# Patient Record
Sex: Female | Born: 1960 | Race: White | Hispanic: No | Smoking: Former smoker
Health system: Southern US, Community
[De-identification: ages and names within clinical notes are randomized; demographics above are authoritative.]

## PROBLEM LIST (undated history)

## (undated) ENCOUNTER — Ambulatory Visit: Payer: PRIVATE HEALTH INSURANCE | Attending: Podiatrist | Primary: Podiatrist

## (undated) ENCOUNTER — Encounter: Attending: Family | Primary: Family

## (undated) ENCOUNTER — Ambulatory Visit: Payer: MEDICAID

## (undated) ENCOUNTER — Encounter

## (undated) ENCOUNTER — Ambulatory Visit: Payer: Medicaid (Managed Care) | Attending: Foot & Ankle Surgery | Primary: Foot & Ankle Surgery

## (undated) ENCOUNTER — Ambulatory Visit: Payer: PRIVATE HEALTH INSURANCE | Attending: Family | Primary: Family

## (undated) ENCOUNTER — Ambulatory Visit: Payer: Medicaid (Managed Care)

## (undated) ENCOUNTER — Encounter: Attending: Urology | Primary: Urology

## (undated) ENCOUNTER — Encounter: Attending: Rheumatology | Primary: Rheumatology

## (undated) ENCOUNTER — Telehealth

## (undated) ENCOUNTER — Ambulatory Visit: Payer: MEDICAID | Attending: Foot & Ankle Surgery | Primary: Foot & Ankle Surgery

## (undated) ENCOUNTER — Ambulatory Visit

## (undated) ENCOUNTER — Telehealth: Payer: MEDICAID

## (undated) ENCOUNTER — Ambulatory Visit: Payer: MEDICAID | Attending: Rheumatology | Primary: Rheumatology

## (undated) ENCOUNTER — Telehealth: Attending: Rheumatology | Primary: Rheumatology

## (undated) ENCOUNTER — Inpatient Hospital Stay

## (undated) ENCOUNTER — Ambulatory Visit: Payer: Medicaid (Managed Care) | Attending: Podiatrist | Primary: Podiatrist

## (undated) ENCOUNTER — Ambulatory Visit: Payer: Medicaid (Managed Care) | Attending: Rheumatology | Primary: Rheumatology

## (undated) ENCOUNTER — Telehealth
Attending: Student in an Organized Health Care Education/Training Program | Primary: Student in an Organized Health Care Education/Training Program

## (undated) ENCOUNTER — Ambulatory Visit: Payer: PRIVATE HEALTH INSURANCE | Attending: Hematology & Oncology | Primary: Hematology & Oncology

## (undated) ENCOUNTER — Ambulatory Visit: Payer: PRIVATE HEALTH INSURANCE

## (undated) ENCOUNTER — Ambulatory Visit: Payer: MEDICAID | Attending: Surgery | Primary: Surgery

## (undated) ENCOUNTER — Telehealth: Attending: Family | Primary: Family

## (undated) ENCOUNTER — Encounter: Attending: Internal Medicine | Primary: Internal Medicine

## (undated) ENCOUNTER — Ambulatory Visit
Payer: PRIVATE HEALTH INSURANCE | Attending: Student in an Organized Health Care Education/Training Program | Primary: Student in an Organized Health Care Education/Training Program

## (undated) ENCOUNTER — Ambulatory Visit: Payer: MEDICAID | Attending: Family | Primary: Family

## (undated) ENCOUNTER — Ambulatory Visit: Payer: MEDICAID | Attending: Adult Health | Primary: Adult Health

## (undated) ENCOUNTER — Encounter: Attending: Foot & Ankle Surgery | Primary: Foot & Ankle Surgery

## (undated) ENCOUNTER — Encounter
Attending: Student in an Organized Health Care Education/Training Program | Primary: Student in an Organized Health Care Education/Training Program

## (undated) ENCOUNTER — Ambulatory Visit: Payer: MEDICAID | Attending: Nurse Practitioner | Primary: Nurse Practitioner

## (undated) ENCOUNTER — Other Ambulatory Visit

## (undated) ENCOUNTER — Encounter
Attending: Pharmacist Clinician (PhC)/ Clinical Pharmacy Specialist | Primary: Pharmacist Clinician (PhC)/ Clinical Pharmacy Specialist

## (undated) ENCOUNTER — Ambulatory Visit: Payer: MEDICAID | Attending: Urology | Primary: Urology

## (undated) ENCOUNTER — Telehealth: Attending: Family Medicine | Primary: Family Medicine

## (undated) ENCOUNTER — Ambulatory Visit
Payer: Medicaid (Managed Care) | Attending: Rehabilitative and Restorative Service Providers" | Primary: Rehabilitative and Restorative Service Providers"

## (undated) ENCOUNTER — Encounter: Attending: Podiatrist | Primary: Podiatrist

## (undated) ENCOUNTER — Ambulatory Visit
Payer: Medicaid (Managed Care) | Attending: Student in an Organized Health Care Education/Training Program | Primary: Student in an Organized Health Care Education/Training Program

## (undated) ENCOUNTER — Telehealth: Attending: Foot & Ankle Surgery | Primary: Foot & Ankle Surgery

## (undated) ENCOUNTER — Ambulatory Visit: Payer: Medicaid (Managed Care) | Attending: "Endocrinology | Primary: "Endocrinology

## (undated) ENCOUNTER — Ambulatory Visit: Payer: MEDICAID | Attending: Internal Medicine | Primary: Internal Medicine

## (undated) ENCOUNTER — Encounter: Payer: Medicaid (Managed Care) | Attending: Family | Primary: Family

## (undated) ENCOUNTER — Telehealth: Payer: MEDICAID | Attending: Rheumatology | Primary: Rheumatology

## (undated) ENCOUNTER — Ambulatory Visit: Payer: Medicaid (Managed Care) | Attending: Family | Primary: Family

## (undated) ENCOUNTER — Ambulatory Visit
Payer: MEDICAID | Attending: Student in an Organized Health Care Education/Training Program | Primary: Student in an Organized Health Care Education/Training Program

## (undated) ENCOUNTER — Ambulatory Visit: Payer: PRIVATE HEALTH INSURANCE | Attending: "Endocrinology | Primary: "Endocrinology

## (undated) ENCOUNTER — Ambulatory Visit: Payer: MEDICAID | Attending: Vascular Surgery | Primary: Vascular Surgery

## (undated) ENCOUNTER — Telehealth: Attending: Ambulatory Care | Primary: Ambulatory Care

## (undated) ENCOUNTER — Inpatient Hospital Stay: Payer: Medicaid (Managed Care)

## (undated) ENCOUNTER — Encounter: Attending: Vascular Surgery | Primary: Vascular Surgery

## (undated) ENCOUNTER — Other Ambulatory Visit: Payer: Medicaid (Managed Care)

## (undated) ENCOUNTER — Ambulatory Visit: Payer: MEDICAID | Attending: "Endocrinology | Primary: "Endocrinology

## (undated) ENCOUNTER — Ambulatory Visit: Payer: MEDICAID | Attending: Registered" | Primary: Registered"

## (undated) ENCOUNTER — Telehealth: Attending: Internal Medicine | Primary: Internal Medicine

## (undated) ENCOUNTER — Ambulatory Visit: Payer: MEDICAID | Attending: Infectious Disease | Primary: Infectious Disease

## (undated) ENCOUNTER — Encounter: Attending: Nutritionist | Primary: Nutritionist

## (undated) DIAGNOSIS — F331 Major depressive disorder, recurrent, moderate: Secondary | ICD-10-CM

## (undated) DIAGNOSIS — K449 Diaphragmatic hernia without obstruction or gangrene: Secondary | ICD-10-CM

## (undated) DIAGNOSIS — E669 Obesity, unspecified: Secondary | ICD-10-CM

## (undated) DIAGNOSIS — K439 Ventral hernia without obstruction or gangrene: Secondary | ICD-10-CM

## (undated) DIAGNOSIS — N319 Neuromuscular dysfunction of bladder, unspecified: Secondary | ICD-10-CM

## (undated) DIAGNOSIS — U071 COVID-19: Secondary | ICD-10-CM

## (undated) DIAGNOSIS — K219 Gastro-esophageal reflux disease without esophagitis: Secondary | ICD-10-CM

## (undated) DIAGNOSIS — I5032 Chronic diastolic (congestive) heart failure: Secondary | ICD-10-CM

## (undated) DIAGNOSIS — E876 Hypokalemia: Secondary | ICD-10-CM

## (undated) DIAGNOSIS — E039 Hypothyroidism, unspecified: Secondary | ICD-10-CM

## (undated) DIAGNOSIS — I10 Essential (primary) hypertension: Secondary | ICD-10-CM

## (undated) DIAGNOSIS — F32A Depression, unspecified: Secondary | ICD-10-CM

## (undated) DIAGNOSIS — R609 Edema, unspecified: Secondary | ICD-10-CM

## (undated) DIAGNOSIS — A0472 Enterocolitis due to Clostridium difficile, not specified as recurrent: Secondary | ICD-10-CM

## (undated) DIAGNOSIS — F410 Panic disorder [episodic paroxysmal anxiety] without agoraphobia: Secondary | ICD-10-CM

## (undated) DIAGNOSIS — J1282 Pneumonia due to coronavirus disease 2019: Secondary | ICD-10-CM

## (undated) DIAGNOSIS — A4902 Methicillin resistant Staphylococcus aureus infection, unspecified site: Secondary | ICD-10-CM

## (undated) DIAGNOSIS — G629 Polyneuropathy, unspecified: Secondary | ICD-10-CM

## (undated) DIAGNOSIS — F329 Major depressive disorder, single episode, unspecified: Secondary | ICD-10-CM

## (undated) DIAGNOSIS — F419 Anxiety disorder, unspecified: Secondary | ICD-10-CM

## (undated) DIAGNOSIS — G4730 Sleep apnea, unspecified: Secondary | ICD-10-CM

## (undated) DIAGNOSIS — K589 Irritable bowel syndrome without diarrhea: Secondary | ICD-10-CM

## (undated) DIAGNOSIS — I5030 Unspecified diastolic (congestive) heart failure: Secondary | ICD-10-CM

## (undated) DIAGNOSIS — M069 Rheumatoid arthritis, unspecified: Secondary | ICD-10-CM

## (undated) DIAGNOSIS — M1990 Unspecified osteoarthritis, unspecified site: Secondary | ICD-10-CM

## (undated) HISTORY — DX: Pneumonia due to coronavirus disease 2019: J12.82

## (undated) HISTORY — DX: Methicillin resistant Staphylococcus aureus infection, unspecified site: A49.02

## (undated) HISTORY — PX: ABDOMINAL HYSTERECTOMY: SHX81

## (undated) HISTORY — PX: EYE SURGERY: SHX253

## (undated) HISTORY — DX: Unspecified diastolic (congestive) heart failure: I50.30

## (undated) HISTORY — DX: Rheumatoid arthritis, unspecified: M06.9

## (undated) HISTORY — PX: TUBAL LIGATION: SHX77

## (undated) HISTORY — DX: Major depressive disorder, recurrent, moderate: F33.1

## (undated) HISTORY — DX: Gastro-esophageal reflux disease without esophagitis: K21.9

## (undated) HISTORY — DX: Enterocolitis due to Clostridium difficile, not specified as recurrent: A04.72

## (undated) HISTORY — DX: Neuromuscular dysfunction of bladder, unspecified: N31.9

## (undated) HISTORY — DX: Ventral hernia without obstruction or gangrene: K43.9

## (undated) HISTORY — DX: Depression, unspecified: F32.A

## (undated) HISTORY — DX: Morbid (severe) obesity due to excess calories: E66.01

## (undated) HISTORY — DX: Diaphragmatic hernia without obstruction or gangrene: K44.9

## (undated) HISTORY — DX: Panic disorder (episodic paroxysmal anxiety): F41.0

## (undated) HISTORY — PX: HERNIA REPAIR: SHX51

## (undated) HISTORY — DX: Polyneuropathy, unspecified: G62.9

## (undated) HISTORY — PX: CHOLECYSTECTOMY: SHX55

## (undated) HISTORY — DX: Sleep apnea, unspecified: G47.30

## (undated) HISTORY — DX: Unspecified osteoarthritis, unspecified site: M19.90

## (undated) HISTORY — DX: Chronic diastolic (congestive) heart failure: I50.32

## (undated) HISTORY — DX: COVID-19: U07.1

## (undated) HISTORY — DX: Essential (primary) hypertension: I10

## (undated) HISTORY — DX: Irritable bowel syndrome, unspecified: K58.9

## (undated) HISTORY — DX: Major depressive disorder, single episode, unspecified: F32.9

## (undated) HISTORY — DX: Hypokalemia: E87.6

## (undated) HISTORY — DX: Edema, unspecified: R60.9

## (undated) HISTORY — DX: Anxiety disorder, unspecified: F41.9

## (undated) HISTORY — DX: Obesity, unspecified: E66.9

## (undated) HISTORY — PX: TONSILLECTOMY: SUR1361

## (undated) MED ORDER — HELIUM-OXYGEN INHL: NASAL | 0.00000 days | Status: SS

## (undated) MED ORDER — PRAMIPEXOLE 0.125 MG TABLET: ORAL | 0.00000 days

## (undated) MED ORDER — INSULIN LISPRO (U-100) 100 UNIT/ML SUBCUTANEOUS SOLUTION: Freq: Three times a day (TID) | SUBCUTANEOUS | 0.00000 days

## (undated) MED ORDER — CALCIUM CITRATE 315 MG-VITAMIN D3 5 MCG (200 UNIT) TABLET: Freq: Two times a day (BID) | ORAL | 0 days | Status: SS

## (undated) MED ORDER — OXYCODONE 5 MG TABLET: Freq: Four times a day (QID) | ORAL | 0 days | Status: SS | PRN

## (undated) MED ORDER — VANCOMYCIN IVPB IN 100 ML: INTRAVENOUS | 0.00000 days | Status: SS

---
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
## 2004-04-08 ENCOUNTER — Ambulatory Visit: Payer: Self-pay | Admitting: Urology

## 2004-06-03 DIAGNOSIS — F331 Major depressive disorder, recurrent, moderate: Secondary | ICD-10-CM

## 2004-06-03 HISTORY — DX: Major depressive disorder, recurrent, moderate: F33.1

## 2004-07-07 ENCOUNTER — Inpatient Hospital Stay: Payer: Self-pay | Admitting: Obstetrics & Gynecology

## 2004-11-12 ENCOUNTER — Other Ambulatory Visit: Payer: Self-pay

## 2004-11-19 ENCOUNTER — Ambulatory Visit: Payer: Self-pay | Admitting: Urology

## 2004-12-03 ENCOUNTER — Ambulatory Visit: Payer: Self-pay | Admitting: Urology

## 2004-12-27 ENCOUNTER — Emergency Department: Payer: Self-pay | Admitting: Emergency Medicine

## 2005-01-14 ENCOUNTER — Ambulatory Visit: Payer: Self-pay | Admitting: Urology

## 2005-01-26 ENCOUNTER — Emergency Department: Payer: Self-pay | Admitting: Emergency Medicine

## 2005-01-27 DIAGNOSIS — I10 Essential (primary) hypertension: Secondary | ICD-10-CM | POA: Insufficient documentation

## 2005-03-29 DIAGNOSIS — E669 Obesity, unspecified: Secondary | ICD-10-CM | POA: Insufficient documentation

## 2005-08-17 ENCOUNTER — Ambulatory Visit (HOSPITAL_COMMUNITY): Admission: RE | Admit: 2005-08-17 | Discharge: 2005-08-17 | Payer: Self-pay | Admitting: Urology

## 2005-10-08 ENCOUNTER — Ambulatory Visit: Payer: Self-pay | Admitting: Internal Medicine

## 2006-06-24 ENCOUNTER — Ambulatory Visit (HOSPITAL_COMMUNITY): Admission: RE | Admit: 2006-06-24 | Discharge: 2006-06-24 | Payer: Self-pay | Admitting: Internal Medicine

## 2006-07-01 ENCOUNTER — Emergency Department: Payer: Self-pay | Admitting: Emergency Medicine

## 2006-07-05 ENCOUNTER — Ambulatory Visit: Payer: Self-pay | Admitting: Internal Medicine

## 2006-08-11 ENCOUNTER — Ambulatory Visit: Payer: Self-pay | Admitting: Internal Medicine

## 2006-10-18 ENCOUNTER — Ambulatory Visit (HOSPITAL_COMMUNITY): Admission: RE | Admit: 2006-10-18 | Discharge: 2006-10-18 | Payer: Self-pay | Admitting: Surgery

## 2006-10-25 ENCOUNTER — Encounter: Admission: RE | Admit: 2006-10-25 | Discharge: 2007-01-23 | Payer: Self-pay | Admitting: Surgery

## 2006-10-31 ENCOUNTER — Ambulatory Visit (HOSPITAL_COMMUNITY): Admission: RE | Admit: 2006-10-31 | Discharge: 2006-10-31 | Payer: Self-pay | Admitting: Surgery

## 2006-11-09 ENCOUNTER — Encounter: Admission: RE | Admit: 2006-11-09 | Discharge: 2006-11-09 | Payer: Self-pay | Admitting: Surgery

## 2007-03-08 ENCOUNTER — Encounter: Admission: RE | Admit: 2007-03-08 | Discharge: 2007-06-06 | Payer: Self-pay | Admitting: Surgery

## 2007-03-20 ENCOUNTER — Ambulatory Visit (HOSPITAL_COMMUNITY): Admission: RE | Admit: 2007-03-20 | Discharge: 2007-03-21 | Payer: Self-pay | Admitting: Surgery

## 2007-03-20 HISTORY — PX: LAPAROSCOPIC GASTRIC BANDING: SHX1100

## 2007-03-22 ENCOUNTER — Inpatient Hospital Stay (HOSPITAL_COMMUNITY): Admission: EM | Admit: 2007-03-22 | Discharge: 2007-03-27 | Payer: Self-pay | Admitting: Surgery

## 2007-05-04 ENCOUNTER — Ambulatory Visit: Payer: Self-pay | Admitting: Internal Medicine

## 2007-05-25 ENCOUNTER — Encounter: Admission: RE | Admit: 2007-05-25 | Discharge: 2007-05-25 | Payer: Self-pay | Admitting: Surgery

## 2007-09-29 ENCOUNTER — Ambulatory Visit: Payer: Self-pay | Admitting: Family Medicine

## 2007-10-04 ENCOUNTER — Ambulatory Visit: Payer: Self-pay | Admitting: Family Medicine

## 2008-01-18 ENCOUNTER — Ambulatory Visit (HOSPITAL_COMMUNITY): Admission: RE | Admit: 2008-01-18 | Discharge: 2008-01-18 | Payer: Self-pay | Admitting: Surgery

## 2009-03-15 ENCOUNTER — Ambulatory Visit: Payer: Self-pay | Admitting: Internal Medicine

## 2009-06-13 ENCOUNTER — Ambulatory Visit: Payer: Self-pay | Admitting: Internal Medicine

## 2010-01-02 ENCOUNTER — Emergency Department: Payer: Self-pay | Admitting: Emergency Medicine

## 2010-01-19 ENCOUNTER — Emergency Department: Payer: Self-pay | Admitting: Emergency Medicine

## 2010-05-10 ENCOUNTER — Encounter: Payer: Self-pay | Admitting: Surgery

## 2010-05-21 ENCOUNTER — Ambulatory Visit
Admission: RE | Admit: 2010-05-21 | Discharge: 2010-05-21 | Disposition: A | Discharge status: Home / Self Care | Payer: BC Managed Care – PPO | Source: Ambulatory Visit | Attending: Internal Medicine | Admitting: Internal Medicine

## 2010-05-21 ENCOUNTER — Other Ambulatory Visit: Payer: Self-pay | Admitting: Internal Medicine

## 2010-05-21 MED ORDER — IOHEXOL 300 MG/ML  SOLN: 120.0000 mL | Freq: Once | INTRAMUSCULAR | Status: AC | PRN

## 2010-06-10 ENCOUNTER — Ambulatory Visit: Payer: Self-pay | Admitting: Urology

## 2010-06-23 ENCOUNTER — Ambulatory Visit: Payer: Self-pay | Admitting: Urology

## 2010-06-27 ENCOUNTER — Emergency Department: Payer: Self-pay | Admitting: Emergency Medicine

## 2010-06-28 DIAGNOSIS — E039 Hypothyroidism, unspecified: Secondary | ICD-10-CM | POA: Insufficient documentation

## 2010-06-28 DIAGNOSIS — M069 Rheumatoid arthritis, unspecified: Secondary | ICD-10-CM | POA: Insufficient documentation

## 2010-06-28 DIAGNOSIS — M0579 Rheumatoid arthritis with rheumatoid factor of multiple sites without organ or systems involvement: Secondary | ICD-10-CM | POA: Insufficient documentation

## 2010-09-01 NOTE — Op Note (Signed)
NAMESKYLAN, GIFT NO.:  [id number]   MEDICAL RECORD NO.:  [id number]          PATIENT TYPE:  OIB   LOCATION:  0098                         FACILITY:  [facility]   PHYSICIAN:  Sandria Bales. Ezzard Standing, M.D.  DATE OF BIRTH:  Nov 11, 1960   DATE OF PROCEDURE:  03/20/2007  DATE OF DISCHARGE:                               OPERATIVE REPORT   PREOPERATIVE DIAGNOSIS:  Morbid obesity.  Weight 337, body mass index  53.   POSTOPERATIVE DIAGNOSIS:  Morbid obesity.  Weight 337, body mass index  53.   PROCEDURE:  Laparoscopic band placement with APS system.   SURGEON:  Sandria Bales. Ezzard Standing, M.D.   FIRST ASSISTANT:  Sharlet Salina T. Hoxworth, M.D.   ANESTHESIA:  General endotracheal anesthesia.   ESTIMATED BLOOD LOSS:  Minimal.   INDICATIONS FOR PROCEDURE:  [name] is a 50 year old white female who  has been through our preoperative bariatric program.  She is a patient  of Dr. Eilleen Kempf, her primary medical doctor.  She has multiple  comorbid problems associated with obesity which includes hyperlipidemia,  hypertension, sleep apnea, gastroesophageal reflux disease, non-insulin-  dependent diabetes mellitus, and depression.   The patient is interested in a lap band and indications and potential  complications of the procedure were explained to the patient.  Potential  complications include but are not limited to bleeding, infection, bowel  injury, slippage of the band, erosion of the band and long-term  nutritional consequences.   DESCRIPTION OF PROCEDURE:  Patient given a general endotracheal  anesthetic.  She was given 1 g Ancef initially at the procedure.  She  had PAS stockings in place.  Her abdomen was prepped with Betadine  solution and sterilely draped.   A Foley catheter was placed.  She says she has interstitial cystitis and  was worried about bladder problems while aslepp and has a bladder  pacemaker.   A time-out was held identifying the patient and procedure.   I  accessed the abdominal cavity with a 12 mm Ethicon Optiview in the  left upper quadrant.  I then placed five additional trocars, a 5 mm  subxiphoid for the liver retractor, a 15 mm right subcostal for the port  insertion, an 11 mm right paramedian, an 11 mm left paramedian, a 5 mm  left lateral subcostal trocar.   Abdominal exploration was carried out.  Right and left lobes of the  liver were unremarkable.  I spent about 15 minutes taking down adhesions  to the anterior abdominal wall of the upper abdomen.  She also had  adhesions of the lower abdomen where her scar was, I did not try to take  those down.  Her anterior wall of her stomach was unremarkable.   I then placed a liver retractor, the Nathanson retractor, under the left  lobe of the liver and lip and identified the gastroesophageal junction.   I opened up the angle of Hiss and then went along the lesser curvature  down where the fibers cross the right crus and passed the finger  dissector posterior to the stomach.  I then inserted  an APS band, passed  that around the proximal stomach and passed the sizer down.  The band  was then closed with the sizer and the sizer removed.   I then placed three sutures of 0 Ethibond suture to imbricate the band  in position.   After the band was in good position, I took photos which are included in  the chart.  The band lay flat.  I did not see much in the way of a  hiatal hernia, at least from below and did not try to explore much from  above.   I then removed the tubing out through the right paramedian incision.  I  removed the Nathanson retractor, removed the trocar in turn.  There was  no bleeding at the trocar site.  I then enlarged the right paramedian  incision where I attached the Silastic device to the reservoir and sewed  the reservoir in place with 0 Prolene sutures.   I then closed the subcutaneous tissues with 2-0 Vicryl suture.  I  infiltrated the subcutaneous tissues and  skin with approximately 3 mL of  half strength Marcaine with epinephrine.  I then closed each skin site  with a running 5-0 Monocryl suture and then painted each wound with  Dermabond.   The patient tolerated the procedure well and was transported to the  recovery room in good condition.  Sponge and needle counts were reported  correct at the end of the case.      Sandria Bales. Ezzard Standing, M.D.  Electronically Signed     DHN/MEDQ  D:  03/20/2007  T:  03/20/2007  Job:  [id number]   cc:   Kari Baars, M.D.  Fax: [phone]

## 2010-09-01 NOTE — Discharge Summary (Signed)
NAMETYRONE, BALASH NO.:  [id number]   MEDICAL RECORD NO.:  [id number]          PATIENT TYPE:  INP   LOCATION:  1525                         FACILITY:  [facility]   PHYSICIAN:  Sandria Bales. Ezzard Standing, M.D.  DATE OF BIRTH:  Aug 29, 1960   DATE OF ADMISSION:  03/22/2007  DATE OF DISCHARGE:  03/27/2007                               DISCHARGE SUMMARY   DISCHARGE DIAGNOSES:  1. Postop swelling around lap band causing nausea and vomiting.  2. Status post lap band placement for morbid obesity.  3. Hyperlipidemia.  4. Hypertension.  5. Sleep apnea but cannot tolerate CPAP.  6. Gastroesophageal reflux disease.  7. History of bladder pacemaker placed by Dr. Lorin Picket McDiarmid.  8. Dermatoid disease.  9. Non-insulin-dependent diabetes mellitus.  10.Hypothyroid.  11.History of depression.  12.History of restless leg syndrome.   OPERATION PERFORMED:  None.   HISTORY OF ILLNESS:  [name] [name] is a 50 year old white female who is a  patient of Dr. Eric Form who has completed our bariatric preop program  for consideration of lap band.  Her preop weight was 337 pounds with a  height of 5 feet 7 inches.  Her BMI count was 52.97.   She underwent a lap band placement on March 20, 2007, at [facility].  She was kept in the ICU overnight because of sleep  apnea, but did well, tolerating liquids and protein drink and was  discharged home with the first postop day.   However, that evening March 21, 2007, and the morning of March 22, 2007, she developed severe nausea, vomiting, retching and had trouble  keeping anything down.   She was then readmitted to the hospital for evaluation of her band,  management of dehydration.   PAST MEDICAL HISTORY:  As outlined in her history of present illness.   Physical examination  VITAL SIGNS:  ON admission, her blood pressure 111/53, pulse 87,  temperature 99.  ABDOMEN: Was actually fairly benign, but she did complain of  epigastric  tenderness, particularly when trying to drink anything.  She had normal  bowel sounds.  Her incisions looked good.   Her CBC on admission showed a white blood count of 8100, hemoglobin  10.4, hematocrit 31.3.  she was admitted to the hospital, placed on IV  fluids and slowly got better.  On December 5, I obtained an upper GI  swallow with gastrograph and this showed some esophageal spasms, but  emptying of her pouch slowly.  She slowly continued to get better,  tolerating more protein drink and liquids with less pain every day.  She  is now on her fifth day of hospitalization.  She is tolerating the  liquids and keeping this down.  Her abdomen is soft.  She is afebrile,  and she is ready for discharge.   DISCHARGE INSTRUCTIONS:  She will resume her home medication.  Her  medications include:  1. Zoloft  2. Trimethoprim.  3. Glipizide.  4. Clonazepam.  5. Lisinopril hydrochlorothiazide.  6. Levothyroxine.  7. Metformin.  8. Mirapex.   She will stay just on  clear liquids with protein drink diet until she  sees me back in the office.  She knows to increase her exercise.  She is  not working right now, so her primary concern ought to be trying to get  exercise in.   CONDITION ON DISCHARGE:  Good.      Sandria Bales. Ezzard Standing, M.D.  Electronically Signed     DHN/MEDQ  D:  03/27/2007  T:  03/27/2007  Job:  [id number]   cc:   Kari Baars, M.D.  Fax: [phone]

## 2010-09-01 NOTE — H&P (Signed)
NAMETREVOR, DUTY NO.:  [id number]   MEDICAL RECORD NO.:  [id number]          PATIENT TYPE:  INP   LOCATION:  1525                         FACILITY:  [facility]   PHYSICIAN:  Sandria Bales. Ezzard Standing, M.D.  DATE OF BIRTH:  1960-06-10   DATE OF ADMISSION:  03/22/2007  DATE OF DISCHARGE:                              HISTORY & PHYSICAL   HISTORY:  [name] is a 50 year old white female who is a patient of Dr.  Eric Form, who had been through our bariatric program for morbid obesity  for evaluation for lap band placement.  Preoperatively, her weight was  337 pounds.  Her height 5 feet 7 inches.  Her BMI 52.97.   She underwent a laparoscopic banding procedure on March 20, 2007, at  [facility].  She was kept in the ICU overnight because of  sleep apnea, but did very well, tolerated liquids and her protein drink,  and went home the first day after surgery.  However after going home,  the evening of March 21, 2007 and into the morning of March 22, 2007, she had severe nausea, vomiting, retching and was having trouble  keeping anything down.   She contacted me the morning of March 22, 2007.  I thought she would  be best served by being re-admitted to the hospital for evaluation of  her band.   ALLERGIES:  1. CODEINE.  2. SULFA.   CURRENT MEDICATIONS:  1. Zoloft 100 mg b.i.d.  2. Trimethoprim 100 mg daily.  3. Glipizide 10 mg daily.  4. Clonazepam 0.5 mg twice a day.  5. Lisinopril/hydrochlorothiazide 20/25 mg daily.  6. Levothyroxine 50 mcg daily.  7. Metformin 1,000 mg b.i.d.  8. Mirapex 1 mg daily.   REVIEW OF SYSTEMS:  NEUROLOGIC:  No seizure or loss of consciousness.  PULMONARY:  She has sleep apnea.  She has tried CPAP, but it gave her  kind of a smothering effect, so she is not able to tolerate her CPAP.  CARDIAC:  She has been hypertensive for a number of years, but she has  had no chest pain.  No angina.  No prior cardiac catheterization.   She  says she has hyperlipidemia, but this has just been observed and I do  not plan any medication.  GASTROINTESTINAL:  She has seen Dr. Yancey Flemings in the past.  She has a  history of gastroesophageal reflux, but no history of liver disease.  She had a laparoscopic cholecystectomy done at [facility] in 2000.  UROLOGIC:  She has a bladder pacemaker placed by Dr. Lorin Picket MacDiarmid  about 3 years ago.  That seemed to work, but recently, she is not really  sure it is working very well.  She had a hysterectomy in 2003 for benign  disease by Dr. Barnabas Lister in [place].  This was done vaginally.  MUSCULOSKELETAL:  She has arthritis in both her knees and elbows.  ENDOCRINE:  She has been diabetic for about 5 years on oral  hyperglycemics.  She has been hypothyroid for just a few months and  placed on  thyroid medicine.  PSYCHIATRIC:  She has had a history of  depression since 1994 and saw a psychiatrist, Dr. Myrtis Ser.  She also says  she has restless leg syndrome.   PHYSICAL EXAMINATION:  VITAL SIGNS:  Blood pressure 111/53, pulse 67,  temperature 99.  HEENT:  Unremarkable.  NECK:  Supple.  I found no mass.  LUNGS:  Clear to auscultation.  HEART:  Regular rate and rhythm without murmur.  ABDOMEN:  She complains of epigastric tenderness, but has no guarding.  No rebound.  Has normal bowel sounds.  All her incisions actually look  pretty good to me.  EXTREMITIES:  Good strength in all four extremities.  NEUROLOGIC:  Grossly intact.   IMPRESSION:  1. Nausea and vomiting post laparoscopic band placement.  We will plan      IV hydration overnight.  Check her labs.  Give her a day or two to      cool off.  If she does not open up, we will plan an upper GI.  If the band opens up, she will possibly be discharged home.  I went over  all this with her.  1. Hyperlipidemia.  2. Hypertension.  3. Sleep apnea, but cannot tolerate continuous positive airway      pressure.  4. Gastroesophageal reflux  disease.  5. History of bladder pacemaker followed by Dr. Alfredo Martinez.  6. Degenerative joint disease.  7. Noninsulin-dependent diabetes mellitus.  8. Hypothyroid on medicine.  9. History of depression.  10.Restless leg syndrome.      Sandria Bales. Ezzard Standing, M.D.  Electronically Signed     DHN/MEDQ  D:  03/24/2007  T:  03/24/2007  Job:  [id number]   cc:   Kari Baars, M.D.  Fax: [phone]

## 2010-09-04 NOTE — Op Note (Signed)
[name], [name] [name]                ACCOUNT NO.:  [id number]   MEDICAL RECORD NO.:  [id number]          PATIENT TYPE:  AMB   LOCATION:  DAY                          FACILITY:  [facility]   PHYSICIAN:  Martina Sinner, MD DATE OF BIRTH:  [birth year]   DATE OF PROCEDURE:  08/17/2005  DATE OF DISCHARGE:                                 OPERATIVE REPORT   RETROGRADE URETEROGRAMS:  Using a 6 French open ended catheter, I did  bilateral retrograde ureterograms.  There was no hydroureter.  The renal  pelvis was normal.  The calices bilaterally were normal.  There was no  fistula between the ureter or collecting system and bowel.           ______________________________  Martina Sinner, MD  Electronically Signed     SAM/MEDQ  D:  08/17/2005  T:  08/18/2005  Job:  [id number]

## 2010-09-04 NOTE — Op Note (Signed)
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   POSTOPERATIVE DIAGNOSIS:  Urinary tract infections, pelvic pain, urinary  incontinence.   OPERATION PERFORMED:  Cystoscopy, bilateral retrograde ureterograms,  hydrodistention, bladder instillation therapy.   SURGEON:  Martina Sinner, MD   ANESTHESIA:   INDICATIONS FOR PROCEDURE:  [name] [name] history has been previously well  documented.  She had a negative CT cystogram.  We are looking for a fistula.  I also want to sort out whether or not she may have interstitial cystitis.   DESCRIPTION OF PROCEDURE:  The patient was prepped and draped in the usual  fashion.  She was given antibiotics prior to the procedure.  Her urinalysis  was negative prior to the procedure.   The patient was prepped and draped in the usual fashion.  I initially  cystoscoped the patient and she had no erythema of the bladder mucosa.  The  bladder mucosa looked normal.  She did not require any bladder biopsies.   Bilateral retrograde ureterograms.  I did under real time fluoroscopy  bilateral retrograde ureterograms using a 6 [place] open ended catheter.  I  was very pleased that both ureters and the renal pelvis was normal.  The  upper calix of the right kidney filled out to an acceptable degree.  She has  had a CT scan which has shown no renal abnormalities.  Her renal collecting  system, in my opinion, is within normal limits.  She was hydrodistended to  500 mL.  She would leak around the scope.  I did this twice.  On  reinspection  of the bladder, there was no glomerulations or findings in  keeping with a diagnosis of interstitial cystitis.  Red rubber catheter was  inserted and 15 mL of 21% Marcaine in combination with 400 mg of Pyridium  was instilled in the empty bladder at the end of the case.  The procedure  was well tolerated.  The patient was then transferred to the recovery room.           ______________________________  Martina Sinner, MD  Electronically Signed     SAM/MEDQ  D:  08/17/2005  T:  08/18/2005  Job:  [id number]

## 2010-09-04 NOTE — Assessment & Plan Note (Signed)
[name] [name]                         GASTROENTEROLOGY OFFICE NOTE   NAME:[name] [name] WIERENGA                       MRN:          [id number]  DATE:07/05/2006                            DOB:          Jun 24, 1960    REFERRING PHYSICIAN:  Kari [name], M.D.   REASON FOR CONSULTATION:  Nausea and vomiting, diarrhea and rectal  bleeding.   HISTORY:  This is a 50 year old female with a history of type 2 diabetes  mellitus, hypertension, morbid obesity, and recently diagnosed  hypothyroidism.  She is referred today through the courtesy of Dr. Clelia [name]  regarding complaints of chronic nausea, vomiting, diarrhea and new onset  rectal bleeding.  She was diagnosed in June of 2007 for similar  complaints.  However, no complaints of bleeding at that time.  See that  dictation for details.  She was felt to have irritable bowel syndrome  and reflux disease.  Laboratory testing for celiac sprue was negative.  Stool studies for fat were normal.  Her sedimentation rate was  moderately elevated at 65.  Stool for white blood cells was negative.  She really reports the same symptoms as she did at time of her last  visit.  Problems with nausea and occasional vomiting.  This can occur  different times of the day.  She has sort of a water brash type  sensation though no pyrosis.  [employer] gastric emptying scan  performed June 24, 2006 was normal, with no evidence of gastroparesis.  Her TSH was elevated and she was recently started on Synthroid about a  week ago.  At the time of her last evaluation it was recommended that  she undergo colonoscopy.  She cancelled the procedure due to insurance-  related costs.  She was also placed on Nexium which she took for about 6  months.  She states this did not help her nausea.  She was also placed  on Imodium which she says did help her diarrhea though incompletely.  She has had no other interval problems.  She recently injured her back  and is on Robaxin and Percocet.  She is accompanied today by her friend.  She has had no weight loss.   DRUG ALLERGIES OR INTOLERANCES:  CODEINE, SULFA, PENICILLIN.   CURRENT MEDICATIONS:  1. Zoloft 200 mg daily.  2. Trimethoprim 100 mg daily.  3. Glipizide 5 mg b.i.d.  4. Clonazepam 0.5 mg at night.  5. Lisinopril/hydrochlorothiazide 25 mg daily.  6. Levothyroxine 50 mcg daily.  7. Metformin 1 gram b.i.d.  8. Mirapex 1 mg at night.  9. She is also using Robaxin and Percocet as needed for back pain.   FAMILY HISTORY:  Per previous evaluation.   SOCIAL HISTORY:  Per previous evaluation.   PHYSICAL EXAMINATION:  The patient is alert and oriented.  She is a bit  uncomfortable with her back though in no acute distress.  Blood pressure  is 128/76, heart rate 78, weight is 335 pounds (increased 7 pounds).  HEENT:  Sclerae anicteric.  Oral mucosa intact.  LUNGS:  Clear.  HEART:  Regular.  The patient could  not lie back for her abdominal exam  but in a sitting position her abdomen is obese and soft, without  significant tenderness.  EXTREMITIES:  Without obvious edema.  RECTAL EXAM:  Deferred.  At the time of her last visit, rectal exam  revealed small external hemorrhoids with hemoccult negative stool.   IMPRESSION:  1. Chronic problems with urgency associated with lower abdominal      cramping and loose stools.  Most likely irritable bowel.  2. Intermittent rectal bleeding.  New problem.  Possibly due to      hemorrhoids.  Rule out intracolonic lesion.  Rule out colitis.  3. Chronic nausea and vomiting.  No evidence of gastroparesis on      recent gastric emptying scan.  Still suspect she may have      regurgitation related to reflux which is exacerbated by her      obesity.  4. Hypothyroidism.  Recent treatment initiated.  5. Other general medical problems including diabetes.  Discussed      above.   RECOMMENDATIONS:  1. Schedule colonoscopy with polypectomy if necessary, as  well colonic      biopsies.  The nature of the procedure as well as the risks,      benefits and alternatives were reviewed.  She understood and agreed      to proceed.  The patient specifically requested Osmoprep.  2. Upper endoscopy to evaluate chronic nausea and vomiting.  The      nature of the procedure as well as the risks, benefits and      alternatives have been reviewed.  She understood and agreed to      proceed.  3. Hold oral diabetic medications the day of her procedure prior to      the procedure.  4. Consider Lomotil for diarrhea if endoscopic evaluation negative.  5. Ongoing general medical care Dr. Clelia [name].     [name] [name]. [name] Goodell, MD  Electronically Signed    JNP/MedQ  DD: 07/05/2006  DT: 07/05/2006  Job #: [id number]   cc:   [name] [name], M.D.

## 2010-11-28 ENCOUNTER — Emergency Department: Payer: Self-pay | Admitting: Unknown Physician Specialty

## 2010-12-04 ENCOUNTER — Emergency Department: Payer: Self-pay | Admitting: Emergency Medicine

## 2011-01-25 LAB — BASIC METABOLIC PANEL
BUN: 6
CO2: 27
Calcium: 9.1
Chloride: 101
Creatinine, Ser: 0.59
GFR calc Af Amer: >60
GFR calc non Af Amer: >60
Glucose, Bld: 95
Potassium: 3.5
Sodium: 136

## 2011-01-25 LAB — CBC
HCT: 31.3 — ABNORMAL LOW
HCT: 32.3 — ABNORMAL LOW
Hemoglobin: 10.4 — ABNORMAL LOW
Hemoglobin: 10.7 — ABNORMAL LOW
MCHC: 33.3
MCHC: 33.3
MCV: 72.5 — ABNORMAL LOW
MCV: 72.6 — ABNORMAL LOW
Platelets: 311
Platelets: 317
RBC: 4.32
RBC: 4.45
RDW: 17.4 — ABNORMAL HIGH
RDW: 17.6 — ABNORMAL HIGH
WBC: 7.3
WBC: 8.1

## 2011-01-25 LAB — DIFFERENTIAL
Basophils Absolute: 0
Basophils Absolute: 0
Basophils Relative: 0
Basophils Relative: 1
Eosinophils Absolute: 0 — ABNORMAL LOW
Eosinophils Absolute: 0 — ABNORMAL LOW
Eosinophils Relative: 0
Eosinophils Relative: 0
Lymphocytes Relative: 17
Lymphocytes Relative: 27
Lymphs Abs: 1.2
Lymphs Abs: 2.1
Monocytes Absolute: 0.3
Monocytes Absolute: 0.4
Monocytes Relative: 4
Monocytes Relative: 5
Neutro Abs: 5.5
Neutro Abs: 5.7
Neutrophils Relative %: 68
Neutrophils Relative %: 79 — ABNORMAL HIGH

## 2011-01-26 LAB — BASIC METABOLIC PANEL
BUN: 19
CO2: 30
Calcium: 9.7
Chloride: 100
Creatinine, Ser: 0.7
GFR calc Af Amer: >60
GFR calc non Af Amer: >60
Glucose, Bld: 104 — ABNORMAL HIGH
Potassium: 4.4
Sodium: 137

## 2011-01-26 LAB — URINALYSIS, ROUTINE W REFLEX MICROSCOPIC
Bilirubin Urine: NEGATIVE
Glucose, UA: NEGATIVE
Hgb urine dipstick: NEGATIVE
Ketones, ur: NEGATIVE
Nitrite: NEGATIVE
Protein, ur: NEGATIVE
Specific Gravity, Urine: 1.017
Urobilinogen, UA: 0.2
pH: 5.5

## 2011-01-26 LAB — HEMOGLOBIN AND HEMATOCRIT, BLOOD
HCT: 35.4 — ABNORMAL LOW
Hemoglobin: 11.9 — ABNORMAL LOW

## 2011-07-30 ENCOUNTER — Telehealth (INDEPENDENT_AMBULATORY_CARE_PROVIDER_SITE_OTHER): Payer: Self-pay

## 2011-07-30 NOTE — Telephone Encounter (Signed)
I called the pt and gave her an appt for 4/17.  She would like to see Dr Ezzard Standing for evaluation of her lap band for possible removal.

## 2011-08-02 ENCOUNTER — Emergency Department: Payer: Self-pay | Admitting: Emergency Medicine

## 2011-08-02 LAB — URINALYSIS, COMPLETE
Bilirubin,UR: NEGATIVE
Blood: NEGATIVE
Glucose,UR: NEGATIVE mg/dL (ref 0–75)
Ketone: NEGATIVE
Nitrite: POSITIVE
Ph: 6 (ref 4.5–8.0)
Protein: NEGATIVE
RBC,UR: 2 /HPF (ref 0–5)
Specific Gravity: 1.015 (ref 1.003–1.030)
Squamous Epithelial: 3
WBC UR: 61 /HPF (ref 0–5)

## 2011-08-02 LAB — COMPREHENSIVE METABOLIC PANEL
Albumin: 3.3 g/dL — ABNORMAL LOW (ref 3.4–5.0)
Alkaline Phosphatase: 70 U/L (ref 50–136)
Anion Gap: 8 (ref 7–16)
BUN: 7 mg/dL (ref 7–18)
Bilirubin,Total: 0.3 mg/dL (ref 0.2–1.0)
Calcium, Total: 8.4 mg/dL — ABNORMAL LOW (ref 8.5–10.1)
Chloride: 105 mmol/L (ref 98–107)
Co2: 27 mmol/L (ref 21–32)
Creatinine: 0.66 mg/dL (ref 0.60–1.30)
EGFR (African American): >60
EGFR (Non-African Amer.): >60
Glucose: 86 mg/dL (ref 65–99)
Osmolality: 277 (ref 275–301)
Potassium: 3.4 mmol/L — ABNORMAL LOW (ref 3.5–5.1)
SGOT(AST): 19 U/L (ref 15–37)
SGPT (ALT): 19 U/L
Sodium: 140 mmol/L (ref 136–145)
Total Protein: 7.7 g/dL (ref 6.4–8.2)

## 2011-08-02 LAB — LIPASE, BLOOD: Lipase: 131 U/L (ref 73–393)

## 2011-08-02 LAB — CBC
HCT: 35.9 % (ref 35.0–47.0)
HGB: 11.5 g/dL — ABNORMAL LOW (ref 12.0–16.0)
MCH: 25 pg — ABNORMAL LOW (ref 26.0–34.0)
MCHC: 32 g/dL (ref 32.0–36.0)
MCV: 78 fL — ABNORMAL LOW (ref 80–100)
Platelet: 242 10*3/uL (ref 150–440)
RBC: 4.6 10*6/uL (ref 3.80–5.20)
RDW: 15.9 % — ABNORMAL HIGH (ref 11.5–14.5)
WBC: 7.2 10*3/uL (ref 3.6–11.0)

## 2011-08-03 ENCOUNTER — Encounter (INDEPENDENT_AMBULATORY_CARE_PROVIDER_SITE_OTHER): Payer: Self-pay | Admitting: Surgery

## 2011-08-03 ENCOUNTER — Encounter (INDEPENDENT_AMBULATORY_CARE_PROVIDER_SITE_OTHER): Payer: Self-pay

## 2011-08-04 ENCOUNTER — Ambulatory Visit (INDEPENDENT_AMBULATORY_CARE_PROVIDER_SITE_OTHER): Payer: BC Managed Care – PPO | Admitting: Surgery

## 2011-08-04 ENCOUNTER — Encounter (INDEPENDENT_AMBULATORY_CARE_PROVIDER_SITE_OTHER): Payer: Self-pay | Admitting: Surgery

## 2011-08-04 VITALS — BP 122/74 | HR 68 | Temp 97.4°F | Resp 18 | Ht 67.0 in | Wt 329.2 lb

## 2011-08-04 DIAGNOSIS — R112 Nausea with vomiting, unspecified: Secondary | ICD-10-CM | POA: Insufficient documentation

## 2011-08-04 DIAGNOSIS — Z9884 Bariatric surgery status: Secondary | ICD-10-CM | POA: Insufficient documentation

## 2011-08-04 NOTE — Progress Notes (Addendum)
[facility]  Ovidio Kin, MD,  FACS [address]  [address] [place], [place]    [address] Phone:  [phone] FAX:  [phone]   Re:   ZYON GROUT DOB:   02-16-1961 MRN:   [id number]  ASSESSMENT AND PLAN: 1. Lap band, APS - 03/20/2007  Initial weight - 337, BMI - 52.9.  Though I have not seen her in over 3 years, she comes today for the express purpose of have her lap band removed.  .She is frustrated with the lap band and the chronic nausea/vomiting.  We talked about trying to manage her lap band, but she is not interested.  In taking her history, I do not think that imaging her abdomen will change anything.  She has a CT scan about one year ago that shows the band in good position.  I discussed the risks of surgery and removing the lap band.  The risks included bleeding, infection, perforation of bowel, open surgery, and the chance that the surgery may not improve her nausea.  I discussed with her that there could be other causes for her nausea.  She seems to understand this.  I told her I could also look in her LUQ for a cause for her abdominal pain.  2. Morbid obesity - Today's weight - 329, BMI - 51.6  3. Hyperlipidemia.  4. Hypertension.   On lisonopril, though she says that she in not hypertensive. 5. Sleep apnea but cannot tolerate CPAP.   Claustrophobia with mask 6. Gastroesophageal reflux disease.  7. History of bladder pacemaker placed by Dr. Lorin Picket McDiarmid.  8. Dermatoid disease.  9.  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 10. Hypothyroid.  11. History of depression.   Sees [facility] in [place] from a psych standpoint.  Her husband left her last summer and this has devastated her. 12. History of restless leg syndrome. 13.  Rheumatoid arthritis.  On methotrexate.  Has been on Percocet, though she says she has not had any in 2 weeks.  Sees Dr. Zenovia Jordan. 14.  Diastasis recti.  She thought this was a hernia. 15.  She has  been on chronic percocet, though she says she is off of this over the past two weeks.   HISTORY OF PRESENT ILLNESS: Chief Complaint  Patient presents with  . Lap Band Fill    Discuss possible removal    [name] [name] is a 51 y.o. (DOB: [birth year])  white female who is a patient of Kari Baars, MD, MD and comes to me today for follow up Lap Band.  I have not seen the patient since 01/16/2008.  She says that she has not seen me since 2009 because I was "hateful"  and I made her cry.  I re-read my note and at that time I was worried about her effort with the lap band regarding how well she was following the diet and how well she was trying to include exercise in her life.  But in the last 3 years, [name] [name] has had almost daily nausea and vomiting.  She says that she cannot keep anything down.  She says that she "throws up all the time."  She can eat mashed potatoes.  But despite the complaint of chronic nausea and vomiting, she has maintained her weight.  She also complains of LUQ pain that migrates.  She is convinced the lap band may be causing her pain.  But I reviewed with her that she can  probably not feel the band.  She said the abdominal pain was so bad 2 days ago that she went [facility]. It does not sound like they did much.  (We will try to get those records.)  I have no records from that visit.  She did have a history of a "strangulated hernia" in abdominal wall, though she could not remember when this happened.  She had a lap chole in [place] in 2000.  She had a vaginal hysterectomy 2003 for benign disease. She had a bladder pacemaker by Dr. Kathie Rhodes. McDiarmid in 2005.  She has had no fever, no change in bowel habits, no surgery since I last saw her.  She has been traumatized emotionally in that her husband left her last summer (2012).  She has a daughter who is 3 who is on disability.  It sounds like [name] [name] are co-dependent on each other. [name] [name] is also applying for disability.  She  stopped  Her main new issues are RA and neuropathy of the LE.  She had a CT scan on 05/31/2010 showed the lap band in reasonable position.  It looks like she got the CT scan to r/o PE.  At first when I talked to her, she did not remember doing this test.  Past Medical History  Diagnosis Date  . Anxiety   . Morbid obesity   . Depression   . Panic attacks   . Diabetes mellitus   . GERD (gastroesophageal reflux disease)   . Fluid retention   . IBS (irritable bowel syndrome)   . Sleep apnea   . Esophagitis   . Hiatal hernia   . Neurogenic bladder     has pacemaker  . Arthritis     Rheumatoid  . Neuropathy     Current Outpatient Prescriptions  Medication Sig Dispense Refill  . clotrimazole-betamethasone (LOTRISONE) cream as needed.      Marland Kitchen FLUoxetine (PROZAC) 20 MG capsule Take 20 mg by mouth 4 (four) times daily.      . folic acid (FOLVITE) 1 MG tablet Twice daily.      Marland Kitchen gabapentin (NEURONTIN) 300 MG capsule Three times a day.      . hydrOXYzine (ATARAX/VISTARIL) 50 MG tablet Twice daily.      . methotrexate 1 G injection Inject into the vein once a week.      . nitrofurantoin, macrocrystal-monohydrate, (MACROBID) 100 MG capsule Twice daily.      Marland Kitchen oxyCODONE-acetaminophen (PERCOCET) 5-325 MG per tablet Take 1 tablet by mouth as needed.      . promethazine (PHENERGAN) 25 MG tablet Take 25 mg by mouth as needed.      . traZODone (DESYREL) 100 MG tablet Take 100 mg by mouth 2 (two) times daily.      Marland Kitchen levothyroxine (SYNTHROID, LEVOTHROID) 50 MCG tablet Take 50 mcg by mouth daily.      Marland Kitchen lisinopril-hydrochlorothiazide (PRINZIDE,ZESTORETIC) 20-25 MG per tablet Take 1 tablet by mouth daily.       Allergies  Allergen Reactions  . Codeine Palpitations    "makes heart fly, she gets flushed and passes out"  . Sulfa Antibiotics Palpitations    "makes heart fly, she gets flushed and passes out"    PHYSICAL EXAM: BP 122/74  Pulse 68  Temp(Src) 97.4 F (36.3 C) (Temporal)  Resp 18   Ht 5\' 7"  (1.702 m)  Wt 329 lb 4 oz (149.347 kg)  BMI 51.57 kg/m2  LMP 04/20/2001  General: Morbidly obese WF who is  alert.  HEENT: Normal. Pupils equal. Good dentition. Neck: Supple. No mass.  No thyroid mass.  Lymph Nodes:  No supraclavicular or cervical nodes. Lungs: Clear to auscultation and symmetric breath sounds. Heart:  RRR. No murmur or rub.  Abdomen: Soft. No mass. No tenderness. No hernia. She still has a very large abdomen which limits the physical exam. Lap band in RUQ.  She has a diastasis recti.  She complains of a migrating LUQ pain, but I can find nothing on physical exam.  She does have a diastasis recti. Rectal: Not done. Extremities:  Good strength and ROM  in upper and lower extremities. Neurologic:  Complains of numbness in feet.  Slow to get on exam table. Psychiatric: Behavior is normal.   PROCEDURE:  While in the office, I accessed her band.  She had 1.2 cc in the band and I removed all the fluid.  DATA REVIEWED: CT scan from 05/2010. Addendum:  Records from [facility] - 08/02/2011.  Lipase - 131, Hgb - 11.5, Albumin - 3.3, UA showed 61 WBC/hpf and 3+ bacteria - their impression was that she had a UTI.  Ovidio Kin, MD, FACS Office:  [phone]

## 2011-09-09 ENCOUNTER — Encounter (INDEPENDENT_AMBULATORY_CARE_PROVIDER_SITE_OTHER): Payer: Self-pay | Admitting: Surgery

## 2011-09-09 ENCOUNTER — Ambulatory Visit (INDEPENDENT_AMBULATORY_CARE_PROVIDER_SITE_OTHER): Payer: BC Managed Care – PPO | Admitting: Surgery

## 2011-09-09 VITALS — BP 120/82 | HR 66 | Temp 97.2°F | Resp 18 | Ht 67.0 in | Wt 321.0 lb

## 2011-09-09 DIAGNOSIS — Z9884 Bariatric surgery status: Secondary | ICD-10-CM

## 2011-09-09 NOTE — Progress Notes (Signed)
[facility]  Pastor Sgro, MD,  FACS [address]  [address] [place], [place]    [address] Phone:  [phone] FAX:  [phone]   Re:   [name] [name] DOB:   08/29/1960 MRN:   [id number]  ASSESSMENT AND PLAN: 1. Lap band, APS - 03/20/2007  Initial weight - 337, BMI - 52.9.  I saw her 08/04/2011 and had a long discussion about removing the lap band.  The note (much copied to this note) outlines her history and wishes.  I discussed the risks of surgery and removing the lap band.  The risks included bleeding, infection, perforation of bowel, open surgery, and the chance that the surgery may not improve her nausea.  I discussed with her that there could be other causes for her nausea.  She seems to understand this.  I told her I could also look in her LUQ for a cause for her abdominal pain.  She seems to have less LUQ complaints today.  I withdrew all the fluid from her lap band last visit.  She is on for surgery to remove the lap band on 09/21/2011.  2. Morbid obesity - Today's weight - 321, BMI - 50.4 (actually down 8 pounds from when I last saw her.)  3. Hyperlipidemia.  4. Hypertension.   On lisonopril, though she says that she in not hypertensive. 5. Sleep apnea but cannot tolerate CPAP.   Claustrophobia with mask 6. Gastroesophageal reflux disease.  7. History of bladder pacemaker placed by Dr. Scott McDiarmid.  8. Dermatoid disease.  9.  Neuropathy involving LE.    Has been on nuerontin. 10. Hypothyroid.  11. History of depression.   Sees [facility] in [place] from a psych standpoint.  Her husband left her last summer and this has devastated her. 12. History of restless leg syndrome. 13.  Rheumatoid arthritis.  It is acting up more today.  On methotrexate.  Has been on Percocet, though she says she has not had any in 2 weeks.  Sees Dr. Angela Hawkes. 14.  Diastasis recti.  She thought this was a hernia. 15.  She has been on chronic percocet. She  has tried stay away from this the last couple of months. 16.  Her allergies are acting up.  She said the air conditioner at her house is broken. 17.  She showed me a rash across her lower abdomen under her pannus.  This looks like a superficial fungal rash.  I suggested she try some over the counter anti fungal cream/powder.   HISTORY OF PRESENT ILLNESS: Chief Complaint  Patient presents with  . Bariatric Pre-op    lap band removal    [name] [name] is a 51 y.o. (DOB: 04/19/1960)  white female who is a patient of SHAW,W DOUGLAS, MD, MD and comes to me today for follow up Lap Band.  She wants her lap band removed and she is scheduled 09/21/2011.  [Much of this note is from her last visit.] I have not seen the patient since 01/16/2008.  She says that she has not seen me since 2009 because I was "hateful"  and I made her cry.  I re-read my note and at that time I was worried about her effort with the lap band regarding how well she was following the diet and how well she was trying to include exercise in her life.  But in the last 3 years, Ms. [name] has had almost daily nausea and vomiting.  She   says that she cannot keep anything down.  She says that she "throws up all the time."  She can eat mashed potatoes.  But despite the complaint of chronic nausea and vomiting, she has maintained her weight.  She also complains of LUQ pain that migrates.  She is convinced the lap band may be causing her pain.  But I reviewed with her that she can probably not feel the band.  She said the abdominal pain was so bad in April, 2013, (2 days before last visit) that she went [facility] ER. It does not sound like they did much.  (I have been unable to get those records.)  I have no records from that visit.  She did have a history of a "strangulated hernia" in abdominal wall, though she could not remember when this happened.  She had a lap chole in [facility] in 2000.  She had a vaginal hysterectomy 2003 for benign disease.  She had a bladder pacemaker by Dr. S. McDiarmid in 2005.  She has had no fever, no change in bowel habits, no surgery since I last saw her.  She has been traumatized emotionally in that her husband left her last summer (2012).  She has a daughter who is 33 who is on disability.  It sounds like Ms.[name] and her daughter are co-dependent on each other. Ms. [name] is also applying for disability.  Her main new issues are RA and neuropathy of the LE.  She had a CT scan on 05/31/2010 showed the lap band in reasonable position.  It looks like she got the CT scan to r/o PE.  At first when I talked to her, she did not remember doing this test.  Past Medical History  Diagnosis Date  . Anxiety   . Morbid obesity   . Depression   . Panic attacks   . Diabetes mellitus   . GERD (gastroesophageal reflux disease)   . Fluid retention   . IBS (irritable bowel syndrome)   . Sleep apnea   . Esophagitis   . Hiatal hernia   . Neurogenic bladder     has pacemaker  . Arthritis     Rheumatoid  . Neuropathy     Current Outpatient Prescriptions  Medication Sig Dispense Refill  . clotrimazole-betamethasone (LOTRISONE) cream as needed.      . FLUoxetine (PROZAC) 20 MG capsule Take 20 mg by mouth 4 (four) times daily.      . folic acid (FOLVITE) 1 MG tablet Twice daily.      . gabapentin (NEURONTIN) 300 MG capsule Three times a day.      . hydrOXYzine (ATARAX/VISTARIL) 50 MG tablet Twice daily.      . levothyroxine (SYNTHROID, LEVOTHROID) 50 MCG tablet Take 50 mcg by mouth daily.      . lisinopril-hydrochlorothiazide (PRINZIDE,ZESTORETIC) 20-25 MG per tablet Take 1 tablet by mouth daily.      . methotrexate 1 G injection Inject into the vein once a week.      . nitrofurantoin, macrocrystal-monohydrate, (MACROBID) 100 MG capsule Twice daily.      . oxyCODONE-acetaminophen (PERCOCET) 5-325 MG per tablet Take 1 tablet by mouth as needed.      . promethazine (PHENERGAN) 25 MG tablet Take 25 mg by mouth as needed.       . traZODone (DESYREL) 100 MG tablet Take 100 mg by mouth 2 (two) times daily.       Allergies  Allergen Reactions  . Codeine Palpitations    "  makes heart fly, she gets flushed and passes out"  . Sulfa Antibiotics Palpitations    "makes heart fly, she gets flushed and passes out"    PHYSICAL EXAM: BP 120/82  Pulse 66  Temp(Src) 97.2 F (36.2 C) (Temporal)  Resp 18  Ht 5' 7" (1.702 m)  Wt 321 lb (145.605 kg)  BMI 50.28 kg/m2  LMP 04/20/2001  General: Morbidly obese WF who is alert.  HEENT: Normal. Pupils equal. Good dentition. Neck: Supple. No mass.  No thyroid mass.  Lymph Nodes:  No supraclavicular or cervical nodes. Lungs: Clear to auscultation and symmetric breath sounds. Heart:  RRR. No murmur or rub.  Abdomen: Soft. No mass. No tenderness. No hernia. She still has a very large abdomen which limits the physical exam. Lap band in RUQ.  She does have a diastasis recti. She has a fungal infection under her pannus.  But there is no cellulitis. Rectal: Not done. Extremities:  She is walking slowly today.  She says her RA is acting up. Neurologic:  Complains of numbness in feet.  Slow to get on exam table. Psychiatric: Behavior is normal.   DATA REVIEWED: CT scan from 05/2010. Addendum:  Records from [facility] - 08/02/2011.  Lipase - 131, Hgb - 11.5, Albumin - 3.3, UA showed 61 WBC/hpf and 3+ bacteria - their impression was that she had a UTI.  Artemis Loyal, MD, FACS Office:  [phone]  

## 2011-09-10 ENCOUNTER — Encounter (HOSPITAL_COMMUNITY): Payer: Self-pay | Admitting: Pharmacy Technician

## 2011-09-14 ENCOUNTER — Other Ambulatory Visit (INDEPENDENT_AMBULATORY_CARE_PROVIDER_SITE_OTHER): Payer: Self-pay | Admitting: Surgery

## 2011-09-16 ENCOUNTER — Inpatient Hospital Stay (HOSPITAL_COMMUNITY): Admission: RE | Admit: 2011-09-16 | Payer: BC Managed Care – PPO | Source: Ambulatory Visit

## 2011-09-16 ENCOUNTER — Ambulatory Visit (INDEPENDENT_AMBULATORY_CARE_PROVIDER_SITE_OTHER): Payer: BC Managed Care – PPO | Admitting: General Surgery

## 2011-09-17 ENCOUNTER — Ambulatory Visit (HOSPITAL_COMMUNITY)
Admission: RE | Admit: 2011-09-17 | Discharge: 2011-09-17 | Disposition: A | Discharge status: Home / Self Care | Payer: BC Managed Care – PPO | Source: Ambulatory Visit | Attending: Surgery | Admitting: Surgery

## 2011-09-17 ENCOUNTER — Encounter (HOSPITAL_COMMUNITY): Payer: Self-pay

## 2011-09-17 ENCOUNTER — Encounter (HOSPITAL_COMMUNITY)
Admission: RE | Admit: 2011-09-17 | Discharge: 2011-09-17 | Disposition: A | Discharge status: Home / Self Care | Payer: BC Managed Care – PPO | Source: Ambulatory Visit | Attending: Surgery | Admitting: Surgery

## 2011-09-17 DIAGNOSIS — Z01818 Encounter for other preprocedural examination: Secondary | ICD-10-CM | POA: Insufficient documentation

## 2011-09-17 DIAGNOSIS — I498 Other specified cardiac arrhythmias: Secondary | ICD-10-CM | POA: Insufficient documentation

## 2011-09-17 DIAGNOSIS — Z9884 Bariatric surgery status: Secondary | ICD-10-CM | POA: Insufficient documentation

## 2011-09-17 HISTORY — DX: Hypothyroidism, unspecified: E03.9

## 2011-09-17 LAB — BASIC METABOLIC PANEL
BUN: 8 mg/dL (ref 6–23)
CO2: 28 mEq/L (ref 19–32)
Calcium: 9.2 mg/dL (ref 8.4–10.5)
Chloride: 104 mEq/L (ref 96–112)
Creatinine, Ser: 0.66 mg/dL (ref 0.50–1.10)
GFR calc Af Amer: >90 mL/min (ref >90)
GFR calc non Af Amer: >90 mL/min (ref >90)
Glucose, Bld: 92 mg/dL (ref 70–99)
Potassium: 3.7 mEq/L (ref 3.5–5.1)
Sodium: 140 mEq/L (ref 135–145)

## 2011-09-17 LAB — CBC
HCT: 37.9 % (ref 36.0–46.0)
Hemoglobin: 12.1 g/dL (ref 12.0–15.0)
MCH: 24.6 pg — ABNORMAL LOW (ref 26.0–34.0)
MCHC: 31.9 g/dL (ref 30.0–36.0)
MCV: 77.2 fL — ABNORMAL LOW (ref 78.0–100.0)
Platelets: 259 10*3/uL (ref 150–400)
RBC: 4.91 MIL/uL (ref 3.87–5.11)
RDW: 15 % (ref 11.5–15.5)
WBC: 6.2 10*3/uL (ref 4.0–10.5)

## 2011-09-17 LAB — SURGICAL PCR SCREEN
MRSA, PCR: NEGATIVE
Staphylococcus aureus: NEGATIVE

## 2011-09-17 NOTE — Pre-Procedure Instructions (Signed)
STATES NEEDS NAUSEA MEDS WITH PAIN MEDS EVERY TIME SHE RECIEVES PAIN MEDS or will vomit

## 2011-09-17 NOTE — Patient Instructions (Signed)
21 [name] [name]  09/17/2011   Your procedure is scheduled on:  09/21/11  Surgery [id number]  Report to [facility] at 0725      AM.  Call this number if you have problems the morning of surgery: [phone]     Or PST   [id number]  [facility]             Stop antiinflammatories, herbals, or vitamins 5-7 days pre op  Remember:   Do not eat food or drink fluids :After Midnight. Monday NIGHT      Take these medicines the morning of surgery with A SIP OF WATER: Buspar, Prozac, Neurontin, Levothyroxine, Hydroxyzine                                            MAY TAKE PERCOCET OR PHENERGRAN IF NEEDED         Do not wear jewelry, make-up or nail polish.  Do not wear lotions, powders, or perfumes. You may wear deodorant.  Do not shave 48 hours prior to surgery.  Do not bring valuables to the hospital.  Contacts, dentures or bridgework may not be worn into surgery.  Leave suitcase in the car. After surgery it may be brought to your room.  For patients admitted to the hospital, checkout time is 11:00 AM the day of discharge.   Patients discharged the day of surgery will not be allowed to drive home.  Name and phone number of your driver:  mother                                                                    Special Instructions: CHG Shower Use Special Wash: 1/2 bottle night before surgery and 1/2 bottle morning of surgery. REGULAR SOAP FACE AND PRIVATES              LADIES- NO SHAVING 48 HOURS BEFORE USING BETASEPT SOAP.              Please read over the following fact sheets that you were given: MRSA Information

## 2011-09-21 ENCOUNTER — Ambulatory Visit (HOSPITAL_COMMUNITY): Payer: BC Managed Care – PPO | Admitting: Anesthesiology

## 2011-09-21 ENCOUNTER — Encounter (HOSPITAL_COMMUNITY): Payer: Self-pay

## 2011-09-21 ENCOUNTER — Encounter (HOSPITAL_COMMUNITY): Payer: Self-pay | Admitting: Anesthesiology

## 2011-09-21 ENCOUNTER — Encounter (HOSPITAL_COMMUNITY)
Admission: RE | Disposition: A | Discharge status: Home / Self Care | Payer: Self-pay | Source: Ambulatory Visit | Attending: Surgery

## 2011-09-21 ENCOUNTER — Ambulatory Visit (HOSPITAL_COMMUNITY)
Admission: RE | Admit: 2011-09-21 | Discharge: 2011-09-23 | Disposition: A | Discharge status: Home / Self Care | Payer: BC Managed Care – PPO | Source: Ambulatory Visit | Attending: Surgery | Admitting: Surgery

## 2011-09-21 DIAGNOSIS — R112 Nausea with vomiting, unspecified: Secondary | ICD-10-CM | POA: Insufficient documentation

## 2011-09-21 DIAGNOSIS — I10 Essential (primary) hypertension: Secondary | ICD-10-CM | POA: Insufficient documentation

## 2011-09-21 DIAGNOSIS — F3289 Other specified depressive episodes: Secondary | ICD-10-CM | POA: Insufficient documentation

## 2011-09-21 DIAGNOSIS — M069 Rheumatoid arthritis, unspecified: Secondary | ICD-10-CM | POA: Insufficient documentation

## 2011-09-21 DIAGNOSIS — Z79899 Other long term (current) drug therapy: Secondary | ICD-10-CM | POA: Insufficient documentation

## 2011-09-21 DIAGNOSIS — Z6841 Body Mass Index (BMI) 40.0 and over, adult: Secondary | ICD-10-CM

## 2011-09-21 DIAGNOSIS — Y831 Surgical operation with implant of artificial internal device as the cause of abnormal reaction of the patient, or of later complication, without mention of misadventure at the time of the procedure: Secondary | ICD-10-CM | POA: Insufficient documentation

## 2011-09-21 DIAGNOSIS — G5790 Unspecified mononeuropathy of unspecified lower limb: Secondary | ICD-10-CM | POA: Insufficient documentation

## 2011-09-21 DIAGNOSIS — E039 Hypothyroidism, unspecified: Secondary | ICD-10-CM | POA: Insufficient documentation

## 2011-09-21 DIAGNOSIS — G2581 Restless legs syndrome: Secondary | ICD-10-CM | POA: Insufficient documentation

## 2011-09-21 DIAGNOSIS — R110 Nausea: Secondary | ICD-10-CM

## 2011-09-21 DIAGNOSIS — E785 Hyperlipidemia, unspecified: Secondary | ICD-10-CM | POA: Insufficient documentation

## 2011-09-21 DIAGNOSIS — M6200 Separation of muscle (nontraumatic), unspecified site: Secondary | ICD-10-CM | POA: Insufficient documentation

## 2011-09-21 DIAGNOSIS — Z9884 Bariatric surgery status: Secondary | ICD-10-CM

## 2011-09-21 DIAGNOSIS — K9509 Other complications of gastric band procedure: Secondary | ICD-10-CM | POA: Insufficient documentation

## 2011-09-21 DIAGNOSIS — G4730 Sleep apnea, unspecified: Secondary | ICD-10-CM | POA: Insufficient documentation

## 2011-09-21 DIAGNOSIS — F329 Major depressive disorder, single episode, unspecified: Secondary | ICD-10-CM | POA: Insufficient documentation

## 2011-09-21 DIAGNOSIS — K219 Gastro-esophageal reflux disease without esophagitis: Secondary | ICD-10-CM | POA: Insufficient documentation

## 2011-09-21 LAB — CREATININE, SERUM
Creatinine, Ser: 0.72 mg/dL (ref 0.50–1.10)
GFR calc Af Amer: >90 mL/min (ref >90)
GFR calc non Af Amer: >90 mL/min (ref >90)

## 2011-09-21 LAB — GLUCOSE, CAPILLARY
Glucose-Capillary: 104 mg/dL — ABNORMAL HIGH (ref 70–99)
Glucose-Capillary: 105 mg/dL — ABNORMAL HIGH (ref 70–99)

## 2011-09-21 SURGERY — LAPAROSCOPIC REPAIR AND REMOVAL OF GASTRIC BAND
Anesthesia: General | Site: Abdomen | Wound class: Clean

## 2011-09-21 MED ORDER — SODIUM CHLORIDE 0.9 % IR SOLN
Status: DC | PRN
  Administered 2011-09-21: 1000 mL

## 2011-09-21 MED ORDER — ROCURONIUM BROMIDE 100 MG/10ML IV SOLN
INTRAVENOUS | Status: DC | PRN
  Administered 2011-09-21: 50 mg via INTRAVENOUS
  Administered 2011-09-21: 20 mg via INTRAVENOUS

## 2011-09-21 MED ORDER — LACTATED RINGERS IV SOLN
INTRAVENOUS | Status: DC | PRN
  Administered 2011-09-21: 10:00:00 via INTRAVENOUS

## 2011-09-21 MED ORDER — LIDOCAINE HCL (CARDIAC) 20 MG/ML IV SOLN
INTRAVENOUS | Status: DC | PRN
  Administered 2011-09-21: 100 mg via INTRAVENOUS

## 2011-09-21 MED ORDER — TRAZODONE HCL 100 MG PO TABS
100.0000 mg | ORAL_TABLET | Freq: Every day | ORAL | Status: DC
  Administered 2011-09-21 – 2011-09-22 (×2): 100 mg via ORAL
  Filled 2011-09-21 (×3): qty 1

## 2011-09-21 MED ORDER — PROMETHAZINE HCL 25 MG/ML IJ SOLN
6.2500 mg | INTRAMUSCULAR | Status: DC | PRN
  Administered 2011-09-21: 6.25 mg via INTRAVENOUS

## 2011-09-21 MED ORDER — LEVOTHYROXINE SODIUM 50 MCG PO TABS
50.0000 ug | ORAL_TABLET | Freq: Every day | ORAL | Status: DC
  Administered 2011-09-22 – 2011-09-23 (×2): 50 ug via ORAL
  Filled 2011-09-21 (×3): qty 1

## 2011-09-21 MED ORDER — MIDAZOLAM HCL 5 MG/5ML IJ SOLN
INTRAMUSCULAR | Status: DC | PRN
  Administered 2011-09-21: 2 mg via INTRAVENOUS

## 2011-09-21 MED ORDER — HEPARIN SODIUM (PORCINE) 5000 UNIT/ML IJ SOLN
5000.0000 [IU] | Freq: Three times a day (TID) | INTRAMUSCULAR | Status: DC
  Administered 2011-09-21 – 2011-09-23 (×5): 5000 [IU] via SUBCUTANEOUS
  Filled 2011-09-21 (×8): qty 1

## 2011-09-21 MED ORDER — ONDANSETRON HCL 4 MG/2ML IJ SOLN
4.0000 mg | INTRAMUSCULAR | Status: DC | PRN
  Administered 2011-09-22 (×3): 4 mg via INTRAVENOUS
  Filled 2011-09-21 (×3): qty 2

## 2011-09-21 MED ORDER — MORPHINE SULFATE 2 MG/ML IJ SOLN
2.0000 mg | INTRAMUSCULAR | Status: DC | PRN
  Administered 2011-09-21 (×2): 2 mg via INTRAVENOUS
  Administered 2011-09-21: 4 mg via INTRAVENOUS
  Administered 2011-09-22 (×2): 2 mg via INTRAVENOUS
  Filled 2011-09-21 (×3): qty 2
  Filled 2011-09-21 (×2): qty 1

## 2011-09-21 MED ORDER — OXYCODONE-ACETAMINOPHEN 5-325 MG/5ML PO SOLN: 5.0000 mL | ORAL | Status: DC | PRN

## 2011-09-21 MED ORDER — HYDROMORPHONE HCL PF 1 MG/ML IJ SOLN
INTRAMUSCULAR | Status: AC
  Filled 2011-09-21: qty 1

## 2011-09-21 MED ORDER — FLUOXETINE HCL 20 MG PO CAPS
80.0000 mg | ORAL_CAPSULE | Freq: Every day | ORAL | Status: DC
  Administered 2011-09-21 – 2011-09-23 (×3): 80 mg via ORAL
  Filled 2011-09-21 (×3): qty 4

## 2011-09-21 MED ORDER — HYDROCHLOROTHIAZIDE 25 MG PO TABS
25.0000 mg | ORAL_TABLET | Freq: Every day | ORAL | Status: DC
  Administered 2011-09-21 – 2011-09-23 (×2): 25 mg via ORAL
  Filled 2011-09-21 (×3): qty 1

## 2011-09-21 MED ORDER — ONDANSETRON HCL 4 MG/2ML IJ SOLN
INTRAMUSCULAR | Status: DC | PRN
  Administered 2011-09-21 (×2): 2 mg via INTRAVENOUS

## 2011-09-21 MED ORDER — LACTATED RINGERS IV SOLN
INTRAVENOUS | Status: DC
  Administered 2011-09-21: 13:00:00 via INTRAVENOUS

## 2011-09-21 MED ORDER — LISINOPRIL 20 MG PO TABS
20.0000 mg | ORAL_TABLET | Freq: Every day | ORAL | Status: DC
  Administered 2011-09-21 – 2011-09-23 (×2): 20 mg via ORAL
  Filled 2011-09-21 (×3): qty 1

## 2011-09-21 MED ORDER — HEPARIN SODIUM (PORCINE) 5000 UNIT/ML IJ SOLN
INTRAMUSCULAR | Status: AC
  Administered 2011-09-21: 5000 [IU] via SUBCUTANEOUS
  Filled 2011-09-21: qty 1

## 2011-09-21 MED ORDER — GABAPENTIN 300 MG PO CAPS
300.0000 mg | ORAL_CAPSULE | Freq: Every day | ORAL | Status: DC
  Administered 2011-09-21 – 2011-09-22 (×2): 300 mg via ORAL
  Filled 2011-09-21 (×3): qty 1

## 2011-09-21 MED ORDER — LACTATED RINGERS IV SOLN
INTRAVENOUS | Status: DC | PRN
  Administered 2011-09-21 (×2): via INTRAVENOUS

## 2011-09-21 MED ORDER — PROMETHAZINE HCL 25 MG PO TABS
25.0000 mg | ORAL_TABLET | Freq: Four times a day (QID) | ORAL | Status: DC | PRN
  Administered 2011-09-22: 25 mg via ORAL
  Filled 2011-09-21: qty 1

## 2011-09-21 MED ORDER — LISINOPRIL-HYDROCHLOROTHIAZIDE 20-25 MG PO TABS: 1.0000 | ORAL_TABLET | Freq: Every day | ORAL | Status: DC

## 2011-09-21 MED ORDER — PROMETHAZINE HCL 25 MG/ML IJ SOLN
INTRAMUSCULAR | Status: AC
  Filled 2011-09-21: qty 1

## 2011-09-21 MED ORDER — CEFAZOLIN SODIUM-DEXTROSE 2-3 GM-% IV SOLR
2.0000 g | INTRAVENOUS | Status: AC
  Administered 2011-09-21: 2 g via INTRAVENOUS

## 2011-09-21 MED ORDER — HYDROXYZINE HCL 50 MG PO TABS
50.0000 mg | ORAL_TABLET | Freq: Two times a day (BID) | ORAL | Status: DC | PRN
  Filled 2011-09-21: qty 1

## 2011-09-21 MED ORDER — EPHEDRINE SULFATE 50 MG/ML IJ SOLN
INTRAMUSCULAR | Status: DC | PRN
  Administered 2011-09-21: 5 mg via INTRAVENOUS

## 2011-09-21 MED ORDER — FENTANYL CITRATE 0.05 MG/ML IJ SOLN
INTRAMUSCULAR | Status: DC | PRN
  Administered 2011-09-21: 100 ug via INTRAVENOUS
  Administered 2011-09-21: 50 ug via INTRAVENOUS
  Administered 2011-09-21 (×2): 100 ug via INTRAVENOUS

## 2011-09-21 MED ORDER — BUPIVACAINE HCL (PF) 0.5 % IJ SOLN
INTRAMUSCULAR | Status: AC
  Filled 2011-09-21: qty 30

## 2011-09-21 MED ORDER — DIPHENHYDRAMINE HCL 50 MG/ML IJ SOLN
INTRAMUSCULAR | Status: AC
  Filled 2011-09-21: qty 1

## 2011-09-21 MED ORDER — NEOSTIGMINE METHYLSULFATE 1 MG/ML IJ SOLN
INTRAMUSCULAR | Status: DC | PRN
  Administered 2011-09-21: 3 mg via INTRAVENOUS

## 2011-09-21 MED ORDER — ATROPINE SULFATE 0.4 MG/ML IJ SOLN
INTRAMUSCULAR | Status: DC | PRN
  Administered 2011-09-21: 0.4 mg via INTRAVENOUS

## 2011-09-21 MED ORDER — BUSPIRONE HCL 10 MG PO TABS
10.0000 mg | ORAL_TABLET | Freq: Two times a day (BID) | ORAL | Status: DC
  Administered 2011-09-21 – 2011-09-23 (×4): 10 mg via ORAL
  Filled 2011-09-21 (×5): qty 1

## 2011-09-21 MED ORDER — HYDROMORPHONE HCL PF 1 MG/ML IJ SOLN
0.2500 mg | INTRAMUSCULAR | Status: DC | PRN
  Administered 2011-09-21 (×4): 0.5 mg via INTRAVENOUS

## 2011-09-21 MED ORDER — KCL IN DEXTROSE-NACL 20-5-0.45 MEQ/L-%-% IV SOLN
INTRAVENOUS | Status: DC
  Administered 2011-09-21 – 2011-09-23 (×4): via INTRAVENOUS
  Filled 2011-09-21 (×7): qty 1000

## 2011-09-21 MED ORDER — ACETAMINOPHEN 160 MG/5ML PO SOLN
650.0000 mg | ORAL | Status: DC | PRN
  Administered 2011-09-21: 650 mg via ORAL
  Filled 2011-09-21: qty 20.3

## 2011-09-21 MED ORDER — DIPHENHYDRAMINE HCL 50 MG/ML IJ SOLN
25.0000 mg | Freq: Three times a day (TID) | INTRAMUSCULAR | Status: DC | PRN
  Administered 2011-09-21: 25 mg via INTRAVENOUS

## 2011-09-21 MED ORDER — CEFAZOLIN SODIUM-DEXTROSE 2-3 GM-% IV SOLR
INTRAVENOUS | Status: AC
  Filled 2011-09-21: qty 50

## 2011-09-21 MED ORDER — HEPARIN SODIUM (PORCINE) 5000 UNIT/ML IJ SOLN
5000.0000 [IU] | Freq: Once | INTRAMUSCULAR | Status: AC
  Administered 2011-09-21: 5000 [IU] via SUBCUTANEOUS

## 2011-09-21 MED ORDER — GLYCOPYRROLATE 0.2 MG/ML IJ SOLN
INTRAMUSCULAR | Status: DC | PRN
  Administered 2011-09-21: 0.6 mg via INTRAVENOUS

## 2011-09-21 MED ORDER — BUPIVACAINE HCL 0.5 % IJ SOLN
INTRAMUSCULAR | Status: DC | PRN
  Administered 2011-09-21 (×2): 15 mL

## 2011-09-21 MED ORDER — PROPOFOL 10 MG/ML IV BOLUS
INTRAVENOUS | Status: DC | PRN
  Administered 2011-09-21: 200 mg via INTRAVENOUS

## 2011-09-21 SURGICAL SUPPLY — 58 items
ADH SKN CLS APL DERMABOND .7 (GAUZE/BANDAGES/DRESSINGS) ×1
BLADE HEX COATED 2.75 (ELECTRODE) ×2 IMPLANT
BLADE SURG 15 STRL LF DISP TIS (BLADE) IMPLANT
BLADE SURG 15 STRL SS (BLADE) ×2
BLADE SURG SZ11 CARB STEEL (BLADE) ×2 IMPLANT
CABLE HIGH FREQUENCY MONO STRZ (ELECTRODE) ×2 IMPLANT
CANISTER SUCTION 2500CC (MISCELLANEOUS) ×2 IMPLANT
CLOTH BEACON ORANGE TIMEOUT ST (SAFETY) ×2 IMPLANT
DECANTER SPIKE VIAL GLASS SM (MISCELLANEOUS) ×4 IMPLANT
DERMABOND ADVANCED (GAUZE/BANDAGES/DRESSINGS) ×1
DERMABOND ADVANCED .7 DNX12 (GAUZE/BANDAGES/DRESSINGS) ×1 IMPLANT
DEVICE SUT QUICK LOAD TK 5 (STAPLE) ×3 IMPLANT
DEVICE SUT TI-KNOT TK 5X26 (MISCELLANEOUS) ×1 IMPLANT
DEVICE SUTURE ENDOST 10MM (ENDOMECHANICALS) IMPLANT
DRAPE CAMERA CLOSED 9X96 (DRAPES) ×2 IMPLANT
ELECT REM PT RETURN 9FT ADLT (ELECTROSURGICAL) ×2
ELECTRODE REM PT RTRN 9FT ADLT (ELECTROSURGICAL) ×1 IMPLANT
GLOVE BIOGEL PI IND STRL 7.0 (GLOVE) ×1 IMPLANT
GLOVE BIOGEL PI INDICATOR 7.0 (GLOVE) ×1
GLOVE SS BIOGEL STRL SZ 7.5 (GLOVE) ×1 IMPLANT
GLOVE SUPERSENSE BIOGEL SZ 7.5 (GLOVE) ×1
GOWN STRL NON-REIN LRG LVL3 (GOWN DISPOSABLE) ×4 IMPLANT
GOWN STRL REIN XL XLG (GOWN DISPOSABLE) ×4 IMPLANT
HOVERMATT SINGLE USE (MISCELLANEOUS) ×2 IMPLANT
KIT BASIN OR (CUSTOM PROCEDURE TRAY) ×2 IMPLANT
NDL SPNL 22GX3.5 QUINCKE BK (NEEDLE) ×1 IMPLANT
NEEDLE SPNL 22GX3.5 QUINCKE BK (NEEDLE) ×2 IMPLANT
NS IRRIG 1000ML POUR BTL (IV SOLUTION) ×2 IMPLANT
PACK UNIVERSAL I (CUSTOM PROCEDURE TRAY) ×2 IMPLANT
PENCIL BUTTON HOLSTER BLD 10FT (ELECTRODE) ×2 IMPLANT
SCALPEL HARMONIC ACE (MISCELLANEOUS) IMPLANT
SCISSORS LAP 5X35 DISP (ENDOMECHANICALS) ×1 IMPLANT
SET IRRIG TUBING LAPAROSCOPIC (IRRIGATION / IRRIGATOR) IMPLANT
SLEEVE ADV FIXATION 5X100MM (TROCAR) ×2 IMPLANT
SOLUTION ANTI FOG 6CC (MISCELLANEOUS) ×2 IMPLANT
SPONGE LAP 18X18 X RAY DECT (DISPOSABLE) ×2 IMPLANT
STAPLER VISISTAT 35W (STAPLE) ×2 IMPLANT
SUT ETHIBOND 2 0 SH (SUTURE)
SUT ETHIBOND 2 0 SH 36X2 (SUTURE) ×3 IMPLANT
SUT MNCRL AB 4-0 PS2 18 (SUTURE) ×2 IMPLANT
SUT PROLENE 2 0 CT2 30 (SUTURE) ×1 IMPLANT
SUT SILK 0 (SUTURE)
SUT SILK 0 30XBRD TIE 6 (SUTURE) ×1 IMPLANT
SUT SURGIDAC NAB ES-9 0 48 120 (SUTURE) IMPLANT
SUT VIC AB 0 UR5 27 (SUTURE) ×3 IMPLANT
SUT VIC AB 2-0 SH 27 (SUTURE)
SUT VIC AB 2-0 SH 27X BRD (SUTURE) ×1 IMPLANT
SYR 20CC LL (SYRINGE) ×2 IMPLANT
SYR CONTROL 10ML LL (SYRINGE) ×2 IMPLANT
SYS KII OPTICAL ACCESS 15MM (TROCAR) ×2
SYSTEM KII OPTICAL ACCESS 15MM (TROCAR) ×1 IMPLANT
TOWEL OR 17X26 10 PK STRL BLUE (TOWEL DISPOSABLE) ×2 IMPLANT
TROCAR ADV FIXATION 11X100MM (TROCAR) IMPLANT
TROCAR BLADELESS OPT 5 100 (ENDOMECHANICALS) ×2 IMPLANT
TROCAR XCEL NON-BLD 11X100MML (ENDOMECHANICALS) ×1 IMPLANT
TROCAR Z-THREAD FIOS 5X100MM (TROCAR) ×2 IMPLANT
TUBE CALIBRATION LAPBAND (TUBING) ×2 IMPLANT
TUBING INSUFFLATION 10FT LAP (TUBING) ×2 IMPLANT

## 2011-09-21 NOTE — Anesthesia Preprocedure Evaluation (Signed)
Anesthesia Evaluation  Patient identified by MRN, date of birth, ID band Patient awake    Reviewed: Allergy & Precautions, H&P , NPO status , Patient's Chart, lab work & pertinent test results  History of Anesthesia Complications Negative for: history of anesthetic complications  Airway Mallampati: II TM Distance: >3 FB Neck ROM: Full    Dental  (+) Teeth Intact, Caps, Implants and Dental Advisory Given   Pulmonary sleep apnea (Severe, noncompliant with mask) ,    Pulmonary exam normal       Cardiovascular negative cardio ROS  Rhythm:Regular Rate:Normal     Neuro/Psych Anxiety Depression  Neuromuscular disease    GI/Hepatic Neg liver ROS, hiatal hernia, GERD-  Medicated,  Endo/Other  Diabetes mellitus-, Type 2Hypothyroidism Morbid obesity  Renal/GU negative Renal ROS Bladder dysfunction      Musculoskeletal negative musculoskeletal ROS (+)   Abdominal (+) + obese,   Peds negative pediatric ROS (+)  Hematology negative hematology ROS (+)   Anesthesia Other Findings Bridge upper left incisors  Reproductive/Obstetrics negative OB ROS                           Anesthesia Physical Anesthesia Plan  ASA: III  Anesthesia Plan: General   Post-op Pain Management:    Induction: Intravenous  Airway Management Planned: Oral ETT  Additional Equipment:   Intra-op Plan:   Post-operative Plan: Extubation in OR  Informed Consent:   Dental advisory given  Plan Discussed with: CRNA  Anesthesia Plan Comments:         Anesthesia Quick Evaluation

## 2011-09-21 NOTE — Brief Op Note (Signed)
09/21/2011  11:23 AM  PATIENT:  [name] [name], 51 y.o., female, MRN: [id number]  PREOP DIAGNOSIS:  remove lap band pain   POSTOP DIAGNOSIS:   Failed lap band, persistant nasea  PROCEDURE:   Procedure(s):LAPAROSCOPIC REMOVAL OF GASRIC BAND  SURGEON:   Ovidio Kin, M.D.  Threasa HeadsTrude Mcburney, M.D.  ANESTHESIA:   general  Einar Pheasant, MD - Anesthesiologist Edison Pace - CRNA  General  EBL:  minimal  ml  BLOOD ADMINISTERED: none  DRAINS: none   LOCAL MEDICATIONS USED:   30 cc 1/4% marcaine  SPECIMEN:   none  COUNTS CORRECT:  YES  INDICATIONS FOR PROCEDURE:  [name] [name] is a 51 y.o. (DOB: 1961/04/18) white female whose primary care physician is Kari Baars, MD, MD and comes for removal of lap band.  She has had persistent nausea with the lap band   The indications and risks of the surgery were explained to the patient.  The risks include, but are not limited to, infection, bleeding, and nerve injury.  Note dictated to:   #[id number]

## 2011-09-21 NOTE — Interval H&P Note (Signed)
History and Physical Interval Note:  09/21/2011 9:03 AM  [name] [name]  has presented today for surgery, with the diagnosis of remove lap band pain   The various methods of treatment have been discussed with the patient and family. She has a friend, [name], and aunt, [name], with her today.  She has continued to vomit and wants the lap band removed.  After consideration of risks, benefits and other options for treatment, the patient has consented to  Procedure(s) (LRB): REMOVAL OF GASRIC BAND (N/A) as a surgical intervention .    The patients' history has been reviewed, patient examined, no change in status, stable for surgery.  I have reviewed the patients' chart and labs.  Questions were answered to the patient's satisfaction.     Akaash Vandewater H

## 2011-09-21 NOTE — Anesthesia Postprocedure Evaluation (Signed)
Anesthesia Post Note  Patient: [name] [name]  Procedure(s) Performed: Procedure(s) (LRB): LAPAROSCOPIC REPAIR AND REMOVAL OF GASRIC BAND (N/A)  Anesthesia type: General  Patient location: PACU  Post pain: Pain level controlled  Post assessment: Post-op Vital signs reviewed  Last Vitals:  Filed Vitals:   09/21/11 1200  BP: 116/63  Pulse:   Temp:   Resp:     Post vital signs: Reviewed  Level of consciousness: sedated  Complications: No apparent anesthesia complications

## 2011-09-21 NOTE — Transfer of Care (Signed)
Immediate Anesthesia Transfer of Care Note  Patient: [name] [name]  Procedure(s) Performed: Procedure(s) (LRB): LAPAROSCOPIC REPAIR AND REMOVAL OF GASRIC BAND (N/A)  Patient Location: PACU  Anesthesia Type: General  Level of Consciousness: awake, alert , oriented and patient cooperative  Airway & Oxygen Therapy: Patient Spontanous Breathing  Post-op Assessment: Report given to PACU RN, Post -op Vital signs reviewed and stable and Patient moving all extremities  Post vital signs: Reviewed and stable  Complications: No apparent anesthesia complications

## 2011-09-21 NOTE — H&P (View-Only) (Signed)
[facility]  [name] Kin, MD,  FACS [address]  [address] [place], [place]    [address] Phone:  [phone] FAX:  [phone]   Re:   [name] [name] DOB:   1960/10/15 MRN:   [id number]  ASSESSMENT AND PLAN: 1. Lap band, APS - 03/20/2007  Initial weight - 337, BMI - 52.9.  I saw her 08/04/2011 and had a long discussion about removing the lap band.  The note (much copied to this note) outlines her history and wishes.  I discussed the risks of surgery and removing the lap band.  The risks included bleeding, infection, perforation of bowel, open surgery, and the chance that the surgery may not improve her nausea.  I discussed with her that there could be other causes for her nausea.  She seems to understand this.  I told her I could also look in her LUQ for a cause for her abdominal pain.  She seems to have less LUQ complaints today.  I withdrew all the fluid from her lap band last visit.  She is on for surgery to remove the lap band on 09/21/2011.  2. Morbid obesity - Today's weight - 321, BMI - 50.4 (actually down 8 pounds from when I last saw her.)  3. Hyperlipidemia.  4. Hypertension.   On lisonopril, though she says that she in not hypertensive. 5. Sleep apnea but cannot tolerate CPAP.   Claustrophobia with mask 6. Gastroesophageal reflux disease.  7. History of bladder pacemaker placed by Dr. Lorin Picket [name].  8. Dermatoid disease.  9.  Neuropathy involving LE.    Has been on nuerontin. 10. Hypothyroid.  11. History of depression.   Sees [name] [name] in [place] from a psych standpoint.  Her husband left her last summer and this has devastated her. 12. History of restless leg syndrome. 13.  Rheumatoid arthritis.  It is acting up more today.  On methotrexate.  Has been on Percocet, though she says she has not had any in 2 weeks.  Sees Dr. Zenovia [name]. 14.  Diastasis recti.  She thought this was a hernia. 15.  She has been on chronic percocet. She  has tried stay away from this the last couple of months. 16.  Her allergies are acting up.  She said the air conditioner at her house is broken. 17.  She showed me a rash across her lower abdomen under her pannus.  This looks like a superficial fungal rash.  I suggested she try some over the counter anti fungal cream/powder.   HISTORY OF PRESENT ILLNESS: Chief Complaint  Patient presents with  . Bariatric Pre-op    lap band removal    [name] [name] is a 51 y.o. (DOB: [birth year])  white female who is a patient of [name] Baars, MD, MD and comes to me today for follow up Lap Band.  She wants her lap band removed and she is scheduled 09/21/2011.  [Much of this note is from her last visit.] I have not seen the patient since 01/16/2008.  She says that she has not seen me since 2009 because I was "hateful"  and I made her cry.  I re-read my note and at that time I was worried about her effort with the lap band regarding how well she was following the diet and how well she was trying to include exercise in her life.  But in the last 3 years, [name] [name] has had almost daily nausea and vomiting.  She  says that she cannot keep anything down.  She says that she "throws up all the time."  She can eat mashed potatoes.  But despite the complaint of chronic nausea and vomiting, she has maintained her weight.  She also complains of LUQ pain that migrates.  She is convinced the lap band may be causing her pain.  But I reviewed with her that she can probably not feel the band.  She said the abdominal pain was so bad in April, 2013, (2 days before last visit) that she went [facility]. It does not sound like they did much.  (I have been unable to get those records.)  I have no records from that visit.  She did have a history of a "strangulated hernia" in abdominal wall, though she could not remember when this happened.  She had a lap chole in [place] in 2000.  She had a vaginal hysterectomy 2003 for benign disease.  She had a bladder pacemaker by Dr. Kathie [name]. [name] in 2005.  She has had no fever, no change in bowel habits, no surgery since I last saw her.  She has been traumatized emotionally in that her husband left her last summer (2012).  She has a daughter who is 67 who is on disability.  It sounds like [name] [name] and her daughter are co-dependent on each other. [name] [name] is also applying for disability.  Her main new issues are RA and neuropathy of the LE.  She had a CT scan on 05/31/2010 showed the lap band in reasonable position.  It looks like she got the CT scan to r/o PE.  At first when I talked to her, she did not remember doing this test.  Past Medical History  Diagnosis Date  . Anxiety   . Morbid obesity   . Depression   . Panic attacks   . Diabetes mellitus   . GERD (gastroesophageal reflux disease)   . Fluid retention   . IBS (irritable bowel syndrome)   . Sleep apnea   . Esophagitis   . Hiatal hernia   . Neurogenic bladder     has pacemaker  . Arthritis     Rheumatoid  . Neuropathy     Current Outpatient Prescriptions  Medication Sig Dispense Refill  . clotrimazole-betamethasone (LOTRISONE) cream as needed.      Marland Kitchen FLUoxetine (PROZAC) 20 MG capsule Take 20 mg by mouth 4 (four) times daily.      . folic acid (FOLVITE) 1 MG tablet Twice daily.      Marland Kitchen gabapentin (NEURONTIN) 300 MG capsule Three times a day.      . hydrOXYzine (ATARAX/VISTARIL) 50 MG tablet Twice daily.      Marland Kitchen levothyroxine (SYNTHROID, LEVOTHROID) 50 MCG tablet Take 50 mcg by mouth daily.      Marland Kitchen lisinopril-hydrochlorothiazide (PRINZIDE,ZESTORETIC) 20-25 MG per tablet Take 1 tablet by mouth daily.      . methotrexate 1 G injection Inject into the vein once a week.      . nitrofurantoin, macrocrystal-monohydrate, (MACROBID) 100 MG capsule Twice daily.      Marland Kitchen oxyCODONE-acetaminophen (PERCOCET) 5-325 MG per tablet Take 1 tablet by mouth as needed.      . promethazine (PHENERGAN) 25 MG tablet Take 25 mg by mouth as needed.       . traZODone (DESYREL) 100 MG tablet Take 100 mg by mouth 2 (two) times daily.       Allergies  Allergen Reactions  . Codeine Palpitations    "  makes heart fly, she gets flushed and passes out"  . Sulfa Antibiotics Palpitations    "makes heart fly, she gets flushed and passes out"    PHYSICAL EXAM: BP 120/82  Pulse 66  Temp(Src) 97.2 F (36.2 C) (Temporal)  Resp 18  Ht 5\' 7"  (1.702 m)  Wt 321 lb (145.605 kg)  BMI 50.28 kg/m2  LMP 04/20/2001  General: Morbidly obese WF who is alert.  HEENT: Normal. Pupils equal. Good dentition. Neck: Supple. No mass.  No thyroid mass.  Lymph Nodes:  No supraclavicular or cervical nodes. Lungs: Clear to auscultation and symmetric breath sounds. Heart:  RRR. No murmur or rub.  Abdomen: Soft. No mass. No tenderness. No hernia. She still has a very large abdomen which limits the physical exam. Lap band in RUQ.  She does have a diastasis recti. She has a fungal infection under her pannus.  But there is no cellulitis. Rectal: Not done. Extremities:  She is walking slowly today.  She says her RA is acting up. Neurologic:  Complains of numbness in feet.  Slow to get on exam table. Psychiatric: Behavior is normal.   DATA REVIEWED: CT scan from 05/2010. Addendum:  Records from [facility] - 08/02/2011.  Lipase - 131, Hgb - 11.5, Albumin - 3.3, UA showed 61 WBC/hpf and 3+ bacteria - their impression was that she had a UTI.  [name] Kin, MD, FACS Office:  [phone]

## 2011-09-22 NOTE — Progress Notes (Signed)
Pt attempting to get out of bed to go to bathroom but unable to make it in time; voided in bed; assisted pt with cleaning self up and to chair; pt tolerated fair; denies any nausea or vomiting; tolerating fluids well;  encouraged increased ambulation and pt verbalized understanding of. Talmadge Chad, RN Bariatric Nurse coordinator

## 2011-09-22 NOTE — Op Note (Signed)
[name], [name] NO.:  [id number]  MEDICAL RECORD NO.:  [id number]  LOCATION:  1523                         FACILITY:  [facility]  PHYSICIAN:  [name] [name], M.D.  DATE OF BIRTH:  [birth year]  DATE OF PROCEDURE:  09/21/2011                              OPERATIVE REPORT  PREOPERATIVE DIAGNOSIS:  Failed laparoscopic banding, persistent nausea, morbid obesity.  POSTOPERATIVE DIAGNOSIS:  Failed laparoscopic banding, persistent nausea, morbid obesity Weight - 321 and body mass index - 50.3).  PROCEDURE:  Laparoscopic removal of lap band.  SURGEON:  [name] [name], M.D.  FIRST ASSISTANT:  [name] Pilot, MD.  ANESTHESIA:  General endotracheal.  ESTIMATED BLOOD LOSS:  Minimal.  Local use was 30 cc of 0.25% Marcaine.  COMPLICATIONS:  None  INDICATION:  [name] is a 51 year old white female, who is a patient of Dr. Buren [name] in [place].  I placed a lap band on her for morbid obesity on March 20, 2007.  Her initial weight at that time was 337 with BMI of 53.  So, she is down 30+ pounds..  But she has had persistent nausea and vomiting and she has been unhappy with the band.  She has not kept good followup with our office regarding her band, which I explained to her may be part of the reason this has failed.  I offered to try to work with her to make the lap band more of a success, but she did not want to pursue any further management with the band removal and she just wants the band removed.  She has had abdominal pain and she went to [facility] ER in April 2013. It does not sound like they did much.  She requests removal of the band. The indications, potential risks of band removal were explained to the patient.  Potential risks include bleeding, infection, bowel injury, and it may not resolve her symptoms.  Otherwise, her symptoms may not be solely from the band.  OPERATIVE NOTE:  The patient was taken to the room #1 and underwent a general endotracheal  anesthetic supervised by Dr. Lucille [name]. Her abdomen was prepped with ChloraPrep and sterilely draped.    A time out was held and the surgical check list reviewed.    I accessed her abdominal cavity through a left upper quadrant with an 11-mm Optiview.  I then placed 4 additional trocars.  I placed a 5-mm subxiphoid, a 15-mm in the right subcostal (to remove the band parts), a 5 mm in the right Paramedian, and a 5 mm left paramedian for the camera.  Upon doing exploration, she did have some adhesions to her lower abdomen from her prior abdominal surgery.  She also had some adhesions to the upper abdomen around the band and the tubing from the lap band.  The tubing adhesions were easily taken down with scissors and blunt dissection.  I dissected the left lobe of liver, unbuckled the band, and the cut the band in 2 pieces and removed it through the 15- mm trocar.  This left in the silastic tubing in the abdomen.  I irrigated the abdomen.  There was no leak or bowel injury.  There  was no evidence of any infection.    With the trocars still in the abdominal cavity I cut down over the right upper quadrant port.  I was able to remove it.  The band was an AP standard band.  The port was sewn to the fascia only and had no backing.  I did  pull together some of the fibrous tissue around where the band was, but I do not think I did any damage to the abdominal wall.    With all the trocars removed, I then infiltrated the skin and subcutaneous tissue with 30 mL of 0.25% Marcaine.  The skin was closed with a running 5-0 Monocryl and 5-0 Vicryl.  The wounds were painted with Dermabond.    She tolerated the procedure well.  The sponge and needle count were correct at the end of the case.  She was transferred to recovery room in good condition.   [name] [name], M.D., FACS   DHN/MEDQ  D:  09/21/2011  T:  09/22/2011  Job:  [id number]  cc:   [name] [name], M.D. Fax: [phone]

## 2011-09-22 NOTE — Progress Notes (Signed)
General Surgery Note  LOS: 1 day  POD# 1  Assessment/Plan:  1.  LAPAROSCOPIC REMOVAL OF GASRIC BAND - 09/21/2011 - D. Pancho Rushing  No suprisingly, patient says she is too unstable to go home.   She has only been out of bed to bathroom.  Will keep at least one more day, keep on same IVF, encourage to ambulate and use IS. 2. Morbid obesity -  weight - 321, BMI - 50.4 3. Hyperlipidemia.  4. Hypertension.   On lisonopril, though she says that she in not hypertensive.   5. Sleep apnea but cannot tolerate CPAP.   Claustrophobia with mask.  She desats to 80% at night while asleep. 6. Gastroesophageal reflux disease.  7. History of bladder pacemaker placed by Dr. Lorin Picket McDiarmid.  8. Dermatoid disease.   9. Neuropathy involving LE.   Has been on nuerontin.  10. Hypothyroid.  11. History of depression.   Sees Clay Pex in [place] from a psych standpoint.   Her husband left her last summer and this has devastated her.  12. History of restless leg syndrome.   13. Rheumatoid arthritis. It is acting up more today.   On methotrexate. Has been on Percocet, though she says she has not had any in 2 weeks.   Sees Dr. Zenovia Jordan.  14. Diastasis recti. She thought this was a hernia.  15. She has been on chronic percocet. She has tried stay away from this the last couple of months.  16. Her allergies are acting up. She said the air conditioner at her house is broken.  17.  VTE - on SQ heparin.  I stressed the need for ambulation to her.  Subjective:  Feels too weak to go home.  No vomiting and almost no nausea. Objective:   Filed Vitals:   09/22/11 0620  BP: 104/66  Pulse: 71  Temp: 99.7 F (37.6 C)  Resp: 18     Intake/Output from previous day:  06/04 0701 - 06/05 0700 In: 3745 [P.O.:320; I.V.:3425] Out: 1300 [Urine:1300]  Intake/Output this shift:      Physical Exam:   General: Obese WF who is alert and oriented.    HEENT: Normal. .   Lungs: Clear.  IS at 1000cc   Abdomen:  Few BS. Big.   Wound: Clean.   Neurologic:  Grossly intact to motor and sensory function.   Psychiatric: Flat affect. Poor motivation.   Lab Results:   No results found for this basename: WBC:2,HGB:2,HCT:2,PLT:2 in the last 72 hours  BMET   Basename 09/21/11 1431  NA --  K --  CL --  CO2 --  GLUCOSE --  BUN --  CREATININE 0.72  CALCIUM --    PT/INR  No results found for this basename: LABPROT:2,INR:2 in the last 72 hours  ABG  No results found for this basename: PHART:2,PCO2:2,PO2:2,HCO3:2 in the last 72 hours   Studies/Results:  No results found.   Anti-infectives:   Anti-infectives     Start     Dose/Rate Route Frequency Ordered Stop   09/21/11 0725   ceFAZolin (ANCEF) IVPB 2 g/50 mL premix        2 g 100 mL/hr over 30 Minutes Intravenous 60 min pre-op 09/21/11 0725 09/21/11 0937          Ovidio Kin, MD, FACS Pager: [phone],   [facility] Office: [phone] 09/22/2011

## 2011-09-23 NOTE — Progress Notes (Signed)
General Surgery Note  LOS: 2 days  POD# 2  Assessment/Plan:  1.  LAPAROSCOPIC REMOVAL OF GASRIC BAND - 09/21/2011 - D. [employer] better today.  Taking liquids well and ambulated in hall last night.  Plan discharge home today.  She has percocet at home provided by her RA physician, Dr. Nickola Major.  D/C# [id number]  2. Morbid obesity -  weight - 321, BMI - 50.4 3. Hyperlipidemia.  4. Hypertension.   On lisonopril, though she says that she in not hypertensive.   5. Sleep apnea but cannot tolerate CPAP.   Claustrophobia with mask.  She desats to 80% at night while asleep. 6. Gastroesophageal reflux disease.  7. History of bladder pacemaker placed by Dr. Lorin Picket McDiarmid.  8. Dermatoid disease.   9. Neuropathy involving LE.   Has been on nuerontin.  10. Hypothyroid.  11. History of depression.   Sees Clay Pex in [place] from a psych standpoint.   Her husband left her last summer and this has devastated her.  12. History of restless leg syndrome.   13. Rheumatoid arthritis.   On methotrexate. Has been on Percocet.  Sees Dr. Zenovia Jordan.  14. Diastasis recti. She thought this was a hernia.  15. She has been on chronic percocet.  16.  VTE - on SQ heparin.  I stressed the need for ambulation to her.  Subjective:  Feels better today.  Ready to go home. Objective:   Filed Vitals:   09/23/11 0602  BP: 114/77  Pulse: 63  Temp: 97.9 F (36.6 C)  Resp: 20     Intake/Output from previous day:  06/05 0701 - 06/06 0700 In: 720 [P.O.:720] Out: 3100 [Urine:3100]  Intake/Output this shift:      Physical Exam:   General: Obese WF who is alert and oriented.    HEENT: Normal. .   Lungs: Clear.    Abdomen: Soft.  BS present.   Wound: Clean.   Neurologic:  Grossly intact to motor and sensory function.   Psychiatric: Flat affect, but smiling today.   Lab Results:   No results found for this basename: WBC:2,HGB:2,HCT:2,PLT:2 in the last 72 hours  BMET    Basename 09/21/11  1431  NA --  K --  CL --  CO2 --  GLUCOSE --  BUN --  CREATININE 0.72  CALCIUM --    PT/INR  No results found for this basename: LABPROT:2,INR:2 in the last 72 hours  ABG  No results found for this basename: PHART:2,PCO2:2,PO2:2,HCO3:2 in the last 72 hours   Studies/Results:  No results found.   Anti-infectives:   Anti-infectives     Start     Dose/Rate Route Frequency Ordered Stop   09/21/11 0725   ceFAZolin (ANCEF) IVPB 2 g/50 mL premix        2 g 100 mL/hr over 30 Minutes Intravenous 60 min pre-op 09/21/11 0725 09/21/11 0937          Ovidio Kin, MD, FACS Pager: [phone],   [facility] Office: [phone] 09/23/2011

## 2011-09-23 NOTE — Progress Notes (Signed)
Pt alert and oriented and states she feels much better today; denies any nausea or vomiting; tolerating fluids well; has seen Dr. Ezzard Standing and received her discharge instructions and will call [facility] today to schedule her follow up appt. Talmadge Chad, RN Bariatric Nurse Coordinator

## 2011-09-23 NOTE — Progress Notes (Signed)
Pt able to ambulate, once around unit with walker and a steady  assist X1 . Pt tolerated ambulation well. Pt with one episode of incontinence of urine.  Assisted pt with personal hygiene And new linens placed. Pt. On continuous pox, noted while sleeping sats drop to  88-89%.the patient  Has history of sleep apnea. Nasal cannula @ 2L maintained while pt sleeps.  Will continue to monitor pt per M.D. protocol

## 2011-09-23 NOTE — Discharge Instructions (Signed)
[facility] - DISCHARGE INSTRUCTIONS TO PATIENT  Activity:  Driving - may drive in 3 to 4 days, if doing well   Lifting - no lifting x 7 days, then no limits  Wound Care:   May shower  Diet:  Take liquids for one to 2 more days, then slowly add regular food back.  Follow up appointment:  Call Dr. Allene Pyo office [facility]) at [phone] for an appointment in 3 to 4 weeks.  Medications and dosages:  Resume your home medications.  Call Dr. Ezzard Standing or his office  [phone]) if you have:  Temperature greater than 100.4,  Persistent nausea and vomiting,  Severe uncontrolled pain,  Redness, tenderness, or signs of infection (pain, swelling, redness, odor or green/yellow discharge around the site),  Difficulty breathing, headache or visual disturbances,  Any other questions or concerns you may have after discharge.  In an emergency, call 911 or go to an Emergency Department at a nearby hospital.

## 2011-09-23 NOTE — Progress Notes (Signed)
Pt DC to home. Dc instructions and medications reviewed with Pt. Pt states understanding. All Pt questions were answered

## 2011-09-24 NOTE — Discharge Summary (Signed)
[name] [name], [name] NO.:  [id number]  MEDICAL RECORD NO.:  [id number]  LOCATION:  1523                         FACILITY:  [facility]  PHYSICIAN:  Sandria Bales. Ezzard Standing, M.D.  DATE OF BIRTH:  [birth year]  DATE OF ADMISSION:  09/21/2011 DATE OF DISCHARGE:  09/23/2011                              DISCHARGE SUMMARY  DISCHARGE DIAGNOSES: 1. Difficulty with lap band including nausea, vomiting. 2. Morbid obesity.  Weight 321 pounds, BMI 50.4. 3. Hyperlipidemia. 4. Hypertension. 5. Sleep apnea. 6. Gastroesophageal reflux disease. 7. History of bladder pacemaker placed by Dr. Jacquelyne Balint. 8. History of rheumatoid disease. 9. Neuropathy involving her lower extremities. 10.Hypothyroidism. 11.History of depression, seen by Dr. Mat Carne Pax of [place]. 12.Restless legs syndrome. 13.Rheumatoid arthritis, followed by Dr. Zenovia Jordan. 14.Diastasis recti. 15.History of chronic use of Percocet.  OPERATION PERFORMED:  The patient had laparoscopic removal of gastric band on September 21, 1998.  HISTORY OF ILLNESS:  [name] is a 51 year old white female who is a patient Dr. Buren Kos who had a lap band placed on January 16, 2008 for morbid obesity.  She has not done a good job of following up with our office regarding the lap band and I think there is some communication problems between myself and her.  Over the last 3 years she says she has had almost daily vomiting and Nausea.  I pointed out the incongruity that though she states that she has not been able to keep food down, she has been able to sustain her weight.  She says she throws up all the time and complains of some vague left upper quadrant pain.  This got so bad that she has been to the emergency room at least a couple times.  For this reason, she wanted to have her lap band removed.  I offered to try to continue to monitor and manage her band, but she thinks she has tried hard enough to manage the lap band and she wants it  out.  So with this understanding she came to [facility] for removal of the lap band.  CO-MORBID PROBLEMS:  Include: 1. Hypertension. 2. Hyperlipidemia. 3. Sleep apnea. 4. Gastroesophageal reflux disease. 5. History of bladder pacemaker placed by Dr. Roselee Nova. 6. Dermatoid disease. 7. Neuropathy involving her lower extremities. 8. She is hypothyroid. 9. History of depression, seen by seen by Dr. Mat Carne Pax in [place]. 10.History of restless legs syndrome. 11.Rheumatoid arthritis followed by Dr. Festus Holts. 12.Diastasis recti.  HOSPITAL COURSE:  She was taken to the operating room on the day of admission where she underwent a laparoscopic removal of her gastric band.    Her first postop day, she was fairly miserable.  I think she had trouble getting out of bed, felt lightheaded, and did not feel ready to go home.  So I kept her for a second day.  She is now 2 days postop, she is afebrile, she is keeping liquids down.  She has able been to ambulate in the hall.  Her abdominal incision looked good.  She is ready for discharge.  DISCHARGE INSTRUCTIONS:  Include resuming her home medication.  She has, she says, enough Percocet that  she gets through Dr. Nickola Major for rheumatoid arthritis.  So she did not get any pain medicine from me.  She should resume all her other medicines.  She can start showering today.  She has a rash in her lower abdomen, it is probably a fungal rash.  I have encouraged her to try some over-the- counter antifungal creams for this.  She should not drive for 3-4 days until she is feeling good.  She is to slowly advance her diet.  She is to see me back in 3-4 weeks for followup wound check.  I will see how she is doing.   Sandria Bales. Ezzard Standing, M.D., FACS   DHN/MEDQ  D:  09/23/2011  T:  09/24/2011  Job:  [id number]  cc:   Zenovia Jordan, MD Fax: [phone]  Kari Baars, M.D. Fax: [phone]  Dr. Mat Carne [facility]

## 2011-10-08 ENCOUNTER — Ambulatory Visit (INDEPENDENT_AMBULATORY_CARE_PROVIDER_SITE_OTHER): Payer: BC Managed Care – PPO | Admitting: Surgery

## 2011-10-08 ENCOUNTER — Telehealth (INDEPENDENT_AMBULATORY_CARE_PROVIDER_SITE_OTHER): Payer: Self-pay | Admitting: General Surgery

## 2011-10-08 NOTE — Telephone Encounter (Signed)
Pt called to cancel her appt for today---she is moving and must vacate her house by today, so she cannot break from moving to attend appt.   New phone number (where she is going) is [phone].  She reports she is feeling much better since lap band removed and "doing great."

## 2011-10-20 ENCOUNTER — Ambulatory Visit (INDEPENDENT_AMBULATORY_CARE_PROVIDER_SITE_OTHER): Payer: BC Managed Care – PPO | Admitting: Surgery

## 2011-10-20 ENCOUNTER — Encounter (INDEPENDENT_AMBULATORY_CARE_PROVIDER_SITE_OTHER): Payer: Self-pay | Admitting: Surgery

## 2011-10-20 VITALS — BP 120/88 | HR 60 | Temp 96.4°F | Resp 18 | Ht 67.0 in | Wt 320.0 lb

## 2011-10-20 DIAGNOSIS — Z9884 Bariatric surgery status: Secondary | ICD-10-CM

## 2011-10-20 NOTE — Progress Notes (Signed)
MILICA GULLY [id number]  [name] had her lap band was removed by me on 09/21/2011.  She had chronic symptoms of nausea and vomiting before surgery and these have resolved.  She has no new issues.  Her wounds look good.  Her return to me is on a PRN basis.  Ovidio Kin, MD, [facility] Pager: [phone] Office phone:  [phone]

## 2012-01-06 DIAGNOSIS — F603 Borderline personality disorder: Secondary | ICD-10-CM | POA: Insufficient documentation

## 2012-01-06 DIAGNOSIS — F419 Anxiety disorder, unspecified: Secondary | ICD-10-CM | POA: Insufficient documentation

## 2012-01-06 DIAGNOSIS — F418 Other specified anxiety disorders: Secondary | ICD-10-CM | POA: Insufficient documentation

## 2012-01-06 DIAGNOSIS — F329 Major depressive disorder, single episode, unspecified: Secondary | ICD-10-CM | POA: Insufficient documentation

## 2012-01-06 DIAGNOSIS — F32A Depression, unspecified: Secondary | ICD-10-CM | POA: Insufficient documentation

## 2012-04-15 DIAGNOSIS — E559 Vitamin D deficiency, unspecified: Secondary | ICD-10-CM | POA: Insufficient documentation

## 2012-07-07 ENCOUNTER — Ambulatory Visit: Payer: Self-pay | Admitting: Family Medicine

## 2012-07-07 ENCOUNTER — Observation Stay: Payer: Self-pay | Admitting: Internal Medicine

## 2012-07-07 LAB — TROPONIN I
Troponin-I: <0.02 ng/mL
Troponin-I: <0.02 ng/mL

## 2012-07-07 LAB — CBC
HCT: 35.5 % (ref 35.0–47.0)
HGB: 11.5 g/dL — ABNORMAL LOW (ref 12.0–16.0)
MCH: 26 pg (ref 26.0–34.0)
MCHC: 32.3 g/dL (ref 32.0–36.0)
MCV: 80 fL (ref 80–100)
Platelet: 245 10*3/uL (ref 150–440)
RBC: 4.41 10*6/uL (ref 3.80–5.20)
RDW: 16.9 % — ABNORMAL HIGH (ref 11.5–14.5)
WBC: 7.2 10*3/uL (ref 3.6–11.0)

## 2012-07-07 LAB — BASIC METABOLIC PANEL
Anion Gap: 6 — ABNORMAL LOW (ref 7–16)
BUN: 9 mg/dL (ref 7–18)
Calcium, Total: 8.5 mg/dL (ref 8.5–10.1)
Chloride: 103 mmol/L (ref 98–107)
Co2: 30 mmol/L (ref 21–32)
Creatinine: 0.58 mg/dL — ABNORMAL LOW (ref 0.60–1.30)
EGFR (African American): >60
EGFR (Non-African Amer.): >60
Glucose: 132 mg/dL — ABNORMAL HIGH (ref 65–99)
Osmolality: 278 (ref 275–301)
Potassium: 3.8 mmol/L (ref 3.5–5.1)
Sodium: 139 mmol/L (ref 136–145)

## 2012-07-07 LAB — CK TOTAL AND CKMB (NOT AT ARMC)
CK, Total: 46 U/L (ref 21–215)
CK, Total: 48 U/L (ref 21–215)
CK-MB: 0.6 ng/mL (ref 0.5–3.6)
CK-MB: <0.5 ng/mL — ABNORMAL LOW (ref 0.5–3.6)

## 2012-07-07 LAB — PRO B NATRIURETIC PEPTIDE: B-Type Natriuretic Peptide: 182 pg/mL — ABNORMAL HIGH (ref 0–125)

## 2012-07-08 DIAGNOSIS — I509 Heart failure, unspecified: Secondary | ICD-10-CM

## 2012-07-08 LAB — CK TOTAL AND CKMB (NOT AT ARMC)
CK, Total: 41 U/L (ref 21–215)
CK-MB: 0.5 ng/mL (ref 0.5–3.6)

## 2012-07-08 LAB — BASIC METABOLIC PANEL
Anion Gap: 5 — ABNORMAL LOW (ref 7–16)
BUN: 8 mg/dL (ref 7–18)
Calcium, Total: 8.3 mg/dL — ABNORMAL LOW (ref 8.5–10.1)
Chloride: 103 mmol/L (ref 98–107)
Co2: 31 mmol/L (ref 21–32)
Creatinine: 0.6 mg/dL (ref 0.60–1.30)
EGFR (African American): >60
EGFR (Non-African Amer.): >60
Glucose: 128 mg/dL — ABNORMAL HIGH (ref 65–99)
Osmolality: 278 (ref 275–301)
Potassium: 3.6 mmol/L (ref 3.5–5.1)
Sodium: 139 mmol/L (ref 136–145)

## 2012-07-08 LAB — TROPONIN I: Troponin-I: <0.02 ng/mL

## 2012-07-13 LAB — CULTURE, BLOOD (SINGLE)

## 2012-08-30 ENCOUNTER — Observation Stay: Payer: Self-pay | Admitting: Internal Medicine

## 2012-08-30 LAB — BASIC METABOLIC PANEL
Anion Gap: 7 (ref 7–16)
BUN: 15 mg/dL (ref 7–18)
Calcium, Total: 9.2 mg/dL (ref 8.5–10.1)
Chloride: 103 mmol/L (ref 98–107)
Co2: 26 mmol/L (ref 21–32)
Creatinine: 0.64 mg/dL (ref 0.60–1.30)
EGFR (African American): >60
EGFR (Non-African Amer.): >60
Glucose: 142 mg/dL — ABNORMAL HIGH (ref 65–99)
Osmolality: 275 (ref 275–301)
Potassium: 3.7 mmol/L (ref 3.5–5.1)
Sodium: 136 mmol/L (ref 136–145)

## 2012-08-30 LAB — CBC
HCT: 34.9 % — ABNORMAL LOW (ref 35.0–47.0)
HGB: 11.7 g/dL — ABNORMAL LOW (ref 12.0–16.0)
MCH: 26.7 pg (ref 26.0–34.0)
MCHC: 33.6 g/dL (ref 32.0–36.0)
MCV: 80 fL (ref 80–100)
Platelet: 250 10*3/uL (ref 150–440)
RBC: 4.39 10*6/uL (ref 3.80–5.20)
RDW: 16.6 % — ABNORMAL HIGH (ref 11.5–14.5)
WBC: 8.6 10*3/uL (ref 3.6–11.0)

## 2012-08-30 LAB — CK TOTAL AND CKMB (NOT AT ARMC)
CK, Total: 50 U/L (ref 21–215)
CK-MB: <0.5 ng/mL — ABNORMAL LOW (ref 0.5–3.6)

## 2012-08-30 LAB — URINALYSIS, COMPLETE
Bilirubin,UR: NEGATIVE
Glucose,UR: NEGATIVE mg/dL (ref 0–75)
Ketone: NEGATIVE
Leukocyte Esterase: NEGATIVE
Nitrite: POSITIVE
Ph: 5 (ref 4.5–8.0)
Protein: NEGATIVE
RBC,UR: 2 /HPF (ref 0–5)
Specific Gravity: 1.024 (ref 1.003–1.030)
Squamous Epithelial: 1
WBC UR: 2 /HPF (ref 0–5)

## 2012-08-30 LAB — TROPONIN I: Troponin-I: <0.02 ng/mL

## 2012-08-30 LAB — PRO B NATRIURETIC PEPTIDE: B-Type Natriuretic Peptide: 86 pg/mL (ref 0–125)

## 2012-08-31 DIAGNOSIS — I5033 Acute on chronic diastolic (congestive) heart failure: Secondary | ICD-10-CM

## 2012-08-31 LAB — BASIC METABOLIC PANEL
Anion Gap: 6 — ABNORMAL LOW (ref 7–16)
BUN: 14 mg/dL (ref 7–18)
Calcium, Total: 8.9 mg/dL (ref 8.5–10.1)
Chloride: 100 mmol/L (ref 98–107)
Co2: 28 mmol/L (ref 21–32)
Creatinine: 0.67 mg/dL (ref 0.60–1.30)
EGFR (African American): >60
EGFR (Non-African Amer.): >60
Glucose: 262 mg/dL — ABNORMAL HIGH (ref 65–99)
Osmolality: 278 (ref 275–301)
Potassium: 3.5 mmol/L (ref 3.5–5.1)
Sodium: 134 mmol/L — ABNORMAL LOW (ref 136–145)

## 2012-08-31 LAB — LIPID PANEL
Cholesterol: 189 mg/dL (ref 0–200)
HDL Cholesterol: 49 mg/dL (ref 40–60)
Ldl Cholesterol, Calc: 117 mg/dL — ABNORMAL HIGH (ref 0–100)
Triglycerides: 115 mg/dL (ref 0–200)
VLDL Cholesterol, Calc: 23 mg/dL (ref 5–40)

## 2012-08-31 LAB — MAGNESIUM: Magnesium: 1.6 mg/dL — ABNORMAL LOW

## 2012-08-31 LAB — CBC WITH DIFFERENTIAL/PLATELET
Basophil #: 0 10*3/uL (ref 0.0–0.1)
Basophil %: 0.3 %
Eosinophil #: 0 10*3/uL (ref 0.0–0.7)
Eosinophil %: 0 %
HCT: 35.2 % (ref 35.0–47.0)
HGB: 11.6 g/dL — ABNORMAL LOW (ref 12.0–16.0)
Lymphocyte #: 0.7 10*3/uL — ABNORMAL LOW (ref 1.0–3.6)
Lymphocyte %: 8.3 %
MCH: 26.6 pg (ref 26.0–34.0)
MCHC: 32.8 g/dL (ref 32.0–36.0)
MCV: 81 fL (ref 80–100)
Monocyte #: 0.2 x10 3/mm (ref 0.2–0.9)
Monocyte %: 2.4 %
Neutrophil #: 7.8 10*3/uL — ABNORMAL HIGH (ref 1.4–6.5)
Neutrophil %: 89 %
Platelet: 238 10*3/uL (ref 150–440)
RBC: 4.35 10*6/uL (ref 3.80–5.20)
RDW: 16.5 % — ABNORMAL HIGH (ref 11.5–14.5)
WBC: 8.8 10*3/uL (ref 3.6–11.0)

## 2012-08-31 LAB — TSH: Thyroid Stimulating Horm: 5.26 u[IU]/mL — ABNORMAL HIGH

## 2012-08-31 LAB — HEMOGLOBIN A1C: Hemoglobin A1C: 7.7 % — ABNORMAL HIGH (ref 4.2–6.3)

## 2012-09-01 ENCOUNTER — Telehealth: Payer: Self-pay

## 2012-09-01 NOTE — Telephone Encounter (Signed)
D/c today 5/16 Will attempt TCM #1 5/19

## 2012-09-01 NOTE — Telephone Encounter (Signed)
TCM  

## 2012-09-01 NOTE — Telephone Encounter (Signed)
Message copied by Marcelle Overlie on Fri Sep 01, 2012 10:46 AM ------      Message from: Coralee Rud      Created: Fri Sep 01, 2012 10:42 AM      Regarding: tcm/ph       09/26/12 with Dr Kirke Corin ------

## 2012-09-04 NOTE — Telephone Encounter (Signed)
TCM #1 # disconnected

## 2012-09-05 NOTE — Telephone Encounter (Signed)
TCM #2 # still disconnected

## 2012-09-14 ENCOUNTER — Encounter: Payer: Self-pay | Admitting: *Deleted

## 2012-09-14 ENCOUNTER — Telehealth: Payer: Self-pay

## 2012-09-14 NOTE — Telephone Encounter (Signed)
Message copied by [facility], Sahira Cataldi E on Thu Sep 14, 2012 12:13 PM ------      Message from: Iverson Alamin C      Created: Thu Sep 14, 2012 11:56 AM       TCM      Discharged [facility]:09/01/12      Appointment: 09/26/12      Records Printed ------

## 2012-09-14 NOTE — Telephone Encounter (Signed)
TCM date out of range

## 2012-09-19 ENCOUNTER — Inpatient Hospital Stay: Payer: Self-pay | Admitting: Internal Medicine

## 2012-09-19 LAB — BASIC METABOLIC PANEL
Anion Gap: 7 (ref 7–16)
BUN: 12 mg/dL (ref 7–18)
Calcium, Total: 8.4 mg/dL — ABNORMAL LOW (ref 8.5–10.1)
Chloride: 103 mmol/L (ref 98–107)
Co2: 26 mmol/L (ref 21–32)
Creatinine: 0.77 mg/dL (ref 0.60–1.30)
EGFR (African American): >60
EGFR (Non-African Amer.): >60
Glucose: 398 mg/dL — ABNORMAL HIGH (ref 65–99)
Osmolality: 288 (ref 275–301)
Potassium: 3.4 mmol/L — ABNORMAL LOW (ref 3.5–5.1)
Sodium: 136 mmol/L (ref 136–145)

## 2012-09-19 LAB — CBC
HCT: 33.8 % — ABNORMAL LOW (ref 35.0–47.0)
HGB: 11.3 g/dL — ABNORMAL LOW (ref 12.0–16.0)
MCH: 26.9 pg (ref 26.0–34.0)
MCHC: 33.3 g/dL (ref 32.0–36.0)
MCV: 81 fL (ref 80–100)
Platelet: 202 10*3/uL (ref 150–440)
RBC: 4.18 10*6/uL (ref 3.80–5.20)
RDW: 16.3 % — ABNORMAL HIGH (ref 11.5–14.5)
WBC: 5.5 10*3/uL (ref 3.6–11.0)

## 2012-09-19 LAB — TROPONIN I: Troponin-I: <0.02 ng/mL

## 2012-09-19 LAB — PRO B NATRIURETIC PEPTIDE: B-Type Natriuretic Peptide: 103 pg/mL (ref 0–125)

## 2012-09-20 DIAGNOSIS — I5033 Acute on chronic diastolic (congestive) heart failure: Secondary | ICD-10-CM

## 2012-09-20 DIAGNOSIS — R609 Edema, unspecified: Secondary | ICD-10-CM

## 2012-09-20 LAB — BASIC METABOLIC PANEL
Anion Gap: 6 — ABNORMAL LOW (ref 7–16)
BUN: 10 mg/dL (ref 7–18)
Calcium, Total: 8.2 mg/dL — ABNORMAL LOW (ref 8.5–10.1)
Chloride: 101 mmol/L (ref 98–107)
Co2: 30 mmol/L (ref 21–32)
Creatinine: 0.66 mg/dL (ref 0.60–1.30)
EGFR (African American): >60
EGFR (Non-African Amer.): >60
Glucose: 236 mg/dL — ABNORMAL HIGH (ref 65–99)
Osmolality: 281 (ref 275–301)
Potassium: 3.4 mmol/L — ABNORMAL LOW (ref 3.5–5.1)
Sodium: 137 mmol/L (ref 136–145)

## 2012-09-20 LAB — CBC WITH DIFFERENTIAL/PLATELET
Basophil #: 0 10*3/uL (ref 0.0–0.1)
Basophil %: 0.5 %
Eosinophil #: 0 10*3/uL (ref 0.0–0.7)
Eosinophil %: 0.1 %
HCT: 33.7 % — ABNORMAL LOW (ref 35.0–47.0)
HGB: 11.3 g/dL — ABNORMAL LOW (ref 12.0–16.0)
Lymphocyte #: 1.3 10*3/uL (ref 1.0–3.6)
Lymphocyte %: 20.3 %
MCH: 27 pg (ref 26.0–34.0)
MCHC: 33.6 g/dL (ref 32.0–36.0)
MCV: 80 fL (ref 80–100)
Monocyte #: 0.3 x10 3/mm (ref 0.2–0.9)
Monocyte %: 5.5 %
Neutrophil #: 4.6 10*3/uL (ref 1.4–6.5)
Neutrophil %: 73.6 %
Platelet: 210 10*3/uL (ref 150–440)
RBC: 4.19 10*6/uL (ref 3.80–5.20)
RDW: 16.6 % — ABNORMAL HIGH (ref 11.5–14.5)
WBC: 6.2 10*3/uL (ref 3.6–11.0)

## 2012-09-20 LAB — TROPONIN I
Troponin-I: <0.02 ng/mL
Troponin-I: <0.02 ng/mL

## 2012-09-20 LAB — LIPID PANEL
Cholesterol: 156 mg/dL (ref 0–200)
HDL Cholesterol: 32 mg/dL — ABNORMAL LOW (ref 40–60)
Ldl Cholesterol, Calc: 82 mg/dL (ref 0–100)
Triglycerides: 210 mg/dL — ABNORMAL HIGH (ref 0–200)
VLDL Cholesterol, Calc: 42 mg/dL — ABNORMAL HIGH (ref 5–40)

## 2012-09-20 LAB — TSH: Thyroid Stimulating Horm: 3.68 u[IU]/mL

## 2012-09-20 LAB — MAGNESIUM: Magnesium: 1.6 mg/dL — ABNORMAL LOW

## 2012-09-21 LAB — BASIC METABOLIC PANEL
Anion Gap: 7 (ref 7–16)
BUN: 10 mg/dL (ref 7–18)
Calcium, Total: 8.5 mg/dL (ref 8.5–10.1)
Chloride: 99 mmol/L (ref 98–107)
Co2: 29 mmol/L (ref 21–32)
Creatinine: 0.66 mg/dL (ref 0.60–1.30)
EGFR (African American): >60
EGFR (Non-African Amer.): >60
Glucose: 255 mg/dL — ABNORMAL HIGH (ref 65–99)
Osmolality: 278 (ref 275–301)
Potassium: 3.5 mmol/L (ref 3.5–5.1)
Sodium: 135 mmol/L — ABNORMAL LOW (ref 136–145)

## 2012-09-21 LAB — MAGNESIUM: Magnesium: 1.8 mg/dL

## 2012-09-22 LAB — BASIC METABOLIC PANEL
Anion Gap: 6 — ABNORMAL LOW (ref 7–16)
BUN: 9 mg/dL (ref 7–18)
Calcium, Total: 8.8 mg/dL (ref 8.5–10.1)
Chloride: 101 mmol/L (ref 98–107)
Co2: 30 mmol/L (ref 21–32)
Creatinine: 0.51 mg/dL — ABNORMAL LOW (ref 0.60–1.30)
EGFR (African American): >60
EGFR (Non-African Amer.): >60
Glucose: 171 mg/dL — ABNORMAL HIGH (ref 65–99)
Osmolality: 277 (ref 275–301)
Potassium: 3.6 mmol/L (ref 3.5–5.1)
Sodium: 137 mmol/L (ref 136–145)

## 2012-09-23 LAB — BASIC METABOLIC PANEL WITH GFR
Anion Gap: 4 — ABNORMAL LOW
BUN: 10 mg/dL
Calcium, Total: 8.8 mg/dL
Chloride: 100 mmol/L
Co2: 32 mmol/L
Creatinine: 0.68 mg/dL
EGFR (African American): >60
EGFR (Non-African Amer.): >60
Glucose: 150 mg/dL — ABNORMAL HIGH
Osmolality: 274
Potassium: 3.6 mmol/L
Sodium: 136 mmol/L

## 2012-09-24 ENCOUNTER — Other Ambulatory Visit: Payer: Self-pay | Admitting: Cardiovascular Disease

## 2012-09-24 LAB — BASIC METABOLIC PANEL
Anion Gap: 7 (ref 7–16)
BUN: 12 mg/dL (ref 7–18)
Calcium, Total: 9 mg/dL (ref 8.5–10.1)
Chloride: 100 mmol/L (ref 98–107)
Co2: 30 mmol/L (ref 21–32)
Creatinine: 0.61 mg/dL (ref 0.60–1.30)
EGFR (African American): >60
EGFR (Non-African Amer.): >60
Glucose: 154 mg/dL — ABNORMAL HIGH (ref 65–99)
Osmolality: 277 (ref 275–301)
Potassium: 3.6 mmol/L (ref 3.5–5.1)
Sodium: 137 mmol/L (ref 136–145)

## 2012-09-24 LAB — PLATELET COUNT: Platelet: 259 10*3/uL (ref 150–440)

## 2012-09-24 MED ORDER — POTASSIUM CHLORIDE CRYS ER 20 MEQ PO TBCR: 20.0000 meq | EXTENDED_RELEASE_TABLET | Freq: Two times a day (BID) | ORAL | Status: DC

## 2012-09-24 MED ORDER — TORSEMIDE 20 MG PO TABS: 60.0000 mg | ORAL_TABLET | Freq: Two times a day (BID) | ORAL | Status: DC

## 2012-09-26 ENCOUNTER — Encounter: Payer: BC Managed Care – PPO | Admitting: Cardiovascular Disease

## 2012-10-02 ENCOUNTER — Inpatient Hospital Stay: Payer: BC Managed Care – PPO | Admitting: Pulmonary Disease

## 2012-10-04 DIAGNOSIS — N3946 Mixed incontinence: Secondary | ICD-10-CM | POA: Insufficient documentation

## 2012-10-04 DIAGNOSIS — R350 Frequency of micturition: Secondary | ICD-10-CM | POA: Insufficient documentation

## 2012-10-04 DIAGNOSIS — N139 Obstructive and reflux uropathy, unspecified: Secondary | ICD-10-CM | POA: Insufficient documentation

## 2012-10-04 DIAGNOSIS — N3281 Overactive bladder: Secondary | ICD-10-CM | POA: Insufficient documentation

## 2012-10-10 DIAGNOSIS — R339 Retention of urine, unspecified: Secondary | ICD-10-CM | POA: Insufficient documentation

## 2012-10-13 ENCOUNTER — Inpatient Hospital Stay: Payer: Self-pay | Admitting: Internal Medicine

## 2012-10-13 LAB — URINALYSIS, COMPLETE
Bilirubin,UR: NEGATIVE
Blood: NEGATIVE
Glucose,UR: NEGATIVE mg/dL (ref 0–75)
Hyaline Cast: 3
Ketone: NEGATIVE
Nitrite: POSITIVE
Ph: 6 (ref 4.5–8.0)
Protein: NEGATIVE
RBC,UR: 3 /HPF (ref 0–5)
Specific Gravity: 1.01 (ref 1.003–1.030)
Squamous Epithelial: <1
WBC UR: 25 /HPF (ref 0–5)

## 2012-10-13 LAB — CBC
HCT: 34 % — ABNORMAL LOW (ref 35.0–47.0)
HGB: 11.4 g/dL — ABNORMAL LOW (ref 12.0–16.0)
MCH: 27.2 pg (ref 26.0–34.0)
MCHC: 33.5 g/dL (ref 32.0–36.0)
MCV: 81 fL (ref 80–100)
Platelet: 322 10*3/uL (ref 150–440)
RBC: 4.2 10*6/uL (ref 3.80–5.20)
RDW: 16.8 % — ABNORMAL HIGH (ref 11.5–14.5)
WBC: 8.4 10*3/uL (ref 3.6–11.0)

## 2012-10-13 LAB — COMPREHENSIVE METABOLIC PANEL
Albumin: 3 g/dL — ABNORMAL LOW (ref 3.4–5.0)
Alkaline Phosphatase: 96 U/L (ref 50–136)
Anion Gap: 7 (ref 7–16)
BUN: 15 mg/dL (ref 7–18)
Bilirubin,Total: 0.2 mg/dL (ref 0.2–1.0)
Calcium, Total: 9 mg/dL (ref 8.5–10.1)
Chloride: 100 mmol/L (ref 98–107)
Co2: 29 mmol/L (ref 21–32)
Creatinine: 0.68 mg/dL (ref 0.60–1.30)
EGFR (African American): >60
EGFR (Non-African Amer.): >60
Glucose: 145 mg/dL — ABNORMAL HIGH (ref 65–99)
Osmolality: 275 (ref 275–301)
Potassium: 3.3 mmol/L — ABNORMAL LOW (ref 3.5–5.1)
SGOT(AST): 27 U/L (ref 15–37)
SGPT (ALT): 36 U/L (ref 12–78)
Sodium: 136 mmol/L (ref 136–145)
Total Protein: 7.4 g/dL (ref 6.4–8.2)

## 2012-10-13 LAB — PRO B NATRIURETIC PEPTIDE: B-Type Natriuretic Peptide: 244 pg/mL — ABNORMAL HIGH (ref 0–125)

## 2012-10-13 LAB — TROPONIN I: Troponin-I: <0.02 ng/mL

## 2012-10-14 DIAGNOSIS — I5030 Unspecified diastolic (congestive) heart failure: Secondary | ICD-10-CM

## 2012-10-14 LAB — BASIC METABOLIC PANEL
Anion Gap: 4 — ABNORMAL LOW (ref 7–16)
BUN: 14 mg/dL (ref 7–18)
Calcium, Total: 8.6 mg/dL (ref 8.5–10.1)
Chloride: 100 mmol/L (ref 98–107)
Co2: 34 mmol/L — ABNORMAL HIGH (ref 21–32)
Creatinine: 0.81 mg/dL (ref 0.60–1.30)
EGFR (African American): >60
EGFR (Non-African Amer.): >60
Glucose: 223 mg/dL — ABNORMAL HIGH (ref 65–99)
Osmolality: 283 (ref 275–301)
Potassium: 3.3 mmol/L — ABNORMAL LOW (ref 3.5–5.1)
Sodium: 138 mmol/L (ref 136–145)

## 2012-10-14 LAB — CBC WITH DIFFERENTIAL/PLATELET
Basophil #: 0 10*3/uL (ref 0.0–0.1)
Basophil %: 0.5 %
Eosinophil #: 0 10*3/uL (ref 0.0–0.7)
Eosinophil %: 0.1 %
HCT: 32.6 % — ABNORMAL LOW (ref 35.0–47.0)
HGB: 10.9 g/dL — ABNORMAL LOW (ref 12.0–16.0)
Lymphocyte #: 1.3 10*3/uL (ref 1.0–3.6)
Lymphocyte %: 15.1 %
MCH: 27.1 pg (ref 26.0–34.0)
MCHC: 33.6 g/dL (ref 32.0–36.0)
MCV: 81 fL (ref 80–100)
Monocyte #: 0.5 x10 3/mm (ref 0.2–0.9)
Monocyte %: 5.6 %
Neutrophil #: 6.7 10*3/uL — ABNORMAL HIGH (ref 1.4–6.5)
Neutrophil %: 78.7 %
Platelet: 285 10*3/uL (ref 150–440)
RBC: 4.04 10*6/uL (ref 3.80–5.20)
RDW: 17.3 % — ABNORMAL HIGH (ref 11.5–14.5)
WBC: 8.5 10*3/uL (ref 3.6–11.0)

## 2012-10-14 LAB — HEMOGLOBIN A1C: Hemoglobin A1C: 9.8 % — ABNORMAL HIGH (ref 4.2–6.3)

## 2012-10-14 LAB — TROPONIN I
Troponin-I: <0.02 ng/mL
Troponin-I: <0.02 ng/mL

## 2012-10-14 LAB — CK TOTAL AND CKMB (NOT AT ARMC)
CK, Total: 43 U/L (ref 21–215)
CK, Total: 43 U/L (ref 21–215)
CK, Total: 40 U/L (ref 21–215)
CK-MB: 0.7 ng/mL (ref 0.5–3.6)
CK-MB: <0.5 ng/mL — ABNORMAL LOW (ref 0.5–3.6)
CK-MB: <0.5 ng/mL — ABNORMAL LOW (ref 0.5–3.6)

## 2012-10-14 LAB — TSH: Thyroid Stimulating Horm: 4.31 u[IU]/mL

## 2012-10-14 LAB — MAGNESIUM: Magnesium: 1.6 mg/dL — ABNORMAL LOW

## 2012-10-16 ENCOUNTER — Telehealth: Payer: Self-pay

## 2012-10-16 NOTE — Telephone Encounter (Signed)
Message copied by Marcelle Overlie on Mon Oct 16, 2012 10:47 AM ------      Message from: Coralee Rud      Created: Mon Oct 16, 2012 10:42 AM      Regarding: tcm/ph       10/27/12 8:45 dr Kirke Corin ------

## 2012-10-16 NOTE — Telephone Encounter (Signed)
tcm

## 2012-10-17 LAB — URINE CULTURE

## 2012-10-17 NOTE — Telephone Encounter (Signed)
Pt informed Understanding verb 

## 2012-10-17 NOTE — Telephone Encounter (Signed)
Hold Lasix for 1 day.

## 2012-10-17 NOTE — Telephone Encounter (Signed)
Patient contacted regarding discharge from [facility] on 10/15/12.  Patient understands to follow up with provider Dr. Kirke Corin on 10/27/12 at 0845 at [facility] office. Patient understands discharge instructions? yes Patient understands medications and regiment? yes Patient understands to bring all medications to this visit? yes  Pt says she is not feeling well today, weight is down 8 pounds overnight and she feels slightly nauseated Denies worsening sob or edema She does not have glucometer at home so we are unsure about blood sugar readings Confirms compliance with diuretic 60 mg BID I will make Dr. Kirke Corin aware and see if he wants to make any changes to this based on her weight loss overnight I will call her back

## 2012-10-27 ENCOUNTER — Encounter: Payer: Self-pay | Admitting: Cardiovascular Disease

## 2012-10-27 ENCOUNTER — Other Ambulatory Visit: Payer: Self-pay | Admitting: *Deleted

## 2012-10-27 ENCOUNTER — Ambulatory Visit (INDEPENDENT_AMBULATORY_CARE_PROVIDER_SITE_OTHER): Payer: BC Managed Care – PPO | Admitting: Cardiovascular Disease

## 2012-10-27 VITALS — BP 118/80 | HR 83 | Ht 67.0 in | Wt 363.5 lb

## 2012-10-27 DIAGNOSIS — R0602 Shortness of breath: Secondary | ICD-10-CM

## 2012-10-27 DIAGNOSIS — I5032 Chronic diastolic (congestive) heart failure: Secondary | ICD-10-CM

## 2012-10-27 MED ORDER — METOLAZONE 2.5 MG PO TABS: 2.5000 mg | ORAL_TABLET | ORAL | Status: DC

## 2012-10-27 NOTE — Assessment & Plan Note (Signed)
She has recurrent admissions for fluid overload resistant to oral diuretics. Her weight has been going up again in spite of taking torsemide 60 mg twice daily. I discussed with her the importance of low sodium diet. She has been monitoring her weight very carefully. I will add metolazone 2.5 mg to be taken once weekly as needed for fluid retention. I asked her to take an additional potassium tablet with this. I explained to her there is enhance diuresis with this with potential volume depletion. She will continue to monitor her weight very carefully. It has been very difficult to keep her out of the hospital. I will have her followup with me in one month.

## 2012-10-27 NOTE — Progress Notes (Signed)
HPI  This is a 52 year old female who is here today to establish cardiovascular care. We have seen her multiple times at [facility] for recurrent acute on chronic diastolic heart failure. She tends to be diuretic resistant. She has multiple chronic medical conditions that include morbid obesity with previous LAP-BAND surgery which was removed last year, obstructive sleep apnea, rheumatoid arthritis, hypothyroidism, anxiety and depression. She had multiple recurrent admissions to [facility] due to fluid overload in spite of taking oral diuretics at home. She usually responds well to IV diuretics. Most recent admission was on June 27. At the hospital discharge, she called [name] due to her weight loss of 8 pounds. We held her diuretics for a day. Here we continued to be stable but started going up again over the last few days..Her usual weight seems to be around 350 pounds. She is now up to 362. She complains of lower extremity edema. No significant worsening of dyspnea. No orthopnea or PND.  Allergies  Allergen Reactions  . Hydrocodone     Hear racing & breaks out into a cold sweat.  . Codeine Palpitations    "makes heart fly, she gets flushed and passes out"  . Sulfa Antibiotics Palpitations    "makes heart fly, she gets flushed and passes out"     Current Outpatient Prescriptions on File Prior to Visit  Medication Sig Dispense Refill  . ARIPiprazole (ABILIFY) 5 MG tablet Take 5 mg by mouth daily.      . busPIRone (BUSPAR) 10 MG tablet Take 10 mg by mouth 2 (two) times daily.      . clotrimazole-betamethasone (LOTRISONE) cream Apply 1 application topically 2 (two) times daily. For yeast infection under stomach      . FLUoxetine (PROZAC) 20 MG capsule Take 20 mg by mouth daily. Take four capsules by mouth daily (at one time).      . folic acid (FOLVITE) 1 MG tablet Take 1 mg by mouth daily. Take two tablets to make 2mg .      . gabapentin (NEURONTIN) 300 MG capsule Take 300 mg by mouth Three times a day.        . hydrOXYzine (ATARAX/VISTARIL) 50 MG tablet Take 50 mg by mouth Twice daily.       Marland Kitchen levothyroxine (SYNTHROID, LEVOTHROID) 88 MCG tablet Take 88 mcg by mouth daily before breakfast.      . metFORMIN (GLUCOPHAGE) 500 MG tablet Take 500 mg by mouth 2 (two) times daily with a meal.      . Methotrexate, PF, 25 MG/0.4ML SOAJ Inject into the skin once a week.      Marland Kitchen oxyCODONE-acetaminophen (PERCOCET) 5-325 MG per tablet Take 1 tablet by mouth every 6 (six) hours as needed. For pain      . potassium chloride SA (K-DUR,KLOR-CON) 20 MEQ tablet Take 1 tablet (20 mEq total) by mouth 2 (two) times daily.  60 tablet  6  . predniSONE (DELTASONE) 10 MG tablet Take 10 mg by mouth daily as needed.       . promethazine (PHENERGAN) 25 MG tablet Take 25 mg by mouth every 6 (six) hours as needed. For nausea      . traZODone (DESYREL) 100 MG tablet Takes 2 tablets daily.       No current facility-administered medications on file prior to visit.     Past Medical History  Diagnosis Date  . Anxiety   . Morbid obesity   . Depression   . Panic attacks   .  GERD (gastroesophageal reflux disease)   . Fluid retention   . IBS (irritable bowel syndrome)   . Esophagitis   . Hiatal hernia   . Neurogenic bladder     has pacemaker  . Arthritis     Rheumatoid  . Neuropathy   . Sleep apnea     STATES SEVERE, CANT TOLERATE MASK- LAST STUDY YEARS AGO  . Hypothyroidism   . Diabetes mellitus     states no meds or diet restrictions  at present  . Hypertension   . Obesity   . Rheumatoid arthritis   . Diastolic CHF   . Chronic diastolic heart failure      Past Surgical History  Procedure Laterality Date  . Tubal ligation    . Tonsillectomy    . Cholecystectomy    . Abdominal hysterectomy    . Laparoscopic gastric banding  03/20/07  . Eye surgery      bilateral cataract extraction with IOL  . Hernia repair      ventral hernia with strangulation     Family History  Problem Relation Age of Onset  .  Heart failure Father   . Heart disease Brother   . Heart attack Brother 49    MI s/p stents placed     History   Social History  . Marital Status: Divorced    Spouse Name: N/A    Number of Children: N/A  . Years of Education: N/A   Occupational History  . Not on file.   Social History Main Topics  . Smoking status: Former Smoker -- 2.00 packs/day for 27 years    Types: Cigarettes    Quit date: 07/30/1999  . Smokeless tobacco: Never Used  . Alcohol Use: No  . Drug Use: No  . Sexually Active: Not on file   Other Topics Concern  . Not on file   Social History Narrative  . No narrative on file     ROS A 10 point review of system was performed. It's negative other than what is mentioned in the history of present illness.  PHYSICAL EXAM   BP 118/80  Pulse 83  Ht 5\' 7"  (1.702 m)  Wt 363 lb 8 oz (164.883 kg)  BMI 56.92 kg/m2  LMP 04/20/2001 Constitutional: She is oriented to person, place, and time. She appears well-developed and well-nourished. No distress.  HENT: No nasal discharge.  Head: Normocephalic and atraumatic.  Eyes: Pupils are equal and round. Right eye exhibits no discharge. Left eye exhibits no discharge.  Neck: Normal range of motion. Neck supple. No JVD present. No thyromegaly present.  Cardiovascular: Normal rate, regular rhythm, normal heart sounds. Exam reveals no gallop and no friction rub. No murmur heard.  Pulmonary/Chest: Effort normal and breath sounds normal. No stridor. No respiratory distress. She has no wheezes. She has no rales. She exhibits no tenderness.  Abdominal: Soft. Bowel sounds are normal. She exhibits no distension. There is no tenderness. There is no rebound and no guarding.  Musculoskeletal: Normal range of motion. She exhibits +1 edema and no tenderness.  Neurological: She is alert and oriented to person, place, and time. Coordination normal.  Skin: Skin is warm and dry. Significant stasis dermatitis. She is not diaphoretic.  No erythema. No pallor.  Psychiatric: She has a normal mood and affect. Her behavior is normal. Judgment and thought content normal.     WUJ:WJXBJ  Rhythm  -Old inferior-apical infarct ?  ABNORMAL    ASSESSMENT AND PLAN

## 2012-10-27 NOTE — Patient Instructions (Addendum)
Start Metolazone 2.5 mg once weekly as needed for fluid retention and weight gain. Take an extra tablet of Potassium when you use this.  Continue other medications.  Follow up in 1 month.

## 2012-10-27 NOTE — Telephone Encounter (Signed)
Error

## 2012-11-02 DIAGNOSIS — R339 Retention of urine, unspecified: Secondary | ICD-10-CM | POA: Insufficient documentation

## 2012-11-07 ENCOUNTER — Telehealth: Payer: Self-pay

## 2012-11-07 DIAGNOSIS — E876 Hypokalemia: Secondary | ICD-10-CM

## 2012-11-07 NOTE — Telephone Encounter (Signed)
Nurse with [facility] called to let dr Kirke Corin know pt has gained over 16 lbs.in 1 month, states pt is not calling to report weight gain, also complaining of dry hacking cough. Please call with recommendations.

## 2012-11-14 NOTE — Telephone Encounter (Signed)
Reviewed with Dr. Mariah Milling. He recommends additional metolazone for the patient. With it being late in the day, she will take metolazone 2.5 mg in the morning and on Thursday morning 30 minutes prior to her first dose of torsemide. Dr. Mariah Milling wanted her to do this Friday morning as well, but she is hesitant to do this. I advised that she can take her dose on Wednesday and Thursday. She will weigh daily and call the office Friday morning with an update on her weight. Per Dr. Mariah Milling, the patient needs potassium 20 meq two tablets twice daily until Friday, then two tablets in the am and one tablet in the pm as a maintenance dose. She verbalizes understanding. She will call back Friday with her weights. She will should come for a BMP on Monday. Will forward to Dr. Kirke Corin as well.

## 2012-11-14 NOTE — Telephone Encounter (Signed)
I called and spoke with Larita Fife with [facility]. She reports she has seen the patient only one time. The patient does weigh herself at home and reports a 16 lb weight gain over the last month. She has a dry hacking cough and visible lower extremity edema. Larita Fife reports the patient also has a bladder pacemaker. The patient reported to Larita Fife that she saw her urologist last week and her pacemaker was adjusted. I explained to Larita Fife I would call the patient to further assess what his going on with her. Sherri Rad, RN, BSN  I called and spoke with the patient. She confirms that over the last month she has gained about 16 lbs. She has lower extremity swelling. She is sleeping in a recliner, but this has been intermittent over the last 2 years due to RA as well as her fluid retention. She reports that adjustments made to her bladder pacemaker have not helped her over the last week. She reports a 3 lb weight gain yesterday. Her weight yesterday was 363.4 lbs and today she is 366.2 lbs. On 7/23 she was 359 lbs. The patient saw Dr. Kirke Corin on 10/27/12 and her weight was 363.8 lbs in our office. Her baseline weight, per her report, is 330 lbs (she does not know when she last weighed this).  She states that she usually has to be sent to the hospital to be cathed, and her PCP usually sets this up for her. Her PCP felt at this point that evaluation by the cardiologist was warranted. I confirmed with the patient she is on torsemide 60 mg BID, metalozone 2.5 mg one Wednesday's, and potassium 20 meq BID. I advised the patient I will send the message to Dr. Kirke Corin, and also review with Dr. Mariah Milling in the office. I will call her back with recommendations. She is agreeable. Sherri Rad, RN, BSN

## 2012-11-17 NOTE — Telephone Encounter (Signed)
LMTCB

## 2012-11-21 NOTE — Telephone Encounter (Signed)
Called spoke with pt inquired about weight gain, edema and SOB. Pt states she weighed 370.8 yesterday and weighed 365.8 today.  Pt states breathing and edema is better overall.  Pt will continue to monitor and call back with problems.

## 2012-12-05 ENCOUNTER — Encounter: Payer: Self-pay | Admitting: Cardiovascular Disease

## 2012-12-05 ENCOUNTER — Ambulatory Visit (INDEPENDENT_AMBULATORY_CARE_PROVIDER_SITE_OTHER): Payer: BC Managed Care – PPO | Admitting: Cardiovascular Disease

## 2012-12-05 VITALS — BP 130/80 | HR 85 | Ht 66.0 in | Wt 367.2 lb

## 2012-12-05 DIAGNOSIS — I5032 Chronic diastolic (congestive) heart failure: Secondary | ICD-10-CM

## 2012-12-05 NOTE — Patient Instructions (Addendum)
Continue same medications.  Follow up in 3 months.  

## 2012-12-05 NOTE — Assessment & Plan Note (Signed)
She is considering surgical weight loss and seems to want to pursue this at [facility]. Previous lap band surgery was not effective.

## 2012-12-05 NOTE — Assessment & Plan Note (Signed)
She appears a mildly fluid overloaded but she is due to take metolazone tomorrow. She is also on torsemide 60 mg twice daily. I recommend continuing current diuretic regimen. I'm hesitant to increase his any further due to risk of volume depletion and electrolyte abnormalities. We might on occasion have to consider twice weekly metolazone.

## 2012-12-05 NOTE — Progress Notes (Signed)
HPI  This is a 52 year old female who is here today for followup visit regarding chronic diastolic heart failure. She tends to be diuretic resistant. She has multiple chronic medical conditions that include morbid obesity with previous LAP-BAND surgery which was removed last year, obstructive sleep apnea, rheumatoid arthritis, hypothyroidism, anxiety and depression. She also suffers from bladder dysfunction with difficulty voiding. She has a bladder pacemaker which was adjusted recently by Dr. Achilles Dunk. This makes diuresis more difficult and usually she requires Foley catheterization while hospitalized.  She had multiple recurrent admissions to [facility] due to fluid overload in spite of taking oral diuretics at home. She usually responds well to IV diuretics. Most recent admission was on June 27.  During last visit, I placed her on metolazone 2.5 mg to be taken once weekly which has been working reasonably well. Usually she uses about 6 pounds when she takes this. She takes it on Wednesdays. Her weight today is 367 pounds. Weight during last visit was 363. She is due to take metolazone tomorrow.  Allergies  Allergen Reactions  . Hydrocodone     Hear racing & breaks out into a cold sweat.  . Codeine Palpitations    "makes heart fly, she gets flushed and passes out"  . Sulfa Antibiotics Palpitations    "makes heart fly, she gets flushed and passes out"     Current Outpatient Prescriptions on File Prior to Visit  Medication Sig Dispense Refill  . ARIPiprazole (ABILIFY) 5 MG tablet Take 5 mg by mouth daily.      . busPIRone (BUSPAR) 10 MG tablet Take 10 mg by mouth 2 (two) times daily.      . clotrimazole-betamethasone (LOTRISONE) cream Apply 1 application topically 2 (two) times daily. For yeast infection under stomach      . FLUoxetine (PROZAC) 20 MG capsule Take 20 mg by mouth daily. Take four capsules by mouth daily (at one time).      . folic acid (FOLVITE) 1 MG tablet Take two tablets to make  2mg .      . gabapentin (NEURONTIN) 300 MG capsule Take 300 mg by mouth Three times a day.       . hydrOXYzine (ATARAX/VISTARIL) 50 MG tablet Take 50 mg by mouth Twice daily.       Marland Kitchen levothyroxine (SYNTHROID, LEVOTHROID) 88 MCG tablet Take 88 mcg by mouth daily before breakfast.      . metFORMIN (GLUCOPHAGE) 500 MG tablet Take 500 mg by mouth 2 (two) times daily with a meal.      . Methotrexate, PF, 25 MG/0.4ML SOAJ Inject into the skin once a week.      . metolazone (ZAROXOLYN) 2.5 MG tablet Take 1 tablet (2.5 mg total) by mouth once a week.  30 tablet  0  . oxyCODONE-acetaminophen (PERCOCET) 5-325 MG per tablet Take 1 tablet by mouth every 6 (six) hours as needed. For pain      . potassium chloride SA (K-DUR,KLOR-CON) 20 MEQ tablet Take two tablets in the AM and one tablet in the PM      . predniSONE (DELTASONE) 10 MG tablet Take 10 mg by mouth daily as needed.       . promethazine (PHENERGAN) 25 MG tablet Take 25 mg by mouth every 6 (six) hours as needed. For nausea      . tamsulosin (FLOMAX) 0.4 MG CAPS Take 0.4 mg by mouth daily.      Marland Kitchen torsemide (DEMADEX) 20 MG tablet Take 60 mg by  mouth 2 (two) times daily.      . traZODone (DESYREL) 100 MG tablet Takes 2 tablets daily.       No current facility-administered medications on file prior to visit.     Past Medical History  Diagnosis Date  . Anxiety   . Morbid obesity   . Depression   . Panic attacks   . GERD (gastroesophageal reflux disease)   . Fluid retention   . IBS (irritable bowel syndrome)   . Esophagitis   . Hiatal hernia   . Neurogenic bladder     has pacemaker  . Arthritis     Rheumatoid  . Neuropathy   . Sleep apnea     STATES SEVERE, CANT TOLERATE MASK- LAST STUDY YEARS AGO  . Hypothyroidism   . Diabetes mellitus     states no meds or diet restrictions  at present  . Hypertension   . Obesity   . Rheumatoid arthritis   . Diastolic CHF   . Chronic diastolic heart failure      Past Surgical History    Procedure Laterality Date  . Tubal ligation    . Tonsillectomy    . Cholecystectomy    . Abdominal hysterectomy    . Laparoscopic gastric banding  03/20/07  . Eye surgery      bilateral cataract extraction with IOL  . Hernia repair      ventral hernia with strangulation     Family History  Problem Relation Age of Onset  . Heart failure Father   . Heart disease Brother   . Heart attack Brother 68    MI s/p stents placed     History   Social History  . Marital Status: Divorced    Spouse Name: N/A    Number of Children: N/A  . Years of Education: N/A   Occupational History  . Not on file.   Social History Main Topics  . Smoking status: Former Smoker -- 2.00 packs/day for 27 years    Types: Cigarettes    Quit date: 07/30/1999  . Smokeless tobacco: Never Used  . Alcohol Use: No  . Drug Use: No  . Sexual Activity: Not on file   Other Topics Concern  . Not on file   Social History Narrative  . No narrative on file     ROS A 10 point review of system was performed. It's negative other than what is mentioned in the history of present illness.  PHYSICAL EXAM   BP 130/80  Pulse 85  Ht 5\' 6"  (1.676 m)  Wt 367 lb 4 oz (166.584 kg)  BMI 59.3 kg/m2  LMP 04/20/2001 Constitutional: She is oriented to person, place, and time. She appears well-developed and well-nourished. No distress.  HENT: No nasal discharge.  Head: Normocephalic and atraumatic.  Eyes: Pupils are equal and round. Right eye exhibits no discharge. Left eye exhibits no discharge.  Neck: Normal range of motion. Neck supple. No JVD present. No thyromegaly present.  Cardiovascular: Normal rate, regular rhythm, normal heart sounds. Exam reveals no gallop and no friction rub. No murmur heard.  Pulmonary/Chest: Effort normal and breath sounds normal. No stridor. No respiratory distress. She has no wheezes. She has no rales. She exhibits no tenderness.  Abdominal: Soft. Bowel sounds are normal. She  exhibits no distension. There is no tenderness. There is no rebound and no guarding.  Musculoskeletal: Normal range of motion. She exhibits +1 edema and no tenderness.  Neurological: She is alert and oriented to  person, place, and time. Coordination normal.  Skin: Skin is warm and dry. Significant stasis dermatitis. She is not diaphoretic. No erythema. No pallor.  Psychiatric: She has a normal mood and affect. Her behavior is normal. Judgment and thought content normal.     WUJ:WJXBJ  Rhythm  WITHIN NORMAL LIMITS    ASSESSMENT AND PLAN

## 2012-12-15 ENCOUNTER — Ambulatory Visit: Payer: Self-pay | Admitting: Emergency Medicine

## 2012-12-15 LAB — URINALYSIS, COMPLETE
Bilirubin,UR: NEGATIVE
Glucose,UR: NEGATIVE mg/dL (ref 0–75)
Ketone: NEGATIVE
Nitrite: POSITIVE
Ph: 6 (ref 4.5–8.0)
Specific Gravity: 1.02 (ref 1.003–1.030)

## 2012-12-17 LAB — URINE CULTURE

## 2013-01-29 DIAGNOSIS — K439 Ventral hernia without obstruction or gangrene: Secondary | ICD-10-CM

## 2013-01-29 HISTORY — DX: Ventral hernia without obstruction or gangrene: K43.9

## 2013-02-21 ENCOUNTER — Inpatient Hospital Stay: Payer: Self-pay | Admitting: Internal Medicine

## 2013-02-21 ENCOUNTER — Telehealth: Payer: Self-pay | Admitting: *Deleted

## 2013-02-21 LAB — CBC
HCT: 34.8 % — ABNORMAL LOW (ref 35.0–47.0)
HGB: 11.5 g/dL — ABNORMAL LOW (ref 12.0–16.0)
MCH: 25.6 pg — ABNORMAL LOW (ref 26.0–34.0)
MCHC: 33 g/dL (ref 32.0–36.0)
MCV: 78 fL — ABNORMAL LOW (ref 80–100)
Platelet: 273 10*3/uL (ref 150–440)
RBC: 4.48 10*6/uL (ref 3.80–5.20)
RDW: 17.5 % — ABNORMAL HIGH (ref 11.5–14.5)
WBC: 8.3 10*3/uL (ref 3.6–11.0)

## 2013-02-21 LAB — BASIC METABOLIC PANEL
Anion Gap: 3 — ABNORMAL LOW (ref 7–16)
BUN: 11 mg/dL (ref 7–18)
Calcium, Total: 9 mg/dL (ref 8.5–10.1)
Chloride: 102 mmol/L (ref 98–107)
Co2: 29 mmol/L (ref 21–32)
Creatinine: 0.66 mg/dL (ref 0.60–1.30)
EGFR (African American): >60
EGFR (Non-African Amer.): >60
Glucose: 145 mg/dL — ABNORMAL HIGH (ref 65–99)
Osmolality: 270 (ref 275–301)
Potassium: 3.6 mmol/L (ref 3.5–5.1)
Sodium: 134 mmol/L — ABNORMAL LOW (ref 136–145)

## 2013-02-21 LAB — TROPONIN I: Troponin-I: <0.02 ng/mL

## 2013-02-21 LAB — PRO B NATRIURETIC PEPTIDE: B-Type Natriuretic Peptide: 70 pg/mL (ref 0–125)

## 2013-02-21 NOTE — Telephone Encounter (Signed)
Patient having trouble with swelling. Legs and feet are the worst with blister. Having sob and chest pains. Please advise

## 2013-02-21 NOTE — Telephone Encounter (Signed)
Spoke w/ pt.  She reports increased swelling.   Wt 362 up to 372, 10 lb wt gain over 3 days.  Increased SOB, difficulty walking, took extra torsemide, but "it didn't help".  Reports last night she had a sharp pain that radiated to her shoulder blade. She did not call anyone b/c she "figured it would go away." Reports lower legs are swollen with blisters and feel hot.  Instructed pt to go to nearest ED immediately. She states that she has a ride and will go now.  Asked that I call [facility] ED to let them know she is on her way. Spoke w/ Amber in ED, who reports that pt presents often with same symptoms and they will anticipate pt's arrival.

## 2013-02-22 ENCOUNTER — Other Ambulatory Visit: Payer: Self-pay

## 2013-02-22 DIAGNOSIS — E662 Morbid (severe) obesity with alveolar hypoventilation: Secondary | ICD-10-CM

## 2013-02-22 DIAGNOSIS — I5033 Acute on chronic diastolic (congestive) heart failure: Secondary | ICD-10-CM

## 2013-02-22 LAB — COMPREHENSIVE METABOLIC PANEL
Albumin: 2.8 g/dL — ABNORMAL LOW (ref 3.4–5.0)
Alkaline Phosphatase: 93 U/L (ref 50–136)
Anion Gap: 5 — ABNORMAL LOW (ref 7–16)
BUN: 10 mg/dL (ref 7–18)
Bilirubin,Total: 0.4 mg/dL (ref 0.2–1.0)
Calcium, Total: 8.7 mg/dL (ref 8.5–10.1)
Chloride: 101 mmol/L (ref 98–107)
Co2: 28 mmol/L (ref 21–32)
Creatinine: 0.69 mg/dL (ref 0.60–1.30)
EGFR (African American): >60
EGFR (Non-African Amer.): >60
Glucose: 195 mg/dL — ABNORMAL HIGH (ref 65–99)
Osmolality: 273 (ref 275–301)
Potassium: 3.5 mmol/L (ref 3.5–5.1)
SGOT(AST): 44 U/L — ABNORMAL HIGH (ref 15–37)
SGPT (ALT): 46 U/L (ref 12–78)
Sodium: 134 mmol/L — ABNORMAL LOW (ref 136–145)
Total Protein: 7.2 g/dL (ref 6.4–8.2)

## 2013-02-22 LAB — CBC WITH DIFFERENTIAL/PLATELET
Basophil #: 0 10*3/uL (ref 0.0–0.1)
Basophil %: 0.6 %
Eosinophil #: 0 10*3/uL (ref 0.0–0.7)
Eosinophil %: 0.1 %
HCT: 31.5 % — ABNORMAL LOW (ref 35.0–47.0)
HGB: 10.4 g/dL — ABNORMAL LOW (ref 12.0–16.0)
Lymphocyte #: 1.2 10*3/uL (ref 1.0–3.6)
Lymphocyte %: 15.2 %
MCH: 25.3 pg — ABNORMAL LOW (ref 26.0–34.0)
MCHC: 33.2 g/dL (ref 32.0–36.0)
MCV: 76 fL — ABNORMAL LOW (ref 80–100)
Monocyte #: 0.4 x10 3/mm (ref 0.2–0.9)
Monocyte %: 5.1 %
Neutrophil #: 6.1 10*3/uL (ref 1.4–6.5)
Neutrophil %: 79 %
Platelet: 259 10*3/uL (ref 150–440)
RBC: 4.12 10*6/uL (ref 3.80–5.20)
RDW: 17.2 % — ABNORMAL HIGH (ref 11.5–14.5)
WBC: 7.7 10*3/uL (ref 3.6–11.0)

## 2013-02-22 LAB — CK TOTAL AND CKMB (NOT AT ARMC)
CK, Total: 46 U/L (ref 21–215)
CK, Total: 44 U/L (ref 21–215)
CK-MB: <0.5 ng/mL — ABNORMAL LOW (ref 0.5–3.6)
CK-MB: <0.5 ng/mL — ABNORMAL LOW (ref 0.5–3.6)

## 2013-02-22 LAB — TROPONIN I
Troponin-I: <0.02 ng/mL
Troponin-I: <0.02 ng/mL

## 2013-02-22 LAB — MAGNESIUM: Magnesium: 1.5 mg/dL — ABNORMAL LOW

## 2013-02-23 LAB — COMPREHENSIVE METABOLIC PANEL
Albumin: 3.6 g/dL (ref 3.4–5.0)
Alkaline Phosphatase: 126 U/L (ref 50–136)
Anion Gap: 8 (ref 7–16)
BUN: 12 mg/dL (ref 7–18)
Bilirubin,Total: 0.8 mg/dL (ref 0.2–1.0)
Calcium, Total: 9.8 mg/dL (ref 8.5–10.1)
Chloride: 90 mmol/L — ABNORMAL LOW (ref 98–107)
Co2: 31 mmol/L (ref 21–32)
Creatinine: 0.9 mg/dL (ref 0.60–1.30)
EGFR (African American): >60
EGFR (Non-African Amer.): >60
Glucose: 253 mg/dL — ABNORMAL HIGH (ref 65–99)
Osmolality: 267 (ref 275–301)
Potassium: 3.7 mmol/L (ref 3.5–5.1)
SGOT(AST): 49 U/L — ABNORMAL HIGH (ref 15–37)
SGPT (ALT): 60 U/L (ref 12–78)
Sodium: 129 mmol/L — ABNORMAL LOW (ref 136–145)
Total Protein: 9.2 g/dL — ABNORMAL HIGH (ref 6.4–8.2)

## 2013-02-23 LAB — MAGNESIUM: Magnesium: 1.4 mg/dL — ABNORMAL LOW

## 2013-02-24 LAB — SODIUM: Sodium: 131 mmol/L — ABNORMAL LOW (ref 136–145)

## 2013-02-24 LAB — HEMOGLOBIN: HGB: 12.2 g/dL (ref 12.0–16.0)

## 2013-02-25 LAB — BASIC METABOLIC PANEL
Anion Gap: 2 — ABNORMAL LOW (ref 7–16)
BUN: 15 mg/dL (ref 7–18)
Calcium, Total: 9.3 mg/dL (ref 8.5–10.1)
Chloride: 89 mmol/L — ABNORMAL LOW (ref 98–107)
Co2: 38 mmol/L — ABNORMAL HIGH (ref 21–32)
Creatinine: 0.88 mg/dL (ref 0.60–1.30)
EGFR (African American): >60
EGFR (Non-African Amer.): >60
Glucose: 226 mg/dL — ABNORMAL HIGH (ref 65–99)
Osmolality: 267 (ref 275–301)
Potassium: 3.1 mmol/L — ABNORMAL LOW (ref 3.5–5.1)
Sodium: 129 mmol/L — ABNORMAL LOW (ref 136–145)

## 2013-02-25 LAB — PLATELET COUNT: Platelet: 290 10*3/uL (ref 150–440)

## 2013-03-08 ENCOUNTER — Ambulatory Visit: Payer: BC Managed Care – PPO | Admitting: Cardiovascular Disease

## 2013-03-09 ENCOUNTER — Encounter: Payer: Self-pay | Admitting: Cardiovascular Disease

## 2013-03-09 ENCOUNTER — Ambulatory Visit (INDEPENDENT_AMBULATORY_CARE_PROVIDER_SITE_OTHER): Payer: BC Managed Care – PPO | Admitting: Cardiovascular Disease

## 2013-03-09 VITALS — BP 140/90 | HR 66 | Ht 66.0 in | Wt 372.5 lb

## 2013-03-09 DIAGNOSIS — R0602 Shortness of breath: Secondary | ICD-10-CM

## 2013-03-09 DIAGNOSIS — I5032 Chronic diastolic (congestive) heart failure: Secondary | ICD-10-CM

## 2013-03-09 MED ORDER — METOLAZONE 2.5 MG PO TABS: 2.5000 mg | ORAL_TABLET | ORAL | Status: DC

## 2013-03-09 NOTE — Assessment & Plan Note (Addendum)
This is also contributing to her presentation I believe. She is still considering another weight loss surgery.

## 2013-03-09 NOTE — Patient Instructions (Signed)
Increase Metolazone to 2.5 mg twice weekly.  Continue other medications.   Follow up in 2 months.

## 2013-03-09 NOTE — Assessment & Plan Note (Signed)
I think it's going to be extremely difficult to keep her out of of hospitals as long as the issue of bladder dysfunction is not treated. She has difficulty voiding and is unable to self catheterize. This makes home diuresis very difficult. He gained significant amount of weight since hospital discharge. She has having more urologic studies next month. Recent echocardiogram actually showed normal LV systolic and diastolic function. Thus, I suspect that the current fluid overload might be related to noncardiac issues. For now, I will increase metolazone to 2.5 mg twice a week and continue treatment with torsemide.

## 2013-03-09 NOTE — Progress Notes (Signed)
Primary care physician: Dr. Mayford Knife  HPI  This is a 52 year old female who is here today for followup visit regarding chronic diastolic heart failure. She tends to be diuretic resistant. She has multiple chronic medical conditions that include morbid obesity with previous LAP-BAND surgery which was removed last year, obstructive sleep apnea, rheumatoid arthritis, morbid obesity, hypothyroidism, anxiety and depression. She also suffers from bladder dysfunction with difficulty voiding. She has a bladder pacemaker which has not been working well. This makes diuresis more difficult and usually she requires Foley catheterization while hospitalized.  She had multiple recurrent admissions to [facility] due to fluid overload in spite of taking oral diuretics at home. She usually responds well to IV diuretics.  She had a recent hospitalization at [facility] and responded to IV diuretics. Since hospital discharge, she again about 15 pounds with significant lower extremity edema and increased dyspnea. She had an echocardiogram during her hospitalization which showed normal LV systolic and diastolic function with no significant valvular abnormalities. She tends to avoid taking diuretics if she needs to go outside the house due to frequent urination. Usually she is only able to empty small amount.   Allergies  Allergen Reactions  . Hydrocodone     Hear racing & breaks out into a cold sweat.  . Codeine Palpitations    "makes heart fly, she gets flushed and passes out"  . Sulfa Antibiotics Palpitations    "makes heart fly, she gets flushed and passes out"     Current Outpatient Prescriptions on File Prior to Visit  Medication Sig Dispense Refill  . ARIPiprazole (ABILIFY) 5 MG tablet Take 5 mg by mouth daily.      . busPIRone (BUSPAR) 10 MG tablet Take 10 mg by mouth 2 (two) times daily.      . clotrimazole-betamethasone (LOTRISONE) cream Apply 1 application topically 2 (two) times daily. For yeast infection under  stomach      . FLUoxetine (PROZAC) 20 MG capsule Take 20 mg by mouth daily. Take four capsules by mouth daily (at one time).      . folic acid (FOLVITE) 1 MG tablet Take two tablets to make 2mg .      . gabapentin (NEURONTIN) 300 MG capsule Take 300 mg by mouth Three times a day.       . hydrOXYzine (ATARAX/VISTARIL) 50 MG tablet Take 50 mg by mouth Twice daily.       Marland Kitchen levothyroxine (SYNTHROID, LEVOTHROID) 88 MCG tablet Take 88 mcg by mouth daily before breakfast.      . metFORMIN (GLUCOPHAGE) 500 MG tablet Take 500 mg by mouth 2 (two) times daily with a meal.      . Methotrexate, PF, 25 MG/0.4ML SOAJ Inject into the skin once a week.      . metolazone (ZAROXOLYN) 2.5 MG tablet Take 1 tablet (2.5 mg total) by mouth once a week.  30 tablet  0  . oxyCODONE-acetaminophen (PERCOCET) 5-325 MG per tablet Take 1 tablet by mouth every 6 (six) hours as needed. For pain      . potassium chloride SA (K-DUR,KLOR-CON) 20 MEQ tablet Take two tablets in the AM and one tablet in the PM      . predniSONE (DELTASONE) 10 MG tablet Take 10 mg by mouth daily as needed.       . promethazine (PHENERGAN) 25 MG tablet Take 25 mg by mouth every 6 (six) hours as needed. For nausea      . tamsulosin (FLOMAX) 0.4 MG CAPS Take  0.4 mg by mouth daily.      Marland Kitchen torsemide (DEMADEX) 20 MG tablet Take 60 mg by mouth 2 (two) times daily.      . traZODone (DESYREL) 100 MG tablet Takes 2 tablets daily.       No current facility-administered medications on file prior to visit.     Past Medical History  Diagnosis Date  . Anxiety   . Morbid obesity   . Depression   . Panic attacks   . GERD (gastroesophageal reflux disease)   . Fluid retention   . IBS (irritable bowel syndrome)   . Esophagitis   . Hiatal hernia   . Neurogenic bladder     has pacemaker  . Arthritis     Rheumatoid  . Neuropathy   . Sleep apnea     STATES SEVERE, CANT TOLERATE MASK- LAST STUDY YEARS AGO  . Hypothyroidism   . Diabetes mellitus     states no  meds or diet restrictions  at present  . Hypertension   . Obesity   . Rheumatoid arthritis   . Diastolic CHF   . Chronic diastolic heart failure      Past Surgical History  Procedure Laterality Date  . Tubal ligation    . Tonsillectomy    . Cholecystectomy    . Abdominal hysterectomy    . Laparoscopic gastric banding  03/20/07  . Eye surgery      bilateral cataract extraction with IOL  . Hernia repair      ventral hernia with strangulation     Family History  Problem Relation Age of Onset  . Heart failure Father   . Heart disease Brother   . Heart attack Brother 67    MI s/p stents placed     History   Social History  . Marital Status: Divorced    Spouse Name: N/A    Number of Children: N/A  . Years of Education: N/A   Occupational History  . Not on file.   Social History Main Topics  . Smoking status: Former Smoker -- 2.00 packs/day for 27 years    Types: Cigarettes    Quit date: 07/30/1999  . Smokeless tobacco: Never Used  . Alcohol Use: No  . Drug Use: No  . Sexual Activity: Not on file   Other Topics Concern  . Not on file   Social History Narrative  . No narrative on file     ROS A 10 point review of system was performed. It's negative other than what is mentioned in the history of present illness.  PHYSICAL EXAM   BP 140/90  Pulse 66  Ht 5\' 6"  (1.676 m)  Wt 372 lb 8 oz (168.965 kg)  BMI 60.15 kg/m2  LMP 04/20/2001 Constitutional: She is oriented to person, place, and time. She appears well-developed and well-nourished. No distress.  HENT: No nasal discharge.  Head: Normocephalic and atraumatic.  Eyes: Pupils are equal and round. Right eye exhibits no discharge. Left eye exhibits no discharge.  Neck: Normal range of motion. Neck supple. No JVD present. No thyromegaly present.  Cardiovascular: Normal rate, regular rhythm, normal heart sounds. Exam reveals no gallop and no friction rub. No murmur heard.  Pulmonary/Chest: Effort normal  and breath sounds normal. No stridor. No respiratory distress. She has no wheezes. She has no rales. She exhibits no tenderness.  Abdominal: Soft. Bowel sounds are normal. She exhibits no distension. There is no tenderness. There is no rebound and no guarding.  Musculoskeletal:  Normal range of motion. She exhibits +2 edema and no tenderness.  Neurological: She is alert and oriented to person, place, and time. Coordination normal.  Skin: Skin is warm and dry. Significant stasis dermatitis. She is not diaphoretic. No erythema. No pallor.  Psychiatric: She has a normal mood and affect. Her behavior is normal. Judgment and thought content normal.     ZOX:WRUEA  Rhythm  WITHIN NORMAL LIMITS    ASSESSMENT AND PLAN

## 2013-03-18 ENCOUNTER — Ambulatory Visit: Payer: Self-pay | Admitting: Physician Assistant

## 2013-03-18 LAB — URINALYSIS, COMPLETE
Bilirubin,UR: NEGATIVE
Glucose,UR: 1000 mg/dL (ref 0–75)
Ketone: NEGATIVE
Nitrite: POSITIVE
Ph: 6 (ref 4.5–8.0)
Specific Gravity: 1.02 (ref 1.003–1.030)

## 2013-03-20 LAB — URINE CULTURE

## 2013-04-07 ENCOUNTER — Ambulatory Visit: Payer: Self-pay | Admitting: Physician Assistant

## 2013-04-07 LAB — URINALYSIS, COMPLETE
Bilirubin,UR: NEGATIVE
Blood: NEGATIVE
Ketone: NEGATIVE
Nitrite: NEGATIVE
Ph: 6.5 (ref 4.5–8.0)
Protein: NEGATIVE
Specific Gravity: 1.01 (ref 1.003–1.030)

## 2013-04-10 ENCOUNTER — Other Ambulatory Visit: Payer: Self-pay | Admitting: Cardiovascular Disease

## 2013-04-10 ENCOUNTER — Other Ambulatory Visit: Payer: Self-pay | Admitting: *Deleted

## 2013-04-10 DIAGNOSIS — E876 Hypokalemia: Secondary | ICD-10-CM

## 2013-04-10 LAB — URINE CULTURE

## 2013-04-10 MED ORDER — POTASSIUM CHLORIDE CRYS ER 20 MEQ PO TBCR: EXTENDED_RELEASE_TABLET | ORAL | Status: DC

## 2013-04-10 NOTE — Telephone Encounter (Signed)
Requested Prescriptions   Signed Prescriptions Disp Refills  . potassium chloride SA (K-DUR,KLOR-CON) 20 MEQ tablet 90 tablet 3    Sig: Take two tablets in the AM and one tablet in the PM    Authorizing Provider: Lorine Bears A    Ordering User: Kendrick Fries

## 2013-04-15 ENCOUNTER — Ambulatory Visit: Payer: Self-pay | Admitting: Medical

## 2013-04-15 LAB — URINALYSIS, COMPLETE
Bilirubin,UR: NEGATIVE
Ketone: NEGATIVE
Nitrite: POSITIVE
Ph: 6 (ref 4.5–8.0)
Protein: 30
Specific Gravity: 1.02 (ref 1.003–1.030)
WBC UR: >30 /HPF (ref 0–5)

## 2013-04-17 LAB — URINE CULTURE

## 2013-05-11 ENCOUNTER — Ambulatory Visit: Payer: BC Managed Care – PPO | Admitting: Cardiovascular Disease

## 2013-05-11 ENCOUNTER — Encounter: Payer: Self-pay | Admitting: *Deleted

## 2013-06-25 ENCOUNTER — Inpatient Hospital Stay: Payer: Self-pay | Admitting: Internal Medicine

## 2013-06-25 LAB — URINALYSIS, COMPLETE
Bilirubin,UR: NEGATIVE
Blood: NEGATIVE
Glucose,UR: >=500 mg/dL (ref 0–75)
Ketone: NEGATIVE
Nitrite: NEGATIVE
Ph: 5 (ref 4.5–8.0)
Protein: NEGATIVE
RBC,UR: 4 /HPF (ref 0–5)
Specific Gravity: 1.026 (ref 1.003–1.030)
Squamous Epithelial: 3
WBC UR: 49 /HPF (ref 0–5)

## 2013-06-25 LAB — CBC WITH DIFFERENTIAL/PLATELET
Basophil #: 0.1 10*3/uL (ref 0.0–0.1)
Basophil %: 0.9 %
Eosinophil #: 0 10*3/uL (ref 0.0–0.7)
Eosinophil %: 0.1 %
HCT: 37.3 % (ref 35.0–47.0)
HGB: 12.6 g/dL (ref 12.0–16.0)
Lymphocyte #: 1.2 10*3/uL (ref 1.0–3.6)
Lymphocyte %: 17.1 %
MCH: 25.9 pg — ABNORMAL LOW (ref 26.0–34.0)
MCHC: 33.7 g/dL (ref 32.0–36.0)
MCV: 77 fL — ABNORMAL LOW (ref 80–100)
Monocyte #: 0.3 x10 3/mm (ref 0.2–0.9)
Monocyte %: 5 %
Neutrophil #: 5.3 10*3/uL (ref 1.4–6.5)
Neutrophil %: 76.9 %
Platelet: 262 10*3/uL (ref 150–440)
RBC: 4.86 10*6/uL (ref 3.80–5.20)
RDW: 18.3 % — ABNORMAL HIGH (ref 11.5–14.5)
WBC: 6.8 10*3/uL (ref 3.6–11.0)

## 2013-06-25 LAB — BASIC METABOLIC PANEL
Anion Gap: 5 — ABNORMAL LOW (ref 7–16)
BUN: 11 mg/dL (ref 7–18)
Calcium, Total: 9.2 mg/dL (ref 8.5–10.1)
Chloride: 97 mmol/L — ABNORMAL LOW (ref 98–107)
Co2: 28 mmol/L (ref 21–32)
Creatinine: 0.69 mg/dL (ref 0.60–1.30)
EGFR (African American): >60
EGFR (Non-African Amer.): >60
Glucose: 371 mg/dL — ABNORMAL HIGH (ref 65–99)
Osmolality: 275 (ref 275–301)
Potassium: 3.9 mmol/L (ref 3.5–5.1)
Sodium: 130 mmol/L — ABNORMAL LOW (ref 136–145)

## 2013-06-25 LAB — SEDIMENTATION RATE: Erythrocyte Sed Rate: 57 mm/hr — ABNORMAL HIGH (ref 0–30)

## 2013-06-25 LAB — PRO B NATRIURETIC PEPTIDE: B-Type Natriuretic Peptide: 46 pg/mL (ref 0–125)

## 2013-06-25 LAB — TROPONIN I: Troponin-I: <0.02 ng/mL

## 2013-06-26 LAB — HEMOGLOBIN A1C: Hemoglobin A1C: 11.3 % — ABNORMAL HIGH (ref 4.2–6.3)

## 2013-06-26 LAB — BASIC METABOLIC PANEL
Anion Gap: 7 (ref 7–16)
BUN: 12 mg/dL (ref 7–18)
Calcium, Total: 9.3 mg/dL (ref 8.5–10.1)
Chloride: 95 mmol/L — ABNORMAL LOW (ref 98–107)
Co2: 27 mmol/L (ref 21–32)
Creatinine: 0.71 mg/dL (ref 0.60–1.30)
EGFR (African American): >60
EGFR (Non-African Amer.): >60
Glucose: 336 mg/dL — ABNORMAL HIGH (ref 65–99)
Osmolality: 272 (ref 275–301)
Potassium: 3.9 mmol/L (ref 3.5–5.1)
Sodium: 129 mmol/L — ABNORMAL LOW (ref 136–145)

## 2013-06-26 LAB — LIPID PANEL
Cholesterol: 188 mg/dL (ref 0–200)
HDL Cholesterol: 35 mg/dL — ABNORMAL LOW (ref 40–60)
Ldl Cholesterol, Calc: 127 mg/dL — ABNORMAL HIGH (ref 0–100)
Triglycerides: 130 mg/dL (ref 0–200)
VLDL Cholesterol, Calc: 26 mg/dL (ref 5–40)

## 2013-06-26 LAB — CBC WITH DIFFERENTIAL/PLATELET
Basophil #: 0 10*3/uL (ref 0.0–0.1)
Basophil %: 0.2 %
Eosinophil #: 0 10*3/uL (ref 0.0–0.7)
Eosinophil %: 0 %
HCT: 36.3 % (ref 35.0–47.0)
HGB: 12 g/dL (ref 12.0–16.0)
Lymphocyte #: 0.6 10*3/uL — ABNORMAL LOW (ref 1.0–3.6)
Lymphocyte %: 6.4 %
MCH: 25.6 pg — ABNORMAL LOW (ref 26.0–34.0)
MCHC: 33 g/dL (ref 32.0–36.0)
MCV: 78 fL — ABNORMAL LOW (ref 80–100)
Monocyte #: 0.2 x10 3/mm (ref 0.2–0.9)
Monocyte %: 1.9 %
Neutrophil #: 8.8 10*3/uL — ABNORMAL HIGH (ref 1.4–6.5)
Neutrophil %: 91.5 %
Platelet: 254 10*3/uL (ref 150–440)
RBC: 4.67 10*6/uL (ref 3.80–5.20)
RDW: 18.1 % — ABNORMAL HIGH (ref 11.5–14.5)
WBC: 9.6 10*3/uL (ref 3.6–11.0)

## 2013-06-26 LAB — TSH: Thyroid Stimulating Horm: 3.12 u[IU]/mL

## 2013-06-27 LAB — BASIC METABOLIC PANEL
Anion Gap: 7 (ref 7–16)
BUN: 16 mg/dL (ref 7–18)
Calcium, Total: 9.8 mg/dL (ref 8.5–10.1)
Chloride: 99 mmol/L (ref 98–107)
Co2: 28 mmol/L (ref 21–32)
Creatinine: 0.9 mg/dL (ref 0.60–1.30)
EGFR (African American): >60
EGFR (Non-African Amer.): >60
Glucose: 423 mg/dL — ABNORMAL HIGH (ref 65–99)
Osmolality: 287 (ref 275–301)
Potassium: 3.8 mmol/L (ref 3.5–5.1)
Sodium: 134 mmol/L — ABNORMAL LOW (ref 136–145)

## 2013-06-28 LAB — BASIC METABOLIC PANEL
Anion Gap: 8 (ref 7–16)
BUN: 28 mg/dL — ABNORMAL HIGH (ref 7–18)
Calcium, Total: 9.1 mg/dL (ref 8.5–10.1)
Chloride: 96 mmol/L — ABNORMAL LOW (ref 98–107)
Co2: 29 mmol/L (ref 21–32)
Creatinine: 1 mg/dL (ref 0.60–1.30)
EGFR (African American): >60
EGFR (Non-African Amer.): >60
Glucose: 246 mg/dL — ABNORMAL HIGH (ref 65–99)
Osmolality: 280 (ref 275–301)
Potassium: 3.7 mmol/L (ref 3.5–5.1)
Sodium: 133 mmol/L — ABNORMAL LOW (ref 136–145)

## 2013-06-29 LAB — URINE CULTURE

## 2013-06-30 LAB — BASIC METABOLIC PANEL
Anion Gap: 5 — ABNORMAL LOW (ref 7–16)
BUN: 24 mg/dL — ABNORMAL HIGH (ref 7–18)
Calcium, Total: 9.2 mg/dL (ref 8.5–10.1)
Chloride: 94 mmol/L — ABNORMAL LOW (ref 98–107)
Co2: 32 mmol/L (ref 21–32)
Creatinine: 0.82 mg/dL (ref 0.60–1.30)
EGFR (African American): >60
EGFR (Non-African Amer.): >60
Glucose: 298 mg/dL — ABNORMAL HIGH (ref 65–99)
Osmolality: 278 (ref 275–301)
Potassium: 4.1 mmol/L (ref 3.5–5.1)
Sodium: 131 mmol/L — ABNORMAL LOW (ref 136–145)

## 2013-06-30 LAB — CLOSTRIDIUM DIFFICILE(ARMC)

## 2013-07-01 LAB — WBCS, STOOL

## 2013-07-01 LAB — BASIC METABOLIC PANEL
Anion Gap: 3 — ABNORMAL LOW (ref 7–16)
BUN: 28 mg/dL — ABNORMAL HIGH (ref 7–18)
Calcium, Total: 9.5 mg/dL (ref 8.5–10.1)
Chloride: 91 mmol/L — ABNORMAL LOW (ref 98–107)
Co2: 39 mmol/L — ABNORMAL HIGH (ref 21–32)
Creatinine: 0.8 mg/dL (ref 0.60–1.30)
EGFR (African American): >60
EGFR (Non-African Amer.): >60
Glucose: 93 mg/dL (ref 65–99)
Osmolality: 272 (ref 275–301)
Potassium: 3.3 mmol/L — ABNORMAL LOW (ref 3.5–5.1)
Sodium: 133 mmol/L — ABNORMAL LOW (ref 136–145)

## 2013-07-02 LAB — BASIC METABOLIC PANEL
Anion Gap: 6 — ABNORMAL LOW (ref 7–16)
BUN: 31 mg/dL — ABNORMAL HIGH (ref 7–18)
Calcium, Total: 9.1 mg/dL (ref 8.5–10.1)
Chloride: 91 mmol/L — ABNORMAL LOW (ref 98–107)
Co2: 34 mmol/L — ABNORMAL HIGH (ref 21–32)
Creatinine: 0.94 mg/dL (ref 0.60–1.30)
EGFR (African American): >60
EGFR (Non-African Amer.): >60
Glucose: 107 mg/dL — ABNORMAL HIGH (ref 65–99)
Osmolality: 270 (ref 275–301)
Potassium: 3.7 mmol/L (ref 3.5–5.1)
Sodium: 131 mmol/L — ABNORMAL LOW (ref 136–145)

## 2013-07-02 LAB — STOOL CULTURE

## 2013-07-03 LAB — BASIC METABOLIC PANEL
Anion Gap: 4 — ABNORMAL LOW (ref 7–16)
BUN: 27 mg/dL — ABNORMAL HIGH (ref 7–18)
Calcium, Total: 8.8 mg/dL (ref 8.5–10.1)
Chloride: 91 mmol/L — ABNORMAL LOW (ref 98–107)
Co2: 37 mmol/L — ABNORMAL HIGH (ref 21–32)
Creatinine: 0.9 mg/dL (ref 0.60–1.30)
EGFR (African American): >60
EGFR (Non-African Amer.): >60
Glucose: 161 mg/dL — ABNORMAL HIGH (ref 65–99)
Osmolality: 273 (ref 275–301)
Potassium: 3.5 mmol/L (ref 3.5–5.1)
Sodium: 132 mmol/L — ABNORMAL LOW (ref 136–145)

## 2013-08-02 LAB — COMPREHENSIVE METABOLIC PANEL
Albumin: 3 g/dL — ABNORMAL LOW (ref 3.4–5.0)
Alkaline Phosphatase: 108 U/L
Anion Gap: 5 — ABNORMAL LOW (ref 7–16)
BUN: 11 mg/dL (ref 7–18)
Bilirubin,Total: 0.6 mg/dL (ref 0.2–1.0)
Calcium, Total: 8.7 mg/dL (ref 8.5–10.1)
Chloride: 97 mmol/L — ABNORMAL LOW (ref 98–107)
Co2: 29 mmol/L (ref 21–32)
Creatinine: 0.55 mg/dL — ABNORMAL LOW (ref 0.60–1.30)
EGFR (African American): >60
EGFR (Non-African Amer.): >60
Glucose: 376 mg/dL — ABNORMAL HIGH (ref 65–99)
Osmolality: 277 (ref 275–301)
Potassium: 3.7 mmol/L (ref 3.5–5.1)
SGOT(AST): 40 U/L — ABNORMAL HIGH (ref 15–37)
SGPT (ALT): 53 U/L (ref 12–78)
Sodium: 131 mmol/L — ABNORMAL LOW (ref 136–145)
Total Protein: 7.4 g/dL (ref 6.4–8.2)

## 2013-08-02 LAB — CBC WITH DIFFERENTIAL/PLATELET
Basophil #: 0.1 10*3/uL (ref 0.0–0.1)
Basophil %: 0.9 %
Eosinophil #: 0 10*3/uL (ref 0.0–0.7)
Eosinophil %: 0.1 %
HCT: 38.8 % (ref 35.0–47.0)
HGB: 12.2 g/dL (ref 12.0–16.0)
Lymphocyte #: 1.5 10*3/uL (ref 1.0–3.6)
Lymphocyte %: 16.8 %
MCH: 24.8 pg — ABNORMAL LOW (ref 26.0–34.0)
MCHC: 31.3 g/dL — ABNORMAL LOW (ref 32.0–36.0)
MCV: 79 fL — ABNORMAL LOW (ref 80–100)
Monocyte #: 0.6 x10 3/mm (ref 0.2–0.9)
Monocyte %: 6.4 %
Neutrophil #: 7 10*3/uL — ABNORMAL HIGH (ref 1.4–6.5)
Neutrophil %: 75.8 %
Platelet: 285 10*3/uL (ref 150–440)
RBC: 4.9 10*6/uL (ref 3.80–5.20)
RDW: 19.5 % — ABNORMAL HIGH (ref 11.5–14.5)
WBC: 9.2 10*3/uL (ref 3.6–11.0)

## 2013-08-04 ENCOUNTER — Inpatient Hospital Stay: Payer: Self-pay | Admitting: Family Medicine

## 2013-08-04 LAB — BASIC METABOLIC PANEL
Anion Gap: 6 — ABNORMAL LOW (ref 7–16)
BUN: 21 mg/dL — ABNORMAL HIGH (ref 7–18)
Calcium, Total: 9.2 mg/dL (ref 8.5–10.1)
Chloride: 94 mmol/L — ABNORMAL LOW (ref 98–107)
Co2: 35 mmol/L — ABNORMAL HIGH (ref 21–32)
Creatinine: 0.79 mg/dL (ref 0.60–1.30)
EGFR (African American): >60
EGFR (Non-African Amer.): >60
Glucose: 268 mg/dL — ABNORMAL HIGH (ref 65–99)
Osmolality: 282 (ref 275–301)
Potassium: 3.3 mmol/L — ABNORMAL LOW (ref 3.5–5.1)
Sodium: 135 mmol/L — ABNORMAL LOW (ref 136–145)

## 2013-08-04 LAB — CBC WITH DIFFERENTIAL/PLATELET
Basophil #: 0 10*3/uL (ref 0.0–0.1)
Basophil %: 0.3 %
Eosinophil #: 0 10*3/uL (ref 0.0–0.7)
Eosinophil %: 0 %
HCT: 36.5 % (ref 35.0–47.0)
HGB: 11.8 g/dL — ABNORMAL LOW (ref 12.0–16.0)
Lymphocyte #: 2.6 10*3/uL (ref 1.0–3.6)
Lymphocyte %: 20.2 %
MCH: 25.6 pg — ABNORMAL LOW (ref 26.0–34.0)
MCHC: 32.5 g/dL (ref 32.0–36.0)
MCV: 79 fL — ABNORMAL LOW (ref 80–100)
Monocyte #: 0.8 x10 3/mm (ref 0.2–0.9)
Monocyte %: 6.3 %
Neutrophil #: 9.5 10*3/uL — ABNORMAL HIGH (ref 1.4–6.5)
Neutrophil %: 73.2 %
Platelet: 329 10*3/uL (ref 150–440)
RBC: 4.62 10*6/uL (ref 3.80–5.20)
RDW: 18.9 % — ABNORMAL HIGH (ref 11.5–14.5)
WBC: 13 10*3/uL — ABNORMAL HIGH (ref 3.6–11.0)

## 2013-08-05 LAB — BASIC METABOLIC PANEL
Anion Gap: 4 — ABNORMAL LOW (ref 7–16)
BUN: 19 mg/dL — ABNORMAL HIGH (ref 7–18)
Calcium, Total: 8.9 mg/dL (ref 8.5–10.1)
Chloride: 91 mmol/L — ABNORMAL LOW (ref 98–107)
Co2: 38 mmol/L — ABNORMAL HIGH (ref 21–32)
Creatinine: 0.74 mg/dL (ref 0.60–1.30)
EGFR (African American): >60
EGFR (Non-African Amer.): >60
Glucose: 244 mg/dL — ABNORMAL HIGH (ref 65–99)
Osmolality: 277 (ref 275–301)
Potassium: 3.3 mmol/L — ABNORMAL LOW (ref 3.5–5.1)
Sodium: 133 mmol/L — ABNORMAL LOW (ref 136–145)

## 2013-11-29 ENCOUNTER — Inpatient Hospital Stay: Payer: Self-pay | Admitting: Psychiatry

## 2013-11-29 LAB — BEHAVIORAL MEDICINE 1 PANEL
Albumin: 3.1 g/dL — ABNORMAL LOW (ref 3.4–5.0)
Alkaline Phosphatase: 110 U/L
Anion Gap: 10 (ref 7–16)
BUN: 7 mg/dL (ref 7–18)
Basophil #: 0 10*3/uL (ref 0.0–0.1)
Basophil %: 0.5 %
Bilirubin,Total: 0.4 mg/dL (ref 0.2–1.0)
Calcium, Total: 9 mg/dL (ref 8.5–10.1)
Chloride: 100 mmol/L (ref 98–107)
Co2: 27 mmol/L (ref 21–32)
Creatinine: 0.75 mg/dL (ref 0.60–1.30)
EGFR (African American): >60
EGFR (Non-African Amer.): >60
Eosinophil #: 0 10*3/uL (ref 0.0–0.7)
Eosinophil %: 0.1 %
Glucose: 364 mg/dL — ABNORMAL HIGH (ref 65–99)
HCT: 42.2 % (ref 35.0–47.0)
HGB: 13.4 g/dL (ref 12.0–16.0)
Lymphocyte #: 1.5 10*3/uL (ref 1.0–3.6)
Lymphocyte %: 20.5 %
MCH: 25.3 pg — ABNORMAL LOW (ref 26.0–34.0)
MCHC: 31.8 g/dL — ABNORMAL LOW (ref 32.0–36.0)
MCV: 80 fL (ref 80–100)
Monocyte #: 0.4 x10 3/mm (ref 0.2–0.9)
Monocyte %: 6.1 %
Neutrophil #: 5.2 10*3/uL (ref 1.4–6.5)
Neutrophil %: 72.8 %
Osmolality: 287 (ref 275–301)
Platelet: 268 10*3/uL (ref 150–440)
Potassium: 3.7 mmol/L (ref 3.5–5.1)
RBC: 5.31 10*6/uL — ABNORMAL HIGH (ref 3.80–5.20)
RDW: 16.7 % — ABNORMAL HIGH (ref 11.5–14.5)
SGOT(AST): 54 U/L — ABNORMAL HIGH (ref 15–37)
SGPT (ALT): 64 U/L — ABNORMAL HIGH
Sodium: 137 mmol/L (ref 136–145)
Thyroid Stimulating Horm: 4.33 u[IU]/mL
Total Protein: 7.7 g/dL (ref 6.4–8.2)
WBC: 7.2 10*3/uL (ref 3.6–11.0)

## 2013-11-30 LAB — URINALYSIS, COMPLETE
Bilirubin,UR: NEGATIVE
Blood: NEGATIVE
Glucose,UR: >=500 mg/dL (ref 0–75)
Ketone: NEGATIVE
Leukocyte Esterase: NEGATIVE
Nitrite: NEGATIVE
Ph: 5 (ref 4.5–8.0)
Protein: NEGATIVE
RBC,UR: 1 /HPF (ref 0–5)
Specific Gravity: 1.015 (ref 1.003–1.030)
Squamous Epithelial: 2
WBC UR: 11 /HPF (ref 0–5)

## 2013-11-30 LAB — DRUG SCREEN, URINE
Amphetamines, Ur Screen: NEGATIVE (ref ?–1000)
Barbiturates, Ur Screen: NEGATIVE (ref ?–200)
Benzodiazepine, Ur Scrn: NEGATIVE (ref ?–200)
Cannabinoid 50 Ng, Ur ~~LOC~~: NEGATIVE (ref ?–50)
Cocaine Metabolite,Ur ~~LOC~~: NEGATIVE (ref ?–300)
MDMA (Ecstasy)Ur Screen: NEGATIVE (ref ?–500)
Methadone, Ur Screen: NEGATIVE (ref ?–300)
Opiate, Ur Screen: POSITIVE (ref ?–300)
Phencyclidine (PCP) Ur S: NEGATIVE (ref ?–25)
Tricyclic, Ur Screen: NEGATIVE (ref ?–1000)

## 2013-12-01 LAB — BASIC METABOLIC PANEL
Anion Gap: 10 (ref 7–16)
BUN: 9 mg/dL (ref 7–18)
Calcium, Total: 9.6 mg/dL (ref 8.5–10.1)
Chloride: 92 mmol/L — ABNORMAL LOW (ref 98–107)
Co2: 29 mmol/L (ref 21–32)
Creatinine: 0.9 mg/dL (ref 0.60–1.30)
EGFR (African American): >60
EGFR (Non-African Amer.): >60
Glucose: 290 mg/dL — ABNORMAL HIGH (ref 65–99)
Osmolality: 272 (ref 275–301)
Potassium: 3.2 mmol/L — ABNORMAL LOW (ref 3.5–5.1)
Sodium: 131 mmol/L — ABNORMAL LOW (ref 136–145)

## 2013-12-01 LAB — CBC WITH DIFFERENTIAL/PLATELET
Basophil #: 0 10*3/uL (ref 0.0–0.1)
Basophil %: 0.6 %
Eosinophil #: 0 10*3/uL (ref 0.0–0.7)
Eosinophil %: 0.2 %
HCT: 42.2 % (ref 35.0–47.0)
HGB: 13.6 g/dL (ref 12.0–16.0)
Lymphocyte #: 1.7 10*3/uL (ref 1.0–3.6)
Lymphocyte %: 22.4 %
MCH: 25.5 pg — ABNORMAL LOW (ref 26.0–34.0)
MCHC: 32.3 g/dL (ref 32.0–36.0)
MCV: 79 fL — ABNORMAL LOW (ref 80–100)
Monocyte #: 0.5 x10 3/mm (ref 0.2–0.9)
Monocyte %: 6.7 %
Neutrophil #: 5.4 10*3/uL (ref 1.4–6.5)
Neutrophil %: 70.1 %
Platelet: 278 10*3/uL (ref 150–440)
RBC: 5.34 10*6/uL — ABNORMAL HIGH (ref 3.80–5.20)
RDW: 16.8 % — ABNORMAL HIGH (ref 11.5–14.5)
WBC: 7.7 10*3/uL (ref 3.6–11.0)

## 2013-12-02 ENCOUNTER — Inpatient Hospital Stay: Payer: Self-pay | Admitting: Internal Medicine

## 2013-12-02 LAB — HEMOGLOBIN A1C: Hemoglobin A1C: 13.1 % — ABNORMAL HIGH (ref 4.2–6.3)

## 2013-12-02 LAB — BASIC METABOLIC PANEL
Anion Gap: 8 (ref 7–16)
BUN: 12 mg/dL (ref 7–18)
Calcium, Total: 9.8 mg/dL (ref 8.5–10.1)
Chloride: 96 mmol/L — ABNORMAL LOW (ref 98–107)
Co2: 28 mmol/L (ref 21–32)
Creatinine: 0.99 mg/dL (ref 0.60–1.30)
EGFR (African American): >60
EGFR (Non-African Amer.): >60
Glucose: 275 mg/dL — ABNORMAL HIGH (ref 65–99)
Osmolality: 274 (ref 275–301)
Potassium: 3.3 mmol/L — ABNORMAL LOW (ref 3.5–5.1)
Sodium: 132 mmol/L — ABNORMAL LOW (ref 136–145)

## 2013-12-02 LAB — MAGNESIUM: Magnesium: 1.1 mg/dL — ABNORMAL LOW

## 2013-12-03 LAB — COMPREHENSIVE METABOLIC PANEL
Albumin: 2.7 g/dL — ABNORMAL LOW (ref 3.4–5.0)
Alkaline Phosphatase: 83 U/L
Anion Gap: 10 (ref 7–16)
BUN: 10 mg/dL (ref 7–18)
Bilirubin,Total: 0.5 mg/dL (ref 0.2–1.0)
Calcium, Total: 8.8 mg/dL (ref 8.5–10.1)
Chloride: 101 mmol/L (ref 98–107)
Co2: 29 mmol/L (ref 21–32)
Creatinine: 0.77 mg/dL (ref 0.60–1.30)
EGFR (African American): >60
EGFR (Non-African Amer.): >60
Glucose: 193 mg/dL — ABNORMAL HIGH (ref 65–99)
Osmolality: 284 (ref 275–301)
Potassium: 3.4 mmol/L — ABNORMAL LOW (ref 3.5–5.1)
SGOT(AST): 51 U/L — ABNORMAL HIGH (ref 15–37)
SGPT (ALT): 49 U/L
Sodium: 140 mmol/L (ref 136–145)
Total Protein: 6.8 g/dL (ref 6.4–8.2)

## 2013-12-03 LAB — CLOSTRIDIUM DIFFICILE(ARMC)

## 2013-12-03 LAB — CBC WITH DIFFERENTIAL/PLATELET
Basophil #: 0.1 10*3/uL (ref 0.0–0.1)
Basophil %: 1.3 %
Eosinophil #: 0 10*3/uL (ref 0.0–0.7)
Eosinophil %: 0.2 %
HCT: 39.2 % (ref 35.0–47.0)
HGB: 12.8 g/dL (ref 12.0–16.0)
Lymphocyte #: 1.6 10*3/uL (ref 1.0–3.6)
Lymphocyte %: 24.1 %
MCH: 25.5 pg — ABNORMAL LOW (ref 26.0–34.0)
MCHC: 32.7 g/dL (ref 32.0–36.0)
MCV: 78 fL — ABNORMAL LOW (ref 80–100)
Monocyte #: 0.5 x10 3/mm (ref 0.2–0.9)
Monocyte %: 8 %
Neutrophil #: 4.3 10*3/uL (ref 1.4–6.5)
Neutrophil %: 66.4 %
Platelet: 241 10*3/uL (ref 150–440)
RBC: 5.02 10*6/uL (ref 3.80–5.20)
RDW: 16.8 % — ABNORMAL HIGH (ref 11.5–14.5)
WBC: 6.5 10*3/uL (ref 3.6–11.0)

## 2013-12-03 LAB — LIPID PANEL
Cholesterol: 136 mg/dL (ref 0–200)
HDL Cholesterol: 28 mg/dL — ABNORMAL LOW (ref 40–60)
Ldl Cholesterol, Calc: 53 mg/dL (ref 0–100)
Triglycerides: 273 mg/dL — ABNORMAL HIGH (ref 0–200)
VLDL Cholesterol, Calc: 55 mg/dL — ABNORMAL HIGH (ref 5–40)

## 2013-12-03 LAB — OCCULT BLOOD X 1 CARD TO LAB, STOOL: Occult Blood, Feces: POSITIVE

## 2013-12-04 LAB — POTASSIUM: Potassium: 3.4 mmol/L — ABNORMAL LOW (ref 3.5–5.1)

## 2013-12-08 LAB — STOOL CULTURE

## 2014-01-03 DIAGNOSIS — L97409 Non-pressure chronic ulcer of unspecified heel and midfoot with unspecified severity: Secondary | ICD-10-CM

## 2014-01-03 DIAGNOSIS — I509 Heart failure, unspecified: Secondary | ICD-10-CM | POA: Insufficient documentation

## 2014-01-03 DIAGNOSIS — E782 Mixed hyperlipidemia: Secondary | ICD-10-CM | POA: Insufficient documentation

## 2014-01-03 DIAGNOSIS — F419 Anxiety disorder, unspecified: Secondary | ICD-10-CM | POA: Insufficient documentation

## 2014-01-03 DIAGNOSIS — R0602 Shortness of breath: Secondary | ICD-10-CM | POA: Insufficient documentation

## 2014-01-03 DIAGNOSIS — F319 Bipolar disorder, unspecified: Secondary | ICD-10-CM | POA: Insufficient documentation

## 2014-01-03 DIAGNOSIS — E669 Obesity, unspecified: Secondary | ICD-10-CM

## 2014-01-03 DIAGNOSIS — I5189 Other ill-defined heart diseases: Secondary | ICD-10-CM | POA: Insufficient documentation

## 2014-01-03 DIAGNOSIS — J449 Chronic obstructive pulmonary disease, unspecified: Secondary | ICD-10-CM | POA: Insufficient documentation

## 2014-01-03 DIAGNOSIS — E11621 Type 2 diabetes mellitus with foot ulcer: Secondary | ICD-10-CM | POA: Insufficient documentation

## 2014-01-22 ENCOUNTER — Encounter: Payer: Self-pay | Admitting: Podiatry

## 2014-01-22 ENCOUNTER — Ambulatory Visit: Payer: Medicaid Other | Admitting: Podiatry

## 2014-01-22 ENCOUNTER — Ambulatory Visit (INDEPENDENT_AMBULATORY_CARE_PROVIDER_SITE_OTHER): Payer: Medicaid Other

## 2014-01-22 VITALS — BP 99/61 | HR 71 | Resp 16 | Ht 66.0 in | Wt 330.0 lb

## 2014-01-22 DIAGNOSIS — L03031 Cellulitis of right toe: Secondary | ICD-10-CM

## 2014-01-22 DIAGNOSIS — E1149 Type 2 diabetes mellitus with other diabetic neurological complication: Secondary | ICD-10-CM

## 2014-01-22 DIAGNOSIS — M79609 Pain in unspecified limb: Secondary | ICD-10-CM

## 2014-01-22 DIAGNOSIS — E1140 Type 2 diabetes mellitus with diabetic neuropathy, unspecified: Secondary | ICD-10-CM

## 2014-01-22 DIAGNOSIS — G629 Polyneuropathy, unspecified: Secondary | ICD-10-CM

## 2014-01-22 MED ORDER — CEPHALEXIN 500 MG PO CAPS: 500.0000 mg | ORAL_CAPSULE | Freq: Three times a day (TID) | ORAL | Status: DC

## 2014-01-22 NOTE — Patient Instructions (Addendum)
Betadine Soak Instructions  Purchase an 8 oz. bottle of BETADINE solution (Povidone)  THE DAY AFTER THE PROCEDURE  Place 1 tablespoon of betadine solution in a quart of warm tap water.  Submerge your foot or feet with outer bandage intact for the initial soak; this will allow the bandage to become moist and wet for easy lift off.  Once you remove your bandage, continue to soak in the solution for 20 minutes.  This soak should be done twice a day.  Next, remove your foot or feet from solution, blot dry the affected area and cover.  You may use a band aid large enough to cover the area or use gauze and tape.  Apply other medications to the area as directed by the doctor such as cortisporin otic solution (ear drops) or neosporin.  IF YOUR SKIN BECOMES IRRITATED WHILE USING THESE INSTRUCTIONS, IT IS OKAY TO SWITCH TO EPSOM SALTS AND WATER OR WHITE VINEGAR AND WATER.  Monitor for any signs/symptoms of infection. Call the office immediately if any occur or go directly to the emergency room. Call with any questions/concerns.  

## 2014-01-22 NOTE — Progress Notes (Signed)
Subjective:    Patient ID: [name] [name], female    DOB: July 29, 1960, 53 y.o.   MRN: [id number]  HPI Comments: [name] [name], 53 year old female, presents the office today with complaints of right big toe nail pain, infection. She states that his been ongoing the last 3 weeks and has been progressive. She says the pain is worse with ambulation and pressure over the area. Also states that she has a growth on the bottom of her big toe which has been present for many years and has not changed. She was seen by her primary care physician for this. Denies any systemic complaints as fevers, chills, nausea, vomiting. She states that she is diabetic and her blood sugar was last checked and was in the 300s this morning however she states she gets up to 589. No recent injury or trauma to the feet. No other complaints at this time.     Review of Systems  Constitutional:       Sweating   Endocrine: Positive for heat intolerance.       Excessive thirst  Genitourinary: Positive for frequency.  Musculoskeletal: Positive for back pain.       Joint pain Difficulty walking  Skin:       Change in nails  Neurological: Positive for weakness and numbness.  Hematological:       Slow to heal  Psychiatric/Behavioral: The patient is nervous/anxious.   All other systems reviewed and are negative.      Objective:   Physical Exam AAO x3, NAD DP/PT pulses palpable b/l. CRT < 3 sec. +pedal hair Mild decrease in protective sensation with Dorann Ou monofilament. Vibratory sensation intact, Achilles tendon reflex intact. Right hallux erythema along the distal aspect of the toe starting at the level of the IPJ distally. There is tenderness to palpation overlying the nail site along both the medial, lateral, proximal nail borders.. Edema surrounding the nail. No ascending cellulitis. No areas of fluctuance, crepitus, malodor. Mild drainage from the nail site.  Soft tissue mass on the plantar aspect of the right  hallux under the proximal phalanx. The mass is soft, mobile, with some evidence of fluid within it. Overlying skin intact without any changes.  No open lesions or pre-ulcerative lesions. Remaining nails without any surrounding erythema, drainage. No calf pain, swelling, warmth. MMT 5/5, ROM WNL      Assessment & Plan:  53 year old female right hallux ingrown toenail with localized cellulitis. -Conservative versus surgical treatment were discussed including alternatives, risks, complications. At this time there is pain around the nail with infection. Discussed nail removal due to the infection. However discussed with the patient given her uncontrolled diabetes and other medical conditions she is at high risk of nonhealing of this wound site and could lead to limb loss. Patient understands risks of procedure and wishes to proceed with nail removal. Under sterile skin preparation a total of 2.5 cc of a one-to-one mixture of 2% lidocaine plain and 0.5% Marcaine plain was infiltrated in a hallux block fashion on the right foot. The right hallux was then prepped in a sterile fashion. The right hallux nail was excised in total. Care was taken to the entire nail borders. There is noted to be significant ingrowing of both the medial and lateral nail borders. Once the nail was removed there is no purulence identified and underlying skin intact. Area was copiously irrigated and antibiotic ointment and a dry sterile dressing was then applied. Following the procedure there is noted to be  an immediate capillary refill time noted to the digit. Patient tolerated the procedure well without complications. -Post procedure instructions discussed the patient for which he verbally understood. -Rx Keflex -Monitor for any signs or symptoms of worsening infection and directed to call the office immediately if any are to occur or go directly to the emergency room. Followup in 1 week or sooner if any problems are to arise. In the  meantime call the office with any questions, concerns. Followup with PCP for other issues mentioned and review of systems.

## 2014-01-29 ENCOUNTER — Ambulatory Visit (INDEPENDENT_AMBULATORY_CARE_PROVIDER_SITE_OTHER): Payer: Medicaid Other | Admitting: Podiatry

## 2014-01-29 VITALS — BP 125/74 | HR 97 | Resp 16

## 2014-01-29 DIAGNOSIS — L03031 Cellulitis of right toe: Secondary | ICD-10-CM

## 2014-01-29 MED ORDER — CLINDAMYCIN HCL 300 MG PO CAPS: 300.0000 mg | ORAL_CAPSULE | Freq: Three times a day (TID) | ORAL | Status: DC

## 2014-01-29 MED ORDER — CIPROFLOXACIN HCL 500 MG PO TABS: 500.0000 mg | ORAL_TABLET | Freq: Two times a day (BID) | ORAL | Status: DC

## 2014-01-29 NOTE — Patient Instructions (Signed)

## 2014-01-30 NOTE — Progress Notes (Signed)
Patient ID: [name] [name], female   DOB: 08/06/1960, 53 y.o.   MRN: [id number]  Subjective: [name] [name], 53 year old female, presents the office they follow elevation of right hallux paronychia localized cellulitis. She states that she's been continuing with Keflex. She does state that she believes the redness has increased over the end of the toe however does not think it has spread. She had some chills yesterday but denies any current fevers, chills, nausea, vomiting. States that she's had a history of bilateral arch of a cellulitis for which she has had to be on multiple antibiotics No other complaints at this time in no acute changes since last appointment.  Objective: AAO x3, NAD DP/PT pulses palpable bilaterally, CRT less than 3 seconds Protective sensation decreased with Simms Weinstein monofilament. Status post right hallux total nail avulsion. There is erythema to the distal aspect of the digit starting at the proximal level of the IPJ distally. There is no areas of fluctuance, crepitus. No purulence was identified from the procedure site. No ascending cellulitis. Wound base if fibro-granular.  No calf pain, swelling, warmth.  Assessment: 53 year old female status post right hallux total nail avulsion with localized cellulitis.  Plan: -Treatment options discussed including alternatives, risks, complications. -At this time recommended switching to Epsom salt soaks twice a day followed by antibiotic ointment and a Band-Aid. -Discontinue Keflex and start clindamycin and ciprofloxacin. -Blood sugar control. -Continue to monitor for any clinical signs or symptoms of worsening infection and directed to call the office immediately if any are to occur or go directly to the emergency room. Discussed that if she structures any redness streaks or any intensification of the erythema she is to call the office immediately or go to the emergency room and not wait until her followup. -Followup in 1 week  or sooner if any problems are to arise. In the meantime call the office with any questions, concerns.

## 2014-02-05 ENCOUNTER — Ambulatory Visit: Payer: Medicaid Other | Admitting: Podiatry

## 2014-02-07 ENCOUNTER — Ambulatory Visit (INDEPENDENT_AMBULATORY_CARE_PROVIDER_SITE_OTHER): Payer: Medicaid Other | Admitting: Podiatry

## 2014-02-07 VITALS — BP 107/68 | HR 80 | Resp 16

## 2014-02-07 DIAGNOSIS — L03031 Cellulitis of right toe: Secondary | ICD-10-CM

## 2014-02-07 NOTE — Progress Notes (Signed)
Patient ID: [name] [name], female   DOB: 1961/01/25, 53 y.o.   MRN: [id number]  Subjective: Patient returns the office they for followup evaluation status post right hallux nail avulsion secondary to infection. She states that she has been continuing with clindamycin and ciprofloxacin for which she has finished her prescription yesterday. She has been soaking her feet in Epson salts until 2 days ago as she was out of town. She states there is no pain at this time noted to the digit or around the nail. She states there is been improved erythema around the procedure site. Denies any drainage. Denies any systemic complaints as fevers, chills, nausea, vomiting. No other complaints at this time. No acute changes since last appointment.  Objective: AAO x3, NAD  DP/PT pulses palpable b/l. CRT < 3 sec. +pedal hair  Mild decrease in protective sensation with Dorann Ou monofilament. Vibratory sensation intact, Achilles tendon reflex intact Right hallux status post nail avulsion. There is evidence of hyperkeratotic tissue overlying the nail bed with some evidence of dried blood underneath the callus. Upon debridement of the callus there is a small area of superficial granulation tissue within the central aspect the nailbed. There was no purulence. There is significant decrease in erythema around the nail site. There is no ascending cellulitis, and fluctuance, crepitus, malodor, purulence. No calf pain, swelling, warmth.  Assessment: 53 year old female status post right hallux nail avulsion secondary to infection with resolving infection.  Plan: -Conservative versus surgical treatment discussed including alternatives, risks, complications. -Hyperkeratotic tissue overlying procedure site debridement without complications  -At this time continued antibiotic ointment and a Band-Aid over the procedure site. Also continue soaking in Epson salts twice a day. -At this time there is been significant decrease in  erythema and there is no pain at this time. -Monitor for any clinical signs or symptoms of infection and directed to call the office immediately if any are to occur or go directly to the emergency room. Discussed the patient that if there is any returning erythema to call the office and we will restart antibiotics and have her seen. -Followup in 2 weeks to ensure healing or sooner if any palms are to arise or any changes symptoms. In the meantime call the office with any questions, concerns.

## 2014-02-07 NOTE — Patient Instructions (Signed)
Continue soaking in epsom salts twice a day followed by antibiotic ointment and a band-aid until healed.  Monitor for any signs/symptoms of infection. Call the office immediately if any occur or go directly to the emergency room. Call with any questions/concerns.

## 2014-02-21 ENCOUNTER — Ambulatory Visit (INDEPENDENT_AMBULATORY_CARE_PROVIDER_SITE_OTHER): Payer: Medicaid Other | Admitting: Podiatry

## 2014-02-21 VITALS — BP 110/69 | HR 74 | Resp 16

## 2014-02-21 DIAGNOSIS — E1140 Type 2 diabetes mellitus with diabetic neuropathy, unspecified: Secondary | ICD-10-CM

## 2014-02-21 DIAGNOSIS — E1149 Type 2 diabetes mellitus with other diabetic neurological complication: Secondary | ICD-10-CM

## 2014-02-21 DIAGNOSIS — L03031 Cellulitis of right toe: Secondary | ICD-10-CM

## 2014-02-21 NOTE — Patient Instructions (Addendum)
Continue to soak in epsom salts twice a day until completely healed. Cover with antibiotic ointment and a band-aid. Can leave uncovered at night.  Monitor for any signs/symptoms of infection. Call the office immediately if any occur or go directly to the emergency room. Call with any questions/concerns.  Follow up in 2 weeks if not completely healed.

## 2014-02-22 NOTE — Progress Notes (Signed)
Patient ID: [name] [name], female   DOB: May 10, 1960, 53 y.o.   MRN: [id number]  Subjective: Patient returns the office they for follow-up evaluation status post right hallux nail avulsion secondary to infection. She states that she's been continuing with Epson salt soaks twice a day followed by antibiotic ointment and a Band-Aid. She states she feels as there is significant improvement compared to prior. She denies any recent redness or any streaking. No drainage or pain.She is no longer taking any antibiotics. Denies any systemic complaints as fevers, chills, nausea, vomiting. Her blood glucose is better controlled running in the 100s. No acute changes since last appointment. No other complaints at this time.  Objective: AAO 3, NAD DP/PT pulses palpable bilaterally, CRT less than 3 seconds, + pedal hair Decrease in protective sensation with Simms Weinstein monofilament. Right hallux status post nail avulsion. There is a small amount of red granular tissue within the central aspect of the nail bed otherwise the nail site has healed. There is no ascending cellulitis, purulence, drainage, tenderness to palpation. No calf pain, swelling, warmth, erythema  Assessment: 53 year old female status post right hallux nail avulsion secondary to infection, healing  Plan: -Treatment options discussed including alternatives, risks, complications. -The site was cleaned and lightly debrided without complications -Continue soaking in Epson salts twice a day followed by anabolic ointment and a Band-Aid during the day and can leave uncovered at night until completely healed. Continue to monitor for any clinical signs or symptoms of infection and directed to call the office immediately if any are to occur or go to the emergency room. -Follow-up as needed. I discussed with the patient that at this site has not healed completely in 2 weeks to call the office and make an appointment. Also call there any changes or  concerns. In the meantime call the office with any questions, concerns.

## 2014-04-22 ENCOUNTER — Other Ambulatory Visit: Payer: Self-pay | Admitting: Cardiovascular Disease

## 2014-05-02 ENCOUNTER — Other Ambulatory Visit: Payer: Self-pay | Admitting: Rheumatology

## 2014-05-02 DIAGNOSIS — M0609 Rheumatoid arthritis without rheumatoid factor, multiple sites: Secondary | ICD-10-CM

## 2014-05-03 ENCOUNTER — Ambulatory Visit
Admission: RE | Admit: 2014-05-03 | Discharge: 2014-05-03 | Disposition: A | Discharge status: Home / Self Care | Payer: Medicaid Other | Source: Ambulatory Visit | Attending: Rheumatology | Admitting: Rheumatology

## 2014-05-03 DIAGNOSIS — M0609 Rheumatoid arthritis without rheumatoid factor, multiple sites: Secondary | ICD-10-CM

## 2014-05-03 MED ORDER — IOHEXOL 300 MG/ML  SOLN
100.0000 mL | Freq: Once | INTRAMUSCULAR | Status: AC | PRN
  Administered 2014-05-03: 100 mL via INTRAVENOUS

## 2014-05-27 ENCOUNTER — Emergency Department: Payer: Self-pay | Admitting: Emergency Medicine

## 2014-05-27 LAB — CBC
HCT: 37.5 % (ref 35.0–47.0)
HGB: 12.1 g/dL (ref 12.0–16.0)
MCH: 25.9 pg — ABNORMAL LOW (ref 26.0–34.0)
MCHC: 32.2 g/dL (ref 32.0–36.0)
MCV: 80 fL (ref 80–100)
Platelet: 221 10*3/uL (ref 150–440)
RBC: 4.67 10*6/uL (ref 3.80–5.20)
RDW: 17.3 % — ABNORMAL HIGH (ref 11.5–14.5)
WBC: 7.8 10*3/uL (ref 3.6–11.0)

## 2014-05-27 LAB — BASIC METABOLIC PANEL
Anion Gap: 8 (ref 7–16)
BUN: 6 mg/dL — ABNORMAL LOW (ref 7–18)
Calcium, Total: 8.5 mg/dL (ref 8.5–10.1)
Chloride: 100 mmol/L (ref 98–107)
Co2: 28 mmol/L (ref 21–32)
Creatinine: 0.83 mg/dL (ref 0.60–1.30)
EGFR (African American): >60
EGFR (Non-African Amer.): >60
Glucose: 270 mg/dL — ABNORMAL HIGH (ref 65–99)
Osmolality: 279 (ref 275–301)
Potassium: 3.8 mmol/L (ref 3.5–5.1)
Sodium: 136 mmol/L (ref 136–145)

## 2014-05-27 LAB — TROPONIN I: Troponin-I: <0.02 ng/mL

## 2014-06-02 ENCOUNTER — Other Ambulatory Visit: Payer: Self-pay | Admitting: Cardiovascular Disease

## 2014-07-03 DIAGNOSIS — G4733 Obstructive sleep apnea (adult) (pediatric): Secondary | ICD-10-CM | POA: Insufficient documentation

## 2014-07-03 DIAGNOSIS — I5032 Chronic diastolic (congestive) heart failure: Secondary | ICD-10-CM | POA: Insufficient documentation

## 2014-08-09 NOTE — H&P (Signed)
PATIENT NAME:  [name] [name], HITCHCOCK MR#:  [id number] DATE OF BIRTH:  10-16-1960  DATE OF ADMISSION:  09/19/2012  ER REFERRING PHYSICIAN: Maricela Bo, MD  CARDIOLOGIST: Lorine Bears, MD   PRIMARY CARE PHYSICIAN: Dr. Mayford Knife at [facility]  CHIEF COMPLAINT: Leg swelling, shortness of breath.   HISTORY OF PRESENT ILLNESS: The patient is a 54 year old female with past medical history of rheumatoid arthritis,  morbid obesity, diastolic CHF, hypothyroidism, has multiple admissions in the last few months with similar complaint to the hospital with worsening heart failure and leg swelling, taking all her home medications regularly and being compliant with fluid restriction and salt restriction in her diet.  She noticed her legs are getting swollen for the last 2 days, and today morning she woke up and weighed herself.  She gained more than 5 pounds weight than yesterday, and her legs were red and warm today, feels like they are touching a heater.  So, she called her primary care doctor, and he suggested to go to the Emergency Room. On arrival to ER, her BNP is normal, but her legs are red; and due to her weight gain, she is being admitted as CHF exacerbation.    REVIEW OF SYSTEMS: On further questioning: CONSTITUTIONAL: Negative for fever, fatigue, weakness or pain but had weight gain.  EYES: No blurring, double vision. No redness or discharge.  EARS, NOSE, THROAT: No tinnitus, ear pain or hearing loss.  RESPIRATORY: No cough, wheezing, but has some shortness of breath.  CARDIOVASCULAR: No chest pain, orthopnea arrhythmia, but had edema on the legs which is worsening.  GASTROINTESTINAL: No nausea, vomiting, diarrhea or abdominal pain.  GENITOURINARY: No dysuria, hematuria or increased frequency of urination.  ENDOCRINOLOGY: No polyhydria, nocturia or heat or cold intolerance.  SKIN: No acne or rashes but bilateral legs, lower half, are red and warm.  MUSCULOSKELETAL: No pain or swelling in the joints.   NEUROLOGICAL: No numbness, weakness, tremors or headache.   PAST MEDICAL HISTORY: Diastolic CHF, obstructive sleep apnea, morbid obesity, hypertension, rheumatoid arthritis, anxiety and depression.  FAMILY HISTORY: Congestive heart failure in father.   PAST SURGICAL HISTORY: Bladder pacemaker, hiatus hernia repair surgery in the past and gastric band surgery.   SOCIAL HISTORY: She was a smoker 27 years 2 packs per day, quit smoking in 2003. No alcohol. No illicit drug use. Lives independently and able to ambulate without any support.  HOME MEDICATIONS:  Abilify 5 mg once a day, acetaminophen and oxycodone 325/5 mg oral tablet every 6 hours as needed for pain, Advair Diskus 1 puff 2 times a day, Combivent 1 puff inhaled 4 times a day, folic acid 1 mg 2 tablets orally once a day, furosemide 40 mg 2 times a day, levothyroxine 88 mcg once a day, metformin 500 mg oral tablet 2 times a day, methotrexate 25 mg/mL injectable solution, give 1 mL injectable solution once a week on Monday, Neurontin 300 mg oral capsule 3 times a day, Phenergan 1 tablet every 4 to 6 hours as needed for nausea and vomiting, potassium chloride 20 mEq 1 tablet once a day, prednisone 5 mg oral tablet once a day, Prozac 20 mg oral capsule 4 caps 80 mg once a day, Spiriva 18 mcg inhalation capsule once a day, trazodone 100 mg oral tablet once a day.   PHYSICAL EXAMINATION: VITAL SIGNS: In the ER, temperature 98.3, pulse rate 83, respirations 18, blood pressure 127/75 and pulse ox 98 on room air.  GENERAL: The patient is morbidly obese,  fully alert and oriented to time, place and person, in no acute distress.  HEENT: Head and neck atraumatic. Conjunctivae pink. Oral mucosa moist.  NECK: Supple. No JVD.  RESPIRATORY: Bilateral equal air entry, few crepitations present. Distant air sound due to morbid obesity.  CARDIOVASCULAR: S1, S2 present, regular. No murmur.  ABDOMEN: Soft, obese, nontender. Bowel sounds present.  SKIN: No  rashes, bilateral lower leg redness of the skin present and warm. Legs, as mentioned above, red and warm, pitting edema present.  NEUROLOGICAL: Power 5 out of 5, moves all 4 limbs. No tremors.   LABORATORY AND RADIOLOGICAL DATA:  Glucose 398, BNP 103, BUN 12, creatinine 0.77, sodium 136, potassium 3.4, calcium 8.4. WBC 5.5, hemoglobin 11.3, platelet count 202 and RDW 16.3.   Chest x-ray, PA and lateral: Findings suggestive of low-grade congestive heart failure with mild interstitial edema, overall no dramatic interval change since the earlier study.  ASSESSMENT: A 54 year old female with morbid obesity, diastolic congestive heart failure and recurrent admissions for the same complaints, came with shortness of breath, increased 5 pounds weight in 1 day and leg swelling, reddening and warm legs bilaterally.   PLAN: 1.  Diastolic congestive heart failure exacerbation:  BNP is normal, but due to obesity BNP can be falsely low. Chest x-ray suggests mild CHF, and she has weight gain and shortness of breath, so we will treat with IV Lasix and will call Dr. Kirke Corin, her cardiologist, to come and see her in the hospital.  2.  Cellulitis of legs:  Chronic lymphatic changes are present in both legs, and on top of that legs are now red and warm. Will give IV Rocephin and see for changes.  3. Diabetes: She is taking metformin, but her hyperglycemia may be partially contributed by prednisone at home. We will start her on glipizide and insulin sliding scale coverage with metformin.  4.  Rheumatoid arthritis:  She is taking methotrexate and prednisone, will continue.  5.  Depression: She was on trazodone, will continue.  6.  Hypokalemia:  We will replace orally and recheck.  7.  Sleep apnea, and as she was a smoker, there might be a component of chronic obstructive pulmonary disease also. Obesity, hypoventilation may be playing a role in her repeated admissions for shortness of breath and worsening CHF. We will call  Dr. Meredeth Ide to come and see her in the hospital for these issues and suggest some intervention.   CODE STATUS:  FULL CODE.     TOTAL TIME SPENT: 60 minutes.   ____________________________ Hope Pigeon Elisabeth Pigeon, MD vgv:cb D: 09/19/2012 21:59:38 ET T: 09/19/2012 22:15:33 ET JOB#: [id number]  cc: Hope Pigeon. Elisabeth Pigeon, MD, <Dictator> Altamese Dilling MD ELECTRONICALLY SIGNED 10/02/2012 22:16

## 2014-08-09 NOTE — Consult Note (Signed)
General Aspect [name] [name] is a 54 year old female w/ PMHx s/f HFpEF, frequent admissions for CHF decompensation, morbid obesity s/p lap band and subsequent removal last year, OSA/OHS (intolerant to CPAP), HTN, steroid-induced DM2, COPD, h/o tobacco abuse, bladder dysfunction (? neurogenic) s/p bladder pacemaker, hypothyroidism, RA (on MTX, steroids), CVI, anxiety and depression who was admitted to [facility] yesterday for A/C diastolic CHF.   She was last seen by Dr. Kirke Corin in 11/2012 in follow-up. No prior ischemic eval. She is fairly resistent to diuretics, but had been maintained on torsemide 60 BID and metolazone 2.5 qweekly. Bladder dysfunction makes diuresis more difficult and usually she requires Foley catheterization while hospitalized. She had multiple recurrent admissions to [facility] due to fluid overload in spite of taking oral diuretics at home. She usually responds well to IV diuretics. Most recent admission was on June 27. Baseline weight 362-363 lbs. Follow-up weight 367 lbs. She was noted to be dehydrated with electrolyte abnormalities, however, and diuretics were not changed. Mention of increased metolazone to 2x/week dosing.   She called the office yesterday c/o increased weight gain (~372 lbs), increased LE edema and leg blisters. She notes worsening SOB/DOE over the past week. She had an episode of fleeting L sided, sharp chest pain lasting a few seconds, aggravated by inspiration and position changes. She has been taking her diuretics as prescribed. She does wear CPAP at home now. She has been eating canned foods- vegetables and soup since it has been cold. She has noticed bilateral LE erythema and tenderness. She has an appointment to see her urologist this month d/t ongoing bladder dysfunction, possible surgery. Has not used prednisone recently. She was advised to present to the ED.  2D echo 06/2012: EF 55-60%, normal LV/RV size & function, normal RVSP, no WMAs   Present Illness There, EKG  revealed NSR, no ST/T changes. Initial TnI WNL. BNP 70. Na mildly reduced at 134. Microcytic anemia apparent- MCV 78, Hgb 11.5/Hct 34.8. CXR showed cardiomegaly with vascular congestion, early edema. She was admitted by the medicine service and started on Lasix 40mg  IV q6hr. LFTs this AM showed hypoalbuminemia at 2.8. TnI WNL x 3.   PAST MEDICAL HISTORY:  1. Chronic diastolic congestive heart failure.  2. Obesity.  3. Obstructive sleep apnea, on CPAP.  4. Rheumatoid arthritis.  5. Hypothyroidism.  6. Anxiety and depression.   PAST SURGICAL HISTORY:  1. Bladder surgery.  2. Pacemaker.  3. Hiatal hernia repair.  4. Gastric band surgery.   PSYCHOSOCIAL HISTORY: The patient lives at home. No alcohol. No illicit drug use. The patient quit smoking in 2003.   FAMILY HISTORY: Her mother had MI in her 18s. Brother had a stent at age of 13. Another brother with stent in 33s.   ALLERGIES:  1. CODEINE.  2. DARVOCET.  3. HYDROCODONE WITH TYLENOL.  4. SULFA DRUGS.   Physical Exam:  GEN well developed, no acute distress, obese   HEENT pink conjunctivae, PERRL, hearing intact to voice   NECK supple  No masses  trachea midline  redundant neck tissue, no appreciable JVD or bruits   RESP normal resp effort  no use of accessory muscles  distant breath sounds, no appreciable rales, wheezes or rhonchi   CARD Regular rate and rhythm  Normal, S1, S2  No murmur   ABD denies tenderness  soft  distended  normal BS   EXTR negative cyanosis/clubbing, 1+ bilateral pretibial edema, erythema, calor, tenderness to palpation of bilateral LEs   SKIN positive rashes,  skin turgor good   NEURO follows commands, motor/sensory function intact   PSYCH alert, A+O to time, place, person, good insight   Review of Systems:  Subjective/Chief Complaint weight gain, SOB, swelling   General: Weight loss or gain   Skin: Rashes   ENT: No Complaints    Eyes: No Complaints    Neck: No Complaints     Respiratory: No Complaints  Short of breath   Cardiovascular: Dyspnea  Orthopnea  Edema   Gastrointestinal: No Complaints    Genitourinary: Frequent urination  urinary retention and incontinence   Vascular: No Complaints    Musculoskeletal: No Complaints    Neurologic: No Complaints    Hematologic: No Complaints    Endocrine: No Complaints    Psychiatric: No Complaints    Review of Systems: All other systems were reviewed and found to be negative   Medications/Allergies Reviewed Medications/Allergies reviewed    Lab Results:  Cardiac:  05-Nov-14 18:33   Troponin I < 0.02 (0.00-0.05 0.05 ng/mL or less: NEGATIVE  Repeat testing in 3-6 hrs  if clinically indicated. >0.05 ng/mL: POTENTIAL  MYOCARDIAL INJURY. Repeat  testing in 3-6 hrs if  clinically indicated. NOTE: An increase or decrease  of 30% or more on serial  testing suggests a  clinically important change)  CK, Total 46  CPK-MB, Serum  < 0.5 (Result(s) reported on 22 Feb 2013 at 04:10AM.)  06-Nov-14 04:27   Troponin I < 0.02 (0.00-0.05 0.05 ng/mL or less: NEGATIVE  Repeat testing in 3-6 hrs  if clinically indicated. >0.05 ng/mL: POTENTIAL  MYOCARDIAL INJURY. Repeat  testing in 3-6 hrs if  clinically indicated. NOTE: An increase or decrease  of 30% or more on serial  testing suggests a  clinically important change)    08:35   Troponin I < 0.02 (0.00-0.05 0.05 ng/mL or less: NEGATIVE  Repeat testing in 3-6 hrs  if clinically indicated. >0.05 ng/mL: POTENTIAL  MYOCARDIAL INJURY. Repeat  testing in 3-6 hrs if  clinically indicated. NOTE: An increase or decrease  of 30% or more on serial  testing suggests a  clinically important change)  CK, Total 44  CPK-MB, Serum  < 0.5 (Result(s) reported on 22 Feb 2013 at 09:20AM.)  Routine Hem:  05-Nov-14 18:33   Hemoglobin (CBC)  11.5  Hematocrit (CBC)  34.8  MCV  78  06-Nov-14 04:27   Hemoglobin (CBC)  10.4  Hematocrit (CBC)  31.5  MCV  76   EKG:   Interpretation EKG shows NSR, no ST/T changes   Rate 86   EKG Comparision Not changed from  11/2012   Radiology Results: XRay:    05-Nov-14 18:51, Chest PA and Lateral  Chest PA and Lateral   REASON FOR EXAM:    SOB  COMMENTS:       PROCEDURE: DXR - DXR CHEST PA (OR AP) AND LATERAL  - Feb 21 2013  6:51PM     CLINICAL DATA:  Shortness of breath, chest pain, history of CHF    EXAM:  CHEST  2 VIEW    COMPARISON:  10/13/2012.    FINDINGS:  Cardiomegaly with mild vascular and interstitial prominence. Early  edema not excluded. Minor basilar atelectasis. No effusion or  pneumothorax. No collapse or consolidation. Negative for pneumonia.  Trachea is midline.     IMPRESSION:  Cardiomegaly with vascular congestion versus early edema      Electronically Signed    By: Ruel Favors M.D.    On: 02/21/2013 19:05  Verified By: Sigurd Sos, M.D.,    Darvocet - N: SOB, Other  Codeine: Other  Sulfa drugs: Other  Hydrocodone/ APAP: Other  Vital Signs/Nurse's Notes: **Vital Signs.:   06-Nov-14 05:26  Vital Signs Type Routine  Temperature Temperature (F) 97.7  Celsius 36.5  Temperature Source oral  Pulse Pulse 80  Respirations Respirations 18  Systolic BP Systolic BP 108  Diastolic BP (mmHg) Diastolic BP (mmHg) 70  Mean BP 82  Pulse Ox % Pulse Ox % 91  Pulse Ox Activity Level  At rest  Oxygen Delivery Room Air/ 21 %  *Intake and Output.:   Daily 06-Nov-14 07:00  Grand Totals Intake:   Output:  2675    Net:  -2675 24 Hr.:  -2675  Urine ml     Out:  2675  Length of Stay Totals Intake:   Output:  2675    Net:  -2675    Impression 54 year old female w/ PMHx s/f HFpEF, frequent admissions for CHF decompensation, morbid obesity s/p lap band and subsequent removal last year, OSA/OHS (intolerant to CPAP), HTN, steroid-induced DM2, COPD, h/o tobacco abuse, bladder dysfunction (? neurogenic) s/p bladder pacemaker, hypothyroidism, RA (on MTX, steroids), CVI,  anxiety and depression who was admitted to [facility] yesterday for A/C diastolic CHF. Cardiology consulted for CHF management.   1. Acute on chronic diastolic CHF 10+ lbs weight gain, increased LE edema and increased DOE/SOB.  new bilateral leg redness, warmth, tenderness. She has been eating canned foods and soups at home. No chest pain. She continues to take diuretics as prescribed. Objectively, CXR indicates increased vascular congestion and early edema. Weight is above baseline (372 lbs on admission). BNP normal- likely falsy given morbid obesity. 2+ LE edema on exam. Total I/O - 4675 mL since admission. Weight down 2 lbs. Symptoms and swelling improved per patient. Exacerbation from increased salt intake at home, oral diuretic resistance, possible cellulitis, urinary dysfunction and morbid obesity. Baseline CVI.  -- Scale Lasix back to 40mg  IV BID with KDur 20 mEq BID -- Serial BMPs to follow-up renal function, electrolytes -- Home health CHF RN- multiple admissions **** also needs home nursing to place indwelling foley PRN -- Provide CHF education, -- Stressed importance of salt/fluid restriction   2. Possible cellulitis Bilateral LE erythema, calor and tenderness to palpation.  Checking LE dopplers as well. Suspect chronic skin changes from leg edema.   Plan . 3. Urinary dysfunction H/o urinary retention, ? neurogenic bladder s/p bladder PM. No evidence of postrenal/obstructive renal injury.  -- Would benefit from keeping Foley cath in until follow-up with her urologist or intermittent placement by home health RN once fluid accumulates -- Follow-up with OP urologist in 2 weeks as previously scheduled  4. Microcytic anemia -- Work-up per primary team  5. OHS/OSA -- Continued CPAP use  6. Morbid obesity -- Stressed weight loss -- Consider nutrition consult  6. Steroid-induced DM2 -- Management per primary team  7. HTN Well-controlled. -- Continue current antihypertensives.    Electronic Signatures: Gery Pray (PA-C)  (Signed 06-Nov-14 14:11)  Authored: General Aspect/Present Illness, History and Physical Exam, Review of System, Home Medications, Labs, EKG , Radiology, Allergies, Vital Signs/Nurse's Notes, Impression/Plan Julien Nordmann (MD)  (Signed [id number] 14:17)  Authored: History and Physical Exam, Review of System, EKG , Impression/Plan  Co-Signer: General Aspect/Present Illness, Home Medications, EKG , Radiology, Allergies   Last Updated: 06-Nov-14 14:17 by Julien Nordmann (MD)

## 2014-08-09 NOTE — Discharge Summary (Signed)
PATIENT NAME:  [name] [name], [name] [name] MR#:  [id number] DATE OF BIRTH:  1960-11-13  DATE OF ADMISSION:  02/21/2013 DATE OF DISCHARGE:  02/25/2013  ADMITTING DIAGNOSES: 1. Acute respiratory failure.   DISCHARGE DIAGNOSES  1.  Acute respiratory failure due to acute diastolic congestive heart failure.  2. Bilateral lower extremity cellulitis, improved.  3. Mild ileus resolved, without any intervention.  4. Hypertension.  5. Obstructive sleep apnea.  6. Morbid obesity.  7. Chronic diastolic congestive heart failure.  8. Rheumatoid arthritis.  9. Hypothyroidism.  10. Anxiety and depression.  11. Urinary retention with Foley kept in place. She will follow up with her urologist coming up  this week for removal.  12. Status post bladder surgery with the pacemaker placement.  13. Hiatal hernia.  14. Status post gastric band surgery.   CONSULTANTS DURING HOSPITALIZATION: Dr. Kirke Corin.  PERTINENT LABS AND EVALUATIONS: Admitting BNP was 70. CPK was 46. CK-MB less than 0.5, glucose 145, BUN 11, creatinine 0.66, sodium 134, potassium 3.6, chloride was 102, CO2 was 29. WBC 8.3, hemoglobin 11.5, platelet count was 273. EKG normal sinus rhythm without any ST-T wave changes. PA and lateral chest x-ray showed cardiomegaly with vascular congestion with pulmonary edema. Magnesium was 1.5. Troponin remained less than 0.5. Ultrasound of the bilateral lower extremities was negative for DVT. Echocardiogram of the heart showed ejection fraction 50% to 55%, low normal global left ventricular systolic function and borderline concentric LVH. KUB showed diffuse gaseous distention of the large and small bowel without  for obstructive pattern. Repeat KUB on 11/08 shows no obstruction noted.   HOSPITAL COURSE: Please refer to H and P done by the admitting physician. The patient is a 54 year old with past medical history of diastolic congestive heart failure presented with shortness of breath, lower extremity swelling and erythema.  The patient was admitted for further evaluation and treatment. She was admitted and started on treatment with IV Lasix with significant improvement in her shortness of breath and swelling. The patient was subsequently switched over to oral Lasix. She also was complaining of bilateral lower extremity erythema and swelling. She was thought to have cellulitis. She was treated with antibiotics, that also started resolving. The patient was also complaining of some abdominal distention and nausea. She had x-ray which showed a possible ileus. She was treated with bowel regimen and with good results. She started having bowel movements and no more nausea or other symptoms. She is doing much better. She has been able to be weaned off oxygen and is stable for discharge to home. Discharge instructions for diastolic CHF given.   DISCHARGE MEDICATIONS: Methotrexate 25 mg 1 mL once a week on Monday. Abilify 5 daily, metformin 500 mg 1 tab p.o. b.i.d. levothyroxine 88 mcg daily. Trazodone 200 at bedtime, Flomax 0.4 daily, gabapentin 300; 1 tab p.o. t.i.d., acetaminophen and oxycodone 325/5 mg 1 tab p.o. t.i.d., folic acid 1 mg 1 tab p.o. b.i.d., KCl 20 mEq  t.i.d., Prozac 20; 1 tab 4 times a day, torsemide 20; 1 tabs p.o. t.i.d., Advair 250/50;  1 puff b.i.d., lovastatin 40 mg daily, Victoza 3 mL subcutaneous daily, Combivent 1 puff b.i.d. ketoconazole topically 2% topically affected area daily, prednisone 2 tabs daily as needed as previously, vitamin D3 at 1000 international units daily, metolazone 2.5 daily.   DIET: Low-sodium, low-fat.   ACTIVITY: As tolerated. Follow-up with primary M.D. in 1 to 2 weeks. The patient is to have a BMP checked at that time. Follow-up with Dr. Kirke Corin in 2  to 4 weeks. Follow with primary urology in 1 to 2 weeks. The patient should have BMP check at the time of her visit to her primary care provider due to low sodium noted after diuresis and low potassium.   TIME SPENT: 35 minutes spent.     ____________________________ Lacie Scotts. Allena Katz, MD shp:sg D: 02/26/2013 09:00:27 ET T: 02/26/2013 10:22:22 ET JOB#: [id number]  cc: Marzell Allemand H. Allena Katz, MD, <Dictator> Charise Carwin MD ELECTRONICALLY SIGNED 03/02/2013 16:50

## 2014-08-09 NOTE — Discharge Summary (Signed)
PATIENT NAME:  [name] [name], [name] [name] MR#:  [id number] DATE OF BIRTH:  1961/01/04  DATE OF ADMISSION:  10/13/2012 DATE OF DISCHARGE:  10/15/2012  PRESENTING COMPLAINT: Shortness of breath and leg swelling.   DISCHARGE DIAGNOSES:  1.  Acute on chronic diastolic heart failure.  2.  Morbid obesity.  3.  Type 2 diabetes.  4.  Sleep apnea.  5.  Rheumatoid arthritis, on steroids.  6.  Urinary tract infection.   CODE STATUS: Full code.   MEDICATIONS:  1.  Neurontin 300 mg 3 times a day.  2.  Methotrexate 25 mg per mL 1 mL injectable once a week on Mondays.  3.  Phenergan 25 mg orally 4 to 6 hours as needed for nausea and vomiting.  4.  Oxycodone 5/325 mg 1 to 2 q.6 hours p.r.n.  5.  Folic acid 2 mg daily.  6.  Prozac 20 mg 4 capsules, which is 80 mg daily.  7.  Potassium chloride 20 mEq p.o. daily.  8.  Abilify 5 mg daily.  9.  Metformin 500 mg b.i.d.  10. Levothyroxine 88 mcg p.o. daily.  11. Prednisone 5 mg daily.  12. Trazodone 100 mg 2 tablets at bedtime.  13. Glipizide 10 mg daily.  14. Torsemide 60 mg b.i.d.  15. Tamsulosin 0.4 mg capsule, 1 p.o. daily.  16. Gabapentin 300 mg 1 capsule 3 times a day.  17. Cipro 500 b.i.d.   DISCHARGE INSTRUCTGIONS: Resume home health. Follow up with Dr. Ulanda Edison PCP in 1 week. Dr. Mariah Milling in 1 to 2 weeks. The patient advised to get a sleep study done as outpatient.   STUDIES: Cardiac enzyme x3 including troponin negative. White count is 8.5, hemoglobin and hematocrit 10.9 and 32.6. BUN and creatinine is 14 and 0.81. Hemoglobin A1c is 9.8. TSH is 4.3. UA positive for UTI. Urine culture more than 100,000 gram-negative rods. Chest x-ray consistent with pulmonary interstitial edema secondary to congestive heart failure.   BRIEF SUMMARY OF HOSPITAL COURSE: [name] [name] is a 54 year old, morbidly obese, Caucasian female well-known to our service from previous admission. She came in with dyspnea on exertion, weight gain of about 9 to 10 pounds in 1 week and  was admitted with:  1.  Acute on chronic diastolic congestive heart failure. The patient received IV Lasix 80 mg b.i.d. along with 1 dose of IV metolazone. She had a urine output more than 7 L during the hospital stay. She was initially started on low-dose beta blockers; however, her blood pressure remained in the 110s, hence beta blocker was discontinued. She is followed by Dr. Mariah Milling as outpatient, who is her primary cardiologist. Cardiac enzymes remained negative.  2.  Elevated blood pressure without diagnosis of hypertension. Initially beta blockers were given; however, the patient's blood pressure remained on the lower side hence, I discontinued her beta blockers.  3.  Obstructive sleep apnea. The patient is currently not on CPAP. She is advised to get another sleep study through her primary care physician and then resume CPAP.  4.  Rheumatoid arthritis, on methotrexate and prednisone.  5.  DVT prophylaxis with subcutaneous heparin.  6.  Gram-negative UTI. The patient was placed on Cipro.  7.  Depression/anxiety. Her Abilify, trazodone and Prozac were continued.  8.  Hospital stay otherwise remained stable.   CODE STATUS: The patient remained a full code.   TIME SPENT: 40 minutes.  ____________________________ Wylie Hail Allena Katz, MD sap:aw D: 10/16/2012 07:25:39 ET T: 10/16/2012 07:57:45 ET JOB#: [id number]  cc: Haniya Fern  Ammie Dalton, MD, <Dictator> Antonieta Iba, MD Lucillie Garfinkel Mayford Knife, MD Willow Ora MD ELECTRONICALLY SIGNED 11/02/2012 7:31

## 2014-08-09 NOTE — Discharge Summary (Signed)
PATIENT NAME:  [name] [name], [name] [name] MR#:  [id number] DATE OF BIRTH:  1960/08/08  DATE OF ADMISSION:  08/30/2012  DATE OF DISCHARGE:  09/01/2012  DISCHARGE DIAGNOSES: 1.  Acute on chronic diastolic heart failure complicated by morbid obesity and untreated sleep apnea. Will benefit from Lasix. Echocardiogram showing ejection fraction of 60%. Negative cardiac enzymes.   2.  Possible chronic obstructive pulmonary disease, newly diagnosed on this admission, a long-time smoker for 27 years, 2-3 packs per day, improving on steroids likely. Also has a component of underlying sleep apnea due to morbid obesity. Will require outpatient followup with Pulmonary.   3.  Diabetes mellitus with hemoglobin A1c of 7.7. Sugars running high due to being on steroids. Recommend using twice a day metformin for the time being while she is on steroids, and if her sugars still stay high after stopping steroids, also if it is more than 200, she should continue twice a day metformin.  4.  Hypothyroidism, with increased TSH. Increase the dose of levothyroxine to 88 mcg.   5.  Untreated obstructive sleep apnea. Will recommend outpatient pulmonary followup.   SECONDARY DIAGNOSES:  1.  Obstructive sleep apnea, untreated.  2.  Hypertension. 3.  Obesity. 4.  Rheumatoid arthritis.  5.  Anxiety. 6.  Depression.  7.  Diastolic congestive heart failure,   CONSULTATION: Cardiology, Dr. Kirke Corin.   PROCEDURES/RADIOLOGY: Chest x-ray on May 15 showed findings consistent with low-grade CHF.  Chest x-ray on May 14 showed findings consistent with very mild CHF. No evidence of pneumonia.   MAJOR LABORATORY PANEL:  Urine on admission showed 2 WBCs, 1+ bacteria, positive nitrite, no leukocyte esterase.   HISTORY AND SHORT HOSPITAL COURSE:  The patient is a 54 year old female with the above-mentioned medical problems, who was admitted for acute on chronic diastolic heart failure. Please see Dr. Larose Hires History and Physical. The patient  was aggressively diuresed with IV Lasix. Cardiology consultation was obtained with Dr. Kirke Corin, who agreed with diuresis. She was diuresed fairly well over 48 hours, has been feeling much better. She is -6 liters and feeling close to her baseline. She is being discharged home in stable condition on May 16.  She is close to her baseline. On the date of discharge, her vital signs were as follows:  Temperature 97.9, heart rate 82 per minute, respirations 17 per minute, blood pressure 134/75 mmHg, saturating 92% on room air.   PERTINENT PHYSICAL EXAMINATION ON THE DATE OF DISCHARGE:  CARDIOVASCULAR:  S1, S2 normal. No murmurs, rubs or gallops.  LUNGS:  Clear to auscultation bilaterally. No wheezing, rales, or rhonchi on auscultation.    ABDOMEN:  Soft, benign.  NEUROLOGIC:  Nonfocal examination, All other exam at baseline.   DISCHARGE MEDICATIONS: 1.  Neurontin 300 mg p.o. 3 times a day. 2.  Methotrexate 25 mg/mL, 1 mL injectable once a week. 3.  Phenergan 25 mg p.o. every 4-6 hours as needed.  4.  Acetaminophen/oxycodone 1 tablet p.o. every 6 hours as needed.  5.  Trazodone 100 mg 2 tablets p.o. daily.  6.  Folic acid 1 mg 2 tablets p.o. daily. 7.  Prozac 80 mg a day.  8.  Potassium chloride 20 mEq p.o. daily. 9.  Abilify 5 mg p.o. daily. 10.  Prednisone 50 mg p.o. daily, taper 10 mg daily until finished.  11. Metformin 500 mg p.o. b.i.d. while she is on steroids, then she can go back to once a day unless her sugars are more than 200, she may want to  continue twice a day dose.  12.  Lasix 40 mg 1 tablets p.o. b.i.d.  13.  Levothyroxine 88 mcg p.o. daily. 14.  Advair 250/50, one puff b.i.d.  15.  Spiriva once daily.  16.  Combivent 1 puff inhaled 4 times a day.  DISCHARGE DIET:   Low sodium, low fat, low cholesterol  DISCHARGE ACTIVITY:  As tolerated.   DISCHARGE INSTRUCTIONS AND FOLLOWUP:  The patient was instructed to follow up with her primary care physician, Dr. [facility]ford [facility] at [facility]   in 1 to 2 weeks. She will need follow up with Dr. Kirke Corin from Cardiology  in 2 to 4 weeks. She will need follow up with [facility], Dr. Kendrick Fries, in 4 to 6 weeks.    Total time discharging this patient was 55 minutes.   ____________________________ Ellamae Sia. Sherryll Burger, MD vss:mr D: 09/01/2012 14:01:00 ET T: 09/01/2012 21:02:25 ET JOB#: [id number]  cc: Marni Franzoni S. Sherryll Burger, MD, <Dictator> Muhammad A. Kirke Corin, MD Lupita Leash, MD   Ellamae Sia [facility] MD ELECTRONICALLY SIGNED 09/04/2012 8:54

## 2014-08-09 NOTE — Discharge Summary (Signed)
PATIENT NAME:  [name] [name], [name] [name] MR#:  [id number] DATE OF BIRTH:  30-Sep-1960  DATE OF ADMISSION:  09/19/2012 DATE OF DISCHARGE:  09/24/2012  ADMITTING DIAGNOSES: Shortness of breath; lower extremity swelling.   DISCHARGE DIAGNOSES: 1.  Shortness of breath, lower extremity swelling due to acute-on-chronic diastolic congestive heart failure.  2.  Likely obstructive sleep apnea; needs completion of her outpatient sleep study.  3.  Lower extremity cellulitis, status post treatment with IV antibiotics, with improvement in the erythema.  4.  Electrolyte imbalances, improved with treatment.  5.  Hypertension: Blood pressure is currently controlled.  6.  Depression and anxiety: Is under control.  7.  History of urinary retention, with a bladder stimulator in place. She requests to keep her  Foley in place. She will need to follow up with Dr. Achilles Dunk as an outpatient.  8.  Severe morbid obesity.  9.  Rheumatoid arthritis.  10.  Status post bladder pacemaker.  11.  Status post hiatal hernia repair surgery.  12. Status post gastric band surgery.   PERTINENT LABS AND EVALUATIONS: Admitting blood glucose 298, BUN 12, creatinine 0.77.   Sodium was 136, potassium was 3.4, chloride was 103, CO2 was 26. Calcium is 8.4. BNP was 103. WBC count 5.5, hemoglobin 11.3, platelet count 202.   PA and lateral chest x-ray shows findings suggestive of low-grade CHF.   EKG: Normal sinus rhythm.   Troponin less than 0.02.   Fasting lipid panel: Total cholesterol 156, triglycerides 210, HDL was 32, LDL was 82, magnesium was 1.6.   HOSPITAL COURSE: Please refer to H and P done by the admitting physician. The patient is a 54 year old morbidly obese female with a history of rheumatoid arthritis, morbid obesity, diastolic CHF, hypothyroidism, multiple admissions in the past few months with similar complaints of shortness of breath, lower extremity swelling. The patient was evaluated for these symptoms and was thought to have  acute diastolic CHF. She was admitted and treated with IV Lasix, with significant urine output and much improvement in her shortness of breath. The patient's breathing is much-improved. She is currently on room air. Also her swelling is much-improved. The patient also was noted to have bilateral lower extremity erythema and was thought to have possible cellulitis. She was treated with IV ceftriaxone, with resolution of her lower extremity swelling. The patient also has an incomplete sleep study that needs to be done. She was seen by Dr. Meredeth Ide during her hospitalization. At this time she is stable for discharge. Discharge instructions for CHF given.   DISCHARGE MEDICATIONS: Neurontin 300 mg 1 tablet p.o. t.i.d., methotrexate 1 mL once a week on Monday, Phenergan 25 q.4 to 6 p.r.n., acetaminophen/oxycodone 325/5 mg q.6 p.r.n. for pain, folic acid 200 mg daily, Prozac 80 daily, KCL 20 mEq 1 tablet p.o. daily, Abilify 5 p.o. daily, metformin 500 mg 1 tablet p.o. b.i.d., levothyroxine 88 mcg daily, Advair 250/50 1 puff b.i.d., Spiriva 18 mcg daily, Combivent 1 puff 4 times a day, Lasix 40 mg 1 tablet p.o. b.i.d.,  prednisone 5 daily, trazodone 100, 2 tablets at bedtime, glipizide 10 mg 1 tablet p.o. daily, Ceftin 500 mg 1 tablet p.o. b.i.d.   DIET: Low-sodium, low-fat, low-cholesterol, carbohydrate-controlled diet.   ACTIVITY: As tolerated.   Follow with primary MD in 1 to 2 weeks.   Follow with Dr. Achilles Dunk for urinary retention.   The patient is to finish her sleep study. The patient also has a home health referral with physical therapy and nurse aide. Also  prescribed a bedside commode.    Note: 35 minutes spent on the discharge.      ____________________________ Lacie Scotts. Allena Katz, MD shp:dm D: 09/24/2012 12:52:06 ET T: 09/24/2012 15:21:38 ET JOB#: [id number]  cc: Chace Klippel H. Allena Katz, MD, <Dictator> Charise Carwin MD ELECTRONICALLY SIGNED 09/30/2012 15:22

## 2014-08-09 NOTE — Consult Note (Signed)
General Aspect Reason for consultation:  Diastolic HF   Present Illness The patient presents for evauation of a 9 lb weight gain over one week.  We saw her less than one month ago in the hospital for similar complaints.  She had IV diuresis in the hospital and was sent home.  Unfortunately, she has had continued weight gain despite, by her report, being compliant with daily weights, fluid and salt restriction.  She does take extra demadex if her weight is increasing.  However, this did not seem to make a difference over the last week. She believes that she took extra diuretic about three times.  With the weight gain she starts to get SOB.  She has increased DOE.  She swells "all over".  She chronically sleeps in an easy chair and has no new PND or orthopnea.  Of note the Pro BNP was only slightly elevated.  There was some mild edema on CXR.   FH:  Mother with MI in 15s,  brother with stent age 64  SOCIAL: Divorced, quit tobacco 2001, 2 children   Physical Exam:  GEN well developed, obese   HEENT PERRL, moist oral mucosa   NECK No masses   RESP normal resp effort  clear BS  no use of accessory muscles   CARD Regular rate and rhythm  Normal, S1, S2  No murmur   ABD denies tenderness  normal BS  no Abdominal Bruits   LYMPH negative neck   EXTR negative cyanosis/clubbing, positive edema, chronic venous stasis, mild edema   SKIN normal to palpation   NEURO cranial nerves intact, motor/sensory function intact   PSYCH alert, A+O to time, place, person   Review of Systems:  Subjective/Chief Complaint As stated in the HPI   Review of Systems: All other systems were reviewed and found to be negative   Medications/Allergies Reviewed Medications/Allergies reviewed     CHF:    Depression:    Anxiety:    Hypertension:    Osteoarthritis:    Restless Leg Syndrome:    Recurrent UTI/Cystitis:    Cholecystectomy:    Tonsillectomy and Adenoidectomy:    Tubal Ligation:     Hysterectomy - Partial with RSO \{Menometorrhagia\}:    Urinary Bladder Pacemaker for Incontinence:    Hernia Repair; Ventral x 4:    Gastric Lap Band due to Morbid Obesity:   Home Medications: Medication Instructions Status  glipiZIDE 10 mg oral tablet 1 tab(s) orally once a day Active  potassium chloride 20 mEq oral tablet, extended release 1 tab(s) orally once a day Active  Abilify 5 mg oral tablet 1 tab(s) orally once a day Active  metformin 500 mg oral tablet 1 tab(s) orally 2 times a day Active  Neurontin 300 mg oral capsule 1 cap(s) orally 3 times a day Active  methotrexate 25 mg/mL injectable solution 1 milliliter(s) injectable once a week on Monday Active  Phenergan 1 tab (25 milligrams) orally every 4 to 6 hours as needed for nausea, vomiting Active  acetaminophen-oxycodone 325 mg-5 mg oral tablet 1 tab(s) orally every 6 hours as needed for pain Active  folic acid 1 mg oral tablet 2 tabs (1 mg) orally once a day Active  Prozac 20 mg oral capsule 4 caps (80 mg) orally once a day Active  torsemide 60 milligram(s) orally 2 times a day Active  tamsulosin 0.4 mg oral capsule 1 cap(s) orally once a day Active  levothyroxine 88 mcg (0.088 mg) oral tablet 1 tab(s) orally once  a day Active  predniSONE 5 mg oral tablet 1 tab(s) orally once a day Active  trazodone 100 mg oral tablet 2 tab(s) orally once a day (at bedtime) Active   Lab Results: Hepatic:  27-Jun-14 15:18   Bilirubin, Total 0.2  Alkaline Phosphatase 96  SGPT (ALT) 36  SGOT (AST) 27  Total Protein, Serum 7.4  Albumin, Serum  3.0  Routine Chem:  27-Jun-14 15:18   Glucose, Serum  145  BUN 15  Creatinine (comp) 0.68  Sodium, Serum 136  Potassium, Serum  3.3  Chloride, Serum 100  CO2, Serum 29  Calcium (Total), Serum 9.0  Anion Gap 7  Osmolality (calc) 275  eGFR (African American) >60  eGFR (Non-African American) >60 (eGFR values <36m/min/1.73 m2 may be an indication of chronic kidney disease  (CKD). Calculated eGFR is useful in patients with stable renal function. The eGFR calculation will not be reliable in acutely ill patients when serum creatinine is changing rapidly. It is not useful in  patients on dialysis. The eGFR calculation may not be applicable to patients at the low and high extremes of body sizes, pregnant women, and vegetarians.)  B-Type Natriuretic Peptide ([facility])  244 (Result(s) reported on 13 Oct 2012 at 10:17PM.)  Cardiac:  27-Jun-14 15:18   Troponin I < 0.02 (0.00-0.05 0.05 ng/mL or less: NEGATIVE  Repeat testing in 3-6 hrs  if clinically indicated. >0.05 ng/mL: POTENTIAL  MYOCARDIAL INJURY. Repeat  testing in 3-6 hrs if  clinically indicated. NOTE: An increase or decrease  of 30% or more on serial  testing suggests a  clinically important change)  Routine UA:  27-Jun-14 23:10   Color (UA) Yellow  Clarity (UA) Clear  Glucose (UA) Negative  Bilirubin (UA) Negative  Ketones (UA) Negative  Specific Gravity (UA) 1.010  Blood (UA) Negative  pH (UA) 6.0  Protein (UA) Negative  Nitrite (UA) Positive  Leukocyte Esterase (UA) 2+ (Result(s) reported on 13 Oct 2012 at 11:41PM.)  RBC (UA) 3 /HPF  WBC (UA) 25 /HPF  Bacteria (UA) 3+  Epithelial Cells (UA) <1 /HPF  Mucous (UA) PRESENT  Hyaline Cast (UA) 3 /LPF (Result(s) reported on 13 Oct 2012 at 11:41PM.)  Routine Hem:  27-Jun-14 15:18   WBC (CBC) 8.4  RBC (CBC) 4.20  Hemoglobin (CBC)  11.4  Hematocrit (CBC)  34.0  Platelet Count (CBC) 322 (Result(s) reported on 13 Oct 2012 at 03:59PM.)  MCV 81  MCH 27.2  MCHC 33.5  RDW  16.8   EKG:  EKG Interp. by me   Interpretation NSR, rate 79, axis WNL, no acute ST T wave changes.   Radiology Results: XRay:    27-Jun-14 15:19, Chest PA and Lateral  Chest PA and Lateral   REASON FOR EXAM:    Chest Pain  COMMENTS:       PROCEDURE: DXR - DXR CHEST PA (OR AP) AND LATERAL  - Oct 13 2012  3:19PM     RESULT: Comparison is made to the study September 19, 2012.    The lungs are adequately inflated. The interstitial markings are more   prominent today than in the past. The cardiac silhouette appears mildly   enlarged as compared to the previous study. The pulmonary vascularity is   engorged. There is no pleural effusion.    IMPRESSION:  The findings are consistent with mild pulmonary interstitial   edema secondary to CHF. There is no focal pneumonia.   Dictation Site: 1        Verified  By: DAVID A. Martinique, M.D., MD    Darvocet - N: SOB, Other  Codeine: Other  Sulfa drugs: Other  Hydrocodone/ APAP: Other   Impression DIASTOLIC HF OBESITY HTN DYSPNEA   Plan Diastolic HF:  I agree with current plans for current diuresis.  Zaroxolyn has also been ordered.  Ultimately this will be a difficult patient to help to stay out of the hospital without close follow up in a transition of care clinic.  Perhaps a [facility] consult would help.  At home I would continue the [facility] but add PRN zaroxolyn as well.  She reports compliance with fluid and salt restriction  Obesity:  She is considering another gastric surgery.  I would encourage this.  HTN:  Continue current therapy.  Untreated sleep apnea is probably contributing to this.    DYSPNEA:  This is likely multifactorial.  Continue treatment as above.   Electronic Signatures: Minus Breeding (MD)  (Signed 28-Jun-14 14:49)  Authored: General Aspect/Present Illness, History and Physical Exam, Review of System, Past Medical History, Home Medications, Labs, EKG , Radiology, Allergies, Impression/Plan   Last Updated: 28-Jun-14 14:49 by Minus Breeding (MD)

## 2014-08-09 NOTE — H&P (Signed)
PATIENT NAME:  [name] [name], [name] [name] MR#:  [id number] DATE OF BIRTH:  [birth year]  DATE OF ADMISSION:  08/30/2012  ER REFERRING PHYSICIAN: Bayard Males, MD PRIMARY CARE PHYSICIAN: Dr[facility] Mayford Knife at [facility]   CHIEF COMPLAINT: Shortness of breath.   HISTORY OF PRESENT ILLNESS: This is a 54 year old female with past medical history of rheumatoid arthritis, morbid obesity, diastolic CHF, hypothyroidism, presented to ER with complaint of worsening shortness of breath for the last 1 week to 4 days. She was admitted to the hospital 2 months ago and discharged with diagnosis of diastolic CHF with oral Lasix 40 mg daily. She says that she was feeling okay, but for the last 1 week she started feeling worse, and her shortness of breath was getting worse. She was getting excessive shortness of breath with minimal exertion, and even while talking she was short of breath; so she increased her Lasix dose from 40 mg daily to 40 mg twice daily on her own, but that did not help as her urine output remained the same even after increasing the Lasix, and so finally she decided to come to the Emergency Room. On further questioning, she denies any fever or cough. She says that she has been feeling orthopnea symptoms, but those are since the since the last 2 years, and her legs are always swollen.  She denied any palpitations but has some chest pain in her back between the 2 scapulae and which is getting worse with deep breath.   REVIEW OF SYSTEMS:  CONSTITUTIONAL: Negative for fever, fatigue, weakness or weight loss.  EYES: No blurring, double vision, discharge.  EARS, NOSE, THROAT: No tinnitus, ear pain or hearing loss.  RESPIRATORY: No cough, wheezing but has shortness of breath.  CARDIOVASCULAR: There was some chest pain but no orthopnea, palpitations.  GASTROINTESTINAL: No nausea, vomiting, diarrhea,  GENITOURINARY: No increased frequency of the urination.  ENDOCRINE: No heat or cold intolerance.  SKIN: No acne or  rashes.  MUSCULOSKELETAL: No pain or swelling in the joints.  NEUROLOGICAL: No numbness, weakness, tremors.  PSYCHIATRIC: No anxiety, insomnia or bipolar disorder   PAST MEDICAL HISTORY: Diastolic CHF, obstructive sleep apnea untreated, hypertension, obesity, rheumatoid arthritis, anxiety and depression.   PAST FAMILY HISTORY: Congestive heart failure in her father.  No premature coronary artery disease in the family.   PAST SURGICAL HISTORY: Bladder, pacemaker and hiatus hernia repair surgery in the past.   SOCIAL HISTORY: She was a smoker for 27 years, 2 packs per day, but quit smoking in 2002. No alcohol use, no illicit drug use. Lives independently and able to ambulate without any support.   HOME MEDICATIONS:  Trazodone 100 mg oral tablet once a day, Prozac 20 mg oral capsule 4 capsules once a day, prednisone 5 mg oral tablet once a day, potassium chloride 20 mEq once a day, Neurontin 300 mg oral capsule 3 times a day, methotrexate 25 mg/mL injectable solution 1 mL injectable once a week, metformin 500 mg oral tablet once a day, levothyroxine 75 mcg orally once a day, furosemide 40 mg oral tablet 2 tablets orally once a day, folic acid 1 mg take 2 tablets once a day, Abilify 5 mg oral tablet once a day.   PHYSICAL EXAMINATION:  VITAL SIGNS: Temperature 98.5, pulse rate 86, respirations 18, blood pressure 135/82 and pulse ox 99 on room air.  GENERAL: Morbidly obese female, fully alert and oriented and in no acute distress, cooperative with history taking and physical examination.  HEENT: Head and neck  atraumatic. Conjunctivae pink. Oral mucosa moist. Hearing intact. NECK: Supple. No JVD.   RESPIRATORY: Good respiratory effort, mild crepitations present bilateral, symmetric rise of the chest with each breath.  No wheezing.  CARDIOVASCULAR: Regular rate and rhythm. No murmur, bilateral leg edema present.  GASTROINTESTINAL: No tenderness, morbid obesity. No mass felt. Bowel sounds present.   GENITOURINARY: Deferred.  MUSCULOSKELETAL: No swelling or tenderness in the joints.  EXTREMITIES: Legs: Bilateral leg swelling, pitting edema. Chronic lymphatic swelling changes.  SKIN: No rashes.  NEUROLOGICAL: Cranial nerves grossly intact. Power 5 out of 5 in all 4 limbs. Follows commands. No tremors.  PSYCHIATRIC: Does not appear in any acute psychiatric illness at this time.  LABORATORY AND RADIOLOGICAL DATA: Glucose 142, BNP is 86, BUN 15, creatinine 0.64, sodium 136, potassium 3.7, CO2 26. Troponin less than 0.02. WBC 8.6, hemoglobin 11.7, platelet count 250, MCV 80.   Chest x-ray, PA and lateral:  Findings are consistent with mild CHF. No evidence of pneumonia.   ASSESSMENT: A 54 year old female with past medical history of diastolic congestive heart failure, undiagnosed obstructive sleep apnea and possible chronic obstructive pulmonary disease,  as she was a chronic smoker and never diagnosed, borderline diabetes mellitus, morbid obesity, anxiety and depression, came with shortness of breath.   1.  Shortness of breath: Might be due to acute diastolic heart failure, or it might be due to obstructive sleep apnea.  I would like to get cardiology consult this time as this is the second admission for similar complaint and will give IV Lasix 40 mg b.i.d.  2.  Possible chronic obstructive pulmonary disease, never diagnosed:  The patient was a smoker for 27 years, 2 packs per day. We will give her IV steroid and nebulizer treatment for bronchodilators and will wait for the response. There might be also a component of obstructive sleep apnea due to morbid obesity. I would like her to be advised on discharge to follow in pulmonary clinic for further workup.  3.  Borderline diabetes with family history of diabetes:  She was prescribed metformin 500 mg daily. We will check her HbA1c, continue metformin, and we will give her insulin sliding scale coverage while in the hospital  4.  Anxiety and  depression:  She was on Abilify. We will continue that.  5.  Possible hypothyroidism: The patient is not aware about this, but in her medication list levothyroxine is there, and her TSH level does not appear to be tested in the hospital, so I will check  her TSH level and will decide further management.   CODE STATUS:  FULL CODE.  Healthcare power of attorney is her son, Welford Roche.    TOTAL TIME SPENT IN THIS ADMISSION: 50 minutes.   ____________________________ Hope Pigeon Elisabeth Pigeon, MD vgv:cb D: 08/30/2012 22:45:27 ET T: 08/30/2012 23:05:34 ET JOB#: [id number]  cc: Hope Pigeon. Elisabeth Pigeon, MD, <Dictator> Altamese Dilling MD ELECTRONICALLY SIGNED 08/31/2012 6:23

## 2014-08-09 NOTE — Consult Note (Signed)
Brief Consult Note: Diagnosis: Acute on chronic diastolic heart failure, morbid obesity, untreated sleep apnea.   Patient was seen by consultant.   Consult note dictated.   Comments: See dictated note. Continue IV Lasix today.  Electronic Signatures: Lorine Bears (MD)  (Signed 15-May-14 09:10)  Authored: Brief Consult Note   Last Updated: 15-May-14 09:10 by Lorine Bears (MD)

## 2014-08-09 NOTE — H&P (Signed)
PATIENT NAME:  [name] [name], [name] [name] MR#:  [id number] DATE OF BIRTH:  [birth year]  DATE OF ADMISSION:  02/21/2013  REFERRING PHYSICIAN: Dr. Jene Every.    PRIMARY CARDIOLOGIST: Dr. Lorine Bears.   PRIMARY CARE PHYSICIAN: Dr. Ulanda Edison.   CHIEF COMPLAINT: This is a 54 year old female with known past medical history of diastolic CHF. The patient presents with complaints of shortness of breath. Reports she contacted her cardiologist's office who recommended her to come to the ED for further evaluation. The patient reports she had 10-pound weight gain over the last 4 days. As well, reports shortness of breath. Reports she is currently sleeping in a recliner with more elevated position. As well, reports worsening lower extremity edema over the last week. The patient reports she has been compliant with her medications, with her diet, with her restrictive salt diet. As well, reports she has been taking extra diuresis, including extra dose of metolazone over the last week as instructed as well. The patient's chest x-ray does show cardiomegaly with vascular congestion versus early pulmonary edema. The patient denies any chest pain, any vomiting, nausea, diarrhea, abdominal pain, fever, chills, cough or productive sputum. The patient is known to have a history of obstructive sleep apnea. She has been on CPAP for the last 2 months. The patient's BNP was within normal limits. The patient's last echo was done in March of this year which showed a normal ejection fraction. The patient received IV Lasix 40 in the ED with significant improvement of her shortness of breath. Hospitalist service was requested to admit the patient for further treatment of her CHF exacerbation.   PAST MEDICAL HISTORY:  1. Diastolic congestive heart failure.  2. Obesity.  3. Obstructive sleep apnea, on CPAP.  4. Rheumatoid arthritis.  5. Hypothyroidism.  6. Anxiety and depression.   PAST SURGICAL HISTORY:  1. Bladder surgery.  2.  Pacemaker.  3. Hiatal hernia repair.  4. Gastric band surgery.   PSYCHOSOCIAL HISTORY: The patient lives at home. No alcohol. No illicit drug use. The patient quit smoking in 2003.   FAMILY HISTORY: Her mother had MI in her 82s. Brother had a stent at age of 16.   ALLERGIES:  1. CODEINE.  2. DARVOCET.  3. HYDROCODONE WITH TYLENOL.  4. SULFA DRUGS.   HOME MEDICATIONS:  1. Methotrexate injection weekly.  2. Metolazone 2.5 mg 3 times a week.  3. Lovastatin 40 mg daily.  4. Victoza 18 mg injection daily.  5. Folic acid 1 mg 2 times a day.  6. Metformin 500 mg 2 times a day.  7. Vitamin D3 1000 mcg daily.  8. Tamsulosin 0.4 mg daily.  9. Abilify 5 mg oral daily.  10. Potassium 20 mEq 3 tablets a day.  11. Gabapentin 300 mg 3 times daily.  12. Prednisone as needed 10 mg daily but she is not on this regularly. She has not been using it for a while.  13. Trazodone 200 mg at night.  14. Percocet 5/325 three times a day as needed.  15. Phenergan 25 mg 3 times a day as needed.  16. Prozac 80 mg daily.  17. Torsemide 60 mg p.o. b.i.d.  18. Levothyroxine 88 mcg oral daily.  19. Ketoconazole cream.  20. Combivent inhaler 2 times a day.  21. Advair inhaler 2 times a day.   REVIEW OF SYSTEMS:  CONSTITUTIONAL: The patient denies fever, chills. Complains of fatigue, weakness, weight gain.  EYES: Denies blurry vision, double vision, inflammation.  ENT: Denies tinnitus,  ear pain, epistaxis or discharge.  RESPIRATORY: Denies cough, wheezing, hemoptysis, painful respiratory or COPD. Complains of dyspnea.  CARDIOVASCULAR: Denies chest pain, arrhythmia, palpitation, syncope. Complains of edema.  GASTROINTESTINAL: Denies nausea, vomiting, diarrhea, abdominal pain, hematemesis, jaundice.  GENITOURINARY: Denies dysuria, hematuria, renal colic.  ENDOCRINE: Denies polyuria, polydipsia, heat or cold intolerance.  HEMATOLOGY: Denies anemia, easy bruising, bleeding diathesis.  INTEGUMENTARY: Denies  acne, rash. Has lower extremity erythema and tenderness.  MUSCULOSKELETAL: Denies any gout, cramps. Has history of rheumatoid arthritis.  NEUROLOGIC: Denies CVA, TIA, headache, ataxia, vertigo.  PSYCHIATRIC: Reports has history of anxiety and depression. Denies any schizophrenia, substance abuse, alcohol abuse.   PHYSICAL EXAMINATION:  VITAL SIGNS: Temperature 98.2, pulse 81, respiratory rate 20, blood pressure 131/68, saturating 95% on room air.  GENERAL: Morbidly obese female, looks comfortable in bed, in no apparent distress.  HEENT: Head atraumatic, normocephalic. Pupils equal, reactive to light. Pink conjunctivae. Anicteric sclerae. Moist oral mucosa.  NECK: Supple. No thyromegaly. No JVD. No carotid bruits.  CHEST: Good air entry bilaterally. No wheezing, rales. Has rhonchi in the bases.  CARDIOVASCULAR: S1, S2 heard. No rubs, murmurs or gallops.  ABDOMEN: Obese, soft, nontender, nondistended. Bowel sounds present in all quadrants.  EXTREMITIES: Has +2 to 3 pitting edema bilaterally. No clubbing. No cyanosis. Dorsalis pedis pulse felt bilaterally. Has bilateral lower extremity erythema and tenderness to palpation. The patient reports it has worsened recently.  MUSCULOSKELETAL: No joint effusion, tenderness or erythema.  LYMPHATIC: No cervical or supraclavicular lymphadenopathy.  NEUROLOGIC: Cranial nerves grossly intact. Motor 5 out of 5. No focal deficit.  PSYCHIATRIC: Appropriate affect. Awake, alert x 3. Intact judgment and insight.   PERTINENT LABORATORIES: Glucose 145, BNP 70, BUN 11, creatinine 0.66, sodium 134, potassium 3.6, chloride 102, CO2 29. Troponin less than 0.02. White blood cell 8.3, hemoglobin 11.5, hematocrit 34.8, platelets, platelets 273.   IMAGING: Chest x-ray, PA and lateral, showing cardiomegaly with vascular congestion versus early edema.   ASSESSMENT AND PLAN:  1. Acute diastolic congestive heart failure: The patient will be started on intravenous Lasix 60 mg  every 4 hours. The patient had recent echocardiogram so no need to repeat. She will be admitted to the telemetry unit. Will continue to cycle her cardiac enzymes. Will consult cardiology service, Dr. Mariah Milling who is familiar with the patient. As well, the patient will be continued on metolazone every other day.  2. Cellulitis: The patient appears to be having bilateral lower extremity cellulitis. She will be started on Rocephin.  3. Obstructive sleep apnea: Will continue the patient on CPAP.  4. Rheumatoid arthritis: The patient can be continued with her methotrexate as an outpatient.  5. Hypothyroidism: Continue with Synthroid.  6. Anxiety and depression: Continue with home medications.  7. Deep vein thrombosis prophylaxis: Subcutaneous heparin.   CODE STATUS: The patient is FULL CODE.   TOTAL TIME SPENT ON ADMISSION AND PATIENT CARE: 55 minutes.   ____________________________ Starleen Arms, MD dse:gb D: 02/21/2013 23:59:36 ET T: 02/22/2013 00:24:19 ET JOB#: [id number]  cc: Starleen Arms, MD, <Dictator> Zaylynn Rickett Teena Irani MD ELECTRONICALLY SIGNED 02/23/2013 4:08

## 2014-08-09 NOTE — Consult Note (Signed)
General Aspect [name] [name] is a 54 year old female w/ PMHx s/f HFpEF, COPD, OHS/OSA (intolerant of CPAP?), morbid obesity, h/o tobacco abuse, RA (on MTX & chronic steroids PRN), steroid-induced DM2, hypothyroidism, chronic urinary retention (s/p bladder pacemaker), chronic venous insufficiency and anxiety who was admitted to [facility] yesterday for A/C diastolic CHF and cellulitis.   She had an echocardiogram done which showed normal LV systolic function, with no evidence of valvular abnormalities and normal pulmonary pressure. She has difficulty urinating due to a bladder disorder. She was evaluated for pulmonary embolism during her admission in March, which was negative.   Present Illness She has had multiple admissions this year for decompensated CHF. She reports experiencing a 5 lbs weight gain with increased LE edema, redness and tenderness for the past 2-3 days. She does note swelling in her arms, neck and face as well. She has reduced salt and fluid intake substantially. She takes Lasix as prescribed, and notes she has been taking three tablets more frequently for weight gain and edema. She does take prednisone only as needed for joint pain, and took two tablets during this time frame. She has chronic 2-3 pillow orthopnea, PND. She denies chest pain, palpitations, uncontrolled BP, n/v/d/f or chills. She has chronic urinary retention since a young age prior to medication use. She has a 54 pack-year history- quit in 2003. She called her PCP with these complaints and was advised to presented to the ED.   In the ED, EKG revealed NSR, no evidence of ischemia. Trop-I WNL x 2. TSH WNL. BNP normal. CXR with mild intersitial edema; however unchanged from prior tracings.   PAST MEDICAL HISTORY: Diastolic CHF, obstructive sleep apnea, morbid obesity, hypertension, rheumatoid arthritis, anxiety and depression.   FAMILY HISTORY: Congestive heart failure in father.   PAST SURGICAL HISTORY: Bladder pacemaker, hiatus  hernia repair surgery in the past and gastric band surgery.   SOCIAL HISTORY: She was a smoker 27 years 2 packs per day, quit smoking in 2003. No alcohol. No illicit drug use. Lives independently and able to ambulate without any support.   Physical Exam:  GEN no acute distress, obese   HEENT pink conjunctivae, PERRL, hearing intact to voice   NECK supple  No masses  trachea midline   RESP normal resp effort  clear BS  no use of accessory muscles   CARD Regular rate and rhythm  Normal, S1, S2  No murmur   ABD denies tenderness  hypoactive BS  mildly distended   EXTR Trace to 1+ bilateral pretibial edema, bilateral localized erythematous rashes   SKIN tight to palpation, of the LE   NEURO follows commands, motor/sensory function intact   PSYCH alert, A+O to time, place, person   Review of Systems:  Subjective/Chief Complaint edema, leg pain/redness, weight gain   General: Weight loss or gain   Skin: No Complaints    ENT: No Complaints    Eyes: No Complaints    Neck: No Complaints    Respiratory: chronic SOB   Cardiovascular: Dyspnea  Orthopnea  Edema   Gastrointestinal: No Complaints    Genitourinary: incomplete voiding   Vascular: No Complaints    Musculoskeletal: Muscle or joint pain  Muscle or Joint Stiffness  Muscle or joint swelling   Neurologic: No Complaints    Hematologic: No Complaints    Endocrine: No Complaints    Psychiatric: No Complaints  Anxiety   Review of Systems: All other systems were reviewed and found to be negative  Medications/Allergies Reviewed Medications/Allergies reviewed        CHF:    Depression:    Anxiety:    Hypertension:    Osteoarthritis:    Restless Leg Syndrome:    Recurrent UTI/Cystitis:    Cholecystectomy:    Tonsillectomy and Adenoidectomy:    Tubal Ligation:    Hysterectomy - Partial with RSO \{Menometorrhagia\}:    Urinary Bladder Pacemaker for Incontinence:    Hernia Repair; Ventral x  4:    Gastric Lap Band due to Morbid Obesity:          Admit Diagnosis:   CHF: Onset Date: 20-Sep-2012, Status: Active, Description: CHF  Home Medications: Medication Instructions Status  potassium chloride 20 mEq oral tablet, extended release 1 tab(s) orally once a day Active  Abilify 5 mg oral tablet 1 tab(s) orally once a day Active  metformin 500 mg oral tablet 1 tab(s) orally 2 times a day Active  Neurontin 300 mg oral capsule 1 cap(s) orally 3 times a day Active  methotrexate 25 mg/mL injectable solution 1 milliliter(s) injectable once a week on Monday Active  Phenergan 1 tab (25 milligrams) orally every 4 to 6 hours as needed for nausea, vomiting Active  acetaminophen-oxycodone 325 mg-5 mg oral tablet 1 tab(s) orally every 6 hours as needed for pain Active  folic acid 1 mg oral tablet 2 tabs (1 mg) orally once a day Active  Prozac 20 mg oral capsule 4 caps (80 mg) orally once a day Active  levothyroxine 88 mcg (0.088 mg) oral tablet 1 tab(s) orally once a day Active  Advair Diskus 250 mcg-50 mcg inhalation powder 1 puff(s) inhaled 2 times a day Active  Spiriva 18 mcg inhalation capsule 1 each inhaled once a day Active  Combivent Respimat CFC free 100 mcg-20 mcg/inh inhalation aerosol 1 puff(s) inhaled 4 times a day Active  furosemide 40 mg oral tablet 1 tab(s) orally 2 times a day Active  predniSONE 5 mg oral tablet 1 tab(s) orally once a day Active  trazodone 100 mg oral tablet 2 tab(s) orally once a day (at bedtime) Active   Lab Results:  Thyroid:  04-Jun-14 04:46   Thyroid Stimulating Hormone 3.68 (0.45-4.50 (International Unit)  ----------------------- Pregnant patients have  different reference  ranges for TSH:  - - - - - - - - - -  Pregnant, first trimetser:  0.36 - 2.50 uIU/mL)  Routine Chem:  04-Jun-14 04:46   Glucose, Serum  236  BUN 10  Creatinine (comp) 0.66  Sodium, Serum 137  Potassium, Serum  3.4  Chloride, Serum 101  CO2, Serum 30  Calcium  (Total), Serum  8.2  Anion Gap  6  Osmolality (calc) 281  eGFR (African American) >60  eGFR (Non-African American) >60 (eGFR values <33m/min/1.73 m2 may be an indication of chronic kidney disease (CKD). Calculated eGFR is useful in patients with stable renal function. The eGFR calculation will not be reliable in acutely ill patients when serum creatinine is changing rapidly. It is not useful in  patients on dialysis. The eGFR calculation may not be applicable to patients at the low and high extremes of body sizes, pregnant women, and vegetarians.)  Magnesium, Serum  1.6 (1.8-2.4 THERAPEUTIC RANGE: 4-7 mg/dL TOXIC: > 10 mg/dL  -----------------------)  Cholesterol, Serum 156  Triglycerides, Serum  210  HDL (INHOUSE)  32  VLDL Cholesterol Calculated  42  LDL Cholesterol Calculated 82 (Result(s) reported on 20 Sep 2012 at 06:52AM.)  Cardiac:  04-Jun-14 04:46   Troponin  I < 0.02 (0.00-0.05 0.05 ng/mL or less: NEGATIVE  Repeat testing in 3-6 hrs  if clinically indicated. >0.05 ng/mL: POTENTIAL  MYOCARDIAL INJURY. Repeat  testing in 3-6 hrs if  clinically indicated. NOTE: An increase or decrease  of 30% or more on serial  testing suggests a  clinically important change)  Routine Hem:  04-Jun-14 04:46   WBC (CBC) 6.2  RBC (CBC) 4.19  Hemoglobin (CBC)  11.3  Hematocrit (CBC)  33.7  Platelet Count (CBC) 210  MCV 80  MCH 27.0  MCHC 33.6  RDW  16.6  Neutrophil % 73.6  Lymphocyte % 20.3  Monocyte % 5.5  Eosinophil % 0.1  Basophil % 0.5  Neutrophil # 4.6  Lymphocyte # 1.3  Monocyte # 0.3  Eosinophil # 0.0  Basophil # 0.0 (Result(s) reported on 20 Sep 2012 at [facility].)   EKG:  Interpretation EKG shows NSR, incomplete RBBB, IVCD III, aVF, no ST/T changes   Rate 94    Darvocet - N: SOB, Other  Codeine: Other  Sulfa drugs: Other  Hydrocodone/ APAP: Other  Vital Signs/Nurse's Notes: **Vital Signs.:   04-Jun-14 05:27  Vital Signs Type Routine  Temperature  Temperature (F) 97.8  Celsius 36.5  Temperature Source oral  Pulse Pulse 85  Respirations Respirations 20  Systolic BP Systolic BP 270  Diastolic BP (mmHg) Diastolic BP (mmHg) 76  Mean BP 91  Pulse Ox % Pulse Ox % 93  Pulse Ox Activity Level  At rest  Oxygen Delivery Room Air/ 21 %  *Intake and Output.:   Shift 04-Jun-14 15:00  Grand Totals Intake:   Output:  1500    Net:  -1500 24 Hr.:  -1500  Urine ml     Out:  1500  Length of Stay Totals Intake:  12 Output:  2300    Net:  -2288    Impression Ms. Coffie is a 54 year old female w/ PMHx s/f HFpEF, COPD, OHS/OSA (intolerant of CPAP), morbid obesity, h/o tobacco abuse, RA (on MTX & chronic steroids PRN), steroid-induced DM2, hypothyroidism, chronic urinary retention (s/p bladder pacemaker), chronic venous insufficiency and anxiety who was admitted to [facility] yesterday for A/C diastolic CHF and cellulitis.   1. Weight gain, increased LE edema Likely from Acute on chronic diastolic CHF  abdominal distentention and  bilaterally pitting edema. Sx likely exacerbated by underlying poorly controlled OSA  I/O - 1928 mL so far.  -- Continue current Lasix IV regimen->transition to torsemide 40 mg po BID once sx have improved -- Discussed heart failure education to include salt/fluid restriction, weight and symptom monitoring and compression stockings/leg elevation when at rest --Needs referral to "feeling [facility]" for sleep study. Had part of the test but could not fall asleep on trazodone. Needs [name] [name] she reports.  2. Cellulitis? Suspect not cellulitis, suspect from skin stretching from edema (b/l same place, unlikely to have cellulitis both legs, identical) LE dopplers last admission were negative for DVT.  Continue lasix IV  3. Hypokalemia Replete potassium with diuresis  4. Hypomagnesemia 1.6 today -- Replete  5. COPD H/o tobacco abuse (54 pack-year history). She states she has not tolerated her COPD meds due to film forming on the  inside of her mouth. Stressed the importance of compliance with this and discussed strategies to ameliorate this.  --ICS + ipatropium +/- SABA  6. RA On MTX and chronic prednisone PRN.   7. Hypothyroidism TSH WNL this admission.  --Continue levothyroxine.   Plan 8. OSA/OHS Discussed with patient who would like  a repeat sleep study.  As above, need referral to "feeling great" with ambien  9. Chronic urinary retention She has had this since a young age prior to medication use. She has a bladder pacemaker. This complicates need for adequate daily diuresis. Question if some of her medications could worsen this with anticholinergic SEs. There may be some benefit to urology/OB/GYN follow-up +/- consideration of self-urinary catheterizing.  10. Type 2 DM Unclear that this is steroid-induced since this is only taken PRN. Technically, based on the new ACC guidelines, the patient should be on a statin. ASCVD risk is only 3.3% however.   Electronic Signatures: Meriel Pica (PA-C)  (Signed 04-Jun-14 10:48)  Authored: General Aspect/Present Illness, History and Physical Exam, Review of System, Past Medical History, Home Medications, Labs, EKG , Allergies, Vital Signs/Nurse's Notes, Impression/Plan Ida Rogue (MD)  (Signed 04-Jun-14 13:46)  Authored: General Aspect/Present Illness, History and Physical Exam, Review of System, Past Medical History, Health Issues, EKG , Vital Signs/Nurse's Notes, Impression/Plan  Co-Signer: General Aspect/Present Illness, Past Medical History, Home Medications, Labs, Allergies, Vital Signs/Nurse's Notes, Impression/Plan   Last Updated: 04-Jun-14 13:46 by Ida Rogue (MD)

## 2014-08-09 NOTE — H&P (Signed)
PATIENT NAME:  [name] [name], [name] [name] MR#:  [id number] DATE OF BIRTH:  04/05/1961  DATE OF ADMISSION:  07/07/2012  CHIEF COMPLAINT:  Shortness of breath, chest pressure.   HISTORY OF PRESENT ILLNESS:  A 54 year old female patient with history of rheumatoid arthritis, morbid obesity, presents to the Emergency Room sent in from [facility] urgent care with chest pressure and shortness of breath.  The patient has had worsening lower extremity swelling and shortness of breath over the past week.  She also feels congested and some chest pressure.  She felt dizzy on walking yesterday and fell twice.  Did not hit her head.  No loss of consciousness.  Her chest pressure is nonradiating, rates about 5 on 10 with no aggravating or relieving factors and is constant.  Shortness of breath worsens on exertion.  No significant breathing issues addressed.  In the Emergency Room, patient was made to walk about 10 feet and she got extremely short of breath, tachycardic, although her saturations are normal.   The patient had a chest x-ray which showed mild pulmonary edema.  CT scan of the chest mentioned some infrahilar infiltrate and bibasilar atelectasis.  No pulmonary embolism found.   EKG shows no acute changes.  Vitals have been stable.   The patient mentions that she gained about 40 pounds in the past few weeks.   PAST MEDICAL HISTORY: 1.  Rheumatoid arthritis.  2.  Morbid obesity.  3.  Newly diagnosed diabetes mellitus.  4.  Anxiety, depression.   FAMILY HISTORY:  Of congestive heart failure in her dad.  No premature coronary artery disease in the family.   SOCIAL HISTORY:  The patient does not smoke.  No alcohol.  No illicit drugs.  Ambulates on her own.   ALLERGIES:  CODEINE, DARVOCET, HYDROCODONE AND SULFA.   HOME MEDICATIONS: 1.  Acetaminophen oxycodone 325/5 oral every 6 hours as needed.  2.  Buspirone 10 mg oral 2 times a day.  3.  Folic acid 1 mg 2 tablets oral once a day.  4.  Humira 0.8 mL subcutaneous  2 times a month.  5.  Hydroxyzine 50 mg oral twice a day.  6.  Levothyroxine 75 mcg oral once a day.  7.  Metformin 500 mg oral once a day.  8.  Methotrexate 25 mg injection once a week.  9.  Neurontin 300 mg oral 3 times a day.  10.  Phenergan 1 tablet oral every 4 to 6 hours 25 mg.  11.  Prednisone 5 mg oral once a day as needed.  12.  Prozac 20 mg capsule 4 capsules oral once a day.  13.  Trazodone 200 mg oral once a day.   REVIEW OF SYSTEMS:  CONSTITUTIONAL:  Complains of no fever, but extreme fatigue, weakness and weight gain.  EYES:  No blurred vision, pain, redness.  EARS, NOSE, THROAT:  No tinnitus, ear pain, hearing loss.  RESPIRATORY:  Has a dry cough and wheezing on walking.  No COPD, hemoptysis.  Complains of shortness of breath.  CARDIOVASCULAR:  Has chest pressure and lower extremity edema.  No arrhythmias.  GASTROINTESTINAL:  No nausea, vomiting, diarrhea, abdominal pain.  GENITOURINARY:  No dysuria, hematuria.  Does have urinary urgency with implanted bladder stimulator.  ENDOCRINE:  No polyuria, nocturia.  Has hypothyroidism.  HEMATOLOGY AND LYMPHATIC:  No anemia, easy bruising, bleeding.  INTEGUMENTARY:  No acne, rash, lesions.  MUSCULOSKELETAL:  No arthritis or back pain.  NEUROLOGIC:  No focal numbness, weakness, seizures.  PSYCHIATRIC:  Has anxiety, depression.   PHYSICAL EXAMINATION: VITAL SIGNS:  Temperature 98.7, pulse of 76, respirations 20, blood pressure 147/71, saturating 93% on room air.  GENERAL:  Morbidly obese Caucasian female patient sitting up in bed in mild respiratory distress.  PSYCHIATRIC:  Alert, oriented x 3.  Mood and affect appropriate.  Judgment intact.  HEENT:  Atraumatic, normocephalic.  Oral mucosa moist and pink.  No oral ulcers or thrush.  External ears and nose normal.  No pallor.  No icterus.  NECK:  Supple.  No thyromegaly.  No palpable lymph nodes.  Trachea midline.  No carotid bruit, JVD.  CARDIOVASCULAR:  S1, S2 faintly heard.  No  murmurs.  Peripheral pulses 2+.  2+ edema in the lower extremities.  RESPIRATORY:  Increased work of breathing.  Clear on both sides with decreased air entry.  GASTROINTESTINAL:  Soft abdomen, nontender.  No hepatosplenomegaly palpable.  SKIN:  Warm and dry.  No petechiae, rash, ulcers.  Does have some erythema in her lower extremities which is chronic.  MUSCULOSKELETAL:  No joint swelling, redness, effusion of the large joints.  Normal muscle tone.  NEUROLOGICAL:  Motor strength 5 by 5 in upper and lower extremities.  Sensation to fine touch intact all over.   LABORATORY DATA:  Glucose of 132.  BNP 182, BUN 9, creatinine 0.58, sodium 139, potassium 3.8, CK of 46.  Troponin less than 0.02.  WBC 7.2, hemoglobin 11.5, platelets 245, MCV 80.  EKG shows normal sinus rhythm with rate of 74.  No acute ST-T wave changes.   Chest x-ray shows pulmonary edema.  No infiltrates.   CT of the chest for pulmonary embolism showed no PE, but has mild perihilar and lower lobe infiltrates.   ASSESSMENT AND PLAN: 1.  Chest pressure and shortness of breath in a patient with elevated BNP.  We will rule out acute coronary syndrome.  Admit the patient under observation on a tele floor.  I suspect patient has been onset congestive heart failure causing her symptoms.  We will start her on IV Lasix.  Check an echocardiogram along with ins and outs, low sodium diet.  This could also be contributed by atelectasis.  2.  Bilateral infiltrates.  A CAT scan of the chest has been read as bilateral basal infiltrates, although not impressive on review.  We will start patient on Levaquin, wait for cultures.  Nebulizers as needed and O2 to keep saturations over 92%.  3.  Diabetes mellitus.  Sliding scale insulin and diabetic diet.  Continue patient's metformin from home.  4.  Rheumatoid arthritis.  Continue home medications.  5.  Deep vein thrombosis prophylaxis with Lovenox.   CODE STATUS:  FULL CODE.   TIME SPENT TODAY ON THIS  CASE:  Was 45 minutes.    ____________________________ Molinda Bailiff Tawonna Esquer, MD srs:ea D: 07/07/2012 20:44:45 ET T: 07/08/2012 00:22:45 ET JOB#: [id number]  cc: Wardell Heath R. Elpidio Anis, MD, <Dictator> Orie Fisherman MD ELECTRONICALLY SIGNED 07/12/2012 19:47

## 2014-08-09 NOTE — Consult Note (Signed)
PATIENT NAME:  [name] [name], [name] [name] MR#:  [id number] DATE OF BIRTH:  1960-06-04  CARDIOLOGY CONSULTATION REPORT  DATE OF CONSULTATION:  08/31/2012  PRIMARY CARE PHYSICIAN: Dr. Mayford Knife, at [facility].  REQUESTING PHYSICIAN:  Dr. Elisabeth Pigeon.  CONSULTING PHYSICIAN:  Muhammad A. Kirke Corin, MD  REASON FOR CONSULTATION: Congestive heart failure.   HISTORY OF PRESENT ILLNESS: This is an unfortunate 54 year old female with multiple medical problems including rheumatoid arthritis, morbid obesity, chronic diastolic heart failure, hypothyroidism and chronic lower extremity edema. She presented to the Emergency Room with worsening shortness of breath, weight gain and lower extremity edema. She was admitted in March of this year with a similar presentation. She had an echocardiogram done which showed normal LV systolic function, with no evidence of valvular abnormalities and normal pulmonary pressure. She has been taking Lasix 40 mg once daily at home. However, she has difficulty with that due to a bladder disorder that she has, with difficulty emptying the bladder. She also reports being diagnosed with sleep apnea. However, she did not tolerate CPAP due to anxiety. She reports more than 30 pounds of weight gain since this year. She also has hypothyroidism, with a  slightly elevated TSH. Her BNP was normal. Chest x-ray showed mild pulmonary vascular congestion. Cardiac enzymes have been negative. She was evaluated for pulmonary embolism during her admission in March, which was negative.   PAST MEDICAL HISTORY: 1.  Chronic diastolic heart failure.  2.  Obstructive sleep apnea, untreated.  3.  Hypertension.  4.  Obesity.  5.  Rheumatoid arthritis.  6.  Anxiety.  7.  Depression.  8.  Hypothyroidism.  9.  Bladder disorder.   FAMILY HISTORY: Is remarkable for congestive heart failure. There is no history of premature coronary artery disease.   SOCIAL HISTORY: She quit smoking in 2002. She denies any alcohol or  recreational drug use.   HOME MEDICATIONS: Include:   1.  Trazodone.  2.  Prozac.  3.  Prednisone.  4.  Potassium chloride 20 mEq once daily.  5.  Neurontin 300 mg, 3 capsules daily.  6.  Methotrexate, injectable.  7.  Furosemide 40 mg once daily; she takes an additional 40 mg for weight gain.  8.  Folic acid.  9.  Abilify.   REVIEW OF SYSTEMS: A 10-point review of systems  was performed. It was negative other than what was mentioned in the HPI.   PHYSICAL EXAMINATION: GENERAL: The patient appears to be older than her stated age, but in no acute distress.  VITAL SIGNS: Temperature is 98.2, pulse is 92, respiratory rate is 17, blood pressure is  155/84, and oxygen saturation is 92% on room air.  HEENT: Normocephalic, atraumatic.  NECK: No JVD or carotid bruits.  RESPIRATORY: Normal respiratory effort, with no use of accessory muscles. Auscultation reveals normal breath sounds.  CARDIOVASCULAR: Normal PMI. Normal S1 and S2, with no gallops or murmurs.  ABDOMEN: Benign, nontender, nondistended.  EXTREMITIES: With +1 edema bilaterally, with chronic stasis dermatitis.  PSYCHIATRIC: She is alert, oriented x 3, but seems to be depressed and frustrated with her medical conditions.   LABORATORY AND DIAGNOSTIC DATA: ECG showed normal sinus rhythm with no significant ST or T wave changes.   Her labs showed normal kidney function.   BNP was 86. Cardiac enzymes were negative. TSH was elevated at 5.26.   IMPRESSIONS: 1.  Acute-on-chronic diastolic heart failure.  2.  Possible obesity-hypoventilation syndrome.  3.  Untreated sleep apnea, likely contributing to the above.  4.  Hypothyroidism, with elevated TSH.  5.  Morbid obesity.   RECOMMENDATIONS: The patient clearly seems to be fluid-overloaded in spite of normal BNP. Her chest x-ray showed only mild pulmonary vascular congestion. I do not think all her symptoms are due to heart failure, and I suspect that she has multifactorial issues  including morbid obesity, abnormal thyroid function and untreated sleep apnea. All of these issues are contributing to her recurrent presentation. Also, her bladder disorder makes it hard for her to get effective diuresis at home. Currently, she is getting IV Lasix and has a Foley in place. Her fluid overload improved significantly, but is still not at baseline. I recommend continuing a current dose of IV Lasix, 40 mg twice daily. I suspect that she probably can be discharged home tomorrow. I do feel that all the above issues have to be addressed in order to prevent her recurrent presentations.    ____________________________ Chelsea Aus Kirke Corin, MD maa:dm D: 08/31/2012 09:08:41 ET T: 08/31/2012 09:25:31 ET JOB#: [id number]  cc: Muhammad A. Kirke Corin, MD, <Dictator> Hope Pigeon. Elisabeth Pigeon, MD Lucillie Garfinkel Mayford Knife, MD Jerolyn Center Argentina Donovan MD ELECTRONICALLY SIGNED 09/01/2012 8:24

## 2014-08-09 NOTE — Discharge Summary (Signed)
PATIENT NAME:  [name] [name], [name] [name] MR#:  [id number] DATE OF BIRTH:  [birth year]  DATE OF ADMISSION:  07/07/2012 DATE OF DISCHARGE:  07/09/2012  PRESENTING COMPLAINT: Shortness of breath and leg swelling, with weight gain, about  40 pounds.   DISCHARGE DIAGNOSES: 1.  Acute congestive heart failure, diastolic, new-onset.  2.  Leg edema.  3.  Severe obstructive sleep apnea, untreated.  4.  Hypertension.  5.  Morbid obesity.  6.  Rheumatoid arthritis.   CONDITION ON DISCHARGE: Fair. Vitals stable; sats 92% to 93% on room air.   MEDICATIONS: 1.  Neurontin 300 mg t.i.d.  2.  Methotrexate 25 mg per mL injectable, 1 mL every week.  3.  Phenergan 25  mg  q.4-6 p.r.n.  4.  Acetaminophen/oxycodone 325/5 one tablet 6-hourly as needed.  5.  Levothyroxine 75 mcg daily.  6.  Trazodone 100 mg 2 tablets daily.  7.  Folic acid 1 mg 2 tablets daily.  8.  Prednisone 5 mg as needed.  9.  Hydroxyzine  hydrochloride 1 tablet b.i.d.  10.  Prozac 20 mg, 4 capsules, which is 80 mg, daily.  11.  Metformin 500 p.o. daily.  12.  Lasix 40 mg daily.  13.  K-Dur 20 mEq daily.   Follow up with Dr. Mayford Knife at [facility].   The patient is supposed to get an outpatient sleep study. The patient is recommended to call the sleep study on the 800 number given if not received phone call for the outpatient sleep study.   Echo Doppler showed EF of 60%. Normal left ventricular systolic function. Normal right ventricular systolic function, and normal RVSP. Chest x-ray consistent with pulmonary vascular congestion. Metabolic panel within normal limits. Cardiac enzymes x3 negative. Blood cultures negative in 36 hours.   CT chest: Mild perihilar  lower lobe infiltrates. No pulmonary embolus.   BRIEF SUMMARY OF HOSPITAL COURSE: The patient is a very, very pleasant 54 year old morbidly obese Caucasian female with a history of rheumatoid arthritis, comes in with increasing shortness of breath and significant leg swelling, edema,  and weight gain of 40 pounds. She was admitted with:  1.  Acute diastolic congestive heart failure: The patient was started on IV Lasix 40 b.i.d. along with potassium. She diuresed very well and put out about 5.8 liters of urine during the hospital stay. Her echo was noted, as above. Cardiac enzymes remained negative. The patient's blood pressure was a little bit in the lower side. Did not add any other cardiac meds. Her decompensated CHF is  suspected due to severe, untreated obstructive sleep apnea.  2.  Severe obstructive sleep apnea. The patient had been intolerant to CPAP. She is recommended to try nasal prong CPAP and see if she tolerates it. She will get a sleep study as an outpatient, and we will let primary care physician, Dr. Mayford Knife, follow up with it and get the patient established with CPAP.  3. Rheumatoid arthritis: Home medicines were continued. She received her methotrexate. Should take her Humira at home.  4.  Bilateral lower lobe infiltrate: Appears clinically nonspecific. Could be possibly pulmonary infiltrates due to RA; I am not sure, however there is no evidence of pneumonia. Antibiotics were discontinued.  5.  Type 2 diabetes: Sliding scale and metformin were given.   Hospital stay otherwise was unremarkable. THE PATIENT REMAINED A FULL CODE.   Weight reduction, exercise, lifestyle changes, and followup for sleep study and use of CPAP were readdressed with the patient.   TIME SPENT: 40  minutes.    ____________________________ Wylie Hail Allena Katz, MD sap:dm D: 07/09/2012 11:43:00 ET T: 07/09/2012 12:27:31 ET JOB#: [id number]  cc: Lucillie Garfinkel. Mayford Knife, MD Clinten Howk A. Allena Katz, MD, <Dictator>   Willow Ora MD ELECTRONICALLY SIGNED 07/15/2012 13:07

## 2014-08-09 NOTE — H&P (Signed)
PATIENT NAME:  [name] [name], [name] [name] MR#:  [id number] DATE OF BIRTH:  Apr 26, 1960  DATE OF ADMISSION:  10/13/2012  PRIMARY CARE PHYSICIAN:  Dr. Ulanda Edison.  REFERRING PHYSICIAN:  Dr. Sharma Covert.  CHIEF COMPLAINT:  Weight gain of 9 pounds in one week and dyspnea on exertion.  HISTORY OF PRESENT ILLNESS:  The patient is a 54 year old Caucasian obese female with past medical history of diastolic congestive heart failure, hypertension, obstructive sleep apnea, rheumatoid arthritis, is presenting to the ER with a chief complaint of worsening of shortness of breath and weight gain of 9 pounds in one week.  Denies any chest pain, but complaining of pain in between her shoulder blades.  Denies any dizziness or loss of consciousness.  The patient had similar complaints in the past and was admitted to the hospital and was given IV diuretics.  The patient's lower extremities are erythematous, but she is reporting that her lower extremities turned red intermittently and this is not unusual to her.  The patient was given Lasix IV in the ER.  The patient is getting admitted for CHF exacerbation.  PAST MEDICAL HISTORY:  Congestive heart failure, diastolic, obesity, obstructive sleep apnea, rheumatoid arthritis, hypothyroidism, anxiety and depression.  PAST SURGICAL HISTORY:  Bladder, pacemaker, hiatal hernia repair, gastric band surgery.    PSYCHOSOCIAL HISTORY:  Lives at home, lives independently.  Denies alcohol or illicit drug usage, but she used to smoke and smoked 27 years, 2 packs a day and quit smoking in year 2003.    ALLERGIES:  The patient is allergic to CODEINE, DARVOCET AND HYDROCODONE APAP, SULFA DRUGS.  HOME MEDICATIONS:  Trazodone 100 mg 2 tablets once a day, torsemide 60 mg two times a day, tamsulosin 0.4 mg one capsule once a day, Prozac 20 mg 4 capsules once a day, prednisone 5 mg once daily, potassium chloride 20 mEq once daily, Phenergan 25 mg as needed for nausea, vomiting, Neurontin 300 mg one  capsule 3 times a day, methotrexate injectable once a week on Monday, metformin 500 mg twice daily, levothyroxine 88 mcg once daily, glipizide 10 mg once daily, folic acid 1 mg two tablets once a day, Abilify 5 mg once daily.   FAMILY HISTORY:  Dad has congestive heart failure.  REVIEW OF SYSTEMS:  CONSTITUTIONAL:  Denies any fever.  Complaining of dyspnea on exertion, weight gain of 9 pounds in 7 days. EYES:  Denies any blurry vision, glaucoma, cataracts. EARS, NOSE, THROAT:  Denies any epistaxis, discharge, snoring, post nasal drip or difficulty in swallowing. RESPIRATION:  Denies any cough.  No asthma or painful respirations. CARDIOVASCULAR:  Denies any chest pain, palpitations, syncope. GASTROINTESTINAL:  No nausea, vomiting, diarrhea, GERD. GENITOURINARY:  No dysuria or hematuria, renal calculi.  GYNECOLOGIC AND BREAST:  Denies any breast mass or vaginal discharge. ENDOCRINOLOGY:  Has hypothyroidism.  Denies any polyuria, nocturia or increased thirst. HEMATOLOGY AND LYMPHATIC:  Denies any anemia, easy bruising or bleeding. INTEGUMENTARY:  No acne, rash, lesions.  Has chronic lower extremity erythema which waxes and wanes intermittently. MUSCULOSKELETAL:  Complaining of rheumatoid arthritis, but denies any gout.  No shoulder pain or back pain or leg pain. NEUROLOGIC:  Denies any vertigo, ataxia, dementia, headache. PSYCHIATRIC:  Has anxiety and depression.  Denies any ADD, OCD, bipolar disorder.  PHYSICAL EXAMINATION: VITAL SIGNS:  Temperature 98.2, pulse 86, respirations 20, blood pressure is 117/73, pulse ox 95%. GENERAL APPEARANCE:  Not under acute distress.  Moderately built and obese.  HEENT:  Normocephalic, atraumatic.  Pupils are equal,  reacting to light and accommodation.  Extraocular movements are intact.  No scleral icterus.  No conjunctival injection.  No sinus tenderness.  No postnasal drip.  NECK:  Supple.  No JVD.  No thyromegaly.  Range of motion is intact.  LUNGS:   Minimal rales at the bases.  CARDIOVASCULAR:  S1, S2  normal, regular rate and rhythm.  No murmurs. GASTROINTESTINAL:  Soft, obese, bowel sounds are positive in all 4 quadrants.  Nontender, nondistended.  No masses felt.  No hepatosplenomegaly.   NEUROLOGIC:  Awake, alert, oriented x 3.  Cranial nerves II through XII are intact.  Motor and sensory intact.  Reflexes are 2+.   EXTREMITIES:  2+ pitting edema is present.  No cyanosis, no clubbing.  Bilateral lower extremities were erythematous, but patient is reporting that it is chronic in nature.   MUSCULOSKELETAL:  No joint effusion, tenderness or erythema.  PSYCHIATRIC:  Normal mood and affect.   SKIN:  Warm to touch.  Normal turgor.  No rashes.  No lesions.   LABORATORY AND IMAGING STUDIES:  Chest x-ray revealed minimal pulmonary edema.  A 12-lead EKG, normal sinus rhythm at 79 beats per minute.  Normal PR interval.  Normal QRS duration.  No acute ST-T wave changes are noticed.  BNP is 244, BUN 15, creatinine 0.68, sodium 136, potassium 3.3, chloride 100, CO2 29, GFR greater than 60, anion gap 7, serum osmolality 275, calcium 9.0.  LFTs are within normal range except albumin which is low at 3.0.  Troponin less than 0.02.  WBC 8.4, hemoglobin 11.4, hematocrit 34, platelets 322.  Urinalysis, 2+ leukocyte esterase is present, nitrite positive, protein negative, blood negative, yellow in color, clear in appearance.    ASSESSMENT AND  PLAN:  A 54 year old female comes with dyspnea on exertion and weight gain of 9 pounds in one week, will be admitted with the following assessment and plan. 1.  Acute on chronic diastolic congestive heart failure.  Lasix 80 mg IV will be provided q. 12 hours.  We will start the patient on low dose beta-blocker as there are no absolute contraindications.  The patient is reporting that she just had echocardiogram done a few weeks ago and we will try to obtain that report from Dr. Mariah Milling.  Cardiology consult is placed.  We will  cycle cardiac biomarkers.   2.  Hypertension.  We will resume her home medication. 3.  Obstructive sleep apnea.  The patient is currently not on CPAP.   4.  Rheumatoid arthritis, stable.  We will continue her home medication prednisone on a daily basis.  5.  We will provide her GI and DVT prophylaxis. 6.  For depression and anxiety we will resume her home medication. 7.  The patient is FULL CODE.  The plan of care was discussed with the patient.  She is aware of the plan.  Total time spent on the admission is 50 minutes.   ____________________________ Ramonita Lab, MD ag:ea D: 10/14/2012 01:04:38 ET T: 10/14/2012 02:25:25 ET JOB#: [id number]  cc: Ramonita Lab, MD, <Dictator> Antonieta Iba, MD Lucillie Garfinkel. Mayford Knife, MD  Ramonita Lab MD ELECTRONICALLY SIGNED 10/16/2012 2:41

## 2014-08-10 NOTE — H&P (Signed)
PATIENT NAME:  [name] [name], [name] [name] MR#:  [id number] DATE OF BIRTH:  28-Nov-1960  DATE OF ADMISSION:  06/25/2013  REFERRING PHYSICIAN: Lurena Joiner L. Shaune Pollack, MD   PRIMARY CARE PHYSICIAN: Lucillie Garfinkel. Mayford Knife, MD  CHIEF COMPLAINT: Swelling all over the body.   HISTORY OF PRESENT ILLNESS: [name] [name] is a 54 year old, morbidly obese female with a history of rheumatoid arthritis, hypertension, diastolic congestive heart failure, diabetes mellitus. She presented to the Emergency Department with the complaint of swelling and pain all over the body. The patient is unable to walk for the last few days, especially worse in the last 3 to 4 days. The patient does not remember when was the last time she walked around the house. The patient, in the past, followed by Dr. Obie Dredge in [place]. Since the patient has lost her insurance, she has been unable to follow up with Dr. Nickola Major. The patient was also on biologics.  The patient continues to take her methotrexate. She did not have any fever. Denies having any recent upper respiratory tract infection or gastrointestinal infection. Denies having any cough, shortness of breath. Concerning this, she came to the Emergency Department. On work up in the Emergency Department, the patient is found to have urinary tract infection. BNP is 46. CBC and complete metabolic panel are completely within normal limits except for a glucose of 370 and a sodium of 130.   PAST MEDICAL HISTORY: 1.  Rheumatoid arthritis.  2.  Congestive heart failure, diastolic.  3.  Morbid obesity, BMI of 58.  4.  Obstructive sleep apnea.  5.  Hypothyroidism.  6.  Anxiety/depression.  7.  Neurogenic bladder.   PAST SURGICAL HISTORY:  1.  Bladder sling. 2.   pacemaker.  3.  Hernia repair.  4.  Lap band surgery and removal. 5.  Cholecystectomy. 6.  Tonsillectomy.  7.  Adenoidectomy. 8.  Partial hysterectomy. 9.  Ventral hernia repair x4.   ALLERGIES:   1.  CODEINE. 2.  DARVOCET. 3.  HYDROCODONE  WITH TYLENOL. 4.  SULFA DRUGS.  HOME MEDICATIONS:  1.  Vitamin D3 1000 units once a day.  2.  Victoza 1.2 mL subcutaneous.  3.  Trazodone 200 mg once a day.  3.  Torsemide 60 mg 2 times a day.  4.  Tamsulosin 0.4 mg once a day.  5.  Prozac 20 mg 4 times a day.  6.  Phenergan 25 mg 3 times a day.  7.  Prednisone 10 mg once a day as needed for swelling.  8.  Potassium chloride 20 mEq 2 times a day.  9.  Percocet 5/325 mg 1 tablet 3 times a day.  10.  Metolazone 2.5 mg 3 times a week.  11.  Methotrexate 25 mg injectable once a week on Monday.  12.  Metformin 500 mg 2 times a day.  13.  Lovastatin 40 mg once a day.  14.  Levothyroxine 88 mcg once a day.  15.  Ketoconazole to the affected area.  16.  Gabapentin 300 mg 3 times a day.  17.  Folic acid 1 mg 2 times a day.  18.  Combivent 1 puff inhaler 3 times a day.  19.  Advair Diskus 250/50 inhaled 2 times a day.  20.  Abilify 5 mg once a day.   SOCIAL HISTORY: Former smoker. Smoked 1 to 2 packs a day, quit in 2003. Denies drinking alcohol or using any illicit drugs. Currently lives with her daughter.   FAMILY HISTORY: History of heart  problems, diabetes mellitus and hypertension.   REVIEW OF SYSTEMS:  CONSTITUTIONAL: Severe generalized weakness.  EYES: No change in vision.  ENT: No change in hearing.  RESPIRATORY: Has no cough, shortness of breath.  CARDIOVASCULAR: No chest pain, palpitations.  GASTROINTESTINAL: No nausea, vomiting, abdominal pain.  GENITOURINARY: No dysuria or hematuria.  ENDOCRINE: Has diagnosis of diabetes mellitus.  SKIN: No rash or lesions.  MUSCULOSKELETAL: No joint pains and aches.  NEUROLOGIC: No weakness or numbness in any part of the body.  MUSCULOSKELETAL: Has severe swelling in all the joints, unable to move.   PHYSICAL EXAMINATION:  GENERAL: This is a well-built, well-nourished, age-appropriate, morbidly obese female lying down in the bed, not in distress.  VITAL SIGNS: Temperature 98, pulse 87,  blood pressure 152/74, respiratory rate of 18, oxygen saturation is 95% on 2 liters of oxygen.  HEENT: Head normocephalic, atraumatic. There is no scleral icterus. Conjunctivae normal. Pupils equal and react to light. Mucous membranes moist. No pharyngeal erythema.  NECK: Supple. No lymphadenopathy. No JVD. No carotid bruit.  CHEST: Has no focal tenderness.  LUNGS: Bilaterally clear to auscultation.  HEART: S1, S2 regular. No murmurs are heard.  ABDOMEN: Bowel sounds present. Soft, nontender, nondistended.  EXTREMITIES: No pedal edema. Pulses 2+.  MUSCULOSKELETAL: Has diffuse swelling of the proximal and distal phalanges and joints in the foot. Decreased range of motion in metacarpals of wrists, elbows as well as ankles.  NEUROLOGIC: The patient is alert, oriented to place, person and time. Cranial nerves II through XII intact. Motor 5/5 in upper and lower extremities.   LABS: CBC and CMP are completely within normal limits except for glucose of 371, potassium of 130. The rest of all the values are within normal limits. Troponin less than 0.02. BNP 46. Chest x-ray, one view portable: Mild cardiomegaly without focal findings. Urinalysis 2+ leukocyte esterase, WBCs 49, sedimentation rate of 57.   ASSESSMENT AND PLAN: [name] [name] is a 54 year old female who comes to the Emergency Department with diffuse swelling in both hands and legs, unable to walk.  1.  Rheumatoid arthritis flare-up. Keep the patient on Solu-Medrol, consult rheumatology in the morning. Obtain sedimentation rate and CRP.  2.  Urinary tract infection: Obtain urine cultures. Continue with Rocephin.  3.  Diabetes mellitus. The patient is on metformin. The patient's blood sugars will worsen after starting on steroids.  4.  Continue sliding scale insulin. We will also obtain hemoglobin A1c.  5.  Morbid obesity. The patient has BMI of 58; however, the patient seems to have quite poor insight.  6.  Debility. We will involve physical  therapy.  7.  Keep the patient on deep vein thrombosis prophylaxis with Lovenox.   TIME SPENT: 50 minutes.  ___________________________ Susa Griffins, MD pv:sw D: 06/25/2013 23:55:52 ET T: 06/26/2013 01:17:14 ET JOB#: [id number]  cc: Susa Griffins, MD, <Dictator> Susa Griffins MD ELECTRONICALLY SIGNED 07/05/2013 4:37

## 2014-08-10 NOTE — Consult Note (Signed)
PATIENT NAME:  [name] [name], CARPENITO MR#:  [id number] DATE OF BIRTH:  [birth year]  DATE OF CONSULTATION:  06/26/2013  REFERRING PHYSICIAN:   CONSULTING PHYSICIAN:  Dessie Coma., MD  REASON FOR CONSULTATION: Rheumatoid flare.   HISTORY OF PRESENT ILLNESS: The patient is a 54 year old white female. She has a history of diabetes and sleep apnea and significant obesity. In 2012 she developed polyarthritis and was seen in the Emergency Room at [facility] and then by Dr. Lendon Colonel in [place]. She was placed on methotrexate with occasional use of prednisone. She then took Enbrel which she had to stop because of poor response and then she took Humira, complicated by congestive heart failure. She was switched over to IV Orencia with good response. Last infusion was in November. She lost her insurance. It is a monthly infusion. In the last 2 weeks, particularly in the last few days she has had major flare, difficult to ambulate because of pain and swelling in her ankles and feet, and difficulty with pain in her hands. Her knees are not as involved. Not sure if they swell. She has not had to have any surgical procedure. She presented to the Emergency Room, was given IV Solu-Medrol. Sugar went to 400, and she has had to receive sliding scale.   PAST MEDICAL HISTORY: Kidney stones, congestive heart failure, morbid obesity, sleep apnea, hypothyroid, neurogenic bladder, anxiety, fatty liver, rheumatoid arthritis.  PAST SURGICAL HISTORY: Bladder procedure, hernia, cholecystectomy, T and A. hysterectomy.   MEDICATIONS: Oxycodone, Abilify, recent ceftriaxone, Lovenox, folic acid, methotrexate injection q. week, trazodone 200 at bedtime.   SOCIAL HISTORY: No cigarettes or alcohol.   FAMILY HISTORY: Negative for rheumatoid.   REVIEW OF SYSTEMS: As above. No significant shortness of breath or chest pain or dysuria.   PHYSICAL EXAMINATION: GENERAL: Pleasant female lying in the bed. The nurse said she had just  gotten up to go to the bathroom unassisted.  VITAL SIGNS: Temperature 97, pulse 89, blood pressure 98/62, oxygen 91 on 2 liters.  HEENT: Sclerae clear.  NECK: Good carotid upstroke.  CHEST: Clear.  HEART: No significant murmur.  ABDOMEN: No visceromegaly.  EXTREMITIES: 1+ edema.  MUSCULOSKELETAL: Synovitis of wrists, MCPs and PIPs. Shoulders move reasonably. Knees without effusion. Hips move reasonably. She has pain with motion of both ankles with decreased range of motion. MTPs are mildly tender.   IMPRESSION: 1.  Rheumatoid arthritis status post rheumative agent. Came off her biologic drug 3 months ago because of loss of insurance, now with major flare, somewhat improved with steroids, on chronic methotrexate.  2.  Significant diabetes with hyperglycemia.  3.  Sleep apnea with hypoxemia.  4.  Morbid obesity.   PLAN: 1.  Given her hyperglycemia would stop her IV steroid and put her on 60 mg of prednisone with a slow taper.  2.  Diabetic management, based on that.  3.  She can follow up with her long-standing rheumatologist. She is hoping to get on Medicaid so she can get back on the Orencia, might be a candidate for patient assistance, etc.   ____________________________ Dessie Coma., MD gwk:sb D: 06/26/2013 16:52:43 ET T: 06/26/2013 17:20:18 ET JOB#: [id number]  cc: Dessie Coma., MD, <Dictator> [facility] Lezlie Lye MD ELECTRONICALLY SIGNED 06/27/2013 18:37

## 2014-08-10 NOTE — Discharge Summary (Signed)
PATIENT NAME:  [name] [name], KRATKY MR#:  [id number] DATE OF BIRTH:  10-Nov-1960  DATE OF ADMISSION:  08/04/2013 DATE OF DISCHARGE:  08/05/2013  ADMISSION COMPLAINT: Body pain.   DISCHARGE DIAGNOSES: 1.  Rheumatoid arthritis flare.  2.  Severe hyperglycemia with hyperosmolar state without coma.  3.  Uncontrolled type 2 diabetes.  4.  Hyponatremia.  5.  Morbid obesity.  6.  Hypothyroidism.  7.  Hyperlipidemia.  8.  Hypokalemia.    MEDICATIONS AT DISCHARGE: Abilify 5 mg once daily, levothyroxine 88 mcg once daily, trazodone 100 mg 2 tablets once a day at bedtime, Flomax 0.4 mg once a day, gabapentin 300 mg 3 times a day, folic acid 1 mg twice daily, Prozac 20 mg 4 times a day, Advair Diskus 250 mg/50 mg 2 times a day, vitamin D 3000 units once a day, Ketoconazole cream applied to affected areas, Advair Diskus 250 mg/50 twice daily, Combivent as needed for shortness of breath, methotrexate 25 mg/mL 1 mL injectable every Monday, lovastatin 40 mg once a day, promethazine 25 mg 3 times daily, Percocet 5/325 mg 3 times a day as needed for pain, potassium chloride 20 mEq once a day, furosemide 20 mg tablets 3 tablets twice daily, trazodone 100 mg take 2 tablets at bedtime, prednisone taper, metformin 1000 mg twice daily, NovoLog Flexpen sliding scale, glipizide 10 mg twice daily, insulin Levemir 20 units subcutaneously 2 times a day. The patient has been instructed about the use of these medications. Her blood sugars are severely elevated due to the steroids. The steroids are on a taper. When taper finishes, her blood sugars should start to come down. The patient is going to check her blood sugars 4 times a day from now on, and if blood sugars are staying below 200, she is going to cut back to her original dose, which is 30 units at bedtime. Glucometer, glucose test strips and lancets have been given.   FOLLOWUP: With primary care physician in the next two weeks and rheumatology in the next week.   HOSPITAL  COURSE: This is a very nice, 54 year old female with a history of hypertension, diastolic congestive heart failure, diabetes, rheumatoid arthritis, who presented to the Emergency Department with a history of body aches, which will not let her function. For this reason, she was admitted as an observation. The patient was started on 60 mg of prednisone. The day that she started taking steroids, her blood sugars went up to above 700, and the patient had significant issues with dehydration and polyuria due to being in a hyperosmolar state. She was never in any coma, and there were no signs of altered mental status.   The patient was put on insulin drip, and her blood sugars remained high, in the 500s to 600s, for a while, for which the insulin drip remained for two days. Finally, this morning, the patient was able to be (started onto Levemir and insulin sliding scale, and insulin drip stopped. Her blood sugars have been ranging in the 200s to 300s. She has a hemoglobin A1c of 11.8, for what this is not that far from how she lives at home. The patient is going to working on compliance with her medications, especially diabetes. The patient used to be on Orencia treatments for her rheumatoid arthritis, but her insurance stopped paying for it, for which she has more frequent flares. At this moment, she is working with a Higher education careers adviser program to see if they can facilitate this medication. Overall, the patient did  well during this hospitalization. She had some constipation due to the use of pain medications. She was impacted, for which she was manually disimpacted. Other than that, the patient is able to be discharged in good condition.   TIME SPENT: I spent about 45 minutes discharging this patient.   ____________________________ Felipa Furnace, MD rsg:cg D: 08/05/2013 14:43:14 ET T: 08/05/2013 23:21:16 ET JOB#: [id number]  cc: Felipa Furnace, MD, <Dictator> Jasmeet Manton Juanda Chance  MD ELECTRONICALLY SIGNED 08/21/2013 22:53

## 2014-08-10 NOTE — Consult Note (Signed)
PATIENT NAME:  [name] [name], [name] [name] MR#:  [id number] DATE OF BIRTH:  1960/04/20  DATE OF CONSULTATION:  12/02/2013  REFERRING PHYSICIAN:   CONSULTING PHYSICIAN:  Bhavya Eschete K. Kodi Guerrera, MD  SUBJECTIVE: The patient was seen in consultation, room #211, at [facility]. The patient was seen along with best friend. The patient is a 54 year old white female who is divorced for 2 years after being married for 13 years. The patient lives with her daughter who is 67 years old and gets along with her. The patient reports that she has a long history of depression and had inpatient hospitalization to psychiatry and had suicidal thoughts but no suicide attempt.   ALCOHOL AND DRUGS: Denied.  MENTAL STATUS: The patient is seen lying in bed. She reports that she came to the hospital for depression and currently right now she is having problems with vomiting and nausea and she is being investigated and is worried about the same. Admits that she is not as depressed as she was before and depression is better, but she is worried about her physical state. No psychosis. She does not appear to be responding to internal stimuli. Cognition intact. Denies any ideas or plans to hurt herself or others. Insight and judgment fair. Impulse control is fair.  IMPRESSION: Major depressive disorder, recurrent.  RECOMMENDATIONS: Continue current medications as recommended by behavioral health and to be re-evaluated after the patient is medically stabilized.   ____________________________ Jannet Mantis. Guss Bunde, MD skc:TT D: 12/02/2013 17:27:20 ET T: 12/02/2013 20:14:04 ET JOB#: [id number]  cc: Monika Salk K. Guss Bunde, MD, <Dictator> Beau Fanny MD ELECTRONICALLY SIGNED 12/05/2013 23:08

## 2014-08-10 NOTE — H&P (Signed)
PATIENT NAME:  [name] [name], CERCONE MR#:  [id number] DATE OF BIRTH:  06-26-1960  DATE OF ADMISSION:  08/02/2013  PRIMARY CARE PROVIDER: Ulanda Edison, M.D.   PRIMARY RHEUMATOLOGIST: Dr. Thelma Barge in [place]  CHIEF COMPLAINT: Pain in all her joints   HISTORY OF PRESENT ILLNESS: This is a 54 year old morbidly obese Caucasian female patient with history of hypertension, diastolic CHF, diabetes mellitus, and rheumatoid arthritis who presents to the Emergency Room complaining of pain in all her joints, unable to walk. The patient has multiple stairs outside her home, in the home, which she has not been able to climb. Here in the Emergency Room, the patient has been found to be afebrile, normal blood counts except for blood sugars being extremely high, greater than 350, and is being admitted to the hospitalist service under observation for pain control.   The patient was discharged home recently on 07/04/2013 to follow up with her rheumatologist. She did follow up with her rheumatologist in [place], was prescribed Orencia infusions, but this has not been approved by Medicaid and the patient's symptoms have worsened.   PAST MEDICAL HISTORY:  1.  Rheumatoid arthritis. 2.  Congestive heart failure, diastolic.  3.  Morbid obesity with BMI of 58.  4.  Obstructive sleep apnea.  5.  Hypothyroidism.  6.  Anxiety and depression. 7.  Neurogenic bladder.  8.  Diabetes mellitus.   PAST SURGICAL HISTORY:  1.  Bladder sling surgery. 2.  Pacemaker. 3.  Hernia repair. 4.  Cholecystectomy.  5.  Tonsillectomy and adenoidectomy. 6.  Partial hysterectomy.   ALLERGIES: CODEINE, DARVOCET, HYDROCODONE, SULFA.   SOCIAL HISTORY: The patient smoked in the past, 1 to 2 packs a day, quit in 2003. No alcohol. No illicit drugs. Lives with her daughter.   FAMILY HISTORY: Heart problem, diabetes, hypertension.   REVIEW OF SYSTEMS:  CONSTITUTIONAL: Complains of fatigue, weakness, weight gain. EYES: No blurred vision,  pain or redness. ENT: No tinnitus, ear pain, hearing loss. RESPIRATORY: No cough, wheezing, or hemoptysis. CARDIOVASCULAR: No chest pain, orthopnea, or edema. GASTROINTESTINAL: No nausea, vomiting, diarrhea, abdominal pain.  GENITOURINARY: No dysuria, hematuria or frequency. ENDOCRINE: No polyuria, nocturia, thyroid problems.  HEMATOLOGIC AND LYMPHATIC: No anemia, easy bruising, bleeding.  INTEGUMENTARY: No acne, rash or lesions. MUSCULOSKELETAL: Has pain in all her joints with swelling.  NEUROLOGIC: No focal numbness, weakness, seizures.  PSYCHIATRIC: Has depression.   HOME MEDICATIONS: 1.  Prednisone 60 mg daily.  2.  Abilify 5 mg daily.  3.  Advair 250/50 one puff inhaled 2 times a day.  4.  Combivent Respimat 1 puff inhaled 4 times a day as needed.  5.  Folic acid 1 mg 2 times a day. 6.  Gabapentin 300 mg 3 times a day.  7.  Levothyroxine 88 mcg oral once a day.  8.  Lovastatin 40 mg daily.  9.  Metformin 500 mg oral 2 times a day. 10.  Methotrexate 1 mL injectable once a week on Monday.  11.  NovoLog 10 units subcutaneous 3 times a day.  12.  Percocet 5/325 one tablet oral 3 times a day.  13.  Potassium chloride 20 mEq oral once a day. 14.  Prozac 20 mg oral 2 times a day.  15.  Tamsulosin 0.4 oral once a day.  16.  Torsemide 20 mg 3 tablets 2 times a day. 17.  Trazodone 100 mg 2 tablets at bedtime.  18.  Vitamin D 3000 international units oral once a day.   PHYSICAL EXAMINATION: VITAL  SIGNS: Temperature 97.5, pulse 88, blood pressure 134/100, saturating 96% on room air. GENERAL: Morbidly obese Caucasian female patient lying in bed in distress secondary to her pain.  PSYCHIATRIC: Alert and oriented x3, anxious.  HEENT: Atraumatic, normocephalic. Oral mucosa moist and pink. External ears and nose normal. No pallor or icterus. Pupils bilaterally equal and reactive to light.  NECK: Supple. No lymphadenopathy or thyromegaly. No palpable lymph nodes. Trachea midline. No carotid  bruit, JVD.  HEART: S1 and S2 without any murmurs. Peripheral pulses 2+. No edema.  LUNGS: Normal work of breathing. Clear to auscultation and percussion. ABDOMEN: Soft, distended, nontender. Bowel sounds present. No hepatosplenomegaly palpable.  GENITOURINARY: No significant bladder distention.  SKIN: Warm and dry.  MUSCULOSKELETAL: Has tenderness in all her joints. I do not see any significant swelling or redness. Normal range of motion.  NEUROLOGIC: Motor strength 5/5 in upper extremities.  LYMPHATIC: No cervical lymphadenopathy.   LABORATORY STUDIES: Glucose 376, BUN 11, creatinine 0.55, sodium 139, potassium 3.7, chloride 97. GFR greater than 60. AST, ALT, alkaline phosphatase, bilirubin normal. WBC 9.2, hemoglobin 12.2, platelets 285,000, neutrophils 75%.   ASSESSMENT AND PLAN: 1.  Rheumatoid arthritis with involvement of multiple joints. The patient is unable to get her medications as outpatient secondary to her insurance problems. She mentions that her rheumatologist is working on getting her Orencia approved through [place], but presently she is unable to get out of bed or walk. considering the significant pain, will admit for pain control. She does have multiple allergies. I have advised the patient that we will be increasing her Percocet to 10/325. Will need to come up with a plan with oral pain medications for discharge instead of IV medications. She understands that she could likely be discharged tomorrow, once pain is controlled. May need home health. We will consult case manager. The patient is afebrile, normal white count, no left shift. Needs to follow up with her rheumatologist as outpatient. Admit her under observation for pain control.  2.  Diabetes mellitus. Likely worse secondary to her prednisone. Her HbA1c was 11.3 during the last admission. She is on metformin and NovoLog at home. We will add glipizide b.i.d. and monitor.  3.  Chronic diastolic congestive heart failure,  stable. Continue the torsemide the patient is on.  4.  Depression. Continue medications  5.  Deep vein thrombosis prophylaxis with Lovenox.  CODE STATUS: FULL.  TIME SPENT: Today on this case was 45 minutes.  ____________________________ Molinda Bailiff Doryce Mcgregory, MD srs:sb D: 08/02/2013 12:46:26 ET T: 08/02/2013 13:05:18 ET JOB#: [id number]  cc: Wardell Heath R. Scott Fix, MD, <Dictator> Lucillie Garfinkel. Mayford Knife, MD Dr. Nickola Major (Rheumatology, [facility])  Orie Fisherman MD ELECTRONICALLY SIGNED 08/03/2013 12:51

## 2014-08-10 NOTE — Consult Note (Signed)
PATIENT NAME:  [name] [name], WINDISH MR#:  [id number] DATE OF BIRTH:  [birth year]  DATE OF CONSULTATION:  07/02/2013  REFERRING PHYSICIAN:  Shreyang H. Allena Katz, MD CONSULTING PHYSICIAN:  A. Wendall Mola, MD  CHIEF COMPLAINT: Uncontrolled diabetes.   HISTORY OF PRESENT ILLNESS: This is a 54 year old female with a history of diabetes who was admitted on March 9 with weakness and diffuse swelling. She was started on high-dose steroids for rheumatoid arthritis flare. She has been receiving prednisone 60 mg daily. This has provoked severe hyperglycemia, which had been treated for a time with IV insulin. She is now on subcutaneous insulin with Levemir 30 units daily and NovoLog sliding scale. Yesterday, blood sugars were in the 300 to 400 range; however, today, blood sugars are much improved in 119 to 260 range.   The patient is somewhat of a poor historian, although she does believe she has had diabetes for about 3 years. She estimates she has been taking medication for diabetes for about 2 years. As an outpatient, she was on metformin 500 mg b.i.d. and Victoza 1.2 mg daily. She recalls blood sugars were uncontrolled on this regimen and often very high, although she has not recently been checking her blood sugars. She does not recall her last hemoglobin A1c. Diabetes is complicated by peripheral neuropathy. She complains of pain in the feet. She denies a known history of retinopathy or nephropathy. She is morbidly obese. She denies any recent change in her weight. Currently appetite is fair. Denies nausea, vomiting.   PAST MEDICAL HISTORY: 1.  Type 2 diabetes mellitus.  2.  Diabetic peripheral neuropathy.  3.  Morbid obesity.  4.  Rheumatoid arthritis.  5.  Obstructive sleep apnea.  6.  History of congestive heart failure.  7.  Nephrolithiasis.  8.  Hypothyroidism.  9.  History of neurogenic bladder.  10.  Anxiety disorder.  11.  Hypertension.   CURRENT MEDICATIONS: 1.  Levemir 30 units daily.  2.   NovoLog insulin sliding scale.  3.  Abilify 5 mg daily.  4.  Vitamin D 1000 units daily.  5.  Fluoxetine 20 mg daily.  6.  Folic acid 1 mg daily.  7.  Potassium ER 20 mEq b.i.d.  8.  Neurontin 300 mg t.i.d.  9.  Levothyroxine 88 mcg daily.  10.  Lovastatin 40 mg at bedtime.  11.  Prednisone 60 mg daily.  12.  Flomax 0.4 mg daily.  13.  Trazodone 200 mg at bedtime.  14.  Enoxaparin 40 mg b.i.d.  15.  Advair Diskus 250/50, 1 puff b.i.d.    ALLERGIES: CODEINE, DARVOCET, SULFA DRUGS.   SOCIAL HISTORY: Denies use of tobacco or alcohol.   FAMILY HISTORY: Positive for diabetes, heart disease, and hypertension.   REVIEW OF SYSTEMS:   GENERAL: Denies fevers. Denies weight change.  HEENT: Denies blurred vision. Denies sore throat.  NECK: Denies neck pain or dysphagia.  CARDIAC: Denies chest pain or palpitation.  PULMONARY: Denies cough or shortness of breath.  GASTROINTESTINAL: Appetite is fair. Denies nausea. No diarrhea. MUSCULOSKELETAL: Complains of chronic leg swelling. Complains of chronic arthralgias and myalgias.  ENDOCRINE: Denies heat or cold intolerance.  GENITOURINARY: Reports neurogenic bladder. Denies dysuria.  HEMATOLOGIC: Denies easy bruisability or recent bleeding.   PHYSICAL EXAMINATION:  VITAL SIGNS: Height 65.9 inches, weight 328 pounds, BMI 53. Temperature 98, pulse 76, respirations 18, blood pressure 108/68, pulse oximetry 93% on 2 liters O2.  GENERAL: Morbidly obese white female in no acute distress.  HEENT: EOMI. Oropharynx  is clear. Mucous membranes moist.  NECK: Supple. No thyromegaly.  CARDIAC: Regular rate and rhythm without murmur.  PULMONARY: Clear to auscultation bilaterally. No wheeze.  ABDOMEN: Diffusely soft, nontender.  EXTREMITIES: Trace edema of the lower extremities is present.  SKIN: Pretibial hyperpigmentation is present. No rash otherwise is visualized. A bilateral barefoot exam shows no calluses or lesions.  PSYCHIATRIC: Calm, cooperative.   NEUROLOGIC: Alert and oriented. No focal deficits.   LABORATORY DATA: Glucose 107, BUN 31, creatinine 0.94, sodium 131, potassium 3.7, chloride 91, bicarbonate 34, calcium 9.1.   ASSESSMENT: Type 2 diabetes, uncontrolled, with worsened hyperglycemia in the setting of high-dose steroids.   RECOMMENDATIONS: 1.  Agree with use of basal insulin. Will continue with current dose of her Levemir. 2.  Restart metformin. She is no longer having diarrhea and agrees that she could likely tolerate this medication. Plan is to start 500 mg b.i.d.  3.  Add a mealtime insulin, as her sugars tend to increase as the day goes on. Plan to start with NovoLog 4 units t.i.d. before meals. Continue fingerstick blood sugars before meals and bedtime, with NovoLog insulin sliding scale as needed.  4.  Continue low-carbohydrate diet.  5.  She would certainly benefit long-term from increased mobility and weight loss.   Thank you for the kind request for consultation. I will follow along with you.   ____________________________ A. Wendall Mola, MD ams:jcm D: 07/02/2013 16:34:45 ET T: 07/02/2013 19:48:09 ET JOB#: [id number]  cc: A. Wendall Mola, MD, <Dictator> Macy Mis MD ELECTRONICALLY SIGNED 07/07/2013 12:52

## 2014-08-10 NOTE — H&P (Signed)
PATIENT NAME:  [name] [name], [name] [name] MR#:  [id number] DATE OF BIRTH:  [birth year]  DATE OF ADMISSION:  12/02/2013  PRIMARY CARE PHYSICIAN: Dr. Ulanda Edison.   CHIEF COMPLAINT: Intractable nausea, vomiting and diarrhea, and worsening of lower extremity swelling and redness.   HISTORY OF PRESENT ILLNESS: The patient is a 54 year old morbidly obese female with a history of insulin-dependent diabetes mellitus and obstructive sleep apnea, and multiple other medical problems, who was admitted to the behavioral therapy unit for depression. The hospitalist service was consulted on August 14 regarding her lower extremity swelling and redness, and uncontrolled diabetes mellitus. The patient was evaluated by  Dr. Randol Kern and she was initially placed on Keflex for lower extremity cellulitis. Her Levemir was increased from 30 units subcutaneous at bedtime to 20 units subcutaneous twice a day. Today had cellulitis, it is worse, and also the patient is nauseated and vomiting and having diarrhea. As the patient was unable to tolerate p.o. with nausea, vomiting, and diarrhea, we had requested the psychiatry service is to discharge the patient from their service in order to admit to medical service.   PAST MEDICAL HISTORY: Rheumatid arthritis, congestive heart failure diastolic, obstructive sleep apnea, hypothyroidism, anxiety, depression, neurogenic bladder, diabetes mellitus, morbid obesity with BMI of 58.   PAST SURGICAL HISTORY: Pacemaker placement, hernia repair, cholecystectomy, tonsillectomy, partial hysterectomy and bladder sling surgery.  ALLERGIES: CODEINE, DARVOCET, HYDROCODONE, SULFA.   SOCIAL HISTORY: Quit smoking more than 10 years ago. Denies alcohol or other illicit drug usage. Lives at home with daughter.   FAMILY HISTORY: Heart conditions, diabetes and hypertension runs in her family.   REVIEW OF SYSTEMS:  CONSTITUTIONAL: Denies any fever, chills, fatigue or weakness.  EYES: Denies blurry vision,  double vision.  ENT: Denies any tinnitus, ear pain, hearing loss, or epistaxis. RESPIRATORY: No cough, COPD, or obstructive sleep apnea.  CARDIOVASCULAR: Denies any chest pain or shortness of breath.  GASTROINTESTINAL: Complaining of nausea, vomiting and diarrhea. Denies any abdominal pain.  GENITOURINARY: Denies any dysuria, hematuria or colic.  ENDOCRINE: No polyuria, nocturia, heat or cold intolerance.  HEMATOLOGY AND LYMPHATIC: No anemia, easy bruising or bleeding.   INTEGUMENTARY: No acne, rash, or lesions except for lower extremity swelling and erythema, which is getting worse bilaterally.  MUSCULOSKELETAL: Denies any cramping or swelling. No back pain. Has history of rheumatoid arthritis.  NEUROLOGIC: Denies any history of tremors, vertigo, ataxia, TIA, CVA.  PSYCHIATRIC: Has a history of anxiety, insomnia, and depression.   HOME MEDICATIONS: Metolazone 2.5 mg 3 times a week,  Victoza dose unknown subcutaneously daily, folic acid 1 mg p.o. b.i.d., metformin 500 mg 3 times a day, vitamin D3 1000 international units once daily, tamsulosin 0.4 mg daily,  potassium 20 mEq p.o. b.i.d., gabapentin 500 mg 3 times a day, Advair inhalation twice a day, NovoLog 10 units 2 times a day, Levemir sq q daily, ketoconazole cream twice a day, levothyroxine 88 mcg once daily., Prozac 20 mg 4 times a day,  Percocet 5/325 p.o  as needed, trazodone 200 mg p.o. at bedtime.   PHYSICAL EXAMINATION:  VITAL SIGNS: Temperature 98.4, pulse 90, respirations 18, blood pressure 106/69, pulse oximetry 93% on room air.  GENERAL APPEARANCE: Not in acute distress, moderately built and obese.  HEENT: Normocephalic, atraumatic. Pupils are equally reactive to light and accommodation. No scleral icterus. No conjunctival injection. No sinus tenderness. Moist mucous membranes.  NECK: Supple. No JVD. No thyromegaly. Range of motion is intact.  LUNGS: Clear to auscultation bilaterally and oximetry is  98% .  CARDIAC: S1, S2  normal. Regular rate and rhythm. No murmur.  GASTROINTESTINAL: Soft, obese. Bowel sounds are positive in all 4 quadrants. Nontender, nondistended. No hepatosplenomegaly. No masses. NEUROLOGIC: Awake, alert and oriented x 3. Motor and sensory are grossly intact. Reflexes are 2+.  EXTREMITIES: Bilateral lower extremities are erythematous and edematous, tender to touch. No weeping ulcers. No pustules. No cyanosis. No clubbing. Peripheral pulses are 1+.  PSYCHIATRIC: Normal mood and affect.   LABORATORIES AND IMAGING STUDIES: Glucose 275, BUN 12, creatinine 0.9, sodium 132, potassium 3.3, chloride 96, CO2 of 28. GFR greater than 60. Anion gap is 6. Serum osmolality 274, calcium is 9.8, magnesium is 1.1. Hemoglobin A1c of 13.1.   Bilateral lower extremity venous Dopplers: No evidence of DVT. This study was performed on August 15.   ASSESSMENT AND PLAN: 54 yr old markedly obese female admitted to the psychiatric unit initially will be transferred to the medical telemetry unit for intractable nausea, vomiting, diarrhea and worsening of bilateral lower extremity cellulitis.    1. Acute gastroenteritis. We will keep her on a clear liquid diet. We will provide her Pepcid IV. I will provide Zofran and Phenergan alternatively for nausea. Check stool studies including stool for Hemoccult.  2. Worsening of bilateral lower extremity cellulitis. p.o. Keflex was discontinued. We will provide her IV clindamycin with Florastor. BL lower extremity venous Dopplers are negative for deep vein thrombosis.  3. Hypomagnesemia and hypokalemia. We will replace electrolytes.  4. Dehydration. We will provide gentle hydration with IV fluids.  5. Uncontrolled diabetes mellitus . The patient's Levemir is increased from 30 units subcutaneous at bedtime to 30 units  twice a day, but as she is nauseated and vomiting, we will hold off on the current dose of Levemir and give her only 10 units for basal coverage, and the patient will be  on high-dose sliding scale. Outpatient diabetic clinic consult is placed.  6. Congestive heart failure, not fluid overloaded at this time. We will provide gentle hydration with 1 liter of IV fluid.  7. Hypothyroidism. We will continue Synthroid once the patient starts tolerating p.o.  8. Hyperlipidemia. Hold p.o. medications for anxiety and depression. We will put a consult to psychiatric for continuation of management.  9. Of note, nausea and vomiting is probably from gastroparesis from diabetes, which is uncontrolled.  10. We will provide gastrointestinal prophylaxis.  11. Deep vein thrombosis prophylaxis will be provided with heparin subcutaneous. We will check a.m. laboratories.   The plan of care was discussed with the patient.   TOTAL TIME SPENT ON THE ADMISSION: 45 minutes.    ____________________________ Ramonita Lab, MD ag:JT D: 12/02/2013 14:31:53 ET T: 12/02/2013 15:47:39 ET JOB#: [id number]  cc: Ramonita Lab, MD, <Dictator> Ramonita Lab MD ELECTRONICALLY SIGNED 12/06/2013 16:20

## 2014-08-10 NOTE — Consult Note (Signed)
PATIENT NAME:  [name] [name], [name] [name] MR#:  [id number] DATE OF BIRTH:  09/14/1960  DATE OF CONSULTATION:  11/30/2013  REFERRING PHYSICIAN:  Dr. Ardyth Harps  CONSULTING PHYSICIAN:  Starleen Arms, MD  PRIMARY CARE PHYSICIAN: Dr. Ulanda Edison.  REASON FOR CONSULTATION: Uncontrolled diabetes, worsening lower extremity erythema.   HISTORY OF PRESENT ILLNESS: This is a 54 year old female with known history of morbid obesity, rheumatoid arthritis, diastolic congestive heart failure, hypothyroidism, obstructive sleep apnea, diabetes mellitus, she is admitted under psychiatric service due to depression, hospitalists were requested to evaluate the patient for uncontrolled diabetes mellitus, and worsening of her lower extremity erythema. The patient is known to have history of diabetes, and she is on Levemir,  insulin, NovoLog before meals as well as metformin, despite that the patient remains to have uncontrolled blood sugar, which has been running in the 300s all day long today. The patient was started yesterday back on NovoLog 10 units before meals, as well she is back on her Levemir 30 units at bedtime. As well, the patient is known to have chronic lower extremity erythema, but has been worsening over the last 24 hours as well. The patient denies any fever, any chills, any chest pain, productive sputum, dysuria, does not have any leukocytosis as well. The patient is known to have history of rheumatoid arthritis. Has been taking prednisone as needed, but has not been on any large dose of steroids recently.   PAST MEDICAL HISTORY:  1. Rheumatoid arthritis.  2. Congestive heart failure, diastolic.  3. Morbid obesity, BMI 58.  4. Obstructive sleep apnea. 5. Hypothyroidism.  6. Anxiety and depression.  7. Neurogenic bladder.  8. Diabetes mellitus.   PAST SURGICAL HISTORY:  1. Bladder sling surgery.  2. Pacemaker.  3. Hernia repair.  4. Cholecystectomy.  5. Tonsillectomy and adenoidectomy.  6. Partial  hysterectomy.   ALLERGIES: CODEINE, DARVOCET, HYDROCODONE, SULFA.   SOCIAL HISTORY: The patient quit smoking more than 10 years ago. No alcohol. No illicit drug use. Lives at home with her daughter.   FAMILY HISTORY: Significant for heart disease, diabetes and hypertension.   REVIEW OF SYSTEMS: CONSTITUTIONAL: Denies fever, chills, fatigue, weakness.  EYES: Denies blurry vision, double vision or inflammation.  EARS, NOSE AND THROAT: Denies tinnitus, ear pain, hearing loss, epistaxis.  RESPIRATORY: Denies cough, wheezing, hemoptysis or COPD.  CARDIOVASCULAR: Denies chest pain, orthopnea, edema, arrhythmia.  GASTROINTESTINAL: Denies nausea, vomiting, diarrhea, abdominal pain, hematemesis.  GENITOURINARY: Denies dysuria, hematuria, renal colic.  ENDOCRINE: Denies polyuria, polydipsia, heat or cold intolerance.  HEMATOLOGY: Denies anemia, easy bruising, bleeding.  INTEGUMENTARY: Reports worsening lower extremity erythema, but it is chronic at baseline.  MUSCULOSKELETAL: Denies any cramps, swelling, gout, but reports history of arthritis, with some joint ache currently.  NEUROLOGIC: Denies any history of CVA, TIA, tremors, vertigo or ataxia.  PSYCHIATRIC: Reports history of  anxiety, insomnia and depression.   HOME MEDICATIONS:  1. Metolazone 2.5 mg 3 times a week.  2. Lovastatin 40 mg daily.  3. Victoza subcutaneous daily. 4. Folic acid 1 mg oral 2 times a day. 5. Metformin 500 mg 3 times a day. 6. Vitamin D3 1000 units daily.  7. Tamsulosin 0.4 mg daily.  8. Abilify.  9. Potassium 20 mEq 2 times a day.  10. Gabapentin 500 mg oral 3 times a day.  11. Prednisone 5 mg 1 to 2 tablets a day as needed for pain.  12. Trazodone 200 mg oral at night.  13. Percocet 5/325 mg 3 times a day as  needed.  14. Phenergan 25 as needed.  15. Prozac 20 mg 4 tablets daily.  16. Torsemide 20 mg 3 tablets b.i.d.  17. Levothyroxine 88 mcg daily.  18. Ketoconazole cream twice daily.  19. Combivent as  needed.  20. Advair 1 puff b.i.d.  21. NovoLog insulin 10 units 2 times a day.  22. Levemir 30 units at bedtime.   PHYSICAL EXAMINATION:  VITAL SIGNS: Temperature 98.1, pulse 80, respiratory rate 16, blood pressure 121/77 and saturation 93% on room air.  GENERAL: Morbidly obese female who looks comfortable, in no apparent distress.  HEENT: Head atraumatic, normocephalic. Pupils equal, reactive to light. Pink conjunctivae. Anicteric sclerae. Moist oral mucosa. No oral thrush.  NECK: Supple. No thyromegaly. No JVD.  CHEST: Good air entry bilaterally. No wheezing, rales or rhonchi. No use of accessory muscle.  CARDIOVASCULAR: S1, S2 heard. No rubs, murmur or gallops. PMI nondisplaced.  ABDOMEN: Soft, nontender, nondistended. Bowel sounds present.  EXTREMITIES: No edema. No clubbing. No cyanosis. Pedal pulses +2 bilaterally. Has bilateral lower extremity chronic skin changes and erythema at the bases.  PSYCHIATRIC: Appropriate affect. Awake, alert x 3. Intact judgment and insight.  NEUROLOGIC: Cranial nerves grossly intact. No focal deficits.  MUSCULOSKELETAL: No joint effusion or erythema, but has mild generalized skin tenderness to palpation.  LYMPHATIC: No cervical or supraclavicular lymphadenopathy.   PERTINENT LABORATORY DATA: As of August 13, glucose 364, BUN 7, creatinine 0.75, sodium 137, potassium 3.7, chloride 100, CO2 of 27. White blood cells 7.2, hemoglobin 13.4, hematocrit 42.2, platelet 268. Negative for leukocyte esterase and nitrite.   ASSESSMENT AND PLAN:  1. Uncontrolled diabetes mellitus, we will continue her on her metformin. We will change her Levemir from 30 at bedtime to 20 units twice daily before breakfast and before supper. Will change her standing dose NovoLog before meals to high-dose insulin sliding scale as well. The patient was counseled about diet as well and if it remains uncontrolled we can increase her Levemir.   2. Bilateral lower extremity mild cellulitis. The  patient is afebrile, has no leukocytosis. Has chronic changes, but worsening, so we will start her on p.o. Keflex.  3. Congestive heart failure, diastolic, appears to be compensated at this point. The patient torsemide dose sounds too high, so we will hold her torsemide for now, as it apparently, she is most likely to be Lasix and not torsemide. We will continue her on metolazone. We will check CBC BMP in the morning to check her potassium level and creatinine.  4. Hypothyroidism. Continue with Synthroid.  5. Hyperlipidemia. Continue with statin.  6. Anxiety and depression as per psychiatric team.  7. Deep vein thrombosis prophylaxis. Sequential compression devices and  TED hose.   TOTAL TIME SPENT ON MEDICAL CONSULT:  55 minutes.     ____________________________ Starleen Arms, MD dse:JT D: 11/30/2013 18:42:00 ET T: 12/01/2013 00:54:39 ET JOB#: [id number]  cc: Starleen Arms, MD, <Dictator> Sadonna Kotara Teena Irani MD ELECTRONICALLY SIGNED 12/06/2013 4:07

## 2014-08-10 NOTE — Discharge Summary (Signed)
PATIENT NAME:  [name] [name], ROADCAP MR#:  [id number] DATE OF BIRTH:  1961/01/22  DATE OF ADMISSION:  06/25/2013 DATE OF DISCHARGE:  07/04/2013  ADMITTING DIAGNOSIS: Generalized swelling all over her body,  severe pain, difficulty with movement of her hands and legs.   DISCHARGE DIAGNOSES: 1. Severe rheumatoid arthritis flare-up. Treated with IV Solu-Medrol now improved. The patient will be followed as an outpatient for further treatment.  2. Severe hyperglycemia requiring insulin drip during hospitalization due to the steroid therapy, seen by endocrinology. Blood glucose improved.  3. genralized weakness deconditioning 4. Volume overload with lower extremity swelling, treated with diuretics.  5. Abnormal urinalysis felt to be due to contamination with Candida and gram-positive rods, not on any antibiotics.  6. Morbid obesity.  7. Sleep apnea.  8. Hypothyroidism.  9. Hyperlipidemia.  10. Diarrhea during hospitalization, which now resolved.   CONSULTANTS: Dr. Gavin Potters, as well as Dr. Tedd Sias.   PERTINENT LABS AND EVALUATIONS: Admitting glucose 246, BUN 28, creatinine 1.0, sodium 133, potassium 3.7, chloride 96, CO2 is 29, calcium 9.1, stool for C. difficile was negative. Stool cultures negative for salmonella. No pathogens isolated. C. difficile negative. Urine cultures showed greater than 100,000 Candida glabrata.   EKG showed normal sinus rhythm without any ST-T wave changes.   HOSPITAL COURSE: Please refer to H and P done by the admitting physician. The patient is a 54 year old Caucasian female with a history of rheumatoid arthritis who follows up with Dr. Gavin Potters.  Apparently, the patient was on treatment for rheumatoid arthritis, but had lost her insurance coverage and could not get done, so she presented with complaint of swelling all over her body and significant pain. She was thought to have acute rheumatoid arthritis flare. She was treated with IV steroids and also seen by and Dr. Gavin Potters.  In terms of her joint pains and swelling they have significantly improved. However, because she was started on IV Solu-Medrol her sugar started going into the 600 range. She had to be placed on IV insulin drip and was seen by endocrinology. The patient now had to be started on insulin. Her blood sugars are now improved. She was seen in consultation by endocrinology. She will follow up outpatient with them as well. The patient also had some swelling and was thought to have fluid overload. She did have an echocardiogram March of last year which showed a normal echocardiogram without any diastolic or systolic congestive heart failure. There is no evidence of congestive heart failure based on her chest x-ray. She was treated briefly for diuresis, but there was no evidence of congestive heart failure exacerbation. She also had some hyponatremia, likely due to diuretic therapy, for which she received one dose of tolvaptan. At this time, she is doing much better and is stable for discharge.   DISCHARGE MEDICATIONS: Abilify daily, metformin 500 mg 1 tab p.o. b.i.d., levothyroxine 88 mcg daily, trazodone 100, 2 tabs at bedtime, Flomax 0.4 daily, gabapentin 300 mg 1 tab p.o. t.i.d., folic acid 1 mg p.o. b.i.d., Prozac 20 mg 1 tab p.o. 4 times a day, Advair 1 puff b.i.d., vitamin D3, 1000 international units daily, ketoconazole topically applied to affected area 2 to 3 times a day as needed,  1 puff inhalation 3 times a day, methotrexate 1 mL every week on Monday, lovastatin 40 at bedtime, KCl 20 mEq 1 tab p.o. b.i.d., promethazine 25 mg 1 tab p.o. t.i.d., Percocet 5/325 mg 1 tab p.o. t.i.d., prednisone taper starting at 60 mg taper x 5  mg until complete, insulin aspartate 10 units t.i.d. prior to each meal Levemir 30 units at bedtime. The patient is told to hold metolazone and torsemide until seen by her primary care provider.   DIET: Low-sodium, low-fat, low-cholesterol.   FOLLOWUP: With primary M.D. in 5 to 7 days.  Follow  with Dr. Gavin Potters in 1 to 2 weeks. Follow with Dr. Tedd Sias in 2 to 4 weeks.   The patient is to keep a log of her blood glucose, at least checked 3 times a day. Also check blood pressure every morning and keep a log. The patient told to stop her fluid pills until seen by primary care provider including metolazone and torsemide.   TIME SPENT: 35 minutes.    ____________________________ Lacie Scotts Allena Katz, MD shp:sg D: 07/05/2013 10:33:20 ET T: 07/05/2013 11:24:37 ET JOB#: [id number]  cc: Laquida Cotrell H. Allena Katz, MD, <Dictator> Charise Carwin MD ELECTRONICALLY SIGNED 07/06/2013 8:14

## 2014-08-10 NOTE — Discharge Summary (Signed)
PATIENT NAME:  [name] [name], [name] [name] MR#:  [id number] DATE OF BIRTH:  1961/03/29  ADMITTING DIAGNOSES: 1.  Bilateral lower extremity cellulitis. 2.  Intractable nausea, vomiting, and diarrhea.   DISCHARGE DIAGNOSES:  1.  Bilateral lower extremity cellulitis, improved. Discharged home with clindamycin.  2.  Acute gastroenteritis, probably viral, which has resolved. 3.  Hypomagnesemia and hypokalemia, repleted.  4.  Uncontrolled diabetes mellitus. The patient's Levemir is increased to 33 units subcutaneously twice a day.    PRIMARY CARE PHYSICIAN:  Lucillie Garfinkel. Mayford Knife, MD  BRIEF HISTORY AND PHYSICAL AND HOSPITAL COURSE:  The patient is a 54 year old morbidly obese female with history of insulin-dependent diabetes mellitus, was admitted to behavioral medicine prior to this admission for acute depression, and hospitalist was consulted regarding lower extremity redness and uncontrolled diabetes mellitus. The patient was initially started on Keflex p.o. for lower extremity cellulitis and her Levemir dose was increased for diabetic management.  The patient was getting 30 units subcutaneously every bedtime, which was increased to 20 units subcutaneously twice a day. Please review history and physical for details. However, the patient's cellulitis was not significantly improved with Keflex, that was changed to clindamycin with no significant improvement either. The patient was having nausea, vomiting, and diarrhea. At that point, she was discharged from behavioral medicine unit and admitted to medical services on August 16th.  The patient was started on IV clindamycin. She was made n.p.o. and provide IV fluids. She was provided with antinausea medications and proton pump inhibitor. Her nausea and vomiting are significantly improved and stool for C. difficile was negative. Stool for salmonella was also negative. Acute gastroenteritis was resolved. With IV clindamycin her bilateral lower extremity cellulitis is  significantly improved. For uncontrolled diabetes mellitus, Levemir is increased to 33 units subcutaneously twice a day. Overall condition is improved and the decision is made to discharge the patient home.    The patient was evaluated by psychiatry regarding hospital admission and they have recommended to discharge the patient home with Prozac 80 mg once daily, Abilify, trazodone, and Neurontin.  Electrolyte deficits were replaced.   The patient is discharged home in a stable condition. Stool for Hemoccult was positive only one time, but the patient did not notice any other episodes of blood. The patient was suggested to closely monitor her stools and follow up with primary care physician in case if she notices any kind of blood in future.   ACTIVITY: As tolerated.   DIET: Diabetic diet, low fat, low cholesterol, monitor daily weights in view of congestive heart failure.   DISCHARGE MEDICATIONS: Percocet 5/325 1 tablet p.o. 3 times a day as needed for pain, tamsulosin 0.5 mg 1 capsule once daily for urinary retention, metformin 1000 mg p.o. b.i.d., NovoLog sliding scale, promethazine 25 mg 1 tablet p.o. 3 times a day for nausea, and lovastatin 40 mg p.o. at bedtime, methotrexate  1 mL injectable once a week on Monday for rheumatoid arthritis.  Torsemide 20 mg 3 tablets p.o. 2 times a day, potassium chloride 20 mEq p.o. once daily, levothyroxine 88 mcg p.o. once daily, ketoconazole topical 2% cream, apply to the affected area 2-3 times  a day as needed, folic acid 1 mg p.o. once daily, vitamin D3, 1000 International Units 1 tablet p.o. once daily for vitamin D deficiency. Gabapentin 300 mg 1 capsule p.o. 3 times a day, Prozac 20 mg 4 capsules p.o. once a day for depression, trazodone 100 mg 2 tablets p.o. at bedtime for sleep, clindamycin 600 mg  p.o. q. 8 hours for 5 more days, lactobacillus 1 capsule p.o. 2 times a day for 5 more days, Levemir 33 units subcutaneously 2 times a day.  FOLLOWUP:  With  primary care physician in 1 week, psychiatry in 1-2 weeks. Outpatient follow up with [facility]abetic [facility] was advised.   LABORATORY DATA AND IMAGING STUDIES:  Bilateral lower extremity venous Dopplers. No evidence of DVT. On August 17th, glucose 193, sodium 140, potassium 3.4, GFR greater than 60. Hemoglobin A1c 13.1. Total cholesterol 136, HDL is 28, LDL is 55. Stool for C. difficile toxin is negative and salmonella, no other pathogens. Stool for occult blood is positive x 1.  Plan of care was discussed in detail with the patient. She is agreeable with the plan.   Total time spent on the discharge is 40 minutes.    ____________________________ Ramonita Lab, MD ag:LT D: 12/04/2013 15:46:07 ET T: 12/04/2013 19:11:37 ET JOB#: [id number]  cc: Ramonita Lab, MD, <Dictator> Ramonita Lab MD ELECTRONICALLY SIGNED 12/06/2013 16:23

## 2014-08-10 NOTE — Consult Note (Signed)
Brief Consult Note: Comments: consider OT and PT for her hand and ankle pain. consider letting her take her usual dose of injectable methorexate this week.  Electronic Signatures: Royann Shivers., Helen Hashimoto (MD)  (Signed 12-Mar-15 18:10)  Authored: Brief Consult Note   Last Updated: 12-Mar-15 18:10 by Royann Shivers., Helen Hashimoto (MD)

## 2014-08-10 NOTE — Consult Note (Signed)
PATIENT NAME:  [name] [name], [name] [name] MR#:  [id number] DATE OF BIRTH:  May 19, 1960  DATE OF CONSULTATION:  11/29/2013  REFERRING PHYSICIAN:   CONSULTING PHYSICIAN:  Audery Amel, MD  IDENTIFYING INFORMATION AND REASON FOR CONSULT: This is a 54 year old woman with a history of major depression who is being admitted to the psychiatry ward voluntarily from my outpatient clinic.   CHIEF COMPLAINT: " I don't know what to do."   HISTORY OF PRESENT ILLNESS: Information obtained from the patient and the old chart. This 54 year old woman has a history of major depression and had previously been seen by me in my outpatient clinic. She has not been seen in over a year. She returns today stating that she had been away from treatment with me because of not having Medicaid for an extended period of time. At the moment, she is reporting her depression symptoms have come back extremely severe within the last week. Mood has suddenly become extremely sad. She is tearful much of the time. Energy level is poor with no enjoyment of anything. She is reporting suicidal thoughts with the feeling that her life is worthless and hopeless, and she has no reason to live. She is not reporting psychotic symptoms. She has continued taking fluoxetine at her previous dose, but has not been on her Abilify in about a month. Other medical problems have persisted and, if anything, worsened over the last year. Major stresses in her life do not sound otherwise like they have changed significantly. She continues to live with her adult daughter and to be a very lonely and feeling isolated with lots of financial stress as well.   PAST PSYCHIATRIC HISTORY: Long history of recurrent bouts of depression. No history of mania. Positive for previous hospitalizations. No clear suicide attempts. The patient has responded to high doses of fluoxetine combined with Abilify, as of the last time I was treating her. Has been on multiple antidepressants.    SUBSTANCE ABUSE HISTORY: No current or past significant substance abuse.   PAST MEDICAL HISTORY: Overweight woman has rheumatoid arthritis, also has COPD,  diabetes, chronic pain. She sees a rheumatologist regularly.   SOCIAL HISTORY:  Her husband left her a few years ago. She had a terrible time getting over that and has been depressed and sad most of the time since then. Not working. Lives with her adult daughter.   FAMILY HISTORY: Positive for depression.   REVIEW OF SYSTEMS: Depressed mood. Tearfulness. Hopelessness and helplessness. Lack of enjoyment. Thoughts of not wanting to " be here. " Denies hallucinations. Chronic joint pain.   CURRENT MEDICATIONS: Methotrexate 25 mg inject 1 mL once a week, metolazone 2.5 mg 3 times a week, lovastatin 40 mg once a day, Victoza Pen 18 mg subcutaneous once a day, folic acid 1 mg twice a day, metformin 500 mg twice a day, vitamin D3 1000 units once a day, tamsulosin 0.4 mg once a day, Abilify which she has not recently been on, potassium 20 mEq twice a day, gabapentin 300 mg 3 times a day, prednisone 5 mg 1-2 a day as needed for her pain, trazodone 200 mg at night, Percocet 5 mg 3 times a day, Phenergan 25 mg 3 times a day, Prozac 20 mg 4 of them a day, torsemide 20 mg 3 tablets twice a day for a total of 60 mg twice a day, levothyroxine 88 mcg once a day, Ketoconazole  cream twice a day to rash, Combivent inhaler 3 times a day, Advair inhaler twice  a day, NovoLog insulin 10 units 3 times a day, Levemir  insulin 30 units at bedtime.   ALLERGIES: CODEINE, DARVOCET, HYDROCODONE AND SULFA DRUGS.   MENTAL STATUS EXAMINATION: Neatly dressed woman. Affect is tearful. She is crying before I even started to see her. Eye contact poor. Psychomotor activity slow. Speech slow and limited in amount. Mood is depressed. Thoughts are slow and extremely negative. Somewhat disorganized and hard to come to a decision.  Positive suicidal ideation without specific plan. No  homicidal ideation. No hallucinations. Alert and oriented x 4. Memory 2 out of 3 objects at 3 minutes. Normal fund of knowledge.   LABORATORY RESULTS: Nothing in his back at this point. I have just lab orders.   ASSESSMENT: A 54 year old woman with severe, recurrent major depression. Presented to my office after over a year away from treatment with me. She presents with multiple severe symptoms of depression including suicidal ideation. I was unable to effectively get her to discuss a safety plan. She and I decided together that admission to the hospital seemed like the safest route. She was agreeable to coming down stairs for voluntary admission.   TREATMENT PLAN: I have admitted her to the behavioral health ward. Continue all current medicines. Restart the Abilify 5 mg a day. Glucometer checks ordered. Suicide precautions ordered. The patient will be assigned one of the primary psychiatrist on the ward. I can follow up after discharge.   DIAGNOSIS, PRINCIPAL AND PRIMARY:  AXIS I: Major depression, severe, recurrent.   SECONDARY DIAGNOSES:  AXIS I: No further.  AXIS II: Deferred.  AXIS III: Rheumatoid arthritis, overweight, diabetes, hypertension, chronic obstructive pulmonary disease, hypothyroidism.  AXIS IV: Severe, social isolation and illness.  AXIS V: Functioning at time of admission 30.    ____________________________ Audery Amel, MD jtc:TT D: 11/29/2013 17:14:10 ET T: 11/29/2013 18:12:15 ET JOB#: [id number]  cc: Audery Amel, MD, <Dictator> Audery Amel MD ELECTRONICALLY SIGNED 12/28/2013 16:57

## 2014-08-10 NOTE — Consult Note (Signed)
Brief Consult Note: Patient was seen by consultant.   Consult note dictated.   Comments: 1 RA ,s/p Methotrexate, enbrel loss of response, humira complicated by CHF, good response to orencia but stopped due loss of insurance, now with flare in hands and ankles , improved on steriod  2 significant hyperglycemia  Rec: d/c IV steriods, prednisone 60 mg and slow taper, fup with her Rheumatologist in [place].  Electronic Signatures: Royann Shivers., Helen Hashimoto (MD)  (Signed 10-Mar-15 16:47)  Authored: Brief Consult Note   Last Updated: 10-Mar-15 16:47 by Royann Shivers., Helen Hashimoto (MD)

## 2014-08-10 NOTE — Consult Note (Signed)
Brief Consult Note: Diagnosis: major depression.   Patient was seen by consultant.   Consult note dictated.   Recommend further assessment or treatment.   Orders entered.   Comments: PSychiatry: Direct admit patient from my office with recurrent major depression and suicidal ideation. Full note dictated. Orders done.  Electronic Signatures: Audery Amel (MD)  (Signed 13-Aug-15 16:34)  Authored: Brief Consult Note   Last Updated: 13-Aug-15 16:34 by Audery Amel (MD)

## 2014-08-27 NOTE — H&P (Signed)
PATIENT NAME:  [name] [name], LAGUNA [id number] OF BIRTH:  [birth year]   IDENTIFYING INFORMATION :This is a 54 year old woman with a history of major depression who is being admitted to the psychiatry ward voluntarily from  outpatient clinic last night.  COMPLAINT: " I don?t know what to do.?  OF PRESENT ILLNESS:  This 54 year old woman has a history of major depression who is followed up by Dr. Toni Amend.  She had not been seen in the clinic for over a year. She returned yesterday stating that she had been away from treatment  because of not having Medicaid for an extended period of time.  She is reporting her depression symptoms have come back extremely severe within the last week. Mood has suddenly become extremely sad. She is tearful much of the time. Energy level is poor with no enjoyment of anything. She is reporting suicidal thoughts with the feeling that her life is worthless and hopeless, and she has no reason to live. She is not reporting psychotic symptoms. She has continued taking fluoxetine at her previous dose, but has not been on her Abilify in about a month. Other medical problems have persisted over the last year. No currents stressors were reported. She continues to live with her adult daughter and to be a very lonely and feeling isolated with lots of financial stress as well.  PSYCHIATRIC HISTORY: Long history of recurrent bouts of depression. No history of mania. Positive for previous hospitalizations. No history of suicide attempts. The patient has responded to high doses of fluoxetine combined with Abilify, as of the last time I was treating her. Has been on multiple antidepressants.  ABUSE HISTORY: No current or past significant substance abuse.  MEDICAL HISTORY: Overweight woman has rheumatoid arthritis, also has COPD,  diabetes, chronic pain hypothyroidism and OSA. She sees a rheumatologist regularly.  HISTORY:  Her husband left her a few years ago. She had a terrible time getting over that and  has been depressed and sad most of the time since then. Not working. Lives with her adult daughter. Receives disability for RA HISTORY: Positive for depression.   MEDICATIONS PER CONSULT BY DR CLAPACS: Methotrexate 25 mg inject 1 mL once a week, metolazone 2.5 mg 3 times a week, lovastatin 40 mg once a day, Victoza Pen 18 mg subcutaneous once a day, folic acid 1 mg twice a day, metformin 500 mg twice a day, vitamin D3 1000 units once a day, tamsulosin 0.4 mg once a day, Abilify which she has not recently been on, potassium 20 mEq twice a day, gabapentin 300 mg 3 times a day, prednisone 5 mg 1-2 a day as needed for her pain, trazodone 200 mg at night, Percocet 5 mg 3 times a day, Phenergan 25 mg 3 times a day, Prozac 20 mg 4 of them a day, torsemide 20 mg 3 tablets twice a day for a total of 60 mg twice a day, levothyroxine 88 mcg once a day, Ketoconazole  cream twice a day to rash, Combivent inhaler 3 times a day, Advair inhaler twice a day, NovoLog insulin 10 units 3 times a day, Levemir  insulin 30 units at bedtime.  THIS LIST IS DIFFERENT FROM HOME LIST PER MED RECONCILIATION. Glipizide 10 mg bid not started, levir dose is different 20 unit bid, per med rec no victoza, metformin higher dose of 1000mg  bid.  ALLERGIES: CODEINE, DARVOCET, HYDROCODONE AND SULFA DRUGS.   MENTAL STATUS EXAMINATION:  obese white female, laying in bed with hospital clothespleasant and cooperative CONTACT:  good   eye contactdecreased tone, volume and rateACTIVITY: retardedPROCESS: slowedCONTENT: neg for SI, HI or A/V Hdysphoric bluntedfairfair OF SYSTEMS:no weight loss, fever, chills, weakness or fatigue.no visual loos, blurred vision, double vision or yellow sclerae.  Ears, nose and throat: no hearing loos, sneezing, congestion, runny nose or sore throat.no rash or itchingno chest pain, chest pressure or discomfort.  No palpitations or edema.no shortness of breath, cough or sputum.no anorexia, nausea, vomiting or diarrhea. No  abdominal pain or blood.no burning on urination.bilateral knee pain that started yesterday, unable to walk due to RA flare upno anemia, bleeding or bruising.no enlarged nodes. No h/o splenectomy.see history of present illness.no headache, dizziness, syncope, paralysis, ataxia, numbness or tingling in extremities.  No change in bowel or bladder control.no reports of sweating, cold or heat intolerance.  No polyuria or poly- dipsia.no history of asthma, hives, eczema or rhinitis.  SIGNS: BP: 121 /77 ,  R:16 , P:81 , T: 98.1 PHYSICAL EXAMINATION: APPEARANCE: The patient is alert, oriented.  Patient is in no acute distress. Head is normocephalic.  Pupils are equal and reactive. Supple without lymphadenopathy. Regular rate and rhythm. normal breath sounds. No crackles or wheezes are heard. Soft, nontender, nondistended with good bowel sounds heard. moderate edema bilateral, lower extremities appear infected large area of erythema warm to touch, painful NEUROLOGICAL: Gross nonfocal. [id number] LEVEL:n/aTOXICOLOGY: +opiatesPREGNANCY:n/aclear ASSESSMENT: A 54 year old woman with severe, recurrent major depression. Presented to outpatient clinic yesterday after a 1 year absence. She presented with multiple severe symptoms of depression including suicidal ideation. PRINCIPAL AND PRIMARY: I: Major depression, severe, recurrent.  DIAGNOSES: I: No further. II: Deferred. III: Rheumatoid arthritis, overweight, poorly controlled diabetes, hypertension, chronic obstructive pulmonary disease, hypothyroidism, CHF, Vut D def, r/o cellulitisIV: Severe, social isolation and illness. V: Functioning at time of admission 30.   PLAN: with fluoxetine 80 mg /dwith abilify 5 mg po q dayresponded to this combination in the past200 mg po qhs for insomnia consulted as glucose is running >300500 mg po bid---this dose is different fromm home dose of 1000 mg bid18 mg sub cut /day30 unit qhs-----this order is  different from home dose 20 unit bidtakes glipizide 10 mg bid but not ordered yesterdayqac and qhschange diet to DM diet synthroid 0.088 mg/dwnl albuterol and advair D def:vit d3 1000 un/d 40 mg pain300 mg tid 60 mg bid2.5 mg/d20 meq /day 5 mg po q 12 h prn.  Unclear as to why prn will consult pharmacy and hospitalist25 mg  inj q Monday why pt on flomaxreview home meds and talk to pharmacy consulted consult for weakness and unsteady gait             Electronic Signatures: Jimmy Footman (MD) (Signed on 14-Aug-15 15:34)  Authored   Last Updated: 14-Aug-15 16:21 by Jimmy Footman (MD)

## 2014-09-03 ENCOUNTER — Other Ambulatory Visit: Payer: Self-pay | Admitting: Nurse Practitioner

## 2014-09-03 DIAGNOSIS — Z1231 Encounter for screening mammogram for malignant neoplasm of breast: Secondary | ICD-10-CM

## 2014-09-13 ENCOUNTER — Ambulatory Visit: Payer: Medicaid Other | Attending: Nurse Practitioner

## 2014-09-26 ENCOUNTER — Ambulatory Visit: Payer: Medicaid Other

## 2014-09-26 ENCOUNTER — Ambulatory Visit (INDEPENDENT_AMBULATORY_CARE_PROVIDER_SITE_OTHER): Payer: Medicaid Other | Admitting: Podiatry

## 2014-09-26 ENCOUNTER — Encounter: Payer: Self-pay | Admitting: Podiatry

## 2014-09-26 VITALS — BP 118/84 | HR 91 | Resp 17 | Ht 66.0 in | Wt 333.0 lb

## 2014-09-26 DIAGNOSIS — L03031 Cellulitis of right toe: Secondary | ICD-10-CM

## 2014-09-26 DIAGNOSIS — M79671 Pain in right foot: Secondary | ICD-10-CM | POA: Diagnosis not present

## 2014-09-26 DIAGNOSIS — L03011 Cellulitis of right finger: Secondary | ICD-10-CM

## 2014-09-26 MED ORDER — OXYCODONE-ACETAMINOPHEN 10-325 MG PO TABS: 1.0000 | ORAL_TABLET | Freq: Four times a day (QID) | ORAL | Status: DC | PRN

## 2014-09-26 MED ORDER — CLINDAMYCIN HCL 300 MG PO CAPS: 300.0000 mg | ORAL_CAPSULE | Freq: Three times a day (TID) | ORAL | Status: DC

## 2014-09-26 NOTE — Patient Instructions (Signed)

## 2014-09-30 NOTE — Progress Notes (Signed)
Patient ID: [name] [name], female   DOB: Oct 27, 1960, 54 y.o.   MRN: [id number]  Subjective: 54 year old female presents the office today with complaints of right big toe pain. She states that on May 28 she hit her big toe on a piece of furniture. She states that since then toes become more swollen, red and become more painful. She has noticed pus coming from around the toenail. denies any red streaking. She has had no prior treatment. Denies any systemic complaints as fevers, chills, nausea, vomiting. She states that her blood sugars remain very high.  Objective: AAO x3, NAD DP/PT pulses palpable bilaterally, CRT less than 3 seconds Protective sensation decreased Simms Weinstein monofilament On the right hallux there is tenderness to palpation overlying the toenail as well as to the distal toe. There is edema and erythema to the distal aspect of the digit from the level of the MTPJ distally. There is no ascending cellulitis. There is significant fullness present within the proximal nail border and there is pus coming from around the toenail. Remaining nails are without pathology. No other areas of tenderness to bilateral lower extremes. There is no other areas of edema, erythema, increase in warmth.  No pain with calf compression, swelling, warmth, erythema.   Assessment: 54 year old female right hallux injury with paronychia  Plan: -X-rays were obtained and reviewed with the patient.  -Treatment options discussed including all alternatives, risks, and complications -At this time, recommended partial nail removal/I&D without chemical matricectomy to the right hallux toenail due to infection. Risks and complications were discussed with the patient for which they understand and  verbally consent to the procedure. She understands that given her blood glucose in the infection present that she is at high likelihood of losing her toe. Under sterile conditions a total of 3 mL of a mixture of 2% lidocaine  plain and 0.5% Marcaine plain was infiltrated in a hallux block fashion. Once anesthetized, the skin was prepped in sterile fashion. A tourniquet was then applied. Next the right hallux nail  was excised making sure to remove the entire offending nail border. During the initial portion of the procedure upon freeing the nail borders there was a significant amount of purulence expressed from around the toenail. This area was cultured. Once the nail was removed, the area was debrided and the underlying skin was intact. The area was irrigated and hemostasis was obtained. No further purulence was expressed. A dry sterile dressing was applied. After application of the dressing the tourniquet was removed and there is found to be an immediate capillary refill time to the digit. The patient tolerated the procedure well any complications. Post procedure instructions were discussed the patient for which he verbally understood. Follow-up in one week for nail check or sooner if any problems are to arise. Discussed signs/symptoms of worsening infection and directed to call the office immediately should any occur or go directly to the emergency room. In the meantime, encouraged to call the office with any questions, concerns, changes symptoms. -Rx Clindamycin and Percocet -Dispensed surgical shoe.

## 2014-10-03 ENCOUNTER — Ambulatory Visit: Payer: Medicaid Other | Admitting: Podiatry

## 2014-10-03 ENCOUNTER — Ambulatory Visit (INDEPENDENT_AMBULATORY_CARE_PROVIDER_SITE_OTHER): Payer: Medicaid Other | Admitting: Podiatry

## 2014-10-03 VITALS — BP 115/82 | HR 90 | Resp 16

## 2014-10-03 DIAGNOSIS — L03031 Cellulitis of right toe: Secondary | ICD-10-CM

## 2014-10-03 DIAGNOSIS — Z9889 Other specified postprocedural states: Secondary | ICD-10-CM

## 2014-10-03 DIAGNOSIS — L03011 Cellulitis of right finger: Secondary | ICD-10-CM

## 2014-10-03 NOTE — Patient Instructions (Signed)
Continue soaking in epsom salts twice a day followed by antibiotic ointment and a band-aid. Can leave uncovered at night. Continue this until completely healed.  Monitor for any signs/symptoms of infection. Call the office immediately if any occur or go directly to the emergency room. Call with any questions/concerns.  

## 2014-10-03 NOTE — Progress Notes (Signed)
Patient ID: [name] [name], female   DOB: Feb 13, 1961, 54 y.o.   MRN: [id number]  Subjective: 54 year old female presents the office today one-week status post right hallux total nail avulsion secondary to infection. She's been continuing to soak the foot in Epson salt soaks twice a day followed by antibiotic ointment and a Band-Aid. She is continuing the surgical shoe was well. She states that she's been some clear drainage coming from the area. She also states of the redness around the area has decreased although does continue somewhat. She denies any red streaks. She is concerned about the way it looks. She has continued with the antibiotics. She denies any systemic complaints as fevers, chills, nausea, vomiting. Denies any calf pain, chest pain, shortness of breath. No other complaints at this time.  Objective: AAO 3, NAD DP/PT pulses palpable, CRT less than 3 seconds Protective sensation decreased with Simms Weinstein monofilament Status post right hallux total nail avulsion. On the nail bed there is a fibro-granular wound base. There is a rim of erythema around the procedure site how there is no ascending cellulitis, fluctuance, crepitus. There is a small amount of clear serous drainage however there is no purulence. There is no malodor. Overall the infection appears to be resolving. There is mild tenderness to palpation overlying the procedure site. No other open lesions or pre-ulcer lesions identified bilaterally. No pain with calf compression, swelling, warmth, erythema.  Assessment:  54 year old female with resolving infection right hallux status post total nail avulsion  Plan: -Treatment options discussed including all alternatives, risks, and complications -Finish course of antibiotics -Recommended to continue soaking in Epson salt soaks twice a day cover with antibiotic ointment and a Band-Aid. -Continue surgical shoe.  -Monitor closely for any clinical signs or symptoms of worsening  infection and directed to call the office and medially she needs her go directly to the emergency room. -Follow-up in 2 weeks or sooner if any problems are to arise. In the meantime I encouraged her to call the office with any questions, concerns, change in symptoms.

## 2014-10-14 ENCOUNTER — Other Ambulatory Visit: Payer: Self-pay

## 2014-10-17 ENCOUNTER — Ambulatory Visit: Payer: Medicaid Other | Admitting: Podiatry

## 2014-12-17 ENCOUNTER — Encounter: Payer: Self-pay | Admitting: Emergency Medicine

## 2014-12-17 ENCOUNTER — Emergency Department
Admission: EM | Admit: 2014-12-17 | Discharge: 2014-12-17 | Disposition: A | Discharge status: Home / Self Care | Payer: Medicaid Other | Attending: Student | Admitting: Student

## 2014-12-17 DIAGNOSIS — Z87891 Personal history of nicotine dependence: Secondary | ICD-10-CM | POA: Diagnosis not present

## 2014-12-17 DIAGNOSIS — I10 Essential (primary) hypertension: Secondary | ICD-10-CM | POA: Insufficient documentation

## 2014-12-17 DIAGNOSIS — E1165 Type 2 diabetes mellitus with hyperglycemia: Secondary | ICD-10-CM | POA: Diagnosis not present

## 2014-12-17 DIAGNOSIS — N390 Urinary tract infection, site not specified: Secondary | ICD-10-CM | POA: Diagnosis not present

## 2014-12-17 DIAGNOSIS — Z794 Long term (current) use of insulin: Secondary | ICD-10-CM | POA: Diagnosis not present

## 2014-12-17 DIAGNOSIS — Z792 Long term (current) use of antibiotics: Secondary | ICD-10-CM | POA: Diagnosis not present

## 2014-12-17 DIAGNOSIS — Z79899 Other long term (current) drug therapy: Secondary | ICD-10-CM | POA: Insufficient documentation

## 2014-12-17 LAB — CBC WITH DIFFERENTIAL/PLATELET
Basophils Absolute: 0.1 10*3/uL (ref 0–0.1)
Basophils Relative: 1 %
Eosinophils Absolute: 0 10*3/uL (ref 0–0.7)
Eosinophils Relative: 0 %
HCT: 42.4 % (ref 35.0–47.0)
Hemoglobin: 13.7 g/dL (ref 12.0–16.0)
Lymphocytes Relative: 20 %
Lymphs Abs: 1.5 10*3/uL (ref 1.0–3.6)
MCH: 26.3 pg (ref 26.0–34.0)
MCHC: 32.3 g/dL (ref 32.0–36.0)
MCV: 81.6 fL (ref 80.0–100.0)
Monocytes Absolute: 0.6 10*3/uL (ref 0.2–0.9)
Monocytes Relative: 7 %
Neutro Abs: 5.4 10*3/uL (ref 1.4–6.5)
Neutrophils Relative %: 72 %
Platelets: 212 10*3/uL (ref 150–440)
RBC: 5.19 MIL/uL (ref 3.80–5.20)
RDW: 17.2 % — ABNORMAL HIGH (ref 11.5–14.5)
WBC: 7.4 10*3/uL (ref 3.6–11.0)

## 2014-12-17 LAB — URINALYSIS COMPLETE WITH MICROSCOPIC (ARMC ONLY)
Bilirubin Urine: NEGATIVE
Glucose, UA: >500 mg/dL — AB
Ketones, ur: NEGATIVE mg/dL
Nitrite: NEGATIVE
Protein, ur: NEGATIVE mg/dL
Specific Gravity, Urine: 1.038 — ABNORMAL HIGH (ref 1.005–1.030)
pH: 5 (ref 5.0–8.0)

## 2014-12-17 LAB — COMPREHENSIVE METABOLIC PANEL
ALT: 79 U/L — ABNORMAL HIGH (ref 14–54)
AST: 81 U/L — ABNORMAL HIGH (ref 15–41)
Albumin: 3.5 g/dL (ref 3.5–5.0)
Alkaline Phosphatase: 113 U/L (ref 38–126)
Anion gap: 10 (ref 5–15)
BUN: 9 mg/dL (ref 6–20)
CO2: 24 mmol/L (ref 22–32)
Calcium: 9.1 mg/dL (ref 8.9–10.3)
Chloride: 101 mmol/L (ref 101–111)
Creatinine, Ser: 0.58 mg/dL (ref 0.44–1.00)
GFR calc Af Amer: >60 mL/min (ref >60)
GFR calc non Af Amer: >60 mL/min (ref >60)
Glucose, Bld: 424 mg/dL — ABNORMAL HIGH (ref 65–99)
Potassium: 3.9 mmol/L (ref 3.5–5.1)
Sodium: 135 mmol/L (ref 135–145)
Total Bilirubin: 0.8 mg/dL (ref 0.3–1.2)
Total Protein: 7.2 g/dL (ref 6.5–8.1)

## 2014-12-17 LAB — GLUCOSE, CAPILLARY: Glucose-Capillary: 428 mg/dL — ABNORMAL HIGH (ref 65–99)

## 2014-12-17 MED ORDER — NITROFURANTOIN MONOHYD MACRO 100 MG PO CAPS
100.0000 mg | ORAL_CAPSULE | Freq: Once | ORAL | Status: AC
  Administered 2014-12-17: 100 mg via ORAL
  Filled 2014-12-17: qty 1

## 2014-12-17 MED ORDER — NITROFURANTOIN MONOHYD MACRO 100 MG PO CAPS: 100.0000 mg | ORAL_CAPSULE | Freq: Two times a day (BID) | ORAL | Status: AC

## 2014-12-17 MED ORDER — SODIUM CHLORIDE 0.9 % IV BOLUS (SEPSIS)
500.0000 mL | Freq: Once | INTRAVENOUS | Status: AC
  Administered 2014-12-17: 500 mL via INTRAVENOUS

## 2014-12-17 MED ORDER — INSULIN ASPART 100 UNIT/ML ~~LOC~~ SOLN
SUBCUTANEOUS | Status: AC
  Administered 2014-12-17: 10 [IU] via SUBCUTANEOUS
  Filled 2014-12-17: qty 10

## 2014-12-17 MED ORDER — INSULIN ASPART 100 UNIT/ML ~~LOC~~ SOLN
10.0000 [IU] | Freq: Once | SUBCUTANEOUS | Status: AC
  Administered 2014-12-17: 10 [IU] via SUBCUTANEOUS

## 2014-12-17 NOTE — ED Notes (Addendum)
Disregard previous note about obtaining consent from pts daughter. Note entered in error and does not apply to [employer].

## 2014-12-17 NOTE — ED Provider Notes (Signed)
[facility] Emergency Department Provider Note  ____________________________________________  Time seen: Approximately 4:05 PM  I have reviewed the triage vital signs and the nursing notes.   HISTORY  Chief Complaint Hyperglycemia    HPI [name] [name] is a 54 y.o. female With history of diastolic CHF, rheumatoid arthritis, COPD who presents for evaluation of hyperglycemia from [place] [facility]. The patient reports she broke her glucometer approximately one month ago and has been unable to check her sugars at home. She has been giving herself "the minimum dose of insulin". She reports that for the past 2 weeks she has had increased thirst, polydipsia, polyphagia, she feels lightheaded on and off and has had intermittent blurred vision. She reports "I knew my sugar has been running high so I called my doctor today". She was seen at the [place] [facility] and her glucose was too high to read and they were unable to place an IV so she was sent to the emergency department for further evaluation and treatment. She denies any recent illness including no cough, sneezing, runny nose, congestion, vomiting, diarrhea, fevers or chills. No chest pain or difficulty breathing. Current severity of symptoms is mild. They generally improve with insulin.she is scheduled to get a new glucometer on 12/19/14. she is chronically on prednisone however there has been no recent changes in her dose.   Past Medical History  Diagnosis Date  . Anxiety   . Morbid obesity   . Depression   . Panic attacks   . GERD (gastroesophageal reflux disease)   . Fluid retention   . IBS (irritable bowel syndrome)   . Esophagitis   . Hiatal hernia   . Neurogenic bladder     has pacemaker  . Arthritis     Rheumatoid  . Neuropathy   . Sleep apnea     STATES SEVERE, CANT TOLERATE MASK- LAST STUDY YEARS AGO  . Hypothyroidism   . Diabetes mellitus     states no meds or diet restrictions  at present  . Hypertension    . Obesity   . Rheumatoid arthritis   . Diastolic CHF   . Chronic diastolic heart failure     Patient Active Problem List   Diagnosis Date Noted  . Chronic diastolic heart failure   . History of laparoscopic adjustable gastric banding, 03/20/2007.  Removed 09/19/2011. 08/04/2011  . Nausea & vomiting 08/04/2011  . Morbid obesity 08/04/2011    Past Surgical History  Procedure Laterality Date  . Tubal ligation    . Tonsillectomy    . Cholecystectomy    . Abdominal hysterectomy    . Laparoscopic gastric banding  03/20/07  . Eye surgery      bilateral cataract extraction with IOL  . Hernia repair      ventral hernia with strangulation    Current Outpatient Rx  Name  Route  Sig  Dispense  Refill  . Abatacept (ORENCIA [place])   Subcutaneous   Inject into the skin. Injection once weekly         . ARIPiprazole (ABILIFY) 5 MG tablet   Oral   Take 5 mg by mouth daily.         . busPIRone (BUSPAR) 10 MG tablet   Oral   Take 10 mg by mouth 2 (two) times daily.         . ciprofloxacin (CIPRO) 500 MG tablet   Oral   Take 1 tablet (500 mg total) by mouth 2 (two) times daily.  20 tablet   0   . clindamycin (CLEOCIN) 300 MG capsule   Oral   Take 1 capsule (300 mg total) by mouth 3 (three) times daily.   30 capsule   2   . clotrimazole-betamethasone (LOTRISONE) cream   Topical   Apply 1 application topically 2 (two) times daily. For yeast infection under stomach         . FLUoxetine (PROZAC) 20 MG capsule   Oral   Take 20 mg by mouth daily. Take four capsules by mouth daily (at one time).         . folic acid (FOLVITE) 1 MG tablet      Take two tablets to make 2mg .         . gabapentin (NEURONTIN) 300 MG capsule   Oral   Take 300 mg by mouth Three times a day.          . hydrOXYzine (ATARAX/VISTARIL) 50 MG tablet   Oral   Take 50 mg by mouth Twice daily.          . Insulin Aspart (NOVOLOG [place])   Subcutaneous   Inject into the skin. Injection twice  daily         . KLOR-CON M20 20 MEQ tablet      TAKE ONE TABLET BY MOUTH TWICE DAILY   60 tablet   0   . KLOR-CON M20 20 MEQ tablet      TAKE TWO TABLETS BY MOUTH IN THE MORNING AND ONE BY MOUTH IN THE EVENING   90 tablet   3   . levothyroxine (SYNTHROID, LEVOTHROID) 88 MCG tablet   Oral   Take 88 mcg by mouth daily before breakfast.         . metFORMIN (GLUCOPHAGE) 500 MG tablet   Oral   Take 500 mg by mouth 2 (two) times daily with a meal.         . Methotrexate, PF, 25 MG/0.4ML SOAJ   Subcutaneous   Inject into the skin once a week.         . metolazone (ZAROXOLYN) 2.5 MG tablet   Oral   Take 1 tablet (2.5 mg total) by mouth 2 (two) times a week.   30 tablet   2   . nitrofurantoin, macrocrystal-monohydrate, (MACROBID) 100 MG capsule   Oral   Take 1 capsule (100 mg total) by mouth 2 (two) times daily.   10 capsule   0   . oxyCODONE-acetaminophen (PERCOCET) 10-325 MG per tablet   Oral   Take 1 tablet by mouth every 6 (six) hours as needed for pain. MAXIMUM TOTAL ACETAMINOPHEN DOSE IS 4000 MG PER DAY   20 tablet   0   . oxyCODONE-acetaminophen (PERCOCET) 5-325 MG per tablet   Oral   Take 1 tablet by mouth every 6 (six) hours as needed. For pain         . predniSONE (DELTASONE) 10 MG tablet   Oral   Take 10 mg by mouth daily as needed.          . promethazine (PHENERGAN) 25 MG tablet   Oral   Take 25 mg by mouth every 6 (six) hours as needed. For nausea         . tamsulosin (FLOMAX) 0.4 MG CAPS   Oral   Take 0.4 mg by mouth daily.         torsemide (DEMADEX) 20 MG tablet   Oral   Take 60 mg by  mouth 2 (two) times daily.         . traZODone (DESYREL) 100 MG tablet      Takes 2 tablets daily.           Allergies Codeine; Propoxyphene; Sulfa antibiotics; Hydrocodone; and Sulfasalazine  Family History  Problem Relation Age of Onset  . Heart failure Father   . Heart disease Brother   . Heart attack Brother 79    MI s/p  stents placed    Social History Social History  Substance Use Topics  . Smoking status: Former Smoker -- 2.00 packs/day for 27 years    Types: Cigarettes    Quit date: 07/30/1999  . Smokeless tobacco: Never Used  . Alcohol Use: No    Review of Systems Constitutional: No fever/chills Eyes: + intermittent blurred vision ENT: No sore throat. Cardiovascular: Denies chest pain. Respiratory: Denies shortness of breath. Gastrointestinal: No abdominal pain.  No nausea, no vomiting.  No diarrhea.  No constipation. Genitourinary: Negative for dysuria. Musculoskeletal: Negative for back pain. Skin: Negative for rash. Neurological: Negative for headaches, focal weakness or numbness.  10-point ROS otherwise negative.  ____________________________________________   PHYSICAL EXAM:  VITAL SIGNS: ED Triage Vitals  Enc Vitals Group     BP 12/17/14 1432 124/78 mmHg     Pulse Rate 12/17/14 1432 96     Resp 12/17/14 1432 18     Temp 12/17/14 1432 98.8 F (37.1 C)     Temp Source 12/17/14 1432 Oral     SpO2 12/17/14 1432 96 %     Weight 12/17/14 1432 319 lb (144.697 kg)     Height 12/17/14 1432 5\' 6"  (1.676 m)     Head Cir --      Peak Flow --      Pain Score 12/17/14 1433 1     Pain Loc --      Pain Edu? --      Excl. in GC? --     Constitutional: Alert and oriented. Well appearing and in no acute distress. Morbidly obese. Eyes: Conjunctivae are normal. PERRL. EOMI. Head: Atraumatic. Nose: No congestion/rhinnorhea. Mouth/Throat: Mucous membranes are moist.  Oropharynx non-erythematous. Neck: No stridor.  Cardiovascular: Normal rate, regular rhythm. Grossly normal heart sounds.  Good peripheral circulation. Respiratory: Normal respiratory effort.  No retractions. Lungs CTAB. Gastrointestinal: Soft and nontender. No distention. No abdominal bruits. No CVA tenderness. Genitourinary: deferred Musculoskeletal: No lower extremity tenderness nor edema.  No joint  effusions. Neurologic:  Normal speech and language. No gross focal neurologic deficits are appreciated. No gait instability. Skin:  Skin is warm, dry and intact. No rash noted. Psychiatric: Mood and affect are normal. Speech and behavior are normal.  ____________________________________________   LABS (all labs ordered are listed, but only abnormal results are displayed)  Labs Reviewed  GLUCOSE, CAPILLARY - Abnormal; Notable for the following:    Glucose-Capillary 428 (*)    All other components within normal limits  COMPREHENSIVE METABOLIC PANEL - Abnormal; Notable for the following:    Glucose, Bld 424 (*)    AST 81 (*)    ALT 79 (*)    All other components within normal limits  CBC WITH DIFFERENTIAL/PLATELET - Abnormal; Notable for the following:    RDW 17.2 (*)    All other components within normal limits  URINALYSIS COMPLETEWITH MICROSCOPIC ([facility] ONLY) - Abnormal; Notable for the following:    Color, Urine YELLOW (*)    APPearance CLOUDY (*)    Glucose, UA >500 (*)  Specific Gravity, Urine 1.038 (*)    Hgb urine dipstick 2+ (*)    Leukocytes, UA TRACE (*)    Bacteria, UA MANY (*)    Squamous Epithelial / LPF 6-30 (*)    All other components within normal limits  URINE CULTURE   ____________________________________________  EKG  none ____________________________________________  RADIOLOGY  none ____________________________________________   PROCEDURES  Procedure(s) performed: None  Critical Care performed: No  ____________________________________________   INITIAL IMPRESSION / ASSESSMENT AND PLAN / ED COURSE  Pertinent labs & imaging results that were available during my care of the patient were reviewed by me and considered in my medical decision making (see chart for details).  SHAMAR KRACKE is a 54 y.o. female With history of diastolic CHF, rheumatoid arthritis, COPD who presents for evaluation of hyperglycemia from [place] [facility]. On exam, she  is generally well-appearing and in no acute distress. Vital signs stable, she is afebrile. She has a benign examination. She has an intact neurological examination. Suspect her symptoms of intermittent lightheadedness, blurred vision, polyuria, polydipsia oare secondary to persistent hyperglycemia in the setting of medication noncompliance. Labs here are notable for glucose elevation at 424, normal CO2, no evidence to support DKA. Mild elevations of AST and ALT at 81 and 79 respectively. Normal CBC. We'll give light IV fluids, insulin and anticipate discharge home once her glucose improves she will follow-up with her primary care doctor in the next few days.  ----------------------------------------- 5:35 PM on 12/17/2014 -----------------------------------------  Patient reports she feels much better.visual acuity is 20/50 in the right eye, 20/70 in the left eye, 20/50 with bilateral eyes. She will follow-up with an eye doctor, suspect diabetic retinopathy as the cause of her eye vision changes over the past 2 weeks. Urinalysis with likely urinary tract infection. We'll treat with Macrobid. Urine culture sent.Blood glucose improved to 336. Discussed return precautions, need for close PCP follow-up and the patient is comfortable with discharge plan. ____________________________________________   FINAL CLINICAL IMPRESSION(S) / ED DIAGNOSES  Final diagnoses:  Hyperglycemia due to type 2 diabetes mellitus  UTI (lower urinary tract infection)      Gayla Doss, MD 12/17/14 [phone]

## 2014-12-17 NOTE — ED Notes (Addendum)
Pts blood glucose via finger stick is 336.

## 2014-12-17 NOTE — ED Notes (Signed)
Dizziness, blurred vision x 2 wk.  Blood sugar high at MD office and sent here for tx.

## 2014-12-19 LAB — URINE CULTURE

## 2014-12-21 LAB — GLUCOSE, CAPILLARY: Glucose-Capillary: 332 mg/dL — ABNORMAL HIGH (ref 65–99)

## 2015-02-05 ENCOUNTER — Ambulatory Visit: Payer: Medicaid Other | Admitting: Anesthesiology

## 2015-02-14 DIAGNOSIS — N3941 Urge incontinence: Secondary | ICD-10-CM | POA: Insufficient documentation

## 2015-02-14 DIAGNOSIS — N393 Stress incontinence (female) (male): Secondary | ICD-10-CM | POA: Insufficient documentation

## 2015-02-26 ENCOUNTER — Other Ambulatory Visit: Payer: Self-pay | Admitting: Psychiatry

## 2015-02-28 ENCOUNTER — Telehealth: Payer: Self-pay

## 2015-02-28 NOTE — Telephone Encounter (Signed)
faxed info to [employer] for approved prior auth.

## 2015-02-28 NOTE — Telephone Encounter (Signed)
prior authorization approved [id number] end date 02-22-16

## 2015-02-28 NOTE — Telephone Encounter (Signed)
received a fax that prior authorization was needed for abilify 5mg 

## 2015-03-03 ENCOUNTER — Ambulatory Visit (INDEPENDENT_AMBULATORY_CARE_PROVIDER_SITE_OTHER): Payer: Medicaid Other | Admitting: Psychiatry

## 2015-03-03 ENCOUNTER — Encounter: Payer: Self-pay | Admitting: Psychiatry

## 2015-03-03 VITALS — BP 132/92 | HR 101 | Temp 97.8°F | Ht 66.0 in | Wt 308.2 lb

## 2015-03-03 DIAGNOSIS — F411 Generalized anxiety disorder: Secondary | ICD-10-CM

## 2015-03-03 DIAGNOSIS — F331 Major depressive disorder, recurrent, moderate: Secondary | ICD-10-CM

## 2015-03-03 MED ORDER — BUPROPION HCL ER (XL) 300 MG PO TB24: 300.0000 mg | ORAL_TABLET | Freq: Every day | ORAL | Status: DC

## 2015-03-03 MED ORDER — BUSPIRONE HCL 10 MG PO TABS: 10.0000 mg | ORAL_TABLET | Freq: Two times a day (BID) | ORAL | Status: DC

## 2015-03-19 ENCOUNTER — Encounter: Payer: Self-pay | Admitting: Anesthesiology

## 2015-03-19 ENCOUNTER — Ambulatory Visit: Payer: Medicaid Other | Attending: Anesthesiology | Admitting: Anesthesiology

## 2015-03-19 ENCOUNTER — Other Ambulatory Visit: Payer: Self-pay | Admitting: Anesthesiology

## 2015-03-19 VITALS — BP 130/73 | HR 76 | Temp 98.5°F | Resp 18 | Ht 65.0 in | Wt 303.0 lb

## 2015-03-19 DIAGNOSIS — G2581 Restless legs syndrome: Secondary | ICD-10-CM | POA: Insufficient documentation

## 2015-03-19 DIAGNOSIS — E669 Obesity, unspecified: Secondary | ICD-10-CM | POA: Insufficient documentation

## 2015-03-19 DIAGNOSIS — M069 Rheumatoid arthritis, unspecified: Secondary | ICD-10-CM | POA: Diagnosis not present

## 2015-03-19 DIAGNOSIS — G8929 Other chronic pain: Secondary | ICD-10-CM | POA: Insufficient documentation

## 2015-03-19 DIAGNOSIS — M810 Age-related osteoporosis without current pathological fracture: Secondary | ICD-10-CM | POA: Diagnosis not present

## 2015-03-19 DIAGNOSIS — G629 Polyneuropathy, unspecified: Secondary | ICD-10-CM | POA: Diagnosis not present

## 2015-03-19 DIAGNOSIS — M25562 Pain in left knee: Secondary | ICD-10-CM | POA: Insufficient documentation

## 2015-03-19 DIAGNOSIS — M25511 Pain in right shoulder: Secondary | ICD-10-CM | POA: Insufficient documentation

## 2015-03-19 DIAGNOSIS — M05711 Rheumatoid arthritis with rheumatoid factor of right shoulder without organ or systems involvement: Secondary | ICD-10-CM

## 2015-03-19 DIAGNOSIS — I509 Heart failure, unspecified: Secondary | ICD-10-CM | POA: Insufficient documentation

## 2015-03-19 DIAGNOSIS — M25531 Pain in right wrist: Secondary | ICD-10-CM

## 2015-03-19 DIAGNOSIS — Z87891 Personal history of nicotine dependence: Secondary | ICD-10-CM | POA: Diagnosis not present

## 2015-03-19 DIAGNOSIS — E119 Type 2 diabetes mellitus without complications: Secondary | ICD-10-CM | POA: Diagnosis not present

## 2015-03-19 DIAGNOSIS — M25532 Pain in left wrist: Secondary | ICD-10-CM | POA: Insufficient documentation

## 2015-03-19 DIAGNOSIS — R52 Pain, unspecified: Secondary | ICD-10-CM | POA: Diagnosis present

## 2015-03-19 DIAGNOSIS — M7500 Adhesive capsulitis of unspecified shoulder: Secondary | ICD-10-CM | POA: Insufficient documentation

## 2015-03-19 DIAGNOSIS — M7501 Adhesive capsulitis of right shoulder: Secondary | ICD-10-CM

## 2015-03-19 DIAGNOSIS — M25561 Pain in right knee: Secondary | ICD-10-CM | POA: Insufficient documentation

## 2015-03-19 NOTE — Patient Instructions (Signed)
Do not eat or drink 2 hours before your procedure. Please bring someone who can drive you home if possible.

## 2015-03-19 NOTE — Progress Notes (Signed)
Subjective:    Patient ID: [name] [name], female    DOB: 08-May-1960, 54 y.o.   MRN: [id number] Is patient is a pleasant and delightful 54 year old lady who comes in with multiple sources of pain Her primary pain is pain in the left wrist and she's had that for many months Her secondary pain is pain in the right shoulder which she has had for 5 years Her tertiary pain is pain in both knees again which she's had for 5 years This patient has been diagnosed with rheumatoid arthritis assist fair form of this disease and she has been treated with Arencia injections methotrexate injections and prednisone tablets  Pain intensity rating  Her subjective pain intensity rating is 65% Her pain is relieved by hot water emotions and by Percocet The pain is aggravated by all forms of activities  Pain medications She takes Percocet 10/325 one tablet every 6 hours  Other medications She takes Phenergan and hydrochlorothiazide prednisone vitamin D 3 atorvastatin BuSpar fluoxetine Gabapentin folic acid promethazine metformin lisinopril  Klor-con  Leflunomide  Tamasulocin  Allergies He is allergic to sulfa and codeine and Darvocet  Past medical history Past medical history is positive for severe rheumatoid arthritis diabetes and restless leg syndrome obesity osteoporosis peripheral neuropathy and congestive heart failure  Past surgical history Past surgical history is positive for tubal ligation partial hysterectomy and umbilical hernia repair 3 LAP-BAND surgery and subsequent removal and left hip surgery  Social and economic history This patient used to smoke 2 packs of cigarettes per day and she did that for 27 years He has discontinued smoking in 2003 She used alcohol in large quantities as a teenager but hasn't used it in her adulthood She does not use illicit drugs  Employment status Shouldn't is disabled because of the rheumatoid arthritis and receives SSI  Family history She is  currently divorced and has 2 children ages 11 and 44 and at both alive and well Her mother is alive at age 8 and she is alive and well Her father is alive but she has no contact with him  She has 3 brothers ages 80 75 and they are all alive and well She has 3 sisters one age 8 has rheumatoid arthritis;Sister age 79 is alive and well He other sister age 84 is also alive and well     HPI    Review of Systems  Constitutional: Negative.  Negative for fever, chills, diaphoresis, activity change, appetite change, fatigue and unexpected weight change.  HENT: Negative.  Negative for congestion, dental problem, drooling, ear discharge, ear pain, facial swelling, hearing loss, mouth sores, nosebleeds, postnasal drip, rhinorrhea, sinus pressure, sneezing, sore throat, tinnitus, trouble swallowing and voice change.   Eyes: Negative.  Negative for photophobia, pain, discharge, redness, itching and visual disturbance.  Respiratory: Negative.  Negative for apnea, cough, choking, chest tightness, shortness of breath and stridor.   Cardiovascular: Negative.  Negative for chest pain and palpitations.  Gastrointestinal: Negative for nausea, vomiting, abdominal pain, diarrhea, constipation, blood in stool, abdominal distention, anal bleeding and rectal pain.  Endocrine: Negative.  Negative for cold intolerance, heat intolerance, polydipsia, polyphagia and polyuria.       Patient has diabetes mellitus and takes metformin for that condition  Genitourinary: Negative.  Negative for dysuria, urgency, frequency, hematuria, flank pain, decreased urine volume, vaginal bleeding, vaginal discharge, enuresis, difficulty urinating, genital sores, vaginal pain, menstrual problem, pelvic pain and dyspareunia.  Musculoskeletal: Positive for myalgias, back pain, joint swelling, arthralgias and  gait problem. Negative for neck pain and neck stiffness.       Patient has severe and advanced rheumatoid arthritis and it primarily  affects her right shoulder her left wrist in both knees  These arthritic lesions have interfered with her activities of daily living and have significantly decreased range of motion  Skin: Negative.  Negative for color change, pallor, rash and wound.  Allergic/Immunologic: Negative.  Negative for environmental allergies, food allergies and immunocompromised state.  Neurological: Negative.  Negative for dizziness, tremors, seizures, syncope, facial asymmetry, speech difficulty, weakness, light-headedness, numbness and headaches.  Hematological: Negative.  Negative for adenopathy. Does not bruise/bleed easily.  Psychiatric/Behavioral: Negative.  Negative for suicidal ideas, hallucinations, behavioral problems, confusion, sleep disturbance, self-injury, dysphoric mood, decreased concentration and agitation. The patient is not nervous/anxious and is not hyperactive.        Objective:   Physical Exam  Constitutional: She is oriented to person, place, and time. She appears well-developed and well-nourished. No distress.  Patient's weight was 303 pounds  HENT:  Head: Normocephalic and atraumatic.  Right Ear: External ear normal.  Left Ear: External ear normal.  Nose: Nose normal.  Mouth/Throat: Oropharynx is clear and moist. No oropharyngeal exudate.  Eyes: Conjunctivae and EOM are normal. Pupils are equal, round, and reactive to light. Right eye exhibits no discharge. Left eye exhibits no discharge. No scleral icterus.  Neck: Normal range of motion. Neck supple. No JVD present. No tracheal deviation present. No thyromegaly present.  Cardiovascular: Normal rate, regular rhythm, normal heart sounds and intact distal pulses.  Exam reveals no gallop and no friction rub.   No murmur heard. The patient was 130/73 mmHg  Temperature was 98.43F  Pulse was 76 bpm Respirations were 18 breaths per minute  SPO2 was 97%   Pulmonary/Chest: Effort normal and breath sounds normal. No respiratory distress. She  has no wheezes. She has no rales. She exhibits no tenderness.  Abdominal: Soft. Bowel sounds are normal. She exhibits distension. She exhibits no mass. There is tenderness. There is no rebound and no guarding.  She's had 3 umbilical hernia repairs and currently she has a large superior. Umbilical hernia  Genitourinary:  Genitourinary  examination was deferred  Musculoskeletal:  Range of motion was decreased especially in the hips and in the knee flexion and extension motion There was difficulty in abduction of the right shoulder Left wrist was swollen and tender and the skin was rather taut over the right left wrist There was a large pannus and the superior periumbilical hernia along the scars of repeated emboli: Hernia repair  Lymphadenopathy:    She has no cervical adenopathy.  Neurological: She is alert and oriented to person, place, and time. She has normal reflexes. She displays normal reflexes. No cranial nerve deficit. She exhibits normal muscle tone. Coordination normal.  Skin: Skin is warm and dry. No rash noted. She is not diaphoretic. No erythema. No pallor.  Psychiatric: She has a normal mood and affect. Her behavior is normal. Judgment and thought content normal.  Nursing note and vitals reviewed.         Assessment & Plan:  Assessment 1 severe rheumatoid arthritis 2 pain in the right shoulder 3 adhesive capsulitis of the right shoulder 4 pain in the left wrist 5 severe rheumatoid arthritis of the left wrist 6 Pain in both knees 7 Rheumatoid arthritis of both knees   Plan of management 1 for right suprascapular nerve block 80f or right radial and right median nerve blocks  3 continue current medication 4 possible intravenous lidocaine infusion in the future 5 spent some time discussing discussing the overall management of her pain problems with her including the need to lose some weight and to watch her diet and she appeared to accept and understand those  recommendations Plan to perform those procedures for her in the next 2 days   New patient    Level 4   Tod Persia M.D.

## 2015-03-19 NOTE — Progress Notes (Signed)
Safety precautions to be maintained throughout the outpatient stay will include: orient to surroundings, keep bed in low position, maintain call bell within reach at all times, provide assistance with transfer out of bed and ambulation.  

## 2015-03-21 ENCOUNTER — Ambulatory Visit: Payer: Medicaid Other | Attending: Anesthesiology | Admitting: Anesthesiology

## 2015-03-21 ENCOUNTER — Encounter: Payer: Self-pay | Admitting: Anesthesiology

## 2015-03-21 VITALS — BP 146/86 | HR 74 | Temp 98.8°F | Resp 16 | Ht 65.0 in | Wt 303.0 lb

## 2015-03-21 DIAGNOSIS — M25532 Pain in left wrist: Principal | ICD-10-CM

## 2015-03-21 DIAGNOSIS — G8929 Other chronic pain: Secondary | ICD-10-CM

## 2015-03-21 DIAGNOSIS — M05732 Rheumatoid arthritis with rheumatoid factor of left wrist without organ or systems involvement: Secondary | ICD-10-CM

## 2015-03-21 MED ORDER — BUPIVACAINE HCL (PF) 0.5 % IJ SOLN
INTRAMUSCULAR | Status: AC
  Filled 2015-03-21: qty 30

## 2015-03-21 NOTE — Patient Instructions (Signed)
To help with the soreness, apply ice to the injection site , 15 minutes on, 15 minutes off. Tomorrow, use heat. Do not eat or drink 2 hours before your next appt for your injection.

## 2015-03-21 NOTE — Progress Notes (Signed)
   Subjective:    Patient ID: [name] [name], female    DOB: 21-Dec-1960, 54 y.o.   MRN: [id number]  HPI    Review of Systems     Objective:   Physical Exam        Assessment & Plan:   Name of procedure Left radial nerve block and left median nerve block at the wrist  Date of procedure  03/21/2015   Surgeon Tod Persia M.D.   Anesthesia None  Informed consent was obtained and the patient appeared to accept and understand the benefits and risks of this procedure Timeout was done for verification of the site and side of the procedure The left wrist was prepped with Betadine Left median nerve block The tendon of the biceps muscle was palpated at the left wrist Medial to that tender in the left brachial artery was palpated While keeping her finger on the brachial artery up a 22-gauge needle was inserted immediately medial to the brachial artery and as the needle was inserted for about 2 cm a distinct paresthesia was elicited going down into the left forearm Shams were negative for blood and other body fluids Then 8 cc of 0.5% bupivacaine was injected in the region of the left median nerve at the wrists Intermittent aspirations were done during the injection and it were all negative The needle was removed Adequate hemostasis was obtained There were no complications  Left radial nerve The grooved area between the left brachioradialis and the left flexor carpi radialis was palpated Then a 3 inch 22-gauge needle was inserted in to that group After insertion of about 2 1/2 cm a distinct and profound paresthesia was elicited going down into the left forearm Aspirations were negative for blood and other body fluids Then 8 cc of 0.5% bupivacaine was injected into the region of the left radial nerve at the wrist Intermittent aspirations were all negative for blood and other body fluids The needle was removed Adequate hemostasis was obtained and there were no  complications Patient was observed in the care for approximately 15 minutes and then discharged home with her family We'll follow-up with her in 10 days and at that time will perform a right suprascapular nerve block   Tod Persia M.D.

## 2015-03-21 NOTE — Progress Notes (Signed)
Safety precautions to be maintained throughout the outpatient stay will include: orient to surroundings, keep bed in low position, maintain call bell within reach at all times, provide assistance with transfer out of bed and ambulation.  

## 2015-03-24 ENCOUNTER — Telehealth: Payer: Self-pay | Admitting: *Deleted

## 2015-03-24 NOTE — Telephone Encounter (Signed)
Left voicemail with patient to call our office if she has any questions or concerns re; the procedure on Friday.

## 2015-03-25 ENCOUNTER — Other Ambulatory Visit: Payer: Self-pay

## 2015-03-27 LAB — TOXASSURE SELECT 13 (MW), URINE: PDF: 0

## 2015-03-31 ENCOUNTER — Ambulatory Visit: Payer: Medicaid Other | Admitting: Anesthesiology

## 2015-04-01 ENCOUNTER — Ambulatory Visit: Payer: Medicaid Other | Admitting: Psychiatry

## 2015-04-01 ENCOUNTER — Telehealth: Payer: Self-pay | Admitting: Psychiatry

## 2015-04-03 NOTE — Progress Notes (Signed)
[facility] MD Progress Note  04/03/2015 1:21 PM [name] [name]  MRN:  [id number] Subjective:  Follow-up for this 54 year old woman with history of depression and anxiety. Mood has been improved without major depression but continues to complain of anxiety throughout the day. Occasional sensations that are like panic attacks. Denies suicidal ideation denies psychotic symptoms. Says she is compliant with medicine. Principal Problem: @PPROB @ Diagnosis:   Patient Active Problem List   Diagnosis Date Noted  . Wrist pain, chronic [M25.539, G89.29] 03/19/2015  . Adhesive capsulitis [M75.00] 03/19/2015  . Knee pain, bilateral [M25.561, M25.562] 03/19/2015  . Obesity [E66.9] 03/19/2015  . Female genuine stress incontinence [N39.3] 02/14/2015  . Urge incontinence of urine [N39.41] 02/14/2015  . Obstructive apnea [G47.33] 07/03/2014  . Congestive heart failure (HCC) [I50.9] 01/03/2014  . Anxiety [F41.9] 01/03/2014  . Diabetes mellitus (HCC) [E11.9] 01/03/2014  . Chronic obstructive pulmonary disease (HCC) [J44.9] 01/03/2014  . Bipolar affective disorder (HCC) [F31.9] 01/03/2014  . Diastolic dysfunction [I51.9] 01/03/2014  . Apnea, sleep [G47.30] 01/03/2014  . Adiposity [E66.9] 01/03/2014  . 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 Breathlessness on exertion [R06.09] 01/03/2014  . Incomplete bladder emptying [R33.9] 11/02/2012  . Chronic diastolic heart failure (HCC) [I50.32]   . Bladder retention [R33.9] 10/10/2012  . 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 Obstruction of urinary tract [N13.9] 10/04/2012  . FOM (frequency of micturition) [R35.0] 10/04/2012  . History of laparoscopic adjustable gastric banding, 03/20/2007.  Removed 09/19/2011. [Z98.84] 08/04/2011  . Nausea & vomiting [R11.2] 08/04/2011  . Morbid obesity (HCC) [E66.01] 08/04/2011  . Adult hypothyroidism [E03.9] 06/28/2010  . Rheumatoid  arthritis (HCC) [M06.9] 06/28/2010  . Essential (primary) hypertension [I10] 01/27/2005  . Moderate episode of recurrent major depressive disorder (HCC) [F33.1] 06/03/2004   Total Time spent with patient: 25 minutes  Past Psychiatric History: Patient has a history of anxiety and depression no suicide attempts identified  Past Medical History:  Past Medical History  Diagnosis Date  . Anxiety   . Morbid obesity (HCC)   . Depression   . Panic attacks   . GERD (gastroesophageal reflux disease)   . Fluid retention   . IBS (irritable bowel syndrome)   . Esophagitis   . Hiatal hernia   . Neurogenic bladder     has pacemaker  . Arthritis     Rheumatoid  . Neuropathy (HCC)   . Sleep apnea     STATES SEVERE, CANT TOLERATE MASK- LAST STUDY YEARS AGO  . Hypothyroidism   . Diabetes mellitus     states no meds or diet restrictions  at present  . Hypertension   . Obesity   . Rheumatoid arthritis (HCC)   . Diastolic CHF (HCC)   . Chronic diastolic heart failure [facility])     Past Surgical History  Procedure Laterality Date  . Tubal ligation    . Tonsillectomy    . Cholecystectomy    . Abdominal hysterectomy    . Laparoscopic gastric banding  03/20/07  . Eye surgery      bilateral cataract extraction with IOL  . Hernia repair      ventral hernia with strangulation   Family History:  Family History  Problem Relation Age of Onset  . Heart failure Father   . Bipolar disorder Father   . Alcohol abuse Father   . Anxiety disorder Father   . Depression Father   . Heart disease Brother   .  Heart attack Brother 45    MI s/p stents placed  . Anxiety disorder Sister   . Depression Sister   . Anxiety disorder Sister   . Depression Sister   . Bipolar disorder Sister   . Alcohol abuse Sister   . Drug abuse Sister   . Heart attack Brother    Family Psychiatric  History: Negative for mental health symptoms Social History:  History  Alcohol Use No     History  Drug Use No     Social History   Social History  . Marital Status: Divorced    Spouse Name: N/A  . Number of Children: N/A  . Years of Education: N/A   Social History Main Topics  . Smoking status: Former Smoker -- 2.00 packs/day for 27 years    Types: Cigarettes    Quit date: 07/30/1999  . Smokeless tobacco: Never Used  . Alcohol Use: No  . Drug Use: No  . Sexual Activity: Not Currently   Other Topics Concern  . None   Social History Narrative   Additional Social History:                         Sleep: Fair  Appetite:  Good  Current Medications: Current Outpatient Prescriptions  Medication Sig Dispense Refill  . Abatacept (ORENCIA [place]) Inject into the skin. Injection once weekly    . busPIRone (BUSPAR) 10 MG tablet Take 1 tablet (10 mg total) by mouth 2 (two) times daily. 60 tablet 1  . Cholecalciferol (VITAMIN D-1000 MAX ST) 1000 UNITS tablet Take by mouth.    . ciprofloxacin (CIPRO) 500 MG tablet Take 1 tablet (500 mg total) by mouth 2 (two) times daily. (Patient not taking: Reported on 03/19/2015) 20 tablet 0  . clindamycin (CLEOCIN) 300 MG capsule Take 1 capsule (300 mg total) by mouth 3 (three) times daily. (Patient not taking: Reported on 03/19/2015) 30 capsule 2  . clotrimazole-betamethasone (LOTRISONE) cream Apply 1 application topically 2 (two) times daily. For yeast infection under stomach    . FLUoxetine (PROZAC) 20 MG capsule TAKE FOUR CAPSULES BY MOUTH ONCE DAILY 120 capsule 0  . folic acid (FOLVITE) 1 MG tablet Take two tablets to make 2mg .    . gabapentin (NEURONTIN) 300 MG capsule Take 300 mg by mouth Three times a day.     . hydrOXYzine (ATARAX/VISTARIL) 50 MG tablet Take 50 mg by mouth Twice daily.     . Insulin Aspart (NOVOLOG [place]) Inject 10 Units into the skin 2 (two) times daily. Injection twice daily    . ketoconazole (NIZORAL) 2 % cream     . KLOR-CON M20 20 MEQ tablet TAKE ONE TABLET BY MOUTH TWICE DAILY (Patient taking differently: take 2 tabs in the  a.m. , 1 tab in the evening) 60 tablet 0  . KLOR-CON M20 20 MEQ tablet TAKE TWO TABLETS BY MOUTH IN THE MORNING AND ONE BY MOUTH IN THE EVENING 90 tablet 3  . levothyroxine (SYNTHROID, LEVOTHROID) 88 MCG tablet Take 88 mcg by mouth daily before breakfast.    . Liraglutide (VICTOZA) 18 MG/3ML SOPN Inject 0.6 mg into the skin.    lovastatin (MEVACOR) 40 MG tablet Take 40 mg by mouth.    . metFORMIN (GLUCOPHAGE) 500 MG tablet Take 1,000 mg by mouth daily.     . Methotrexate, PF, 25 MG/0.4ML SOAJ Inject into the skin once a week.    Marland Kitchen oxyCODONE-acetaminophen (PERCOCET) 10-325 MG per tablet  Take 1 tablet by mouth every 6 (six) hours as needed for pain. MAXIMUM TOTAL ACETAMINOPHEN DOSE IS 4000 MG PER DAY 20 tablet 0  . oxyCODONE-acetaminophen (PERCOCET) 5-325 MG per tablet Take 1 tablet by mouth every 6 (six) hours as needed. For pain    . predniSONE (DELTASONE) 10 MG tablet Take 5 mg by mouth daily as needed.     . promethazine (PHENERGAN) 25 MG tablet Take 25 mg by mouth every 6 (six) hours as needed. For nausea    . tamsulosin (FLOMAX) 0.4 MG CAPS Take 0.4 mg by mouth daily.    Marland Kitchen atorvastatin (LIPITOR) 80 MG tablet Take 80 mg by mouth daily.    Marland Kitchen buPROPion (WELLBUTRIN XL) 300 MG 24 hr tablet Take 1 tablet (300 mg total) by mouth daily. 30 tablet 1  . hydroxychloroquine (PLAQUENIL) 200 MG tablet Take 200 mg by mouth 2 (two) times daily.    Marland Kitchen leflunomide (ARAVA) 20 MG tablet Take 20 mg by mouth daily.    Marland Kitchen lisinopril (PRINIVIL,ZESTRIL) 2.5 MG tablet Take 2.5 mg by mouth daily.     No current facility-administered medications for this visit.    Lab Results: No results found for this or any previous visit (from the past 48 hour(s)).  Physical Findings: AIMS:  , ,  ,  ,    CIWA:    COWS:     Musculoskeletal: Strength & Muscle Tone: within normal limits Gait & Station: normal Patient leans: N/A  Psychiatric Specialty Exam: ROS  Blood pressure 132/92, pulse 101, temperature 97.8 F (36.6  C), temperature source Tympanic, height 5\' 6"  (1.676 m), weight 308 lb 3.2 oz (139.799 kg), last menstrual period 04/20/2001, SpO2 94 %.Body mass index is 49.77 kg/(m^2).  General Appearance: Casual  Eye Contact::  Good  Speech:  Slow  Volume:  Normal  Mood:  Anxious  Affect:  Full Range  Thought Process:  Goal Directed  Orientation:  Full (Time, Place, and Person)  Thought Content:  Negative  Suicidal Thoughts:  No  Homicidal Thoughts:  No  Memory:  Immediate;   Fair Recent;   Fair Remote;   Fair  Judgement:  Fair  Insight:  Fair  Psychomotor Activity:  Normal  Concentration:  Fair  Recall:  [employer] of Knowledge:Fair  Language: Fair  Akathisia:  No  Handed:  Right  AIMS (if indicated):     Assets:  Desire for Improvement Housing Social Support  ADL's:  Intact  Cognition: WNL  Sleep:      Treatment Plan Summary: Medication management and Plan Made change to medicine by adding BuSpar 10 mg twice a day. Continue Prozac at current dose. Supportive counseling and reviewed management of anxiety symptoms. Follow-up in a month.  John Clapacs 04/03/2015, 1:21 PM

## 2015-04-07 ENCOUNTER — Encounter: Payer: Self-pay | Admitting: Emergency Medicine

## 2015-04-07 ENCOUNTER — Emergency Department
Admission: EM | Admit: 2015-04-07 | Discharge: 2015-04-07 | Disposition: A | Discharge status: Home / Self Care | Payer: Medicaid Other | Attending: Emergency Medicine | Admitting: Emergency Medicine

## 2015-04-07 DIAGNOSIS — Z794 Long term (current) use of insulin: Secondary | ICD-10-CM | POA: Diagnosis not present

## 2015-04-07 DIAGNOSIS — I10 Essential (primary) hypertension: Secondary | ICD-10-CM | POA: Diagnosis not present

## 2015-04-07 DIAGNOSIS — Z79899 Other long term (current) drug therapy: Secondary | ICD-10-CM | POA: Insufficient documentation

## 2015-04-07 DIAGNOSIS — Z7984 Long term (current) use of oral hypoglycemic drugs: Secondary | ICD-10-CM | POA: Diagnosis not present

## 2015-04-07 DIAGNOSIS — Z87891 Personal history of nicotine dependence: Secondary | ICD-10-CM | POA: Diagnosis not present

## 2015-04-07 DIAGNOSIS — Z7952 Long term (current) use of systemic steroids: Secondary | ICD-10-CM | POA: Diagnosis not present

## 2015-04-07 DIAGNOSIS — E119 Type 2 diabetes mellitus without complications: Secondary | ICD-10-CM | POA: Diagnosis not present

## 2015-04-07 DIAGNOSIS — M0609 Rheumatoid arthritis without rheumatoid factor, multiple sites: Secondary | ICD-10-CM | POA: Insufficient documentation

## 2015-04-07 DIAGNOSIS — M069 Rheumatoid arthritis, unspecified: Secondary | ICD-10-CM

## 2015-04-07 DIAGNOSIS — M2550 Pain in unspecified joint: Secondary | ICD-10-CM | POA: Diagnosis present

## 2015-04-07 LAB — CBC
HCT: 42.8 % (ref 35.0–47.0)
Hemoglobin: 13.8 g/dL (ref 12.0–16.0)
MCH: 25.5 pg — ABNORMAL LOW (ref 26.0–34.0)
MCHC: 32.2 g/dL (ref 32.0–36.0)
MCV: 79.2 fL — ABNORMAL LOW (ref 80.0–100.0)
Platelets: 203 10*3/uL (ref 150–440)
RBC: 5.4 MIL/uL — ABNORMAL HIGH (ref 3.80–5.20)
RDW: 15.2 % — ABNORMAL HIGH (ref 11.5–14.5)
WBC: 7 10*3/uL (ref 3.6–11.0)

## 2015-04-07 LAB — SEDIMENTATION RATE: Sed Rate: 14 mm/hr (ref 0–30)

## 2015-04-07 MED ORDER — PREDNISONE 20 MG PO TABS: 60.0000 mg | ORAL_TABLET | Freq: Every day | ORAL | Status: DC

## 2015-04-07 MED ORDER — ONDANSETRON 8 MG PO TBDP
8.0000 mg | ORAL_TABLET | Freq: Once | ORAL | Status: AC
  Administered 2015-04-07: 8 mg via ORAL
  Filled 2015-04-07: qty 1

## 2015-04-07 MED ORDER — OXYCODONE-ACETAMINOPHEN 5-325 MG PO TABS
1.0000 | ORAL_TABLET | Freq: Once | ORAL | Status: AC
  Administered 2015-04-07: 1 via ORAL
  Filled 2015-04-07: qty 1

## 2015-04-07 MED ORDER — DEXAMETHASONE SODIUM PHOSPHATE 10 MG/ML IJ SOLN
20.0000 mg | Freq: Once | INTRAMUSCULAR | Status: AC
  Administered 2015-04-07: 20 mg via INTRAMUSCULAR
  Filled 2015-04-07: qty 2

## 2015-04-07 NOTE — ED Notes (Signed)
Assessed per PA 

## 2015-04-07 NOTE — Discharge Instructions (Signed)
Rheumatoid Arthritis  Rheumatoid arthritis is a long-term (chronic) inflammatory disease that causes pain, swelling, and stiffness of the joints. It can affect the entire body, including the eyes and lungs. The effects of rheumatoid arthritis vary widely among those with the condition.  CAUSES  The cause of rheumatoid arthritis is not known. It tends to run in families and is more common in women. Certain cells of the body's natural defense system (immune system) do not work properly and begin to attack healthy joints. It primarily involves the connective tissue that lines the joints (synovial membrane). This can cause damage to the joint.  SYMPTOMS  · Pain, stiffness, swelling, and decreased motion of many joints, especially in the hands and feet.  · Stiffness that is worse in the morning. It may last 1-2 hours or longer.  · Numbness and tingling in the hands.  · Fatigue.  · Loss of appetite.  · Weight loss.  · Low-grade fever.  · Dry eyes and mouth.  · Firm lumps (rheumatoid nodules) that grow beneath the skin in areas such as the elbows and hands.  DIAGNOSIS  Diagnosis is based on the symptoms described, an exam, and blood tests. Sometimes, X-rays are helpful.  TREATMENT  The goals of treatment are to relieve pain, reduce inflammation, and to slow down or stop joint damage and disability. Methods vary and may include:  · Maintaining a balance of rest, exercise, and proper nutrition.  · Your health care provider may adjust your medicines every 3 months until treatment goals are reached. Common medicines include:    Pain relievers (analgesics).    Corticosteroids and nonsteroidal anti-inflammatory drugs (NSAIDs) to reduce inflammation.    Disease-modifying antirheumatic drugs (DMARDs) to try to slow the course of the disease.    Biologic response modifiers to reduce inflammation and damage.  · Physical therapy and occupational therapy.  · Surgery for patients with severe joint damage. Joint replacement or fusing of  joints may be needed.  · Routine monitoring and ongoing care, such as office visits, blood and urine tests, and X-rays.  Your health care provider will work with you to identify the best treatment option for you, based on an assessment of the overall disease activity in your body.  HOME CARE INSTRUCTIONS  · Remain physically active and reduce activity when the disease gets worse.  · Eat a well-balanced diet.  · Put heat on affected joints when you wake up and before activities. Keep the heat on the affected joint for as long as directed by your health care provider.  · Put ice on affected joints following activities or exercising.    Put ice in a plastic bag.    Place a towel between your skin and the bag.    Leave the ice on for 15-20 minutes, 3-4 times per day, or as directed by your health care provider.  · Take medicines and supplements only as directed by your health care provider.  · Use splints as directed by your health care provider. Splints help maintain joint position and function.  · Do not sleep with pillows under your knees. This may lead to spasms.  · Participate in a self-management program to keep current with the latest treatment and coping skills.  SEEK IMMEDIATE MEDICAL CARE IF:  · You have fainting episodes.  · You have periods of extreme weakness.  · You rapidly develop a hot, painful joint that is more severe than usual joint aches.  · You have chills.  ·   You have a fever.  FOR MORE INFORMATION  · American College of Rheumatology: www.rheumatology.org  · Arthritis Foundation: www.arthritis.org     This information is not intended to replace advice given to you by your health care provider. Make sure you discuss any questions you have with your health care provider.     Document Released: 04/02/2000 Document Revised: 04/26/2014 Document Reviewed: 05/12/2011  Elsevier Interactive Patient Education ©2016 [employer]

## 2015-04-07 NOTE — ED Notes (Signed)
Pt  States having RA flare up worse the past three days.

## 2015-04-07 NOTE — ED Provider Notes (Signed)
[facility] Emergency Department Provider Note  ____________________________________________  Time seen: Approximately 4:55 PM  I have reviewed the triage vital signs and the nursing notes.   HISTORY  Chief Complaint Rheumatoid Arthritis    HPI [name] [name] is a 54 y.o. female who presents emergency department complaining of her rheumatoid arthritis. Patient presents via EMS for complaint of severe bilateral ankle, bilateral knee, bilateral shoulder, bilateral wrist pain. She states that she has a known diagnosis of rheumatoid arthritis and routinely takes methotrexate for same. Patient states in the past she has received steroids with good effect. Patient is on a pain management contract at this time. She has taken her Percocet prior to arrival with no relief of symptoms. Patient states that symptoms are severe, constant, unrelieved by her medications.   Past Medical History  Diagnosis Date  . Anxiety   . Morbid obesity (HCC)   . Depression   . Panic attacks   . GERD (gastroesophageal reflux disease)   . Fluid retention   . IBS (irritable bowel syndrome)   . Esophagitis   . Hiatal hernia   . Neurogenic bladder     has pacemaker  . Arthritis     Rheumatoid  . Neuropathy (HCC)   . Sleep apnea     STATES SEVERE, CANT TOLERATE MASK- LAST STUDY YEARS AGO  . Hypothyroidism   . Diabetes mellitus     states no meds or diet restrictions  at present  . Hypertension   . Obesity   . Rheumatoid arthritis (HCC)   . Diastolic CHF (HCC)   . Chronic diastolic heart failure [facility])     Patient Active Problem List   Diagnosis Date Noted  . Wrist pain, chronic 03/19/2015  . 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 Knee pain, bilateral 03/19/2015  . Obesity 03/19/2015  . Female genuine stress incontinence 02/14/2015  . Urge incontinence of urine 02/14/2015  . Obstructive apnea 07/03/2014  . Congestive heart failure (HCC) 01/03/2014  . Anxiety 01/03/2014  .  Diabetes mellitus (HCC) 01/03/2014  . Chronic obstructive pulmonary disease (HCC) 01/03/2014  . Bipolar affective disorder (HCC) 01/03/2014  . Diastolic dysfunction 01/03/2014  . Apnea, sleep 01/03/2014  . Adiposity 01/03/2014  . Combined fat and carbohydrate induced hyperlipemia 01/03/2014  . Peripheral nerve disease (HCC) 01/03/2014  . Breathlessness on exertion 01/03/2014  . Incomplete bladder emptying 11/02/2012  . Chronic diastolic heart failure (HCC)   . Bladder retention 10/10/2012  . Detrusor muscle hypertonia 10/04/2012  . Obstruction of urinary tract 10/04/2012  . FOM (frequency of micturition) 10/04/2012  . History of laparoscopic adjustable gastric banding, 03/20/2007.  Removed 09/19/2011. 08/04/2011  . Nausea & vomiting 08/04/2011  . Morbid obesity (HCC) 08/04/2011  . Adult hypothyroidism 06/28/2010  . Rheumatoid arthritis (HCC) 06/28/2010  . Essential (primary) hypertension 01/27/2005  . Moderate episode of recurrent major depressive disorder (HCC) 06/03/2004    Past Surgical History  Procedure Laterality Date  . Tubal ligation    . Tonsillectomy    . Cholecystectomy    . Abdominal hysterectomy    . Laparoscopic gastric banding  03/20/07  . Eye surgery      bilateral cataract extraction with IOL  . Hernia repair      ventral hernia with strangulation    Current Outpatient Rx  Name  Route  Sig  Dispense  Refill  . Abatacept (ORENCIA [place])   Subcutaneous   Inject into the skin. Injection once weekly         .  atorvastatin (LIPITOR) 80 MG tablet   Oral   Take 80 mg by mouth daily.         Marland Kitchen buPROPion (WELLBUTRIN XL) 300 MG 24 hr tablet   Oral   Take 1 tablet (300 mg total) by mouth daily.   30 tablet   1   . busPIRone (BUSPAR) 10 MG tablet   Oral   Take 1 tablet (10 mg total) by mouth 2 (two) times daily.   60 tablet   1   . Cholecalciferol (VITAMIN D-1000 MAX ST) 1000 UNITS tablet   Oral   Take by mouth.         . ciprofloxacin (CIPRO) 500 MG  tablet   Oral   Take 1 tablet (500 mg total) by mouth 2 (two) times daily. Patient not taking: Reported on 03/19/2015   20 tablet   0   . clindamycin (CLEOCIN) 300 MG capsule   Oral   Take 1 capsule (300 mg total) by mouth 3 (three) times daily. Patient not taking: Reported on 03/19/2015   30 capsule   2   . clotrimazole-betamethasone (LOTRISONE) cream   Topical   Apply 1 application topically 2 (two) times daily. For yeast infection under stomach         . FLUoxetine (PROZAC) 20 MG capsule      TAKE FOUR CAPSULES BY MOUTH ONCE DAILY   120 capsule   0   . folic acid (FOLVITE) 1 MG tablet      Take two tablets to make 2mg .         . gabapentin (NEURONTIN) 300 MG capsule   Oral   Take 300 mg by mouth Three times a day.          . hydroxychloroquine (PLAQUENIL) 200 MG tablet   Oral   Take 200 mg by mouth 2 (two) times daily.         . hydrOXYzine (ATARAX/VISTARIL) 50 MG tablet   Oral   Take 50 mg by mouth Twice daily.          . Insulin Aspart (NOVOLOG [place])   Subcutaneous   Inject 10 Units into the skin 2 (two) times daily. Injection twice daily         . ketoconazole (NIZORAL) 2 % cream               . KLOR-CON M20 20 MEQ tablet      TAKE ONE TABLET BY MOUTH TWICE DAILY Patient taking differently: take 2 tabs in the a.m. , 1 tab in the evening   60 tablet   0   . KLOR-CON M20 20 MEQ tablet      TAKE TWO TABLETS BY MOUTH IN THE MORNING AND ONE BY MOUTH IN THE EVENING   90 tablet   3   . leflunomide (ARAVA) 20 MG tablet   Oral   Take 20 mg by mouth daily.         Marland Kitchen levothyroxine (SYNTHROID, LEVOTHROID) 88 MCG tablet   Oral   Take 88 mcg by mouth daily before breakfast.         . Liraglutide (VICTOZA) 18 MG/3ML SOPN   Subcutaneous   Inject 0.6 mg into the skin.         Marland Kitchen lisinopril (PRINIVIL,ZESTRIL) 2.5 MG tablet   Oral   Take 2.5 mg by mouth daily.         Marland Kitchen lovastatin (MEVACOR) 40 MG tablet   Oral   Take  40 mg by  mouth.         . metFORMIN (GLUCOPHAGE) 500 MG tablet   Oral   Take 1,000 mg by mouth daily.          . Methotrexate, PF, 25 MG/0.4ML SOAJ   Subcutaneous   Inject into the skin once a week.         Marland Kitchen oxyCODONE-acetaminophen (PERCOCET) 10-325 MG per tablet   Oral   Take 1 tablet by mouth every 6 (six) hours as needed for pain. MAXIMUM TOTAL ACETAMINOPHEN DOSE IS 4000 MG PER DAY   20 tablet   0   . oxyCODONE-acetaminophen (PERCOCET) 5-325 MG per tablet   Oral   Take 1 tablet by mouth every 6 (six) hours as needed. For pain         . predniSONE (DELTASONE) 10 MG tablet   Oral   Take 5 mg by mouth daily as needed.          . predniSONE (DELTASONE) 20 MG tablet   Oral   Take 3 tablets (60 mg total) by mouth daily.   30 tablet   0     Take 60 mg daily x one week, take 40 mg daily 3 d ...   . promethazine (PHENERGAN) 25 MG tablet   Oral   Take 25 mg by mouth every 6 (six) hours as needed. For nausea         . tamsulosin (FLOMAX) 0.4 MG CAPS   Oral   Take 0.4 mg by mouth daily.           Allergies Codeine; Propoxyphene; Sulfa antibiotics; Hydrocodone; and Sulfasalazine  Family History  Problem Relation Age of Onset  . Heart failure Father   . Bipolar disorder Father   . Alcohol abuse Father   . Anxiety disorder Father   . Depression Father   . Heart disease Brother   . Heart attack Brother 65    MI s/p stents placed  . Anxiety disorder Sister   . Depression Sister   . Anxiety disorder Sister   . Depression Sister   . Bipolar disorder Sister   . Alcohol abuse Sister   . Drug abuse Sister   . Heart attack Brother     Social History Social History  Substance Use Topics  . Smoking status: Former Smoker -- 2.00 packs/day for 27 years    Types: Cigarettes    Quit date: 07/30/1999  . Smokeless tobacco: Never Used  . Alcohol Use: No    Review of Systems Constitutional: No fever/chills Eyes: No visual changes. ENT: No sore  throat. Cardiovascular: Denies chest pain. Respiratory: Denies shortness of breath. Gastrointestinal: No abdominal pain.  No nausea, no vomiting.  No diarrhea.  No constipation. Genitourinary: Negative for dysuria. Musculoskeletal: Negative for back pain. Endorses bilateral ankle, bilateral knee, bilateral shoulder, bilateral wrist pain. Skin: Negative for rash. Neurological: Negative for headaches, focal weakness or numbness.  10-point ROS otherwise negative.  ____________________________________________   PHYSICAL EXAM:  VITAL SIGNS: ED Triage Vitals  Enc Vitals Group     BP 04/07/15 1616 111/89 mmHg     Pulse Rate 04/07/15 1616 91     Resp 04/07/15 1616 20     Temp 04/07/15 1616 98.4 F (36.9 C)     Temp Source 04/07/15 1616 Oral     SpO2 04/07/15 1616 96 %     Weight 04/07/15 1616 305 lb (138.347 kg)     Height 04/07/15 1616 5\' 5"  (  1.651 m)     Head Cir --      Peak Flow --      Pain Score 04/07/15 1619 10     Pain Loc --      Pain Edu? --      Excl. in GC? --     Constitutional: Alert and oriented. Well appearing and in no acute distress. Eyes: Conjunctivae are normal. PERRL. EOMI. Head: Atraumatic. Nose: No congestion/rhinnorhea. Mouth/Throat: Mucous membranes are moist.  Oropharynx non-erythematous. Neck: No stridor.   Cardiovascular: Normal rate, regular rhythm. Grossly normal heart sounds.  Good peripheral circulation. Respiratory: Normal respiratory effort.  No retractions. Lungs CTAB. Gastrointestinal: Soft and nontender. No distention. No abdominal bruits. No CVA tenderness. Musculoskeletal: No visible deformities to the affected joints of bilateral wrists, bilateral shoulders, bilateral knees, bilateral ankles. Patient is diffusely tender to palpation over joints. No palpable abnormality at any joint. The joint effusions. No warmth or increased edema to the areas.  No joint effusions. Neurologic:  Normal speech and language. No gross focal neurologic  deficits are appreciated. No gait instability. Skin:  Skin is warm, dry and intact. No rash noted. Psychiatric: Mood and affect are normal. Speech and behavior are normal.  ____________________________________________   LABS (all labs ordered are listed, but only abnormal results are displayed)  Labs Reviewed  CBC - Abnormal; Notable for the following:    RBC 5.40 (*)    MCV 79.2 (*)    MCH 25.5 (*)    RDW 15.2 (*)    All other components within normal limits  SEDIMENTATION RATE   ____________________________________________  EKG   ____________________________________________  RADIOLOGY   ____________________________________________   PROCEDURES  Procedure(s) performed: None  Critical Care performed: No  ____________________________________________   INITIAL IMPRESSION / ASSESSMENT AND PLAN / ED COURSE  Pertinent labs & imaging results that were available during my care of the patient were reviewed by me and considered in my medical decision making (see chart for details).  Patient reports emergency department with a complaint of severe rheumatoid arthritis pain. This involves multiple joints to include bilateral wrist, bilateral shoulders, bilateral knees, bilateral ankles. Patient takes methotrexate at home and is on current pain management contract. Patient is given Percocet in the emergency department as well as an injection of steroids. She'll be discharged home with prednisone prescription for symptom control. She is to follow-up with her primary care for further control if required. ____________________________________________   FINAL CLINICAL IMPRESSION(S) / ED DIAGNOSES  Final diagnoses:  Rheumatoid arthritis flare (HCC)  Rheumatoid arthritis involving multiple sites, unspecified rheumatoid factor presence [facility])      Racheal Patches, PA-C 04/07/15 1708  Myrna Blazer, MD 04/08/15 [phone]

## 2015-04-15 ENCOUNTER — Ambulatory Visit: Payer: Medicaid Other | Admitting: Psychiatry

## 2015-04-16 ENCOUNTER — Encounter: Payer: Medicaid Other | Admitting: Pain Medicine

## 2015-07-10 ENCOUNTER — Other Ambulatory Visit: Payer: Self-pay | Admitting: Psychiatry

## 2015-07-22 ENCOUNTER — Encounter: Payer: Self-pay | Admitting: Anesthesiology

## 2015-07-22 ENCOUNTER — Other Ambulatory Visit: Payer: Self-pay | Admitting: Preventative Medicine

## 2015-07-22 ENCOUNTER — Ambulatory Visit: Payer: Medicaid Other | Attending: Anesthesiology | Admitting: Anesthesiology

## 2015-07-22 VITALS — BP 125/79 | HR 82 | Temp 98.2°F | Resp 16 | Ht 65.0 in | Wt 267.0 lb

## 2015-07-22 DIAGNOSIS — F319 Bipolar disorder, unspecified: Secondary | ICD-10-CM | POA: Insufficient documentation

## 2015-07-22 DIAGNOSIS — M069 Rheumatoid arthritis, unspecified: Secondary | ICD-10-CM | POA: Insufficient documentation

## 2015-07-22 DIAGNOSIS — Z1231 Encounter for screening mammogram for malignant neoplasm of breast: Secondary | ICD-10-CM

## 2015-07-22 DIAGNOSIS — F411 Generalized anxiety disorder: Secondary | ICD-10-CM | POA: Insufficient documentation

## 2015-07-22 DIAGNOSIS — M25532 Pain in left wrist: Secondary | ICD-10-CM | POA: Insufficient documentation

## 2015-07-22 DIAGNOSIS — M05732 Rheumatoid arthritis with rheumatoid factor of left wrist without organ or systems involvement: Secondary | ICD-10-CM

## 2015-07-22 DIAGNOSIS — G8929 Other chronic pain: Secondary | ICD-10-CM | POA: Diagnosis not present

## 2015-07-22 DIAGNOSIS — M05731 Rheumatoid arthritis with rheumatoid factor of right wrist without organ or systems involvement: Secondary | ICD-10-CM

## 2015-07-22 DIAGNOSIS — E669 Obesity, unspecified: Secondary | ICD-10-CM | POA: Diagnosis not present

## 2015-07-22 DIAGNOSIS — G4730 Sleep apnea, unspecified: Secondary | ICD-10-CM | POA: Insufficient documentation

## 2015-07-22 DIAGNOSIS — Z7952 Long term (current) use of systemic steroids: Secondary | ICD-10-CM

## 2015-07-22 MED ORDER — MELOXICAM 7.5 MG PO TABS: 7.5000 mg | ORAL_TABLET | Freq: Two times a day (BID) | ORAL | Status: DC

## 2015-07-22 NOTE — Patient Instructions (Signed)
You were given a prescription for Meloxicam. It was e-scribed to your pharmacy.

## 2015-07-22 NOTE — Progress Notes (Signed)
Safety precautions to be maintained throughout the outpatient stay will include: orient to surroundings, keep bed in low position, maintain call bell within reach at all times, provide assistance with transfer out of bed and ambulation.  

## 2015-07-22 NOTE — Progress Notes (Signed)
   Subjective:    Patient ID: [name] [name], female    DOB: [birth year], 55 y.o.   MRN: [id number]  HPI  This patient returned to the clinic today indicating that she got absolutely n from the left radial and left me blocks performed for her last month She indicated that her subjective pain  Intensity rating was 95% She showed positive signs of symptom exaggeration and symptom dramatization She had past history of bipolar affective disorder and anxiety neurosis She also has a history of sleep apnea associated with obesity She pointed out very directly to me that she has been on opioids for the past 5 year I pointed out to her as directly that opioids for 5 years from me She appeared to accept my recommendation and I gave her reasons based on her past history which would put her at risk to respiratory depression and possibly opioid cardiorespiratory failure She accepted my explanation   Review of Systems  Constitutional:       She is obese and has sleep apnea  HENT: Negative.   Eyes: Negative.   Respiratory: Negative.        This patient has a history of sleep apneoa  Cardiovascular: Negative.   Endocrine: Negative.   Genitourinary: Negative.   Allergic/Immunologic: Negative.   Neurological: Negative.   Hematological: Negative.   Psychiatric/Behavioral: Positive for behavioral problems, sleep disturbance and dysphoric mood. Negative for suicidal ideas, hallucinations, confusion, self-injury, decreased concentration and agitation. The patient is nervous/anxious. The patient is not hyperactive.        She has a past history of bipolar affective disorder and anxiety neurosis       Objective:   Physical Exam  Cardiovascular:  This patient was relatively arm and appeared to be in no distress Her blood pressure was 125/79 mmHg Pulse was 82 bpm Heart sounds 1 and 2 were heard in all area There were no audible murmurs Temperature was 98.67F Respirations were 16 breaths per  minute S PO2 was 95% Chest was clinically clear There were no adventitious sounds Abdomen was soft and nontender There was no palpable organomegaly There was no significant lymphadenopathy There was mild to moderate tenderness over the left wrist There were no new neurological nor musculoskeletal findings   Nursing note and vitals reviewed.         Assessment & Plan:   Assessment 1 chronic left wrist pain 2 rheumatoid arthritis 3 status post bipolar affective disorder 4 status post sleep apnea    Plan of management Patient requested Percocet indicated and this was the medicine that she has been taking for the past t 5 years Her request was denied with pharmacological and medical reasons She was offered and she accepted meloxicam 7.5 mg twice a day; she was given 30 tablets with one refill We'll follow-up with her in 1 month    Established patient     Level of 3   Tod Persia M.D.

## 2015-07-23 ENCOUNTER — Telehealth: Payer: Self-pay | Admitting: Anesthesiology

## 2015-07-23 NOTE — Telephone Encounter (Signed)
Patient states pharmacy told her she was allergic to the moloxicam can different meds script be written ? Please call patient

## 2015-07-24 NOTE — Telephone Encounter (Signed)
Spoke with pharmacist, the concern was a potential drug interaction with Methotrexate. Pharmacist states possible complication is unlikely because patient does not currently have cancer and the dose of Methotrexate is low. Dr. Starling Manns notified of this, advised to go ahead and fill the prescription for Meloxicam.

## 2015-08-22 ENCOUNTER — Telehealth: Payer: Self-pay | Admitting: Anesthesiology

## 2015-08-22 NOTE — Telephone Encounter (Signed)
Patient informed that she must come for appt to make any medication changes. Currently does not have an appointment. Transferred the call to secretary to schedule an appt.

## 2015-08-22 NOTE — Telephone Encounter (Signed)
Patient meds are not helping, would like to try something different, please call

## 2015-08-29 ENCOUNTER — Ambulatory Visit: Payer: Medicaid Other | Attending: Anesthesiology | Admitting: Anesthesiology

## 2015-08-29 ENCOUNTER — Encounter: Payer: Self-pay | Admitting: Anesthesiology

## 2015-08-29 VITALS — BP 156/82 | HR 74 | Temp 98.1°F | Resp 16 | Ht 65.0 in | Wt 270.0 lb

## 2015-08-29 DIAGNOSIS — M05732 Rheumatoid arthritis with rheumatoid factor of left wrist without organ or systems involvement: Secondary | ICD-10-CM

## 2015-08-29 DIAGNOSIS — M25532 Pain in left wrist: Secondary | ICD-10-CM | POA: Insufficient documentation

## 2015-08-29 DIAGNOSIS — G8929 Other chronic pain: Secondary | ICD-10-CM | POA: Diagnosis not present

## 2015-08-29 DIAGNOSIS — M2550 Pain in unspecified joint: Secondary | ICD-10-CM | POA: Insufficient documentation

## 2015-08-29 DIAGNOSIS — G629 Polyneuropathy, unspecified: Secondary | ICD-10-CM | POA: Diagnosis not present

## 2015-08-29 DIAGNOSIS — M25562 Pain in left knee: Secondary | ICD-10-CM

## 2015-08-29 DIAGNOSIS — M069 Rheumatoid arthritis, unspecified: Secondary | ICD-10-CM | POA: Insufficient documentation

## 2015-08-29 DIAGNOSIS — M25561 Pain in right knee: Secondary | ICD-10-CM

## 2015-08-29 MED ORDER — OXYCODONE-ACETAMINOPHEN 5-325 MG PO TABS: 1.0000 | ORAL_TABLET | Freq: Three times a day (TID) | ORAL | Status: DC | PRN

## 2015-08-29 NOTE — Progress Notes (Signed)
Safety precautions to be maintained throughout the outpatient stay will include: orient to surroundings, keep bed in low position, maintain call bell within reach at all times, provide assistance with transfer out of bed and ambulation.  

## 2015-08-29 NOTE — Patient Instructions (Signed)
You must bring a driver for your Lidocaine infusion. You were given a prescription for Percocet today.

## 2015-08-29 NOTE — Progress Notes (Signed)
   Subjective:    Patient ID: [name] [name], female    DOB: March 29, 1961, 55 y.o.   MRN: [id number]  HPI    Review of Systems     Objective:   Physical Exam        Assessment & Plan:

## 2015-08-30 NOTE — Progress Notes (Signed)
   Subjective:    Patient ID: JANEKA LIBMAN, female    DOB: 06-14-1960, 55 y.o.   MRN: [id number]  HPI  This patient came into the clinic tod and look to be in some distress Her subjective pain intensity rating today was 90% She indicates that her left wrist and all other joints were very painful She looked slightly pale and there was some discoloration around her ankles suggestive of hypo-circulatory state She clearly has been dependent on opioid medication and I pointed out to her that given her general condition, too many opioids may not be a good thing for her  She emphasizes at this point that her left wrist was excruciatingly painful  Review of Systems  HENT: Negative.   Eyes: Negative.   Respiratory: Negative.   Cardiovascular: Negative.   Gastrointestinal: Negative.   Endocrine: Negative.   Genitourinary: Negative.   Musculoskeletal: Positive for myalgias, back pain, joint swelling, arthralgias and gait problem. Negative for neck pain and neck stiffness.  Skin: Negative.   Allergic/Immunologic: Negative.   Neurological: Negative.   Hematological: Negative.   Psychiatric/Behavioral: Negative.        Objective:   Physical Exam  Cardiovascular:  This patient appeared to be in some distress Her left wrist was extremely painful and it was swollen and very tender to touch Her blood pressure was 156/82 meters of mercury Her pulse was 74 bpm Equal and regular Heart sounds 1 and 2 were heard in all areas There were no audible murmurs  Temperature was 98.61F  Respirations were 16 breaths per mi SPO2 was 97%  Chest was clinically clear  There were no adventitious sounds  Abdomen was soft and nontender  No palpable organomegaly  There was no significant lymphadenopa Pupils were equal and reactive  Cranial nerves were intact  There was tenderness over oth of her knees and her shoulders and her ankles but the most exquisite pain was in her left wrist    Nursing note and  vitals reviewed.         Assessment & Plan:    Assessment 1 chronic left wrist pain 2 multiple polyarthralgia 3 status post rheumatoid arthritis 4 peripheral neuropathy 5 myofascial pain syndrome    Plan of management 1 we'll plan to give the patient oxycodone with acetaminophen 5/325 mg 1 tablet 3 tim when necessary and will give her 42 pills  2 we'll bring her back in 2 weeks for an intravenous lidocaine infusion and possibly some interventions for her left wrist pain    Established patient      Level III    Tod Persia M.D.

## 2015-09-05 ENCOUNTER — Ambulatory Visit: Payer: Medicaid Other | Admitting: Anesthesiology

## 2015-09-12 ENCOUNTER — Ambulatory Visit: Payer: Medicaid Other | Attending: Anesthesiology | Admitting: Anesthesiology

## 2015-09-12 ENCOUNTER — Encounter: Payer: Self-pay | Admitting: Anesthesiology

## 2015-09-12 VITALS — BP 131/73 | HR 82 | Temp 98.1°F | Resp 16 | Ht 65.0 in | Wt 267.0 lb

## 2015-09-12 DIAGNOSIS — M25562 Pain in left knee: Secondary | ICD-10-CM | POA: Insufficient documentation

## 2015-09-12 DIAGNOSIS — M06811 Other specified rheumatoid arthritis, right shoulder: Secondary | ICD-10-CM | POA: Diagnosis not present

## 2015-09-12 DIAGNOSIS — M05711 Rheumatoid arthritis with rheumatoid factor of right shoulder without organ or systems involvement: Secondary | ICD-10-CM

## 2015-09-12 DIAGNOSIS — M25561 Pain in right knee: Secondary | ICD-10-CM | POA: Insufficient documentation

## 2015-09-12 DIAGNOSIS — G629 Polyneuropathy, unspecified: Secondary | ICD-10-CM

## 2015-09-12 MED ORDER — DIAZEPAM 5 MG PO TABS
ORAL_TABLET | ORAL | Status: AC
  Administered 2015-09-12: 10 mg via ORAL
  Filled 2015-09-12: qty 2

## 2015-09-12 NOTE — Patient Instructions (Signed)

## 2015-09-12 NOTE — Progress Notes (Signed)
Lidocaine infusion of Lidocaine 4mg /ml infusing at 121 ml/hr per Dr .  Weight 268lbs.  Dosage calculated and double checked with Starling Manns RN and L. Bernardo Heater RN

## 2015-09-12 NOTE — Progress Notes (Signed)
Safety precautions to be maintained throughout the outpatient stay will include: orient to surroundings, keep bed in low position, maintain call bell within reach at all times, provide assistance with transfer out of bed and ambulation.  

## 2015-09-12 NOTE — Progress Notes (Signed)
   Subjective:    Patient ID: [name] [name], female    DOB: Jun 26, 1960, 55 y.o.   MRN: [id number]  HPI    Review of Systems     Objective:Her weight was 268 pounds or 121 kg    Physical Exam  Procedure note IV lidocaine infusion  Date of procedure 5 of May 2017  Surgeon Tod Persia M.D.  Informed consent was obtained and the patient appeared to accept and understand the benefits and risks of this procedure  Patient was taken to the procedure room and she was weighed Her weight was 268 pounds or 121 kg The dose to be administered was 4 mg/kg and this turned out to be 484 mg of lidocaine which was to be infused in 250 cc of 5% dextrose water Intravenous access was established The patient was attached to the customary monitors including automatic blood pressure pulse oximetry electrocardiogram respirations and heart rate. The patient was advised to be under lookout for tinnitus circum-oral numbness and metallic taste in the mouth and tongue; these were prodromal signs of an impending seizure and if they were to occur, the infusion would be stopped immediately The patient was given diazepam 10 mg orally immediately before starting the infusion The infusion of 484 mg of lidocaine in 5% dextrose water was started and was administered over a period of one hour The patient received 121 mils during that our Patient tolerated the procedure quite well there were no adverse effects and there were no seizure activity Patient was observed for an additional 30 minutes and there were no side effects of any kind Sincerely the patient was totally pain free when she left the clinic Intravenous equipment was removed and adequate hemostasis at the site was obtained and the patient left the clinic in satisfactory condition without any complications Follow-up with her in 1 month   Tod Persia M.D.      Assessment & Plan:

## 2015-09-16 ENCOUNTER — Telehealth: Payer: Self-pay | Admitting: *Deleted

## 2015-09-16 NOTE — Telephone Encounter (Signed)
Spoke with patient verbalizes no questions or concerns from procedure on Friday.

## 2015-10-17 ENCOUNTER — Ambulatory Visit: Payer: Medicaid Other | Attending: Anesthesiology | Admitting: Anesthesiology

## 2015-10-17 ENCOUNTER — Encounter: Payer: Self-pay | Admitting: Anesthesiology

## 2015-10-17 VITALS — BP 117/73 | HR 65 | Temp 98.3°F | Resp 15 | Ht 66.0 in | Wt 265.0 lb

## 2015-10-17 DIAGNOSIS — M7500 Adhesive capsulitis of unspecified shoulder: Secondary | ICD-10-CM | POA: Insufficient documentation

## 2015-10-17 DIAGNOSIS — G8929 Other chronic pain: Secondary | ICD-10-CM

## 2015-10-17 DIAGNOSIS — M25539 Pain in unspecified wrist: Secondary | ICD-10-CM | POA: Diagnosis not present

## 2015-10-17 DIAGNOSIS — M25561 Pain in right knee: Secondary | ICD-10-CM | POA: Diagnosis not present

## 2015-10-17 DIAGNOSIS — M25562 Pain in left knee: Secondary | ICD-10-CM | POA: Insufficient documentation

## 2015-10-17 DIAGNOSIS — M05711 Rheumatoid arthritis with rheumatoid factor of right shoulder without organ or systems involvement: Secondary | ICD-10-CM | POA: Insufficient documentation

## 2015-10-17 MED ORDER — LIDOCAINE IN D5W 4-5 MG/ML-% IV SOLN
INTRAVENOUS | Status: AC
  Administered 2015-10-17: 120 mg/min via INTRAVENOUS
  Filled 2015-10-17: qty 500

## 2015-10-17 MED ORDER — DIAZEPAM 5 MG PO TABS
ORAL_TABLET | ORAL | Status: AC
  Administered 2015-10-17: 10 mg via ORAL
  Filled 2015-10-17: qty 2

## 2015-10-17 NOTE — Patient Instructions (Signed)

## 2015-10-17 NOTE — Progress Notes (Signed)
Safety precautions to be maintained throughout the outpatient stay will include: orient to surroundings, keep bed in low position, maintain call bell within reach at all times, provide assistance with transfer out of bed and ambulation.  

## 2015-10-18 NOTE — Progress Notes (Signed)
   Subjective:    Patient ID: [name] [name], female    DOB: [birth year], 55 y.o.   MRN: [id number]  HPI    Review of Systems     Objective:   Physical Exam        Assessment & Plan:   Date of procedure October 17, 2015  Procedure Intravenous lidocaine infusion  Surgeon Tod Persia M.D.  Informed consent was obtained and the patient appeared to accept and understand the benefits and risks of this  Procedure  The patient was taken to the procedure room where she was weighed. Her weight was 268 p or 121 kg The dose of lidocaine to be administered was 4 mg/kg The patient was placed in the supine position Intravenous access was established The customary monitors included been automatic blood pressure pulse oximetry electrocardiography respiratory rate  And pulse measurements were all applied The patient was given 10 mg of Valium orally 10 minutes before starting the infusion The calculated dose of lidocaine to be administered was 484 mg and this was to be administered in 250 mL all 5%  Dextrose water. The patient was cautioned to be on the look out for tinnitus circum-oral numbness and metallic  taste in the mouth. These symptoms were prodromal symptoms or a possible impending seizure The infusion was commenced and was carried out in the period of one hour During that time there were no prodromal symptoms or seizures and there were no seizures Patient tolerated the procedure quite well Vital signs were stable The patient was observed for an additional 30 minutes and during that time there were no adverse  Effects complications or seizures Be intravenous access was removed and patient was  Discharged home with her family Follow up with her in 1 month   Tod Persia M.D.

## 2015-10-21 DIAGNOSIS — R9431 Abnormal electrocardiogram [ECG] [EKG]: Secondary | ICD-10-CM | POA: Insufficient documentation

## 2015-10-21 DIAGNOSIS — E1169 Type 2 diabetes mellitus with other specified complication: Secondary | ICD-10-CM | POA: Insufficient documentation

## 2015-10-21 DIAGNOSIS — M869 Osteomyelitis, unspecified: Secondary | ICD-10-CM

## 2015-10-21 DIAGNOSIS — E876 Hypokalemia: Secondary | ICD-10-CM

## 2015-10-21 HISTORY — DX: Hypokalemia: E87.6

## 2015-10-22 ENCOUNTER — Telehealth: Payer: Self-pay | Admitting: *Deleted

## 2015-10-22 DIAGNOSIS — R197 Diarrhea, unspecified: Secondary | ICD-10-CM | POA: Insufficient documentation

## 2015-10-22 NOTE — Telephone Encounter (Signed)
Attempted to reach patient,  Mailbox is full and am unable to leave a message.  No other phone numbers listed.

## 2015-10-27 ENCOUNTER — Encounter: Payer: Self-pay | Admitting: Podiatry

## 2015-11-14 ENCOUNTER — Ambulatory Visit: Payer: Medicaid Other | Admitting: Anesthesiology

## 2015-11-25 DIAGNOSIS — G629 Polyneuropathy, unspecified: Secondary | ICD-10-CM | POA: Insufficient documentation

## 2015-11-25 DIAGNOSIS — K219 Gastro-esophageal reflux disease without esophagitis: Secondary | ICD-10-CM | POA: Insufficient documentation

## 2015-12-20 ENCOUNTER — Other Ambulatory Visit: Payer: Self-pay | Admitting: Psychiatry

## 2015-12-23 ENCOUNTER — Encounter: Payer: Self-pay | Admitting: Pain Medicine

## 2015-12-23 DIAGNOSIS — F1190 Opioid use, unspecified, uncomplicated: Secondary | ICD-10-CM | POA: Insufficient documentation

## 2015-12-23 DIAGNOSIS — M792 Neuralgia and neuritis, unspecified: Secondary | ICD-10-CM | POA: Insufficient documentation

## 2015-12-23 DIAGNOSIS — G8929 Other chronic pain: Secondary | ICD-10-CM | POA: Insufficient documentation

## 2015-12-23 DIAGNOSIS — Z5181 Encounter for therapeutic drug level monitoring: Secondary | ICD-10-CM | POA: Insufficient documentation

## 2015-12-23 DIAGNOSIS — M25562 Pain in left knee: Secondary | ICD-10-CM

## 2015-12-23 DIAGNOSIS — Z0189 Encounter for other specified special examinations: Secondary | ICD-10-CM | POA: Insufficient documentation

## 2015-12-23 DIAGNOSIS — M25561 Pain in right knee: Secondary | ICD-10-CM

## 2015-12-23 DIAGNOSIS — Z969 Presence of functional implant, unspecified: Secondary | ICD-10-CM | POA: Insufficient documentation

## 2015-12-23 DIAGNOSIS — Z79891 Long term (current) use of opiate analgesic: Secondary | ICD-10-CM | POA: Insufficient documentation

## 2015-12-23 DIAGNOSIS — E1142 Type 2 diabetes mellitus with diabetic polyneuropathy: Secondary | ICD-10-CM | POA: Insufficient documentation

## 2015-12-24 ENCOUNTER — Ambulatory Visit
Admission: RE | Admit: 2015-12-24 | Discharge: 2015-12-24 | Disposition: A | Discharge status: Home / Self Care | Payer: Medicaid Other | Source: Ambulatory Visit | Attending: Pain Medicine | Admitting: Pain Medicine

## 2015-12-24 ENCOUNTER — Other Ambulatory Visit
Admission: RE | Admit: 2015-12-24 | Discharge: 2015-12-24 | Disposition: A | Discharge status: Home / Self Care | Payer: Medicaid Other | Source: Ambulatory Visit | Attending: Pain Medicine | Admitting: Pain Medicine

## 2015-12-24 ENCOUNTER — Encounter: Payer: Self-pay | Admitting: Pain Medicine

## 2015-12-24 ENCOUNTER — Ambulatory Visit: Payer: Medicaid Other | Attending: Pain Medicine | Admitting: Pain Medicine

## 2015-12-24 VITALS — BP 132/82 | HR 72 | Temp 98.6°F | Resp 18 | Ht 66.0 in | Wt 259.0 lb

## 2015-12-24 DIAGNOSIS — G8929 Other chronic pain: Secondary | ICD-10-CM | POA: Diagnosis not present

## 2015-12-24 DIAGNOSIS — F4310 Post-traumatic stress disorder, unspecified: Secondary | ICD-10-CM | POA: Insufficient documentation

## 2015-12-24 DIAGNOSIS — I5032 Chronic diastolic (congestive) heart failure: Secondary | ICD-10-CM

## 2015-12-24 DIAGNOSIS — E782 Mixed hyperlipidemia: Secondary | ICD-10-CM | POA: Insufficient documentation

## 2015-12-24 DIAGNOSIS — Z9884 Bariatric surgery status: Secondary | ICD-10-CM | POA: Insufficient documentation

## 2015-12-24 DIAGNOSIS — F603 Borderline personality disorder: Secondary | ICD-10-CM | POA: Insufficient documentation

## 2015-12-24 DIAGNOSIS — M069 Rheumatoid arthritis, unspecified: Secondary | ICD-10-CM

## 2015-12-24 DIAGNOSIS — M25512 Pain in left shoulder: Secondary | ICD-10-CM

## 2015-12-24 DIAGNOSIS — K449 Diaphragmatic hernia without obstruction or gangrene: Secondary | ICD-10-CM | POA: Insufficient documentation

## 2015-12-24 DIAGNOSIS — M25539 Pain in unspecified wrist: Secondary | ICD-10-CM

## 2015-12-24 DIAGNOSIS — F1190 Opioid use, unspecified, uncomplicated: Secondary | ICD-10-CM

## 2015-12-24 DIAGNOSIS — M19012 Primary osteoarthritis, left shoulder: Secondary | ICD-10-CM | POA: Diagnosis not present

## 2015-12-24 DIAGNOSIS — M549 Dorsalgia, unspecified: Secondary | ICD-10-CM

## 2015-12-24 DIAGNOSIS — M25529 Pain in unspecified elbow: Secondary | ICD-10-CM | POA: Diagnosis not present

## 2015-12-24 DIAGNOSIS — M79643 Pain in unspecified hand: Secondary | ICD-10-CM

## 2015-12-24 DIAGNOSIS — F319 Bipolar disorder, unspecified: Secondary | ICD-10-CM | POA: Insufficient documentation

## 2015-12-24 DIAGNOSIS — Z0189 Encounter for other specified special examinations: Secondary | ICD-10-CM

## 2015-12-24 DIAGNOSIS — Z5181 Encounter for therapeutic drug level monitoring: Secondary | ICD-10-CM

## 2015-12-24 DIAGNOSIS — Z969 Presence of functional implant, unspecified: Secondary | ICD-10-CM | POA: Diagnosis not present

## 2015-12-24 DIAGNOSIS — M542 Cervicalgia: Secondary | ICD-10-CM | POA: Diagnosis not present

## 2015-12-24 DIAGNOSIS — Z90710 Acquired absence of both cervix and uterus: Secondary | ICD-10-CM | POA: Insufficient documentation

## 2015-12-24 DIAGNOSIS — Z79891 Long term (current) use of opiate analgesic: Secondary | ICD-10-CM | POA: Diagnosis not present

## 2015-12-24 DIAGNOSIS — Z6841 Body Mass Index (BMI) 40.0 and over, adult: Secondary | ICD-10-CM | POA: Diagnosis not present

## 2015-12-24 DIAGNOSIS — K580 Irritable bowel syndrome with diarrhea: Secondary | ICD-10-CM | POA: Insufficient documentation

## 2015-12-24 DIAGNOSIS — M25562 Pain in left knee: Secondary | ICD-10-CM | POA: Diagnosis not present

## 2015-12-24 DIAGNOSIS — N139 Obstructive and reflux uropathy, unspecified: Secondary | ICD-10-CM | POA: Insufficient documentation

## 2015-12-24 DIAGNOSIS — Z794 Long term (current) use of insulin: Secondary | ICD-10-CM | POA: Insufficient documentation

## 2015-12-24 DIAGNOSIS — M1991 Primary osteoarthritis, unspecified site: Secondary | ICD-10-CM | POA: Diagnosis not present

## 2015-12-24 DIAGNOSIS — F419 Anxiety disorder, unspecified: Secondary | ICD-10-CM | POA: Insufficient documentation

## 2015-12-24 DIAGNOSIS — M792 Neuralgia and neuritis, unspecified: Secondary | ICD-10-CM

## 2015-12-24 DIAGNOSIS — M25521 Pain in right elbow: Secondary | ICD-10-CM | POA: Insufficient documentation

## 2015-12-24 DIAGNOSIS — E559 Vitamin D deficiency, unspecified: Secondary | ICD-10-CM

## 2015-12-24 DIAGNOSIS — M5030 Other cervical disc degeneration, unspecified cervical region: Secondary | ICD-10-CM | POA: Diagnosis not present

## 2015-12-24 DIAGNOSIS — M25561 Pain in right knee: Secondary | ICD-10-CM | POA: Diagnosis not present

## 2015-12-24 DIAGNOSIS — N318 Other neuromuscular dysfunction of bladder: Secondary | ICD-10-CM | POA: Insufficient documentation

## 2015-12-24 DIAGNOSIS — M19032 Primary osteoarthritis, left wrist: Secondary | ICD-10-CM | POA: Insufficient documentation

## 2015-12-24 DIAGNOSIS — M79673 Pain in unspecified foot: Secondary | ICD-10-CM

## 2015-12-24 DIAGNOSIS — N3946 Mixed incontinence: Secondary | ICD-10-CM | POA: Insufficient documentation

## 2015-12-24 DIAGNOSIS — M25531 Pain in right wrist: Secondary | ICD-10-CM | POA: Insufficient documentation

## 2015-12-24 DIAGNOSIS — M150 Primary generalized (osteo)arthritis: Secondary | ICD-10-CM

## 2015-12-24 DIAGNOSIS — E1365 Other specified diabetes mellitus with hyperglycemia: Secondary | ICD-10-CM | POA: Insufficient documentation

## 2015-12-24 DIAGNOSIS — I5030 Unspecified diastolic (congestive) heart failure: Secondary | ICD-10-CM | POA: Diagnosis not present

## 2015-12-24 DIAGNOSIS — E1140 Type 2 diabetes mellitus with diabetic neuropathy, unspecified: Secondary | ICD-10-CM | POA: Insufficient documentation

## 2015-12-24 DIAGNOSIS — M8949 Other hypertrophic osteoarthropathy, multiple sites: Secondary | ICD-10-CM

## 2015-12-24 DIAGNOSIS — G4733 Obstructive sleep apnea (adult) (pediatric): Secondary | ICD-10-CM | POA: Diagnosis not present

## 2015-12-24 DIAGNOSIS — J449 Chronic obstructive pulmonary disease, unspecified: Secondary | ICD-10-CM | POA: Diagnosis not present

## 2015-12-24 DIAGNOSIS — M8580 Other specified disorders of bone density and structure, unspecified site: Secondary | ICD-10-CM | POA: Insufficient documentation

## 2015-12-24 DIAGNOSIS — E119 Type 2 diabetes mellitus without complications: Secondary | ICD-10-CM | POA: Diagnosis not present

## 2015-12-24 DIAGNOSIS — K219 Gastro-esophageal reflux disease without esophagitis: Secondary | ICD-10-CM | POA: Insufficient documentation

## 2015-12-24 DIAGNOSIS — R339 Retention of urine, unspecified: Secondary | ICD-10-CM | POA: Insufficient documentation

## 2015-12-24 DIAGNOSIS — M25511 Pain in right shoulder: Secondary | ICD-10-CM | POA: Insufficient documentation

## 2015-12-24 DIAGNOSIS — F17210 Nicotine dependence, cigarettes, uncomplicated: Secondary | ICD-10-CM | POA: Insufficient documentation

## 2015-12-24 DIAGNOSIS — M546 Pain in thoracic spine: Secondary | ICD-10-CM

## 2015-12-24 DIAGNOSIS — M79672 Pain in left foot: Secondary | ICD-10-CM | POA: Insufficient documentation

## 2015-12-24 DIAGNOSIS — M79671 Pain in right foot: Secondary | ICD-10-CM | POA: Insufficient documentation

## 2015-12-24 DIAGNOSIS — M25532 Pain in left wrist: Secondary | ICD-10-CM | POA: Insufficient documentation

## 2015-12-24 DIAGNOSIS — Z79899 Other long term (current) drug therapy: Secondary | ICD-10-CM

## 2015-12-24 DIAGNOSIS — M7500 Adhesive capsulitis of unspecified shoulder: Secondary | ICD-10-CM | POA: Insufficient documentation

## 2015-12-24 DIAGNOSIS — E1142 Type 2 diabetes mellitus with diabetic polyneuropathy: Secondary | ICD-10-CM

## 2015-12-24 DIAGNOSIS — E039 Hypothyroidism, unspecified: Secondary | ICD-10-CM | POA: Insufficient documentation

## 2015-12-24 DIAGNOSIS — M159 Polyosteoarthritis, unspecified: Secondary | ICD-10-CM | POA: Insufficient documentation

## 2015-12-24 DIAGNOSIS — E1165 Type 2 diabetes mellitus with hyperglycemia: Secondary | ICD-10-CM | POA: Insufficient documentation

## 2015-12-24 DIAGNOSIS — E876 Hypokalemia: Secondary | ICD-10-CM | POA: Insufficient documentation

## 2015-12-24 DIAGNOSIS — I10 Essential (primary) hypertension: Secondary | ICD-10-CM | POA: Insufficient documentation

## 2015-12-24 DIAGNOSIS — N393 Stress incontinence (female) (male): Secondary | ICD-10-CM | POA: Insufficient documentation

## 2015-12-24 DIAGNOSIS — M25522 Pain in left elbow: Secondary | ICD-10-CM | POA: Insufficient documentation

## 2015-12-24 LAB — COMPREHENSIVE METABOLIC PANEL
ALT: 20 U/L (ref 14–54)
AST: 25 U/L (ref 15–41)
Albumin: 4 g/dL (ref 3.5–5.0)
Alkaline Phosphatase: 77 U/L (ref 38–126)
Anion gap: 8 (ref 5–15)
BUN: 8 mg/dL (ref 6–20)
CO2: 28 mmol/L (ref 22–32)
Calcium: 9.1 mg/dL (ref 8.9–10.3)
Chloride: 101 mmol/L (ref 101–111)
Creatinine, Ser: 0.51 mg/dL (ref 0.44–1.00)
GFR calc Af Amer: >60 mL/min (ref >60)
GFR calc non Af Amer: >60 mL/min (ref >60)
Glucose, Bld: 116 mg/dL — ABNORMAL HIGH (ref 65–99)
Potassium: 2.9 mmol/L — ABNORMAL LOW (ref 3.5–5.1)
Sodium: 137 mmol/L (ref 135–145)
Total Bilirubin: 0.6 mg/dL (ref 0.3–1.2)
Total Protein: 7.8 g/dL (ref 6.5–8.1)

## 2015-12-24 LAB — SEDIMENTATION RATE: Sed Rate: 40 mm/hr — ABNORMAL HIGH (ref 0–30)

## 2015-12-24 LAB — VITAMIN B12: Vitamin B-12: 287 pg/mL (ref 180–914)

## 2015-12-24 LAB — MAGNESIUM: Magnesium: 1.8 mg/dL (ref 1.7–2.4)

## 2015-12-24 LAB — C-REACTIVE PROTEIN: CRP: 0.6 mg/dL (ref <1.0)

## 2015-12-24 LAB — BRAIN NATRIURETIC PEPTIDE: B Natriuretic Peptide: 25 pg/mL (ref 0.0–100.0)

## 2015-12-24 LAB — HEMOGLOBIN A1C: Hgb A1c MFr Bld: 7.1 % — ABNORMAL HIGH (ref 4.0–6.0)

## 2015-12-24 NOTE — Patient Instructions (Signed)
CALL FOR RETURN APPOINTMENT AFTER ALL OTHER TESTING DONE.

## 2015-12-24 NOTE — Progress Notes (Signed)
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 Neuropathic pain, Diabetic peripheral neuropathy (HCC), Encounter for therapeutic drug level monitoring, Encounter for pain management planning, Rheumatoid arthritis, involving unspecified site, unspecified rheumatoid factor presence (HCC), Vitamin D deficiency, Hypomagnesemia, Morbid obesity with BMI of 40.0-44.9, adult (HCC), Primary osteoarthritis involving multiple joints, Chronic knee pain (Bilateral), Chronic foot pain, unspecified laterality, Chronic wrist pain, unspecified laterality, Chronic elbow pain, unspecified laterality, Chronic shoulder pain (Bilateral) (L>R), Chronic neck pain (Bilateral) (R>L), Chronic upper back pain, Chronic hand pain, unspecified laterality, and Rheumatoid arthritis involving multiple joints (HCC) were also pertinent to this visit.  Visit Diagnosis: 1. Chronic pain   2. Diastolic congestive heart failure, unspecified congestive heart failure chronicity (HCC)   3. Chronic diastolic CHF (congestive heart failure), NYHA class 3 (HCC)   4. 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 Long term current use of opiate analgesic   6. Long term prescription opiate use   7. Opiate use   8. Obstructive apnea   9. Neurogenic pain   10. Neuropathic pain   11. Diabetic peripheral neuropathy (HCC)   12. Encounter for therapeutic drug level monitoring   13. Encounter for pain management planning   14. Rheumatoid arthritis, involving unspecified site, unspecified rheumatoid factor presence (HCC)   15. Vitamin D deficiency   16. Hypomagnesemia   17. Morbid obesity with BMI of 40.0-44.9, adult (HCC)   18. Primary osteoarthritis involving multiple joints   19. Chronic knee pain (Bilateral)   20. Chronic foot pain, unspecified laterality   21. Chronic wrist pain, unspecified laterality   22. Chronic elbow pain, unspecified laterality   23. Chronic shoulder pain (Bilateral) (L>R)   24. Chronic neck pain (Bilateral) (R>L)   25. Chronic upper back pain   26. Chronic hand pain, unspecified laterality   27. Rheumatoid arthritis involving multiple joints (HCC)     Assessment: No problem-specific Assessment & Plan notes found for this encounter.   Plan of Care  Initial Treatment Plan:  Please be advised that as per protocol, today's visit has been an evaluation only. We have not taken over the patient's controlled substance management.  Problem List Items Addressed This Visit      High   Chronic elbow pain (Location of Tertiary source of pain) (Bilateral) (L>R) (Chronic)   Relevant Medications   traZODone (DESYREL) 150 MG tablet   FLUoxetine (PROZAC) 20 MG capsule   pregabalin (LYRICA) 150 MG capsule   Other Relevant Orders   DG Elbow 2 Views Left (Completed)   DG Elbow 2 Views Right (  Completed)   Chronic foot pain (Location of Primary Source of Pain) (Bilateral) (L>R) (Chronic)   Relevant Orders   DG Foot 2 Views Left   DG Foot 2 Views Right   Chronic hand pain (Bilateral) (L>R) (Chronic)   Relevant Medications   traZODone (DESYREL) 150 MG  tablet   FLUoxetine (PROZAC) 20 MG capsule   pregabalin (LYRICA) 150 MG capsule   Chronic knee pain (Bilateral) (R>L) (Chronic)   Relevant Medications   traZODone (DESYREL) 150 MG tablet   FLUoxetine (PROZAC) 20 MG capsule   pregabalin (LYRICA) 150 MG capsule   Other Relevant Orders   DG Knee 1-2 Views Left   DG Knee 1-2 Views Right   Chronic neck pain (Bilateral) (R>L) (Chronic)   Relevant Medications   traZODone (DESYREL) 150 MG tablet   FLUoxetine (PROZAC) 20 MG capsule   pregabalin (LYRICA) 150 MG capsule   Other Relevant Orders   DG Cervical Spine Complete (Completed)   Chronic pain - Primary (Chronic)   Relevant Medications   traZODone (DESYREL) 150 MG tablet   FLUoxetine (PROZAC) 20 MG capsule   pregabalin (LYRICA) 150 MG capsule   Other Relevant Orders   Ambulatory referral to Psychology   Comprehensive metabolic panel (Completed)   C-reactive protein (Completed)   Magnesium (Completed)   Sedimentation rate (Completed)   Vitamin B12 (Completed)   25-Hydroxyvitamin D Lcms D2+D3   Amb ref to Medical Nutrition Therapy-MNT   Ambulatory referral to General Surgery   Chronic shoulder pain (Bilateral) (L>R) (Chronic)   Relevant Orders   DG Shoulder Left (Completed)   DG Shoulder Right (Completed)   Chronic upper back pain (Chronic)   Chronic wrist pain (Location of Secondary source of pain) (Bilateral) (L>R) (Chronic)   Relevant Medications   traZODone (DESYREL) 150 MG tablet   FLUoxetine (PROZAC) 20 MG capsule   pregabalin (LYRICA) 150 MG capsule   Other Relevant Orders   DG Wrist 2 Views Left (Completed)   DG Wrist 2 Views Right (Completed)   Diabetic peripheral neuropathy (HCC) (Chronic)   Relevant Medications   ARIPiprazole (ABILIFY) 5 MG tablet   traZODone (DESYREL) 150 MG tablet   FLUoxetine (PROZAC) 20 MG capsule   pregabalin (LYRICA) 150 MG capsule   Other Relevant Orders   Hemoglobin A1c (Completed)   NCV with EMG(electromyography)   Neurogenic pain  (Chronic)   Neuropathic pain (Chronic)   Osteoarthritis, multiple sites (Chronic)   Relevant Medications   methotrexate 50 MG/2ML injection   Other Relevant Orders   Amb ref to Medical Nutrition Therapy-MNT   Ambulatory referral to General Surgery   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[employer]) (Chronic)   Rheumatoid arthritis (HCC) (Chronic)   Relevant Medications   methotrexate 50 MG/2ML injection   Other Relevant Orders   C-reactive protein (Completed)   Sedimentation rate (Completed)   ANA Comprehensive Panel (Completed)   Rheumatoid Arthritis Profile   Rheumatoid arthritis involving multiple joints (HCC) (Chronic)   Relevant Medications   methotrexate 50 MG/2ML injection     Medium   Encounter for pain management planning   Encounter for therapeutic drug level monitoring   Long term current use of opiate analgesic (Chronic)   Long term prescription opiate use (Chronic)   Relevant Orders   Ambulatory referral to Psychology   Compliance Drug Analysis, Ur   Obstructive apnea   Opiate use (60 MME/Day) (Chronic)     Low   Chronic diastolic CHF (congestive heart failure), NYHA class 3 (HCC)   Relevant  Orders   Brain natriuretic peptide (Completed)   Hypomagnesemia   Relevant Orders   Magnesium (Completed)   Morbid obesity with BMI of 40.0-44.9, adult (HCC) (Chronic)   Relevant Orders   Amb ref to Medical Nutrition Therapy-MNT   Ambulatory referral to General Surgery   Vitamin D deficiency   Relevant Orders   25-Hydroxyvitamin D Lcms D2+D3    Other Visit Diagnoses    Diastolic congestive heart failure, unspecified congestive heart failure chronicity (HCC)       Relevant Orders   Brain natriuretic peptide (Completed)      Pharmacotherapy (Medications Ordered): No orders of the defined types were placed in this encounter.   Lab-work & Procedure Ordered: Orders Placed This Encounter  Procedures  . DG Foot 2 Views Left  . DG Foot 2 Views Right   . DG Knee 1-2 Views Left  . DG Knee 1-2 Views Right  . DG Wrist 2 Views Left  . DG Wrist 2 Views Right  . DG Elbow 2 Views Left  . DG Elbow 2 Views Right  . DG Shoulder Left  . DG Shoulder Right  . DG Cervical Spine Complete  . Compliance Drug Analysis, Ur  . Comprehensive metabolic panel  . C-reactive protein  . Magnesium  . Sedimentation rate  . Vitamin B12  . 25-Hydroxyvitamin D Lcms D2+D3  . Brain natriuretic peptide  . Hemoglobin A1c  . ANA Comprehensive Panel  . Rheumatoid Arthritis Profile  . Ambulatory referral to Psychology  . Amb ref to Medical Nutrition Therapy-MNT  . Ambulatory referral to General Surgery  . NCV with EMG(electromyography)    Interventional Therapies: Scheduled:  None at this time.    Considering:   IV lidocaine infusions.  Intra-articular injection with local anesthetic and steroids.  Diagnostic bilateral intra-articular shoulder joint injection under fluoroscopic guidance, with or without sedation.  Diagnostic bilateral intra-articular knee injections with local anesthetic and steroid, under fluoroscopic guidance, no sedation.  Diagnostic right-sided cervical epidural steroid injections under fluoroscopic guidance and IV sedation.  Diagnostic bilateral  Cervical facet block under fluoroscopic guidance and IV sedation.  Possible bilateral cervical facet radiofrequency ablation.    PRN Procedures:  None at this time.    Referral(s) or Consult(s): Medical psychology consult for substance use disorder evaluation  Medications administered during this visit: Ms. Guadron had no medications administered during this visit.  Prescriptions ordered during this visit: New Prescriptions   No medications on file    Requested PM Follow-up: Return for 2nd Visit Eval, After [facility] Eval.  No future appointments.   Primary Care Physician: WHITE, Valentina Shaggy, FNP Location: [facility] Note by: Sydnee Levans. Laban Emperor, M.D,  DABA, DABAPM, DABPM, DABIPP, FIPP  Pain Score Disclaimer: We use the NRS-11 scale. This is a self-reported, subjective measurement of pain severity with only modest accuracy. It is used primarily to identify changes within a particular patient. It must be understood that outpatient pain scales are significantly less accurate that those used for research, where they can be applied under ideal controlled circumstances with minimal exposure to variables. In reality, the score is likely to be a combination of pain intensity and pain affect, where pain affect describes the degree of emotional arousal or changes in action readiness caused by the sensory experience of pain. Factors such as social and work situation, setting, emotional state, anxiety levels, expectation, and prior pain experience may influence pain perception and show large inter-individual differences that may also be affected by time  variables.  Patient instructions provided during this appointment: Patient Instructions  CALL FOR RETURN APPOINTMENT AFTER ALL OTHER TESTING DONE.

## 2015-12-25 DIAGNOSIS — M069 Rheumatoid arthritis, unspecified: Secondary | ICD-10-CM | POA: Insufficient documentation

## 2015-12-25 DIAGNOSIS — M549 Dorsalgia, unspecified: Secondary | ICD-10-CM

## 2015-12-25 DIAGNOSIS — G8929 Other chronic pain: Secondary | ICD-10-CM | POA: Insufficient documentation

## 2015-12-25 DIAGNOSIS — M79643 Pain in unspecified hand: Secondary | ICD-10-CM

## 2015-12-25 LAB — ANA COMPREHENSIVE PANEL
Anti JO-1: <0.2 AI (ref 0.0–0.9)
Centromere Ab Screen: <0.2 AI (ref 0.0–0.9)
Chromatin Ab SerPl-aCnc: 0.3 AI (ref 0.0–0.9)
ENA SM Ab Ser-aCnc: <0.2 AI (ref 0.0–0.9)
Ribonucleic Protein: 0.3 AI (ref 0.0–0.9)
SSA (Ro) (ENA) Antibody, IgG: 0.4 AI (ref 0.0–0.9)
SSB (La) (ENA) Antibody, IgG: <0.2 AI (ref 0.0–0.9)
Scleroderma (Scl-70) (ENA) Antibody, IgG: 0.2 AI (ref 0.0–0.9)
ds DNA Ab: <1 IU/mL (ref 0–9)

## 2015-12-25 LAB — RHEUMATOID ARTHRITIS PROFILE
CCP Antibodies IgG/IgA: 131 units — ABNORMAL HIGH (ref 0–19)
Rhuematoid fact SerPl-aCnc: 114 IU/mL — ABNORMAL HIGH (ref 0.0–13.9)

## 2015-12-28 ENCOUNTER — Encounter: Payer: Self-pay | Admitting: Pain Medicine

## 2015-12-28 LAB — 25-HYDROXY VITAMIN D LCMS D2+D3
25-Hydroxy, Vitamin D-2: 27 ng/mL
25-Hydroxy, Vitamin D-3: 12 ng/mL
25-Hydroxy, Vitamin D: 39 ng/mL

## 2015-12-28 NOTE — Progress Notes (Signed)
Normal levels of Rheumatoid Factor (RF) are between 0.0 - 13.9 IU/mL.  The RF test must be interpreted in conjunction with a person's symptoms and clinical history.  In those with symptoms and clinical signs of rheumatoid arthritis, the presence of significant concentrations of RF indicates that it is likely that they have RA. Higher levels of RF generally correlate with more severe disease and a poorer prognosis.  A negative RF test does not rule out Rheumatoid Arthritis. About 20% of people with RA will have very low levels of or no detectable RF levels. In these cases, a cyclic citrullinated peptide (CCP) antibody test may be positive and used to confirm RA.  Positive RF test results may also be seen in 1-5% of healthy people and in some people with conditions such as: Sjgren syndrome, scleroderma, systemic lupus erythematosus (lupus), sarcoidosis, endocarditis, tuberculosis, syphilis, HIV/AIDS, hepatitis, infectious mononucleosis, cancers such as leukemia and multiple myeloma, parasitic infection, or disease of the liver, lung, or kidney. The RF test is not used to diagnose or monitor these other conditions.

## 2015-12-28 NOTE — Progress Notes (Signed)
- 

## 2015-12-28 NOTE — Progress Notes (Signed)
Hemoglobin A1c, often abbreviated HbA1c, is a form of hemoglobin (a blood pigment that carries oxygen) that is bound to glucose. Blood HbA1c levels are reflective of how well diabetes is controlled. The normal range for level for hemoglobin A1c is less than 6%. HbA1c also is known as glycosylated, or glycated hemoglobin. HbA1c levels are reflective of blood glucose levels over the past six to eight weeks and do not reflect daily ups and downs of blood glucose.  A1c and Blood Sugar A1c (%)  Average Blood Sugar (mg/dl) 4  68 5  97 6  126 7  152 8  183 9  212 10  240 11  269 12  298 13  326 14  355

## 2015-12-28 NOTE — Progress Notes (Signed)
Potassium levels below 3.6 mmol/L are considered to be low. Levels (less than 2.5 mmol/L) can be life-threatening and requires urgent medical attention. Low potassium (hypokalemia) has many causes. The most common cause is excessive potassium loss in urine due to prescription water or fluid pills (diuretics). Vomiting or diarrhea or both can result in excessive potassium loss from the digestive tract. Causes of potassium loss leading to low potassium include: chronic kidney disease; diabetic ketoacidosis; diarrhea; excessive alcohol use; excessive laxative use; excessive sweating; folic acid deficiency; diuretics; primary aldosteronism; vomiting; and/or some antibiotic use. Normal fasting (NPO x 8 hours) glucose levels are between 65-99 mg/dl, with 2 hour fasting, levels are usually less than 140 mg/dl. Any random blood glucose level greater than 200 mg/dl is considered to be Diabetes.

## 2015-12-31 LAB — COMPLIANCE DRUG ANALYSIS, UR

## 2016-01-05 ENCOUNTER — Ambulatory Visit (INDEPENDENT_AMBULATORY_CARE_PROVIDER_SITE_OTHER): Payer: Medicaid Other | Admitting: Psychiatry

## 2016-01-05 ENCOUNTER — Encounter: Payer: Self-pay | Admitting: Psychiatry

## 2016-01-05 VITALS — BP 118/80 | HR 75 | Temp 98.7°F | Ht 66.0 in | Wt 270.2 lb

## 2016-01-05 DIAGNOSIS — F331 Major depressive disorder, recurrent, moderate: Secondary | ICD-10-CM | POA: Diagnosis not present

## 2016-01-05 MED ORDER — BUSPIRONE HCL 10 MG PO TABS: 10.0000 mg | ORAL_TABLET | Freq: Two times a day (BID) | ORAL | 5 refills | Status: DC

## 2016-01-05 MED ORDER — ZOLPIDEM TARTRATE 5 MG PO TABS: 5.0000 mg | ORAL_TABLET | Freq: Every evening | ORAL | 5 refills | Status: DC | PRN

## 2016-01-05 MED ORDER — FLUOXETINE HCL 20 MG PO CAPS: 20.0000 mg | ORAL_CAPSULE | Freq: Every day | ORAL | 5 refills | Status: DC

## 2016-01-05 MED ORDER — ARIPIPRAZOLE 5 MG PO TABS: 5.0000 mg | ORAL_TABLET | Freq: Every day | ORAL | 5 refills | Status: DC

## 2016-01-05 NOTE — Progress Notes (Signed)
Follow-up for 55 year old woman with history of chronic depression and anxiety. Reports she is doing much better. Has lost significant weight. He is taking a much more positive attitude. Social life has improved. No symptoms of depression and a long time. No suicidal thoughts. She does complain of some difficulty sleeping frequently. Patient is neatly groomed and dressed. Good eye contact. Normal psychomotor activity. Speech normal rate tone and volume. Affect euthymic. Memory intact short and long-term. Full 9 point review of systems negative.  Review medication. Tolerating medicine well. No indication to change anything right now. Refill medication. She is also interested in getting something to sleep area trazodone was not working. Try Ambien 5 mg at night as needed for sleep but strongly encourage her to use it less than 7 nights a week. Follow-up 6 months.

## 2016-01-09 ENCOUNTER — Telehealth: Payer: Self-pay

## 2016-01-09 NOTE — Telephone Encounter (Signed)
Spoke with [facility] office.  They state [name] [name] is seeing [name] [name] for her care.  Informed the office that patient had labwork that needed to be reviewed by PCP.  Informed office that I would be faxing over lab reports to them today and to please relay these to the care provider.

## 2016-01-09 NOTE — Telephone Encounter (Signed)
Attempted to call patient to inform her of her labwork.  Left message for her to call the office.

## 2016-02-04 ENCOUNTER — Ambulatory Visit: Payer: Medicaid Other | Admitting: Dietician

## 2016-02-09 ENCOUNTER — Ambulatory Visit (INDEPENDENT_AMBULATORY_CARE_PROVIDER_SITE_OTHER): Payer: Medicaid Other | Admitting: Psychiatry

## 2016-02-09 ENCOUNTER — Encounter: Payer: Self-pay | Admitting: Psychiatry

## 2016-02-09 VITALS — BP 133/83 | HR 76 | Temp 98.5°F | Wt 273.4 lb

## 2016-02-09 DIAGNOSIS — F331 Major depressive disorder, recurrent, moderate: Secondary | ICD-10-CM

## 2016-02-09 NOTE — Progress Notes (Signed)
"  That Abilify was making me feel weird". Patient reports that after our last visit her agitation increased to where it was clearly a problem. She was spending excessive money. Mood was euphoric. Sounds like she was hypomanic but without any psychosis. Symptoms have eased off now. For some reason she blames this on her Abilify which she has stopped for the last few days. She denies any suicidal thoughts. Mood is back to feeling more stable. Not depressed. No psychotic symptoms currently no evidence of dangerousness. No other change to medications.  Neatly dressed and groomed. Good eye contact. Normal psychomotor activity. Speech normal rate and tone. Affect euthymic. Mood stated as okay. Thoughts lucid without loosening of associations. No evidence of delusions. Good judgment and insight. I agreed for her to stay off the Abilify. Continue other medication as prescribed. Follow up in about 3 months. Review of plan and patient agrees.

## 2016-02-20 ENCOUNTER — Emergency Department: Payer: Medicaid Other

## 2016-02-20 ENCOUNTER — Emergency Department
Admission: EM | Admit: 2016-02-20 | Discharge: 2016-02-20 | Disposition: A | Discharge status: Home / Self Care | Payer: Medicaid Other | Attending: Emergency Medicine | Admitting: Emergency Medicine

## 2016-02-20 ENCOUNTER — Encounter: Payer: Self-pay | Admitting: Urgent Care

## 2016-02-20 DIAGNOSIS — E039 Hypothyroidism, unspecified: Secondary | ICD-10-CM | POA: Diagnosis not present

## 2016-02-20 DIAGNOSIS — Z7984 Long term (current) use of oral hypoglycemic drugs: Secondary | ICD-10-CM | POA: Insufficient documentation

## 2016-02-20 DIAGNOSIS — Z794 Long term (current) use of insulin: Secondary | ICD-10-CM | POA: Insufficient documentation

## 2016-02-20 DIAGNOSIS — Z87891 Personal history of nicotine dependence: Secondary | ICD-10-CM | POA: Insufficient documentation

## 2016-02-20 DIAGNOSIS — J449 Chronic obstructive pulmonary disease, unspecified: Secondary | ICD-10-CM | POA: Insufficient documentation

## 2016-02-20 DIAGNOSIS — I110 Hypertensive heart disease with heart failure: Secondary | ICD-10-CM | POA: Insufficient documentation

## 2016-02-20 DIAGNOSIS — I5032 Chronic diastolic (congestive) heart failure: Secondary | ICD-10-CM | POA: Diagnosis not present

## 2016-02-20 DIAGNOSIS — M25551 Pain in right hip: Secondary | ICD-10-CM | POA: Diagnosis not present

## 2016-02-20 DIAGNOSIS — E119 Type 2 diabetes mellitus without complications: Secondary | ICD-10-CM | POA: Diagnosis not present

## 2016-02-20 DIAGNOSIS — Z79899 Other long term (current) drug therapy: Secondary | ICD-10-CM | POA: Diagnosis not present

## 2016-02-20 DIAGNOSIS — R52 Pain, unspecified: Secondary | ICD-10-CM

## 2016-02-20 LAB — CBC
HCT: 37.9 % (ref 35.0–47.0)
Hemoglobin: 12.8 g/dL (ref 12.0–16.0)
MCH: 25.9 pg — ABNORMAL LOW (ref 26.0–34.0)
MCHC: 33.9 g/dL (ref 32.0–36.0)
MCV: 76.4 fL — ABNORMAL LOW (ref 80.0–100.0)
Platelets: 235 10*3/uL (ref 150–440)
RBC: 4.95 MIL/uL (ref 3.80–5.20)
RDW: 16.9 % — ABNORMAL HIGH (ref 11.5–14.5)
WBC: 11.6 10*3/uL — ABNORMAL HIGH (ref 3.6–11.0)

## 2016-02-20 LAB — COMPREHENSIVE METABOLIC PANEL
ALT: 16 U/L (ref 14–54)
AST: 19 U/L (ref 15–41)
Albumin: 3.7 g/dL (ref 3.5–5.0)
Alkaline Phosphatase: 67 U/L (ref 38–126)
Anion gap: 7 (ref 5–15)
BUN: 11 mg/dL (ref 6–20)
CO2: 27 mmol/L (ref 22–32)
Calcium: 8.8 mg/dL — ABNORMAL LOW (ref 8.9–10.3)
Chloride: 102 mmol/L (ref 101–111)
Creatinine, Ser: 0.49 mg/dL (ref 0.44–1.00)
GFR calc Af Amer: >60 mL/min (ref >60)
GFR calc non Af Amer: >60 mL/min (ref >60)
Glucose, Bld: 134 mg/dL — ABNORMAL HIGH (ref 65–99)
Potassium: 3.8 mmol/L (ref 3.5–5.1)
Sodium: 136 mmol/L (ref 135–145)
Total Bilirubin: 0.6 mg/dL (ref 0.3–1.2)
Total Protein: 7.5 g/dL (ref 6.5–8.1)

## 2016-02-20 LAB — SEDIMENTATION RATE: Sed Rate: 46 mm/hr — ABNORMAL HIGH (ref 0–30)

## 2016-02-20 MED ORDER — ONDANSETRON HCL 4 MG/2ML IJ SOLN
INTRAMUSCULAR | Status: AC
  Administered 2016-02-20: 4 mg via INTRAVENOUS
  Filled 2016-02-20: qty 2

## 2016-02-20 MED ORDER — ONDANSETRON HCL 4 MG/2ML IJ SOLN
4.0000 mg | Freq: Once | INTRAMUSCULAR | Status: AC
  Administered 2016-02-20: 4 mg via INTRAVENOUS

## 2016-02-20 MED ORDER — MORPHINE SULFATE (PF) 2 MG/ML IV SOLN
4.0000 mg | Freq: Once | INTRAVENOUS | Status: AC
  Administered 2016-02-20: 4 mg via INTRAVENOUS
  Filled 2016-02-20: qty 2

## 2016-02-20 MED ORDER — OXYCODONE-ACETAMINOPHEN 5-325 MG PO TABS: 1.0000 | ORAL_TABLET | ORAL | 0 refills | Status: DC | PRN

## 2016-02-20 NOTE — ED Notes (Signed)
Patient observed resting quietly in bed with NAD noted. RN did not awaken patient at this time.

## 2016-02-20 NOTE — ED Triage Notes (Signed)
Patient presents to the ED via EMS from home. Patient reporting increasing pain since yesterday; mainly in RIGHT hip. Patient reports that she was like this x 5-6 years ago and she was "septic". Followed at [facility].

## 2016-02-20 NOTE — ED Notes (Signed)
RN to bedside to administer MD ordered Morphine. Patient reporting that she needs 50mg  of IV Phenergan with the Morphine in order for her to tolerate it and for it to be effective. RN advised patient that this was a lot of medication for her, especially with her blood pressure being what it is, however the MD would be consulted. Dr. made aware. MD to bedside to speak with patient once again. The same information that was communicated to patient by this RN was reiterated by the MD. MD with [facility] for Zofran 4mg  IVP; to be entered and carried by this RN.

## 2016-02-20 NOTE — ED Notes (Signed)
MD placed order for MRI of pelvis. 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 MD made aware.

## 2016-02-20 NOTE — ED Notes (Signed)
Patient to radiology at this time for MD ordered studies.

## 2016-02-20 NOTE — ED Notes (Signed)
Patient returned to ED 4 from radiology. MD aware that patient back in the department.

## 2016-02-20 NOTE — ED Provider Notes (Signed)
[facility] Emergency Department Provider Note   First MD Initiated Contact with Patient 02/20/16 [phone]     (approximate)  I have reviewed the triage vital signs and the nursing notes.   HISTORY  Chief Complaint Hip Pain   HPI [name] [name] is a 55 y.o. female presents with nontraumatic right hip pain 2 days. Patient denies any fever no nausea or vomiting. Patient denies any abdominal pain. Patient states that pain is consistent with when she had a septic hip 5 years ago. Patient states her current pain is 10 out of 10 worse with movement.   Past Medical History:  Diagnosis Date  . Abdominal wall hernia 01/29/2013  . Anxiety   . Arthritis    Rheumatoid  . C. difficile colitis   . Chronic diastolic heart failure (HCC)   . Depression   . Diabetes mellitus    states no meds or diet restrictions  at present  . Diarrhea 10/22/2015  . Diastolic CHF (HCC)   . Esophagitis   . Fluid retention   . GERD (gastroesophageal reflux disease)   . Hiatal hernia   . Hypertension   . Hypokalemia due to loss of potassium 10/21/2015   Overview:  Associated with 3 weeks of diarrhea  And QT prolongation.  . Hypothyroidism   . IBS (irritable bowel syndrome)   . Moderate episode of recurrent major depressive disorder (HCC) 06/03/2004  . Morbid obesity (HCC)   . Nausea & vomiting 08/04/2011  . Neurogenic bladder    has pacemaker  . Neuropathy (HCC)   . Obesity   . Panic attacks   . Rheumatoid arthritis (HCC)   . Sleep apnea    STATES SEVERE, CANT TOLERATE MASK- LAST STUDY YEARS AGO    Patient Active Problem List   Diagnosis Date Noted  . Chronic upper back pain 12/25/2015  . Chronic hand pain (Bilateral) (L>R) 12/25/2015  . Rheumatoid arthritis involving multiple joints (HCC) 12/25/2015  . Morbid obesity with BMI of 40.0-44.9, adult (HCC) 12/24/2015  . Osteoarthritis, multiple sites 12/24/2015  . Chronic foot pain (Location of Primary Source of Pain) (Bilateral)  (L>R) 12/24/2015  . Chronic elbow pain (Location of Tertiary source of pain) (Bilateral) (L>R) 12/24/2015  . Chronic shoulder pain (Bilateral) (L>R) 12/24/2015  . Chronic neck pain (Bilateral) (R>L) 12/24/2015  . 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[employer]) 12/23/2015  . Chronic knee pain (Bilateral) (R>L) 12/23/2015  . Long term current use of opiate analgesic 12/23/2015  . Long term prescription opiate use 12/23/2015  . Opiate use (60 MME/Day) 12/23/2015  . Chronic pain 12/23/2015  . Neurogenic pain 12/23/2015  . Neuropathic pain 12/23/2015  . Diabetic peripheral neuropathy (HCC) 12/23/2015  . Encounter for therapeutic drug level monitoring 12/23/2015  . Encounter for pain management planning 12/23/2015  . GERD (gastroesophageal reflux disease) 11/25/2015  . Hypokalemia 10/21/2015  . Hypomagnesemia 10/21/2015  . QT prolongation 10/21/2015  . Type 2 diabetes mellitus with hyperglycemia (HCC) 10/21/2015  . Chronic wrist pain (Location of Secondary source of pain) (Bilateral) (L>R) 03/19/2015  . 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 Female genuine stress incontinence 02/14/2015  . Urge incontinence of urine 02/14/2015  . Obstructive apnea 07/03/2014  . Chronic diastolic CHF (congestive heart failure), NYHA class 3 (HCC) 07/03/2014  . Anxiety 01/03/2014  . Diabetes mellitus (HCC) 01/03/2014  . COPD (chronic obstructive pulmonary disease) (HCC) 01/03/2014  . Bipolar affective disorder (HCC) 01/03/2014  . Diastolic dysfunction 01/03/2014  . Combined fat and carbohydrate induced hyperlipemia  01/03/2014  . Shortness of breath 01/03/2014  . Incomplete bladder emptying 11/02/2012  . Bladder retention 10/10/2012  . Detrusor muscle hypertonia 10/04/2012  . Obstruction of urinary tract 10/04/2012  . FOM (frequency of micturition) 10/04/2012  . Vitamin D deficiency 04/15/2012  . Borderline personality disorder 01/06/2012  . Mixed anxiety depressive disorder 01/06/2012  .  History of laparoscopic adjustable gastric banding, 03/20/2007.  Removed 09/19/2011. 08/04/2011  . Adult hypothyroidism 06/28/2010  . Rheumatoid arthritis (HCC) 06/28/2010  . Essential (primary) hypertension 01/27/2005    Past Surgical History:  Procedure Laterality Date  . ABDOMINAL HYSTERECTOMY    . CHOLECYSTECTOMY    . EYE SURGERY     bilateral cataract extraction with IOL  . HERNIA REPAIR     ventral hernia with strangulation  . LAPAROSCOPIC GASTRIC BANDING  03/20/07  . TONSILLECTOMY    . TUBAL LIGATION      Prior to Admission medications   Medication Sig Start Date End Date Taking? Authorizing Provider  Abatacept (ORENCIA [place]) Inject into the vein every 30 (thirty) days. Every 2 weeks    Historical Provider, MD  atorvastatin (LIPITOR) 80 MG tablet Take 80 mg by mouth daily.    Historical Provider, MD  busPIRone (BUSPAR) 10 MG tablet Take 1 tablet (10 mg total) by mouth 2 (two) times daily. 01/05/16   Audery Amel, MD  Calcium Carb-Ergocalciferol 250-125 MG-UNIT TABS Take by mouth.    Historical Provider, MD  Cholecalciferol (VITAMIN D-1000 MAX ST) 1000 UNITS tablet Take by mouth.    Historical Provider, MD  clotrimazole-betamethasone (LOTRISONE) cream Apply 1 application topically 2 (two) times daily. For yeast infection under stomach 05/07/11   Historical Provider, MD  famotidine (PEPCID) 20 MG tablet Take by mouth.    Historical Provider, MD  FLUoxetine (PROZAC) 20 MG capsule Take 1 capsule (20 mg total) by mouth daily. 01/05/16   Audery Amel, MD  folic acid (FOLVITE) 1 MG tablet Take two tablets to make 2mg .    Historical Provider, MD  hydroxychloroquine (PLAQUENIL) 200 MG tablet Take 200 mg by mouth 2 (two) times daily.    Historical Provider, MD  hydrOXYzine (ATARAX/VISTARIL) 50 MG tablet Take 50 mg by mouth Twice daily.  07/07/11   Historical Provider, MD  Insulin Aspart (NOVOLOG [place]) Inject 10 Units into the skin 2 (two) times daily. Injection twice daily    Historical  Provider, MD  KLOR-CON M20 20 MEQ tablet TAKE ONE TABLET BY MOUTH TWICE DAILY Patient taking differently: take 2 tabs in the a.m. , 1 tab in the evening 04/10/13   04/12/13, MD  leflunomide (ARAVA) 20 MG tablet Take 20 mg by mouth daily. Reported on 09/12/2015    Historical Provider, MD  levothyroxine (SYNTHROID, LEVOTHROID) 88 MCG tablet Take 88 mcg by mouth daily before breakfast.    Historical Provider, MD  lisinopril (PRINIVIL,ZESTRIL) 2.5 MG tablet Take 2.5 mg by mouth daily.    Historical Provider, MD  lovastatin (MEVACOR) 40 MG tablet Take 40 mg by mouth.    Historical Provider, MD  meloxicam (MOBIC) 7.5 MG tablet Take 1 tablet (7.5 mg total) by mouth 2 (two) times daily after a meal. 07/22/15   09/21/15, MD  metFORMIN (GLUCOPHAGE) 500 MG tablet Take 1,000 mg by mouth daily.     Historical Provider, MD  methotrexate 50 MG/2ML injection Inject 1 mL into the muscle once a week.    Historical Provider, MD  oxyCODONE-acetaminophen (ROXICET) 5-325 MG tablet Take 1 tablet by  mouth every 4 (four) hours as needed for severe pain. 02/20/16   Darci Currentandolph N Jadine Brumley, MD  predniSONE (DELTASONE) 10 MG tablet Take 5 mg by mouth daily as needed. Reported on 08/29/2015    Historical Provider, MD  pregabalin (LYRICA) 150 MG capsule Take by mouth. 12/23/15 01/22/16  Historical Provider, MD  promethazine (PHENERGAN) 25 MG tablet Take 25 mg by mouth every 6 (six) hours as needed. For nausea    Historical Provider, MD  tamsulosin (FLOMAX) 0.4 MG CAPS Take 0.4 mg by mouth daily.    Historical Provider, MD  Vitamin D, Ergocalciferol, (DRISDOL) 50000 units CAPS capsule Take by mouth.    Historical Provider, MD  zolpidem (AMBIEN) 5 MG tablet Take 1 tablet (5 mg total) by mouth at bedtime as needed for sleep. 01/05/16   Audery AmelJohn T Clapacs, MD    Allergies Codeine; Propoxyphene; Propoxyphene; Sulfa antibiotics; Hydrocodone; and Sulfasalazine  Family History  Problem Relation Age of Onset  . Heart failure Father   .  Bipolar disorder Father   . Alcohol abuse Father   . Anxiety disorder Father   . Depression Father   . Heart disease Brother   . Heart attack Brother [id number]    MI s/p stents placed  . Anxiety disorder Sister   . Depression Sister   . Anxiety disorder Sister   . Depression Sister   . Bipolar disorder Sister   . Alcohol abuse Sister   . Drug abuse Sister   . Heart attack Brother     Social History Social History  Substance Use Topics  . Smoking status: Former Smoker    Packs/day: 2.00    Years: 27.00    Types: Cigarettes    Quit date: 07/30/1999  . Smokeless tobacco: Never Used  . Alcohol use No    Review of Systems Constitutional: No fever/chills Eyes: No visual changes. ENT: No sore throat. Cardiovascular: Denies chest pain. Respiratory: Denies shortness of breath. Gastrointestinal: No abdominal pain.  No nausea, no vomiting.  No diarrhea.  No constipation. Genitourinary: Negative for dysuria. Musculoskeletal: Negative for back pain. Positive for right hip pain Skin: Negative for rash. Neurological: Negative for headaches, focal weakness or numbness.  10-point ROS otherwise negative.  ____________________________________________   PHYSICAL EXAM:  VITAL SIGNS: ED Triage Vitals  Enc Vitals Group     BP 02/20/16 0439 132/88     Pulse Rate 02/20/16 0439 82     Resp 02/20/16 0439 20     Temp 02/20/16 0439 98.6 F (37 C)     Temp Source 02/20/16 0439 Oral     SpO2 02/20/16 0439 98 %     Weight 02/20/16 0440 273 lb (123.8 kg)     Height --      Head Circumference --      Peak Flow --      Pain Score 02/20/16 0440 10     Pain Loc --      Pain Edu? --      Excl. in GC? --     Constitutional: Alert and oriented. Apparent discomfort Eyes: Conjunctivae are normal. PERRL. EOMI. Head: Atraumatic. Mouth/Throat: Mucous membranes are moist.  Oropharynx non-erythematous. Neck: No stridor. No cervical spine tenderness to palpation. Cardiovascular: Normal rate, regular  rhythm. Good peripheral circulation. Grossly normal heart sounds. Respiratory: Normal respiratory effort.  No retractions. Lungs CTAB. Gastrointestinal: Soft and nontender. No distention.  Musculoskeletal: Pain with very gentle right hip palpation. No knee or ankle pain on exam. Neurologic:  Normal speech  and language. No gross focal neurologic deficits are appreciated.  Skin:  Skin is warm, dry and intact. No rash noted. Psychiatric: Mood and affect are normal. Speech and behavior are normal.  ____________________________________________   LABS (all labs ordered are listed, but only abnormal results are displayed)  Labs Reviewed  CBC - Abnormal; Notable for the following:       Result Value   WBC 11.6 (*)    MCV 76.4 (*)    MCH 25.9 (*)    RDW 16.9 (*)    All other components within normal limits  COMPREHENSIVE METABOLIC PANEL - Abnormal; Notable for the following:    Glucose, Bld 134 (*)    Calcium 8.8 (*)    All other components within normal limits  SEDIMENTATION RATE - Abnormal; Notable for the following:    Sed Rate 46 (*)    All other components within normal limits  CULTURE, BLOOD (ROUTINE X 2)  CULTURE, BLOOD (ROUTINE X 2)    RADIOLOGY I, [place] Evalyne Cortopassi, personally viewed and evaluated these images (plain radiographs) as part of my medical decision making, as well as reviewing the written report by the radiologist.  No results found.    Procedures     INITIAL IMPRESSION / ASSESSMENT AND PLAN / ED COURSE  Pertinent labs & imaging results that were available during my care of the patient were reviewed by me and considered in my medical decision making (see chart for details).  Patient x-ray revealed bilateral hip osteoarthrosis. Sedimentation rate elevated at 46 however comparison sedimentation rate from September was 40. We'll obtain MRI of the right hip for further evaluation   Clinical Course     ____________________________________________  FINAL  CLINICAL IMPRESSION(S) / ED DIAGNOSES  Final diagnoses:  Right hip pain     MEDICATIONS GIVEN DURING THIS VISIT:  Medications  morphine 2 MG/ML injection 4 mg (4 mg Intravenous Given 02/20/16 0448)  ondansetron (ZOFRAN) injection 4 mg (4 mg Intravenous Given 02/20/16 0453)  morphine 2 MG/ML injection 4 mg (4 mg Intravenous Given 02/20/16 0639)     NEW OUTPATIENT MEDICATIONS STARTED DURING THIS VISIT:  Discharge Medication List as of 02/20/2016  6:59 AM    START taking these medications   Details  oxyCODONE-acetaminophen (ROXICET) 5-325 MG tablet Take 1 tablet by mouth every 4 (four) hours as needed for severe pain., Starting Fri 02/20/2016, Print        Discharge Medication List as of 02/20/2016  6:59 AM      Discharge Medication List as of 02/20/2016  6:59 AM       Note:  This document was prepared using Dragon voice recognition software and may include unintentional dictation errors.    Darci Current, MD 02/27/16 [id number]

## 2016-02-25 LAB — CULTURE, BLOOD (ROUTINE X 2)
Culture: NO GROWTH
Culture: NO GROWTH

## 2016-03-30 NOTE — Progress Notes (Signed)
Patient's Name: [name] [name]  MRN: [id number]  Referring Provider: [facility]  DOB: 1960-05-20  PCP: Gabriel Rung  DOS: 03/31/2016  Note by: Sydnee Levans. Laban Emperor, MD  Service setting: Ambulatory outpatient  Specialty: Interventional Pain Management  Location: [facility] (AMB) Pain Management Facility    Patient type: Established   Primary Reason(s) for Visit: Encounter for evaluation before starting new chronic pain management plan of care (Level of risk: moderate) CC: Hand Pain; Foot Pain; and Neck Pain (shoulders-)  HPI  [name] [name] is a 55 y.o. year old, female patient, who comes today for a follow-up evaluation to review the test results and decide on a treatment plan. She has History of laparoscopic adjustable gastric banding, 03/20/2007.  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; Shortness of breath; Obstruction of urinary tract; FOM (frequency of micturition); Urge incontinence of urine; Chronic wrist pain (Location of Secondary source of pain) (Bilateral) (L>R); Adhesive capsulitis; Borderline personality disorder; Chronic diastolic CHF (congestive heart failure), NYHA class 3 (HCC); GERD (gastroesophageal reflux disease); Hypokalemia; Hypomagnesemia; Mixed anxiety depressive disorder; QT prolongation; Type 2 diabetes mellitus with hyperglycemia (HCC); Vitamin D deficiency; 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[employer]); Chronic knee pain (Bilateral) (R>L); Long term current use of opiate analgesic; Long term prescription opiate use; Opiate use (60 MME/Day); Neurogenic pain; Neuropathic pain; Diabetic peripheral neuropathy  (HCC); Encounter for therapeutic drug level monitoring; Encounter for pain management planning; Morbid obesity with BMI of 40.0-44.9, adult (HCC); Osteoarthritis, multiple sites; Chronic foot pain (Location of Primary Source of Pain) (Bilateral) (L>R); Chronic elbow pain (Location of Tertiary source of pain) (Bilateral) (L>R); Chronic shoulder pain (Bilateral) (L>R); Chronic neck pain (Bilateral) (R>L); Chronic upper back pain; Chronic hand pain (Bilateral) (L>R); Rheumatoid arthritis involving multiple joints (HCC); Chronic pain syndrome; Insomnia secondary to chronic pain; and Hypothyroidism on her problem list. Her primarily concern today is the Hand Pain; Foot Pain; and Neck Pain (shoulders-)  Pain Assessment: Self-Reported Pain Score: 6 /10 Clinically the patient looks like a 2/10 Reported level is inconsistent with clinical observations. Information on the proper use of the pain score provided to the patient today. Pain Type: Chronic pain Pain Location: Neck Pain Orientation: Lower Pain Descriptors / Indicators: Aching, Constant, Burning, Grimacing, Sharp (feet burn cannot where shoes flip flops on) Pain Frequency: Constant  [name] [name] comes in today for a follow-up visit after her initial evaluation on 12/24/2015. Today we went over the results of her tests. These were explained in "Layman's terms". During today's appointment we went over my diagnostic impression, as well as the proposed treatment plan.  In considering the treatment plan options, [name] [name] was reminded that I no longer take patients for medication management only. I asked her to let me know if she had no intention of taking advantage of the interventional therapies, so that we could make arrangements to provide this space to someone interested. I also made it clear that undergoing interventional therapies for the purpose of getting pain medications is very inappropriate on the part of a patient, and it will not be tolerated in this  practice. This type of behavior would suggest true addiction and therefore it requires referral to an addiction specialist.   Further details on both, my assessment(s), as well as the proposed treatment plan, please see  below. Controlled Substance Pharmacotherapy Assessment REMS (Risk Evaluation and Mitigation Strategy)  Analgesic: Oxycodone/APAP 5/325 one tablet by mouth every 8 hours (15 mg/day of oxycodone) MME/day: 22.5 mg/day Pill Count: None expected due to no prior prescriptions written by our practice. Pharmacokinetics: Liberation and absorption (onset of action): WNL Distribution (time to peak effect): WNL Metabolism and excretion (duration of action): WNL         Pharmacodynamics: Desired effects: Analgesia: [name] [name] reports >50% benefit. Functional ability: Patient reports that medication allows her to accomplish basic ADLs Clinically meaningful improvement in function (CMIF): Sustained CMIF goals met Perceived effectiveness: Described as relatively effective, allowing for increase in activities of daily living (ADL) Undesirable effects: Side-effects or Adverse reactions: None reported Monitoring: [place] PMP: Online review of the past 77-monthperiod previously conducted. Not applicable at this point since we have not taken over the patient's medication management yet. List of all UDS test(s) done:  Lab Results  Component Value Date   TOXASSSELUR FINAL 03/19/2015   SUMMARY FINAL 12/24/2015   Last UDS on record: ToxAssure Select 13  Date Value Ref Range Status  03/19/2015 FINAL  Final    Comment:    ==================================================================== TOXASSURE SELECT 13 (MW) ==================================================================== Test                             Result       Flag       Units Drug Present and Declared for Prescription Verification   Oxycodone                      455          EXPECTED   ng/mg creat   Noroxycodone                    2736         EXPECTED   ng/mg creat    Sources of oxycodone include scheduled prescription medications.    Noroxycodone is an expected metabolite of oxycodone. Drug Present not Declared for Prescription Verification   Alcohol, Ethyl                 >0.400       UNEXPECTED g/dL    Sources of ethyl alcohol include alcoholic beverages or as a    fermentation product of glucose; glucose is present in this    specimen.  The high concentration of ethyl alcohol and the    presence of glucose supports fermentation as the source of ethyl    alcohol in this specimen. ==================================================================== Test                      Result    Flag   Units      Ref Range   Creatinine              44               mg/dL      >=20 ==================================================================== Declared Medications:  The flagging and interpretation on this report are based on the  following declared medications.  Unexpected results may arise from  inaccuracies in the declared medications.  **Note: The testing scope of this panel includes these medications:  Oxycodone (Percocet)  **Note: The testing scope of this panel does not include following  reported medications:  Acetaminophen (Percocet)  Gabapentin ==================================================================== For clinical consultation, please call [phone] ====================================================================  Summary  Date Value Ref Range Status  12/24/2015 FINAL  Final    Comment:    ==================================================================== TOXASSURE COMP DRUG ANALYSIS,UR ==================================================================== Test                             Result       Flag       Units Drug Present and Declared for Prescription Verification   Pregabalin                     PRESENT      EXPECTED   Fluoxetine                     PRESENT       EXPECTED   Norfluoxetine                  PRESENT      EXPECTED    Norfluoxetine is an expected metabolite of fluoxetine.   Promethazine                   PRESENT      EXPECTED Drug Present not Declared for Prescription Verification   Tramadol                       PRESENT      UNEXPECTED   O-Desmethyltramadol            PRESENT      UNEXPECTED   N-Desmethyltramadol            PRESENT      UNEXPECTED    Source of tramadol is a prescription medication.    O-desmethyltramadol and N-desmethyltramadol are expected    metabolites of tramadol. Drug Absent but Declared for Prescription Verification   Trazodone                      Not Detected UNEXPECTED   Aripiprazole                   Not Detected UNEXPECTED   Hydroxyzine                    Not Detected UNEXPECTED ==================================================================== Test                      Result    Flag   Units      Ref Range   Creatinine              111              mg/dL      >=20 ==================================================================== Declared Medications:  The flagging and interpretation on this report are based on the  following declared medications.  Unexpected results may arise from  inaccuracies in the declared medications.  **Note: The testing scope of this panel includes these medications:  Aripiprazole (Abilify)  Fluoxetine (Prozac)  Hydroxyzine (Atarax)  Hydroxyzine (Vistaril)  Pregabalin (Lyrica)  Promethazine (Phenergan)  Trazodone (Desyrel)  **Note: The testing scope of this panel does not include following  reported medications:  Abatacept  Atorvastatin (Lipitor)  Buspirone (BuSpar)  Calcium Carbonate  Clotrimazole (Lotrisone)  Famotidine (Pepcid)  Folic acid (Folvite)  Hydroxychloroquine (Plaquenil)  Leflunomide (Arava)  Levothyroxine  Lisinopril  Lovastatin (Mevacor)  Meloxicam (Mobic)  Metformin (Glucophage)  Methotrexate  Potassium (Klor-Con)  Prednisone (Deltasone)   Tamsulosin (Flomax)  Vitamin D2 (Drisdol)  Vitamin D2 (Ergocalciferol) ==================================================================== For clinical consultation, please call [phone]. ====================================================================    UDS interpretation: No unexpected findings.          Medication Assessment Form: Patient introduced to form today Treatment compliance: Treatment may start today if patient agrees with proposed plan. Evaluation of compliance is not applicable at this point Risk Assessment Profile: Aberrant behavior: See initial evaluations. None observed or detected today Comorbid factors increasing risk of overdose: See initial evaluation. No additional risks detected today Risk Mitigation Strategies:  Patient opioid safety counseling: Completed today. Counseling provided to patient as per "Patient Counseling Document". Document signed by patient, attesting to counseling and understanding Patient-Prescriber Agreement (PPA): Obtained today  Controlled substance notification to other providers: Written and sent today  Pharmacologic Plan: Today we may be taking over the patient's pharmacological regimen. See below  Laboratory Chemistry  Inflammation Markers Lab Results  Component Value Date   ESRSEDRATE 46 (H) 02/20/2016   CRP 0.6 12/24/2015   Renal Function Lab Results  Component Value Date   BUN 11 02/20/2016   CREATININE 0.49 02/20/2016   GFRAA >60 02/20/2016   GFRNONAA >60 02/20/2016   Hepatic Function Lab Results  Component Value Date   AST 19 02/20/2016   ALT 16 02/20/2016   ALBUMIN 3.7 02/20/2016   Electrolytes Lab Results  Component Value Date   NA 136 02/20/2016   K 3.8 02/20/2016   CL 102 02/20/2016   CALCIUM 8.8 (L) 02/20/2016   MG 1.8 12/24/2015   Pain Modulating Vitamins Lab Results  Component Value Date   25OHVITD1 39 12/24/2015   25OHVITD2 27 12/24/2015   25OHVITD3 12 12/24/2015   VITAMINB12 287  12/24/2015   Coagulation Parameters Lab Results  Component Value Date   PLT 235 02/20/2016   Cardiovascular Lab Results  Component Value Date   BNP 25.0 12/24/2015   HGB 12.8 02/20/2016   HCT 37.9 02/20/2016   Note: Lab results reviewed.  Recent Diagnostic Imaging Review  Dg Hip Unilat With Pelvis 2-3 Views Right Result Date: 02/20/2016 CLINICAL DATA:  55 y/o F; right hip pain started yesterday and has continued to worsen. EXAM: DG HIP (WITH OR WITHOUT PELVIS) 2-3V RIGHT COMPARISON:  None. FINDINGS: No acute fracture or dislocation is identified. Minimal osteoarthrosis of the hip joints bilaterally with productive changes in superolateral acetabula. Right femur greater trochanter small enthesophyte. Sacral stimulator device projecting over left hemipelvis with electrode along the left sacral ala. Pelvic phleboliths. IMPRESSION: No acute fracture or dislocation identified. Minimal bilateral hip osteoarthrosis. Electronically Signed   By: Kristine Garbe M.D.   On: 02/20/2016 05:56   Cervical Imaging: Cervical DG complete:  Results for orders placed during the hospital encounter of 12/24/15  DG Cervical Spine Complete   Narrative CLINICAL DATA:  Pt has had chronic neck, bilateral shoulders, bilateral elbows, and bilateral wrist pain since 1994; pt diagnosed with RA in 2004;  No surgeries, no injuries;  EXAM: CERVICAL SPINE - COMPLETE 4+ VIEW  COMPARISON:  None.  FINDINGS: No fracture.  No spondylolisthesis.  No bone lesion.  No widening of the atlantoaxial interval.  Minor loss disc height at C5-C6 and C6-C7. Neural foramina are suboptimally imaged on the right due to over obliquity. Probable mild narrowing of the right C4-C5 neural foramen. Mild narrowing of the left C3-C4 neural foramina from uncovertebral spurring. No other stenosis.  Soft tissues are unremarkable.  IMPRESSION: 1. No fracture or acute finding. 2. Mild degenerative  changes.   Electronically Signed  By: Lajean Manes M.D.   On: 12/24/2015 11:18    Shoulder Imaging: Shoulder-R DG:  Results for orders placed during the hospital encounter of 12/24/15  DG Shoulder Right   Narrative CLINICAL DATA:  Pt has had chronic neck, bilateral shoulders, bilateral elbows, and bilateral wrist pain since 1994; pt diagnosed with RA in 2004;  No surgeries, no injuries;  EXAM: RIGHT SHOULDER - 2+ VIEW  COMPARISON:  None.  FINDINGS: There is no evidence of fracture or dislocation. There is no evidence of arthropathy or other focal bone abnormality. Soft tissues are unremarkable.  IMPRESSION: Negative.   Electronically Signed   By: Lajean Manes M.D.   On: 12/24/2015 11:23    Shoulder-L DG:  Results for orders placed during the hospital encounter of 12/24/15  DG Shoulder Left   Narrative CLINICAL DATA:  Pt has had chronic neck, bilateral shoulders, bilateral elbows, and bilateral wrist pain since 1994; pt diagnosed with RA in 2004;  No surgeries, no injuries;  EXAM: LEFT SHOULDER - 2+ VIEW  COMPARISON:  None.  FINDINGS: No fracture.  No bone lesion.  Glenohumeral joint is normally spaced and aligned without arthropathic change. There are minor degenerative changes at the [facility] joint. No AC joint erosions.  Soft tissues are unremarkable.  IMPRESSION: 1. No fracture or acute findings. No evidence of an inflammatory arthropathy. 2. Minor AC joint osteoarthritis.   Electronically Signed   By: Lajean Manes M.D.   On: 12/24/2015 11:22    Hip Imaging: Hip-R DG 2-3 views:  Results for orders placed during the hospital encounter of 02/20/16  DG HIP UNILAT WITH PELVIS 2-3 VIEWS RIGHT   Narrative CLINICAL DATA:  55 y/o F; right hip pain started yesterday and has continued to worsen.  EXAM: DG HIP (WITH OR WITHOUT PELVIS) 2-3V RIGHT  COMPARISON:  None.  FINDINGS: No acute fracture or dislocation is identified.  Minimal osteoarthrosis of the hip joints bilaterally with productive changes in superolateral acetabula. Right femur greater trochanter small enthesophyte. Sacral stimulator device projecting over left hemipelvis with electrode along the left sacral ala. Pelvic phleboliths.  IMPRESSION: No acute fracture or dislocation identified. Minimal bilateral hip osteoarthrosis.   Electronically Signed   By: Kristine Garbe M.D.   On: 02/20/2016 05:56    Note: Results of ordered imaging test(s) reviewed and explained to patient in Layman's terms. Copy of results provided to patient  Meds  The patient has a current medication list which includes the following prescription(s): abatacept, abatacept, atorvastatin, buspirone, calcium carb-ergocalciferol, cholecalciferol, clotrimazole-betamethasone, famotidine, fluoxetine, folic acid, hydroxychloroquine, hydroxyzine, insulin aspart, klor-con m20, levothyroxine, lisinopril, lovastatin, magnesium oxide, melatonin, meloxicam, metformin, methotrexate, oxycodone, prednisone, promethazine, tamsulosin, vitamin d (ergocalciferol), zolpidem, and pregabalin.  Current Outpatient Prescriptions on File Prior to Visit  Medication Sig  . Abatacept (ORENCIA [place]) Inject into the vein every 30 (thirty) days. Every 2 weeks  . atorvastatin (LIPITOR) 80 MG tablet Take 80 mg by mouth daily.  . busPIRone (BUSPAR) 10 MG tablet Take 1 tablet (10 mg total) by mouth 2 (two) times daily.  . Calcium Carb-Ergocalciferol 250-125 MG-UNIT TABS Take by mouth.  . Cholecalciferol (VITAMIN D-1000 MAX ST) 1000 UNITS tablet Take by mouth.  . clotrimazole-betamethasone (LOTRISONE) cream Apply 1 application topically 2 (two) times daily. For yeast infection under stomach  . famotidine (PEPCID) 20 MG tablet Take 20 mg by mouth 2 (two) times daily.   Marland Kitchen FLUoxetine (PROZAC) 20 MG capsule Take 1 capsule (20 mg total) by mouth daily.  Marland Kitchen  folic acid (FOLVITE) 1 MG tablet Take two tablets to  make '2mg'$ .  . hydroxychloroquine (PLAQUENIL) 200 MG tablet Take 200 mg by mouth 2 (two) times daily.  . hydrOXYzine (ATARAX/VISTARIL) 50 MG tablet Take 50 mg by mouth Twice daily.   . Insulin Aspart (NOVOLOG [place]) Inject 10 Units into the skin 2 (two) times daily. Injection twice daily  . KLOR-CON M20 20 MEQ tablet TAKE ONE TABLET BY MOUTH TWICE DAILY (Patient taking differently: take 2 tabs in the a.m. , 1 tab in the evening)  . levothyroxine (SYNTHROID, LEVOTHROID) 88 MCG tablet Take 88 mcg by mouth daily before breakfast.  . lisinopril (PRINIVIL,ZESTRIL) 2.5 MG tablet Take 2.5 mg by mouth daily.  Marland Kitchen lovastatin (MEVACOR) 40 MG tablet Take 40 mg by mouth.  . meloxicam (MOBIC) 7.5 MG tablet Take 1 tablet (7.5 mg total) by mouth 2 (two) times daily after a meal.  . metFORMIN (GLUCOPHAGE) 500 MG tablet Take 1,000 mg by mouth daily.   . methotrexate 50 MG/2ML injection Inject 1 mL into the muscle once a week.  . predniSONE (DELTASONE) 10 MG tablet Take 20-25 mg by mouth daily as needed (Pain scale  Used to determine dosage). Reported on 08/29/2015  . promethazine (PHENERGAN) 25 MG tablet Take 25 mg by mouth every 6 (six) hours as needed. For nausea  . tamsulosin (FLOMAX) 0.4 MG CAPS Take 0.4 mg by mouth daily.  . Vitamin D, Ergocalciferol, (DRISDOL) 50000 units CAPS capsule Take by mouth.  . zolpidem (AMBIEN) 5 MG tablet Take 1 tablet (5 mg total) by mouth at bedtime as needed for sleep.   No current facility-administered medications on file prior to visit.    ROS  Constitutional: Denies any fever or chills Gastrointestinal: No reported hemesis, hematochezia, vomiting, or acute GI distress Musculoskeletal: Denies any acute onset joint swelling, redness, loss of ROM, or weakness Neurological: No reported episodes of acute onset apraxia, aphasia, dysarthria, agnosia, amnesia, paralysis, loss of coordination, or loss of consciousness  Allergies  [name] [name] is allergic to codeine; propoxyphene;  propoxyphene; sulfa antibiotics; hydrocodone; and sulfasalazine.  PFSH  Drug: [name] [name]  reports that she does not use drugs. Alcohol:  reports that she does not drink alcohol. Tobacco:  reports that she quit smoking about 16 years ago. Her smoking use included Cigarettes. She has a 54.00 pack-year smoking history. She has never used smokeless tobacco. Medical:  has a past medical history of Abdominal wall hernia (01/29/2013); Anxiety; Arthritis; C. difficile colitis; Chronic diastolic heart failure ([place]); Depression; Diabetes mellitus; Diarrhea (10/22/2015); Diastolic CHF ([place]); Esophagitis; Fluid retention; GERD (gastroesophageal reflux disease); Hiatal hernia; Hypertension; Hypokalemia due to loss of potassium (10/21/2015); Hypothyroidism; IBS (irritable bowel syndrome); Moderate episode of recurrent major depressive disorder ([place]) (06/03/2004); Morbid obesity ([place]); Nausea & vomiting (08/04/2011); Neurogenic bladder; Neuropathy ([place]); Obesity; Panic attacks; Rheumatoid arthritis ([place]); and Sleep apnea. Family: family history includes Alcohol abuse in her father and sister; Anxiety disorder in her father, sister, and sister; Bipolar disorder in her father and sister; Depression in her father, sister, and sister; Drug abuse in her sister; Heart attack in her brother; Heart attack (age of onset: 32) in her brother; Heart disease in her brother; Heart failure in her father.  Past Surgical History:  Procedure Laterality Date  . ABDOMINAL HYSTERECTOMY    . CHOLECYSTECTOMY    . EYE SURGERY     bilateral cataract extraction with IOL  . HERNIA REPAIR     ventral hernia with strangulation  . LAPAROSCOPIC  GASTRIC BANDING  03/20/07  . TONSILLECTOMY    . TUBAL LIGATION     Constitutional Exam  General appearance: Well nourished, well developed, and well hydrated. In no apparent acute distress Vitals:   03/31/16 1343  BP: 139/84  Pulse: 73  Resp: 16  Temp: 98.4 F (36.9 C)  TempSrc: Oral  SpO2: 99%   Weight: 290 lb (131.5 kg)  Height: '5\' 6"'$  (1.676 m)   BMI Assessment: Estimated body mass index is 46.81 kg/m as calculated from the following:   Height as of this encounter: '5\' 6"'$  (1.676 m).   Weight as of this encounter: 290 lb (131.5 kg).  BMI interpretation table: BMI level Category Range association with higher incidence of chronic pain  <18 kg/m2 Underweight   18.5-24.9 kg/m2 Ideal body weight   25-29.9 kg/m2 Overweight Increased incidence by 20%  30-34.9 kg/m2 Obese (Class I) Increased incidence by 68%  35-39.9 kg/m2 Severe obesity (Class II) Increased incidence by 136%  >40 kg/m2 Extreme obesity (Class III) Increased incidence by 254%   BMI Readings from Last 4 Encounters:  03/31/16 46.81 kg/m  02/20/16 44.06 kg/m  12/24/15 41.80 kg/m  10/17/15 42.77 kg/m   Wt Readings from Last 4 Encounters:  03/31/16 290 lb (131.5 kg)  02/20/16 273 lb (123.8 kg)  12/24/15 259 lb (117.5 kg)  10/17/15 265 lb (120.2 kg)  Psych/Mental status: Alert, oriented x 3 (person, place, & time) Eyes: PERLA Respiratory: No evidence of acute respiratory distress  Cervical Spine Exam  Inspection: No masses, redness, or swelling Alignment: Symmetrical Functional ROM: Unrestricted ROM Stability: No instability detected Muscle strength & Tone: Functionally intact Sensory: Unimpaired Palpation: Non-contributory  Upper Extremity (UE) Exam    Side: Right upper extremity  Side: Left upper extremity  Inspection: No masses, redness, swelling, or asymmetry  Inspection: No masses, redness, swelling, or asymmetry  Functional ROM: Unrestricted ROM          Functional ROM: Unrestricted ROM          Muscle strength & Tone: Functionally intact  Muscle strength & Tone: Functionally intact  Sensory: Unimpaired  Sensory: Unimpaired  Palpation: Non-contributory  Palpation: Non-contributory   Thoracic Spine Exam  Inspection: No masses, redness, or swelling Alignment: Symmetrical Functional ROM:  Unrestricted ROM Stability: No instability detected Sensory: Unimpaired Muscle strength & Tone: Functionally intact Palpation: Non-contributory  Lumbar Spine Exam  Inspection: No masses, redness, or swelling Alignment: Symmetrical Functional ROM: Unrestricted ROM Stability: No instability detected Muscle strength & Tone: Functionally intact Sensory: Unimpaired Palpation: Non-contributory Provocative Tests: Lumbar Hyperextension and rotation test: evaluation deferred today       Patrick's Maneuver: evaluation deferred today              Gait & Posture Assessment  Ambulation: Unassisted Gait: Relatively normal for age and body habitus Posture: WNL   Lower Extremity Exam    Side: Right lower extremity  Side: Left lower extremity  Inspection: No masses, redness, swelling, or asymmetry  Inspection: No masses, redness, swelling, or asymmetry  Functional ROM: Unrestricted ROM          Functional ROM: Unrestricted ROM          Muscle strength & Tone: Functionally intact  Muscle strength & Tone: Functionally intact  Sensory: Unimpaired  Sensory: Unimpaired  Palpation: Non-contributory  Palpation: Non-contributory   Assessment & Plan  Primary Diagnosis & Pertinent Problem List: The primary encounter diagnosis was Chronic pain syndrome. Diagnoses of Neurogenic pain, Hypomagnesemia, Insomnia secondary to chronic pain,  Long term current use of opiate analgesic, Opiate use (60 MME/Day), Obstructive apnea, Long term prescription opiate use, Chronic foot pain (Location of Primary Source of Pain) (Bilateral) (L>R), Chronic wrist pain (Location of Secondary source of pain) (Bilateral) (L>R), Chronic elbow pain (Location of Tertiary source of pain) (Bilateral) (L>R), and Rheumatoid arthritis involving multiple joints (HCC) were also pertinent to this visit.  Visit Diagnosis: 1. Chronic pain syndrome   2. Neurogenic pain   3. Hypomagnesemia   4. Insomnia secondary to chronic pain   5. Long term  current use of opiate analgesic   6. Opiate use (60 MME/Day)   7. Obstructive apnea   8. Long term prescription opiate use   9. Chronic foot pain (Location of Primary Source of Pain) (Bilateral) (L>R)   10. Chronic wrist pain (Location of Secondary source of pain) (Bilateral) (L>R)   11. Chronic elbow pain (Location of Tertiary source of pain) (Bilateral) (L>R)   12. Rheumatoid arthritis involving multiple joints (HCC)    Problems updated and reviewed during this visit: Problem  Chronic foot pain (Location of Primary Source of Pain) (Bilateral) (L>R)  Chronic elbow pain (Location of Tertiary source of pain) (Bilateral) (L>R)  Chronic wrist pain (Location of Secondary source of pain) (Bilateral) (L>R)  Gerd (Gastroesophageal Reflux Disease)  Hypokalemia   Overview:  Associated with 3 weeks of diarrhea  And QT prolongation.   Copd (Chronic Obstructive Pulmonary Disease) (Hcc)  Borderline Personality Disorder  Hypothyroidism   Problem-specific Plan(s): No problem-specific Assessment & Plan notes found for this encounter.  No new Assessment & Plan notes have been filed under this hospital service since the last note was generated. Service: Pain Management  Plan of Care  Pharmacotherapy (Medications Ordered): Meds ordered this encounter  Medications  . Magnesium Oxide 500 MG CAPS    Sig: Take 1 capsule (500 mg total) by mouth 2 (two) times daily at 8 am and 10 pm.    Dispense:  30 capsule    Refill:  0    Do not add to the "Automatic Refill" notification system.  Marland Kitchen oxyCODONE (OXY IR/ROXICODONE) 5 MG immediate release tablet    Sig: Take 1 tablet (5 mg total) by mouth every 6 (six) hours as needed for severe pain.    Dispense:  120 tablet    Refill:  0    Do not place this medication, or any other prescription from our practice, on "Automatic Refill". Patient may have prescription filled one day early if pharmacy is closed on scheduled refill date. Do not fill until: 03/31/16 To  last until: 04/30/16  . pregabalin (LYRICA) 150 MG capsule    Sig: Take 1 capsule (150 mg total) by mouth 2 (two) times daily.    Dispense:  60 capsule    Refill:  0    Do not add this medication to the electronic "Automatic Refill" notification system. Patient may have prescription filled one day early if pharmacy is closed on scheduled refill date.  . Melatonin 10 MG CAPS    Sig: Take 20 mg by mouth at bedtime as needed.    Dispense:  60 capsule    Refill:  0    Do not place this medication, or any other prescription from our practice, on "Automatic Refill". Patient may have prescription filled one day early if pharmacy is closed on scheduled refill date.   Lab-work, procedure(s), and/or referral(s): No orders of the defined types were placed in this encounter.   Pharmacotherapy: Opioid Analgesics:  We'll take over management today. See above orders Membrane stabilizer: We have discussed the possibility of optimizing this mode of therapy, if tolerated Muscle relaxant: We have discussed the possibility of a trial NSAID: We have discussed the possibility of a trial Other analgesic(s): To be determined at a later time   Interventional therapies: Planned, scheduled, and/or pending:    None at this time.    Considering:   IV lidocaine infusions.  Intra-articular injection with local anesthetic and steroids.  Diagnostic bilateral intra-articular shoulder joint injection under fluoroscopic guidance, with or without sedation.  Diagnostic bilateral intra-articular knee injections with local anesthetic and steroid, under fluoroscopic guidance, no sedation.  Diagnostic right-sided cervical epidural steroid injections under fluoroscopic guidance and IV sedation.  Diagnostic bilateral  Cervical facet block under fluoroscopic guidance and IV sedation.  Possible bilateral cervical facet radiofrequency ablation.    PRN Procedures:   To be determined at a later time   Provider-requested  follow-up: Return in about 1 month (around 05/01/2016) for Med-Mgmt.  Future Appointments Date Time Provider [place]  07/01/2016 1:00 PM Gonzella Lex, MD [facility] None    Primary Care Physician: [facility] Location: [facility] Note by: Kathlen Brunswick. Dossie Arbour, M.D, DABA, DABAPM, DABPM, DABIPP, FIPP Date: 04/01/16; Time: 4:49 PM  Pain Score Disclaimer: We use the NRS-11 scale. This is a self-reported, subjective measurement of pain severity with only modest accuracy. It is used primarily to identify changes within a particular patient. It must be understood that outpatient pain scales are significantly less accurate that those used for research, where they can be applied under ideal controlled circumstances with minimal exposure to variables. In reality, the score is likely to be a combination of pain intensity and pain affect, where pain affect describes the degree of emotional arousal or changes in action readiness caused by the sensory experience of pain. Factors such as social and work situation, setting, emotional state, anxiety levels, expectation, and prior pain experience may influence pain perception and show large inter-individual differences that may also be affected by time variables.  Patient instructions provided during this appointment: Patient Instructions  Pain Management Discharge Instructions  General Discharge Instructions :  If you need to reach your doctor call: Monday-Friday 8:00 am - 4:00 pm at [phone] or toll free [phone].  After clinic hours [phone] to have operator reach doctor.  Bring all of your medication bottles to all your appointments in the pain clinic.  To cancel or reschedule your appointment with Pain Management please remember to call 24 hours in advance to avoid a fee.  Refer to the educational materials which you have been given on: General Risks, I had my Procedure. Discharge  Instructions, Post Sedation.  Post Procedure Instructions:  The drugs you were given will stay in your system until tomorrow, so for the next 24 hours you should not drive, make any legal decisions or drink any alcoholic beverages.  You may eat anything you prefer, but it is better to start with liquids then soups and crackers, and gradually work up to solid foods.  Please notify your doctor immediately if you have any unusual bleeding, trouble breathing or pain that is not related to your normal pain.  Depending on the type of procedure that was done, some parts of your body may feel week and/or numb.  This usually clears up by tonight or the next day.  Walk with the use of an assistive device or accompanied by an adult  for the 24 hours.  You may use ice on the affected area for the first 24 hours.  Put ice in a Ziploc bag and cover with a towel and place against area 15 minutes on 15 minutes off.  You may switch to heat after 24 hours.

## 2016-03-31 ENCOUNTER — Encounter: Payer: Self-pay | Admitting: Pain Medicine

## 2016-03-31 ENCOUNTER — Ambulatory Visit: Payer: Medicaid Other | Attending: Pain Medicine | Admitting: Pain Medicine

## 2016-03-31 VITALS — BP 139/84 | HR 73 | Temp 98.4°F | Resp 16 | Ht 66.0 in | Wt 290.0 lb

## 2016-03-31 DIAGNOSIS — M25531 Pain in right wrist: Secondary | ICD-10-CM | POA: Insufficient documentation

## 2016-03-31 DIAGNOSIS — M25532 Pain in left wrist: Secondary | ICD-10-CM | POA: Insufficient documentation

## 2016-03-31 DIAGNOSIS — K219 Gastro-esophageal reflux disease without esophagitis: Secondary | ICD-10-CM | POA: Insufficient documentation

## 2016-03-31 DIAGNOSIS — M79673 Pain in unspecified foot: Secondary | ICD-10-CM

## 2016-03-31 DIAGNOSIS — J449 Chronic obstructive pulmonary disease, unspecified: Secondary | ICD-10-CM | POA: Diagnosis not present

## 2016-03-31 DIAGNOSIS — R350 Frequency of micturition: Secondary | ICD-10-CM | POA: Diagnosis not present

## 2016-03-31 DIAGNOSIS — Z6841 Body Mass Index (BMI) 40.0 and over, adult: Secondary | ICD-10-CM | POA: Insufficient documentation

## 2016-03-31 DIAGNOSIS — G4701 Insomnia due to medical condition: Secondary | ICD-10-CM | POA: Diagnosis not present

## 2016-03-31 DIAGNOSIS — E1140 Type 2 diabetes mellitus with diabetic neuropathy, unspecified: Secondary | ICD-10-CM | POA: Diagnosis not present

## 2016-03-31 DIAGNOSIS — F419 Anxiety disorder, unspecified: Secondary | ICD-10-CM | POA: Diagnosis not present

## 2016-03-31 DIAGNOSIS — I5032 Chronic diastolic (congestive) heart failure: Secondary | ICD-10-CM | POA: Insufficient documentation

## 2016-03-31 DIAGNOSIS — G4733 Obstructive sleep apnea (adult) (pediatric): Secondary | ICD-10-CM

## 2016-03-31 DIAGNOSIS — F1190 Opioid use, unspecified, uncomplicated: Secondary | ICD-10-CM

## 2016-03-31 DIAGNOSIS — E7801 Familial hypercholesterolemia: Secondary | ICD-10-CM | POA: Diagnosis not present

## 2016-03-31 DIAGNOSIS — M19012 Primary osteoarthritis, left shoulder: Secondary | ICD-10-CM | POA: Diagnosis not present

## 2016-03-31 DIAGNOSIS — F603 Borderline personality disorder: Secondary | ICD-10-CM | POA: Diagnosis not present

## 2016-03-31 DIAGNOSIS — N3946 Mixed incontinence: Secondary | ICD-10-CM | POA: Diagnosis not present

## 2016-03-31 DIAGNOSIS — M069 Rheumatoid arthritis, unspecified: Secondary | ICD-10-CM | POA: Diagnosis not present

## 2016-03-31 DIAGNOSIS — E876 Hypokalemia: Secondary | ICD-10-CM | POA: Diagnosis not present

## 2016-03-31 DIAGNOSIS — Z794 Long term (current) use of insulin: Secondary | ICD-10-CM | POA: Diagnosis not present

## 2016-03-31 DIAGNOSIS — I110 Hypertensive heart disease with heart failure: Secondary | ICD-10-CM | POA: Diagnosis not present

## 2016-03-31 DIAGNOSIS — E039 Hypothyroidism, unspecified: Secondary | ICD-10-CM | POA: Diagnosis not present

## 2016-03-31 DIAGNOSIS — G8929 Other chronic pain: Secondary | ICD-10-CM | POA: Diagnosis not present

## 2016-03-31 DIAGNOSIS — E1165 Type 2 diabetes mellitus with hyperglycemia: Secondary | ICD-10-CM | POA: Diagnosis not present

## 2016-03-31 DIAGNOSIS — E1142 Type 2 diabetes mellitus with diabetic polyneuropathy: Secondary | ICD-10-CM | POA: Diagnosis not present

## 2016-03-31 DIAGNOSIS — G894 Chronic pain syndrome: Secondary | ICD-10-CM | POA: Diagnosis not present

## 2016-03-31 DIAGNOSIS — M25529 Pain in unspecified elbow: Secondary | ICD-10-CM

## 2016-03-31 DIAGNOSIS — M792 Neuralgia and neuritis, unspecified: Secondary | ICD-10-CM

## 2016-03-31 DIAGNOSIS — M542 Cervicalgia: Secondary | ICD-10-CM | POA: Diagnosis present

## 2016-03-31 DIAGNOSIS — Z87891 Personal history of nicotine dependence: Secondary | ICD-10-CM | POA: Insufficient documentation

## 2016-03-31 DIAGNOSIS — M79641 Pain in right hand: Secondary | ICD-10-CM | POA: Insufficient documentation

## 2016-03-31 DIAGNOSIS — Z79891 Long term (current) use of opiate analgesic: Secondary | ICD-10-CM

## 2016-03-31 DIAGNOSIS — F319 Bipolar disorder, unspecified: Secondary | ICD-10-CM | POA: Insufficient documentation

## 2016-03-31 MED ORDER — MAGNESIUM OXIDE -MG SUPPLEMENT 500 MG PO CAPS: 1.0000 | ORAL_CAPSULE | Freq: Two times a day (BID) | ORAL | 0 refills | Status: DC

## 2016-03-31 MED ORDER — OXYCODONE HCL 5 MG PO TABS: 5.0000 mg | ORAL_TABLET | Freq: Four times a day (QID) | ORAL | 0 refills | Status: DC | PRN

## 2016-03-31 MED ORDER — PREGABALIN 150 MG PO CAPS: 150.0000 mg | ORAL_CAPSULE | Freq: Two times a day (BID) | ORAL | 0 refills | Status: DC

## 2016-03-31 MED ORDER — MELATONIN 10 MG PO CAPS: 20.0000 mg | ORAL_CAPSULE | Freq: Every evening | ORAL | 0 refills | Status: DC | PRN

## 2016-03-31 NOTE — Progress Notes (Signed)
Safety precautions to be maintained throughout the outpatient stay will include: orient to surroundings, keep bed in low position, maintain call bell within reach at all times, provide assistance with transfer out of bed and ambulation.  

## 2016-03-31 NOTE — Patient Instructions (Signed)

## 2016-04-21 ENCOUNTER — Encounter: Payer: Self-pay | Admitting: Pain Medicine

## 2016-04-21 ENCOUNTER — Ambulatory Visit: Payer: Medicaid Other | Attending: Pain Medicine | Admitting: Pain Medicine

## 2016-04-21 VITALS — BP 113/75 | HR 80 | Temp 98.6°F | Resp 16 | Ht 66.0 in | Wt 290.0 lb

## 2016-04-21 DIAGNOSIS — M069 Rheumatoid arthritis, unspecified: Secondary | ICD-10-CM | POA: Diagnosis not present

## 2016-04-21 DIAGNOSIS — R350 Frequency of micturition: Secondary | ICD-10-CM | POA: Insufficient documentation

## 2016-04-21 DIAGNOSIS — E1142 Type 2 diabetes mellitus with diabetic polyneuropathy: Secondary | ICD-10-CM | POA: Diagnosis present

## 2016-04-21 DIAGNOSIS — Z794 Long term (current) use of insulin: Secondary | ICD-10-CM | POA: Insufficient documentation

## 2016-04-21 DIAGNOSIS — J449 Chronic obstructive pulmonary disease, unspecified: Secondary | ICD-10-CM | POA: Insufficient documentation

## 2016-04-21 DIAGNOSIS — M25512 Pain in left shoulder: Secondary | ICD-10-CM | POA: Insufficient documentation

## 2016-04-21 DIAGNOSIS — M25532 Pain in left wrist: Secondary | ICD-10-CM | POA: Diagnosis not present

## 2016-04-21 DIAGNOSIS — I5032 Chronic diastolic (congestive) heart failure: Secondary | ICD-10-CM | POA: Diagnosis not present

## 2016-04-21 DIAGNOSIS — F419 Anxiety disorder, unspecified: Secondary | ICD-10-CM | POA: Insufficient documentation

## 2016-04-21 DIAGNOSIS — E876 Hypokalemia: Secondary | ICD-10-CM | POA: Diagnosis not present

## 2016-04-21 DIAGNOSIS — E1165 Type 2 diabetes mellitus with hyperglycemia: Secondary | ICD-10-CM | POA: Diagnosis not present

## 2016-04-21 DIAGNOSIS — E039 Hypothyroidism, unspecified: Secondary | ICD-10-CM | POA: Insufficient documentation

## 2016-04-21 DIAGNOSIS — M25531 Pain in right wrist: Secondary | ICD-10-CM

## 2016-04-21 DIAGNOSIS — E7801 Familial hypercholesterolemia: Secondary | ICD-10-CM | POA: Insufficient documentation

## 2016-04-21 DIAGNOSIS — Z79891 Long term (current) use of opiate analgesic: Secondary | ICD-10-CM | POA: Diagnosis not present

## 2016-04-21 DIAGNOSIS — M792 Neuralgia and neuritis, unspecified: Secondary | ICD-10-CM

## 2016-04-21 DIAGNOSIS — M79673 Pain in unspecified foot: Secondary | ICD-10-CM | POA: Diagnosis not present

## 2016-04-21 DIAGNOSIS — I110 Hypertensive heart disease with heart failure: Secondary | ICD-10-CM | POA: Diagnosis not present

## 2016-04-21 DIAGNOSIS — M160 Bilateral primary osteoarthritis of hip: Secondary | ICD-10-CM | POA: Insufficient documentation

## 2016-04-21 DIAGNOSIS — M25522 Pain in left elbow: Secondary | ICD-10-CM | POA: Insufficient documentation

## 2016-04-21 DIAGNOSIS — M25529 Pain in unspecified elbow: Secondary | ICD-10-CM

## 2016-04-21 DIAGNOSIS — G894 Chronic pain syndrome: Secondary | ICD-10-CM | POA: Diagnosis not present

## 2016-04-21 DIAGNOSIS — M79641 Pain in right hand: Secondary | ICD-10-CM | POA: Insufficient documentation

## 2016-04-21 DIAGNOSIS — F319 Bipolar disorder, unspecified: Secondary | ICD-10-CM | POA: Diagnosis not present

## 2016-04-21 DIAGNOSIS — M25511 Pain in right shoulder: Secondary | ICD-10-CM | POA: Insufficient documentation

## 2016-04-21 DIAGNOSIS — F603 Borderline personality disorder: Secondary | ICD-10-CM | POA: Insufficient documentation

## 2016-04-21 DIAGNOSIS — M542 Cervicalgia: Secondary | ICD-10-CM | POA: Insufficient documentation

## 2016-04-21 DIAGNOSIS — N3946 Mixed incontinence: Secondary | ICD-10-CM | POA: Diagnosis not present

## 2016-04-21 DIAGNOSIS — Z6841 Body Mass Index (BMI) 40.0 and over, adult: Secondary | ICD-10-CM | POA: Insufficient documentation

## 2016-04-21 DIAGNOSIS — G8929 Other chronic pain: Secondary | ICD-10-CM

## 2016-04-21 DIAGNOSIS — F1190 Opioid use, unspecified, uncomplicated: Secondary | ICD-10-CM | POA: Diagnosis not present

## 2016-04-21 MED ORDER — PREGABALIN 150 MG PO CAPS: 150.0000 mg | ORAL_CAPSULE | Freq: Three times a day (TID) | ORAL | 0 refills | Status: DC

## 2016-04-21 MED ORDER — OXYCODONE HCL 5 MG PO TABS: 5.0000 mg | ORAL_TABLET | Freq: Four times a day (QID) | ORAL | 0 refills | Status: DC | PRN

## 2016-04-21 NOTE — Progress Notes (Signed)
Patient's Name: [name] [name]  MRN: [id number]  Referring Provider: [place] [place]: 04/18/1961  PCP: Morrell Riddle  DOS: 04/21/2016  Note by: Kathlen Brunswick. Dossie Arbour, MD  Service setting: Ambulatory outpatient  Specialty: Interventional Pain Management  Location: [facility] (AMB) Pain Management Facility    Patient type: Established   Primary Reason(s) for Visit: Encounter for prescription drug management (Level of risk: moderate) CC: Pain (RA pain "all over")  HPI  [name] [name] is a 56 y.o. year old, female patient, who comes today for a medication management evaluation. She has History of laparoscopic adjustable gastric banding, 03/20/2007.  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; Shortness of breath; Obstruction of urinary tract; FOM (frequency of micturition); Urge incontinence of urine; Chronic wrist pain (Location of Secondary source of pain) (Bilateral) (L>R); Adhesive capsulitis; Borderline personality disorder; Chronic diastolic CHF (congestive heart failure), NYHA class 3 (HCC); GERD (gastroesophageal reflux disease); Hypokalemia; Hypomagnesemia; Mixed anxiety depressive disorder; QT prolongation; Type 2 diabetes mellitus with hyperglycemia ([place]); Vitamin D deficiency; 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[employer]); Chronic knee pain (Bilateral) (R>L); Long term current use of opiate analgesic; Long term prescription opiate use; Opiate use (30 MME/Day); Neurogenic pain; Neuropathic pain; Diabetic peripheral neuropathy ([place]); Encounter for therapeutic drug level monitoring; Encounter for pain management planning; Morbid obesity with  BMI of 40.0-44.9, adult ([place]); Osteoarthritis, multiple sites; Chronic foot pain (Location of Primary Source of Pain) (Bilateral) (L>R); Chronic elbow pain (Location of Tertiary source of pain) (Bilateral) (L>R); Chronic shoulder pain (Bilateral) (L>R); Chronic neck pain (Bilateral) (R>L); Chronic upper back pain; Chronic hand pain (Bilateral) (L>R); Rheumatoid arthritis involving multiple joints ([place]); Chronic pain syndrome; Insomnia secondary to chronic pain; and Hypothyroidism on her problem list. Her primarily concern today is the Pain (RA pain "all over")  Pain Assessment: Self-Reported Pain Score: 4 /10 Clinically the patient looks like a 3/10 Reported level is inconsistent with clinical observations. Information on the proper use of the pain score provided to the patient today. Pain Type: Chronic pain Pain Descriptors / Indicators: Stabbing, Sharp Pain Frequency: Constant  [name] [name] was last seen on 03/31/2016 for medication management. During today's appointment we reviewed [name] [name]'s chronic pain status, as well as her outpatient medication regimen.  The patient  reports that she does not use drugs. Her body mass index is 46.81 kg/m.  Further details on both, my assessment(s), as well as the proposed treatment plan, please see below.  Controlled Substance Pharmacotherapy Assessment REMS (Risk Evaluation and Mitigation Strategy)  Analgesic: Oxycodone IR 5 mg one tablet by mouth every 6 hours (20 mg/day of oxycodone) MME/day: 30 mg/day  Landis Martins, RN  04/21/2016  2:00 PM  Sign at close encounter Nursing Pain Medication Assessment:  Safety precautions to be maintained throughout the outpatient stay will include: orient to surroundings, keep bed in low position, maintain call bell within reach at all times, provide assistance with transfer out of bed and ambulation.  Medication Inspection Compliance: [name] [name] did not comply with our request to bring her pills to be counted. She was  reminded that bringing the medication bottles, even when empty, is a requirement. Pill Count: No pills available to be counted today. Bottle Appearance: No container available. Did not bring bottle(s) to appointment. Medication: None brought in. Filled  Date: N/A Pharmacokinetics: Liberation and absorption (onset of action): WNL Distribution (time to peak effect): WNL Metabolism and excretion (duration of action): WNL         Pharmacodynamics: Desired effects: Analgesia: [name] [name] reports >50% benefit. Functional ability: Patient reports that medication allows her to accomplish basic ADLs Clinically meaningful improvement in function (CMIF): Sustained CMIF goals met Perceived effectiveness: Described as relatively effective, allowing for increase in activities of daily living (ADL) Undesirable effects: Side-effects or Adverse reactions: None reported Monitoring:  PMP: Online review of the past 58-monthperiod conducted. Compliant with practice rules and regulations List of all UDS test(s) done:  Lab Results  Component Value Date   TOXASSSELUR FINAL 03/19/2015   SUMMARY FINAL 12/24/2015   Last UDS on record: ToxAssure Select 13  Date Value Ref Range Status  03/19/2015 FINAL  Final    Comment:    ==================================================================== TOXASSURE SELECT 13 (MW) ==================================================================== Test                             Result       Flag       Units Drug Present and Declared for Prescription Verification   Oxycodone                      455          EXPECTED   ng/mg creat   Noroxycodone                   2736         EXPECTED   ng/mg creat    Sources of oxycodone include scheduled prescription medications.    Noroxycodone is an expected metabolite of oxycodone. Drug Present not Declared for Prescription Verification   Alcohol, Ethyl                 >0.400       UNEXPECTED g/dL    Sources of ethyl alcohol  include alcoholic beverages or as a    fermentation product of glucose; glucose is present in this    specimen.  The high concentration of ethyl alcohol and the    presence of glucose supports fermentation as the source of ethyl    alcohol in this specimen. ==================================================================== Test                      Result    Flag   Units      Ref Range   Creatinine              44               mg/dL      >=20 ==================================================================== Declared Medications:  The flagging and interpretation on this report are based on the  following declared medications.  Unexpected results may arise from  inaccuracies in the declared medications.  **Note: The testing scope of this panel includes these medications:  Oxycodone (Percocet)  **Note: The testing scope of this panel does not include following  reported medications:  Acetaminophen (Percocet)  Gabapentin ==================================================================== For clinical consultation, please call ([phone] ====================================================================    UDS interpretation: Compliant          Medication Assessment Form: Reviewed. Patient indicates being compliant with therapy Treatment compliance: Compliant Risk Assessment Profile: Aberrant behavior: See prior evaluations. None observed or detected today Comorbid factors increasing risk of overdose: See prior notes. No additional  risks detected today Risk of substance use disorder (SUD): Low Opioid Risk Tool (ORT) Total Score:    Interpretation Table:  Score <3 = Low Risk for SUD  Score between 4-7 = Moderate Risk for SUD  Score >8 = High Risk for Opioid Abuse   Risk Mitigation Strategies:  Patient Counseling: Covered Patient-Prescriber Agreement (PPA): Present and active  Notification to other healthcare providers: Done  Pharmacologic Plan: No change in therapy,  at this time  Laboratory Chemistry  Inflammation Markers Lab Results  Component Value Date   ESRSEDRATE 46 (H) 02/20/2016   CRP 0.6 12/24/2015   Renal Function Lab Results  Component Value Date   BUN 11 02/20/2016   CREATININE 0.49 02/20/2016   GFRAA >60 02/20/2016   GFRNONAA >60 02/20/2016   Hepatic Function Lab Results  Component Value Date   AST 19 02/20/2016   ALT 16 02/20/2016   ALBUMIN 3.7 02/20/2016   Electrolytes Lab Results  Component Value Date   NA 136 02/20/2016   K 3.8 02/20/2016   CL 102 02/20/2016   CALCIUM 8.8 (L) 02/20/2016   MG 1.8 12/24/2015   Pain Modulating Vitamins Lab Results  Component Value Date   25OHVITD1 39 12/24/2015   25OHVITD2 27 12/24/2015   25OHVITD3 12 12/24/2015   VITAMINB12 287 12/24/2015   Coagulation Parameters Lab Results  Component Value Date   PLT 235 02/20/2016   Cardiovascular Lab Results  Component Value Date   BNP 25.0 12/24/2015   HGB 12.8 02/20/2016   HCT 37.9 02/20/2016   Note: Lab results reviewed.  Recent Diagnostic Imaging Review  Dg Hip Unilat With Pelvis 2-3 Views Right  Result Date: 02/20/2016 CLINICAL DATA:  56 y/o F; right hip pain started yesterday and has continued to worsen. EXAM: DG HIP (WITH OR WITHOUT PELVIS) 2-3V RIGHT COMPARISON:  None. FINDINGS: No acute fracture or dislocation is identified. Minimal osteoarthrosis of the hip joints bilaterally with productive changes in superolateral acetabula. Right femur greater trochanter small enthesophyte. Sacral stimulator device projecting over left hemipelvis with electrode along the left sacral ala. Pelvic phleboliths. IMPRESSION: No acute fracture or dislocation identified. Minimal bilateral hip osteoarthrosis. Electronically Signed   By: Kristine Garbe M.D.   On: 02/20/2016 05:56   Note: Imaging results reviewed.          Meds  The patient has a current medication list which includes the following prescription(s): abatacept, abatacept,  atorvastatin, buspirone, calcium carb-ergocalciferol, cholecalciferol, clotrimazole-betamethasone, famotidine, fluoxetine, folic acid, hydroxychloroquine, hydroxyzine, insulin aspart, klor-con m20, levothyroxine, lisinopril, lovastatin, magnesium oxide, melatonin, meloxicam, metformin, methotrexate, oxycodone, prednisone, pregabalin, promethazine, tamsulosin, and vitamin d (ergocalciferol).  Current Outpatient Prescriptions on File Prior to Visit  Medication Sig  . Abatacept (ORENCIA IV) Inject into the vein.  . Abatacept (ORENCIA [place]) Inject into the vein every 30 (thirty) days. Every 2 weeks  . atorvastatin (LIPITOR) 80 MG tablet Take 80 mg by mouth daily.  . busPIRone (BUSPAR) 10 MG tablet Take 1 tablet (10 mg total) by mouth 2 (two) times daily.  . Calcium Carb-Ergocalciferol 250-125 MG-UNIT TABS Take by mouth.  . Cholecalciferol (VITAMIN D-1000 MAX ST) 1000 UNITS tablet Take by mouth.  . clotrimazole-betamethasone (LOTRISONE) cream Apply 1 application topically 2 (two) times daily. For yeast infection under stomach  . famotidine (PEPCID) 20 MG tablet Take 20 mg by mouth 2 (two) times daily.   Marland Kitchen FLUoxetine (PROZAC) 20 MG capsule Take 1 capsule (20 mg total) by mouth daily.  . folic acid (FOLVITE) 1 MG tablet  Take two tablets to make 71m.  . hydroxychloroquine (PLAQUENIL) 200 MG tablet Take 200 mg by mouth 2 (two) times daily.  . hydrOXYzine (ATARAX/VISTARIL) 50 MG tablet Take 50 mg by mouth Twice daily.   . Insulin Aspart (NOVOLOG [place]) Inject 10 Units into the skin 2 (two) times daily. Injection twice daily  . KLOR-CON M20 20 MEQ tablet TAKE ONE TABLET BY MOUTH TWICE DAILY (Patient taking differently: take 2 tabs in the a.m. , 1 tab in the evening)  . levothyroxine (SYNTHROID, LEVOTHROID) 88 MCG tablet Take 88 mcg by mouth daily before breakfast.  . lisinopril (PRINIVIL,ZESTRIL) 2.5 MG tablet Take 2.5 mg by mouth daily.  .Marland Kitchenlovastatin (MEVACOR) 40 MG tablet Take 40 mg by mouth.  . Magnesium  Oxide 500 MG CAPS Take 1 capsule (500 mg total) by mouth 2 (two) times daily at 8 am and 10 pm.  . Melatonin 10 MG CAPS Take 20 mg by mouth at bedtime as needed.  . meloxicam (MOBIC) 7.5 MG tablet Take 1 tablet (7.5 mg total) by mouth 2 (two) times daily after a meal.  . metFORMIN (GLUCOPHAGE) 500 MG tablet Take 1,000 mg by mouth daily.   . methotrexate 50 MG/2ML injection Inject 1 mL into the muscle once a week.  . predniSONE (DELTASONE) 10 MG tablet Take 20-25 mg by mouth daily as needed (Pain scale  Used to determine dosage). Reported on 08/29/2015  . promethazine (PHENERGAN) 25 MG tablet Take 25 mg by mouth every 6 (six) hours as needed. For nausea  . tamsulosin (FLOMAX) 0.4 MG CAPS Take 0.4 mg by mouth daily.  . Vitamin D, Ergocalciferol, (DRISDOL) 50000 units CAPS capsule Take by mouth.   No current facility-administered medications on file prior to visit.    ROS  Constitutional: Denies any fever or chills Gastrointestinal: No reported hemesis, hematochezia, vomiting, or acute GI distress Musculoskeletal: Denies any acute onset joint swelling, redness, loss of ROM, or weakness Neurological: No reported episodes of acute onset apraxia, aphasia, dysarthria, agnosia, amnesia, paralysis, loss of coordination, or loss of consciousness  Allergies  [name] [name] is allergic to codeine; propoxyphene; propoxyphene; sulfa antibiotics; hydrocodone; and sulfasalazine.  PFSH  Drug: Ms. DLaver reports that she does not use drugs. Alcohol:  reports that she does not drink alcohol. Tobacco:  reports that she quit smoking about 16 years ago. Her smoking use included Cigarettes. She has a 54.00 pack-year smoking history. She has never used smokeless tobacco. Medical:  has a past medical history of Abdominal wall hernia (01/29/2013); Anxiety; Arthritis; C. difficile colitis; Chronic diastolic heart failure ([place]; Depression; Diabetes mellitus; Diarrhea (10/22/2015); Diastolic CHF ([place]; Esophagitis; Fluid  retention; GERD (gastroesophageal reflux disease); Hiatal hernia; Hypertension; Hypokalemia due to loss of potassium (10/21/2015); Hypothyroidism; IBS (irritable bowel syndrome); Moderate episode of recurrent major depressive disorder ([place] (06/03/2004); Morbid obesity ([place]; Nausea & vomiting (08/04/2011); Neurogenic bladder; Neuropathy ([place]; Obesity; Panic attacks; Rheumatoid arthritis ([place]; and Sleep apnea. Family: family history includes Alcohol abuse in her father and sister; Anxiety disorder in her father, sister, and sister; Bipolar disorder in her father and sister; Depression in her father, sister, and sister; Drug abuse in her sister; Heart attack in her brother; Heart attack (age of onset: 560 in her brother; Heart disease in her brother; Heart failure in her father.  Past Surgical History:  Procedure Laterality Date  . ABDOMINAL HYSTERECTOMY    . CHOLECYSTECTOMY    . EYE SURGERY     bilateral cataract extraction with IOL  .  HERNIA REPAIR     ventral hernia with strangulation  . LAPAROSCOPIC GASTRIC BANDING  03/20/07  . TONSILLECTOMY    . TUBAL LIGATION     Constitutional Exam  General appearance: Well nourished, well developed, and well hydrated. In no apparent acute distress Vitals:   04/21/16 1354  BP: 113/75  Pulse: 80  Resp: 16  Temp: 98.6 F (37 C)  TempSrc: Oral  SpO2: 98%  Weight: 290 lb (131.5 kg)  Height: _0  (1.676 m)   BMI Assessment: Estimated body mass index is 46.81 kg/m as calculated from the following:   Height as of this encounter: _1  (1.676 m).   Weight as of this encounter: 290 lb (131.5 kg).  BMI interpretation table: BMI level Category Range association with higher incidence of chronic pain  <18 kg/m2 Underweight   18.5-24.9 kg/m2 Ideal body weight   25-29.9 kg/m2 Overweight Increased incidence by 20%  30-34.9 kg/m2 Obese (Class I) Increased incidence by 68%  35-39.9 kg/m2 Severe obesity (Class II) Increased incidence by 136%  >40 kg/m2  Extreme obesity (Class III) Increased incidence by 254%   BMI Readings from Last 4 Encounters:  04/21/16 46.81 kg/m  03/31/16 46.81 kg/m  02/20/16 44.06 kg/m  12/24/15 41.80 kg/m   Wt Readings from Last 4 Encounters:  04/21/16 290 lb (131.5 kg)  03/31/16 290 lb (131.5 kg)  02/20/16 273 lb (123.8 kg)  12/24/15 259 lb (117.5 kg)  Psych/Mental status: Alert, oriented x 3 (person, place, & time) Eyes: PERLA Respiratory: No evidence of acute respiratory distress  Cervical Spine Exam  Inspection: No masses, redness, or swelling Alignment: Symmetrical Functional ROM: Unrestricted ROM Stability: No instability detected Muscle strength & Tone: Functionally intact Sensory: Unimpaired Palpation: Non-contributory  Upper Extremity (UE) Exam    Side: Right upper extremity  Side: Left upper extremity  Inspection: No masses, redness, swelling, or asymmetry  Inspection: No masses, redness, swelling, or asymmetry  Functional ROM: Unrestricted ROM          Functional ROM: Unrestricted ROM          Muscle strength & Tone: Functionally intact  Muscle strength & Tone: Functionally intact  Sensory: Unimpaired  Sensory: Unimpaired  Palpation: Non-contributory  Palpation: Non-contributory   Thoracic Spine Exam  Inspection: No masses, redness, or swelling Alignment: Symmetrical Functional ROM: Unrestricted ROM Stability: No instability detected Sensory: Unimpaired Muscle strength & Tone: Functionally intact Palpation: Non-contributory  Lumbar Spine Exam  Inspection: No masses, redness, or swelling Alignment: Symmetrical Functional ROM: Unrestricted ROM Stability: No instability detected Muscle strength & Tone: Functionally intact Sensory: Unimpaired Palpation: Non-contributory Provocative Tests: Lumbar Hyperextension and rotation test: evaluation deferred today       Patrick's Maneuver: evaluation deferred today              Gait & Posture Assessment  Ambulation: Unassisted Gait:  Relatively normal for age and body habitus Posture: WNL   Lower Extremity Exam    Side: Right lower extremity  Side: Left lower extremity  Inspection: No masses, redness, swelling, or asymmetry  Inspection: No masses, redness, swelling, or asymmetry  Functional ROM: Unrestricted ROM          Functional ROM: Unrestricted ROM          Muscle strength & Tone: Functionally intact  Muscle strength & Tone: Functionally intact  Sensory: Unimpaired  Sensory: Unimpaired  Palpation: Non-contributory  Palpation: Non-contributory   Assessment  Primary Diagnosis & Pertinent Problem List: The primary encounter diagnosis was Chronic pain  syndrome. Diagnoses of Long term current use of opiate analgesic, Opiate use (30 MME/Day), Chronic foot pain (Location of Primary Source of Pain) (Bilateral) (L>R), Chronic wrist pain (Location of Secondary source of pain) (Bilateral) (L>R), Chronic elbow pain (Location of Tertiary source of pain) (Bilateral) (L>R), Diabetic peripheral neuropathy (HCC), Neuropathic pain, and Neurogenic pain were also pertinent to this visit.  Status Diagnosis   Stable  Stable  Stable 1. Chronic pain syndrome   2. Long term current use of opiate analgesic   3. Opiate use (30 MME/Day)   4. Chronic foot pain (Location of Primary Source of Pain) (Bilateral) (L>R)   5. Chronic wrist pain (Location of Secondary source of pain) (Bilateral) (L>R)   6. Chronic elbow pain (Location of Tertiary source of pain) (Bilateral) (L>R)   7. Diabetic peripheral neuropathy ([place])   8. Neuropathic pain   9. Neurogenic pain      Plan of Care  Pharmacotherapy (Medications Ordered): Meds ordered this encounter  Medications  . oxyCODONE (OXY IR/ROXICODONE) 5 MG immediate release tablet    Sig: Take 1 tablet (5 mg total) by mouth every 6 (six) hours as needed for severe pain.    Dispense:  120 tablet    Refill:  0    Do not place this medication, or any other prescription from our practice, on "Automatic  Refill". Patient may have prescription filled one day early if pharmacy is closed on scheduled refill date. Do not fill until: 04/30/16 To last until: 05/30/16  . pregabalin (LYRICA) 150 MG capsule    Sig: Take 1 capsule (150 mg total) by mouth 3 (three) times daily.    Dispense:  90 capsule    Refill:  0    Do not add this medication to the electronic "Automatic Refill" notification system. Patient may have prescription filled one day early if pharmacy is closed on scheduled refill date.   New Prescriptions   No medications on file   Medications administered today: [name] [name] had no medications administered during this visit. Lab-work, procedure(s), and/or referral(s): Orders Placed This Encounter  Procedures  . LIDOCAINE INFUSION   Imaging and/or referral(s): None  Interventional therapies: Planned, scheduled, and/or pending:   IV lidocaine infusion   Considering:   IV lidocaine infusions.  Diagnostic bilateral lumbar sympathetic block  Intra-articular injection with local anesthetic and steroids.  Diagnostic bilateral intra-articular shoulder joint injection under fluoroscopic guidance, with or without sedation.  Diagnostic bilateral intra-articular knee injections with local anesthetic and steroid, under fluoroscopic guidance, no sedation.  Diagnostic right-sided cervical epidural steroid injections under fluoroscopic guidance and IV sedation.  Diagnostic bilateral Cervicalfacet block under fluoroscopic guidance and IV sedation.  Possible bilateral cervical facet radiofrequency ablation.    Palliative PRN treatment(s):   Not at this time.   Provider-requested follow-up: Return in about 1 month (around 05/22/2016) for Med-Mgmt, in addition, procedure (ASAA) (IV Lidocaine).  Future Appointments Date Time Provider [place]  04/26/2016 9:15 AM Milinda Pointer, MD [facility] None  07/01/2016 8:15 AM Milinda Pointer, MD [facility] None  07/01/2016 1:00 PM Gonzella Lex, MD [facility] None   Primary Care Physician: [facility] Location: [facility] Outpatient Pain Management Facility Note by: Kathlen Brunswick. Dossie Arbour, M.D, DABA, DABAPM, DABPM, DABIPP, FIPP Date: 04/21/16; Time: 3:49 PM  Pain Score Disclaimer: We use the NRS-11 scale. This is a self-reported, subjective measurement of pain severity with only modest accuracy. It is used primarily to identify changes within a particular patient. It must be understood  that outpatient pain scales are significantly less accurate that those used for research, where they can be applied under ideal controlled circumstances with minimal exposure to variables. In reality, the score is likely to be a combination of pain intensity and pain affect, where pain affect describes the degree of emotional arousal or changes in action readiness caused by the sensory experience of pain. Factors such as social and work situation, setting, emotional state, anxiety levels, expectation, and prior pain experience may influence pain perception and show large inter-individual differences that may also be affected by time variables.  Patient instructions provided during this appointment: Patient Instructions  You have prescriptions for Lyrica and Oxycodone. You may pick them up tomorrow. Please bring someone who can drive you home after the Lidocaine infusion.

## 2016-04-21 NOTE — Patient Instructions (Signed)
You have prescriptions for Lyrica and Oxycodone. You may pick them up tomorrow. Please bring someone who can drive you home after the Lidocaine infusion.

## 2016-04-21 NOTE — Progress Notes (Signed)
Nursing Pain Medication Assessment:  Safety precautions to be maintained throughout the outpatient stay will include: orient to surroundings, keep bed in low position, maintain call bell within reach at all times, provide assistance with transfer out of bed and ambulation.  Medication Inspection Compliance: Ms. Lymon did not comply with our request to bring her pills to be counted. She was reminded that bringing the medication bottles, even when empty, is a requirement. Pill Count: No pills available to be counted today. Bottle Appearance: No container available. Did not bring bottle(s) to appointment. Medication: None brought in. Filled Date: N/A

## 2016-04-26 ENCOUNTER — Ambulatory Visit: Payer: Medicaid Other | Admitting: Pain Medicine

## 2016-04-26 NOTE — Progress Notes (Signed)
Nursing Pain Medication Assessment:  Safety precautions to be maintained throughout the outpatient stay will include: orient to surroundings, keep bed in low position, maintain call bell within reach at all times, provide assistance with transfer out of bed and ambulation.  Medication Inspection Compliance: Pill count conducted under aseptic conditions, in front of the patient. Neither the pills nor the bottle was removed from the patient's sight at any time. Once count was completed pills were immediately returned to the patient in their original bottle.  Medication: Oxycodone IR Pill Count: 24 of 120 pills remain Bottle Appearance: Standard pharmacy container. Clearly labeled. Filled Date: 18 / 13 / 2017 Medication last intake: 04-26-16 at [facility]

## 2016-04-28 ENCOUNTER — Ambulatory Visit: Payer: Medicaid Other | Attending: Pain Medicine | Admitting: Pain Medicine

## 2016-04-28 ENCOUNTER — Encounter: Payer: Self-pay | Admitting: Pain Medicine

## 2016-04-28 VITALS — BP 139/63 | HR 76 | Temp 98.4°F | Resp 16 | Ht 66.0 in | Wt 298.0 lb

## 2016-04-28 DIAGNOSIS — Z9884 Bariatric surgery status: Secondary | ICD-10-CM | POA: Diagnosis not present

## 2016-04-28 DIAGNOSIS — M792 Neuralgia and neuritis, unspecified: Secondary | ICD-10-CM

## 2016-04-28 DIAGNOSIS — Z9851 Tubal ligation status: Secondary | ICD-10-CM | POA: Insufficient documentation

## 2016-04-28 DIAGNOSIS — G8929 Other chronic pain: Secondary | ICD-10-CM

## 2016-04-28 DIAGNOSIS — E1142 Type 2 diabetes mellitus with diabetic polyneuropathy: Secondary | ICD-10-CM

## 2016-04-28 DIAGNOSIS — Z9049 Acquired absence of other specified parts of digestive tract: Secondary | ICD-10-CM | POA: Insufficient documentation

## 2016-04-28 DIAGNOSIS — G4700 Insomnia, unspecified: Secondary | ICD-10-CM | POA: Diagnosis not present

## 2016-04-28 DIAGNOSIS — G894 Chronic pain syndrome: Secondary | ICD-10-CM | POA: Insufficient documentation

## 2016-04-28 DIAGNOSIS — G4701 Insomnia due to medical condition: Secondary | ICD-10-CM

## 2016-04-28 DIAGNOSIS — Z90710 Acquired absence of both cervix and uterus: Secondary | ICD-10-CM | POA: Diagnosis not present

## 2016-04-28 MED ORDER — OXYCODONE HCL 5 MG PO TABS: 5.0000 mg | ORAL_TABLET | Freq: Four times a day (QID) | ORAL | 0 refills | Status: DC | PRN

## 2016-04-28 MED ORDER — PREGABALIN 150 MG PO CAPS: 150.0000 mg | ORAL_CAPSULE | Freq: Three times a day (TID) | ORAL | 0 refills | Status: DC

## 2016-04-28 MED ORDER — MELATONIN 10 MG PO CAPS: 20.0000 mg | ORAL_CAPSULE | Freq: Every evening | ORAL | 0 refills | Status: DC | PRN

## 2016-04-28 MED ORDER — LIDOCAINE IN D5W 4-5 MG/ML-% IV SOLN
INTRAVENOUS | Status: AC
  Administered 2016-04-28: 9 mg/min via INTRAVENOUS
  Filled 2016-04-28: qty 500

## 2016-04-28 MED ORDER — LACTATED RINGERS IV SOLN
1000.0000 mL | Freq: Once | INTRAVENOUS | Status: AC
  Administered 2016-04-28: 1000 mL via INTRAVENOUS

## 2016-04-28 MED ORDER — MIDAZOLAM HCL 5 MG/5ML IJ SOLN
1.0000 mg | INTRAMUSCULAR | Status: DC | PRN
  Administered 2016-04-28: 2 mg via INTRAVENOUS
  Filled 2016-04-28: qty 5

## 2016-04-28 MED ORDER — LIDOCAINE IN D5W 4-5 MG/ML-% IV SOLN: 4.0000 mg/min | INTRAVENOUS | Status: DC

## 2016-04-28 MED ORDER — LIDOCAINE IN D5W 4-5 MG/ML-% IV SOLN: 9.0000 mg/min | INTRAVENOUS | Status: AC

## 2016-04-28 MED ORDER — MAGNESIUM OXIDE -MG SUPPLEMENT 500 MG PO CAPS: 1.0000 | ORAL_CAPSULE | Freq: Two times a day (BID) | ORAL | 0 refills | Status: DC

## 2016-04-28 NOTE — Progress Notes (Signed)
Patient's Name: [name]  MRN: [id number]  Referring Provider: Delano Metz, MD  DOB: [birth year]  PCP: Gabriel Rung  DOS: 04/28/2016  Note by: Sydnee Levans. Laban Emperor, MD  Service setting: Ambulatory outpatient  Location: [facility] (AMB) Pain Management Facility  Visit type: Procedure  Specialty: Interventional Pain Management  Patient type: Established   Primary Reason for Visit: Interventional Pain Management Treatment. CC: Hand Pain (bilateral) and Foot Pain (bilateral)  Procedure:  Anesthesia, Analgesia, Anxiolysis:  Type: Palliative Intravenous lidocaine infusion Region: Systemic Level: Upper Extremity IV access Laterality: Please see nurses note.  Type: Moderate (Conscious) Sedation Route: Intravenous (IV) IV Access: Secured Sedation: Meaningful verbal contact was maintained at all times during the procedure  Indication(s): Analgesia, anxiolysis, and seizure premedication  Indications: 1. Chronic pain syndrome   2. Diabetic peripheral neuropathy (HCC)   3. 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 Neurogenic pain   6. Hypomagnesemia    Pain Score: Pre-procedure: 2 /10 Post-procedure: 0-No pain/10  Pre-Procedure Assessment:  [name] [name] is a 56 y.o. (year old), female patient, seen today for interventional treatment. She  has a past surgical history that includes Tubal ligation; Tonsillectomy; Cholecystectomy; Abdominal hysterectomy; Laparoscopic gastric banding (03/20/07); Eye surgery; and Hernia repair.. Her primarily concern today is the Hand Pain (bilateral) and Foot Pain (bilateral) The primary encounter diagnosis was Chronic pain syndrome. Diagnoses of Diabetic peripheral neuropathy (HCC), Neuropathic pain, Insomnia secondary to chronic pain, Neurogenic pain, and Hypomagnesemia were also pertinent to this visit.  Pain Location: Hand (and foot) Pain Orientation: Right, Left Pain Descriptors / Indicators: Sharp Pain Frequency:  Constant  Date of Last Visit: 04/21/16 Service Provided on Last Visit: Evaluation  Consent: Before the procedure and under the influence of no sedative(s), amnesic(s), or anxiolytics, the patient was informed of the treatment options, risks and possible complications. To fulfill our ethical and legal obligations, as recommended by the American Medical Association's Code of Ethics, I have informed the patient of my clinical impression; the nature and purpose of the treatment or procedure; the risks, benefits, and possible complications of the intervention; the alternatives, including doing nothing; the risk(s) and benefit(s) of the alternative treatment(s) or procedure(s); and the risk(s) and benefit(s) of doing nothing. The patient was provided information about the general risks and possible complications associated with any invasive procedure. These may include, but are not limited to: failure to achieve desired goals; pain; worsening of initial condition; infections; bleeding; organ or nerve damage; allergic reactions; and death. In addition, the patient was informed of those risks and complications associated to this procedure, such as failure to decrease pain; infection; bleeding; phlebitis; vascular extravasation; tissue necrosis; tenderness at IV access site; skin or nerve damage with subsequent damage to sensory, motor, and/or autonomic systems; worsening of the pain; persistent pain, numbness, and/or weakness of one or several areas of the body; cardiac dysrhythmias; seizure; stroke; and/or death. Furthermore, the patient was informed of those risks and complications associated with the medications used during the procedure. These include, but are not limited to: allergic reactions (i.e.: anaphylactic or anaphylactoid reactions); cardiac conduction blockade; poisoning; toxicity; CNS depression; cardiovascular depression and collapse; muscle twitching; tonic-clonic seizures; convulsions; loss of  consciousness; coma; respiratory depression; arrest; and/or death. Finally, the patient was informed that Medicine is not an exact science; therefore, there is also the possibility of unforeseen or unpredictable risks and/or possible complications that may result in a catastrophic outcome. The patient indicated having understood very clearly.  We have given the patient no guarantees and we have made no promises. Enough time was given to the patient to ask questions, all of which were answered to the patient's satisfaction. [name] [name] has indicated that she wanted to continue with the procedure.  Consent Attestation: I, the ordering provider, attest that I have discussed with the patient the benefits, risks, side-effects, alternatives, likelihood of achieving goals, and potential problems during recovery for the procedure that I have provided informed consent.  Pre-Procedure Preparation:  Safety Precautions: Allergies reviewed. The patient was asked about blood thinners, or active infections, both of which were denied. The patient was asked to confirm the procedure and laterality, before marking the site, and again before commencing the procedure. Appropriate site, procedure, and patient were confirmed by following the Joint Commission's Universal Protocol (UP.01.01.01), in the form of a "Time Out". The patient was asked to participate by confirming the accuracy of the "Time Out" information. Patient was assessed for positional comfort and pressure points before starting the procedure. Allergies: She is allergic to codeine; propoxyphene; propoxyphene; sulfa antibiotics; hydrocodone; and sulfasalazine. Allergy Precautions: None required Infection Control Precautions: Sterile technique used. Standard Universal Precautions were taken as recommended by the Department of [facility] for Disease Control and Prevention (CDC). Standard pre-surgical skin prep was conducted. Respiratory hygiene and cough  etiquette was practiced. Hand hygiene observed. Safe injection practices and needle disposal techniques followed. SDV (single dose vial) medications used. Medications properly checked for expiration dates and contaminants. Personal protective equipment (PPE) used as per protocol. Monitoring:  As per clinic protocol. [name] [name] was monitored using continuous ECG and cardiac rate monitoring, intermittent blood pressure evaluation, continues pulse oximetry, and constant monitoring using meaningful verbal contact during the entire procedure. A nurse stayed at the patient's bedside during the entire procedure. Vitals:   04/28/16 1127 04/28/16 1137 04/28/16 1149 04/28/16 1157  BP: 134/64 138/70 (!) 141/65 139/63  Pulse:      Resp: 16 18 16 16   Temp:      TempSrc:      SpO2: 98% 98% 98% 100%  Weight:      Height:      Calculated BMI: Body mass index is 48.1 kg/m. Time-out: "Time-out" completed before starting procedure, as per protocol.  Description of Procedure Process:   Position: Supine Target Area: Intravenous Approach: Intravenous angiocath approach. Area Prepped: Antecubital Prepping solution: Isopropyl Alcohol (70%) Safety Precautions: Medications properly checked for expiration dates. SDV (single dose vial) medications used. Dose Calculation: Total Lidocaine Dose: 4 mg/kg x 298 lb (135.2 kg) =  540.8 mg administered in 250 mL of D5W. Duration of Infusion: 1 hour Description of the Procedure: Protocol guidelines were followed. The patient was placed in position. Informed consent was obtained.  She was cautioned to be on the look out for prodromal symptoms of a possible impending seizure, such as: new onset tinnitus; perioral, circumoral, or tongue numbness; or a metallic taste in her mouth, lightheadedness; dizziness; visual or auditory disturbances; disorientation; profound drowsiness; or muscle twitching.  An infusion of IV lidocaine containing 2 grams of IV lidocaine in 500 mL of D5W at  a concentration 4 mg/mL (0.4% lidocaine) was slowly infused at a rate of 1 mL/min (4 mg/min) for approximately 54 minutes.  EBL: None Materials: IV infusion pump Medication(s): IV Lidocaine.  Imaging Guidance:  Type of Imaging Technique: None used Indication(s): N/A Exposure Time: No patient exposure Contrast: None used. Fluoroscopic Guidance: N/A Ultrasound Guidance: N/A Interpretation: N/A  Antibiotic Prophylaxis:  Indication(s): No indications identified. Type:  Antibiotics Given (last 72 hours)    None      Post-operative Assessment:  Complications: No immediate post-treatment complications observed by team, or reported by patient. Disposition: The patient tolerated the entire procedure well. A repeat set of vitals were taken after the procedure and the patient was kept under observation following institutional policy, for this type of procedure. Post-procedural neurological assessment was performed, showing return to baseline, prior to discharge. The patient was provided with post-procedure discharge instructions, including a section on how to identify potential problems. Should any problems arise concerning this procedure, the patient was given instructions to immediately contact us, at any time, without hesitation. In any case, we plan to contact the patient by telephone for a follow-up status report regarding this interventional procedure. Comments:  No additional relevant information.  Plan of Care  Discharge to: Discharge home  Medications ordered for procedure: Meds ordered this encounter  Medications  . lactated ringers infusion 1,000 mL  . midazolam (VERSED) 5 MG/5ML injection 1-2 mg    Make sure Flumazenil is available in the pyxis when using this medication. If oversedation occurs, administer 0.2 mg IV over 15 sec. If after 45 sec no response, administer 0.2 mg again over 1 min; may repeat at 1 min intervals; not to exceed 4 doses (1 mg)  . oxyCODONE (OXY  IR/ROXICODONE) 5 MG immediate release tablet    Sig: Take 1 tablet (5 mg total) by mouth every 6 (six) hours as needed for severe pain.    Dispense:  120 tablet    Refill:  0    Do not place this medication, or any other prescription from our practice, on "Automatic Refill". Patient may have prescription filled one day early if pharmacy is closed on scheduled refill date. Do not fill until: 05/30/16 To last until: 06/29/16  . Melatonin 10 MG CAPS    Sig: Take 20 mg by mouth at bedtime as needed.    Dispense:  180 capsule    Refill:  0    Do not place this medication, or any other prescription from our practice, on "Automatic Refill". Patient may have prescription filled one day early if pharmacy is closed on scheduled refill date.  . pregabalin (LYRICA) 150 MG capsule    Sig: Take 1 capsule (150 mg total) by mouth 3 (three) times daily.    Dispense:  270 capsule    Refill:  0    Do not add this medication to the electronic "Automatic Refill" notification system. Patient may have prescription filled one day early if pharmacy is closed on scheduled refill date.  . Magnesium Oxide 500 MG CAPS    Sig: Take 1 capsule (500 mg total) by mouth 2 (two) times daily at 8 am and 10 pm.    Dispense:  180 capsule    Refill:  0    Do not add to the "Automatic Refill" notification system.  Marland Kitchen oxyCODONE (OXY IR/ROXICODONE) 5 MG immediate release tablet    Sig: Take 1 tablet (5 mg total) by mouth every 6 (six) hours as needed for severe pain.    Dispense:  120 tablet    Refill:  0    Do not place this medication, or any other prescription from our practice, on "Automatic Refill". Patient may have prescription filled one day early if pharmacy is closed on scheduled refill date. Do not fill until: 06/29/16 To last until: 07/29/16  . oxyCODONE (OXY IR/ROXICODONE) 5 MG  immediate release tablet    Sig: Take 1 tablet (5 mg total) by mouth every 6 (six) hours as needed for severe pain.    Dispense:  120 tablet     Refill:  0    Do not place this medication, or any other prescription from our practice, on "Automatic Refill". Patient may have prescription filled one day early if pharmacy is closed on scheduled refill date. Do not fill until: 07/29/16 To last until: 08/28/16   Medications administered: We administered lidocaine, lactated ringers, and midazolam.  See the medical record for exact dosing, route, and time of administration.  Lab-work, Procedure(s), & Referral(s) Ordered: Orders Placed This Encounter  Procedures  . Informed Consent Details: Transcribe to consent form and obtain patient signature  . Discharge instructions  . Follow-up  . Provider attestation of informed consent for procedure/surgical case  . Verify informed consent   Physician-requested Follow-up:  Return in about 3 months (around 07/27/2016) for (NP) Med-Mgmt. . Prior appointment to be used for postprocedure evaluation.  Future Appointments Date Time Provider Department Center  07/01/2016 8:15 AM Delano Metz, MD [facility] None  07/01/2016 1:00 PM Audery Amel, MD [facility] None   Primary Care Physician: [facility] Location: [facility] Note by: Sydnee Levans. Laban Emperor, M.D, DABA, DABAPM, DABPM, DABIPP, FIPP Date: 04/28/16; Time: 9:34 PM  Disclaimer:  Medicine is not an Visual merchandiser. The only guarantee in medicine is that nothing is guaranteed. It is important to note that the decision to proceed with this intervention was based on the information collected from the patient. The Data and conclusions were drawn from the patient's questionnaire, the interview, and the physical examination. Because the information was provided in large part by the patient, it cannot be guaranteed that it has not been purposely or unconsciously manipulated. Every effort has been made to obtain as much relevant data as possible for this evaluation. It is important to note that the  conclusions that lead to this procedure are derived in large part from the available data. Always take into account that the treatment will also be dependent on availability of resources and existing treatment guidelines, considered by other Pain Management Practitioners as being common knowledge and practice, at the time of the intervention. For Medico-Legal purposes, it is also important to point out that variation in procedural techniques and pharmacological choices are the acceptable norm. The indications, contraindications, technique, and results of the above procedure should only be interpreted and judged by a Board-Certified Interventional Pain Specialist with extensive familiarity and expertise in the same exact procedure and technique. Attempts at providing opinions without similar or greater experience and expertise than that of the treating physician will be considered as inappropriate and unethical, and shall result in a formal complaint to the state medical board and applicable specialty societies.  Instructions provided at this appointment: Patient Instructions  Pain Management Discharge Instructions  General Discharge Instructions :  If you need to reach your doctor call: Monday-Friday 8:00 am - 4:00 pm at [phone] or toll free [phone].  After clinic hours [phone] to have operator reach doctor.  Bring all of your medication bottles to all your appointments in the pain clinic.  To cancel or reschedule your appointment with Pain Management please remember to call 24 hours in advance to avoid a fee.  Refer to the educational materials which you have been given on: General Risks, I had my Procedure. Discharge Instructions, Post Sedation.  Post Procedure Instructions:  The drugs you were given will stay in your system until tomorrow, so for the next 24 hours you should not drive, make any legal decisions or drink any alcoholic beverages.  You may eat anything you prefer,  but it is better to start with liquids then soups and crackers, and gradually work up to solid foods.  Please notify your doctor immediately if you have any unusual bleeding, trouble breathing or pain that is not related to your normal pain.  Depending on the type of procedure that was done, some parts of your body may feel week and/or numb.  This usually clears up by tonight or the next day.  Walk with the use of an assistive device or accompanied by an adult for the 24 hours.  You may use ice on the affected area for the first 24 hours.  Put ice in a Ziploc bag and cover with a towel and place against area 15 minutes on 15 minutes off.  You may switch to heat after 24 hours.

## 2016-04-28 NOTE — Progress Notes (Signed)
Safety precautions to be maintained throughout the outpatient stay will include: orient to surroundings, keep bed in low position, maintain call bell within reach at all times, provide assistance with transfer out of bed and ambulation.  

## 2016-04-28 NOTE — Patient Instructions (Signed)

## 2016-04-29 ENCOUNTER — Telehealth: Payer: Self-pay

## 2016-04-29 NOTE — Telephone Encounter (Signed)
Post procedure phone call.  Left message.  

## 2016-06-11 ENCOUNTER — Encounter: Payer: Self-pay | Admitting: Internal Medicine

## 2016-07-01 ENCOUNTER — Encounter: Payer: Self-pay | Admitting: Pain Medicine

## 2016-07-01 ENCOUNTER — Ambulatory Visit: Payer: Medicaid Other | Attending: Pain Medicine | Admitting: Pain Medicine

## 2016-07-01 ENCOUNTER — Encounter: Payer: Self-pay | Admitting: Psychiatry

## 2016-07-01 ENCOUNTER — Ambulatory Visit (INDEPENDENT_AMBULATORY_CARE_PROVIDER_SITE_OTHER): Payer: Medicaid Other | Admitting: Psychiatry

## 2016-07-01 VITALS — BP 138/87 | HR 62 | Temp 98.7°F | Wt 307.4 lb

## 2016-07-01 VITALS — BP 126/86 | HR 58 | Temp 98.6°F | Resp 14 | Ht 66.0 in | Wt 305.0 lb

## 2016-07-01 DIAGNOSIS — F319 Bipolar disorder, unspecified: Secondary | ICD-10-CM | POA: Diagnosis not present

## 2016-07-01 DIAGNOSIS — E1142 Type 2 diabetes mellitus with diabetic polyneuropathy: Secondary | ICD-10-CM

## 2016-07-01 DIAGNOSIS — M25512 Pain in left shoulder: Secondary | ICD-10-CM | POA: Insufficient documentation

## 2016-07-01 DIAGNOSIS — I110 Hypertensive heart disease with heart failure: Secondary | ICD-10-CM | POA: Insufficient documentation

## 2016-07-01 DIAGNOSIS — M25532 Pain in left wrist: Secondary | ICD-10-CM | POA: Diagnosis present

## 2016-07-01 DIAGNOSIS — M25511 Pain in right shoulder: Secondary | ICD-10-CM | POA: Insufficient documentation

## 2016-07-01 DIAGNOSIS — G4733 Obstructive sleep apnea (adult) (pediatric): Secondary | ICD-10-CM | POA: Insufficient documentation

## 2016-07-01 DIAGNOSIS — Z79891 Long term (current) use of opiate analgesic: Secondary | ICD-10-CM | POA: Diagnosis not present

## 2016-07-01 DIAGNOSIS — G894 Chronic pain syndrome: Secondary | ICD-10-CM | POA: Diagnosis not present

## 2016-07-01 DIAGNOSIS — R339 Retention of urine, unspecified: Secondary | ICD-10-CM | POA: Insufficient documentation

## 2016-07-01 DIAGNOSIS — M79673 Pain in unspecified foot: Secondary | ICD-10-CM | POA: Diagnosis not present

## 2016-07-01 DIAGNOSIS — M25571 Pain in right ankle and joints of right foot: Secondary | ICD-10-CM | POA: Insufficient documentation

## 2016-07-01 DIAGNOSIS — M25522 Pain in left elbow: Secondary | ICD-10-CM | POA: Insufficient documentation

## 2016-07-01 DIAGNOSIS — E039 Hypothyroidism, unspecified: Secondary | ICD-10-CM | POA: Insufficient documentation

## 2016-07-01 DIAGNOSIS — M25531 Pain in right wrist: Secondary | ICD-10-CM | POA: Diagnosis not present

## 2016-07-01 DIAGNOSIS — E1165 Type 2 diabetes mellitus with hyperglycemia: Secondary | ICD-10-CM | POA: Insufficient documentation

## 2016-07-01 DIAGNOSIS — M25572 Pain in left ankle and joints of left foot: Secondary | ICD-10-CM | POA: Diagnosis present

## 2016-07-01 DIAGNOSIS — M79641 Pain in right hand: Secondary | ICD-10-CM | POA: Diagnosis not present

## 2016-07-01 DIAGNOSIS — M25561 Pain in right knee: Secondary | ICD-10-CM | POA: Insufficient documentation

## 2016-07-01 DIAGNOSIS — J449 Chronic obstructive pulmonary disease, unspecified: Secondary | ICD-10-CM | POA: Diagnosis not present

## 2016-07-01 DIAGNOSIS — M25562 Pain in left knee: Secondary | ICD-10-CM | POA: Insufficient documentation

## 2016-07-01 DIAGNOSIS — G8929 Other chronic pain: Secondary | ICD-10-CM

## 2016-07-01 DIAGNOSIS — M542 Cervicalgia: Secondary | ICD-10-CM | POA: Insufficient documentation

## 2016-07-01 DIAGNOSIS — N3946 Mixed incontinence: Secondary | ICD-10-CM | POA: Insufficient documentation

## 2016-07-01 DIAGNOSIS — F419 Anxiety disorder, unspecified: Secondary | ICD-10-CM | POA: Insufficient documentation

## 2016-07-01 DIAGNOSIS — F331 Major depressive disorder, recurrent, moderate: Secondary | ICD-10-CM | POA: Insufficient documentation

## 2016-07-01 DIAGNOSIS — E7801 Familial hypercholesterolemia: Secondary | ICD-10-CM | POA: Insufficient documentation

## 2016-07-01 DIAGNOSIS — M150 Primary generalized (osteo)arthritis: Secondary | ICD-10-CM | POA: Diagnosis not present

## 2016-07-01 DIAGNOSIS — F411 Generalized anxiety disorder: Secondary | ICD-10-CM | POA: Diagnosis not present

## 2016-07-01 DIAGNOSIS — E876 Hypokalemia: Secondary | ICD-10-CM | POA: Insufficient documentation

## 2016-07-01 DIAGNOSIS — R350 Frequency of micturition: Secondary | ICD-10-CM | POA: Diagnosis not present

## 2016-07-01 DIAGNOSIS — M160 Bilateral primary osteoarthritis of hip: Secondary | ICD-10-CM | POA: Insufficient documentation

## 2016-07-01 DIAGNOSIS — Z794 Long term (current) use of insulin: Secondary | ICD-10-CM | POA: Insufficient documentation

## 2016-07-01 DIAGNOSIS — M7502 Adhesive capsulitis of left shoulder: Secondary | ICD-10-CM | POA: Insufficient documentation

## 2016-07-01 DIAGNOSIS — I5032 Chronic diastolic (congestive) heart failure: Secondary | ICD-10-CM | POA: Insufficient documentation

## 2016-07-01 DIAGNOSIS — F1190 Opioid use, unspecified, uncomplicated: Secondary | ICD-10-CM | POA: Diagnosis not present

## 2016-07-01 DIAGNOSIS — M069 Rheumatoid arthritis, unspecified: Secondary | ICD-10-CM | POA: Insufficient documentation

## 2016-07-01 DIAGNOSIS — M8949 Other hypertrophic osteoarthropathy, multiple sites: Secondary | ICD-10-CM

## 2016-07-01 DIAGNOSIS — E1140 Type 2 diabetes mellitus with diabetic neuropathy, unspecified: Secondary | ICD-10-CM | POA: Insufficient documentation

## 2016-07-01 DIAGNOSIS — F603 Borderline personality disorder: Secondary | ICD-10-CM | POA: Insufficient documentation

## 2016-07-01 DIAGNOSIS — M792 Neuralgia and neuritis, unspecified: Secondary | ICD-10-CM

## 2016-07-01 DIAGNOSIS — M7501 Adhesive capsulitis of right shoulder: Secondary | ICD-10-CM | POA: Insufficient documentation

## 2016-07-01 DIAGNOSIS — Z6841 Body Mass Index (BMI) 40.0 and over, adult: Secondary | ICD-10-CM | POA: Insufficient documentation

## 2016-07-01 DIAGNOSIS — M159 Polyosteoarthritis, unspecified: Secondary | ICD-10-CM

## 2016-07-01 DIAGNOSIS — M25529 Pain in unspecified elbow: Secondary | ICD-10-CM

## 2016-07-01 MED ORDER — ZOLPIDEM TARTRATE 5 MG PO TABS: 5.0000 mg | ORAL_TABLET | Freq: Every day | ORAL | 5 refills | Status: DC

## 2016-07-01 MED ORDER — BUSPIRONE HCL 10 MG PO TABS: 10.0000 mg | ORAL_TABLET | Freq: Two times a day (BID) | ORAL | 5 refills | Status: DC

## 2016-07-01 MED ORDER — FLUOXETINE HCL 20 MG PO CAPS: 40.0000 mg | ORAL_CAPSULE | Freq: Every day | ORAL | 5 refills | Status: DC

## 2016-07-01 MED ORDER — PREGABALIN 150 MG PO CAPS: 150.0000 mg | ORAL_CAPSULE | Freq: Three times a day (TID) | ORAL | 0 refills | Status: DC

## 2016-07-01 NOTE — Patient Instructions (Signed)
Pain Score  Introduction: The pain score used by this practice is the Verbal Numerical Rating Scale (VNRS-11). This is an 11-point scale. It is for adults and children 10 years or older. There are significant differences in how the pain score is reported, used, and applied. Forget everything you learned in the past and learn this scoring system.  General Information: The scale should reflect your current level of pain. Unless you are specifically asked for the level of your worst pain, or your average pain. If you are asked for one of these two, then it should be understood that it is over the past 24 hours.  Basic Activities of Daily Living (ADL): Personal hygiene, dressing, eating, transferring, and using restroom.  Instructions: Most patients tend to report their level of pain as a combination of two factors, their physical pain and their psychosocial pain. This last one is also known as "suffering" and it is reflection of how physical pain affects you socially and psychologically. From now on, report them separately. From this point on, when asked to report your pain level, report only your physical pain. Use the following table for reference.  Pain Clinic Pain Levels (0-5/10)  Pain Level Score Description  No Pain 0   Mild pain 1 Nagging, annoying, but does not interfere with basic activities of daily living (ADL). Patients are able to eat, bathe, get dressed, toileting (being able to get on and off the toilet and perform personal hygiene functions), transfer (move in and out of bed or a chair without assistance), and maintain continence (able to control bladder and bowel functions). Blood pressure and heart rate are unaffected. A normal heart rate for a healthy adult ranges from 60 to 100 bpm (beats per minute).   Mild to moderate pain 2 Noticeable and distracting. Impossible to hide from other people. More frequent flare-ups. Still possible to adapt and function close to normal. It can be very  annoying and may have occasional stronger flare-ups. With discipline, patients may get used to it and adapt.   Moderate pain 3 Interferes significantly with activities of daily living (ADL). It becomes difficult to feed, bathe, get dressed, get on and off the toilet or to perform personal hygiene functions. Difficult to get in and out of bed or a chair without assistance. Very distracting. With effort, it can be ignored when deeply involved in activities.   Moderately severe pain 4 Impossible to ignore for more than a few minutes. With effort, patients may still be able to manage work or participate in some social activities. Very difficult to concentrate. Signs of autonomic nervous system discharge are evident: dilated pupils (mydriasis); mild sweating (diaphoresis); sleep interference. Heart rate becomes elevated (>115 bpm). Diastolic blood pressure (lower number) rises above 100 mmHg. Patients find relief in laying down and not moving.   Severe pain 5 Intense and extremely unpleasant. Associated with frowning face and frequent crying. Pain overwhelms the senses.  Ability to do any activity or maintain social relationships becomes significantly limited. Conversation becomes difficult. Pacing back and forth is common, as getting into a comfortable position is nearly impossible. Pain wakes you up from deep sleep. Physical signs will be obvious: pupillary dilation; increased sweating; goosebumps; brisk reflexes; cold, clammy hands and feet; nausea, vomiting or dry heaves; loss of appetite; significant sleep disturbance with inability to fall asleep or to remain asleep. When persistent, significant weight loss is observed due to the complete loss of appetite and sleep deprivation.  Blood pressure and heart   rate becomes significantly elevated. Caution: If elevated blood pressure triggers a pounding headache associated with blurred vision, then the patient should immediately seek attention at an urgent or  emergency care unit, as these may be signs of an impending stroke.    Emergency Department Pain Levels (6-10/10)  Emergency Room Pain 6 Severely limiting. Requires emergency care and should not be seen or managed at an outpatient pain management facility. Communication becomes difficult and requires great effort. Assistance to reach the emergency department may be required. Facial flushing and profuse sweating along with potentially dangerous increases in heart rate and blood pressure will be evident.   Distressing pain 7 Self-care is very difficult. Assistance is required to transport, or use restroom. Assistance to reach the emergency department will be required. Tasks requiring coordination, such as bathing and getting dressed become very difficult.   Disabling pain 8 Self-care is no longer possible. At this level, pain is disabling. The individual is unable to do even the most "basic" activities such as walking, eating, bathing, dressing, transferring to a bed, or toileting. Fine motor skills are lost. It is difficult to think clearly.   Incapacitating pain 9 Pain becomes incapacitating. Thought processing is no longer possible. Difficult to remember your own name. Control of movement and coordination are lost.   The worst pain imaginable 10 At this level, most patients pass out from pain. When this level is reached, collapse of the autonomic nervous system occurs, leading to a sudden drop in blood pressure and heart rate. This in turn results in a temporary and dramatic drop in blood flow to the brain, leading to a loss of consciousness. Fainting is one of the body's self defense mechanisms. Passing out puts the brain in a calmed state and causes it to shut down for a while, in order to begin the healing process.    Summary: 1. Refer to this scale when providing us with your pain level. 2. Be accurate and careful when reporting your pain level. This will help with your care. 3. Over-reporting  your pain level will lead to loss of credibility. 4. Even a level of 1/10 means that there is pain and will be treated at our facility. 5. High, inaccurate reporting will be documented as "Symptom Exaggeration", leading to loss of credibility and suspicions of possible secondary gains such as obtaining more narcotics, or wanting to appear disabled, for fraudulent reasons. 6. Only pain levels of 5 or below will be seen at our facility. 7. Pain levels of 6 and above will be sent to the Emergency Department and the appointment cancelled. _____________________________________________________________________________________________   

## 2016-07-01 NOTE — Progress Notes (Signed)
Safety precautions to be maintained throughout the outpatient stay will include: orient to surroundings, keep bed in low position, maintain call bell within reach at all times, provide assistance with transfer out of bed and ambulation.  

## 2016-07-01 NOTE — Progress Notes (Signed)
Patient's Name: [name] [name]  MRN: [id number]  Referring Provider: [place], [place]: 12-Oct-1960  PCP: Morrell Riddle  DOS: 07/01/2016  Note by: Kathlen Brunswick. Dossie Arbour, MD  Service setting: Ambulatory outpatient  Specialty: Interventional Pain Management  Location: [facility] (AMB) Pain Management Facility    Patient type: Established   Primary Reason(s) for Visit: Encounter for prescription drug management & post-procedure evaluation of chronic illness with mild to moderate exacerbation(Level of risk: moderate) CC: Wrist Pain (left) and Foot Pain (bilateral)  HPI  [name] [name] is a 56 y.o. year old, female patient, who comes today for a post-procedure evaluation and medication management. She has History of laparoscopic adjustable gastric banding, 03/20/2007.  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; Shortness of breath; Obstruction of urinary tract; FOM (frequency of micturition); Urge incontinence of urine; Chronic wrist pain (Location of Secondary source of pain) (Bilateral) (L>R); Adhesive capsulitis; Borderline personality disorder; Chronic diastolic CHF (congestive heart failure), NYHA class 3 (HCC); GERD (gastroesophageal reflux disease); Hypokalemia; Hypomagnesemia; Mixed anxiety depressive disorder; QT prolongation; Type 2 diabetes mellitus with hyperglycemia ([place]); Vitamin D deficiency; 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[employer]); Chronic knee pain (Bilateral) (R>L); Long term current use of opiate analgesic; Long term prescription opiate use; Opiate use (30 MME/Day); Neurogenic pain; Neuropathic pain; Diabetic peripheral  neuropathy ([place]); Encounter for therapeutic drug level monitoring; Encounter for pain management planning; Morbid obesity with BMI of 40.0-44.9, adult ([place]); Osteoarthritis, multiple sites; Chronic foot pain (Location of Primary Source of Pain) (Bilateral) (L>R); Chronic elbow pain (Location of Tertiary source of pain) (Bilateral) (L>R); Chronic shoulder pain (Bilateral) (L>R); Chronic neck pain (Bilateral) (R>L); Chronic upper back pain; Chronic hand pain (Bilateral) (L>R); Rheumatoid arthritis involving multiple joints ([place]); Chronic pain syndrome; Insomnia secondary to chronic pain; and Hypothyroidism on her problem list. Her primarily concern today is the Wrist Pain (left) and Foot Pain (bilateral)  Pain Assessment: Self-Reported Pain Score: 2 /10             Reported level is compatible with observation.       Pain Type: Chronic pain Pain Location: Wrist Pain Orientation: Left Pain Descriptors / Indicators: Sharp Pain Frequency: Intermittent  [name] [name] was last seen on 04/28/2016 for a procedure. During today's appointment we reviewed [name] [name]'s post-procedure results, as well as her outpatient medication regimen.  Further details on both, my assessment(s), as well as the proposed treatment plan, please see below.  Controlled Substance Pharmacotherapy Assessment REMS (Risk Evaluation and Mitigation Strategy)  Analgesic:Oxycodone IR 5 mg one tablet by mouth every 6 hours (20 mg/dayof oxycodone) MME/day:20m/day  DLandis Martins RN  07/01/2016  8:16 AM  Sign at close encounter Safety precautions to be maintained throughout the outpatient stay will include: orient to surroundings, keep bed in low position, maintain call bell within reach at all times, provide assistance with transfer out of bed and ambulation.    Pharmacokinetics: Liberation and absorption (onset of action): WNL Distribution (time to peak effect): WNL Metabolism and excretion (duration of action): WNL          Pharmacodynamics: Desired effects: Analgesia: Ms. DHutmacherreports >50% benefit. Functional ability: Patient reports that medication allows her to accomplish basic ADLs Clinically meaningful improvement in function (CMIF): Sustained CMIF goals met Perceived effectiveness: Described as relatively effective,  allowing for increase in activities of daily living (ADL) Undesirable effects: Side-effects or Adverse reactions: None reported Monitoring: [place] PMP: Online review of the past 1-monthperiod conducted. Compliant with practice rules and regulations List of all UDS test(s) done:  Lab Results  Component Value Date   TOXASSSELUR FINAL 03/19/2015   SUMMARY FINAL 12/24/2015   Last UDS on record: ToxAssure Select 13  Date Value Ref Range Status  03/19/2015 FINAL  Final    Comment:    ==================================================================== TOXASSURE SELECT 13 (MW) ==================================================================== Test                             Result       Flag       Units Drug Present and Declared for Prescription Verification   Oxycodone                      455          EXPECTED   ng/mg creat   Noroxycodone                   2736         EXPECTED   ng/mg creat    Sources of oxycodone include scheduled prescription medications.    Noroxycodone is an expected metabolite of oxycodone. Drug Present not Declared for Prescription Verification   Alcohol, Ethyl                 >0.400       UNEXPECTED g/dL    Sources of ethyl alcohol include alcoholic beverages or as a    fermentation product of glucose; glucose is present in this    specimen.  The high concentration of ethyl alcohol and the    presence of glucose supports fermentation as the source of ethyl    alcohol in this specimen. ==================================================================== Test                      Result    Flag   Units      Ref Range   Creatinine              44                mg/dL      >=20 ==================================================================== Declared Medications:  The flagging and interpretation on this report are based on the  following declared medications.  Unexpected results may arise from  inaccuracies in the declared medications.  **Note: The testing scope of this panel includes these medications:  Oxycodone (Percocet)  **Note: The testing scope of this panel does not include following  reported medications:  Acetaminophen (Percocet)  Gabapentin ==================================================================== For clinical consultation, please call [phone] ====================================================================    UDS interpretation: Compliant          Medication Assessment Form: Reviewed. Patient indicates being compliant with therapy Treatment compliance: Compliant Risk Assessment Profile: Aberrant behavior: See prior evaluations. None observed or detected today Comorbid factors increasing risk of overdose: See prior notes. No additional risks detected today Risk of substance use disorder (SUD): Low Opioid Risk Tool (ORT) Total Score: 2  Interpretation Table:  Score <3 = Low Risk for SUD  Score between 4-7 = Moderate Risk for SUD  Score >8 = High Risk for Opioid Abuse   Risk Mitigation Strategies:  Patient Counseling: Covered Patient-Prescriber Agreement (PPA): Present and active  Notification to other healthcare providers: Done  Pharmacologic Plan: No change in therapy, at this time  Post-Procedure Assessment  04/28/2016 Procedure: Lidocaine infusion Post-procedure pain score: 0/10 (100% relief) Influential Factors: BMI: 49.23 kg/m Intra-procedural challenges: None observed Assessment challenges: Results reported today are inconsistent with those reported on procedure day, immediately before discharge. Previously the patient had reported 100% relief of the pain, before leaving the  facility Post-procedural side-effects, adverse reactions, or complications: None reported Reported issues: None  Sedation: Please see nurses note. When no sedatives are used, the analgesic levels obtained are directly associated to the effectiveness of the local anesthetics. However, when sedation is provided, the level of analgesia obtained during the initial 1 hour following the intervention, is believed to be the result of a combination of factors. These factors may include, but are not limited to: 1. The effectiveness of the local anesthetics used. 2. The effects of the analgesic(s) and/or anxiolytic(s) used. 3. The degree of discomfort experienced by the patient at the time of the procedure. 4. The patients ability and reliability in recalling and recording the events. 5. The presence and influence of possible secondary gains and/or psychosocial factors. Reported result: Relief experienced during the 1st hour after the procedure: 50 % (patient states that she when she left she continued to have pain but once at home that day she was pain free. ) (Ultra-Short Term Relief) Interpretative annotation: Analgesia during this period is likely to be Local Anesthetic and/or IV Sedative (Analgesic/Anxiolitic) related.          Effects of local anesthetic: The analgesic effects attained during this period are directly associated to the localized infiltration of local anesthetics and therefore cary significant diagnostic value as to the etiological location, or anatomical origin, of the pain. Expected duration of relief is directly dependent on the pharmacodynamics of the local anesthetic used. Long-acting (4-6 hours) anesthetics used.  Reported result: Relief during the next 4 to 6 hour after the procedure: 100 % (Short-Term Relief) Interpretative annotation: Complete relief would suggest area to be the source of the pain.          Long-term benefit: Defined as the period of time past the expected  duration of local anesthetics. With the possible exception of prolonged sympathetic blockade from the local anesthetics, benefits during this period are typically attributed to, or associated with, other factors such as analgesic sensory neuropraxia, antiinflammatory effects, or beneficial biochemical changes provided by agents other than the local anesthetics Reported result: Extended relief following procedure: 80 % (Long-Term Relief) Interpretative annotation: Good relief. Possible therapeutic success.          Current benefits: Defined as persistent relief that continues at this point in time.   Reported results: Treated area: 80 % [name] [name] reports improvement in function Interpretative annotation: Ongoing benefits would suggest effective therapeutic approach  Interpretation: Results would suggest a successful diagnostic intervention.          Laboratory Chemistry  Inflammation Markers Lab Results  Component Value Date   CRP 0.6 12/24/2015   ESRSEDRATE 46 (H) 02/20/2016   (CRP: Acute Phase) (ESR: Chronic Phase) Renal Function Markers Lab Results  Component Value Date   BUN 11 02/20/2016   CREATININE 0.49 02/20/2016   GFRAA >60 02/20/2016   GFRNONAA >60 02/20/2016   Hepatic Function Markers Lab Results  Component Value Date   AST 19 02/20/2016   ALT 16 02/20/2016   ALBUMIN 3.7 02/20/2016   ALKPHOS 67 02/20/2016   Electrolytes Lab Results  Component Value Date   NA 136  02/20/2016   K 3.8 02/20/2016   CL 102 02/20/2016   CALCIUM 8.8 (L) 02/20/2016   MG 1.8 12/24/2015   Neuropathy Markers Lab Results  Component Value Date   VITAMINB12 287 12/24/2015   Bone Pathology Markers Lab Results  Component Value Date   ALKPHOS 67 02/20/2016   25OHVITD1 39 12/24/2015   25OHVITD2 27 12/24/2015   25OHVITD3 12 12/24/2015   CALCIUM 8.8 (L) 02/20/2016   Coagulation Parameters Lab Results  Component Value Date   PLT 235 02/20/2016   Cardiovascular Markers Lab Results   Component Value Date   BNP 25.0 12/24/2015   HGB 12.8 02/20/2016   HCT 37.9 02/20/2016   Note: Lab results reviewed.  Recent Diagnostic Imaging Review  Dg Hip Unilat With Pelvis 2-3 Views Right Result Date: 02/20/2016 CLINICAL DATA:  56 y/o F; right hip pain started yesterday and has continued to worsen. EXAM: DG HIP (WITH OR WITHOUT PELVIS) 2-3V RIGHT COMPARISON:  None. FINDINGS: No acute fracture or dislocation is identified. Minimal osteoarthrosis of the hip joints bilaterally with productive changes in superolateral acetabula. Right femur greater trochanter small enthesophyte. Sacral stimulator device projecting over left hemipelvis with electrode along the left sacral ala. Pelvic phleboliths. IMPRESSION: No acute fracture or dislocation identified. Minimal bilateral hip osteoarthrosis. Electronically Signed   By: Kristine Garbe M.D.   On: 02/20/2016 05:56   Note: Imaging results reviewed.          Meds  The patient has a current medication list which includes the following prescription(s): abatacept, atorvastatin, cholecalciferol, clotrimazole-betamethasone, famotidine, folic acid, hydroxychloroquine, hydroxyzine, insulin aspart, klor-con m20, levothyroxine, lisinopril, loperamide, lovastatin, magnesium oxide, melatonin, meloxicam, metformin, methotrexate, oxycodone, oxycodone, prednisone, pregabalin, promethazine, tamsulosin, vitamin d (ergocalciferol), buspirone, fluoxetine, oxycodone, and zolpidem.  Current Outpatient Prescriptions on File Prior to Visit  Medication Sig  . Abatacept (ORENCIA [place]) Inject into the vein every 30 (thirty) days. Every 2 weeks  . atorvastatin (LIPITOR) 80 MG tablet Take 80 mg by mouth daily.  . Cholecalciferol (VITAMIN D-1000 MAX ST) 1000 UNITS tablet Take by mouth.  . clotrimazole-betamethasone (LOTRISONE) cream Apply 1 application topically 2 (two) times daily. For yeast infection under stomach  . famotidine (PEPCID) 20 MG tablet Take 20 mg by  mouth 2 (two) times daily.   . folic acid (FOLVITE) 1 MG tablet Take two tablets to make 68m.  . hydroxychloroquine (PLAQUENIL) 200 MG tablet Take 200 mg by mouth 2 (two) times daily.  . hydrOXYzine (ATARAX/VISTARIL) 50 MG tablet Take 50 mg by mouth Twice daily.   . Insulin Aspart (NOVOLOG [place]) Inject 10 Units into the skin 2 (two) times daily. Injection twice daily  . KLOR-CON M20 20 MEQ tablet TAKE ONE TABLET BY MOUTH TWICE DAILY (Patient taking differently: take 2 tabs in the a.m. , 1 tab in the evening)  . levothyroxine (SYNTHROID, LEVOTHROID) 88 MCG tablet Take 88 mcg by mouth daily before breakfast.  . lisinopril (PRINIVIL,ZESTRIL) 2.5 MG tablet Take 2.5 mg by mouth daily.  .Marland Kitchenlovastatin (MEVACOR) 40 MG tablet Take 40 mg by mouth.  . Magnesium Oxide 500 MG CAPS Take 1 capsule (500 mg total) by mouth 2 (two) times daily at 8 am and 10 pm.  . Melatonin 10 MG CAPS Take 20 mg by mouth at bedtime as needed.  . meloxicam (MOBIC) 7.5 MG tablet Take 1 tablet (7.5 mg total) by mouth 2 (two) times daily after a meal.  . methotrexate 50 MG/2ML injection Inject 1 mL into the muscle once a  week.  . oxyCODONE (OXY IR/ROXICODONE) 5 MG immediate release tablet Take 1 tablet (5 mg total) by mouth every 6 (six) hours as needed for severe pain.  Derrill Memo ON 07/29/2016] oxyCODONE (OXY IR/ROXICODONE) 5 MG immediate release tablet Take 1 tablet (5 mg total) by mouth every 6 (six) hours as needed for severe pain.  . predniSONE (DELTASONE) 10 MG tablet Take 20-25 mg by mouth daily as needed (Pain scale  Used to determine dosage). Reported on 08/29/2015  . promethazine (PHENERGAN) 25 MG tablet Take 25 mg by mouth every 6 (six) hours as needed. For nausea  . tamsulosin (FLOMAX) 0.4 MG CAPS Take 0.4 mg by mouth daily.  . Vitamin D, Ergocalciferol, (DRISDOL) 50000 units CAPS capsule Take by mouth.  . busPIRone (BUSPAR) 10 MG tablet Take 1 tablet (10 mg total) by mouth 2 (two) times daily.  Marland Kitchen FLUoxetine (PROZAC) 20 MG  capsule Take 2 capsules (40 mg total) by mouth daily.  Marland Kitchen oxyCODONE (OXY IR/ROXICODONE) 5 MG immediate release tablet Take 1 tablet (5 mg total) by mouth every 6 (six) hours as needed for severe pain.  Marland Kitchen zolpidem (AMBIEN) 5 MG tablet Take 1 tablet (5 mg total) by mouth at bedtime.   No current facility-administered medications on file prior to visit.    ROS  Constitutional: Denies any fever or chills Gastrointestinal: No reported hemesis, hematochezia, vomiting, or acute GI distress Musculoskeletal: Denies any acute onset joint swelling, redness, loss of ROM, or weakness Neurological: No reported episodes of acute onset apraxia, aphasia, dysarthria, agnosia, amnesia, paralysis, loss of coordination, or loss of consciousness  Allergies  [name] [name] is allergic to codeine; propoxyphene; propoxyphene; sulfa antibiotics; hydrocodone; and sulfasalazine.  PFSH  Drug: [name] [name]  reports that she does not use drugs. Alcohol:  reports that she does not drink alcohol. Tobacco:  reports that she quit smoking about 16 years ago. Her smoking use included Cigarettes. She has a 54.00 pack-year smoking history. She has never used smokeless tobacco. Medical:  has a past medical history of Abdominal wall hernia (01/29/2013); Anxiety; Arthritis; C. difficile colitis; Chronic diastolic heart failure ([place]); Depression; Diabetes mellitus; Diarrhea (10/22/2015); Diastolic CHF ([place]); Esophagitis; Fluid retention; GERD (gastroesophageal reflux disease); Hiatal hernia; Hypertension; Hypokalemia due to loss of potassium (10/21/2015); Hypothyroidism; IBS (irritable bowel syndrome); Moderate episode of recurrent major depressive disorder ([place]) (06/03/2004); Morbid obesity ([place]); Nausea & vomiting (08/04/2011); Neurogenic bladder; Neuropathy ([place]); Obesity; Panic attacks; Rheumatoid arthritis ([place]); and Sleep apnea. Family: family history includes Alcohol abuse in her father and sister; Anxiety disorder in her father, sister, and  sister; Bipolar disorder in her father and sister; Depression in her father, sister, and sister; Drug abuse in her sister; Heart attack in her brother; Heart attack (age of onset: 70) in her brother; Heart disease in her brother; Heart failure in her father.  Past Surgical History:  Procedure Laterality Date  . ABDOMINAL HYSTERECTOMY    . CHOLECYSTECTOMY    . EYE SURGERY     bilateral cataract extraction with IOL  . HERNIA REPAIR     ventral hernia with strangulation  . LAPAROSCOPIC GASTRIC BANDING  03/20/07  . TONSILLECTOMY    . TUBAL LIGATION     Constitutional Exam  General appearance: Well nourished, well developed, and well hydrated. In no apparent acute distress Vitals:   07/01/16 0813  BP: 126/86  Pulse: (!) 58  Resp: 14  Temp: 98.6 F (37 C)  TempSrc: Oral  SpO2: 97%  Weight: (!) 305 lb (138.3  kg)  Height: 5' 6" (1.676 m)   BMI Assessment: Estimated body mass index is 49.23 kg/m as calculated from the following:   Height as of this encounter: 5' 6" (1.676 m).   Weight as of this encounter: 305 lb (138.3 kg).  BMI interpretation table: BMI level Category Range association with higher incidence of chronic pain  <18 kg/m2 Underweight   18.5-24.9 kg/m2 Ideal body weight   25-29.9 kg/m2 Overweight Increased incidence by 20%  30-34.9 kg/m2 Obese (Class I) Increased incidence by 68%  35-39.9 kg/m2 Severe obesity (Class II) Increased incidence by 136%  >40 kg/m2 Extreme obesity (Class III) Increased incidence by 254%   BMI Readings from Last 4 Encounters:  07/01/16 49.23 kg/m  04/28/16 48.10 kg/m  04/21/16 46.81 kg/m  03/31/16 46.81 kg/m   Wt Readings from Last 4 Encounters:  07/01/16 (!) 305 lb (138.3 kg)  04/28/16 298 lb (135.2 kg)  04/21/16 290 lb (131.5 kg)  03/31/16 290 lb (131.5 kg)  Psych/Mental status: Alert, oriented x 3 (person, place, & time)       Eyes: PERLA Respiratory: No evidence of acute respiratory distress  Cervical Spine Exam   Inspection: No masses, redness, or swelling Alignment: Symmetrical Functional ROM: Unrestricted ROM Stability: No instability detected Muscle strength & Tone: Functionally intact Sensory: Unimpaired Palpation: Non-contributory  Upper Extremity (UE) Exam    Side: Right upper extremity  Side: Left upper extremity  Inspection: No masses, redness, swelling, or asymmetry. No contractures  Inspection: No masses, redness, swelling, or asymmetry. No contractures  Functional ROM: Unrestricted ROM          Functional ROM: Unrestricted ROM          Muscle strength & Tone: Functionally intact  Muscle strength & Tone: Functionally intact  Sensory: Unimpaired  Sensory: Unimpaired  Palpation: Euthermic  Palpation: Euthermic  Specialized Test(s): Deferred         Specialized Test(s): Deferred          Thoracic Spine Exam  Inspection: No masses, redness, or swelling Alignment: Symmetrical Functional ROM: Unrestricted ROM Stability: No instability detected Sensory: Unimpaired Muscle strength & Tone: Functionally intact Palpation: Non-contributory  Lumbar Spine Exam  Inspection: No masses, redness, or swelling Alignment: Symmetrical Functional ROM: Unrestricted ROM Stability: No instability detected Muscle strength & Tone: Functionally intact Sensory: Unimpaired Palpation: Non-contributory Provocative Tests: Lumbar Hyperextension and rotation test: evaluation deferred today       Patrick's Maneuver: evaluation deferred today              Gait & Posture Assessment  Ambulation: Unassisted Gait: Modified gait pattern (slower gait speed, wider stride width, and longer stance duration) associated with morbid obesity Posture: WNL   Lower Extremity Exam    Side: Right lower extremity  Side: Left lower extremity  Inspection: No masses, redness, swelling, or asymmetry. No contractures  Inspection: No masses, redness, swelling, or asymmetry. No contractures  Functional ROM: Unrestricted ROM           Functional ROM: Unrestricted ROM          Muscle strength & Tone: Functionally intact  Muscle strength & Tone: Functionally intact  Sensory: Unimpaired  Sensory: Unimpaired  Palpation: No palpable anomalies  Palpation: No palpable anomalies   Assessment  Primary Diagnosis & Pertinent Problem List: The primary encounter diagnosis was Chronic pain syndrome. Diagnoses of Chronic foot pain (Location of Primary Source of Pain) (Bilateral) (L>R), Chronic wrist pain (Location of Secondary source of pain) (Bilateral) (L>R), Chronic elbow  pain (Location of Tertiary source of pain) (Bilateral) (L>R), Neuropathic pain, Neurogenic pain, Primary osteoarthritis involving multiple joints, Long term prescription opiate use, Opiate use (30 MME/Day), and Diabetic peripheral neuropathy (HCC) were also pertinent to this visit.  Status Diagnosis  Controlled Controlled Controlled 1. Chronic pain syndrome   2. Chronic foot pain (Location of Primary Source of Pain) (Bilateral) (L>R)   3. Chronic wrist pain (Location of Secondary source of pain) (Bilateral) (L>R)   4. Chronic elbow pain (Location of Tertiary source of pain) (Bilateral) (L>R)   5. Neuropathic pain   6. Neurogenic pain   7. Primary osteoarthritis involving multiple joints   8. Long term prescription opiate use   9. Opiate use (30 MME/Day)   10. Diabetic peripheral neuropathy (HCC)      Plan of Care  Pharmacotherapy (Medications Ordered): Meds ordered this encounter  Medications  . pregabalin (LYRICA) 150 MG capsule    Sig: Take 1 capsule (150 mg total) by mouth 3 (three) times daily.    Dispense:  270 capsule    Refill:  0    Do not add this medication to the electronic "Automatic Refill" notification system. Patient may have prescription filled one day early if pharmacy is closed on scheduled refill date.   New Prescriptions   No medications on file   Medications administered today: [name] [name] had no medications administered during this  visit. Lab-work, procedure(s), and/or referral(s): Orders Placed This Encounter  Procedures  . LIDOCAINE INFUSION  . ToxASSURE Select 13 (MW), Urine   Imaging and/or referral(s): None  Interventional therapies: Planned, scheduled, and/or pending:   Not at this time.   Considering:   IV lidocaine infusions.  Diagnostic bilateral lumbar sympathetic block  Intra-articular injection with local anesthetic and steroids.  Diagnostic bilateral intra-articular shoulder joint injection  Diagnostic bilateral intra-articular knee injections with local anesthetic and steroid   Diagnostic right-sided cervical epidural steroid injections  Diagnostic bilateral Cervicalfacet block  Possible bilateral cervical facet radiofrequency ablation.    Palliative PRN treatment(s):   Palliative IV lidocaine infusion   Provider-requested follow-up: Return for keep scheduled Med-Mgmt appointment, in addition, (PRN) procedure.  Future Appointments Date Time Provider [place]  12/30/2016 1:00 PM Gonzella Lex, MD [facility] None   Primary Care Physician: [facility] Location: [facility] Note by: Kathlen Brunswick. Dossie Arbour, M.D, DABA, DABAPM, DABPM, DABIPP, FIPP Date: 07/01/2016; Time: 3:29 PM  Pain Score Disclaimer: We use the NRS-11 scale. This is a self-reported, subjective measurement of pain severity with only modest accuracy. It is used primarily to identify changes within a particular patient. It must be understood that outpatient pain scales are significantly less accurate that those used for research, where they can be applied under ideal controlled circumstances with minimal exposure to variables. In reality, the score is likely to be a combination of pain intensity and pain affect, where pain affect describes the degree of emotional arousal or changes in action readiness caused by the sensory experience of pain. Factors such as social and work  situation, setting, emotional state, anxiety levels, expectation, and prior pain experience may influence pain perception and show large inter-individual differences that may also be affected by time variables.  Patient instructions provided during this appointment: Patient Instructions   Pain Score  Introduction: The pain score used by this practice is the Verbal Numerical Rating Scale (VNRS-11). This is an 11-point scale. It is for adults and children 10 years or older. There are significant differences  in how the pain score is reported, used, and applied. Forget everything you learned in the past and learn this scoring system.  General Information: The scale should reflect your current level of pain. Unless you are specifically asked for the level of your worst pain, or your average pain. If you are asked for one of these two, then it should be understood that it is over the past 24 hours.  Basic Activities of Daily Living (ADL): Personal hygiene, dressing, eating, transferring, and using restroom.  Instructions: Most patients tend to report their level of pain as a combination of two factors, their physical pain and their psychosocial pain. This last one is also known as "suffering" and it is reflection of how physical pain affects you socially and psychologically. From now on, report them separately. From this point on, when asked to report your pain level, report only your physical pain. Use the following table for reference.  Pain Clinic Pain Levels (0-5/10)  Pain Level Score Description  No Pain 0   Mild pain 1 Nagging, annoying, but does not interfere with basic activities of daily living (ADL). Patients are able to eat, bathe, get dressed, toileting (being able to get on and off the toilet and perform personal hygiene functions), transfer (move in and out of bed or a chair without assistance), and maintain continence (able to control bladder and bowel functions). Blood pressure and heart  rate are unaffected. A normal heart rate for a healthy adult ranges from 60 to 100 bpm (beats per minute).   Mild to moderate pain 2 Noticeable and distracting. Impossible to hide from other people. More frequent flare-ups. Still possible to adapt and function close to normal. It can be very annoying and may have occasional stronger flare-ups. With discipline, patients may get used to it and adapt.   Moderate pain 3 Interferes significantly with activities of daily living (ADL). It becomes difficult to feed, bathe, get dressed, get on and off the toilet or to perform personal hygiene functions. Difficult to get in and out of bed or a chair without assistance. Very distracting. With effort, it can be ignored when deeply involved in activities.   Moderately severe pain 4 Impossible to ignore for more than a few minutes. With effort, patients may still be able to manage work or participate in some social activities. Very difficult to concentrate. Signs of autonomic nervous system discharge are evident: dilated pupils (mydriasis); mild sweating (diaphoresis); sleep interference. Heart rate becomes elevated (>115 bpm). Diastolic blood pressure (lower number) rises above 100 mmHg. Patients find relief in laying down and not moving.   Severe pain 5 Intense and extremely unpleasant. Associated with frowning face and frequent crying. Pain overwhelms the senses.  Ability to do any activity or maintain social relationships becomes significantly limited. Conversation becomes difficult. Pacing back and forth is common, as getting into a comfortable position is nearly impossible. Pain wakes you up from deep sleep. Physical signs will be obvious: pupillary dilation; increased sweating; goosebumps; brisk reflexes; cold, clammy hands and feet; nausea, vomiting or dry heaves; loss of appetite; significant sleep disturbance with inability to fall asleep or to remain asleep. When persistent, significant weight loss is observed  due to the complete loss of appetite and sleep deprivation.  Blood pressure and heart rate becomes significantly elevated. Caution: If elevated blood pressure triggers a pounding headache associated with blurred vision, then the patient should immediately seek attention at an urgent or emergency care unit, as these may be signs  of an impending stroke.    Emergency Department Pain Levels (6-10/10)  Emergency Room Pain 6 Severely limiting. Requires emergency care and should not be seen or managed at an outpatient pain management facility. Communication becomes difficult and requires great effort. Assistance to reach the emergency department may be required. Facial flushing and profuse sweating along with potentially dangerous increases in heart rate and blood pressure will be evident.   Distressing pain 7 Self-care is very difficult. Assistance is required to transport, or use restroom. Assistance to reach the emergency department will be required. Tasks requiring coordination, such as bathing and getting dressed become very difficult.   Disabling pain 8 Self-care is no longer possible. At this level, pain is disabling. The individual is unable to do even the most "basic" activities such as walking, eating, bathing, dressing, transferring to a bed, or toileting. Fine motor skills are lost. It is difficult to think clearly.   Incapacitating pain 9 Pain becomes incapacitating. Thought processing is no longer possible. Difficult to remember your own name. Control of movement and coordination are lost.   The worst pain imaginable 10 At this level, most patients pass out from pain. When this level is reached, collapse of the autonomic nervous system occurs, leading to a sudden drop in blood pressure and heart rate. This in turn results in a temporary and dramatic drop in blood flow to the brain, leading to a loss of consciousness. Fainting is one of the body's self defense mechanisms. Passing out puts the  brain in a calmed state and causes it to shut down for a while, in order to begin the healing process.    Summary: 1. Refer to this scale when providing [name] with your pain level. 2. Be accurate and careful when reporting your pain level. This will help with your care. 3. Over-reporting your pain level will lead to loss of credibility. 4. Even a level of 1/10 means that there is pain and will be treated at our facility. 5. High, inaccurate reporting will be documented as "Symptom Exaggeration", leading to loss of credibility and suspicions of possible secondary gains such as obtaining more narcotics, or wanting to appear disabled, for fraudulent reasons. 6. Only pain levels of 5 or below will be seen at our facility. 7. Pain levels of 6 and above will be sent to the Emergency Department and the appointment cancelled. _____________________________________________________________________________________________

## 2016-07-04 NOTE — Progress Notes (Signed)
Patient seen for follow-up for anxiety and depression. Mood is better. Not depressed. On the other hand she is feeling a little more anxious and jittery. Sleeping adequately. No suicidal ideation or homicidal ideation. No hallucinations. No sign whatsoever of mania. Not having racing thoughts or agitation.  Neatly dressed and groomed. Good eye contact. Normal psychomotor activity. Thoughts lucid mood slightly blunted.  She is requesting increase in Prozac dose to 40 mg a day because of a perceived increased anxiety off of it. Risk of mania appears to be low. Increase dose of Prozac continue other medicines. Follow-up 6 months. Patient agrees to plan.

## 2016-07-05 LAB — TOXASSURE SELECT 13 (MW), URINE

## 2016-07-13 ENCOUNTER — Telehealth: Payer: Self-pay | Admitting: *Deleted

## 2016-07-13 NOTE — Telephone Encounter (Signed)
Spoke with patient re; Lyrica and length of time she has been taking.  Also if she has ever taken gabapentin prior to taking lyrica.  Patient states she was on Gabapentin for 2 years and received no relief with this medication.  Patient has been taking Lyrica for approximately 1 year. PA sent for Lyrica 150 mg.

## 2016-08-03 NOTE — Progress Notes (Signed)
NOTE: This forensic urine drug screen (UDS) test was conducted using a state-of-the-art ultra high performance liquid chromatography and mass spectrometry system (UPLC/MS-MS), the most sophisticated and accurate method available. UPLC/MS-MS is 1,000 times more precise and accurate than standard gas chromatography and mass spectrometry (GC/MS). This system can analyze 26 drug categories and 180 drug compounds.  Unreported substance: Oxycodone  The findings of this UDT were reported as abnormal due to inconsistencies with expected results. An unreported substance was identified in the sample. Expectations were based on the medication history provided by the patient at the time of sample collection.  These results may suggest one of the following possibilities:  1). The use of multiple providers, suggesting the illegal practice of "Doctor Shopping", in violation of [place] Statutes, as well as our medication agreement.  2). The use of unsanctioned and possibly illegal substances, in violation of  Statutes, as well as our medication agreement. 3). Inaccurate list of reported substances.

## 2016-08-13 ENCOUNTER — Telehealth: Payer: Self-pay | Admitting: Pain Medicine

## 2016-08-13 NOTE — Telephone Encounter (Signed)
Patient lvmail about running out of Lyrica. I looked up meds and it says she has enough to last until 09-29-16. Patient is going to call pharmacy to check with them about refills.

## 2016-08-15 ENCOUNTER — Emergency Department
Admission: EM | Admit: 2016-08-15 | Discharge: 2016-08-15 | Disposition: A | Discharge status: Home / Self Care | Payer: Medicaid Other | Attending: Emergency Medicine | Admitting: Emergency Medicine

## 2016-08-15 ENCOUNTER — Encounter: Payer: Self-pay | Admitting: Emergency Medicine

## 2016-08-15 DIAGNOSIS — Z87891 Personal history of nicotine dependence: Secondary | ICD-10-CM | POA: Diagnosis not present

## 2016-08-15 DIAGNOSIS — Z794 Long term (current) use of insulin: Secondary | ICD-10-CM | POA: Diagnosis not present

## 2016-08-15 DIAGNOSIS — R197 Diarrhea, unspecified: Secondary | ICD-10-CM | POA: Insufficient documentation

## 2016-08-15 DIAGNOSIS — J449 Chronic obstructive pulmonary disease, unspecified: Secondary | ICD-10-CM | POA: Insufficient documentation

## 2016-08-15 DIAGNOSIS — R112 Nausea with vomiting, unspecified: Secondary | ICD-10-CM | POA: Insufficient documentation

## 2016-08-15 DIAGNOSIS — I110 Hypertensive heart disease with heart failure: Secondary | ICD-10-CM | POA: Insufficient documentation

## 2016-08-15 DIAGNOSIS — E119 Type 2 diabetes mellitus without complications: Secondary | ICD-10-CM | POA: Insufficient documentation

## 2016-08-15 DIAGNOSIS — R1084 Generalized abdominal pain: Secondary | ICD-10-CM

## 2016-08-15 DIAGNOSIS — I5032 Chronic diastolic (congestive) heart failure: Secondary | ICD-10-CM | POA: Diagnosis not present

## 2016-08-15 DIAGNOSIS — E039 Hypothyroidism, unspecified: Secondary | ICD-10-CM | POA: Diagnosis not present

## 2016-08-15 DIAGNOSIS — Z79899 Other long term (current) drug therapy: Secondary | ICD-10-CM | POA: Insufficient documentation

## 2016-08-15 LAB — CBC WITH DIFFERENTIAL/PLATELET
Basophils Absolute: 0.1 10*3/uL (ref 0–0.1)
Basophils Relative: 1 %
Eosinophils Absolute: 0 10*3/uL (ref 0–0.7)
Eosinophils Relative: 1 %
HCT: 39.4 % (ref 35.0–47.0)
Hemoglobin: 13.4 g/dL (ref 12.0–16.0)
Lymphocytes Relative: 15 %
Lymphs Abs: 1.3 10*3/uL (ref 1.0–3.6)
MCH: 26.7 pg (ref 26.0–34.0)
MCHC: 34 g/dL (ref 32.0–36.0)
MCV: 78.6 fL — ABNORMAL LOW (ref 80.0–100.0)
Monocytes Absolute: 0.3 10*3/uL (ref 0.2–0.9)
Monocytes Relative: 4 %
Neutro Abs: 7 10*3/uL — ABNORMAL HIGH (ref 1.4–6.5)
Neutrophils Relative %: 79 %
Platelets: 231 10*3/uL (ref 150–440)
RBC: 5.01 MIL/uL (ref 3.80–5.20)
RDW: 17.2 % — ABNORMAL HIGH (ref 11.5–14.5)
WBC: 8.7 10*3/uL (ref 3.6–11.0)

## 2016-08-15 LAB — COMPREHENSIVE METABOLIC PANEL
ALT: 14 U/L (ref 14–54)
AST: 21 U/L (ref 15–41)
Albumin: 3.6 g/dL (ref 3.5–5.0)
Alkaline Phosphatase: 70 U/L (ref 38–126)
Anion gap: 8 (ref 5–15)
BUN: 11 mg/dL (ref 6–20)
CO2: 26 mmol/L (ref 22–32)
Calcium: 9.2 mg/dL (ref 8.9–10.3)
Chloride: 104 mmol/L (ref 101–111)
Creatinine, Ser: 0.49 mg/dL (ref 0.44–1.00)
GFR calc Af Amer: >60 mL/min (ref >60)
GFR calc non Af Amer: >60 mL/min (ref >60)
Glucose, Bld: 149 mg/dL — ABNORMAL HIGH (ref 65–99)
Potassium: 3.4 mmol/L — ABNORMAL LOW (ref 3.5–5.1)
Sodium: 138 mmol/L (ref 135–145)
Total Bilirubin: 0.6 mg/dL (ref 0.3–1.2)
Total Protein: 7.2 g/dL (ref 6.5–8.1)

## 2016-08-15 LAB — TROPONIN I: Troponin I: <0.03 ng/mL (ref <0.03)

## 2016-08-15 LAB — LIPASE, BLOOD: Lipase: 18 U/L (ref 11–51)

## 2016-08-15 MED ORDER — PROMETHAZINE HCL 25 MG PO TABS: 25.0000 mg | ORAL_TABLET | Freq: Four times a day (QID) | ORAL | 0 refills | Status: DC | PRN

## 2016-08-15 MED ORDER — SODIUM CHLORIDE 0.9 % IV BOLUS (SEPSIS)
1000.0000 mL | Freq: Once | INTRAVENOUS | Status: AC
  Administered 2016-08-15: 1000 mL via INTRAVENOUS

## 2016-08-15 MED ORDER — PROMETHAZINE HCL 25 MG/ML IJ SOLN
12.5000 mg | Freq: Once | INTRAMUSCULAR | Status: AC
  Administered 2016-08-15: 12.5 mg via INTRAVENOUS
  Filled 2016-08-15: qty 1

## 2016-08-15 MED ORDER — PROMETHAZINE HCL 25 MG RE SUPP: 25.0000 mg | Freq: Four times a day (QID) | RECTAL | 0 refills | Status: DC | PRN

## 2016-08-15 MED ORDER — ONDANSETRON HCL 4 MG/2ML IJ SOLN
4.0000 mg | Freq: Once | INTRAMUSCULAR | Status: AC
  Administered 2016-08-15: 4 mg via INTRAVENOUS
  Filled 2016-08-15: qty 2

## 2016-08-15 NOTE — ED Provider Notes (Signed)
Vitals remain normal. Workup unremarkable. Patient feeling better after Phenergan. Sitting upright and wishes to go home. No bowel movements in the ED in the last 6 hours. Very low suspicion for C. difficile or infectious process in the intestines given no diarrhea here, normal vitals, no leukocytosis. Patient is well-appearing, no acute distress, suitable for outpatient follow-up. I'll refill her prescriptions for Phenergan. Follow-up with primary care.   Sharman Cheek, MD 08/15/16 [phone]

## 2016-08-15 NOTE — ED Notes (Signed)
Pt verbalized understanding of discharge instructions. NAD at this time. 

## 2016-08-15 NOTE — ED Triage Notes (Signed)
Pt arrived from home with c/o of diarrhea  x7 days and nausea x3 days. Pt also states generalized weakness. Pt informed MD that her diarrhea is chronic and that she has "spells" that cause her electrolytes to become imbalanced. Pt denies pain other than chronic arthritis pain.

## 2016-08-15 NOTE — ED Provider Notes (Addendum)
[facility] Emergency Department Provider Note  ____________________________________________   First MD Initiated Contact with Patient 08/15/16 1140     (approximate)  I have reviewed the triage vital signs and the nursing notes.   HISTORY  Chief Complaint Diarrhea and Nausea   HPI [name] [name] is a 56 y.o. female with a history of chronic diarrhea who is presenting to the emergency department 1 week of increased diarrhea as well as nausea and vomiting. She says that she has been vomiting clear liquid without a blood. Also with 4-5 bowel movements per day. No blood in the diarrhea. Denies any recent antibiotics. No evidence of contacts. Patient says that she has had C. difficile in the past month that the last time she had similar issue to this 6 months ago she was admitted to the ICU. She was admitted bedtime for hypokalemia. She says that she has diffuse body aches which is consistent with her chronic arthritis. Does not report any abdominal pain.   Past Medical History:  Diagnosis Date  . Abdominal wall hernia 01/29/2013  . Anxiety   . Arthritis    Rheumatoid  . C. difficile colitis   . Chronic diastolic heart failure (HCC)   . Depression   . Diabetes mellitus    states no meds or diet restrictions  at present  . Diarrhea 10/22/2015  . Diastolic CHF (HCC)   . Esophagitis   . Fluid retention   . GERD (gastroesophageal reflux disease)   . Hiatal hernia   . Hypertension   . Hypokalemia due to loss of potassium 10/21/2015   Overview:  Associated with 3 weeks of diarrhea  And QT prolongation.  . Hypothyroidism   . IBS (irritable bowel syndrome)   . Moderate episode of recurrent major depressive disorder (HCC) 06/03/2004  . Morbid obesity (HCC)   . Nausea & vomiting 08/04/2011  . Neurogenic bladder    has pacemaker  . Neuropathy   . Obesity   . Panic attacks   . Rheumatoid arthritis (HCC)   . Sleep apnea    STATES SEVERE, CANT TOLERATE MASK-  LAST STUDY YEARS AGO    Patient Active Problem List   Diagnosis Date Noted  . Chronic pain syndrome 03/31/2016  . Insomnia secondary to chronic pain 03/31/2016  . Chronic upper back pain 12/25/2015  . Chronic hand pain (Bilateral) (L>R) 12/25/2015  . Rheumatoid arthritis involving multiple joints (HCC) 12/25/2015  . Morbid obesity with BMI of 40.0-44.9, adult (HCC) 12/24/2015  . Osteoarthritis, multiple sites 12/24/2015  . Chronic foot pain (Location of Primary Source of Pain) (Bilateral) (L>R) 12/24/2015  . Chronic elbow pain (Location of Tertiary source of pain) (Bilateral) (L>R) 12/24/2015  . Chronic shoulder pain (Bilateral) (L>R) 12/24/2015  . Chronic neck pain (Bilateral) (R>L) 12/24/2015  . 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[employer]) 12/23/2015  . Chronic knee pain (Bilateral) (R>L) 12/23/2015  . Long term current use of opiate analgesic 12/23/2015  . Long term prescription opiate use 12/23/2015  . Opiate use (30 MME/Day) 12/23/2015  . Neurogenic pain 12/23/2015  . Neuropathic pain 12/23/2015  . Diabetic peripheral neuropathy (HCC) 12/23/2015  . Encounter for therapeutic drug level monitoring 12/23/2015  . Encounter for pain management planning 12/23/2015  . GERD (gastroesophageal reflux disease) 11/25/2015  . Hypokalemia 10/21/2015  . Hypomagnesemia 10/21/2015  . QT prolongation 10/21/2015  . Type 2 diabetes mellitus with hyperglycemia (HCC) 10/21/2015  . Chronic wrist pain (Location of Secondary source of pain) (Bilateral) (L>R) 03/19/2015  .  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 Female genuine stress incontinence 02/14/2015  . Urge incontinence of urine 02/14/2015  . Obstructive apnea 07/03/2014  . Chronic diastolic CHF (congestive heart failure), NYHA class 3 (HCC) 07/03/2014  . Anxiety 01/03/2014  . Diabetes mellitus (HCC) 01/03/2014  . COPD (chronic obstructive pulmonary disease) (HCC) 01/03/2014  . Bipolar affective disorder (HCC) 01/03/2014  .  Diastolic dysfunction 01/03/2014  . Combined fat and carbohydrate induced hyperlipemia 01/03/2014  . Shortness of breath 01/03/2014  . Incomplete bladder emptying 11/02/2012  . Bladder retention 10/10/2012  . Detrusor muscle hypertonia 10/04/2012  . Obstruction of urinary tract 10/04/2012  . FOM (frequency of micturition) 10/04/2012  . Vitamin D deficiency 04/15/2012  . Borderline personality disorder 01/06/2012  . Mixed anxiety depressive disorder 01/06/2012  . History of laparoscopic adjustable gastric banding, 03/20/2007.  Removed 09/19/2011. 08/04/2011  . Hypothyroidism 06/28/2010  . Essential (primary) hypertension 01/27/2005    Past Surgical History:  Procedure Laterality Date  . ABDOMINAL HYSTERECTOMY    . CHOLECYSTECTOMY    . EYE SURGERY     bilateral cataract extraction with IOL  . HERNIA REPAIR     ventral hernia with strangulation  . LAPAROSCOPIC GASTRIC BANDING  03/20/07  . TONSILLECTOMY    . TUBAL LIGATION      Prior to Admission medications   Medication Sig Start Date End Date Taking? Authorizing Provider  Abatacept (ORENCIA [place]) Inject into the vein every 30 (thirty) days.    Yes Historical Provider, MD  busPIRone (BUSPAR) 10 MG tablet Take 1 tablet (10 mg total) by mouth 2 (two) times daily. 07/01/16  Yes Audery Amel, MD  Cholecalciferol (VITAMIN D-1000 MAX ST) 1000 UNITS tablet Take 1,000 Units by mouth daily.    Yes Historical Provider, MD  clotrimazole-betamethasone (LOTRISONE) cream Apply 1 application topically 2 (two) times daily. For yeast infection under stomach 05/07/11  Yes Historical Provider, MD  famotidine (PEPCID) 20 MG tablet Take 20 mg by mouth daily.    Yes Historical Provider, MD  FLUoxetine (PROZAC) 20 MG capsule Take 2 capsules (40 mg total) by mouth daily. 07/01/16  Yes Audery Amel, MD  folic acid (FOLVITE) 1 MG tablet Take 1 mg by mouth daily.    Yes Historical Provider, MD  hydroxychloroquine (PLAQUENIL) 200 MG tablet Take 200 mg by mouth 2  (two) times daily.   Yes Historical Provider, MD  hydrOXYzine (ATARAX/VISTARIL) 50 MG tablet Take 50 mg by mouth Twice daily.  07/07/11  Yes Historical Provider, MD  Insulin Aspart (NOVOLOG [place]) Inject 10 Units into the skin 2 (two) times daily. Injection twice daily   Yes Historical Provider, MD  levothyroxine (SYNTHROID, LEVOTHROID) 88 MCG tablet Take 88 mcg by mouth daily before breakfast.   Yes Historical Provider, MD  lisinopril (PRINIVIL,ZESTRIL) 2.5 MG tablet Take 2.5 mg by mouth daily.   Yes Historical Provider, MD  loperamide (IMODIUM) 2 MG capsule Take 2 mg by mouth.  06/21/16  Yes Historical Provider, MD  lovastatin (MEVACOR) 40 MG tablet Take 40 mg by mouth daily.    Yes Historical Provider, MD  Magnesium Oxide 500 MG CAPS Take 1 capsule (500 mg total) by mouth 2 (two) times daily at 8 am and 10 pm. 05/30/16 08/28/16 Yes Delano Metz, MD  Melatonin 10 MG CAPS Take 20 mg by mouth at bedtime as needed. 05/30/16 08/28/16 Yes Delano Metz, MD  metFORMIN (GLUCOPHAGE) 500 MG tablet Take 500 mg by mouth 2 (two) times daily with a meal.  Yes Historical Provider, MD  methotrexate 50 MG/2ML injection Inject 1 mL into the muscle once a week.   Yes Historical Provider, MD  oxyCODONE (OXY IR/ROXICODONE) 5 MG immediate release tablet Take 1 tablet (5 mg total) by mouth every 6 (six) hours as needed for severe pain. 07/29/16 08/28/16 Yes Delano Metz, MD  predniSONE (DELTASONE) 10 MG tablet Take 20-25 mg by mouth daily as needed (Pain scale  Used to determine dosage). Reported on 08/29/2015   Yes Historical Provider, MD  pregabalin (LYRICA) 150 MG capsule Take 1 capsule (150 mg total) by mouth 3 (three) times daily. 07/01/16 09/29/16 Yes Delano Metz, MD  promethazine (PHENERGAN) 25 MG tablet Take 25 mg by mouth every 6 (six) hours as needed. For nausea   Yes Historical Provider, MD  tamsulosin (FLOMAX) 0.4 MG CAPS Take 0.4 mg by mouth daily.   Yes Historical Provider, MD  Vitamin D,  Ergocalciferol, (DRISDOL) 50000 units CAPS capsule Take 50,000 Units by mouth every 7 (seven) days.    Yes Historical Provider, MD  zolpidem (AMBIEN) 5 MG tablet Take 1 tablet (5 mg total) by mouth at bedtime. 07/01/16  Yes Audery Amel, MD  KLOR-CON M20 20 MEQ tablet TAKE ONE TABLET BY MOUTH TWICE DAILY Patient taking differently: take 2 tabs in the a.m. , 1 tab in the evening 04/10/13   [place] Ouch, MD  meloxicam (MOBIC) 7.5 MG tablet Take 1 tablet (7.5 mg total) by mouth 2 (two) times daily after a meal. Patient not taking: Reported on 08/15/2016 07/22/15   Tod [place]ia, MD  oxyCODONE (OXY IR/ROXICODONE) 5 MG immediate release tablet Take 1 tablet (5 mg total) by mouth every 6 (six) hours as needed for severe pain. 05/30/16 06/29/16  Delano Metz, MD  oxyCODONE (OXY IR/ROXICODONE) 5 MG immediate release tablet Take 1 tablet (5 mg total) by mouth every 6 (six) hours as needed for severe pain. 06/29/16 07/29/16  Delano Metz, MD    Allergies Codeine; Propoxyphene; Propoxyphene; Sulfa antibiotics; Hydrocodone; and Sulfasalazine  Family History  Problem Relation Age of Onset  . Heart failure Father   . Bipolar disorder Father   . Alcohol abuse Father   . Anxiety disorder Father   . Depression Father   . Heart disease Brother   . Heart attack Brother 50    MI s/p stents placed  . Anxiety disorder Sister   . Depression Sister   . Anxiety disorder Sister   . Depression Sister   . Bipolar disorder Sister   . Alcohol abuse Sister   . Drug abuse Sister   . Heart attack Brother     Social History Social History  Substance Use Topics  . Smoking status: Former Smoker    Packs/day: 2.00    Years: 27.00    Types: Cigarettes    Quit date: 07/30/1999  . Smokeless tobacco: Never Used  . Alcohol use No    Review of Systems  Constitutional: No fever/chills Eyes: No visual changes. ENT: No sore throat. Cardiovascular: Denies chest pain. Respiratory: Denies shortness of  breath. Gastrointestinal: No abdominal pain.   No constipation. Genitourinary: Negative for dysuria. Musculoskeletal: Negative for back pain. Skin: Negative for rash. Neurological: Negative for headaches, focal weakness or numbness.   ____________________________________________   PHYSICAL EXAM:  VITAL SIGNS: ED Triage Vitals  Enc Vitals Group     BP 08/15/16 1141 137/83     Pulse Rate 08/15/16 1141 64     Resp 08/15/16 1200 16  Temp --      Temp src --      SpO2 08/15/16 1133 97 %     Weight --      Height --      Head Circumference --      Peak Flow --      Pain Score --      Pain Loc --      Pain Edu? --      Excl. in GC? --     Constitutional: Alert and oriented. in no acute distress. Eyes: Conjunctivae are normal. PERRL. EOMI. Head: Atraumatic. Nose: No congestion/rhinnorhea. Mouth/Throat: Mucous membranes are moist.   Neck: No stridor.   Cardiovascular: Normal rate, regular rhythm. Grossly normal heart sounds.   Respiratory: Normal respiratory effort.  No retractions. Lungs CTAB. Gastrointestinal: Soft with mild diffuse tenderness palpation. No distention. Musculoskeletal: No lower extremity tenderness nor edema.  No joint effusions. Neurologic:  Normal speech and language. No gross focal neurologic deficits are appreciated. Skin:  Skin is warm, dry and intact. No rash noted. Psychiatric: Mood and affect are normal. Speech and behavior are normal.  ____________________________________________   LABS (all labs ordered are listed, but only abnormal results are displayed)  Labs Reviewed  CBC WITH DIFFERENTIAL/PLATELET - Abnormal; Notable for the following:       Result Value   MCV 78.6 (*)    RDW 17.2 (*)    Neutro Abs 7.0 (*)    All other components within normal limits  COMPREHENSIVE METABOLIC PANEL - Abnormal; Notable for the following:    Potassium 3.4 (*)    Glucose, Bld 149 (*)    All other components within normal limits  GASTROINTESTINAL  PANEL BY PCR, STOOL (REPLACES STOOL CULTURE)  C DIFFICILE QUICK SCREEN W PCR REFLEX  TROPONIN I  LIPASE, BLOOD  URINALYSIS, COMPLETE (UACMP) WITH MICROSCOPIC   ____________________________________________  EKG   ED ECG REPORT I, Arelia Longest, the attending physician, personally viewed and interpreted this ECG.   Date: 08/15/2016  EKG Time: 1146  Rate: 67  Rhythm: normal sinus rhythm  Axis: normal  Intervals:none  ST&T Change: No ST segment elevation or depression. No abnormal T-wave inversion.  ____________________________________________  RADIOLOGY   ____________________________________________   PROCEDURES  Procedure(s) performed:   Procedures  Critical Care performed:   ____________________________________________   INITIAL IMPRESSION / ASSESSMENT AND PLAN / ED COURSE  Pertinent labs & imaging results that were available during my care of the patient were reviewed by me and considered in my medical decision making (see chart for details).  ----------------------------------------- 250 PM on 08/15/2016 -----------------------------------------  Patient sitting on the side of her bed and tearful. Says that she is still very nauseous after Zofran and does not feel that she is ready to try anything by mouth this time. Says that she has also continued to vomit clear liquid. However, she has not had any episodes of diarrhea. We will try Phenergan at this time via the IV. Signed out to Dr. Scotty Court for reassessment. Patient's labs have proven reassuring with a normal white blood cell count as well as a very mildly low potassium level.       ____________________________________________   FINAL CLINICAL IMPRESSION(S) / ED DIAGNOSES  Abdominal pain. Nausea vomiting and diarrhea.    NEW MEDICATIONS STARTED DURING THIS VISIT:  New Prescriptions   No medications on file     Note:  This document was prepared using Dragon voice recognition software  and may include unintentional dictation  errors.    Myrna Blazer, MD 08/15/16 1537  Patient says that she has Phenergan for use at home and says that Zofran does not work.   Myrna Blazer, MD 08/15/16 [phone]

## 2016-08-17 ENCOUNTER — Ambulatory Visit: Payer: Medicaid Other | Admitting: Nurse Practitioner

## 2016-08-18 ENCOUNTER — Ambulatory Visit: Payer: Medicaid Other | Admitting: Nurse Practitioner

## 2016-08-19 ENCOUNTER — Encounter: Payer: Self-pay | Admitting: Nurse Practitioner

## 2016-08-19 ENCOUNTER — Ambulatory Visit: Payer: Medicaid Other | Attending: Nurse Practitioner | Admitting: Nurse Practitioner

## 2016-08-19 VITALS — BP 125/88 | HR 62 | Temp 98.9°F | Resp 16 | Ht 66.0 in | Wt 311.0 lb

## 2016-08-19 DIAGNOSIS — M069 Rheumatoid arthritis, unspecified: Secondary | ICD-10-CM | POA: Diagnosis not present

## 2016-08-19 DIAGNOSIS — E559 Vitamin D deficiency, unspecified: Secondary | ICD-10-CM | POA: Diagnosis not present

## 2016-08-19 DIAGNOSIS — Z6841 Body Mass Index (BMI) 40.0 and over, adult: Secondary | ICD-10-CM | POA: Insufficient documentation

## 2016-08-19 DIAGNOSIS — G8929 Other chronic pain: Secondary | ICD-10-CM

## 2016-08-19 DIAGNOSIS — K219 Gastro-esophageal reflux disease without esophagitis: Secondary | ICD-10-CM | POA: Diagnosis not present

## 2016-08-19 DIAGNOSIS — Z79899 Other long term (current) drug therapy: Secondary | ICD-10-CM | POA: Diagnosis not present

## 2016-08-19 DIAGNOSIS — M25531 Pain in right wrist: Secondary | ICD-10-CM | POA: Insufficient documentation

## 2016-08-19 DIAGNOSIS — J449 Chronic obstructive pulmonary disease, unspecified: Secondary | ICD-10-CM | POA: Insufficient documentation

## 2016-08-19 DIAGNOSIS — G894 Chronic pain syndrome: Secondary | ICD-10-CM

## 2016-08-19 DIAGNOSIS — Z888 Allergy status to other drugs, medicaments and biological substances status: Secondary | ICD-10-CM | POA: Insufficient documentation

## 2016-08-19 DIAGNOSIS — Z885 Allergy status to narcotic agent status: Secondary | ICD-10-CM | POA: Insufficient documentation

## 2016-08-19 DIAGNOSIS — F603 Borderline personality disorder: Secondary | ICD-10-CM | POA: Insufficient documentation

## 2016-08-19 DIAGNOSIS — M792 Neuralgia and neuritis, unspecified: Secondary | ICD-10-CM

## 2016-08-19 DIAGNOSIS — K589 Irritable bowel syndrome without diarrhea: Secondary | ICD-10-CM | POA: Diagnosis not present

## 2016-08-19 DIAGNOSIS — M25512 Pain in left shoulder: Secondary | ICD-10-CM | POA: Insufficient documentation

## 2016-08-19 DIAGNOSIS — M25532 Pain in left wrist: Secondary | ICD-10-CM

## 2016-08-19 DIAGNOSIS — M79641 Pain in right hand: Secondary | ICD-10-CM | POA: Insufficient documentation

## 2016-08-19 DIAGNOSIS — Z818 Family history of other mental and behavioral disorders: Secondary | ICD-10-CM | POA: Insufficient documentation

## 2016-08-19 DIAGNOSIS — Z79891 Long term (current) use of opiate analgesic: Secondary | ICD-10-CM | POA: Diagnosis not present

## 2016-08-19 DIAGNOSIS — E782 Mixed hyperlipidemia: Secondary | ICD-10-CM | POA: Insufficient documentation

## 2016-08-19 DIAGNOSIS — E039 Hypothyroidism, unspecified: Secondary | ICD-10-CM | POA: Diagnosis not present

## 2016-08-19 DIAGNOSIS — Z811 Family history of alcohol abuse and dependence: Secondary | ICD-10-CM | POA: Insufficient documentation

## 2016-08-19 DIAGNOSIS — I110 Hypertensive heart disease with heart failure: Secondary | ICD-10-CM | POA: Diagnosis not present

## 2016-08-19 DIAGNOSIS — Z87891 Personal history of nicotine dependence: Secondary | ICD-10-CM | POA: Diagnosis not present

## 2016-08-19 DIAGNOSIS — I5032 Chronic diastolic (congestive) heart failure: Secondary | ICD-10-CM | POA: Insufficient documentation

## 2016-08-19 DIAGNOSIS — Z882 Allergy status to sulfonamides status: Secondary | ICD-10-CM | POA: Insufficient documentation

## 2016-08-19 DIAGNOSIS — N319 Neuromuscular dysfunction of bladder, unspecified: Secondary | ICD-10-CM | POA: Insufficient documentation

## 2016-08-19 DIAGNOSIS — E1142 Type 2 diabetes mellitus with diabetic polyneuropathy: Secondary | ICD-10-CM | POA: Diagnosis not present

## 2016-08-19 DIAGNOSIS — Z794 Long term (current) use of insulin: Secondary | ICD-10-CM | POA: Insufficient documentation

## 2016-08-19 DIAGNOSIS — M1990 Unspecified osteoarthritis, unspecified site: Secondary | ICD-10-CM | POA: Insufficient documentation

## 2016-08-19 DIAGNOSIS — Z8249 Family history of ischemic heart disease and other diseases of the circulatory system: Secondary | ICD-10-CM | POA: Diagnosis not present

## 2016-08-19 DIAGNOSIS — Z9884 Bariatric surgery status: Secondary | ICD-10-CM | POA: Insufficient documentation

## 2016-08-19 DIAGNOSIS — M2550 Pain in unspecified joint: Secondary | ICD-10-CM | POA: Diagnosis present

## 2016-08-19 DIAGNOSIS — Z5181 Encounter for therapeutic drug level monitoring: Secondary | ICD-10-CM | POA: Insufficient documentation

## 2016-08-19 DIAGNOSIS — M25511 Pain in right shoulder: Secondary | ICD-10-CM | POA: Insufficient documentation

## 2016-08-19 DIAGNOSIS — F419 Anxiety disorder, unspecified: Secondary | ICD-10-CM | POA: Diagnosis not present

## 2016-08-19 DIAGNOSIS — G4730 Sleep apnea, unspecified: Secondary | ICD-10-CM | POA: Diagnosis not present

## 2016-08-19 DIAGNOSIS — Z813 Family history of other psychoactive substance abuse and dependence: Secondary | ICD-10-CM | POA: Insufficient documentation

## 2016-08-19 DIAGNOSIS — F319 Bipolar disorder, unspecified: Secondary | ICD-10-CM | POA: Insufficient documentation

## 2016-08-19 DIAGNOSIS — M25522 Pain in left elbow: Secondary | ICD-10-CM | POA: Insufficient documentation

## 2016-08-19 MED ORDER — PREGABALIN 150 MG PO CAPS: 150.0000 mg | ORAL_CAPSULE | Freq: Three times a day (TID) | ORAL | 0 refills | Status: DC

## 2016-08-19 MED ORDER — OXYCODONE HCL 5 MG PO TABS: 5.0000 mg | ORAL_TABLET | Freq: Four times a day (QID) | ORAL | 0 refills | Status: DC | PRN

## 2016-08-19 NOTE — Progress Notes (Signed)
Nursing Pain Medication Assessment:  Safety precautions to be maintained throughout the outpatient stay will include: orient to surroundings, keep bed in low position, maintain call bell within reach at all times, provide assistance with transfer out of bed and ambulation.  Medication Inspection Compliance: Pill count conducted under aseptic conditions, in front of the patient. Neither the pills nor the bottle was removed from the patient's sight at any time. Once count was completed pills were immediately returned to the patient in their original bottle.  Medication: See above Pill/Patch Count: 83 of 120 pills remain Pill/Patch Appearance: Markings consistent with prescribed medication Bottle Appearance: Standard pharmacy container. Clearly labeled. Filled Date: 04 / 24 / 2018 Last Medication intake:  Today

## 2016-08-19 NOTE — Progress Notes (Signed)
Patient's Name: [name] [name]  MRN: [id number]  Referring Provider: [place], [place]: Sep 22, 1960  PCP: [place]  DOS: 08/19/2016  Note by: Vevelyn Francois NP  Service setting: Ambulatory outpatient  Specialty: Interventional Pain Management  Location: [facility] (AMB) Pain Management Facility    Patient type: Established    Primary Reason(s) for Visit: Encounter for prescription drug management (Level of risk: moderate) CC: Joint Pain (rheumatoid arthritis)  HPI  [name] [name] is a 56 y.o. year old, female patient, who comes today for a medication management evaluation. She has History of laparoscopic adjustable gastric banding, 03/20/2007.  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; Shortness of breath; Obstruction of urinary tract; FOM (frequency of micturition); Urge incontinence of urine; Chronic wrist pain (Location of Secondary source of pain) (Bilateral) (L>R); Adhesive capsulitis; Borderline personality disorder; Chronic diastolic CHF (congestive heart failure), NYHA class 3 (HCC); GERD (gastroesophageal reflux disease); Hypokalemia; Hypomagnesemia; Mixed anxiety depressive disorder; QT prolongation; Type 2 diabetes mellitus with hyperglycemia ([place]); Vitamin D deficiency; 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[employer]); Chronic knee pain (Bilateral) (R>L); Long term current use of opiate analgesic; Long term prescription opiate use; Opiate use (30 MME/Day); Neurogenic pain; Neuropathic pain; Diabetic peripheral neuropathy ([place]); Encounter for therapeutic drug level monitoring; Encounter for pain management planning; Morbid obesity  with BMI of 40.0-44.9, adult ([place]); Osteoarthritis, multiple sites; Chronic foot pain (Location of Primary Source of Pain) (Bilateral) (L>R); Chronic elbow pain (Location of Tertiary source of pain) (Bilateral) (L>R); Chronic shoulder pain (Bilateral) (L>R); Chronic neck pain (Bilateral) (R>L); Chronic upper back pain; Chronic hand pain (Bilateral) (L>R); Rheumatoid arthritis involving multiple joints ([place]); Chronic pain syndrome; Insomnia secondary to chronic pain; and Hypothyroidism on her problem list. Her primarily concern today is the Joint Pain (rheumatoid arthritis)  Pain Assessment: Self-Reported Pain Score: 0-No pain/10             Reported level is compatible with observation.       Pain Type: Chronic pain Pain Location: Other (Comment) (generalized joint pain caused by RA) Pain Descriptors / Indicators: Sharp Pain Frequency: Intermittent  [name] [name] was last scheduled for an appointment on 07/01/16 for medication management. During today's appointment we reviewed [name] [name]'s chronic pain status, as well as her outpatient medication regimen. She has Rheumatoid arthritis worse in left wrist. She admits that this pain is stable. She is complaining of right upper arm pain. She admits that this is worse with specific movements. She feels like it is a tendon. She is followed by rheumatologist, on Abatacept .   The patient  reports that she does not use drugs. Her body mass index is 50.2 kg/m.  Further details on both, my assessment(s), as well as the proposed treatment plan, please see below.  Controlled Substance Pharmacotherapy Assessment REMS (Risk Evaluation and Mitigation Strategy)  Analgesic:Oxycodone IR5 mgone tablet by mouth every 6hours (72m/dayof oxycodone) MME/day:373mday   Patterson,[name] G, RN  08/19/2016  9:40 AM  Sign at close encounter Nursing Pain Medication Assessment:  Safety precautions to be maintained throughout the outpatient stay will include: orient to  surroundings, keep bed in low position, maintain call bell within reach at all times, provide assistance with transfer out of bed and ambulation.  Medication Inspection Compliance: Pill count conducted under aseptic conditions, in  front of the patient. Neither the pills nor the bottle was removed from the patient's sight at any time. Once count was completed pills were immediately returned to the patient in their original bottle.  Medication: See above Pill/Patch Count: 83 of 120 pills remain Pill/Patch Appearance: Markings consistent with prescribed medication Bottle Appearance: Standard pharmacy container. Clearly labeled. Filled Date: 04 / 24 / 2018 Last Medication intake:  Today   Pharmacokinetics: Liberation and absorption (onset of action): WNL Distribution (time to peak effect): WNL Metabolism and excretion (duration of action): WNL         Pharmacodynamics: Desired effects: Analgesia: Ms. Zanella reports >50% benefit. Functional ability: Patient reports that medication allows her to accomplish basic ADLs Clinically meaningful improvement in function (CMIF): Sustained CMIF goals met Perceived effectiveness: Described as relatively effective, allowing for increase in activities of daily living (ADL) Undesirable effects: Side-effects or Adverse reactions: None reported Monitoring: [place] PMP: Online review of the past 28-monthperiod conducted. Compliant with practice rules and regulations List of all UDS test(s) done:  Lab Results  Component Value Date   TOXASSSELUR FINAL 07/01/2016   TBradyFINAL 03/19/2015   SUMMARY FINAL 12/24/2015   Last UDS on record: [employer]ox[employer] Select 13  Date Value Ref Range Status  07/01/2016 FINAL  Final    Comment:    ==================================================================== TOXASSURE SELECT 13 (MW) ==================================================================== Test                             Result       Flag       Units Drug  Present not Declared for Prescription Verification   Oxycodone                      508          UNEXPECTED ng/mg creat   Oxymorphone                    92           UNEXPECTED ng/mg creat   Noroxycodone                   1711         UNEXPECTED ng/mg creat    Sources of oxycodone include scheduled prescription medications.    Oxymorphone and noroxycodone are expected metabolites of    oxycodone. Oxymorphone is also available as a scheduled    prescription medication. ==================================================================== Test                      Result    Flag   Units      Ref Range   Creatinine              84               mg/dL      >=20 ==================================================================== Declared Medications:  The flagging and interpretation on this report are based on the  following declared medications.  Unexpected results may arise from  inaccuracies in the declared medications.  **Note: The testing scope of this panel does not include following  reported medications:  Abatacept  Atorvastatin (Lipitor)  Buspirone (BuSpar)  Cholecalciferol  Clotrimazole (Lotrisone)  Famotidine (Pepcid)  Fluoxetine (Prozac)  Folic acid  Hydroxychloroquine (Plaquenil)  Hydroxyzine  Levothyroxine  Lisinopril  Loperamide  Lovastatin  Magnesium Oxide  Melatonin  Meloxicam  Metformin  Methotrexate  Potassium (Klor-Con)  Prednisone (Deltasone)  Pregabalin (Lyrica)  Promethazine (Phenergan)  Tamsulosin (Flomax)  Vitamin D2 (Drisdol)  Zolpidem (Ambien) ==================================================================== For clinical consultation, please call [phone]. ====================================================================    UDS interpretation: Compliant          Medication Assessment Form: Reviewed. Patient indicates being compliant with therapy Treatment compliance: Compliant Risk Assessment Profile: Aberrant behavior: See prior  evaluations. None observed or detected today Comorbid factors increasing risk of overdose: See prior notes. No additional risks detected today Risk of substance use disorder (SUD): Low Opioid Risk Tool (ORT) Total Score: 3  Interpretation Table:  Score <3 = Low Risk for SUD  Score between 4-7 = Moderate Risk for SUD  Score >8 = High Risk for Opioid Abuse   Risk Mitigation Strategies:  Patient Counseling: Covered Patient-Prescriber Agreement (PPA): Present and active  Notification to other healthcare providers: Done  Pharmacologic Plan: No change in therapy, at this time  Laboratory Chemistry  Inflammation Markers Lab Results  Component Value Date   CRP 0.6 12/24/2015   ESRSEDRATE 46 (H) 02/20/2016   (CRP: Acute Phase) (ESR: Chronic Phase) Renal Function Markers Lab Results  Component Value Date   BUN 11 08/15/2016   CREATININE 0.49 08/15/2016   GFRAA >60 08/15/2016   GFRNONAA >60 08/15/2016   Hepatic Function Markers Lab Results  Component Value Date   AST 21 08/15/2016   ALT 14 08/15/2016   ALBUMIN 3.6 08/15/2016   ALKPHOS 70 08/15/2016   Electrolytes Lab Results  Component Value Date   NA 138 08/15/2016   K 3.4 (L) 08/15/2016   CL 104 08/15/2016   CALCIUM 9.2 08/15/2016   MG 1.8 12/24/2015   Neuropathy Markers Lab Results  Component Value Date   VITAMINB12 287 12/24/2015   Bone Pathology Markers Lab Results  Component Value Date   ALKPHOS 70 08/15/2016   25OHVITD1 39 12/24/2015   25OHVITD2 27 12/24/2015   25OHVITD3 12 12/24/2015   CALCIUM 9.2 08/15/2016   Coagulation Parameters Lab Results  Component Value Date   PLT 231 08/15/2016   Cardiovascular Markers Lab Results  Component Value Date   BNP 25.0 12/24/2015   HGB 13.4 08/15/2016   HCT 39.4 08/15/2016   Note: Lab results reviewed.  Recent Diagnostic Imaging Review  No results found. Note: Imaging results reviewed.          Meds  The patient has a current medication list which  includes the following prescription(s): abatacept, buspirone, cholecalciferol, clotrimazole-betamethasone, famotidine, fluoxetine, folic acid, hydroxychloroquine, hydroxyzine, insulin aspart, klor-con m20, levothyroxine, lisinopril, loperamide, lovastatin, magnesium oxide, melatonin, metformin, methotrexate, oxycodone, prednisone, pregabalin, promethazine, promethazine, tamsulosin, vitamin d (ergocalciferol), zolpidem, oxycodone, and oxycodone.  Current Outpatient Prescriptions on File Prior to Visit  Medication Sig  . Abatacept (ORENCIA [place]) Inject into the vein every 30 (thirty) days.   . busPIRone (BUSPAR) 10 MG tablet Take 1 tablet (10 mg total) by mouth 2 (two) times daily.  . Cholecalciferol (VITAMIN D-1000 MAX ST) 1000 UNITS tablet Take 1,000 Units by mouth daily.   . clotrimazole-betamethasone (LOTRISONE) cream Apply 1 application topically 2 (two) times daily. For yeast infection under stomach  . famotidine (PEPCID) 20 MG tablet Take 20 mg by mouth daily.   Marland Kitchen FLUoxetine (PROZAC) 20 MG capsule Take 2 capsules (40 mg total) by mouth daily.  . folic acid (FOLVITE) 1 MG tablet Take 1 mg by mouth daily.   . hydroxychloroquine (PLAQUENIL) 200 MG tablet Take 200 mg by mouth 2 (two) times daily.  . hydrOXYzine (ATARAX/VISTARIL) 50  MG tablet Take 50 mg by mouth Twice daily.   . Insulin Aspart (NOVOLOG [place]) Inject 10 Units into the skin 2 (two) times daily. Injection twice daily  . KLOR-CON M20 20 MEQ tablet TAKE ONE TABLET BY MOUTH TWICE DAILY (Patient taking differently: take 2 tabs in the a.m. , 1 tab in the evening)  . levothyroxine (SYNTHROID, LEVOTHROID) 88 MCG tablet Take 88 mcg by mouth daily before breakfast.  . lisinopril (PRINIVIL,ZESTRIL) 2.5 MG tablet Take 2.5 mg by mouth daily.  Marland Kitchen loperamide (IMODIUM) 2 MG capsule Take 2 mg by mouth as needed.   . lovastatin (MEVACOR) 40 MG tablet Take 40 mg by mouth daily.   . Magnesium Oxide 500 MG CAPS Take 1 capsule (500 mg total) by mouth 2 (two)  times daily at 8 am and 10 pm.  . Melatonin 10 MG CAPS Take 20 mg by mouth at bedtime as needed.  . metFORMIN (GLUCOPHAGE) 500 MG tablet Take 500 mg by mouth 2 (two) times daily with a meal.   . methotrexate 50 MG/2ML injection Inject 1 mL into the muscle once a week.  . predniSONE (DELTASONE) 10 MG tablet Take 20-25 mg by mouth daily as needed (Pain scale  Used to determine dosage). Reported on 08/29/2015  . promethazine (PHENERGAN) 25 MG suppository Place 1 suppository (25 mg total) rectally every 6 (six) hours as needed for nausea.  . promethazine (PHENERGAN) 25 MG tablet Take 1 tablet (25 mg total) by mouth every 6 (six) hours as needed for nausea or vomiting.  . tamsulosin (FLOMAX) 0.4 MG CAPS Take 0.4 mg by mouth daily.  . Vitamin D, Ergocalciferol, (DRISDOL) 50000 units CAPS capsule Take 50,000 Units by mouth every 7 (seven) days.   Marland Kitchen zolpidem (AMBIEN) 5 MG tablet Take 1 tablet (5 mg total) by mouth at bedtime.   No current facility-administered medications on file prior to visit.    ROS  Constitutional: Denies any fever or chills Gastrointestinal: No reported hemesis, hematochezia, vomiting, or acute GI distress Musculoskeletal: Denies any acute onset joint swelling, redness, loss of ROM, or weakness Neurological: No reported episodes of acute onset apraxia, aphasia, dysarthria, agnosia, amnesia, paralysis, loss of coordination, or loss of consciousness  Allergies  Ms. Daluz is allergic to codeine; propoxyphene; propoxyphene; sulfa antibiotics; hydrocodone; and sulfasalazine.  PFSH  Drug: Ms. Horsfall  reports that she does not use drugs. Alcohol:  reports that she does not drink alcohol. Tobacco:  reports that she quit smoking about 17 years ago. Her smoking use included Cigarettes. She has a 54.00 pack-year smoking history. She has never used smokeless tobacco. Medical:  has a past medical history of Abdominal wall hernia (01/29/2013); Anxiety; Arthritis; C. difficile colitis; Chronic  diastolic heart failure ([place]); Depression; Diabetes mellitus; Diarrhea (10/22/2015); Diastolic CHF ([place]); Esophagitis; Fluid retention; GERD (gastroesophageal reflux disease); Hiatal hernia; Hypertension; Hypokalemia due to loss of potassium (10/21/2015); Hypothyroidism; IBS (irritable bowel syndrome); Moderate episode of recurrent major depressive disorder ([place]) (06/03/2004); Morbid obesity ([place]); Nausea & vomiting (08/04/2011); Neurogenic bladder; Neuropathy; Obesity; Panic attacks; Rheumatoid arthritis ([place]); and Sleep apnea. Family: family history includes Alcohol abuse in her father and sister; Anxiety disorder in her father, sister, and sister; Bipolar disorder in her father and sister; Depression in her father, sister, and sister; Drug abuse in her sister; Heart attack in her brother; Heart attack (age of onset: 34) in her brother; Heart disease in her brother; Heart failure in her father.  Past Surgical History:  Procedure Laterality Date  .  ABDOMINAL HYSTERECTOMY    . CHOLECYSTECTOMY    . EYE SURGERY     bilateral cataract extraction with IOL  . HERNIA REPAIR     ventral hernia with strangulation  . LAPAROSCOPIC GASTRIC BANDING  03/20/07  . TONSILLECTOMY    . TUBAL LIGATION     Constitutional Exam  General appearance: Well nourished, well developed, and well hydrated. In no apparent acute distress Vitals:   08/19/16 0926  BP: 125/88  Pulse: 62  Resp: 16  Temp: 98.9 F (37.2 C)  TempSrc: Oral  SpO2: 99%  Weight: (!) 311 lb (141.1 kg)  Height: 5' 6"  (1.676 m)   BMI Assessment: Estimated body mass index is 50.2 kg/m as calculated from the following:   Height as of this encounter: 5' 6"  (1.676 m).   Weight as of this encounter: 311 lb (141.1 kg).  BMI interpretation table: BMI level Category Range association with higher incidence of chronic pain  <18 kg/m2 Underweight   18.5-24.9 kg/m2 Ideal body weight   25-29.9 kg/m2 Overweight Increased incidence by 20%  30-34.9 kg/m2 Obese  (Class I) Increased incidence by 68%  35-39.9 kg/m2 Severe obesity (Class II) Increased incidence by 136%  >40 kg/m2 Extreme obesity (Class III) Increased incidence by 254%   BMI Readings from Last 4 Encounters:  08/19/16 50.20 kg/m  07/01/16 49.23 kg/m  04/28/16 48.10 kg/m  04/21/16 46.81 kg/m   Wt Readings from Last 4 Encounters:  08/19/16 (!) 311 lb (141.1 kg)  07/01/16 (!) 305 lb (138.3 kg)  04/28/16 298 lb (135.2 kg)  04/21/16 290 lb (131.5 kg)  Psych/Mental status: Alert, oriented x 3 (person, place, & time)       Eyes: PERLA Respiratory: No evidence of acute respiratory distress  Cervical Spine Exam  Inspection: No masses, redness, or swelling Alignment: Symmetrical Functional ROM: Unrestricted ROM      Stability: No instability detected Muscle strength & Tone: Functionally intact Sensory: Unimpaired Palpation: No palpable anomalies              Upper Extremity (UE) Exam    Side: Right upper extremity, upper arm  Side: Left upper extremity, wrist   Inspection: No masses, redness, swelling, or asymmetry. No contractures  Inspection: Edema, non pitting, no erythema  Functional ROM: Unrestricted ROM          Functional ROM: Decreased ROM          Muscle strength & Tone: Functionally intact  Muscle strength & Tone: Movement possible against some resistance (4/5)  Sensory: Unimpaired  Sensory: Unimpaired  Palpation: Complains of area being tender to palpation              Palpation: Uncomfortable              Specialized Test(s): Deferred         Specialized Test(s): Deferred          Thoracic Spine Exam  Inspection: No masses, redness, or swelling Alignment: Symmetrical Functional ROM: Unrestricted ROM Stability: No instability detected Sensory: Unimpaired Muscle strength & Tone: No palpable anomalies  Lumbar Spine Exam  Inspection: No masses, redness, or swelling Alignment: Symmetrical Functional ROM: Unrestricted ROM      Stability: No instability  detected Muscle strength & Tone: Functionally intact Sensory: Unimpaired Palpation: No palpable anomalies       Provocative Tests: Lumbar Hyperextension and rotation test: evaluation deferred today       Patrick's Maneuver: evaluation deferred today  Gait & Posture Assessment  Ambulation: Unassisted Gait: Relatively normal for age and body habitus Posture: WNL   Lower Extremity Exam    Side: Right lower extremity  Side: Left lower extremity  Inspection: No masses, redness, swelling, or asymmetry. No contractures  Inspection: No masses, redness, swelling, or asymmetry. No contractures  Functional ROM: Unrestricted ROM          Functional ROM: Unrestricted ROM          Muscle strength & Tone: Functionally intact  Muscle strength & Tone: Functionally intact  Sensory: Unimpaired  Sensory: Unimpaired  Palpation: No palpable anomalies  Palpation: No palpable anomalies   Assessment  Primary Diagnosis & Pertinent Problem List: The primary encounter diagnosis was Chronic wrist pain (Location of Secondary source of pain) (Bilateral) (L>R). Diagnoses of Neurogenic pain, Diabetic peripheral neuropathy ([place]), Chronic pain syndrome, Neuropathic pain, and Long term current use of opiate analgesic were also pertinent to this visit.  Status Diagnosis  Controlled Controlled Controlled 1. Chronic wrist pain (Location of Secondary source of pain) (Bilateral) (L>R)   2. Neurogenic pain   3. Diabetic peripheral neuropathy ([place])   4. Chronic pain syndrome   5. Neuropathic pain   6. Long term current use of opiate analgesic      Plan of Care  Pharmacotherapy (Medications Ordered): Meds ordered this encounter  Medications  . oxyCODONE (OXY IR/ROXICODONE) 5 MG immediate release tablet    Sig: Take 1 tablet (5 mg total) by mouth every 6 (six) hours as needed for severe pain.    Dispense:  120 tablet    Refill:  0    Do not place this medication, or any other prescription from our  practice, on "Automatic Refill". Patient may have prescription filled one day early if pharmacy is closed on scheduled refill date. Do not fill until: 08/28/16 To last until: 09/27/16    Order Specific Question:   Supervising Provider    Answer:   Milinda Pointer [phone]  . oxyCODONE (OXY IR/ROXICODONE) 5 MG immediate release tablet    Sig: Take 1 tablet (5 mg total) by mouth every 6 (six) hours as needed for severe pain.    Dispense:  120 tablet    Refill:  0    Do not place this medication, or any other prescription from our practice, on "Automatic Refill". Patient may have prescription filled one day early if pharmacy is closed on scheduled refill date. Do not fill until: 09/27/16 To last until: 10/27/16    Order Specific Question:   Supervising Provider    Answer:   Milinda Pointer [phone]  . oxyCODONE (OXY IR/ROXICODONE) 5 MG immediate release tablet    Sig: Take 1 tablet (5 mg total) by mouth every 6 (six) hours as needed for severe pain.    Dispense:  120 tablet    Refill:  0    Do not place this medication, or any other prescription from our practice, on "Automatic Refill". Patient may have prescription filled one day early if pharmacy is closed on scheduled refill date. Do not fill until: 10/27/16 To last until: 11/26/16    Order Specific Question:   Supervising Provider    Answer:   Milinda Pointer [phone]  . pregabalin (LYRICA) 150 MG capsule    Sig: Take 1 capsule (150 mg total) by mouth 3 (three) times daily.    Dispense:  270 capsule    Refill:  0    Do not add this medication to the electronic "Automatic  Refill" notification system. Patient may have prescription filled one day early if pharmacy is closed on scheduled refill date.    Order Specific Question:   Supervising Provider    Answer:   Milinda Pointer [[id number]]   New Prescriptions   No medications on file   Medications administered today: Ms. Colburn had no medications administered during this  visit. Lab-work, procedure(s), and/or referral(s): No orders of the defined types were placed in this encounter.  Imaging and/or referral(s): None  Interventional therapies: Planned, scheduled, and/or pending:   Not at this time.   Considering:   IV lidocaine infusions.  Diagnostic bilateral lumbar sympathetic block  Intra-articular injection with local anesthetic and steroids.  Diagnostic bilateral intra-articular shoulder joint injection  Diagnostic bilateral intra-articular knee injections with local anesthetic and steroid   Diagnostic right-sided cervical epidural steroid injections  Diagnostic bilateral Cervicalfacet block  Possible bilateral cervical facet radiofrequency ablation.    Palliative PRN treatment(s):   Palliative IV lidocaine infusion   Provider-requested follow-up: Return in about 3 months (around 11/19/2016) for Medication Mgmt.  Future Appointments Date Time Provider [place]  11/10/2016 10:00 AM Vevelyn Francois, NP [facility] None  12/30/2016 1:00 PM Gonzella Lex, MD [facility] None   Primary Care Physician: [facility] Location: [facility] Note by: Vevelyn Francois NP Date: 08/19/2016; Time: 10:29 AM  Pain Score Disclaimer: We use the NRS-11 scale. This is a self-reported, subjective measurement of pain severity with only modest accuracy. It is used primarily to identify changes within a particular patient. It must be understood that outpatient pain scales are significantly less accurate that those used for research, where they can be applied under ideal controlled circumstances with minimal exposure to variables. In reality, the score is likely to be a combination of pain intensity and pain affect, where pain affect describes the degree of emotional arousal or changes in action readiness caused by the sensory experience of pain. Factors such as social and work situation, setting, emotional state, anxiety  levels, expectation, and prior pain experience may influence pain perception and show large inter-individual differences that may also be affected by time variables.  Patient instructions provided during this appointment: There are no Patient Instructions on file for this visit.

## 2016-11-10 ENCOUNTER — Other Ambulatory Visit: Payer: Self-pay | Admitting: Psychiatry

## 2016-11-10 ENCOUNTER — Ambulatory Visit: Payer: Medicaid Other | Attending: Nurse Practitioner | Admitting: Nurse Practitioner

## 2016-11-10 ENCOUNTER — Encounter: Payer: Self-pay | Admitting: Nurse Practitioner

## 2016-11-10 VITALS — BP 141/88 | HR 62 | Temp 98.4°F | Resp 16 | Ht 66.0 in | Wt 318.0 lb

## 2016-11-10 DIAGNOSIS — M0579 Rheumatoid arthritis with rheumatoid factor of multiple sites without organ or systems involvement: Secondary | ICD-10-CM | POA: Insufficient documentation

## 2016-11-10 DIAGNOSIS — Z90710 Acquired absence of both cervix and uterus: Secondary | ICD-10-CM | POA: Insufficient documentation

## 2016-11-10 DIAGNOSIS — Z79899 Other long term (current) drug therapy: Secondary | ICD-10-CM | POA: Insufficient documentation

## 2016-11-10 DIAGNOSIS — Z9851 Tubal ligation status: Secondary | ICD-10-CM | POA: Insufficient documentation

## 2016-11-10 DIAGNOSIS — Z818 Family history of other mental and behavioral disorders: Secondary | ICD-10-CM | POA: Insufficient documentation

## 2016-11-10 DIAGNOSIS — M792 Neuralgia and neuritis, unspecified: Secondary | ICD-10-CM | POA: Diagnosis not present

## 2016-11-10 DIAGNOSIS — E1165 Type 2 diabetes mellitus with hyperglycemia: Secondary | ICD-10-CM | POA: Insufficient documentation

## 2016-11-10 DIAGNOSIS — M25531 Pain in right wrist: Secondary | ICD-10-CM | POA: Diagnosis not present

## 2016-11-10 DIAGNOSIS — M25512 Pain in left shoulder: Secondary | ICD-10-CM | POA: Insufficient documentation

## 2016-11-10 DIAGNOSIS — G8929 Other chronic pain: Secondary | ICD-10-CM

## 2016-11-10 DIAGNOSIS — M79641 Pain in right hand: Secondary | ICD-10-CM | POA: Diagnosis not present

## 2016-11-10 DIAGNOSIS — F331 Major depressive disorder, recurrent, moderate: Secondary | ICD-10-CM | POA: Insufficient documentation

## 2016-11-10 DIAGNOSIS — Z9889 Other specified postprocedural states: Secondary | ICD-10-CM | POA: Insufficient documentation

## 2016-11-10 DIAGNOSIS — K219 Gastro-esophageal reflux disease without esophagitis: Secondary | ICD-10-CM | POA: Insufficient documentation

## 2016-11-10 DIAGNOSIS — M542 Cervicalgia: Secondary | ICD-10-CM | POA: Diagnosis not present

## 2016-11-10 DIAGNOSIS — E1142 Type 2 diabetes mellitus with diabetic polyneuropathy: Secondary | ICD-10-CM

## 2016-11-10 DIAGNOSIS — J449 Chronic obstructive pulmonary disease, unspecified: Secondary | ICD-10-CM | POA: Insufficient documentation

## 2016-11-10 DIAGNOSIS — I5032 Chronic diastolic (congestive) heart failure: Secondary | ICD-10-CM | POA: Diagnosis not present

## 2016-11-10 DIAGNOSIS — Z6841 Body Mass Index (BMI) 40.0 and over, adult: Secondary | ICD-10-CM | POA: Diagnosis not present

## 2016-11-10 DIAGNOSIS — G894 Chronic pain syndrome: Secondary | ICD-10-CM | POA: Diagnosis not present

## 2016-11-10 DIAGNOSIS — Z8249 Family history of ischemic heart disease and other diseases of the circulatory system: Secondary | ICD-10-CM | POA: Insufficient documentation

## 2016-11-10 DIAGNOSIS — Z5181 Encounter for therapeutic drug level monitoring: Secondary | ICD-10-CM | POA: Insufficient documentation

## 2016-11-10 DIAGNOSIS — Z811 Family history of alcohol abuse and dependence: Secondary | ICD-10-CM | POA: Insufficient documentation

## 2016-11-10 DIAGNOSIS — I110 Hypertensive heart disease with heart failure: Secondary | ICD-10-CM | POA: Diagnosis not present

## 2016-11-10 DIAGNOSIS — G4701 Insomnia due to medical condition: Secondary | ICD-10-CM | POA: Diagnosis not present

## 2016-11-10 DIAGNOSIS — M25521 Pain in right elbow: Secondary | ICD-10-CM | POA: Insufficient documentation

## 2016-11-10 DIAGNOSIS — M25532 Pain in left wrist: Secondary | ICD-10-CM | POA: Diagnosis not present

## 2016-11-10 DIAGNOSIS — Z794 Long term (current) use of insulin: Secondary | ICD-10-CM | POA: Insufficient documentation

## 2016-11-10 DIAGNOSIS — M25529 Pain in unspecified elbow: Secondary | ICD-10-CM

## 2016-11-10 DIAGNOSIS — Z885 Allergy status to narcotic agent status: Secondary | ICD-10-CM | POA: Insufficient documentation

## 2016-11-10 DIAGNOSIS — Z87891 Personal history of nicotine dependence: Secondary | ICD-10-CM | POA: Insufficient documentation

## 2016-11-10 DIAGNOSIS — F419 Anxiety disorder, unspecified: Secondary | ICD-10-CM | POA: Insufficient documentation

## 2016-11-10 DIAGNOSIS — M79642 Pain in left hand: Secondary | ICD-10-CM | POA: Insufficient documentation

## 2016-11-10 DIAGNOSIS — M25522 Pain in left elbow: Secondary | ICD-10-CM | POA: Diagnosis not present

## 2016-11-10 DIAGNOSIS — M25511 Pain in right shoulder: Secondary | ICD-10-CM | POA: Diagnosis not present

## 2016-11-10 MED ORDER — PREGABALIN 150 MG PO CAPS: 150.0000 mg | ORAL_CAPSULE | Freq: Three times a day (TID) | ORAL | 0 refills | Status: DC

## 2016-11-10 MED ORDER — PROMETHAZINE HCL 25 MG PO TABS: 25.0000 mg | ORAL_TABLET | Freq: Four times a day (QID) | ORAL | 2 refills | Status: DC | PRN

## 2016-11-10 MED ORDER — OXYCODONE HCL 5 MG PO TABS: 5.0000 mg | ORAL_TABLET | Freq: Four times a day (QID) | ORAL | 0 refills | Status: DC | PRN

## 2016-11-10 NOTE — Progress Notes (Signed)
Patient's Name: [name] [name]  MRN: [id number]  Referring Provider: [place], [place]: December 08, 1960  PCP: [place], [place]: 11/10/2016  Note by: Vevelyn Francois NP  Service setting: Ambulatory outpatient  Specialty: Interventional Pain Management  Location: [facility] (AMB) Pain Management Facility    Patient type: Established    Primary Reason(s) for Visit: Encounter for prescription drug management. (Level of risk: moderate)  CC: Wrist Pain (left); Hand Pain (left); and Peripheral Neuropathy (pain related to)  HPI  [name] [name] is a 56 y.o. year old, female patient, who comes today for a medication management evaluation. She has History of laparoscopic adjustable gastric banding, 03/20/2007.  Removed 09/19/2011.; Anxiety; Diabetes mellitus ([place]); Chronic obstructive pulmonary disease ([place]); Bipolar affective disorder ([place]); Essential (primary) hypertension; Diastolic dysfunction; Major depressive disorder, recurrent episode, moderate ([place]); Incomplete bladder emptying; Obstructive apnea; Combined fat and carbohydrate induced hyperlipemia; Bladder retention; Detrusor muscle hypertonia; Female genuine stress incontinence; Shortness of breath; Obstruction of urinary tract; FOM (frequency of micturition); Urge incontinence of urine; Chronic wrist pain (Location of Secondary source of pain) (Bilateral) (L>R); Adhesive capsulitis; Borderline personality disorder; Chronic diastolic CHF (congestive heart failure), NYHA class 3 (HCC); GERD (gastroesophageal reflux disease); Hypokalemia; Hypomagnesemia; Mixed anxiety depressive disorder; QT prolongation; Type 2 diabetes mellitus with hyperglycemia ([place]); Vitamin D deficiency; 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[employer]); Chronic knee pain (Bilateral) (R>L); Long term current use of opiate analgesic; Long term prescription opiate use; Opiate use (30 MME/Day); Neurogenic pain; Neuropathic pain; Diabetic peripheral neuropathy  ([place]); Encounter for therapeutic drug level monitoring; Encounter for pain management planning; Morbid obesity with BMI of 40.0-44.9, adult ([place]); Osteoarthritis, multiple sites; Chronic foot pain (Location of Primary Source of Pain) (Bilateral) (L>R); Chronic elbow pain (Location of Tertiary source of pain) (Bilateral) (L>R); Chronic shoulder pain (Bilateral) (L>R); Chronic neck pain (Bilateral) (R>L); Chronic upper back pain; Chronic hand pain (Bilateral) (L>R); Rheumatoid arthritis ([place]); Chronic pain syndrome; Insomnia secondary to chronic pain; Hypothyroidism; and Neuropathy on her problem list. Her primarily concern today is the Wrist Pain (left); Hand Pain (left); and Peripheral Neuropathy (pain related to)  Pain Assessment: Location: Left Wrist Radiating: left hand Onset: More than a month ago Duration: Chronic pain Quality: Sharp, Constant Severity: 1 /10 (self-reported pain score)  Note: Reported level is compatible with observation.                   Effect on ADL:   Timing: Constant Modifying factors: medications, rest, not lifting or using left hand  [name] [name] was last scheduled for an appointment on 08/19/2016 for medication management. During today's appointment we reviewed [name] [name]'s chronic pain status, as well as her outpatient medication regimen. She states that her pain is stable. She deneis any side effects of the Oxycodone. She states that she has involuntary flicks or jerks of her wrist. She states that she has  The patient  reports that she does not use drugs. Her body mass index is 51.33 kg/m.  Further details on both, my assessment(s), as well as the proposed treatment plan, please see below.  Controlled Substance Pharmacotherapy Assessment REMS (Risk Evaluation and Mitigation Strategy)  Analgesic:Oxycodone IR5 mgone tablet by mouth every 6hours (24m/dayof oxycodone) MME/day:319mday   ShHart RochesterRN  11/10/2016 10:14 AM  Sign at close  encounter Nursing Pain Medication Assessment:  Safety precautions to be maintained throughout the outpatient stay will include: orient to surroundings, keep bed in low position, maintain call bell within reach at all  times, provide assistance with transfer out of bed and ambulation.  Medication Inspection Compliance: Pill count conducted under aseptic conditions, in front of the patient. Neither the pills nor the bottle was removed from the patient's sight at any time. Once count was completed pills were immediately returned to the patient in their original bottle.  Medication: Oxycodone IR Pill/Patch Count: 30 of 120 pills remain Pill/Patch Appearance: Markings consistent with prescribed medication Bottle Appearance: Standard pharmacy container. Clearly labeled. Filled Date: 07 / 01 / 2018 Last Medication intake:  Today   Pharmacokinetics: Liberation and absorption (onset of action): WNL Distribution (time to peak effect): WNL Metabolism and excretion (duration of action): WNL         Pharmacodynamics: Desired effects: Analgesia: [name] [name] reports >50% benefit. Functional ability: Patient reports that medication allows her to accomplish basic ADLs Clinically meaningful improvement in function (CMIF): Sustained CMIF goals met Perceived effectiveness: Described as relatively effective, allowing for increase in activities of daily living (ADL) Undesirable effects: Side-effects or Adverse reactions: None reported Monitoring: [place] PMP: Online review of the past 15-monthperiod conducted. Compliant with practice rules and regulations List of all UDS test(s) done:  Lab Results  Component Value Date   TOXASSSELUR FINAL 07/01/2016   T[place]FINAL 03/19/2015   SUMMARY FINAL 12/24/2015   Last UDS on record: ToxAssure Select 13  Date Value Ref Range Status  07/01/2016 FINAL  Final    Comment:    ==================================================================== TOXASSURE SELECT 13  (MW) ==================================================================== Test                             Result       Flag       Units Drug Present not Declared for Prescription Verification   Oxycodone                      508          UNEXPECTED ng/mg creat   Oxymorphone                    92           UNEXPECTED ng/mg creat   Noroxycodone                   1711         UNEXPECTED ng/mg creat    Sources of oxycodone include scheduled prescription medications.    Oxymorphone and noroxycodone are expected metabolites of    oxycodone. Oxymorphone is also available as a scheduled    prescription medication. ==================================================================== Test                      Result    Flag   Units      Ref Range   Creatinine              84               mg/dL      >=20 ==================================================================== Declared Medications:  The flagging and interpretation on this report are based on the  following declared medications.  Unexpected results may arise from  inaccuracies in the declared medications.  **Note: The testing scope of this panel does not include following  reported medications:  Abatacept  Atorvastatin (Lipitor)  Buspirone (BuSpar)  Cholecalciferol  Clotrimazole (Lotrisone)  Famotidine (Pepcid)  Fluoxetine (Prozac)  Folic acid  Hydroxychloroquine (Plaquenil)  Hydroxyzine  Levothyroxine  Lisinopril  Loperamide  Lovastatin  Magnesium Oxide  Melatonin  Meloxicam  Metformin  Methotrexate  Potassium (Klor-Con)  Prednisone (Deltasone)  Pregabalin (Lyrica)  Promethazine (Phenergan)  Tamsulosin (Flomax)  Vitamin D2 (Drisdol)  Zolpidem (Ambien) ==================================================================== For clinical consultation, please call [phone]. ====================================================================    Summary  Date Value Ref Range Status  12/24/2015 FINAL  Final     Comment:    ==================================================================== TOXASSURE COMP DRUG ANALYSIS,UR ==================================================================== Test                             Result       Flag       Units Drug Present and Declared for Prescription Verification   Pregabalin                     PRESENT      EXPECTED   Fluoxetine                     PRESENT      EXPECTED   Norfluoxetine                  PRESENT      EXPECTED    Norfluoxetine is an expected metabolite of fluoxetine.   Promethazine                   PRESENT      EXPECTED Drug Present not Declared for Prescription Verification   Tramadol                       PRESENT      UNEXPECTED   O-Desmethyltramadol            PRESENT      UNEXPECTED   N-Desmethyltramadol            PRESENT      UNEXPECTED    Source of tramadol is a prescription medication.    O-desmethyltramadol and N-desmethyltramadol are expected    metabolites of tramadol. Drug Absent but Declared for Prescription Verification   Trazodone                      Not Detected UNEXPECTED   Aripiprazole                   Not Detected UNEXPECTED   Hydroxyzine                    Not Detected UNEXPECTED ==================================================================== Test                      Result    Flag   Units      Ref Range   Creatinine              111              mg/dL      >=20 ==================================================================== Declared Medications:  The flagging and interpretation on this report are based on the  following declared medications.  Unexpected results may arise from  inaccuracies in the declared medications.  **Note: The testing scope of this panel includes these medications:  Aripiprazole (Abilify)  Fluoxetine (Prozac)  Hydroxyzine (Atarax)  Hydroxyzine (Vistaril)  Pregabalin (Lyrica)  Promethazine (Phenergan)  Trazodone (Desyrel)  **Note: The testing scope of this panel does not  include following  reported medications:  Abatacept  Atorvastatin (Lipitor)  Buspirone (BuSpar)  Calcium Carbonate  Clotrimazole (Lotrisone)  Famotidine (Pepcid)  Folic acid (Folvite)  Hydroxychloroquine (Plaquenil)  Leflunomide (Arava)  Levothyroxine  Lisinopril  Lovastatin (Mevacor)  Meloxicam (Mobic)  Metformin (Glucophage)  Methotrexate  Potassium (Klor-Con)  Prednisone (Deltasone)  Tamsulosin (Flomax)  Vitamin D2 (Drisdol)  Vitamin D2 (Ergocalciferol) ==================================================================== For clinical consultation, please call [phone]. ====================================================================    UDS interpretation: Compliant          Medication Assessment Form: Reviewed. Patient indicates being compliant with therapy Treatment compliance: Compliant Risk Assessment Profile: Aberrant behavior: See prior evaluations. None observed or detected today Comorbid factors increasing risk of overdose: See prior notes. No additional risks detected today Risk of substance use disorder (SUD): Low Opioid Risk Tool (ORT) Total Score: 7  Interpretation Table:  Score <3 = Low Risk for SUD  Score between 4-7 = Moderate Risk for SUD  Score >8 = High Risk for Opioid Abuse   Risk Mitigation Strategies:  Patient Counseling: Covered Patient-Prescriber Agreement (PPA): Present and active  Notification to other healthcare providers: Done  Pharmacologic Plan: No change in therapy, at this time  Laboratory Chemistry  Inflammation Markers (CRP: Acute Phase) (ESR: Chronic Phase) Lab Results  Component Value Date   CRP 0.6 12/24/2015   ESRSEDRATE 46 (H) 02/20/2016                 Renal Function Markers Lab Results  Component Value Date   BUN 11 08/15/2016   CREATININE 0.49 08/15/2016   GFRAA >60 08/15/2016   GFRNONAA >60 08/15/2016                 Hepatic Function Markers Lab Results  Component Value Date   AST 21 08/15/2016    ALT 14 08/15/2016   ALBUMIN 3.6 08/15/2016   ALKPHOS 70 08/15/2016                 Electrolytes Lab Results  Component Value Date   NA 138 08/15/2016   K 3.4 (L) 08/15/2016   CL 104 08/15/2016   CALCIUM 9.2 08/15/2016   MG 1.8 12/24/2015                 Neuropathy Markers Lab Results  Component Value Date   VITAMINB12 287 12/24/2015                 Bone Pathology Markers Lab Results  Component Value Date   ALKPHOS 70 08/15/2016   25OHVITD1 39 12/24/2015   25OHVITD2 27 12/24/2015   25OHVITD3 12 12/24/2015   CALCIUM 9.2 08/15/2016                 Coagulation Parameters Lab Results  Component Value Date   PLT 231 08/15/2016                 Cardiovascular Markers Lab Results  Component Value Date   BNP 25.0 12/24/2015   HGB 13.4 08/15/2016   HCT 39.4 08/15/2016                 Note: Lab results reviewed.  Recent Diagnostic Imaging Review  No results found. Note: Imaging results reviewed.          Meds   Current Meds  Medication Sig  . Abatacept (ORENCIA [place]) Inject into the vein every 30 (thirty) days.   . busPIRone (BUSPAR) 10 MG tablet Take 1 tablet (10 mg total) by mouth 2 (two) times daily.  . Cholecalciferol (VITAMIN D-1000 MAX ST) 1000 UNITS  tablet Take 1,000 Units by mouth daily.   . clotrimazole-betamethasone (LOTRISONE) cream Apply 1 application topically 2 (two) times daily. For yeast infection under stomach  . famotidine (PEPCID) 20 MG tablet Take 20 mg by mouth daily.   Marland Kitchen FLUoxetine (PROZAC) 20 MG capsule Take 2 capsules (40 mg total) by mouth daily.  . folic acid (FOLVITE) 1 MG tablet Take 1 mg by mouth daily.   . hydroxychloroquine (PLAQUENIL) 200 MG tablet Take 200 mg by mouth 2 (two) times daily.  . hydrOXYzine (ATARAX/VISTARIL) 50 MG tablet Take 50 mg by mouth Twice daily.   . Insulin Aspart (NOVOLOG [place]) Inject 10 Units into the skin 2 (two) times daily. Injection twice daily  . KLOR-CON M20 20 MEQ tablet TAKE ONE TABLET BY MOUTH TWICE  DAILY (Patient taking differently: take 2 tabs in the a.m. , 1 tab in the evening)  . levothyroxine (SYNTHROID, LEVOTHROID) 88 MCG tablet Take 88 mcg by mouth daily before breakfast.  . lisinopril (PRINIVIL,ZESTRIL) 2.5 MG tablet Take 2.5 mg by mouth daily.  Marland Kitchen loperamide (IMODIUM) 2 MG capsule Take 2 mg by mouth as needed.   . lovastatin (MEVACOR) 40 MG tablet Take 40 mg by mouth daily.   . Melatonin 10 MG CAPS Take 20 mg by mouth at bedtime as needed.  . metFORMIN (GLUCOPHAGE) 500 MG tablet Take 500 mg by mouth 2 (two) times daily with a meal.   . methotrexate 50 MG/2ML injection Inject 1 mL into the muscle once a week.  Derrill Memo ON 11/26/2016] oxyCODONE (OXY IR/ROXICODONE) 5 MG immediate release tablet Take 1 tablet (5 mg total) by mouth every 6 (six) hours as needed for severe pain.  . predniSONE (DELTASONE) 10 MG tablet Take 20-25 mg by mouth daily as needed (Pain scale  Used to determine dosage). Reported on 08/29/2015  . [START ON 11/26/2016] pregabalin (LYRICA) 150 MG capsule Take 1 capsule (150 mg total) by mouth 3 (three) times daily.  . promethazine (PHENERGAN) 25 MG tablet Take 1 tablet (25 mg total) by mouth every 6 (six) hours as needed for nausea or vomiting.  . tamsulosin (FLOMAX) 0.4 MG CAPS Take 0.4 mg by mouth daily.  Marland Kitchen zolpidem (AMBIEN) 5 MG tablet Take 1 tablet (5 mg total) by mouth at bedtime.  . [DISCONTINUED] oxyCODONE (OXY IR/ROXICODONE) 5 MG immediate release tablet Take 1 tablet (5 mg total) by mouth every 6 (six) hours as needed for severe pain.  . [DISCONTINUED] pregabalin (LYRICA) 150 MG capsule Take 1 capsule (150 mg total) by mouth 3 (three) times daily.  . [DISCONTINUED] promethazine (PHENERGAN) 25 MG tablet Take 1 tablet (25 mg total) by mouth every 6 (six) hours as needed for nausea or vomiting.  . [DISCONTINUED] promethazine (PHENERGAN) 25 MG tablet Take 1 tablet (25 mg total) by mouth every 6 (six) hours as needed for nausea or vomiting.  . [DISCONTINUED] Vitamin D,  Ergocalciferol, (DRISDOL) 50000 units CAPS capsule Take 50,000 Units by mouth every 7 (seven) days.     ROS  Constitutional: Denies any fever or chills Gastrointestinal: No reported hemesis, hematochezia, vomiting, or acute GI distress Musculoskeletal: Denies any acute onset joint swelling, redness, loss of ROM, or weakness Neurological: No reported episodes of acute onset apraxia, aphasia, dysarthria, agnosia, amnesia, paralysis, loss of coordination, or loss of consciousness  Allergies  Ms. Kmetz is allergic to codeine; propoxyphene; propoxyphene; sulfa antibiotics; hydrocodone; and sulfasalazine.  PFSH  Drug: Ms. Torbert  reports that she does not use drugs. Alcohol:  reports  that she does not drink alcohol. Tobacco:  reports that she quit smoking about 17 years ago. Her smoking use included Cigarettes. She has a 54.00 pack-year smoking history. She has never used smokeless tobacco. Medical:  has a past medical history of Abdominal wall hernia (01/29/2013); Anxiety; Arthritis; C. difficile colitis; Chronic diastolic heart failure ([place]); Depression; Diabetes mellitus; Diarrhea (10/22/2015); Diastolic CHF ([place]); Esophagitis; Fluid retention; GERD (gastroesophageal reflux disease); Hiatal hernia; Hypertension; Hypokalemia due to loss of potassium (10/21/2015); Hypothyroidism; IBS (irritable bowel syndrome); Moderate episode of recurrent major depressive disorder ([place]) (06/03/2004); Morbid obesity ([place]); Nausea & vomiting (08/04/2011); Neurogenic bladder; Neuropathy; Obesity; Panic attacks; Rheumatoid arthritis ([place]); and Sleep apnea. Surgical: Ms. Bark  has a past surgical history that includes Tubal ligation; Tonsillectomy; Cholecystectomy; Abdominal hysterectomy; Laparoscopic gastric banding (03/20/07); Eye surgery; and Hernia repair. Family: family history includes Alcohol abuse in her father and sister; Anxiety disorder in her father, sister, and sister; Bipolar disorder in her father and sister;  Depression in her father, sister, and sister; Drug abuse in her sister; Heart attack in her brother; Heart attack (age of onset: 39) in her brother; Heart disease in her brother; Heart failure in her father.  Constitutional Exam  General appearance: Well nourished, well developed, and well hydrated. In no apparent acute distress Vitals:   11/10/16 0959  BP: (!) 141/88  Pulse: 62  Resp: 16  Temp: 98.4 F (36.9 C)  TempSrc: Oral  SpO2: 99%  Weight: (!) 318 lb (144.2 kg)  Height: 5' 6"  (1.676 m)   BMI Assessment: Estimated body mass index is 51.33 kg/m as calculated from the following:   Height as of this encounter: 5' 6"  (1.676 m).   Weight as of this encounter: 318 lb (144.2 kg).  BMI interpretation table: BMI level Category Range association with higher incidence of chronic pain  <18 kg/m2 Underweight   18.5-24.9 kg/m2 Ideal body weight   25-29.9 kg/m2 Overweight Increased incidence by 20%  30-34.9 kg/m2 Obese (Class I) Increased incidence by 68%  35-39.9 kg/m2 Severe obesity (Class II) Increased incidence by 136%  >40 kg/m2 Extreme obesity (Class III) Increased incidence by 254%   BMI Readings from Last 4 Encounters:  11/10/16 51.33 kg/m  08/19/16 50.20 kg/m  07/01/16 49.23 kg/m  04/28/16 48.10 kg/m   Wt Readings from Last 4 Encounters:  11/10/16 (!) 318 lb (144.2 kg)  08/19/16 (!) 311 lb (141.1 kg)  07/01/16 (!) 305 lb (138.3 kg)  04/28/16 298 lb (135.2 kg)  Psych/Mental status: Alert, oriented x 3 (person, place, & time)       Eyes: PERLA Respiratory: No evidence of acute respiratory distress  Cervical Spine Exam  Inspection: No masses, redness, or swelling Alignment: Symmetrical Functional ROM: Unrestricted ROM      Stability: No instability detected Muscle strength & Tone: Functionally intact Sensory: Unimpaired Palpation: No palpable anomalies              Upper Extremity (UE) Exam    Side: Right upper extremity  Side: Left upper extremity   Inspection: nodules to elbow  Inspection:nodules to elbow  Functional ROM: Pain restricted ROM for wrist  Functional ROM: Pain restricted ROM for wrist  Muscle strength & Tone: Functionally intact  Muscle strength & Tone: Functionally intact  Sensory: Unimpaired  Sensory: Unimpaired  Palpation: No palpable anomalies              Palpation: No palpable anomalies  Specialized Test(s): Deferred         Specialized Test(s): Deferred          Thoracic Spine Exam  Inspection: No masses, redness, or swelling Alignment: Symmetrical Functional ROM: Unrestricted ROM Stability: No instability detected Sensory: Unimpaired Muscle strength & Tone: No palpable anomalies  Lumbar Spine Exam  Inspection: No masses, redness, or swelling Alignment: Symmetrical Functional ROM: Unrestricted ROM      Stability: No instability detected Muscle strength & Tone: Functionally intact Sensory: Unimpaired Palpation: No palpable anomalies       Provocative Tests: Lumbar Hyperextension and rotation test: evaluation deferred today       Lumbar Lateral bending test: evaluation deferred today       Patrick's Maneuver: evaluation deferred today                    Gait & Posture Assessment  Ambulation: Unassisted Gait: Relatively normal for age and body habitus Posture: WNL   Lower Extremity Exam    Side: Right lower extremity  Side: Left lower extremity  Inspection: No masses, redness, swelling, or asymmetry. No contractures  Inspection: No masses, redness, swelling, or asymmetry. No contractures  Functional ROM: Unrestricted ROM          Functional ROM: Unrestricted ROM          Muscle strength & Tone: Functionally intact  Muscle strength & Tone: Functionally intact  Sensory: Unimpaired  Sensory: Unimpaired  Palpation: No palpable anomalies  Palpation: No palpable anomalies   Assessment  Primary Diagnosis & Pertinent Problem List: The primary encounter diagnosis was Rheumatoid arthritis  involving multiple sites with positive rheumatoid factor ([place]). Diagnoses of Chronic wrist pain (Location of Secondary source of pain) (Bilateral) (L>R), Chronic elbow pain (Location of Tertiary source of pain) (Bilateral) (L>R), Neuropathic pain, Diabetic peripheral neuropathy (HCC), Neurogenic pain, Insomnia secondary to chronic pain, and Chronic pain syndrome were also pertinent to this visit.  Status Diagnosis  Controlled Controlled Controlled 1. Rheumatoid arthritis involving multiple sites with positive rheumatoid factor ([place])   2. Chronic wrist pain (Location of Secondary source of pain) (Bilateral) (L>R)   3. Chronic elbow pain (Location of Tertiary source of pain) (Bilateral) (L>R)   4. Neuropathic pain   5. Diabetic peripheral neuropathy ([place])   6. Neurogenic pain   7. Insomnia secondary to chronic pain   8. Chronic pain syndrome     Problems updated and reviewed during this visit: Problem  Rheumatoid Arthritis (Hcc)  Chronic Obstructive Pulmonary Disease (Hcc)  Neuropathy  Major Depressive Disorder, Recurrent Episode, Moderate (Hcc)   Plan of Care  Pharmacotherapy (Medications Ordered): Meds ordered this encounter  Medications  . pregabalin (LYRICA) 150 MG capsule    Sig: Take 1 capsule (150 mg total) by mouth 3 (three) times daily.    Dispense:  270 capsule    Refill:  0    Do not add this medication to the electronic "Automatic Refill" notification system. Patient may have prescription filled one day early if pharmacy is closed on scheduled refill date.    Order Specific Question:   Supervising Provider    Answer:   Milinda Pointer [phone]  . oxyCODONE (OXY IR/ROXICODONE) 5 MG immediate release tablet    Sig: Take 1 tablet (5 mg total) by mouth every 6 (six) hours as needed for severe pain.    Dispense:  120 tablet    Refill:  0    Do not place this medication, or any other prescription from our  practice, on "Automatic Refill". Patient may have prescription filled  one day early if pharmacy is closed on scheduled refill date. Do not fill until: 11/26/2016 To last until:12/26/2016    Order Specific Question:   Supervising Provider    Answer:   Milinda Pointer [phone]  . oxyCODONE (OXY IR/ROXICODONE) 5 MG immediate release tablet    Sig: Take 1 tablet (5 mg total) by mouth every 6 (six) hours as needed for severe pain.    Dispense:  120 tablet    Refill:  0    Do not place this medication, or any other prescription from our practice, on "Automatic Refill". Patient may have prescription filled one day early if pharmacy is closed on scheduled refill date. Do not fill until:12/26/2016 To last until:01/25/2017    Order Specific Question:   Supervising Provider    Answer:   Milinda Pointer [phone]  . oxyCODONE (OXY IR/ROXICODONE) 5 MG immediate release tablet    Sig: Take 1 tablet (5 mg total) by mouth every 6 (six) hours as needed for severe pain.    Dispense:  120 tablet    Refill:  0    Do not place this medication, or any other prescription from our practice, on "Automatic Refill". Patient may have prescription filled one day early if pharmacy is closed on scheduled refill date. Do not fill until: 01/25/2017 To last until: 02/24/2017    Order Specific Question:   Supervising Provider    Answer:   Milinda Pointer [phone]  . DISCONTD: promethazine (PHENERGAN) 25 MG tablet    Sig: Take 1 tablet (25 mg total) by mouth every 6 (six) hours as needed for nausea or vomiting.    Dispense:  15 tablet    Refill:  2    Order Specific Question:   Supervising Provider    Answer:   Milinda Pointer [phone]  . promethazine (PHENERGAN) 25 MG tablet    Sig: Take 1 tablet (25 mg total) by mouth every 6 (six) hours as needed for nausea or vomiting.    Dispense:  15 tablet    Refill:  2    Order Specific Question:   Supervising Provider    Answer:   Milinda Pointer [phone]   New Prescriptions   No medications on file   Medications administered  today: Ms. Gonser had no medications administered during this visit. Lab-work, procedure(s), and/or referral(s): Orders Placed This Encounter  Procedures  . Sedimentation rate  . Uric acid   Imaging and/or referral(s): None  Interventional therapies: Planned, scheduled, and/or pending:   Not at this time.   Considering:   IV lidocaine infusions.  Diagnostic bilateral lumbar sympathetic block  Intra-articular injection with local anesthetic and steroids.  Diagnostic bilateral intra-articular shoulder joint injection  Diagnostic bilateral intra-articular knee injections with local anesthetic and steroid  Diagnostic right-sided cervical epidural steroid injections  Diagnostic bilateral Cervicalfacet block  Possible bilateral cervical facet radiofrequency ablation.    Palliative PRN treatment(s):   Palliative IV lidocaine infusion   Provider-requested follow-up: Return in about 3 months (around 02/10/2017) for MedMgmt.  Future Appointments Date Time Provider [place]  12/30/2016 1:00 PM Clapacs, Madie [place]o, MD [facility] None  02/09/2017 9:30 AM Vevelyn Francois, NP [facility] None   Primary Care Physician: [facility] [place] Location: [facility] Facility Note by: Vevelyn Francois NP Date: 11/10/2016; Time: 2:41 PM  Pain Score Disclaimer: We use the NRS-11 scale. This is a self-reported, subjective measurement of pain severity with  only modest accuracy. It is used primarily to identify changes within a particular patient. It must be understood that outpatient pain scales are significantly less accurate that those used for research, where they can be applied under ideal controlled circumstances with minimal exposure to variables. In reality, the score is likely to be a combination of pain intensity and pain affect, where pain affect describes the degree of emotional arousal or changes in action readiness caused by the sensory experience of pain.  Factors such as social and work situation, setting, emotional state, anxiety levels, expectation, and prior pain experience may influence pain perception and show large inter-individual differences that may also be affected by time variables.  Patient instructions provided during this appointment: Patient Instructions   ____________________________________________________________________________________________  Medication Rules  Applies to: All patients receiving prescriptions (written or electronic).  Pharmacy of record: Pharmacy where electronic prescriptions will be sent. If written prescriptions are taken to a different pharmacy, please inform the nursing staff. The pharmacy listed in the electronic medical record should be the one where you would like electronic prescriptions to be sent.  Prescription refills: Only during scheduled appointments. Applies to both, written and electronic prescriptions.  NOTE: The following applies primarily to controlled substances (Opioid* Pain Medications).   Patient's responsibilities: 1. Pain Pills: Bring all pain pills to every appointment (except for procedure appointments). 2. Pill Bottles: Bring pills in original pharmacy bottle. Always bring newest bottle. Bring bottle, even if empty. 3. Medication refills: You are responsible for knowing and keeping track of what medications you need refilled. The day before your appointment, write a list of all prescriptions that need to be refilled. Bring that list to your appointment and give it to the admitting nurse. Prescriptions will be written only during appointments. If you forget a medication, it will not be "Called in", "Faxed", or "electronically sent". You will need to get another appointment to get these prescribed. 4. Prescription Accuracy: You are responsible for carefully inspecting your prescriptions before leaving our office. Have the discharge nurse carefully go over each prescription with you,  before taking them home. Make sure that your name is accurately spelled, that your address is correct. Check the name and dose of your medication to make sure it is accurate. Check the number of pills, and the written instructions to make sure they are clear and accurate. Make sure that you are given enough medication to last until your next medication refill appointment. 5. Taking Medication: Take medication as prescribed. Never take more pills than instructed. Never take medication more frequently than prescribed. Taking less pills or less frequently is permitted and encouraged, when it comes to controlled substances (written prescriptions).  6. Inform other Doctors: Always inform, all of your healthcare providers, of all the medications you take. 7. Pain Medication from other Providers: You are not allowed to accept any additional pain medication from any other Doctor or Healthcare provider. There are two exceptions to this rule. (see below) In the event that you require additional pain medication, you are responsible for notifying us, as stated below. 8. Medication Agreement: You are responsible for carefully reading and following our Medication Agreement. This must be signed before receiving any prescriptions from our practice. Safely store a copy of your signed Agreement. Violations to the Agreement will result in no further prescriptions. (Additional copies of our Medication Agreement are available upon request.) 9. Laws, Rules, & Regulations: All patients are expected to follow all Federal and [employer], Rules, [employer]. Ignorance  of the Laws does not constitute a valid excuse. The use of any illegal substances is prohibited. 10. Adopted CDC guidelines & recommendations: Target dosing levels will be at or below 60 MME/day. Use of benzodiazepines** is not recommended.  Exceptions: There are only two exceptions to the rule of not receiving pain medications from other Healthcare  Providers. 1. Exception #1 (Emergencies): In the event of an emergency (i.e.: accident requiring emergency care), you are allowed to receive additional pain medication. However, you are responsible for: As soon as you are able, call our office [phone], at any time of the day or night, and leave a message stating your name, the date and nature of the emergency, and the name and dose of the medication prescribed. In the event that your call is answered by a member of our staff, make sure to document and save the date, time, and the name of the person that took your information.  2. Exception #2 (Planned Surgery): In the event that you are scheduled by another doctor or dentist to have any type of surgery or procedure, you are allowed (for a period no longer than 30 days), to receive additional pain medication, for the acute post-op pain. However, in this case, you are responsible for picking up a copy of our "Post-op Pain Management for Surgeons" handout, and giving it to your surgeon or dentist. This document is available at our office, and does not require an appointment to obtain it. Simply go to our office during business hours (Monday-Thursday from 8:00 AM to 4:00 PM) (Friday 8:00 AM to 12:00 Noon) or if you have a scheduled appointment with [name], prior to your surgery, and ask for it by name. In addition, you will need to provide [name] with your name, name of your surgeon, type of surgery, and date of procedure or surgery.  *Opioid medications include: morphine, codeine, oxycodone, oxymorphone, hydrocodone, hydromorphone, meperidine, tramadol, tapentadol, buprenorphine, fentanyl, methadone. **Benzodiazepine medications include: diazepam (Valium), alprazolam (Xanax), clonazepam (Klonopine), lorazepam (Ativan), clorazepate (Tranxene), chlordiazepoxide (Librium), estazolam (Prosom), oxazepam (Serax), temazepam (Restoril), triazolam  (Halcion)  ____________________________________________________________________________________________

## 2016-11-10 NOTE — Patient Instructions (Signed)
____________________________________________________________________________________________  Medication Rules  Applies to: All patients receiving prescriptions (written or electronic).  Pharmacy of record: Pharmacy where electronic prescriptions will be sent. If written prescriptions are taken to a different pharmacy, please inform the nursing staff. The pharmacy listed in the electronic medical record should be the one where you would like electronic prescriptions to be sent.  Prescription refills: Only during scheduled appointments. Applies to both, written and electronic prescriptions.  NOTE: The following applies primarily to controlled substances (Opioid* Pain Medications).   Patient's responsibilities: 1. Pain Pills: Bring all pain pills to every appointment (except for procedure appointments). 2. Pill Bottles: Bring pills in original pharmacy bottle. Always bring newest bottle. Bring bottle, even if empty. 3. Medication refills: You are responsible for knowing and keeping track of what medications you need refilled. The day before your appointment, write a list of all prescriptions that need to be refilled. Bring that list to your appointment and give it to the admitting nurse. Prescriptions will be written only during appointments. If you forget a medication, it will not be "Called in", "Faxed", or "electronically sent". You will need to get another appointment to get these prescribed. 4. Prescription Accuracy: You are responsible for carefully inspecting your prescriptions before leaving our office. Have the discharge nurse carefully go over each prescription with you, before taking them home. Make sure that your name is accurately spelled, that your address is correct. Check the name and dose of your medication to make sure it is accurate. Check the number of pills, and the written instructions to make sure they are clear and accurate. Make sure that you are given enough medication to  last until your next medication refill appointment. 5. Taking Medication: Take medication as prescribed. Never take more pills than instructed. Never take medication more frequently than prescribed. Taking less pills or less frequently is permitted and encouraged, when it comes to controlled substances (written prescriptions).  6. Inform other Doctors: Always inform, all of your healthcare providers, of all the medications you take. 7. Pain Medication from other Providers: You are not allowed to accept any additional pain medication from any other Doctor or Healthcare provider. There are two exceptions to this rule. (see below) In the event that you require additional pain medication, you are responsible for notifying us, as stated below. 8. Medication Agreement: You are responsible for carefully reading and following our Medication Agreement. This must be signed before receiving any prescriptions from our practice. Safely store a copy of your signed Agreement. Violations to the Agreement will result in no further prescriptions. (Additional copies of our Medication Agreement are available upon request.) 9. Laws, Rules, & Regulations: All patients are expected to follow all Federal and State Laws, Statutes, Rules, & Regulations. Ignorance of the Laws does not constitute a valid excuse. The use of any illegal substances is prohibited. 10. Adopted CDC guidelines & recommendations: Target dosing levels will be at or below 60 MME/day. Use of benzodiazepines** is not recommended.  Exceptions: There are only two exceptions to the rule of not receiving pain medications from other Healthcare Providers. 1. Exception #1 (Emergencies): In the event of an emergency (i.e.: accident requiring emergency care), you are allowed to receive additional pain medication. However, you are responsible for: As soon as you are able, call our office [phone], at any time of the day or night, and leave a message stating your  name, the date and nature of the emergency, and the name and dose of the medication   prescribed. In the event that your call is answered by a member of our staff, make sure to document and save the date, time, and the name of the person that took your information.  2. Exception #2 (Planned Surgery): In the event that you are scheduled by another doctor or dentist to have any type of surgery or procedure, you are allowed (for a period no longer than 30 days), to receive additional pain medication, for the acute post-op pain. However, in this case, you are responsible for picking up a copy of our "Post-op Pain Management for Surgeons" handout, and giving it to your surgeon or dentist. This document is available at our office, and does not require an appointment to obtain it. Simply go to our office during business hours (Monday-Thursday from 8:00 AM to 4:00 PM) (Friday 8:00 AM to 12:00 Noon) or if you have a scheduled appointment with us, prior to your surgery, and ask for it by name. In addition, you will need to provide us with your name, name of your surgeon, type of surgery, and date of procedure or surgery.  *Opioid medications include: morphine, codeine, oxycodone, oxymorphone, hydrocodone, hydromorphone, meperidine, tramadol, tapentadol, buprenorphine, fentanyl, methadone. **Benzodiazepine medications include: diazepam (Valium), alprazolam (Xanax), clonazepam (Klonopine), lorazepam (Ativan), clorazepate (Tranxene), chlordiazepoxide (Librium), estazolam (Prosom), oxazepam (Serax), temazepam (Restoril), triazolam (Halcion)  ____________________________________________________________________________________________   

## 2016-11-10 NOTE — Progress Notes (Signed)
Nursing Pain Medication Assessment:  Safety precautions to be maintained throughout the outpatient stay will include: orient to surroundings, keep bed in low position, maintain call bell within reach at all times, provide assistance with transfer out of bed and ambulation.  Medication Inspection Compliance: Pill count conducted under aseptic conditions, in front of the patient. Neither the pills nor the bottle was removed from the patient's sight at any time. Once count was completed pills were immediately returned to the patient in their original bottle.  Medication: Oxycodone IR Pill/Patch Count: 30 of 120 pills remain Pill/Patch Appearance: Markings consistent with prescribed medication Bottle Appearance: Standard pharmacy container. Clearly labeled. Filled Date: 07 / 01 / 2018 Last Medication intake:  Today

## 2016-11-11 LAB — URIC ACID: Uric Acid: 6.9 mg/dL (ref 2.5–7.1)

## 2016-11-11 LAB — SEDIMENTATION RATE: Sed Rate: 27 mm/hr (ref 0–40)

## 2016-11-11 MED ORDER — HYDROXYCHLOROQUINE 200 MG TABLET
ORAL_TABLET | Freq: Two times a day (BID) | ORAL | 2 refills | 0 days | Status: SS
Start: 2016-11-11 — End: 2018-08-26

## 2016-11-15 ENCOUNTER — Ambulatory Visit
Admission: RE | Admit: 2016-11-15 | Discharge: 2016-11-15 | Disposition: A | Discharge status: Home / Self Care | Payer: MEDICAID

## 2016-11-15 DIAGNOSIS — M069 Rheumatoid arthritis, unspecified: Principal | ICD-10-CM

## 2016-11-29 ENCOUNTER — Ambulatory Visit
Admission: RE | Admit: 2016-11-29 | Discharge: 2016-11-29 | Disposition: A | Discharge status: Home / Self Care | Payer: MEDICAID | Attending: Rheumatology | Admitting: Rheumatology

## 2016-11-29 DIAGNOSIS — M069 Rheumatoid arthritis, unspecified: Principal | ICD-10-CM

## 2016-11-29 MED ORDER — PREDNISONE 5 MG TABLET
ORAL_TABLET | 1 refills | 0 days | Status: CP
Start: 2016-11-29 — End: 2017-10-02

## 2016-12-06 ENCOUNTER — Encounter: Payer: Self-pay | Admitting: Emergency Medicine

## 2016-12-06 ENCOUNTER — Ambulatory Visit
Admission: EM | Admit: 2016-12-06 | Discharge: 2016-12-06 | Disposition: A | Discharge status: Home / Self Care | Payer: Medicaid Other | Attending: Family Medicine | Admitting: Family Medicine

## 2016-12-06 DIAGNOSIS — F319 Bipolar disorder, unspecified: Secondary | ICD-10-CM | POA: Diagnosis not present

## 2016-12-06 DIAGNOSIS — I5032 Chronic diastolic (congestive) heart failure: Secondary | ICD-10-CM | POA: Insufficient documentation

## 2016-12-06 DIAGNOSIS — K219 Gastro-esophageal reflux disease without esophagitis: Secondary | ICD-10-CM | POA: Diagnosis not present

## 2016-12-06 DIAGNOSIS — M069 Rheumatoid arthritis, unspecified: Secondary | ICD-10-CM | POA: Diagnosis not present

## 2016-12-06 DIAGNOSIS — R319 Hematuria, unspecified: Secondary | ICD-10-CM | POA: Insufficient documentation

## 2016-12-06 DIAGNOSIS — Z950 Presence of cardiac pacemaker: Secondary | ICD-10-CM | POA: Diagnosis not present

## 2016-12-06 DIAGNOSIS — G894 Chronic pain syndrome: Secondary | ICD-10-CM | POA: Diagnosis not present

## 2016-12-06 DIAGNOSIS — E559 Vitamin D deficiency, unspecified: Secondary | ICD-10-CM | POA: Diagnosis not present

## 2016-12-06 DIAGNOSIS — E039 Hypothyroidism, unspecified: Secondary | ICD-10-CM | POA: Diagnosis not present

## 2016-12-06 DIAGNOSIS — E1165 Type 2 diabetes mellitus with hyperglycemia: Secondary | ICD-10-CM | POA: Diagnosis not present

## 2016-12-06 DIAGNOSIS — J449 Chronic obstructive pulmonary disease, unspecified: Secondary | ICD-10-CM | POA: Diagnosis not present

## 2016-12-06 DIAGNOSIS — Z6841 Body Mass Index (BMI) 40.0 and over, adult: Secondary | ICD-10-CM | POA: Diagnosis not present

## 2016-12-06 DIAGNOSIS — Z79891 Long term (current) use of opiate analgesic: Secondary | ICD-10-CM | POA: Diagnosis not present

## 2016-12-06 DIAGNOSIS — Z87891 Personal history of nicotine dependence: Secondary | ICD-10-CM | POA: Diagnosis not present

## 2016-12-06 DIAGNOSIS — K589 Irritable bowel syndrome without diarrhea: Secondary | ICD-10-CM | POA: Diagnosis not present

## 2016-12-06 DIAGNOSIS — Z9049 Acquired absence of other specified parts of digestive tract: Secondary | ICD-10-CM | POA: Insufficient documentation

## 2016-12-06 DIAGNOSIS — E7801 Familial hypercholesterolemia: Secondary | ICD-10-CM | POA: Insufficient documentation

## 2016-12-06 DIAGNOSIS — Z794 Long term (current) use of insulin: Secondary | ICD-10-CM | POA: Insufficient documentation

## 2016-12-06 DIAGNOSIS — M1990 Unspecified osteoarthritis, unspecified site: Secondary | ICD-10-CM | POA: Diagnosis not present

## 2016-12-06 DIAGNOSIS — I110 Hypertensive heart disease with heart failure: Secondary | ICD-10-CM | POA: Insufficient documentation

## 2016-12-06 DIAGNOSIS — G4701 Insomnia due to medical condition: Secondary | ICD-10-CM | POA: Insufficient documentation

## 2016-12-06 DIAGNOSIS — M546 Pain in thoracic spine: Secondary | ICD-10-CM | POA: Diagnosis not present

## 2016-12-06 DIAGNOSIS — E1142 Type 2 diabetes mellitus with diabetic polyneuropathy: Secondary | ICD-10-CM | POA: Diagnosis not present

## 2016-12-06 DIAGNOSIS — Z7952 Long term (current) use of systemic steroids: Secondary | ICD-10-CM | POA: Insufficient documentation

## 2016-12-06 DIAGNOSIS — N390 Urinary tract infection, site not specified: Secondary | ICD-10-CM | POA: Insufficient documentation

## 2016-12-06 DIAGNOSIS — G4730 Sleep apnea, unspecified: Secondary | ICD-10-CM | POA: Insufficient documentation

## 2016-12-06 DIAGNOSIS — R300 Dysuria: Secondary | ICD-10-CM | POA: Diagnosis present

## 2016-12-06 LAB — URINALYSIS, COMPLETE (UACMP) WITH MICROSCOPIC
Bilirubin Urine: NEGATIVE
Glucose, UA: NEGATIVE mg/dL
Ketones, ur: NEGATIVE mg/dL
Nitrite: NEGATIVE
Protein, ur: 30 mg/dL — AB
Specific Gravity, Urine: 1.025 (ref 1.005–1.030)
pH: 5 (ref 5.0–8.0)

## 2016-12-06 MED ORDER — NITROFURANTOIN MONOHYD MACRO 100 MG PO CAPS: 100.0000 mg | ORAL_CAPSULE | Freq: Two times a day (BID) | ORAL | 0 refills | Status: DC

## 2016-12-06 NOTE — ED Provider Notes (Addendum)
[facility] ____________________________________________  Time seen: Approximately 1255  PM  I have reviewed the triage vital signs and the nursing notes.   HISTORY  Chief Complaint Dysuria   HPI [name] [name] is a 56 y.o. female  Presenting for evaluation of 2 days of urinary frequency, urinary urgency, burning with urination and foul-smelling odor to urine. Reports continues to eat and drink well. Reports history of multiple urinary tract infections in the past with similar presentation. States last UTI approximately 1 year greater ago. States does have some resistance to antibiotics and urines in past, unsure of which. Follows with Dr. Achilles Dunk urology. No recent urinary complaints. Denies known trigger. States has had some diarrhea, but states she has chronic loose stool. Denies vomiting, fever, abdominal pain, atypical back pain. Reports has continued to remain active. Reports otherwise feels well denies other complaints. Denies recent sickness. Denies recent antibiotic use. Denies renal insufficiency.   Center, [facility]: PCP Patient's last menstrual period was 04/20/2001.   Past Medical History:  Diagnosis Date  . Abdominal wall hernia 01/29/2013  . Anxiety   . Arthritis    Rheumatoid  . C. difficile colitis   . Chronic diastolic heart failure (HCC)   . Depression   . Diabetes mellitus    states no meds or diet restrictions  at present  . Diarrhea 10/22/2015  . Diastolic CHF (HCC)   . Esophagitis   . Fluid retention   . GERD (gastroesophageal reflux disease)   . Hiatal hernia   . Hypertension   . Hypokalemia due to loss of potassium 10/21/2015   Overview:  Associated with 3 weeks of diarrhea  And QT prolongation.  . Hypothyroidism   . IBS (irritable bowel syndrome)   . Moderate episode of recurrent major depressive disorder (HCC) 06/03/2004  . Morbid obesity (HCC)   . Nausea & vomiting 08/04/2011  . Neurogenic bladder    has pacemaker  .  Neuropathy   . Obesity   . Panic attacks   . Rheumatoid arthritis (HCC)   . Sleep apnea    STATES SEVERE, CANT TOLERATE MASK- LAST STUDY YEARS AGO    Patient Active Problem List   Diagnosis Date Noted  . Chronic pain syndrome 03/31/2016  . Insomnia secondary to chronic pain 03/31/2016  . Chronic upper back pain 12/25/2015  . Chronic hand pain (Bilateral) (L>R) 12/25/2015  . Rheumatoid arthritis (HCC) 12/25/2015  . Morbid obesity with BMI of 40.0-44.9, adult (HCC) 12/24/2015  . Osteoarthritis, multiple sites 12/24/2015  . Chronic foot pain (Location of Primary Source of Pain) (Bilateral) (L>R) 12/24/2015  . Chronic elbow pain (Location of Tertiary source of pain) (Bilateral) (L>R) 12/24/2015  . Chronic shoulder pain (Bilateral) (L>R) 12/24/2015  . Chronic neck pain (Bilateral) (R>L) 12/24/2015  . 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[employer]) 12/23/2015  . Chronic knee pain (Bilateral) (R>L) 12/23/2015  . Long term current use of opiate analgesic 12/23/2015  . Long term prescription opiate use 12/23/2015  . Opiate use (30 MME/Day) 12/23/2015  . Neurogenic pain 12/23/2015  . Neuropathic pain 12/23/2015  . Diabetic peripheral neuropathy (HCC) 12/23/2015  . Encounter for therapeutic drug level monitoring 12/23/2015  . Encounter for pain management planning 12/23/2015  . GERD (gastroesophageal reflux disease) 11/25/2015  . Neuropathy 11/25/2015  . Hypokalemia 10/21/2015  . Hypomagnesemia 10/21/2015  . QT prolongation 10/21/2015  . Type 2 diabetes mellitus with hyperglycemia (HCC) 10/21/2015  . Chronic wrist pain (Location of Secondary source of pain) (Bilateral) (L>R) 03/19/2015  .  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 Female genuine stress incontinence 02/14/2015  . Urge incontinence of urine 02/14/2015  . Obstructive apnea 07/03/2014  . Chronic diastolic CHF (congestive heart failure), NYHA class 3 (HCC) 07/03/2014  . Anxiety 01/03/2014  . Diabetes mellitus (HCC)  01/03/2014  . Chronic obstructive pulmonary disease (HCC) 01/03/2014  . Bipolar affective disorder (HCC) 01/03/2014  . Diastolic dysfunction 01/03/2014  . Combined fat and carbohydrate induced hyperlipemia 01/03/2014  . Shortness of breath 01/03/2014  . Incomplete bladder emptying 11/02/2012  . Bladder retention 10/10/2012  . Detrusor muscle hypertonia 10/04/2012  . Obstruction of urinary tract 10/04/2012  . FOM (frequency of micturition) 10/04/2012  . Vitamin D deficiency 04/15/2012  . Borderline personality disorder 01/06/2012  . Mixed anxiety depressive disorder 01/06/2012  . History of laparoscopic adjustable gastric banding, 03/20/2007.  Removed 09/19/2011. 08/04/2011  . Hypothyroidism 06/28/2010  . Essential (primary) hypertension 01/27/2005  . Major depressive disorder, recurrent episode, moderate (HCC) 06/03/2004    Past Surgical History:  Procedure Laterality Date  . ABDOMINAL HYSTERECTOMY    . CHOLECYSTECTOMY    . EYE SURGERY     bilateral cataract extraction with IOL  . HERNIA REPAIR     ventral hernia with strangulation  . LAPAROSCOPIC GASTRIC BANDING  03/20/07  . TONSILLECTOMY    . TUBAL LIGATION       No current facility-administered medications for this encounter.   Current Outpatient Prescriptions:  .  Abatacept (ORENCIA [place]), Inject into the vein every 30 (thirty) days. , Disp: , Rfl:  .  busPIRone (BUSPAR) 10 MG tablet, TAKE 1 TABLET BY MOUTH TWICE A DAY, Disp: 60 tablet, Rfl: 1 .  Cholecalciferol (VITAMIN D-1000 MAX ST) 1000 UNITS tablet, Take 1,000 Units by mouth daily. , Disp: , Rfl:  .  clotrimazole-betamethasone (LOTRISONE) cream, Apply 1 application topically 2 (two) times daily. For yeast infection under stomach, Disp: , Rfl:  .  famotidine (PEPCID) 20 MG tablet, Take 20 mg by mouth daily. , Disp: , Rfl:  .  FLUoxetine (PROZAC) 20 MG capsule, Take 2 capsules (40 mg total) by mouth daily., Disp: 60 capsule, Rfl: 5 .  folic acid (FOLVITE) 1 MG tablet, Take  1 mg by mouth daily. , Disp: , Rfl:  .  hydroxychloroquine (PLAQUENIL) 200 MG tablet, Take 200 mg by mouth 2 (two) times daily., Disp: , Rfl:  .  hydrOXYzine (ATARAX/VISTARIL) 50 MG tablet, Take 50 mg by mouth Twice daily. , Disp: , Rfl:  .  Insulin Aspart (NOVOLOG [place]), Inject 10 Units into the skin 2 (two) times daily. Injection twice daily, Disp: , Rfl:  .  KLOR-CON M20 20 MEQ tablet, TAKE ONE TABLET BY MOUTH TWICE DAILY (Patient taking differently: take 2 tabs in the a.m. , 1 tab in the evening), Disp: 60 tablet, Rfl: 0 .  levothyroxine (SYNTHROID, LEVOTHROID) 88 MCG tablet, Take 88 mcg by mouth daily before breakfast., Disp: , Rfl:  .  lisinopril (PRINIVIL,ZESTRIL) 2.5 MG tablet, Take 2.5 mg by mouth daily., Disp: , Rfl:  .  loperamide (IMODIUM) 2 MG capsule, Take 2 mg by mouth as needed. , Disp: , Rfl: 0 .  lovastatin (MEVACOR) 40 MG tablet, Take 40 mg by mouth daily. , Disp: , Rfl:  .  Melatonin 10 MG CAPS, Take 20 mg by mouth at bedtime as needed., Disp: 180 capsule, Rfl: 0 .  metFORMIN (GLUCOPHAGE) 500 MG tablet, Take 500 mg by mouth 2 (two) times daily with a meal. , Disp: , Rfl:  .  methotrexate 50 MG/2ML injection, Inject 1 mL into the muscle once a week., Disp: , Rfl:  .  nitrofurantoin, macrocrystal-monohydrate, (MACROBID) 100 MG capsule, Take 1 capsule (100 mg total) by mouth 2 (two) times daily., Disp: 10 capsule, Rfl: 0 .  oxyCODONE (OXY IR/ROXICODONE) 5 MG immediate release tablet, Take 1 tablet (5 mg total) by mouth every 6 (six) hours as needed for severe pain., Disp: 120 tablet, Rfl: 0 .  [START ON 12/26/2016] oxyCODONE (OXY IR/ROXICODONE) 5 MG immediate release tablet, Take 1 tablet (5 mg total) by mouth every 6 (six) hours as needed for severe pain., Disp: 120 tablet, Rfl: 0 .  [START ON 01/25/2017] oxyCODONE (OXY IR/ROXICODONE) 5 MG immediate release tablet, Take 1 tablet (5 mg total) by mouth every 6 (six) hours as needed for severe pain., Disp: 120 tablet, Rfl: 0 .  predniSONE  (DELTASONE) 10 MG tablet, Take 20-25 mg by mouth daily as needed (Pain scale  Used to determine dosage). Reported on 08/29/2015, Disp: , Rfl:  .  pregabalin (LYRICA) 150 MG capsule, Take 1 capsule (150 mg total) by mouth 3 (three) times daily., Disp: 270 capsule, Rfl: 0 .  promethazine (PHENERGAN) 25 MG tablet, Take 1 tablet (25 mg total) by mouth every 6 (six) hours as needed for nausea or vomiting., Disp: 15 tablet, Rfl: 2 .  tamsulosin (FLOMAX) 0.4 MG CAPS, Take 0.4 mg by mouth daily., Disp: , Rfl:  .  zolpidem (AMBIEN) 5 MG tablet, Take 1 tablet (5 mg total) by mouth at bedtime., Disp: 30 tablet, Rfl: 5  Allergies Codeine; Propoxyphene; Propoxyphene; Sulfa antibiotics; Hydrocodone; and Sulfasalazine  Family History  Problem Relation Age of Onset  . Heart failure Father   . Bipolar disorder Father   . Alcohol abuse Father   . Anxiety disorder Father   . Depression Father   . Heart disease Brother   . Heart attack Brother 19       MI s/p stents placed  . Anxiety disorder Sister   . Depression Sister   . Anxiety disorder Sister   . Depression Sister   . Bipolar disorder Sister   . Alcohol abuse Sister   . Drug abuse Sister   . Heart attack Brother     Social History Social History  Substance Use Topics  . Smoking status: Former Smoker    Packs/day: 2.00    Years: 27.00    Types: Cigarettes    Quit date: 07/30/1999  . Smokeless tobacco: Never Used  . Alcohol use No    Review of Systems Constitutional: No fever/chills Cardiovascular: Denies chest pain. Respiratory: Denies shortness of breath. Gastrointestinal: No abdominal pain.   Genitourinary: Positive for dysuria. Musculoskeletal: Negative for atypical back pain. Skin: Negative for rash.  ____________________________________________   PHYSICAL EXAM:  VITAL SIGNS: ED Triage Vitals  Enc Vitals Group     BP 12/06/16 1227 127/76     Pulse Rate 12/06/16 1227 91     Resp 12/06/16 1227 16     Temp 12/06/16 1227  98.4 F (36.9 C)     Temp Source 12/06/16 1227 Oral     SpO2 12/06/16 1227 97 %     Weight 12/06/16 1225 (!) 325 lb (147.4 kg)     Height 12/06/16 1225 5\' 6"  (1.676 m)     Head Circumference --      Peak Flow --      Pain Score 12/06/16 1225 6     Pain Loc --  Pain Edu? --      Excl. in GC? --     Constitutional: Alert and oriented. Well appearing and in no acute distress. Cardiovascular: Normal rate, regular rhythm. Grossly normal heart sounds.  Good peripheral circulation. Respiratory: Normal respiratory effort without tachypnea nor retractions. Breath sounds are clear and equal bilaterally. No wheezes, rales, rhonchi. Gastrointestinal: Soft and nontender. Obese abdomen. No CVA tenderness. Musculoskeletal: No midline cervical, thoracic or lumbar tenderness to palpation.  Neurologic:  Normal speech and language.Speech is normal. No gait instability.  Skin:  Skin is warm, dry. Psychiatric: Mood and affect are normal. Speech and behavior are normal. Patient exhibits appropriate insight and judgment   ___________________________________________   LABS (all labs ordered are listed, but only abnormal results are displayed)  Labs Reviewed  URINALYSIS, COMPLETE (UACMP) WITH MICROSCOPIC - Abnormal; Notable for the following:       Result Value   APPearance CLOUDY (*)    Hgb urine dipstick MODERATE (*)    Protein, ur 30 (*)    Leukocytes, UA LARGE (*)    Squamous Epithelial / LPF 0-5 (*)    Bacteria, UA MANY (*)    All other components within normal limits  URINE CULTURE     PROCEDURES Procedures    INITIAL IMPRESSION / ASSESSMENT AND PLAN / ED COURSE  Pertinent labs & imaging results that were available during my care of the patient were reviewed by me and considered in my medical decision making (see chart for details).  Well-appearing patient. No acute distress. Presenting for dysuria. Urinalysis reviewed, suspect UTI. Urine culture from 2014 reviewed noting multiple  resistance to antibiotics, but sensitive to Macrobid. Will empirically start patient on oral Macrobid, and culture urine. Encourage rest, fluids, supportive care and close monitoring.Discussed indication, risks and benefits of medications with patient.   Discussed follow up with Primary care physician;States that she has PCP appointment in 2 weeks.  Discussed follow up and return parameters including no resolution or any worsening concerns. Patient verbalized understanding and agreed to plan.   ____________________________________________   FINAL CLINICAL IMPRESSION(S) / ED DIAGNOSES  Final diagnoses:  Urinary tract infection with hematuria, site unspecified     Discharge Medication List as of 12/06/2016 12:58 PM    START taking these medications   Details  nitrofurantoin, macrocrystal-monohydrate, (MACROBID) 100 MG capsule Take 1 capsule (100 mg total) by mouth 2 (two) times daily., Starting Mon 12/06/2016, Normal        Note: This dictation was prepared with Dragon dictation along with smaller phrase technology. Any transcriptional errors that result from this process are unintentional.         Renford Dills, NP 12/06/16 1307    Renford Dills, NP 12/06/16 1310

## 2016-12-06 NOTE — Discharge Instructions (Signed)
Take medication as prescribed. Rest. Drink plenty of fluids.  ° °Follow up with your primary care physician this week as needed. Return to Urgent care for new or worsening concerns.  ° °

## 2016-12-06 NOTE — ED Triage Notes (Signed)
Patient c/o burning when urinating that started on Thursday.

## 2016-12-08 ENCOUNTER — Telehealth (HOSPITAL_COMMUNITY): Payer: Self-pay | Admitting: Internal Medicine

## 2016-12-08 ENCOUNTER — Telehealth: Payer: Self-pay | Admitting: Pain Medicine

## 2016-12-08 LAB — URINE CULTURE: Culture: >=100000 — AB

## 2016-12-08 MED ORDER — CEPHALEXIN 500 MG PO CAPS: 500.0000 mg | ORAL_CAPSULE | Freq: Two times a day (BID) | ORAL | 0 refills | Status: AC

## 2016-12-08 NOTE — Telephone Encounter (Signed)
Patient states she is having pain from a recent UTI diagnosis. Instructed her to call her PCP for recommendations.

## 2016-12-08 NOTE — Telephone Encounter (Signed)
Please let patient know that urine culture was positive for E coli germ, sensitive to cephalexin but only intermediate sensitivity to nitrofurantoin rx given at the urgent care visit.  Would stop nitrofurantoin and will send rx for cephalexin to pharmacy of record, [facility] on [address] in [place].  Recheck or followup with primary care provider or urologist for further evaluation if symptoms are not improving.  LM

## 2016-12-08 NOTE — Telephone Encounter (Signed)
Patient has infection and meds are not helping with pain, what can be added to help ? Please call

## 2016-12-09 ENCOUNTER — Ambulatory Visit (INDEPENDENT_AMBULATORY_CARE_PROVIDER_SITE_OTHER): Payer: Medicaid Other | Admitting: Psychiatry

## 2016-12-09 ENCOUNTER — Encounter: Payer: Self-pay | Admitting: Psychiatry

## 2016-12-09 VITALS — BP 133/84 | HR 80 | Temp 98.8°F | Wt 325.8 lb

## 2016-12-09 DIAGNOSIS — F331 Major depressive disorder, recurrent, moderate: Secondary | ICD-10-CM

## 2016-12-09 DIAGNOSIS — F319 Bipolar disorder, unspecified: Secondary | ICD-10-CM

## 2016-12-09 DIAGNOSIS — F3130 Bipolar disorder, current episode depressed, mild or moderate severity, unspecified: Secondary | ICD-10-CM

## 2016-12-09 MED ORDER — ZOLPIDEM TARTRATE 5 MG PO TABS: 5.0000 mg | ORAL_TABLET | Freq: Every day | ORAL | 3 refills | Status: DC

## 2016-12-09 MED ORDER — QUETIAPINE FUMARATE 100 MG PO TABS: 300.0000 mg | ORAL_TABLET | Freq: Every day | ORAL | 0 refills | Status: DC

## 2016-12-09 MED ORDER — BUSPIRONE HCL 10 MG PO TABS: 10.0000 mg | ORAL_TABLET | Freq: Two times a day (BID) | ORAL | 3 refills | Status: DC

## 2016-12-09 MED ORDER — FLUOXETINE HCL 20 MG PO CAPS: 20.0000 mg | ORAL_CAPSULE | Freq: Every day | ORAL | 3 refills | Status: DC

## 2016-12-09 NOTE — Progress Notes (Signed)
Follow-up note for 56 year old woman with a history of depression probably bipolar 2. She comes to her appointment today saying she is feeling much worse. Mood is sad and depressed. Tearful much of the time. She feels like about a month ago she just suddenly plunged into a depression. Sleeping poorly at night. Energy poor. She says she is consumed by intrusive thoughts that everyone in her family is going to die even though she knows that this doesn't really make any sense. She is not having any suicidal thoughts. She has not changed any of her medicines or had any new medicines or new major stressors.  Neatly dressed and cooperative. Intermittent eye contact. Tearful much of the interview but controlled. Having intrusive negative thoughts but has some control over them. No suicidal or homicidal ideation or plan.  We reviewed her medicine and her past history including the use of Abilify about a year ago. I suggested to her that Seroquel would be a good option to try adding with a possible cross taper for bipolar depression. Side effects explained patient agrees. I called into the pharmacy Seroquel 100 mg with a requested plan that she do one of them at night for 2 days then 2 of them at night for 2 days and then 3 of them at night. Hopefully they will be able to supply her the correct number. Meanwhile decrease the Prozac to 20 mg a day continue the other medicines. Follow-up 2 weeks

## 2016-12-13 ENCOUNTER — Ambulatory Visit
Admission: RE | Admit: 2016-12-13 | Discharge: 2016-12-13 | Disposition: A | Discharge status: Home / Self Care | Payer: MEDICAID

## 2016-12-13 DIAGNOSIS — M069 Rheumatoid arthritis, unspecified: Principal | ICD-10-CM

## 2016-12-22 ENCOUNTER — Ambulatory Visit
Admission: RE | Admit: 2016-12-22 | Discharge: 2016-12-22 | Disposition: A | Discharge status: Home / Self Care | Payer: MEDICAID | Attending: Family | Admitting: Family

## 2016-12-22 DIAGNOSIS — I5032 Chronic diastolic (congestive) heart failure: Secondary | ICD-10-CM

## 2016-12-22 DIAGNOSIS — J449 Chronic obstructive pulmonary disease, unspecified: Secondary | ICD-10-CM

## 2016-12-22 DIAGNOSIS — G629 Polyneuropathy, unspecified: Secondary | ICD-10-CM

## 2016-12-22 DIAGNOSIS — Z794 Long term (current) use of insulin: Secondary | ICD-10-CM

## 2016-12-22 DIAGNOSIS — F331 Major depressive disorder, recurrent, moderate: Secondary | ICD-10-CM

## 2016-12-22 DIAGNOSIS — K219 Gastro-esophageal reflux disease without esophagitis: Secondary | ICD-10-CM

## 2016-12-22 DIAGNOSIS — M069 Rheumatoid arthritis, unspecified: Secondary | ICD-10-CM

## 2016-12-22 DIAGNOSIS — E1165 Type 2 diabetes mellitus with hyperglycemia: Principal | ICD-10-CM

## 2016-12-22 DIAGNOSIS — E039 Hypothyroidism, unspecified: Secondary | ICD-10-CM

## 2016-12-22 DIAGNOSIS — G894 Chronic pain syndrome: Secondary | ICD-10-CM

## 2016-12-22 MED ORDER — POTASSIUM CHLORIDE ER 20 MEQ TABLET,EXTENDED RELEASE(PART/CRYST)
ORAL_TABLET | Freq: Every day | ORAL | 1 refills | 0 days | Status: CP
Start: 2016-12-22 — End: 2017-12-26

## 2016-12-23 ENCOUNTER — Encounter: Payer: Self-pay | Admitting: Psychiatry

## 2016-12-23 ENCOUNTER — Ambulatory Visit (INDEPENDENT_AMBULATORY_CARE_PROVIDER_SITE_OTHER): Payer: Medicaid Other | Admitting: Psychiatry

## 2016-12-23 VITALS — BP 113/80 | HR 94 | Temp 99.3°F | Wt 331.8 lb

## 2016-12-23 DIAGNOSIS — F331 Major depressive disorder, recurrent, moderate: Secondary | ICD-10-CM

## 2016-12-23 DIAGNOSIS — F411 Generalized anxiety disorder: Secondary | ICD-10-CM

## 2016-12-23 MED ORDER — QUETIAPINE FUMARATE 100 MG PO TABS: 300.0000 mg | ORAL_TABLET | Freq: Every day | ORAL | 3 refills | Status: DC

## 2016-12-23 NOTE — Progress Notes (Signed)
Follow-up for 56 year old woman with a history of bipolar depression. She comes back today saying she is feeling much better. No longer having suicidal thoughts or frequent crying fits. Still very socially isolated but trying to get out more. Attending church more frequently. Sleeping better at night. Tolerating medicine.  Neatly dressed and groomed woman looks her stated age. Affect calmer not tearful. Thoughts are lucid no evidence of loosening of associations. Denies suicidal or homicidal thought. No evidence of psychosis.  Reviewed medication plan. Continue all medicines as currently prescribed and I will see her back in another 4 weeks for reevaluation and follow up at that time. Encouraged her to continue optimistically working on getting out of the house attending more social activities and interacting with her family.

## 2016-12-27 ENCOUNTER — Other Ambulatory Visit: Payer: Self-pay | Admitting: Psychiatry

## 2016-12-29 NOTE — Telephone Encounter (Signed)
pt called states that she called the pharmacy and they stated that they did not get the seroquel. pt was seen on  [phone] next appt 01-20-17   Medication Detail    Disp Refills Start End   QUEtiapine (SEROQUEL) 100 MG tablet 90 tablet 3 12/23/2016    Sig - Route: Take 3 tablets (300 mg total) by mouth at bedtime. - Oral   Class: Phone In

## 2016-12-30 ENCOUNTER — Ambulatory Visit: Payer: Medicaid Other | Admitting: Psychiatry

## 2017-01-10 NOTE — Telephone Encounter (Signed)
QUEtiapine (SEROQUEL) 100 MG tablet  Medication  Date: 12/29/2016 Department: [facility] Ordering/Authorizing: Clapacs, Jackquline Denmark, MD  Order Providers   Prescribing Provider Encounter Provider  Clapacs, Jackquline Denmark, MD Clapacs, Jackquline Denmark, MD  Medication Detail    Disp Refills Start End   QUEtiapine (SEROQUEL) 100 MG tablet 45 tablet 0 12/29/2016    Sig: TAKE 1 TABLET BY MOUTH AT NIGHT FOR 2 DAYS THEN 2 TABS AT NIGHT FOR 2 DAYS THEN 3 TABS AT NIGHT   Sent to pharmacy as: QUEtiapine (SEROQUEL) 100 MG tablet   E-Prescribing Status: Receipt confirmed by pharmacy (12/29/2016 6:34 PM EDT)

## 2017-01-20 ENCOUNTER — Encounter: Payer: Self-pay | Admitting: Psychiatry

## 2017-01-20 ENCOUNTER — Ambulatory Visit (INDEPENDENT_AMBULATORY_CARE_PROVIDER_SITE_OTHER): Payer: Medicaid Other | Admitting: Psychiatry

## 2017-01-20 ENCOUNTER — Ambulatory Visit
Admission: RE | Admit: 2017-01-20 | Discharge: 2017-01-20 | Disposition: A | Discharge status: Home / Self Care | Payer: MEDICAID

## 2017-01-20 VITALS — BP 127/80 | HR 91 | Temp 98.6°F | Wt 338.8 lb

## 2017-01-20 DIAGNOSIS — M069 Rheumatoid arthritis, unspecified: Principal | ICD-10-CM

## 2017-01-20 DIAGNOSIS — F331 Major depressive disorder, recurrent, moderate: Secondary | ICD-10-CM | POA: Diagnosis not present

## 2017-01-20 DIAGNOSIS — F411 Generalized anxiety disorder: Secondary | ICD-10-CM | POA: Diagnosis not present

## 2017-01-20 MED ORDER — QUETIAPINE FUMARATE 300 MG PO TABS: 300.0000 mg | ORAL_TABLET | Freq: Every day | ORAL | 3 refills | Status: DC

## 2017-01-20 MED ORDER — FLUOXETINE HCL 20 MG PO CAPS: 20.0000 mg | ORAL_CAPSULE | Freq: Every day | ORAL | 3 refills | Status: DC

## 2017-01-20 MED ORDER — BUSPIRONE HCL 10 MG PO TABS: 10.0000 mg | ORAL_TABLET | Freq: Two times a day (BID) | ORAL | 3 refills | Status: DC

## 2017-01-20 NOTE — Progress Notes (Signed)
Follow-up patient with depression possible bipolar depression. She is feeling significantly better than she was last time we spoke. Less tearful less anxious. Sleeping better. Able to discuss appropriate positive plans for the future. Not having any suicidal thoughts.  Neatly dressed and groomed. Appropriate affect. Good eye contact. Speech normal rate tone and volume. Thoughts lucid no suicidal thought.  Supportive therapy. Review medication. Change the Seroquel to 300 mg strength at night and continue current medicine follow-up in 3 months.

## 2017-02-09 ENCOUNTER — Ambulatory Visit
Admission: RE | Admit: 2017-02-09 | Discharge: 2017-02-09 | Disposition: A | Discharge status: Home / Self Care | Payer: Medicaid Other | Source: Ambulatory Visit | Attending: Nurse Practitioner | Admitting: Nurse Practitioner

## 2017-02-09 ENCOUNTER — Ambulatory Visit: Payer: Medicaid Other | Attending: Nurse Practitioner | Admitting: Nurse Practitioner

## 2017-02-09 ENCOUNTER — Encounter: Payer: Self-pay | Admitting: Nurse Practitioner

## 2017-02-09 VITALS — BP 122/83 | HR 80 | Temp 98.0°F | Resp 18 | Ht 66.0 in | Wt 350.0 lb

## 2017-02-09 DIAGNOSIS — M79673 Pain in unspecified foot: Secondary | ICD-10-CM

## 2017-02-09 DIAGNOSIS — M79643 Pain in unspecified hand: Secondary | ICD-10-CM

## 2017-02-09 DIAGNOSIS — M792 Neuralgia and neuritis, unspecified: Secondary | ICD-10-CM | POA: Diagnosis not present

## 2017-02-09 DIAGNOSIS — Z6841 Body Mass Index (BMI) 40.0 and over, adult: Secondary | ICD-10-CM | POA: Insufficient documentation

## 2017-02-09 DIAGNOSIS — K219 Gastro-esophageal reflux disease without esophagitis: Secondary | ICD-10-CM | POA: Insufficient documentation

## 2017-02-09 DIAGNOSIS — G894 Chronic pain syndrome: Secondary | ICD-10-CM

## 2017-02-09 DIAGNOSIS — G4700 Insomnia, unspecified: Secondary | ICD-10-CM | POA: Diagnosis not present

## 2017-02-09 DIAGNOSIS — E1165 Type 2 diabetes mellitus with hyperglycemia: Secondary | ICD-10-CM | POA: Diagnosis not present

## 2017-02-09 DIAGNOSIS — E1140 Type 2 diabetes mellitus with diabetic neuropathy, unspecified: Secondary | ICD-10-CM | POA: Insufficient documentation

## 2017-02-09 DIAGNOSIS — F603 Borderline personality disorder: Secondary | ICD-10-CM | POA: Insufficient documentation

## 2017-02-09 DIAGNOSIS — G8929 Other chronic pain: Secondary | ICD-10-CM | POA: Diagnosis not present

## 2017-02-09 DIAGNOSIS — M25532 Pain in left wrist: Secondary | ICD-10-CM | POA: Insufficient documentation

## 2017-02-09 DIAGNOSIS — M25529 Pain in unspecified elbow: Secondary | ICD-10-CM

## 2017-02-09 DIAGNOSIS — M25511 Pain in right shoulder: Secondary | ICD-10-CM | POA: Insufficient documentation

## 2017-02-09 DIAGNOSIS — E039 Hypothyroidism, unspecified: Secondary | ICD-10-CM | POA: Diagnosis not present

## 2017-02-09 DIAGNOSIS — M25522 Pain in left elbow: Secondary | ICD-10-CM | POA: Diagnosis not present

## 2017-02-09 DIAGNOSIS — Z79891 Long term (current) use of opiate analgesic: Secondary | ICD-10-CM | POA: Diagnosis not present

## 2017-02-09 DIAGNOSIS — I5032 Chronic diastolic (congestive) heart failure: Secondary | ICD-10-CM | POA: Diagnosis not present

## 2017-02-09 DIAGNOSIS — M542 Cervicalgia: Secondary | ICD-10-CM | POA: Insufficient documentation

## 2017-02-09 DIAGNOSIS — M069 Rheumatoid arthritis, unspecified: Secondary | ICD-10-CM | POA: Insufficient documentation

## 2017-02-09 DIAGNOSIS — E876 Hypokalemia: Secondary | ICD-10-CM | POA: Diagnosis not present

## 2017-02-09 DIAGNOSIS — J449 Chronic obstructive pulmonary disease, unspecified: Secondary | ICD-10-CM | POA: Insufficient documentation

## 2017-02-09 DIAGNOSIS — N3946 Mixed incontinence: Secondary | ICD-10-CM | POA: Insufficient documentation

## 2017-02-09 DIAGNOSIS — Z79899 Other long term (current) drug therapy: Secondary | ICD-10-CM | POA: Insufficient documentation

## 2017-02-09 DIAGNOSIS — I110 Hypertensive heart disease with heart failure: Secondary | ICD-10-CM | POA: Insufficient documentation

## 2017-02-09 DIAGNOSIS — E7801 Familial hypercholesterolemia: Secondary | ICD-10-CM | POA: Diagnosis not present

## 2017-02-09 DIAGNOSIS — Z794 Long term (current) use of insulin: Secondary | ICD-10-CM | POA: Insufficient documentation

## 2017-02-09 DIAGNOSIS — Z7952 Long term (current) use of systemic steroids: Secondary | ICD-10-CM | POA: Insufficient documentation

## 2017-02-09 DIAGNOSIS — M25521 Pain in right elbow: Secondary | ICD-10-CM | POA: Diagnosis not present

## 2017-02-09 DIAGNOSIS — E1142 Type 2 diabetes mellitus with diabetic polyneuropathy: Secondary | ICD-10-CM | POA: Diagnosis not present

## 2017-02-09 DIAGNOSIS — M25531 Pain in right wrist: Secondary | ICD-10-CM | POA: Diagnosis not present

## 2017-02-09 DIAGNOSIS — M25512 Pain in left shoulder: Secondary | ICD-10-CM | POA: Insufficient documentation

## 2017-02-09 DIAGNOSIS — F331 Major depressive disorder, recurrent, moderate: Secondary | ICD-10-CM | POA: Diagnosis not present

## 2017-02-09 DIAGNOSIS — F419 Anxiety disorder, unspecified: Secondary | ICD-10-CM | POA: Insufficient documentation

## 2017-02-09 DIAGNOSIS — R350 Frequency of micturition: Secondary | ICD-10-CM | POA: Insufficient documentation

## 2017-02-09 MED ORDER — PREGABALIN 150 MG PO CAPS: 150.0000 mg | ORAL_CAPSULE | Freq: Three times a day (TID) | ORAL | 0 refills | Status: DC

## 2017-02-09 MED ORDER — OXYCODONE HCL 5 MG PO TABS: 5.0000 mg | ORAL_TABLET | Freq: Four times a day (QID) | ORAL | 0 refills | Status: DC | PRN

## 2017-02-09 NOTE — Patient Instructions (Addendum)
____________________________________________________________________________________________  Medication Rules  Applies to: All patients receiving prescriptions (written or electronic).  Pharmacy of record: Pharmacy where electronic prescriptions will be sent. If written prescriptions are taken to a different pharmacy, please inform the nursing staff. The pharmacy listed in the electronic medical record should be the one where you would like electronic prescriptions to be sent.  Prescription refills: Only during scheduled appointments. Applies to both, written and electronic prescriptions.  NOTE: The following applies primarily to controlled substances (Opioid* Pain Medications).   Patient's responsibilities: 1. Pain Pills: Bring all pain pills to every appointment (except for procedure appointments). 2. Pill Bottles: Bring pills in original pharmacy bottle. Always bring newest bottle. Bring bottle, even if empty. 3. Medication refills: You are responsible for knowing and keeping track of what medications you need refilled. The day before your appointment, write a list of all prescriptions that need to be refilled. Bring that list to your appointment and give it to the admitting nurse. Prescriptions will be written only during appointments. If you forget a medication, it will not be "Called in", "Faxed", or "electronically sent". You will need to get another appointment to get these prescribed. 4. Prescription Accuracy: You are responsible for carefully inspecting your prescriptions before leaving our office. Have the discharge nurse carefully go over each prescription with you, before taking them home. Make sure that your name is accurately spelled, that your address is correct. Check the name and dose of your medication to make sure it is accurate. Check the number of pills, and the written instructions to make sure they are clear and accurate. Make sure that you are given enough medication to  last until your next medication refill appointment. 5. Taking Medication: Take medication as prescribed. Never take more pills than instructed. Never take medication more frequently than prescribed. Taking less pills or less frequently is permitted and encouraged, when it comes to controlled substances (written prescriptions).  6. Inform other Doctors: Always inform, all of your healthcare providers, of all the medications you take. 7. Pain Medication from other Providers: You are not allowed to accept any additional pain medication from any other Doctor or Healthcare provider. There are two exceptions to this rule. (see below) In the event that you require additional pain medication, you are responsible for notifying us, as stated below. 8. Medication Agreement: You are responsible for carefully reading and following our Medication Agreement. This must be signed before receiving any prescriptions from our practice. Safely store a copy of your signed Agreement. Violations to the Agreement will result in no further prescriptions. (Additional copies of our Medication Agreement are available upon request.) 9. Laws, Rules, & Regulations: All patients are expected to follow all Federal and State Laws, Statutes, Rules, & Regulations. Ignorance of the Laws does not constitute a valid excuse. The use of any illegal substances is prohibited. 10. Adopted CDC guidelines & recommendations: Target dosing levels will be at or below 60 MME/day. Use of benzodiazepines** is not recommended.  Exceptions: There are only two exceptions to the rule of not receiving pain medications from other Healthcare Providers. 1. Exception #1 (Emergencies): In the event of an emergency (i.e.: accident requiring emergency care), you are allowed to receive additional pain medication. However, you are responsible for: As soon as you are able, call our office [phone], at any time of the day or night, and leave a message stating your  name, the date and nature of the emergency, and the name and dose of the medication   prescribed. In the event that your call is answered by a member of our staff, make sure to document and save the date, time, and the name of the person that took your information.  2. Exception #2 (Planned Surgery): In the event that you are scheduled by another doctor or dentist to have any type of surgery or procedure, you are allowed (for a period no longer than 30 days), to receive additional pain medication, for the acute post-op pain. However, in this case, you are responsible for picking up a copy of our "Post-op Pain Management for Surgeons" handout, and giving it to your surgeon or dentist. This document is available at our office, and does not require an appointment to obtain it. Simply go to our office during business hours (Monday-Thursday from 8:00 AM to 4:00 PM) (Friday 8:00 AM to 12:00 Noon) or if you have a scheduled appointment with [name], prior to your surgery, and ask for it by name. In addition, you will need to provide [name] with your name, name of your surgeon, type of surgery, and date of procedure or surgery.  *Opioid medications include: morphine, codeine, oxycodone, oxymorphone, hydrocodone, hydromorphone, meperidine, tramadol, tapentadol, buprenorphine, fentanyl, methadone. **Benzodiazepine medications include: diazepam (Valium), alprazolam (Xanax), clonazepam (Klonopine), lorazepam (Ativan), clorazepate (Tranxene), chlordiazepoxide (Librium), estazolam (Prosom), oxazepam (Serax), temazepam (Restoril), triazolam (Halcion)  ____________________________________________________________________________________________  BMI Assessment: Estimated body mass index is 56.49 kg/m as calculated from the following:   Height as of this encounter: 5\' 6"  (1.676 m).   Weight as of this encounter: 350 lb (158.8 kg).  BMI interpretation table: BMI level Category Range association with higher incidence of chronic pain   <18 kg/m2 Underweight   18.5-24.9 kg/m2 Ideal body weight   25-29.9 kg/m2 Overweight Increased incidence by 20%  30-34.9 kg/m2 Obese (Class I) Increased incidence by 68%  35-39.9 kg/m2 Severe obesity (Class II) Increased incidence by 136%  >40 kg/m2 Extreme obesity (Class III) Increased incidence by 254%   BMI Readings from Last 4 Encounters:  02/09/17 56.49 kg/m  12/06/16 52.46 kg/m  11/10/16 51.33 kg/m  08/19/16 50.20 kg/m   Wt Readings from Last 4 Encounters:  02/09/17 (!) 350 lb (158.8 kg)  12/06/16 (!) 325 lb (147.4 kg)  11/10/16 (!) 318 lb (144.2 kg)  08/19/16 (!) 311 lb (141.1 kg)

## 2017-02-09 NOTE — Progress Notes (Signed)
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 prescription filled one day early if pharmacy is closed on scheduled refill date. Do not fill until: 04/29/2017 To last until:05/29/2017    Order Specific Question:   Supervising Provider    Answer:   Milinda Pointer [phone]   New Prescriptions   OXYCODONE (OXY IR/ROXICODONE) 5 MG IMMEDIATE RELEASE TABLET    Take 1 tablet (5 mg total) by mouth every 6 (six) hours as needed for severe pain.   Medications administered today: [name] [name] had no medications administered during this visit. Lab-work, procedure(s), and/or referral(s): Orders Placed This Encounter  Procedures  . LIDOCAINE INFUSION  . ToxASSURE Select 13 (MW), Urine  . EKG 12-Lead   Imaging and/or referral(s): None  Interventional therapies: Planned, scheduled, and/or pending:  Not at this time.   Considering:  IV lidocaine infusions.  Diagnostic bilateral lumbar  sympathetic block  Intra-articular injection with local anesthetic and steroids.  Diagnostic bilateral intra-articular shoulder joint injection  Diagnostic bilateral intra-articular knee injections with local anesthetic and steroid  Diagnostic right-sided cervical epidural steroid injections  Diagnostic bilateral Cervicalfacet block  Possible bilateral cervical facet radiofrequency ablation.    Palliative PRN treatment(s):  Palliative IV lidocaine infusion   Provider-requested follow-up: Return in about 3 months (around 05/12/2017) for MedMgmt.  Future Appointments Date Time Provider [place]  02/14/2017 9:15 AM Vevelyn Francois, NP [facility] None  04/21/2017 1:40 PM Clapacs, Madie [place], MD [facility] None  05/12/2017 11:00 AM Vevelyn Francois, NP [facility] None   Primary Care Physician: [facility] [place] Location: [facility] Note by: Vevelyn Francois NP Date: 02/09/2017; Time: 10:12 AM  Pain Score Disclaimer: We use the NRS-11 scale. This is a self-reported, subjective measurement of pain severity with only modest accuracy. It is used primarily to identify changes within a particular patient. It must be understood that outpatient pain scales are significantly less accurate that those used for research, where they can be applied under ideal controlled circumstances with minimal exposure to variables. In reality, the score is likely to be a combination of pain intensity and pain affect, where pain affect describes the degree of emotional arousal or changes in action readiness caused by the sensory experience of pain. Factors such as social and work situation, setting, emotional state, anxiety levels, expectation, and prior pain experience may influence pain perception and show large inter-individual differences that may also be affected by time variables.  Patient instructions provided during this appointment: Patient Instructions     ____________________________________________________________________________________________  Medication Rules  Applies to: All patients receiving prescriptions (written or electronic).  Pharmacy of record: Pharmacy where electronic prescriptions will be sent. If  written prescriptions are taken to a different pharmacy, please inform the nursing staff. The pharmacy listed in the electronic medical record should be the one where you would like electronic prescriptions to be sent.  Prescription refills: Only during scheduled appointments. Applies to both, written and electronic prescriptions.  NOTE: The following applies primarily to controlled substances (Opioid* Pain Medications).   Patient's responsibilities: 1. Pain Pills: Bring all pain pills to every appointment (except for procedure appointments). 2. Pill Bottles: Bring pills in original pharmacy bottle. Always bring newest bottle. Bring bottle, even if empty. 3. Medication refills: You are responsible for knowing and keeping track of what medications you need refilled. The day before your appointment, write a list of all prescriptions that need to be refilled. Bring that list to your appointment and give it to the admitting nurse. Prescriptions will be written only during appointments. If you forget a medication, it will not be "Called in", "Faxed", or "electronically sent". You will need to get another appointment to get these prescribed. 4. Prescription Accuracy: You are responsible for carefully inspecting your prescriptions before leaving our office. Have the discharge nurse carefully go over each prescription with you, before taking them home. Make sure that your name is accurately spelled, that your address is correct. Check the name and dose of your medication to make sure it is accurate. Check the number of pills, and the written instructions to make sure they are clear and accurate. Make sure that you are given enough medication  to last until your next medication refill appointment. 5. Taking Medication: Take medication as prescribed. Never take more pills than instructed. Never take medication more frequently than prescribed. Taking less pills or less frequently is permitted and encouraged, when it comes to controlled substances (written prescriptions).  6. Inform other Doctors: Always inform, all of your healthcare providers, of all the medications you take. 7. Pain Medication from other Providers: You are not allowed to accept any additional pain medication from any other Doctor or Healthcare provider. There are two exceptions to this rule. (see below) In the event that you require additional pain medication, you are responsible for notifying us, as stated below. 8. Medication Agreement: You are responsible for carefully reading and following our Medication Agreement. This must be signed before receiving any prescriptions from our practice. Safely store a copy of your signed Agreement. Violations to the Agreement will result in no further prescriptions. (Additional copies of our Medication Agreement are available upon request.) 9. Laws, Rules, & Regulations: All patients are expected to follow all Federal and [employer], Rules, [employer]. Ignorance of the Laws does not constitute a valid excuse. The use of any illegal substances is prohibited. 10. Adopted CDC guidelines & recommendations: Target dosing levels will be at or below 60 MME/day. Use of benzodiazepines** is not recommended.  Exceptions: There are only two exceptions to the rule of not receiving pain medications from other Healthcare Providers. 1. Exception #1 (Emergencies): In the event of an emergency (i.e.: accident requiring emergency care), you are allowed to receive additional pain medication. However, you are responsible for: As soon as you are able, call our office [phone], at any time of the day or night, and leave a message stating your  name, the date and nature of the emergency, and the name and dose of the medication prescribed. In the event that your call is answered by a member of our staff, make sure to document and save the date, time, and the  name of the person that took your information.  2. Exception #2 (Planned Surgery): In the event that you are scheduled by another doctor or dentist to have any type of surgery or procedure, you are allowed (for a period no longer than 30 days), to receive additional pain medication, for the acute post-op pain. However, in this case, you are responsible for picking up a copy of our "Post-op Pain Management for Surgeons" handout, and giving it to your surgeon or dentist. This document is available at our office, and does not require an appointment to obtain it. Simply go to our office during business hours (Monday-Thursday from 8:00 AM to 4:00 PM) (Friday 8:00 AM to 12:00 Noon) or if you have a scheduled appointment with [name], prior to your surgery, and ask for it by name. In addition, you will need to provide [name] with your name, name of your surgeon, type of surgery, and date of procedure or surgery.  *Opioid medications include: morphine, codeine, oxycodone, oxymorphone, hydrocodone, hydromorphone, meperidine, tramadol, tapentadol, buprenorphine, fentanyl, methadone. **Benzodiazepine medications include: diazepam (Valium), alprazolam (Xanax), clonazepam (Klonopine), lorazepam (Ativan), clorazepate (Tranxene), chlordiazepoxide (Librium), estazolam (Prosom), oxazepam (Serax), temazepam (Restoril), triazolam (Halcion)  ____________________________________________________________________________________________  BMI Assessment: Estimated body mass index is 56.49 kg/m as calculated from the following:   Height as of this encounter: 5' 6"  (1.676 m).   Weight as of this encounter: 350 lb (158.8 kg).  BMI interpretation table: BMI level Category Range association with higher incidence of chronic pain   <18 kg/m2 Underweight   18.5-24.9 kg/m2 Ideal body weight   25-29.9 kg/m2 Overweight Increased incidence by 20%  30-34.9 kg/m2 Obese (Class I) Increased incidence by 68%  35-39.9 kg/m2 Severe obesity (Class II) Increased incidence by 136%  >40 kg/m2 Extreme obesity (Class III) Increased incidence by 254%   BMI Readings from Last 4 Encounters:  02/09/17 56.49 kg/m  12/06/16 52.46 kg/m  11/10/16 51.33 kg/m  08/19/16 50.20 kg/m   Wt Readings from Last 4 Encounters:  02/09/17 (!) 350 lb (158.8 kg)  12/06/16 (!) 325 lb (147.4 kg)  11/10/16 (!) 318 lb (144.2 kg)  08/19/16 (!) 311 lb (141.1 kg)

## 2017-02-09 NOTE — Progress Notes (Signed)
Nursing Pain Medication Assessment:  Safety precautions to be maintained throughout the outpatient stay will include: orient to surroundings, keep bed in low position, maintain call bell within reach at all times, provide assistance with transfer out of bed and ambulation.  Medication Inspection Compliance: Pill count conducted under aseptic conditions, in front of the patient. Neither the pills nor the bottle was removed from the patient's sight at any time. Once count was completed pills were immediately returned to the patient in their original bottle.  Medication: Oxycodone IR Pill/Patch Count: 75 of 120 pills remain Pill/Patch Appearance: Markings consistent with prescribed medication Bottle Appearance: Standard pharmacy container. Clearly labeled. Filled Date:10/13/ 2018 Last Medication intake:  Yesterday

## 2017-02-14 ENCOUNTER — Other Ambulatory Visit: Payer: Self-pay

## 2017-02-14 ENCOUNTER — Ambulatory Visit: Payer: Medicaid Other | Attending: Nurse Practitioner | Admitting: Nurse Practitioner

## 2017-02-14 ENCOUNTER — Encounter: Payer: Self-pay | Admitting: Nurse Practitioner

## 2017-02-14 VITALS — BP 123/64 | HR 88 | Temp 98.8°F | Resp 14 | Ht 66.0 in | Wt 343.0 lb

## 2017-02-14 DIAGNOSIS — Z79899 Other long term (current) drug therapy: Secondary | ICD-10-CM | POA: Insufficient documentation

## 2017-02-14 DIAGNOSIS — M25562 Pain in left knee: Secondary | ICD-10-CM | POA: Insufficient documentation

## 2017-02-14 DIAGNOSIS — E1142 Type 2 diabetes mellitus with diabetic polyneuropathy: Secondary | ICD-10-CM | POA: Diagnosis not present

## 2017-02-14 DIAGNOSIS — Z888 Allergy status to other drugs, medicaments and biological substances status: Secondary | ICD-10-CM | POA: Insufficient documentation

## 2017-02-14 DIAGNOSIS — Z9049 Acquired absence of other specified parts of digestive tract: Secondary | ICD-10-CM | POA: Diagnosis not present

## 2017-02-14 DIAGNOSIS — Z882 Allergy status to sulfonamides status: Secondary | ICD-10-CM | POA: Diagnosis not present

## 2017-02-14 DIAGNOSIS — G894 Chronic pain syndrome: Secondary | ICD-10-CM

## 2017-02-14 DIAGNOSIS — Z90710 Acquired absence of both cervix and uterus: Secondary | ICD-10-CM | POA: Diagnosis not present

## 2017-02-14 DIAGNOSIS — Z79891 Long term (current) use of opiate analgesic: Secondary | ICD-10-CM | POA: Diagnosis not present

## 2017-02-14 DIAGNOSIS — M0589 Other rheumatoid arthritis with rheumatoid factor of multiple sites: Secondary | ICD-10-CM | POA: Insufficient documentation

## 2017-02-14 DIAGNOSIS — Z9884 Bariatric surgery status: Secondary | ICD-10-CM | POA: Diagnosis not present

## 2017-02-14 DIAGNOSIS — Z7984 Long term (current) use of oral hypoglycemic drugs: Secondary | ICD-10-CM | POA: Insufficient documentation

## 2017-02-14 DIAGNOSIS — M25532 Pain in left wrist: Secondary | ICD-10-CM | POA: Diagnosis present

## 2017-02-14 DIAGNOSIS — Z885 Allergy status to narcotic agent status: Secondary | ICD-10-CM | POA: Insufficient documentation

## 2017-02-14 DIAGNOSIS — M792 Neuralgia and neuritis, unspecified: Secondary | ICD-10-CM | POA: Diagnosis not present

## 2017-02-14 DIAGNOSIS — M79642 Pain in left hand: Secondary | ICD-10-CM | POA: Insufficient documentation

## 2017-02-14 DIAGNOSIS — M0579 Rheumatoid arthritis with rheumatoid factor of multiple sites without organ or systems involvement: Secondary | ICD-10-CM

## 2017-02-14 MED ORDER — MIDAZOLAM HCL 5 MG/5ML IJ SOLN
1.0000 mg | INTRAMUSCULAR | Status: DC | PRN
  Administered 2017-02-14: 1 mg via INTRAVENOUS

## 2017-02-14 MED ORDER — MIDAZOLAM HCL 5 MG/5ML IJ SOLN
INTRAMUSCULAR | Status: AC
  Filled 2017-02-14: qty 5

## 2017-02-14 MED ORDER — LIDOCAINE IN D5W 4-5 MG/ML-% IV SOLN
INTRAVENOUS | Status: AC
  Filled 2017-02-14: qty 500

## 2017-02-14 MED ORDER — LACTATED RINGERS IV SOLN
1000.0000 mL | INTRAVENOUS | Status: DC
  Administered 2017-02-14: 1000 mL via INTRAVENOUS

## 2017-02-14 MED ORDER — LIDOCAINE IN D5W 4-5 MG/ML-% IV SOLN
4.0000 mg/min | INTRAVENOUS | Status: AC
  Administered 2017-02-14: 4 mg/min via INTRAVENOUS

## 2017-02-14 NOTE — Patient Instructions (Signed)
____________________________________________________________________________________________  Pain Scale  Introduction: The pain score used by this practice is the Verbal Numerical Rating Scale (VNRS-11). This is an 11-point scale. It is for adults and children 10 years or older. There are significant differences in how the pain score is reported, used, and applied. Forget everything you learned in the past and learn this scoring system.  General Information: The scale should reflect your current level of pain. Unless you are specifically asked for the level of your worst pain, or your average pain. If you are asked for one of these two, then it should be understood that it is over the past 24 hours.  Basic Activities of Daily Living (ADL): Personal hygiene, dressing, eating, transferring, and using restroom.  Instructions: Most patients tend to report their level of pain as a combination of two factors, their physical pain and their psychosocial pain. This last one is also known as "suffering" and it is reflection of how physical pain affects you socially and psychologically. From now on, report them separately. From this point on, when asked to report your pain level, report only your physical pain. Use the following table for reference.  Pain Clinic Pain Levels (0-5/10)  Pain Level Score  Description  No Pain 0   Mild pain 1 Nagging, annoying, but does not interfere with basic activities of daily living (ADL). Patients are able to eat, bathe, get dressed, toileting (being able to get on and off the toilet and perform personal hygiene functions), transfer (move in and out of bed or a chair without assistance), and maintain continence (able to control bladder and bowel functions). Blood pressure and heart rate are unaffected. A normal heart rate for a healthy adult ranges from 60 to 100 bpm (beats per minute).   Mild to moderate pain 2 Noticeable and distracting. Impossible to hide from other  people. More frequent flare-ups. Still possible to adapt and function close to normal. It can be very annoying and may have occasional stronger flare-ups. With discipline, patients may get used to it and adapt.   Moderate pain 3 Interferes significantly with activities of daily living (ADL). It becomes difficult to feed, bathe, get dressed, get on and off the toilet or to perform personal hygiene functions. Difficult to get in and out of bed or a chair without assistance. Very distracting. With effort, it can be ignored when deeply involved in activities.   Moderately severe pain 4 Impossible to ignore for more than a few minutes. With effort, patients may still be able to manage work or participate in some social activities. Very difficult to concentrate. Signs of autonomic nervous system discharge are evident: dilated pupils (mydriasis); mild sweating (diaphoresis); sleep interference. Heart rate becomes elevated (>115 bpm). Diastolic blood pressure (lower number) rises above 100 mmHg. Patients find relief in laying down and not moving.   Severe pain 5 Intense and extremely unpleasant. Associated with frowning face and frequent crying. Pain overwhelms the senses.  Ability to do any activity or maintain social relationships becomes significantly limited. Conversation becomes difficult. Pacing back and forth is common, as getting into a comfortable position is nearly impossible. Pain wakes you up from deep sleep. Physical signs will be obvious: pupillary dilation; increased sweating; goosebumps; brisk reflexes; cold, clammy hands and feet; nausea, vomiting or dry heaves; loss of appetite; significant sleep disturbance with inability to fall asleep or to remain asleep. When persistent, significant weight loss is observed due to the complete loss of appetite and sleep deprivation.  Blood   pressure and heart rate becomes significantly elevated. Caution: If elevated blood pressure triggers a pounding headache  associated with blurred vision, then the patient should immediately seek attention at an urgent or emergency care unit, as these may be signs of an impending stroke.    Emergency Department Pain Levels (6-10/10)  Emergency Room Pain 6 Severely limiting. Requires emergency care and should not be seen or managed at an outpatient pain management facility. Communication becomes difficult and requires great effort. Assistance to reach the emergency department may be required. Facial flushing and profuse sweating along with potentially dangerous increases in heart rate and blood pressure will be evident.   Distressing pain 7 Self-care is very difficult. Assistance is required to transport, or use restroom. Assistance to reach the emergency department will be required. Tasks requiring coordination, such as bathing and getting dressed become very difficult.   Disabling pain 8 Self-care is no longer possible. At this level, pain is disabling. The individual is unable to do even the most "basic" activities such as walking, eating, bathing, dressing, transferring to a bed, or toileting. Fine motor skills are lost. It is difficult to think clearly.   Incapacitating pain 9 Pain becomes incapacitating. Thought processing is no longer possible. Difficult to remember your own name. Control of movement and coordination are lost.   The worst pain imaginable 10 At this level, most patients pass out from pain. When this level is reached, collapse of the autonomic nervous system occurs, leading to a sudden drop in blood pressure and heart rate. This in turn results in a temporary and dramatic drop in blood flow to the brain, leading to a loss of consciousness. Fainting is one of the body's self defense mechanisms. Passing out puts the brain in a calmed state and causes it to shut down for a while, in order to begin the healing process.    Summary: 1. Refer to this scale when providing us with your pain level. 2. Be  accurate and careful when reporting your pain level. This will help with your care. 3. Over-reporting your pain level will lead to loss of credibility. 4. Even a level of 1/10 means that there is pain and will be treated at our facility. 5. High, inaccurate reporting will be documented as "Symptom Exaggeration", leading to loss of credibility and suspicions of possible secondary gains such as obtaining more narcotics, or wanting to appear disabled, for fraudulent reasons. 6. Only pain levels of 5 or below will be seen at our facility. 7. Pain levels of 6 and above will be sent to the Emergency Department and the appointment cancelled. ____________________________________________________________________________________________    

## 2017-02-14 NOTE — Progress Notes (Signed)
Patient's Name: [name] [name]  MRN: [id number]  Referring Provider: Barbette Merino, NP  DOB: 1960-12-17  PCP: [facility]  DOS: 02/14/2017  Note by: Thad Ranger, NP  Service setting: Ambulatory outpatient  Specialty: Interventional Pain Management  Patient type: Established  Location: [facility] (AMB) Pain Management Facility  Visit type: Interventional Procedure   Primary Reason for Visit: Interventional Pain Management Treatment. CC: Wrist Pain (left); Hand Pain (left); Knee Pain (left); and Foot Pain (left)  Procedure:  Anesthesia, Analgesia, Anxiolysis:  Type: Palliative Intravenous lidocaine infusion Region: Systemic Level: Upper Extremity IV access Laterality: Please see nurses note.  Type: Moderate (Conscious) Sedation Route: Intravenous (IV) IV Access: Secured Sedation: Meaningful verbal contact was maintained at all times during the procedure  Indication(s): Analgesia, anxiolysis, and seizure premedication  Indications: 1. Neurogenic pain   2. Diabetic peripheral neuropathy (HCC)   3. Rheumatoid arthritis involving multiple sites with positive rheumatoid factor (HCC)   4. Chronic pain syndrome    Pain Score: Pre-procedure: 5 /10 Post-procedure: 5 /10  Pre-op Assessment:  [name] [name] is a 56 y.o. (year old), female patient, seen today for interventional treatment. She  has a past surgical history that includes Tubal ligation; Tonsillectomy; Cholecystectomy; Abdominal hysterectomy; Laparoscopic gastric banding (03/20/07); Eye surgery; and Hernia repair. [name] [name] has a current medication list which includes the following prescription(s): b-d insulin syringe 1cc/25g, buspirone, clotrimazole-betamethasone, famotidine, fluoxetine, folic acid, hydroxychloroquine, hydroxyzine, insulin aspart, klor-con m20, levothyroxine, lisinopril, loperamide, lovastatin, metformin, methotrexate, oxycodone, oxycodone, oxycodone, oxycodone, prednisone, pregabalin, quetiapine, zolpidem, and  promethazine, and the following Facility-Administered Medications: lactated ringers and midazolam. Her primarily concern today is the Wrist Pain (left); Hand Pain (left); Knee Pain (left); and Foot Pain (left)  Initial Vital Signs: Blood pressure 127/84, pulse 88, temperature 98.8 F (37.1 C), resp. rate 16, height 5\' 6"  (1.676 m), weight (!) 343 lb (155.6 kg), last menstrual period 04/20/2001, SpO2 96 %. BMI: Estimated body mass index is 55.36 kg/m as calculated from the following:   Height as of this encounter: 5\' 6"  (1.676 m).   Weight as of this encounter: 343 lb (155.6 kg).  Risk Assessment: Allergies: Reviewed. She is allergic to codeine; propoxyphene; sulfa antibiotics; and hydrocodone.  Allergy Precautions: None required Coagulopathies: Reviewed. None identified.  Blood-thinner therapy: None at this time Active Infection(s): Reviewed. None identified. [name] [name] is afebrile  Site Confirmation: [name] [name] was asked to confirm the procedure and laterality before marking the site Procedure checklist: Completed Consent: Before the procedure and under the influence of no sedative(s), amnesic(s), or anxiolytics, the patient was informed of the treatment options, risks and possible complications. To fulfill our ethical and legal obligations, as recommended by the American Medical Association's Code of Ethics, I have informed the patient of my clinical impression; the nature and purpose of the treatment or procedure; the risks, benefits, and possible complications of the intervention; the alternatives, including doing nothing; the risk(s) and benefit(s) of the alternative treatment(s) or procedure(s); and the risk(s) and benefit(s) of doing nothing. The patient was provided information about the general risks and possible complications associated with any invasive procedure. These may include, but are not limited to: failure to achieve desired goals; pain; worsening of initial condition; infections;  bleeding; organ or nerve damage; allergic reactions; and death. In addition, the patient was informed of those risks and complications associated to this procedure, such as failure to decrease pain; infection; bleeding; phlebitis; vascular extravasation; tissue necrosis; tenderness at IV access site; skin or nerve  damage with subsequent damage to sensory, motor, and/or autonomic systems; worsening of the pain; persistent pain, numbness, and/or weakness of one or several areas of the body; cardiac dysrhythmias; seizure; stroke; and/or death. Furthermore, the patient was informed of those risks and complications associated with the medications used during the procedure. These include, but are not limited to: allergic reactions (i.e.: anaphylactic or anaphylactoid reactions); cardiac conduction blockade; poisoning; toxicity; CNS depression; cardiovascular depression and collapse; muscle twitching; tonic-clonic seizures; convulsions; loss of consciousness; coma; respiratory depression; arrest; and/or death. Finally, the patient was informed that Medicine is not an exact science; therefore, there is also the possibility of unforeseen or unpredictable risks and/or possible complications that may result in a catastrophic outcome. The patient indicated having understood very clearly. We have given the patient no guarantees and we have made no promises. Enough time was given to the patient to ask questions, all of which were answered to the patient's satisfaction. [name] [name] has indicated that she wanted to continue with the procedure. Attestation: I, the ordering provider, attest that I have discussed with the patient the benefits, risks, side-effects, alternatives, likelihood of achieving goals, and potential problems during recovery for the procedure that I have provided informed consent. Date: 02/14/2017; Time: 9:40 AM  Pre-Procedure Preparation:  Monitoring: As per clinic protocol. Respiration, ETCO2, SpO2, BP,  heart rate and rhythm monitor placed and checked for adequate function Safety Precautions: Patient was assessed for positional comfort and pressure points before starting the procedure. Time-out: I initiated and conducted the "Time-out" before starting the procedure, as per protocol. The patient was asked to participate by confirming the accuracy of the "Time Out" information. Verification of the correct person, site, and procedure were performed and confirmed by me, the nursing staff, and the patient. "Time-out" conducted as per Joint Commission's Universal Protocol (UP.01.01.01). "Time-out" Date & Time: 02/14/2017; 0959 hrs.  Description of Procedure Process:   Position: Supine Target Area: Intravenous Approach: Intravenous angiocath approach. Area Prepped: Antecubital Prepping solution: Isopropyl Alcohol (70%) Safety Precautions: Medications properly checked for expiration dates. SDV (single dose vial) medications used.  Dose Calculation: Lidocaine preparation: 2 grams of IV lidocaine in 500 mL of D5W (4 mg/mL) (0.4% Lidocaine) Maximum Lidocaine Dose: 4 mg/kg x (!) 343 lb (155.6 kg) =  623 mg.  Calculated infusion rate: 623 mg divided by 4 mg/mL = 155 mL in 60 minutes (124mL/hr) Set rate: 54 mL/hr Duration of Infusion: 1 hour  Description of the Procedure: Protocol guidelines were followed. The patient was placed in position. Informed consent was obtained.  She was cautioned to be on the look out for prodromal symptoms of a possible impending seizure, such as: new onset tinnitus; perioral, circumoral, or tongue numbness; or a metallic taste in her mouth, lightheadedness; dizziness; visual or auditory disturbances; disorientation; profound drowsiness; or muscle twitching. An IV was started and the patient's dose calculated as above. The infusion was carried out with a nurse at her side 100% of the time, monitoring her vitals as per protocol. Pre-procedure sedation was started 5-10 minutes  before infusion and midazolam was kept at bedside during the entire procedure, along with CPR equipment, as per protocol.  Vitals:   02/14/17 1055 02/14/17 1100 02/14/17 1105 02/14/17 1107  BP: (!) 112/56 127/72 121/64 123/64  Pulse:      Resp: 14 14 14 14   Temp:      SpO2: 92% 93% 93% 96%  Weight:      Height:  Start Time:   hrs. End Time:   hrs. Materials: IV infusion pump Medication(s): IV Lidocaine.  Imaging Guidance:  Type of Imaging Technique: None used Indication(s): N/A Exposure Time: No patient exposure Contrast: None used. Fluoroscopic Guidance: N/A Ultrasound Guidance: N/A Interpretation: N/A  Antibiotic Prophylaxis:  Indication(s): None identified Antibiotic given: None  Post-operative Assessment:  EBL: None Complications: No immediate post-treatment complications observed by team, or reported by patient. Note: The patient tolerated the entire procedure well. A repeat set of vitals were taken after the procedure and the patient was kept under observation following institutional policy, for this type of procedure. Post-procedural neurological assessment was performed, showing return to baseline, prior to discharge. The patient was provided with post-procedure discharge instructions, including a section on how to identify potential problems. Should any problems arise concerning this procedure, the patient was given instructions to immediately contact us, at any time, without hesitation. In any case, we plan to contact the patient by telephone for a follow-up status report regarding this interventional procedure. Comments:  No additional relevant information.  Plan of Care    Procedure Orders     LIDOCAINE INFUSION      Medications ordered for procedure: Meds ordered this encounter  Medications  . midazolam (VERSED) 5 MG/5ML injection 1-2 mg    Make sure Flumazenil is available in the pyxis when using this medication. If oversedation occurs, administer 0.2  mg IV over 15 sec. If after 45 sec no response, administer 0.2 mg again over 1 min; may repeat at 1 min intervals; not to exceed 4 doses (1 mg)  . lidocaine (cardiac) IV  infusion 4 mg/mL    IV Lidocaine 2 grams in 500 mL of D5W. (4 mg/mL) (0.4% Lidocaine)   Medications administered: We administered midazolam and lidocaine.  See the medical record for exact dosing rate.  New Prescriptions   No medications on file   Disposition: Discharge home  Discharge Date & Time: 02/14/2017; 1117 hrs.   Physician-requested Follow-up: Return in about 4 weeks (around 03/14/2017). Future Appointments Date Time Provider Department Center  03/16/2017 9:15 AM Barbette Merino, NP [facility] None  04/21/2017 1:40 PM Clapacs, Jackquline Denmark, MD [facility] None  05/12/2017 11:00 AM Barbette Merino, NP [facility] None   Primary Care Physician: [facility] Location: [facility] Facility Note by: Thad Ranger, NP Date: 02/14/2017; Time: 2:02 PM  Disclaimer:  Medicine is not an Visual merchandiser. The only guarantee in medicine is that nothing is guaranteed. It is important to note that the decision to proceed with this intervention was based on the information collected from the patient. The Data and conclusions were drawn from the patient's questionnaire, the interview, and the physical examination. Because the information was provided in large part by the patient, it cannot be guaranteed that it has not been purposely or unconsciously manipulated. Every effort has been made to obtain as much relevant data as possible for this evaluation. It is important to note that the conclusions that lead to this procedure are derived in large part from the available data. Always take into account that the treatment will also be dependent on availability of resources and existing treatment guidelines, considered by other Pain Management Practitioners as being common knowledge and practice, at the time of the  intervention. For Medico-Legal purposes, it is also important to point out that variation in procedural techniques and pharmacological choices are the acceptable norm. The indications, contraindications, technique, and results of the above procedure  should only be interpreted and judged by a Board-Certified Interventional Pain Specialist with extensive familiarity and expertise in the same exact procedure and technique.

## 2017-02-14 NOTE — Progress Notes (Signed)
Safety precautions to be maintained throughout the outpatient stay will include: orient to surroundings, keep bed in low position, maintain call bell within reach at all times, provide assistance with transfer out of bed and ambulation.  

## 2017-02-15 ENCOUNTER — Telehealth: Payer: Self-pay | Admitting: *Deleted

## 2017-02-15 NOTE — Telephone Encounter (Signed)
No problems post procedure. 

## 2017-02-16 LAB — TOXASSURE SELECT 13 (MW), URINE

## 2017-02-17 ENCOUNTER — Ambulatory Visit
Admission: RE | Admit: 2017-02-17 | Discharge: 2017-02-17 | Disposition: A | Discharge status: Home / Self Care | Payer: MEDICAID

## 2017-02-17 DIAGNOSIS — M069 Rheumatoid arthritis, unspecified: Principal | ICD-10-CM

## 2017-02-21 ENCOUNTER — Ambulatory Visit
Admission: RE | Admit: 2017-02-21 | Discharge: 2017-02-21 | Disposition: A | Discharge status: Home / Self Care | Payer: MEDICAID

## 2017-02-21 ENCOUNTER — Ambulatory Visit
Admission: RE | Admit: 2017-02-21 | Discharge: 2017-02-21 | Disposition: A | Discharge status: Home / Self Care | Payer: MEDICAID | Attending: Rheumatology | Admitting: Rheumatology

## 2017-02-21 DIAGNOSIS — M069 Rheumatoid arthritis, unspecified: Principal | ICD-10-CM

## 2017-02-21 MED ORDER — PREDNISONE 20 MG TABLET
ORAL_TABLET | Freq: Every day | ORAL | 0 refills | 0.00000 days | Status: CP
Start: 2017-02-21 — End: 2017-04-06

## 2017-02-21 MED ORDER — LEFLUNOMIDE 10 MG TABLET
ORAL_TABLET | Freq: Every day | ORAL | 2 refills | 0.00000 days | Status: CP
Start: 2017-02-21 — End: 2017-06-06

## 2017-03-01 ENCOUNTER — Ambulatory Visit: Admission: RE | Admit: 2017-03-01 | Discharge: 2017-03-01 | Payer: MEDICAID | Attending: Urology | Admitting: Urology

## 2017-03-01 DIAGNOSIS — N3281 Overactive bladder: Principal | ICD-10-CM

## 2017-03-01 DIAGNOSIS — N3020 Other chronic cystitis without hematuria: Secondary | ICD-10-CM

## 2017-03-01 DIAGNOSIS — R339 Retention of urine, unspecified: Secondary | ICD-10-CM

## 2017-03-01 DIAGNOSIS — N3941 Urge incontinence: Secondary | ICD-10-CM

## 2017-03-01 DIAGNOSIS — R350 Frequency of micturition: Secondary | ICD-10-CM

## 2017-03-15 MED ORDER — OXYCODONE-ACETAMINOPHEN 5 MG-325 MG TABLET
ORAL_TABLET | 0 refills | 0 days | Status: CP
Start: 2017-03-15 — End: 2017-10-02

## 2017-03-15 MED ORDER — ALPRAZOLAM 1 MG TABLET
ORAL_TABLET | ORAL | 0 refills | 0 days | Status: CP
Start: 2017-03-15 — End: 2017-03-23

## 2017-03-16 ENCOUNTER — Encounter: Payer: Self-pay | Admitting: Nurse Practitioner

## 2017-03-16 ENCOUNTER — Ambulatory Visit: Payer: Medicaid Other | Attending: Nurse Practitioner | Admitting: Nurse Practitioner

## 2017-03-16 VITALS — BP 141/78 | HR 77 | Temp 98.2°F | Resp 12 | Ht 66.0 in | Wt 355.0 lb

## 2017-03-16 DIAGNOSIS — Z9049 Acquired absence of other specified parts of digestive tract: Secondary | ICD-10-CM | POA: Diagnosis not present

## 2017-03-16 DIAGNOSIS — E1142 Type 2 diabetes mellitus with diabetic polyneuropathy: Secondary | ICD-10-CM | POA: Diagnosis not present

## 2017-03-16 DIAGNOSIS — Z9851 Tubal ligation status: Secondary | ICD-10-CM | POA: Insufficient documentation

## 2017-03-16 DIAGNOSIS — M792 Neuralgia and neuritis, unspecified: Secondary | ICD-10-CM

## 2017-03-16 DIAGNOSIS — Z90710 Acquired absence of both cervix and uterus: Secondary | ICD-10-CM | POA: Diagnosis not present

## 2017-03-16 DIAGNOSIS — Z882 Allergy status to sulfonamides status: Secondary | ICD-10-CM | POA: Insufficient documentation

## 2017-03-16 DIAGNOSIS — Z885 Allergy status to narcotic agent status: Secondary | ICD-10-CM | POA: Diagnosis not present

## 2017-03-16 DIAGNOSIS — Z888 Allergy status to other drugs, medicaments and biological substances status: Secondary | ICD-10-CM | POA: Insufficient documentation

## 2017-03-16 DIAGNOSIS — Z79891 Long term (current) use of opiate analgesic: Secondary | ICD-10-CM

## 2017-03-16 DIAGNOSIS — Z9884 Bariatric surgery status: Secondary | ICD-10-CM | POA: Insufficient documentation

## 2017-03-16 DIAGNOSIS — Z9889 Other specified postprocedural states: Secondary | ICD-10-CM | POA: Insufficient documentation

## 2017-03-16 DIAGNOSIS — M79642 Pain in left hand: Secondary | ICD-10-CM | POA: Insufficient documentation

## 2017-03-16 DIAGNOSIS — M25532 Pain in left wrist: Secondary | ICD-10-CM | POA: Diagnosis present

## 2017-03-16 MED ORDER — LIDOCAINE IN D5W 4-5 MG/ML-% IV SOLN
4.0000 mg/min | INTRAVENOUS | Status: AC
  Administered 2017-03-16: 4 mg/min via INTRAVENOUS
  Filled 2017-03-16: qty 500

## 2017-03-16 MED ORDER — MIDAZOLAM HCL 5 MG/5ML IJ SOLN
1.0000 mg | INTRAMUSCULAR | Status: DC | PRN
  Administered 2017-03-16: 1 mg via INTRAVENOUS
  Filled 2017-03-16: qty 5

## 2017-03-16 NOTE — Progress Notes (Signed)
Safety precautions to be maintained throughout the outpatient stay will include: orient to surroundings, keep bed in low position, maintain call bell within reach at all times, provide assistance with transfer out of bed and ambulation.  

## 2017-03-16 NOTE — Progress Notes (Signed)
Patient's Name: [name] [name]  MRN: [id number]  Referring Provider: [facility]  DOB: February 12, 1961  PCP: [facility]  DOS: 03/16/2017  Note by: Thad Ranger, NP  Service setting: Ambulatory outpatient  Specialty: Interventional Pain Management  Patient type: Established  Location: [facility] (AMB) Pain Management Facility  Visit type: Interventional Procedure   Primary Reason for Visit: Interventional Pain Management Treatment. CC: Wrist Pain (left) and Hand Pain (left)  Procedure:  Anesthesia, Analgesia, Anxiolysis:  Type: Palliative Intravenous lidocaine infusion Region: Systemic Level: Upper Extremity IV access Laterality: Please see nurses note.  Type: Moderate (Conscious) Sedation Route: Intravenous (IV) IV Access: Secured Sedation: Meaningful verbal contact was maintained at all times during the procedure  Indication(s): Analgesia, anxiolysis, and seizure premedication  Indications: 1. Neuropathic pain   2. Neurogenic pain   3. Diabetic peripheral neuropathy (HCC)   4. Long term prescription opiate use    Pain Score: Pre-procedure: 2 /10 Post-procedure: 0-No pain/10  Pre-op Assessment:  [name] [name] is a 56 y.o. (year old), female patient, seen today for interventional treatment. She  has a past surgical history that includes Tubal ligation; Tonsillectomy; Cholecystectomy; Abdominal hysterectomy; Laparoscopic gastric banding (03/20/07); Eye surgery; and Hernia repair. [name] [name] has a current medication list which includes the following prescription(s): b-d insulin syringe 1cc/25g, buspirone, clotrimazole-betamethasone, famotidine, fluoxetine, folic acid, hydroxychloroquine, hydroxyzine, insulin aspart, klor-con m20, levothyroxine, lisinopril, loperamide, lovastatin, metformin, methotrexate, oxycodone, oxycodone, oxycodone, oxycodone, prednisone, pregabalin, promethazine, quetiapine, and zolpidem, and the following Facility-Administered Medications: lactated  ringers and midazolam. Her primarily concern today is the Wrist Pain (left) and Hand Pain (left)  Initial Vital Signs: Last menstrual period 04/20/2001. BMI: Estimated body mass index is 57.3 kg/m as calculated from the following:   Height as of this encounter: 5\' 6"  (1.676 m).   Weight as of this encounter: 355 lb (161 kg).  Risk Assessment: Allergies: Reviewed. She is allergic to codeine; propoxyphene; sulfa antibiotics; and hydrocodone.  Allergy Precautions: None required Coagulopathies: Reviewed. None identified.  Blood-thinner therapy: None at this time Active Infection(s): Reviewed. None identified. [name] [name] is afebrile  Site Confirmation: [name] [name] was asked to confirm the procedure and laterality before marking the site Procedure checklist: Completed Consent: Before the procedure and under the influence of no sedative(s), amnesic(s), or anxiolytics, the patient was informed of the treatment options, risks and possible complications. To fulfill our ethical and legal obligations, as recommended by the American Medical Association's Code of Ethics, I have informed the patient of my clinical impression; the nature and purpose of the treatment or procedure; the risks, benefits, and possible complications of the intervention; the alternatives, including doing nothing; the risk(s) and benefit(s) of the alternative treatment(s) or procedure(s); and the risk(s) and benefit(s) of doing nothing. The patient was provided information about the general risks and possible complications associated with any invasive procedure. These may include, but are not limited to: failure to achieve desired goals; pain; worsening of initial condition; infections; bleeding; organ or nerve damage; allergic reactions; and death. In addition, the patient was informed of those risks and complications associated to this procedure, such as failure to decrease pain; infection; bleeding; phlebitis; vascular extravasation;  tissue necrosis; tenderness at IV access site; skin or nerve damage with subsequent damage to sensory, motor, and/or autonomic systems; worsening of the pain; persistent pain, numbness, and/or weakness of one or several areas of the body; cardiac dysrhythmias; seizure; stroke; and/or death. Furthermore, the patient was informed of those risks and complications associated with the  medications used during the procedure. These include, but are not limited to: allergic reactions (i.e.: anaphylactic or anaphylactoid reactions); cardiac conduction blockade; poisoning; toxicity; CNS depression; cardiovascular depression and collapse; muscle twitching; tonic-clonic seizures; convulsions; loss of consciousness; coma; respiratory depression; arrest; and/or death. Finally, the patient was informed that Medicine is not an exact science; therefore, there is also the possibility of unforeseen or unpredictable risks and/or possible complications that may result in a catastrophic outcome. The patient indicated having understood very clearly. We have given the patient no guarantees and we have made no promises. Enough time was given to the patient to ask questions, all of which were answered to the patient's satisfaction. [name] [name] has indicated that she wanted to continue with the procedure. Attestation: I, the ordering provider, attest that I have discussed with the patient the benefits, risks, side-effects, alternatives, likelihood of achieving goals, and potential problems during recovery for the procedure that I have provided informed consent. Date: 03/16/2017; Time: 8:32 AM  Pre-Procedure Preparation:  Monitoring: As per clinic protocol. Respiration, ETCO2, SpO2, BP, heart rate and rhythm monitor placed and checked for adequate function Safety Precautions: Patient was assessed for positional comfort and pressure points before starting the procedure. Time-out: I initiated and conducted the "Time-out" before starting  the procedure, as per protocol. The patient was asked to participate by confirming the accuracy of the "Time Out" information. Verification of the correct person, site, and procedure were performed and confirmed by me, the nursing staff, and the patient. "Time-out" conducted as per Joint Commission's Universal Protocol (UP.01.01.01). "Time-out" Date & Time: 03/16/2017; 1007 hrs.  Description of Procedure Process:   Position: Supine Target Area: Intravenous Approach: Intravenous angiocath approach. Area Prepped: Antecubital Prepping solution: Isopropyl Alcohol (70%) Safety Precautions: Medications properly checked for expiration dates. SDV (single dose vial) medications used.  Dose Calculation: Lidocaine preparation: 2 grams of IV lidocaine in 500 mL of D5W (4 mg/mL) (0.4% Lidocaine) Maximum Lidocaine Dose: 4 mg/kg x (!) 343 lb (155.6 kg) =  623 mg.  Calculated infusion rate: 623 mg divided by 4 mg/mL = 155 mL in 60 minutes (180mL/hr) Set rate: 54 mL/hr Duration of Infusion: 1 hour  Description of the Procedure: Protocol guidelines were followed. The patient was placed in position. Informed consent was obtained.  She was cautioned to be on the look out for prodromal symptoms of a possible impending seizure, such as: new onset tinnitus; perioral, circumoral, or tongue numbness; or a metallic taste in her mouth, lightheadedness; dizziness; visual or auditory disturbances; disorientation; profound drowsiness; or muscle twitching. An IV was started and the patient's dose calculated as above. The infusion was carried out with a nurse at her side 100% of the time, monitoring her vitals as per protocol. Pre-procedure sedation was started 5-10 minutes before infusion and midazolam was kept at bedside during the entire procedure, along with CPR equipment, as per protocol.  Vitals:   03/16/17 1132 03/16/17 1137 03/16/17 1142 03/16/17 1152  BP: 121/68 120/66 121/64 (!) 141/78  Pulse: 74 74 74 77  Resp:  12 12 12 12   Temp:      TempSrc:      SpO2: 94% 93% 93% 94%  Weight:      Height:        Start Time: 1044 hrs. End Time: 1144 hrs. Materials: IV infusion pump Medication(s): IV Lidocaine.  Imaging Guidance:  Type of Imaging Technique: None used Indication(s): N/A Exposure Time: No patient exposure Contrast: None used. Fluoroscopic Guidance: N/A Ultrasound Guidance:  N/A Interpretation: N/A  Antibiotic Prophylaxis:  Indication(s): None identified Antibiotic given: None  Post-operative Assessment:  EBL: None Complications: No immediate post-treatment complications observed by team, or reported by patient. Note: The patient tolerated the entire procedure well. A repeat set of vitals were taken after the procedure and the patient was kept under observation following institutional policy, for this type of procedure. Post-procedural neurological assessment was performed, showing return to baseline, prior to discharge. The patient was provided with post-procedure discharge instructions, including a section on how to identify potential problems. Should any problems arise concerning this procedure, the patient was given instructions to immediately contact us, at any time, without hesitation. In any case, we plan to contact the patient by telephone for a follow-up status report regarding this interventional procedure. Comments:  No additional relevant information.  Plan of Care   Procedure Orders    No procedure(s) ordered today    Medications ordered for procedure: Meds ordered this encounter  Medications  . lidocaine (cardiac) IV  infusion 4 mg/mL    IV Lidocaine 2 grams in 500 mL of D5W. (4 mg/mL) (0.4% Lidocaine)  . midazolam (VERSED) 5 MG/5ML injection 1-2 mg    Make sure Flumazenil is available in the pyxis when using this medication. If oversedation occurs, administer 0.2 mg IV over 15 sec. If after 45 sec no response, administer 0.2 mg again over 1 min; may repeat at 1 min  intervals; not to exceed 4 doses (1 mg)   Medications administered: We administered lidocaine and midazolam.  See the medical record for exact dosing rate.  This SmartLink is deprecated. Use AVSMEDLIST instead to display the medication list for a patient. Disposition: Discharge home  Discharge Date & Time: 03/16/2017; 1200 hrs.   Physician-requested Follow-up: Return in about 2 weeks (around 03/30/2017) for F/U eval. Future Appointments  Date Time Provider Department Center  03/30/2017 10:30 AM Barbette Merino, NP [facility] None  04/21/2017  1:40 PM Clapacs, Jackquline Denmark, MD [facility] None  05/12/2017 11:00 AM Barbette Merino, NP [facility] None   Primary Care Physician: [facility] Location: [facility] Outpatient Pain Management Facility Note by: Thad Ranger, NP Date: 03/16/2017; Time: 12:11 PM  Disclaimer:  Medicine is not an Visual merchandiser. The only guarantee in medicine is that nothing is guaranteed. It is important to note that the decision to proceed with this intervention was based on the information collected from the patient. The Data and conclusions were drawn from the patient's questionnaire, the interview, and the physical examination. Because the information was provided in large part by the patient, it cannot be guaranteed that it has not been purposely or unconsciously manipulated. Every effort has been made to obtain as much relevant data as possible for this evaluation. It is important to note that the conclusions that lead to this procedure are derived in large part from the available data. Always take into account that the treatment will also be dependent on availability of resources and existing treatment guidelines, considered by other Pain Management Practitioners as being common knowledge and practice, at the time of the intervention. For Medico-Legal purposes, it is also important to point out that variation in procedural techniques and pharmacological choices are  the acceptable norm. The indications, contraindications, technique, and results of the above procedure should only be interpreted and judged by a Board-Certified Interventional Pain Specialist with extensive familiarity and expertise in the same exact procedure and technique.

## 2017-03-17 ENCOUNTER — Telehealth: Payer: Self-pay | Admitting: *Deleted

## 2017-03-17 ENCOUNTER — Ambulatory Visit
Admission: RE | Admit: 2017-03-17 | Discharge: 2017-03-17 | Disposition: A | Discharge status: Home / Self Care | Payer: MEDICAID

## 2017-03-17 DIAGNOSIS — M069 Rheumatoid arthritis, unspecified: Principal | ICD-10-CM

## 2017-03-17 NOTE — Telephone Encounter (Signed)
LVM for patient to call for any post procedure problems.

## 2017-03-23 ENCOUNTER — Ambulatory Visit: Admission: RE | Admit: 2017-03-23 | Discharge: 2017-03-23 | Payer: MEDICAID | Attending: Urology | Admitting: Urology

## 2017-03-23 ENCOUNTER — Ambulatory Visit: Admission: RE | Admit: 2017-03-23 | Discharge: 2017-03-23 | Payer: MEDICAID | Attending: Family | Admitting: Family

## 2017-03-23 DIAGNOSIS — Z6841 Body Mass Index (BMI) 40.0 and over, adult: Secondary | ICD-10-CM

## 2017-03-23 DIAGNOSIS — F319 Bipolar disorder, unspecified: Secondary | ICD-10-CM

## 2017-03-23 DIAGNOSIS — F418 Other specified anxiety disorders: Secondary | ICD-10-CM

## 2017-03-23 DIAGNOSIS — J449 Chronic obstructive pulmonary disease, unspecified: Secondary | ICD-10-CM

## 2017-03-23 DIAGNOSIS — M069 Rheumatoid arthritis, unspecified: Secondary | ICD-10-CM

## 2017-03-23 DIAGNOSIS — E1165 Type 2 diabetes mellitus with hyperglycemia: Principal | ICD-10-CM

## 2017-03-23 DIAGNOSIS — Z23 Encounter for immunization: Secondary | ICD-10-CM

## 2017-03-30 ENCOUNTER — Ambulatory Visit: Payer: Medicaid Other | Admitting: Nurse Practitioner

## 2017-04-06 MED ORDER — PREDNISONE 20 MG TABLET
ORAL_TABLET | Freq: Every day | ORAL | 0 refills | 0 days | Status: SS
Start: 2017-04-06 — End: 2017-11-16

## 2017-04-20 ENCOUNTER — Ambulatory Visit: Admit: 2017-04-20 | Discharge: 2017-04-20 | Payer: MEDICAID

## 2017-04-20 ENCOUNTER — Ambulatory Visit: Admit: 2017-04-20 | Discharge: 2017-04-20 | Payer: MEDICAID | Attending: Urology | Primary: Urology

## 2017-04-20 DIAGNOSIS — M069 Rheumatoid arthritis, unspecified: Principal | ICD-10-CM

## 2017-04-21 ENCOUNTER — Ambulatory Visit: Payer: Medicaid Other | Admitting: Psychiatry

## 2017-04-28 ENCOUNTER — Encounter: Payer: Self-pay | Admitting: *Deleted

## 2017-04-28 ENCOUNTER — Other Ambulatory Visit: Payer: Self-pay

## 2017-04-28 ENCOUNTER — Ambulatory Visit
Admission: EM | Admit: 2017-04-28 | Discharge: 2017-04-28 | Disposition: A | Discharge status: Home / Self Care | Payer: Medicaid Other | Attending: Family Medicine | Admitting: Family Medicine

## 2017-04-28 DIAGNOSIS — Z794 Long term (current) use of insulin: Secondary | ICD-10-CM | POA: Insufficient documentation

## 2017-04-28 DIAGNOSIS — K449 Diaphragmatic hernia without obstruction or gangrene: Secondary | ICD-10-CM | POA: Diagnosis not present

## 2017-04-28 DIAGNOSIS — G894 Chronic pain syndrome: Secondary | ICD-10-CM | POA: Diagnosis not present

## 2017-04-28 DIAGNOSIS — G4730 Sleep apnea, unspecified: Secondary | ICD-10-CM | POA: Diagnosis not present

## 2017-04-28 DIAGNOSIS — F331 Major depressive disorder, recurrent, moderate: Secondary | ICD-10-CM | POA: Diagnosis not present

## 2017-04-28 DIAGNOSIS — Z9889 Other specified postprocedural states: Secondary | ICD-10-CM | POA: Insufficient documentation

## 2017-04-28 DIAGNOSIS — A0472 Enterocolitis due to Clostridium difficile, not specified as recurrent: Secondary | ICD-10-CM | POA: Insufficient documentation

## 2017-04-28 DIAGNOSIS — Z882 Allergy status to sulfonamides status: Secondary | ICD-10-CM | POA: Insufficient documentation

## 2017-04-28 DIAGNOSIS — E7801 Familial hypercholesterolemia: Secondary | ICD-10-CM | POA: Insufficient documentation

## 2017-04-28 DIAGNOSIS — E039 Hypothyroidism, unspecified: Secondary | ICD-10-CM | POA: Insufficient documentation

## 2017-04-28 DIAGNOSIS — F419 Anxiety disorder, unspecified: Secondary | ICD-10-CM | POA: Insufficient documentation

## 2017-04-28 DIAGNOSIS — R05 Cough: Secondary | ICD-10-CM | POA: Diagnosis not present

## 2017-04-28 DIAGNOSIS — M25532 Pain in left wrist: Secondary | ICD-10-CM | POA: Insufficient documentation

## 2017-04-28 DIAGNOSIS — E876 Hypokalemia: Secondary | ICD-10-CM | POA: Insufficient documentation

## 2017-04-28 DIAGNOSIS — E1142 Type 2 diabetes mellitus with diabetic polyneuropathy: Secondary | ICD-10-CM | POA: Insufficient documentation

## 2017-04-28 DIAGNOSIS — H6692 Otitis media, unspecified, left ear: Secondary | ICD-10-CM | POA: Insufficient documentation

## 2017-04-28 DIAGNOSIS — K219 Gastro-esophageal reflux disease without esophagitis: Secondary | ICD-10-CM | POA: Diagnosis not present

## 2017-04-28 DIAGNOSIS — Z79891 Long term (current) use of opiate analgesic: Secondary | ICD-10-CM | POA: Insufficient documentation

## 2017-04-28 DIAGNOSIS — E1165 Type 2 diabetes mellitus with hyperglycemia: Secondary | ICD-10-CM | POA: Diagnosis not present

## 2017-04-28 DIAGNOSIS — Z79899 Other long term (current) drug therapy: Secondary | ICD-10-CM | POA: Insufficient documentation

## 2017-04-28 DIAGNOSIS — Z8249 Family history of ischemic heart disease and other diseases of the circulatory system: Secondary | ICD-10-CM | POA: Insufficient documentation

## 2017-04-28 DIAGNOSIS — J449 Chronic obstructive pulmonary disease, unspecified: Secondary | ICD-10-CM | POA: Insufficient documentation

## 2017-04-28 DIAGNOSIS — I5032 Chronic diastolic (congestive) heart failure: Secondary | ICD-10-CM | POA: Insufficient documentation

## 2017-04-28 DIAGNOSIS — R0981 Nasal congestion: Secondary | ICD-10-CM | POA: Insufficient documentation

## 2017-04-28 DIAGNOSIS — Z950 Presence of cardiac pacemaker: Secondary | ICD-10-CM | POA: Insufficient documentation

## 2017-04-28 DIAGNOSIS — Z6841 Body Mass Index (BMI) 40.0 and over, adult: Secondary | ICD-10-CM | POA: Insufficient documentation

## 2017-04-28 DIAGNOSIS — M25511 Pain in right shoulder: Secondary | ICD-10-CM | POA: Diagnosis not present

## 2017-04-28 DIAGNOSIS — Z9049 Acquired absence of other specified parts of digestive tract: Secondary | ICD-10-CM | POA: Insufficient documentation

## 2017-04-28 DIAGNOSIS — F603 Borderline personality disorder: Secondary | ICD-10-CM | POA: Insufficient documentation

## 2017-04-28 DIAGNOSIS — J020 Streptococcal pharyngitis: Secondary | ICD-10-CM | POA: Insufficient documentation

## 2017-04-28 DIAGNOSIS — N393 Stress incontinence (female) (male): Secondary | ICD-10-CM | POA: Insufficient documentation

## 2017-04-28 DIAGNOSIS — M069 Rheumatoid arthritis, unspecified: Secondary | ICD-10-CM | POA: Insufficient documentation

## 2017-04-28 DIAGNOSIS — Z90710 Acquired absence of both cervix and uterus: Secondary | ICD-10-CM | POA: Insufficient documentation

## 2017-04-28 DIAGNOSIS — Z87891 Personal history of nicotine dependence: Secondary | ICD-10-CM | POA: Insufficient documentation

## 2017-04-28 DIAGNOSIS — Z888 Allergy status to other drugs, medicaments and biological substances status: Secondary | ICD-10-CM | POA: Insufficient documentation

## 2017-04-28 DIAGNOSIS — K589 Irritable bowel syndrome without diarrhea: Secondary | ICD-10-CM | POA: Insufficient documentation

## 2017-04-28 DIAGNOSIS — Z885 Allergy status to narcotic agent status: Secondary | ICD-10-CM | POA: Insufficient documentation

## 2017-04-28 DIAGNOSIS — Z818 Family history of other mental and behavioral disorders: Secondary | ICD-10-CM | POA: Insufficient documentation

## 2017-04-28 LAB — RAPID STREP SCREEN (MED CTR MEBANE ONLY): Streptococcus, Group A Screen (Direct): POSITIVE — AB

## 2017-04-28 MED ORDER — CEFDINIR 300 MG PO CAPS: 300.0000 mg | ORAL_CAPSULE | Freq: Two times a day (BID) | ORAL | 0 refills | Status: DC

## 2017-04-28 NOTE — Discharge Instructions (Signed)
You have strep throat and an ear infection.  Antibiotic as prescribed.  Take care  Dr. Adriana Simas

## 2017-04-28 NOTE — ED Triage Notes (Signed)
PAtient started having symptoms of sore throat, nasal congestion, and productive cough 3 days ago.

## 2017-04-28 NOTE — ED Provider Notes (Signed)
[facility]    CSN: [id number] Arrival date & time: 04/28/17  1015  History   Chief Complaint Chief Complaint  Patient presents with  . Sore Throat  . Nasal Congestion   HPI  57 year female presents with sore throat, congestion, ear pain, cough.  Patient reports that she has bee sick for the past 3-4 days.  Patient reports severe sore throat.  Currently 10/10 in severity.  She has a history of chronic pain and RA.  She is on methotrexate and infusion therapy.  She also reports associated cough which is productive as well as congestion and ear pain (left).  No fever.  No medications tried.  No reported sick contacts.  No other combines or concerns at this time.  Past Medical History:  Diagnosis Date  . Abdominal wall hernia 01/29/2013  . Anxiety   . Arthritis    Rheumatoid  . C. difficile colitis   . Chronic diastolic heart failure (HCC)   . Depression   . Diabetes mellitus    states no meds or diet restrictions  at present  . Diastolic CHF (HCC)   . Esophagitis   . Fluid retention   . GERD (gastroesophageal reflux disease)   . Hiatal hernia   . Hypertension   . Hypokalemia due to loss of potassium 10/21/2015   Overview:  Associated with 3 weeks of diarrhea  And QT prolongation.  . Hypothyroidism   . IBS (irritable bowel syndrome)   . Moderate episode of recurrent major depressive disorder (HCC) 06/03/2004  . Morbid obesity (HCC)   . Neurogenic bladder    has pacemaker  . Neuropathy   . Obesity   . Panic attacks   . Rheumatoid arthritis (HCC)   . Sleep apnea    STATES SEVERE, CANT TOLERATE MASK- LAST STUDY YEARS AGO   Patient Active Problem List   Diagnosis Date Noted  . Chronic pain syndrome 03/31/2016  . Insomnia secondary to chronic pain 03/31/2016  . Chronic upper back pain 12/25/2015  . Chronic hand pain (Bilateral) (L>R) 12/25/2015  . Rheumatoid arthritis (HCC) 12/25/2015  . Morbid obesity with BMI of 40.0-44.9, adult (HCC) 12/24/2015  .  Osteoarthritis, multiple sites 12/24/2015  . Chronic foot pain (Location of Primary Source of Pain) (Bilateral) (L>R) 12/24/2015  . Chronic elbow pain (Location of Tertiary source of pain) (Bilateral) (L>R) 12/24/2015  . Chronic shoulder pain (Bilateral) (L>R) 12/24/2015  . Chronic neck pain (Bilateral) (R>L) 12/24/2015  . 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[employer]) 12/23/2015  . Chronic knee pain (Bilateral) (R>L) 12/23/2015  . Long term current use of opiate analgesic 12/23/2015  . Long term prescription opiate use 12/23/2015  . Opiate use (30 MME/Day) 12/23/2015  . Neurogenic pain 12/23/2015  . Neuropathic pain 12/23/2015  . Diabetic peripheral neuropathy (HCC) 12/23/2015  . Encounter for therapeutic drug level monitoring 12/23/2015  . Encounter for pain management planning 12/23/2015  . GERD (gastroesophageal reflux disease) 11/25/2015  . Neuropathy 11/25/2015  . Hypokalemia 10/21/2015  . Hypomagnesemia 10/21/2015  . QT prolongation 10/21/2015  . Type 2 diabetes mellitus with hyperglycemia (HCC) 10/21/2015  . Chronic wrist pain (Location of Secondary source of pain) (Bilateral) (L>R) 03/19/2015  . 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 Female genuine stress incontinence 02/14/2015  . Urge incontinence of urine 02/14/2015  . Obstructive apnea 07/03/2014  . Chronic diastolic CHF (congestive heart failure), NYHA class 3 (HCC) 07/03/2014  . Anxiety 01/03/2014  . Diabetes mellitus (HCC) 01/03/2014  . Chronic obstructive pulmonary disease (HCC)  01/03/2014  . Bipolar affective disorder (HCC) 01/03/2014  . Diastolic dysfunction 01/03/2014  . Combined fat and carbohydrate induced hyperlipemia 01/03/2014  . Shortness of breath 01/03/2014  . Incomplete bladder emptying 11/02/2012  . Bladder retention 10/10/2012  . Detrusor muscle hypertonia 10/04/2012  . Obstruction of urinary tract 10/04/2012  . FOM (frequency of micturition) 10/04/2012  . Vitamin D deficiency  04/15/2012  . Borderline personality disorder (HCC) 01/06/2012  . Mixed anxiety depressive disorder 01/06/2012  . History of laparoscopic adjustable gastric banding, 03/20/2007.  Removed 09/19/2011. 08/04/2011  . Hypothyroidism 06/28/2010  . Essential (primary) hypertension 01/27/2005  . Major depressive disorder, recurrent episode, moderate (HCC) 06/03/2004   Past Surgical History:  Procedure Laterality Date  . ABDOMINAL HYSTERECTOMY    . CHOLECYSTECTOMY    . EYE SURGERY     bilateral cataract extraction with IOL  . HERNIA REPAIR     ventral hernia with strangulation  . LAPAROSCOPIC GASTRIC BANDING  03/20/07  . TONSILLECTOMY    . TUBAL LIGATION     OB History    No data available     Home Medications    Prior to Admission medications   Medication Sig Start Date End Date Taking? Authorizing Provider  busPIRone (BUSPAR) 10 MG tablet Take 1 tablet (10 mg total) by mouth 2 (two) times daily. 01/20/17  Yes Clapacs, Jackquline Denmark, MD  clotrimazole-betamethasone (LOTRISONE) cream Apply 1 application topically 2 (two) times daily. For yeast infection under stomach 05/07/11  Yes [provider]  famotidine (PEPCID) 20 MG tablet Take 20 mg by mouth daily.    Yes [provider]  FLUoxetine (PROZAC) 20 MG capsule Take 1 capsule (20 mg total) by mouth daily. 01/20/17  Yes Clapacs, Jackquline Denmark, MD  folic acid (FOLVITE) 1 MG tablet Take 1 mg by mouth daily.    Yes [provider]  hydroxychloroquine (PLAQUENIL) 200 MG tablet Take 200 mg by mouth 2 (two) times daily.   Yes [provider]  hydrOXYzine (ATARAX/VISTARIL) 50 MG tablet Take 50 mg by mouth Twice daily.  07/07/11  Yes [provider]  Insulin Aspart (NOVOLOG [place]) Inject 10 Units into the skin 2 (two) times daily. Injection twice daily   Yes [provider]  KLOR-CON M20 20 MEQ tablet TAKE ONE TABLET BY MOUTH TWICE DAILY Patient taking differently: take 2 tabs in the a.m. , 1 tab in the evening  04/10/13  Yes Iran Ouch, MD  levothyroxine (SYNTHROID, LEVOTHROID) 88 MCG tablet Take 88 mcg by mouth daily before breakfast.   Yes [provider]  lisinopril (PRINIVIL,ZESTRIL) 2.5 MG tablet Take 2.5 mg by mouth daily.   Yes [provider]  lovastatin (MEVACOR) 40 MG tablet Take 40 mg by mouth daily.    Yes [provider]  metFORMIN (GLUCOPHAGE) 500 MG tablet Take 500 mg by mouth 2 (two) times daily with a meal.    Yes [provider]  methotrexate 50 MG/2ML injection Inject 1 mL into the muscle once a week.   Yes [provider]  predniSONE (DELTASONE) 10 MG tablet Take 40 mg by mouth daily as needed (Pain scale  Used to determine dosage). Reported on 08/29/2015   Yes [provider]  pregabalin (LYRICA) 150 MG capsule Take 1 capsule (150 mg total) by mouth 3 (three) times daily. 02/09/17 05/10/17 Yes Barbette Merino, NP  promethazine (PHENERGAN) 25 MG tablet Take 1 tablet (25 mg total) by mouth every 6 (six) hours as needed for nausea  or vomiting. 11/10/16 04/28/17 Yes King, Shana Chute, NP  QUEtiapine (SEROQUEL) 300 MG tablet Take 1 tablet (300 mg total) by mouth at bedtime. 01/20/17  Yes Clapacs, Jackquline Denmark, MD  zolpidem (AMBIEN) 5 MG tablet Take 1 tablet (5 mg total) by mouth at bedtime. 12/09/16  Yes Clapacs, Jackquline Denmark, MD  B-D INSULIN SYRINGE 1CC/25G 25G X 5/8" 1 ML MISC 1 UNITS BY MISCELLANEOUS ROUTE EVERY SEVEN (7) DAYS. TO BE USED WITH METHOTREXATE 12/10/16   [provider]  cefdinir (OMNICEF) 300 MG capsule Take 1 capsule (300 mg total) by mouth 2 (two) times daily. 04/28/17   Tommie Sams, DO  loperamide (IMODIUM) 2 MG capsule Take 2 mg by mouth as needed.  06/21/16   [provider]  oxyCODONE (OXY IR/ROXICODONE) 5 MG immediate release tablet TAKE 1 TABLET BY MOUTH EVERY 6 HOURS AS NEEDED SEVERE PAIN 12/29/16   [provider]  oxyCODONE (OXY IR/ROXICODONE) 5 MG immediate release tablet Take 1 tablet (5 mg total)  by mouth every 6 (six) hours as needed for severe pain. 02/28/17 03/30/17  Barbette Merino, NP  oxyCODONE (OXY IR/ROXICODONE) 5 MG immediate release tablet Take 1 tablet (5 mg total) by mouth every 6 (six) hours as needed for severe pain. 04/29/17 05/29/17  Barbette Merino, NP   Family History Family History  Problem Relation Age of Onset  . Heart failure Father   . Bipolar disorder Father   . Alcohol abuse Father   . Anxiety disorder Father   . Depression Father   . Heart disease Brother   . Heart attack Brother 27       MI s/p stents placed  . Anxiety disorder Sister   . Depression Sister   . Anxiety disorder Sister   . Depression Sister   . Bipolar disorder Sister   . Alcohol abuse Sister   . Drug abuse Sister   . Heart attack Brother    Social History Social History   Tobacco Use  . Smoking status: Former Smoker    Packs/day: 2.00    Years: 27.00    Pack years: 54.00    Types: Cigarettes    Last attempt to quit: 07/30/1999    Years since quitting: 17.7  . Smokeless tobacco: Never Used  Substance Use Topics  . Alcohol use: No  . Drug use: No   Allergies   Codeine; Propoxyphene; Sulfa antibiotics; and Hydrocodone   Review of Systems Review of Systems  Constitutional: Negative for fever.  HENT: Positive for congestion and sore throat.   Respiratory: Positive for cough.    Physical Exam Triage Vital Signs ED Triage Vitals  Enc Vitals Group     BP 04/28/17 1036 (!) 172/100     Pulse Rate 04/28/17 1036 87     Resp 04/28/17 1036 20     Temp 04/28/17 1036 99.1 F (37.3 C)     Temp Source 04/28/17 1036 Oral     SpO2 04/28/17 1036 100 %     Weight 04/28/17 1036 (!) 340 lb (154.2 kg)     Height 04/28/17 1036 5\' 6"  (1.676 m)     Head Circumference --      Peak Flow --      Pain Score 04/28/17 1037 10     Pain Loc --      Pain Edu? --      Excl. in GC? --    Updated Vital Signs BP (!) 172/100 (BP Location: Left Arm)  Pulse 87   Temp 99.1 F (37.3 C)  (Oral)   Resp 20   Ht 5\' 6"  (1.676 m)   Wt (!) 340 lb (154.2 kg)   LMP 04/20/2001   SpO2 100%   BMI 54.88 kg/m    Physical Exam  Constitutional: She is oriented to person, place, and time. She appears well-developed. No distress.  HENT:  Head: Normocephalic and atraumatic.  Severe oropharyngeal erythema. Left TM with severe erythema.  Eyes: Conjunctivae are normal. Right eye exhibits no discharge. Left eye exhibits no discharge.  Neck: Neck supple.  Cardiovascular: Normal rate and regular rhythm.  Pulmonary/Chest: Effort normal and breath sounds normal. She has no wheezes. She has no rales.  Lymphadenopathy:    She has no cervical adenopathy.  Neurological: She is alert and oriented to person, place, and time.  Psychiatric: She has a normal mood and affect. Her behavior is normal.  Nursing note and vitals reviewed.  UC Treatments / Results  Labs (all labs ordered are listed, but only abnormal results are displayed) Labs Reviewed  RAPID STREP SCREEN (NOT AT [facility]) - Abnormal; Notable for the following components:      Result Value   Streptococcus, Group A Screen (Direct) POSITIVE (*)    All other components within normal limits   EKG  EKG Interpretation None      Radiology No results found.  Procedures Procedures (including critical care time)  Medications Ordered in UC Medications - No data to display   Initial Impression / Assessment and Plan / UC Course  I have reviewed the triage vital signs and the nursing notes.  Pertinent labs & imaging results that were available during my care of the patient were reviewed by me and considered in my medical decision making (see chart for details).    57 year old female presents with strep throat and otitis media.  Treating with Omnicef.  Did not use amoxicillin given interaction with methotrexate.  Final Clinical Impressions(s) / UC Diagnoses   Final diagnoses:  Strep pharyngitis  Left otitis media, unspecified  otitis media type   ED Discharge Orders        Ordered    cefdinir (OMNICEF) 300 MG capsule  2 times daily     04/28/17 1055     Controlled Substance Prescriptions [place] Controlled Substance Registry consulted? Not Applicable   Tommie Sams, DO 04/28/17 1127

## 2017-04-29 ENCOUNTER — Telehealth: Payer: Self-pay | Admitting: Emergency Medicine

## 2017-04-29 MED ORDER — PHENOL 1.4 % MT LIQD: 1.0000 | OROMUCOSAL | 0 refills | Status: DC | PRN

## 2017-04-29 NOTE — Telephone Encounter (Signed)
Patient notified that the prescription had been sent to her pharmacy for pick up.  Patient verbalized understanding.

## 2017-04-29 NOTE — Telephone Encounter (Signed)
Rx sent 

## 2017-04-29 NOTE — Telephone Encounter (Signed)
Patient's daughter called and asked if Dr. Adriana Simas wound be able to send in a medicine to help with her mother's sore throat.  Patient was diagnosed with Strep throat and is currently on an antibiotic.

## 2017-05-03 ENCOUNTER — Telehealth: Payer: Self-pay

## 2017-05-03 ENCOUNTER — Ambulatory Visit: Payer: Medicaid Other | Admitting: Psychiatry

## 2017-05-03 NOTE — Telephone Encounter (Signed)
Patient coming for lidocaine infusion on 05/04/17, will discuss at that time.

## 2017-05-03 NOTE — Telephone Encounter (Signed)
The patient called and said she is waiting on [name] to get prior authorization for her medicine. She has been out a few days and really needs it. She is coming in tomorrow for a lidocaine infusion.

## 2017-05-04 ENCOUNTER — Encounter: Payer: Self-pay | Admitting: Nurse Practitioner

## 2017-05-04 ENCOUNTER — Other Ambulatory Visit: Payer: Self-pay

## 2017-05-04 ENCOUNTER — Ambulatory Visit: Payer: Medicaid Other | Attending: Nurse Practitioner | Admitting: Nurse Practitioner

## 2017-05-04 VITALS — BP 148/84 | HR 76 | Temp 98.6°F | Resp 18 | Ht 66.0 in | Wt 336.0 lb

## 2017-05-04 DIAGNOSIS — M25531 Pain in right wrist: Secondary | ICD-10-CM | POA: Diagnosis present

## 2017-05-04 DIAGNOSIS — Z9851 Tubal ligation status: Secondary | ICD-10-CM | POA: Diagnosis not present

## 2017-05-04 DIAGNOSIS — M25532 Pain in left wrist: Secondary | ICD-10-CM | POA: Diagnosis present

## 2017-05-04 DIAGNOSIS — M79642 Pain in left hand: Secondary | ICD-10-CM | POA: Diagnosis not present

## 2017-05-04 DIAGNOSIS — M25562 Pain in left knee: Secondary | ICD-10-CM | POA: Diagnosis not present

## 2017-05-04 DIAGNOSIS — M25561 Pain in right knee: Secondary | ICD-10-CM | POA: Insufficient documentation

## 2017-05-04 DIAGNOSIS — M79641 Pain in right hand: Secondary | ICD-10-CM | POA: Insufficient documentation

## 2017-05-04 DIAGNOSIS — G894 Chronic pain syndrome: Secondary | ICD-10-CM | POA: Diagnosis not present

## 2017-05-04 DIAGNOSIS — Z9049 Acquired absence of other specified parts of digestive tract: Secondary | ICD-10-CM | POA: Diagnosis not present

## 2017-05-04 MED ORDER — MIDAZOLAM HCL 5 MG/5ML IJ SOLN
1.0000 mg | INTRAMUSCULAR | Status: DC | PRN
  Administered 2017-05-04: 1 mg via INTRAVENOUS

## 2017-05-04 MED ORDER — LIDOCAINE IN D5W 4-5 MG/ML-% IV SOLN
INTRAVENOUS | Status: AC
  Filled 2017-05-04: qty 500

## 2017-05-04 MED ORDER — LIDOCAINE BOLUS VIA INFUSION
54.0000 mg | Freq: Once | INTRAVENOUS | Status: AC
  Administered 2017-05-04: 54 mg via INTRAVENOUS

## 2017-05-04 MED ORDER — LIDOCAINE IN D5W 4-5 MG/ML-% IV SOLN: 4.0000 mg/min | INTRAVENOUS | Status: AC

## 2017-05-04 MED ORDER — MIDAZOLAM HCL 5 MG/5ML IJ SOLN
INTRAMUSCULAR | Status: AC
  Filled 2017-05-04: qty 5

## 2017-05-04 NOTE — Progress Notes (Signed)
Patient's Name: [name] [name]  MRN: [id number]  Referring Provider: [facility]  DOB: January 28, 1961  PCP: [facility]  DOS: 05/04/2017  Note by: Thad Ranger, NP  Service setting: Ambulatory outpatient  Specialty: Interventional Pain Management  Patient type: Established  Location: [facility] (AMB) Pain Management Facility  Visit type: Interventional Procedure   Primary Reason for Visit: Interventional Pain Management Treatment. CC: Hand Pain (bilaterally); Wrist Pain (bilaterally); and Knee Pain (bilaterally)  Procedure:  Anesthesia, Analgesia, Anxiolysis:  Type: Palliative Intravenous lidocaine infusion Region: Systemic Level: Upper Extremity IV access Laterality: Please see nurses note.  Type: Moderate (Conscious) Sedation Route: Intravenous (IV) IV Access: Secured Sedation: Meaningful verbal contact was maintained at all times during the procedure  Indication(s): Analgesia, anxiolysis, and seizure premedication  Indications: 1. Chronic pain syndrome    Pain Score: Pre-procedure: 2 /10 Post-procedure: 1 /10  Pre-op Assessment:  [name] [name] is a 57 y.o. (year old), female patient, seen today for interventional treatment. She  has a past surgical history that includes Tubal ligation; Tonsillectomy; Cholecystectomy; Abdominal hysterectomy; Laparoscopic gastric banding (03/20/07); Eye surgery; and Hernia repair. Ms. Dwelle has a current medication list which includes the following prescription(s): alprazolam, b-d insulin syringe 1cc/25g, buspirone, cholecalciferol, clotrimazole-betamethasone, famotidine, fluoxetine, folic acid, hydroxychloroquine, hydroxyzine, insulin aspart, insulin aspart protamine - aspart, klor-con m20, leflunomide, levothyroxine, liraglutide, lisinopril, loperamide, lovastatin, metformin, methotrexate, methotrexate, oxycodone, oxycodone, oxycodone-acetaminophen, phenol, prednisone, pregabalin, quetiapine, zolpidem, oxycodone, and promethazine, and the following  Facility-Administered Medications: lactated ringers and midazolam. Her primarily concern today is the Hand Pain (bilaterally); Wrist Pain (bilaterally); and Knee Pain (bilaterally)  Initial Vital Signs: Blood pressure 139/75, pulse 86, temperature 98.6 F (37 C), temperature source Oral, resp. rate 16, height 5\' 6"  (1.676 m), weight (!) 336 lb (152.4 kg), last menstrual period 04/20/2001, SpO2 95 %. BMI: Estimated body mass index is 54.23 kg/m as calculated from the following:   Height as of this encounter: 5\' 6"  (1.676 m).   Weight as of this encounter: 336 lb (152.4 kg).  Risk Assessment: Allergies: Reviewed. She is allergic to codeine; propoxyphene; sulfa antibiotics; and hydrocodone.  Allergy Precautions: None required Coagulopathies: Reviewed. None identified.  Blood-thinner therapy: None at this time Active Infection(s): Reviewed. None identified. [name] [name] is afebrile  Site Confirmation: [name] [name] was asked to confirm the procedure and laterality before marking the site Procedure checklist: Completed Consent: Before the procedure and under the influence of no sedative(s), amnesic(s), or anxiolytics, the patient was informed of the treatment options, risks and possible complications. To fulfill our ethical and legal obligations, as recommended by the American Medical Association's Code of Ethics, I have informed the patient of my clinical impression; the nature and purpose of the treatment or procedure; the risks, benefits, and possible complications of the intervention; the alternatives, including doing nothing; the risk(s) and benefit(s) of the alternative treatment(s) or procedure(s); and the risk(s) and benefit(s) of doing nothing. The patient was provided information about the general risks and possible complications associated with any invasive procedure. These may include, but are not limited to: failure to achieve desired goals; pain; worsening of initial condition; infections;  bleeding; organ or nerve damage; allergic reactions; and death. In addition, the patient was informed of those risks and complications associated to this procedure, such as failure to decrease pain; infection; bleeding; phlebitis; vascular extravasation; tissue necrosis; tenderness at IV access site; skin or nerve damage with subsequent damage to sensory, motor, and/or autonomic systems; worsening of the pain; persistent pain, numbness, and/or weakness  of one or several areas of the body; cardiac dysrhythmias; seizure; stroke; and/or death. Furthermore, the patient was informed of those risks and complications associated with the medications used during the procedure. These include, but are not limited to: allergic reactions (i.e.: anaphylactic or anaphylactoid reactions); cardiac conduction blockade; poisoning; toxicity; CNS depression; cardiovascular depression and collapse; muscle twitching; tonic-clonic seizures; convulsions; loss of consciousness; coma; respiratory depression; arrest; and/or death. Finally, the patient was informed that Medicine is not an exact science; therefore, there is also the possibility of unforeseen or unpredictable risks and/or possible complications that may result in a catastrophic outcome. The patient indicated having understood very clearly. We have given the patient no guarantees and we have made no promises. Enough time was given to the patient to ask questions, all of which were answered to the patient's satisfaction. [name] [name] has indicated that she wanted to continue with the procedure. Attestation: I, the ordering provider, attest that I have discussed with the patient the benefits, risks, side-effects, alternatives, likelihood of achieving goals, and potential problems during recovery for the procedure that I have provided informed consent. Date: 05/04/2017; Time: 10:14 AM  Pre-Procedure Preparation:  Monitoring: As per clinic protocol. Respiration, ETCO2, SpO2, BP,  heart rate and rhythm monitor placed and checked for adequate function Safety Precautions: Patient was assessed for positional comfort and pressure points before starting the procedure. Time-out: I initiated and conducted the "Time-out" before starting the procedure, as per protocol. The patient was asked to participate by confirming the accuracy of the "Time Out" information. Verification of the correct person, site, and procedure were performed and confirmed by me, the nursing staff, and the patient. "Time-out" conducted as per Joint Commission's Universal Protocol (UP.01.01.01). "Time-out" Date & Time: 05/04/2017; 1034 hrs.  Description of Procedure Process:   Position: Supine Target Area: Intravenous Approach: Intravenous angiocath approach. Area Prepped: Antecubital Prepping solution: Isopropyl Alcohol (70%) Safety Precautions: Medications properly checked for expiration dates. SDV (single dose vial) medications used.  Dose Calculation: Lidocaine preparation:2 grams of IV lidocaine in 500 mL of D5W (4 mg/mL) (0.4% Lidocaine) Maximum Lidocaine Dose:4 mg/kg x (!) 343 lb (155.6 kg)= 623mg .  Calculated infusion rate:623mg  divided by 4 mg/mL =169mL in 60 minutes (131mL/hr) Set rate:87mL/hr Duration of Infusion:1 hour   Description of the Procedure: Protocol guidelines were followed. The patient was placed in position. Informed consent was obtained.  She was cautioned to be on the look out for prodromal symptoms of a possible impending seizure, such as: new onset tinnitus; perioral, circumoral, or tongue numbness; or a metallic taste in her mouth, lightheadedness; dizziness; visual or auditory disturbances; disorientation; profound drowsiness; or muscle twitching. An IV was started and the patient's dose calculated as above. The infusion was carried out with a nurse at her side 100% of the time, monitoring her vitals as per protocol. Pre-procedure sedation was started 5-10 minutes  before infusion and midazolam was kept at bedside during the entire procedure, along with CPR equipment, as per protocol.  Vitals:   05/04/17 1143 05/04/17 1148 05/04/17 1155 05/04/17 1204  BP: 120/60 (!) 150/70 (!) 142/69 (!) 148/84  Pulse:      Resp: 10 12 18 18   Temp:      TempSrc:      SpO2: 96% 96% 94% 95%  Weight:      Height:        Start Time: 1054 hrs. End Time: 1154 hrs. Materials: IV infusion pump Medication(s): IV Lidocaine.  Imaging Guidance:  Type of Imaging  Technique: None used Indication(s): N/A Exposure Time: No patient exposure Contrast: None used. Fluoroscopic Guidance: N/A Ultrasound Guidance: N/A Interpretation: N/A  Antibiotic Prophylaxis:  Indication(s): None identified Antibiotic given: None  Post-operative Assessment:  EBL: None Complications: No immediate post-treatment complications observed by team, or reported by patient. Note: The patient tolerated the entire procedure well. A repeat set of vitals were taken after the procedure and the patient was kept under observation following institutional policy, for this type of procedure. Post-procedural neurological assessment was performed, showing return to baseline, prior to discharge. The patient was provided with post-procedure discharge instructions, including a section on how to identify potential problems. Should any problems arise concerning this procedure, the patient was given instructions to immediately contact us, at any time, without hesitation. In any case, we plan to contact the patient by telephone for a follow-up status report regarding this interventional procedure. Comments:  No additional relevant information.  Plan of Care   Procedure Orders    No procedure(s) ordered today    Medications ordered for procedure: Meds ordered this encounter  Medications  . lidocaine (XYLOCAINE) 4 mg/mL bolus via infusion 54 mg    Lidocaine preparation: 2 grams of IV lidocaine in 500 mL of D5W (4  mg/mL) (0.4% Lidocaine) Maximum Lidocaine Dose: 2 grams of IV lidocaine in 500 mL of D5W at a concentration 4 mg/mL (0.4% lidocaine) was slowly infused at a rate of 1 mL/min (4 mg/min) for approximately 54 minutes.  Duration of Inf  . midazolam (VERSED) 5 MG/5ML injection 1-2 mg    Make sure Flumazenil is available in the pyxis when using this medication. If oversedation occurs, administer 0.2 mg IV over 15 sec. If after 45 sec no response, administer 0.2 mg again over 1 min; may repeat at 1 min intervals; not to exceed 4 doses (1 mg)  . lidocaine (cardiac) 2000 mg in dextrose 5% 500 mL (4mg /mL) IV infusion    IV Lidocaine 2 grams in 500 mL of D5W. (4 mg/mL) (0.4% Lidocaine)   Medications administered: We administered lidocaine and midazolam.  See the medical record for exact dosing rate.  New Prescriptions   No medications on file   Disposition: Discharge home  Discharge Date & Time: 05/04/2017; 1220 hrs.   Physician-requested Follow-up: No Follow-up on file.  Future Appointments  Date Time Provider Department Center  05/12/2017 11:00 AM Barbette Merino, NP [facility] None   Primary Care Physician: [facility] Location: [facility] Note by: Thad Ranger, NP Date: 05/04/2017; Time: 3:40 PM  Disclaimer:  Medicine is not an exact science. The only guarantee in medicine is that nothing is guaranteed. It is important to note that the decision to proceed with this intervention was based on the information collected from the patient. The Data and conclusions were drawn from the patient's questionnaire, the interview, and the physical examination. Because the information was provided in large part by the patient, it cannot be guaranteed that it has not been purposely or unconsciously manipulated. Every effort has been made to obtain as much relevant data as possible for this evaluation. It is important to note that the conclusions that lead to this procedure  are derived in large part from the available data. Always take into account that the treatment will also be dependent on availability of resources and existing treatment guidelines, considered by other Pain Management Practitioners as being common knowledge and practice, at the time of the intervention. For Medico-Legal purposes, it is also  important to point out that variation in procedural techniques and pharmacological choices are the acceptable norm. The indications, contraindications, technique, and results of the above procedure should only be interpreted and judged by a Board-Certified Interventional Pain Specialist with extensive familiarity and expertise in the same exact procedure and technique.

## 2017-05-04 NOTE — Progress Notes (Signed)
Safety precautions to be maintained throughout the outpatient stay will include: orient to surroundings, keep bed in low position, maintain call bell within reach at all times, provide assistance with transfer out of bed and ambulation.  

## 2017-05-04 NOTE — Patient Instructions (Signed)
____________________________________________________________________________________________  Medication Rules  Applies to: All patients receiving prescriptions (written or electronic).  Pharmacy of record: Pharmacy where electronic prescriptions will be sent. If written prescriptions are taken to a different pharmacy, please inform the nursing staff. The pharmacy listed in the electronic medical record should be the one where you would like electronic prescriptions to be sent.  Prescription refills: Only during scheduled appointments. Applies to both, written and electronic prescriptions.  NOTE: The following applies primarily to controlled substances (Opioid* Pain Medications).   Patient's responsibilities: 1. Pain Pills: Bring all pain pills to every appointment (except for procedure appointments). 2. Pill Bottles: Bring pills in original pharmacy bottle. Always bring newest bottle. Bring bottle, even if empty. 3. Medication refills: You are responsible for knowing and keeping track of what medications you need refilled. The day before your appointment, write a list of all prescriptions that need to be refilled. Bring that list to your appointment and give it to the admitting nurse. Prescriptions will be written only during appointments. If you forget a medication, it will not be "Called in", "Faxed", or "electronically sent". You will need to get another appointment to get these prescribed. 4. Prescription Accuracy: You are responsible for carefully inspecting your prescriptions before leaving our office. Have the discharge nurse carefully go over each prescription with you, before taking them home. Make sure that your name is accurately spelled, that your address is correct. Check the name and dose of your medication to make sure it is accurate. Check the number of pills, and the written instructions to make sure they are clear and accurate. Make sure that you are given enough medication to  last until your next medication refill appointment. 5. Taking Medication: Take medication as prescribed. Never take more pills than instructed. Never take medication more frequently than prescribed. Taking less pills or less frequently is permitted and encouraged, when it comes to controlled substances (written prescriptions).  6. Inform other Doctors: Always inform, all of your healthcare providers, of all the medications you take. 7. Pain Medication from other Providers: You are not allowed to accept any additional pain medication from any other Doctor or Healthcare provider. There are two exceptions to this rule. (see below) In the event that you require additional pain medication, you are responsible for notifying us, as stated below. 8. Medication Agreement: You are responsible for carefully reading and following our Medication Agreement. This must be signed before receiving any prescriptions from our practice. Safely store a copy of your signed Agreement. Violations to the Agreement will result in no further prescriptions. (Additional copies of our Medication Agreement are available upon request.) 9. Laws, Rules, & Regulations: All patients are expected to follow all Federal and State Laws, Statutes, Rules, & Regulations. Ignorance of the Laws does not constitute a valid excuse. The use of any illegal substances is prohibited. 10. Adopted CDC guidelines & recommendations: Target dosing levels will be at or below 60 MME/day. Use of benzodiazepines** is not recommended.  Exceptions: There are only two exceptions to the rule of not receiving pain medications from other Healthcare Providers. 1. Exception #1 (Emergencies): In the event of an emergency (i.e.: accident requiring emergency care), you are allowed to receive additional pain medication. However, you are responsible for: As soon as you are able, call our office [phone], at any time of the day or night, and leave a message stating your  name, the date and nature of the emergency, and the name and dose of the medication   prescribed. In the event that your call is answered by a member of our staff, make sure to document and save the date, time, and the name of the person that took your information.  2. Exception #2 (Planned Surgery): In the event that you are scheduled by another doctor or dentist to have any type of surgery or procedure, you are allowed (for a period no longer than 30 days), to receive additional pain medication, for the acute post-op pain. However, in this case, you are responsible for picking up a copy of our "Post-op Pain Management for Surgeons" handout, and giving it to your surgeon or dentist. This document is available at our office, and does not require an appointment to obtain it. Simply go to our office during business hours (Monday-Thursday from 8:00 AM to 4:00 PM) (Friday 8:00 AM to 12:00 Noon) or if you have a scheduled appointment with us, prior to your surgery, and ask for it by name. In addition, you will need to provide us with your name, name of your surgeon, type of surgery, and date of procedure or surgery.  *Opioid medications include: morphine, codeine, oxycodone, oxymorphone, hydrocodone, hydromorphone, meperidine, tramadol, tapentadol, buprenorphine, fentanyl, methadone. **Benzodiazepine medications include: diazepam (Valium), alprazolam (Xanax), clonazepam (Klonopine), lorazepam (Ativan), clorazepate (Tranxene), chlordiazepoxide (Librium), estazolam (Prosom), oxazepam (Serax), temazepam (Restoril), triazolam (Halcion)  ____________________________________________________________________________________________   

## 2017-05-12 ENCOUNTER — Other Ambulatory Visit: Payer: Self-pay

## 2017-05-12 ENCOUNTER — Ambulatory Visit: Payer: Medicaid Other | Attending: Nurse Practitioner | Admitting: Nurse Practitioner

## 2017-05-12 ENCOUNTER — Encounter: Payer: Self-pay | Admitting: Nurse Practitioner

## 2017-05-12 VITALS — BP 151/86 | HR 78 | Temp 98.4°F | Resp 18 | Ht 66.0 in | Wt 336.0 lb

## 2017-05-12 DIAGNOSIS — J449 Chronic obstructive pulmonary disease, unspecified: Secondary | ICD-10-CM | POA: Insufficient documentation

## 2017-05-12 DIAGNOSIS — R339 Retention of urine, unspecified: Secondary | ICD-10-CM | POA: Insufficient documentation

## 2017-05-12 DIAGNOSIS — A0472 Enterocolitis due to Clostridium difficile, not specified as recurrent: Secondary | ICD-10-CM | POA: Diagnosis not present

## 2017-05-12 DIAGNOSIS — Z79890 Hormone replacement therapy (postmenopausal): Secondary | ICD-10-CM | POA: Insufficient documentation

## 2017-05-12 DIAGNOSIS — F419 Anxiety disorder, unspecified: Secondary | ICD-10-CM | POA: Insufficient documentation

## 2017-05-12 DIAGNOSIS — E1165 Type 2 diabetes mellitus with hyperglycemia: Secondary | ICD-10-CM | POA: Insufficient documentation

## 2017-05-12 DIAGNOSIS — E039 Hypothyroidism, unspecified: Secondary | ICD-10-CM | POA: Insufficient documentation

## 2017-05-12 DIAGNOSIS — M79642 Pain in left hand: Secondary | ICD-10-CM | POA: Diagnosis not present

## 2017-05-12 DIAGNOSIS — M79641 Pain in right hand: Secondary | ICD-10-CM | POA: Diagnosis not present

## 2017-05-12 DIAGNOSIS — G4733 Obstructive sleep apnea (adult) (pediatric): Secondary | ICD-10-CM | POA: Insufficient documentation

## 2017-05-12 DIAGNOSIS — E1142 Type 2 diabetes mellitus with diabetic polyneuropathy: Secondary | ICD-10-CM

## 2017-05-12 DIAGNOSIS — F319 Bipolar disorder, unspecified: Secondary | ICD-10-CM | POA: Insufficient documentation

## 2017-05-12 DIAGNOSIS — Z885 Allergy status to narcotic agent status: Secondary | ICD-10-CM | POA: Insufficient documentation

## 2017-05-12 DIAGNOSIS — F603 Borderline personality disorder: Secondary | ICD-10-CM | POA: Insufficient documentation

## 2017-05-12 DIAGNOSIS — G894 Chronic pain syndrome: Secondary | ICD-10-CM | POA: Insufficient documentation

## 2017-05-12 DIAGNOSIS — Z79891 Long term (current) use of opiate analgesic: Secondary | ICD-10-CM | POA: Insufficient documentation

## 2017-05-12 DIAGNOSIS — I5032 Chronic diastolic (congestive) heart failure: Secondary | ICD-10-CM | POA: Insufficient documentation

## 2017-05-12 DIAGNOSIS — M25521 Pain in right elbow: Secondary | ICD-10-CM | POA: Insufficient documentation

## 2017-05-12 DIAGNOSIS — M25531 Pain in right wrist: Secondary | ICD-10-CM | POA: Diagnosis not present

## 2017-05-12 DIAGNOSIS — M792 Neuralgia and neuritis, unspecified: Secondary | ICD-10-CM

## 2017-05-12 DIAGNOSIS — Z87891 Personal history of nicotine dependence: Secondary | ICD-10-CM | POA: Insufficient documentation

## 2017-05-12 DIAGNOSIS — G8929 Other chronic pain: Secondary | ICD-10-CM

## 2017-05-12 DIAGNOSIS — Z794 Long term (current) use of insulin: Secondary | ICD-10-CM | POA: Insufficient documentation

## 2017-05-12 DIAGNOSIS — Z9049 Acquired absence of other specified parts of digestive tract: Secondary | ICD-10-CM | POA: Insufficient documentation

## 2017-05-12 DIAGNOSIS — E876 Hypokalemia: Secondary | ICD-10-CM | POA: Insufficient documentation

## 2017-05-12 DIAGNOSIS — E559 Vitamin D deficiency, unspecified: Secondary | ICD-10-CM | POA: Insufficient documentation

## 2017-05-12 DIAGNOSIS — M546 Pain in thoracic spine: Secondary | ICD-10-CM | POA: Insufficient documentation

## 2017-05-12 DIAGNOSIS — N3946 Mixed incontinence: Secondary | ICD-10-CM | POA: Insufficient documentation

## 2017-05-12 DIAGNOSIS — M25512 Pain in left shoulder: Secondary | ICD-10-CM | POA: Insufficient documentation

## 2017-05-12 DIAGNOSIS — M25562 Pain in left knee: Secondary | ICD-10-CM | POA: Diagnosis not present

## 2017-05-12 DIAGNOSIS — M79643 Pain in unspecified hand: Secondary | ICD-10-CM

## 2017-05-12 DIAGNOSIS — M25529 Pain in unspecified elbow: Secondary | ICD-10-CM

## 2017-05-12 DIAGNOSIS — M25522 Pain in left elbow: Secondary | ICD-10-CM | POA: Diagnosis not present

## 2017-05-12 DIAGNOSIS — M25511 Pain in right shoulder: Secondary | ICD-10-CM | POA: Insufficient documentation

## 2017-05-12 DIAGNOSIS — M542 Cervicalgia: Secondary | ICD-10-CM | POA: Insufficient documentation

## 2017-05-12 DIAGNOSIS — M25532 Pain in left wrist: Secondary | ICD-10-CM | POA: Insufficient documentation

## 2017-05-12 DIAGNOSIS — I110 Hypertensive heart disease with heart failure: Secondary | ICD-10-CM | POA: Diagnosis not present

## 2017-05-12 DIAGNOSIS — K449 Diaphragmatic hernia without obstruction or gangrene: Secondary | ICD-10-CM | POA: Insufficient documentation

## 2017-05-12 DIAGNOSIS — M25561 Pain in right knee: Secondary | ICD-10-CM | POA: Insufficient documentation

## 2017-05-12 DIAGNOSIS — K219 Gastro-esophageal reflux disease without esophagitis: Secondary | ICD-10-CM | POA: Insufficient documentation

## 2017-05-12 DIAGNOSIS — Z5181 Encounter for therapeutic drug level monitoring: Secondary | ICD-10-CM | POA: Insufficient documentation

## 2017-05-12 DIAGNOSIS — E7801 Familial hypercholesterolemia: Secondary | ICD-10-CM | POA: Insufficient documentation

## 2017-05-12 DIAGNOSIS — M0579 Rheumatoid arthritis with rheumatoid factor of multiple sites without organ or systems involvement: Secondary | ICD-10-CM | POA: Insufficient documentation

## 2017-05-12 DIAGNOSIS — R350 Frequency of micturition: Secondary | ICD-10-CM | POA: Insufficient documentation

## 2017-05-12 DIAGNOSIS — G4700 Insomnia, unspecified: Secondary | ICD-10-CM | POA: Insufficient documentation

## 2017-05-12 DIAGNOSIS — Z6841 Body Mass Index (BMI) 40.0 and over, adult: Secondary | ICD-10-CM | POA: Insufficient documentation

## 2017-05-12 DIAGNOSIS — Z7952 Long term (current) use of systemic steroids: Secondary | ICD-10-CM | POA: Insufficient documentation

## 2017-05-12 DIAGNOSIS — K589 Irritable bowel syndrome without diarrhea: Secondary | ICD-10-CM | POA: Insufficient documentation

## 2017-05-12 DIAGNOSIS — Z882 Allergy status to sulfonamides status: Secondary | ICD-10-CM | POA: Insufficient documentation

## 2017-05-12 DIAGNOSIS — Z79899 Other long term (current) drug therapy: Secondary | ICD-10-CM | POA: Insufficient documentation

## 2017-05-12 MED ORDER — PREGABALIN 150 MG PO CAPS: 150.0000 mg | ORAL_CAPSULE | Freq: Three times a day (TID) | ORAL | 0 refills | Status: DC

## 2017-05-12 MED ORDER — OXYCODONE HCL 5 MG PO TABS: 5.0000 mg | ORAL_TABLET | Freq: Four times a day (QID) | ORAL | 0 refills | Status: DC | PRN

## 2017-05-12 MED ORDER — OXYCODONE HCL 5 MG PO TABS: ORAL_TABLET | ORAL | 0 refills | Status: DC

## 2017-05-12 NOTE — Progress Notes (Signed)
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 neuro-muscular anomalies detected.  Muscle Tone/Strength: Functionally intact. No obvious neuro-muscular anomalies detected.  Sensory (Neurological): Unimpaired  Sensory (Neurological): Unimpaired  Palpation: No palpable anomalies  Palpation: No palpable anomalies   Assessment  Primary Diagnosis & Pertinent Problem List: The primary encounter diagnosis was Rheumatoid arthritis involving multiple sites with positive rheumatoid factor ([place]). Diagnoses of Chronic hand pain, unspecified laterality, Chronic elbow pain (Location of Tertiary source of pain) (Bilateral) (L>R), Chronic neck pain (Bilateral) (R>L), Chronic pain syndrome, Neurogenic pain, Diabetic peripheral neuropathy ([place]), and Neuropathic pain were also pertinent to this visit.  Status Diagnosis  Controlled Controlled Controlled 1. Rheumatoid arthritis involving multiple sites with positive rheumatoid factor ([place])   2. Chronic hand pain, unspecified laterality   3. Chronic elbow pain  (Location of Tertiary source of pain) (Bilateral) (L>R)   4. Chronic neck pain (Bilateral) (R>L)   5. Chronic pain syndrome   6. Neurogenic pain   7. Diabetic peripheral neuropathy ([place])   8. Neuropathic pain     Problems updated and reviewed during this visit: No problems updated. Plan of Care  Pharmacotherapy (Medications Ordered): Meds ordered this encounter  Medications  . oxyCODONE (OXY IR/ROXICODONE) 5 MG immediate release tablet    Sig: Take 1 tablet (5 mg total) by mouth every 6 (six) hours as needed for severe pain.    Dispense:  120 tablet    Refill:  0    Do not place this medication, or any other prescription from our practice, on "Automatic Refill". Patient may have prescription filled one day early if pharmacy is closed on scheduled refill date. Do not fill until: 08/02/2017 To last until:09/01/2017    Order Specific Question:   Supervising Provider    Answer:   Milinda Pointer [phone]  . oxyCODONE (OXY IR/ROXICODONE) 5 MG immediate release tablet    Sig: TAKE 1 TABLET BY MOUTH EVERY 6 HOURS AS NEEDED SEVERE PAIN    Dispense:  120 tablet    Refill:  0    Do not place this medication, or any other prescription from our practice, on "Automatic Refill". Patient may have prescription filled one day early if pharmacy is closed on scheduled refill date. Do not fill until: 07/03/2017 To last until:08/02/2017    Order Specific Question:   Supervising Provider    Answer:   Milinda Pointer [phone]  . oxyCODONE (OXY IR/ROXICODONE) 5 MG immediate release tablet    Sig: Take 1 tablet (5 mg total) by mouth every 6 (six) hours as needed for severe pain.    Dispense:  120 tablet    Refill:  0    Do not place this medication, or any other prescription from our practice, on "Automatic Refill". Patient may have prescription filled one day early if pharmacy is closed on scheduled refill date. Do not fill until: 06/03/2017 To  last until:07/03/2017    Order Specific Question:    Supervising Provider    Answer:   Milinda Pointer [phone]  . pregabalin (LYRICA) 150 MG capsule    Sig: Take 1 capsule (150 mg total) by mouth 3 (three) times daily.    Dispense:  270 capsule    Refill:  0    Do not add this medication to the electronic "Automatic Refill" notification system. Patient may have prescription filled one day early if pharmacy is closed on scheduled refill date.    Order Specific Question:   Supervising Provider    Answer:   Milinda Pointer [[id number]]   New Prescriptions   No medications on file   Medications administered today: [name] had no medications administered during this visit. Lab-work, procedure(s), and/or referral(s): No orders of the defined types were placed in this encounter.  Imaging and/or referral(s): None  Interventional therapies: Planned, scheduled, and/or pending:  Not at this time.   Considering:  IV lidocaine infusions.  Diagnostic bilateral lumbar sympathetic block  Intra-articular injection with local anesthetic and steroids.  Diagnostic bilateral intra-articular shoulder joint injection  Diagnostic bilateral intra-articular knee injections with local anesthetic and steroid  Diagnostic right-sided cervical epidural steroid injections  Diagnostic bilateral Cervicalfacet block  Possible bilateral cervical facet radiofrequency ablation.    Palliative PRN treatment(s):  Palliative IV lidocaine infusion    Provider-requested follow-up: Return in about 3 months (around 08/10/2017) for MedMgmt with Me Donella Stade [facility]).  Future Appointments  Date Time Provider [place]  08/10/2017 10:30 AM Vevelyn Francois, NP [facility] None   Primary Care Physician: Care, [place] Primary Location: [facility] Out[facility] Facility Note by: Vevelyn Francois NP Date: 05/12/2017; Time: 2:30 PM  Pain Score Disclaimer: We use the NRS-11 scale. This is a self-reported, subjective measurement of pain severity with  only modest accuracy. It is used primarily to identify changes within a particular patient. It must be understood that outpatient pain scales are significantly less accurate that those used for research, where they can be applied under ideal controlled circumstances with minimal exposure to variables. In reality, the score is likely to be a combination of pain intensity and pain affect, where pain affect describes the degree of emotional arousal or changes in action readiness caused by the sensory experience of pain. Factors such as social and work situation, setting, emotional state, anxiety levels, expectation, and prior pain experience may influence pain perception and show large inter-individual differences that may also be affected by time variables.  Patient instructions provided during this appointment: Patient Instructions    ____________________________________________________________________________________________  Medication Rules  Applies to: All patients receiving prescriptions (written or electronic).  Pharmacy of record: Pharmacy where electronic prescriptions will be sent. If written prescriptions are taken to a different pharmacy, please inform the nursing staff. The pharmacy listed in the electronic medical record should be the one where you would like electronic prescriptions to be sent.  Prescription refills: Only during scheduled appointments. Applies to both, written and electronic prescriptions.  NOTE: The following applies primarily to controlled substances (Opioid* Pain Medications).   Patient's responsibilities: 1. Pain Pills: Bring all pain pills to every appointment (except for procedure appointments). 2. Pill Bottles: Bring pills in original pharmacy bottle. Always bring newest bottle. Bring bottle, even if empty. 3. Medication refills: You are responsible for knowing and keeping track of what medications you need refilled. The day before your appointment, write a  list of all prescriptions that need to be refilled.  Bring that list to your appointment and give it to the admitting nurse. Prescriptions will be written only during appointments. If you forget a medication, it will not be "Called in", "Faxed", or "electronically sent". You will need to get another appointment to get these prescribed. 4. Prescription Accuracy: You are responsible for carefully inspecting your prescriptions before leaving our office. Have the discharge nurse carefully go over each prescription with you, before taking them home. Make sure that your name is accurately spelled, that your address is correct. Check the name and dose of your medication to make sure it is accurate. Check the number of pills, and the written instructions to make sure they are clear and accurate. Make sure that you are given enough medication to last until your next medication refill appointment. 5. Taking Medication: Take medication as prescribed. Never take more pills than instructed. Never take medication more frequently than prescribed. Taking less pills or less frequently is permitted and encouraged, when it comes to controlled substances (written prescriptions).  6. Inform other Doctors: Always inform, all of your healthcare providers, of all the medications you take. 7. Pain Medication from other Providers: You are not allowed to accept any additional pain medication from any other Doctor or Healthcare provider. There are two exceptions to this rule. (see below) In the event that you require additional pain medication, you are responsible for notifying us, as stated below. 8. Medication Agreement: You are responsible for carefully reading and following our Medication Agreement. This must be signed before receiving any prescriptions from our practice. Safely store a copy of your signed Agreement. Violations to the Agreement will result in no further prescriptions. (Additional copies of our Medication Agreement are  available upon request.) 9. Laws, Rules, & Regulations: All patients are expected to follow all Federal and [employer], Rules, [employer]. Ignorance of the Laws does not constitute a valid excuse. The use of any illegal substances is prohibited. 10. Adopted CDC guidelines & recommendations: Target dosing levels will be at or below 60 MME/day. Use of benzodiazepines** is not recommended.  Exceptions: There are only two exceptions to the rule of not receiving pain medications from other Healthcare Providers. 1. Exception #1 (Emergencies): In the event of an emergency (i.e.: accident requiring emergency care), you are allowed to receive additional pain medication. However, you are responsible for: As soon as you are able, call our office [phone], at any time of the day or night, and leave a message stating your name, the date and nature of the emergency, and the name and dose of the medication prescribed. In the event that your call is answered by a member of our staff, make sure to document and save the date, time, and the name of the person that took your information.  2. Exception #2 (Planned Surgery): In the event that you are scheduled by another doctor or dentist to have any type of surgery or procedure, you are allowed (for a period no longer than 30 days), to receive additional pain medication, for the acute post-op pain. However, in this case, you are responsible for picking up a copy of our "Post-op Pain Management for Surgeons" handout, and giving it to your surgeon or dentist. This document is available at our office, and does not require an appointment to obtain it. Simply go to our office during business hours (Monday-Thursday from 8:00 AM to 4:00 PM) (Friday 8:00 AM to 12:00 Noon) or if you have a scheduled appointment with [name], prior  to your surgery, and ask for it by name. In addition, you will need to provide [name] with your name, name of your surgeon, type of surgery, and date  of procedure or surgery.  *Opioid medications include: morphine, codeine, oxycodone, oxymorphone, hydrocodone, hydromorphone, meperidine, tramadol, tapentadol, buprenorphine, fentanyl, methadone. **Benzodiazepine medications include: diazepam (Valium), alprazolam (Xanax), clonazepam (Klonopine), lorazepam (Ativan), clorazepate (Tranxene), chlordiazepoxide (Librium), estazolam (Prosom), oxazepam (Serax), temazepam (Restoril), triazolam (Halcion)  ____________________________________________________________________________________________  BMI interpretation table: BMI level Category Range association with higher incidence of chronic pain  <18 kg/m2 Underweight   18.5-24.9 kg/m2 Ideal body weight   25-29.9 kg/m2 Overweight Increased incidence by 20%  30-34.9 kg/m2 Obese (Class I) Increased incidence by 68%  35-39.9 kg/m2 Severe obesity (Class II) Increased incidence by 136%  >40 kg/m2 Extreme obesity (Class III) Increased incidence by 254%   BMI Readings from Last 4 Encounters:  05/12/17 54.23 kg/m  05/04/17 54.23 kg/m  04/28/17 54.88 kg/m  03/16/17 57.30 kg/m   Wt Readings from Last 4 Encounters:  05/12/17 (!) 336 lb (152.4 kg)  05/04/17 (!) 336 lb (152.4 kg)  04/28/17 (!) 340 lb (154.2 kg)  03/16/17 (!) 355 lb (161 kg)

## 2017-05-12 NOTE — Patient Instructions (Addendum)
____________________________________________________________________________________________  Medication Rules  Applies to: All patients receiving prescriptions (written or electronic).  Pharmacy of record: Pharmacy where electronic prescriptions will be sent. If written prescriptions are taken to a different pharmacy, please inform the nursing staff. The pharmacy listed in the electronic medical record should be the one where you would like electronic prescriptions to be sent.  Prescription refills: Only during scheduled appointments. Applies to both, written and electronic prescriptions.  NOTE: The following applies primarily to controlled substances (Opioid* Pain Medications).   Patient's responsibilities: 1. Pain Pills: Bring all pain pills to every appointment (except for procedure appointments). 2. Pill Bottles: Bring pills in original pharmacy bottle. Always bring newest bottle. Bring bottle, even if empty. 3. Medication refills: You are responsible for knowing and keeping track of what medications you need refilled. The day before your appointment, write a list of all prescriptions that need to be refilled. Bring that list to your appointment and give it to the admitting nurse. Prescriptions will be written only during appointments. If you forget a medication, it will not be "Called in", "Faxed", or "electronically sent". You will need to get another appointment to get these prescribed. 4. Prescription Accuracy: You are responsible for carefully inspecting your prescriptions before leaving our office. Have the discharge nurse carefully go over each prescription with you, before taking them home. Make sure that your name is accurately spelled, that your address is correct. Check the name and dose of your medication to make sure it is accurate. Check the number of pills, and the written instructions to make sure they are clear and accurate. Make sure that you are given enough medication to  last until your next medication refill appointment. 5. Taking Medication: Take medication as prescribed. Never take more pills than instructed. Never take medication more frequently than prescribed. Taking less pills or less frequently is permitted and encouraged, when it comes to controlled substances (written prescriptions).  6. Inform other Doctors: Always inform, all of your healthcare providers, of all the medications you take. 7. Pain Medication from other Providers: You are not allowed to accept any additional pain medication from any other Doctor or Healthcare provider. There are two exceptions to this rule. (see below) In the event that you require additional pain medication, you are responsible for notifying us, as stated below. 8. Medication Agreement: You are responsible for carefully reading and following our Medication Agreement. This must be signed before receiving any prescriptions from our practice. Safely store a copy of your signed Agreement. Violations to the Agreement will result in no further prescriptions. (Additional copies of our Medication Agreement are available upon request.) 9. Laws, Rules, & Regulations: All patients are expected to follow all Federal and State Laws, Statutes, Rules, & Regulations. Ignorance of the Laws does not constitute a valid excuse. The use of any illegal substances is prohibited. 10. Adopted CDC guidelines & recommendations: Target dosing levels will be at or below 60 MME/day. Use of benzodiazepines** is not recommended.  Exceptions: There are only two exceptions to the rule of not receiving pain medications from other Healthcare Providers. 1. Exception #1 (Emergencies): In the event of an emergency (i.e.: accident requiring emergency care), you are allowed to receive additional pain medication. However, you are responsible for: As soon as you are able, call our office [phone], at any time of the day or night, and leave a message stating your  name, the date and nature of the emergency, and the name and dose of the medication   prescribed. In the event that your call is answered by a member of our staff, make sure to document and save the date, time, and the name of the person that took your information.  2. Exception #2 (Planned Surgery): In the event that you are scheduled by another doctor or dentist to have any type of surgery or procedure, you are allowed (for a period no longer than 30 days), to receive additional pain medication, for the acute post-op pain. However, in this case, you are responsible for picking up a copy of our "Post-op Pain Management for Surgeons" handout, and giving it to your surgeon or dentist. This document is available at our office, and does not require an appointment to obtain it. Simply go to our office during business hours (Monday-Thursday from 8:00 AM to 4:00 PM) (Friday 8:00 AM to 12:00 Noon) or if you have a scheduled appointment with [name], prior to your surgery, and ask for it by name. In addition, you will need to provide [name] with your name, name of your surgeon, type of surgery, and date of procedure or surgery.  *Opioid medications include: morphine, codeine, oxycodone, oxymorphone, hydrocodone, hydromorphone, meperidine, tramadol, tapentadol, buprenorphine, fentanyl, methadone. **Benzodiazepine medications include: diazepam (Valium), alprazolam (Xanax), clonazepam (Klonopine), lorazepam (Ativan), clorazepate (Tranxene), chlordiazepoxide (Librium), estazolam (Prosom), oxazepam (Serax), temazepam (Restoril), triazolam (Halcion)  ____________________________________________________________________________________________  BMI interpretation table: BMI level Category Range association with higher incidence of chronic pain  <18 kg/m2 Underweight   18.5-24.9 kg/m2 Ideal body weight   25-29.9 kg/m2 Overweight Increased incidence by 20%  30-34.9 kg/m2 Obese (Class I) Increased incidence by 68%  35-39.9 kg/m2  Severe obesity (Class II) Increased incidence by 136%  >40 kg/m2 Extreme obesity (Class III) Increased incidence by 254%   BMI Readings from Last 4 Encounters:  05/12/17 54.23 kg/m  05/04/17 54.23 kg/m  04/28/17 54.88 kg/m  03/16/17 57.30 kg/m   Wt Readings from Last 4 Encounters:  05/12/17 (!) 336 lb (152.4 kg)  05/04/17 (!) 336 lb (152.4 kg)  04/28/17 (!) 340 lb (154.2 kg)  03/16/17 (!) 355 lb (161 kg)

## 2017-05-12 NOTE — Progress Notes (Signed)
Nursing Pain Medication Assessment:  Safety precautions to be maintained throughout the outpatient stay will include: orient to surroundings, keep bed in low position, maintain call bell within reach at all times, provide assistance with transfer out of bed and ambulation.  Medication Inspection Compliance: Pill count conducted under aseptic conditions, in front of the patient. Neither the pills nor the bottle was removed from the patient's sight at any time. Once count was completed pills were immediately returned to the patient in their original bottle.  Medication: Oxycodone/APAP Pill/Patch Count: 93 of 120 pills remain Pill/Patch Appearance: Markings consistent with prescribed medication Bottle Appearance: Standard pharmacy container. Clearly labeled. Filled Date: 01/17 / 2018 Last Medication intake:  Today

## 2017-05-20 ENCOUNTER — Other Ambulatory Visit: Payer: Self-pay | Admitting: Psychiatry

## 2017-05-30 ENCOUNTER — Ambulatory Visit (INDEPENDENT_AMBULATORY_CARE_PROVIDER_SITE_OTHER): Payer: Medicaid Other | Admitting: Psychiatry

## 2017-05-30 ENCOUNTER — Other Ambulatory Visit: Payer: Self-pay

## 2017-05-30 ENCOUNTER — Encounter: Payer: Self-pay | Admitting: Psychiatry

## 2017-05-30 VITALS — BP 132/77 | HR 92 | Temp 98.9°F | Wt 333.4 lb

## 2017-05-30 DIAGNOSIS — F411 Generalized anxiety disorder: Secondary | ICD-10-CM

## 2017-05-30 DIAGNOSIS — F331 Major depressive disorder, recurrent, moderate: Secondary | ICD-10-CM | POA: Diagnosis not present

## 2017-05-30 MED ORDER — ZOLPIDEM TARTRATE 5 MG PO TABS: 5.0000 mg | ORAL_TABLET | Freq: Every day | ORAL | 3 refills | Status: DC

## 2017-05-30 MED ORDER — QUETIAPINE FUMARATE 300 MG PO TABS: 300.0000 mg | ORAL_TABLET | Freq: Every day | ORAL | 3 refills | Status: DC

## 2017-05-30 MED ORDER — BUSPIRONE HCL 10 MG PO TABS: 10.0000 mg | ORAL_TABLET | Freq: Two times a day (BID) | ORAL | 3 refills | Status: DC

## 2017-05-30 MED ORDER — FLUOXETINE HCL 20 MG PO CAPS: 20.0000 mg | ORAL_CAPSULE | Freq: Every day | ORAL | 3 refills | Status: DC

## 2017-05-30 NOTE — Progress Notes (Signed)
Follow-up 57 year old woman with chronic anxiety.  Has had some stress since our last visit.  Her father died in 2023-04-25.  Although she did not have a good relationship with him this has brought up a lot of emotions particularly since she and her siblings are having to take care of his trailer.  Patient otherwise however says she is functioning well.  Sleeps well with current medicine.  Not feeling consistently depressed.  No suicidal thought no psychosis.  Neatly dressed and groomed woman.  Good eye contact normal psychomotor activity.  Affect appropriate to the situation.  No suicidality.  Tolerating medicine well.   renewed all of her current medicine and prescriptions have been sent into [facility].  Follow-up 3 months.

## 2017-05-31 ENCOUNTER — Ambulatory Visit: Admit: 2017-05-31 | Discharge: 2017-06-01 | Payer: MEDICAID

## 2017-05-31 DIAGNOSIS — M069 Rheumatoid arthritis, unspecified: Principal | ICD-10-CM

## 2017-06-06 MED ORDER — LEFLUNOMIDE 10 MG TABLET
ORAL_TABLET | Freq: Every day | ORAL | 2 refills | 0 days | Status: CP
Start: 2017-06-06 — End: 2017-08-25

## 2017-06-07 MED ORDER — METHOTREXATE SODIUM (PF) 25 MG/ML INJECTION SOLUTION
SUBCUTANEOUS | 3 refills | 0.00000 days | Status: CP
Start: 2017-06-07 — End: 2017-11-18

## 2017-06-21 ENCOUNTER — Ambulatory Visit: Admit: 2017-06-21 | Discharge: 2017-06-22 | Payer: MEDICAID | Attending: Family | Primary: Family

## 2017-06-21 DIAGNOSIS — E1165 Type 2 diabetes mellitus with hyperglycemia: Principal | ICD-10-CM

## 2017-06-21 DIAGNOSIS — G4733 Obstructive sleep apnea (adult) (pediatric): Secondary | ICD-10-CM

## 2017-06-21 DIAGNOSIS — F419 Anxiety disorder, unspecified: Secondary | ICD-10-CM

## 2017-06-21 DIAGNOSIS — E1149 Type 2 diabetes mellitus with other diabetic neurological complication: Secondary | ICD-10-CM

## 2017-06-21 DIAGNOSIS — J449 Chronic obstructive pulmonary disease, unspecified: Secondary | ICD-10-CM

## 2017-06-21 MED ORDER — DICLOFENAC 3 % TOPICAL GEL
Freq: Two times a day (BID) | TOPICAL | 1 refills | 0 days | Status: CP
Start: 2017-06-21 — End: 2017-11-28

## 2017-06-30 ENCOUNTER — Ambulatory Visit: Admit: 2017-06-30 | Discharge: 2017-07-01 | Payer: MEDICAID

## 2017-06-30 DIAGNOSIS — M069 Rheumatoid arthritis, unspecified: Principal | ICD-10-CM

## 2017-07-05 ENCOUNTER — Emergency Department: Payer: Medicaid Other

## 2017-07-05 ENCOUNTER — Encounter: Payer: Self-pay | Admitting: Emergency Medicine

## 2017-07-05 ENCOUNTER — Other Ambulatory Visit: Payer: Self-pay

## 2017-07-05 DIAGNOSIS — Y929 Unspecified place or not applicable: Secondary | ICD-10-CM | POA: Insufficient documentation

## 2017-07-05 DIAGNOSIS — F419 Anxiety disorder, unspecified: Secondary | ICD-10-CM | POA: Diagnosis not present

## 2017-07-05 DIAGNOSIS — S52532A Colles' fracture of left radius, initial encounter for closed fracture: Secondary | ICD-10-CM | POA: Diagnosis not present

## 2017-07-05 DIAGNOSIS — Z79899 Other long term (current) drug therapy: Secondary | ICD-10-CM | POA: Insufficient documentation

## 2017-07-05 DIAGNOSIS — Z794 Long term (current) use of insulin: Secondary | ICD-10-CM | POA: Insufficient documentation

## 2017-07-05 DIAGNOSIS — E119 Type 2 diabetes mellitus without complications: Secondary | ICD-10-CM | POA: Insufficient documentation

## 2017-07-05 DIAGNOSIS — F329 Major depressive disorder, single episode, unspecified: Secondary | ICD-10-CM | POA: Insufficient documentation

## 2017-07-05 DIAGNOSIS — Z950 Presence of cardiac pacemaker: Secondary | ICD-10-CM | POA: Diagnosis not present

## 2017-07-05 DIAGNOSIS — Z87891 Personal history of nicotine dependence: Secondary | ICD-10-CM | POA: Insufficient documentation

## 2017-07-05 DIAGNOSIS — W010XXA Fall on same level from slipping, tripping and stumbling without subsequent striking against object, initial encounter: Secondary | ICD-10-CM | POA: Diagnosis not present

## 2017-07-05 DIAGNOSIS — Z9049 Acquired absence of other specified parts of digestive tract: Secondary | ICD-10-CM | POA: Diagnosis not present

## 2017-07-05 DIAGNOSIS — I5032 Chronic diastolic (congestive) heart failure: Secondary | ICD-10-CM | POA: Diagnosis not present

## 2017-07-05 DIAGNOSIS — E039 Hypothyroidism, unspecified: Secondary | ICD-10-CM | POA: Insufficient documentation

## 2017-07-05 DIAGNOSIS — S6992XA Unspecified injury of left wrist, hand and finger(s), initial encounter: Secondary | ICD-10-CM | POA: Diagnosis present

## 2017-07-05 DIAGNOSIS — Z9884 Bariatric surgery status: Secondary | ICD-10-CM | POA: Insufficient documentation

## 2017-07-05 DIAGNOSIS — Y998 Other external cause status: Secondary | ICD-10-CM | POA: Diagnosis not present

## 2017-07-05 DIAGNOSIS — Y9389 Activity, other specified: Secondary | ICD-10-CM | POA: Diagnosis not present

## 2017-07-05 DIAGNOSIS — I110 Hypertensive heart disease with heart failure: Secondary | ICD-10-CM | POA: Insufficient documentation

## 2017-07-05 NOTE — ED Triage Notes (Signed)
Pt arrived to the ED for complaints of left arm pain secondary to a fall. Pt reports that she tripped and fell on her left arm and it has been hurting ever since. Pt denies LOC and no deformity is noticed on extremity. Pt is AOx4 in no apparent distress.

## 2017-07-06 ENCOUNTER — Emergency Department: Payer: Medicaid Other

## 2017-07-06 ENCOUNTER — Emergency Department
Admission: EM | Admit: 2017-07-06 | Discharge: 2017-07-06 | Disposition: A | Discharge status: Home / Self Care | Payer: Medicaid Other | Attending: Emergency Medicine | Admitting: Emergency Medicine

## 2017-07-06 ENCOUNTER — Telehealth: Payer: Self-pay | Admitting: Nurse Practitioner

## 2017-07-06 DIAGNOSIS — S52532A Colles' fracture of left radius, initial encounter for closed fracture: Secondary | ICD-10-CM

## 2017-07-06 MED ORDER — MORPHINE SULFATE (PF) 4 MG/ML IV SOLN
INTRAVENOUS | Status: AC
  Filled 2017-07-06: qty 1

## 2017-07-06 NOTE — Telephone Encounter (Signed)
Called pt and stated that we were unable to increase her pain meds due to broken wrist, ED would be responsible.

## 2017-07-06 NOTE — Telephone Encounter (Signed)
Patient broke wrist, went to ED was told she needed to call here to have our office increase her meds for pain. Please call patient asap

## 2017-07-06 NOTE — ED Provider Notes (Signed)
[facility] Emergency Department Provider Note   First MD Initiated Contact with Patient 07/06/17 [phone]     (approximate)  I have reviewed the triage vital signs and the nursing notes.   HISTORY  Chief Complaint Arm Pain    HPI [name] [name] is a 57 y.o. female presents to the emergency department history of accidental fall with resultant sharp throbbing left wrist pain and swelling.  Patient states her current pain score is 9 out of 10.  Patient denies any head injury.  Patient states the pain is worse with any movement of the left wrist.  Patient denies any alleviating factors.  Past Medical History:  Diagnosis Date  . Abdominal wall hernia 01/29/2013  . Anxiety   . Arthritis    Rheumatoid  . C. difficile colitis   . Chronic diastolic heart failure (HCC)   . Depression   . Diabetes mellitus    states no meds or diet restrictions  at present  . Diastolic CHF (HCC)   . Esophagitis   . Fluid retention   . GERD (gastroesophageal reflux disease)   . Hiatal hernia   . Hypertension   . Hypokalemia due to loss of potassium 10/21/2015   Overview:  Associated with 3 weeks of diarrhea  And QT prolongation.  . Hypothyroidism   . IBS (irritable bowel syndrome)   . Moderate episode of recurrent major depressive disorder (HCC) 06/03/2004  . Morbid obesity (HCC)   . Neurogenic bladder    has pacemaker  . Neuropathy   . Obesity   . Panic attacks   . Rheumatoid arthritis (HCC)   . Sleep apnea    STATES SEVERE, CANT TOLERATE MASK- LAST STUDY YEARS AGO    Patient Active Problem List   Diagnosis Date Noted  . Chronic pain syndrome 03/31/2016  . Insomnia secondary to chronic pain 03/31/2016  . Chronic upper back pain 12/25/2015  . Chronic hand pain (Bilateral) (L>R) 12/25/2015  . Rheumatoid arthritis (HCC) 12/25/2015  . Morbid obesity with BMI of 40.0-44.9, adult (HCC) 12/24/2015  . Osteoarthritis, multiple sites 12/24/2015  . Chronic foot pain  (Location of Primary Source of Pain) (Bilateral) (L>R) 12/24/2015  . Chronic elbow pain (Location of Tertiary source of pain) (Bilateral) (L>R) 12/24/2015  . Chronic shoulder pain (Bilateral) (L>R) 12/24/2015  . Chronic neck pain (Bilateral) (R>L) 12/24/2015  . 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[employer]) 12/23/2015  . Chronic knee pain (Bilateral) (R>L) 12/23/2015  . Long term current use of opiate analgesic 12/23/2015  . Long term prescription opiate use 12/23/2015  . Opiate use (30 MME/Day) 12/23/2015  . Neurogenic pain 12/23/2015  . Neuropathic pain 12/23/2015  . Diabetic peripheral neuropathy (HCC) 12/23/2015  . Encounter for therapeutic drug level monitoring 12/23/2015  . Encounter for pain management planning 12/23/2015  . GERD (gastroesophageal reflux disease) 11/25/2015  . Neuropathy 11/25/2015  . Hypokalemia 10/21/2015  . Hypomagnesemia 10/21/2015  . QT prolongation 10/21/2015  . Type 2 diabetes mellitus with hyperglycemia (HCC) 10/21/2015  . Chronic wrist pain (Location of Secondary source of pain) (Bilateral) (L>R) 03/19/2015  . 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 Female genuine stress incontinence 02/14/2015  . Urge incontinence of urine 02/14/2015  . Obstructive apnea 07/03/2014  . Chronic diastolic CHF (congestive heart failure), NYHA class 3 (HCC) 07/03/2014  . Anxiety 01/03/2014  . Diabetes mellitus (HCC) 01/03/2014  . Chronic obstructive pulmonary disease (HCC) 01/03/2014  . Bipolar affective disorder (HCC) 01/03/2014  . Diastolic dysfunction 01/03/2014  . Combined fat  and carbohydrate induced hyperlipemia 01/03/2014  . Shortness of breath 01/03/2014  . Incomplete bladder emptying 11/02/2012  . Bladder retention 10/10/2012  . Detrusor muscle hypertonia 10/04/2012  . Obstruction of urinary tract 10/04/2012  . FOM (frequency of micturition) 10/04/2012  . Vitamin D deficiency 04/15/2012  . Borderline personality disorder (HCC) 01/06/2012  . Mixed  anxiety depressive disorder 01/06/2012  . History of laparoscopic adjustable gastric banding, 03/20/2007.  Removed 09/19/2011. 08/04/2011  . Hypothyroidism 06/28/2010  . Essential (primary) hypertension 01/27/2005  . Major depressive disorder, recurrent episode, moderate (HCC) 06/03/2004    Past Surgical History:  Procedure Laterality Date  . ABDOMINAL HYSTERECTOMY    . CHOLECYSTECTOMY    . EYE SURGERY     bilateral cataract extraction with IOL  . HERNIA REPAIR     ventral hernia with strangulation  . LAPAROSCOPIC GASTRIC BANDING  03/20/07  . TONSILLECTOMY    . TUBAL LIGATION      Prior to Admission medications   Medication Sig Start Date End Date Taking? Authorizing Provider  ALPRAZolam (XANAX) 1 MG tablet TAKE 1 TABLET (1 MG TOTAL) BY MOUTH TAKE AS DIRECTED. FOR 1 DOSE 30 MINUTES PRIOR TO PROCEDURE. 03/15/17   [provider]  B-D INSULIN SYRINGE 1CC/25G 25G X 5/8" 1 ML MISC 1 UNITS BY MISCELLANEOUS ROUTE EVERY SEVEN (7) DAYS. TO BE USED WITH METHOTREXATE 12/10/16   [provider]  busPIRone (BUSPAR) 10 MG tablet Take 1 tablet (10 mg total) by mouth 2 (two) times daily. 05/30/17   Clapacs, Jackquline Denmark, MD  Cholecalciferol (VITAMIN D-1000 MAX ST) 1000 units tablet Take by mouth.    [provider]  clotrimazole-betamethasone (LOTRISONE) cream Apply 1 application topically 2 (two) times daily. For yeast infection under stomach 05/07/11   [provider]  famotidine (PEPCID) 20 MG tablet Take 20 mg by mouth daily.     [provider]  FLUoxetine (PROZAC) 20 MG capsule Take 1 capsule (20 mg total) by mouth daily. 05/30/17   Clapacs, Jackquline Denmark, MD  folic acid (FOLVITE) 1 MG tablet Take 1 mg by mouth daily.     [provider]  hydroxychloroquine (PLAQUENIL) 200 MG tablet Take 200 mg by mouth 2 (two) times daily.    [provider]  hydrOXYzine (ATARAX/VISTARIL) 50 MG tablet Take 50 mg by mouth Twice daily.  07/07/11   [provider]   Insulin Aspart (NOVOLOG [place]) Inject 10 Units into the skin 2 (two) times daily. Injection twice daily    [provider]  insulin aspart protamine - aspart (NOVOLOG MIX 70/30 FLEXPEN) (70-30) 100 UNIT/ML FlexPen Inject into the skin.    [provider]  KLOR-CON M20 20 MEQ tablet TAKE ONE TABLET BY MOUTH TWICE DAILY Patient taking differently: take 2 tabs in the a.m. , 1 tab in the evening 04/10/13   Iran Ouch, MD  leflunomide (ARAVA) 10 MG tablet Take 10 mg by mouth daily. 04/27/17   [provider]  levothyroxine (SYNTHROID, LEVOTHROID) 88 MCG tablet Take 88 mcg by mouth daily before breakfast.    [provider]  liraglutide (VICTOZA) 18 MG/3ML SOPN Inject into the skin.    [provider]  lisinopril (PRINIVIL,ZESTRIL) 2.5 MG tablet Take 2.5 mg by mouth daily.    [provider]  loperamide (IMODIUM) 2 MG capsule Take 2 mg by mouth as needed.  06/21/16   [provider]  lovastatin (MEVACOR) 40 MG tablet Take 40 mg by mouth daily.  [provider]  metFORMIN (GLUCOPHAGE) 500 MG tablet Take 500 mg by mouth 2 (two) times daily with a meal.     [provider]  methotrexate 50 MG/2ML injection Inject 1 mL into the muscle once a week.    [provider]  methotrexate 50 MG/2ML injection Inject into the muscle.    [provider]  oxyCODONE (OXY IR/ROXICODONE) 5 MG immediate release tablet Take 1 tablet (5 mg total) by mouth every 6 (six) hours as needed for severe pain. 08/02/17 09/01/17  Barbette Merino, NP  oxyCODONE (OXY IR/ROXICODONE) 5 MG immediate release tablet TAKE 1 TABLET BY MOUTH EVERY 6 HOURS AS NEEDED SEVERE PAIN 07/03/17 08/02/17  Barbette Merino, NP  oxyCODONE (OXY IR/ROXICODONE) 5 MG immediate release tablet Take 1 tablet (5 mg total) by mouth every 6 (six) hours as needed for severe pain. 06/03/17 07/03/17  Barbette Merino, NP  oxyCODONE-acetaminophen (PERCOCET/ROXICET) 5-325 MG  tablet Take by mouth.    [provider]  phenol (CHLORASEPTIC) 1.4 % LIQD Use as directed 1 spray in the mouth or throat every 2 (two) hours as needed for throat irritation / pain. 04/29/17   Tommie Sams, DO  predniSONE (DELTASONE) 10 MG tablet Take 40 mg by mouth daily as needed (Pain scale  Used to determine dosage). Reported on 08/29/2015    [provider]  pregabalin (LYRICA) 150 MG capsule Take 1 capsule (150 mg total) by mouth 3 (three) times daily. 05/12/17 08/10/17  Barbette Merino, NP  promethazine (PHENERGAN) 25 MG tablet Take 1 tablet (25 mg total) by mouth every 6 (six) hours as needed for nausea or vomiting. 11/10/16 04/28/17  Barbette Merino, NP  QUEtiapine (SEROQUEL) 300 MG tablet Take 1 tablet (300 mg total) by mouth at bedtime. 05/30/17   Clapacs, Jackquline Denmark, MD  zolpidem (AMBIEN) 5 MG tablet Take 1 tablet (5 mg total) by mouth at bedtime. 05/30/17   Clapacs, Jackquline Denmark, MD    Allergies Codeine; Propoxyphene; Sulfa antibiotics; and Hydrocodone  Family History  Problem Relation Age of Onset  . Heart failure Father   . Bipolar disorder Father   . Alcohol abuse Father   . Anxiety disorder Father   . Depression Father   . Heart disease Brother   . Heart attack Brother 59       MI s/p stents placed  . Anxiety disorder Sister   . Depression Sister   . Anxiety disorder Sister   . Depression Sister   . Bipolar disorder Sister   . Alcohol abuse Sister   . Drug abuse Sister   . Heart attack Brother     Social History Social History   Tobacco Use  . Smoking status: Former Smoker    Packs/day: 2.00    Years: 27.00    Pack years: 54.00    Types: Cigarettes    Last attempt to quit: 07/30/1999    Years since quitting: 17.9  . Smokeless tobacco: Never Used  Substance Use Topics  . Alcohol use: No  . Drug use: No    Review of Systems Constitutional: No fever/chills Eyes: No visual changes. ENT: No sore throat. Cardiovascular: Denies chest pain. Respiratory:  Denies shortness of breath. Gastrointestinal: No abdominal pain.  No nausea, no vomiting.  No diarrhea.  No constipation. Genitourinary: Negative for dysuria. Musculoskeletal: Negative for neck pain.  Negative for back pain.  Left wrist pain and swelling Integumentary: Negative for rash. Neurological: Negative for headaches, focal weakness  or numbness.   ____________________________________________   PHYSICAL EXAM:  VITAL SIGNS: ED Triage Vitals  Enc Vitals Group     BP 07/05/17 2242 (!) 164/91     Pulse Rate 07/05/17 2242 75     Resp 07/05/17 2242 18     Temp 07/05/17 2242 98.4 F (36.9 C)     Temp Source 07/05/17 2242 Oral     SpO2 07/05/17 2242 96 %     Weight 07/05/17 2243 (!) 150.1 kg (331 lb)     Height 07/05/17 2243 1.651 m (5\' 5" )     Head Circumference --      Peak Flow --      Pain Score 07/05/17 2243 6     Pain Loc --      Pain Edu? --      Excl. in GC? --     Constitutional: Alert and oriented.  Apparent discomfort eyes: Conjunctivae are normal.  Head: Atraumatic. Mouth/Throat: Mucous membranes are moist.  Oropharynx non-erythematous. Neck: No stridor.  No cervical spine tenderness to palpation. Cardiovascular: Normal rate, regular rhythm. Good peripheral circulation. Grossly normal heart sounds. Respiratory: Normal respiratory effort.  No retractions. Lungs CTAB. Gastrointestinal: Soft and nontender. No distention.  Musculoskeletal: Distal forearm pain and swelling.  Wrist pain with active and passive range of motion.  No left elbow or shoulder discomfort. Neurologic:  Normal speech and language. No gross focal neurologic deficits are appreciated.  Skin:  Skin is warm, dry and intact. No rash noted. Psychiatric: Mood and affect are normal. Speech and behavior are normal.  ____  RADIOLOGY  I, [place] Jeremih Dearmas, personally viewed and evaluated these images (plain radiographs) as part of my medical decision making, as well as reviewing the written report by  the radiologist.  ED MD interpretation: Buckle fracture left distal radius  Official radiology report(s): Dg Forearm Left  Result Date: 07/05/2017 CLINICAL DATA:  Initial evaluation for acute trauma, fall. EXAM: LEFT FOREARM - 2 VIEW COMPARISON:  Prior radiograph from 12/24/2015. FINDINGS: Cortical step-off with irregularity at the distal left radius, most consistent with an acute nondisplaced fracture. No other acute fracture or dislocation seen about the left radius or ulna. Radial head grossly intact. No acute soft tissue abnormality. IMPRESSION: 1. Acute nondisplaced fracture of the distal left radius. Further evaluation with dedicated radiograph of the left wrist suggested for complete evaluation. 2. No other acute traumatic injury about the forearm. Electronically Signed   By: 02/23/2016 M.D.   On: 07/05/2017 23:09     Procedures   ____________________________________________   INITIAL IMPRESSION / ASSESSMENT AND PLAN / ED COURSE  As part of my medical decision making, I reviewed the following data within the electronic MEDICAL RECORD NUMBER   57 year old female present with a positive history of physical exam secondary to accidental fall with resultant left wrist pain.  X-ray revealed an acute buckle fracture of the left distal radius     ____________________________________________  FINAL CLINICAL IMPRESSION(S) / ED DIAGNOSES  Final diagnoses:  Closed Colles' fracture of left radius, initial encounter     MEDICATIONS GIVEN DURING THIS VISIT:  Medications  morphine 4 MG/ML injection (not administered)     ED Discharge Orders    None       Note:  This document was prepared using Dragon voice recognition software and may include unintentional dictation errors.    54, MD 07/06/17 [phone]

## 2017-07-19 MED ORDER — INSULIN GLARGINE (U-100) 100 UNIT/ML SUBCUTANEOUS SOLUTION
Freq: Every evening | SUBCUTANEOUS | 3 refills | 0 days | Status: SS
Start: 2017-07-19 — End: 2017-10-06

## 2017-07-28 ENCOUNTER — Ambulatory Visit: Admit: 2017-07-28 | Discharge: 2017-07-29 | Payer: MEDICAID

## 2017-07-28 DIAGNOSIS — M069 Rheumatoid arthritis, unspecified: Principal | ICD-10-CM

## 2017-08-10 ENCOUNTER — Encounter: Payer: Medicaid Other | Admitting: Nurse Practitioner

## 2017-08-23 ENCOUNTER — Other Ambulatory Visit: Payer: Self-pay | Admitting: Psychiatry

## 2017-08-25 ENCOUNTER — Ambulatory Visit (INDEPENDENT_AMBULATORY_CARE_PROVIDER_SITE_OTHER): Payer: Medicaid Other | Admitting: Psychiatry

## 2017-08-25 ENCOUNTER — Encounter: Payer: Self-pay | Admitting: Psychiatry

## 2017-08-25 ENCOUNTER — Other Ambulatory Visit: Payer: Self-pay

## 2017-08-25 VITALS — BP 148/93 | HR 88 | Temp 98.7°F | Wt 339.2 lb

## 2017-08-25 DIAGNOSIS — F331 Major depressive disorder, recurrent, moderate: Secondary | ICD-10-CM

## 2017-08-25 DIAGNOSIS — F411 Generalized anxiety disorder: Secondary | ICD-10-CM | POA: Diagnosis not present

## 2017-08-25 MED ORDER — FLUOXETINE HCL 20 MG PO CAPS: 20.0000 mg | ORAL_CAPSULE | Freq: Every day | ORAL | 5 refills | Status: DC

## 2017-08-25 MED ORDER — ZOLPIDEM TARTRATE 5 MG PO TABS: 5.0000 mg | ORAL_TABLET | Freq: Every day | ORAL | 5 refills | Status: DC

## 2017-08-25 NOTE — Progress Notes (Signed)
Follow-up patient with chronic anxiety and depression.  Patient has no new complaints today.  Says she is feeling quite good.  Sleeping well.  Mood good.  Energy level adequate.  Patient is smiling upbeat lucid.  Neatly dressed and groomed.  Good eye contact.  Affect euthymic mood state it is good thoughts lucid no evidence of loosening of associations or delusions.  No evidence of dangerousness.  Judgment and insight intact.  Reviewed medications.  No change to anything as ordered.  Follow-up 6 months.

## 2017-08-26 MED ORDER — LEFLUNOMIDE 10 MG TABLET
ORAL_TABLET | Freq: Every day | ORAL | 2 refills | 0 days | Status: SS
Start: 2017-08-26 — End: 2018-03-09

## 2017-09-07 ENCOUNTER — Ambulatory Visit: Admit: 2017-09-07 | Discharge: 2017-09-07 | Payer: MEDICAID

## 2017-09-07 DIAGNOSIS — M069 Rheumatoid arthritis, unspecified: Principal | ICD-10-CM

## 2017-09-09 ENCOUNTER — Ambulatory Visit
Admission: EM | Admit: 2017-09-09 | Discharge: 2017-09-09 | Disposition: A | Discharge status: Home / Self Care | Payer: Medicaid Other | Attending: Family Medicine | Admitting: Family Medicine

## 2017-09-09 ENCOUNTER — Encounter: Payer: Self-pay | Admitting: *Deleted

## 2017-09-09 DIAGNOSIS — N3020 Other chronic cystitis without hematuria: Secondary | ICD-10-CM | POA: Insufficient documentation

## 2017-09-09 DIAGNOSIS — R300 Dysuria: Secondary | ICD-10-CM | POA: Diagnosis present

## 2017-09-09 LAB — URINALYSIS, COMPLETE (UACMP) WITH MICROSCOPIC
Bilirubin Urine: NEGATIVE
Glucose, UA: NEGATIVE mg/dL
Hgb urine dipstick: NEGATIVE
Ketones, ur: NEGATIVE mg/dL
Leukocytes, UA: NEGATIVE
Nitrite: NEGATIVE
Protein, ur: NEGATIVE mg/dL
Specific Gravity, Urine: 1.01 (ref 1.005–1.030)
pH: 5.5 (ref 5.0–8.0)

## 2017-09-09 MED ORDER — CEPHALEXIN 500 MG PO CAPS: 500.0000 mg | ORAL_CAPSULE | Freq: Two times a day (BID) | ORAL | 0 refills | Status: DC

## 2017-09-09 NOTE — Discharge Instructions (Signed)
Antibiotic as prescribed.  Take care  Dr. Roark Rufo  

## 2017-09-09 NOTE — ED Provider Notes (Signed)
[facility]    CSN: [id number] Arrival date & time: 09/09/17  1558  History   Chief Complaint Chief Complaint  Patient presents with  . Recurrent UTI   HPI   57 year old female with history of chronic cystitis/recurrent UTI presents with UTI symptoms.  Patient reports a 2-day history of dysuria and urinary frequency.  She is on no antibiotic prophylaxis at this time.  No fevers or chills.  She does report some "bladder pain".  No known exacerbating or relieving factors.  No other associated symptoms.  No other complaints.  Past Medical History:  Diagnosis Date  . Abdominal wall hernia 01/29/2013  . Anxiety   . Arthritis    Rheumatoid  . C. difficile colitis   . Chronic diastolic heart failure (HCC)   . Depression   . Diabetes mellitus    states no meds or diet restrictions  at present  . Diastolic CHF (HCC)   . Esophagitis   . Fluid retention   . GERD (gastroesophageal reflux disease)   . Hiatal hernia   . Hypertension   . Hypokalemia due to loss of potassium 10/21/2015   Overview:  Associated with 3 weeks of diarrhea  And QT prolongation.  . Hypothyroidism   . IBS (irritable bowel syndrome)   . Moderate episode of recurrent major depressive disorder (HCC) 06/03/2004  . Morbid obesity (HCC)   . Neurogenic bladder    has pacemaker  . Neuropathy   . Obesity   . Panic attacks   . Rheumatoid arthritis (HCC)   . Sleep apnea    STATES SEVERE, CANT TOLERATE MASK- LAST STUDY YEARS AGO    Patient Active Problem List   Diagnosis Date Noted  . Chronic pain syndrome 03/31/2016  . Insomnia secondary to chronic pain 03/31/2016  . Chronic upper back pain 12/25/2015  . Chronic hand pain (Bilateral) (L>R) 12/25/2015  . Rheumatoid arthritis (HCC) 12/25/2015  . Morbid obesity with BMI of 40.0-44.9, adult (HCC) 12/24/2015  . Osteoarthritis, multiple sites 12/24/2015  . Chronic foot pain (Location of Primary Source of Pain) (Bilateral) (L>R) 12/24/2015  . Chronic  elbow pain (Location of Tertiary source of pain) (Bilateral) (L>R) 12/24/2015  . Chronic shoulder pain (Bilateral) (L>R) 12/24/2015  . Chronic neck pain (Bilateral) (R>L) 12/24/2015  . 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[employer]) 12/23/2015  . Chronic knee pain (Bilateral) (R>L) 12/23/2015  . Long term current use of opiate analgesic 12/23/2015  . Long term prescription opiate use 12/23/2015  . Opiate use (30 MME/Day) 12/23/2015  . Neurogenic pain 12/23/2015  . Neuropathic pain 12/23/2015  . Diabetic peripheral neuropathy (HCC) 12/23/2015  . Encounter for therapeutic drug level monitoring 12/23/2015  . Encounter for pain management planning 12/23/2015  . GERD (gastroesophageal reflux disease) 11/25/2015  . Neuropathy 11/25/2015  . Hypokalemia 10/21/2015  . Hypomagnesemia 10/21/2015  . QT prolongation 10/21/2015  . Type 2 diabetes mellitus with hyperglycemia (HCC) 10/21/2015  . Chronic wrist pain (Location of Secondary source of pain) (Bilateral) (L>R) 03/19/2015  . 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 Female genuine stress incontinence 02/14/2015  . Urge incontinence of urine 02/14/2015  . Obstructive apnea 07/03/2014  . Chronic diastolic CHF (congestive heart failure), NYHA class 3 (HCC) 07/03/2014  . Anxiety 01/03/2014  . Diabetes mellitus (HCC) 01/03/2014  . Chronic obstructive pulmonary disease (HCC) 01/03/2014  . Bipolar affective disorder (HCC) 01/03/2014  . Diastolic dysfunction 01/03/2014  . Combined fat and carbohydrate induced hyperlipemia 01/03/2014  . Shortness of breath 01/03/2014  . Incomplete bladder  emptying 11/02/2012  . Bladder retention 10/10/2012  . Detrusor muscle hypertonia 10/04/2012  . Obstruction of urinary tract 10/04/2012  . FOM (frequency of micturition) 10/04/2012  . Vitamin D deficiency 04/15/2012  . Borderline personality disorder (HCC) 01/06/2012  . Mixed anxiety depressive disorder 01/06/2012  . History of laparoscopic adjustable  gastric banding, 03/20/2007.  Removed 09/19/2011. 08/04/2011  . Hypothyroidism 06/28/2010  . Essential (primary) hypertension 01/27/2005  . Major depressive disorder, recurrent episode, moderate (HCC) 06/03/2004   Past Surgical History:  Procedure Laterality Date  . ABDOMINAL HYSTERECTOMY    . CHOLECYSTECTOMY    . EYE SURGERY     bilateral cataract extraction with IOL  . HERNIA REPAIR     ventral hernia with strangulation  . LAPAROSCOPIC GASTRIC BANDING  03/20/07  . TONSILLECTOMY    . TUBAL LIGATION     OB History   None    Home Medications    Prior to Admission medications   Medication Sig Start Date End Date Taking? Authorizing Provider  ALPRAZolam (XANAX) 1 MG tablet TAKE 1 TABLET (1 MG TOTAL) BY MOUTH TAKE AS DIRECTED. FOR 1 DOSE 30 MINUTES PRIOR TO PROCEDURE. 03/15/17   [provider]  B-D INSULIN SYRINGE 1CC/25G 25G X 5/8" 1 ML MISC 1 UNITS BY MISCELLANEOUS ROUTE EVERY SEVEN (7) DAYS. TO BE USED WITH METHOTREXATE 12/10/16   [provider]  busPIRone (BUSPAR) 10 MG tablet TAKE 1 TABLET BY MOUTH TWICE A DAY 08/23/17   Clapacs, Jackquline Denmark, MD  cephALEXin (KEFLEX) 500 MG capsule Take 1 capsule (500 mg total) by mouth 2 (two) times daily. 09/09/17   Tommie Sams, DO  Cholecalciferol (VITAMIN D-1000 MAX ST) 1000 units tablet Take by mouth.    [provider]  clotrimazole-betamethasone (LOTRISONE) cream Apply 1 application topically 2 (two) times daily. For yeast infection under stomach 05/07/11   [provider]  famotidine (PEPCID) 20 MG tablet Take 20 mg by mouth daily.     [provider]  FLUoxetine (PROZAC) 20 MG capsule Take 1 capsule (20 mg total) by mouth daily. 08/25/17   Clapacs, Jackquline Denmark, MD  folic acid (FOLVITE) 1 MG tablet Take 1 mg by mouth daily.     [provider]  hydroxychloroquine (PLAQUENIL) 200 MG tablet Take 200 mg by mouth 2 (two) times daily.    [provider]  hydrOXYzine (ATARAX/VISTARIL) 50 MG tablet  Take 50 mg by mouth Twice daily.  07/07/11   [provider]  Insulin Aspart (NOVOLOG [place]) Inject 10 Units into the skin 2 (two) times daily. Injection twice daily    [provider]  insulin aspart protamine - aspart (NOVOLOG MIX 70/30 FLEXPEN) (70-30) 100 UNIT/ML FlexPen Inject into the skin.    [provider]  KLOR-CON M20 20 MEQ tablet TAKE ONE TABLET BY MOUTH TWICE DAILY Patient taking differently: take 2 tabs in the a.m. , 1 tab in the evening 04/10/13   Iran Ouch, MD  leflunomide (ARAVA) 10 MG tablet Take 10 mg by mouth daily. 04/27/17   [provider]  levothyroxine (SYNTHROID, LEVOTHROID) 88 MCG tablet Take 88 mcg by mouth daily before breakfast.    [provider]  liraglutide (VICTOZA) 18 MG/3ML SOPN Inject into the skin.    [provider]  lisinopril (PRINIVIL,ZESTRIL) 2.5 MG tablet Take 2.5 mg by mouth daily.    [provider]  loperamide (IMODIUM) 2 MG capsule Take 2 mg by mouth as needed.  06/21/16   [provider]  lovastatin (MEVACOR) 40 MG tablet Take 40 mg by mouth daily.     [provider]  metFORMIN (GLUCOPHAGE) 500 MG tablet Take 500 mg by mouth 2 (two) times daily with a meal.     [provider]  methotrexate 50 MG/2ML injection Inject 1 mL into the muscle once a week.    [provider]  methotrexate 50 MG/2ML injection Inject into the muscle.    [provider]  oxyCODONE (OXY IR/ROXICODONE) 5 MG immediate release tablet Take 1 tablet (5 mg total) by mouth every 6 (six) hours as needed for severe pain. 08/02/17 09/01/17  Barbette Merino, NP  oxyCODONE (OXY IR/ROXICODONE) 5 MG immediate release tablet TAKE 1 TABLET BY MOUTH EVERY 6 HOURS AS NEEDED SEVERE PAIN 07/03/17 08/02/17  Barbette Merino, NP  oxyCODONE (OXY IR/ROXICODONE) 5 MG immediate release tablet Take 1 tablet (5 mg total) by mouth every 6 (six) hours as needed for severe pain. 06/03/17 07/03/17  Barbette Merino, NP  oxyCODONE-acetaminophen (PERCOCET/ROXICET) 5-325 MG tablet Take by mouth.    [provider]  phenol (CHLORASEPTIC) 1.4 % LIQD Use as directed 1 spray in the mouth or throat every 2 (two) hours as needed for throat irritation / pain. 04/29/17   Tommie Sams, DO  predniSONE (DELTASONE) 10 MG tablet Take 40 mg by mouth daily as needed (Pain scale  Used to determine dosage). Reported on 08/29/2015    [provider]  pregabalin (LYRICA) 150 MG capsule Take 1 capsule (150 mg total) by mouth 3 (three) times daily. 05/12/17 08/10/17  Barbette Merino, NP  promethazine (PHENERGAN) 25 MG tablet Take 1 tablet (25 mg total) by mouth every 6 (six) hours as needed for nausea or vomiting. 11/10/16 04/28/17  Barbette Merino, NP  QUEtiapine (SEROQUEL) 300 MG tablet TAKE 1 TABLET BY MOUTH EVERYDAY AT BEDTIME 08/23/17   Clapacs, Jackquline Denmark, MD  zolpidem (AMBIEN) 5 MG tablet Take 1 tablet (5 mg total) by mouth at bedtime. 08/25/17   Clapacs, Jackquline Denmark, MD   Family History Family History  Problem Relation Age of Onset  . Heart failure Father   . Bipolar disorder Father   . Alcohol abuse Father   . Anxiety disorder Father   . Depression Father   . Heart disease Brother   . Heart attack Brother 77       MI s/p stents placed  . Anxiety disorder Sister   . Depression Sister   . Anxiety disorder Sister   . Depression Sister   . Bipolar disorder Sister   . Alcohol abuse Sister   . Drug abuse Sister   . Heart attack Brother    Social History Social History   Tobacco Use  . Smoking status: Former Smoker    Packs/day: 2.00    Years: 27.00    Pack years: 54.00    Types: Cigarettes    Last attempt to quit: 07/30/1999    Years since quitting: 18.1  . Smokeless tobacco: Never Used  Substance Use Topics  . Alcohol use: No  . Drug use: No   Allergies   Codeine; Propoxyphene; Sulfa antibiotics; and Hydrocodone  Review of Systems Review of Systems  Constitutional: Negative.     Genitourinary: Positive for dysuria and frequency.   Physical Exam Triage Vital Signs ED Triage Vitals  Enc Vitals Group     BP 09/09/17 1606 (!) 139/92     Pulse Rate 09/09/17 1606 86  Resp 09/09/17 1606 16     Temp 09/09/17 1606 98.6 F (37 C)     Temp Source 09/09/17 1606 Oral     SpO2 09/09/17 1606 95 %     Weight 09/09/17 1610 (!) 332 lb (150.6 kg)     Height 09/09/17 1610 5\' 6"  (1.676 m)     Head Circumference --      Peak Flow --      Pain Score 09/09/17 1609 3     Pain Loc --      Pain Edu? --      Excl. in GC? --    Updated Vital Signs BP (!) 139/92 (BP Location: Right Arm)   Pulse 86   Temp 98.6 F (37 C) (Oral)   Resp 16   Ht 5\' 6"  (1.676 m)   Wt (!) 332 lb (150.6 kg)   LMP 04/20/2001   SpO2 95%   BMI 53.59 kg/m   Physical Exam  Constitutional: She is oriented to person, place, and time. She appears well-developed. No distress.  Cardiovascular: Normal rate and regular rhythm.  Pulmonary/Chest: Effort normal and breath sounds normal. She has no wheezes. She has no rales.  Abdominal: Soft.  Mild tenderness in the suprapubic region.  Neurological: She is alert and oriented to person, place, and time.  Psychiatric: She has a normal mood and affect. Her behavior is normal.  Nursing note and vitals reviewed.  UC Treatments / Results  Labs (all labs ordered are listed, but only abnormal results are displayed) Labs Reviewed  URINALYSIS, COMPLETE (UACMP) WITH MICROSCOPIC - Abnormal; Notable for the following components:      Result Value   APPearance CLOUDY (*)    Bacteria, UA MANY (*)    All other components within normal limits  URINE CULTURE    EKG None  Radiology No results found.  Procedures Procedures (including critical care time)  Medications Ordered in UC Medications - No data to display  Initial Impression / Assessment and Plan / UC Course  I have reviewed the triage vital signs and the nursing notes.  Pertinent labs & imaging  results that were available during my care of the patient were reviewed by me and considered in my medical decision making (see chart for details).    57 year old female presents with chronic cystitis.  Appears to have UTI currently.  Sending culture.  Placed on Keflex.  Final Clinical Impressions(s) / UC Diagnoses   Final diagnoses:  Chronic cystitis     Discharge Instructions     Antibiotic as prescribed.  Take care  Dr. 06/18/2001    ED Prescriptions    Medication Sig Dispense Auth. Provider   cephALEXin (KEFLEX) 500 MG capsule Take 1 capsule (500 mg total) by mouth 2 (two) times daily. 14 capsule 59, DO     Controlled Substance Prescriptions [place] Controlled Substance Registry consulted? Not Applicable   Adriana Simas, DO 09/09/17 1658

## 2017-09-14 ENCOUNTER — Ambulatory Visit: Payer: Medicaid Other | Attending: Nurse Practitioner | Admitting: Nurse Practitioner

## 2017-09-14 ENCOUNTER — Other Ambulatory Visit: Payer: Self-pay

## 2017-09-14 ENCOUNTER — Encounter: Payer: Self-pay | Admitting: Nurse Practitioner

## 2017-09-14 VITALS — BP 131/87 | HR 79 | Temp 98.7°F | Resp 16 | Ht 65.5 in | Wt 332.0 lb

## 2017-09-14 DIAGNOSIS — Z87891 Personal history of nicotine dependence: Secondary | ICD-10-CM | POA: Insufficient documentation

## 2017-09-14 DIAGNOSIS — M792 Neuralgia and neuritis, unspecified: Secondary | ICD-10-CM | POA: Diagnosis not present

## 2017-09-14 DIAGNOSIS — M79673 Pain in unspecified foot: Secondary | ICD-10-CM | POA: Diagnosis not present

## 2017-09-14 DIAGNOSIS — E039 Hypothyroidism, unspecified: Secondary | ICD-10-CM | POA: Diagnosis not present

## 2017-09-14 DIAGNOSIS — Z79899 Other long term (current) drug therapy: Secondary | ICD-10-CM | POA: Diagnosis not present

## 2017-09-14 DIAGNOSIS — Z794 Long term (current) use of insulin: Secondary | ICD-10-CM | POA: Diagnosis not present

## 2017-09-14 DIAGNOSIS — M79642 Pain in left hand: Secondary | ICD-10-CM | POA: Diagnosis not present

## 2017-09-14 DIAGNOSIS — K219 Gastro-esophageal reflux disease without esophagitis: Secondary | ICD-10-CM | POA: Insufficient documentation

## 2017-09-14 DIAGNOSIS — G894 Chronic pain syndrome: Secondary | ICD-10-CM | POA: Insufficient documentation

## 2017-09-14 DIAGNOSIS — M25531 Pain in right wrist: Secondary | ICD-10-CM | POA: Insufficient documentation

## 2017-09-14 DIAGNOSIS — Z9889 Other specified postprocedural states: Secondary | ICD-10-CM | POA: Diagnosis not present

## 2017-09-14 DIAGNOSIS — G8929 Other chronic pain: Secondary | ICD-10-CM

## 2017-09-14 DIAGNOSIS — M19012 Primary osteoarthritis, left shoulder: Secondary | ICD-10-CM | POA: Diagnosis not present

## 2017-09-14 DIAGNOSIS — Z90710 Acquired absence of both cervix and uterus: Secondary | ICD-10-CM | POA: Insufficient documentation

## 2017-09-14 DIAGNOSIS — I110 Hypertensive heart disease with heart failure: Secondary | ICD-10-CM | POA: Insufficient documentation

## 2017-09-14 DIAGNOSIS — F319 Bipolar disorder, unspecified: Secondary | ICD-10-CM | POA: Insufficient documentation

## 2017-09-14 DIAGNOSIS — Z79891 Long term (current) use of opiate analgesic: Secondary | ICD-10-CM

## 2017-09-14 DIAGNOSIS — M0579 Rheumatoid arthritis with rheumatoid factor of multiple sites without organ or systems involvement: Secondary | ICD-10-CM | POA: Insufficient documentation

## 2017-09-14 DIAGNOSIS — I5033 Acute on chronic diastolic (congestive) heart failure: Secondary | ICD-10-CM | POA: Insufficient documentation

## 2017-09-14 DIAGNOSIS — J449 Chronic obstructive pulmonary disease, unspecified: Secondary | ICD-10-CM | POA: Insufficient documentation

## 2017-09-14 DIAGNOSIS — M25532 Pain in left wrist: Secondary | ICD-10-CM | POA: Diagnosis present

## 2017-09-14 DIAGNOSIS — Z9049 Acquired absence of other specified parts of digestive tract: Secondary | ICD-10-CM | POA: Diagnosis not present

## 2017-09-14 DIAGNOSIS — E1142 Type 2 diabetes mellitus with diabetic polyneuropathy: Secondary | ICD-10-CM | POA: Diagnosis not present

## 2017-09-14 DIAGNOSIS — Z9851 Tubal ligation status: Secondary | ICD-10-CM | POA: Diagnosis not present

## 2017-09-14 DIAGNOSIS — M79641 Pain in right hand: Secondary | ICD-10-CM | POA: Diagnosis not present

## 2017-09-14 MED ORDER — PREGABALIN 150 MG PO CAPS: 150.0000 mg | ORAL_CAPSULE | Freq: Three times a day (TID) | ORAL | 0 refills | Status: DC

## 2017-09-14 MED ORDER — OXYCODONE HCL 5 MG PO TABS: 5.0000 mg | ORAL_TABLET | Freq: Four times a day (QID) | ORAL | 0 refills | Status: DC | PRN

## 2017-09-14 MED ORDER — OXYCODONE HCL 5 MG PO TABS
5.0000 mg | ORAL_TABLET | Freq: Four times a day (QID) | ORAL | 0 refills | Status: DC | PRN
Start: 2017-09-14 — End: 2017-10-31

## 2017-09-14 MED ORDER — OXYCODONE HCL 5 MG PO TABS: ORAL_TABLET | ORAL | 0 refills | Status: DC

## 2017-09-14 NOTE — Progress Notes (Signed)
Nursing Pain Medication Assessment:  °Safety precautions to be maintained throughout the outpatient stay will include: orient to surroundings, keep bed in low position, maintain call bell within reach at all times, provide assistance with transfer out of bed and ambulation.  °Medication Inspection Compliance: Ms. Marlowe did not comply with our request to bring her pills to be counted. She was reminded that bringing the medication bottles, even when empty, is a requirement. ° °Medication: None brought in. °Pill/Patch Count: None available to be counted. °Bottle Appearance: No container available. Did not bring bottle(s) to appointment. °Filled Date: N/A °Last Medication intake:  Today °

## 2017-09-14 NOTE — Progress Notes (Signed)
Patient's Name: [name] [name]  MRN: [id number]  Referring Provider: Care, [place] [facility]  DOB: Oct 06, 1960  PCP: [facility] [place] [facility]  DOS: 09/14/2017  Note by: Vevelyn Francois NP  Service setting: Ambulatory outpatient  Specialty: Interventional Pain Management  Location: [facility] (AMB) Pain Management Facility    Patient type: Established    Primary Reason(s) for Visit: Evaluation of chronic illnesses with exacerbation, or progression (Level of risk: moderate) CC: Wrist Pain (bilateral) and Hand Pain (bilateral- radiates to fingers)  HPI  [name] is a 57 y.o. year old, female patient, who comes today for a follow-up evaluation. She has History of laparoscopic adjustable gastric banding, 03/20/2007.  Removed 09/19/2011.; Anxiety; Diabetes ([place]); COPD (chronic obstructive pulmonary disease) ([place]); Bipolar disorder, unspecified ([place]); Essential (primary) hypertension; Diastolic dysfunction; Major depressive disorder, recurrent episode, moderate ([place]); Incomplete bladder emptying; Obstructive apnea; Combined fat and carbohydrate induced hyperlipemia; Bladder retention; Detrusor muscle hypertonia; Female genuine stress incontinence; Shortness of breath; Obstruction of urinary tract; FOM (frequency of micturition); Urge incontinence of urine; Chronic wrist pain (Location of Secondary source of pain) (Bilateral) (L>R); Adhesive capsulitis; Borderline personality disorder ([place]); Chronic diastolic CHF (congestive heart failure), NYHA class 3 (HCC); GERD (gastroesophageal reflux disease); Hypokalemia; Hypomagnesemia; Mixed anxiety depressive disorder; QT prolongation; Type 2 diabetes mellitus with hyperglycemia ([place]); Vitamin D deficiency; 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[employer]); Chronic knee pain (Bilateral) (R>L); Long term current use of opiate analgesic; Long term prescription opiate use; Opiate use (30 MME/Day); Neurogenic pain; Neuropathic pain; Diabetic peripheral neuropathy ([place]);  Encounter for therapeutic drug level monitoring; Encounter for pain management planning; Morbid (severe) obesity due to excess calories ([place]); Osteoarthritis, multiple sites; Chronic foot pain (Location of Primary Source of Pain) (Bilateral) (L>R); Chronic elbow pain (Location of Tertiary source of pain) (Bilateral) (L>R); Chronic shoulder pain (Bilateral) (L>R); Chronic neck pain (Bilateral) (R>L); Chronic upper back pain; Chronic hand pain (Bilateral) (L>R); Rheumatoid arthritis ([place]); Chronic pain syndrome; Insomnia secondary to chronic pain; Hypothyroidism; Neuropathy; Chronic cystitis; and Acute on chronic congestive heart failure ([place]) on their problem list. Ms. Milliner was last seen on 08/10/2017. Her primarily concern today is the Wrist Pain (bilateral) and Hand Pain (bilateral- radiates to fingers)  Pain Assessment: Location: Right, Left Hand Radiating: wrists, fingers Onset: More than a month ago Duration: Chronic pain Quality: Sharp Severity: 4 /10 (subjective, self-reported pain score)  Note: Reported level is compatible with observation.                          Timing: Constant Modifying factors: medications BP: 131/87  HR: 79  Further details on both, my assessment(s), as well as the proposed treatment plan, please see below. She admits that she missed her last apt. She also suffered a fall in April and fracutred her left wrist. She admits that she has a IT trainer wheelchair and she tried to push it an it threw her in the road. She wore a cast for six weeks.   Laboratory Chemistry  Inflammation Markers (CRP: Acute Phase) (ESR: Chronic Phase) Lab Results  Component Value Date   CRP 0.6 12/24/2015   ESRSEDRATE 27 11/10/2016                         Rheumatology Markers Lab Results  Component Value Date   RF 114.0 (H) 12/24/2015   LABURIC 6.9 11/10/2016  Renal Function Markers Lab Results  Component Value Date   BUN 11 08/15/2016   CREATININE 0.49  08/15/2016   GFRAA >60 08/15/2016   GFRNONAA >60 08/15/2016                              Hepatic Function Markers Lab Results  Component Value Date   AST 21 08/15/2016   ALT 14 08/15/2016   ALBUMIN 3.6 08/15/2016   ALKPHOS 70 08/15/2016   LIPASE 18 08/15/2016                        Electrolytes Lab Results  Component Value Date   NA 138 08/15/2016   K 3.4 (L) 08/15/2016   CL 104 08/15/2016   CALCIUM 9.2 08/15/2016   MG 1.8 12/24/2015                        Neuropathy Markers Lab Results  Component Value Date   VITAMINB12 287 12/24/2015   HGBA1C 7.1 (H) 12/24/2015                        Bone Pathology Markers Lab Results  Component Value Date   25OHVITD1 39 12/24/2015   25OHVITD2 27 12/24/2015   25OHVITD3 12 12/24/2015                         Coagulation Parameters Lab Results  Component Value Date   PLT 231 08/15/2016                        Cardiovascular Markers Lab Results  Component Value Date   BNP 25.0 12/24/2015   CKTOTAL 44 02/22/2013   CKMB < 0.5 (L) 02/22/2013   TROPONINI <0.03 08/15/2016   HGB 13.4 08/15/2016   HCT 39.4 08/15/2016                         CA Markers No results found for: CEA, CA125, LABCA2                      Note: Lab results reviewed.  Recent Diagnostic Imaging Review  Cervical Imaging:  Results for orders placed during the hospital encounter of 12/24/15  DG Cervical Spine Complete   Narrative CLINICAL DATA:  Pt has had chronic neck, bilateral shoulders, bilateral elbows, and bilateral wrist pain since 1994; pt diagnosed with RA in 2004;  No surgeries, no injuries;  EXAM: CERVICAL SPINE - COMPLETE 4+ VIEW  COMPARISON:  None.  FINDINGS: No fracture.  No spondylolisthesis.  No bone lesion.  No widening of the atlantoaxial interval.  Minor loss disc height at C5-C6 and C6-C7. Neural foramina are suboptimally imaged on the right due to over obliquity. Probable mild narrowing of the right C4-C5 neural  foramen. Mild narrowing of the left C3-C4 neural foramina from uncovertebral spurring. No other stenosis.  Soft tissues are unremarkable.  IMPRESSION: 1. No fracture or acute finding. 2. Mild degenerative changes.   Electronically Signed   By: Lajean Manes M.D.   On: 12/24/2015 11:18      Shoulder Imaging:  Shoulder-R DG:  Results for orders placed during the hospital encounter of 12/24/15  DG Shoulder Right   Narrative CLINICAL DATA:  Pt has had chronic neck, bilateral shoulders, bilateral elbows,  and bilateral wrist pain since 1994; pt diagnosed with RA in 2004;  No surgeries, no injuries;  EXAM: RIGHT SHOULDER - 2+ VIEW  COMPARISON:  None.  FINDINGS: There is no evidence of fracture or dislocation. There is no evidence of arthropathy or other focal bone abnormality. Soft tissues are unremarkable.  IMPRESSION: Negative.   Electronically Signed   By: Lajean Manes M.D.   On: 12/24/2015 11:23    Shoulder-L DG:  Results for orders placed during the hospital encounter of 12/24/15  DG Shoulder Left   Narrative CLINICAL DATA:  Pt has had chronic neck, bilateral shoulders, bilateral elbows, and bilateral wrist pain since 1994; pt diagnosed with RA in 2004;  No surgeries, no injuries;  EXAM: LEFT SHOULDER - 2+ VIEW  COMPARISON:  None.  FINDINGS: No fracture.  No bone lesion.  Glenohumeral joint is normally spaced and aligned without arthropathic change. There are minor degenerative changes at the [facility] joint. No AC joint erosions.  Soft tissues are unremarkable.  IMPRESSION: 1. No fracture or acute findings. No evidence of an inflammatory arthropathy. 2. Minor AC joint osteoarthritis.   Electronically Signed   By: Lajean Manes M.D.   On: 12/24/2015 11:22    Hip Imaging:  Hip-R DG 2-3 views:  Results for orders placed during the hospital encounter of 02/20/16  DG HIP UNILAT WITH PELVIS 2-3 VIEWS RIGHT   Narrative CLINICAL DATA:  57 y/o F;  right hip pain started yesterday and has continued to worsen.  EXAM: DG HIP (WITH OR WITHOUT PELVIS) 2-3V RIGHT  COMPARISON:  None.  FINDINGS: No acute fracture or dislocation is identified. Minimal osteoarthrosis of the hip joints bilaterally with productive changes in superolateral acetabula. Right femur greater trochanter small enthesophyte. Sacral stimulator device projecting over left hemipelvis with electrode along the left sacral ala. Pelvic phleboliths.  IMPRESSION: No acute fracture or dislocation identified. Minimal bilateral hip osteoarthrosis.   Electronically Signed   By: Kristine Garbe M.D.   On: 02/20/2016 05:56     Foot Imaging: Foot-R DG Complete:  Results for orders placed in visit on 01/22/14  DG Foot Complete Right   Narrative 3 views of a skeletally mature individual were obtained of the right foot.  Study includes DP, oblique, lateral projections.  Increase in soft tissue density and volume over the hallux. No significant  periosteal reaction to suggest osteomyelitis. Decrease in calcaneal  inclination angle with an increase in talar declination angle consistent  with flatfoot deformity. There is a plantar calcaneal heel spur and  retrocalcaneal exostosis present.   Foot-L DG Complete:  Results for orders placed in visit on 01/22/14  DG Foot Complete Left   Narrative 3 views of a skeletally mature individual were obtained of the left foot.  Study includes DP, oblique, lateral projections.  Decrease in calcaneal inclination angle with an increase in talar  declination angle consistent with flatfoot deformity. Retrocalcaneal  exostosis and plantar heel spur present. No acute fracture.     Complexity Note: Imaging results reviewed. Results shared with Ms. Gruen, using Layman's terms.                         Meds   Current Outpatient Medications:  .  B-D INSULIN SYRINGE 1CC/25G 25G X 5/8" 1 ML MISC, 1 UNITS BY MISCELLANEOUS ROUTE  EVERY SEVEN (7) DAYS. TO BE USED WITH METHOTREXATE, Disp: , Rfl: 3 .  busPIRone (BUSPAR) 10 MG tablet, TAKE 1 TABLET BY MOUTH  TWICE A DAY, Disp: 180 tablet, Rfl: 2 .  cephALEXin (KEFLEX) 500 MG capsule, Take 1 capsule (500 mg total) by mouth 2 (two) times daily., Disp: 14 capsule, Rfl: 0 .  Cholecalciferol (VITAMIN D-1000 MAX ST) 1000 units tablet, Take by mouth., Disp: , Rfl:  .  clotrimazole-betamethasone (LOTRISONE) cream, Apply 1 application topically 2 (two) times daily. For yeast infection under stomach, Disp: , Rfl:  .  famotidine (PEPCID) 20 MG tablet, Take 20 mg by mouth daily. , Disp: , Rfl:  .  FLUoxetine (PROZAC) 20 MG capsule, Take 1 capsule (20 mg total) by mouth daily., Disp: 60 capsule, Rfl: 5 .  folic acid (FOLVITE) 1 MG tablet, Take 1 mg by mouth daily. , Disp: , Rfl:  .  hydroxychloroquine (PLAQUENIL) 200 MG tablet, Take 200 mg by mouth 2 (two) times daily., Disp: , Rfl:  .  hydrOXYzine (ATARAX/VISTARIL) 50 MG tablet, Take 50 mg by mouth Twice daily. , Disp: , Rfl:  .  Insulin Aspart (NOVOLOG [place]), Inject 10 Units into the skin 2 (two) times daily. Injection twice daily, Disp: , Rfl:  .  insulin aspart protamine - aspart (NOVOLOG MIX 70/30 FLEXPEN) (70-30) 100 UNIT/ML FlexPen, Inject into the skin., Disp: , Rfl:  .  KLOR-CON M20 20 MEQ tablet, TAKE ONE TABLET BY MOUTH TWICE DAILY (Patient taking differently: take 2 tabs in the a.m. , 1 tab in the evening), Disp: 60 tablet, Rfl: 0 .  leflunomide (ARAVA) 10 MG tablet, Take 10 mg by mouth daily., Disp: , Rfl: 2 .  levothyroxine (SYNTHROID, LEVOTHROID) 88 MCG tablet, Take 88 mcg by mouth daily before breakfast., Disp: , Rfl:  .  liraglutide (VICTOZA) 18 MG/3ML SOPN, Inject into the skin., Disp: , Rfl:  .  lisinopril (PRINIVIL,ZESTRIL) 2.5 MG tablet, Take 2.5 mg by mouth daily., Disp: , Rfl:  .  loperamide (IMODIUM) 2 MG capsule, Take 2 mg by mouth as needed. , Disp: , Rfl: 0 .  lovastatin (MEVACOR) 40 MG tablet, Take 40 mg by mouth  daily. , Disp: , Rfl:  .  metFORMIN (GLUCOPHAGE) 500 MG tablet, Take 500 mg by mouth 2 (two) times daily with a meal. , Disp: , Rfl:  .  methotrexate 50 MG/2ML injection, Inject 1 mL into the muscle once a week., Disp: , Rfl:  .  [START ON 11/13/2017] oxyCODONE (OXY IR/ROXICODONE) 5 MG immediate release tablet, Take 1 tablet (5 mg total) by mouth every 6 (six) hours as needed for severe pain., Disp: 120 tablet, Rfl: 0 .  predniSONE (DELTASONE) 10 MG tablet, Take 40 mg by mouth daily as needed (Pain scale  Used to determine dosage). Reported on 08/29/2015, Disp: , Rfl:  .  pregabalin (LYRICA) 150 MG capsule, Take 1 capsule (150 mg total) by mouth 3 (three) times daily., Disp: 270 capsule, Rfl: 0 .  promethazine (PHENERGAN) 25 MG tablet, Take 1 tablet (25 mg total) by mouth every 6 (six) hours as needed for nausea or vomiting., Disp: 15 tablet, Rfl: 2 .  QUEtiapine (SEROQUEL) 300 MG tablet, TAKE 1 TABLET BY MOUTH EVERYDAY AT BEDTIME, Disp: 90 tablet, Rfl: 2 .  Tocilizumab (ACTEMRA IV), Inject into the vein., Disp: , Rfl:  .  zolpidem (AMBIEN) 5 MG tablet, Take 1 tablet (5 mg total) by mouth at bedtime., Disp: 30 tablet, Rfl: 5 .  ALPRAZolam (XANAX) 1 MG tablet, TAKE 1 TABLET (1 MG TOTAL) BY MOUTH TAKE AS DIRECTED. FOR 1 DOSE 30 MINUTES PRIOR TO PROCEDURE., Disp: , Rfl:  0 .  methotrexate 50 MG/2ML injection, Inject into the muscle., Disp: , Rfl:  .  [START ON 10/14/2017] oxyCODONE (OXY IR/ROXICODONE) 5 MG immediate release tablet, TAKE 1 TABLET BY MOUTH EVERY 6 HOURS AS NEEDED SEVERE PAIN, Disp: 120 tablet, Rfl: 0 .  oxyCODONE (OXY IR/ROXICODONE) 5 MG immediate release tablet, Take 1 tablet (5 mg total) by mouth every 6 (six) hours as needed for severe pain., Disp: 120 tablet, Rfl: 0 .  oxyCODONE-acetaminophen (PERCOCET/ROXICET) 5-325 MG tablet, Take by mouth., Disp: , Rfl:  .  phenol (CHLORASEPTIC) 1.4 % LIQD, Use as directed 1 spray in the mouth or throat every 2 (two) hours as needed for throat  irritation / pain. (Patient not taking: Reported on 09/14/2017), Disp: 177 mL, Rfl: 0 No current facility-administered medications for this visit.   Facility-Administered Medications Ordered in Other Visits:  .  lactated ringers infusion 1,000 mL, 1,000 mL, Intravenous, Continuous, Brylan Dec, Diona Foley, NP, Last Rate: 10 mL/hr at 02/14/17 1020, 1,000 mL at 02/14/17 1020  ROS  Constitutional: Denies any fever or chills Gastrointestinal: No reported hemesis, hematochezia, vomiting, or acute GI distress Musculoskeletal: Denies any acute onset joint swelling, redness, loss of ROM, or weakness Neurological: No reported episodes of acute onset apraxia, aphasia, dysarthria, agnosia, amnesia, paralysis, loss of coordination, or loss of consciousness  Allergies  Ms. Willard is allergic to codeine; propoxyphene; sulfa antibiotics; and hydrocodone.  PFSH  Drug: Ms. Rohrig  reports that she does not use drugs. Alcohol:  reports that she does not drink alcohol. Tobacco:  reports that she quit smoking about 18 years ago. Her smoking use included cigarettes. She has a 54.00 pack-year smoking history. She has never used smokeless tobacco. Medical:  has a past medical history of Abdominal wall hernia (01/29/2013), Anxiety, Arthritis, C. difficile colitis, Chronic diastolic heart failure ([place]), Depression, Diabetes mellitus, Diastolic CHF ([place]), Esophagitis, Fluid retention, GERD (gastroesophageal reflux disease), Hiatal hernia, Hypertension, Hypokalemia due to loss of potassium (10/21/2015), Hypothyroidism, IBS (irritable bowel syndrome), Moderate episode of recurrent major depressive disorder ([place]) (06/03/2004), Morbid obesity ([place]), Neurogenic bladder, Neuropathy, Obesity, Panic attacks, Rheumatoid arthritis ([place]), and Sleep apnea. Surgical: Ms. Mccaffery  has a past surgical history that includes Tubal ligation; Tonsillectomy; Cholecystectomy; Abdominal hysterectomy; Laparoscopic gastric banding (03/20/07); Eye surgery; and  Hernia repair. Family: family history includes Alcohol abuse in her father and sister; Anxiety disorder in her father, sister, and sister; Bipolar disorder in her father and sister; Depression in her father, sister, and sister; Drug abuse in her sister; Heart attack in her brother; Heart attack (age of onset: 21) in her brother; Heart disease in her brother; Heart failure in her father.  Constitutional Exam  General appearance: Well nourished, well developed, and well hydrated. In no apparent acute distress Vitals:   09/14/17 0956  BP: 131/87  Pulse: 79  Resp: 16  Temp: 98.7 F (37.1 C)  TempSrc: Oral  SpO2: 95%  Weight: (!) 332 lb (150.6 kg)  Height: 5' 5.5" (1.664 m)  Psych/Mental status: Alert, oriented x 3 (person, place, & time)       Eyes: PERLA Respiratory: No evidence of acute respiratory distress  Cervical Spine Area Exam  Skin & Axial Inspection: No masses, redness, edema, swelling, or associated skin lesions Alignment: Symmetrical Functional ROM: Unrestricted ROM      Stability: No instability detected Muscle Tone/Strength: Functionally intact. No obvious neuro-muscular anomalies detected. Sensory (Neurological): Unimpaired Palpation: No palpable anomalies  Upper Extremity (UE) Exam    Side: Right upper extremity  Side: Left upper extremity  Skin & Extremity Inspection: Skin color, temperature, and hair growth are WNL. No peripheral edema or cyanosis. No masses, redness, swelling, asymmetry, or associated skin lesions. No contractures.  Skin & Extremity Inspection: Edema  Functional ROM: Unrestricted ROM          Functional ROM: Decreased ROM for wrist  Muscle Tone/Strength: Functionally intact. No obvious neuro-muscular anomalies detected.  Muscle Tone/Strength: Functionally intact. No obvious neuro-muscular anomalies detected.  Sensory (Neurological): Unimpaired          Sensory (Neurological): Unimpaired          Palpation: No palpable anomalies               Palpation: No palpable anomalies              Provocative Test(s):  Phalen's test: deferred Tinel's test: deferred Apley's scratch test (touch opposite shoulder):  Action 1 (Across chest): deferred Action 2 (Overhead): deferred Action 3 (LB reach): deferred   Provocative Test(s):  Phalen's test: deferred Tinel's test: deferred Apley's scratch test (touch opposite shoulder):  Action 1 (Across chest): deferred Action 2 (Overhead): deferred Action 3 (LB reach): deferred    Gait & Posture Assessment  Ambulation: Unassisted Gait: Relatively normal for age and body habitus Posture: WNL   Assessment  Primary Diagnosis & Pertinent Problem List: The primary encounter diagnosis was Chronic wrist pain (Location of Secondary source of pain) (Bilateral) (L>R). Diagnoses of Rheumatoid arthritis involving multiple sites with positive rheumatoid factor (HCC), Chronic foot pain (Location of Primary Source of Pain) (Bilateral) (L>R), Chronic pain syndrome, Long term prescription opiate use, Neurogenic pain, Diabetic peripheral neuropathy ([place]), and Neuropathic pain were also pertinent to this visit.  Status Diagnosis  Worsening Controlled Controlled 1. Chronic wrist pain (Location of Secondary source of pain) (Bilateral) (L>R)   2. Rheumatoid arthritis involving multiple sites with positive rheumatoid factor (HCC)   3. Chronic foot pain (Location of Primary Source of Pain) (Bilateral) (L>R)   4. Chronic pain syndrome   5. Long term prescription opiate use   6. Neurogenic pain   7. Diabetic peripheral neuropathy ([place])   8. Neuropathic pain     Problems updated and reviewed during this visit: Problem  Chronic Pain Syndrome  Morbid (Severe) Obesity Due to Excess Calories (Hcc)   Overview:  per last BMI in vitals on 03/23/2017   Diabetes (Hcc)  Copd (Chronic Obstructive Pulmonary Disease) (Hcc)  Bipolar Disorder, Unspecified (Hcc)  Chronic Cystitis  Acute On Chronic Congestive Heart  Failure (Hcc)   Overview:  Medication Enbrel brought on the initial CHF    Plan of Care  Pharmacotherapy (Medications Ordered): Meds ordered this encounter  Medications  . oxyCODONE (OXY IR/ROXICODONE) 5 MG immediate release tablet    Sig: TAKE 1 TABLET BY MOUTH EVERY 6 HOURS AS NEEDED SEVERE PAIN    Dispense:  120 tablet    Refill:  0    Do not place this medication, or any other prescription from our practice, on "Automatic Refill". Patient may have prescription filled one day early if pharmacy is closed on scheduled refill date. Do not fill until:10/14/2017 To last until:11/13/2017    Order Specific Question:   Supervising Provider    Answer:   Milinda Pointer [phone]  . oxyCODONE (OXY IR/ROXICODONE) 5 MG immediate release tablet    Sig: Take 1 tablet (5 mg total) by mouth every 6 (six) hours as needed  for severe pain.    Dispense:  120 tablet    Refill:  0    Do not place this medication, or any other prescription from our practice, on "Automatic Refill". Patient may have prescription filled one day early if pharmacy is closed on scheduled refill date. Do not fill until: 09/14/2017 To last until:10/14/2017    Order Specific Question:   Supervising Provider    Answer:   Milinda Pointer [phone]  . pregabalin (LYRICA) 150 MG capsule    Sig: Take 1 capsule (150 mg total) by mouth 3 (three) times daily.    Dispense:  270 capsule    Refill:  0    Do not add this medication to the electronic "Automatic Refill" notification system. Patient may have prescription filled one day early if pharmacy is closed on scheduled refill date.    Order Specific Question:   Supervising Provider    Answer:   Milinda Pointer [phone]  . oxyCODONE (OXY IR/ROXICODONE) 5 MG immediate release tablet    Sig: Take 1 tablet (5 mg total) by mouth every 6 (six) hours as needed for severe pain.    Dispense:  120 tablet    Refill:  0    Do not place this medication, or any other prescription from our  practice, on "Automatic Refill". Patient may have prescription filled one day early if pharmacy is closed on scheduled refill date. Do not fill until: 11/13/2017 To last until:12/13/2017    Order Specific Question:   Supervising Provider    Answer:   Milinda Pointer [[id number]]   New Prescriptions   No medications on file   Medications administered today: [name] had no medications administered during this visit. Lab-work, procedure(s), and/or referral(s): Orders Placed This Encounter  Procedures  . ToxASSURE Select 13 (MW), Urine   Imaging and/or referral(s): None   Interventional therapies: Planned, scheduled, and/or pending:  Not at this time.   Considering:  IV lidocaine infusions.  Diagnostic bilateral lumbar sympathetic block  Intra-articular injection with local anesthetic and steroids.  Diagnostic bilateral intra-articular shoulder joint injection  Diagnostic bilateral intra-articular knee injections with local anesthetic and steroid  Diagnostic right-sided cervical epidural steroid injections  Diagnostic bilateral Cervicalfacet block  Possible bilateral cervical facet radiofrequency ablation.    Palliative PRN treatment(s):  Palliative IV lidocaine infusion   Provider-requested follow-up: Return in about 3 months (around 12/15/2017) for MedMgmt with Me Donella Stade Edison Pace).  Future Appointments  Date Time Provider [place]  12/15/2017  9:45 AM Vevelyn Francois, NP [facility] None  02/23/2018  1:20 PM Clapacs, Madie Reno, MD [facility] None   Primary Care Physician: Care, [place] [facility] Location: [facility] Management Facility Note by: Vevelyn Francois NP Date: 09/14/2017; Time: 11:27 AM  Pain Score Disclaimer: We use the NRS-11 scale. This is a self-reported, subjective measurement of pain severity with only modest accuracy. It is used primarily to identify changes within a particular patient. It must be understood that outpatient pain  scales are significantly less accurate that those used for research, where they can be applied under ideal controlled circumstances with minimal exposure to variables. In reality, the score is likely to be a combination of pain intensity and pain affect, where pain affect describes the degree of emotional arousal or changes in action readiness caused by the sensory experience of pain. Factors such as social and work situation, setting, emotional state, anxiety levels, expectation, and prior pain experience may influence pain perception and show large inter-individual differences  that may also be affected by time variables.  Patient instructions provided during this appointment: Patient Instructions   ____________________________________________________________________________________________  Medication Rules  Applies to: All patients receiving prescriptions (written or electronic).  Pharmacy of record: Pharmacy where electronic prescriptions will be sent. If written prescriptions are taken to a different pharmacy, please inform the nursing staff. The pharmacy listed in the electronic medical record should be the one where you would like electronic prescriptions to be sent.  Prescription refills: Only during scheduled appointments. Applies to both, written and electronic prescriptions.  NOTE: The following applies primarily to controlled substances (Opioid* Pain Medications).   Patient's responsibilities: 1. Pain Pills: Bring all pain pills to every appointment (except for procedure appointments). 2. Pill Bottles: Bring pills in original pharmacy bottle. Always bring newest bottle. Bring bottle, even if empty. 3. Medication refills: You are responsible for knowing and keeping track of what medications you need refilled. The day before your appointment, write a list of all prescriptions that need to be refilled. Bring that list to your appointment and give it to the admitting nurse. Prescriptions  will be written only during appointments. If you forget a medication, it will not be "Called in", "Faxed", or "electronically sent". You will need to get another appointment to get these prescribed. 4. Prescription Accuracy: You are responsible for carefully inspecting your prescriptions before leaving our office. Have the discharge nurse carefully go over each prescription with you, before taking them home. Make sure that your name is accurately spelled, that your address is correct. Check the name and dose of your medication to make sure it is accurate. Check the number of pills, and the written instructions to make sure they are clear and accurate. Make sure that you are given enough medication to last until your next medication refill appointment. 5. Taking Medication: Take medication as prescribed. Never take more pills than instructed. Never take medication more frequently than prescribed. Taking less pills or less frequently is permitted and encouraged, when it comes to controlled substances (written prescriptions).  6. Inform other Doctors: Always inform, all of your healthcare providers, of all the medications you take. 7. Pain Medication from other Providers: You are not allowed to accept any additional pain medication from any other Doctor or Healthcare provider. There are two exceptions to this rule. (see below) In the event that you require additional pain medication, you are responsible for notifying us, as stated below. 8. Medication Agreement: You are responsible for carefully reading and following our Medication Agreement. This must be signed before receiving any prescriptions from our practice. Safely store a copy of your signed Agreement. Violations to the Agreement will result in no further prescriptions. (Additional copies of our Medication Agreement are available upon request.) 9. Laws, Rules, & Regulations: All patients are expected to follow all Federal and [employer], Rules,  [facility]. Ignorance of the Laws does not constitute a valid excuse. The use of any illegal substances is prohibited. 10. Adopted CDC guidelines & recommendations: Target dosing levels will be at or below 60 MME/day. Use of benzodiazepines** is not recommended.  Exceptions: There are only two exceptions to the rule of not receiving pain medications from other Healthcare Providers. 1. Exception #1 (Emergencies): In the event of an emergency (i.e.: accident requiring emergency care), you are allowed to receive additional pain medication. However, you are responsible for: As soon as you are able, call our office [phone], at any time of the day or night, and leave a  message stating your name, the date and nature of the emergency, and the name and dose of the medication prescribed. In the event that your call is answered by a member of our staff, make sure to document and save the date, time, and the name of the person that took your information.  2. Exception #2 (Planned Surgery): In the event that you are scheduled by another doctor or dentist to have any type of surgery or procedure, you are allowed (for a period no longer than 30 days), to receive additional pain medication, for the acute post-op pain. However, in this case, you are responsible for picking up a copy of our "Post-op Pain Management for Surgeons" handout, and giving it to your surgeon or dentist. This document is available at our office, and does not require an appointment to obtain it. Simply go to our office during business hours (Monday-Thursday from 8:00 AM to 4:00 PM) (Friday 8:00 AM to 12:00 Noon) or if you have a scheduled appointment with [name], prior to your surgery, and ask for it by name. In addition, you will need to provide [name] with your name, name of your surgeon, type of surgery, and date of procedure or surgery.  *Opioid medications include: morphine, codeine, oxycodone, oxymorphone, hydrocodone, hydromorphone, meperidine,  tramadol, tapentadol, buprenorphine, fentanyl, methadone. **Benzodiazepine medications include: diazepam (Valium), alprazolam (Xanax), clonazepam (Klonopine), lorazepam (Ativan), clorazepate (Tranxene), chlordiazepoxide (Librium), estazolam (Prosom), oxazepam (Serax), temazepam (Restoril), triazolam (Halcion) (Last updated: 06/16/2017) ____________________________________________________________________________________________   ____________________________________________________________________________________________  Appointment Policy Summary  It is our goal and responsibility to provide the medical community with assistance in the evaluation and management of patients with chronic pain. Unfortunately our resources are limited. Because we do not have an unlimited amount of time, or available appointments, we are required to closely monitor and manage their use. The following rules exist to maximize their use:  Patient's responsibilities: 1. Punctuality:  At what time should I arrive? You should be physically present in our office 30 minutes before your scheduled appointment. Your scheduled appointment is with your assigned healthcare provider. However, it takes 5-10 minutes to be "checked-in", and another 15 minutes for the nurses to do the admission. If you arrive to our office at the time you were given for your appointment, you will end up being at least 20-25 minutes late to your appointment with the provider. 2. Tardiness:  What happens if I arrive only a few minutes after my scheduled appointment time? You will need to reschedule your appointment. The cutoff is your appointment time. This is why it is so important that you arrive at least 30 minutes before that appointment. If you have an appointment scheduled for 10:00 AM and you arrive at 10:01, you will be required to reschedule your appointment.  3. Plan ahead:  Always assume that you will encounter traffic on your way in. Plan for  it. If you are dependent on a driver, make sure they understand these rules and the need to arrive early. 4. Other appointments and responsibilities:  Avoid scheduling any other appointments before or after your pain clinic appointments.  5. Be prepared:  Write down everything that you need to discuss with your healthcare provider and give this information to the admitting nurse. Write down the medications that you will need refilled. Bring your pills and bottles (even the empty ones), to all of your appointments, except for those where a procedure is scheduled. 6. No children or pets:  Find someone to take care of them. It  is not appropriate to bring them in. 7. Scheduling changes:  We request "advanced notification" of any changes or cancellations. 8. Advanced notification:  Defined as a time period of more than 24 hours prior to the originally scheduled appointment. This allows for the appointment to be offered to other patients. 9. Rescheduling:  When a visit is rescheduled, it will require the cancellation of the original appointment. For this reason they both fall within the category of "Cancellations".  10. Cancellations:  They require advanced notification. Any cancellation less than 24 hours before the  appointment will be recorded as a "No Show". 11. No Show:  Defined as an unkept appointment where the patient failed to notify or declare to the practice their intention or inability to keep the appointment.  Corrective process for repeat offenders:  1. Tardiness: Three (3) episodes of rescheduling due to late arrivals will be recorded as one (1) "No Show". 2. Cancellation or reschedule: Three (3) cancellations or rescheduling will be recorded as one (1) "No Show". 3. "No Shows": Three (3) "No Shows" within a 12 month period will result in discharge from the practice. ____________________________________________________________________________________________   BMI Assessment:  Estimated body mass index is 54.41 kg/m as calculated from the following:   Height as of this encounter: 5' 5.5" (1.664 m).   Weight as of this encounter: 332 lb (150.6 kg).  BMI interpretation table: BMI level Category Range association with higher incidence of chronic pain  <18 kg/m2 Underweight   18.5-24.9 kg/m2 Ideal body weight   25-29.9 kg/m2 Overweight Increased incidence by 20%  30-34.9 kg/m2 Obese (Class I) Increased incidence by 68%  35-39.9 kg/m2 Severe obesity (Class II) Increased incidence by 136%  >40 kg/m2 Extreme obesity (Class III) Increased incidence by 254%   Patient's current BMI Ideal Body weight  Body mass index is 54.41 kg/m. Ideal body weight: 58.2 kg (128 lb 3.2 oz) Adjusted ideal body weight: 95.1 kg (209 lb 11.5 oz)   BMI Readings from Last 4 Encounters:  09/14/17 54.41 kg/m  09/09/17 53.59 kg/m  07/05/17 55.08 kg/m  05/12/17 54.23 kg/m   Wt Readings from Last 4 Encounters:  09/14/17 (!) 332 lb (150.6 kg)  09/09/17 (!) 332 lb (150.6 kg)  07/05/17 (!) 331 lb (150.1 kg)  05/12/17 (!) 336 lb (152.4 kg)

## 2017-09-14 NOTE — Patient Instructions (Signed)
____________________________________________________________________________________________  Medication Rules  Applies to: All patients receiving prescriptions (written or electronic).  Pharmacy of record: Pharmacy where electronic prescriptions will be sent. If written prescriptions are taken to a different pharmacy, please inform the nursing staff. The pharmacy listed in the electronic medical record should be the one where you would like electronic prescriptions to be sent.  Prescription refills: Only during scheduled appointments. Applies to both, written and electronic prescriptions.  NOTE: The following applies primarily to controlled substances (Opioid* Pain Medications).   Patient's responsibilities: 1. Pain Pills: Bring all pain pills to every appointment (except for procedure appointments). 2. Pill Bottles: Bring pills in original pharmacy bottle. Always bring newest bottle. Bring bottle, even if empty. 3. Medication refills: You are responsible for knowing and keeping track of what medications you need refilled. The day before your appointment, write a list of all prescriptions that need to be refilled. Bring that list to your appointment and give it to the admitting nurse. Prescriptions will be written only during appointments. If you forget a medication, it will not be "Called in", "Faxed", or "electronically sent". You will need to get another appointment to get these prescribed. 4. Prescription Accuracy: You are responsible for carefully inspecting your prescriptions before leaving our office. Have the discharge nurse carefully go over each prescription with you, before taking them home. Make sure that your name is accurately spelled, that your address is correct. Check the name and dose of your medication to make sure it is accurate. Check the number of pills, and the written instructions to make sure they are clear and accurate. Make sure that you are given enough medication to last  until your next medication refill appointment. 5. Taking Medication: Take medication as prescribed. Never take more pills than instructed. Never take medication more frequently than prescribed. Taking less pills or less frequently is permitted and encouraged, when it comes to controlled substances (written prescriptions).  6. Inform other Doctors: Always inform, all of your healthcare providers, of all the medications you take. 7. Pain Medication from other Providers: You are not allowed to accept any additional pain medication from any other Doctor or Healthcare provider. There are two exceptions to this rule. (see below) In the event that you require additional pain medication, you are responsible for notifying us, as stated below. 8. Medication Agreement: You are responsible for carefully reading and following our Medication Agreement. This must be signed before receiving any prescriptions from our practice. Safely store a copy of your signed Agreement. Violations to the Agreement will result in no further prescriptions. (Additional copies of our Medication Agreement are available upon request.) 9. Laws, Rules, & Regulations: All patients are expected to follow all Federal and State Laws, Statutes, Rules, & Regulations. Ignorance of the Laws does not constitute a valid excuse. The use of any illegal substances is prohibited. 10. Adopted CDC guidelines & recommendations: Target dosing levels will be at or below 60 MME/day. Use of benzodiazepines** is not recommended.  Exceptions: There are only two exceptions to the rule of not receiving pain medications from other Healthcare Providers. 1. Exception #1 (Emergencies): In the event of an emergency (i.e.: accident requiring emergency care), you are allowed to receive additional pain medication. However, you are responsible for: As soon as you are able, call our office [phone], at any time of the day or night, and leave a message stating your name, the  date and nature of the emergency, and the name and dose of the medication   prescribed. In the event that your call is answered by a member of our staff, make sure to document and save the date, time, and the name of the person that took your information.  2. Exception #2 (Planned Surgery): In the event that you are scheduled by another doctor or dentist to have any type of surgery or procedure, you are allowed (for a period no longer than 30 days), to receive additional pain medication, for the acute post-op pain. However, in this case, you are responsible for picking up a copy of our "Post-op Pain Management for Surgeons" handout, and giving it to your surgeon or dentist. This document is available at our office, and does not require an appointment to obtain it. Simply go to our office during business hours (Monday-Thursday from 8:00 AM to 4:00 PM) (Friday 8:00 AM to 12:00 Noon) or if you have a scheduled appointment with us, prior to your surgery, and ask for it by name. In addition, you will need to provide us with your name, name of your surgeon, type of surgery, and date of procedure or surgery.  *Opioid medications include: morphine, codeine, oxycodone, oxymorphone, hydrocodone, hydromorphone, meperidine, tramadol, tapentadol, buprenorphine, fentanyl, methadone. **Benzodiazepine medications include: diazepam (Valium), alprazolam (Xanax), clonazepam (Klonopine), lorazepam (Ativan), clorazepate (Tranxene), chlordiazepoxide (Librium), estazolam (Prosom), oxazepam (Serax), temazepam (Restoril), triazolam (Halcion) (Last updated: 06/16/2017) ____________________________________________________________________________________________   ____________________________________________________________________________________________  Appointment Policy Summary  It is our goal and responsibility to provide the medical community with assistance in the evaluation and management of patients with chronic pain.  Unfortunately our resources are limited. Because we do not have an unlimited amount of time, or available appointments, we are required to closely monitor and manage their use. The following rules exist to maximize their use:  Patient's responsibilities: 1. Punctuality:  At what time should I arrive? You should be physically present in our office 30 minutes before your scheduled appointment. Your scheduled appointment is with your assigned healthcare provider. However, it takes 5-10 minutes to be "checked-in", and another 15 minutes for the nurses to do the admission. If you arrive to our office at the time you were given for your appointment, you will end up being at least 20-25 minutes late to your appointment with the provider. 2. Tardiness:  What happens if I arrive only a few minutes after my scheduled appointment time? You will need to reschedule your appointment. The cutoff is your appointment time. This is why it is so important that you arrive at least 30 minutes before that appointment. If you have an appointment scheduled for 10:00 AM and you arrive at 10:01, you will be required to reschedule your appointment.  3. Plan ahead:  Always assume that you will encounter traffic on your way in. Plan for it. If you are dependent on a driver, make sure they understand these rules and the need to arrive early. 4. Other appointments and responsibilities:  Avoid scheduling any other appointments before or after your pain clinic appointments.  5. Be prepared:  Write down everything that you need to discuss with your healthcare provider and give this information to the admitting nurse. Write down the medications that you will need refilled. Bring your pills and bottles (even the empty ones), to all of your appointments, except for those where a procedure is scheduled. 6. No children or pets:  Find someone to take care of them. It is not appropriate to bring them in. 7. Scheduling changes:  We request  "advanced notification" of any changes or   cancellations. 8. Advanced notification:  Defined as a time period of more than 24 hours prior to the originally scheduled appointment. This allows for the appointment to be offered to other patients. 9. Rescheduling:  When a visit is rescheduled, it will require the cancellation of the original appointment. For this reason they both fall within the category of "Cancellations".  10. Cancellations:  They require advanced notification. Any cancellation less than 24 hours before the  appointment will be recorded as a "No Show". 11. No Show:  Defined as an unkept appointment where the patient failed to notify or declare to the practice their intention or inability to keep the appointment.  Corrective process for repeat offenders:  1. Tardiness: Three (3) episodes of rescheduling due to late arrivals will be recorded as one (1) "No Show". 2. Cancellation or reschedule: Three (3) cancellations or rescheduling will be recorded as one (1) "No Show". 3. "No Shows": Three (3) "No Shows" within a 12 month period will result in discharge from the practice. ____________________________________________________________________________________________   BMI Assessment: Estimated body mass index is 54.41 kg/m as calculated from the following:   Height as of this encounter: 5' 5.5" (1.664 m).   Weight as of this encounter: 332 lb (150.6 kg).  BMI interpretation table: BMI level Category Range association with higher incidence of chronic pain  <18 kg/m2 Underweight   18.5-24.9 kg/m2 Ideal body weight   25-29.9 kg/m2 Overweight Increased incidence by 20%  30-34.9 kg/m2 Obese (Class I) Increased incidence by 68%  35-39.9 kg/m2 Severe obesity (Class II) Increased incidence by 136%  >40 kg/m2 Extreme obesity (Class III) Increased incidence by 254%   Patient's current BMI Ideal Body weight  Body mass index is 54.41 kg/m. Ideal body weight: 58.2 kg (128 lb 3.2  oz) Adjusted ideal body weight: 95.1 kg (209 lb 11.5 oz)   BMI Readings from Last 4 Encounters:  09/14/17 54.41 kg/m  09/09/17 53.59 kg/m  07/05/17 55.08 kg/m  05/12/17 54.23 kg/m   Wt Readings from Last 4 Encounters:  09/14/17 (!) 332 lb (150.6 kg)  09/09/17 (!) 332 lb (150.6 kg)  07/05/17 (!) 331 lb (150.1 kg)  05/12/17 (!) 336 lb (152.4 kg)

## 2017-09-19 LAB — TOXASSURE SELECT 13 (MW), URINE

## 2017-09-21 ENCOUNTER — Ambulatory Visit: Admit: 2017-09-21 | Discharge: 2017-09-22 | Payer: MEDICAID | Attending: Family | Primary: Family

## 2017-09-21 DIAGNOSIS — I5032 Chronic diastolic (congestive) heart failure: Secondary | ICD-10-CM

## 2017-09-21 DIAGNOSIS — E1165 Type 2 diabetes mellitus with hyperglycemia: Principal | ICD-10-CM

## 2017-09-21 DIAGNOSIS — Z1239 Encounter for other screening for malignant neoplasm of breast: Secondary | ICD-10-CM

## 2017-09-21 DIAGNOSIS — J449 Chronic obstructive pulmonary disease, unspecified: Secondary | ICD-10-CM

## 2017-09-21 DIAGNOSIS — R300 Dysuria: Secondary | ICD-10-CM

## 2017-09-21 MED ORDER — LISINOPRIL 2.5 MG TABLET
ORAL_TABLET | Freq: Every day | ORAL | 0 refills | 0 days | Status: CP
Start: 2017-09-21 — End: 2018-07-18

## 2017-09-21 MED ORDER — LEVOTHYROXINE 88 MCG TABLET
ORAL_TABLET | Freq: Every day | ORAL | 0 refills | 0.00000 days | Status: CP
Start: 2017-09-21 — End: 2018-01-06

## 2017-09-21 MED ORDER — INSULIN SYRINGES (DISPOSABLE) 1 ML
Freq: Every day | 3 refills | 0.00000 days | Status: CP
Start: 2017-09-21 — End: ?

## 2017-09-21 MED ORDER — METFORMIN 500 MG TABLET
ORAL_TABLET | Freq: Two times a day (BID) | ORAL | 3 refills | 0 days | Status: CP
Start: 2017-09-21 — End: 2018-10-04

## 2017-09-26 MED ORDER — SYRINGE WITH NEEDLE 1 ML 25 GAUGE X 5/8"
INJECTION | 0 refills | 0.00000 days | Status: CP
Start: 2017-09-26 — End: 2018-06-13

## 2017-10-02 ENCOUNTER — Ambulatory Visit: Admit: 2017-10-02 | Discharge: 2017-10-06 | Disposition: A | Discharge status: Home Health | Payer: MEDICAID

## 2017-10-02 DIAGNOSIS — R739 Hyperglycemia, unspecified: Principal | ICD-10-CM

## 2017-10-04 DIAGNOSIS — R739 Hyperglycemia, unspecified: Principal | ICD-10-CM

## 2017-10-06 MED ORDER — ACETAMINOPHEN 500 MG TABLET
ORAL_TABLET | Freq: Three times a day (TID) | ORAL | 0 refills | 0.00000 days | Status: SS
Start: 2017-10-06 — End: 2017-11-16

## 2017-10-06 MED ORDER — LANCETS
Freq: Four times a day (QID) | 2 refills | 0.00000 days | Status: SS
Start: 2017-10-06 — End: 2017-11-16

## 2017-10-06 MED ORDER — CHLORHEXIDINE GLUCONATE 4 % TOPICAL LIQUID
Freq: Two times a day (BID) | TOPICAL | 0 refills | 0.00000 days | Status: SS
Start: 2017-10-06 — End: 2017-11-16

## 2017-10-06 MED ORDER — INSULIN GLARGINE (U-100) 100 UNIT/ML SUBCUTANEOUS SOLUTION
Freq: Every evening | SUBCUTANEOUS | 0 refills | 0.00000 days
Start: 2017-10-06 — End: 2017-10-31

## 2017-10-06 MED ORDER — INSULIN LISPRO (U-100) 100 UNIT/ML SUBCUTANEOUS SOLUTION
Freq: Three times a day (TID) | SUBCUTANEOUS | 2 refills | 0.00000 days | Status: CP
Start: 2017-10-06 — End: 2017-10-31

## 2017-10-06 MED ORDER — SODIUM CHLORIDE 0.9 % SOLUTION
Freq: Two times a day (BID) | 0 refills | 0 days | Status: SS
Start: 2017-10-06 — End: 2017-11-16

## 2017-10-06 MED ORDER — AMOXICILLIN 875 MG-POTASSIUM CLAVULANATE 125 MG TABLET
ORAL_TABLET | Freq: Two times a day (BID) | ORAL | 0 refills | 0.00000 days | Status: CP
Start: 2017-10-06 — End: 2017-10-31

## 2017-10-06 MED ORDER — INSULIN SYRINGE NEEDLELESS 1 ML
INJECTION | Freq: Four times a day (QID) | 0 refills | 0.00000 days | Status: SS
Start: 2017-10-06 — End: 2017-11-16

## 2017-10-06 MED ORDER — BLOOD SUGAR DIAGNOSTIC STRIPS
Freq: Four times a day (QID) | 2 refills | 0.00000 days | Status: CP
Start: 2017-10-06 — End: 2017-10-31

## 2017-10-06 MED ORDER — PEN NEEDLE, DIABETIC 31 GAUGE X 1/4"
Freq: Four times a day (QID) | 2 refills | 0.00000 days | Status: SS
Start: 2017-10-06 — End: 2017-11-16

## 2017-10-07 ENCOUNTER — Telehealth: Payer: Self-pay | Admitting: Nurse Practitioner

## 2017-10-07 NOTE — Telephone Encounter (Signed)
Was given #270 pills on 09-14-17, to take tid. Patient notified she should have enough to last 3 months.

## 2017-10-07 NOTE — Telephone Encounter (Signed)
Patient has been in the hospital. Needs to get Lyrica called in please.

## 2017-10-10 ENCOUNTER — Ambulatory Visit: Admit: 2017-10-10 | Discharge: 2017-10-11 | Payer: MEDICAID

## 2017-10-10 DIAGNOSIS — R928 Other abnormal and inconclusive findings on diagnostic imaging of breast: Principal | ICD-10-CM

## 2017-10-11 ENCOUNTER — Other Ambulatory Visit: Payer: Self-pay | Admitting: Psychiatry

## 2017-10-11 ENCOUNTER — Ambulatory Visit: Admit: 2017-10-11 | Discharge: 2017-10-12 | Payer: MEDICAID

## 2017-10-11 DIAGNOSIS — L03019 Cellulitis of unspecified finger: Principal | ICD-10-CM

## 2017-10-11 DIAGNOSIS — S52552A Other extraarticular fracture of lower end of left radius, initial encounter for closed fracture: Secondary | ICD-10-CM

## 2017-10-18 ENCOUNTER — Telehealth: Payer: Self-pay | Admitting: Nurse Practitioner

## 2017-10-18 ENCOUNTER — Telehealth: Payer: Self-pay | Admitting: *Deleted

## 2017-10-18 NOTE — Telephone Encounter (Signed)
Has been out of Lyrica 2 weeks waiting on PA. Please call patient and let her know status

## 2017-10-19 NOTE — Telephone Encounter (Signed)
Called patient to inform her that her PA was sent 10/18/17. Pt with understanding.

## 2017-10-31 ENCOUNTER — Ambulatory Visit: Payer: Medicaid Other | Admitting: Podiatry

## 2017-10-31 ENCOUNTER — Ambulatory Visit (INDEPENDENT_AMBULATORY_CARE_PROVIDER_SITE_OTHER): Payer: Medicaid Other

## 2017-10-31 ENCOUNTER — Encounter: Payer: Self-pay | Admitting: Podiatry

## 2017-10-31 ENCOUNTER — Ambulatory Visit: Admit: 2017-10-31 | Discharge: 2017-11-01 | Payer: MEDICAID | Attending: Family | Primary: Family

## 2017-10-31 DIAGNOSIS — Z6841 Body Mass Index (BMI) 40.0 and over, adult: Secondary | ICD-10-CM

## 2017-10-31 DIAGNOSIS — E1165 Type 2 diabetes mellitus with hyperglycemia: Principal | ICD-10-CM

## 2017-10-31 DIAGNOSIS — M2031 Hallux varus (acquired), right foot: Secondary | ICD-10-CM

## 2017-10-31 DIAGNOSIS — E1142 Type 2 diabetes mellitus with diabetic polyneuropathy: Secondary | ICD-10-CM

## 2017-10-31 DIAGNOSIS — L97519 Non-pressure chronic ulcer of other part of right foot with unspecified severity: Principal | ICD-10-CM

## 2017-10-31 DIAGNOSIS — E11621 Type 2 diabetes mellitus with foot ulcer: Secondary | ICD-10-CM

## 2017-10-31 MED ORDER — INSULIN GLARGINE (U-100) 100 UNIT/ML SUBCUTANEOUS SOLUTION
Freq: Every evening | SUBCUTANEOUS | 11 refills | 0 days | Status: SS
Start: 2017-10-31 — End: 2017-12-10

## 2017-10-31 MED ORDER — LANCETS
11 refills | 0 days | Status: SS
Start: 2017-10-31 — End: 2017-11-16

## 2017-10-31 MED ORDER — BLOOD SUGAR DIAGNOSTIC STRIPS
Freq: Four times a day (QID) | 11 refills | 0 days | Status: CP
Start: 2017-10-31 — End: 2017-11-08

## 2017-10-31 MED ORDER — INSULIN LISPRO (U-100) 100 UNIT/ML SUBCUTANEOUS SOLUTION
11 refills | 0 days | Status: SS
Start: 2017-10-31 — End: 2018-02-07

## 2017-10-31 NOTE — Progress Notes (Addendum)
This patient presents to the office with chief complaint of a ulcer under her right big toe.  She says she was in the hospital  and the ulcer appeared.  She says she experiences pain walking and wearing her shoes.  She actually says that her ulcer is improving from when it first developed.  This patient is a diabetic  whose last A1c was 12.2.  He is also a diabetic with neuropathy.  Patient also gives a history of rheumatoid arthritis. She was last seen in this office in 2016 by Dr. Ardelle Anton.  She presents the office today for continued evaluation and treatment of this diabetic ulcer right hallux.  She also states that she has developed a black dot on her right heel.  No pain, swelling or drainage is noted from the site.  General Appearance  Alert, conversant and in no acute stress.  Vascular  Dorsalis pedis and posterior tibial  pulses are palpable  bilaterally.  Capillary return is within normal limits  bilaterally. Temperature is within normal limits  bilaterally.  Neurologic  Senn-Weinstein monofilament wire test diminished  bilaterally. Muscle power within normal limits bilaterally.  Nails normal nails noted with no evidence of bacterial or fungal infection.  Orthopedic  No limitations of motion of motion feet .  No crepitus or effusions noted. Fixed dorsiflexion positioning at the IPJ right hallux  Skin  normotropic skin with no porokeratosis noted bilaterally.  There is a diabetic ulcer under the plantar aspect of the IPJ right hallux.   Surrounding the diabetic ulcer which measures 10 mm x 10 mm is necrotic tissue.  No evidence of any redness, swelling or infection..  Skin necrosis right heel.  Diabetic ulcer right hallux.  Diabetic neuropathy.  IE>  Debride ulcer right hallux.  The ulcer was bandaged with Silvadene and dry sterile dressing.  I considered antibiotics prophylactically, but decided against the antibiotics due to medication problem with this patient.  X-rays taken reveal no  evidence of any bony pathology.  Patient was told to ambulate with a surgical shoe.  Home soaking instructions were given to this patient.  Return to the clinic in 2 weeks for further evaluation and treatment.   Helane Gunther DPM

## 2017-11-01 ENCOUNTER — Encounter: Payer: Self-pay | Admitting: Obstetrics & Gynecology

## 2017-11-01 ENCOUNTER — Other Ambulatory Visit (HOSPITAL_COMMUNITY)
Admission: RE | Admit: 2017-11-01 | Discharge: 2017-11-01 | Disposition: A | Discharge status: Home / Self Care | Payer: Medicaid Other | Source: Ambulatory Visit | Attending: Obstetrics & Gynecology | Admitting: Obstetrics & Gynecology

## 2017-11-01 ENCOUNTER — Ambulatory Visit (INDEPENDENT_AMBULATORY_CARE_PROVIDER_SITE_OTHER): Payer: Medicaid Other | Admitting: Obstetrics & Gynecology

## 2017-11-01 VITALS — BP 130/84 | HR 85 | Ht 65.5 in | Wt 319.0 lb

## 2017-11-01 DIAGNOSIS — N763 Subacute and chronic vulvitis: Secondary | ICD-10-CM | POA: Insufficient documentation

## 2017-11-01 DIAGNOSIS — E11620 Type 2 diabetes mellitus with diabetic dermatitis: Secondary | ICD-10-CM | POA: Diagnosis not present

## 2017-11-01 DIAGNOSIS — B373 Candidiasis of vulva and vagina: Secondary | ICD-10-CM | POA: Insufficient documentation

## 2017-11-01 DIAGNOSIS — B9689 Other specified bacterial agents as the cause of diseases classified elsewhere: Secondary | ICD-10-CM | POA: Diagnosis not present

## 2017-11-01 DIAGNOSIS — N760 Acute vaginitis: Secondary | ICD-10-CM

## 2017-11-01 DIAGNOSIS — B3731 Acute candidiasis of vulva and vagina: Secondary | ICD-10-CM

## 2017-11-01 MED ORDER — FLUCONAZOLE 150 MG PO TABS: ORAL_TABLET | ORAL | 1 refills | Status: DC

## 2017-11-01 MED ORDER — METRONIDAZOLE 500 MG PO TABS: 500.0000 mg | ORAL_TABLET | Freq: Two times a day (BID) | ORAL | 0 refills | Status: DC

## 2017-11-01 MED ORDER — CLOTRIMAZOLE-BETAMETHASONE 1-0.05 % EX CREA: 1.0000 "application " | TOPICAL_CREAM | Freq: Two times a day (BID) | CUTANEOUS | 3 refills | Status: DC

## 2017-11-01 NOTE — Progress Notes (Signed)
HPI:      [name] is a 57 y.o.  presents today for a problem visit.  She complains of:  Vaginitis: Patient complains of an abnormal vulvar discomfort and vaginal discharge for 4 weeks. Vaginal symptoms include burning, discharge described as white, creamy and malodorous, local irritation, odor, pain and bleeding (from scratching she feels (prior hysterectomy)).Vulvar symptoms include local irritation, odor and pain.STI Risk: Very low risk of STD exposureDischarge described as: white, thick, mucoid and malodorous.Other associated symptoms: none.Menstrual pattern: She had been bleeding none as has had hyst. Contraception: status post hysterectomy.  Diabetes under poor control at this time (BS as high as 500)  PMHx: She  has a past medical history of Abdominal wall hernia (01/29/2013), Anxiety, Arthritis, C. difficile colitis, Chronic diastolic heart failure (HCC), Depression, Diabetes mellitus, Diastolic CHF (HCC), Esophagitis, Fluid retention, GERD (gastroesophageal reflux disease), Hiatal hernia, Hypertension, Hypokalemia due to loss of potassium (10/21/2015), Hypothyroidism, IBS (irritable bowel syndrome), Moderate episode of recurrent major depressive disorder (HCC) (06/03/2004), Morbid obesity (HCC), Neurogenic bladder, Neuropathy, Obesity, Panic attacks, Rheumatoid arthritis (HCC), and Sleep apnea. Also,  has a past surgical history that includes Tubal ligation; Tonsillectomy; Cholecystectomy; Abdominal hysterectomy; Laparoscopic gastric banding (03/20/07); Eye surgery; and Hernia repair., family history includes Alcohol abuse in her father and sister; Anxiety disorder in her father, sister, and sister; Bipolar disorder in her father and sister; Depression in her father, sister, and sister; Drug abuse in her sister; Heart attack in her brother; Heart attack (age of onset: 80) in her brother; Heart disease in her brother; Heart failure in her father.,  reports that she quit smoking about 18 years  ago. Her smoking use included cigarettes. She has a 54.00 pack-year smoking history. She has never used smokeless tobacco. She reports that she does not drink alcohol or use drugs.  She has a current medication list which includes the following prescription(s): aripiprazole, atorvastatin, b-d insulin syringe 1cc/25g, buspirone, calcium carb-ergocalciferol, cholecalciferol, clotrimazole-betamethasone, diclofenac sodium, fluoxetine, folic acid, hydroxychloroquine, hydroxyzine, insulin lispro, klor-con m20, lantus, leflunomide, levothyroxine, lisinopril, loperamide, lovastatin, melatonin, metformin, methotrexate, oxycodone-acetaminophen, prednisone, pregabalin, promethazine, quetiapine, vitamin d (ergocalciferol), zolpidem, clotrimazole-betamethasone, fluconazole, and metronidazole, and the following Facility-Administered Medications: lactated ringers. Also, is allergic to codeine; propoxyphene; sulfa antibiotics; and hydrocodone.  Review of Systems  Constitutional: Negative for chills, fever and malaise/fatigue.  HENT: Negative for congestion, sinus pain and sore throat.   Eyes: Negative for blurred vision and pain.  Respiratory: Negative for cough and wheezing.   Cardiovascular: Negative for chest pain and leg swelling.  Gastrointestinal: Negative for abdominal pain, constipation, diarrhea, heartburn, nausea and vomiting.  Genitourinary: Negative for dysuria, frequency, hematuria and urgency.  Musculoskeletal: Negative for back pain, joint pain, myalgias and neck pain.  Skin: Negative for itching and rash.  Neurological: Negative for dizziness, tremors and weakness.  Endo/Heme/Allergies: Does not bruise/bleed easily.  Psychiatric/Behavioral: Negative for depression. The patient is not nervous/anxious and does not have insomnia.     Objective: BP 130/84   Pulse 85   Ht 5' 5.5" (1.664 m)   Wt (!) 319 lb (144.7 kg)   LMP 04/20/2001   BMI 52.28 kg/m  Physical Exam  Constitutional: She is  oriented to person, place, and time. She appears well-developed and well-nourished. No distress.  Genitourinary: Vagina normal. Pelvic exam was performed with patient supine. There is no rash, tenderness or lesion on the right labia. There is no rash, tenderness or lesion on the left labia. No erythema or bleeding in the  vagina.  Genitourinary Comments: Extensive erythema, irritation, scaly skin and T to touch labia  HENT:  Head: Normocephalic and atraumatic.  Nose: Nose normal.  Mouth/Throat: Oropharynx is clear and moist.  Abdominal: Soft. She exhibits no distension. There is no tenderness.  Musculoskeletal: Normal range of motion.  Neurological: She is alert and oriented to person, place, and time. No cranial nerve deficit.  Skin: Skin is warm and dry.  Psychiatric: She has a normal mood and affect.   ASSESSMENT/PLAN:    Problem List Items Addressed This Visit      Endocrine   Diabetes (HCC)   Relevant Medications   insulin lispro (HUMALOG) 100 UNIT/ML injection     Other   Subacute vulvitis - Primary    Other Visit Diagnoses    BV (bacterial vaginosis)       Relevant Medications   clotrimazole-betamethasone (LOTRISONE) cream   fluconazole (DIFLUCAN) 150 MG tablet   metroNIDAZOLE (FLAGYL) 500 MG tablet   Candida vaginitis       Relevant Medications   clotrimazole-betamethasone (LOTRISONE) cream   fluconazole (DIFLUCAN) 150 MG tablet   metroNIDAZOLE (FLAGYL) 500 MG tablet    Also counseled to cont to try to get diabetes under control as it can lead to these outbreaks  Annamarie Major, MD, [facility], [facility] 11/01/2017  5:03 PM

## 2017-11-01 NOTE — Patient Instructions (Signed)
Fluconazole tablets °What is this medicine? °FLUCONAZOLE (floo KON na zole) is an antifungal medicine. It is used to treat certain kinds of fungal or yeast infections. °This medicine may be used for other purposes; ask your health care provider or pharmacist if you have questions. °COMMON BRAND NAME(S): Diflucan °What should I tell my health care provider before I take this medicine? °They need to know if you have any of these conditions: °-history of irregular heart beat °-kidney disease °-an unusual or allergic reaction to fluconazole, other azole antifungals, medicines, foods, dyes, or preservatives °-pregnant or trying to get pregnant °-breast-feeding °How should I use this medicine? °Take this medicine by mouth. Follow the directions on the prescription label. Do not take your medicine more often than directed. °Talk to your pediatrician regarding the use of this medicine in children. Special care may be needed. This medicine has been used in children as young as 6 months of age. °Overdosage: If you think you have taken too much of this medicine contact a poison control center or emergency room at once. °NOTE: This medicine is only for you. Do not share this medicine with others. °What if I miss a dose? °If you miss a dose, take it as soon as you can. If it is almost time for your next dose, take only that dose. Do not take double or extra doses. °What may interact with this medicine? °Do not take this medicine with any of the following medications: °-astemizole °-certain medicines for irregular heart beat like dofetilide, dronedarone, quinidine °-cisapride °-erythromycin °-lomitapide °-other medicines that prolong the QT interval (cause an abnormal heart rhythm) °-pimozide °-terfenadine °-thioridazine °-tolvaptan °-ziprasidone °This medicine may also interact with the following medications: °-antiviral medicines for HIV or AIDS °-birth control pills °-certain antibiotics like rifabutin, rifampin °-certain  medicines for blood pressure like amlodipine, isradipine, felodipine, hydrochlorothiazide, losartan, nifedipine °-certain medicines for cancer like cyclophosphamide, vinblastine, vincristine °-certain medicines for cholesterol like atorvastatin, lovastatin, fluvastatin, simvastatin °-certain medicines for depression, anxiety, or psychotic disturbances like amitriptyline, midazolam, nortriptyline, triazolam °-certain medicines for diabetes like glipizide, glyburide, tolbutamide °-certain medicines for pain like alfentanil, fentanyl, methadone °-certain medicines for seizures like carbamazepine, phenytoin °-certain medicines that treat or prevent blood clots like warfarin °-halofantrine °-medicines that lower your chance of fighting infection like cyclosporine, prednisone, tacrolimus °-NSAIDS, medicines for pain and inflammation, like celecoxib, diclofenac, flurbiprofen, ibuprofen, meloxicam, naproxen °-other medicines for fungal infections °-sirolimus °-theophylline °-tofacitinib °This list may not describe all possible interactions. Give your health care provider a list of all the medicines, herbs, non-prescription drugs, or dietary supplements you use. Also tell them if you smoke, drink alcohol, or use illegal drugs. Some items may interact with your medicine. °What should I watch for while using this medicine? °Visit your doctor or health care professional for regular checkups. If you are taking this medicine for a long time you may need blood work. Tell your doctor if your symptoms do not improve. Some fungal infections need many weeks or months of treatment to cure. °Alcohol can increase possible damage to your liver. Avoid alcoholic drinks. °If you have a vaginal infection, do not have sex until you have finished your treatment. You can wear a sanitary napkin. Do not use tampons. Wear freshly washed cotton, not synthetic, panties. °What side effects may I notice from receiving this medicine? °Side effects that  you should report to your doctor or health care professional as soon as possible: °-allergic reactions like skin rash or itching, hives, swelling of the   lips, mouth, tongue, or throat °-dark urine °-feeling dizzy or faint °-irregular heartbeat or chest pain °-redness, blistering, peeling or loosening of the skin, including inside the mouth °-trouble breathing °-unusual bruising or bleeding °-vomiting °-yellowing of the eyes or skin °Side effects that usually do not require medical attention (report to your doctor or health care professional if they continue or are bothersome): °-changes in how food tastes °-diarrhea °-headache °-stomach upset or nausea °This list may not describe all possible side effects. Call your doctor for medical advice about side effects. You may report side effects to FDA at 1-800-FDA-1088. °Where should I keep my medicine? °Keep out of the reach of children. °Store at room temperature below 30 degrees C (86 degrees F). Throw away any medicine after the expiration date. °NOTE: This sheet is a summary. It may not cover all possible information. If you have questions about this medicine, talk to your doctor, pharmacist, or health care provider. °© 2018 Elsevier/Gold Standard (2012-11-11 19:37:38) ° °

## 2017-11-03 LAB — CERVICOVAGINAL ANCILLARY ONLY
Bacterial vaginitis: NEGATIVE
Candida vaginitis: POSITIVE — AB

## 2017-11-08 MED ORDER — BLOOD SUGAR DIAGNOSTIC STRIPS
Freq: Four times a day (QID) | 5 refills | 0 days | Status: CP
Start: 2017-11-08 — End: ?

## 2017-11-10 ENCOUNTER — Ambulatory Visit: Payer: Medicaid Other | Admitting: Obstetrics & Gynecology

## 2017-11-14 ENCOUNTER — Ambulatory Visit: Payer: Medicaid Other | Admitting: Podiatry

## 2017-11-14 ENCOUNTER — Other Ambulatory Visit: Payer: Self-pay

## 2017-11-14 ENCOUNTER — Ambulatory Visit
Admission: EM | Admit: 2017-11-14 | Discharge: 2017-11-14 | Disposition: A | Discharge status: Home / Self Care | Payer: Medicaid Other | Attending: Family Medicine | Admitting: Family Medicine

## 2017-11-14 DIAGNOSIS — L02215 Cutaneous abscess of perineum: Principal | ICD-10-CM

## 2017-11-14 DIAGNOSIS — L02415 Cutaneous abscess of right lower limb: Secondary | ICD-10-CM

## 2017-11-14 DIAGNOSIS — L0291 Cutaneous abscess, unspecified: Secondary | ICD-10-CM

## 2017-11-14 DIAGNOSIS — L0231 Cutaneous abscess of buttock: Secondary | ICD-10-CM

## 2017-11-14 MED ORDER — KETOROLAC TROMETHAMINE 60 MG/2ML IM SOLN
60.0000 mg | Freq: Once | INTRAMUSCULAR | Status: AC
  Administered 2017-11-14: 60 mg via INTRAMUSCULAR

## 2017-11-14 NOTE — ED Triage Notes (Signed)
Patient complains of multiple abscess in buttock region. Patient states recently had "strep in her thumb" that she was hospitalized for 2 weeks ago. Patient reports that these abscess areas came up 3-4 days ago and have been worsening.

## 2017-11-14 NOTE — ED Provider Notes (Addendum)
[facility]    CSN: [id number] Arrival date & time: 11/14/17  1941     History   Chief Complaint Chief Complaint  Patient presents with  . Abscess    HPI [name] [name] is a 57 y.o. female.   HPI  57 year old female presents with painful abscesses on her proximal thigh and buttock.  States she was hospitalized for a strep infection in her thumb.  She has had these abscesses noticed 3 to 4 days ago and have been worsening.  Multiple morbidities : type 2 diabetes, obesity, pain syndrome, acute on chronic congestive heart failure ,QT prolonged, and COPD.  She complains of chills; She has an temperature today of 99.3.  States she is unable to sit or find any comfortable position due to the pressure on the sores.  Review of medical records :she was seen 11/01/2017 at [facility] With BV and Candida that was treated with Diflucan and Flagyl.         Past Medical History:  Diagnosis Date  . Abdominal wall hernia 01/29/2013  . Anxiety   . Arthritis    Rheumatoid  . C. difficile colitis   . Chronic diastolic heart failure (HCC)   . Depression   . Diabetes mellitus    states no meds or diet restrictions  at present  . Diastolic CHF (HCC)   . Esophagitis   . Fluid retention   . GERD (gastroesophageal reflux disease)   . Hiatal hernia   . Hypertension   . Hypokalemia due to loss of potassium 10/21/2015   Overview:  Associated with 3 weeks of diarrhea  And QT prolongation.  . Hypothyroidism   . IBS (irritable bowel syndrome)   . Moderate episode of recurrent major depressive disorder (HCC) 06/03/2004  . Morbid obesity (HCC)   . Neurogenic bladder    has pacemaker  . Neuropathy   . Obesity   . Panic attacks   . Rheumatoid arthritis (HCC)   . Sleep apnea    STATES SEVERE, CANT TOLERATE MASK- LAST STUDY YEARS AGO    Patient Active Problem List   Diagnosis Date Noted  . Subacute vulvitis 11/01/2017  . Chronic cystitis 03/01/2017  . Chronic pain syndrome  03/31/2016  . Insomnia secondary to chronic pain 03/31/2016  . Chronic upper back pain 12/25/2015  . Chronic hand pain (Bilateral) (L>R) 12/25/2015  . Rheumatoid arthritis (HCC) 12/25/2015  . Morbid (severe) obesity due to excess calories (HCC) 12/24/2015  . Osteoarthritis, multiple sites 12/24/2015  . Chronic foot pain (Location of Primary Source of Pain) (Bilateral) (L>R) 12/24/2015  . Chronic elbow pain (Location of Tertiary source of pain) (Bilateral) (L>R) 12/24/2015  . Chronic shoulder pain (Bilateral) (L>R) 12/24/2015  . Chronic neck pain (Bilateral) (R>L) 12/24/2015  . 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[employer]) 12/23/2015  . Chronic knee pain (Bilateral) (R>L) 12/23/2015  . Long term current use of opiate analgesic 12/23/2015  . Long term prescription opiate use 12/23/2015  . Opiate use (30 MME/Day) 12/23/2015  . Neurogenic pain 12/23/2015  . Neuropathic pain 12/23/2015  . Diabetic peripheral neuropathy (HCC) 12/23/2015  . Encounter for therapeutic drug level monitoring 12/23/2015  . Encounter for pain management planning 12/23/2015  . GERD (gastroesophageal reflux disease) 11/25/2015  . Neuropathy 11/25/2015  . Hypokalemia 10/21/2015  . Hypomagnesemia 10/21/2015  . QT prolongation 10/21/2015  . Type 2 diabetes mellitus with hyperglycemia (HCC) 10/21/2015  . Chronic wrist pain (Location of Secondary source of pain) (Bilateral) (L>R) 03/19/2015  . Adhesive  capsulitis 03/19/2015  . Female genuine stress incontinence 02/14/2015  . Urge incontinence of urine 02/14/2015  . Obstructive apnea 07/03/2014  . Chronic diastolic CHF (congestive heart failure), NYHA class 3 (HCC) 07/03/2014  . Anxiety 01/03/2014  . Diabetes (HCC) 01/03/2014  . COPD (chronic obstructive pulmonary disease) (HCC) 01/03/2014  . Bipolar disorder, unspecified (HCC) 01/03/2014  . Diastolic dysfunction 01/03/2014  . Combined fat and carbohydrate induced hyperlipemia 01/03/2014  .  Shortness of breath 01/03/2014  . Acute on chronic congestive heart failure (HCC) 01/03/2014  . Incomplete bladder emptying 11/02/2012  . Bladder retention 10/10/2012  . Detrusor muscle hypertonia 10/04/2012  . Obstruction of urinary tract 10/04/2012  . FOM (frequency of micturition) 10/04/2012  . Vitamin D deficiency 04/15/2012  . Borderline personality disorder (HCC) 01/06/2012  . Mixed anxiety depressive disorder 01/06/2012  . History of laparoscopic adjustable gastric banding, 03/20/2007.  Removed 09/19/2011. 08/04/2011  . Hypothyroidism 06/28/2010  . Essential (primary) hypertension 01/27/2005  . Major depressive disorder, recurrent episode, moderate (HCC) 06/03/2004    Past Surgical History:  Procedure Laterality Date  . ABDOMINAL HYSTERECTOMY    . CHOLECYSTECTOMY    . EYE SURGERY     bilateral cataract extraction with IOL  . HERNIA REPAIR     ventral hernia with strangulation  . LAPAROSCOPIC GASTRIC BANDING  03/20/07  . TONSILLECTOMY    . TUBAL LIGATION      OB History   None      Home Medications    Prior to Admission medications   Medication Sig Start Date End Date Taking? Authorizing Provider  ARIPiprazole (ABILIFY) 5 MG tablet Take by mouth. 08/22/12  Yes [provider]  atorvastatin (LIPITOR) 80 MG tablet Take by mouth. 02/09/13  Yes [provider]  B-D INSULIN SYRINGE 1CC/25G 25G X 5/8" 1 ML MISC 1 UNITS BY MISCELLANEOUS ROUTE EVERY SEVEN (7) DAYS. TO BE USED WITH METHOTREXATE 12/10/16  Yes [provider]  busPIRone (BUSPAR) 10 MG tablet TAKE 1 TABLET BY MOUTH TWICE A DAY 08/23/17  Yes Clapacs, Jackquline Denmark, MD  Calcium Carb-Ergocalciferol 250-125 MG-UNIT TABS Take by mouth.   Yes [provider]  Cholecalciferol (VITAMIN D-1000 MAX ST) 1000 units tablet Take by mouth.   Yes [provider]  clotrimazole-betamethasone (LOTRISONE) cream Apply 1 application topically 2 (two) times daily. For yeast infection under stomach 05/07/11   Yes [provider]  clotrimazole-betamethasone (LOTRISONE) cream Apply 1 application topically 2 (two) times daily. 11/01/17  Yes Nadara Mustard, MD  Diclofenac Sodium 3 % GEL APPLY 1 APPLICATION TOPICALLY TWO (2) TIMES A DAY. 06/21/17  Yes [provider]  fluconazole (DIFLUCAN) 150 MG tablet Take daily for three days then weekly 11/01/17  Yes Nadara Mustard, MD  FLUoxetine (PROZAC) 20 MG capsule Take 1 capsule (20 mg total) by mouth daily. 08/25/17  Yes Clapacs, Jackquline Denmark, MD  folic acid (FOLVITE) 1 MG tablet Take 1 mg by mouth daily.    Yes [provider]  hydroxychloroquine (PLAQUENIL) 200 MG tablet Take 200 mg by mouth 2 (two) times daily.   Yes [provider]  hydrOXYzine (ATARAX/VISTARIL) 50 MG tablet Take 50 mg by mouth Twice daily.  07/07/11  Yes [provider]  insulin lispro (HUMALOG) 100 UNIT/ML injection Inject 10u AC TID and add SSI: <150=0u, 151-200=2u, 201-250=4u, 251-300=6u, >300mg /dl=8u. Up to 60u daily. 10/31/17  Yes [provider]  KLOR-CON M20 20 MEQ tablet TAKE ONE TABLET BY MOUTH TWICE DAILY Patient taking differently: take 2  tabs in the a.m. , 1 tab in the evening 04/10/13  Yes Arida, Chelsea Aus, MD  LANTUS 100 UNIT/ML injection INJECT 0.2 ML (20 UNITS TOTAL) UNDER THE SKIN NIGHTLY. 10/02/17  Yes [provider]  leflunomide (ARAVA) 10 MG tablet Take 10 mg by mouth daily. 04/27/17  Yes [provider]  levothyroxine (SYNTHROID, LEVOTHROID) 88 MCG tablet Take 88 mcg by mouth daily before breakfast.   Yes [provider]  lisinopril (PRINIVIL,ZESTRIL) 2.5 MG tablet Take 2.5 mg by mouth daily.   Yes [provider]  loperamide (IMODIUM) 2 MG capsule Take 2 mg by mouth as needed.  06/21/16  Yes [provider]  lovastatin (MEVACOR) 40 MG tablet Take 40 mg by mouth daily.    Yes [provider]  Melatonin 10 MG CAPS Take by mouth. 05/30/16  Yes [provider]  metFORMIN  (GLUCOPHAGE) 500 MG tablet Take 500 mg by mouth 2 (two) times daily with a meal.    Yes [provider]  methotrexate 50 MG/2ML injection Inject into the muscle.   Yes [provider]  oxyCODONE-acetaminophen (PERCOCET/ROXICET) 5-325 MG tablet Take by mouth.   Yes [provider]  predniSONE (DELTASONE) 10 MG tablet Take 40 mg by mouth daily as needed (Pain scale  Used to determine dosage). Reported on 08/29/2015   Yes [provider]  pregabalin (LYRICA) 150 MG capsule Take 1 capsule (150 mg total) by mouth 3 (three) times daily. 09/14/17 12/13/17 Yes King, Shana Chute, NP  promethazine (PHENERGAN) 25 MG tablet TAKE 1 TABLET BY MOUTH EVERY 6 HOURS AS NEEDED NAUSEA OR VOMITING 10/18/17  Yes [provider]  QUEtiapine (SEROQUEL) 300 MG tablet TAKE 1 TABLET BY MOUTH EVERYDAY AT BEDTIME 08/23/17  Yes Clapacs, Jackquline Denmark, MD  Vitamin D, Ergocalciferol, (DRISDOL) 50000 units CAPS capsule Take by mouth.   Yes [provider]  zolpidem (AMBIEN) 5 MG tablet Take 1 tablet (5 mg total) by mouth at bedtime. 08/25/17  Yes Clapacs, Jackquline Denmark, MD  metroNIDAZOLE (FLAGYL) 500 MG tablet Take 1 tablet (500 mg total) by mouth 2 (two) times daily. 11/01/17   Nadara Mustard, MD    Family History Family History  Problem Relation Age of Onset  . Heart failure Father   . Bipolar disorder Father   . Alcohol abuse Father   . Anxiety disorder Father   . Depression Father   . Heart disease Brother   . Heart attack Brother 33       MI s/p stents placed  . Anxiety disorder Sister   . Depression Sister   . Anxiety disorder Sister   . Depression Sister   . Bipolar disorder Sister   . Alcohol abuse Sister   . Drug abuse Sister   . Heart attack Brother     Social History Social History   Tobacco Use  . Smoking status: Former Smoker    Packs/day: 2.00    Years: 27.00    Pack years: 54.00    Types: Cigarettes    Last attempt to quit: 07/30/1999    Years since quitting:  18.3  . Smokeless tobacco: Never Used  Substance Use Topics  . Alcohol use: No  . Drug use: No     Allergies   Codeine; Propoxyphene; Sulfa antibiotics; and Hydrocodone   Review of Systems Review of Systems  Constitutional: Positive for activity change, chills and fatigue. Negative for fever.  Skin: Positive for wound.  All other systems reviewed and are  negative.    Physical Exam Triage Vital Signs ED Triage Vitals [11/14/17 1952]  Enc Vitals Group     BP 136/82     Pulse Rate (!) 103     Resp 18     Temp 99.3 F (37.4 C)     Temp Source Oral     SpO2 97 %     Weight (!) 319 lb (144.7 kg)     Height 5' 5.5" (1.664 m)     Head Circumference      Peak Flow      Pain Score 10     Pain Loc      Pain Edu?      Excl. in GC?    No data found.  Updated Vital Signs BP 136/82 (BP Location: Left Arm)   Pulse (!) 103   Temp 99.3 F (37.4 C) (Oral)   Resp 18   Ht 5' 5.5" (1.664 m)   Wt (!) 319 lb (144.7 kg)   LMP 04/20/2001   SpO2 97%   BMI 52.28 kg/m    Visual Acuity Right Eye Distance:   Left Eye Distance:   Bilateral Distance:    Right Eye Near:   Left Eye Near:    Bilateral Near:     Physical Exam  Constitutional: She is oriented to person, place, and time. She appears well-developed and well-nourished. She appears distressed.  HENT:  Head: Normocephalic.  Eyes: Pupils are equal, round, and reactive to light.  Neck: Normal range of motion.  Musculoskeletal: Normal range of motion.  Neurological: She is alert and oriented to person, place, and time.  Skin: Rash noted. She is not diaphoretic. There is erythema.  Examination of the perineum area was performed with Efraim Kaufmann, CMA as assistant chaperone.  Large abscess that is indurated with a small superficial  sore.  There is not much fluctuance Induration measures approximately 4 inches x 3 inches .  Is another abscess posteriorly near the buttocks fold.  These are both on the right side.  There is appears  to be a small abscess forming on the left perineum.  Psychiatric: She has a normal mood and affect. Her behavior is normal. Judgment and thought content normal.  Nursing note and vitals reviewed.    UC Treatments / Results  Labs (all labs ordered are listed, but only abnormal results are displayed) Labs Reviewed - No data to display  EKG None  Radiology No results found.  Procedures Procedures (including critical care time)  Medications Ordered in UC Medications  ketorolac (TORADOL) injection 60 mg (60 mg Intramuscular Given 11/14/17 2010)    Initial Impression / Assessment and Plan / UC Course  I have reviewed the triage vital signs and the nursing notes.  Pertinent labs & imaging results that were available during my care of the patient were reviewed by me and considered in my medical decision making (see chart for details).   Discussed with the patient she would need to go to the emergency department for more extensive treatment than I can provide at our facility.  He states she understood.  She chose to go to [facility]; she has been admitted there in the past.  Left in a privately owned vehicle in stable condition.  She received a 60 mg of Toradol intramuscularly for pain control.   Final Clinical Impressions(s) / UC Diagnoses   Final diagnoses:  Abscess  Abscess of buttock, right   Discharge Instructions   None  ED Prescriptions    None     Controlled Substance Prescriptions [place] Controlled Substance Registry consulted? Not Applicable  Lutricia Feil, PA-C 11/14/17 2023  Lutricia Feil, PA-C 11/14/17 2025

## 2017-11-15 ENCOUNTER — Ambulatory Visit: Payer: Medicaid Other | Admitting: Obstetrics & Gynecology

## 2017-11-15 ENCOUNTER — Ambulatory Visit
Admit: 2017-11-15 | Discharge: 2017-11-18 | Disposition: A | Discharge status: Home Health | Payer: MEDICAID | Source: Ambulatory Visit

## 2017-11-15 ENCOUNTER — Encounter
Admit: 2017-11-15 | Discharge: 2017-11-18 | Disposition: A | Discharge status: Home Health | Payer: MEDICAID | Source: Ambulatory Visit | Attending: Certified Registered"

## 2017-11-15 ENCOUNTER — Encounter
Admit: 2017-11-15 | Discharge: 2017-11-18 | Disposition: A | Discharge status: Home Health | Payer: MEDICAID | Source: Ambulatory Visit

## 2017-11-15 DIAGNOSIS — L02215 Cutaneous abscess of perineum: Principal | ICD-10-CM

## 2017-11-15 DIAGNOSIS — L0390 Cellulitis, unspecified: Secondary | ICD-10-CM | POA: Insufficient documentation

## 2017-11-17 DIAGNOSIS — A4102 Sepsis due to Methicillin resistant Staphylococcus aureus: Secondary | ICD-10-CM | POA: Insufficient documentation

## 2017-11-17 DIAGNOSIS — A4902 Methicillin resistant Staphylococcus aureus infection, unspecified site: Secondary | ICD-10-CM

## 2017-11-17 HISTORY — DX: Methicillin resistant Staphylococcus aureus infection, unspecified site: A49.02

## 2017-11-18 DIAGNOSIS — R350 Frequency of micturition: Secondary | ICD-10-CM

## 2017-11-18 DIAGNOSIS — R339 Retention of urine, unspecified: Secondary | ICD-10-CM

## 2017-11-18 DIAGNOSIS — E1142 Type 2 diabetes mellitus with diabetic polyneuropathy: Secondary | ICD-10-CM

## 2017-11-18 DIAGNOSIS — B9562 Methicillin resistant Staphylococcus aureus infection as the cause of diseases classified elsewhere: Secondary | ICD-10-CM

## 2017-11-18 DIAGNOSIS — I110 Hypertensive heart disease with heart failure: Secondary | ICD-10-CM

## 2017-11-18 DIAGNOSIS — L03315 Cellulitis of perineum: Secondary | ICD-10-CM

## 2017-11-18 DIAGNOSIS — E039 Hypothyroidism, unspecified: Secondary | ICD-10-CM

## 2017-11-18 DIAGNOSIS — L97521 Non-pressure chronic ulcer of other part of left foot limited to breakdown of skin: Secondary | ICD-10-CM

## 2017-11-18 DIAGNOSIS — E11621 Type 2 diabetes mellitus with foot ulcer: Secondary | ICD-10-CM

## 2017-11-18 DIAGNOSIS — L0231 Cutaneous abscess of buttock: Secondary | ICD-10-CM

## 2017-11-18 DIAGNOSIS — L02215 Cutaneous abscess of perineum: Principal | ICD-10-CM

## 2017-11-18 DIAGNOSIS — F319 Bipolar disorder, unspecified: Secondary | ICD-10-CM

## 2017-11-18 DIAGNOSIS — I5032 Chronic diastolic (congestive) heart failure: Secondary | ICD-10-CM

## 2017-11-18 DIAGNOSIS — N3946 Mixed incontinence: Secondary | ICD-10-CM

## 2017-11-18 DIAGNOSIS — Z87440 Personal history of urinary (tract) infections: Secondary | ICD-10-CM

## 2017-11-18 DIAGNOSIS — G4733 Obstructive sleep apnea (adult) (pediatric): Secondary | ICD-10-CM

## 2017-11-18 DIAGNOSIS — Z466 Encounter for fitting and adjustment of urinary device: Secondary | ICD-10-CM

## 2017-11-18 DIAGNOSIS — M069 Rheumatoid arthritis, unspecified: Secondary | ICD-10-CM

## 2017-11-18 DIAGNOSIS — Z794 Long term (current) use of insulin: Secondary | ICD-10-CM

## 2017-11-18 DIAGNOSIS — J449 Chronic obstructive pulmonary disease, unspecified: Secondary | ICD-10-CM

## 2017-11-18 DIAGNOSIS — E559 Vitamin D deficiency, unspecified: Secondary | ICD-10-CM

## 2017-11-18 DIAGNOSIS — F419 Anxiety disorder, unspecified: Secondary | ICD-10-CM

## 2017-11-18 DIAGNOSIS — G4700 Insomnia, unspecified: Secondary | ICD-10-CM

## 2017-11-18 DIAGNOSIS — G894 Chronic pain syndrome: Secondary | ICD-10-CM

## 2017-11-18 DIAGNOSIS — Z9181 History of falling: Secondary | ICD-10-CM

## 2017-11-18 DIAGNOSIS — Z6841 Body Mass Index (BMI) 40.0 and over, adult: Secondary | ICD-10-CM

## 2017-11-18 DIAGNOSIS — E785 Hyperlipidemia, unspecified: Secondary | ICD-10-CM

## 2017-11-18 MED ORDER — PREDNISONE 10 MG TABLET
ORAL_TABLET | ORAL | 0 refills | 0.00000 days | Status: CP
Start: 2017-11-18 — End: 2017-12-22

## 2017-11-18 MED ORDER — MAGNESIUM OXIDE 400 MG (241.3 MG MAGNESIUM) TABLET
ORAL_TABLET | Freq: Every day | ORAL | 0 refills | 0.00000 days | Status: CP
Start: 2017-11-18 — End: 2017-12-18

## 2017-11-18 MED ORDER — CLINDAMYCIN HCL 150 MG CAPSULE
ORAL_CAPSULE | Freq: Three times a day (TID) | ORAL | 0 refills | 0.00000 days | Status: SS
Start: 2017-11-18 — End: 2017-12-04

## 2017-11-18 MED ORDER — CEPHALEXIN 500 MG CAPSULE
ORAL_CAPSULE | Freq: Four times a day (QID) | ORAL | 0 refills | 0.00000 days | Status: CP
Start: 2017-11-18 — End: 2017-11-19

## 2017-11-19 ENCOUNTER — Encounter: Admit: 2017-11-19 | Discharge: 2017-12-26 | Payer: MEDICAID

## 2017-11-19 ENCOUNTER — Inpatient Hospital Stay: Admit: 2017-11-19 | Discharge: 2017-12-26 | Payer: MEDICAID

## 2017-11-19 DIAGNOSIS — Z466 Encounter for fitting and adjustment of urinary device: Secondary | ICD-10-CM

## 2017-11-19 DIAGNOSIS — M069 Rheumatoid arthritis, unspecified: Secondary | ICD-10-CM

## 2017-11-19 DIAGNOSIS — Z9181 History of falling: Secondary | ICD-10-CM

## 2017-11-19 DIAGNOSIS — L97521 Non-pressure chronic ulcer of other part of left foot limited to breakdown of skin: Secondary | ICD-10-CM

## 2017-11-19 DIAGNOSIS — E559 Vitamin D deficiency, unspecified: Secondary | ICD-10-CM

## 2017-11-19 DIAGNOSIS — L02215 Cutaneous abscess of perineum: Principal | ICD-10-CM

## 2017-11-19 DIAGNOSIS — I110 Hypertensive heart disease with heart failure: Secondary | ICD-10-CM

## 2017-11-19 DIAGNOSIS — J449 Chronic obstructive pulmonary disease, unspecified: Secondary | ICD-10-CM

## 2017-11-19 DIAGNOSIS — F319 Bipolar disorder, unspecified: Secondary | ICD-10-CM

## 2017-11-19 DIAGNOSIS — R339 Retention of urine, unspecified: Secondary | ICD-10-CM

## 2017-11-19 DIAGNOSIS — R350 Frequency of micturition: Secondary | ICD-10-CM

## 2017-11-19 DIAGNOSIS — E785 Hyperlipidemia, unspecified: Secondary | ICD-10-CM

## 2017-11-19 DIAGNOSIS — I5032 Chronic diastolic (congestive) heart failure: Secondary | ICD-10-CM

## 2017-11-19 DIAGNOSIS — G4700 Insomnia, unspecified: Secondary | ICD-10-CM

## 2017-11-19 DIAGNOSIS — G4733 Obstructive sleep apnea (adult) (pediatric): Secondary | ICD-10-CM

## 2017-11-19 DIAGNOSIS — Z6841 Body Mass Index (BMI) 40.0 and over, adult: Secondary | ICD-10-CM

## 2017-11-19 DIAGNOSIS — Z87440 Personal history of urinary (tract) infections: Secondary | ICD-10-CM

## 2017-11-19 DIAGNOSIS — L0231 Cutaneous abscess of buttock: Secondary | ICD-10-CM

## 2017-11-19 DIAGNOSIS — G894 Chronic pain syndrome: Secondary | ICD-10-CM

## 2017-11-19 DIAGNOSIS — E11621 Type 2 diabetes mellitus with foot ulcer: Secondary | ICD-10-CM

## 2017-11-19 DIAGNOSIS — F419 Anxiety disorder, unspecified: Secondary | ICD-10-CM

## 2017-11-19 DIAGNOSIS — E039 Hypothyroidism, unspecified: Secondary | ICD-10-CM

## 2017-11-19 DIAGNOSIS — N3946 Mixed incontinence: Secondary | ICD-10-CM

## 2017-11-19 DIAGNOSIS — Z794 Long term (current) use of insulin: Secondary | ICD-10-CM

## 2017-11-19 DIAGNOSIS — L03315 Cellulitis of perineum: Secondary | ICD-10-CM

## 2017-11-19 DIAGNOSIS — E1142 Type 2 diabetes mellitus with diabetic polyneuropathy: Secondary | ICD-10-CM

## 2017-11-19 DIAGNOSIS — B9562 Methicillin resistant Staphylococcus aureus infection as the cause of diseases classified elsewhere: Secondary | ICD-10-CM

## 2017-11-21 ENCOUNTER — Ambulatory Visit: Admit: 2017-11-21 | Discharge: 2017-11-22 | Payer: MEDICAID

## 2017-11-21 DIAGNOSIS — E039 Hypothyroidism, unspecified: Secondary | ICD-10-CM

## 2017-11-21 DIAGNOSIS — F419 Anxiety disorder, unspecified: Secondary | ICD-10-CM

## 2017-11-21 DIAGNOSIS — E785 Hyperlipidemia, unspecified: Secondary | ICD-10-CM

## 2017-11-21 DIAGNOSIS — R339 Retention of urine, unspecified: Secondary | ICD-10-CM

## 2017-11-21 DIAGNOSIS — J449 Chronic obstructive pulmonary disease, unspecified: Secondary | ICD-10-CM

## 2017-11-21 DIAGNOSIS — L02215 Cutaneous abscess of perineum: Principal | ICD-10-CM

## 2017-11-21 DIAGNOSIS — I110 Hypertensive heart disease with heart failure: Secondary | ICD-10-CM

## 2017-11-21 DIAGNOSIS — Z6841 Body Mass Index (BMI) 40.0 and over, adult: Secondary | ICD-10-CM

## 2017-11-21 DIAGNOSIS — Z794 Long term (current) use of insulin: Secondary | ICD-10-CM

## 2017-11-21 DIAGNOSIS — Z9181 History of falling: Secondary | ICD-10-CM

## 2017-11-21 DIAGNOSIS — L03315 Cellulitis of perineum: Secondary | ICD-10-CM

## 2017-11-21 DIAGNOSIS — M069 Rheumatoid arthritis, unspecified: Secondary | ICD-10-CM

## 2017-11-21 DIAGNOSIS — L97521 Non-pressure chronic ulcer of other part of left foot limited to breakdown of skin: Secondary | ICD-10-CM

## 2017-11-21 DIAGNOSIS — L0231 Cutaneous abscess of buttock: Secondary | ICD-10-CM

## 2017-11-21 DIAGNOSIS — G4733 Obstructive sleep apnea (adult) (pediatric): Secondary | ICD-10-CM

## 2017-11-21 DIAGNOSIS — G894 Chronic pain syndrome: Secondary | ICD-10-CM

## 2017-11-21 DIAGNOSIS — G4700 Insomnia, unspecified: Secondary | ICD-10-CM

## 2017-11-21 DIAGNOSIS — E1142 Type 2 diabetes mellitus with diabetic polyneuropathy: Secondary | ICD-10-CM

## 2017-11-21 DIAGNOSIS — R350 Frequency of micturition: Secondary | ICD-10-CM

## 2017-11-21 DIAGNOSIS — F319 Bipolar disorder, unspecified: Secondary | ICD-10-CM

## 2017-11-21 DIAGNOSIS — E11621 Type 2 diabetes mellitus with foot ulcer: Secondary | ICD-10-CM

## 2017-11-21 DIAGNOSIS — E559 Vitamin D deficiency, unspecified: Secondary | ICD-10-CM

## 2017-11-21 DIAGNOSIS — Z466 Encounter for fitting and adjustment of urinary device: Secondary | ICD-10-CM

## 2017-11-21 DIAGNOSIS — B9562 Methicillin resistant Staphylococcus aureus infection as the cause of diseases classified elsewhere: Secondary | ICD-10-CM

## 2017-11-21 DIAGNOSIS — N3946 Mixed incontinence: Secondary | ICD-10-CM

## 2017-11-21 DIAGNOSIS — Z87440 Personal history of urinary (tract) infections: Secondary | ICD-10-CM

## 2017-11-21 DIAGNOSIS — I5032 Chronic diastolic (congestive) heart failure: Secondary | ICD-10-CM

## 2017-11-24 DIAGNOSIS — Z6841 Body Mass Index (BMI) 40.0 and over, adult: Secondary | ICD-10-CM

## 2017-11-24 DIAGNOSIS — R350 Frequency of micturition: Secondary | ICD-10-CM

## 2017-11-24 DIAGNOSIS — G4733 Obstructive sleep apnea (adult) (pediatric): Secondary | ICD-10-CM

## 2017-11-24 DIAGNOSIS — J449 Chronic obstructive pulmonary disease, unspecified: Secondary | ICD-10-CM

## 2017-11-24 DIAGNOSIS — L03315 Cellulitis of perineum: Secondary | ICD-10-CM

## 2017-11-24 DIAGNOSIS — E1142 Type 2 diabetes mellitus with diabetic polyneuropathy: Secondary | ICD-10-CM

## 2017-11-24 DIAGNOSIS — L02215 Cutaneous abscess of perineum: Principal | ICD-10-CM

## 2017-11-24 DIAGNOSIS — I5032 Chronic diastolic (congestive) heart failure: Secondary | ICD-10-CM

## 2017-11-24 DIAGNOSIS — G4700 Insomnia, unspecified: Secondary | ICD-10-CM

## 2017-11-24 DIAGNOSIS — Z794 Long term (current) use of insulin: Secondary | ICD-10-CM

## 2017-11-24 DIAGNOSIS — Z9181 History of falling: Secondary | ICD-10-CM

## 2017-11-24 DIAGNOSIS — N3946 Mixed incontinence: Secondary | ICD-10-CM

## 2017-11-24 DIAGNOSIS — I110 Hypertensive heart disease with heart failure: Secondary | ICD-10-CM

## 2017-11-24 DIAGNOSIS — E039 Hypothyroidism, unspecified: Secondary | ICD-10-CM

## 2017-11-24 DIAGNOSIS — F319 Bipolar disorder, unspecified: Secondary | ICD-10-CM

## 2017-11-24 DIAGNOSIS — E559 Vitamin D deficiency, unspecified: Secondary | ICD-10-CM

## 2017-11-24 DIAGNOSIS — E11621 Type 2 diabetes mellitus with foot ulcer: Secondary | ICD-10-CM

## 2017-11-24 DIAGNOSIS — L0231 Cutaneous abscess of buttock: Secondary | ICD-10-CM

## 2017-11-24 DIAGNOSIS — B9562 Methicillin resistant Staphylococcus aureus infection as the cause of diseases classified elsewhere: Secondary | ICD-10-CM

## 2017-11-24 DIAGNOSIS — M069 Rheumatoid arthritis, unspecified: Secondary | ICD-10-CM

## 2017-11-24 DIAGNOSIS — Z466 Encounter for fitting and adjustment of urinary device: Secondary | ICD-10-CM

## 2017-11-24 DIAGNOSIS — E785 Hyperlipidemia, unspecified: Secondary | ICD-10-CM

## 2017-11-24 DIAGNOSIS — R339 Retention of urine, unspecified: Secondary | ICD-10-CM

## 2017-11-24 DIAGNOSIS — F419 Anxiety disorder, unspecified: Secondary | ICD-10-CM

## 2017-11-24 DIAGNOSIS — Z87440 Personal history of urinary (tract) infections: Secondary | ICD-10-CM

## 2017-11-24 DIAGNOSIS — L97521 Non-pressure chronic ulcer of other part of left foot limited to breakdown of skin: Secondary | ICD-10-CM

## 2017-11-24 DIAGNOSIS — G894 Chronic pain syndrome: Secondary | ICD-10-CM

## 2017-11-28 ENCOUNTER — Telehealth: Payer: Self-pay

## 2017-11-28 ENCOUNTER — Telehealth: Payer: Self-pay | Admitting: Pain Medicine

## 2017-11-28 DIAGNOSIS — E11621 Type 2 diabetes mellitus with foot ulcer: Secondary | ICD-10-CM

## 2017-11-28 DIAGNOSIS — E559 Vitamin D deficiency, unspecified: Secondary | ICD-10-CM

## 2017-11-28 DIAGNOSIS — Z794 Long term (current) use of insulin: Secondary | ICD-10-CM

## 2017-11-28 DIAGNOSIS — M069 Rheumatoid arthritis, unspecified: Secondary | ICD-10-CM

## 2017-11-28 DIAGNOSIS — G4733 Obstructive sleep apnea (adult) (pediatric): Secondary | ICD-10-CM

## 2017-11-28 DIAGNOSIS — Z87440 Personal history of urinary (tract) infections: Secondary | ICD-10-CM

## 2017-11-28 DIAGNOSIS — L97521 Non-pressure chronic ulcer of other part of left foot limited to breakdown of skin: Secondary | ICD-10-CM

## 2017-11-28 DIAGNOSIS — F319 Bipolar disorder, unspecified: Secondary | ICD-10-CM

## 2017-11-28 DIAGNOSIS — J449 Chronic obstructive pulmonary disease, unspecified: Secondary | ICD-10-CM

## 2017-11-28 DIAGNOSIS — G4700 Insomnia, unspecified: Secondary | ICD-10-CM

## 2017-11-28 DIAGNOSIS — Z6841 Body Mass Index (BMI) 40.0 and over, adult: Secondary | ICD-10-CM

## 2017-11-28 DIAGNOSIS — I5032 Chronic diastolic (congestive) heart failure: Secondary | ICD-10-CM

## 2017-11-28 DIAGNOSIS — E1142 Type 2 diabetes mellitus with diabetic polyneuropathy: Secondary | ICD-10-CM

## 2017-11-28 DIAGNOSIS — G894 Chronic pain syndrome: Secondary | ICD-10-CM

## 2017-11-28 DIAGNOSIS — L03315 Cellulitis of perineum: Secondary | ICD-10-CM

## 2017-11-28 DIAGNOSIS — F419 Anxiety disorder, unspecified: Secondary | ICD-10-CM

## 2017-11-28 DIAGNOSIS — B9562 Methicillin resistant Staphylococcus aureus infection as the cause of diseases classified elsewhere: Secondary | ICD-10-CM

## 2017-11-28 DIAGNOSIS — Z466 Encounter for fitting and adjustment of urinary device: Secondary | ICD-10-CM

## 2017-11-28 DIAGNOSIS — L02215 Cutaneous abscess of perineum: Principal | ICD-10-CM

## 2017-11-28 DIAGNOSIS — N3946 Mixed incontinence: Secondary | ICD-10-CM

## 2017-11-28 DIAGNOSIS — I110 Hypertensive heart disease with heart failure: Secondary | ICD-10-CM

## 2017-11-28 DIAGNOSIS — Z9181 History of falling: Secondary | ICD-10-CM

## 2017-11-28 DIAGNOSIS — E785 Hyperlipidemia, unspecified: Secondary | ICD-10-CM

## 2017-11-28 DIAGNOSIS — L0231 Cutaneous abscess of buttock: Secondary | ICD-10-CM

## 2017-11-28 DIAGNOSIS — E039 Hypothyroidism, unspecified: Secondary | ICD-10-CM

## 2017-11-28 DIAGNOSIS — R339 Retention of urine, unspecified: Secondary | ICD-10-CM

## 2017-11-28 DIAGNOSIS — R350 Frequency of micturition: Secondary | ICD-10-CM

## 2017-11-28 MED ORDER — DICLOFENAC 3 % TOPICAL GEL
Freq: Two times a day (BID) | TOPICAL | 2 refills | 0 days | Status: CP
Start: 2017-11-28 — End: 2018-03-08

## 2017-11-28 NOTE — Telephone Encounter (Signed)
Out of meds needs prior auth per phone call from daughter.

## 2017-11-29 ENCOUNTER — Telehealth: Payer: Self-pay

## 2017-11-29 DIAGNOSIS — Z6841 Body Mass Index (BMI) 40.0 and over, adult: Secondary | ICD-10-CM

## 2017-11-29 DIAGNOSIS — I110 Hypertensive heart disease with heart failure: Secondary | ICD-10-CM

## 2017-11-29 DIAGNOSIS — L02215 Cutaneous abscess of perineum: Principal | ICD-10-CM

## 2017-11-29 DIAGNOSIS — E559 Vitamin D deficiency, unspecified: Secondary | ICD-10-CM

## 2017-11-29 DIAGNOSIS — G894 Chronic pain syndrome: Secondary | ICD-10-CM

## 2017-11-29 DIAGNOSIS — B9562 Methicillin resistant Staphylococcus aureus infection as the cause of diseases classified elsewhere: Secondary | ICD-10-CM

## 2017-11-29 DIAGNOSIS — N3946 Mixed incontinence: Secondary | ICD-10-CM

## 2017-11-29 DIAGNOSIS — Z794 Long term (current) use of insulin: Secondary | ICD-10-CM

## 2017-11-29 DIAGNOSIS — E039 Hypothyroidism, unspecified: Secondary | ICD-10-CM

## 2017-11-29 DIAGNOSIS — F319 Bipolar disorder, unspecified: Secondary | ICD-10-CM

## 2017-11-29 DIAGNOSIS — I5032 Chronic diastolic (congestive) heart failure: Secondary | ICD-10-CM

## 2017-11-29 DIAGNOSIS — Z9181 History of falling: Secondary | ICD-10-CM

## 2017-11-29 DIAGNOSIS — Z466 Encounter for fitting and adjustment of urinary device: Secondary | ICD-10-CM

## 2017-11-29 DIAGNOSIS — R339 Retention of urine, unspecified: Secondary | ICD-10-CM

## 2017-11-29 DIAGNOSIS — E11621 Type 2 diabetes mellitus with foot ulcer: Secondary | ICD-10-CM

## 2017-11-29 DIAGNOSIS — G4733 Obstructive sleep apnea (adult) (pediatric): Secondary | ICD-10-CM

## 2017-11-29 DIAGNOSIS — F419 Anxiety disorder, unspecified: Secondary | ICD-10-CM

## 2017-11-29 DIAGNOSIS — M069 Rheumatoid arthritis, unspecified: Secondary | ICD-10-CM

## 2017-11-29 DIAGNOSIS — E785 Hyperlipidemia, unspecified: Secondary | ICD-10-CM

## 2017-11-29 DIAGNOSIS — Z87440 Personal history of urinary (tract) infections: Secondary | ICD-10-CM

## 2017-11-29 DIAGNOSIS — L0231 Cutaneous abscess of buttock: Secondary | ICD-10-CM

## 2017-11-29 DIAGNOSIS — L03315 Cellulitis of perineum: Secondary | ICD-10-CM

## 2017-11-29 DIAGNOSIS — L97521 Non-pressure chronic ulcer of other part of left foot limited to breakdown of skin: Secondary | ICD-10-CM

## 2017-11-29 DIAGNOSIS — R350 Frequency of micturition: Secondary | ICD-10-CM

## 2017-11-29 DIAGNOSIS — E1142 Type 2 diabetes mellitus with diabetic polyneuropathy: Secondary | ICD-10-CM

## 2017-11-29 DIAGNOSIS — G4700 Insomnia, unspecified: Secondary | ICD-10-CM

## 2017-11-29 DIAGNOSIS — J449 Chronic obstructive pulmonary disease, unspecified: Secondary | ICD-10-CM

## 2017-11-29 NOTE — Telephone Encounter (Signed)
Spoke with patient to seee which medication needed a PA.  Patient states is was the oxycodone.  PA for oxycodone sent 11-28-17.

## 2017-12-01 ENCOUNTER — Telehealth: Payer: Self-pay | Admitting: Nurse Practitioner

## 2017-12-01 DIAGNOSIS — Z87440 Personal history of urinary (tract) infections: Secondary | ICD-10-CM

## 2017-12-01 DIAGNOSIS — Z794 Long term (current) use of insulin: Secondary | ICD-10-CM

## 2017-12-01 DIAGNOSIS — R350 Frequency of micturition: Secondary | ICD-10-CM

## 2017-12-01 DIAGNOSIS — L03315 Cellulitis of perineum: Secondary | ICD-10-CM

## 2017-12-01 DIAGNOSIS — I110 Hypertensive heart disease with heart failure: Secondary | ICD-10-CM

## 2017-12-01 DIAGNOSIS — L02215 Cutaneous abscess of perineum: Principal | ICD-10-CM

## 2017-12-01 DIAGNOSIS — M069 Rheumatoid arthritis, unspecified: Secondary | ICD-10-CM

## 2017-12-01 DIAGNOSIS — J449 Chronic obstructive pulmonary disease, unspecified: Secondary | ICD-10-CM

## 2017-12-01 DIAGNOSIS — E785 Hyperlipidemia, unspecified: Secondary | ICD-10-CM

## 2017-12-01 DIAGNOSIS — E11621 Type 2 diabetes mellitus with foot ulcer: Secondary | ICD-10-CM

## 2017-12-01 DIAGNOSIS — Z9181 History of falling: Secondary | ICD-10-CM

## 2017-12-01 DIAGNOSIS — F419 Anxiety disorder, unspecified: Secondary | ICD-10-CM

## 2017-12-01 DIAGNOSIS — L97521 Non-pressure chronic ulcer of other part of left foot limited to breakdown of skin: Secondary | ICD-10-CM

## 2017-12-01 DIAGNOSIS — G894 Chronic pain syndrome: Secondary | ICD-10-CM

## 2017-12-01 DIAGNOSIS — E039 Hypothyroidism, unspecified: Secondary | ICD-10-CM

## 2017-12-01 DIAGNOSIS — I5032 Chronic diastolic (congestive) heart failure: Secondary | ICD-10-CM

## 2017-12-01 DIAGNOSIS — N3946 Mixed incontinence: Secondary | ICD-10-CM

## 2017-12-01 DIAGNOSIS — E1142 Type 2 diabetes mellitus with diabetic polyneuropathy: Secondary | ICD-10-CM

## 2017-12-01 DIAGNOSIS — R339 Retention of urine, unspecified: Secondary | ICD-10-CM

## 2017-12-01 DIAGNOSIS — B9562 Methicillin resistant Staphylococcus aureus infection as the cause of diseases classified elsewhere: Secondary | ICD-10-CM

## 2017-12-01 DIAGNOSIS — L0231 Cutaneous abscess of buttock: Secondary | ICD-10-CM

## 2017-12-01 DIAGNOSIS — F319 Bipolar disorder, unspecified: Secondary | ICD-10-CM

## 2017-12-01 DIAGNOSIS — E559 Vitamin D deficiency, unspecified: Secondary | ICD-10-CM

## 2017-12-01 DIAGNOSIS — Z6841 Body Mass Index (BMI) 40.0 and over, adult: Secondary | ICD-10-CM

## 2017-12-01 DIAGNOSIS — G4733 Obstructive sleep apnea (adult) (pediatric): Secondary | ICD-10-CM

## 2017-12-01 DIAGNOSIS — Z466 Encounter for fitting and adjustment of urinary device: Secondary | ICD-10-CM

## 2017-12-01 DIAGNOSIS — G4700 Insomnia, unspecified: Secondary | ICD-10-CM

## 2017-12-01 NOTE — Telephone Encounter (Addendum)
Prior [name] [name] is still needed for Percocet thru Medicaid, pharmacy told patient they still havent received Phone is [phone]

## 2017-12-01 NOTE — Telephone Encounter (Signed)
Called patient and instructed her that her PA was sent on 11/28/17

## 2017-12-02 ENCOUNTER — Ambulatory Visit
Admit: 2017-12-02 | Discharge: 2017-12-10 | Disposition: A | Discharge status: Home Health | Payer: MEDICAID | Source: Ambulatory Visit | Admitting: Hospitalist

## 2017-12-02 ENCOUNTER — Ambulatory Visit: Admit: 2017-12-02 | Discharge: 2017-12-03 | Payer: MEDICAID | Attending: Trauma Surgery | Primary: Trauma Surgery

## 2017-12-02 DIAGNOSIS — L03115 Cellulitis of right lower limb: Principal | ICD-10-CM

## 2017-12-02 DIAGNOSIS — L03317 Cellulitis of buttock: Principal | ICD-10-CM

## 2017-12-02 DIAGNOSIS — L02415 Cutaneous abscess of right lower limb: Secondary | ICD-10-CM

## 2017-12-05 DIAGNOSIS — J449 Chronic obstructive pulmonary disease, unspecified: Secondary | ICD-10-CM

## 2017-12-05 DIAGNOSIS — E1142 Type 2 diabetes mellitus with diabetic polyneuropathy: Secondary | ICD-10-CM

## 2017-12-05 DIAGNOSIS — I110 Hypertensive heart disease with heart failure: Secondary | ICD-10-CM

## 2017-12-05 DIAGNOSIS — E11621 Type 2 diabetes mellitus with foot ulcer: Secondary | ICD-10-CM

## 2017-12-05 DIAGNOSIS — L0231 Cutaneous abscess of buttock: Secondary | ICD-10-CM

## 2017-12-05 DIAGNOSIS — Z87440 Personal history of urinary (tract) infections: Secondary | ICD-10-CM

## 2017-12-05 DIAGNOSIS — I5032 Chronic diastolic (congestive) heart failure: Secondary | ICD-10-CM

## 2017-12-05 DIAGNOSIS — Z466 Encounter for fitting and adjustment of urinary device: Secondary | ICD-10-CM

## 2017-12-05 DIAGNOSIS — Z9181 History of falling: Secondary | ICD-10-CM

## 2017-12-05 DIAGNOSIS — F319 Bipolar disorder, unspecified: Secondary | ICD-10-CM

## 2017-12-05 DIAGNOSIS — B9562 Methicillin resistant Staphylococcus aureus infection as the cause of diseases classified elsewhere: Secondary | ICD-10-CM

## 2017-12-05 DIAGNOSIS — G4700 Insomnia, unspecified: Secondary | ICD-10-CM

## 2017-12-05 DIAGNOSIS — E559 Vitamin D deficiency, unspecified: Secondary | ICD-10-CM

## 2017-12-05 DIAGNOSIS — L02215 Cutaneous abscess of perineum: Principal | ICD-10-CM

## 2017-12-05 DIAGNOSIS — G4733 Obstructive sleep apnea (adult) (pediatric): Secondary | ICD-10-CM

## 2017-12-05 DIAGNOSIS — Z6841 Body Mass Index (BMI) 40.0 and over, adult: Secondary | ICD-10-CM

## 2017-12-05 DIAGNOSIS — M069 Rheumatoid arthritis, unspecified: Secondary | ICD-10-CM

## 2017-12-05 DIAGNOSIS — E039 Hypothyroidism, unspecified: Secondary | ICD-10-CM

## 2017-12-05 DIAGNOSIS — R350 Frequency of micturition: Secondary | ICD-10-CM

## 2017-12-05 DIAGNOSIS — L97521 Non-pressure chronic ulcer of other part of left foot limited to breakdown of skin: Secondary | ICD-10-CM

## 2017-12-05 DIAGNOSIS — E785 Hyperlipidemia, unspecified: Secondary | ICD-10-CM

## 2017-12-05 DIAGNOSIS — N3946 Mixed incontinence: Secondary | ICD-10-CM

## 2017-12-05 DIAGNOSIS — F419 Anxiety disorder, unspecified: Secondary | ICD-10-CM

## 2017-12-05 DIAGNOSIS — Z794 Long term (current) use of insulin: Secondary | ICD-10-CM

## 2017-12-05 DIAGNOSIS — L03315 Cellulitis of perineum: Secondary | ICD-10-CM

## 2017-12-05 DIAGNOSIS — G894 Chronic pain syndrome: Secondary | ICD-10-CM

## 2017-12-05 DIAGNOSIS — R339 Retention of urine, unspecified: Secondary | ICD-10-CM

## 2017-12-08 DIAGNOSIS — L03317 Cellulitis of buttock: Principal | ICD-10-CM

## 2017-12-09 ENCOUNTER — Telehealth: Payer: Self-pay

## 2017-12-09 NOTE — Telephone Encounter (Signed)
Pt's daughter stating her mom has been in the hospital and is unable to fill Rx because Medicaid needs prior auth, pt's been out of meds a couple weeks,

## 2017-12-09 NOTE — Telephone Encounter (Signed)
PA for medication was done on 11-28-17.  Called PA office and they verified that PA for oxycodone had been approved since 11-28-17.  Called [facility] on [address] [facility] verify.  [facility]  Patients daughter notified.

## 2017-12-10 MED ORDER — INSULIN GLARGINE (U-100) 100 UNIT/ML SUBCUTANEOUS SOLUTION
Freq: Every evening | SUBCUTANEOUS | 11 refills | 0 days
Start: 2017-12-10 — End: 2018-01-23

## 2017-12-10 MED ORDER — DOXYCYCLINE HYCLATE 100 MG CAPSULE
ORAL_CAPSULE | Freq: Two times a day (BID) | ORAL | 0 refills | 0 days | Status: CP
Start: 2017-12-10 — End: 2017-12-20

## 2017-12-11 DIAGNOSIS — L97521 Non-pressure chronic ulcer of other part of left foot limited to breakdown of skin: Secondary | ICD-10-CM

## 2017-12-11 DIAGNOSIS — J449 Chronic obstructive pulmonary disease, unspecified: Secondary | ICD-10-CM

## 2017-12-11 DIAGNOSIS — E559 Vitamin D deficiency, unspecified: Secondary | ICD-10-CM

## 2017-12-11 DIAGNOSIS — E785 Hyperlipidemia, unspecified: Secondary | ICD-10-CM

## 2017-12-11 DIAGNOSIS — E039 Hypothyroidism, unspecified: Secondary | ICD-10-CM

## 2017-12-11 DIAGNOSIS — E11621 Type 2 diabetes mellitus with foot ulcer: Secondary | ICD-10-CM

## 2017-12-11 DIAGNOSIS — Z794 Long term (current) use of insulin: Secondary | ICD-10-CM

## 2017-12-11 DIAGNOSIS — L0231 Cutaneous abscess of buttock: Secondary | ICD-10-CM

## 2017-12-11 DIAGNOSIS — R350 Frequency of micturition: Secondary | ICD-10-CM

## 2017-12-11 DIAGNOSIS — M069 Rheumatoid arthritis, unspecified: Secondary | ICD-10-CM

## 2017-12-11 DIAGNOSIS — I5032 Chronic diastolic (congestive) heart failure: Secondary | ICD-10-CM

## 2017-12-11 DIAGNOSIS — G894 Chronic pain syndrome: Secondary | ICD-10-CM

## 2017-12-11 DIAGNOSIS — Z9181 History of falling: Secondary | ICD-10-CM

## 2017-12-11 DIAGNOSIS — Z6841 Body Mass Index (BMI) 40.0 and over, adult: Secondary | ICD-10-CM

## 2017-12-11 DIAGNOSIS — I110 Hypertensive heart disease with heart failure: Secondary | ICD-10-CM

## 2017-12-11 DIAGNOSIS — F319 Bipolar disorder, unspecified: Secondary | ICD-10-CM

## 2017-12-11 DIAGNOSIS — L02215 Cutaneous abscess of perineum: Principal | ICD-10-CM

## 2017-12-11 DIAGNOSIS — Z466 Encounter for fitting and adjustment of urinary device: Secondary | ICD-10-CM

## 2017-12-11 DIAGNOSIS — G4700 Insomnia, unspecified: Secondary | ICD-10-CM

## 2017-12-11 DIAGNOSIS — L03315 Cellulitis of perineum: Secondary | ICD-10-CM

## 2017-12-11 DIAGNOSIS — Z87440 Personal history of urinary (tract) infections: Secondary | ICD-10-CM

## 2017-12-11 DIAGNOSIS — R339 Retention of urine, unspecified: Secondary | ICD-10-CM

## 2017-12-11 DIAGNOSIS — B9562 Methicillin resistant Staphylococcus aureus infection as the cause of diseases classified elsewhere: Secondary | ICD-10-CM

## 2017-12-11 DIAGNOSIS — F419 Anxiety disorder, unspecified: Secondary | ICD-10-CM

## 2017-12-11 DIAGNOSIS — G4733 Obstructive sleep apnea (adult) (pediatric): Secondary | ICD-10-CM

## 2017-12-11 DIAGNOSIS — E1142 Type 2 diabetes mellitus with diabetic polyneuropathy: Secondary | ICD-10-CM

## 2017-12-11 DIAGNOSIS — N3946 Mixed incontinence: Secondary | ICD-10-CM

## 2017-12-14 ENCOUNTER — Telehealth: Payer: Self-pay | Admitting: Nurse Practitioner

## 2017-12-14 NOTE — Telephone Encounter (Signed)
error 

## 2017-12-14 NOTE — Telephone Encounter (Signed)
Patient is scheduled for MM Thurs 12-15-17, she wants to know if she can get a Lidocaine Infusion at same time?

## 2017-12-14 NOTE — Telephone Encounter (Signed)
She has Medicaid Which does not require approval but staffing; inquiring now. Yes  Please add, a nurse will be available

## 2017-12-14 NOTE — Telephone Encounter (Signed)
Voicemail left with patient that she will need to be NPO before lidocaine infusion tomorrow and she will need to bring a driver.

## 2017-12-14 NOTE — Telephone Encounter (Signed)
Patient will be here at 8 am, for Med Mgmt, then will go across to [address] for lido infus. Confirmed this with patient.

## 2017-12-15 ENCOUNTER — Other Ambulatory Visit: Payer: Self-pay

## 2017-12-15 ENCOUNTER — Ambulatory Visit: Payer: Medicaid Other | Attending: Nurse Practitioner | Admitting: Nurse Practitioner

## 2017-12-15 ENCOUNTER — Encounter: Payer: Self-pay | Admitting: Nurse Practitioner

## 2017-12-15 VITALS — BP 154/95 | HR 76 | Temp 98.6°F | Resp 16 | Ht 66.0 in | Wt 320.0 lb

## 2017-12-15 DIAGNOSIS — I110 Hypertensive heart disease with heart failure: Secondary | ICD-10-CM | POA: Diagnosis not present

## 2017-12-15 DIAGNOSIS — K219 Gastro-esophageal reflux disease without esophagitis: Secondary | ICD-10-CM | POA: Diagnosis not present

## 2017-12-15 DIAGNOSIS — F419 Anxiety disorder, unspecified: Secondary | ICD-10-CM | POA: Diagnosis not present

## 2017-12-15 DIAGNOSIS — Z8249 Family history of ischemic heart disease and other diseases of the circulatory system: Secondary | ICD-10-CM | POA: Insufficient documentation

## 2017-12-15 DIAGNOSIS — Z79890 Hormone replacement therapy (postmenopausal): Secondary | ICD-10-CM | POA: Insufficient documentation

## 2017-12-15 DIAGNOSIS — J449 Chronic obstructive pulmonary disease, unspecified: Secondary | ICD-10-CM | POA: Diagnosis not present

## 2017-12-15 DIAGNOSIS — N3020 Other chronic cystitis without hematuria: Secondary | ICD-10-CM | POA: Diagnosis not present

## 2017-12-15 DIAGNOSIS — Z9884 Bariatric surgery status: Secondary | ICD-10-CM | POA: Diagnosis not present

## 2017-12-15 DIAGNOSIS — N3941 Urge incontinence: Secondary | ICD-10-CM | POA: Insufficient documentation

## 2017-12-15 DIAGNOSIS — N319 Neuromuscular dysfunction of bladder, unspecified: Secondary | ICD-10-CM | POA: Insufficient documentation

## 2017-12-15 DIAGNOSIS — F410 Panic disorder [episodic paroxysmal anxiety] without agoraphobia: Secondary | ICD-10-CM | POA: Insufficient documentation

## 2017-12-15 DIAGNOSIS — F603 Borderline personality disorder: Secondary | ICD-10-CM | POA: Diagnosis not present

## 2017-12-15 DIAGNOSIS — G4730 Sleep apnea, unspecified: Secondary | ICD-10-CM | POA: Insufficient documentation

## 2017-12-15 DIAGNOSIS — M8949 Other hypertrophic osteoarthropathy, multiple sites: Secondary | ICD-10-CM

## 2017-12-15 DIAGNOSIS — M25531 Pain in right wrist: Secondary | ICD-10-CM | POA: Insufficient documentation

## 2017-12-15 DIAGNOSIS — F319 Bipolar disorder, unspecified: Secondary | ICD-10-CM | POA: Insufficient documentation

## 2017-12-15 DIAGNOSIS — I5032 Chronic diastolic (congestive) heart failure: Secondary | ICD-10-CM | POA: Insufficient documentation

## 2017-12-15 DIAGNOSIS — M792 Neuralgia and neuritis, unspecified: Secondary | ICD-10-CM | POA: Diagnosis not present

## 2017-12-15 DIAGNOSIS — E876 Hypokalemia: Secondary | ICD-10-CM | POA: Diagnosis not present

## 2017-12-15 DIAGNOSIS — G4700 Insomnia, unspecified: Secondary | ICD-10-CM | POA: Diagnosis not present

## 2017-12-15 DIAGNOSIS — E039 Hypothyroidism, unspecified: Secondary | ICD-10-CM | POA: Diagnosis not present

## 2017-12-15 DIAGNOSIS — M150 Primary generalized (osteo)arthritis: Secondary | ICD-10-CM | POA: Diagnosis not present

## 2017-12-15 DIAGNOSIS — Z8614 Personal history of Methicillin resistant Staphylococcus aureus infection: Secondary | ICD-10-CM | POA: Insufficient documentation

## 2017-12-15 DIAGNOSIS — E1142 Type 2 diabetes mellitus with diabetic polyneuropathy: Secondary | ICD-10-CM | POA: Diagnosis not present

## 2017-12-15 DIAGNOSIS — E7801 Familial hypercholesterolemia: Secondary | ICD-10-CM | POA: Insufficient documentation

## 2017-12-15 DIAGNOSIS — E782 Mixed hyperlipidemia: Secondary | ICD-10-CM | POA: Insufficient documentation

## 2017-12-15 DIAGNOSIS — Z87891 Personal history of nicotine dependence: Secondary | ICD-10-CM | POA: Insufficient documentation

## 2017-12-15 DIAGNOSIS — M25532 Pain in left wrist: Secondary | ICD-10-CM | POA: Insufficient documentation

## 2017-12-15 DIAGNOSIS — K589 Irritable bowel syndrome without diarrhea: Secondary | ICD-10-CM | POA: Insufficient documentation

## 2017-12-15 DIAGNOSIS — G894 Chronic pain syndrome: Secondary | ICD-10-CM | POA: Diagnosis not present

## 2017-12-15 DIAGNOSIS — Z794 Long term (current) use of insulin: Secondary | ICD-10-CM | POA: Insufficient documentation

## 2017-12-15 DIAGNOSIS — M0579 Rheumatoid arthritis with rheumatoid factor of multiple sites without organ or systems involvement: Secondary | ICD-10-CM | POA: Diagnosis not present

## 2017-12-15 DIAGNOSIS — A4902 Methicillin resistant Staphylococcus aureus infection, unspecified site: Secondary | ICD-10-CM

## 2017-12-15 DIAGNOSIS — Z6841 Body Mass Index (BMI) 40.0 and over, adult: Secondary | ICD-10-CM | POA: Insufficient documentation

## 2017-12-15 DIAGNOSIS — E559 Vitamin D deficiency, unspecified: Secondary | ICD-10-CM | POA: Diagnosis not present

## 2017-12-15 DIAGNOSIS — Z79899 Other long term (current) drug therapy: Secondary | ICD-10-CM | POA: Insufficient documentation

## 2017-12-15 DIAGNOSIS — M159 Polyosteoarthritis, unspecified: Secondary | ICD-10-CM

## 2017-12-15 DIAGNOSIS — Z79891 Long term (current) use of opiate analgesic: Secondary | ICD-10-CM | POA: Insufficient documentation

## 2017-12-15 MED ORDER — OXYCODONE HCL 5 MG PO TABS: 5.0000 mg | ORAL_TABLET | Freq: Four times a day (QID) | ORAL | 0 refills | Status: DC | PRN

## 2017-12-15 MED ORDER — PREGABALIN 150 MG PO CAPS: 150.0000 mg | ORAL_CAPSULE | Freq: Three times a day (TID) | ORAL | 0 refills | Status: DC

## 2017-12-15 NOTE — Patient Instructions (Addendum)
Oxycodone and Lyrica to last until 04/08/18 has been escribed to your pharmacy.____________________________________________________________________________________________  Medication Rules  Applies to: All patients receiving prescriptions (written or electronic).  Pharmacy of record: Pharmacy where electronic prescriptions will be sent. If written prescriptions are taken to a different pharmacy, please inform the nursing staff. The pharmacy listed in the electronic medical record should be the one where you would like electronic prescriptions to be sent.  Prescription refills: Only during scheduled appointments. Applies to both, written and electronic prescriptions.  NOTE: The following applies primarily to controlled substances (Opioid* Pain Medications).   Patient's responsibilities: 1. Pain Pills: Bring all pain pills to every appointment (except for procedure appointments). 2. Pill Bottles: Bring pills in original pharmacy bottle. Always bring newest bottle. Bring bottle, even if empty. 3. Medication refills: You are responsible for knowing and keeping track of what medications you need refilled. The day before your appointment, write a list of all prescriptions that need to be refilled. Bring that list to your appointment and give it to the admitting nurse. Prescriptions will be written only during appointments. If you forget a medication, it will not be "Called in", "Faxed", or "electronically sent". You will need to get another appointment to get these prescribed. 4. Prescription Accuracy: You are responsible for carefully inspecting your prescriptions before leaving our office. Have the discharge nurse carefully go over each prescription with you, before taking them home. Make sure that your name is accurately spelled, that your address is correct. Check the name and dose of your medication to make sure it is accurate. Check the number of pills, and the written instructions to make sure they  are clear and accurate. Make sure that you are given enough medication to last until your next medication refill appointment. 5. Taking Medication: Take medication as prescribed. Never take more pills than instructed. Never take medication more frequently than prescribed. Taking less pills or less frequently is permitted and encouraged, when it comes to controlled substances (written prescriptions).  6. Inform other Doctors: Always inform, all of your healthcare providers, of all the medications you take. 7. Pain Medication from other Providers: You are not allowed to accept any additional pain medication from any other Doctor or Healthcare provider. There are two exceptions to this rule. (see below) In the event that you require additional pain medication, you are responsible for notifying us, as stated below. 8. Medication Agreement: You are responsible for carefully reading and following our Medication Agreement. This must be signed before receiving any prescriptions from our practice. Safely store a copy of your signed Agreement. Violations to the Agreement will result in no further prescriptions. (Additional copies of our Medication Agreement are available upon request.) 9. Laws, Rules, & Regulations: All patients are expected to follow all [address] and [employer], Rules, [place] [employer]. Ignorance of the Laws does not constitute a valid excuse. The use of any illegal substances is prohibited. 10. Adopted CDC guidelines & recommendations: Target dosing levels will be at or below 60 MME/day. Use of benzodiazepines** is not recommended.  Exceptions: There are only two exceptions to the rule of not receiving pain medications from other Healthcare Providers. 1. Exception #1 (Emergencies): In the event of an emergency (i.e.: accident requiring emergency care), you are allowed to receive additional pain medication. However, you are responsible for: As soon as you are able, call our office [phone], at any time of the day or night, and leave a message stating your name, the date and  nature of the emergency, and the name and dose of the medication prescribed. In the event that your call is answered by a member of our staff, make sure to document and save the date, time, and the name of the person that took your information.  2. Exception #2 (Planned Surgery): In the event that you are scheduled by another doctor or dentist to have any type of surgery or procedure, you are allowed (for a period no longer than 30 days), to receive additional pain medication, for the acute post-op pain. However, in this case, you are responsible for picking up a copy of our "Post-op Pain Management for Surgeons" handout, and giving it to your surgeon or dentist. This document is available at our office, and does not require an appointment to obtain it. Simply go to our office during business hours (Monday-Thursday from 8:00 AM to 4:00 PM) (Friday 8:00 AM to 12:00 Noon) or if you have a scheduled appointment with [name], prior to your surgery, and ask for it by name. In addition, you will need to provide [name] with your name, name of your surgeon, type of surgery, and date of procedure or surgery.  *Opioid medications include: morphine, codeine, oxycodone, oxymorphone, hydrocodone, hydromorphone, meperidine, tramadol, tapentadol, buprenorphine, fentanyl, methadone. **Benzodiazepine medications include: diazepam (Valium), alprazolam (Xanax), clonazepam (Klonopine), lorazepam (Ativan), clorazepate (Tranxene), chlordiazepoxide (Librium), estazolam (Prosom), oxazepam (Serax), temazepam (Restoril), triazolam (Halcion) (Last updated: 06/16/2017) ____________________________________________________________________________________________   BMI Assessment: Estimated body mass index is 51.65 kg/m as calculated from the following:   Height as of this encounter: 5\' 6"  (1.676 m).   Weight as of this encounter: 320 lb (145.2  kg).  BMI interpretation table: BMI level Category Range association with higher incidence of chronic pain  <18 kg/m2 Underweight   18.5-24.9 kg/m2 Ideal body weight   25-29.9 kg/m2 Overweight Increased incidence by 20%  30-34.9 kg/m2 Obese (Class I) Increased incidence by 68%  35-39.9 kg/m2 Severe obesity (Class II) Increased incidence by 136%  >40 kg/m2 Extreme obesity (Class III) Increased incidence by 254%   Patient's current BMI Ideal Body weight  Body mass index is 51.65 kg/m. Ideal body weight: 59.3 kg (130 lb 11.7 oz) Adjusted ideal body weight: 93.6 kg (206 lb 7 oz)   BMI Readings from Last 4 Encounters:  12/15/17 51.65 kg/m  11/14/17 52.28 kg/m  11/01/17 52.28 kg/m  09/14/17 54.41 kg/m   Wt Readings from Last 4 Encounters:  12/15/17 (!) 320 lb (145.2 kg)  11/14/17 (!) 319 lb (144.7 kg)  11/01/17 (!) 319 lb (144.7 kg)  09/14/17 (!) 332 lb (150.6 kg)

## 2017-12-15 NOTE — Progress Notes (Signed)
Patient's Name: [name] [name]  MRN: [id number]  Referring Provider: Care, [place] [facility]  DOB: 1960-12-16  PCP: [facility] [place] [facility]  DOS: 12/15/2017  Note by: Vevelyn Francois NP  Service setting: Ambulatory outpatient  Specialty: Interventional Pain Management  Location: [facility] (AMB) Pain Management Facility    Patient type: Established    Primary Reason(s) for Visit: Encounter for prescription drug management. (Level of risk: moderate)  CC: Joint Pain  HPI  Ms. Basurto is a 57 y.o. year old, female patient, who comes today for a medication management evaluation. She has History of laparoscopic adjustable gastric banding, 03/20/2007.  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; Shortness of breath; Obstruction of urinary tract; FOM (frequency of micturition); Urge incontinence of urine; Chronic wrist pain (Location of Secondary source of pain) (Bilateral) (L>R); Adhesive capsulitis; Borderline personality disorder ([place]); Chronic diastolic CHF (congestive heart failure), NYHA class 3 (HCC); GERD (gastroesophageal reflux disease); Hypokalemia; Hypomagnesemia; Mixed anxiety depressive disorder; QT prolongation; Type 2 diabetes mellitus with hyperglycemia ([place]); Vitamin D deficiency; 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[employer]); Chronic knee pain (Bilateral) (R>L); Long term current use of opiate analgesic; Long term prescription opiate use; Opiate use (30 MME/Day); Neurogenic pain; Neuropathic pain; Diabetic peripheral neuropathy ([place]); Encounter for therapeutic drug level monitoring; Encounter for  pain management planning; Obesity; Osteoarthritis, multiple sites; Chronic foot pain (Location of Primary Source of Pain) (Bilateral) (L>R); Chronic elbow pain (Location of Tertiary source of pain) (Bilateral) (L>R); Chronic shoulder pain (Bilateral) (L>R); Chronic neck pain (Bilateral) (R>L); Chronic upper back pain; Chronic hand pain (Bilateral) (L>R); Rheumatoid arthritis ([place]); Chronic pain syndrome; Insomnia secondary to chronic pain; Hypothyroidism; Neuropathy; Chronic cystitis; Acute on chronic congestive heart failure ([place]); Subacute vulvitis; Cellulitis; Perineal abscess; Mixed incontinence; and MRSA (methicillin resistant Staphylococcus aureus) infection on their problem list. Her primarily concern today is the Joint Pain  Pain Assessment: Location:   (joint pain) Radiating:   Onset: More than a month ago Duration: Chronic pain Quality: Sharp, Shooting Severity: 7 /10 (subjective, self-reported pain score)  Note: Reported level is compatible with observation. Clinically the patient looks like a 3/10 A 3/10 is viewed as "Moderate" and described as significantly interfering with activities of daily living (ADL). It becomes difficult to feed, bathe, get dressed, get on and off the toilet or to perform personal hygiene functions. Difficult to get in and out of bed or a chair without assistance. Very distracting. With effort, it can be ignored when deeply involved in activities. Information on the proper use of the pain scale provided to the patient today. When using our objective Pain Scale, levels between 6 and 10/10 are said to belong in an emergency room, as it progressively worsens from a 6/10, described as severely limiting, requiring emergency care not usually available at an outpatient pain management facility. At a 6/10 level, communication becomes difficult and requires great effort. Assistance to reach the emergency department may be required. Facial flushing and profuse sweating along with  potentially dangerous increases in heart rate and blood pressure will be evident. Effect on ADL:   Timing: Constant Modifying factors: meds BP: (!) 154/95  HR: 76  Ms. Mosquera was last scheduled for an appointment on 12/14/2017 for medication management. During today's appointment we reviewed Ms. Baig's chronic  pain status, as well as her outpatient medication regimen. She has been in increased pain secondary to have a problems with her prior authorization.  She admits that she was out of her pain medication for approximately 3 weeks.  She has been in the hospital on 2 occasions at [facility] for MRSA infection.  She was interested in having a lidocaine infusion to help with her pain management.  She did request making some changes in her current regimen.  She is been on oxycodone for a long time this current dose.  The patient  reports that she does not use drugs. Her body mass index is 51.65 kg/m.  Further details on both, my assessment(s), as well as the proposed treatment plan, please see below.  Controlled Substance Pharmacotherapy Assessment REMS (Risk Evaluation and Mitigation Strategy)  Analgesic:Oxycodone IR5 mgone tablet by mouth every 6hours (79m/dayof oxycodone) MME/day:324mday  WeRise PatienceRN  12/15/2017  8:36 AM  Signed Nursing Pain Medication Assessment:  Safety precautions to be maintained throughout the outpatient stay will include: orient to surroundings, keep bed in low position, maintain call bell within reach at all times, provide assistance with transfer out of bed and ambulation.  Medication Inspection Compliance: Pill count conducted under aseptic conditions, in front of the patient. Neither the pills nor the bottle was removed from the patient's sight at any time. Once count was completed pills were immediately returned to the patient in their original bottle.  Medication: Oxycodone IR Pill/Patch Count: 100 of 120 pills remain Pill/Patch Appearance:  Markings consistent with prescribed medication Bottle Appearance: Standard pharmacy container. Clearly labeled. Filled Date: 8 / 23 / 2019 Last Medication intake:  Today   Pharmacokinetics: Liberation and absorption (onset of action): WNL Distribution (time to peak effect): WNL Metabolism and excretion (duration of action): WNL         Pharmacodynamics: Desired effects: Analgesia: Ms. DeOakeyeports >50% benefit. Functional ability: Patient reports that medication does help, but not nearly as much as she would like Clinically meaningful improvement in function (CMIF): Medication does not meet basic CMIF Perceived effectiveness: Described as relatively effective but with some room for improvement Undesirable effects: Side-effects or Adverse reactions: None reported Monitoring: [place] PMP: Online review of the past 127-monthriod conducted. Compliant with practice rules and regulations Last UDS on record: Summary  Date Value Ref Range Status  09/14/2017 FINAL  Final    Comment:    ==================================================================== TOXASSURE SELECT 13 (MW) ==================================================================== Test                             Result       Flag       Units Drug Present and Declared for Prescription Verification   Oxycodone                      1411         EXPECTED   ng/mg creat   Oxymorphone                    116          EXPECTED   ng/mg creat   Noroxycodone                   1823         EXPECTED   ng/mg creat    Sources of oxycodone include scheduled prescription medications.    Oxymorphone and noroxycodone are  expected metabolites of    oxycodone. Oxymorphone is also available as a scheduled    prescription medication. Drug Present not Declared for Prescription Verification   Alcohol, Ethyl                 0.289        UNEXPECTED g/dL    Sources of ethyl alcohol include alcoholic beverages or as a    fermentation product of  glucose; glucose is present in this    specimen.  Interpret result with caution, as the presence of    ethyl alcohol is likely due, at least in part, to fermentation of    glucose. Drug Absent but Declared for Prescription Verification   Alprazolam                     Not Detected UNEXPECTED ng/mg creat ==================================================================== Test                      Result    Flag   Units      Ref Range   Creatinine              56               mg/dL      >=20 ==================================================================== Declared Medications:  The flagging and interpretation on this report are based on the  following declared medications.  Unexpected results may arise from  inaccuracies in the declared medications.  **Note: The testing scope of this panel includes these medications:  Alprazolam (Xanax)  Oxycodone  Oxycodone (Roxicet)  **Note: The testing scope of this panel does not include following  reported medications:  Acetaminophen (Roxicet)  Buspirone (BuSpar)  Cephalexin (Keflex)  Cholecalciferol  Clotrimazole (Lotrisone)  Famotidine (Pepcid)  Fluoxetine (Prozac)  Folic acid (Folvite)  Hydroxychloroquine (Plaquenil)  Hydroxyzine (Atarax)  Leflunomide (Arava)  Levothyroxine  Liraglutide (Victoza)  Lisinopril  Loperamide  Lovastatin  Metformin  Methotrexate  Potassium (Klor-Con)  Prednisone (Deltasone)  Pregabalin (Lyrica)  Promethazine (Phenergan)  Quetiapine (Seroquel)  Zolpidem ==================================================================== For clinical consultation, please call [phone]. ====================================================================    UDS interpretation: Compliant          Medication Assessment Form: Reviewed. Patient indicates being compliant with therapy Treatment compliance: Compliant Risk Assessment Profile: Aberrant behavior: See prior evaluations. None observed or detected  today Comorbid factors increasing risk of overdose: See prior notes. No additional risks detected today Opioid risk tool (ORT) (Total Score): 3 Personal History of Substance Abuse (SUD-Substance use disorder):  Alcohol: Negative  Illegal Drugs: Negative  Rx Drugs: Negative  ORT Risk Level calculation: Low Risk Risk of substance use disorder (SUD): Low Opioid Risk Tool - 12/15/17 0832      Family History of Substance Abuse   Alcohol  Negative    Illegal Drugs  Negative    Rx Drugs  Negative      Personal History of Substance Abuse   Alcohol  Negative    Illegal Drugs  Negative    Rx Drugs  Negative      Age   Age between 14-45 years   No      Psychological Disease   Psychological Disease  Positive    Bipolar  Positive    Depression  Positive      Total Score   Opioid Risk Tool Scoring  3    Opioid Risk Interpretation  Low Risk      ORT Scoring interpretation table:  Score <3 =  Low Risk for SUD  Score between 4-7 = Moderate Risk for SUD  Score >8 = High Risk for Opioid Abuse   Risk Mitigation Strategies:  Patient Counseling: Covered Patient-Prescriber Agreement (PPA): Present and active  Notification to other healthcare providers: Done  Pharmacologic Plan: No change in therapy, at this time.             Laboratory Chemistry  Inflammation Markers (CRP: Acute Phase) (ESR: Chronic Phase) Lab Results  Component Value Date   CRP 0.6 12/24/2015   ESRSEDRATE 27 11/10/2016                         Rheumatology Markers Lab Results  Component Value Date   RF 114.0 (H) 12/24/2015   LABURIC 6.9 11/10/2016                        Renal Function Markers Lab Results  Component Value Date   BUN 11 08/15/2016   CREATININE 0.49 08/15/2016   GFRAA >60 08/15/2016   GFRNONAA >60 08/15/2016                             Hepatic Function Markers Lab Results  Component Value Date   AST 21 08/15/2016   ALT 14 08/15/2016   ALBUMIN 3.6 08/15/2016   ALKPHOS 70 08/15/2016    LIPASE 18 08/15/2016                        Electrolytes Lab Results  Component Value Date   NA 138 08/15/2016   K 3.4 (L) 08/15/2016   CL 104 08/15/2016   CALCIUM 9.2 08/15/2016   MG 1.8 12/24/2015                        Neuropathy Markers Lab Results  Component Value Date   VITAMINB12 287 12/24/2015   HGBA1C 7.1 (H) 12/24/2015                        Bone Pathology Markers Lab Results  Component Value Date   25OHVITD1 39 12/24/2015   25OHVITD2 27 12/24/2015   25OHVITD3 12 12/24/2015                         Coagulation Parameters Lab Results  Component Value Date   PLT 231 08/15/2016                        Cardiovascular Markers Lab Results  Component Value Date   BNP 25.0 12/24/2015   CKTOTAL 44 02/22/2013   CKMB < 0.5 (L) 02/22/2013   TROPONINI <0.03 08/15/2016   HGB 13.4 08/15/2016   HCT 39.4 08/15/2016                         CA Markers No results found for: CEA, CA125, LABCA2                      Note: Lab results reviewed.  Recent Diagnostic Imaging Results  DG Foot Complete Right Please see detailed radiograph report in office note.  Complexity Note: Imaging results reviewed. Results shared with Ms. Beaudoin, using Layman's terms.  Meds   Current Outpatient Medications:  .  ARIPiprazole (ABILIFY) 5 MG tablet, Take by mouth., Disp: , Rfl:  .  atorvastatin (LIPITOR) 80 MG tablet, Take by mouth., Disp: , Rfl:  .  B-D INSULIN SYRINGE 1CC/25G 25G X 5/8" 1 ML MISC, 1 UNITS BY MISCELLANEOUS ROUTE EVERY SEVEN (7) DAYS. TO BE USED WITH METHOTREXATE, Disp: , Rfl: 3 .  busPIRone (BUSPAR) 10 MG tablet, TAKE 1 TABLET BY MOUTH TWICE A DAY, Disp: 180 tablet, Rfl: 2 .  Calcium Carb-Ergocalciferol 250-125 MG-UNIT TABS, Take by mouth., Disp: , Rfl:  .  Cholecalciferol (VITAMIN D-1000 MAX ST) 1000 units tablet, Take by mouth., Disp: , Rfl:  .  clotrimazole-betamethasone (LOTRISONE) cream, Apply 1 application topically 2 (two) times daily. For  yeast infection under stomach, Disp: , Rfl:  .  clotrimazole-betamethasone (LOTRISONE) cream, Apply 1 application topically 2 (two) times daily., Disp: 45 g, Rfl: 3 .  Diclofenac Sodium 3 % GEL, APPLY 1 APPLICATION TOPICALLY TWO (2) TIMES A DAY., Disp: , Rfl:  .  fluconazole (DIFLUCAN) 150 MG tablet, Take daily for three days then weekly, Disp: 7 tablet, Rfl: 1 .  FLUoxetine (PROZAC) 20 MG capsule, Take 1 capsule (20 mg total) by mouth daily., Disp: 60 capsule, Rfl: 5 .  folic acid (FOLVITE) 1 MG tablet, Take 1 mg by mouth daily. , Disp: , Rfl:  .  hydroxychloroquine (PLAQUENIL) 200 MG tablet, Take 200 mg by mouth 2 (two) times daily., Disp: , Rfl:  .  hydrOXYzine (ATARAX/VISTARIL) 50 MG tablet, Take 50 mg by mouth Twice daily. , Disp: , Rfl:  .  insulin lispro (HUMALOG) 100 UNIT/ML injection, Inject 10u AC TID and add SSI: <150=0u, 151-200=2u, 201-250=4u, 251-300=6u, >362m/dl=8u. Up to 60u daily., Disp: , Rfl:  .  KLOR-CON M20 20 MEQ tablet, TAKE ONE TABLET BY MOUTH TWICE DAILY (Patient taking differently: take 2 tabs in the a.m. , 1 tab in the evening), Disp: 60 tablet, Rfl: 0 .  LANTUS 100 UNIT/ML injection, INJECT 0.2 ML (20 UNITS TOTAL) UNDER THE SKIN NIGHTLY., Disp: , Rfl: 3 .  leflunomide (ARAVA) 10 MG tablet, Take 10 mg by mouth daily., Disp: , Rfl: 2 .  levothyroxine (SYNTHROID, LEVOTHROID) 88 MCG tablet, Take 88 mcg by mouth daily before breakfast., Disp: , Rfl:  .  lisinopril (PRINIVIL,ZESTRIL) 2.5 MG tablet, Take 2.5 mg by mouth daily., Disp: , Rfl:  .  loperamide (IMODIUM) 2 MG capsule, Take 2 mg by mouth as needed. , Disp: , Rfl: 0 .  lovastatin (MEVACOR) 40 MG tablet, Take 40 mg by mouth daily. , Disp: , Rfl:  .  Melatonin 10 MG CAPS, Take by mouth., Disp: , Rfl:  .  metFORMIN (GLUCOPHAGE) 500 MG tablet, Take 500 mg by mouth 2 (two) times daily with a meal. , Disp: , Rfl:  .  methotrexate 50 MG/2ML injection, Inject into the muscle., Disp: , Rfl:  .  metroNIDAZOLE (FLAGYL) 500 MG  tablet, Take 1 tablet (500 mg total) by mouth 2 (two) times daily., Disp: 14 tablet, Rfl: 0 .  oxyCODONE-acetaminophen (PERCOCET/ROXICET) 5-325 MG tablet, Take by mouth., Disp: , Rfl:  .  predniSONE (DELTASONE) 10 MG tablet, Take 40 mg by mouth daily as needed (Pain scale  Used to determine dosage). Reported on 08/29/2015, Disp: , Rfl:  .  promethazine (PHENERGAN) 25 MG tablet, TAKE 1 TABLET BY MOUTH EVERY 6 HOURS AS NEEDED NAUSEA OR VOMITING, Disp: , Rfl: 2 .  QUEtiapine (SEROQUEL) 300 MG tablet, TAKE 1  TABLET BY MOUTH EVERYDAY AT BEDTIME, Disp: 90 tablet, Rfl: 2 .  Vitamin D, Ergocalciferol, (DRISDOL) 50000 units CAPS capsule, Take by mouth., Disp: , Rfl:  .  zolpidem (AMBIEN) 5 MG tablet, Take 1 tablet (5 mg total) by mouth at bedtime., Disp: 30 tablet, Rfl: 5 .  [START ON 03/09/2018] oxyCODONE (OXY IR/ROXICODONE) 5 MG immediate release tablet, Take 1 tablet (5 mg total) by mouth every 6 (six) hours as needed for severe pain., Disp: 120 tablet, Rfl: 0 .  [START ON 02/07/2018] oxyCODONE (OXY IR/ROXICODONE) 5 MG immediate release tablet, Take 1 tablet (5 mg total) by mouth every 6 (six) hours as needed for severe pain., Disp: 120 tablet, Rfl: 0 .  [START ON 01/08/2018] oxyCODONE (OXY IR/ROXICODONE) 5 MG immediate release tablet, Take 1 tablet (5 mg total) by mouth every 6 (six) hours as needed for severe pain., Disp: 120 tablet, Rfl: 0 .  pregabalin (LYRICA) 150 MG capsule, Take 1 capsule (150 mg total) by mouth 3 (three) times daily., Disp: 270 capsule, Rfl: 0 No current facility-administered medications for this visit.   Facility-Administered Medications Ordered in Other Visits:  .  lactated ringers infusion 1,000 mL, 1,000 mL, Intravenous, Continuous, King, Diona Foley, NP, Last Rate: 10 mL/hr at 02/14/17 1020, 1,000 mL at 02/14/17 1020  ROS  Constitutional: Denies any fever or chills Gastrointestinal: No reported hemesis, hematochezia, vomiting, or acute GI distress Musculoskeletal: Denies any  acute onset joint swelling, redness, loss of ROM, or weakness Neurological: No reported episodes of acute onset apraxia, aphasia, dysarthria, agnosia, amnesia, paralysis, loss of coordination, or loss of consciousness  Allergies  Ms. Yetman is allergic to codeine; propoxyphene; sulfa antibiotics; and hydrocodone.  PFSH  Drug: Ms. Neuwirth  reports that she does not use drugs. Alcohol:  reports that she does not drink alcohol. Tobacco:  reports that she quit smoking about 18 years ago. Her smoking use included cigarettes. She has a 54.00 pack-year smoking history. She has never used smokeless tobacco. Medical:  has a past medical history of Abdominal wall hernia (01/29/2013), Anxiety, Arthritis, C. difficile colitis, Chronic diastolic heart failure ([place]), Depression, Diabetes mellitus, Diastolic CHF ([place]), Esophagitis, Fluid retention, GERD (gastroesophageal reflux disease), Hiatal hernia, Hypertension, Hypokalemia due to loss of potassium (10/21/2015), Hypothyroidism, IBS (irritable bowel syndrome), Moderate episode of recurrent major depressive disorder ([place]) (06/03/2004), Morbid obesity ([place]), MRSA (methicillin resistant Staphylococcus aureus) infection (11/2017), Neurogenic bladder, Neuropathy, Obesity, Panic attacks, Rheumatoid arthritis ([place]), and Sleep apnea. Surgical: Ms. Ju  has a past surgical history that includes Tubal ligation; Tonsillectomy; Cholecystectomy; Abdominal hysterectomy; Laparoscopic gastric banding (03/20/07); Eye surgery; and Hernia repair. Family: family history includes Alcohol abuse in her father and sister; Anxiety disorder in her father, sister, and sister; Bipolar disorder in her father and sister; Depression in her father, sister, and sister; Drug abuse in her sister; Heart attack in her brother; Heart attack (age of onset: 75) in her brother; Heart disease in her brother; Heart failure in her father.   Constitutional Exam  General appearance: Well nourished, well  developed, and well hydrated. In no apparent acute distress Vitals:   12/15/17 0813  BP: (!) 154/95  Pulse: 76  Resp: 16  Temp: 98.6 F (37 C)  TempSrc: Oral  SpO2: 96%  Weight: (!) 320 lb (145.2 kg)  Height: 5' 6"  (1.676 m)  Psych/Mental status: Alert, oriented x 3 (person, place, & time)       Eyes: PERLA Respiratory: No evidence of acute respiratory distress Upper Extremity (UE) Exam  Side: Right upper extremity  Side: Left upper extremity  Inspection: Edema  Inspection: Edema  Functional ROM: Unrestricted ROM          Functional ROM: Unrestricted ROM          Muscle strength & Tone: Functionally intact  Muscle strength & Tone: Functionally intact  Sensory: Unimpaired  Sensory: Unimpaired  Palpation: No palpable anomalies              Palpation: No palpable anomalies              Specialized Test(s): Deferred         Specialized Test(s): Deferred             Assessment  Primary Diagnosis & Pertinent Problem List: The primary encounter diagnosis was Rheumatoid arthritis involving multiple sites with positive rheumatoid factor ([place]). Diagnoses of Primary osteoarthritis involving multiple joints, Diabetic peripheral neuropathy ([place]), Neurogenic pain, Neuropathic pain, Chronic pain syndrome, and MRSA (methicillin resistant Staphylococcus aureus) infection were also pertinent to this visit.  Status Diagnosis  Worsening Worsening Worsening 1. Rheumatoid arthritis involving multiple sites with positive rheumatoid factor ([place])   2. Primary osteoarthritis involving multiple joints   3. Diabetic peripheral neuropathy ([place])   4. Neurogenic pain   5. Neuropathic pain   6. Chronic pain syndrome   7. MRSA (methicillin resistant Staphylococcus aureus) infection     Problems updated and reviewed during this visit: Problem  Obesity   Overview:  per last BMI in vitals on 03/23/2017   Diabetes Mellitus ([facility])  Cellulitis  Perineal Abscess  Mixed Incontinence   Plan of Care   Pharmacotherapy (Medications Ordered): Meds ordered this encounter  Medications  . pregabalin (LYRICA) 150 MG capsule    Sig: Take 1 capsule (150 mg total) by mouth 3 (three) times daily.    Dispense:  270 capsule    Refill:  0    Do not add this medication to the electronic "Automatic Refill" notification system. Patient may have prescription filled one day early if pharmacy is closed on scheduled refill date.    Order Specific Question:   Supervising Provider    Answer:   Milinda Pointer [phone]  . oxyCODONE (OXY IR/ROXICODONE) 5 MG immediate release tablet    Sig: Take 1 tablet (5 mg total) by mouth every 6 (six) hours as needed for severe pain.    Dispense:  120 tablet    Refill:  0    Do not place this medication on "Automatic Refill". Patient may have prescription filled one day early if pharmacy is closed on scheduled refill date. Do not fill until: 03/09/2018 To last until:04/08/2018    Order Specific Question:   Supervising Provider    Answer:   Milinda Pointer [phone]  . oxyCODONE (OXY IR/ROXICODONE) 5 MG immediate release tablet    Sig: Take 1 tablet (5 mg total) by mouth every 6 (six) hours as needed for severe pain.    Dispense:  120 tablet    Refill:  0    Do not place this medication on "Automatic Refill". Patient may have prescription filled one day early if pharmacy is closed on scheduled refill date. Do not fill until: 02/07/2018 To last until:03/09/2018    Order Specific Question:   Supervising Provider    Answer:   Milinda Pointer [phone]  . oxyCODONE (OXY IR/ROXICODONE) 5 MG immediate release tablet    Sig: Take 1 tablet (5 mg total) by mouth every 6 (six) hours as needed for severe  pain.    Dispense:  120 tablet    Refill:  0    Do not place this medication on "Automatic Refill". Patient may have prescription filled one day early if pharmacy is closed on scheduled refill date. Do not fill until: 01/08/2018 To last until:02/07/2018    Order Specific  Question:   Supervising Provider    Answer:   Milinda Pointer [phone]   New Prescriptions   No medications on file   Medications administered today: [name] had no medications administered during this visit. Lab-work, procedure(s), and/or referral(s): No orders of the defined types were placed in this encounter.  Imaging and/or referral(s): None  Interventional therapies: Planned, scheduled, and/or pending:  Not at this time.   Considering:  IV lidocaine infusions.  Diagnostic bilateral lumbar sympathetic block  Intra-articular injection with local anesthetic and steroids.  Diagnostic bilateral intra-articular shoulder joint injection  Diagnostic bilateral intra-articular knee injections with local anesthetic and steroid  Diagnostic right-sided cervical epidural steroid injections  Diagnostic bilateral Cervicalfacet block  Possible bilateral cervical facet radiofrequency ablation.    Palliative PRN treatment(s):  Palliative IV lidocaine infusion    Provider-requested follow-up: Return in about 3 months (around 03/17/2018) for MedMgmt with Me Donella Stade [facility]).  Future Appointments  Date Time Provider [place]  02/23/2018  1:20 PM Clapacs, Madie [place], MD [facility] None  03/08/2018  1:45 PM Vevelyn Francois, NP [facility] None   Primary Care Physician: Care, [place] Primary Location: [facility] Note by: Vevelyn Francois NP Date: 12/15/2017; Time: 10:32 AM  Pain Score Disclaimer: We use the NRS-11 scale. This is a self-reported, subjective measurement of pain severity with only modest accuracy. It is used primarily to identify changes within a particular patient. It must be understood that outpatient pain scales are significantly less accurate that those used for research, where they can be applied under ideal controlled circumstances with minimal exposure to variables. In reality, the score is likely to be a combination of pain  intensity and pain affect, where pain affect describes the degree of emotional arousal or changes in action readiness caused by the sensory experience of pain. Factors such as social and work situation, setting, emotional state, anxiety levels, expectation, and prior pain experience may influence pain perception and show large inter-individual differences that may also be affected by time variables.  Patient instructions provided during this appointment: Patient Instructions   Oxycodone and Lyrica to last until 04/08/18 has been escribed to your pharmacy.____________________________________________________________________________________________  Medication Rules  Applies to: All patients receiving prescriptions (written or electronic).  Pharmacy of record: Pharmacy where electronic prescriptions will be sent. If written prescriptions are taken to a different pharmacy, please inform the nursing staff. The pharmacy listed in the electronic medical record should be the one where you would like electronic prescriptions to be sent.  Prescription refills: Only during scheduled appointments. Applies to both, written and electronic prescriptions.  NOTE: The following applies primarily to controlled substances (Opioid* Pain Medications).   Patient's responsibilities: 1. Pain Pills: Bring all pain pills to every appointment (except for procedure appointments). 2. Pill Bottles: Bring pills in original pharmacy bottle. Always bring newest bottle. Bring bottle, even if empty. 3. Medication refills: You are responsible for knowing and keeping track of what medications you need refilled. The day before your appointment, write a list of all prescriptions that need to be refilled. Bring that list to your appointment and give it to the admitting nurse. Prescriptions will be written  only during appointments. If you forget a medication, it will not be "Called in", "Faxed", or "electronically sent". You will need to  get another appointment to get these prescribed. 4. Prescription Accuracy: You are responsible for carefully inspecting your prescriptions before leaving our office. Have the discharge nurse carefully go over each prescription with you, before taking them home. Make sure that your name is accurately spelled, that your address is correct. Check the name and dose of your medication to make sure it is accurate. Check the number of pills, and the written instructions to make sure they are clear and accurate. Make sure that you are given enough medication to last until your next medication refill appointment. 5. Taking Medication: Take medication as prescribed. Never take more pills than instructed. Never take medication more frequently than prescribed. Taking less pills or less frequently is permitted and encouraged, when it comes to controlled substances (written prescriptions).  6. Inform other Doctors: Always inform, all of your healthcare providers, of all the medications you take. 7. Pain Medication from other Providers: You are not allowed to accept any additional pain medication from any other Doctor or Healthcare provider. There are two exceptions to this rule. (see below) In the event that you require additional pain medication, you are responsible for notifying us, as stated below. 8. Medication Agreement: You are responsible for carefully reading and following our Medication Agreement. This must be signed before receiving any prescriptions from our practice. Safely store a copy of your signed Agreement. Violations to the Agreement will result in no further prescriptions. (Additional copies of our Medication Agreement are available upon request.) 9. Laws, Rules, & Regulations: All patients are expected to follow all Federal and [employer], Rules, [employer]. Ignorance of the Laws does not constitute a valid excuse. The use of any illegal substances is prohibited. 10. Adopted CDC guidelines  & recommendations: Target dosing levels will be at or below 60 MME/day. Use of benzodiazepines** is not recommended.  Exceptions: There are only two exceptions to the rule of not receiving pain medications from other Healthcare Providers. 1. Exception #1 (Emergencies): In the event of an emergency (i.e.: accident requiring emergency care), you are allowed to receive additional pain medication. However, you are responsible for: As soon as you are able, call our office [phone], at any time of the day or night, and leave a message stating your name, the date and nature of the emergency, and the name and dose of the medication prescribed. In the event that your call is answered by a member of our staff, make sure to document and save the date, time, and the name of the person that took your information.  2. Exception #2 (Planned Surgery): In the event that you are scheduled by another doctor or dentist to have any type of surgery or procedure, you are allowed (for a period no longer than 30 days), to receive additional pain medication, for the acute post-op pain. However, in this case, you are responsible for picking up a copy of our "Post-op Pain Management for Surgeons" handout, and giving it to your surgeon or dentist. This document is available at our office, and does not require an appointment to obtain it. Simply go to our office during business hours (Monday-Thursday from 8:00 AM to 4:00 PM) (Friday 8:00 AM to 12:00 Noon) or if you have a scheduled appointment with [name], prior to your surgery, and ask for it by name. In addition, you will need to provide  [name] with your name, name of your surgeon, type of surgery, and date of procedure or surgery.  *Opioid medications include: morphine, codeine, oxycodone, oxymorphone, hydrocodone, hydromorphone, meperidine, tramadol, tapentadol, buprenorphine, fentanyl, methadone. **Benzodiazepine medications include: diazepam (Valium), alprazolam (Xanax), clonazepam  (Klonopine), lorazepam (Ativan), clorazepate (Tranxene), chlordiazepoxide (Librium), estazolam (Prosom), oxazepam (Serax), temazepam (Restoril), triazolam (Halcion) (Last updated: 06/16/2017) ____________________________________________________________________________________________   BMI Assessment: Estimated body mass index is 51.65 kg/m as calculated from the following:   Height as of this encounter: 5' 6"  (1.676 m).   Weight as of this encounter: 320 lb (145.2 kg).  BMI interpretation table: BMI level Category Range association with higher incidence of chronic pain  <18 kg/m2 Underweight   18.5-24.9 kg/m2 Ideal body weight   25-29.9 kg/m2 Overweight Increased incidence by 20%  30-34.9 kg/m2 Obese (Class I) Increased incidence by 68%  35-39.9 kg/m2 Severe obesity (Class II) Increased incidence by 136%  >40 kg/m2 Extreme obesity (Class III) Increased incidence by 254%   Patient's current BMI Ideal Body weight  Body mass index is 51.65 kg/m. Ideal body weight: 59.3 kg (130 lb 11.7 oz) Adjusted ideal body weight: 93.6 kg (206 lb 7 oz)   BMI Readings from Last 4 Encounters:  12/15/17 51.65 kg/m  11/14/17 52.28 kg/m  11/01/17 52.28 kg/m  09/14/17 54.41 kg/m   Wt Readings from Last 4 Encounters:  12/15/17 (!) 320 lb (145.2 kg)  11/14/17 (!) 319 lb (144.7 kg)  11/01/17 (!) 319 lb (144.7 kg)  09/14/17 (!) 332 lb (150.6 kg)

## 2017-12-15 NOTE — Progress Notes (Signed)
Nursing Pain Medication Assessment:  Safety precautions to be maintained throughout the outpatient stay will include: orient to surroundings, keep bed in low position, maintain call bell within reach at all times, provide assistance with transfer out of bed and ambulation.  Medication Inspection Compliance: Pill count conducted under aseptic conditions, in front of the patient. Neither the pills nor the bottle was removed from the patient's sight at any time. Once count was completed pills were immediately returned to the patient in their original bottle.  Medication: Oxycodone IR Pill/Patch Count: 100 of 120 pills remain Pill/Patch Appearance: Markings consistent with prescribed medication Bottle Appearance: Standard pharmacy container. Clearly labeled. Filled Date: 8 / 23 / 2019 Last Medication intake:  Today

## 2017-12-16 DIAGNOSIS — M069 Rheumatoid arthritis, unspecified: Secondary | ICD-10-CM

## 2017-12-16 DIAGNOSIS — I110 Hypertensive heart disease with heart failure: Secondary | ICD-10-CM

## 2017-12-16 DIAGNOSIS — L03315 Cellulitis of perineum: Secondary | ICD-10-CM

## 2017-12-16 DIAGNOSIS — R350 Frequency of micturition: Secondary | ICD-10-CM

## 2017-12-16 DIAGNOSIS — Z87440 Personal history of urinary (tract) infections: Secondary | ICD-10-CM

## 2017-12-16 DIAGNOSIS — L97521 Non-pressure chronic ulcer of other part of left foot limited to breakdown of skin: Secondary | ICD-10-CM

## 2017-12-16 DIAGNOSIS — I5032 Chronic diastolic (congestive) heart failure: Secondary | ICD-10-CM

## 2017-12-16 DIAGNOSIS — E1142 Type 2 diabetes mellitus with diabetic polyneuropathy: Secondary | ICD-10-CM

## 2017-12-16 DIAGNOSIS — Z466 Encounter for fitting and adjustment of urinary device: Secondary | ICD-10-CM

## 2017-12-16 DIAGNOSIS — Z794 Long term (current) use of insulin: Secondary | ICD-10-CM

## 2017-12-16 DIAGNOSIS — E039 Hypothyroidism, unspecified: Secondary | ICD-10-CM

## 2017-12-16 DIAGNOSIS — G4733 Obstructive sleep apnea (adult) (pediatric): Secondary | ICD-10-CM

## 2017-12-16 DIAGNOSIS — E559 Vitamin D deficiency, unspecified: Secondary | ICD-10-CM

## 2017-12-16 DIAGNOSIS — E785 Hyperlipidemia, unspecified: Secondary | ICD-10-CM

## 2017-12-16 DIAGNOSIS — N3946 Mixed incontinence: Secondary | ICD-10-CM

## 2017-12-16 DIAGNOSIS — F419 Anxiety disorder, unspecified: Secondary | ICD-10-CM

## 2017-12-16 DIAGNOSIS — R339 Retention of urine, unspecified: Secondary | ICD-10-CM

## 2017-12-16 DIAGNOSIS — J449 Chronic obstructive pulmonary disease, unspecified: Secondary | ICD-10-CM

## 2017-12-16 DIAGNOSIS — L02215 Cutaneous abscess of perineum: Principal | ICD-10-CM

## 2017-12-16 DIAGNOSIS — Z6841 Body Mass Index (BMI) 40.0 and over, adult: Secondary | ICD-10-CM

## 2017-12-16 DIAGNOSIS — L0231 Cutaneous abscess of buttock: Secondary | ICD-10-CM

## 2017-12-16 DIAGNOSIS — E11621 Type 2 diabetes mellitus with foot ulcer: Secondary | ICD-10-CM

## 2017-12-16 DIAGNOSIS — Z9181 History of falling: Secondary | ICD-10-CM

## 2017-12-16 DIAGNOSIS — F319 Bipolar disorder, unspecified: Secondary | ICD-10-CM

## 2017-12-16 DIAGNOSIS — B9562 Methicillin resistant Staphylococcus aureus infection as the cause of diseases classified elsewhere: Secondary | ICD-10-CM

## 2017-12-16 DIAGNOSIS — G894 Chronic pain syndrome: Secondary | ICD-10-CM

## 2017-12-16 DIAGNOSIS — G4700 Insomnia, unspecified: Secondary | ICD-10-CM

## 2017-12-20 DIAGNOSIS — E559 Vitamin D deficiency, unspecified: Secondary | ICD-10-CM

## 2017-12-20 DIAGNOSIS — Z466 Encounter for fitting and adjustment of urinary device: Secondary | ICD-10-CM

## 2017-12-20 DIAGNOSIS — Z794 Long term (current) use of insulin: Secondary | ICD-10-CM

## 2017-12-20 DIAGNOSIS — J449 Chronic obstructive pulmonary disease, unspecified: Secondary | ICD-10-CM

## 2017-12-20 DIAGNOSIS — F419 Anxiety disorder, unspecified: Secondary | ICD-10-CM

## 2017-12-20 DIAGNOSIS — Z87440 Personal history of urinary (tract) infections: Secondary | ICD-10-CM

## 2017-12-20 DIAGNOSIS — I110 Hypertensive heart disease with heart failure: Secondary | ICD-10-CM

## 2017-12-20 DIAGNOSIS — E785 Hyperlipidemia, unspecified: Secondary | ICD-10-CM

## 2017-12-20 DIAGNOSIS — E11621 Type 2 diabetes mellitus with foot ulcer: Secondary | ICD-10-CM

## 2017-12-20 DIAGNOSIS — L03315 Cellulitis of perineum: Secondary | ICD-10-CM

## 2017-12-20 DIAGNOSIS — R339 Retention of urine, unspecified: Secondary | ICD-10-CM

## 2017-12-20 DIAGNOSIS — G894 Chronic pain syndrome: Secondary | ICD-10-CM

## 2017-12-20 DIAGNOSIS — G4733 Obstructive sleep apnea (adult) (pediatric): Secondary | ICD-10-CM

## 2017-12-20 DIAGNOSIS — E039 Hypothyroidism, unspecified: Secondary | ICD-10-CM

## 2017-12-20 DIAGNOSIS — R350 Frequency of micturition: Secondary | ICD-10-CM

## 2017-12-20 DIAGNOSIS — E1142 Type 2 diabetes mellitus with diabetic polyneuropathy: Secondary | ICD-10-CM

## 2017-12-20 DIAGNOSIS — Z6841 Body Mass Index (BMI) 40.0 and over, adult: Secondary | ICD-10-CM

## 2017-12-20 DIAGNOSIS — L97521 Non-pressure chronic ulcer of other part of left foot limited to breakdown of skin: Secondary | ICD-10-CM

## 2017-12-20 DIAGNOSIS — G4700 Insomnia, unspecified: Secondary | ICD-10-CM

## 2017-12-20 DIAGNOSIS — Z9181 History of falling: Secondary | ICD-10-CM

## 2017-12-20 DIAGNOSIS — B9562 Methicillin resistant Staphylococcus aureus infection as the cause of diseases classified elsewhere: Secondary | ICD-10-CM

## 2017-12-20 DIAGNOSIS — M069 Rheumatoid arthritis, unspecified: Secondary | ICD-10-CM

## 2017-12-20 DIAGNOSIS — L0231 Cutaneous abscess of buttock: Secondary | ICD-10-CM

## 2017-12-20 DIAGNOSIS — N3946 Mixed incontinence: Secondary | ICD-10-CM

## 2017-12-20 DIAGNOSIS — I5032 Chronic diastolic (congestive) heart failure: Secondary | ICD-10-CM

## 2017-12-20 DIAGNOSIS — F319 Bipolar disorder, unspecified: Secondary | ICD-10-CM

## 2017-12-20 DIAGNOSIS — L02215 Cutaneous abscess of perineum: Principal | ICD-10-CM

## 2017-12-22 ENCOUNTER — Ambulatory Visit: Admit: 2017-12-22 | Discharge: 2017-12-22 | Payer: MEDICAID | Attending: Family | Primary: Family

## 2017-12-22 ENCOUNTER — Ambulatory Visit: Admit: 2017-12-22 | Discharge: 2017-12-22 | Payer: MEDICAID

## 2017-12-22 ENCOUNTER — Ambulatory Visit: Admit: 2017-12-22 | Discharge: 2017-12-23 | Disposition: A | Discharge status: Home / Self Care | Payer: MEDICAID

## 2017-12-22 ENCOUNTER — Emergency Department: Admit: 2017-12-22 | Discharge: 2017-12-23 | Disposition: A | Discharge status: Home / Self Care | Payer: MEDICAID

## 2017-12-22 DIAGNOSIS — I10 Essential (primary) hypertension: Secondary | ICD-10-CM

## 2017-12-22 DIAGNOSIS — R1900 Intra-abdominal and pelvic swelling, mass and lump, unspecified site: Principal | ICD-10-CM

## 2017-12-22 DIAGNOSIS — L02215 Cutaneous abscess of perineum: Secondary | ICD-10-CM

## 2017-12-22 DIAGNOSIS — E1165 Type 2 diabetes mellitus with hyperglycemia: Principal | ICD-10-CM

## 2017-12-22 DIAGNOSIS — T8143XA Infection following a procedure, organ and space surgical site, initial encounter: Secondary | ICD-10-CM

## 2017-12-22 DIAGNOSIS — L02415 Cutaneous abscess of right lower limb: Principal | ICD-10-CM

## 2017-12-22 DIAGNOSIS — R1031 Right lower quadrant pain: Secondary | ICD-10-CM

## 2017-12-22 DIAGNOSIS — F419 Anxiety disorder, unspecified: Secondary | ICD-10-CM

## 2017-12-22 DIAGNOSIS — M0579 Rheumatoid arthritis with rheumatoid factor of multiple sites without organ or systems involvement: Secondary | ICD-10-CM

## 2017-12-22 DIAGNOSIS — J449 Chronic obstructive pulmonary disease, unspecified: Secondary | ICD-10-CM

## 2017-12-22 MED ORDER — DOXYCYCLINE HYCLATE 100 MG CAPSULE
ORAL_CAPSULE | Freq: Two times a day (BID) | ORAL | 0 refills | 0.00000 days | Status: CP
Start: 2017-12-22 — End: 2018-01-01

## 2017-12-22 MED ORDER — PREDNISONE 10 MG TABLET
ORAL_TABLET | 0 refills | 0 days | Status: CP
Start: 2017-12-22 — End: 2018-01-19

## 2017-12-22 MED ORDER — OXYCODONE 5 MG TABLET
ORAL_TABLET | ORAL | 0 refills | 0 days | Status: CP | PRN
Start: 2017-12-22 — End: 2017-12-27

## 2017-12-23 DIAGNOSIS — E559 Vitamin D deficiency, unspecified: Secondary | ICD-10-CM

## 2017-12-23 DIAGNOSIS — Z794 Long term (current) use of insulin: Secondary | ICD-10-CM

## 2017-12-23 DIAGNOSIS — G4733 Obstructive sleep apnea (adult) (pediatric): Secondary | ICD-10-CM

## 2017-12-23 DIAGNOSIS — I5032 Chronic diastolic (congestive) heart failure: Secondary | ICD-10-CM

## 2017-12-23 DIAGNOSIS — F319 Bipolar disorder, unspecified: Secondary | ICD-10-CM

## 2017-12-23 DIAGNOSIS — G894 Chronic pain syndrome: Secondary | ICD-10-CM

## 2017-12-23 DIAGNOSIS — R339 Retention of urine, unspecified: Secondary | ICD-10-CM

## 2017-12-23 DIAGNOSIS — Z6841 Body Mass Index (BMI) 40.0 and over, adult: Secondary | ICD-10-CM

## 2017-12-23 DIAGNOSIS — E11621 Type 2 diabetes mellitus with foot ulcer: Secondary | ICD-10-CM

## 2017-12-23 DIAGNOSIS — L02215 Cutaneous abscess of perineum: Principal | ICD-10-CM

## 2017-12-23 DIAGNOSIS — L0231 Cutaneous abscess of buttock: Secondary | ICD-10-CM

## 2017-12-23 DIAGNOSIS — Z9181 History of falling: Secondary | ICD-10-CM

## 2017-12-23 DIAGNOSIS — L97521 Non-pressure chronic ulcer of other part of left foot limited to breakdown of skin: Secondary | ICD-10-CM

## 2017-12-23 DIAGNOSIS — E039 Hypothyroidism, unspecified: Secondary | ICD-10-CM

## 2017-12-23 DIAGNOSIS — E1142 Type 2 diabetes mellitus with diabetic polyneuropathy: Secondary | ICD-10-CM

## 2017-12-23 DIAGNOSIS — R350 Frequency of micturition: Secondary | ICD-10-CM

## 2017-12-23 DIAGNOSIS — N3946 Mixed incontinence: Secondary | ICD-10-CM

## 2017-12-23 DIAGNOSIS — J449 Chronic obstructive pulmonary disease, unspecified: Secondary | ICD-10-CM

## 2017-12-23 DIAGNOSIS — F419 Anxiety disorder, unspecified: Secondary | ICD-10-CM

## 2017-12-23 DIAGNOSIS — M069 Rheumatoid arthritis, unspecified: Secondary | ICD-10-CM

## 2017-12-23 DIAGNOSIS — B9562 Methicillin resistant Staphylococcus aureus infection as the cause of diseases classified elsewhere: Secondary | ICD-10-CM

## 2017-12-23 DIAGNOSIS — Z466 Encounter for fitting and adjustment of urinary device: Secondary | ICD-10-CM

## 2017-12-23 DIAGNOSIS — E785 Hyperlipidemia, unspecified: Secondary | ICD-10-CM

## 2017-12-23 DIAGNOSIS — L03315 Cellulitis of perineum: Secondary | ICD-10-CM

## 2017-12-23 DIAGNOSIS — Z87440 Personal history of urinary (tract) infections: Secondary | ICD-10-CM

## 2017-12-23 DIAGNOSIS — G4700 Insomnia, unspecified: Secondary | ICD-10-CM

## 2017-12-23 DIAGNOSIS — I110 Hypertensive heart disease with heart failure: Secondary | ICD-10-CM

## 2017-12-26 DIAGNOSIS — E559 Vitamin D deficiency, unspecified: Secondary | ICD-10-CM

## 2017-12-26 DIAGNOSIS — R339 Retention of urine, unspecified: Secondary | ICD-10-CM

## 2017-12-26 DIAGNOSIS — G4700 Insomnia, unspecified: Secondary | ICD-10-CM

## 2017-12-26 DIAGNOSIS — L97521 Non-pressure chronic ulcer of other part of left foot limited to breakdown of skin: Secondary | ICD-10-CM

## 2017-12-26 DIAGNOSIS — L0231 Cutaneous abscess of buttock: Secondary | ICD-10-CM

## 2017-12-26 DIAGNOSIS — E1142 Type 2 diabetes mellitus with diabetic polyneuropathy: Secondary | ICD-10-CM

## 2017-12-26 DIAGNOSIS — Z794 Long term (current) use of insulin: Secondary | ICD-10-CM

## 2017-12-26 DIAGNOSIS — Z466 Encounter for fitting and adjustment of urinary device: Secondary | ICD-10-CM

## 2017-12-26 DIAGNOSIS — I110 Hypertensive heart disease with heart failure: Secondary | ICD-10-CM

## 2017-12-26 DIAGNOSIS — G4733 Obstructive sleep apnea (adult) (pediatric): Secondary | ICD-10-CM

## 2017-12-26 DIAGNOSIS — E785 Hyperlipidemia, unspecified: Secondary | ICD-10-CM

## 2017-12-26 DIAGNOSIS — E039 Hypothyroidism, unspecified: Secondary | ICD-10-CM

## 2017-12-26 DIAGNOSIS — G894 Chronic pain syndrome: Secondary | ICD-10-CM

## 2017-12-26 DIAGNOSIS — M069 Rheumatoid arthritis, unspecified: Secondary | ICD-10-CM

## 2017-12-26 DIAGNOSIS — L03315 Cellulitis of perineum: Secondary | ICD-10-CM

## 2017-12-26 DIAGNOSIS — N3946 Mixed incontinence: Secondary | ICD-10-CM

## 2017-12-26 DIAGNOSIS — R350 Frequency of micturition: Secondary | ICD-10-CM

## 2017-12-26 DIAGNOSIS — Z9181 History of falling: Secondary | ICD-10-CM

## 2017-12-26 DIAGNOSIS — I5032 Chronic diastolic (congestive) heart failure: Secondary | ICD-10-CM

## 2017-12-26 DIAGNOSIS — Z87440 Personal history of urinary (tract) infections: Secondary | ICD-10-CM

## 2017-12-26 DIAGNOSIS — F419 Anxiety disorder, unspecified: Secondary | ICD-10-CM

## 2017-12-26 DIAGNOSIS — J449 Chronic obstructive pulmonary disease, unspecified: Secondary | ICD-10-CM

## 2017-12-26 DIAGNOSIS — B9562 Methicillin resistant Staphylococcus aureus infection as the cause of diseases classified elsewhere: Secondary | ICD-10-CM

## 2017-12-26 DIAGNOSIS — Z6841 Body Mass Index (BMI) 40.0 and over, adult: Secondary | ICD-10-CM

## 2017-12-26 DIAGNOSIS — F319 Bipolar disorder, unspecified: Secondary | ICD-10-CM

## 2017-12-26 DIAGNOSIS — E11621 Type 2 diabetes mellitus with foot ulcer: Secondary | ICD-10-CM

## 2017-12-26 DIAGNOSIS — L02215 Cutaneous abscess of perineum: Principal | ICD-10-CM

## 2017-12-26 MED ORDER — POTASSIUM CHLORIDE ER 20 MEQ TABLET,EXTENDED RELEASE(PART/CRYST)
ORAL_TABLET | Freq: Every day | ORAL | 1 refills | 0.00000 days | Status: CP
Start: 2017-12-26 — End: 2018-06-13

## 2017-12-28 ENCOUNTER — Other Ambulatory Visit: Payer: Self-pay | Admitting: Psychiatry

## 2017-12-28 NOTE — Telephone Encounter (Signed)
pt called states she needs a refill on her seroquel. she is out    QUEtiapine (SEROQUEL) 300 MG tablet  Medication  Date: 08/23/2017 Department: [facility] Ordering/Authorizing: Clapacs, Jackquline [place], MD  Order Providers   Prescribing Provider Encounter Provider  Clapacs, Jackquline [place], MD Clapacs, Jackquline [place], MD  Outpatient Medication Detail    Disp Refills Start End   QUEtiapine (SEROQUEL) 300 MG tablet 90 tablet 2 08/23/2017    Sig: TAKE 1 TABLET BY MOUTH EVERYDAY AT BEDTIME   Sent to pharmacy as: QUEtiapine (SEROQUEL) 300 MG tablet   E-Prescribing Status: Receipt confirmed by pharmacy (08/23/2017 9:39 PM EDT)

## 2017-12-31 ENCOUNTER — Other Ambulatory Visit: Payer: Self-pay | Admitting: Psychiatry

## 2018-01-06 MED ORDER — LEVOTHYROXINE 88 MCG TABLET
ORAL_TABLET | Freq: Every day | ORAL | 1 refills | 0.00000 days | Status: CP
Start: 2018-01-06 — End: 2018-08-07

## 2018-01-12 ENCOUNTER — Ambulatory Visit
Admit: 2018-01-12 | Discharge: 2018-01-13 | Payer: MEDICAID | Attending: Surgical Critical Care | Primary: Surgical Critical Care

## 2018-01-12 DIAGNOSIS — L02415 Cutaneous abscess of right lower limb: Secondary | ICD-10-CM

## 2018-01-12 DIAGNOSIS — L03115 Cellulitis of right lower limb: Principal | ICD-10-CM

## 2018-01-12 MED ORDER — NYSTATIN 100,000 UNIT/GRAM TOPICAL POWDER
Freq: Four times a day (QID) | TOPICAL | 1 refills | 0 days | Status: SS
Start: 2018-01-12 — End: 2018-06-29

## 2018-01-19 ENCOUNTER — Emergency Department: Admit: 2018-01-19 | Discharge: 2018-01-19 | Disposition: A | Discharge status: Home / Self Care | Payer: MEDICAID

## 2018-01-19 ENCOUNTER — Ambulatory Visit: Admit: 2018-01-19 | Discharge: 2018-01-19 | Payer: MEDICAID

## 2018-01-19 ENCOUNTER — Ambulatory Visit: Admit: 2018-01-19 | Discharge: 2018-01-19 | Payer: MEDICAID | Attending: Urology | Primary: Urology

## 2018-01-19 ENCOUNTER — Ambulatory Visit: Admit: 2018-01-19 | Discharge: 2018-01-19 | Disposition: A | Discharge status: Home / Self Care | Payer: MEDICAID

## 2018-01-19 DIAGNOSIS — T829XXA Unspecified complication of cardiac and vascular prosthetic device, implant and graft, initial encounter: Secondary | ICD-10-CM

## 2018-01-19 DIAGNOSIS — N3000 Acute cystitis without hematuria: Secondary | ICD-10-CM

## 2018-01-19 DIAGNOSIS — I509 Heart failure, unspecified: Secondary | ICD-10-CM

## 2018-01-19 DIAGNOSIS — N3941 Urge incontinence: Secondary | ICD-10-CM

## 2018-01-19 DIAGNOSIS — M869 Osteomyelitis, unspecified: Principal | ICD-10-CM

## 2018-01-19 DIAGNOSIS — R350 Frequency of micturition: Principal | ICD-10-CM

## 2018-01-19 DIAGNOSIS — E11621 Type 2 diabetes mellitus with foot ulcer: Principal | ICD-10-CM

## 2018-01-19 MED ORDER — PREDNISONE 20 MG TABLET
ORAL_TABLET | Freq: Two times a day (BID) | ORAL | 0 refills | 0.00000 days | Status: SS
Start: 2018-01-19 — End: 2018-02-07

## 2018-01-22 MED ORDER — AMOXICILLIN 875 MG-POTASSIUM CLAVULANATE 125 MG TABLET
ORAL_TABLET | Freq: Two times a day (BID) | ORAL | 0 refills | 0.00000 days | Status: SS
Start: 2018-01-22 — End: 2018-02-07

## 2018-01-23 ENCOUNTER — Ambulatory Visit: Admit: 2018-01-23 | Discharge: 2018-01-23 | Payer: MEDICAID | Attending: Family | Primary: Family

## 2018-01-23 DIAGNOSIS — E11621 Type 2 diabetes mellitus with foot ulcer: Secondary | ICD-10-CM

## 2018-01-23 DIAGNOSIS — L97519 Non-pressure chronic ulcer of other part of right foot with unspecified severity: Secondary | ICD-10-CM

## 2018-01-23 DIAGNOSIS — L97429 Non-pressure chronic ulcer of left heel and midfoot with unspecified severity: Secondary | ICD-10-CM

## 2018-01-23 DIAGNOSIS — Z23 Encounter for immunization: Secondary | ICD-10-CM

## 2018-01-23 DIAGNOSIS — E1165 Type 2 diabetes mellitus with hyperglycemia: Principal | ICD-10-CM

## 2018-01-23 MED ORDER — ATORVASTATIN 80 MG TABLET
ORAL_TABLET | Freq: Every day | ORAL | 3 refills | 0.00000 days | Status: CP
Start: 2018-01-23 — End: 2018-09-28

## 2018-01-23 MED ORDER — INSULIN GLARGINE (U-100) 100 UNIT/ML SUBCUTANEOUS SOLUTION
11 refills | 0 days | Status: SS
Start: 2018-01-23 — End: 2018-02-07

## 2018-02-03 ENCOUNTER — Ambulatory Visit: Admit: 2018-02-03 | Discharge: 2018-02-07 | Disposition: A | Discharge status: Home Health | Payer: MEDICAID

## 2018-02-03 DIAGNOSIS — R739 Hyperglycemia, unspecified: Principal | ICD-10-CM

## 2018-02-05 DIAGNOSIS — R739 Hyperglycemia, unspecified: Principal | ICD-10-CM

## 2018-02-07 MED ORDER — PREDNISONE 10 MG TABLET
ORAL_TABLET | Freq: Every day | ORAL | 0 refills | 0 days | Status: SS
Start: 2018-02-07 — End: 2018-04-04

## 2018-02-07 MED ORDER — INSULIN LISPRO (U-100) 100 UNIT/ML SUBCUTANEOUS SOLUTION: mL | 11 refills | 0 days | Status: AC

## 2018-02-07 MED ORDER — AMOXICILLIN 875 MG-POTASSIUM CLAVULANATE 125 MG TABLET
ORAL_TABLET | Freq: Two times a day (BID) | ORAL | 0 refills | 0 days | Status: SS
Start: 2018-02-07 — End: 2018-02-24

## 2018-02-07 MED ORDER — DOXYCYCLINE HYCLATE 100 MG CAPSULE
ORAL_CAPSULE | Freq: Two times a day (BID) | ORAL | 0 refills | 0.00000 days | Status: CP
Start: 2018-02-07 — End: 2018-03-08

## 2018-02-07 MED ORDER — VANCOMYCIN 125 MG CAPSULE
ORAL_CAPSULE | Freq: Four times a day (QID) | ORAL | 0 refills | 0.00000 days | Status: CP
Start: 2018-02-07 — End: 2018-03-08

## 2018-02-07 MED ORDER — INSULIN LISPRO (U-100) 100 UNIT/ML SUBCUTANEOUS SOLUTION
11 refills | 0.00000 days | Status: CP
Start: 2018-02-07 — End: 2018-04-04

## 2018-02-07 MED ORDER — INSULIN GLARGINE (U-100) 100 UNIT/ML SUBCUTANEOUS SOLUTION
Freq: Every evening | SUBCUTANEOUS | 0 refills | 0 days | Status: CP
Start: 2018-02-07 — End: 2018-03-08

## 2018-02-08 DIAGNOSIS — Z6841 Body Mass Index (BMI) 40.0 and over, adult: Secondary | ICD-10-CM

## 2018-02-08 DIAGNOSIS — L97421 Non-pressure chronic ulcer of left heel and midfoot limited to breakdown of skin: Secondary | ICD-10-CM

## 2018-02-08 DIAGNOSIS — G4733 Obstructive sleep apnea (adult) (pediatric): Secondary | ICD-10-CM

## 2018-02-08 DIAGNOSIS — E1165 Type 2 diabetes mellitus with hyperglycemia: Secondary | ICD-10-CM

## 2018-02-08 DIAGNOSIS — F5105 Insomnia due to other mental disorder: Secondary | ICD-10-CM

## 2018-02-08 DIAGNOSIS — Z794 Long term (current) use of insulin: Secondary | ICD-10-CM

## 2018-02-08 DIAGNOSIS — N3946 Mixed incontinence: Secondary | ICD-10-CM

## 2018-02-08 DIAGNOSIS — R350 Frequency of micturition: Secondary | ICD-10-CM

## 2018-02-08 DIAGNOSIS — L97511 Non-pressure chronic ulcer of other part of right foot limited to breakdown of skin: Secondary | ICD-10-CM

## 2018-02-08 DIAGNOSIS — J449 Chronic obstructive pulmonary disease, unspecified: Secondary | ICD-10-CM

## 2018-02-08 DIAGNOSIS — E559 Vitamin D deficiency, unspecified: Secondary | ICD-10-CM

## 2018-02-08 DIAGNOSIS — G894 Chronic pain syndrome: Secondary | ICD-10-CM

## 2018-02-08 DIAGNOSIS — E11621 Type 2 diabetes mellitus with foot ulcer: Principal | ICD-10-CM

## 2018-02-08 DIAGNOSIS — I5032 Chronic diastolic (congestive) heart failure: Secondary | ICD-10-CM

## 2018-02-08 DIAGNOSIS — F319 Bipolar disorder, unspecified: Secondary | ICD-10-CM

## 2018-02-08 DIAGNOSIS — Z87891 Personal history of nicotine dependence: Secondary | ICD-10-CM

## 2018-02-08 DIAGNOSIS — L97411 Non-pressure chronic ulcer of right heel and midfoot limited to breakdown of skin: Secondary | ICD-10-CM

## 2018-02-08 DIAGNOSIS — E1142 Type 2 diabetes mellitus with diabetic polyneuropathy: Secondary | ICD-10-CM

## 2018-02-08 DIAGNOSIS — I110 Hypertensive heart disease with heart failure: Secondary | ICD-10-CM

## 2018-02-08 DIAGNOSIS — E039 Hypothyroidism, unspecified: Secondary | ICD-10-CM

## 2018-02-08 DIAGNOSIS — A0472 Enterocolitis due to Clostridium difficile, not specified as recurrent: Secondary | ICD-10-CM

## 2018-02-08 DIAGNOSIS — F419 Anxiety disorder, unspecified: Secondary | ICD-10-CM

## 2018-02-08 DIAGNOSIS — M519 Unspecified thoracic, thoracolumbar and lumbosacral intervertebral disc disorder: Secondary | ICD-10-CM

## 2018-02-08 DIAGNOSIS — M069 Rheumatoid arthritis, unspecified: Secondary | ICD-10-CM

## 2018-02-09 ENCOUNTER — Encounter: Admit: 2018-02-09 | Discharge: 2018-04-09 | Payer: MEDICAID

## 2018-02-09 ENCOUNTER — Inpatient Hospital Stay: Admit: 2018-02-09 | Discharge: 2018-04-09 | Payer: MEDICAID

## 2018-02-09 DIAGNOSIS — E1165 Type 2 diabetes mellitus with hyperglycemia: Secondary | ICD-10-CM

## 2018-02-09 DIAGNOSIS — R350 Frequency of micturition: Secondary | ICD-10-CM

## 2018-02-09 DIAGNOSIS — E039 Hypothyroidism, unspecified: Secondary | ICD-10-CM

## 2018-02-09 DIAGNOSIS — L97421 Non-pressure chronic ulcer of left heel and midfoot limited to breakdown of skin: Secondary | ICD-10-CM

## 2018-02-09 DIAGNOSIS — E559 Vitamin D deficiency, unspecified: Secondary | ICD-10-CM

## 2018-02-09 DIAGNOSIS — L97411 Non-pressure chronic ulcer of right heel and midfoot limited to breakdown of skin: Secondary | ICD-10-CM

## 2018-02-09 DIAGNOSIS — I5032 Chronic diastolic (congestive) heart failure: Secondary | ICD-10-CM

## 2018-02-09 DIAGNOSIS — E11621 Type 2 diabetes mellitus with foot ulcer: Principal | ICD-10-CM

## 2018-02-09 DIAGNOSIS — F5105 Insomnia due to other mental disorder: Secondary | ICD-10-CM

## 2018-02-09 DIAGNOSIS — G894 Chronic pain syndrome: Secondary | ICD-10-CM

## 2018-02-09 DIAGNOSIS — I110 Hypertensive heart disease with heart failure: Secondary | ICD-10-CM

## 2018-02-09 DIAGNOSIS — F319 Bipolar disorder, unspecified: Secondary | ICD-10-CM

## 2018-02-09 DIAGNOSIS — G4733 Obstructive sleep apnea (adult) (pediatric): Secondary | ICD-10-CM

## 2018-02-09 DIAGNOSIS — N3946 Mixed incontinence: Secondary | ICD-10-CM

## 2018-02-09 DIAGNOSIS — M069 Rheumatoid arthritis, unspecified: Secondary | ICD-10-CM

## 2018-02-09 DIAGNOSIS — Z6841 Body Mass Index (BMI) 40.0 and over, adult: Secondary | ICD-10-CM

## 2018-02-09 DIAGNOSIS — A0472 Enterocolitis due to Clostridium difficile, not specified as recurrent: Secondary | ICD-10-CM

## 2018-02-09 DIAGNOSIS — F419 Anxiety disorder, unspecified: Secondary | ICD-10-CM

## 2018-02-09 DIAGNOSIS — J449 Chronic obstructive pulmonary disease, unspecified: Secondary | ICD-10-CM

## 2018-02-09 DIAGNOSIS — Z87891 Personal history of nicotine dependence: Secondary | ICD-10-CM

## 2018-02-09 DIAGNOSIS — M519 Unspecified thoracic, thoracolumbar and lumbosacral intervertebral disc disorder: Secondary | ICD-10-CM

## 2018-02-09 DIAGNOSIS — L97511 Non-pressure chronic ulcer of other part of right foot limited to breakdown of skin: Secondary | ICD-10-CM

## 2018-02-09 DIAGNOSIS — E1142 Type 2 diabetes mellitus with diabetic polyneuropathy: Secondary | ICD-10-CM

## 2018-02-09 DIAGNOSIS — Z794 Long term (current) use of insulin: Secondary | ICD-10-CM

## 2018-02-13 MED ORDER — METHOTREXATE SODIUM (PF) 25 MG/ML INJECTION SOLUTION
SUBCUTANEOUS | 0 refills | 0.00000 days | Status: SS
Start: 2018-02-13 — End: 2018-04-04

## 2018-02-14 ENCOUNTER — Ambulatory Visit: Admit: 2018-02-14 | Discharge: 2018-02-15 | Payer: MEDICAID | Attending: Vascular Surgery | Primary: Vascular Surgery

## 2018-02-14 DIAGNOSIS — E11621 Type 2 diabetes mellitus with foot ulcer: Principal | ICD-10-CM

## 2018-02-14 DIAGNOSIS — L97422 Non-pressure chronic ulcer of left heel and midfoot with fat layer exposed: Secondary | ICD-10-CM

## 2018-02-14 DIAGNOSIS — L97519 Non-pressure chronic ulcer of other part of right foot with unspecified severity: Secondary | ICD-10-CM

## 2018-02-14 DIAGNOSIS — L97412 Non-pressure chronic ulcer of right heel and midfoot with fat layer exposed: Secondary | ICD-10-CM

## 2018-02-15 ENCOUNTER — Telehealth: Payer: Self-pay | Admitting: Nurse Practitioner

## 2018-02-15 DIAGNOSIS — G4733 Obstructive sleep apnea (adult) (pediatric): Secondary | ICD-10-CM

## 2018-02-15 DIAGNOSIS — R350 Frequency of micturition: Secondary | ICD-10-CM

## 2018-02-15 DIAGNOSIS — I5032 Chronic diastolic (congestive) heart failure: Secondary | ICD-10-CM

## 2018-02-15 DIAGNOSIS — G894 Chronic pain syndrome: Secondary | ICD-10-CM

## 2018-02-15 DIAGNOSIS — F419 Anxiety disorder, unspecified: Secondary | ICD-10-CM

## 2018-02-15 DIAGNOSIS — E11621 Type 2 diabetes mellitus with foot ulcer: Principal | ICD-10-CM

## 2018-02-15 DIAGNOSIS — L97411 Non-pressure chronic ulcer of right heel and midfoot limited to breakdown of skin: Secondary | ICD-10-CM

## 2018-02-15 DIAGNOSIS — I110 Hypertensive heart disease with heart failure: Secondary | ICD-10-CM

## 2018-02-15 DIAGNOSIS — Z794 Long term (current) use of insulin: Secondary | ICD-10-CM

## 2018-02-15 DIAGNOSIS — E559 Vitamin D deficiency, unspecified: Secondary | ICD-10-CM

## 2018-02-15 DIAGNOSIS — E039 Hypothyroidism, unspecified: Secondary | ICD-10-CM

## 2018-02-15 DIAGNOSIS — N3946 Mixed incontinence: Secondary | ICD-10-CM

## 2018-02-15 DIAGNOSIS — E1142 Type 2 diabetes mellitus with diabetic polyneuropathy: Secondary | ICD-10-CM

## 2018-02-15 DIAGNOSIS — Z6841 Body Mass Index (BMI) 40.0 and over, adult: Secondary | ICD-10-CM

## 2018-02-15 DIAGNOSIS — M519 Unspecified thoracic, thoracolumbar and lumbosacral intervertebral disc disorder: Secondary | ICD-10-CM

## 2018-02-15 DIAGNOSIS — Z87891 Personal history of nicotine dependence: Secondary | ICD-10-CM

## 2018-02-15 DIAGNOSIS — F319 Bipolar disorder, unspecified: Secondary | ICD-10-CM

## 2018-02-15 DIAGNOSIS — M069 Rheumatoid arthritis, unspecified: Secondary | ICD-10-CM

## 2018-02-15 DIAGNOSIS — J449 Chronic obstructive pulmonary disease, unspecified: Secondary | ICD-10-CM

## 2018-02-15 DIAGNOSIS — A0472 Enterocolitis due to Clostridium difficile, not specified as recurrent: Secondary | ICD-10-CM

## 2018-02-15 DIAGNOSIS — E1165 Type 2 diabetes mellitus with hyperglycemia: Secondary | ICD-10-CM

## 2018-02-15 DIAGNOSIS — L97421 Non-pressure chronic ulcer of left heel and midfoot limited to breakdown of skin: Secondary | ICD-10-CM

## 2018-02-15 DIAGNOSIS — L97511 Non-pressure chronic ulcer of other part of right foot limited to breakdown of skin: Secondary | ICD-10-CM

## 2018-02-15 DIAGNOSIS — F5105 Insomnia due to other mental disorder: Secondary | ICD-10-CM

## 2018-02-15 NOTE — Telephone Encounter (Signed)
Needs refill sent in for prednisone which will also need prior auth. pts appt is nov. 20th

## 2018-02-15 NOTE — Telephone Encounter (Signed)
We done not prescribe this She needs to call her rheumatologist

## 2018-02-17 DIAGNOSIS — E559 Vitamin D deficiency, unspecified: Secondary | ICD-10-CM

## 2018-02-17 DIAGNOSIS — N3946 Mixed incontinence: Secondary | ICD-10-CM

## 2018-02-17 DIAGNOSIS — I110 Hypertensive heart disease with heart failure: Secondary | ICD-10-CM

## 2018-02-17 DIAGNOSIS — M519 Unspecified thoracic, thoracolumbar and lumbosacral intervertebral disc disorder: Secondary | ICD-10-CM

## 2018-02-17 DIAGNOSIS — F419 Anxiety disorder, unspecified: Secondary | ICD-10-CM

## 2018-02-17 DIAGNOSIS — I5032 Chronic diastolic (congestive) heart failure: Secondary | ICD-10-CM

## 2018-02-17 DIAGNOSIS — Z6841 Body Mass Index (BMI) 40.0 and over, adult: Secondary | ICD-10-CM

## 2018-02-17 DIAGNOSIS — G894 Chronic pain syndrome: Secondary | ICD-10-CM

## 2018-02-17 DIAGNOSIS — G4733 Obstructive sleep apnea (adult) (pediatric): Secondary | ICD-10-CM

## 2018-02-17 DIAGNOSIS — F5105 Insomnia due to other mental disorder: Secondary | ICD-10-CM

## 2018-02-17 DIAGNOSIS — A0472 Enterocolitis due to Clostridium difficile, not specified as recurrent: Secondary | ICD-10-CM

## 2018-02-17 DIAGNOSIS — L97511 Non-pressure chronic ulcer of other part of right foot limited to breakdown of skin: Secondary | ICD-10-CM

## 2018-02-17 DIAGNOSIS — Z794 Long term (current) use of insulin: Secondary | ICD-10-CM

## 2018-02-17 DIAGNOSIS — E039 Hypothyroidism, unspecified: Secondary | ICD-10-CM

## 2018-02-17 DIAGNOSIS — Z87891 Personal history of nicotine dependence: Secondary | ICD-10-CM

## 2018-02-17 DIAGNOSIS — R350 Frequency of micturition: Secondary | ICD-10-CM

## 2018-02-17 DIAGNOSIS — F319 Bipolar disorder, unspecified: Secondary | ICD-10-CM

## 2018-02-17 DIAGNOSIS — L97421 Non-pressure chronic ulcer of left heel and midfoot limited to breakdown of skin: Secondary | ICD-10-CM

## 2018-02-17 DIAGNOSIS — M069 Rheumatoid arthritis, unspecified: Secondary | ICD-10-CM

## 2018-02-17 DIAGNOSIS — E1142 Type 2 diabetes mellitus with diabetic polyneuropathy: Secondary | ICD-10-CM

## 2018-02-17 DIAGNOSIS — L97411 Non-pressure chronic ulcer of right heel and midfoot limited to breakdown of skin: Secondary | ICD-10-CM

## 2018-02-17 DIAGNOSIS — J449 Chronic obstructive pulmonary disease, unspecified: Secondary | ICD-10-CM

## 2018-02-17 DIAGNOSIS — E11621 Type 2 diabetes mellitus with foot ulcer: Principal | ICD-10-CM

## 2018-02-17 DIAGNOSIS — E1165 Type 2 diabetes mellitus with hyperglycemia: Secondary | ICD-10-CM

## 2018-02-21 ENCOUNTER — Ambulatory Visit
Admit: 2018-02-21 | Discharge: 2018-03-08 | Disposition: A | Discharge status: Other Acute Inpatient Hospital | Payer: MEDICAID | Source: Other Acute Inpatient Hospital

## 2018-02-21 ENCOUNTER — Encounter
Admit: 2018-02-21 | Discharge: 2018-03-08 | Disposition: A | Discharge status: Other Acute Inpatient Hospital | Payer: MEDICAID | Source: Other Acute Inpatient Hospital | Attending: Anesthesiology

## 2018-02-21 ENCOUNTER — Encounter
Admit: 2018-02-21 | Discharge: 2018-03-08 | Disposition: A | Discharge status: Other Acute Inpatient Hospital | Payer: MEDICAID | Source: Other Acute Inpatient Hospital | Attending: Student in an Organized Health Care Education/Training Program

## 2018-02-21 ENCOUNTER — Ambulatory Visit: Admit: 2018-02-21 | Discharge: 2018-02-21 | Payer: MEDICAID | Attending: Family | Primary: Family

## 2018-02-21 ENCOUNTER — Ambulatory Visit: Admit: 2018-02-21 | Discharge: 2018-02-21 | Payer: MEDICAID | Attending: Vascular Surgery | Primary: Vascular Surgery

## 2018-02-21 ENCOUNTER — Ambulatory Visit: Admit: 2018-02-21 | Discharge: 2018-02-21 | Payer: MEDICAID

## 2018-02-21 DIAGNOSIS — N3946 Mixed incontinence: Secondary | ICD-10-CM

## 2018-02-21 DIAGNOSIS — M519 Unspecified thoracic, thoracolumbar and lumbosacral intervertebral disc disorder: Secondary | ICD-10-CM

## 2018-02-21 DIAGNOSIS — E039 Hypothyroidism, unspecified: Secondary | ICD-10-CM

## 2018-02-21 DIAGNOSIS — F419 Anxiety disorder, unspecified: Secondary | ICD-10-CM

## 2018-02-21 DIAGNOSIS — E559 Vitamin D deficiency, unspecified: Secondary | ICD-10-CM

## 2018-02-21 DIAGNOSIS — L97519 Non-pressure chronic ulcer of other part of right foot with unspecified severity: Secondary | ICD-10-CM

## 2018-02-21 DIAGNOSIS — L97421 Non-pressure chronic ulcer of left heel and midfoot limited to breakdown of skin: Secondary | ICD-10-CM

## 2018-02-21 DIAGNOSIS — A0472 Enterocolitis due to Clostridium difficile, not specified as recurrent: Secondary | ICD-10-CM

## 2018-02-21 DIAGNOSIS — I5032 Chronic diastolic (congestive) heart failure: Secondary | ICD-10-CM

## 2018-02-21 DIAGNOSIS — G4733 Obstructive sleep apnea (adult) (pediatric): Secondary | ICD-10-CM

## 2018-02-21 DIAGNOSIS — G894 Chronic pain syndrome: Secondary | ICD-10-CM

## 2018-02-21 DIAGNOSIS — L97411 Non-pressure chronic ulcer of right heel and midfoot limited to breakdown of skin: Secondary | ICD-10-CM

## 2018-02-21 DIAGNOSIS — F319 Bipolar disorder, unspecified: Secondary | ICD-10-CM

## 2018-02-21 DIAGNOSIS — J449 Chronic obstructive pulmonary disease, unspecified: Secondary | ICD-10-CM

## 2018-02-21 DIAGNOSIS — L97422 Non-pressure chronic ulcer of left heel and midfoot with fat layer exposed: Secondary | ICD-10-CM

## 2018-02-21 DIAGNOSIS — R350 Frequency of micturition: Secondary | ICD-10-CM

## 2018-02-21 DIAGNOSIS — E1165 Type 2 diabetes mellitus with hyperglycemia: Secondary | ICD-10-CM

## 2018-02-21 DIAGNOSIS — Z87891 Personal history of nicotine dependence: Secondary | ICD-10-CM

## 2018-02-21 DIAGNOSIS — E1142 Type 2 diabetes mellitus with diabetic polyneuropathy: Secondary | ICD-10-CM

## 2018-02-21 DIAGNOSIS — L97511 Non-pressure chronic ulcer of other part of right foot limited to breakdown of skin: Secondary | ICD-10-CM

## 2018-02-21 DIAGNOSIS — L97412 Non-pressure chronic ulcer of right heel and midfoot with fat layer exposed: Principal | ICD-10-CM

## 2018-02-21 DIAGNOSIS — Z794 Long term (current) use of insulin: Secondary | ICD-10-CM

## 2018-02-21 DIAGNOSIS — Z6841 Body Mass Index (BMI) 40.0 and over, adult: Secondary | ICD-10-CM

## 2018-02-21 DIAGNOSIS — E11621 Type 2 diabetes mellitus with foot ulcer: Principal | ICD-10-CM

## 2018-02-21 DIAGNOSIS — I110 Hypertensive heart disease with heart failure: Secondary | ICD-10-CM

## 2018-02-21 DIAGNOSIS — F5105 Insomnia due to other mental disorder: Secondary | ICD-10-CM

## 2018-02-21 DIAGNOSIS — M069 Rheumatoid arthritis, unspecified: Secondary | ICD-10-CM

## 2018-02-21 DIAGNOSIS — L89619 Pressure ulcer of right heel, unspecified stage: Secondary | ICD-10-CM | POA: Insufficient documentation

## 2018-02-21 DIAGNOSIS — L89629 Pressure ulcer of left heel, unspecified stage: Secondary | ICD-10-CM

## 2018-02-22 DIAGNOSIS — E11621 Type 2 diabetes mellitus with foot ulcer: Principal | ICD-10-CM

## 2018-02-23 ENCOUNTER — Ambulatory Visit: Payer: Medicaid Other | Admitting: Psychiatry

## 2018-02-23 DIAGNOSIS — E11621 Type 2 diabetes mellitus with foot ulcer: Principal | ICD-10-CM

## 2018-02-23 HISTORY — PX: DG GREAT TOE RIGHT FOOT: HXRAD1657

## 2018-02-24 DIAGNOSIS — L97421 Non-pressure chronic ulcer of left heel and midfoot limited to breakdown of skin: Secondary | ICD-10-CM

## 2018-02-24 DIAGNOSIS — G4733 Obstructive sleep apnea (adult) (pediatric): Secondary | ICD-10-CM

## 2018-02-24 DIAGNOSIS — L97511 Non-pressure chronic ulcer of other part of right foot limited to breakdown of skin: Secondary | ICD-10-CM

## 2018-02-24 DIAGNOSIS — N3946 Mixed incontinence: Secondary | ICD-10-CM

## 2018-02-24 DIAGNOSIS — J449 Chronic obstructive pulmonary disease, unspecified: Secondary | ICD-10-CM

## 2018-02-24 DIAGNOSIS — A0472 Enterocolitis due to Clostridium difficile, not specified as recurrent: Secondary | ICD-10-CM

## 2018-02-24 DIAGNOSIS — Z6841 Body Mass Index (BMI) 40.0 and over, adult: Secondary | ICD-10-CM

## 2018-02-24 DIAGNOSIS — Z87891 Personal history of nicotine dependence: Secondary | ICD-10-CM

## 2018-02-24 DIAGNOSIS — L97411 Non-pressure chronic ulcer of right heel and midfoot limited to breakdown of skin: Secondary | ICD-10-CM

## 2018-02-24 DIAGNOSIS — E559 Vitamin D deficiency, unspecified: Secondary | ICD-10-CM

## 2018-02-24 DIAGNOSIS — M069 Rheumatoid arthritis, unspecified: Secondary | ICD-10-CM

## 2018-02-24 DIAGNOSIS — G894 Chronic pain syndrome: Secondary | ICD-10-CM

## 2018-02-24 DIAGNOSIS — I110 Hypertensive heart disease with heart failure: Secondary | ICD-10-CM

## 2018-02-24 DIAGNOSIS — M519 Unspecified thoracic, thoracolumbar and lumbosacral intervertebral disc disorder: Secondary | ICD-10-CM

## 2018-02-24 DIAGNOSIS — F419 Anxiety disorder, unspecified: Secondary | ICD-10-CM

## 2018-02-24 DIAGNOSIS — F319 Bipolar disorder, unspecified: Secondary | ICD-10-CM

## 2018-02-24 DIAGNOSIS — R350 Frequency of micturition: Secondary | ICD-10-CM

## 2018-02-24 DIAGNOSIS — I5032 Chronic diastolic (congestive) heart failure: Secondary | ICD-10-CM

## 2018-02-24 DIAGNOSIS — E039 Hypothyroidism, unspecified: Secondary | ICD-10-CM

## 2018-02-24 DIAGNOSIS — Z794 Long term (current) use of insulin: Secondary | ICD-10-CM

## 2018-02-24 DIAGNOSIS — E1165 Type 2 diabetes mellitus with hyperglycemia: Secondary | ICD-10-CM

## 2018-02-24 DIAGNOSIS — E1142 Type 2 diabetes mellitus with diabetic polyneuropathy: Secondary | ICD-10-CM

## 2018-02-24 DIAGNOSIS — F5105 Insomnia due to other mental disorder: Secondary | ICD-10-CM

## 2018-02-24 DIAGNOSIS — E11621 Type 2 diabetes mellitus with foot ulcer: Principal | ICD-10-CM

## 2018-02-25 DIAGNOSIS — E11621 Type 2 diabetes mellitus with foot ulcer: Principal | ICD-10-CM

## 2018-02-27 DIAGNOSIS — E11621 Type 2 diabetes mellitus with foot ulcer: Principal | ICD-10-CM

## 2018-02-28 DIAGNOSIS — E11621 Type 2 diabetes mellitus with foot ulcer: Principal | ICD-10-CM

## 2018-03-02 DIAGNOSIS — E11621 Type 2 diabetes mellitus with foot ulcer: Principal | ICD-10-CM

## 2018-03-08 ENCOUNTER — Encounter: Payer: Medicaid Other | Admitting: Nurse Practitioner

## 2018-03-08 ENCOUNTER — Encounter
Admit: 2018-03-08 | Discharge: 2018-03-15 | Disposition: A | Discharge status: Home Health | Payer: MEDICAID | Source: Other Acute Inpatient Hospital

## 2018-03-08 ENCOUNTER — Ambulatory Visit
Admit: 2018-03-08 | Discharge: 2018-03-15 | Disposition: A | Discharge status: Home Health | Payer: MEDICAID | Source: Other Acute Inpatient Hospital

## 2018-03-08 DIAGNOSIS — E1165 Type 2 diabetes mellitus with hyperglycemia: Principal | ICD-10-CM

## 2018-03-08 MED ORDER — POLYETHYLENE GLYCOL 3350 17 GRAM ORAL POWDER PACKET
Freq: Every day | ORAL | 0 refills | 0.00000 days | Status: SS | PRN
Start: 2018-03-08 — End: 2018-04-03

## 2018-03-08 MED ORDER — INSULIN GLARGINE (U-100) 100 UNIT/ML SUBCUTANEOUS SOLUTION
Freq: Every evening | SUBCUTANEOUS | 0 refills | 0.00000 days
Start: 2018-03-08 — End: 2018-04-04

## 2018-03-08 MED ORDER — SENNOSIDES 8.6 MG TABLET
Freq: Every evening | ORAL | 0 refills | 0.00000 days | PRN
Start: 2018-03-08 — End: 2018-04-07

## 2018-03-08 MED ORDER — LINEZOLID 600 MG TABLET
Freq: Two times a day (BID) | ORAL | 0 refills | 0.00000 days
Start: 2018-03-08 — End: 2018-03-15

## 2018-03-08 MED ORDER — CLOTRIMAZOLE 1 % TOPICAL CREAM
Freq: Every day | TOPICAL | 0 refills | 0.00000 days | Status: SS | PRN
Start: 2018-03-08 — End: 2018-08-26

## 2018-03-08 MED ORDER — CIPROFLOXACIN 500 MG TABLET
Freq: Two times a day (BID) | ORAL | 0 refills | 0.00000 days
Start: 2018-03-08 — End: 2018-03-15

## 2018-03-08 MED ORDER — DICLOFENAC 1 % TOPICAL GEL
Freq: Four times a day (QID) | TOPICAL | 0 refills | 0.00000 days | Status: SS
Start: 2018-03-08 — End: 2018-06-29

## 2018-03-08 MED ORDER — OXYCODONE 5 MG CAPSULE
ORAL_CAPSULE | Freq: Four times a day (QID) | ORAL | 0 refills | 0.00000 days | PRN
Start: 2018-03-08 — End: 2018-03-15

## 2018-03-08 MED ORDER — MELATONIN 3 MG TABLET
Freq: Every evening | ORAL | 0 refills | 0.00000 days | PRN
Start: 2018-03-08 — End: ?

## 2018-03-08 MED ORDER — METRONIDAZOLE 500 MG TABLET
Freq: Three times a day (TID) | ORAL | 0 refills | 0.00000 days
Start: 2018-03-08 — End: 2018-03-15

## 2018-03-08 MED ORDER — VANCOMYCIN 25 MG/ML ORAL SOLUTION
Freq: Four times a day (QID) | ORAL | 0 refills | 0.00000 days
Start: 2018-03-08 — End: 2018-03-15

## 2018-03-09 DIAGNOSIS — E1165 Type 2 diabetes mellitus with hyperglycemia: Principal | ICD-10-CM

## 2018-03-09 MED ORDER — LEFLUNOMIDE 10 MG TABLET
ORAL_TABLET | Freq: Every day | ORAL | 2 refills | 0 days | Status: CP
Start: 2018-03-09 — End: 2018-06-13

## 2018-03-10 DIAGNOSIS — E1165 Type 2 diabetes mellitus with hyperglycemia: Principal | ICD-10-CM

## 2018-03-11 DIAGNOSIS — E1165 Type 2 diabetes mellitus with hyperglycemia: Principal | ICD-10-CM

## 2018-03-12 DIAGNOSIS — E1165 Type 2 diabetes mellitus with hyperglycemia: Principal | ICD-10-CM

## 2018-03-13 DIAGNOSIS — E1165 Type 2 diabetes mellitus with hyperglycemia: Principal | ICD-10-CM

## 2018-03-14 DIAGNOSIS — E1165 Type 2 diabetes mellitus with hyperglycemia: Principal | ICD-10-CM

## 2018-03-15 DIAGNOSIS — E1165 Type 2 diabetes mellitus with hyperglycemia: Principal | ICD-10-CM

## 2018-03-17 DIAGNOSIS — M519 Unspecified thoracic, thoracolumbar and lumbosacral intervertebral disc disorder: Secondary | ICD-10-CM

## 2018-03-17 DIAGNOSIS — L97411 Non-pressure chronic ulcer of right heel and midfoot limited to breakdown of skin: Secondary | ICD-10-CM

## 2018-03-17 DIAGNOSIS — I5032 Chronic diastolic (congestive) heart failure: Secondary | ICD-10-CM

## 2018-03-17 DIAGNOSIS — E1142 Type 2 diabetes mellitus with diabetic polyneuropathy: Secondary | ICD-10-CM

## 2018-03-17 DIAGNOSIS — F419 Anxiety disorder, unspecified: Secondary | ICD-10-CM

## 2018-03-17 DIAGNOSIS — E11621 Type 2 diabetes mellitus with foot ulcer: Principal | ICD-10-CM

## 2018-03-17 DIAGNOSIS — I110 Hypertensive heart disease with heart failure: Secondary | ICD-10-CM

## 2018-03-17 DIAGNOSIS — Z794 Long term (current) use of insulin: Secondary | ICD-10-CM

## 2018-03-17 DIAGNOSIS — E559 Vitamin D deficiency, unspecified: Secondary | ICD-10-CM

## 2018-03-17 DIAGNOSIS — M069 Rheumatoid arthritis, unspecified: Secondary | ICD-10-CM

## 2018-03-17 DIAGNOSIS — N3946 Mixed incontinence: Secondary | ICD-10-CM

## 2018-03-17 DIAGNOSIS — G4733 Obstructive sleep apnea (adult) (pediatric): Secondary | ICD-10-CM

## 2018-03-17 DIAGNOSIS — G894 Chronic pain syndrome: Secondary | ICD-10-CM

## 2018-03-17 DIAGNOSIS — A0472 Enterocolitis due to Clostridium difficile, not specified as recurrent: Secondary | ICD-10-CM

## 2018-03-17 DIAGNOSIS — Z87891 Personal history of nicotine dependence: Secondary | ICD-10-CM

## 2018-03-17 DIAGNOSIS — Z6841 Body Mass Index (BMI) 40.0 and over, adult: Secondary | ICD-10-CM

## 2018-03-17 DIAGNOSIS — E1165 Type 2 diabetes mellitus with hyperglycemia: Secondary | ICD-10-CM

## 2018-03-17 DIAGNOSIS — R350 Frequency of micturition: Secondary | ICD-10-CM

## 2018-03-17 DIAGNOSIS — L97511 Non-pressure chronic ulcer of other part of right foot limited to breakdown of skin: Secondary | ICD-10-CM

## 2018-03-17 DIAGNOSIS — E039 Hypothyroidism, unspecified: Secondary | ICD-10-CM

## 2018-03-17 DIAGNOSIS — F319 Bipolar disorder, unspecified: Secondary | ICD-10-CM

## 2018-03-17 DIAGNOSIS — J449 Chronic obstructive pulmonary disease, unspecified: Secondary | ICD-10-CM

## 2018-03-17 DIAGNOSIS — L97421 Non-pressure chronic ulcer of left heel and midfoot limited to breakdown of skin: Secondary | ICD-10-CM

## 2018-03-17 DIAGNOSIS — F5105 Insomnia due to other mental disorder: Secondary | ICD-10-CM

## 2018-03-20 ENCOUNTER — Telehealth: Payer: Self-pay | Admitting: Nurse Practitioner

## 2018-03-20 DIAGNOSIS — I5032 Chronic diastolic (congestive) heart failure: Secondary | ICD-10-CM

## 2018-03-20 DIAGNOSIS — A0472 Enterocolitis due to Clostridium difficile, not specified as recurrent: Secondary | ICD-10-CM

## 2018-03-20 DIAGNOSIS — E1142 Type 2 diabetes mellitus with diabetic polyneuropathy: Secondary | ICD-10-CM

## 2018-03-20 DIAGNOSIS — J449 Chronic obstructive pulmonary disease, unspecified: Secondary | ICD-10-CM

## 2018-03-20 DIAGNOSIS — F419 Anxiety disorder, unspecified: Secondary | ICD-10-CM

## 2018-03-20 DIAGNOSIS — Z794 Long term (current) use of insulin: Secondary | ICD-10-CM

## 2018-03-20 DIAGNOSIS — L97511 Non-pressure chronic ulcer of other part of right foot limited to breakdown of skin: Secondary | ICD-10-CM

## 2018-03-20 DIAGNOSIS — F319 Bipolar disorder, unspecified: Secondary | ICD-10-CM

## 2018-03-20 DIAGNOSIS — E559 Vitamin D deficiency, unspecified: Secondary | ICD-10-CM

## 2018-03-20 DIAGNOSIS — G4733 Obstructive sleep apnea (adult) (pediatric): Secondary | ICD-10-CM

## 2018-03-20 DIAGNOSIS — Z87891 Personal history of nicotine dependence: Secondary | ICD-10-CM

## 2018-03-20 DIAGNOSIS — E11621 Type 2 diabetes mellitus with foot ulcer: Principal | ICD-10-CM

## 2018-03-20 DIAGNOSIS — M519 Unspecified thoracic, thoracolumbar and lumbosacral intervertebral disc disorder: Secondary | ICD-10-CM

## 2018-03-20 DIAGNOSIS — M069 Rheumatoid arthritis, unspecified: Secondary | ICD-10-CM

## 2018-03-20 DIAGNOSIS — E1165 Type 2 diabetes mellitus with hyperglycemia: Secondary | ICD-10-CM

## 2018-03-20 DIAGNOSIS — F5105 Insomnia due to other mental disorder: Secondary | ICD-10-CM

## 2018-03-20 DIAGNOSIS — G894 Chronic pain syndrome: Secondary | ICD-10-CM

## 2018-03-20 DIAGNOSIS — L97411 Non-pressure chronic ulcer of right heel and midfoot limited to breakdown of skin: Secondary | ICD-10-CM

## 2018-03-20 DIAGNOSIS — R350 Frequency of micturition: Secondary | ICD-10-CM

## 2018-03-20 DIAGNOSIS — I110 Hypertensive heart disease with heart failure: Secondary | ICD-10-CM

## 2018-03-20 DIAGNOSIS — Z6841 Body Mass Index (BMI) 40.0 and over, adult: Secondary | ICD-10-CM

## 2018-03-20 DIAGNOSIS — E039 Hypothyroidism, unspecified: Secondary | ICD-10-CM

## 2018-03-20 DIAGNOSIS — N3946 Mixed incontinence: Secondary | ICD-10-CM

## 2018-03-20 DIAGNOSIS — L97421 Non-pressure chronic ulcer of left heel and midfoot limited to breakdown of skin: Secondary | ICD-10-CM

## 2018-03-20 NOTE — Telephone Encounter (Signed)
Last visit 11/2017. Needs appt. Patient notified.

## 2018-03-20 NOTE — Telephone Encounter (Signed)
Pt is out of lyrica and said that the pharmacy needs prior auth to fill it she uses [facility]

## 2018-03-21 DIAGNOSIS — Z87891 Personal history of nicotine dependence: Secondary | ICD-10-CM

## 2018-03-21 DIAGNOSIS — E559 Vitamin D deficiency, unspecified: Secondary | ICD-10-CM

## 2018-03-21 DIAGNOSIS — F5105 Insomnia due to other mental disorder: Secondary | ICD-10-CM

## 2018-03-21 DIAGNOSIS — M069 Rheumatoid arthritis, unspecified: Secondary | ICD-10-CM

## 2018-03-21 DIAGNOSIS — G894 Chronic pain syndrome: Secondary | ICD-10-CM

## 2018-03-21 DIAGNOSIS — I5032 Chronic diastolic (congestive) heart failure: Secondary | ICD-10-CM

## 2018-03-21 DIAGNOSIS — M519 Unspecified thoracic, thoracolumbar and lumbosacral intervertebral disc disorder: Secondary | ICD-10-CM

## 2018-03-21 DIAGNOSIS — F419 Anxiety disorder, unspecified: Secondary | ICD-10-CM

## 2018-03-21 DIAGNOSIS — E039 Hypothyroidism, unspecified: Secondary | ICD-10-CM

## 2018-03-21 DIAGNOSIS — F319 Bipolar disorder, unspecified: Secondary | ICD-10-CM

## 2018-03-21 DIAGNOSIS — E1142 Type 2 diabetes mellitus with diabetic polyneuropathy: Secondary | ICD-10-CM

## 2018-03-21 DIAGNOSIS — L97421 Non-pressure chronic ulcer of left heel and midfoot limited to breakdown of skin: Secondary | ICD-10-CM

## 2018-03-21 DIAGNOSIS — I110 Hypertensive heart disease with heart failure: Secondary | ICD-10-CM

## 2018-03-21 DIAGNOSIS — G4733 Obstructive sleep apnea (adult) (pediatric): Secondary | ICD-10-CM

## 2018-03-21 DIAGNOSIS — E11621 Type 2 diabetes mellitus with foot ulcer: Principal | ICD-10-CM

## 2018-03-21 DIAGNOSIS — J449 Chronic obstructive pulmonary disease, unspecified: Secondary | ICD-10-CM

## 2018-03-21 DIAGNOSIS — N3946 Mixed incontinence: Secondary | ICD-10-CM

## 2018-03-21 DIAGNOSIS — E1165 Type 2 diabetes mellitus with hyperglycemia: Secondary | ICD-10-CM

## 2018-03-21 DIAGNOSIS — R350 Frequency of micturition: Secondary | ICD-10-CM

## 2018-03-21 DIAGNOSIS — Z794 Long term (current) use of insulin: Secondary | ICD-10-CM

## 2018-03-21 DIAGNOSIS — L97411 Non-pressure chronic ulcer of right heel and midfoot limited to breakdown of skin: Secondary | ICD-10-CM

## 2018-03-21 DIAGNOSIS — Z6841 Body Mass Index (BMI) 40.0 and over, adult: Secondary | ICD-10-CM

## 2018-03-21 DIAGNOSIS — A0472 Enterocolitis due to Clostridium difficile, not specified as recurrent: Secondary | ICD-10-CM

## 2018-03-21 DIAGNOSIS — L97511 Non-pressure chronic ulcer of other part of right foot limited to breakdown of skin: Secondary | ICD-10-CM

## 2018-03-22 ENCOUNTER — Ambulatory Visit: Admit: 2018-03-22 | Discharge: 2018-03-23 | Payer: MEDICAID | Attending: Family | Primary: Family

## 2018-03-22 DIAGNOSIS — M0579 Rheumatoid arthritis with rheumatoid factor of multiple sites without organ or systems involvement: Secondary | ICD-10-CM

## 2018-03-22 DIAGNOSIS — M869 Osteomyelitis, unspecified: Secondary | ICD-10-CM

## 2018-03-22 DIAGNOSIS — E1169 Type 2 diabetes mellitus with other specified complication: Principal | ICD-10-CM

## 2018-03-22 DIAGNOSIS — G629 Polyneuropathy, unspecified: Secondary | ICD-10-CM

## 2018-03-22 DIAGNOSIS — F331 Major depressive disorder, recurrent, moderate: Secondary | ICD-10-CM

## 2018-03-22 DIAGNOSIS — E039 Hypothyroidism, unspecified: Secondary | ICD-10-CM

## 2018-03-23 ENCOUNTER — Encounter: Payer: Self-pay | Admitting: Nurse Practitioner

## 2018-03-23 ENCOUNTER — Ambulatory Visit: Payer: Medicaid Other | Attending: Nurse Practitioner | Admitting: Nurse Practitioner

## 2018-03-23 ENCOUNTER — Other Ambulatory Visit: Payer: Self-pay

## 2018-03-23 VITALS — BP 109/86 | HR 80 | Temp 98.7°F | Ht 66.0 in | Wt 310.0 lb

## 2018-03-23 DIAGNOSIS — Z885 Allergy status to narcotic agent status: Secondary | ICD-10-CM | POA: Insufficient documentation

## 2018-03-23 DIAGNOSIS — Z9884 Bariatric surgery status: Secondary | ICD-10-CM | POA: Insufficient documentation

## 2018-03-23 DIAGNOSIS — M069 Rheumatoid arthritis, unspecified: Secondary | ICD-10-CM | POA: Diagnosis not present

## 2018-03-23 DIAGNOSIS — Z6841 Body Mass Index (BMI) 40.0 and over, adult: Secondary | ICD-10-CM | POA: Insufficient documentation

## 2018-03-23 DIAGNOSIS — F419 Anxiety disorder, unspecified: Secondary | ICD-10-CM | POA: Insufficient documentation

## 2018-03-23 DIAGNOSIS — E1142 Type 2 diabetes mellitus with diabetic polyneuropathy: Secondary | ICD-10-CM

## 2018-03-23 DIAGNOSIS — L97519 Non-pressure chronic ulcer of other part of right foot with unspecified severity: Secondary | ICD-10-CM | POA: Insufficient documentation

## 2018-03-23 DIAGNOSIS — F329 Major depressive disorder, single episode, unspecified: Secondary | ICD-10-CM | POA: Insufficient documentation

## 2018-03-23 DIAGNOSIS — E1169 Type 2 diabetes mellitus with other specified complication: Secondary | ICD-10-CM | POA: Diagnosis not present

## 2018-03-23 DIAGNOSIS — G894 Chronic pain syndrome: Secondary | ICD-10-CM | POA: Diagnosis not present

## 2018-03-23 DIAGNOSIS — Z882 Allergy status to sulfonamides status: Secondary | ICD-10-CM | POA: Insufficient documentation

## 2018-03-23 DIAGNOSIS — M79673 Pain in unspecified foot: Secondary | ICD-10-CM | POA: Diagnosis present

## 2018-03-23 DIAGNOSIS — M25512 Pain in left shoulder: Secondary | ICD-10-CM | POA: Insufficient documentation

## 2018-03-23 DIAGNOSIS — Z794 Long term (current) use of insulin: Secondary | ICD-10-CM | POA: Insufficient documentation

## 2018-03-23 DIAGNOSIS — E11621 Type 2 diabetes mellitus with foot ulcer: Secondary | ICD-10-CM | POA: Diagnosis not present

## 2018-03-23 DIAGNOSIS — G4700 Insomnia, unspecified: Secondary | ICD-10-CM | POA: Insufficient documentation

## 2018-03-23 DIAGNOSIS — M542 Cervicalgia: Secondary | ICD-10-CM | POA: Insufficient documentation

## 2018-03-23 DIAGNOSIS — Z87891 Personal history of nicotine dependence: Secondary | ICD-10-CM | POA: Insufficient documentation

## 2018-03-23 DIAGNOSIS — M869 Osteomyelitis, unspecified: Secondary | ICD-10-CM | POA: Insufficient documentation

## 2018-03-23 DIAGNOSIS — K589 Irritable bowel syndrome without diarrhea: Secondary | ICD-10-CM | POA: Insufficient documentation

## 2018-03-23 DIAGNOSIS — E785 Hyperlipidemia, unspecified: Secondary | ICD-10-CM | POA: Insufficient documentation

## 2018-03-23 DIAGNOSIS — E559 Vitamin D deficiency, unspecified: Secondary | ICD-10-CM | POA: Insufficient documentation

## 2018-03-23 DIAGNOSIS — Z79899 Other long term (current) drug therapy: Secondary | ICD-10-CM | POA: Insufficient documentation

## 2018-03-23 DIAGNOSIS — I110 Hypertensive heart disease with heart failure: Secondary | ICD-10-CM | POA: Diagnosis not present

## 2018-03-23 DIAGNOSIS — M25511 Pain in right shoulder: Secondary | ICD-10-CM | POA: Insufficient documentation

## 2018-03-23 DIAGNOSIS — G4733 Obstructive sleep apnea (adult) (pediatric): Secondary | ICD-10-CM | POA: Insufficient documentation

## 2018-03-23 DIAGNOSIS — K219 Gastro-esophageal reflux disease without esophagitis: Secondary | ICD-10-CM | POA: Diagnosis not present

## 2018-03-23 DIAGNOSIS — L97422 Non-pressure chronic ulcer of left heel and midfoot with fat layer exposed: Secondary | ICD-10-CM | POA: Insufficient documentation

## 2018-03-23 DIAGNOSIS — M1990 Unspecified osteoarthritis, unspecified site: Secondary | ICD-10-CM | POA: Insufficient documentation

## 2018-03-23 DIAGNOSIS — M792 Neuralgia and neuritis, unspecified: Secondary | ICD-10-CM

## 2018-03-23 DIAGNOSIS — M25532 Pain in left wrist: Secondary | ICD-10-CM | POA: Insufficient documentation

## 2018-03-23 DIAGNOSIS — Z5181 Encounter for therapeutic drug level monitoring: Secondary | ICD-10-CM | POA: Insufficient documentation

## 2018-03-23 DIAGNOSIS — I5032 Chronic diastolic (congestive) heart failure: Secondary | ICD-10-CM | POA: Diagnosis not present

## 2018-03-23 DIAGNOSIS — M25531 Pain in right wrist: Secondary | ICD-10-CM | POA: Insufficient documentation

## 2018-03-23 DIAGNOSIS — N393 Stress incontinence (female) (male): Secondary | ICD-10-CM | POA: Insufficient documentation

## 2018-03-23 DIAGNOSIS — F603 Borderline personality disorder: Secondary | ICD-10-CM | POA: Insufficient documentation

## 2018-03-23 DIAGNOSIS — E039 Hypothyroidism, unspecified: Secondary | ICD-10-CM | POA: Insufficient documentation

## 2018-03-23 DIAGNOSIS — Z79891 Long term (current) use of opiate analgesic: Secondary | ICD-10-CM | POA: Diagnosis not present

## 2018-03-23 DIAGNOSIS — Z79890 Hormone replacement therapy (postmenopausal): Secondary | ICD-10-CM | POA: Diagnosis not present

## 2018-03-23 MED ORDER — OXYCODONE HCL 5 MG PO TABS: 5.0000 mg | ORAL_TABLET | Freq: Four times a day (QID) | ORAL | 0 refills | Status: DC | PRN

## 2018-03-23 MED ORDER — PREGABALIN 150 MG PO CAPS: 150.0000 mg | ORAL_CAPSULE | Freq: Three times a day (TID) | ORAL | 0 refills | Status: DC

## 2018-03-23 NOTE — Progress Notes (Signed)
Nursing Pain Medication Assessment:  °Safety precautions to be maintained throughout the outpatient stay will include: orient to surroundings, keep bed in low position, maintain call bell within reach at all times, provide assistance with transfer out of bed and ambulation.  °Medication Inspection Compliance: [name] [name] did not comply with our request to bring her pills to be counted. She was reminded that bringing the medication bottles, even when empty, is a requirement. ° °Medication: None brought in. °Pill/Patch Count: None available to be counted. °Bottle Appearance: No container available. Did not bring bottle(s) to appointment. °Filled Date: N/A °Last Medication intake:  Today °

## 2018-03-23 NOTE — Progress Notes (Signed)
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 Appointments  Date Time Provider [place]  06/19/2018 11:30 AM Vevelyn Francois, NP [facility] None   Primary Care Physician: Care, [place] Primary Location: [facility] Out[facility] Facility Note by: Vevelyn Francois NP Date: 03/23/2018; Time: 12:40 PM  Pain Score Disclaimer: We use the NRS-11 scale. This is a self-reported, subjective measurement of pain severity with only modest accuracy. It is used primarily to identify changes within a particular patient. It must be understood that outpatient pain scales are significantly less accurate that those used for research, where they can be applied under ideal controlled circumstances with minimal exposure to variables. In reality, the score is likely to be a combination of pain intensity and pain affect, where pain affect describes the degree of emotional arousal or changes in action readiness caused by the sensory experience of pain.  Factors such as social and work situation, setting, emotional state, anxiety levels, expectation, and prior pain experience may influence pain perception and show large inter-individual differences that may also be affected by time variables.  Patient instructions provided during this appointment: Patient Instructions   ____________________________________________________________________________________________  Medication Rules  Purpose: To inform patients, and their family members, of our rules and regulations.  Applies to: All patients receiving prescriptions (written or electronic).  Pharmacy of record: Pharmacy where electronic prescriptions will be sent. If written prescriptions are taken to a different pharmacy, please inform the nursing staff. The pharmacy listed in the electronic medical record should be the one where you would like electronic prescriptions to be sent.  Electronic prescriptions: In compliance with the [place] (STOP) Act of 2017 (Session Lanny Cramp [phone]), effective April 19, 2018, all controlled substances must be electronically prescribed. Calling prescriptions to the pharmacy will cease to exist.  Prescription refills: Only during scheduled appointments.  Applies to all prescriptions.  NOTE: The following applies primarily to controlled substances (Opioid* Pain Medications).   Patient's responsibilities: 1. Pain Pills: Bring all pain pills to every appointment (except for procedure appointments). 2. Pill Bottles: Bring pills in original pharmacy bottle. Always bring the newest bottle. Bring bottle, even if empty. 3. Medication refills: You are responsible for knowing and keeping track of what medications you take and those you need refilled. The day before your appointment: write a list of all prescriptions that need to be refilled. The day of the appointment: give the list to the admitting nurse. Prescriptions will be  written only during appointments. If you forget a medication: it will not be "Called in", "Faxed", or "electronically sent". You will need to get another appointment to get these prescribed. No early refills. Do not call asking to have your prescription filled early. 4. Prescription Accuracy: You are responsible for carefully inspecting your prescriptions before leaving our office. Have the discharge nurse carefully go over each prescription with you, before taking them home. Make sure that your name is accurately spelled, that your address is correct. Check the name and dose of your medication to make sure it is accurate. Check the number of pills, and the written instructions to make sure they are clear and accurate. Make sure that you are given enough medication to last until your next medication refill appointment. 5. Taking Medication: Take medication as prescribed. When it comes to controlled substances, taking less pills or less frequently than prescribed is permitted and encouraged. Never take more pills than instructed. Never take medication more frequently than prescribed.  6. Inform other Doctors: Always inform, all of your healthcare providers, of all the medications you take. 7. Pain Medication from other Providers: You are not allowed to accept any additional pain medication from any other Doctor or Healthcare provider. There are two exceptions to this rule. (see below) In the event that you require additional pain medication, you are responsible for notifying us, as stated below. 8. Medication Agreement: You are responsible for carefully reading and following our Medication Agreement. This must be signed before receiving any prescriptions from our practice. Safely store a copy of your signed Agreement. Violations to the Agreement will result in no further prescriptions. (Additional copies of our Medication Agreement are available upon request.) 9. Laws, Rules, & Regulations: All patients are  expected to follow all Federal and [employer], Rules, [employer]. Ignorance of the Laws does not constitute a valid excuse. The use of any illegal substances is prohibited. 10. Adopted CDC guidelines & recommendations: Target dosing levels will be at or below 60 MME/day. Use of benzodiazepines** is not recommended.  Exceptions: There are only two exceptions to the rule of not receiving pain medications from other Healthcare Providers. 1. Exception #1 (Emergencies): In the event of an emergency (i.e.: accident requiring emergency care), you are allowed to receive additional pain medication. However, you are responsible for: As soon as you are able, call our office [phone], at any time of the day or night, and leave a message stating your name, the date and nature of the emergency, and the name and dose of the medication prescribed. In the event that your call is answered by a member of our staff, make sure to document and save the date, time, and the name of the person that took your information.  2. Exception #2 (Planned Surgery): In the event that you are scheduled by another doctor or dentist to  have any type of surgery or procedure, you are allowed (for a period no longer than 30 days), to receive additional pain medication, for the acute post-op pain. However, in this case, you are responsible for picking up a copy of our "Post-op Pain Management for Surgeons" handout, and giving it to your surgeon or dentist. This document is available at our office, and does not require an appointment to obtain it. Simply go to our office during business hours (Monday-Thursday from 8:00 AM to 4:00 PM) (Friday 8:00 AM to 12:00 Noon) or if you have a scheduled appointment with [name], prior to your surgery, and ask for it by name. In addition, you will need to provide [name] with your name, name of your surgeon, type of surgery, and date of procedure or surgery.  *Opioid medications include: morphine, codeine,  oxycodone, oxymorphone, hydrocodone, hydromorphone, meperidine, tramadol, tapentadol, buprenorphine, fentanyl, methadone. **Benzodiazepine medications include: diazepam (Valium), alprazolam (Xanax), clonazepam (Klonopine), lorazepam (Ativan), clorazepate (Tranxene), chlordiazepoxide (Librium), estazolam (Prosom), oxazepam (Serax), temazepam (Restoril), triazolam (Halcion) (Last updated: 06/16/2017) ____________________________________________________________________________________________    BMI Assessment: Estimated body mass index is 50.04 kg/m as calculated from the following:   Height as of this encounter: 5' 6"  (1.676 m).   Weight as of this encounter: 310 lb (140.6 kg).  BMI interpretation table: BMI level Category Range association with higher incidence of chronic pain  <18 kg/m2 Underweight   18.5-24.9 kg/m2 Ideal body weight   25-29.9 kg/m2 Overweight Increased incidence by 20%  30-34.9 kg/m2 Obese (Class I) Increased incidence by 68%  35-39.9 kg/m2 Severe obesity (Class II) Increased incidence by 136%  >40 kg/m2 Extreme obesity (Class III) Increased incidence by 254%   Patient's current BMI Ideal Body weight  Body mass index is 50.04 kg/m. Ideal body weight: 59.3 kg (130 lb 11.7 oz) Adjusted ideal body weight: 91.8 kg (202 lb 7 oz)   BMI Readings from Last 4 Encounters:  03/23/18 50.04 kg/m  12/15/17 51.65 kg/m  11/14/17 52.28 kg/m  11/01/17 52.28 kg/m   Wt Readings from Last 4 Encounters:  03/23/18 (!) 310 lb (140.6 kg)  12/15/17 (!) 320 lb (145.2 kg)  11/14/17 (!) 319 lb (144.7 kg)  11/01/17 (!) 319 lb (144.7 kg)

## 2018-03-23 NOTE — Patient Instructions (Addendum)
____________________________________________________________________________________________  Medication Rules  Purpose: To inform patients, and their family members, of our rules and regulations.  Applies to: All patients receiving prescriptions (written or electronic).  Pharmacy of record: Pharmacy where electronic prescriptions will be sent. If written prescriptions are taken to a different pharmacy, please inform the nursing staff. The pharmacy listed in the electronic medical record should be the one where you would like electronic prescriptions to be sent.  Electronic prescriptions: In compliance with the [place] Strengthen Opioid Misuse Prevention (STOP) Act of 2017 (Session Law 2017-74/H243), effective April 19, 2018, all controlled substances must be electronically prescribed. Calling prescriptions to the pharmacy will cease to exist.  Prescription refills: Only during scheduled appointments. Applies to all prescriptions.  NOTE: The following applies primarily to controlled substances (Opioid* Pain Medications).   Patient's responsibilities: 1. Pain Pills: Bring all pain pills to every appointment (except for procedure appointments). 2. Pill Bottles: Bring pills in original pharmacy bottle. Always bring the newest bottle. Bring bottle, even if empty. 3. Medication refills: You are responsible for knowing and keeping track of what medications you take and those you need refilled. The day before your appointment: write a list of all prescriptions that need to be refilled. The day of the appointment: give the list to the admitting nurse. Prescriptions will be written only during appointments. If you forget a medication: it will not be "Called in", "Faxed", or "electronically sent". You will need to get another appointment to get these prescribed. No early refills. Do not call asking to have your prescription filled early. 4. Prescription Accuracy: You are responsible for  carefully inspecting your prescriptions before leaving our office. Have the discharge nurse carefully go over each prescription with you, before taking them home. Make sure that your name is accurately spelled, that your address is correct. Check the name and dose of your medication to make sure it is accurate. Check the number of pills, and the written instructions to make sure they are clear and accurate. Make sure that you are given enough medication to last until your next medication refill appointment. 5. Taking Medication: Take medication as prescribed. When it comes to controlled substances, taking less pills or less frequently than prescribed is permitted and encouraged. Never take more pills than instructed. Never take medication more frequently than prescribed.  6. Inform other Doctors: Always inform, all of your healthcare providers, of all the medications you take. 7. Pain Medication from other Providers: You are not allowed to accept any additional pain medication from any other Doctor or Healthcare provider. There are two exceptions to this rule. (see below) In the event that you require additional pain medication, you are responsible for notifying us, as stated below. 8. Medication Agreement: You are responsible for carefully reading and following our Medication Agreement. This must be signed before receiving any prescriptions from our practice. Safely store a copy of your signed Agreement. Violations to the Agreement will result in no further prescriptions. (Additional copies of our Medication Agreement are available upon request.) 9. Laws, Rules, & Regulations: All patients are expected to follow all Federal and State Laws, Statutes, Rules, & Regulations. Ignorance of the Laws does not constitute a valid excuse. The use of any illegal substances is prohibited. 10. Adopted CDC guidelines & recommendations: Target dosing levels will be at or below 60 MME/day. Use of benzodiazepines** is not  recommended.  Exceptions: There are only two exceptions to the rule of not receiving pain medications from other Healthcare Providers. 1.   Exception #1 (Emergencies): In the event of an emergency (i.e.: accident requiring emergency care), you are allowed to receive additional pain medication. However, you are responsible for: As soon as you are able, call our office [phone], at any time of the day or night, and leave a message stating your name, the date and nature of the emergency, and the name and dose of the medication prescribed. In the event that your call is answered by a member of our staff, make sure to document and save the date, time, and the name of the person that took your information.  2. Exception #2 (Planned Surgery): In the event that you are scheduled by another doctor or dentist to have any type of surgery or procedure, you are allowed (for a period no longer than 30 days), to receive additional pain medication, for the acute post-op pain. However, in this case, you are responsible for picking up a copy of our "Post-op Pain Management for Surgeons" handout, and giving it to your surgeon or dentist. This document is available at our office, and does not require an appointment to obtain it. Simply go to our office during business hours (Monday-Thursday from 8:00 AM to 4:00 PM) (Friday 8:00 AM to 12:00 Noon) or if you have a scheduled appointment with [name], prior to your surgery, and ask for it by name. In addition, you will need to provide [name] with your name, name of your surgeon, type of surgery, and date of procedure or surgery.  *Opioid medications include: morphine, codeine, oxycodone, oxymorphone, hydrocodone, hydromorphone, meperidine, tramadol, tapentadol, buprenorphine, fentanyl, methadone. **Benzodiazepine medications include: diazepam (Valium), alprazolam (Xanax), clonazepam (Klonopine), lorazepam (Ativan), clorazepate (Tranxene), chlordiazepoxide (Librium), estazolam (Prosom),  oxazepam (Serax), temazepam (Restoril), triazolam (Halcion) (Last updated: 06/16/2017) ____________________________________________________________________________________________    BMI Assessment: Estimated body mass index is 50.04 kg/m as calculated from the following:   Height as of this encounter: 5\' 6"  (1.676 m).   Weight as of this encounter: 310 lb (140.6 kg).  BMI interpretation table: BMI level Category Range association with higher incidence of chronic pain  <18 kg/m2 Underweight   18.5-24.9 kg/m2 Ideal body weight   25-29.9 kg/m2 Overweight Increased incidence by 20%  30-34.9 kg/m2 Obese (Class I) Increased incidence by 68%  35-39.9 kg/m2 Severe obesity (Class II) Increased incidence by 136%  >40 kg/m2 Extreme obesity (Class III) Increased incidence by 254%   Patient's current BMI Ideal Body weight  Body mass index is 50.04 kg/m. Ideal body weight: 59.3 kg (130 lb 11.7 oz) Adjusted ideal body weight: 91.8 kg (202 lb 7 oz)   BMI Readings from Last 4 Encounters:  03/23/18 50.04 kg/m  12/15/17 51.65 kg/m  11/14/17 52.28 kg/m  11/01/17 52.28 kg/m   Wt Readings from Last 4 Encounters:  03/23/18 (!) 310 lb (140.6 kg)  12/15/17 (!) 320 lb (145.2 kg)  11/14/17 (!) 319 lb (144.7 kg)  11/01/17 (!) 319 lb (144.7 kg)

## 2018-03-24 DIAGNOSIS — E1165 Type 2 diabetes mellitus with hyperglycemia: Secondary | ICD-10-CM

## 2018-03-24 DIAGNOSIS — L97411 Non-pressure chronic ulcer of right heel and midfoot limited to breakdown of skin: Secondary | ICD-10-CM

## 2018-03-24 DIAGNOSIS — G894 Chronic pain syndrome: Secondary | ICD-10-CM

## 2018-03-24 DIAGNOSIS — Z794 Long term (current) use of insulin: Secondary | ICD-10-CM

## 2018-03-24 DIAGNOSIS — E039 Hypothyroidism, unspecified: Secondary | ICD-10-CM

## 2018-03-24 DIAGNOSIS — A0472 Enterocolitis due to Clostridium difficile, not specified as recurrent: Secondary | ICD-10-CM

## 2018-03-24 DIAGNOSIS — M519 Unspecified thoracic, thoracolumbar and lumbosacral intervertebral disc disorder: Secondary | ICD-10-CM

## 2018-03-24 DIAGNOSIS — L97421 Non-pressure chronic ulcer of left heel and midfoot limited to breakdown of skin: Secondary | ICD-10-CM

## 2018-03-24 DIAGNOSIS — F419 Anxiety disorder, unspecified: Secondary | ICD-10-CM

## 2018-03-24 DIAGNOSIS — L97511 Non-pressure chronic ulcer of other part of right foot limited to breakdown of skin: Secondary | ICD-10-CM

## 2018-03-24 DIAGNOSIS — Z87891 Personal history of nicotine dependence: Secondary | ICD-10-CM

## 2018-03-24 DIAGNOSIS — R350 Frequency of micturition: Secondary | ICD-10-CM

## 2018-03-24 DIAGNOSIS — N3946 Mixed incontinence: Secondary | ICD-10-CM

## 2018-03-24 DIAGNOSIS — I110 Hypertensive heart disease with heart failure: Secondary | ICD-10-CM

## 2018-03-24 DIAGNOSIS — E1142 Type 2 diabetes mellitus with diabetic polyneuropathy: Secondary | ICD-10-CM

## 2018-03-24 DIAGNOSIS — G4733 Obstructive sleep apnea (adult) (pediatric): Secondary | ICD-10-CM

## 2018-03-24 DIAGNOSIS — J449 Chronic obstructive pulmonary disease, unspecified: Secondary | ICD-10-CM

## 2018-03-24 DIAGNOSIS — F319 Bipolar disorder, unspecified: Secondary | ICD-10-CM

## 2018-03-24 DIAGNOSIS — Z6841 Body Mass Index (BMI) 40.0 and over, adult: Secondary | ICD-10-CM

## 2018-03-24 DIAGNOSIS — E11621 Type 2 diabetes mellitus with foot ulcer: Principal | ICD-10-CM

## 2018-03-24 DIAGNOSIS — I5032 Chronic diastolic (congestive) heart failure: Secondary | ICD-10-CM

## 2018-03-24 DIAGNOSIS — E559 Vitamin D deficiency, unspecified: Secondary | ICD-10-CM

## 2018-03-24 DIAGNOSIS — F5105 Insomnia due to other mental disorder: Secondary | ICD-10-CM

## 2018-03-24 DIAGNOSIS — M069 Rheumatoid arthritis, unspecified: Secondary | ICD-10-CM

## 2018-03-26 ENCOUNTER — Ambulatory Visit
Admit: 2018-03-26 | Discharge: 2018-04-04 | Disposition: A | Discharge status: Home / Self Care | Payer: MEDICAID | Admitting: Vascular Surgery

## 2018-03-26 ENCOUNTER — Encounter
Admit: 2018-03-26 | Discharge: 2018-04-04 | Disposition: A | Discharge status: Home / Self Care | Payer: MEDICAID | Attending: Certified Registered" | Admitting: Vascular Surgery

## 2018-03-26 DIAGNOSIS — M79671 Pain in right foot: Principal | ICD-10-CM

## 2018-03-27 ENCOUNTER — Other Ambulatory Visit: Payer: Self-pay | Admitting: Psychiatry

## 2018-03-27 DIAGNOSIS — L97421 Non-pressure chronic ulcer of left heel and midfoot limited to breakdown of skin: Secondary | ICD-10-CM

## 2018-03-27 DIAGNOSIS — R350 Frequency of micturition: Secondary | ICD-10-CM

## 2018-03-27 DIAGNOSIS — I5032 Chronic diastolic (congestive) heart failure: Secondary | ICD-10-CM

## 2018-03-27 DIAGNOSIS — A0472 Enterocolitis due to Clostridium difficile, not specified as recurrent: Secondary | ICD-10-CM

## 2018-03-27 DIAGNOSIS — G894 Chronic pain syndrome: Secondary | ICD-10-CM

## 2018-03-27 DIAGNOSIS — E559 Vitamin D deficiency, unspecified: Secondary | ICD-10-CM

## 2018-03-27 DIAGNOSIS — F419 Anxiety disorder, unspecified: Secondary | ICD-10-CM

## 2018-03-27 DIAGNOSIS — E1142 Type 2 diabetes mellitus with diabetic polyneuropathy: Secondary | ICD-10-CM

## 2018-03-27 DIAGNOSIS — F5105 Insomnia due to other mental disorder: Secondary | ICD-10-CM

## 2018-03-27 DIAGNOSIS — E1165 Type 2 diabetes mellitus with hyperglycemia: Secondary | ICD-10-CM

## 2018-03-27 DIAGNOSIS — M519 Unspecified thoracic, thoracolumbar and lumbosacral intervertebral disc disorder: Secondary | ICD-10-CM

## 2018-03-27 DIAGNOSIS — Z87891 Personal history of nicotine dependence: Secondary | ICD-10-CM

## 2018-03-27 DIAGNOSIS — E039 Hypothyroidism, unspecified: Secondary | ICD-10-CM

## 2018-03-27 DIAGNOSIS — G4733 Obstructive sleep apnea (adult) (pediatric): Secondary | ICD-10-CM

## 2018-03-27 DIAGNOSIS — Z794 Long term (current) use of insulin: Secondary | ICD-10-CM

## 2018-03-27 DIAGNOSIS — E11621 Type 2 diabetes mellitus with foot ulcer: Principal | ICD-10-CM

## 2018-03-27 DIAGNOSIS — F319 Bipolar disorder, unspecified: Secondary | ICD-10-CM

## 2018-03-27 DIAGNOSIS — L97511 Non-pressure chronic ulcer of other part of right foot limited to breakdown of skin: Secondary | ICD-10-CM

## 2018-03-27 DIAGNOSIS — M069 Rheumatoid arthritis, unspecified: Secondary | ICD-10-CM

## 2018-03-27 DIAGNOSIS — J449 Chronic obstructive pulmonary disease, unspecified: Secondary | ICD-10-CM

## 2018-03-27 DIAGNOSIS — N3946 Mixed incontinence: Secondary | ICD-10-CM

## 2018-03-27 DIAGNOSIS — I110 Hypertensive heart disease with heart failure: Secondary | ICD-10-CM

## 2018-03-27 DIAGNOSIS — L97411 Non-pressure chronic ulcer of right heel and midfoot limited to breakdown of skin: Secondary | ICD-10-CM

## 2018-03-27 DIAGNOSIS — Z6841 Body Mass Index (BMI) 40.0 and over, adult: Secondary | ICD-10-CM

## 2018-03-28 DIAGNOSIS — M79671 Pain in right foot: Principal | ICD-10-CM

## 2018-03-28 LAB — TOXASSURE SELECT 13 (MW), URINE

## 2018-03-29 DIAGNOSIS — M79671 Pain in right foot: Principal | ICD-10-CM

## 2018-03-31 ENCOUNTER — Telehealth: Payer: Self-pay | Admitting: Nurse Practitioner

## 2018-03-31 NOTE — Telephone Encounter (Signed)
Patient is currently in hospital but will be out by Monday and has no Lyrica she says it needs prior auth ?

## 2018-03-31 NOTE — Telephone Encounter (Signed)
PA sent via [place] with history

## 2018-04-03 MED ORDER — ONDANSETRON 4 MG DISINTEGRATING TABLET
ORAL_TABLET | Freq: Two times a day (BID) | ORAL | 0 refills | 0.00000 days | Status: CP | PRN
Start: 2018-04-03 — End: 2018-04-10

## 2018-04-03 MED ORDER — VANCOMYCIN 1500 MG IN 500 ML IVPB: 1500 mg | each | 0 refills | 0 days

## 2018-04-03 MED ORDER — OXYCODONE 5 MG TABLET
ORAL_TABLET | ORAL | 0 refills | 0 days | Status: CP | PRN
Start: 2018-04-03 — End: 2018-04-10

## 2018-04-03 MED ORDER — ERTAPENEM IVPB 1G 50 ML MINI-BAG PLUS
Freq: Every day | INTRAVENOUS | 0 refills | 0 days | Status: CP
Start: 2018-04-03 — End: 2018-04-17

## 2018-04-04 MED ORDER — INSULIN GLARGINE (U-100) 100 UNIT/ML SUBCUTANEOUS SOLUTION
Freq: Every day | SUBCUTANEOUS | 0 refills | 0.00000 days | Status: SS
Start: 2018-04-04 — End: 2018-06-13

## 2018-04-04 MED ORDER — PREDNISONE 10 MG TABLET
ORAL_TABLET | Freq: Every day | ORAL | 0 refills | 0.00000 days | Status: CP
Start: 2018-04-04 — End: 2018-04-12

## 2018-04-04 MED ORDER — VANCOMYCIN 1500 MG IN 500 ML IVPB
INTRAVENOUS | 0 refills | 0.00000 days | Status: CP
Start: 2018-04-04 — End: 2018-04-03

## 2018-04-04 MED ORDER — INSULIN LISPRO (U-100) 100 UNIT/ML SUBCUTANEOUS SOLUTION
Freq: Three times a day (TID) | SUBCUTANEOUS | 0 refills | 0.00000 days | Status: CP
Start: 2018-04-04 — End: 2018-05-30

## 2018-04-04 MED ORDER — INSULIN LISPRO (U-100) 100 UNIT/ML SUBCUTANEOUS SOLUTION: 15 [IU] | mL | Freq: Three times a day (TID) | 0 refills | 0 days | Status: AC

## 2018-04-05 DIAGNOSIS — Z792 Long term (current) use of antibiotics: Secondary | ICD-10-CM

## 2018-04-05 DIAGNOSIS — G4733 Obstructive sleep apnea (adult) (pediatric): Secondary | ICD-10-CM

## 2018-04-05 DIAGNOSIS — G894 Chronic pain syndrome: Secondary | ICD-10-CM

## 2018-04-05 DIAGNOSIS — E1142 Type 2 diabetes mellitus with diabetic polyneuropathy: Secondary | ICD-10-CM

## 2018-04-05 DIAGNOSIS — T8743 Infection of amputation stump, right lower extremity: Principal | ICD-10-CM

## 2018-04-05 DIAGNOSIS — Z6841 Body Mass Index (BMI) 40.0 and over, adult: Secondary | ICD-10-CM

## 2018-04-05 DIAGNOSIS — F5105 Insomnia due to other mental disorder: Secondary | ICD-10-CM

## 2018-04-05 DIAGNOSIS — M869 Osteomyelitis, unspecified: Secondary | ICD-10-CM

## 2018-04-05 DIAGNOSIS — I5032 Chronic diastolic (congestive) heart failure: Secondary | ICD-10-CM

## 2018-04-05 DIAGNOSIS — Z87891 Personal history of nicotine dependence: Secondary | ICD-10-CM

## 2018-04-05 DIAGNOSIS — F319 Bipolar disorder, unspecified: Secondary | ICD-10-CM

## 2018-04-05 DIAGNOSIS — E876 Hypokalemia: Secondary | ICD-10-CM

## 2018-04-05 DIAGNOSIS — I110 Hypertensive heart disease with heart failure: Secondary | ICD-10-CM

## 2018-04-05 DIAGNOSIS — M0579 Rheumatoid arthritis with rheumatoid factor of multiple sites without organ or systems involvement: Secondary | ICD-10-CM

## 2018-04-05 DIAGNOSIS — M519 Unspecified thoracic, thoracolumbar and lumbosacral intervertebral disc disorder: Secondary | ICD-10-CM

## 2018-04-05 DIAGNOSIS — E039 Hypothyroidism, unspecified: Secondary | ICD-10-CM

## 2018-04-05 DIAGNOSIS — L02611 Cutaneous abscess of right foot: Secondary | ICD-10-CM

## 2018-04-05 DIAGNOSIS — B9562 Methicillin resistant Staphylococcus aureus infection as the cause of diseases classified elsewhere: Secondary | ICD-10-CM

## 2018-04-05 DIAGNOSIS — Z5181 Encounter for therapeutic drug level monitoring: Secondary | ICD-10-CM

## 2018-04-05 DIAGNOSIS — F419 Anxiety disorder, unspecified: Secondary | ICD-10-CM

## 2018-04-05 DIAGNOSIS — E1169 Type 2 diabetes mellitus with other specified complication: Secondary | ICD-10-CM

## 2018-04-05 DIAGNOSIS — J449 Chronic obstructive pulmonary disease, unspecified: Secondary | ICD-10-CM

## 2018-04-05 DIAGNOSIS — F603 Borderline personality disorder: Secondary | ICD-10-CM

## 2018-04-05 DIAGNOSIS — B9629 Other Escherichia coli [E. coli] as the cause of diseases classified elsewhere: Secondary | ICD-10-CM

## 2018-04-06 ENCOUNTER — Telehealth: Payer: Self-pay

## 2018-04-06 ENCOUNTER — Other Ambulatory Visit: Admit: 2018-04-06 | Discharge: 2018-04-07 | Payer: MEDICAID

## 2018-04-06 DIAGNOSIS — F319 Bipolar disorder, unspecified: Secondary | ICD-10-CM

## 2018-04-06 DIAGNOSIS — L97411 Non-pressure chronic ulcer of right heel and midfoot limited to breakdown of skin: Secondary | ICD-10-CM

## 2018-04-06 DIAGNOSIS — E1142 Type 2 diabetes mellitus with diabetic polyneuropathy: Secondary | ICD-10-CM

## 2018-04-06 DIAGNOSIS — E1165 Type 2 diabetes mellitus with hyperglycemia: Secondary | ICD-10-CM

## 2018-04-06 DIAGNOSIS — G894 Chronic pain syndrome: Secondary | ICD-10-CM

## 2018-04-06 DIAGNOSIS — I110 Hypertensive heart disease with heart failure: Secondary | ICD-10-CM

## 2018-04-06 DIAGNOSIS — L97421 Non-pressure chronic ulcer of left heel and midfoot limited to breakdown of skin: Secondary | ICD-10-CM

## 2018-04-06 DIAGNOSIS — F5105 Insomnia due to other mental disorder: Secondary | ICD-10-CM

## 2018-04-06 DIAGNOSIS — Z4781 Encounter for orthopedic aftercare following surgical amputation: Principal | ICD-10-CM

## 2018-04-06 DIAGNOSIS — F419 Anxiety disorder, unspecified: Secondary | ICD-10-CM

## 2018-04-06 DIAGNOSIS — I5032 Chronic diastolic (congestive) heart failure: Secondary | ICD-10-CM

## 2018-04-06 DIAGNOSIS — L97511 Non-pressure chronic ulcer of other part of right foot limited to breakdown of skin: Secondary | ICD-10-CM

## 2018-04-06 DIAGNOSIS — A0472 Enterocolitis due to Clostridium difficile, not specified as recurrent: Secondary | ICD-10-CM

## 2018-04-06 DIAGNOSIS — Z87891 Personal history of nicotine dependence: Secondary | ICD-10-CM

## 2018-04-06 DIAGNOSIS — R350 Frequency of micturition: Secondary | ICD-10-CM

## 2018-04-06 DIAGNOSIS — Z794 Long term (current) use of insulin: Secondary | ICD-10-CM

## 2018-04-06 DIAGNOSIS — J449 Chronic obstructive pulmonary disease, unspecified: Secondary | ICD-10-CM

## 2018-04-06 DIAGNOSIS — Z6841 Body Mass Index (BMI) 40.0 and over, adult: Secondary | ICD-10-CM

## 2018-04-06 DIAGNOSIS — E039 Hypothyroidism, unspecified: Secondary | ICD-10-CM

## 2018-04-06 DIAGNOSIS — M069 Rheumatoid arthritis, unspecified: Secondary | ICD-10-CM

## 2018-04-06 DIAGNOSIS — M519 Unspecified thoracic, thoracolumbar and lumbosacral intervertebral disc disorder: Secondary | ICD-10-CM

## 2018-04-06 DIAGNOSIS — E559 Vitamin D deficiency, unspecified: Secondary | ICD-10-CM

## 2018-04-06 DIAGNOSIS — G4733 Obstructive sleep apnea (adult) (pediatric): Secondary | ICD-10-CM

## 2018-04-06 DIAGNOSIS — N3946 Mixed incontinence: Secondary | ICD-10-CM

## 2018-04-06 DIAGNOSIS — E11621 Type 2 diabetes mellitus with foot ulcer: Principal | ICD-10-CM

## 2018-04-06 NOTE — Telephone Encounter (Signed)
Pt called and she's been trying to get her Lyrica filled x1 month, she was here a couple weeks ago and someone was going to look into it for her. The pharmacy say's it's still not approved. Can we check on it and call her back she is completely out.

## 2018-04-07 DIAGNOSIS — E559 Vitamin D deficiency, unspecified: Secondary | ICD-10-CM

## 2018-04-07 DIAGNOSIS — A0472 Enterocolitis due to Clostridium difficile, not specified as recurrent: Secondary | ICD-10-CM

## 2018-04-07 DIAGNOSIS — G894 Chronic pain syndrome: Secondary | ICD-10-CM

## 2018-04-07 DIAGNOSIS — L97421 Non-pressure chronic ulcer of left heel and midfoot limited to breakdown of skin: Secondary | ICD-10-CM

## 2018-04-07 DIAGNOSIS — N3946 Mixed incontinence: Secondary | ICD-10-CM

## 2018-04-07 DIAGNOSIS — E039 Hypothyroidism, unspecified: Secondary | ICD-10-CM

## 2018-04-07 DIAGNOSIS — E1165 Type 2 diabetes mellitus with hyperglycemia: Secondary | ICD-10-CM

## 2018-04-07 DIAGNOSIS — Z794 Long term (current) use of insulin: Secondary | ICD-10-CM

## 2018-04-07 DIAGNOSIS — L97511 Non-pressure chronic ulcer of other part of right foot limited to breakdown of skin: Secondary | ICD-10-CM

## 2018-04-07 DIAGNOSIS — F419 Anxiety disorder, unspecified: Secondary | ICD-10-CM

## 2018-04-07 DIAGNOSIS — M519 Unspecified thoracic, thoracolumbar and lumbosacral intervertebral disc disorder: Secondary | ICD-10-CM

## 2018-04-07 DIAGNOSIS — M069 Rheumatoid arthritis, unspecified: Secondary | ICD-10-CM

## 2018-04-07 DIAGNOSIS — I110 Hypertensive heart disease with heart failure: Secondary | ICD-10-CM

## 2018-04-07 DIAGNOSIS — F5105 Insomnia due to other mental disorder: Secondary | ICD-10-CM

## 2018-04-07 DIAGNOSIS — G4733 Obstructive sleep apnea (adult) (pediatric): Secondary | ICD-10-CM

## 2018-04-07 DIAGNOSIS — R350 Frequency of micturition: Secondary | ICD-10-CM

## 2018-04-07 DIAGNOSIS — E1142 Type 2 diabetes mellitus with diabetic polyneuropathy: Secondary | ICD-10-CM

## 2018-04-07 DIAGNOSIS — Z87891 Personal history of nicotine dependence: Secondary | ICD-10-CM

## 2018-04-07 DIAGNOSIS — Z6841 Body Mass Index (BMI) 40.0 and over, adult: Secondary | ICD-10-CM

## 2018-04-07 DIAGNOSIS — E11621 Type 2 diabetes mellitus with foot ulcer: Principal | ICD-10-CM

## 2018-04-07 DIAGNOSIS — L97411 Non-pressure chronic ulcer of right heel and midfoot limited to breakdown of skin: Secondary | ICD-10-CM

## 2018-04-07 DIAGNOSIS — J449 Chronic obstructive pulmonary disease, unspecified: Secondary | ICD-10-CM

## 2018-04-07 DIAGNOSIS — I5032 Chronic diastolic (congestive) heart failure: Secondary | ICD-10-CM

## 2018-04-07 DIAGNOSIS — F319 Bipolar disorder, unspecified: Secondary | ICD-10-CM

## 2018-04-07 NOTE — Telephone Encounter (Signed)
Informed patient that PA was sent on 12-13, but that I was resending it again today.

## 2018-04-10 ENCOUNTER — Encounter: Admit: 2018-04-10 | Discharge: 2018-06-08 | Payer: MEDICAID

## 2018-04-10 ENCOUNTER — Ambulatory Visit: Admit: 2018-04-10 | Discharge: 2018-04-11 | Payer: MEDICAID | Attending: Rheumatology | Primary: Rheumatology

## 2018-04-10 DIAGNOSIS — M0579 Rheumatoid arthritis with rheumatoid factor of multiple sites without organ or systems involvement: Principal | ICD-10-CM

## 2018-04-11 ENCOUNTER — Encounter: Admit: 2018-04-11 | Discharge: 2018-04-11 | Payer: MEDICAID | Attending: Family | Primary: Family

## 2018-04-11 DIAGNOSIS — M0579 Rheumatoid arthritis with rheumatoid factor of multiple sites without organ or systems involvement: Secondary | ICD-10-CM

## 2018-04-11 DIAGNOSIS — B9562 Methicillin resistant Staphylococcus aureus infection as the cause of diseases classified elsewhere: Secondary | ICD-10-CM

## 2018-04-11 DIAGNOSIS — B9629 Other Escherichia coli [E. coli] as the cause of diseases classified elsewhere: Secondary | ICD-10-CM

## 2018-04-11 DIAGNOSIS — E039 Hypothyroidism, unspecified: Secondary | ICD-10-CM

## 2018-04-11 DIAGNOSIS — Z87891 Personal history of nicotine dependence: Secondary | ICD-10-CM

## 2018-04-11 DIAGNOSIS — F5105 Insomnia due to other mental disorder: Secondary | ICD-10-CM

## 2018-04-11 DIAGNOSIS — M519 Unspecified thoracic, thoracolumbar and lumbosacral intervertebral disc disorder: Secondary | ICD-10-CM

## 2018-04-11 DIAGNOSIS — L02611 Cutaneous abscess of right foot: Secondary | ICD-10-CM

## 2018-04-11 DIAGNOSIS — E876 Hypokalemia: Secondary | ICD-10-CM

## 2018-04-11 DIAGNOSIS — F319 Bipolar disorder, unspecified: Secondary | ICD-10-CM

## 2018-04-11 DIAGNOSIS — E1142 Type 2 diabetes mellitus with diabetic polyneuropathy: Secondary | ICD-10-CM

## 2018-04-11 DIAGNOSIS — Z6841 Body Mass Index (BMI) 40.0 and over, adult: Secondary | ICD-10-CM

## 2018-04-11 DIAGNOSIS — E1169 Type 2 diabetes mellitus with other specified complication: Secondary | ICD-10-CM

## 2018-04-11 DIAGNOSIS — G4733 Obstructive sleep apnea (adult) (pediatric): Secondary | ICD-10-CM

## 2018-04-11 DIAGNOSIS — Z792 Long term (current) use of antibiotics: Secondary | ICD-10-CM

## 2018-04-11 DIAGNOSIS — F419 Anxiety disorder, unspecified: Secondary | ICD-10-CM

## 2018-04-11 DIAGNOSIS — F603 Borderline personality disorder: Secondary | ICD-10-CM

## 2018-04-11 DIAGNOSIS — I110 Hypertensive heart disease with heart failure: Secondary | ICD-10-CM

## 2018-04-11 DIAGNOSIS — T8743 Infection of amputation stump, right lower extremity: Principal | ICD-10-CM

## 2018-04-11 DIAGNOSIS — I5032 Chronic diastolic (congestive) heart failure: Secondary | ICD-10-CM

## 2018-04-11 DIAGNOSIS — G894 Chronic pain syndrome: Secondary | ICD-10-CM

## 2018-04-11 DIAGNOSIS — J449 Chronic obstructive pulmonary disease, unspecified: Secondary | ICD-10-CM

## 2018-04-11 DIAGNOSIS — Z5181 Encounter for therapeutic drug level monitoring: Secondary | ICD-10-CM

## 2018-04-11 DIAGNOSIS — M869 Osteomyelitis, unspecified: Secondary | ICD-10-CM

## 2018-04-13 DIAGNOSIS — Z6841 Body Mass Index (BMI) 40.0 and over, adult: Secondary | ICD-10-CM

## 2018-04-13 DIAGNOSIS — T8743 Infection of amputation stump, right lower extremity: Principal | ICD-10-CM

## 2018-04-13 DIAGNOSIS — M0579 Rheumatoid arthritis with rheumatoid factor of multiple sites without organ or systems involvement: Secondary | ICD-10-CM

## 2018-04-13 DIAGNOSIS — Z5181 Encounter for therapeutic drug level monitoring: Secondary | ICD-10-CM

## 2018-04-13 DIAGNOSIS — F5105 Insomnia due to other mental disorder: Secondary | ICD-10-CM

## 2018-04-13 DIAGNOSIS — I5032 Chronic diastolic (congestive) heart failure: Secondary | ICD-10-CM

## 2018-04-13 DIAGNOSIS — E039 Hypothyroidism, unspecified: Secondary | ICD-10-CM

## 2018-04-13 DIAGNOSIS — Z87891 Personal history of nicotine dependence: Secondary | ICD-10-CM

## 2018-04-13 DIAGNOSIS — L02611 Cutaneous abscess of right foot: Secondary | ICD-10-CM

## 2018-04-13 DIAGNOSIS — G4733 Obstructive sleep apnea (adult) (pediatric): Secondary | ICD-10-CM

## 2018-04-13 DIAGNOSIS — M519 Unspecified thoracic, thoracolumbar and lumbosacral intervertebral disc disorder: Secondary | ICD-10-CM

## 2018-04-13 DIAGNOSIS — F319 Bipolar disorder, unspecified: Secondary | ICD-10-CM

## 2018-04-13 DIAGNOSIS — B9562 Methicillin resistant Staphylococcus aureus infection as the cause of diseases classified elsewhere: Secondary | ICD-10-CM

## 2018-04-13 DIAGNOSIS — I110 Hypertensive heart disease with heart failure: Secondary | ICD-10-CM

## 2018-04-13 DIAGNOSIS — E1169 Type 2 diabetes mellitus with other specified complication: Secondary | ICD-10-CM

## 2018-04-13 DIAGNOSIS — J449 Chronic obstructive pulmonary disease, unspecified: Secondary | ICD-10-CM

## 2018-04-13 DIAGNOSIS — E876 Hypokalemia: Secondary | ICD-10-CM

## 2018-04-13 DIAGNOSIS — B9629 Other Escherichia coli [E. coli] as the cause of diseases classified elsewhere: Secondary | ICD-10-CM

## 2018-04-13 DIAGNOSIS — Z792 Long term (current) use of antibiotics: Secondary | ICD-10-CM

## 2018-04-13 DIAGNOSIS — F603 Borderline personality disorder: Secondary | ICD-10-CM

## 2018-04-13 DIAGNOSIS — E1142 Type 2 diabetes mellitus with diabetic polyneuropathy: Secondary | ICD-10-CM

## 2018-04-13 DIAGNOSIS — M869 Osteomyelitis, unspecified: Secondary | ICD-10-CM

## 2018-04-13 DIAGNOSIS — F419 Anxiety disorder, unspecified: Secondary | ICD-10-CM

## 2018-04-13 DIAGNOSIS — G894 Chronic pain syndrome: Secondary | ICD-10-CM

## 2018-04-13 MED ORDER — PREDNISONE 5 MG TABLET
ORAL_TABLET | Freq: Every day | ORAL | 3 refills | 0.00000 days | Status: CP
Start: 2018-04-13 — End: 2018-06-13
  Filled 2018-06-18: qty 42, 33d supply, fill #0

## 2018-04-13 MED ORDER — METHOTREXATE SODIUM (PF) 25 MG/ML INJECTION SOLUTION
SUBCUTANEOUS | 0 refills | 0.00000 days | Status: CP
Start: 2018-04-13 — End: 2018-06-13

## 2018-04-15 DIAGNOSIS — G4733 Obstructive sleep apnea (adult) (pediatric): Secondary | ICD-10-CM

## 2018-04-15 DIAGNOSIS — Z5181 Encounter for therapeutic drug level monitoring: Secondary | ICD-10-CM

## 2018-04-15 DIAGNOSIS — F319 Bipolar disorder, unspecified: Secondary | ICD-10-CM

## 2018-04-15 DIAGNOSIS — Z792 Long term (current) use of antibiotics: Secondary | ICD-10-CM

## 2018-04-15 DIAGNOSIS — I110 Hypertensive heart disease with heart failure: Secondary | ICD-10-CM

## 2018-04-15 DIAGNOSIS — B9562 Methicillin resistant Staphylococcus aureus infection as the cause of diseases classified elsewhere: Secondary | ICD-10-CM

## 2018-04-15 DIAGNOSIS — E876 Hypokalemia: Secondary | ICD-10-CM

## 2018-04-15 DIAGNOSIS — Z87891 Personal history of nicotine dependence: Secondary | ICD-10-CM

## 2018-04-15 DIAGNOSIS — M869 Osteomyelitis, unspecified: Secondary | ICD-10-CM

## 2018-04-15 DIAGNOSIS — F419 Anxiety disorder, unspecified: Secondary | ICD-10-CM

## 2018-04-15 DIAGNOSIS — G894 Chronic pain syndrome: Secondary | ICD-10-CM

## 2018-04-15 DIAGNOSIS — M519 Unspecified thoracic, thoracolumbar and lumbosacral intervertebral disc disorder: Secondary | ICD-10-CM

## 2018-04-15 DIAGNOSIS — E1142 Type 2 diabetes mellitus with diabetic polyneuropathy: Secondary | ICD-10-CM

## 2018-04-15 DIAGNOSIS — T8743 Infection of amputation stump, right lower extremity: Principal | ICD-10-CM

## 2018-04-15 DIAGNOSIS — Z6841 Body Mass Index (BMI) 40.0 and over, adult: Secondary | ICD-10-CM

## 2018-04-15 DIAGNOSIS — E1169 Type 2 diabetes mellitus with other specified complication: Secondary | ICD-10-CM

## 2018-04-15 DIAGNOSIS — B9629 Other Escherichia coli [E. coli] as the cause of diseases classified elsewhere: Secondary | ICD-10-CM

## 2018-04-15 DIAGNOSIS — L02611 Cutaneous abscess of right foot: Secondary | ICD-10-CM

## 2018-04-15 DIAGNOSIS — E039 Hypothyroidism, unspecified: Secondary | ICD-10-CM

## 2018-04-15 DIAGNOSIS — F603 Borderline personality disorder: Secondary | ICD-10-CM

## 2018-04-15 DIAGNOSIS — F5105 Insomnia due to other mental disorder: Secondary | ICD-10-CM

## 2018-04-15 DIAGNOSIS — M0579 Rheumatoid arthritis with rheumatoid factor of multiple sites without organ or systems involvement: Secondary | ICD-10-CM

## 2018-04-15 DIAGNOSIS — J449 Chronic obstructive pulmonary disease, unspecified: Secondary | ICD-10-CM

## 2018-04-15 DIAGNOSIS — I5032 Chronic diastolic (congestive) heart failure: Secondary | ICD-10-CM

## 2018-04-17 ENCOUNTER — Other Ambulatory Visit: Payer: Self-pay | Admitting: Psychiatry

## 2018-04-17 ENCOUNTER — Encounter: Admit: 2018-04-17 | Discharge: 2018-04-17 | Payer: MEDICAID | Attending: Family | Primary: Family

## 2018-04-17 DIAGNOSIS — L02611 Cutaneous abscess of right foot: Secondary | ICD-10-CM

## 2018-04-17 DIAGNOSIS — F5105 Insomnia due to other mental disorder: Secondary | ICD-10-CM

## 2018-04-17 DIAGNOSIS — T8743 Infection of amputation stump, right lower extremity: Principal | ICD-10-CM

## 2018-04-17 DIAGNOSIS — Z87891 Personal history of nicotine dependence: Secondary | ICD-10-CM

## 2018-04-17 DIAGNOSIS — E1169 Type 2 diabetes mellitus with other specified complication: Secondary | ICD-10-CM

## 2018-04-17 DIAGNOSIS — I5032 Chronic diastolic (congestive) heart failure: Secondary | ICD-10-CM

## 2018-04-17 DIAGNOSIS — M0579 Rheumatoid arthritis with rheumatoid factor of multiple sites without organ or systems involvement: Secondary | ICD-10-CM

## 2018-04-17 DIAGNOSIS — G894 Chronic pain syndrome: Secondary | ICD-10-CM

## 2018-04-17 DIAGNOSIS — M869 Osteomyelitis, unspecified: Secondary | ICD-10-CM

## 2018-04-17 DIAGNOSIS — F603 Borderline personality disorder: Secondary | ICD-10-CM

## 2018-04-17 DIAGNOSIS — F419 Anxiety disorder, unspecified: Secondary | ICD-10-CM

## 2018-04-17 DIAGNOSIS — I110 Hypertensive heart disease with heart failure: Secondary | ICD-10-CM

## 2018-04-17 DIAGNOSIS — M519 Unspecified thoracic, thoracolumbar and lumbosacral intervertebral disc disorder: Secondary | ICD-10-CM

## 2018-04-17 DIAGNOSIS — B9629 Other Escherichia coli [E. coli] as the cause of diseases classified elsewhere: Secondary | ICD-10-CM

## 2018-04-17 DIAGNOSIS — F319 Bipolar disorder, unspecified: Secondary | ICD-10-CM

## 2018-04-17 DIAGNOSIS — Z792 Long term (current) use of antibiotics: Secondary | ICD-10-CM

## 2018-04-17 DIAGNOSIS — E1142 Type 2 diabetes mellitus with diabetic polyneuropathy: Secondary | ICD-10-CM

## 2018-04-17 DIAGNOSIS — Z6841 Body Mass Index (BMI) 40.0 and over, adult: Secondary | ICD-10-CM

## 2018-04-17 DIAGNOSIS — E039 Hypothyroidism, unspecified: Secondary | ICD-10-CM

## 2018-04-17 DIAGNOSIS — G4733 Obstructive sleep apnea (adult) (pediatric): Secondary | ICD-10-CM

## 2018-04-17 DIAGNOSIS — B9562 Methicillin resistant Staphylococcus aureus infection as the cause of diseases classified elsewhere: Secondary | ICD-10-CM

## 2018-04-17 DIAGNOSIS — J449 Chronic obstructive pulmonary disease, unspecified: Secondary | ICD-10-CM

## 2018-04-17 DIAGNOSIS — Z5181 Encounter for therapeutic drug level monitoring: Secondary | ICD-10-CM

## 2018-04-17 DIAGNOSIS — E876 Hypokalemia: Secondary | ICD-10-CM

## 2018-04-19 DIAGNOSIS — F5105 Insomnia due to other mental disorder: Secondary | ICD-10-CM

## 2018-04-19 DIAGNOSIS — F419 Anxiety disorder, unspecified: Secondary | ICD-10-CM

## 2018-04-19 DIAGNOSIS — G894 Chronic pain syndrome: Secondary | ICD-10-CM

## 2018-04-19 DIAGNOSIS — F319 Bipolar disorder, unspecified: Secondary | ICD-10-CM

## 2018-04-19 DIAGNOSIS — E039 Hypothyroidism, unspecified: Secondary | ICD-10-CM

## 2018-04-19 DIAGNOSIS — I110 Hypertensive heart disease with heart failure: Secondary | ICD-10-CM

## 2018-04-19 DIAGNOSIS — L02611 Cutaneous abscess of right foot: Secondary | ICD-10-CM

## 2018-04-19 DIAGNOSIS — M0579 Rheumatoid arthritis with rheumatoid factor of multiple sites without organ or systems involvement: Secondary | ICD-10-CM

## 2018-04-19 DIAGNOSIS — Z792 Long term (current) use of antibiotics: Secondary | ICD-10-CM

## 2018-04-19 DIAGNOSIS — B9562 Methicillin resistant Staphylococcus aureus infection as the cause of diseases classified elsewhere: Secondary | ICD-10-CM

## 2018-04-19 DIAGNOSIS — E876 Hypokalemia: Secondary | ICD-10-CM

## 2018-04-19 DIAGNOSIS — B9629 Other Escherichia coli [E. coli] as the cause of diseases classified elsewhere: Secondary | ICD-10-CM

## 2018-04-19 DIAGNOSIS — Z87891 Personal history of nicotine dependence: Secondary | ICD-10-CM

## 2018-04-19 DIAGNOSIS — F603 Borderline personality disorder: Secondary | ICD-10-CM

## 2018-04-19 DIAGNOSIS — I5032 Chronic diastolic (congestive) heart failure: Secondary | ICD-10-CM

## 2018-04-19 DIAGNOSIS — M519 Unspecified thoracic, thoracolumbar and lumbosacral intervertebral disc disorder: Secondary | ICD-10-CM

## 2018-04-19 DIAGNOSIS — J449 Chronic obstructive pulmonary disease, unspecified: Secondary | ICD-10-CM

## 2018-04-19 DIAGNOSIS — E1169 Type 2 diabetes mellitus with other specified complication: Secondary | ICD-10-CM

## 2018-04-19 DIAGNOSIS — G4733 Obstructive sleep apnea (adult) (pediatric): Secondary | ICD-10-CM

## 2018-04-19 DIAGNOSIS — M869 Osteomyelitis, unspecified: Secondary | ICD-10-CM

## 2018-04-19 DIAGNOSIS — Z6841 Body Mass Index (BMI) 40.0 and over, adult: Secondary | ICD-10-CM

## 2018-04-19 DIAGNOSIS — Z5181 Encounter for therapeutic drug level monitoring: Secondary | ICD-10-CM

## 2018-04-19 DIAGNOSIS — E1142 Type 2 diabetes mellitus with diabetic polyneuropathy: Secondary | ICD-10-CM

## 2018-04-19 DIAGNOSIS — T8743 Infection of amputation stump, right lower extremity: Principal | ICD-10-CM

## 2018-04-21 DIAGNOSIS — M519 Unspecified thoracic, thoracolumbar and lumbosacral intervertebral disc disorder: Secondary | ICD-10-CM

## 2018-04-21 DIAGNOSIS — T8743 Infection of amputation stump, right lower extremity: Principal | ICD-10-CM

## 2018-04-21 DIAGNOSIS — B9629 Other Escherichia coli [E. coli] as the cause of diseases classified elsewhere: Secondary | ICD-10-CM

## 2018-04-21 DIAGNOSIS — B9562 Methicillin resistant Staphylococcus aureus infection as the cause of diseases classified elsewhere: Secondary | ICD-10-CM

## 2018-04-21 DIAGNOSIS — M0579 Rheumatoid arthritis with rheumatoid factor of multiple sites without organ or systems involvement: Secondary | ICD-10-CM

## 2018-04-21 DIAGNOSIS — E1169 Type 2 diabetes mellitus with other specified complication: Secondary | ICD-10-CM

## 2018-04-21 DIAGNOSIS — Z5181 Encounter for therapeutic drug level monitoring: Secondary | ICD-10-CM

## 2018-04-21 DIAGNOSIS — J449 Chronic obstructive pulmonary disease, unspecified: Secondary | ICD-10-CM

## 2018-04-21 DIAGNOSIS — F603 Borderline personality disorder: Secondary | ICD-10-CM

## 2018-04-21 DIAGNOSIS — E039 Hypothyroidism, unspecified: Secondary | ICD-10-CM

## 2018-04-21 DIAGNOSIS — F419 Anxiety disorder, unspecified: Secondary | ICD-10-CM

## 2018-04-21 DIAGNOSIS — I5032 Chronic diastolic (congestive) heart failure: Secondary | ICD-10-CM

## 2018-04-21 DIAGNOSIS — F319 Bipolar disorder, unspecified: Secondary | ICD-10-CM

## 2018-04-21 DIAGNOSIS — E876 Hypokalemia: Secondary | ICD-10-CM

## 2018-04-21 DIAGNOSIS — M869 Osteomyelitis, unspecified: Secondary | ICD-10-CM

## 2018-04-21 DIAGNOSIS — Z87891 Personal history of nicotine dependence: Secondary | ICD-10-CM

## 2018-04-21 DIAGNOSIS — L02611 Cutaneous abscess of right foot: Secondary | ICD-10-CM

## 2018-04-21 DIAGNOSIS — G4733 Obstructive sleep apnea (adult) (pediatric): Secondary | ICD-10-CM

## 2018-04-21 DIAGNOSIS — G894 Chronic pain syndrome: Secondary | ICD-10-CM

## 2018-04-21 DIAGNOSIS — Z6841 Body Mass Index (BMI) 40.0 and over, adult: Secondary | ICD-10-CM

## 2018-04-21 DIAGNOSIS — Z792 Long term (current) use of antibiotics: Secondary | ICD-10-CM

## 2018-04-21 DIAGNOSIS — E1142 Type 2 diabetes mellitus with diabetic polyneuropathy: Secondary | ICD-10-CM

## 2018-04-21 DIAGNOSIS — F5105 Insomnia due to other mental disorder: Secondary | ICD-10-CM

## 2018-04-21 DIAGNOSIS — I110 Hypertensive heart disease with heart failure: Secondary | ICD-10-CM

## 2018-04-24 ENCOUNTER — Other Ambulatory Visit: Admit: 2018-04-24 | Discharge: 2018-04-25 | Payer: MEDICAID

## 2018-04-24 DIAGNOSIS — E1142 Type 2 diabetes mellitus with diabetic polyneuropathy: Secondary | ICD-10-CM

## 2018-04-24 DIAGNOSIS — G4733 Obstructive sleep apnea (adult) (pediatric): Secondary | ICD-10-CM

## 2018-04-24 DIAGNOSIS — I5032 Chronic diastolic (congestive) heart failure: Secondary | ICD-10-CM

## 2018-04-24 DIAGNOSIS — B9562 Methicillin resistant Staphylococcus aureus infection as the cause of diseases classified elsewhere: Secondary | ICD-10-CM

## 2018-04-24 DIAGNOSIS — Z87891 Personal history of nicotine dependence: Secondary | ICD-10-CM

## 2018-04-24 DIAGNOSIS — E1169 Type 2 diabetes mellitus with other specified complication: Secondary | ICD-10-CM

## 2018-04-24 DIAGNOSIS — I110 Hypertensive heart disease with heart failure: Secondary | ICD-10-CM

## 2018-04-24 DIAGNOSIS — Z792 Long term (current) use of antibiotics: Secondary | ICD-10-CM

## 2018-04-24 DIAGNOSIS — F5105 Insomnia due to other mental disorder: Secondary | ICD-10-CM

## 2018-04-24 DIAGNOSIS — T8743 Infection of amputation stump, right lower extremity: Principal | ICD-10-CM

## 2018-04-24 DIAGNOSIS — L02611 Cutaneous abscess of right foot: Secondary | ICD-10-CM

## 2018-04-24 DIAGNOSIS — B9629 Other Escherichia coli [E. coli] as the cause of diseases classified elsewhere: Secondary | ICD-10-CM

## 2018-04-24 DIAGNOSIS — F319 Bipolar disorder, unspecified: Secondary | ICD-10-CM

## 2018-04-24 DIAGNOSIS — F603 Borderline personality disorder: Secondary | ICD-10-CM

## 2018-04-24 DIAGNOSIS — F419 Anxiety disorder, unspecified: Secondary | ICD-10-CM

## 2018-04-24 DIAGNOSIS — M519 Unspecified thoracic, thoracolumbar and lumbosacral intervertebral disc disorder: Secondary | ICD-10-CM

## 2018-04-24 DIAGNOSIS — Z5181 Encounter for therapeutic drug level monitoring: Secondary | ICD-10-CM

## 2018-04-24 DIAGNOSIS — G894 Chronic pain syndrome: Secondary | ICD-10-CM

## 2018-04-24 DIAGNOSIS — J449 Chronic obstructive pulmonary disease, unspecified: Secondary | ICD-10-CM

## 2018-04-24 DIAGNOSIS — M0579 Rheumatoid arthritis with rheumatoid factor of multiple sites without organ or systems involvement: Secondary | ICD-10-CM

## 2018-04-24 DIAGNOSIS — E039 Hypothyroidism, unspecified: Secondary | ICD-10-CM

## 2018-04-24 DIAGNOSIS — Z6841 Body Mass Index (BMI) 40.0 and over, adult: Secondary | ICD-10-CM

## 2018-04-24 DIAGNOSIS — E876 Hypokalemia: Secondary | ICD-10-CM

## 2018-04-24 DIAGNOSIS — M869 Osteomyelitis, unspecified: Secondary | ICD-10-CM

## 2018-04-25 ENCOUNTER — Other Ambulatory Visit: Payer: Self-pay | Admitting: Psychiatry

## 2018-04-25 ENCOUNTER — Ambulatory Visit: Payer: Medicaid Other | Admitting: Psychiatry

## 2018-04-25 DIAGNOSIS — B9562 Methicillin resistant Staphylococcus aureus infection as the cause of diseases classified elsewhere: Secondary | ICD-10-CM

## 2018-04-25 DIAGNOSIS — E1142 Type 2 diabetes mellitus with diabetic polyneuropathy: Secondary | ICD-10-CM

## 2018-04-25 DIAGNOSIS — E1169 Type 2 diabetes mellitus with other specified complication: Secondary | ICD-10-CM

## 2018-04-25 DIAGNOSIS — I5032 Chronic diastolic (congestive) heart failure: Secondary | ICD-10-CM

## 2018-04-25 DIAGNOSIS — J449 Chronic obstructive pulmonary disease, unspecified: Secondary | ICD-10-CM

## 2018-04-25 DIAGNOSIS — Z792 Long term (current) use of antibiotics: Secondary | ICD-10-CM

## 2018-04-25 DIAGNOSIS — I110 Hypertensive heart disease with heart failure: Secondary | ICD-10-CM

## 2018-04-25 DIAGNOSIS — M0579 Rheumatoid arthritis with rheumatoid factor of multiple sites without organ or systems involvement: Secondary | ICD-10-CM

## 2018-04-25 DIAGNOSIS — F5105 Insomnia due to other mental disorder: Secondary | ICD-10-CM

## 2018-04-25 DIAGNOSIS — E039 Hypothyroidism, unspecified: Secondary | ICD-10-CM

## 2018-04-25 DIAGNOSIS — E876 Hypokalemia: Secondary | ICD-10-CM

## 2018-04-25 DIAGNOSIS — B9629 Other Escherichia coli [E. coli] as the cause of diseases classified elsewhere: Secondary | ICD-10-CM

## 2018-04-25 DIAGNOSIS — F603 Borderline personality disorder: Secondary | ICD-10-CM

## 2018-04-25 DIAGNOSIS — T8743 Infection of amputation stump, right lower extremity: Principal | ICD-10-CM

## 2018-04-25 DIAGNOSIS — M869 Osteomyelitis, unspecified: Secondary | ICD-10-CM

## 2018-04-25 DIAGNOSIS — Z5181 Encounter for therapeutic drug level monitoring: Secondary | ICD-10-CM

## 2018-04-25 DIAGNOSIS — L02611 Cutaneous abscess of right foot: Secondary | ICD-10-CM

## 2018-04-25 DIAGNOSIS — F319 Bipolar disorder, unspecified: Secondary | ICD-10-CM

## 2018-04-25 DIAGNOSIS — Z6841 Body Mass Index (BMI) 40.0 and over, adult: Secondary | ICD-10-CM

## 2018-04-25 DIAGNOSIS — G894 Chronic pain syndrome: Secondary | ICD-10-CM

## 2018-04-25 DIAGNOSIS — Z87891 Personal history of nicotine dependence: Secondary | ICD-10-CM

## 2018-04-25 DIAGNOSIS — M519 Unspecified thoracic, thoracolumbar and lumbosacral intervertebral disc disorder: Secondary | ICD-10-CM

## 2018-04-25 DIAGNOSIS — G4733 Obstructive sleep apnea (adult) (pediatric): Secondary | ICD-10-CM

## 2018-04-25 DIAGNOSIS — F419 Anxiety disorder, unspecified: Secondary | ICD-10-CM

## 2018-04-25 MED ORDER — QUETIAPINE FUMARATE 300 MG PO TABS: ORAL_TABLET | ORAL | 0 refills | Status: DC

## 2018-04-25 MED ORDER — BUSPIRONE HCL 10 MG PO TABS: 10.0000 mg | ORAL_TABLET | Freq: Two times a day (BID) | ORAL | 0 refills | Status: DC

## 2018-04-25 MED ORDER — FLUOXETINE HCL 20 MG PO CAPS: 20.0000 mg | ORAL_CAPSULE | Freq: Every day | ORAL | 0 refills | Status: DC

## 2018-04-26 ENCOUNTER — Ambulatory Visit
Admit: 2018-04-26 | Discharge: 2018-04-27 | Payer: MEDICAID | Attending: Foot & Ankle Surgery | Primary: Foot & Ankle Surgery

## 2018-04-26 DIAGNOSIS — Z5181 Encounter for therapeutic drug level monitoring: Secondary | ICD-10-CM

## 2018-04-26 DIAGNOSIS — E876 Hypokalemia: Secondary | ICD-10-CM

## 2018-04-26 DIAGNOSIS — L02611 Cutaneous abscess of right foot: Secondary | ICD-10-CM

## 2018-04-26 DIAGNOSIS — I5032 Chronic diastolic (congestive) heart failure: Secondary | ICD-10-CM

## 2018-04-26 DIAGNOSIS — Z89419 Acquired absence of unspecified great toe: Secondary | ICD-10-CM

## 2018-04-26 DIAGNOSIS — Z87891 Personal history of nicotine dependence: Secondary | ICD-10-CM

## 2018-04-26 DIAGNOSIS — E039 Hypothyroidism, unspecified: Secondary | ICD-10-CM

## 2018-04-26 DIAGNOSIS — F419 Anxiety disorder, unspecified: Secondary | ICD-10-CM

## 2018-04-26 DIAGNOSIS — Z6841 Body Mass Index (BMI) 40.0 and over, adult: Secondary | ICD-10-CM

## 2018-04-26 DIAGNOSIS — F603 Borderline personality disorder: Secondary | ICD-10-CM

## 2018-04-26 DIAGNOSIS — B9629 Other Escherichia coli [E. coli] as the cause of diseases classified elsewhere: Secondary | ICD-10-CM

## 2018-04-26 DIAGNOSIS — L97412 Non-pressure chronic ulcer of right heel and midfoot with fat layer exposed: Secondary | ICD-10-CM

## 2018-04-26 DIAGNOSIS — E11621 Type 2 diabetes mellitus with foot ulcer: Principal | ICD-10-CM

## 2018-04-26 DIAGNOSIS — F5105 Insomnia due to other mental disorder: Secondary | ICD-10-CM

## 2018-04-26 DIAGNOSIS — T8743 Infection of amputation stump, right lower extremity: Principal | ICD-10-CM

## 2018-04-26 DIAGNOSIS — F319 Bipolar disorder, unspecified: Secondary | ICD-10-CM

## 2018-04-26 DIAGNOSIS — E1169 Type 2 diabetes mellitus with other specified complication: Secondary | ICD-10-CM

## 2018-04-26 DIAGNOSIS — Z792 Long term (current) use of antibiotics: Secondary | ICD-10-CM

## 2018-04-26 DIAGNOSIS — J449 Chronic obstructive pulmonary disease, unspecified: Secondary | ICD-10-CM

## 2018-04-26 DIAGNOSIS — M519 Unspecified thoracic, thoracolumbar and lumbosacral intervertebral disc disorder: Secondary | ICD-10-CM

## 2018-04-26 DIAGNOSIS — B9562 Methicillin resistant Staphylococcus aureus infection as the cause of diseases classified elsewhere: Secondary | ICD-10-CM

## 2018-04-26 DIAGNOSIS — G4733 Obstructive sleep apnea (adult) (pediatric): Secondary | ICD-10-CM

## 2018-04-26 DIAGNOSIS — G894 Chronic pain syndrome: Secondary | ICD-10-CM

## 2018-04-26 DIAGNOSIS — M0579 Rheumatoid arthritis with rheumatoid factor of multiple sites without organ or systems involvement: Secondary | ICD-10-CM

## 2018-04-26 DIAGNOSIS — M869 Osteomyelitis, unspecified: Secondary | ICD-10-CM

## 2018-04-26 DIAGNOSIS — E1142 Type 2 diabetes mellitus with diabetic polyneuropathy: Secondary | ICD-10-CM

## 2018-04-26 DIAGNOSIS — I110 Hypertensive heart disease with heart failure: Secondary | ICD-10-CM

## 2018-04-27 ENCOUNTER — Encounter: Admit: 2018-04-27 | Discharge: 2018-04-27 | Payer: MEDICAID

## 2018-04-27 ENCOUNTER — Ambulatory Visit: Admit: 2018-04-27 | Discharge: 2018-04-28 | Payer: MEDICAID | Attending: Urology | Primary: Urology

## 2018-04-27 DIAGNOSIS — G4733 Obstructive sleep apnea (adult) (pediatric): Secondary | ICD-10-CM

## 2018-04-27 DIAGNOSIS — Z6841 Body Mass Index (BMI) 40.0 and over, adult: Secondary | ICD-10-CM

## 2018-04-27 DIAGNOSIS — M519 Unspecified thoracic, thoracolumbar and lumbosacral intervertebral disc disorder: Secondary | ICD-10-CM

## 2018-04-27 DIAGNOSIS — I110 Hypertensive heart disease with heart failure: Secondary | ICD-10-CM

## 2018-04-27 DIAGNOSIS — B9629 Other Escherichia coli [E. coli] as the cause of diseases classified elsewhere: Secondary | ICD-10-CM

## 2018-04-27 DIAGNOSIS — F603 Borderline personality disorder: Secondary | ICD-10-CM

## 2018-04-27 DIAGNOSIS — M869 Osteomyelitis, unspecified: Secondary | ICD-10-CM

## 2018-04-27 DIAGNOSIS — T829XXA Unspecified complication of cardiac and vascular prosthetic device, implant and graft, initial encounter: Secondary | ICD-10-CM

## 2018-04-27 DIAGNOSIS — E876 Hypokalemia: Secondary | ICD-10-CM

## 2018-04-27 DIAGNOSIS — Z87891 Personal history of nicotine dependence: Secondary | ICD-10-CM

## 2018-04-27 DIAGNOSIS — Z5181 Encounter for therapeutic drug level monitoring: Secondary | ICD-10-CM

## 2018-04-27 DIAGNOSIS — F5105 Insomnia due to other mental disorder: Secondary | ICD-10-CM

## 2018-04-27 DIAGNOSIS — F419 Anxiety disorder, unspecified: Secondary | ICD-10-CM

## 2018-04-27 DIAGNOSIS — R350 Frequency of micturition: Principal | ICD-10-CM

## 2018-04-27 DIAGNOSIS — T8743 Infection of amputation stump, right lower extremity: Principal | ICD-10-CM

## 2018-04-27 DIAGNOSIS — J449 Chronic obstructive pulmonary disease, unspecified: Secondary | ICD-10-CM

## 2018-04-27 DIAGNOSIS — E1142 Type 2 diabetes mellitus with diabetic polyneuropathy: Secondary | ICD-10-CM

## 2018-04-27 DIAGNOSIS — G894 Chronic pain syndrome: Secondary | ICD-10-CM

## 2018-04-27 DIAGNOSIS — E039 Hypothyroidism, unspecified: Secondary | ICD-10-CM

## 2018-04-27 DIAGNOSIS — N3941 Urge incontinence: Secondary | ICD-10-CM

## 2018-04-27 DIAGNOSIS — Z792 Long term (current) use of antibiotics: Secondary | ICD-10-CM

## 2018-04-27 DIAGNOSIS — L02611 Cutaneous abscess of right foot: Secondary | ICD-10-CM

## 2018-04-27 DIAGNOSIS — F319 Bipolar disorder, unspecified: Secondary | ICD-10-CM

## 2018-04-27 DIAGNOSIS — B9562 Methicillin resistant Staphylococcus aureus infection as the cause of diseases classified elsewhere: Secondary | ICD-10-CM

## 2018-04-27 DIAGNOSIS — I5032 Chronic diastolic (congestive) heart failure: Secondary | ICD-10-CM

## 2018-04-27 DIAGNOSIS — E1169 Type 2 diabetes mellitus with other specified complication: Secondary | ICD-10-CM

## 2018-04-27 DIAGNOSIS — M0579 Rheumatoid arthritis with rheumatoid factor of multiple sites without organ or systems involvement: Secondary | ICD-10-CM

## 2018-04-27 MED ORDER — SOLIFENACIN 5 MG TABLET
ORAL_TABLET | Freq: Every day | ORAL | 12 refills | 0 days | Status: CP
Start: 2018-04-27 — End: 2018-06-13

## 2018-04-28 DIAGNOSIS — Z5181 Encounter for therapeutic drug level monitoring: Secondary | ICD-10-CM

## 2018-04-28 DIAGNOSIS — F5105 Insomnia due to other mental disorder: Secondary | ICD-10-CM

## 2018-04-28 DIAGNOSIS — M519 Unspecified thoracic, thoracolumbar and lumbosacral intervertebral disc disorder: Secondary | ICD-10-CM

## 2018-04-28 DIAGNOSIS — I110 Hypertensive heart disease with heart failure: Secondary | ICD-10-CM

## 2018-04-28 DIAGNOSIS — E876 Hypokalemia: Secondary | ICD-10-CM

## 2018-04-28 DIAGNOSIS — M869 Osteomyelitis, unspecified: Secondary | ICD-10-CM

## 2018-04-28 DIAGNOSIS — L02611 Cutaneous abscess of right foot: Secondary | ICD-10-CM

## 2018-04-28 DIAGNOSIS — F319 Bipolar disorder, unspecified: Secondary | ICD-10-CM

## 2018-04-28 DIAGNOSIS — E1169 Type 2 diabetes mellitus with other specified complication: Secondary | ICD-10-CM

## 2018-04-28 DIAGNOSIS — M0579 Rheumatoid arthritis with rheumatoid factor of multiple sites without organ or systems involvement: Secondary | ICD-10-CM

## 2018-04-28 DIAGNOSIS — F603 Borderline personality disorder: Secondary | ICD-10-CM

## 2018-04-28 DIAGNOSIS — Z87891 Personal history of nicotine dependence: Secondary | ICD-10-CM

## 2018-04-28 DIAGNOSIS — I5032 Chronic diastolic (congestive) heart failure: Secondary | ICD-10-CM

## 2018-04-28 DIAGNOSIS — G4733 Obstructive sleep apnea (adult) (pediatric): Secondary | ICD-10-CM

## 2018-04-28 DIAGNOSIS — B9629 Other Escherichia coli [E. coli] as the cause of diseases classified elsewhere: Secondary | ICD-10-CM

## 2018-04-28 DIAGNOSIS — E039 Hypothyroidism, unspecified: Secondary | ICD-10-CM

## 2018-04-28 DIAGNOSIS — B9562 Methicillin resistant Staphylococcus aureus infection as the cause of diseases classified elsewhere: Secondary | ICD-10-CM

## 2018-04-28 DIAGNOSIS — G894 Chronic pain syndrome: Secondary | ICD-10-CM

## 2018-04-28 DIAGNOSIS — F419 Anxiety disorder, unspecified: Secondary | ICD-10-CM

## 2018-04-28 DIAGNOSIS — J449 Chronic obstructive pulmonary disease, unspecified: Secondary | ICD-10-CM

## 2018-04-28 DIAGNOSIS — Z6841 Body Mass Index (BMI) 40.0 and over, adult: Secondary | ICD-10-CM

## 2018-04-28 DIAGNOSIS — E1142 Type 2 diabetes mellitus with diabetic polyneuropathy: Secondary | ICD-10-CM

## 2018-04-28 DIAGNOSIS — Z792 Long term (current) use of antibiotics: Secondary | ICD-10-CM

## 2018-04-28 DIAGNOSIS — T8743 Infection of amputation stump, right lower extremity: Principal | ICD-10-CM

## 2018-05-01 ENCOUNTER — Encounter: Admit: 2018-05-01 | Discharge: 2018-05-01 | Payer: MEDICAID | Attending: Family | Primary: Family

## 2018-05-01 DIAGNOSIS — G894 Chronic pain syndrome: Secondary | ICD-10-CM

## 2018-05-01 DIAGNOSIS — E1142 Type 2 diabetes mellitus with diabetic polyneuropathy: Secondary | ICD-10-CM

## 2018-05-01 DIAGNOSIS — M869 Osteomyelitis, unspecified: Secondary | ICD-10-CM

## 2018-05-01 DIAGNOSIS — F319 Bipolar disorder, unspecified: Secondary | ICD-10-CM

## 2018-05-01 DIAGNOSIS — E039 Hypothyroidism, unspecified: Secondary | ICD-10-CM

## 2018-05-01 DIAGNOSIS — E876 Hypokalemia: Secondary | ICD-10-CM

## 2018-05-01 DIAGNOSIS — J449 Chronic obstructive pulmonary disease, unspecified: Secondary | ICD-10-CM

## 2018-05-01 DIAGNOSIS — G4733 Obstructive sleep apnea (adult) (pediatric): Secondary | ICD-10-CM

## 2018-05-01 DIAGNOSIS — F603 Borderline personality disorder: Secondary | ICD-10-CM

## 2018-05-01 DIAGNOSIS — F419 Anxiety disorder, unspecified: Secondary | ICD-10-CM

## 2018-05-01 DIAGNOSIS — E1169 Type 2 diabetes mellitus with other specified complication: Secondary | ICD-10-CM

## 2018-05-01 DIAGNOSIS — M0579 Rheumatoid arthritis with rheumatoid factor of multiple sites without organ or systems involvement: Secondary | ICD-10-CM

## 2018-05-01 DIAGNOSIS — M519 Unspecified thoracic, thoracolumbar and lumbosacral intervertebral disc disorder: Secondary | ICD-10-CM

## 2018-05-01 DIAGNOSIS — B9629 Other Escherichia coli [E. coli] as the cause of diseases classified elsewhere: Secondary | ICD-10-CM

## 2018-05-01 DIAGNOSIS — F5105 Insomnia due to other mental disorder: Secondary | ICD-10-CM

## 2018-05-01 DIAGNOSIS — Z6841 Body Mass Index (BMI) 40.0 and over, adult: Secondary | ICD-10-CM

## 2018-05-01 DIAGNOSIS — Z5181 Encounter for therapeutic drug level monitoring: Secondary | ICD-10-CM

## 2018-05-01 DIAGNOSIS — T8743 Infection of amputation stump, right lower extremity: Principal | ICD-10-CM

## 2018-05-01 DIAGNOSIS — L02611 Cutaneous abscess of right foot: Secondary | ICD-10-CM

## 2018-05-01 DIAGNOSIS — Z792 Long term (current) use of antibiotics: Secondary | ICD-10-CM

## 2018-05-01 DIAGNOSIS — I110 Hypertensive heart disease with heart failure: Secondary | ICD-10-CM

## 2018-05-01 DIAGNOSIS — Z87891 Personal history of nicotine dependence: Secondary | ICD-10-CM

## 2018-05-01 DIAGNOSIS — B9562 Methicillin resistant Staphylococcus aureus infection as the cause of diseases classified elsewhere: Secondary | ICD-10-CM

## 2018-05-01 DIAGNOSIS — I5032 Chronic diastolic (congestive) heart failure: Secondary | ICD-10-CM

## 2018-05-03 DIAGNOSIS — L02611 Cutaneous abscess of right foot: Secondary | ICD-10-CM

## 2018-05-03 DIAGNOSIS — E1142 Type 2 diabetes mellitus with diabetic polyneuropathy: Secondary | ICD-10-CM

## 2018-05-03 DIAGNOSIS — F419 Anxiety disorder, unspecified: Secondary | ICD-10-CM

## 2018-05-03 DIAGNOSIS — M0579 Rheumatoid arthritis with rheumatoid factor of multiple sites without organ or systems involvement: Secondary | ICD-10-CM

## 2018-05-03 DIAGNOSIS — G4733 Obstructive sleep apnea (adult) (pediatric): Secondary | ICD-10-CM

## 2018-05-03 DIAGNOSIS — I5032 Chronic diastolic (congestive) heart failure: Secondary | ICD-10-CM

## 2018-05-03 DIAGNOSIS — M519 Unspecified thoracic, thoracolumbar and lumbosacral intervertebral disc disorder: Secondary | ICD-10-CM

## 2018-05-03 DIAGNOSIS — E876 Hypokalemia: Secondary | ICD-10-CM

## 2018-05-03 DIAGNOSIS — T8743 Infection of amputation stump, right lower extremity: Principal | ICD-10-CM

## 2018-05-03 DIAGNOSIS — M869 Osteomyelitis, unspecified: Secondary | ICD-10-CM

## 2018-05-03 DIAGNOSIS — G894 Chronic pain syndrome: Secondary | ICD-10-CM

## 2018-05-03 DIAGNOSIS — B9629 Other Escherichia coli [E. coli] as the cause of diseases classified elsewhere: Secondary | ICD-10-CM

## 2018-05-03 DIAGNOSIS — F5105 Insomnia due to other mental disorder: Secondary | ICD-10-CM

## 2018-05-03 DIAGNOSIS — E039 Hypothyroidism, unspecified: Secondary | ICD-10-CM

## 2018-05-03 DIAGNOSIS — I110 Hypertensive heart disease with heart failure: Secondary | ICD-10-CM

## 2018-05-03 DIAGNOSIS — F603 Borderline personality disorder: Secondary | ICD-10-CM

## 2018-05-03 DIAGNOSIS — J449 Chronic obstructive pulmonary disease, unspecified: Secondary | ICD-10-CM

## 2018-05-03 DIAGNOSIS — Z87891 Personal history of nicotine dependence: Secondary | ICD-10-CM

## 2018-05-03 DIAGNOSIS — Z792 Long term (current) use of antibiotics: Secondary | ICD-10-CM

## 2018-05-03 DIAGNOSIS — F319 Bipolar disorder, unspecified: Secondary | ICD-10-CM

## 2018-05-03 DIAGNOSIS — Z6841 Body Mass Index (BMI) 40.0 and over, adult: Secondary | ICD-10-CM

## 2018-05-03 DIAGNOSIS — B9562 Methicillin resistant Staphylococcus aureus infection as the cause of diseases classified elsewhere: Secondary | ICD-10-CM

## 2018-05-03 DIAGNOSIS — Z5181 Encounter for therapeutic drug level monitoring: Secondary | ICD-10-CM

## 2018-05-03 DIAGNOSIS — E1169 Type 2 diabetes mellitus with other specified complication: Secondary | ICD-10-CM

## 2018-05-04 DIAGNOSIS — E876 Hypokalemia: Secondary | ICD-10-CM

## 2018-05-04 DIAGNOSIS — Z792 Long term (current) use of antibiotics: Secondary | ICD-10-CM

## 2018-05-04 DIAGNOSIS — F419 Anxiety disorder, unspecified: Secondary | ICD-10-CM

## 2018-05-04 DIAGNOSIS — F5105 Insomnia due to other mental disorder: Secondary | ICD-10-CM

## 2018-05-04 DIAGNOSIS — M519 Unspecified thoracic, thoracolumbar and lumbosacral intervertebral disc disorder: Secondary | ICD-10-CM

## 2018-05-04 DIAGNOSIS — Z5181 Encounter for therapeutic drug level monitoring: Secondary | ICD-10-CM

## 2018-05-04 DIAGNOSIS — I5032 Chronic diastolic (congestive) heart failure: Secondary | ICD-10-CM

## 2018-05-04 DIAGNOSIS — G4733 Obstructive sleep apnea (adult) (pediatric): Secondary | ICD-10-CM

## 2018-05-04 DIAGNOSIS — F603 Borderline personality disorder: Secondary | ICD-10-CM

## 2018-05-04 DIAGNOSIS — M869 Osteomyelitis, unspecified: Secondary | ICD-10-CM

## 2018-05-04 DIAGNOSIS — B9629 Other Escherichia coli [E. coli] as the cause of diseases classified elsewhere: Secondary | ICD-10-CM

## 2018-05-04 DIAGNOSIS — F319 Bipolar disorder, unspecified: Secondary | ICD-10-CM

## 2018-05-04 DIAGNOSIS — E1142 Type 2 diabetes mellitus with diabetic polyneuropathy: Secondary | ICD-10-CM

## 2018-05-04 DIAGNOSIS — M0579 Rheumatoid arthritis with rheumatoid factor of multiple sites without organ or systems involvement: Secondary | ICD-10-CM

## 2018-05-04 DIAGNOSIS — J449 Chronic obstructive pulmonary disease, unspecified: Secondary | ICD-10-CM

## 2018-05-04 DIAGNOSIS — B9562 Methicillin resistant Staphylococcus aureus infection as the cause of diseases classified elsewhere: Secondary | ICD-10-CM

## 2018-05-04 DIAGNOSIS — I110 Hypertensive heart disease with heart failure: Secondary | ICD-10-CM

## 2018-05-04 DIAGNOSIS — L02611 Cutaneous abscess of right foot: Secondary | ICD-10-CM

## 2018-05-04 DIAGNOSIS — E1169 Type 2 diabetes mellitus with other specified complication: Secondary | ICD-10-CM

## 2018-05-04 DIAGNOSIS — E039 Hypothyroidism, unspecified: Secondary | ICD-10-CM

## 2018-05-04 DIAGNOSIS — T8743 Infection of amputation stump, right lower extremity: Principal | ICD-10-CM

## 2018-05-04 DIAGNOSIS — G894 Chronic pain syndrome: Secondary | ICD-10-CM

## 2018-05-04 DIAGNOSIS — Z6841 Body Mass Index (BMI) 40.0 and over, adult: Secondary | ICD-10-CM

## 2018-05-04 DIAGNOSIS — Z87891 Personal history of nicotine dependence: Secondary | ICD-10-CM

## 2018-05-05 DIAGNOSIS — J449 Chronic obstructive pulmonary disease, unspecified: Secondary | ICD-10-CM

## 2018-05-05 DIAGNOSIS — T8743 Infection of amputation stump, right lower extremity: Principal | ICD-10-CM

## 2018-05-05 DIAGNOSIS — E1169 Type 2 diabetes mellitus with other specified complication: Secondary | ICD-10-CM

## 2018-05-05 DIAGNOSIS — F319 Bipolar disorder, unspecified: Secondary | ICD-10-CM

## 2018-05-05 DIAGNOSIS — E1142 Type 2 diabetes mellitus with diabetic polyneuropathy: Secondary | ICD-10-CM

## 2018-05-05 DIAGNOSIS — E039 Hypothyroidism, unspecified: Secondary | ICD-10-CM

## 2018-05-05 DIAGNOSIS — Z87891 Personal history of nicotine dependence: Secondary | ICD-10-CM

## 2018-05-05 DIAGNOSIS — L02611 Cutaneous abscess of right foot: Secondary | ICD-10-CM

## 2018-05-05 DIAGNOSIS — F419 Anxiety disorder, unspecified: Secondary | ICD-10-CM

## 2018-05-05 DIAGNOSIS — G4733 Obstructive sleep apnea (adult) (pediatric): Secondary | ICD-10-CM

## 2018-05-05 DIAGNOSIS — Z792 Long term (current) use of antibiotics: Secondary | ICD-10-CM

## 2018-05-05 DIAGNOSIS — I5032 Chronic diastolic (congestive) heart failure: Secondary | ICD-10-CM

## 2018-05-05 DIAGNOSIS — F5105 Insomnia due to other mental disorder: Secondary | ICD-10-CM

## 2018-05-05 DIAGNOSIS — E876 Hypokalemia: Secondary | ICD-10-CM

## 2018-05-05 DIAGNOSIS — M519 Unspecified thoracic, thoracolumbar and lumbosacral intervertebral disc disorder: Secondary | ICD-10-CM

## 2018-05-05 DIAGNOSIS — Z6841 Body Mass Index (BMI) 40.0 and over, adult: Secondary | ICD-10-CM

## 2018-05-05 DIAGNOSIS — Z5181 Encounter for therapeutic drug level monitoring: Secondary | ICD-10-CM

## 2018-05-05 DIAGNOSIS — F603 Borderline personality disorder: Secondary | ICD-10-CM

## 2018-05-05 DIAGNOSIS — M0579 Rheumatoid arthritis with rheumatoid factor of multiple sites without organ or systems involvement: Secondary | ICD-10-CM

## 2018-05-05 DIAGNOSIS — M869 Osteomyelitis, unspecified: Secondary | ICD-10-CM

## 2018-05-05 DIAGNOSIS — B9629 Other Escherichia coli [E. coli] as the cause of diseases classified elsewhere: Secondary | ICD-10-CM

## 2018-05-05 DIAGNOSIS — G894 Chronic pain syndrome: Secondary | ICD-10-CM

## 2018-05-05 DIAGNOSIS — I110 Hypertensive heart disease with heart failure: Secondary | ICD-10-CM

## 2018-05-05 DIAGNOSIS — B9562 Methicillin resistant Staphylococcus aureus infection as the cause of diseases classified elsewhere: Secondary | ICD-10-CM

## 2018-05-07 DIAGNOSIS — F5105 Insomnia due to other mental disorder: Secondary | ICD-10-CM

## 2018-05-07 DIAGNOSIS — G894 Chronic pain syndrome: Secondary | ICD-10-CM

## 2018-05-07 DIAGNOSIS — F319 Bipolar disorder, unspecified: Secondary | ICD-10-CM

## 2018-05-07 DIAGNOSIS — I5032 Chronic diastolic (congestive) heart failure: Secondary | ICD-10-CM

## 2018-05-07 DIAGNOSIS — Z6841 Body Mass Index (BMI) 40.0 and over, adult: Secondary | ICD-10-CM

## 2018-05-07 DIAGNOSIS — B9629 Other Escherichia coli [E. coli] as the cause of diseases classified elsewhere: Secondary | ICD-10-CM

## 2018-05-07 DIAGNOSIS — L02611 Cutaneous abscess of right foot: Secondary | ICD-10-CM

## 2018-05-07 DIAGNOSIS — G4733 Obstructive sleep apnea (adult) (pediatric): Secondary | ICD-10-CM

## 2018-05-07 DIAGNOSIS — Z87891 Personal history of nicotine dependence: Secondary | ICD-10-CM

## 2018-05-07 DIAGNOSIS — Z5181 Encounter for therapeutic drug level monitoring: Secondary | ICD-10-CM

## 2018-05-07 DIAGNOSIS — F419 Anxiety disorder, unspecified: Secondary | ICD-10-CM

## 2018-05-07 DIAGNOSIS — I110 Hypertensive heart disease with heart failure: Secondary | ICD-10-CM

## 2018-05-07 DIAGNOSIS — M0579 Rheumatoid arthritis with rheumatoid factor of multiple sites without organ or systems involvement: Secondary | ICD-10-CM

## 2018-05-07 DIAGNOSIS — B9562 Methicillin resistant Staphylococcus aureus infection as the cause of diseases classified elsewhere: Secondary | ICD-10-CM

## 2018-05-07 DIAGNOSIS — J449 Chronic obstructive pulmonary disease, unspecified: Secondary | ICD-10-CM

## 2018-05-07 DIAGNOSIS — F603 Borderline personality disorder: Secondary | ICD-10-CM

## 2018-05-07 DIAGNOSIS — E1169 Type 2 diabetes mellitus with other specified complication: Secondary | ICD-10-CM

## 2018-05-07 DIAGNOSIS — M869 Osteomyelitis, unspecified: Secondary | ICD-10-CM

## 2018-05-07 DIAGNOSIS — T8743 Infection of amputation stump, right lower extremity: Principal | ICD-10-CM

## 2018-05-07 DIAGNOSIS — E876 Hypokalemia: Secondary | ICD-10-CM

## 2018-05-07 DIAGNOSIS — E1142 Type 2 diabetes mellitus with diabetic polyneuropathy: Secondary | ICD-10-CM

## 2018-05-07 DIAGNOSIS — E039 Hypothyroidism, unspecified: Secondary | ICD-10-CM

## 2018-05-07 DIAGNOSIS — M519 Unspecified thoracic, thoracolumbar and lumbosacral intervertebral disc disorder: Secondary | ICD-10-CM

## 2018-05-07 DIAGNOSIS — Z792 Long term (current) use of antibiotics: Secondary | ICD-10-CM

## 2018-05-08 DIAGNOSIS — M519 Unspecified thoracic, thoracolumbar and lumbosacral intervertebral disc disorder: Secondary | ICD-10-CM

## 2018-05-08 DIAGNOSIS — F419 Anxiety disorder, unspecified: Secondary | ICD-10-CM

## 2018-05-08 DIAGNOSIS — B9562 Methicillin resistant Staphylococcus aureus infection as the cause of diseases classified elsewhere: Secondary | ICD-10-CM

## 2018-05-08 DIAGNOSIS — F319 Bipolar disorder, unspecified: Secondary | ICD-10-CM

## 2018-05-08 DIAGNOSIS — Z5181 Encounter for therapeutic drug level monitoring: Secondary | ICD-10-CM

## 2018-05-08 DIAGNOSIS — M869 Osteomyelitis, unspecified: Secondary | ICD-10-CM

## 2018-05-08 DIAGNOSIS — G4733 Obstructive sleep apnea (adult) (pediatric): Secondary | ICD-10-CM

## 2018-05-08 DIAGNOSIS — M0579 Rheumatoid arthritis with rheumatoid factor of multiple sites without organ or systems involvement: Secondary | ICD-10-CM

## 2018-05-08 DIAGNOSIS — B9629 Other Escherichia coli [E. coli] as the cause of diseases classified elsewhere: Secondary | ICD-10-CM

## 2018-05-08 DIAGNOSIS — L02611 Cutaneous abscess of right foot: Secondary | ICD-10-CM

## 2018-05-08 DIAGNOSIS — J449 Chronic obstructive pulmonary disease, unspecified: Secondary | ICD-10-CM

## 2018-05-08 DIAGNOSIS — I110 Hypertensive heart disease with heart failure: Secondary | ICD-10-CM

## 2018-05-08 DIAGNOSIS — T8743 Infection of amputation stump, right lower extremity: Principal | ICD-10-CM

## 2018-05-08 DIAGNOSIS — I5032 Chronic diastolic (congestive) heart failure: Secondary | ICD-10-CM

## 2018-05-08 DIAGNOSIS — E039 Hypothyroidism, unspecified: Secondary | ICD-10-CM

## 2018-05-08 DIAGNOSIS — F603 Borderline personality disorder: Secondary | ICD-10-CM

## 2018-05-08 DIAGNOSIS — E1142 Type 2 diabetes mellitus with diabetic polyneuropathy: Secondary | ICD-10-CM

## 2018-05-08 DIAGNOSIS — G894 Chronic pain syndrome: Secondary | ICD-10-CM

## 2018-05-08 DIAGNOSIS — E1169 Type 2 diabetes mellitus with other specified complication: Secondary | ICD-10-CM

## 2018-05-08 DIAGNOSIS — E876 Hypokalemia: Secondary | ICD-10-CM

## 2018-05-08 DIAGNOSIS — Z87891 Personal history of nicotine dependence: Secondary | ICD-10-CM

## 2018-05-08 DIAGNOSIS — Z792 Long term (current) use of antibiotics: Secondary | ICD-10-CM

## 2018-05-08 DIAGNOSIS — F5105 Insomnia due to other mental disorder: Secondary | ICD-10-CM

## 2018-05-08 DIAGNOSIS — Z6841 Body Mass Index (BMI) 40.0 and over, adult: Secondary | ICD-10-CM

## 2018-05-09 ENCOUNTER — Ambulatory Visit: Admit: 2018-05-09 | Discharge: 2018-05-10 | Payer: MEDICAID

## 2018-05-09 DIAGNOSIS — M869 Osteomyelitis, unspecified: Principal | ICD-10-CM

## 2018-05-10 DIAGNOSIS — B9629 Other Escherichia coli [E. coli] as the cause of diseases classified elsewhere: Secondary | ICD-10-CM

## 2018-05-10 DIAGNOSIS — Z792 Long term (current) use of antibiotics: Secondary | ICD-10-CM

## 2018-05-10 DIAGNOSIS — E1142 Type 2 diabetes mellitus with diabetic polyneuropathy: Secondary | ICD-10-CM

## 2018-05-10 DIAGNOSIS — M0579 Rheumatoid arthritis with rheumatoid factor of multiple sites without organ or systems involvement: Secondary | ICD-10-CM

## 2018-05-10 DIAGNOSIS — Z87891 Personal history of nicotine dependence: Secondary | ICD-10-CM

## 2018-05-10 DIAGNOSIS — E1169 Type 2 diabetes mellitus with other specified complication: Secondary | ICD-10-CM

## 2018-05-10 DIAGNOSIS — F319 Bipolar disorder, unspecified: Secondary | ICD-10-CM

## 2018-05-10 DIAGNOSIS — F5105 Insomnia due to other mental disorder: Secondary | ICD-10-CM

## 2018-05-10 DIAGNOSIS — I5032 Chronic diastolic (congestive) heart failure: Secondary | ICD-10-CM

## 2018-05-10 DIAGNOSIS — L02611 Cutaneous abscess of right foot: Secondary | ICD-10-CM

## 2018-05-10 DIAGNOSIS — M869 Osteomyelitis, unspecified: Secondary | ICD-10-CM

## 2018-05-10 DIAGNOSIS — J449 Chronic obstructive pulmonary disease, unspecified: Secondary | ICD-10-CM

## 2018-05-10 DIAGNOSIS — G894 Chronic pain syndrome: Secondary | ICD-10-CM

## 2018-05-10 DIAGNOSIS — B9562 Methicillin resistant Staphylococcus aureus infection as the cause of diseases classified elsewhere: Secondary | ICD-10-CM

## 2018-05-10 DIAGNOSIS — Z5181 Encounter for therapeutic drug level monitoring: Secondary | ICD-10-CM

## 2018-05-10 DIAGNOSIS — F419 Anxiety disorder, unspecified: Secondary | ICD-10-CM

## 2018-05-10 DIAGNOSIS — M519 Unspecified thoracic, thoracolumbar and lumbosacral intervertebral disc disorder: Secondary | ICD-10-CM

## 2018-05-10 DIAGNOSIS — F603 Borderline personality disorder: Secondary | ICD-10-CM

## 2018-05-10 DIAGNOSIS — E876 Hypokalemia: Secondary | ICD-10-CM

## 2018-05-10 DIAGNOSIS — E039 Hypothyroidism, unspecified: Secondary | ICD-10-CM

## 2018-05-10 DIAGNOSIS — T8743 Infection of amputation stump, right lower extremity: Principal | ICD-10-CM

## 2018-05-10 DIAGNOSIS — Z6841 Body Mass Index (BMI) 40.0 and over, adult: Secondary | ICD-10-CM

## 2018-05-10 DIAGNOSIS — G4733 Obstructive sleep apnea (adult) (pediatric): Secondary | ICD-10-CM

## 2018-05-10 DIAGNOSIS — I110 Hypertensive heart disease with heart failure: Secondary | ICD-10-CM

## 2018-05-12 ENCOUNTER — Telehealth: Payer: Self-pay

## 2018-05-12 ENCOUNTER — Other Ambulatory Visit: Payer: Self-pay | Admitting: Psychiatry

## 2018-05-12 DIAGNOSIS — Z5181 Encounter for therapeutic drug level monitoring: Secondary | ICD-10-CM

## 2018-05-12 DIAGNOSIS — E1142 Type 2 diabetes mellitus with diabetic polyneuropathy: Secondary | ICD-10-CM

## 2018-05-12 DIAGNOSIS — Z6841 Body Mass Index (BMI) 40.0 and over, adult: Secondary | ICD-10-CM

## 2018-05-12 DIAGNOSIS — E876 Hypokalemia: Secondary | ICD-10-CM

## 2018-05-12 DIAGNOSIS — B9562 Methicillin resistant Staphylococcus aureus infection as the cause of diseases classified elsewhere: Secondary | ICD-10-CM

## 2018-05-12 DIAGNOSIS — I110 Hypertensive heart disease with heart failure: Secondary | ICD-10-CM

## 2018-05-12 DIAGNOSIS — I5032 Chronic diastolic (congestive) heart failure: Secondary | ICD-10-CM

## 2018-05-12 DIAGNOSIS — Z87891 Personal history of nicotine dependence: Secondary | ICD-10-CM

## 2018-05-12 DIAGNOSIS — G894 Chronic pain syndrome: Secondary | ICD-10-CM

## 2018-05-12 DIAGNOSIS — L02611 Cutaneous abscess of right foot: Secondary | ICD-10-CM

## 2018-05-12 DIAGNOSIS — F5105 Insomnia due to other mental disorder: Secondary | ICD-10-CM

## 2018-05-12 DIAGNOSIS — G4733 Obstructive sleep apnea (adult) (pediatric): Secondary | ICD-10-CM

## 2018-05-12 DIAGNOSIS — B9629 Other Escherichia coli [E. coli] as the cause of diseases classified elsewhere: Secondary | ICD-10-CM

## 2018-05-12 DIAGNOSIS — M869 Osteomyelitis, unspecified: Secondary | ICD-10-CM

## 2018-05-12 DIAGNOSIS — F603 Borderline personality disorder: Secondary | ICD-10-CM

## 2018-05-12 DIAGNOSIS — F319 Bipolar disorder, unspecified: Secondary | ICD-10-CM

## 2018-05-12 DIAGNOSIS — M0579 Rheumatoid arthritis with rheumatoid factor of multiple sites without organ or systems involvement: Secondary | ICD-10-CM

## 2018-05-12 DIAGNOSIS — E039 Hypothyroidism, unspecified: Secondary | ICD-10-CM

## 2018-05-12 DIAGNOSIS — M519 Unspecified thoracic, thoracolumbar and lumbosacral intervertebral disc disorder: Secondary | ICD-10-CM

## 2018-05-12 DIAGNOSIS — J449 Chronic obstructive pulmonary disease, unspecified: Secondary | ICD-10-CM

## 2018-05-12 DIAGNOSIS — T8743 Infection of amputation stump, right lower extremity: Principal | ICD-10-CM

## 2018-05-12 DIAGNOSIS — Z792 Long term (current) use of antibiotics: Secondary | ICD-10-CM

## 2018-05-12 DIAGNOSIS — F419 Anxiety disorder, unspecified: Secondary | ICD-10-CM

## 2018-05-12 DIAGNOSIS — E1169 Type 2 diabetes mellitus with other specified complication: Secondary | ICD-10-CM

## 2018-05-12 MED ORDER — ZOLPIDEM TARTRATE 5 MG PO TABS: ORAL_TABLET | ORAL | 5 refills | Status: DC

## 2018-05-12 NOTE — Telephone Encounter (Signed)
Pt called states that the pharmacy does not have the rx.   (appears that the e-prescription failed)  Pt also wanted to changed pharmacy to [facility].  (I went ahead and changed the pharmacy in the system )   zolpidem (AMBIEN) 5 MG tablet  Medication  Date: 04/24/2018 Department: [facility] Ordering/Authorizing: Clapacs, Jackquline [place], MD  Order Providers   Prescribing Provider Encounter Provider  Clapacs, Jackquline [place], MD Clapacs, Jackquline [place], MD  Outpatient Medication Detail    Disp Refills Start End   zolpidem (AMBIEN) 5 MG tablet 30 tablet 5 04/24/2018    Sig: TAKE 1 TABLET BY MOUTH EVERYDAY AT BEDTIME   Sent to pharmacy as: zolpidem (AMBIEN) 5 MG tablet   Notes to Pharmacy: This request is for a new prescription for a controlled substance as required by Federal/State law.PT REQUEST NEW RX FOR ZOLPIDEM TARTRATE 5 MG TABLET.   E-Prescribing Status: Transmission to pharmacy failed (04/24/2018 4:03 PM EST)

## 2018-05-16 ENCOUNTER — Ambulatory Visit
Admission: EM | Admit: 2018-05-16 | Discharge: 2018-05-16 | Disposition: A | Discharge status: Home / Self Care | Payer: Medicaid Other | Attending: Family Medicine | Admitting: Family Medicine

## 2018-05-16 ENCOUNTER — Encounter: Payer: Self-pay | Admitting: Emergency Medicine

## 2018-05-16 ENCOUNTER — Other Ambulatory Visit: Payer: Self-pay

## 2018-05-16 DIAGNOSIS — N390 Urinary tract infection, site not specified: Secondary | ICD-10-CM

## 2018-05-16 DIAGNOSIS — B9689 Other specified bacterial agents as the cause of diseases classified elsewhere: Secondary | ICD-10-CM | POA: Diagnosis not present

## 2018-05-16 DIAGNOSIS — I10 Essential (primary) hypertension: Secondary | ICD-10-CM

## 2018-05-16 LAB — URINALYSIS, COMPLETE (UACMP) WITH MICROSCOPIC
Bilirubin Urine: NEGATIVE
Glucose, UA: >1000 mg/dL — AB
Hgb urine dipstick: NEGATIVE
Ketones, ur: NEGATIVE mg/dL
Nitrite: POSITIVE — AB
Protein, ur: NEGATIVE mg/dL
Specific Gravity, Urine: 1.015 (ref 1.005–1.030)
WBC, UA: >50 WBC/hpf (ref 0–5)
pH: 5.5 (ref 5.0–8.0)

## 2018-05-16 MED ORDER — CEPHALEXIN 500 MG PO CAPS: 500.0000 mg | ORAL_CAPSULE | Freq: Two times a day (BID) | ORAL | 0 refills | Status: DC

## 2018-05-16 MED ORDER — PHENAZOPYRIDINE HCL 200 MG PO TABS: 200.0000 mg | ORAL_TABLET | Freq: Three times a day (TID) | ORAL | 0 refills | Status: DC

## 2018-05-16 NOTE — ED Provider Notes (Signed)
[facility]    CSN: [id number] Arrival date & time: 05/16/18  1454     History   Chief Complaint Chief Complaint  Patient presents with  . Dysuria    HPI [name] [name] is a 58 y.o. female.   HPI  58 year old female presents today with dysuria and frequency that she has had for over 4 days.  She is had no fever or chills.  She denies any nausea or vomiting.  She has had no significant backache.  She has had no vaginal discharge.  She is tried over-the-counter medications which have not been beneficial.       Past Medical History:  Diagnosis Date  . Abdominal wall hernia 01/29/2013  . Anxiety   . Arthritis    Rheumatoid  . C. difficile colitis   . Chronic diastolic heart failure (HCC)   . Depression   . Diabetes mellitus    states no meds or diet restrictions  at present  . Diastolic CHF (HCC)   . Esophagitis   . Fluid retention   . GERD (gastroesophageal reflux disease)   . Hiatal hernia   . Hypertension   . Hypokalemia due to loss of potassium 10/21/2015   Overview:  Associated with 3 weeks of diarrhea  And QT prolongation.  . Hypothyroidism   . IBS (irritable bowel syndrome)   . Moderate episode of recurrent major depressive disorder (HCC) 06/03/2004  . Morbid obesity (HCC)   . MRSA (methicillin resistant Staphylococcus aureus) infection 11/2017   left inner thigh abcess  . Neurogenic bladder    has pacemaker  . Neuropathy   . Obesity   . Panic attacks   . Rheumatoid arthritis (HCC)   . Sleep apnea    STATES SEVERE, CANT TOLERATE MASK- LAST STUDY YEARS AGO    Patient Active Problem List   Diagnosis Date Noted  . Decubitus ulcer of heel, bilateral 02/21/2018  . Diabetic ulcer of toe of right foot associated with type 2 diabetes mellitus (HCC) 02/14/2018  . Ulcer of left heel and midfoot with fat layer exposed (HCC) 02/14/2018  . MRSA (methicillin resistant Staphylococcus aureus) infection 11/17/2017  . Cellulitis 11/15/2017  . Perineal  abscess 11/15/2017  . Subacute vulvitis 11/01/2017  . Chronic cystitis 03/01/2017  . Chronic pain syndrome 03/31/2016  . Insomnia secondary to chronic pain 03/31/2016  . Chronic upper back pain 12/25/2015  . Chronic hand pain (Bilateral) (L>R) 12/25/2015  . Rheumatoid arthritis (HCC) 12/25/2015  . Osteoarthritis, multiple sites 12/24/2015  . Chronic foot pain (Location of Primary Source of Pain) (Bilateral) (L>R) 12/24/2015  . Chronic elbow pain (Location of Tertiary source of pain) (Bilateral) (L>R) 12/24/2015  . Chronic shoulder pain (Bilateral) (L>R) 12/24/2015  . Chronic neck pain (Bilateral) (R>L) 12/24/2015  . 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[employer]) 12/23/2015  . Chronic knee pain (Bilateral) (R>L) 12/23/2015  . Long term current use of opiate analgesic 12/23/2015  . Long term prescription opiate use 12/23/2015  . Opiate use (30 MME/Day) 12/23/2015  . Neurogenic pain 12/23/2015  . Neuropathic pain 12/23/2015  . Diabetic peripheral neuropathy (HCC) 12/23/2015  . Encounter for therapeutic drug level monitoring 12/23/2015  . Encounter for pain management planning 12/23/2015  . GERD (gastroesophageal reflux disease) 11/25/2015  . Neuropathy 11/25/2015  . Hypokalemia 10/21/2015  . Hypomagnesemia 10/21/2015  . QT prolongation 10/21/2015  . 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 Chronic wrist pain (Location of Secondary source of pain) (Bilateral) (L>R) 03/19/2015  .  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 Female genuine stress incontinence 02/14/2015  . Urge incontinence of urine 02/14/2015  . Obstructive apnea 07/03/2014  . Chronic diastolic CHF (congestive heart failure), NYHA class 3 (HCC) 07/03/2014  . Anxiety 01/03/2014  . Diabetic ulcer of heel (HCC) 01/03/2014  . COPD (chronic obstructive pulmonary disease) (HCC) 01/03/2014  . Bipolar disorder, unspecified (HCC) 01/03/2014  . Diastolic dysfunction 01/03/2014  . Combined fat  and carbohydrate induced hyperlipemia 01/03/2014  . Shortness of breath 01/03/2014  . Obesity 01/03/2014  . Acute on chronic congestive heart failure (HCC) 01/03/2014  . Incomplete bladder emptying 11/02/2012  . Bladder retention 10/10/2012  . Detrusor muscle hypertonia 10/04/2012  . Obstruction of urinary tract 10/04/2012  . FOM (frequency of micturition) 10/04/2012  . Mixed incontinence 10/04/2012  . Vitamin D deficiency 04/15/2012  . Borderline personality disorder (HCC) 01/06/2012  . Mixed anxiety depressive disorder 01/06/2012  . History of laparoscopic adjustable gastric banding, 03/20/2007.  Removed 09/19/2011. 08/04/2011  . Hypothyroidism 06/28/2010  . Essential (primary) hypertension 01/27/2005  . Major depressive disorder, recurrent episode, moderate (HCC) 06/03/2004    Past Surgical History:  Procedure Laterality Date  . ABDOMINAL HYSTERECTOMY    . CHOLECYSTECTOMY    . DG GREAT TOE RIGHT FOOT  02/23/2018  . EYE SURGERY     bilateral cataract extraction with IOL  . HERNIA REPAIR     ventral hernia with strangulation  . LAPAROSCOPIC GASTRIC BANDING  03/20/07  . TONSILLECTOMY    . TUBAL LIGATION      OB History   No obstetric history on file.      Home Medications    Prior to Admission medications   Medication Sig Start Date End Date Taking? Authorizing Provider  ARIPiprazole (ABILIFY) 5 MG tablet Take by mouth. 08/22/12  Yes [provider]  atorvastatin (LIPITOR) 80 MG tablet Take by mouth. 02/09/13  Yes [provider]  B-D INSULIN SYRINGE 1CC/25G 25G X 5/8" 1 ML MISC 1 UNITS BY MISCELLANEOUS ROUTE EVERY SEVEN (7) DAYS. TO BE USED WITH METHOTREXATE 12/10/16  Yes [provider]  busPIRone (BUSPAR) 10 MG tablet Take 1 tablet (10 mg total) by mouth 2 (two) times daily. 04/25/18  Yes Clapacs, Jackquline Denmark, MD  Calcium Carb-Ergocalciferol 250-125 MG-UNIT TABS Take by mouth.   Yes [provider]  Cholecalciferol (VITAMIN D-1000 MAX ST) 1000  units tablet Take by mouth.   Yes [provider]  clotrimazole-betamethasone (LOTRISONE) cream Apply 1 application topically 2 (two) times daily. For yeast infection under stomach 05/07/11  Yes [provider]  Diclofenac Sodium 3 % GEL APPLY 1 APPLICATION TOPICALLY TWO (2) TIMES A DAY. 06/21/17  Yes [provider]  FLUoxetine (PROZAC) 20 MG capsule Take 1 capsule (20 mg total) by mouth daily. 04/25/18  Yes Clapacs, Jackquline Denmark, MD  folic acid (FOLVITE) 1 MG tablet Take 1 mg by mouth daily.    Yes [provider]  hydroxychloroquine (PLAQUENIL) 200 MG tablet Take 200 mg by mouth 2 (two) times daily.   Yes [provider]  hydrOXYzine (ATARAX/VISTARIL) 50 MG tablet Take 50 mg by mouth Twice daily.  07/07/11  Yes [provider]  insulin lispro (HUMALOG) 100 UNIT/ML injection Inject 10u AC TID and add SSI: <150=0u, 151-200=2u, 201-250=4u, 251-300=6u, >300mg /dl=8u. Up to 60u daily. 10/31/17  Yes [provider]  KLOR-CON M20 20 MEQ tablet TAKE ONE TABLET BY MOUTH TWICE DAILY Patient taking differently: take 2 tabs in the a.m. , 1 tab in the  evening 04/10/13  Yes [place] OuchArida, Muhammad A, MD  LANTUS 100 UNIT/ML injection INJECT 0.2 ML (20 UNITS TOTAL) UNDER THE SKIN NIGHTLY. 10/02/17  Yes [provider]  leflunomide (ARAVA) 10 MG tablet Take 10 mg by mouth daily. 04/27/17  Yes [provider]  levothyroxine (SYNTHROID, LEVOTHROID) 88 MCG tablet Take 88 mcg by mouth daily before breakfast.   Yes [provider]  lisinopril (PRINIVIL,ZESTRIL) 2.5 MG tablet Take 2.5 mg by mouth daily.   Yes [provider]  loperamide (IMODIUM) 2 MG capsule Take 2 mg by mouth as needed.  06/21/16  Yes [provider]  lovastatin (MEVACOR) 40 MG tablet Take 40 mg by mouth daily.    Yes [provider]  Melatonin 10 MG CAPS Take by mouth. 05/30/16  Yes [provider]  metFORMIN (GLUCOPHAGE) 500 MG tablet Take 500 mg by  mouth 2 (two) times daily with a meal.    Yes [provider]  methotrexate 50 MG/2ML injection Inject into the muscle.   Yes [provider]  oxyCODONE (OXY IR/ROXICODONE) 5 MG immediate release tablet Take 1 tablet (5 mg total) by mouth every 6 (six) hours as needed for severe pain. 06/07/18 07/07/18 Yes Barbette MerinoKing, Crystal M, NP  oxyCODONE (OXY IR/ROXICODONE) 5 MG immediate release tablet Take 1 tablet (5 mg total) by mouth every 6 (six) hours as needed for severe pain. 05/08/18 06/07/18 Yes Barbette MerinoKing, Crystal M, NP  oxyCODONE-acetaminophen (PERCOCET/ROXICET) 5-325 MG tablet Take by mouth.   Yes [provider]  pregabalin (LYRICA) 150 MG capsule Take 1 capsule (150 mg total) by mouth 3 (three) times daily. 03/23/18 06/21/18 Yes King, Shana Chuterystal M, NP  promethazine (PHENERGAN) 25 MG tablet TAKE 1 TABLET BY MOUTH EVERY 6 HOURS AS NEEDED NAUSEA OR VOMITING 10/18/17  Yes [provider]  QUEtiapine (SEROQUEL) 300 MG tablet TAKE 1 TABLET BY MOUTH EVERYDAY AT BEDTIME 04/25/18  Yes Clapacs, Jackquline DenmarkJohn T, MD  Vitamin D, Ergocalciferol, (DRISDOL) 50000 units CAPS capsule Take by mouth.   Yes [provider]  zolpidem (AMBIEN) 5 MG tablet TAKE 1 TABLET BY MOUTH EVERYDAY AT BEDTIME 05/12/18  Yes Clapacs, Jackquline DenmarkJohn T, MD  cephALEXin (KEFLEX) 500 MG capsule Take 1 capsule (500 mg total) by mouth 2 (two) times daily. 05/16/18   Lutricia Feiloemer, P[place]oua Schreiner P, PA-C  phenazopyridine (PYRIDIUM) 200 MG tablet Take 1 tablet (200 mg total) by mouth 3 (three) times daily. 05/16/18   Lutricia Feiloemer, Finleigh Cheong P, PA-C    Family History Family History  Problem Relation Age of Onset  . Heart failure Father   . Bipolar disorder Father   . Alcohol abuse Father   . Anxiety disorder Father   . Depression Father   . Heart disease Brother   . Heart attack Brother [phone]       MI s/p stents placed  . Anxiety disorder Sister   . Depression Sister   . Anxiety disorder Sister   . Depression Sister   . Bipolar disorder Sister   . Alcohol  abuse Sister   . Drug abuse Sister   . Heart attack Brother     Social History Social History   Tobacco Use  . Smoking status: Former Smoker    Packs/day: 2.00    Years: 27.00    Pack years: 54.00    Types: Cigarettes    Last attempt to quit: 07/30/1999    Years since quitting: 18.8  . Smokeless tobacco: Never Used  Substance Use Topics  . Alcohol use: No  .  Drug use: No     Allergies   Codeine; Propoxyphene; Sulfa antibiotics; and Hydrocodone   Review of Systems Review of Systems  Constitutional: Positive for activity change. Negative for appetite change, chills, fatigue and fever.  Genitourinary: Positive for dysuria, frequency and urgency. Negative for vaginal discharge.     Physical Exam Triage Vital Signs ED Triage Vitals  Enc Vitals Group     BP 05/16/18 1517 (!) 141/87     Pulse Rate 05/16/18 1517 79     Resp 05/16/18 1517 16     Temp 05/16/18 1517 98.5 F (36.9 C)     Temp Source 05/16/18 1517 Oral     SpO2 05/16/18 1517 96 %     Weight 05/16/18 1516 (!) 325 lb (147.4 kg)     Height 05/16/18 1516 5\' 6"  (1.676 m)     Head Circumference --      Peak Flow --      Pain Score 05/16/18 1516 4     Pain Loc --      Pain Edu? --      Excl. in GC? --    No data found.  Updated Vital Signs BP (!) 141/87 (BP Location: Left Arm)   Pulse 79   Temp 98.5 F (36.9 C) (Oral)   Resp 16   Ht 5\' 6"  (1.676 m)   Wt (!) 325 lb (147.4 kg)   LMP 04/20/2001   SpO2 96%   BMI 52.46 kg/m   Visual Acuity Right Eye Distance:   Left Eye Distance:   Bilateral Distance:    Right Eye Near:   Left Eye Near:    Bilateral Near:     Physical Exam Vitals signs and nursing note reviewed.  Constitutional:      General: She is not in acute distress.    Appearance: Normal appearance. She is obese. She is not ill-appearing, toxic-appearing or diaphoretic.  HENT:     Head: Normocephalic.     Nose: Nose normal.     Mouth/Throat:     Mouth: Mucous membranes are moist.    Eyes:     General:        Right eye: No discharge.        Left eye: No discharge.     Conjunctiva/sclera: Conjunctivae normal.  Neck:     Musculoskeletal: Normal range of motion and neck supple.  Pulmonary:     Effort: Pulmonary effort is normal.     Breath sounds: Normal breath sounds.  Musculoskeletal: Normal range of motion.  Lymphadenopathy:     Cervical: No cervical adenopathy.  Skin:    General: Skin is warm and dry.  Neurological:     General: No focal deficit present.     Mental Status: She is alert and oriented to person, place, and time.  Psychiatric:        Mood and Affect: Mood normal.        Behavior: Behavior normal.        Thought Content: Thought content normal.        Judgment: Judgment normal.      UC Treatments / Results  Labs (all labs ordered are listed, but only abnormal results are displayed) Labs Reviewed  URINALYSIS, COMPLETE (UACMP) WITH MICROSCOPIC - Abnormal; Notable for the following components:      Result Value   APPearance CLOUDY (*)    Glucose, UA >1000 (*)    Nitrite POSITIVE (*)    Leukocytes, UA TRACE (*)  Bacteria, UA MANY (*)    All other components within normal limits  URINE CULTURE    EKG None  Radiology No results found.  Procedures Procedures (including critical care time)  Medications Ordered in UC Medications - No data to display  Initial Impression / Assessment and Plan / UC Course  I have reviewed the triage vital signs and the nursing notes.  Pertinent labs & imaging results that were available during my care of the patient were reviewed by me and considered in my medical decision making (see chart for details).   Likely has a urinary tract infection.  Place her on Keflex twice daily for 5 days and Pyridium.  We will culture her urine.  If there is any change in the cultures and sensitivities she will be notified and adjustments made accordingly.   Final Clinical Impressions(s) / UC Diagnoses   Final  diagnoses:  Lower urinary tract infectious disease   Discharge Instructions   None    ED Prescriptions    Medication Sig Dispense Auth. Provider   cephALEXin (KEFLEX) 500 MG capsule Take 1 capsule (500 mg total) by mouth 2 (two) times daily. 10 capsule Ovid Curd P, PA-C   phenazopyridine (PYRIDIUM) 200 MG tablet Take 1 tablet (200 mg total) by mouth 3 (three) times daily. 6 tablet Lutricia Feil, PA-C     Controlled Substance Prescriptions [place] Controlled Substance Registry consulted? Not Applicable   Lutricia Feil, PA-C 05/16/18 1718

## 2018-05-16 NOTE — ED Triage Notes (Signed)
Patient in today c/o dysuria and urinary frequency x 4 days. Patient denies fever. Patient has not tried any OTC medications.

## 2018-05-17 DIAGNOSIS — M869 Osteomyelitis, unspecified: Secondary | ICD-10-CM

## 2018-05-17 DIAGNOSIS — E1169 Type 2 diabetes mellitus with other specified complication: Secondary | ICD-10-CM

## 2018-05-17 DIAGNOSIS — L02611 Cutaneous abscess of right foot: Secondary | ICD-10-CM

## 2018-05-17 DIAGNOSIS — T8743 Infection of amputation stump, right lower extremity: Principal | ICD-10-CM

## 2018-05-17 DIAGNOSIS — G894 Chronic pain syndrome: Secondary | ICD-10-CM

## 2018-05-17 DIAGNOSIS — E876 Hypokalemia: Secondary | ICD-10-CM

## 2018-05-17 DIAGNOSIS — F5105 Insomnia due to other mental disorder: Secondary | ICD-10-CM

## 2018-05-17 DIAGNOSIS — F419 Anxiety disorder, unspecified: Secondary | ICD-10-CM

## 2018-05-17 DIAGNOSIS — F319 Bipolar disorder, unspecified: Secondary | ICD-10-CM

## 2018-05-17 DIAGNOSIS — M519 Unspecified thoracic, thoracolumbar and lumbosacral intervertebral disc disorder: Secondary | ICD-10-CM

## 2018-05-17 DIAGNOSIS — I110 Hypertensive heart disease with heart failure: Secondary | ICD-10-CM

## 2018-05-17 DIAGNOSIS — B9562 Methicillin resistant Staphylococcus aureus infection as the cause of diseases classified elsewhere: Secondary | ICD-10-CM

## 2018-05-17 DIAGNOSIS — E1142 Type 2 diabetes mellitus with diabetic polyneuropathy: Secondary | ICD-10-CM

## 2018-05-17 DIAGNOSIS — B9629 Other Escherichia coli [E. coli] as the cause of diseases classified elsewhere: Secondary | ICD-10-CM

## 2018-05-17 DIAGNOSIS — Z5181 Encounter for therapeutic drug level monitoring: Secondary | ICD-10-CM

## 2018-05-17 DIAGNOSIS — E039 Hypothyroidism, unspecified: Secondary | ICD-10-CM

## 2018-05-17 DIAGNOSIS — G4733 Obstructive sleep apnea (adult) (pediatric): Secondary | ICD-10-CM

## 2018-05-17 DIAGNOSIS — Z792 Long term (current) use of antibiotics: Secondary | ICD-10-CM

## 2018-05-17 DIAGNOSIS — I5032 Chronic diastolic (congestive) heart failure: Secondary | ICD-10-CM

## 2018-05-17 DIAGNOSIS — Z6841 Body Mass Index (BMI) 40.0 and over, adult: Secondary | ICD-10-CM

## 2018-05-17 DIAGNOSIS — M0579 Rheumatoid arthritis with rheumatoid factor of multiple sites without organ or systems involvement: Secondary | ICD-10-CM

## 2018-05-17 DIAGNOSIS — Z87891 Personal history of nicotine dependence: Secondary | ICD-10-CM

## 2018-05-17 DIAGNOSIS — J449 Chronic obstructive pulmonary disease, unspecified: Secondary | ICD-10-CM

## 2018-05-17 DIAGNOSIS — F603 Borderline personality disorder: Secondary | ICD-10-CM

## 2018-05-19 ENCOUNTER — Telehealth (HOSPITAL_COMMUNITY): Payer: Self-pay | Admitting: Emergency Medicine

## 2018-05-19 ENCOUNTER — Ambulatory Visit
Admit: 2018-05-19 | Discharge: 2018-05-19 | Payer: MEDICAID | Attending: Foot & Ankle Surgery | Primary: Foot & Ankle Surgery

## 2018-05-19 DIAGNOSIS — J449 Chronic obstructive pulmonary disease, unspecified: Secondary | ICD-10-CM

## 2018-05-19 DIAGNOSIS — F419 Anxiety disorder, unspecified: Secondary | ICD-10-CM

## 2018-05-19 DIAGNOSIS — E039 Hypothyroidism, unspecified: Secondary | ICD-10-CM

## 2018-05-19 DIAGNOSIS — G4733 Obstructive sleep apnea (adult) (pediatric): Secondary | ICD-10-CM

## 2018-05-19 DIAGNOSIS — E1142 Type 2 diabetes mellitus with diabetic polyneuropathy: Secondary | ICD-10-CM

## 2018-05-19 DIAGNOSIS — M0579 Rheumatoid arthritis with rheumatoid factor of multiple sites without organ or systems involvement: Secondary | ICD-10-CM

## 2018-05-19 DIAGNOSIS — Z87891 Personal history of nicotine dependence: Secondary | ICD-10-CM

## 2018-05-19 DIAGNOSIS — L02611 Cutaneous abscess of right foot: Secondary | ICD-10-CM

## 2018-05-19 DIAGNOSIS — F319 Bipolar disorder, unspecified: Secondary | ICD-10-CM

## 2018-05-19 DIAGNOSIS — F5105 Insomnia due to other mental disorder: Secondary | ICD-10-CM

## 2018-05-19 DIAGNOSIS — B9629 Other Escherichia coli [E. coli] as the cause of diseases classified elsewhere: Secondary | ICD-10-CM

## 2018-05-19 DIAGNOSIS — Z89419 Acquired absence of unspecified great toe: Secondary | ICD-10-CM

## 2018-05-19 DIAGNOSIS — F603 Borderline personality disorder: Secondary | ICD-10-CM

## 2018-05-19 DIAGNOSIS — I110 Hypertensive heart disease with heart failure: Secondary | ICD-10-CM

## 2018-05-19 DIAGNOSIS — T8743 Infection of amputation stump, right lower extremity: Principal | ICD-10-CM

## 2018-05-19 DIAGNOSIS — I5032 Chronic diastolic (congestive) heart failure: Secondary | ICD-10-CM

## 2018-05-19 DIAGNOSIS — L97412 Non-pressure chronic ulcer of right heel and midfoot with fat layer exposed: Secondary | ICD-10-CM

## 2018-05-19 DIAGNOSIS — Z792 Long term (current) use of antibiotics: Secondary | ICD-10-CM

## 2018-05-19 DIAGNOSIS — B9562 Methicillin resistant Staphylococcus aureus infection as the cause of diseases classified elsewhere: Secondary | ICD-10-CM

## 2018-05-19 DIAGNOSIS — M869 Osteomyelitis, unspecified: Secondary | ICD-10-CM

## 2018-05-19 DIAGNOSIS — Z6841 Body Mass Index (BMI) 40.0 and over, adult: Secondary | ICD-10-CM

## 2018-05-19 DIAGNOSIS — E876 Hypokalemia: Secondary | ICD-10-CM

## 2018-05-19 DIAGNOSIS — E11621 Type 2 diabetes mellitus with foot ulcer: Principal | ICD-10-CM

## 2018-05-19 DIAGNOSIS — Z5181 Encounter for therapeutic drug level monitoring: Secondary | ICD-10-CM

## 2018-05-19 DIAGNOSIS — M519 Unspecified thoracic, thoracolumbar and lumbosacral intervertebral disc disorder: Secondary | ICD-10-CM

## 2018-05-19 DIAGNOSIS — E1169 Type 2 diabetes mellitus with other specified complication: Secondary | ICD-10-CM

## 2018-05-19 DIAGNOSIS — G894 Chronic pain syndrome: Secondary | ICD-10-CM

## 2018-05-19 LAB — URINE CULTURE: Culture: >=100000 — AB

## 2018-05-19 NOTE — Telephone Encounter (Signed)
Urine culture was positive for KLEBSIELLA PNEUMONIAE and was given keflex  at urgent care visit. Pt contacted and made aware, educated on completing antibiotic and to follow up if symptoms are persistent. Verbalized understanding.    

## 2018-05-23 DIAGNOSIS — G4733 Obstructive sleep apnea (adult) (pediatric): Secondary | ICD-10-CM

## 2018-05-23 DIAGNOSIS — I5032 Chronic diastolic (congestive) heart failure: Secondary | ICD-10-CM

## 2018-05-23 DIAGNOSIS — F419 Anxiety disorder, unspecified: Secondary | ICD-10-CM

## 2018-05-23 DIAGNOSIS — I110 Hypertensive heart disease with heart failure: Secondary | ICD-10-CM

## 2018-05-23 DIAGNOSIS — F319 Bipolar disorder, unspecified: Secondary | ICD-10-CM

## 2018-05-23 DIAGNOSIS — Z6841 Body Mass Index (BMI) 40.0 and over, adult: Secondary | ICD-10-CM

## 2018-05-23 DIAGNOSIS — E039 Hypothyroidism, unspecified: Secondary | ICD-10-CM

## 2018-05-23 DIAGNOSIS — M869 Osteomyelitis, unspecified: Secondary | ICD-10-CM

## 2018-05-23 DIAGNOSIS — Z792 Long term (current) use of antibiotics: Secondary | ICD-10-CM

## 2018-05-23 DIAGNOSIS — M519 Unspecified thoracic, thoracolumbar and lumbosacral intervertebral disc disorder: Secondary | ICD-10-CM

## 2018-05-23 DIAGNOSIS — Z87891 Personal history of nicotine dependence: Secondary | ICD-10-CM

## 2018-05-23 DIAGNOSIS — E876 Hypokalemia: Secondary | ICD-10-CM

## 2018-05-23 DIAGNOSIS — E1142 Type 2 diabetes mellitus with diabetic polyneuropathy: Secondary | ICD-10-CM

## 2018-05-23 DIAGNOSIS — B9629 Other Escherichia coli [E. coli] as the cause of diseases classified elsewhere: Secondary | ICD-10-CM

## 2018-05-23 DIAGNOSIS — M0579 Rheumatoid arthritis with rheumatoid factor of multiple sites without organ or systems involvement: Secondary | ICD-10-CM

## 2018-05-23 DIAGNOSIS — T8743 Infection of amputation stump, right lower extremity: Principal | ICD-10-CM

## 2018-05-23 DIAGNOSIS — F603 Borderline personality disorder: Secondary | ICD-10-CM

## 2018-05-23 DIAGNOSIS — B9562 Methicillin resistant Staphylococcus aureus infection as the cause of diseases classified elsewhere: Secondary | ICD-10-CM

## 2018-05-23 DIAGNOSIS — E1169 Type 2 diabetes mellitus with other specified complication: Secondary | ICD-10-CM

## 2018-05-23 DIAGNOSIS — Z5181 Encounter for therapeutic drug level monitoring: Secondary | ICD-10-CM

## 2018-05-23 DIAGNOSIS — G894 Chronic pain syndrome: Secondary | ICD-10-CM

## 2018-05-23 DIAGNOSIS — L02611 Cutaneous abscess of right foot: Secondary | ICD-10-CM

## 2018-05-23 DIAGNOSIS — F5105 Insomnia due to other mental disorder: Secondary | ICD-10-CM

## 2018-05-23 DIAGNOSIS — J449 Chronic obstructive pulmonary disease, unspecified: Secondary | ICD-10-CM

## 2018-05-25 ENCOUNTER — Ambulatory Visit: Payer: Medicaid Other | Admitting: Psychiatry

## 2018-05-25 DIAGNOSIS — G894 Chronic pain syndrome: Secondary | ICD-10-CM

## 2018-05-25 DIAGNOSIS — Z792 Long term (current) use of antibiotics: Secondary | ICD-10-CM

## 2018-05-25 DIAGNOSIS — T8743 Infection of amputation stump, right lower extremity: Principal | ICD-10-CM

## 2018-05-25 DIAGNOSIS — E876 Hypokalemia: Secondary | ICD-10-CM

## 2018-05-25 DIAGNOSIS — B9562 Methicillin resistant Staphylococcus aureus infection as the cause of diseases classified elsewhere: Secondary | ICD-10-CM

## 2018-05-25 DIAGNOSIS — M519 Unspecified thoracic, thoracolumbar and lumbosacral intervertebral disc disorder: Secondary | ICD-10-CM

## 2018-05-25 DIAGNOSIS — E039 Hypothyroidism, unspecified: Secondary | ICD-10-CM

## 2018-05-25 DIAGNOSIS — Z6841 Body Mass Index (BMI) 40.0 and over, adult: Secondary | ICD-10-CM

## 2018-05-25 DIAGNOSIS — F603 Borderline personality disorder: Secondary | ICD-10-CM

## 2018-05-25 DIAGNOSIS — E1169 Type 2 diabetes mellitus with other specified complication: Secondary | ICD-10-CM

## 2018-05-25 DIAGNOSIS — F5105 Insomnia due to other mental disorder: Secondary | ICD-10-CM

## 2018-05-25 DIAGNOSIS — F319 Bipolar disorder, unspecified: Secondary | ICD-10-CM

## 2018-05-25 DIAGNOSIS — F419 Anxiety disorder, unspecified: Secondary | ICD-10-CM

## 2018-05-25 DIAGNOSIS — M0579 Rheumatoid arthritis with rheumatoid factor of multiple sites without organ or systems involvement: Secondary | ICD-10-CM

## 2018-05-25 DIAGNOSIS — M869 Osteomyelitis, unspecified: Secondary | ICD-10-CM

## 2018-05-25 DIAGNOSIS — Z5181 Encounter for therapeutic drug level monitoring: Secondary | ICD-10-CM

## 2018-05-25 DIAGNOSIS — Z87891 Personal history of nicotine dependence: Secondary | ICD-10-CM

## 2018-05-25 DIAGNOSIS — J449 Chronic obstructive pulmonary disease, unspecified: Secondary | ICD-10-CM

## 2018-05-25 DIAGNOSIS — L02611 Cutaneous abscess of right foot: Secondary | ICD-10-CM

## 2018-05-25 DIAGNOSIS — B9629 Other Escherichia coli [E. coli] as the cause of diseases classified elsewhere: Secondary | ICD-10-CM

## 2018-05-25 DIAGNOSIS — I110 Hypertensive heart disease with heart failure: Secondary | ICD-10-CM

## 2018-05-25 DIAGNOSIS — G4733 Obstructive sleep apnea (adult) (pediatric): Secondary | ICD-10-CM

## 2018-05-25 DIAGNOSIS — I5032 Chronic diastolic (congestive) heart failure: Secondary | ICD-10-CM

## 2018-05-25 DIAGNOSIS — E1142 Type 2 diabetes mellitus with diabetic polyneuropathy: Secondary | ICD-10-CM

## 2018-05-30 DIAGNOSIS — E039 Hypothyroidism, unspecified: Secondary | ICD-10-CM

## 2018-05-30 DIAGNOSIS — J449 Chronic obstructive pulmonary disease, unspecified: Secondary | ICD-10-CM

## 2018-05-30 DIAGNOSIS — M0579 Rheumatoid arthritis with rheumatoid factor of multiple sites without organ or systems involvement: Secondary | ICD-10-CM

## 2018-05-30 DIAGNOSIS — L02611 Cutaneous abscess of right foot: Secondary | ICD-10-CM

## 2018-05-30 DIAGNOSIS — Z5181 Encounter for therapeutic drug level monitoring: Secondary | ICD-10-CM

## 2018-05-30 DIAGNOSIS — F419 Anxiety disorder, unspecified: Secondary | ICD-10-CM

## 2018-05-30 DIAGNOSIS — E1142 Type 2 diabetes mellitus with diabetic polyneuropathy: Secondary | ICD-10-CM

## 2018-05-30 DIAGNOSIS — Z792 Long term (current) use of antibiotics: Secondary | ICD-10-CM

## 2018-05-30 DIAGNOSIS — G4733 Obstructive sleep apnea (adult) (pediatric): Secondary | ICD-10-CM

## 2018-05-30 DIAGNOSIS — E876 Hypokalemia: Secondary | ICD-10-CM

## 2018-05-30 DIAGNOSIS — E1169 Type 2 diabetes mellitus with other specified complication: Secondary | ICD-10-CM

## 2018-05-30 DIAGNOSIS — I110 Hypertensive heart disease with heart failure: Secondary | ICD-10-CM

## 2018-05-30 DIAGNOSIS — G894 Chronic pain syndrome: Secondary | ICD-10-CM

## 2018-05-30 DIAGNOSIS — M519 Unspecified thoracic, thoracolumbar and lumbosacral intervertebral disc disorder: Secondary | ICD-10-CM

## 2018-05-30 DIAGNOSIS — F5105 Insomnia due to other mental disorder: Secondary | ICD-10-CM

## 2018-05-30 DIAGNOSIS — B9562 Methicillin resistant Staphylococcus aureus infection as the cause of diseases classified elsewhere: Secondary | ICD-10-CM

## 2018-05-30 DIAGNOSIS — M869 Osteomyelitis, unspecified: Secondary | ICD-10-CM

## 2018-05-30 DIAGNOSIS — I5032 Chronic diastolic (congestive) heart failure: Secondary | ICD-10-CM

## 2018-05-30 DIAGNOSIS — B9629 Other Escherichia coli [E. coli] as the cause of diseases classified elsewhere: Secondary | ICD-10-CM

## 2018-05-30 DIAGNOSIS — Z6841 Body Mass Index (BMI) 40.0 and over, adult: Secondary | ICD-10-CM

## 2018-05-30 DIAGNOSIS — F319 Bipolar disorder, unspecified: Secondary | ICD-10-CM

## 2018-05-30 DIAGNOSIS — T8743 Infection of amputation stump, right lower extremity: Principal | ICD-10-CM

## 2018-05-30 DIAGNOSIS — F603 Borderline personality disorder: Secondary | ICD-10-CM

## 2018-05-30 DIAGNOSIS — Z87891 Personal history of nicotine dependence: Secondary | ICD-10-CM

## 2018-05-30 MED ORDER — INSULIN LISPRO (U-100) 100 UNIT/ML SUBCUTANEOUS SOLUTION
Freq: Three times a day (TID) | SUBCUTANEOUS | 1 refills | 0 days | Status: SS
Start: 2018-05-30 — End: 2018-06-13

## 2018-06-01 ENCOUNTER — Ambulatory Visit (INDEPENDENT_AMBULATORY_CARE_PROVIDER_SITE_OTHER): Payer: Medicaid Other | Admitting: Psychiatry

## 2018-06-01 ENCOUNTER — Other Ambulatory Visit: Payer: Self-pay

## 2018-06-01 ENCOUNTER — Encounter: Payer: Self-pay | Admitting: Psychiatry

## 2018-06-01 VITALS — BP 156/96 | HR 96 | Temp 99.2°F | Wt 315.6 lb

## 2018-06-01 DIAGNOSIS — T8743 Infection of amputation stump, right lower extremity: Principal | ICD-10-CM

## 2018-06-01 DIAGNOSIS — E039 Hypothyroidism, unspecified: Secondary | ICD-10-CM

## 2018-06-01 DIAGNOSIS — M869 Osteomyelitis, unspecified: Secondary | ICD-10-CM

## 2018-06-01 DIAGNOSIS — F603 Borderline personality disorder: Secondary | ICD-10-CM

## 2018-06-01 DIAGNOSIS — J449 Chronic obstructive pulmonary disease, unspecified: Secondary | ICD-10-CM

## 2018-06-01 DIAGNOSIS — Z6841 Body Mass Index (BMI) 40.0 and over, adult: Secondary | ICD-10-CM

## 2018-06-01 DIAGNOSIS — F319 Bipolar disorder, unspecified: Secondary | ICD-10-CM

## 2018-06-01 DIAGNOSIS — G4733 Obstructive sleep apnea (adult) (pediatric): Secondary | ICD-10-CM

## 2018-06-01 DIAGNOSIS — G894 Chronic pain syndrome: Secondary | ICD-10-CM

## 2018-06-01 DIAGNOSIS — B9629 Other Escherichia coli [E. coli] as the cause of diseases classified elsewhere: Secondary | ICD-10-CM

## 2018-06-01 DIAGNOSIS — E1142 Type 2 diabetes mellitus with diabetic polyneuropathy: Secondary | ICD-10-CM

## 2018-06-01 DIAGNOSIS — M0579 Rheumatoid arthritis with rheumatoid factor of multiple sites without organ or systems involvement: Secondary | ICD-10-CM

## 2018-06-01 DIAGNOSIS — F5105 Insomnia due to other mental disorder: Secondary | ICD-10-CM

## 2018-06-01 DIAGNOSIS — I5032 Chronic diastolic (congestive) heart failure: Secondary | ICD-10-CM

## 2018-06-01 DIAGNOSIS — E1169 Type 2 diabetes mellitus with other specified complication: Secondary | ICD-10-CM

## 2018-06-01 DIAGNOSIS — Z792 Long term (current) use of antibiotics: Secondary | ICD-10-CM

## 2018-06-01 DIAGNOSIS — M519 Unspecified thoracic, thoracolumbar and lumbosacral intervertebral disc disorder: Secondary | ICD-10-CM

## 2018-06-01 DIAGNOSIS — L02611 Cutaneous abscess of right foot: Secondary | ICD-10-CM

## 2018-06-01 DIAGNOSIS — Z87891 Personal history of nicotine dependence: Secondary | ICD-10-CM

## 2018-06-01 DIAGNOSIS — E876 Hypokalemia: Secondary | ICD-10-CM

## 2018-06-01 DIAGNOSIS — I110 Hypertensive heart disease with heart failure: Secondary | ICD-10-CM

## 2018-06-01 DIAGNOSIS — F419 Anxiety disorder, unspecified: Secondary | ICD-10-CM

## 2018-06-01 DIAGNOSIS — B9562 Methicillin resistant Staphylococcus aureus infection as the cause of diseases classified elsewhere: Secondary | ICD-10-CM

## 2018-06-01 DIAGNOSIS — Z5181 Encounter for therapeutic drug level monitoring: Secondary | ICD-10-CM

## 2018-06-01 DIAGNOSIS — F411 Generalized anxiety disorder: Secondary | ICD-10-CM

## 2018-06-01 DIAGNOSIS — M792 Neuralgia and neuritis, unspecified: Secondary | ICD-10-CM

## 2018-06-01 DIAGNOSIS — F331 Major depressive disorder, recurrent, moderate: Secondary | ICD-10-CM | POA: Diagnosis not present

## 2018-06-01 MED ORDER — QUETIAPINE FUMARATE 300 MG PO TABS: ORAL_TABLET | ORAL | 1 refills | Status: DC

## 2018-06-01 MED ORDER — ARIPIPRAZOLE 5 MG PO TABS: 5.0000 mg | ORAL_TABLET | Freq: Every day | ORAL | 1 refills | Status: DC

## 2018-06-01 MED ORDER — FLUOXETINE HCL 20 MG PO CAPS: 20.0000 mg | ORAL_CAPSULE | Freq: Every day | ORAL | 1 refills | Status: DC

## 2018-06-01 MED ORDER — BUSPIRONE HCL 10 MG PO TABS: 10.0000 mg | ORAL_TABLET | Freq: Two times a day (BID) | ORAL | 1 refills | Status: DC

## 2018-06-01 MED ORDER — PREGABALIN 150 MG PO CAPS: 150.0000 mg | ORAL_CAPSULE | Freq: Three times a day (TID) | ORAL | 1 refills | Status: DC

## 2018-06-01 MED ORDER — ZOLPIDEM TARTRATE 5 MG PO TABS: ORAL_TABLET | ORAL | 1 refills | Status: DC

## 2018-06-01 NOTE — Progress Notes (Signed)
Follow-up patient with chronic depression.  Since I saw her last she had to have a toe amputated because of diabetes.  She is slowly recovering from that.  Mood however is staying pretty good.  No spells of major depression.  No evidence suicidality.  No psychosis.  Neatly dressed and groomed woman good eye contact normal behavior.  No suicidal or homicidal ideation.  Good judgment and insight.  No indication to change medicine.  Review all medicines as prescribed supportive counseling completed follow-up in another 6 months.

## 2018-06-05 DIAGNOSIS — M869 Osteomyelitis, unspecified: Secondary | ICD-10-CM

## 2018-06-05 DIAGNOSIS — E876 Hypokalemia: Secondary | ICD-10-CM

## 2018-06-05 DIAGNOSIS — Z87891 Personal history of nicotine dependence: Secondary | ICD-10-CM

## 2018-06-05 DIAGNOSIS — M0579 Rheumatoid arthritis with rheumatoid factor of multiple sites without organ or systems involvement: Secondary | ICD-10-CM

## 2018-06-05 DIAGNOSIS — F5105 Insomnia due to other mental disorder: Secondary | ICD-10-CM

## 2018-06-05 DIAGNOSIS — Z5181 Encounter for therapeutic drug level monitoring: Secondary | ICD-10-CM

## 2018-06-05 DIAGNOSIS — Z792 Long term (current) use of antibiotics: Secondary | ICD-10-CM

## 2018-06-05 DIAGNOSIS — E1142 Type 2 diabetes mellitus with diabetic polyneuropathy: Secondary | ICD-10-CM

## 2018-06-05 DIAGNOSIS — G4733 Obstructive sleep apnea (adult) (pediatric): Secondary | ICD-10-CM

## 2018-06-05 DIAGNOSIS — T8743 Infection of amputation stump, right lower extremity: Principal | ICD-10-CM

## 2018-06-05 DIAGNOSIS — E039 Hypothyroidism, unspecified: Secondary | ICD-10-CM

## 2018-06-05 DIAGNOSIS — L02611 Cutaneous abscess of right foot: Secondary | ICD-10-CM

## 2018-06-05 DIAGNOSIS — J449 Chronic obstructive pulmonary disease, unspecified: Secondary | ICD-10-CM

## 2018-06-05 DIAGNOSIS — I5032 Chronic diastolic (congestive) heart failure: Secondary | ICD-10-CM

## 2018-06-05 DIAGNOSIS — G894 Chronic pain syndrome: Secondary | ICD-10-CM

## 2018-06-05 DIAGNOSIS — Z6841 Body Mass Index (BMI) 40.0 and over, adult: Secondary | ICD-10-CM

## 2018-06-05 DIAGNOSIS — I110 Hypertensive heart disease with heart failure: Secondary | ICD-10-CM

## 2018-06-05 DIAGNOSIS — F419 Anxiety disorder, unspecified: Secondary | ICD-10-CM

## 2018-06-05 DIAGNOSIS — B9562 Methicillin resistant Staphylococcus aureus infection as the cause of diseases classified elsewhere: Secondary | ICD-10-CM

## 2018-06-05 DIAGNOSIS — F319 Bipolar disorder, unspecified: Secondary | ICD-10-CM

## 2018-06-05 DIAGNOSIS — F603 Borderline personality disorder: Secondary | ICD-10-CM

## 2018-06-05 DIAGNOSIS — E1169 Type 2 diabetes mellitus with other specified complication: Secondary | ICD-10-CM

## 2018-06-05 DIAGNOSIS — B9629 Other Escherichia coli [E. coli] as the cause of diseases classified elsewhere: Secondary | ICD-10-CM

## 2018-06-05 DIAGNOSIS — M519 Unspecified thoracic, thoracolumbar and lumbosacral intervertebral disc disorder: Secondary | ICD-10-CM

## 2018-06-08 DIAGNOSIS — Z5181 Encounter for therapeutic drug level monitoring: Secondary | ICD-10-CM

## 2018-06-08 DIAGNOSIS — F319 Bipolar disorder, unspecified: Secondary | ICD-10-CM

## 2018-06-08 DIAGNOSIS — B9629 Other Escherichia coli [E. coli] as the cause of diseases classified elsewhere: Secondary | ICD-10-CM

## 2018-06-08 DIAGNOSIS — E876 Hypokalemia: Secondary | ICD-10-CM

## 2018-06-08 DIAGNOSIS — L02611 Cutaneous abscess of right foot: Secondary | ICD-10-CM

## 2018-06-08 DIAGNOSIS — E039 Hypothyroidism, unspecified: Secondary | ICD-10-CM

## 2018-06-08 DIAGNOSIS — B9562 Methicillin resistant Staphylococcus aureus infection as the cause of diseases classified elsewhere: Secondary | ICD-10-CM

## 2018-06-08 DIAGNOSIS — M0579 Rheumatoid arthritis with rheumatoid factor of multiple sites without organ or systems involvement: Secondary | ICD-10-CM

## 2018-06-08 DIAGNOSIS — Z6841 Body Mass Index (BMI) 40.0 and over, adult: Secondary | ICD-10-CM

## 2018-06-08 DIAGNOSIS — J449 Chronic obstructive pulmonary disease, unspecified: Secondary | ICD-10-CM

## 2018-06-08 DIAGNOSIS — Z792 Long term (current) use of antibiotics: Secondary | ICD-10-CM

## 2018-06-08 DIAGNOSIS — G894 Chronic pain syndrome: Secondary | ICD-10-CM

## 2018-06-08 DIAGNOSIS — I110 Hypertensive heart disease with heart failure: Secondary | ICD-10-CM

## 2018-06-08 DIAGNOSIS — M519 Unspecified thoracic, thoracolumbar and lumbosacral intervertebral disc disorder: Secondary | ICD-10-CM

## 2018-06-08 DIAGNOSIS — Z87891 Personal history of nicotine dependence: Secondary | ICD-10-CM

## 2018-06-08 DIAGNOSIS — E1142 Type 2 diabetes mellitus with diabetic polyneuropathy: Secondary | ICD-10-CM

## 2018-06-08 DIAGNOSIS — M869 Osteomyelitis, unspecified: Secondary | ICD-10-CM

## 2018-06-08 DIAGNOSIS — I5032 Chronic diastolic (congestive) heart failure: Secondary | ICD-10-CM

## 2018-06-08 DIAGNOSIS — E1169 Type 2 diabetes mellitus with other specified complication: Secondary | ICD-10-CM

## 2018-06-08 DIAGNOSIS — F603 Borderline personality disorder: Secondary | ICD-10-CM

## 2018-06-08 DIAGNOSIS — F5105 Insomnia due to other mental disorder: Secondary | ICD-10-CM

## 2018-06-08 DIAGNOSIS — G4733 Obstructive sleep apnea (adult) (pediatric): Secondary | ICD-10-CM

## 2018-06-08 DIAGNOSIS — F419 Anxiety disorder, unspecified: Secondary | ICD-10-CM

## 2018-06-08 DIAGNOSIS — T8743 Infection of amputation stump, right lower extremity: Principal | ICD-10-CM

## 2018-06-10 ENCOUNTER — Ambulatory Visit: Admit: 2018-06-10 | Discharge: 2018-06-13 | Disposition: A | Discharge status: Home / Self Care | Payer: MEDICAID

## 2018-06-10 DIAGNOSIS — M069 Rheumatoid arthritis, unspecified: Principal | ICD-10-CM

## 2018-06-13 MED ORDER — INSULIN LISPRO (U-100) 100 UNIT/ML SUBCUTANEOUS SOLUTION
Freq: Three times a day (TID) | SUBCUTANEOUS | 0 refills | 0 days | Status: SS
Start: 2018-06-13 — End: 2018-06-16

## 2018-06-13 MED ORDER — INSULIN LISPRO (U-100) 100 UNIT/ML SUBCUTANEOUS SOLUTION: 18 [IU] | mL | Freq: Three times a day (TID) | 0 refills | 0 days | Status: SS

## 2018-06-13 MED ORDER — OXYCODONE 5 MG TABLET
ORAL_TABLET | Freq: Four times a day (QID) | ORAL | 0 refills | 0 days | Status: CP | PRN
Start: 2018-06-13 — End: 2018-08-27
  Filled 2018-06-18: qty 10, 2d supply, fill #0

## 2018-06-13 MED ORDER — INSULIN GLARGINE (U-100) 100 UNIT/ML SUBCUTANEOUS SOLUTION
Freq: Every day | SUBCUTANEOUS | 0 refills | 0 days | Status: SS
Start: 2018-06-13 — End: 2018-06-16

## 2018-06-14 ENCOUNTER — Encounter
Admit: 2018-06-14 | Discharge: 2018-06-16 | Disposition: A | Discharge status: Home Health | Payer: MEDICAID | Source: Ambulatory Visit | Admitting: Surgery

## 2018-06-14 ENCOUNTER — Ambulatory Visit: Admit: 2018-06-14 | Discharge: 2018-06-15 | Payer: MEDICAID | Attending: Family | Primary: Family

## 2018-06-14 ENCOUNTER — Ambulatory Visit
Admit: 2018-06-14 | Discharge: 2018-06-16 | Disposition: A | Discharge status: Home Health | Payer: MEDICAID | Source: Ambulatory Visit | Admitting: Surgery

## 2018-06-14 DIAGNOSIS — L0231 Cutaneous abscess of buttock: Principal | ICD-10-CM

## 2018-06-14 MED ORDER — PREDNISONE 10 MG TABLET
ORAL_TABLET | ORAL | 0 refills | 0 days | Status: SS
Start: 2018-06-14 — End: 2018-06-16

## 2018-06-16 DIAGNOSIS — F419 Anxiety disorder, unspecified: Secondary | ICD-10-CM

## 2018-06-16 DIAGNOSIS — Z8614 Personal history of Methicillin resistant Staphylococcus aureus infection: Secondary | ICD-10-CM

## 2018-06-16 DIAGNOSIS — Z9181 History of falling: Secondary | ICD-10-CM

## 2018-06-16 DIAGNOSIS — F319 Bipolar disorder, unspecified: Secondary | ICD-10-CM

## 2018-06-16 DIAGNOSIS — M519 Unspecified thoracic, thoracolumbar and lumbosacral intervertebral disc disorder: Secondary | ICD-10-CM

## 2018-06-16 DIAGNOSIS — Z6841 Body Mass Index (BMI) 40.0 and over, adult: Secondary | ICD-10-CM

## 2018-06-16 DIAGNOSIS — M0579 Rheumatoid arthritis with rheumatoid factor of multiple sites without organ or systems involvement: Secondary | ICD-10-CM

## 2018-06-16 DIAGNOSIS — F603 Borderline personality disorder: Secondary | ICD-10-CM

## 2018-06-16 DIAGNOSIS — L02416 Cutaneous abscess of left lower limb: Principal | ICD-10-CM

## 2018-06-16 DIAGNOSIS — G894 Chronic pain syndrome: Secondary | ICD-10-CM

## 2018-06-16 DIAGNOSIS — I5032 Chronic diastolic (congestive) heart failure: Secondary | ICD-10-CM

## 2018-06-16 DIAGNOSIS — E039 Hypothyroidism, unspecified: Secondary | ICD-10-CM

## 2018-06-16 DIAGNOSIS — K219 Gastro-esophageal reflux disease without esophagitis: Secondary | ICD-10-CM

## 2018-06-16 DIAGNOSIS — E1142 Type 2 diabetes mellitus with diabetic polyneuropathy: Secondary | ICD-10-CM

## 2018-06-16 DIAGNOSIS — F5105 Insomnia due to other mental disorder: Secondary | ICD-10-CM

## 2018-06-16 DIAGNOSIS — Z8631 Personal history of diabetic foot ulcer: Secondary | ICD-10-CM

## 2018-06-16 DIAGNOSIS — Z87891 Personal history of nicotine dependence: Secondary | ICD-10-CM

## 2018-06-16 DIAGNOSIS — N318 Other neuromuscular dysfunction of bladder: Secondary | ICD-10-CM

## 2018-06-16 DIAGNOSIS — I110 Hypertensive heart disease with heart failure: Secondary | ICD-10-CM

## 2018-06-16 DIAGNOSIS — G4733 Obstructive sleep apnea (adult) (pediatric): Secondary | ICD-10-CM

## 2018-06-16 DIAGNOSIS — Z794 Long term (current) use of insulin: Secondary | ICD-10-CM

## 2018-06-16 DIAGNOSIS — E1165 Type 2 diabetes mellitus with hyperglycemia: Secondary | ICD-10-CM

## 2018-06-16 DIAGNOSIS — J449 Chronic obstructive pulmonary disease, unspecified: Secondary | ICD-10-CM

## 2018-06-16 MED ORDER — PREDNISONE 10 MG TABLET
ORAL_TABLET | ORAL | 0 refills | 0 days | Status: SS
Start: 2018-06-16 — End: 2018-06-29

## 2018-06-16 MED ORDER — INSULIN GLARGINE (U-100) 100 UNIT/ML SUBCUTANEOUS SOLUTION
Freq: Every day | SUBCUTANEOUS | 2 refills | 0.00000 days | Status: SS
Start: 2018-06-16 — End: 2018-07-04

## 2018-06-16 MED ORDER — CLINDAMYCIN HCL 300 MG CAPSULE
ORAL_CAPSULE | Freq: Three times a day (TID) | ORAL | 0 refills | 0 days | Status: SS
Start: 2018-06-16 — End: 2018-06-29

## 2018-06-16 MED ORDER — OXYCODONE 5 MG TABLET
ORAL_TABLET | ORAL | 0 refills | 0 days | Status: SS | PRN
Start: 2018-06-16 — End: 2018-06-29

## 2018-06-16 MED ORDER — INSULIN LISPRO (U-100) 100 UNIT/ML SUBCUTANEOUS SOLUTION
2 refills | 0 days | Status: CP
Start: 2018-06-16 — End: 2018-08-09
  Filled 2018-06-18: qty 20, 28d supply, fill #0

## 2018-06-17 ENCOUNTER — Inpatient Hospital Stay: Admit: 2018-06-17 | Discharge: 2018-07-16 | Payer: MEDICAID

## 2018-06-17 ENCOUNTER — Encounter: Admit: 2018-06-17 | Discharge: 2018-07-16 | Payer: MEDICAID

## 2018-06-17 DIAGNOSIS — Z87891 Personal history of nicotine dependence: Secondary | ICD-10-CM

## 2018-06-17 DIAGNOSIS — N318 Other neuromuscular dysfunction of bladder: Secondary | ICD-10-CM

## 2018-06-17 DIAGNOSIS — M519 Unspecified thoracic, thoracolumbar and lumbosacral intervertebral disc disorder: Secondary | ICD-10-CM

## 2018-06-17 DIAGNOSIS — M0579 Rheumatoid arthritis with rheumatoid factor of multiple sites without organ or systems involvement: Secondary | ICD-10-CM

## 2018-06-17 DIAGNOSIS — G4733 Obstructive sleep apnea (adult) (pediatric): Secondary | ICD-10-CM

## 2018-06-17 DIAGNOSIS — I5032 Chronic diastolic (congestive) heart failure: Secondary | ICD-10-CM

## 2018-06-17 DIAGNOSIS — Z9181 History of falling: Secondary | ICD-10-CM

## 2018-06-17 DIAGNOSIS — E1165 Type 2 diabetes mellitus with hyperglycemia: Secondary | ICD-10-CM

## 2018-06-17 DIAGNOSIS — G894 Chronic pain syndrome: Secondary | ICD-10-CM

## 2018-06-17 DIAGNOSIS — F5105 Insomnia due to other mental disorder: Secondary | ICD-10-CM

## 2018-06-17 DIAGNOSIS — Z8631 Personal history of diabetic foot ulcer: Secondary | ICD-10-CM

## 2018-06-17 DIAGNOSIS — Z794 Long term (current) use of insulin: Secondary | ICD-10-CM

## 2018-06-17 DIAGNOSIS — F319 Bipolar disorder, unspecified: Secondary | ICD-10-CM

## 2018-06-17 DIAGNOSIS — F603 Borderline personality disorder: Secondary | ICD-10-CM

## 2018-06-17 DIAGNOSIS — E039 Hypothyroidism, unspecified: Secondary | ICD-10-CM

## 2018-06-17 DIAGNOSIS — K219 Gastro-esophageal reflux disease without esophagitis: Secondary | ICD-10-CM

## 2018-06-17 DIAGNOSIS — J449 Chronic obstructive pulmonary disease, unspecified: Secondary | ICD-10-CM

## 2018-06-17 DIAGNOSIS — L02416 Cutaneous abscess of left lower limb: Principal | ICD-10-CM

## 2018-06-17 DIAGNOSIS — I110 Hypertensive heart disease with heart failure: Secondary | ICD-10-CM

## 2018-06-17 DIAGNOSIS — Z8614 Personal history of Methicillin resistant Staphylococcus aureus infection: Secondary | ICD-10-CM

## 2018-06-17 DIAGNOSIS — Z6841 Body Mass Index (BMI) 40.0 and over, adult: Secondary | ICD-10-CM

## 2018-06-17 DIAGNOSIS — F419 Anxiety disorder, unspecified: Secondary | ICD-10-CM

## 2018-06-17 DIAGNOSIS — E1142 Type 2 diabetes mellitus with diabetic polyneuropathy: Secondary | ICD-10-CM

## 2018-06-18 MED FILL — HUMALOG U-100 INSULIN 100 UNIT/ML SUBCUTANEOUS SOLUTION: 28 days supply | Qty: 20 | Fill #0 | Status: AC

## 2018-06-18 MED FILL — OXYCODONE 5 MG TABLET: 2 days supply | Qty: 10 | Fill #0 | Status: AC

## 2018-06-18 MED FILL — PREDNISONE 10 MG TABLET: 33 days supply | Qty: 42 | Fill #0 | Status: AC

## 2018-06-18 MED FILL — CLINDAMYCIN HCL 300 MG CAPSULE: 8 days supply | Qty: 24 | Fill #0 | Status: AC

## 2018-06-18 MED FILL — LANTUS U-100 INSULIN 100 UNIT/ML SUBCUTANEOUS SOLUTION: SUBCUTANEOUS | 20 days supply | Qty: 10 | Fill #0

## 2018-06-18 MED FILL — CLINDAMYCIN HCL 300 MG CAPSULE: ORAL | 8 days supply | Qty: 24 | Fill #0

## 2018-06-18 MED FILL — LANTUS U-100 INSULIN 100 UNIT/ML SUBCUTANEOUS SOLUTION: 20 days supply | Qty: 10 | Fill #0 | Status: AC

## 2018-06-19 ENCOUNTER — Encounter: Payer: Medicaid Other | Admitting: Nurse Practitioner

## 2018-06-21 DIAGNOSIS — Z87891 Personal history of nicotine dependence: Principal | ICD-10-CM

## 2018-06-21 DIAGNOSIS — Z794 Long term (current) use of insulin: Principal | ICD-10-CM

## 2018-06-21 DIAGNOSIS — L02416 Cutaneous abscess of left lower limb: Principal | ICD-10-CM

## 2018-06-21 DIAGNOSIS — F5105 Insomnia due to other mental disorder: Principal | ICD-10-CM

## 2018-06-21 DIAGNOSIS — F319 Bipolar disorder, unspecified: Principal | ICD-10-CM

## 2018-06-21 DIAGNOSIS — F603 Borderline personality disorder: Principal | ICD-10-CM

## 2018-06-21 DIAGNOSIS — F419 Anxiety disorder, unspecified: Principal | ICD-10-CM

## 2018-06-21 DIAGNOSIS — I5032 Chronic diastolic (congestive) heart failure: Principal | ICD-10-CM

## 2018-06-21 DIAGNOSIS — K219 Gastro-esophageal reflux disease without esophagitis: Principal | ICD-10-CM

## 2018-06-21 DIAGNOSIS — E039 Hypothyroidism, unspecified: Principal | ICD-10-CM

## 2018-06-21 DIAGNOSIS — G4733 Obstructive sleep apnea (adult) (pediatric): Principal | ICD-10-CM

## 2018-06-21 DIAGNOSIS — Z8631 Personal history of diabetic foot ulcer: Principal | ICD-10-CM

## 2018-06-21 DIAGNOSIS — G894 Chronic pain syndrome: Principal | ICD-10-CM

## 2018-06-21 DIAGNOSIS — E1142 Type 2 diabetes mellitus with diabetic polyneuropathy: Principal | ICD-10-CM

## 2018-06-21 DIAGNOSIS — M519 Unspecified thoracic, thoracolumbar and lumbosacral intervertebral disc disorder: Principal | ICD-10-CM

## 2018-06-21 DIAGNOSIS — M0579 Rheumatoid arthritis with rheumatoid factor of multiple sites without organ or systems involvement: Principal | ICD-10-CM

## 2018-06-21 DIAGNOSIS — E1165 Type 2 diabetes mellitus with hyperglycemia: Principal | ICD-10-CM

## 2018-06-21 DIAGNOSIS — Z6841 Body Mass Index (BMI) 40.0 and over, adult: Principal | ICD-10-CM

## 2018-06-21 DIAGNOSIS — J449 Chronic obstructive pulmonary disease, unspecified: Principal | ICD-10-CM

## 2018-06-21 DIAGNOSIS — Z9181 History of falling: Principal | ICD-10-CM

## 2018-06-21 DIAGNOSIS — Z8614 Personal history of Methicillin resistant Staphylococcus aureus infection: Principal | ICD-10-CM

## 2018-06-21 DIAGNOSIS — I110 Hypertensive heart disease with heart failure: Principal | ICD-10-CM

## 2018-06-21 DIAGNOSIS — N318 Other neuromuscular dysfunction of bladder: Principal | ICD-10-CM

## 2018-06-23 DIAGNOSIS — I5032 Chronic diastolic (congestive) heart failure: Principal | ICD-10-CM

## 2018-06-23 DIAGNOSIS — Z87891 Personal history of nicotine dependence: Principal | ICD-10-CM

## 2018-06-23 DIAGNOSIS — E1165 Type 2 diabetes mellitus with hyperglycemia: Principal | ICD-10-CM

## 2018-06-23 DIAGNOSIS — G4733 Obstructive sleep apnea (adult) (pediatric): Principal | ICD-10-CM

## 2018-06-23 DIAGNOSIS — I110 Hypertensive heart disease with heart failure: Principal | ICD-10-CM

## 2018-06-23 DIAGNOSIS — Z9181 History of falling: Principal | ICD-10-CM

## 2018-06-23 DIAGNOSIS — K219 Gastro-esophageal reflux disease without esophagitis: Principal | ICD-10-CM

## 2018-06-23 DIAGNOSIS — Z8614 Personal history of Methicillin resistant Staphylococcus aureus infection: Principal | ICD-10-CM

## 2018-06-23 DIAGNOSIS — F419 Anxiety disorder, unspecified: Principal | ICD-10-CM

## 2018-06-23 DIAGNOSIS — J449 Chronic obstructive pulmonary disease, unspecified: Principal | ICD-10-CM

## 2018-06-23 DIAGNOSIS — F319 Bipolar disorder, unspecified: Principal | ICD-10-CM

## 2018-06-23 DIAGNOSIS — M0579 Rheumatoid arthritis with rheumatoid factor of multiple sites without organ or systems involvement: Principal | ICD-10-CM

## 2018-06-23 DIAGNOSIS — Z794 Long term (current) use of insulin: Principal | ICD-10-CM

## 2018-06-23 DIAGNOSIS — G894 Chronic pain syndrome: Principal | ICD-10-CM

## 2018-06-23 DIAGNOSIS — L02416 Cutaneous abscess of left lower limb: Principal | ICD-10-CM

## 2018-06-23 DIAGNOSIS — E039 Hypothyroidism, unspecified: Principal | ICD-10-CM

## 2018-06-23 DIAGNOSIS — F603 Borderline personality disorder: Principal | ICD-10-CM

## 2018-06-23 DIAGNOSIS — N318 Other neuromuscular dysfunction of bladder: Principal | ICD-10-CM

## 2018-06-23 DIAGNOSIS — Z8631 Personal history of diabetic foot ulcer: Principal | ICD-10-CM

## 2018-06-23 DIAGNOSIS — Z6841 Body Mass Index (BMI) 40.0 and over, adult: Principal | ICD-10-CM

## 2018-06-23 DIAGNOSIS — M519 Unspecified thoracic, thoracolumbar and lumbosacral intervertebral disc disorder: Principal | ICD-10-CM

## 2018-06-23 DIAGNOSIS — E1142 Type 2 diabetes mellitus with diabetic polyneuropathy: Principal | ICD-10-CM

## 2018-06-23 DIAGNOSIS — F5105 Insomnia due to other mental disorder: Principal | ICD-10-CM

## 2018-06-26 ENCOUNTER — Ambulatory Visit: Admit: 2018-06-26 | Discharge: 2018-06-27 | Payer: MEDICAID | Attending: Rheumatology | Primary: Rheumatology

## 2018-06-26 DIAGNOSIS — E039 Hypothyroidism, unspecified: Principal | ICD-10-CM

## 2018-06-26 DIAGNOSIS — N318 Other neuromuscular dysfunction of bladder: Principal | ICD-10-CM

## 2018-06-26 DIAGNOSIS — N3946 Mixed incontinence: Principal | ICD-10-CM

## 2018-06-26 DIAGNOSIS — R928 Other abnormal and inconclusive findings on diagnostic imaging of breast: Principal | ICD-10-CM

## 2018-06-26 DIAGNOSIS — N139 Obstructive and reflux uropathy, unspecified: Principal | ICD-10-CM

## 2018-06-26 DIAGNOSIS — M069 Rheumatoid arthritis, unspecified: Principal | ICD-10-CM

## 2018-06-26 DIAGNOSIS — E1169 Type 2 diabetes mellitus with other specified complication: Principal | ICD-10-CM

## 2018-06-26 DIAGNOSIS — M1990 Unspecified osteoarthritis, unspecified site: Principal | ICD-10-CM

## 2018-06-26 DIAGNOSIS — R350 Frequency of micturition: Principal | ICD-10-CM

## 2018-06-26 DIAGNOSIS — E669 Obesity, unspecified: Principal | ICD-10-CM

## 2018-06-26 DIAGNOSIS — I509 Heart failure, unspecified: Principal | ICD-10-CM

## 2018-06-26 DIAGNOSIS — F419 Anxiety disorder, unspecified: Principal | ICD-10-CM

## 2018-06-26 DIAGNOSIS — IMO0002 DDD (degenerative disc disease): Principal | ICD-10-CM

## 2018-06-26 DIAGNOSIS — G4730 Sleep apnea, unspecified: Principal | ICD-10-CM

## 2018-06-26 DIAGNOSIS — Z6841 Body Mass Index (BMI) 40.0 and over, adult: Principal | ICD-10-CM

## 2018-06-26 MED ORDER — PREDNISONE 10 MG TABLET
0 refills | 0 days | Status: CP
Start: 2018-06-26 — End: 2018-06-28

## 2018-06-26 MED ORDER — FOLIC ACID 1 MG TABLET
ORAL_TABLET | Freq: Every day | ORAL | 3 refills | 0.00000 days | Status: CP
Start: 2018-06-26 — End: 2018-06-28

## 2018-06-27 ENCOUNTER — Ambulatory Visit
Admission: EM | Admit: 2018-06-27 | Discharge: 2018-06-27 | Disposition: A | Discharge status: Home / Self Care | Payer: Medicaid Other | Attending: Family Medicine | Admitting: Family Medicine

## 2018-06-27 ENCOUNTER — Other Ambulatory Visit: Payer: Self-pay

## 2018-06-27 ENCOUNTER — Encounter: Payer: Self-pay | Admitting: Emergency Medicine

## 2018-06-27 ENCOUNTER — Ambulatory Visit: Admit: 2018-06-27 | Discharge: 2018-06-28 | Payer: MEDICAID

## 2018-06-27 DIAGNOSIS — N318 Other neuromuscular dysfunction of bladder: Principal | ICD-10-CM

## 2018-06-27 DIAGNOSIS — L02419 Cutaneous abscess of limb, unspecified: Principal | ICD-10-CM

## 2018-06-27 DIAGNOSIS — E669 Obesity, unspecified: Principal | ICD-10-CM

## 2018-06-27 DIAGNOSIS — R350 Frequency of micturition: Principal | ICD-10-CM

## 2018-06-27 DIAGNOSIS — N3946 Mixed incontinence: Principal | ICD-10-CM

## 2018-06-27 DIAGNOSIS — M1990 Unspecified osteoarthritis, unspecified site: Principal | ICD-10-CM

## 2018-06-27 DIAGNOSIS — N139 Obstructive and reflux uropathy, unspecified: Principal | ICD-10-CM

## 2018-06-27 DIAGNOSIS — F419 Anxiety disorder, unspecified: Principal | ICD-10-CM

## 2018-06-27 DIAGNOSIS — R928 Other abnormal and inconclusive findings on diagnostic imaging of breast: Principal | ICD-10-CM

## 2018-06-27 DIAGNOSIS — E039 Hypothyroidism, unspecified: Principal | ICD-10-CM

## 2018-06-27 DIAGNOSIS — I509 Heart failure, unspecified: Principal | ICD-10-CM

## 2018-06-27 DIAGNOSIS — IMO0002 DDD (degenerative disc disease): Principal | ICD-10-CM

## 2018-06-27 DIAGNOSIS — M069 Rheumatoid arthritis, unspecified: Principal | ICD-10-CM

## 2018-06-27 DIAGNOSIS — E1169 Type 2 diabetes mellitus with other specified complication: Principal | ICD-10-CM

## 2018-06-27 DIAGNOSIS — G4730 Sleep apnea, unspecified: Principal | ICD-10-CM

## 2018-06-27 DIAGNOSIS — N3000 Acute cystitis without hematuria: Secondary | ICD-10-CM

## 2018-06-27 LAB — URINALYSIS, COMPLETE (UACMP) WITH MICROSCOPIC
Bilirubin Urine: NEGATIVE
Glucose, UA: >1000 mg/dL — AB
Ketones, ur: NEGATIVE mg/dL
Nitrite: POSITIVE — AB
Protein, ur: NEGATIVE mg/dL
Specific Gravity, Urine: 1.01 (ref 1.005–1.030)
WBC, UA: >50 WBC/hpf (ref 0–5)
pH: 5 (ref 5.0–8.0)

## 2018-06-27 MED ORDER — CEPHALEXIN 500 MG PO CAPS: 500.0000 mg | ORAL_CAPSULE | Freq: Two times a day (BID) | ORAL | 0 refills | Status: DC

## 2018-06-27 MED ORDER — GABAPENTIN 300 MG CAPSULE
ORAL_CAPSULE | Freq: Three times a day (TID) | ORAL | 3 refills | 0.00000 days | Status: SS
Start: 2018-06-27 — End: 2018-06-29

## 2018-06-27 MED ORDER — IBUPROFEN 200 MG TABLET
ORAL_TABLET | Freq: Three times a day (TID) | ORAL | 0 refills | 0.00000 days | Status: SS
Start: 2018-06-27 — End: 2018-06-29

## 2018-06-27 NOTE — ED Provider Notes (Signed)
[facility]    CSN: [id number] Arrival date & time: 06/27/18  1110  History   Chief Complaint Chief Complaint  Patient presents with  . Dysuria   HPI  58 year old female presents with urinary symptoms.  Patient reports that she has a history of recurrent UTI.  She reports symptoms started yesterday.  She reports dysuria and urinary frequency as well as urgency.  No documented fever.  She does endorse chills.  No abdominal pain.  No medications or interventions tried.  No known exacerbating factors.  No other complaints.  PMH, Surgical Hx, Family Hx, Social History reviewed and updated as below.  Past Medical History:  Diagnosis Date  . Abdominal wall hernia 01/29/2013  . Anxiety   . Arthritis    Rheumatoid  . C. difficile colitis   . Chronic diastolic heart failure (HCC)   . Depression   . Diabetes mellitus    states no meds or diet restrictions  at present  . Diastolic CHF (HCC)   . Esophagitis   . Fluid retention   . GERD (gastroesophageal reflux disease)   . Hiatal hernia   . Hypertension   . Hypokalemia due to loss of potassium 10/21/2015   Overview:  Associated with 3 weeks of diarrhea  And QT prolongation.  . Hypothyroidism   . IBS (irritable bowel syndrome)   . Moderate episode of recurrent major depressive disorder (HCC) 06/03/2004  . Morbid obesity (HCC)   . MRSA (methicillin resistant Staphylococcus aureus) infection 11/2017   left inner thigh abcess  . Neurogenic bladder    has pacemaker  . Neuropathy   . Obesity   . Panic attacks   . Rheumatoid arthritis (HCC)   . Sleep apnea    STATES SEVERE, CANT TOLERATE MASK- LAST STUDY YEARS AGO    Patient Active Problem List   Diagnosis Date Noted  . Decubitus ulcer of heel, bilateral 02/21/2018  . Diabetic ulcer of toe of right foot associated with type 2 diabetes mellitus (HCC) 02/14/2018  . Ulcer of left heel and midfoot with fat layer exposed (HCC) 02/14/2018  . MRSA (methicillin resistant  Staphylococcus aureus) infection 11/17/2017  . Cellulitis 11/15/2017  . Perineal abscess 11/15/2017  . Subacute vulvitis 11/01/2017  . Chronic cystitis 03/01/2017  . Chronic pain syndrome 03/31/2016  . Insomnia secondary to chronic pain 03/31/2016  . Chronic upper back pain 12/25/2015  . Chronic hand pain (Bilateral) (L>R) 12/25/2015  . Rheumatoid arthritis (HCC) 12/25/2015  . Osteoarthritis, multiple sites 12/24/2015  . Chronic foot pain (Location of Primary Source of Pain) (Bilateral) (L>R) 12/24/2015  . Chronic elbow pain (Location of Tertiary source of pain) (Bilateral) (L>R) 12/24/2015  . Chronic shoulder pain (Bilateral) (L>R) 12/24/2015  . Chronic neck pain (Bilateral) (R>L) 12/24/2015  . 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[employer]) 12/23/2015  . Chronic knee pain (Bilateral) (R>L) 12/23/2015  . Long term current use of opiate analgesic 12/23/2015  . Long term prescription opiate use 12/23/2015  . Opiate use (30 MME/Day) 12/23/2015  . Neurogenic pain 12/23/2015  . Neuropathic pain 12/23/2015  . Diabetic peripheral neuropathy (HCC) 12/23/2015  . Encounter for therapeutic drug level monitoring 12/23/2015  . Encounter for pain management planning 12/23/2015  . GERD (gastroesophageal reflux disease) 11/25/2015  . Neuropathy 11/25/2015  . Hypokalemia 10/21/2015  . Hypomagnesemia 10/21/2015  . QT prolongation 10/21/2015  . 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 Chronic wrist pain (Location of Secondary source of pain) (Bilateral) (L>R) 03/19/2015  .  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 Female genuine stress incontinence 02/14/2015  . Urge incontinence of urine 02/14/2015  . Obstructive apnea 07/03/2014  . Chronic diastolic CHF (congestive heart failure), NYHA class 3 (HCC) 07/03/2014  . Anxiety 01/03/2014  . Diabetic ulcer of heel (HCC) 01/03/2014  . COPD (chronic obstructive pulmonary disease) (HCC) 01/03/2014  . Bipolar disorder,  unspecified (HCC) 01/03/2014  . Diastolic dysfunction 01/03/2014  . Combined fat and carbohydrate induced hyperlipemia 01/03/2014  . Shortness of breath 01/03/2014  . Obesity 01/03/2014  . Acute on chronic congestive heart failure (HCC) 01/03/2014  . Incomplete bladder emptying 11/02/2012  . Bladder retention 10/10/2012  . Detrusor muscle hypertonia 10/04/2012  . Obstruction of urinary tract 10/04/2012  . FOM (frequency of micturition) 10/04/2012  . Mixed incontinence 10/04/2012  . Vitamin D deficiency 04/15/2012  . Borderline personality disorder (HCC) 01/06/2012  . Mixed anxiety depressive disorder 01/06/2012  . History of laparoscopic adjustable gastric banding, 03/20/2007.  Removed 09/19/2011. 08/04/2011  . Hypothyroidism 06/28/2010  . Essential (primary) hypertension 01/27/2005  . Major depressive disorder, recurrent episode, moderate (HCC) 06/03/2004    Past Surgical History:  Procedure Laterality Date  . ABDOMINAL HYSTERECTOMY    . CHOLECYSTECTOMY    . DG GREAT TOE RIGHT FOOT  02/23/2018  . EYE SURGERY     bilateral cataract extraction with IOL  . HERNIA REPAIR     ventral hernia with strangulation  . LAPAROSCOPIC GASTRIC BANDING  03/20/07  . TONSILLECTOMY    . TUBAL LIGATION      OB History   No obstetric history on file.      Home Medications    Prior to Admission medications   Medication Sig Start Date End Date Taking? Authorizing Provider  ARIPiprazole (ABILIFY) 5 MG tablet Take 1 tablet (5 mg total) by mouth daily. 06/01/18  Yes Clapacs, Jackquline Denmark, MD  atorvastatin (LIPITOR) 80 MG tablet Take by mouth. 02/09/13  Yes [provider]  B-D INSULIN SYRINGE 1CC/25G 25G X 5/8" 1 ML MISC 1 UNITS BY MISCELLANEOUS ROUTE EVERY SEVEN (7) DAYS. TO BE USED WITH METHOTREXATE 12/10/16  Yes [provider]  busPIRone (BUSPAR) 10 MG tablet Take 1 tablet (10 mg total) by mouth 2 (two) times daily. 06/01/18  Yes Clapacs, Jackquline Denmark, MD  Calcium Carb-Ergocalciferol  250-125 MG-UNIT TABS Take by mouth.   Yes [provider]  Cholecalciferol (VITAMIN D-1000 MAX ST) 1000 units tablet Take by mouth.   Yes [provider]  clotrimazole-betamethasone (LOTRISONE) cream Apply 1 application topically 2 (two) times daily. For yeast infection under stomach 05/07/11  Yes [provider]  FLUoxetine (PROZAC) 20 MG capsule Take 1 capsule (20 mg total) by mouth daily. 06/01/18  Yes Clapacs, Jackquline Denmark, MD  folic acid (FOLVITE) 1 MG tablet Take 1 mg by mouth daily.    Yes [provider]  hydroxychloroquine (PLAQUENIL) 200 MG tablet Take 200 mg by mouth 2 (two) times daily.   Yes [provider]  hydrOXYzine (ATARAX/VISTARIL) 50 MG tablet Take 50 mg by mouth Twice daily.  07/07/11  Yes [provider]  insulin lispro (HUMALOG) 100 UNIT/ML injection Inject 10u AC TID and add SSI: <150=0u, 151-200=2u, 201-250=4u, 251-300=6u, >300mg /dl=8u. Up to 60u daily. 10/31/17  Yes [provider]  KLOR-CON M20 20 MEQ tablet TAKE ONE TABLET BY MOUTH TWICE DAILY Patient taking differently: take 2 tabs in the a.m. , 1 tab in the evening 04/10/13  Yes Iran Ouch, MD  LANTUS 100 UNIT/ML injection INJECT  0.2 ML (20 UNITS TOTAL) UNDER THE SKIN NIGHTLY. 10/02/17  Yes [provider]  leflunomide (ARAVA) 10 MG tablet Take 10 mg by mouth daily. 04/27/17  Yes [provider]  lisinopril (PRINIVIL,ZESTRIL) 2.5 MG tablet Take 2.5 mg by mouth daily.   Yes [provider]  lovastatin (MEVACOR) 40 MG tablet Take 40 mg by mouth daily.    Yes [provider]  Melatonin 10 MG CAPS Take by mouth. 05/30/16  Yes [provider]  metFORMIN (GLUCOPHAGE) 500 MG tablet Take 500 mg by mouth 2 (two) times daily with a meal.    Yes [provider]  methotrexate 50 MG/2ML injection Inject into the muscle.   Yes [provider]  oxyCODONE (OXY IR/ROXICODONE) 5 MG immediate release tablet Take 1 tablet  (5 mg total) by mouth every 6 (six) hours as needed for severe pain. 06/07/18 07/07/18 Yes Barbette MerinoKing, Crystal M, NP  oxyCODONE-acetaminophen (PERCOCET/ROXICET) 5-325 MG tablet Take by mouth.   Yes [provider]  pregabalin (LYRICA) 150 MG capsule Take 1 capsule (150 mg total) by mouth 3 (three) times daily. 06/01/18 08/30/18 Yes Clapacs, Jackquline DenmarkJohn T, MD  Vitamin D, Ergocalciferol, (DRISDOL) 50000 units CAPS capsule Take by mouth.   Yes [provider]  zolpidem (AMBIEN) 5 MG tablet TAKE 1 TABLET BY MOUTH EVERYDAY AT BEDTIME 06/01/18  Yes Clapacs, Jackquline DenmarkJohn T, MD  cephALEXin (KEFLEX) 500 MG capsule Take 1 capsule (500 mg total) by mouth 2 (two) times daily. 06/27/18   Tommie Samsook, Michayla Mcneil G, DO  Diclofenac Sodium 3 % GEL APPLY 1 APPLICATION TOPICALLY TWO (2) TIMES A DAY. 06/21/17   [provider]  levothyroxine (SYNTHROID, LEVOTHROID) 88 MCG tablet Take 88 mcg by mouth daily before breakfast.    [provider]  loperamide (IMODIUM) 2 MG capsule Take 2 mg by mouth as needed.  06/21/16   [provider]  phenazopyridine (PYRIDIUM) 200 MG tablet Take 1 tablet (200 mg total) by mouth 3 (three) times daily. 05/16/18   Lutricia Feiloemer, William P, PA-C  promethazine (PHENERGAN) 25 MG tablet TAKE 1 TABLET BY MOUTH EVERY 6 HOURS AS NEEDED NAUSEA OR VOMITING 10/18/17   [provider]  QUEtiapine (SEROQUEL) 300 MG tablet TAKE 1 TABLET BY MOUTH EVERYDAY AT BEDTIME 06/01/18   Clapacs, Jackquline DenmarkJohn T, MD    Family History Family History  Problem Relation Age of Onset  . Heart failure Father   . Bipolar disorder Father   . Alcohol abuse Father   . Anxiety disorder Father   . Depression Father   . Heart disease Brother   . Heart attack Brother [phone]       MI s/p stents placed  . Anxiety disorder Sister   . Depression Sister   . Anxiety disorder Sister   . Depression Sister   . Bipolar disorder Sister   . Alcohol abuse Sister   . Drug abuse Sister   . Heart attack Brother     Social History Social  History   Tobacco Use  . Smoking status: Former Smoker    Packs/day: 2.00    Years: 27.00    Pack years: 54.00    Types: Cigarettes    Last attempt to quit: 07/30/1999    Years since quitting: 18.9  . Smokeless tobacco: Never Used  Substance Use Topics  . Alcohol use: No  . Drug use: No     Allergies   Codeine; Propoxyphene; Sulfa antibiotics; and Hydrocodone   Review of Systems Review of Systems  Constitutional: Positive for chills. Negative for fever.  Genitourinary: Positive for dysuria, frequency and urgency.   Physical Exam Triage Vital Signs ED Triage Vitals  Enc Vitals Group     BP 06/27/18 1125 110/74     Pulse Rate 06/27/18 1125 97     Resp 06/27/18 1125 16     Temp 06/27/18 1125 97.7 F (36.5 C)     Temp Source 06/27/18 1125 Oral     SpO2 06/27/18 1125 98 %     Weight 06/27/18 1120 (!) 319 lb (144.7 kg)     Height 06/27/18 1120  (1.702 m)     Head Circumference --      Peak Flow --      Pain Score 06/27/18 1120 9     Pain Loc --      Pain Edu? --      Excl. in GC? --    Updated Vital Signs BP 110/74 (BP Location: Right Arm)   Pulse 97   Temp 97.7 F (36.5 C) (Oral)   Resp 16   Ht  (1.702 m)   Wt (!) 144.7 kg   LMP 04/20/2001   SpO2 98%   BMI 49.96 kg/m   Visual Acuity Right Eye Distance:   Left Eye Distance:   Bilateral Distance:    Right Eye Near:   Left Eye Near:    Bilateral Near:     Physical Exam Vitals signs and nursing note reviewed.  Constitutional:      General: She is not in acute distress.    Appearance: Normal appearance. She is obese.  HENT:     Head: Normocephalic and atraumatic.     Nose: Nose normal.  Eyes:     General:        Right eye: No discharge.        Left eye: No discharge.     Conjunctiva/sclera: Conjunctivae normal.  Cardiovascular:     Rate and Rhythm: Normal rate and regular rhythm.  Pulmonary:     Effort: Pulmonary effort is normal.     Breath sounds: Normal breath sounds.  Abdominal:      General: There is no distension.     Palpations: Abdomen is soft.     Tenderness: There is no abdominal tenderness.  Neurological:     Mental Status: She is alert.  Psychiatric:        Mood and Affect: Mood normal.        Behavior: Behavior normal.    UC Treatments / Results  Labs (all labs ordered are listed, but only abnormal results are displayed) Labs Reviewed  URINALYSIS, COMPLETE (UACMP) WITH MICROSCOPIC - Abnormal; Notable for the following components:      Result Value   APPearance CLOUDY (*)    Glucose, UA >1000 (*)    Hgb urine dipstick TRACE (*)    Nitrite POSITIVE (*)    Leukocytes,Ua SMALL (*)    Bacteria, UA FEW (*)    All other components within normal limits  URINE CULTURE    EKG None  Radiology No results found.  Procedures Procedures (including critical care time)  Medications Ordered in UC Medications - No data to display  Initial Impression / Assessment and Plan / UC Course  I have reviewed the triage vital signs and the nursing notes.  Pertinent labs & imaging results that were available during my care of the patient were reviewed by me and considered in my medical decision making (see chart for  details).    58 year old female presents with UTI. Sending Culture. Starting on keflex.  Final Clinical Impressions(s) / UC Diagnoses   Final diagnoses:  Acute cystitis without hematuria   Discharge Instructions   None    ED Prescriptions    Medication Sig Dispense Auth. Provider   cephALEXin (KEFLEX) 500 MG capsule Take 1 capsule (500 mg total) by mouth 2 (two) times daily. 14 capsule Tommie Sams, DO     Controlled Substance Prescriptions [place] Controlled Substance Registry consulted? Not Applicable   Tommie Sams, DO 06/27/18 1215

## 2018-06-27 NOTE — ED Triage Notes (Signed)
Patient c/o burning when urinating and increase in urinary frequency that started yesterday.   

## 2018-06-28 ENCOUNTER — Ambulatory Visit: Admit: 2018-06-28 | Discharge: 2018-07-04 | Disposition: A | Discharge status: Home Health | Payer: MEDICAID

## 2018-06-28 DIAGNOSIS — Z9884 Bariatric surgery status: Principal | ICD-10-CM

## 2018-06-28 DIAGNOSIS — L02416 Cutaneous abscess of left lower limb: Principal | ICD-10-CM

## 2018-06-28 DIAGNOSIS — K219 Gastro-esophageal reflux disease without esophagitis: Principal | ICD-10-CM

## 2018-06-28 DIAGNOSIS — N3946 Mixed incontinence: Principal | ICD-10-CM

## 2018-06-28 DIAGNOSIS — E039 Hypothyroidism, unspecified: Principal | ICD-10-CM

## 2018-06-28 DIAGNOSIS — E1142 Type 2 diabetes mellitus with diabetic polyneuropathy: Principal | ICD-10-CM

## 2018-06-28 DIAGNOSIS — E1140 Type 2 diabetes mellitus with diabetic neuropathy, unspecified: Principal | ICD-10-CM

## 2018-06-28 DIAGNOSIS — Z6841 Body Mass Index (BMI) 40.0 and over, adult: Principal | ICD-10-CM

## 2018-06-28 DIAGNOSIS — I110 Hypertensive heart disease with heart failure: Principal | ICD-10-CM

## 2018-06-28 DIAGNOSIS — F332 Major depressive disorder, recurrent severe without psychotic features: Principal | ICD-10-CM

## 2018-06-28 DIAGNOSIS — Z882 Allergy status to sulfonamides status: Principal | ICD-10-CM

## 2018-06-28 DIAGNOSIS — Z794 Long term (current) use of insulin: Principal | ICD-10-CM

## 2018-06-28 DIAGNOSIS — N318 Other neuromuscular dysfunction of bladder: Principal | ICD-10-CM

## 2018-06-28 DIAGNOSIS — N139 Obstructive and reflux uropathy, unspecified: Principal | ICD-10-CM

## 2018-06-28 DIAGNOSIS — G8929 Other chronic pain: Principal | ICD-10-CM

## 2018-06-28 DIAGNOSIS — Z7952 Long term (current) use of systemic steroids: Principal | ICD-10-CM

## 2018-06-28 DIAGNOSIS — E1165 Type 2 diabetes mellitus with hyperglycemia: Principal | ICD-10-CM

## 2018-06-28 DIAGNOSIS — G4733 Obstructive sleep apnea (adult) (pediatric): Principal | ICD-10-CM

## 2018-06-28 DIAGNOSIS — Z791 Long term (current) use of non-steroidal anti-inflammatories (NSAID): Principal | ICD-10-CM

## 2018-06-28 DIAGNOSIS — IMO0002 DDD (degenerative disc disease): Principal | ICD-10-CM

## 2018-06-28 DIAGNOSIS — J449 Chronic obstructive pulmonary disease, unspecified: Principal | ICD-10-CM

## 2018-06-28 DIAGNOSIS — E11621 Type 2 diabetes mellitus with foot ulcer: Principal | ICD-10-CM

## 2018-06-28 DIAGNOSIS — I5032 Chronic diastolic (congestive) heart failure: Principal | ICD-10-CM

## 2018-06-28 DIAGNOSIS — Z9049 Acquired absence of other specified parts of digestive tract: Principal | ICD-10-CM

## 2018-06-28 DIAGNOSIS — Z87891 Personal history of nicotine dependence: Principal | ICD-10-CM

## 2018-06-28 DIAGNOSIS — Z9181 History of falling: Principal | ICD-10-CM

## 2018-06-28 DIAGNOSIS — F319 Bipolar disorder, unspecified: Principal | ICD-10-CM

## 2018-06-28 DIAGNOSIS — M069 Rheumatoid arthritis, unspecified: Principal | ICD-10-CM

## 2018-06-28 DIAGNOSIS — E785 Hyperlipidemia, unspecified: Principal | ICD-10-CM

## 2018-06-28 DIAGNOSIS — Z8614 Personal history of Methicillin resistant Staphylococcus aureus infection: Principal | ICD-10-CM

## 2018-06-28 DIAGNOSIS — L97511 Non-pressure chronic ulcer of other part of right foot limited to breakdown of skin: Principal | ICD-10-CM

## 2018-06-28 DIAGNOSIS — E11628 Type 2 diabetes mellitus with other skin complications: Principal | ICD-10-CM

## 2018-06-28 DIAGNOSIS — Z89411 Acquired absence of right great toe: Principal | ICD-10-CM

## 2018-06-28 DIAGNOSIS — I509 Heart failure, unspecified: Principal | ICD-10-CM

## 2018-06-28 DIAGNOSIS — F603 Borderline personality disorder: Principal | ICD-10-CM

## 2018-06-28 DIAGNOSIS — F419 Anxiety disorder, unspecified: Principal | ICD-10-CM

## 2018-06-28 DIAGNOSIS — M06079 Rheumatoid arthritis without rheumatoid factor, unspecified ankle and foot: Principal | ICD-10-CM

## 2018-06-28 DIAGNOSIS — N390 Urinary tract infection, site not specified: Principal | ICD-10-CM

## 2018-06-28 DIAGNOSIS — G4730 Sleep apnea, unspecified: Principal | ICD-10-CM

## 2018-06-28 DIAGNOSIS — Z8739 Personal history of other diseases of the musculoskeletal system and connective tissue: Principal | ICD-10-CM

## 2018-06-28 DIAGNOSIS — M519 Unspecified thoracic, thoracolumbar and lumbosacral intervertebral disc disorder: Principal | ICD-10-CM

## 2018-06-28 DIAGNOSIS — G894 Chronic pain syndrome: Principal | ICD-10-CM

## 2018-06-28 DIAGNOSIS — Z8631 Personal history of diabetic foot ulcer: Principal | ICD-10-CM

## 2018-06-28 DIAGNOSIS — L03031 Cellulitis of right toe: Principal | ICD-10-CM

## 2018-06-28 DIAGNOSIS — M0579 Rheumatoid arthritis with rheumatoid factor of multiple sites without organ or systems involvement: Principal | ICD-10-CM

## 2018-06-28 DIAGNOSIS — L03115 Cellulitis of right lower limb: Principal | ICD-10-CM

## 2018-06-28 DIAGNOSIS — R350 Frequency of micturition: Principal | ICD-10-CM

## 2018-06-28 DIAGNOSIS — E1169 Type 2 diabetes mellitus with other specified complication: Principal | ICD-10-CM

## 2018-06-28 DIAGNOSIS — F5105 Insomnia due to other mental disorder: Principal | ICD-10-CM

## 2018-06-28 DIAGNOSIS — Z48817 Encounter for surgical aftercare following surgery on the skin and subcutaneous tissue: Principal | ICD-10-CM

## 2018-06-28 DIAGNOSIS — R928 Other abnormal and inconclusive findings on diagnostic imaging of breast: Principal | ICD-10-CM

## 2018-06-28 DIAGNOSIS — L97519 Non-pressure chronic ulcer of other part of right foot with unspecified severity: Principal | ICD-10-CM

## 2018-06-28 DIAGNOSIS — M1990 Unspecified osteoarthritis, unspecified site: Principal | ICD-10-CM

## 2018-06-28 DIAGNOSIS — E669 Obesity, unspecified: Principal | ICD-10-CM

## 2018-06-29 DIAGNOSIS — F419 Anxiety disorder, unspecified: Principal | ICD-10-CM

## 2018-06-29 DIAGNOSIS — M1990 Unspecified osteoarthritis, unspecified site: Principal | ICD-10-CM

## 2018-06-29 DIAGNOSIS — R350 Frequency of micturition: Principal | ICD-10-CM

## 2018-06-29 DIAGNOSIS — E1169 Type 2 diabetes mellitus with other specified complication: Principal | ICD-10-CM

## 2018-06-29 DIAGNOSIS — G4730 Sleep apnea, unspecified: Principal | ICD-10-CM

## 2018-06-29 DIAGNOSIS — E039 Hypothyroidism, unspecified: Principal | ICD-10-CM

## 2018-06-29 DIAGNOSIS — N3946 Mixed incontinence: Principal | ICD-10-CM

## 2018-06-29 DIAGNOSIS — I509 Heart failure, unspecified: Principal | ICD-10-CM

## 2018-06-29 DIAGNOSIS — E669 Obesity, unspecified: Principal | ICD-10-CM

## 2018-06-29 DIAGNOSIS — N139 Obstructive and reflux uropathy, unspecified: Principal | ICD-10-CM

## 2018-06-29 DIAGNOSIS — M069 Rheumatoid arthritis, unspecified: Principal | ICD-10-CM

## 2018-06-29 DIAGNOSIS — N318 Other neuromuscular dysfunction of bladder: Principal | ICD-10-CM

## 2018-06-29 DIAGNOSIS — R928 Other abnormal and inconclusive findings on diagnostic imaging of breast: Principal | ICD-10-CM

## 2018-06-29 DIAGNOSIS — IMO0002 DDD (degenerative disc disease): Principal | ICD-10-CM

## 2018-06-30 DIAGNOSIS — J449 Chronic obstructive pulmonary disease, unspecified: Principal | ICD-10-CM

## 2018-06-30 DIAGNOSIS — M0579 Rheumatoid arthritis with rheumatoid factor of multiple sites without organ or systems involvement: Principal | ICD-10-CM

## 2018-06-30 DIAGNOSIS — M519 Unspecified thoracic, thoracolumbar and lumbosacral intervertebral disc disorder: Principal | ICD-10-CM

## 2018-06-30 DIAGNOSIS — G4733 Obstructive sleep apnea (adult) (pediatric): Principal | ICD-10-CM

## 2018-06-30 DIAGNOSIS — F319 Bipolar disorder, unspecified: Principal | ICD-10-CM

## 2018-06-30 DIAGNOSIS — Z794 Long term (current) use of insulin: Principal | ICD-10-CM

## 2018-06-30 DIAGNOSIS — F603 Borderline personality disorder: Principal | ICD-10-CM

## 2018-06-30 DIAGNOSIS — Z8614 Personal history of Methicillin resistant Staphylococcus aureus infection: Principal | ICD-10-CM

## 2018-06-30 DIAGNOSIS — E039 Hypothyroidism, unspecified: Principal | ICD-10-CM

## 2018-06-30 DIAGNOSIS — G894 Chronic pain syndrome: Principal | ICD-10-CM

## 2018-06-30 DIAGNOSIS — I110 Hypertensive heart disease with heart failure: Principal | ICD-10-CM

## 2018-06-30 DIAGNOSIS — N318 Other neuromuscular dysfunction of bladder: Principal | ICD-10-CM

## 2018-06-30 DIAGNOSIS — Z87891 Personal history of nicotine dependence: Principal | ICD-10-CM

## 2018-06-30 DIAGNOSIS — F419 Anxiety disorder, unspecified: Principal | ICD-10-CM

## 2018-06-30 DIAGNOSIS — Z9181 History of falling: Principal | ICD-10-CM

## 2018-06-30 DIAGNOSIS — L02416 Cutaneous abscess of left lower limb: Principal | ICD-10-CM

## 2018-06-30 DIAGNOSIS — K219 Gastro-esophageal reflux disease without esophagitis: Principal | ICD-10-CM

## 2018-06-30 DIAGNOSIS — E1165 Type 2 diabetes mellitus with hyperglycemia: Principal | ICD-10-CM

## 2018-06-30 DIAGNOSIS — I5032 Chronic diastolic (congestive) heart failure: Principal | ICD-10-CM

## 2018-06-30 DIAGNOSIS — F5105 Insomnia due to other mental disorder: Principal | ICD-10-CM

## 2018-06-30 DIAGNOSIS — E1142 Type 2 diabetes mellitus with diabetic polyneuropathy: Principal | ICD-10-CM

## 2018-06-30 DIAGNOSIS — Z8631 Personal history of diabetic foot ulcer: Principal | ICD-10-CM

## 2018-06-30 DIAGNOSIS — Z6841 Body Mass Index (BMI) 40.0 and over, adult: Principal | ICD-10-CM

## 2018-06-30 LAB — URINE CULTURE: Culture: >=100000 — AB

## 2018-07-03 ENCOUNTER — Telehealth (HOSPITAL_COMMUNITY): Payer: Self-pay | Admitting: Emergency Medicine

## 2018-07-03 NOTE — Telephone Encounter (Signed)
Urine culture was positive for e coli and was given keflex at urgent care visit. Pt contacted and made aware, educated on completing antibiotic and to follow up if symptoms are persistent. Verbalized understanding.    

## 2018-07-04 MED ORDER — LEVOFLOXACIN 500 MG TABLET: 500 mg | tablet | Freq: Every day | 0 refills | 0 days

## 2018-07-04 MED ORDER — DOXYCYCLINE HYCLATE 100 MG CAPSULE
ORAL_CAPSULE | Freq: Two times a day (BID) | ORAL | 0 refills | 0.00000 days | Status: CP
Start: 2018-07-04 — End: 2018-08-03

## 2018-07-04 MED ORDER — INSULIN GLARGINE (U-100) 100 UNIT/ML SUBCUTANEOUS SOLUTION
Freq: Every day | SUBCUTANEOUS | 2 refills | 0.00000 days
Start: 2018-07-04 — End: 2018-08-09

## 2018-07-05 DIAGNOSIS — M519 Unspecified thoracic, thoracolumbar and lumbosacral intervertebral disc disorder: Principal | ICD-10-CM

## 2018-07-05 DIAGNOSIS — I5032 Chronic diastolic (congestive) heart failure: Principal | ICD-10-CM

## 2018-07-05 DIAGNOSIS — F603 Borderline personality disorder: Principal | ICD-10-CM

## 2018-07-05 DIAGNOSIS — Z8631 Personal history of diabetic foot ulcer: Principal | ICD-10-CM

## 2018-07-05 DIAGNOSIS — F319 Bipolar disorder, unspecified: Principal | ICD-10-CM

## 2018-07-05 DIAGNOSIS — Z87891 Personal history of nicotine dependence: Principal | ICD-10-CM

## 2018-07-05 DIAGNOSIS — E1165 Type 2 diabetes mellitus with hyperglycemia: Principal | ICD-10-CM

## 2018-07-05 DIAGNOSIS — Z8614 Personal history of Methicillin resistant Staphylococcus aureus infection: Principal | ICD-10-CM

## 2018-07-05 DIAGNOSIS — N318 Other neuromuscular dysfunction of bladder: Principal | ICD-10-CM

## 2018-07-05 DIAGNOSIS — Z6841 Body Mass Index (BMI) 40.0 and over, adult: Principal | ICD-10-CM

## 2018-07-05 DIAGNOSIS — Z9181 History of falling: Principal | ICD-10-CM

## 2018-07-05 DIAGNOSIS — M0579 Rheumatoid arthritis with rheumatoid factor of multiple sites without organ or systems involvement: Principal | ICD-10-CM

## 2018-07-05 DIAGNOSIS — K219 Gastro-esophageal reflux disease without esophagitis: Principal | ICD-10-CM

## 2018-07-05 DIAGNOSIS — I110 Hypertensive heart disease with heart failure: Principal | ICD-10-CM

## 2018-07-05 DIAGNOSIS — E1142 Type 2 diabetes mellitus with diabetic polyneuropathy: Principal | ICD-10-CM

## 2018-07-05 DIAGNOSIS — F419 Anxiety disorder, unspecified: Principal | ICD-10-CM

## 2018-07-05 DIAGNOSIS — E039 Hypothyroidism, unspecified: Principal | ICD-10-CM

## 2018-07-05 DIAGNOSIS — G894 Chronic pain syndrome: Principal | ICD-10-CM

## 2018-07-05 DIAGNOSIS — Z794 Long term (current) use of insulin: Principal | ICD-10-CM

## 2018-07-05 DIAGNOSIS — L02416 Cutaneous abscess of left lower limb: Principal | ICD-10-CM

## 2018-07-05 DIAGNOSIS — G4733 Obstructive sleep apnea (adult) (pediatric): Principal | ICD-10-CM

## 2018-07-05 DIAGNOSIS — F5105 Insomnia due to other mental disorder: Principal | ICD-10-CM

## 2018-07-05 DIAGNOSIS — J449 Chronic obstructive pulmonary disease, unspecified: Principal | ICD-10-CM

## 2018-07-05 MED ORDER — LEVOFLOXACIN 500 MG TABLET
ORAL_TABLET | Freq: Every day | ORAL | 0 refills | 0.00000 days | Status: CP
Start: 2018-07-05 — End: 2018-08-03

## 2018-07-07 DIAGNOSIS — M519 Unspecified thoracic, thoracolumbar and lumbosacral intervertebral disc disorder: Principal | ICD-10-CM

## 2018-07-07 DIAGNOSIS — F419 Anxiety disorder, unspecified: Principal | ICD-10-CM

## 2018-07-07 DIAGNOSIS — Z8631 Personal history of diabetic foot ulcer: Principal | ICD-10-CM

## 2018-07-07 DIAGNOSIS — F5105 Insomnia due to other mental disorder: Principal | ICD-10-CM

## 2018-07-07 DIAGNOSIS — E1142 Type 2 diabetes mellitus with diabetic polyneuropathy: Principal | ICD-10-CM

## 2018-07-07 DIAGNOSIS — Z794 Long term (current) use of insulin: Principal | ICD-10-CM

## 2018-07-07 DIAGNOSIS — L02416 Cutaneous abscess of left lower limb: Principal | ICD-10-CM

## 2018-07-07 DIAGNOSIS — Z8614 Personal history of Methicillin resistant Staphylococcus aureus infection: Principal | ICD-10-CM

## 2018-07-07 DIAGNOSIS — N318 Other neuromuscular dysfunction of bladder: Principal | ICD-10-CM

## 2018-07-07 DIAGNOSIS — I5032 Chronic diastolic (congestive) heart failure: Principal | ICD-10-CM

## 2018-07-07 DIAGNOSIS — Z9181 History of falling: Principal | ICD-10-CM

## 2018-07-07 DIAGNOSIS — E1165 Type 2 diabetes mellitus with hyperglycemia: Principal | ICD-10-CM

## 2018-07-07 DIAGNOSIS — J449 Chronic obstructive pulmonary disease, unspecified: Principal | ICD-10-CM

## 2018-07-07 DIAGNOSIS — M0579 Rheumatoid arthritis with rheumatoid factor of multiple sites without organ or systems involvement: Principal | ICD-10-CM

## 2018-07-07 DIAGNOSIS — Z6841 Body Mass Index (BMI) 40.0 and over, adult: Principal | ICD-10-CM

## 2018-07-07 DIAGNOSIS — I110 Hypertensive heart disease with heart failure: Principal | ICD-10-CM

## 2018-07-07 DIAGNOSIS — G894 Chronic pain syndrome: Principal | ICD-10-CM

## 2018-07-07 DIAGNOSIS — E039 Hypothyroidism, unspecified: Principal | ICD-10-CM

## 2018-07-07 DIAGNOSIS — F319 Bipolar disorder, unspecified: Principal | ICD-10-CM

## 2018-07-07 DIAGNOSIS — F603 Borderline personality disorder: Principal | ICD-10-CM

## 2018-07-07 DIAGNOSIS — G4733 Obstructive sleep apnea (adult) (pediatric): Principal | ICD-10-CM

## 2018-07-07 DIAGNOSIS — Z87891 Personal history of nicotine dependence: Principal | ICD-10-CM

## 2018-07-07 DIAGNOSIS — K219 Gastro-esophageal reflux disease without esophagitis: Principal | ICD-10-CM

## 2018-07-10 DIAGNOSIS — F5105 Insomnia due to other mental disorder: Principal | ICD-10-CM

## 2018-07-10 DIAGNOSIS — Z9181 History of falling: Principal | ICD-10-CM

## 2018-07-10 DIAGNOSIS — G894 Chronic pain syndrome: Principal | ICD-10-CM

## 2018-07-10 DIAGNOSIS — I110 Hypertensive heart disease with heart failure: Principal | ICD-10-CM

## 2018-07-10 DIAGNOSIS — N318 Other neuromuscular dysfunction of bladder: Principal | ICD-10-CM

## 2018-07-10 DIAGNOSIS — Z8631 Personal history of diabetic foot ulcer: Principal | ICD-10-CM

## 2018-07-10 DIAGNOSIS — I5032 Chronic diastolic (congestive) heart failure: Principal | ICD-10-CM

## 2018-07-10 DIAGNOSIS — M519 Unspecified thoracic, thoracolumbar and lumbosacral intervertebral disc disorder: Principal | ICD-10-CM

## 2018-07-10 DIAGNOSIS — E1165 Type 2 diabetes mellitus with hyperglycemia: Principal | ICD-10-CM

## 2018-07-10 DIAGNOSIS — Z8614 Personal history of Methicillin resistant Staphylococcus aureus infection: Principal | ICD-10-CM

## 2018-07-10 DIAGNOSIS — M0579 Rheumatoid arthritis with rheumatoid factor of multiple sites without organ or systems involvement: Principal | ICD-10-CM

## 2018-07-10 DIAGNOSIS — E039 Hypothyroidism, unspecified: Principal | ICD-10-CM

## 2018-07-10 DIAGNOSIS — F419 Anxiety disorder, unspecified: Principal | ICD-10-CM

## 2018-07-10 DIAGNOSIS — E1142 Type 2 diabetes mellitus with diabetic polyneuropathy: Principal | ICD-10-CM

## 2018-07-10 DIAGNOSIS — Z87891 Personal history of nicotine dependence: Principal | ICD-10-CM

## 2018-07-10 DIAGNOSIS — G4733 Obstructive sleep apnea (adult) (pediatric): Principal | ICD-10-CM

## 2018-07-10 DIAGNOSIS — Z794 Long term (current) use of insulin: Principal | ICD-10-CM

## 2018-07-10 DIAGNOSIS — K219 Gastro-esophageal reflux disease without esophagitis: Principal | ICD-10-CM

## 2018-07-10 DIAGNOSIS — L02416 Cutaneous abscess of left lower limb: Principal | ICD-10-CM

## 2018-07-10 DIAGNOSIS — F603 Borderline personality disorder: Principal | ICD-10-CM

## 2018-07-10 DIAGNOSIS — F319 Bipolar disorder, unspecified: Principal | ICD-10-CM

## 2018-07-10 DIAGNOSIS — Z6841 Body Mass Index (BMI) 40.0 and over, adult: Principal | ICD-10-CM

## 2018-07-10 DIAGNOSIS — J449 Chronic obstructive pulmonary disease, unspecified: Principal | ICD-10-CM

## 2018-07-11 ENCOUNTER — Ambulatory Visit: Admit: 2018-07-11 | Discharge: 2018-07-12 | Payer: MEDICAID

## 2018-07-11 DIAGNOSIS — N318 Other neuromuscular dysfunction of bladder: Principal | ICD-10-CM

## 2018-07-11 DIAGNOSIS — M1990 Unspecified osteoarthritis, unspecified site: Principal | ICD-10-CM

## 2018-07-11 DIAGNOSIS — M869 Osteomyelitis, unspecified: Principal | ICD-10-CM

## 2018-07-11 DIAGNOSIS — E039 Hypothyroidism, unspecified: Principal | ICD-10-CM

## 2018-07-11 DIAGNOSIS — G4730 Sleep apnea, unspecified: Principal | ICD-10-CM

## 2018-07-11 DIAGNOSIS — N139 Obstructive and reflux uropathy, unspecified: Principal | ICD-10-CM

## 2018-07-11 DIAGNOSIS — F419 Anxiety disorder, unspecified: Principal | ICD-10-CM

## 2018-07-11 DIAGNOSIS — R928 Other abnormal and inconclusive findings on diagnostic imaging of breast: Principal | ICD-10-CM

## 2018-07-11 DIAGNOSIS — M069 Rheumatoid arthritis, unspecified: Principal | ICD-10-CM

## 2018-07-11 DIAGNOSIS — R350 Frequency of micturition: Principal | ICD-10-CM

## 2018-07-11 DIAGNOSIS — N3946 Mixed incontinence: Principal | ICD-10-CM

## 2018-07-11 DIAGNOSIS — E1169 Type 2 diabetes mellitus with other specified complication: Principal | ICD-10-CM

## 2018-07-11 DIAGNOSIS — E669 Obesity, unspecified: Principal | ICD-10-CM

## 2018-07-11 DIAGNOSIS — I509 Heart failure, unspecified: Principal | ICD-10-CM

## 2018-07-11 DIAGNOSIS — IMO0002 DDD (degenerative disc disease): Principal | ICD-10-CM

## 2018-07-11 MED ORDER — DOXYCYCLINE HYCLATE 100 MG CAPSULE
ORAL_CAPSULE | Freq: Two times a day (BID) | ORAL | 3 refills | 0 days | Status: CP
Start: 2018-07-11 — End: 2018-08-01

## 2018-07-12 DIAGNOSIS — Z794 Long term (current) use of insulin: Principal | ICD-10-CM

## 2018-07-12 DIAGNOSIS — K219 Gastro-esophageal reflux disease without esophagitis: Principal | ICD-10-CM

## 2018-07-12 DIAGNOSIS — I5032 Chronic diastolic (congestive) heart failure: Principal | ICD-10-CM

## 2018-07-12 DIAGNOSIS — Z6841 Body Mass Index (BMI) 40.0 and over, adult: Principal | ICD-10-CM

## 2018-07-12 DIAGNOSIS — Z87891 Personal history of nicotine dependence: Principal | ICD-10-CM

## 2018-07-12 DIAGNOSIS — F419 Anxiety disorder, unspecified: Principal | ICD-10-CM

## 2018-07-12 DIAGNOSIS — Z8631 Personal history of diabetic foot ulcer: Principal | ICD-10-CM

## 2018-07-12 DIAGNOSIS — G4733 Obstructive sleep apnea (adult) (pediatric): Principal | ICD-10-CM

## 2018-07-12 DIAGNOSIS — M519 Unspecified thoracic, thoracolumbar and lumbosacral intervertebral disc disorder: Principal | ICD-10-CM

## 2018-07-12 DIAGNOSIS — F5105 Insomnia due to other mental disorder: Principal | ICD-10-CM

## 2018-07-12 DIAGNOSIS — M0579 Rheumatoid arthritis with rheumatoid factor of multiple sites without organ or systems involvement: Principal | ICD-10-CM

## 2018-07-12 DIAGNOSIS — I110 Hypertensive heart disease with heart failure: Principal | ICD-10-CM

## 2018-07-12 DIAGNOSIS — Z9181 History of falling: Principal | ICD-10-CM

## 2018-07-12 DIAGNOSIS — E039 Hypothyroidism, unspecified: Principal | ICD-10-CM

## 2018-07-12 DIAGNOSIS — E1142 Type 2 diabetes mellitus with diabetic polyneuropathy: Principal | ICD-10-CM

## 2018-07-12 DIAGNOSIS — E1165 Type 2 diabetes mellitus with hyperglycemia: Principal | ICD-10-CM

## 2018-07-12 DIAGNOSIS — F319 Bipolar disorder, unspecified: Principal | ICD-10-CM

## 2018-07-12 DIAGNOSIS — F603 Borderline personality disorder: Principal | ICD-10-CM

## 2018-07-12 DIAGNOSIS — Z8614 Personal history of Methicillin resistant Staphylococcus aureus infection: Principal | ICD-10-CM

## 2018-07-12 DIAGNOSIS — N318 Other neuromuscular dysfunction of bladder: Principal | ICD-10-CM

## 2018-07-12 DIAGNOSIS — G894 Chronic pain syndrome: Principal | ICD-10-CM

## 2018-07-12 DIAGNOSIS — J449 Chronic obstructive pulmonary disease, unspecified: Principal | ICD-10-CM

## 2018-07-12 DIAGNOSIS — L02416 Cutaneous abscess of left lower limb: Principal | ICD-10-CM

## 2018-07-14 DIAGNOSIS — L02416 Cutaneous abscess of left lower limb: Principal | ICD-10-CM

## 2018-07-14 DIAGNOSIS — N318 Other neuromuscular dysfunction of bladder: Principal | ICD-10-CM

## 2018-07-14 DIAGNOSIS — Z9181 History of falling: Principal | ICD-10-CM

## 2018-07-14 DIAGNOSIS — E1165 Type 2 diabetes mellitus with hyperglycemia: Principal | ICD-10-CM

## 2018-07-14 DIAGNOSIS — E1142 Type 2 diabetes mellitus with diabetic polyneuropathy: Principal | ICD-10-CM

## 2018-07-14 DIAGNOSIS — J449 Chronic obstructive pulmonary disease, unspecified: Principal | ICD-10-CM

## 2018-07-14 DIAGNOSIS — F319 Bipolar disorder, unspecified: Principal | ICD-10-CM

## 2018-07-14 DIAGNOSIS — M0579 Rheumatoid arthritis with rheumatoid factor of multiple sites without organ or systems involvement: Principal | ICD-10-CM

## 2018-07-14 DIAGNOSIS — Z8631 Personal history of diabetic foot ulcer: Principal | ICD-10-CM

## 2018-07-14 DIAGNOSIS — E039 Hypothyroidism, unspecified: Principal | ICD-10-CM

## 2018-07-14 DIAGNOSIS — Z6841 Body Mass Index (BMI) 40.0 and over, adult: Principal | ICD-10-CM

## 2018-07-14 DIAGNOSIS — I110 Hypertensive heart disease with heart failure: Principal | ICD-10-CM

## 2018-07-14 DIAGNOSIS — G894 Chronic pain syndrome: Principal | ICD-10-CM

## 2018-07-14 DIAGNOSIS — Z87891 Personal history of nicotine dependence: Principal | ICD-10-CM

## 2018-07-14 DIAGNOSIS — M519 Unspecified thoracic, thoracolumbar and lumbosacral intervertebral disc disorder: Principal | ICD-10-CM

## 2018-07-14 DIAGNOSIS — G4733 Obstructive sleep apnea (adult) (pediatric): Principal | ICD-10-CM

## 2018-07-14 DIAGNOSIS — Z794 Long term (current) use of insulin: Principal | ICD-10-CM

## 2018-07-14 DIAGNOSIS — F419 Anxiety disorder, unspecified: Principal | ICD-10-CM

## 2018-07-14 DIAGNOSIS — F603 Borderline personality disorder: Principal | ICD-10-CM

## 2018-07-14 DIAGNOSIS — Z8614 Personal history of Methicillin resistant Staphylococcus aureus infection: Principal | ICD-10-CM

## 2018-07-14 DIAGNOSIS — F5105 Insomnia due to other mental disorder: Principal | ICD-10-CM

## 2018-07-14 DIAGNOSIS — I5032 Chronic diastolic (congestive) heart failure: Principal | ICD-10-CM

## 2018-07-14 DIAGNOSIS — K219 Gastro-esophageal reflux disease without esophagitis: Principal | ICD-10-CM

## 2018-07-17 ENCOUNTER — Encounter: Admit: 2018-07-17 | Discharge: 2018-08-15 | Payer: MEDICAID

## 2018-07-17 DIAGNOSIS — F603 Borderline personality disorder: Principal | ICD-10-CM

## 2018-07-17 DIAGNOSIS — L03115 Cellulitis of right lower limb: Principal | ICD-10-CM

## 2018-07-17 DIAGNOSIS — I5032 Chronic diastolic (congestive) heart failure: Principal | ICD-10-CM

## 2018-07-17 DIAGNOSIS — I110 Hypertensive heart disease with heart failure: Principal | ICD-10-CM

## 2018-07-17 DIAGNOSIS — F5105 Insomnia due to other mental disorder: Principal | ICD-10-CM

## 2018-07-17 DIAGNOSIS — Z794 Long term (current) use of insulin: Principal | ICD-10-CM

## 2018-07-17 DIAGNOSIS — L97511 Non-pressure chronic ulcer of other part of right foot limited to breakdown of skin: Principal | ICD-10-CM

## 2018-07-17 DIAGNOSIS — E1142 Type 2 diabetes mellitus with diabetic polyneuropathy: Principal | ICD-10-CM

## 2018-07-17 DIAGNOSIS — Z87891 Personal history of nicotine dependence: Principal | ICD-10-CM

## 2018-07-17 DIAGNOSIS — E039 Hypothyroidism, unspecified: Principal | ICD-10-CM

## 2018-07-17 DIAGNOSIS — E11621 Type 2 diabetes mellitus with foot ulcer: Principal | ICD-10-CM

## 2018-07-17 DIAGNOSIS — F319 Bipolar disorder, unspecified: Principal | ICD-10-CM

## 2018-07-17 DIAGNOSIS — M0579 Rheumatoid arthritis with rheumatoid factor of multiple sites without organ or systems involvement: Principal | ICD-10-CM

## 2018-07-17 DIAGNOSIS — N318 Other neuromuscular dysfunction of bladder: Principal | ICD-10-CM

## 2018-07-17 DIAGNOSIS — G4733 Obstructive sleep apnea (adult) (pediatric): Principal | ICD-10-CM

## 2018-07-17 DIAGNOSIS — J449 Chronic obstructive pulmonary disease, unspecified: Principal | ICD-10-CM

## 2018-07-17 DIAGNOSIS — K219 Gastro-esophageal reflux disease without esophagitis: Principal | ICD-10-CM

## 2018-07-17 DIAGNOSIS — F419 Anxiety disorder, unspecified: Principal | ICD-10-CM

## 2018-07-17 DIAGNOSIS — M519 Unspecified thoracic, thoracolumbar and lumbosacral intervertebral disc disorder: Principal | ICD-10-CM

## 2018-07-17 DIAGNOSIS — Z6841 Body Mass Index (BMI) 40.0 and over, adult: Principal | ICD-10-CM

## 2018-07-17 DIAGNOSIS — L02416 Cutaneous abscess of left lower limb: Principal | ICD-10-CM

## 2018-07-17 DIAGNOSIS — E1165 Type 2 diabetes mellitus with hyperglycemia: Principal | ICD-10-CM

## 2018-07-17 DIAGNOSIS — G894 Chronic pain syndrome: Principal | ICD-10-CM

## 2018-07-17 DIAGNOSIS — Z8631 Personal history of diabetic foot ulcer: Principal | ICD-10-CM

## 2018-07-18 ENCOUNTER — Telehealth: Payer: Self-pay | Admitting: Nurse Practitioner

## 2018-07-18 MED ORDER — LISINOPRIL 2.5 MG TABLET
ORAL_TABLET | Freq: Every day | ORAL | 3 refills | 0 days | Status: CP
Start: 2018-07-18 — End: ?

## 2018-07-18 MED ORDER — CALCIUM CARBONATE-VITAMIN D3 250 MG-125 UNIT TABLET
ORAL_TABLET | Freq: Every day | ORAL | 3 refills | 0.00000 days | Status: SS
Start: 2018-07-18 — End: 2018-08-26

## 2018-07-18 NOTE — Telephone Encounter (Signed)
PA request submitted, patient notified.

## 2018-07-18 NOTE — Telephone Encounter (Signed)
Pt called and stated that her oxycodone needs PA she stated it needs to be sent to [facility] in [place]

## 2018-07-19 ENCOUNTER — Other Ambulatory Visit: Payer: Self-pay

## 2018-07-19 ENCOUNTER — Ambulatory Visit: Payer: Medicaid Other | Attending: Nurse Practitioner | Admitting: Nurse Practitioner

## 2018-07-19 DIAGNOSIS — K219 Gastro-esophageal reflux disease without esophagitis: Secondary | ICD-10-CM

## 2018-07-19 DIAGNOSIS — M0579 Rheumatoid arthritis with rheumatoid factor of multiple sites without organ or systems involvement: Secondary | ICD-10-CM

## 2018-07-19 DIAGNOSIS — N318 Other neuromuscular dysfunction of bladder: Secondary | ICD-10-CM

## 2018-07-19 DIAGNOSIS — E11621 Type 2 diabetes mellitus with foot ulcer: Secondary | ICD-10-CM

## 2018-07-19 DIAGNOSIS — I110 Hypertensive heart disease with heart failure: Secondary | ICD-10-CM

## 2018-07-19 DIAGNOSIS — Z6841 Body Mass Index (BMI) 40.0 and over, adult: Secondary | ICD-10-CM

## 2018-07-19 DIAGNOSIS — L03115 Cellulitis of right lower limb: Secondary | ICD-10-CM

## 2018-07-19 DIAGNOSIS — L02416 Cutaneous abscess of left lower limb: Principal | ICD-10-CM

## 2018-07-19 DIAGNOSIS — F319 Bipolar disorder, unspecified: Secondary | ICD-10-CM

## 2018-07-19 DIAGNOSIS — E039 Hypothyroidism, unspecified: Secondary | ICD-10-CM

## 2018-07-19 DIAGNOSIS — M519 Unspecified thoracic, thoracolumbar and lumbosacral intervertebral disc disorder: Secondary | ICD-10-CM

## 2018-07-19 DIAGNOSIS — G4733 Obstructive sleep apnea (adult) (pediatric): Secondary | ICD-10-CM

## 2018-07-19 DIAGNOSIS — G894 Chronic pain syndrome: Secondary | ICD-10-CM

## 2018-07-19 DIAGNOSIS — L97511 Non-pressure chronic ulcer of other part of right foot limited to breakdown of skin: Secondary | ICD-10-CM

## 2018-07-19 DIAGNOSIS — Z8631 Personal history of diabetic foot ulcer: Secondary | ICD-10-CM

## 2018-07-19 DIAGNOSIS — J449 Chronic obstructive pulmonary disease, unspecified: Secondary | ICD-10-CM

## 2018-07-19 DIAGNOSIS — F5105 Insomnia due to other mental disorder: Secondary | ICD-10-CM

## 2018-07-19 DIAGNOSIS — F419 Anxiety disorder, unspecified: Secondary | ICD-10-CM

## 2018-07-19 DIAGNOSIS — Z87891 Personal history of nicotine dependence: Secondary | ICD-10-CM

## 2018-07-19 DIAGNOSIS — E1165 Type 2 diabetes mellitus with hyperglycemia: Secondary | ICD-10-CM

## 2018-07-19 DIAGNOSIS — E1142 Type 2 diabetes mellitus with diabetic polyneuropathy: Secondary | ICD-10-CM

## 2018-07-19 DIAGNOSIS — Z794 Long term (current) use of insulin: Secondary | ICD-10-CM

## 2018-07-19 DIAGNOSIS — I5032 Chronic diastolic (congestive) heart failure: Secondary | ICD-10-CM

## 2018-07-19 DIAGNOSIS — F603 Borderline personality disorder: Secondary | ICD-10-CM

## 2018-07-19 DIAGNOSIS — M792 Neuralgia and neuritis, unspecified: Secondary | ICD-10-CM

## 2018-07-19 DIAGNOSIS — M8949 Other hypertrophic osteoarthropathy, multiple sites: Secondary | ICD-10-CM

## 2018-07-19 DIAGNOSIS — M159 Polyosteoarthritis, unspecified: Secondary | ICD-10-CM

## 2018-07-19 DIAGNOSIS — M150 Primary generalized (osteo)arthritis: Secondary | ICD-10-CM

## 2018-07-19 MED ORDER — OXYCODONE HCL 5 MG PO TABS: 5.0000 mg | ORAL_TABLET | Freq: Four times a day (QID) | ORAL | 0 refills | Status: DC | PRN

## 2018-07-19 MED ORDER — PREGABALIN 150 MG PO CAPS: 150.0000 mg | ORAL_CAPSULE | Freq: Three times a day (TID) | ORAL | 1 refills | Status: DC

## 2018-07-19 NOTE — Progress Notes (Signed)
Pain Management Encounter Note - Virtual Visit via Telephone Telehealth (real-time audio visits between healthcare provider and patient).  Patient's Phone No. & Preferred Pharmacy:  [phone] (home); [phone] (mobile); (Preferred) [phone]  [facility] [place] - [place], [place] - [address] [place] [address] Phone: [phone] Fax: [phone]   Pre-screening note:  Our staff contacted [name] [name] and offered her an "in person", "face-to-face" appointment versus a telephone encounter. She indicated preferring the telephone encounter, at this time.  Reason for Virtual Visit: COVID-19*  Social distancing based on CDC and AMA recommendations.   I contacted [name] GravelDorothy E [name] on 07/19/2018 at 4:29 PM by telephone and clearly identified myself as Thad Rangerrystal King, NP. I verified that I was speaking with the correct person using two identifiers (Name and date of birth: October 23, 1960).  Advanced Informed Consent I sought verbal advanced consent from [name] [name] for telemedicine interactions and virtual visit. I informed [name] [name] of the security and privacy concerns, risks, and limitations associated with performing an evaluation and management service by telephone. I also informed [name] [name] of the availability of "in person" appointments and I informed her of the possibility of a patient responsible charge related to this service. [name] [name] expressed understanding and agreed to proceed.   Historic Elements   [name] is a 58 y.o. year old, female patient evaluated today after her last encounter by our practice on 07/18/2018. [name] [name]  has a past medical history of Abdominal wall hernia (01/29/2013), Anxiety, Arthritis, C. difficile colitis, Chronic diastolic heart failure (HCC), Depression, Diabetes mellitus, Diastolic CHF (HCC), Esophagitis, Fluid retention, GERD (gastroesophageal reflux disease), Hiatal hernia, Hypertension, Hypokalemia due to loss of  potassium (10/21/2015), Hypothyroidism, IBS (irritable bowel syndrome), Moderate episode of recurrent major depressive disorder (HCC) (06/03/2004), Morbid obesity (HCC), MRSA (methicillin resistant Staphylococcus aureus) infection (11/2017), Neurogenic bladder, Neuropathy, Obesity, Panic attacks, Rheumatoid arthritis (HCC), and Sleep apnea. She also  has a past surgical history that includes Tubal ligation; Tonsillectomy; Cholecystectomy; Abdominal hysterectomy; Laparoscopic gastric banding (03/20/07); Eye surgery; Hernia repair; and DG GREAT TOE RIGHT FOOT (02/23/2018). [name] [name] has a current medication list which includes the following prescription(s): aripiprazole, atorvastatin, b-d insulin syringe 1cc/25g, buspirone, calcium carb-ergocalciferol, cephalexin, cholecalciferol, clotrimazole-betamethasone, diclofenac sodium, fluoxetine, folic acid, hydroxychloroquine, hydroxyzine, insulin lispro, klor-con m20, lantus, leflunomide, levothyroxine, lisinopril, loperamide, lovastatin, melatonin, metformin, methotrexate, oxycodone, oxycodone, oxycodone, oxycodone-acetaminophen, phenazopyridine, pregabalin, promethazine, quetiapine, vitamin d (ergocalciferol), and zolpidem, and the following Facility-Administered Medications: lactated ringers. She  reports that she quit smoking about 18 years ago. Her smoking use included cigarettes. She has a 54.00 pack-year smoking history. She has never used smokeless tobacco. She reports that she does not drink alcohol or use drugs. [name] [name] is allergic to codeine; propoxyphene; sulfa antibiotics; and hydrocodone.   HPI  I last saw her on 07/18/2018. She is being evaluated for medication management. 4/10. She is having hand elbow and knee pain. She has is having swelling.  She has admits has had her MTX injection and infusion in 8 months. 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 She is having recurrent wound infections which are requiring hospitalizations. She has  not been able to get her Oxycodone refilled fpr 2 weeks because of PA. She states that should be filled today. She denies any other concerns.   Pharmacotherapy Assessment  Analgesic:Oxycodone IR5 mgone tablet by mouth every 6hours (20mg /dayof oxycodone) MME/day:30mg /day   Monitoring: Pharmacotherapy: No side-effects or adverse reactions reported.  PMP:  PDMP reviewed during this encounter.       Compliance: No problems identified. Plan: Refer to "POC".  Review of recent tests  DG Foot Complete Right Please see detailed radiograph report in office note.   Admission on 06/27/2018, Discharged on 06/27/2018  Component Date Value Ref Range Status  . Color, Urine 06/27/2018 YELLOW  YELLOW Final  . APPearance 06/27/2018 CLOUDY* CLEAR Final  . Specific Gravity, Urine 06/27/2018 1.010  1.005 - 1.030 Final  . pH 06/27/2018 5.0  5.0 - 8.0 Final  . Glucose, UA 06/27/2018 >1000* NEGATIVE mg/dL Final  . Hgb urine dipstick 06/27/2018 TRACE* NEGATIVE Final  . Bilirubin Urine 06/27/2018 NEGATIVE  NEGATIVE Final  . Ketones, ur 06/27/2018 NEGATIVE  NEGATIVE mg/dL Final  . Protein, ur 07/61/5183 NEGATIVE  NEGATIVE mg/dL Final  . Nitrite 43/73/5789 POSITIVE* NEGATIVE Final  . Leukocytes,Ua 06/27/2018 SMALL* NEGATIVE Final  . Squamous Epithelial / LPF 06/27/2018 0-5  0 - 5 Final  . WBC, UA 06/27/2018 >50  0 - 5 WBC/hpf Final  . RBC / HPF 06/27/2018 0-5  0 - 5 RBC/hpf Final  . Bacteria, UA 06/27/2018 FEW* NONE SEEN Final  . WBC Clumps 06/27/2018 PRESENT   Final   Performed at [facility], [address], [place], [place] [address]  . Specimen Description 06/27/2018    Final                   Value:URINE, RANDOM Performed at [facility], [address] [place], [place] [address]   . Special Requests 06/27/2018    Final                   Value:NONE Performed at [facility], [address] [place], [place] [address]   . Culture 06/27/2018  >=100,000 COLONIES/mL ESCHERICHIA COLI*  Final  . Report Status 06/27/2018 06/30/2018 FINAL   Final  . Organism ID, Bacteria 06/27/2018 ESCHERICHIA COLI*  Final   Assessment  The primary encounter diagnosis was Primary osteoarthritis involving multiple joints. Diagnoses of Neurogenic pain, Diabetic peripheral neuropathy (HCC), Neuropathic pain, Chronic pain syndrome, and Rheumatoid arthritis involving multiple sites with positive rheumatoid factor (HCC) were also pertinent to this visit.  Plan of Care  I discussed the assessment and treatment plan with the patient. The patient was provided an opportunity to ask questions and all were answered. The patient agreed with the plan and demonstrated an understanding of the instructions.  Patient advised to call back or seek an in-person evaluation if the symptoms or condition worsens.  I have changed [name] [name]'s oxyCODONE and oxyCODONE. I am also having her maintain her clotrimazole-betamethasone, hydrOXYzine, folic acid, levothyroxine, Klor-Con M20, lovastatin, hydroxychloroquine, lisinopril, loperamide, metFORMIN, B-D INSULIN SYRINGE 1CC/25G, methotrexate, Cholecalciferol, leflunomide, oxyCODONE-acetaminophen, atorvastatin, Calcium Carb-Ergocalciferol, Diclofenac Sodium, Vitamin D (Ergocalciferol), Lantus, Melatonin, promethazine, insulin lispro, phenazopyridine, ARIPiprazole, busPIRone, FLUoxetine, QUEtiapine, zolpidem, cephALEXin, pregabalin, and oxyCODONE. Pharmacotherapy (Medications Ordered): Meds ordered this encounter  Medications  . pregabalin (LYRICA) 150 MG capsule    Sig: Take 1 capsule (150 mg total) by mouth 3 (three) times daily.    Dispense:  270 capsule    Refill:  1    Do not add this medication to the electronic "Automatic Refill" notification system. Patient may have prescription filled one day early if pharmacy is closed on scheduled refill date.    Order Specific Question:   Supervising Provider    Answer:   Yevette Edwards [[id number]]  . oxyCODONE (  OXY IR/ROXICODONE) 5 MG immediate release tablet    Sig: Take 1 tablet (5 mg total) by mouth every 6 (six) hours as needed for up to 30 days for severe pain.    Dispense:  120 tablet    Refill:  0    Do not place this medication, or any other prescription from our practice, on "Automatic Refill". Patient may have prescription filled one day early if pharmacy is closed on scheduled refill date.    Order Specific Question:   Supervising Provider    Answer:   Yevette Edwards [[id number]]  . oxyCODONE (OXY IR/ROXICODONE) 5 MG immediate release tablet    Sig: Take 1 tablet (5 mg total) by mouth every 6 (six) hours as needed for up to 30 days for severe pain.    Dispense:  120 tablet    Refill:  0    Do not place this medication, or any other prescription from our practice, on "Automatic Refill". Patient may have prescription filled one day early if pharmacy is closed on scheduled refill date.    Order Specific Question:   Supervising Provider    Answer:   Yevette Edwards [[id number]]  . DISCONTD: oxyCODONE (OXY IR/ROXICODONE) 5 MG immediate release tablet    Sig: Take 1 tablet (5 mg total) by mouth every 6 (six) hours as needed for up to 30 days for severe pain. TAKE 1 TABLET BY MOUTH EVERY 6 HOURS AS NEEDED SEVERE PAIN    Dispense:  120 tablet    Refill:  0    Do not place this medication, or any other prescription from our practice, on "Automatic Refill". Patient may have prescription filled one day early if pharmacy is closed on scheduled refill date.    Order Specific Question:   Supervising Provider    Answer:   Yevette Edwards [[id number]]  . DISCONTD: oxyCODONE (OXY IR/ROXICODONE) 5 MG immediate release tablet    Sig: Take 1 tablet (5 mg total) by mouth every 6 (six) hours as needed for up to 30 days for severe pain.    Dispense:  120 tablet    Refill:  0    Do not place this medication, or any other prescription from our practice, on "Automatic Refill". Patient may have prescription  filled one day early if pharmacy is closed on scheduled refill date.    Order Specific Question:   Supervising Provider    Answer:   Yevette Edwards [[id number]]  . oxyCODONE (OXY IR/ROXICODONE) 5 MG immediate release tablet    Sig: Take 1 tablet (5 mg total) by mouth every 6 (six) hours as needed for up to 30 days for severe pain.    Dispense:  120 tablet    Refill:  0    Do not place this medication, or any other prescription from our practice, on "Automatic Refill". Patient may have prescription filled one day early if pharmacy is closed on scheduled refill date.    Order Specific Question:   Supervising Provider    Answer:   Yevette Edwards [[id number]]   Orders:  No orders of the defined types were placed in this encounter.  Follow-up plan:   Return in about 3 months (around 10/18/2018) for MedMgmt.   Total duration of non-face-to-face encounter: 18 minutes.  Note by: Thad Ranger, NP Date: 07/19/2018; Time: 4:29 PM  Disclaimer:  * Given the special circumstances of the COVID-19 pandemic, the federal government has announced that the Office for Civil Rights (OCR)  will exercise its enforcement discretion and will not impose penalties on physicians using telehealth in the event of noncompliance with regulatory requirements under the [place] and Accountability Act (HIPAA) in connection with the good faith provision of telehealth during the XX123456 national public health emergency. ([place])

## 2018-07-19 NOTE — Telephone Encounter (Signed)
Dr. Toni Amend completed:  zolpidem (AMBIEN) 5 MG tablet  Medication  Date: 06/01/2018 Department: [facility] Ordering/Authorizing: Clapacs, Jackquline Denmark, MD  Order Providers   Prescribing Provider Encounter Provider  Clapacs, Jackquline Denmark, MD Clapacs, Jackquline Denmark, MD  Outpatient Medication Detail    Disp Refills Start End   zolpidem (AMBIEN) 5 MG tablet 90 tablet 1 06/01/2018    Sig: TAKE 1 TABLET BY MOUTH EVERYDAY AT BEDTIME   Sent to pharmacy as: zolpidem (AMBIEN) 5 MG tablet   Notes to Pharmacy: This request is for a new prescription for a controlled substance as required by Federal/State law.PT REQUEST NEW RX FOR ZOLPIDEM TARTRATE 5 MG TABLET.   E-Prescribing Status: Receipt confirmed by pharmacy (06/01/2018 4:34 PM EST)

## 2018-07-20 ENCOUNTER — Telehealth: Payer: Self-pay | Admitting: Nurse Practitioner

## 2018-07-20 DIAGNOSIS — F319 Bipolar disorder, unspecified: Secondary | ICD-10-CM

## 2018-07-20 DIAGNOSIS — Z87891 Personal history of nicotine dependence: Secondary | ICD-10-CM

## 2018-07-20 DIAGNOSIS — E039 Hypothyroidism, unspecified: Secondary | ICD-10-CM

## 2018-07-20 DIAGNOSIS — M0579 Rheumatoid arthritis with rheumatoid factor of multiple sites without organ or systems involvement: Secondary | ICD-10-CM

## 2018-07-20 DIAGNOSIS — I5032 Chronic diastolic (congestive) heart failure: Secondary | ICD-10-CM

## 2018-07-20 DIAGNOSIS — L02416 Cutaneous abscess of left lower limb: Principal | ICD-10-CM

## 2018-07-20 DIAGNOSIS — K219 Gastro-esophageal reflux disease without esophagitis: Secondary | ICD-10-CM

## 2018-07-20 DIAGNOSIS — I110 Hypertensive heart disease with heart failure: Secondary | ICD-10-CM

## 2018-07-20 DIAGNOSIS — E11621 Type 2 diabetes mellitus with foot ulcer: Secondary | ICD-10-CM

## 2018-07-20 DIAGNOSIS — L03115 Cellulitis of right lower limb: Secondary | ICD-10-CM

## 2018-07-20 DIAGNOSIS — F603 Borderline personality disorder: Secondary | ICD-10-CM

## 2018-07-20 DIAGNOSIS — N318 Other neuromuscular dysfunction of bladder: Secondary | ICD-10-CM

## 2018-07-20 DIAGNOSIS — E1142 Type 2 diabetes mellitus with diabetic polyneuropathy: Secondary | ICD-10-CM

## 2018-07-20 DIAGNOSIS — L97511 Non-pressure chronic ulcer of other part of right foot limited to breakdown of skin: Secondary | ICD-10-CM

## 2018-07-20 DIAGNOSIS — Z6841 Body Mass Index (BMI) 40.0 and over, adult: Secondary | ICD-10-CM

## 2018-07-20 DIAGNOSIS — Z8631 Personal history of diabetic foot ulcer: Secondary | ICD-10-CM

## 2018-07-20 DIAGNOSIS — G4733 Obstructive sleep apnea (adult) (pediatric): Secondary | ICD-10-CM

## 2018-07-20 DIAGNOSIS — G894 Chronic pain syndrome: Secondary | ICD-10-CM

## 2018-07-20 DIAGNOSIS — M519 Unspecified thoracic, thoracolumbar and lumbosacral intervertebral disc disorder: Secondary | ICD-10-CM

## 2018-07-20 DIAGNOSIS — Z794 Long term (current) use of insulin: Secondary | ICD-10-CM

## 2018-07-20 DIAGNOSIS — F419 Anxiety disorder, unspecified: Secondary | ICD-10-CM

## 2018-07-20 DIAGNOSIS — J449 Chronic obstructive pulmonary disease, unspecified: Secondary | ICD-10-CM

## 2018-07-20 DIAGNOSIS — F5105 Insomnia due to other mental disorder: Secondary | ICD-10-CM

## 2018-07-20 DIAGNOSIS — E1165 Type 2 diabetes mellitus with hyperglycemia: Secondary | ICD-10-CM

## 2018-07-20 NOTE — Telephone Encounter (Signed)
Called patient to schedule her next mm appt. She will need a script of Lyrica for July. Her Lyrica runs out June 30 but the rest of her meds are good until 11-16-18.  Thank you

## 2018-07-21 ENCOUNTER — Other Ambulatory Visit: Payer: Self-pay | Admitting: Nurse Practitioner

## 2018-07-21 DIAGNOSIS — M792 Neuralgia and neuritis, unspecified: Secondary | ICD-10-CM

## 2018-07-21 DIAGNOSIS — E1142 Type 2 diabetes mellitus with diabetic polyneuropathy: Secondary | ICD-10-CM

## 2018-07-21 MED ORDER — PREGABALIN 150 MG PO CAPS: 150.0000 mg | ORAL_CAPSULE | Freq: Three times a day (TID) | ORAL | 0 refills | Status: DC

## 2018-07-24 DIAGNOSIS — E11621 Type 2 diabetes mellitus with foot ulcer: Secondary | ICD-10-CM

## 2018-07-24 DIAGNOSIS — N318 Other neuromuscular dysfunction of bladder: Secondary | ICD-10-CM

## 2018-07-24 DIAGNOSIS — G4733 Obstructive sleep apnea (adult) (pediatric): Secondary | ICD-10-CM

## 2018-07-24 DIAGNOSIS — J449 Chronic obstructive pulmonary disease, unspecified: Secondary | ICD-10-CM

## 2018-07-24 DIAGNOSIS — I5032 Chronic diastolic (congestive) heart failure: Secondary | ICD-10-CM

## 2018-07-24 DIAGNOSIS — G894 Chronic pain syndrome: Secondary | ICD-10-CM

## 2018-07-24 DIAGNOSIS — F319 Bipolar disorder, unspecified: Secondary | ICD-10-CM

## 2018-07-24 DIAGNOSIS — L02416 Cutaneous abscess of left lower limb: Principal | ICD-10-CM

## 2018-07-24 DIAGNOSIS — L03115 Cellulitis of right lower limb: Secondary | ICD-10-CM

## 2018-07-24 DIAGNOSIS — K219 Gastro-esophageal reflux disease without esophagitis: Secondary | ICD-10-CM

## 2018-07-24 DIAGNOSIS — F419 Anxiety disorder, unspecified: Secondary | ICD-10-CM

## 2018-07-24 DIAGNOSIS — Z794 Long term (current) use of insulin: Secondary | ICD-10-CM

## 2018-07-24 DIAGNOSIS — M519 Unspecified thoracic, thoracolumbar and lumbosacral intervertebral disc disorder: Secondary | ICD-10-CM

## 2018-07-24 DIAGNOSIS — E1142 Type 2 diabetes mellitus with diabetic polyneuropathy: Secondary | ICD-10-CM

## 2018-07-24 DIAGNOSIS — Z8631 Personal history of diabetic foot ulcer: Secondary | ICD-10-CM

## 2018-07-24 DIAGNOSIS — M0579 Rheumatoid arthritis with rheumatoid factor of multiple sites without organ or systems involvement: Secondary | ICD-10-CM

## 2018-07-24 DIAGNOSIS — Z87891 Personal history of nicotine dependence: Secondary | ICD-10-CM

## 2018-07-24 DIAGNOSIS — E1165 Type 2 diabetes mellitus with hyperglycemia: Secondary | ICD-10-CM

## 2018-07-24 DIAGNOSIS — I110 Hypertensive heart disease with heart failure: Secondary | ICD-10-CM

## 2018-07-24 DIAGNOSIS — F5105 Insomnia due to other mental disorder: Secondary | ICD-10-CM

## 2018-07-24 DIAGNOSIS — F603 Borderline personality disorder: Secondary | ICD-10-CM

## 2018-07-24 DIAGNOSIS — Z6841 Body Mass Index (BMI) 40.0 and over, adult: Secondary | ICD-10-CM

## 2018-07-24 DIAGNOSIS — L97511 Non-pressure chronic ulcer of other part of right foot limited to breakdown of skin: Secondary | ICD-10-CM

## 2018-07-24 DIAGNOSIS — E039 Hypothyroidism, unspecified: Secondary | ICD-10-CM

## 2018-07-28 DIAGNOSIS — E039 Hypothyroidism, unspecified: Secondary | ICD-10-CM

## 2018-07-28 DIAGNOSIS — J449 Chronic obstructive pulmonary disease, unspecified: Secondary | ICD-10-CM

## 2018-07-28 DIAGNOSIS — E11621 Type 2 diabetes mellitus with foot ulcer: Secondary | ICD-10-CM

## 2018-07-28 DIAGNOSIS — F419 Anxiety disorder, unspecified: Secondary | ICD-10-CM

## 2018-07-28 DIAGNOSIS — Z8631 Personal history of diabetic foot ulcer: Secondary | ICD-10-CM

## 2018-07-28 DIAGNOSIS — Z87891 Personal history of nicotine dependence: Secondary | ICD-10-CM

## 2018-07-28 DIAGNOSIS — F603 Borderline personality disorder: Secondary | ICD-10-CM

## 2018-07-28 DIAGNOSIS — G4733 Obstructive sleep apnea (adult) (pediatric): Secondary | ICD-10-CM

## 2018-07-28 DIAGNOSIS — L02416 Cutaneous abscess of left lower limb: Principal | ICD-10-CM

## 2018-07-28 DIAGNOSIS — K219 Gastro-esophageal reflux disease without esophagitis: Secondary | ICD-10-CM

## 2018-07-28 DIAGNOSIS — M0579 Rheumatoid arthritis with rheumatoid factor of multiple sites without organ or systems involvement: Secondary | ICD-10-CM

## 2018-07-28 DIAGNOSIS — G894 Chronic pain syndrome: Secondary | ICD-10-CM

## 2018-07-28 DIAGNOSIS — Z6841 Body Mass Index (BMI) 40.0 and over, adult: Secondary | ICD-10-CM

## 2018-07-28 DIAGNOSIS — L03115 Cellulitis of right lower limb: Secondary | ICD-10-CM

## 2018-07-28 DIAGNOSIS — L97511 Non-pressure chronic ulcer of other part of right foot limited to breakdown of skin: Secondary | ICD-10-CM

## 2018-07-28 DIAGNOSIS — I5032 Chronic diastolic (congestive) heart failure: Secondary | ICD-10-CM

## 2018-07-28 DIAGNOSIS — M519 Unspecified thoracic, thoracolumbar and lumbosacral intervertebral disc disorder: Secondary | ICD-10-CM

## 2018-07-28 DIAGNOSIS — F319 Bipolar disorder, unspecified: Secondary | ICD-10-CM

## 2018-07-28 DIAGNOSIS — I110 Hypertensive heart disease with heart failure: Secondary | ICD-10-CM

## 2018-07-28 DIAGNOSIS — N318 Other neuromuscular dysfunction of bladder: Secondary | ICD-10-CM

## 2018-07-28 DIAGNOSIS — F5105 Insomnia due to other mental disorder: Secondary | ICD-10-CM

## 2018-07-28 DIAGNOSIS — Z794 Long term (current) use of insulin: Secondary | ICD-10-CM

## 2018-07-28 DIAGNOSIS — E1165 Type 2 diabetes mellitus with hyperglycemia: Secondary | ICD-10-CM

## 2018-07-28 DIAGNOSIS — E1142 Type 2 diabetes mellitus with diabetic polyneuropathy: Secondary | ICD-10-CM

## 2018-08-01 ENCOUNTER — Ambulatory Visit
Admit: 2018-08-01 | Discharge: 2018-08-03 | Disposition: A | Discharge status: Home Health | Payer: MEDICAID | Admitting: Vascular Surgery

## 2018-08-01 ENCOUNTER — Encounter
Admit: 2018-08-01 | Discharge: 2018-08-03 | Disposition: A | Discharge status: Home Health | Payer: MEDICAID | Attending: Student in an Organized Health Care Education/Training Program | Admitting: Vascular Surgery

## 2018-08-01 DIAGNOSIS — E039 Hypothyroidism, unspecified: Secondary | ICD-10-CM

## 2018-08-01 DIAGNOSIS — G4733 Obstructive sleep apnea (adult) (pediatric): Secondary | ICD-10-CM

## 2018-08-01 DIAGNOSIS — F419 Anxiety disorder, unspecified: Secondary | ICD-10-CM

## 2018-08-01 DIAGNOSIS — J449 Chronic obstructive pulmonary disease, unspecified: Secondary | ICD-10-CM

## 2018-08-01 DIAGNOSIS — F603 Borderline personality disorder: Secondary | ICD-10-CM

## 2018-08-01 DIAGNOSIS — I110 Hypertensive heart disease with heart failure: Secondary | ICD-10-CM

## 2018-08-01 DIAGNOSIS — Z8631 Personal history of diabetic foot ulcer: Secondary | ICD-10-CM

## 2018-08-01 DIAGNOSIS — M0579 Rheumatoid arthritis with rheumatoid factor of multiple sites without organ or systems involvement: Secondary | ICD-10-CM

## 2018-08-01 DIAGNOSIS — L03031 Cellulitis of right toe: Principal | ICD-10-CM

## 2018-08-01 DIAGNOSIS — Z794 Long term (current) use of insulin: Secondary | ICD-10-CM

## 2018-08-01 DIAGNOSIS — Z6841 Body Mass Index (BMI) 40.0 and over, adult: Secondary | ICD-10-CM

## 2018-08-01 DIAGNOSIS — N318 Other neuromuscular dysfunction of bladder: Secondary | ICD-10-CM

## 2018-08-01 DIAGNOSIS — L97511 Non-pressure chronic ulcer of other part of right foot limited to breakdown of skin: Secondary | ICD-10-CM

## 2018-08-01 DIAGNOSIS — F319 Bipolar disorder, unspecified: Secondary | ICD-10-CM

## 2018-08-01 DIAGNOSIS — L02416 Cutaneous abscess of left lower limb: Principal | ICD-10-CM

## 2018-08-01 DIAGNOSIS — G894 Chronic pain syndrome: Secondary | ICD-10-CM

## 2018-08-01 DIAGNOSIS — E1165 Type 2 diabetes mellitus with hyperglycemia: Secondary | ICD-10-CM

## 2018-08-01 DIAGNOSIS — F5105 Insomnia due to other mental disorder: Secondary | ICD-10-CM

## 2018-08-01 DIAGNOSIS — K219 Gastro-esophageal reflux disease without esophagitis: Secondary | ICD-10-CM

## 2018-08-01 DIAGNOSIS — E11621 Type 2 diabetes mellitus with foot ulcer: Secondary | ICD-10-CM

## 2018-08-01 DIAGNOSIS — L03115 Cellulitis of right lower limb: Secondary | ICD-10-CM

## 2018-08-01 DIAGNOSIS — M519 Unspecified thoracic, thoracolumbar and lumbosacral intervertebral disc disorder: Secondary | ICD-10-CM

## 2018-08-01 DIAGNOSIS — Z87891 Personal history of nicotine dependence: Secondary | ICD-10-CM

## 2018-08-01 DIAGNOSIS — I5032 Chronic diastolic (congestive) heart failure: Secondary | ICD-10-CM

## 2018-08-01 DIAGNOSIS — E1142 Type 2 diabetes mellitus with diabetic polyneuropathy: Secondary | ICD-10-CM

## 2018-08-02 DIAGNOSIS — E039 Hypothyroidism, unspecified: Secondary | ICD-10-CM

## 2018-08-02 DIAGNOSIS — L02416 Cutaneous abscess of left lower limb: Principal | ICD-10-CM

## 2018-08-02 DIAGNOSIS — E1165 Type 2 diabetes mellitus with hyperglycemia: Secondary | ICD-10-CM

## 2018-08-02 DIAGNOSIS — L03115 Cellulitis of right lower limb: Secondary | ICD-10-CM

## 2018-08-02 DIAGNOSIS — F5105 Insomnia due to other mental disorder: Secondary | ICD-10-CM

## 2018-08-02 DIAGNOSIS — F419 Anxiety disorder, unspecified: Secondary | ICD-10-CM

## 2018-08-02 DIAGNOSIS — F319 Bipolar disorder, unspecified: Secondary | ICD-10-CM

## 2018-08-02 DIAGNOSIS — M0579 Rheumatoid arthritis with rheumatoid factor of multiple sites without organ or systems involvement: Secondary | ICD-10-CM

## 2018-08-02 DIAGNOSIS — E1142 Type 2 diabetes mellitus with diabetic polyneuropathy: Secondary | ICD-10-CM

## 2018-08-02 DIAGNOSIS — M519 Unspecified thoracic, thoracolumbar and lumbosacral intervertebral disc disorder: Secondary | ICD-10-CM

## 2018-08-02 DIAGNOSIS — G894 Chronic pain syndrome: Secondary | ICD-10-CM

## 2018-08-02 DIAGNOSIS — I5032 Chronic diastolic (congestive) heart failure: Secondary | ICD-10-CM

## 2018-08-02 DIAGNOSIS — J449 Chronic obstructive pulmonary disease, unspecified: Secondary | ICD-10-CM

## 2018-08-02 DIAGNOSIS — L97511 Non-pressure chronic ulcer of other part of right foot limited to breakdown of skin: Secondary | ICD-10-CM

## 2018-08-02 DIAGNOSIS — L03031 Cellulitis of right toe: Principal | ICD-10-CM

## 2018-08-02 DIAGNOSIS — Z794 Long term (current) use of insulin: Secondary | ICD-10-CM

## 2018-08-02 DIAGNOSIS — Z8631 Personal history of diabetic foot ulcer: Secondary | ICD-10-CM

## 2018-08-02 DIAGNOSIS — K219 Gastro-esophageal reflux disease without esophagitis: Secondary | ICD-10-CM

## 2018-08-02 DIAGNOSIS — F603 Borderline personality disorder: Secondary | ICD-10-CM

## 2018-08-02 DIAGNOSIS — I110 Hypertensive heart disease with heart failure: Secondary | ICD-10-CM

## 2018-08-02 DIAGNOSIS — Z6841 Body Mass Index (BMI) 40.0 and over, adult: Secondary | ICD-10-CM

## 2018-08-02 DIAGNOSIS — N318 Other neuromuscular dysfunction of bladder: Secondary | ICD-10-CM

## 2018-08-02 DIAGNOSIS — Z87891 Personal history of nicotine dependence: Secondary | ICD-10-CM

## 2018-08-02 DIAGNOSIS — E11621 Type 2 diabetes mellitus with foot ulcer: Secondary | ICD-10-CM

## 2018-08-02 DIAGNOSIS — G4733 Obstructive sleep apnea (adult) (pediatric): Secondary | ICD-10-CM

## 2018-08-03 DIAGNOSIS — M0579 Rheumatoid arthritis with rheumatoid factor of multiple sites without organ or systems involvement: Secondary | ICD-10-CM

## 2018-08-03 DIAGNOSIS — E1142 Type 2 diabetes mellitus with diabetic polyneuropathy: Secondary | ICD-10-CM

## 2018-08-03 DIAGNOSIS — I5032 Chronic diastolic (congestive) heart failure: Secondary | ICD-10-CM

## 2018-08-03 DIAGNOSIS — E039 Hypothyroidism, unspecified: Secondary | ICD-10-CM

## 2018-08-03 DIAGNOSIS — F603 Borderline personality disorder: Secondary | ICD-10-CM

## 2018-08-03 DIAGNOSIS — F319 Bipolar disorder, unspecified: Secondary | ICD-10-CM

## 2018-08-03 DIAGNOSIS — L03115 Cellulitis of right lower limb: Secondary | ICD-10-CM

## 2018-08-03 DIAGNOSIS — I110 Hypertensive heart disease with heart failure: Secondary | ICD-10-CM

## 2018-08-03 DIAGNOSIS — G894 Chronic pain syndrome: Secondary | ICD-10-CM

## 2018-08-03 DIAGNOSIS — Z6841 Body Mass Index (BMI) 40.0 and over, adult: Secondary | ICD-10-CM

## 2018-08-03 DIAGNOSIS — F5105 Insomnia due to other mental disorder: Secondary | ICD-10-CM

## 2018-08-03 DIAGNOSIS — F419 Anxiety disorder, unspecified: Secondary | ICD-10-CM

## 2018-08-03 DIAGNOSIS — Z794 Long term (current) use of insulin: Secondary | ICD-10-CM

## 2018-08-03 DIAGNOSIS — J449 Chronic obstructive pulmonary disease, unspecified: Secondary | ICD-10-CM

## 2018-08-03 DIAGNOSIS — L97511 Non-pressure chronic ulcer of other part of right foot limited to breakdown of skin: Secondary | ICD-10-CM

## 2018-08-03 DIAGNOSIS — M519 Unspecified thoracic, thoracolumbar and lumbosacral intervertebral disc disorder: Secondary | ICD-10-CM

## 2018-08-03 DIAGNOSIS — L02416 Cutaneous abscess of left lower limb: Principal | ICD-10-CM

## 2018-08-03 DIAGNOSIS — K219 Gastro-esophageal reflux disease without esophagitis: Secondary | ICD-10-CM

## 2018-08-03 DIAGNOSIS — Z87891 Personal history of nicotine dependence: Secondary | ICD-10-CM

## 2018-08-03 DIAGNOSIS — G4733 Obstructive sleep apnea (adult) (pediatric): Secondary | ICD-10-CM

## 2018-08-03 DIAGNOSIS — N318 Other neuromuscular dysfunction of bladder: Secondary | ICD-10-CM

## 2018-08-03 DIAGNOSIS — Z8631 Personal history of diabetic foot ulcer: Secondary | ICD-10-CM

## 2018-08-03 DIAGNOSIS — E1165 Type 2 diabetes mellitus with hyperglycemia: Secondary | ICD-10-CM

## 2018-08-03 DIAGNOSIS — E11621 Type 2 diabetes mellitus with foot ulcer: Secondary | ICD-10-CM

## 2018-08-03 MED ORDER — LEVOFLOXACIN 750 MG TABLET
ORAL_TABLET | Freq: Every day | ORAL | 0 refills | 0 days | Status: SS
Start: 2018-08-03 — End: 2018-08-26

## 2018-08-03 MED ORDER — DOXYCYCLINE HYCLATE 100 MG CAPSULE
ORAL_CAPSULE | Freq: Two times a day (BID) | ORAL | 0 refills | 0 days | Status: CP
Start: 2018-08-03 — End: 2018-08-27

## 2018-08-03 MED ORDER — OXYCODONE 5 MG TABLET
ORAL_TABLET | ORAL | 0 refills | 0 days | Status: CP | PRN
Start: 2018-08-03 — End: 2018-08-27

## 2018-08-04 DIAGNOSIS — F5105 Insomnia due to other mental disorder: Secondary | ICD-10-CM

## 2018-08-04 DIAGNOSIS — Z87891 Personal history of nicotine dependence: Secondary | ICD-10-CM

## 2018-08-04 DIAGNOSIS — F603 Borderline personality disorder: Secondary | ICD-10-CM

## 2018-08-04 DIAGNOSIS — Z8631 Personal history of diabetic foot ulcer: Secondary | ICD-10-CM

## 2018-08-04 DIAGNOSIS — L02416 Cutaneous abscess of left lower limb: Principal | ICD-10-CM

## 2018-08-04 DIAGNOSIS — G4733 Obstructive sleep apnea (adult) (pediatric): Secondary | ICD-10-CM

## 2018-08-04 DIAGNOSIS — Z794 Long term (current) use of insulin: Secondary | ICD-10-CM

## 2018-08-04 DIAGNOSIS — L97511 Non-pressure chronic ulcer of other part of right foot limited to breakdown of skin: Secondary | ICD-10-CM

## 2018-08-04 DIAGNOSIS — F319 Bipolar disorder, unspecified: Secondary | ICD-10-CM

## 2018-08-04 DIAGNOSIS — E1142 Type 2 diabetes mellitus with diabetic polyneuropathy: Secondary | ICD-10-CM

## 2018-08-04 DIAGNOSIS — G894 Chronic pain syndrome: Secondary | ICD-10-CM

## 2018-08-04 DIAGNOSIS — I110 Hypertensive heart disease with heart failure: Secondary | ICD-10-CM

## 2018-08-04 DIAGNOSIS — I5032 Chronic diastolic (congestive) heart failure: Secondary | ICD-10-CM

## 2018-08-04 DIAGNOSIS — E11621 Type 2 diabetes mellitus with foot ulcer: Secondary | ICD-10-CM

## 2018-08-04 DIAGNOSIS — J449 Chronic obstructive pulmonary disease, unspecified: Secondary | ICD-10-CM

## 2018-08-04 DIAGNOSIS — M519 Unspecified thoracic, thoracolumbar and lumbosacral intervertebral disc disorder: Secondary | ICD-10-CM

## 2018-08-04 DIAGNOSIS — E039 Hypothyroidism, unspecified: Secondary | ICD-10-CM

## 2018-08-04 DIAGNOSIS — N318 Other neuromuscular dysfunction of bladder: Secondary | ICD-10-CM

## 2018-08-04 DIAGNOSIS — L03115 Cellulitis of right lower limb: Secondary | ICD-10-CM

## 2018-08-04 DIAGNOSIS — Z6841 Body Mass Index (BMI) 40.0 and over, adult: Secondary | ICD-10-CM

## 2018-08-04 DIAGNOSIS — K219 Gastro-esophageal reflux disease without esophagitis: Secondary | ICD-10-CM

## 2018-08-04 DIAGNOSIS — E1165 Type 2 diabetes mellitus with hyperglycemia: Secondary | ICD-10-CM

## 2018-08-04 DIAGNOSIS — F419 Anxiety disorder, unspecified: Secondary | ICD-10-CM

## 2018-08-04 DIAGNOSIS — M0579 Rheumatoid arthritis with rheumatoid factor of multiple sites without organ or systems involvement: Secondary | ICD-10-CM

## 2018-08-07 DIAGNOSIS — I110 Hypertensive heart disease with heart failure: Secondary | ICD-10-CM

## 2018-08-07 DIAGNOSIS — Z794 Long term (current) use of insulin: Secondary | ICD-10-CM

## 2018-08-07 DIAGNOSIS — K219 Gastro-esophageal reflux disease without esophagitis: Secondary | ICD-10-CM

## 2018-08-07 DIAGNOSIS — M519 Unspecified thoracic, thoracolumbar and lumbosacral intervertebral disc disorder: Secondary | ICD-10-CM

## 2018-08-07 DIAGNOSIS — Z87891 Personal history of nicotine dependence: Secondary | ICD-10-CM

## 2018-08-07 DIAGNOSIS — F603 Borderline personality disorder: Secondary | ICD-10-CM

## 2018-08-07 DIAGNOSIS — G894 Chronic pain syndrome: Secondary | ICD-10-CM

## 2018-08-07 DIAGNOSIS — E039 Hypothyroidism, unspecified: Secondary | ICD-10-CM

## 2018-08-07 DIAGNOSIS — L02416 Cutaneous abscess of left lower limb: Principal | ICD-10-CM

## 2018-08-07 DIAGNOSIS — G4733 Obstructive sleep apnea (adult) (pediatric): Secondary | ICD-10-CM

## 2018-08-07 DIAGNOSIS — F319 Bipolar disorder, unspecified: Secondary | ICD-10-CM

## 2018-08-07 DIAGNOSIS — N318 Other neuromuscular dysfunction of bladder: Secondary | ICD-10-CM

## 2018-08-07 DIAGNOSIS — E11621 Type 2 diabetes mellitus with foot ulcer: Secondary | ICD-10-CM

## 2018-08-07 DIAGNOSIS — E1142 Type 2 diabetes mellitus with diabetic polyneuropathy: Secondary | ICD-10-CM

## 2018-08-07 DIAGNOSIS — L03115 Cellulitis of right lower limb: Secondary | ICD-10-CM

## 2018-08-07 DIAGNOSIS — M0579 Rheumatoid arthritis with rheumatoid factor of multiple sites without organ or systems involvement: Secondary | ICD-10-CM

## 2018-08-07 DIAGNOSIS — Z8631 Personal history of diabetic foot ulcer: Secondary | ICD-10-CM

## 2018-08-07 DIAGNOSIS — L97511 Non-pressure chronic ulcer of other part of right foot limited to breakdown of skin: Secondary | ICD-10-CM

## 2018-08-07 DIAGNOSIS — I5032 Chronic diastolic (congestive) heart failure: Secondary | ICD-10-CM

## 2018-08-07 DIAGNOSIS — E1165 Type 2 diabetes mellitus with hyperglycemia: Secondary | ICD-10-CM

## 2018-08-07 DIAGNOSIS — F419 Anxiety disorder, unspecified: Secondary | ICD-10-CM

## 2018-08-07 DIAGNOSIS — F5105 Insomnia due to other mental disorder: Secondary | ICD-10-CM

## 2018-08-07 DIAGNOSIS — Z6841 Body Mass Index (BMI) 40.0 and over, adult: Secondary | ICD-10-CM

## 2018-08-07 DIAGNOSIS — J449 Chronic obstructive pulmonary disease, unspecified: Secondary | ICD-10-CM

## 2018-08-07 MED ORDER — LEVOTHYROXINE 88 MCG TABLET
ORAL_TABLET | 1 refills | 0 days | Status: CP
Start: 2018-08-07 — End: ?

## 2018-08-09 MED ORDER — LANTUS U-100 INSULIN 100 UNIT/ML SUBCUTANEOUS SOLUTION
Freq: Every day | SUBCUTANEOUS | 1 refills | 0 days | Status: SS
Start: 2018-08-09 — End: 2018-08-27

## 2018-08-09 MED ORDER — INSULIN LISPRO (U-100) 100 UNIT/ML SUBCUTANEOUS SOLUTION
1 refills | 0 days | Status: SS
Start: 2018-08-09 — End: 2018-08-27

## 2018-08-11 ENCOUNTER — Ambulatory Visit
Admit: 2018-08-11 | Discharge: 2018-08-12 | Payer: MEDICAID | Attending: Foot & Ankle Surgery | Primary: Foot & Ankle Surgery

## 2018-08-11 DIAGNOSIS — S98131A Complete traumatic amputation of one right lesser toe, initial encounter: Secondary | ICD-10-CM

## 2018-08-11 DIAGNOSIS — E11621 Type 2 diabetes mellitus with foot ulcer: Principal | ICD-10-CM

## 2018-08-11 DIAGNOSIS — L97412 Non-pressure chronic ulcer of right heel and midfoot with fat layer exposed: Secondary | ICD-10-CM

## 2018-08-14 DIAGNOSIS — Z8631 Personal history of diabetic foot ulcer: Secondary | ICD-10-CM

## 2018-08-14 DIAGNOSIS — Z4781 Encounter for orthopedic aftercare following surgical amputation: Principal | ICD-10-CM

## 2018-08-14 DIAGNOSIS — G4733 Obstructive sleep apnea (adult) (pediatric): Secondary | ICD-10-CM

## 2018-08-14 DIAGNOSIS — Z6841 Body Mass Index (BMI) 40.0 and over, adult: Secondary | ICD-10-CM

## 2018-08-14 DIAGNOSIS — J449 Chronic obstructive pulmonary disease, unspecified: Secondary | ICD-10-CM

## 2018-08-14 DIAGNOSIS — F319 Bipolar disorder, unspecified: Secondary | ICD-10-CM

## 2018-08-14 DIAGNOSIS — I5032 Chronic diastolic (congestive) heart failure: Secondary | ICD-10-CM

## 2018-08-14 DIAGNOSIS — F5105 Insomnia due to other mental disorder: Secondary | ICD-10-CM

## 2018-08-14 DIAGNOSIS — M519 Unspecified thoracic, thoracolumbar and lumbosacral intervertebral disc disorder: Secondary | ICD-10-CM

## 2018-08-14 DIAGNOSIS — M0579 Rheumatoid arthritis with rheumatoid factor of multiple sites without organ or systems involvement: Secondary | ICD-10-CM

## 2018-08-14 DIAGNOSIS — F419 Anxiety disorder, unspecified: Secondary | ICD-10-CM

## 2018-08-14 DIAGNOSIS — Z89421 Acquired absence of other right toe(s): Secondary | ICD-10-CM

## 2018-08-14 DIAGNOSIS — Z794 Long term (current) use of insulin: Secondary | ICD-10-CM

## 2018-08-14 DIAGNOSIS — E1142 Type 2 diabetes mellitus with diabetic polyneuropathy: Secondary | ICD-10-CM

## 2018-08-14 DIAGNOSIS — G894 Chronic pain syndrome: Secondary | ICD-10-CM

## 2018-08-14 DIAGNOSIS — I110 Hypertensive heart disease with heart failure: Secondary | ICD-10-CM

## 2018-08-14 DIAGNOSIS — K219 Gastro-esophageal reflux disease without esophagitis: Secondary | ICD-10-CM

## 2018-08-14 DIAGNOSIS — Z602 Problems related to living alone: Secondary | ICD-10-CM

## 2018-08-14 DIAGNOSIS — F603 Borderline personality disorder: Secondary | ICD-10-CM

## 2018-08-14 DIAGNOSIS — E1165 Type 2 diabetes mellitus with hyperglycemia: Secondary | ICD-10-CM

## 2018-08-14 DIAGNOSIS — Z87891 Personal history of nicotine dependence: Secondary | ICD-10-CM

## 2018-08-14 DIAGNOSIS — N318 Other neuromuscular dysfunction of bladder: Secondary | ICD-10-CM

## 2018-08-14 DIAGNOSIS — Z8614 Personal history of Methicillin resistant Staphylococcus aureus infection: Secondary | ICD-10-CM

## 2018-08-14 DIAGNOSIS — E039 Hypothyroidism, unspecified: Secondary | ICD-10-CM

## 2018-08-16 ENCOUNTER — Encounter: Admit: 2018-08-16 | Discharge: 2018-09-14 | Payer: MEDICAID

## 2018-08-16 DIAGNOSIS — M0579 Rheumatoid arthritis with rheumatoid factor of multiple sites without organ or systems involvement: Secondary | ICD-10-CM

## 2018-08-16 DIAGNOSIS — Z4781 Encounter for orthopedic aftercare following surgical amputation: Principal | ICD-10-CM

## 2018-08-16 DIAGNOSIS — K219 Gastro-esophageal reflux disease without esophagitis: Secondary | ICD-10-CM

## 2018-08-16 DIAGNOSIS — I110 Hypertensive heart disease with heart failure: Secondary | ICD-10-CM

## 2018-08-16 DIAGNOSIS — G4733 Obstructive sleep apnea (adult) (pediatric): Secondary | ICD-10-CM

## 2018-08-16 DIAGNOSIS — Z8631 Personal history of diabetic foot ulcer: Secondary | ICD-10-CM

## 2018-08-16 DIAGNOSIS — F5105 Insomnia due to other mental disorder: Secondary | ICD-10-CM

## 2018-08-16 DIAGNOSIS — Z6841 Body Mass Index (BMI) 40.0 and over, adult: Secondary | ICD-10-CM

## 2018-08-16 DIAGNOSIS — M519 Unspecified thoracic, thoracolumbar and lumbosacral intervertebral disc disorder: Secondary | ICD-10-CM

## 2018-08-16 DIAGNOSIS — I5032 Chronic diastolic (congestive) heart failure: Secondary | ICD-10-CM

## 2018-08-16 DIAGNOSIS — G894 Chronic pain syndrome: Secondary | ICD-10-CM

## 2018-08-16 DIAGNOSIS — E1165 Type 2 diabetes mellitus with hyperglycemia: Secondary | ICD-10-CM

## 2018-08-16 DIAGNOSIS — Z87891 Personal history of nicotine dependence: Secondary | ICD-10-CM

## 2018-08-16 DIAGNOSIS — F319 Bipolar disorder, unspecified: Secondary | ICD-10-CM

## 2018-08-16 DIAGNOSIS — F603 Borderline personality disorder: Secondary | ICD-10-CM

## 2018-08-16 DIAGNOSIS — Z602 Problems related to living alone: Secondary | ICD-10-CM

## 2018-08-16 DIAGNOSIS — Z89421 Acquired absence of other right toe(s): Secondary | ICD-10-CM

## 2018-08-16 DIAGNOSIS — E1142 Type 2 diabetes mellitus with diabetic polyneuropathy: Secondary | ICD-10-CM

## 2018-08-16 DIAGNOSIS — N318 Other neuromuscular dysfunction of bladder: Secondary | ICD-10-CM

## 2018-08-16 DIAGNOSIS — Z8614 Personal history of Methicillin resistant Staphylococcus aureus infection: Secondary | ICD-10-CM

## 2018-08-16 DIAGNOSIS — E039 Hypothyroidism, unspecified: Secondary | ICD-10-CM

## 2018-08-16 DIAGNOSIS — F419 Anxiety disorder, unspecified: Secondary | ICD-10-CM

## 2018-08-16 DIAGNOSIS — Z794 Long term (current) use of insulin: Secondary | ICD-10-CM

## 2018-08-16 DIAGNOSIS — J449 Chronic obstructive pulmonary disease, unspecified: Secondary | ICD-10-CM

## 2018-08-21 ENCOUNTER — Ambulatory Visit: Admit: 2018-08-21 | Discharge: 2018-08-27 | Disposition: A | Discharge status: Home Health | Payer: MEDICAID

## 2018-08-21 ENCOUNTER — Encounter
Admit: 2018-08-21 | Discharge: 2018-08-27 | Disposition: A | Discharge status: Home Health | Payer: MEDICAID | Attending: Student in an Organized Health Care Education/Training Program

## 2018-08-21 DIAGNOSIS — Z8631 Personal history of diabetic foot ulcer: Secondary | ICD-10-CM

## 2018-08-21 DIAGNOSIS — M519 Unspecified thoracic, thoracolumbar and lumbosacral intervertebral disc disorder: Secondary | ICD-10-CM

## 2018-08-21 DIAGNOSIS — F5105 Insomnia due to other mental disorder: Secondary | ICD-10-CM

## 2018-08-21 DIAGNOSIS — Z6841 Body Mass Index (BMI) 40.0 and over, adult: Secondary | ICD-10-CM

## 2018-08-21 DIAGNOSIS — F603 Borderline personality disorder: Secondary | ICD-10-CM

## 2018-08-21 DIAGNOSIS — J449 Chronic obstructive pulmonary disease, unspecified: Secondary | ICD-10-CM

## 2018-08-21 DIAGNOSIS — K219 Gastro-esophageal reflux disease without esophagitis: Secondary | ICD-10-CM

## 2018-08-21 DIAGNOSIS — T8743 Infection of amputation stump, right lower extremity: Principal | ICD-10-CM

## 2018-08-21 DIAGNOSIS — Z794 Long term (current) use of insulin: Secondary | ICD-10-CM

## 2018-08-21 DIAGNOSIS — Z602 Problems related to living alone: Secondary | ICD-10-CM

## 2018-08-21 DIAGNOSIS — Z87891 Personal history of nicotine dependence: Secondary | ICD-10-CM

## 2018-08-21 DIAGNOSIS — F419 Anxiety disorder, unspecified: Secondary | ICD-10-CM

## 2018-08-21 DIAGNOSIS — I110 Hypertensive heart disease with heart failure: Secondary | ICD-10-CM

## 2018-08-21 DIAGNOSIS — Z89421 Acquired absence of other right toe(s): Secondary | ICD-10-CM

## 2018-08-21 DIAGNOSIS — G894 Chronic pain syndrome: Secondary | ICD-10-CM

## 2018-08-21 DIAGNOSIS — I5032 Chronic diastolic (congestive) heart failure: Secondary | ICD-10-CM

## 2018-08-21 DIAGNOSIS — E1142 Type 2 diabetes mellitus with diabetic polyneuropathy: Secondary | ICD-10-CM

## 2018-08-21 DIAGNOSIS — E1165 Type 2 diabetes mellitus with hyperglycemia: Secondary | ICD-10-CM

## 2018-08-21 DIAGNOSIS — N318 Other neuromuscular dysfunction of bladder: Secondary | ICD-10-CM

## 2018-08-21 DIAGNOSIS — Z4781 Encounter for orthopedic aftercare following surgical amputation: Principal | ICD-10-CM

## 2018-08-21 DIAGNOSIS — Z8614 Personal history of Methicillin resistant Staphylococcus aureus infection: Secondary | ICD-10-CM

## 2018-08-21 DIAGNOSIS — G4733 Obstructive sleep apnea (adult) (pediatric): Secondary | ICD-10-CM

## 2018-08-21 DIAGNOSIS — E039 Hypothyroidism, unspecified: Secondary | ICD-10-CM

## 2018-08-21 DIAGNOSIS — M0579 Rheumatoid arthritis with rheumatoid factor of multiple sites without organ or systems involvement: Secondary | ICD-10-CM

## 2018-08-21 DIAGNOSIS — F319 Bipolar disorder, unspecified: Secondary | ICD-10-CM

## 2018-08-22 DIAGNOSIS — T8743 Infection of amputation stump, right lower extremity: Principal | ICD-10-CM

## 2018-08-23 DIAGNOSIS — F5105 Insomnia due to other mental disorder: Secondary | ICD-10-CM

## 2018-08-23 DIAGNOSIS — M519 Unspecified thoracic, thoracolumbar and lumbosacral intervertebral disc disorder: Secondary | ICD-10-CM

## 2018-08-23 DIAGNOSIS — Z794 Long term (current) use of insulin: Secondary | ICD-10-CM

## 2018-08-23 DIAGNOSIS — J449 Chronic obstructive pulmonary disease, unspecified: Secondary | ICD-10-CM

## 2018-08-23 DIAGNOSIS — F319 Bipolar disorder, unspecified: Secondary | ICD-10-CM

## 2018-08-23 DIAGNOSIS — Z8614 Personal history of Methicillin resistant Staphylococcus aureus infection: Secondary | ICD-10-CM

## 2018-08-23 DIAGNOSIS — F419 Anxiety disorder, unspecified: Secondary | ICD-10-CM

## 2018-08-23 DIAGNOSIS — E1142 Type 2 diabetes mellitus with diabetic polyneuropathy: Secondary | ICD-10-CM

## 2018-08-23 DIAGNOSIS — Z8631 Personal history of diabetic foot ulcer: Secondary | ICD-10-CM

## 2018-08-23 DIAGNOSIS — Z89421 Acquired absence of other right toe(s): Secondary | ICD-10-CM

## 2018-08-23 DIAGNOSIS — I110 Hypertensive heart disease with heart failure: Secondary | ICD-10-CM

## 2018-08-23 DIAGNOSIS — I5032 Chronic diastolic (congestive) heart failure: Secondary | ICD-10-CM

## 2018-08-23 DIAGNOSIS — G894 Chronic pain syndrome: Secondary | ICD-10-CM

## 2018-08-23 DIAGNOSIS — F603 Borderline personality disorder: Secondary | ICD-10-CM

## 2018-08-23 DIAGNOSIS — M0579 Rheumatoid arthritis with rheumatoid factor of multiple sites without organ or systems involvement: Secondary | ICD-10-CM

## 2018-08-23 DIAGNOSIS — Z4781 Encounter for orthopedic aftercare following surgical amputation: Principal | ICD-10-CM

## 2018-08-23 DIAGNOSIS — E1165 Type 2 diabetes mellitus with hyperglycemia: Secondary | ICD-10-CM

## 2018-08-23 DIAGNOSIS — T8743 Infection of amputation stump, right lower extremity: Principal | ICD-10-CM

## 2018-08-23 DIAGNOSIS — Z6841 Body Mass Index (BMI) 40.0 and over, adult: Secondary | ICD-10-CM

## 2018-08-23 DIAGNOSIS — N318 Other neuromuscular dysfunction of bladder: Secondary | ICD-10-CM

## 2018-08-23 DIAGNOSIS — Z87891 Personal history of nicotine dependence: Secondary | ICD-10-CM

## 2018-08-23 DIAGNOSIS — K219 Gastro-esophageal reflux disease without esophagitis: Secondary | ICD-10-CM

## 2018-08-23 DIAGNOSIS — Z602 Problems related to living alone: Secondary | ICD-10-CM

## 2018-08-23 DIAGNOSIS — G4733 Obstructive sleep apnea (adult) (pediatric): Secondary | ICD-10-CM

## 2018-08-23 DIAGNOSIS — E039 Hypothyroidism, unspecified: Secondary | ICD-10-CM

## 2018-08-24 DIAGNOSIS — T8743 Infection of amputation stump, right lower extremity: Principal | ICD-10-CM

## 2018-08-26 DIAGNOSIS — T8743 Infection of amputation stump, right lower extremity: Principal | ICD-10-CM

## 2018-08-27 MED ORDER — VANCOMYCIN 1 GRAM/200 ML IN DEXTROSE 5 % INTRAVENOUS PIGGYBACK
Freq: Two times a day (BID) | INTRAVENOUS | 0 refills | 0.00000 days | Status: CP
Start: 2018-08-27 — End: 2018-10-11

## 2018-08-27 MED ORDER — INSULIN LISPRO (U-100) 100 UNIT/ML SUBCUTANEOUS SOLUTION
1 refills | 0.00000 days | Status: CP
Start: 2018-08-27 — End: 2019-08-27

## 2018-08-27 MED ORDER — SENNOSIDES 8.6 MG TABLET
ORAL_TABLET | Freq: Every evening | ORAL | 0 refills | 0.00000 days | Status: CP | PRN
Start: 2018-08-27 — End: 2019-10-04

## 2018-08-27 MED ORDER — OXYCODONE 10 MG TABLET
ORAL_TABLET | ORAL | 0 refills | 0.00000 days | Status: CP | PRN
Start: 2018-08-27 — End: 2018-09-25

## 2018-08-27 MED ORDER — LANTUS U-100 INSULIN 100 UNIT/ML SUBCUTANEOUS SOLUTION
Freq: Every day | SUBCUTANEOUS | 0 refills | 0.00000 days | Status: CP
Start: 2018-08-27 — End: 2019-10-04

## 2018-08-28 MED ORDER — ERTAPENEM (INVANZ) IN 100 ML NS IVPB
INTRAVENOUS | 0 refills | 0.00000 days | Status: CP
Start: 2018-08-28 — End: 2018-10-11

## 2018-08-28 MED ORDER — POLYETHYLENE GLYCOL 3350 17 GRAM ORAL POWDER PACKET
PACK | Freq: Every day | ORAL | 0 refills | 0.00000 days | Status: CP
Start: 2018-08-28 — End: 2019-10-04

## 2018-08-29 ENCOUNTER — Other Ambulatory Visit: Admit: 2018-08-29 | Discharge: 2018-08-30 | Payer: MEDICAID

## 2018-08-29 DIAGNOSIS — I5032 Chronic diastolic (congestive) heart failure: Secondary | ICD-10-CM

## 2018-08-29 DIAGNOSIS — I110 Hypertensive heart disease with heart failure: Secondary | ICD-10-CM

## 2018-08-29 DIAGNOSIS — Z89421 Acquired absence of other right toe(s): Secondary | ICD-10-CM

## 2018-08-29 DIAGNOSIS — Z8631 Personal history of diabetic foot ulcer: Secondary | ICD-10-CM

## 2018-08-29 DIAGNOSIS — Z6841 Body Mass Index (BMI) 40.0 and over, adult: Secondary | ICD-10-CM

## 2018-08-29 DIAGNOSIS — J449 Chronic obstructive pulmonary disease, unspecified: Secondary | ICD-10-CM

## 2018-08-29 DIAGNOSIS — Z87891 Personal history of nicotine dependence: Secondary | ICD-10-CM

## 2018-08-29 DIAGNOSIS — N318 Other neuromuscular dysfunction of bladder: Secondary | ICD-10-CM

## 2018-08-29 DIAGNOSIS — Z602 Problems related to living alone: Secondary | ICD-10-CM

## 2018-08-29 DIAGNOSIS — F603 Borderline personality disorder: Secondary | ICD-10-CM

## 2018-08-29 DIAGNOSIS — M0579 Rheumatoid arthritis with rheumatoid factor of multiple sites without organ or systems involvement: Secondary | ICD-10-CM

## 2018-08-29 DIAGNOSIS — Z8614 Personal history of Methicillin resistant Staphylococcus aureus infection: Secondary | ICD-10-CM

## 2018-08-29 DIAGNOSIS — E1165 Type 2 diabetes mellitus with hyperglycemia: Secondary | ICD-10-CM

## 2018-08-29 DIAGNOSIS — G4733 Obstructive sleep apnea (adult) (pediatric): Secondary | ICD-10-CM

## 2018-08-29 DIAGNOSIS — F319 Bipolar disorder, unspecified: Secondary | ICD-10-CM

## 2018-08-29 DIAGNOSIS — K219 Gastro-esophageal reflux disease without esophagitis: Secondary | ICD-10-CM

## 2018-08-29 DIAGNOSIS — E039 Hypothyroidism, unspecified: Secondary | ICD-10-CM

## 2018-08-29 DIAGNOSIS — G894 Chronic pain syndrome: Secondary | ICD-10-CM

## 2018-08-29 DIAGNOSIS — M519 Unspecified thoracic, thoracolumbar and lumbosacral intervertebral disc disorder: Secondary | ICD-10-CM

## 2018-08-29 DIAGNOSIS — Z794 Long term (current) use of insulin: Secondary | ICD-10-CM

## 2018-08-29 DIAGNOSIS — Z4781 Encounter for orthopedic aftercare following surgical amputation: Principal | ICD-10-CM

## 2018-08-29 DIAGNOSIS — E1142 Type 2 diabetes mellitus with diabetic polyneuropathy: Secondary | ICD-10-CM

## 2018-08-29 DIAGNOSIS — F419 Anxiety disorder, unspecified: Secondary | ICD-10-CM

## 2018-08-29 DIAGNOSIS — F5105 Insomnia due to other mental disorder: Secondary | ICD-10-CM

## 2018-08-29 MED ORDER — SOLIFENACIN 5 MG TABLET
ORAL_TABLET | Freq: Every day | ORAL | 12 refills | 0 days | Status: CP
Start: 2018-08-29 — End: ?

## 2018-08-31 ENCOUNTER — Encounter: Admit: 2018-08-31 | Discharge: 2018-08-31 | Payer: MEDICAID | Attending: Family | Primary: Family

## 2018-08-31 DIAGNOSIS — E1165 Type 2 diabetes mellitus with hyperglycemia: Secondary | ICD-10-CM

## 2018-08-31 DIAGNOSIS — E1142 Type 2 diabetes mellitus with diabetic polyneuropathy: Secondary | ICD-10-CM

## 2018-08-31 DIAGNOSIS — Z4781 Encounter for orthopedic aftercare following surgical amputation: Principal | ICD-10-CM

## 2018-08-31 DIAGNOSIS — Z8614 Personal history of Methicillin resistant Staphylococcus aureus infection: Secondary | ICD-10-CM

## 2018-08-31 DIAGNOSIS — F319 Bipolar disorder, unspecified: Secondary | ICD-10-CM

## 2018-08-31 DIAGNOSIS — G4733 Obstructive sleep apnea (adult) (pediatric): Secondary | ICD-10-CM

## 2018-08-31 DIAGNOSIS — J449 Chronic obstructive pulmonary disease, unspecified: Secondary | ICD-10-CM

## 2018-08-31 DIAGNOSIS — F603 Borderline personality disorder: Secondary | ICD-10-CM

## 2018-08-31 DIAGNOSIS — G894 Chronic pain syndrome: Secondary | ICD-10-CM

## 2018-08-31 DIAGNOSIS — Z794 Long term (current) use of insulin: Secondary | ICD-10-CM

## 2018-08-31 DIAGNOSIS — Z89421 Acquired absence of other right toe(s): Secondary | ICD-10-CM

## 2018-08-31 DIAGNOSIS — E039 Hypothyroidism, unspecified: Secondary | ICD-10-CM

## 2018-08-31 DIAGNOSIS — Z6841 Body Mass Index (BMI) 40.0 and over, adult: Secondary | ICD-10-CM

## 2018-08-31 DIAGNOSIS — F419 Anxiety disorder, unspecified: Secondary | ICD-10-CM

## 2018-08-31 DIAGNOSIS — I5032 Chronic diastolic (congestive) heart failure: Secondary | ICD-10-CM

## 2018-08-31 DIAGNOSIS — Z87891 Personal history of nicotine dependence: Secondary | ICD-10-CM

## 2018-08-31 DIAGNOSIS — I110 Hypertensive heart disease with heart failure: Secondary | ICD-10-CM

## 2018-08-31 DIAGNOSIS — N318 Other neuromuscular dysfunction of bladder: Secondary | ICD-10-CM

## 2018-08-31 DIAGNOSIS — M0579 Rheumatoid arthritis with rheumatoid factor of multiple sites without organ or systems involvement: Secondary | ICD-10-CM

## 2018-08-31 DIAGNOSIS — F5105 Insomnia due to other mental disorder: Secondary | ICD-10-CM

## 2018-08-31 DIAGNOSIS — Z602 Problems related to living alone: Secondary | ICD-10-CM

## 2018-08-31 DIAGNOSIS — Z8631 Personal history of diabetic foot ulcer: Secondary | ICD-10-CM

## 2018-08-31 DIAGNOSIS — K219 Gastro-esophageal reflux disease without esophagitis: Secondary | ICD-10-CM

## 2018-08-31 DIAGNOSIS — M519 Unspecified thoracic, thoracolumbar and lumbosacral intervertebral disc disorder: Secondary | ICD-10-CM

## 2018-09-02 DIAGNOSIS — F319 Bipolar disorder, unspecified: Secondary | ICD-10-CM

## 2018-09-02 DIAGNOSIS — I110 Hypertensive heart disease with heart failure: Secondary | ICD-10-CM

## 2018-09-02 DIAGNOSIS — M519 Unspecified thoracic, thoracolumbar and lumbosacral intervertebral disc disorder: Secondary | ICD-10-CM

## 2018-09-02 DIAGNOSIS — F603 Borderline personality disorder: Secondary | ICD-10-CM

## 2018-09-02 DIAGNOSIS — I5032 Chronic diastolic (congestive) heart failure: Secondary | ICD-10-CM

## 2018-09-02 DIAGNOSIS — Z602 Problems related to living alone: Secondary | ICD-10-CM

## 2018-09-02 DIAGNOSIS — Z794 Long term (current) use of insulin: Secondary | ICD-10-CM

## 2018-09-02 DIAGNOSIS — F5105 Insomnia due to other mental disorder: Secondary | ICD-10-CM

## 2018-09-02 DIAGNOSIS — Z6841 Body Mass Index (BMI) 40.0 and over, adult: Secondary | ICD-10-CM

## 2018-09-02 DIAGNOSIS — Z87891 Personal history of nicotine dependence: Secondary | ICD-10-CM

## 2018-09-02 DIAGNOSIS — E1165 Type 2 diabetes mellitus with hyperglycemia: Secondary | ICD-10-CM

## 2018-09-02 DIAGNOSIS — E039 Hypothyroidism, unspecified: Secondary | ICD-10-CM

## 2018-09-02 DIAGNOSIS — F419 Anxiety disorder, unspecified: Secondary | ICD-10-CM

## 2018-09-02 DIAGNOSIS — J449 Chronic obstructive pulmonary disease, unspecified: Secondary | ICD-10-CM

## 2018-09-02 DIAGNOSIS — G4733 Obstructive sleep apnea (adult) (pediatric): Secondary | ICD-10-CM

## 2018-09-02 DIAGNOSIS — M0579 Rheumatoid arthritis with rheumatoid factor of multiple sites without organ or systems involvement: Secondary | ICD-10-CM

## 2018-09-02 DIAGNOSIS — Z8631 Personal history of diabetic foot ulcer: Secondary | ICD-10-CM

## 2018-09-02 DIAGNOSIS — N318 Other neuromuscular dysfunction of bladder: Secondary | ICD-10-CM

## 2018-09-02 DIAGNOSIS — G894 Chronic pain syndrome: Secondary | ICD-10-CM

## 2018-09-02 DIAGNOSIS — Z89421 Acquired absence of other right toe(s): Secondary | ICD-10-CM

## 2018-09-02 DIAGNOSIS — Z8614 Personal history of Methicillin resistant Staphylococcus aureus infection: Secondary | ICD-10-CM

## 2018-09-02 DIAGNOSIS — E1142 Type 2 diabetes mellitus with diabetic polyneuropathy: Secondary | ICD-10-CM

## 2018-09-02 DIAGNOSIS — K219 Gastro-esophageal reflux disease without esophagitis: Secondary | ICD-10-CM

## 2018-09-02 DIAGNOSIS — Z4781 Encounter for orthopedic aftercare following surgical amputation: Principal | ICD-10-CM

## 2018-09-04 ENCOUNTER — Other Ambulatory Visit: Payer: Self-pay | Admitting: Psychiatry

## 2018-09-04 ENCOUNTER — Encounter: Admit: 2018-09-04 | Discharge: 2018-09-04 | Payer: MEDICAID | Attending: Family | Primary: Family

## 2018-09-04 DIAGNOSIS — G4733 Obstructive sleep apnea (adult) (pediatric): Secondary | ICD-10-CM

## 2018-09-04 DIAGNOSIS — Z8631 Personal history of diabetic foot ulcer: Secondary | ICD-10-CM

## 2018-09-04 DIAGNOSIS — K219 Gastro-esophageal reflux disease without esophagitis: Secondary | ICD-10-CM

## 2018-09-04 DIAGNOSIS — E1165 Type 2 diabetes mellitus with hyperglycemia: Secondary | ICD-10-CM

## 2018-09-04 DIAGNOSIS — F419 Anxiety disorder, unspecified: Secondary | ICD-10-CM

## 2018-09-04 DIAGNOSIS — E039 Hypothyroidism, unspecified: Secondary | ICD-10-CM

## 2018-09-04 DIAGNOSIS — Z87891 Personal history of nicotine dependence: Secondary | ICD-10-CM

## 2018-09-04 DIAGNOSIS — Z602 Problems related to living alone: Secondary | ICD-10-CM

## 2018-09-04 DIAGNOSIS — Z4781 Encounter for orthopedic aftercare following surgical amputation: Principal | ICD-10-CM

## 2018-09-04 DIAGNOSIS — F319 Bipolar disorder, unspecified: Secondary | ICD-10-CM

## 2018-09-04 DIAGNOSIS — F5105 Insomnia due to other mental disorder: Secondary | ICD-10-CM

## 2018-09-04 DIAGNOSIS — F603 Borderline personality disorder: Secondary | ICD-10-CM

## 2018-09-04 DIAGNOSIS — G894 Chronic pain syndrome: Secondary | ICD-10-CM

## 2018-09-04 DIAGNOSIS — I5032 Chronic diastolic (congestive) heart failure: Secondary | ICD-10-CM

## 2018-09-04 DIAGNOSIS — M0579 Rheumatoid arthritis with rheumatoid factor of multiple sites without organ or systems involvement: Secondary | ICD-10-CM

## 2018-09-04 DIAGNOSIS — Z794 Long term (current) use of insulin: Secondary | ICD-10-CM

## 2018-09-04 DIAGNOSIS — Z8614 Personal history of Methicillin resistant Staphylococcus aureus infection: Secondary | ICD-10-CM

## 2018-09-04 DIAGNOSIS — Z6841 Body Mass Index (BMI) 40.0 and over, adult: Secondary | ICD-10-CM

## 2018-09-04 DIAGNOSIS — M519 Unspecified thoracic, thoracolumbar and lumbosacral intervertebral disc disorder: Secondary | ICD-10-CM

## 2018-09-04 DIAGNOSIS — E1142 Type 2 diabetes mellitus with diabetic polyneuropathy: Secondary | ICD-10-CM

## 2018-09-04 DIAGNOSIS — T8743 Infection of amputation stump, right lower extremity: Principal | ICD-10-CM

## 2018-09-04 DIAGNOSIS — I110 Hypertensive heart disease with heart failure: Secondary | ICD-10-CM

## 2018-09-04 DIAGNOSIS — Z89421 Acquired absence of other right toe(s): Secondary | ICD-10-CM

## 2018-09-04 DIAGNOSIS — N318 Other neuromuscular dysfunction of bladder: Secondary | ICD-10-CM

## 2018-09-04 DIAGNOSIS — J449 Chronic obstructive pulmonary disease, unspecified: Secondary | ICD-10-CM

## 2018-09-05 DIAGNOSIS — G894 Chronic pain syndrome: Secondary | ICD-10-CM

## 2018-09-05 DIAGNOSIS — E039 Hypothyroidism, unspecified: Secondary | ICD-10-CM

## 2018-09-05 DIAGNOSIS — Z6841 Body Mass Index (BMI) 40.0 and over, adult: Secondary | ICD-10-CM

## 2018-09-05 DIAGNOSIS — J449 Chronic obstructive pulmonary disease, unspecified: Secondary | ICD-10-CM

## 2018-09-05 DIAGNOSIS — N318 Other neuromuscular dysfunction of bladder: Secondary | ICD-10-CM

## 2018-09-05 DIAGNOSIS — K219 Gastro-esophageal reflux disease without esophagitis: Secondary | ICD-10-CM

## 2018-09-05 DIAGNOSIS — Z89421 Acquired absence of other right toe(s): Secondary | ICD-10-CM

## 2018-09-05 DIAGNOSIS — M0579 Rheumatoid arthritis with rheumatoid factor of multiple sites without organ or systems involvement: Secondary | ICD-10-CM

## 2018-09-05 DIAGNOSIS — Z794 Long term (current) use of insulin: Secondary | ICD-10-CM

## 2018-09-05 DIAGNOSIS — I5032 Chronic diastolic (congestive) heart failure: Secondary | ICD-10-CM

## 2018-09-05 DIAGNOSIS — F419 Anxiety disorder, unspecified: Secondary | ICD-10-CM

## 2018-09-05 DIAGNOSIS — Z602 Problems related to living alone: Secondary | ICD-10-CM

## 2018-09-05 DIAGNOSIS — G4733 Obstructive sleep apnea (adult) (pediatric): Secondary | ICD-10-CM

## 2018-09-05 DIAGNOSIS — F603 Borderline personality disorder: Secondary | ICD-10-CM

## 2018-09-05 DIAGNOSIS — F319 Bipolar disorder, unspecified: Secondary | ICD-10-CM

## 2018-09-05 DIAGNOSIS — Z8631 Personal history of diabetic foot ulcer: Secondary | ICD-10-CM

## 2018-09-05 DIAGNOSIS — F5105 Insomnia due to other mental disorder: Secondary | ICD-10-CM

## 2018-09-05 DIAGNOSIS — Z8614 Personal history of Methicillin resistant Staphylococcus aureus infection: Secondary | ICD-10-CM

## 2018-09-05 DIAGNOSIS — E1142 Type 2 diabetes mellitus with diabetic polyneuropathy: Secondary | ICD-10-CM

## 2018-09-05 DIAGNOSIS — Z4781 Encounter for orthopedic aftercare following surgical amputation: Principal | ICD-10-CM

## 2018-09-05 DIAGNOSIS — Z87891 Personal history of nicotine dependence: Secondary | ICD-10-CM

## 2018-09-05 DIAGNOSIS — I110 Hypertensive heart disease with heart failure: Secondary | ICD-10-CM

## 2018-09-05 DIAGNOSIS — E1165 Type 2 diabetes mellitus with hyperglycemia: Secondary | ICD-10-CM

## 2018-09-05 DIAGNOSIS — M519 Unspecified thoracic, thoracolumbar and lumbosacral intervertebral disc disorder: Secondary | ICD-10-CM

## 2018-09-07 ENCOUNTER — Other Ambulatory Visit: Admit: 2018-09-07 | Discharge: 2018-09-08 | Payer: MEDICAID

## 2018-09-07 DIAGNOSIS — N318 Other neuromuscular dysfunction of bladder: Secondary | ICD-10-CM

## 2018-09-07 DIAGNOSIS — G4733 Obstructive sleep apnea (adult) (pediatric): Secondary | ICD-10-CM

## 2018-09-07 DIAGNOSIS — Z8614 Personal history of Methicillin resistant Staphylococcus aureus infection: Secondary | ICD-10-CM

## 2018-09-07 DIAGNOSIS — I5032 Chronic diastolic (congestive) heart failure: Secondary | ICD-10-CM

## 2018-09-07 DIAGNOSIS — F5105 Insomnia due to other mental disorder: Secondary | ICD-10-CM

## 2018-09-07 DIAGNOSIS — F603 Borderline personality disorder: Secondary | ICD-10-CM

## 2018-09-07 DIAGNOSIS — Z89421 Acquired absence of other right toe(s): Secondary | ICD-10-CM

## 2018-09-07 DIAGNOSIS — G894 Chronic pain syndrome: Secondary | ICD-10-CM

## 2018-09-07 DIAGNOSIS — E1142 Type 2 diabetes mellitus with diabetic polyneuropathy: Secondary | ICD-10-CM

## 2018-09-07 DIAGNOSIS — E1165 Type 2 diabetes mellitus with hyperglycemia: Secondary | ICD-10-CM

## 2018-09-07 DIAGNOSIS — M519 Unspecified thoracic, thoracolumbar and lumbosacral intervertebral disc disorder: Secondary | ICD-10-CM

## 2018-09-07 DIAGNOSIS — Z4781 Encounter for orthopedic aftercare following surgical amputation: Principal | ICD-10-CM

## 2018-09-07 DIAGNOSIS — K219 Gastro-esophageal reflux disease without esophagitis: Secondary | ICD-10-CM

## 2018-09-07 DIAGNOSIS — Z87891 Personal history of nicotine dependence: Secondary | ICD-10-CM

## 2018-09-07 DIAGNOSIS — I110 Hypertensive heart disease with heart failure: Secondary | ICD-10-CM

## 2018-09-07 DIAGNOSIS — F419 Anxiety disorder, unspecified: Secondary | ICD-10-CM

## 2018-09-07 DIAGNOSIS — E039 Hypothyroidism, unspecified: Secondary | ICD-10-CM

## 2018-09-07 DIAGNOSIS — F319 Bipolar disorder, unspecified: Secondary | ICD-10-CM

## 2018-09-07 DIAGNOSIS — Z794 Long term (current) use of insulin: Secondary | ICD-10-CM

## 2018-09-07 DIAGNOSIS — Z602 Problems related to living alone: Secondary | ICD-10-CM

## 2018-09-07 DIAGNOSIS — Z6841 Body Mass Index (BMI) 40.0 and over, adult: Secondary | ICD-10-CM

## 2018-09-07 DIAGNOSIS — T8143XA Infection following a procedure, organ and space surgical site, initial encounter: Principal | ICD-10-CM

## 2018-09-07 DIAGNOSIS — J449 Chronic obstructive pulmonary disease, unspecified: Secondary | ICD-10-CM

## 2018-09-07 DIAGNOSIS — M0579 Rheumatoid arthritis with rheumatoid factor of multiple sites without organ or systems involvement: Secondary | ICD-10-CM

## 2018-09-07 DIAGNOSIS — Z8631 Personal history of diabetic foot ulcer: Secondary | ICD-10-CM

## 2018-09-08 DIAGNOSIS — Z89421 Acquired absence of other right toe(s): Secondary | ICD-10-CM

## 2018-09-08 DIAGNOSIS — Z8631 Personal history of diabetic foot ulcer: Secondary | ICD-10-CM

## 2018-09-08 DIAGNOSIS — F603 Borderline personality disorder: Secondary | ICD-10-CM

## 2018-09-08 DIAGNOSIS — M519 Unspecified thoracic, thoracolumbar and lumbosacral intervertebral disc disorder: Secondary | ICD-10-CM

## 2018-09-08 DIAGNOSIS — J449 Chronic obstructive pulmonary disease, unspecified: Secondary | ICD-10-CM

## 2018-09-08 DIAGNOSIS — Z8614 Personal history of Methicillin resistant Staphylococcus aureus infection: Secondary | ICD-10-CM

## 2018-09-08 DIAGNOSIS — Z794 Long term (current) use of insulin: Secondary | ICD-10-CM

## 2018-09-08 DIAGNOSIS — M0579 Rheumatoid arthritis with rheumatoid factor of multiple sites without organ or systems involvement: Secondary | ICD-10-CM

## 2018-09-08 DIAGNOSIS — Z6841 Body Mass Index (BMI) 40.0 and over, adult: Secondary | ICD-10-CM

## 2018-09-08 DIAGNOSIS — G4733 Obstructive sleep apnea (adult) (pediatric): Secondary | ICD-10-CM

## 2018-09-08 DIAGNOSIS — E1165 Type 2 diabetes mellitus with hyperglycemia: Secondary | ICD-10-CM

## 2018-09-08 DIAGNOSIS — I5032 Chronic diastolic (congestive) heart failure: Secondary | ICD-10-CM

## 2018-09-08 DIAGNOSIS — I110 Hypertensive heart disease with heart failure: Secondary | ICD-10-CM

## 2018-09-08 DIAGNOSIS — Z87891 Personal history of nicotine dependence: Secondary | ICD-10-CM

## 2018-09-08 DIAGNOSIS — Z4781 Encounter for orthopedic aftercare following surgical amputation: Principal | ICD-10-CM

## 2018-09-08 DIAGNOSIS — E039 Hypothyroidism, unspecified: Secondary | ICD-10-CM

## 2018-09-08 DIAGNOSIS — E1142 Type 2 diabetes mellitus with diabetic polyneuropathy: Secondary | ICD-10-CM

## 2018-09-08 DIAGNOSIS — F5105 Insomnia due to other mental disorder: Secondary | ICD-10-CM

## 2018-09-08 DIAGNOSIS — F319 Bipolar disorder, unspecified: Secondary | ICD-10-CM

## 2018-09-08 DIAGNOSIS — N318 Other neuromuscular dysfunction of bladder: Secondary | ICD-10-CM

## 2018-09-08 DIAGNOSIS — F419 Anxiety disorder, unspecified: Secondary | ICD-10-CM

## 2018-09-08 DIAGNOSIS — K219 Gastro-esophageal reflux disease without esophagitis: Secondary | ICD-10-CM

## 2018-09-08 DIAGNOSIS — G894 Chronic pain syndrome: Secondary | ICD-10-CM

## 2018-09-08 DIAGNOSIS — Z602 Problems related to living alone: Secondary | ICD-10-CM

## 2018-09-11 DIAGNOSIS — Z794 Long term (current) use of insulin: Secondary | ICD-10-CM

## 2018-09-11 DIAGNOSIS — G4733 Obstructive sleep apnea (adult) (pediatric): Secondary | ICD-10-CM

## 2018-09-11 DIAGNOSIS — I110 Hypertensive heart disease with heart failure: Secondary | ICD-10-CM

## 2018-09-11 DIAGNOSIS — E1165 Type 2 diabetes mellitus with hyperglycemia: Secondary | ICD-10-CM

## 2018-09-11 DIAGNOSIS — Z4781 Encounter for orthopedic aftercare following surgical amputation: Principal | ICD-10-CM

## 2018-09-11 DIAGNOSIS — G894 Chronic pain syndrome: Secondary | ICD-10-CM

## 2018-09-11 DIAGNOSIS — F319 Bipolar disorder, unspecified: Secondary | ICD-10-CM

## 2018-09-11 DIAGNOSIS — M0579 Rheumatoid arthritis with rheumatoid factor of multiple sites without organ or systems involvement: Secondary | ICD-10-CM

## 2018-09-11 DIAGNOSIS — F419 Anxiety disorder, unspecified: Secondary | ICD-10-CM

## 2018-09-11 DIAGNOSIS — Z87891 Personal history of nicotine dependence: Secondary | ICD-10-CM

## 2018-09-11 DIAGNOSIS — F603 Borderline personality disorder: Secondary | ICD-10-CM

## 2018-09-11 DIAGNOSIS — E039 Hypothyroidism, unspecified: Secondary | ICD-10-CM

## 2018-09-11 DIAGNOSIS — Z8614 Personal history of Methicillin resistant Staphylococcus aureus infection: Secondary | ICD-10-CM

## 2018-09-11 DIAGNOSIS — K219 Gastro-esophageal reflux disease without esophagitis: Secondary | ICD-10-CM

## 2018-09-11 DIAGNOSIS — E1142 Type 2 diabetes mellitus with diabetic polyneuropathy: Secondary | ICD-10-CM

## 2018-09-11 DIAGNOSIS — Z6841 Body Mass Index (BMI) 40.0 and over, adult: Secondary | ICD-10-CM

## 2018-09-11 DIAGNOSIS — Z8631 Personal history of diabetic foot ulcer: Secondary | ICD-10-CM

## 2018-09-11 DIAGNOSIS — N318 Other neuromuscular dysfunction of bladder: Secondary | ICD-10-CM

## 2018-09-11 DIAGNOSIS — F5105 Insomnia due to other mental disorder: Secondary | ICD-10-CM

## 2018-09-11 DIAGNOSIS — Z89421 Acquired absence of other right toe(s): Secondary | ICD-10-CM

## 2018-09-11 DIAGNOSIS — I5032 Chronic diastolic (congestive) heart failure: Secondary | ICD-10-CM

## 2018-09-11 DIAGNOSIS — J449 Chronic obstructive pulmonary disease, unspecified: Secondary | ICD-10-CM

## 2018-09-11 DIAGNOSIS — M519 Unspecified thoracic, thoracolumbar and lumbosacral intervertebral disc disorder: Secondary | ICD-10-CM

## 2018-09-11 DIAGNOSIS — Z602 Problems related to living alone: Secondary | ICD-10-CM

## 2018-09-12 ENCOUNTER — Encounter: Admit: 2018-09-12 | Discharge: 2018-09-12 | Payer: MEDICAID

## 2018-09-12 DIAGNOSIS — E1142 Type 2 diabetes mellitus with diabetic polyneuropathy: Secondary | ICD-10-CM

## 2018-09-12 DIAGNOSIS — N318 Other neuromuscular dysfunction of bladder: Secondary | ICD-10-CM

## 2018-09-12 DIAGNOSIS — Z4781 Encounter for orthopedic aftercare following surgical amputation: Principal | ICD-10-CM

## 2018-09-12 DIAGNOSIS — Z87891 Personal history of nicotine dependence: Secondary | ICD-10-CM

## 2018-09-12 DIAGNOSIS — M519 Unspecified thoracic, thoracolumbar and lumbosacral intervertebral disc disorder: Secondary | ICD-10-CM

## 2018-09-12 DIAGNOSIS — K219 Gastro-esophageal reflux disease without esophagitis: Secondary | ICD-10-CM

## 2018-09-12 DIAGNOSIS — T8743 Infection of amputation stump, right lower extremity: Principal | ICD-10-CM

## 2018-09-12 DIAGNOSIS — E039 Hypothyroidism, unspecified: Secondary | ICD-10-CM

## 2018-09-12 DIAGNOSIS — F5105 Insomnia due to other mental disorder: Secondary | ICD-10-CM

## 2018-09-12 DIAGNOSIS — Z8631 Personal history of diabetic foot ulcer: Secondary | ICD-10-CM

## 2018-09-12 DIAGNOSIS — G894 Chronic pain syndrome: Secondary | ICD-10-CM

## 2018-09-12 DIAGNOSIS — I5032 Chronic diastolic (congestive) heart failure: Secondary | ICD-10-CM

## 2018-09-12 DIAGNOSIS — M0579 Rheumatoid arthritis with rheumatoid factor of multiple sites without organ or systems involvement: Secondary | ICD-10-CM

## 2018-09-12 DIAGNOSIS — F419 Anxiety disorder, unspecified: Secondary | ICD-10-CM

## 2018-09-12 DIAGNOSIS — E1165 Type 2 diabetes mellitus with hyperglycemia: Secondary | ICD-10-CM

## 2018-09-12 DIAGNOSIS — Z794 Long term (current) use of insulin: Secondary | ICD-10-CM

## 2018-09-12 DIAGNOSIS — Z602 Problems related to living alone: Secondary | ICD-10-CM

## 2018-09-12 DIAGNOSIS — G4733 Obstructive sleep apnea (adult) (pediatric): Secondary | ICD-10-CM

## 2018-09-12 DIAGNOSIS — F319 Bipolar disorder, unspecified: Secondary | ICD-10-CM

## 2018-09-12 DIAGNOSIS — Z8614 Personal history of Methicillin resistant Staphylococcus aureus infection: Secondary | ICD-10-CM

## 2018-09-12 DIAGNOSIS — Z89421 Acquired absence of other right toe(s): Secondary | ICD-10-CM

## 2018-09-12 DIAGNOSIS — I110 Hypertensive heart disease with heart failure: Secondary | ICD-10-CM

## 2018-09-12 DIAGNOSIS — F603 Borderline personality disorder: Secondary | ICD-10-CM

## 2018-09-12 DIAGNOSIS — J449 Chronic obstructive pulmonary disease, unspecified: Secondary | ICD-10-CM

## 2018-09-12 DIAGNOSIS — Z6841 Body Mass Index (BMI) 40.0 and over, adult: Secondary | ICD-10-CM

## 2018-09-14 DIAGNOSIS — Z602 Problems related to living alone: Secondary | ICD-10-CM

## 2018-09-14 DIAGNOSIS — G894 Chronic pain syndrome: Secondary | ICD-10-CM

## 2018-09-14 DIAGNOSIS — Z794 Long term (current) use of insulin: Secondary | ICD-10-CM

## 2018-09-14 DIAGNOSIS — G4733 Obstructive sleep apnea (adult) (pediatric): Secondary | ICD-10-CM

## 2018-09-14 DIAGNOSIS — I110 Hypertensive heart disease with heart failure: Secondary | ICD-10-CM

## 2018-09-14 DIAGNOSIS — E039 Hypothyroidism, unspecified: Secondary | ICD-10-CM

## 2018-09-14 DIAGNOSIS — K219 Gastro-esophageal reflux disease without esophagitis: Secondary | ICD-10-CM

## 2018-09-14 DIAGNOSIS — N318 Other neuromuscular dysfunction of bladder: Secondary | ICD-10-CM

## 2018-09-14 DIAGNOSIS — F603 Borderline personality disorder: Secondary | ICD-10-CM

## 2018-09-14 DIAGNOSIS — Z89421 Acquired absence of other right toe(s): Secondary | ICD-10-CM

## 2018-09-14 DIAGNOSIS — F419 Anxiety disorder, unspecified: Secondary | ICD-10-CM

## 2018-09-14 DIAGNOSIS — E1165 Type 2 diabetes mellitus with hyperglycemia: Secondary | ICD-10-CM

## 2018-09-14 DIAGNOSIS — M519 Unspecified thoracic, thoracolumbar and lumbosacral intervertebral disc disorder: Secondary | ICD-10-CM

## 2018-09-14 DIAGNOSIS — M0579 Rheumatoid arthritis with rheumatoid factor of multiple sites without organ or systems involvement: Secondary | ICD-10-CM

## 2018-09-14 DIAGNOSIS — Z87891 Personal history of nicotine dependence: Secondary | ICD-10-CM

## 2018-09-14 DIAGNOSIS — Z8614 Personal history of Methicillin resistant Staphylococcus aureus infection: Secondary | ICD-10-CM

## 2018-09-14 DIAGNOSIS — E1142 Type 2 diabetes mellitus with diabetic polyneuropathy: Secondary | ICD-10-CM

## 2018-09-14 DIAGNOSIS — Z8631 Personal history of diabetic foot ulcer: Secondary | ICD-10-CM

## 2018-09-14 DIAGNOSIS — F319 Bipolar disorder, unspecified: Secondary | ICD-10-CM

## 2018-09-14 DIAGNOSIS — Z4781 Encounter for orthopedic aftercare following surgical amputation: Principal | ICD-10-CM

## 2018-09-14 DIAGNOSIS — I5032 Chronic diastolic (congestive) heart failure: Secondary | ICD-10-CM

## 2018-09-14 DIAGNOSIS — Z6841 Body Mass Index (BMI) 40.0 and over, adult: Secondary | ICD-10-CM

## 2018-09-14 DIAGNOSIS — F5105 Insomnia due to other mental disorder: Secondary | ICD-10-CM

## 2018-09-14 DIAGNOSIS — J449 Chronic obstructive pulmonary disease, unspecified: Secondary | ICD-10-CM

## 2018-09-15 ENCOUNTER — Encounter: Admit: 2018-09-15 | Discharge: 2018-10-09 | Payer: MEDICAID

## 2018-09-18 ENCOUNTER — Institutional Professional Consult (permissible substitution): Admit: 2018-09-18 | Discharge: 2018-09-18 | Payer: MEDICAID | Attending: Rheumatology | Primary: Rheumatology

## 2018-09-18 ENCOUNTER — Other Ambulatory Visit: Admit: 2018-09-18 | Discharge: 2018-09-18 | Payer: MEDICAID

## 2018-09-18 DIAGNOSIS — F5105 Insomnia due to other mental disorder: Secondary | ICD-10-CM

## 2018-09-18 DIAGNOSIS — F319 Bipolar disorder, unspecified: Secondary | ICD-10-CM

## 2018-09-18 DIAGNOSIS — E1169 Type 2 diabetes mellitus with other specified complication: Secondary | ICD-10-CM

## 2018-09-18 DIAGNOSIS — E1165 Type 2 diabetes mellitus with hyperglycemia: Secondary | ICD-10-CM

## 2018-09-18 DIAGNOSIS — G894 Chronic pain syndrome: Secondary | ICD-10-CM

## 2018-09-18 DIAGNOSIS — Z6841 Body Mass Index (BMI) 40.0 and over, adult: Secondary | ICD-10-CM

## 2018-09-18 DIAGNOSIS — F603 Borderline personality disorder: Secondary | ICD-10-CM

## 2018-09-18 DIAGNOSIS — I110 Hypertensive heart disease with heart failure: Secondary | ICD-10-CM

## 2018-09-18 DIAGNOSIS — I5032 Chronic diastolic (congestive) heart failure: Secondary | ICD-10-CM

## 2018-09-18 DIAGNOSIS — N318 Other neuromuscular dysfunction of bladder: Secondary | ICD-10-CM

## 2018-09-18 DIAGNOSIS — Z794 Long term (current) use of insulin: Secondary | ICD-10-CM

## 2018-09-18 DIAGNOSIS — M0579 Rheumatoid arthritis with rheumatoid factor of multiple sites without organ or systems involvement: Secondary | ICD-10-CM

## 2018-09-18 DIAGNOSIS — E039 Hypothyroidism, unspecified: Secondary | ICD-10-CM

## 2018-09-18 DIAGNOSIS — M069 Rheumatoid arthritis, unspecified: Principal | ICD-10-CM

## 2018-09-18 DIAGNOSIS — T8743 Infection of amputation stump, right lower extremity: Principal | ICD-10-CM

## 2018-09-18 DIAGNOSIS — M869 Osteomyelitis, unspecified: Secondary | ICD-10-CM

## 2018-09-18 DIAGNOSIS — F419 Anxiety disorder, unspecified: Secondary | ICD-10-CM

## 2018-09-18 DIAGNOSIS — G4733 Obstructive sleep apnea (adult) (pediatric): Secondary | ICD-10-CM

## 2018-09-18 DIAGNOSIS — L03115 Cellulitis of right lower limb: Secondary | ICD-10-CM

## 2018-09-18 DIAGNOSIS — E1142 Type 2 diabetes mellitus with diabetic polyneuropathy: Secondary | ICD-10-CM

## 2018-09-18 DIAGNOSIS — K219 Gastro-esophageal reflux disease without esophagitis: Secondary | ICD-10-CM

## 2018-09-18 DIAGNOSIS — Z87891 Personal history of nicotine dependence: Secondary | ICD-10-CM

## 2018-09-18 DIAGNOSIS — M519 Unspecified thoracic, thoracolumbar and lumbosacral intervertebral disc disorder: Secondary | ICD-10-CM

## 2018-09-18 DIAGNOSIS — J449 Chronic obstructive pulmonary disease, unspecified: Secondary | ICD-10-CM

## 2018-09-18 DIAGNOSIS — T380X5D Adverse effect of glucocorticoids and synthetic analogues, subsequent encounter: Secondary | ICD-10-CM

## 2018-09-20 ENCOUNTER — Ambulatory Visit
Admit: 2018-09-20 | Discharge: 2018-09-21 | Payer: MEDICAID | Attending: Foot & Ankle Surgery | Primary: Foot & Ankle Surgery

## 2018-09-20 ENCOUNTER — Ambulatory Visit: Admit: 2018-09-20 | Discharge: 2018-09-21 | Payer: MEDICAID

## 2018-09-20 DIAGNOSIS — Z87891 Personal history of nicotine dependence: Secondary | ICD-10-CM

## 2018-09-20 DIAGNOSIS — E1165 Type 2 diabetes mellitus with hyperglycemia: Secondary | ICD-10-CM

## 2018-09-20 DIAGNOSIS — L03115 Cellulitis of right lower limb: Secondary | ICD-10-CM

## 2018-09-20 DIAGNOSIS — G4733 Obstructive sleep apnea (adult) (pediatric): Secondary | ICD-10-CM

## 2018-09-20 DIAGNOSIS — Z6841 Body Mass Index (BMI) 40.0 and over, adult: Secondary | ICD-10-CM

## 2018-09-20 DIAGNOSIS — L97412 Non-pressure chronic ulcer of right heel and midfoot with fat layer exposed: Secondary | ICD-10-CM

## 2018-09-20 DIAGNOSIS — E039 Hypothyroidism, unspecified: Secondary | ICD-10-CM

## 2018-09-20 DIAGNOSIS — M0579 Rheumatoid arthritis with rheumatoid factor of multiple sites without organ or systems involvement: Secondary | ICD-10-CM

## 2018-09-20 DIAGNOSIS — S98131A Complete traumatic amputation of one right lesser toe, initial encounter: Secondary | ICD-10-CM

## 2018-09-20 DIAGNOSIS — I110 Hypertensive heart disease with heart failure: Secondary | ICD-10-CM

## 2018-09-20 DIAGNOSIS — M519 Unspecified thoracic, thoracolumbar and lumbosacral intervertebral disc disorder: Secondary | ICD-10-CM

## 2018-09-20 DIAGNOSIS — F319 Bipolar disorder, unspecified: Secondary | ICD-10-CM

## 2018-09-20 DIAGNOSIS — F5105 Insomnia due to other mental disorder: Secondary | ICD-10-CM

## 2018-09-20 DIAGNOSIS — E11621 Type 2 diabetes mellitus with foot ulcer: Principal | ICD-10-CM

## 2018-09-20 DIAGNOSIS — F603 Borderline personality disorder: Secondary | ICD-10-CM

## 2018-09-20 DIAGNOSIS — E1169 Type 2 diabetes mellitus with other specified complication: Secondary | ICD-10-CM

## 2018-09-20 DIAGNOSIS — T8743 Infection of amputation stump, right lower extremity: Principal | ICD-10-CM

## 2018-09-20 DIAGNOSIS — F419 Anxiety disorder, unspecified: Secondary | ICD-10-CM

## 2018-09-20 DIAGNOSIS — N318 Other neuromuscular dysfunction of bladder: Secondary | ICD-10-CM

## 2018-09-20 DIAGNOSIS — J449 Chronic obstructive pulmonary disease, unspecified: Secondary | ICD-10-CM

## 2018-09-20 DIAGNOSIS — G894 Chronic pain syndrome: Secondary | ICD-10-CM

## 2018-09-20 DIAGNOSIS — E1142 Type 2 diabetes mellitus with diabetic polyneuropathy: Secondary | ICD-10-CM

## 2018-09-20 DIAGNOSIS — Z89419 Acquired absence of unspecified great toe: Secondary | ICD-10-CM

## 2018-09-20 DIAGNOSIS — T380X5D Adverse effect of glucocorticoids and synthetic analogues, subsequent encounter: Secondary | ICD-10-CM

## 2018-09-20 DIAGNOSIS — K219 Gastro-esophageal reflux disease without esophagitis: Secondary | ICD-10-CM

## 2018-09-20 DIAGNOSIS — Z794 Long term (current) use of insulin: Secondary | ICD-10-CM

## 2018-09-20 DIAGNOSIS — I5032 Chronic diastolic (congestive) heart failure: Secondary | ICD-10-CM

## 2018-09-20 DIAGNOSIS — M869 Osteomyelitis, unspecified: Secondary | ICD-10-CM

## 2018-09-21 ENCOUNTER — Encounter: Admit: 2018-09-21 | Discharge: 2018-09-21 | Payer: MEDICAID

## 2018-09-21 DIAGNOSIS — Z794 Long term (current) use of insulin: Secondary | ICD-10-CM

## 2018-09-21 DIAGNOSIS — E039 Hypothyroidism, unspecified: Secondary | ICD-10-CM

## 2018-09-21 DIAGNOSIS — M869 Osteomyelitis, unspecified: Secondary | ICD-10-CM

## 2018-09-21 DIAGNOSIS — M0579 Rheumatoid arthritis with rheumatoid factor of multiple sites without organ or systems involvement: Secondary | ICD-10-CM

## 2018-09-21 DIAGNOSIS — N318 Other neuromuscular dysfunction of bladder: Secondary | ICD-10-CM

## 2018-09-21 DIAGNOSIS — K219 Gastro-esophageal reflux disease without esophagitis: Secondary | ICD-10-CM

## 2018-09-21 DIAGNOSIS — E1165 Type 2 diabetes mellitus with hyperglycemia: Secondary | ICD-10-CM

## 2018-09-21 DIAGNOSIS — E1169 Type 2 diabetes mellitus with other specified complication: Secondary | ICD-10-CM

## 2018-09-21 DIAGNOSIS — I110 Hypertensive heart disease with heart failure: Secondary | ICD-10-CM

## 2018-09-21 DIAGNOSIS — T380X5D Adverse effect of glucocorticoids and synthetic analogues, subsequent encounter: Secondary | ICD-10-CM

## 2018-09-21 DIAGNOSIS — E1142 Type 2 diabetes mellitus with diabetic polyneuropathy: Secondary | ICD-10-CM

## 2018-09-21 DIAGNOSIS — L03115 Cellulitis of right lower limb: Secondary | ICD-10-CM

## 2018-09-21 DIAGNOSIS — Z87891 Personal history of nicotine dependence: Secondary | ICD-10-CM

## 2018-09-21 DIAGNOSIS — F5105 Insomnia due to other mental disorder: Secondary | ICD-10-CM

## 2018-09-21 DIAGNOSIS — G4733 Obstructive sleep apnea (adult) (pediatric): Secondary | ICD-10-CM

## 2018-09-21 DIAGNOSIS — Z6841 Body Mass Index (BMI) 40.0 and over, adult: Secondary | ICD-10-CM

## 2018-09-21 DIAGNOSIS — I5032 Chronic diastolic (congestive) heart failure: Secondary | ICD-10-CM

## 2018-09-21 DIAGNOSIS — M519 Unspecified thoracic, thoracolumbar and lumbosacral intervertebral disc disorder: Secondary | ICD-10-CM

## 2018-09-21 DIAGNOSIS — F603 Borderline personality disorder: Secondary | ICD-10-CM

## 2018-09-21 DIAGNOSIS — T8743 Infection of amputation stump, right lower extremity: Principal | ICD-10-CM

## 2018-09-21 DIAGNOSIS — G894 Chronic pain syndrome: Secondary | ICD-10-CM

## 2018-09-21 DIAGNOSIS — F419 Anxiety disorder, unspecified: Secondary | ICD-10-CM

## 2018-09-21 DIAGNOSIS — F319 Bipolar disorder, unspecified: Secondary | ICD-10-CM

## 2018-09-21 DIAGNOSIS — J449 Chronic obstructive pulmonary disease, unspecified: Secondary | ICD-10-CM

## 2018-09-25 ENCOUNTER — Ambulatory Visit: Admit: 2018-09-25 | Discharge: 2018-09-26 | Payer: MEDICAID

## 2018-09-25 DIAGNOSIS — M869 Osteomyelitis, unspecified: Principal | ICD-10-CM

## 2018-09-26 ENCOUNTER — Other Ambulatory Visit: Admit: 2018-09-26 | Discharge: 2018-09-27 | Payer: MEDICAID

## 2018-09-26 DIAGNOSIS — M0579 Rheumatoid arthritis with rheumatoid factor of multiple sites without organ or systems involvement: Secondary | ICD-10-CM

## 2018-09-26 DIAGNOSIS — J449 Chronic obstructive pulmonary disease, unspecified: Secondary | ICD-10-CM

## 2018-09-26 DIAGNOSIS — E1165 Type 2 diabetes mellitus with hyperglycemia: Secondary | ICD-10-CM

## 2018-09-26 DIAGNOSIS — E1169 Type 2 diabetes mellitus with other specified complication: Secondary | ICD-10-CM

## 2018-09-26 DIAGNOSIS — T8743 Infection of amputation stump, right lower extremity: Principal | ICD-10-CM

## 2018-09-26 DIAGNOSIS — E039 Hypothyroidism, unspecified: Secondary | ICD-10-CM

## 2018-09-26 DIAGNOSIS — F603 Borderline personality disorder: Secondary | ICD-10-CM

## 2018-09-26 DIAGNOSIS — Z6841 Body Mass Index (BMI) 40.0 and over, adult: Secondary | ICD-10-CM

## 2018-09-26 DIAGNOSIS — M519 Unspecified thoracic, thoracolumbar and lumbosacral intervertebral disc disorder: Secondary | ICD-10-CM

## 2018-09-26 DIAGNOSIS — M869 Osteomyelitis, unspecified: Secondary | ICD-10-CM

## 2018-09-26 DIAGNOSIS — N318 Other neuromuscular dysfunction of bladder: Secondary | ICD-10-CM

## 2018-09-26 DIAGNOSIS — I110 Hypertensive heart disease with heart failure: Secondary | ICD-10-CM

## 2018-09-26 DIAGNOSIS — I5032 Chronic diastolic (congestive) heart failure: Secondary | ICD-10-CM

## 2018-09-26 DIAGNOSIS — T380X5D Adverse effect of glucocorticoids and synthetic analogues, subsequent encounter: Secondary | ICD-10-CM

## 2018-09-26 DIAGNOSIS — G4733 Obstructive sleep apnea (adult) (pediatric): Secondary | ICD-10-CM

## 2018-09-26 DIAGNOSIS — G894 Chronic pain syndrome: Secondary | ICD-10-CM

## 2018-09-26 DIAGNOSIS — Z794 Long term (current) use of insulin: Secondary | ICD-10-CM

## 2018-09-26 DIAGNOSIS — K219 Gastro-esophageal reflux disease without esophagitis: Secondary | ICD-10-CM

## 2018-09-26 DIAGNOSIS — F5105 Insomnia due to other mental disorder: Secondary | ICD-10-CM

## 2018-09-26 DIAGNOSIS — F319 Bipolar disorder, unspecified: Secondary | ICD-10-CM

## 2018-09-26 DIAGNOSIS — L03115 Cellulitis of right lower limb: Secondary | ICD-10-CM

## 2018-09-26 DIAGNOSIS — E1142 Type 2 diabetes mellitus with diabetic polyneuropathy: Secondary | ICD-10-CM

## 2018-09-26 DIAGNOSIS — F419 Anxiety disorder, unspecified: Secondary | ICD-10-CM

## 2018-09-26 DIAGNOSIS — Z87891 Personal history of nicotine dependence: Secondary | ICD-10-CM

## 2018-09-28 DIAGNOSIS — E039 Hypothyroidism, unspecified: Secondary | ICD-10-CM

## 2018-09-28 DIAGNOSIS — F603 Borderline personality disorder: Secondary | ICD-10-CM

## 2018-09-28 DIAGNOSIS — I5032 Chronic diastolic (congestive) heart failure: Secondary | ICD-10-CM

## 2018-09-28 DIAGNOSIS — Z6841 Body Mass Index (BMI) 40.0 and over, adult: Secondary | ICD-10-CM

## 2018-09-28 DIAGNOSIS — F319 Bipolar disorder, unspecified: Secondary | ICD-10-CM

## 2018-09-28 DIAGNOSIS — Z87891 Personal history of nicotine dependence: Secondary | ICD-10-CM

## 2018-09-28 DIAGNOSIS — F5105 Insomnia due to other mental disorder: Secondary | ICD-10-CM

## 2018-09-28 DIAGNOSIS — G4733 Obstructive sleep apnea (adult) (pediatric): Secondary | ICD-10-CM

## 2018-09-28 DIAGNOSIS — T380X5D Adverse effect of glucocorticoids and synthetic analogues, subsequent encounter: Secondary | ICD-10-CM

## 2018-09-28 DIAGNOSIS — E1169 Type 2 diabetes mellitus with other specified complication: Secondary | ICD-10-CM

## 2018-09-28 DIAGNOSIS — K219 Gastro-esophageal reflux disease without esophagitis: Secondary | ICD-10-CM

## 2018-09-28 DIAGNOSIS — Z794 Long term (current) use of insulin: Secondary | ICD-10-CM

## 2018-09-28 DIAGNOSIS — M0579 Rheumatoid arthritis with rheumatoid factor of multiple sites without organ or systems involvement: Secondary | ICD-10-CM

## 2018-09-28 DIAGNOSIS — E1165 Type 2 diabetes mellitus with hyperglycemia: Secondary | ICD-10-CM

## 2018-09-28 DIAGNOSIS — G894 Chronic pain syndrome: Secondary | ICD-10-CM

## 2018-09-28 DIAGNOSIS — T8743 Infection of amputation stump, right lower extremity: Principal | ICD-10-CM

## 2018-09-28 DIAGNOSIS — J449 Chronic obstructive pulmonary disease, unspecified: Secondary | ICD-10-CM

## 2018-09-28 DIAGNOSIS — L03115 Cellulitis of right lower limb: Secondary | ICD-10-CM

## 2018-09-28 DIAGNOSIS — F419 Anxiety disorder, unspecified: Secondary | ICD-10-CM

## 2018-09-28 DIAGNOSIS — N318 Other neuromuscular dysfunction of bladder: Secondary | ICD-10-CM

## 2018-09-28 DIAGNOSIS — M519 Unspecified thoracic, thoracolumbar and lumbosacral intervertebral disc disorder: Secondary | ICD-10-CM

## 2018-09-28 DIAGNOSIS — I110 Hypertensive heart disease with heart failure: Secondary | ICD-10-CM

## 2018-09-28 DIAGNOSIS — M869 Osteomyelitis, unspecified: Secondary | ICD-10-CM

## 2018-09-28 DIAGNOSIS — E1142 Type 2 diabetes mellitus with diabetic polyneuropathy: Secondary | ICD-10-CM

## 2018-09-28 MED ORDER — ATORVASTATIN 80 MG TABLET
ORAL_TABLET | Freq: Every evening | ORAL | 1 refills | 0 days | Status: CP
Start: 2018-09-28 — End: 2019-09-28

## 2018-10-02 ENCOUNTER — Other Ambulatory Visit: Admit: 2018-10-02 | Discharge: 2018-10-03 | Payer: MEDICAID

## 2018-10-02 DIAGNOSIS — Z6841 Body Mass Index (BMI) 40.0 and over, adult: Secondary | ICD-10-CM

## 2018-10-02 DIAGNOSIS — K219 Gastro-esophageal reflux disease without esophagitis: Secondary | ICD-10-CM

## 2018-10-02 DIAGNOSIS — F603 Borderline personality disorder: Secondary | ICD-10-CM

## 2018-10-02 DIAGNOSIS — Z794 Long term (current) use of insulin: Secondary | ICD-10-CM

## 2018-10-02 DIAGNOSIS — F319 Bipolar disorder, unspecified: Secondary | ICD-10-CM

## 2018-10-02 DIAGNOSIS — M519 Unspecified thoracic, thoracolumbar and lumbosacral intervertebral disc disorder: Secondary | ICD-10-CM

## 2018-10-02 DIAGNOSIS — F419 Anxiety disorder, unspecified: Secondary | ICD-10-CM

## 2018-10-02 DIAGNOSIS — T8743 Infection of amputation stump, right lower extremity: Principal | ICD-10-CM

## 2018-10-02 DIAGNOSIS — I5032 Chronic diastolic (congestive) heart failure: Secondary | ICD-10-CM

## 2018-10-02 DIAGNOSIS — Z87891 Personal history of nicotine dependence: Secondary | ICD-10-CM

## 2018-10-02 DIAGNOSIS — I110 Hypertensive heart disease with heart failure: Secondary | ICD-10-CM

## 2018-10-02 DIAGNOSIS — L03115 Cellulitis of right lower limb: Secondary | ICD-10-CM

## 2018-10-02 DIAGNOSIS — G894 Chronic pain syndrome: Secondary | ICD-10-CM

## 2018-10-02 DIAGNOSIS — M0579 Rheumatoid arthritis with rheumatoid factor of multiple sites without organ or systems involvement: Secondary | ICD-10-CM

## 2018-10-02 DIAGNOSIS — F5105 Insomnia due to other mental disorder: Secondary | ICD-10-CM

## 2018-10-02 DIAGNOSIS — M869 Osteomyelitis, unspecified: Secondary | ICD-10-CM

## 2018-10-02 DIAGNOSIS — J449 Chronic obstructive pulmonary disease, unspecified: Secondary | ICD-10-CM

## 2018-10-02 DIAGNOSIS — E039 Hypothyroidism, unspecified: Secondary | ICD-10-CM

## 2018-10-02 DIAGNOSIS — E1142 Type 2 diabetes mellitus with diabetic polyneuropathy: Secondary | ICD-10-CM

## 2018-10-02 DIAGNOSIS — E1169 Type 2 diabetes mellitus with other specified complication: Secondary | ICD-10-CM

## 2018-10-02 DIAGNOSIS — N318 Other neuromuscular dysfunction of bladder: Secondary | ICD-10-CM

## 2018-10-02 DIAGNOSIS — G4733 Obstructive sleep apnea (adult) (pediatric): Secondary | ICD-10-CM

## 2018-10-02 DIAGNOSIS — T380X5D Adverse effect of glucocorticoids and synthetic analogues, subsequent encounter: Secondary | ICD-10-CM

## 2018-10-02 DIAGNOSIS — E1165 Type 2 diabetes mellitus with hyperglycemia: Secondary | ICD-10-CM

## 2018-10-04 ENCOUNTER — Ambulatory Visit: Admit: 2018-10-04 | Discharge: 2018-10-05 | Payer: MEDICAID | Attending: Family | Primary: Family

## 2018-10-04 DIAGNOSIS — M519 Unspecified thoracic, thoracolumbar and lumbosacral intervertebral disc disorder: Secondary | ICD-10-CM

## 2018-10-04 DIAGNOSIS — N318 Other neuromuscular dysfunction of bladder: Secondary | ICD-10-CM

## 2018-10-04 DIAGNOSIS — Z87891 Personal history of nicotine dependence: Secondary | ICD-10-CM

## 2018-10-04 DIAGNOSIS — T8743 Infection of amputation stump, right lower extremity: Principal | ICD-10-CM

## 2018-10-04 DIAGNOSIS — F419 Anxiety disorder, unspecified: Secondary | ICD-10-CM

## 2018-10-04 DIAGNOSIS — M0579 Rheumatoid arthritis with rheumatoid factor of multiple sites without organ or systems involvement: Secondary | ICD-10-CM

## 2018-10-04 DIAGNOSIS — E1165 Type 2 diabetes mellitus with hyperglycemia: Principal | ICD-10-CM

## 2018-10-04 DIAGNOSIS — F603 Borderline personality disorder: Secondary | ICD-10-CM

## 2018-10-04 DIAGNOSIS — E1142 Type 2 diabetes mellitus with diabetic polyneuropathy: Secondary | ICD-10-CM

## 2018-10-04 DIAGNOSIS — E039 Hypothyroidism, unspecified: Secondary | ICD-10-CM

## 2018-10-04 DIAGNOSIS — G894 Chronic pain syndrome: Secondary | ICD-10-CM

## 2018-10-04 DIAGNOSIS — E1169 Type 2 diabetes mellitus with other specified complication: Secondary | ICD-10-CM

## 2018-10-04 DIAGNOSIS — Z794 Long term (current) use of insulin: Secondary | ICD-10-CM

## 2018-10-04 DIAGNOSIS — Z6841 Body Mass Index (BMI) 40.0 and over, adult: Secondary | ICD-10-CM

## 2018-10-04 DIAGNOSIS — I5032 Chronic diastolic (congestive) heart failure: Secondary | ICD-10-CM

## 2018-10-04 DIAGNOSIS — T380X5D Adverse effect of glucocorticoids and synthetic analogues, subsequent encounter: Secondary | ICD-10-CM

## 2018-10-04 DIAGNOSIS — G4733 Obstructive sleep apnea (adult) (pediatric): Secondary | ICD-10-CM

## 2018-10-04 DIAGNOSIS — F5105 Insomnia due to other mental disorder: Secondary | ICD-10-CM

## 2018-10-04 DIAGNOSIS — K219 Gastro-esophageal reflux disease without esophagitis: Secondary | ICD-10-CM

## 2018-10-04 DIAGNOSIS — L03115 Cellulitis of right lower limb: Secondary | ICD-10-CM

## 2018-10-04 DIAGNOSIS — F319 Bipolar disorder, unspecified: Secondary | ICD-10-CM

## 2018-10-04 DIAGNOSIS — I110 Hypertensive heart disease with heart failure: Secondary | ICD-10-CM

## 2018-10-04 DIAGNOSIS — J449 Chronic obstructive pulmonary disease, unspecified: Secondary | ICD-10-CM

## 2018-10-04 DIAGNOSIS — M869 Osteomyelitis, unspecified: Secondary | ICD-10-CM

## 2018-10-04 MED ORDER — METFORMIN 500 MG TABLET
ORAL_TABLET | Freq: Two times a day (BID) | ORAL | 3 refills | 0 days | Status: CP
Start: 2018-10-04 — End: 2019-10-04

## 2018-10-09 DIAGNOSIS — Z87891 Personal history of nicotine dependence: Secondary | ICD-10-CM

## 2018-10-09 DIAGNOSIS — E039 Hypothyroidism, unspecified: Secondary | ICD-10-CM

## 2018-10-09 DIAGNOSIS — I110 Hypertensive heart disease with heart failure: Secondary | ICD-10-CM

## 2018-10-09 DIAGNOSIS — F603 Borderline personality disorder: Secondary | ICD-10-CM

## 2018-10-09 DIAGNOSIS — M0579 Rheumatoid arthritis with rheumatoid factor of multiple sites without organ or systems involvement: Secondary | ICD-10-CM

## 2018-10-09 DIAGNOSIS — T8743 Infection of amputation stump, right lower extremity: Principal | ICD-10-CM

## 2018-10-09 DIAGNOSIS — I5032 Chronic diastolic (congestive) heart failure: Secondary | ICD-10-CM

## 2018-10-09 DIAGNOSIS — Z6841 Body Mass Index (BMI) 40.0 and over, adult: Secondary | ICD-10-CM

## 2018-10-09 DIAGNOSIS — F319 Bipolar disorder, unspecified: Secondary | ICD-10-CM

## 2018-10-09 DIAGNOSIS — N318 Other neuromuscular dysfunction of bladder: Secondary | ICD-10-CM

## 2018-10-09 DIAGNOSIS — E1169 Type 2 diabetes mellitus with other specified complication: Secondary | ICD-10-CM

## 2018-10-09 DIAGNOSIS — Z794 Long term (current) use of insulin: Secondary | ICD-10-CM

## 2018-10-09 DIAGNOSIS — K219 Gastro-esophageal reflux disease without esophagitis: Secondary | ICD-10-CM

## 2018-10-09 DIAGNOSIS — G894 Chronic pain syndrome: Secondary | ICD-10-CM

## 2018-10-09 DIAGNOSIS — F419 Anxiety disorder, unspecified: Secondary | ICD-10-CM

## 2018-10-09 DIAGNOSIS — M519 Unspecified thoracic, thoracolumbar and lumbosacral intervertebral disc disorder: Secondary | ICD-10-CM

## 2018-10-09 DIAGNOSIS — M869 Osteomyelitis, unspecified: Secondary | ICD-10-CM

## 2018-10-09 DIAGNOSIS — E1165 Type 2 diabetes mellitus with hyperglycemia: Secondary | ICD-10-CM

## 2018-10-09 DIAGNOSIS — E1142 Type 2 diabetes mellitus with diabetic polyneuropathy: Secondary | ICD-10-CM

## 2018-10-09 DIAGNOSIS — L03115 Cellulitis of right lower limb: Secondary | ICD-10-CM

## 2018-10-09 DIAGNOSIS — T380X5D Adverse effect of glucocorticoids and synthetic analogues, subsequent encounter: Secondary | ICD-10-CM

## 2018-10-09 DIAGNOSIS — G4733 Obstructive sleep apnea (adult) (pediatric): Secondary | ICD-10-CM

## 2018-10-09 DIAGNOSIS — J449 Chronic obstructive pulmonary disease, unspecified: Secondary | ICD-10-CM

## 2018-10-09 DIAGNOSIS — F5105 Insomnia due to other mental disorder: Secondary | ICD-10-CM

## 2018-10-11 ENCOUNTER — Ambulatory Visit: Admit: 2018-10-11 | Discharge: 2018-10-11 | Payer: MEDICAID

## 2018-10-11 ENCOUNTER — Ambulatory Visit
Admit: 2018-10-11 | Discharge: 2018-10-11 | Payer: MEDICAID | Attending: Foot & Ankle Surgery | Primary: Foot & Ankle Surgery

## 2018-10-11 DIAGNOSIS — S98131A Complete traumatic amputation of one right lesser toe, initial encounter: Secondary | ICD-10-CM

## 2018-10-11 DIAGNOSIS — L03119 Cellulitis of unspecified part of limb: Secondary | ICD-10-CM

## 2018-10-11 DIAGNOSIS — M7989 Other specified soft tissue disorders: Principal | ICD-10-CM

## 2018-10-11 DIAGNOSIS — E11621 Type 2 diabetes mellitus with foot ulcer: Principal | ICD-10-CM

## 2018-10-11 DIAGNOSIS — L97412 Non-pressure chronic ulcer of right heel and midfoot with fat layer exposed: Secondary | ICD-10-CM

## 2018-10-27 ENCOUNTER — Other Ambulatory Visit: Payer: Self-pay | Admitting: Psychiatry

## 2018-11-01 ENCOUNTER — Ambulatory Visit: Admit: 2018-11-01 | Discharge: 2018-11-02 | Payer: MEDICAID | Attending: Family | Primary: Family

## 2018-11-01 DIAGNOSIS — R300 Dysuria: Secondary | ICD-10-CM

## 2018-11-01 DIAGNOSIS — N3000 Acute cystitis without hematuria: Secondary | ICD-10-CM

## 2018-11-01 DIAGNOSIS — E1165 Type 2 diabetes mellitus with hyperglycemia: Principal | ICD-10-CM

## 2018-11-01 MED ORDER — NITROFURANTOIN MONOHYDRATE/MACROCRYSTALS 100 MG CAPSULE
ORAL_CAPSULE | Freq: Two times a day (BID) | ORAL | 0 refills | 7.00000 days | Status: CP
Start: 2018-11-01 — End: 2018-11-17

## 2018-11-02 ENCOUNTER — Encounter: Payer: Self-pay | Admitting: Pain Medicine

## 2018-11-05 NOTE — Progress Notes (Signed)
Pain Management Virtual Encounter Note - Virtual Visit via Telephone Telehealth (real-time audio visits between healthcare provider and patient).   Patient's Phone No. & Preferred Pharmacy:  [phone] (home); [phone] (mobile); (Preferred) [phone] [email or web address]  [facility] [place] - [place], [place] - [address] [place] [address] Phone: [phone] Fax: [phone]    Pre-screening note:  Our staff contacted [name] [name] and offered her an "in person", "face-to-face" appointment versus a telephone encounter. She indicated preferring the telephone encounter, at this time.   Reason for Virtual Visit: COVID-19*  Social distancing based on CDC and AMA recommendations.   I contacted RASHAE ROTHER on 11/06/2018 via telephone.      I clearly identified myself as Oswaldo Done, MD. I verified that I was speaking with the correct person using two identifiers (Name: [name] [name], and date of birth: Feb 02, 1961).  Advanced Informed Consent I sought verbal advanced consent from [name] [name] for virtual visit interactions. I informed [name] [name] of possible security and privacy concerns, risks, and limitations associated with providing "not-in-person" medical evaluation and management services. I also informed [name] [name] of the availability of "in-person" appointments. Finally, I informed her that there would be a charge for the virtual visit and that she could be  personally, fully or partially, financially responsible for it. [name] [name] expressed understanding and agreed to proceed.   Historic Elements   [name] [name] is a 58 y.o. year old, female patient evaluated today after her last encounter by our practice on 07/20/2018. [name] [name]  has a past medical history of Abdominal wall hernia (01/29/2013), Anxiety, Arthritis, C. difficile colitis, Chronic diastolic heart failure (HCC), Depression, Diabetes mellitus, Diastolic CHF (HCC),  Esophagitis, Fluid retention, GERD (gastroesophageal reflux disease), Hiatal hernia, Hypertension, Hypokalemia due to loss of potassium (10/21/2015), Hypothyroidism, IBS (irritable bowel syndrome), Moderate episode of recurrent major depressive disorder (HCC) (06/03/2004), Morbid obesity (HCC), MRSA (methicillin resistant Staphylococcus aureus) infection (11/2017), Neurogenic bladder, Neuropathy, Obesity, Panic attacks, Rheumatoid arthritis (HCC), and Sleep apnea. She also  has a past surgical history that includes Tubal ligation; Tonsillectomy; Cholecystectomy; Abdominal hysterectomy; Laparoscopic gastric banding (03/20/07); Eye surgery; Hernia repair; and DG GREAT TOE RIGHT FOOT (02/23/2018). [name] [name] has a current medication list which includes the following prescription(s): aripiprazole, atorvastatin, b-d insulin syringe 1cc/25g, buspirone, calcium carb-ergocalciferol, cephalexin, cholecalciferol, clotrimazole-betamethasone, diclofenac sodium, fluoxetine, folic acid, hydroxychloroquine, hydroxyzine, insulin lispro, klor-con m20, lantus, leflunomide, levothyroxine, lisinopril, loperamide, lovastatin, melatonin, metformin, methotrexate, oxycodone, oxycodone, oxycodone, phenazopyridine, pregabalin, promethazine, quetiapine, vitamin d (ergocalciferol), and zolpidem. She  reports that she quit smoking about 19 years ago. Her smoking use included cigarettes. She has a 54.00 pack-year smoking history. She has never used smokeless tobacco. She reports that she does not drink alcohol or use drugs. [name] [name] is allergic to codeine; propoxyphene; sulfa antibiotics; and hydrocodone.   HPI  Today, she is being contacted for medication management.  Patient questioned wound about oxycodone obtained at [facility].  Pharmacotherapy Assessment  Analgesic: Oxycodone IR5 mg, 1 tab PO q 6hrs (20mg /dayof oxycodone) MME/day:30mg /day.   Monitoring: Pharmacotherapy: No side-effects or adverse reactions reported. [place] PMP: PDMP  reviewed during this encounter.       Compliance: No problems identified. Effectiveness: Clinically acceptable. Plan: Refer to "POC".  Pertinent Labs   SAFETY SCREENING Profile Lab Results  Component Value Date   STAPHAUREUS NEGATIVE 09/17/2011   MRSAPCR NEGATIVE 09/17/2011   Renal Function Lab Results  Component Value Date   BUN 11 08/15/2016  CREATININE 0.49 08/15/2016   GFRAA >60 08/15/2016   GFRNONAA >60 08/15/2016   Hepatic Function Lab Results  Component Value Date   AST 21 08/15/2016   ALT 14 08/15/2016   ALBUMIN 3.6 08/15/2016   UDS Summary  Date Value Ref Range Status  03/23/2018 FINAL  Final    Comment:    ==================================================================== TOXASSURE SELECT 13 (MW) ==================================================================== Test                             Result       Flag       Units Drug Present and Declared for Prescription Verification   Oxycodone                      417          EXPECTED   ng/mg creat   Oxymorphone                    112          EXPECTED   ng/mg creat   Noroxycodone                   2580         EXPECTED   ng/mg creat   Noroxymorphone                 81           EXPECTED   ng/mg creat    Sources of oxycodone are scheduled prescription medications.    Oxymorphone, noroxycodone, and noroxymorphone are expected    metabolites of oxycodone. Oxymorphone is also available as a    scheduled prescription medication. ==================================================================== Test                      Result    Flag   Units      Ref Range   Creatinine              102              mg/dL      >=56>=20 ==================================================================== Declared Medications:  The flagging and interpretation on this report are based on the  following declared medications.  Unexpected results may arise from  inaccuracies in the declared medications.  **Note: The testing  scope of this panel includes these medications:  Oxycodone  Oxycodone (Oxycodone Acetaminophen)  **Note: The testing scope of this panel does not include following  reported medications:  Acetaminophen (Oxycodone Acetaminophen)  Aripiprazole (Abilify)  Atorvastatin (Lipitor)  Buspirone (BuSpar)  Calcium carbonate (Calcium Carb)  Cholecalciferol  Fluconazole (Diflucan)  Fluoxetine (Prozac)  Folic acid (Folvite)  Hydroxychloroquine (Plaquenil)  Hydroxyzine (Atarax)  Hydroxyzine (Vistaril)  Insulin (Humalog)  Insulin (Lantus)  Leflunomide (Arava)  Levothyroxine  Lisinopril  Loperamide (Imodium)  Lovastatin (Mevacor)  Melatonin  Metformin (Glucophage)  Methotrexate  Potassium (Klor-Con)  Pregabalin (Lyrica)  Promethazine (Phenergan)  Quetiapine (Seroquel)  Topical  Topical Diclofenac  Vitamin D2 (Drisdol)  Vitamin D2 (Ergocalciferol)  Zolpidem (Ambien) ==================================================================== For clinical consultation, please call [phone]. ====================================================================    Note: Above Lab results reviewed.  Recent imaging  DG Foot Complete Right Please see detailed radiograph report in office note.  Assessment  The primary encounter diagnosis was Chronic pain syndrome. Diagnoses of Chronic foot pain (Primary Area of Pain) (Bilateral) (L>R), Chronic wrist pain (Secondary area of Pain) (Bilateral) (L>R), Chronic elbow pain (Third  area of Pain) (Bilateral) (L>R), Rheumatoid arthritis with positive rheumatoid factor, involving unspecified site [facility]), Neurogenic pain, Diabetic peripheral neuropathy (HCC), Neuropathic pain, Pharmacologic therapy, Disorder of skeletal system, and Problems influencing health status were also pertinent to this visit.  Plan of Care  I have discontinued Tyana Starmer. Hosking's oxyCODONE-acetaminophen, oxyCODONE, oxyCODONE, and oxyCODONE. I have also changed her pregabalin and  oxyCODONE. Additionally, I am having her start on oxyCODONE and oxyCODONE. Lastly, I am having her maintain her clotrimazole-betamethasone, hydrOXYzine, folic acid, levothyroxine, Klor-Con M20, lovastatin, hydroxychloroquine, lisinopril, loperamide, metFORMIN, B-D INSULIN SYRINGE 1CC/25G, methotrexate, Cholecalciferol, leflunomide, atorvastatin, Calcium Carb-Ergocalciferol, Diclofenac Sodium, Vitamin D (Ergocalciferol), Lantus, Melatonin, promethazine, insulin lispro, phenazopyridine, busPIRone, QUEtiapine, zolpidem, cephALEXin, FLUoxetine, and ARIPiprazole. We will stop administering lactated ringers.  Pharmacotherapy (Medications Ordered): Meds ordered this encounter  Medications  . pregabalin (LYRICA) 150 MG capsule    Sig: Take 1 capsule (150 mg total) by mouth 3 (three) times daily.    Dispense:  90 capsule    Refill:  0    Do not add this medication to the electronic "Automatic Refill" notification system. Patient may have prescription filled one day early if pharmacy is closed on scheduled refill date.  Marland Kitchen oxyCODONE (OXY IR/ROXICODONE) 5 MG immediate release tablet    Sig: Take 1 tablet (5 mg total) by mouth every 6 (six) hours as needed for severe pain. Must last 30 days    Dispense:  120 tablet    Refill:  0    Chronic Pain: STOP Act (Not applicable) Fill 1 day early if closed on refill date. Do not fill until: 11/16/2018. To last until: 12/16/2018. Avoid benzodiazepines within 8 hours of opioids  . oxyCODONE (OXY IR/ROXICODONE) 5 MG immediate release tablet    Sig: Take 1 tablet (5 mg total) by mouth every 6 (six) hours as needed for severe pain. Must last 30 days    Dispense:  120 tablet    Refill:  0    Chronic Pain: STOP Act (Not applicable) Fill 1 day early if closed on refill date. Do not fill until: 12/16/2018. To last until: 01/15/2019. Avoid benzodiazepines within 8 hours of opioids  . oxyCODONE (OXY IR/ROXICODONE) 5 MG immediate release tablet    Sig: Take 1 tablet (5 mg total) by  mouth every 6 (six) hours as needed for severe pain. Must last 30 days    Dispense:  120 tablet    Refill:  0    Chronic Pain: STOP Act (Not applicable) Fill 1 day early if closed on refill date. Do not fill until: 01/15/2019. To last until: 02/14/2019. Avoid benzodiazepines within 8 hours of opioids   Orders:  Orders Placed This Encounter  Procedures  . ToxASSURE Select 13 (MW), Urine    Volume: 30 ml(s). Minimum 3 ml of urine is needed. Document temperature of fresh sample. Indications: Long term (current) use of opiate analgesic (Z79.891)  . Comp. Metabolic Panel (12)    With GFR. Indications: Chronic Pain Syndrome (G89.4) & Pharmacotherapy (X48.016)    Order Specific Question:   Has the patient fasted?    Answer:   No    Order Specific Question:   CC Results    Answer:   PCP-NURSE [[id number]]  . Magnesium    Indication: Pharmacologic therapy (P53.748)    Order Specific Question:   CC Results    Answer:   PCP-NURSE [[id number]]  . Vitamin B12    Indication: Pharmacologic therapy (L54.492).    Order Specific Question:   CC  Results    Answer:   PCP-NURSE [[id number]]  . Sedimentation rate    Indication: Disorder of skeletal system (M89.9)    Order Specific Question:   CC Results    Answer:   PCP-NURSE [[id number]]  . 25-Hydroxyvitamin D Lcms D2+D3    Indication: Disorder of skeletal system (M89.9).    Order Specific Question:   CC Results    Answer:   PCP-NURSE [[id number]]  . C-reactive protein    Indication: Problems influencing health status (Z78.9)    Order Specific Question:   CC Results    Answer:   PCP-NURSE [[id number]]   Follow-up plan:   Return in about 3 months (around 02/14/2019) for (VV), E/M (MM).      Interventional therapies: Planned, scheduled, and/or pending:   Not at this time.   Considering:   Diagnostic/therapeutic IV lidocaine infusions.  Diagnostic bilateral lumbar sympathetic block  Diagnostic bilateral intra-articular shoulder joint injection  Diagnostic bilateral  intra-articular knee injections with local anesthetic and steroid   Diagnostic right-sided CESI  Diagnostic bilateral Cervicalfacet block  Possible bilateral cervical facet RFA.    Palliative PRN treatment(s):   Palliative IV lidocaine infusion     Recent Visits No visits were found meeting these conditions.  Showing recent visits within past 90 days and meeting all other requirements   Today's Visits Date Type Provider Dept  11/06/18 Office Visit Milinda Pointer, MD [facility]  Showing today's visits and meeting all other requirements   Future Appointments No visits were found meeting these conditions.  Showing future appointments within next 90 days and meeting all other requirements   I discussed the assessment and treatment plan with the patient. The patient was provided an opportunity to ask questions and all were answered. The patient agreed with the plan and demonstrated an understanding of the instructions.  Patient advised to call back or seek an in-person evaluation if the symptoms or condition worsens.  Total duration of non-face-to-face encounter: 12 minutes.  Note by: Gaspar Cola, MD Date: 11/06/2018; Time: 1:25 PM  Note: This dictation was prepared with Dragon dictation. Any transcriptional errors that may result from this process are unintentional.  Disclaimer:  * Given the special circumstances of the COVID-19 pandemic, the federal government has announced that the Office for Civil Rights (OCR) will exercise its enforcement discretion and will not impose penalties on physicians using telehealth in the event of noncompliance with regulatory requirements under the [place] (HIPAA) in connection with the good faith provision of telehealth during the HALPF-79 national public health emergency. ([place])

## 2018-11-06 ENCOUNTER — Other Ambulatory Visit: Payer: Self-pay

## 2018-11-06 ENCOUNTER — Ambulatory Visit: Payer: Medicaid Other | Attending: Nurse Practitioner | Admitting: Pain Medicine

## 2018-11-06 DIAGNOSIS — E1142 Type 2 diabetes mellitus with diabetic polyneuropathy: Secondary | ICD-10-CM

## 2018-11-06 DIAGNOSIS — G8929 Other chronic pain: Secondary | ICD-10-CM

## 2018-11-06 DIAGNOSIS — M79673 Pain in unspecified foot: Secondary | ICD-10-CM

## 2018-11-06 DIAGNOSIS — M792 Neuralgia and neuritis, unspecified: Secondary | ICD-10-CM

## 2018-11-06 DIAGNOSIS — G894 Chronic pain syndrome: Secondary | ICD-10-CM | POA: Diagnosis not present

## 2018-11-06 DIAGNOSIS — M899 Disorder of bone, unspecified: Secondary | ICD-10-CM | POA: Insufficient documentation

## 2018-11-06 DIAGNOSIS — M25532 Pain in left wrist: Secondary | ICD-10-CM

## 2018-11-06 DIAGNOSIS — M25531 Pain in right wrist: Secondary | ICD-10-CM

## 2018-11-06 DIAGNOSIS — M25529 Pain in unspecified elbow: Secondary | ICD-10-CM | POA: Diagnosis not present

## 2018-11-06 DIAGNOSIS — Z789 Other specified health status: Secondary | ICD-10-CM | POA: Insufficient documentation

## 2018-11-06 DIAGNOSIS — Z79899 Other long term (current) drug therapy: Secondary | ICD-10-CM | POA: Insufficient documentation

## 2018-11-06 DIAGNOSIS — M059 Rheumatoid arthritis with rheumatoid factor, unspecified: Secondary | ICD-10-CM

## 2018-11-06 MED ORDER — OXYCODONE HCL 5 MG PO TABS: 5.0000 mg | ORAL_TABLET | Freq: Four times a day (QID) | ORAL | 0 refills | Status: DC | PRN

## 2018-11-06 MED ORDER — PREGABALIN 150 MG PO CAPS: 150.0000 mg | ORAL_CAPSULE | Freq: Three times a day (TID) | ORAL | 0 refills | Status: DC

## 2018-11-06 NOTE — Patient Instructions (Signed)
____________________________________________________________________________________________  Medication Recommendations and Reminders  Applies to: All patients receiving prescriptions (written and/or electronic).  Medication Rules & Regulations: These rules and regulations exist for your safety and that of others. They are not flexible and neither are we. Dismissing or ignoring them will be considered "non-compliance" with medication therapy, resulting in complete and irreversible termination of such therapy. (See document titled "Medication Rules" for more details.) In all conscience, because of safety reasons, we cannot continue providing a therapy where the patient does not follow instructions.  Pharmacy of record:   Definition: This is the pharmacy where your electronic prescriptions will be sent.   We do not endorse any particular pharmacy.  You are not restricted in your choice of pharmacy.  The pharmacy listed in the electronic medical record should be the one where you want electronic prescriptions to be sent.  If you choose to change pharmacy, simply notify our nursing staff of your choice of new pharmacy.  Recommendations:  Keep all of your pain medications in a safe place, under lock and key, even if you live alone.   After you fill your prescription, take 1 week's worth of pills and put them away in a safe place. You should keep a separate, properly labeled bottle for this purpose. The remainder should be kept in the original bottle. Use this as your primary supply, until it runs out. Once it's gone, then you know that you have 1 week's worth of medicine, and it is time to come in for a prescription refill. If you do this correctly, it is unlikely that you will ever run out of medicine.  To make sure that the above recommendation works, it is very important that you make sure your medication refill appointments are scheduled at least 1 week before you run out of medicine. To do  this in an effective manner, make sure that you do not leave the office without scheduling your next medication management appointment. Always ask the nursing staff to show you in your prescription , when your medication will be running out. Then arrange for the receptionist to get you a return appointment, at least 7 days before you run out of medicine. Do not wait until you have 1 or 2 pills left, to come in. This is very poor planning and does not take into consideration that we may need to cancel appointments due to bad weather, sickness, or emergencies affecting our staff.  "Partial Fill": If for any reason your pharmacy does not have enough pills/tablets to completely fill or refill your prescription, do not allow for a "partial fill". You will need a separate prescription to fill the remaining amount, which we will not provide. If the reason for the partial fill is your insurance, you will need to talk to the pharmacist about payment alternatives for the remaining tablets, but again, do not accept a partial fill.  Prescription refills and/or changes in medication(s):   Prescription refills, and/or changes in dose or medication, will be conducted only during scheduled medication management appointments. (Applies to both, written and electronic prescriptions.)  No refills on procedure days. No medication will be changed or started on procedure days. No changes, adjustments, and/or refills will be conducted on a procedure day. Doing so will interfere with the diagnostic portion of the procedure.  No phone refills. No medications will be "called into the pharmacy".  No Fax refills.  No weekend refills.  No Holliday refills.  No after hours refills.  Remember:  Business   hours are:  Monday to Thursday 8:00 AM to 4:00 PM Provider's Schedule: Crystal King, NP - Appointments are:  Medication management: Monday to Thursday 8:00 AM to 4:00 PM Riyaan Heroux, MD - Appointments are:   Medication management: Monday and Wednesday 8:00 AM to 4:00 PM Procedure day: Tuesday and Thursday 7:30 AM to 4:00 PM Bilal Lateef, MD - Appointments are:  Medication management: Tuesday and Thursday 8:00 AM to 4:00 PM Procedure day: Monday and Wednesday 7:30 AM to 4:00 PM (Last update: 06/16/2017) ____________________________________________________________________________________________    

## 2018-11-07 ENCOUNTER — Encounter: Payer: Medicaid Other | Admitting: Nurse Practitioner

## 2018-11-07 ENCOUNTER — Ambulatory Visit: Admit: 2018-11-07 | Discharge: 2018-11-08 | Payer: MEDICAID

## 2018-11-07 DIAGNOSIS — M869 Osteomyelitis, unspecified: Principal | ICD-10-CM

## 2018-11-13 ENCOUNTER — Encounter: Payer: Medicaid Other | Admitting: Pain Medicine

## 2018-11-19 LAB — TOXASSURE SELECT 13 (MW), URINE

## 2018-11-19 NOTE — Unmapped (Signed)
RHEUMATOLOGY CLINIC FOLLOW-UP NOTE    The limitations of this telemedicine encounter were discussed with patient. Both the patient and myself agreed to this encounter despite these limitations. Benefits of this telemedicine encounter included allowing for continued care of patient and minimizing risk of exposure to COVID-19. Patient also aware that this is a billable encounter with possible copay.       Primary Care Provider: Noralyn Pick, FNP    HPI:  Amanda Hanson is a 58 y.o.  female with a past medical history of hypothyroidism, obesity, heart failure, DM (A1C 13.4% 08/2018), chronic pain who is here for phone follow-up of seropositive (+RF, +anti-CCP), non-erosive rheumatoid arthritis.    She was last seen by Dr. Irving Burton in 09/2018 for phone encounter. At that time, she had recently underwent amputation of her right second toe in 07/2018, but developed worsening erythema, swelling, and purulence at amputation site so presented to the ED 5/5 and was started on vancomycin and ertapenem. X-Ray confirmed osteomyelitis and she underwent amputation on 08/24/2018. Due to these infections, she had been on and off her methotrexate and Actemra. The plan was to continue prednisone 10 mg daily until her infection was under control and wound was healed. She was noted to have active synovitis on exam. She was asked to continue to hold Actemra and methotrexate.       Since then, she finished her vancomycin and ertapenem 10/03/2018. She saw her family medicine physician for her diabetes and was increased on her insulin. She developed a UTI 7/15 and was on nitrofurantoin. She saw her pain medicine doctor 7/20 and was given oxycodone and Lyrica. She saw ID 7/21 with no recurrence of infection.     Today, she reports that she is in a lot of pain. She reports that she has pain in her joints, cannot close her hands, also has pain in shoulders, wrists, jaw, has trouble walking. Currently on prednisone 5 mg, has been a few weeks on this dose. Shoulder is not bothering her right now. Has morning stiffness for about 2 hours before she loosens up. She is on Percocet with outside pain medicine doctor, but is out of refills. No OTC pain medications. She has used voltaren gel in the past. Her foot infection is completely healed, PICC removed a few months ago, finished antibiotics. No new skin rashes. No fevers/chills, no cough, congestion, shortness of breath, chest pain. No nausea/vomiting, blood in the stool. Has been having diarrhea because of the metformin (recently on a new dose). No trouble urinating, hematuria or dysuria. Was doing PT prior, but now completely healed. No mouth sores, nasal ulcers, trouble swallowing. No headaches or vision changes. No eye pain.     -----------------------------------------  RA History (per Dr. Irving Burton):  Previously followed by Dr. Zenovia Jordan of [place]. ??She presented for initial evaluation to [facility] March 2017 for transfer of care. ??Patient states that she was initially diagnosed around 2011-2012 and was started on methotrexate and Enbrel at the time. 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 ??No hx of prosthetic joints. ??Patient reports her Enbrel was stopped (due to septic joint) and she was started on Humira; however this caused worsening of heart failure and later stopped. ??She was on Orencia infusions which patient states worked very well for about a year or so but later stopped when she lost insurance around 2013. ??She was also tried on [place] for some time, but she does not remember  when it was stopped. ??When Dr. Lonna Cobb, evaluated the patient she was on Orencia subcutaneous, MTX 25 mg subcutaneous, plaquenil 200 mg bid, and prednisone 5 mg daily. ??She stated she has been on prednisone at varying doses since her diagnosis. ??She had a normal DEXA 07/2015. She also has chronic pain issues and followed with a pain clinic, Dr. Danielle Dess, in [place] - she is on percocet 5 mg 4x/day. ??She had her last Orencia in 11/15/16.  Patient stated that she has taken Harriette Ohara (2 years prior--> about 6 months) in the past with no improvement in her symptpms  11/2016: Because of continued pain, she was started her on Actemra infusion 8mg /kg  and a prednisone taper. Remains on methotrexate.     Review of Systems:  Positive findings noted above, otherwise a review of systems was reviewed and negative    Past Medical, Surgical, Family and Social History reviewed and updated per EMR     Allergies:  Cephalexin, Sulfa (sulfonamide antibiotics), Codeine, Darvocet a500  [propoxyphene n-acetaminophen], Hydrocodone, Meropenem, and Sulfasalazine    Medications:     Current Outpatient Medications:   ???  acetaminophen (TYLENOL) 500 MG tablet, Take 500 mg by mouth Three (3) times a day as needed for pain., Disp: , Rfl:   ???  ARIPiprazole (ABILIFY) 5 MG tablet, Take 5 mg by mouth daily., Disp: , Rfl:   ???  atorvastatin (LIPITOR) 80 MG tablet, Take 1 tablet (80 mg total) by mouth every evening., Disp: 90 tablet, Rfl: 1  ???  busPIRone (BUSPAR) 10 MG tablet, Take 10 mg by mouth Two (2) times a day. , Disp: , Rfl:   ???  diclofenac sodium (VOLTAREN) 1 % gel, Apply 2 g topically nightly., Disp: , Rfl:   ???  FLUoxetine (PROZAC) 20 MG capsule, Take 20 mg by mouth daily. , Disp: , Rfl:   ???  folic acid (FOLVITE) 1 MG tablet, Take 1 tablet (1 mg total) by mouth daily., Disp: 90 tablet, Rfl: 3  ???  insulin glargine (LANTUS U-100 INSULIN) 100 unit/mL injection, Inject 0.65 mL (65 Units total) under the skin daily., Disp: 19.5 mL, Rfl: 0  ???  insulin lispro (HUMALOG U-100 INSULIN) 100 unit/mL injection, INJECT 0.20ML (20 UNITS TOTAL) UNDER THE SKIN THREE TIMES A DAY BEFORE MEALS., Disp: 50 mL, Rfl: 1  ???  levothyroxine (SYNTHROID) 88 MCG tablet, TAKE 1 TABLET BY MOUTH EVERY DAY, Disp: 90 tablet, Rfl: 1  ???  lisinopriL (PRINIVIL,ZESTRIL) 2.5 MG tablet, Take 1 tablet (2.5 mg total) by mouth daily., Disp: 90 tablet, Rfl: 3  ??? LYRICA 150 mg capsule, Take 150 mg by mouth Three (3) times a day., Disp: , Rfl: 0  ???  metFORMIN (GLUCOPHAGE) 500 MG tablet, Take 2 tablets (1,000 mg total) by mouth 2 (two) times a day with meals., Disp: 360 tablet, Rfl: 3  ???  nystatin (MYCOSTATIN) 100,000 unit/gram powder, Apply 1 application topically 4 (four) times a day as needed., Disp: , Rfl:   ???  oxyCODONE (ROXICODONE) 5 MG immediate release tablet, , Disp: , Rfl:   ???  promethazine (PHENERGAN) 25 MG tablet, Take 25 mg by mouth every six (6) hours as needed for nausea. Indications: nausea and vomiting after surgery, Disp: , Rfl:   ???  QUEtiapine (SEROQUEL) 300 MG tablet, Take 300 mg by mouth nightly., Disp: , Rfl:   ???  zolpidem (AMBIEN) 5 MG tablet, Take 5 mg by mouth nightly as needed for sleep., Disp: , Rfl:   ???  blood  sugar diagnostic (ACCU-CHEK GUIDE) Strp, by Other route Four (4) times a day (before meals and nightly). Acuu-chek Guide., Disp: 120 each, Rfl: 5  ???  calcium-vitamin D 250-100 mg-unit per tablet, Take 1 tablet by mouth Two (2) times a day., Disp: 60 tablet, Rfl: 11  ???  insulin syringes, disposable, 1 mL Syrg, 100 Units by Miscellaneous route daily. Insulin injecting diabetes 250.00 Inject daily 100 unit syringe (1ml), Disp: 100 each, Rfl: 3  ???  melatonin 3 mg Tab, Take 1 tablet (3 mg total) by mouth nightly as needed. (Patient not taking: Reported on 11/17/2018), Disp: , Rfl: 0  ???  methotrexate 25 mg/mL injection solution, Inject 1 mL (25 mg total) under the skin once a week., Disp: 10 mL, Rfl: 3  ???  polyethylene glycol (MIRALAX) 17 gram packet, Take 17 g by mouth daily. (Patient not taking: Reported on 11/07/2018), Disp: 30 packet, Rfl: 0  ???  predniSONE (DELTASONE) 5 MG tablet, Take 20 mg daily x 2 weeks, 15 mg daily x 2 weeks, 10 mg daily x 2 weeks, then 5 mg until visit with rheumatology, Disp: 182 tablet, Rfl: 1  ???  senna (SENOKOT) 8.6 mg tablet, Take 2 tablets by mouth nightly as needed for constipation. (Patient not taking: Reported on 11/07/2018), Disp: 60 tablet, Rfl: 0  ???  solifenacin (VESICARE) 5 MG tablet, Take 1 tablet (5 mg total) by mouth daily. (Patient not taking: Reported on 11/17/2018), Disp: 30 tablet, Rfl: 12  ???  tocilizumab (ACTEMRA ACTPEN) 162 mg/0.9 mL PnIj, Inject 162 mg under the skin every seven (7) days., Disp: 12 Syringe, Rfl: 3      Objective   Vitals:    11/17/18 1604   Weight: (!) 148.8 kg (328 lb)   Height: 167.6 cm (5' 6)       Physical Exam  Not performed given phone encounter    Assessment/Plan:    Tashaya Ancrum is a 58 y.o.  female with past medical history of hypothyroidism, obesity, heart failure, DM (A1C 13.4% 08/2018), chronic pain who is here for phone follow-up of seropositive (+RF, +anti-CCP), non-erosive rheumatoid arthritis. She has now completed her IV antibiotics and her infection has completely healed. She has no infectious signs or symptoms today. She reports that she is having significant joint pains and is only on prednisone 5 mg. Her last labs were in 09/2018. We will plan to re-initiate her disease-modifying medications and provide prednisone taper to help control her inflammation today.    1. Seropositive, non-erosive RA:  Reports worsening pain in hands, shoulders, ankles with 2 hours of morning stiffness. Has been off methotrexate and Actemra for several months in the setting of infections.  -will restart methotrexate 25 mg subQ weekly today with daily folic acid  -labs done in 09/2018 WNL Cr, CBC, LFTs: repeat labs in 4 weeks at [facility] (patient preference)  -restart Actemra via subQ weekly injection (sent pen to [facility] pharmacy) so patient does not have to come to clinic for infusions  -started prednisone taper: 20 mg daily x 2 weeks, 15 mg daily x 2 weeks, 10 mg daily x 2 weeks, 5 mg until rheumatology appointment  -given warnings on watching for signs of infection once restarting medications    2. Health Maintenance  Routine health maintenance discussed      - CDC recommends all immunosuppressed adults receive vaccination against pneumonococcus.. Adults 19 years or older who have not received any pneumococcal vaccine, should get a dose of PCV13  first and should also continue to receive the recommended doses of PPSV23. Adults 19 years or older who have previously received one or more doses of PPSV23, should also receive a dose of PCV13 and should continue to receive the remaining recommended doses of PPSV23.  Immunization History   Administered Date(s) Administered   ??? INFLUENZA TIV (TRI) PF (IM) 02/04/2005   ??? Influenza Vaccine Quad (IIV4 PF) 71mo+ injectable 04/14/2015, 03/23/2017, 02/07/2018   ??? PNEUMOCOCCAL POLYSACCHARIDE 23 03/23/2017   ??? Pneumococcal Conjugate 13-Valent 04/22/2014         - DEXA Scan: normal bone density 07/2015      - Vitamin D: added to blood work in 4 weeks, taking Ca-Vitamin D supplement      - Tdap: said she got one 2 years ago?      - Annual flu vaccination: last 01/2018    Patient was seen and discussed with attending physician, Dr. Scarlette Calico    I spent 25 minutes on the phone with the patient. I spent an additional 10 minutes on pre- and post-visit activities.     The patient was physically located in [place] or a state in which I am permitted to provide care. The patient and/or parent/guardian understood that s/he may incur co-pays and cost sharing, and agreed to the telemedicine visit. The visit was reasonable and appropriate under the circumstances given the patient's presentation at the time.    The patient and/or parent/guardian has been advised of the potential risks and limitations of this mode of treatment (including, but not limited to, the absence of in-person examination) and has agreed to be treated using telemedicine. The patient's/patient's family's questions regarding telemedicine have been answered.     If the visit was completed in an ambulatory setting, the patient and/or parent/guardian has also been advised to contact their provider???s office for worsening conditions, and seek emergency medical treatment and/or call 911 if the patient deems either necessary.    Return to clinic in 3 months for follow-up on rheumatoid arthritis.     Tommy Rainwater, MD, PGY-4  [facility]  [phone]  Pager: [phone]

## 2018-11-20 ENCOUNTER — Institutional Professional Consult (permissible substitution): Admit: 2018-11-20 | Discharge: 2018-11-21 | Payer: MEDICAID | Attending: Rheumatology | Primary: Rheumatology

## 2018-11-20 DIAGNOSIS — M0579 Rheumatoid arthritis with rheumatoid factor of multiple sites without organ or systems involvement: Principal | ICD-10-CM

## 2018-11-20 LAB — COMP. METABOLIC PANEL (12)
AST: 12 IU/L (ref 0–40)
Albumin/Globulin Ratio: 1.2 (ref 1.2–2.2)
Albumin: 4.2 g/dL (ref 3.8–4.9)
Alkaline Phosphatase: 93 IU/L (ref 39–117)
BUN/Creatinine Ratio: 16 (ref 9–23)
BUN: 13 mg/dL (ref 6–24)
Bilirubin Total: 0.5 mg/dL (ref 0.0–1.2)
Calcium: 9.4 mg/dL (ref 8.7–10.2)
Chloride: 94 mmol/L — ABNORMAL LOW (ref 96–106)
Creatinine, Ser: 0.8 mg/dL (ref 0.57–1.00)
GFR calc Af Amer: 95 mL/min/{1.73_m2} (ref >59)
GFR calc non Af Amer: 82 mL/min/{1.73_m2} (ref >59)
Globulin, Total: 3.6 g/dL (ref 1.5–4.5)
Glucose: 425 mg/dL — ABNORMAL HIGH (ref 65–99)
Potassium: 3.9 mmol/L (ref 3.5–5.2)
Sodium: 134 mmol/L (ref 134–144)
Total Protein: 7.8 g/dL (ref 6.0–8.5)

## 2018-11-20 LAB — C-REACTIVE PROTEIN: CRP: 14 mg/L — ABNORMAL HIGH (ref 0–10)

## 2018-11-20 LAB — VITAMIN B12: Vitamin B-12: 331 pg/mL (ref 232–1245)

## 2018-11-20 LAB — MAGNESIUM: Magnesium: 1.6 mg/dL (ref 1.6–2.3)

## 2018-11-20 LAB — SEDIMENTATION RATE: Sed Rate: 76 mm/hr — ABNORMAL HIGH (ref 0–40)

## 2018-11-20 LAB — 25-HYDROXY VITAMIN D LCMS D2+D3
25-Hydroxy, Vitamin D-3: 21 ng/mL
25-Hydroxy, Vitamin D: 23 ng/mL — ABNORMAL LOW

## 2018-11-20 LAB — 25-HYDROXYVITAMIN D LCMS D2+D3: 25-Hydroxy, Vitamin D-2: 2.2 ng/mL

## 2018-11-20 MED ORDER — PREDNISONE 5 MG TABLET
ORAL_TABLET | 1 refills | 0 days | Status: CP
Start: 2018-11-20 — End: ?

## 2018-11-20 MED ORDER — METHOTREXATE SODIUM 25 MG/ML INJECTION SOLUTION
SUBCUTANEOUS | 3 refills | 70.00000 days | Status: CP
Start: 2018-11-20 — End: ?

## 2018-11-20 MED ORDER — EMPTY CONTAINER
PRN refills | 0 days
Start: 2018-11-20 — End: ?

## 2018-11-20 MED ORDER — FOLIC ACID 1 MG TABLET
ORAL_TABLET | Freq: Every day | ORAL | 3 refills | 90.00000 days | Status: CP
Start: 2018-11-20 — End: ?

## 2018-11-20 MED ORDER — CALCIUM CITRATE-ERGOCALCIFEROL (VITAMIN D2) 250 MG-100 UNIT TABLET
ORAL_TABLET | Freq: Two times a day (BID) | ORAL | 11 refills | 30 days | Status: CP
Start: 2018-11-20 — End: 2019-11-20

## 2018-11-20 MED ORDER — ACTEMRA ACTPEN 162 MG/0.9 ML SUBCUTANEOUS PEN INJECTOR
SUBCUTANEOUS | 3 refills | 0 days | Status: CP
Start: 2018-11-20 — End: ?
  Filled 2018-11-24: qty 3.6, 28d supply, fill #0

## 2018-11-20 NOTE — Unmapped (Signed)
-----   Message from Tommy Rainwater, MD sent at 11/20/2018 11:30 AM EDT -----  Amanda Fearing,    [name] wanted to get blood work done at [facility] so I did order labs to be done in 4 weeks through [facility]. Wanted to know if there was anything else I needed to do on my end.    Thanks,    Shruti

## 2018-11-20 NOTE — Unmapped (Signed)
Per test claim for ACTEMRA at the [facility], patient needs Medication Assistance Program for Prior Authorization.

## 2018-11-20 NOTE — Unmapped (Signed)
Labs orders faxed to [facility]. Please see routing history in Epic for details. Sent pt MyChart message notifying lab orders have been sent.

## 2018-11-20 NOTE — Unmapped (Signed)
Please start prednisone 20 mg for 2 weeks, then 15 mg for 2 weeks, then 10 mg for 2 weeks, then 5 mg until you see rheumatology    Resume methotrexate injection weekly 25 mg with daily folic acid  Resume Actemra but as injection pens--should be mailed to your house    Continue calcium/vitamin D and can try voltaren gel on joints    Labs in 4 weeks at [facility]

## 2018-11-20 NOTE — Unmapped (Signed)
[facility] Special[facility] Medication Referral: [place] Approved      Medication (Brand/Generic): ACTEMRA    Final Test Claim completed with resulted information below:    Patient ABLE to fill at [facility]  Insurance Company:  [place] MEDICAID  Anticipated Copay: $3  Is anticipated copay with a copay card or grant? No, there is no need for grant or copay assistance.     Does this patient have to receive a partial fill of the medication due to insurance restrictions? NO  If so, please cofirm how many days supply is allowed per plan per fill and how long the patient will have to fill partial months supply for the medication: NOT APPLICABLE     If the copay is under the $25 defined limit, per policy there will be no further investigation of need for financial assistance at this time unless patient requests. This referral has been communicated to the provider and handed off to the [facility] team for further processing and filling of prescribed medication.   ______________________________________________________________________  Please utilize this referral for viewing purposes as it will serve as the central location for all relevant documentation and updates.

## 2018-11-24 ENCOUNTER — Other Ambulatory Visit: Payer: Self-pay | Admitting: Psychiatry

## 2018-11-24 MED FILL — ACTEMRA ACTPEN 162 MG/0.9 ML SUBCUTANEOUS PEN INJECTOR: 28 days supply | Qty: 4 | Fill #0 | Status: AC

## 2018-11-24 MED FILL — EMPTY CONTAINER: 30 days supply | Qty: 1 | Fill #0 | Status: AC

## 2018-11-24 MED FILL — EMPTY CONTAINER: 30 days supply | Qty: 1 | Fill #0

## 2018-11-24 NOTE — Unmapped (Signed)
[facility]   Patient Onboarding/Medication Counseling    Amanda Hanson is a 58 y.o. female with rheumatoid arthritis who I am counseling today on re-initiation of therapy.  I am speaking to the patient.    Verified patient's date of birth / HIPAA.    Specialty medication(s) to be sent: Inflammatory Disorders: Actemra      Non-specialty medications/supplies to be sent: sharps kit       Actemra?? (tocilizumab)    Medication & Administration     Dosage: Rheumatoid arthritis (100kg or more): Inject 162mg  under the skin one time weekly    Lab tests required prior to treatment initiation:  ? Tuberculosis: Tuberculosis screening resulted in a non-reactive Quantiferon TB Gold assay.  ? Absolute neutrophil count: ANC greater than 2,000/mm3.  ? Platelet count: Platelets are greater than 100,000/mm3.  ? Liver function tests: ALT and AST are less than 1.5 x ULN.    Administration:     Prefilled auto-injector pen  1. Gather all supplies needed for injection on a clean, flat working surface: medication pen removed from packaging, alcohol swab, sharps container, etc.  2. Look at the medication label ??? look for correct medication, correct dose, and check the expiration date  3. Look at the medication ??? the liquid visible in the window on the side of the pen device should appear clear and colorless to pale yellow  4. Lay the auto-injector pen on a flat surface and allow it to warm up to room temperature for at least 45 minutes  5. Select injection site ??? you can use the front of your thigh or your belly (but not the area 2 inches around your belly button); if someone else is giving you the injection you can also use your upper arm in the skin covering your triceps muscle  6. Prepare injection site ??? wash your hands and clean the skin at the injection site with an alcohol swab and let it air dry, do not touch the injection site again before the injection  7. Twist and pull off the green safety cap, do not remove until immediately prior to injection and do not touch the needle shield  8. Pinch the skin ??? with your hand not holding the auto-injector pinch up a fold of skin at the injection site using your forefinger and thumb to prepare a firm injection site  9. Put the needle shield against your skin at the injection site at a 90 degree angle, hold the pen such that you can see the clear medication window  10. To initiate the injection unlock the green activation button by pressing the needle shield completely in against the injection site, then depress the green activation button to start the injection ??? you will hear a click sound  11. Continue to press the green activation button and hold the pen firmly against your skin for about 10 seconds ??? the window will start to turn solid purple  12. There will be a second click sound when the injection is complete, verify the window is solid purple before pulling the pen away from your skin  13. Dispose of the used auto-injector pen immediately in your sharps disposal container the needle will be covered automatically  14. If you see any blood at the injection site, press a cotton ball or gauze on the site and maintain pressure until the bleeding stops, do not rub the injection site      Adherence/Missed dose instructions:  If your injection  is given more than 2 days after your scheduled injection date ??? consult your pharmacist for additional instructions on how to adjust your dosing schedule.      Goals of Therapy     ? Achieve low disease activity or remission  ? Slow disease progression  ? Protection of remaining articular structures  ? Maintenance of function  ? Maintenance of effective psychosocial functioning      Side Effects & Monitoring Parameters     ? Injection site reaction (redness, irritation, inflammation localized to the site of administration)  ? Signs of a common cold ??? minor sore throat, runny or stuffy nose, etc.  ? Headache    The following side effects should be reported to the provider:  ? Signs of a hypersensitivity reaction ??? rash; hives; itching; red, swollen, blistered, or peeling skin; wheezing; tightness in the chest or throat; difficulty breathing, swallowing, or talking; swelling of the mouth, face, lips, tongue, or throat; etc.  ? Reduced immune function ??? report signs of infection such as fever; chills; body aches; very bad sore throat; ear or sinus pain; cough; more sputum or change in color of sputum; pain with passing urine; wound that will not heal, etc.  Also at a slightly higher risk of some malignancies (mainly skin and blood cancers) due to this reduced immune function.  o In the case of signs of infection ??? the patient should hold the next dose of Actemra?? and call your primary care provider to ensure adequate medical care.  Treatment may be resumed when infection is treated and patient is asymptomatic.  ? Signs of unexplained bruising or bleeding ??? throwing up blood or emesis that looks like coffee grounds; black, tarry, or bloody stool; etc.  ? Changes in skin ??? a new growth or lump that forms; changes in shape, size, or color of a previous mole or marking  ? Shortness of breath  ? Chest pain/pressure      Warnings, Precautions, & Contraindications     ? Have your bloodwork checked as you have been told by your prescriber (ANC, platelets, lipids, etc.)  ? Patients with concomitant IBD or history of diverticulitis are at an increased risk for GI perforations.  ? Birth control pills and other hormone-based birth control may not work as well to prevent pregnancy  ? Talk with your doctor if you are pregnant, planning to become pregnant, or breastfeeding  ? Discuss the possible need for holding your dose(s) of Actemra?? when a planned procedure is scheduled with the prescriber as it may delay healing/recovery timeline       Drug/Food Interactions     ? Medication list reviewed in Epic. The patient was instructed to inform the care team before taking any new medications or supplements. Actemra can sometimes derease effectiveness of CYP3A4 substrates, therefore Ms. Aaberg was counseled to report any lack of effect she sees with her aripiprazole, atorvastatin, buspirone, oxycodone, quetiapine, solifenacin, and zolpidem. She voiced understanding that she is to let her care team know if she notices any changes after starting the medication.  ? Talk with you prescriber or pharmacist before receiving any live vaccinations while taking this medication and after you stop taking it    Storage, Handling Precautions, & Disposal     ? Store this medication in the refrigerator.  Do not freeze  ? If needed, you may store at room temperature for up to 14 days  ? Store in [employer], protected from light  ? Do  not shake  ? Dispose of used syringes/pens in a sharps disposal container      Current Medications (including OTC/herbals), Comorbidities and Allergies     Current Outpatient Medications   Medication Sig Dispense Refill   ??? acetaminophen (TYLENOL) 500 MG tablet Take 500 mg by mouth Three (3) times a day as needed for pain.     ??? ARIPiprazole (ABILIFY) 5 MG tablet Take 5 mg by mouth daily.     ??? atorvastatin (LIPITOR) 80 MG tablet Take 1 tablet (80 mg total) by mouth every evening. 90 tablet 1   ??? blood sugar diagnostic (ACCU-CHEK GUIDE) Strp by Other route Four (4) times a day (before meals and nightly). Acuu-chek Guide. 120 each 5   ??? busPIRone (BUSPAR) 10 MG tablet Take 10 mg by mouth Two (2) times a day.      ??? calcium-vitamin D 250-100 mg-unit per tablet Take 1 tablet by mouth Two (2) times a day. 60 tablet 11   ??? diclofenac sodium (VOLTAREN) 1 % gel Apply 2 g topically nightly.     ??? FLUoxetine (PROZAC) 20 MG capsule Take 20 mg by mouth daily.      ??? folic acid (FOLVITE) 1 MG tablet Take 1 tablet (1 mg total) by mouth daily. 90 tablet 3   ??? insulin glargine (LANTUS U-100 INSULIN) 100 unit/mL injection Inject 0.65 mL (65 Units total) under the skin daily. 19.5 mL 0 ??? insulin lispro (HUMALOG U-100 INSULIN) 100 unit/mL injection INJECT 0.20ML (20 UNITS TOTAL) UNDER THE SKIN THREE TIMES A DAY BEFORE MEALS. 50 mL 1   ??? insulin syringes, disposable, 1 mL Syrg 100 Units by Miscellaneous route daily. Insulin injecting diabetes 250.00  Inject daily  100 unit syringe (1ml) 100 each 3   ??? levothyroxine (SYNTHROID) 88 MCG tablet TAKE 1 TABLET BY MOUTH EVERY DAY 90 tablet 1   ??? lisinopriL (PRINIVIL,ZESTRIL) 2.5 MG tablet Take 1 tablet (2.5 mg total) by mouth daily. 90 tablet 3   ??? LYRICA 150 mg capsule Take 150 mg by mouth Three (3) times a day.  0   ??? melatonin 3 mg Tab Take 1 tablet (3 mg total) by mouth nightly as needed. (Patient not taking: Reported on 11/17/2018)  0   ??? metFORMIN (GLUCOPHAGE) 500 MG tablet Take 2 tablets (1,000 mg total) by mouth 2 (two) times a day with meals. 360 tablet 3   ??? methotrexate 25 mg/mL injection solution Inject 1 mL (25 mg total) under the skin once a week. 10 mL 3   ??? nystatin (MYCOSTATIN) 100,000 unit/gram powder Apply 1 application topically 4 (four) times a day as needed.     ??? oxyCODONE (ROXICODONE) 5 MG immediate release tablet      ??? polyethylene glycol (MIRALAX) 17 gram packet Take 17 g by mouth daily. (Patient not taking: Reported on 11/07/2018) 30 packet 0   ??? predniSONE (DELTASONE) 5 MG tablet Take 20 mg daily x 2 weeks, 15 mg daily x 2 weeks, 10 mg daily x 2 weeks, then 5 mg until visit with rheumatology 182 tablet 1   ??? promethazine (PHENERGAN) 25 MG tablet Take 25 mg by mouth every six (6) hours as needed for nausea. Indications: nausea and vomiting after surgery     ??? QUEtiapine (SEROQUEL) 300 MG tablet Take 300 mg by mouth nightly.     ??? senna (SENOKOT) 8.6 mg tablet Take 2 tablets by mouth nightly as needed for constipation. (Patient not taking: Reported on 11/07/2018) 60 tablet  0   ??? solifenacin (VESICARE) 5 MG tablet Take 1 tablet (5 mg total) by mouth daily. (Patient not taking: Reported on 11/17/2018) 30 tablet 12   ??? tocilizumab (ACTEMRA ACTPEN) 162 mg/0.9 mL PnIj Inject the contents of 1 pen (162 mg) under the skin every seven (7) days. 10.8 mL 3   ??? zolpidem (AMBIEN) 5 MG tablet Take 5 mg by mouth nightly as needed for sleep.       No current facility-administered medications for this visit.        Allergies   Allergen Reactions   ??? Cephalexin Hives   ??? Sulfa (Sulfonamide Antibiotics) Hives   ??? Codeine Nausea And Vomiting   ??? Darvocet A500  [Propoxyphene N-Acetaminophen] Nausea Only   ??? Hydrocodone Nausea And Vomiting   ??? Meropenem Rash     Erythematous, hot, pruritic rash over arms, chest, back, abdomen, and face occurred at the end of meropenem infusion on 02/22/18   ??? Sulfasalazine Palpitations     makes heart fly, she gets flushed and passes out       Patient Active Problem List   Diagnosis   ??? Major depressive disorder, recurrent episode, moderate (CMS-HCC)   ??? Hypothyroidism   ??? Obesity   ??? Obstructive sleep apnea   ??? Rheumatoid arthritis flare (CMS-HCC)   ??? Overactive detrusor   ??? Mixed incontinence   ??? Hypokalemia due to loss of potassium   ??? Hypomagnesemia   ??? QT prolongation   ??? Type 2 diabetes mellitus with hyperglycemia (CMS-HCC)   ??? Anxiety   ??? GERD (gastroesophageal reflux disease)   ??? Neuropathy   ??? Abdominal wall hernia   ??? Adhesive capsulitis   ??? Mixed anxiety depressive disorder   ??? Bipolar disorder, unspecified (CMS-HCC)   ??? Chronic diastolic CHF (congestive heart failure), NYHA class 3 (CMS-HCC)   ??? COPD (chronic obstructive pulmonary disease) (CMS-HCC)   ??? Diastolic dysfunction   ??? Knee pain, bilateral   ??? Vitamin D deficiency   ??? Chronic pain of both wrists   ??? Chronic pain syndrome   ??? Chronic cystitis   ??? Morbid (severe) obesity due to excess calories (CMS-HCC)   ??? Diabetic ulcer of heel (CMS-HCC)   ??? Perineal abscess   ??? Cellulitis   ??? Diabetic ulcer of toe of right foot associated with type 2 diabetes mellitus (CMS-HCC)   ??? Ulcer of left heel and midfoot with fat layer exposed (CMS-HCC)   ??? Decubitus ulcer of heel, bilateral   ??? Osteomyelitis due to type 2 diabetes mellitus (CMS-HCC)   ??? Subacute vulvitis   ??? MRSA (methicillin resistant Staphylococcus aureus) infection       Reviewed and up to date in Epic.    Appropriateness of Therapy     Is medication and dose appropriate based on diagnosis? Yes    Baseline Quality of Life Assessment      How many days over the past month did your rheumatoid arthritis keep you from your normal activities? Ms Liskey was unable to quantify this.    Financial Information     Medication Assistance provided: Prior Authorization    Anticipated copay of $3.00 reviewed with patient. Verified delivery address.    Delivery Information     Scheduled delivery date: 11/24/2018    Expected start date: 11/28/2018    Medication will be delivered via Same Day Courier to the home address in [place].  This shipment will not require a signature.  Explained the services we provide at [facility] and that each month we would call to set up refills.  Stressed importance of returning phone calls so that we could ensure they receive their medications in time each month.  Informed patient that we should be setting up refills 7-10 days prior to when they will run out of medication.  A pharmacist will reach out to perform a clinical assessment periodically.  Informed patient that a welcome packet and a drug information handout will be sent.      Patient verbalized understanding of the above information as well as how to contact the pharmacy at [phone] option 4 with any questions/concerns.  The pharmacy is open Monday through Friday 8:30am-4:30pm.  A pharmacist is available 24/7 via pager to answer any clinical questions they may have.    Patient Specific Needs     ? Does the patient have any physical, cognitive, or cultural barriers? No    ? Patient prefers to have medications discussed with  Patient     ? Is the patient able to read and understand education materials at a high school level or above? No    ? Patient's primary language is  English     ? Is the patient high risk? No     ? Does the patient require a Care Management Plan? No     ? Does the patient require physician intervention or other additional services (i.e. nutrition, smoking cessation, social work)? No      [name] Morgane Joerger  [facility] Specialty Pharmacist

## 2018-11-30 ENCOUNTER — Ambulatory Visit (INDEPENDENT_AMBULATORY_CARE_PROVIDER_SITE_OTHER): Payer: Medicaid Other | Admitting: Psychiatry

## 2018-11-30 ENCOUNTER — Other Ambulatory Visit: Payer: Self-pay

## 2018-11-30 DIAGNOSIS — F411 Generalized anxiety disorder: Secondary | ICD-10-CM

## 2018-11-30 DIAGNOSIS — F331 Major depressive disorder, recurrent, moderate: Secondary | ICD-10-CM | POA: Diagnosis not present

## 2018-11-30 MED ORDER — ARIPIPRAZOLE 5 MG PO TABS: 5.0000 mg | ORAL_TABLET | Freq: Every day | ORAL | 1 refills | Status: DC

## 2018-11-30 MED ORDER — FLUOXETINE HCL 20 MG PO CAPS: ORAL_CAPSULE | ORAL | 1 refills | Status: DC

## 2018-11-30 MED ORDER — BUSPIRONE HCL 10 MG PO TABS: 10.0000 mg | ORAL_TABLET | Freq: Two times a day (BID) | ORAL | 1 refills | Status: DC

## 2018-11-30 MED ORDER — QUETIAPINE FUMARATE 300 MG PO TABS: ORAL_TABLET | ORAL | 1 refills | Status: DC

## 2018-11-30 MED ORDER — ZOLPIDEM TARTRATE 5 MG PO TABS: ORAL_TABLET | ORAL | 1 refills | Status: DC

## 2018-12-01 ENCOUNTER — Encounter: Payer: Self-pay | Admitting: Psychiatry

## 2018-12-01 NOTE — Progress Notes (Signed)
Follow-up with this patient with chronic mood instability and depression.  Spoke to her by telephone.  Patient has no new complaints.  She was pleasant and appropriate and says her mood is been fine.  Denies being depressed.  Denies any side effects from medicine.  No new physical problems.  She had her toe amputated a few months ago and healed up well from that.  Patient was appropriate in her interaction.  Thoughts appear lucid and appropriate.  Fully oriented.  No evidence of dangerousness.  Renew all of medicines after reviewing medication usage.  Supportive therapy.  Follow-up 6 months

## 2018-12-12 ENCOUNTER — Ambulatory Visit: Admit: 2018-12-12 | Discharge: 2018-12-18 | Disposition: A | Discharge status: Home / Self Care | Payer: MEDICAID

## 2018-12-12 ENCOUNTER — Encounter
Admit: 2018-12-12 | Discharge: 2018-12-18 | Disposition: A | Discharge status: Home / Self Care | Payer: MEDICAID | Attending: Student in an Organized Health Care Education/Training Program

## 2018-12-12 DIAGNOSIS — K610 Anal abscess: Principal | ICD-10-CM

## 2018-12-12 LAB — CBC W/ AUTO DIFF
BASOPHILS ABSOLUTE COUNT: 0.1 10*9/L (ref 0.0–0.1)
BASOPHILS RELATIVE PERCENT: 0.5 %
EOSINOPHILS ABSOLUTE COUNT: 0 10*9/L (ref 0.0–0.4)
EOSINOPHILS RELATIVE PERCENT: 0 %
HEMATOCRIT: 43.4 % (ref 36.0–46.0)
LARGE UNSTAINED CELLS: 1 % (ref 0–4)
LYMPHOCYTES RELATIVE PERCENT: 22.6 %
MEAN CORPUSCULAR HEMOGLOBIN CONC: 32.1 g/dL (ref 31.0–37.0)
MEAN CORPUSCULAR HEMOGLOBIN: 25.8 pg — ABNORMAL LOW (ref 26.0–34.0)
MEAN CORPUSCULAR VOLUME: 80.2 fL (ref 80.0–100.0)
MEAN PLATELET VOLUME: 9.2 fL (ref 7.0–10.0)
MONOCYTES ABSOLUTE COUNT: 0.4 10*9/L (ref 0.2–0.8)
MONOCYTES RELATIVE PERCENT: 3.3 %
NEUTROPHILS ABSOLUTE COUNT: 7.5 10*9/L (ref 2.0–7.5)
NEUTROPHILS RELATIVE PERCENT: 72.8 %
PLATELET COUNT: 246 10*9/L (ref 150–440)
RED BLOOD CELL COUNT: 5.41 10*12/L — ABNORMAL HIGH (ref 4.00–5.20)

## 2018-12-12 LAB — COMPREHENSIVE METABOLIC PANEL
ALBUMIN: 4.1 g/dL (ref 3.5–5.0)
ALKALINE PHOSPHATASE: 93 U/L (ref 38–126)
ALT (SGPT): 25 U/L (ref <35)
ANION GAP: 13 mmol/L (ref 7–15)
AST (SGOT): 28 U/L (ref 14–38)
BILIRUBIN TOTAL: 0.8 mg/dL (ref 0.0–1.2)
BLOOD UREA NITROGEN: 20 mg/dL (ref 7–21)
BUN / CREAT RATIO: 19
CALCIUM: 9.7 mg/dL (ref 8.5–10.2)
CHLORIDE: 97 mmol/L — ABNORMAL LOW (ref 98–107)
CO2: 24 mmol/L (ref 22.0–30.0)
CREATININE: 1.04 mg/dL — ABNORMAL HIGH (ref 0.60–1.00)
EGFR CKD-EPI AA FEMALE: 69 mL/min/{1.73_m2} (ref >=60)
EGFR CKD-EPI NON-AA FEMALE: 60 mL/min/{1.73_m2} (ref >=60)
GLUCOSE RANDOM: 248 mg/dL — ABNORMAL HIGH (ref 70–179)
POTASSIUM: 3.9 mmol/L (ref 3.5–5.0)
SODIUM: 134 mmol/L — ABNORMAL LOW (ref 135–145)

## 2018-12-12 LAB — C-REACTIVE PROTEIN: C reactive protein:MCnc:Pt:Ser/Plas:Qn:: 17.5 — ABNORMAL HIGH

## 2018-12-12 LAB — EGFR CKD-EPI NON-AA FEMALE: Lab: 60

## 2018-12-12 LAB — LACTATE BLOOD VENOUS: Lactate:SCnc:Pt:BldV:Qn:: 2.5 — ABNORMAL HIGH

## 2018-12-12 LAB — PROTIME: Lab: 11.5

## 2018-12-12 LAB — ERYTHROCYTE SEDIMENTATION RATE: Lab: 8

## 2018-12-12 LAB — MEAN CORPUSCULAR VOLUME: Lab: 80.2

## 2018-12-12 NOTE — Unmapped (Signed)
Patient has diabetic ulcers on her buttock that have been increasing in pain and patient has trouble sitting. This exacerbation has lasted for 2 days.

## 2018-12-13 DIAGNOSIS — K610 Anal abscess: Principal | ICD-10-CM

## 2018-12-13 LAB — URINALYSIS
KETONES UA: NEGATIVE
NITRITE UA: NEGATIVE
PH UA: 5 (ref 5.0–9.0)
PROTEIN UA: NEGATIVE
RBC UA: 12 /HPF — ABNORMAL HIGH (ref <=4)
SPECIFIC GRAVITY UA: 1.078 — ABNORMAL HIGH (ref 1.003–1.030)
SQUAMOUS EPITHELIAL: 2 /HPF (ref 0–5)
UROBILINOGEN UA: 0.2
WBC UA: 56 /HPF — ABNORMAL HIGH (ref 0–5)

## 2018-12-13 LAB — MEAN CORPUSCULAR VOLUME: Lab: 80.3

## 2018-12-13 LAB — CBC
HEMATOCRIT: 33.4 % — ABNORMAL LOW (ref 36.0–46.0)
HEMOGLOBIN: 10.9 g/dL — ABNORMAL LOW (ref 12.0–16.0)
MEAN CORPUSCULAR HEMOGLOBIN CONC: 32.7 g/dL (ref 31.0–37.0)
MEAN CORPUSCULAR VOLUME: 80.3 fL (ref 80.0–100.0)
MEAN PLATELET VOLUME: 8.3 fL (ref 7.0–10.0)
PLATELET COUNT: 163 10*9/L (ref 150–440)
RED BLOOD CELL COUNT: 4.16 10*12/L (ref 4.00–5.20)
WBC ADJUSTED: 7.2 10*9/L (ref 4.5–11.0)

## 2018-12-13 LAB — NITRITE UA: Lab: NEGATIVE

## 2018-12-13 NOTE — Unmapped (Addendum)
Amanda Hanson 58 y.o. 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      The patient was taken to the OR on 12/13/2018 for  EUA, I&D of perianal and labial abscess.  Operative findings were Gluteal/perianal abscess with large cavity; penrose drain placed. Labial abscess with small cavity; packing placed.  The procedure itself was uneventful and without complications. She tolerated the procedure well, was extubated in the OR, and received routine post-operative care before being transferred to the floor.    She was started on broad spectrum antibiotics on arrival (Vanc (8/26-8/28) and Zosyn (8/26- 8/29). Cultures grew with mixed gram neg and positive and she was transitioned to PO cipro and flaygl for an additional 10 day course of antibiotics. ID was e-consulted for abx assistance given her multiple drug allergies and recommended transitioning to cipro and flagyl for duration of abx course.  EKG obtained for baseline QTC which showed QTC of 447, fredericia of 427.  Discussed with patient and she reports she has tolerated cipro well in the past with no adverse effects, so will do 9 more days of antibiotics and then see in clinic for re-evaluation.      Medicine team consulted while inpatient to assist with DM management and med reconciliation, agreed with continuing all home meds and outpatient follow up.  Will continue steroid taper at discharge as directed by Rheumatology.    Urinalysis performed on admission with concern for UTI, urine culture showed no growth.   A foley was placed for urinary retention on 8/26. She underwent  TOV on 8/29, voided once, then required in and out cath x1.  On day of discharge she was able to void with low PVR, given strict f/u precautions.     Her diet was advanced and by discharge she was tolerating a regular diet. She was voiding adequately, ambulating independently, and her pain was controlled wth PO pain medications. She was examined by the Trauma Surgery team on the day of discharge and was deemed suitable for discharge home. She will be discharged on POD #4 in good condition.    I spent greater than 30 minutes completing this discharge.

## 2018-12-13 NOTE — Unmapped (Signed)
Vancomycin Therapeutic Monitoring Pharmacy Note    Amanda Hanson is a 58 y.o. female starting vancomycin. Date of therapy initiation: 12/12/2018    Indication: Skin and Soft Tissue Infection (SSTI) - Moderate/Severe    Prior Dosing Information: None/new initiation     Goals:  Therapeutic Drug Levels  Vancomycin trough goal: 15-20 mg/L    Additional Clinical Monitoring/Outcomes  Renal function, volume status (intake and output)    Results: Not applicable    Wt Readings from Last 1 Encounters:   11/17/18 (!) 148.8 kg (328 lb)     Creatinine   Date Value Ref Range Status   12/12/2018 1.04 (H) 0.60 - 1.00 mg/dL Final   16/01/9603 5.40 0.60 - 1.00 mg/dL Final   98/02/9146 8.29 0.60 - 1.00 mg/dL Final        Pharmacokinetic Considerations and Significant Drug Interactions:  ? Adult (estimated initial): Vd = 106 L, ke = 0.06 hr-1  ? Concurrent nephrotoxic meds: not applicable    Assessment/Plan:  Recommendation(s)  ? Start vancomycin 1.25G Q12h (Received 2.5G in ED as LD)  ? Estimated trough on recommended regimen: 16 mg/L    Follow-up  ? Level due: prior to fourth or fifth dose  ? A pharmacist will continue to monitor and order levels as appropriate    Please page service pharmacist with questions/clarifications.    Hollie Beach, PharmD

## 2018-12-13 NOTE — Unmapped (Signed)
Emergency Department Provider Note       Impression, ED Course, Assessment and Plan      Impression:   Amanda Hanson is a 58 y.o. female with a PMH of arthritis, CHF, T2DM, hypothyroidism, recurrent diabetic foot ulcers s/p multiple toe amputations, obesity, and anxiety presenting concern for developing cellulitis to buttocks and perineum starting approximately 3 days ago and gradually worsening in terms of patient's pain,  On exam, patient is uncomfortable appearing but in no acute distress. VS notable for borderline tachycardia and a low grade temp(99.16F), otherwise WNL. erythema to buttocks bilaterally, tenderness to perineum, mild labial swelling, extremely tender to palpation. No crepitus. Warm to touch. No fistulas visualized or obvious breaks in skin. Plan for sepsis workup. Will give zosyn and vancomycin in the event of necrotizing infection.     Clinical exam and patient story consistent with infection requiring IV antibiotics. Anticipate admission.    8:28 PM  Labs notable for hyperglycemia to 248, elevated CRP to 17.5 and lactate at 2.5 No leukocytosis. Sed rate 8. Pending CT A/P.      On my reevaluation of the patient at approximately 9 PM, wound edges have not spread, patient's pain controlled with morphine.  Vital signs remain stable.  No indication for emergent surgical consultation.  Suspect this is cellulitis, lower suspicion of necrotizing infection     10:44 PM  CT A/P results pending at signout.    I have reviewed the vital signs and the nursing notes.   Labs and radiology results that were available during my care of the patient were independently reviewed by me and considered in my medical decision making.     Vitals:    12/12/18 1614 12/12/18 1822 12/12/18 2200   BP: 116/90 122/88 123/75   Pulse: 100 98    Resp: 20 20    Temp: 37.5 ??C (99.5 ??F) 37.4 ??C (99.4 ??F)    TempSrc: Oral Oral    SpO2: 100% 100% 94%     CT Abdomen Pelvis with IV Contrast ONLY    (Results Pending)     Labs Reviewed   COMPREHENSIVE METABOLIC PANEL - Abnormal; Notable for the following components:       Result Value    Sodium 134 (*)     Chloride 97 (*)     Creatinine 1.04 (*)     Glucose 248 (*)     All other components within normal limits   C-REACTIVE PROTEIN - Abnormal; Notable for the following components:    CRP 17.5 (*)     All other components within normal limits   LACTATE, VENOUS, WHOLE BLOOD - Abnormal; Notable for the following components:    Lactate, Venous 2.5 (*)     All other components within normal limits   CBC W/ AUTO DIFF - Abnormal; Notable for the following components:    RBC 5.41 (*)     MCH 25.8 (*)     RDW 19.0 (*)     Neutrophil Left Shift 1+ (*)     Microcytosis Slight (*)     Anisocytosis Moderate (*)     Hypochromasia Slight (*)     All other components within normal limits    Narrative:     Please use the Absolute Differential for reference ranges.    SEDIMENTATION RATE, MANUAL - Normal   BLOOD CULTURE   BLOOD CULTURE   EXTRA TUBES    Narrative:     The following orders were created for panel  order ED Extra Tubes.                  Procedure                               Abnormality         Status                                     ---------                               -----------         ------                                     GREEN LITHIUM HEPARIN E.Marland KitchenMarland Kitchen[[id number]]                                                                 LAVENDER EDTA EXTRA [phone]                        Final result                               LIGHT BLUE CITRATE EXTR.Marland KitchenMarland Kitchen[[id number]]                      Final result                               GOLD SST EXTRA TUBE[[id number]]                             Final result                                                 Please view results for these tests on the individual orders.   CBC W/ DIFFERENTIAL    Narrative:     The following orders were created for panel order CBC w/ Differential.                  Procedure Abnormality         Status                                     ---------                               -----------         ------  CBC w/ Differential[[id number]]         Abnormal            Final result                                                 Please view results for these tests on the individual orders.   PROTIME-INR   TYPE AND SCREEN   LAVENDER EDTA EXTRA TUBE    Narrative:     Collected and received in lab.   LIGHT BLUE CITRATE EXTRA TUBE    Narrative:     Collected and received in lab.   GOLD SST EXTRA TUBE    Narrative:     Collected and received in lab.         History      Chief Complaint  Leg Ulcer Chronic    HPI   Amanda Hanson is a 58 y.o. female with a PMH of arthritis, CHF, T2DM, hypothyroidism, recurrent diabetic foot ulcers s/p multiple toe amputations, obesity, and anxiety who presents to the ED for buttock and perineal pain. Patient reports the onset of pain, redness and warmth on her buttocks three days ago with associated pain and warmth. She endorses a history of similar requiring admission for management. Per chart review patient has a history of uncontrolled diabetes, last A1C was 12. No history of DKA. Patient denies changes in PO intake, fever, shortness of breath, chills, dysuria, or any other medical concerns.  Normal bowel movements, tolerating p.o.  No nausea or vomiting.     Upon chart review, patient has ID follow up. She has a history of diabetic foot ulcer/osteomyelitis of the R great toe requiring amputation. Patient has a history of MRSA and bacterial infection susceptible to doxycyline, gentamicin, linezolid, bactrim, and vancomycin.    Past Medical History:   Diagnosis Date   ??? Abnormal mammogram    ??? Anxiety disorder    ??? Arthritis    ??? Congestive heart failure (CHF) (CMS-HCC)    ??? DDD (degenerative disc disease)    ??? Diabetes mellitus type 2 in obese (CMS-HCC) 2011   ??? Hypothyroidism    ??? Mixed incontinence urge and stress (female)(female)    ??? Obesity    ??? Other functional disorder of bladder    ??? Rheumatoid arthritis(714.0)    ??? Sleep apnea    ??? Urinary frequency    ??? Urinary obstruction, not elsewhere classified      Patient Active Problem List   Diagnosis   ??? Major depressive disorder, recurrent episode, moderate (CMS-HCC)   ??? Hypothyroidism   ??? Obesity   ??? Obstructive sleep apnea   ??? Rheumatoid arthritis flare (CMS-HCC)   ??? Overactive detrusor   ??? Mixed incontinence   ??? Hypokalemia due to loss of potassium   ??? Hypomagnesemia   ??? QT prolongation   ??? Type 2 diabetes mellitus with hyperglycemia (CMS-HCC)   ??? Anxiety   ??? GERD (gastroesophageal reflux disease)   ??? Neuropathy   ??? Abdominal wall hernia   ??? Adhesive capsulitis   ??? Mixed anxiety depressive disorder   ??? Bipolar disorder, unspecified (CMS-HCC)   ??? Chronic diastolic CHF (congestive heart failure), NYHA class 3 (CMS-HCC)   ??? COPD (chronic obstructive pulmonary disease) (CMS-HCC)   ??? Diastolic dysfunction   ??? Knee pain, bilateral   ??? Vitamin  D deficiency   ??? Chronic pain of both wrists   ??? Chronic pain syndrome   ??? Chronic cystitis   ??? Morbid (severe) obesity due to excess calories (CMS-HCC)   ??? Diabetic ulcer of heel (CMS-HCC)   ??? Perineal abscess   ??? Cellulitis   ??? Diabetic ulcer of toe of right foot associated with type 2 diabetes mellitus (CMS-HCC)   ??? Ulcer of left heel and midfoot with fat layer exposed (CMS-HCC)   ??? Decubitus ulcer of heel, bilateral   ??? Osteomyelitis due to type 2 diabetes mellitus (CMS-HCC)   ??? Subacute vulvitis   ??? MRSA (methicillin resistant Staphylococcus aureus) infection     Past Surgical History:   Procedure Laterality Date   ??? BREAST BIOPSY Left 5 15 2017    with clip   ??? CHOLECYSTECTOMY     ??? Cystoscopy with Hydrodistention      11/2004   ??? HERNIA REPAIR     ??? HYSTERECTOMY  2000   ??? Interstim Placement      02/2005   ??? LAPAROSCOPIC GASTRIC BANDING     ??? OOPHORECTOMY Bilateral 2000   ??? PR AMPUTATION METATARSAL+TOE,SINGLE Right 08/24/2018 Procedure: PRIORITY 2nd ray amputation;  Surgeon: Karen Chafe, DPM;  Location: MAIN OR [facility];  Service: Vascular   ??? PR AMPUTATION TOE,MT-P JT Right 02/23/2018    Procedure: AMPUTATION, TOE; METATARSOPHALANGEAL JOINT;  Surgeon: Karen Chafe, DPM;  Location: MAIN OR [facility];  Service: Vascular   ??? PR AMPUTATION TOE,MT-P JT Right 08/02/2018    Procedure: AMPUTATION, TOE; METATARSOPHALANGEAL JOINT;  Surgeon: Webb Silversmith, MD;  Location: MAIN [facility];  Service: Vascular   ??? PR DEBRIDEMENT, SKIN, SUB-Q TISSUE,=<20 SQ CM Right 11/15/2017    Procedure: Debridement of right groin/perineal wound;  Surgeon: Joanie Coddington, MD;  Location: MAIN OR [facility];  Service: Trauma   ??? PR DEBRIDEMENT, SKIN, SUB-Q TISSUE,MUSCLE,BONE,=<20 SQ CM Right 02/28/2018    Procedure: DEBRIDEMENT; SKIN, SUBCUTANEOUS TISSUE, MUSCLE, & BONE FOOT;  Surgeon: Karen Chafe, DPM;  Location: MAIN OR [facility];  Service: Vascular   ??? PR DEBRIDEMENT, SKIN, SUB-Q TISSUE,MUSCLE,BONE,=<20 SQ CM Right 03/28/2018    Procedure: DEBRIDEMENT; SKIN, SUBCUTANEOUS TISSUE, MUSCLE, & BONE FOOT;  Surgeon: Karen Chafe, DPM;  Location: MAIN [facility];  Service: Vascular   ??? PR FULL EXCIS 1ST METATARSAL HEAD Right 03/28/2018    Procedure: OSTECTOMY COMPLT EXC; FIRST METATARSAL HEAD;  Surgeon: Karen Chafe, DPM;  Location: MAIN OR [facility];  Service: Vascular   ??? PR INCIS/DRAIN THIGH/KNEE ABSCESS,DEEP Left 06/14/2018    Procedure: Deep thigh abscess drainage;  Surgeon: Suella Broad, MD;  Location: MAIN OR [facility];  Service: Trauma   ??? PR SECD CLOS SURG WND EXTEN/COMPLIC Right 02/28/2018    Procedure: SECONDARY CLOSURE OF SURGICAL WOUND OR DEHISCENCE, EXTENSIVE OR COMPLICATED;  Surgeon: Karen Chafe, DPM;  Location: MAIN OR [facility];  Service: Vascular       Current Facility-Administered Medications:   ???  vancomycin (VANCOCIN) 2,500 mg in sodium chloride (NS) 0.9 % 500 mL IVPB, 2,500 mg, Intravenous, Once, Ellard Artis, MD, Last Rate: 226 mL/hr at 12/12/18 2045, 2,500 mg at 12/12/18 2045    Current Outpatient Medications:   ???  acetaminophen (TYLENOL) 500 MG tablet, Take 500 mg by mouth Three (3) times a day as needed for pain., Disp: , Rfl:   ???  ARIPiprazole (ABILIFY) 5 MG tablet, Take 5 mg by mouth daily., Disp: , Rfl:   ???  atorvastatin (LIPITOR) 80 MG tablet, Take 1 tablet (80 mg total) by mouth every evening., Disp: 90 tablet, Rfl: 1  ???  blood sugar diagnostic (ACCU-CHEK GUIDE) Strp, by Other route Four (4) times a day (before meals and nightly). Acuu-chek Guide., Disp: 120 each, Rfl: 5  ???  busPIRone (BUSPAR) 10 MG tablet, Take 10 mg by mouth Two (2) times a day. , Disp: , Rfl:   ???  calcium-vitamin D 250-100 mg-unit per tablet, Take 1 tablet by mouth Two (2) times a day., Disp: 60 tablet, Rfl: 11  ???  diclofenac sodium (VOLTAREN) 1 % gel, Apply 2 g topically nightly., Disp: , Rfl:   ???  empty container Misc, Use as directed, Disp: 1 each, Rfl: PRN  ???  FLUoxetine (PROZAC) 20 MG capsule, Take 20 mg by mouth daily. , Disp: , Rfl:   ???  folic acid (FOLVITE) 1 MG tablet, Take 1 tablet (1 mg total) by mouth daily., Disp: 90 tablet, Rfl: 3  ???  insulin glargine (LANTUS U-100 INSULIN) 100 unit/mL injection, Inject 0.65 mL (65 Units total) under the skin daily., Disp: 19.5 mL, Rfl: 0  ???  insulin lispro (HUMALOG U-100 INSULIN) 100 unit/mL injection, INJECT 0.20ML (20 UNITS TOTAL) UNDER THE SKIN THREE TIMES A DAY BEFORE MEALS., Disp: 50 mL, Rfl: 1  ???  insulin syringes, disposable, 1 mL Syrg, 100 Units by Miscellaneous route daily. Insulin injecting diabetes 250.00 Inject daily 100 unit syringe (1ml), Disp: 100 each, Rfl: 3  ???  levothyroxine (SYNTHROID) 88 MCG tablet, TAKE 1 TABLET BY MOUTH EVERY DAY, Disp: 90 tablet, Rfl: 1  ???  lisinopriL (PRINIVIL,ZESTRIL) 2.5 MG tablet, Take 1 tablet (2.5 mg total) by mouth daily., Disp: 90 tablet, Rfl: 3  ???  LYRICA 150 mg capsule, Take 150 mg by mouth Three (3) times a day., Disp: , Rfl: 0  ???  melatonin 3 mg Tab, Take 1 tablet (3 mg total) by mouth nightly as needed. (Patient not taking: Reported on 11/17/2018), Disp: , Rfl: 0  ???  metFORMIN (GLUCOPHAGE) 500 MG tablet, Take 2 tablets (1,000 mg total) by mouth 2 (two) times a day with meals., Disp: 360 tablet, Rfl: 3  ???  methotrexate 25 mg/mL injection solution, Inject 1 mL (25 mg total) under the skin once a week., Disp: 10 mL, Rfl: 3  ???  nystatin (MYCOSTATIN) 100,000 unit/gram powder, Apply 1 application topically 4 (four) times a day as needed., Disp: , Rfl:   ???  oxyCODONE (ROXICODONE) 5 MG immediate release tablet, , Disp: , Rfl:   ???  polyethylene glycol (MIRALAX) 17 gram packet, Take 17 g by mouth daily. (Patient not taking: Reported on 11/07/2018), Disp: 30 packet, Rfl: 0  ???  predniSONE (DELTASONE) 5 MG tablet, Take 20 mg daily x 2 weeks, 15 mg daily x 2 weeks, 10 mg daily x 2 weeks, then 5 mg until visit with rheumatology, Disp: 182 tablet, Rfl: 1  ???  promethazine (PHENERGAN) 25 MG tablet, Take 25 mg by mouth every six (6) hours as needed for nausea. Indications: nausea and vomiting after surgery, Disp: , Rfl:   ???  QUEtiapine (SEROQUEL) 300 MG tablet, Take 300 mg by mouth nightly., Disp: , Rfl:   ???  senna (SENOKOT) 8.6 mg tablet, Take 2 tablets by mouth nightly as needed for constipation. (Patient not taking: Reported on 11/07/2018), Disp: 60 tablet, Rfl: 0  ???  solifenacin (VESICARE) 5 MG tablet, Take 1 tablet (5 mg total) by mouth daily. (Patient not taking:  Reported on 11/17/2018), Disp: 30 tablet, Rfl: 12  ???  tocilizumab (ACTEMRA ACTPEN) 162 mg/0.9 mL PnIj, Inject the contents of 1 pen (162 mg) under the skin every seven (7) days., Disp: 10.8 mL, Rfl: 3  ???  zolpidem (AMBIEN) 5 MG tablet, Take 5 mg by mouth nightly as needed for sleep., Disp: , Rfl:      Allergies  Cephalexin, Sulfa (sulfonamide antibiotics), Codeine, Darvocet a500  [propoxyphene n-acetaminophen], Hydrocodone, Meropenem, and Sulfasalazine    Family History Problem Relation Age of Onset   ??? Cancer Maternal Grandmother         Lung cancer   ??? Diabetes Maternal Grandmother    ??? Rheum arthritis Sister    ??? GU problems Neg Hx    ??? Kidney cancer Neg Hx    ??? Prostate cancer Neg Hx      Social History  Social History     Tobacco Use   ??? Smoking status: Former Smoker     Packs/day: 2.00     Years: 27.00     Pack years: 54.00     Types: Cigarettes     Quit date: 2003     Years since quitting: 17.6   ??? Smokeless tobacco: Former Neurosurgeon     Quit date: 2003   ??? Tobacco comment: Started smoking at 51, quit 2003.    Substance Use Topics   ??? Alcohol use: Yes     Alcohol/week: 7.0 standard drinks     Types: 7 Cans of beer per week     Binge frequency: Less than monthly   ??? Drug use: No     Review of Systems  Constitutional: Negative for fever.  Eyes: Negative for visual changes.  ENT: Negative for sore throat.  Cardiovascular: Negative for chest pain.  Respiratory: Negative for shortness of breath.  Gastrointestinal: Negative for abdominal pain, vomiting or diarrhea.  Genitourinary: Positive for peritoneal and buttock pain  Musculoskeletal: Negative for back pain.  Skin: Positive for diabetic ulcers to buttocks.  Neurological: Negative for headaches, focal weakness or numbness.  Psychiatric: Appropriate mood and affect.     All other systems have been reviewed and are negative except as otherwise documented     Physical Exam     Constitutional: Alert and oriented.  Uncomfortable secondary to pain  Eyes: Conjunctivae are normal.  ENT       Head: Normocephalic and atraumatic.       Nose: No congestion.       Mouth/Throat: Mucous membranes are moist.       Neck: No stridor.  No midline tenderness  Hematological/Lymphatic/Immunilogical: No cervical lymphadenopathy.  Cardiovascular: Normal rate, regular rhythm. Normal and symmetric distal pulses are present in all extremities.  Respiratory: Normal respiratory effort. Breath sounds are normal.  Gastrointestinal: Soft and nontender. There is no CVA tenderness.  Genitourinary: erythematous area localized to buttocks and perineum, tenderness to perineum, mild labial swelling, tender to palpation. No crepitus. Warm to touch. No fistulas visualized or obvious breaks in skin.  No bullae or necrotic skin  Musculoskeletal: Normal range of motion in all extremities.  No step-offs on palpation of spine.        Right lower leg: No tenderness or edema.       Left lower leg: No tenderness or edema.  Neurologic: Normal speech and language. No gross focal neurologic deficits are appreciated.  Skin: See genitourinary. Skin is otherwise warm, dry and intact. No rash noted.  Psychiatric: Mood and affect are normal.  Speech and behavior are normal.  ______________________________________________________   Documentation assistance was provided by Lorie Apley, Scribe, on December 12, 2018 at 6:09 PM for [facility], DO.    Documentation assistance was provided by the scribe in my presence.  The documentation recorded by the scribe has been reviewed by me and accurately reflects the services I personally performed.    Portions of this record have been created using Scientist, clinical (histocompatibility and immunogenetics). Dictation errors have been sought, but may not have been identified and corrected.  ____________________________________________     Ellard Artis, MD  Resident  12/12/18 [phone]
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
## 2018-12-13 NOTE — Unmapped (Signed)
SURGERY HISTORY & PHYSICAL    Service Date: 12/13/2018  Admit Date: 12/12/2018  Patient Hospital Service: General Surgery  Attending: Dr.Jemmie Rhinehart  Patient Location: Emergency Department    Assessment & Plan     Amanda Hanson is a 58 y.o. female with PMHx of arthritis, CHF, hypothyroidism, T2DM, recurrent diabetic foot ulcers s/p multiple toe amputations, obesity, anxiety, and hx of recurrent abscess of the buttocks presenting with three days of gradually worsening pain caused by lesions located between her medial buttocks and perineum with associated erythema and induration concerning for perirectal abscess.    ?? Admit to General Surgery Service, for treatment to include perirectal abscess drainage in OR given that patient is unlikely to tolerate abscess drainage at bedside.  ?? Medications, to include Morphine IV, and PRN tylenol, oxycodone, and IV Dilaudid   ?? IV antibiotics: Zosyn and Vancomycin   ?? Diet: NPO No Exceptions; Operating room.  ?? IV Fluids: LR at 150 mL/hr.   ?? Will obtain labs to include CBC, CMP, Blood culture   ?? Patient will be started on prophylactic medications: Evaro Heparin  ?? Consults: N/A           Chief Complaint: Pain and erythema x 3 days of buttocks and perineum.     History of Present Illness:  Amanda Hanson is a 58 y.o. female with PMHx of arthritis, CHF, hypothyroidism, T2DM, recurrent diabetic foot ulcers s/p multiple toe amputations, obesity, anxiety, and abscess of the buttocks presenting with three days of gradually worsening pain caused by lesions located between her medial buttocks and perineum with associated erythema, induration, drainage. Pt denies any recent fever, chills, nausea, or vomiting but does mention two previous episodes of similar symptoms requiring surgical incision and drainage/ulcer debridement. Pt very uncomfortable and in pain during physical exam, exacerbated with palpation of lesions and with sitting on buttocks.     In the ED, pt's VS are notable for borderline tachycardia and a low grade temperature of 99.5 but are otherwise wnl. Her CBC is unremarkable with WBC count of 10.3. CMP remarkable for glucose of 248. CRP and lactate also elevated at 17.5 and 2.5 respectively. Sed rate 8. Cultures are pending with wound margins not rapidly advancing - pt started on Zosyn and vancomycin. CT abdomen pelvis w/contrast negative for evidence of gas or infection in soft tissue of the pelvis.     Personal Medical History:   Past Medical History:   Diagnosis Date   ??? Abnormal mammogram    ??? Anxiety disorder    ??? Arthritis    ??? Congestive heart failure (CHF) (CMS-HCC)    ??? DDD (degenerative disc disease)    ??? Diabetes mellitus type 2 in obese (CMS-HCC) 2011   ??? Hypothyroidism    ??? Mixed incontinence urge and stress (female)(female)    ??? Obesity    ??? Other functional disorder of bladder    ??? Rheumatoid arthritis(714.0)    ??? Sleep apnea    ??? Urinary frequency    ??? Urinary obstruction, not elsewhere classified      Personal Surgical History:   Past Surgical History:   Procedure Laterality Date   ??? BREAST BIOPSY Left 5 15 2017    with clip   ??? CHOLECYSTECTOMY     ??? Cystoscopy with Hydrodistention      11/2004   ??? HERNIA REPAIR     ??? HYSTERECTOMY  2000   ??? Interstim Placement      02/2005   ???  LAPAROSCOPIC GASTRIC BANDING     ??? OOPHORECTOMY Bilateral 2000   ??? PR AMPUTATION METATARSAL+TOE,SINGLE Right 08/24/2018    Procedure: PRIORITY 2nd ray amputation;  Surgeon: Karen Chafe, DPM;  Location: MAIN OR [facility];  Service: Vascular   ??? PR AMPUTATION TOE,MT-P JT Right 02/23/2018    Procedure: AMPUTATION, TOE; METATARSOPHALANGEAL JOINT;  Surgeon: Karen Chafe, DPM;  Location: MAIN OR [facility];  Service: Vascular   ??? PR AMPUTATION TOE,MT-P JT Right 08/02/2018    Procedure: AMPUTATION, TOE; METATARSOPHALANGEAL JOINT;  Surgeon: Webb Silversmith, MD;  Location: MAIN OR [facility];  Service: Vascular   ??? PR DEBRIDEMENT, SKIN, SUB-Q TISSUE,=<20 SQ CM Right 11/15/2017 Procedure: Debridement of right groin/perineal wound;  Surgeon: Joanie Coddington, MD;  Location: MAIN OR [facility];  Service: Trauma   ??? PR DEBRIDEMENT, SKIN, SUB-Q TISSUE,MUSCLE,BONE,=<20 SQ CM Right 02/28/2018    Procedure: DEBRIDEMENT; SKIN, SUBCUTANEOUS TISSUE, MUSCLE, & BONE FOOT;  Surgeon: Karen Chafe, DPM;  Location: MAIN OR [facility];  Service: Vascular   ??? PR DEBRIDEMENT, SKIN, SUB-Q TISSUE,MUSCLE,BONE,=<20 SQ CM Right 03/28/2018    Procedure: DEBRIDEMENT; SKIN, SUBCUTANEOUS TISSUE, MUSCLE, & BONE FOOT;  Surgeon: Karen Chafe, DPM;  Location: MAIN OR [facility];  Service: Vascular   ??? PR FULL EXCIS 1ST METATARSAL HEAD Right 03/28/2018    Procedure: OSTECTOMY COMPLT EXC; FIRST METATARSAL HEAD;  Surgeon: Karen Chafe, DPM;  Location: MAIN OR [facility];  Service: Vascular   ??? PR INCIS/DRAIN THIGH/KNEE ABSCESS,DEEP Left 06/14/2018    Procedure: Deep thigh abscess drainage;  Surgeon: Suella Broad, MD;  Location: MAIN OR [facility];  Service: Trauma   ??? PR SECD CLOS SURG WND EXTEN/COMPLIC Right 02/28/2018    Procedure: SECONDARY CLOSURE OF SURGICAL WOUND OR DEHISCENCE, EXTENSIVE OR COMPLICATED;  Surgeon: Karen Chafe, DPM;  Location: MAIN OR [facility];  Service: Vascular     Social History:   Tobacco use: former smoker (quit in 2003 after 54 pack years)  Alcohol use: occassional use  Drug use: denies    Family History:   Patient denies any family history of coagulopathy or difficulty under anesthesia.    Allergies: Cephalexin, Sulfa (sulfonamide antibiotics), Codeine, Darvocet a500  [propoxyphene n-acetaminophen], Hydrocodone, Meropenem, and Sulfasalazine    Medications:   Current Facility-Administered Medications   Medication Dose Route Frequency Provider Last Rate Last Dose   ??? acetaminophen (TYLENOL) tablet 650 mg  650 mg Oral Q6H PRN Tiajuana Amass, MD       ??? famotidine (PF) (PEPCID) injection 20 mg  20 mg Intravenous BID Tiajuana Amass, MD       ??? heparin (porcine) 7,500 units/0.75 mL syringe  7,500 Units Subcutaneous Q8H [facility] Tiajuana Amass, MD       ??? HYDROmorphone (PF) (DILAUDID) injection 1 mg  1 mg Intravenous Q4H PRN Tiajuana Amass, MD       ??? lactated Ringers infusion  150 mL/hr Intravenous Continuous Tiajuana Amass, MD       ??? MORPhine 4 mg/mL injection 4 mg  4 mg Intravenous Once Barrett Shell, MD       ??? ondansetron (ZOFRAN-ODT) disintegrating tablet 4 mg  4 mg Oral Q8H PRN Tiajuana Amass, MD       ??? oxyCODONE-acetaminophen (PERCOCET) 5-325 mg tablet 1 tablet  1 tablet Oral Q6H PRN Tiajuana Amass, MD       ??? piperacillin-tazobactam (ZOSYN) IVPB (premix) 3.375 g  3.375 g Intravenous Q8H Tiajuana Amass, MD       ???  vancomycin (VANCOCIN) 1500 mg in sodium chloride (NS) 0.9 % 500 mL IVPB (premix)  1,500 mg Intravenous Q12H Tiajuana Amass, MD         Current Outpatient Medications   Medication Sig Dispense Refill   ??? acetaminophen (TYLENOL) 500 MG tablet Take 500 mg by mouth Three (3) times a day as needed for pain.     ??? ARIPiprazole (ABILIFY) 5 MG tablet Take 5 mg by mouth daily.     ??? atorvastatin (LIPITOR) 80 MG tablet Take 1 tablet (80 mg total) by mouth every evening. 90 tablet 1   ??? blood sugar diagnostic (ACCU-CHEK GUIDE) Strp by Other route Four (4) times a day (before meals and nightly). Acuu-chek Guide. 120 each 5   ??? busPIRone (BUSPAR) 10 MG tablet Take 10 mg by mouth Two (2) times a day.      ??? calcium-vitamin D 250-100 mg-unit per tablet Take 1 tablet by mouth Two (2) times a day. 60 tablet 11   ??? diclofenac sodium (VOLTAREN) 1 % gel Apply 2 g topically nightly.     ??? empty container Misc Use as directed 1 each PRN   ??? FLUoxetine (PROZAC) 20 MG capsule Take 20 mg by mouth daily.      ??? folic acid (FOLVITE) 1 MG tablet Take 1 tablet (1 mg total) by mouth daily. 90 tablet 3   ??? insulin glargine (LANTUS U-100 INSULIN) 100 unit/mL injection Inject 0.65 mL (65 Units total) under the skin daily. 19.5 mL 0   ??? insulin lispro (HUMALOG U-100 INSULIN) 100 unit/mL injection INJECT 0.20ML (20 UNITS TOTAL) UNDER THE SKIN THREE TIMES A DAY BEFORE MEALS. 50 mL 1   ??? insulin syringes, disposable, 1 mL Syrg 100 Units by Miscellaneous route daily. Insulin injecting diabetes 250.00  Inject daily  100 unit syringe (1ml) 100 each 3   ??? levothyroxine (SYNTHROID) 88 MCG tablet TAKE 1 TABLET BY MOUTH EVERY DAY 90 tablet 1   ??? lisinopriL (PRINIVIL,ZESTRIL) 2.5 MG tablet Take 1 tablet (2.5 mg total) by mouth daily. 90 tablet 3   ??? LYRICA 150 mg capsule Take 150 mg by mouth Three (3) times a day.  0   ??? melatonin 3 mg Tab Take 1 tablet (3 mg total) by mouth nightly as needed. (Patient not taking: Reported on 11/17/2018)  0   ??? metFORMIN (GLUCOPHAGE) 500 MG tablet Take 2 tablets (1,000 mg total) by mouth 2 (two) times a day with meals. 360 tablet 3   ??? methotrexate 25 mg/mL injection solution Inject 1 mL (25 mg total) under the skin once a week. 10 mL 3   ??? nystatin (MYCOSTATIN) 100,000 unit/gram powder Apply 1 application topically 4 (four) times a day as needed.     ??? oxyCODONE (ROXICODONE) 5 MG immediate release tablet      ??? polyethylene glycol (MIRALAX) 17 gram packet Take 17 g by mouth daily. (Patient not taking: Reported on 11/07/2018) 30 packet 0   ??? predniSONE (DELTASONE) 5 MG tablet Take 20 mg daily x 2 weeks, 15 mg daily x 2 weeks, 10 mg daily x 2 weeks, then 5 mg until visit with rheumatology 182 tablet 1   ??? promethazine (PHENERGAN) 25 MG tablet Take 25 mg by mouth every six (6) hours as needed for nausea. Indications: nausea and vomiting after surgery     ??? QUEtiapine (SEROQUEL) 300 MG tablet Take 300 mg by mouth nightly.     ??? senna (SENOKOT) 8.6 mg tablet Take 2  tablets by mouth nightly as needed for constipation. (Patient not taking: Reported on 11/07/2018) 60 tablet 0   ??? solifenacin (VESICARE) 5 MG tablet Take 1 tablet (5 mg total) by mouth daily. (Patient not taking: Reported on 11/17/2018) 30 tablet 12   ??? tocilizumab (ACTEMRA ACTPEN) 162 mg/0.9 mL PnIj Inject the contents of 1 pen (162 mg) under the skin every seven (7) days. 10.8 mL 3   ??? zolpidem (AMBIEN) 5 MG tablet Take 5 mg by mouth nightly as needed for sleep.         Review of Systems:  The balance of more than 10 systems reviewed were negative except as noted in the HPI.           Physical Examination:  Vitals:  Patient Vitals for the past 8 hrs:   BP Temp Temp src Pulse SpO2 Pulse Resp SpO2   12/12/18 2230 130/74 ??? ??? ??? ??? ??? ???   12/12/18 2200 123/75 ??? ??? ??? 88 ??? 94 %   12/12/18 1822 122/88 37.4 ??C (99.4 ??F) Oral 98 ??? 20 100 %       General: Well-developed, uncomfortable and in distress 2/2 pain.    Neurological: Alert and oriented to person, place, and time.     HEENT:  -Head/Face: Normocephalic without trauma.  EOMI.  Face symmetric.   -Oral Cavity/Oropharynx: Mucosa pink and well hydrated.    -Neck: Supple. Trachea midline.     Cardiovascular: Regular rate and rhythm without murmurs, rubs, or gallops.      Pulmonary: Lungs clear to auscultation without wheezes, rales, or rhonchi. Normal work of breathing.     Abdomen: Soft, non-tender, non-distended.  No rebound or guarding.     Genitourinary: Erythematous, indurated and tense lesion suspicious for abscess localized to left medial buttocks and perineum with exquisite tenderness to palpation. Mild labial swelling with ulceration noted on right labia. Warm to touch. No crepitus. No fistulas visualized. No bullae or necrotic skin.     Extremities: Warm, well perfused. No cyanosis, clubbing or edema. 2+ RP.      Pertinent Diagnostic Tests:  All lab results last 24 hours:    Recent Results (from the past 24 hour(s))   Comprehensive Metabolic Panel    Collection Time: 12/12/18  6:14 PM   Result Value Ref Range    Sodium 134 (L) 135 - 145 mmol/L    Potassium 3.9 3.5 - 5.0 mmol/L    Chloride 97 (L) 98 - 107 mmol/L    Anion Gap 13 7 - 15 mmol/L    CO2 24.0 22.0 - 30.0 mmol/L    BUN 20 7 - 21 mg/dL    Creatinine 9.60 (H) 0.60 - 1.00 mg/dL    BUN/Creatinine Ratio 19     EGFR CKD-EPI Non-African American, Female 60 >=60 mL/min/1.34m2    EGFR CKD-EPI African American, Female 52 >=60 mL/min/1.28m2    Glucose 248 (H) 70 - 179 mg/dL    Calcium 9.7 8.5 - 45.4 mg/dL    Albumin 4.1 3.5 - 5.0 g/dL    Total Protein 7.4 6.5 - 8.3 g/dL    Total Bilirubin 0.8 0.0 - 1.2 mg/dL    AST 28 14 - 38 U/L    ALT 25 <35 U/L    Alkaline Phosphatase 93 38 - 126 U/L   PT-INR    Collection Time: 12/12/18  6:14 PM   Result Value Ref Range    PT 11.5 10.2 - 13.1 sec    INR 1.00  Sedimentation rate, manual    Collection Time: 12/12/18  6:14 PM   Result Value Ref Range    Sed Rate 8 0 - 30 mm/h   C-reactive protein    Collection Time: 12/12/18  6:14 PM   Result Value Ref Range    CRP 17.5 (H) <10.0 mg/L   CBC w/ Differential    Collection Time: 12/12/18  6:14 PM   Result Value Ref Range    WBC 10.3 4.5 - 11.0 10*9/L    RBC 5.41 (H) 4.00 - 5.20 10*12/L    HGB 14.0 12.0 - 16.0 g/dL    HCT 29.5 62.1 - 30.8 %    MCV 80.2 80.0 - 100.0 fL    MCH 25.8 (L) 26.0 - 34.0 pg    MCHC 32.1 31.0 - 37.0 g/dL    RDW 65.7 (H) 84.6 - 15.0 %    MPV 9.2 7.0 - 10.0 fL    Platelet 246 150 - 440 10*9/L    Neutrophils % 72.8 %    Lymphocytes % 22.6 %    Monocytes % 3.3 %    Eosinophils % 0.0 %    Basophils % 0.5 %    Neutrophil Left Shift 1+ (A) Not Present    Absolute Neutrophils 7.5 2.0 - 7.5 10*9/L    Absolute Lymphocytes 2.3 1.5 - 5.0 10*9/L    Absolute Monocytes 0.4 0.2 - 0.8 10*9/L    Absolute Eosinophils 0.0 0.0 - 0.4 10*9/L    Absolute Basophils 0.1 0.0 - 0.1 10*9/L    Large Unstained Cells 1 0 - 4 %    Microcytosis Slight (A) Not Present    Anisocytosis Moderate (A) Not Present    Hypochromasia Slight (A) Not Present   Type and Screen    Collection Time: 12/12/18  7:21 PM   Result Value Ref Range    ABO Grouping A POS     Antibody Screen NEG    Lactate, Venous, Whole Blood    Collection Time: 12/12/18  7:21 PM   Result Value Ref Range    Lactate, Venous 2.5 (H) 0.5 - 1.8 mmol/L       Imaging: Radiology studies were personally reviewed      Case was discussed with Dr. Laural Benes who are in agreement with the above assessment and plan.    Gonzella Lex, MS3     I attest that I have reviewed the student note and that the components of the history of the present illness, the physical exam, and the assessment and plan documented were performed by me or were performed in my presence by the student where I verified the documentation and performed (or re-performed) the exam and medical decision making.    Tiajuana Amass  PGY2 General Surgery Resident  Pager [phone]      ATTENDING ATTESTATION:  I was present with resident during the history and exam. I discussed the findings, assessment and plan with the resident and agree with the findings and plan as documented in the resident??s note.  ________________________________

## 2018-12-13 NOTE — Unmapped (Signed)
ED Progress Note    Received sign out from Dr. Tildon Husky    ED I-PASS Handoff  ?? Illness Severit: stable  ?? Patient Summary: Amanda Hanson is a 58 y.o. female with PMH of CHF, T2DM, hypothyroid, arthritis, and recurrent diabetic foot ulcers here for cellulitis in the perineal region that has been progressive over the past 3 days.  Physical exam is notable for erythema to the buttocks bilaterally, tenderness to the perineum, and mild labial swelling with extreme tenderness to palpation.  Initial concern for cellulitis versus necrotizing fasciitis.  Slightly tachycardic on arrival with a temp of 99.5.  Cultures obtained, getting Zosyn and vancomycin.  Wound margins are not rapidly advancing.  ?? Action List: Follow-up CT scan  ?? Situation Awareness (Contingency Planning): Anticipate admission.   ?? Synthesis by Receiver    11:59 PM  CT scan was negative for evidence of abscess or gas. On my initial exam, the patient does have significant tenderness throughout the perineal region.  There is a small ulcer over the right Bartholin's gland that is significantly tender, unable to assess for fluctuance given the level of discomfort.    Spoke with surgery and they will come and evaluate the patient.    00:30  Surgery has evaluated the patient and plan to admit for drainage of a perirectal abscess in the OR.      CT Abdomen Pelvis with IV Contrast ONLY   Preliminary Result      --No soft tissue gas or evidence of infection in the soft tissues of the pelvis. The previously described inflammatory changes along the perineum and medial right upper thigh are not demonstrated on this study as the field of view does not extend inferiorly. If there is further concern, recommend correlation with physical exam and CT of the lower extremity as clinically warranted.      --Question mild circumferential bladder wall thickening with mild stranding anterior to the bladder. Consider correlation with urinalysis.      --Unchanged fat and fluid-containing ventral hernias as described above.      --Persistent borderline splenomegaly. Additional chronic/incidental findings as described above.      --Subtle foci of groundglass opacification along the dependent aspect of the right lower lobe, which may represent inflammation and/or aspiration/infection.

## 2018-12-13 NOTE — Unmapped (Signed)
General Surgery Operative Note    Date of Surgery: 12/13/2018  Admit Date: 12/12/2018  Performing Service: Trauma  Surgeon(s) and Role:     * Joanie Coddington, MD - Primary     * Daiva Eves Richardean Sale, MD - Resident - Observing      Pre-op Diagnosis: Perianal abscess; labial abscess    Post-op Diagnosis: Same    Procedure performed:  1. Examination under anesthesia  2. Incision and drainage perianal abscess  3. Incision and drainage labial abscess    Anesthesia: General    Estimated Blood Loss: 10 cc    Complications: None    Specimens: Culture - wound - perianal    Findings: Gluteal/perianal abscess with large cavity; penrose drain placed. Labial abscess with small cavity; packing placed    Procedure: The pt was taken to the OR and placed in the supine position. Timeout was  Performed. Prophylactic antibiotics were administered and SCDs were placed. General anesthesia was induced and the patient was then positioned in lithotomy. She wasprepped and draped in the usual sterile fashion. We performed an exam under anesthesia. Digital rectal exam was normal. The anal speculum was inserted and the mucosa inspected. Though there seemed to be an irregularity high on the left lateral side, no definitive fistulous tract was identified. We removed the speculum. We found the gluteal abscess and incised it. Purulent fluid was evacuated and a large cavity was entered. Sample was sent for culture. Loculations were broken up bluntly. A counter incision was made and a penrose drain was placed. It was secured with nylon suture. We then evaluated the labial abscess. It was incised and purulent fluid was drained. A small cavity was entered. Loculations were broken up bluntly. Hemostasis achieved at all sites. All incisions were copiously irrigated. The labial cavity was packed with plain iodoform. Dry gauze was applied and ABD pads as well. Mesh underwear was placed. The patient awoke and was extubated. She was transferred to PACU in stable condition.     Instrument/lap counts correct.    Surgeon Notes: I was present and scrubbed for the entire procedure    Fredrik Rigger MD    Date: 12/13/2018  Time: 12:09 PM

## 2018-12-13 NOTE — Unmapped (Signed)
.  Pt rounds complete. The following needs have been addressed: stretcher low and locked, call light within reach, pt belongings addressed, toileting addressed. Pt in NAD, family at bedside. No needs or concerns at this time.

## 2018-12-14 LAB — CBC
HEMATOCRIT: 36.7 % (ref 36.0–46.0)
HEMOGLOBIN: 11.6 g/dL — ABNORMAL LOW (ref 12.0–16.0)
MEAN CORPUSCULAR HEMOGLOBIN CONC: 31.6 g/dL (ref 31.0–37.0)
MEAN CORPUSCULAR HEMOGLOBIN: 25.5 pg — ABNORMAL LOW (ref 26.0–34.0)
MEAN CORPUSCULAR VOLUME: 80.8 fL (ref 80.0–100.0)
MEAN PLATELET VOLUME: 7.8 fL (ref 7.0–10.0)
RED BLOOD CELL COUNT: 4.55 10*12/L (ref 4.00–5.20)
RED CELL DISTRIBUTION WIDTH: 18.2 % — ABNORMAL HIGH (ref 12.0–15.0)
WBC ADJUSTED: 7.9 10*9/L (ref 4.5–11.0)

## 2018-12-14 LAB — PROTIME: Lab: 12.4

## 2018-12-14 LAB — PHOSPHORUS: Phosphate:MCnc:Pt:Ser/Plas:Qn:: 3.4

## 2018-12-14 LAB — HEMOGLOBIN A1C
ESTIMATED AVERAGE GLUCOSE: 338 mg/dL
Hemoglobin A1c/Hemoglobin.total:MFr:Pt:Bld:Qn:: 13.4 — ABNORMAL HIGH

## 2018-12-14 LAB — BASIC METABOLIC PANEL
ANION GAP: 6 mmol/L — ABNORMAL LOW (ref 7–15)
BLOOD UREA NITROGEN: 13 mg/dL (ref 7–21)
BUN / CREAT RATIO: 20
CALCIUM: 8.5 mg/dL (ref 8.5–10.2)
CHLORIDE: 102 mmol/L (ref 98–107)
CO2: 25 mmol/L (ref 22.0–30.0)
CREATININE: 0.66 mg/dL (ref 0.60–1.00)
EGFR CKD-EPI AA FEMALE: >90 mL/min/{1.73_m2} (ref >=60)
EGFR CKD-EPI NON-AA FEMALE: >90 mL/min/{1.73_m2} (ref >=60)
POTASSIUM: 4 mmol/L (ref 3.5–5.0)
SODIUM: 133 mmol/L — ABNORMAL LOW (ref 135–145)

## 2018-12-14 LAB — CREATININE: Creatinine:MCnc:Pt:Ser/Plas:Qn:: 0.66

## 2018-12-14 LAB — MAGNESIUM: Magnesium:MCnc:Pt:Ser/Plas:Qn:: 1.4 — ABNORMAL LOW

## 2018-12-14 LAB — MEAN CORPUSCULAR HEMOGLOBIN CONC: Lab: 31.6

## 2018-12-14 NOTE — Unmapped (Signed)
Diabetes education consultation: Consulted for assistance with providing instruction on diabetes self-management skills. Visit with Amanda Hanson at the bedside. Due to pt being very sleepy and with pain med on board - spent approximately 10 minutes starting an assessment for education needs.    Assessment:  Amanda Hanson  was admitted with right labia majora and left ischiorectal abscesses with surrounding cellulitis.  She has a history of insulin requiring diabetes.      A1C:    Lab Results   Component Value Date    A1C 13.4 (H) 08/23/2018     Amanda Hanson was able to engage for only a short time.  She was very sleepy.  She has been inconsiderable pain and had pain med on board.    She said that she has had diabetes for a long time and has had classes in the past.  She said one thing she learned that was very helpful is label reading.    Insulin administration & safety:   Pt reports that she has been on insulin for a long time.  Per med rec, pt is on basal-bolus insulin regimen.  For inpt management, she has been on a correctional sliding scale only, and is to be restarted on basal insulin this evening at a lower dose than at home.  When asked whether she is using insulin pen or the vials/bottles, she gave conflicting answers initially.  She Confirmed that she is currently using vial/syringe and prefers it.  Have asked that nursing staff allow her to measure and inject at least one of her doses - when she is alert - so they can assess for learning needs regarding insulin administration skills.  Will assess for insulin safety information needs at a later time.    Problem-solving: Hypoglycemia:  Will assess and review hypoglycemia recognition & treatment at a later time.  Provided printed material: Hypoglycemia in Diabetes as a resource.    Monitoring:   Pt was not able to confirm that she has a glucose meter and supplies.  Will assess at a later time.    Plan:  As above, not able to complete an assessment due to sleepiness and pain meds on board.  Will revisit at a later time to complete and initiate diabetes education as needed.  Thank You,   Jacqulynn Cadet, MS, RN, CDCES, Diabetes CNES, pager [phone]

## 2018-12-14 NOTE — Unmapped (Signed)
Order was placed for a PIV by Venous Access Team (VAT).  Patient was assessed for placement of a PIV. Access was obtained. Blood return noted.  Dressing intact and device well secured.  Flushed with normal saline.  Pt advised to inform RN of any s/s of discomfort at the PIV site.    Workup / Procedure Time:  30 minutes       Primary RN was notified.       Thank you,     Seymour Bars RN Venous Access Team

## 2018-12-14 NOTE — Unmapped (Signed)
Care Management  Initial Transition Planning Assessment    Admitted for labial and perirectal abscess.               General  Care Manager assessed the patient by : In person interview with patient, Discussion with Clinical Care team, Medical record review  Orientation Level: Oriented X4(Sleepy during interview)  Who provides care at home?: Family member(daughter does wound care)    Contact/Decision Maker  Extended Emergency Contact Information  Primary Emergency Contact: Robbins,Betty   [place]  Home Phone: [phone]  Relation: Mother  Secondary Emergency Contact: Morgan,[name]  Mobile Phone: [phone]  Relation: Relative    Legal Next of Kin / Guardian / POA / Advance Directives       Advance Directive (Medical Treatment)  Does patient have an advance directive covering medical treatment?: Patient does not have advance directive covering medical treatment.              Patient Information  Lives with: Children    Type of Residence: Private residence             Support Systems: Children, Parent    Responsibilities/Dependents at home?: No    Home Care services in place prior to admission?: No                  Equipment Currently Used at Home: cane, straight, walker, rolling, wound care supplies       Currently receiving outpatient dialysis?: No       Financial Information   On disability (SS)     Need for financial assistance?: No     Type of Residence: Mailing Address:  [address]  [place] [address]  Contacts: Accompanied by: Alone  Patient Phone Number: [phone]        Medical Provider(s): Noralyn Pick, FNP confirmed  Reason for Admission: Admitting Diagnosis:  No admission diagnoses are documented for this encounter.  Past Medical History:   has a past medical history of Abnormal mammogram, Anxiety disorder, Arthritis, Congestive heart failure (CHF) (CMS-HCC), DDD (degenerative disc disease), Diabetes mellitus type 2 in obese (CMS-HCC) (2011), Hypothyroidism, Mixed incontinence urge and stress (female)(female), Obesity, Other functional disorder of bladder, Rheumatoid arthritis(714.0), Sleep apnea, Urinary frequency, and Urinary obstruction, not elsewhere classified.  Past Surgical History:   has a past surgical history that includes Interstim Placement; Cystoscopy with Hydrodistention; Cholecystectomy; Hernia repair; Laparoscopic gastric banding; Oophorectomy (Bilateral, 2000); Hysterectomy (2000); Breast biopsy (Left, 5 15 2017); pr debridement, skin, sub-q tissue,=<20 sq cm (Right, 11/15/2017); pr amputation toe,mt-p jt (Right, 02/23/2018); pr debridement, skin, sub-q tissue,muscle,bone,=<20 sq cm (Right, 02/28/2018); pr secd clos surg wnd exten/complic (Right, 10/17/1599); pr debridement, skin, sub-q tissue,muscle,bone,=<20 sq cm (Right, 03/28/2018); pr full excis 1st metatarsal head (Right, 03/28/2018); pr incis/drain thigh/knee abscess,deep (Left, 06/14/2018); pr amputation toe,mt-p jt (Right, 08/02/2018); pr amputation metatarsal+toe,single (Right, 08/24/2018); pr i&d perianal abscess,superficial (Left, 12/13/2018); and pr i&d of vulva/perineum abscess (Left, 12/13/2018).   Previous admit date: 08/21/2018    Primary Insurance- Payor: MEDICAID [place] / Plan: MEDICAID [place] ACCESS / Product Type: *No Product type* /   Secondary Insurance ??? None  Prescription Coverage ??? Medicaid  Preferred Pharmacy - [facility] - [place], [place] - [address]  [facility]  [facility] [phone] [place], [place] - [address]  [facility]    Transportation home: Private vehicle her mother  Level of function prior to admission:  Requires Assistance family        Social Determinants of Health  Social Determinants of Health were addressed in provider documentation.  Please refer to patient history.    Discharge Needs Assessment  Concerns to be Addressed: discharge planning wound care at home, family teaching     Clinical Risk Factors: New Diagnosis, Multiple Diagnoses (Chronic), Functional Limitations    Barriers to taking medications: No    Prior overnight hospital stay or ED visit in last 90 days: No    Readmission Within the Last 30 Days: no previous admission in last 30 days         Anticipated Changes Related to Illness: none    Equipment Needed After Discharge: wound care supplies    Discharge Facility/Level of Care Needs: (Anticpated DC to home with daughter doing wound care.)    Readmission  Risk of Unplanned Readmission Score: UNPLANNED READMISSION SCORE: 53%  Predictive Model Details           53% (High) Factors Contributing to Score   Calculated 12/14/2018 16:14 23% Number of hospitalizations in last year is 9   [facility] Risk of Unplanned Readmission Model 20% Number of active Rx orders is 53     15% Number of ED visits in last six months is 5     5% Active antipsychotic Rx order is present     5% ECG/EKG order is present in last 6 months     5% Charlson Comorbidity Index is 8     4% Encounter of ten days or longer in last year is present     4% Diagnosis of electrolyte disorder is present     Readmitted Within the Last 30 Days? (No if blank)   Patient at risk for readmission?: Yes(Chronic nature of health.)    Discharge Plan  Screen findings are: Care Manager reviewed the plan of the patient's care with the Multidisciplinary Team. No discharge planning needs identified at this time. Care Manager will continue to manage plan and monitor patient's progress with the team.    Expected Discharge Date: 12/18/2018      Patient and/or family were provided with choice of facilities / services that are available and appropriate to meet post hospital care needs?: No       Initial Assessment complete?: Yes

## 2018-12-14 NOTE — Unmapped (Addendum)
Pt was alert but drowsy intermittently. and VSS, 2 liters [place]. Pt pain control with oxy. Pt dressing was changed today. Was unable to tolerate sitz bath. Pt foley in place, adequate uop. Paged team about status, came by assessed.  No other acute changes this shift.

## 2018-12-14 NOTE — Unmapped (Addendum)
No acute events this shift. VSS. Afebrile. Pain managed with PRN oxy. Very drowsy, but Oriented x4 C/o of nausea. Administer IV Zofran x1. Foley remains in place. Free from falls and injury this shift. Bed in low and locked position. Will continue to monitor  Problem: Adult Inpatient Plan of Care  Goal: Patient-Specific Goal (Individualization)  Outcome: Progressing     Problem: Adult Inpatient Plan of Care  Goal: Absence of Hospital-Acquired Illness or Injury  Outcome: Progressing     Problem: Adult Inpatient Plan of Care  Goal: Optimal Comfort and Wellbeing  Outcome: Progressing     Problem: Adult Inpatient Plan of Care  Goal: Readiness for Transition of Care  Outcome: Progressing     Problem: Adult Inpatient Plan of Care  Goal: Rounds/Family Conference  Outcome: Progressing

## 2018-12-14 NOTE — Unmapped (Signed)
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     Neuro: Acute on Chronic pain   - Pain well controlled on scheduled PO tylenol and PRN oxycodone   - Continue home pregabalin 150mg  TID  - Of note, patient followed by chronic pain (prescribed oxycodone 5 mg tablets, endorses taking 4 tablets daily). Confirmed in PDMP.     Anxiety, bi polar disorder, depression   - Followed outpatient by psychiatry, confirmed active medications, doses and regular refills with pharmacy   - Continue home Seroquel (300 mg nightly), Prozac (20 mg nightly), buspar (10 mg BID) and Abilify (5 mg daily)  - holding home Ambien while inpatient    CV: HTN  - HDS  - Hold home lisinopril for now, may resume at discharge     Resp:  - Stable on RA  - Continue pulmonary toliet: OOB and IS      Fen/GI:  - Medocked  - Lytes: replete as needed. IV mag today for hypomagnesemia   - Tolerating Reg diet  - Continue miralax, senna and colace   - Zofran as needed for nausea    GU: Hx of  mixed incontinence and recent UTI  - Hx E.coli UTI from Cx 11/01/18 - susceptible to ampicillin, cefazolin, cephalexin, ciprofloxacin, gentamicin, levofloxacin, nitrofurantoin, tobramycin, TMP/SMX  - UA 8/26 large leuk, >56 WBC  - UCx 8/27 pending  - Foley in place, plan TOV 8/28  - Foley with good urine output      Heme/ID: Abscess and cellulitis of labia and perianal area  - OR 8/26 for  I&D of abscesses on R labia majora and left medial buttocks.   - Penrose drain to buttocks, labial wound with wet to dry packing, change BID, sitz baths TID.   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       Endo: T2DM, Hypothyroidism  - Last hgb A1c 08/2018: 13.4, repeat pending   - Diabetes educator consulted to assess discharge needs  - Home Meds: Lantus 65 un nighty, Humalog 20 units TID, Metformin BID  - Resume lantus at 50 units nightly, will titrate as needed to home dose. - Continue SSI, holding metformin for now.   - Previously taking synthroid, has not filled since December 2019, patient will follow up with PCP.      Rheumatoid arthritis   - Continue home prednisone taper (per 8/3 office note: Take 20 mg daily x 2 weeks, 15 mg daily x 2 weeks, 10 mg daily x 2 weeks, then 5 mg until visit with rheumatology)  - Patient reports currently taking 10 mg, will continue 10 mg for 4 days (8/27-8/30) and then transition to 5 mg with rheum follow up.   - Follow up with rheumatology as previously arranged re: weekly methotrexate and weekly  tocilizumab       PPx:  - heparin 7500 q8h    Dispo: Floor  -  PT/OT pending, patient reports good support by daughter at home whom she lives with and is comfortable with wound care.       Contact: [facility]  [place]  [phone]      Subjective:   No acute events post op.     Objective:     Vital signs in last 24 hours:  Temp:  [35.9 ??C (96.6 ??F)-37.5 ??C (99.5 ??F)] 35.9 ??C (96.6 ??F)  Heart Rate:  [82-121] 98  SpO2 Pulse:  [89-121] 100  Resp:  [13-21] 17  BP: (77-142)/(42-81) 140/73  MAP (mmHg):  [83-101] 101  SpO2:  [91 %-99 %] 91 %    Intake/Output last 24 hours:  I/O last 3 completed shifts:  In: 2568 [I.V.:1403; IV Piggyback:1165]  Out: 1605 [Urine:1575; Blood:30]    Physical Exam:  General: Lying in bed  Neuro: A&O x3  Pulm: NWOB on RA  Abd: soft, non tender, non distended  Ext: WWP  GU: 2cm incision site on right labia majora with surrounding erythema and induration - packing replaced, 2x2cm incisions on left medial buttocks with penrose drain in place, with surrounding erythema and induration - dressing changed    Data Review:    All lab results last 24 hours:    Recent Results (from the past 24 hour(s))   POCT Glucose    Collection Time: 12/13/18 10:52 AM   Result Value Ref Range    Glucose, POC 191 (H) 70 - 179 mg/dL   Aerobic/Anaerobic Culture    Collection Time: 12/13/18 11:58 AM    Specimen: Anus; Swab, Intraoperative Collection   Result Value Ref Range    Aerobic/Anaerobic Culture Too Young To Read; No Predominant Pathogen.     Gram Stain Result 2+ Polymorphonuclear leukocytes     Gram Stain Result Mixed Gram Positive/Negative Organisms    POCT Glucose    Collection Time: 12/13/18 12:46 PM   Result Value Ref Range    Glucose, POC 194 (H) 70 - 179 mg/dL   POCT Glucose    Collection Time: 12/13/18  5:47 PM   Result Value Ref Range    Glucose, POC 191 (H) 70 - 179 mg/dL   POCT Glucose    Collection Time: 12/13/18  8:27 PM   Result Value Ref Range    Glucose, POC 201 (H) 70 - 179 mg/dL   PT-INR    Collection Time: 12/14/18  4:23 AM   Result Value Ref Range    PT 12.4 10.2 - 13.1 sec    INR 1.08    Basic Metabolic Panel    Collection Time: 12/14/18  4:23 AM   Result Value Ref Range    Sodium 133 (L) 135 - 145 mmol/L    Potassium 4.0 3.5 - 5.0 mmol/L    Chloride 102 98 - 107 mmol/L    CO2 25.0 22.0 - 30.0 mmol/L    Anion Gap 6 (L) 7 - 15 mmol/L    BUN 13 7 - 21 mg/dL    Creatinine 4.54 0.98 - 1.00 mg/dL    BUN/Creatinine Ratio 20     EGFR CKD-EPI Non-African American, Female >90 >=60 mL/min/1.80m2    EGFR CKD-EPI African American, Female >90 >=60 mL/min/1.2m2    Glucose 227 (H) 70 - 179 mg/dL    Calcium 8.5 8.5 - 11.9 mg/dL   CBC    Collection Time: 12/14/18  4:23  AM   Result Value Ref Range    WBC 7.9 4.5 - 11.0 10*9/L    RBC 4.55 4.00 - 5.20 10*12/L    HGB 11.6 (L) 12.0 - 16.0 g/dL    HCT 14.7 82.9 - 56.2 %    MCV 80.8 80.0 - 100.0 fL    MCH 25.5 (L) 26.0 - 34.0 pg    MCHC 31.6 31.0 - 37.0 g/dL    RDW 13.0 (H) 86.5 - 15.0 %    MPV 7.8 7.0 - 10.0 fL    Platelet 193 150 - 440 10*9/L   Magnesium Level    Collection Time: 12/14/18  4:23 AM   Result Value Ref Range    Magnesium 1.4 (L) 1.6 - 2.2 mg/dL   Phosphorus Level    Collection Time: 12/14/18  4:23 AM   Result Value Ref Range    Phosphorus 3.4 2.9 - 4.7 mg/dL   POCT Glucose    Collection Time: 12/14/18  8:45 AM   Result Value Ref Range    Glucose, POC 211 (H) 70 - 179 mg/dL         Imaging: Radiology studies were personally reviewed

## 2018-12-15 ENCOUNTER — Other Ambulatory Visit: Payer: Self-pay | Admitting: Pain Medicine

## 2018-12-15 DIAGNOSIS — R7982 Elevated C-reactive protein (CRP): Secondary | ICD-10-CM | POA: Insufficient documentation

## 2018-12-15 DIAGNOSIS — E559 Vitamin D deficiency, unspecified: Secondary | ICD-10-CM

## 2018-12-15 DIAGNOSIS — R700 Elevated erythrocyte sedimentation rate: Secondary | ICD-10-CM | POA: Insufficient documentation

## 2018-12-15 LAB — CBC
HEMATOCRIT: 33.7 % — ABNORMAL LOW (ref 36.0–46.0)
MEAN CORPUSCULAR HEMOGLOBIN CONC: 32.3 g/dL (ref 31.0–37.0)
MEAN CORPUSCULAR HEMOGLOBIN: 26.1 pg (ref 26.0–34.0)
MEAN CORPUSCULAR VOLUME: 80.8 fL (ref 80.0–100.0)
MEAN PLATELET VOLUME: 7.5 fL (ref 7.0–10.0)
PLATELET COUNT: 191 10*9/L (ref 150–440)
RED BLOOD CELL COUNT: 4.17 10*12/L (ref 4.00–5.20)
RED CELL DISTRIBUTION WIDTH: 17.8 % — ABNORMAL HIGH (ref 12.0–15.0)
WBC ADJUSTED: 5.2 10*9/L (ref 4.5–11.0)

## 2018-12-15 LAB — BASIC METABOLIC PANEL
ANION GAP: 5 mmol/L — ABNORMAL LOW (ref 7–15)
BLOOD UREA NITROGEN: 11 mg/dL (ref 7–21)
BUN / CREAT RATIO: 17
CALCIUM: 8.3 mg/dL — ABNORMAL LOW (ref 8.5–10.2)
CHLORIDE: 101 mmol/L (ref 98–107)
CO2: 27 mmol/L (ref 22.0–30.0)
CREATININE: 0.65 mg/dL (ref 0.60–1.00)
EGFR CKD-EPI AA FEMALE: >90 mL/min/{1.73_m2} (ref >=60)
EGFR CKD-EPI NON-AA FEMALE: >90 mL/min/{1.73_m2} (ref >=60)
GLUCOSE RANDOM: 195 mg/dL — ABNORMAL HIGH (ref 70–179)
POTASSIUM: 3.6 mmol/L (ref 3.5–5.0)

## 2018-12-15 LAB — THYROID STIMULATING HORMONE: Thyrotropin:ACnc:Pt:Ser/Plas:Qn:: 2.21

## 2018-12-15 LAB — MEAN CORPUSCULAR HEMOGLOBIN CONC: Lab: 32.3

## 2018-12-15 LAB — MAGNESIUM: Magnesium:MCnc:Pt:Ser/Plas:Qn:: 1.9

## 2018-12-15 LAB — CHLORIDE: Chloride:SCnc:Pt:Ser/Plas:Qn:: 101

## 2018-12-15 LAB — PHOSPHORUS: Phosphate:MCnc:Pt:Ser/Plas:Qn:: 2.7 — ABNORMAL LOW

## 2018-12-15 MED ORDER — VITAMIN D3 125 MCG (5000 UT) PO CAPS: 1.0000 | ORAL_CAPSULE | Freq: Every day | ORAL | 5 refills | Status: DC

## 2018-12-15 NOTE — Unmapped (Signed)
Diabetes education consultation:  Follow-up visit with pt to assess for diabetes self management education needs and provide instruction.  Spent approx 40 minutes assessing and providing diabetes education.     Ms. Beever was more alert this AM and able to engage in discussion about diabetes self-mgmt.  She did develop significant pain later, and not able to complete assessment and instruction.  Plan to try at later visit.    Ms. Rickles was receptive.  She said she didn't think there was anything she needed, but is aware of her high A1c and the implications.  She said her goal is to become more healthy and for her A1c to be around 7%.    Insulin administration & safety:   She was unable to describe her insulin regimen.  When I read back what is listed in in her chart (med rec) , she said that sounds right).  She reports omitting insulin doses because she forgets - both Lantus and Humalog..  She said she is not fearful of taking it.  She said she knows she needs to set an alarm, and will set it on her phone.  Discussed her insulin regimen, and what times to set alarm for.  She said she will set an alarm for Lantus for 10PM.  She eats only breakfast and supper daily.  Discussed timing of humalog for meal coverage is just before the meal, and if the meal is delayed, the Humalog is delayed; if the meal is omitted, the Humalog for that meal is omitted.  She verbalized plan to set alarm for Humalog at 11AM and 7PM every day, which are the usual times for her meals.  She reports snacking at times.  Explained that if she is having a snack containing a large amount of carbs, her glucose may become elevated; encouraged to discuss with her provider for possible dosing a smaller amount for large snacks, if elevated glucose trends noted after snacking.  She uses vials/syringe, and prefers them over insulin pens.  She reports no difficulties with measuring or injecting, and has supplies at home.     Problem-solving: Hypoglycemia: Instructed on hypoglycemia recognition & treatment, including 15-15 rule, listing several examples of appropriate fast carb sources, emphasizing importance of having something readily available. She had some misinformation and had not treated appropriately in the past.  She said she plans to have either fruit juice or soft peppermints readily accessible in case of low blood glucose, verbalized appropriate amounts for treatment.  Provided printed material: Hypoglycemia in Diabetes as a resource.    Healthy Eating:  She said she didn't feel she needed information about healthy eating and is familiar with the diabetes plate.   She did answer in affirmative when asked if she would like me to provide printed information on it for a refresher.       Plan:  Unable to continue visit due to significant pain.  Will try later today or next week.  Thank You,   Jacqulynn Cadet, MS, RN, CDCES, Diabetes CNES, pager [phone]

## 2018-12-15 NOTE — Unmapped (Signed)
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  Pt presents w/ decreased functional mobility, able to transfers sup <> sit SBA, sit <> stand to RW CGA and amb 5' w/ RW CGA.  Pt will benefit from skilled inpt PT to address her goals and limitations.     Today's Interventions: PT Eval.  pt performed bed mobility, transfers, balance and gait training w/ RW.  pt education for safety, precautions and progressive mobility.    Personal Factors/Comorbidities Present: 2       Examination of Body System: 1-3 elements       Clinical Decision Making: Low     PLAN  Planned Frequency of Treatment:  1-2x per day for: 3-4x week      Planned Interventions: Airway Clearance, Balance activities, Diaphragmatic / Pursed-lip breathing, Education - Patient, Education - Family / caregiver, Home exercise program, Gait training, Functional mobility, Endurance activities, Postural re-education, Self-care / Home training, Stair training, Therapeutic exercise, Transfer training, Therapeutic activity    Post-Discharge Physical Therapy Recommendations:  3x weekly    PT DME Recommendations: None           Goals:   Patient and Family Goals: To have less pain.    Long Term Goal #1: Pt will amb Independent limited comm. amb 6 weeks from today.       SHORT GOAL #1: Pt will transfers sup <> sit independent via logrolling.              Time Frame : 2 weeks  SHORT GOAL #2: Pt will transfer sit <> stand to RW mod I.              Time Frame : 2 weeks  SHORT GOAL #3: Pt will amb w/ RW 150' SBA.              Time Frame : 2 weeks                                        Prognosis:  Good  Positive Indicators: PLOF, Family assist  Barriers to Discharge: Gait instability, Pain, Endurance deficits, Decreased safety awareness, Impaired Balance    SUBJECTIVE  Patient reports: Pt agreeable to PT, wants to get up OOB, c/o pain at incision site.  Current Functional Status: Pt sitting on the EOB when PT arrived and left supine in bed 1/4 turned to the R. Call bell hand, RN aware pt asking for more pain meds.     Prior Functional Status: Reports amb Independent  Equipment available at home: Levan Hurst, [place]     Past Medical History:   Diagnosis Date   ??? Abnormal mammogram    ??? Anxiety  disorder    ??? Arthritis    ??? Congestive heart failure (CHF) (CMS-HCC)    ??? DDD (degenerative disc disease)    ??? Diabetes mellitus type 2 in obese (CMS-HCC) 2011   ??? Hypothyroidism    ??? Mixed incontinence urge and stress (female)(female)    ??? Obesity    ??? Other functional disorder of bladder    ??? Rheumatoid arthritis(714.0)    ??? Sleep apnea    ??? Urinary frequency    ??? Urinary obstruction, not elsewhere classified     Social History     Tobacco Use   ??? Smoking status: Former Smoker     Packs/day: 2.00     Years: 27.00     Pack years: 54.00     Types: Cigarettes     Quit date: 2003     Years since quitting: 17.6   ??? Smokeless tobacco: Former Neurosurgeon     Quit date: 2003   ??? Tobacco comment: Started smoking at 68, quit 2003.    Substance Use Topics   ??? Alcohol use: Yes     Alcohol/week: 7.0 standard drinks     Types: 7 Cans of beer per week     Binge frequency: Less than monthly      Past Surgical History:   Procedure Laterality Date   ??? BREAST BIOPSY Left 5 15 2017    with clip   ??? CHOLECYSTECTOMY     ??? Cystoscopy with Hydrodistention      11/2004   ??? HERNIA REPAIR     ??? HYSTERECTOMY  2000   ??? Interstim Placement      02/2005   ??? LAPAROSCOPIC GASTRIC BANDING     ??? OOPHORECTOMY Bilateral 2000   ??? PR AMPUTATION METATARSAL+TOE,SINGLE Right 08/24/2018    Procedure: PRIORITY 2nd ray amputation;  Surgeon: Karen Chafe, DPM;  Location: MAIN OR [facility];  Service: Vascular   ??? PR AMPUTATION TOE,MT-P JT Right 02/23/2018    Procedure: AMPUTATION, TOE; METATARSOPHALANGEAL JOINT;  Surgeon: Karen Chafe, DPM;  Location: MAIN OR [facility];  Service: Vascular   ??? PR AMPUTATION TOE,MT-P JT Right 08/02/2018    Procedure: AMPUTATION, TOE; METATARSOPHALANGEAL JOINT;  Surgeon: Webb Silversmith, MD;  Location: MAIN OR [facility];  Service: Vascular   ??? PR DEBRIDEMENT, SKIN, SUB-Q TISSUE,=<20 SQ CM Right 11/15/2017    Procedure: Debridement of right groin/perineal wound;  Surgeon: Joanie Coddington, MD;  Location: MAIN OR [facility];  Service: Trauma   ??? PR DEBRIDEMENT, SKIN, SUB-Q TISSUE,MUSCLE,BONE,=<20 SQ CM Right 02/28/2018    Procedure: DEBRIDEMENT; SKIN, SUBCUTANEOUS TISSUE, MUSCLE, & BONE FOOT;  Surgeon: Karen Chafe, DPM;  Location: MAIN OR [facility];  Service: Vascular   ??? PR DEBRIDEMENT, SKIN, SUB-Q TISSUE,MUSCLE,BONE,=<20 SQ CM Right 03/28/2018    Procedure: DEBRIDEMENT; SKIN, SUBCUTANEOUS TISSUE, MUSCLE, & BONE FOOT;  Surgeon: Karen Chafe, DPM;  Location: MAIN OR [facility];  Service: Vascular   ??? PR FULL EXCIS 1ST METATARSAL HEAD Right 03/28/2018    Procedure: OSTECTOMY COMPLT EXC; FIRST METATARSAL HEAD;  Surgeon: Karen Chafe, DPM;  Location: MAIN OR [facility];  Service: Vascular   ??? PR I&D OF VULVA/PERINEUM ABSCESS Left 12/13/2018    Procedure: Incision And Drainage Of Vulva Or Perineal Abscess;  Surgeon: Joanie Coddington, MD;  Location: MAIN OR [facility];  Service: Trauma   ??? PR I&D PERIANAL ABSCESS,SUPERFICIAL Left 12/13/2018    Procedure: INCISION AND DRAINAGE, PERIANAL ABSCESS, SUPERFICIAL; Surgeon: Joanie Coddington, MD;  Location:  MAIN [facility];  Service: Trauma   ??? PR INCIS/DRAIN THIGH/KNEE ABSCESS,DEEP Left 06/14/2018    Procedure: Deep thigh abscess drainage;  Surgeon: Suella Broad, MD;  Location: MAIN OR [facility];  Service: Trauma   ??? PR SECD CLOS SURG WND EXTEN/COMPLIC Right 02/28/2018    Procedure: SECONDARY CLOSURE OF SURGICAL WOUND OR DEHISCENCE, EXTENSIVE OR COMPLICATED;  Surgeon: Karen Chafe, DPM;  Location: MAIN OR [facility];  Service: Vascular    Family History   Problem Relation Age of Onset   ??? Cancer Maternal Grandmother         Lung cancer   ??? Diabetes Maternal Grandmother    ??? Rheum arthritis Sister    ??? GU problems Neg Hx    ??? Kidney cancer Neg Hx    ??? Prostate cancer Neg Hx         Allergies: Cephalexin, Sulfa (sulfonamide antibiotics), Codeine, Darvocet a500  [propoxyphene n-acetaminophen], Hydrocodone, Meropenem, and Sulfasalazine                Objective Findings  Precautions / Restrictions  Precautions: Isolation precautions  Weight Bearing Status: Non-applicable  Required Braces or Orthoses: Non-applicable    Communication Preference: Verbal   Pain Comments: 10/10 at incsional area, RN to bring more pain meds as able.  Medical Tests / Procedures: Reviewed H&P, orders  Equipment / Environment: Vascular access (PIV, TLC, Port-a-cath, PICC), Foley, Patient not wearing mask for full session, Caregiver wearing mask for full session(Caregiver wearing eye protection)    At Rest: VSS per EPIC  With Activity: NAD  Orthostatics: No s/sx  Airway Clearance: Mobility    Living Situation  Living Environment: House  Lives With: Daughter  Home Living: One level home, Ramped entrance, Walk-in shower, Built-in shower seat, Hand-held shower hose, Handicapped height toilet, Bedside commode     Cognition: A&O x3  Visual / Perception Status: Intact  Skin Inspection: CDI visible areas    UE ROM: WFL  UE Strength: WFL  LE ROM: WFL  LE Strength: WFL                Coordination: Fair         Balance: Sitting supervision, standing SBA   Posture: Flexed     Bed Mobility: Supt <> sit SBA  Transfers: Sit <> stand to RW CGA   Gait  Level of Assistance: Contact guard assist, steadying assist  Assistive Device: Front wheel walker  Distance Ambulated (ft): 5 ft  Gait: Pt able to amb w/ RW 5', CGA, and working on standing balance/wt. shifting.  Stairs: NA, pt reprots having a ramp      Endurance: Fair    Physical Therapy Session Duration  PT Individual - Duration: 40    Medical Staff Made Aware: RN    I attest that I have reviewed the above information.  Signed: Shannan Harper, PT  Filed 12/15/2018

## 2018-12-15 NOTE — Unmapped (Signed)
VENOUS ACCESS ULTRASOUND PROCEDURE NOTE    Indications:   Poor venous access.    The Venous Access Team has assessed this patient for the placement of a PIV. Ultrasound guidance was necessary to obtain access.     Procedure Details:  Risks, benefits and alternatives discussed with patient. Identity of the patient was confirmed via name, medical record number and date of birth. The availability of the correct equipment was verified.    The vein was identified for ultrasound catheter insertion.  Field was prepared with necessary supplies and equipment.  Probe cover and sterile gel utilized.  Insertion site was prepped with chlorhexidine solution and allowed to dry.  The catheter extension was primed with normal saline.A(n) 22 g x 1.75 inch catheter was placed in the right forearm with 1 attempt(s).     Catheter aspirated, 4 mL blood return present. The catheter was then flushed with 10 mL of normal saline. Insertion site cleansed, and dressing applied per manufacturer guidelines. The catheter was inserted with difficulty due to poor vasculature  by Michel Santee RN.      RN was notified.     Thank you,     Michel Santee RN Venous Access Team   [phone]     Workup / Procedure Time:  30 minutes    See vein image below:

## 2018-12-15 NOTE — Unmapped (Signed)
""  Order was placed for a \""PIV by Venous Access Team (VAT)\"".  Patient was assessed for placement of a PIV. Access was obtained. Blood return noted.  Dressing intact and device well secured.  Flushed with normal saline.  Pt advised to inform RN of any s/s of discomfort at the PIV site.    Workup / Procedure Time:  30 minutes       RN was notified.       Thank you,     Laurann Montana RN Venous Access Team""

## 2018-12-15 NOTE — Unmapped (Signed)
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     Neuro: Acute on Chronic pain   - Add scheduled APAP, toradol  - Continue PRN oxy   - Continue home pregabalin 150mg  TID  - Of note, patient followed by chronic pain (prescribed oxycodone 5 mg tablets, endorses taking 4 tablets daily). Confirmed in PDMP.     Anxiety, bi polar disorder, depression   - Followed outpatient by psychiatry, confirmed active medications, doses and regular refills with pharmacy   - Continue home Seroquel (300 mg nightly - currently on 200mg  nightly here), Prozac (20 mg nightly), buspar (10 mg BID) and Abilify (5 mg daily)  - holding home Ambien while inpatient    CV: HTN  - HDS  - Hold home lisinopril for now, may resume at discharge     Resp:  - Stable on RA  - Continue pulmonary toliet: OOB and IS    Fen/GI:  - Medocked  - Lytes: replete as needed. IV KPhos today for hypophosphatemia   - Tolerating Reg diet  - Continue miralax, senna and colace   - Zofran as needed for nausea    GU: Hx of  mixed incontinence and recent UTI  - Hx E.coli UTI from Cx 11/01/18 - susceptible to ampicillin, cefazolin, cephalexin, ciprofloxacin, gentamicin, levofloxacin, nitrofurantoin, tobramycin, TMP/SMX  - UA 8/26 large leuk, >56 WBC  - UCx 8/27 pending  - Foley in place, plan TOV 8/28  - Foley with good urine output      Heme/ID: Abscess and cellulitis of labia and perianal area  - OR 8/26 for  I&D of abscesses on R labia majora and left medial buttocks.   - Penrose drain to buttocks, labial wound with wet to dry packing, change BID, sitz baths TID.   - Afebrile  - WBC 5.2  - H/H stable  - 8/25 Blood cx x2 NGTD  - 8/27 Urine Cx pending  - 8/26 wound cx, TYTR (mixed gram +/- organisms)  - Continue vanc/zosyn, pending culture results, will likely transition to PO doxycyline  *of note patient with recent hx of R great toe osteomyelitis w/ MRSA (Sensitive to doxy, gent, linezolid, TMP-SMX, vanc; Resistant to clinda, erythromycin, nafcillin) s/p amputations and completion of 6 week IV Vanc/ertapenum course.       Endo: T2DM, Hypothyroidism  - Last hgb A1c 08/2018: 13.4, repeat 8/28 also 13.4  - Diabetes educator consulted to assess discharge needs  - Home Meds: Lantus 65 un nighty, Humalog 20 units TID, Metformin BID  - Resume lantus at 50 units nightly, will titrate as needed to home dose.   - Continue SSI, holding metformin for now.   - Previously taking synthroid, has not filled since December 2019, patient will follow up with PCP.   - Medicine consulted to assist with medication management r/t T2DM, HgA1C 13.5     Rheumatoid arthritis   - Continue home prednisone taper (per 8/3 office note: Take 20 mg daily x 2 weeks, 15 mg daily x 2 weeks, 10 mg daily x 2 weeks, then 5 mg until visit with rheumatology)  - Patient reports currently  taking 10 mg, will continue 10 mg for 4 days (8/27-8/30) and then transition to 5 mg with rheum follow up.   - Follow up with rheumatology as previously arranged re: weekly methotrexate and weekly tocilizumab     PPx:  - heparin 7500 q8h    Dispo: Floor  -  PT/OT pending, patient reports good support by daughter at home whom she lives with and is comfortable with wound care.     Subjective:   Could not tolerate sitz bath yesterday due to pain. Very sleepy yesterday. Evening lyrica/ seroquel held. Tearful overnight. Received 1x IV dilaudid for pain o/n.    Objective:     Vital signs in last 24 hours:  Temp:  [35.5 ??C (95.9 ??F)-36 ??C (96.8 ??F)] 35.5 ??C (95.9 ??F)  Heart Rate:  [73-98] 73  Resp:  [17-18] 18  BP: (106-140)/(57-84) 128/57  MAP (mmHg):  [78-104] 82  SpO2:  [88 %-96 %] 95 %  BMI (Calculated):  [52.97] 52.97    Intake/Output last 24 hours:  I/O last 3 completed shifts:  In: 1200 [P.O.:300; IV Piggyback:900]  Out: 2150 [Urine:2150]    Physical Exam:  General: Lying in bed, tearful, shifting uncomfortably   Neuro: A&O x3  Pulm: NWOB on RA  Abd: soft, non tender, non distended  Ext: WWP  GU: 2cm incision site on right labia majora with surrounding erythema and induration, 2x2cm incisions on left medial buttocks with penrose drain in place, with surrounding mild erythema and induration, TTP            Data Review:    All lab results last 24 hours:    Recent Results (from the past 24 hour(s))   POCT Glucose    Collection Time: 12/14/18  8:45 AM   Result Value Ref Range    Glucose, POC 211 (H) 70 - 179 mg/dL   POCT Glucose    Collection Time: 12/14/18 12:57 PM   Result Value Ref Range    Glucose, POC 260 (H) 70 - 179 mg/dL   POCT Glucose    Collection Time: 12/14/18  3:01 PM   Result Value Ref Range    Glucose, POC 321 (H) 70 - 179 mg/dL   POCT Glucose    Collection Time: 12/14/18  8:28 PM   Result Value Ref Range    Glucose, POC 207 (H) 70 - 179 mg/dL   CBC    Collection Time: 12/15/18  4:09 AM   Result Value Ref Range    WBC 5.2 4.5 - 11.0 10*9/L    RBC 4.17 4.00 - 5.20 10*12/L    HGB 10.9 (L) 12.0 - 16.0 g/dL    HCT 16.1 (L) 09.6 - 46.0 %    MCV 80.8 80.0 - 100.0 fL    MCH 26.1 26.0 - 34.0 pg    MCHC 32.3 31.0 - 37.0 g/dL    RDW 04.5 (H) 40.9 - 15.0 %    MPV 7.5 7.0 - 10.0 fL    Platelet 191 150 - 440 10*9/L   Basic Metabolic Panel    Collection Time: 12/15/18  4:09 AM   Result Value Ref Range    Sodium 133 (L) 135 - 145 mmol/L    Potassium 3.6 3.5 - 5.0 mmol/L    Chloride 101 98 - 107 mmol/L    CO2 27.0 22.0 - 30.0 mmol/L    Anion Gap 5 (L) 7 - 15 mmol/L    BUN 11 7 - 21 mg/dL  Creatinine 0.65 0.60 - 1.00 mg/dL    BUN/Creatinine Ratio 17     EGFR CKD-EPI Non-African American, Female >90 >=60 mL/min/1.37m2    EGFR CKD-EPI African American, Female >90 >=60 mL/min/1.67m2    Glucose 195 (H) 70 - 179 mg/dL    Calcium 8.3 (L) 8.5 - 10.2 mg/dL   Magnesium Level    Collection Time: 12/15/18  4:09 AM   Result Value Ref Range    Magnesium 1.9 1.6 - 2.2 mg/dL   Phosphorus Level    Collection Time: 12/15/18  4:09 AM   Result Value Ref Range    Phosphorus 2.7 (L) 2.9 - 4.7 mg/dL   POCT Glucose    Collection Time: 12/15/18  6:50 AM   Result Value Ref Range    Glucose, POC 178 70 - 179 mg/dL         Imaging: Radiology studies were personally reviewed
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
## 2018-12-15 NOTE — Unmapped (Addendum)
OCCUPATIONAL THERAPY  Evaluation (12/15/18 1329)    Patient Name:  Amanda Hanson       Medical Record Number: [id number]   Date of Birth: March 30, 1961  Sex: Female          OT Treatment Diagnosis:  difficulty with ADLs    Assessment  Problem List: Decreased endurance, Impaired balance, Fall Risk, Impaired ADLs, post op pain    Assessment: 58 year old woman with PMHx including chronic pain, HTN, COPD, hypothyroidism, T2DM c/b osteomyelitis of R toe s/p amputation and completed IV abx course, rheumatoid arthritis on prednisone, mixed incontinence, bipolar disorder, depression, and anxiety who presented with right labia majora and left ischiorectal abscesses with surrounding cellulitis, now s/p OR for I&D on 8/26.  Pt presents with above stated deficits impacting her independence & safety completing ADLs & functional mobility/transfers.  Pt will benefit from skilled OT to maximize her functional indepenence, safety and decrease her burden of care.  Review of pt's occupational profile, client history, assessment of occupational performance, clinical decision making and development of POC required moderate complexity OT evaluation.     Today's Interventions: role of OT, POC, evaluation, standing balance/posture, education on modified LB ADL techniques including positioning on bed, using stool, LH AE, various height chairs, importance & benefits of participate in ADLs while in house to maximize her endurance and independence    Activity Tolerance During Today's Session  Patient tolerated treatment well    Plan  Planned Frequency of Treatment:  1-2x per day for: 3-4x week    Planned Interventions:  Adaptive equipment, ADL retraining, Balance activities, Bed mobility, Compensatory tech. training, Conservation, Education - Patient, Education - Family / caregiver, Endurance activities, Functional mobility, Actor Occupational Therapy Recommendations:  OT Post Acute Discharge Recommendations: 3x weekly   OT DME Recommendations: None    GOALS:   Patient and Family Goals: to go home    Long Term Goal #1: Pt will return to PLOF in 4 weeks.    Short Term:  Pt will complete toilet transfer mod I using LRAD.   Time Frame : 1 week  Pt will complete toileting mod I using LRAD.   Time Frame : 1 week  Pt will complete functional activity in standing x5 minutes mod I using LRAD.   Time Frame : 1 week    Prognosis:  Good  Positive Indicators:  participation  Barriers to Discharge: Gait instability, Pain, Endurance deficits, Decreased safety awareness, Impaired Balance, Inability to safely perform ADLS    Subjective  Current Status Pt received and left in bed, all needs within reach, RN Cathlean Cower notified  Prior Functional Status Pt reports her daughter assists with LB dressing and bathing; uses RW or cane depending on severity of RA flare.    Medical Tests / Procedures: reviewed  Patient / Caregiver reports: Am I crazy?    Past Medical History:   Diagnosis Date   ??? Abnormal mammogram    ??? Anxiety disorder    ??? Arthritis    ??? Congestive heart failure (CHF) (CMS-HCC)    ??? DDD (degenerative disc disease)    ??? Diabetes mellitus type 2 in obese (CMS-HCC) 2011   ??? Hypothyroidism    ??? Mixed incontinence urge and stress (female)(female)    ??? Obesity    ??? Other functional disorder of bladder    ??? Rheumatoid arthritis(714.0)    ??? Sleep apnea    ??? Urinary frequency    ??? Urinary obstruction,  not elsewhere classified     Social History     Tobacco Use   ??? Smoking status: Former Smoker     Packs/day: 2.00     Years: 27.00     Pack years: 54.00     Types: Cigarettes     Quit date: 2003     Years since quitting: 17.6   ??? Smokeless tobacco: Former Neurosurgeon     Quit date: 2003   ??? Tobacco comment: Started smoking at 12, quit 2003.    Substance Use Topics   ??? Alcohol use: Yes     Alcohol/week: 7.0 standard drinks     Types: 7 Cans of beer per week     Binge frequency: Less than monthly      Past Surgical History:   Procedure Laterality Date   ??? BREAST BIOPSY Left 5 15 2017    with clip   ??? CHOLECYSTECTOMY     ??? Cystoscopy with Hydrodistention      11/2004   ??? HERNIA REPAIR     ??? HYSTERECTOMY  2000   ??? Interstim Placement      02/2005   ??? LAPAROSCOPIC GASTRIC BANDING     ??? OOPHORECTOMY Bilateral 2000   ??? PR AMPUTATION METATARSAL+TOE,SINGLE Right 08/24/2018    Procedure: PRIORITY 2nd ray amputation;  Surgeon: Karen Chafe, DPM;  Location: MAIN OR [facility];  Service: Vascular   ??? PR AMPUTATION TOE,MT-P JT Right 02/23/2018    Procedure: AMPUTATION, TOE; METATARSOPHALANGEAL JOINT;  Surgeon: Karen Chafe, DPM;  Location: MAIN OR [facility];  Service: Vascular   ??? PR AMPUTATION TOE,MT-P JT Right 08/02/2018    Procedure: AMPUTATION, TOE; METATARSOPHALANGEAL JOINT;  Surgeon: Webb Silversmith, MD;  Location: MAIN OR [facility];  Service: Vascular   ??? PR DEBRIDEMENT, SKIN, SUB-Q TISSUE,=<20 SQ CM Right 11/15/2017    Procedure: Debridement of right groin/perineal wound;  Surgeon: Joanie Coddington, MD;  Location: MAIN OR [facility];  Service: Trauma   ??? PR DEBRIDEMENT, SKIN, SUB-Q TISSUE,MUSCLE,BONE,=<20 SQ CM Right 02/28/2018    Procedure: DEBRIDEMENT; SKIN, SUBCUTANEOUS TISSUE, MUSCLE, & BONE FOOT;  Surgeon: Karen Chafe, DPM;  Location: MAIN OR [facility];  Service: Vascular   ??? PR DEBRIDEMENT, SKIN, SUB-Q TISSUE,MUSCLE,BONE,=<20 SQ CM Right 03/28/2018    Procedure: DEBRIDEMENT; SKIN, SUBCUTANEOUS TISSUE, MUSCLE, & BONE FOOT;  Surgeon: Karen Chafe, DPM;  Location: MAIN OR [facility];  Service: Vascular   ??? PR FULL EXCIS 1ST METATARSAL HEAD Right 03/28/2018    Procedure: OSTECTOMY COMPLT EXC; FIRST METATARSAL HEAD;  Surgeon: Karen Chafe, DPM;  Location: MAIN OR [facility];  Service: Vascular   ??? PR I&D OF VULVA/PERINEUM ABSCESS Left 12/13/2018    Procedure: Incision And Drainage Of Vulva Or Perineal Abscess;  Surgeon: Joanie Coddington, MD;  Location: MAIN OR [facility];  Service: Trauma   ??? PR I&D PERIANAL ABSCESS,SUPERFICIAL Left 12/13/2018    Procedure: INCISION AND DRAINAGE, PERIANAL ABSCESS, SUPERFICIAL;  Surgeon: Joanie Coddington, MD;  Location: MAIN OR [facility];  Service: Trauma   ??? PR INCIS/DRAIN THIGH/KNEE ABSCESS,DEEP Left 06/14/2018    Procedure: Deep thigh abscess drainage;  Surgeon: Suella Broad, MD;  Location: MAIN OR [facility];  Service: Trauma   ??? PR SECD CLOS SURG WND EXTEN/COMPLIC Right 02/28/2018    Procedure: SECONDARY CLOSURE OF SURGICAL WOUND OR DEHISCENCE, EXTENSIVE OR COMPLICATED;  Surgeon: Karen Chafe, DPM;  Location: MAIN OR [facility];  Service: Vascular    Family History   Problem Relation Age  of Onset   ??? Cancer Maternal Grandmother         Lung cancer   ??? Diabetes Maternal Grandmother    ??? Rheum arthritis Sister    ??? GU problems Neg Hx    ??? Kidney cancer Neg Hx    ??? Prostate cancer Neg Hx         Cephalexin, Sulfa (sulfonamide antibiotics), Codeine, Darvocet a500  [propoxyphene n-acetaminophen], Hydrocodone, Meropenem, and Sulfasalazine     Objective Findings  Precautions / Restrictions  Isolation precautions, Falls precautions    Weight Bearing  Non-applicable    Required Braces or Orthoses  Non-applicable    Communication Preference  Verbal    Pain  c/o post op pain, RN notified, medicated prior to session    Equipment / Environment  Vascular access (PIV, TLC, Port-a-cath, PICC), Foley, Patient not wearing mask for full session(OT wearing mask & goggles)    Living Situation  Living Environment: House  Lives With: Daughter  Home Living: One level home, Ramped entrance, Walk-in shower, Hand-held shower hose, Handicapped height toilet, Bedside commode, Shower chair with back     Cognition   Orientation Level:  Oriented x 4   Arousal/Alertness:  Appropriate responses to stimuli   Attention Span:  Appears intact   Memory:  Appears intact   Following Commands:  Follows all commands and directions without difficulty   Safety Judgment:  Good awareness of safety precautions Awareness of Errors:  Good awareness of safety precautions   Problem Solving:  Able to problem solve independently    Vision / Perception    Hearing: wears hearing aids   Vision: Wears glasses all the time  Perception: [facility]    Hand Function  Hand Dominance: right  WFL    Skin Inspection  visible skin c/d/i    ROM / Strength/Coordination  UE ROM/ Strength/ Coordination: WFL  LE ROM/ Strength/ Coordination: see PT note    Sensation:  BUE SILT    Balance:  static/dynamic sitting=independent; static/dynamic standing=min assist    Functional Mobility  Transfer Assistance Needed: Yes  Transfers - Needs Assistance: Min assist, Physical assistance required(sit<>stand=SBA, side stepping and simulated BSC transfer=min assist; standing tolerance=~2 minutes  Bed Mobility Assistance Needed: No(Pt laying perpendicular on bed, on right side, propped up on pillows & bed rail.  Pt able to sit up to EOB mod I.)      ADLs  ADLs: Needs assistance with ADLs  ADLs - Needs Assistance: Feeding, Grooming, Bathing, Toileting, UB dressing, LB dressing  Feeding - Needs Assistance: (mod I (dentures))  Grooming - Needs Assistance: Requires additional structure  Bathing - Needs Assistance: Mod assist, Physical assistance required (baseline LOF)  Toileting - Needs Assistance: Mod assist, Physical assistance required (unable to simulate hygiene thoroughly)   UB Dressing - Needs Assistance: Requires additional structure  LB Dressing - Needs Assistance: (total assist, baseline LOF)      Vitals / Orthostatics  At Rest: NAD  With Activity: NAD      Medical Staff Made Aware: RN Cathlean Cower      Occupational Therapy Session Duration  OT Individual - Duration: 17         I attest that I have reviewed the above information.  Signed: Virgina Evener, OT  Filed 12/15/2018

## 2018-12-15 NOTE — Unmapped (Signed)
No acute events this shift. VSS. Afebrile. C/O pain managed not completely managed with pain management. Very drowsy, but more alert than previous night. Tearful. Free from falls or injury this shift, Bell in low and locked position. Will continue to monitor.   Problem: Adult Inpatient Plan of Care  Goal: Plan of Care Review  Outcome: Progressing     Problem: Adult Inpatient Plan of Care  Goal: Absence of Hospital-Acquired Illness or Injury  Outcome: Progressing     Problem: Adult Inpatient Plan of Care  Goal: Optimal Comfort and Wellbeing  Outcome: Progressing     Problem: Adult Inpatient Plan of Care  Goal: Readiness for Transition of Care  Outcome: Progressing     Problem: Adult Inpatient Plan of Care  Goal: Rounds/Family Conference  Outcome: Progressing

## 2018-12-16 LAB — CBC
HEMATOCRIT: 34 % — ABNORMAL LOW (ref 36.0–46.0)
HEMOGLOBIN: 10.9 g/dL — ABNORMAL LOW (ref 12.0–16.0)
MEAN CORPUSCULAR HEMOGLOBIN CONC: 32.2 g/dL (ref 31.0–37.0)
MEAN CORPUSCULAR HEMOGLOBIN: 25.9 pg — ABNORMAL LOW (ref 26.0–34.0)
MEAN CORPUSCULAR VOLUME: 80.7 fL (ref 80.0–100.0)
MEAN PLATELET VOLUME: 8.8 fL (ref 7.0–10.0)
PLATELET COUNT: 153 10*9/L (ref 150–440)
RED BLOOD CELL COUNT: 4.21 10*12/L (ref 4.00–5.20)
RED CELL DISTRIBUTION WIDTH: 18.1 % — ABNORMAL HIGH (ref 12.0–15.0)
WBC ADJUSTED: 3.7 10*9/L — ABNORMAL LOW (ref 4.5–11.0)

## 2018-12-16 LAB — BASIC METABOLIC PANEL
ANION GAP: 12 mmol/L (ref 7–15)
BLOOD UREA NITROGEN: 11 mg/dL (ref 7–21)
BUN / CREAT RATIO: 14
CALCIUM: 8.6 mg/dL (ref 8.5–10.2)
CO2: 18 mmol/L — ABNORMAL LOW (ref 22.0–30.0)
CREATININE: 0.78 mg/dL (ref 0.60–1.00)
EGFR CKD-EPI AA FEMALE: >90 mL/min/{1.73_m2} (ref >=60)
EGFR CKD-EPI NON-AA FEMALE: 85 mL/min/{1.73_m2} (ref >=60)
GLUCOSE RANDOM: 117 mg/dL (ref 70–179)
POTASSIUM: 4.2 mmol/L (ref 3.5–5.0)
SODIUM: 135 mmol/L (ref 135–145)

## 2018-12-16 LAB — CALCIUM: Calcium:MCnc:Pt:Ser/Plas:Qn:: 8.6

## 2018-12-16 LAB — MAGNESIUM: Magnesium:MCnc:Pt:Ser/Plas:Qn:: 1.8

## 2018-12-16 LAB — MEAN PLATELET VOLUME: Lab: 8.8

## 2018-12-16 LAB — PHOSPHORUS: Phosphate:MCnc:Pt:Ser/Plas:Qn:: 4

## 2018-12-16 NOTE — Unmapped (Signed)
No acute events during shift.  Per patient, prn oxy not controlling pain very well.  Internal medicine consulted patient during shift.  VSS on RA.  Patient resting in bed. All safety measures in place.  Will continue to monitor.     Problem: Wound  Goal: Optimal Wound Healing  Outcome: Progressing     Problem: Skin Injury Risk Increased  Goal: Skin Health and Integrity  Outcome: Progressing     Problem: Adult Inpatient Plan of Care  Goal: Plan of Care Review  Outcome: Progressing  Goal: Patient-Specific Goal (Individualization)  Outcome: Progressing  Goal: Absence of Hospital-Acquired Illness or Injury  Outcome: Progressing  Goal: Optimal Comfort and Wellbeing  Outcome: Progressing  Goal: Readiness for Transition of Care  Outcome: Progressing  Goal: Rounds/Family Conference  Outcome: Progressing     Problem: Infection  Goal: Infection Symptom Resolution  Outcome: Progressing     Problem: COPD Comorbidity  Goal: Maintenance of COPD Symptom Control  Outcome: Progressing     Problem: Heart Failure Comorbidity  Goal: Maintenance of Heart Failure Symptom Control  Outcome: Progressing     Problem: Obstructive Sleep Apnea Risk or Actual (Comorbidity Management)  Goal: Unobstructed Breathing During Sleep  Outcome: Progressing

## 2018-12-16 NOTE — Unmapped (Signed)
No acute events during shift.  VSS on RA.  Foley removed at 1300. Educated patient on peri bottle use on wound. Contact precautions maintained.  Family at bedside.  Patient resting comfortably.  All safety measures in place.  Will continue to monitor.      Problem: Wound  Goal: Optimal Wound Healing  Outcome: Progressing     Problem: Skin Injury Risk Increased  Goal: Skin Health and Integrity  Outcome: Progressing     Problem: Adult Inpatient Plan of Care  Goal: Plan of Care Review  Outcome: Progressing  Goal: Patient-Specific Goal (Individualization)  Outcome: Progressing  Goal: Absence of Hospital-Acquired Illness or Injury  Outcome: Progressing  Goal: Optimal Comfort and Wellbeing  Outcome: Progressing  Goal: Readiness for Transition of Care  Outcome: Progressing  Goal: Rounds/Family Conference  Outcome: Progressing     Problem: Infection  Goal: Infection Symptom Resolution  Outcome: Progressing     Problem: COPD Comorbidity  Goal: Maintenance of COPD Symptom Control  Outcome: Progressing     Problem: Heart Failure Comorbidity  Goal: Maintenance of Heart Failure Symptom Control  Outcome: Progressing     Problem: Obstructive Sleep Apnea Risk or Actual (Comorbidity Management)  Goal: Unobstructed Breathing During Sleep  Outcome: Progressing

## 2018-12-16 NOTE — Unmapped (Signed)
Thank you for your Inpatient E-Consult.    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 Diff colitis in the setting of abx use 02/2018 who was admitted on 8/25 for perineal pain and found to have a perineal cellulitis, perianal abscess, and a smaller labial abscess. Taken to the OR 8/26 for I&D with purulence encountered in both areas, large perianal cavity, small labial cavity. Treated initially with vancomycin and pip/tazo (only received about ~12-18h of abx prior to OR). Culture from the perianal abscess is growing enteric flora, MDR screen negative. We were consulted regarding recommended PO regimen as she is allergic to cephalexin, doxycycline, and sulfa antibiotics as well as a history of a rash to meropenem.      My recommendations are as follows:  - Given growth of enteric flora on culture, an appropriate PO regimen for coverage of perianal abscess would be ciprofloxacin 500mg  BID + metronidazole 500mg  TID.  - MRSA is a commonly implicated pathogen in vulvar abscesses (in addition to enteric flora and GU anaerobes). Although we do not have cultures from the labial abscess itself, would presume causative pathogens would be similar to that of the perianal abscess given their proximity. In addition, the labial abscess was much smaller, has been drained (which is likely the most important component of management), and she has received 4 days of vancomycin after I&D. Therefore, I do not think she needs additional MRSA coverage at this time.  - Defer to primary team regarding timing of transition from IV to PO antibiotics based on patient's clinical improvement. If ID input into this decision would be helpful, please place an in person consult as physical exam would be an important component of the evaluation.     I spent 21-25 minutes in medical consultative discussion and review of medical records, including a written report to the treating provider via electronic health record regarding the condition of this patient.    This Inpatient E-Consult did include an answerable clinical question and did not recommend an in person consult.    My recommendation was to continue care locally.    I communicated this information to the requesting provider via a written report.      Melanee Left, MD    This Inpatient E-Consult is based solely on the clinical information available to me in the patient's medical record and is provided without benefit of a comprehensive evaluation or physical examination of the patient. The information contained in this Inpatient E-Consult must be interpreted in light of any clinical issues or changes in patient status that were not known to me at the time the Inpatient E-Consult was completed. You must rely on your own informed clinical judgement for decision making. If necessary, you may request an in person consult with the patient during hospitalization. Please contact me if you have further questions.

## 2018-12-16 NOTE — Unmapped (Signed)
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     Neuro: Acute on Chronic pain   - Add scheduled APAP, toradol - pain much more controlled today  - Continue PRN oxy   - Continue home pregabalin 150mg  TID  - Of note, patient followed by chronic pain (prescribed oxycodone 5 mg tablets, endorses taking 4 tablets daily). Confirmed in PDMP.     Anxiety, bi polar disorder, depression   - Followed outpatient by psychiatry, confirmed active medications, doses and regular refills with pharmacy   - Continue home Seroquel (300 mg nightly ), Prozac (20 mg nightly), buspar (10 mg BID) and Abilify (5 mg daily)  - holding home Ambien while inpatient  - Medicine team consulted, agree with continuing all home meds and holding ambien    CV: HTN  - HDS  - Hold home lisinopril for now, may resume at discharge     Resp:  - Stable on RA  - Continue pulmonary toliet: OOB and IS    Fen/GI:  - Medocked  - Lytes: replete as needed. Mg replaced today for goal of 2  - Tolerating Reg diet  - Continue miralax, senna and colace, no BM since admission  - Zofran as needed for nausea    GU: Hx of  mixed incontinence and recent UTI  - Hx E.coli UTI from Cx 11/01/18 - susceptible to ampicillin, cefazolin, cephalexin, ciprofloxacin, gentamicin, levofloxacin, nitrofurantoin, tobramycin, TMP/SMX  - UA 8/26 large leuk, >56 WBC  - UCx 8/27 with no growth  - Adequate UO, TOV today    Heme/ID: Abscess and cellulitis of labia and perianal area  - OR 8/26 for  I&D of abscesses on R labia majora and left medial buttocks.   - Penrose drain to buttocks, labial wound with wet to dry packing, change BID, sitz baths TID.   - Afebrile  - WBC 5.2  - H/H stable  - 8/25 Blood cx x2 NGTD  - 8/27 Urine Cx pending  - 8/26 wound cx, TYTR (mixed gram +/- organisms)  - Vanc/Zosyn 8/25-8/28, transitioned to Cipro and Flagyl per ID recs given multiple drug allergies, will plan for 10 day course.  Check EKG for baseline QTC given that cipro + seroquel can cause prolonged QTC  *of note patient with recent hx of R great toe osteomyelitis w/ MRSA (Sensitive to doxy, gent, linezolid, TMP-SMX, vanc; Resistant to clinda, erythromycin, nafcillin) s/p amputations and completion of 6 week IV Vanc/ertapenum course.       Endo: T2DM, Hypothyroidism  - Last hgb A1c 08/2018: 13.4, repeat 8/28 also 13.4  - Diabetes educator consulted to assess discharge need, pt felt like she had a good understanding of her DM management and needs  - Home Meds: Lantus 65 un nighty, Humalog 20 units TID, Metformin BID  - Resume lantus at 50 units nightly, will titrate as needed to home dose.   - Continue Home lantus, 20 u lispro prior to meals SSI, holding metformin for now.   - Previously taking synthroid, has not filled since December 2019, patient will follow up with PCP.   - Medicine consulted to  assist with medication management r/t T2DM, HgA1C 13.5 - rec continuing all home meds while inpatient and f/u with PCP or Endocrinology as a referral has already been placed  - TSH WNL, home synthroid reordered while inpatient, f/u with PCP     Rheumatoid arthritis   - Continue home prednisone taper (per 8/3 office note: Take 20 mg daily x 2 weeks, 15 mg daily x 2 weeks, 10 mg daily x 2 weeks, then 5 mg until visit with rheumatology)  - Patient reports currently taking 10 mg, will continue 10 mg for 4 days (8/27-8/30) and then transition to 5 mg with rheum follow up.   - Follow up with rheumatology as previously arranged re: weekly methotrexate and weekly tocilizumab     PPx:  - heparin 7500 q8h    Dispo: Floor  -  PT/OT 3x, patient reports good support by daughter at home whom she lives with and is comfortable with wound care, has declined HH     Subjective:   Pain much improved from yesterday since addition of toradol  Objective:     Vital signs in last 24 hours:  Temp:  [36.1 ??C (97 ??F)-36.8 ??C (98.2 ??F)] 36.8 ??C (98.2 ??F)  Heart Rate:  [70-73] 70  Resp:  [16-18] 16  BP: (114-129)/(68-80) 129/71  MAP (mmHg):  [82-97] 96  SpO2:  [93 %-96 %] 93 %    Intake/Output last 24 hours:  I/O last 3 completed shifts:  In: 600 [P.O.:300; IV Piggyback:300]  Out: 1725 [Urine:1725]    Physical Exam:  General: Lying in bed, tearful, shifting uncomfortably   Neuro: A&O x3  Pulm: NWOB on RA  Abd: soft, non tender, non distended  Ext: WWP  GU: 2cm incision site on right labia majora with surrounding erythema and induration, 2x2cm incisions on left medial buttocks with penrose drain in place, with surrounding mild erythema and induration, mildly TTP    Data Review:    All lab results last 24 hours:    Recent Results (from the past 24 hour(s))   POCT Glucose    Collection Time: 12/15/18  5:35 PM   Result Value Ref Range    Glucose, POC 221 (H) 70 - 179 mg/dL   POCT Glucose    Collection Time: 12/15/18  9:14 PM   Result Value Ref Range    Glucose, POC 177 70 - 179 mg/dL   CBC    Collection Time: 12/16/18  4:05 AM   Result Value Ref Range    WBC 3.7 (L) 4.5 - 11.0 10*9/L    RBC 4.21 4.00 - 5.20 10*12/L    HGB 10.9 (L) 12.0 - 16.0 g/dL    HCT 16.1 (L) 09.6 - 46.0 %    MCV 80.7 80.0 - 100.0 fL    MCH 25.9 (L) 26.0 - 34.0 pg    MCHC 32.2 31.0 - 37.0 g/dL    RDW 04.5 (H) 40.9 - 15.0 %    MPV 8.8 7.0 - 10.0 fL    Platelet 153 150 - 440 10*9/L   Basic Metabolic Panel    Collection Time: 12/16/18  4:05 AM   Result Value Ref Range    Sodium 135 135 - 145 mmol/L    Potassium 4.2 3.5 - 5.0 mmol/L    Chloride 105 98 - 107 mmol/L    CO2 18.0 (L) 22.0 - 30.0 mmol/L    Anion Gap 12 7 - 15 mmol/L    BUN 11 7 - 21 mg/dL  Creatinine 0.78 0.60 - 1.00 mg/dL BUN/Creatinine Ratio 14     EGFR CKD-EPI Non-African American, Female 85 >=60 mL/min/1.90m2    EGFR CKD-EPI African American, Female >90 >=60 mL/min/1.48m2    Glucose 117 70 - 179 mg/dL    Calcium 8.6 8.5 - 16.1 mg/dL   Magnesium Level    Collection Time: 12/16/18  4:05 AM   Result Value Ref Range    Magnesium 1.8 1.6 - 2.2 mg/dL   Phosphorus Level    Collection Time: 12/16/18  4:05 AM   Result Value Ref Range    Phosphorus 4.0 2.9 - 4.7 mg/dL   POCT Glucose    Collection Time: 12/16/18  7:53 AM   Result Value Ref Range    Glucose, POC 111 70 - 179 mg/dL   POCT Glucose    Collection Time: 12/16/18 11:45 AM   Result Value Ref Range    Glucose, POC 223 (H) 70 - 179 mg/dL       Imaging: Radiology studies were personally reviewed

## 2018-12-16 NOTE — Unmapped (Signed)
AAOX3. VSS. Afebrile. Safety measures met. States she is in a lot of pain. Gave Toradol and oxy and she rested through the night. Free from falls or injury. Bed in low and locked position. Will continue to monitor.  Problem: Skin Injury Risk Increased  Goal: Skin Health and Integrity  Outcome: Progressing     Problem: Adult Inpatient Plan of Care  Goal: Plan of Care Review  Outcome: Progressing     Problem: Adult Inpatient Plan of Care  Goal: Absence of Hospital-Acquired Illness or Injury  Outcome: Progressing     Problem: Adult Inpatient Plan of Care  Goal: Optimal Comfort and Wellbeing  Outcome: Progressing     Problem: Adult Inpatient Plan of Care  Goal: Readiness for Transition of Care  Outcome: Progressing     Problem: Adult Inpatient Plan of Care  Goal: Rounds/Family Conference  Outcome: Progressing

## 2018-12-17 LAB — CBC
HEMATOCRIT: 33.4 % — ABNORMAL LOW (ref 36.0–46.0)
HEMOGLOBIN: 10.9 g/dL — ABNORMAL LOW (ref 12.0–16.0)
MEAN CORPUSCULAR HEMOGLOBIN CONC: 32.6 g/dL (ref 31.0–37.0)
MEAN CORPUSCULAR HEMOGLOBIN: 26.6 pg (ref 26.0–34.0)
MEAN CORPUSCULAR VOLUME: 81.6 fL (ref 80.0–100.0)
MEAN PLATELET VOLUME: 7.7 fL (ref 7.0–10.0)
RED CELL DISTRIBUTION WIDTH: 19.6 % — ABNORMAL HIGH (ref 12.0–15.0)
WBC ADJUSTED: 4.1 10*9/L — ABNORMAL LOW (ref 4.5–11.0)

## 2018-12-17 LAB — BASIC METABOLIC PANEL
ANION GAP: 7 mmol/L (ref 7–15)
BUN / CREAT RATIO: 14
CALCIUM: 8.5 mg/dL (ref 8.5–10.2)
CHLORIDE: 104 mmol/L (ref 98–107)
CO2: 25 mmol/L (ref 22.0–30.0)
CREATININE: 1.04 mg/dL — ABNORMAL HIGH (ref 0.60–1.00)
EGFR CKD-EPI NON-AA FEMALE: 60 mL/min/{1.73_m2} (ref >=60)
GLUCOSE RANDOM: 162 mg/dL (ref 70–179)
POTASSIUM: 3.5 mmol/L (ref 3.5–5.0)
SODIUM: 136 mmol/L (ref 135–145)

## 2018-12-17 LAB — SODIUM: Sodium:SCnc:Pt:Ser/Plas:Qn:: 136

## 2018-12-17 LAB — HEMATOCRIT: Hematocrit:VFr:Pt:Bld:Qn:: 33.4 — ABNORMAL LOW

## 2018-12-17 LAB — PHOSPHORUS: Phosphate:MCnc:Pt:Ser/Plas:Qn:: 4.8 — ABNORMAL HIGH

## 2018-12-17 LAB — MAGNESIUM: Magnesium:MCnc:Pt:Ser/Plas:Qn:: 1.9

## 2018-12-17 MED ORDER — METRONIDAZOLE 500 MG TABLET: 500 mg | tablet | Freq: Three times a day (TID) | 0 refills | 9 days | Status: AC

## 2018-12-17 MED ORDER — METRONIDAZOLE 500 MG TABLET
ORAL_TABLET | Freq: Three times a day (TID) | ORAL | 0 refills | 9.00000 days | Status: CP
Start: 2018-12-17 — End: 2018-12-17
  Filled 2018-12-18: qty 21, 7d supply, fill #0

## 2018-12-17 MED ORDER — CIPROFLOXACIN 500 MG TABLET
ORAL_TABLET | Freq: Two times a day (BID) | ORAL | 0 refills | 7.00000 days | Status: CP
Start: 2018-12-17 — End: 2018-12-25
  Filled 2018-12-18: qty 14, 7d supply, fill #0

## 2018-12-17 MED ORDER — CIPROFLOXACIN 500 MG TABLET: 500 mg | tablet | Freq: Two times a day (BID) | 0 refills | 9 days | Status: AC

## 2018-12-17 MED ORDER — OXYCODONE 5 MG TABLET
ORAL_TABLET | ORAL | 0 refills | 4.00000 days | Status: CP | PRN
Start: 2018-12-17 — End: 2018-12-24
  Filled 2018-12-18: qty 20, 4d supply, fill #0

## 2018-12-17 NOTE — Unmapped (Signed)
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     Neuro: Acute on Chronic pain   - Add scheduled APAP, toradol - pain much more controlled today  - Continue PRN oxy   - Continue home pregabalin 150mg  TID  - Of note, patient followed by chronic pain (prescribed oxycodone 5 mg tablets, endorses taking 4 tablets daily). Confirmed in PDMP.     Anxiety, bi polar disorder, depression   - Followed outpatient by psychiatry, confirmed active medications, doses and regular refills with pharmacy   - Continue home Seroquel (300 mg nightly ), Prozac (20 mg nightly), buspar (10 mg BID) and Abilify (5 mg daily)  - holding home Ambien while inpatient  - Medicine team consulted, agree with continuing all home meds and holding ambien    CV: HTN  - HDS  - Hold home lisinopril for now, may resume at discharge     Resp:  - Stable on RA  - Continue pulmonary toliet: OOB and IS    Fen/GI:  - Medocked  - Lytes: replete as needed. Mg replaced today for goal of 2  - Tolerating Reg diet  - Continue miralax, senna and colace  - Zofran as needed for nausea    GU: Hx of  mixed incontinence and recent UTI  - Hx E.coli UTI from Cx 11/01/18 - susceptible to ampicillin, cefazolin, cephalexin, ciprofloxacin, gentamicin, levofloxacin, nitrofurantoin, tobramycin, TMP/SMX  - UA 8/26 large leuk, >56 WBC  - UCx 8/27 with no growth  - Borderline UO with mild bump in Cr to 1.04,  Foley removed yesterday, voided once yesterday then required in and out overnight, will attempt to void again independently today and monitor PVR, 1L bolus given    Heme/ID: Abscess and cellulitis of labia and perianal area  - OR 8/26 for  I&D of abscesses on R labia majora and left medial buttocks.   - Penrose drain to buttocks, labial wound with wet to dry packing, change BID, sitz baths TID.   - Afebrile  - WBC 5.2  - H/H stable  - 8/25 Blood cx x2 NGTD  - 8/27 Urine Cx pending  - 8/26 wound cx, TYTR (mixed gram +/- organisms)  - Vanc/Zosyn 8/25-8/28, transitioned to Cipro and Flagyl per ID recs given multiple drug allergies, will plan for 10 day course.  Check EKG for baseline QTC given that cipro + seroquel can cause prolonged QTC, 445 on 8/29  *of note patient with recent hx of R great toe osteomyelitis w/ MRSA (Sensitive to doxy, gent, linezolid, TMP-SMX, vanc; Resistant to clinda, erythromycin, nafcillin) s/p amputations and completion of 6 week IV Vanc/ertapenum course.       Endo: T2DM, Hypothyroidism  - Last hgb A1c 08/2018: 13.4, repeat 8/28 also 13.4  - Diabetes educator consulted to assess discharge need, pt felt like she had a good understanding of her DM management and needs  - Home Meds: Lantus 65 un nighty, Humalog 20 units TID, Metformin BID  - Resume lantus at 50 units nightly, will titrate as needed to home dose.   - Continue Home lantus, 20 u lispro prior to  meals SSI, holding metformin for now.   - Previously taking synthroid, has not filled since December 2019, patient will follow up with PCP.   - Medicine consulted to assist with medication management r/t T2DM, HgA1C 13.5 - rec continuing all home meds while inpatient and f/u with PCP or Endocrinology as a referral has already been placed  - TSH WNL, home synthroid reordered while inpatient, f/u with PCP     Rheumatoid arthritis   - Continue home prednisone taper (per 8/3 office note: Take 20 mg daily x 2 weeks, 15 mg daily x 2 weeks, 10 mg daily x 2 weeks, then 5 mg until visit with rheumatology)  - Patient reports currently taking 10 mg, will continue 10 mg for 4 days (8/27-8/30) and then transition to 5 mg with rheum follow up.   - Follow up with rheumatology as previously arranged re: weekly methotrexate and weekly tocilizumab     PPx:  - heparin 7500 q8h    Dispo: Floor  -  PT/OT 3x, patient reports good support by daughter at home whom she lives with and is comfortable with wound care, has declined HH   - Anticipate d/c home today vs tomorrow pending ability to void    Attestation:    I was supervising physician for this care.   Hilbert Briggs        Subjective:   Pain much improved from yesterday since addition of toradol  Objective:     Vital signs in last 24 hours:  Temp:  [35.8 ??C (96.4 ??F)-36.8 ??C (98.2 ??F)] 35.8 ??C (96.4 ??F)  Heart Rate:  [70-83] 83  Resp:  [16-17] 17  BP: (129-137)/(71-72) 137/72  MAP (mmHg):  [95-96] 95  SpO2:  [93 %-95 %] 95 %    Intake/Output last 24 hours:  I/O last 3 completed shifts:  In: 1200 [P.O.:1200]  Out: 1025 [Urine:1025]    Physical Exam:  General: Lying in bed, tearful, shifting uncomfortably   Neuro: A&O x3  Pulm: NWOB on RA  Abd: soft, non tender, non distended  Ext: WWP  GU: 2cm incision site on right labia majora with surrounding erythema and induration, 2x2cm incisions on left medial buttocks with penrose drain in place, with improving erythema and induration, mildly TTP    Data Review:    All lab results last 24 hours:    Recent Results (from the past 24 hour(s))   POCT Glucose    Collection Time: 12/16/18 11:45 AM   Result Value Ref Range    Glucose, POC 223 (H) 70 - 179 mg/dL   ECG 12 Lead    Collection Time: 12/16/18  4:17 PM   Result Value Ref Range    EKG Systolic BP  mmHg    EKG Diastolic BP  mmHg    EKG Ventricular Rate 79 BPM    EKG Atrial Rate 79 BPM    EKG P-R Interval 124 ms    EKG QRS Duration 74 ms    EKG Q-T Interval 390 ms    EKG QTC Calculation 447 ms    EKG Calculated P Axis 39 degrees    EKG Calculated R Axis 2 degrees    EKG Calculated T Axis 30 degrees    QTC Fredericia 427 ms   POCT Glucose    Collection Time: 12/16/18  5:47 PM   Result Value Ref Range Glucose, POC 315 (H) 70 - 179 mg/dL   POCT Glucose    Collection Time: 12/16/18  9:58 PM  Result Value Ref Range    Glucose, POC 209 (H) 70 - 179 mg/dL   CBC    Collection Time: 12/17/18  4:38 AM   Result Value Ref Range    WBC 4.1 (L) 4.5 - 11.0 10*9/L    RBC 4.10 4.00 - 5.20 10*12/L    HGB 10.9 (L) 12.0 - 16.0 g/dL    HCT 16.1 (L) 09.6 - 46.0 %    MCV 81.6 80.0 - 100.0 fL    MCH 26.6 26.0 - 34.0 pg    MCHC 32.6 31.0 - 37.0 g/dL    RDW 04.5 (H) 40.9 - 15.0 %    MPV 7.7 7.0 - 10.0 fL    Platelet 215 150 - 440 10*9/L   Basic Metabolic Panel    Collection Time: 12/17/18  4:38 AM   Result Value Ref Range    Sodium 136 135 - 145 mmol/L    Potassium 3.5 3.5 - 5.0 mmol/L    Chloride 104 98 - 107 mmol/L    CO2 25.0 22.0 - 30.0 mmol/L    Anion Gap 7 7 - 15 mmol/L    BUN 15 7 - 21 mg/dL    Creatinine 8.11 (H) 0.60 - 1.00 mg/dL    BUN/Creatinine Ratio 14     EGFR CKD-EPI Non-African American, Female 60 >=60 mL/min/1.58m2    EGFR CKD-EPI African American, Female 58 >=60 mL/min/1.59m2    Glucose 162 70 - 179 mg/dL    Calcium 8.5 8.5 - 91.4 mg/dL   Magnesium Level    Collection Time: 12/17/18  4:38 AM   Result Value Ref Range    Magnesium 1.9 1.6 - 2.2 mg/dL   Phosphorus Level    Collection Time: 12/17/18  4:38 AM   Result Value Ref Range    Phosphorus 4.8 (H) 2.9 - 4.7 mg/dL   POCT Glucose    Collection Time: 12/17/18  7:44 AM   Result Value Ref Range    Glucose, POC 164 70 - 179 mg/dL       Imaging: Radiology studies were personally reviewed

## 2018-12-17 NOTE — Unmapped (Signed)
PT progressing, alert and oriented, vss, ra. Pt dtv, unable to void/I and O x1, Loose stools x 2. Pain managed with PO oxycodone. Safety, contact precautions maintained; call bell within reach, bed low and locked. WCM.    Problem: Adult Inpatient Plan of Care  Goal: Plan of Care Review  Outcome: Progressing  Goal: Patient-Specific Goal (Individualization)  Outcome: Progressing  Goal: Absence of Hospital-Acquired Illness or Injury  Outcome: Progressing  Goal: Optimal Comfort and Wellbeing  Outcome: Progressing  Goal: Readiness for Transition of Care  Outcome: Progressing  Goal: Rounds/Family Conference  Outcome: Progressing     Problem: Skin Injury Risk Increased  Goal: Skin Health and Integrity  Outcome: Progressing

## 2018-12-17 NOTE — Unmapped (Signed)
Discharge Summary    Admit date: 12/12/2018    Discharge date and time: 12/18/18    Discharge to:  Home    Discharge Service: [facility]    Discharge Attending Physician:  Dr. Leonette Most    Discharge  Diagnoses: Perirectal abscess    OR Procedures:    Left - INCISION AND DRAINAGE, PERIANAL ABSCESS, SUPERFICIAL  Left - Incision And Drainage Of Vulva Or Perineal Abscess  Date  12/13/2018  -------------------     Discharge Day Services: The patient was seen and examined by the Trauma Surgery team on the day of discharge. Vital signs and laboratory values were stable and within normal limits. Wounds were examined and erythema and induration were improving. Discharge plan was discussed, instructions were given and all questions answered..  Discussed abx and risk of QTC prolongation, pt reports she has been on cipro in the past and has never had any adverse effects.  QTC 404 on day of discharge with 6 days of abx left. Pt was able to void with low PVR prior to leaving.  Discussed that if she has issues with urinating at home she should contact us.   Will see in clinic in 10-14 days for wound check and penrose drain removal, f/u requested.    Subjective   No acute events overnight. Pain Controlled.    Objective   No data found.  No intake/output data recorded.    Hospital Course:  Amanda Hanson 58 y.o. 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      The patient was taken to the OR on 12/13/2018 for  EUA, I&D of perianal and labial abscess.  Operative findings were Gluteal/perianal abscess with large cavity; penrose drain placed. Labial abscess with small cavity; packing placed.  The procedure itself was uneventful and without complications. She tolerated the procedure well, was extubated in the OR, and received routine post-operative care before being transferred to the floor.    She was started on broad spectrum antibiotics on arrival (Vanc (8/26-8/28) and Zosyn (8/26- 8/29). Cultures grew with mixed gram neg and positive and she was transitioned to PO cipro and flaygl for an additional 10 day course of antibiotics. ID was e-consulted for abx assistance given her multiple drug allergies and recommended transitioning to cipro and flagyl for duration of abx course.  EKG obtained for baseline QTC which showed QTC of 447, QTC down to 404 after 2 days of taking cipro.  Discussed with patient and she reports she has tolerated cipro well in the past with no adverse effects, so will do 7 more days of antibiotics and then see in clinic for re-evaluation.      Medicine team consulted while inpatient to assist with DM management and med reconciliation, agreed with continuing all home meds and outpatient follow up.  Will continue steroid taper at discharge as directed by Rheumatology.    Urinalysis performed on admission with concern for UTI, urine culture showed no growth.   A foley was placed for urinary retention on 8/26. She underwent  TOV on 8/29, voided once, then required in and out cath x1.  On day of discharge she was able to void with low PVR, given strict f/u precautions.     Her diet was advanced and by discharge she was tolerating a regular diet. She was voiding  adequately, ambulating independently, and her pain was controlled wth PO pain medications. She was examined by the Trauma Surgery team on the day of discharge and was deemed suitable for discharge home. She will be discharged on POD #5 in good condition.    I spent greater than 30 minutes completing this discharge.       Condition at Discharge: Improved  Discharge Medications:      Medication List      START taking these medications    ??? ciprofloxacin HCl 500 MG tablet; Commonly known as: CIPRO; Take 1 tablet   (500 mg total) by mouth every twelve (12) hours for 9 days.  ??? metroNIDAZOLE 500 MG tablet; Commonly known as: FLAGYL; Take 1 tablet   (500 mg total) by mouth Three (3) times a day for 9 days.     CHANGE how you take these medications    ??? oxyCODONE 5 MG immediate release tablet; Commonly known as: ROXICODONE;   Take 1 tablet (5 mg total) by mouth every four (4) hours as needed for up   to 7 days.; What changed: how much to take, how to take this, when to take   this, reasons to take this     CONTINUE taking these medications    ??? acetaminophen 500 MG tablet; Commonly known as: TYLENOL  ??? ACTEMRA ACTPEN 162 mg/0.9 mL Pnij; Generic drug: tocilizumab; Inject the   contents of 1 pen (162 mg) under the skin every seven (7) days.  ??? ARIPiprazole 5 MG tablet; Commonly known as: ABILIFY  ??? atorvastatin 80 MG tablet; Commonly known as: LIPITOR; Take 1 tablet (80   mg total) by mouth every evening.  ??? blood sugar diagnostic Strp; Commonly known as: ACCU-CHEK GUIDE TEST   STRIPS; by Other route Four (4) times a day (before meals and nightly).   Acuu-chek Guide.  ??? busPIRone 10 MG tablet; Commonly known as: BUSPAR  ??? calcium-vitamin D 250-100 mg-unit per tablet; Take 1 tablet by mouth Two   (2) times a day.  ??? diclofenac sodium 1 % gel; Commonly known as: VOLTAREN  ??? empty container Misc; Use as directed  ??? FLUoxetine 20 MG capsule; Commonly known as: PROzac  ??? folic acid 1 MG tablet; Commonly known as: FOLVITE; Take 1 tablet (1 mg   total) by mouth daily.  ??? insulin lispro 100 unit/mL injection; Commonly known as: HumaLOG U-100   Insulin; INJECT 0.20ML (20 UNITS TOTAL) UNDER THE SKIN THREE TIMES A DAY   BEFORE MEALS.  ??? insulin syringes (disposable) 1 mL Syrg; 100 Units by Miscellaneous   route daily. Insulin injecting diabetes 250.00 Inject daily 100 unit   syringe (1ml)  ??? LANTUS U-100 INSULIN 100 unit/mL injection; Generic drug: insulin   glargine; Inject 0.65 mL (65 Units total) under the skin daily.  ??? levothyroxine 88 MCG tablet; Commonly known as: SYNTHROID; TAKE 1 TABLET   BY MOUTH EVERY DAY  ??? lisinopriL 2.5 MG tablet; Commonly known as: PRINIVIL,ZESTRIL; Take 1   tablet (2.5 mg total) by mouth daily.  ??? LYRICA 150 MG capsule; Generic drug: pregabalin  ??? melatonin 3 mg Tab; Take 1 tablet (3 mg total) by mouth nightly as   needed.  ??? metFORMIN 500 MG tablet; Commonly known as: GLUCOPHAGE; Take 2 tablets   (1,000 mg total) by mouth 2 (two) times a day with meals.  ??? methotrexate 25 mg/mL injection solution; Inject 1 mL (25 mg total)   under the skin once a week.  ???  nystatin 100,000 unit/gram powder; Commonly known as: MYCOSTATIN  ??? polyethylene glycol 17 gram packet; Commonly known as: MIRALAX; Take 17   g by mouth daily.  ??? predniSONE 5 MG tablet; Commonly known as: DELTASONE; Take 20 mg daily x   2 weeks, 15 mg daily x 2 weeks, 10 mg daily x 2 weeks, then 5 mg until   visit with rheumatology  ??? promethazine 25 MG tablet; Commonly known as: PHENERGAN  ??? QUEtiapine 300 MG tablet; Commonly known as: SEROquel  ??? senna 8.6 mg tablet; Commonly known as: SENOKOT; Take 2 tablets by mouth   nightly as needed for constipation.  ??? solifenacin 5 MG tablet; Commonly known as: VESICARE; Take 1 tablet (5   mg total) by mouth daily.  ??? zolpidem 5 MG tablet; Commonly known as: AMBIEN     Instructions    DIET: You may advance your diet to regular, as tolerated. You may experience bloating or have loose, watery stool for several days after surgery.    INCISION: Continue to wash perineal site and perirectal site daily with pericare bottle and in shower.  Should clean after using the bathroom as well.  Will continue antibiotics for additional 6 days (ciprofloxacin and flagyl).  Do not drink alcohol while taking flagyl as it will make you nauseous.  If you develop heart palpitations or adverse reaction please stop antibiotics and call our office.  As we discussed ciprofloxacin can prolong QTC interval when combined with seroquel, but you have tolerated it well in the past and are on a short course of antibiotic medication.    - Continue all home medications as prescribed and monitor BG at home.  HgA1C while inpatient was 13.5.  Follow up with your PCP and rheumatologist as planned.  May continue with your steroid taper at discharge.    ACTIVITY: You may shower after 24 hours, but do not soak in a tub until wound has healed.  Increase activity as tolerated.    PAIN MEDICATION: Do not drive or drink alcohol while taking prescription pain medication. Take your prescribed pain medication only as needed. You may decrease the amount of pain medication as tolerated, and take Tylenol or a Motrin product as directed on the label.?? Pain medication can cause constipation. To avoid this, you should take an over-the-counter laxative such as miralax- which we have prescribed for you.    Effective October 16, 2015, the [place] signed a [address] that works to address the opioid crisis our state is facing.  It is called the STOP Law, for Strengthen Opioid Misuse Prevention.       As part of this law, we are limited to prescribing no more than a seven-day supply of opioid pain medicine for our surgery patients initally.  (This law also limits prescriptions for patients who have not had surgery to a five-day supply.)     Because opioid prescriptions cannot be renewed electronically or over the phone, a paper prescription must be presented to the pharmacy.  Therefore, a patient must contact our office at least 2-3 business days before you anticipate you will run out of pain medication so that we can develop a plan for you.  The best number to call is [phone].  The on-call provider will NOT be able to renew your prescription.     The STOP Act also monitors the amount of controlled substances that each patient has received from any and all providers.  Prescriptions for each patient will be monitored  by the [place] Controlled Substances Reporting System in accordance with the law.  Thank you for working with [name] so we can provide good pain control in the safest way possible while you recover.     Please note that prescription pain medication refills will not be called into your pharmacy. If you feel that you are needing more pain medication, you will need to be seen in clinic or the Emergency Department for further evaluation to ensure you are not experiencing a complication. Please take note of how many pills you have left in your bottle, and if you are starting to run low and feel that you will need more for adequate pain control, to call the clinic as soon as possible to schedule an appointment, as our clinics are very busy, and we cannot guarantee same week appointments.    The Trauma Surgery office number is [phone]. For emergencies at night or on the week-end, please call [phone]) and ask for the surgery resident on call.    WHEN TO CALL THE SURGEON'S OFFICE:  1. Thick, yellow drainage from your incisions, redness at incisions, bleeding, or separations of wounds.  2. Temperature greater than 101.5  3. Uncontrolled nausea or vomiting.  4. Pain that is not controlled by your pain medication     May take Motrin 600mg  with breakfast, lunch, dinner and before bed time to help with pain.     Standard Patient Notification Script  We care deeply about how our patients' pain is managed after surgery.  During the month after you have surgery, you may receive a call from [name] asking about your postsurgical pain and about how you managed that pain.  The survey will take approximately 5 to 10 minutes and will help [name] to provide better care to our patients.  Thank you in advance for your participation and please answer any calls coming from the [facility] or [place] area!    Please follow-up with your primary care physician (PCP) to discuss the following incidental findings noted on your imaging:  - Unchanged fat and fluid-containing ventral hernias - Signs and symptoms of strangulated hernia that would require immediate attention or emergency care include: nausea, vomiting, fever, severe pain, hernia bulge that turns red or purple, or inability to move bowels or pass gas.  If you experience these symptoms please seek medical care  ??- Persistent borderline splenomegaly. Hepatomegaly and hepatic steatosis, similar to prior. Follow-up with PCP  - Mild bladder wall thickening - Likely reactive to infection, UC was negative so no treatment needed.  - Subtle foci of groundglass opacification along the dependent aspect of the right lower lobe - No signs of pneumonia while inpatient, could be inflammatory or atelectasis.  Please be seen by your PCP if you develop SOB, trouble breathing or s/s of pneumonia.    Follow Up  [facility] and Trauma Surgery  [address]  [facility] - First Floor  [place], [place]  [address]    Contact Information for Nurses:  [phone]: Waynetta Sandy  [phone]: Maralyn Sago    If need to change your appointment:  [phone]    Fax: [phone]  Follow-up 10-14 days for wound check.  You will receive a phone call in 1-3 weeks to discuss post op course. If having concerns please call the clinic to schedule an appointment.   If you have not been contacted regarding an follow-up appointment date and time within a few days, please call to schedule.  Please note, if you arrive more than 20 minutes late, you will need to reschedule your appointment      *For support and information on substance abuse you may call the [facility]. It is free, confidential 24/7, 365 day-a-year treatment referral and information service (in [place] and [place]) for individuals and families facing mental and/or substance use disorders. 1-800-662-HELP (4357).      Future Appointments:  Appointments which have been scheduled for you    Jan 17, 2019 10:30 AM  (Arrive by 10:15 AM)  NEW PATIENT VIDEO APPOINTMENT with Patrecia Pace  [facility] [place] [facility] REGION) [address]  [address]  [place] [address]  [phone]   Please sign in to My [place] Chart at least 15 minutes before your appointment to complete any unfinished steps in the Get Started process.  Get Started is required to be completed prior to starting your video visit.    If this is an emergency, call 911 or go to the nearest urgent care or emergency room.          Feb 01, 2019  9:20 AM  (Arrive by 9:05 AM)  OFFICE VISIT with Loran Senters, FNP  [facility] at [facility] REGION) [address]  [place] [address]  [phone]      Mar 12, 2019  3:00 PM  (Arrive by 2:45 PM)  RETURN  RHEUMATOLOGY with Tommy Rainwater, MD  [facility] RHEUMATOLOGY [address] [facility] REGION) [address]  [place] [address]  [phone]           ATTENDING ATTESTATION:    I saw and evaluated the patient, participating in key portions of the service on the day of discharge.  I reviewed the resident's note and agree with the discharge plans and disposition.  I personally spent less than 30 minutes in discharge planning service.   Celso Amy MD

## 2018-12-18 LAB — MAGNESIUM: Magnesium:MCnc:Pt:Ser/Plas:Qn:: 1.7

## 2018-12-18 LAB — CBC
HEMATOCRIT: 32.2 % — ABNORMAL LOW (ref 36.0–46.0)
HEMOGLOBIN: 10.5 g/dL — ABNORMAL LOW (ref 12.0–16.0)
MEAN CORPUSCULAR HEMOGLOBIN CONC: 32.5 g/dL (ref 31.0–37.0)
MEAN CORPUSCULAR HEMOGLOBIN: 26.9 pg (ref 26.0–34.0)
MEAN PLATELET VOLUME: 7.1 fL (ref 7.0–10.0)
PLATELET COUNT: 208 10*9/L (ref 150–440)
RED BLOOD CELL COUNT: 3.89 10*12/L — ABNORMAL LOW (ref 4.00–5.20)
RED CELL DISTRIBUTION WIDTH: 19.1 % — ABNORMAL HIGH (ref 12.0–15.0)

## 2018-12-18 LAB — BASIC METABOLIC PANEL
ANION GAP: 3 mmol/L — ABNORMAL LOW (ref 7–15)
BLOOD UREA NITROGEN: 14 mg/dL (ref 7–21)
CHLORIDE: 109 mmol/L — ABNORMAL HIGH (ref 98–107)
CO2: 24 mmol/L (ref 22.0–30.0)
CREATININE: 0.88 mg/dL (ref 0.60–1.00)
EGFR CKD-EPI AA FEMALE: 84 mL/min/{1.73_m2} (ref >=60)
EGFR CKD-EPI NON-AA FEMALE: 73 mL/min/{1.73_m2} (ref >=60)
GLUCOSE RANDOM: 148 mg/dL (ref 70–179)
POTASSIUM: 3.6 mmol/L (ref 3.5–5.0)
SODIUM: 136 mmol/L (ref 135–145)

## 2018-12-18 LAB — PHOSPHORUS: Phosphate:MCnc:Pt:Ser/Plas:Qn:: 4.2

## 2018-12-18 LAB — POTASSIUM: Potassium:SCnc:Pt:Ser/Plas:Qn:: 3.6

## 2018-12-18 LAB — MEAN PLATELET VOLUME: Lab: 7.1

## 2018-12-18 MED FILL — OXYCODONE 5 MG TABLET: 4 days supply | Qty: 20 | Fill #0 | Status: AC

## 2018-12-18 MED FILL — CIPROFLOXACIN 500 MG TABLET: 7 days supply | Qty: 14 | Fill #0 | Status: AC

## 2018-12-18 MED FILL — METRONIDAZOLE 500 MG TABLET: 7 days supply | Qty: 21 | Fill #0 | Status: AC

## 2018-12-18 NOTE — Unmapped (Addendum)
INPATIENT TREATMENT PLAN NOTE - Medicine Consult Team      Interval History:   No acute events overnight.  Vital signs remained stable.  Plan is for likely discharge .    Assessment/Recs:    Uncontrolled type 2 diabetes.  After restarting the patient's home insulin regimen her sugars have been overall better controlled over the last 24 to 48 hours.  With only one blood sugar greater than 180 over the last 24 hours while receiving her home dose Lantus 65 units, lispro 20 units 3 times daily with meals.  Did receive an additional 8 units of sliding scale.  Suspect that patient struggles with compliance at home for a variety of reasons.  Patient states she does have a daughter at home who may be able to help her with some of her medication administration.  She was seen by the diabetes educator while admitted here.  Of note, patient is currently undergoing a prednisone taper as an outpatient will need to have somebody continue to monitor her blood sugars and her insulin requirements closely as an outpatient, to make further adjustments to insulin as she comes off of the steroids.    -We will need close primary care physician follow-up to continue to monitor her glucose and titrate insulin accordingly, especially while tapering down on steroids. I have sent a message to pt's PCP to see if they can help set up an appointment.   -It appears that her primary care physician had placed a referral to endocrinology previously, and currently has an appointment scheduled for 01/17/2019.  Very important that patient make this appointment.  - No other changes to home meds at this time, as she is followed closely by outpt mental health and pain teams.         Thank you for this consult. They patient was Discussed with  Dr. Lauris Chroman. We will sign off at this time.      Heron Sabins, MD

## 2018-12-18 NOTE — Unmapped (Signed)
Patient remains free from injury during shift. VSS on RA. Patient up ad lib and walking around unit during shift. Patient tolerating regular diet with no reports of nausea. Trial of void ongoing - void x1. Patient had loose stools overnight with additional occurrence today - MD notified. Pain managed with prn PO oxycodone. Will continue to monitor    Problem: Wound  Goal: Optimal Wound Healing  Outcome: Progressing     Problem: Skin Injury Risk Increased  Goal: Skin Health and Integrity  Outcome: Progressing     Problem: Adult Inpatient Plan of Care  Goal: Plan of Care Review  Outcome: Progressing  Goal: Patient-Specific Goal (Individualization)  Outcome: Progressing  Goal: Absence of Hospital-Acquired Illness or Injury  Outcome: Progressing  Goal: Optimal Comfort and Wellbeing  Outcome: Progressing  Goal: Readiness for Transition of Care  Outcome: Progressing  Goal: Rounds/Family Conference  Outcome: Progressing     Problem: Infection  Goal: Infection Symptom Resolution  Outcome: Progressing     Problem: COPD Comorbidity  Goal: Maintenance of COPD Symptom Control  Outcome: Progressing     Problem: Heart Failure Comorbidity  Goal: Maintenance of Heart Failure Symptom Control  Outcome: Progressing     Problem: Obstructive Sleep Apnea Risk or Actual (Comorbidity Management)  Goal: Unobstructed Breathing During Sleep  Outcome: Progressing

## 2018-12-18 NOTE — Unmapped (Signed)
Alert and oriented x4. VSS. Pt pain control with oxy. Wound site looked free of infection. Pt passed tov. No other acute changes this shift.

## 2018-12-26 NOTE — Unmapped (Signed)
[facility] Clinical Assessment & Refill Coordination Note    Ambrielle Kington, IXL: 07-23-1960  Phone: [phone] (home)     All above HIPAA information was verified with patient.     Specialty Medication(s):   Inflammatory Disorders: Actemra     Current Outpatient Medications   Medication Sig Dispense Refill   ??? acetaminophen (TYLENOL) 500 MG tablet Take 500 mg by mouth Three (3) times a day as needed for pain.     ??? ARIPiprazole (ABILIFY) 5 MG tablet Take 5 mg by mouth daily.     ??? atorvastatin (LIPITOR) 80 MG tablet Take 1 tablet (80 mg total) by mouth every evening. 90 tablet 1   ??? blood sugar diagnostic (ACCU-CHEK GUIDE) Strp by Other route Four (4) times a day (before meals and nightly). Acuu-chek Guide. 120 each 5   ??? busPIRone (BUSPAR) 10 MG tablet Take 10 mg by mouth Two (2) times a day.      ??? calcium-vitamin D 250-100 mg-unit per tablet Take 1 tablet by mouth Two (2) times a day. 60 tablet 11   ??? diclofenac sodium (VOLTAREN) 1 % gel Apply 2 g topically nightly.     ??? empty container Misc Use as directed 1 each PRN   ??? FLUoxetine (PROZAC) 20 MG capsule Take 20 mg by mouth daily.      ??? folic acid (FOLVITE) 1 MG tablet Take 1 tablet (1 mg total) by mouth daily. 90 tablet 3   ??? insulin glargine (LANTUS U-100 INSULIN) 100 unit/mL injection Inject 0.65 mL (65 Units total) under the skin daily. 19.5 mL 0   ??? insulin lispro (HUMALOG U-100 INSULIN) 100 unit/mL injection INJECT 0.20ML (20 UNITS TOTAL) UNDER THE SKIN THREE TIMES A DAY BEFORE MEALS. 50 mL 1   ??? insulin syringes, disposable, 1 mL Syrg 100 Units by Miscellaneous route daily. Insulin injecting diabetes 250.00  Inject daily  100 unit syringe (1ml) 100 each 3   ??? levothyroxine (SYNTHROID) 88 MCG tablet TAKE 1 TABLET BY MOUTH EVERY DAY 90 tablet 1   ??? lisinopriL (PRINIVIL,ZESTRIL) 2.5 MG tablet Take 1 tablet (2.5 mg total) by mouth daily. 90 tablet 3   ??? LYRICA 150 mg capsule Take 150 mg by mouth Three (3) times a day.  0   ??? melatonin 3 mg Tab Take 1 tablet (3 mg total) by mouth nightly as needed. (Patient not taking: Reported on 11/17/2018)  0   ??? metFORMIN (GLUCOPHAGE) 500 MG tablet Take 2 tablets (1,000 mg total) by mouth 2 (two) times a day with meals. 360 tablet 3   ??? methotrexate 25 mg/mL injection solution Inject 1 mL (25 mg total) under the skin once a week. 10 mL 3   ??? nystatin (MYCOSTATIN) 100,000 unit/gram powder Apply 1 application topically 4 (four) times a day as needed.     ??? polyethylene glycol (MIRALAX) 17 gram packet Take 17 g by mouth daily. (Patient not taking: Reported on 11/07/2018) 30 packet 0   ??? predniSONE (DELTASONE) 5 MG tablet Take 20 mg daily x 2 weeks, 15 mg daily x 2 weeks, 10 mg daily x 2 weeks, then 5 mg until visit with rheumatology 182 tablet 1   ??? promethazine (PHENERGAN) 25 MG tablet Take 25 mg by mouth every six (6) hours as needed for nausea. Indications: nausea and vomiting after surgery     ??? QUEtiapine (SEROQUEL) 300 MG tablet Take 300 mg by mouth nightly.     ???  senna (SENOKOT) 8.6 mg tablet Take 2 tablets by mouth nightly as needed for constipation. (Patient not taking: Reported on 11/07/2018) 60 tablet 0   ??? solifenacin (VESICARE) 5 MG tablet Take 1 tablet (5 mg total) by mouth daily. (Patient not taking: Reported on 11/17/2018) 30 tablet 12   ??? tocilizumab (ACTEMRA ACTPEN) 162 mg/0.9 mL PnIj Inject the contents of 1 pen (162 mg) under the skin every seven (7) days. 10.8 mL 3   ??? zolpidem (AMBIEN) 5 MG tablet Take 5 mg by mouth nightly as needed for sleep.       No current facility-administered medications for this visit.         Changes to medications: Vihana reports no changes at this time.    Allergies   Allergen Reactions   ??? Cephalexin Hives   ??? Doxycycline Rash   ??? Sulfa (Sulfonamide Antibiotics) Hives   ??? Codeine Nausea And Vomiting   ??? Darvocet A500  [Propoxyphene N-Acetaminophen] Nausea Only   ??? Hydrocodone Nausea And Vomiting   ??? Meropenem Rash     Erythematous, hot, pruritic rash over arms, chest, back, abdomen, and face occurred at the end of meropenem infusion on 02/22/18   ??? Sulfasalazine Palpitations     makes heart fly, she gets flushed and passes out       Changes to allergies: No    SPECIALTY MEDICATION ADHERENCE     Actemra 162mg /0.51ml: 7 days of medicine on hand     Medication Adherence    Patient reported X missed doses in the last month: 1  Specialty Medication: Actemra 162mg /0.41ml          Specialty medication(s) dose(s) confirmed: Regimen is correct and unchanged.     Are there any concerns with adherence? No    Adherence counseling provided? Not needed    CLINICAL MANAGEMENT AND INTERVENTION      Clinical Benefit Assessment:    Do you feel the medicine is effective or helping your condition? Yes    Clinical Benefit counseling provided? Not needed    Adverse Effects Assessment:    Are you experiencing any side effects? No    Are you experiencing difficulty administering your medicine? No    Quality of Life Assessment:    How many days over the past month did your rheumatoid arthritis  keep you from your normal activities? For example, brushing your teeth or getting up in the morning. 0    Have you discussed this with your provider? Not needed    Therapy Appropriateness:    Is therapy appropriate? Yes, therapy is appropriate and should be continued    DISEASE/MEDICATION-SPECIFIC INFORMATION      For patients on injectable medications: Patient currently has 1 doses left.  Next injection is scheduled for 12/26/2018.    PATIENT SPECIFIC NEEDS     ? Does the patient have any physical, cognitive, or cultural barriers? No    ? Is the patient high risk? No     ? Does the patient require a Care Management Plan? No     ? Does the patient require physician intervention or other additional services (i.e. nutrition, smoking cessation, social work)? No      SHIPPING     Specialty Medication(s) to be Shipped:   Inflammatory Disorders: Actemra 162mg /0.26ml    Other medication(s) to be shipped: none       Changes to insurance: No    Delivery Scheduled: Yes, Expected medication delivery date: 12/29/2018.  Medication will be delivered via Same Day Courier to the confirmed home address in [facility].    The patient will receive a drug information handout for each medication shipped and additional FDA Medication Guides as required.  Verified that patient has previously received a Conservation officer, historic buildings.    All of the patient's questions and concerns have been addressed.    [name] Corry Ihnen   [facility] Specialty Pharmacist

## 2018-12-29 MED FILL — ACTEMRA ACTPEN 162 MG/0.9 ML SUBCUTANEOUS PEN INJECTOR: 28 days supply | Qty: 4 | Fill #1 | Status: AC

## 2018-12-29 MED FILL — ACTEMRA ACTPEN 162 MG/0.9 ML SUBCUTANEOUS PEN INJECTOR: SUBCUTANEOUS | 28 days supply | Qty: 3.6 | Fill #1

## 2019-01-17 ENCOUNTER — Telehealth: Admit: 2019-01-17 | Discharge: 2019-01-18 | Payer: MEDICAID | Attending: Internal Medicine | Primary: Internal Medicine

## 2019-01-17 DIAGNOSIS — Z794 Long term (current) use of insulin: Secondary | ICD-10-CM

## 2019-01-17 DIAGNOSIS — E11649 Type 2 diabetes mellitus with hypoglycemia without coma: Secondary | ICD-10-CM

## 2019-01-17 DIAGNOSIS — E1121 Type 2 diabetes mellitus with diabetic nephropathy: Secondary | ICD-10-CM

## 2019-01-17 DIAGNOSIS — E1165 Type 2 diabetes mellitus with hyperglycemia: Secondary | ICD-10-CM

## 2019-01-17 DIAGNOSIS — Z89429 Acquired absence of other toe(s), unspecified side: Secondary | ICD-10-CM

## 2019-01-17 DIAGNOSIS — E11618 Type 2 diabetes mellitus with other diabetic arthropathy: Secondary | ICD-10-CM

## 2019-01-17 DIAGNOSIS — Z6841 Body Mass Index (BMI) 40.0 and over, adult: Secondary | ICD-10-CM

## 2019-01-17 DIAGNOSIS — E11319 Type 2 diabetes mellitus with unspecified diabetic retinopathy without macular edema: Secondary | ICD-10-CM

## 2019-01-17 MED ORDER — TRESIBA FLEXTOUCH U-200 INSULIN 200 UNIT/ML (3 ML) SUBCUTANEOUS PEN
4 refills | 0 days | Status: CP
Start: 2019-01-17 — End: ?

## 2019-01-17 NOTE — Unmapped (Signed)
Endocrine Virtual Care Visit    I spent 22 minutes on the phone with the patient. I spent an additional 12 minutes on pre- and post-visit activities.     The patient was physically located in [place] or a state in which I am permitted to provide care. The patient understood that s/he may incur co-pays and cost sharing, and agreed to the telemedicine visit. The visit was completed via phone and/or video, which was appropriate and reasonable under the circumstances given the patient's presentation at the time. The patient has been advised of the potential risks and limitations of this mode of treatment (including, but not limited to, the absence of in-person examination) and has agreed to be treated using telemedicine. The patient's/patient's family's questions regarding telemedicine have been answered. If the phone/video visit was completed in an ambulatory setting, the patient has also been advised to contact their provider???s office for worsening conditions, and seek emergency medical treatment and/or call 911 if the patient deems either necessary.     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 Goal HBA1c is <7 with minimal hypoglycemia. She is in the contemplative stage of behavior change for BG management. She would like to set a goal of better monitoring today with a goal of remembering at least her basal insulin. She would also benefit from a GLP1a which we briefly discussed and will pursue at in-person followup.   -she will document BG log for 2 weeks with 3-4x daily BG readings  -CDE followup to discuss CGM in 2-3 weeks  -change lantus to tresiba at same dose, will start titrating up basal as this is her most reliable dose of insulin and AM FBG is >200mg /dL so little risk of hypoglycemia  -F/u with me in 1 month with plans to discuss GLP1ra which will help glycemic control and weight (she has endogenous insulin production).    #Diabetes Complications:  A. Retinopthy - last in 2018, due this year  B. Nephropathy - due for Ma/Cr  C. Neuropathy - s/p toe amputation  D. Foot - as above  E. Lipids - on high intensity statin, LDLC above goal and calculated level  F. Blood pressure - outpatient level needed    Follow-up in about 3 months.     The patient was seen and discussed with Dr. Almond Lint who was available.    --  Glean Hess, MD  Endocrinology Fellow [facility] at [facility]  Phone: [phone]   Fax: [phone]    ----------------------------------------------------------------------------------------  History of Presenting Illness:  Amanda Hanson is a 58 y.o. female who is seen in consultation for Diabetes at the request of Loran Senters, *.    Current regimen includes:  Lantus 65 units nightly  Humalog 20 units TIDAC  Standard Correction    BG in AM: 200mg /dL, rarely checks at other times. Expresses frustration with fingerstick BG checks which leads to non-adherence to insulin administration. Reports missing both basal and nutritional insulin with some frequency. Basal insulin is the dose she most reliably takes.    No lows at home - BG typically over 300. She frequently forgets daily injections, especially mealtime insulin.    A 10 system ROS was completed and is negative other than that in HPI.    Diabetes History  Diabetes Mellitus Type 2 vs LADA (C-peptide 7, GAD ab 0.04)  Prior therapies: MF, Insulin  Current exercise: none  Nutrition: skips either breakfast or lunch  most days, dinner is largest meal and is varied  Ma/Cr:   Albumin/Creatinine Ratio   Date Value Ref Range Status   01/23/2018 12.2 0.0 - 30.0 ug/mg Final     A1c:   Lab Results   Component Value Date    A1C 13.4 (H) 12/14/2018    A1C 13.4 (H) 08/23/2018    A1C 11.4 (H) 06/15/2018    A1C 12.6 (H) 02/03/2018    A1C 10.6 (A) 02/02/2018      Eye Exam:  Foot Exam:     ACEi/ARB:   Lipid management:   Most recent lipids:   Lab Results   Component Value Date    CHOL 209 (H) 11/29/2016      Lab Results   Component Value Date    HDL 52 11/29/2016    No components found for: LDLCALC,  LDL,  LDLDIRECT   Lab Results   Component Value Date    TRIG 213 (H) 11/29/2016    No components found for: [facility]  Lab Results   Component Value Date    LDL 114 (H) 11/29/2016        Past Medical History:   Diagnosis Date   ??? Abnormal mammogram    ??? Anxiety disorder    ??? Arthritis    ??? Congestive heart failure (CHF) (CMS-HCC)    ??? DDD (degenerative disc disease)    ??? Diabetes mellitus type 2 in obese (CMS-HCC) 2011   ??? Hypothyroidism    ??? Mixed incontinence urge and stress (female)(female)    ??? Obesity    ??? Other functional disorder of bladder    ??? Rheumatoid arthritis(714.0)    ??? Sleep apnea    ??? Urinary frequency    ??? Urinary obstruction, not elsewhere classified       Past Surgical History:   Procedure Laterality Date   ??? BREAST BIOPSY Left 5 15 2017    with clip   ??? CHOLECYSTECTOMY     ??? Cystoscopy with Hydrodistention      11/2004   ??? HERNIA REPAIR     ??? HYSTERECTOMY  2000   ??? Interstim Placement      02/2005   ??? LAPAROSCOPIC GASTRIC BANDING     ??? OOPHORECTOMY Bilateral 2000   ??? PR AMPUTATION METATARSAL+TOE,SINGLE Right 08/24/2018    Procedure: PRIORITY 2nd ray amputation;  Surgeon: Karen Chafe, DPM;  [facility];  Service: Vascular   ??? PR AMPUTATION TOE,MT-P JT Right 02/23/2018    Procedure: AMPUTATION, TOE; METATARSOPHALANGEAL JOINT;  Surgeon: Karen Chafe, DPM;  Location: [facility];  Service: Vascular   ??? PR AMPUTATION TOE,MT-P JT Right 08/02/2018    Procedure: AMPUTATION, TOE; METATARSOPHALANGEAL JOINT;  Surgeon: Webb Silversmith, MD;  Location: MAIN OR [facility];  Service: Vascular   ??? PR DEBRIDEMENT, SKIN, SUB-Q TISSUE,=<20 SQ CM Right 11/15/2017    Procedure: Debridement of right groin/perineal wound;  Surgeon: Joanie Coddington, MD;  Location: MAIN OR [facility];  Service: Trauma   ??? PR DEBRIDEMENT, SKIN, SUB-Q TISSUE,MUSCLE,BONE,=<20 SQ CM Right 02/28/2018    Procedure: DEBRIDEMENT; SKIN, SUBCUTANEOUS TISSUE, MUSCLE, & BONE FOOT;  Surgeon: Karen Chafe, DPM;  Location: MA[facility];  Service: Vascular   ??? PR DEBRIDEMENT, SKIN, SUB-Q TISSUE,MUSCLE,BONE,=<20 SQ CM Right 03/28/2018    Procedure: DEBRIDEMENT; SKIN, SUBCUTANEOUS TISSUE, MUSCLE, & BONE FOOT;  Surgeon: Karen Chafe, DPM;  Location: MAIN OR [facility];  Service: Vascular   ??? PR FULL EXCIS 1ST METATARSAL HEAD  Right 03/28/2018    Procedure: OSTECTOMY COMPLT EXC; FIRST METATARSAL HEAD;  Surgeon: Karen Chafe, DPM;  Location: MAIN OR [facility];  Service: Vascular   ??? PR I&D OF VULVA/PERINEUM ABSCESS Left 12/13/2018    Procedure: Incision And Drainage Of Vulva Or Perineal Abscess;  Surgeon: Joanie Coddington, MD;  Location: MAIN OR [facility];  Service: Trauma   ??? PR I&D PERIANAL ABSCESS,SUPERFICIAL Left 12/13/2018    Procedure: INCISION AND DRAINAGE, PERIANAL ABSCESS, SUPERFICIAL;  Surgeon: Joanie Coddington, MD;  Location: MAIN OR [facility];  Service: Trauma   ??? PR INCIS/DRAIN THIGH/KNEE ABSCESS,DEEP Left 06/14/2018    Procedure: Deep thigh abscess drainage;  Surgeon: Suella Broad, MD;  Location: MAIN OR [facility];  Service: Trauma   ??? PR SECD CLOS SURG WND EXTEN/COMPLIC Right 02/28/2018    Procedure: SECONDARY CLOSURE OF SURGICAL WOUND OR DEHISCENCE, EXTENSIVE OR COMPLICATED;  Surgeon: Karen Chafe, DPM;  Location: MAIN OR [facility];  Service: Vascular        Current Outpatient Medications:   ???  acetaminophen (TYLENOL) 500 MG tablet, Take 500 mg by mouth Three (3) times a day as needed for pain., Disp: , Rfl:   ???  ARIPiprazole (ABILIFY) 5 MG tablet, Take 5 mg by mouth daily., Disp: , Rfl:   ???  atorvastatin (LIPITOR) 80 MG tablet, Take 1 tablet (80 mg total) by mouth every evening., Disp: 90 tablet, Rfl: 1  ???  blood sugar diagnostic (ACCU-CHEK GUIDE) Strp, by Other route Four (4) times a day (before meals and nightly). Acuu-chek Guide., Disp: 120 each, Rfl: 5  ???  busPIRone (BUSPAR) 10 MG tablet, Take 10 mg by mouth Two (2) times a day. , Disp: , Rfl:   ???  calcium-vitamin D 250-100 mg-unit per tablet, Take 1 tablet by mouth Two (2) times a day., Disp: 60 tablet, Rfl: 11  ???  diclofenac sodium (VOLTAREN) 1 % gel, Apply 2 g topically nightly., Disp: , Rfl:   ???  empty container Misc, Use as directed, Disp: 1 each, Rfl: PRN  ???  FLUoxetine (PROZAC) 20 MG capsule, Take 20 mg by mouth daily. , Disp: , Rfl:   ???  folic acid (FOLVITE) 1 MG tablet, Take 1 tablet (1 mg total) by mouth daily., Disp: 90 tablet, Rfl: 3  ???  insulin glargine (LANTUS U-100 INSULIN) 100 unit/mL injection, Inject 0.65 mL (65 Units total) under the skin daily., Disp: 19.5 mL, Rfl: 0  ???  insulin lispro (HUMALOG U-100 INSULIN) 100 unit/mL injection, INJECT 0.20ML (20 UNITS TOTAL) UNDER THE SKIN THREE TIMES A DAY BEFORE MEALS., Disp: 50 mL, Rfl: 1  ???  insulin syringes, disposable, 1 mL Syrg, 100 Units by Miscellaneous route daily. Insulin injecting diabetes 250.00 Inject daily 100 unit syringe (1ml), Disp: 100 each, Rfl: 3  ???  levothyroxine (SYNTHROID) 88 MCG tablet, TAKE 1 TABLET BY MOUTH EVERY DAY, Disp: 90 tablet, Rfl: 1  ???  lisinopriL (PRINIVIL,ZESTRIL) 2.5 MG tablet, Take 1 tablet (2.5 mg total) by mouth daily., Disp: 90 tablet, Rfl: 3  ???  LYRICA 150 mg capsule, Take 150 mg by mouth Three (3) times a day., Disp: , Rfl: 0  ???  metFORMIN (GLUCOPHAGE) 500 MG tablet, Take 2 tablets (1,000 mg total) by mouth 2 (two) times a day with meals., Disp: 360 tablet, Rfl: 3  ???  methotrexate 25 mg/mL injection solution, Inject 1 mL (25 mg total) under the skin once a week., Disp: 10 mL, Rfl: 3  ???  nystatin (  MYCOSTATIN) 100,000 unit/gram powder, Apply 1 application topically 4 (four) times a day as needed., Disp: , Rfl:   ???  predniSONE (DELTASONE) 5 MG tablet, Take 20 mg daily x 2 weeks, 15 mg daily x 2 weeks, 10 mg daily x 2 weeks, then 5 mg until visit with rheumatology, Disp: 182 tablet, Rfl: 1  ???  promethazine (PHENERGAN) 25 MG tablet, Take 25 mg by mouth every six (6) hours as needed for nausea. Indications: nausea and vomiting after surgery, Disp: , Rfl:   ???  QUEtiapine (SEROQUEL) 300 MG tablet, Take 300 mg by mouth nightly., Disp: , Rfl:   ???  senna (SENOKOT) 8.6 mg tablet, Take 2 tablets by mouth nightly as needed for constipation., Disp: 60 tablet, Rfl: 0  ???  solifenacin (VESICARE) 5 MG tablet, Take 1 tablet (5 mg total) by mouth daily., Disp: 30 tablet, Rfl: 12  ???  tocilizumab (ACTEMRA ACTPEN) 162 mg/0.9 mL PnIj, Inject the contents of 1 pen (162 mg) under the skin every seven (7) days., Disp: 10.8 mL, Rfl: 3  ???  zolpidem (AMBIEN) 5 MG tablet, Take 5 mg by mouth nightly as needed for sleep., Disp: , Rfl:       Family History   Problem Relation Age of Onset   ??? Cancer Maternal Grandmother         Lung cancer   ??? Diabetes Maternal Grandmother    ??? Rheum arthritis Sister    ??? GU problems Neg Hx    ??? Kidney cancer Neg Hx    ??? Prostate cancer Neg Hx

## 2019-01-17 NOTE — Unmapped (Signed)
Spoke with pt over the phone. Went over allergies and medications.sent mychart information for [facility].

## 2019-01-19 ENCOUNTER — Ambulatory Visit
Admit: 2019-01-19 | Discharge: 2019-01-20 | Payer: MEDICAID | Attending: Nurse Practitioner | Primary: Nurse Practitioner

## 2019-01-19 DIAGNOSIS — Z5189 Encounter for other specified aftercare: Secondary | ICD-10-CM

## 2019-01-19 NOTE — Unmapped (Signed)
[facility] Refill Coordination Note    Specialty Medication(s) to be Shipped:   Inflammatory Disorders: Actemra    Other medication(s) to be shipped: n/a     Amanda Hanson, DOB: [birth year]  Phone: [phone] (home)       All above HIPAA information was verified with patient.     Completed refill call assessment today to schedule patient's medication shipment from the [facility] [phone]).       Specialty medication(s) and dose(s) confirmed: Regimen is correct and unchanged.   Changes to medications: Amanda Hanson reports no changes at this time.  Changes to insurance: No  Questions for the pharmacist: No    Confirmed patient received Welcome Packet with first shipment. The patient will receive a drug information handout for each medication shipped and additional FDA Medication Guides as required.       DISEASE/MEDICATION-SPECIFIC INFORMATION        For patients on injectable medications: Patient currently has 2 doses left.  Next injection is scheduled for 01/26/19.    SPECIALTY MEDICATION ADHERENCE     Medication Adherence    Patient reported X missed doses in the last month: 0  Specialty Medication: actemra  Patient is on additional specialty medications: No  Patient is on more than two specialty medications: No  Any gaps in refill history greater than 2 weeks in the last 3 months: no  Demonstrates understanding of importance of adherence: yes  Informant: patient                Actemra 162mg /0.1ml: Patient has 14 days of medication on hand      SHIPPING     Shipping address confirmed in Epic.     Delivery Scheduled: Yes, Expected medication delivery date: 01/26/19.     Medication will be delivered via Same Day Courier to the home address in Epic WAM.    Amanda Hanson   [facility] Specialty Technician

## 2019-01-19 NOTE — Unmapped (Signed)
Reason for Visit: wound check    HPI: Amanda Hanson is a 58 y.o. female with PMH of arthritis, CHF, T2DM, and recurrent diabetic foot ulcers s/p multiple amputations who was recently hospitalization 8/25-8/31 with perirectal abscess s/p I&D of perianal and labial abscess on 8/26. Operative findings were Gluteal/perianal abscess with large cavity; penrose drain placed. Labial abscess with small cavity; packing placed. (Vanc (8/26-8/28) and Zosyn (8/26- 8/29). Cultures grew with mixed gram neg and positive and she was transitioned to PO cipro and flaygl for an additional 10 day course of antibiotics. Patient was recently seen in endocrinology clinic 9/30 for diabetes management.   ??  Patient returns to clinic today with no new concerns or complaints. Reports BG runs in the 300s but that she is working on her diet and is now being followed by an endocrinologist. Drainage from her wound is light brown and scant. Pain has improved significantly. Reports overall feeling very well, is interested in having drain removed today.     Patient denies any fever, pain, or incisional redness. Reports eating and drinking fine and denies any nausea, vomiting, diarrhea or constipation.    Reivew of Systems:  Negative except otherwise noted in the HPI.    Physical Exam:  Vitals:    01/19/19 0839   BP: 100/68   Pulse: 78   Resp: 18   Temp: 36.9 ??C (98.4 ??F)   SpO2: 95%       General: awake, alert, afebrile, in NAD  Neuro: Alert and oriented ??3  Lungs: Normal work of breathing on room air  Abdomen: soft, non tender, non distended.   Buttocks: Completed non tender, Wound is clean and dry, incisions x2 with granulation tissue, no erythema, edema or ecchymosis noted. No induration or fluctuance. Scant SS drainage noted. Penrose intact (removed today).   Labia: Wound is healed, c/d/i, no drainage, no surrounding redness, non tender.       Assessment and Plan: Amanda Hanson is a 58 y.o. female ho was recently hospitalization 8/25-8/31 with perirectal abscess s/p I&D of perianal and labial abscess on 8/26. Completed antibiotic course with cipro/flagyl.     On exam no signs or symptoms of infection is noted and patient is healing well. Labia wound is healed, buttocks wounds (x2 incisions) with penrose drain. Buttocks wound is healing very well also, penrose drain removed. No need for packing. Given location and patient habitus unlikely to be able to perform packing regardless. Recommended sitz baths throughout day and showering at least daily. Continue to monitor for signs of infection, discussed return precautions.  Discussed with patient importance of blood glucose control to aid in wound healing. Patient seems motivated to focus on diabetes moving forward with her new endocrinologist.     May take tylenol as needed for pain (1000 mg every 8 hours). However patient at this time, reporting no pain.     Discussed with the patient at this time, does not need to follow-up with the general surgery clinic only on an as-needed basis.  If any new questions or concerns should arise to give the clinic a call, otherwise continue follow-up with their primary care physician.  Patient was agreeable to plan.

## 2019-01-19 NOTE — Unmapped (Signed)
You were seen in clinic today for your buttocks wound check. You are healing well and no signs of infection are present. The drain was removed. Continue sitz baths and showers to keep the area clean and dry. You do not have to pack your wound but continue to monitor for signs of infection including increased pain, new fevers, redness or thick, pus like drainage. Monitor your blood sugars closely and take your medication as directed by your endocrinologist.

## 2019-01-22 ENCOUNTER — Telehealth: Payer: Self-pay | Admitting: Pain Medicine

## 2019-01-22 NOTE — Telephone Encounter (Signed)
I spoke with Ms. Jamroz, she has enough Lyrica to last until next appointment.

## 2019-01-22 NOTE — Telephone Encounter (Signed)
[name] [name] lvmail asking to verify a refill on Lyrica for [name] [name]. 03-20-2061.  Phone [phone]
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
## 2019-01-26 MED FILL — ACTEMRA ACTPEN 162 MG/0.9 ML SUBCUTANEOUS PEN INJECTOR: SUBCUTANEOUS | 28 days supply | Qty: 3.6 | Fill #2

## 2019-01-26 MED FILL — ACTEMRA ACTPEN 162 MG/0.9 ML SUBCUTANEOUS PEN INJECTOR: 28 days supply | Qty: 4 | Fill #2 | Status: AC

## 2019-01-26 NOTE — Unmapped (Addendum)
Called patient to clarify dose. She should start 65 units daily. If BG is above 150mg /dL for 3 days, increase by 2 units.     She should call clinic if she gets up to the point of using 80 units daily to notify me of the status.     All questions addressed.    --  Glean Hess, MD  Endocrinology Fellow [facility] Endocrinology at [facility]  Phone: [phone]   Fax: [phone]          ----- Message from Patrecia Pace sent at 01/26/2019  5:33 AM EDT -----  Regarding: message to clarify dose  Call patient regarding tresiba dosing

## 2019-01-29 ENCOUNTER — Other Ambulatory Visit: Payer: Self-pay | Admitting: Psychiatry

## 2019-02-01 ENCOUNTER — Ambulatory Visit: Admit: 2019-02-01 | Discharge: 2019-02-02 | Payer: MEDICAID | Attending: Family | Primary: Family

## 2019-02-01 DIAGNOSIS — G2581 Restless legs syndrome: Principal | ICD-10-CM

## 2019-02-01 DIAGNOSIS — E1165 Type 2 diabetes mellitus with hyperglycemia: Principal | ICD-10-CM

## 2019-02-01 DIAGNOSIS — Z794 Long term (current) use of insulin: Principal | ICD-10-CM

## 2019-02-01 DIAGNOSIS — R252 Cramp and spasm: Principal | ICD-10-CM

## 2019-02-01 DIAGNOSIS — Z23 Encounter for immunization: Principal | ICD-10-CM

## 2019-02-01 MED ORDER — PRAMIPEXOLE 0.125 MG TABLET: 0 mg | tablet | Freq: Every evening | 1 refills | 60 days | Status: AC

## 2019-02-01 MED ORDER — QUETIAPINE 300 MG TABLET: 300 mg | tablet | Freq: Every evening | 3 refills | 90 days | Status: AC

## 2019-02-03 ENCOUNTER — Other Ambulatory Visit: Payer: Self-pay

## 2019-02-03 ENCOUNTER — Encounter: Payer: Self-pay | Admitting: Emergency Medicine

## 2019-02-03 ENCOUNTER — Emergency Department
Admission: EM | Admit: 2019-02-03 | Discharge: 2019-02-03 | Disposition: A | Discharge status: Home / Self Care | Payer: Medicaid Other | Attending: Student | Admitting: Student

## 2019-02-03 DIAGNOSIS — L02211 Cutaneous abscess of abdominal wall: Secondary | ICD-10-CM | POA: Diagnosis present

## 2019-02-03 DIAGNOSIS — Z79899 Other long term (current) drug therapy: Secondary | ICD-10-CM | POA: Diagnosis not present

## 2019-02-03 DIAGNOSIS — E119 Type 2 diabetes mellitus without complications: Secondary | ICD-10-CM | POA: Insufficient documentation

## 2019-02-03 DIAGNOSIS — Z794 Long term (current) use of insulin: Secondary | ICD-10-CM | POA: Insufficient documentation

## 2019-02-03 DIAGNOSIS — I5032 Chronic diastolic (congestive) heart failure: Secondary | ICD-10-CM | POA: Insufficient documentation

## 2019-02-03 DIAGNOSIS — I110 Hypertensive heart disease with heart failure: Secondary | ICD-10-CM | POA: Diagnosis not present

## 2019-02-03 DIAGNOSIS — Y813 Surgical instruments, materials and general- and plastic-surgery devices (including sutures) associated with adverse incidents: Secondary | ICD-10-CM | POA: Diagnosis not present

## 2019-02-03 DIAGNOSIS — Z87891 Personal history of nicotine dependence: Secondary | ICD-10-CM | POA: Diagnosis not present

## 2019-02-03 DIAGNOSIS — E039 Hypothyroidism, unspecified: Secondary | ICD-10-CM | POA: Insufficient documentation

## 2019-02-03 DIAGNOSIS — T8090XA Unspecified complication following infusion and therapeutic injection, initial encounter: Secondary | ICD-10-CM | POA: Diagnosis not present

## 2019-02-03 LAB — CBC W/ AUTO DIFF
BASOPHILS ABSOLUTE COUNT: 0.1 10*9/L (ref 0.0–0.1)
BASOPHILS RELATIVE PERCENT: 0.8 %
EOSINOPHILS ABSOLUTE COUNT: 0 10*9/L (ref 0.0–0.4)
EOSINOPHILS RELATIVE PERCENT: 0.3 %
HEMATOCRIT: 42.7 % (ref 36.0–46.0)
HEMOGLOBIN: 13.7 g/dL (ref 12.0–16.0)
LARGE UNSTAINED CELLS: 2 % (ref 0–4)
LYMPHOCYTES ABSOLUTE COUNT: 1.8 10*9/L (ref 1.5–5.0)
LYMPHOCYTES RELATIVE PERCENT: 26.9 %
MEAN CORPUSCULAR HEMOGLOBIN CONC: 32.1 g/dL (ref 31.0–37.0)
MEAN CORPUSCULAR HEMOGLOBIN: 26.7 pg (ref 26.0–34.0)
MEAN PLATELET VOLUME: 8.3 fL (ref 7.0–10.0)
MONOCYTES ABSOLUTE COUNT: 0.4 10*9/L (ref 0.2–0.8)
MONOCYTES RELATIVE PERCENT: 5.4 %
NEUTROPHILS ABSOLUTE COUNT: 4.3 10*9/L (ref 2.0–7.5)
PLATELET COUNT: 221 10*9/L (ref 150–440)
RED BLOOD CELL COUNT: 5.13 10*12/L (ref 4.00–5.20)
RED CELL DISTRIBUTION WIDTH: 16.9 % — ABNORMAL HIGH (ref 12.0–15.0)
WBC ADJUSTED: 6.6 10*9/L (ref 4.5–11.0)

## 2019-02-03 LAB — C-REACTIVE PROTEIN: C reactive protein:MCnc:Pt:Ser/Plas:Qn:: <5

## 2019-02-03 LAB — COMPREHENSIVE METABOLIC PANEL
ALBUMIN: 4.6 g/dL (ref 3.5–5.0)
ALKALINE PHOSPHATASE: 66 U/L (ref 38–126)
ALT (SGPT): 19 U/L (ref <35)
ANION GAP: 12 mmol/L (ref 7–15)
AST (SGOT): 26 U/L (ref 14–38)
BILIRUBIN TOTAL: 1 mg/dL (ref 0.0–1.2)
BLOOD UREA NITROGEN: 12 mg/dL (ref 7–21)
BUN / CREAT RATIO: 16
CALCIUM: 9.8 mg/dL (ref 8.5–10.2)
CHLORIDE: 103 mmol/L (ref 98–107)
CO2: 23 mmol/L (ref 22.0–30.0)
CREATININE: 0.75 mg/dL (ref 0.60–1.00)
EGFR CKD-EPI AA FEMALE: >90 mL/min/{1.73_m2} (ref >=60)
EGFR CKD-EPI NON-AA FEMALE: 89 mL/min/{1.73_m2} (ref >=60)
GLUCOSE RANDOM: 231 mg/dL — ABNORMAL HIGH (ref 70–179)
POTASSIUM: 3.8 mmol/L (ref 3.5–5.0)

## 2019-02-03 LAB — PROTEIN TOTAL: Protein:MCnc:Pt:Ser/Plas:Qn:: 7.9

## 2019-02-03 LAB — LACTATE BLOOD VENOUS: Lactate:SCnc:Pt:BldV:Qn:: 2.1 — ABNORMAL HIGH

## 2019-02-03 LAB — MAGNESIUM: Magnesium:MCnc:Pt:Ser/Plas:Qn:: 1.1 — ABNORMAL LOW

## 2019-02-03 LAB — EOSINOPHILS ABSOLUTE COUNT: Eosinophils:NCnc:Pt:Bld:Qn:Automated count: 0

## 2019-02-03 LAB — LIPASE: Triacylglycerol lipase:CCnc:Pt:Ser/Plas:Qn:: 226

## 2019-02-03 MED ORDER — PROMETHAZINE HCL 25 MG PO TABS
25.0000 mg | ORAL_TABLET | Freq: Once | ORAL | Status: AC
  Administered 2019-02-03: 25 mg via ORAL
  Filled 2019-02-03: qty 1

## 2019-02-03 MED ORDER — HYDROMORPHONE HCL 1 MG/ML IJ SOLN
1.0000 mg | Freq: Once | INTRAMUSCULAR | Status: AC
  Administered 2019-02-03: 1 mg via INTRAMUSCULAR
  Filled 2019-02-03: qty 1

## 2019-02-03 NOTE — ED Notes (Signed)
See triage note   Presents with possible abscess area to abd  States she was given Lovenox shot after having surgery at [facility]  Now area is red and draining

## 2019-02-03 NOTE — ED Triage Notes (Signed)
Abscess L lower abdomen x 3 days. States thinks it is infected injection site. Was taking Lovenox injections while in hospital.

## 2019-02-03 NOTE — ED Provider Notes (Signed)
[facility] Emergency Department Provider Note  ____________________________________________  Time seen: Approximately 12:44 PM  I have reviewed the triage vital signs and the nursing notes.   HISTORY  Chief Complaint Abscess   HPI [name] [name] is a 58 y.o. female with significant past medical history of diabetes presents to the emergency department for treatment and evaluation of an area on her abdomen she is concerned is an abscess.  She was hospitalized in August after incision and drainage of cyst in the vulvovaginal area and received Lovenox.  She feels that the area of concern today is related to the Lovenox injections.  She has noticed some foul-smelling drainage over the past few days.  She denies fever but complains of nausea.  She states that she has taken some Phenergan without relief.  She states that Zofran does not help with her nausea.  Past Medical History:  Diagnosis Date  . Abdominal wall hernia 01/29/2013  . Anxiety   . Arthritis    Rheumatoid  . C. difficile colitis   . Chronic diastolic heart failure (HCC)   . Depression   . Diabetes mellitus    states no meds or diet restrictions  at present  . Diastolic CHF (HCC)   . Esophagitis   . Fluid retention   . GERD (gastroesophageal reflux disease)   . Hiatal hernia   . Hypertension   . Hypokalemia due to loss of potassium 10/21/2015   Overview:  Associated with 3 weeks of diarrhea  And QT prolongation.  . Hypothyroidism   . IBS (irritable bowel syndrome)   . Moderate episode of recurrent major depressive disorder (HCC) 06/03/2004  . Morbid obesity (HCC)   . MRSA (methicillin resistant Staphylococcus aureus) infection 11/2017   left inner thigh abcess  . Neurogenic bladder    has pacemaker  . Neuropathy   . Obesity   . Panic attacks   . Rheumatoid arthritis (HCC)   . Sleep apnea    STATES SEVERE, CANT TOLERATE MASK- LAST STUDY YEARS AGO    Patient Active Problem List   Diagnosis Date Noted  . Elevated C-reactive protein (CRP) 12/15/2018  . Elevated sed rate 12/15/2018  . Pharmacologic therapy 11/06/2018  . Disorder of skeletal system 11/06/2018  . Problems influencing health status 11/06/2018  . Decubitus ulcer of heel, bilateral 02/21/2018  . Diabetic ulcer of toe of right foot associated with type 2 diabetes mellitus (HCC) 02/14/2018  . Ulcer of left heel and midfoot with fat layer exposed (HCC) 02/14/2018  . MRSA (methicillin resistant Staphylococcus aureus) infection 11/17/2017  . Cellulitis 11/15/2017  . Perineal abscess 11/15/2017  . Subacute vulvitis 11/01/2017  . Chronic cystitis 03/01/2017  . Chronic pain syndrome 03/31/2016  . Insomnia secondary to chronic pain 03/31/2016  . Chronic upper back pain 12/25/2015  . Chronic hand pain (Bilateral) (L>R) 12/25/2015  . Rheumatoid arthritis (HCC) 12/25/2015  . Osteoarthritis, multiple sites 12/24/2015  . Chronic foot pain (Primary Area of Pain) (Bilateral) (L>R) 12/24/2015  . Chronic elbow pain (Third area of Pain) (Bilateral) (L>R) 12/24/2015  . Chronic shoulder pain (Bilateral) (L>R) 12/24/2015  . Chronic neck pain (Bilateral) (R>L) 12/24/2015  . 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[employer]) 12/23/2015  . Chronic knee pain (Bilateral) (R>L) 12/23/2015  . Long term current use of opiate analgesic 12/23/2015  . Long term prescription opiate use 12/23/2015  . Opiate use (30 MME/Day) 12/23/2015  . Neurogenic pain 12/23/2015  . Neuropathic pain 12/23/2015  . Diabetic peripheral neuropathy (HCC) 12/23/2015  .  Encounter for therapeutic drug level monitoring 12/23/2015  . Encounter for pain management planning 12/23/2015  . GERD (gastroesophageal reflux disease) 11/25/2015  . Neuropathy 11/25/2015  . Hypokalemia 10/21/2015  . Hypomagnesemia 10/21/2015  . QT prolongation 10/21/2015  . 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 Chronic wrist pain (Secondary  area of Pain) (Bilateral) (L>R) 03/19/2015  . 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 Female genuine stress incontinence 02/14/2015  . Urge incontinence of urine 02/14/2015  . Obstructive apnea 07/03/2014  . Chronic diastolic CHF (congestive heart failure), NYHA class 3 (HCC) 07/03/2014  . Anxiety 01/03/2014  . Diabetic ulcer of heel (HCC) 01/03/2014  . COPD (chronic obstructive pulmonary disease) (HCC) 01/03/2014  . Bipolar disorder, unspecified (HCC) 01/03/2014  . Diastolic dysfunction 01/03/2014  . Combined fat and carbohydrate induced hyperlipemia 01/03/2014  . Shortness of breath 01/03/2014  . Acute on chronic congestive heart failure (HCC) 01/03/2014  . Incomplete bladder emptying 11/02/2012  . Bladder retention 10/10/2012  . Detrusor muscle hypertonia 10/04/2012  . Obstruction of urinary tract 10/04/2012  . FOM (frequency of micturition) 10/04/2012  . Mixed incontinence 10/04/2012  . Vitamin D insufficiency 04/15/2012  . Borderline personality disorder (HCC) 01/06/2012  . Mixed anxiety depressive disorder 01/06/2012  . History of laparoscopic adjustable gastric banding, 03/20/2007.  Removed 09/19/2011. 08/04/2011  . Hypothyroidism 06/28/2010  . Rheumatoid arthritis flare (HCC) 06/28/2010  . Obesity 03/29/2005  . Essential (primary) hypertension 01/27/2005  . Major depressive disorder, recurrent episode, moderate (HCC) 06/03/2004    Past Surgical History:  Procedure Laterality Date  . ABDOMINAL HYSTERECTOMY    . CHOLECYSTECTOMY    . DG GREAT TOE RIGHT FOOT  02/23/2018  . EYE SURGERY     bilateral cataract extraction with IOL  . HERNIA REPAIR     ventral hernia with strangulation  . LAPAROSCOPIC GASTRIC BANDING  03/20/07  . TONSILLECTOMY    . TUBAL LIGATION      Prior to Admission medications   Medication Sig Start Date End Date Taking? Authorizing Provider  ARIPiprazole (ABILIFY) 5 MG tablet Take 1 tablet (5 mg total) by mouth daily. 11/30/18   Clapacs, Jackquline DenmarkJohn T, MD   atorvastatin (LIPITOR) 80 MG tablet Take by mouth. 02/09/13   [provider]  B-D INSULIN SYRINGE 1CC/25G 25G X 5/8" 1 ML MISC 1 UNITS BY MISCELLANEOUS ROUTE EVERY SEVEN (7) DAYS. TO BE USED WITH METHOTREXATE 12/10/16   [provider]  busPIRone (BUSPAR) 10 MG tablet Take 1 tablet (10 mg total) by mouth 2 (two) times daily. 11/30/18   Clapacs, Jackquline DenmarkJohn T, MD  Calcium Carb-Ergocalciferol 250-125 MG-UNIT TABS Take by mouth.    [provider]  cephALEXin (KEFLEX) 500 MG capsule Take 1 capsule (500 mg total) by mouth 2 (two) times daily. 06/27/18   Tommie Samsook, Jayce G, DO  Cholecalciferol (VITAMIN D3) 125 MCG (5000 UT) CAPS Take 1 capsule (5,000 Units total) by mouth daily with breakfast. Take along with calcium and magnesium. 12/15/18 06/13/19  Delano MetzNaveira, Francisco, MD  clotrimazole-betamethasone (LOTRISONE) cream Apply 1 application topically 2 (two) times daily. For yeast infection under stomach 05/07/11   [provider]  Diclofenac Sodium 3 % GEL APPLY 1 APPLICATION TOPICALLY TWO (2) TIMES A DAY. 06/21/17   [provider]  FLUoxetine (PROZAC) 20 MG capsule TAKE (1) CAPSULE BY MOUTH ONCE DAILY. 11/30/18   Clapacs, Jackquline DenmarkJohn T, MD  folic acid (FOLVITE) 1 MG tablet Take 1 mg by mouth daily.     [provider]  hydroxychloroquine (PLAQUENIL) 200 MG tablet Take 200 mg by mouth 2 (two) times daily.    [provider]  hydrOXYzine (ATARAX/VISTARIL) 50 MG tablet Take 50 mg by mouth Twice daily.  07/07/11   [provider]  insulin lispro (HUMALOG) 100 UNIT/ML injection Inject 10u AC TID and add SSI: <150=0u, 151-200=2u, 201-250=4u, 251-300=6u, >300mg /dl=8u. Up to 60u daily. 10/31/17   [provider]  KLOR-CON M20 20 MEQ tablet TAKE ONE TABLET BY MOUTH TWICE DAILY Patient taking differently: take 2 tabs in the a.m. , 1 tab in the evening 04/10/13   Wellington Hampshire, MD  LANTUS 100 UNIT/ML injection INJECT 0.2 ML (20 UNITS TOTAL) UNDER THE SKIN  NIGHTLY. 10/02/17   [provider]  leflunomide (ARAVA) 10 MG tablet Take 10 mg by mouth daily. 04/27/17   [provider]  levothyroxine (SYNTHROID, LEVOTHROID) 88 MCG tablet Take 88 mcg by mouth daily before breakfast.    [provider]  lisinopril (PRINIVIL,ZESTRIL) 2.5 MG tablet Take 2.5 mg by mouth daily.    [provider]  loperamide (IMODIUM) 2 MG capsule Take 2 mg by mouth as needed.  06/21/16   [provider]  lovastatin (MEVACOR) 40 MG tablet Take 40 mg by mouth daily.     [provider]  Melatonin 10 MG CAPS Take by mouth. 05/30/16   [provider]  metFORMIN (GLUCOPHAGE) 500 MG tablet Take 500 mg by mouth 2 (two) times daily with a meal.     [provider]  methotrexate 50 MG/2ML injection Inject into the muscle.    [provider]  oxyCODONE (OXY IR/ROXICODONE) 5 MG immediate release tablet Take 1 tablet (5 mg total) by mouth every 6 (six) hours as needed for severe pain. Must last 30 days 01/15/19 02/14/19  Milinda Pointer, MD  phenazopyridine (PYRIDIUM) 200 MG tablet Take 1 tablet (200 mg total) by mouth 3 (three) times daily. 05/16/18   Lorin Picket, PA-C  pregabalin (LYRICA) 150 MG capsule Take 1 capsule (150 mg total) by mouth 3 (three) times daily. 11/06/18 12/06/18  Milinda Pointer, MD  promethazine (PHENERGAN) 25 MG tablet TAKE 1 TABLET BY MOUTH EVERY 6 HOURS AS NEEDED NAUSEA OR VOMITING 10/18/17   [provider]  QUEtiapine (SEROQUEL) 300 MG tablet TAKE 1 TABLET BY MOUTH EVERYDAY AT BEDTIME 11/30/18   Clapacs, Madie Reno, MD  Vitamin D, Ergocalciferol, (DRISDOL) 50000 units CAPS capsule Take by mouth.    [provider]  zolpidem (AMBIEN) 5 MG tablet TAKE 1 TABLET BY MOUTH EVERYDAY AT BEDTIME 11/30/18   Clapacs, Madie Reno, MD    Allergies Codeine, Propoxyphene, Sulfa antibiotics, and Hydrocodone  Family History  Problem Relation Age of Onset  . Heart failure Father   .  Bipolar disorder Father   . Alcohol abuse Father   . Anxiety disorder Father   . Depression Father   . Heart disease Brother   . Heart attack Brother 78       MI s/p stents placed  . Anxiety disorder Sister   . Depression Sister   . Anxiety disorder Sister   . Depression Sister   . Bipolar disorder Sister   . Alcohol abuse Sister   . Drug abuse Sister   . Heart attack Brother     Social History Social History   Tobacco Use  . Smoking status: Former Smoker    Packs/day: 2.00    Years: 27.00    Pack years: 54.00    Types:  Cigarettes    Quit date: 07/30/1999    Years since quitting: 19.5  . Smokeless tobacco: Never Used  Substance Use Topics  . Alcohol use: No  . Drug use: No    Review of Systems  Constitutional: Negative for fever. Respiratory: Negative for cough or shortness of breath.  Musculoskeletal: Negative for myalgias Skin: Positive for tender, erythematous area on the lower abdomen. Neurological: Negative for numbness or paresthesias. ____________________________________________   PHYSICAL EXAM:  VITAL SIGNS: ED Triage Vitals  Enc Vitals Group     BP 02/03/19 1133 (!) 142/82     Pulse Rate 02/03/19 1133 79     Resp 02/03/19 1133 18     Temp 02/03/19 1133 98.1 F (36.7 C)     Temp Source 02/03/19 1133 Oral     SpO2 02/03/19 1133 98 %     Weight 02/03/19 1135 (!) 330 lb (149.7 kg)     Height 02/03/19 1135 5\' 6"  (1.676 m)     Head Circumference --      Peak Flow --      Pain Score 02/03/19 1135 10     Pain Loc --      Pain Edu? --      Excl. in GC? --      Constitutional: Chronically ill appearing. Eyes: Conjunctivae are clear without discharge or drainage. Nose: No rhinorrhea noted. Mouth/Throat: Airway is patent.  Neck: No stridor. Unrestricted range of motion observed. Cardiovascular: Capillary refill is <3 seconds.  Respiratory: Respirations are even and unlabored.. Musculoskeletal: Unrestricted range of motion observed. Neurologic:  Awake, alert, and oriented x 4.  Skin: see photo     ____________________________________________   LABS (all labs ordered are listed, but only abnormal results are displayed)  Labs Reviewed - No data to display ____________________________________________  EKG  Not indicated. ____________________________________________  RADIOLOGY  Not indicated. ____________________________________________   PROCEDURES  Procedures ____________________________________________   INITIAL IMPRESSION / ASSESSMENT AND PLAN / ED COURSE  [name] [name] is a 58 y.o. female presenting to the emergency department for treatment and evaluation of tender and red area to her abdomen.  She states that she notices a foul odor coming from the area.  On exam, the area appears to be a local reaction to Lovenox.  She does have yeast in the skin folds of her abdomen which she states she treats with "cream."  There is no drainage or fluctuance in the area of concern.  Vital signs are reassuring.  She is afebrile and not tachycardic or tachypneic.  She was encouraged to see her primary care provider in 2 to 3 days and have her daughter look at the area daily.  If she notices any significant change or worsening, she is to return to the emergency department.   Medications  promethazine (PHENERGAN) tablet 25 mg (25 mg Oral Given 02/03/19 1230)  HYDROmorphone (DILAUDID) injection 1 mg (1 mg Intramuscular Given 02/03/19 1301)     Pertinent labs & imaging results that were available during my care of the patient were reviewed by me and considered in my medical decision making (see chart for details).  ____________________________________________   FINAL CLINICAL IMPRESSION(S) / ED DIAGNOSES  Final diagnoses:  Injection site reaction, initial encounter    ED Discharge Orders    None       Note:  This document was prepared using Dragon voice recognition software and may include unintentional dictation  errors.   02/05/19, FNP 02/03/19 1831  Miguel Aschoff., MD 02/04/19 2228

## 2019-02-03 NOTE — Discharge Instructions (Addendum)
Please follow up with your primary care provider on Monday.  Have your daughter look at the area every day and if it looks worse, see primary care or return to the ER.  Take your pain and nausea medication as prescribed.

## 2019-02-04 ENCOUNTER — Ambulatory Visit: Admit: 2019-02-04 | Discharge: 2019-02-05 | Disposition: A | Discharge status: Home / Self Care | Payer: MEDICAID

## 2019-02-04 ENCOUNTER — Encounter: Admit: 2019-02-04 | Discharge: 2019-02-05 | Disposition: A | Discharge status: Home / Self Care | Payer: MEDICAID

## 2019-02-04 DIAGNOSIS — L03311 Cellulitis of abdominal wall: Secondary | ICD-10-CM | POA: Insufficient documentation

## 2019-02-04 LAB — BASIC METABOLIC PANEL
ANION GAP: 3 mmol/L — ABNORMAL LOW (ref 7–15)
BLOOD UREA NITROGEN: 11 mg/dL (ref 7–21)
BUN / CREAT RATIO: 13
CALCIUM: 8.7 mg/dL (ref 8.5–10.2)
CHLORIDE: 108 mmol/L — ABNORMAL HIGH (ref 98–107)
CREATININE: 0.88 mg/dL (ref 0.60–1.00)
EGFR CKD-EPI AA FEMALE: 84 mL/min/{1.73_m2} (ref >=60)
GLUCOSE RANDOM: 213 mg/dL — ABNORMAL HIGH (ref 70–179)
POTASSIUM: 3.9 mmol/L (ref 3.5–5.0)
SODIUM: 140 mmol/L (ref 135–145)

## 2019-02-04 LAB — CBC W/ AUTO DIFF
BASOPHILS RELATIVE PERCENT: 0.6 %
EOSINOPHILS ABSOLUTE COUNT: 0 10*9/L (ref 0.0–0.4)
EOSINOPHILS RELATIVE PERCENT: 0 %
HEMATOCRIT: 36.1 % (ref 36.0–46.0)
HEMOGLOBIN: 11.6 g/dL — ABNORMAL LOW (ref 12.0–16.0)
LARGE UNSTAINED CELLS: 2 % (ref 0–4)
LYMPHOCYTES ABSOLUTE COUNT: 1.4 10*9/L — ABNORMAL LOW (ref 1.5–5.0)
LYMPHOCYTES RELATIVE PERCENT: 28.7 %
MEAN CORPUSCULAR HEMOGLOBIN CONC: 32 g/dL (ref 31.0–37.0)
MEAN CORPUSCULAR HEMOGLOBIN: 27 pg (ref 26.0–34.0)
MEAN CORPUSCULAR VOLUME: 84.3 fL (ref 80.0–100.0)
MONOCYTES ABSOLUTE COUNT: 0.4 10*9/L (ref 0.2–0.8)
MONOCYTES RELATIVE PERCENT: 7.3 %
NEUTROPHILS ABSOLUTE COUNT: 3.1 10*9/L (ref 2.0–7.5)
NEUTROPHILS RELATIVE PERCENT: 61.3 %
PLATELET COUNT: 178 10*9/L (ref 150–440)
RED BLOOD CELL COUNT: 4.29 10*12/L (ref 4.00–5.20)
RED CELL DISTRIBUTION WIDTH: 17.2 % — ABNORMAL HIGH (ref 12.0–15.0)
WBC ADJUSTED: 5 10*9/L (ref 4.5–11.0)

## 2019-02-04 LAB — ANION GAP: Anion gap 3:SCnc:Pt:Ser/Plas:Qn:: 3 — ABNORMAL LOW

## 2019-02-04 LAB — PHOSPHORUS: Phosphate:MCnc:Pt:Ser/Plas:Qn:: 4.7

## 2019-02-04 LAB — MAGNESIUM: Magnesium:MCnc:Pt:Ser/Plas:Qn:: 1.5 — ABNORMAL LOW

## 2019-02-04 LAB — LYMPHOCYTES ABSOLUTE COUNT: Lymphocytes:NCnc:Pt:Bld:Qn:Automated count: 1.4 — ABNORMAL LOW

## 2019-02-04 LAB — LACTATE BLOOD VENOUS: Lactate:SCnc:Pt:BldV:Qn:: 1.2

## 2019-02-04 NOTE — Unmapped (Addendum)
Ms. Bohan is a 58 y/o F with PMHx of RA and recurrent skin infections who presented to [facility] with LLQ cellulitis.      Cellulitis  Patient with history of rheumatoid arthritis, recurrent skin infections due to immunosuppressive therapy.  Patient with recent hospitalization for perennial abscess on 8/31 and history of MRSA.. Current cellulitis began 2 weeks ago noted on left lower quadrant of abdomen initially with a 2 inch x 2 inch patch that spread over the coming weeks now associated with increased pain, nausea and chills.  Physical exam revealing soft tissue erythema over left lower quadrant abdomen extending to upper thigh with a 2 x 2 centimeter area of fluctuance concerning for abscess.  CT abdomen pelvis without subcutaneous gas or concern for neck//extension cellulitis.  Vital signs stable in ED with lactate 1.2.  Patient with extensive list of antibiotic allergies.  Blood cultures, urine culture and abscess culture drawn.  Patient given 30 cc/kg and started on broad-spectrum antibiotics (Vanc/Zosyn). Zosyn was discontinued as likely gram positive skin infection. Vancomycin was transitioned to clindamycin empirically with plan to complete 7 day course given history of MRSA. Advised patient on close outpatient followup with PCP later this week and counseled on signs of worsening infection or failed antibiotics.    Rheumatoid arthritis  Patient on methotrexate, prednisone taper and was about to begin tocilizumab but has not received dose.  Currently on Pred 10 mg daily from taper.  Methotrexate dose was due on 10/16 but did not take.  --Continued prednisone 10 mg daily  --Holding methotrexate dosing while inpatient  ??  Type 2 Diabetes Mellitus  --Continued home insulin lantus 50 U nightly  --Continued 16 U of Lispro with meals TID  --placed also on sliding scale insulin while inpatient  ??  Hypothyroidism  --Continued home levothyroxine 88 mcg daily  ??  MDD/bipolar disorder  --Continued home Abilify 5 mg daily --Continued home BuSpar 10 mg twice daily  --Continued home Prozac 20 mg daily

## 2019-02-04 NOTE — Unmapped (Signed)
ED Procedure Note    Incision/Drainage    Date/Time: 02/04/2019 6:54 AM  Performed by: Scot Jun, MD  Authorized by: Darryll Capers, MD     Consent:     Consent obtained:  Verbal    Alternatives discussed:  No treatment, delayed treatment and alternative treatment  Location:     Type:  Abscess    Location:  Trunk    Trunk location:  Abdomen  Pre-procedure details:     Skin preparation:  Chloraprep  Anesthesia (see MAR for exact dosages):     Anesthesia method:  Local infiltration    Local anesthetic:  Lidocaine 1% WITH epi  Procedure type:     Complexity:  Simple  Procedure details:     Incision types:  Single straight    Incision depth:  Dermal    Scalpel blade:  10    Wound management:  Probed and deloculated    Drainage:  Bloody and purulent    Drainage amount:  Moderate    Wound treatment:  Drain placed    Packing materials:  1/2 in iodoform gauze    Amount 1/2 iodoform:  5 in  Post-procedure details:     Patient tolerance of procedure:  Tolerated well, no immediate complications

## 2019-02-04 NOTE — Unmapped (Signed)
Vancomycin Therapeutic Monitoring Pharmacy Note    Amanda Hanson is a 58 y.o. female starting vancomycin. Date of therapy initiation: 02/04/19    Indication: Skin and Soft Tissue Infection (SSTI)    Prior Dosing Information: None/new initiation     Goals:  Therapeutic Drug Levels  Vancomycin trough goal: 10-20 mg/L    Additional Clinical Monitoring/Outcomes  Renal function, volume status (intake and output)    Results: Not applicable    Wt Readings from Last 1 Encounters:   02/04/19 (!) 148.6 kg (327 lb 9.7 oz)     Creatinine   Date Value Ref Range Status   02/04/2019 0.88 0.60 - 1.00 mg/dL Final   16/01/9603 5.40 0.60 - 1.00 mg/dL Final   98/02/9146 8.29 0.60 - 1.00 mg/dL Final        Pharmacokinetic Considerations and Significant Drug Interactions:  ? Adult (estimated initial): Vd = 102 L, ke = 0.108 hr-1  ? Concurrent nephrotoxic meds: not applicable    Assessment/Plan:  Recommendation(s)  ? Start vancomycin 1750 mg IV q8hr   ? Estimated trough on recommended regimen: 15 mg/L    Follow-up  ? Level due: prior to fourth or fifth dose  ? A pharmacist will continue to monitor and order levels as appropriate    Please page service pharmacist with questions/clarifications.    Okey Regal, PharmD
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
## 2019-02-04 NOTE — Unmapped (Signed)
Bed: 06-A  Expected date:   Expected time:   Means of arrival:   Comments:  charge

## 2019-02-04 NOTE — Unmapped (Signed)
Pt alert/oriented x 4, and was able to ambulate from stretcher to bed. Pt complained of pain, administered PRN pain medication as directed. Pt made aware of plan of care. Contact precautions maintained. Call bell within reach, bedside commode in room, and bed in lowest position. Will continue to monitor.    Problem: Adult Inpatient Plan of Care  Goal: Plan of Care Review  Outcome: Progressing  Goal: Patient-Specific Goal (Individualization)  Outcome: Progressing  Goal: Absence of Hospital-Acquired Illness or Injury  Outcome: Progressing  Goal: Optimal Comfort and Wellbeing  Outcome: Progressing  Goal: Readiness for Transition of Care  Outcome: Progressing  Goal: Rounds/Family Conference  Outcome: Progressing     Problem: Infection  Goal: Infection Symptom Resolution  Outcome: Progressing     Problem: Pain Acute  Goal: Optimal Pain Control  Outcome: Progressing     Problem: Fall Injury Risk  Goal: Absence of Fall and Fall-Related Injury  Outcome: Progressing

## 2019-02-04 NOTE — Unmapped (Signed)
Patient states she was taking Lovenox subcutaneous injections and sites in her abd got infected, having chills for several days

## 2019-02-04 NOTE — Unmapped (Signed)
[facility]  Emergency Department Provider Note       ED Clinical Impression     Final diagnoses:   Cellulitis, unspecified cellulitis site (Primary)   Immunosuppressed status (CMS-HCC)   Lactate blood increased        Impression, ED Course, Assessment and Plan     Impression:     58 year old female with history of morbid obesity, arthritis, CHF, T2DM, and recurrent diabetic foot ulcers s/p multiple amputations, and multiple abscesses with recent hospitalization for the same in August presents with concern for abdominal wall abscess.    Exam demonstrating patient in mild distress, normal vital signs, morbid obesity, area of erythema/tenderness and induration over left lower abdominal wall without fluctuance.  No crepitus.  No evidence for necrotizing soft tissue infection.  Suspect abdominal wall abscess versus cellulitis, will seek to exclude deeper extension CT abdomen pelvis and anticipate surgical consult.  Will perform screening urinalysis, screening abdominal labs.  We will seek to exclude sepsis with lactate.  Plan to obtain blood cultures.  Analgesia and antinausea medicine, will start with 1 L LR, augment based on BMP and lactate.    Lactate elevated to 2.1, will add additional 1.5 L LR bolus.  CT abdomen pelvis without evidence for intra-abdominal or significant intra-abdominal wall process.  Bedside ultrasound revealing 1 x 1 cm collection about 1 cm deep over the left lower abdomen in the area of her cellulitic change.  Abscess drained.  See procedure note for details.  Started empiric vancomycin and Zosyn for immunocompromised cellulitis/abscess.  Admit to medicine.  Discussed with patient impression, she is in agreement with plan.      Additional Medical Decision Making     I independently visualized the EKG tracing.   I independently visualized the radiology images.   I reviewed the patient's prior medical records.   I discussed the case with the surgical consultant. Labs and radiology results that were available during my care of the patient were independently reviewed by me and considered in my medical decision making.    Portions of this record have been created using Scientist, clinical (histocompatibility and immunogenetics). Dictation errors have been sought, but may not have been identified and corrected.  ____________________________________________    Time seen: February 03, 2019 9:17 PM     History     Chief Complaint  Abdominal Pain      HPI   Amanda Hanson is a 58 y.o. female with history of arthritis, CHF, T2DM, and recurrent diabetic foot ulcers s/p multiple amputations who was recently hospitalized 8/25-8/31 with perirectal abscess s/p I&D of perianal and labial abscess on 8/26. Operative findings were Gluteal/perianal abscess with large cavity; penrose drain placed. Labial abscess with small cavity; packing placed. (Vanc (8/26-8/28) and Zosyn (8/26- 8/29). Cultures grew with mixed gram neg and positive and she was transitioned to PO cipro and flaygl for an additional 10 day course of antibiotics.  Patient presents today with concern for abdominal wall infection in the setting of her insulin versus previous Lovenox injections.  Patient reports approximately 2 weeks of erythema and pain with some swelling at the left lower aspect of her abdominal wall.  She reports over the past 2 days symptoms have worsened significantly with also chills and nausea developing.  Denies vomiting.  Denies respiratory symptoms, sore throat, rhinorrhea, coryza, headaches, urinary symptoms, diarrhea or constipation.  No fevers per her report.  No recent antibiotic use.  She is had multiple abscesses in the past most recently in  August.          Past Medical History:   Diagnosis Date   ??? Abnormal mammogram    ??? Anxiety disorder    ??? Arthritis    ??? Congestive heart failure (CHF) (CMS-HCC)    ??? DDD (degenerative disc disease)    ??? Diabetes mellitus type 2 in obese (CMS-HCC) 2011 ??? GERD (gastroesophageal reflux disease)    ??? Hyperlipidemia    ??? Hypothyroidism    ??? Mixed incontinence urge and stress (female)(female)    ??? Obesity    ??? Other functional disorder of bladder    ??? Rheumatoid arthritis(714.0)    ??? Sleep apnea    ??? Urinary frequency    ??? Urinary obstruction, not elsewhere classified        Past Surgical History:   Procedure Laterality Date   ??? BREAST BIOPSY Left 5 15 2017    with clip   ??? CHOLECYSTECTOMY     ??? Cystoscopy with Hydrodistention      11/2004   ??? HERNIA REPAIR     ??? HYSTERECTOMY  2000   ??? Interstim Placement      02/2005   ??? LAPAROSCOPIC GASTRIC BANDING     ??? OOPHORECTOMY Bilateral 2000   ??? PR AMPUTATION METATARSAL+TOE,SINGLE Right 08/24/2018    Procedure: PRIORITY 2nd ray amputation;  Surgeon: Karen Chafe, DPM;  Location: MAIN OR [facility];  Service: Vascular   ??? PR AMPUTATION TOE,MT-P JT Right 02/23/2018    Procedure: AMPUTATION, TOE; METATARSOPHALANGEAL JOINT;  Surgeon: Karen Chafe, DPM;  Location: [facility];  Service: Vascular   ??? PR AMPUTATION TOE,MT-P JT Right 08/02/2018    Procedure: AMPUTATION, TOE; METATARSOPHALANGEAL JOINT;  Surgeon: Webb Silversmith, MD;  Location: MAIN OR [facility];  Service: Vascular   ??? PR DEBRIDEMENT, SKIN, SUB-Q TISSUE,=<20 SQ CM Right 11/15/2017    Procedure: Debridement of right groin/perineal wound;  Surgeon: Joanie Coddington, MD;  Location: MAIN OR [facility];  Service: Trauma   ??? PR DEBRIDEMENT, SKIN, SUB-Q TISSUE,MUSCLE,BONE,=<20 SQ CM Right 02/28/2018    Procedure: DEBRIDEMENT; SKIN, SUBCUTANEOUS TISSUE, MUSCLE, & BONE FOOT;  Surgeon: Karen Chafe, DPM;  Location: MAIN OR [facility];  Service: Vascular   ??? PR DEBRIDEMENT, SKIN, SUB-Q TISSUE,MUSCLE,BONE,=<20 SQ CM Right 03/28/2018    Procedure: DEBRIDEMENT; SKIN, SUBCUTANEOUS TISSUE, MUSCLE, & BONE FOOT;  Surgeon: Karen Chafe, DPM;  Location: MA[facility] OR [facility];  Service: Vascular   ??? PR FULL EXCIS 1ST METATARSAL HEAD Right 03/28/2018 Procedure: OSTECTOMY COMPLT EXC; FIRST METATARSAL HEAD;  Surgeon: Karen Chafe, DPM;  Location: MAIN OR [facility];  Service: Vascular   ??? PR I&D OF VULVA/PERINEUM ABSCESS Left 12/13/2018    Procedure: Incision And Drainage Of Vulva Or Perineal Abscess;  Surgeon: Joanie Coddington, MD;  Location: MAIN OR [facility];  Service: Trauma   ??? PR I&D PERIANAL ABSCESS,SUPERFICIAL Left 12/13/2018    Procedure: INCISION AND DRAINAGE, PERIANAL ABSCESS, SUPERFICIAL;  Surgeon: Joanie Coddington, MD;  Location: MAIN OR [facility];  Service: Trauma   ??? PR INCIS/DRAIN THIGH/KNEE ABSCESS,DEEP Left 06/14/2018    Procedure: Deep thigh abscess drainage;  Surgeon: Suella Broad, MD;  Location: MAIN OR [facility];  Service: Trauma   ??? PR SECD CLOS SURG WND EXTEN/COMPLIC Right 02/28/2018    Procedure: SECONDARY CLOSURE OF SURGICAL WOUND OR DEHISCENCE, EXTENSIVE OR COMPLICATED;  Surgeon: Karen Chafe, DPM;  Location: MAIN OR [facility];  Service: Vascular         Current Facility-Administered  Medications:   ???  acetaminophen (TYLENOL) tablet 650 mg, 650 mg, Oral, Q6H PRN, Bertram Gala, MD  ???  ARIPiprazole (ABILIFY) tablet 5 mg, 5 mg, Oral, Daily, Bertram Gala, MD  ???  atorvastatin (LIPITOR) tablet 80 mg, 80 mg, Oral, QPM, Bertram Gala, MD  ???  busPIRone (BUSPAR) tablet 10 mg, 10 mg, Oral, BID, Bertram Gala, MD  ???  dextrose 50 % in water (D50W) 50 % solution 12.5 g, 12.5 g, Intravenous, Q10 Min PRN, Bertram Gala, MD  ???  diclofenac sodium (VOLTAREN) 1 % gel 2 g, 2 g, Topical, Nightly, Bertram Gala, MD  ???  enoxaparin (LOVENOX) syringe 40 mg, 40 mg, Subcutaneous, BID, Bertram Gala, MD  ???  FLUoxetine (PROzac) capsule 20 mg, 20 mg, Oral, Daily, Bertram Gala, MD  ???  folic acid (FOLVITE) tablet 1 mg, 1 mg, Oral, Daily, Bertram Gala, MD  ???  HYDROmorphone (PF) (DILAUDID) injection 1 mg, 1 mg, Intravenous, Q3H PRN, Scot Jun, MD, 1 mg at 02/04/19 0424 ???  insulin glargine (LANTUS) injection 50 Units, 50 Units, Subcutaneous, Daily, Bertram Gala, MD  ???  insulin lispro (HumaLOG) injection 0-12 Units, 0-12 Units, Subcutaneous, ACHS, Bertram Gala, MD  ???  insulin lispro (HumaLOG) injection 16 Units, 16 Units, Subcutaneous, TID AC, Bertram Gala, MD  ???  levothyroxine (SYNTHROID) tablet 88 mcg, 88 mcg, Oral, Daily, Bertram Gala, MD  ???  metFORMIN (GLUCOPHAGE) tablet 1,000 mg, 1,000 mg, Oral, BID with meals, Bertram Gala, MD  ???  nystatin (MYCOSTATIN) powder 1 application, 1 application, Topical, 4x Daily PRN, Bertram Gala, MD  ???  pramipexole (MIRAPEX) tablet 0.125 mg, 0.125 mg, Oral, Nightly, Bertram Gala, MD  ???  predniSONE (DELTASONE) tablet 10 mg, 10 mg, Oral, Daily, Bertram Gala, MD  ???  pregabalin (LYRICA) capsule 150 mg, 150 mg, Oral, TID, Bertram Gala, MD  ???  promethazine (PHENERGAN) 12.5 mg in sodium chloride (NS) 0.9 % 25 mL infusion, 12.5 mg, Intravenous, Q6H PRN, Bertram Gala, MD  ???  QUEtiapine (SEROquel) tablet 300 mg, 300 mg, Oral, Nightly, Bertram Gala, MD  ???  senna (SENOKOT) tablet 2 tablet, 2 tablet, Oral, Nightly PRN, Bertram Gala, MD  ???  solifenacin (VESICARE) tablet 5 mg, 5 mg, Oral, Daily, Bertram Gala, MD  ???  vancomycin (VANCOCIN) 3,000 mg in sodium chloride (NS) 0.9 % 1,000 mL IVPB, 3,000 mg, Intravenous, Once, Scot Jun, MD, Last Rate: 360 mL/hr at 02/04/19 0506, 3,000 mg at 02/04/19 0506  ???  zolpidem (AMBIEN) tablet 5 mg, 5 mg, Oral, Nightly PRN, Bertram Gala, MD    Allergies  Cephalexin, Doxycycline, Sulfa (sulfonamide antibiotics), Codeine, Darvocet a500  [propoxyphene n-acetaminophen], Hydrocodone, Meropenem, and Sulfasalazine    Family History   Problem Relation Age of Onset   ??? Cancer Maternal Grandmother         Lung cancer   ??? Diabetes Maternal Grandmother    ??? Rheum arthritis Sister    ??? GU problems Neg Hx    ??? Kidney cancer Neg Hx ??? Prostate cancer Neg Hx        Social History  Social History     Tobacco Use   ??? Smoking status: Former Smoker     Packs/day: 2.00     Years: 27.00     Pack years: 54.00     Types: Cigarettes     Quit date: 2003  Years since quitting: 17.8   ??? Smokeless tobacco: Former Neurosurgeon     Quit date: 2003   ??? Tobacco comment: Started smoking at 23, quit 2003.    Substance Use Topics   ??? Alcohol use: Yes     Alcohol/week: 7.0 standard drinks     Types: 7 Cans of beer per week     Binge frequency: Less than monthly   ??? Drug use: No         Review of Systems    Constitutional: Negative for fever.  Eyes: Negative for visual changes.  ENT: Negative for sore throat.  Cardiovascular: Negative for chest pain.  Respiratory: Negative for shortness of breath.  Gastrointestinal: Negative for vomiting or diarrhea.  Genitourinary: Negative for dysuria.  Musculoskeletal: Negative for back pain.  Skin: Negative for rash.  Neurological: Negative for headaches, weakness or numbness.  10-point ROS reviewed and otherwise negative except as documented       Physical Exam     VITAL SIGNS:    ED Triage Vitals [02/03/19 2104]   Enc Vitals Group      BP 108/74      Heart Rate 85      SpO2 Pulse       Resp 16      Temp 37 ??C (98.6 ??F)      Temp src       SpO2 97 %      Weight       Height       Head Circumference       Peak Flow       Pain Score       Pain Loc       Pain Edu?       Excl. in GC?        Constitutional: Alert and oriented.  Morbidly obese appearing, mildly distressed.  Eyes: Conjunctivae are normal.  ENT       Head: Normocephalic and atraumatic.       Nose: No congestion.       Mouth/Throat: Mucous membranes are moist.       Neck: No stridor.  Cardiovascular: Normal rate, regular rhythm.   Respiratory: Normal respiratory effort. Breath sounds are normal.  Gastrointestinal: Soft, area of erythema/tenderness and induration over left lower abdominal wall without fluctuance.  No crepitus. Musculoskeletal: No lower extremity tenderness or edema.  She is missing her first and second right lower digits.  Surgical site appears well-healed.  Neurologic: Normal speech and language. No gross focal neurologic deficits are appreciated.  Skin: Skin is warm, dry and intact. No rash noted.  Psychiatric: Mood and affect are normal. Speech and behavior are normal.           Radiology     CT Abdomen Pelvis W IV Contrast Only   Preliminary Result   - No soft tissue gas or evidence of infection involving the soft tissues of the visualized abdomen or pelvis.    - Small fat and fluid containing ventral hernias, which are grossly unchanged from prior exam.    - Other chronic and incidental findings, as detailed above.       ED POCUS Cyst Aspiration Guidance    (Results Pending)       I independently visualized these images.         Scot Jun, MD  Resident  02/04/19 [phone]

## 2019-02-05 LAB — BASIC METABOLIC PANEL
ANION GAP: 11 mmol/L (ref 7–15)
BLOOD UREA NITROGEN: 14 mg/dL (ref 7–21)
BUN / CREAT RATIO: 12
CALCIUM: 8.4 mg/dL — ABNORMAL LOW (ref 8.5–10.2)
CHLORIDE: 108 mmol/L — ABNORMAL HIGH (ref 98–107)
CO2: 25 mmol/L (ref 22.0–30.0)
CREATININE: 1.18 mg/dL — ABNORMAL HIGH (ref 0.60–1.00)
EGFR CKD-EPI NON-AA FEMALE: 51 mL/min/{1.73_m2} — ABNORMAL LOW (ref >=60)
GLUCOSE RANDOM: 163 mg/dL (ref 70–179)
POTASSIUM: 3.4 mmol/L — ABNORMAL LOW (ref 3.5–5.0)
SODIUM: 144 mmol/L (ref 135–145)

## 2019-02-05 LAB — CBC W/ AUTO DIFF
BASOPHILS ABSOLUTE COUNT: 0.1 10*9/L (ref 0.0–0.1)
BASOPHILS RELATIVE PERCENT: 0.7 %
HEMATOCRIT: 36.8 % (ref 36.0–46.0)
HEMOGLOBIN: 11.6 g/dL — ABNORMAL LOW (ref 12.0–16.0)
LARGE UNSTAINED CELLS: 1 % (ref 0–4)
LYMPHOCYTES ABSOLUTE COUNT: 1.5 10*9/L (ref 1.5–5.0)
LYMPHOCYTES RELATIVE PERCENT: 21.4 %
MEAN CORPUSCULAR HEMOGLOBIN CONC: 31.6 g/dL (ref 31.0–37.0)
MEAN CORPUSCULAR HEMOGLOBIN: 26.9 pg (ref 26.0–34.0)
MEAN CORPUSCULAR VOLUME: 85 fL (ref 80.0–100.0)
MEAN PLATELET VOLUME: 8.9 fL (ref 7.0–10.0)
MONOCYTES ABSOLUTE COUNT: 0.4 10*9/L (ref 0.2–0.8)
MONOCYTES RELATIVE PERCENT: 5.9 %
NEUTROPHILS ABSOLUTE COUNT: 4.9 10*9/L (ref 2.0–7.5)
NEUTROPHILS RELATIVE PERCENT: 70.7 %
PLATELET COUNT: 190 10*9/L (ref 150–440)
RED BLOOD CELL COUNT: 4.33 10*12/L (ref 4.00–5.20)
WBC ADJUSTED: 6.9 10*9/L (ref 4.5–11.0)

## 2019-02-05 LAB — GLUCOSE RANDOM: Glucose:MCnc:Pt:Ser/Plas:Qn:: 163

## 2019-02-05 LAB — PHOSPHORUS: Phosphate:MCnc:Pt:Ser/Plas:Qn:: 4.2

## 2019-02-05 LAB — MICROCYTES

## 2019-02-05 LAB — MAGNESIUM: Magnesium:MCnc:Pt:Ser/Plas:Qn:: 1.5 — ABNORMAL LOW

## 2019-02-05 MED ORDER — CLINDAMYCIN HCL 300 MG CAPSULE: 300 mg | capsule | Freq: Four times a day (QID) | 0 refills | 7 days | Status: AC

## 2019-02-05 MED FILL — CLINDAMYCIN HCL 300 MG CAPSULE: 7 days supply | Qty: 28 | Fill #0 | Status: AC

## 2019-02-05 MED FILL — CLINDAMYCIN HCL 300 MG CAPSULE: ORAL | 7 days supply | Qty: 28 | Fill #0
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
## 2019-02-05 NOTE — Unmapped (Signed)
Problem: Adult Inpatient Plan of Care  Goal: Plan of Care Review  Outcome: Progressing  Goal: Patient-Specific Goal (Individualization)  Outcome: Progressing  Goal: Absence of Hospital-Acquired Illness or Injury  Outcome: Progressing  Goal: Optimal Comfort and Wellbeing  Outcome: Progressing  Goal: Readiness for Transition of Care  Outcome: Progressing  Goal: Rounds/Family Conference  Outcome: Progressing     Problem: Infection  Goal: Infection Symptom Resolution  Outcome: Progressing     Problem: Pain Acute  Goal: Optimal Pain Control  Outcome: Progressing     Problem: Fall Injury Risk  Goal: Absence of Fall and Fall-Related Injury  Outcome: Progressing     Problem: Wound  Goal: Optimal Wound Healing  Outcome: Progressing   Patient treated for cellulitis has complained of pain to her abdomen. Pain treated with prn regimen and antibiotics administered as prescribed. Patient reports decrease in pain with prn regimen, vitals with in parameters patient remains afebrile. Blood glucose trending down to desired parameters will continue to monitor and assess.

## 2019-02-05 NOTE — Unmapped (Signed)
Patient alert & oriented x 4, able to ambulate to the bathroom  with vital signs stable, left lower abdomen with packing placed by MD during the day, pain well controlled with po tylenol med. IV antibiotic given as ordered & tolerated by pt. Will continue to monitor.  Problem: Adult Inpatient Plan of Care  Goal: Plan of Care Review  Outcome: Progressing  Goal: Patient-Specific Goal (Individualization)  Outcome: Progressing  Goal: Absence of Hospital-Acquired Illness or Injury  Outcome: Progressing  Goal: Optimal Comfort and Wellbeing  Outcome: Progressing  Goal: Readiness for Transition of Care  Outcome: Progressing  Goal: Rounds/Family Conference  Outcome: Progressing     Problem: Infection  Goal: Infection Symptom Resolution  Outcome: Progressing     Problem: Pain Acute  Goal: Optimal Pain Control  Outcome: Progressing     Problem: Fall Injury Risk  Goal: Absence of Fall and Fall-Related Injury  Outcome: Progressing     Problem: Wound  Goal: Optimal Wound Healing  Outcome: Progressing     Problem: Cardiac Output Decreased (Heart Failure)  Goal: Optimal Cardiac Output  Outcome: Progressing

## 2019-02-05 NOTE — Unmapped (Signed)
Physician Discharge Summary    Admit date: 02/03/2019    Discharge date and time: 02/05/2019 afternoon    Discharge to: home    Discharge Service: General Medicine (Medicine L service)    Discharge Attending Physician: No att. providers found    Discharge Diagnoses:   Principal Problem:    Cellulitis, abdominal wall - improving  Active Problems:    Major depressive disorder, recurrent episode, moderate (CMS-HCC)    Hypothyroidism    Obesity    Obstructive sleep apnea    Rheumatoid arthritis flare (CMS-HCC)    Mixed incontinence    Hypokalemia due to loss of potassium    Hypomagnesemia    Type 2 diabetes mellitus with hyperglycemia (CMS-HCC)    GERD (gastroesophageal reflux disease)    Mixed anxiety depressive disorder    Bipolar disorder, unspecified (CMS-HCC)    Chronic diastolic CHF (congestive heart failure), NYHA class 3 (CMS-HCC)    COPD (chronic obstructive pulmonary disease) (CMS-HCC)    Procedures:   none    Pertinent Test Results:   Lab Results   Component Value Date    WBC 6.9 02/05/2019    HGB 11.6 (L) 02/05/2019    HCT 36.8 02/05/2019    PLT 190 02/05/2019     Hospital Course:      Amanda Hanson is a 58 y/o F with PMHx of RA and recurrent skin infections who presented to [facility] with LLQ cellulitis.      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  Patient with recent hospitalization for perennial abscess on 8/31 and history of MRSA.. Current cellulitis began 2 weeks ago noted on left lower quadrant of abdomen initially with a 2 inch x 2 inch patch that spread over the coming weeks now associated with increased pain, nausea and chills.  Physical exam revealing soft tissue erythema over left lower quadrant abdomen extending to upper thigh with a 2 x 2 centimeter area of fluctuance concerning for abscess.  CT abdomen pelvis without subcutaneous gas or concern for neck//extension cellulitis.  Vital signs stable in ED with lactate 1.2.  Patient with extensive list of antibiotic allergies.  Blood cultures, urine culture and abscess culture drawn.  Patient given 30 cc/kg and started on broad-spectrum antibiotics (Vanc/Zosyn). Zosyn was discontinued as likely gram positive skin infection. Vancomycin was transitioned to clindamycin empirically with plan to complete 7 day course given history of MRSA. Advised patient on close outpatient followup with PCP later this week and counseled on signs of worsening infection or failed antibiotics.    Rheumatoid arthritis  Patient on methotrexate, prednisone taper and was about to begin tocilizumab but has not received dose.  Currently on Pred 10 mg daily from taper.  Methotrexate dose was due on 10/16 but did not take.  --Continued prednisone 10 mg daily  --Holding methotrexate dosing while inpatient  ??  Type 2 Diabetes Mellitus  --Continued home insulin lantus 50 U nightly  --Continued 16 U of Lispro with meals TID  --placed also on sliding scale insulin while inpatient  ??  Hypothyroidism  --Continued home levothyroxine 88 mcg daily  ??  MDD/bipolar disorder  --Continued home Abilify 5 mg daily  --Continued home BuSpar 10 mg twice daily  --Continued home Prozac 20 mg daily       Condition at Discharge: good Discharge Medications:      Your Medication List      START taking these medications    clindamycin 300 MG capsule  Commonly known as: CLEOCIN  Take 1 capsule (300 mg total) by mouth Four (4) times a day for 7 days.        CONTINUE taking these medications    acetaminophen 500 MG tablet  Commonly known as: TYLENOL  Take 500 mg by mouth Three (3) times a day as needed for pain.     ACTEMRA ACTPEN 162 mg/0.9 mL Pnij  Generic drug: tocilizumab  Inject the contents of 1 pen (162 mg) under the skin every seven (7) days.     ARIPiprazole 5 MG tablet  Commonly known as: ABILIFY  Take 5 mg by mouth daily.     atorvastatin 80 MG tablet  Commonly known as: LIPITOR  Take 1 tablet (80 mg total) by mouth every evening.     blood sugar diagnostic Strp  Commonly known as: ACCU-CHEK GUIDE TEST STRIPS  by Other route Four (4) times a day (before meals and nightly). Acuu-chek Guide.     busPIRone 10 MG tablet  Commonly known as: BUSPAR  Take 10 mg by mouth Two (2) times a day.     calcium-vitamin D 250-100 mg-unit per tablet  Take 1 tablet by mouth Two (2) times a day.     diclofenac sodium 1 % gel  Commonly known as: VOLTAREN  Apply 2 g topically nightly.     empty container Misc  Use as directed     FLUoxetine 20 MG capsule  Commonly known as: PROzac  Take 20 mg by mouth daily.     folic acid 1 MG tablet  Commonly known as: FOLVITE  Take 1 tablet (1 mg total) by mouth daily.     insulin lispro 100 unit/mL injection  Commonly known as: HumaLOG U-100 Insulin  INJECT 0.20ML (20 UNITS TOTAL) UNDER THE SKIN THREE TIMES A DAY BEFORE MEALS.     insulin syringes (disposable) 1 mL Syrg  100 Units by Miscellaneous route daily. Insulin injecting diabetes 250.00  Inject daily  100 unit syringe (1ml)     levothyroxine 88 MCG tablet  Commonly known as: SYNTHROID  TAKE 1 TABLET BY MOUTH EVERY DAY     lisinopriL 2.5 MG tablet  Commonly known as: PRINIVIL,ZESTRIL  Take 1 tablet (2.5 mg total) by mouth daily.     LYRICA 150 MG capsule Generic drug: pregabalin  Take 150 mg by mouth Three (3) times a day.     metFORMIN 500 MG tablet  Commonly known as: GLUCOPHAGE  Take 2 tablets (1,000 mg total) by mouth 2 (two) times a day with meals.     methotrexate 25 mg/mL injection solution  Inject 1 mL (25 mg total) under the skin once a week.     nystatin 100,000 unit/gram powder  Commonly known as: MYCOSTATIN  Apply 1 application topically 4 (four) times a day as needed.     pramipexole 0.125 MG tablet  Commonly known as: MIRAPEX  Take 1 tablet (0.125 mg total) by mouth nightly. Can increase to 2 tablets (0.250 mg) in one week if one tablet is not effective.     predniSONE 5 MG tablet  Commonly known as: DELTASONE  Take 20 mg daily x 2 weeks, 15 mg daily x 2 weeks, 10 mg daily x 2 weeks, then 5 mg until visit with rheumatology     promethazine 25 MG tablet  Commonly known as: PHENERGAN  Take 25 mg by mouth every six (6) hours as needed for nausea. Indications: nausea and vomiting after surgery  QUEtiapine 300 MG tablet  Commonly known as: SEROquel  Take 1 tablet (300 mg total) by mouth nightly.     senna 8.6 mg tablet  Commonly known as: SENOKOT  Take 2 tablets by mouth nightly as needed for constipation.     solifenacin 5 MG tablet  Commonly known as: VESICARE  Take 1 tablet (5 mg total) by mouth daily.     TRESIBA FLEXTOUCH U-200 200 unit/mL (3 mL) Inpn  Generic drug: insulin degludec  Use daily as directed by MD - start with 65 units daily, up to 100 units daily.     zolpidem 5 MG tablet  Commonly known as: AMBIEN  Take 5 mg by mouth nightly as needed for sleep.            Pending Test Results:     Pending Labs     Order Current Status    Blood Culture #1 Preliminary result    Blood Culture #2 Preliminary result          Discharge Instructions:     Diet Instructions     Discharge diet (specify)      Discharge Nutrition Therapy: General        Other Instructions Call MD for:  redness, tenderness, or signs of infection (pain, swelling, redness, odor or green/yellow discharge around incision site)      Call MD for: Temperature > 38.5 Celsius ( > 101.3 Fahrenheit)      Discharge instructions      Discharge instructions      You were hospitalized here at [facility] for a cellulitis (skin infection). You should take clindamycin (cleomycin) for the next 7 days.    If the skin on your abdomen looks worse -- more red, more swelling, draining pus -- or if you start spiking fevers, you should call your primary care doctor.    You should call your primary care doctor to schedule an appointment to see them later this week.    If you think the clindamycin antibiotic is not working, you should call your primary care doctor.    It was a privilege to take care of you. Have a great day!         Discharge instructions to patient: Call your primary care doctor and make an appointment to see them:      Within 2 weeks from the time you are discharged from the hospital         Discharge instructions to patient: Call your primary care doctor and make an appointment to see them:      Later this week             Follow Up instructions and Outpatient Referrals     Call MD for:  redness, tenderness, or signs of infection (pain, swelling, redness, odor or green/yellow discharge around incision site)      Call MD for: Temperature > 38.5 Celsius ( > 101.3 Fahrenheit)      Discharge instructions      Discharge instructions          Appointments which have been scheduled for you    Feb 08, 2019  8:20 AM  (Arrive by 8:05 AM)  OFFICE VISIT with Amanda Senters, FNP  [facility] Primary [facility] at [facility] REGION) [address]  [place] [address]  [phone]      Mar 12, 2019  3:00 PM  (Arrive by 2:45 PM)  RETURN  RHEUMATOLOGY with Shruti Chandramouli,  MD  [facility] [address] [place] [facility] REGION) [id number] [place] [address]  [phone]      Mar 19, 2019 10:00 AM  (Arrive by 9:45 AM)  OFFICE VISIT with Amanda Senters, FNP  [facility] Primary Care at [facility] REGION) [address]  [place] [address]  [phone]              I spent less than 30 minutes in the discharge of this patient.    Fanny Dance, MD

## 2019-02-08 ENCOUNTER — Ambulatory Visit: Admit: 2019-02-08 | Discharge: 2019-02-09 | Payer: MEDICAID | Attending: Family | Primary: Family

## 2019-02-08 DIAGNOSIS — B370 Candidal stomatitis: Principal | ICD-10-CM

## 2019-02-08 DIAGNOSIS — L03311 Cellulitis of abdominal wall: Principal | ICD-10-CM

## 2019-02-08 MED ORDER — NYSTATIN 100,000 UNIT/ML ORAL SUSPENSION: 500000 [IU] | mL | Freq: Four times a day (QID) | 0 refills | 3 days | Status: AC
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
## 2019-02-08 NOTE — Unmapped (Signed)
Patient Education        Cellulitis: Care Instructions  Your Care Instructions     Cellulitis is a skin infection caused by bacteria, most often strep or staph. It often occurs after a break in the skin from a scrape, cut, bite, or puncture, or after a rash.  Cellulitis may be treated without doing tests to find out what caused it. But your doctor may do tests, if needed, to look for a specific bacteria, like methicillin-resistant Staphylococcus aureus (MRSA).  The doctor has checked you carefully, but problems can develop later. If you notice any problems or new symptoms, get medical treatment right away.  Follow-up care is a key part of your treatment and safety. Be sure to make and go to all appointments, and call your doctor if you are having problems. It's also a good idea to know your test results and keep a list of the medicines you take.  How can you care for yourself at home?  ?? Take your antibiotics as directed. Do not stop taking them just because you feel better. You need to take the full course of antibiotics.  ?? Prop up the infected area on pillows to reduce pain and swelling. Try to keep the area above the level of your heart as often as you can.  ?? If your doctor told you how to care for your wound, follow your doctor's instructions. If you did not get instructions, follow this general advice:  ? Wash the wound with clean water 2 times a day. Don't use hydrogen peroxide or alcohol, which can slow healing.  ? You may cover the wound with a thin layer of petroleum jelly, such as Vaseline, and a nonstick bandage.  ? Apply more petroleum jelly and replace the bandage as needed.  ?? Be safe with medicines. Take pain medicines exactly as directed.  ? If the doctor gave you a prescription medicine for pain, take it as prescribed.  ? If you are not taking a prescription pain medicine, ask your doctor if you can take an over-the-counter medicine.  To prevent cellulitis in the future ?? Try to prevent cuts, scrapes, or other injuries to your skin. Cellulitis most often occurs where there is a break in the skin.  ?? If you get a scrape, cut, mild burn, or bite, wash the wound with clean water as soon as you can to help avoid infection. Don't use hydrogen peroxide or alcohol, which can slow healing.  ?? If you have swelling in your legs (edema), support stockings and good skin care may help prevent leg sores and cellulitis.  ?? Take care of your feet, especially if you have diabetes or other conditions that increase the risk of infection. Wear shoes and socks. Do not go barefoot. If you have athlete's foot or other skin problems on your feet, talk to your doctor about how to treat them.  When should you call for help?   Call your doctor now or seek immediate medical care if:  ?? ?? You have signs that your infection is getting worse, such as:  ? Increased pain, swelling, warmth, or redness.  ? Red streaks leading from the area.  ? Pus draining from the area.  ? A fever.   ?? ?? You get a rash.   Watch closely for changes in your health, and be sure to contact your doctor if:  ?? ?? You do not get better as expected.   Where can you learn more?  Go to [facility] at [email or web address]  Select Patient Education under [employer]. Enter [phone] in the search box to learn more about Cellulitis: Care Instructions.  Current as of: October 19, 2018??????????????????????????????Content Version: 12.6  ?? 2006-2020 [employer].   Care instructions adapted under license by [facility]. If you have questions about a medical condition or this instruction, always ask your healthcare professional. [employer], Inc[employer] disclaims any warranty or liability for your use of this information.

## 2019-02-11 ENCOUNTER — Ambulatory Visit
Admit: 2019-02-11 | Discharge: 2019-02-13 | Disposition: A | Discharge status: Home / Self Care | Payer: MEDICAID | Admitting: Internal Medicine

## 2019-02-11 LAB — COMPREHENSIVE METABOLIC PANEL
ALBUMIN: 4.1 g/dL (ref 3.5–5.0)
ALKALINE PHOSPHATASE: 77 U/L (ref 38–126)
ALT (SGPT): 13 U/L (ref <35)
AST (SGOT): 19 U/L (ref 14–38)
BILIRUBIN TOTAL: 0.8 mg/dL (ref 0.0–1.2)
BUN / CREAT RATIO: 7
CALCIUM: 9.9 mg/dL (ref 8.5–10.2)
CHLORIDE: 104 mmol/L (ref 98–107)
CO2: 27 mmol/L (ref 22.0–30.0)
CREATININE: 1.78 mg/dL — ABNORMAL HIGH (ref 0.60–1.00)
EGFR CKD-EPI AA FEMALE: 36 mL/min/{1.73_m2} — ABNORMAL LOW (ref >=60)
EGFR CKD-EPI NON-AA FEMALE: 31 mL/min/{1.73_m2} — ABNORMAL LOW (ref >=60)
GLUCOSE RANDOM: 280 mg/dL — ABNORMAL HIGH (ref 70–179)
POTASSIUM: 3.6 mmol/L (ref 3.5–5.0)
SODIUM: 143 mmol/L (ref 135–145)

## 2019-02-11 LAB — URINALYSIS WITH CULTURE REFLEX
BILIRUBIN UA: NEGATIVE
BLOOD UA: NEGATIVE
GLUCOSE UA: 150 — AB
NITRITE UA: NEGATIVE
PROTEIN UA: NEGATIVE
RBC UA: 2 /HPF (ref <=4)
SPECIFIC GRAVITY UA: 1.005 (ref 1.003–1.030)
SQUAMOUS EPITHELIAL: <1 /HPF (ref 0–5)
UROBILINOGEN UA: 0.2
WBC UA: 11 /HPF — ABNORMAL HIGH (ref 0–5)

## 2019-02-11 LAB — CBC W/ AUTO DIFF
BASOPHILS ABSOLUTE COUNT: 0 10*9/L (ref 0.0–0.1)
BASOPHILS RELATIVE PERCENT: 0.6 %
EOSINOPHILS ABSOLUTE COUNT: 0 10*9/L (ref 0.0–0.4)
EOSINOPHILS RELATIVE PERCENT: 0 %
HEMATOCRIT: 40.5 % (ref 36.0–46.0)
HEMOGLOBIN: 13.3 g/dL (ref 12.0–16.0)
LARGE UNSTAINED CELLS: 1 % (ref 0–4)
LYMPHOCYTES ABSOLUTE COUNT: 1 10*9/L — ABNORMAL LOW (ref 1.5–5.0)
LYMPHOCYTES RELATIVE PERCENT: 17.3 %
MEAN CORPUSCULAR HEMOGLOBIN CONC: 32.8 g/dL (ref 31.0–37.0)
MONOCYTES ABSOLUTE COUNT: 0.1 10*9/L — ABNORMAL LOW (ref 0.2–0.8)
MONOCYTES RELATIVE PERCENT: 2 %
NEUTROPHILS ABSOLUTE COUNT: 4.5 10*9/L (ref 2.0–7.5)
NEUTROPHILS RELATIVE PERCENT: 79.1 %
PLATELET COUNT: 180 10*9/L (ref 150–440)
RED BLOOD CELL COUNT: 4.89 10*12/L (ref 4.00–5.20)
RED CELL DISTRIBUTION WIDTH: 16 % — ABNORMAL HIGH (ref 12.0–15.0)

## 2019-02-11 LAB — TROPONIN I: Troponin I.cardiac:MCnc:Pt:Ser/Plas:Qn:: <0.034

## 2019-02-11 LAB — ALBUMIN: Albumin:MCnc:Pt:Ser/Plas:Qn:: 4.1

## 2019-02-11 LAB — MEAN CORPUSCULAR HEMOGLOBIN: Lab: 27.1

## 2019-02-11 LAB — RBC UA: Erythrocytes:Naric:Pt:Urine sed:Qn:Microscopy.light.HPF: 2

## 2019-02-11 LAB — LIPASE: Triacylglycerol lipase:CCnc:Pt:Ser/Plas:Qn:: 166

## 2019-02-11 NOTE — Unmapped (Signed)
General Surgery Consult Note    Requesting Attending Physician:  Nicholes Calamity*  Service Requesting Consult:  Emergency Medicine    Assessment:  Amanda Hanson is a 58 y.o. female with history of RA on methotrexate, cellulitis on clindamycin with recent I&D of left side of abdomen, CHF, obesity, hernia repair in early 2000s, and DM who presents with abdominal pain, nausea, and diarrhea.    On CT she was found to have increased size of a previously known right paramedian hernia up to 7.8 x 4.3 cm without bowel trapped.This is on the opposite side of her cellulitis. There is no erythema or erythema over the site. On bedside ultrasound there is no flow.     This hernia contains only fat and does not involve bowel. She is having diarrhea in the setting of being on clindamycin. This is likely due to gastroenteritis vs clindamycin side effect vs c difficile. This hernia does not seem to be playing a part.    PLAN:     Nausea, abdominal pain, and diarrhea:  - management per ED    Abdominal paramedian hernia:  - weight loss clinic referral  - surgical clinic referral    If you have any questions, concerns or changes in the patient's clinical status, please feel free to contact [facility] consult pager [phone]. Thank you for this interesting consult.    History of Present Illness:   Chief Complaint:  Abdominal pain and diarrhea    Amanda Hanson is a 58 y.o. female who is seen in consultation for paramedian hernia at the request of Nicholes Calamity* on the Emergency Medicine service. Amanda Hanson is 58 y.o. female with history of RA on methotrexate, cellulitis on clindamycin with recent I&D of left side of abdomen, obesity, hernia repair in early 2000s, and DM who presents with abdominal pain, nausea, and diarrhea. This has been going on for the past 2 days. She was recently started on clindamycin for cellulitis to the L side of her abdomen. She has noticed that the cellulitis is getting better. She has not had a fever or vomiting. She has not had any known covid exposure. Nothing makes it better or worse. Recent change by adding clindamycin, but no additional changes. No history of eating anything strange or different.    Past Medical History:   Past Medical History:   Diagnosis Date   ??? Abnormal mammogram    ??? Anxiety disorder    ??? Arthritis    ??? Congestive heart failure (CHF) (CMS-HCC)    ??? DDD (degenerative disc disease)    ??? Diabetes mellitus type 2 in obese (CMS-HCC) 2011   ??? GERD (gastroesophageal reflux disease)    ??? Hyperlipidemia    ??? Hypothyroidism    ??? Mixed incontinence urge and stress (female)(female)    ??? Obesity    ??? Other functional disorder of bladder    ??? Rheumatoid arthritis(714.0)    ??? Sleep apnea    ??? Urinary frequency    ??? Urinary obstruction, not elsewhere classified        Past Surgical History:  Past Surgical History:   Procedure Laterality Date   ??? BREAST BIOPSY Left 5 15 2017    with clip   ??? CHOLECYSTECTOMY     ??? Cystoscopy with Hydrodistention      11/2004   ??? HERNIA REPAIR     ??? HYSTERECTOMY  2000   ??? Interstim Placement      02/2005   ???  LAPAROSCOPIC GASTRIC BANDING     ??? OOPHORECTOMY Bilateral 2000   ??? PR AMPUTATION METATARSAL+TOE,SINGLE Right 08/24/2018    Procedure: PRIORITY 2nd ray amputation;  Surgeon: Karen Chafe, DPM;  Location: MAIN OR [facility];  Service: Vascular   ??? PR AMPUTATION TOE,MT-P JT Right 02/23/2018 Procedure: AMPUTATION, TOE; METATARSOPHALANGEAL JOINT;  Surgeon: Karen Chafe, DPM;  Location[facility] MAIN [facility];  Service: Vascular   ??? PR AMPUTATION TOE,MT-P JT Right 08/02/2018    Procedure: AMPUTATION, TOE; METATARSOPHALANGEAL JOINT;  Surgeon: Webb Silversmith, MD;  Location: MAIN OR [facility];  Service: Vascular   ??? PR DEBRIDEMENT, SKIN, SUB-Q TISSUE,=<20 SQ CM Right 11/15/2017    Procedure: Debridement of right groin/perineal wound;  Surgeon: Joanie Coddington, MD;  Location: MAIN OR [facility];  Service: Trauma   ??? PR DEBRIDEMENT, SKIN, SUB-Q TISSUE,MUSCLE,BONE,=<20 SQ CM Right 02/28/2018    Procedure: DEBRIDEMENT; SKIN, SUBCUTANEOUS TISSUE, MUSCLE, & BONE FOOT;  Surgeon: Karen Chafe, DPM;  Location: MAIN OR [facility];  Service: Vascular   ??? PR DEBRIDEMENT, SKIN, SUB-Q TISSUE,MUSCLE,BONE,=<20 SQ CM Right 03/28/2018    Procedure: DEBRIDEMENT; SKIN, SUBCUTANEOUS TISSUE, MUSCLE, & BONE FOOT;  Surgeon: Karen Chafe, DPM;  Location: MAIN OR [facility];  Service: Vascular   ??? PR FULL EXCIS 1ST METATARSAL HEAD Right 03/28/2018    Procedure: OSTECTOMY COMPLT EXC; FIRST METATARSAL HEAD;  Surgeon: Karen Chafe, DPM;  Location: MAIN OR [facility];  Service: Vascular   ??? PR I&D OF VULVA/PERINEUM ABSCESS Left 12/13/2018    Procedure: Incision And Drainage Of Vulva Or Perineal Abscess;  Surgeon: Joanie Coddington, MD;  Location: MAIN OR [facility];  Service: Trauma   ??? PR I&D PERIANAL ABSCESS,SUPERFICIAL Left 12/13/2018    Procedure: INCISION AND DRAINAGE, PERIANAL ABSCESS, SUPERFICIAL;  Surgeon: Joanie Coddington, MD;  Location: MAIN OR [facility];  Service: Trauma   ??? PR INCIS/DRAIN THIGH/KNEE ABSCESS,DEEP Left 06/14/2018    Procedure: Deep thigh abscess drainage;  Surgeon: Suella Broad, MD;  Location: MAIN OR [facility];  Service: Trauma   ??? PR SECD CLOS SURG WND EXTEN/COMPLIC Right 02/28/2018 Procedure: SECONDARY CLOSURE OF SURGICAL WOUND OR DEHISCENCE, EXTENSIVE OR COMPLICATED;  Surgeon: Karen Chafe, DPM;  Location: MAIN OR [facility];  Service: Vascular       Medications:  No current facility-administered medications on file prior to encounter.      Current Outpatient Medications on File Prior to Encounter   Medication Sig Dispense Refill   ??? acetaminophen (TYLENOL) 500 MG tablet Take 500 mg by mouth Three (3) times a day as needed for pain.     ??? ARIPiprazole (ABILIFY) 5 MG tablet Take 5 mg by mouth daily.     ??? atorvastatin (LIPITOR) 80 MG tablet Take 1 tablet (80 mg total) by mouth every evening. 90 tablet 1   ??? blood sugar diagnostic (ACCU-CHEK GUIDE) Strp by Other route Four (4) times a day (before meals and nightly). Acuu-chek Guide. 120 each 5   ??? busPIRone (BUSPAR) 10 MG tablet Take 10 mg by mouth Two (2) times a day.      ??? calcium-vitamin D 250-100 mg-unit per tablet Take 1 tablet by mouth Two (2) times a day. 60 tablet 11   ??? clindamycin (CLEOCIN) 300 MG capsule Take 1 capsule (300 mg total) by mouth Four (4) times a day for 7 days. 28 capsule 0   ??? diclofenac sodium (VOLTAREN) 1 % gel Apply 2 g topically nightly.     ??? empty container Misc  Use as directed 1 each PRN   ??? FLUoxetine (PROZAC) 20 MG capsule Take 20 mg by mouth daily.      ??? folic acid (FOLVITE) 1 MG tablet Take 1 tablet (1 mg total) by mouth daily. 90 tablet 3   ??? insulin degludec (TRESIBA FLEXTOUCH U-200) 200 unit/mL (3 mL) InPn Use daily as directed by MD - start with 65 units daily, up to 100 units daily. 18 mL 4   ??? insulin lispro (HUMALOG U-100 INSULIN) 100 unit/mL injection INJECT 0.20ML (20 UNITS TOTAL) UNDER THE SKIN THREE TIMES A DAY BEFORE MEALS. 50 mL 1   ??? insulin syringes, disposable, 1 mL Syrg 100 Units by Miscellaneous route daily. Insulin injecting diabetes 250.00  Inject daily  100 unit syringe (1ml) 100 each 3   ??? levothyroxine (SYNTHROID) 88 MCG tablet TAKE 1 TABLET BY MOUTH EVERY DAY 90 tablet 1 ??? lisinopriL (PRINIVIL,ZESTRIL) 2.5 MG tablet Take 1 tablet (2.5 mg total) by mouth daily. 90 tablet 3   ??? LYRICA 150 mg capsule Take 150 mg by mouth Three (3) times a day.  0   ??? metFORMIN (GLUCOPHAGE) 500 MG tablet Take 2 tablets (1,000 mg total) by mouth 2 (two) times a day with meals. 360 tablet 3   ??? methotrexate 25 mg/mL injection solution Inject 1 mL (25 mg total) under the skin once a week. 10 mL 3   ??? nystatin (MYCOSTATIN) 100,000 unit/gram powder Apply 1 application topically 4 (four) times a day as needed.     ??? nystatin (MYCOSTATIN) 100,000 unit/mL suspension Take 5 mL (500,000 Units total) by mouth Four (4) times a day. 60 mL 0   ??? pramipexole (MIRAPEX) 0.125 MG tablet Take 1 tablet (0.125 mg total) by mouth nightly. Can increase to 2 tablets (0.250 mg) in one week if one tablet is not effective. 60 tablet 1   ??? predniSONE (DELTASONE) 5 MG tablet Take 20 mg daily x 2 weeks, 15 mg daily x 2 weeks, 10 mg daily x 2 weeks, then 5 mg until visit with rheumatology 182 tablet 1   ??? promethazine (PHENERGAN) 25 MG tablet Take 25 mg by mouth every six (6) hours as needed for nausea. Indications: nausea and vomiting after surgery     ??? QUEtiapine (SEROQUEL) 300 MG tablet Take 1 tablet (300 mg total) by mouth nightly. 90 tablet 3   ??? senna (SENOKOT) 8.6 mg tablet Take 2 tablets by mouth nightly as needed for constipation. 60 tablet 0   ??? solifenacin (VESICARE) 5 MG tablet Take 1 tablet (5 mg total) by mouth daily. 30 tablet 12   ??? tocilizumab (ACTEMRA ACTPEN) 162 mg/0.9 mL PnIj Inject the contents of 1 pen (162 mg) under the skin every seven (7) days. 10.8 mL 3   ??? zolpidem (AMBIEN) 5 MG tablet Take 5 mg by mouth nightly as needed for sleep.         Allergies:  Allergies   Allergen Reactions   ??? Cephalexin Hives   ??? Doxycycline Rash   ??? Sulfa (Sulfonamide Antibiotics) Hives   ??? Codeine Nausea And Vomiting   ??? Darvocet A500  [Propoxyphene N-Acetaminophen] Nausea Only   ??? Hydrocodone Nausea And Vomiting ??? Meropenem Rash     Erythematous, hot, pruritic rash over arms, chest, back, abdomen, and face occurred at the end of meropenem infusion on 02/22/18   ??? Sulfasalazine Palpitations     makes heart fly, she gets flushed and passes out  Family History:  Family History   Problem Relation Age of Onset   ??? Cancer Maternal Grandmother         Lung cancer   ??? Diabetes Maternal Grandmother    ??? Rheum arthritis Sister    ??? GU problems Neg Hx    ??? Kidney cancer Neg Hx    ??? Prostate cancer Neg Hx        Social History:   Social History     Tobacco Use   ??? Smoking status: Former Smoker     Packs/day: 2.00     Years: 27.00     Pack years: 54.00     Types: Cigarettes     Quit date: 2003     Years since quitting: 17.8   ??? Smokeless tobacco: Former Neurosurgeon     Quit date: 2003   ??? Tobacco comment: Started smoking at 54, quit 2003.    Substance Use Topics   ??? Alcohol use: Yes     Alcohol/week: 7.0 standard drinks     Types: 7 Cans of beer per week     Binge frequency: Less than monthly   ??? Drug use: No       Review of Systems  10 systems were reviewed and are negative except as noted specifically in the HPI.    Objective  Vitals:   Temp:  [36.5 ??C] 36.5 ??C  Heart Rate:  [81] 81  Resp:  [18] 18  BP: (143)/(104) 143/104  SpO2:  [99 %] 99 %    Intake/Output last 3 shifts:  No intake/output data recorded.    Physical Exam:   Constitutional: NAD  Eyes: PERRL, EOM intact, no scleral icterus or conjunctival erythema  Ears, nose, mouth and throat: Moist mucus membranes, no discharge  Neck: Supple, trachea midline, no gross masses  Respiratory: No increased WOB, Symmetrical chest rise, no audible wheeze or stridor  Cardiovascular: Extremities are warm and well perfused, no active bleeding  Gastrointestinal: Abdomen is large, non-distended, and minimally tender  Musculoskeletal: No gross limitations in ROM  Skin: Healing cellulitis to L abdomen.  Neurologic: Alert. No gross focal neurological deficits Psychiatric: Appropriate mood and affect    Most Recent Labs:  Lab Results   Component Value Date    WBC 5.7 02/11/2019    HGB 13.3 02/11/2019    HCT 40.5 02/11/2019    PLT 180 02/11/2019       Lab Results   Component Value Date    NA 143 02/11/2019    K 3.6 02/11/2019    CL 104 02/11/2019    CO2 27.0 02/11/2019    BUN 13 02/11/2019    CREATININE 1.78 (H) 02/11/2019    CALCIUM 9.9 02/11/2019    MG 1.5 (L) 02/05/2019    PHOS 4.2 02/05/2019       Imaging:  Ecg 12 Lead    Result Date: 02/11/2019  SINUS RHYTHM WITH PREMATURE SUPRAVENTRICULAR BEATS POSSIBLE INFERIOR INFARCT , AGE UNDETERMINED ABNORMAL ECG NO PREVIOUS ECGS AVAILABLE    Ct Abdomen Pelvis With Iv Contrast Only    Result Date: 02/11/2019 EXAM: CT ABDOMEN PELVIS W CONTRAST DATE: 02/11/2019 1:26 PM ACCESSION: [id number] UN DICTATED: 02/11/2019 1:27 PM INTERPRETATION LOCATION: [facility] CLINICAL INDICATION: Right sided abd pain ; RLQ abdominal pain, appendicitis suspected (Age => 15y)  COMPARISON: 02/03/2019 TECHNIQUE: A spiral CT scan of the abdomen and pelvis was obtained with IV contrast from the lung bases through the pubic symphysis. Images were reconstructed in the axial plane.  Coronal and sagittal reformatted images were also provided for further evaluation. FINDINGS: LINES AND TUBES: None. LOWER THORAX: Coronary vascular calcifications. Bibasilar subsegmental atelectasis. HEPATOBILIARY: No focal hepatic lesions. Cholecystectomy No biliary dilatation.  SPLEEN: Mild splenomegaly PANCREAS: Unremarkable. ADRENALS: Unremarkable. KIDNEYS/URETERS: Symmetric nephrograms. No hydronephrosis. BLADDER: Unremarkable. PELVIC/REPRODUCTIVE ORGANS: Status post hysterectomy. No adnexal mass. GI TRACT: Small sliding hiatal hernia. No dilated or thick walled loops of bowel. Normal appendix PERITONEUM/RETROPERITONEUM AND MESENTERY: No free air or fluid. LYMPH NODES: Interval decreased size of pelvic sidewall nodes now measuring up to 2.7 cm (2:28), previously 0.9 cm. Enlarged porta hepatis node measuring 1.6 cm (2:36), previously 1.3 cm. VESSELS: The aorta is normal in caliber.  No significant calcified atherosclerotic disease. The portal venous system is patent. The hepatic veins and IVC are unremarkable. BONES AND SOFT TISSUES: Fat-containing fluid containing a right paramedian hernia measuring 7.8 x 4.3 cm (6:95). The fluid component is new from most recent study. There is mild inflammatory stranding within the hernia sac. Midline postsurgical changes. Sacral spinal stimulator in place with Dr. Luisa Hart within the left gluteal soft tissues. -No evidence for appendicitis as clinically questioned. -Interval development of fluid within the right central hernia sac with mild inflammatory stranding. Findings may be reactive. However clinical assessment for reducibility/incarceration is recommended -Nonspecific mesenteric adenopathy. Attention on future examination.     ____________________  Sanjuana Kava, MD  General Surgery, PGY2

## 2019-02-11 NOTE — Unmapped (Signed)
Pt C/O R sided pain with nausea

## 2019-02-11 NOTE — Unmapped (Addendum)
[facility]  Emergency Department Provider Note        ED Clinical Impression      Final diagnoses:   Abdominal pain, unspecified abdominal location (Primary)   Nausea and vomiting, intractability of vomiting not specified, unspecified vomiting type   Elevated serum creatinine         Impression, ED Course, Assessment and Plan      10:57 AM    Impression: Right-sided abdominal pain, nausea, dry heaves, and diarrhea.    Patient has had 1 to 2 days of symptoms.  On exam she has some tenderness in right lower quadrant.  Overall she looks nontoxic.    Differential broad.  Given that she still has an appendix will obtain CT scan to evaluate for appendicitis as well as other intra-abdominal pathology.  Will obtain urinalysis to look for signs of infection.  Given that she has some pain to raise to her chest and shoulder will obtain EKG and troponin although feel that ACS is not likely cause.  Has no pain rating to her back.  Of course, also consider side effect from medication.    12:58 PM  Initial troponin negative. Glucose elevated to 280.    1:41 PM  UA significant for trace leukocyte esterase, 150 glucose, elevated WBC to 11, and rare bacteria and mucus.    1:52 PM  Given some inflammatory stranding and a new fluid filled sac near the hernia site we will asked surgery to see patient.    3:54 PM  Surgery does not believe patient requires emergent surgery.  They would like patient to see the weight loss clinic and follow-up with him as an outpatient to discuss surgery at a later date for hernia.    I think the patient's clindamycin may be contributing to her GI upset.  Will order C. difficile testing.  Patient has had this once before.    Discuss further with patient.  She is quite concerned about her ability to stay hydrated and tolerate p.o. intake.  Patient requests admission for IV fluids.  Patient does have a mild bump in her creatinine.  Will discuss with the medical admissions officer.    4:37 PM Discussed with the Med W admissions team who will come evaluate patient for admission.    Additional Medical Decision Making and Disclaimers     Amanda Hanson was evaluated in Emergency Department at the time of this visit for the symptoms described in the history of present illness. She was evaluated in the context of the global COVID-19 pandemic, which necessitated consideration that the patient might be at risk for infection with the SARS-CoV-2 virus that causes COVID-19. Institutional protocols and algorithms that pertain to the evaluation of patients at risk for COVID-19 were followed during the patient's care in the ED.    I have reviewed the vital signs and the nursing notes. Labs and radiology results that were available during my care of the patient were independently reviewed by me and considered in my medical decision making.     I directly visualized and independently interpreted the EKG tracing.   I independently visualized the radiology images.   I reviewed the patient's prior medical records.   Discussed with the surgery consultant team  Discussed with the admissions team.      Portions of this record have been created using Dragon dictation software. Dictation errors have been sought, but may not have been identified and corrected.  ____________________________________________  History        Chief Complaint  Nausea      HPI   Amanda Hanson is a 58 y.o. female with a history of heart failure, diabetes, multiple abdominal surgeries for hernia, and cholecystectomy presents to the emergency department complaining of 1 to 2 days of nausea, dry heaves, diarrhea, and right-sided abdominal pain.  She states most of the pain is in her right lower abdomen but also has some right upper abdomen pain that radiates to her chest and right shoulder.  No shortness of breath with it or fevers.  No known ill contacts. Patient is currently on clindamycin for cellulitis of her abdomen.  She states this is improving.      Past Medical History:   Diagnosis Date   ??? Abnormal mammogram    ??? Anxiety disorder    ??? Arthritis    ??? Congestive heart failure (CHF) (CMS-HCC)    ??? DDD (degenerative disc disease)    ??? Diabetes mellitus type 2 in obese (CMS-HCC) 2011   ??? GERD (gastroesophageal reflux disease)    ??? Hyperlipidemia    ??? Hypothyroidism    ??? Mixed incontinence urge and stress (female)(female)    ??? Obesity    ??? Other functional disorder of bladder    ??? Rheumatoid arthritis(714.0)    ??? Sleep apnea    ??? Urinary frequency    ??? Urinary obstruction, not elsewhere classified        Patient Active Problem List   Diagnosis   ??? Major depressive disorder, recurrent episode, moderate (CMS-HCC)   ??? Hypothyroidism   ??? Obesity   ??? Obstructive sleep apnea   ??? Rheumatoid arthritis flare (CMS-HCC)   ??? Overactive detrusor   ??? Mixed incontinence   ??? Hypokalemia due to loss of potassium   ??? Hypomagnesemia   ??? QT prolongation   ??? Type 2 diabetes mellitus with hyperglycemia (CMS-HCC)   ??? Anxiety   ??? GERD (gastroesophageal reflux disease)   ??? Neuropathy   ??? Abdominal wall hernia   ??? Adhesive capsulitis   ??? Mixed anxiety depressive disorder   ??? Bipolar disorder, unspecified (CMS-HCC)   ??? Chronic diastolic CHF (congestive heart failure), NYHA class 3 (CMS-HCC)   ??? COPD (chronic obstructive pulmonary disease) (CMS-HCC)   ??? Diastolic dysfunction   ??? Knee pain, bilateral   ??? Vitamin D deficiency   ??? Chronic pain of both wrists   ??? Chronic pain syndrome   ??? Chronic cystitis   ??? Morbid (severe) obesity due to excess calories (CMS-HCC)   ??? Diabetic ulcer of heel (CMS-HCC)   ??? Perineal abscess   ??? Cellulitis   ??? Diabetic ulcer of toe of right foot associated with type 2 diabetes mellitus (CMS-HCC)   ??? Ulcer of left heel and midfoot with fat layer exposed (CMS-HCC)   ??? Decubitus ulcer of heel, bilateral   ??? Osteomyelitis due to type 2 diabetes mellitus (CMS-HCC) ??? Subacute vulvitis   ??? MRSA (methicillin resistant Staphylococcus aureus) infection   ??? Cellulitis, abdominal wall       Past Surgical History:   Procedure Laterality Date   ??? BREAST BIOPSY Left 5 15 2017    with clip   ??? CHOLECYSTECTOMY     ??? Cystoscopy with Hydrodistention      11/2004   ??? HERNIA REPAIR     ??? HYSTERECTOMY  2000   ??? Interstim Placement      02/2005   ??? LAPAROSCOPIC GASTRIC BANDING     ???  OOPHORECTOMY Bilateral 2000   ??? PR AMPUTATION METATARSAL+TOE,SINGLE Right 08/24/2018    Procedure: PRIORITY 2nd ray amputation;  Surgeon: Karen Chafe, DPM;  Location: MAIN [facility];  Service: Vascular   ??? PR AMPUTATION TOE,MT-P JT Right 02/23/2018    Procedure: AMPUTATION, TOE; METATARSOPHALANGEAL JOINT;  Surgeon: Karen Chafe, DPM;  Location: MAIN [facility];  Service: Vascular   ??? PR AMPUTATION TOE,MT-P JT Right 08/02/2018    Procedure: AMPUTATION, TOE; METATARSOPHALANGEAL JOINT;  Surgeon: Webb Silversmith, MD;  Location: MAIN OR [facility];  Service: Vascular   ??? PR DEBRIDEMENT, SKIN, SUB-Q TISSUE,=<20 SQ CM Right 11/15/2017    Procedure: Debridement of right groin/perineal wound;  Surgeon: Joanie Coddington, MD;  Location: MAIN OR [facility];  Service: Trauma   ??? PR DEBRIDEMENT, SKIN, SUB-Q TISSUE,MUSCLE,BONE,=<20 SQ CM Right 02/28/2018    Procedure: DEBRIDEMENT; SKIN, SUBCUTANEOUS TISSUE, MUSCLE, & BONE FOOT;  Surgeon: Karen Chafe, DPM;  Location: MAIN OR [facility];  Service: Vascular   ??? PR DEBRIDEMENT, SKIN, SUB-Q TISSUE,MUSCLE,BONE,=<20 SQ CM Right 03/28/2018    Procedure: DEBRIDEMENT; SKIN, SUBCUTANEOUS TISSUE, MUSCLE, & BONE FOOT;  Surgeon: Karen Chafe, DPM;  Location: MAIN OR [facility];  Service: Vascular   ??? PR FULL EXCIS 1ST METATARSAL HEAD Right 03/28/2018    Procedure: OSTECTOMY COMPLT EXC; FIRST METATARSAL HEAD;  Surgeon: Karen Chafe, DPM;  Location: MA[facility];  Service: Vascular   ??? PR I&D OF VULVA/PERINEUM ABSCESS Left 12/13/2018 Procedure: Incision And Drainage Of Vulva Or Perineal Abscess;  Surgeon: Joanie Coddington, MD;  Location: MAIN OR [facility];  Service: Trauma   ??? PR I&D PERIANAL ABSCESS,SUPERFICIAL Left 12/13/2018    Procedure: INCISION AND DRAINAGE, PERIANAL ABSCESS, SUPERFICIAL;  Surgeon: Joanie Coddington, MD;  Location: MAIN OR [facility];  Service: Trauma   ??? PR INCIS/DRAIN THIGH/KNEE ABSCESS,DEEP Left 06/14/2018    Procedure: Deep thigh abscess drainage;  Surgeon: Suella Broad, MD;  Location: MAIN OR [facility];  Service: Trauma   ??? PR SECD CLOS SURG WND EXTEN/COMPLIC Right 02/28/2018    Procedure: SECONDARY CLOSURE OF SURGICAL WOUND OR DEHISCENCE, EXTENSIVE OR COMPLICATED;  Surgeon: Karen Chafe, DPM;  Location: MAIN OR [facility];  Service: Vascular       No current facility-administered medications for this encounter.     Current Outpatient Medications:   ???  acetaminophen (TYLENOL) 500 MG tablet, Take 500 mg by mouth Three (3) times a day as needed for pain., Disp: , Rfl:   ???  ARIPiprazole (ABILIFY) 5 MG tablet, Take 5 mg by mouth daily., Disp: , Rfl:   ???  atorvastatin (LIPITOR) 80 MG tablet, Take 1 tablet (80 mg total) by mouth every evening., Disp: 90 tablet, Rfl: 1  ???  blood sugar diagnostic (ACCU-CHEK GUIDE) Strp, by Other route Four (4) times a day (before meals and nightly). Acuu-chek Guide., Disp: 120 each, Rfl: 5  ???  busPIRone (BUSPAR) 10 MG tablet, Take 10 mg by mouth Two (2) times a day. , Disp: , Rfl:   ???  calcium-vitamin D 250-100 mg-unit per tablet, Take 1 tablet by mouth Two (2) times a day., Disp: 60 tablet, Rfl: 11  ???  clindamycin (CLEOCIN) 300 MG capsule, Take 1 capsule (300 mg total) by mouth Four (4) times a day for 7 days., Disp: 28 capsule, Rfl: 0  ???  diclofenac sodium (VOLTAREN) 1 % gel, Apply 2 g topically nightly., Disp: , Rfl:   ???  empty container Misc, Use as directed, Disp:  1 each, Rfl: PRN  ???  FLUoxetine (PROZAC) 20 MG capsule, Take 20 mg by mouth daily. , Disp: , Rfl: ???  folic acid (FOLVITE) 1 MG tablet, Take 1 tablet (1 mg total) by mouth daily., Disp: 90 tablet, Rfl: 3  ???  insulin degludec (TRESIBA FLEXTOUCH U-200) 200 unit/mL (3 mL) InPn, Use daily as directed by MD - start with 65 units daily, up to 100 units daily., Disp: 18 mL, Rfl: 4  ???  insulin lispro (HUMALOG U-100 INSULIN) 100 unit/mL injection, INJECT 0.20ML (20 UNITS TOTAL) UNDER THE SKIN THREE TIMES A DAY BEFORE MEALS., Disp: 50 mL, Rfl: 1  ???  insulin syringes, disposable, 1 mL Syrg, 100 Units by Miscellaneous route daily. Insulin injecting diabetes 250.00 Inject daily 100 unit syringe (1ml), Disp: 100 each, Rfl: 3  ???  levothyroxine (SYNTHROID) 88 MCG tablet, TAKE 1 TABLET BY MOUTH EVERY DAY, Disp: 90 tablet, Rfl: 1  ???  lisinopriL (PRINIVIL,ZESTRIL) 2.5 MG tablet, Take 1 tablet (2.5 mg total) by mouth daily., Disp: 90 tablet, Rfl: 3  ???  LYRICA 150 mg capsule, Take 150 mg by mouth Three (3) times a day., Disp: , Rfl: 0  ???  metFORMIN (GLUCOPHAGE) 500 MG tablet, Take 2 tablets (1,000 mg total) by mouth 2 (two) times a day with meals., Disp: 360 tablet, Rfl: 3  ???  methotrexate 25 mg/mL injection solution, Inject 1 mL (25 mg total) under the skin once a week., Disp: 10 mL, Rfl: 3  ???  nystatin (MYCOSTATIN) 100,000 unit/gram powder, Apply 1 application topically 4 (four) times a day as needed., Disp: , Rfl:   ???  nystatin (MYCOSTATIN) 100,000 unit/mL suspension, Take 5 mL (500,000 Units total) by mouth Four (4) times a day., Disp: 60 mL, Rfl: 0  ???  pramipexole (MIRAPEX) 0.125 MG tablet, Take 1 tablet (0.125 mg total) by mouth nightly. Can increase to 2 tablets (0.250 mg) in one week if one tablet is not effective., Disp: 60 tablet, Rfl: 1  ???  predniSONE (DELTASONE) 5 MG tablet, Take 20 mg daily x 2 weeks, 15 mg daily x 2 weeks, 10 mg daily x 2 weeks, then 5 mg until visit with rheumatology, Disp: 182 tablet, Rfl: 1 ???  promethazine (PHENERGAN) 25 MG tablet, Take 25 mg by mouth every six (6) hours as needed for nausea. Indications: nausea and vomiting after surgery, Disp: , Rfl:   ???  QUEtiapine (SEROQUEL) 300 MG tablet, Take 1 tablet (300 mg total) by mouth nightly., Disp: 90 tablet, Rfl: 3  ???  senna (SENOKOT) 8.6 mg tablet, Take 2 tablets by mouth nightly as needed for constipation., Disp: 60 tablet, Rfl: 0  ???  solifenacin (VESICARE) 5 MG tablet, Take 1 tablet (5 mg total) by mouth daily., Disp: 30 tablet, Rfl: 12  ???  tocilizumab (ACTEMRA ACTPEN) 162 mg/0.9 mL PnIj, Inject the contents of 1 pen (162 mg) under the skin every seven (7) days., Disp: 10.8 mL, Rfl: 3  ???  zolpidem (AMBIEN) 5 MG tablet, Take 5 mg by mouth nightly as needed for sleep., Disp: , Rfl:     Allergies  Cephalexin, Doxycycline, Sulfa (sulfonamide antibiotics), Codeine, Darvocet a500  [propoxyphene n-acetaminophen], Hydrocodone, Meropenem, and Sulfasalazine    Family History   Problem Relation Age of Onset   ??? Cancer Maternal Grandmother         Lung cancer   ??? Diabetes Maternal Grandmother    ??? Rheum arthritis Sister    ??? GU problems Neg Hx    ???  Kidney cancer Neg Hx    ??? Prostate cancer Neg Hx        Social History  Social History     Tobacco Use   ??? Smoking status: Former Smoker     Packs/day: 2.00     Years: 27.00     Pack years: 54.00     Types: Cigarettes     Quit date: 2003     Years since quitting: 17.8   ??? Smokeless tobacco: Former Neurosurgeon     Quit date: 2003   ??? Tobacco comment: Started smoking at 48, quit 2003.    Substance Use Topics   ??? Alcohol use: Yes     Alcohol/week: 7.0 standard drinks     Types: 7 Cans of beer per week     Binge frequency: Less than monthly   ??? Drug use: No       Review of Systems   Constitutional: Negative for fever.  Cardiovascular: Negative for chest pain.  Respiratory: Negative for shortness of breath  Gastrointestinal: See HPI.  Genitourinary: Negative for dysuria.  Negative for hematuria. Musculoskeletal: Negative for back pain.  Skin: See HPI.  See HPI.  10 point review of systems otherwise negative.     Physical Exam     This provider entered the patient's room: Yes:    ? If this provider did not enter the room, a comprehensive physical exam was not able to be performed due to increased infection risk to themselves, other providers, staff and other patients), as well as to conserve personal protective equipment (PPE) utilization during the COVID-19 pandemic.    ? If this provider did enter the patient room, the following was PPE worn: Surgical mask, eye protection and gloves      ED Triage Vitals   Enc Vitals Group      BP 02/11/19 1034 143/104      Heart Rate 02/11/19 1034 81      SpO2 Pulse --       Resp 02/11/19 1034 18      Temp 02/11/19 1033 36.5 ??C (97.7 ??F)      Temp src --       SpO2 02/11/19 1034 99 %     Constitutional: Alert and oriented. Well appearing and in no distress.  Eyes: Conjunctivae are normal.  ENT       Head: Normocephalic and atraumatic.       Nose: No rhinorrhea.       Mouth/Throat: Mucous membranes appear moist.       Neck: No audible stridor.  Hematological/Lymphatic/Immunilogical: No cervical lymphadenopathy.  Cardiovascular: Blood pressure and pulse rate as documented above. Normal rate, regular rhythm. Normal and symmetric distal pulses are present in all extremities.  Respiratory: Breath sounds are normal. Normal respiratory pattern and effort. No audible wheezing or other abnormal breath sounds. Speaking easily in full sentences. No cough noted during my evaluation of the patient.   Gastrointestinal: Right lower quadrant tenderness.  No rebound tenderness.  There is no CVA tenderness.  Musculoskeletal: Normal range of motion in all extremities.       Right lower leg: No tenderness or edema.       Left lower leg: No tenderness or edema.  Neurologic: Normal speech and language. No gross focal neurologic deficits are appreciated. Skin: Rash underneath pannus with erythema.  Powder on rash noted.  Psychiatric: Mood and affect are normal. Speech and behavior are normal.       EKG  1130- NSR at 71.  QTc is 445.  No ST or T wave abnormalities.  EKG somewhat limited by artifact.  When compared to 02/05/2019 there are no significant changes.     Radiology     CT Abdomen Pelvis with IV Contrast ONLY   Preliminary Result   -No evidence for appendicitis as clinically questioned.      -Interval development of fluid within the right central hernia sac with mild inflammatory stranding. Findings may be reactive. However clinical assessment for reducibility/incarceration is recommended      -Nonspecific mesenteric adenopathy. Attention on future examination.                   Documentation assistance was provided by Mliss Sax, Scribe, on February 11, 2019 at 12:57 PM for Cheri Rous, NP.    February 11, 2019 4:38 PM. Documentation assistance provided by the scribe. I was present during the time the encounter was recorded. The information recorded by the scribe was done at my direction and has been reviewed and validated by me.          Junious Dresser [place], FNP  02/11/19 [address] [place], [place]  02/11/19 [phone]

## 2019-02-12 DIAGNOSIS — R109 Unspecified abdominal pain: Secondary | ICD-10-CM | POA: Insufficient documentation

## 2019-02-12 DIAGNOSIS — N179 Acute kidney failure, unspecified: Secondary | ICD-10-CM | POA: Insufficient documentation

## 2019-02-12 HISTORY — DX: Unspecified abdominal pain: R10.9

## 2019-02-12 LAB — CBC
HEMATOCRIT: 34.1 % — ABNORMAL LOW (ref 36.0–46.0)
HEMOGLOBIN: 11.1 g/dL — ABNORMAL LOW (ref 12.0–16.0)
MEAN CORPUSCULAR HEMOGLOBIN CONC: 32.6 g/dL (ref 31.0–37.0)
MEAN CORPUSCULAR HEMOGLOBIN: 26.9 pg (ref 26.0–34.0)
MEAN PLATELET VOLUME: 8 fL (ref 7.0–10.0)
PLATELET COUNT: 156 10*9/L (ref 150–440)
RED CELL DISTRIBUTION WIDTH: 15.6 % — ABNORMAL HIGH (ref 12.0–15.0)

## 2019-02-12 LAB — BASIC METABOLIC PANEL
ANION GAP: 10 mmol/L (ref 7–15)
BLOOD UREA NITROGEN: 12 mg/dL (ref 7–21)
BUN / CREAT RATIO: 7
CALCIUM: 8.9 mg/dL (ref 8.5–10.2)
CHLORIDE: 105 mmol/L (ref 98–107)
CO2: 27 mmol/L (ref 22.0–30.0)
EGFR CKD-EPI AA FEMALE: 37 mL/min/{1.73_m2} — ABNORMAL LOW (ref >=60)
EGFR CKD-EPI NON-AA FEMALE: 32 mL/min/{1.73_m2} — ABNORMAL LOW (ref >=60)
GLUCOSE RANDOM: 166 mg/dL (ref 70–179)
POTASSIUM: 3.3 mmol/L — ABNORMAL LOW (ref 3.5–5.0)

## 2019-02-12 LAB — BUN / CREAT RATIO: Urea nitrogen/Creatinine:MRto:Pt:Ser/Plas:Qn:: 7

## 2019-02-12 LAB — HEMATOCRIT: Hematocrit:VFr:Pt:Bld:Qn:: 34.1 — ABNORMAL LOW

## 2019-02-12 LAB — MAGNESIUM: Magnesium:MCnc:Pt:Ser/Plas:Qn:: 1.1 — ABNORMAL LOW
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
## 2019-02-12 NOTE — Unmapped (Signed)
Pt. Complaining of abdominal pain at the start of the shift. Oxycodone administered as ordered and was effective. Pt. Complaining of nausea at the beginning of the shift. IV zofran administered as ordered and was effective. All medications refused due to nausea except pain medication. IV fluid bolus administered as ordered. Still need stool sample to test for c-diff. Blood glucose levels elevated. Sliding scale and long acting insulins administered as ordered. Skin under bilateral breasts is reddened and irritated. Possible yeast infection. Will try antifungal powder and intra dry to treat skin irritation.   Problem: Adult Inpatient Plan of Care  Goal: Plan of Care Review  Outcome: Progressing  Goal: Patient-Specific Goal (Individualization)  Outcome: Progressing  Flowsheets (Taken 02/12/2019 0327)  Individualized Care Needs: Pain control, nausea control, IV fluids, monitor labs/VS, fall precautions.  Anxieties, Fears or Concerns: Pt. concerned about pain and nausea.  Goal: Absence of Hospital-Acquired Illness or Injury  Outcome: Progressing  Intervention: Identify and Manage Fall Risk  Flowsheets (Taken 02/12/2019 0327)  Safety Interventions: fall reduction program maintained  Intervention: Prevent Skin Injury  Flowsheets (Taken 02/12/2019 0327)  Pressure Reduction Techniques: frequent weight shift encouraged  Intervention: Prevent VTE (venous thromboembolism)  Flowsheets (Taken 02/12/2019 0327)  VTE Prevention/Management:   anticoagulation therapy   ambulation promoted  Intervention: Prevent Infection  Flowsheets (Taken 02/12/2019 0327)  Infection Prevention:   handwashing promoted   rest/sleep promoted   single patient room provided   visitors restricted/screened  Goal: Optimal Comfort and Wellbeing  Outcome: Progressing  Intervention: Monitor Pain and Promote Comfort  Flowsheets (Taken 02/12/2019 0327)  Pain Management Interventions:   care clustered   pain management plan reviewed with patient/caregiver quiet environment facilitated  Intervention: Provide Person-Centered Care  Flowsheets (Taken 02/12/2019 0327)  Trust Relationship/Rapport:   care explained   questions encouraged   choices provided   reassurance provided   emotional support provided   thoughts/feelings acknowledged   empathic listening provided   questions answered  Goal: Readiness for Transition of Care  Outcome: Progressing  Goal: Rounds/Family Conference  Outcome: Progressing     Problem: Infection  Goal: Infection Symptom Resolution  Outcome: Progressing  Intervention: Prevent or Manage Infection  Flowsheets (Taken 02/12/2019 0327)  Infection Management: aseptic technique maintained  Fever Reduction/Comfort Measures: medication administered  Isolation Precautions:   contact precautions maintained   enteric precautions maintained

## 2019-02-12 NOTE — Unmapped (Signed)
Adult Nutrition Assessment Note    Visit Type: RN Consult  Reason for Visit: Per Admission Nutrition Screen (Adult), Have you gained or lost 10 pounds in the past 3 months?, Have you had a decrease in food intake or appetite?    This patient was not seen in person. The clinical nutrition service has moved to a liaison model to minimize potential spread of COVID-19, protect patients/providers and reduce PPE utilization.  During this time, we will be limiting person-to-person contact when possible.    ASSESSMENT:   HPI & PMH: 42F with a history of heart failure, diabetes, multiple abdominal surgeries for hernia, and cholecystectomy presents to the emergency department complaining of a 5-7 days of nausea, dry heaves, diarrhea, and right-sided abdominal pain; no acute intraabdominal processes on CT.   Nutrition Hx: Pending unable to reach patient by phone today. Per EMR weight is down 16 lb over the past 4 months (4.7 % not significant for time frame)  Nutritionally Pertinent Meds: folic acid ; PO KCl replacements ; deltasone ; 2 g IV Mg replacements   Labs: K 3.3 ; Mg 1.1 ; reviewed   Abd/GI: distended /rounded/tender BM 10/24 per chart review  Skin:      Active Wounds     Name:   Placement date:   Placement time:   Site:   Days:    Surgical Site 12/13/18 Perineum   12/13/18    1221     61    Surgical Site 02/04/19 Abdomen Left;Lower   02/04/19    2015     7    Wound 09/20/18 Foot Right Right foot    09/20/18    1010    Foot   145               Current nutrition therapy order:   Nutrition Orders          Nutrition Therapy Regular/House starting at 10/25 1817           Anthropometric Data:  -- Height: 167.6 cm (5' 6)   -- Last recorded weight: (!) 146.3 kg (322 lb 9.6 oz)  -- Admission weight: (!) 146.3 kg (322 lb 9.6 oz)  -- IBW: 59.01 kg  -- Percent IBW: 247.97 %  -- BMI: Body mass index is 52.07 kg/m??.   -- Weight changes this admission:   Last 5 Recorded Weights    02/11/19 1856 Weight: (!) 146.3 kg (322 lb 9.6 oz)      -- Weight history PTA:   Wt Readings from Last 10 Encounters:   02/11/19 (!) 146.3 kg (322 lb 9.6 oz)   02/08/19 (!) 145.2 kg (320 lb)   02/04/19 (!) 148.6 kg (327 lb 9.7 oz)   02/01/19 (!) 152 kg (335 lb)   01/19/19 (!) 147.9 kg (326 lb)   12/15/18 (!) 148.8 kg (328 lb)   11/17/18 (!) 148.8 kg (328 lb)   11/07/18 (!) 148.9 kg (328 lb 4.8 oz)   11/01/18 (!) 150.1 kg (331 lb)   10/04/18 (!) 153.3 kg (338 lb)        Daily Estimated Nutrient Needs:    Energy: 2067 kcals [Per [place] St-Jeor Equation using admission body weight, 146 kg (02/12/19 1525)]  Protein: 100-125 gm [(20-25% of kcal) using admission body weight, 146 kg (02/12/19 1525)]  Carbohydrate:   [45-60% of kcal]  Fluid:   [1 mL/kcal (maintenance)]     Nutrition Focused Physical Exam:    Pending RD working remotely today  DIAGNOSIS:  Malnutrition Assessment using AND/ASPEN Clinical Characteristics:    Pending NFPE and nutrition history    Overall nutrition impression:    - Predicted suboptimal energy intake related to nausea/vomiting as evidenced by chart review     GOALS:  Oral Intake:       - Patient to consume 75% of 3-4 small meals per day.  - Patient to consume 75% of 1-2 oral supplements per day.     RECOMMENDATIONS AND INTERVENTIONS:  Recommend continue current nutrition therapy   Encourage small/frequent meals and snacks as tolerated  Offer Ensure of preference prn  Patient is at moderate refeeding risk. Replete electrolytes aggressively     RD Follow Up Parameters:  1-2 times per week (and more frequent as indicated)     Gladys Damme MS, RD, LD, CNSC  [phone]

## 2019-02-13 ENCOUNTER — Encounter: Payer: Self-pay | Admitting: Pain Medicine

## 2019-02-13 LAB — BASIC METABOLIC PANEL
ANION GAP: 5 mmol/L — ABNORMAL LOW (ref 7–15)
ANION GAP: 4 mmol/L — ABNORMAL LOW (ref 7–15)
BLOOD UREA NITROGEN: 16 mg/dL (ref 7–21)
BLOOD UREA NITROGEN: 19 mg/dL (ref 7–21)
BUN / CREAT RATIO: 8
BUN / CREAT RATIO: 10
CALCIUM: 8.6 mg/dL (ref 8.5–10.2)
CALCIUM: 8.5 mg/dL (ref 8.5–10.2)
CO2: 30 mmol/L (ref 22.0–30.0)
CO2: 26 mmol/L (ref 22.0–30.0)
CREATININE: 1.93 mg/dL — ABNORMAL HIGH (ref 0.60–1.00)
CREATININE: 1.87 mg/dL — ABNORMAL HIGH (ref 0.60–1.00)
EGFR CKD-EPI AA FEMALE: 33 mL/min/{1.73_m2} — ABNORMAL LOW (ref >=60)
EGFR CKD-EPI AA FEMALE: 34 mL/min/{1.73_m2} — ABNORMAL LOW (ref >=60)
EGFR CKD-EPI NON-AA FEMALE: 28 mL/min/{1.73_m2} — ABNORMAL LOW (ref >=60)
EGFR CKD-EPI NON-AA FEMALE: 29 mL/min/{1.73_m2} — ABNORMAL LOW (ref >=60)
GLUCOSE RANDOM: 152 mg/dL (ref 70–179)
GLUCOSE RANDOM: 299 mg/dL — ABNORMAL HIGH (ref 70–179)
POTASSIUM: 4.5 mmol/L (ref 3.5–5.0)

## 2019-02-13 LAB — CBC
HEMATOCRIT: 33.4 % — ABNORMAL LOW (ref 36.0–46.0)
HEMOGLOBIN: 10.9 g/dL — ABNORMAL LOW (ref 12.0–16.0)
MEAN CORPUSCULAR HEMOGLOBIN CONC: 32.5 g/dL (ref 31.0–37.0)
MEAN CORPUSCULAR HEMOGLOBIN: 27.1 pg (ref 26.0–34.0)
MEAN CORPUSCULAR VOLUME: 83.2 fL (ref 80.0–100.0)
MEAN PLATELET VOLUME: 7.7 fL (ref 7.0–10.0)
PLATELET COUNT: 174 10*9/L (ref 150–440)
RED CELL DISTRIBUTION WIDTH: 15.7 % — ABNORMAL HIGH (ref 12.0–15.0)
WBC ADJUSTED: 4.4 10*9/L — ABNORMAL LOW (ref 4.5–11.0)

## 2019-02-13 LAB — OSMOLALITY URINE: Lab: 292

## 2019-02-13 LAB — MAGNESIUM: Magnesium:MCnc:Pt:Ser/Plas:Qn:: 1.6

## 2019-02-13 LAB — MEAN PLATELET VOLUME: Lab: 7.7

## 2019-02-13 LAB — BLOOD UREA NITROGEN: Urea nitrogen:MCnc:Pt:Ser/Plas:Qn:: 16

## 2019-02-13 LAB — CHLORIDE: Chloride:SCnc:Pt:Ser/Plas:Qn:: 105

## 2019-02-13 LAB — CREATININE, URINE: Lab: 60.8

## 2019-02-13 LAB — SODIUM URINE: Lab: 62

## 2019-02-13 MED ORDER — OXYCODONE 5 MG TABLET
ORAL_TABLET | ORAL | 0 refills | 2.00000 days | Status: CP | PRN
Start: 2019-02-13 — End: ?

## 2019-02-13 NOTE — Unmapped (Signed)
Internal Medicine (MDW) Progress Note    Assessment & Plan:     Principal Problem:    Abdominal pain  Active Problems:    Nausea & vomiting    AKI (acute kidney injury) (CMS-HCC)    Diarrhea  Resolved Problems:    * No resolved hospital problems. *      19F with a history of heart failure, diabetes, multiple abdominal surgeries for hernia, and cholecystectomy presents to the emergency department complaining of a 5-7 days of nausea, dry heaves, diarrhea, and right-sided abdominal pain; no acute intraabdominal processes on CT.   ??  RLQ/RUQ and chest pain  Patient reports RLQ pain that began a few days after being discharged from the hospital on October 19.  She described it as being sharp, with radiation to her right upper quadrant with a heaviness on her chest.  She had an EKG performed in the ER which demonstrated no significant change from prior, and a negative troponin.  She reports the pain lasts upwards of an hour or longer, then she is able to experience a few hours without pain.  Patient is already status post cholecystectomy, ruling out gallbladder contributions. There are no outer/superficial stigmata of infection. CT Abdomen showed no evidence of appendicitis. A small hernia was acknowledge, though stable from prior; Gen Surg was consulted???no indications for surgery. CT did identify some pelvic lymphadenopathy, also unchanged from prior. Given patient's poor glucose control, potentially some aspect of gastroparesis, though at baseline, patient does not seem to have issues with early satiety/bloating/nausea.   -- Patient without bowel movement since admission, C.diff test pending  -- s/p IV bolus (1.5 L in total)  -- Scheduled Tylenol   -- PRN oxycodone 5mg  q4hrs  ??  Nausea\vomiting  Patient reports ongoing nausea and vomiting since hospital discharge on 10/19.   -- Phenergan 25mg  Q6hrs   -- IV Zofran 4 mg Q4hrs  -- Encouraging small, stead intake today; bland food (toast, crackers)  ??  Diarrhea Patient reports diarrhea began after taking clindamycin for LLQ cellulitis/abscess. She has a history diarrhea and thrush associated with prior  Clindamycin use. Also with history of prior C.diff infection, last approximately 1 year ago. Potential gastroenteritis.   -- C.diff PCR    AKI  Creatinine elevated to 1.78 on admission from a baseline of around 0.7. Likely pre-renal in the setting of diarrhea as well as nausea/vomiting; no significant electrolyte derangements. Cr demonstrates little movement with boluses received this far???1.74.    -- Additional 1 L bolus LR  -- Recheck Cr in AM  ??  Hx of LLQ Cellulitis  Patient discharged home with 7 day course of clindamycin; did not begin taking medication immediately, but since has been taking medication as prescribed. Reports improvement in appearance. No further evidence of infection.    -- Clindamycin discontinued  -- CTM clinically  ??  Rheumatoid arthritis  Patient on methotrexate, prednisone taper and was about to begin tocilizumab but has not received dose.??Currently on Pred 5 mg daily from taper. Methotrexate taken 10/23.   --Continued prednisone 5 mg daily  ??  Type 2 Diabetes Mellitus  --Reduced home insulin lantus to 35 U nightly given poor nutritional status over acute illness  --Continued 16 U of Lispro with meals TID  --SSI  -- Diabetic educator  ??  Hypothyroidism  --Continued home levothyroxine 88 mcg daily  ??  MDD/bipolar disorder  --Continued home Abilify 5 mg daily  --Continued home BuSpar 10 mg twice daily  --Continued home  Prozac 20 mg daily  -- Continued home Seroquel 300 mg at bedtime     Daily Checklist:  Diet: Regular Diet  DVT PPx: Lovenox 40mg  q24h  Electrolytes: Replete Potassium to >/=4 and Magnesium to >/=2  Code Status: Full Code  Dispo: Med W, floor     Subjective: NAEON. Patient reports continue nausea with small volume emesis (water). No food intake; continued abdominal pain. Willing to try small amounts of dry/bland food today. Has not been able to produce a bowel movement.     Objective:     Temp:  [36.1 ??C-36.8 ??C] 36.8 ??C  Heart Rate:  [70-75] 75  Resp:  [18] 18  BP: (133-136)/(55-85) 133/55  SpO2:  [96 %-98 %] 98 %,   Intake/Output Summary (Last 24 hours) at 02/13/2019 0457  Last data filed at 02/12/2019 2001  Gross per 24 hour   Intake 240 ml   Output ???   Net 240 ml   , Body mass index is 52.07 kg/m??.,   Wt Readings from Last 3 Encounters:   02/11/19 (!) 146.3 kg (322 lb 9.6 oz)   02/08/19 (!) 145.2 kg (320 lb)   02/04/19 (!) 148.6 kg (327 lb 9.7 oz)       Gen: 58 yo morbidly obese woman, resting comfortable in bed, in NAD. Alert, oriented, answers questions appropriately.  HEENT: Atraumatic, normocephalic, MMM  Heart: RRR, S1, S2, no M/R/G  Lungs: CTAB, no crackles or wheezes, no use of accessory muscles  Abdomen: (Limited by body habitus) Hypoactive bowel sounds, soft, mild to moderate tenderness on R upper and lower quadrants/starting midline.   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   Psych: Normal mood and affect.     Labs/Studies: Labs and Studies from the last 24hrs per EMR and Reviewed

## 2019-02-13 NOTE — Unmapped (Signed)
Pt admitted with abd pain and n/v. Patient is A&O x 4. VSS, afebrile.   Complained  of nausea after eating 1/2 philly steak sandwich, managed with antiemetics.   Pain managed with PRN medications     No falls noted this shift. Refused lovenox, stated she was up walking in room  Tolerating po fluids throughout the shift. Continues resting in bed, call bell within reach, bed in lowest position. Will continue to monitor.   Problem: Adult Inpatient Plan of Care  Goal: Plan of Care Review  Outcome: Progressing  Goal: Patient-Specific Goal (Individualization)  Outcome: Progressing  Flowsheets (Taken 02/13/2019 0019)  Patient-Specific Goals (Include Timeframe): Patient will tolerate PO medicatins and diet by the end of shift.  Individualized Care Needs: Nausea meds, pain control, better choices with food, increase diet  Anxieties, Fears or Concerns: Pt still concerned over amount of nausea she has  Goal: Absence of Hospital-Acquired Illness or Injury  Outcome: Progressing  Goal: Optimal Comfort and Wellbeing  Outcome: Progressing  Goal: Readiness for Transition of Care  Outcome: Progressing  Goal: Rounds/Family Conference  Outcome: Progressing     Problem: Infection  Goal: Infection Symptom Resolution  Outcome: Progressing
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
## 2019-02-13 NOTE — Unmapped (Signed)
Patient is A&O x 4. VSS, afebrile. Able to communicate needs well with staff. Complaints of nausea throughout the shift, managed with antiemetics. Able to tolerate PO medications by end of shift. Refused many PO AM medications. MD notified.  Pain managed with PRN medications (see MAR). IV intact, flushed, and saline locked. Received IV magnesium and IV LR bolus today.  Patient remained in bed, but demonstrates good bed mobility. No falls noted this shift. Continues resting in bed, call bell within reach, bed in lowest position. Will continue to monitor.     Problem: Adult Inpatient Plan of Care  Goal: Plan of Care Review  Outcome: Ongoing - Unchanged  Flowsheets (Taken 02/12/2019 1806)  Progress: improving  Plan of Care Reviewed With: patient  Goal: Patient-Specific Goal (Individualization)  Outcome: Ongoing - Unchanged  Flowsheets (Taken 02/12/2019 1806)  Patient-Specific Goals (Include Timeframe): Patient will tolerate PO medication by the end of shift.  Individualized Care Needs: Nausea control, pain control. Medication tolerance  Anxieties, Fears or Concerns: Patient concerned about her nausea  Goal: Absence of Hospital-Acquired Illness or Injury  Outcome: Ongoing - Unchanged  Intervention: Identify and Manage Fall Risk  Flowsheets (Taken 02/12/2019 1806)  Safety Interventions:   environmental modification   fall reduction program maintained  Intervention: Prevent Skin Injury  Flowsheets (Taken 02/12/2019 1806)  Pressure Reduction Techniques: frequent weight shift encouraged  Intervention: Prevent VTE (venous thromboembolism)  Flowsheets (Taken 02/12/2019 1806)  VTE Prevention/Management:   fluids promoted   intravenous hydration  Intervention: Prevent Infection  Flowsheets (Taken 02/12/2019 1806)  Infection Prevention: rest/sleep promoted  Goal: Optimal Comfort and Wellbeing  Outcome: Ongoing - Unchanged  Intervention: Monitor Pain and Promote Comfort  Flowsheets (Taken 02/12/2019 1806) Pain Management Interventions: care clustered  Intervention: Provide Person-Centered Care  Flowsheets (Taken 02/12/2019 1806)  Trust Relationship/Rapport:   care explained   questions answered   questions encouraged  Goal: Readiness for Transition of Care  Outcome: Ongoing - Unchanged  Intervention: Mutually Develop Transition Plan  Flowsheets (Taken 02/12/2019 1806)  Concerns to be Addressed: no discharge needs identified  Goal: Rounds/Family Conference  Outcome: Ongoing - Unchanged  Flowsheets (Taken 02/12/2019 1806)  Participants:   case manager   patient   physical therapy   pharmacy   nursing   pastoral care     Problem: Infection  Goal: Infection Symptom Resolution  Outcome: Ongoing - Unchanged  Intervention: Prevent or Manage Infection  Flowsheets (Taken 02/12/2019 1806)  Infection Management: aseptic technique maintained  Isolation Precautions:   enteric precautions maintained   contact precautions maintained     Problem: Wound  Goal: Optimal Wound Healing  Outcome: Ongoing - Unchanged  Intervention: Promote Effective Wound Healing  Flowsheets (Taken 02/12/2019 1806)  Pain Management Interventions: care clustered

## 2019-02-14 ENCOUNTER — Other Ambulatory Visit: Payer: Self-pay

## 2019-02-14 ENCOUNTER — Ambulatory Visit: Payer: Medicaid Other | Attending: Pain Medicine | Admitting: Pain Medicine

## 2019-02-14 DIAGNOSIS — M25531 Pain in right wrist: Secondary | ICD-10-CM | POA: Diagnosis not present

## 2019-02-14 DIAGNOSIS — M25532 Pain in left wrist: Secondary | ICD-10-CM

## 2019-02-14 DIAGNOSIS — E559 Vitamin D deficiency, unspecified: Secondary | ICD-10-CM

## 2019-02-14 DIAGNOSIS — E1142 Type 2 diabetes mellitus with diabetic polyneuropathy: Secondary | ICD-10-CM

## 2019-02-14 DIAGNOSIS — M25529 Pain in unspecified elbow: Secondary | ICD-10-CM

## 2019-02-14 DIAGNOSIS — M792 Neuralgia and neuritis, unspecified: Secondary | ICD-10-CM

## 2019-02-14 DIAGNOSIS — G894 Chronic pain syndrome: Secondary | ICD-10-CM

## 2019-02-14 DIAGNOSIS — M79673 Pain in unspecified foot: Secondary | ICD-10-CM

## 2019-02-14 DIAGNOSIS — G8929 Other chronic pain: Secondary | ICD-10-CM

## 2019-02-14 MED ORDER — OXYCODONE HCL 5 MG PO TABS
5.00 | ORAL_TABLET | ORAL | Status: DC
Start: ? — End: 2019-02-14

## 2019-02-14 MED ORDER — MEDI-TUSSIN DM DOUBLE STRENGTH 30-200 MG/5ML PO LIQD
1000.00 | ORAL | Status: DC
Start: 2019-02-13 — End: 2019-02-14

## 2019-02-14 MED ORDER — PROMETHAZINE HCL 25 MG PO TABS
25.00 | ORAL_TABLET | ORAL | Status: DC
Start: ? — End: 2019-02-14

## 2019-02-14 MED ORDER — OXYCODONE HCL 5 MG PO TABS: 5.0000 mg | ORAL_TABLET | Freq: Four times a day (QID) | ORAL | 0 refills | Status: DC | PRN

## 2019-02-14 MED ORDER — EQUATE NICOTINE 4 MG MT GUM
4.00 | CHEWING_GUM | OROMUCOSAL | Status: DC
Start: ? — End: 2019-02-14

## 2019-02-14 MED ORDER — INSULIN LISPRO 100 UNIT/ML ~~LOC~~ SOLN
1.00 | SUBCUTANEOUS | Status: DC
Start: 2019-02-14 — End: 2019-02-14

## 2019-02-14 MED ORDER — ARIPIPRAZOLE 10 MG PO TABS
5.00 | ORAL_TABLET | ORAL | Status: DC
Start: 2019-02-14 — End: 2019-02-14

## 2019-02-14 MED ORDER — WRIST SPLINT/RIGHT YOUTH MISC
150.00 | Status: DC
Start: 2019-02-13 — End: 2019-02-14

## 2019-02-14 MED ORDER — CVS EAR DROPS OT
40.00 | OTIC | Status: DC
Start: 2019-02-13 — End: 2019-02-14

## 2019-02-14 MED ORDER — ATORVASTATIN CALCIUM 40 MG PO TABS
80.00 | ORAL_TABLET | ORAL | Status: DC
Start: 2019-02-13 — End: 2019-02-14

## 2019-02-14 MED ORDER — Medication
10.00 | Status: DC
Start: 2019-02-13 — End: 2019-02-14

## 2019-02-14 MED ORDER — VITAMIN D3 125 MCG (5000 UT) PO CAPS: 1.0000 | ORAL_CAPSULE | Freq: Every day | ORAL | 3 refills | Status: DC

## 2019-02-14 MED ORDER — POTASSIUM IODIDE 32.5 (24 I) MG PO TABS
20.00 | ORAL_TABLET | ORAL | Status: DC
Start: 2019-02-14 — End: 2019-02-14

## 2019-02-14 MED ORDER — NYSTATIN 100000 UNIT/GM EX POWD
1.00 | CUTANEOUS | Status: DC
Start: ? — End: 2019-02-14

## 2019-02-14 MED ORDER — ZOLPIDEM TARTRATE 5 MG PO TABS
5.00 | ORAL_TABLET | ORAL | Status: DC
Start: ? — End: 2019-02-14

## 2019-02-14 MED ORDER — DAMOR DRESSING EX PADS
5.00 | MEDICATED_PAD | CUTANEOUS | Status: DC
Start: 2019-02-14 — End: 2019-02-14

## 2019-02-14 MED ORDER — COLLAGEN MATRIX FENEST (PORC) EX
300.00 | CUTANEOUS | Status: DC
Start: 2019-02-13 — End: 2019-02-14

## 2019-02-14 MED ORDER — DEXTROSE 50 % IV SOLN
12.50 | INTRAVENOUS | Status: DC
Start: ? — End: 2019-02-14

## 2019-02-14 MED ORDER — LEVOTHYROXINE SODIUM 88 MCG PO TABS
88.00 | ORAL_TABLET | ORAL | Status: DC
Start: 2019-02-14 — End: 2019-02-14

## 2019-02-14 MED ORDER — PREGABALIN 150 MG PO CAPS: 150.0000 mg | ORAL_CAPSULE | Freq: Three times a day (TID) | ORAL | 5 refills | Status: DC

## 2019-02-14 MED ORDER — SOBA HEMORRHOIDAL 0.25-3-12-18 % RE CREA
5.00 | TOPICAL_CREAM | RECTAL | Status: DC
Start: 2019-02-14 — End: 2019-02-14

## 2019-02-14 MED ORDER — Medication
35.00 | Status: DC
Start: 2019-02-13 — End: 2019-02-14

## 2019-02-14 MED ORDER — DICLOFENAC SODIUM 1 % TD GEL
2.00 | TRANSDERMAL | Status: DC
Start: 2019-02-13 — End: 2019-02-14

## 2019-02-14 MED ORDER — INSULIN LISPRO 100 UNIT/ML ~~LOC~~ SOLN
16.00 | SUBCUTANEOUS | Status: DC
Start: 2019-02-14 — End: 2019-02-14

## 2019-02-14 MED ORDER — FOLIC ACID 1 MG PO TABS
1.00 | ORAL_TABLET | ORAL | Status: DC
Start: 2019-02-14 — End: 2019-02-14

## 2019-02-14 NOTE — Unmapped (Signed)
Pt is A&Ox4. Denies pain. Tolerated diet ok without problem. Ambulating independently. VSS. Cont to monitor.  Problem: Adult Inpatient Plan of Care  Goal: Plan of Care Review  Outcome: Progressing  Flowsheets (Taken 02/13/2019 1823)  Progress: improving  Plan of Care Reviewed With: patient  Goal: Patient-Specific Goal (Individualization)  Outcome: Progressing  Flowsheets (Taken 02/13/2019 1823)  Patient-Specific Goals (Include Timeframe): pt will be able to toleated diet without problem this shift  Individualized Care Needs: monitor labs and s/s of bleeding  Anxieties, Fears or Concerns: pt concerns about if she is able to d/c home today  Goal: Absence of Hospital-Acquired Illness or Injury  Outcome: Progressing  Intervention: Identify and Manage Fall Risk  Flowsheets (Taken 02/13/2019 1823)  Safety Interventions:   muscle strengthening facilitated   fall reduction program maintained   nonskid shoes/slippers when out of bed   environmental modification  Intervention: Prevent Skin Injury  Flowsheets (Taken 02/13/2019 1823)  Pressure Reduction Techniques:   frequent weight shift encouraged   heels elevated off bed  Intervention: Prevent VTE (venous thromboembolism)  Flowsheets (Taken 02/13/2019 1823)  VTE Prevention/Management: ambulation promoted  Intervention: Prevent Infection  Flowsheets (Taken 02/13/2019 1823)  Infection Prevention:   environmental surveillance performed   equipment surfaces disinfected   handwashing promoted  Goal: Optimal Comfort and Wellbeing  Outcome: Progressing  Intervention: Monitor Pain and Promote Comfort  Flowsheets (Taken 02/13/2019 1823)  Pain Management Interventions:   pillow support provided   quiet environment facilitated   relaxation techniques promoted   position adjusted  Intervention: Provide Person-Centered Care  Flowsheets (Taken 02/13/2019 1823)  Trust Relationship/Rapport:   care explained   emotional support provided   empathic listening provided   questions answered questions encouraged  Goal: Readiness for Transition of Care  Outcome: Progressing  Intervention: Mutually Develop Transition Plan  Flowsheets (Taken 02/13/2019 1823)  Concerns to be Addressed:   decision making   discharge planning  Goal: Rounds/Family Conference  Outcome: Progressing     Problem: Infection  Goal: Infection Symptom Resolution  Outcome: Progressing     Problem: Wound  Goal: Optimal Wound Healing  Outcome: Progressing

## 2019-02-14 NOTE — Patient Instructions (Signed)
____________________________________________________________________________________________  Medication Rules  Purpose: To inform patients, and their family members, of our rules and regulations.  Applies to: All patients receiving prescriptions (written or electronic).  Pharmacy of record: Pharmacy where electronic prescriptions will be sent. If written prescriptions are taken to a different pharmacy, please inform the nursing staff. The pharmacy listed in the electronic medical record should be the one where you would like electronic prescriptions to be sent.  Electronic prescriptions: In compliance with the [place] Strengthen Opioid Misuse Prevention (STOP) Act of 2017 (Session Law 2017-74/H243), effective April 19, 2018, all controlled substances must be electronically prescribed. Calling prescriptions to the pharmacy will cease to exist.  Prescription refills: Only during scheduled appointments. Applies to all prescriptions.  NOTE: The following applies primarily to controlled substances (Opioid* Pain Medications).   Patient's responsibilities: 1. Pain Pills: Bring all pain pills to every appointment (except for procedure appointments). 2. Pill Bottles: Bring pills in original pharmacy bottle. Always bring the newest bottle. Bring bottle, even if empty. 3. Medication refills: You are responsible for knowing and keeping track of what medications you take and those you need refilled. The day before your appointment: write a list of all prescriptions that need to be refilled. The day of the appointment: give the list to the admitting nurse. Prescriptions will be written only during appointments. No prescriptions will be written on procedure days. If you forget a medication: it will not be "Called in", "Faxed", or "electronically sent". You will need to get another appointment to get these prescribed. No early refills. Do not call asking to have your prescription filled  early. 4. Prescription Accuracy: You are responsible for carefully inspecting your prescriptions before leaving our office. Have the discharge nurse carefully go over each prescription with you, before taking them home. Make sure that your name is accurately spelled, that your address is correct. Check the name and dose of your medication to make sure it is accurate. Check the number of pills, and the written instructions to make sure they are clear and accurate. Make sure that you are given enough medication to last until your next medication refill appointment. 5. Taking Medication: Take medication as prescribed. When it comes to controlled substances, taking less pills or less frequently than prescribed is permitted and encouraged. Never take more pills than instructed. Never take medication more frequently than prescribed.  6. Inform other Doctors: Always inform, all of your healthcare providers, of all the medications you take. 7. Pain Medication from other Providers: You are not allowed to accept any additional pain medication from any other Doctor or Healthcare provider. There are two exceptions to this rule. (see below) In the event that you require additional pain medication, you are responsible for notifying us, as stated below. 8. Medication Agreement: You are responsible for carefully reading and following our Medication Agreement. This must be signed before receiving any prescriptions from our practice. Safely store a copy of your signed Agreement. Violations to the Agreement will result in no further prescriptions. (Additional copies of our Medication Agreement are available upon request.) 9. Laws, Rules, & Regulations: All patients are expected to follow all Federal and State Laws, Statutes, Rules, & Regulations. Ignorance of the Laws does not constitute a valid excuse. The use of any illegal substances is prohibited. 10. Adopted CDC guidelines & recommendations: Target dosing levels will be  at or below 60 MME/day. Use of benzodiazepines** is not recommended.  Exceptions: There are only two exceptions to the rule of not   receiving pain medications from other Healthcare Providers. 1. Exception #1 (Emergencies): In the event of an emergency (i.e.: accident requiring emergency care), you are allowed to receive additional pain medication. However, you are responsible for: As soon as you are able, call our office [phone], at any time of the day or night, and leave a message stating your name, the date and nature of the emergency, and the name and dose of the medication prescribed. In the event that your call is answered by a member of our staff, make sure to document and save the date, time, and the name of the person that took your information.  2. Exception #2 (Planned Surgery): In the event that you are scheduled by another doctor or dentist to have any type of surgery or procedure, you are allowed (for a period no longer than 30 days), to receive additional pain medication, for the acute post-op pain. However, in this case, you are responsible for picking up a copy of our "Post-op Pain Management for Surgeons" handout, and giving it to your surgeon or dentist. This document is available at our office, and does not require an appointment to obtain it. Simply go to our office during business hours (Monday-Thursday from 8:00 AM to 4:00 PM) (Friday 8:00 AM to 12:00 Noon) or if you have a scheduled appointment with us, prior to your surgery, and ask for it by name. In addition, you will need to provide us with your name, name of your surgeon, type of surgery, and date of procedure or surgery.  *Opioid medications include: morphine, codeine, oxycodone, oxymorphone, hydrocodone, hydromorphone, meperidine, tramadol, tapentadol, buprenorphine, fentanyl, methadone. **Benzodiazepine medications include: diazepam (Valium), alprazolam (Xanax), clonazepam (Klonopine), lorazepam (Ativan), clorazepate  (Tranxene), chlordiazepoxide (Librium), estazolam (Prosom), oxazepam (Serax), temazepam (Restoril), triazolam (Halcion) (Last updated: 06/16/2017) ____________________________________________________________________________________________   ____________________________________________________________________________________________  Medication Recommendations and Reminders  Applies to: All patients receiving prescriptions (written and/or electronic).  Medication Rules & Regulations: These rules and regulations exist for your safety and that of others. They are not flexible and neither are we. Dismissing or ignoring them will be considered "non-compliance" with medication therapy, resulting in complete and irreversible termination of such therapy. (See document titled "Medication Rules" for more details.) In all conscience, because of safety reasons, we cannot continue providing a therapy where the patient does not follow instructions.  Pharmacy of record:   Definition: This is the pharmacy where your electronic prescriptions will be sent.   We do not endorse any particular pharmacy.  You are not restricted in your choice of pharmacy.  The pharmacy listed in the electronic medical record should be the one where you want electronic prescriptions to be sent.  If you choose to change pharmacy, simply notify our nursing staff of your choice of new pharmacy.  Recommendations:  Keep all of your pain medications in a safe place, under lock and key, even if you live alone.   After you fill your prescription, take 1 week's worth of pills and put them away in a safe place. You should keep a separate, properly labeled bottle for this purpose. The remainder should be kept in the original bottle. Use this as your primary supply, until it runs out. Once it's gone, then you know that you have 1 week's worth of medicine, and it is time to come in for a prescription refill. If you do this correctly, it  is unlikely that you will ever run out of medicine.  To make sure that the above recommendation works,   it is very important that you make sure your medication refill appointments are scheduled at least 1 week before you run out of medicine. To do this in an effective manner, make sure that you do not leave the office without scheduling your next medication management appointment. Always ask the nursing staff to show you in your prescription , when your medication will be running out. Then arrange for the receptionist to get you a return appointment, at least 7 days before you run out of medicine. Do not wait until you have 1 or 2 pills left, to come in. This is very poor planning and does not take into consideration that we may need to cancel appointments due to bad weather, sickness, or emergencies affecting our staff.  "Partial Fill": If for any reason your pharmacy does not have enough pills/tablets to completely fill or refill your prescription, do not allow for a "partial fill". You will need a separate prescription to fill the remaining amount, which we will not provide. If the reason for the partial fill is your insurance, you will need to talk to the pharmacist about payment alternatives for the remaining tablets, but again, do not accept a partial fill.  Prescription refills and/or changes in medication(s):   Prescription refills, and/or changes in dose or medication, will be conducted only during scheduled medication management appointments. (Applies to both, written and electronic prescriptions.)  No refills on procedure days. No medication will be changed or started on procedure days. No changes, adjustments, and/or refills will be conducted on a procedure day. Doing so will interfere with the diagnostic portion of the procedure.  No phone refills. No medications will be "called into the pharmacy".  No Fax refills.  No weekend refills.  No Holliday refills.  No after hours  refills.  Remember:  Business hours are:  Monday to Thursday 8:00 AM to 4:00 PM Provider's Schedule: Demarquez Ciolek, MD - Appointments are:  Medication management: Monday and Wednesday 8:00 AM to 4:00 PM Procedure day: Tuesday and Thursday 7:30 AM to 4:00 PM Bilal Lateef, MD - Appointments are:  Medication management: Tuesday and Thursday 8:00 AM to 4:00 PM Procedure day: Monday and Wednesday 7:30 AM to 4:00 PM (Last update: 06/16/2017) ____________________________________________________________________________________________   ____________________________________________________________________________________________  CANNABIDIOL (AKA: CBD Oil or Pills)  Applies to: All patients receiving prescriptions of controlled substances (written and/or electronic).  General Information: Cannabidiol (CBD) was discovered in 1940. It is one of some 113 identified cannabinoids in cannabis (Marijuana) plants, accounting for up to 40% of the plant's extract. As of 2018, preliminary clinical research on cannabidiol included studies of anxiety, cognition, movement disorders, and pain.  Cannabidiol is consummed in multiple ways, including inhalation of cannabis smoke or vapor, as an aerosol spray into the cheek, and by mouth. It may be supplied as CBD oil containing CBD as the active ingredient (no added tetrahydrocannabinol (THC) or terpenes), a full-plant CBD-dominant hemp extract oil, capsules, dried cannabis, or as a liquid solution. CBD is thought not have the same psychoactivity as THC, and may affect the actions of THC. Studies suggest that CBD may interact with different biological targets, including cannabinoid receptors and other neurotransmitter receptors. As of 2018 the mechanism of action for its biological effects has not been determined.  In the [place], cannabidiol has a limited approval by the Food and Drug Administration (FDA) for treatment of only two types of epilepsy  disorders. The side effects of long-term use of the drug include somnolence, decreased appetite, diarrhea,   fatigue, malaise, weakness, sleeping problems, and others.  CBD remains a Schedule I drug prohibited for any use.  Legality: Some manufacturers ship CBD products nationally, an illegal action which the FDA has not enforced in 2018, with CBD remaining the subject of an FDA investigational new drug evaluation, and is not considered legal as a dietary supplement or food ingredient as of December 2018. Federal illegality has made it difficult historically to conduct research on CBD. CBD is openly sold in head shops and health food stores in some states where such sales have not been explicitly legalized.  Warning: Because it is not FDA approved for general use or treatment of pain, it is not required to undergo the same manufacturing controls as prescription drugs.  This means that the available cannabidiol (CBD) may be contaminated with THC.  If this is the case, it will trigger a positive urine drug screen (UDS) test for cannabinoids (Marijuana).  Because a positive UDS for illicit substances is a violation of our medication agreement, your opioid analgesics (pain medicine) may be permanently discontinued. (Last update: 07/07/2017) ____________________________________________________________________________________________    

## 2019-02-14 NOTE — Progress Notes (Signed)
Pain Management Virtual Encounter Note - Virtual Visit via Telephone Telehealth (real-time audio visits between healthcare provider and patient).   Patient's Phone No. & Preferred Pharmacy:  [phone] (home); [phone] (mobile); (Preferred) [phone] [email or web address]  [facility] [place] - [place], [place] - [address] [place] [address] Phone: [phone] Fax: [phone]    Pre-screening note:  Our staff contacted [name] [name] and offered her an "in person", "face-to-face" appointment versus a telephone encounter. She indicated preferring the telephone encounter, at this time.   Reason for Virtual Visit: COVID-19*  Social distancing based on CDC and AMA recommendations.   I contacted [name] [name] on 02/14/2019 via telephone.      I clearly identified myself as [name] DoneFrancisco A Tyronza Happe, MD. I verified that I was speaking with the correct person using two identifiers (Name: [name] [name], and date of birth: March 21, 1961).  Advanced Informed Consent I sought verbal advanced consent from [name] [name] for virtual visit interactions. I informed [name] [name] of possible security and privacy concerns, risks, and limitations associated with providing "not-in-person" medical evaluation and management services. I also informed [name] [name] of the availability of "in-person" appointments. Finally, I informed her that there would be a charge for the virtual visit and that she could be  personally, fully or partially, financially responsible for it. [name] [name] expressed understanding and agreed to proceed.   Historic Elements   [name] [name] is a 58 y.o. year old, female patient evaluated today after her last encounter by our practice on 01/22/2019. [name] [name]  has a past medical history of Abdominal wall hernia (01/29/2013), Anxiety, Arthritis, C. difficile colitis, Chronic diastolic heart failure (HCC), Depression, Diabetes mellitus, Diastolic CHF  (HCC), Esophagitis, Fluid retention, GERD (gastroesophageal reflux disease), Hiatal hernia, Hypertension, Hypokalemia due to loss of potassium (10/21/2015), Hypothyroidism, IBS (irritable bowel syndrome), Moderate episode of recurrent major depressive disorder (HCC) (06/03/2004), Morbid obesity (HCC), MRSA (methicillin resistant Staphylococcus aureus) infection (11/2017), Neurogenic bladder, Neuropathy, Obesity, Panic attacks, Rheumatoid arthritis (HCC), and Sleep apnea. She also  has a past surgical history that includes Tubal ligation; Tonsillectomy; Cholecystectomy; Abdominal hysterectomy; Laparoscopic gastric banding (03/20/07); Eye surgery; Hernia repair; and DG GREAT TOE RIGHT FOOT (02/23/2018). [name] [name] has a current medication list which includes the following prescription(s): aripiprazole, atorvastatin, b-d insulin syringe 1cc/25g, buspirone, calcium carb-ergocalciferol, vitamin d3, clotrimazole-betamethasone, diclofenac sodium, fluoxetine, folic acid, hydroxychloroquine, hydroxyzine, tresiba, insulin lispro, klor-con m20, leflunomide, levothyroxine, lisinopril, loperamide, lovastatin, metformin, methotrexate, oxycodone, oxycodone, oxycodone, phenazopyridine, promethazine, quetiapine, vitamin d (ergocalciferol), zolpidem, and pregabalin. She  reports that she quit smoking about 19 years ago. Her smoking use included cigarettes. She has a 54.00 pack-year smoking history. She has never used smokeless tobacco. She reports that she does not drink alcohol or use drugs. [name] [name] is allergic to codeine; propoxyphene; sulfa antibiotics; and hydrocodone.   HPI  Today, she is being contacted for medication management.  The patient's PMP reveals that she received oxycodone 5 mg #20 from [name] EeAmy Poole Wilkes, NP, on 12/17/2018, when in fact she should have had oxycodone left from the prescription that I wrote her for #120 on 11/06/2018, and that she had filled on 11/20/2018.  She then filled my next 11/06/2018 prescription  for #120 on 12/27/2018, 10 days later.  Today the patient was given a final warning regarding this and she was informed that she needs to sign up with "my chart" so that she can look at our medication agreement, medication policy, and medication recommendations, so  that she does not end up again with a violation of our medication agreement.  After having talked to the patient, I think that what she did was not malicious and therefore I will simply give her this final warning, but I will not discontinue prescribing her medications, at this time.  Pharmacotherapy Assessment  Analgesic: Oxycodone IR5 mg, 1 tab PO q 6hrs (20mg /dayof oxycodone) MME/day:30mg /day.   Monitoring: Pharmacotherapy: No side-effects or adverse reactions reported. [place] PMP: PDMP reviewed during this encounter.       Compliance: Medication agreement violation - unsanctioned acquisition/use of additional opioid-containing medication.  The patient given a final warning today. Effectiveness: Clinically acceptable. Plan: Refer to "POC".  UDS:  Summary  Date Value Ref Range Status  11/16/2018 Note  Final    Comment:    ==================================================================== [employer] Select 13 (MW) ==================================================================== Test                             Result       Flag       Units Drug Absent but Declared for Prescription Verification   Oxycodone                      Not Detected UNEXPECTED ng/mg creat ==================================================================== Test                      Result    Flag   Units      Ref Range   Creatinine              71               mg/dL      >=16>=20 ==================================================================== Declared Medications:  The flagging and interpretation on this report are based on the  following declared medications.  Unexpected results may arise from  inaccuracies in the declared medications.   **Note: The testing scope of this panel includes these medications:  Oxycodone  **Note: The testing scope of this panel does not include the  following reported medications:  Aripiprazole (Abilify)  Atorvastatin (Lipitor)  Betamethasone (Lotrisone)  Buspirone (Buspar)  Calcium  Cephalexin (Keflex)  Cholecalciferol  Clotrimazole (Lotrisone)  Diclofenac  Fluoxetine (Prozac)  Folic Acid  Hydroxychloroquine (Plaquenil)  Hydroxyzine  Insulin  Leflunomide (Arava)  Levothyroxine  Lisinopril  Loperamide  Lovastatin  Melatonin  Metformin  Methotrexate  Phenazopyridine (Pyridium)  Potassium  Pregabalin (Lyrica)  Promethazine (Phenergan)  Quetiapine (Seroquel)  Vitamin D2 (Ergocalciferol)  Zolpidem (Ambien) ==================================================================== For clinical consultation, please call [phone]. ====================================================================    Laboratory Chemistry Profile (12 mo)  Renal: 11/16/2018: BUN 13; BUN/Creatinine Ratio 16; Creatinine, Ser 0.80  Lab Results  Component Value Date   GFRAA 95 11/16/2018   GFRNONAA 82 11/16/2018   Hepatic: 11/16/2018: Albumin 4.2 Lab Results  Component Value Date   AST 12 11/16/2018   ALT 14 08/15/2016   Other: 11/16/2018: 25-Hydroxy, Vitamin D 23; 25-Hydroxy, Vitamin D-2 2.2; 25-Hydroxy, Vitamin D-3 21; CRP 14; Sed Rate 76; Vitamin B-12 331 Note: Above Lab results reviewed.  Imaging  Last 90 days:  No results found.  Assessment  The primary encounter diagnosis was Chronic pain syndrome. Diagnoses of Chronic foot pain (Primary Area of Pain) (Bilateral) (L>R), Chronic wrist pain (Secondary area of Pain) (Bilateral) (L>R), Chronic elbow pain (Third area of Pain) (Bilateral) (L>R), Vitamin D insufficiency, Neurogenic pain, Diabetic peripheral neuropathy (HCC), and Neuropathic pain were also pertinent to this visit.  Plan of Care  I have discontinued Blayke E. Tietje's Lantus,  Melatonin, and cephALEXin. I am also having her start on oxyCODONE and oxyCODONE. Additionally, I am having her maintain her clotrimazole-betamethasone, hydrOXYzine, folic acid, levothyroxine, Klor-Con M20, lovastatin, hydroxychloroquine, lisinopril, loperamide, metFORMIN, B-D INSULIN SYRINGE 1CC/25G, methotrexate, leflunomide, atorvastatin, Calcium Carb-Ergocalciferol, Diclofenac Sodium, Vitamin D (Ergocalciferol), promethazine, insulin lispro, phenazopyridine, ARIPiprazole, FLUoxetine, zolpidem, QUEtiapine, busPIRone, Tresiba, Vitamin D3, pregabalin, and oxyCODONE.  Pharmacotherapy (Medications Ordered): Meds ordered this encounter  Medications  . Cholecalciferol (VITAMIN D3) 125 MCG (5000 UT) CAPS    Sig: Take 1 capsule (5,000 Units total) by mouth daily with breakfast. Take along with calcium and magnesium.    Dispense:  90 capsule    Refill:  3    Fill one day early if pharmacy is closed on scheduled refill date. May substitute for generic if available. May substitute with similar over-the-counter product.  . pregabalin (LYRICA) 150 MG capsule    Sig: Take 1 capsule (150 mg total) by mouth 3 (three) times daily.    Dispense:  90 capsule    Refill:  5    Fill one day early if pharmacy is closed on scheduled refill date. May substitute for generic if available.  Marland Kitchen oxyCODONE (OXY IR/ROXICODONE) 5 MG immediate release tablet    Sig: Take 1 tablet (5 mg total) by mouth every 6 (six) hours as needed for severe pain. Must last 30 days    Dispense:  120 tablet    Refill:  0    Chronic Pain: STOP Act (Not applicable) Fill 1 day early if closed on refill date. Do not fill until: 02/14/2019. To last until: 03/16/2019. Avoid benzodiazepines within 8 hours of opioids  . oxyCODONE (OXY IR/ROXICODONE) 5 MG immediate release tablet    Sig: Take 1 tablet (5 mg total) by mouth every 6 (six) hours as needed for severe pain. Must last 30 days    Dispense:  120 tablet    Refill:  0    Chronic Pain: STOP Act  (Not applicable) Fill 1 day early if closed on refill date. Do not fill until: 03/16/2019. To last until: 04/15/2019. Avoid benzodiazepines within 8 hours of opioids  . oxyCODONE (OXY IR/ROXICODONE) 5 MG immediate release tablet    Sig: Take 1 tablet (5 mg total) by mouth every 6 (six) hours as needed for severe pain. Must last 30 days    Dispense:  120 tablet    Refill:  0    Chronic Pain: STOP Act (Not applicable) Fill 1 day early if closed on refill date. Do not fill until: 04/15/2019. To last until: 05/15/2019. Avoid benzodiazepines within 8 hours of opioids   Orders:  No orders of the defined types were placed in this encounter.  Follow-up plan:   Return in about 3 months (around 05/14/2019) for (VV), (MM).      Interventional therapies: Planned, scheduled, and/or pending:   Not at this time.   Considering:   Diagnostic/therapeutic IV lidocaine infusions.  Diagnostic bilateral lumbar sympathetic block  Diagnostic bilateral intra-articular shoulder joint injection  Diagnostic bilateral intra-articular knee injections with local anesthetic and steroid   Diagnostic right-sided CESI  Diagnostic bilateral Cervicalfacet block  Possible bilateral cervical facet RFA.    Palliative PRN treatment(s):   Palliative IV lidocaine infusion      Recent Visits No visits were found meeting these conditions.  Showing recent visits within past 90 days and meeting all other requirements   Today's Visits Date Type Provider  Dept  02/14/19 Office Visit Milinda Pointer, MD [facility]  Showing today's visits and meeting all other requirements   Future Appointments No visits were found meeting these conditions.  Showing future appointments within next 90 days and meeting all other requirements   I discussed the assessment and treatment plan with the patient. The patient was provided an opportunity to ask questions and all were answered. The patient agreed with the plan and  demonstrated an understanding of the instructions.  Patient advised to call back or seek an in-person evaluation if the symptoms or condition worsens.  Total duration of non-face-to-face encounter: 22 minutes.  Note by: Gaspar Cola, MD Date: 02/14/2019; Time: 12:03 PM  Note: This dictation was prepared with Dragon dictation. Any transcriptional errors that may result from this process are unintentional.  Disclaimer:  * Given the special circumstances of the COVID-19 pandemic, the federal government has announced that the Office for Civil Rights (OCR) will exercise its enforcement discretion and will not impose penalties on physicians using telehealth in the event of noncompliance with regulatory requirements under the [place] (HIPAA) in connection with the good faith provision of telehealth during the PRFFM-38 national public health emergency. ([place])

## 2019-02-16 ENCOUNTER — Ambulatory Visit: Admit: 2019-02-16 | Discharge: 2019-02-16 | Disposition: A | Discharge status: Home / Self Care | Payer: MEDICAID

## 2019-02-16 ENCOUNTER — Emergency Department: Admit: 2019-02-16 | Discharge: 2019-02-16 | Disposition: A | Discharge status: Home / Self Care | Payer: MEDICAID

## 2019-02-16 DIAGNOSIS — K469 Unspecified abdominal hernia without obstruction or gangrene: Principal | ICD-10-CM

## 2019-02-16 DIAGNOSIS — R1011 Right upper quadrant pain: Principal | ICD-10-CM

## 2019-02-16 DIAGNOSIS — R109 Unspecified abdominal pain: Principal | ICD-10-CM

## 2019-02-16 LAB — COMPREHENSIVE METABOLIC PANEL
ALKALINE PHOSPHATASE: 80 U/L (ref 38–126)
ALT (SGPT): 15 U/L (ref <35)
ANION GAP: 12 mmol/L (ref 7–15)
AST (SGOT): 20 U/L (ref 14–38)
BILIRUBIN TOTAL: 0.8 mg/dL (ref 0.0–1.2)
BLOOD UREA NITROGEN: 14 mg/dL (ref 7–21)
BUN / CREAT RATIO: 10
CALCIUM: 10.2 mg/dL (ref 8.5–10.2)
CHLORIDE: 103 mmol/L (ref 98–107)
CO2: 29 mmol/L (ref 22.0–30.0)
CREATININE: 1.47 mg/dL — ABNORMAL HIGH (ref 0.60–1.00)
EGFR CKD-EPI AA FEMALE: 45 mL/min/{1.73_m2} — ABNORMAL LOW (ref >=60)
EGFR CKD-EPI NON-AA FEMALE: 39 mL/min/{1.73_m2} — ABNORMAL LOW (ref >=60)
GLUCOSE RANDOM: 236 mg/dL — ABNORMAL HIGH (ref 70–179)
POTASSIUM: 4 mmol/L (ref 3.5–5.0)
PROTEIN TOTAL: 8.2 g/dL (ref 6.5–8.3)
SODIUM: 144 mmol/L (ref 135–145)

## 2019-02-16 LAB — URINALYSIS WITH CULTURE REFLEX
BILIRUBIN UA: NEGATIVE
BLOOD UA: NEGATIVE
GLUCOSE UA: NEGATIVE
HYALINE CASTS: 1 /LPF (ref 0–1)
KETONES UA: NEGATIVE
NITRITE UA: NEGATIVE
PH UA: 5 (ref 5.0–9.0)
PROTEIN UA: NEGATIVE
RBC UA: 2 /HPF (ref <=4)
SQUAMOUS EPITHELIAL: 1 /HPF (ref 0–5)
UROBILINOGEN UA: 0.2
WBC UA: 10 /HPF — ABNORMAL HIGH (ref 0–5)

## 2019-02-16 LAB — CBC W/ AUTO DIFF
BASOPHILS RELATIVE PERCENT: 0.7 %
EOSINOPHILS ABSOLUTE COUNT: 0 10*9/L (ref 0.0–0.4)
EOSINOPHILS RELATIVE PERCENT: 0.2 %
HEMATOCRIT: 39.1 % (ref 36.0–46.0)
HEMOGLOBIN: 12.9 g/dL (ref 12.0–16.0)
LARGE UNSTAINED CELLS: 1 % (ref 0–4)
LYMPHOCYTES ABSOLUTE COUNT: 1.6 10*9/L (ref 1.5–5.0)
LYMPHOCYTES RELATIVE PERCENT: 28.7 %
MEAN CORPUSCULAR HEMOGLOBIN CONC: 32.9 g/dL (ref 31.0–37.0)
MEAN CORPUSCULAR HEMOGLOBIN: 27.3 pg (ref 26.0–34.0)
MEAN CORPUSCULAR VOLUME: 82.9 fL (ref 80.0–100.0)
MEAN PLATELET VOLUME: 8.1 fL (ref 7.0–10.0)
NEUTROPHILS ABSOLUTE COUNT: 3.6 10*9/L (ref 2.0–7.5)
NEUTROPHILS RELATIVE PERCENT: 66.1 %
PLATELET COUNT: 198 10*9/L (ref 150–440)
RED BLOOD CELL COUNT: 4.72 10*12/L (ref 4.00–5.20)
RED CELL DISTRIBUTION WIDTH: 15.8 % — ABNORMAL HIGH (ref 12.0–15.0)
WBC ADJUSTED: 5.5 10*9/L (ref 4.5–11.0)

## 2019-02-16 LAB — BLOOD UA: Hemoglobin:PrThr:Pt:Urine:Ord:Test strip: NEGATIVE

## 2019-02-16 LAB — AST (SGOT): Aspartate aminotransferase:CCnc:Pt:Ser/Plas:Qn:: 20

## 2019-02-16 LAB — SMEAR REVIEW: Lab: 0

## 2019-02-16 LAB — LYMPHOCYTES ABSOLUTE COUNT: Lymphocytes:NCnc:Pt:Bld:Qn:Automated count: 1.6

## 2019-02-16 MED ORDER — GI COCKTAIL
Freq: Three times a day (TID) | ORAL | 0 refills | 1 days | Status: CP | PRN
Start: 2019-02-16 — End: 2019-03-09

## 2019-02-16 NOTE — Unmapped (Signed)
[facility] called stating patient was discharged yesterday and they want her to follow up with [facility] next week. She has no 40 minutes office visits, do you think the patient could be worked in somewhere? Please advise

## 2019-02-16 NOTE — Unmapped (Signed)
Patient discharged this week from [facility] and had onset of abdominal pain, nausea, diarrhea and chills last night. Hx T2DM.

## 2019-02-16 NOTE — Unmapped (Signed)
Physician Discharge Summary [facility]  [address] [facility]  [address]  [place] [address]  Dept: [phone]  Loc: [phone]     Identifying Information:   Amanda Hanson  July 16, 1960  [id number]    Primary Care Physician: Noralyn Pick, FNP   Code Status: Full Code    Admit Date: 02/11/2019    Discharge Date: 02/13/2019     Discharge To: Home    Discharge Service: [facility] - GEN Med W Teaching (MDW)     Discharge Attending Physician: Dr. Willette Alma  Discharge Diagnoses:  Principal Problem:    Abdominal pain POA: Unknown  Active Problems:    Nausea & vomiting POA: Unknown    AKI (acute kidney injury) (CMS-HCC) POA: Unknown    Diarrhea POA: Unknown  Resolved Problems:    * No resolved hospital problems. *      Outpatient Provider Follow Up Issues:   -- Recheck BMP given persistently elevated creatinine  -- Lisinopril and metformin held in setting of elevated creatinine, may resume with resolution of AKI  -- Poorly controlled blood glucose, recommend diabetic education/coaching     Hospital Course:   Blossom Crume is a 58 year old with a history of heart failure, diabetes, multiple abdominal surgeries for hernias, and cholecystectomy presented to the [facility] ER complaining of??a 5???7??days of nausea, new right-sided abdominal pain, and diarrhea.    Hospital course by problem:     RLQ/RUQ and chest pain Patient reported RLQ pain that began a few days after being discharged from the hospital on 02/05/2019. ??She described it as being sharp, with radiation to her right upper quadrant with a heaviness on her chest. She had an EKG performed in the ER which demonstrated no significant change from prior, and a negative troponin.??Patient is already status post cholecystectomy, ruling out gallbladder contributions. There were no outer/superficial stigmata of infection. CT Abdomen showed no evidence of appendicitis. A small hernia was acknowledged, though stable from prior; Gen Surg was consulted???no indications for surgery. CT did identify some pelvic lymphadenopathy, also unchanged from prior. Given patient's poor glucose control, potentially some aspect of gastroparesis, though at baseline, patient does not seem to have issues with early satiety/bloating/nausea. She received treatment with IV fluids, scheduled Tylenol and as needed oxycodone. Pain improved significantly over course of hospitalization, without clear etiology; perhaps a viral gastroenteritis given concurrent nausea/vomiting and diarrhea as below.    ??  Diarrhea  Patient reported diarrhea began after initiating clindamycin for LLQ cellulitis/abscess. Highest concern for C.diff infection given history of prior infection (approximately 1 year ago), though patient failed to produce a stool specimen for analysis after admission. Suspect a viral gastroenteritis.     Nausea\vomiting  Patient reported ongoing nausea and vomiting since hospital discharge on 10/19; well-managed on IV zofran and oral phenergan as needed. Patient tolerating a regular diet on day of discharge.   ??  AKI Creatinine elevated to 1.78 on admission from a baseline of around 0.7. Likely pre-renal in the setting of diarrhea as well as nausea/vomiting; no significant electrolyte derangements; however creatinine failed to respond with moderate fluid resuscitation, creatinine of 1.87 on day of discharge. Patient asked to follow-up with PCP on Friday, 10/30 or Monday 11/02 for close observation given clinical improvement in presenting symptoms.   ??  Hx of LLQ Cellulitis  Clindamycin discontinued on admission; LLQ without signs of continued infection.   ??  Rheumatoid arthritis  Continued prednisone??5??mg daily, as instructed to continue until follow-up with Rheumatology.   ??  Type 2 Diabetes Mellitus  Reduced??home insulin lantus??to 35??U nightly given poor nutritional status in setting of acute illness. Continued 16 U of Lispro with meals TID + SSI. Recommend ongoing diabetic education/coaching and close follow-up with PCP or perhaps even endocrinology if continuing to require steroids for management of RA.   ??  No changes in management of other chronic medical problems    Procedures:  No admission procedures for hospital encounter.  ______________________________________________________________________  Discharge Medications:     Your Medication List      STOP taking these medications    clindamycin 300 MG capsule  Commonly known as: CLEOCIN     lisinopriL 2.5 MG tablet  Commonly known as: PRINIVIL,ZESTRIL     metFORMIN 500 MG tablet  Commonly known as: GLUCOPHAGE        START taking these medications    oxyCODONE 5 MG immediate release tablet  Commonly known as: ROXICODONE  Take 1 tablet (5 mg total) by mouth every four (4) hours as needed for up to 5 doses.        CONTINUE taking these medications    acetaminophen 500 MG tablet  Commonly known as: TYLENOL  Take 500 mg by mouth Three (3) times a day as needed for pain.     ACTEMRA ACTPEN 162 mg/0.9 mL Pnij  Generic drug: tocilizumab Inject the contents of 1 pen (162 mg) under the skin every seven (7) days.     ARIPiprazole 5 MG tablet  Commonly known as: ABILIFY  Take 5 mg by mouth daily.     atorvastatin 80 MG tablet  Commonly known as: LIPITOR  Take 1 tablet (80 mg total) by mouth every evening.     blood sugar diagnostic Strp  Commonly known as: ACCU-CHEK GUIDE TEST STRIPS  by Other route Four (4) times a day (before meals and nightly). Acuu-chek Guide.     busPIRone 10 MG tablet  Commonly known as: BUSPAR  Take 10 mg by mouth Two (2) times a day.     calcium-vitamin D 250-100 mg-unit per tablet  Take 1 tablet by mouth Two (2) times a day.     diclofenac sodium 1 % gel  Commonly known as: VOLTAREN  Apply 2 g topically nightly.     empty container Misc  Use as directed     FLUoxetine 20 MG capsule  Commonly known as: PROzac  Take 20 mg by mouth daily.     folic acid 1 MG tablet  Commonly known as: FOLVITE  Take 1 tablet (1 mg total) by mouth daily.     insulin lispro 100 unit/mL injection  Commonly known as: HumaLOG U-100 Insulin  INJECT 0.20ML (20 UNITS TOTAL) UNDER THE SKIN THREE TIMES A DAY BEFORE MEALS.     insulin syringes (disposable) 1 mL Syrg  100 Units by Miscellaneous route daily. Insulin injecting diabetes 250.00  Inject daily  100 unit syringe (1ml)     levothyroxine 88 MCG tablet  Commonly known as: SYNTHROID  TAKE 1 TABLET BY MOUTH EVERY DAY     LYRICA 150 MG capsule  Generic drug: pregabalin  Take 150 mg by mouth Three (3) times a day.     methotrexate 25 mg/mL injection solution  Inject 1 mL (25 mg total) under the skin once a week.     nystatin 100,000 unit/gram powder  Commonly known as: MYCOSTATIN  Apply 1 application topically 4 (four) times a day as needed.     nystatin 100,000 unit/mL suspension  Commonly known as: MYCOSTATIN  Take 5 mL (500,000 Units total) by mouth Four (4) times a day.     pramipexole 0.125 MG tablet  Commonly known as: MIRAPEX Take 1 tablet (0.125 mg total) by mouth nightly. Can increase to 2 tablets (0.250 mg) in one week if one tablet is not effective.     predniSONE 5 MG tablet  Commonly known as: DELTASONE  Take 20 mg daily x 2 weeks, 15 mg daily x 2 weeks, 10 mg daily x 2 weeks, then 5 mg until visit with rheumatology     promethazine 25 MG tablet  Commonly known as: PHENERGAN  Take 25 mg by mouth every six (6) hours as needed for nausea. Indications: nausea and vomiting after surgery     QUEtiapine 300 MG tablet  Commonly known as: SEROquel  Take 1 tablet (300 mg total) by mouth nightly.     senna 8.6 mg tablet  Commonly known as: SENOKOT  Take 2 tablets by mouth nightly as needed for constipation.     solifenacin 5 MG tablet  Commonly known as: VESICARE  Take 1 tablet (5 mg total) by mouth daily.     TRESIBA FLEXTOUCH U-200 200 unit/mL (3 mL) Inpn  Generic drug: insulin degludec  Use daily as directed by MD - start with 65 units daily, up to 100 units daily.     zolpidem 5 MG tablet  Commonly known as: AMBIEN  Take 5 mg by mouth nightly as needed for sleep.            Allergies:  Cephalexin, Doxycycline, Sulfa (sulfonamide antibiotics), Codeine, Darvocet a500  [propoxyphene n-acetaminophen], Hydrocodone, Meropenem, and Sulfasalazine  ______________________________________________________________________  Pending Test Results (if blank, then none):      Most Recent Labs:  All lab results last 24 hours - No results found for this or any previous visit (from the past 24 hour(s)).    Relevant Studies/Radiology (if blank, then none):  Ecg 12 Lead    Result Date: 02/11/2019  SINUS RHYTHM WITH PREMATURE SUPRAVENTRICULAR BEATS LOW VOLTAGE QRS NO SIGNIFICANT CHANGE SINCE PREVIOUS TRACING PREMATURE ATRIAL BEATS ARE NOW PRESENT Confirmed by Schuyler Amor [phone]) on 02/11/2019 5:14:45 PM    Ct Abdomen Pelvis With Iv Contrast Only    Result Date: 02/11/2019 EXAM: CT ABDOMEN PELVIS W CONTRAST DATE: 02/11/2019 1:26 PM ACCESSION: [id number] UN DICTATED: 02/11/2019 1:27 PM INTERPRETATION LOCATION: [facility] CLINICAL INDICATION: Right sided abd pain ; RLQ abdominal pain, appendicitis suspected (Age => 15y)  COMPARISON: 02/03/2019 TECHNIQUE: A spiral CT scan of the abdomen and pelvis was obtained with IV contrast from the lung bases through the pubic symphysis. Images were reconstructed in the axial plane. Coronal and sagittal reformatted images were also provided for further evaluation. FINDINGS: LINES AND TUBES: None. LOWER THORAX: Coronary vascular calcifications. Bibasilar subsegmental atelectasis. HEPATOBILIARY: No focal hepatic lesions. Cholecystectomy No biliary dilatation.  SPLEEN: Mild splenomegaly PANCREAS: Unremarkable. ADRENALS: Unremarkable. KIDNEYS/URETERS: Symmetric nephrograms. No hydronephrosis. BLADDER: Unremarkable. PELVIC/REPRODUCTIVE ORGANS: Status post hysterectomy. No adnexal mass. GI TRACT: Small hiatal hernia. No dilated or thick walled loops of bowel. Normal appendix PERITONEUM/RETROPERITONEUM AND MESENTERY: No free air or fluid. LYMPH NODES: Stable pelvic sidewall nodes measuring up to 1.3 cm in the right pelvic sidewall and 1.1 cm in the left pelvic sidewall (2:127), unchanged. Enlarged porta hepatis node measuring 1.3 cm (2:36), previously 1.3 cm. VESSELS: The aorta is normal in caliber.  No significant calcified atherosclerotic disease. The portal venous system is patent. The hepatic veins and IVC  are unremarkable. BONES AND SOFT TISSUES: Small fat-containing paramedian hernia along the upper abdomen (2:35), unchanged. Fat-containing fluid containing a right paramedian hernia measuring 7.8 x 4.3 cm (6:95), unchanged. Small fat-containing paraumbilical hernia, unchanged. Midline postsurgical changes. Sacral spinal stimulator in place with battery pack located within the left gluteal soft tissues, unchanged. Scattered sclerotic foci are noted throughout the pelvis and bilateral lower extremities, consistent with bone islands.     -No evidence for appendicitis as clinically questioned. - Small fat and fluid containing ventral hernias, grossly unchanged from prior exam. -Unchanged mesenteric and pelvic adenopathy. Attention on future examination. - Other chronic and incidental findings, as detailed above.     ______________________________________________________________________  Discharge Instructions:   Activity Instructions     Activity as tolerated                Other Instructions     Call MD for:  difficulty breathing, headache or visual disturbances      Call MD for:  persistent nausea or vomiting      Call MD for:  severe uncontrolled pain      Call MD for:  temperature >38.5 Celsius      Discharge instructions      You were admitted to [facility] with abdominal pain, nausea/vomiting, and diarrhea. During your stay you were treated with IV fluids, anti-nausea medications, and pain medication. While we don't have an identifiable source of your illness, your symptoms have improved and you are ready to return home. We feel your left abdominal cellulitis has resolved and you can discontinue your clindamycin, which seems to have resolved your diarrhea. During your hospital stay, your creatinine has increased (a marker of kidney stressed kidneys), as such, we ask that you follow-up with your Primary Care Provider either on Friday or Monday for repeat labs.     Until your follow-up STOP taking these medications:  1. Lisinopril  2. Metformin     Your PCP will determine when is the right time to continue these medications.     You may STOP taking clindamycin all-together.     Returning home, continue to use phenergan as needed for nausea and you may use Tylenol for pain, up to 3 grams daily. Additionally, we have sent a prescription for oxycodone 5 mg (5 tablets) which you can use for breakthrough pain. It has been a pleasure caring for you. We wish you a full and swift recovery.                    Follow Up instructions and Outpatient Referrals     Call MD for:  difficulty breathing, headache or visual disturbances      Call MD for:  persistent nausea or vomiting      Call MD for:  severe uncontrolled pain      Call MD for:  temperature >38.5 Celsius      Discharge instructions            Appointments which have been scheduled for you    Mar 12, 2019  3:00 PM  (Arrive by 2:45 PM)  RETURN  RHEUMATOLOGY with Tommy Rainwater, MD  [facility] RHEUMATOLOGY [address] [facility]) [phone] [address] [place] [address]  [phone]      Mar 19, 2019 10:00 AM  (Arrive by 9:45 AM)  OFFICE VISIT with Loran Senters, FNP  [facility] [place] [facility]) [address]  [place] [address]  [phone]           ______________________________________________________________________  Discharge Day Services:  BP 130/70  - Pulse 69  - Temp 36.8 ??C (Oral)  - Resp 16  - Ht 167.6 cm (5' 6)  - Wt (!) 146.3 kg (322 lb 9.6 oz)  - LMP  (LMP Unknown) Comment: negative pregnancy test - SpO2 96%  - BMI 52.07 kg/m??   Pt seen on the day of discharge and determined appropriate for discharge.    Condition at Discharge: good    Length of Discharge: I spent greater than 30 mins in the discharge of this patient.

## 2019-02-16 NOTE — Unmapped (Signed)
[facility] Refill Coordination Note    Specialty Medication(s) to be Shipped:   Inflammatory Disorders: Actemra    Other medication(s) to be shipped: n/a     Jon Billings, DOB: 01-23-1961  Phone: [phone] (home)       All above HIPAA information was verified with patient.     Completed refill call assessment today to schedule patient's medication shipment from the [facility] [phone]).       Specialty medication(s) and dose(s) confirmed: Regimen is correct and unchanged.   Changes to medications: Beau reports no changes at this time.  Changes to insurance: No  Questions for the pharmacist: No    Confirmed patient received Welcome Packet with first shipment. The patient will receive a drug information handout for each medication shipped and additional FDA Medication Guides as required.       DISEASE/MEDICATION-SPECIFIC INFORMATION        For patients on injectable medications: Patient currently has 0 doses left.  Next injection is scheduled for 02/23/19.    SPECIALTY MEDICATION ADHERENCE     Medication Adherence    Patient reported X missed doses in the last month: 0  Specialty Medication: Actemra  Patient is on additional specialty medications: No  Patient is on more than two specialty medications: No  Any gaps in refill history greater than 2 weeks in the last 3 months: no  Demonstrates understanding of importance of adherence: yes  Informant: patient                Actemra 162mg /0.50ml: Patient has 0 days of medication on hand       SHIPPING     Shipping address confirmed in Epic.     Delivery Scheduled: Yes, Expected medication delivery date: 02/21/19.     Medication will be delivered via Same Day Courier to the prescription address in Epic WAM.    Olga Millers   [facility] Specialty Technician

## 2019-02-17 ENCOUNTER — Ambulatory Visit
Admit: 2019-02-17 | Discharge: 2019-02-17 | Disposition: A | Discharge status: Home / Self Care | Payer: MEDICAID | Attending: Student in an Organized Health Care Education/Training Program

## 2019-02-17 DIAGNOSIS — R112 Nausea with vomiting, unspecified: Principal | ICD-10-CM

## 2019-02-17 LAB — COMPREHENSIVE METABOLIC PANEL
ALBUMIN: 4.2 g/dL (ref 3.5–5.0)
ALKALINE PHOSPHATASE: 82 U/L (ref 38–126)
ALT (SGPT): 15 U/L (ref <35)
ANION GAP: 8 mmol/L (ref 7–15)
AST (SGOT): 20 U/L (ref 14–38)
BLOOD UREA NITROGEN: 12 mg/dL (ref 7–21)
BUN / CREAT RATIO: 9
CALCIUM: 9.6 mg/dL (ref 8.5–10.2)
CHLORIDE: 104 mmol/L (ref 98–107)
CO2: 28 mmol/L (ref 22.0–30.0)
CREATININE: 1.3 mg/dL — ABNORMAL HIGH (ref 0.60–1.00)
EGFR CKD-EPI AA FEMALE: 53 mL/min/{1.73_m2} — ABNORMAL LOW (ref >=60)
EGFR CKD-EPI NON-AA FEMALE: 46 mL/min/{1.73_m2} — ABNORMAL LOW (ref >=60)
GLUCOSE RANDOM: 230 mg/dL — ABNORMAL HIGH (ref 70–179)
POTASSIUM: 3.4 mmol/L — ABNORMAL LOW (ref 3.5–5.0)
SODIUM: 140 mmol/L (ref 135–145)

## 2019-02-17 LAB — CBC W/ AUTO DIFF
BASOPHILS ABSOLUTE COUNT: 0 10*9/L (ref 0.0–0.1)
BASOPHILS RELATIVE PERCENT: 0.3 %
EOSINOPHILS RELATIVE PERCENT: 0 %
HEMATOCRIT: 36.8 % (ref 36.0–46.0)
HEMOGLOBIN: 12.3 g/dL (ref 12.0–16.0)
LARGE UNSTAINED CELLS: 1 % (ref 0–4)
LYMPHOCYTES ABSOLUTE COUNT: 1.1 10*9/L — ABNORMAL LOW (ref 1.5–5.0)
LYMPHOCYTES RELATIVE PERCENT: 17.6 %
MEAN CORPUSCULAR HEMOGLOBIN CONC: 33.5 g/dL (ref 31.0–37.0)
MEAN CORPUSCULAR HEMOGLOBIN: 27.4 pg (ref 26.0–34.0)
MEAN CORPUSCULAR VOLUME: 81.8 fL (ref 80.0–100.0)
MEAN PLATELET VOLUME: 7.5 fL (ref 7.0–10.0)
MONOCYTES ABSOLUTE COUNT: 0.2 10*9/L (ref 0.2–0.8)
NEUTROPHILS ABSOLUTE COUNT: 5 10*9/L (ref 2.0–7.5)
NEUTROPHILS RELATIVE PERCENT: 77.7 %
PLATELET COUNT: 199 10*9/L (ref 150–440)
RED BLOOD CELL COUNT: 4.5 10*12/L (ref 4.00–5.20)
RED CELL DISTRIBUTION WIDTH: 15.6 % — ABNORMAL HIGH (ref 12.0–15.0)
WBC ADJUSTED: 6.4 10*9/L (ref 4.5–11.0)

## 2019-02-17 LAB — CALCIUM: Calcium:MCnc:Pt:Ser/Plas:Qn:: 9.6

## 2019-02-17 LAB — LIPASE: Triacylglycerol lipase:CCnc:Pt:Ser/Plas:Qn:: 91

## 2019-02-17 LAB — LACTATE BLOOD VENOUS: Lactate:SCnc:Pt:BldV:Qn:: 1.7

## 2019-02-17 LAB — PLATELET COUNT: Platelets:NCnc:Pt:Bld:Qn:Automated count: 199

## 2019-02-17 MED ORDER — PROMETHAZINE 25 MG RECTAL SUPPOSITORY
Freq: Four times a day (QID) | RECTAL | 0 refills | 3.00000 days | Status: CP | PRN
Start: 2019-02-17 — End: 2019-02-24

## 2019-02-17 MED ORDER — ONDANSETRON 4 MG DISINTEGRATING TABLET
ORAL_TABLET | Freq: Three times a day (TID) | ORAL | 0 refills | 5 days | Status: CP | PRN
Start: 2019-02-17 — End: 2019-02-24

## 2019-02-17 NOTE — Unmapped (Signed)
Pt presents to ED with CC of Abdominal pain. Pt states I'm real nauseated with a lot of stomach pain.  Pt seen yesterday for the same, states has gotten worse.      Pt tested for COVID last night and was negative.

## 2019-02-17 NOTE — Unmapped (Addendum)
[facility]  Emergency Department Provider Note    ED Clinical Impression     Final diagnoses:   Abdominal hernia without obstruction and without gangrene, recurrence not specified, unspecified hernia type   Right upper quadrant abdominal pain   Abdominal pain, unspecified abdominal location (Primary)       Initial Impression, ED Course, Assessment and Plan     Impression:  PMHx (per chart review)  heart failure, diabetes, multiple abdominal surgeries for hernias, cholecystectomy, previous episode of C. difficile, rheumatoid arthritis on methotrexate, and chronic pain followed by chronic pain clinic, presenting today for abdominal pain and vomiting. she was recently hospitalized from 10/25???10/27 for similar symptoms.  Current symptoms of the right upper quadrant and right lower quadrant pain are similar to the symptoms she had experienced when she was admitted on 02/11/2019.  She is having bowel movements, last bowel movement this a.m., was softer than normal, but not watery diarrhea.  Denies fevers.  On exam, patient is afebrile, vital signs stable.  She has some tenderness in right upper quadrant epigastric area.  Bowel sounds present, no rebound or guarding.    During recent admission, a CT was obtained of the abdomen pelvis which demonstrated increased size of a previously known right paramedian hernia up to 7.8 x 4.3 cm without bowel trapped. Surgery planned for clinic referral. With presentation today there can still be concern for ileus despite passing BM. Will obtain KUB. Will obtain basic labs to evaluate for infection. Low suspiciion for c diff, as patient no longer has diarrhea..    6:30 pm KUB does not demonstrate obstruction. Will provide GI cocktail for symptoms, as patient says still feels nauseous and with pain despite oxy and phenergan administration. 7:15 PM???patient states her symptoms have improved with GI cocktail.  Patient feels comfortable going home.  Will prescribe the patient with generic GI cocktail medication to take as needed at home for nausea and abdominal pain.  We discussed with the patient that she should follow-up with her primary care provider to discuss her enlarged liver and spleen.  Her primary care provider will have to refer her to GI.  Per prior surgery consult note while she was seen in the inpatient setting, it was noted the patient will follow up in clinic with the surgery team regarding her hernia.  I also placed referral for general surgery in the outpatient setting to ensure she does have follow-up, as I have not seen a future encounter for this just yet although it may be pending.    We discussed return precautions with the patient.  All questions were answered, patient was agreeable with the plan.    Additional Medical Decision Making       I independently visualized the EKG tracing.   I independently visualized the radiology images.   I reviewed the patient's prior medical records, including recent hospitalization and ED visit.    Any labs and radiology results that were available during my care of the patient were independently reviewed by me and considered in my medical decision making.    Portions of this record have been created using Scientist, clinical (histocompatibility and immunogenetics). Dictation errors have been sought, but may not have been identified and corrected.  ____________________________________________    I have reviewed the triage vital signs and the nursing notes.     History     Chief Complaint  Abdominal Pain      HPI Amanda Hanson is a 58 y.o. female PMHx (per chart  review)  heart failure, diabetes, multiple abdominal surgeries for hernias, cholecystectomy, previous episode of C. difficile, rheumatoid arthritis on methotrexate, and chronic pain followed by chronic pain clinic, presenting today for abdominal pain and vomiting.  she was recently hospitalized from 10/25???10/27 for similar symptoms.  She says that during her hospitalization her nausea, vomiting, pain were well controlled, her symptoms resolved, and she was able to return home.  She is asymptomatic after discharge until last night, where she said her vomiting return additional abdominal pain.  She has vomited 4 times, and now it has dry heaving.  Emesis is yellow/clear, without any frank blood or black color.  Her abdominal pain is in the right upper quadrant and right lower quadrant.  6/10 pain, sharp, on/off, worse when she vomits.  States her symptoms are the same location, intensity, and quality as they were when she presented to the ED on 02/11/2019.  Denies any recent abdominal trauma.  Denies fevers, reports that he had some chills last night.  She denies watery diarrhea, however she does state her stools are softer than he normally would be.  Last bowel movement was today a.m., without any blood.  She has tried taking Phenergan at home but says it does not help, says she is taking her pain medications as she has and it does not help much.  Last NPO last night.    Patient was recently on clindamycin first lower extremity cellulitis, it was discontinued during her recent hospitalization for concern of C. difficile.  She is not currently taking her clindamycin.    Denies any chest pain, shortness of breath, lightheadedness, dizziness, dysuria, vaginal pain.      Past Medical History:   Diagnosis Date   ??? Abnormal mammogram    ??? Anxiety disorder    ??? Arthritis    ??? Congestive heart failure (CHF) (CMS-HCC)    ??? DDD (degenerative disc disease) ??? Diabetes mellitus type 2 in obese (CMS-HCC) 2011   ??? GERD (gastroesophageal reflux disease)    ??? Hyperlipidemia    ??? Hypothyroidism    ??? Mixed incontinence urge and stress (female)(female)    ??? Obesity    ??? Other functional disorder of bladder    ??? Rheumatoid arthritis(714.0)    ??? Sleep apnea    ??? Urinary frequency    ??? Urinary obstruction, not elsewhere classified        Patient Active Problem List   Diagnosis   ??? Major depressive disorder, recurrent episode, moderate (CMS-HCC)   ??? Hypothyroidism   ??? Obesity   ??? Obstructive sleep apnea   ??? Rheumatoid arthritis flare (CMS-HCC)   ??? Overactive detrusor   ??? Mixed incontinence   ??? Hypokalemia due to loss of potassium   ??? Hypomagnesemia   ??? QT prolongation   ??? Type 2 diabetes mellitus with hyperglycemia (CMS-HCC)   ??? Anxiety   ??? GERD (gastroesophageal reflux disease)   ??? Neuropathy   ??? Abdominal wall hernia   ??? Adhesive capsulitis   ??? Mixed anxiety depressive disorder   ??? Bipolar disorder, unspecified (CMS-HCC)   ??? Chronic diastolic CHF (congestive heart failure), NYHA class 3 (CMS-HCC)   ??? COPD (chronic obstructive pulmonary disease) (CMS-HCC)   ??? Diastolic dysfunction   ??? Knee pain, bilateral   ??? Vitamin D deficiency   ??? Chronic pain of both wrists   ??? Chronic pain syndrome   ??? Chronic cystitis   ??? Morbid (severe) obesity due to excess calories (CMS-HCC)   ??? Diabetic  ulcer of heel (CMS-HCC)   ??? Perineal abscess   ??? Cellulitis   ??? Diabetic ulcer of toe of right foot associated with type 2 diabetes mellitus (CMS-HCC)   ??? Ulcer of left heel and midfoot with fat layer exposed (CMS-HCC)   ??? Decubitus ulcer of heel, bilateral   ??? Osteomyelitis due to type 2 diabetes mellitus (CMS-HCC)   ??? Subacute vulvitis   ??? MRSA (methicillin resistant Staphylococcus aureus) infection   ??? Cellulitis, abdominal wall   ??? Abdominal pain   ??? Nausea & vomiting   ??? AKI (acute kidney injury) (CMS-HCC)   ??? Diarrhea       Past Surgical History:   Procedure Laterality Date ??? BREAST BIOPSY Left 5 15 2017    with clip   ??? CHOLECYSTECTOMY     ??? Cystoscopy with Hydrodistention      11/2004   ??? HERNIA REPAIR     ??? HYSTERECTOMY  2000   ??? Interstim Placement      02/2005   ??? LAPAROSCOPIC GASTRIC BANDING     ??? OOPHORECTOMY Bilateral 2000   ??? PR AMPUTATION METATARSAL+TOE,SINGLE Right 08/24/2018    Procedure: PRIORITY 2nd ray amputation;  Surgeon: Karen Chafe, DPM;  Location: MAIN OR [facility];  Service: Vascular   ??? PR AMPUTATION TOE,MT-P JT Right 02/23/2018    Procedure: AMPUTATION, TOE; METATARSOPHALANGEAL JOINT;  Surgeon: Karen Chafe, DPM;  Location: MAIN OR [facility];  Service: Vascular   ??? PR AMPUTATION TOE,MT-P JT Right 08/02/2018    Procedure: AMPUTATION, TOE; METATARSOPHALANGEAL JOINT;  Surgeon: Webb Silversmith, MD;  Location: MAIN OR [facility];  Service: Vascular   ??? PR DEBRIDEMENT, SKIN, SUB-Q TISSUE,=<20 SQ CM Right 11/15/2017    Procedure: Debridement of right groin/perineal wound;  Surgeon: Joanie Coddington, MD;  Location: MAIN OR [facility];  Service: Trauma   ??? PR DEBRIDEMENT, SKIN, SUB-Q TISSUE,MUSCLE,BONE,=<20 SQ CM Right 02/28/2018    Procedure: DEBRIDEMENT; SKIN, SUBCUTANEOUS TISSUE, MUSCLE, & BONE FOOT;  Surgeon: Karen Chafe, DPM;  Location: MAIN OR [facility];  Service: Vascular   ??? PR DEBRIDEMENT, SKIN, SUB-Q TISSUE,MUSCLE,BONE,=<20 SQ CM Right 03/28/2018    Procedure: DEBRIDEMENT; SKIN, SUBCUTANEOUS TISSUE, MUSCLE, & BONE FOOT;  Surgeon: Karen Chafe, DPM;  Location: MAIN OR [facility];  Service: Vascular   ??? PR FULL EXCIS 1ST METATARSAL HEAD Right 03/28/2018    Procedure: OSTECTOMY COMPLT EXC; FIRST METATARSAL HEAD;  Surgeon: Karen Chafe, DPM;  Location: MAIN OR [facility];  Service: Vascular   ??? PR I&D OF VULVA/PERINEUM ABSCESS Left 12/13/2018    Procedure: Incision And Drainage Of Vulva Or Perineal Abscess;  Surgeon: Joanie Coddington, MD;  Location: MAIN OR [facility];  Service: Trauma ??? PR I&D PERIANAL ABSCESS,SUPERFICIAL Left 12/13/2018    Procedure: INCISION AND DRAINAGE, PERIANAL ABSCESS, SUPERFICIAL;  Surgeon: Joanie Coddington, MD;  Location: MAIN OR [facility];  Service: Trauma   ??? PR INCIS/DRAIN THIGH/KNEE ABSCESS,DEEP Left 06/14/2018    Procedure: Deep thigh abscess drainage;  Surgeon: Suella Broad, MD;  Location: MAIN OR [facility];  Service: Trauma   ??? PR SECD CLOS SURG WND EXTEN/COMPLIC Right 02/28/2018    Procedure: SECONDARY CLOSURE OF SURGICAL WOUND OR DEHISCENCE, EXTENSIVE OR COMPLICATED;  Surgeon: Karen Chafe, DPM;  Location: MAIN OR [facility];  Service: Vascular       No current facility-administered medications for this encounter.     Current Outpatient Medications:   ???  acetaminophen (TYLENOL) 500 MG tablet, Take 500 mg by mouth  Three (3) times a day as needed for pain., Disp: , Rfl:   ???  ARIPiprazole (ABILIFY) 5 MG tablet, Take 5 mg by mouth daily., Disp: , Rfl:   ???  atorvastatin (LIPITOR) 80 MG tablet, Take 1 tablet (80 mg total) by mouth every evening., Disp: 90 tablet, Rfl: 1  ???  blood sugar diagnostic (ACCU-CHEK GUIDE) Strp, by Other route Four (4) times a day (before meals and nightly). Acuu-chek Guide., Disp: 120 each, Rfl: 5  ???  busPIRone (BUSPAR) 10 MG tablet, Take 10 mg by mouth Two (2) times a day. , Disp: , Rfl:   ???  calcium-vitamin D 250-100 mg-unit per tablet, Take 1 tablet by mouth Two (2) times a day., Disp: 60 tablet, Rfl: 11  ???  diclofenac sodium (VOLTAREN) 1 % gel, Apply 2 g topically nightly., Disp: , Rfl:   ???  empty container Misc, Use as directed, Disp: 1 each, Rfl: PRN  ???  FLUoxetine (PROZAC) 20 MG capsule, Take 20 mg by mouth daily. , Disp: , Rfl:   ???  folic acid (FOLVITE) 1 MG tablet, Take 1 tablet (1 mg total) by mouth daily., Disp: 90 tablet, Rfl: 3  ???  GI cocktail, Take 15 mL by mouth every eight (8) hours as needed (nausea, abd pain) for up to 21 days., Disp: 15 mL, Rfl: 0 ???  insulin degludec (TRESIBA FLEXTOUCH U-200) 200 unit/mL (3 mL) InPn, Use daily as directed by MD - start with 65 units daily, up to 100 units daily., Disp: 18 mL, Rfl: 4  ???  insulin lispro (HUMALOG U-100 INSULIN) 100 unit/mL injection, INJECT 0.20ML (20 UNITS TOTAL) UNDER THE SKIN THREE TIMES A DAY BEFORE MEALS., Disp: 50 mL, Rfl: 1  ???  insulin syringes, disposable, 1 mL Syrg, 100 Units by Miscellaneous route daily. Insulin injecting diabetes 250.00 Inject daily 100 unit syringe (1ml), Disp: 100 each, Rfl: 3  ???  levothyroxine (SYNTHROID) 88 MCG tablet, TAKE 1 TABLET BY MOUTH EVERY DAY, Disp: 90 tablet, Rfl: 1  ???  LYRICA 150 mg capsule, Take 150 mg by mouth Three (3) times a day., Disp: , Rfl: 0  ???  methotrexate 25 mg/mL injection solution, Inject 1 mL (25 mg total) under the skin once a week., Disp: 10 mL, Rfl: 3  ???  nystatin (MYCOSTATIN) 100,000 unit/gram powder, Apply 1 application topically 4 (four) times a day as needed., Disp: , Rfl:   ???  nystatin (MYCOSTATIN) 100,000 unit/mL suspension, Take 5 mL (500,000 Units total) by mouth Four (4) times a day., Disp: 60 mL, Rfl: 0  ???  oxyCODONE (ROXICODONE) 5 MG immediate release tablet, Take 1 tablet (5 mg total) by mouth every four (4) hours as needed for up to 5 doses., Disp: 10 tablet, Rfl: 0  ???  pramipexole (MIRAPEX) 0.125 MG tablet, Take 1 tablet (0.125 mg total) by mouth nightly. Can increase to 2 tablets (0.250 mg) in one week if one tablet is not effective., Disp: 60 tablet, Rfl: 1  ???  predniSONE (DELTASONE) 5 MG tablet, Take 20 mg daily x 2 weeks, 15 mg daily x 2 weeks, 10 mg daily x 2 weeks, then 5 mg until visit with rheumatology, Disp: 182 tablet, Rfl: 1  ???  promethazine (PHENERGAN) 25 MG tablet, Take 25 mg by mouth every six (6) hours as needed for nausea. Indications: nausea and vomiting after surgery, Disp: , Rfl:   ???  QUEtiapine (SEROQUEL) 300 MG tablet, Take 1 tablet (300 mg total) by mouth nightly.,  Disp: 90 tablet, Rfl: 3 ???  senna (SENOKOT) 8.6 mg tablet, Take 2 tablets by mouth nightly as needed for constipation., Disp: 60 tablet, Rfl: 0  ???  solifenacin (VESICARE) 5 MG tablet, Take 1 tablet (5 mg total) by mouth daily., Disp: 30 tablet, Rfl: 12  ???  tocilizumab (ACTEMRA ACTPEN) 162 mg/0.9 mL PnIj, Inject the contents of 1 pen (162 mg) under the skin every seven (7) days., Disp: 10.8 mL, Rfl: 3  ???  zolpidem (AMBIEN) 5 MG tablet, Take 5 mg by mouth nightly as needed for sleep., Disp: , Rfl:     Allergies  Cephalexin, Doxycycline, Sulfa (sulfonamide antibiotics), Codeine, Darvocet a500  [propoxyphene n-acetaminophen], Hydrocodone, Meropenem, and Sulfasalazine    Family History   Problem Relation Age of Onset   ??? Cancer Maternal Grandmother         Lung cancer   ??? Diabetes Maternal Grandmother    ??? Rheum arthritis Sister    ??? GU problems Neg Hx    ??? Kidney cancer Neg Hx    ??? Prostate cancer Neg Hx        Social History  Social History     Tobacco Use   ??? Smoking status: Former Smoker     Packs/day: 2.00     Years: 27.00     Pack years: 54.00     Types: Cigarettes     Quit date: 2003     Years since quitting: 17.8   ??? Smokeless tobacco: Former Neurosurgeon     Quit date: 2003   ??? Tobacco comment: Started smoking at 22, quit 2003.    Substance Use Topics   ??? Alcohol use: Yes     Binge frequency: Less than monthly     Comment: rarely   maybe 2 beers a year   ??? Drug use: No       Review of Systems  Constitutional: Negative for fever.  Eyes: Negative for visual changes.  ENT: Negative for sore throat.  Cardiovascular: Negative for chest pain.  Respiratory: Negative for shortness of breath.  Gastrointestinal: Positive for abdominal pain as above.  Genitourinary: Negative for dysuria.  Musculoskeletal: Negative for back pain.  Skin: Negative for rash.  Neurological: Negative for headaches, focal weakness or numbness.      Physical Exam     ED Triage Vitals [02/16/19 1445]   Enc Vitals Group      BP 149/91      Heart Rate 77      SpO2 Pulse Resp 16      Temp 37.4 ??C (99.3 ??F)      Temp Source Oral      SpO2 98 %      Weight       Height       Head Circumference       Peak Flow       Pain Score       Pain Loc       Pain Edu?       Excl. in GC?          Constitutional: Alert and oriented. Well appearing and in no distress.  Eyes: Conjunctivae are normal.  ENT       Head: Normocephalic and atraumatic.       Nose: No congestion.       Mouth/Throat: Mucous membranes are moist.       Neck: No stridor.  Hematological/Lymphatic/Immunilogical: No cervical lymphadenopathy.  Cardiovascular: Normal rate, regular rhythm.  Radial pulse  and DP pulse 2+ bilaterally.  Respiratory: Normal respiratory effort.  No wheezing or rhonchi.  Breath sounds are normal.  Gastrointestinal: Soft.  Nondistended.  Tenderness to palpation right upper quadrant, epigastric area, mid right quadrant.  Nontender right lower quadrant.,  No difficult to assess given body habitus.  No rebound or guarding. there is no CVA tenderness.  Genitourinary: No Foley in place  Musculoskeletal: Normal range of motion in all extremities.       Right lower leg: No tenderness or edema.  There are signs of superficial cellulitis       Left lower leg: No tenderness or edema. There are signs of superficial cellulitis  Neurologic: Normal speech and language. No gross focal neurologic deficits are appreciated.  Skin: Skin is warm, dry and intact. No rash noted.  Psychiatric: Mood and affect are normal. Speech and behavior are normal.      EKG     Sinus tachycardia.  Left axis.  No acute ST segment elevation appreciated. T wave inversion noted in V1. QT/QTc 342/460.    Radiology       X-ray abdomen  FINDINGS:   ??  Grid artifact is present on the first image. Surgical clips in bilateral upper quadrants. Left lower quadrant sacral neurostimulator device.  ??  Overpenetrated lung bases clear. Hepatosplenomegaly. Nonobstructive bowel gas pattern. Calcified pelvic phleboliths. Mild bilateral hip osteoarthrosis. IMPRESSION:  ??  Nonobstructive bowel gas pattern.  ??  Hepatosplenomegaly.  I independently visualized these images.    Procedures     Procedure(s) performed: None.           Marthenia Rolling, MD  Resident  02/17/19 [id number]       Marthenia Rolling, MD  Resident  02/17/19 [phone]

## 2019-02-17 NOTE — Unmapped (Signed)
[facility]  Emergency Department Provider Note    ED Clinical Impression     Final diagnoses:   Non-intractable vomiting with nausea, unspecified vomiting type (Primary)     Initial Impression, ED Course, Assessment and Plan     Impression: Patient is a 58 y.o. female with PMH of recurrent nausea & vomiting, chronic pain, anxiety disorder, arthritis, CHF, DDD, T2DM, GERD, HLD, hypothyroidism, obesity, RA, sleep apnea, urinary frequency and incontinence presenting for nausea and vomiting since yesterday that returned after receiving a GI cocktail in the ED yesterday.     Vital signs are notable for BP elevated to 160/118, otherwise within normal limits. The patient is tearful-appearing and appears to be in no distress. On exam, abdomen is obese, soft and nontender.    At this time, there is low concern for incarcerated or strangulated hernia, acute coronary syndrome, pneumonia.  Most concern for cyclic nausea and vomiting, possibly related to gastroparesis.  Will treat with Haldol, Zofran.    On reassessment, patient feels much improved.  Asking for GI cocktail, will p.o. challenge with ginger ale and crackers as well.  Abdomen remains soft, nontender.    12:40 PM   CMP is notable for creatinine elevated to 1.30 and BG elevated to 230, otherwise unremarkable. Lipase and lactate are WNL. CBC unremarkable.     The patient's mother is really concerned about nausea.  Will obtain ECG to rule out ACS in this 58 year old diabetic.  EKG appears similar to yesterday, no signs of acute ischemia or infarct.  Patient has tolerated p.o. fluids and solids.  She is amenable with the plan to be discharged home with Zofran and Phenergan prescriptions.  I have highly encouraged PCP follow-up for medication review.  Usual return precautions are discussed, patient and her mother demonstrate understanding.  Hemodynamically stable and safe for discharge home.    Additional Medical Decision Making I have reviewed the vital signs and the nursing notes. Labs and radiology results that were available during my care of the patient were independently reviewed by me and considered in my medical decision making.     I staffed the case with the ED attending, Dr. Dortha Schwalbe.    Portions of this record have been created using Scientist, clinical (histocompatibility and immunogenetics). Dictation errors have been sought, but may not have been identified and corrected.  ____________________________________________       History     Chief Complaint  Abdominal Pain      HPI   Lucy Boardman is a 58 y.o. female with PMH of abnormal mammogram, anxiety disorder, arthritis, CHF, DDD, T2DM, GERD, HLD, hypothyroidism, obesity, RA, sleep apnea, urinary frequency and incontinence who presents to the ED for nausea and vomiting. The patient reports that she has had 8-10 episodes of clear, non-bilious, non-bloody emesis since being discharged from the ED yesterday and has only been able to take a few sips of water over the past couple of days. 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 Patient received a negative COVID-19 test result yesterday. Denies any fever, cough, or any other medical concerns at this time. Per chart review, the patient was seen in the ED on 02/03/19 for redness surrounding an area of her LLQ where she injects Lovenox (had drainage of a vulvovaginal abscess in August 2020) and noted some foul smelling drainage at that time. She was admitted to the hospital and discharged on 02/05/19 with a prescription for Clindamycin for cellulitis. The  patient was admitted for second time on 02/11/19 for sharp, radiating RLQ pain that began shortly after being discharged on 10/19 for nausea and vomiting, with creatine elevated to 1.78. CT AP from admission noted a 7.8 x 4.3 cm para paramedian hernia and the surgery was planned for clinic referral, which the patient denies receiving follow up for. She was discharged on 02/13/19 after her symptoms resolved.     She then presented to the ED yesterday for the same RLQ abdominal pain, nausea and vomiting that she had when she was admitted on 02/11/19. She received a GI cocktail, oxycodone and phenergan which temporarily relieved her symptoms and she re-presented today for the same as her symptoms returned.       Past Medical History:   Diagnosis Date   ??? Abnormal mammogram    ??? Anxiety disorder    ??? Arthritis    ??? Congestive heart failure (CHF) (CMS-HCC)    ??? DDD (degenerative disc disease)    ??? Diabetes mellitus type 2 in obese (CMS-HCC) 2011   ??? GERD (gastroesophageal reflux disease)    ??? Hyperlipidemia    ??? Hypothyroidism    ??? Mixed incontinence urge and stress (female)(female)    ??? Obesity    ??? Other functional disorder of bladder    ??? Rheumatoid arthritis(714.0)    ??? Sleep apnea    ??? Urinary frequency    ??? Urinary obstruction, not elsewhere classified        Patient Active Problem List   Diagnosis   ??? Major depressive disorder, recurrent episode, moderate (CMS-HCC)   ??? Hypothyroidism   ??? Obesity   ??? Obstructive sleep apnea   ??? Rheumatoid arthritis flare (CMS-HCC)   ??? Overactive detrusor   ??? Mixed incontinence   ??? Hypokalemia due to loss of potassium ??? Hypomagnesemia   ??? QT prolongation   ??? Type 2 diabetes mellitus with hyperglycemia (CMS-HCC)   ??? Anxiety   ??? GERD (gastroesophageal reflux disease)   ??? Neuropathy   ??? Abdominal wall hernia   ??? Adhesive capsulitis   ??? Mixed anxiety depressive disorder   ??? Bipolar disorder, unspecified (CMS-HCC)   ??? Chronic diastolic CHF (congestive heart failure), NYHA class 3 (CMS-HCC)   ??? COPD (chronic obstructive pulmonary disease) (CMS-HCC)   ??? Diastolic dysfunction   ??? Knee pain, bilateral   ??? Vitamin D deficiency   ??? Chronic pain of both wrists   ??? Chronic pain syndrome   ??? Chronic cystitis   ??? Morbid (severe) obesity due to excess calories (CMS-HCC)   ??? Diabetic ulcer of heel (CMS-HCC)   ??? Perineal abscess   ??? Cellulitis   ??? Diabetic ulcer of toe of right foot associated with type 2 diabetes mellitus (CMS-HCC)   ??? Ulcer of left heel and midfoot with fat layer exposed (CMS-HCC)   ??? Decubitus ulcer of heel, bilateral   ??? Osteomyelitis due to type 2 diabetes mellitus (CMS-HCC)   ??? Subacute vulvitis   ??? MRSA (methicillin resistant Staphylococcus aureus) infection   ??? Cellulitis, abdominal wall   ??? Abdominal pain   ??? Nausea & vomiting   ??? AKI (acute kidney injury) (CMS-HCC)   ??? Diarrhea       Past Surgical History:   Procedure Laterality Date   ??? BREAST BIOPSY Left 5 15 2017    with clip   ??? CHOLECYSTECTOMY     ??? Cystoscopy with Hydrodistention      11/2004   ??? HERNIA REPAIR     ???  HYSTERECTOMY  2000   ??? Interstim Placement      02/2005   ??? LAPAROSCOPIC GASTRIC BANDING     ??? OOPHORECTOMY Bilateral 2000   ??? PR AMPUTATION METATARSAL+TOE,SINGLE Right 08/24/2018    Procedure: PRIORITY 2nd ray amputation;  Surgeon: Karen Chafe, DPM;  Location: MAIN OR [facility];  Service: Vascular   ??? PR AMPUTATION TOE,MT-P JT Right 02/23/2018    Procedure: AMPUTATION, TOE; METATARSOPHALANGEAL JOINT;  Surgeon: Karen Chafe, DPM;  Location: MAIN [facility];  Service: Vascular   ??? PR AMPUTATION TOE,MT-P JT Right 08/02/2018 Procedure: AMPUTATION, TOE; METATARSOPHALANGEAL JOINT;  Surgeon: Webb Silversmith, MD;  Location: MAIN OR [facility];  Service: Vascular   ??? PR DEBRIDEMENT, SKIN, SUB-Q TISSUE,=<20 SQ CM Right 11/15/2017    Procedure: Debridement of right groin/perineal wound;  Surgeon: Joanie Coddington, MD;  Location: MAIN OR [facility];  Service: Trauma   ??? PR DEBRIDEMENT, SKIN, SUB-Q TISSUE,MUSCLE,BONE,=<20 SQ CM Right 02/28/2018    Procedure: DEBRIDEMENT; SKIN, SUBCUTANEOUS TISSUE, MUSCLE, & BONE FOOT;  Surgeon: Karen Chafe, DPM;  Location: MAIN OR [facility];  Service: Vascular   ??? PR DEBRIDEMENT, SKIN, SUB-Q TISSUE,MUSCLE,BONE,=<20 SQ CM Right 03/28/2018    Procedure: DEBRIDEMENT; SKIN, SUBCUTANEOUS TISSUE, MUSCLE, & BONE FOOT;  Surgeon: Karen Chafe, DPM;  Location: MAIN OR [facility];  Service: Vascular   ??? PR FULL EXCIS 1ST METATARSAL HEAD Right 03/28/2018    Procedure: OSTECTOMY COMPLT EXC; FIRST METATARSAL HEAD;  Surgeon: Karen Chafe, DPM;  Location: MAIN OR [facility];  Service: Vascular   ??? PR I&D OF VULVA/PERINEUM ABSCESS Left 12/13/2018    Procedure: Incision And Drainage Of Vulva Or Perineal Abscess;  Surgeon: Joanie Coddington, MD;  Location: MAIN OR [facility];  Service: Trauma   ??? PR I&D PERIANAL ABSCESS,SUPERFICIAL Left 12/13/2018    Procedure: INCISION AND DRAINAGE, PERIANAL ABSCESS, SUPERFICIAL;  Surgeon: Joanie Coddington, MD;  Location: MAIN OR [facility];  Service: Trauma   ??? PR INCIS/DRAIN THIGH/KNEE ABSCESS,DEEP Left 06/14/2018    Procedure: Deep thigh abscess drainage;  Surgeon: Suella Broad, MD;  Location: MAIN OR [facility];  Service: Trauma   ??? PR SECD CLOS SURG WND EXTEN/COMPLIC Right 02/28/2018    Procedure: SECONDARY CLOSURE OF SURGICAL WOUND OR DEHISCENCE, EXTENSIVE OR COMPLICATED;  Surgeon: Karen Chafe, DPM;  Location: MAIN OR [facility];  Service: Vascular       No current facility-administered medications for this encounter. Current Outpatient Medications:   ???  acetaminophen (TYLENOL) 500 MG tablet, Take 500 mg by mouth Three (3) times a day as needed for pain., Disp: , Rfl:   ???  ARIPiprazole (ABILIFY) 5 MG tablet, Take 5 mg by mouth daily., Disp: , Rfl:   ???  atorvastatin (LIPITOR) 80 MG tablet, Take 1 tablet (80 mg total) by mouth every evening., Disp: 90 tablet, Rfl: 1  ???  blood sugar diagnostic (ACCU-CHEK GUIDE) Strp, by Other route Four (4) times a day (before meals and nightly). Acuu-chek Guide., Disp: 120 each, Rfl: 5  ???  busPIRone (BUSPAR) 10 MG tablet, Take 10 mg by mouth Two (2) times a day. , Disp: , Rfl:   ???  calcium-vitamin D 250-100 mg-unit per tablet, Take 1 tablet by mouth Two (2) times a day., Disp: 60 tablet, Rfl: 11  ???  diclofenac sodium (VOLTAREN) 1 % gel, Apply 2 g topically nightly., Disp: , Rfl:   ???  empty container Misc, Use as directed, Disp: 1 each, Rfl: PRN  ???  FLUoxetine (  PROZAC) 20 MG capsule, Take 20 mg by mouth daily. , Disp: , Rfl:   ???  folic acid (FOLVITE) 1 MG tablet, Take 1 tablet (1 mg total) by mouth daily., Disp: 90 tablet, Rfl: 3  ???  GI cocktail, Take 15 mL by mouth every eight (8) hours as needed (nausea, abd pain) for up to 21 days., Disp: 15 mL, Rfl: 0  ???  insulin degludec (TRESIBA FLEXTOUCH U-200) 200 unit/mL (3 mL) InPn, Use daily as directed by MD - start with 65 units daily, up to 100 units daily., Disp: 18 mL, Rfl: 4  ???  insulin lispro (HUMALOG U-100 INSULIN) 100 unit/mL injection, INJECT 0.20ML (20 UNITS TOTAL) UNDER THE SKIN THREE TIMES A DAY BEFORE MEALS., Disp: 50 mL, Rfl: 1  ???  insulin syringes, disposable, 1 mL Syrg, 100 Units by Miscellaneous route daily. Insulin injecting diabetes 250.00 Inject daily 100 unit syringe (1ml), Disp: 100 each, Rfl: 3  ???  levothyroxine (SYNTHROID) 88 MCG tablet, TAKE 1 TABLET BY MOUTH EVERY DAY, Disp: 90 tablet, Rfl: 1  ???  LYRICA 150 mg capsule, Take 150 mg by mouth Three (3) times a day., Disp: , Rfl: 0 ???  methotrexate 25 mg/mL injection solution, Inject 1 mL (25 mg total) under the skin once a week., Disp: 10 mL, Rfl: 3  ???  nystatin (MYCOSTATIN) 100,000 unit/gram powder, Apply 1 application topically 4 (four) times a day as needed., Disp: , Rfl:   ???  nystatin (MYCOSTATIN) 100,000 unit/mL suspension, Take 5 mL (500,000 Units total) by mouth Four (4) times a day., Disp: 60 mL, Rfl: 0  ???  ondansetron (ZOFRAN-ODT) 4 MG disintegrating tablet, Take 1 tablet (4 mg total) by mouth every eight (8) hours as needed for nausea for up to 7 days., Disp: 14 tablet, Rfl: 0  ???  oxyCODONE (ROXICODONE) 5 MG immediate release tablet, Take 1 tablet (5 mg total) by mouth every four (4) hours as needed for up to 5 doses., Disp: 10 tablet, Rfl: 0  ???  pramipexole (MIRAPEX) 0.125 MG tablet, Take 1 tablet (0.125 mg total) by mouth nightly. Can increase to 2 tablets (0.250 mg) in one week if one tablet is not effective., Disp: 60 tablet, Rfl: 1  ???  predniSONE (DELTASONE) 5 MG tablet, Take 20 mg daily x 2 weeks, 15 mg daily x 2 weeks, 10 mg daily x 2 weeks, then 5 mg until visit with rheumatology, Disp: 182 tablet, Rfl: 1  ???  promethazine (PHENERGAN) 25 MG suppository, Insert 1 suppository (25 mg total) into the rectum every six (6) hours as needed for nausea for up to 7 days., Disp: 12 suppository, Rfl: 0  ???  QUEtiapine (SEROQUEL) 300 MG tablet, Take 1 tablet (300 mg total) by mouth nightly., Disp: 90 tablet, Rfl: 3  ???  senna (SENOKOT) 8.6 mg tablet, Take 2 tablets by mouth nightly as needed for constipation., Disp: 60 tablet, Rfl: 0  ???  solifenacin (VESICARE) 5 MG tablet, Take 1 tablet (5 mg total) by mouth daily., Disp: 30 tablet, Rfl: 12  ???  tocilizumab (ACTEMRA ACTPEN) 162 mg/0.9 mL PnIj, Inject the contents of 1 pen (162 mg) under the skin every seven (7) days., Disp: 10.8 mL, Rfl: 3  ???  zolpidem (AMBIEN) 5 MG tablet, Take 5 mg by mouth nightly as needed for sleep., Disp: , Rfl:     Allergies Cephalexin, Doxycycline, Sulfa (sulfonamide antibiotics), Codeine, Darvocet a500  [propoxyphene n-acetaminophen], Hydrocodone, Meropenem, and Sulfasalazine    Family History  Problem Relation Age of Onset   ??? Cancer Maternal Grandmother         Lung cancer   ??? Diabetes Maternal Grandmother    ??? Rheum arthritis Sister    ??? GU problems Neg Hx    ??? Kidney cancer Neg Hx    ??? Prostate cancer Neg Hx        Social History  Social History     Tobacco Use   ??? Smoking status: Former Smoker     Packs/day: 2.00     Years: 27.00     Pack years: 54.00     Types: Cigarettes     Quit date: 2003     Years since quitting: 17.8   ??? Smokeless tobacco: Former Neurosurgeon     Quit date: 2003   ??? Tobacco comment: Started smoking at 89, quit 2003.    Substance Use Topics   ??? Alcohol use: Yes     Binge frequency: Less than monthly     Comment: rarely   maybe 2 beers a year   ??? Drug use: No       Review of Systems:  All other systems have been reviewed and are negative except as otherwise documented in the HPI.      Physical Exam     ED Triage Vitals [02/17/19 1110]   Enc Vitals Group      BP 160/118      Heart Rate 80      SpO2 Pulse       Resp 20      Temp 36.3 ??C (97.4 ??F)      Temp Source Oral      SpO2 95 %     Constitutional: In no acute distress.  Eyes: Conjunctivae are normal.  EOMI.  HEENT:       Head: Normocephalic and atraumatic.       Nose: No epistaxis or congestion.       Mouth/Throat: Clear oropharynx. Moist mucous membranes. No stertor.       Neck: full range of motion, no stridor.  Cardiovascular: Regular rate and rhythm. No murmurs, rubs or gallops. 2+ and symmetric radial pulses.  Respiratory: No increased work of breathing. Clear to auscultation bilaterally.  Gastrointestinal: Obese, soft, non-tender, non-distended. Healing wound over the LLQ with mild 2 x 1.5cm induration with no purulent drainage, eschar or necrosis.  Musculoskeletal: No ecchymosis, cyanosis or edema. Neurologic: Normal speech and language. No gross focal neurologic deficits are appreciated.  Skin: Well-healed abdominal surgical incision. Skin is warm, dry and intact. No acute rash noted  Psychiatric: Intermittently tearful.  Denies SI, HI, AVH.    Radiology     No studies today.  Patient with recent CT abdomen/pelvis with similar, chronic changes.  KUB yesterday was unremarkable.       Documentation assistance was provided by Beacher May, Scribe, on February 17, 2019 at 11:35 AM for Delma Post, MD.    Documentation assistance was provided by the scribe in my presence.  The documentation recorded by the scribe has been reviewed by me and accurately reflects the services I personally performed.           Robbi Garter, MD  Resident  02/17/19 [phone]

## 2019-02-18 MED ORDER — LEVOTHYROXINE 88 MCG TABLET
ORAL_TABLET | Freq: Every day | ORAL | 1 refills | 90.00000 days | Status: CP
Start: 2019-02-18 — End: ?

## 2019-02-20 ENCOUNTER — Telehealth: Payer: Self-pay | Admitting: Pain Medicine

## 2019-02-20 ENCOUNTER — Other Ambulatory Visit: Payer: Self-pay | Admitting: Psychiatry

## 2019-02-20 NOTE — Telephone Encounter (Signed)
Patient states she has been sick and had to go to hospital Friday and Saturday . Was given both finegran, and zofran.

## 2019-02-21 MED FILL — ACTEMRA ACTPEN 162 MG/0.9 ML SUBCUTANEOUS PEN INJECTOR: 28 days supply | Qty: 4 | Fill #3 | Status: AC

## 2019-02-21 MED FILL — ACTEMRA ACTPEN 162 MG/0.9 ML SUBCUTANEOUS PEN INJECTOR: SUBCUTANEOUS | 28 days supply | Qty: 3.6 | Fill #3
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
## 2019-02-23 NOTE — Unmapped (Signed)
Negative COVID19 result received.  Patient notified via given results in ED per provider notes.

## 2019-02-26 ENCOUNTER — Ambulatory Visit: Admit: 2019-02-26 | Discharge: 2019-02-27 | Payer: MEDICAID | Attending: Family | Primary: Family

## 2019-02-26 DIAGNOSIS — R112 Nausea with vomiting, unspecified: Principal | ICD-10-CM

## 2019-02-26 DIAGNOSIS — K3184 Gastroparesis: Principal | ICD-10-CM

## 2019-02-26 MED ORDER — GI COCKTAIL
Freq: Three times a day (TID) | ORAL | 0 refills | 22.00000 days | Status: CP | PRN
Start: 2019-02-26 — End: 2019-03-19

## 2019-02-26 MED ORDER — PROMETHAZINE 25 MG RECTAL SUPPOSITORY
Freq: Four times a day (QID) | RECTAL | 1 refills | 3 days | Status: CP | PRN
Start: 2019-02-26 — End: ?

## 2019-02-26 MED ORDER — PROMETHAZINE 25 MG TABLET
ORAL_TABLET | Freq: Four times a day (QID) | ORAL | 1 refills | 8.00000 days | Status: CP | PRN
Start: 2019-02-26 — End: ?

## 2019-02-26 NOTE — Unmapped (Signed)
ED Progress Note    Procedure performed (I&D). I was present during the key portion of the procedure. Resident Dr. Earvin Hansen performed procedure.

## 2019-02-26 NOTE — Unmapped (Signed)
Patient Education        Gastroparesis: Care Instructions  Your Care Instructions     When you have gastroparesis, your stomach takes a lot longer to empty. This delay can cause belly pain, bloating, and belching. It also can cause hiccups, heartburn, nausea or vomiting. You may not feel like eating. These symptoms may come and go. They most often occur during and after meals. You may feel full after only a few bites of food.  This condition occurs when the nerves to the stomach don't work properly. Diabetes is the most common cause of this nerve damage. Gastroparesis can make it harder to control your blood sugar levels. But keeping your blood sugar levels under control may help with your symptoms. Parkinson's disease, stroke, and some medicines can also cause this condition.  Home treatment can often help.  Follow-up care is a key part of your treatment and safety. Be sure to make and go to all appointments, and call your doctor if you are having problems. It's also a good idea to know your test results and keep a list of the medicines you take.  How can you care for yourself at home?  ?? Eat several small meals each day rather than three large meals.  ?? Eat foods that are low in fiber and fat.  ?? If your doctor suggests it, take medicines that help the stomach empty more quickly. These are called motility agents.  When should you call for help?   Call your doctor now or seek immediate medical care if:  ?? ?? You are vomiting.   ?? ?? You have new or worse belly pain.   ?? ?? You have a fever.   ?? ?? You cannot pass stools or gas.   Watch closely for changes in your health, and be sure to contact your doctor if you have any problems.  Where can you learn more?  Go to [facility] at [email or web address].[email or web address]  Select Patient Education under [employer]. Enter M106 in the search box to learn more about Gastroparesis: Care Instructions.  Current as of: August 02, 2018??????????????????????????????Content Version: 12.6 ?? 2006-2020 [employer].   Care instructions adapted under license by [facility]. If you have questions about a medical condition or this instruction, always ask your healthcare professional. [employer] Inc[employer] disclaims any warranty or liability for your use of this information.

## 2019-03-08 DIAGNOSIS — G2581 Restless legs syndrome: Principal | ICD-10-CM

## 2019-03-08 DIAGNOSIS — R252 Cramp and spasm: Principal | ICD-10-CM

## 2019-03-08 NOTE — Unmapped (Signed)
error 

## 2019-03-12 ENCOUNTER — Ambulatory Visit
Admit: 2019-03-12 | Discharge: 2019-03-12 | Disposition: A | Discharge status: Home / Self Care | Payer: MEDICAID | Attending: Emergency Medicine

## 2019-03-12 DIAGNOSIS — R112 Nausea with vomiting, unspecified: Principal | ICD-10-CM

## 2019-03-12 DIAGNOSIS — R739 Hyperglycemia, unspecified: Principal | ICD-10-CM

## 2019-03-12 LAB — COMPREHENSIVE METABOLIC PANEL
ALBUMIN: 4.1 g/dL (ref 3.5–5.0)
ALT (SGPT): 24 U/L (ref <35)
ANION GAP: 6 mmol/L — ABNORMAL LOW (ref 7–15)
AST (SGOT): 32 U/L (ref 14–38)
BILIRUBIN TOTAL: 1 mg/dL (ref 0.0–1.2)
BLOOD UREA NITROGEN: 11 mg/dL (ref 7–21)
BUN / CREAT RATIO: 11
CALCIUM: 9.7 mg/dL (ref 8.5–10.2)
CHLORIDE: 100 mmol/L (ref 98–107)
CO2: 29 mmol/L (ref 22.0–30.0)
CREATININE: 0.96 mg/dL (ref 0.60–1.00)
EGFR CKD-EPI NON-AA FEMALE: 66 mL/min/{1.73_m2} (ref >=60)
GLUCOSE RANDOM: 385 mg/dL — ABNORMAL HIGH (ref 70–179)
POTASSIUM: 4.7 mmol/L (ref 3.5–5.0)
PROTEIN TOTAL: 7.8 g/dL (ref 6.5–8.3)
SODIUM: 135 mmol/L (ref 135–145)

## 2019-03-12 LAB — CALCIUM: Calcium:MCnc:Pt:Ser/Plas:Qn:: 9.7

## 2019-03-12 LAB — CBC W/ AUTO DIFF
BASOPHILS ABSOLUTE COUNT: 0 10*9/L (ref 0.0–0.1)
BASOPHILS RELATIVE PERCENT: 0.5 %
EOSINOPHILS ABSOLUTE COUNT: 0 10*9/L (ref 0.0–0.4)
EOSINOPHILS RELATIVE PERCENT: 0.2 %
HEMATOCRIT: 35.4 % — ABNORMAL LOW (ref 36.0–46.0)
HEMOGLOBIN: 12 g/dL — ABNORMAL LOW (ref 13.5–16.0)
LARGE UNSTAINED CELLS: 1 % (ref 0–4)
LYMPHOCYTES ABSOLUTE COUNT: 1.1 10*9/L — ABNORMAL LOW (ref 1.5–5.0)
LYMPHOCYTES RELATIVE PERCENT: 17.3 %
MEAN CORPUSCULAR HEMOGLOBIN CONC: 34 g/dL (ref 31.0–37.0)
MEAN CORPUSCULAR HEMOGLOBIN: 27.3 pg (ref 26.0–34.0)
MEAN CORPUSCULAR VOLUME: 80.4 fL (ref 80.0–100.0)
MEAN PLATELET VOLUME: 7.7 fL (ref 7.0–10.0)
MONOCYTES ABSOLUTE COUNT: 0.2 10*9/L (ref 0.2–0.8)
MONOCYTES RELATIVE PERCENT: 2.5 %
NEUTROPHILS ABSOLUTE COUNT: 5 10*9/L (ref 2.0–7.5)
PLATELET COUNT: 181 10*9/L (ref 150–440)
RED BLOOD CELL COUNT: 4.4 10*12/L (ref 4.00–5.20)
RED CELL DISTRIBUTION WIDTH: 16.1 % — ABNORMAL HIGH (ref 12.0–15.0)
WBC ADJUSTED: 6.3 10*9/L (ref 4.5–11.0)

## 2019-03-12 LAB — MAGNESIUM: Magnesium:MCnc:Pt:Ser/Plas:Qn:: 1.7

## 2019-03-12 LAB — LIPASE: Triacylglycerol lipase:CCnc:Pt:Ser/Plas:Qn:: 45

## 2019-03-12 LAB — MEAN CORPUSCULAR VOLUME: Lab: 80.4

## 2019-03-12 LAB — TROPONIN I: Troponin I.cardiac:MCnc:Pt:Ser/Plas:Qn:: <0.06

## 2019-03-12 NOTE — Unmapped (Signed)
[facility]  Emergency Department Provider Note       ED Clinical Impression     Final diagnoses:   Non-intractable vomiting with nausea, unspecified vomiting type (Primary)   Hyperglycemia        Impression, ED Course, Assessment and Plan     Patient is a 58 y.o. female with a PMH of T2DM (blood sugar currently runs around 200), CHF, multiple abdominal surgeries for hernias, cholecystectomy, HLD, hypothyroidism, obesity, and sleep apnea, who is presenting to the ED today for evaluation of nausea and bilious vomiting since 7:00 AM today.    On exam, the patient appears well and is in NAD. VS are WNL. HRRR. LCTAB. Abdomen is soft, nontender, and nondistended.  Negative Murphy's.  Negative McBurney's point tenderness palpation.    Differential includes gastroparesis, low suspicion for acute intraabdominal pathology such as appendicitis, cholecystitis, intra-abdominal abscess given no abdominal pain, no abdominal tenderness to palpation. Low suspicion for obstruction given that she is not obstipated or constipated. Low suspicion for cardiac equivalent pathology given her history of prior examinations, she is diabetic, and she has no complaints of chest pain or shortness of breath.  Consider electrolyte disturbance.  Consider pancreatitis/GERD as well however less likely given no abdominal pain.  Not consistent with hernia complication.    Plan for EKG and basic labs including troponin, lipase, and magnesium. The patient's last QTC was 480 ms so will give the patient haldol in addition to zofran and fluids.    4:02 PM  Patient was re-evaluated and she was sitting in bed complaining of continued nausea.  Will trial Ativan    5:10 PM I re-evaluated the patient and she states that she feels much improved.  She is sitting up in bed drinking fluids. I had a lengthy discussion with the patient regarding the results of the rest of her labs and she said that she would prefer not to wait for the chemistry panel to result. I told her that I would call her if the labs were abnormal. I discussed my proposed outpatient treatment plan. We have discussed anticipatory guidance, scheduled follow-up, and careful return precautions. I provided an opportunity and answered any questions the patient had. The patient express understanding and are comfortable with the discharge plan.      Additional Medical Decision Making     I independently visualized the EKG tracing.   I reviewed the patient's prior medical records.    Labs and radiology results that were available during my care of the patient were independently reviewed by me and considered in my medical decision making.    Portions of this record have been created using Scientist, clinical (histocompatibility and immunogenetics). Dictation errors have been sought, but may not have been identified and corrected.  ____________________________________________    Time seen: March 12, 2019 3:22 PM     History     Chief Complaint  Nausea      HPI   Amanda Hanson is a 58 y.o. female with a PMH of T2DM (blood sugar currently runs around 200), CHF, multiple abdominal surgeries for hernias, cholecystectomy, HLD, hypothyroidism, obesity, and sleep apnea, who is presenting to the ED today for evaluation of nausea and bilious vomiting since 7:00 AM today. She was recently admitted 10/25-10/27 for RLQ abdominal pain, nausea, vomiting, and diarrhea. Her imaging showed no appendicitis or changes to known ventral hernias. No exact cause of her symptoms were identified, although they improved throughout the hospital course and are attributed to  possible viral gastroenteritis. She presented to the ED on 10/30 for similar symptoms, during which visit her KUB showed no obstruction and her symptoms improved with a GI cocktail. She was given a referral to GI when she was discharged. She returned to the ED the following day on 10/31 for recurrence of her symptoms. Her repeat evaluation showed a creatinine of 1.30, but was otherwise reassuring. She was given prescriptions for zofran and phenergan at discharge. She followed up with her PCP on 11/9 at which time her symptoms were attributed to gastroparesis and she was prescribed a GI cocktail, instruction to continue taking Phenergan, and a GI referral - which she accidentally missed. She is currently scheduled to see a GI specialist on 03/21/2019.    She has a history of constipation and has been taking stool softeners to resolve this. In addition to stool softeners, she is currently taking rectal phenergan and pill phenergan. Denies eating anything suspicious. Denies smoking marijuana. Denies fever, chills, sore throat, cough, SOB, CP, abdominal pain, numbness, tingling, hematemesis, diarrhea, or hematochezia. Denies any recent sick contacts, COVID+ contacts, or recent travel. She tested negative for COVID-19 on 02/23/19.  Denies any drugs or alcohol, specifically in regards to marijuana.    Past Medical History:   Diagnosis Date   ??? Abnormal mammogram    ??? Anxiety disorder    ??? Arthritis    ??? Congestive heart failure (CHF) (CMS-HCC)    ??? DDD (degenerative disc disease)    ??? Diabetes mellitus type 2 in obese (CMS-HCC) 2011   ??? GERD (gastroesophageal reflux disease)    ??? Hyperlipidemia    ??? Hypothyroidism ??? Mixed incontinence urge and stress (female)(female)    ??? Obesity    ??? Other functional disorder of bladder    ??? Rheumatoid arthritis(714.0)    ??? Sleep apnea    ??? Urinary frequency    ??? Urinary obstruction, not elsewhere classified        Past Surgical History:   Procedure Laterality Date   ??? BREAST BIOPSY Left 5 15 2017    with clip   ??? CHOLECYSTECTOMY     ??? Cystoscopy with Hydrodistention      11/2004   ??? HERNIA REPAIR     ??? HYSTERECTOMY  2000   ??? Interstim Placement      02/2005   ??? LAPAROSCOPIC GASTRIC BANDING     ??? OOPHORECTOMY Bilateral 2000   ??? PR AMPUTATION METATARSAL+TOE,SINGLE Right 08/24/2018    Procedure: PRIORITY 2nd ray amputation;  Surgeon: Karen Chafe, DPM;  Location: MAIN OR [name];  Service: Vascular   ??? PR AMPUTATION TOE,MT-P JT Right 02/23/2018    Procedure: AMPUTATION, TOE; METATARSOPHALANGEAL JOINT;  Surgeon: Karen Chafe, DPM;  Location: MAIN OR [facility];  Service: Vascular   ??? PR AMPUTATION TOE,MT-P JT Right 08/02/2018    Procedure: AMPUTATION, TOE; METATARSOPHALANGEAL JOINT;  Surgeon: Webb Silversmith, MD;  Location: MAIN OR [facility];  Service: Vascular   ??? PR DEBRIDEMENT, SKIN, SUB-Q TISSUE,=<20 SQ CM Right 11/15/2017    Procedure: Debridement of right groin/perineal wound;  Surgeon: Joanie Coddington, MD;  Location: MAIN OR [facility];  Service: Trauma   ??? PR DEBRIDEMENT, SKIN, SUB-Q TISSUE,MUSCLE,BONE,=<20 SQ CM Right 02/28/2018    Procedure: DEBRIDEMENT; SKIN, SUBCUTANEOUS TISSUE, MUSCLE, & BONE FOOT;  Surgeon: Karen Chafe, DPM;  Location: MAIN OR [facility];  Service: Vascular   ??? PR DEBRIDEMENT, SKIN, SUB-Q TISSUE,MUSCLE,BONE,=<20 SQ CM Right 03/28/2018    Procedure: DEBRIDEMENT; SKIN, SUBCUTANEOUS TISSUE,  MUSCLE, & BONE FOOT;  Surgeon: Karen Chafe, DPM;  Location: MAIN OR [facility];  Service: Vascular   ??? PR FULL EXCIS 1ST METATARSAL HEAD Right 03/28/2018 Procedure: OSTECTOMY COMPLT EXC; FIRST METATARSAL HEAD;  Surgeon: Karen Chafe, DPM;  Location: MAIN OR [facility];  Service: Vascular   ??? PR I&D OF VULVA/PERINEUM ABSCESS Left 12/13/2018    Procedure: Incision And Drainage Of Vulva Or Perineal Abscess;  Surgeon: Joanie Coddington, MD;  Location: MAIN OR [facility];  Service: Trauma   ??? PR I&D PERIANAL ABSCESS,SUPERFICIAL Left 12/13/2018    Procedure: INCISION AND DRAINAGE, PERIANAL ABSCESS, SUPERFICIAL;  Surgeon: Joanie Coddington, MD;  Location: MAIN OR [facility]  Service: Trauma   ??? PR INCIS/DRAIN THIGH/KNEE ABSCESS,DEEP Left 06/14/2018    Procedure: Deep thigh abscess drainage;  Surgeon: Suella Broad, MD;  Location: MA[facility];  Service: Trauma   ??? PR SECD CLOS SURG WND EXTEN/COMPLIC Right 02/28/2018    Procedure: SECONDARY CLOSURE OF SURGICAL WOUND OR DEHISCENCE, EXTENSIVE OR COMPLICATED;  Surgeon: Karen Chafe, DPM;  Location: MAIN OR [facility];  Service: Vascular       No current facility-administered medications for this encounter.     Current Outpatient Medications:   ???  acetaminophen (TYLENOL) 500 MG tablet, Take 500 mg by mouth Three (3) times a day as needed for pain., Disp: , Rfl:   ???  ARIPiprazole (ABILIFY) 5 MG tablet, Take 5 mg by mouth daily., Disp: , Rfl:   ???  atorvastatin (LIPITOR) 80 MG tablet, Take 1 tablet (80 mg total) by mouth every evening., Disp: 90 tablet, Rfl: 1  ???  blood sugar diagnostic (ACCU-CHEK GUIDE) Strp, by Other route Four (4) times a day (before meals and nightly). Acuu-chek Guide., Disp: 120 each, Rfl: 5  ???  busPIRone (BUSPAR) 10 MG tablet, Take 10 mg by mouth Two (2) times a day. , Disp: , Rfl:   ???  calcium-vitamin D 250-100 mg-unit per tablet, Take 1 tablet by mouth Two (2) times a day., Disp: 60 tablet, Rfl: 11  ???  diclofenac sodium (VOLTAREN) 1 % gel, Apply 2 g topically nightly., Disp: , Rfl:   ???  empty container Misc, Use as directed, Disp: 1 each, Rfl: PRN ???  FLUoxetine (PROZAC) 20 MG capsule, Take 20 mg by mouth daily. , Disp: , Rfl:   ???  folic acid (FOLVITE) 1 MG tablet, Take 1 tablet (1 mg total) by mouth daily., Disp: 90 tablet, Rfl: 3  ???  GI cocktail, Take 15 mL by mouth every eight (8) hours as needed (nausea, abd pain) for up to 21 days., Disp: 1000 mL, Rfl: 0  ???  insulin degludec (TRESIBA FLEXTOUCH U-200) 200 unit/mL (3 mL) InPn, Use daily as directed by MD - start with 65 units daily, up to 100 units daily., Disp: 18 mL, Rfl: 4  ???  insulin lispro (HUMALOG U-100 INSULIN) 100 unit/mL injection, INJECT 0.20ML (20 UNITS TOTAL) UNDER THE SKIN THREE TIMES A DAY BEFORE MEALS., Disp: 50 mL, Rfl: 1  ???  insulin syringes, disposable, 1 mL Syrg, 100 Units by Miscellaneous route daily. Insulin injecting diabetes 250.00 Inject daily 100 unit syringe (1ml), Disp: 100 each, Rfl: 3  ???  levothyroxine (SYNTHROID) 88 MCG tablet, Take 1 tablet (88 mcg total) by mouth daily., Disp: 90 tablet, Rfl: 1  ???  LYRICA 150 mg capsule, Take 150 mg by mouth Three (3) times a day., Disp: , Rfl: 0  ???  methotrexate 25 mg/mL  injection solution, Inject 1 mL (25 mg total) under the skin once a week., Disp: 10 mL, Rfl: 3  ???  nystatin (MYCOSTATIN) 100,000 unit/gram powder, Apply 1 application topically 4 (four) times a day as needed., Disp: , Rfl:   ???  nystatin (MYCOSTATIN) 100,000 unit/mL suspension, Take 5 mL (500,000 Units total) by mouth Four (4) times a day., Disp: 60 mL, Rfl: 0  ???  oxyCODONE (ROXICODONE) 5 MG immediate release tablet, Take 1 tablet (5 mg total) by mouth every four (4) hours as needed for up to 5 doses., Disp: 10 tablet, Rfl: 0  ???  pramipexole (MIRAPEX) 0.125 MG tablet, Take 1 tablet (0.125 mg total) by mouth nightly. Can increase to 2 tablets (0.250 mg) in one week if one tablet is not effective., Disp: 60 tablet, Rfl: 1 ???  predniSONE (DELTASONE) 5 MG tablet, Take 20 mg daily x 2 weeks, 15 mg daily x 2 weeks, 10 mg daily x 2 weeks, then 5 mg until visit with rheumatology, Disp: 182 tablet, Rfl: 1  ???  promethazine (PHENERGAN) 25 MG suppository, Insert 1 suppository (25 mg total) into the rectum every six (6) hours as needed for nausea. May use suppository if unable to tolerate PO., Disp: 12 suppository, Rfl: 1  ???  promethazine (PHENERGAN) 25 MG tablet, Take 1 tablet (25 mg total) by mouth every six (6) hours as needed for nausea., Disp: 30 tablet, Rfl: 1  ???  QUEtiapine (SEROQUEL) 300 MG tablet, Take 1 tablet (300 mg total) by mouth nightly., Disp: 90 tablet, Rfl: 3  ???  senna (SENOKOT) 8.6 mg tablet, Take 2 tablets by mouth nightly as needed for constipation., Disp: 60 tablet, Rfl: 0  ???  solifenacin (VESICARE) 5 MG tablet, Take 1 tablet (5 mg total) by mouth daily., Disp: 30 tablet, Rfl: 12  ???  tocilizumab (ACTEMRA ACTPEN) 162 mg/0.9 mL PnIj, Inject the contents of 1 pen (162 mg) under the skin every seven (7) days., Disp: 10.8 mL, Rfl: 3  ???  zolpidem (AMBIEN) 5 MG tablet, Take 5 mg by mouth nightly as needed for sleep., Disp: , Rfl:     Allergies  Cephalexin, Doxycycline, Sulfa (sulfonamide antibiotics), Codeine, Darvocet a500  [propoxyphene n-acetaminophen], Hydrocodone, Meropenem, and Sulfasalazine    Family History   Problem Relation Age of Onset   ??? Cancer Maternal Grandmother         Lung cancer   ??? Diabetes Maternal Grandmother    ??? Rheum arthritis Sister    ??? GU problems Neg Hx    ??? Kidney cancer Neg Hx    ??? Prostate cancer Neg Hx        Social History  Social History     Tobacco Use   ??? Smoking status: Former Smoker     Packs/day: 2.00     Years: 27.00     Pack years: 54.00     Types: Cigarettes     Quit date: 2003     Years since quitting: 17.9   ??? Smokeless tobacco: Former Neurosurgeon     Quit date: 2003   ??? Tobacco comment: Started smoking at 70, quit 2003.    Substance Use Topics   ??? Alcohol use: Yes Binge frequency: Less than monthly     Comment: rarely   maybe 2 beers a year   ??? Drug use: No         Review of Systems    Constitutional: Negative for fever or chills.  Eyes: Negative for visual  changes.  ENT: Negative for sore throat.  Cardiovascular: Negative for chest pain.  Respiratory: Negative for shortness of breath.  Gastrointestinal: Positive for nausea and bilious vomiting. Negative for abdominal pain, hematemesis, diarrhea, or hematochezia.  Genitourinary: Negative for dysuria.  Musculoskeletal: Negative for back pain.  Skin: Negative for rash.  Neurological: Negative for headaches, weakness or numbness.  10-point ROS reviewed and otherwise negative except as documented     Physical Exam     VITAL SIGNS:    ED Triage Vitals [03/12/19 1332]   Enc Vitals Group      BP 148/91      Heart Rate 90      SpO2 Pulse       Resp 16      Temp 36.8 ??C (98.2 ??F)      Temp Source Oral      SpO2 98 %      Weight (!) 149.7 kg (330 lb)      Height 1.676 m (5' 6)     Constitutional: Alert and oriented. Well appearing and in no distress.  Eyes: Conjunctivae are normal.  ENT       Head: Normocephalic and atraumatic.       Nose: No congestion.       Mouth/Throat: Mucous membranes are moist.       Neck: No stridor.  Cardiovascular: Normal rate, regular rhythm.   Respiratory: Normal respiratory effort. Breath sounds are normal.  Gastrointestinal: Soft and nontender, non-distended  Musculoskeletal: No lower extremity tenderness or edema.  Neurologic: Normal speech and language. No gross focal neurologic deficits are appreciated.  Skin: Skin is warm, dry and intact. No rash noted.  Psychiatric: Mood and affect are normal. Speech and behavior are normal.     EKG     3:59 PM  Last QTC on 02/17/2019 was 480. Normal sinus rhythm at 81 BPM. No ST-elevation or ST-depression. Normlal intervals. QTC is 453. No ectopy. No change from prior EKG. Pertinent labs & imaging results that were available during my care of the patient were reviewed by me and considered in my medical decision making (see chart for details).    Documentation assistance was provided by Horton Finer, Scribe, on March 12, 2019 at 5:15 PM for Carolee Rota, MD.    Documentation assistance was provided by the scribe in my presence.  The documentation recorded by the scribe has been reviewed by me and accurately reflects the services I personally performed.      Carolee Rota, MD  Clinical Assistant Professor  [facility] Department of Emergency Medicine         Berkley Harvey, MD  03/12/19 2031

## 2019-03-12 NOTE — Unmapped (Signed)
Pt complains of nausea since 0700. No other symptoms.

## 2019-03-13 NOTE — Unmapped (Addendum)
1st Attempt: Called Per Dr. Eben Burow request to have Pt's appt rescheduled to 11/30 at 8:30am. No answer Left VM.       ----- Message from Tommy Rainwater, MD sent at 03/12/2019 11:07 PM EST -----  Regarding: re-scheduling patient  Hi Marylene Land,    Ms. Tourville was in the [facility] ED today and thus did not come for her virtual video appointment. If she is able, we can schedule her for either 3:30 PM or 8:30 AM for a video visit on 11/30.     Thank you,    Shruti

## 2019-03-13 NOTE — Unmapped (Signed)
Opened encounter for visit today; however, patient did not come to video visit and did not pick up her cell phone. Per chart, currently in [facility] ED for abdominal symptoms. Patient was no-show to today's appointment and will be rescheduled for a later date.    Attending Dr. Scarlette Calico made aware.    Tommy Rainwater, MD, PGY-4  [facility]  [phone]

## 2019-03-13 NOTE — Unmapped (Signed)
[facility] Refill Coordination Note    Specialty Medication(s) to be Shipped:   Inflammatory Disorders: Actemra    Other medication(s) to be shipped:       Amanda Hanson, DOB: [birth year]  Phone: [phone] (home)       All above HIPAA information was verified with patient.     Completed refill call assessment today to schedule patient's medication shipment from the [facility] [phone]).       Specialty medication(s) and dose(s) confirmed: Regimen is correct and unchanged.   Changes to medications: Amanda Hanson reports no changes at this time.  Changes to insurance: No  Questions for the pharmacist: No    Confirmed patient received Welcome Packet with first shipment. The patient will receive a drug information handout for each medication shipped and additional FDA Medication Guides as required.       DISEASE/MEDICATION-SPECIFIC INFORMATION        N/A    SPECIALTY MEDICATION ADHERENCE     Medication Adherence    Patient reported X missed doses in the last month: 1  Specialty Medication: actemra162mg /0.43ml-due to hospitalization  Patient is on additional specialty medications: No  Informant: patient                Actemra 162/0.9 mg/ml: 14 days of medicine on hand         SHIPPING     Shipping address confirmed in Epic.     Delivery Scheduled: Yes, Expected medication delivery date: 12/03.     Medication will be delivered via Same Day Courier to the prescription address in Epic WAM.    Amanda Hanson   [facility] Specialty Technician

## 2019-03-13 NOTE — Unmapped (Signed)
Comprehensive Metabolic Panel  Order: [id number]  Status:  Final result ????    Sent to provider for review and action
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
## 2019-03-21 ENCOUNTER — Ambulatory Visit: Admit: 2019-03-21 | Discharge: 2019-03-22 | Payer: MEDICAID | Attending: Surgery | Primary: Surgery
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
## 2019-03-22 MED ORDER — METHOTREXATE SODIUM 25 MG/ML INJECTION SOLUTION
SUBCUTANEOUS | 3 refills | 70 days | Status: CP
Start: 2019-03-22 — End: ?

## 2019-03-22 MED FILL — ACTEMRA ACTPEN 162 MG/0.9 ML SUBCUTANEOUS PEN INJECTOR: SUBCUTANEOUS | 28 days supply | Qty: 3.6 | Fill #4

## 2019-03-22 MED FILL — ACTEMRA ACTPEN 162 MG/0.9 ML SUBCUTANEOUS PEN INJECTOR: 28 days supply | Qty: 4 | Fill #4 | Status: AC

## 2019-03-22 NOTE — Unmapped (Signed)
Methotrexate and prednisone refill  Last ov: 06/26/2018   Next ov: 05/04/2019     Labs:   AST   Date Value Ref Range Status   03/12/2019 32 14 - 38 U/L Final   12/06/2010 38 14 - 38 U/L Final     ALT   Date Value Ref Range Status   03/12/2019 24 <35 U/L Final   12/06/2010 31 15 - 48 U/L Final     Creatinine   Date Value Ref Range Status   03/12/2019 0.96 0.60 - 1.00 mg/dL Final   45/40/9811 9.14 (L) 0.60 - 1.00 MG/DL Final     WBC   Date Value Ref Range Status   03/12/2019 6.3 4.5 - 11.0 10*9/L Final   12/08/2010 10.2 4.5 - 11.0 x10 9th/L Final     HGB   Date Value Ref Range Status   03/12/2019 12.0 (L) 13.5 - 16.0 g/dL Final   78/29/5621 30.8 12.0 - 16.0 G/DL Final     HCT   Date Value Ref Range Status   03/12/2019 35.4 (L) 36.0 - 46.0 % Final   12/08/2010 37.3 36.0 - 46.0 % Final     MCV   Date Value Ref Range Status   03/12/2019 80.4 80.0 - 100.0 fL Final   12/08/2010 78 (L) 80 - 100 FL Final     RDW   Date Value Ref Range Status   03/12/2019 16.1 (H) 12.0 - 15.0 % Final   12/08/2010 18.3 (H) 12.0 - 15.0 % Final     Platelet   Date Value Ref Range Status   03/12/2019 181 150 - 440 10*9/L Final   12/08/2010 311 150 - 440 x10 9th/L Final     Neutrophils %   Date Value Ref Range Status   03/12/2019 78.8 % Final     Lymphocytes %   Date Value Ref Range Status   03/12/2019 17.3 % Final     Monocytes %   Date Value Ref Range Status   03/12/2019 2.5 % Final     Eosinophils %   Date Value Ref Range Status   03/12/2019 0.2 % Final     Basophils %   Date Value Ref Range Status   03/12/2019 0.5 % Final

## 2019-03-23 ENCOUNTER — Other Ambulatory Visit: Payer: Self-pay

## 2019-03-23 DIAGNOSIS — U071 COVID-19: Secondary | ICD-10-CM

## 2019-03-23 DIAGNOSIS — Z20822 Contact with and (suspected) exposure to covid-19: Secondary | ICD-10-CM

## 2019-03-23 HISTORY — DX: COVID-19: U07.1

## 2019-03-26 ENCOUNTER — Inpatient Hospital Stay
Admission: EM | Admit: 2019-03-26 | Discharge: 2019-03-27 | DRG: 177 | Disposition: A | Discharge status: Other Acute Inpatient Hospital | Payer: Medicaid Other | Attending: Internal Medicine | Admitting: Internal Medicine

## 2019-03-26 ENCOUNTER — Other Ambulatory Visit: Payer: Self-pay

## 2019-03-26 ENCOUNTER — Emergency Department: Payer: Medicaid Other

## 2019-03-26 DIAGNOSIS — E1142 Type 2 diabetes mellitus with diabetic polyneuropathy: Secondary | ICD-10-CM | POA: Diagnosis present

## 2019-03-26 DIAGNOSIS — E039 Hypothyroidism, unspecified: Secondary | ICD-10-CM | POA: Diagnosis present

## 2019-03-26 DIAGNOSIS — F319 Bipolar disorder, unspecified: Secondary | ICD-10-CM | POA: Diagnosis present

## 2019-03-26 DIAGNOSIS — E860 Dehydration: Secondary | ICD-10-CM | POA: Diagnosis present

## 2019-03-26 DIAGNOSIS — U071 COVID-19: Principal | ICD-10-CM | POA: Diagnosis present

## 2019-03-26 DIAGNOSIS — Z79890 Hormone replacement therapy (postmenopausal): Secondary | ICD-10-CM

## 2019-03-26 DIAGNOSIS — Z950 Presence of cardiac pacemaker: Secondary | ICD-10-CM

## 2019-03-26 DIAGNOSIS — Z87891 Personal history of nicotine dependence: Secondary | ICD-10-CM

## 2019-03-26 DIAGNOSIS — N179 Acute kidney failure, unspecified: Secondary | ICD-10-CM

## 2019-03-26 DIAGNOSIS — Z794 Long term (current) use of insulin: Secondary | ICD-10-CM

## 2019-03-26 DIAGNOSIS — E669 Obesity, unspecified: Secondary | ICD-10-CM | POA: Diagnosis present

## 2019-03-26 DIAGNOSIS — Z79899 Other long term (current) drug therapy: Secondary | ICD-10-CM

## 2019-03-26 DIAGNOSIS — Z6841 Body Mass Index (BMI) 40.0 and over, adult: Secondary | ICD-10-CM

## 2019-03-26 DIAGNOSIS — J449 Chronic obstructive pulmonary disease, unspecified: Secondary | ICD-10-CM | POA: Diagnosis present

## 2019-03-26 DIAGNOSIS — R059 Cough, unspecified: Secondary | ICD-10-CM

## 2019-03-26 DIAGNOSIS — N319 Neuromuscular dysfunction of bladder, unspecified: Secondary | ICD-10-CM | POA: Diagnosis present

## 2019-03-26 DIAGNOSIS — E878 Other disorders of electrolyte and fluid balance, not elsewhere classified: Secondary | ICD-10-CM | POA: Diagnosis present

## 2019-03-26 DIAGNOSIS — E1165 Type 2 diabetes mellitus with hyperglycemia: Secondary | ICD-10-CM | POA: Diagnosis present

## 2019-03-26 DIAGNOSIS — E785 Hyperlipidemia, unspecified: Secondary | ICD-10-CM | POA: Diagnosis present

## 2019-03-26 DIAGNOSIS — R531 Weakness: Secondary | ICD-10-CM

## 2019-03-26 DIAGNOSIS — R05 Cough: Secondary | ICD-10-CM

## 2019-03-26 DIAGNOSIS — E871 Hypo-osmolality and hyponatremia: Secondary | ICD-10-CM | POA: Diagnosis present

## 2019-03-26 DIAGNOSIS — I5032 Chronic diastolic (congestive) heart failure: Secondary | ICD-10-CM | POA: Diagnosis present

## 2019-03-26 DIAGNOSIS — M069 Rheumatoid arthritis, unspecified: Secondary | ICD-10-CM | POA: Diagnosis present

## 2019-03-26 DIAGNOSIS — F410 Panic disorder [episodic paroxysmal anxiety] without agoraphobia: Secondary | ICD-10-CM | POA: Diagnosis present

## 2019-03-26 DIAGNOSIS — K219 Gastro-esophageal reflux disease without esophagitis: Secondary | ICD-10-CM | POA: Diagnosis present

## 2019-03-26 DIAGNOSIS — J1289 Other viral pneumonia: Secondary | ICD-10-CM | POA: Diagnosis present

## 2019-03-26 DIAGNOSIS — I110 Hypertensive heart disease with heart failure: Secondary | ICD-10-CM | POA: Diagnosis present

## 2019-03-26 DIAGNOSIS — Z90710 Acquired absence of both cervix and uterus: Secondary | ICD-10-CM

## 2019-03-26 LAB — NOVEL CORONAVIRUS, NAA: SARS-CoV-2, NAA: DETECTED — AB

## 2019-03-26 NOTE — ED Triage Notes (Signed)
Pt arrives to ED via [facility] from home with c/o cough "and I was told I have COVID". Pt states she tested positive "down in [place]". Pt reports non-productive cough, no fever. Pt is falling asleep in Triage and needs to be woken up several times to answer questions.

## 2019-03-26 NOTE — ED Provider Notes (Signed)
[facility] Emergency Department Provider Note  ____________________________________________   First MD Initiated Contact with Patient 03/26/19 2316     (approximate)  I have reviewed the triage vital signs and the nursing notes.  History  Chief Complaint Cough    HPI ROSHUNDA KEIR is a 58 y.o. female diastolic HF, HTN, DM, on chronic opiates who presents for "being sick." Patient states she is "sick" but cannot clarify this any further. When asked to specify, she states, "I have symptoms."  She complains of diffuse myalgias and fatigue.  On chart review, patient swabbed positive for COVID on 03/23/19.   Caveat: hx extremely limited as patient is an extremely poor historian.    Past Medical Hx Past Medical History:  Diagnosis Date  . Abdominal wall hernia 01/29/2013  . Anxiety   . Arthritis    Rheumatoid  . C. difficile colitis   . Chronic diastolic heart failure ([place])   . Depression   . Diabetes mellitus    states no meds or diet restrictions  at present  . Diastolic CHF ([place])   . Esophagitis   . Fluid retention   . GERD (gastroesophageal reflux disease)   . Hiatal hernia   . Hypertension   . Hypokalemia due to loss of potassium 10/21/2015   Overview:  Associated with 3 weeks of diarrhea  And QT prolongation.  . Hypothyroidism   . IBS (irritable bowel syndrome)   . Moderate episode of recurrent major depressive disorder ([place]) 06/03/2004  . Morbid obesity ([place])   . MRSA (methicillin resistant Staphylococcus aureus) infection 11/2017   left inner thigh abcess  . Neurogenic bladder    has pacemaker  . Neuropathy   . Obesity   . Panic attacks   . Rheumatoid arthritis ([place])   . Sleep apnea    STATES SEVERE, CANT TOLERATE MASK- LAST STUDY YEARS AGO    Problem List Patient Active Problem List   Diagnosis Date Noted  . Abdominal pain 02/12/2019  . AKI (acute kidney injury) ([place]) 02/12/2019  . Cellulitis, abdominal wall 02/04/2019  .  Elevated C-reactive protein (CRP) 12/15/2018  . Elevated sed rate 12/15/2018  . Pharmacologic therapy 11/06/2018  . Disorder of skeletal system 11/06/2018  . Problems influencing health status 11/06/2018  . Decubitus ulcer of heel, bilateral 02/21/2018  . Diabetic ulcer of toe of right foot associated with type 2 diabetes mellitus ([place]) 02/14/2018  . Ulcer of left heel and midfoot with fat layer exposed ([place]) 02/14/2018  . MRSA (methicillin resistant Staphylococcus aureus) infection 11/17/2017  . Cellulitis 11/15/2017  . Perineal abscess 11/15/2017  . Subacute vulvitis 11/01/2017  . Chronic cystitis 03/01/2017  . Chronic pain syndrome 03/31/2016  . Insomnia secondary to chronic pain 03/31/2016  . Chronic upper back pain 12/25/2015  . Chronic hand pain (Bilateral) (L>R) 12/25/2015  . Rheumatoid arthritis ([place]) 12/25/2015  . Osteoarthritis, multiple sites 12/24/2015  . Chronic foot pain (Primary Area of Pain) (Bilateral) (L>R) 12/24/2015  . Chronic elbow pain (Third area of Pain) (Bilateral) (L>R) 12/24/2015  . Chronic shoulder pain (Bilateral) (L>R) 12/24/2015  . Chronic neck pain (Bilateral) (R>L) 12/24/2015  . 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[employer]) 12/23/2015  . Chronic knee pain (Bilateral) (R>L) 12/23/2015  . Long term current use of opiate analgesic 12/23/2015  . Long term prescription opiate use 12/23/2015  . Opiate use (30 MME/Day) 12/23/2015  . Neurogenic pain 12/23/2015  . Neuropathic pain 12/23/2015  . Diabetic peripheral neuropathy ([place]) 12/23/2015  . Encounter  for therapeutic drug level monitoring 12/23/2015  . Encounter for pain management planning 12/23/2015  . GERD (gastroesophageal reflux disease) 11/25/2015  . Neuropathy 11/25/2015  . Diarrhea 10/22/2015  . Hypokalemia 10/21/2015  . Hypomagnesemia 10/21/2015  . QT prolongation 10/21/2015  . 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 Chronic wrist pain (Secondary  area of Pain) (Bilateral) (L>R) 03/19/2015  . 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 Female genuine stress incontinence 02/14/2015  . Urge incontinence of urine 02/14/2015  . Obstructive apnea 07/03/2014  . Chronic diastolic CHF (congestive heart failure), NYHA class 3 (HCC) 07/03/2014  . Anxiety 01/03/2014  . Diabetic ulcer of heel (HCC) 01/03/2014  . COPD (chronic obstructive pulmonary disease) (HCC) 01/03/2014  . Bipolar disorder, unspecified (HCC) 01/03/2014  . Diastolic dysfunction 01/03/2014  . Combined fat and carbohydrate induced hyperlipemia 01/03/2014  . Shortness of breath 01/03/2014  . Acute on chronic congestive heart failure (HCC) 01/03/2014  . Incomplete bladder emptying 11/02/2012  . Bladder retention 10/10/2012  . Detrusor muscle hypertonia 10/04/2012  . Obstruction of urinary tract 10/04/2012  . FOM (frequency of micturition) 10/04/2012  . Mixed incontinence 10/04/2012  . Vitamin D insufficiency 04/15/2012  . Borderline personality disorder (HCC) 01/06/2012  . Mixed anxiety depressive disorder 01/06/2012  . History of laparoscopic adjustable gastric banding, 03/20/2007.  Removed 09/19/2011. 08/04/2011  . Nausea & vomiting 08/04/2011  . Hypothyroidism 06/28/2010  . Rheumatoid arthritis flare (HCC) 06/28/2010  . Obesity 03/29/2005  . Essential (primary) hypertension 01/27/2005  . Major depressive disorder, recurrent episode, moderate (HCC) 06/03/2004    Past Surgical Hx Past Surgical History:  Procedure Laterality Date  . ABDOMINAL HYSTERECTOMY    . CHOLECYSTECTOMY    . DG GREAT TOE RIGHT FOOT  02/23/2018  . EYE SURGERY     bilateral cataract extraction with IOL  . HERNIA REPAIR     ventral hernia with strangulation  . LAPAROSCOPIC GASTRIC BANDING  03/20/07  . TONSILLECTOMY    . TUBAL LIGATION      Medications Prior to Admission medications   Medication Sig Start Date End Date Taking? Authorizing Provider  ARIPiprazole (ABILIFY) 5 MG tablet TAKE 1 TABLET BY  MOUTH ONCE DAILY. 02/22/19   Clapacs, Jackquline Denmark, MD  atorvastatin (LIPITOR) 80 MG tablet Take by mouth. 02/09/13   [provider]  B-D INSULIN SYRINGE 1CC/25G 25G X 5/8" 1 ML MISC 1 UNITS BY MISCELLANEOUS ROUTE EVERY SEVEN (7) DAYS. TO BE USED WITH METHOTREXATE 12/10/16   [provider]  busPIRone (BUSPAR) 10 MG tablet TAKE 1 TABLET BY MOUTH TWICE DAILY 02/05/19   Clapacs, Jackquline Denmark, MD  Calcium Carb-Ergocalciferol 250-125 MG-UNIT TABS Take by mouth.    [provider]  Cholecalciferol (VITAMIN D3) 125 MCG (5000 UT) CAPS Take 1 capsule (5,000 Units total) by mouth daily with breakfast. Take along with calcium and magnesium. 02/14/19 02/14/20  Delano Metz, MD  clotrimazole-betamethasone (LOTRISONE) cream Apply 1 application topically 2 (two) times daily. For yeast infection under stomach 05/07/11   [provider]  Diclofenac Sodium 3 % GEL APPLY 1 APPLICATION TOPICALLY TWO (2) TIMES A DAY. 06/21/17   [provider]  FLUoxetine (PROZAC) 20 MG capsule TAKE (1) CAPSULE BY MOUTH ONCE DAILY. 11/30/18   Clapacs, Jackquline Denmark, MD  folic acid (FOLVITE) 1 MG tablet Take 1 mg by mouth daily.     [provider]  hydroxychloroquine (PLAQUENIL) 200 MG tablet Take 200 mg by mouth 2 (two) times daily.    [provider]  hydrOXYzine (ATARAX/VISTARIL) 50 MG tablet Take 50 mg by mouth Twice daily.  07/07/11   [provider]  Insulin Degludec (TRESIBA) 100 UNIT/ML SOLN Inject 65 Units into the skin daily.    [provider]  insulin lispro (HUMALOG) 100 UNIT/ML injection Inject 10u AC TID and add SSI: <150=0u, 151-200=2u, 201-250=4u, 251-300=6u, >300mg /dl=8u. Up to 60u daily. 10/31/17   [provider]  KLOR-CON M20 20 MEQ tablet TAKE ONE TABLET BY MOUTH TWICE DAILY Patient taking differently: take 2 tabs in the a.m. , 1 tab in the evening 04/10/13   [place] OuchArida, Muhammad A, MD  leflunomide (ARAVA) 10 MG tablet Take 10 mg by mouth daily.  04/27/17   [provider]  levothyroxine (SYNTHROID, LEVOTHROID) 88 MCG tablet Take 88 mcg by mouth daily before breakfast.    [provider]  lisinopril (PRINIVIL,ZESTRIL) 2.5 MG tablet Take 2.5 mg by mouth daily.    [provider]  loperamide (IMODIUM) 2 MG capsule Take 2 mg by mouth as needed.  06/21/16   [provider]  lovastatin (MEVACOR) 40 MG tablet Take 40 mg by mouth daily.     [provider]  metFORMIN (GLUCOPHAGE) 500 MG tablet Take 500 mg by mouth 2 (two) times daily with a meal.     [provider]  methotrexate 50 MG/2ML injection Inject into the muscle.    [provider]  oxyCODONE (OXY IR/ROXICODONE) 5 MG immediate release tablet Take 1 tablet (5 mg total) by mouth every 6 (six) hours as needed for severe pain. Must last 30 days 02/14/19 03/16/19  Delano MetzNaveira, Francisco, MD  oxyCODONE (OXY IR/ROXICODONE) 5 MG immediate release tablet Take 1 tablet (5 mg total) by mouth every 6 (six) hours as needed for severe pain. Must last 30 days 03/16/19 04/15/19  Delano MetzNaveira, Francisco, MD  oxyCODONE (OXY IR/ROXICODONE) 5 MG immediate release tablet Take 1 tablet (5 mg total) by mouth every 6 (six) hours as needed for severe pain. Must last 30 days 04/15/19 05/15/19  Delano MetzNaveira, Francisco, MD  phenazopyridine (PYRIDIUM) 200 MG tablet Take 1 tablet (200 mg total) by mouth 3 (three) times daily. 05/16/18   Lutricia Feiloemer, William P, PA-C  pregabalin (LYRICA) 150 MG capsule Take 1 capsule (150 mg total) by mouth 3 (three) times daily. 02/14/19 08/13/19  Delano MetzNaveira, Francisco, MD  promethazine (PHENERGAN) 25 MG tablet TAKE 1 TABLET BY MOUTH EVERY 6 HOURS AS NEEDED NAUSEA OR VOMITING 10/18/17   [provider]  QUEtiapine (SEROQUEL) 300 MG tablet TAKE ONE TABLET BY MOUTH AT BEDTIME. 02/05/19   Clapacs, Jackquline DenmarkJohn T, MD  Vitamin D, Ergocalciferol, (DRISDOL) 50000 units CAPS capsule Take by mouth.    [provider]  zolpidem (AMBIEN) 5 MG tablet TAKE 1  TABLET BY MOUTH EVERYDAY AT BEDTIME 11/30/18   Clapacs, Jackquline DenmarkJohn T, MD    Allergies Codeine, Propoxyphene, Sulfa antibiotics, and Hydrocodone  Family Hx Family History  Problem Relation Age of Onset  . Heart failure Father   . Bipolar disorder Father   . Alcohol abuse Father   . Anxiety disorder Father   . Depression Father   . Heart disease Brother   . Heart attack Brother 1751       MI s/p stents placed  . Anxiety disorder Sister   . Depression Sister   . Anxiety disorder Sister   . Depression Sister   . Bipolar disorder Sister   . Alcohol abuse Sister   . Drug abuse Sister   . Heart attack  Brother     Social Hx Social History   Tobacco Use  . Smoking status: Former Smoker    Packs/day: 2.00    Years: 27.00    Pack years: 54.00    Types: Cigarettes    Quit date: 07/30/1999    Years since quitting: 19.6  . Smokeless tobacco: Never Used  Substance Use Topics  . Alcohol use: No  . Drug use: No     Review of Systems Unable to obtain due to poor historian.    Physical Exam  Vital Signs: ED Triage Vitals  Enc Vitals Group     BP 03/26/19 2304 (!) 121/99     Pulse Rate 03/26/19 2304 90     Resp 03/26/19 2304 17     Temp 03/26/19 2304 99.9 F (37.7 C)     Temp Source 03/26/19 2304 Oral     SpO2 03/26/19 2304 93 %     Weight 03/26/19 2302 280 lb (127 kg)     Height 03/26/19 2302 5\' 6"  (1.676 m)     Head Circumference --      Peak Flow --      Pain Score 03/26/19 2301 1     Pain Loc --      Pain Edu? --      Excl. in GC? --     Constitutional: Falls asleep answering questions. Awakens easily to voice. No respiratory depression.  Head: Normocephalic. Atraumatic. Eyes: Conjunctivae clear. Sclera anicteric.  Pupils midsize and reactive. Nose: No congestion. No rhinorrhea. Mouth/Throat: Wearing mask.  Neck: No stridor.   Cardiovascular: Normal rate. Extremities well perfused. Respiratory: Normal respiratory effort.  Oxygen high 80s on RA, placed on 2-3 L [place].  Gastrointestinal: Soft. Non-tender. Non-distended.  Musculoskeletal: No lower extremity edema.  Neurologic:  Sleepy. No gross focal neurologic deficits are appreciated.  Skin: Skin is warm, dry and intact. No rash noted. Psychiatric: Mood and affect are appropriate for situation.  EKG  Personally reviewed.   Rate: 95 Rhythm: sinus Axis: normal Intervals: WNL No STEMI    Radiology  CXR: IMPRESSION:  Minimal streaky bibasilar opacities. Findings favor atelectasis,  however may represent atypical viral pneumonia in the setting of  COVID-19.    Procedures  Procedure(s) performed (including critical care):  Procedures   Initial Impression / Assessment and Plan / ED Course  58 y.o. female known COVID positive who presents to the ED for "being sick", mildly hypoxic on RA, on [place] currently.  Ddx: COVID related, hypoxia, hypercarbia, opiate use (though no pinpoint pupils or hypercarbia), infection, electrolyte abnormality.   Will obtain labs, XR, EKG, and reassess.   Work-up reveals mild hypoNa, hypoCl consistent with dehydration, Cr elevated from normal c/w AKI. Receiving fluids. Troponin negative. No significant hypercarbia on VBG, suspect her mental status is more so related to her fever. Inflammatory markers ordered given COVID status. As she is on 2 L and has a concomitant AKI, will plan for admission. Discussed w/ hospitalist. Steroids, pharmacy consult for remdesivir.    Final Clinical Impression(s) / ED Diagnosis  Final diagnoses:  COVID-19  Generalized weakness  AKI (acute kidney injury) (HCC)       Note:  This document was prepared using Dragon voice recognition software and may include unintentional dictation errors.   Miguel AschoffMonks, Stokely Jeancharles L., MD 03/27/19 [id number]

## 2019-03-26 NOTE — ED Notes (Addendum)
Pt to triage via w/c with no distress noted, mask in place; pt brought in by EMS from home because she "wants to be checked for cdiff"; st that she was around a family member today that has it and wants to be tested; denies c/o

## 2019-03-27 ENCOUNTER — Other Ambulatory Visit: Payer: Self-pay

## 2019-03-27 ENCOUNTER — Inpatient Hospital Stay (HOSPITAL_COMMUNITY)
Admission: AD | Admit: 2019-03-27 | Discharge: 2019-03-31 | DRG: 177 | Disposition: A | Discharge status: Home / Self Care | Payer: Medicaid Other | Source: Other Acute Inpatient Hospital | Attending: Internal Medicine | Admitting: Internal Medicine

## 2019-03-27 ENCOUNTER — Telehealth: Payer: Self-pay | Admitting: Nurse Practitioner

## 2019-03-27 ENCOUNTER — Encounter (HOSPITAL_COMMUNITY): Payer: Self-pay

## 2019-03-27 DIAGNOSIS — G8929 Other chronic pain: Secondary | ICD-10-CM | POA: Diagnosis not present

## 2019-03-27 DIAGNOSIS — Z79899 Other long term (current) drug therapy: Secondary | ICD-10-CM

## 2019-03-27 DIAGNOSIS — Z7984 Long term (current) use of oral hypoglycemic drugs: Secondary | ICD-10-CM

## 2019-03-27 DIAGNOSIS — E1165 Type 2 diabetes mellitus with hyperglycemia: Secondary | ICD-10-CM | POA: Diagnosis present

## 2019-03-27 DIAGNOSIS — J439 Emphysema, unspecified: Secondary | ICD-10-CM | POA: Diagnosis not present

## 2019-03-27 DIAGNOSIS — M25561 Pain in right knee: Secondary | ICD-10-CM

## 2019-03-27 DIAGNOSIS — E860 Dehydration: Secondary | ICD-10-CM | POA: Diagnosis present

## 2019-03-27 DIAGNOSIS — M159 Polyosteoarthritis, unspecified: Secondary | ICD-10-CM | POA: Diagnosis present

## 2019-03-27 DIAGNOSIS — M792 Neuralgia and neuritis, unspecified: Secondary | ICD-10-CM | POA: Diagnosis not present

## 2019-03-27 DIAGNOSIS — Z9841 Cataract extraction status, right eye: Secondary | ICD-10-CM

## 2019-03-27 DIAGNOSIS — N179 Acute kidney failure, unspecified: Secondary | ICD-10-CM | POA: Diagnosis present

## 2019-03-27 DIAGNOSIS — J449 Chronic obstructive pulmonary disease, unspecified: Secondary | ICD-10-CM | POA: Diagnosis present

## 2019-03-27 DIAGNOSIS — Z9049 Acquired absence of other specified parts of digestive tract: Secondary | ICD-10-CM

## 2019-03-27 DIAGNOSIS — T380X5A Adverse effect of glucocorticoids and synthetic analogues, initial encounter: Secondary | ICD-10-CM | POA: Diagnosis present

## 2019-03-27 DIAGNOSIS — Z6841 Body Mass Index (BMI) 40.0 and over, adult: Secondary | ICD-10-CM

## 2019-03-27 DIAGNOSIS — J9601 Acute respiratory failure with hypoxia: Secondary | ICD-10-CM | POA: Diagnosis present

## 2019-03-27 DIAGNOSIS — Z885 Allergy status to narcotic agent status: Secondary | ICD-10-CM

## 2019-03-27 DIAGNOSIS — Z950 Presence of cardiac pacemaker: Secondary | ICD-10-CM | POA: Diagnosis not present

## 2019-03-27 DIAGNOSIS — M069 Rheumatoid arthritis, unspecified: Secondary | ICD-10-CM | POA: Diagnosis present

## 2019-03-27 DIAGNOSIS — Z9884 Bariatric surgery status: Secondary | ICD-10-CM

## 2019-03-27 DIAGNOSIS — Z79891 Long term (current) use of opiate analgesic: Secondary | ICD-10-CM | POA: Diagnosis not present

## 2019-03-27 DIAGNOSIS — Z961 Presence of intraocular lens: Secondary | ICD-10-CM | POA: Diagnosis present

## 2019-03-27 DIAGNOSIS — U071 COVID-19: Principal | ICD-10-CM | POA: Diagnosis present

## 2019-03-27 DIAGNOSIS — E871 Hypo-osmolality and hyponatremia: Secondary | ICD-10-CM | POA: Diagnosis present

## 2019-03-27 DIAGNOSIS — J440 Chronic obstructive pulmonary disease with acute lower respiratory infection: Secondary | ICD-10-CM | POA: Diagnosis present

## 2019-03-27 DIAGNOSIS — G4733 Obstructive sleep apnea (adult) (pediatric): Secondary | ICD-10-CM | POA: Diagnosis present

## 2019-03-27 DIAGNOSIS — G894 Chronic pain syndrome: Secondary | ICD-10-CM | POA: Diagnosis present

## 2019-03-27 DIAGNOSIS — E1169 Type 2 diabetes mellitus with other specified complication: Secondary | ICD-10-CM | POA: Diagnosis present

## 2019-03-27 DIAGNOSIS — M25562 Pain in left knee: Secondary | ICD-10-CM

## 2019-03-27 DIAGNOSIS — J1289 Other viral pneumonia: Secondary | ICD-10-CM | POA: Diagnosis present

## 2019-03-27 DIAGNOSIS — F419 Anxiety disorder, unspecified: Secondary | ICD-10-CM | POA: Diagnosis present

## 2019-03-27 DIAGNOSIS — Z87891 Personal history of nicotine dependence: Secondary | ICD-10-CM | POA: Diagnosis not present

## 2019-03-27 DIAGNOSIS — Z8619 Personal history of other infectious and parasitic diseases: Secondary | ICD-10-CM

## 2019-03-27 DIAGNOSIS — I491 Atrial premature depolarization: Secondary | ICD-10-CM | POA: Diagnosis present

## 2019-03-27 DIAGNOSIS — E669 Obesity, unspecified: Secondary | ICD-10-CM | POA: Diagnosis present

## 2019-03-27 DIAGNOSIS — J1282 Pneumonia due to coronavirus disease 2019: Secondary | ICD-10-CM

## 2019-03-27 DIAGNOSIS — I10 Essential (primary) hypertension: Secondary | ICD-10-CM | POA: Diagnosis present

## 2019-03-27 DIAGNOSIS — Z79890 Hormone replacement therapy (postmenopausal): Secondary | ICD-10-CM

## 2019-03-27 DIAGNOSIS — Z9089 Acquired absence of other organs: Secondary | ICD-10-CM

## 2019-03-27 DIAGNOSIS — I5032 Chronic diastolic (congestive) heart failure: Secondary | ICD-10-CM | POA: Diagnosis present

## 2019-03-27 DIAGNOSIS — E878 Other disorders of electrolyte and fluid balance, not elsewhere classified: Secondary | ICD-10-CM | POA: Diagnosis present

## 2019-03-27 DIAGNOSIS — Z813 Family history of other psychoactive substance abuse and dependence: Secondary | ICD-10-CM

## 2019-03-27 DIAGNOSIS — F319 Bipolar disorder, unspecified: Secondary | ICD-10-CM | POA: Diagnosis present

## 2019-03-27 DIAGNOSIS — Z794 Long term (current) use of insulin: Secondary | ICD-10-CM | POA: Diagnosis not present

## 2019-03-27 DIAGNOSIS — Z90710 Acquired absence of both cervix and uterus: Secondary | ICD-10-CM

## 2019-03-27 DIAGNOSIS — Z8249 Family history of ischemic heart disease and other diseases of the circulatory system: Secondary | ICD-10-CM

## 2019-03-27 DIAGNOSIS — I110 Hypertensive heart disease with heart failure: Secondary | ICD-10-CM | POA: Diagnosis present

## 2019-03-27 DIAGNOSIS — E039 Hypothyroidism, unspecified: Secondary | ICD-10-CM | POA: Diagnosis present

## 2019-03-27 DIAGNOSIS — Z9842 Cataract extraction status, left eye: Secondary | ICD-10-CM

## 2019-03-27 DIAGNOSIS — E1142 Type 2 diabetes mellitus with diabetic polyneuropathy: Secondary | ICD-10-CM | POA: Diagnosis present

## 2019-03-27 DIAGNOSIS — K219 Gastro-esophageal reflux disease without esophagitis: Secondary | ICD-10-CM | POA: Diagnosis present

## 2019-03-27 DIAGNOSIS — E1065 Type 1 diabetes mellitus with hyperglycemia: Secondary | ICD-10-CM

## 2019-03-27 DIAGNOSIS — E785 Hyperlipidemia, unspecified: Secondary | ICD-10-CM | POA: Diagnosis present

## 2019-03-27 DIAGNOSIS — Z8614 Personal history of Methicillin resistant Staphylococcus aureus infection: Secondary | ICD-10-CM

## 2019-03-27 DIAGNOSIS — N319 Neuromuscular dysfunction of bladder, unspecified: Secondary | ICD-10-CM | POA: Diagnosis present

## 2019-03-27 DIAGNOSIS — Z811 Family history of alcohol abuse and dependence: Secondary | ICD-10-CM

## 2019-03-27 DIAGNOSIS — Z818 Family history of other mental and behavioral disorders: Secondary | ICD-10-CM

## 2019-03-27 DIAGNOSIS — Z888 Allergy status to other drugs, medicaments and biological substances status: Secondary | ICD-10-CM

## 2019-03-27 DIAGNOSIS — F410 Panic disorder [episodic paroxysmal anxiety] without agoraphobia: Secondary | ICD-10-CM | POA: Diagnosis present

## 2019-03-27 DIAGNOSIS — Z882 Allergy status to sulfonamides status: Secondary | ICD-10-CM

## 2019-03-27 LAB — BASIC METABOLIC PANEL
Anion gap: 11 (ref 5–15)
Anion gap: 10 (ref 5–15)
BUN: 22 mg/dL — ABNORMAL HIGH (ref 6–20)
BUN: 30 mg/dL — ABNORMAL HIGH (ref 6–20)
CO2: 26 mmol/L (ref 22–32)
CO2: 22 mmol/L (ref 22–32)
Calcium: 9 mg/dL (ref 8.9–10.3)
Calcium: 7.7 mg/dL — ABNORMAL LOW (ref 8.9–10.3)
Chloride: 95 mmol/L — ABNORMAL LOW (ref 98–111)
Chloride: 95 mmol/L — ABNORMAL LOW (ref 98–111)
Creatinine, Ser: 1.66 mg/dL — ABNORMAL HIGH (ref 0.44–1.00)
Creatinine, Ser: 1.62 mg/dL — ABNORMAL HIGH (ref 0.44–1.00)
GFR calc Af Amer: 39 mL/min — ABNORMAL LOW (ref >60)
GFR calc Af Amer: 40 mL/min — ABNORMAL LOW (ref >60)
GFR calc non Af Amer: 34 mL/min — ABNORMAL LOW (ref >60)
GFR calc non Af Amer: 35 mL/min — ABNORMAL LOW (ref >60)
Glucose, Bld: 306 mg/dL — ABNORMAL HIGH (ref 70–99)
Glucose, Bld: 533 mg/dL (ref 70–99)
Potassium: 4.2 mmol/L (ref 3.5–5.1)
Potassium: 5.1 mmol/L (ref 3.5–5.1)
Sodium: 132 mmol/L — ABNORMAL LOW (ref 135–145)
Sodium: 127 mmol/L — ABNORMAL LOW (ref 135–145)

## 2019-03-27 LAB — BLOOD GAS, VENOUS
Acid-Base Excess: 2.8 mmol/L — ABNORMAL HIGH (ref 0.0–2.0)
Bicarbonate: 29.4 mmol/L — ABNORMAL HIGH (ref 20.0–28.0)
O2 Saturation: 24.5 %
Patient temperature: 37
pCO2, Ven: 52 mmHg (ref 44.0–60.0)
pH, Ven: 7.36 (ref 7.250–7.430)
pO2, Ven: <31 mmHg — CL (ref 32.0–45.0)

## 2019-03-27 LAB — URINALYSIS, COMPLETE (UACMP) WITH MICROSCOPIC
Bacteria, UA: NONE SEEN
Bilirubin Urine: NEGATIVE
Glucose, UA: >=500 mg/dL — AB
Hgb urine dipstick: NEGATIVE
Ketones, ur: NEGATIVE mg/dL
Leukocytes,Ua: NEGATIVE
Nitrite: NEGATIVE
Protein, ur: 30 mg/dL — AB
Specific Gravity, Urine: 1.016 (ref 1.005–1.030)
Squamous Epithelial / HPF: NONE SEEN (ref 0–5)
pH: 5 (ref 5.0–8.0)

## 2019-03-27 LAB — URINE DRUG SCREEN, QUALITATIVE (ARMC ONLY)
Amphetamines, Ur Screen: NOT DETECTED
Barbiturates, Ur Screen: NOT DETECTED
Benzodiazepine, Ur Scrn: NOT DETECTED
Cannabinoid 50 Ng, Ur ~~LOC~~: NOT DETECTED
Cocaine Metabolite,Ur ~~LOC~~: NOT DETECTED
MDMA (Ecstasy)Ur Screen: NOT DETECTED
Methadone Scn, Ur: NOT DETECTED
Opiate, Ur Screen: POSITIVE — AB
Phencyclidine (PCP) Ur S: NOT DETECTED
Tricyclic, Ur Screen: POSITIVE — AB

## 2019-03-27 LAB — INFLUENZA PANEL BY PCR (TYPE A & B)
Influenza A By PCR: NEGATIVE
Influenza B By PCR: NEGATIVE

## 2019-03-27 LAB — GLUCOSE, CAPILLARY
Glucose-Capillary: 513 mg/dL (ref 70–99)
Glucose-Capillary: 521 mg/dL (ref 70–99)
Glucose-Capillary: 510 mg/dL (ref 70–99)
Glucose-Capillary: 483 mg/dL — ABNORMAL HIGH (ref 70–99)
Glucose-Capillary: 471 mg/dL — ABNORMAL HIGH (ref 70–99)
Glucose-Capillary: 491 mg/dL — ABNORMAL HIGH (ref 70–99)
Glucose-Capillary: 423 mg/dL — ABNORMAL HIGH (ref 70–99)
Glucose-Capillary: 332 mg/dL — ABNORMAL HIGH (ref 70–99)

## 2019-03-27 LAB — TSH: TSH: 1.424 u[IU]/mL (ref 0.350–4.500)

## 2019-03-27 LAB — CBC
HCT: 36.7 % (ref 36.0–46.0)
Hemoglobin: 11.9 g/dL — ABNORMAL LOW (ref 12.0–15.0)
MCH: 26.6 pg (ref 26.0–34.0)
MCHC: 32.4 g/dL (ref 30.0–36.0)
MCV: 81.9 fL (ref 80.0–100.0)
Platelets: UNDETERMINED 10*3/uL (ref 150–400)
RBC: 4.48 MIL/uL (ref 3.87–5.11)
RDW: 14.6 % (ref 11.5–15.5)
WBC: 4.9 10*3/uL (ref 4.0–10.5)
nRBC: 0 % (ref 0.0–0.2)

## 2019-03-27 LAB — ABO/RH
ABO/RH(D): A POS
ABO/RH(D): A POS

## 2019-03-27 LAB — FERRITIN: Ferritin: 219 ng/mL (ref 11–307)

## 2019-03-27 LAB — HIV ANTIBODY (ROUTINE TESTING W REFLEX): HIV Screen 4th Generation wRfx: NONREACTIVE

## 2019-03-27 LAB — HEMOGLOBIN A1C
Hgb A1c MFr Bld: 10.7 % — ABNORMAL HIGH (ref 4.8–5.6)
Mean Plasma Glucose: 260.39 mg/dL

## 2019-03-27 LAB — HEPATIC FUNCTION PANEL
ALT: 47 U/L — ABNORMAL HIGH (ref 0–44)
AST: 58 U/L — ABNORMAL HIGH (ref 15–41)
Albumin: 3.8 g/dL (ref 3.5–5.0)
Alkaline Phosphatase: 82 U/L (ref 38–126)
Bilirubin, Direct: 0.1 mg/dL (ref 0.0–0.2)
Indirect Bilirubin: 0.4 mg/dL (ref 0.3–0.9)
Total Bilirubin: 0.5 mg/dL (ref 0.3–1.2)
Total Protein: 8.2 g/dL — ABNORMAL HIGH (ref 6.5–8.1)

## 2019-03-27 LAB — FIBRIN DERIVATIVES D-DIMER (ARMC ONLY): Fibrin derivatives D-dimer (ARMC): 763.81 ng/mL (FEU) — ABNORMAL HIGH (ref 0.00–499.00)

## 2019-03-27 LAB — POCT PREGNANCY, URINE: Preg Test, Ur: NEGATIVE

## 2019-03-27 LAB — TROPONIN I (HIGH SENSITIVITY)
Troponin I (High Sensitivity): 5 ng/L (ref <18)
Troponin I (High Sensitivity): 5 ng/L (ref <18)

## 2019-03-27 LAB — LACTATE DEHYDROGENASE: LDH: 172 U/L (ref 98–192)

## 2019-03-27 LAB — C-REACTIVE PROTEIN: CRP: 4.4 mg/dL — ABNORMAL HIGH (ref <1.0)

## 2019-03-27 MED ORDER — MAGNESIUM HYDROXIDE 400 MG/5ML PO SUSP: 30.0000 mL | Freq: Every day | ORAL | Status: DC | PRN

## 2019-03-27 MED ORDER — ACETAMINOPHEN 500 MG PO TABS
ORAL_TABLET | ORAL | Status: AC
  Filled 2019-03-27: qty 2

## 2019-03-27 MED ORDER — PROMETHAZINE HCL 25 MG PO TABS: 25.0000 mg | ORAL_TABLET | ORAL | Status: DC | PRN

## 2019-03-27 MED ORDER — LEFLUNOMIDE 20 MG PO TABS
10.0000 mg | ORAL_TABLET | Freq: Every day | ORAL | Status: DC
  Administered 2019-03-27: 10 mg via ORAL
  Filled 2019-03-27 (×3): qty 0.5

## 2019-03-27 MED ORDER — ARIPIPRAZOLE 5 MG PO TABS
5.0000 mg | ORAL_TABLET | Freq: Every day | ORAL | Status: DC
  Administered 2019-03-27: 5 mg via ORAL
  Filled 2019-03-27: qty 1

## 2019-03-27 MED ORDER — ASPIRIN EC 81 MG PO TBEC
81.0000 mg | DELAYED_RELEASE_TABLET | Freq: Every day | ORAL | Status: DC
  Administered 2019-03-27: 81 mg via ORAL
  Filled 2019-03-27: qty 1

## 2019-03-27 MED ORDER — PREGABALIN 75 MG PO CAPS
150.0000 mg | ORAL_CAPSULE | Freq: Three times a day (TID) | ORAL | Status: DC
  Administered 2019-03-27: 150 mg via ORAL
  Filled 2019-03-27: qty 2

## 2019-03-27 MED ORDER — ONDANSETRON HCL 4 MG PO TABS: 4.0000 mg | ORAL_TABLET | Freq: Four times a day (QID) | ORAL | Status: DC | PRN

## 2019-03-27 MED ORDER — DARIFENACIN HYDROBROMIDE ER 7.5 MG PO TB24
7.5000 mg | ORAL_TABLET | Freq: Every day | ORAL | Status: DC
  Administered 2019-03-27 – 2019-03-31 (×5): 7.5 mg via ORAL
  Filled 2019-03-27 (×5): qty 1

## 2019-03-27 MED ORDER — FLUOXETINE HCL 20 MG PO CAPS
20.0000 mg | ORAL_CAPSULE | Freq: Every day | ORAL | Status: DC
  Administered 2019-03-28 – 2019-03-31 (×4): 20 mg via ORAL
  Filled 2019-03-27 (×4): qty 1

## 2019-03-27 MED ORDER — PRAMIPEXOLE DIHYDROCHLORIDE 0.25 MG PO TABS
0.1250 mg | ORAL_TABLET | Freq: Two times a day (BID) | ORAL | Status: DC
  Administered 2019-03-27 – 2019-03-31 (×8): 0.125 mg via ORAL
  Filled 2019-03-27: qty 1
  Filled 2019-03-27 (×4): qty 0.5
  Filled 2019-03-27: qty 1
  Filled 2019-03-27 (×4): qty 0.5

## 2019-03-27 MED ORDER — SODIUM CHLORIDE 0.9 % IV SOLN: 200.0000 mg | Freq: Once | INTRAVENOUS | Status: DC

## 2019-03-27 MED ORDER — SODIUM CHLORIDE 0.9 % IV BOLUS
500.0000 mL | Freq: Once | INTRAVENOUS | Status: AC
  Administered 2019-03-27: 500 mL via INTRAVENOUS

## 2019-03-27 MED ORDER — POLYETHYLENE GLYCOL 3350 17 G PO PACK
17.0000 g | PACK | Freq: Two times a day (BID) | ORAL | Status: DC
  Administered 2019-03-27 – 2019-03-29 (×3): 17 g via ORAL
  Filled 2019-03-27 (×6): qty 1

## 2019-03-27 MED ORDER — VITAMIN C 500 MG PO TABS
500.0000 mg | ORAL_TABLET | Freq: Every day | ORAL | Status: DC
  Administered 2019-03-27: 500 mg via ORAL
  Filled 2019-03-27: qty 1

## 2019-03-27 MED ORDER — INSULIN ASPART 100 UNIT/ML ~~LOC~~ SOLN: 0.0000 [IU] | Freq: Three times a day (TID) | SUBCUTANEOUS | Status: DC

## 2019-03-27 MED ORDER — ENOXAPARIN SODIUM 40 MG/0.4ML ~~LOC~~ SOLN
40.0000 mg | Freq: Two times a day (BID) | SUBCUTANEOUS | Status: DC
  Filled 2019-03-27: qty 0.4

## 2019-03-27 MED ORDER — ATORVASTATIN CALCIUM 20 MG PO TABS: 80.0000 mg | ORAL_TABLET | Freq: Every day | ORAL | Status: DC

## 2019-03-27 MED ORDER — ZOLPIDEM TARTRATE 5 MG PO TABS: 5.0000 mg | ORAL_TABLET | Freq: Every evening | ORAL | Status: DC | PRN

## 2019-03-27 MED ORDER — GUAIFENESIN-DM 100-10 MG/5ML PO SYRP: 10.0000 mL | ORAL_SOLUTION | ORAL | Status: DC | PRN

## 2019-03-27 MED ORDER — INSULIN ASPART 100 UNIT/ML ~~LOC~~ SOLN
10.0000 [IU] | Freq: Once | SUBCUTANEOUS | Status: AC
  Administered 2019-03-27: 10 [IU] via SUBCUTANEOUS

## 2019-03-27 MED ORDER — INSULIN ASPART 100 UNIT/ML ~~LOC~~ SOLN
0.0000 [IU] | Freq: Three times a day (TID) | SUBCUTANEOUS | Status: DC
  Filled 2019-03-27: qty 1

## 2019-03-27 MED ORDER — GUAIFENESIN ER 600 MG PO TB12
600.0000 mg | ORAL_TABLET | Freq: Two times a day (BID) | ORAL | Status: DC
  Administered 2019-03-27: 600 mg via ORAL
  Filled 2019-03-27: qty 1

## 2019-03-27 MED ORDER — ZINC SULFATE 220 (50 ZN) MG PO CAPS
220.0000 mg | ORAL_CAPSULE | Freq: Every day | ORAL | Status: DC
  Administered 2019-03-27: 220 mg via ORAL
  Filled 2019-03-27: qty 1

## 2019-03-27 MED ORDER — ACETAMINOPHEN 500 MG PO TABS
1000.0000 mg | ORAL_TABLET | Freq: Once | ORAL | Status: AC
  Administered 2019-03-27: 1000 mg via ORAL

## 2019-03-27 MED ORDER — SODIUM CHLORIDE 0.9 % IV SOLN
200.0000 mg | Freq: Once | INTRAVENOUS | Status: DC
  Filled 2019-03-27: qty 40

## 2019-03-27 MED ORDER — QUETIAPINE FUMARATE 50 MG PO TABS
300.0000 mg | ORAL_TABLET | Freq: Every day | ORAL | Status: DC
  Administered 2019-03-27 – 2019-03-30 (×4): 300 mg via ORAL
  Filled 2019-03-27 (×2): qty 1
  Filled 2019-03-27: qty 6
  Filled 2019-03-27: qty 1
  Filled 2019-03-27: qty 6

## 2019-03-27 MED ORDER — SODIUM CHLORIDE 0.9 % IV SOLN: 100.0000 mg | Freq: Every day | INTRAVENOUS | Status: DC

## 2019-03-27 MED ORDER — SODIUM CHLORIDE 0.9 % IV SOLN
2.0000 g | INTRAVENOUS | Status: DC
  Administered 2019-03-27: 2 g via INTRAVENOUS
  Filled 2019-03-27: qty 20

## 2019-03-27 MED ORDER — FOLIC ACID 1 MG PO TABS
1.0000 mg | ORAL_TABLET | Freq: Every day | ORAL | Status: DC
  Administered 2019-03-28 – 2019-03-31 (×4): 1 mg via ORAL
  Filled 2019-03-27 (×4): qty 1

## 2019-03-27 MED ORDER — ACETAMINOPHEN 325 MG PO TABS: 650.0000 mg | ORAL_TABLET | Freq: Four times a day (QID) | ORAL | Status: DC | PRN

## 2019-03-27 MED ORDER — HYDROCOD POLST-CPM POLST ER 10-8 MG/5ML PO SUER
5.0000 mL | Freq: Two times a day (BID) | ORAL | Status: DC
  Administered 2019-03-27 – 2019-03-31 (×9): 5 mL via ORAL
  Filled 2019-03-27 (×9): qty 5

## 2019-03-27 MED ORDER — VITAMIN C 500 MG PO TABS: 500.0000 mg | ORAL_TABLET | Freq: Every day | ORAL | Status: DC

## 2019-03-27 MED ORDER — PREGABALIN 50 MG PO CAPS
150.0000 mg | ORAL_CAPSULE | Freq: Three times a day (TID) | ORAL | Status: DC
  Administered 2019-03-27 – 2019-03-31 (×12): 150 mg via ORAL
  Filled 2019-03-27 (×13): qty 3

## 2019-03-27 MED ORDER — SODIUM CHLORIDE 0.9 % IV SOLN
INTRAVENOUS | Status: DC
  Administered 2019-03-27: 100 mL/h via INTRAVENOUS

## 2019-03-27 MED ORDER — OXYCODONE HCL 5 MG PO TABS: 5.0000 mg | ORAL_TABLET | Freq: Four times a day (QID) | ORAL | Status: DC | PRN

## 2019-03-27 MED ORDER — POTASSIUM CHLORIDE CRYS ER 20 MEQ PO TBCR
20.0000 meq | EXTENDED_RELEASE_TABLET | Freq: Two times a day (BID) | ORAL | Status: DC
  Administered 2019-03-27: 20 meq via ORAL
  Filled 2019-03-27: qty 1

## 2019-03-27 MED ORDER — BUSPIRONE HCL 10 MG PO TABS
10.0000 mg | ORAL_TABLET | Freq: Two times a day (BID) | ORAL | Status: DC
  Administered 2019-03-27 – 2019-03-31 (×8): 10 mg via ORAL
  Filled 2019-03-27 (×10): qty 1

## 2019-03-27 MED ORDER — ZINC SULFATE 220 (50 ZN) MG PO CAPS
220.0000 mg | ORAL_CAPSULE | Freq: Every day | ORAL | Status: DC
  Administered 2019-03-28 – 2019-03-31 (×4): 220 mg via ORAL
  Filled 2019-03-27 (×5): qty 1

## 2019-03-27 MED ORDER — SODIUM CHLORIDE 0.9 % IV SOLN
200.0000 mg | Freq: Once | INTRAVENOUS | Status: AC
  Administered 2019-03-27: 200 mg via INTRAVENOUS
  Filled 2019-03-27: qty 200

## 2019-03-27 MED ORDER — QUETIAPINE FUMARATE 300 MG PO TABS: 300.0000 mg | ORAL_TABLET | Freq: Every day | ORAL | Status: DC

## 2019-03-27 MED ORDER — VITAMIN C 500 MG PO TABS
500.0000 mg | ORAL_TABLET | Freq: Every day | ORAL | Status: DC
  Administered 2019-03-28 – 2019-03-31 (×4): 500 mg via ORAL
  Filled 2019-03-27 (×5): qty 1

## 2019-03-27 MED ORDER — INSULIN ASPART 100 UNIT/ML ~~LOC~~ SOLN
15.0000 [IU] | Freq: Once | SUBCUTANEOUS | Status: AC
  Administered 2019-03-27: 15 [IU] via SUBCUTANEOUS

## 2019-03-27 MED ORDER — BUSPIRONE HCL 5 MG PO TABS
10.0000 mg | ORAL_TABLET | Freq: Two times a day (BID) | ORAL | Status: DC
  Administered 2019-03-27: 10 mg via ORAL
  Filled 2019-03-27: qty 2

## 2019-03-27 MED ORDER — SODIUM CHLORIDE 0.9 % IV SOLN
500.0000 mg | INTRAVENOUS | Status: DC
  Administered 2019-03-27: 500 mg via INTRAVENOUS
  Filled 2019-03-27: qty 500

## 2019-03-27 MED ORDER — LEVOTHYROXINE SODIUM 88 MCG PO TABS
88.0000 ug | ORAL_TABLET | Freq: Every day | ORAL | Status: DC
  Administered 2019-03-28 – 2019-03-31 (×4): 88 ug via ORAL
  Filled 2019-03-27 (×6): qty 1

## 2019-03-27 MED ORDER — FOLIC ACID 1 MG PO TABS
1.0000 mg | ORAL_TABLET | Freq: Every day | ORAL | Status: DC
  Administered 2019-03-27: 1 mg via ORAL
  Filled 2019-03-27: qty 1

## 2019-03-27 MED ORDER — IBUPROFEN 400 MG PO TABS
400.0000 mg | ORAL_TABLET | Freq: Once | ORAL | Status: AC
  Administered 2019-03-27: 400 mg via ORAL
  Filled 2019-03-27: qty 1

## 2019-03-27 MED ORDER — DEXAMETHASONE SODIUM PHOSPHATE 10 MG/ML IJ SOLN
6.0000 mg | Freq: Once | INTRAMUSCULAR | Status: AC
  Administered 2019-03-27: 6 mg via INTRAVENOUS
  Filled 2019-03-27: qty 1

## 2019-03-27 MED ORDER — INSULIN ASPART 100 UNIT/ML ~~LOC~~ SOLN
0.0000 [IU] | Freq: Three times a day (TID) | SUBCUTANEOUS | Status: DC
  Administered 2019-03-27: 9 [IU] via SUBCUTANEOUS

## 2019-03-27 MED ORDER — INSULIN ASPART 100 UNIT/ML ~~LOC~~ SOLN: 0.0000 [IU] | Freq: Every day | SUBCUTANEOUS | Status: DC

## 2019-03-27 MED ORDER — INSULIN ASPART 100 UNIT/ML ~~LOC~~ SOLN
20.0000 [IU] | Freq: Once | SUBCUTANEOUS | Status: AC
  Administered 2019-03-27: 20 [IU] via SUBCUTANEOUS

## 2019-03-27 MED ORDER — DEXAMETHASONE SODIUM PHOSPHATE 10 MG/ML IJ SOLN
6.0000 mg | INTRAMUSCULAR | Status: DC
  Administered 2019-03-28 – 2019-03-31 (×4): 6 mg via INTRAVENOUS
  Filled 2019-03-27 (×4): qty 1

## 2019-03-27 MED ORDER — INSULIN DETEMIR 100 UNIT/ML ~~LOC~~ SOLN
10.0000 [IU] | Freq: Every day | SUBCUTANEOUS | Status: DC
  Administered 2019-03-27: 10 [IU] via SUBCUTANEOUS
  Filled 2019-03-27 (×2): qty 0.1

## 2019-03-27 MED ORDER — INSULIN ASPART 100 UNIT/ML ~~LOC~~ SOLN
0.0000 [IU] | Freq: Every day | SUBCUTANEOUS | Status: DC
  Administered 2019-03-27: 4 [IU] via SUBCUTANEOUS

## 2019-03-27 MED ORDER — FLUOXETINE HCL 20 MG PO CAPS
20.0000 mg | ORAL_CAPSULE | Freq: Every day | ORAL | Status: DC
  Administered 2019-03-27: 20 mg via ORAL
  Filled 2019-03-27: qty 1

## 2019-03-27 MED ORDER — ACETAMINOPHEN 325 MG PO TABS
650.0000 mg | ORAL_TABLET | Freq: Four times a day (QID) | ORAL | Status: DC | PRN
  Administered 2019-03-27: 650 mg via ORAL
  Filled 2019-03-27: qty 2

## 2019-03-27 MED ORDER — DEXAMETHASONE 4 MG PO TABS
6.0000 mg | ORAL_TABLET | ORAL | Status: DC
  Administered 2019-03-27: 6 mg via ORAL
  Filled 2019-03-27: qty 1.5

## 2019-03-27 MED ORDER — ENOXAPARIN SODIUM 40 MG/0.4ML ~~LOC~~ SOLN: 40.0000 mg | SUBCUTANEOUS | Status: DC

## 2019-03-27 MED ORDER — LISINOPRIL 2.5 MG PO TABS
2.5000 mg | ORAL_TABLET | Freq: Every day | ORAL | Status: DC
  Administered 2019-03-28 – 2019-03-31 (×4): 2.5 mg via ORAL
  Filled 2019-03-27 (×5): qty 1

## 2019-03-27 MED ORDER — FAMOTIDINE 20 MG PO TABS: 20.0000 mg | ORAL_TABLET | Freq: Two times a day (BID) | ORAL | Status: DC

## 2019-03-27 MED ORDER — ONDANSETRON HCL 4 MG/2ML IJ SOLN: 4.0000 mg | Freq: Four times a day (QID) | INTRAMUSCULAR | Status: DC | PRN

## 2019-03-27 MED ORDER — BISACODYL 10 MG RE SUPP
10.0000 mg | Freq: Once | RECTAL | Status: AC
  Administered 2019-03-27: 10 mg via RECTAL
  Filled 2019-03-27 (×2): qty 1

## 2019-03-27 MED ORDER — MORPHINE SULFATE (PF) 2 MG/ML IV SOLN
2.0000 mg | INTRAVENOUS | Status: DC | PRN
  Administered 2019-03-27: 2 mg via INTRAVENOUS
  Filled 2019-03-27: qty 1

## 2019-03-27 MED ORDER — LEVOTHYROXINE SODIUM 88 MCG PO TABS
88.0000 ug | ORAL_TABLET | Freq: Every day | ORAL | Status: DC
  Administered 2019-03-27: 88 ug via ORAL
  Filled 2019-03-27: qty 1

## 2019-03-27 MED ORDER — INSULIN ASPART 100 UNIT/ML ~~LOC~~ SOLN
0.0000 [IU] | Freq: Three times a day (TID) | SUBCUTANEOUS | Status: DC
  Administered 2019-03-27: 15 [IU] via SUBCUTANEOUS
  Administered 2019-03-28: 11 [IU] via SUBCUTANEOUS

## 2019-03-27 MED ORDER — ARIPIPRAZOLE 5 MG PO TABS
5.0000 mg | ORAL_TABLET | Freq: Every day | ORAL | Status: DC
  Administered 2019-03-28 – 2019-03-31 (×4): 5 mg via ORAL
  Filled 2019-03-27 (×4): qty 1

## 2019-03-27 MED ORDER — GUAIFENESIN-DM 100-10 MG/5ML PO SYRP
10.0000 mL | ORAL_SOLUTION | ORAL | Status: DC | PRN
  Filled 2019-03-27: qty 10

## 2019-03-27 MED ORDER — HYDROCOD POLST-CPM POLST ER 10-8 MG/5ML PO SUER: 5.0000 mL | Freq: Two times a day (BID) | ORAL | Status: DC | PRN

## 2019-03-27 MED ORDER — HYDROXYCHLOROQUINE SULFATE 200 MG PO TABS
200.0000 mg | ORAL_TABLET | Freq: Two times a day (BID) | ORAL | Status: DC
  Filled 2019-03-27: qty 1

## 2019-03-27 MED ORDER — VITAMIN D3 25 MCG (1000 UNIT) PO TABS
5000.0000 [IU] | ORAL_TABLET | Freq: Every day | ORAL | Status: DC
  Administered 2019-03-27: 5000 [IU] via ORAL
  Filled 2019-03-27 (×2): qty 5

## 2019-03-27 MED ORDER — OXYCODONE HCL 5 MG PO TABS
5.0000 mg | ORAL_TABLET | Freq: Four times a day (QID) | ORAL | Status: DC | PRN
  Administered 2019-03-27: 5 mg via ORAL
  Filled 2019-03-27: qty 1

## 2019-03-27 MED ORDER — IPRATROPIUM-ALBUTEROL 20-100 MCG/ACT IN AERS
1.0000 | INHALATION_SPRAY | Freq: Four times a day (QID) | RESPIRATORY_TRACT | Status: DC
  Administered 2019-03-27 – 2019-03-31 (×14): 1 via RESPIRATORY_TRACT
  Filled 2019-03-27 (×2): qty 4

## 2019-03-27 MED ORDER — PHENOL 1.4 % MT LIQD
1.0000 | OROMUCOSAL | Status: DC | PRN
  Filled 2019-03-27: qty 177

## 2019-03-27 MED ORDER — ALBUTEROL SULFATE HFA 108 (90 BASE) MCG/ACT IN AERS
1.0000 | INHALATION_SPRAY | RESPIRATORY_TRACT | Status: DC | PRN
  Filled 2019-03-27: qty 6.7

## 2019-03-27 MED ORDER — VITAMIN D (ERGOCALCIFEROL) 1.25 MG (50000 UNIT) PO CAPS
50000.0000 [IU] | ORAL_CAPSULE | ORAL | Status: DC
  Administered 2019-03-27: 50000 [IU] via ORAL
  Filled 2019-03-27 (×2): qty 1

## 2019-03-27 MED ORDER — ACETAMINOPHEN 650 MG RE SUPP: 650.0000 mg | Freq: Four times a day (QID) | RECTAL | Status: DC | PRN

## 2019-03-27 MED ORDER — INSULIN GLARGINE 100 UNIT/ML ~~LOC~~ SOLN
65.0000 [IU] | Freq: Every day | SUBCUTANEOUS | Status: DC
  Administered 2019-03-27: 65 [IU] via SUBCUTANEOUS
  Filled 2019-03-27: qty 0.65

## 2019-03-27 MED ORDER — ATORVASTATIN CALCIUM 40 MG PO TABS
80.0000 mg | ORAL_TABLET | Freq: Every day | ORAL | Status: DC
  Administered 2019-03-27 – 2019-03-30 (×4): 80 mg via ORAL
  Filled 2019-03-27 (×4): qty 2

## 2019-03-27 NOTE — Unmapped (Signed)
Do you have any of the following conditions?   ? Heart or Lung condition: No  ? Hypertension: Yes  ? Diabetes: Yes  ? Congestive Heart Disease: Yes  ? COPD: No  ? Asthma: No  ? HIV: No  ? Chronic kidney disease (renal failure): No  ? Liver disease: No yes  ? Cystic Fibrosis: No  ? Sickle Cell Anemia or other blood disorder: No  ? Neurologic disease that limits movement: No  ? Moderate to severe developmental delay: No  ? Usage of Immunosuppressant medications (used in transplant or Lupus patients): No  ? Usage of Chemotherapy medications: No  ? Long-term steroid usage:Yes  ? Pregnant or 2 weeks post partum No  BMI > 30: Yes (         03/21/19 51.97 kg/m??   ? )  ? [employer] Student: No  ? COVID sxs (Fever, Cough, SOB, Chills, Muscle pain, Loss of taste or smell, Vomiting or diarrhea, Sore throat): Yes        COVID Testing  ? Have you been COVID tested: Yes  o If Yes: was it an antibody test: No  ? What was the date of your COVID test: 12/4  ? What was the result of your COVID test: positive      Recent:   What is the date of your last related visit?  COVID positive 12/4  Related acute medications Rx'd:  Motrin (1200 mg) @ 1300 Allergy meds @ 1300 12/7  Home treatment tried: none    Relevant:   Allergies: Cephalexin, Doxycycline, Sulfa (sulfonamide antibiotics), Codeine, Darvocet a500  [propoxyphene n-acetaminophen], Hydrocodone, Meropenem, and Sulfasalazine  Medications: long list of medications  Health History: liver disease, HTN, DM 2, CHF  Weight: N/A    COVID positive 12/4. Cough worse tonight. SOB when walking. Sore throat 7/10. Temp 99.2 (but took 1200 mg motrin). Discussed appropriate Motrin dosing. Blood sugar 341 2050 12/7-took 10 U Humolog. Says that is her normal BS at this time of night. Spoke to daughter for triage bc patient gets SOB/coughing when talking. Will page MD on call bc call PCP now dispo.   Underlying health conditions: liver disease, CHF, DM 2, HTN    Reason for Disposition ??? MILD difficulty breathing (e.g., minimal/no SOB at rest, SOB with walking, pulse <100)    Answer Assessment - Initial Assessment Questions  1. COVID-19 DIAGNOSIS: Who made your Coronavirus (COVID-19) diagnosis? Was it confirmed by a positive lab test? If not diagnosed by a HCP, ask Are there lots of cases (community spread) where you live? (See public health department website, if unsure)    * MAJOR community spread: high number of cases; numbers of cases are increasing; many people hospitalized.    * MINOR community spread: low number of cases; not increasing; few or no people hospitalized      COVID positive 12/4 at [facility] drive through  2. ONSET: When did the COVID-19 symptoms start?       Exposure to COVID positive 11/29. Sore throat started 11/30  3. WORST SYMPTOM: What is your worst symptom? (e.g., cough, fever, shortness of breath, muscle aches)      cough  4. COUGH: Do you have a cough? If so, ask: How bad is the cough?        Coughing constantly, coughing spells diff to stop coughing,   5. FEVER: Do you have a fever? If so, ask: What is your temperature, how was it measured, and when  did it start?      99.2 ear @ 1700 12/7. Tmax 99.6 12/6 Noc  6. RESPIRATORY STATUS: Describe your breathing? (e.g., shortness of breath, wheezing, unable to speak)       No diff breathing, SOB when walking, no wheezing, able to speak in complete sentences for about 5 mini and then starts coughing too much.   7. BETTER-SAME-WORSE: Are you getting better, staying the same or getting worse compared to yesterday?  If getting worse, ask, In what way?      same  8. HIGH RISK DISEASE: Do you have any chronic medical problems? (e.g., asthma, heart or lung disease, weak immune system, etc.)      CHF, DM 2, HTN  9. PREGNANCY: Is there any chance you are pregnant? When was your last menstrual period?      No LMP more than 10 years ago 10. OTHER SYMPTOMS: Do you have any other symptoms?  (e.g., runny nose, headache, sore throat, loss of smell)       Loss of taste and smell, congested-no runny nose, sore throat 7/10, no GI symptoms    Protocols used: CORONAVIRUS (COVID-19) - DIAGNOSED OR SUSPECTED-A-AH

## 2019-03-27 NOTE — Unmapped (Signed)
Copied from CRM [id number]. Topic: HL - Nurse Triage - [facility] Contract Page  >> Mar 26, 2019  9:33 PM Myrtis Hopping, RN wrote:  Please use the following dot phrase: .[email or web address]

## 2019-03-27 NOTE — ED Notes (Signed)
Leaving with [facility] transport. Pt stable on leaving.  Daughter notified previously of transfer to [facility]; phone consent obtained for transfer from daughter since pt disoriented.

## 2019-03-27 NOTE — H&P (Addendum)
[place] at [facility]   PATIENT NAME: [name] FisherDorothy Renner    MR#:  [id number]  DATE OF BIRTH:  09/15/1960  DATE OF ADMISSION:  03/26/2019  PRIMARY CARE PHYSICIAN: [facility]   REQUESTING/REFERRING PHYSICIAN: Paschal DoppMonks, Sarah, MD CHIEF COMPLAINT:   Chief Complaint  Patient presents with   Cough    HISTORY OF PRESENT ILLNESS:  [name] [name]  is a 58 y.o. obese Caucasian female with a known history of chronic diastolic CHF, type 2 diabetes mellitus, GERD, rheumatoid arthritis, hypothyroidism and sleep apnea, who presented to the emergency room with acute onset of worsening dyspnea with associated dry cough and generalized weakness over the last couple of days.  She had a positive Covid 19 PCR on 12/4.  She admitted to intermittent fever and chills over the last couple of days.  She has been occasionally expectorating yellowish sputum.  She admitted to nausea and vomiting significantly diminished appetite.  She has been having occasional chest pain only with cough.  No leg pain or edema recent travels or surgeries.  No dysuria, oliguria or hematuria or flank pain.  Upon presentation to the emergency room 21/9 9 with a temperature of 99.9 and pulse ox symmetry was 93% on room air and it has dropped as low as 60% on room air.  On 2 L of O2 by nasal cannula it has been up to 95%.  Labs revealed venous blood gas with a pH of 7.36 and PCO2 52 with bicarbonate of 29.4.  CMP was remarkable for hyperglycemia of 306 and hypochloremia of 95 and hyponatremia 132 with elevated creatinine of 1.66 and BUN 22.  AST was slightly elevated at 58 and ALT 47.  Urinalysis showed more than 500 glucose.  Urine drug screen was positive for opiates and tricyclic.  Portable chest x-ray revealed minimal streaky bibasilar opacities that favor atelectasis however may represent atypical viral pneumonia in the setting of COVID-19.  EKG is normal sinus rhythm with a rate of 95 and low voltage QRS and occasional  PACs.  The patient was given 1 g of p.o. Tylenol, 6 mg IV Decadron, 600 mg of p.o. Mucinex, 2 mg of IV morphine sulfate and 400 mg p.o. Advil and 200 mg IV remdesivir as well as 500 mL IV normal saline bolus.  She will be admitted to medical monitored isolation bed for further evaluation and management. PAST MEDICAL HISTORY:   Past Medical History:  Diagnosis Date   Abdominal wall hernia 01/29/2013   Anxiety    Arthritis    Rheumatoid   C. difficile colitis    Chronic diastolic heart failure (HCC)    Depression    Diabetes mellitus    states no meds or diet restrictions  at present   Diastolic CHF (HCC)    Esophagitis    Fluid retention    GERD (gastroesophageal reflux disease)    Hiatal hernia    Hypertension    Hypokalemia due to loss of potassium 10/21/2015   Overview:  Associated with 3 weeks of diarrhea  And QT prolongation.   Hypothyroidism    IBS (irritable bowel syndrome)    Moderate episode of recurrent major depressive disorder (HCC) 06/03/2004   Morbid obesity (HCC)    MRSA (methicillin resistant Staphylococcus aureus) infection 11/2017   left inner thigh abcess   Neurogenic bladder    has pacemaker   Neuropathy    Obesity    Panic attacks    Rheumatoid arthritis (HCC)    Sleep  apnea    STATES SEVERE, CANT TOLERATE MASK- LAST STUDY YEARS AGO    PAST SURGICAL HISTORY:   Past Surgical History:  Procedure Laterality Date   ABDOMINAL HYSTERECTOMY     CHOLECYSTECTOMY     DG GREAT TOE RIGHT FOOT  02/23/2018   EYE SURGERY     bilateral cataract extraction with IOL   HERNIA REPAIR     ventral hernia with strangulation   LAPAROSCOPIC GASTRIC BANDING  03/20/07   TONSILLECTOMY     TUBAL LIGATION      SOCIAL HISTORY:   Social History   Tobacco Use   Smoking status: Former Smoker    Packs/day: 2.00    Years: 27.00    Pack years: 54.00    Types: Cigarettes    Quit date: 07/30/1999    Years since quitting: 19.6   Smokeless  tobacco: Never Used  Substance Use Topics   Alcohol use: No    FAMILY HISTORY:   Family History  Problem Relation Age of Onset   Heart failure Father    Bipolar disorder Father    Alcohol abuse Father    Anxiety disorder Father    Depression Father    Heart disease Brother    Heart attack Brother 66       MI s/p stents placed   Anxiety disorder Sister    Depression Sister    Anxiety disorder Sister    Depression Sister    Bipolar disorder Sister    Alcohol abuse Sister    Drug abuse Sister    Heart attack Brother     DRUG ALLERGIES:   Allergies  Allergen Reactions   Codeine Palpitations, Nausea Only, Nausea And Vomiting, Rash and Shortness Of Breath    "makes heart fly, she gets flushed and passes out"   Propoxyphene Rash and Shortness Of Breath    Increase heart rate   Sulfa Antibiotics Palpitations, Nausea Only, Shortness Of Breath and Hives    "makes heart fly, she gets flushed and passes out"   Hydrocodone Nausea And Vomiting    Hear racing & breaks out into a cold sweat.    REVIEW OF SYSTEMS:   ROS As per history of present illness. All pertinent systems were reviewed above. Constitutional,  HEENT, cardiovascular, respiratory, GI, GU, musculoskeletal, neuro, psychiatric, endocrine,  integumentary and hematologic systems were reviewed and are otherwise  negative/unremarkable except for positive findings mentioned above in the HPI.   MEDICATIONS AT HOME:   Prior to Admission medications   Medication Sig Start Date End Date Taking? Authorizing Provider  ARIPiprazole (ABILIFY) 5 MG tablet TAKE 1 TABLET BY MOUTH ONCE DAILY. 02/22/19   Clapacs, Jackquline Denmark, MD  atorvastatin (LIPITOR) 80 MG tablet Take by mouth. 02/09/13   [provider]  B-D INSULIN SYRINGE 1CC/25G 25G X 5/8" 1 ML MISC 1 UNITS BY MISCELLANEOUS ROUTE EVERY SEVEN (7) DAYS. TO BE USED WITH METHOTREXATE 12/10/16   [provider]  busPIRone (BUSPAR) 10 MG tablet TAKE  1 TABLET BY MOUTH TWICE DAILY 02/05/19   Clapacs, Jackquline Denmark, MD  Calcium Carb-Ergocalciferol 250-125 MG-UNIT TABS Take by mouth.    [provider]  Cholecalciferol (VITAMIN D3) 125 MCG (5000 UT) CAPS Take 1 capsule (5,000 Units total) by mouth daily with breakfast. Take along with calcium and magnesium. 02/14/19 02/14/20  Delano Metz, MD  clotrimazole-betamethasone (LOTRISONE) cream Apply 1 application topically 2 (two) times daily. For yeast infection under stomach 05/07/11   [provider]  Diclofenac Sodium  3 % GEL APPLY 1 APPLICATION TOPICALLY TWO (2) TIMES A DAY. 06/21/17   [provider]  FLUoxetine (PROZAC) 20 MG capsule TAKE (1) CAPSULE BY MOUTH ONCE DAILY. 11/30/18   Clapacs, Madie Reno, MD  folic acid (FOLVITE) 1 MG tablet Take 1 mg by mouth daily.     [provider]  hydroxychloroquine (PLAQUENIL) 200 MG tablet Take 200 mg by mouth 2 (two) times daily.    [provider]  hydrOXYzine (ATARAX/VISTARIL) 50 MG tablet Take 50 mg by mouth Twice daily.  07/07/11   [provider]  Insulin Degludec (TRESIBA) 100 UNIT/ML SOLN Inject 65 Units into the skin daily.    [provider]  insulin lispro (HUMALOG) 100 UNIT/ML injection Inject 10u AC TID and add SSI: <150=0u, 151-200=2u, 201-250=4u, 251-300=6u, >300mg /dl=8u. Up to 60u daily. 10/31/17   [provider]  KLOR-CON M20 20 MEQ tablet TAKE ONE TABLET BY MOUTH TWICE DAILY Patient taking differently: take 2 tabs in the a.m. , 1 tab in the evening 04/10/13   Wellington Hampshire, MD  leflunomide (ARAVA) 10 MG tablet Take 10 mg by mouth daily. 04/27/17   [provider]  levothyroxine (SYNTHROID, LEVOTHROID) 88 MCG tablet Take 88 mcg by mouth daily before breakfast.    [provider]  lisinopril (PRINIVIL,ZESTRIL) 2.5 MG tablet Take 2.5 mg by mouth daily.    [provider]  loperamide (IMODIUM) 2 MG capsule Take 2 mg by mouth as needed.  06/21/16   [provider]  lovastatin (MEVACOR) 40 MG tablet Take 40 mg by mouth daily.     [provider]  metFORMIN (GLUCOPHAGE) 500 MG tablet Take 500 mg by mouth 2 (two) times daily with a meal.     [provider]  methotrexate 50 MG/2ML injection Inject into the muscle.    [provider]  oxyCODONE (OXY IR/ROXICODONE) 5 MG immediate release tablet Take 1 tablet (5 mg total) by mouth every 6 (six) hours as needed for severe pain. Must last 30 days 02/14/19 03/16/19  Milinda Pointer, MD  oxyCODONE (OXY IR/ROXICODONE) 5 MG immediate release tablet Take 1 tablet (5 mg total) by mouth every 6 (six) hours as needed for severe pain. Must last 30 days 03/16/19 04/15/19  Milinda Pointer, MD  oxyCODONE (OXY IR/ROXICODONE) 5 MG immediate release tablet Take 1 tablet (5 mg total) by mouth every 6 (six) hours as needed for severe pain. Must last 30 days 04/15/19 05/15/19  Milinda Pointer, MD  phenazopyridine (PYRIDIUM) 200 MG tablet Take 1 tablet (200 mg total) by mouth 3 (three) times daily. 05/16/18   Lorin Picket, PA-C  pregabalin (LYRICA) 150 MG capsule Take 1 capsule (150 mg total) by mouth 3 (three) times daily. 02/14/19 08/13/19  Milinda Pointer, MD  promethazine (PHENERGAN) 25 MG tablet TAKE 1 TABLET BY MOUTH EVERY 6 HOURS AS NEEDED NAUSEA OR VOMITING 10/18/17   [provider]  QUEtiapine (SEROQUEL) 300 MG tablet TAKE ONE TABLET BY MOUTH AT BEDTIME. 02/05/19   Clapacs, Madie Reno, MD  Vitamin D, Ergocalciferol, (DRISDOL) 50000 units CAPS capsule Take by mouth.    [provider]  zolpidem (AMBIEN) 5 MG tablet TAKE 1 TABLET BY MOUTH EVERYDAY AT BEDTIME 11/30/18   Clapacs, Madie Reno, MD      VITAL SIGNS:  Blood pressure 105/62, pulse 93, temperature (!) 102.6 F (39.2 C), temperature source Oral, resp. rate 17, height 5\' 6"  (1.676 m), weight 127 kg, last menstrual period 04/20/2001, SpO2 95 %.  PHYSICAL EXAMINATION:  Physical Exam  GENERAL:  58  y.o.-year-old obese Caucasian female patient lying in the bed in mild respiratory distress with conversational dyspnea. EYES: Pupils equal, round, reactive to light and accommodation. No scleral icterus. Extraocular muscles intact.  HEENT: Head atraumatic, normocephalic. Oropharynx and nasopharynx clear.  NECK:  Supple, no jugular venous distention. No thyroid enlargement, no tenderness.  LUNGS: Diminished bibasilar breath sounds with bibasal and midlung zone crackles. CARDIOVASCULAR: Regular rate and rhythm, S1, S2 normal. No murmurs, rubs, or gallops.  ABDOMEN: Soft, nondistended, nontender. Bowel sounds present. No organomegaly or mass.  EXTREMITIES: Trace bilateral lower extremity pitting edema, with no cyanosis, or clubbing.  NEUROLOGIC: Cranial nerves II through XII are intact. Muscle strength 5/5 in all extremities. Sensation intact. Gait not checked.  PSYCHIATRIC: The patient is alert and oriented x 3.  Normal affect and good eye contact. SKIN: No obvious rash, lesion, or ulcer.   LABORATORY PANEL:   CBC Recent Labs  Lab 03/27/19 0002  WBC 4.9  HGB 11.9*  HCT 36.7  PLT PLATELET CLUMPS NOTED ON SMEAR, UNABLE TO ESTIMATE   ------------------------------------------------------------------------------------------------------------------  Chemistries  Recent Labs  Lab 03/27/19 0002  NA 132*  K 4.2  CL 95*  CO2 26  GLUCOSE 306*  BUN 22*  CREATININE 1.66*  CALCIUM 9.0  AST 58*  ALT 47*  ALKPHOS 82  BILITOT 0.5   ------------------------------------------------------------------------------------------------------------------  Cardiac Enzymes No results for input(s): TROPONINI in the last 168 hours. ------------------------------------------------------------------------------------------------------------------  RADIOLOGY:  Dg Chest Port 1 View  Result Date: 03/26/2019 CLINICAL DATA:  Nonproductive cough. Patient reports COVID-19 positive. EXAM: PORTABLE CHEST 1  VIEW COMPARISON:  Radiograph 05/27/2014, CT 07/07/2012 FINDINGS: Patient is rotated. Upper normal heart size with unchanged mediastinal contours allowing for rotation. Pericardial opacity corresponds to a prominent pericardial fat pad on CT. Minimal streaky bibasilar opacities. No pulmonary edema, pleural effusion or pneumothorax. No acute osseous abnormalities are seen. IMPRESSION: Minimal streaky bibasilar opacities. Findings favor atelectasis, however may represent atypical viral pneumonia in the setting of COVID-19. Electronically Signed   By: Narda Rutherford M.D.   On: 03/26/2019 23:39      IMPRESSION AND PLAN:   1.  Acute hypoxic respiratory failure secondary to pneumonia likely due to COVID-19.  The patient will be admitted to a medical monitored bed.  Will obtain blood and sputum cultures and place on IV Rocephin and Zithromax pending cultures results.  We will place the patient on IV remdesivir that was started in the ER as well as IV Decadron specially if he has elevated inflammatory markers, mainly CRP.    I believe that her chest pain is related to excessive cough.  We will obtain a D-dimer and place the patient on anticoagulation if elevated pending chest CTA with improvement of her renal functions.  I discussed convalescent plasma with the patient with benefits and risks and she desires to  proceed with it. She will be placed on vitamin D3, aspirin, zinc and vitamin C. We will follow serial CBCs with differential as well as CMP and troponin I.  2.  Acute kidney injury.  The patient will be hydrated with IV normal saline and will follow her BMP.  This is likely prerenal from volume depletion and dehydration from anorexia.  It is manifested also by hyponatremia and hypochloremia and possibly mild elevated LFTs..  2.   Uncontrolled type II diabetes mellitus with hyperglycemia.  She will be placed on supplement coverage with NovoLog we will continue her basal coverage.  We will hold off  Metformin.  3.  Rheumatoid arthritis.  We may need to hold Plaquenil while giving remdesivir for optimal antiviral activity.  We will continue Arava and methotrexate.  4.  COPD.  No current exacerbation.  We will avoid nebulization if not needed.  She has no current wheezing and if she does we will use MDIs.  5.  Hypothyroidism.  We will continue Synthroid and check TSH.  6.  Peripheral neuropathy.  We will continue Neurontin.  7.  Dyslipidemia.  We will continue statin therapy.  8.  Bipolar disorder and anxiety.  We will continue Abilify, Seroquel and BuSpar.  9.  DVT prophylaxis.  SCDs for now pending D-dimer level.  The patient had a significant reaction in the past with Lovenox so we will hold off heparin products for now.  All the records are reviewed and case discussed with ED provider. The plan of care was discussed in details with the patient (and family). I answered all questions. The patient agreed to proceed with the above mentioned plan. Further management will depend upon hospital course.   CODE STATUS: I have discussed the CODE STATUS with the patient and she desires to be full code  TOTAL TIME TAKING CARE OF THIS PATIENT: 60 minutes.    Hannah BeatJan A Trudy Kory M.D on 03/27/2019 at 5:47 AM  [facility]   From 7 PM-7 AM, contact night-coverage [email or web address]  CC: Primary care physician; [facility]   Note: This dictation was prepared with Dragon dictation along with smaller phrase technology. Any transcriptional errors that result from this process are unintentional.

## 2019-03-27 NOTE — Plan of Care (Signed)
  Problem: Education: Goal: Knowledge of risk factors and measures for prevention of condition will improve Outcome: Progressing   Problem: Coping: Goal: Psychosocial and spiritual needs will be supported Outcome: Progressing   Problem: Respiratory: Goal: Will maintain a patent airway Outcome: Progressing Goal: Complications related to the disease process, condition or treatment will be avoided or minimized Outcome: Progressing   

## 2019-03-27 NOTE — ED Notes (Signed)
Pt states she is allergic to lovenox and refuses injection

## 2019-03-27 NOTE — ED Notes (Signed)
Placed pt on bed pan. Pt pulled bed pan out of bed and put it on floor, said she couldn't do anything. I reposition pt and got her comfortable with call bell within reach.

## 2019-03-27 NOTE — ED Notes (Signed)
MD paged to inform about blood sugar

## 2019-03-27 NOTE — ED Notes (Addendum)
Pt resting on stretcher. Reports body aches and frequent cough present. No increased work of breathing noted at this time. Provided for comfort and safety. Reminded to not get up without assistance. Pt shown where call bell is located on side of stretcher and instructed to press it if she needs help with anything. Verbalized understanding.

## 2019-03-27 NOTE — ED Notes (Signed)
Admitting MD at the bedside for pt evaluation.  

## 2019-03-27 NOTE — H&P (Addendum)
History and Physical    [name] [name] HKV:[id number] DOB: 05/08/1960 DOA: 03/27/2019  PCP: [facility]   Patient coming from: [facility]   Chief Complaint:  Dyspnea   HPI: [name] [name] is a 58 y.o. female with medical history significant of chronic diastolic heart failure, type 2 diabetes mellitus, GERD, chronic arthritis, hypothyroidism, sleep apnea and obesity.  Patient tested positive for COVID-19 December 4.  Her symptoms have been consistent with generalized weakness, poor appetite, intermittent fevers and chills, positive cough, dyspnea, nausea and vomiting.  On her initial physical examination her oximetry was 60% on room air, blood pressure 105/62, pulse rate 93, temperature 39.2 C, respiratory rate 17, oxygen saturation 95% on supplemental oxygen.  She had diminished breath sounds bilaterally, heart S1-S2 present rhythmic, abdomen soft nontender, trace lower extremity edema. Sodium 132, potassium 4.2, chloride 95, bicarb 26, glucose 306, BUN 22, creatinine 1.66, white count 4.9, hemoglobin 11.9, hematocrit 36.7, platelets clumped.  Influenza A, influenza B negative.  Urinalysis more than 500 glucose, specific gravity 1.016, 30 protein.  Urine drug screen positive for opiates and tricyclics.  Chest x-ray had right base atelectasis and a faint left lower lobe interstitial infiltrate (personally reviewed), EKG 95 bpm, normal axis, normal intervals, sinus rhythm, positive PACs, no ST segment or T wave changes.  Patient was admitted to the hospital working diagnosis of acute hypoxic respiratory failure due to SARS COVID-19 viral pneumonia.  Patient complains of dyspnea, moderate in intensity, worse with exertion, associated with cough, generalized weakness and decreased appetite.  No improving factors.  It has been associated with anxiety.  Apparently 9 days ago she had a positive contact at the church.   ED Course: Patient was diagnosed with acute hypoxic respiratory failure due to  SARS Covid 19 viral pneumonia at [facility], she received steroids and remdesivir, transferred to [facility] December 8.  Review of Systems:  1. General: positive fevers, and chills, no weight gain or weight loss 2. ENT: No runny nose or sore throat, no hearing disturbances 3. Pulmonary: positive dyspnea, cough, but no wheezing, or hemoptysis 4. Cardiovascular: No angina, claudication, lower extremity edema, pnd or orthopnea 5. Gastrointestinal: No nausea or vomiting, no diarrhea or constipation 6. Hematology: No easy bruisability or frequent infections 7. Urology: No dysuria, hematuria or increased urinary frequency 8. Dermatology: No rashes. 9. Neurology: No seizures or paresthesias 10. Musculoskeletal: No joint pain or deformities  Past Medical History:  Diagnosis Date   Abdominal wall hernia 01/29/2013   Anxiety    Arthritis    Rheumatoid   C. difficile colitis    Chronic diastolic heart failure (HCC)    Depression    Diabetes mellitus    states no meds or diet restrictions  at present   Diastolic CHF (HCC)    Esophagitis    Fluid retention    GERD (gastroesophageal reflux disease)    Hiatal hernia    Hypertension    Hypokalemia due to loss of potassium 10/21/2015   Overview:  Associated with 3 weeks of diarrhea  And QT prolongation.   Hypothyroidism    IBS (irritable bowel syndrome)    Moderate episode of recurrent major depressive disorder (HCC) 06/03/2004   Morbid obesity (HCC)    MRSA (methicillin resistant Staphylococcus aureus) infection 11/2017   left inner thigh abcess   Neurogenic bladder    has pacemaker   Neuropathy    Obesity    Panic attacks    Rheumatoid arthritis (HCC)  Sleep apnea    STATES SEVERE, CANT TOLERATE MASK- LAST STUDY YEARS AGO    Past Surgical History:  Procedure Laterality Date   ABDOMINAL HYSTERECTOMY     CHOLECYSTECTOMY     DG GREAT TOE RIGHT FOOT  02/23/2018   EYE SURGERY     bilateral cataract  extraction with IOL   HERNIA REPAIR     ventral hernia with strangulation   LAPAROSCOPIC GASTRIC BANDING  03/20/07   TONSILLECTOMY     TUBAL LIGATION       reports that she quit smoking about 19 years ago. Her smoking use included cigarettes. She has a 54.00 pack-year smoking history. She has never used smokeless tobacco. She reports that she does not drink alcohol or use drugs.  Allergies  Allergen Reactions   Codeine Palpitations, Nausea Only, Nausea And Vomiting, Rash and Shortness Of Breath    "makes heart fly, she gets flushed and passes out"   Propoxyphene Rash and Shortness Of Breath    Increase heart rate   Sulfa Antibiotics Palpitations, Nausea Only, Shortness Of Breath and Hives    "makes heart fly, she gets flushed and passes out"   Lovenox [Enoxaparin Sodium]    Hydrocodone Nausea And Vomiting    Hear racing & breaks out into a cold sweat.    Family History  Problem Relation Age of Onset   Heart failure Father    Bipolar disorder Father    Alcohol abuse Father    Anxiety disorder Father    Depression Father    Heart disease Brother    Heart attack Brother [id number]       MI s/p stents placed   Anxiety disorder Sister    Depression Sister    Anxiety disorder Sister    Depression Sister    Bipolar disorder Sister    Alcohol abuse Sister    Drug abuse Sister    Heart attack Brother      Prior to Admission medications   Medication Sig Start Date End Date Taking? Authorizing Provider  ARIPiprazole (ABILIFY) 5 MG tablet TAKE 1 TABLET BY MOUTH ONCE DAILY. 02/22/19   Clapacs, Jackquline DenmarkJohn T, MD  atorvastatin (LIPITOR) 80 MG tablet Take by mouth. 02/09/13   [provider]  busPIRone (BUSPAR) 10 MG tablet TAKE 1 TABLET BY MOUTH TWICE DAILY 02/05/19   Clapacs, Jackquline DenmarkJohn T, MD  Calcium Carb-Ergocalciferol 250-125 MG-UNIT TABS Take by mouth.    [provider]  Cholecalciferol (VITAMIN D3) 125 MCG (5000 UT) CAPS Take 1 capsule (5,000 Units total)  by mouth daily with breakfast. Take along with calcium and magnesium. 02/14/19 02/14/20  Delano MetzNaveira, Francisco, MD  FLUoxetine (PROZAC) 20 MG capsule TAKE (1) CAPSULE BY MOUTH ONCE DAILY. 11/30/18   Clapacs, Jackquline DenmarkJohn T, MD  folic acid (FOLVITE) 1 MG tablet Take 1 mg by mouth daily.     [provider]  levothyroxine (SYNTHROID, LEVOTHROID) 88 MCG tablet Take 88 mcg by mouth daily before breakfast.    [provider]  lisinopril (PRINIVIL,ZESTRIL) 2.5 MG tablet Take 2.5 mg by mouth daily.    [provider]  metFORMIN (GLUCOPHAGE) 500 MG tablet Take 500 mg by mouth 2 (two) times daily with a meal.     [provider]  methotrexate 50 MG/2ML injection Inject into the muscle.    [provider]  oxyCODONE (OXY IR/ROXICODONE) 5 MG immediate release tablet Take 1 tablet (5 mg total) by mouth every 6 (six) hours as needed for severe pain. Must last  30 days 03/16/19 04/15/19  Milinda Pointer, MD  oxyCODONE (OXY IR/ROXICODONE) 5 MG immediate release tablet Take 1 tablet (5 mg total) by mouth every 6 (six) hours as needed for severe pain. Must last 30 days 04/15/19 05/15/19  Milinda Pointer, MD  pramipexole (MIRAPEX) 0.125 MG tablet Take 0.125 mg by mouth 2 (two) times daily. 03/08/19   [provider]  pregabalin (LYRICA) 150 MG capsule Take 1 capsule (150 mg total) by mouth 3 (three) times daily. 02/14/19 08/13/19  Milinda Pointer, MD  QUEtiapine (SEROQUEL) 300 MG tablet TAKE ONE TABLET BY MOUTH AT BEDTIME. 02/05/19   Clapacs, Madie Reno, MD  VESICARE 5 MG tablet Take 5 mg by mouth daily. 03/16/19   [provider]  zolpidem (AMBIEN) 5 MG tablet TAKE 1 TABLET BY MOUTH EVERYDAY AT BEDTIME 11/30/18   Clapacs, Madie Reno, MD    Physical Exam: There were no vitals filed for this visit.  There were no vitals filed for this visit. General:  Deconditioned and ill looking appearing  Neurology: Awake and alert, non focal Head and Neck. Head normocephalic.  Neck supple with no adenopathy or thyromegaly.   E ENT: mild pallor, no icterus, oral mucosa moist Cardiovascular: No JVD. S1-S2 present, rhythmic. Non pitting  lower extremity edema. Pulmonary: positive breath sounds bilaterally. Gastrointestinal. Abdomen protuberant with no organomegaly, non tender, no rebound or guarding Skin. No rashes Musculoskeletal: no joint deformities    Labs on Admission: I have personally reviewed following labs and imaging studies  CBC: Recent Labs  Lab 03/27/19 0002  WBC 4.9  HGB 11.9*  HCT 36.7  MCV 81.9  PLT PLATELET CLUMPS NOTED ON SMEAR, UNABLE TO ESTIMATE   Basic Metabolic Panel: Recent Labs  Lab 03/27/19 0002 03/27/19 1010  NA 132* 127*  K 4.2 5.1  CL 95* 95*  CO2 26 22  GLUCOSE 306* 533*  BUN 22* 30*  CREATININE 1.66* 1.62*  CALCIUM 9.0 7.7*   GFR: Estimated Creatinine Clearance: 52.3 mL/min (A) (by C-G formula based on SCr of 1.62 mg/dL (H)). Liver Function Tests: Recent Labs  Lab 03/27/19 0002  AST 58*  ALT 47*  ALKPHOS 82  BILITOT 0.5  PROT 8.2*  ALBUMIN 3.8   No results for input(s): LIPASE, AMYLASE in the last 168 hours. No results for input(s): AMMONIA in the last 168 hours. Coagulation Profile: No results for input(s): INR, PROTIME in the last 168 hours. Cardiac Enzymes: No results for input(s): CKTOTAL, CKMB, CKMBINDEX, TROPONINI in the last 168 hours. BNP (last 3 results) No results for input(s): PROBNP in the last 8760 hours. HbA1C: Recent Labs    03/27/19 0610  HGBA1C 10.7*   CBG: Recent Labs  Lab 03/27/19 0908 03/27/19 0951 03/27/19 1247  GLUCAP 513* 521* 510*   Lipid Profile: No results for input(s): CHOL, HDL, LDLCALC, TRIG, CHOLHDL, LDLDIRECT in the last 72 hours. Thyroid Function Tests: Recent Labs    03/27/19 0610  TSH 1.424   Anemia Panel: Recent Labs    03/27/19 0002  FERRITIN 219   Urine analysis:    Component Value Date/Time   COLORURINE YELLOW (A) 03/27/2019 0002    APPEARANCEUR HAZY (A) 03/27/2019 0002   APPEARANCEUR Clear 11/30/2013 1010   LABSPEC 1.016 03/27/2019 0002   LABSPEC 1.015 11/30/2013 1010   PHURINE 5.0 03/27/2019 0002   GLUCOSEU >=500 (A) 03/27/2019 0002   GLUCOSEU >=500 11/30/2013 1010   HGBUR NEGATIVE 03/27/2019 0002   BILIRUBINUR NEGATIVE 03/27/2019 0002   BILIRUBINUR Negative 11/30/2013 1010  KETONESUR NEGATIVE 03/27/2019 0002   PROTEINUR 30 (A) 03/27/2019 0002   UROBILINOGEN 0.2 03/14/2007 1000   NITRITE NEGATIVE 03/27/2019 0002   LEUKOCYTESUR NEGATIVE 03/27/2019 0002   LEUKOCYTESUR Negative 11/30/2013 1010    Radiological Exams on Admission: Dg Chest Port 1 View  Result Date: 03/26/2019 CLINICAL DATA:  Nonproductive cough. Patient reports COVID-19 positive. EXAM: PORTABLE CHEST 1 VIEW COMPARISON:  Radiograph 05/27/2014, CT 07/07/2012 FINDINGS: Patient is rotated. Upper normal heart size with unchanged mediastinal contours allowing for rotation. Pericardial opacity corresponds to a prominent pericardial fat pad on CT. Minimal streaky bibasilar opacities. No pulmonary edema, pleural effusion or pneumothorax. No acute osseous abnormalities are seen. IMPRESSION: Minimal streaky bibasilar opacities. Findings favor atelectasis, however may represent atypical viral pneumonia in the setting of COVID-19. Electronically Signed   By: Narda RutherfordMelanie  Sanford M.D.   On: 03/26/2019 23:39    EKG: EKG 95 bpm, normal axis, normal intervals, sinus rhythm, positive PACs, no ST segment or T wave changes.   Assessment/Plan Principal Problem:   Pneumonia due to COVID-19 virus Active Problems:   Anxiety   COPD (chronic obstructive pulmonary disease) (HCC)   Essential (primary) hypertension   Chronic knee pain (Bilateral) (R>L)   Long term current use of opiate analgesic   Neurogenic pain   Diabetic peripheral neuropathy (HCC)   Obesity   Osteoarthritis, multiple sites   1.  Acute hypoxic respiratory failure due to SARS COVID-19 viral pneumonia.     RR: 16  Pulse oxymetry: 99%  Fi02: 4 LPM per [place]  COVID-19 Labs  Recent Labs    03/27/19 0002  FERRITIN 219  LDH 172  CRP 4.4*    Lab Results  Component Value Date   SARSCOV2NAA Detected (A) 03/23/2019   Will continue medical therapy with remdesivir and systemic corticosteroids with dexamethasone 6 mg intravenously every 24 hours.  Will add bronchodilator therapy, antitussive agents, and airway clearing techniques with symptom spirometer and flutter valve.  Follow-up inflammatory markers, will target oxygen saturation greater than 88%.  2.  Uncontrolled type 2 diabetes mellitus (hemoglobin A1c 10.7), complicated with steroid-induced hyperglycemia/dyslipidemia.  Serum glucose 533, capillary glucose 510, 483, 471.  Will continue basal insulin therapy with 10 units of Levemir, insulin sliding scale moderate intensity.  Continue close follow-up of serum glucose.  Continue atorvastatin.  3.  Acute kidney injury.  Corrected sodium for glucose 134, will continue hydration with lactated Ringer's at 50 mL/h.  Follow-up kidney function in the morning.  4.  Chronic pain syndrome, peripheral neuropathy, anxiety, bipolar disorder.  Patient very anxious at this time, we will continue her home regimen with Abilify, buspirone, fluoxetine, pregabalin, and Seroquel.  Patient will need physical therapy once clinically more stable.  5.  Hypertension.  Continue blood pressure control with lisinopril.  6.  COPD/obstructive sleep apnea.  No signs of clinical exacerbation, continue bronchodilator therapy.  7.  Hypothyroidism.  Continue levothyroxine.  8.  Morbid obesity.  Will need outpatient follow-up.  DVT prophylaxis:  Enoxaparin  Code Status: Full Family Communication: No family at bedside Disposition Plan: Medical ward Consults called: None Admission status: Inpatient   Mansel Strother Annett Gulaaniel Jovahn Breit MD [facility]   03/27/2019, 2:19 PM

## 2019-03-27 NOTE — ED Notes (Signed)
Pt continues to remove [place] despite redirection and re-education on why she is receiving oxygen . O2 placed back onto pt. Pt has had no urine output since starting fluids. Will continue to monitor. Pt was repositioned in bed

## 2019-03-27 NOTE — Progress Notes (Signed)
PHARMACIST - PHYSICIAN COMMUNICATION  CONCERNING:  Enoxaparin (Lovenox) for DVT Prophylaxis    RECOMMENDATION: Patient was prescribed enoxaprin 40mg  q24 hours for VTE prophylaxis.   Filed Weights   03/26/19 2302  Weight: 280 lb (127 kg)    Body mass index is 45.19 kg/m.  Estimated Creatinine Clearance: 51 mL/min (A) (by C-G formula based on SCr of 1.66 mg/dL (H)).   Based on [place] patient is candidate for enoxaparin 40mg  every 12 hour dosing due to BMI being >40.  DESCRIPTION: Pharmacy has adjusted enoxaparin dose per [facility] policy.  Patient is now receiving enoxaparin 40mg  every 12 hours.   Ena Dawley, PharmD Clinical Pharmacist  03/27/2019 4:54 AM

## 2019-03-27 NOTE — ED Notes (Signed)
Spoke with dr Manuella Ghazi, VORB for 20 units insulin r/t blood sugar.

## 2019-03-27 NOTE — ED Notes (Signed)
Assisted pt to bathroom w/ Val RN

## 2019-03-27 NOTE — Progress Notes (Signed)
Patient was admitted earlier this morning for Covid pneumonia.  She is requiring 4 L oxygen via nasal cannula.  Please see Dr. Dalene Seltzer dictated history and physical for further details.  She is being transferred to [facility].  Patient is in agreement.  Time spent - 15 minutes

## 2019-03-27 NOTE — Progress Notes (Signed)
Remdesivir - Pharmacy Brief Note   O:  ALT:  CXR:  SpO2: % on    A/P:  Remdesivir 200 mg IVPB once followed by 100 mg IVPB daily x 4 days.   Hart Robinsons, PharmD Clinical Pharmacist 03/27/2019   03/27/2019 2:48 AM

## 2019-03-27 NOTE — Progress Notes (Signed)
Received verbal order from Dr. Cathlean Sauer for additional 10 units of Novolog for BG of 491.  Retest ordered in 30 minutes after administration.

## 2019-03-27 NOTE — ED Notes (Signed)
Updated daughter with pt permission.

## 2019-03-27 NOTE — Telephone Encounter (Signed)
Called to Discuss with patient about Covid symptoms and the use of bamlanivimab, a monoclonal antibody infusion for those with mild to moderate Covid symptoms and at a high risk of hospitalization.     Pt is qualified for this infusion at the [facility] infusion center due to co-morbid conditions and/or a member of an at-risk group.    Patient Active Problem List   Diagnosis Date Noted  . COVID-19 03/27/2019  . Abdominal pain 02/12/2019  . AKI (acute kidney injury) ([place]) 02/12/2019  . Cellulitis, abdominal wall 02/04/2019  . Elevated C-reactive protein (CRP) 12/15/2018  . Elevated sed rate 12/15/2018  . Pharmacologic therapy 11/06/2018  . Disorder of skeletal system 11/06/2018  . Problems influencing health status 11/06/2018  . Decubitus ulcer of heel, bilateral 02/21/2018  . Diabetic ulcer of toe of right foot associated with type 2 diabetes mellitus ([name]) 02/14/2018  . Ulcer of left heel and midfoot with fat layer exposed ([place]) 02/14/2018  . MRSA (methicillin resistant Staphylococcus aureus) infection 11/17/2017  . Cellulitis 11/15/2017  . Perineal abscess 11/15/2017  . Subacute vulvitis 11/01/2017  . Chronic cystitis 03/01/2017  . Chronic pain syndrome 03/31/2016  . Insomnia secondary to chronic pain 03/31/2016  . Chronic upper back pain 12/25/2015  . Chronic hand pain (Bilateral) (L>R) 12/25/2015  . Rheumatoid arthritis ([place]) 12/25/2015  . Osteoarthritis, multiple sites 12/24/2015  . Chronic foot pain (Primary Area of Pain) (Bilateral) (L>R) 12/24/2015  . Chronic elbow pain (Third area of Pain) (Bilateral) (L>R) 12/24/2015  . Chronic shoulder pain (Bilateral) (L>R) 12/24/2015  . Chronic neck pain (Bilateral) (R>L) 12/24/2015  . 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[employer]) 12/23/2015  . Chronic knee pain (Bilateral) (R>L) 12/23/2015  . Long term current use of opiate analgesic 12/23/2015  . Long term prescription opiate use 12/23/2015  . Opiate use (30  MME/Day) 12/23/2015  . Neurogenic pain 12/23/2015  . Neuropathic pain 12/23/2015  . Diabetic peripheral neuropathy ([place]) 12/23/2015  . Encounter for therapeutic drug level monitoring 12/23/2015  . Encounter for pain management planning 12/23/2015  . GERD (gastroesophageal reflux disease) 11/25/2015  . Neuropathy 11/25/2015  . Diarrhea 10/22/2015  . Hypokalemia 10/21/2015  . Hypomagnesemia 10/21/2015  . QT prolongation 10/21/2015  . Osteomyelitis due to type 2 diabetes mellitus ([place]) 10/21/2015  . Chronic wrist pain (Secondary area of Pain) (Bilateral) (L>R) 03/19/2015  . 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 Female genuine stress incontinence 02/14/2015  . Urge incontinence of urine 02/14/2015  . Obstructive apnea 07/03/2014  . Chronic diastolic CHF (congestive heart failure), NYHA class 3 ([place]) 07/03/2014  . Anxiety 01/03/2014  . Diabetic ulcer of heel ([place]) 01/03/2014  . COPD (chronic obstructive pulmonary disease) ([place]) 01/03/2014  . Bipolar disorder, unspecified ([place]) 01/03/2014  . Diastolic dysfunction 35/00/9381  . Combined fat and carbohydrate induced hyperlipemia 01/03/2014  . Shortness of breath 01/03/2014  . Acute on chronic congestive heart failure ([place]) 01/03/2014  . Incomplete bladder emptying 11/02/2012  . Bladder retention 10/10/2012  . Detrusor muscle hypertonia 10/04/2012  . Obstruction of urinary tract 10/04/2012  . FOM (frequency of micturition) 10/04/2012  . Mixed incontinence 10/04/2012  . Vitamin D insufficiency 04/15/2012  . Borderline personality disorder ([place]) 01/06/2012  . Mixed anxiety depressive disorder 01/06/2012  . History of laparoscopic adjustable gastric banding, 03/20/2007.  Removed 09/19/2011. 08/04/2011  . Nausea & vomiting 08/04/2011  . Hypothyroidism 06/28/2010  . Rheumatoid arthritis flare ([place]) 06/28/2010  . Obesity 03/29/2005  . Essential (primary) hypertension 01/27/2005  . Major depressive disorder, recurrent episode,  moderate (HCC)  06/03/2004      Unable to reach pt

## 2019-03-28 LAB — COMPREHENSIVE METABOLIC PANEL WITH GFR
ALT: 31 U/L (ref 0–44)
AST: 29 U/L (ref 15–41)
Albumin: 3.2 g/dL — ABNORMAL LOW (ref 3.5–5.0)
Alkaline Phosphatase: 59 U/L (ref 38–126)
Anion gap: 10 (ref 5–15)
BUN: 38 mg/dL — ABNORMAL HIGH (ref 6–20)
CO2: 25 mmol/L (ref 22–32)
Calcium: 8.4 mg/dL — ABNORMAL LOW (ref 8.9–10.3)
Chloride: 99 mmol/L (ref 98–111)
Creatinine, Ser: 1.53 mg/dL — ABNORMAL HIGH (ref 0.44–1.00)
GFR calc Af Amer: 43 mL/min — ABNORMAL LOW
GFR calc non Af Amer: 37 mL/min — ABNORMAL LOW
Glucose, Bld: 287 mg/dL — ABNORMAL HIGH (ref 70–99)
Potassium: 4.4 mmol/L (ref 3.5–5.1)
Sodium: 134 mmol/L — ABNORMAL LOW (ref 135–145)
Total Bilirubin: 0.7 mg/dL (ref 0.3–1.2)
Total Protein: 7 g/dL (ref 6.5–8.1)

## 2019-03-28 LAB — GLUCOSE, CAPILLARY
Glucose-Capillary: 328 mg/dL — ABNORMAL HIGH (ref 70–99)
Glucose-Capillary: 328 mg/dL — ABNORMAL HIGH (ref 70–99)
Glucose-Capillary: 301 mg/dL — ABNORMAL HIGH (ref 70–99)
Glucose-Capillary: 322 mg/dL — ABNORMAL HIGH (ref 70–99)

## 2019-03-28 LAB — FERRITIN: Ferritin: 210 ng/mL (ref 11–307)

## 2019-03-28 LAB — D-DIMER, QUANTITATIVE: D-Dimer, Quant: 1.01 ug{FEU}/mL — ABNORMAL HIGH (ref 0.00–0.50)

## 2019-03-28 LAB — C-REACTIVE PROTEIN: CRP: 9.2 mg/dL — ABNORMAL HIGH (ref <1.0)

## 2019-03-28 MED ORDER — HEPARIN SODIUM (PORCINE) 10000 UNIT/ML IJ SOLN
7500.0000 [IU] | Freq: Three times a day (TID) | INTRAMUSCULAR | Status: DC
  Administered 2019-03-28 – 2019-03-29 (×4): 7500 [IU] via SUBCUTANEOUS
  Filled 2019-03-28 (×4): qty 1

## 2019-03-28 MED ORDER — INSULIN DETEMIR 100 UNIT/ML ~~LOC~~ SOLN
30.0000 [IU] | Freq: Two times a day (BID) | SUBCUTANEOUS | Status: DC
  Administered 2019-03-28: 30 [IU] via SUBCUTANEOUS
  Filled 2019-03-28 (×2): qty 0.3

## 2019-03-28 MED ORDER — INSULIN ASPART 100 UNIT/ML ~~LOC~~ SOLN
0.0000 [IU] | Freq: Three times a day (TID) | SUBCUTANEOUS | Status: DC
  Administered 2019-03-28 – 2019-03-29 (×3): 15 [IU] via SUBCUTANEOUS
  Administered 2019-03-29 (×2): 7 [IU] via SUBCUTANEOUS
  Administered 2019-03-30: 4 [IU] via SUBCUTANEOUS
  Administered 2019-03-30: 11 [IU] via SUBCUTANEOUS
  Administered 2019-03-31: 4 [IU] via SUBCUTANEOUS

## 2019-03-28 MED ORDER — INSULIN ASPART 100 UNIT/ML ~~LOC~~ SOLN
10.0000 [IU] | Freq: Three times a day (TID) | SUBCUTANEOUS | Status: DC
  Administered 2019-03-28 – 2019-03-31 (×9): 10 [IU] via SUBCUTANEOUS

## 2019-03-28 MED ORDER — INSULIN DETEMIR 100 UNIT/ML ~~LOC~~ SOLN
15.0000 [IU] | Freq: Two times a day (BID) | SUBCUTANEOUS | Status: DC
  Administered 2019-03-28: 15 [IU] via SUBCUTANEOUS
  Filled 2019-03-28: qty 0.15

## 2019-03-28 MED ORDER — INSULIN ASPART 100 UNIT/ML ~~LOC~~ SOLN
0.0000 [IU] | Freq: Every day | SUBCUTANEOUS | Status: DC
  Administered 2019-03-28: 4 [IU] via SUBCUTANEOUS
  Administered 2019-03-29: 3 [IU] via SUBCUTANEOUS
  Administered 2019-03-30: 5 [IU] via SUBCUTANEOUS

## 2019-03-28 MED ORDER — SODIUM CHLORIDE 0.9 % IV SOLN
100.0000 mg | Freq: Every day | INTRAVENOUS | Status: AC
  Administered 2019-03-28 – 2019-03-31 (×4): 100 mg via INTRAVENOUS
  Filled 2019-03-28 (×5): qty 20

## 2019-03-28 NOTE — Progress Notes (Signed)
PROGRESS NOTE    [name] [name]  GGE:[id number] DOB: [birth year] DOA: 03/27/2019 PCP: [facility]    Brief Narrative:  Patient is 58 year old female with history of chronic diastolic heart failure, type 2 diabetes on Tresiba, GERD, chronic arthritis, hypothyroidism, sleep apnea and obesity.  She was tested positive for COVID-19 on December 4.  Her symptoms have been consistent with generalized weakness, poor appetite, intermittent fever and chills and worsening nausea and vomiting.  She presented to [facility] emergency room with shortness of breath, was hypoxic 60% on room air, blood pressure stable temperature 39.2 C.  Patient needed more than 4 L of oxygen on resuscitation.  Chest x-ray with right base atelectasis and left lower lobe interstitial infiltrate.  She was ultimately transferred to [place] for treatment of COVID-19 pneumonia.   Assessment & Plan:   Principal Problem:   Pneumonia due to COVID-19 virus Active Problems:   Anxiety   COPD (chronic obstructive pulmonary disease) (HCC)   Essential (primary) hypertension   Chronic knee pain (Bilateral) (R>L)   Long term current use of opiate analgesic   Neurogenic pain   Diabetic peripheral neuropathy (HCC)   Obesity   Osteoarthritis, multiple sites  Acute hypoxemic respiratory failure due to SARS COVID-19 viral pneumonia: Continue to monitor due to significant symptoms  chest physiotherapy, incentive spirometry, deep breathing exercises, sputum induction, mucolytic's and bronchodilators. Supplemental oxygen to keep saturations more than 90%. Covid directed therapy with , steroids, on dexamethasone remdesivir, on remdesivir, day 3/5. antibiotics not indicated. Due to severity of symptoms, patient will need daily inflammatory markers, chest x-rays, liver function test to monitor and direct COVID-19 therapies.  Uncontrolled type 2 diabetes with hyperglycemia: Uncontrolled type 2  diabetes to start with, hemoglobin A1c 10.7.  Blood sugars more than 500 on presentation.  Patient is also on steroid that is aggravating her hyperglycemia. Inception coverage, increased dose of Levemir to 30 units twice a day, add prandial insulin and also changed to high intensity sliding scale insulin.  We will continue to uptitrate as needed.  Acute kidney injury: Stabilizing.  Continue monitoring.  Encourage oral liquids.  Currently no indication to continue IV fluids.  Chronic pain syndrome/peripheral neuropathy/anxiety and bipolar disorder: On multiple regimen at home including Abilify, BuSpar on, paroxetine, pregabalin and Seroquel.  She will continue this.  Hypertension: On lisinopril that she will continue.  Hypothyroidism: On Synthroid.  Clinically euthyroid.  Continue.  Obstructive sleep apnea: Does not use CPAP at home.  Currently CPAP on hold to reduce aerosolization procedure.  DVT prophylaxis: Heparin subcu Code Status: Full code Family Communication: None.  Patient will communicate with her family. Disposition Plan: Home after hospitalization.  Anticipate another 48-72 hours.   Consultants:   None  Procedures:   None  Antimicrobials:   Remdesivir, 03/27/2019---   Subjective: Patient seen and examined.  No overnight events.  On 4 to 5 L of oxygen.  Was sitting in couch in trying to eat breakfast.  Afebrile overnight.  Denies any nausea vomiting cough or sputum production. Blood sugars are very high. Objective: Vitals:   03/27/19 2038 03/28/19 0351 03/28/19 0418 03/28/19 0826  BP:  106/60 (!) 88/54 111/72  Pulse: 78 64 (!) 57 66  Resp: 12 12 16 15   Temp: (!) 97.1 F (36.2 C) (!) 97.1 F (36.2 C) (!) 97.3 F (36.3 C) 97.9 F (36.6 C)  TempSrc: Axillary Axillary Axillary Oral  SpO2: 100% 99% 92% 98%  Weight:  Height:        Intake/Output Summary (Last 24 hours) at 03/28/2019 1321 Last data filed at 03/28/2019 1100 Gross per 24 hour  Intake 400 ml   Output 400 ml  Net 0 ml   Filed Weights   03/27/19 1539  Weight: 128.3 kg    Examination:  General exam: Appears calm and comfortable, eating breakfast. Respiratory system: Clear to auscultation. Respiratory effort normal.  No added sound.  Comfortable on 4 L oxygen. Cardiovascular system: S1 & S2 heard, RRR. No JVD, murmurs, rubs, gallops or clicks. No pedal edema. Gastrointestinal system: Abdomen is nondistended, soft and nontender. No organomegaly or masses felt. Normal bowel sounds heard.  Obese and pendulous. Central nervous system: Alert and oriented. No focal neurological deficits. Extremities: Symmetric 5 x 5 power. Skin: No rashes, lesions or ulcers Psychiatry: Judgement and insight appear normal. Mood & affect appropriate.     Data Reviewed: I have personally reviewed following labs and imaging studies  CBC: Recent Labs  Lab 03/27/19 0002  WBC 4.9  HGB 11.9*  HCT 36.7  MCV 81.9  PLT PLATELET CLUMPS NOTED ON SMEAR, UNABLE TO ESTIMATE   Basic Metabolic Panel: Recent Labs  Lab 03/27/19 0002 03/27/19 1010 03/28/19 0120  NA 132* 127* 134*  K 4.2 5.1 4.4  CL 95* 95* 99  CO2 26 22 25   GLUCOSE 306* 533* 287*  BUN 22* 30* 38*  CREATININE 1.66* 1.62* 1.53*  CALCIUM 9.0 7.7* 8.4*   GFR: Estimated Creatinine Clearance: 55.7 mL/min (A) (by C-G formula based on SCr of 1.53 mg/dL (H)). Liver Function Tests: Recent Labs  Lab 03/27/19 0002 03/28/19 0120  AST 58* 29  ALT 47* 31  ALKPHOS 82 59  BILITOT 0.5 0.7  PROT 8.2* 7.0  ALBUMIN 3.8 3.2*   No results for input(s): LIPASE, AMYLASE in the last 168 hours. No results for input(s): AMMONIA in the last 168 hours. Coagulation Profile: No results for input(s): INR, PROTIME in the last 168 hours. Cardiac Enzymes: No results for input(s): CKTOTAL, CKMB, CKMBINDEX, TROPONINI in the last 168 hours. BNP (last 3 results) No results for input(s): PROBNP in the last 8760 hours. HbA1C: Recent Labs    03/27/19 0610   HGBA1C 10.7*   CBG: Recent Labs  Lab 03/27/19 1729 03/27/19 1924 03/27/19 2200 03/28/19 0829 03/28/19 1214  GLUCAP 491* 423* 332* 328* 328*   Lipid Profile: No results for input(s): CHOL, HDL, LDLCALC, TRIG, CHOLHDL, LDLDIRECT in the last 72 hours. Thyroid Function Tests: Recent Labs    03/27/19 0610  TSH 1.424   Anemia Panel: Recent Labs    03/27/19 0002 03/28/19 0120  FERRITIN 219 210   Sepsis Labs: No results for input(s): PROCALCITON, LATICACIDVEN in the last 168 hours.  Recent Results (from the past 240 hour(s))  Novel Coronavirus, NAA ([facility])     Status: Abnormal   Collection Time: 03/23/19  2:14 PM   Specimen: Nasopharyngeal(NP) swabs in vial transport medium   NASOPHARYNGE  TESTING  Result Value Ref Range Status   SARS-CoV-2, NAA Detected (A) Not Detected Final    Comment: This nucleic acid amplification test was developed and its performance characteristics determined by [employer]. Nucleic acid amplification tests include PCR and TMA. This test has not been FDA cleared or approved. This test has been authorized by FDA under an Emergency Use Authorization (EUA). This test is only authorized for the duration of time the declaration that circumstances exist justifying the authorization of the emergency use of in  vitro diagnostic tests for detection of SARS-CoV-2 virus and/or diagnosis of COVID-19 infection under section 564(b)(1) of the Act, 21 U.S.C. 778EUM-3(N) (1), unless the authorization is terminated or revoked sooner. When diagnostic testing is negative, the possibility of a false negative result should be considered in the context of a patient's recent exposures and the presence of clinical signs and symptoms consistent with COVID-19. An individual without symptoms of COVID-19 and who is not shedding SARS-CoV-2 virus would  expect to have a negative (not detected) result in this assay.   Culture, blood (single) w Reflex to ID Panel      Status: None (Preliminary result)   Collection Time: 03/27/19  6:09 AM   Specimen: BLOOD  Result Value Ref Range Status   Specimen Description BLOOD RIGHT [facility]  Final   Special Requests   Final    BOTTLES DRAWN AEROBIC AND ANAEROBIC Blood Culture adequate volume   Culture   Final    NO GROWTH 1 DAY Performed at [facility], [address], [place], [place] [address]    Report Status PENDING  Incomplete         Radiology Studies: Dg Chest Port 1 View  Result Date: 03/26/2019 CLINICAL DATA:  Nonproductive cough. Patient reports COVID-19 positive. EXAM: PORTABLE CHEST 1 VIEW COMPARISON:  Radiograph 05/27/2014, CT 07/07/2012 FINDINGS: Patient is rotated. Upper normal heart size with unchanged mediastinal contours allowing for rotation. Pericardial opacity corresponds to a prominent pericardial fat pad on CT. Minimal streaky bibasilar opacities. No pulmonary edema, pleural effusion or pneumothorax. No acute osseous abnormalities are seen. IMPRESSION: Minimal streaky bibasilar opacities. Findings favor atelectasis, however may represent atypical viral pneumonia in the setting of COVID-19. Electronically Signed   By: Narda Rutherford M.D.   On: 03/26/2019 23:39        Scheduled Meds: . ARIPiprazole  5 mg Oral Daily  . atorvastatin  80 mg Oral q1800  . busPIRone  10 mg Oral BID  . chlorpheniramine-HYDROcodone  5 mL Oral Q12H  . darifenacin  7.5 mg Oral Daily  . dexamethasone (DECADRON) injection  6 mg Intravenous Q24H  . FLUoxetine  20 mg Oral Daily  . folic acid  1 mg Oral Daily  . heparin  7,500 Units Subcutaneous Q8H  . insulin aspart  0-20 Units Subcutaneous TID WC  . insulin aspart  0-5 Units Subcutaneous QHS  . insulin aspart  10 Units Subcutaneous TID WC  . insulin detemir  30 Units Subcutaneous BID  . Ipratropium-Albuterol  1 puff Inhalation Q6H  . levothyroxine  88 mcg Oral Q0600  . lisinopril  2.5 mg Oral Daily  . polyethylene glycol  17 g Oral BID  .  pramipexole  0.125 mg Oral BID  . pregabalin  150 mg Oral TID  . QUEtiapine  300 mg Oral QHS  . vitamin C  500 mg Oral Daily  . zinc sulfate  220 mg Oral Daily   Continuous Infusions: . remdesivir 100 mg in NS 100 mL 100 mg (03/28/19 0905)     LOS: 1 day    Time spent: 35 minutes    Dorcas Carrow, MD [facility] Pager [phone]

## 2019-03-28 NOTE — Progress Notes (Signed)
Inpatient Diabetes Program Recommendations  AACE/ADA: New Consensus Statement on Inpatient Glycemic Control (2015)  Target Ranges:  Prepandial:   less than 140 mg/dL      Peak postprandial:   less than 180 mg/dL (1-2 hours)      Critically ill patients:  140 - 180 mg/dL   Lab Results  Component Value Date   GLUCAP 332 (H) 03/27/2019   HGBA1C 10.7 (H) 03/27/2019    Review of Glycemic Control Results for [name] [name], [name] [name] (MRN [id number]) as of 03/28/2019 09:51  Ref. Range 03/27/2019 16:30 03/27/2019 17:29 03/27/2019 19:24 03/27/2019 22:00  Glucose-Capillary Latest Ref Range: 70 - 99 mg/dL 471 (H) 491 (H) 423 (H) 332 (H)   Diabetes history: DM 2 Outpatient Diabetes medications:  Tresiba 65-100 units daily Current orders for Inpatient glycemic control:  Levemir 15 units bid, Novolog moderate tid with meals and HS  Inpatient Diabetes Program Recommendations:    Consider increasing Levemir to 35 units bid.  Also consider increasing Novolog to resistant tid with meals and HS.  Also please add Novolog 10 units tid with meals (hold if patient eats less than 50%). Text page sent.   Thanks  Adah Perl, RN, BC-ADM Inpatient Diabetes Coordinator Pager [phone] (8a-5p)

## 2019-03-28 NOTE — Progress Notes (Signed)
Pt supposedly got "hives" from lovenox. D/w Dr. Raelyn Mora and we will use moderate dose SQ heparin instead since she got it before.  Heparin 7500units SQ TID  Onnie Boer, PharmD, BCIDP, AAHIVP, CPP Infectious Disease Pharmacist 03/28/2019 1:11 PM

## 2019-03-29 LAB — COMPREHENSIVE METABOLIC PANEL
ALT: 28 U/L (ref 0–44)
AST: 23 U/L (ref 15–41)
Albumin: 2.9 g/dL — ABNORMAL LOW (ref 3.5–5.0)
Alkaline Phosphatase: 52 U/L (ref 38–126)
Anion gap: 12 (ref 5–15)
BUN: 38 mg/dL — ABNORMAL HIGH (ref 6–20)
CO2: 21 mmol/L — ABNORMAL LOW (ref 22–32)
Calcium: 8.6 mg/dL — ABNORMAL LOW (ref 8.9–10.3)
Chloride: 103 mmol/L (ref 98–111)
Creatinine, Ser: 1.05 mg/dL — ABNORMAL HIGH (ref 0.44–1.00)
GFR calc Af Amer: >60 mL/min (ref >60)
GFR calc non Af Amer: 59 mL/min — ABNORMAL LOW (ref >60)
Glucose, Bld: 251 mg/dL — ABNORMAL HIGH (ref 70–99)
Potassium: 4.3 mmol/L (ref 3.5–5.1)
Sodium: 136 mmol/L (ref 135–145)
Total Bilirubin: 0.4 mg/dL (ref 0.3–1.2)
Total Protein: 6.7 g/dL (ref 6.5–8.1)

## 2019-03-29 LAB — GLUCOSE, CAPILLARY
Glucose-Capillary: 214 mg/dL — ABNORMAL HIGH (ref 70–99)
Glucose-Capillary: 222 mg/dL — ABNORMAL HIGH (ref 70–99)
Glucose-Capillary: 323 mg/dL — ABNORMAL HIGH (ref 70–99)
Glucose-Capillary: 293 mg/dL — ABNORMAL HIGH (ref 70–99)

## 2019-03-29 LAB — FERRITIN: Ferritin: 372 ng/mL — ABNORMAL HIGH (ref 11–307)

## 2019-03-29 LAB — D-DIMER, QUANTITATIVE: D-Dimer, Quant: 1.14 ug/mL-FEU — ABNORMAL HIGH (ref 0.00–0.50)

## 2019-03-29 LAB — C-REACTIVE PROTEIN: CRP: 4.6 mg/dL — ABNORMAL HIGH (ref <1.0)

## 2019-03-29 MED ORDER — INSULIN DETEMIR 100 UNIT/ML ~~LOC~~ SOLN
40.0000 [IU] | Freq: Two times a day (BID) | SUBCUTANEOUS | Status: DC
  Administered 2019-03-29 – 2019-03-31 (×5): 40 [IU] via SUBCUTANEOUS
  Filled 2019-03-29 (×7): qty 0.4

## 2019-03-29 MED ORDER — HEPARIN SODIUM (PORCINE) 5000 UNIT/ML IJ SOLN
7500.0000 [IU] | Freq: Three times a day (TID) | INTRAMUSCULAR | Status: DC
  Administered 2019-03-29 – 2019-03-31 (×5): 7500 [IU] via SUBCUTANEOUS
  Filled 2019-03-29 (×5): qty 2

## 2019-03-29 NOTE — TOC Initial Note (Signed)
Transition of Care [facility]) - Initial/Assessment Note    Patient Details  Name: [name] [name] MRN: [id number] Date of Birth: Apr 03, 1961  Transition of Care [facility]) CM/SW Contact:    Kinsie Belford, Chauncey Reading, RN Phone Number: 03/29/2019, 2:08 PM  Clinical Narrative: "  58 year-old female with history of chronic diastolic heart failure, type 2 diabetes on Tresiba, GERD, chronic arthritis, hypothyroidism, sleep apnea and obesity":   CM consulted for equipment/home health needs.  Chart reviewed, patient is from home, independent.   Patient will need a PT/OT eval to assess equipment/home health needs when appropriate. Per MD notes, patient will need another 48 -72 hours in the hospital.         Expected Discharge Plan: [place] Barriers to Discharge: Continued Medical Work up   Patient Goals and CMS Choice        Expected Discharge Plan and Services Expected Discharge Plan: [place]   Discharge Planning Services: CM Consult   Living arrangements for the past 2 months: Single Family Home                                      Prior Living Arrangements/Services Living arrangements for the past 2 months: Single Family Home Lives with:: Minor Children                   Activities of Daily Living Home Assistive Devices/Equipment: None ADL Screening (condition at time of admission) Patient's cognitive ability adequate to safely complete daily activities?: Yes Is the patient deaf or have difficulty hearing?: No Does the patient have difficulty seeing, even when wearing glasses/contacts?: No Does the patient have difficulty concentrating, remembering, or making decisions?: No Patient able to express need for assistance with ADLs?: Yes Does the patient have difficulty dressing or bathing?: No Independently performs ADLs?: Yes (appropriate for developmental age) Does the patient have difficulty walking or climbing stairs?:  Yes Weakness of Legs: Both Weakness of Arms/Hands: Both      Admission diagnosis:  COVID 19 VIRUS INFECTION Patient Active Problem List   Diagnosis Date Noted  . COVID-19 03/27/2019  . Pneumonia due to COVID-19 virus 03/27/2019  . Abdominal pain 02/12/2019  . AKI (acute kidney injury) ([place]) 02/12/2019  . Cellulitis, abdominal wall 02/04/2019  . Elevated C-reactive protein (CRP) 12/15/2018  . Elevated sed rate 12/15/2018  . Pharmacologic therapy 11/06/2018  . Disorder of skeletal system 11/06/2018  . Problems influencing health status 11/06/2018  . Decubitus ulcer of heel, bilateral 02/21/2018  . Diabetic ulcer of toe of right foot associated with type 2 diabetes mellitus ([place]) 02/14/2018  . Ulcer of left heel and midfoot with fat layer exposed ([place]) 02/14/2018  . MRSA (methicillin resistant Staphylococcus aureus) infection 11/17/2017  . Cellulitis 11/15/2017  . Perineal abscess 11/15/2017  . Subacute vulvitis 11/01/2017  . Chronic cystitis 03/01/2017  . Chronic pain syndrome 03/31/2016  . Insomnia secondary to chronic pain 03/31/2016  . Chronic upper back pain 12/25/2015  . Chronic hand pain (Bilateral) (L>R) 12/25/2015  . Rheumatoid arthritis ([place]) 12/25/2015  . Osteoarthritis, multiple sites 12/24/2015  . Chronic foot pain (Primary Area of Pain) (Bilateral) (L>R) 12/24/2015  . Chronic elbow pain (Third area of Pain) (Bilateral) (L>R) 12/24/2015  . Chronic shoulder pain (Bilateral) (L>R) 12/24/2015  . Chronic neck pain (Bilateral) (R>L) 12/24/2015  . 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[employer]) 12/23/2015  .  Chronic knee pain (Bilateral) (R>L) 12/23/2015  . Long term current use of opiate analgesic 12/23/2015  . Long term prescription opiate use 12/23/2015  . Opiate use (30 MME/Day) 12/23/2015  . Neurogenic pain 12/23/2015  . Neuropathic pain 12/23/2015  . Diabetic peripheral neuropathy (HCC) 12/23/2015  . Encounter for therapeutic drug level monitoring  12/23/2015  . Encounter for pain management planning 12/23/2015  . GERD (gastroesophageal reflux disease) 11/25/2015  . Neuropathy 11/25/2015  . Diarrhea 10/22/2015  . Hypokalemia 10/21/2015  . Hypomagnesemia 10/21/2015  . QT prolongation 10/21/2015  . 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 Chronic wrist pain (Secondary area of Pain) (Bilateral) (L>R) 03/19/2015  . 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 Female genuine stress incontinence 02/14/2015  . Urge incontinence of urine 02/14/2015  . Obstructive apnea 07/03/2014  . Chronic diastolic CHF (congestive heart failure), NYHA class 3 (HCC) 07/03/2014  . Anxiety 01/03/2014  . Diabetic ulcer of heel (HCC) 01/03/2014  . COPD (chronic obstructive pulmonary disease) (HCC) 01/03/2014  . Bipolar disorder, unspecified (HCC) 01/03/2014  . Diastolic dysfunction 01/03/2014  . Combined fat and carbohydrate induced hyperlipemia 01/03/2014  . Shortness of breath 01/03/2014  . Acute on chronic congestive heart failure (HCC) 01/03/2014  . Incomplete bladder emptying 11/02/2012  . Bladder retention 10/10/2012  . Detrusor muscle hypertonia 10/04/2012  . Obstruction of urinary tract 10/04/2012  . FOM (frequency of micturition) 10/04/2012  . Mixed incontinence 10/04/2012  . Vitamin D insufficiency 04/15/2012  . Borderline personality disorder (HCC) 01/06/2012  . Mixed anxiety depressive disorder 01/06/2012  . History of laparoscopic adjustable gastric banding, 03/20/2007.  Removed 09/19/2011. 08/04/2011  . Nausea & vomiting 08/04/2011  . Hypothyroidism 06/28/2010  . Rheumatoid arthritis flare (HCC) 06/28/2010  . Obesity 03/29/2005  . Essential (primary) hypertension 01/27/2005  . Major depressive disorder, recurrent episode, moderate (HCC) 06/03/2004   PCP:  [facility] Pharmacy:   [facility] - [place], [place] - [address] [place] [address] Phone: [phone] Fax:  [phone]     Social Determinants of Health (SDOH) Interventions    Readmission Risk Interventions No flowsheet data found.

## 2019-03-29 NOTE — Progress Notes (Signed)
PROGRESS NOTE    [name] [name]  TDV:[id number] DOB: April 12, 1961 DOA: 03/27/2019 PCP: [facility]    Brief Narrative:  Patient is 58 year old female with history of chronic diastolic heart failure, type 2 diabetes on Tresiba, GERD, chronic arthritis, hypothyroidism, sleep apnea and obesity.  She was tested positive for COVID-19 on December 4.  Her symptoms have been consistent with generalized weakness, poor appetite, intermittent fever and chills and worsening nausea and vomiting.  She presented to [facility] emergency room with shortness of breath, was hypoxic 60% on room air, blood pressure stable temperature 39.2 C.  Patient needed more than 4 L of oxygen on presentation.  Chest x-ray with right base atelectasis and left lower lobe interstitial infiltrate.  She was ultimately transferred to [place] for treatment of COVID-19 pneumonia.   Assessment & Plan:   Principal Problem:   Pneumonia due to COVID-19 virus Active Problems:   Anxiety   COPD (chronic obstructive pulmonary disease) (HCC)   Essential (primary) hypertension   Chronic knee pain (Bilateral) (R>L)   Long term current use of opiate analgesic   Neurogenic pain   Diabetic peripheral neuropathy (HCC)   Obesity   Osteoarthritis, multiple sites  Acute hypoxemic respiratory failure due to SARS COVID-19 viral pneumonia: Continue to monitor due to significant symptoms . chest physiotherapy, incentive spirometry, deep breathing exercises, sputum induction, mucolytic's and bronchodilators. Supplemental oxygen to keep saturations more than 90%. Covid directed therapy with , steroids, on dexamethasone remdesivir, on remdesivir, day 3/5. antibiotics not indicated. Due to severity of symptoms, patient will need daily inflammatory markers, liver function test to monitor and direct COVID-19 therapies. Currently she is tolerating COVID-19 directed therapies.  Uncontrolled type 2 diabetes  with hyperglycemia: Uncontrolled type 2 diabetes to start with, hemoglobin A1c 10.7.  Blood sugars more than 500 on presentation.  Patient is also on steroid that is aggravating her hyperglycemia. Increased insulin doses today.  We will continue to uptitrate as needed.  Acute kidney injury: Stabilizing.  Continue monitoring.  Encourage oral liquids.  Currently no indication to continue IV fluids.  Chronic pain syndrome/peripheral neuropathy/anxiety and bipolar disorder: On multiple regimen at home including Abilify, BuSpar on, paroxetine, pregabalin and Seroquel.  She will continue this.  Hypertension: On lisinopril that she will continue.  Hypothyroidism: On Synthroid.  Clinically euthyroid.  Continue.  Obstructive sleep apnea: Does not use CPAP at home.  Currently CPAP on hold to reduce aerosolization procedure.  DVT prophylaxis: Heparin subcu Code Status: Full code Family Communication: None.  Patient will communicate with her family. Disposition Plan: Home after hospitalization.  Anticipate another 48-72 hours.   Consultants:   None  Procedures:   None  Antimicrobials:   Remdesivir, 03/27/2019---   Subjective: Patient seen and examined.  No overnight events.  Remains afebrile.  She is trying to stay on room air.  Gets tired on walking around.  Otherwise at rest oxygenation is adequate.  Objective: Vitals:   03/29/19 0100 03/29/19 0300 03/29/19 0434 03/29/19 0756  BP:   113/76 107/84  Pulse: 61 (!) 53 60 61  Resp:   18 18  Temp:   97.7 F (36.5 C) 97.9 F (36.6 C)  TempSrc:   Oral Oral  SpO2: 90% 94% 98% 99%  Weight:      Height:        Intake/Output Summary (Last 24 hours) at 03/29/2019 1207 Last data filed at 03/28/2019 1900 Gross per 24 hour  Intake 960 ml  Output -  Net 960 ml   Filed Weights   03/27/19 1539  Weight: 128.3 kg    Examination:  General exam: Appears calm and comfortable, mostly on room air. Respiratory system: Clear to  auscultation. Respiratory effort normal.  No added sound.  Comfortable on room air. Cardiovascular system: S1 & S2 heard, RRR. No JVD, murmurs, rubs, gallops or clicks. No pedal edema. Gastrointestinal system: Abdomen is nondistended, soft and nontender. No organomegaly or masses felt. Normal bowel sounds heard.  Obese and pendulous. Central nervous system: Alert and oriented. No focal neurological deficits. Extremities: Symmetric 5 x 5 power. Skin: No rashes, lesions or ulcers Psychiatry: Judgement and insight appear normal. Mood & affect appropriate.     Data Reviewed: I have personally reviewed following labs and imaging studies  CBC: Recent Labs  Lab 03/27/19 0002  WBC 4.9  HGB 11.9*  HCT 36.7  MCV 81.9  PLT PLATELET CLUMPS NOTED ON SMEAR, UNABLE TO ESTIMATE   Basic Metabolic Panel: Recent Labs  Lab 03/27/19 0002 03/27/19 1010 03/28/19 0120 03/29/19 0032  NA 132* 127* 134* 136  K 4.2 5.1 4.4 4.3  CL 95* 95* 99 103  CO2 26 22 25  21*  GLUCOSE 306* 533* 287* 251*  BUN 22* 30* 38* 38*  CREATININE 1.66* 1.62* 1.53* 1.05*  CALCIUM 9.0 7.7* 8.4* 8.6*   GFR: Estimated Creatinine Clearance: 81.1 mL/min (A) (by C-G formula based on SCr of 1.05 mg/dL (H)). Liver Function Tests: Recent Labs  Lab 03/27/19 0002 03/28/19 0120 03/29/19 0032  AST 58* 29 23  ALT 47* 31 28  ALKPHOS 82 59 52  BILITOT 0.5 0.7 0.4  PROT 8.2* 7.0 6.7  ALBUMIN 3.8 3.2* 2.9*   No results for input(s): LIPASE, AMYLASE in the last 168 hours. No results for input(s): AMMONIA in the last 168 hours. Coagulation Profile: No results for input(s): INR, PROTIME in the last 168 hours. Cardiac Enzymes: No results for input(s): CKTOTAL, CKMB, CKMBINDEX, TROPONINI in the last 168 hours. BNP (last 3 results) No results for input(s): PROBNP in the last 8760 hours. HbA1C: Recent Labs    03/27/19 0610  HGBA1C 10.7*   CBG: Recent Labs  Lab 03/28/19 1214 03/28/19 1557 03/28/19 2039 03/29/19 0754  03/29/19 1123  GLUCAP 328* 301* 322* 214* 222*   Lipid Profile: No results for input(s): CHOL, HDL, LDLCALC, TRIG, CHOLHDL, LDLDIRECT in the last 72 hours. Thyroid Function Tests: Recent Labs    03/27/19 0610  TSH 1.424   Anemia Panel: Recent Labs    03/28/19 0120 03/29/19 0032  FERRITIN 210 372*   Sepsis Labs: No results for input(s): PROCALCITON, LATICACIDVEN in the last 168 hours.  Recent Results (from the past 240 hour(s))  Novel Coronavirus, NAA ([facility])     Status: Abnormal   Collection Time: 03/23/19  2:14 PM   Specimen: Nasopharyngeal(NP) swabs in vial transport medium   NASOPHARYNGE  TESTING  Result Value Ref Range Status   SARS-CoV-2, NAA Detected (A) Not Detected Final    Comment: This nucleic acid amplification test was developed and its performance characteristics determined by 14/04/20. Nucleic acid amplification tests include PCR and TMA. This test has not been FDA cleared or approved. This test has been authorized by FDA under an Emergency Use Authorization (EUA). This test is only authorized for the duration of time the declaration that circumstances exist justifying the authorization of the emergency use of in vitro diagnostic tests for detection of SARS-CoV-2 virus and/or diagnosis of COVID-19 infection under section 564(b)(1)  of the Act, 21 U.S.C. 829FAO-1(H) (1), unless the authorization is terminated or revoked sooner. When diagnostic testing is negative, the possibility of a false negative result should be considered in the context of a patient's recent exposures and the presence of clinical signs and symptoms consistent with COVID-19. An individual without symptoms of COVID-19 and who is not shedding SARS-CoV-2 virus would  expect to have a negative (not detected) result in this assay.   Culture, blood (single) w Reflex to ID Panel     Status: None (Preliminary result)   Collection Time: 03/27/19  6:09 AM   Specimen: BLOOD  Result  Value Ref Range Status   Specimen Description BLOOD RIGHT [facility]  Final   Special Requests   Final    BOTTLES DRAWN AEROBIC AND ANAEROBIC Blood Culture adequate volume   Culture   Final    NO GROWTH 2 DAYS Performed at [facility], [address], [place], [place] [address]    Report Status PENDING  Incomplete         Radiology Studies: No results found.      Scheduled Meds: . ARIPiprazole  5 mg Oral Daily  . atorvastatin  80 mg Oral q1800  . busPIRone  10 mg Oral BID  . chlorpheniramine-HYDROcodone  5 mL Oral Q12H  . darifenacin  7.5 mg Oral Daily  . dexamethasone (DECADRON) injection  6 mg Intravenous Q24H  . FLUoxetine  20 mg Oral Daily  . folic acid  1 mg Oral Daily  . heparin  7,500 Units Subcutaneous Q8H  . insulin aspart  0-20 Units Subcutaneous TID WC  . insulin aspart  0-5 Units Subcutaneous QHS  . insulin aspart  10 Units Subcutaneous TID WC  . insulin detemir  40 Units Subcutaneous BID  . Ipratropium-Albuterol  1 puff Inhalation Q6H  . levothyroxine  88 mcg Oral Q0600  . lisinopril  2.5 mg Oral Daily  . polyethylene glycol  17 g Oral BID  . pramipexole  0.125 mg Oral BID  . pregabalin  150 mg Oral TID  . QUEtiapine  300 mg Oral QHS  . vitamin C  500 mg Oral Daily  . zinc sulfate  220 mg Oral Daily   Continuous Infusions: . remdesivir 100 mg in NS 100 mL 100 mg (03/29/19 1100)     LOS: 2 days    Time spent: 30 minutes    Dorcas Carrow, MD [facility] Pager [phone]

## 2019-03-30 LAB — COMPREHENSIVE METABOLIC PANEL
ALT: 25 U/L (ref 0–44)
AST: 21 U/L (ref 15–41)
Albumin: 3 g/dL — ABNORMAL LOW (ref 3.5–5.0)
Alkaline Phosphatase: 54 U/L (ref 38–126)
Anion gap: 11 (ref 5–15)
BUN: 30 mg/dL — ABNORMAL HIGH (ref 6–20)
CO2: 26 mmol/L (ref 22–32)
Calcium: 8.7 mg/dL — ABNORMAL LOW (ref 8.9–10.3)
Chloride: 103 mmol/L (ref 98–111)
Creatinine, Ser: 0.97 mg/dL (ref 0.44–1.00)
GFR calc Af Amer: >60 mL/min (ref >60)
GFR calc non Af Amer: >60 mL/min (ref >60)
Glucose, Bld: 196 mg/dL — ABNORMAL HIGH (ref 70–99)
Potassium: 3.7 mmol/L (ref 3.5–5.1)
Sodium: 140 mmol/L (ref 135–145)
Total Bilirubin: 0.3 mg/dL (ref 0.3–1.2)
Total Protein: 7 g/dL (ref 6.5–8.1)

## 2019-03-30 LAB — MAGNESIUM: Magnesium: 2.1 mg/dL (ref 1.7–2.4)

## 2019-03-30 LAB — CBC WITH DIFFERENTIAL/PLATELET
Abs Immature Granulocytes: 0 10*3/uL (ref 0.00–0.07)
Basophils Absolute: 0 10*3/uL (ref 0.0–0.1)
Basophils Relative: 0 %
Eosinophils Absolute: 0 10*3/uL (ref 0.0–0.5)
Eosinophils Relative: 0 %
HCT: 33.1 % — ABNORMAL LOW (ref 36.0–46.0)
Hemoglobin: 10.6 g/dL — ABNORMAL LOW (ref 12.0–15.0)
Immature Granulocytes: 0 %
Lymphocytes Relative: 43 %
Lymphs Abs: 1 10*3/uL (ref 0.7–4.0)
MCH: 26.7 pg (ref 26.0–34.0)
MCHC: 32 g/dL (ref 30.0–36.0)
MCV: 83.4 fL (ref 80.0–100.0)
Monocytes Absolute: 0 10*3/uL — ABNORMAL LOW (ref 0.1–1.0)
Monocytes Relative: 1 %
Neutro Abs: 1.3 10*3/uL — ABNORMAL LOW (ref 1.7–7.7)
Neutrophils Relative %: 56 %
Platelets: 134 10*3/uL — ABNORMAL LOW (ref 150–400)
RBC: 3.97 MIL/uL (ref 3.87–5.11)
RDW: 14.4 % (ref 11.5–15.5)
WBC: 2.3 10*3/uL — ABNORMAL LOW (ref 4.0–10.5)
nRBC: 0 % (ref 0.0–0.2)

## 2019-03-30 LAB — PHOSPHORUS: Phosphorus: 2.7 mg/dL (ref 2.5–4.6)

## 2019-03-30 LAB — FERRITIN: Ferritin: 266 ng/mL (ref 11–307)

## 2019-03-30 LAB — GLUCOSE, CAPILLARY
Glucose-Capillary: 152 mg/dL — ABNORMAL HIGH (ref 70–99)
Glucose-Capillary: 233 mg/dL — ABNORMAL HIGH (ref 70–99)
Glucose-Capillary: 295 mg/dL — ABNORMAL HIGH (ref 70–99)
Glucose-Capillary: 364 mg/dL — ABNORMAL HIGH (ref 70–99)

## 2019-03-30 LAB — D-DIMER, QUANTITATIVE: D-Dimer, Quant: 0.65 ug/mL-FEU — ABNORMAL HIGH (ref 0.00–0.50)

## 2019-03-30 LAB — C-REACTIVE PROTEIN: CRP: 1.6 mg/dL — ABNORMAL HIGH (ref <1.0)

## 2019-03-30 NOTE — Progress Notes (Signed)
OT Cancellation Note  Patient Details Name: [name] [name] MRN: [id number] DOB: [birth year]   Cancelled Treatment:    Reason Eval/Treat Not Completed: OT screened, no needs identified, will sign off; spoke with evaluating PT as well as pt, pt mobilizing and performing ADL without difficulty at independent level. Pt up ad lib in bathroom upon arrival to room, reports feeling at her baseline regarding ADL and mobility completion. Acute OT to sign off, thank you for this referral, please re-consult should pt's needs change.  Lou Cal, OT [facility] Pager [phone] Office [phone]   Raymondo Band 03/30/2019, 4:41 PM

## 2019-03-30 NOTE — Evaluation (Signed)
Physical Therapy Evaluation Patient Details Name: [name] [name] MRN: [id number] DOB: 01/23/1961 Today's Date: 03/30/2019   History of Present Illness  58 year old female with history of chronic diastolic heart failure, type 2 diabetes on Tresiba, GERD, chronic arthritis, hypothyroidism, sleep apnea and obesity.  She was tested positive for COVID-19 on December 4  Clinical Impression   Pt admitted with above diagnosis. PTA was living home with family and states was very independent and able to complete all ADLs and IADLs on her own. Pt currently with functional limitations due to the deficits listed below (see PT Problem List). Pt presents with decreased balance and coordination and decreased independence with ambulation. Pt will benefit from skilled PT while in hospital to increase their independence and safety with mobility to allow discharge to the venue listed below.       Follow Up Recommendations No PT follow up    Equipment Recommendations  None recommended by PT    Recommendations for Other Services       Precautions / Restrictions Precautions Precautions: None Restrictions Weight Bearing Restrictions: No      Mobility  Bed Mobility Overal bed mobility: Modified Independent             General bed mobility comments: uses bed fixtures but states that bed at home acts like hospital bed  Transfers Overall transfer level: Modified independent Equipment used: None                Ambulation/Gait Ambulation/Gait assistance: Supervision Gait Distance (Feet): 300 Feet Assistive device: None Gait Pattern/deviations: Step-through pattern     General Gait Details: ambulated approx 393ft with SBA-mod I and room air sats at end 95%  Stairs            Wheelchair Mobility    Modified Rankin (Stroke Patients Only)       Balance                                 Standardized Balance Assessment Standardized Balance Assessment : Berg  Balance Test Berg Balance Test Sit to Stand: Able to stand  independently using hands Standing Unsupported: Able to stand safely 2 minutes Sitting with Back Unsupported but Feet Supported on Floor or Stool: Able to sit safely and securely 2 minutes Stand to Sit: Sits safely with minimal use of hands Transfers: Able to transfer safely, minor use of hands Standing Unsupported with Eyes Closed: Able to stand 3 seconds Standing Ubsupported with Feet Together: Needs help to attain position but able to stand for 30 seconds with feet together From Standing, Reach Forward with Outstretched Arm: Reaches forward but needs supervision From Standing Position, Pick up Object from Floor: Unable to pick up and needs supervision From Standing Position, Turn to Look Behind Over each Shoulder: Needs supervision when turning Turn 360 Degrees: Needs close supervision or verbal cueing Standing Unsupported, Alternately Place Feet on Step/Stool: Able to complete >2 steps/needs minimal assist Standing Unsupported, One Foot in Front: Loses balance while stepping or standing Standing on One Leg: Unable to try or needs assist to prevent fall Total Score: 27         Pertinent Vitals/Pain Pain Assessment: No/denies pain(usually has RA usualliy in jpints)    Home Living Family/patient expects to be discharged to:: Private residence Living Arrangements: Children Available Help at Discharge: Family Type of Home: House Home Access: Ramped entrance     Home Layout: One level  Home Equipment: Walker - 2 wheels;Cane - single point;Shower seat - built in;Bedside commode;Wheelchair - power;Other (comment)(sleep number bd acts like hospital bed)      Prior Function Level of Independence: Independent               Hand Dominance   Dominant Hand: Right    Extremity/Trunk Assessment   Upper Extremity Assessment Upper Extremity Assessment: Overall WFL for tasks assessed    Lower Extremity  Assessment Lower Extremity Assessment: Overall WFL for tasks assessed       Communication   Communication: No difficulties  Cognition Arousal/Alertness: Awake/alert Behavior During Therapy: WFL for tasks assessed/performed Overall Cognitive Status: Within Functional Limits for tasks assessed                                        General Comments      Exercises     Assessment/Plan    PT Assessment Patient needs continued PT services  PT Problem List Decreased strength;Decreased activity tolerance       PT Treatment Interventions Gait training;Functional mobility training;Therapeutic activities;Therapeutic exercise;Balance training;Neuromuscular re-education;Patient/family education    PT Goals (Current goals can be found in the Care Plan section)  Acute Rehab PT Goals Patient Stated Goal: go home and be independent PT Goal Formulation: With patient Time For Goal Achievement: 04/13/19 Potential to Achieve Goals: Good    Frequency Min 3X/week   Barriers to discharge        Co-evaluation               AM-PAC PT "6 Clicks" Mobility  Outcome Measure Help needed turning from your back to your side while in a flat bed without using bedrails?: None Help needed moving from lying on your back to sitting on the side of a flat bed without using bedrails?: None Help needed moving to and from a bed to a chair (including a wheelchair)?: None Help needed standing up from a chair using your arms (e.g., wheelchair or bedside chair)?: None Help needed to walk in hospital room?: A Little Help needed climbing 3-5 steps with a railing? : A Little 6 Click Score: 22    End of Session   Activity Tolerance: Patient tolerated treatment well Patient left: in bed;with call bell/phone within reach Nurse Communication: Mobility status PT Visit Diagnosis: Other abnormalities of gait and mobility (R26.89);Unsteadiness on feet (R26.81)    Time: 5102-5852 PT Time  Calculation (min) (ACUTE ONLY): 19 min   Charges:   PT Evaluation $PT Eval Low Complexity: 1 Low          [name] [name], PT   [name] [name] 03/30/2019, 12:14 PM

## 2019-03-30 NOTE — Progress Notes (Signed)
PROGRESS NOTE    [name] [name]  VQM:[id number] DOB: Sep 26, 1960 DOA: 03/27/2019 PCP: [facility]    Brief Narrative:  Patient is 58 year old female with history of chronic diastolic heart failure, type 2 diabetes on Tresiba, GERD, chronic arthritis, hypothyroidism, sleep apnea and obesity.  She was tested positive for COVID-19 on December 4.  Her symptoms have been consistent with generalized weakness, poor appetite, intermittent fever and chills and worsening nausea and vomiting.  She presented to [facility] emergency room with shortness of breath, was hypoxic 60% on room air, blood pressure stable temperature 39.2 C.  Patient needed more than 4 L of oxygen on presentation.  Chest x-ray with right base atelectasis and left lower lobe interstitial infiltrate.  She was ultimately transferred to [place] for treatment of COVID-19 pneumonia.   Assessment & Plan:   Principal Problem:   Pneumonia due to COVID-19 virus Active Problems:   Anxiety   COPD (chronic obstructive pulmonary disease) (HCC)   Essential (primary) hypertension   Chronic knee pain (Bilateral) (R>L)   Long term current use of opiate analgesic   Neurogenic pain   Diabetic peripheral neuropathy (HCC)   Obesity   Osteoarthritis, multiple sites  Acute hypoxemic respiratory failure due to SARS COVID-19 viral pneumonia: Continue to monitor due to significant symptoms .  Symptomatically improving.  Oxygen requirement improving. chest physiotherapy, incentive spirometry, deep breathing exercises, sputum induction, mucolytic's and bronchodilators. Supplemental oxygen to keep saturations more than 90%. Covid directed therapy with , steroids, on dexamethasone remdesivir, on remdesivir, day 4/5. antibiotics not indicated. Due to severity of symptoms, patient will need daily inflammatory markers, liver function test to monitor and direct COVID-19 therapies. Currently she is tolerating  COVID-19 directed therapies.  Uncontrolled type 2 diabetes with hyperglycemia: Uncontrolled type 2 diabetes to start with, hemoglobin A1c 10.7.  Blood sugars more than 500 on presentation.  Patient is also on steroid that is aggravating her hyperglycemia. On increased insulin doses with fairly controlled blood sugars today.    Acute kidney injury: Stabilizing.  Continue monitoring.  Encourage oral liquids.  Currently no indication to continue IV fluids.  Chronic pain syndrome/peripheral neuropathy/anxiety and bipolar disorder: On multiple regimen at home including Abilify, BuSpar on, paroxetine, pregabalin and Seroquel.  She will continue this.  Hypertension: On lisinopril that she will continue.  Hypothyroidism: On Synthroid.  Clinically euthyroid.  Continue.  Obstructive sleep apnea: Does not use CPAP at home.  Currently CPAP on hold to reduce aerosolization procedure.  DVT prophylaxis: Heparin subcu Code Status: Full code Family Communication: None.  Patient will communicate with her family. Disposition Plan: Home after hospitalization.  Anticipate discharge home tomorrow if stable.   Consultants:   None  Procedures:   None  Antimicrobials:   Remdesivir, 03/27/2019---   Subjective: Patient seen and examined.  No overnight events.  Inquiring about going home.  Afebrile.  Mostly on room air at rest.  Objective: Vitals:   03/29/19 1805 03/29/19 2034 03/30/19 0404 03/30/19 0800  BP: 113/62 125/68 137/81 136/89  Pulse: 67 71 63 63  Resp: 16 18 17 18   Temp: 98.2 F (36.8 C) 98.5 F (36.9 C) 98.2 F (36.8 C) 97.9 F (36.6 C)  TempSrc: Oral Oral Oral Oral  SpO2: 90% 95% 96% 97%  Weight:      Height:       No intake or output data in the 24 hours ending 03/30/19 1226 Filed Weights   03/27/19 1539  Weight: 128.3 kg  Examination:  General exam: Appears calm and comfortable, mostly on room air. Respiratory system: Clear to auscultation. Respiratory effort  normal.  No added sound.  Comfortable on room air. Cardiovascular system: S1 & S2 heard, RRR. No JVD, murmurs, rubs, gallops or clicks. No pedal edema. Gastrointestinal system: Abdomen is nondistended, soft and nontender. No organomegaly or masses felt. Normal bowel sounds heard.  Obese and pendulous. Central nervous system: Alert and oriented. No focal neurological deficits. Extremities: Symmetric 5 x 5 power. Skin: No rashes, lesions or ulcers Psychiatry: Judgement and insight appear normal. Mood & affect appropriate.     Data Reviewed: I have personally reviewed following labs and imaging studies  CBC: Recent Labs  Lab 03/27/19 0002 03/30/19 0515  WBC 4.9 2.3*  NEUTROABS  --  1.3*  HGB 11.9* 10.6*  HCT 36.7 33.1*  MCV 81.9 83.4  PLT PLATELET CLUMPS NOTED ON SMEAR, UNABLE TO ESTIMATE 134*   Basic Metabolic Panel: Recent Labs  Lab 03/27/19 0002 03/27/19 1010 03/28/19 0120 03/29/19 0032 03/30/19 0515  NA 132* 127* 134* 136 140  K 4.2 5.1 4.4 4.3 3.7  CL 95* 95* 99 103 103  CO2 26 22 25  21* 26  GLUCOSE 306* 533* 287* 251* 196*  BUN 22* 30* 38* 38* 30*  CREATININE 1.66* 1.62* 1.53* 1.05* 0.97  CALCIUM 9.0 7.7* 8.4* 8.6* 8.7*  MG  --   --   --   --  2.1  PHOS  --   --   --   --  2.7   GFR: Estimated Creatinine Clearance: 87.8 mL/min (by C-G formula based on SCr of 0.97 mg/dL). Liver Function Tests: Recent Labs  Lab 03/27/19 0002 03/28/19 0120 03/29/19 0032 03/30/19 0515  AST 58* 29 23 21   ALT 47* 31 28 25   ALKPHOS 82 59 52 54  BILITOT 0.5 0.7 0.4 0.3  PROT 8.2* 7.0 6.7 7.0  ALBUMIN 3.8 3.2* 2.9* 3.0*   No results for input(s): LIPASE, AMYLASE in the last 168 hours. No results for input(s): AMMONIA in the last 168 hours. Coagulation Profile: No results for input(s): INR, PROTIME in the last 168 hours. Cardiac Enzymes: No results for input(s): CKTOTAL, CKMB, CKMBINDEX, TROPONINI in the last 168 hours. BNP (last 3 results) No results for input(s): PROBNP in  the last 8760 hours. HbA1C: No results for input(s): HGBA1C in the last 72 hours. CBG: Recent Labs  Lab 03/29/19 1123 03/29/19 1630 03/29/19 2036 03/30/19 0812 03/30/19 1200  GLUCAP 222* 323* 293* 152* 233*   Lipid Profile: No results for input(s): CHOL, HDL, LDLCALC, TRIG, CHOLHDL, LDLDIRECT in the last 72 hours. Thyroid Function Tests: No results for input(s): TSH, T4TOTAL, FREET4, T3FREE, THYROIDAB in the last 72 hours. Anemia Panel: Recent Labs    03/29/19 0032 03/30/19 0515  FERRITIN 372* 266   Sepsis Labs: No results for input(s): PROCALCITON, LATICACIDVEN in the last 168 hours.  Recent Results (from the past 240 hour(s))  Novel Coronavirus, NAA ([facility])     Status: Abnormal   Collection Time: 03/23/19  2:14 PM   Specimen: Nasopharyngeal(NP) swabs in vial transport medium   NASOPHARYNGE  TESTING  Result Value Ref Range Status   SARS-CoV-2, NAA Detected (A) Not Detected Final    Comment: This nucleic acid amplification test was developed and its performance characteristics determined by 14/10/20. Nucleic acid amplification tests include PCR and TMA. This test has not been FDA cleared or approved. This test has been authorized by FDA under an Emergency Use Authorization (  EUA). This test is only authorized for the duration of time the declaration that circumstances exist justifying the authorization of the emergency use of in vitro diagnostic tests for detection of SARS-CoV-2 virus and/or diagnosis of COVID-19 infection under section 564(b)(1) of the Act, 21 U.S.C. 829FAO-1(H360bbb-3(b) (1), unless the authorization is terminated or revoked sooner. When diagnostic testing is negative, the possibility of a false negative result should be considered in the context of a patient's recent exposures and the presence of clinical signs and symptoms consistent with COVID-19. An individual without symptoms of COVID-19 and who is not shedding SARS-CoV-2 virus would  expect  to have a negative (not detected) result in this assay.   Culture, blood (single) w Reflex to ID Panel     Status: None (Preliminary result)   Collection Time: 03/27/19  6:09 AM   Specimen: BLOOD  Result Value Ref Range Status   Spec[facility]  Final   Special Requests   Final    BOTTLES DRAWN AEROBIC AND ANAEROBIC Blood Culture adequate volume   Culture   Final    NO GROWTH 3 DAYS Performed at [facility], [address] [place], [place] [address]    Report Status PENDING  Incomplete         Radiology Studies: No results found.      Scheduled Meds: . ARIPiprazole  5 mg Oral Daily  . atorvastatin  80 mg Oral q1800  . busPIRone  10 mg Oral BID  . chlorpheniramine-HYDROcodone  5 mL Oral Q12H  . darifenacin  7.5 mg Oral Daily  . dexamethasone (DECADRON) injection  6 mg Intravenous Q24H  . FLUoxetine  20 mg Oral Daily  . folic acid  1 mg Oral Daily  . heparin  7,500 Units Subcutaneous Q8H  . insulin aspart  0-20 Units Subcutaneous TID WC  . insulin aspart  0-5 Units Subcutaneous QHS  . insulin aspart  10 Units Subcutaneous TID WC  . insulin detemir  40 Units Subcutaneous BID  . Ipratropium-Albuterol  1 puff Inhalation Q6H  . levothyroxine  88 mcg Oral Q0600  . lisinopril  2.5 mg Oral Daily  . polyethylene glycol  17 g Oral BID  . pramipexole  0.125 mg Oral BID  . pregabalin  150 mg Oral TID  . QUEtiapine  300 mg Oral QHS  . vitamin C  500 mg Oral Daily  . zinc sulfate  220 mg Oral Daily   Continuous Infusions: . remdesivir 100 mg in NS 100 mL 100 mg (03/30/19 1122)     LOS: 3 days    Time spent: 30 minutes    Dorcas CarrowKuber Mikayla Chiusano, MD [facility] Pager [phone]

## 2019-03-31 LAB — CBC WITH DIFFERENTIAL/PLATELET
Abs Immature Granulocytes: 0.01 10*3/uL (ref 0.00–0.07)
Basophils Absolute: 0 10*3/uL (ref 0.0–0.1)
Basophils Relative: 0 %
Eosinophils Absolute: 0 10*3/uL (ref 0.0–0.5)
Eosinophils Relative: 0 %
HCT: 34.7 % — ABNORMAL LOW (ref 36.0–46.0)
Hemoglobin: 11 g/dL — ABNORMAL LOW (ref 12.0–15.0)
Immature Granulocytes: 0 %
Lymphocytes Relative: 46 %
Lymphs Abs: 1.4 10*3/uL (ref 0.7–4.0)
MCH: 26.7 pg (ref 26.0–34.0)
MCHC: 31.7 g/dL (ref 30.0–36.0)
MCV: 84.2 fL (ref 80.0–100.0)
Monocytes Absolute: 0.1 10*3/uL (ref 0.1–1.0)
Monocytes Relative: 3 %
Neutro Abs: 1.5 10*3/uL — ABNORMAL LOW (ref 1.7–7.7)
Neutrophils Relative %: 51 %
Platelets: 157 10*3/uL (ref 150–400)
RBC: 4.12 MIL/uL (ref 3.87–5.11)
RDW: 14.2 % (ref 11.5–15.5)
WBC: 3 10*3/uL — ABNORMAL LOW (ref 4.0–10.5)
nRBC: 0 % (ref 0.0–0.2)

## 2019-03-31 LAB — C-REACTIVE PROTEIN: CRP: 1.1 mg/dL — ABNORMAL HIGH (ref <1.0)

## 2019-03-31 LAB — GLUCOSE, CAPILLARY
Glucose-Capillary: 120 mg/dL — ABNORMAL HIGH (ref 70–99)
Glucose-Capillary: 168 mg/dL — ABNORMAL HIGH (ref 70–99)

## 2019-03-31 LAB — COMPREHENSIVE METABOLIC PANEL
ALT: 24 U/L (ref 0–44)
AST: 20 U/L (ref 15–41)
Albumin: 3.2 g/dL — ABNORMAL LOW (ref 3.5–5.0)
Alkaline Phosphatase: 52 U/L (ref 38–126)
Anion gap: 11 (ref 5–15)
BUN: 26 mg/dL — ABNORMAL HIGH (ref 6–20)
CO2: 26 mmol/L (ref 22–32)
Calcium: 8.9 mg/dL (ref 8.9–10.3)
Chloride: 105 mmol/L (ref 98–111)
Creatinine, Ser: 0.91 mg/dL (ref 0.44–1.00)
GFR calc Af Amer: >60 mL/min (ref >60)
GFR calc non Af Amer: >60 mL/min (ref >60)
Glucose, Bld: 168 mg/dL — ABNORMAL HIGH (ref 70–99)
Potassium: 3.5 mmol/L (ref 3.5–5.1)
Sodium: 142 mmol/L (ref 135–145)
Total Bilirubin: 0.5 mg/dL (ref 0.3–1.2)
Total Protein: 7 g/dL (ref 6.5–8.1)

## 2019-03-31 LAB — D-DIMER, QUANTITATIVE: D-Dimer, Quant: 0.62 ug/mL-FEU — ABNORMAL HIGH (ref 0.00–0.50)

## 2019-03-31 LAB — FERRITIN: Ferritin: 187 ng/mL (ref 11–307)

## 2019-03-31 MED ORDER — DEXAMETHASONE 6 MG PO TABS: 6.0000 mg | ORAL_TABLET | Freq: Every day | ORAL | 0 refills | Status: AC

## 2019-03-31 NOTE — Discharge Summary (Signed)
Physician Discharge Summary  [name] [name] YYQ:[id number] DOB: 1960-07-02 DOA: 03/27/2019  PCP: [facility]  Admit date: 03/27/2019 Discharge date: 03/31/2019  Admitted From: Home Disposition: Home  Recommendations for Outpatient Follow-up:  1. Follow up with PCP in 1-2 weeks 2. Closely follow-up your blood sugars.  Home Health: Not applicable Equipment/Devices: Not applicable  Discharge Condition: Stable CODE STATUS: Full code Diet recommendation: Low-carb diet  Discharge summary: Patient is 58 year old female with history of chronic diastolic heart failure, type 2 diabetes on Tresiba, GERD, chronic arthritis, hypothyroidism, sleep apnea and obesity.  She was tested positive for COVID-19 on December 4.  Her symptoms have been consistent with generalized weakness, poor appetite, intermittent fever and chills and worsening nausea and vomiting.  She presented to [facility] emergency room with shortness of breath, was hypoxic 60% on room air, blood pressure stable temperature 39.2 C.  Patient needed more than 4 L of oxygen on presentation.  Chest x-ray with right base atelectasis and left lower lobe interstitial infiltrate.  She was ultimately transferred to [facility] for treatment of COVID-19 pneumonia.  Acute hypoxic respiratory failure due to COVID-19 viral pneumonia: Did very good clinical recovery.  Improved and stabilized currently on room air on ambulation. Remdesivir for 5 days.  Finished. Dexamethasone 5 days, 5 more days on discharge. Isolation precautions and monitoring at home.  Uncontrolled type 2 diabetes with hyperglycemia: Anticipate worsening blood sugars.  She will use increasing dose of home insulin.  Chronic pain syndrome on multiple medications: Advised to carefully take her medications.  Managed by outpatient provider.  Adequately improved.  Able to go home today after finishing 5 days of remdesivir therapy. Discharge  Diagnoses:  Principal Problem:   Pneumonia due to COVID-19 virus Active Problems:   Anxiety   COPD (chronic obstructive pulmonary disease) (HCC)   Essential (primary) hypertension   Chronic knee pain (Bilateral) (R>L)   Long term current use of opiate analgesic   Neurogenic pain   Diabetic peripheral neuropathy (HCC)   Obesity   Osteoarthritis, multiple sites    Discharge Instructions  Discharge Instructions    Call MD for:  difficulty breathing, headache or visual disturbances   Complete by: As directed    Call MD for:  temperature >100.4   Complete by: As directed    Diet Carb Modified   Complete by: As directed    Discharge instructions   Complete by: As directed    Monitor your blood sugars at home and may need more insulin. We can use over-the-counter cough medications and Tylenol.   Increase activity slowly   Complete by: As directed    MyChart COVID-19 home monitoring program   Complete by: Mar 31, 2019    Is the patient willing to use the MyChart Mobile App for home monitoring?: Yes   Temperature monitoring   Complete by: Mar 31, 2019    After how many days would you like to receive a notification of this patient's flowsheet entries?: 1     Allergies as of 03/31/2019      Reactions   Cephalexin Hives   Codeine Palpitations, Nausea Only, Nausea And Vomiting, Rash, Shortness Of Breath   "makes heart fly, she gets flushed and passes out"   Doxycycline Rash   Propoxyphene Rash, Shortness Of Breath   Increase heart rate   Sulfa Antibiotics Palpitations, Nausea Only, Shortness Of Breath, Hives   "makes heart fly, she gets flushed and passes out"   Lovenox [employer]  Sodium] Hives   Hydrocodone Nausea And Vomiting   Hear racing & breaks out into a cold sweat.   Meropenem Rash   Erythematous, hot, pruritic rash over arms, chest, back, abdomen, and face occurred at the end of meropenem infusion on 02/22/18      Medication List    TAKE these medications    ARIPiprazole 5 MG tablet Commonly known as: ABILIFY TAKE 1 TABLET BY MOUTH ONCE DAILY.   atorvastatin 80 MG tablet Commonly known as: LIPITOR Take 80 mg by mouth daily at 6 PM.   busPIRone 10 MG tablet Commonly known as: BUSPAR TAKE 1 TABLET BY MOUTH TWICE DAILY   Calcium Carb-Ergocalciferol 250-125 MG-UNIT Tabs Take 1 tablet by mouth daily.   dexamethasone 6 MG tablet Commonly known as: DECADRON Take 1 tablet (6 mg total) by mouth daily for 5 days.   FLUoxetine 20 MG capsule Commonly known as: PROZAC TAKE (1) CAPSULE BY MOUTH ONCE DAILY.   folic acid 1 MG tablet Commonly known as: FOLVITE Take 1 mg by mouth daily.   levothyroxine 88 MCG tablet Commonly known as: SYNTHROID Take 88 mcg by mouth daily before breakfast.   lisinopril 2.5 MG tablet Commonly known as: ZESTRIL Take 2.5 mg by mouth daily.   metFORMIN 500 MG tablet Commonly known as: GLUCOPHAGE Take 500 mg by mouth 2 (two) times daily with a meal.   methotrexate 50 MG/2ML injection Inject 50 mg/m2 into the muscle once a week. Friday   oxyCODONE 5 MG immediate release tablet Commonly known as: Oxy IR/ROXICODONE Take 1 tablet (5 mg total) by mouth every 6 (six) hours as needed for severe pain. Must last 30 days   oxyCODONE 5 MG immediate release tablet Commonly known as: Oxy IR/ROXICODONE Take 1 tablet (5 mg total) by mouth every 6 (six) hours as needed for severe pain. Must last 30 days Start taking on: April 15, 2019   pramipexole 0.125 MG tablet Commonly known as: MIRAPEX Take 0.125 mg by mouth 2 (two) times daily.   pregabalin 150 MG capsule Commonly known as: LYRICA Take 1 capsule (150 mg total) by mouth 3 (three) times daily.   QUEtiapine 300 MG tablet Commonly known as: SEROQUEL TAKE ONE TABLET BY MOUTH AT BEDTIME.   Evaristo Buryresiba FlexTouch 200 UNIT/ML Sopn Generic drug: Insulin Degludec Inject 65-100 Units into the skin daily.   VESIcare 5 MG tablet Generic drug: solifenacin Take 5 mg  by mouth daily.   Vitamin D3 125 MCG (5000 UT) Caps Take 1 capsule (5,000 Units total) by mouth daily with breakfast. Take along with calcium and magnesium.   zolpidem 5 MG tablet Commonly known as: AMBIEN TAKE 1 TABLET BY MOUTH EVERYDAY AT BEDTIME       Allergies  Allergen Reactions  . Cephalexin Hives  . Codeine Palpitations, Nausea Only, Nausea And Vomiting, Rash and Shortness Of Breath    "makes heart fly, she gets flushed and passes out"  . Doxycycline Rash  . Propoxyphene Rash and Shortness Of Breath    Increase heart rate  . Sulfa Antibiotics Palpitations, Nausea Only, Shortness Of Breath and Hives    "makes heart fly, she gets flushed and passes out"  . Lovenox [Enoxaparin Sodium] Hives  . Hydrocodone Nausea And Vomiting    Hear racing & breaks out into a cold sweat.  . Meropenem Rash    Erythematous, hot, pruritic rash over arms, chest, back, abdomen, and face occurred at the end of meropenem infusion on 02/22/18    Consultations:  None   Procedures/Studies: DG Chest Port 1 View  Result Date: 03/26/2019 CLINICAL DATA:  Nonproductive cough. Patient reports COVID-19 positive. EXAM: PORTABLE CHEST 1 VIEW COMPARISON:  Radiograph 05/27/2014, CT 07/07/2012 FINDINGS: Patient is rotated. Upper normal heart size with unchanged mediastinal contours allowing for rotation. Pericardial opacity corresponds to a prominent pericardial fat pad on CT. Minimal streaky bibasilar opacities. No pulmonary edema, pleural effusion or pneumothorax. No acute osseous abnormalities are seen. IMPRESSION: Minimal streaky bibasilar opacities. Findings favor atelectasis, however may represent atypical viral pneumonia in the setting of COVID-19. Electronically Signed   By: Narda Rutherford M.D.   On: 03/26/2019 23:39    Subjective: Patient seen and examined.  No overnight events.  Eager to go home.  Ambulated in the hallway with some shortness of breath but no drop in oxygen saturation.  She feels back  to herself.   Discharge Exam: Vitals:   03/31/19 0400 03/31/19 0919  BP: 94/64 123/66  Pulse: 64 71  Resp:  18  Temp: 98 F (36.7 C) 97.8 F (36.6 C)  SpO2: 94% 95%   Vitals:   03/30/19 1500 03/30/19 1948 03/31/19 0400 03/31/19 0919  BP: 120/84 112/69 94/64 123/66  Pulse: 69 66 64 71  Resp: 16 18  18   Temp: 98.6 F (37 C) 98.5 F (36.9 C) 98 F (36.7 C) 97.8 F (36.6 C)  TempSrc: Oral Oral Oral Oral  SpO2: 97% 100% 94% 95%  Weight:      Height:        General: Pt is alert, awake, not in acute distress, on room air. Cardiovascular: RRR, S1/S2 +, no rubs, no gallops Respiratory: CTA bilaterally, no wheezing, no rhonchi Abdominal: Soft, NT, ND, bowel sounds + Extremities: no edema, no cyanosis    The results of significant diagnostics from this hospitalization (including imaging, microbiology, ancillary and laboratory) are listed below for reference.     Microbiology: Recent Results (from the past 240 hour(s))  Novel Coronavirus, NAA ([facility])     Status: Abnormal   Collection Time: 03/23/19  2:14 PM   Specimen: Nasopharyngeal(NP) swabs in vial transport medium   NASOPHARYNGE  TESTING  Result Value Ref Range Status   SARS-CoV-2, NAA Detected (A) Not Detected Final    Comment: This nucleic acid amplification test was developed and its performance characteristics determined by [employer]. Nucleic acid amplification tests include PCR and TMA. This test has not been FDA cleared or approved. This test has been authorized by FDA under an Emergency Use Authorization (EUA). This test is only authorized for the duration of time the declaration that circumstances exist justifying the authorization of the emergency use of in vitro diagnostic tests for detection of SARS-CoV-2 virus and/or diagnosis of COVID-19 infection under section 564(b)(1) of the Act, 21 U.S.C. 093OIZ-1(I) (1), unless the authorization is terminated or revoked sooner. When diagnostic testing  is negative, the possibility of a false negative result should be considered in the context of a patient's recent exposures and the presence of clinical signs and symptoms consistent with COVID-19. An individual without symptoms of COVID-19 and who is not shedding SARS-CoV-2 virus would  expect to have a negative (not detected) result in this assay.   Culture, blood (single) w Reflex to ID Panel     Status: None (Preliminary result)   Collection Time: 03/27/19  6:09 AM   Specimen: BLOOD  Result Value Ref Range Status   Specimen Description BLOOD RIGHT [facility]  Final   Special Requests   Final  BOTTLES DRAWN AEROBIC AND ANAEROBIC Blood Culture adequate volume   Culture   Final    NO GROWTH 4 DAYS Performed at [facility], [address] [place], [place] [address]    Report Status PENDING  Incomplete     Labs: BNP (last 3 results) No results for input(s): BNP in the last 8760 hours. Basic Metabolic Panel: Recent Labs  Lab 03/27/19 1010 03/28/19 0120 03/29/19 0032 03/30/19 0515 03/31/19 0327  NA 127* 134* 136 140 142  K 5.1 4.4 4.3 3.7 3.5  CL 95* 99 103 103 105  CO2 22 25 21* 26 26  GLUCOSE 533* 287* 251* 196* 168*  BUN 30* 38* 38* 30* 26*  CREATININE 1.62* 1.53* 1.05* 0.97 0.91  CALCIUM 7.7* 8.4* 8.6* 8.7* 8.9  MG  --   --   --  2.1  --   PHOS  --   --   --  2.7  --    Liver Function Tests: Recent Labs  Lab 03/27/19 0002 03/28/19 0120 03/29/19 0032 03/30/19 0515 03/31/19 0327  AST 58* 29 23 21 20   ALT 47* 31 28 25 24   ALKPHOS 82 59 52 54 52  BILITOT 0.5 0.7 0.4 0.3 0.5  PROT 8.2* 7.0 6.7 7.0 7.0  ALBUMIN 3.8 3.2* 2.9* 3.0* 3.2*   No results for input(s): LIPASE, AMYLASE in the last 168 hours. No results for input(s): AMMONIA in the last 168 hours. CBC: Recent Labs  Lab 03/27/19 0002 03/30/19 0515 03/31/19 0327  WBC 4.9 2.3* 3.0*  NEUTROABS  --  1.3* 1.5*  HGB 11.9* 10.6* 11.0*  HCT 36.7 33.1* 34.7*  MCV 81.9 83.4 84.2  PLT PLATELET CLUMPS  NOTED ON SMEAR, UNABLE TO ESTIMATE 134* 157   Cardiac Enzymes: No results for input(s): CKTOTAL, CKMB, CKMBINDEX, TROPONINI in the last 168 hours. BNP: Invalid input(s): POCBNP CBG: Recent Labs  Lab 03/30/19 0812 03/30/19 1200 03/30/19 1553 03/30/19 1952 03/31/19 0751  GLUCAP 152* 233* 295* 364* 120*   D-Dimer Recent Labs    03/30/19 0515 03/31/19 0327  DDIMER 0.65* 0.62*   Hgb A1c No results for input(s): HGBA1C in the last 72 hours. Lipid Profile No results for input(s): CHOL, HDL, LDLCALC, TRIG, CHOLHDL, LDLDIRECT in the last 72 hours. Thyroid function studies No results for input(s): TSH, T4TOTAL, T3FREE, THYROIDAB in the last 72 hours.  Invalid input(s): FREET3 Anemia work up Recent Labs    03/30/19 0515 03/31/19 0327  FERRITIN 266 187   Urinalysis    Component Value Date/Time   COLORURINE YELLOW (A) 03/27/2019 0002   APPEARANCEUR HAZY (A) 03/27/2019 0002   APPEARANCEUR Clear 11/30/2013 1010   LABSPEC 1.016 03/27/2019 0002   LABSPEC 1.015 11/30/2013 1010   PHURINE 5.0 03/27/2019 0002   GLUCOSEU >=500 (A) 03/27/2019 0002   GLUCOSEU >=500 11/30/2013 1010   HGBUR NEGATIVE 03/27/2019 0002   BILIRUBINUR NEGATIVE 03/27/2019 0002   BILIRUBINUR Negative 11/30/2013 1010   KETONESUR NEGATIVE 03/27/2019 0002   PROTEINUR 30 (A) 03/27/2019 0002   UROBILINOGEN 0.2 03/14/2007 1000   NITRITE NEGATIVE 03/27/2019 0002   LEUKOCYTESUR NEGATIVE 03/27/2019 0002   LEUKOCYTESUR Negative 11/30/2013 1010   Sepsis Labs Invalid input(s): PROCALCITONIN,  WBC,  LACTICIDVEN Microbiology Recent Results (from the past 240 hour(s))  Novel Coronavirus, NAA ([facility])     Status: Abnormal   Collection Time: 03/23/19  2:14 PM   Specimen: Nasopharyngeal(NP) swabs in vial transport medium   NASOPHARYNGE  TESTING  Result Value Ref Range Status  SARS-CoV-2, NAA Detected (A) Not Detected Final    Comment: This nucleic acid amplification test was developed and its  performance characteristics determined by [facility]. Nucleic acid amplification tests include PCR and TMA. This test has not been FDA cleared or approved. This test has been authorized by FDA under an Emergency Use Authorization (EUA). This test is only authorized for the duration of time the declaration that circumstances exist justifying the authorization of the emergency use of in vitro diagnostic tests for detection of SARS-CoV-2 virus and/or diagnosis of COVID-19 infection under section 564(b)(1) of the Act, 21 U.S.C. 454UJW-1(X360bbb-3(b) (1), unless the authorization is terminated or revoked sooner. When diagnostic testing is negative, the possibility of a false negative result should be considered in the context of a patient's recent exposures and the presence of clinical signs and symptoms consistent with COVID-19. An individual without symptoms of COVID-19 and who is not shedding SARS-CoV-2 virus would  expect to have a negative (not detected) result in this assay.   Culture, blood (single) w Reflex to ID Panel     Status: None (Preliminary result)   Collection Time: 03/27/19  6:09 AM   Specimen: BLOOD  Result Value Ref Range Status   Spec[facility]  Final   Special Requests   Final    BOTTLES DRAWN AEROBIC AND ANAEROBIC Blood Culture adequate volume   Culture   Final    NO GROWTH 4 DAYS Performed at [facility], [address] [place], [place] [id number]    Report Status PENDING  Incomplete     Time coordinating discharge:  45 minutes  SIGNED:   Dorcas CarrowKuber Kyrollos Cordell, MD  [facility] 03/31/2019, 11:22 AM

## 2019-04-01 ENCOUNTER — Other Ambulatory Visit: Payer: Self-pay

## 2019-04-01 ENCOUNTER — Emergency Department
Admission: EM | Admit: 2019-04-01 | Discharge: 2019-04-02 | Disposition: A | Discharge status: Home / Self Care | Payer: Medicaid Other | Attending: Emergency Medicine | Admitting: Emergency Medicine

## 2019-04-01 DIAGNOSIS — E119 Type 2 diabetes mellitus without complications: Secondary | ICD-10-CM | POA: Insufficient documentation

## 2019-04-01 DIAGNOSIS — Z87891 Personal history of nicotine dependence: Secondary | ICD-10-CM | POA: Diagnosis not present

## 2019-04-01 DIAGNOSIS — Z950 Presence of cardiac pacemaker: Secondary | ICD-10-CM | POA: Insufficient documentation

## 2019-04-01 DIAGNOSIS — U071 COVID-19: Secondary | ICD-10-CM

## 2019-04-01 DIAGNOSIS — I110 Hypertensive heart disease with heart failure: Secondary | ICD-10-CM | POA: Insufficient documentation

## 2019-04-01 DIAGNOSIS — I5032 Chronic diastolic (congestive) heart failure: Secondary | ICD-10-CM | POA: Diagnosis not present

## 2019-04-01 DIAGNOSIS — E039 Hypothyroidism, unspecified: Secondary | ICD-10-CM | POA: Insufficient documentation

## 2019-04-01 DIAGNOSIS — R799 Abnormal finding of blood chemistry, unspecified: Secondary | ICD-10-CM | POA: Diagnosis not present

## 2019-04-01 DIAGNOSIS — J449 Chronic obstructive pulmonary disease, unspecified: Secondary | ICD-10-CM | POA: Diagnosis not present

## 2019-04-01 DIAGNOSIS — R509 Fever, unspecified: Secondary | ICD-10-CM

## 2019-04-01 DIAGNOSIS — Z79899 Other long term (current) drug therapy: Secondary | ICD-10-CM | POA: Insufficient documentation

## 2019-04-01 LAB — CBC WITH DIFFERENTIAL/PLATELET
Abs Immature Granulocytes: 0.05 10*3/uL (ref 0.00–0.07)
Basophils Absolute: 0 10*3/uL (ref 0.0–0.1)
Basophils Relative: 0 %
Eosinophils Absolute: 0 10*3/uL (ref 0.0–0.5)
Eosinophils Relative: 0 %
HCT: 35.9 % — ABNORMAL LOW (ref 36.0–46.0)
Hemoglobin: 12 g/dL (ref 12.0–15.0)
Immature Granulocytes: 1 %
Lymphocytes Relative: 18 %
Lymphs Abs: 0.8 10*3/uL (ref 0.7–4.0)
MCH: 26.1 pg (ref 26.0–34.0)
MCHC: 33.4 g/dL (ref 30.0–36.0)
MCV: 78.2 fL — ABNORMAL LOW (ref 80.0–100.0)
Monocytes Absolute: 0.3 10*3/uL (ref 0.1–1.0)
Monocytes Relative: 6 %
Neutro Abs: 3.3 10*3/uL (ref 1.7–7.7)
Neutrophils Relative %: 75 %
Platelets: 160 10*3/uL (ref 150–400)
RBC: 4.59 MIL/uL (ref 3.87–5.11)
RDW: 14.6 % (ref 11.5–15.5)
WBC: 4.5 10*3/uL (ref 4.0–10.5)
nRBC: 0 % (ref 0.0–0.2)

## 2019-04-01 LAB — COMPREHENSIVE METABOLIC PANEL
ALT: 26 U/L (ref 0–44)
AST: 24 U/L (ref 15–41)
Albumin: 3.6 g/dL (ref 3.5–5.0)
Alkaline Phosphatase: 62 U/L (ref 38–126)
Anion gap: 12 (ref 5–15)
BUN: 15 mg/dL (ref 6–20)
CO2: 21 mmol/L — ABNORMAL LOW (ref 22–32)
Calcium: 8.8 mg/dL — ABNORMAL LOW (ref 8.9–10.3)
Chloride: 103 mmol/L (ref 98–111)
Creatinine, Ser: 0.89 mg/dL (ref 0.44–1.00)
GFR calc Af Amer: >60 mL/min (ref >60)
GFR calc non Af Amer: >60 mL/min (ref >60)
Glucose, Bld: 258 mg/dL — ABNORMAL HIGH (ref 70–99)
Potassium: 3.7 mmol/L (ref 3.5–5.1)
Sodium: 136 mmol/L (ref 135–145)
Total Bilirubin: 0.9 mg/dL (ref 0.3–1.2)
Total Protein: 7.7 g/dL (ref 6.5–8.1)

## 2019-04-01 LAB — CULTURE, BLOOD (SINGLE)
Culture: NO GROWTH
Special Requests: ADEQUATE

## 2019-04-01 LAB — LACTIC ACID, PLASMA: Lactic Acid, Venous: 1.9 mmol/L (ref 0.5–1.9)

## 2019-04-01 MED ORDER — DEXAMETHASONE SODIUM PHOSPHATE 10 MG/ML IJ SOLN
6.0000 mg | Freq: Once | INTRAMUSCULAR | Status: AC
  Administered 2019-04-01: 6 mg via INTRAVENOUS
  Filled 2019-04-01: qty 1

## 2019-04-01 NOTE — Discharge Instructions (Addendum)
Please start taking your dexamethasone prescription tomorrow. Please seek medical care for any worsening or significant shortness of breath, persistent vomiting or any other new or concerning symptoms.

## 2019-04-01 NOTE — ED Notes (Signed)
hepa filter placed in room and precaution sign on door

## 2019-04-01 NOTE — ED Provider Notes (Signed)
[facility] Emergency Department Provider Note  ____________________________________________   I have reviewed the triage vital signs and the nursing notes.   HISTORY  Chief Complaint Shortness of Breath   History limited by: Not Limited   HPI [name] [name] is a 58 y.o. female who presents to the emergency department today because of concerns for shortness of breath in the setting of recent Covid infection.  Patient was discharged from the hospital yesterday after stay for Covid.  She is not yet been able to get her prescription filled, which she thought was for an antibiotic.  She continued to have shortness of breath and weakness today.  Patient denies any chest pain.  Has had fevers today.  Has had body aches.  Records reviewed. Per medical record review patient has a history of recent admission for Covid.  Discharge yesterday.  It appears patient was given prescription for Decadron.  I do not see any antibiotic prescriptions on paperwork.  Past Medical History:  Diagnosis Date  . Abdominal wall hernia 01/29/2013  . Anxiety   . Arthritis    Rheumatoid  . C. difficile colitis   . Chronic diastolic heart failure (HCC)   . Depression   . Diabetes mellitus    states no meds or diet restrictions  at present  . Diastolic CHF (HCC)   . Esophagitis   . Fluid retention   . GERD (gastroesophageal reflux disease)   . Hiatal hernia   . Hypertension   . Hypokalemia due to loss of potassium 10/21/2015   Overview:  Associated with 3 weeks of diarrhea  And QT prolongation.  . Hypothyroidism   . IBS (irritable bowel syndrome)   . Moderate episode of recurrent major depressive disorder (HCC) 06/03/2004  . Morbid obesity (HCC)   . MRSA (methicillin resistant Staphylococcus aureus) infection 11/2017   left inner thigh abcess  . Neurogenic bladder    has pacemaker  . Neuropathy   . Obesity   . Panic attacks   . Rheumatoid arthritis (HCC)   . Sleep apnea     STATES SEVERE, CANT TOLERATE MASK- LAST STUDY YEARS AGO    Patient Active Problem List   Diagnosis Date Noted  . COVID-19 03/27/2019  . Pneumonia due to COVID-19 virus 03/27/2019  . Abdominal pain 02/12/2019  . AKI (acute kidney injury) (HCC) 02/12/2019  . Cellulitis, abdominal wall 02/04/2019  . Elevated C-reactive protein (CRP) 12/15/2018  . Elevated sed rate 12/15/2018  . Pharmacologic therapy 11/06/2018  . Disorder of skeletal system 11/06/2018  . Problems influencing health status 11/06/2018  . Decubitus ulcer of heel, bilateral 02/21/2018  . Diabetic ulcer of toe of right foot associated with type 2 diabetes mellitus (HCC) 02/14/2018  . Ulcer of left heel and midfoot with fat layer exposed (HCC) 02/14/2018  . MRSA (methicillin resistant Staphylococcus aureus) infection 11/17/2017  . Cellulitis 11/15/2017  . Perineal abscess 11/15/2017  . Subacute vulvitis 11/01/2017  . Chronic cystitis 03/01/2017  . Chronic pain syndrome 03/31/2016  . Insomnia secondary to chronic pain 03/31/2016  . Chronic upper back pain 12/25/2015  . Chronic hand pain (Bilateral) (L>R) 12/25/2015  . Rheumatoid arthritis (HCC) 12/25/2015  . Osteoarthritis, multiple sites 12/24/2015  . Chronic foot pain (Primary Area of Pain) (Bilateral) (L>R) 12/24/2015  . Chronic elbow pain (Third area of Pain) (Bilateral) (L>R) 12/24/2015  . Chronic shoulder pain (Bilateral) (L>R) 12/24/2015  . Chronic neck pain (Bilateral) (R>L) 12/24/2015  . 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[employer]) 12/23/2015  .  Chronic knee pain (Bilateral) (R>L) 12/23/2015  . Long term current use of opiate analgesic 12/23/2015  . Long term prescription opiate use 12/23/2015  . Opiate use (30 MME/Day) 12/23/2015  . Neurogenic pain 12/23/2015  . Neuropathic pain 12/23/2015  . Diabetic peripheral neuropathy (HCC) 12/23/2015  . Encounter for therapeutic drug level monitoring 12/23/2015  . Encounter for pain management  planning 12/23/2015  . GERD (gastroesophageal reflux disease) 11/25/2015  . Neuropathy 11/25/2015  . Diarrhea 10/22/2015  . Hypokalemia 10/21/2015  . Hypomagnesemia 10/21/2015  . QT prolongation 10/21/2015  . 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 Chronic wrist pain (Secondary area of Pain) (Bilateral) (L>R) 03/19/2015  . 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 Female genuine stress incontinence 02/14/2015  . Urge incontinence of urine 02/14/2015  . Obstructive apnea 07/03/2014  . Chronic diastolic CHF (congestive heart failure), NYHA class 3 (HCC) 07/03/2014  . Anxiety 01/03/2014  . Diabetic ulcer of heel (HCC) 01/03/2014  . COPD (chronic obstructive pulmonary disease) (HCC) 01/03/2014  . Bipolar disorder, unspecified (HCC) 01/03/2014  . Diastolic dysfunction 01/03/2014  . Combined fat and carbohydrate induced hyperlipemia 01/03/2014  . Shortness of breath 01/03/2014  . Acute on chronic congestive heart failure (HCC) 01/03/2014  . Incomplete bladder emptying 11/02/2012  . Bladder retention 10/10/2012  . Detrusor muscle hypertonia 10/04/2012  . Obstruction of urinary tract 10/04/2012  . FOM (frequency of micturition) 10/04/2012  . Mixed incontinence 10/04/2012  . Vitamin D insufficiency 04/15/2012  . Borderline personality disorder (HCC) 01/06/2012  . Mixed anxiety depressive disorder 01/06/2012  . History of laparoscopic adjustable gastric banding, 03/20/2007.  Removed 09/19/2011. 08/04/2011  . Nausea & vomiting 08/04/2011  . Hypothyroidism 06/28/2010  . Rheumatoid arthritis flare (HCC) 06/28/2010  . Obesity 03/29/2005  . Essential (primary) hypertension 01/27/2005  . Major depressive disorder, recurrent episode, moderate (HCC) 06/03/2004    Past Surgical History:  Procedure Laterality Date  . ABDOMINAL HYSTERECTOMY    . CHOLECYSTECTOMY    . DG GREAT TOE RIGHT FOOT  02/23/2018  . EYE SURGERY     bilateral cataract extraction with IOL  . HERNIA REPAIR      ventral hernia with strangulation  . LAPAROSCOPIC GASTRIC BANDING  03/20/07  . TONSILLECTOMY    . TUBAL LIGATION      Prior to Admission medications   Medication Sig Start Date End Date Taking? Authorizing Provider  ARIPiprazole (ABILIFY) 5 MG tablet TAKE 1 TABLET BY MOUTH ONCE DAILY. 02/22/19   Clapacs, Jackquline Denmark, MD  atorvastatin (LIPITOR) 80 MG tablet Take 80 mg by mouth daily at 6 PM.  02/09/13   [provider]  busPIRone (BUSPAR) 10 MG tablet TAKE 1 TABLET BY MOUTH TWICE DAILY 02/05/19   Clapacs, Jackquline Denmark, MD  Calcium Carb-Ergocalciferol 250-125 MG-UNIT TABS Take 1 tablet by mouth daily.     [provider]  Cholecalciferol (VITAMIN D3) 125 MCG (5000 UT) CAPS Take 1 capsule (5,000 Units total) by mouth daily with breakfast. Take along with calcium and magnesium. 02/14/19 02/14/20  Delano Metz, MD  dexamethasone (DECADRON) 6 MG tablet Take 1 tablet (6 mg total) by mouth daily for 5 days. 03/31/19 04/05/19  Dorcas Carrow, MD  FLUoxetine (PROZAC) 20 MG capsule TAKE (1) CAPSULE BY MOUTH ONCE DAILY. 11/30/18   Clapacs, Jackquline Denmark, MD  folic acid (FOLVITE) 1 MG tablet Take 1 mg by mouth daily.     [provider]  Insulin Degludec (TRESIBA FLEXTOUCH) 200 UNIT/ML SOPN Inject 65-100 Units into the  skin daily.    [provider]  levothyroxine (SYNTHROID, LEVOTHROID) 88 MCG tablet Take 88 mcg by mouth daily before breakfast.    [provider]  lisinopril (PRINIVIL,ZESTRIL) 2.5 MG tablet Take 2.5 mg by mouth daily.    [provider]  metFORMIN (GLUCOPHAGE) 500 MG tablet Take 500 mg by mouth 2 (two) times daily with a meal.     [provider]  methotrexate 50 MG/2ML injection Inject 50 mg/m2 into the muscle once a week. Friday    [provider]  oxyCODONE (OXY IR/ROXICODONE) 5 MG immediate release tablet Take 1 tablet (5 mg total) by mouth every 6 (six) hours as needed for severe pain. Must last 30 days 03/16/19 04/15/19   Delano MetzNaveira, Francisco, MD  oxyCODONE (OXY IR/ROXICODONE) 5 MG immediate release tablet Take 1 tablet (5 mg total) by mouth every 6 (six) hours as needed for severe pain. Must last 30 days 04/15/19 05/15/19  Delano MetzNaveira, Francisco, MD  pramipexole (MIRAPEX) 0.125 MG tablet Take 0.125 mg by mouth 2 (two) times daily. 03/08/19   [provider]  pregabalin (LYRICA) 150 MG capsule Take 1 capsule (150 mg total) by mouth 3 (three) times daily. 02/14/19 08/13/19  Delano MetzNaveira, Francisco, MD  QUEtiapine (SEROQUEL) 300 MG tablet TAKE ONE TABLET BY MOUTH AT BEDTIME. 02/05/19   Clapacs, Jackquline DenmarkJohn T, MD  VESICARE 5 MG tablet Take 5 mg by mouth daily. 03/16/19   [provider]  zolpidem (AMBIEN) 5 MG tablet TAKE 1 TABLET BY MOUTH EVERYDAY AT BEDTIME 11/30/18   Clapacs, Jackquline DenmarkJohn T, MD    Allergies Cephalexin, Codeine, Doxycycline, Propoxyphene, Sulfa antibiotics, Lovenox [enoxaparin sodium], Hydrocodone, and Meropenem  Family History  Problem Relation Age of Onset  . Heart failure Father   . Bipolar disorder Father   . Alcohol abuse Father   . Anxiety disorder Father   . Depression Father   . Heart disease Brother   . Heart attack Brother [id number]       MI s/p stents placed  . Anxiety disorder Sister   . Depression Sister   . Anxiety disorder Sister   . Depression Sister   . Bipolar disorder Sister   . Alcohol abuse Sister   . Drug abuse Sister   . Heart attack Brother     Social History Social History   Tobacco Use  . Smoking status: Former Smoker    Packs/day: 2.00    Years: 27.00    Pack years: 54.00    Types: Cigarettes    Quit date: 07/30/1999    Years since quitting: 19.6  . Smokeless tobacco: Never Used  Substance Use Topics  . Alcohol use: No  . Drug use: No    Review of Systems Constitutional: Positive for fever Eyes: No visual changes. ENT: No sore throat. Cardiovascular: Denies chest pain. Respiratory: Positive for shortness of breath Gastrointestinal: No abdominal pain.  No  nausea, no vomiting.  No diarrhea.   Genitourinary: Negative for dysuria. Musculoskeletal: Positive for body aches Skin: Negative for rash. Neurological: Negative for headaches, focal weakness or numbness.  ____________________________________________   PHYSICAL EXAM:  VITAL SIGNS: ED Triage Vitals  Enc Vitals Group     BP 04/01/19 2203 123/62     Pulse Rate 04/01/19 2142 91     Resp 04/01/19 2142 (!) 23     Temp 04/01/19 2142 (!) 103.3 F (39.6 C)     Temp Source 04/01/19 2142 Oral     SpO2 04/01/19 2138 97 %  Weight 04/01/19 2144 300 lb (136.1 kg)     Height 04/01/19 2144 5\' 6"  (1.676 m)     Head Circumference --      Peak Flow --      Pain Score 04/01/19 2143 8   Constitutional: Alert and oriented.  Eyes: Conjunctivae are normal.  ENT      Head: Normocephalic and atraumatic.      Nose: No congestion/rhinnorhea.      Mouth/Throat: Mucous membranes are moist.      Neck: No stridor. Hematological/Lymphatic/Immunilogical: No cervical lymphadenopathy. Cardiovascular: Normal rate, regular rhythm.  No murmurs, rubs, or gallops.  Respiratory: Slight tachypnea. Gastrointestinal: Soft and non tender. No rebound. No guarding.  Genitourinary: Deferred Musculoskeletal: Normal range of motion in all extremities. No lower extremity edema. Neurologic:  Normal speech and language. No gross focal neurologic deficits are appreciated.  Skin:  Skin is warm, dry and intact. No rash noted. Psychiatric: Mood and affect are normal. Speech and behavior are normal. Patient exhibits appropriate insight and judgment.  ____________________________________________    LABS (pertinent positives/negatives)  Lactic 1.9 CBC wbc 4.5, hgb 12.0, plt 160 CMP wnl except co2 21, glu 258, ca 8.8  ____________________________________________   EKG  I, Nance Pear, attending physician, personally viewed and interpreted this EKG  EKG Time: 2200 Rate: 90 Rhythm: sinus rhythm Axis:  normal Intervals: qtc 432 QRS: narrow ST changes: no st elevation Impression: abnormal ekg ____________________________________________    RADIOLOGY  None  ____________________________________________   PROCEDURES  Procedures  ____________________________________________   INITIAL IMPRESSION / ASSESSMENT AND PLAN / ED COURSE  Pertinent labs & imaging results that were available during my care of the patient were reviewed by me and considered in my medical decision making (see chart for details).   Patient presented to the emergency department today because of concerns for shortness of breath in the setting of Covid.  Patient is not hypoxic here.  She had not received Decadron today because she not gotten a prescription for only.  Blood work without concerning leukocytosis.  Patient does not appear toxic.  Will give patient dose of Decadron here.  I do think is reasonable for patient be discharged home.  Discussed this with the patient. She did feel comfortable with the plan.  ____________________________________________   FINAL CLINICAL IMPRESSION(S) / ED DIAGNOSES  Final diagnoses:  COVID-19  Fever, unspecified fever cause     Note: This dictation was prepared with Dragon dictation. Any transcriptional errors that result from this process are unintentional     Nance Pear, MD 04/02/19 1526

## 2019-04-01 NOTE — ED Notes (Signed)
Patient awaiting ride

## 2019-04-01 NOTE — ED Triage Notes (Addendum)
Per ems pt was recently discharged from [facility] with covid and pneumonia and became sob and febrile today. Pt c/o sob and body aches, denies cp/n/v/dizziness. Pt states she missed a day of home antibiotics.  Pt aox4 nad noted. Pt hot to touch. Respirations labored.

## 2019-04-05 NOTE — Unmapped (Signed)
Called Ms. Lehmkuhl because I saw that she got diagnosed with COVID-19 infection and went to [facility] ED on 12/13, given dexamethasone. She states that she feels terrible, has pneumonia, nausea/vomiting. She took her methotrexate last 2 weeks ago and has been taking Actemra weekly.    Due to her current infection and GI side effects of the infection, I have asked her to hold both her methotrexate and her Actemra until her infection resolves. I am scheduled to see her on 1/15 in clinic and we can decide to resume medications around that time once she feels better and infection is resolved.    She was understanding of this plan.    Tommy Rainwater, MD, PGY-4  [facility]  [phone]

## 2019-04-06 ENCOUNTER — Emergency Department: Payer: Medicaid Other

## 2019-04-06 ENCOUNTER — Inpatient Hospital Stay
Admission: EM | Admit: 2019-04-06 | Discharge: 2019-05-04 | DRG: 177 | Disposition: A | Discharge status: Home Health | Payer: Medicaid Other | Attending: Internal Medicine | Admitting: Internal Medicine

## 2019-04-06 ENCOUNTER — Encounter: Payer: Self-pay | Admitting: Emergency Medicine

## 2019-04-06 DIAGNOSIS — I5033 Acute on chronic diastolic (congestive) heart failure: Secondary | ICD-10-CM | POA: Diagnosis present

## 2019-04-06 DIAGNOSIS — E1122 Type 2 diabetes mellitus with diabetic chronic kidney disease: Secondary | ICD-10-CM

## 2019-04-06 DIAGNOSIS — J80 Acute respiratory distress syndrome: Secondary | ICD-10-CM | POA: Diagnosis present

## 2019-04-06 DIAGNOSIS — Z885 Allergy status to narcotic agent status: Secondary | ICD-10-CM

## 2019-04-06 DIAGNOSIS — F329 Major depressive disorder, single episode, unspecified: Secondary | ICD-10-CM | POA: Diagnosis present

## 2019-04-06 DIAGNOSIS — N179 Acute kidney failure, unspecified: Secondary | ICD-10-CM | POA: Diagnosis not present

## 2019-04-06 DIAGNOSIS — G4733 Obstructive sleep apnea (adult) (pediatric): Secondary | ICD-10-CM | POA: Diagnosis present

## 2019-04-06 DIAGNOSIS — Z9841 Cataract extraction status, right eye: Secondary | ICD-10-CM

## 2019-04-06 DIAGNOSIS — Z881 Allergy status to other antibiotic agents status: Secondary | ICD-10-CM | POA: Diagnosis not present

## 2019-04-06 DIAGNOSIS — Z79890 Hormone replacement therapy (postmenopausal): Secondary | ICD-10-CM

## 2019-04-06 DIAGNOSIS — J9690 Respiratory failure, unspecified, unspecified whether with hypoxia or hypercapnia: Secondary | ICD-10-CM

## 2019-04-06 DIAGNOSIS — E785 Hyperlipidemia, unspecified: Secondary | ICD-10-CM | POA: Diagnosis present

## 2019-04-06 DIAGNOSIS — K219 Gastro-esophageal reflux disease without esophagitis: Secondary | ICD-10-CM | POA: Diagnosis present

## 2019-04-06 DIAGNOSIS — Z79891 Long term (current) use of opiate analgesic: Secondary | ICD-10-CM

## 2019-04-06 DIAGNOSIS — Z6841 Body Mass Index (BMI) 40.0 and over, adult: Secondary | ICD-10-CM | POA: Diagnosis not present

## 2019-04-06 DIAGNOSIS — I509 Heart failure, unspecified: Secondary | ICD-10-CM | POA: Diagnosis not present

## 2019-04-06 DIAGNOSIS — I5030 Unspecified diastolic (congestive) heart failure: Secondary | ICD-10-CM | POA: Diagnosis not present

## 2019-04-06 DIAGNOSIS — M069 Rheumatoid arthritis, unspecified: Secondary | ICD-10-CM | POA: Diagnosis not present

## 2019-04-06 DIAGNOSIS — Z89411 Acquired absence of right great toe: Secondary | ICD-10-CM | POA: Diagnosis not present

## 2019-04-06 DIAGNOSIS — G4730 Sleep apnea, unspecified: Secondary | ICD-10-CM | POA: Diagnosis present

## 2019-04-06 DIAGNOSIS — R6520 Severe sepsis without septic shock: Secondary | ICD-10-CM

## 2019-04-06 DIAGNOSIS — E1165 Type 2 diabetes mellitus with hyperglycemia: Secondary | ICD-10-CM | POA: Diagnosis not present

## 2019-04-06 DIAGNOSIS — I110 Hypertensive heart disease with heart failure: Secondary | ICD-10-CM | POA: Diagnosis not present

## 2019-04-06 DIAGNOSIS — Z888 Allergy status to other drugs, medicaments and biological substances status: Secondary | ICD-10-CM

## 2019-04-06 DIAGNOSIS — N183 Chronic kidney disease, stage 3 unspecified: Secondary | ICD-10-CM | POA: Diagnosis present

## 2019-04-06 DIAGNOSIS — I5032 Chronic diastolic (congestive) heart failure: Secondary | ICD-10-CM | POA: Diagnosis present

## 2019-04-06 DIAGNOSIS — Z7984 Long term (current) use of oral hypoglycemic drugs: Secondary | ICD-10-CM

## 2019-04-06 DIAGNOSIS — U071 COVID-19: Principal | ICD-10-CM

## 2019-04-06 DIAGNOSIS — I130 Hypertensive heart and chronic kidney disease with heart failure and stage 1 through stage 4 chronic kidney disease, or unspecified chronic kidney disease: Secondary | ICD-10-CM | POA: Diagnosis present

## 2019-04-06 DIAGNOSIS — Z89421 Acquired absence of other right toe(s): Secondary | ICD-10-CM | POA: Diagnosis not present

## 2019-04-06 DIAGNOSIS — Z79899 Other long term (current) drug therapy: Secondary | ICD-10-CM

## 2019-04-06 DIAGNOSIS — Z882 Allergy status to sulfonamides status: Secondary | ICD-10-CM | POA: Diagnosis not present

## 2019-04-06 DIAGNOSIS — Z950 Presence of cardiac pacemaker: Secondary | ICD-10-CM | POA: Diagnosis not present

## 2019-04-06 DIAGNOSIS — Z8249 Family history of ischemic heart disease and other diseases of the circulatory system: Secondary | ICD-10-CM

## 2019-04-06 DIAGNOSIS — Z818 Family history of other mental and behavioral disorders: Secondary | ICD-10-CM

## 2019-04-06 DIAGNOSIS — N1831 Chronic kidney disease, stage 3a: Secondary | ICD-10-CM | POA: Diagnosis not present

## 2019-04-06 DIAGNOSIS — J984 Other disorders of lung: Secondary | ICD-10-CM | POA: Diagnosis not present

## 2019-04-06 DIAGNOSIS — F410 Panic disorder [episodic paroxysmal anxiety] without agoraphobia: Secondary | ICD-10-CM | POA: Diagnosis present

## 2019-04-06 DIAGNOSIS — Z811 Family history of alcohol abuse and dependence: Secondary | ICD-10-CM

## 2019-04-06 DIAGNOSIS — R0902 Hypoxemia: Secondary | ICD-10-CM

## 2019-04-06 DIAGNOSIS — E1129 Type 2 diabetes mellitus with other diabetic kidney complication: Secondary | ICD-10-CM | POA: Diagnosis not present

## 2019-04-06 DIAGNOSIS — J1282 Pneumonia due to coronavirus disease 2019: Secondary | ICD-10-CM | POA: Diagnosis present

## 2019-04-06 DIAGNOSIS — E039 Hypothyroidism, unspecified: Secondary | ICD-10-CM | POA: Diagnosis present

## 2019-04-06 DIAGNOSIS — G894 Chronic pain syndrome: Secondary | ICD-10-CM | POA: Diagnosis present

## 2019-04-06 DIAGNOSIS — A4189 Other specified sepsis: Secondary | ICD-10-CM

## 2019-04-06 DIAGNOSIS — Z87891 Personal history of nicotine dependence: Secondary | ICD-10-CM

## 2019-04-06 DIAGNOSIS — E119 Type 2 diabetes mellitus without complications: Secondary | ICD-10-CM | POA: Diagnosis present

## 2019-04-06 DIAGNOSIS — J9600 Acute respiratory failure, unspecified whether with hypoxia or hypercapnia: Secondary | ICD-10-CM

## 2019-04-06 DIAGNOSIS — D6859 Other primary thrombophilia: Secondary | ICD-10-CM | POA: Diagnosis present

## 2019-04-06 DIAGNOSIS — N319 Neuromuscular dysfunction of bladder, unspecified: Secondary | ICD-10-CM | POA: Diagnosis present

## 2019-04-06 DIAGNOSIS — J069 Acute upper respiratory infection, unspecified: Secondary | ICD-10-CM

## 2019-04-06 DIAGNOSIS — Z8614 Personal history of Methicillin resistant Staphylococcus aureus infection: Secondary | ICD-10-CM

## 2019-04-06 DIAGNOSIS — Z794 Long term (current) use of insulin: Secondary | ICD-10-CM

## 2019-04-06 DIAGNOSIS — Z23 Encounter for immunization: Secondary | ICD-10-CM

## 2019-04-06 DIAGNOSIS — Z961 Presence of intraocular lens: Secondary | ICD-10-CM | POA: Diagnosis present

## 2019-04-06 DIAGNOSIS — Z7189 Other specified counseling: Secondary | ICD-10-CM | POA: Diagnosis not present

## 2019-04-06 DIAGNOSIS — Z9884 Bariatric surgery status: Secondary | ICD-10-CM

## 2019-04-06 DIAGNOSIS — Z813 Family history of other psychoactive substance abuse and dependence: Secondary | ICD-10-CM

## 2019-04-06 DIAGNOSIS — J9601 Acute respiratory failure with hypoxia: Secondary | ICD-10-CM | POA: Diagnosis not present

## 2019-04-06 DIAGNOSIS — E1142 Type 2 diabetes mellitus with diabetic polyneuropathy: Secondary | ICD-10-CM | POA: Diagnosis present

## 2019-04-06 DIAGNOSIS — Z9049 Acquired absence of other specified parts of digestive tract: Secondary | ICD-10-CM

## 2019-04-06 DIAGNOSIS — Z8739 Personal history of other diseases of the musculoskeletal system and connective tissue: Secondary | ICD-10-CM | POA: Diagnosis not present

## 2019-04-06 DIAGNOSIS — N1832 Chronic kidney disease, stage 3b: Secondary | ICD-10-CM | POA: Diagnosis present

## 2019-04-06 DIAGNOSIS — Z9842 Cataract extraction status, left eye: Secondary | ICD-10-CM

## 2019-04-06 DIAGNOSIS — R0602 Shortness of breath: Secondary | ICD-10-CM

## 2019-04-06 DIAGNOSIS — A419 Sepsis, unspecified organism: Secondary | ICD-10-CM | POA: Diagnosis present

## 2019-04-06 DIAGNOSIS — M0579 Rheumatoid arthritis with rheumatoid factor of multiple sites without organ or systems involvement: Secondary | ICD-10-CM | POA: Diagnosis present

## 2019-04-06 DIAGNOSIS — Z90710 Acquired absence of both cervix and uterus: Secondary | ICD-10-CM

## 2019-04-06 DIAGNOSIS — Z515 Encounter for palliative care: Secondary | ICD-10-CM | POA: Diagnosis not present

## 2019-04-06 LAB — INFLUENZA PANEL BY PCR (TYPE A & B)
Influenza A By PCR: NEGATIVE
Influenza B By PCR: NEGATIVE

## 2019-04-06 LAB — URINALYSIS, ROUTINE W REFLEX MICROSCOPIC
Bacteria, UA: NONE SEEN
Bilirubin Urine: NEGATIVE
Glucose, UA: >=500 mg/dL — AB
Hgb urine dipstick: NEGATIVE
Ketones, ur: NEGATIVE mg/dL
Nitrite: NEGATIVE
Protein, ur: 30 mg/dL — AB
RBC / HPF: >50 RBC/hpf — ABNORMAL HIGH (ref 0–5)
Specific Gravity, Urine: 1.04 — ABNORMAL HIGH (ref 1.005–1.030)
WBC, UA: >50 WBC/hpf — ABNORMAL HIGH (ref 0–5)
pH: 6 (ref 5.0–8.0)

## 2019-04-06 LAB — COMPREHENSIVE METABOLIC PANEL
ALT: 16 U/L (ref 0–44)
AST: 26 U/L (ref 15–41)
Albumin: 2.5 g/dL — ABNORMAL LOW (ref 3.5–5.0)
Alkaline Phosphatase: 51 U/L (ref 38–126)
Anion gap: 14 (ref 5–15)
BUN: 33 mg/dL — ABNORMAL HIGH (ref 6–20)
CO2: 20 mmol/L — ABNORMAL LOW (ref 22–32)
Calcium: 8.5 mg/dL — ABNORMAL LOW (ref 8.9–10.3)
Chloride: 97 mmol/L — ABNORMAL LOW (ref 98–111)
Creatinine, Ser: 1.32 mg/dL — ABNORMAL HIGH (ref 0.44–1.00)
GFR calc Af Amer: 52 mL/min — ABNORMAL LOW (ref >60)
GFR calc non Af Amer: 45 mL/min — ABNORMAL LOW (ref >60)
Glucose, Bld: 577 mg/dL (ref 70–99)
Potassium: 3.9 mmol/L (ref 3.5–5.1)
Sodium: 131 mmol/L — ABNORMAL LOW (ref 135–145)
Total Bilirubin: 0.8 mg/dL (ref 0.3–1.2)
Total Protein: 6.8 g/dL (ref 6.5–8.1)

## 2019-04-06 LAB — CBC WITH DIFFERENTIAL/PLATELET
Abs Immature Granulocytes: 0.02 10*3/uL (ref 0.00–0.07)
Basophils Absolute: 0 10*3/uL (ref 0.0–0.1)
Basophils Relative: 0 %
Eosinophils Absolute: 0 10*3/uL (ref 0.0–0.5)
Eosinophils Relative: 0 %
HCT: 31.1 % — ABNORMAL LOW (ref 36.0–46.0)
Hemoglobin: 10.1 g/dL — ABNORMAL LOW (ref 12.0–15.0)
Immature Granulocytes: 0 %
Lymphocytes Relative: 9 %
Lymphs Abs: 0.4 10*3/uL — ABNORMAL LOW (ref 0.7–4.0)
MCH: 26.4 pg (ref 26.0–34.0)
MCHC: 32.5 g/dL (ref 30.0–36.0)
MCV: 81.2 fL (ref 80.0–100.0)
Monocytes Absolute: 0.3 10*3/uL (ref 0.1–1.0)
Monocytes Relative: 5 %
Neutro Abs: 4.3 10*3/uL (ref 1.7–7.7)
Neutrophils Relative %: 86 %
Platelets: 152 10*3/uL (ref 150–400)
RBC: 3.83 MIL/uL — ABNORMAL LOW (ref 3.87–5.11)
RDW: 15.1 % (ref 11.5–15.5)
WBC: 5 10*3/uL (ref 4.0–10.5)
nRBC: 0 % (ref 0.0–0.2)

## 2019-04-06 LAB — CULTURE, BLOOD (ROUTINE X 2)
Culture: NO GROWTH
Culture: NO GROWTH
Special Requests: ADEQUATE

## 2019-04-06 LAB — BRAIN NATRIURETIC PEPTIDE: B Natriuretic Peptide: 42 pg/mL (ref 0.0–100.0)

## 2019-04-06 LAB — FERRITIN: Ferritin: 1097 ng/mL — ABNORMAL HIGH (ref 11–307)

## 2019-04-06 LAB — PROTIME-INR
INR: 1.3 — ABNORMAL HIGH (ref 0.8–1.2)
Prothrombin Time: 15.8 seconds — ABNORMAL HIGH (ref 11.4–15.2)

## 2019-04-06 LAB — FIBRIN DERIVATIVES D-DIMER (ARMC ONLY): Fibrin derivatives D-dimer (ARMC): 1405.71 ng/mL (FEU) — ABNORMAL HIGH (ref 0.00–499.00)

## 2019-04-06 LAB — LACTIC ACID, PLASMA
Lactic Acid, Venous: 3.9 mmol/L (ref 0.5–1.9)
Lactic Acid, Venous: 2.2 mmol/L (ref 0.5–1.9)
Lactic Acid, Venous: 1.8 mmol/L (ref 0.5–1.9)
Lactic Acid, Venous: 1.6 mmol/L (ref 0.5–1.9)

## 2019-04-06 LAB — GLUCOSE, CAPILLARY
Glucose-Capillary: 394 mg/dL — ABNORMAL HIGH (ref 70–99)
Glucose-Capillary: 392 mg/dL — ABNORMAL HIGH (ref 70–99)

## 2019-04-06 LAB — APTT: aPTT: 38 seconds — ABNORMAL HIGH (ref 24–36)

## 2019-04-06 LAB — MAGNESIUM: Magnesium: 1.9 mg/dL (ref 1.7–2.4)

## 2019-04-06 LAB — FIBRINOGEN: Fibrinogen: >750 mg/dL — ABNORMAL HIGH (ref 210–475)

## 2019-04-06 LAB — SEDIMENTATION RATE: Sed Rate: 107 mm/hr — ABNORMAL HIGH (ref 0–30)

## 2019-04-06 MED ORDER — VANCOMYCIN HCL IN DEXTROSE 1-5 GM/200ML-% IV SOLN
1000.0000 mg | Freq: Once | INTRAVENOUS | Status: AC
  Administered 2019-04-06: 1000 mg via INTRAVENOUS
  Filled 2019-04-06: qty 200

## 2019-04-06 MED ORDER — LACTATED RINGERS IV SOLN: INTRAVENOUS | Status: DC

## 2019-04-06 MED ORDER — SODIUM CHLORIDE 0.9 % IV SOLN: INTRAVENOUS | Status: DC

## 2019-04-06 MED ORDER — IVERMECTIN 3 MG PO TABS
200.0000 ug/kg | ORAL_TABLET | Freq: Once | ORAL | Status: AC
  Administered 2019-04-06: 18000 ug via ORAL
  Filled 2019-04-06: qty 6

## 2019-04-06 MED ORDER — LEVOFLOXACIN IN D5W 750 MG/150ML IV SOLN
750.0000 mg | Freq: Once | INTRAVENOUS | Status: AC
  Administered 2019-04-06: 750 mg via INTRAVENOUS
  Filled 2019-04-06: qty 150

## 2019-04-06 MED ORDER — SODIUM CHLORIDE 0.9 % IV BOLUS
1000.0000 mL | Freq: Once | INTRAVENOUS | Status: AC
  Administered 2019-04-06: 1000 mL via INTRAVENOUS

## 2019-04-06 MED ORDER — FAMOTIDINE 20 MG PO TABS
40.0000 mg | ORAL_TABLET | Freq: Every day | ORAL | Status: DC
  Administered 2019-04-06 – 2019-05-04 (×29): 40 mg via ORAL
  Filled 2019-04-06 (×29): qty 2

## 2019-04-06 MED ORDER — ALBUTEROL SULFATE HFA 108 (90 BASE) MCG/ACT IN AERS
2.0000 | INHALATION_SPRAY | RESPIRATORY_TRACT | Status: DC | PRN
  Filled 2019-04-06 (×2): qty 6.7

## 2019-04-06 MED ORDER — ASCORBIC ACID 500 MG PO TABS
500.0000 mg | ORAL_TABLET | Freq: Every day | ORAL | Status: DC
  Administered 2019-04-06 – 2019-05-04 (×29): 500 mg via ORAL
  Filled 2019-04-06 (×30): qty 1

## 2019-04-06 MED ORDER — ATORVASTATIN CALCIUM 20 MG PO TABS
80.0000 mg | ORAL_TABLET | Freq: Every day | ORAL | Status: DC
  Administered 2019-04-07 – 2019-05-03 (×27): 80 mg via ORAL
  Filled 2019-04-06 (×26): qty 4

## 2019-04-06 MED ORDER — IOHEXOL 350 MG/ML SOLN
75.0000 mL | Freq: Once | INTRAVENOUS | Status: AC | PRN
  Administered 2019-04-06: 75 mL via INTRAVENOUS

## 2019-04-06 MED ORDER — GUAIFENESIN-DM 100-10 MG/5ML PO SYRP
5.0000 mL | ORAL_SOLUTION | Freq: Two times a day (BID) | ORAL | Status: DC
  Administered 2019-04-06 – 2019-04-11 (×10): 5 mL via ORAL
  Filled 2019-04-06 (×11): qty 5

## 2019-04-06 MED ORDER — METHYLPREDNISOLONE SODIUM SUCC 125 MG IJ SOLR
60.0000 mg | Freq: Two times a day (BID) | INTRAMUSCULAR | Status: DC
  Administered 2019-04-06: 60 mg via INTRAVENOUS
  Filled 2019-04-06: qty 2

## 2019-04-06 MED ORDER — DM-GUAIFENESIN ER 30-600 MG PO TB12: 1.0000 | ORAL_TABLET | Freq: Two times a day (BID) | ORAL | Status: DC

## 2019-04-06 MED ORDER — ACETAMINOPHEN 325 MG PO TABS
650.0000 mg | ORAL_TABLET | Freq: Four times a day (QID) | ORAL | Status: DC | PRN
  Administered 2019-04-10 – 2019-05-01 (×2): 650 mg via ORAL
  Filled 2019-04-06 (×3): qty 2

## 2019-04-06 MED ORDER — ONDANSETRON HCL 4 MG/2ML IJ SOLN
4.0000 mg | Freq: Three times a day (TID) | INTRAMUSCULAR | Status: DC | PRN
  Administered 2019-04-07 – 2019-04-08 (×2): 4 mg via INTRAVENOUS
  Filled 2019-04-06 (×2): qty 2

## 2019-04-06 MED ORDER — INSULIN ASPART 100 UNIT/ML ~~LOC~~ SOLN
10.0000 [IU] | Freq: Once | SUBCUTANEOUS | Status: AC
  Administered 2019-04-06: 10 [IU] via INTRAVENOUS
  Filled 2019-04-06: qty 1

## 2019-04-06 MED ORDER — THIAMINE HCL 100 MG/ML IJ SOLN
200.0000 mg | Freq: Two times a day (BID) | INTRAVENOUS | Status: AC
  Administered 2019-04-06 – 2019-04-08 (×4): 200 mg via INTRAVENOUS
  Filled 2019-04-06 (×8): qty 2

## 2019-04-06 MED ORDER — ZINC SULFATE 220 (50 ZN) MG PO CAPS
220.0000 mg | ORAL_CAPSULE | Freq: Every day | ORAL | Status: DC
  Administered 2019-04-06 – 2019-05-04 (×29): 220 mg via ORAL
  Filled 2019-04-06 (×30): qty 1

## 2019-04-06 MED ORDER — INSULIN ASPART 100 UNIT/ML ~~LOC~~ SOLN
0.0000 [IU] | Freq: Three times a day (TID) | SUBCUTANEOUS | Status: DC
  Administered 2019-04-06: 15 [IU] via SUBCUTANEOUS
  Administered 2019-04-07: 5 [IU] via SUBCUTANEOUS
  Administered 2019-04-07: 11 [IU] via SUBCUTANEOUS
  Filled 2019-04-06 (×3): qty 1

## 2019-04-06 MED ORDER — IPRATROPIUM BROMIDE HFA 17 MCG/ACT IN AERS
2.0000 | INHALATION_SPRAY | RESPIRATORY_TRACT | Status: DC
  Administered 2019-04-06 – 2019-04-08 (×9): 2 via RESPIRATORY_TRACT
  Filled 2019-04-06 (×2): qty 12.9

## 2019-04-06 MED ORDER — INSULIN ASPART 100 UNIT/ML ~~LOC~~ SOLN
0.0000 [IU] | Freq: Every day | SUBCUTANEOUS | Status: DC
  Administered 2019-04-06: 5 [IU] via SUBCUTANEOUS
  Filled 2019-04-06 (×2): qty 1

## 2019-04-06 MED ORDER — HEPARIN BOLUS VIA INFUSION
4000.0000 [IU] | Freq: Once | INTRAVENOUS | Status: AC
  Administered 2019-04-06: 4000 [IU] via INTRAVENOUS
  Filled 2019-04-06: qty 4000

## 2019-04-06 MED ORDER — HEPARIN (PORCINE) 25000 UT/250ML-% IV SOLN
1600.0000 [IU]/h | INTRAVENOUS | Status: DC
  Administered 2019-04-06 – 2019-04-07 (×3): 1500 [IU]/h via INTRAVENOUS
  Administered 2019-04-08: 1700 [IU]/h via INTRAVENOUS
  Administered 2019-04-10: 1500 [IU]/h via INTRAVENOUS
  Filled 2019-04-06 (×8): qty 250

## 2019-04-06 MED ORDER — COLCHICINE 0.6 MG PO TABS
0.6000 mg | ORAL_TABLET | Freq: Two times a day (BID) | ORAL | Status: DC
  Administered 2019-04-06: 0.6 mg via ORAL
  Filled 2019-04-06 (×2): qty 1

## 2019-04-06 NOTE — Consult Note (Signed)
Reason for Consult: Acute hypoxic respiratory failure Referring Physician: Lorretta Harp, MD  [name] [name] is an 58 y.o. female.  HPI: This is a 58 year old former smoker, with a history of diabetes mellitus, morbid obesity and rheumatoid arthritis, who presented to the ED today with increasing shortness of breath in the setting of recently diagnosed COVID-19 infection.  The patient tested positive for COVID-19 on December 4 after she presented to the emergency room with a complaint of generalized malaise and "not feeling good".  Initially she was treated at home however on 8 December she presented to the emergency room because of worsening symptoms and was transferred to the [facility] campus for management of her COVID-19 disease.  Per records, she was treated with remdesivir for 5 days and cefazolin for 5 days and then discharged home with a prescription of dexamethasone.  She was discharged from [facility] on 12 December.  In the interim she had 1 more visit to the emergency room because she was unable to her dexamethasone and had some increasing shortness of breath though her oxygen saturations were good.  She continued to be treated at home but today presented because of increasing dyspnea and persistent fever to up to 103 per the emergency room physician notes.  The patient was noted to be hypoxic on arrival of EMS to her home she apparently had oxygen saturations in the 70s and was placed on nonrebreather with saturations improving.  The patient underwent work-up which included CT chest and this showed no PE, inflammatory mediastinal adenopathy and multifocal pneumonia mostly and a groundglass type appearance.  We were consulted to help with the management of her hypoxia and respiratory failure.  I was called at around 1800 hrs. that the patient's FiO2 requirements were increasing and she was in more distress.  On my evaluation of the patient she but could not tell me exactly what was her  main  complaint, she stated that she felt "bad all over" and short of breath.  She was clearly in respiratory distress and her oxygen saturations were 73% on 100% nonrebreather mask.  Because of the severity of her distress no further significant history could be obtained with the exception that she did endorse that she has rheumatoid arthritis and had been on Actemra and this was discontinued this morning by her rheumatologist.  We placed the patient on high flow O2 with her saturations improving after that.  Past Medical History:  Diagnosis Date  . Abdominal wall hernia 01/29/2013  . Anxiety   . Arthritis    Rheumatoid  . C. difficile colitis   . Chronic diastolic heart failure (HCC)   . Depression   . Diabetes mellitus    states no meds or diet restrictions  at present  . Diastolic CHF (HCC)   . Esophagitis   . Fluid retention   . GERD (gastroesophageal reflux disease)   . Hiatal hernia   . Hypertension   . Hypokalemia due to loss of potassium 10/21/2015   Overview:  Associated with 3 weeks of diarrhea  And QT prolongation.  . Hypothyroidism   . IBS (irritable bowel syndrome)   . Moderate episode of recurrent major depressive disorder (HCC) 06/03/2004  . Morbid obesity (HCC)   . MRSA (methicillin resistant Staphylococcus aureus) infection 11/2017   left inner thigh abcess  . Neurogenic bladder    has pacemaker  . Neuropathy   . Obesity   . Panic attacks   . Rheumatoid arthritis (HCC)   .  Sleep apnea    STATES SEVERE, CANT TOLERATE MASK- LAST STUDY YEARS AGO    Past Surgical History:  Procedure Laterality Date  . ABDOMINAL HYSTERECTOMY    . CHOLECYSTECTOMY    . DG GREAT TOE RIGHT FOOT  02/23/2018  . EYE SURGERY     bilateral cataract extraction with IOL  . HERNIA REPAIR     ventral hernia with strangulation  . LAPAROSCOPIC GASTRIC BANDING  03/20/07  . TONSILLECTOMY    . TUBAL LIGATION      Family History  Problem Relation Age of Onset  . Heart failure Father   .  Bipolar disorder Father   . Alcohol abuse Father   . Anxiety disorder Father   . Depression Father   . Heart disease Brother   . Heart attack Brother [id number]       MI s/p stents placed  . Anxiety disorder Sister   . Depression Sister   . Anxiety disorder Sister   . Depression Sister   . Bipolar disorder Sister   . Alcohol abuse Sister   . Drug abuse Sister   . Heart attack Brother     Social History:  reports that she quit smoking about 19 years ago. Her smoking use included cigarettes. She has a 54.00 pack-year smoking history. She has never used smokeless tobacco. She reports that she does not drink alcohol or use drugs.  Allergies:  Allergies  Allergen Reactions  . Cephalexin Hives  . Codeine Palpitations, Nausea Only, Nausea And Vomiting, Rash and Shortness Of Breath    "makes heart fly, she gets flushed and passes out"  . Doxycycline Rash  . Propoxyphene Rash and Shortness Of Breath    Increase heart rate  . Sulfa Antibiotics Palpitations, Nausea Only, Shortness Of Breath and Hives    "makes heart fly, she gets flushed and passes out"  . Lovenox [Enoxaparin Sodium] Hives  . Hydrocodone Nausea And Vomiting    Hear racing & breaks out into a cold sweat.  . Meropenem Rash    Erythematous, hot, pruritic rash over arms, chest, back, abdomen, and face occurred at the end of meropenem infusion on 02/22/18    Medications: I have reviewed the patient's current medications.   Results for orders placed or performed during the hospital encounter of 04/06/19 (from the past 48 hour(s))  Lactic acid, plasma     Status: Abnormal   Collection Time: 04/06/19 10:10 AM  Result Value Ref Range   Lactic Acid, Venous 3.9 (HH) 0.5 - 1.9 mmol/L    Comment: CRITICAL RESULT CALLED TO, READ BACK BY AND VERIFIED WITH KIM MAIN AT 1047 04/06/2019.PMF Performed at [facility], [address] [place], [place] [address]   Comprehensive metabolic panel     Status: Abnormal   Collection Time:  04/06/19 10:10 AM  Result Value Ref Range   Sodium 131 (L) 135 - 145 mmol/L   Potassium 3.9 3.5 - 5.1 mmol/L   Chloride 97 (L) 98 - 111 mmol/L   CO2 20 (L) 22 - 32 mmol/L   Glucose, Bld 577 (HH) 70 - 99 mg/dL    Comment: CRITICAL RESULT CALLED TO, READ BACK BY AND VERIFIED WITH KIM MAIN AT 1142 04/06/2019.PMF   BUN 33 (H) 6 - 20 mg/dL   Creatinine, Ser 6.041.32 (H) 0.44 - 1.00 mg/dL   Calcium 8.5 (L) 8.9 - 10.3 mg/dL   Total Protein 6.8 6.5 - 8.1 g/dL   Albumin 2.5 (L) 3.5 - 5.0 g/dL  AST 26 15 - 41 U/L   ALT 16 0 - 44 U/L   Alkaline Phosphatase 51 38 - 126 U/L   Total Bilirubin 0.8 0.3 - 1.2 mg/dL   GFR calc non Af Amer 45 (L) >60 mL/min   GFR calc Af Amer 52 (L) >60 mL/min   Anion gap 14 5 - 15    Comment: Performed at [facility], [address] [place], [place] [address]  CBC WITH DIFFERENTIAL     Status: Abnormal   Collection Time: 04/06/19 10:10 AM  Result Value Ref Range   WBC 5.0 4.0 - 10.5 K/uL   RBC 3.83 (L) 3.87 - 5.11 MIL/uL   Hemoglobin 10.1 (L) 12.0 - 15.0 g/dL   HCT 31.1 (L) 36.0 - 46.0 %   MCV 81.2 80.0 - 100.0 fL   MCH 26.4 26.0 - 34.0 pg   MCHC 32.5 30.0 - 36.0 g/dL   RDW 15.1 11.5 - 15.5 %   Platelets 152 150 - 400 K/uL   nRBC 0.0 0.0 - 0.2 %   Neutrophils Relative % 86 %   Neutro Abs 4.3 1.7 - 7.7 K/uL   Lymphocytes Relative 9 %   Lymphs Abs 0.4 (L) 0.7 - 4.0 K/uL   Monocytes Relative 5 %   Monocytes Absolute 0.3 0.1 - 1.0 K/uL   Eosinophils Relative 0 %   Eosinophils Absolute 0.0 0.0 - 0.5 K/uL   Basophils Relative 0 %   Basophils Absolute 0.0 0.0 - 0.1 K/uL   Immature Granulocytes 0 %   Abs Immature Granulocytes 0.02 0.00 - 0.07 K/uL    Comment: Performed at [facility], [address] [place], [place] [address]  Fibrinogen     Status: Abnormal   Collection Time: 04/06/19 10:10 AM  Result Value Ref Range   Fibrinogen >750 (H) 210 - 475 mg/dL    Comment: Performed at [facility], [address] [place],  [place] [address]  Ferritin (Iron Binding Protein)     Status: Abnormal   Collection Time: 04/06/19 10:10 AM  Result Value Ref Range   Ferritin 1,097 (H) 11 - 307 ng/mL    Comment: Performed at [facility], [address] [place], [place] [address]  Sedimentation rate     Status: Abnormal   Collection Time: 04/06/19 10:10 AM  Result Value Ref Range   Sed Rate 107 (H) 0 - 30 mm/hr    Comment: Performed at [facility], [address] [place], [place] [address]  Fibrin derivatives D-Dimer [facility] only)     Status: Abnormal   Collection Time: 04/06/19 10:10 AM  Result Value Ref Range   Fibrin derivatives D-dimer ([facility]) 1,405.71 (H) 0.00 - 499.00 ng/mL (FEU)    Comment: (NOTE) <> Exclusion of Venous Thromboembolism (VTE) - OUTPATIENT ONLY   (Emergency Department or [place])   0-499 ng/ml (FEU): With a low to intermediate pretest probability                      for VTE this test result excludes the diagnosis                      of VTE.   >499 ng/ml (FEU) : VTE not excluded; additional work up for VTE is                      required. <> Testing on Inpatients and Evaluation of Disseminated Intravascular  Coagulation (DIC) Reference Range:   0-499 ng/ml (FEU) Performed at [facility], [address] [place], [place] [address]   Protime-INR     Status: Abnormal   Collection Time: 04/06/19 10:10 AM  Result Value Ref Range   Prothrombin Time 15.8 (H) 11.4 - 15.2 seconds   INR 1.3 (H) 0.8 - 1.2    Comment: (NOTE) INR goal varies based on device and disease states. Performed at [facility], [address] [place], [place] [address]   APTT     Status: Abnormal   Collection Time: 04/06/19 10:10 AM  Result Value Ref Range   aPTT 38 (H) 24 - 36 seconds    Comment:        IF BASELINE aPTT IS ELEVATED, SUGGEST PATIENT RISK ASSESSMENT BE USED TO DETERMINE APPROPRIATE ANTICOAGULANT THERAPY. Performed at [facility], [address]  [place], [place] [address]   Magnesium     Status: None   Collection Time: 04/06/19 10:10 AM  Result Value Ref Range   Magnesium 1.9 1.7 - 2.4 mg/dL    Comment: Performed at [facility], [address] [place], [place] [address]  Lactic acid, plasma     Status: Abnormal   Collection Time: 04/06/19 12:02 PM  Result Value Ref Range   Lactic Acid, Venous 2.2 (HH) 0.5 - 1.9 mmol/L    Comment: CRITICAL VALUE NOTED. VALUE IS CONSISTENT WITH PREVIOUSLY REPORTED/CALLED VALUE.PMF Performed at [facility], [address] [place], [place] [address]   Lactic acid, plasma     Status: None   Collection Time: 04/06/19  4:14 PM  Result Value Ref Range   Lactic Acid, Venous 1.8 0.5 - 1.9 mmol/L    Comment: Performed at [facility], [address] [place], [place] [address]  Lactic acid, plasma     Status: None   Collection Time: 04/06/19  6:17 PM  Result Value Ref Range   Lactic Acid, Venous 1.6 0.5 - 1.9 mmol/L    Comment: Performed at [facility], [address] [place], [place] [address]  Brain natriuretic peptide     Status: None   Collection Time: 04/06/19  6:17 PM  Result Value Ref Range   B Natriuretic Peptide 42.0 0.0 - 100.0 pg/mL    Comment: Performed at [facility], [address] [place], [place] [address]  Glucose, capillary     Status: Abnormal   Collection Time: 04/06/19  6:38 PM  Result Value Ref Range   Glucose-Capillary 394 (H) 70 - 99 mg/dL    CT Angio Chest PE W and/or Wo Contrast  Result Date: 04/06/2019 CLINICAL DATA:  COVID-19 positive with hypoxemia/shortness of breath EXAM: CT ANGIOGRAPHY CHEST WITH CONTRAST TECHNIQUE: Multidetector CT imaging of the chest was performed using the standard protocol during bolus administration of intravenous contrast. Multiplanar CT image reconstructions and MIPs were obtained to evaluate the vascular anatomy. CONTRAST:  10mL OMNIPAQUE IOHEXOL 350 MG/ML SOLN COMPARISON:  Chest CT July 07, 2012; chest radiograph April 06, 2019. FINDINGS: Cardiovascular: There is no demonstrable pulmonary embolus. There is no thoracic aortic aneurysm or dissection. The visualized great vessels appear unremarkable. There is minimal pericardial fluid, felt to be within physiologic range. There is no pericardial thickening. The main pulmonary outflow tract measures 3.6 cm in diameter, prominent. Mediastinum/Nodes: Thyroid appears unremarkable. There are multiple subcentimeter mediastinal lymph nodes. There is a lymph node to the right of the distal trachea measuring 1.2 x 1.1 cm. There  is a lymph node along the superior aortopulmonary window region measuring 1.3 x 1.0 cm. A lymph node to the right of the upper carina measures 1.3 x 1.2 cm. There is a subcarinal lymph node measuring 1.5 x 1.4 cm. There is a retrocrural lymph node on the left at the gastroesophageal junction level measuring 1.2 x 1.2 cm. No esophageal lesions are appreciable. Lungs/Pleura: There is widespread airspace opacity consistent with multifocal pneumonia. Most of this infiltrate has a ground-glass type appearance, although there are scattered areas of consolidation, particularly in the superior, lateral, and posterior segments of the right lower lobe. There is no appreciable pleural effusion. Upper Abdomen: Spleen measures 16.1 x 14.8 x 9.5 cm with a measured splenic volume 1,132 mL. Gallbladder is absent. Visualized upper abdominal structures appear unremarkable. Musculoskeletal: There are foci of degenerative change in the thoracic spine. There are no blastic or lytic bone lesions. No chest wall lesions evident. Review of the MIP images confirms the above findings. IMPRESSION: 1. No demonstrable pulmonary embolus. No thoracic aortic aneurysm or dissection. 2. Widespread airspace opacity consistent with multifocal pneumonia bilaterally. Areas of consolidation noted in the right lower lobe. Other areas have a more ground-glass type  appearance. Suspect much of this infiltrate is due to atypical organism etiology. 3. Areas of adenopathy, likely secondary to the extensive pneumonitis. 4. Prominence of the main pulmonary outflow tract, a finding indicative of pulmonary arterial hypertension. 5.  Splenomegaly. 6.  Gallbladder absent. Electronically Signed   By: Bretta BangWilliam  Woodruff III M.D.   On: 04/06/2019 13:05   DG Chest Port 1 View  Result Date: 04/06/2019 CLINICAL DATA:  Pt to ED by EMS from home. Pt dx with COVID 3 weeks ago and [facility] and released last Saturday. Pt continues to be SOB. Pt place on non re-breather by EMS. Hx of HTN and diabetes. Former smoker. fever, recent covid, hypoxic EXAM: PORTABLE CHEST 1 VIEW COMPARISON:  Radiograph 03/26/2019 FINDINGS: Stable enlarged cardiac silhouette. There is increased patchy airspace disease in LEFT and RIGHT lung. Airspace disease is most dense in the lateral RIGHT lung. Bibasilar atelectasis. No pneumothorax IMPRESSION: Increasing bilateral patchy airspace disease consistent with viral pneumonia. Electronically Signed   By: Genevive BiStewart  Edmunds M.D.   On: 04/06/2019 10:31    Review of Systems  Unable to perform ROS: Acuity of condition   Blood pressure 108/74, pulse 70, temperature 98.6 F (37 C), temperature source Oral, resp. rate (!) 23, height 5\' 6"  (1.676 m), weight (!) 145.2 kg, last menstrual period 04/20/2001, SpO2 91 %. Physical Exam  Constitutional: She appears well-developed. She appears ill. She appears distressed. Face mask in place.  Obese, appears very uncomfortable  HENT:  Head: Normocephalic and atraumatic.  Right Ear: External ear normal.  Left Ear: External ear normal.  Nose: Nose normal.  Mouth/Throat: Mucous membranes are dry.  Eyes: Pupils are equal, round, and reactive to light. Conjunctivae are normal. No scleral icterus.  Neck: No JVD present. No tracheal deviation present. No thyromegaly present.  Cardiovascular: Normal rate and  regular rhythm.  No murmur heard. Respiratory: Tachypnea noted. She is in respiratory distress. She has no decreased breath sounds. She has no wheezes. She has no rales.  Coarse breath sounds  GI: Soft. Bowel sounds are normal. She exhibits no distension. There is no abdominal tenderness. There is no CVA tenderness.  Protruberant/obese  Genitourinary:    Genitourinary Comments: Pure-wick in place   Musculoskeletal:        General: Edema  present.     Cervical back: Neck supple.  Neurological:  In distress, appears uncomfortable, no overt localizing deficits no further assessment can be made due to acuity of condition  Skin: Skin is warm, dry and intact.  Psychiatric:  Patient to distress to be able to assess.    Assessment/Plan:  Acute hypoxic respiratory failure Late pulmonary phase of COVID-19 disease Bilateral pulmonary infiltrates High flow O2 to maintain oxygen saturations between 88 to 92% She will need anti-inflammatory therapy Dose steroids recommended, she is not tolerating due to severe hyperglycemia Utilize colchicine as anti-inflammatory Ivermectin as anti-inflammatory She is allergic to doxycycline which would be recommended to use however will need to withhold She has issues with QT prolongation will withhold Levaquin for now Supplement Zinc and Thiamine Close monitoring in stepdown/ICU High risk for intubation  Hypercoagulable state No evidence of large PE Cannot exclude microthrombi Heparin infusion Anti-inflammatory therapy as above  Diabetes mellitus type 2 Poor control Avoid corticosteroids for now Subcu insulin Close monitoring  Acute kidney injury Likely ATN Avoid nephrotoxins Volume repletion Follow renal panel Monitor I's and O's   Rheumatoid arthritis Recent Actemra dosing This issue adds complexity to her management  Morbid obesity BMI 52 This issue adds complexity to her management   Best practice: DVT prophylaxis: She will be  on heparin infusion GI prophylaxis: Famotidine  Disposition: Stepdown/ICU unit  Critical care time: 60 minutes   C. Danice Goltz, MD [place] 04/06/2019, 10:22 PM   This note was dictated using voice recognition software/Dragon.  Despite best efforts to proofread, errors can occur which can change the meaning.  Any change was purely unintentional.

## 2019-04-06 NOTE — ED Provider Notes (Signed)
[facility] Emergency Department Provider Note  Time seen: 9:58 AM  I have reviewed the triage vital signs and the nursing notes.   HISTORY  Chief Complaint Shortness of Breath   HPI [name] [name] is a 58 y.o. female with a past medical history of anxiety, diabetes, gastric reflux, hypertension, CHF, obesity, presents to the emergency department for shortness of breath.  According to the patient she was diagnosed with Covid several weeks ago was [facility] and has since been discharged home.  Patient states she does not wear any oxygen at home, but over the past few days has had progressively worsening shortness of breath.  Patient states she continues to have fever as high as 103 yesterday, continues to have frequent cough has mild chest discomfort due to coughing.  Much more short of breath today.  EMS state room air saturation around 70% upon their arrival.  Placed the patient on nonrebreather and brought her to the hospital.  With the nonrebreather EMS states they were able to get sats in the low 90s.  Upon arrival patient is awake alert she is oriented able to answer questions appropriately.  Does appear mildly short of breath, currently on a nonrebreather.  Denies any chest pain at this time.  No abdominal pain.  Patient does have a history of CHF with mild pedal edema at this time.   Past Medical History:  Diagnosis Date  . Abdominal wall hernia 01/29/2013  . Anxiety   . Arthritis    Rheumatoid  . C. difficile colitis   . Chronic diastolic heart failure (HCC)   . Depression   . Diabetes mellitus    states no meds or diet restrictions  at present  . Diastolic CHF (HCC)   . Esophagitis   . Fluid retention   . GERD (gastroesophageal reflux disease)   . Hiatal hernia   . Hypertension   . Hypokalemia due to loss of potassium 10/21/2015   Overview:  Associated with 3 weeks of diarrhea  And QT prolongation.  . Hypothyroidism   . IBS (irritable  bowel syndrome)   . Moderate episode of recurrent major depressive disorder (HCC) 06/03/2004  . Morbid obesity (HCC)   . MRSA (methicillin resistant Staphylococcus aureus) infection 11/2017   left inner thigh abcess  . Neurogenic bladder    has pacemaker  . Neuropathy   . Obesity   . Panic attacks   . Rheumatoid arthritis (HCC)   . Sleep apnea    STATES SEVERE, CANT TOLERATE MASK- LAST STUDY YEARS AGO    Patient Active Problem List   Diagnosis Date Noted  . COVID-19 03/27/2019  . Pneumonia due to COVID-19 virus 03/27/2019  . Abdominal pain 02/12/2019  . AKI (acute kidney injury) (HCC) 02/12/2019  . Cellulitis, abdominal wall 02/04/2019  . Elevated C-reactive protein (CRP) 12/15/2018  . Elevated sed rate 12/15/2018  . Pharmacologic therapy 11/06/2018  . Disorder of skeletal system 11/06/2018  . Problems influencing health status 11/06/2018  . Decubitus ulcer of heel, bilateral 02/21/2018  . Diabetic ulcer of toe of right foot associated with type 2 diabetes mellitus (HCC) 02/14/2018  . Ulcer of left heel and midfoot with fat layer exposed (HCC) 02/14/2018  . MRSA (methicillin resistant Staphylococcus aureus) infection 11/17/2017  . Cellulitis 11/15/2017  . Perineal abscess 11/15/2017  . Subacute vulvitis 11/01/2017  . Chronic cystitis 03/01/2017  . Chronic pain syndrome 03/31/2016  . Insomnia secondary to chronic pain 03/31/2016  . Chronic upper  back pain 12/25/2015  . Chronic hand pain (Bilateral) (L>R) 12/25/2015  . Rheumatoid arthritis (HCC) 12/25/2015  . Osteoarthritis, multiple sites 12/24/2015  . Chronic foot pain (Primary Area of Pain) (Bilateral) (L>R) 12/24/2015  . Chronic elbow pain (Third area of Pain) (Bilateral) (L>R) 12/24/2015  . Chronic shoulder pain (Bilateral) (L>R) 12/24/2015  . Chronic neck pain (Bilateral) (R>L) 12/24/2015  . 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[employer]) 12/23/2015  . Chronic knee pain (Bilateral) (R>L) 12/23/2015  .  Long term current use of opiate analgesic 12/23/2015  . Long term prescription opiate use 12/23/2015  . Opiate use (30 MME/Day) 12/23/2015  . Neurogenic pain 12/23/2015  . Neuropathic pain 12/23/2015  . Diabetic peripheral neuropathy (HCC) 12/23/2015  . Encounter for therapeutic drug level monitoring 12/23/2015  . Encounter for pain management planning 12/23/2015  . GERD (gastroesophageal reflux disease) 11/25/2015  . Neuropathy 11/25/2015  . Diarrhea 10/22/2015  . Hypokalemia 10/21/2015  . Hypomagnesemia 10/21/2015  . QT prolongation 10/21/2015  . 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 Chronic wrist pain (Secondary area of Pain) (Bilateral) (L>R) 03/19/2015  . 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 Female genuine stress incontinence 02/14/2015  . Urge incontinence of urine 02/14/2015  . Obstructive apnea 07/03/2014  . Chronic diastolic CHF (congestive heart failure), NYHA class 3 (HCC) 07/03/2014  . Anxiety 01/03/2014  . Diabetic ulcer of heel (HCC) 01/03/2014  . COPD (chronic obstructive pulmonary disease) (HCC) 01/03/2014  . Bipolar disorder, unspecified (HCC) 01/03/2014  . Diastolic dysfunction 01/03/2014  . Combined fat and carbohydrate induced hyperlipemia 01/03/2014  . Shortness of breath 01/03/2014  . Acute on chronic congestive heart failure (HCC) 01/03/2014  . Incomplete bladder emptying 11/02/2012  . Bladder retention 10/10/2012  . Detrusor muscle hypertonia 10/04/2012  . Obstruction of urinary tract 10/04/2012  . FOM (frequency of micturition) 10/04/2012  . Mixed incontinence 10/04/2012  . Vitamin D insufficiency 04/15/2012  . Borderline personality disorder (HCC) 01/06/2012  . Mixed anxiety depressive disorder 01/06/2012  . History of laparoscopic adjustable gastric banding, 03/20/2007.  Removed 09/19/2011. 08/04/2011  . Nausea & vomiting 08/04/2011  . Hypothyroidism 06/28/2010  . Rheumatoid arthritis flare (HCC) 06/28/2010  . Obesity  03/29/2005  . Essential (primary) hypertension 01/27/2005  . Major depressive disorder, recurrent episode, moderate (HCC) 06/03/2004    Past Surgical History:  Procedure Laterality Date  . ABDOMINAL HYSTERECTOMY    . CHOLECYSTECTOMY    . DG GREAT TOE RIGHT FOOT  02/23/2018  . EYE SURGERY     bilateral cataract extraction with IOL  . HERNIA REPAIR     ventral hernia with strangulation  . LAPAROSCOPIC GASTRIC BANDING  03/20/07  . TONSILLECTOMY    . TUBAL LIGATION      Prior to Admission medications   Medication Sig Start Date End Date Taking? Authorizing Provider  ARIPiprazole (ABILIFY) 5 MG tablet TAKE 1 TABLET BY MOUTH ONCE DAILY. 02/22/19   Clapacs, Jackquline Denmark, MD  atorvastatin (LIPITOR) 80 MG tablet Take 80 mg by mouth daily at 6 PM.  02/09/13   [provider]  busPIRone (BUSPAR) 10 MG tablet TAKE 1 TABLET BY MOUTH TWICE DAILY 02/05/19   Clapacs, Jackquline Denmark, MD  Calcium Carb-Ergocalciferol 250-125 MG-UNIT TABS Take 1 tablet by mouth daily.     [provider]  Cholecalciferol (VITAMIN D3) 125 MCG (5000 UT) CAPS Take 1 capsule (5,000 Units total) by mouth daily with breakfast. Take along with calcium and magnesium. 02/14/19 02/14/20  Delano Metz, MD  FLUoxetine [facility])  20 MG capsule TAKE (1) CAPSULE BY MOUTH ONCE DAILY. 11/30/18   Clapacs, Jackquline DenmarkJohn T, MD  folic acid (FOLVITE) 1 MG tablet Take 1 mg by mouth daily.     [provider]  Insulin Degludec (TRESIBA FLEXTOUCH) 200 UNIT/ML SOPN Inject 65-100 Units into the skin daily.    [provider]  levothyroxine (SYNTHROID, LEVOTHROID) 88 MCG tablet Take 88 mcg by mouth daily before breakfast.    [provider]  lisinopril (PRINIVIL,ZESTRIL) 2.5 MG tablet Take 2.5 mg by mouth daily.    [provider]  metFORMIN (GLUCOPHAGE) 500 MG tablet Take 500 mg by mouth 2 (two) times daily with a meal.     [provider]  methotrexate 50 MG/2ML injection Inject 50 mg/m2 into the muscle  once a week. Friday    [provider]  oxyCODONE (OXY IR/ROXICODONE) 5 MG immediate release tablet Take 1 tablet (5 mg total) by mouth every 6 (six) hours as needed for severe pain. Must last 30 days 03/16/19 04/15/19  Delano MetzNaveira, Francisco, MD  oxyCODONE (OXY IR/ROXICODONE) 5 MG immediate release tablet Take 1 tablet (5 mg total) by mouth every 6 (six) hours as needed for severe pain. Must last 30 days 04/15/19 05/15/19  Delano MetzNaveira, Francisco, MD  pramipexole (MIRAPEX) 0.125 MG tablet Take 0.125 mg by mouth 2 (two) times daily. 03/08/19   [provider]  pregabalin (LYRICA) 150 MG capsule Take 1 capsule (150 mg total) by mouth 3 (three) times daily. 02/14/19 08/13/19  Delano MetzNaveira, Francisco, MD  QUEtiapine (SEROQUEL) 300 MG tablet TAKE ONE TABLET BY MOUTH AT BEDTIME. 02/05/19   Clapacs, Jackquline DenmarkJohn T, MD  VESICARE 5 MG tablet Take 5 mg by mouth daily. 03/16/19   [provider]  zolpidem (AMBIEN) 5 MG tablet TAKE 1 TABLET BY MOUTH EVERYDAY AT BEDTIME 11/30/18   Clapacs, Jackquline DenmarkJohn T, MD    Allergies  Allergen Reactions  . Cephalexin Hives  . Codeine Palpitations, Nausea Only, Nausea And Vomiting, Rash and Shortness Of Breath    "makes heart fly, she gets flushed and passes out"  . Doxycycline Rash  . Propoxyphene Rash and Shortness Of Breath    Increase heart rate  . Sulfa Antibiotics Palpitations, Nausea Only, Shortness Of Breath and Hives    "makes heart fly, she gets flushed and passes out"  . Lovenox [Enoxaparin Sodium] Hives  . Hydrocodone Nausea And Vomiting    Hear racing & breaks out into a cold sweat.  . Meropenem Rash    Erythematous, hot, pruritic rash over arms, chest, back, abdomen, and face occurred at the end of meropenem infusion on 02/22/18    Family History  Problem Relation Age of Onset  . Heart failure Father   . Bipolar disorder Father   . Alcohol abuse Father   . Anxiety disorder Father   . Depression Father   . Heart disease Brother   . Heart attack  Brother [id number]       MI s/p stents placed  . Anxiety disorder Sister   . Depression Sister   . Anxiety disorder Sister   . Depression Sister   . Bipolar disorder Sister   . Alcohol abuse Sister   . Drug abuse Sister   . Heart attack Brother     Social History Social History   Tobacco Use  . Smoking status: Former Smoker    Packs/day: 2.00    Years: 27.00    Pack years: 54.00    Types: Cigarettes    Quit  date: 07/30/1999    Years since quitting: 19.6  . Smokeless tobacco: Never Used  Substance Use Topics  . Alcohol use: No  . Drug use: No    Review of Systems Constitutional: Negative for fever. Cardiovascular: Mild intermittent chest discomfort with coughing Respiratory: Positive for shortness of breath, worse over the past several days.  Positive for cough. Gastrointestinal: Negative for abdominal pain Musculoskeletal: Negative for musculoskeletal complaints Neurological: Negative for headache All other ROS negative  ____________________________________________   PHYSICAL EXAM:  VITAL SIGNS: ED Triage Vitals  Enc Vitals Group     BP      Pulse      Resp      Temp      Temp src      SpO2      Weight      Height      Head Circumference      Peak Flow      Pain Score      Pain Loc      Pain Edu?      Excl. in [place]?    Constitutional: Alert and oriented. Well appearing and in no distress. Eyes: Normal exam ENT      Head: Normocephalic and atraumatic.      Mouth/Throat: Mucous membranes are moist. Cardiovascular: Normal rate, regular rhythm.  Respiratory: Normal respiratory effort without tachypnea nor retractions. Breath sounds are clear Gastrointestinal: Soft and nontender. No distention. Musculoskeletal: Nontender with normal range of motion in all extremities.  Neurologic:  Normal speech and language. No gross focal neurologic deficits  Skin:  Skin is warm, dry and intact.  Psychiatric: Mood and affect are normal.    ____________________________________________    EKG  EKG viewed and interpreted by myself shows a normal sinus rhythm at 77 bpm with a narrow QRS, normal axis, normal intervals, no concerning ST changes.  ____________________________________________    RADIOLOGY  Chest x-ray shows increasing bilateral airspace opacities consistent viral pneumonia  ____________________________________________   INITIAL IMPRESSION / ASSESSMENT AND PLAN / ED COURSE  Pertinent labs & imaging results that were available during my care of the patient were reviewed by me and considered in my medical decision making (see chart for details).   Patient presents to the emergency department for shortness of breath found to be hypoxic around 70% on room air.  I reviewed the patient's chart, she was admitted to the hospital 03/26/2019 for worsening difficulty breathing due to pneumonia which was diagnosed 03/23/2019.  Patient was initially hypoxic to 60% on room air with elevated temperatures.  Was ultimately transferred to [facility].  Patient was discharged 03/31/2019 after finishing 5 days of remdesivir, 5 days of dexamethasone and just finished her outpatient dexamethasone dose.  Currently patient is once again hypoxic on room air.  Denies any chest pain at this time but states intermittent chest pain with cough.  We will check labs, chest x-ray.  We will obtain a CTA of the chest to rule out PE.  Patient will require readmission to the hospital, regardless of her results.  Currently satting in the low to mid 90s on a nonrebreather.  Patient is hypotensive in the 90s, we will dose 1 L of IV fluids and reassess.  We will monitor the patient's fluid status given her history of CHF.  X-ray shows increasing bilateral airspace opacities consistent with viral pneumonia.  However given the patient's hypotension continued fever now lactate almost 4.0 we will start broad-spectrum antibiotics as a precaution to cover for  possible HCAP.  Patient has multiple antibiotic allergies as well as prolonged QT C, patient's QT currently is reassuring.  I discussed with pharmacy and we agreed upon vancomycin and Levaquin which I have ordered for the patient.  I continue to await the majority of the patient's lab results, as well as her CTA.  Patient's lab work is overall consistent with Covid with elevated inflammatory markers as well as glucose.  We will dose IV insulin.  Patient CTA I reviewed myself no obvious signs of PE however the patient has diffuse multifocal opacities throughout the lung fields, I believe the overall picture is consistent with worsening Covid infection.  We will discussed with the hospitalist to admit.  Patient continues to be on a nonrebreather but satting in the mid 90s.  [name] [name] was evaluated in Emergency Department on 04/06/2019 for the symptoms described in the history of present illness. She was evaluated in the context of the global COVID-19 pandemic, which necessitated consideration that the patient might be at risk for infection with the SARS-CoV-2 virus that causes COVID-19. Institutional protocols and algorithms that pertain to the evaluation of patients at risk for COVID-19 are in a state of rapid change based on information released by regulatory bodies including the CDC and federal and state organizations. These policies and algorithms were followed during the patient's care in the ED.  CRITICAL CARE Performed by: Minna Antis   Total critical care time: 45 minutes  Critical care time was exclusive of separately billable procedures and treating other patients.  Critical care was necessary to treat or prevent imminent or life-threatening deterioration.  Critical care was time spent personally by me on the following activities: development of treatment plan with patient and/or surrogate as well as nursing, discussions with consultants, evaluation of patient's response to  treatment, examination of patient, obtaining history from patient or surrogate, ordering and performing treatments and interventions, ordering and review of laboratory studies, ordering and review of radiographic studies, pulse oximetry and re-evaluation of patient's condition.  ____________________________________________   FINAL CLINICAL IMPRESSION(S) / ED DIAGNOSES  Dyspnea Hypoxia COVID-19   Minna Antis, MD 04/06/19 1256

## 2019-04-06 NOTE — Consult Note (Signed)
ANTICOAGULATION CONSULT NOTE - Initial Consult  Pharmacy Consult for Heparin Infusion Indication: Hypercoagable state 2/2 COVID disease  Allergies  Allergen Reactions  . Cephalexin Hives  . Codeine Palpitations, Nausea Only, Nausea And Vomiting, Rash and Shortness Of Breath    "makes heart fly, she gets flushed and passes out"  . Doxycycline Rash  . Propoxyphene Rash and Shortness Of Breath    Increase heart rate  . Sulfa Antibiotics Palpitations, Nausea Only, Shortness Of Breath and Hives    "makes heart fly, she gets flushed and passes out"  . Lovenox [Enoxaparin Sodium] Hives  . Hydrocodone Nausea And Vomiting    Hear racing & breaks out into a cold sweat.  . Meropenem Rash    Erythematous, hot, pruritic rash over arms, chest, back, abdomen, and face occurred at the end of meropenem infusion on 02/22/18    Patient Measurements: Height: 5\' 6"  (167.6 cm) Weight: (!) 320 lb (145.2 kg) IBW/kg (Calculated) : 59.3 Heparin Dosing Weight: 95.4  Vital Signs: Temp: 97.8 F (36.6 C) (12/18 0956) Temp Source: Oral (12/18 0956) BP: 103/72 (12/18 1830) Pulse Rate: 64 (12/18 1830)  Labs: Recent Labs    04/06/19 1010  HGB 10.1*  HCT 31.1*  PLT 152  APTT 38*  LABPROT 15.8*  INR 1.3*  CREATININE 1.32*    Estimated Creatinine Clearance: 69.6 mL/min (A) (by C-G formula based on SCr of 1.32 mg/dL (H)).   Medical History: Past Medical History:  Diagnosis Date  . Abdominal wall hernia 01/29/2013  . Anxiety   . Arthritis    Rheumatoid  . C. difficile colitis   . Chronic diastolic heart failure ([place])   . Depression   . Diabetes mellitus    states no meds or diet restrictions  at present  . Diastolic CHF ([place])   . Esophagitis   . Fluid retention   . GERD (gastroesophageal reflux disease)   . Hiatal hernia   . Hypertension   . Hypokalemia due to loss of potassium 10/21/2015   Overview:  Associated with 3 weeks of diarrhea  And QT prolongation.  . Hypothyroidism   . IBS  (irritable bowel syndrome)   . Moderate episode of recurrent major depressive disorder ([place]) 06/03/2004  . Morbid obesity ([place])   . MRSA (methicillin resistant Staphylococcus aureus) infection 11/2017   left inner thigh abcess  . Neurogenic bladder    has pacemaker  . Neuropathy   . Obesity   . Panic attacks   . Rheumatoid arthritis ([place])   . Sleep apnea    STATES SEVERE, CANT TOLERATE MASK- LAST STUDY YEARS AGO    Medications:  No anticoagulation prior to admission per medication reconcilation  Assessment: 58 y/o F with history of anxiety, diabetes, gastric reflux, hypertension, CHF, obesity who presented to [facility] ED 12/18 with shortness of breath. Previous COVID diagnosis for which she was admitted to [facility] 12/8-12/12. D dimer elevated to 1405. CXR with bilateral patchy infiltration. CTA negative for PE. Pharmacy has been consulted to initiate heparin drip for hypercoagulable state secondary to COVID disease.  Baseline INR 1.2, aPTT 38. Patient anemic with Hgb of 10. Platelets at lower limit of normal at 152.   Goal of Therapy:  Heparin level 0.3-0.7 units/ml Monitor platelets by anticoagulation protocol: Yes   Plan:  -Heparin IV bolus 4000 units x1 followed by continuous infusion at 1500 units/hr -Heparin level 6 hours after initiation of infusion -Daily CBC per protocol  Thomas Resident 04/06/2019,7:08 PM

## 2019-04-06 NOTE — ED Triage Notes (Signed)
Pt to ED by EMS from home. Pt dx with COVID 3 weeks ago and treated at [facility] and released last Saturday. Pt continues to be SOB. Pt place on non re-breather by EMS and Sat at 91-94% at this time.

## 2019-04-06 NOTE — ED Notes (Signed)
MD Patsey Berthold at bedside. Pt placed on high flow O2 by respiratory therapist.

## 2019-04-06 NOTE — ED Notes (Signed)
This RN goes in room and pt satting at 77% on nonrebreather. This RN messages Dr. Blaine Hamper and pulmonologist. Pharmacy called about getting pt inhalers down here stat. See new orders for ABG. Respiratory called for abg and possibly high flow nasal cannula.

## 2019-04-06 NOTE — Progress Notes (Signed)
CODE SEPSIS - PHARMACY COMMUNICATION  **Broad Spectrum Antibiotics should be administered within 1 hour of Sepsis diagnosis**  Time Code Sepsis Called/Page Received: 0156  Antibiotics Ordered: none  Time of 1st antibiotic administration: NA  Additional action taken by pharmacy: spoke with EDP, going to order antibiotics with increase in lactate. Plan to order vanc/levofloxacin given multiple allergies  If necessary, Name of Provider/Nurse Contacted: Dr. Lorelle Formosa ,PharmD Clinical Pharmacist  04/06/2019  11:11 AM

## 2019-04-06 NOTE — H&P (Signed)
History and Physical    [name] [name] MLY:[id number] DOB: May 09, 1960 DOA: 04/06/2019  Referring MD/NP/PA:   PCP: [facility]   Patient coming from:  The patient is coming from home.  At baseline, pt is independent for most of ADL.        Chief Complaint: SOB  HPI: [name] [name] is a 58 y.o. female with medical history significant of hypertension, hyperlipidemia, diabetes mellitus, GERD, hypothyroidism, depression, anxiety, OSA, rheumatoid arthritis, dCHF, C. difficile colitis, hernia, CKD stage III, who presents with shortness of breath.  Patient was recently hospitalized from 12/8-12/12 due to Covid 19 infection. Patient was treated with remdesivir and steroid. Patient states that have she did not need to wear any oxygen at home, but over the past few days she has progressively worsening shortness of breath with cough, some chest pain. Also had fever of 103 and chills. He has some mild diarrhea and nausea, no vomiting or abdominal pain. No symptoms of UTI or unilateral weakness. EMS state room air saturation around 70% upon their arrival and needs nonrebreather.    ED Course: pt was found to have WBC 5.0, lactic acid 3.9, 2.3, INR 1.3, PTT 38, ESR 107, slightly worsening renal function, fibrinogen >750, ferritin 1097, D-dimer 1405, chest x-ray showed increased bilateral patchy infiltration.  Temperature 97.6, soft blood pressure, tachypnea, heart rate 65.  CT angiogram is negative for PE, showed bilateral multifocal infiltration.  Patient is admitted to stepdown as inpatient.  Critical care, Dr. Patsey Berthold was consulted.  Review of Systems:   General: has fevers, chills, no body weight gain, has poor appetite, has fatigue HEENT: no blurry vision, hearing changes or sore throat Respiratory: has dyspnea, coughing, no wheezing CV: no chest pain, no palpitations GI: has nausea, diarrhea, no abdominal pain, vomiting,constipation GU: no dysuria, burning on urination, increased urinary  frequency, hematuria  Ext: no leg edema Neuro: no unilateral weakness, numbness, or tingling, no vision change or hearing loss Skin: no rash, no skin tear. MSK: No muscle spasm, no deformity, no limitation of range of movement in spin Heme: No easy bruising.  Travel history: No recent long distant travel.  Allergy:  Allergies  Allergen Reactions  . Cephalexin Hives  . Codeine Palpitations, Nausea Only, Nausea And Vomiting, Rash and Shortness Of Breath    "makes heart fly, she gets flushed and passes out"  . Doxycycline Rash  . Propoxyphene Rash and Shortness Of Breath    Increase heart rate  . Sulfa Antibiotics Palpitations, Nausea Only, Shortness Of Breath and Hives    "makes heart fly, she gets flushed and passes out"  . Lovenox [Enoxaparin Sodium] Hives  . Hydrocodone Nausea And Vomiting    Hear racing & breaks out into a cold sweat.  . Meropenem Rash    Erythematous, hot, pruritic rash over arms, chest, back, abdomen, and face occurred at the end of meropenem infusion on 02/22/18    Past Medical History:  Diagnosis Date  . Abdominal wall hernia 01/29/2013  . Anxiety   . Arthritis    Rheumatoid  . C. difficile colitis   . Chronic diastolic heart failure ([place])   . Depression   . Diabetes mellitus    states no meds or diet restrictions  at present  . Diastolic CHF ([place])   . Esophagitis   . Fluid retention   . GERD (gastroesophageal reflux disease)   . Hiatal hernia   . Hypertension   . Hypokalemia due to loss of potassium 10/21/2015  Overview:  Associated with 3 weeks of diarrhea  And QT prolongation.  . Hypothyroidism   . IBS (irritable bowel syndrome)   . Moderate episode of recurrent major depressive disorder ([place]) 06/03/2004  . Morbid obesity ([place])   . MRSA (methicillin resistant Staphylococcus aureus) infection 11/2017   left inner thigh abcess  . Neurogenic bladder    has pacemaker  . Neuropathy   . Obesity   . Panic attacks   . Rheumatoid arthritis ([place])     . Sleep apnea    STATES SEVERE, CANT TOLERATE MASK- LAST STUDY YEARS AGO    Past Surgical History:  Procedure Laterality Date  . ABDOMINAL HYSTERECTOMY    . CHOLECYSTECTOMY    . DG GREAT TOE RIGHT FOOT  02/23/2018  . EYE SURGERY     bilateral cataract extraction with IOL  . HERNIA REPAIR     ventral hernia with strangulation  . LAPAROSCOPIC GASTRIC BANDING  03/20/07  . TONSILLECTOMY    . TUBAL LIGATION      Social History:  reports that she quit smoking about 19 years ago. Her smoking use included cigarettes. She has a 54.00 pack-year smoking history. She has never used smokeless tobacco. She reports that she does not drink alcohol or use drugs.  Family History:  Family History  Problem Relation Age of Onset  . Heart failure Father   . Bipolar disorder Father   . Alcohol abuse Father   . Anxiety disorder Father   . Depression Father   . Heart disease Brother   . Heart attack Brother 47       MI s/p stents placed  . Anxiety disorder Sister   . Depression Sister   . Anxiety disorder Sister   . Depression Sister   . Bipolar disorder Sister   . Alcohol abuse Sister   . Drug abuse Sister   . Heart attack Brother      Prior to Admission medications   Medication Sig Start Date End Date Taking? Authorizing Provider  ARIPiprazole (ABILIFY) 5 MG tablet TAKE 1 TABLET BY MOUTH ONCE DAILY. 02/22/19   Clapacs, Madie Reno, MD  atorvastatin (LIPITOR) 80 MG tablet Take 80 mg by mouth daily at 6 PM.  02/09/13   [provider]  busPIRone (BUSPAR) 10 MG tablet TAKE 1 TABLET BY MOUTH TWICE DAILY 02/05/19   Clapacs, Madie Reno, MD  Calcium Carb-Ergocalciferol 250-125 MG-UNIT TABS Take 1 tablet by mouth daily.     [provider]  Cholecalciferol (VITAMIN D3) 125 MCG (5000 UT) CAPS Take 1 capsule (5,000 Units total) by mouth daily with breakfast. Take along with calcium and magnesium. 02/14/19 02/14/20  Milinda Pointer, MD  FLUoxetine (PROZAC) 20 MG capsule TAKE (1) CAPSULE BY  MOUTH ONCE DAILY. 11/30/18   Clapacs, Madie Reno, MD  folic acid (FOLVITE) 1 MG tablet Take 1 mg by mouth daily.     [provider]  Insulin Degludec (TRESIBA FLEXTOUCH) 200 UNIT/ML SOPN Inject 65-100 Units into the skin daily.    [provider]  levothyroxine (SYNTHROID, LEVOTHROID) 88 MCG tablet Take 88 mcg by mouth daily before breakfast.    [provider]  lisinopril (PRINIVIL,ZESTRIL) 2.5 MG tablet Take 2.5 mg by mouth daily.    [provider]  metFORMIN (GLUCOPHAGE) 500 MG tablet Take 500 mg by mouth 2 (two) times daily with a meal.     [provider]  methotrexate 50 MG/2ML injection Inject 50 mg/m2 into the muscle once a week. Friday  [provider]  oxyCODONE (OXY IR/ROXICODONE) 5 MG immediate release tablet Take 1 tablet (5 mg total) by mouth every 6 (six) hours as needed for severe pain. Must last 30 days 03/16/19 04/15/19  Milinda Pointer, MD  oxyCODONE (OXY IR/ROXICODONE) 5 MG immediate release tablet Take 1 tablet (5 mg total) by mouth every 6 (six) hours as needed for severe pain. Must last 30 days 04/15/19 05/15/19  Milinda Pointer, MD  pramipexole (MIRAPEX) 0.125 MG tablet Take 0.125 mg by mouth 2 (two) times daily. 03/08/19   [provider]  pregabalin (LYRICA) 150 MG capsule Take 1 capsule (150 mg total) by mouth 3 (three) times daily. 02/14/19 08/13/19  Milinda Pointer, MD  QUEtiapine (SEROQUEL) 300 MG tablet TAKE ONE TABLET BY MOUTH AT BEDTIME. 02/05/19   Clapacs, Madie Reno, MD  VESICARE 5 MG tablet Take 5 mg by mouth daily. 03/16/19   [provider]  zolpidem (AMBIEN) 5 MG tablet TAKE 1 TABLET BY MOUTH EVERYDAY AT BEDTIME 11/30/18   Clapacs, Madie Reno, MD    Physical Exam: Vitals:   04/06/19 1200 04/06/19 1341 04/06/19 1500 04/06/19 1530  BP: 93/60 96/71 96/68  98/64  Pulse: 64 65 65 66  Resp: (!) 22 (!) 21 17 (!) 22  Temp:      TempSrc:      SpO2: 93% 94% 95% 95%  Weight:      Height:        General: Not in acute distress HEENT:       Eyes: PERRL, EOMI, no scleral icterus.       ENT: No discharge from the ears and nose, no pharynx injection, no tonsillar enlargement.        Neck: No JVD, no bruit, no mass felt. Heme: No neck lymph node enlargement. Cardiac: S1/S2, RRR, No murmurs, No gallops or rubs. Respiratory: has fine crackles bialterally GI: Soft, nondistended, nontender, no rebound pain, no organomegaly, BS present. GU: No hematuria Ext: No pitting leg edema bilaterally. 2+DP/PT pulse bilaterally. Musculoskeletal: No joint deformities, No joint redness or warmth, no limitation of ROM in spin. Skin: No rashes.  Neuro: Alert, oriented X3, cranial nerves II-XII grossly intact, moves all extremities normally.  Psych: Patient is not psychotic, no suicidal or hemocidal ideation.  Labs on Admission: I have personally reviewed following labs and imaging studies  CBC: Recent Labs  Lab 03/31/19 0327 04/01/19 2152 04/06/19 1010  WBC 3.0* 4.5 5.0  NEUTROABS 1.5* 3.3 4.3  HGB 11.0* 12.0 10.1*  HCT 34.7* 35.9* 31.1*  MCV 84.2 78.2* 81.2  PLT 157 160 038   Basic Metabolic Panel: Recent Labs  Lab 03/31/19 0327 04/01/19 2152 04/06/19 1010  NA 142 136 131*  K 3.5 3.7 3.9  CL 105 103 97*  CO2 26 21* 20*  GLUCOSE 168* 258* 577*  BUN 26* 15 33*  CREATININE 0.91 0.89 1.32*  CALCIUM 8.9 8.8* 8.5*   GFR: Estimated Creatinine Clearance: 69.6 mL/min (A) (by C-G formula based on SCr of 1.32 mg/dL (H)). Liver Function Tests: Recent Labs  Lab 03/31/19 0327 04/01/19 2152 04/06/19 1010  AST 20 24 26   ALT 24 26 16   ALKPHOS 52 62 51  BILITOT 0.5 0.9 0.8  PROT 7.0 7.7 6.8  ALBUMIN 3.2* 3.6 2.5*   No results for input(s): LIPASE, AMYLASE in the last 168 hours. No results for input(s): AMMONIA in the last 168 hours. Coagulation Profile: Recent Labs  Lab 04/06/19 1010  INR 1.3*   Cardiac Enzymes: No results for  input(s): CKTOTAL, CKMB, CKMBINDEX, TROPONINI in  the last 168 hours. BNP (last 3 results) No results for input(s): PROBNP in the last 8760 hours. HbA1C: No results for input(s): HGBA1C in the last 72 hours. CBG: Recent Labs  Lab 03/30/19 1952 03/31/19 0751 03/31/19 1132  GLUCAP 364* 120* 168*   Lipid Profile: No results for input(s): CHOL, HDL, LDLCALC, TRIG, CHOLHDL, LDLDIRECT in the last 72 hours. Thyroid Function Tests: No results for input(s): TSH, T4TOTAL, FREET4, T3FREE, THYROIDAB in the last 72 hours. Anemia Panel: Recent Labs    04/06/19 1010  FERRITIN 1,097*   Urine analysis:    Component Value Date/Time   COLORURINE YELLOW (A) 03/27/2019 0002   APPEARANCEUR HAZY (A) 03/27/2019 0002   APPEARANCEUR Clear 11/30/2013 1010   LABSPEC 1.016 03/27/2019 0002   LABSPEC 1.015 11/30/2013 1010   PHURINE 5.0 03/27/2019 0002   GLUCOSEU >=500 (A) 03/27/2019 0002   GLUCOSEU >=500 11/30/2013 1010   HGBUR NEGATIVE 03/27/2019 0002   BILIRUBINUR NEGATIVE 03/27/2019 0002   BILIRUBINUR Negative 11/30/2013 1010   KETONESUR NEGATIVE 03/27/2019 0002   PROTEINUR 30 (A) 03/27/2019 0002   UROBILINOGEN 0.2 03/14/2007 1000   NITRITE NEGATIVE 03/27/2019 0002   LEUKOCYTESUR NEGATIVE 03/27/2019 0002   LEUKOCYTESUR Negative 11/30/2013 1010   Sepsis Labs: @LABRCNTIP (procalcitonin:4,lacticidven:4) ) Recent Results (from the past 240 hour(s))  Blood culture (routine x 2)     Status: None   Collection Time: 04/01/19  9:52 PM   Specimen: BLOOD  Result Value Ref Range Status   Specimen Description BLOOD RIGHT ANTECUBITAL  Final   Special Requests   Final    BOTTLES DRAWN AEROBIC AND ANAEROBIC Blood Culture results may not be optimal due to an inadequate volume of blood received in culture bottles   Culture   Final    NO GROWTH 5 DAYS Performed at [facility], [address] [place], [place] [address]    Report Status 04/06/2019 FINAL  Final  Blood culture (routine x 2)     Status: None   Collection Time: 04/01/19  9:52 PM    Specimen: BLOOD  Result Value Ref Range Status   Specimen Description BLOOD LEFT ANTECUBITAL  Final   Special Requests   Final    BOTTLES DRAWN AEROBIC AND ANAEROBIC Blood Culture adequate volume   Culture   Final    NO GROWTH 5 DAYS Performed at [facility], [address] [place],  [address]    Report Status 04/06/2019 FINAL  Final     Radiological Exams on Admission: CT Angio Chest PE W and/or Wo Contrast  Result Date: 04/06/2019 CLINICAL DATA:  COVID-19 positive with hypoxemia/shortness of breath EXAM: CT ANGIOGRAPHY CHEST WITH CONTRAST TECHNIQUE: Multidetector CT imaging of the chest was performed using the standard protocol during bolus administration of intravenous contrast. Multiplanar CT image reconstructions and MIPs were obtained to evaluate the vascular anatomy. CONTRAST:  80m OMNIPAQUE IOHEXOL 350 MG/ML SOLN COMPARISON:  Chest CT July 07, 2012; chest radiograph April 06, 2019. FINDINGS: Cardiovascular: There is no demonstrable pulmonary embolus. There is no thoracic aortic aneurysm or dissection. The visualized great vessels appear unremarkable. There is minimal pericardial fluid, felt to be within physiologic range. There is no pericardial thickening. The main pulmonary outflow tract measures 3.6 cm in diameter, prominent. Mediastinum/Nodes: Thyroid appears unremarkable. There are multiple subcentimeter mediastinal lymph nodes. There is a lymph node to the right of the distal trachea measuring 1.2 x 1.1 cm. There is a lymph node along the superior  aortopulmonary window region measuring 1.3 x 1.0 cm. A lymph node to the right of the upper carina measures 1.3 x 1.2 cm. There is a subcarinal lymph node measuring 1.5 x 1.4 cm. There is a retrocrural lymph node on the left at the gastroesophageal junction level measuring 1.2 x 1.2 cm. No esophageal lesions are appreciable. Lungs/Pleura: There is widespread airspace opacity consistent with multifocal pneumonia. Most  of this infiltrate has a ground-glass type appearance, although there are scattered areas of consolidation, particularly in the superior, lateral, and posterior segments of the right lower lobe. There is no appreciable pleural effusion. Upper Abdomen: Spleen measures 16.1 x 14.8 x 9.5 cm with a measured splenic volume 1,132 mL. Gallbladder is absent. Visualized upper abdominal structures appear unremarkable. Musculoskeletal: There are foci of degenerative change in the thoracic spine. There are no blastic or lytic bone lesions. No chest wall lesions evident. Review of the MIP images confirms the above findings. IMPRESSION: 1. No demonstrable pulmonary embolus. No thoracic aortic aneurysm or dissection. 2. Widespread airspace opacity consistent with multifocal pneumonia bilaterally. Areas of consolidation noted in the right lower lobe. Other areas have a more ground-glass type appearance. Suspect much of this infiltrate is due to atypical organism etiology. 3. Areas of adenopathy, likely secondary to the extensive pneumonitis. 4. Prominence of the main pulmonary outflow tract, a finding indicative of pulmonary arterial hypertension. 5.  Splenomegaly. 6.  Gallbladder absent. Electronically Signed   By: Lowella Grip III M.D.   On: 04/06/2019 13:05   DG Chest Port 1 View  Result Date: 04/06/2019 CLINICAL DATA:  Pt to ED by EMS from home. Pt dx with COVID 3 weeks ago and treated at [facility] and released last Saturday. Pt continues to be SOB. Pt place on non re-breather by EMS. Hx of HTN and diabetes. Former smoker. fever, recent covid, hypoxic EXAM: PORTABLE CHEST 1 VIEW COMPARISON:  Radiograph 03/26/2019 FINDINGS: Stable enlarged cardiac silhouette. There is increased patchy airspace disease in LEFT and RIGHT lung. Airspace disease is most dense in the lateral RIGHT lung. Bibasilar atelectasis. No pneumothorax IMPRESSION: Increasing bilateral patchy airspace disease consistent with viral pneumonia.  Electronically Signed   By: Suzy Bouchard M.D.   On: 04/06/2019 10:31     EKG: Independently reviewed.  Sinus rhythm, QTC 441, early R wave progression, nonspecific T wave change  Assessment/Plan Principal Problem:   Acute respiratory disease due to COVID-19 virus Active Problems:   Chronic diastolic CHF (congestive heart failure) (HCC)   Rheumatoid arthritis   Type II diabetes mellitus with renal manifestations (HCC)   Sepsis (HCC)   CKD (chronic kidney disease), stage III   HLD (hyperlipidemia)   Acute respiratory disease due to COVID-19 virus with hypoxia: Patient was recently treated with remdesivir and Solu-Medrol.  Some inflammation marker still elevated.  Chest x-ray showed worsening bilateral infiltration. EMS state room air saturation around 70% upon their arrival and needs nonrebreather now.  Dr. Patsey Berthold of critical care was consulted.  -Will admit to SUD as inpt -check Covid 19 related inflammatory marker -check Flu pCR -Abx: Levaquin (patient received 1 dose of vancomycin in ED) -Start Solu-Medrol 60 mg twice daily -Bronchodilators -Critical care, Dr. Patsey Berthold was consulted.  Sepsis due to COVID-19 infection: Patient has a blood pressure 89/61 initially, tachypnea, meet criteria for sepsis.  Lactic acid is elevated 3.9, 2.2.  -will get Procalcitonin and trend lactic acid levels per sepsis protocol. -IVF: 1L of NS bolus in ED, followed by 75 cc/h  -  f/u blood culture  -on levaquine  Chronic diastolic CHF (congestive heart failure) ([place]): 2D echo on 01/06/2014 showed EF of 50%.  No leg edema.  Does not seem to have CHF exacerbation. -Follow-up of BMP  Rheumatoid arthritis: -Patient is on weekly methotrexate injection  Type II diabetes mellitus with renal manifestations ([place]): Last A1c 10.7 on 03/27/19 , poorly controled. Patient is taking Tresiba (66-100 unit daily) and Metformin at home -will give Tresiba 66 units daily   -SSI  CKD (chronic kidney disease),  stage IIIa: slightly worsening.  Baseline creatinine 1.0.  His creatinine is 1.32, BUN 32. -Follow-up of BMP -Hold lisinopril  HTN: -hold lisinopril due to softer blood pressure and worsening renal function  HLD (hyperlipidemia): -lipitor  Inpatient status:  # Patient requires inpatient status due to high intensity of service, high risk for further deterioration and high frequency of surveillance required.  I certify that at the point of admission it is my clinical judgment that the patient will require inpatient hospital care spanning beyond 2 midnights from the point of admission.  . This patient has multiple chronic comorbidities including hypertension, hyperlipidemia, diabetes mellitus, GERD, hypothyroidism, depression, anxiety, OSA, rheumatoid arthritis, dCHF, C. difficile colitis, hernia, CKD stage III . Now patient has presenting with Acute respiratory disease due to COVID-19 virus with hypoxia, worsening renal function . The worrisome physical exam findings include fine crackles on auscultation . The initial radiographic and laboratory data are worrisome because of worsening renal function, elevated lactic acid . Current medical needs: please see my assessment and plan . Predictability of an adverse outcome (risk): Patient's multiple comorbidities, now presents with acute respiratory disease due to COVID-19 virus with hypoxia, needing nonrebreather, Worsening renal function.  Her presentation is highly complicated.  Patient is at high risk of deteriorating.  Will need to be treated in hospital for at least 2 days     DVT ppx: SQ Heparin   Code Status: Full code Family Communication: None at bed side.      Disposition Plan:  Anticipate discharge back to previous home environment Consults called:  Dr. Patsey Berthold of critical care Admission status:   SDU/inpation       Date of Service 04/06/2019    [facility]   If 7PM-7AM, please contact  night-coverage [email or web address] Password [email or web address] 04/06/2019, 5:36 PM

## 2019-04-07 ENCOUNTER — Inpatient Hospital Stay: Payer: Medicaid Other

## 2019-04-07 DIAGNOSIS — I5032 Chronic diastolic (congestive) heart failure: Secondary | ICD-10-CM

## 2019-04-07 LAB — PROCALCITONIN: Procalcitonin: 0.12 ng/mL

## 2019-04-07 LAB — CREATININE, SERUM
Creatinine, Ser: 1.05 mg/dL — ABNORMAL HIGH (ref 0.44–1.00)
GFR calc Af Amer: >60 mL/min (ref >60)
GFR calc non Af Amer: 59 mL/min — ABNORMAL LOW (ref >60)

## 2019-04-07 LAB — COMPREHENSIVE METABOLIC PANEL
ALT: 17 U/L (ref 0–44)
AST: 32 U/L (ref 15–41)
Albumin: 2.4 g/dL — ABNORMAL LOW (ref 3.5–5.0)
Alkaline Phosphatase: 50 U/L (ref 38–126)
Anion gap: 11 (ref 5–15)
BUN: 31 mg/dL — ABNORMAL HIGH (ref 6–20)
CO2: 21 mmol/L — ABNORMAL LOW (ref 22–32)
Calcium: 8.6 mg/dL — ABNORMAL LOW (ref 8.9–10.3)
Chloride: 103 mmol/L (ref 98–111)
Creatinine, Ser: 1.09 mg/dL — ABNORMAL HIGH (ref 0.44–1.00)
GFR calc Af Amer: >60 mL/min (ref >60)
GFR calc non Af Amer: 56 mL/min — ABNORMAL LOW (ref >60)
Glucose, Bld: 364 mg/dL — ABNORMAL HIGH (ref 70–99)
Potassium: 4.5 mmol/L (ref 3.5–5.1)
Sodium: 135 mmol/L (ref 135–145)
Total Bilirubin: 0.6 mg/dL (ref 0.3–1.2)
Total Protein: 6.8 g/dL (ref 6.5–8.1)

## 2019-04-07 LAB — CBC WITH DIFFERENTIAL/PLATELET
Abs Immature Granulocytes: 0.03 10*3/uL (ref 0.00–0.07)
Basophils Absolute: 0 10*3/uL (ref 0.0–0.1)
Basophils Relative: 0 %
Eosinophils Absolute: 0 10*3/uL (ref 0.0–0.5)
Eosinophils Relative: 0 %
HCT: 29.7 % — ABNORMAL LOW (ref 36.0–46.0)
Hemoglobin: 10 g/dL — ABNORMAL LOW (ref 12.0–15.0)
Immature Granulocytes: 1 %
Lymphocytes Relative: 10 %
Lymphs Abs: 0.6 10*3/uL — ABNORMAL LOW (ref 0.7–4.0)
MCH: 26 pg (ref 26.0–34.0)
MCHC: 33.7 g/dL (ref 30.0–36.0)
MCV: 77.1 fL — ABNORMAL LOW (ref 80.0–100.0)
Monocytes Absolute: 0.2 10*3/uL (ref 0.1–1.0)
Monocytes Relative: 3 %
Neutro Abs: 4.9 10*3/uL (ref 1.7–7.7)
Neutrophils Relative %: 86 %
Platelets: 175 10*3/uL (ref 150–400)
RBC: 3.85 MIL/uL — ABNORMAL LOW (ref 3.87–5.11)
RDW: 15.3 % (ref 11.5–15.5)
WBC: 5.6 10*3/uL (ref 4.0–10.5)
nRBC: 0 % (ref 0.0–0.2)

## 2019-04-07 LAB — MAGNESIUM: Magnesium: 1.4 mg/dL — ABNORMAL LOW (ref 1.7–2.4)

## 2019-04-07 LAB — TYPE AND SCREEN
ABO/RH(D): A POS
Antibody Screen: NEGATIVE

## 2019-04-07 LAB — GLUCOSE, CAPILLARY
Glucose-Capillary: 350 mg/dL — ABNORMAL HIGH (ref 70–99)
Glucose-Capillary: 249 mg/dL — ABNORMAL HIGH (ref 70–99)
Glucose-Capillary: 174 mg/dL — ABNORMAL HIGH (ref 70–99)

## 2019-04-07 LAB — LACTATE DEHYDROGENASE: LDH: 343 U/L — ABNORMAL HIGH (ref 98–192)

## 2019-04-07 LAB — MRSA PCR SCREENING: MRSA by PCR: POSITIVE — AB

## 2019-04-07 LAB — TROPONIN I (HIGH SENSITIVITY)
Troponin I (High Sensitivity): 3 ng/L (ref <18)
Troponin I (High Sensitivity): 3 ng/L (ref <18)

## 2019-04-07 LAB — HEPARIN LEVEL (UNFRACTIONATED)
Heparin Unfractionated: 0.67 IU/mL (ref 0.30–0.70)
Heparin Unfractionated: 0.5 IU/mL (ref 0.30–0.70)

## 2019-04-07 LAB — BRAIN NATRIURETIC PEPTIDE: B Natriuretic Peptide: 21 pg/mL (ref 0.0–100.0)

## 2019-04-07 LAB — HEPATITIS B SURFACE ANTIGEN: Hepatitis B Surface Ag: NONREACTIVE

## 2019-04-07 LAB — C-REACTIVE PROTEIN: CRP: 14.1 mg/dL — ABNORMAL HIGH (ref <1.0)

## 2019-04-07 MED ORDER — ZOLPIDEM TARTRATE 5 MG PO TABS
5.0000 mg | ORAL_TABLET | Freq: Every day | ORAL | Status: DC
  Administered 2019-04-09 – 2019-05-03 (×25): 5 mg via ORAL
  Filled 2019-04-07 (×27): qty 1

## 2019-04-07 MED ORDER — LEVOTHYROXINE SODIUM 88 MCG PO TABS
88.0000 ug | ORAL_TABLET | Freq: Every day | ORAL | Status: DC
  Administered 2019-04-07 – 2019-05-04 (×28): 88 ug via ORAL
  Filled 2019-04-07 (×28): qty 1

## 2019-04-07 MED ORDER — CHLORHEXIDINE GLUCONATE CLOTH 2 % EX PADS
6.0000 | MEDICATED_PAD | Freq: Every day | CUTANEOUS | Status: DC
  Administered 2019-04-07 – 2019-05-04 (×22): 6 via TOPICAL

## 2019-04-07 MED ORDER — ARIPIPRAZOLE 5 MG PO TABS
5.0000 mg | ORAL_TABLET | Freq: Every day | ORAL | Status: DC
  Administered 2019-04-07 – 2019-04-08 (×2): 5 mg via ORAL
  Filled 2019-04-07 (×3): qty 1

## 2019-04-07 MED ORDER — OXYCODONE HCL 5 MG PO TABS
5.0000 mg | ORAL_TABLET | Freq: Four times a day (QID) | ORAL | Status: DC | PRN
  Administered 2019-04-07 – 2019-04-28 (×19): 5 mg via ORAL
  Filled 2019-04-07 (×21): qty 1

## 2019-04-07 MED ORDER — INSULIN GLARGINE 100 UNIT/ML ~~LOC~~ SOLN
65.0000 [IU] | Freq: Every day | SUBCUTANEOUS | Status: DC
  Administered 2019-04-07 – 2019-04-27 (×20): 65 [IU] via SUBCUTANEOUS
  Filled 2019-04-07 (×22): qty 0.65

## 2019-04-07 MED ORDER — INSULIN DEGLUDEC 200 UNIT/ML ~~LOC~~ SOPN: 65.0000 [IU] | PEN_INJECTOR | Freq: Every day | SUBCUTANEOUS | Status: DC

## 2019-04-07 MED ORDER — FLUOXETINE HCL 20 MG PO CAPS
20.0000 mg | ORAL_CAPSULE | Freq: Every day | ORAL | Status: DC
  Administered 2019-04-07 – 2019-05-04 (×28): 20 mg via ORAL
  Filled 2019-04-07 (×29): qty 1

## 2019-04-07 MED ORDER — IVERMECTIN 3 MG PO TABS
200.0000 ug/kg | ORAL_TABLET | Freq: Once | ORAL | Status: AC
  Administered 2019-04-07: 28500 ug via ORAL
  Filled 2019-04-07 (×2): qty 10

## 2019-04-07 MED ORDER — FUROSEMIDE 10 MG/ML IJ SOLN
20.0000 mg | Freq: Once | INTRAMUSCULAR | Status: AC
  Administered 2019-04-07: 20 mg via INTRAVENOUS
  Filled 2019-04-07: qty 2

## 2019-04-07 MED ORDER — FOLIC ACID 1 MG PO TABS
1.0000 mg | ORAL_TABLET | Freq: Every day | ORAL | Status: DC
  Administered 2019-04-07 – 2019-05-04 (×28): 1 mg via ORAL
  Filled 2019-04-07 (×28): qty 1

## 2019-04-07 MED ORDER — QUETIAPINE FUMARATE 200 MG PO TABS
300.0000 mg | ORAL_TABLET | Freq: Every day | ORAL | Status: DC
  Administered 2019-04-07: 300 mg via ORAL
  Filled 2019-04-07: qty 1

## 2019-04-07 MED ORDER — VITAMIN D 25 MCG (1000 UNIT) PO TABS
1000.0000 [IU] | ORAL_TABLET | Freq: Every day | ORAL | Status: DC
  Administered 2019-04-07 – 2019-05-04 (×28): 1000 [IU] via ORAL
  Filled 2019-04-07 (×28): qty 1

## 2019-04-07 MED ORDER — CALCIUM CARBONATE-VITAMIN D 500-200 MG-UNIT PO TABS
1.0000 | ORAL_TABLET | Freq: Every day | ORAL | Status: DC
  Administered 2019-04-07 – 2019-05-02 (×26): 1 via ORAL
  Filled 2019-04-07 (×30): qty 1

## 2019-04-07 MED ORDER — DARIFENACIN HYDROBROMIDE ER 7.5 MG PO TB24
7.5000 mg | ORAL_TABLET | Freq: Every day | ORAL | Status: DC
  Administered 2019-04-07 – 2019-05-04 (×28): 7.5 mg via ORAL
  Filled 2019-04-07 (×30): qty 1

## 2019-04-07 MED ORDER — COLCHICINE 0.6 MG PO TABS
0.6000 mg | ORAL_TABLET | Freq: Every day | ORAL | Status: DC
  Administered 2019-04-07 – 2019-04-20 (×14): 0.6 mg via ORAL
  Filled 2019-04-07 (×15): qty 1

## 2019-04-07 MED ORDER — ATORVASTATIN CALCIUM 20 MG PO TABS: 80.0000 mg | ORAL_TABLET | Freq: Every day | ORAL | Status: DC

## 2019-04-07 MED ORDER — PREGABALIN 75 MG PO CAPS
150.0000 mg | ORAL_CAPSULE | Freq: Three times a day (TID) | ORAL | Status: DC
  Administered 2019-04-07 – 2019-05-04 (×79): 150 mg via ORAL
  Filled 2019-04-07 (×79): qty 2

## 2019-04-07 MED ORDER — BUSPIRONE HCL 10 MG PO TABS
10.0000 mg | ORAL_TABLET | Freq: Two times a day (BID) | ORAL | Status: DC
  Administered 2019-04-07 – 2019-05-04 (×53): 10 mg via ORAL
  Filled 2019-04-07 (×33): qty 1
  Filled 2019-04-07: qty 2
  Filled 2019-04-07 (×22): qty 1

## 2019-04-07 MED ORDER — MAGNESIUM SULFATE 2 GM/50ML IV SOLN
2.0000 g | Freq: Once | INTRAVENOUS | Status: AC
  Administered 2019-04-07: 2 g via INTRAVENOUS
  Filled 2019-04-07: qty 50

## 2019-04-07 MED ORDER — PRAMIPEXOLE DIHYDROCHLORIDE 0.125 MG PO TABS
0.1250 mg | ORAL_TABLET | Freq: Two times a day (BID) | ORAL | Status: DC
  Administered 2019-04-07 – 2019-05-04 (×49): 0.125 mg via ORAL
  Filled 2019-04-07: qty 1
  Filled 2019-04-07 (×4): qty 0.5
  Filled 2019-04-07: qty 1
  Filled 2019-04-07: qty 0.5
  Filled 2019-04-07: qty 1
  Filled 2019-04-07: qty 0.5
  Filled 2019-04-07: qty 1
  Filled 2019-04-07: qty 0.5
  Filled 2019-04-07 (×4): qty 1
  Filled 2019-04-07 (×4): qty 0.5
  Filled 2019-04-07: qty 1
  Filled 2019-04-07: qty 0.5
  Filled 2019-04-07 (×2): qty 1
  Filled 2019-04-07 (×3): qty 0.5
  Filled 2019-04-07: qty 1
  Filled 2019-04-07 (×4): qty 0.5
  Filled 2019-04-07: qty 1
  Filled 2019-04-07 (×3): qty 0.5
  Filled 2019-04-07: qty 1
  Filled 2019-04-07: qty 0.5
  Filled 2019-04-07: qty 1
  Filled 2019-04-07: qty 0.5
  Filled 2019-04-07: qty 1
  Filled 2019-04-07 (×8): qty 0.5
  Filled 2019-04-07 (×3): qty 1
  Filled 2019-04-07 (×2): qty 0.5
  Filled 2019-04-07: qty 1
  Filled 2019-04-07 (×3): qty 0.5
  Filled 2019-04-07: qty 1
  Filled 2019-04-07 (×4): qty 0.5
  Filled 2019-04-07 (×3): qty 1
  Filled 2019-04-07: qty 0.5
  Filled 2019-04-07: qty 1

## 2019-04-07 MED ORDER — HEPARIN SODIUM (PORCINE) 5000 UNIT/ML IJ SOLN
5000.0000 [IU] | Freq: Three times a day (TID) | INTRAMUSCULAR | Status: DC
  Administered 2019-04-07: 5000 [IU] via SUBCUTANEOUS
  Filled 2019-04-07: qty 1

## 2019-04-07 NOTE — Consult Note (Signed)
ANTICOAGULATION CONSULT NOTE - Initial Consult  Pharmacy Consult for Heparin Infusion Indication: Hypercoagable state 2/2 COVID disease  Allergies  Allergen Reactions  . Cephalexin Hives  . Codeine Palpitations, Nausea Only, Nausea And Vomiting, Rash and Shortness Of Breath    "makes heart fly, she gets flushed and passes out"  . Doxycycline Rash  . Propoxyphene Rash and Shortness Of Breath    Increase heart rate  . Sulfa Antibiotics Palpitations, Nausea Only, Shortness Of Breath and Hives    "makes heart fly, she gets flushed and passes out"  . Lovenox [Enoxaparin Sodium] Hives  . Hydrocodone Nausea And Vomiting    Hear racing & breaks out into a cold sweat.  . Meropenem Rash    Erythematous, hot, pruritic rash over arms, chest, back, abdomen, and face occurred at the end of meropenem infusion on 02/22/18    Patient Measurements: Height: 5\' 6"  (167.6 cm) Weight: (!) 320 lb (145.2 kg) IBW/kg (Calculated) : 59.3 Heparin Dosing Weight: 95.4  Vital Signs: Temp: 98.4 F (36.9 C) (12/19 0005) Temp Source: Oral (12/19 0005) BP: 118/96 (12/19 1032) Pulse Rate: 63 (12/19 1032)  Labs: Recent Labs    04/06/19 1010 04/07/19 0243 04/07/19 0422 04/07/19 0613 04/07/19 0837  HGB 10.1*  --  10.0*  --   --   HCT 31.1*  --  29.7*  --   --   PLT 152  --  175  --   --   APTT 38*  --   --   --   --   LABPROT 15.8*  --   --   --   --   INR 1.3*  --   --   --   --   HEPARINUNFRC  --  0.67  --   --  0.50  CREATININE 1.32*  --  1.09* 1.05*  --   TROPONINIHS  --   --   --  3 3    Estimated Creatinine Clearance: 87.4 mL/min (A) (by C-G formula based on SCr of 1.05 mg/dL (H)).   Medical History: Past Medical History:  Diagnosis Date  . Abdominal wall hernia 01/29/2013  . Anxiety   . Arthritis    Rheumatoid  . C. difficile colitis   . Chronic diastolic heart failure (HCC)   . Depression   . Diabetes mellitus    states no meds or diet restrictions  at present  . Diastolic CHF  (HCC)   . Esophagitis   . Fluid retention   . GERD (gastroesophageal reflux disease)   . Hiatal hernia   . Hypertension   . Hypokalemia due to loss of potassium 10/21/2015   Overview:  Associated with 3 weeks of diarrhea  And QT prolongation.  . Hypothyroidism   . IBS (irritable bowel syndrome)   . Moderate episode of recurrent major depressive disorder (HCC) 06/03/2004  . Morbid obesity (HCC)   . MRSA (methicillin resistant Staphylococcus aureus) infection 11/2017   left inner thigh abcess  . Neurogenic bladder    has pacemaker  . Neuropathy   . Obesity   . Panic attacks   . Rheumatoid arthritis (HCC)   . Sleep apnea    STATES SEVERE, CANT TOLERATE MASK- LAST STUDY YEARS AGO    Medications:  No anticoagulation prior to admission per medication reconcilation  Assessment: 58 y/o F with history of anxiety, diabetes, gastric reflux, hypertension, CHF, obesity who presented to [facility] ED 12/18 with shortness of breath. Previous COVID diagnosis for which she was admitted  to [facility] 12/8-12/12. D dimer elevated to 1405. CXR with bilateral patchy infiltration. CTA negative for PE. Pharmacy has been consulted to initiate heparin drip for hypercoagulable state secondary to COVID disease.  Heparin infusing at 1500 units/hr.   Goal of Therapy:  Heparin level 0.3-0.7 units/ml Monitor platelets by anticoagulation protocol: Yes   Plan:  Heparin level remains in range. Continue heparin at 1500 units/hr. Will obtain next anti-Xa level and CBC with am labs.   Pharmacy will continue to monitor and adjust per consult.   MLS 04/07/2019,10:50 AM

## 2019-04-07 NOTE — ED Notes (Signed)
Pt reports feeling, "really bad." Pt reports non-productive cough and intermittent abdominal pain

## 2019-04-07 NOTE — Progress Notes (Signed)
PHARMACY - PHYSICIAN COMMUNICATION CRITICAL VALUE ALERT - BLOOD CULTURE IDENTIFICATION (BCID)  [name] [name] is an 58 y.o. female who presented to [facility] on 04/06/2019 with a chief complaint of acute hypoxic respiratory failure with late pulmonary phase of COVID-19 disease  Assessment:  1/4 bottles GPC in clusters (skin flora contaminant?)  Name of physician (or Provider) Contacted: Dr. Loleta Books  Current antibiotics: None  Changes to prescribed antibiotics recommended:  None   [place] Resident 04/07/2019  11:13 AM

## 2019-04-07 NOTE — ED Notes (Signed)
Report from [facility], at this time.

## 2019-04-07 NOTE — ED Notes (Signed)
Pt brief removed- clean chux applied and external catheter adjusted. Pt on phone with contact. New heparin bag started, see emar.

## 2019-04-07 NOTE — Progress Notes (Addendum)
Follow up - Critical Care Medicine Note  Patient Details:    [name] [name] is an 58 y.o. female former smoker with a history of diabetes mellitus, morbid obesity and rheumatoid arthritis diagnosed with COVID-19 on December 4.  Admitted to [facility] campus from 8 December through 12 December received 5 days of remdesivir.  Also received dexamethasone.  She has been found to be on the late pulmonary phase of COVID-19 infection marked by intense inflammatory response.  Lines, Airways, Drains: External Urinary Catheter (Active)  Collection Container Dedicated Suction Canister 04/07/19 1400  Site Assessment Clean;Intact 04/07/19 1400    Anti-infectives:  Anti-infectives (From admission, onward)   Start     Dose/Rate Route Frequency Ordered Stop   04/07/19 1545  ivermectin (STROMECTOL) tablet 28,500 mcg     200 mcg/kg  145.2 kg Oral  Once 04/07/19 1540     04/06/19 2300  ivermectin (STROMECTOL) tablet 18,000 mcg     200 mcg/kg  93.7 kg (Adjusted) Oral  Once 04/06/19 2008 04/06/19 2356   04/06/19 1115  levofloxacin (LEVAQUIN) IVPB 750 mg     750 mg 100 mL/hr over 90 Minutes Intravenous  Once 04/06/19 1108 04/06/19 1346   04/06/19 1115  vancomycin (VANCOCIN) IVPB 1000 mg/200 mL premix     1,000 mg 200 mL/hr over 60 Minutes Intravenous  Once 04/06/19 1108 04/06/19 1550      Microbiology: Results for orders placed or performed during the hospital encounter of 04/06/19  Blood Culture (routine x 2)     Status: None (Preliminary result)   Collection Time: 04/06/19 10:10 AM   Specimen: BLOOD  Result Value Ref Range Status   Specimen Description BLOOD RAC  Final   Special Requests   Final    BOTTLES DRAWN AEROBIC AND ANAEROBIC Blood Culture adequate volume   Culture  Setup Time   Final    GRAM POSITIVE COCCI IN CLUSTERS AEROBIC BOTTLE ONLY CRITICAL RESULT CALLED TO, READ BACK BY AND VERIFIED WITH: ALEX CHAPPELL @0950  ON 04/07/2019 BY FMW Performed at [facility], [address] [place], [place] [address]    Culture GRAM POSITIVE COCCI  Final   Report Status PENDING  Incomplete  Blood Culture (routine x 2)     Status: None (Preliminary result)   Collection Time: 04/06/19 10:55 AM   Specimen: BLOOD  Result Value Ref Range Status   Specimen Description BLOOD RAC  Final   Special Requests   Final    BOTTLES DRAWN AEROBIC AND ANAEROBIC Blood Culture results may not be optimal due to an excessive volume of blood received in culture bottles   Culture   Final    NO GROWTH < 24 HOURS Performed at [facility], [address], [place], [place] [address]    Report Status PENDING  Incomplete    Best Practice/Protocols:  VTE Prophylaxis: Heparin (drip) GI Prophylaxis: Antihistamine (famotidine 40 mg daily) [facility] COVID-19 management protocol late pulmonary phase  Events: 12/4: Diagnosed with COVID-19 12/8-12: Admitted to [facility] for management of COVID-19, received remdesivir 12/18: Presented with increasing shortness of breath and hypoxemia quiring high flow O2, bilateral pulmonary infiltrates consistent with late, pulmonary phase of COVID-19 disease.  Initiated COVID-19 management protocol for late pulmonary phase.   Studies: CT Angio Chest PE W and/or Wo Contrast  Result Date: 04/06/2019 CLINICAL DATA:  COVID-19 positive with hypoxemia/shortness of breath EXAM: CT ANGIOGRAPHY CHEST WITH CONTRAST TECHNIQUE: Multidetector CT imaging of the chest was performed using the  standard protocol during bolus administration of intravenous contrast. Multiplanar CT image reconstructions and MIPs were obtained to evaluate the vascular anatomy. CONTRAST:  50mL OMNIPAQUE IOHEXOL 350 MG/ML SOLN COMPARISON:  Chest CT July 07, 2012; chest radiograph April 06, 2019. FINDINGS: Cardiovascular: There is no demonstrable pulmonary embolus. There is no thoracic aortic aneurysm or dissection. The visualized great vessels appear unremarkable. There  is minimal pericardial fluid, felt to be within physiologic range. There is no pericardial thickening. The main pulmonary outflow tract measures 3.6 cm in diameter, prominent. Mediastinum/Nodes: Thyroid appears unremarkable. There are multiple subcentimeter mediastinal lymph nodes. There is a lymph node to the right of the distal trachea measuring 1.2 x 1.1 cm. There is a lymph node along the superior aortopulmonary window region measuring 1.3 x 1.0 cm. A lymph node to the right of the upper carina measures 1.3 x 1.2 cm. There is a subcarinal lymph node measuring 1.5 x 1.4 cm. There is a retrocrural lymph node on the left at the gastroesophageal junction level measuring 1.2 x 1.2 cm. No esophageal lesions are appreciable. Lungs/Pleura: There is widespread airspace opacity consistent with multifocal pneumonia. Most of this infiltrate has a ground-glass type appearance, although there are scattered areas of consolidation, particularly in the superior, lateral, and posterior segments of the right lower lobe. There is no appreciable pleural effusion. Upper Abdomen: Spleen measures 16.1 x 14.8 x 9.5 cm with a measured splenic volume 1,132 mL. Gallbladder is absent. Visualized upper abdominal structures appear unremarkable. Musculoskeletal: There are foci of degenerative change in the thoracic spine. There are no blastic or lytic bone lesions. No chest wall lesions evident. Review of the MIP images confirms the above findings. IMPRESSION: 1. No demonstrable pulmonary embolus. No thoracic aortic aneurysm or dissection. 2. Widespread airspace opacity consistent with multifocal pneumonia bilaterally. Areas of consolidation noted in the right lower lobe. Other areas have a more ground-glass type appearance. Suspect much of this infiltrate is due to atypical organism etiology. 3. Areas of adenopathy, likely secondary to the extensive pneumonitis. 4. Prominence of the main pulmonary outflow tract, a finding indicative of  pulmonary arterial hypertension. 5.  Splenomegaly. 6.  Gallbladder absent. Electronically Signed   By: Bretta Bang III M.D.   On: 04/06/2019 13:05   DG Chest Port 1 View  Result Date: 04/07/2019 CLINICAL DATA:  Recently discharged with COVID-19 pneumonia. Shortness of breath. EXAM: PORTABLE CHEST 1 VIEW COMPARISON:  April 06, 2019 FINDINGS: Diffuse bilateral pulmonary infiltrates are similar in the interval. Stable cardiomegaly. The hila and mediastinum are unchanged. No pneumothorax. IMPRESSION: No interval change in the bilateral pulmonary infiltrates. Recommend follow-up to resolution. Electronically Signed   By: Gerome Sam III M.D   On: 04/07/2019 15:17   DG Chest Port 1 View  Result Date: 04/06/2019 CLINICAL DATA:  Pt to ED by EMS from home. Pt dx with COVID 3 weeks ago and treated at [facility] and released last Saturday. Pt continues to be SOB. Pt place on non re-breather by EMS. Hx of HTN and diabetes. Former smoker. fever, recent covid, hypoxic EXAM: PORTABLE CHEST 1 VIEW COMPARISON:  Radiograph 03/26/2019 FINDINGS: Stable enlarged cardiac silhouette. There is increased patchy airspace disease in LEFT and RIGHT lung. Airspace disease is most dense in the lateral RIGHT lung. Bibasilar atelectasis. No pneumothorax IMPRESSION: Increasing bilateral patchy airspace disease consistent with viral pneumonia. Electronically Signed   By: Genevive Bi M.D.   On: 04/06/2019 10:31   DG Chest Port 1 View  Result Date: 03/26/2019  CLINICAL DATA:  Nonproductive cough. Patient reports COVID-19 positive. EXAM: PORTABLE CHEST 1 VIEW COMPARISON:  Radiograph 05/27/2014, CT 07/07/2012 FINDINGS: Patient is rotated. Upper normal heart size with unchanged mediastinal contours allowing for rotation. Pericardial opacity corresponds to a prominent pericardial fat pad on CT. Minimal streaky bibasilar opacities. No pulmonary edema, pleural effusion or pneumothorax. No acute osseous abnormalities are  seen. IMPRESSION: Minimal streaky bibasilar opacities. Findings favor atelectasis, however may represent atypical viral pneumonia in the setting of COVID-19. Electronically Signed   By: Keith Rake M.D.   On: 03/26/2019 23:39    Consults: Treatment Team:  Tyler Pita, MD   Subjective:    Overnight Issues: Spent yesterday in the ED due to no beds.  Transfer to stepdown, reports feeling better still with high FiO2 requirements but no increased work of breathing  Objective:  Vital signs for last 24 hours: Temp:  [98 F (36.7 C)-98.6 F (37 C)] 98 F (36.7 C) (12/19 1400) Pulse Rate:  [58-71] 59 (12/19 1500) Resp:  [15-29] 17 (12/19 1500) BP: (98-140)/(55-115) 103/79 (12/19 1500) SpO2:  [89 %-98 %] 92 % (12/19 1500) FiO2 (%):  [98 %-100 %] 100 % (12/19 1400)  Hemodynamic parameters for last 24 hours:    Intake/Output from previous day: 12/18 0701 - 12/19 0700 In: 9 [IV Piggyback:50] Out: -   Intake/Output this shift: Total I/O In: 1255.6 [I.V.:1255.6] Out: 700 [Urine:700]  Vent settings for last 24 hours: FiO2 (%):  [98 %-100 %] 100 %  Physical Exam:  Constitutional: well-developed, morbidly obese.  No distress, high flow O2 cannula in place. HENT:  Head: Normocephalic and atraumatic.  Mouth/Throat: Mucous membranes are . Eyes: Pupils are equal, round, and reactive to light. Conjunctivae are normal. No scleral icterus.  Neck:  Thick, no tracheal deviation.  Cardiovascular: Normal rate and regular rhythm. No murmur heard. Respiratory:  Nonlabored.  Limited exam due to full PPE/CAPR  GI: Soft.No distension. No abdominal tenderness. Protruberant/obese  Genitourinary:    Genitourinary Comments: Pure-wick in place   Musculoskeletal: Trace edema lower extremities    Neurological:   Marked improvement, appears comfortable, no overt localizing deficits. Skin: warm, dry and intact.  Psychiatric:  Appears calm, cooperative not distressed.   Assessment/Plan:    Acute hypoxic respiratory failure Late pulmonary phase of COVID-19 disease Bilateral pulmonary infiltrates Low suspicion for bacterial infection as procalcitonin and clinical picture not suggestive  High flow O2 to maintain oxygen saturations between 88 to 92% Continue anti-inflammatory therapy Though steroids recommended, severe hyperglycemia precludes use as she would need drip and in the interest of conserving PPE will try to avoid this. Utilize colchicine as anti-inflammatory Ivermectin as anti-inflammatory repeat today 1 dose (day 2)  she is allergic to doxycycline which would be recommended to use however will need to withhold Supplement magnesium if needed Supplement Zinc and Thiamine, continue vitamin C Close monitoring in stepdown/ICU High risk for intubation  Hypercoagulable state No evidence of large PE Cannot exclude microthrombi Heparin infusion which is recommended in the late pulmonary phase Anti-inflammatory therapy as above  Diabetes mellitus type 2 Poor control Avoid corticosteroids for now Subcu insulin Close monitoring  Acute kidney injury Likely ATN Avoid nephrotoxins She was volume resuscitated yesterday Mild volume overload today Low-dose Lasix x1 Follow renal panel Monitor I's and O's  Rheumatoid arthritis Recent Actemra dosing This issue adds complexity to her management Actemra discontinued 12/18 by her rheumatologist Received last Actemra dose in week PTA  Morbid obesity BMI 52 This issue adds  complexity to her management    LOS: 1 day   Additional comments: Discussed with Dr. Maryfrances Bunnellanford  Critical Care Total Time*:   C. Danice GoltzLaura Delma Drone, MD [facility] 04/07/2019  *Care during the described time interval was provided by me and/or other providers on the critical care team.  I have reviewed this patient's available data, including medical history, events of note, physical examination and test results as part of my  evaluation.  **This note was dictated using voice recognition software/Dragon.  Despite best efforts to proofread, errors can occur which can change the meaning.  Any change was purely unintentional.

## 2019-04-07 NOTE — Progress Notes (Signed)
PROGRESS NOTE    [name] [name]  GBT:[id number] DOB: 1961-01-07 DOA: 04/06/2019 PCP: [facility]      Brief Narrative:  Mrs. Humm is a 58 y.o. F with obesity, DM, RA on Actemra, dCHF, hx Cdiff, HTN, hypothyroidism, depression, neurogenic bladder with bladder pacer, and recent COVID-19 who presented with dyspnea.  Patient admitted to [facility] 12/8-12/12 for Covid, treated with 5 days remdesivir, weaned to room air at discharge.  After discharge, she did have 1 emergency room visit because she could not get her dexamethasone, had increasing SLB, although her O2 sat was good at that visit.  Since she has been at home, but has had progressive dyspnea, new fever to 103.  EMS found her hypoxic to the 70s and put her on NRB.    In the ER, CTA showed no PE, but did show multifocal pneumonia, mostly groundglass.  She was quite hypoxic, to the 70s on nonrebreather.  CCM were consulted.             Assessment & Plan:  Acute hypoxic respiratory failure Lactate 3.9 on admission, due to acute hypoxic respiratory failure.  Sepsis was not suspected by admitting physician or critical care. -Continue high flow nasal cannula -Colchicine, ivermectin per pulmonology -Heparin per pulmonology  Heart failure Has hx diastolic CHF, at present doesn't appear fluid overloaded. -Hold fluids  AKI Baseline creatinine 0.9, admission creatinine 1.3.  Improved this morning.  Diabetes Severely hyperglycemic at admission.  Glucose is improving but still very elevated, no DKA -Continue Lantus -Continue sliding scale corrections -Hold atorvastatin, Metformin  Hypertension Blood pressure normal  Hypothyroidism -Continue levothyroxine  Neurogenic bladder -Continue darifenacin  Depression -Continue Abilify, fluoxetine, BuSpar, Seroquel  Rheumatoid arthritis -Hold methotrexate  BMI 52 -Continue famotidine  Chronic pain syndrome -Continue oxycodone, Lyrica, Mirapex         MDM  and disposition: The below labs and imaging reports were reviewed and summarized above.  Medication management as above.  She has severe respiratory failure with threat to life.  The patient was admitted with admitted with acute hypoxic respiratory failure.    Pulmonology were consulted and their differential is that she has late phase COVID-19 ARDS vs pulmonary microthrombi.  Pulmonary macrothrombus ruled out, infection doubted.       DVT prophylaxis: Not applicable, on heparin infusion Code Status: Full code Family Communication:     Consultants:   Critical care  Procedures:   12/18 CT angiogram chest-no PE, multifocal pneumonia  Antimicrobials:   Levaquin 12/18 x 1  Vancomycin 12/18 x 1  Ivermectin 12/18 x 1   Subjective: Still very dyspneic.  No confusion.  No more fever.  No vomiting.  No swelling.  No hemoptysis.  No chest pain.    Objective: Vitals:   04/07/19 0630 04/07/19 0700 04/07/19 0730 04/07/19 0800  BP: 122/72 136/81 (!) 114/55 (!) 119/100  Pulse: 70 (!) 59 61 61  Resp: 20 (!) 23 16 17   Temp:      TempSrc:      SpO2: 92% 97% 95% 97%  Weight:      Height:        Intake/Output Summary (Last 24 hours) at 04/07/2019 0810 Last data filed at 04/07/2019 0240 Gross per 24 hour  Intake 50 ml  Output --  Net 50 ml   Filed Weights   04/06/19 1000  Weight: (!) 145.2 kg    Examination: General appearance: obese adult female, awake but listless, in moderate respiratory distress.   HEENT:  Anicteric, conjunctiva pink, lids and lashes normal. No nasal deformity, discharge, epistaxis.  Lips moist, denttiion normal, tongue tacky dry, no oral lesions, hearing normal.   Skin: Warm and dry.  No jaundice.  No suspicious rashes or lesions. CardiacTachycardic, regular, nl S1-S2, no murmurs appreciated.  Capillary refill is brisk.  JVP not visible.  No LE edema.  Radial pulses 2+ and symmetric. Respiratory: Tachynpeic, on HFNC, lung sounds diminished bialterally  no wheezing. Abdomen: Abdomen soft.  Mild nonfocal TTP, bloated. No ascites.   MSK: No deformities or effusions.  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  EOMI, moves all extremities. Speech fluent.    Psych: Sensorium intact and responding to questions, attention normal. Affect blunted.  Judgment and insight appear normal.    Data Reviewed: I have personally reviewed following labs and imaging studies:  CBC: Recent Labs  Lab 04/01/19 2152 04/06/19 1010 04/07/19 0422  WBC 4.5 5.0 5.6  NEUTROABS 3.3 4.3 4.9  HGB 12.0 10.1* 10.0*  HCT 35.9* 31.1* 29.7*  MCV 78.2* 81.2 77.1*  PLT 160 152 732   Basic Metabolic Panel: Recent Labs  Lab 04/01/19 2152 04/06/19 1010 04/07/19 0422 04/07/19 0613  NA 136 131* 135  --   K 3.7 3.9 4.5  --   CL 103 97* 103  --   CO2 21* 20* 21*  --   GLUCOSE 258* 577* 364*  --   BUN 15 33* 31*  --   CREATININE 0.89 1.32* 1.09* 1.05*  CALCIUM 8.8* 8.5* 8.6*  --   MG  --  1.9  --   --    GFR: Estimated Creatinine Clearance: 87.4 mL/min (A) (by C-G formula based on SCr of 1.05 mg/dL (H)). Liver Function Tests: Recent Labs  Lab 04/01/19 2152 04/06/19 1010 04/07/19 0422  AST 24 26 32  ALT 26 16 17   ALKPHOS 62 51 50  BILITOT 0.9 0.8 0.6  PROT 7.7 6.8 6.8  ALBUMIN 3.6 2.5* 2.4*   No results for input(s): LIPASE, AMYLASE in the last 168 hours. No results for input(s): AMMONIA in the last 168 hours. Coagulation Profile: Recent Labs  Lab 04/06/19 1010  INR 1.3*   Cardiac Enzymes: No results for input(s): CKTOTAL, CKMB, CKMBINDEX, TROPONINI in the last 168 hours. BNP (last 3 results) No results for input(s): PROBNP in the last 8760 hours. HbA1C: No results for input(s): HGBA1C in the last 72 hours. CBG: Recent Labs  Lab 03/31/19 1132 04/06/19 1838 04/06/19 2300  GLUCAP 168* 394* 392*   Lipid Profile: No results for input(s): CHOL, HDL, LDLCALC, TRIG, CHOLHDL, LDLDIRECT in the last 72  hours. Thyroid Function Tests: No results for input(s): TSH, T4TOTAL, FREET4, T3FREE, THYROIDAB in the last 72 hours. Anemia Panel: Recent Labs    04/06/19 1010  FERRITIN 1,097*   Urine analysis:    Component Value Date/Time   COLORURINE YELLOW (A) 04/06/2019 2118   APPEARANCEUR CLOUDY (A) 04/06/2019 2118   APPEARANCEUR Clear 11/30/2013 1010   LABSPEC 1.040 (H) 04/06/2019 2118   LABSPEC 1.015 11/30/2013 1010   PHURINE 6.0 04/06/2019 2118   GLUCOSEU >=500 (A) 04/06/2019 2118   GLUCOSEU >=500 11/30/2013 [place] 04/06/2019 2118   BILIRUBINUR NEGATIVE 04/06/2019 2118   BILIRUBINUR Negative 11/30/2013 [place] 04/06/2019 2118   PROTEINUR 30 (A) 04/06/2019 2118   UROBILINOGEN 0.2 03/14/2007 1000   NITRITE NEGATIVE 04/06/2019 2118   LEUKOCYTESUR SMALL (A) 04/06/2019 2118   LEUKOCYTESUR  Negative 11/30/2013 1010   Sepsis Labs: @LABRCNTIP (procalcitonin:4,lacticacidven:4)  ) Recent Results (from the past 240 hour(s))  Blood culture (routine x 2)     Status: None   Collection Time: 04/01/19  9:52 PM   Specimen: BLOOD  Result Value Ref Range Status   Specimen Description BLOOD RIGHT ANTECUBITAL  Final   Special Requests   Final    BOTTLES DRAWN AEROBIC AND ANAEROBIC Blood Culture results may not be optimal due to an inadequate volume of blood received in culture bottles   Culture   Final    NO GROWTH 5 DAYS Performed at [facility], [address] [place], [place] [address]    Report Status 04/06/2019 FINAL  Final  Blood culture (routine x 2)     Status: None   Collection Time: 04/01/19  9:52 PM   Specimen: BLOOD  Result Value Ref Range Status   Specimen Description BLOOD LEFT ANTECUBITAL  Final   Special Requests   Final    BOTTLES DRAWN AEROBIC AND ANAEROBIC Blood Culture adequate volume   Culture   Final    NO GROWTH 5 DAYS Performed at [facility], [address], [place], [place] [address]    Report Status  04/06/2019 FINAL  Final  Blood Culture (routine x 2)     Status: None (Preliminary result)   Collection Time: 04/06/19 10:10 AM   Specimen: BLOOD  Result Value Ref Range Status   Specimen Description BLOOD RAC  Final   Special Requests   Final    BOTTLES DRAWN AEROBIC AND ANAEROBIC Blood Culture adequate volume   Culture   Final    NO GROWTH < 24 HOURS Performed at [facility], [address], [place], [place] [address]    Report Status PENDING  Incomplete  Blood Culture (routine x 2)     Status: None (Preliminary result)   Collection Time: 04/06/19 10:55 AM   Specimen: BLOOD  Result Value Ref Range Status   Specimen Description BLOOD RAC  Final   Special Requests   Final    BOTTLES DRAWN AEROBIC AND ANAEROBIC Blood Culture results may not be optimal due to an excessive volume of blood received in culture bottles   Culture   Final    NO GROWTH < 24 HOURS Performed at [facility], [address] [place], [place] [address]    Report Status PENDING  Incomplete         Radiology Studies: CT Angio Chest PE W and/or Wo Contrast  Result Date: 04/06/2019 CLINICAL DATA:  COVID-19 positive with hypoxemia/shortness of breath EXAM: CT ANGIOGRAPHY CHEST WITH CONTRAST TECHNIQUE: Multidetector CT imaging of the chest was performed using the standard protocol during bolus administration of intravenous contrast. Multiplanar CT image reconstructions and MIPs were obtained to evaluate the vascular anatomy. CONTRAST:  15mL OMNIPAQUE IOHEXOL 350 MG/ML SOLN COMPARISON:  Chest CT July 07, 2012; chest radiograph April 06, 2019. FINDINGS: Cardiovascular: There is no demonstrable pulmonary embolus. There is no thoracic aortic aneurysm or dissection. The visualized great vessels appear unremarkable. There is minimal pericardial fluid, felt to be within physiologic range. There is no pericardial thickening. The main pulmonary outflow tract measures 3.6 cm in diameter, prominent.  Mediastinum/Nodes: Thyroid appears unremarkable. There are multiple subcentimeter mediastinal lymph nodes. There is a lymph node to the right of the distal trachea measuring 1.2 x 1.1 cm. There is a lymph node along the superior aortopulmonary window region measuring 1.3 x 1.0 cm. A lymph node  to the right of the upper carina measures 1.3 x 1.2 cm. There is a subcarinal lymph node measuring 1.5 x 1.4 cm. There is a retrocrural lymph node on the left at the gastroesophageal junction level measuring 1.2 x 1.2 cm. No esophageal lesions are appreciable. Lungs/Pleura: There is widespread airspace opacity consistent with multifocal pneumonia. Most of this infiltrate has a ground-glass type appearance, although there are scattered areas of consolidation, particularly in the superior, lateral, and posterior segments of the right lower lobe. There is no appreciable pleural effusion. Upper Abdomen: Spleen measures 16.1 x 14.8 x 9.5 cm with a measured splenic volume 1,132 mL. Gallbladder is absent. Visualized upper abdominal structures appear unremarkable. Musculoskeletal: There are foci of degenerative change in the thoracic spine. There are no blastic or lytic bone lesions. No chest wall lesions evident. Review of the MIP images confirms the above findings. IMPRESSION: 1. No demonstrable pulmonary embolus. No thoracic aortic aneurysm or dissection. 2. Widespread airspace opacity consistent with multifocal pneumonia bilaterally. Areas of consolidation noted in the right lower lobe. Other areas have a more ground-glass type appearance. Suspect much of this infiltrate is due to atypical organism etiology. 3. Areas of adenopathy, likely secondary to the extensive pneumonitis. 4. Prominence of the main pulmonary outflow tract, a finding indicative of pulmonary arterial hypertension. 5.  Splenomegaly. 6.  Gallbladder absent. Electronically Signed   By: Bretta BangWilliam  Woodruff III M.D.   On: 04/06/2019 13:05   DG Chest Port 1  View  Result Date: 04/06/2019 CLINICAL DATA:  Pt to ED by EMS from home. Pt dx with [facility] 3 weeks ago [facility]. Pt continues to be SOB. Pt place on non re-breather by EMS. Hx of HTN and diabetes. Former smoker. fever, recent covid, hypoxic EXAM: PORTABLE CHEST 1 VIEW COMPARISON:  Radiograph 03/26/2019 FINDINGS: Stable enlarged cardiac silhouette. There is increased patchy airspace disease in LEFT and RIGHT lung. Airspace disease is most dense in the lateral RIGHT lung. Bibasilar atelectasis. No pneumothorax IMPRESSION: Increasing bilateral patchy airspace disease consistent with viral pneumonia. Electronically Signed   By: Genevive BiStewart  Edmunds M.D.   On: 04/06/2019 10:31        Scheduled Meds: . ARIPiprazole  5 mg Oral Daily  . vitamin C  500 mg Oral Daily  . atorvastatin  80 mg Oral q1800  . busPIRone  10 mg Oral BID  . calcium-vitamin D  1 tablet Oral Daily  . cholecalciferol  1,000 Units Oral Q breakfast  . colchicine  0.6 mg Oral BID  . darifenacin  7.5 mg Oral Daily  . famotidine  40 mg Oral Daily  . FLUoxetine  20 mg Oral Daily  . folic acid  1 mg Oral Daily  . guaiFENesin-dextromethorphan  5 mL Oral BID  . heparin  5,000 Units Subcutaneous Q8H  . insulin aspart  0-15 Units Subcutaneous TID WC  . insulin aspart  0-5 Units Subcutaneous QHS  . insulin glargine  65 Units Subcutaneous Daily  . ipratropium  2 puff Inhalation Q4H  . levothyroxine  88 mcg Oral QAC breakfast  . pramipexole  0.125 mg Oral BID  . pregabalin  150 mg Oral TID  . QUEtiapine  300 mg Oral QHS  . zinc sulfate  220 mg Oral Daily  . zolpidem  5 mg Oral QHS   Continuous Infusions: . sodium chloride 75 mL/hr at 04/07/19 0808  . heparin 1,500 Units/hr (04/06/19 2110)  . lactated ringers 75 mL/hr at 04/07/19  0240  . thiamine injection Stopped (04/07/19 0240)     LOS: 1 day    Time spent: 35 minutes    Alberteen Samhristopher P Adiya Selmer, MD [facility] 04/07/2019,  8:10 AM     Please page though AMION or Epic secure chat:  For [employer] password, Higher education careers advisercontact charge nurse

## 2019-04-07 NOTE — Consult Note (Deleted)
ANTICOAGULATION CONSULT NOTE -  Pharmacy Consult for Heparin Infusion Indication: Hypercoagable state 2/2 COVID disease  Allergies  Allergen Reactions  . Cephalexin Hives  . Codeine Palpitations, Nausea Only, Nausea And Vomiting, Rash and Shortness Of Breath    "makes heart fly, she gets flushed and passes out"  . Doxycycline Rash  . Propoxyphene Rash and Shortness Of Breath    Increase heart rate  . Sulfa Antibiotics Palpitations, Nausea Only, Shortness Of Breath and Hives    "makes heart fly, she gets flushed and passes out"  . Lovenox [Enoxaparin Sodium] Hives  . Hydrocodone Nausea And Vomiting    Hear racing & breaks out into a cold sweat.  . Meropenem Rash    Erythematous, hot, pruritic rash over arms, chest, back, abdomen, and face occurred at the end of meropenem infusion on 02/22/18    Patient Measurements: Height: 5\' 6"  (167.6 cm) Weight: (!) 320 lb (145.2 kg) IBW/kg (Calculated) : 59.3 Heparin Dosing Weight: 95.4  Vital Signs: Temp: 98.4 F (36.9 C) (12/19 0005) Temp Source: Oral (12/19 0005) BP: 118/96 (12/19 1032) Pulse Rate: 63 (12/19 1032)  Labs: Recent Labs    04/06/19 1010 04/07/19 0243 04/07/19 0422 04/07/19 0613 04/07/19 0837  HGB 10.1*  --  10.0*  --   --   HCT 31.1*  --  29.7*  --   --   PLT 152  --  175  --   --   APTT 38*  --   --   --   --   LABPROT 15.8*  --   --   --   --   INR 1.3*  --   --   --   --   HEPARINUNFRC  --  0.67  --   --  0.50  CREATININE 1.32*  --  1.09* 1.05*  --   TROPONINIHS  --   --   --  3 3    Estimated Creatinine Clearance: 87.4 mL/min (A) (by C-G formula based on SCr of 1.05 mg/dL (H)).   Medical History: Past Medical History:  Diagnosis Date  . Abdominal wall hernia 01/29/2013  . Anxiety   . Arthritis    Rheumatoid  . C. difficile colitis   . Chronic diastolic heart failure (HCC)   . Depression   . Diabetes mellitus    states no meds or diet restrictions  at present  . Diastolic CHF (HCC)   .  Esophagitis   . Fluid retention   . GERD (gastroesophageal reflux disease)   . Hiatal hernia   . Hypertension   . Hypokalemia due to loss of potassium 10/21/2015   Overview:  Associated with 3 weeks of diarrhea  And QT prolongation.  . Hypothyroidism   . IBS (irritable bowel syndrome)   . Moderate episode of recurrent major depressive disorder (HCC) 06/03/2004  . Morbid obesity (HCC)   . MRSA (methicillin resistant Staphylococcus aureus) infection 11/2017   left inner thigh abcess  . Neurogenic bladder    has pacemaker  . Neuropathy   . Obesity   . Panic attacks   . Rheumatoid arthritis (HCC)   . Sleep apnea    STATES SEVERE, CANT TOLERATE MASK- LAST STUDY YEARS AGO    Medications:  No anticoagulation prior to admission per medication reconcilation  Assessment: 58 y/o F with history of anxiety, diabetes, gastric reflux, hypertension, CHF, obesity who presented to [facility] ED 12/18 with shortness of breath. Previous COVID diagnosis for which she was admitted to [facility]  12/8-12/12. D dimer elevated to 1405. CXR with bilateral patchy infiltration. CTA negative for PE. Pharmacy has been consulted to initiate heparin drip for hypercoagulable state secondary to COVID disease.  Baseline INR 1.2, aPTT 38. Patient anemic with Hgb of 10. Platelets at lower limit of normal at 152.   Goal of Therapy:  Heparin level 0.3-0.7 units/ml Monitor platelets by anticoagulation protocol: Yes   Plan:  12/19 @ 0245 HL 0.67 therapeutic. Will continue current rate and recheck HL @ 0900 and continue to monitor.  12/19 @ 0837 HL = 0.50. Will continue current rate and recheck HL @ 15:00.  Olivia Canter, [facility] Clinical Pharmacist 04/07/2019,10:54 AM

## 2019-04-07 NOTE — ED Notes (Signed)
Attempted to call receiving RN. Was told that the floor can't take anymore patients until they move some patients off their floor. Informed charge Therapist, sports.

## 2019-04-07 NOTE — ED Notes (Signed)
Pt observed coughing, pt reports slight increase to work of breathing. SpO2 is holding steady at 97%

## 2019-04-07 NOTE — Consult Note (Signed)
ANTICOAGULATION CONSULT NOTE - Initial Consult  Pharmacy Consult for Heparin Infusion Indication: Hypercoagable state 2/2 COVID disease  Allergies  Allergen Reactions  . Cephalexin Hives  . Codeine Palpitations, Nausea Only, Nausea And Vomiting, Rash and Shortness Of Breath    "makes heart fly, she gets flushed and passes out"  . Doxycycline Rash  . Propoxyphene Rash and Shortness Of Breath    Increase heart rate  . Sulfa Antibiotics Palpitations, Nausea Only, Shortness Of Breath and Hives    "makes heart fly, she gets flushed and passes out"  . Lovenox [Enoxaparin Sodium] Hives  . Hydrocodone Nausea And Vomiting    Hear racing & breaks out into a cold sweat.  . Meropenem Rash    Erythematous, hot, pruritic rash over arms, chest, back, abdomen, and face occurred at the end of meropenem infusion on 02/22/18    Patient Measurements: Height: 5\' 6"  (167.6 cm) Weight: (!) 320 lb (145.2 kg) IBW/kg (Calculated) : 59.3 Heparin Dosing Weight: 95.4  Vital Signs: Temp: 98.4 F (36.9 C) (12/19 0005) Temp Source: Oral (12/19 0005) BP: 113/70 (12/19 0300) Pulse Rate: 63 (12/19 0300)  Labs: Recent Labs    04/06/19 1010 04/07/19 0243  HGB 10.1*  --   HCT 31.1*  --   PLT 152  --   APTT 38*  --   LABPROT 15.8*  --   INR 1.3*  --   HEPARINUNFRC  --  0.67  CREATININE 1.32*  --     Estimated Creatinine Clearance: 69.6 mL/min (A) (by C-G formula based on SCr of 1.32 mg/dL (H)).   Medical History: Past Medical History:  Diagnosis Date  . Abdominal wall hernia 01/29/2013  . Anxiety   . Arthritis    Rheumatoid  . C. difficile colitis   . Chronic diastolic heart failure (HCC)   . Depression   . Diabetes mellitus    states no meds or diet restrictions  at present  . Diastolic CHF (HCC)   . Esophagitis   . Fluid retention   . GERD (gastroesophageal reflux disease)   . Hiatal hernia   . Hypertension   . Hypokalemia due to loss of potassium 10/21/2015   Overview:  Associated  with 3 weeks of diarrhea  And QT prolongation.  . Hypothyroidism   . IBS (irritable bowel syndrome)   . Moderate episode of recurrent major depressive disorder (HCC) 06/03/2004  . Morbid obesity (HCC)   . MRSA (methicillin resistant Staphylococcus aureus) infection 11/2017   left inner thigh abcess  . Neurogenic bladder    has pacemaker  . Neuropathy   . Obesity   . Panic attacks   . Rheumatoid arthritis (HCC)   . Sleep apnea    STATES SEVERE, CANT TOLERATE MASK- LAST STUDY YEARS AGO    Medications:  No anticoagulation prior to admission per medication reconcilation  Assessment: 58 y/o F with history of anxiety, diabetes, gastric reflux, hypertension, CHF, obesity who presented to [facility] ED 12/18 with shortness of breath. Previous COVID diagnosis for which she was admitted to [facility] 12/8-12/12. D dimer elevated to 1405. CXR with bilateral patchy infiltration. CTA negative for PE. Pharmacy has been consulted to initiate heparin drip for hypercoagulable state secondary to COVID disease.  Baseline INR 1.2, aPTT 38. Patient anemic with Hgb of 10. Platelets at lower limit of normal at 152.   Goal of Therapy:  Heparin level 0.3-0.7 units/ml Monitor platelets by anticoagulation protocol: Yes   Plan:  12/19 @ 0245 HL 0.67 therapeutic. Will  continue current rate and recheck HL @ 0900 and continue to monitor.  Tobie Lords, PharmD, BCPS Clinical Pharmacist 04/07/2019,3:32 AM

## 2019-04-08 ENCOUNTER — Inpatient Hospital Stay: Payer: Medicaid Other

## 2019-04-08 DIAGNOSIS — Z794 Long term (current) use of insulin: Secondary | ICD-10-CM

## 2019-04-08 DIAGNOSIS — E1129 Type 2 diabetes mellitus with other diabetic kidney complication: Secondary | ICD-10-CM

## 2019-04-08 DIAGNOSIS — N1831 Chronic kidney disease, stage 3a: Secondary | ICD-10-CM

## 2019-04-08 LAB — GLUCOSE, CAPILLARY
Glucose-Capillary: 351 mg/dL — ABNORMAL HIGH (ref 70–99)
Glucose-Capillary: 357 mg/dL — ABNORMAL HIGH (ref 70–99)
Glucose-Capillary: 268 mg/dL — ABNORMAL HIGH (ref 70–99)
Glucose-Capillary: 199 mg/dL — ABNORMAL HIGH (ref 70–99)

## 2019-04-08 LAB — CBC WITH DIFFERENTIAL/PLATELET
Abs Immature Granulocytes: 0 10*3/uL (ref 0.00–0.07)
Basophils Absolute: 0 10*3/uL (ref 0.0–0.1)
Basophils Relative: 0 %
Eosinophils Absolute: 0 10*3/uL (ref 0.0–0.5)
Eosinophils Relative: 0 %
HCT: 29.3 % — ABNORMAL LOW (ref 36.0–46.0)
Hemoglobin: 10.5 g/dL — ABNORMAL LOW (ref 12.0–15.0)
Lymphocytes Relative: 6 %
Lymphs Abs: 0.4 10*3/uL — ABNORMAL LOW (ref 0.7–4.0)
MCH: 28.9 pg (ref 26.0–34.0)
MCHC: 35.8 g/dL (ref 30.0–36.0)
MCV: 80.7 fL (ref 80.0–100.0)
Monocytes Absolute: 0.1 10*3/uL (ref 0.1–1.0)
Monocytes Relative: 2 %
Neutro Abs: 6.7 10*3/uL (ref 1.7–7.7)
Neutrophils Relative %: 90 %
Other: 2 %
Platelets: 197 10*3/uL (ref 150–400)
RBC: 3.63 MIL/uL — ABNORMAL LOW (ref 3.87–5.11)
RDW: 15.3 % (ref 11.5–15.5)
WBC: 7.4 10*3/uL (ref 4.0–10.5)
nRBC: 0 % (ref 0.0–0.2)

## 2019-04-08 LAB — HEPARIN LEVEL (UNFRACTIONATED)
Heparin Unfractionated: 0.24 IU/mL — ABNORMAL LOW (ref 0.30–0.70)
Heparin Unfractionated: 0.67 IU/mL (ref 0.30–0.70)
Heparin Unfractionated: 0.76 IU/mL — ABNORMAL HIGH (ref 0.30–0.70)

## 2019-04-08 LAB — COMPREHENSIVE METABOLIC PANEL
ALT: 24 U/L (ref 0–44)
AST: 53 U/L — ABNORMAL HIGH (ref 15–41)
Albumin: 2.5 g/dL — ABNORMAL LOW (ref 3.5–5.0)
Alkaline Phosphatase: 51 U/L (ref 38–126)
Anion gap: 8 (ref 5–15)
BUN: 29 mg/dL — ABNORMAL HIGH (ref 6–20)
CO2: 28 mmol/L (ref 22–32)
Calcium: 9.1 mg/dL (ref 8.9–10.3)
Chloride: 102 mmol/L (ref 98–111)
Creatinine, Ser: 1.06 mg/dL — ABNORMAL HIGH (ref 0.44–1.00)
GFR calc Af Amer: >60 mL/min (ref >60)
GFR calc non Af Amer: 58 mL/min — ABNORMAL LOW (ref >60)
Glucose, Bld: 182 mg/dL — ABNORMAL HIGH (ref 70–99)
Potassium: 4 mmol/L (ref 3.5–5.1)
Sodium: 138 mmol/L (ref 135–145)
Total Bilirubin: 0.7 mg/dL (ref 0.3–1.2)
Total Protein: 7 g/dL (ref 6.5–8.1)

## 2019-04-08 LAB — FERRITIN: Ferritin: 1051 ng/mL — ABNORMAL HIGH (ref 11–307)

## 2019-04-08 LAB — FIBRIN DERIVATIVES D-DIMER (ARMC ONLY): Fibrin derivatives D-dimer (ARMC): 1197.82 ng/mL (FEU) — ABNORMAL HIGH (ref 0.00–499.00)

## 2019-04-08 LAB — URINE CULTURE: Culture: >=100000 — AB

## 2019-04-08 LAB — MAGNESIUM: Magnesium: 2.1 mg/dL (ref 1.7–2.4)

## 2019-04-08 LAB — C-REACTIVE PROTEIN: CRP: 8.1 mg/dL — ABNORMAL HIGH (ref <1.0)

## 2019-04-08 MED ORDER — CICLESONIDE 160 MCG/ACT IN AERS
4.0000 | INHALATION_SPRAY | Freq: Two times a day (BID) | RESPIRATORY_TRACT | Status: DC
  Filled 2019-04-08: qty 4

## 2019-04-08 MED ORDER — METHYLPREDNISOLONE SODIUM SUCC 125 MG IJ SOLR
INTRAMUSCULAR | Status: AC
  Administered 2019-04-08: 60 mg via INTRAVENOUS
  Filled 2019-04-08: qty 2

## 2019-04-08 MED ORDER — MUPIROCIN 2 % EX OINT
TOPICAL_OINTMENT | Freq: Two times a day (BID) | CUTANEOUS | Status: DC
  Administered 2019-04-11: 1 via NASAL
  Filled 2019-04-08: qty 22

## 2019-04-08 MED ORDER — LINAGLIPTIN 5 MG PO TABS
5.0000 mg | ORAL_TABLET | Freq: Every day | ORAL | Status: DC
  Administered 2019-04-08 – 2019-05-04 (×28): 5 mg via ORAL
  Filled 2019-04-08 (×27): qty 1

## 2019-04-08 MED ORDER — MAGIC MOUTHWASH
15.0000 mL | Freq: Three times a day (TID) | ORAL | Status: DC | PRN
  Administered 2019-04-08 – 2019-04-10 (×3): 15 mL via ORAL
  Filled 2019-04-08 (×5): qty 20

## 2019-04-08 MED ORDER — FLUTICASONE PROPIONATE HFA 220 MCG/ACT IN AERO
4.0000 | INHALATION_SPRAY | Freq: Two times a day (BID) | RESPIRATORY_TRACT | Status: AC
  Administered 2019-04-08 (×2): 4 via RESPIRATORY_TRACT
  Filled 2019-04-08: qty 12

## 2019-04-08 MED ORDER — METHYLPREDNISOLONE SODIUM SUCC 125 MG IJ SOLR: 60.0000 mg | INTRAMUSCULAR | Status: AC

## 2019-04-08 MED ORDER — AEROCHAMBER PLUS FLO-VU MEDIUM MISC
1.0000 | Freq: Once | Status: DC
  Filled 2019-04-08 (×2): qty 1

## 2019-04-08 MED ORDER — QUETIAPINE FUMARATE 200 MG PO TABS
200.0000 mg | ORAL_TABLET | Freq: Every day | ORAL | Status: DC
  Administered 2019-04-08: 200 mg via ORAL
  Filled 2019-04-08: qty 1

## 2019-04-08 MED ORDER — ALBUMIN HUMAN 25 % IV SOLN
12.5000 g | Freq: Once | INTRAVENOUS | Status: AC
  Administered 2019-04-08: 12.5 g via INTRAVENOUS
  Filled 2019-04-08: qty 50

## 2019-04-08 MED ORDER — INSULIN ASPART 100 UNIT/ML ~~LOC~~ SOLN
0.0000 [IU] | Freq: Every day | SUBCUTANEOUS | Status: DC
  Administered 2019-04-10: 3 [IU] via SUBCUTANEOUS
  Administered 2019-04-11: 2 [IU] via SUBCUTANEOUS
  Administered 2019-04-12: 3 [IU] via SUBCUTANEOUS
  Administered 2019-04-18: 4 [IU] via SUBCUTANEOUS
  Administered 2019-04-20: 3 [IU] via SUBCUTANEOUS
  Administered 2019-04-21: 4 [IU] via SUBCUTANEOUS
  Administered 2019-04-22: 3 [IU] via SUBCUTANEOUS
  Administered 2019-04-23: 5 [IU] via SUBCUTANEOUS
  Administered 2019-04-24: 3 [IU] via SUBCUTANEOUS
  Administered 2019-04-27 – 2019-04-28 (×2): 2 [IU] via SUBCUTANEOUS
  Administered 2019-04-29: 3 [IU] via SUBCUTANEOUS
  Administered 2019-04-30: 4 [IU] via SUBCUTANEOUS
  Administered 2019-05-01: 2 [IU] via SUBCUTANEOUS
  Administered 2019-05-02 – 2019-05-03 (×2): 3 [IU] via SUBCUTANEOUS
  Filled 2019-04-08 (×22): qty 1

## 2019-04-08 MED ORDER — LACTATED RINGERS IV BOLUS
750.0000 mL | Freq: Once | INTRAVENOUS | Status: AC
  Administered 2019-04-08: 750 mL via INTRAVENOUS

## 2019-04-08 MED ORDER — INSULIN ASPART 100 UNIT/ML ~~LOC~~ SOLN
0.0000 [IU] | Freq: Three times a day (TID) | SUBCUTANEOUS | Status: DC
  Administered 2019-04-08: 20 [IU] via SUBCUTANEOUS
  Administered 2019-04-08: 11 [IU] via SUBCUTANEOUS
  Administered 2019-04-08: 20 [IU] via SUBCUTANEOUS
  Administered 2019-04-09 – 2019-04-10 (×4): 3 [IU] via SUBCUTANEOUS
  Administered 2019-04-10 – 2019-04-11 (×3): 7 [IU] via SUBCUTANEOUS
  Administered 2019-04-11: 11 [IU] via SUBCUTANEOUS
  Administered 2019-04-11 – 2019-04-12 (×2): 7 [IU] via SUBCUTANEOUS
  Administered 2019-04-12: 11 [IU] via SUBCUTANEOUS
  Administered 2019-04-13: 7 [IU] via SUBCUTANEOUS
  Administered 2019-04-13: 3 [IU] via SUBCUTANEOUS
  Administered 2019-04-14: 4 [IU] via SUBCUTANEOUS
  Administered 2019-04-14 – 2019-04-15 (×2): 3 [IU] via SUBCUTANEOUS
  Administered 2019-04-15 – 2019-04-16 (×2): 7 [IU] via SUBCUTANEOUS
  Filled 2019-04-08 (×20): qty 1

## 2019-04-08 MED ORDER — FLUCONAZOLE 50 MG PO TABS
150.0000 mg | ORAL_TABLET | Freq: Once | ORAL | Status: AC
  Administered 2019-04-08: 150 mg via ORAL
  Filled 2019-04-08: qty 1

## 2019-04-08 MED ORDER — HEPARIN BOLUS VIA INFUSION
1400.0000 [IU] | Freq: Once | INTRAVENOUS | Status: AC
  Administered 2019-04-08: 1400 [IU] via INTRAVENOUS
  Filled 2019-04-08: qty 1400

## 2019-04-08 MED ORDER — CICLESONIDE 160 MCG/ACT IN AERS
4.0000 | INHALATION_SPRAY | Freq: Two times a day (BID) | RESPIRATORY_TRACT | Status: DC
  Administered 2019-04-09 – 2019-05-04 (×50): 4 via RESPIRATORY_TRACT
  Filled 2019-04-08 (×3): qty 4

## 2019-04-08 MED ORDER — BUDESONIDE 180 MCG/ACT IN AEPB
4.0000 | INHALATION_SPRAY | Freq: Two times a day (BID) | RESPIRATORY_TRACT | Status: DC
  Administered 2019-04-08: 4 via RESPIRATORY_TRACT
  Filled 2019-04-08: qty 1

## 2019-04-08 MED ORDER — FUROSEMIDE 10 MG/ML IJ SOLN
20.0000 mg | Freq: Once | INTRAMUSCULAR | Status: AC
  Administered 2019-04-08: 20 mg via INTRAVENOUS
  Filled 2019-04-08: qty 2

## 2019-04-08 NOTE — Consult Note (Signed)
ANTICOAGULATION CONSULT NOTE  Pharmacy Consult for Heparin Infusion Indication: Hypercoagable state 2/2 COVID disease  Allergies  Allergen Reactions  . Cephalexin Hives  . Codeine Palpitations, Nausea Only, Nausea And Vomiting, Rash and Shortness Of Breath    "makes heart fly, she gets flushed and passes out"  . Doxycycline Rash  . Propoxyphene Rash and Shortness Of Breath    Increase heart rate  . Sulfa Antibiotics Palpitations, Nausea Only, Shortness Of Breath and Hives    "makes heart fly, she gets flushed and passes out"  . Lovenox [Enoxaparin Sodium] Hives  . Hydrocodone Nausea And Vomiting    Hear racing & breaks out into a cold sweat.  . Meropenem Rash    Erythematous, hot, pruritic rash over arms, chest, back, abdomen, and face occurred at the end of meropenem infusion on 02/22/18    Patient Measurements: Height: 5\' 6"  (167.6 cm) Weight: (!) 320 lb (145.2 kg) IBW/kg (Calculated) : 59.3 Heparin Dosing Weight: 95.4  Vital Signs: Temp: 98.7 F (37.1 C) (12/20 0400) Temp Source: Oral (12/20 0400) BP: 101/61 (12/20 0600) Pulse Rate: 63 (12/20 0600)  Labs: Recent Labs    04/06/19 1010 04/07/19 0243 04/07/19 0422 04/07/19 0613 04/07/19 0837 04/08/19 0523 04/08/19 0745  HGB 10.1*  --  10.0*  --   --  10.5*  --   HCT 31.1*  --  29.7*  --   --  29.3*  --   PLT 152  --  175  --   --  197  --   APTT 38*  --   --   --   --   --   --   LABPROT 15.8*  --   --   --   --   --   --   INR 1.3*  --   --   --   --   --   --   HEPARINUNFRC  --  0.67  --   --  0.50  --  0.24*  CREATININE 1.32*  --  1.09* 1.05*  --  1.06*  --   TROPONINIHS  --   --   --  3 3  --   --     Estimated Creatinine Clearance: 86.6 mL/min (A) (by C-G formula based on SCr of 1.06 mg/dL (H)).   Medical History: Past Medical History:  Diagnosis Date  . Abdominal wall hernia 01/29/2013  . Anxiety   . Arthritis    Rheumatoid  . C. difficile colitis   . Chronic diastolic heart failure ([place])   .  Depression   . Diabetes mellitus    states no meds or diet restrictions  at present  . Diastolic CHF ([place])   . Esophagitis   . Fluid retention   . GERD (gastroesophageal reflux disease)   . Hiatal hernia   . Hypertension   . Hypokalemia due to loss of potassium 10/21/2015   Overview:  Associated with 3 weeks of diarrhea  And QT prolongation.  . Hypothyroidism   . IBS (irritable bowel syndrome)   . Moderate episode of recurrent major depressive disorder ([place]) 06/03/2004  . Morbid obesity ([place])   . MRSA (methicillin resistant Staphylococcus aureus) infection 11/2017   left inner thigh abcess  . Neurogenic bladder    has pacemaker  . Neuropathy   . Obesity   . Panic attacks   . Rheumatoid arthritis ([place])   . Sleep apnea    STATES SEVERE, CANT TOLERATE MASK- LAST STUDY YEARS AGO  Medications:  No anticoagulation prior to admission per medication reconcilation  Assessment: 58 y/o F with history of anxiety, diabetes, gastric reflux, hypertension, CHF, obesity who presented to [facility] ED 12/18 with shortness of breath. Previous COVID diagnosis for which she was admitted to [facility] 12/8-12/12. D dimer elevated to 1405. CXR with bilateral patchy infiltration. CTA negative for PE. Pharmacy has been consulted to initiate heparin drip for hypercoagulable state secondary to COVID disease.  Heparin infusing at 1500 units/hr.   Goal of Therapy:  Heparin level 0.3-0.7 units/ml Monitor platelets by anticoagulation protocol: Yes   Plan:  Confirmed with RN no noted interruptions in therapy.   Will bolus heparin 1400 units and increase rate to 1700 units/hr.   Will obtain anti-Xa level at 1600. CBC with am labs.   Pharmacy will continue to monitor and adjust per consult.   MLS 04/08/2019,9:08 AM

## 2019-04-08 NOTE — Progress Notes (Signed)
Follow up - Critical Care Medicine Note  Patient Details:    [name] [name] is an 58 y.o. female former smoker with a history of diabetes mellitus, morbid obesity and rheumatoid arthritis diagnosed with COVID-19 on December 4.  Admitted to [facility] from 8 December through 12 December received 5 days of remdesivir.  Also received dexamethasone.  She has been found to be on the late pulmonary phase of COVID-19 infection marked by intense inflammatory response.  Lines, Airways, Drains: External Urinary Catheter (Active)  Collection Container Dedicated Suction Canister 04/07/19 1400  Site Assessment Clean;Intact 04/07/19 1400    Anti-infectives:  Anti-infectives (From admission, onward)   Start     Dose/Rate Route Frequency Ordered Stop   04/08/19 0500  fluconazole (DIFLUCAN) tablet 150 mg     150 mg Oral  Once 04/08/19 0453 04/08/19 0609   04/07/19 1545  ivermectin (STROMECTOL) tablet 28,500 mcg     200 mcg/kg  145.2 kg Oral  Once 04/07/19 1540 04/07/19 2048   04/06/19 2300  ivermectin (STROMECTOL) tablet 18,000 mcg     200 mcg/kg  93.7 kg (Adjusted) Oral  Once 04/06/19 2008 04/06/19 2356   04/06/19 1115  levofloxacin (LEVAQUIN) IVPB 750 mg     750 mg 100 mL/hr over 90 Minutes Intravenous  Once 04/06/19 1108 04/06/19 1346   04/06/19 1115  vancomycin (VANCOCIN) IVPB 1000 mg/200 mL premix     1,000 mg 200 mL/hr over 60 Minutes Intravenous  Once 04/06/19 1108 04/06/19 1550      Microbiology: Results for orders placed or performed during the hospital encounter of 04/06/19  MRSA PCR Screening     Status: Abnormal   Collection Time: 04/06/19  9:48 AM   Specimen: Nasopharyngeal  Result Value Ref Range Status   MRSA by PCR POSITIVE (A) NEGATIVE Final    Comment:        The GeneXpert MRSA Assay (FDA approved for NASAL specimens only), is one component of a comprehensive MRSA colonization surveillance program. It is not intended to diagnose MRSA infection nor to guide  or monitor treatment for MRSA infections. RESULT CALLED TO, READ BACK BY AND VERIFIED WITH: JAMIE DEEM @ 7209 ON 04/07/2019 BY CAF Performed at [facility], [address] [place], [place] [address]   Blood Culture (routine x 2)     Status: None (Preliminary result)   Collection Time: 04/06/19 10:10 AM   Specimen: BLOOD  Result Value Ref Range Status   Specimen Description BLOOD RAC  Final   Special Requests   Final    BOTTLES DRAWN AEROBIC AND ANAEROBIC Blood Culture adequate volume   Culture  Setup Time   Final    GRAM POSITIVE COCCI IN CLUSTERS AEROBIC BOTTLE ONLY CRITICAL RESULT CALLED TO, READ BACK BY AND VERIFIED WITH: ALEX CHAPPELL @0950  ON 04/07/2019 BY FMW Performed at [facility], [address] [place],  [address]    Culture GRAM POSITIVE COCCI  Final   Report Status PENDING  Incomplete  Blood Culture (routine x 2)     Status: None (Preliminary result)   Collection Time: 04/06/19 10:55 AM   Specimen: BLOOD  Result Value Ref Range Status   Specimen Description BLOOD RAC  Final   Special Requests   Final    BOTTLES DRAWN AEROBIC AND ANAEROBIC Blood Culture results may not be optimal due to an excessive volume of blood received in culture bottles   Culture   Final    NO GROWTH 2 DAYS  Performed at [facility], [address] [place], [place] [address]    Report Status PENDING  Incomplete    Best Practice/Protocols:  VTE Prophylaxis: Heparin (drip) GI Prophylaxis: Antihistamine (famotidine 40 mg daily) [facility] COVID-19 management protocol late pulmonary phase  Events: 12/4: Diagnosed with COVID-19 12/8-12: Admitted to [facility] for management of COVID-19, received remdesivir 12/18: Presented with increasing shortness of breath and hypoxemia quiring high flow O2, bilateral pulmonary infiltrates consistent with late, pulmonary phase of COVID-19 disease.  Initiated COVID-19 management protocol for late  pulmonary phase. 12/20: Was able to tolerate some proning last night.  Oxygenation remains marginal, requiring high flow  Studies: CT Angio Chest PE W and/or Wo Contrast  Result Date: 04/06/2019 CLINICAL DATA:  COVID-19 positive with hypoxemia/shortness of breath EXAM: CT ANGIOGRAPHY CHEST WITH CONTRAST TECHNIQUE: Multidetector CT imaging of the chest was performed using the standard protocol during bolus administration of intravenous contrast. Multiplanar CT image reconstructions and MIPs were obtained to evaluate the vascular anatomy. CONTRAST:  34mL OMNIPAQUE IOHEXOL 350 MG/ML SOLN COMPARISON:  Chest CT July 07, 2012; chest radiograph April 06, 2019. FINDINGS: Cardiovascular: There is no demonstrable pulmonary embolus. There is no thoracic aortic aneurysm or dissection. The visualized great vessels appear unremarkable. There is minimal pericardial fluid, felt to be within physiologic range. There is no pericardial thickening. The main pulmonary outflow tract measures 3.6 cm in diameter, prominent. Mediastinum/Nodes: Thyroid appears unremarkable. There are multiple subcentimeter mediastinal lymph nodes. There is a lymph node to the right of the distal trachea measuring 1.2 x 1.1 cm. There is a lymph node along the superior aortopulmonary window region measuring 1.3 x 1.0 cm. A lymph node to the right of the upper carina measures 1.3 x 1.2 cm. There is a subcarinal lymph node measuring 1.5 x 1.4 cm. There is a retrocrural lymph node on the left at the gastroesophageal junction level measuring 1.2 x 1.2 cm. No esophageal lesions are appreciable. Lungs/Pleura: There is widespread airspace opacity consistent with multifocal pneumonia. Most of this infiltrate has a ground-glass type appearance, although there are scattered areas of consolidation, particularly in the superior, lateral, and posterior segments of the right lower lobe. There is no appreciable pleural effusion. Upper Abdomen: Spleen measures 16.1  x 14.8 x 9.5 cm with a measured splenic volume 1,132 mL. Gallbladder is absent. Visualized upper abdominal structures appear unremarkable. Musculoskeletal: There are foci of degenerative change in the thoracic spine. There are no blastic or lytic bone lesions. No chest wall lesions evident. Review of the MIP images confirms the above findings. IMPRESSION: 1. No demonstrable pulmonary embolus. No thoracic aortic aneurysm or dissection. 2. Widespread airspace opacity consistent with multifocal pneumonia bilaterally. Areas of consolidation noted in the right lower lobe. Other areas have a more ground-glass type appearance. Suspect much of this infiltrate is due to atypical organism etiology. 3. Areas of adenopathy, likely secondary to the extensive pneumonitis. 4. Prominence of the main pulmonary outflow tract, a finding indicative of pulmonary arterial hypertension. 5.  Splenomegaly. 6.  Gallbladder absent. Electronically Signed   By: Bretta Bang III M.D.   On: 04/06/2019 13:05   DG Chest Port 1 View  Result Date: 04/08/2019 CLINICAL DATA:  Acute respiratory failure EXAM: PORTABLE CHEST 1 VIEW COMPARISON:  April 07, 2019 at 4:46 a.m. FINDINGS: Again noted are diffuse bilateral ground-glass airspace opacities, similar to prior study. There is no pneumothorax. There may be small bilateral pleural effusions. Heart size remains enlarged. There is no acute  osseous abnormality. IMPRESSION: Persistent diffuse bilateral pulmonary opacities without significant interval change. Electronically Signed   By: Katherine Mantle M.D.   On: 04/08/2019 02:54   DG Chest Port 1 View  Result Date: 04/07/2019 CLINICAL DATA:  Recently discharged with COVID-19 pneumonia. Shortness of breath. EXAM: PORTABLE CHEST 1 VIEW COMPARISON:  April 06, 2019 FINDINGS: Diffuse bilateral pulmonary infiltrates are similar in the interval. Stable cardiomegaly. The hila and mediastinum are unchanged. No pneumothorax. IMPRESSION: No  interval change in the bilateral pulmonary infiltrates. Recommend follow-up to resolution. Electronically Signed   By: Gerome Sam III M.D   On: 04/07/2019 15:17   DG Chest Port 1 View  Result Date: 04/06/2019 CLINICAL DATA:  Pt to ED by EMS from home. Pt dx with COVID 3 weeks ago and treated at [facility] and released last Saturday. Pt continues to be SOB. Pt place on non re-breather by EMS. Hx of HTN and diabetes. Former smoker. fever, recent covid, hypoxic EXAM: PORTABLE CHEST 1 VIEW COMPARISON:  Radiograph 03/26/2019 FINDINGS: Stable enlarged cardiac silhouette. There is increased patchy airspace disease in LEFT and RIGHT lung. Airspace disease is most dense in the lateral RIGHT lung. Bibasilar atelectasis. No pneumothorax IMPRESSION: Increasing bilateral patchy airspace disease consistent with viral pneumonia. Electronically Signed   By: Genevive Bi M.D.   On: 04/06/2019 10:31   DG Chest Port 1 View  Result Date: 03/26/2019 CLINICAL DATA:  Nonproductive cough. Patient reports COVID-19 positive. EXAM: PORTABLE CHEST 1 VIEW COMPARISON:  Radiograph 05/27/2014, CT 07/07/2012 FINDINGS: Patient is rotated. Upper normal heart size with unchanged mediastinal contours allowing for rotation. Pericardial opacity corresponds to a prominent pericardial fat pad on CT. Minimal streaky bibasilar opacities. No pulmonary edema, pleural effusion or pneumothorax. No acute osseous abnormalities are seen. IMPRESSION: Minimal streaky bibasilar opacities. Findings favor atelectasis, however may represent atypical viral pneumonia in the setting of COVID-19. Electronically Signed   By: Narda Rutherford M.D.   On: 03/26/2019 23:39    Consults: Treatment Team:  Salena Saner, MD   Subjective:    Overnight Issues: Oxygenation remains marginal but she is alert and in no distress.  Tolerating high flow, switched to ICU status.  Complains of sores in her mouth.   Objective:  Vital signs for last 24  hours: Temp:  [98 F (36.7 C)-98.7 F (37.1 C)] 98.7 F (37.1 C) (12/20 0400) Pulse Rate:  [55-79] 63 (12/20 0600) Resp:  [13-34] 30 (12/20 0600) BP: (91-140)/(49-103) 101/61 (12/20 0600) SpO2:  [83 %-98 %] 95 % (12/20 0600) FiO2 (%):  [98 %-100 %] 100 % (12/19 1942)  Hemodynamic parameters for last 24 hours:    Intake/Output from previous day: 12/19 0701 - 12/20 0700 In: 1577.2 [I.V.:1486; IV Piggyback:91.2] Out: 2950 [Urine:2950]  Intake/Output this shift: No intake/output data recorded.  Vent settings for last 24 hours: FiO2 (%):  [98 %-100 %] 100 %  Physical Exam:  Constitutional: well-developed, morbidly obese.  No distress, high flow O2 cannula in place. HEENT: Normocephalic and atraumatic.  Moist mucous membranes, mild thrush PERRL. Conjunctivae are normal. No scleral icterus.  NECK: Thick, no tracheal deviation.  Cardiovascular: Normal rate and regular rhythm on monitor.  Respiratory:  Nonlabored.  Limited exam due to full PPE/CAPR  GI: Soft.No distension. No abdominal tenderness. Protruberant/obese  Genitourinary: Foley in place Musculoskeletal: Trace to no edema lower extremities    Neurological:   Marked improvement, appears comfortable, no overt localizing deficits. Skin: warm, dry and intact.  Psychiatric:  Appears calm, cooperative  not distressed.   Assessment/Plan:   Acute hypoxic respiratory failure Late pulmonary phase of COVID-19 disease Bilateral pulmonary infiltrates Low suspicion for bacterial infection as procalcitonin and clinical picture not suggestive  High flow O2 to maintain oxygen saturations between 84 to 92% (permissive hypoxemia) Continue anti-inflammatory therapy Though steroids recommended, severe hyperglycemia precludes use as she would need drip and in the interest of conserving PPE will try to avoid this Initiate high-dose inhaled corticosteroids Utilize colchicine as anti-inflammatory Ivermectin as anti-inflammatory repeat today  received 2 doses Supplemented magnesium yesterday, continue to trend Supplement Zinc and Thiamine, continue vitamin C Close monitoring in ICU High risk for intubation, self proning as tolerated, IS  Hypercoagulable state No evidence of large PE Cannot exclude microthrombi Heparin infusion which is recommended in the late pulmonary phase Anti-inflammatory therapy as above  Diabetes mellitus type 2 Poor control Avoid corticosteroids for now Subcu insulin Close monitoring  Acute kidney injury Likely ATN Avoid nephrotoxins She was volume resuscitated yesterday Follow renal panel Monitor I's and O's  Rheumatoid arthritis Recent Actemra dosing This issue adds complexity to her management Actemra discontinued 12/18 by her rheumatologist Received last Actemra dose in the week PTA  Morbid obesity BMI 52 This issue adds complexity to her management    LOS: 2 days   Additional comments: Patient has been switched to ICU level care, PCCM is assuming primary care  Critical Care Total Time*:   C. Danice GoltzLaura Sarahmarie Leavey, MD [place] [facility] 04/08/2019  *Care during the described time interval was provided by me and/or other providers on the critical care team.  I have reviewed this patient's available data, including medical history, events of note, physical examination and test results as part of my evaluation.  **This note was dictated using voice recognition software/Dragon.  Despite best efforts to proofread, errors can occur which can change the meaning.  Any change was purely unintentional.

## 2019-04-08 NOTE — Consult Note (Signed)
ANTICOAGULATION CONSULT NOTE  Pharmacy Consult for Heparin Infusion Indication: Hypercoagable state 2/2 COVID disease  Allergies  Allergen Reactions  . Cephalexin Hives  . Codeine Palpitations, Nausea Only, Nausea And Vomiting, Rash and Shortness Of Breath    "makes heart fly, she gets flushed and passes out"  . Doxycycline Rash  . Propoxyphene Rash and Shortness Of Breath    Increase heart rate  . Sulfa Antibiotics Palpitations, Nausea Only, Shortness Of Breath and Hives    "makes heart fly, she gets flushed and passes out"  . Lovenox [Enoxaparin Sodium] Hives  . Hydrocodone Nausea And Vomiting    Hear racing & breaks out into a cold sweat.  . Meropenem Rash    Erythematous, hot, pruritic rash over arms, chest, back, abdomen, and face occurred at the end of meropenem infusion on 02/22/18    Patient Measurements: Height: 5\' 6"  (167.6 cm) Weight: (!) 320 lb (145.2 kg) IBW/kg (Calculated) : 59.3 Heparin Dosing Weight: 95.4  Vital Signs: Temp: 98.7 F (37.1 C) (12/20 1600) Temp Source: Oral (12/20 1600) BP: 91/63 (12/20 1500) Pulse Rate: 69 (12/20 1600)  Labs: Recent Labs    04/06/19 1010 04/07/19 0422 04/07/19 0613 04/07/19 0837 04/08/19 0523 04/08/19 0745 04/08/19 1609  HGB 10.1* 10.0*  --   --  10.5*  --   --   HCT 31.1* 29.7*  --   --  29.3*  --   --   PLT 152 175  --   --  197  --   --   APTT 38*  --   --   --   --   --   --   LABPROT 15.8*  --   --   --   --   --   --   INR 1.3*  --   --   --   --   --   --   HEPARINUNFRC  --   --   --  0.50  --  0.24* 0.67  CREATININE 1.32* 1.09* 1.05*  --  1.06*  --   --   TROPONINIHS  --   --  3 3  --   --   --     Estimated Creatinine Clearance: 86.6 mL/min (A) (by C-G formula based on SCr of 1.06 mg/dL (H)).   Medical History: Past Medical History:  Diagnosis Date  . Abdominal wall hernia 01/29/2013  . Anxiety   . Arthritis    Rheumatoid  . C. difficile colitis   . Chronic diastolic heart failure ([place])   .  Depression   . Diabetes mellitus    states no meds or diet restrictions  at present  . Diastolic CHF ([place])   . Esophagitis   . Fluid retention   . GERD (gastroesophageal reflux disease)   . Hiatal hernia   . Hypertension   . Hypokalemia due to loss of potassium 10/21/2015   Overview:  Associated with 3 weeks of diarrhea  And QT prolongation.  . Hypothyroidism   . IBS (irritable bowel syndrome)   . Moderate episode of recurrent major depressive disorder ([place]) 06/03/2004  . Morbid obesity ([place])   . MRSA (methicillin resistant Staphylococcus aureus) infection 11/2017   left inner thigh abcess  . Neurogenic bladder    has pacemaker  . Neuropathy   . Obesity   . Panic attacks   . Rheumatoid arthritis ([place])   . Sleep apnea    STATES SEVERE, CANT TOLERATE MASK- LAST STUDY YEARS AGO  Medications:  No anticoagulation prior to admission per medication reconcilation  Assessment: 58 y/o F with history of anxiety, diabetes, gastric reflux, hypertension, CHF, obesity who presented to [facility] ED 12/18 with shortness of breath. Previous COVID diagnosis for which she was admitted to [facility] [place] 12/8-12/12. D dimer elevated to 1405. CXR with bilateral patchy infiltration. CTA negative for PE. Pharmacy has been consulted to initiate heparin drip for hypercoagulable state secondary to COVID disease.  Heparin infusing at 1700 units/hr.   Goal of Therapy:  Heparin level 0.3-0.7 units/ml Monitor platelets by anticoagulation protocol: Yes   Plan:  12/20@ 1609 HL 0.67. Therapeutic s/p bolus and rate increase. Will continue current heparin rate of 1700 units/hr.   Will obtain anti-Xa level at 2200. CBC with am labs.   Pharmacy will continue to monitor and adjust per consult.   Cephus Shelling, PharmD Clinical Pharmacist 04/08/2019,4:48 PM

## 2019-04-08 NOTE — Progress Notes (Signed)
PROGRESS NOTE    [name] [name]  WUJ:[id number] DOB: 1960/11/24 DOA: 04/06/2019 PCP: [facility]      Brief Narrative:  [name] [name] is a 58 y.o. F with obesity, DM, RA on Actemra, dCHF, hx Cdiff, HTN, hypothyroidism, depression, neurogenic bladder with bladder pacer, and recent COVID-19 who presented with dyspnea.  [facility] for [facility], treated with 5 days remdesivir, weaned to room air at discharge.  After discharge, she did have 1 emergency room visit because she could not get her dexamethasone, had increasing SLB, although her O2 sat was good at that visit.  Since she has been at home, but has had progressive dyspnea, new fever to 103.  EMS found her hypoxic to the 70s and put her on NRB.    In the ER, CTA showed no PE, but did show multifocal pneumonia, mostly groundglass.  She was quite hypoxic, to the 70s on nonrebreather.  CCM were consulted.          Assessment & Plan:  Acute hypoxic respiratory failure due to post-COVID inflammation Lactate 3.9 on admission, due to acute hypoxic respiratory failure.  Sepsis was not suspected by admitting physician or critical care.  Suspected contribution of pulmonary microthrombi. Inflammatory markers improving, still severely dyspneic. -HFNC or intubation per Pulmonology -Anti-inflammatories per Pulmonology -Empiric anticoagulation per pulmonology  Heart failure Has hx diastolic CHF, at present doesn't appear fluid overloaded. Net negative 1.9L yesterday, Cr stable. -Hold fluids -Strict I/Os  AKI Baseline creatinine 0.9, admission creatinine 1.3.  Stable back to baseline.  Diabetes Severely hyperglycemic at admission.   Glucoses improved yesterday afternoon, elevated this morning -Continue Lantus -Continue sliding scale corrections -Diabetic counselor consulted -Hold atorvastatin, Metformin -Start linagliptin  Hypertension BP soft  Hypothyroidism -Continue levothyroxine  Neurogenic  bladder -Continue darifenacin  Depression -Continue Abilify, fluoxetine, BuSpar, Seroquel  Rheumatoid arthritis On Actemra at baseline -Hold methotrexate  BMI 52 -Continue famotidine  Chronic pain syndrome -Continue oxycodone, Lyrica, Mirapex         MDM and disposition: The below labs and imaging reports reviewed and summarized above.  Medication management as above.   The patient was admitted with admitted with acute hypoxic respiratory failure.    Dr. Jayme CloudGonzalez has graciously assumed attending for this patient given her complex and severe pulmonary disease and need for ICU level care.  We will continue to follow, and provide assistance with medical management of her chronic diastolic heart failure, diabetes, obesity, RA, depression and chronic pain as needed.         DVT prophylaxis: Not applicable, on heparin infusion Code Status: Full code Family Communication:      Procedures:   12/18 CT angiogram chest-no PE, multifocal pneumonia  Antimicrobials:   Levaquin 12/18 x 1  Vancomycin 12/18 x 1  Ivermectin 12/18 x 1   Subjective: She remains very dyspneic.  Has nausea.  The heater on her high flow nasal cannula has stopped working and so she is very uncomfortable in the cold air in her nose.  No sputum, hemoptysis.  No dysuria, hematuria, rectal bleeding, hematemesis, melena.  Objective: Vitals:   04/08/19 0300 04/08/19 0400 04/08/19 0500 04/08/19 0600  BP: (!) 116/103 125/66 118/73 101/61  Pulse: 76 79 65 63  Resp: (!) 30 (!) 25 (!) 34 (!) 30  Temp:  98.7 F (37.1 C)    TempSrc:  Oral    SpO2: 98% (!) 87% 95% 95%  Weight:      Height:  Intake/Output Summary (Last 24 hours) at 04/08/2019 0943 Last data filed at 04/08/2019 0617 Gross per 24 hour  Intake 577.18 ml  Output 2250 ml  Net -1672.82 ml   Filed Weights   04/06/19 1000  Weight: (!) 145.2 kg    Examination:  General appearance: Obese adult female, alert and in moderate  respiratory distress.  Sitting up in recliner.  Has high flow nasal cannula and nonrebreather on face. HEENT:     Skin: Warm and dry.  No suspicious rashes or lesions. Cardiac: Tachycardic, regular, no lower extremity pitting.    Respiratory: Tachypneic, respirations shallow, lung sounds diminished.  I do not appreciate wheezing. Abdomen: Abdomen soft.  No ascites, distension, hepatosplenomegaly.   MSK: No deformities or effusions of the large joints of the upper or lower extremities bilaterally. Neuro: Awake and alert, but very fatigued. Naming is grossly intact, and the patient's recall, recent and remote, as well as general fund of knowledge seem within normal limits.  Muscle tone diminished, without fasciculations.  Moves all extremities with symmetry with generalized weakness. Speech fluent.    Psych: Sensorium intact and responding to questions, attention diminished, affect blunted, judgment insight appear normal.        Data Reviewed: I have personally reviewed following labs and imaging studies:  CBC: Recent Labs  Lab 04/01/19 2152 04/06/19 1010 04/07/19 0422 04/08/19 0523  WBC 4.5 5.0 5.6 7.4  NEUTROABS 3.3 4.3 4.9 6.7  HGB 12.0 10.1* 10.0* 10.5*  HCT 35.9* 31.1* 29.7* 29.3*  MCV 78.2* 81.2 77.1* 80.7  PLT 160 152 175 197   Basic Metabolic Panel: Recent Labs  Lab 04/01/19 2152 04/06/19 1010 04/07/19 0422 04/07/19 0613 04/07/19 0837 04/08/19 0523  NA 136 131* 135  --   --  138  K 3.7 3.9 4.5  --   --  4.0  CL 103 97* 103  --   --  102  CO2 21* 20* 21*  --   --  28  GLUCOSE 258* 577* 364*  --   --  182*  BUN 15 33* 31*  --   --  29*  CREATININE 0.89 1.32* 1.09* 1.05*  --  1.06*  CALCIUM 8.8* 8.5* 8.6*  --   --  9.1  MG  --  1.9  --   --  1.4* 2.1   GFR: Estimated Creatinine Clearance: 86.6 mL/min (A) (by C-G formula based on SCr of 1.06 mg/dL (H)). Liver Function Tests: Recent Labs  Lab 04/01/19 2152 04/06/19 1010 04/07/19 0422 04/08/19 0523  AST 24 26  32 53*  ALT 26 16 17 24   ALKPHOS 62 51 50 51  BILITOT 0.9 0.8 0.6 0.7  PROT 7.7 6.8 6.8 7.0  ALBUMIN 3.6 2.5* 2.4* 2.5*   No results for input(s): LIPASE, AMYLASE in the last 168 hours. No results for input(s): AMMONIA in the last 168 hours. Coagulation Profile: Recent Labs  Lab 04/06/19 1010  INR 1.3*   Cardiac Enzymes: No results for input(s): CKTOTAL, CKMB, CKMBINDEX, TROPONINI in the last 168 hours. BNP (last 3 results) No results for input(s): PROBNP in the last 8760 hours. HbA1C: No results for input(s): HGBA1C in the last 72 hours. CBG: Recent Labs  Lab 04/06/19 2300 04/07/19 0810 04/07/19 1236 04/07/19 2134 04/08/19 0826  GLUCAP 392* 350* 249* 174* 351*   Lipid Profile: No results for input(s): CHOL, HDL, LDLCALC, TRIG, CHOLHDL, LDLDIRECT in the last 72 hours. Thyroid Function Tests: No results for input(s): TSH, T4TOTAL, FREET4, T3FREE, THYROIDAB  in the last 72 hours. Anemia Panel: Recent Labs    04/06/19 1010 04/08/19 0523  FERRITIN 1,097* 1,051*   Urine analysis:    Component Value Date/Time   COLORURINE YELLOW (A) 04/06/2019 2118   APPEARANCEUR CLOUDY (A) 04/06/2019 2118   APPEARANCEUR Clear 11/30/2013 1010   LABSPEC 1.040 (H) 04/06/2019 2118   LABSPEC 1.015 11/30/2013 1010   PHURINE 6.0 04/06/2019 2118   GLUCOSEU >=500 (A) 04/06/2019 2118   GLUCOSEU >=500 11/30/2013 1010   HGBUR NEGATIVE 04/06/2019 2118   BILIRUBINUR NEGATIVE 04/06/2019 2118   BILIRUBINUR Negative 11/30/2013 1010   KETONESUR NEGATIVE 04/06/2019 2118   PROTEINUR 30 (A) 04/06/2019 2118   UROBILINOGEN 0.2 03/14/2007 1000   NITRITE NEGATIVE 04/06/2019 2118   LEUKOCYTESUR SMALL (A) 04/06/2019 2118   LEUKOCYTESUR Negative 11/30/2013 1010   Sepsis Labs: @LABRCNTIP (procalcitonin:4,lacticacidven:4)  ) Recent Results (from the past 240 hour(s))  Blood culture (routine x 2)     Status: None   Collection Time: 04/01/19  9:52 PM   Specimen: BLOOD  Result Value Ref Range Status    Specimen Description BLOOD RIGHT ANTECUBITAL  Final   Special Requests   Final    BOTTLES DRAWN AEROBIC AND ANAEROBIC Blood Culture results may not be optimal due to an inadequate volume of blood received in culture bottles   Culture   Final    NO GROWTH 5 DAYS Performed at [facility], [address] [place], [place] [address]    Report Status 04/06/2019 FINAL  Final  Blood culture (routine x 2)     Status: None   Collection Time: 04/01/19  9:52 PM   Specimen: BLOOD  Result Value Ref Range Status   Specimen Description BLOOD LEFT ANTECUBITAL  Final   Special Requests   Final    BOTTLES DRAWN AEROBIC AND ANAEROBIC Blood Culture adequate volume   Culture   Final    NO GROWTH 5 DAYS Performed at [facility], [address] [place], [place] [address]    Report Status 04/06/2019 FINAL  Final  MRSA PCR Screening     Status: Abnormal   Collection Time: 04/06/19  9:48 AM   Specimen: Nasopharyngeal  Result Value Ref Range Status   MRSA by PCR POSITIVE (A) NEGATIVE Final    Comment:        The GeneXpert MRSA Assay (FDA approved for NASAL specimens only), is one component of a comprehensive MRSA colonization surveillance program. It is not intended to diagnose MRSA infection nor to guide or monitor treatment for MRSA infections. RESULT CALLED TO, READ BACK BY AND VERIFIED WITH: JAMIE DEEM @ 4034 ON 04/07/2019 BY CAF Performed at [facility], [address] [place], [place] [address]   Blood Culture (routine x 2)     Status: None (Preliminary result)   Collection Time: 04/06/19 10:10 AM   Specimen: BLOOD  Result Value Ref Range Status   Specimen Description BLOOD RAC  Final   Special Requests   Final    BOTTLES DRAWN AEROBIC AND ANAEROBIC Blood Culture adequate volume   Culture  Setup Time   Final    GRAM POSITIVE COCCI IN CLUSTERS AEROBIC BOTTLE ONLY CRITICAL RESULT CALLED TO, READ BACK BY AND VERIFIED WITH: Ronan @0950  ON 04/07/2019  BY FMW Performed at [facility], [address], [place],  [address]    Culture [facility] POSITIVE COCCI  Final   Report Status PENDING  Incomplete  Blood Culture (routine x 2)  Status: None (Preliminary result)   Collection Time: 04/06/19 10:55 AM   Specimen: BLOOD  Result Value Ref Range Status   Specimen Description BLOOD RAC  Final   Special Requests   Final    BOTTLES DRAWN AEROBIC AND ANAEROBIC Blood Culture results may not be optimal due to an excessive volume of blood received in culture bottles   Culture   Final    NO GROWTH 2 DAYS Performed at [facility], [address] [place], [place] [address]    Report Status PENDING  Incomplete         Radiology Studies: CT Angio Chest PE W and/or Wo Contrast  Result Date: 04/06/2019 CLINICAL DATA:  COVID-19 positive with hypoxemia/shortness of breath EXAM: CT ANGIOGRAPHY CHEST WITH CONTRAST TECHNIQUE: Multidetector CT imaging of the chest was performed using the standard protocol during bolus administration of intravenous contrast. Multiplanar CT image reconstructions and MIPs were obtained to evaluate the vascular anatomy. CONTRAST:  64mL OMNIPAQUE IOHEXOL 350 MG/ML SOLN COMPARISON:  Chest CT July 07, 2012; chest radiograph April 06, 2019. FINDINGS: Cardiovascular: There is no demonstrable pulmonary embolus. There is no thoracic aortic aneurysm or dissection. The visualized great vessels appear unremarkable. There is minimal pericardial fluid, felt to be within physiologic range. There is no pericardial thickening. The main pulmonary outflow tract measures 3.6 cm in diameter, prominent. Mediastinum/Nodes: Thyroid appears unremarkable. There are multiple subcentimeter mediastinal lymph nodes. There is a lymph node to the right of the distal trachea measuring 1.2 x 1.1 cm. There is a lymph node along the superior aortopulmonary window region measuring 1.3 x 1.0 cm. A lymph node to the right of the upper  carina measures 1.3 x 1.2 cm. There is a subcarinal lymph node measuring 1.5 x 1.4 cm. There is a retrocrural lymph node on the left at the gastroesophageal junction level measuring 1.2 x 1.2 cm. No esophageal lesions are appreciable. Lungs/Pleura: There is widespread airspace opacity consistent with multifocal pneumonia. Most of this infiltrate has a ground-glass type appearance, although there are scattered areas of consolidation, particularly in the superior, lateral, and posterior segments of the right lower lobe. There is no appreciable pleural effusion. Upper Abdomen: Spleen measures 16.1 x 14.8 x 9.5 cm with a measured splenic volume 1,132 mL. Gallbladder is absent. Visualized upper abdominal structures appear unremarkable. Musculoskeletal: There are foci of degenerative change in the thoracic spine. There are no blastic or lytic bone lesions. No chest wall lesions evident. Review of the MIP images confirms the above findings. IMPRESSION: 1. No demonstrable pulmonary embolus. No thoracic aortic aneurysm or dissection. 2. Widespread airspace opacity consistent with multifocal pneumonia bilaterally. Areas of consolidation noted in the right lower lobe. Other areas have a more ground-glass type appearance. Suspect much of this infiltrate is due to atypical organism etiology. 3. Areas of adenopathy, likely secondary to the extensive pneumonitis. 4. Prominence of the main pulmonary outflow tract, a finding indicative of pulmonary arterial hypertension. 5.  Splenomegaly. 6.  Gallbladder absent. Electronically Signed   By: Bretta Bang III M.D.   On: 04/06/2019 13:05   DG Chest Port 1 View  Result Date: 04/08/2019 CLINICAL DATA:  Acute respiratory failure EXAM: PORTABLE CHEST 1 VIEW COMPARISON:  April 07, 2019 at 4:46 a.m. FINDINGS: Again noted are diffuse bilateral ground-glass airspace opacities, similar to prior study. There is no pneumothorax. There may be small bilateral pleural effusions. Heart  size remains enlarged. There is no acute osseous abnormality. IMPRESSION: Persistent diffuse bilateral pulmonary opacities without  significant interval change. Electronically Signed   By: Katherine Mantle M.D.   On: 04/08/2019 02:54   DG Chest Port 1 View  Result Date: 04/07/2019 CLINICAL DATA:  Recently discharged with COVID-19 pneumonia. Shortness of breath. EXAM: PORTABLE CHEST 1 VIEW COMPARISON:  April 06, 2019 FINDINGS: Diffuse bilateral pulmonary infiltrates are similar in the interval. Stable cardiomegaly. The hila and mediastinum are unchanged. No pneumothorax. IMPRESSION: No interval change in the bilateral pulmonary infiltrates. Recommend follow-up to resolution. Electronically Signed   By: Gerome Sam III M.D   On: 04/07/2019 15:17   DG Chest Port 1 View  Result Date: 04/06/2019 CLINICAL DATA:  Pt to ED by EMS from home. Pt dx with COVID 3 weeks ago and treated at [facility] and released last Saturday. Pt continues to be SOB. Pt place on non re-breather by EMS. Hx of HTN and diabetes. Former smoker. fever, recent covid, hypoxic EXAM: PORTABLE CHEST 1 VIEW COMPARISON:  Radiograph 03/26/2019 FINDINGS: Stable enlarged cardiac silhouette. There is increased patchy airspace disease in LEFT and RIGHT lung. Airspace disease is most dense in the lateral RIGHT lung. Bibasilar atelectasis. No pneumothorax IMPRESSION: Increasing bilateral patchy airspace disease consistent with viral pneumonia. Electronically Signed   By: Genevive Bi M.D.   On: 04/06/2019 10:31        Scheduled Meds: . ARIPiprazole  5 mg Oral Daily  . vitamin C  500 mg Oral Daily  . atorvastatin  80 mg Oral q1800  . budesonide  4 puff Inhalation BID  . busPIRone  10 mg Oral BID  . calcium-vitamin D  1 tablet Oral Daily  . Chlorhexidine Gluconate Cloth  6 each Topical Daily  . cholecalciferol  1,000 Units Oral Q breakfast  . colchicine  0.6 mg Oral Daily  . darifenacin  7.5 mg Oral Daily  . famotidine  40  mg Oral Daily  . FLUoxetine  20 mg Oral Daily  . folic acid  1 mg Oral Daily  . guaiFENesin-dextromethorphan  5 mL Oral BID  . insulin aspart  0-20 Units Subcutaneous TID WC  . insulin aspart  0-5 Units Subcutaneous QHS  . insulin glargine  65 Units Subcutaneous Daily  . ipratropium  2 puff Inhalation Q4H  . levothyroxine  88 mcg Oral QAC breakfast  . pramipexole  0.125 mg Oral BID  . pregabalin  150 mg Oral TID  . QUEtiapine  300 mg Oral QHS  . zinc sulfate  220 mg Oral Daily  . zolpidem  5 mg Oral QHS   Continuous Infusions: . heparin 1,700 Units/hr (04/08/19 0916)     LOS: 2 days    Time spent: 25 minutes    Alberteen Sam, MD [facility] 04/08/2019, 9:43 AM     Please page though AMION or Epic secure chat:  For [employer], Higher education careers adviser

## 2019-04-09 LAB — COMPREHENSIVE METABOLIC PANEL
ALT: 20 U/L (ref 0–44)
AST: 29 U/L (ref 15–41)
Albumin: 2.3 g/dL — ABNORMAL LOW (ref 3.5–5.0)
Alkaline Phosphatase: 44 U/L (ref 38–126)
Anion gap: 13 (ref 5–15)
BUN: 39 mg/dL — ABNORMAL HIGH (ref 6–20)
CO2: 20 mmol/L — ABNORMAL LOW (ref 22–32)
Calcium: 8.6 mg/dL — ABNORMAL LOW (ref 8.9–10.3)
Chloride: 104 mmol/L (ref 98–111)
Creatinine, Ser: 1.17 mg/dL — ABNORMAL HIGH (ref 0.44–1.00)
GFR calc Af Amer: 60 mL/min — ABNORMAL LOW (ref >60)
GFR calc non Af Amer: 52 mL/min — ABNORMAL LOW (ref >60)
Glucose, Bld: 186 mg/dL — ABNORMAL HIGH (ref 70–99)
Potassium: 3.8 mmol/L (ref 3.5–5.1)
Sodium: 137 mmol/L (ref 135–145)
Total Bilirubin: 0.6 mg/dL (ref 0.3–1.2)
Total Protein: 6.7 g/dL (ref 6.5–8.1)

## 2019-04-09 LAB — CBC WITH DIFFERENTIAL/PLATELET
Abs Immature Granulocytes: 0.03 10*3/uL (ref 0.00–0.07)
Basophils Absolute: 0 10*3/uL (ref 0.0–0.1)
Basophils Relative: 0 %
Eosinophils Absolute: 0 10*3/uL (ref 0.0–0.5)
Eosinophils Relative: 0 %
HCT: 27 % — ABNORMAL LOW (ref 36.0–46.0)
Hemoglobin: 9.7 g/dL — ABNORMAL LOW (ref 12.0–15.0)
Immature Granulocytes: 1 %
Lymphocytes Relative: 20 %
Lymphs Abs: 0.9 10*3/uL (ref 0.7–4.0)
MCH: 29.2 pg (ref 26.0–34.0)
MCHC: 35.9 g/dL (ref 30.0–36.0)
MCV: 81.3 fL (ref 80.0–100.0)
Monocytes Absolute: 0.3 10*3/uL (ref 0.1–1.0)
Monocytes Relative: 6 %
Neutro Abs: 3.3 10*3/uL (ref 1.7–7.7)
Neutrophils Relative %: 73 %
Platelets: 231 10*3/uL (ref 150–400)
RBC: 3.32 MIL/uL — ABNORMAL LOW (ref 3.87–5.11)
RDW: 15.4 % (ref 11.5–15.5)
Smear Review: NORMAL
WBC: 4.4 10*3/uL (ref 4.0–10.5)
nRBC: 0 % (ref 0.0–0.2)

## 2019-04-09 LAB — CULTURE, BLOOD (ROUTINE X 2): Special Requests: ADEQUATE

## 2019-04-09 LAB — HEPARIN LEVEL (UNFRACTIONATED)
Heparin Unfractionated: 0.35 IU/mL (ref 0.30–0.70)
Heparin Unfractionated: 0.76 IU/mL — ABNORMAL HIGH (ref 0.30–0.70)
Heparin Unfractionated: 0.47 IU/mL (ref 0.30–0.70)

## 2019-04-09 LAB — PATHOLOGIST SMEAR REVIEW

## 2019-04-09 LAB — FIBRIN DERIVATIVES D-DIMER (ARMC ONLY): Fibrin derivatives D-dimer (ARMC): 1056.26 ng/mL (FEU) — ABNORMAL HIGH (ref 0.00–499.00)

## 2019-04-09 LAB — FERRITIN: Ferritin: 631 ng/mL — ABNORMAL HIGH (ref 11–307)

## 2019-04-09 LAB — GLUCOSE, CAPILLARY
Glucose-Capillary: 142 mg/dL — ABNORMAL HIGH (ref 70–99)
Glucose-Capillary: 134 mg/dL — ABNORMAL HIGH (ref 70–99)
Glucose-Capillary: 124 mg/dL — ABNORMAL HIGH (ref 70–99)
Glucose-Capillary: 88 mg/dL (ref 70–99)

## 2019-04-09 LAB — MAGNESIUM: Magnesium: 2.1 mg/dL (ref 1.7–2.4)

## 2019-04-09 LAB — C-REACTIVE PROTEIN: CRP: 6.1 mg/dL — ABNORMAL HIGH (ref <1.0)

## 2019-04-09 MED ORDER — FUROSEMIDE 10 MG/ML IJ SOLN
60.0000 mg | Freq: Once | INTRAMUSCULAR | Status: AC
  Administered 2019-04-09: 60 mg via INTRAVENOUS
  Filled 2019-04-09: qty 6

## 2019-04-09 MED ORDER — INSULIN ASPART 100 UNIT/ML ~~LOC~~ SOLN
6.0000 [IU] | Freq: Three times a day (TID) | SUBCUTANEOUS | Status: DC
  Administered 2019-04-10 – 2019-04-20 (×28): 6 [IU] via SUBCUTANEOUS
  Filled 2019-04-09 (×24): qty 1

## 2019-04-09 NOTE — Progress Notes (Signed)
Follow up - Critical Care Medicine Note  Patient Details:    [name] [name] is an 58 y.o. female former smoker with a history of diabetes mellitus, morbid obesity and rheumatoid arthritis diagnosed with COVID-19 on December 4.  Admitted to [facility] from 8 December through 12 December received 5 days of remdesivir.  Also received dexamethasone.  She has been found to be on the late pulmonary phase of COVID-19 infection marked by intense inflammatory response.  Lines, Airways, Drains: External Urinary Catheter (Active)  Collection Container Dedicated Suction Canister 04/07/19 1400  Site Assessment Clean;Intact 04/07/19 1400    Anti-infectives:  Anti-infectives (From admission, onward)   Start     Dose/Rate Route Frequency Ordered Stop   04/08/19 0500  fluconazole (DIFLUCAN) tablet 150 mg     150 mg Oral  Once 04/08/19 0453 04/08/19 0609   04/07/19 1545  ivermectin (STROMECTOL) tablet 28,500 mcg     200 mcg/kg  145.2 kg Oral  Once 04/07/19 1540 04/07/19 2048   04/06/19 2300  ivermectin (STROMECTOL) tablet 18,000 mcg     200 mcg/kg  93.7 kg (Adjusted) Oral  Once 04/06/19 2008 04/06/19 2356   04/06/19 1115  levofloxacin (LEVAQUIN) IVPB 750 mg     750 mg 100 mL/hr over 90 Minutes Intravenous  Once 04/06/19 1108 04/06/19 1346   04/06/19 1115  vancomycin (VANCOCIN) IVPB 1000 mg/200 mL premix     1,000 mg 200 mL/hr over 60 Minutes Intravenous  Once 04/06/19 1108 04/06/19 1550       CC follow up severe hypoxic resp failure  HPI Severe hypoxia On high flow [place]   FiO2 (%):  [100 %] 100 % BP 118/66   Pulse 63   Temp (!) 97.5 F (36.4 C) (Axillary)   Resp 20   Ht 5\' 6"  (1.676 m)   Wt (!) 145.2 kg   LMP 04/20/2001   SpO2 (!) 85%   BMI 51.65 kg/m    COVID-19 DISASTER DECLARATION:   FULL CONTACT PHYSICAL EXAMINATION WAS NOT POSSIBLE DUE TO TREATMENT OF COVID-19 AND   CONSERVATION OF PERSONAL PROTECTIVE EQUIPMENT, LIMITED EXAM FINDINGS INCLUDE-   Patient assessed  or the symptoms described in the history of present illness.   In the context of the Global COVID-19 pandemic, which necessitated consideration that the patient might be at risk for infection with the SARS-CoV-2 virus that causes COVID-19, Institutional protocols and algorithms that pertain to the evaluation of patients at risk for COVID-19 are in a state of rapid change based on information released by regulatory bodies including the CDC and federal and state organizations. These policies and algorithms were followed during the patient's care while in hospital.         Microbiology: Results for orders placed or performed during the hospital encounter of 04/06/19  MRSA PCR Screening     Status: Abnormal   Collection Time: 04/06/19  9:48 AM   Specimen: Nasopharyngeal  Result Value Ref Range Status   MRSA by PCR POSITIVE (A) NEGATIVE Final    Comment:        The GeneXpert MRSA Assay (FDA approved for NASAL specimens only), is one component of a comprehensive MRSA colonization surveillance program. It is not intended to diagnose MRSA infection nor to guide or monitor treatment for MRSA infections. RESULT CALLED TO, READ BACK BY AND VERIFIED WITH: JAMIE DEEM @ 1645 ON 04/07/2019 BY CAF Performed at [facility], [address], [place], [place]   Blood Culture (routine  x 2)     Status: Abnormal (Preliminary result)   Collection Time: 04/06/19 10:10 AM   Specimen: BLOOD  Result Value Ref Range Status   Specimen Description   Final    BLOOD RAC Performed at [facility], [address], [place], [place] [address]    Special Requests   Final    BOTTLES DRAWN AEROBIC AND ANAEROBIC Blood Culture adequate volume Performed at [facility], [address] [place], [place] [address]    Culture  Setup Time   Final    GRAM POSITIVE COCCI IN CLUSTERS AEROBIC BOTTLE ONLY CRITICAL RESULT CALLED TO, READ BACK BY AND VERIFIED WITH: ALEX CHAPPELL  @0950  ON 04/07/2019 BY FMW Performed at [facility], [address] [place], [place] [address]    Culture (A)  Final    STAPHYLOCOCCUS SPECIES (COAGULASE NEGATIVE) THE SIGNIFICANCE OF ISOLATING THIS ORGANISM FROM A SINGLE SET OF BLOOD CULTURES WHEN MULTIPLE SETS ARE DRAWN IS UNCERTAIN. PLEASE NOTIFY THE MICROBIOLOGY DEPARTMENT WITHIN ONE WEEK IF SPECIATION AND SENSITIVITIES ARE REQUIRED. Performed at [facility], [address], [place], [place] [address]    Report Status PENDING  Incomplete  Blood Culture (routine x 2)     Status: None (Preliminary result)   Collection Time: 04/06/19 10:55 AM   Specimen: BLOOD  Result Value Ref Range Status   Specimen Description BLOOD RAC  Final   Special Requests   Final    BOTTLES DRAWN AEROBIC AND ANAEROBIC Blood Culture results may not be optimal due to an excessive volume of blood received in culture bottles   Culture   Final    NO GROWTH 2 DAYS Performed at [facility], [address], [place], [place] [address]    Report Status PENDING  Incomplete  Urine culture     Status: Abnormal   Collection Time: 04/06/19  9:18 PM   Specimen: In/Out Cath Urine  Result Value Ref Range Status   Specimen Description   Final    IN/OUT CATH URINE Performed at [facility], [address] [place], [place] [address]    Special Requests   Final    NONE Performed at [facility], [address] [place], [place] [address]    Culture >=100,000 COLONIES/mL YEAST (A)  Final   Report Status 04/08/2019 FINAL  Final    Best Practice/Protocols:  VTE Prophylaxis: Heparin (drip) GI Prophylaxis: Antihistamine (famotidine 40 mg daily) [facility] COVID-19 management protocol late pulmonary phase  Events: 12/4: Diagnosed with COVID-19 12/8-12: Admitted to [facility] for management of COVID-19, received remdesivir 12/18: Presented with increasing shortness of breath and hypoxemia quiring high flow O2,  bilateral pulmonary infiltrates consistent with late, pulmonary phase of COVID-19 disease.  Initiated COVID-19 management protocol for late pulmonary phase. 12/20: Was able to tolerate some proning last night.  Oxygenation remains marginal, requiring high flow  Studies: CT Angio Chest PE W and/or Wo Contrast  Result Date: 04/06/2019 CLINICAL DATA:  COVID-19 positive with hypoxemia/shortness of breath EXAM: CT ANGIOGRAPHY CHEST WITH CONTRAST TECHNIQUE: Multidetector CT imaging of the chest was performed using the standard protocol during bolus administration of intravenous contrast. Multiplanar CT image reconstructions and MIPs were obtained to evaluate the vascular anatomy. CONTRAST:  44mL OMNIPAQUE IOHEXOL 350 MG/ML SOLN COMPARISON:  Chest CT July 07, 2012; chest radiograph April 06, 2019. FINDINGS: Cardiovascular: There is no demonstrable pulmonary embolus. There is no thoracic aortic aneurysm or dissection. The visualized great vessels appear unremarkable.  There is minimal pericardial fluid, felt to be within physiologic range. There is no pericardial thickening. The main pulmonary outflow tract measures 3.6 cm in diameter, prominent. Mediastinum/Nodes: Thyroid appears unremarkable. There are multiple subcentimeter mediastinal lymph nodes. There is a lymph node to the right of the distal trachea measuring 1.2 x 1.1 cm. There is a lymph node along the superior aortopulmonary window region measuring 1.3 x 1.0 cm. A lymph node to the right of the upper carina measures 1.3 x 1.2 cm. There is a subcarinal lymph node measuring 1.5 x 1.4 cm. There is a retrocrural lymph node on the left at the gastroesophageal junction level measuring 1.2 x 1.2 cm. No esophageal lesions are appreciable. Lungs/Pleura: There is widespread airspace opacity consistent with multifocal pneumonia. Most of this infiltrate has a ground-glass type appearance, although there are scattered areas of consolidation, particularly in the  superior, lateral, and posterior segments of the right lower lobe. There is no appreciable pleural effusion. Upper Abdomen: Spleen measures 16.1 x 14.8 x 9.5 cm with a measured splenic volume 1,132 mL. Gallbladder is absent. Visualized upper abdominal structures appear unremarkable. Musculoskeletal: There are foci of degenerative change in the thoracic spine. There are no blastic or lytic bone lesions. No chest wall lesions evident. Review of the MIP images confirms the above findings. IMPRESSION: 1. No demonstrable pulmonary embolus. No thoracic aortic aneurysm or dissection. 2. Widespread airspace opacity consistent with multifocal pneumonia bilaterally. Areas of consolidation noted in the right lower lobe. Other areas have a more ground-glass type appearance. Suspect much of this infiltrate is due to atypical organism etiology. 3. Areas of adenopathy, likely secondary to the extensive pneumonitis. 4. Prominence of the main pulmonary outflow tract, a finding indicative of pulmonary arterial hypertension. 5.  Splenomegaly. 6.  Gallbladder absent. Electronically Signed   By: Bretta BangWilliam  Woodruff III M.D.   On: 04/06/2019 13:05   DG Chest Port 1 View  Result Date: 04/08/2019 CLINICAL DATA:  Acute respiratory failure EXAM: PORTABLE CHEST 1 VIEW COMPARISON:  April 07, 2019 at 4:46 a.m. FINDINGS: Again noted are diffuse bilateral ground-glass airspace opacities, similar to prior study. There is no pneumothorax. There may be small bilateral pleural effusions. Heart size remains enlarged. There is no acute osseous abnormality. IMPRESSION: Persistent diffuse bilateral pulmonary opacities without significant interval change. Electronically Signed   By: Katherine Mantlehristopher  Green M.D.   On: 04/08/2019 02:54   DG Chest Port 1 View  Result Date: 04/07/2019 CLINICAL DATA:  Recently discharged with COVID-19 pneumonia. Shortness of breath. EXAM: PORTABLE CHEST 1 VIEW COMPARISON:  April 06, 2019 FINDINGS: Diffuse bilateral  pulmonary infiltrates are similar in the interval. Stable cardiomegaly. The hila and mediastinum are unchanged. No pneumothorax. IMPRESSION: No interval change in the bilateral pulmonary infiltrates. Recommend follow-up to resolution. Electronically Signed   By: Gerome Samavid  Williams III M.D   On: 04/07/2019 15:17   DG Chest Port 1 View  Result Date: 04/06/2019 CLINICAL DATA:  Pt to ED by EMS from home. Pt dx with COVID 3 weeks ago and [facility] and released last Saturday. Pt continues to be SOB. Pt place on non re-breather by EMS. Hx of HTN and diabetes. Former smoker. fever, recent covid, hypoxic EXAM: PORTABLE CHEST 1 VIEW COMPARISON:  Radiograph 03/26/2019 FINDINGS: Stable enlarged cardiac silhouette. There is increased patchy airspace disease in LEFT and RIGHT lung. Airspace disease is most dense in the lateral RIGHT lung. Bibasilar atelectasis. No pneumothorax IMPRESSION: Increasing bilateral patchy airspace disease consistent with viral pneumonia.  Electronically Signed   By: Suzy Bouchard M.D.   On: 04/06/2019 10:31   DG Chest Port 1 View  Result Date: 03/26/2019 CLINICAL DATA:  Nonproductive cough. Patient reports COVID-19 positive. EXAM: PORTABLE CHEST 1 VIEW COMPARISON:  Radiograph 05/27/2014, CT 07/07/2012 FINDINGS: Patient is rotated. Upper normal heart size with unchanged mediastinal contours allowing for rotation. Pericardial opacity corresponds to a prominent pericardial fat pad on CT. Minimal streaky bibasilar opacities. No pulmonary edema, pleural effusion or pneumothorax. No acute osseous abnormalities are seen. IMPRESSION: Minimal streaky bibasilar opacities. Findings favor atelectasis, however may represent atypical viral pneumonia in the setting of COVID-19. Electronically Signed   By: Keith Rake M.D.   On: 03/26/2019 23:39    Consults: Treatment Team:  Tyler Pita, MD    Objective:  Vital signs for last 24 hours: Temp:  [97.5 F (36.4 C)-98.9 F (37.2  C)] 97.5 F (36.4 C) (12/21 0400) Pulse Rate:  [56-73] 63 (12/21 0700) Resp:  [16-33] 20 (12/21 0700) BP: (79-138)/(62-78) 118/66 (12/21 0600) SpO2:  [83 %-100 %] 85 % (12/21 0700) FiO2 (%):  [100 %] 100 % (12/20 2024)  Hemodynamic parameters for last 24 hours:    Intake/Output from previous day: 12/20 0701 - 12/21 0700 In: 885.8 [P.O.:480; I.V.:364.8; IV Piggyback:41] Out: 1200 [Urine:1200]  Intake/Output this shift: No intake/output data recorded.  Vent settings for last 24 hours: FiO2 (%):  [100 %] 100 %    Assessment/Plan:   SEVERE HYPOXIC RESP FAILURE FROM COVID 19 INFECTION  HIGH RISK FOR INTUBATION HIGH RISK FOR CARDIAC ARREST   Severe ACUTE Hypoxic RESP FAILURE High risk for cardiac arrest High risk for intubation   LATE PHASE COVID 19 infection Progressive b/ infiltrates Permissive hypoxemia allowed o2 sats 80% Inhaled steroids and IV steroids pulmonary bronchial hygiene pronging and incentive spirometry, flutter valve Out of bed to chair as tolerated Continue Heparin   Morbid obesity BMI 52 This issue adds complexity to her management   Critical Care Time devoted to patient care services described in this note is 35 minutes.   Overall, patient is critically ill, prognosis is guarded.  Patient with Multiorgan failure and at high risk for cardiac arrest and death.    Corrin Parker, M.D.  [facility]  Medical Director [place] Director [facility] Cardio-Pulmonary Department

## 2019-04-09 NOTE — Consult Note (Signed)
ANTICOAGULATION CONSULT NOTE  Pharmacy Consult for Heparin Infusion Indication: Hypercoagable state 2/2 COVID disease  Allergies  Allergen Reactions  . Cephalexin Hives  . Codeine Palpitations, Nausea Only, Nausea And Vomiting, Rash and Shortness Of Breath    "makes heart fly, she gets flushed and passes out"  . Doxycycline Rash  . Propoxyphene Rash and Shortness Of Breath    Increase heart rate  . Sulfa Antibiotics Palpitations, Nausea Only, Shortness Of Breath and Hives    "makes heart fly, she gets flushed and passes out"  . Lovenox [Enoxaparin Sodium] Hives  . Hydrocodone Nausea And Vomiting    Hear racing & breaks out into a cold sweat.  . Meropenem Rash    Erythematous, hot, pruritic rash over arms, chest, back, abdomen, and face occurred at the end of meropenem infusion on 02/22/18    Patient Measurements: Height: 5\' 6"  (167.6 cm) Weight: (!) 320 lb (145.2 kg) IBW/kg (Calculated) : 59.3 Heparin Dosing Weight: 95.4  Vital Signs: Temp: 97.5 F (36.4 C) (12/21 0400) Temp Source: Axillary (12/21 0400) BP: 118/66 (12/21 0600) Pulse Rate: 63 (12/21 0700)  Labs: Recent Labs    04/07/19 0243 04/07/19 0422 04/07/19 0422 04/07/19 4401 04/07/19 0837 04/08/19 0523 04/08/19 2211 04/09/19 0629 04/09/19 1210  HGB   < > 10.0*  --   --   --  10.5*  --  9.7*  --   HCT  --  29.7*  --   --   --  29.3*  --  27.0*  --   PLT  --  175  --   --   --  197  --  231  --   HEPARINUNFRC  --   --    < >  --  0.50  --  0.76* 0.35 0.76*  CREATININE  --  1.09*  --  1.05*  --  1.06*  --  1.17*  --   TROPONINIHS  --   --   --  3 3  --   --   --   --    < > = values in this interval not displayed.    Estimated Creatinine Clearance: 78.5 mL/min (A) (by C-G formula based on SCr of 1.17 mg/dL (H)).   Medical History: Past Medical History:  Diagnosis Date  . Abdominal wall hernia 01/29/2013  . Anxiety   . Arthritis    Rheumatoid  . C. difficile colitis   . Chronic diastolic heart  failure ([place])   . Depression   . Diabetes mellitus    states no meds or diet restrictions  at present  . Diastolic CHF ([place])   . Esophagitis   . Fluid retention   . GERD (gastroesophageal reflux disease)   . Hiatal hernia   . Hypertension   . Hypokalemia due to loss of potassium 10/21/2015   Overview:  Associated with 3 weeks of diarrhea  And QT prolongation.  . Hypothyroidism   . IBS (irritable bowel syndrome)   . Moderate episode of recurrent major depressive disorder ([place]) 06/03/2004  . Morbid obesity ([place])   . MRSA (methicillin resistant Staphylococcus aureus) infection 11/2017   left inner thigh abcess  . Neurogenic bladder    has pacemaker  . Neuropathy   . Obesity   . Panic attacks   . Rheumatoid arthritis ([place])   . Sleep apnea    STATES SEVERE, CANT TOLERATE MASK- LAST STUDY YEARS AGO    Medications:  No anticoagulation prior to admission per medication reconcilation  Assessment: 58 y/o F with history of anxiety, diabetes, gastric reflux, hypertension, CHF, obesity who presented to [facility] ED 12/18 with shortness of breath. Previous COVID diagnosis for which she was admitted to [facility] 12/8-12/12. D dimer elevated to 1405. CXR with bilateral patchy infiltration. CTA negative for PE. Pharmacy has been consulted to initiate heparin drip for hypercoagulable state secondary to COVID disease.  Per nurse, no interruptions with infusion.  Goal of Therapy:  Heparin level 0.3-0.7 units/ml Monitor platelets by anticoagulation protocol: Yes   Plan:  12/21 @ 1210 HL 0.76 supratherapeutic.  Will decrease current rate to 1500 units/hour Recheck HL at 1900 CBC with AM labs.  Hgb trending down will continue to monitor.   Cherly Hensen, PharmD Pharmacy Resident  04/09/2019 1:01 PM

## 2019-04-09 NOTE — Consult Note (Signed)
ANTICOAGULATION CONSULT NOTE  Pharmacy Consult for Heparin Infusion Indication: Hypercoagable state 2/2 COVID disease  Allergies  Allergen Reactions  . Cephalexin Hives  . Codeine Palpitations, Nausea Only, Nausea And Vomiting, Rash and Shortness Of Breath    "makes heart fly, she gets flushed and passes out"  . Doxycycline Rash  . Propoxyphene Rash and Shortness Of Breath    Increase heart rate  . Sulfa Antibiotics Palpitations, Nausea Only, Shortness Of Breath and Hives    "makes heart fly, she gets flushed and passes out"  . Lovenox [Enoxaparin Sodium] Hives  . Hydrocodone Nausea And Vomiting    Hear racing & breaks out into a cold sweat.  . Meropenem Rash    Erythematous, hot, pruritic rash over arms, chest, back, abdomen, and face occurred at the end of meropenem infusion on 02/22/18    Patient Measurements: Height: 5\' 6"  (167.6 cm) Weight: (!) 320 lb (145.2 kg) IBW/kg (Calculated) : 59.3 Heparin Dosing Weight: 95.4  Vital Signs: Temp: 97.6 F (36.4 C) (12/21 2000) Temp Source: Oral (12/21 2000) BP: 115/65 (12/21 2000) Pulse Rate: 73 (12/21 2000)  Labs: Recent Labs    04/07/19 0243 04/07/19 0422 04/07/19 0422 04/07/19 04/09/19 04/07/19 0837 04/08/19 0523 04/09/19 0629 04/09/19 1210 04/09/19 2105  HGB   < > 10.0*  --   --   --  10.5* 9.7*  --   --   HCT  --  29.7*  --   --   --  29.3* 27.0*  --   --   PLT  --  175  --   --   --  197 231  --   --   HEPARINUNFRC  --   --    < >  --  0.50  --  0.35 0.76* 0.47  CREATININE  --  1.09*  --  1.05*  --  1.06* 1.17*  --   --   TROPONINIHS  --   --   --  3 3  --   --   --   --    < > = values in this interval not displayed.    Estimated Creatinine Clearance: 78.5 mL/min (A) (by C-G formula based on SCr of 1.17 mg/dL (H)).   Medical History: Past Medical History:  Diagnosis Date  . Abdominal wall hernia 01/29/2013  . Anxiety   . Arthritis    Rheumatoid  . C. difficile colitis   . Chronic diastolic heart failure  (H[facility])   . Depression   . Diabetes mellitus    states no meds or diet restrictions  at present  . Diastolic CHF ([facility])   . Esophagitis   . Fluid retention   . GERD (gastroesophageal reflux disease)   . Hiatal hernia   . Hypertension   . Hypokalemia due to loss of potassium 10/21/2015   Overview:  Associated with 3 weeks of diarrhea  And QT prolongation.  . Hypothyroidism   . IBS (irritable bowel syndrome)   . Moderate episode of recurrent major depressive disorder (H[facility]) 06/03/2004  . Morbid obesity (H[facility])   . MRSA (methicillin resistant Staphylococcus aureus) infection 11/2017   left inner thigh abcess  . Neurogenic bladder    has pacemaker  . Neuropathy   . Obesity   . Panic attacks   . Rheumatoid arthritis (H[facility])   . Sleep apnea    STATES SEVERE, CANT TOLERATE MASK- LAST STUDY YEARS AGO    Medications:  No anticoagulation prior to admission per medication reconcilation  Assessment: 58 y/o F with history of anxiety, diabetes, gastric reflux, hypertension, CHF, obesity who presented to [facility] ED 12/18 with shortness of breath. Previous COVID diagnosis for which she was admitted to [facility] 12/8-12/12. D dimer elevated to 1405. CXR with bilateral patchy infiltration. CTA negative for PE. Pharmacy has been consulted to initiate heparin drip for hypercoagulable state secondary to COVID disease.  Per nurse, no interruptions with infusion.  Goal of Therapy:  Heparin level 0.3-0.7 units/ml Monitor platelets by anticoagulation protocol: Yes   Plan:  12/21 @ 1210 HL 0.76 supratherapeutic.  Will decrease current rate to 1500 units/hour Recheck HL at 1900 CBC with AM labs.  Hgb trending down will continue to monitor.  12/21: HL @ 2105 = 0.47 Will continue pt on current rate and recheck HL in 6 hrs on 12/22 @ 0300.    Ziaire Bieser D, PharmD 04/09/2019 9:33 PM

## 2019-04-09 NOTE — Progress Notes (Signed)
  PROGRESS NOTE    [name] [name]  XHB:[id number] DOB: July 11, 1960 DOA: 04/06/2019 PCP: [facility]      Brief Narrative:  [name] [name] is a 58 y.o. F with obesity, DM, RA on Actemra, dCHF, hx Cdiff, HTN, hypothyroidism, depression, neurogenic bladder with bladder pacer, and recent COVID-19 who presented with dyspnea found to have late pulmonary phase COVID pulmonary disease.            Assessment & Plan:  Acute hypoxic respiratory failure due to post-COVID inflammation  Chronic diastolic CHF  AKI  Diabetes  Hypertension  Hypothyroidism  Neurogenic bladder  Depression  Rheumatoid arthritis  BMI 52  Chronic pain syndrome         MDM and disposition: Patient has deteriorated to the point that she requires ICU level respiratory support.  She has advanced pulmonary disease and our Pulmonology-Critical Care colleagues have graciously assumed care.  She will remain in ICU and the hospitalist service will sign off.  Please reconsult when patient is near transfer to stepdown.  This is a no charge note.       DVT prophylaxis: Not applicable, on heparin infusion Code Status: Full code Family Communication:      Procedures:   12/18 CT angiogram chest-no PE, multifocal pneumonia  Antimicrobials:   Levaquin 12/18 x 1  Vancomycin 12/18 x 1  Ivermectin 12/18 x 1  Culture data:   12/18 culture blood x2 -- CoNS in 1/2, likely contaminant  12/18 urine culture -- yeast       Subjective: Very dyspneic.  Coughing.    Objective: Vitals:   04/09/19 0400 04/09/19 0500 04/09/19 0600 04/09/19 0700  BP: 104/73 123/77 118/66   Pulse: (!) 56 65 61 63  Resp: (!) 25 (!) 25 (!) 27 20  Temp: (!) 97.5 F (36.4 C)     TempSrc: Axillary     SpO2: 90% 90% 93% (!) 85%  Weight:      Height:        Intake/Output Summary (Last 24 hours) at 04/09/2019 8101 Last data filed at 04/09/2019 0700 Gross per 24 hour  Intake 926.31 ml  Output 1200 ml   Net -273.69 ml   Filed Weights   04/06/19 1000  Weight: (!) 145.2 kg         Edwin Dada, MD [facility] 04/09/2019, 9:27 AM     Please page though Shea Evans or Epic secure chat:  For [name] [name], [name] [name]

## 2019-04-09 NOTE — Consult Note (Signed)
ANTICOAGULATION CONSULT NOTE  Pharmacy Consult for Heparin Infusion Indication: Hypercoagable state 2/2 COVID disease  Allergies  Allergen Reactions  . Cephalexin Hives  . Codeine Palpitations, Nausea Only, Nausea And Vomiting, Rash and Shortness Of Breath    "makes heart fly, she gets flushed and passes out"  . Doxycycline Rash  . Propoxyphene Rash and Shortness Of Breath    Increase heart rate  . Sulfa Antibiotics Palpitations, Nausea Only, Shortness Of Breath and Hives    "makes heart fly, she gets flushed and passes out"  . Lovenox [Enoxaparin Sodium] Hives  . Hydrocodone Nausea And Vomiting    Hear racing & breaks out into a cold sweat.  . Meropenem Rash    Erythematous, hot, pruritic rash over arms, chest, back, abdomen, and face occurred at the end of meropenem infusion on 02/22/18    Patient Measurements: Height: 5\' 6"  (167.6 cm) Weight: (!) 320 lb (145.2 kg) IBW/kg (Calculated) : 59.3 Heparin Dosing Weight: 95.4  Vital Signs: Temp: 97.5 F (36.4 C) (12/21 0400) Temp Source: Axillary (12/21 0400) BP: 118/66 (12/21 0600) Pulse Rate: 63 (12/21 0700)  Labs: Recent Labs    04/06/19 1010 04/07/19 0243 04/07/19 0422 04/07/19 0422 04/07/19 4196 04/07/19 0837 04/08/19 0523 04/08/19 1609 04/08/19 2211 04/09/19 0629  HGB 10.1*   < > 10.0*  --   --   --  10.5*  --   --  9.7*  HCT 31.1*  --  29.7*  --   --   --  29.3*  --   --  27.0*  PLT 152  --  175  --   --   --  197  --   --  231  APTT 38*  --   --   --   --   --   --   --   --   --   LABPROT 15.8*  --   --   --   --   --   --   --   --   --   INR 1.3*  --   --   --   --   --   --   --   --   --   HEPARINUNFRC  --   --   --    < >  --  0.50  --  0.67 0.76* 0.35  CREATININE 1.32*  --  1.09*  --  1.05*  --  1.06*  --   --   --   TROPONINIHS  --   --   --   --  3 3  --   --   --   --    < > = values in this interval not displayed.    Estimated Creatinine Clearance: 86.6 mL/min (A) (by C-G formula based on SCr  of 1.06 mg/dL (H)).   Medical History: Past Medical History:  Diagnosis Date  . Abdominal wall hernia 01/29/2013  . Anxiety   . Arthritis    Rheumatoid  . C. difficile colitis   . Chronic diastolic heart failure ([place])   . Depression   . Diabetes mellitus    states no meds or diet restrictions  at present  . Diastolic CHF ([place])   . Esophagitis   . Fluid retention   . GERD (gastroesophageal reflux disease)   . Hiatal hernia   . Hypertension   . Hypokalemia due to loss of potassium 10/21/2015   Overview:  Associated with 3 weeks of diarrhea  And QT prolongation.  . Hypothyroidism   . IBS (irritable bowel syndrome)   . Moderate episode of recurrent major depressive disorder (HCC) 06/03/2004  . Morbid obesity (HCC)   . MRSA (methicillin resistant Staphylococcus aureus) infection 11/2017   left inner thigh abcess  . Neurogenic bladder    has pacemaker  . Neuropathy   . Obesity   . Panic attacks   . Rheumatoid arthritis (HCC)   . Sleep apnea    STATES SEVERE, CANT TOLERATE MASK- LAST STUDY YEARS AGO    Medications:  No anticoagulation prior to admission per medication reconcilation  Assessment: 58 y/o F with history of anxiety, diabetes, gastric reflux, hypertension, CHF, obesity who presented to [facility] ED 12/18 with shortness of breath. Previous COVID diagnosis for which she was admitted to [facility] 12/8-12/12. D dimer elevated to 1405. CXR with bilateral patchy infiltration. CTA negative for PE. Pharmacy has been consulted to initiate heparin drip for hypercoagulable state secondary to COVID disease.  Heparin infusing at 1700 units/hr.   Goal of Therapy:  Heparin level 0.3-0.7 units/ml Monitor platelets by anticoagulation protocol: Yes   Plan:  12/21 @ 0600 HL 0.35 therapeutic. Will continue current rate and will recheck HL at 1200, Hgb trending down will continue to monitor.  Thomasene Ripple, PharmD, BCPS Clinical Pharmacist 04/09/2019,7:25 AM

## 2019-04-09 NOTE — Consult Note (Signed)
ANTICOAGULATION CONSULT NOTE  Pharmacy Consult for Heparin Infusion Indication: Hypercoagable state 2/2 COVID disease  Allergies  Allergen Reactions  . Cephalexin Hives  . Codeine Palpitations, Nausea Only, Nausea And Vomiting, Rash and Shortness Of Breath    "makes heart fly, she gets flushed and passes out"  . Doxycycline Rash  . Propoxyphene Rash and Shortness Of Breath    Increase heart rate  . Sulfa Antibiotics Palpitations, Nausea Only, Shortness Of Breath and Hives    "makes heart fly, she gets flushed and passes out"  . Lovenox [Enoxaparin Sodium] Hives  . Hydrocodone Nausea And Vomiting    Hear racing & breaks out into a cold sweat.  . Meropenem Rash    Erythematous, hot, pruritic rash over arms, chest, back, abdomen, and face occurred at the end of meropenem infusion on 02/22/18    Patient Measurements: Height: 5\' 6"  (167.6 cm) Weight: (!) 320 lb (145.2 kg) IBW/kg (Calculated) : 59.3 Heparin Dosing Weight: 95.4  Vital Signs: Temp: 97.6 F (36.4 C) (12/20 2100) Temp Source: Oral (12/20 2100) BP: 100/67 (12/20 2200) Pulse Rate: 66 (12/20 2200)  Labs: Recent Labs    04/06/19 1010 04/07/19 0422 04/07/19 2585 04/07/19 0837 04/08/19 0523 04/08/19 0745 04/08/19 1609 04/08/19 2211  HGB 10.1* 10.0*  --   --  10.5*  --   --   --   HCT 31.1* 29.7*  --   --  29.3*  --   --   --   PLT 152 175  --   --  197  --   --   --   APTT 38*  --   --   --   --   --   --   --   LABPROT 15.8*  --   --   --   --   --   --   --   INR 1.3*  --   --   --   --   --   --   --   HEPARINUNFRC  --   --   --  0.50  --  0.24* 0.67 0.76*  CREATININE 1.32* 1.09* 1.05*  --  1.06*  --   --   --   TROPONINIHS  --   --  3 3  --   --   --   --     Estimated Creatinine Clearance: 86.6 mL/min (A) (by C-G formula based on SCr of 1.06 mg/dL (H)).   Medical History: Past Medical History:  Diagnosis Date  . Abdominal wall hernia 01/29/2013  . Anxiety   . Arthritis    Rheumatoid  . C.  difficile colitis   . Chronic diastolic heart failure ([place])   . Depression   . Diabetes mellitus    states no meds or diet restrictions  at present  . Diastolic CHF ([place])   . Esophagitis   . Fluid retention   . GERD (gastroesophageal reflux disease)   . Hiatal hernia   . Hypertension   . Hypokalemia due to loss of potassium 10/21/2015   Overview:  Associated with 3 weeks of diarrhea  And QT prolongation.  . Hypothyroidism   . IBS (irritable bowel syndrome)   . Moderate episode of recurrent major depressive disorder ([place]) 06/03/2004  . Morbid obesity ([place])   . MRSA (methicillin resistant Staphylococcus aureus) infection 11/2017   left inner thigh abcess  . Neurogenic bladder    has pacemaker  . Neuropathy   . Obesity   .  Panic attacks   . Rheumatoid arthritis (HCC)   . Sleep apnea    STATES SEVERE, CANT TOLERATE MASK- LAST STUDY YEARS AGO    Medications:  No anticoagulation prior to admission per medication reconcilation  Assessment: 58 y/o F with history of anxiety, diabetes, gastric reflux, hypertension, CHF, obesity who presented to [facility] ED 12/18 with shortness of breath. Previous COVID diagnosis for which she was admitted to [facility] 12/8-12/12. D dimer elevated to 1405. CXR with bilateral patchy infiltration. CTA negative for PE. Pharmacy has been consulted to initiate heparin drip for hypercoagulable state secondary to COVID disease.  Heparin infusing at 1700 units/hr.   Goal of Therapy:  Heparin level 0.3-0.7 units/ml Monitor platelets by anticoagulation protocol: Yes   Plan:  12/20 @ 2200 HL 0.76 supratherapeutic. Will reduce rate to 1600 units/hr and will recheck HL @ 0600, CBC low stable will continue to monitor.  Thomasene Ripple, PharmD, BCPS Clinical Pharmacist 04/09/2019,12:25 AM

## 2019-04-10 ENCOUNTER — Inpatient Hospital Stay: Payer: Medicaid Other

## 2019-04-10 ENCOUNTER — Inpatient Hospital Stay
Admit: 2019-04-10 | Discharge: 2019-04-10 | Disposition: A | Discharge status: Home / Self Care | Payer: Medicaid Other | Attending: Critical Care Medicine | Admitting: Critical Care Medicine

## 2019-04-10 LAB — BLOOD GAS, ARTERIAL
Acid-Base Excess: 1.6 mmol/L (ref 0.0–2.0)
Bicarbonate: 24.6 mmol/L (ref 20.0–28.0)
FIO2: 100
O2 Saturation: 85.9 %
Patient temperature: 37
pCO2 arterial: 33 mmHg (ref 32.0–48.0)
pH, Arterial: 7.48 — ABNORMAL HIGH (ref 7.350–7.450)
pO2, Arterial: 47 mmHg — ABNORMAL LOW (ref 83.0–108.0)

## 2019-04-10 LAB — GLUCOSE, CAPILLARY
Glucose-Capillary: 246 mg/dL — ABNORMAL HIGH (ref 70–99)
Glucose-Capillary: 136 mg/dL — ABNORMAL HIGH (ref 70–99)
Glucose-Capillary: 222 mg/dL — ABNORMAL HIGH (ref 70–99)
Glucose-Capillary: 238 mg/dL — ABNORMAL HIGH (ref 70–99)
Glucose-Capillary: 251 mg/dL — ABNORMAL HIGH (ref 70–99)

## 2019-04-10 LAB — CBC WITH DIFFERENTIAL/PLATELET
Abs Immature Granulocytes: 0.14 10*3/uL — ABNORMAL HIGH (ref 0.00–0.07)
Basophils Absolute: 0 10*3/uL (ref 0.0–0.1)
Basophils Relative: 0 %
Eosinophils Absolute: 0 10*3/uL (ref 0.0–0.5)
Eosinophils Relative: 0 %
HCT: 32.3 % — ABNORMAL LOW (ref 36.0–46.0)
Hemoglobin: 10.7 g/dL — ABNORMAL LOW (ref 12.0–15.0)
Immature Granulocytes: 2 %
Lymphocytes Relative: 13 %
Lymphs Abs: 1.1 10*3/uL (ref 0.7–4.0)
MCH: 25.7 pg — ABNORMAL LOW (ref 26.0–34.0)
MCHC: 33.1 g/dL (ref 30.0–36.0)
MCV: 77.6 fL — ABNORMAL LOW (ref 80.0–100.0)
Monocytes Absolute: 0.4 10*3/uL (ref 0.1–1.0)
Monocytes Relative: 5 %
Neutro Abs: 6.8 10*3/uL (ref 1.7–7.7)
Neutrophils Relative %: 80 %
Platelets: 313 10*3/uL (ref 150–400)
RBC: 4.16 MIL/uL (ref 3.87–5.11)
RDW: 15.6 % — ABNORMAL HIGH (ref 11.5–15.5)
WBC: 8.5 10*3/uL (ref 4.0–10.5)
nRBC: 0 % (ref 0.0–0.2)

## 2019-04-10 LAB — ECHOCARDIOGRAM COMPLETE
Height: 66 in
Weight: 5120 oz

## 2019-04-10 LAB — COMPREHENSIVE METABOLIC PANEL
ALT: 20 U/L (ref 0–44)
AST: 32 U/L (ref 15–41)
Albumin: 2.6 g/dL — ABNORMAL LOW (ref 3.5–5.0)
Alkaline Phosphatase: 58 U/L (ref 38–126)
Anion gap: 9 (ref 5–15)
BUN: 36 mg/dL — ABNORMAL HIGH (ref 6–20)
CO2: 24 mmol/L (ref 22–32)
Calcium: 8.8 mg/dL — ABNORMAL LOW (ref 8.9–10.3)
Chloride: 105 mmol/L (ref 98–111)
Creatinine, Ser: 1.33 mg/dL — ABNORMAL HIGH (ref 0.44–1.00)
GFR calc Af Amer: 51 mL/min — ABNORMAL LOW (ref >60)
GFR calc non Af Amer: 44 mL/min — ABNORMAL LOW (ref >60)
Glucose, Bld: 84 mg/dL (ref 70–99)
Potassium: 3.1 mmol/L — ABNORMAL LOW (ref 3.5–5.1)
Sodium: 138 mmol/L (ref 135–145)
Total Bilirubin: 0.9 mg/dL (ref 0.3–1.2)
Total Protein: 7.4 g/dL (ref 6.5–8.1)

## 2019-04-10 LAB — FERRITIN: Ferritin: 592 ng/mL — ABNORMAL HIGH (ref 11–307)

## 2019-04-10 LAB — C-REACTIVE PROTEIN: CRP: 9.3 mg/dL — ABNORMAL HIGH (ref <1.0)

## 2019-04-10 LAB — MAGNESIUM: Magnesium: 1.7 mg/dL (ref 1.7–2.4)

## 2019-04-10 LAB — HEPARIN LEVEL (UNFRACTIONATED): Heparin Unfractionated: 0.34 IU/mL (ref 0.30–0.70)

## 2019-04-10 LAB — FIBRIN DERIVATIVES D-DIMER (ARMC ONLY): Fibrin derivatives D-dimer (ARMC): 1883.93 ng/mL (FEU) — ABNORMAL HIGH (ref 0.00–499.00)

## 2019-04-10 MED ORDER — MAGNESIUM SULFATE 2 GM/50ML IV SOLN
2.0000 g | Freq: Once | INTRAVENOUS | Status: AC
  Administered 2019-04-10: 2 g via INTRAVENOUS
  Filled 2019-04-10: qty 50

## 2019-04-10 MED ORDER — METHYLPREDNISOLONE SODIUM SUCC 125 MG IJ SOLR: 60.0000 mg | INTRAMUSCULAR | Status: AC

## 2019-04-10 MED ORDER — SODIUM CHLORIDE 0.9% FLUSH
10.0000 mL | Freq: Two times a day (BID) | INTRAVENOUS | Status: DC
  Administered 2019-04-10 – 2019-04-17 (×13): 10 mL
  Administered 2019-04-17: 20 mL
  Administered 2019-04-18 – 2019-04-21 (×7): 10 mL
  Administered 2019-04-21: 20 mL
  Administered 2019-04-22 – 2019-05-04 (×24): 10 mL

## 2019-04-10 MED ORDER — ALPRAZOLAM 0.5 MG PO TABS
0.5000 mg | ORAL_TABLET | ORAL | Status: AC
  Administered 2019-04-10: 0.5 mg via ORAL
  Filled 2019-04-10: qty 1

## 2019-04-10 MED ORDER — POTASSIUM CHLORIDE CRYS ER 20 MEQ PO TBCR
30.0000 meq | EXTENDED_RELEASE_TABLET | ORAL | Status: AC
  Administered 2019-04-10 (×2): 30 meq via ORAL
  Filled 2019-04-10 (×2): qty 2

## 2019-04-10 MED ORDER — SODIUM CHLORIDE 0.9% FLUSH: 10.0000 mL | INTRAVENOUS | Status: DC | PRN

## 2019-04-10 MED ORDER — FUROSEMIDE 10 MG/ML IJ SOLN
40.0000 mg | Freq: Once | INTRAMUSCULAR | Status: AC
  Administered 2019-04-10: 40 mg via INTRAVENOUS
  Filled 2019-04-10: qty 4

## 2019-04-10 MED ORDER — METHYLPREDNISOLONE SODIUM SUCC 125 MG IJ SOLR
INTRAMUSCULAR | Status: AC
  Administered 2019-04-10: 60 mg via INTRAVENOUS
  Filled 2019-04-10: qty 2

## 2019-04-10 MED ORDER — METHYLPREDNISOLONE SODIUM SUCC 40 MG IJ SOLR
40.0000 mg | Freq: Two times a day (BID) | INTRAMUSCULAR | Status: DC
  Administered 2019-04-10 – 2019-04-21 (×22): 40 mg via INTRAVENOUS
  Filled 2019-04-10 (×23): qty 1

## 2019-04-10 MED ORDER — ORAL CARE MOUTH RINSE
15.0000 mL | Freq: Two times a day (BID) | OROMUCOSAL | Status: DC
  Administered 2019-04-10 – 2019-05-04 (×41): 15 mL via OROMUCOSAL

## 2019-04-10 MED ORDER — SODIUM CHLORIDE 0.9 % IV SOLN
2.0000 g | Freq: Three times a day (TID) | INTRAVENOUS | Status: DC
  Administered 2019-04-10 – 2019-04-12 (×7): 2 g via INTRAVENOUS
  Filled 2019-04-10 (×9): qty 2

## 2019-04-10 NOTE — Consult Note (Signed)
ANTICOAGULATION CONSULT NOTE  Pharmacy Consult for Heparin Infusion Indication: Hypercoagable state 2/2 COVID disease  Allergies  Allergen Reactions  . Cephalexin Hives  . Codeine Palpitations, Nausea Only, Nausea And Vomiting, Rash and Shortness Of Breath    "makes heart fly, she gets flushed and passes out"  . Doxycycline Rash  . Propoxyphene Rash and Shortness Of Breath    Increase heart rate  . Sulfa Antibiotics Palpitations, Nausea Only, Shortness Of Breath and Hives    "makes heart fly, she gets flushed and passes out"  . Lovenox [Enoxaparin Sodium] Hives  . Hydrocodone Nausea And Vomiting    Hear racing & breaks out into a cold sweat.  . Meropenem Rash    Erythematous, hot, pruritic rash over arms, chest, back, abdomen, and face occurred at the end of meropenem infusion on 02/22/18    Patient Measurements: Height: 5\' 6"  (167.6 cm) Weight: (!) 320 lb (145.2 kg) IBW/kg (Calculated) : 59.3 Heparin Dosing Weight: 95.4  Vital Signs: Temp: 102.5 F (39.2 C) (12/22 0208) Temp Source: Axillary (12/22 0208) BP: 94/60 (12/22 0208) Pulse Rate: 96 (12/22 0208)  Labs: Recent Labs    04/07/19 0422 04/07/19 0422 04/07/19 9629 04/07/19 0837 04/08/19 0523 04/09/19 0629 04/09/19 1210 04/09/19 2105 04/10/19 0251  HGB 10.0*  --   --   --  10.5* 9.7*  --   --   --   HCT 29.7*  --   --   --  29.3* 27.0*  --   --   --   PLT 175  --   --   --  197 231  --   --   --   HEPARINUNFRC  --    < >  --  0.50  --  0.35 0.76* 0.47 0.34  CREATININE 1.09*  --  1.05*  --  1.06* 1.17*  --   --   --   TROPONINIHS  --   --  3 3  --   --   --   --   --    < > = values in this interval not displayed.    Estimated Creatinine Clearance: 78.5 mL/min (A) (by C-G formula based on SCr of 1.17 mg/dL (H)).   Medical History: Past Medical History:  Diagnosis Date  . Abdominal wall hernia 01/29/2013  . Anxiety   . Arthritis    Rheumatoid  . C. difficile colitis   . Chronic diastolic heart  failure ([place])   . Depression   . Diabetes mellitus    states no meds or diet restrictions  at present  . Diastolic CHF ([place])   . Esophagitis   . Fluid retention   . GERD (gastroesophageal reflux disease)   . Hiatal hernia   . Hypertension   . Hypokalemia due to loss of potassium 10/21/2015   Overview:  Associated with 3 weeks of diarrhea  And QT prolongation.  . Hypothyroidism   . IBS (irritable bowel syndrome)   . Moderate episode of recurrent major depressive disorder ([place]) 06/03/2004  . Morbid obesity ([place])   . MRSA (methicillin resistant Staphylococcus aureus) infection 11/2017   left inner thigh abcess  . Neurogenic bladder    has pacemaker  . Neuropathy   . Obesity   . Panic attacks   . Rheumatoid arthritis ([place])   . Sleep apnea    STATES SEVERE, CANT TOLERATE MASK- LAST STUDY YEARS AGO    Medications:  No anticoagulation prior to admission per medication reconcilation  Assessment: 58  y/o F with history of anxiety, diabetes, gastric reflux, hypertension, CHF, obesity who presented to [facility] ED 12/18 with shortness of breath. Previous COVID diagnosis for which she was admitted to [facility] 12/8-12/12. D dimer elevated to 1405. CXR with bilateral patchy infiltration. CTA negative for PE. Pharmacy has been consulted to initiate heparin drip for hypercoagulable state secondary to COVID disease.  Per nurse, no interruptions with infusion.  Goal of Therapy:  Heparin level 0.3-0.7 units/ml Monitor platelets by anticoagulation protocol: Yes   Plan:  12/21 @ 1210 HL 0.76 supratherapeutic.  Will decrease current rate to 1500 units/hour Recheck HL at 1900 CBC with AM labs.  Hgb trending down will continue to monitor.  12/21: HL @ 2105 = 0.47 12/22: HL @ 0251 = 0.34, therapeutic x 2 Will continue pt on current rate and recheck HL daily.    Wayland Denis, PharmD 04/10/2019 3:34 AM

## 2019-04-10 NOTE — Progress Notes (Signed)
Pt hypoxic with O2 sats 78-79% while laying on her back with NRB and 100% HFNC in place.  CXR concerning for pulmonary edema and bilateral opacities.  Administered 40 mg iv lasix and 60 mg iv solumedrol.  Instructed pt to prone O2 sats with interventions 84-87% on NRB and 100% HFNC.  Will continue to monitor and assess pt.  Marda Stalker, [place] Pager [phone] (please enter 7 digits) PCCM Consult Pager [phone] (please enter 7 digits)

## 2019-04-10 NOTE — Progress Notes (Signed)
CRITICAL CARE NOTE [name] [name] is an 58 y.o. female former smoker with a history of diabetes mellitus, morbid obesity and rheumatoid arthritis diagnosed with COVID-19 on December 4.  Admitted to [facility] from 8 December through 12 December received 5 days of remdesivir.  Also received dexamethasone.  She has been found to be on the late pulmonary phase of COVID-19 infection marked by intense inflammatory response. CC  follow up severe hypoxic respiratory failure  SUBJECTIVE Patient remains critically ill Prognosis is guarded High risk for intubation   BP 104/75   Pulse 82   Temp 99 F (37.2 C) (Oral)   Resp (!) 23   Ht 5\' 6"  (1.676 m)   Wt (!) 145.2 kg   LMP 04/20/2001   SpO2 91%   BMI 51.65 kg/m    I/O last 3 completed shifts: In: 1113.1 [P.O.:600; I.V.:513.1] Out: 3150 [Urine:3150] No intake/output data recorded.  SpO2: 91 % O2 Flow Rate (L/min): 40 L/min FiO2 (%): 98 %   SIGNIFICANT EVENTS 12/4: Diagnosed with COVID-19 12/8-12: Admitted to [facility] for management of COVID-19, received remdesivir 12/18: Presented with increasing shortness of breath and hypoxemia quiring high flow O2, bilateral pulmonary infiltrates consistent with late, pulmonary phase of COVID-19 disease.  Initiated COVID-19 management protocol for late pulmonary phase. 12/20: Was able to tolerate some proning last night.  Oxygenation remains marginal, requiring high flow 12/22 severe hypoxia resp failure on high flow [place]   COVID-19 DISASTER DECLARATION:   FULL CONTACT PHYSICAL EXAMINATION WAS NOT POSSIBLE DUE TO TREATMENT OF COVID-19 AND   CONSERVATION OF PERSONAL PROTECTIVE EQUIPMENT, LIMITED EXAM FINDINGS INCLUDE-   Patient assessed or the symptoms described in the history of present illness.   In the context of the Global COVID-19 pandemic, which necessitated consideration that the patient might be at risk for infection with the SARS-CoV-2 virus that causes COVID-19,  Institutional protocols and algorithms that pertain to the evaluation of patients at risk for COVID-19 are in a state of rapid change based on information released by regulatory bodies including the CDC and federal and state organizations. These policies and algorithms were followed during the patient's care while in hospital.    PHYSICAL EXAMINATION:  GENERAL:critically ill appearing, +resp distress MEDICATIONS: I have reviewed all medications and confirmed regimen as documented   CULTURE RESULTS   Recent Results (from the past 240 hour(s))  Blood culture (routine x 2)     Status: None   Collection Time: 04/01/19  9:52 PM   Specimen: BLOOD  Result Value Ref Range Status   Specimen Description BLOOD RIGHT ANTECUBITAL  Final   Special Requests   Final    BOTTLES DRAWN AEROBIC AND ANAEROBIC Blood Culture results may not be optimal due to an inadequate volume of blood received in culture bottles   Culture   Final    NO GROWTH 5 DAYS Performed at [facility], [address], [place], [place] [address]    Report Status 04/06/2019 FINAL  Final  Blood culture (routine x 2)     Status: None   Collection Time: 04/01/19  9:52 PM   Specimen: BLOOD  Result Value Ref Range Status   Specimen Description BLOOD LEFT ANTECUBITAL  Final   Special Requests   Final    BOTTLES DRAWN AEROBIC AND ANAEROBIC Blood Culture adequate volume   Culture   Final    NO GROWTH 5 DAYS Performed at [facility], [address] [place], [place] [address]  Report Status 04/06/2019 FINAL  Final  MRSA PCR Screening     Status: Abnormal   Collection Time: 04/06/19  9:48 AM   Specimen: Nasopharyngeal  Result Value Ref Range Status   MRSA by PCR POSITIVE (A) NEGATIVE Final    Comment:        The GeneXpert MRSA Assay (FDA approved for NASAL specimens only), is one component of a comprehensive MRSA colonization surveillance program. It is not intended to diagnose MRSA infection nor to  guide or monitor treatment for MRSA infections. RESULT CALLED TO, READ BACK BY AND VERIFIED WITH: JAMIE DEEM @ 1645 ON 04/07/2019 BY CAF Performed at [facility], [address] [place], [place] [address]   Blood Culture (routine x 2)     Status: Abnormal   Collection Time: 04/06/19 10:10 AM   Specimen: BLOOD  Result Value Ref Range Status   Specimen Description   Final    BLOOD RAC Performed at [facility], [address], [place], [place] [address]    Special Requests   Final    BOTTLES DRAWN AEROBIC AND ANAEROBIC Blood Culture adequate volume Performed at [facility], [address] [place], [place] [address]    Culture  Setup Time   Final    GRAM POSITIVE COCCI IN CLUSTERS AEROBIC BOTTLE ONLY CRITICAL RESULT CALLED TO, READ BACK BY AND VERIFIED WITH: ALEX CHAPPELL @0950  ON 04/07/2019 BY FMW Performed at [facility], [address] [place], [place] [address]    Culture (A)  Final    STAPHYLOCOCCUS SPECIES (COAGULASE NEGATIVE) THE SIGNIFICANCE OF ISOLATING THIS ORGANISM FROM A SINGLE SET OF BLOOD CULTURES WHEN MULTIPLE SETS ARE DRAWN IS UNCERTAIN. PLEASE NOTIFY THE MICROBIOLOGY DEPARTMENT WITHIN ONE WEEK IF SPECIATION AND SENSITIVITIES ARE REQUIRED. Performed at [facility], [address] [place], [place] [address]    Report Status 04/09/2019 FINAL  Final  Blood Culture (routine x 2)     Status: None (Preliminary result)   Collection Time: 04/06/19 10:55 AM   Specimen: BLOOD  Result Value Ref Range Status   Specimen Description BLOOD RAC  Final   Special Requests   Final    BOTTLES DRAWN AEROBIC AND ANAEROBIC Blood Culture results may not be optimal due to an excessive volume of blood received in culture bottles   Culture   Final    NO GROWTH 4 DAYS Performed at [facility], [address], [place], [place] [address]    Report Status PENDING  Incomplete  Urine culture     Status: Abnormal   Collection  Time: 04/06/19  9:18 PM   Specimen: In/Out Cath Urine  Result Value Ref Range Status   Specimen Description   Final    IN/OUT CATH URINE Performed at [facility], [address] [place], [place] [address]    Special Requests   Final    NONE Performed at [facility], [address] [place], [place] [address]    Culture >=100,000 COLONIES/mL YEAST (A)  Final   Report Status 04/08/2019 FINAL  Final  CULTURE, BLOOD (ROUTINE X 2) w Reflex to ID Panel     Status: None (Preliminary result)   Collection Time: 04/10/19  2:37 AM   Specimen: BLOOD  Result Value Ref Range Status   Specimen Description BLOOD RIGHT ANTECUBITAL  Final   Special Requests   Final    BOTTLES DRAWN AEROBIC AND ANAEROBIC Blood Culture results may not be optimal due to an  excessive volume of blood received in culture bottles   Culture   Final    NO GROWTH < 12 HOURS Performed at [facility], [address] [place], [place] [address]    Report Status PENDING  Incomplete  CULTURE, BLOOD (ROUTINE X 2) w Reflex to ID Panel     Status: None (Preliminary result)   Collection Time: 04/10/19  2:42 AM   Specimen: BLOOD  Result Value Ref Range Status   Specimen Description BLOOD BLOOD RIGHT FOREARM  Final   Special Requests   Final    BOTTLES DRAWN AEROBIC AND ANAEROBIC Blood Culture adequate volume   Culture   Final    NO GROWTH < 12 HOURS Performed at [facility], [address] [place], [place] [address]    Report Status PENDING  Incomplete          IMAGING    DG Chest Port 1 View  Result Date: 04/10/2019 CLINICAL DATA:  Acute respiratory failure EXAM: PORTABLE CHEST 1 VIEW COMPARISON:  04/08/2019 FINDINGS: Near diffuse bilateral airspace opacities, slightly worsened from the prior study. Moderate cardiomegaly. Small pleural effusions. IMPRESSION: Slight worsening of diffuse bilateral airspace opacities, likely indicating worsening multifocal pneumonia or alveolar  edema. Electronically Signed   By: Deatra Robinson M.D.   On: 04/10/2019 03:29         ASSESSMENT AND PLAN SYNOPSIS  SEVERE HYPOXIC RESP FAILURE FROM COVID 19 INFECTION  HIGH RISK FOR INTUBATION HIGH RISK FOR CARDIAC ARREST     Severe ACUTE Hypoxic and Hypercapnic Respiratory Failure High risk fro cardiac arrest High risk for intubation  LATE PHASE COVID 19 infection Progressive b/ infiltrates Permissive hypoxemia allowed o2 sats 80% Inhaled steroids and IV steroids pulmonary bronchial hygiene proning and incentive spirometry, flutter valve Out of bed to chair as tolerated Continue Heparin Lasix as tolerated   CARDIAC ICU monitoring  ID -continue IV abx as prescibed -follow up cultures  GI GI PROPHYLAXIS as indicated  NUTRITIONAL STATUS Diet as tolerated Constipation protocol as indicated  ENDO - will use ICU hypoglycemic\Hyperglycemia protocol if indicated   ELECTROLYTES -follow labs as needed -replace as needed -pharmacy consultation and following   DVT/GI PRX ordered TRANSFUSIONS AS NEEDED MONITOR FSBS ASSESS the need for LABS as needed   Critical Care Time devoted to patient care services described in this note is 32 minutes.   Overall, patient is critically ill, prognosis is guarded.    Lucie Leather, M.D.  [facility]  Medical Director [facility] Medical Director [facility] Cardio-Pulmonary Department

## 2019-04-10 NOTE — Progress Notes (Signed)
*  PRELIMINARY RESULTS* Echocardiogram 2D Echocardiogram has been performed.  [name] [name] 04/10/2019, 10:57 AM

## 2019-04-10 NOTE — Progress Notes (Signed)
Pharmacy Antibiotic Note  [name] [name] is a 58 y.o. female admitted on 04/06/2019 with CT revealing multifocal pneumonia.  Overnight, her Tmax was 102.5, but normal WBC.  She has a medical history significant for hypertension, hyperlipidemia, diabetes mellitus, GERD, hypothyroidism, depression, anxiety, OSA, rheumatoid arthritis, dCHF, C. difficile colitis, hernia, CKD stage III.   Pharmacy has been consulted for Cefepime dosing.  Allergy to Keflex noted, pt has tolerated Cefazolin in the past.  She is currently on antibiotic day 1.  Plan: Continue Cefepime 2gm IV q8hrs  Height: 5\' 9"  (175.3 cm) Weight: (!) 320 lb (145.2 kg) IBW/kg (Calculated) : 66.2  Temp (24hrs), Avg:99 F (37.2 C), Min:97.6 F (36.4 C), Max:102.5 F (39.2 C)  Recent Labs  Lab 04/06/19 1010 04/06/19 1202 04/06/19 1614 04/06/19 1817 04/07/19 0422 04/07/19 0613 04/08/19 0523 04/09/19 0629 04/10/19 0251  WBC 5.0  --   --   --  5.6  --  7.4 4.4 8.5  CREATININE 1.32*  --   --   --  1.09* 1.05* 1.06* 1.17* 1.33*  LATICACIDVEN 3.9* 2.2* 1.8 1.6  --   --   --   --   --     Estimated Creatinine Clearance: 72.1 mL/min (A) (by C-G formula based on SCr of 1.33 mg/dL (H)).    Allergies  Allergen Reactions  . Cephalexin Hives  . Codeine Palpitations, Nausea Only, Nausea And Vomiting, Rash and Shortness Of Breath    "makes heart fly, she gets flushed and passes out"  . Doxycycline Rash  . Propoxyphene Rash and Shortness Of Breath    Increase heart rate  . Sulfa Antibiotics Palpitations, Nausea Only, Shortness Of Breath and Hives    "makes heart fly, she gets flushed and passes out"  . Lovenox [Enoxaparin Sodium] Hives  . Hydrocodone Nausea And Vomiting    Hear racing & breaks out into a cold sweat.  . Meropenem Rash    Erythematous, hot, pruritic rash over arms, chest, back, abdomen, and face occurred at the end of meropenem infusion on 02/22/18    Antimicrobials this admission: Cefepime 12/22 >>   Fluconazole 12/20 x 1 Vancomycin 12/18 x 1  Dose adjustments this admission: None  Microbiology results: 12/22 Bcx NG < 12 hours 12/18 Bcx Coag negative Staph 1/4 bottles.  Likely contaminant. 12/18 Ucx > 100k yeast 12/18 MRSA PCR positive Sputum culture sent  Thank you for allowing pharmacy to be a part of this patient's care.  Gerald Dexter, PharmD Pharmacy Resident  04/10/2019 11:08 AM

## 2019-04-10 NOTE — Progress Notes (Signed)
Pharmacy Antibiotic Note  [name] [name] is a 58 y.o. female admitted on 04/06/2019 with pneumonia.  Pharmacy has been consulted for Cefepime dosing.  Allergy to Keflex noted, pt has tolerated Cefazolin in the past.  Plan: Cefepime 2gm IV q8hrs  Height: 5\' 6"  (167.6 cm) Weight: (!) 320 lb (145.2 kg) IBW/kg (Calculated) : 59.3  Temp (24hrs), Avg:98.9 F (37.2 C), Min:97.6 F (36.4 C), Max:102.5 F (39.2 C)  Recent Labs  Lab 04/06/19 1010 04/06/19 1202 04/06/19 1614 04/06/19 1817 04/07/19 0422 04/07/19 0613 04/08/19 0523 04/09/19 0629 04/10/19 0251  WBC 5.0  --   --   --  5.6  --  7.4 4.4 8.5  CREATININE 1.32*  --   --   --  1.09* 1.05* 1.06* 1.17* 1.33*  LATICACIDVEN 3.9* 2.2* 1.8 1.6  --   --   --   --   --     Estimated Creatinine Clearance: 69 mL/min (A) (by C-G formula based on SCr of 1.33 mg/dL (H)).    Allergies  Allergen Reactions  . Cephalexin Hives  . Codeine Palpitations, Nausea Only, Nausea And Vomiting, Rash and Shortness Of Breath    "makes heart fly, she gets flushed and passes out"  . Doxycycline Rash  . Propoxyphene Rash and Shortness Of Breath    Increase heart rate  . Sulfa Antibiotics Palpitations, Nausea Only, Shortness Of Breath and Hives    "makes heart fly, she gets flushed and passes out"  . Lovenox [Enoxaparin Sodium] Hives  . Hydrocodone Nausea And Vomiting    Hear racing & breaks out into a cold sweat.  . Meropenem Rash    Erythematous, hot, pruritic rash over arms, chest, back, abdomen, and face occurred at the end of meropenem infusion on 02/22/18    Antimicrobials this admission: Cefepime 12/22 >>   Dose adjustments this admission:   Microbiology results:  BCx:   UCx:    Sputum:    MRSA PCR:   Thank you for allowing pharmacy to be a part of this patient's care.  [name] 04/10/2019 4:16 AM

## 2019-04-11 ENCOUNTER — Inpatient Hospital Stay: Payer: Medicaid Other

## 2019-04-11 ENCOUNTER — Other Ambulatory Visit: Payer: Self-pay | Admitting: Internal Medicine

## 2019-04-11 DIAGNOSIS — U071 COVID-19: Secondary | ICD-10-CM

## 2019-04-11 DIAGNOSIS — J80 Acute respiratory distress syndrome: Secondary | ICD-10-CM

## 2019-04-11 LAB — COMPREHENSIVE METABOLIC PANEL
ALT: 17 U/L (ref 0–44)
AST: 18 U/L (ref 15–41)
Albumin: 2.4 g/dL — ABNORMAL LOW (ref 3.5–5.0)
Alkaline Phosphatase: 54 U/L (ref 38–126)
Anion gap: 10 (ref 5–15)
BUN: 42 mg/dL — ABNORMAL HIGH (ref 6–20)
CO2: 22 mmol/L (ref 22–32)
Calcium: 8.6 mg/dL — ABNORMAL LOW (ref 8.9–10.3)
Chloride: 106 mmol/L (ref 98–111)
Creatinine, Ser: 1.05 mg/dL — ABNORMAL HIGH (ref 0.44–1.00)
GFR calc Af Amer: >60 mL/min (ref >60)
GFR calc non Af Amer: 59 mL/min — ABNORMAL LOW (ref >60)
Glucose, Bld: 250 mg/dL — ABNORMAL HIGH (ref 70–99)
Potassium: 4.1 mmol/L (ref 3.5–5.1)
Sodium: 138 mmol/L (ref 135–145)
Total Bilirubin: 0.7 mg/dL (ref 0.3–1.2)
Total Protein: 7.3 g/dL (ref 6.5–8.1)

## 2019-04-11 LAB — CBC WITH DIFFERENTIAL/PLATELET
Abs Immature Granulocytes: 0.06 10*3/uL (ref 0.00–0.07)
Basophils Absolute: 0 10*3/uL (ref 0.0–0.1)
Basophils Relative: 0 %
Eosinophils Absolute: 0 10*3/uL (ref 0.0–0.5)
Eosinophils Relative: 0 %
HCT: 31.3 % — ABNORMAL LOW (ref 36.0–46.0)
Hemoglobin: 10.2 g/dL — ABNORMAL LOW (ref 12.0–15.0)
Immature Granulocytes: 1 %
Lymphocytes Relative: 11 %
Lymphs Abs: 0.6 10*3/uL — ABNORMAL LOW (ref 0.7–4.0)
MCH: 27.1 pg (ref 26.0–34.0)
MCHC: 32.6 g/dL (ref 30.0–36.0)
MCV: 83 fL (ref 80.0–100.0)
Monocytes Absolute: 0.4 10*3/uL (ref 0.1–1.0)
Monocytes Relative: 7 %
Neutro Abs: 4.5 10*3/uL (ref 1.7–7.7)
Neutrophils Relative %: 81 %
Platelets: 275 10*3/uL (ref 150–400)
RBC: 3.77 MIL/uL — ABNORMAL LOW (ref 3.87–5.11)
RDW: 15.3 % (ref 11.5–15.5)
WBC: 5.6 10*3/uL (ref 4.0–10.5)
nRBC: 0 % (ref 0.0–0.2)

## 2019-04-11 LAB — FERRITIN: Ferritin: 594 ng/mL — ABNORMAL HIGH (ref 11–307)

## 2019-04-11 LAB — CULTURE, BLOOD (ROUTINE X 2): Culture: NO GROWTH

## 2019-04-11 LAB — MAGNESIUM: Magnesium: 2.4 mg/dL (ref 1.7–2.4)

## 2019-04-11 LAB — GLUCOSE, CAPILLARY
Glucose-Capillary: 231 mg/dL — ABNORMAL HIGH (ref 70–99)
Glucose-Capillary: 260 mg/dL — ABNORMAL HIGH (ref 70–99)
Glucose-Capillary: 232 mg/dL — ABNORMAL HIGH (ref 70–99)
Glucose-Capillary: 235 mg/dL — ABNORMAL HIGH (ref 70–99)

## 2019-04-11 LAB — C-REACTIVE PROTEIN: CRP: 18.8 mg/dL — ABNORMAL HIGH (ref <1.0)

## 2019-04-11 LAB — HEPARIN LEVEL (UNFRACTIONATED): Heparin Unfractionated: 0.23 IU/mL — ABNORMAL LOW (ref 0.30–0.70)

## 2019-04-11 LAB — BRAIN NATRIURETIC PEPTIDE: B Natriuretic Peptide: 21 pg/mL (ref 0.0–100.0)

## 2019-04-11 LAB — FIBRIN DERIVATIVES D-DIMER (ARMC ONLY): Fibrin derivatives D-dimer (ARMC): 1053.55 ng/mL (FEU) — ABNORMAL HIGH (ref 0.00–499.00)

## 2019-04-11 MED ORDER — GUAIFENESIN-CODEINE 100-10 MG/5ML PO SOLN
5.0000 mL | Freq: Four times a day (QID) | ORAL | Status: DC | PRN
  Administered 2019-04-11 – 2019-04-13 (×5): 5 mL via ORAL
  Filled 2019-04-11 (×5): qty 5

## 2019-04-11 MED ORDER — ACETAZOLAMIDE SODIUM 500 MG IJ SOLR
500.0000 mg | Freq: Once | INTRAMUSCULAR | Status: AC
  Administered 2019-04-11: 500 mg via INTRAVENOUS
  Filled 2019-04-11: qty 500

## 2019-04-11 MED ORDER — METOLAZONE 5 MG PO TABS
10.0000 mg | ORAL_TABLET | Freq: Once | ORAL | Status: AC
  Administered 2019-04-11: 10 mg via ORAL
  Filled 2019-04-11: qty 2

## 2019-04-11 NOTE — Progress Notes (Signed)
CRITICAL CARE NOTE [name] [name] is an 58 y.o. female former smoker with a history of diabetes mellitus, morbid obesity and rheumatoid arthritis diagnosed with COVID-19 on December 4.  Admitted to [facility] campus from 8 December through 12 December received 5 days of remdesivir.  Also received dexamethasone.  She has been found to be on the late pulmonary phase of COVID-19 infection marked by intense inflammatory response.    CC  Severe ARDS Severe hypoxia  SUBJECTIVE Remains critically ill Prognosis is poor High risk for intubation Will need to address code status with patient   BP 131/66   Pulse 76   Temp 97.8 F (36.6 C) (Oral)   Resp (!) 22   Ht 5\' 9"  (1.753 m)   Wt (!) 145.2 kg   LMP 04/20/2001   SpO2 (!) 84%   BMI 47.26 kg/m    I/O last 3 completed shifts: In: 1426.2 [P.O.:600; I.V.:526.2; IV Piggyback:300] Out: 3800 [Urine:3800] No intake/output data recorded.  SpO2: (!) 84 % O2 Flow Rate (L/min): 40 L/min FiO2 (%): 100 %   SIGNIFICANT EVENTS 12/4: Diagnosed with COVID-19 12/8-12: Admitted to [facility] for management of COVID-19, received remdesivir 12/18: Presented with increasing shortness of breath and hypoxemia quiring high flow O2, bilateral pulmonary infiltrates consistent with late, pulmonary phase of COVID-19 disease.  Initiated COVID-19 management protocol for late pulmonary phase. 12/20: Was able to tolerate some proning last night.  Oxygenation remains marginal, requiring high flow 12/22 severe hypoxia resp failure on high flow [place] 12/23 severe hypoxia, patient refused intubation, will need discussion regarding CODE status    COVID-19 DISASTER DECLARATION:   FULL CONTACT PHYSICAL EXAMINATION WAS NOT POSSIBLE DUE TO TREATMENT OF COVID-19 AND   CONSERVATION OF PERSONAL PROTECTIVE EQUIPMENT, LIMITED EXAM FINDINGS INCLUDE-   Patient assessed or the symptoms described in the history of present illness.   In the context of the Global  COVID-19 pandemic, which necessitated consideration that the patient might be at risk for infection with the SARS-CoV-2 virus that causes COVID-19, Institutional protocols and algorithms that pertain to the evaluation of patients at risk for COVID-19 are in a state of rapid change based on information released by regulatory bodies including the CDC and federal and state organizations. These policies and algorithms were followed during the patient's care while in hospital.     PHYSICAL EXAMINATION:  GENERAL:critically ill appearing, +resp distress     CULTURE RESULTS   Recent Results (from the past 240 hour(s))  Blood culture (routine x 2)     Status: None   Collection Time: 04/01/19  9:52 PM   Specimen: BLOOD  Result Value Ref Range Status   Specimen Description BLOOD RIGHT ANTECUBITAL  Final   Special Requests   Final    BOTTLES DRAWN AEROBIC AND ANAEROBIC Blood Culture results may not be optimal due to an inadequate volume of blood received in culture bottles   Culture   Final    NO GROWTH 5 DAYS Performed at [facility], [address], [employer], [place]    Report Status 04/06/2019 FINAL  Final  Blood culture (routine x 2)     Status: None   Collection Time: 04/01/19  9:52 PM   Specimen: BLOOD  Result Value Ref Range Status   Specimen Description BLOOD LEFT ANTECUBITAL  Final   Special Requests   Final    BOTTLES DRAWN AEROBIC AND ANAEROBIC Blood Culture adequate volume   Culture   Final  NO GROWTH 5 DAYS Performed at [facility], [address] [place], [place] [address]    Report Status 04/06/2019 FINAL  Final  MRSA PCR Screening     Status: Abnormal   Collection Time: 04/06/19  9:48 AM   Specimen: Nasopharyngeal  Result Value Ref Range Status   MRSA by PCR POSITIVE (A) NEGATIVE Final    Comment:        The GeneXpert MRSA Assay (FDA approved for NASAL specimens only), is one component of a comprehensive MRSA  colonization surveillance program. It is not intended to diagnose MRSA infection nor to guide or monitor treatment for MRSA infections. RESULT CALLED TO, READ BACK BY AND VERIFIED WITH: JAMIE DEEM @ 4656 ON 04/07/2019 BY CAF Performed at [facility], [address] [place], [place] [address]   Blood Culture (routine x 2)     Status: Abnormal   Collection Time: 04/06/19 10:10 AM   Specimen: BLOOD  Result Value Ref Range Status   Specimen Description   Final    BLOOD RAC Performed at [facility], [address], [place], [place] [address]    Special Requests   Final    BOTTLES DRAWN AEROBIC AND ANAEROBIC Blood Culture adequate volume Performed at [facility], [address] [place], [place] [address]    Culture  Setup Time   Final    GRAM POSITIVE COCCI IN CLUSTERS AEROBIC BOTTLE ONLY CRITICAL RESULT CALLED TO, READ BACK BY AND VERIFIED WITH: ALEX CHAPPELL @0950  ON 04/07/2019 BY FMW Performed at [facility], [address] [place], [place] [address]    Culture (A)  Final    STAPHYLOCOCCUS SPECIES (COAGULASE NEGATIVE) THE SIGNIFICANCE OF ISOLATING THIS ORGANISM FROM A SINGLE SET OF BLOOD CULTURES WHEN MULTIPLE SETS ARE DRAWN IS UNCERTAIN. PLEASE NOTIFY THE MICROBIOLOGY DEPARTMENT WITHIN ONE WEEK IF SPECIATION AND SENSITIVITIES ARE REQUIRED. Performed at [facility], [place] [address] [place], [place] [address]    Report Status 04/09/2019 FINAL  Final  Blood Culture (routine x 2)     Status: None   Collection Time: 04/06/19 10:55 AM   Specimen: BLOOD  Result Value Ref Range Status   Specimen Description BLOOD RAC  Final   Special Requests   Final    BOTTLES DRAWN AEROBIC AND ANAEROBIC Blood Culture results may not be optimal due to an excessive volume of blood received in culture bottles   Culture   Final    NO GROWTH 5 DAYS Performed at [facility], [address], [place], [place] [address]    Report Status  04/11/2019 FINAL  Final  Urine culture     Status: Abnormal   Collection Time: 04/06/19  9:18 PM   Specimen: In/Out Cath Urine  Result Value Ref Range Status   Specimen Description   Final    IN/OUT CATH URINE Performed at [facility], [address], [place], [place] [address]    Special Requests   Final    NONE Performed at [facility], [address] [place], [place] [address]    Culture >=100,000 COLONIES/mL YEAST (A)  Final   Report Status 04/08/2019 FINAL  Final  CULTURE, BLOOD (ROUTINE X 2) w Reflex to ID Panel     Status: None (Preliminary result)   Collection Time: 04/10/19  2:37 AM   Specimen: BLOOD  Result Value Ref Range Status   Specimen Description BLOOD RIGHT ANTECUBITAL  Final   Special Requests   Final  BOTTLES DRAWN AEROBIC AND ANAEROBIC Blood Culture results may not be optimal due to an excessive volume of blood received in culture bottles   Culture  Setup Time   Final    GRAM POSITIVE COCCI ANAEROBIC BOTTLE ONLY CRITICAL RESULT CALLED TO, READ BACK BY AND VERIFIED WITH: Valrie HartSCOTT HALL [facility] [id number] 04/11/2019 HNM    Culture   Final    NO GROWTH 1 DAY Performed at [facility], [address] [place], [place] [address]    Report Status PENDING  Incomplete  CULTURE, BLOOD (ROUTINE X 2) w Reflex to ID Panel     Status: None (Preliminary result)   Collection Time: 04/10/19  2:42 AM   Specimen: BLOOD  Result Value Ref Range Status   Specimen Description BLOOD BLOOD RIGHT FOREARM  Final   Special Requests   Final    BOTTLES DRAWN AEROBIC AND ANAEROBIC Blood Culture adequate volume   Culture   Final    NO GROWTH 1 DAY Performed at [facility], [address] [place], [place] [address]    Report Status PENDING  Incomplete          IMAGING    ECHOCARDIOGRAM COMPLETE  Result Date: 04/10/2019   ECHOCARDIOGRAM REPORT   Patient Name:   [name] [name] Date of Exam: 04/10/2019 Medical Rec #:  [id number]        Height:       66.0 in Accession #:    [id number]      Weight:       320.0 lb Date of Birth:  1960/08/30       BSA:          2.44 m Patient Age:    58 years        BP:           104/75 mmHg Patient Gender: F               HR:           82 bpm. Exam Location:  [facility] Procedure: 2D Echo, Cardiac Doppler and Color Doppler Indications:     Acute respiratory insufficiency 518.82  History:         Patient has no prior history of Echocardiogram examinations.                  Risk Factors:Hypertension, Diabetes and Sleep Apnea.  Sonographer:     Cristela BlueJerry Hege RDCS (AE) Referring Phys:  [id number] Eugenie NorrieANA G BLAKENEY Diagnosing Phys: [place] HedgeKenneth Fath MD IMPRESSIONS  1. Left ventricular ejection fraction, by visual estimation, is 70 to 75%. The left ventricle has hyperdynamic function. Left ventricular septal wall thickness was mildly increased. Mildly increased left ventricular posterior wall thickness. There is mildly increased left ventricular hypertrophy.  2. Left ventricular diastolic parameters are consistent with Grade I diastolic dysfunction (impaired relaxation).  3. The left ventricle has no regional wall motion abnormalities.  4. Global right ventricle has normal systolic function.The right ventricular size is normal. No increase in right ventricular wall thickness.  5. Left atrial size was normal.  6. Right atrial size was normal.  7. The mitral valve was not well visualized. Trivial mitral valve regurgitation.  8. The tricuspid valve is not well visualized. Tricuspid valve regurgitation is mild.  9. The aortic valve was not well visualized. Aortic valve regurgitation is trivial. 10. The pulmonic valve was not well visualized. Pulmonic valve regurgitation is trivial. 11. The aortic root was not well visualized. 12. Normal pulmonary artery  systolic pressure. 13. The atrial septum is grossly normal. FINDINGS  Left Ventricle: Left ventricular ejection fraction, by visual estimation, is 70 to 75%. The left ventricle has hyperdynamic  function. The left ventricle has no regional wall motion abnormalities. Mildly increased left ventricular posterior wall thickness. There is mildly increased left ventricular hypertrophy. Left ventricular diastolic parameters are consistent with Grade I diastolic dysfunction (impaired relaxation). Right Ventricle: The right ventricular size is normal. No increase in right ventricular wall thickness. Global RV systolic function is has normal systolic function. The tricuspid regurgitant velocity is 1.93 m/s, and with an assumed right atrial pressure  of 10 mmHg, the estimated right ventricular systolic pressure is normal at 24.9 mmHg. Left Atrium: Left atrial size was normal in size. Right Atrium: Right atrial size was normal in size Pericardium: There is no evidence of pericardial effusion. Mitral Valve: The mitral valve was not well visualized. Trivial mitral valve regurgitation. Tricuspid Valve: The tricuspid valve is not well visualized. Tricuspid valve regurgitation is mild. Aortic Valve: The aortic valve was not well visualized. Aortic valve regurgitation is trivial. Aortic valve mean gradient measures 5.5 mmHg. Aortic valve peak gradient measures 10.3 mmHg. Pulmonic Valve: The pulmonic valve was not well visualized. Pulmonic valve regurgitation is trivial. Pulmonic regurgitation is trivial. Aorta: The aortic root was not well visualized. IAS/Shunts: The atrial septum is grossly normal.  LEFT VENTRICLE PLAX 2D LV IVS:        0.72 cm Diastology                        LV e' lateral:   5.44 cm/s                        LV E/e' lateral: 14.4                        LV e' medial:    6.85 cm/s                        LV E/e' medial:  11.5  RIGHT VENTRICLE RV Basal diam:  3.77 cm RV S prime:     9.79 cm/s TAPSE (M-mode): 4.3 cm LEFT ATRIUM           Index LA Vol (A2C): 47.3 ml 19.37 ml/m  AORTIC VALVE                    PULMONIC VALVE AV Vmax:           160.50 cm/s  PV Vmax:        0.69 m/s AV Vmean:          102.500  cm/s PV Peak grad:   1.9 mmHg AV VTI:            0.270 m      RVOT Peak grad: 4 mmHg AV Peak Grad:      10.3 mmHg AV Mean Grad:      5.5 mmHg LVOT Vmax:         117.00 cm/s LVOT Vmean:        91.100 cm/s LVOT VTI:          0.275 m LVOT/AV VTI ratio: 1.02 MITRAL VALVE                        TRICUSPID VALVE MV Area (PHT): 2.76 cm  TR Peak grad:   14.9 mmHg MV PHT:        79.75 msec           TR Vmax:        193.00 cm/s MV Decel Time: 275 msec MV E velocity: 78.60 cm/s 103 cm/s  SHUNTS MV A velocity: 85.40 cm/s 70.3 cm/s Systemic VTI: 0.28 m MV E/A ratio:  0.92       1.5  [place] HedgeKenneth Fath MD Electronically signed by [place] HedgeKenneth Fath MD Signature Date/Time: 04/10/2019/11:23:21 AM    Final     CBC    Component Value Date/Time   WBC 8.5 04/10/2019 0251   RBC 4.16 04/10/2019 0251   HGB 10.7 (L) 04/10/2019 0251   HGB 12.1 05/27/2014 1439   HCT 32.3 (L) 04/10/2019 0251   HCT 37.5 05/27/2014 1439   PLT 313 04/10/2019 0251   PLT 221 05/27/2014 1439   MCV 77.6 (L) 04/10/2019 0251   MCV 80 05/27/2014 1439   MCH 25.7 (L) 04/10/2019 0251   MCHC 33.1 04/10/2019 0251   RDW 15.6 (H) 04/10/2019 0251   RDW 17.3 (H) 05/27/2014 1439   LYMPHSABS 1.1 04/10/2019 0251   LYMPHSABS 1.6 12/03/2013 0511   MONOABS 0.4 04/10/2019 0251   MONOABS 0.5 12/03/2013 0511   EOSABS 0.0 04/10/2019 0251   EOSABS 0.0 12/03/2013 0511   BASOSABS 0.0 04/10/2019 0251   BASOSABS 0.1 12/03/2013 0511   BMP Latest Ref Rng & Units 04/11/2019 04/10/2019 04/09/2019  Glucose 70 - 99 mg/dL 045(W250(H) 84 098(J186(H)  BUN 6 - 20 mg/dL 19(J42(H) 47(W36(H) 29(F39(H)  Creatinine 0.44 - 1.00 mg/dL 6.21(H1.05(H) 0.86(V1.33(H) 7.84(O1.17(H)  BUN/Creat Ratio 9 - 23 - - -  Sodium 135 - 145 mmol/L 138 138 137  Potassium 3.5 - 5.1 mmol/L 4.1 3.1(L) 3.8  Chloride 98 - 111 mmol/L 106 105 104  CO2 22 - 32 mmol/L 22 24 20(L)  Calcium 8.9 - 10.3 mg/dL 9.6(E8.6(L) 9.5(M8.8(L) 8.4(X8.6(L)        ASSESSMENT AND PLAN SYNOPSIS  SEVERE HYPOXIC RESP FAILURE FROM COVID 19 INFECTION  HIGH RISK FOR  INTUBATION HIGH RISK FOR CARDIAC ARREST   Severe ACUTE Hypoxic and Hypercapnic Respiratory Failure ARDS with progressive iniiltrates obtain CXR attempt aggressive diureses   LATE PHASE COVID 19 INFECTION Permissive hypoxemia allowed o2 sats 80% Inhaled and IV steroids proning and incentive spirometry    CARDIAC ICU monitoring  INFECTIOUS DISEASE -continue antibiotics as prescribed   GI GI PROPHYLAXIS as indicated  NUTRITIONAL STATUS DIET--as tolerated Constipation protocol as indicated   ENDO - ICU hypoglycemic\Hyperglycemia protocol -check FSBS per protocol  ELECTROLYTES -follow labs as needed -replace as needed -pharmacy consultation and following    DVT/GI PRX ordered TRANSFUSIONS AS NEEDED MONITOR FSBS ASSESS the need for LABS as needed    Critical Care Time devoted to patient care services described in this note is 32 minutes.   Overall, patient is critically ill, prognosis is guarded.  Patient with Multiorgan failure and at high risk for cardiac arrest and death.    Lucie LeatherKurian David Teyla Skidgel, M.D.  [facility]  Medical Director [facility] Medical Director [facility] Card[facility]

## 2019-04-11 NOTE — Progress Notes (Signed)
Patient bleeding excessively from left arm IV site.  Dr. Mortimer Fries notified.  Per Dr. Mortimer Fries, instructed to hold heparin drip at this time.

## 2019-04-11 NOTE — Progress Notes (Signed)
PHARMACY - PHYSICIAN COMMUNICATION CRITICAL VALUE ALERT - BLOOD CULTURE IDENTIFICATION (BCID)  [name] [name] is an 58 y.o. female who presented to [facility] on 04/06/2019 with a chief complaint of SOB  Assessment:  Lab reports 1 of 4 bottles w/ GPC, possible contamination.  Pt currently afebrile, no leukocytosis on Cefepime.  Name of physician (or Provider) ContactedTana Conch, NP  Current antibiotics: Cefepime 2gm IV q8h  Changes to prescribed antibiotics recommended:  Patient is on recommended antibiotics - No changes needed, will continue to monitor  No results found for this or any previous visit.  [name] 04/11/2019  3:45 AM

## 2019-04-11 NOTE — Consult Note (Signed)
ANTICOAGULATION CONSULT NOTE  Pharmacy Consult for Heparin Infusion Indication: Hypercoagable state 2/2 COVID disease  Allergies  Allergen Reactions  . Cephalexin Hives  . Codeine Palpitations, Nausea Only, Nausea And Vomiting, Rash and Shortness Of Breath    "makes heart fly, she gets flushed and passes out"  . Doxycycline Rash  . Propoxyphene Rash and Shortness Of Breath    Increase heart rate  . Sulfa Antibiotics Palpitations, Nausea Only, Shortness Of Breath and Hives    "makes heart fly, she gets flushed and passes out"  . Lovenox [Enoxaparin Sodium] Hives  . Hydrocodone Nausea And Vomiting    Hear racing & breaks out into a cold sweat.  . Meropenem Rash    Erythematous, hot, pruritic rash over arms, chest, back, abdomen, and face occurred at the end of meropenem infusion on 02/22/18    Patient Measurements: Height: 5\' 9"  (175.3 cm) Weight: (!) 320 lb (145.2 kg) IBW/kg (Calculated) : 66.2 Heparin Dosing Weight: 95.4  Vital Signs: Temp: 97.8 F (36.6 C) (12/23 0400) Temp Source: Oral (12/23 0400) BP: 131/66 (12/23 0600) Pulse Rate: 76 (12/23 0600)  Labs: Recent Labs    04/09/19 0629 04/09/19 2105 04/10/19 0251 04/11/19 0326  HGB 9.7*  --  10.7*  --   HCT 27.0*  --  32.3*  --   PLT 231  --  313  --   HEPARINUNFRC 0.35 0.47 0.34 0.23*  CREATININE 1.17*  --  1.33* 1.05*    Estimated Creatinine Clearance: 91.3 mL/min (A) (by C-G formula based on SCr of 1.05 mg/dL (H)).   Medical History: Past Medical History:  Diagnosis Date  . Abdominal wall hernia 01/29/2013  . Anxiety   . Arthritis    Rheumatoid  . C. difficile colitis   . Chronic diastolic heart failure (HCC)   . Depression   . Diabetes mellitus    states no meds or diet restrictions  at present  . Diastolic CHF (HCC)   . Esophagitis   . Fluid retention   . GERD (gastroesophageal reflux disease)   . Hiatal hernia   . Hypertension   . Hypokalemia due to loss of potassium 10/21/2015   Overview:   Associated with 3 weeks of diarrhea  And QT prolongation.  . Hypothyroidism   . IBS (irritable bowel syndrome)   . Moderate episode of recurrent major depressive disorder (HCC) 06/03/2004  . Morbid obesity (HCC)   . MRSA (methicillin resistant Staphylococcus aureus) infection 11/2017   left inner thigh abcess  . Neurogenic bladder    has pacemaker  . Neuropathy   . Obesity   . Panic attacks   . Rheumatoid arthritis (HCC)   . Sleep apnea    STATES SEVERE, CANT TOLERATE MASK- LAST STUDY YEARS AGO    Medications:  No anticoagulation prior to admission per medication reconcilation  Assessment: 58 y/o F with history of anxiety, diabetes, gastric reflux, hypertension, CHF, obesity who presented to [facility] ED 12/18 with shortness of breath. Previous COVID diagnosis for which she was admitted to [facility] 12/8-12/12. D dimer elevated to 1405. CXR with bilateral patchy infiltration. CTA negative for PE. Pharmacy has been consulted to initiate heparin drip for hypercoagulable state secondary to COVID disease.  Per nurse, no interruptions with infusion.  Goal of Therapy:  Heparin level 0.3-0.7 units/ml Monitor platelets by anticoagulation protocol: Yes   Plan:  12/21 @ 1210 HL 0.76 supratherapeutic.  Will decrease current rate to 1500 units/hour Recheck HL at 1900 CBC with AM labs.  Hgb  trending down will continue to monitor.  12/21: HL @ 2105 = 0.47 12/22: HL @ 0251 = 0.34, therapeutic x 2 12/23: HL @ 0326 = 0.23, subtherapeutic  Will increase Heparin infusion to 1600 units/hr and recheck HL in 6 hours   Ena Dawley, PharmD 04/11/2019 6:17 AM

## 2019-04-11 NOTE — Consult Note (Signed)
ANTICOAGULATION CONSULT NOTE  Pharmacy Consult for Heparin Infusion Indication: Hypercoagable state 2/2 COVID disease  Allergies  Allergen Reactions  . Cephalexin Hives  . Codeine Palpitations, Nausea Only, Nausea And Vomiting, Rash and Shortness Of Breath    "makes heart fly, she gets flushed and passes out"  . Doxycycline Rash  . Propoxyphene Rash and Shortness Of Breath    Increase heart rate  . Sulfa Antibiotics Palpitations, Nausea Only, Shortness Of Breath and Hives    "makes heart fly, she gets flushed and passes out"  . Lovenox [Enoxaparin Sodium] Hives  . Hydrocodone Nausea And Vomiting    Hear racing & breaks out into a cold sweat.  . Meropenem Rash    Erythematous, hot, pruritic rash over arms, chest, back, abdomen, and face occurred at the end of meropenem infusion on 02/22/18    Patient Measurements: Height: 5\' 9"  (175.3 cm) Weight: (!) 320 lb (145.2 kg) IBW/kg (Calculated) : 66.2 Heparin Dosing Weight: 95.4  Vital Signs: Temp: 97.5 F (36.4 C) (12/23 1247) Temp Source: Axillary (12/23 1247) BP: 123/113 (12/23 1100) Pulse Rate: 81 (12/23 1200)  Labs: Recent Labs    04/09/19 0629 04/09/19 2105 04/10/19 0251 04/11/19 0326 04/11/19 0712  HGB 9.7*  --  10.7*  --  10.2*  HCT 27.0*  --  32.3*  --  31.3*  PLT 231  --  313  --  275  HEPARINUNFRC 0.35 0.47 0.34 0.23*  --   CREATININE 1.17*  --  1.33* 1.05*  --     Estimated Creatinine Clearance: 91.3 mL/min (A) (by C-G formula based on SCr of 1.05 mg/dL (H)).   Medical History: Past Medical History:  Diagnosis Date  . Abdominal wall hernia 01/29/2013  . Anxiety   . Arthritis    Rheumatoid  . C. difficile colitis   . Chronic diastolic heart failure (HCC)   . Depression   . Diabetes mellitus    states no meds or diet restrictions  at present  . Diastolic CHF (HCC)   . Esophagitis   . Fluid retention   . GERD (gastroesophageal reflux disease)   . Hiatal hernia   . Hypertension   . Hypokalemia due  to loss of potassium 10/21/2015   Overview:  Associated with 3 weeks of diarrhea  And QT prolongation.  . Hypothyroidism   . IBS (irritable bowel syndrome)   . Moderate episode of recurrent major depressive disorder (HCC) 06/03/2004  . Morbid obesity (HCC)   . MRSA (methicillin resistant Staphylococcus aureus) infection 11/2017   left inner thigh abcess  . Neurogenic bladder    has pacemaker  . Neuropathy   . Obesity   . Panic attacks   . Rheumatoid arthritis (HCC)   . Sleep apnea    STATES SEVERE, CANT TOLERATE MASK- LAST STUDY YEARS AGO    Medications:  No anticoagulation prior to admission per medication reconcilation  Assessment: 58 y/o F with history of anxiety, diabetes, gastric reflux, hypertension, CHF, obesity who presented to [facility] ED 12/18 with shortness of breath. Previous COVID diagnosis for which she was admitted to [facility] 12/8-12/12. D dimer elevated to 1405. CXR with bilateral patchy infiltration. CTA negative for PE. Pharmacy has been consulted to initiate heparin drip for hypercoagulable state secondary to COVID disease.   Goal of Therapy:  Heparin level 0.3-0.7 units/ml Monitor platelets by anticoagulation protocol: Yes   Plan:  Patient bled extensively through left arm IV site (12/23), and therapeutic heparin is on hold currently.  Spoke with  nurse regarding SCDs, as chemical DVT prophylaxis should not be used either.  Nurse said she will re-enforce importance of SCDs.  Pharmacy will follow up with AM rounds.  Gerald Dexter, PharmD 04/11/2019 2:05 PM

## 2019-04-12 LAB — CBC WITH DIFFERENTIAL/PLATELET
Abs Immature Granulocytes: 0.06 10*3/uL (ref 0.00–0.07)
Basophils Absolute: 0 10*3/uL (ref 0.0–0.1)
Basophils Relative: 0 %
Eosinophils Absolute: 0 10*3/uL (ref 0.0–0.5)
Eosinophils Relative: 0 %
HCT: 30 % — ABNORMAL LOW (ref 36.0–46.0)
Hemoglobin: 10 g/dL — ABNORMAL LOW (ref 12.0–15.0)
Immature Granulocytes: 1 %
Lymphocytes Relative: 8 %
Lymphs Abs: 0.5 10*3/uL — ABNORMAL LOW (ref 0.7–4.0)
MCH: 28.4 pg (ref 26.0–34.0)
MCHC: 33.3 g/dL (ref 30.0–36.0)
MCV: 85.2 fL (ref 80.0–100.0)
Monocytes Absolute: 0.4 10*3/uL (ref 0.1–1.0)
Monocytes Relative: 5 %
Neutro Abs: 5.9 10*3/uL (ref 1.7–7.7)
Neutrophils Relative %: 86 %
Platelets: 297 10*3/uL (ref 150–400)
RBC: 3.52 MIL/uL — ABNORMAL LOW (ref 3.87–5.11)
RDW: 15.3 % (ref 11.5–15.5)
WBC: 6.8 10*3/uL (ref 4.0–10.5)
nRBC: 0 % (ref 0.0–0.2)

## 2019-04-12 LAB — COMPREHENSIVE METABOLIC PANEL
ALT: 16 U/L (ref 0–44)
AST: 14 U/L — ABNORMAL LOW (ref 15–41)
Albumin: 2.3 g/dL — ABNORMAL LOW (ref 3.5–5.0)
Alkaline Phosphatase: 49 U/L (ref 38–126)
Anion gap: 9 (ref 5–15)
BUN: 37 mg/dL — ABNORMAL HIGH (ref 6–20)
CO2: 22 mmol/L (ref 22–32)
Calcium: 8.6 mg/dL — ABNORMAL LOW (ref 8.9–10.3)
Chloride: 108 mmol/L (ref 98–111)
Creatinine, Ser: 1 mg/dL (ref 0.44–1.00)
GFR calc Af Amer: >60 mL/min (ref >60)
GFR calc non Af Amer: >60 mL/min (ref >60)
Glucose, Bld: 220 mg/dL — ABNORMAL HIGH (ref 70–99)
Potassium: 4.1 mmol/L (ref 3.5–5.1)
Sodium: 139 mmol/L (ref 135–145)
Total Bilirubin: 0.7 mg/dL (ref 0.3–1.2)
Total Protein: 7.3 g/dL (ref 6.5–8.1)

## 2019-04-12 LAB — HEPARIN LEVEL (UNFRACTIONATED): Heparin Unfractionated: 0.23 IU/mL — ABNORMAL LOW (ref 0.30–0.70)

## 2019-04-12 LAB — FIBRIN DERIVATIVES D-DIMER (ARMC ONLY): Fibrin derivatives D-dimer (ARMC): 1276.73 ng/mL (FEU) — ABNORMAL HIGH (ref 0.00–499.00)

## 2019-04-12 LAB — GLUCOSE, CAPILLARY
Glucose-Capillary: 221 mg/dL — ABNORMAL HIGH (ref 70–99)
Glucose-Capillary: 178 mg/dL — ABNORMAL HIGH (ref 70–99)
Glucose-Capillary: 276 mg/dL — ABNORMAL HIGH (ref 70–99)
Glucose-Capillary: 265 mg/dL — ABNORMAL HIGH (ref 70–99)

## 2019-04-12 LAB — MAGNESIUM: Magnesium: 2.5 mg/dL — ABNORMAL HIGH (ref 1.7–2.4)

## 2019-04-12 LAB — C-REACTIVE PROTEIN: CRP: 7.9 mg/dL — ABNORMAL HIGH (ref <1.0)

## 2019-04-12 LAB — PROCALCITONIN: Procalcitonin: <0.1 ng/mL

## 2019-04-12 LAB — PHOSPHORUS: Phosphorus: 4.3 mg/dL (ref 2.5–4.6)

## 2019-04-12 LAB — FERRITIN: Ferritin: 510 ng/mL — ABNORMAL HIGH (ref 11–307)

## 2019-04-12 MED ORDER — HEPARIN (PORCINE) 25000 UT/250ML-% IV SOLN
1300.0000 [IU]/h | INTRAVENOUS | Status: DC
  Administered 2019-04-12: 1500 [IU]/h via INTRAVENOUS
  Administered 2019-04-13 – 2019-04-17 (×5): 1700 [IU]/h via INTRAVENOUS
  Administered 2019-04-17: 1300 [IU]/h via INTRAVENOUS
  Filled 2019-04-12 (×9): qty 250

## 2019-04-12 MED ORDER — BLISTEX MEDICATED EX OINT
TOPICAL_OINTMENT | CUTANEOUS | Status: DC | PRN
  Filled 2019-04-12: qty 6.3

## 2019-04-12 MED ORDER — HEPARIN BOLUS VIA INFUSION
1400.0000 [IU] | Freq: Once | INTRAVENOUS | Status: AC
  Administered 2019-04-12: 1400 [IU] via INTRAVENOUS
  Filled 2019-04-12: qty 1400

## 2019-04-12 NOTE — Consult Note (Signed)
ANTICOAGULATION CONSULT NOTE  Pharmacy Consult for Heparin Infusion Indication: Hypercoagable state 2/2 COVID disease  Allergies  Allergen Reactions  . Cephalexin Hives  . Codeine Palpitations, Nausea Only, Nausea And Vomiting, Rash and Shortness Of Breath    "makes heart fly, she gets flushed and passes out"  . Doxycycline Rash  . Propoxyphene Rash and Shortness Of Breath    Increase heart rate  . Sulfa Antibiotics Palpitations, Nausea Only, Shortness Of Breath and Hives    "makes heart fly, she gets flushed and passes out"  . Lovenox [Enoxaparin Sodium] Hives  . Hydrocodone Nausea And Vomiting    Hear racing & breaks out into a cold sweat.  . Meropenem Rash    Erythematous, hot, pruritic rash over arms, chest, back, abdomen, and face occurred at the end of meropenem infusion on 02/22/18    Patient Measurements: Height: 5\' 9"  (175.3 cm) Weight: (!) 320 lb (145.2 kg) IBW/kg (Calculated) : 66.2 Heparin Dosing Weight: 95.4  Vital Signs: Temp: 98.2 F (36.8 C) (12/24 1600) Temp Source: Axillary (12/24 1600) BP: 125/101 (12/24 1800) Pulse Rate: 77 (12/24 1800)  Labs: Recent Labs    04/10/19 0251 04/11/19 0326 04/11/19 0712 04/12/19 0434 04/12/19 1951  HGB 10.7*  --  10.2* 10.0*  --   HCT 32.3*  --  31.3* 30.0*  --   PLT 313  --  275 297  --   HEPARINUNFRC 0.34 0.23*  --   --  0.23*  CREATININE 1.33* 1.05*  --  1.00  --     Estimated Creatinine Clearance: 95.8 mL/min (by C-G formula based on SCr of 1 mg/dL).   Medical History: Past Medical History:  Diagnosis Date  . Abdominal wall hernia 01/29/2013  . Anxiety   . Arthritis    Rheumatoid  . C. difficile colitis   . Chronic diastolic heart failure (HCC)   . Depression   . Diabetes mellitus    states no meds or diet restrictions  at present  . Diastolic CHF (HCC)   . Esophagitis   . Fluid retention   . GERD (gastroesophageal reflux disease)   . Hiatal hernia   . Hypertension   . Hypokalemia due to loss  of potassium 10/21/2015   Overview:  Associated with 3 weeks of diarrhea  And QT prolongation.  . Hypothyroidism   . IBS (irritable bowel syndrome)   . Moderate episode of recurrent major depressive disorder (HCC) 06/03/2004  . Morbid obesity (HCC)   . MRSA (methicillin resistant Staphylococcus aureus) infection 11/2017   left inner thigh abcess  . Neurogenic bladder    has pacemaker  . Neuropathy   . Obesity   . Panic attacks   . Rheumatoid arthritis (HCC)   . Sleep apnea    STATES SEVERE, CANT TOLERATE MASK- LAST STUDY YEARS AGO    Medications:  No anticoagulation prior to admission per medication reconcilation  Assessment: 58 y/o F with history of anxiety, diabetes, gastric reflux, hypertension, CHF, obesity who presented to [facility] ED 12/18 with shortness of breath. Previous COVID diagnosis for which she was admitted to [facility] 12/8-12/12. D dimer elevated to 1276.  On 12/23, patient bled extensively through left arm IV site, and therapeutic heparin was placed on hold.   Pharmacy has been consulted to initiate heparin drip for hypercoagulable state secondary to COVID disease.  Goal of Therapy:  Heparin level 0.3-0.7 units/ml Monitor platelets by anticoagulation protocol: Yes   Plan:  Discussed with MD during rounds, and will restart therapeutic  anticoagulation. Will resume heparin at 1500 units/hour with no bolus due to recent bleed. Pharmacy will check 6-hour heparin level at 2000, and follow CBC with AM labs.   12/24 :  HL @ 1951 = 0.23 Will order Heparin 1400 units IV X 1 bolus and increase drip rate to 1700 units/hr. Will recheck HL 6 hrs after rate change.   Melvin Marmo D, PharmD 04/12/2019 10:25 PM

## 2019-04-12 NOTE — Consult Note (Addendum)
ANTICOAGULATION CONSULT NOTE  Pharmacy Consult for Heparin Infusion Indication: Hypercoagable state 2/2 COVID disease  Allergies  Allergen Reactions  . Cephalexin Hives  . Codeine Palpitations, Nausea Only, Nausea And Vomiting, Rash and Shortness Of Breath    "makes heart fly, she gets flushed and passes out"  . Doxycycline Rash  . Propoxyphene Rash and Shortness Of Breath    Increase heart rate  . Sulfa Antibiotics Palpitations, Nausea Only, Shortness Of Breath and Hives    "makes heart fly, she gets flushed and passes out"  . Lovenox [Enoxaparin Sodium] Hives  . Hydrocodone Nausea And Vomiting    Hear racing & breaks out into a cold sweat.  . Meropenem Rash    Erythematous, hot, pruritic rash over arms, chest, back, abdomen, and face occurred at the end of meropenem infusion on 02/22/18    Patient Measurements: Height: 5\' 9"  (175.3 cm) Weight: (!) 320 lb (145.2 kg) IBW/kg (Calculated) : 66.2 Heparin Dosing Weight: 95.4  Vital Signs: Temp: 98.3 F (36.8 C) (12/24 0900) Temp Source: Axillary (12/24 0900) BP: 104/69 (12/24 0900) Pulse Rate: 78 (12/24 1000)  Labs: Recent Labs    04/09/19 2105 04/10/19 0251 04/11/19 0326 04/11/19 0712 04/12/19 0434  HGB  --  10.7*  --  10.2* 10.0*  HCT  --  32.3*  --  31.3* 30.0*  PLT  --  313  --  275 297  HEPARINUNFRC 0.47 0.34 0.23*  --   --   CREATININE  --  1.33* 1.05*  --  1.00    Estimated Creatinine Clearance: 95.8 mL/min (by C-G formula based on SCr of 1 mg/dL).   Medical History: Past Medical History:  Diagnosis Date  . Abdominal wall hernia 01/29/2013  . Anxiety   . Arthritis    Rheumatoid  . C. difficile colitis   . Chronic diastolic heart failure ([place])   . Depression   . Diabetes mellitus    states no meds or diet restrictions  at present  . Diastolic CHF ([place])   . Esophagitis   . Fluid retention   . GERD (gastroesophageal reflux disease)   . Hiatal hernia   . Hypertension   . Hypokalemia due to loss of  potassium 10/21/2015   Overview:  Associated with 3 weeks of diarrhea  And QT prolongation.  . Hypothyroidism   . IBS (irritable bowel syndrome)   . Moderate episode of recurrent major depressive disorder ([place]) 06/03/2004  . Morbid obesity ([place])   . MRSA (methicillin resistant Staphylococcus aureus) infection 11/2017   left inner thigh abcess  . Neurogenic bladder    has pacemaker  . Neuropathy   . Obesity   . Panic attacks   . Rheumatoid arthritis ([place])   . Sleep apnea    STATES SEVERE, CANT TOLERATE MASK- LAST STUDY YEARS AGO    Medications:  No anticoagulation prior to admission per medication reconcilation  Assessment: 58 y/o F with history of anxiety, diabetes, gastric reflux, hypertension, CHF, obesity who presented to [facility] ED 12/18 with shortness of breath. Previous COVID diagnosis for which she was admitted to [facility] 12/8-12/12. D dimer elevated to 1276.  On 12/23, patient bled extensively through left arm IV site, and therapeutic heparin was placed on hold.   Pharmacy has been consulted to initiate heparin drip for hypercoagulable state secondary to COVID disease.  Goal of Therapy:  Heparin level 0.3-0.7 units/ml Monitor platelets by anticoagulation protocol: Yes   Plan:  Discussed with MD during rounds, and will restart therapeutic  anticoagulation. Will resume heparin at 1500 units/hour with no bolus due to recent bleed. Pharmacy will check 6-hour heparin level at 2000, and follow CBC with AM labs.   Cherly Hensen, PharmD 04/12/2019 11:06 AM

## 2019-04-12 NOTE — Progress Notes (Signed)
CRITICAL CARE NOTE  [name] [name] an 58 y.o.femaleformer smoker with a history of diabetes mellitus, morbid obesity and rheumatoid arthritis diagnosed with COVID-19 on December 4. Admitted to [place] from 8 December through 12 December received 5 days of remdesivir. Also received dexamethasone. She has been found to be on the late pulmonary phase of COVID-19 infection marked by intense inflammatory response. Severe hypoxia and ARDS     CC  follow up respiratory failure  SUBJECTIVE Patient remains critically ill Prognosis is guarded Remains full code High risk for intubation   BP 115/63   Pulse (!) 55   Temp 98.1 F (36.7 C) (Oral)   Resp (!) 23   Ht 5\' 9"  (1.753 m)   Wt (!) 145.2 kg   LMP 04/20/2001   SpO2 (!) 89%   BMI 47.26 kg/m    I/O last 3 completed shifts: In: 926.1 [P.O.:240; I.V.:252.4; IV Piggyback:433.8] Out: 2925 [Urine:2925] No intake/output data recorded.  SpO2: (!) 89 % O2 Flow Rate (L/min): 40 L/min FiO2 (%): 98 %    SIGNIFICANT EVENTS 12/4: Diagnosed with COVID-19 12/8-12:Admitted to [facility] for management of COVID-19, received remdesivir 12/18: Presented with increasing shortness of breath and hypoxemia quiring high flow O2, bilateral pulmonary infiltrates consistent with late, pulmonary phase of COVID-19 disease. Initiated COVID-19 management protocol for late pulmonary phase. 12/20: Was able to tolerate some proning last night. Oxygenation remains marginal, requiring high flow 12/22 severe hypoxia resp failure on high flow [place] 12/23 severe hypoxia, patient refused intubation, will need discussion regarding CODE status 12/24 remains very hypoxic    Review of Systems:  +SOB Other:  All other systems negative  COVID-19 DISASTER DECLARATION:   FULL CONTACT PHYSICAL EXAMINATION WAS NOT POSSIBLE DUE TO TREATMENT OF COVID-19 AND   CONSERVATION OF PERSONAL PROTECTIVE EQUIPMENT, LIMITED EXAM FINDINGS INCLUDE-   Patient assessed or the symptoms described in the history of present illness.   In the context of the Global COVID-19 pandemic, which necessitated consideration that the patient might be at risk for infection with the SARS-CoV-2 virus that causes COVID-19, Institutional protocols and algorithms that pertain to the evaluation of patients at risk for COVID-19 are in a state of rapid change based on information released by regulatory bodies including the CDC and federal and state organizations. These policies and algorithms were followed during the patient's care while in hospital.     MEDICATIONS: I have reviewed all medications and confirmed regimen as documented   CULTURE RESULTS   Recent Results (from the past 240 hour(s))  MRSA PCR Screening     Status: Abnormal   Collection Time: 04/06/19  9:48 AM   Specimen: Nasopharyngeal  Result Value Ref Range Status   MRSA by PCR POSITIVE (A) NEGATIVE Final    Comment:        The GeneXpert MRSA Assay (FDA approved for NASAL specimens only), is one component of a comprehensive MRSA colonization surveillance program. It is not intended to diagnose MRSA infection nor to guide or monitor treatment for MRSA infections. RESULT CALLED TO, READ BACK BY AND VERIFIED WITH: JAMIE DEEM @ 9371 ON 04/07/2019 BY CAF Performed at [facility], [address] [place], [place] [address]   Blood Culture (routine x 2)     Status: Abnormal   Collection Time: 04/06/19 10:10 AM   Specimen: BLOOD  Result Value Ref Range Status   Specimen Description   Final    BLOOD RAC Performed at [facility], [address] [place], [place] [address]    Special Requests   Final    BOTTLES DRAWN AEROBIC AND ANAEROBIC Blood Culture adequate volume Performed at [facility], [address] [place], [place] [address]    Culture  Setup Time   Final    GRAM POSITIVE COCCI IN CLUSTERS AEROBIC BOTTLE ONLY CRITICAL RESULT CALLED TO, READ  BACK BY AND VERIFIED WITH: ALEX CHAPPELL @0950  ON 04/07/2019 BY FMW Performed at [facility], [address] [place], [place] [address]    Culture (A)  Final    STAPHYLOCOCCUS SPECIES (COAGULASE NEGATIVE) THE SIGNIFICANCE OF ISOLATING THIS ORGANISM FROM A SINGLE SET OF BLOOD CULTURES WHEN MULTIPLE SETS ARE DRAWN IS UNCERTAIN. PLEASE NOTIFY THE MICROBIOLOGY DEPARTMENT WITHIN ONE WEEK IF SPECIATION AND SENSITIVITIES ARE REQUIRED. Performed at [facility], [address], [place], [place] [address]    Report Status 04/09/2019 FINAL  Final  Blood Culture (routine x 2)     Status: None   Collection Time: 04/06/19 10:55 AM   Specimen: BLOOD  Result Value Ref Range Status   Specimen Description BLOOD RAC  Final   Special Requests   Final    BOTTLES DRAWN AEROBIC AND ANAEROBIC Blood Culture results may not be optimal due to an excessive volume of blood received in culture bottles   Culture   Final    NO GROWTH 5 DAYS Performed at [facility], [address], [place], [place] [address]    Report Status 04/11/2019 FINAL  Final  Urine culture     Status: Abnormal   Collection Time: 04/06/19  9:18 PM   Specimen: In/Out Cath Urine  Result Value Ref Range Status   Specimen Description   Final    IN/OUT CATH URINE Performed at [facility], [address] [place], [place] [address]    Special Requests   Final    NONE Performed at [facility], [address] [place], [place] [address]    Culture >=100,000 COLONIES/mL YEAST (A)  Final   Report Status 04/08/2019 FINAL  Final  CULTURE, BLOOD (ROUTINE X 2) w Reflex to ID Panel     Status: None (Preliminary result)   Collection Time: 04/10/19  2:37 AM   Specimen: BLOOD  Result Value Ref Range Status   Specimen Description   Final    BLOOD RIGHT ANTECUBITAL Performed at [facility], [address] [place], [place] [address]    Special Requests   Final    BOTTLES DRAWN AEROBIC AND  ANAEROBIC Blood Culture results may not be optimal due to an excessive volume of blood received in culture bottles Performed at [facility], [address] [place], [place] [address]    Culture  Setup Time   Final    GRAM POSITIVE COCCI ANAEROBIC BOTTLE ONLY CRITICAL RESULT CALLED TO, READ BACK BY AND VERIFIED WITH: Valrie Hart [facility] 1949 04/11/2019 HNM Performed at [facility], [address], [place], [place] [address]    Culture GRAM POSITIVE COCCI  Final   Report Status PENDING  Incomplete  CULTURE, BLOOD (ROUTINE X 2) w Reflex to ID Panel     Status: None (Preliminary result)   Collection Time: 04/10/19  2:42 AM   Specimen: BLOOD  Result Value Ref Range Status   Specimen Description BLOOD BLOOD RIGHT FOREARM  Final   Special Requests   Final    BOTTLES DRAWN AEROBIC AND ANAEROBIC Blood Culture adequate volume   Culture  Final    NO GROWTH 1 DAY Performed at [facility], [address] [place], [place] [address]    Report Status PENDING  Incomplete          IMAGING    DG Chest Port 1 View  Result Date: 04/11/2019 CLINICAL DATA:  Diagnosed with COVID-19 on 03/23/2019, previously hospitalized, respiratory failure, history diabetes mellitus, CHF, hypertension, rheumatoid arthritis EXAM: PORTABLE CHEST 1 VIEW COMPARISON:  Portable exam 0801 hours compared to 04/10/2019 FINDINGS: Enlargement of cardiac silhouette again seen. Stable mediastinal contours. Persistent patchy BILATERAL pulmonary infiltrates consistent with multifocal pneumonia. No pleural effusion or pneumothorax. IMPRESSION: Persistent patchy BILATERAL pulmonary infiltrates consistent with multifocal pneumonia, not significantly changed. Electronically Signed   By: Ulyses Southward M.D.   On: 04/11/2019 08:16         ASSESSMENT AND PLAN SYNOPSIS   SEVERE HYPOXIC RESP FAILURE FROM COVID 19 INFECTION with ARDS HIGH RISK FOR INTUBATION HIGH RISK FOR CARDIAC ARREST   SEVERE  ACUTE HYPOXIC RESP FAILURE AND HYPERCAPNIC FAILURE WITH ARDS WITH PROGRESSIVE COVID 19 INFECTION AND PNEUMONIA AGGRESSIVE DIURESIS AS TOLERATED   LATE PHASE COVID 19 INFECTION PERMISSIVE HYPOXIA 02 SAT GOAL >80% ENCORAGE PRONING, BRONCHIAL HYGIENE    CARDIAC ICU monitoring  ID -continue IV abx as prescibed -follow up cultures  GI GI PROPHYLAXIS as indicated  NUTRITIONAL STATUS DIET-->AS TOLERATED Constipation protocol as indicated  ENDO - will use ICU hypoglycemic\Hyperglycemia protocol if indicated   ELECTROLYTES -follow labs as needed -replace as needed -pharmacy consultation and following   DVT/GI PRX ordered TRANSFUSIONS AS NEEDED MONITOR FSBS ASSESS the need for LABS as needed   Critical Care Time devoted to patient care services described in this note is 35 minutes.   Overall, patient is critically ill, prognosis is guarded.  PROGNOSIS IS POOR REMAINS FULL CODE   Lucie Leather, M.D.  [facility]  Medical Director [facility] Medical Director [facility] Cardio-Pulmonary Department

## 2019-04-13 LAB — GLUCOSE, CAPILLARY
Glucose-Capillary: 147 mg/dL — ABNORMAL HIGH (ref 70–99)
Glucose-Capillary: 155 mg/dL — ABNORMAL HIGH (ref 70–99)
Glucose-Capillary: 207 mg/dL — ABNORMAL HIGH (ref 70–99)
Glucose-Capillary: 105 mg/dL — ABNORMAL HIGH (ref 70–99)

## 2019-04-13 LAB — BASIC METABOLIC PANEL
Anion gap: 10 (ref 5–15)
BUN: 33 mg/dL — ABNORMAL HIGH (ref 6–20)
CO2: 22 mmol/L (ref 22–32)
Calcium: 8.9 mg/dL (ref 8.9–10.3)
Chloride: 104 mmol/L (ref 98–111)
Creatinine, Ser: 0.91 mg/dL (ref 0.44–1.00)
GFR calc Af Amer: >60 mL/min (ref >60)
GFR calc non Af Amer: >60 mL/min (ref >60)
Glucose, Bld: 211 mg/dL — ABNORMAL HIGH (ref 70–99)
Potassium: 4.1 mmol/L (ref 3.5–5.1)
Sodium: 136 mmol/L (ref 135–145)

## 2019-04-13 LAB — HEPARIN LEVEL (UNFRACTIONATED)
Heparin Unfractionated: 0.42 IU/mL (ref 0.30–0.70)
Heparin Unfractionated: 0.5 IU/mL (ref 0.30–0.70)

## 2019-04-13 LAB — CULTURE, BLOOD (ROUTINE X 2)

## 2019-04-13 MED ORDER — GUAIFENESIN 100 MG/5ML PO SOLN
5.0000 mL | ORAL | Status: DC | PRN
  Administered 2019-04-13 – 2019-05-04 (×33): 100 mg via ORAL
  Filled 2019-04-13 (×37): qty 5

## 2019-04-13 MED ORDER — POLYETHYLENE GLYCOL 3350 17 G PO PACK
17.0000 g | PACK | Freq: Every day | ORAL | Status: DC | PRN
  Administered 2019-05-01: 09:00:00 17 g via ORAL
  Filled 2019-04-13: qty 1

## 2019-04-13 NOTE — Progress Notes (Signed)
Patient has multiple allergies including ingredients in her cough medicine (Codiene).  I did not feel comfortable giving that medication, so since she did not ask for it on my shift, I did not give it to her.  Patient has been sad today.  When she speaks on the phone, her oxygen level drops to the 70s.  MD and RN reminded her to please limit talking and focus on breathing and resting.  Patient was told to prone but did not want to do so.  Phillis Knack, RN

## 2019-04-13 NOTE — Progress Notes (Signed)
eLink Physician-Brief Progress Note Patient Name: [name] [name] DOB: 01-09-1961 MRN: [id number]   Date of Service  04/13/2019  HPI/Events of Note  Pt had Guaifenesin with codeine ordered but she's allergic to Codeine.  eICU Interventions  Cough medicine changed to plain guaifenesin.        Frederik Pear 04/13/2019, 9:43 PM

## 2019-04-13 NOTE — Progress Notes (Signed)
Called [facility] to request cough medication for the patient that does not contain codeine since the patient is allergic to this medication. [facility] MD said he would change it.

## 2019-04-13 NOTE — Progress Notes (Signed)
Patient is in a better mood today.  RN explained in detail about why moving is better for the lungs and that most of our lung tissue is in the back.  Patient rolled some to her side once she understood the importance of changing positions.   Patient is receptive to teaching, she needs to understand the "why" of each thing we ask her to do.  Patient is spiritual, she likes to talk about God and finds comfort in prayer.  RN encouraged her to pray and was supportive.  Phillis Knack, RN

## 2019-04-13 NOTE — Consult Note (Signed)
ANTICOAGULATION CONSULT NOTE  Pharmacy Consult for Heparin Infusion Indication: Hypercoagable state 2/2 COVID disease  Allergies  Allergen Reactions  . Cephalexin Hives  . Codeine Palpitations, Nausea Only, Nausea And Vomiting, Rash and Shortness Of Breath    "makes heart fly, she gets flushed and passes out"  . Doxycycline Rash  . Propoxyphene Rash and Shortness Of Breath    Increase heart rate  . Sulfa Antibiotics Palpitations, Nausea Only, Shortness Of Breath and Hives    "makes heart fly, she gets flushed and passes out"  . Lovenox [Enoxaparin Sodium] Hives  . Hydrocodone Nausea And Vomiting    Hear racing & breaks out into a cold sweat.  . Meropenem Rash    Erythematous, hot, pruritic rash over arms, chest, back, abdomen, and face occurred at the end of meropenem infusion on 02/22/18   Patient Measurements: Height: 5\' 9"  (175.3 cm) Weight: (!) 320 lb (145.2 kg) IBW/kg (Calculated) : 66.2 Heparin Dosing Weight: 95.4  Vital Signs: Temp: 98.1 F (36.7 C) (12/25 0600) Temp Source: Axillary (12/25 0600) BP: 103/78 (12/25 0500) Pulse Rate: 77 (12/25 0600)  Labs: Recent Labs    04/11/19 0326 04/11/19 0712 04/12/19 0434 04/12/19 1951 04/13/19 0521  HGB  --  10.2* 10.0*  --   --   HCT  --  31.3* 30.0*  --   --   PLT  --  275 297  --   --   HEPARINUNFRC 0.23*  --   --  0.23* 0.42  CREATININE 1.05*  --  1.00  --  0.91    Estimated Creatinine Clearance: 105.3 mL/min (by C-G formula based on SCr of 0.91 mg/dL).   Medical History: Past Medical History:  Diagnosis Date  . Abdominal wall hernia 01/29/2013  . Anxiety   . Arthritis    Rheumatoid  . C. difficile colitis   . Chronic diastolic heart failure (HCC)   . Depression   . Diabetes mellitus    states no meds or diet restrictions  at present  . Diastolic CHF (HCC)   . Esophagitis   . Fluid retention   . GERD (gastroesophageal reflux disease)   . Hiatal hernia   . Hypertension   . Hypokalemia due to loss of  potassium 10/21/2015   Overview:  Associated with 3 weeks of diarrhea  And QT prolongation.  . Hypothyroidism   . IBS (irritable bowel syndrome)   . Moderate episode of recurrent major depressive disorder (HCC) 06/03/2004  . Morbid obesity (HCC)   . MRSA (methicillin resistant Staphylococcus aureus) infection 11/2017   left inner thigh abcess  . Neurogenic bladder    has pacemaker  . Neuropathy   . Obesity   . Panic attacks   . Rheumatoid arthritis (HCC)   . Sleep apnea    STATES SEVERE, CANT TOLERATE MASK- LAST STUDY YEARS AGO    Medications:  No anticoagulation prior to admission per medication reconcilation  Assessment: 58 y/o F with history of anxiety, diabetes, gastric reflux, hypertension, CHF, obesity who presented to [facility] ED 12/18 with shortness of breath. Previous COVID diagnosis for which she was admitted to [facility] 12/8-12/12. D dimer elevated to 1276.  On 12/23, patient bled extensively through left arm IV site, and therapeutic heparin was placed on hold.   Pharmacy has been consulted to initiate heparin drip for hypercoagulable state secondary to COVID disease.  Goal of Therapy:  Heparin level 0.3-0.7 units/ml Monitor platelets by anticoagulation protocol: Yes   Plan:  Discussed with MD during  rounds, and will restart therapeutic anticoagulation. Will resume heparin at 1500 units/hour with no bolus due to recent bleed. Pharmacy will check 6-hour heparin level at 2000, and follow CBC with AM labs.   12/24 :  HL @ 1951 = 0.23 12/25 @ 0521 HL = 0.42, therapeutic x 1 - will continue infusion at 1700 units/hr Will recheck HL in 6 hrs to confirm.    Ena Dawley, PharmD 04/13/2019 6:42 AM

## 2019-04-13 NOTE — Consult Note (Signed)
ANTICOAGULATION CONSULT NOTE  Pharmacy Consult for Heparin Infusion Indication: Hypercoagable state 2/2 COVID disease  Allergies  Allergen Reactions  . Cephalexin Hives  . Codeine Palpitations, Nausea Only, Nausea And Vomiting, Rash and Shortness Of Breath    "makes heart fly, she gets flushed and passes out"  . Doxycycline Rash  . Propoxyphene Rash and Shortness Of Breath    Increase heart rate  . Sulfa Antibiotics Palpitations, Nausea Only, Shortness Of Breath and Hives    "makes heart fly, she gets flushed and passes out"  . Lovenox [Enoxaparin Sodium] Hives  . Hydrocodone Nausea And Vomiting    Hear racing & breaks out into a cold sweat.  . Meropenem Rash    Erythematous, hot, pruritic rash over arms, chest, back, abdomen, and face occurred at the end of meropenem infusion on 02/22/18   Patient Measurements: Height: 5\' 9"  (175.3 cm) Weight: (!) 320 lb (145.2 kg) IBW/kg (Calculated) : 66.2 Heparin Dosing Weight: 95.4  Vital Signs: Temp: 98.4 F (36.9 C) (12/25 0800) Temp Source: Axillary (12/25 0800) BP: 109/70 (12/25 0800) Pulse Rate: 65 (12/25 0900)  Labs: Recent Labs    04/11/19 0326 04/11/19 0712 04/12/19 0434 04/12/19 1951 04/13/19 0521 04/13/19 1059  HGB  --  10.2* 10.0*  --   --   --   HCT  --  31.3* 30.0*  --   --   --   PLT  --  275 297  --   --   --   HEPARINUNFRC 0.23*  --   --  0.23* 0.42 0.50  CREATININE 1.05*  --  1.00  --  0.91  --     Estimated Creatinine Clearance: 105.3 mL/min (by C-G formula based on SCr of 0.91 mg/dL).   Medical History: Past Medical History:  Diagnosis Date  . Abdominal wall hernia 01/29/2013  . Anxiety   . Arthritis    Rheumatoid  . C. difficile colitis   . Chronic diastolic heart failure (HCC)   . Depression   . Diabetes mellitus    states no meds or diet restrictions  at present  . Diastolic CHF (HCC)   . Esophagitis   . Fluid retention   . GERD (gastroesophageal reflux disease)   . Hiatal hernia   .  Hypertension   . Hypokalemia due to loss of potassium 10/21/2015   Overview:  Associated with 3 weeks of diarrhea  And QT prolongation.  . Hypothyroidism   . IBS (irritable bowel syndrome)   . Moderate episode of recurrent major depressive disorder (HCC) 06/03/2004  . Morbid obesity (HCC)   . MRSA (methicillin resistant Staphylococcus aureus) infection 11/2017   left inner thigh abcess  . Neurogenic bladder    has pacemaker  . Neuropathy   . Obesity   . Panic attacks   . Rheumatoid arthritis (HCC)   . Sleep apnea    STATES SEVERE, CANT TOLERATE MASK- LAST STUDY YEARS AGO    Medications:  No anticoagulation prior to admission per medication reconcilation  Assessment: 58 y/o F with history of anxiety, diabetes, gastric reflux, hypertension, CHF, obesity who presented to [facility] ED 12/18 with shortness of breath. Previous COVID diagnosis for which she was admitted to [facility] 12/8-12/12. D dimer elevated to 1276.  On 12/23, patient bled extensively through left arm IV site, and therapeutic heparin was placed on hold. No PE noted on 12/18 Chest CT.    Pharmacy has been consulted to initiate heparin drip for hypercoagulable state secondary to COVID disease.  Goal of Therapy:  Heparin level 0.3-0.7 units/ml Monitor platelets by anticoagulation protocol: Yes   Plan:  Heparin level therapeutic x 2. Will continue current rate of 1700 units/hr. Will f/u with HL and CBC with AM labs.  CBC stable.   Oswald Hillock, PharmD, BCPS 04/13/2019 11:39 AM

## 2019-04-13 NOTE — Progress Notes (Signed)
CRITICAL CARE NOTE  [name] [name] an 58 y.o.femaleformer smoker with a history of diabetes mellitus, morbid obesity and rheumatoid arthritis diagnosed with COVID-19 on December 4. Admitted to [facility] from 8 December through 12 December received 5 days of remdesivir. Also received dexamethasone. She has been found to be on the late pulmonary phase of COVID-19 infection marked by intense inflammatory response. Severe hypoxia and ARDS   CC  follow up respiratory failure  SUBJECTIVE Patient remains critically ill Prognosis is guarded critically ill   BP 103/78   Pulse 77   Temp 98.1 F (36.7 C) (Axillary)   Resp (!) 21   Ht 5\' 9"  (1.753 m)   Wt (!) 145.2 kg   LMP 04/20/2001   SpO2 (!) 81%   BMI 47.26 kg/m    I/O last 3 completed shifts: In: 236.1 [I.V.:60; IV Piggyback:176.1] Out: 3325 [Urine:3325] No intake/output data recorded.  SpO2: (!) 81 % O2 Flow Rate (L/min): 40 L/min FiO2 (%): 99 %   SIGNIFICANT EVENTS 12/4: Diagnosed with COVID-19 12/8-12:Admitted to [facility] for management of COVID-19, received remdesivir 12/18: Presented with increasing shortness of breath and hypoxemia quiring high flow O2, bilateral pulmonary infiltrates consistent with late, pulmonary phase of COVID-19 disease. Initiated COVID-19 management protocol for late pulmonary phase. 12/20: Was able to tolerate some proning last night. Oxygenation remains marginal, requiring high flow 12/22severe hypoxia resp failure on high flow [place] 12/23 severe hypoxia, patient refused intubation, will need discussion regarding CODE status 12/24 remains very hypoxic 12/25 remains on high flow, severe hypoxia   REVIEW OF SYSTEMS SEVERE SOB All other ROS  COVID-19 DISASTER DECLARATION:   FULL CONTACT PHYSICAL EXAMINATION WAS NOT POSSIBLE DUE TO TREATMENT OF COVID-19 AND   CONSERVATION OF PERSONAL PROTECTIVE EQUIPMENT, LIMITED EXAM FINDINGS INCLUDE-   Patient assessed or  the symptoms described in the history of present illness.   In the context of the Global COVID-19 pandemic, which necessitated consideration that the patient might be at risk for infection with the SARS-CoV-2 virus that causes COVID-19, Institutional protocols and algorithms that pertain to the evaluation of patients at risk for COVID-19 are in a state of rapid change based on information released by regulatory bodies including the CDC and federal and state organizations. These policies and algorithms were followed during the patient's care while in hospital.      CULTURE RESULTS   Recent Results (from the past 240 hour(s))  MRSA PCR Screening     Status: Abnormal   Collection Time: 04/06/19  9:48 AM   Specimen: Nasopharyngeal  Result Value Ref Range Status   MRSA by PCR POSITIVE (A) NEGATIVE Final    Comment:        The GeneXpert MRSA Assay (FDA approved for NASAL specimens only), is one component of a comprehensive MRSA colonization surveillance program. It is not intended to diagnose MRSA infection nor to guide or monitor treatment for MRSA infections. RESULT CALLED TO, READ BACK BY AND VERIFIED WITH: JAMIE DEEM @ 1645 ON 04/07/2019 BY CAF Performed at [facility], [address] [place], [place]   Blood Culture (routine x 2)     Status: Abnormal   Collection Time: 04/06/19 10:10 AM   Specimen: BLOOD  Result Value Ref Range Status   Specimen Description   Final    BLOOD RAC Performed at [facility], [address], [place], [place]    Special Requests   Final    BOTTLES DRAWN  AEROBIC AND ANAEROBIC Blood Culture adequate volume Performed at [facility], [address] [place], [place] [address]    Culture  Setup Time   Final    GRAM POSITIVE COCCI IN CLUSTERS AEROBIC BOTTLE ONLY CRITICAL RESULT CALLED TO, READ BACK BY AND VERIFIED WITH: ALEX CHAPPELL @0950  ON 04/07/2019 BY FMW Performed at [facility],  [address] [place], [place] [address]    Culture (A)  Final    STAPHYLOCOCCUS SPECIES (COAGULASE NEGATIVE) THE SIGNIFICANCE OF ISOLATING THIS ORGANISM FROM A SINGLE SET OF BLOOD CULTURES WHEN MULTIPLE SETS ARE DRAWN IS UNCERTAIN. PLEASE NOTIFY THE MICROBIOLOGY DEPARTMENT WITHIN ONE WEEK IF SPECIATION AND SENSITIVITIES ARE REQUIRED. Performed at [facility], [place] [address] [place], [place] [address]    Report Status 04/09/2019 FINAL  Final  Blood Culture (routine x 2)     Status: None   Collection Time: 04/06/19 10:55 AM   Specimen: BLOOD  Result Value Ref Range Status   Specimen Description BLOOD RAC  Final   Special Requests   Final    BOTTLES DRAWN AEROBIC AND ANAEROBIC Blood Culture results may not be optimal due to an excessive volume of blood received in culture bottles   Culture   Final    NO GROWTH 5 DAYS Performed at [facility], [address] [place], [place] [address]    Report Status 04/11/2019 FINAL  Final  Urine culture     Status: Abnormal   Collection Time: 04/06/19  9:18 PM   Specimen: In/Out Cath Urine  Result Value Ref Range Status   Specimen Description   Final    IN/OUT CATH URINE Performed at [facility], [address], [place], [place] [address]    Special Requests   Final    NONE Performed at [facility], [address] [place], [place] [address]    Culture >=100,000 COLONIES/mL YEAST (A)  Final   Report Status 04/08/2019 FINAL  Final  CULTURE, BLOOD (ROUTINE X 2) w Reflex to ID Panel     Status: Abnormal (Preliminary result)   Collection Time: 04/10/19  2:37 AM   Specimen: BLOOD  Result Value Ref Range Status   Specimen Description   Final    BLOOD RIGHT ANTECUBITAL Performed at [facility], [address] [place], [place] [address]    Special Requests   Final    BOTTLES DRAWN AEROBIC AND ANAEROBIC Blood Culture results may not be optimal due to an excessive volume of blood received in  culture bottles Performed at [facility], [address] [place], [place] [address]    Culture  Setup Time   Final    GRAM POSITIVE COCCI ANAEROBIC BOTTLE ONLY CRITICAL RESULT CALLED TO, READ BACK BY AND VERIFIED WITH: Hart Robinsons [id number] 04/11/2019 HNM Performed at [facility], [address], [place], [place] [address]    Culture (A)  Final    STAPHYLOCOCCUS EPIDERMIDIS SUSCEPTIBILITIES TO FOLLOW Performed at [facility], [place] [address] [place], [place] [address]    Report Status PENDING  Incomplete  CULTURE, BLOOD (ROUTINE X 2) w Reflex to ID Panel     Status: None (Preliminary result)   Collection Time: 04/10/19  2:42 AM   Specimen: BLOOD  Result Value Ref Range Status   Specimen Description BLOOD BLOOD RIGHT FOREARM  Final   Special Requests   Final    BOTTLES DRAWN AEROBIC AND ANAEROBIC Blood Culture adequate volume  Culture   Final    NO GROWTH 3 DAYS Performed at [facility], [address] [place], [place] [address]    Report Status PENDING  Incomplete            ASSESSMENT AND PLAN SYNOPSIS  SEVERE HYPOXIC RESP FAILURE FROM COVID 19 INFECTION with ARDS HIGH RISK FOR INTUBATION HIGH RISK FOR CARDIAC ARREST   SEVERE ACUTE HYPOXIC RESP FAILURE AND HYPERCAPNIC FAILURE WITH ARDS WITH PROGRESSIVE COVID 19 INFECTION AND PNEUMONIA AGGRESSIVE DIURESIS AS TOLERATED   CARDIAC ICU monitoring    GI GI PROPHYLAXIS as indicated  NUTRITIONAL STATUS DIET-->as tolerated Constipation protocol as indicated  ENDO - will use ICU hypoglycemic\Hyperglycemia protocol if indicated   ELECTROLYTES -follow labs as needed -replace as needed -pharmacy consultation and following   DVT/GI PRX ordered TRANSFUSIONS AS NEEDED MONITOR FSBS ASSESS the need for LABS as needed   Critical Care Time devoted to patient care services described in this note is 32 minutes.   Overall, patient is critically ill, prognosis is  guarded.     Lucie Leather, M.D.  [facility]  Medical Director [facility] Medical Director [facility] Cardio-Pulmonary Department

## 2019-04-14 LAB — CBC
HCT: 33 % — ABNORMAL LOW (ref 36.0–46.0)
Hemoglobin: 10.7 g/dL — ABNORMAL LOW (ref 12.0–15.0)
MCH: 25.4 pg — ABNORMAL LOW (ref 26.0–34.0)
MCHC: 32.4 g/dL (ref 30.0–36.0)
MCV: 78.4 fL — ABNORMAL LOW (ref 80.0–100.0)
Platelets: 273 10*3/uL (ref 150–400)
RBC: 4.21 MIL/uL (ref 3.87–5.11)
RDW: 15.5 % (ref 11.5–15.5)
WBC: 8.2 10*3/uL (ref 4.0–10.5)
nRBC: 0 % (ref 0.0–0.2)

## 2019-04-14 LAB — GLUCOSE, CAPILLARY
Glucose-Capillary: 135 mg/dL — ABNORMAL HIGH (ref 70–99)
Glucose-Capillary: 116 mg/dL — ABNORMAL HIGH (ref 70–99)
Glucose-Capillary: 194 mg/dL — ABNORMAL HIGH (ref 70–99)
Glucose-Capillary: 169 mg/dL — ABNORMAL HIGH (ref 70–99)

## 2019-04-14 LAB — BASIC METABOLIC PANEL
Anion gap: 8 (ref 5–15)
BUN: 33 mg/dL — ABNORMAL HIGH (ref 6–20)
CO2: 25 mmol/L (ref 22–32)
Calcium: 8.8 mg/dL — ABNORMAL LOW (ref 8.9–10.3)
Chloride: 103 mmol/L (ref 98–111)
Creatinine, Ser: 0.9 mg/dL (ref 0.44–1.00)
GFR calc Af Amer: >60 mL/min (ref >60)
GFR calc non Af Amer: >60 mL/min (ref >60)
Glucose, Bld: 143 mg/dL — ABNORMAL HIGH (ref 70–99)
Potassium: 3.9 mmol/L (ref 3.5–5.1)
Sodium: 136 mmol/L (ref 135–145)

## 2019-04-14 LAB — HEPARIN LEVEL (UNFRACTIONATED): Heparin Unfractionated: 0.65 IU/mL (ref 0.30–0.70)

## 2019-04-14 NOTE — Consult Note (Signed)
ANTICOAGULATION CONSULT NOTE  Pharmacy Consult for Heparin Infusion Indication: Hypercoagable state 2/2 COVID disease  Allergies  Allergen Reactions  . Cephalexin Hives  . Codeine Palpitations, Nausea Only, Nausea And Vomiting, Rash and Shortness Of Breath    "makes heart fly, she gets flushed and passes out"  . Doxycycline Rash  . Propoxyphene Rash and Shortness Of Breath    Increase heart rate  . Sulfa Antibiotics Palpitations, Nausea Only, Shortness Of Breath and Hives    "makes heart fly, she gets flushed and passes out"  . Lovenox [Enoxaparin Sodium] Hives  . Hydrocodone Nausea And Vomiting    Hear racing & breaks out into a cold sweat.  . Meropenem Rash    Erythematous, hot, pruritic rash over arms, chest, back, abdomen, and face occurred at the end of meropenem infusion on 02/22/18   Patient Measurements: Height: 5\' 9"  (175.3 cm) Weight: (!) 320 lb (145.2 kg) IBW/kg (Calculated) : 66.2 Heparin Dosing Weight: 95.4  Vital Signs: Temp: 98.8 F (37.1 C) (12/26 0054) Temp Source: Axillary (12/26 0100) BP: 91/58 (12/26 0700) Pulse Rate: 70 (12/26 0700)  Labs: Recent Labs    04/12/19 0434 04/13/19 0521 04/13/19 1059 04/14/19 0629 04/14/19 0630  HGB 10.0*  --   --   --  10.7*  HCT 30.0*  --   --   --  33.0*  PLT 297  --   --   --  273  HEPARINUNFRC  --  0.42 0.50 0.65  --   CREATININE 1.00 0.91  --  0.90  --     Estimated Creatinine Clearance: 106.5 mL/min (by C-G formula based on SCr of 0.9 mg/dL).   Medical History: Past Medical History:  Diagnosis Date  . Abdominal wall hernia 01/29/2013  . Anxiety   . Arthritis    Rheumatoid  . C. difficile colitis   . Chronic diastolic heart failure (HCC)   . Depression   . Diabetes mellitus    states no meds or diet restrictions  at present  . Diastolic CHF (HCC)   . Esophagitis   . Fluid retention   . GERD (gastroesophageal reflux disease)   . Hiatal hernia   . Hypertension   . Hypokalemia due to loss of  potassium 10/21/2015   Overview:  Associated with 3 weeks of diarrhea  And QT prolongation.  . Hypothyroidism   . IBS (irritable bowel syndrome)   . Moderate episode of recurrent major depressive disorder (HCC) 06/03/2004  . Morbid obesity (HCC)   . MRSA (methicillin resistant Staphylococcus aureus) infection 11/2017   left inner thigh abcess  . Neurogenic bladder    has pacemaker  . Neuropathy   . Obesity   . Panic attacks   . Rheumatoid arthritis (HCC)   . Sleep apnea    STATES SEVERE, CANT TOLERATE MASK- LAST STUDY YEARS AGO    Medications:  No anticoagulation prior to admission per medication reconcilation  Assessment: 58 y/o F with history of anxiety, diabetes, gastric reflux, hypertension, CHF, obesity who presented to [facility] ED 12/18 with shortness of breath. Previous COVID diagnosis for which she was admitted to [facility] 12/8-12/12. D dimer elevated to 1276.  On 12/23, patient bled extensively through left arm IV site, and therapeutic heparin was placed on hold. No PE noted on 12/18 Chest CT.    Pharmacy has been consulted to initiate heparin drip for hypercoagulable state secondary to COVID disease.  Goal of Therapy:  Heparin level 0.3-0.7 units/ml Monitor platelets by anticoagulation protocol: Yes  Plan:  12/26 0629 HL= 0.65, Heparin level therapeuticWill continue current rate of 1700 units/hr. Will f/u with HL and CBC with AM labs.  CBC stable.   Ezra Marquess A, PharmD, BCPS 04/14/2019 8:24 AM

## 2019-04-15 ENCOUNTER — Other Ambulatory Visit: Payer: Self-pay

## 2019-04-15 LAB — GLUCOSE, CAPILLARY
Glucose-Capillary: 140 mg/dL — ABNORMAL HIGH (ref 70–99)
Glucose-Capillary: 128 mg/dL — ABNORMAL HIGH (ref 70–99)
Glucose-Capillary: 229 mg/dL — ABNORMAL HIGH (ref 70–99)
Glucose-Capillary: 185 mg/dL — ABNORMAL HIGH (ref 70–99)

## 2019-04-15 LAB — BASIC METABOLIC PANEL
Anion gap: 12 (ref 5–15)
BUN: 33 mg/dL — ABNORMAL HIGH (ref 6–20)
CO2: 23 mmol/L (ref 22–32)
Calcium: 9.1 mg/dL (ref 8.9–10.3)
Chloride: 102 mmol/L (ref 98–111)
Creatinine, Ser: 0.91 mg/dL (ref 0.44–1.00)
GFR calc Af Amer: >60 mL/min (ref >60)
GFR calc non Af Amer: >60 mL/min (ref >60)
Glucose, Bld: 200 mg/dL — ABNORMAL HIGH (ref 70–99)
Potassium: 4.2 mmol/L (ref 3.5–5.1)
Sodium: 137 mmol/L (ref 135–145)

## 2019-04-15 LAB — CBC
HCT: 35.4 % — ABNORMAL LOW (ref 36.0–46.0)
Hemoglobin: 11.7 g/dL — ABNORMAL LOW (ref 12.0–15.0)
MCH: 25.9 pg — ABNORMAL LOW (ref 26.0–34.0)
MCHC: 33.1 g/dL (ref 30.0–36.0)
MCV: 78.3 fL — ABNORMAL LOW (ref 80.0–100.0)
Platelets: 322 10*3/uL (ref 150–400)
RBC: 4.52 MIL/uL (ref 3.87–5.11)
RDW: 15.9 % — ABNORMAL HIGH (ref 11.5–15.5)
WBC: 13 10*3/uL — ABNORMAL HIGH (ref 4.0–10.5)
nRBC: 0 % (ref 0.0–0.2)

## 2019-04-15 LAB — CULTURE, BLOOD (ROUTINE X 2)
Culture: NO GROWTH
Special Requests: ADEQUATE

## 2019-04-15 LAB — HEPARIN LEVEL (UNFRACTIONATED): Heparin Unfractionated: 0.68 IU/mL (ref 0.30–0.70)

## 2019-04-15 MED ORDER — PNEUMOCOCCAL VAC POLYVALENT 25 MCG/0.5ML IJ INJ
0.5000 mL | INJECTION | INTRAMUSCULAR | Status: AC
  Administered 2019-04-16: 0.5 mL via INTRAMUSCULAR
  Filled 2019-04-15: qty 0.5

## 2019-04-15 MED ORDER — ALPRAZOLAM 0.5 MG PO TABS
0.5000 mg | ORAL_TABLET | Freq: Three times a day (TID) | ORAL | Status: DC | PRN
  Administered 2019-04-15 – 2019-04-17 (×4): 0.5 mg via ORAL
  Filled 2019-04-15 (×5): qty 1

## 2019-04-15 NOTE — Progress Notes (Signed)
Pt has been tearful throughout the shift today. Pt expresses her fears about having COVID and about having been in the Hospital for so long. Pt updated on her status by Dr. Mortimer Fries at the bedside. Family also updated by Dr. Mortimer Fries Via phone. Emotional support provided to pt  And pt family. Pt was ordered med for anxiety by MD per patient request. See MAR.  Will continue to monitor.

## 2019-04-15 NOTE — Consult Note (Signed)
ANTICOAGULATION CONSULT NOTE  Pharmacy Consult for Heparin Infusion Indication: Hypercoagable state 2/2 COVID disease  Allergies  Allergen Reactions  . Cephalexin Hives  . Codeine Palpitations, Nausea Only, Nausea And Vomiting, Rash and Shortness Of Breath    "makes heart fly, she gets flushed and passes out"  . Doxycycline Rash  . Propoxyphene Rash and Shortness Of Breath    Increase heart rate  . Sulfa Antibiotics Palpitations, Nausea Only, Shortness Of Breath and Hives    "makes heart fly, she gets flushed and passes out"  . Lovenox [Enoxaparin Sodium] Hives  . Hydrocodone Nausea And Vomiting    Hear racing & breaks out into a cold sweat.  . Meropenem Rash    Erythematous, hot, pruritic rash over arms, chest, back, abdomen, and face occurred at the end of meropenem infusion on 02/22/18   Patient Measurements: Height: 5\' 9"  (175.3 cm) Weight: (!) 320 lb (145.2 kg) IBW/kg (Calculated) : 66.2 Heparin Dosing Weight: 95.4  Vital Signs: Temp: 98.6 F (37 C) (12/27 0400) Temp Source: Oral (12/27 0400) BP: 103/89 (12/27 0400) Pulse Rate: 76 (12/27 0400)  Labs: Recent Labs    04/13/19 0521 04/13/19 1059 04/14/19 0629 04/14/19 0630 04/15/19 0421  HGB  --   --   --  10.7* 11.7*  HCT  --   --   --  33.0* 35.4*  PLT  --   --   --  273 322  HEPARINUNFRC 0.42 0.50 0.65  --  0.68  CREATININE 0.91  --  0.90  --  0.91    Estimated Creatinine Clearance: 105.3 mL/min (by C-G formula based on SCr of 0.91 mg/dL).   Medical History: Past Medical History:  Diagnosis Date  . Abdominal wall hernia 01/29/2013  . Anxiety   . Arthritis    Rheumatoid  . C. difficile colitis   . Chronic diastolic heart failure ([place])   . Depression   . Diabetes mellitus    states no meds or diet restrictions  at present  . Diastolic CHF ([place])   . Esophagitis   . Fluid retention   . GERD (gastroesophageal reflux disease)   . Hiatal hernia   . Hypertension   . Hypokalemia due to loss of  potassium 10/21/2015   Overview:  Associated with 3 weeks of diarrhea  And QT prolongation.  . Hypothyroidism   . IBS (irritable bowel syndrome)   . Moderate episode of recurrent major depressive disorder ([place]) 06/03/2004  . Morbid obesity ([place])   . MRSA (methicillin resistant Staphylococcus aureus) infection 11/2017   left inner thigh abcess  . Neurogenic bladder    has pacemaker  . Neuropathy   . Obesity   . Panic attacks   . Rheumatoid arthritis ([place])   . Sleep apnea    STATES SEVERE, CANT TOLERATE MASK- LAST STUDY YEARS AGO    Medications:  No anticoagulation prior to admission per medication reconcilation  Assessment: 58 y/o F with history of anxiety, diabetes, gastric reflux, hypertension, CHF, obesity who presented to [facility] ED 12/18 with shortness of breath. Previous COVID diagnosis for which she was admitted to [facility] 12/8-12/12. D dimer elevated to 1276.  On 12/23, patient bled extensively through left arm IV site, and therapeutic heparin was placed on hold. No PE noted on 12/18 Chest CT.    Pharmacy has been consulted to initiate heparin drip for hypercoagulable state secondary to COVID disease.  Goal of Therapy:  Heparin level 0.3-0.7 units/ml Monitor platelets by anticoagulation protocol: Yes  Plan:  12/26 0629 HL= 0.65, Heparin level therapeutic.  Will continue current rate of 1700 units/hr. Will f/u with HL and CBC with AM labs.  CBC stable.  12/27 @ 0421 HL = 0.68, therapeutic, CBC stable.  Continue current rate.  HL and CBC tomorrow am.  Wayland Denis, PharmD 04/15/2019 5:59 AM

## 2019-04-16 LAB — CBC
HCT: 37 % (ref 36.0–46.0)
Hemoglobin: 11.6 g/dL — ABNORMAL LOW (ref 12.0–15.0)
MCH: 25.7 pg — ABNORMAL LOW (ref 26.0–34.0)
MCHC: 31.4 g/dL (ref 30.0–36.0)
MCV: 82 fL (ref 80.0–100.0)
Platelets: 284 10*3/uL (ref 150–400)
RBC: 4.51 MIL/uL (ref 3.87–5.11)
RDW: 15.9 % — ABNORMAL HIGH (ref 11.5–15.5)
WBC: 13.4 10*3/uL — ABNORMAL HIGH (ref 4.0–10.5)
nRBC: 0 % (ref 0.0–0.2)

## 2019-04-16 LAB — BASIC METABOLIC PANEL
Anion gap: 11 (ref 5–15)
BUN: 39 mg/dL — ABNORMAL HIGH (ref 6–20)
CO2: 23 mmol/L (ref 22–32)
Calcium: 8.7 mg/dL — ABNORMAL LOW (ref 8.9–10.3)
Chloride: 101 mmol/L (ref 98–111)
Creatinine, Ser: 1.08 mg/dL — ABNORMAL HIGH (ref 0.44–1.00)
GFR calc Af Amer: >60 mL/min (ref >60)
GFR calc non Af Amer: 57 mL/min — ABNORMAL LOW (ref >60)
Glucose, Bld: 300 mg/dL — ABNORMAL HIGH (ref 70–99)
Potassium: 4.5 mmol/L (ref 3.5–5.1)
Sodium: 135 mmol/L (ref 135–145)

## 2019-04-16 LAB — GLUCOSE, CAPILLARY
Glucose-Capillary: 232 mg/dL — ABNORMAL HIGH (ref 70–99)
Glucose-Capillary: 250 mg/dL — ABNORMAL HIGH (ref 70–99)
Glucose-Capillary: 288 mg/dL — ABNORMAL HIGH (ref 70–99)
Glucose-Capillary: 240 mg/dL — ABNORMAL HIGH (ref 70–99)

## 2019-04-16 LAB — HEPARIN LEVEL (UNFRACTIONATED): Heparin Unfractionated: 0.59 IU/mL (ref 0.30–0.70)

## 2019-04-16 MED ORDER — INSULIN ASPART 100 UNIT/ML ~~LOC~~ SOLN
0.0000 [IU] | SUBCUTANEOUS | Status: DC
  Administered 2019-04-16 (×2): 11 [IU] via SUBCUTANEOUS
  Administered 2019-04-16: 7 [IU] via SUBCUTANEOUS
  Administered 2019-04-17: 3 [IU] via SUBCUTANEOUS
  Administered 2019-04-17: 7 [IU] via SUBCUTANEOUS
  Administered 2019-04-17 (×3): 4 [IU] via SUBCUTANEOUS
  Administered 2019-04-18: 20 [IU] via SUBCUTANEOUS
  Administered 2019-04-18: 4 [IU] via SUBCUTANEOUS
  Administered 2019-04-18: 15 [IU] via SUBCUTANEOUS
  Administered 2019-04-18: 4 [IU] via SUBCUTANEOUS
  Administered 2019-04-18: 7 [IU] via SUBCUTANEOUS
  Administered 2019-04-19: 4 [IU] via SUBCUTANEOUS
  Administered 2019-04-19: 20 [IU] via SUBCUTANEOUS
  Administered 2019-04-19: 4 [IU] via SUBCUTANEOUS
  Administered 2019-04-19: 3 [IU] via SUBCUTANEOUS
  Administered 2019-04-19: 20 [IU] via SUBCUTANEOUS
  Administered 2019-04-19: 7 [IU] via SUBCUTANEOUS
  Administered 2019-04-20: 15 [IU] via SUBCUTANEOUS
  Administered 2019-04-20: 7 [IU] via SUBCUTANEOUS
  Administered 2019-04-20: 11 [IU] via SUBCUTANEOUS
  Administered 2019-04-20 (×2): 7 [IU] via SUBCUTANEOUS
  Filled 2019-04-16 (×20): qty 1

## 2019-04-16 MED ORDER — FUROSEMIDE 10 MG/ML IJ SOLN
20.0000 mg | Freq: Two times a day (BID) | INTRAMUSCULAR | Status: DC
  Administered 2019-04-16 – 2019-04-17 (×4): 20 mg via INTRAVENOUS
  Filled 2019-04-16 (×4): qty 2

## 2019-04-16 NOTE — Progress Notes (Signed)
MEDICATION RELATED CONSULT NOTE - FOLLOW UP   Pharmacy Consult for Insulin regimen   Allergies  Allergen Reactions  . Cephalexin Hives  . Codeine Palpitations, Nausea Only, Nausea And Vomiting, Rash and Shortness Of Breath    "makes heart fly, she gets flushed and passes out"  . Doxycycline Rash  . Propoxyphene Rash and Shortness Of Breath    Increase heart rate  . Sulfa Antibiotics Palpitations, Nausea Only, Shortness Of Breath and Hives    "makes heart fly, she gets flushed and passes out"  . Lovenox [Enoxaparin Sodium] Hives  . Hydrocodone Nausea And Vomiting    Hear racing & breaks out into a cold sweat.  . Meropenem Rash    Erythematous, hot, pruritic rash over arms, chest, back, abdomen, and face occurred at the end of meropenem infusion on 02/22/18    Patient Measurements: Height: 5\' 9"  (175.3 cm) Weight: (!) 320 lb (145.2 kg) IBW/kg (Calculated) : 66.2   Vital Signs: Temp: 98.7 F (37.1 C) (12/28 1600) Temp Source: Axillary (12/28 1600) BP: 84/59 (12/28 1900) Pulse Rate: 83 (12/28 1900) Intake/Output from previous day: 12/27 0701 - 12/28 0700 In: 398.3 [I.V.:398.3] Out: 1650 [Urine:1650] Intake/Output from this shift: No intake/output data recorded.   Assessment: Lantus 65 units daily Novolog 6 units TID with meals SSI 0-20 linagliptin  Methylpred 40 mg IV q12h since 12/22    Plan:  SSI changed from with meals to q4h today Will follow up in AM to assess trend  Rayna Sexton L 04/16/2019,7:46 PM

## 2019-04-16 NOTE — Progress Notes (Signed)
CRITICAL CARE PROGRESS NOTE    Name: [name] [name] MRN: [id number] DOB: Nov 08, 1960     LOS: 10   SUBJECTIVE FINDINGS & SIGNIFICANT EVENTS   Patient description:  [name] DikeDorothy E Deeseis an 58 y.o.femaleformer smoker with a history of diabetes mellitus, morbid obesity and rheumatoid arthritis diagnosed with COVID-19 on December 4. Admitted to [facility] from 8 December through 12 December received 5 days of remdesivir. Also received dexamethasone. She has been found with ARDS   Lines / Drains: PIVx2  Cultures / Sepsis markers: Blood culture X1 positive for staph epi with resistance - possibly contaminant  Antibiotics: None    Protocols / Consultants: Hospitalist/pccm   Tests / Events: None   Overnight: Weaned down to  75%FiO2, mentation improving.    PAST MEDICAL HISTORY   Past Medical History:  Diagnosis Date  . Abdominal wall hernia 01/29/2013  . Anxiety   . Arthritis    Rheumatoid  . C. difficile colitis   . Chronic diastolic heart failure (HCC)   . Depression   . Diabetes mellitus    states no meds or diet restrictions  at present  . Diastolic CHF (HCC)   . Esophagitis   . Fluid retention   . GERD (gastroesophageal reflux disease)   . Hiatal hernia   . Hypertension   . Hypokalemia due to loss of potassium 10/21/2015   Overview:  Associated with 3 weeks of diarrhea  And QT prolongation.  . Hypothyroidism   . IBS (irritable bowel syndrome)   . Moderate episode of recurrent major depressive disorder (HCC) 06/03/2004  . Morbid obesity (HCC)   . MRSA (methicillin resistant Staphylococcus aureus) infection 11/2017   left inner thigh abcess  . Neurogenic bladder    has pacemaker  . Neuropathy   . Obesity   . Panic attacks   . Rheumatoid arthritis (HCC)   . Sleep apnea    STATES SEVERE, CANT TOLERATE MASK- LAST STUDY YEARS AGO     SURGICAL HISTORY   Past Surgical History:  Procedure Laterality Date  . ABDOMINAL HYSTERECTOMY    . CHOLECYSTECTOMY    . DG GREAT TOE RIGHT FOOT  02/23/2018  . EYE SURGERY     bilateral cataract extraction with IOL  . HERNIA REPAIR     ventral hernia with strangulation  . LAPAROSCOPIC GASTRIC BANDING  03/20/07  . TONSILLECTOMY    . TUBAL LIGATION       FAMILY HISTORY   Family History  Problem Relation Age of Onset  . Heart failure Father   . Bipolar disorder Father   . Alcohol abuse Father   . Anxiety disorder Father   . Depression Father   . Heart disease Brother   . Heart attack Brother [id number]       MI s/p stents placed  . Anxiety disorder Sister   . Depression Sister   . Anxiety disorder Sister   . Depression Sister   . Bipolar disorder Sister   . Alcohol abuse Sister   . Drug abuse Sister   . Heart attack Brother      SOCIAL HISTORY   Social History   Tobacco Use  . Smoking status: Former Smoker    Packs/day: 2.00    Years: 27.00    Pack years: 54.00    Types: Cigarettes    Quit date: 07/30/1999    Years since quitting: 19.7  . Smokeless tobacco: Never Used  Substance Use Topics  . Alcohol use: No  .  Drug use: No     MEDICATIONS   Current Medication:  Current Facility-Administered Medications:  .  acetaminophen (TYLENOL) tablet 650 mg, 650 mg, Oral, Q6H PRN, Lorretta Harp, MD, 650 mg at 04/10/19 0224 .  albuterol (VENTOLIN HFA) 108 (90 Base) MCG/ACT inhaler 2 puff, 2 puff, Inhalation, Q4H PRN, Lorretta Harp, MD .  ALPRAZolam Prudy Feeler) tablet 0.5 mg, 0.5 mg, Oral, TID PRN, Erin Fulling, MD, 0.5 mg at 04/16/19 2820 .  ascorbic acid (VITAMIN C) tablet 500 mg, 500 mg, Oral, Daily, Lorretta Harp, MD, 500 mg at 04/15/19 1010 .  atorvastatin (LIPITOR) tablet 80 mg, 80 mg, Oral, q1800, Salena Saner, MD, 80 mg at 04/15/19 2143 .  busPIRone (BUSPAR) tablet 10 mg, 10 mg, Oral, BID, Lorretta Harp, MD, 10 mg  at 04/15/19 2144 .  calcium-vitamin D (OSCAL WITH D) 500-200 MG-UNIT per tablet 1 tablet, 1 tablet, Oral, Daily, Lorretta Harp, MD, 1 tablet at 04/16/19 [phone] .  Chlorhexidine Gluconate Cloth 2 % PADS 6 each, 6 each, Topical, Daily, Danford, Earl Lites, MD, 6 each at 04/15/19 1012 .  cholecalciferol (VITAMIN D3) tablet 1,000 Units, 1,000 Units, Oral, Q breakfast, Lorretta Harp, MD, 1,000 Units at 04/15/19 1011 .  ciclesonide (ALVESCO) 160 MCG/ACT inhaler 4 puff, 4 puff, Inhalation, BID, Salena Saner, MD, 4 puff at 04/15/19 2144 .  colchicine tablet 0.6 mg, 0.6 mg, Oral, Daily, Salena Saner, MD, 0.6 mg at 04/15/19 1007 .  darifenacin (ENABLEX) 24 hr tablet 7.5 mg, 7.5 mg, Oral, Daily, Lorretta Harp, MD, 7.5 mg at 04/15/19 1017 .  famotidine (PEPCID) tablet 40 mg, 40 mg, Oral, Daily, Salena Saner, MD, 40 mg at 04/15/19 1009 .  FLUoxetine (PROZAC) capsule 20 mg, 20 mg, Oral, Daily, Lorretta Harp, MD, 20 mg at 04/15/19 1009 .  folic acid (FOLVITE) tablet 1 mg, 1 mg, Oral, Daily, Lorretta Harp, MD, 1 mg at 04/15/19 1011 .  guaiFENesin (ROBITUSSIN) 100 MG/5ML solution 100 mg, 5 mL, Oral, Q4H PRN, Ogan, Okoronkwo U, MD, 100 mg at 04/15/19 1006 .  heparin ADULT infusion 100 units/mL (25000 units/22mL sodium chloride 0.45%), 1,700 Units/hr, Intravenous, Continuous, Kasa, Kurian, MD, Last Rate: 17 mL/hr at 04/16/19 0633, 1,700 Units/hr at 04/16/19 0633 .  insulin aspart (novoLOG) injection 0-20 Units, 0-20 Units, Subcutaneous, TID WC, Eugenie Norrie, NP, 7 Units at 04/16/19 [phone] .  insulin aspart (novoLOG) injection 0-5 Units, 0-5 Units, Subcutaneous, QHS, Eugenie Norrie, NP, 3 Units at 04/12/19 2101 .  insulin aspart (novoLOG) injection 6 Units, 6 Units, Subcutaneous, TID WC, Danford, Earl Lites, MD, 6 Units at 04/16/19 [phone] .  insulin glargine (LANTUS) injection 65 Units, 65 Units, Subcutaneous, Daily, Danford, Earl Lites, MD, 65 Units at 04/16/19 [phone] .  levothyroxine (SYNTHROID) tablet 88  mcg, 88 mcg, Oral, QAC breakfast, Lorretta Harp, MD, 88 mcg at 04/16/19 0836 .  linagliptin (TRADJENTA) tablet 5 mg, 5 mg, Oral, Daily, Danford, Earl Lites, MD, 5 mg at 04/15/19 1009 .  lip balm (BLISTEX) ointment, , Topical, PRN, Harlon Ditty D, NP .  magic mouthwash, 15 mL, Oral, TID PRN, Salena Saner, MD, 15 mL at 04/10/19 0534 .  MEDLINE mouth rinse, 15 mL, Mouth Rinse, BID, Eugenie Norrie, NP, 15 mL at 04/15/19 2145 .  methylPREDNISolone sodium succinate (SOLU-MEDROL) 40 mg/mL injection 40 mg, 40 mg, Intravenous, Q12H, Blakeney, Neldon Newport, NP, 40 mg at 04/15/19 2143 .  mupirocin ointment (BACTROBAN) 2 %, , Nasal, BID, Salena Saner, MD, Given at 04/15/19  2144 .  ondansetron (ZOFRAN) injection 4 mg, 4 mg, Intravenous, Q8H PRN, Ivor Costa, MD, 4 mg at 04/08/19 1104 .  oxyCODONE (Oxy IR/ROXICODONE) immediate release tablet 5 mg, 5 mg, Oral, Q6H PRN, Ivor Costa, MD, 5 mg at 04/12/19 2101 .  pneumococcal 23 valent vaccine (PNEUMOVAX-23) injection 0.5 mL, 0.5 mL, Intramuscular, Tomorrow-1000, Kasa, Kurian, MD .  polyethylene glycol (MIRALAX / GLYCOLAX) packet 17 g, 17 g, Oral, Daily PRN, Darel Hong D, NP .  pramipexole (MIRAPEX) tablet 0.125 mg, 0.125 mg, Oral, BID, Ivor Costa, MD, 0.125 mg at 04/15/19 2144 .  pregabalin (LYRICA) capsule 150 mg, 150 mg, Oral, TID, Ivor Costa, MD, 150 mg at 04/15/19 2143 .  sodium chloride flush (NS) 0.9 % injection 10-40 mL, 10-40 mL, Intracatheter, Q12H, Kasa, Kurian, MD, 10 mL at 04/15/19 2143 .  sodium chloride flush (NS) 0.9 % injection 10-40 mL, 10-40 mL, Intracatheter, PRN, Flora Lipps, MD .  zinc sulfate capsule 220 mg, 220 mg, Oral, Daily, Ivor Costa, MD, 220 mg at 04/15/19 1016 .  zolpidem (AMBIEN) tablet 5 mg, 5 mg, Oral, QHS, Ivor Costa, MD, 5 mg at 04/15/19 2143    ALLERGIES   Cephalexin, Codeine, Doxycycline, Propoxyphene, Sulfa antibiotics, Lovenox [enoxaparin sodium], Hydrocodone, and Meropenem    REVIEW OF SYSTEMS     Patient gives thumbs up for negatives and is negative except as per subjective findings  PHYSICAL EXAMINATION   Vital Signs: Temp:  [97.9 F (36.6 C)-98.8 F (37.1 C)] 98.1 F (36.7 C) (12/28 0800) Pulse Rate:  [71-102] 84 (12/28 0800) Resp:  [6-26] 25 (12/28 0800) BP: (96-135)/(56-96) 112/80 (12/28 0800) SpO2:  [70 %-94 %] 85 % (12/28 0800) FiO2 (%):  [80 %] 80 % (12/27 1600)  COVID-19 DISASTER DECLARATION:   FULL CONTACT PHYSICAL EXAMINATION WAS NOT POSSIBLE DUE TO TREATMENT OF COVID-19  AND CONSERVATION OF PERSONAL PROTECTIVE EQUIPMENT, LIMITED EXAM FINDINGS INCLUDE-  Physical Exam  Constitutional: She appears well-developed. No distress.  HENT:  Head: Normocephalic and atraumatic.  Eyes: EOM are normal. Left eye exhibits no discharge.  Neck: No tracheal deviation present.  Cardiovascular: Normal rate and regular rhythm.  Pulmonary/Chest: Effort normal. No respiratory distress.  Abdominal: There is no guarding.  Musculoskeletal:        General: No edema.     Cervical back: Normal range of motion.  Neurological: She is alert.  Skin: No rash noted. She is not diaphoretic. No erythema. There is pallor.     Patient assessed or the symptoms described in the history of present illness.  In the context of the Global COVID-19 pandemic, which necessitated consideration that the patient might be at risk for infection with the SARS-CoV-2 virus that causes COVID-19, Institutional protocols and algorithms that pertain to the evaluation of patients at risk for COVID-19 are in a state of rapid change based on information released by regulatory bodies including the CDC and federal and state organizations. These policies and algorithms were followed during the patient's care while in hospital.   PERTINENT DATA     Infusions: . heparin 1,700 Units/hr (04/16/19 1610)   Scheduled Medications: . vitamin C  500 mg Oral Daily  . atorvastatin  80 mg Oral q1800  . busPIRone  10 mg  Oral BID  . calcium-vitamin D  1 tablet Oral Daily  . Chlorhexidine Gluconate Cloth  6 each Topical Daily  . cholecalciferol  1,000 Units Oral Q breakfast  . ciclesonide  4 puff Inhalation BID  . colchicine  0.6 mg  Oral Daily  . darifenacin  7.5 mg Oral Daily  . famotidine  40 mg Oral Daily  . FLUoxetine  20 mg Oral Daily  . folic acid  1 mg Oral Daily  . insulin aspart  0-20 Units Subcutaneous TID WC  . insulin aspart  0-5 Units Subcutaneous QHS  . insulin aspart  6 Units Subcutaneous TID WC  . insulin glargine  65 Units Subcutaneous Daily  . levothyroxine  88 mcg Oral QAC breakfast  . linagliptin  5 mg Oral Daily  . mouth rinse  15 mL Mouth Rinse BID  . methylPREDNISolone (SOLU-MEDROL) injection  40 mg Intravenous Q12H  . mupirocin ointment   Nasal BID  . pneumococcal 23 valent vaccine  0.5 mL Intramuscular Tomorrow-1000  . pramipexole  0.125 mg Oral BID  . pregabalin  150 mg Oral TID  . sodium chloride flush  10-40 mL Intracatheter Q12H  . zinc sulfate  220 mg Oral Daily  . zolpidem  5 mg Oral QHS   PRN Medications: acetaminophen, albuterol, ALPRAZolam, guaiFENesin, lip balm, magic mouthwash, ondansetron (ZOFRAN) IV, oxyCODONE, polyethylene glycol, sodium chloride flush Hemodynamic parameters:   Intake/Output: 12/27 0701 - 12/28 0700 In: 398.3 [I.V.:398.3] Out: 1650 [Urine:1650]  Ventilator  Settings: FiO2 (%):  [80 %] 80 %    LAB RESULTS:  Basic Metabolic Panel: Recent Labs  Lab 04/10/19 0251 04/11/19 0326 04/12/19 0434 04/13/19 0521 04/14/19 0629 04/15/19 0421 04/16/19 0354  NA 138 138 139 136 136 137 135  K 3.1* 4.1 4.1 4.1 3.9 4.2 4.5  CL 105 106 108 104 103 102 101  CO2 24 22 22 22 25 23 23   GLUCOSE 84 250* 220* 211* 143* 200* 300*  BUN 36* 42* 37* 33* 33* 33* 39*  CREATININE 1.33* 1.05* 1.00 0.91 0.90 0.91 1.08*  CALCIUM 8.8* 8.6* 8.6* 8.9 8.8* 9.1 8.7*  MG 1.7 2.4 2.5*  --   --   --   --   PHOS  --   --  4.3  --   --   --   --    Liver Function  Tests: Recent Labs  Lab 04/10/19 0251 04/11/19 0326 04/12/19 0434  AST 32 18 14*  ALT 20 17 16   ALKPHOS 58 54 49  BILITOT 0.9 0.7 0.7  PROT 7.4 7.3 7.3  ALBUMIN 2.6* 2.4* 2.3*   No results for input(s): LIPASE, AMYLASE in the last 168 hours. No results for input(s): AMMONIA in the last 168 hours. CBC: Recent Labs  Lab 04/10/19 0251 04/11/19 0712 04/12/19 0434 04/14/19 0630 04/15/19 0421 04/16/19 0354  WBC 8.5 5.6 6.8 8.2 13.0* 13.4*  NEUTROABS 6.8 4.5 5.9  --   --   --   HGB 10.7* 10.2* 10.0* 10.7* 11.7* 11.6*  HCT 32.3* 31.3* 30.0* 33.0* 35.4* 37.0  MCV 77.6* 83.0 85.2 78.4* 78.3* 82.0  PLT 313 275 297 273 322 284   Cardiac Enzymes: No results for input(s): CKTOTAL, CKMB, CKMBINDEX, TROPONINI in the last 168 hours. BNP: Invalid input(s): POCBNP CBG: Recent Labs  Lab 04/15/19 1001 04/15/19 1146 04/15/19 1822 04/15/19 2142 04/16/19 0750  GLUCAP 140* 128* 229* 185* 232*     IMAGING RESULTS:  Imaging: No results found.     ASSESSMENT AND PLAN    -Multidisciplinary rounds held today  Acute Hypoxic Respiratory Failure -due to post covid pneumonitis - currently on solumedrol 40 bid  -will obtain fungitell  -continue Bronchodilator Therapy  Chronic diastolic CHF -oxygen as needed -Lasix as tolerated -follow  up cardiac enzymes as indicated ICU monitoring GI/Nutrition GI PROPHYLAXIS as indicated DIET-->TF's as tolerated Constipation protocol as indicated  ENDO - ICU hypoglycemic\Hyperglycemia protocol -check FSBS per protocol   ELECTROLYTES -follow labs as needed -replace as needed -pharmacy consultation   DVT/GI PRX ordered -SCDs  TRANSFUSIONS AS NEEDED MONITOR FSBS ASSESS the need for LABS as needed   Critical care provider statement:    Critical care time (minutes):  33   Critical care time was exclusive of:  Separately billable procedures and treating other patients   Critical care was necessary to treat or prevent imminent or  life-threatening deterioration of the following conditions:  Acute hypoxemic respiratory failure, multiple comorbid conditions   Critical care was time spent personally by me on the following activities:  Development of treatment plan with patient or surrogate, discussions with consultants, evaluation of patient's response to treatment, examination of patient, obtaining history from patient or surrogate, ordering and performing treatments and interventions, ordering and review of laboratory studies and re-evaluation of patient's condition.  I assumed direction of critical care for this patient from another provider in my specialty: no    This document was prepared using Dragon voice recognition software and may include unintentional dictation errors.    Vida Rigger, M.D.  Division of Pulmonary & Critical Care Medicine  [facility]

## 2019-04-16 NOTE — Consult Note (Signed)
ANTICOAGULATION CONSULT NOTE  Pharmacy Consult for Heparin Infusion Indication: Hypercoagable state 2/2 COVID disease  Allergies  Allergen Reactions  . Cephalexin Hives  . Codeine Palpitations, Nausea Only, Nausea And Vomiting, Rash and Shortness Of Breath    "makes heart fly, she gets flushed and passes out"  . Doxycycline Rash  . Propoxyphene Rash and Shortness Of Breath    Increase heart rate  . Sulfa Antibiotics Palpitations, Nausea Only, Shortness Of Breath and Hives    "makes heart fly, she gets flushed and passes out"  . Lovenox [Enoxaparin Sodium] Hives  . Hydrocodone Nausea And Vomiting    Hear racing & breaks out into a cold sweat.  . Meropenem Rash    Erythematous, hot, pruritic rash over arms, chest, back, abdomen, and face occurred at the end of meropenem infusion on 02/22/18   Patient Measurements: Height: 5\' 9"  (175.3 cm) Weight: (!) 320 lb (145.2 kg) IBW/kg (Calculated) : 66.2 Heparin Dosing Weight: 95.4  Vital Signs: Temp: 97.9 F (36.6 C) (12/28 0328) Temp Source: Oral (12/28 0328) BP: 125/79 (12/28 0328) Pulse Rate: 79 (12/28 0328)  Labs: Recent Labs    04/14/19 0629 04/14/19 0630 04/15/19 0421 04/16/19 0354  HGB  --  10.7* 11.7* 11.6*  HCT  --  33.0* 35.4* 37.0  PLT  --  273 322 284  HEPARINUNFRC 0.65  --  0.68 0.59  CREATININE 0.90  --  0.91 1.08*    Estimated Creatinine Clearance: 88.7 mL/min (A) (by C-G formula based on SCr of 1.08 mg/dL (H)).   Medical History: Past Medical History:  Diagnosis Date  . Abdominal wall hernia 01/29/2013  . Anxiety   . Arthritis    Rheumatoid  . C. difficile colitis   . Chronic diastolic heart failure ([place])   . Depression   . Diabetes mellitus    states no meds or diet restrictions  at present  . Diastolic CHF ([place])   . Esophagitis   . Fluid retention   . GERD (gastroesophageal reflux disease)   . Hiatal hernia   . Hypertension   . Hypokalemia due to loss of potassium 10/21/2015   Overview:   Associated with 3 weeks of diarrhea  And QT prolongation.  . Hypothyroidism   . IBS (irritable bowel syndrome)   . Moderate episode of recurrent major depressive disorder ([place]) 06/03/2004  . Morbid obesity ([place])   . MRSA (methicillin resistant Staphylococcus aureus) infection 11/2017   left inner thigh abcess  . Neurogenic bladder    has pacemaker  . Neuropathy   . Obesity   . Panic attacks   . Rheumatoid arthritis ([place])   . Sleep apnea    STATES SEVERE, CANT TOLERATE MASK- LAST STUDY YEARS AGO    Medications:  No anticoagulation prior to admission per medication reconcilation  Assessment: 58 y/o F with history of anxiety, diabetes, gastric reflux, hypertension, CHF, obesity who presented to [facility] ED 12/18 with shortness of breath. Previous COVID diagnosis for which she was admitted to [facility] 12/8-12/12. D dimer elevated to 1276.  On 12/23, patient bled extensively through left arm IV site, and therapeutic heparin was placed on hold. No PE noted on 12/18 Chest CT.    Pharmacy has been consulted to initiate heparin drip for hypercoagulable state secondary to COVID disease.  Goal of Therapy:  Heparin level 0.3-0.7 units/ml Monitor platelets by anticoagulation protocol: Yes   Plan:  12/26 0629 HL= 0.65, Heparin level therapeutic.  Will continue current rate of 1700 units/hr. Will f/u  with HL and CBC with AM labs.  CBC stable.  12/27 @ 0421 HL = 0.68, therapeutic, CBC stable.  Continue current rate.  HL and CBC tomorrow am. 12/28 @ 0354 HL = 0.59, therapeutic, CBC stable.  Continue current rate.  HL and CBC tomorrow am.   Wayland Denis, PharmD 04/16/2019 4:59 AM

## 2019-04-16 NOTE — Progress Notes (Signed)
Pt requested that her Wallet to be picked up by her Mother, Delaine Lame [phone]. RN Called security to pick up and secure belongs until mother arrives. Belongings were sanitized double bag and labeled.

## 2019-04-17 DIAGNOSIS — R252 Cramp and spasm: Principal | ICD-10-CM

## 2019-04-17 DIAGNOSIS — G2581 Restless legs syndrome: Principal | ICD-10-CM

## 2019-04-17 LAB — BASIC METABOLIC PANEL
Anion gap: 11 (ref 5–15)
BUN: 43 mg/dL — ABNORMAL HIGH (ref 6–20)
CO2: 23 mmol/L (ref 22–32)
Calcium: 9.1 mg/dL (ref 8.9–10.3)
Chloride: 99 mmol/L (ref 98–111)
Creatinine, Ser: 1.06 mg/dL — ABNORMAL HIGH (ref 0.44–1.00)
GFR calc Af Amer: >60 mL/min (ref >60)
GFR calc non Af Amer: 58 mL/min — ABNORMAL LOW (ref >60)
Glucose, Bld: 170 mg/dL — ABNORMAL HIGH (ref 70–99)
Potassium: 4.2 mmol/L (ref 3.5–5.1)
Sodium: 133 mmol/L — ABNORMAL LOW (ref 135–145)

## 2019-04-17 LAB — GLUCOSE, CAPILLARY
Glucose-Capillary: 110 mg/dL — ABNORMAL HIGH (ref 70–99)
Glucose-Capillary: 119 mg/dL — ABNORMAL HIGH (ref 70–99)
Glucose-Capillary: 146 mg/dL — ABNORMAL HIGH (ref 70–99)
Glucose-Capillary: 161 mg/dL — ABNORMAL HIGH (ref 70–99)
Glucose-Capillary: 179 mg/dL — ABNORMAL HIGH (ref 70–99)
Glucose-Capillary: 188 mg/dL — ABNORMAL HIGH (ref 70–99)
Glucose-Capillary: 207 mg/dL — ABNORMAL HIGH (ref 70–99)

## 2019-04-17 LAB — HEPARIN LEVEL (UNFRACTIONATED)
Heparin Unfractionated: 0.92 IU/mL — ABNORMAL HIGH (ref 0.30–0.70)
Heparin Unfractionated: 0.85 IU/mL — ABNORMAL HIGH (ref 0.30–0.70)
Heparin Unfractionated: 0.41 IU/mL (ref 0.30–0.70)

## 2019-04-17 LAB — CBC
HCT: 39.4 % (ref 36.0–46.0)
Hemoglobin: 12.4 g/dL (ref 12.0–15.0)
MCH: 25.9 pg — ABNORMAL LOW (ref 26.0–34.0)
MCHC: 31.5 g/dL (ref 30.0–36.0)
MCV: 82.3 fL (ref 80.0–100.0)
Platelets: 288 10*3/uL (ref 150–400)
RBC: 4.79 MIL/uL (ref 3.87–5.11)
RDW: 16.3 % — ABNORMAL HIGH (ref 11.5–15.5)
WBC: 13.5 10*3/uL — ABNORMAL HIGH (ref 4.0–10.5)
nRBC: 0 % (ref 0.0–0.2)

## 2019-04-17 NOTE — Unmapped (Signed)
rescheduling refill call for 1/19, patient is currently in the hospital with Covid!

## 2019-04-17 NOTE — Progress Notes (Signed)
Patient maintains her oxygen saturations 85%-91% HFNC heated 40L 80% fiO2.  She is AOx4.  She received insulin this morning sliding scale and for dinner.  She consumes little of her meals because she is in need of dentures.  Coordinated with CN to procure dentures for patients consumption.  Changed her mid-line dressing today.  Her UOP is WDL. Her Vitals WDL. She is mobile in her bed.  She received a bath today and had a B/M.  Patient heparin has been reduced twice today beginning at 1700-1500 and finally resting at 1300 units/hr per lab draw.  She remains in good spirits and positive about her therapy and stay here at [facility].

## 2019-04-17 NOTE — Progress Notes (Signed)
CRITICAL CARE PROGRESS NOTE    Name: [name] [name] MRN: [id number] DOB: 06/07/1960     LOS: 11   SUBJECTIVE FINDINGS & SIGNIFICANT EVENTS   Patient description:  [name] [name] an 58 y.o.femaleformer smoker with a history of diabetes mellitus, morbid obesity and rheumatoid arthritis diagnosed with COVID-19 on December 4. Admitted to [facility] campus from 8 December through 12 December received 5 days of remdesivir. Also received dexamethasone. She has been found with ARDS   Lines / Drains: PIVx2  Cultures / Sepsis markers: Blood culture X1 positive for staph epi with resistance - possibly contaminant  Antibiotics: None    Protocols / Consultants: Hospitalist/pccm   Tests / Events: None   Overnight: 12/28-Weaned down to  75%FiO2, mentation improving.  -12/29- clinically imporved, diet advanced, heparin decreased per PTT  PAST MEDICAL HISTORY   Past Medical History:  Diagnosis Date  . Abdominal wall hernia 01/29/2013  . Anxiety   . Arthritis    Rheumatoid  . C. difficile colitis   . Chronic diastolic heart failure (HCC)   . Depression   . Diabetes mellitus    states no meds or diet restrictions  at present  . Diastolic CHF (HCC)   . Esophagitis   . Fluid retention   . GERD (gastroesophageal reflux disease)   . Hiatal hernia   . Hypertension   . Hypokalemia due to loss of potassium 10/21/2015   Overview:  Associated with 3 weeks of diarrhea  And QT prolongation.  . Hypothyroidism   . IBS (irritable bowel syndrome)   . Moderate episode of recurrent major depressive disorder (HCC) 06/03/2004  . Morbid obesity (HCC)   . MRSA (methicillin resistant Staphylococcus aureus) infection 11/2017   left inner thigh abcess  . Neurogenic bladder    has pacemaker  . Neuropathy   .  Obesity   . Panic attacks   . Rheumatoid arthritis (HCC)   . Sleep apnea    STATES SEVERE, CANT TOLERATE MASK- LAST STUDY YEARS AGO     SURGICAL HISTORY   Past Surgical History:  Procedure Laterality Date  . ABDOMINAL HYSTERECTOMY    . CHOLECYSTECTOMY    . DG GREAT TOE RIGHT FOOT  02/23/2018  . EYE SURGERY     bilateral cataract extraction with IOL  . HERNIA REPAIR     ventral hernia with strangulation  . LAPAROSCOPIC GASTRIC BANDING  03/20/07  . TONSILLECTOMY    . TUBAL LIGATION       FAMILY HISTORY   Family History  Problem Relation Age of Onset  . Heart failure Father   . Bipolar disorder Father   . Alcohol abuse Father   . Anxiety disorder Father   . Depression Father   . Heart disease Brother   . Heart attack Brother 9       MI s/p stents placed  . Anxiety disorder Sister   . Depression Sister   . Anxiety disorder Sister   . Depression Sister   . Bipolar disorder Sister   . Alcohol abuse Sister   . Drug abuse Sister   . Heart attack Brother      SOCIAL HISTORY   Social History   Tobacco Use  . Smoking status: Former Smoker    Packs/day: 2.00    Years: 27.00    Pack years: 54.00    Types: Cigarettes    Quit date: 07/30/1999    Years since quitting: 19.7  . Smokeless tobacco: Never Used  Substance  Use Topics  . Alcohol use: No  . Drug use: No     MEDICATIONS   Current Medication:  Current Facility-Administered Medications:  .  acetaminophen (TYLENOL) tablet 650 mg, 650 mg, Oral, Q6H PRN, Lorretta HarpNiu, Xilin, MD, 650 mg at 04/10/19 0224 .  albuterol (VENTOLIN HFA) 108 (90 Base) MCG/ACT inhaler 2 puff, 2 puff, Inhalation, Q4H PRN, Lorretta HarpNiu, Xilin, MD .  ALPRAZolam Prudy Feeler(XANAX) tablet 0.5 mg, 0.5 mg, Oral, TID PRN, Erin FullingKasa, Kurian, MD, 0.5 mg at 04/16/19 2155 .  ascorbic acid (VITAMIN C) tablet 500 mg, 500 mg, Oral, Daily, Lorretta HarpNiu, Xilin, MD, 500 mg at 04/16/19 1100 .  atorvastatin (LIPITOR) tablet 80 mg, 80 mg, Oral, q1800, Salena SanerGonzalez, Carmen L, MD, 80 mg at 04/16/19  2127 .  busPIRone (BUSPAR) tablet 10 mg, 10 mg, Oral, BID, Lorretta HarpNiu, Xilin, MD, 10 mg at 04/16/19 2127 .  calcium-vitamin D (OSCAL WITH D) 500-200 MG-UNIT per tablet 1 tablet, 1 tablet, Oral, Daily, Lorretta HarpNiu, Xilin, MD, 1 tablet at 04/16/19 [phone]  Chlorhexidine Gluconate Cloth 2 % PADS 6 each, 6 each, Topical, Daily, Danford, Earl Liteshristopher P, MD, 6 each at 04/16/19 1100 .  cholecalciferol (VITAMIN D3) tablet 1,000 Units, 1,000 Units, Oral, Q breakfast, Lorretta HarpNiu, Xilin, MD, 1,000 Units at 04/16/19 1100 .  ciclesonide (ALVESCO) 160 MCG/ACT inhaler 4 puff, 4 puff, Inhalation, BID, Salena SanerGonzalez, Carmen L, MD, 4 puff at 04/16/19 2236 .  colchicine tablet 0.6 mg, 0.6 mg, Oral, Daily, Sarina SerGonzalez, Carmen L, MD, 0.6 mg at 04/16/19 1100 .  darifenacin (ENABLEX) 24 hr tablet 7.5 mg, 7.5 mg, Oral, Daily, Lorretta HarpNiu, Xilin, MD, 7.5 mg at 04/16/19 1100 .  famotidine (PEPCID) tablet 40 mg, 40 mg, Oral, Daily, Salena SanerGonzalez, Carmen L, MD, 40 mg at 04/16/19 1100 .  FLUoxetine (PROZAC) capsule 20 mg, 20 mg, Oral, Daily, Lorretta HarpNiu, Xilin, MD, 20 mg at 04/16/19 1100 .  folic acid (FOLVITE) tablet 1 mg, 1 mg, Oral, Daily, Lorretta HarpNiu, Xilin, MD, 1 mg at 04/16/19 1100 .  furosemide (LASIX) injection 20 mg, 20 mg, Intravenous, Q12H, Vida RiggerAleskerov, Keeshawn Fakhouri, MD, 20 mg at 04/16/19 2201 .  guaiFENesin (ROBITUSSIN) 100 MG/5ML solution 100 mg, 5 mL, Oral, Q4H PRN, Ogan, Okoronkwo U, MD, 100 mg at 04/16/19 1202 .  heparin ADULT infusion 100 units/mL (25000 units/25350mL sodium chloride 0.45%), 1,500 Units/hr, Intravenous, Continuous, Kasa, Kurian, MD, Last Rate: 17 mL/hr at 04/17/19 0800, 1,700 Units/hr at 04/17/19 0800 .  insulin aspart (novoLOG) injection 0-20 Units, 0-20 Units, Subcutaneous, Q4H, Vida RiggerAleskerov, Teyana Pierron, MD, 4 Units at 04/17/19 0831 .  insulin aspart (novoLOG) injection 0-5 Units, 0-5 Units, Subcutaneous, QHS, Eugenie NorrieBlakeney, [name] G, NP, 3 Units at 04/12/19 2101 .  insulin aspart (novoLOG) injection 6 Units, 6 Units, Subcutaneous, TID WC, Danford, Earl Liteshristopher P, MD, 6 Units at  04/17/19 0831 .  insulin glargine (LANTUS) injection 65 Units, 65 Units, Subcutaneous, Daily, Danford, Earl Liteshristopher P, MD, 65 Units at 04/17/19 0831 .  levothyroxine (SYNTHROID) tablet 88 mcg, 88 mcg, Oral, QAC breakfast, Lorretta HarpNiu, Xilin, MD, 88 mcg at 04/16/19 0836 .  linagliptin (TRADJENTA) tablet 5 mg, 5 mg, Oral, Daily, Danford, Earl Liteshristopher P, MD, 5 mg at 04/16/19 1100 .  lip balm (BLISTEX) ointment, , Topical, PRN, Harlon DittyKeene, Jeremiah D, NP .  magic mouthwash, 15 mL, Oral, TID PRN, Salena SanerGonzalez, Carmen L, MD, 15 mL at 04/10/19 0534 .  MEDLINE mouth rinse, 15 mL, Mouth Rinse, BID, Blakeney, Neldon Newportana G, NP, 15 mL at 04/16/19 2230 .  methylPREDNISolone sodium succinate (SOLU-MEDROL) 40 mg/mL injection 40 mg, 40 mg, Intravenous, Q12H,  Eugenie Norrie, NP, 40 mg at 04/16/19 2129 .  mupirocin ointment (BACTROBAN) 2 %, , Nasal, BID, Salena Saner, MD, Given at 04/16/19 2130 .  ondansetron (ZOFRAN) injection 4 mg, 4 mg, Intravenous, Q8H PRN, Lorretta Harp, MD, 4 mg at 04/08/19 1104 .  oxyCODONE (Oxy IR/ROXICODONE) immediate release tablet 5 mg, 5 mg, Oral, Q6H PRN, Lorretta Harp, MD, 5 mg at 04/12/19 2101 .  polyethylene glycol (MIRALAX / GLYCOLAX) packet 17 g, 17 g, Oral, Daily PRN, Harlon Ditty D, NP .  pramipexole (MIRAPEX) tablet 0.125 mg, 0.125 mg, Oral, BID, Lorretta Harp, MD, 0.125 mg at 04/16/19 2128 .  pregabalin (LYRICA) capsule 150 mg, 150 mg, Oral, TID, Lorretta Harp, MD, 150 mg at 04/16/19 2129 .  sodium chloride flush (NS) 0.9 % injection 10-40 mL, 10-40 mL, Intracatheter, Q12H, Kasa, Kurian, MD, 10 mL at 04/16/19 2130 .  sodium chloride flush (NS) 0.9 % injection 10-40 mL, 10-40 mL, Intracatheter, PRN, Erin Fulling, MD .  zinc sulfate capsule 220 mg, 220 mg, Oral, Daily, Lorretta Harp, MD, 220 mg at 04/16/19 1100 .  zolpidem (AMBIEN) tablet 5 mg, 5 mg, Oral, QHS, Lorretta Harp, MD, 5 mg at 04/16/19 2128    ALLERGIES   Cephalexin, Codeine, Doxycycline, Propoxyphene, Sulfa antibiotics, Lovenox [enoxaparin  sodium], Hydrocodone, and Meropenem    REVIEW OF SYSTEMS    Patient gives thumbs up for negatives and is negative except as per subjective findings  PHYSICAL EXAMINATION   Vital Signs: Temp:  [97.8 F (36.6 C)-98.7 F (37.1 C)] 97.8 F (36.6 C) (12/29 0800) Pulse Rate:  [77-87] 77 (12/29 0000) Resp:  [17-29] 22 (12/29 0800) BP: (84-141)/(59-115) 117/86 (12/29 0800) SpO2:  [85 %-96 %] 87 % (12/29 0800) FiO2 (%):  [80 %-100 %] 100 % (12/29 0800)  COVID-19 DISASTER DECLARATION:   FULL CONTACT PHYSICAL EXAMINATION WAS NOT POSSIBLE DUE TO TREATMENT OF COVID-19  AND CONSERVATION OF PERSONAL PROTECTIVE EQUIPMENT, LIMITED EXAM FINDINGS INCLUDE-  Physical Exam  Constitutional: She appears well-developed. No distress.  HENT:  Head: Normocephalic and atraumatic.  Eyes: EOM are normal. Left eye exhibits no discharge.  Neck: No tracheal deviation present.  Cardiovascular: Normal rate and regular rhythm.  Pulmonary/Chest: Effort normal. No respiratory distress.  Abdominal: There is no guarding.  Musculoskeletal:        General: No edema.     Cervical back: Normal range of motion.  Neurological: She is alert.  Skin: No rash noted. She is not diaphoretic. No erythema. There is pallor.     Patient assessed or the symptoms described in the history of present illness.  In the context of the Global COVID-19 pandemic, which necessitated consideration that the patient might be at risk for infection with the SARS-CoV-2 virus that causes COVID-19, Institutional protocols and algorithms that pertain to the evaluation of patients at risk for COVID-19 are in a state of rapid change based on information released by regulatory bodies including the CDC and federal and state organizations. These policies and algorithms were followed during the patient's care while in hospital.   PERTINENT DATA     Infusions: . heparin 1,700 Units/hr (04/17/19 0800)   Scheduled Medications: . vitamin C  500  mg Oral Daily  . atorvastatin  80 mg Oral q1800  . busPIRone  10 mg Oral BID  . calcium-vitamin D  1 tablet Oral Daily  . Chlorhexidine Gluconate Cloth  6 each Topical Daily  . cholecalciferol  1,000 Units Oral Q breakfast  . ciclesonide  4 puff Inhalation BID  . colchicine  0.6 mg Oral Daily  . darifenacin  7.5 mg Oral Daily  . famotidine  40 mg Oral Daily  . FLUoxetine  20 mg Oral Daily  . folic acid  1 mg Oral Daily  . furosemide  20 mg Intravenous Q12H  . insulin aspart  0-20 Units Subcutaneous Q4H  . insulin aspart  0-5 Units Subcutaneous QHS  . insulin aspart  6 Units Subcutaneous TID WC  . insulin glargine  65 Units Subcutaneous Daily  . levothyroxine  88 mcg Oral QAC breakfast  . linagliptin  5 mg Oral Daily  . mouth rinse  15 mL Mouth Rinse BID  . methylPREDNISolone (SOLU-MEDROL) injection  40 mg Intravenous Q12H  . mupirocin ointment   Nasal BID  . pramipexole  0.125 mg Oral BID  . pregabalin  150 mg Oral TID  . sodium chloride flush  10-40 mL Intracatheter Q12H  . zinc sulfate  220 mg Oral Daily  . zolpidem  5 mg Oral QHS   PRN Medications: acetaminophen, albuterol, ALPRAZolam, guaiFENesin, lip balm, magic mouthwash, ondansetron (ZOFRAN) IV, oxyCODONE, polyethylene glycol, sodium chloride flush Hemodynamic parameters:   Intake/Output: 12/28 0701 - 12/29 0700 In: 395.7 [I.V.:395.7] Out: 1895 [Urine:1895]  Ventilator  Settings: FiO2 (%):  [80 %-100 %] 100 %    LAB RESULTS:  Basic Metabolic Panel: Recent Labs  Lab 04/11/19 0326 04/12/19 0434 04/13/19 0521 04/14/19 0629 04/15/19 0421 04/16/19 0354 04/17/19 0620  NA 138 139 136 136 137 135 133*  K 4.1 4.1 4.1 3.9 4.2 4.5 4.2  CL 106 108 104 103 102 101 99  CO2 22 22 22 25 23 23 23   GLUCOSE 250* 220* 211* 143* 200* 300* 170*  BUN 42* 37* 33* 33* 33* 39* 43*  CREATININE 1.05* 1.00 0.91 0.90 0.91 1.08* 1.06*  CALCIUM 8.6* 8.6* 8.9 8.8* 9.1 8.7* 9.1  MG 2.4 2.5*  --   --   --   --   --   PHOS  --  4.3   --   --   --   --   --    Liver Function Tests: Recent Labs  Lab 04/11/19 0326 04/12/19 0434  AST 18 14*  ALT 17 16  ALKPHOS 54 49  BILITOT 0.7 0.7  PROT 7.3 7.3  ALBUMIN 2.4* 2.3*   No results for input(s): LIPASE, AMYLASE in the last 168 hours. No results for input(s): AMMONIA in the last 168 hours. CBC: Recent Labs  Lab 04/11/19 0712 04/12/19 0434 04/14/19 0630 04/15/19 0421 04/16/19 0354 04/17/19 0620  WBC 5.6 6.8 8.2 13.0* 13.4* 13.5*  NEUTROABS 4.5 5.9  --   --   --   --   HGB 10.2* 10.0* 10.7* 11.7* 11.6* 12.4  HCT 31.3* 30.0* 33.0* 35.4* 37.0 39.4  MCV 83.0 85.2 78.4* 78.3* 82.0 82.3  PLT 275 297 273 322 284 288   Cardiac Enzymes: No results for input(s): CKTOTAL, CKMB, CKMBINDEX, TROPONINI in the last 168 hours. BNP: Invalid input(s): POCBNP CBG: Recent Labs  Lab 04/16/19 1547 04/16/19 2125 04/17/19 0138 04/17/19 0441 04/17/19 0811  GLUCAP 288* 240* 146* 179* 161*     IMAGING RESULTS:  Imaging: No results found.     ASSESSMENT AND PLAN    -Multidisciplinary rounds held today  Acute Hypoxic Respiratory Failure -due to post covid pneumonitis - currently on solumedrol 40 bid  -will obtain fungitell  -continue Bronchodilator Therapy  Chronic diastolic CHF -oxygen as needed -Lasix  as tolerated -follow up cardiac enzymes as indicated ICU monitoring GI/Nutrition GI PROPHYLAXIS as indicated DIET-->TF's as tolerated Constipation protocol as indicated  ENDO - ICU hypoglycemic\Hyperglycemia protocol -check FSBS per protocol   ELECTROLYTES -follow labs as needed -replace as needed -pharmacy consultation   DVT/GI PRX ordered -SCDs  TRANSFUSIONS AS NEEDED MONITOR FSBS ASSESS the need for LABS as needed   Critical care provider statement:    Critical care time (minutes):  33   Critical care time was exclusive of:  Separately billable procedures and treating other patients   Critical care was necessary to treat or prevent  imminent or life-threatening deterioration of the following conditions:  Acute hypoxemic respiratory failure, multiple comorbid conditions   Critical care was time spent personally by me on the following activities:  Development of treatment plan with patient or surrogate, discussions with consultants, evaluation of patient's response to treatment, examination of patient, obtaining history from patient or surrogate, ordering and performing treatments and interventions, ordering and review of laboratory studies and re-evaluation of patient's condition.  I assumed direction of critical care for this patient from another provider in my specialty: no    This document was prepared using Dragon voice recognition software and may include unintentional dictation errors.    Ottie Glazier, M.D.  Division of [facility]

## 2019-04-17 NOTE — Consult Note (Signed)
ANTICOAGULATION CONSULT NOTE  Pharmacy Consult for Heparin Infusion Indication: Hypercoagable state 2/2 COVID disease  Allergies  Allergen Reactions  . Cephalexin Hives  . Codeine Palpitations, Nausea Only, Nausea And Vomiting, Rash and Shortness Of Breath    "makes heart fly, she gets flushed and passes out"  . Doxycycline Rash  . Propoxyphene Rash and Shortness Of Breath    Increase heart rate  . Sulfa Antibiotics Palpitations, Nausea Only, Shortness Of Breath and Hives    "makes heart fly, she gets flushed and passes out"  . Lovenox [Enoxaparin Sodium] Hives  . Hydrocodone Nausea And Vomiting    Hear racing & breaks out into a cold sweat.  . Meropenem Rash    Erythematous, hot, pruritic rash over arms, chest, back, abdomen, and face occurred at the end of meropenem infusion on 02/22/18   Patient Measurements: Height: 5\' 9"  (175.3 cm) Weight: (!) 320 lb (145.2 kg) IBW/kg (Calculated) : 66.2 Heparin Dosing Weight: 95.4  Vital Signs: Temp: 97.7 F (36.5 C) (12/29 1200) Temp Source: Oral (12/29 1200) BP: 99/81 (12/29 1400) Pulse Rate: 105 (12/29 1400)  Labs: Recent Labs    04/15/19 0421 04/16/19 0354 04/17/19 0620 04/17/19 1452  HGB 11.7* 11.6* 12.4  --   HCT 35.4* 37.0 39.4  --   PLT 322 284 288  --   HEPARINUNFRC 0.68 0.59 0.92* 0.85*  CREATININE 0.91 1.08* 1.06*  --     Estimated Creatinine Clearance: 90.4 mL/min (A) (by C-G formula based on SCr of 1.06 mg/dL (H)).   Medical History: Past Medical History:  Diagnosis Date  . Abdominal wall hernia 01/29/2013  . Anxiety   . Arthritis    Rheumatoid  . C. difficile colitis   . Chronic diastolic heart failure (HCC)   . Depression   . Diabetes mellitus    states no meds or diet restrictions  at present  . Diastolic CHF (HCC)   . Esophagitis   . Fluid retention   . GERD (gastroesophageal reflux disease)   . Hiatal hernia   . Hypertension   . Hypokalemia due to loss of potassium 10/21/2015   Overview:   Associated with 3 weeks of diarrhea  And QT prolongation.  . Hypothyroidism   . IBS (irritable bowel syndrome)   . Moderate episode of recurrent major depressive disorder (HCC) 06/03/2004  . Morbid obesity (HCC)   . MRSA (methicillin resistant Staphylococcus aureus) infection 11/2017   left inner thigh abcess  . Neurogenic bladder    has pacemaker  . Neuropathy   . Obesity   . Panic attacks   . Rheumatoid arthritis (HCC)   . Sleep apnea    STATES SEVERE, CANT TOLERATE MASK- LAST STUDY YEARS AGO    Medications:  No anticoagulation prior to admission per medication reconcilation  Assessment: 58 y/o F with history of anxiety, diabetes, gastric reflux, hypertension, CHF, obesity who presented to [facility] ED 12/18 with shortness of breath. Previous COVID diagnosis for which she was admitted to [facility] 12/8-12/12. D dimer elevated to 1276.  On 12/23, patient bled extensively through left arm IV site, and therapeutic heparin was placed on hold. No PE noted on 12/18 Chest CT.    Pharmacy has been consulted to initiate heparin drip for hypercoagulable state secondary to COVID disease.  Heparin previously therapeutic for several days on previous rate of 1700 units/hr. As of 12/29 AM level returned supratherapeutic and rate was decreased to 1500 units/hr. Confirmed with RN that heparin is being drawn appropriately by lab.  Goal of Therapy:  Heparin level 0.3-0.7 units/ml Monitor platelets by anticoagulation protocol: Yes   Plan:  -12/29 1452 HL 0.85, supratherapeutic. Will decrease drip to 1300 units/hr. -HL in 6 hours -Daily CBC per protocol  [place] Resident 04/17/2019 3:51 PM

## 2019-04-17 NOTE — Consult Note (Signed)
ANTICOAGULATION CONSULT NOTE  Pharmacy Consult for Heparin Infusion Indication: Hypercoagable state 2/2 COVID disease  Allergies  Allergen Reactions  . Cephalexin Hives  . Codeine Palpitations, Nausea Only, Nausea And Vomiting, Rash and Shortness Of Breath    "makes heart fly, she gets flushed and passes out"  . Doxycycline Rash  . Propoxyphene Rash and Shortness Of Breath    Increase heart rate  . Sulfa Antibiotics Palpitations, Nausea Only, Shortness Of Breath and Hives    "makes heart fly, she gets flushed and passes out"  . Lovenox [Enoxaparin Sodium] Hives  . Hydrocodone Nausea And Vomiting    Hear racing & breaks out into a cold sweat.  . Meropenem Rash    Erythematous, hot, pruritic rash over arms, chest, back, abdomen, and face occurred at the end of meropenem infusion on 02/22/18   Patient Measurements: Height: 5\' 9"  (175.3 cm) Weight: (!) 320 lb (145.2 kg) IBW/kg (Calculated) : 66.2 Heparin Dosing Weight: 95.4  Vital Signs: Temp: 97.7 F (36.5 C) (12/29 1200) Temp Source: Oral (12/29 1200) BP: 114/78 (12/29 1200) Pulse Rate: 101 (12/29 1100)  Labs: Recent Labs    04/15/19 0421 04/16/19 0354 04/17/19 0620  HGB 11.7* 11.6* 12.4  HCT 35.4* 37.0 39.4  PLT 322 284 288  HEPARINUNFRC 0.68 0.59 0.92*  CREATININE 0.91 1.08* 1.06*    Estimated Creatinine Clearance: 90.4 mL/min (A) (by C-G formula based on SCr of 1.06 mg/dL (H)).   Medical History: Past Medical History:  Diagnosis Date  . Abdominal wall hernia 01/29/2013  . Anxiety   . Arthritis    Rheumatoid  . C. difficile colitis   . Chronic diastolic heart failure (HCC)   . Depression   . Diabetes mellitus    states no meds or diet restrictions  at present  . Diastolic CHF (HCC)   . Esophagitis   . Fluid retention   . GERD (gastroesophageal reflux disease)   . Hiatal hernia   . Hypertension   . Hypokalemia due to loss of potassium 10/21/2015   Overview:  Associated with 3 weeks of diarrhea  And QT  prolongation.  . Hypothyroidism   . IBS (irritable bowel syndrome)   . Moderate episode of recurrent major depressive disorder (HCC) 06/03/2004  . Morbid obesity (HCC)   . MRSA (methicillin resistant Staphylococcus aureus) infection 11/2017   left inner thigh abcess  . Neurogenic bladder    has pacemaker  . Neuropathy   . Obesity   . Panic attacks   . Rheumatoid arthritis (HCC)   . Sleep apnea    STATES SEVERE, CANT TOLERATE MASK- LAST STUDY YEARS AGO    Medications:  No anticoagulation prior to admission per medication reconcilation  Assessment: 58 y/o F with history of anxiety, diabetes, gastric reflux, hypertension, CHF, obesity who presented to [facility] ED 12/18 with shortness of breath. Previous COVID diagnosis for which she was admitted to [facility] 12/8-12/12. D dimer elevated to 1276.  On 12/23, patient bled extensively through left arm IV site, and therapeutic heparin was placed on hold. No PE noted on 12/18 Chest CT.    Pharmacy has been consulted to initiate heparin drip for hypercoagulable state secondary to COVID disease.  Goal of Therapy:  Heparin level 0.3-0.7 units/ml Monitor platelets by anticoagulation protocol: Yes   Plan:  12/29 0620 HL 0.92, supratherapeutic. Heparin previously therapeutic for several days on previous rate. Heparin drip decreased to 1500 units/hr at 0900. HL ordered for 1500. Will continue to monitor.  Rashaan Wyles 1/30,  PharmD 04/17/2019 1:56 PM

## 2019-04-18 LAB — BASIC METABOLIC PANEL
Anion gap: 13 (ref 5–15)
BUN: 54 mg/dL — ABNORMAL HIGH (ref 6–20)
CO2: 25 mmol/L (ref 22–32)
Calcium: 9.4 mg/dL (ref 8.9–10.3)
Chloride: 98 mmol/L (ref 98–111)
Creatinine, Ser: 1.37 mg/dL — ABNORMAL HIGH (ref 0.44–1.00)
GFR calc Af Amer: 49 mL/min — ABNORMAL LOW (ref >60)
GFR calc non Af Amer: 43 mL/min — ABNORMAL LOW (ref >60)
Glucose, Bld: 189 mg/dL — ABNORMAL HIGH (ref 70–99)
Potassium: 4.6 mmol/L (ref 3.5–5.1)
Sodium: 136 mmol/L (ref 135–145)

## 2019-04-18 LAB — CBC
HCT: 39.8 % (ref 36.0–46.0)
Hemoglobin: 12.4 g/dL (ref 12.0–15.0)
MCH: 25.6 pg — ABNORMAL LOW (ref 26.0–34.0)
MCHC: 31.2 g/dL (ref 30.0–36.0)
MCV: 82.1 fL (ref 80.0–100.0)
Platelets: 302 10*3/uL (ref 150–400)
RBC: 4.85 MIL/uL (ref 3.87–5.11)
RDW: 16.7 % — ABNORMAL HIGH (ref 11.5–15.5)
WBC: 19.3 10*3/uL — ABNORMAL HIGH (ref 4.0–10.5)
nRBC: 0 % (ref 0.0–0.2)

## 2019-04-18 LAB — GLUCOSE, CAPILLARY
Glucose-Capillary: 165 mg/dL — ABNORMAL HIGH (ref 70–99)
Glucose-Capillary: 183 mg/dL — ABNORMAL HIGH (ref 70–99)
Glucose-Capillary: 203 mg/dL — ABNORMAL HIGH (ref 70–99)
Glucose-Capillary: 376 mg/dL — ABNORMAL HIGH (ref 70–99)
Glucose-Capillary: 344 mg/dL — ABNORMAL HIGH (ref 70–99)

## 2019-04-18 LAB — HEPARIN LEVEL (UNFRACTIONATED): Heparin Unfractionated: 0.33 IU/mL (ref 0.30–0.70)

## 2019-04-18 MED ORDER — ENOXAPARIN SODIUM 80 MG/0.8ML ~~LOC~~ SOLN
0.5000 mg/kg | Freq: Two times a day (BID) | SUBCUTANEOUS | Status: DC
  Administered 2019-04-18 – 2019-04-26 (×17): 75 mg via SUBCUTANEOUS
  Filled 2019-04-18 (×16): qty 0.8

## 2019-04-18 MED ORDER — FUROSEMIDE 10 MG/ML IJ SOLN
40.0000 mg | Freq: Two times a day (BID) | INTRAMUSCULAR | Status: DC
  Administered 2019-04-18 – 2019-04-19 (×4): 40 mg via INTRAVENOUS
  Filled 2019-04-18 (×4): qty 4

## 2019-04-18 NOTE — Consult Note (Signed)
ANTICOAGULATION CONSULT NOTE  Pharmacy Consult for Heparin Infusion Indication: Hypercoagable state 2/2 COVID disease  Allergies  Allergen Reactions  . Cephalexin Hives  . Codeine Palpitations, Nausea Only, Nausea And Vomiting, Rash and Shortness Of Breath    "makes heart fly, she gets flushed and passes out"  . Doxycycline Rash  . Propoxyphene Rash and Shortness Of Breath    Increase heart rate  . Sulfa Antibiotics Palpitations, Nausea Only, Shortness Of Breath and Hives    "makes heart fly, she gets flushed and passes out"  . Lovenox [Enoxaparin Sodium] Hives  . Hydrocodone Nausea And Vomiting    Hear racing & breaks out into a cold sweat.  . Meropenem Rash    Erythematous, hot, pruritic rash over arms, chest, back, abdomen, and face occurred at the end of meropenem infusion on 02/22/18   Patient Measurements: Height: 5\' 9"  (175.3 cm) Weight: (!) 320 lb (145.2 kg) IBW/kg (Calculated) : 66.2 Heparin Dosing Weight: 95.4  Vital Signs: Temp: 98.8 F (37.1 C) (12/30 0000) Temp Source: Oral (12/30 0000) BP: 112/79 (12/30 0000) Pulse Rate: 90 (12/30 0000)  Labs: Recent Labs    04/15/19 0421 04/16/19 0354 04/17/19 0620 04/17/19 1452 04/17/19 2147  HGB 11.7* 11.6* 12.4  --   --   HCT 35.4* 37.0 39.4  --   --   PLT 322 284 288  --   --   HEPARINUNFRC 0.68 0.59 0.92* 0.85* 0.41  CREATININE 0.91 1.08* 1.06*  --   --     Estimated Creatinine Clearance: 90.4 mL/min (A) (by C-G formula based on SCr of 1.06 mg/dL (H)).   Medical History: Past Medical History:  Diagnosis Date  . Abdominal wall hernia 01/29/2013  . Anxiety   . Arthritis    Rheumatoid  . C. difficile colitis   . Chronic diastolic heart failure (HCC)   . Depression   . Diabetes mellitus    states no meds or diet restrictions  at present  . Diastolic CHF (HCC)   . Esophagitis   . Fluid retention   . GERD (gastroesophageal reflux disease)   . Hiatal hernia   . Hypertension   . Hypokalemia due to loss  of potassium 10/21/2015   Overview:  Associated with 3 weeks of diarrhea  And QT prolongation.  . Hypothyroidism   . IBS (irritable bowel syndrome)   . Moderate episode of recurrent major depressive disorder (HCC) 06/03/2004  . Morbid obesity (HCC)   . MRSA (methicillin resistant Staphylococcus aureus) infection 11/2017   left inner thigh abcess  . Neurogenic bladder    has pacemaker  . Neuropathy   . Obesity   . Panic attacks   . Rheumatoid arthritis (HCC)   . Sleep apnea    STATES SEVERE, CANT TOLERATE MASK- LAST STUDY YEARS AGO    Medications:  No anticoagulation prior to admission per medication reconcilation  Assessment: 58 y/o F with history of anxiety, diabetes, gastric reflux, hypertension, CHF, obesity who presented to [facility] ED 12/18 with shortness of breath. Previous COVID diagnosis for which she was admitted to [facility] 12/8-12/12. D dimer elevated to 1276.  On 12/23, patient bled extensively through left arm IV site, and therapeutic heparin was placed on hold. No PE noted on 12/18 Chest CT.    Pharmacy has been consulted to initiate heparin drip for hypercoagulable state secondary to COVID disease.  Heparin previously therapeutic for several days on previous rate of 1700 units/hr. As of 12/29 AM level returned supratherapeutic and rate was  decreased to 1500 units/hr. Confirmed with RN that heparin is being drawn appropriately by lab.   Goal of Therapy:  Heparin level 0.3-0.7 units/ml Monitor platelets by anticoagulation protocol: Yes   Plan:  12/30 @ 2200 HL 0.41 therapeutic. Will continue current rate and will recheck HL w/ am labs. CBC stable will continue to monitor.  Tobie Lords, PharmD, BCPS Clinical Pharmacist 04/18/2019 1:00 AM

## 2019-04-18 NOTE — Progress Notes (Signed)
CRITICAL CARE PROGRESS NOTE    Name: [name] [name] MRN: [id number] DOB: 1960/06/17     LOS: 12   SUBJECTIVE FINDINGS & SIGNIFICANT EVENTS   Patient description:  [name] [name] an 58 y.o.femaleformer smoker with a history of diabetes mellitus, morbid obesity and rheumatoid arthritis diagnosed with COVID-19 on December 4. Admitted to [facility] from 8 December through 12 December received 5 days of remdesivir. Also received dexamethasone. She has been found with ARDS   Lines / Drains: PIVx2  Cultures / Sepsis markers: Blood culture X1 positive for staph epi with resistance - possibly contaminant  Antibiotics: None    Protocols / Consultants: Hospitalist/pccm   Tests / Events: None   Overnight: 12/28-Weaned down to  75%FiO2, mentation improving.  -12/29- clinically imporved, diet advanced, heparin decreased per PTT 12/30 -HFNC75%, self proning  PAST MEDICAL HISTORY   Past Medical History:  Diagnosis Date  . Abdominal wall hernia 01/29/2013  . Anxiety   . Arthritis    Rheumatoid  . C. difficile colitis   . Chronic diastolic heart failure (HCC)   . Depression   . Diabetes mellitus    states no meds or diet restrictions  at present  . Diastolic CHF (HCC)   . Esophagitis   . Fluid retention   . GERD (gastroesophageal reflux disease)   . Hiatal hernia   . Hypertension   . Hypokalemia due to loss of potassium 10/21/2015   Overview:  Associated with 3 weeks of diarrhea  And QT prolongation.  . Hypothyroidism   . IBS (irritable bowel syndrome)   . Moderate episode of recurrent major depressive disorder (HCC) 06/03/2004  . Morbid obesity (HCC)   . MRSA (methicillin resistant Staphylococcus aureus) infection 11/2017   left inner thigh abcess  . Neurogenic bladder    has  pacemaker  . Neuropathy   . Obesity   . Panic attacks   . Rheumatoid arthritis (HCC)   . Sleep apnea    STATES SEVERE, CANT TOLERATE MASK- LAST STUDY YEARS AGO     SURGICAL HISTORY   Past Surgical History:  Procedure Laterality Date  . ABDOMINAL HYSTERECTOMY    . CHOLECYSTECTOMY    . DG GREAT TOE RIGHT FOOT  02/23/2018  . EYE SURGERY     bilateral cataract extraction with IOL  . HERNIA REPAIR     ventral hernia with strangulation  . LAPAROSCOPIC GASTRIC BANDING  03/20/07  . TONSILLECTOMY    . TUBAL LIGATION       FAMILY HISTORY   Family History  Problem Relation Age of Onset  . Heart failure Father   . Bipolar disorder Father   . Alcohol abuse Father   . Anxiety disorder Father   . Depression Father   . Heart disease Brother   . Heart attack Brother 7       MI s/p stents placed  . Anxiety disorder Sister   . Depression Sister   . Anxiety disorder Sister   . Depression Sister   . Bipolar disorder Sister   . Alcohol abuse Sister   . Drug abuse Sister   . Heart attack Brother      SOCIAL HISTORY   Social History   Tobacco Use  . Smoking status: Former Smoker    Packs/day: 2.00    Years: 27.00    Pack years: 54.00    Types: Cigarettes    Quit date: 07/30/1999    Years since quitting: 19.7  . Smokeless tobacco:  Never Used  Substance Use Topics  . Alcohol use: No  . Drug use: No     MEDICATIONS   Current Medication:  Current Facility-Administered Medications:  .  acetaminophen (TYLENOL) tablet 650 mg, 650 mg, Oral, Q6H PRN, Lorretta Harp, MD, 650 mg at 04/10/19 0224 .  albuterol (VENTOLIN HFA) 108 (90 Base) MCG/ACT inhaler 2 puff, 2 puff, Inhalation, Q4H PRN, Lorretta Harp, MD .  ALPRAZolam Prudy Feeler) tablet 0.5 mg, 0.5 mg, Oral, TID PRN, Erin Fulling, MD, 0.5 mg at 04/17/19 2127 .  ascorbic acid (VITAMIN C) tablet 500 mg, 500 mg, Oral, Daily, Lorretta Harp, MD, 500 mg at 04/18/19 0943 .  atorvastatin (LIPITOR) tablet 80 mg, 80 mg, Oral, q1800, Salena Saner, MD, 80 mg at 04/17/19 2127 .  busPIRone (BUSPAR) tablet 10 mg, 10 mg, Oral, BID, Lorretta Harp, MD, 10 mg at 04/18/19 0947 .  calcium-vitamin D (OSCAL WITH D) 500-200 MG-UNIT per tablet 1 tablet, 1 tablet, Oral, Daily, Lorretta Harp, MD, 1 tablet at 04/18/19 0944 .  Chlorhexidine Gluconate Cloth 2 % PADS 6 each, 6 each, Topical, Daily, Danford, Earl Lites, MD, 6 each at 04/18/19 [phone]  cholecalciferol (VITAMIN D3) tablet 1,000 Units, 1,000 Units, Oral, Q breakfast, Lorretta Harp, MD, 1,000 Units at 04/18/19 [phone] .  ciclesonide (ALVESCO) 160 MCG/ACT inhaler 4 puff, 4 puff, Inhalation, BID, Salena Saner, MD, 4 puff at 04/18/19 0957 .  colchicine tablet 0.6 mg, 0.6 mg, Oral, Daily, Salena Saner, MD, 0.6 mg at 04/17/19 1102 .  darifenacin (ENABLEX) 24 hr tablet 7.5 mg, 7.5 mg, Oral, Daily, Lorretta Harp, MD, 7.5 mg at 04/18/19 0948 .  famotidine (PEPCID) tablet 40 mg, 40 mg, Oral, Daily, Salena Saner, MD, 40 mg at 04/18/19 0944 .  FLUoxetine (PROZAC) capsule 20 mg, 20 mg, Oral, Daily, Lorretta Harp, MD, 20 mg at 04/18/19 0945 .  folic acid (FOLVITE) tablet 1 mg, 1 mg, Oral, Daily, Lorretta Harp, MD, 1 mg at 04/18/19 0944 .  furosemide (LASIX) injection 20 mg, 20 mg, Intravenous, Q12H, Vida Rigger, MD, 20 mg at 04/17/19 2359 .  guaiFENesin (ROBITUSSIN) 100 MG/5ML solution 100 mg, 5 mL, Oral, Q4H PRN, Ogan, Okoronkwo U, MD, 100 mg at 04/17/19 1827 .  heparin ADULT infusion 100 units/mL (25000 units/285mL sodium chloride 0.45%), 1,300 Units/hr, Intravenous, Continuous, Tressie Ellis, RPH, Last Rate: 13 mL/hr at 04/18/19 0900, 1,300 Units/hr at 04/18/19 0900 .  insulin aspart (novoLOG) injection 0-20 Units, 0-20 Units, Subcutaneous, Q4H, Vida Rigger, MD, 4 Units at 04/18/19 0950 .  insulin aspart (novoLOG) injection 0-5 Units, 0-5 Units, Subcutaneous, QHS, Eugenie Norrie, NP, 3 Units at 04/12/19 2101 .  insulin aspart (novoLOG) injection 6 Units, 6 Units, Subcutaneous, TID WC,  Danford, Earl Lites, MD, 6 Units at 04/18/19 [phone] .  insulin glargine (LANTUS) injection 65 Units, 65 Units, Subcutaneous, Daily, Danford, Earl Lites, MD, 65 Units at 04/18/19 0950 .  levothyroxine (SYNTHROID) tablet 88 mcg, 88 mcg, Oral, QAC breakfast, Lorretta Harp, MD, 88 mcg at 04/18/19 0944 .  linagliptin (TRADJENTA) tablet 5 mg, 5 mg, Oral, Daily, Danford, Earl Lites, MD, 5 mg at 04/18/19 0947 .  lip balm (BLISTEX) ointment, , Topical, PRN, Harlon Ditty D, NP .  magic mouthwash, 15 mL, Oral, TID PRN, Salena Saner, MD, 15 mL at 04/10/19 0534 .  MEDLINE mouth rinse, 15 mL, Mouth Rinse, BID, Eugenie Norrie, NP, 15 mL at 04/18/19 0951 .  methylPREDNISolone sodium succinate (SOLU-MEDROL) 40 mg/mL injection 40  mg, 40 mg, Intravenous, Q12H, Awilda Bill, NP, 40 mg at 04/18/19 0950 .  mupirocin ointment (BACTROBAN) 2 %, , Nasal, BID, Tyler Pita, MD, Given at 04/17/19 2128 .  ondansetron (ZOFRAN) injection 4 mg, 4 mg, Intravenous, Q8H PRN, Ivor Costa, MD, 4 mg at 04/08/19 1104 .  oxyCODONE (Oxy IR/ROXICODONE) immediate release tablet 5 mg, 5 mg, Oral, Q6H PRN, Ivor Costa, MD, 5 mg at 04/12/19 2101 .  polyethylene glycol (MIRALAX / GLYCOLAX) packet 17 g, 17 g, Oral, Daily PRN, Darel Hong D, NP .  pramipexole (MIRAPEX) tablet 0.125 mg, 0.125 mg, Oral, BID, Ivor Costa, MD, 0.125 mg at 04/18/19 0947 .  pregabalin (LYRICA) capsule 150 mg, 150 mg, Oral, TID, Ivor Costa, MD, 150 mg at 04/18/19 0943 .  sodium chloride flush (NS) 0.9 % injection 10-40 mL, 10-40 mL, Intracatheter, Q12H, Kasa, Kurian, MD, 10 mL at 04/18/19 0951 .  sodium chloride flush (NS) 0.9 % injection 10-40 mL, 10-40 mL, Intracatheter, PRN, Mortimer Fries, Kurian, MD .  zinc sulfate capsule 220 mg, 220 mg, Oral, Daily, Ivor Costa, MD, 220 mg at 04/18/19 0948 .  zolpidem (AMBIEN) tablet 5 mg, 5 mg, Oral, QHS, Ivor Costa, MD, 5 mg at 04/17/19 2127    ALLERGIES   Cephalexin, Codeine, Doxycycline, Propoxyphene,  Sulfa antibiotics, Lovenox [enoxaparin sodium], Hydrocodone, and Meropenem    REVIEW OF SYSTEMS    Patient gives thumbs up for negatives and is negative except as per subjective findings  PHYSICAL EXAMINATION   Vital Signs: Temp:  [97.7 F (36.5 C)-98.8 F (37.1 C)] 97.7 F (36.5 C) (12/30 0800) Pulse Rate:  [39-163] 163 (12/30 1000) Resp:  [13-31] 23 (12/30 1000) BP: (85-126)/(50-97) 119/97 (12/30 1000) SpO2:  [81 %-93 %] 91 % (12/30 1000) FiO2 (%):  [75 %-92 %] 75 % (12/30 0800)  COVID-19 DISASTER DECLARATION:   FULL CONTACT PHYSICAL EXAMINATION WAS NOT POSSIBLE DUE TO TREATMENT OF COVID-19  AND CONSERVATION OF PERSONAL PROTECTIVE EQUIPMENT, LIMITED EXAM FINDINGS INCLUDE-  Physical Exam  Constitutional: She appears well-developed. No distress.  HENT:  Head: Normocephalic and atraumatic.  Eyes: EOM are normal. Left eye exhibits no discharge.  Neck: No tracheal deviation present.  Cardiovascular: Normal rate and regular rhythm.  Pulmonary/Chest: Effort normal. No respiratory distress.  Abdominal: There is no guarding.  Musculoskeletal:        General: No edema.     Cervical back: Normal range of motion.  Neurological: She is alert.  Skin: No rash noted. She is not diaphoretic. No erythema. There is pallor.     Patient assessed or the symptoms described in the history of present illness.  In the context of the Global COVID-19 pandemic, which necessitated consideration that the patient might be at risk for infection with the SARS-CoV-2 virus that causes COVID-19, Institutional protocols and algorithms that pertain to the evaluation of patients at risk for COVID-19 are in a state of rapid change based on information released by regulatory bodies including the CDC and federal and state organizations. These policies and algorithms were followed during the patient's care while in hospital.   PERTINENT DATA     Infusions: . heparin 1,300 Units/hr (04/18/19 0900)    Scheduled Medications: . vitamin C  500 mg Oral Daily  . atorvastatin  80 mg Oral q1800  . busPIRone  10 mg Oral BID  . calcium-vitamin D  1 tablet Oral Daily  . Chlorhexidine Gluconate Cloth  6 each Topical Daily  . cholecalciferol  1,000 Units Oral Q  breakfast  . ciclesonide  4 puff Inhalation BID  . colchicine  0.6 mg Oral Daily  . darifenacin  7.5 mg Oral Daily  . famotidine  40 mg Oral Daily  . FLUoxetine  20 mg Oral Daily  . folic acid  1 mg Oral Daily  . furosemide  20 mg Intravenous Q12H  . insulin aspart  0-20 Units Subcutaneous Q4H  . insulin aspart  0-5 Units Subcutaneous QHS  . insulin aspart  6 Units Subcutaneous TID WC  . insulin glargine  65 Units Subcutaneous Daily  . levothyroxine  88 mcg Oral QAC breakfast  . linagliptin  5 mg Oral Daily  . mouth rinse  15 mL Mouth Rinse BID  . methylPREDNISolone (SOLU-MEDROL) injection  40 mg Intravenous Q12H  . mupirocin ointment   Nasal BID  . pramipexole  0.125 mg Oral BID  . pregabalin  150 mg Oral TID  . sodium chloride flush  10-40 mL Intracatheter Q12H  . zinc sulfate  220 mg Oral Daily  . zolpidem  5 mg Oral QHS   PRN Medications: acetaminophen, albuterol, ALPRAZolam, guaiFENesin, lip balm, magic mouthwash, ondansetron (ZOFRAN) IV, oxyCODONE, polyethylene glycol, sodium chloride flush Hemodynamic parameters:   Intake/Output: 12/29 0701 - 12/30 0700 In: 334.2 [I.V.:334.2] Out: 1775 [Urine:1775]  Ventilator  Settings: FiO2 (%):  [75 %-92 %] 75 %    LAB RESULTS:  Basic Metabolic Panel: Recent Labs  Lab 04/12/19 0434 04/14/19 0629 04/15/19 0421 04/16/19 0354 04/17/19 0620 04/18/19 0433  NA 139 136 137 135 133* 136  K 4.1 3.9 4.2 4.5 4.2 4.6  CL 108 103 102 101 99 98  CO2 22 25 23 23 23 25   GLUCOSE 220* 143* 200* 300* 170* 189*  BUN 37* 33* 33* 39* 43* 54*  CREATININE 1.00 0.90 0.91 1.08* 1.06* 1.37*  CALCIUM 8.6* 8.8* 9.1 8.7* 9.1 9.4  MG 2.5*  --   --   --   --   --   PHOS 4.3  --   --   --    --   --    Liver Function Tests: Recent Labs  Lab 04/12/19 0434  AST 14*  ALT 16  ALKPHOS 49  BILITOT 0.7  PROT 7.3  ALBUMIN 2.3*   No results for input(s): LIPASE, AMYLASE in the last 168 hours. No results for input(s): AMMONIA in the last 168 hours. CBC: Recent Labs  Lab 04/12/19 0434 04/14/19 0630 04/15/19 0421 04/16/19 0354 04/17/19 0620 04/18/19 0433  WBC 6.8 8.2 13.0* 13.4* 13.5* 19.3*  NEUTROABS 5.9  --   --   --   --   --   HGB 10.0* 10.7* 11.7* 11.6* 12.4 12.4  HCT 30.0* 33.0* 35.4* 37.0 39.4 39.8  MCV 85.2 78.4* 78.3* 82.0 82.3 82.1  PLT 297 273 322 284 288 302   Cardiac Enzymes: No results for input(s): CKTOTAL, CKMB, CKMBINDEX, TROPONINI in the last 168 hours. BNP: Invalid input(s): POCBNP CBG: Recent Labs  Lab 04/17/19 1554 04/17/19 1945 04/17/19 2351 04/18/19 0400 04/18/19 0749  GLUCAP 188* 207* 110* 165* 183*     IMAGING RESULTS:  Imaging: No results found.     ASSESSMENT AND PLAN    -Multidisciplinary rounds held today  Acute Hypoxic Respiratory Failure -due to post covid pneumonitis - currently on solumedrol 40 bid  -will obtain fungitell  -continue Bronchodilator Therapy  Chronic diastolic CHF -oxygen as needed -Lasix as tolerated -follow up cardiac enzymes as indicated ICU monitoring GI/Nutrition GI PROPHYLAXIS as indicated  DIET-->TF's as tolerated Constipation protocol as indicated  ENDO - ICU hypoglycemic\Hyperglycemia protocol -check FSBS per protocol   ELECTROLYTES -follow labs as needed -replace as needed -pharmacy consultation   DVT/GI PRX ordered -SCDs  TRANSFUSIONS AS NEEDED MONITOR FSBS ASSESS the need for LABS as needed   Critical care provider statement:    Critical care time (minutes):  33   Critical care time was exclusive of:  Separately billable procedures and treating other patients   Critical care was necessary to treat or prevent imminent or life-threatening deterioration of the  following conditions:  Acute hypoxemic respiratory failure, multiple comorbid conditions   Critical care was time spent personally by me on the following activities:  Development of treatment plan with patient or surrogate, discussions with consultants, evaluation of patient's response to treatment, examination of patient, obtaining history from patient or surrogate, ordering and performing treatments and interventions, ordering and review of laboratory studies and re-evaluation of patient's condition.  I assumed direction of critical care for this patient from another provider in my specialty: no    This document was prepared using Dragon voice recognition software and may include unintentional dictation errors.    Vida RiggerFuad Altagracia Rone, M.D.  Division of Pulmonary & Critical Care Medicine  [facility]

## 2019-04-18 NOTE — Consult Note (Signed)
ANTICOAGULATION CONSULT NOTE  Pharmacy Consult for Heparin Infusion Indication: Hypercoagable state 2/2 COVID disease  Allergies  Allergen Reactions  . Cephalexin Hives  . Codeine Palpitations, Nausea Only, Nausea And Vomiting, Rash and Shortness Of Breath    "makes heart fly, she gets flushed and passes out"  . Doxycycline Rash  . Propoxyphene Rash and Shortness Of Breath    Increase heart rate  . Sulfa Antibiotics Palpitations, Nausea Only, Shortness Of Breath and Hives    "makes heart fly, she gets flushed and passes out"  . Lovenox [Enoxaparin Sodium] Hives  . Hydrocodone Nausea And Vomiting    Hear racing & breaks out into a cold sweat.  . Meropenem Rash    Erythematous, hot, pruritic rash over arms, chest, back, abdomen, and face occurred at the end of meropenem infusion on 02/22/18   Patient Measurements: Height: 5\' 9"  (175.3 cm) Weight: (!) 320 lb (145.2 kg) IBW/kg (Calculated) : 66.2 Heparin Dosing Weight: 95.4  Vital Signs: Temp: 98 F (36.7 C) (12/30 0400) Temp Source: Oral (12/30 0400) BP: 116/80 (12/30 0400) Pulse Rate: 87 (12/30 0400)  Labs: Recent Labs    04/16/19 0354 04/17/19 0620 04/17/19 1452 04/17/19 2147 04/18/19 0433  HGB 11.6* 12.4  --   --  12.4  HCT 37.0 39.4  --   --  39.8  PLT 284 288  --   --  302  HEPARINUNFRC 0.59 0.92* 0.85* 0.41 0.33  CREATININE 1.08* 1.06*  --   --  1.37*    Estimated Creatinine Clearance: 69.9 mL/min (A) (by C-G formula based on SCr of 1.37 mg/dL (H)).   Medical History: Past Medical History:  Diagnosis Date  . Abdominal wall hernia 01/29/2013  . Anxiety   . Arthritis    Rheumatoid  . C. difficile colitis   . Chronic diastolic heart failure ([place])   . Depression   . Diabetes mellitus    states no meds or diet restrictions  at present  . Diastolic CHF ([place])   . Esophagitis   . Fluid retention   . GERD (gastroesophageal reflux disease)   . Hiatal hernia   . Hypertension   . Hypokalemia due to loss of  potassium 10/21/2015   Overview:  Associated with 3 weeks of diarrhea  And QT prolongation.  . Hypothyroidism   . IBS (irritable bowel syndrome)   . Moderate episode of recurrent major depressive disorder ([place]) 06/03/2004  . Morbid obesity ([place])   . MRSA (methicillin resistant Staphylococcus aureus) infection 11/2017   left inner thigh abcess  . Neurogenic bladder    has pacemaker  . Neuropathy   . Obesity   . Panic attacks   . Rheumatoid arthritis ([place])   . Sleep apnea    STATES SEVERE, CANT TOLERATE MASK- LAST STUDY YEARS AGO    Medications:  No anticoagulation prior to admission per medication reconcilation  Assessment: 58 y/o F with history of anxiety, diabetes, gastric reflux, hypertension, CHF, obesity who presented to [facility] ED 12/18 with shortness of breath. Previous COVID diagnosis for which she was admitted to [facility] 12/8-12/12. D dimer elevated to 1276.  On 12/23, patient bled extensively through left arm IV site, and therapeutic heparin was placed on hold. No PE noted on 12/18 Chest CT.    Pharmacy has been consulted to initiate heparin drip for hypercoagulable state secondary to COVID disease.  Heparin previously therapeutic for several days on previous rate of 1700 units/hr. As of 12/29 AM level returned supratherapeutic and rate was  decreased to 1500 units/hr. Confirmed with RN that heparin is being drawn appropriately by lab.   Goal of Therapy:  Heparin level 0.3-0.7 units/ml Monitor platelets by anticoagulation protocol: Yes   Plan:  12/30 @ 0430 HL 0.33 therapeutic, but trending down. Will continue current rate and will recheck another HL at 1000 to ensure it doesn't drop below therapeutic. CBC stable will continue to monitor.  Thomasene Ripple, PharmD, BCPS Clinical Pharmacist 04/18/2019 6:09 AM

## 2019-04-19 LAB — FUNGITELL, SERUM: Fungitell Result: 32 pg/mL (ref <80)

## 2019-04-19 LAB — GLUCOSE, CAPILLARY
Glucose-Capillary: 142 mg/dL — ABNORMAL HIGH (ref 70–99)
Glucose-Capillary: 164 mg/dL — ABNORMAL HIGH (ref 70–99)
Glucose-Capillary: 185 mg/dL — ABNORMAL HIGH (ref 70–99)
Glucose-Capillary: 247 mg/dL — ABNORMAL HIGH (ref 70–99)
Glucose-Capillary: 359 mg/dL — ABNORMAL HIGH (ref 70–99)
Glucose-Capillary: 412 mg/dL — ABNORMAL HIGH (ref 70–99)

## 2019-04-19 LAB — TROPONIN I (HIGH SENSITIVITY)
Troponin I (High Sensitivity): 5 ng/L (ref <18)
Troponin I (High Sensitivity): 5 ng/L (ref <18)

## 2019-04-19 MED ORDER — INSULIN ASPART 100 UNIT/ML ~~LOC~~ SOLN
5.0000 [IU] | Freq: Once | SUBCUTANEOUS | Status: AC
  Administered 2019-04-19: 5 [IU] via SUBCUTANEOUS
  Filled 2019-04-19: qty 1

## 2019-04-19 MED ORDER — ENSURE MAX PROTEIN PO LIQD
11.0000 [oz_av] | Freq: Two times a day (BID) | ORAL | Status: DC
  Administered 2019-04-20 – 2019-05-04 (×14): 11 [oz_av] via ORAL
  Filled 2019-04-19: qty 330

## 2019-04-19 MED ORDER — INSULIN GLARGINE 100 UNIT/ML ~~LOC~~ SOLN
3.0000 [IU] | Freq: Once | SUBCUTANEOUS | Status: AC
  Administered 2019-04-19: 3 [IU] via SUBCUTANEOUS
  Filled 2019-04-19: qty 0.03

## 2019-04-19 NOTE — Progress Notes (Signed)
CRITICAL CARE PROGRESS NOTE    Name: [name] [name] MRN: [id number] DOB: 1960/12/12     LOS: 13   SUBJECTIVE FINDINGS & SIGNIFICANT EVENTS   Patient description:  [name] [name] an 58 y.o.femaleformer smoker with a history of diabetes mellitus, morbid obesity and rheumatoid arthritis diagnosed with COVID-19 on December 4. Admitted to [facility] campus from 8 December through 12 December received 5 days of remdesivir. Also received dexamethasone. She has been found with ARDS   Lines / Drains: PIVx2  Cultures / Sepsis markers: Blood culture X1 positive for staph epi with resistance - possibly contaminant  Antibiotics: None    Protocols / Consultants: Hospitalist/pccm   Tests / Events: None   Overnight: 12/28-Weaned down to  75%FiO2, mentation improving.  -12/29- clinically imporved, diet advanced, heparin decreased per PTT 12/30 -HFNC75%, self proning 12/31 - continue to wean O2,monitor vitals, patient hypotenisve this am   PAST MEDICAL HISTORY   Past Medical History:  Diagnosis Date  . Abdominal wall hernia 01/29/2013  . Anxiety   . Arthritis    Rheumatoid  . C. difficile colitis   . Chronic diastolic heart failure (HCC)   . Depression   . Diabetes mellitus    states no meds or diet restrictions  at present  . Diastolic CHF (HCC)   . Esophagitis   . Fluid retention   . GERD (gastroesophageal reflux disease)   . Hiatal hernia   . Hypertension   . Hypokalemia due to loss of potassium 10/21/2015   Overview:  Associated with 3 weeks of diarrhea  And QT prolongation.  . Hypothyroidism   . IBS (irritable bowel syndrome)   . Moderate episode of recurrent major depressive disorder (HCC) 06/03/2004  . Morbid obesity (HCC)   . MRSA (methicillin resistant Staphylococcus aureus)  infection 11/2017   left inner thigh abcess  . Neurogenic bladder    has pacemaker  . Neuropathy   . Obesity   . Panic attacks   . Rheumatoid arthritis (HCC)   . Sleep apnea    STATES SEVERE, CANT TOLERATE MASK- LAST STUDY YEARS AGO     SURGICAL HISTORY   Past Surgical History:  Procedure Laterality Date  . ABDOMINAL HYSTERECTOMY    . CHOLECYSTECTOMY    . DG GREAT TOE RIGHT FOOT  02/23/2018  . EYE SURGERY     bilateral cataract extraction with IOL  . HERNIA REPAIR     ventral hernia with strangulation  . LAPAROSCOPIC GASTRIC BANDING  03/20/07  . TONSILLECTOMY    . TUBAL LIGATION       FAMILY HISTORY   Family History  Problem Relation Age of Onset  . Heart failure Father   . Bipolar disorder Father   . Alcohol abuse Father   . Anxiety disorder Father   . Depression Father   . Heart disease Brother   . Heart attack Brother 109       MI s/p stents placed  . Anxiety disorder Sister   . Depression Sister   . Anxiety disorder Sister   . Depression Sister   . Bipolar disorder Sister   . Alcohol abuse Sister   . Drug abuse Sister   . Heart attack Brother      SOCIAL HISTORY   Social History   Tobacco Use  . Smoking status: Former Smoker    Packs/day: 2.00    Years: 27.00    Pack years: 54.00    Types: Cigarettes    Quit date:  07/30/1999    Years since quitting: 19.7  . Smokeless tobacco: Never Used  Substance Use Topics  . Alcohol use: No  . Drug use: No     MEDICATIONS   Current Medication:  Current Facility-Administered Medications:  .  acetaminophen (TYLENOL) tablet 650 mg, 650 mg, Oral, Q6H PRN, Lorretta HarpNiu, Xilin, MD, 650 mg at 04/10/19 0224 .  albuterol (VENTOLIN HFA) 108 (90 Base) MCG/ACT inhaler 2 puff, 2 puff, Inhalation, Q4H PRN, Lorretta HarpNiu, Xilin, MD .  ALPRAZolam Prudy Feeler(XANAX) tablet 0.5 mg, 0.5 mg, Oral, TID PRN, Erin FullingKasa, Kurian, MD, 0.5 mg at 04/17/19 2127 .  ascorbic acid (VITAMIN C) tablet 500 mg, 500 mg, Oral, Daily, Lorretta HarpNiu, Xilin, MD, 500 mg at 04/19/19  0957 .  atorvastatin (LIPITOR) tablet 80 mg, 80 mg, Oral, q1800, Salena SanerGonzalez, Carmen L, MD, 80 mg at 04/18/19 2256 .  busPIRone (BUSPAR) tablet 10 mg, 10 mg, Oral, BID, Lorretta HarpNiu, Xilin, MD, 10 mg at 04/19/19 [id number] .  calcium-vitamin D (OSCAL WITH D) 500-200 MG-UNIT per tablet 1 tablet, 1 tablet, Oral, Daily, Lorretta HarpNiu, Xilin, MD, 1 tablet at 04/19/19 [phone] .  Chlorhexidine Gluconate Cloth 2 % PADS 6 each, 6 each, Topical, Daily, Danford, Earl Liteshristopher P, MD, 6 each at 04/18/19 [id number] .  cholecalciferol (VITAMIN D3) tablet 1,000 Units, 1,000 Units, Oral, Q breakfast, Lorretta HarpNiu, Xilin, MD, 1,000 Units at 04/19/19 1000 .  ciclesonide (ALVESCO) 160 MCG/ACT inhaler 4 puff, 4 puff, Inhalation, BID, Salena SanerGonzalez, Carmen L, MD, 4 puff at 04/19/19 1000 .  colchicine tablet 0.6 mg, 0.6 mg, Oral, Daily, Salena SanerGonzalez, Carmen L, MD, 0.6 mg at 04/19/19 0953 .  darifenacin (ENABLEX) 24 hr tablet 7.5 mg, 7.5 mg, Oral, Daily, Lorretta HarpNiu, Xilin, MD, 7.5 mg at 04/19/19 [id number] .  enoxaparin (LOVENOX) injection 75 mg, 0.5 mg/kg, Subcutaneous, Q12H, Vida RiggerAleskerov, Mardy Hoppe, MD, 75 mg at 04/19/19 0009 .  famotidine (PEPCID) tablet 40 mg, 40 mg, Oral, Daily, Salena SanerGonzalez, Carmen L, MD, 40 mg at 04/19/19 0951 .  FLUoxetine (PROZAC) capsule 20 mg, 20 mg, Oral, Daily, Lorretta HarpNiu, Xilin, MD, 20 mg at 04/19/19 0958 .  folic acid (FOLVITE) tablet 1 mg, 1 mg, Oral, Daily, Lorretta HarpNiu, Xilin, MD, 1 mg at 04/19/19 1004 .  furosemide (LASIX) injection 40 mg, 40 mg, Intravenous, Q12H, Vida RiggerAleskerov, Lanitra Battaglini, MD, 40 mg at 04/19/19 0946 .  guaiFENesin (ROBITUSSIN) 100 MG/5ML solution 100 mg, 5 mL, Oral, Q4H PRN, Migdalia Dkgan, [place] U, MD, 100 mg at 04/19/19 0942 .  insulin aspart (novoLOG) injection 0-20 Units, 0-20 Units, Subcutaneous, Q4H, Vida RiggerAleskerov, Judy Pollman, MD, 4 Units at 04/19/19 0855 .  insulin aspart (novoLOG) injection 0-5 Units, 0-5 Units, Subcutaneous, QHS, Eugenie NorrieBlakeney, [name] G, NP, 4 Units at 04/18/19 2257 .  insulin aspart (novoLOG) injection 6 Units, 6 Units, Subcutaneous, TID WC, Danford, Earl Liteshristopher P, MD,  6 Units at 04/19/19 [phone] .  insulin glargine (LANTUS) injection 65 Units, 65 Units, Subcutaneous, Daily, Danford, Earl Liteshristopher P, MD, 65 Units at 04/19/19 0944 .  levothyroxine (SYNTHROID) tablet 88 mcg, 88 mcg, Oral, QAC breakfast, Lorretta HarpNiu, Xilin, MD, 88 mcg at 04/19/19 0900 .  linagliptin (TRADJENTA) tablet 5 mg, 5 mg, Oral, Daily, Danford, Earl Liteshristopher P, MD, 5 mg at 04/19/19 0956 .  lip balm (BLISTEX) ointment, , Topical, PRN, Harlon DittyKeene, Jeremiah D, NP .  magic mouthwash, 15 mL, Oral, TID PRN, Salena SanerGonzalez, Carmen L, MD, 15 mL at 04/10/19 0534 .  MEDLINE mouth rinse, 15 mL, Mouth Rinse, BID, Eugenie NorrieBlakeney, [name] G, NP, 15 mL at 04/18/19 2301 .  methylPREDNISolone sodium succinate (SOLU-MEDROL) 40 mg/mL injection 40 mg,  40 mg, Intravenous, Q12H, Awilda Bill, NP, 40 mg at 04/19/19 0946 .  mupirocin ointment (BACTROBAN) 2 %, , Nasal, BID, Tyler Pita, MD, Given at 04/18/19 2300 .  ondansetron (ZOFRAN) injection 4 mg, 4 mg, Intravenous, Q8H PRN, Ivor Costa, MD, 4 mg at 04/08/19 1104 .  oxyCODONE (Oxy IR/ROXICODONE) immediate release tablet 5 mg, 5 mg, Oral, Q6H PRN, Ivor Costa, MD, 5 mg at 04/19/19 0956 .  polyethylene glycol (MIRALAX / GLYCOLAX) packet 17 g, 17 g, Oral, Daily PRN, Darel Hong D, NP .  pramipexole (MIRAPEX) tablet 0.125 mg, 0.125 mg, Oral, BID, Ivor Costa, MD, 0.125 mg at 04/19/19 0957 .  pregabalin (LYRICA) capsule 150 mg, 150 mg, Oral, TID, Ivor Costa, MD, 150 mg at 04/19/19 1003 .  sodium chloride flush (NS) 0.9 % injection 10-40 mL, 10-40 mL, Intracatheter, Q12H, Kasa, Kurian, MD, 10 mL at 04/19/19 1003 .  sodium chloride flush (NS) 0.9 % injection 10-40 mL, 10-40 mL, Intracatheter, PRN, Flora Lipps, MD .  zinc sulfate capsule 220 mg, 220 mg, Oral, Daily, Ivor Costa, MD, 220 mg at 04/19/19 1003 .  zolpidem (AMBIEN) tablet 5 mg, 5 mg, Oral, QHS, Ivor Costa, MD, 5 mg at 04/18/19 2255    ALLERGIES   Cephalexin, Codeine, Doxycycline, Propoxyphene, Sulfa antibiotics, Lovenox  [enoxaparin sodium], Hydrocodone, and Meropenem    REVIEW OF SYSTEMS    Patient gives thumbs up for negatives and is negative except as per subjective findings  PHYSICAL EXAMINATION   Vital Signs: Temp:  [97.6 F (36.4 C)-98.5 F (36.9 C)] 97.9 F (36.6 C) (12/31 0400) Pulse Rate:  [80-136] 80 (12/31 0500) Resp:  [17-29] 20 (12/31 0500) BP: (93-140)/(62-88) 117/82 (12/31 0500) SpO2:  [89 %-95 %] 94 % (12/31 0500) FiO2 (%):  [75 %-78 %] 75 % (12/31 0400)  COVID-19 DISASTER DECLARATION:   FULL CONTACT PHYSICAL EXAMINATION WAS NOT POSSIBLE DUE TO TREATMENT OF COVID-19  AND CONSERVATION OF PERSONAL PROTECTIVE EQUIPMENT, LIMITED EXAM FINDINGS INCLUDE-  Physical Exam  Constitutional: She appears well-developed. No distress.  HENT:  Head: Normocephalic and atraumatic.  Eyes: EOM are normal. Left eye exhibits no discharge.  Neck: No tracheal deviation present.  Cardiovascular: Normal rate and regular rhythm.  Pulmonary/Chest: Effort normal. No respiratory distress.  Abdominal: There is no guarding.  Musculoskeletal:        General: No edema.     Cervical back: Normal range of motion.  Neurological: She is alert.  Skin: No rash noted. She is not diaphoretic. No erythema. There is pallor.     Patient assessed or the symptoms described in the history of present illness.  In the context of the Global COVID-19 pandemic, which necessitated consideration that the patient might be at risk for infection with the SARS-CoV-2 virus that causes COVID-19, Institutional protocols and algorithms that pertain to the evaluation of patients at risk for COVID-19 are in a state of rapid change based on information released by regulatory bodies including the CDC and federal and state organizations. These policies and algorithms were followed during the patient's care while in hospital.   PERTINENT DATA     Infusions:  Scheduled Medications: . vitamin C  500 mg Oral Daily  . atorvastatin  80  mg Oral q1800  . busPIRone  10 mg Oral BID  . calcium-vitamin D  1 tablet Oral Daily  . Chlorhexidine Gluconate Cloth  6 each Topical Daily  . cholecalciferol  1,000 Units Oral Q breakfast  . ciclesonide  4 puff Inhalation  BID  . colchicine  0.6 mg Oral Daily  . darifenacin  7.5 mg Oral Daily  . enoxaparin (LOVENOX) injection  0.5 mg/kg Subcutaneous Q12H  . famotidine  40 mg Oral Daily  . FLUoxetine  20 mg Oral Daily  . folic acid  1 mg Oral Daily  . furosemide  40 mg Intravenous Q12H  . insulin aspart  0-20 Units Subcutaneous Q4H  . insulin aspart  0-5 Units Subcutaneous QHS  . insulin aspart  6 Units Subcutaneous TID WC  . insulin glargine  65 Units Subcutaneous Daily  . levothyroxine  88 mcg Oral QAC breakfast  . linagliptin  5 mg Oral Daily  . mouth rinse  15 mL Mouth Rinse BID  . methylPREDNISolone (SOLU-MEDROL) injection  40 mg Intravenous Q12H  . mupirocin ointment   Nasal BID  . pramipexole  0.125 mg Oral BID  . pregabalin  150 mg Oral TID  . sodium chloride flush  10-40 mL Intracatheter Q12H  . zinc sulfate  220 mg Oral Daily  . zolpidem  5 mg Oral QHS   PRN Medications: acetaminophen, albuterol, ALPRAZolam, guaiFENesin, lip balm, magic mouthwash, ondansetron (ZOFRAN) IV, oxyCODONE, polyethylene glycol, sodium chloride flush Hemodynamic parameters:   Intake/Output: 12/30 0701 - 12/31 0700 In: 52.1 [I.V.:52.1] Out: 1550 [Urine:1550]  Ventilator  Settings: FiO2 (%):  [75 %-78 %] 75 %    LAB RESULTS:  Basic Metabolic Panel: Recent Labs  Lab 04/14/19 0629 04/15/19 0421 04/16/19 0354 04/17/19 0620 04/18/19 0433  NA 136 137 135 133* 136  K 3.9 4.2 4.5 4.2 4.6  CL 103 102 101 99 98  CO2 25 23 23 23 25   GLUCOSE 143* 200* 300* 170* 189*  BUN 33* 33* 39* 43* 54*  CREATININE 0.90 0.91 1.08* 1.06* 1.37*  CALCIUM 8.8* 9.1 8.7* 9.1 9.4   Liver Function Tests: No results for input(s): AST, ALT, ALKPHOS, BILITOT, PROT, ALBUMIN in the last 168 hours. No results  for input(s): LIPASE, AMYLASE in the last 168 hours. No results for input(s): AMMONIA in the last 168 hours. CBC: Recent Labs  Lab 04/14/19 0630 04/15/19 0421 04/16/19 0354 04/17/19 0620 04/18/19 0433  WBC 8.2 13.0* 13.4* 13.5* 19.3*  HGB 10.7* 11.7* 11.6* 12.4 12.4  HCT 33.0* 35.4* 37.0 39.4 39.8  MCV 78.4* 78.3* 82.0 82.3 82.1  PLT 273 322 284 288 302   Cardiac Enzymes: No results for input(s): CKTOTAL, CKMB, CKMBINDEX, TROPONINI in the last 168 hours. BNP: Invalid input(s): POCBNP CBG: Recent Labs  Lab 04/18/19 1147 04/18/19 1611 04/18/19 2031 04/19/19 0007 04/19/19 0314  GLUCAP 203* 376* 344* 142* 164*     IMAGING RESULTS:  Imaging: No results found.     ASSESSMENT AND PLAN    -Multidisciplinary rounds held today  Acute Hypoxic Respiratory Failure -due to post covid pneumonitis - currently on solumedrol 40 bid  -will obtain fungitell  -continue Bronchodilator Therapy  Chronic diastolic CHF -oxygen as needed -Lasix as tolerated -follow up cardiac enzymes as indicated ICU monitoring GI/Nutrition GI PROPHYLAXIS as indicated DIET-->TF's as tolerated Constipation protocol as indicated  ENDO - ICU hypoglycemic\Hyperglycemia protocol -check FSBS per protocol   ELECTROLYTES -follow labs as needed -replace as needed -pharmacy consultation   DVT/GI PRX ordered -SCDs  TRANSFUSIONS AS NEEDED MONITOR FSBS ASSESS the need for LABS as needed   Critical care provider statement:    Critical care time (minutes):  33   Critical care time was exclusive of:  Separately billable procedures and treating other patients  Critical care was necessary to treat or prevent imminent or life-threatening deterioration of the following conditions:  Acute hypoxemic respiratory failure, multiple comorbid conditions   Critical care was time spent personally by me on the following activities:  Development of treatment plan with patient or surrogate, discussions with  consultants, evaluation of patient's response to treatment, examination of patient, obtaining history from patient or surrogate, ordering and performing treatments and interventions, ordering and review of laboratory studies and re-evaluation of patient's condition.  I assumed direction of critical care for this patient from another provider in my specialty: no    This document was prepared using Dragon voice recognition software and may include unintentional dictation errors.    Vida RiggerFuad Weslie Pretlow, M.D.  Division of Pulmonary & Critical Care Medicine  [facility]

## 2019-04-20 ENCOUNTER — Inpatient Hospital Stay: Payer: Medicaid Other

## 2019-04-20 LAB — BASIC METABOLIC PANEL
Anion gap: 12 (ref 5–15)
BUN: 67 mg/dL — ABNORMAL HIGH (ref 6–20)
CO2: 23 mmol/L (ref 22–32)
Calcium: 9.1 mg/dL (ref 8.9–10.3)
Chloride: 96 mmol/L — ABNORMAL LOW (ref 98–111)
Creatinine, Ser: 1.27 mg/dL — ABNORMAL HIGH (ref 0.44–1.00)
GFR calc Af Amer: 54 mL/min — ABNORMAL LOW (ref >60)
GFR calc non Af Amer: 47 mL/min — ABNORMAL LOW (ref >60)
Glucose, Bld: 273 mg/dL — ABNORMAL HIGH (ref 70–99)
Potassium: 4.2 mmol/L (ref 3.5–5.1)
Sodium: 131 mmol/L — ABNORMAL LOW (ref 135–145)

## 2019-04-20 LAB — CBC
HCT: 38.8 % (ref 36.0–46.0)
Hemoglobin: 12.2 g/dL (ref 12.0–15.0)
MCH: 25.9 pg — ABNORMAL LOW (ref 26.0–34.0)
MCHC: 31.4 g/dL (ref 30.0–36.0)
MCV: 82.4 fL (ref 80.0–100.0)
Platelets: 271 10*3/uL (ref 150–400)
RBC: 4.71 MIL/uL (ref 3.87–5.11)
RDW: 16.7 % — ABNORMAL HIGH (ref 11.5–15.5)
WBC: 16.9 10*3/uL — ABNORMAL HIGH (ref 4.0–10.5)
nRBC: 0 % (ref 0.0–0.2)

## 2019-04-20 LAB — GLUCOSE, CAPILLARY
Glucose-Capillary: 217 mg/dL — ABNORMAL HIGH (ref 70–99)
Glucose-Capillary: 249 mg/dL — ABNORMAL HIGH (ref 70–99)
Glucose-Capillary: 250 mg/dL — ABNORMAL HIGH (ref 70–99)
Glucose-Capillary: 251 mg/dL — ABNORMAL HIGH (ref 70–99)
Glucose-Capillary: 283 mg/dL — ABNORMAL HIGH (ref 70–99)
Glucose-Capillary: 314 mg/dL — ABNORMAL HIGH (ref 70–99)

## 2019-04-20 MED ORDER — INSULIN GLARGINE 100 UNIT/ML ~~LOC~~ SOLN
5.0000 [IU] | Freq: Every day | SUBCUTANEOUS | Status: DC
  Administered 2019-04-20: 5 [IU] via SUBCUTANEOUS
  Filled 2019-04-20 (×2): qty 0.05

## 2019-04-20 MED ORDER — INSULIN ASPART 100 UNIT/ML ~~LOC~~ SOLN
10.0000 [IU] | Freq: Three times a day (TID) | SUBCUTANEOUS | Status: DC
  Administered 2019-04-20 – 2019-04-21 (×2): 10 [IU] via SUBCUTANEOUS
  Filled 2019-04-20: qty 1

## 2019-04-20 NOTE — Progress Notes (Signed)
CRITICAL CARE PROGRESS NOTE    Name: [name] [name] MRN: [id number] DOB: 1960/06/22     LOS: 65   SUBJECTIVE FINDINGS & SIGNIFICANT EVENTS   Patient description:  [name] [name] an 59 y.o.femaleformer smoker with a history of diabetes mellitus, morbid obesity and rheumatoid arthritis diagnosed with COVID-19 on December 4. Admitted to [facility] from 8 December through 12 December received 5 days of remdesivir. Also received dexamethasone. She has been found with ARDS   Lines / Drains: PIVx2  Cultures / Sepsis markers: Blood culture X1 positive for staph epi with resistance - possibly contaminant  Antibiotics: None    Protocols / Consultants: Hospitalist/pccm   Tests / Events: None   Overnight: 12/28-Weaned down to  75%FiO2, mentation improving.  -12/29- clinically imporved, diet advanced, heparin decreased per PTT 12/30 -HFNC75%, self proning 12/31 - continue to wean O2,monitor vitals, patient hypotenisve this am  1/1 - patient smiling this am , eating breakfast, no distress, plan to continue weaning fio2 on high flow  PAST MEDICAL HISTORY   Past Medical History:  Diagnosis Date  . Abdominal wall hernia 01/29/2013  . Anxiety   . Arthritis    Rheumatoid  . C. difficile colitis   . Chronic diastolic heart failure ([place])   . Depression   . Diabetes mellitus    states no meds or diet restrictions  at present  . Diastolic CHF ([place])   . Esophagitis   . Fluid retention   . GERD (gastroesophageal reflux disease)   . Hiatal hernia   . Hypertension   . Hypokalemia due to loss of potassium 10/21/2015   Overview:  Associated with 3 weeks of diarrhea  And QT prolongation.  . Hypothyroidism   . IBS (irritable bowel syndrome)   . Moderate episode of recurrent major depressive  disorder ([place]) 06/03/2004  . Morbid obesity ([place])   . MRSA (methicillin resistant Staphylococcus aureus) infection 11/2017   left inner thigh abcess  . Neurogenic bladder    has pacemaker  . Neuropathy   . Obesity   . Panic attacks   . Rheumatoid arthritis ([place])   . Sleep apnea    STATES SEVERE, CANT TOLERATE MASK- LAST STUDY YEARS AGO     SURGICAL HISTORY   Past Surgical History:  Procedure Laterality Date  . ABDOMINAL HYSTERECTOMY    . CHOLECYSTECTOMY    . DG GREAT TOE RIGHT FOOT  02/23/2018  . EYE SURGERY     bilateral cataract extraction with IOL  . HERNIA REPAIR     ventral hernia with strangulation  . LAPAROSCOPIC GASTRIC BANDING  03/20/07  . TONSILLECTOMY    . TUBAL LIGATION       FAMILY HISTORY   Family History  Problem Relation Age of Onset  . Heart failure Father   . Bipolar disorder Father   . Alcohol abuse Father   . Anxiety disorder Father   . Depression Father   . Heart disease Brother   . Heart attack Brother 53       MI s/p stents placed  . Anxiety disorder Sister   . Depression Sister   . Anxiety disorder Sister   . Depression Sister   . Bipolar disorder Sister   . Alcohol abuse Sister   . Drug abuse Sister   . Heart attack Brother      SOCIAL HISTORY   Social History   Tobacco Use  . Smoking status: Former Smoker    Packs/day: 2.00  Years: 27.00    Pack years: 54.00    Types: Cigarettes    Quit date: 07/30/1999    Years since quitting: 19.7  . Smokeless tobacco: Never Used  Substance Use Topics  . Alcohol use: No  . Drug use: No     MEDICATIONS   Current Medication:  Current Facility-Administered Medications:  .  acetaminophen (TYLENOL) tablet 650 mg, 650 mg, Oral, Q6H PRN, Lorretta Harp, MD, 650 mg at 04/10/19 0224 .  albuterol (VENTOLIN HFA) 108 (90 Base) MCG/ACT inhaler 2 puff, 2 puff, Inhalation, Q4H PRN, Lorretta Harp, MD .  ALPRAZolam Prudy Feeler) tablet 0.5 mg, 0.5 mg, Oral, TID PRN, Erin Fulling, MD, 0.5 mg at 04/17/19  2127 .  ascorbic acid (VITAMIN C) tablet 500 mg, 500 mg, Oral, Daily, Lorretta Harp, MD, 500 mg at 04/20/19 0853 .  atorvastatin (LIPITOR) tablet 80 mg, 80 mg, Oral, q1800, Salena Saner, MD, 80 mg at 04/19/19 2115 .  busPIRone (BUSPAR) tablet 10 mg, 10 mg, Oral, BID, Lorretta Harp, MD, 10 mg at 04/20/19 0849 .  calcium-vitamin D (OSCAL WITH D) 500-200 MG-UNIT per tablet 1 tablet, 1 tablet, Oral, Daily, Lorretta Harp, MD, 1 tablet at 04/20/19 0848 .  Chlorhexidine Gluconate Cloth 2 % PADS 6 each, 6 each, Topical, Daily, Danford, Earl Lites, MD, 6 each at 04/20/19 [phone] .  cholecalciferol (VITAMIN D3) tablet 1,000 Units, 1,000 Units, Oral, Q breakfast, Lorretta Harp, MD, 1,000 Units at 04/20/19 0850 .  ciclesonide (ALVESCO) 160 MCG/ACT inhaler 4 puff, 4 puff, Inhalation, BID, Salena Saner, MD, 4 puff at 04/20/19 [phone] .  colchicine tablet 0.6 mg, 0.6 mg, Oral, Daily, Salena Saner, MD, 0.6 mg at 04/20/19 0846 .  darifenacin (ENABLEX) 24 hr tablet 7.5 mg, 7.5 mg, Oral, Daily, Lorretta Harp, MD, 7.5 mg at 04/20/19 0843 .  enoxaparin (LOVENOX) injection 75 mg, 0.5 mg/kg, Subcutaneous, Q12H, Margree Gimbel, MD, 75 mg at 04/19/19 2320 .  famotidine (PEPCID) tablet 40 mg, 40 mg, Oral, Daily, Salena Saner, MD, 40 mg at 04/20/19 0846 .  FLUoxetine (PROZAC) capsule 20 mg, 20 mg, Oral, Daily, Lorretta Harp, MD, 20 mg at 04/20/19 0844 .  folic acid (FOLVITE) tablet 1 mg, 1 mg, Oral, Daily, Lorretta Harp, MD, 1 mg at 04/20/19 0848 .  furosemide (LASIX) injection 40 mg, 40 mg, Intravenous, Q12H, Vida Rigger, MD, 40 mg at 04/19/19 2320 .  guaiFENesin (ROBITUSSIN) 100 MG/5ML solution 100 mg, 5 mL, Oral, Q4H PRN, Migdalia Dk, MD, 100 mg at 04/20/19 0914 .  insulin aspart (novoLOG) injection 0-20 Units, 0-20 Units, Subcutaneous, Q4H, Vida Rigger, MD, 7 Units at 04/20/19 0854 .  insulin aspart (novoLOG) injection 0-5 Units, 0-5 Units, Subcutaneous, QHS, Eugenie Norrie, NP, 4 Units at 04/18/19  2257 .  insulin aspart (novoLOG) injection 6 Units, 6 Units, Subcutaneous, TID WC, Danford, Earl Lites, MD, 6 Units at 04/20/19 0854 .  insulin glargine (LANTUS) injection 65 Units, 65 Units, Subcutaneous, Daily, Danford, Earl Lites, MD, 65 Units at 04/20/19 0849 .  levothyroxine (SYNTHROID) tablet 88 mcg, 88 mcg, Oral, QAC breakfast, Lorretta Harp, MD, 88 mcg at 04/20/19 0845 .  linagliptin (TRADJENTA) tablet 5 mg, 5 mg, Oral, Daily, Danford, Earl Lites, MD, 5 mg at 04/20/19 0843 .  lip balm (BLISTEX) ointment, , Topical, PRN, Harlon Ditty D, NP .  magic mouthwash, 15 mL, Oral, TID PRN, Salena Saner, MD, 15 mL at 04/10/19 0534 .  MEDLINE mouth rinse, 15 mL, Mouth Rinse, BID, Sonda Rumble  G, NP, 15 mL at 04/20/19 0854 .  methylPREDNISolone sodium succinate (SOLU-MEDROL) 40 mg/mL injection 40 mg, 40 mg, Intravenous, Q12H, Blakeney, Neldon Newport, NP, 40 mg at 04/20/19 0847 .  mupirocin ointment (BACTROBAN) 2 %, , Nasal, BID, Salena Saner, MD, Given at 04/20/19 [phone]  ondansetron (ZOFRAN) injection 4 mg, 4 mg, Intravenous, Q8H PRN, Lorretta Harp, MD, 4 mg at 04/08/19 1104 .  oxyCODONE (Oxy IR/ROXICODONE) immediate release tablet 5 mg, 5 mg, Oral, Q6H PRN, Lorretta Harp, MD, 5 mg at 04/20/19 0914 .  polyethylene glycol (MIRALAX / GLYCOLAX) packet 17 g, 17 g, Oral, Daily PRN, Harlon Ditty D, NP .  pramipexole (MIRAPEX) tablet 0.125 mg, 0.125 mg, Oral, BID, Lorretta Harp, MD, 0.125 mg at 04/20/19 0843 .  pregabalin (LYRICA) capsule 150 mg, 150 mg, Oral, TID, Lorretta Harp, MD, 150 mg at 04/20/19 0849 .  protein supplement (ENSURE MAX) liquid, 11 oz, Oral, BID BM, Annlouise Gerety, MD, 11 oz at 04/20/19 0903 .  sodium chloride flush (NS) 0.9 % injection 10-40 mL, 10-40 mL, Intracatheter, Q12H, Kasa, Kurian, MD, 10 mL at 04/20/19 0855 .  sodium chloride flush (NS) 0.9 % injection 10-40 mL, 10-40 mL, Intracatheter, PRN, Belia Heman, Kurian, MD .  zinc sulfate capsule 220 mg, 220 mg, Oral, Daily, Lorretta Harp, MD, 220 mg at 04/20/19 0848 .  zolpidem (AMBIEN) tablet 5 mg, 5 mg, Oral, QHS, Lorretta Harp, MD, 5 mg at 04/19/19 2116    ALLERGIES   Cephalexin, Codeine, Doxycycline, Propoxyphene, Sulfa antibiotics, Lovenox [enoxaparin sodium], Hydrocodone, and Meropenem    REVIEW OF SYSTEMS    Patient gives thumbs up for negatives and is negative except as per subjective findings  PHYSICAL EXAMINATION   Vital Signs: Temp:  [97.4 F (36.3 C)-98.7 F (37.1 C)] 98 F (36.7 C) (01/01 0856) Pulse Rate:  [81-97] 93 (01/01 1200) Resp:  [18-26] 18 (01/01 0000) BP: (83-136)/(56-96) 136/96 (01/01 1200) SpO2:  [80 %-100 %] 95 % (01/01 1200) FiO2 (%):  [75 %-100 %] 84 % (01/01 0900)  COVID-19 DISASTER DECLARATION:   FULL CONTACT PHYSICAL EXAMINATION WAS NOT POSSIBLE DUE TO TREATMENT OF COVID-19  AND CONSERVATION OF PERSONAL PROTECTIVE EQUIPMENT, LIMITED EXAM FINDINGS INCLUDE-  Physical Exam  Constitutional: She appears well-developed. No distress.  HENT:  Head: Normocephalic and atraumatic.  Eyes: EOM are normal. Left eye exhibits no discharge.  Neck: No tracheal deviation present.  Cardiovascular: Normal rate and regular rhythm.  Pulmonary/Chest: Effort normal. No respiratory distress.  Abdominal: There is no guarding.  Musculoskeletal:        General: No edema.     Cervical back: Normal range of motion.  Neurological: She is alert.  Skin: No rash noted. She is not diaphoretic. No erythema. There is pallor.     Patient assessed or the symptoms described in the history of present illness.  In the context of the Global COVID-19 pandemic, which necessitated consideration that the patient might be at risk for infection with the SARS-CoV-2 virus that causes COVID-19, Institutional protocols and algorithms that pertain to the evaluation of patients at risk for COVID-19 are in a state of rapid change based on information released by regulatory bodies including the CDC and federal and state  organizations. These policies and algorithms were followed during the patient's care while in hospital.   PERTINENT DATA     Infusions:  Scheduled Medications: . vitamin C  500 mg Oral Daily  . atorvastatin  80 mg Oral q1800  . busPIRone  10  mg Oral BID  . calcium-vitamin D  1 tablet Oral Daily  . Chlorhexidine Gluconate Cloth  6 each Topical Daily  . cholecalciferol  1,000 Units Oral Q breakfast  . ciclesonide  4 puff Inhalation BID  . colchicine  0.6 mg Oral Daily  . darifenacin  7.5 mg Oral Daily  . enoxaparin (LOVENOX) injection  0.5 mg/kg Subcutaneous Q12H  . famotidine  40 mg Oral Daily  . FLUoxetine  20 mg Oral Daily  . folic acid  1 mg Oral Daily  . furosemide  40 mg Intravenous Q12H  . insulin aspart  0-20 Units Subcutaneous Q4H  . insulin aspart  0-5 Units Subcutaneous QHS  . insulin aspart  6 Units Subcutaneous TID WC  . insulin glargine  65 Units Subcutaneous Daily  . levothyroxine  88 mcg Oral QAC breakfast  . linagliptin  5 mg Oral Daily  . mouth rinse  15 mL Mouth Rinse BID  . methylPREDNISolone (SOLU-MEDROL) injection  40 mg Intravenous Q12H  . mupirocin ointment   Nasal BID  . pramipexole  0.125 mg Oral BID  . pregabalin  150 mg Oral TID  . Ensure Max Protein  11 oz Oral BID BM  . sodium chloride flush  10-40 mL Intracatheter Q12H  . zinc sulfate  220 mg Oral Daily  . zolpidem  5 mg Oral QHS   PRN Medications: acetaminophen, albuterol, ALPRAZolam, guaiFENesin, lip balm, magic mouthwash, ondansetron (ZOFRAN) IV, oxyCODONE, polyethylene glycol, sodium chloride flush Hemodynamic parameters:   Intake/Output: 12/31 0701 - 01/01 0700 In: 900 [P.O.:900] Out: 2950 [Urine:2950]  Ventilator  Settings: FiO2 (%):  [75 %-100 %] 84 %    LAB RESULTS:  Basic Metabolic Panel: Recent Labs  Lab 04/15/19 0421 04/16/19 0354 04/17/19 0620 04/18/19 0433 04/20/19 0518  NA 137 135 133* 136 131*  K 4.2 4.5 4.2 4.6 4.2  CL 102 101 99 98 96*  CO2 23 23 23 25 23     GLUCOSE 200* 300* 170* 189* 273*  BUN 33* 39* 43* 54* 67*  CREATININE 0.91 1.08* 1.06* 1.37* 1.27*  CALCIUM 9.1 8.7* 9.1 9.4 9.1   Liver Function Tests: No results for input(s): AST, ALT, ALKPHOS, BILITOT, PROT, ALBUMIN in the last 168 hours. No results for input(s): LIPASE, AMYLASE in the last 168 hours. No results for input(s): AMMONIA in the last 168 hours. CBC: Recent Labs  Lab 04/15/19 0421 04/16/19 0354 04/17/19 0620 04/18/19 0433 04/20/19 0518  WBC 13.0* 13.4* 13.5* 19.3* 16.9*  HGB 11.7* 11.6* 12.4 12.4 12.2  HCT 35.4* 37.0 39.4 39.8 38.8  MCV 78.3* 82.0 82.3 82.1 82.4  PLT 322 284 288 302 271   Cardiac Enzymes: No results for input(s): CKTOTAL, CKMB, CKMBINDEX, TROPONINI in the last 168 hours. BNP: Invalid input(s): POCBNP CBG: Recent Labs  Lab 04/19/19 1952 04/19/19 2358 04/20/19 0403 04/20/19 0806 04/20/19 1157  GLUCAP 412* 283* 250* 249* 217*     IMAGING RESULTS:  Imaging: No results found.     ASSESSMENT AND PLAN    -Multidisciplinary rounds held today  Acute Hypoxic Respiratory Failure -due to post covid pneumonitis - currently on solumedrol 40 bid  -will obtain fungitell  -continue Bronchodilator Therapy - repeat CXR 1/1  Chronic diastolic CHF -oxygen as needed ICU monitoring -patient is now negative 20 liters and has developed mild prerenal AKI - will hold diuresis  Mild stage 1 AKI  - likely due to volume contraction from diuresis - will hold lasix today  GI/Nutrition GI PROPHYLAXIS as  indicated DIET-->TF's as tolerated Constipation protocol as indicated  ENDO - ICU hypoglycemic\Hyperglycemia protocol -check FSBS per protocol   ELECTROLYTES -follow labs as needed -replace as needed -pharmacy consultation   DVT/GI PRX ordered -SCDs  TRANSFUSIONS AS NEEDED MONITOR FSBS ASSESS the need for LABS as needed   Critical care provider statement:    Critical care time (minutes):  33   Critical care time was exclusive  of:  Separately billable procedures and treating other patients   Critical care was necessary to treat or prevent imminent or life-threatening deterioration of the following conditions:  Acute hypoxemic respiratory failure, multiple comorbid conditions   Critical care was time spent personally by me on the following activities:  Development of treatment plan with patient or surrogate, discussions with consultants, evaluation of patient's response to treatment, examination of patient, obtaining history from patient or surrogate, ordering and performing treatments and interventions, ordering and review of laboratory studies and re-evaluation of patient's condition.  I assumed direction of critical care for this patient from another provider in my specialty: no    This document was prepared using Dragon voice recognition software and may include unintentional dictation errors.    Vida Rigger, M.D.  Division of Pulmonary & Critical Care Medicine  [facility]

## 2019-04-20 NOTE — Progress Notes (Addendum)
Inpatient Diabetes Program Recommendations  AACE/ADA: New Consensus Statement on Inpatient Glycemic Control (2015)  Target Ranges:  Prepandial:   less than 140 mg/dL      Peak postprandial:   less than 180 mg/dL (1-2 hours)      Critically ill patients:  140 - 180 mg/dL   Lab Results  Component Value Date   GLUCAP 249 (H) 04/20/2019   HGBA1C 10.7 (H) 03/27/2019    Review of Glycemic Control  Blood sugars above goal of 140-180 mg/dL. Needs titration of both basal and bolus insulin.  Inpatient Diabetes Program Recommendations:     Increase Lantus HS dose to 5 units QHS. Continue with Lantus 65 units QD. Increase Novolog to 10 units tidwc for meal coverage insulin.  Continue to follow trends.  Thank you. Ailene Ards, RD, LDN, CDE Inpatient Diabetes Coordinator [phone]

## 2019-04-20 NOTE — Progress Notes (Signed)
MEDICATION RELATED CONSULT NOTE - FOLLOW UP   Pharmacy Consult for Insulin regimen   Allergies  Allergen Reactions  . Cephalexin Hives  . Codeine Palpitations, Nausea Only, Nausea And Vomiting, Rash and Shortness Of Breath    "makes heart fly, she gets flushed and passes out"  . Doxycycline Rash  . Propoxyphene Rash and Shortness Of Breath    Increase heart rate  . Sulfa Antibiotics Palpitations, Nausea Only, Shortness Of Breath and Hives    "makes heart fly, she gets flushed and passes out"  . Lovenox [Enoxaparin Sodium] Hives  . Hydrocodone Nausea And Vomiting    Hear racing & breaks out into a cold sweat.  . Meropenem Rash    Erythematous, hot, pruritic rash over arms, chest, back, abdomen, and face occurred at the end of meropenem infusion on 02/22/18    Patient Measurements: Height: 5\' 9"  (175.3 cm) Weight: (!) 320 lb (145.2 kg) IBW/kg (Calculated) : 66.2   Vital Signs: Temp: 98 F (36.7 C) (01/01 0856) Temp Source: Oral (01/01 0856) BP: 136/96 (01/01 1200) Pulse Rate: 93 (01/01 1200) Intake/Output from previous day: 12/31 0701 - 01/01 0700 In: 900 [P.O.:900] Out: 2950 [Urine:2950] Intake/Output from this shift: No intake/output data recorded.   Assessment: Lantus 65 units daily Novolog 6 TID w/ meals SSI 0-20 q4h linagliptin  Methylpred 40 mg IV q12h since 12/22    Plan:  1/1  Per Diabetes Coordinator recommendation: Will add Lantus 5 units at bedtime Will increase Novolog to 10 units TID w/ meals Will follow up in AM to assess trend  [name] [name] A 04/20/2019,12:56 PM

## 2019-04-21 LAB — BASIC METABOLIC PANEL
Anion gap: 12 (ref 5–15)
BUN: 78 mg/dL — ABNORMAL HIGH (ref 6–20)
CO2: 21 mmol/L — ABNORMAL LOW (ref 22–32)
Calcium: 9 mg/dL (ref 8.9–10.3)
Chloride: 97 mmol/L — ABNORMAL LOW (ref 98–111)
Creatinine, Ser: 1.33 mg/dL — ABNORMAL HIGH (ref 0.44–1.00)
GFR calc Af Amer: 51 mL/min — ABNORMAL LOW (ref >60)
GFR calc non Af Amer: 44 mL/min — ABNORMAL LOW (ref >60)
Glucose, Bld: 361 mg/dL — ABNORMAL HIGH (ref 70–99)
Potassium: 4.1 mmol/L (ref 3.5–5.1)
Sodium: 130 mmol/L — ABNORMAL LOW (ref 135–145)

## 2019-04-21 LAB — CBC
HCT: 38.3 % (ref 36.0–46.0)
Hemoglobin: 11.9 g/dL — ABNORMAL LOW (ref 12.0–15.0)
MCH: 25.3 pg — ABNORMAL LOW (ref 26.0–34.0)
MCHC: 31.1 g/dL (ref 30.0–36.0)
MCV: 81.3 fL (ref 80.0–100.0)
Platelets: 267 10*3/uL (ref 150–400)
RBC: 4.71 MIL/uL (ref 3.87–5.11)
RDW: 16.8 % — ABNORMAL HIGH (ref 11.5–15.5)
WBC: 20.1 10*3/uL — ABNORMAL HIGH (ref 4.0–10.5)
nRBC: 0 % (ref 0.0–0.2)

## 2019-04-21 LAB — GLUCOSE, CAPILLARY
Glucose-Capillary: 281 mg/dL — ABNORMAL HIGH (ref 70–99)
Glucose-Capillary: 360 mg/dL — ABNORMAL HIGH (ref 70–99)
Glucose-Capillary: 471 mg/dL — ABNORMAL HIGH (ref 70–99)
Glucose-Capillary: 354 mg/dL — ABNORMAL HIGH (ref 70–99)

## 2019-04-21 MED ORDER — INSULIN ASPART 100 UNIT/ML ~~LOC~~ SOLN
0.0000 [IU] | Freq: Three times a day (TID) | SUBCUTANEOUS | Status: DC
  Administered 2019-04-21: 20 [IU] via SUBCUTANEOUS
  Administered 2019-04-21: 25 [IU] via SUBCUTANEOUS
  Administered 2019-04-23: 7 [IU] via SUBCUTANEOUS
  Administered 2019-04-23: 15 [IU] via SUBCUTANEOUS
  Administered 2019-04-23 – 2019-04-24 (×2): 3 [IU] via SUBCUTANEOUS
  Administered 2019-04-24: 16:00:00 11 [IU] via SUBCUTANEOUS
  Administered 2019-04-25: 17:00:00 4 [IU] via SUBCUTANEOUS
  Administered 2019-04-26 – 2019-04-27 (×2): 7 [IU] via SUBCUTANEOUS
  Administered 2019-04-28: 17:00:00 2 [IU] via SUBCUTANEOUS
  Administered 2019-04-29: 11 [IU] via SUBCUTANEOUS
  Administered 2019-04-29 – 2019-04-30 (×2): 4 [IU] via SUBCUTANEOUS
  Administered 2019-04-30: 11 [IU] via SUBCUTANEOUS
  Administered 2019-04-30: 3 [IU] via SUBCUTANEOUS
  Administered 2019-05-01: 7 [IU] via SUBCUTANEOUS
  Administered 2019-05-02: 1 [IU] via SUBCUTANEOUS
  Administered 2019-05-02: 11 [IU] via SUBCUTANEOUS
  Administered 2019-05-03: 7 [IU] via SUBCUTANEOUS
  Administered 2019-05-03: 4 [IU] via SUBCUTANEOUS
  Administered 2019-05-03: 3 [IU] via SUBCUTANEOUS
  Administered 2019-05-04: 4 [IU] via SUBCUTANEOUS
  Filled 2019-04-21 (×25): qty 1

## 2019-04-21 MED ORDER — INSULIN ASPART 100 UNIT/ML ~~LOC~~ SOLN
25.0000 [IU] | Freq: Once | SUBCUTANEOUS | Status: AC
  Administered 2019-04-21: 18:00:00 25 [IU] via SUBCUTANEOUS

## 2019-04-21 MED ORDER — INSULIN ASPART 100 UNIT/ML ~~LOC~~ SOLN
14.0000 [IU] | Freq: Three times a day (TID) | SUBCUTANEOUS | Status: DC
  Administered 2019-04-21 – 2019-04-22 (×3): 14 [IU] via SUBCUTANEOUS
  Filled 2019-04-21 (×3): qty 1

## 2019-04-21 MED ORDER — INSULIN GLARGINE 100 UNIT/ML ~~LOC~~ SOLN
15.0000 [IU] | Freq: Every day | SUBCUTANEOUS | Status: DC
  Administered 2019-04-21 – 2019-04-25 (×5): 15 [IU] via SUBCUTANEOUS
  Filled 2019-04-21 (×6): qty 0.15

## 2019-04-21 MED ORDER — DEXAMETHASONE 4 MG PO TABS
6.0000 mg | ORAL_TABLET | Freq: Every day | ORAL | Status: DC
  Administered 2019-04-22 – 2019-04-25 (×4): 6 mg via ORAL
  Filled 2019-04-21 (×4): qty 1.5

## 2019-04-21 NOTE — Progress Notes (Signed)
CRITICAL CARE PROGRESS NOTE    Name: [name] [name] MRN: [id number] DOB: 03/16/1961     LOS: 5   SUBJECTIVE FINDINGS & SIGNIFICANT EVENTS   Patient description:  [name] [name] an 59 y.o.femaleformer smoker with a history of diabetes mellitus, morbid obesity and rheumatoid arthritis diagnosed with COVID-19 on December 4. Admitted to [place] from 8 December through 12 December received 5 days of remdesivir. Also received dexamethasone. She has been found with ARDS   Lines / Drains: PIVx2  Cultures / Sepsis markers: Blood culture X1 positive for staph epi with resistance - possibly contaminant  Antibiotics: None    Protocols / Consultants: Hospitalist/pccm   Tests / Events: None   Overnight: 12/28-Weaned down to  75%FiO2, mentation improving.  -12/29- clinically imporved, diet advanced, heparin decreased per PTT 12/30 -HFNC75%, self proning 12/31 - continue to wean O2,monitor vitals, patient hypotenisve this am  1/1 - patient smiling this am , eating breakfast, no distress, plan to continue weaning fio2 on high flow 1/2 - no overnight events   PAST MEDICAL HISTORY   Past Medical History:  Diagnosis Date  . Abdominal wall hernia 01/29/2013  . Anxiety   . Arthritis    Rheumatoid  . C. difficile colitis   . Chronic diastolic heart failure ([place])   . Depression   . Diabetes mellitus    states no meds or diet restrictions  at present  . Diastolic CHF ([place])   . Esophagitis   . Fluid retention   . GERD (gastroesophageal reflux disease)   . Hiatal hernia   . Hypertension   . Hypokalemia due to loss of potassium 10/21/2015   Overview:  Associated with 3 weeks of diarrhea  And QT prolongation.  . Hypothyroidism   . IBS (irritable bowel syndrome)   . Moderate episode of  recurrent major depressive disorder ([place]) 06/03/2004  . Morbid obesity ([place])   . MRSA (methicillin resistant Staphylococcus aureus) infection 11/2017   left inner thigh abcess  . Neurogenic bladder    has pacemaker  . Neuropathy   . Obesity   . Panic attacks   . Rheumatoid arthritis ([place])   . Sleep apnea    STATES SEVERE, CANT TOLERATE MASK- LAST STUDY YEARS AGO     SURGICAL HISTORY   Past Surgical History:  Procedure Laterality Date  . ABDOMINAL HYSTERECTOMY    . CHOLECYSTECTOMY    . DG GREAT TOE RIGHT FOOT  02/23/2018  . EYE SURGERY     bilateral cataract extraction with IOL  . HERNIA REPAIR     ventral hernia with strangulation  . LAPAROSCOPIC GASTRIC BANDING  03/20/07  . TONSILLECTOMY    . TUBAL LIGATION       FAMILY HISTORY   Family History  Problem Relation Age of Onset  . Heart failure Father   . Bipolar disorder Father   . Alcohol abuse Father   . Anxiety disorder Father   . Depression Father   . Heart disease Brother   . Heart attack Brother 38       MI s/p stents placed  . Anxiety disorder Sister   . Depression Sister   . Anxiety disorder Sister   . Depression Sister   . Bipolar disorder Sister   . Alcohol abuse Sister   . Drug abuse Sister   . Heart attack Brother      SOCIAL HISTORY   Social History   Tobacco Use  . Smoking status: Former Smoker  Packs/day: 2.00    Years: 27.00    Pack years: 54.00    Types: Cigarettes    Quit date: 07/30/1999    Years since quitting: 19.7  . Smokeless tobacco: Never Used  Substance Use Topics  . Alcohol use: No  . Drug use: No     MEDICATIONS   Current Medication:  Current Facility-Administered Medications:  .  acetaminophen (TYLENOL) tablet 650 mg, 650 mg, Oral, Q6H PRN, Lorretta Harp, MD, 650 mg at 04/10/19 0224 .  albuterol (VENTOLIN HFA) 108 (90 Base) MCG/ACT inhaler 2 puff, 2 puff, Inhalation, Q4H PRN, Lorretta Harp, MD .  ALPRAZolam Prudy Feeler) tablet 0.5 mg, 0.5 mg, Oral, TID PRN, Erin Fulling,  MD, 0.5 mg at 04/17/19 2127 .  ascorbic acid (VITAMIN C) tablet 500 mg, 500 mg, Oral, Daily, Lorretta Harp, MD, 500 mg at 04/21/19 0840 .  atorvastatin (LIPITOR) tablet 80 mg, 80 mg, Oral, q1800, Salena Saner, MD, 80 mg at 04/20/19 2209 .  busPIRone (BUSPAR) tablet 10 mg, 10 mg, Oral, BID, Lorretta Harp, MD, 10 mg at 04/21/19 0845 .  calcium-vitamin D (OSCAL WITH D) 500-200 MG-UNIT per tablet 1 tablet, 1 tablet, Oral, Daily, Lorretta Harp, MD, 1 tablet at 04/21/19 0840 .  Chlorhexidine Gluconate Cloth 2 % PADS 6 each, 6 each, Topical, Daily, Danford, Earl Lites, MD, 6 each at 04/20/19 [phone] .  cholecalciferol (VITAMIN D3) tablet 1,000 Units, 1,000 Units, Oral, Q breakfast, Lorretta Harp, MD, 1,000 Units at 04/21/19 [phone]  ciclesonide (ALVESCO) 160 MCG/ACT inhaler 4 puff, 4 puff, Inhalation, BID, Salena Saner, MD, 4 puff at 04/20/19 2204 .  darifenacin (ENABLEX) 24 hr tablet 7.5 mg, 7.5 mg, Oral, Daily, Lorretta Harp, MD, 7.5 mg at 04/21/19 0844 .  enoxaparin (LOVENOX) injection 75 mg, 0.5 mg/kg, Subcutaneous, Q12H, [name] Christmas, Lelynd Poer, MD, 75 mg at 04/20/19 2210 .  famotidine (PEPCID) tablet 40 mg, 40 mg, Oral, Daily, Salena Saner, MD, 40 mg at 04/21/19 0841 .  FLUoxetine (PROZAC) capsule 20 mg, 20 mg, Oral, Daily, Lorretta Harp, MD, 20 mg at 04/21/19 0844 .  folic acid (FOLVITE) tablet 1 mg, 1 mg, Oral, Daily, Lorretta Harp, MD, 1 mg at 04/21/19 0840 .  guaiFENesin (ROBITUSSIN) 100 MG/5ML solution 100 mg, 5 mL, Oral, Q4H PRN, Migdalia Dk, MD, 100 mg at 04/21/19 0154 .  insulin aspart (novoLOG) injection 0-20 Units, 0-20 Units, Subcutaneous, TID WC, Alayzha An, MD .  insulin aspart (novoLOG) injection 0-5 Units, 0-5 Units, Subcutaneous, QHS, Eugenie Norrie, NP, 3 Units at 04/20/19 2208 .  insulin aspart (novoLOG) injection 14 Units, 14 Units, Subcutaneous, TID WC, Jenella Craigie, MD .  insulin glargine (LANTUS) injection 15 Units, 15 Units, Subcutaneous, QHS, Zineb Glade, MD .   insulin glargine (LANTUS) injection 65 Units, 65 Units, Subcutaneous, Daily, Danford, Earl Lites, MD, 65 Units at 04/21/19 0836 .  levothyroxine (SYNTHROID) tablet 88 mcg, 88 mcg, Oral, QAC breakfast, Lorretta Harp, MD, 88 mcg at 04/21/19 0845 .  linagliptin (TRADJENTA) tablet 5 mg, 5 mg, Oral, Daily, Danford, Earl Lites, MD, 5 mg at 04/21/19 0844 .  lip balm (BLISTEX) ointment, , Topical, PRN, Harlon Ditty D, NP .  magic mouthwash, 15 mL, Oral, TID PRN, Salena Saner, MD, 15 mL at 04/10/19 0534 .  MEDLINE mouth rinse, 15 mL, Mouth Rinse, BID, Blakeney, Neldon Newport, NP, 15 mL at 04/20/19 2210 .  methylPREDNISolone sodium succinate (SOLU-MEDROL) 40 mg/mL injection 40 mg, 40 mg, Intravenous, Q12H, Blakeney, Neldon Newport, NP, 40 mg  at 04/21/19 0837 .  mupirocin ointment (BACTROBAN) 2 %, , Nasal, BID, Salena Saner, MD, Given at 04/20/19 2206 .  ondansetron (ZOFRAN) injection 4 mg, 4 mg, Intravenous, Q8H PRN, Lorretta Harp, MD, 4 mg at 04/08/19 1104 .  oxyCODONE (Oxy IR/ROXICODONE) immediate release tablet 5 mg, 5 mg, Oral, Q6H PRN, Lorretta Harp, MD, 5 mg at 04/21/19 0154 .  polyethylene glycol (MIRALAX / GLYCOLAX) packet 17 g, 17 g, Oral, Daily PRN, Harlon Ditty D, NP .  pramipexole (MIRAPEX) tablet 0.125 mg, 0.125 mg, Oral, BID, Lorretta Harp, MD, 0.125 mg at 04/20/19 2206 .  pregabalin (LYRICA) capsule 150 mg, 150 mg, Oral, TID, Lorretta Harp, MD, 150 mg at 04/21/19 0840 .  protein supplement (ENSURE MAX) liquid, 11 oz, Oral, BID BM, Breven Guidroz, MD, 11 oz at 04/20/19 0903 .  sodium chloride flush (NS) 0.9 % injection 10-40 mL, 10-40 mL, Intracatheter, Q12H, Kasa, Kurian, MD, 20 mL at 04/21/19 0846 .  sodium chloride flush (NS) 0.9 % injection 10-40 mL, 10-40 mL, Intracatheter, PRN, Erin Fulling, MD .  zinc sulfate capsule 220 mg, 220 mg, Oral, Daily, Lorretta Harp, MD, 220 mg at 04/21/19 9509 .  zolpidem (AMBIEN) tablet 5 mg, 5 mg, Oral, QHS, Lorretta Harp, MD, 5 mg at 04/20/19 2207    ALLERGIES    Cephalexin, Codeine, Doxycycline, Propoxyphene, Sulfa antibiotics, Lovenox [enoxaparin sodium], Hydrocodone, and Meropenem    REVIEW OF SYSTEMS    Patient gives thumbs up for negatives and is negative except as per subjective findings  PHYSICAL EXAMINATION   Vital Signs: Temp:  [98 F (36.7 C)-98.9 F (37.2 C)] 98.6 F (37 C) (01/02 0800) Pulse Rate:  [77-117] 87 (01/02 0900) Resp:  [17-26] 23 (01/02 0900) BP: (91-136)/(37-96) 114/74 (01/02 0800) SpO2:  [83 %-100 %] 89 % (01/02 0900) FiO2 (%):  [80 %-100 %] 80 % (01/02 0857)  COVID-19 DISASTER DECLARATION:   FULL CONTACT PHYSICAL EXAMINATION WAS NOT POSSIBLE DUE TO TREATMENT OF COVID-19  AND CONSERVATION OF PERSONAL PROTECTIVE EQUIPMENT, LIMITED EXAM FINDINGS INCLUDE-  Physical Exam  Constitutional: She appears well-developed. No distress.  HENT:  Head: Normocephalic and atraumatic.  Eyes: EOM are normal. Left eye exhibits no discharge.  Neck: No tracheal deviation present.  Cardiovascular: Normal rate and regular rhythm.  Pulmonary/Chest: Effort normal. No respiratory distress.  Abdominal: There is no guarding.  Musculoskeletal:        General: No edema.     Cervical back: Normal range of motion.  Neurological: She is alert.  Skin: No rash noted. She is not diaphoretic. No erythema. There is pallor.     Patient assessed or the symptoms described in the history of present illness.  In the context of the Global COVID-19 pandemic, which necessitated consideration that the patient might be at risk for infection with the SARS-CoV-2 virus that causes COVID-19, Institutional protocols and algorithms that pertain to the evaluation of patients at risk for COVID-19 are in a state of rapid change based on information released by regulatory bodies including the CDC and federal and state organizations. These policies and algorithms were followed during the patient's care while in hospital.   PERTINENT DATA      Infusions:  Scheduled Medications: . vitamin C  500 mg Oral Daily  . atorvastatin  80 mg Oral q1800  . busPIRone  10 mg Oral BID  . calcium-vitamin D  1 tablet Oral Daily  . Chlorhexidine Gluconate Cloth  6 each Topical Daily  . cholecalciferol  1,000 Units  Oral Q breakfast  . ciclesonide  4 puff Inhalation BID  . darifenacin  7.5 mg Oral Daily  . enoxaparin (LOVENOX) injection  0.5 mg/kg Subcutaneous Q12H  . famotidine  40 mg Oral Daily  . FLUoxetine  20 mg Oral Daily  . folic acid  1 mg Oral Daily  . insulin aspart  0-20 Units Subcutaneous TID WC  . insulin aspart  0-5 Units Subcutaneous QHS  . insulin aspart  14 Units Subcutaneous TID WC  . insulin glargine  15 Units Subcutaneous QHS  . insulin glargine  65 Units Subcutaneous Daily  . levothyroxine  88 mcg Oral QAC breakfast  . linagliptin  5 mg Oral Daily  . mouth rinse  15 mL Mouth Rinse BID  . methylPREDNISolone (SOLU-MEDROL) injection  40 mg Intravenous Q12H  . mupirocin ointment   Nasal BID  . pramipexole  0.125 mg Oral BID  . pregabalin  150 mg Oral TID  . Ensure Max Protein  11 oz Oral BID BM  . sodium chloride flush  10-40 mL Intracatheter Q12H  . zinc sulfate  220 mg Oral Daily  . zolpidem  5 mg Oral QHS   PRN Medications: acetaminophen, albuterol, ALPRAZolam, guaiFENesin, lip balm, magic mouthwash, ondansetron (ZOFRAN) IV, oxyCODONE, polyethylene glycol, sodium chloride flush Hemodynamic parameters:   Intake/Output: 01/01 0701 - 01/02 0700 In: 480 [P.O.:480] Out: 800 [Urine:800]  Ventilator  Settings: FiO2 (%):  [80 %-100 %] 80 %    LAB RESULTS:  Basic Metabolic Panel: Recent Labs  Lab 04/16/19 0354 04/17/19 0620 04/18/19 0433 04/20/19 0518 04/21/19 0501  NA 135 133* 136 131* 130*  K 4.5 4.2 4.6 4.2 4.1  CL 101 99 98 96* 97*  CO2 23 23 25 23  21*  GLUCOSE 300* 170* 189* 273* 361*  BUN 39* 43* 54* 67* 78*  CREATININE 1.08* 1.06* 1.37* 1.27* 1.33*  CALCIUM 8.7* 9.1 9.4 9.1 9.0   Liver  Function Tests: No results for input(s): AST, ALT, ALKPHOS, BILITOT, PROT, ALBUMIN in the last 168 hours. No results for input(s): LIPASE, AMYLASE in the last 168 hours. No results for input(s): AMMONIA in the last 168 hours. CBC: Recent Labs  Lab 04/16/19 0354 04/17/19 0620 04/18/19 0433 04/20/19 0518 04/21/19 0501  WBC 13.4* 13.5* 19.3* 16.9* 20.1*  HGB 11.6* 12.4 12.4 12.2 11.9*  HCT 37.0 39.4 39.8 38.8 38.3  MCV 82.0 82.3 82.1 82.4 81.3  PLT 284 288 302 271 267   Cardiac Enzymes: No results for input(s): CKTOTAL, CKMB, CKMBINDEX, TROPONINI in the last 168 hours. BNP: Invalid input(s): POCBNP CBG: Recent Labs  Lab 04/20/19 0806 04/20/19 1157 04/20/19 1625 04/20/19 2202 04/21/19 0829  GLUCAP 249* 217* 314* 251* 281*     IMAGING RESULTS:  Imaging: DG Chest Port 1 View  Result Date: 04/20/2019 CLINICAL DATA:  COVID-19 positive. EXAM: PORTABLE CHEST 1 VIEW COMPARISON:  April 11, 2019. FINDINGS: Stable cardiomediastinal silhouette. Stable bilateral multiple opacities are noted throughout both lungs. No pneumothorax or pleural effusion is noted. Bony thorax is unremarkable. IMPRESSION: Stable bilateral multiple lung opacities are noted concerning for multifocal pneumonia. Electronically Signed   By: April 13, 2019 M.D.   On: 04/20/2019 13:04       ASSESSMENT AND PLAN    -Multidisciplinary rounds held today  Acute Hypoxic Respiratory Failure -due to post covid pneumonitis - currently on solumedrol 40 bid  -will obtain fungitell  -continue Bronchodilator Therapy - repeat CXR 1/1  Chronic diastolic CHF -oxygen as needed ICU monitoring -patient  is now negative 20 liters and has developed mild prerenal AKI - will hold diuresis  Mild stage 1 AKI  - likely due to volume contraction from diuresis - will hold lasix today  GI/Nutrition GI PROPHYLAXIS as indicated DIET-->TF's as tolerated Constipation protocol as indicated  ENDO - ICU  hypoglycemic\Hyperglycemia protocol -check FSBS per protocol   ELECTROLYTES -follow labs as needed -replace as needed -pharmacy consultation   DVT/GI PRX ordered -SCDs  TRANSFUSIONS AS NEEDED MONITOR FSBS ASSESS the need for LABS as needed   Critical care provider statement:    Critical care time (minutes):  33   Critical care time was exclusive of:  Separately billable procedures and treating other patients   Critical care was necessary to treat or prevent imminent or life-threatening deterioration of the following conditions:  Acute hypoxemic respiratory failure, multiple comorbid conditions   Critical care was time spent personally by me on the following activities:  Development of treatment plan with patient or surrogate, discussions with consultants, evaluation of patient's response to treatment, examination of patient, obtaining history from patient or surrogate, ordering and performing treatments and interventions, ordering and review of laboratory studies and re-evaluation of patient's condition.  I assumed direction of critical care for this patient from another provider in my specialty: no    This document was prepared using Dragon voice recognition software and may include unintentional dictation errors.    Vida Rigger, M.D.  Division of Pulmonary & Critical Care Medicine  [facility]

## 2019-04-21 NOTE — Progress Notes (Signed)
MEDICATION RELATED CONSULT NOTE - FOLLOW UP   Pharmacy Consult for Insulin regimen   Allergies  Allergen Reactions  . Cephalexin Hives  . Codeine Palpitations, Nausea Only, Nausea And Vomiting, Rash and Shortness Of Breath    "makes heart fly, she gets flushed and passes out"  . Doxycycline Rash  . Propoxyphene Rash and Shortness Of Breath    Increase heart rate  . Sulfa Antibiotics Palpitations, Nausea Only, Shortness Of Breath and Hives    "makes heart fly, she gets flushed and passes out"  . Lovenox [Enoxaparin Sodium] Hives  . Hydrocodone Nausea And Vomiting    Hear racing & breaks out into a cold sweat.  . Meropenem Rash    Erythematous, hot, pruritic rash over arms, chest, back, abdomen, and face occurred at the end of meropenem infusion on 02/22/18    Patient Measurements: Height: 5\' 9"  (175.3 cm) Weight: (!) 320 lb (145.2 kg) IBW/kg (Calculated) : 66.2   Vital Signs: Temp: 98.6 F (37 C) (01/02 0800) Temp Source: Oral (01/02 0800) BP: 114/74 (01/02 0800) Pulse Rate: 87 (01/02 0900) Intake/Output from previous day: 01/01 0701 - 01/02 0700 In: 480 [P.O.:480] Out: 800 [Urine:800] Intake/Output from this shift: Total I/O In: 140 [P.O.:120; I.V.:20] Out: -    Assessment: Lantus 65 units daily Novolog 6 TID w/ meals SSI 0-20 q4h linagliptin  Methylpred 40 mg IV q12h since 12/22  1/1  Per Diabetes Coordinator recommendation: Will add Lantus 5 units at bedtime Will increase Novolog to 10 units TID w/ meals   Plan:  BG 217-361 with AM BG of 281 Discussed with ICU MD: Lantus increased to 15 units qhs Meal time increased to 14 units TIDWM SSI reordered TIDWM (had been d/c'd O/N) Will follow up in AM to assess trend  08-01-1975 L 04/21/2019,10:59 AM

## 2019-04-22 LAB — CBC WITH DIFFERENTIAL/PLATELET
Abs Immature Granulocytes: 0.23 10*3/uL — ABNORMAL HIGH (ref 0.00–0.07)
Basophils Absolute: 0 10*3/uL (ref 0.0–0.1)
Basophils Relative: 0 %
Eosinophils Absolute: 0 10*3/uL (ref 0.0–0.5)
Eosinophils Relative: 0 %
HCT: 36.2 % (ref 36.0–46.0)
Hemoglobin: 12.1 g/dL (ref 12.0–15.0)
Immature Granulocytes: 1 %
Lymphocytes Relative: 9 %
Lymphs Abs: 1.6 10*3/uL (ref 0.7–4.0)
MCH: 26.1 pg (ref 26.0–34.0)
MCHC: 33.4 g/dL (ref 30.0–36.0)
MCV: 78 fL — ABNORMAL LOW (ref 80.0–100.0)
Monocytes Absolute: 1.2 10*3/uL — ABNORMAL HIGH (ref 0.1–1.0)
Monocytes Relative: 7 %
Neutro Abs: 13.5 10*3/uL — ABNORMAL HIGH (ref 1.7–7.7)
Neutrophils Relative %: 83 %
Platelets: 246 10*3/uL (ref 150–400)
RBC: 4.64 MIL/uL (ref 3.87–5.11)
RDW: 17.1 % — ABNORMAL HIGH (ref 11.5–15.5)
WBC: 16.6 10*3/uL — ABNORMAL HIGH (ref 4.0–10.5)
nRBC: 0 % (ref 0.0–0.2)

## 2019-04-22 LAB — BASIC METABOLIC PANEL
Anion gap: 10 (ref 5–15)
BUN: 66 mg/dL — ABNORMAL HIGH (ref 6–20)
CO2: 26 mmol/L (ref 22–32)
Calcium: 9.2 mg/dL (ref 8.9–10.3)
Chloride: 100 mmol/L (ref 98–111)
Creatinine, Ser: 0.95 mg/dL (ref 0.44–1.00)
GFR calc Af Amer: >60 mL/min (ref >60)
GFR calc non Af Amer: >60 mL/min (ref >60)
Glucose, Bld: 122 mg/dL — ABNORMAL HIGH (ref 70–99)
Potassium: 4 mmol/L (ref 3.5–5.1)
Sodium: 136 mmol/L (ref 135–145)

## 2019-04-22 LAB — GLUCOSE, CAPILLARY
Glucose-Capillary: 97 mg/dL (ref 70–99)
Glucose-Capillary: 103 mg/dL — ABNORMAL HIGH (ref 70–99)
Glucose-Capillary: 116 mg/dL — ABNORMAL HIGH (ref 70–99)
Glucose-Capillary: 297 mg/dL — ABNORMAL HIGH (ref 70–99)

## 2019-04-22 MED ORDER — INSULIN ASPART 100 UNIT/ML ~~LOC~~ SOLN
10.0000 [IU] | Freq: Three times a day (TID) | SUBCUTANEOUS | Status: DC
  Administered 2019-04-22 – 2019-04-23 (×4): 10 [IU] via SUBCUTANEOUS
  Filled 2019-04-22 (×5): qty 1

## 2019-04-22 NOTE — Progress Notes (Signed)
Shift summary:  - Patient remains on HFNC; no appreciable progress in respiratory status.

## 2019-04-22 NOTE — Progress Notes (Signed)
CRITICAL CARE PROGRESS NOTE    Name: [name] [name] MRN: [id number] DOB: 1960-05-16     LOS: 41   SUBJECTIVE FINDINGS & SIGNIFICANT EVENTS   Patient description:  [name] [name] an 59 y.o.femaleformer smoker with a history of diabetes mellitus, morbid obesity and rheumatoid arthritis diagnosed with COVID-19 on December 4. Admitted to [facility] from 8 December through 12 December received 5 days of remdesivir. Also received dexamethasone. She has been found with ARDS   Lines / Drains: PIVx2  Cultures / Sepsis markers: Blood culture X1 positive for staph epi with resistance - possibly contaminant  Antibiotics: None    Protocols / Consultants: Hospitalist/pccm   Tests / Events: 12/28-Weaned down to  75%FiO2, mentation improving.  -12/29- clinically imporved, diet advanced, heparin decreased per PTT 12/30 -HFNC75%, self proning 12/31 - continue to wean O2,monitor vitals, patient hypotenisve this am  1/1 - patient smiling this am , eating breakfast, no distress, plan to continue weaning fio2 on high flow 1/2 - no overnight events  1/3 severe hypoxia  CC Follow up resp failure  HPI Remains with severe hypoxia proning [name] as tolerated High risk for intubation and death  PAST MEDICAL HISTORY   Past Medical History:  Diagnosis Date  . Abdominal wall hernia 01/29/2013  . Anxiety   . Arthritis    Rheumatoid  . C. difficile colitis   . Chronic diastolic heart failure ([place])   . Depression   . Diabetes mellitus    states no meds or diet restrictions  at present  . Diastolic CHF ([place])   . Esophagitis   . Fluid retention   . GERD (gastroesophageal reflux disease)   . Hiatal hernia   . Hypertension   . Hypokalemia due to loss of potassium 10/21/2015   Overview:  Associated with 3  weeks of diarrhea  And QT prolongation.  . Hypothyroidism   . IBS (irritable bowel syndrome)   . Moderate episode of recurrent major depressive disorder ([place]) 06/03/2004  . Morbid obesity ([place])   . MRSA (methicillin resistant Staphylococcus aureus) infection 11/2017   left inner thigh abcess  . Neurogenic bladder    has pacemaker  . Neuropathy   . Obesity   . Panic attacks   . Rheumatoid arthritis ([place])   . Sleep apnea    STATES SEVERE, CANT TOLERATE MASK- LAST STUDY YEARS AGO     SURGICAL HISTORY   Past Surgical History:  Procedure Laterality Date  . ABDOMINAL HYSTERECTOMY    . CHOLECYSTECTOMY    . DG GREAT TOE RIGHT FOOT  02/23/2018  . EYE SURGERY     bilateral cataract extraction with IOL  . HERNIA REPAIR     ventral hernia with strangulation  . LAPAROSCOPIC GASTRIC BANDING  03/20/07  . TONSILLECTOMY    . TUBAL LIGATION        MEDICATIONS   Current Medication:  Current Facility-Administered Medications:  .  acetaminophen (TYLENOL) tablet 650 mg, 650 mg, Oral, Q6H PRN, Ivor Costa, MD, 650 mg at 04/10/19 0224 .  albuterol (VENTOLIN HFA) 108 (90 Base) MCG/ACT inhaler 2 puff, 2 puff, Inhalation, Q4H PRN, Ivor Costa, MD .  ALPRAZolam Duanne Moron) tablet 0.5 mg, 0.5 mg, Oral, TID PRN, Flora Lipps, MD, 0.5 mg at 04/17/19 2127 .  ascorbic acid (VITAMIN C) tablet 500 mg, 500 mg, Oral, Daily, Ivor Costa, MD, 500 mg at 04/21/19 0840 .  atorvastatin (LIPITOR) tablet 80 mg, 80 mg, Oral, q1800, Tyler Pita, MD, 80 mg  at 04/21/19 2128 .  busPIRone (BUSPAR) tablet 10 mg, 10 mg, Oral, BID, Lorretta Harp, MD, 10 mg at 04/21/19 2129 .  calcium-vitamin D (OSCAL WITH D) 500-200 MG-UNIT per tablet 1 tablet, 1 tablet, Oral, Daily, Lorretta Harp, MD, 1 tablet at 04/22/19 0827 .  Chlorhexidine Gluconate Cloth 2 % PADS 6 each, 6 each, Topical, Daily, Danford, Earl Lites, MD, 6 each at 04/21/19 0934 .  cholecalciferol (VITAMIN D3) tablet 1,000 Units, 1,000 Units, Oral, Q breakfast, Lorretta Harp,  MD, 1,000 Units at 04/21/19 [phone] .  ciclesonide (ALVESCO) 160 MCG/ACT inhaler 4 puff, 4 puff, Inhalation, BID, Salena Saner, MD, 4 puff at 04/21/19 2120 .  darifenacin (ENABLEX) 24 hr tablet 7.5 mg, 7.5 mg, Oral, Daily, Lorretta Harp, MD, 7.5 mg at 04/21/19 0844 .  dexamethasone (DECADRON) tablet 6 mg, 6 mg, Oral, Daily, Sarina Ser L, MD .  enoxaparin (LOVENOX) injection 75 mg, 0.5 mg/kg, Subcutaneous, Q12H, Vida Rigger, MD, 75 mg at 04/21/19 2246 .  famotidine (PEPCID) tablet 40 mg, 40 mg, Oral, Daily, Salena Saner, MD, 40 mg at 04/21/19 0841 .  FLUoxetine (PROZAC) capsule 20 mg, 20 mg, Oral, Daily, Lorretta Harp, MD, 20 mg at 04/21/19 0844 .  folic acid (FOLVITE) tablet 1 mg, 1 mg, Oral, Daily, Lorretta Harp, MD, 1 mg at 04/21/19 0840 .  guaiFENesin (ROBITUSSIN) 100 MG/5ML solution 100 mg, 5 mL, Oral, Q4H PRN, Migdalia Dk, MD, 100 mg at 04/22/19 0555 .  insulin aspart (novoLOG) injection 0-20 Units, 0-20 Units, Subcutaneous, TID WC, Vida Rigger, MD, 25 Units at 04/21/19 1732 .  insulin aspart (novoLOG) injection 0-5 Units, 0-5 Units, Subcutaneous, QHS, Eugenie Norrie, NP, 4 Units at 04/21/19 2127 .  insulin aspart (novoLOG) injection 14 Units, 14 Units, Subcutaneous, TID WC, Vida Rigger, MD, 14 Units at 04/22/19 0829 .  insulin glargine (LANTUS) injection 15 Units, 15 Units, Subcutaneous, QHS, Vida Rigger, MD, 15 Units at 04/21/19 2128 .  insulin glargine (LANTUS) injection 65 Units, 65 Units, Subcutaneous, Daily, Danford, Earl Lites, MD, 65 Units at 04/22/19 [phone]  levothyroxine (SYNTHROID) tablet 88 mcg, 88 mcg, Oral, QAC breakfast, Lorretta Harp, MD, 88 mcg at 04/22/19 0826 .  linagliptin (TRADJENTA) tablet 5 mg, 5 mg, Oral, Daily, Danford, Earl Lites, MD, 5 mg at 04/21/19 0844 .  lip balm (BLISTEX) ointment, , Topical, PRN, Harlon Ditty D, NP .  magic mouthwash, 15 mL, Oral, TID PRN, Salena Saner, MD, 15 mL at 04/10/19 0534 .  MEDLINE mouth rinse,  15 mL, Mouth Rinse, BID, Eugenie Norrie, NP, 15 mL at 04/21/19 2129 .  mupirocin ointment (BACTROBAN) 2 %, , Nasal, BID, Salena Saner, MD, Given at 04/21/19 2120 .  ondansetron (ZOFRAN) injection 4 mg, 4 mg, Intravenous, Q8H PRN, Lorretta Harp, MD, 4 mg at 04/08/19 1104 .  oxyCODONE (Oxy IR/ROXICODONE) immediate release tablet 5 mg, 5 mg, Oral, Q6H PRN, Lorretta Harp, MD, 5 mg at 04/22/19 0556 .  polyethylene glycol (MIRALAX / GLYCOLAX) packet 17 g, 17 g, Oral, Daily PRN, Harlon Ditty D, NP .  pramipexole (MIRAPEX) tablet 0.125 mg, 0.125 mg, Oral, BID, Lorretta Harp, MD, 0.125 mg at 04/21/19 2246 .  pregabalin (LYRICA) capsule 150 mg, 150 mg, Oral, TID, Lorretta Harp, MD, 150 mg at 04/21/19 2128 .  protein supplement (ENSURE MAX) liquid, 11 oz, Oral, BID BM, Aleskerov, Fuad, MD, 11 oz at 04/20/19 0903 .  sodium chloride flush (NS) 0.9 % injection 10-40 mL, 10-40 mL, Intracatheter, Q12H, Anaiya Wisinski,  Wallis Bamberg, MD, 10 mL at 04/21/19 2121 .  zinc sulfate capsule 220 mg, 220 mg, Oral, Daily, Lorretta Harp, MD, 220 mg at 04/21/19 9604 .  zolpidem (AMBIEN) tablet 5 mg, 5 mg, Oral, QHS, Lorretta Harp, MD, 5 mg at 04/21/19 2128    ALLERGIES   Cephalexin, Codeine, Doxycycline, Propoxyphene, Sulfa antibiotics, Lovenox [enoxaparin sodium], Hydrocodone, and Meropenem   COVID-19 DISASTER DECLARATION:   FULL CONTACT PHYSICAL EXAMINATION WAS NOT POSSIBLE DUE TO TREATMENT OF COVID-19 AND   CONSERVATION OF PERSONAL PROTECTIVE EQUIPMENT, LIMITED EXAM FINDINGS INCLUDE-   Patient assessed or the symptoms described in the history of present illness.   In the context of the Global COVID-19 pandemic, which necessitated consideration that the patient might be at risk for infection with the SARS-CoV-2 virus that causes COVID-19, Institutional protocols and algorithms that pertain to the evaluation of patients at risk for COVID-19 are in a state of rapid change based on information released by regulatory bodies including the CDC  and federal and state organizations. These policies and algorithms were followed during the patient's care while in hospital.     PHYSICAL EXAMINATION   Vital Signs: Temp:  [98.3 F (36.8 C)-99 F (37.2 C)] 98.3 F (36.8 C) (01/03 0000) Pulse Rate:  [72-109] 85 (01/03 0800) Resp:  [15-29] 24 (01/03 0800) BP: (93-121)/(54-100) 98/71 (01/03 0800) SpO2:  [80 %-95 %] 94 % (01/03 0800) FiO2 (%):  [40 %-70 %] 70 % (01/03 0800)    PERTINENT DATA     Infusions:  Scheduled Medications: . vitamin C  500 mg Oral Daily  . atorvastatin  80 mg Oral q1800  . busPIRone  10 mg Oral BID  . calcium-vitamin D  1 tablet Oral Daily  . Chlorhexidine Gluconate Cloth  6 each Topical Daily  . cholecalciferol  1,000 Units Oral Q breakfast  . ciclesonide  4 puff Inhalation BID  . darifenacin  7.5 mg Oral Daily  . dexamethasone  6 mg Oral Daily  . enoxaparin (LOVENOX) injection  0.5 mg/kg Subcutaneous Q12H  . famotidine  40 mg Oral Daily  . FLUoxetine  20 mg Oral Daily  . folic acid  1 mg Oral Daily  . insulin aspart  0-20 Units Subcutaneous TID WC  . insulin aspart  0-5 Units Subcutaneous QHS  . insulin aspart  14 Units Subcutaneous TID WC  . insulin glargine  15 Units Subcutaneous QHS  . insulin glargine  65 Units Subcutaneous Daily  . levothyroxine  88 mcg Oral QAC breakfast  . linagliptin  5 mg Oral Daily  . mouth rinse  15 mL Mouth Rinse BID  . mupirocin ointment   Nasal BID  . pramipexole  0.125 mg Oral BID  . pregabalin  150 mg Oral TID  . Ensure Max Protein  11 oz Oral BID BM  . sodium chloride flush  10-40 mL Intracatheter Q12H  . zinc sulfate  220 mg Oral Daily  . zolpidem  5 mg Oral QHS   PRN Medications: acetaminophen, albuterol, ALPRAZolam, guaiFENesin, lip balm, magic mouthwash, ondansetron (ZOFRAN) IV, oxyCODONE, polyethylene glycol Hemodynamic parameters:   Intake/Output: 01/02 0701 - 01/03 0700 In: 540 [P.O.:520; I.V.:20] Out: 1300 [Urine:1300]  Ventilator   Settings: FiO2 (%):  [40 %-70 %] 70 %    LAB RESULTS:  Basic Metabolic Panel: Recent Labs  Lab 04/17/19 0620 04/18/19 0433 04/20/19 0518 04/21/19 0501 04/22/19 0435  NA 133* 136 131* 130* 136  K 4.2 4.6 4.2 4.1 4.0  CL 99 98 96*  97* 100  CO2 23 25 23  21* 26  GLUCOSE 170* 189* 273* 361* 122*  BUN 43* 54* 67* 78* 66*  CREATININE 1.06* 1.37* 1.27* 1.33* 0.95  CALCIUM 9.1 9.4 9.1 9.0 9.2   Liver Function Tests: No results for input(s): AST, ALT, ALKPHOS, BILITOT, PROT, ALBUMIN in the last 168 hours. No results for input(s): LIPASE, AMYLASE in the last 168 hours. No results for input(s): AMMONIA in the last 168 hours. CBC: Recent Labs  Lab 04/17/19 0620 04/18/19 0433 04/20/19 0518 04/21/19 0501 04/22/19 0435  WBC 13.5* 19.3* 16.9* 20.1* 16.6*  NEUTROABS  --   --   --   --  13.5*  HGB 12.4 12.4 12.2 11.9* 12.1  HCT 39.4 39.8 38.8 38.3 36.2  MCV 82.3 82.1 82.4 81.3 78.0*  PLT 288 302 271 267 246   Cardiac Enzymes: No results for input(s): CKTOTAL, CKMB, CKMBINDEX, TROPONINI in the last 168 hours. BNP: Invalid input(s): POCBNP CBG: Recent Labs  Lab 04/21/19 0829 04/21/19 1154 04/21/19 1659 04/21/19 2106 04/22/19 0740  GLUCAP 281* 360* 471* 354* 97     IMAGING RESULTS:  Imaging: DG Chest Port 1 View  Result Date: 04/20/2019 CLINICAL DATA:  COVID-19 positive. EXAM: PORTABLE CHEST 1 VIEW COMPARISON:  April 11, 2019. FINDINGS: Stable cardiomediastinal silhouette. Stable bilateral multiple opacities are noted throughout both lungs. No pneumothorax or pleural effusion is noted. Bony thorax is unremarkable. IMPRESSION: Stable bilateral multiple lung opacities are noted concerning for multifocal pneumonia. Electronically Signed   By: April 13, 2019 M.D.   On: 04/20/2019 13:04       ASSESSMENT AND PLAN   Severe ACUTE Hypoxic and Hypercapnic Respiratory Failure Severe hypoxic from COVID 19 infection pneumonia Oxygen as needed high risk intubation   Severe  COVID-19 infection, ARDS and pneumonia/pneumonitis Lasix as tolerated Continue IV steroids  Aggressive pulm toilet recommended OOB to chair as tolerated  Pulmonary hygiene Continue proning as tolerated due to severe hypoxia   Maintain airborne and contact precautions  High risk for intubation and death  ELECTROLYTES -follow labs as needed -replace as needed -pharmacy consultation and following  ENDO - ICU hypoglycemic\Hyperglycemia protocol -check FSBS per protocol    DVT/GI PRX ordered TRANSFUSIONS AS NEEDED MONITOR FSBS ASSESS the need for LABS as needed     Critical Care Time devoted to patient care services described in this note is 34 minutes.   Overall, patient is critically ill, prognosis is guarded.    06/18/2019, M.D.  [facility]  Medical Director [name] Medical Director [facility] Cardio-Pulmonary Department

## 2019-04-22 NOTE — Progress Notes (Signed)
MEDICATION RELATED CONSULT NOTE - FOLLOW UP   Pharmacy Consult for Insulin regimen   Allergies  Allergen Reactions  . Cephalexin Hives  . Codeine Palpitations, Nausea Only, Nausea And Vomiting, Rash and Shortness Of Breath    "makes heart fly, she gets flushed and passes out"  . Doxycycline Rash  . Propoxyphene Rash and Shortness Of Breath    Increase heart rate  . Sulfa Antibiotics Palpitations, Nausea Only, Shortness Of Breath and Hives    "makes heart fly, she gets flushed and passes out"  . Lovenox [Enoxaparin Sodium] Hives  . Hydrocodone Nausea And Vomiting    Hear racing & breaks out into a cold sweat.  . Meropenem Rash    Erythematous, hot, pruritic rash over arms, chest, back, abdomen, and face occurred at the end of meropenem infusion on 02/22/18    Patient Measurements: Height: 5\' 9"  (175.3 cm) Weight: (!) 320 lb (145.2 kg) IBW/kg (Calculated) : 66.2   Vital Signs: Temp: 98.3 F (36.8 C) (01/03 0000) BP: 98/71 (01/03 0800) Pulse Rate: 85 (01/03 0800) Intake/Output from previous day: 01/02 0701 - 01/03 0700 In: 540 [P.O.:520; I.V.:20] Out: 1300 [Urine:1300] Intake/Output from this shift: Total I/O In: -  Out: 850 [Urine:850]   Assessment: Lantus 65 units daily + lantus 15 units at bedtime Novolog 14 TID w/ meals SSI 0-20 q4h linagliptin  Methylpred 40 mg IV q12h since 12/22 - d/c'ed 1/2.   1/1  Per Diabetes Coordinator recommendation: Will add Lantus 5 units at bedtime Will increase Novolog to 10 units TID w/ meals   Plan:  BG 97-122. Will decrease meal time insulin to 10 units a 20% decrease from previous dose. methylpred d/c on 1/2.  Will continue previous regimen.  Will follow up in AM to assess trend  1/23, PharmD, BCPS 04/22/2019,9:06 AM

## 2019-04-22 NOTE — Plan of Care (Signed)
  Problem: Education: Goal: Knowledge of General Education information will improve Description: Including pain rating scale, medication(s)/side effects and non-pharmacologic comfort measures Outcome: Not Progressing   Problem: Health Behavior/Discharge Planning: Goal: Ability to manage health-related needs will improve Outcome: Not Progressing   Problem: Clinical Measurements: Goal: Ability to maintain clinical measurements within normal limits will improve Outcome: Not Progressing Goal: Will remain free from infection Outcome: Not Progressing Goal: Diagnostic test results will improve Outcome: Not Progressing Goal: Respiratory complications will improve Outcome: Not Progressing Goal: Cardiovascular complication will be avoided Outcome: Not Progressing   Problem: Activity: Goal: Risk for activity intolerance will decrease Outcome: Not Progressing   Problem: Nutrition: Goal: Adequate nutrition will be maintained Outcome: Not Progressing   Problem: Coping: Goal: Level of anxiety will decrease Outcome: Not Progressing   Problem: Elimination: Goal: Will not experience complications related to bowel motility Outcome: Not Progressing Goal: Will not experience complications related to urinary retention Outcome: Not Progressing   Problem: Pain Managment: Goal: General experience of comfort will improve Outcome: Not Progressing   Problem: Safety: Goal: Ability to remain free from injury will improve Outcome: Not Progressing   Problem: Skin Integrity: Goal: Risk for impaired skin integrity will decrease Outcome: Not Progressing   Problem: Education: Goal: Knowledge of risk factors and measures for prevention of condition will improve Outcome: Not Progressing   Problem: Coping: Goal: Psychosocial and spiritual needs will be supported Outcome: Not Progressing   Problem: Respiratory: Goal: Will maintain a patent airway Outcome: Not Progressing Goal: Complications  related to the disease process, condition or treatment will be avoided or minimized Outcome: Not Progressing   

## 2019-04-23 DIAGNOSIS — E039 Hypothyroidism, unspecified: Secondary | ICD-10-CM

## 2019-04-23 DIAGNOSIS — Z8614 Personal history of Methicillin resistant Staphylococcus aureus infection: Secondary | ICD-10-CM

## 2019-04-23 DIAGNOSIS — U071 COVID-19: Principal | ICD-10-CM

## 2019-04-23 DIAGNOSIS — I110 Hypertensive heart disease with heart failure: Secondary | ICD-10-CM

## 2019-04-23 DIAGNOSIS — I5030 Unspecified diastolic (congestive) heart failure: Secondary | ICD-10-CM

## 2019-04-23 DIAGNOSIS — Z89421 Acquired absence of other right toe(s): Secondary | ICD-10-CM

## 2019-04-23 DIAGNOSIS — Z89411 Acquired absence of right great toe: Secondary | ICD-10-CM

## 2019-04-23 DIAGNOSIS — Z888 Allergy status to other drugs, medicaments and biological substances status: Secondary | ICD-10-CM

## 2019-04-23 DIAGNOSIS — M069 Rheumatoid arthritis, unspecified: Secondary | ICD-10-CM

## 2019-04-23 DIAGNOSIS — Z881 Allergy status to other antibiotic agents status: Secondary | ICD-10-CM

## 2019-04-23 DIAGNOSIS — Z885 Allergy status to narcotic agent status: Secondary | ICD-10-CM

## 2019-04-23 DIAGNOSIS — Z8739 Personal history of other diseases of the musculoskeletal system and connective tissue: Secondary | ICD-10-CM

## 2019-04-23 DIAGNOSIS — J9601 Acute respiratory failure with hypoxia: Secondary | ICD-10-CM

## 2019-04-23 DIAGNOSIS — Z87891 Personal history of nicotine dependence: Secondary | ICD-10-CM

## 2019-04-23 LAB — COMPREHENSIVE METABOLIC PANEL
ALT: 11 U/L (ref 0–44)
AST: 11 U/L — ABNORMAL LOW (ref 15–41)
Albumin: 2.6 g/dL — ABNORMAL LOW (ref 3.5–5.0)
Alkaline Phosphatase: 53 U/L (ref 38–126)
Anion gap: 7 (ref 5–15)
BUN: 62 mg/dL — ABNORMAL HIGH (ref 6–20)
CO2: 29 mmol/L (ref 22–32)
Calcium: 9.1 mg/dL (ref 8.9–10.3)
Chloride: 97 mmol/L — ABNORMAL LOW (ref 98–111)
Creatinine, Ser: 0.9 mg/dL (ref 0.44–1.00)
GFR calc Af Amer: >60 mL/min (ref >60)
GFR calc non Af Amer: >60 mL/min (ref >60)
Glucose, Bld: 181 mg/dL — ABNORMAL HIGH (ref 70–99)
Potassium: 4.2 mmol/L (ref 3.5–5.1)
Sodium: 133 mmol/L — ABNORMAL LOW (ref 135–145)
Total Bilirubin: 0.8 mg/dL (ref 0.3–1.2)
Total Protein: 7.3 g/dL (ref 6.5–8.1)

## 2019-04-23 LAB — CBC WITH DIFFERENTIAL/PLATELET
Abs Immature Granulocytes: 0.16 10*3/uL — ABNORMAL HIGH (ref 0.00–0.07)
Basophils Absolute: 0 10*3/uL (ref 0.0–0.1)
Basophils Relative: 0 %
Eosinophils Absolute: 0 10*3/uL (ref 0.0–0.5)
Eosinophils Relative: 0 %
HCT: 35.4 % — ABNORMAL LOW (ref 36.0–46.0)
Hemoglobin: 11.5 g/dL — ABNORMAL LOW (ref 12.0–15.0)
Immature Granulocytes: 2 %
Lymphocytes Relative: 7 %
Lymphs Abs: 0.7 10*3/uL (ref 0.7–4.0)
MCH: 25.7 pg — ABNORMAL LOW (ref 26.0–34.0)
MCHC: 32.5 g/dL (ref 30.0–36.0)
MCV: 79.2 fL — ABNORMAL LOW (ref 80.0–100.0)
Monocytes Absolute: 0.6 10*3/uL (ref 0.1–1.0)
Monocytes Relative: 6 %
Neutro Abs: 8.6 10*3/uL — ABNORMAL HIGH (ref 1.7–7.7)
Neutrophils Relative %: 85 %
Platelets: 213 10*3/uL (ref 150–400)
RBC: 4.47 MIL/uL (ref 3.87–5.11)
RDW: 17.2 % — ABNORMAL HIGH (ref 11.5–15.5)
WBC: 10.1 10*3/uL (ref 4.0–10.5)
nRBC: 0 % (ref 0.0–0.2)

## 2019-04-23 LAB — GLUCOSE, CAPILLARY
Glucose-Capillary: 149 mg/dL — ABNORMAL HIGH (ref 70–99)
Glucose-Capillary: 203 mg/dL — ABNORMAL HIGH (ref 70–99)
Glucose-Capillary: 321 mg/dL — ABNORMAL HIGH (ref 70–99)
Glucose-Capillary: 368 mg/dL — ABNORMAL HIGH (ref 70–99)

## 2019-04-23 MED ORDER — INSULIN ASPART 100 UNIT/ML ~~LOC~~ SOLN
12.0000 [IU] | Freq: Three times a day (TID) | SUBCUTANEOUS | Status: DC
  Administered 2019-04-23 – 2019-04-24 (×3): 12 [IU] via SUBCUTANEOUS
  Filled 2019-04-23 (×3): qty 1

## 2019-04-23 NOTE — Progress Notes (Signed)
CRITICAL CARE PROGRESS NOTE    Name: [name] [name] MRN: [id number] DOB: 1960-11-22     LOS: 17   SUBJECTIVE FINDINGS & SIGNIFICANT EVENTS   Patient description:  [name] [name] an 59 y.o.femaleformer smoker with a history of diabetes mellitus, morbid obesity and rheumatoid arthritis diagnosed with COVID-19 on December 4. Admitted to [facility] from 8 December through 12 December received 5 days of remdesivir. Also received dexamethasone. She has been admitted with ARDS in the setting of delayed pulmonary response from COVID-19.  Lines / Drains: PIVx2  Cultures / Sepsis markers: Blood culture X1 positive for staph epi with resistance - possibly contaminant  Antibiotics: None    Protocols / Consultants: Hospitalist/pccm   Tests / Events: 12/28-Weaned down to  75%FiO2, mentation improving.  12/29- clinically imporved, diet advanced, heparin decreased per PTT 12/30 -HFNC75%, self proning 12/31 - continue to wean O2,monitor vitals, patient hypotenisve this am  1/1 - patient smiling this am , eating breakfast, no distress, plan to continue weaning fio2 on high flow 1/2 - no overnight events  1/3 severe hypoxia persists 1/4: participating with PT, O2,flow requirements decreasing  CC Follow up resp failure with hypoxia  Subjective Interactive, continues to anticipate with PT, no complaints, no dyspnea improving    MEDICATIONS   . vitamin C  500 mg Oral Daily  . atorvastatin  80 mg Oral q1800  . busPIRone  10 mg Oral BID  . calcium-vitamin D  1 tablet Oral Daily  . Chlorhexidine Gluconate Cloth  6 each Topical Daily  . cholecalciferol  1,000 Units Oral Q breakfast  . ciclesonide  4 puff Inhalation BID  . darifenacin  7.5 mg Oral Daily  . dexamethasone  6 mg Oral Daily  .  enoxaparin (LOVENOX) injection  0.5 mg/kg Subcutaneous Q12H  . famotidine  40 mg Oral Daily  . FLUoxetine  20 mg Oral Daily  . folic acid  1 mg Oral Daily  . insulin aspart  0-20 Units Subcutaneous TID WC  . insulin aspart  0-5 Units Subcutaneous QHS  . insulin aspart  12 Units Subcutaneous TID WC  . insulin glargine  15 Units Subcutaneous QHS  . insulin glargine  65 Units Subcutaneous Daily  . levothyroxine  88 mcg Oral QAC breakfast  . linagliptin  5 mg Oral Daily  . mouth rinse  15 mL Mouth Rinse BID  . mupirocin ointment   Nasal BID  . pramipexole  0.125 mg Oral BID  . pregabalin  150 mg Oral TID  . Ensure Max Protein  11 oz Oral BID BM  . sodium chloride flush  10-40 mL Intracatheter Q12H  . zinc sulfate  220 mg Oral Daily  . zolpidem  5 mg Oral QHS   PRN Medications: acetaminophen, albuterol, ALPRAZolam, guaiFENesin, lip balm, magic mouthwash, ondansetron (ZOFRAN) IV, oxyCODONE, polyethylene glycol   ALLERGIES   Cephalexin, Codeine, Doxycycline, Propoxyphene, Sulfa antibiotics, Lovenox [enoxaparin sodium], Hydrocodone, and Meropenem   COVID-19 DISASTER DECLARATION:   FULL CONTACT PHYSICAL EXAMINATION WAS NOT POSSIBLE DUE TO TREATMENT OF COVID-19 AND   CONSERVATION OF PERSONAL PROTECTIVE EQUIPMENT, LIMITED EXAM FINDINGS INCLUDE- No edema No respiratory distress Tolerating FiO2 titration Or precludes full pulmonary exam   Patient assessed or the symptoms described in the history of present illness.   In the context of the Global COVID-19 pandemic, which necessitated consideration that the patient might be at risk for infection with the SARS-CoV-2 virus that causes COVID-19, Institutional protocols and algorithms that  pertain to the evaluation of patients at risk for COVID-19 are in a state of rapid change based on information released by regulatory bodies including the CDC and federal and state organizations. These policies and algorithms were followed during the  patient's care while in hospital.     PHYSICAL EXAMINATION   Vital Signs: Temp:  [97 F (36.1 C)-97.8 F (36.6 C)] 97.8 F (36.6 C) (01/04 2100) Pulse Rate:  [75-101] 101 (01/04 2100) Resp:  [15-25] 18 (01/04 2100) BP: (74-148)/(53-120) 148/120 (01/04 2100) SpO2:  [89 %-99 %] 89 % (01/04 2100) FiO2 (%):  [60 %] 60 % (01/04 2019)  Hemodynamic parameters:   Intake/Output: 01/03 0701 - 01/04 0700 In: 960 [P.O.:960] Out: 1760 [Urine:1760]  Ventilator  Settings: FiO2 (%):  [60 %] 60 %    LAB RESULTS:  Basic Metabolic Panel: Recent Labs  Lab 04/18/19 0433 04/20/19 0518 04/21/19 0501 04/22/19 0435 04/23/19 0526  NA 136 131* 130* 136 133*  K 4.6 4.2 4.1 4.0 4.2  CL 98 96* 97* 100 97*  CO2 25 23 21* 26 29  GLUCOSE 189* 273* 361* 122* 181*  BUN 54* 67* 78* 66* 62*  CREATININE 1.37* 1.27* 1.33* 0.95 0.90  CALCIUM 9.4 9.1 9.0 9.2 9.1   Liver Function Tests: Recent Labs  Lab 04/23/19 0526  AST 11*  ALT 11  ALKPHOS 53  BILITOT 0.8  PROT 7.3  ALBUMIN 2.6*   No results for input(s): LIPASE, AMYLASE in the last 168 hours. No results for input(s): AMMONIA in the last 168 hours. CBC: Recent Labs  Lab 04/18/19 0433 04/20/19 0518 04/21/19 0501 04/22/19 0435 04/23/19 0526  WBC 19.3* 16.9* 20.1* 16.6* 10.1  NEUTROABS  --   --   --  13.5* 8.6*  HGB 12.4 12.2 11.9* 12.1 11.5*  HCT 39.8 38.8 38.3 36.2 35.4*  MCV 82.1 82.4 81.3 78.0* 79.2*  PLT 302 271 267 246 213   Cardiac Enzymes: No results for input(s): CKTOTAL, CKMB, CKMBINDEX, TROPONINI in the last 168 hours. BNP: Invalid input(s): POCBNP CBG: Recent Labs  Lab 04/22/19 2126 04/23/19 0831 04/23/19 1228 04/23/19 1631 04/23/19 2135  GLUCAP 297* 149* 203* 321* 368*     IMAGING RESULTS:  Imaging: No results found.    ASSESSMENT AND PLAN   Acute hypoxic respiratory failure Late pulmonary phase of COVID-19 disease Bilateral pulmonary infiltrates Low suspicion for bacterial infection as  procalcitonin and clinical picture not suggestive  High flow O2 to maintain oxygen saturations between 88 to 92% Continue anti-inflammatory therapy On dexamethasone 6 mg daily Supplement magnesium if needed SupplementZinc and Thiamine, continue vitamin C Close monitoring in stepdown/ICU High risk for intubation  Hypercoagulable state No evidence of large PE Cannot exclude microthrombi Switch to Lovenox, monitor D-dimer Anti-inflammatory therapy as above  Diabetes mellitus type 2 Letter controlled Subcu insulin Close monitoring  Acute kidney injury Improving Lasix as needed Follow renal panel Monitor I's and O's  Rheumatoid arthritis Recent Actemra dosing This issue adds complexity to her management Actemra discontinued 12/18 by her rheumatologist Received last Actemra dose in week PTA  Morbid obesity BMI 52 This issue adds complexity to her management  Will need isolation for 21 days post admission which will be 8 January  C. Derrill Kay, MD [facility]   *This note was dictated using voice recognition software/Dragon.  Despite best efforts to proofread, errors can occur which can change the meaning.  Any change was purely unintentional.

## 2019-04-23 NOTE — Plan of Care (Signed)
  Problem: Education: Goal: Knowledge of General Education information will improve Description: Including pain rating scale, medication(s)/side effects and non-pharmacologic comfort measures Outcome: Not Progressing   Problem: Health Behavior/Discharge Planning: Goal: Ability to manage health-related needs will improve Outcome: Not Progressing   Problem: Clinical Measurements: Goal: Ability to maintain clinical measurements within normal limits will improve Outcome: Not Progressing Goal: Will remain free from infection Outcome: Not Progressing Goal: Diagnostic test results will improve Outcome: Not Progressing Goal: Respiratory complications will improve Outcome: Not Progressing Goal: Cardiovascular complication will be avoided Outcome: Not Progressing   Problem: Activity: Goal: Risk for activity intolerance will decrease Outcome: Not Progressing   Problem: Nutrition: Goal: Adequate nutrition will be maintained Outcome: Not Progressing   Problem: Coping: Goal: Level of anxiety will decrease Outcome: Not Progressing   Problem: Elimination: Goal: Will not experience complications related to bowel motility Outcome: Not Progressing Goal: Will not experience complications related to urinary retention Outcome: Not Progressing   Problem: Pain Managment: Goal: General experience of comfort will improve Outcome: Not Progressing   Problem: Safety: Goal: Ability to remain free from injury will improve Outcome: Not Progressing   Problem: Skin Integrity: Goal: Risk for impaired skin integrity will decrease Outcome: Not Progressing   Problem: Education: Goal: Knowledge of risk factors and measures for prevention of condition will improve Outcome: Not Progressing   Problem: Coping: Goal: Psychosocial and spiritual needs will be supported Outcome: Not Progressing   Problem: Respiratory: Goal: Will maintain a patent airway Outcome: Not Progressing Goal: Complications  related to the disease process, condition or treatment will be avoided or minimized Outcome: Not Progressing   

## 2019-04-23 NOTE — Progress Notes (Addendum)
Shift summary:  - No acute changes this shift.  - Foley catheter removed. PureWick in place.

## 2019-04-23 NOTE — Progress Notes (Signed)
MEDICATION RELATED CONSULT NOTE    Pharmacy Consult for Insulin regimen   Patient Measurements: Height: 5\' 9"  (175.3 cm) Weight: (!) 320 lb (145.2 kg) IBW/kg (Calculated) : 66.2   Vital Signs: Temp: 97.1 F (36.2 C) (01/04 1200) Temp Source: Axillary (01/04 1200) BP: 92/60 (01/04 1400) Pulse Rate: 81 (01/04 1300) Intake/Output from previous day: 01/03 0701 - 01/04 0700 In: 960 [P.O.:960] Out: 1760 [Urine:1760] Intake/Output from this shift: Total I/O In: 970 [P.O.:960; I.V.:10] Out: 610 [Urine:610]   Assessment: Lantus 65 units daily + lantus 15 units at bedtime Novolog 10 units TID w/ meals SSI 0-20 q4h linagliptin  Methylpred 40 mg IV q12h since 12/22 - d/c'ed 1/2 & changed to dexamethasone 6 mg po daily  BG range over the previous 24h:  97-297  Plan:   Increase meal time insulin to 12 units   continue previous remaining insulin, linaglipitin regimen  follow up in AM to assess trend  1/23, PharmD, BCPS 04/23/2019,3:12 PM

## 2019-04-23 NOTE — Consult Note (Signed)
NAME: [name] [name]  DOB: [birth year]  MRN: [id number]  Date/Time: 04/23/2019 3:21 PM  REQUESTING PROVIDER: Dr.Gonzalez Subjective:  REASON FOR CONSULT: COVID lung infiltrate ? CATOYA FEMRITE is a 59 y.o. with a history of HTN, Rheumatoid arthritis, HTN, diastolic HF, hypothyroidism  Diagnosed with Covid 19 illness  on 12/4 Was in GVC between 03/27/19-03/31/19 and received 5 days of Remdisivir and Dexa 10 day course Presented on 04/06/19 to [facility] with SOB She was admitted to ICU and seen by pulmonologist and started on colchicine, ivermectin, zinc, thiamine, heparin infusion, high flo oxygen, solumedrol ( which is now switched to dexamethasone) I am seeing the patient for airborne isolation   She has h/o rheumatoid arthritis and has been in the past on methotrexate/actemra/leflunamide  which she has not received since the diagnosis of COVID  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 Abdominal wall hernia 01/29/2013  . Anxiety   . Arthritis    Rheumatoid  . C. difficile colitis   . Chronic diastolic heart failure (HCC)   . Depression   . Diabetes mellitus    states no meds or diet restrictions  at present  . Diastolic CHF (HCC)   . Esophagitis   . Fluid retention   . GERD (gastroesophageal reflux disease)   . Hiatal hernia   . Hypertension   . Hypokalemia due to loss of potassium 10/21/2015   Overview:  Associated with 3 weeks of diarrhea  And QT prolongation.  . Hypothyroidism   . IBS (irritable bowel syndrome)   . Moderate episode of recurrent major depressive disorder (HCC) 06/03/2004  . Morbid obesity (HCC)   . MRSA (methicillin resistant Staphylococcus aureus) infection 11/2017   left inner thigh abcess  . Neurogenic bladder    has pacemaker  . Neuropathy   . Obesity   . Panic attacks   .  Rheumatoid arthritis (HCC)   . Sleep apnea    STATES SEVERE, CANT TOLERATE MASK- LAST STUDY YEARS AGO    Past Surgical History:  Procedure Laterality Date  . ABDOMINAL HYSTERECTOMY    . CHOLECYSTECTOMY    . DG GREAT TOE RIGHT FOOT  02/23/2018  . EYE SURGERY     bilateral cataract extraction with IOL  . HERNIA REPAIR     ventral hernia with strangulation  . LAPAROSCOPIC GASTRIC BANDING  03/20/07  . TONSILLECTOMY    . TUBAL LIGATION      Social History   Socioeconomic History  . Marital status: Divorced    Spouse name: Not on file  . Number of children: Not on file  . Years of education: Not on file  . Highest education level: Not on file  Occupational History  . Not on file  Tobacco Use  . Smoking status: Former Smoker    Packs/day: 2.00    Years: 27.00    Pack years: 54.00    Types: Cigarettes    Quit date: 07/30/1999    Years since quitting: 19.7  . Smokeless tobacco: Never Used  Substance and Sexual Activity  . Alcohol use: No  . Drug use: No  . Sexual activity: Not Currently  Other Topics Concern  . Not on file  Social History Narrative  . Not on file   Social Determinants of  Health   Financial Resource Strain:   . Difficulty of Paying Living Expenses: Not on file  Food Insecurity:   . Worried About Charity fundraiser in the Last Year: Not on file  . Ran Out of Food in the Last Year: Not on file  Transportation Needs:   . Lack of Transportation (Medical): Not on file  . Lack of Transportation (Non-Medical): Not on file  Physical Activity:   . Days of Exercise per Week: Not on file  . Minutes of Exercise per Session: Not on file  Stress:   . Feeling of Stress : Not on file  Social Connections:   . Frequency of Communication with Friends and Family: Not on file  . Frequency of Social Gatherings with Friends and Family: Not on file  . Attends Religious Services: Not on file  . Active Member of Clubs or Organizations: Not on file  . Attends Theatre manager Meetings: Not on file  . Marital Status: Not on file  Intimate Partner Violence:   . Fear of Current or Ex-Partner: Not on file  . Emotionally Abused: Not on file  . Physically Abused: Not on file  . Sexually Abused: Not on file    Family History  Problem Relation Age of Onset  . Heart failure Father   . Bipolar disorder Father   . Alcohol abuse Father   . Anxiety disorder Father   . Depression Father   . Heart disease Brother   . Heart attack Brother 46       MI s/p stents placed  . Anxiety disorder Sister   . Depression Sister   . Anxiety disorder Sister   . Depression Sister   . Bipolar disorder Sister   . Alcohol abuse Sister   . Drug abuse Sister   . Heart attack Brother    Allergies  Allergen Reactions  . Cephalexin Hives  . Codeine Palpitations, Nausea Only, Nausea And Vomiting, Rash and Shortness Of Breath    "makes heart fly, she gets flushed and passes out"  . Doxycycline Rash  . Propoxyphene Rash and Shortness Of Breath    Increase heart rate  . Sulfa Antibiotics Palpitations, Nausea Only, Shortness Of Breath and Hives    "makes heart fly, she gets flushed and passes out"  . Lovenox [Enoxaparin Sodium] Hives  . Hydrocodone Nausea And Vomiting    Hear racing & breaks out into a cold sweat.  . Meropenem Rash    Erythematous, hot, pruritic rash over arms, chest, back, abdomen, and face occurred at the end of meropenem infusion on 02/22/18    ? Current Facility-Administered Medications  Medication Dose Route Frequency Provider Last Rate Last Admin  . acetaminophen (TYLENOL) tablet 650 mg  650 mg Oral Q6H PRN Ivor Costa, MD   650 mg at 04/10/19 0224  . albuterol (VENTOLIN HFA) 108 (90 Base) MCG/ACT inhaler 2 puff  2 puff Inhalation Q4H PRN Ivor Costa, MD      . ALPRAZolam Duanne Moron) tablet 0.5 mg  0.5 mg Oral TID PRN Flora Lipps, MD   0.5 mg at 04/17/19 2127  . ascorbic acid (VITAMIN C) tablet 500 mg  500 mg Oral Daily Ivor Costa, MD   500 mg at  04/23/19 0955  . atorvastatin (LIPITOR) tablet 80 mg  80 mg Oral q1800 Tyler Pita, MD   80 mg at 04/22/19 2129  . busPIRone (BUSPAR) tablet 10 mg  10 mg Oral BID Ivor Costa, MD   10  mg at 04/23/19 0956  . calcium-vitamin D (OSCAL WITH D) 500-200 MG-UNIT per tablet 1 tablet  1 tablet Oral Daily Lorretta Harp, MD   1 tablet at 04/23/19 [phone]  . Chlorhexidine Gluconate Cloth 2 % PADS 6 each  6 each Topical Daily Danford, Earl Lites, MD   6 each at 04/22/19 2218  . cholecalciferol (VITAMIN D3) tablet 1,000 Units  1,000 Units Oral Q breakfast Lorretta Harp, MD   1,000 Units at 04/23/19 0955  . ciclesonide (ALVESCO) 160 MCG/ACT inhaler 4 puff  4 puff Inhalation BID Salena Saner, MD   4 puff at 04/23/19 0954  . darifenacin (ENABLEX) 24 hr tablet 7.5 mg  7.5 mg Oral Daily Lorretta Harp, MD   7.5 mg at 04/23/19 0955  . dexamethasone (DECADRON) tablet 6 mg  6 mg Oral Daily Salena Saner, MD   6 mg at 04/23/19 0955  . enoxaparin (LOVENOX) injection 75 mg  0.5 mg/kg Subcutaneous Q12H Vida Rigger, MD   75 mg at 04/23/19 1227  . famotidine (PEPCID) tablet 40 mg  40 mg Oral Daily Salena Saner, MD   40 mg at 04/23/19 0954  . FLUoxetine (PROZAC) capsule 20 mg  20 mg Oral Daily Lorretta Harp, MD   20 mg at 04/23/19 0955  . folic acid (FOLVITE) tablet 1 mg  1 mg Oral Daily Lorretta Harp, MD   1 mg at 04/23/19 0955  . guaiFENesin (ROBITUSSIN) 100 MG/5ML solution 100 mg  5 mL Oral Q4H PRN Migdalia Dk, MD   100 mg at 04/23/19 0833  . insulin aspart (novoLOG) injection 0-20 Units  0-20 Units Subcutaneous TID WC Vida Rigger, MD   7 Units at 04/23/19 1232  . insulin aspart (novoLOG) injection 0-5 Units  0-5 Units Subcutaneous QHS Eugenie Norrie, NP   3 Units at 04/22/19 2127  . insulin aspart (novoLOG) injection 12 Units  12 Units Subcutaneous TID WC Lowella Bandy, RPH      . insulin glargine (LANTUS) injection 15 Units  15 Units Subcutaneous QHS Vida Rigger, MD   15 Units at 04/22/19 2129    . insulin glargine (LANTUS) injection 65 Units  65 Units Subcutaneous Daily Alberteen Sam, MD   65 Units at 04/23/19 0956  . levothyroxine (SYNTHROID) tablet 88 mcg  88 mcg Oral QAC breakfast Lorretta Harp, MD   88 mcg at 04/23/19 [phone]  . linagliptin (TRADJENTA) tablet 5 mg  5 mg Oral Daily Danford, Earl Lites, MD   5 mg at 04/23/19 0955  . lip balm (BLISTEX) ointment   Topical PRN Harlon Ditty D, NP      . magic mouthwash  15 mL Oral TID PRN Salena Saner, MD   15 mL at 04/10/19 0534  . MEDLINE mouth rinse  15 mL Mouth Rinse BID Eugenie Norrie, NP   15 mL at 04/22/19 2130  . mupirocin ointment (BACTROBAN) 2 %   Nasal BID Salena Saner, MD   Given at 04/23/19 1002  . ondansetron (ZOFRAN) injection 4 mg  4 mg Intravenous Q8H PRN Lorretta Harp, MD   4 mg at 04/08/19 1104  . oxyCODONE (Oxy IR/ROXICODONE) immediate release tablet 5 mg  5 mg Oral Q6H PRN Lorretta Harp, MD   5 mg at 04/22/19 2129  . polyethylene glycol (MIRALAX / GLYCOLAX) packet 17 g  17 g Oral Daily PRN Harlon Ditty D, NP      . pramipexole (MIRAPEX) tablet 0.125 mg  0.125 mg  Oral BID Lorretta Harp, MD   0.125 mg at 04/23/19 0955  . pregabalin (LYRICA) capsule 150 mg  150 mg Oral TID Lorretta Harp, MD   150 mg at 04/23/19 0955  . protein supplement (ENSURE MAX) liquid  11 oz Oral BID BM Vida Rigger, MD   11 oz at 04/20/19 0903  . sodium chloride flush (NS) 0.9 % injection 10-40 mL  10-40 mL Intracatheter Q12H Erin Fulling, MD   10 mL at 04/23/19 1002  . zinc sulfate capsule 220 mg  220 mg Oral Daily Lorretta Harp, MD   220 mg at 04/23/19 1696  . zolpidem (AMBIEN) tablet 5 mg  5 mg Oral QHS Lorretta Harp, MD   5 mg at 04/22/19 2129     Abtx:  Anti-infectives (From admission, onward)   Start     Dose/Rate Route Frequency Ordered Stop   04/10/19 0500  ceFEPIme (MAXIPIME) 2 g in sodium chloride 0.9 % 100 mL IVPB  Status:  Discontinued     2 g 200 mL/hr over 30 Minutes Intravenous Every 8 hours 04/10/19 0414 04/12/19  1012   04/08/19 0500  fluconazole (DIFLUCAN) tablet 150 mg     150 mg Oral  Once 04/08/19 0453 04/08/19 0609   04/07/19 1545  ivermectin (STROMECTOL) tablet 28,500 mcg     200 mcg/kg  145.2 kg Oral  Once 04/07/19 1540 04/07/19 2048   04/06/19 2300  ivermectin (STROMECTOL) tablet 18,000 mcg     200 mcg/kg  93.7 kg (Adjusted) Oral  Once 04/06/19 2008 04/06/19 2356   04/06/19 1115  levofloxacin (LEVAQUIN) IVPB 750 mg     750 mg 100 mL/hr over 90 Minutes Intravenous  Once 04/06/19 1108 04/06/19 1346   04/06/19 1115  vancomycin (VANCOCIN) IVPB 1000 mg/200 mL premix     1,000 mg 200 mL/hr over 60 Minutes Intravenous  Once 04/06/19 1108 04/06/19 1550      REVIEW OF SYSTEMS:  Const: negative fever ( since admission) , negative chills, negative weight loss Eyes: negative diplopia or visual changes, negative eye pain ENT: negative coryza, negative sore throat Resp: cough,, dyspnea Cards: negative for chest pain, palpitations, lower extremity edema GU: negative for frequency, dysuria and hematuria GI: Negative for abdominal pain, diarrhea, bleeding, constipation Skin: negative for rash and pruritus Heme: negative for easy bruising and gum/nose bleeding MS: generalized weakness Neurolo:negative for headaches, dizziness, vertigo, memory problems  Psych: negative for feelings of anxiety, depression  Endocrine: sugar not well controlled due to steroids Allergy/Immunology- as above Objective:  VITALS:  BP (!) 83/56   Pulse 81   Temp (!) 97.1 F (36.2 C) (Axillary)   Resp (!) 23   Ht 5\' 9"  (1.753 m)   Wt (!) 145.2 kg   LMP 04/20/2001   SpO2 96%   BMI 47.26 kg/m  PHYSICAL EXAM:  General: Alert, cooperative, pale, > BMI, has resp distress on movt or talking Hi flo Oxygen  Head: Normocephalic, without obvious abnormality, atraumatic. Eyes: Conjunctivae clear, anicteric sclerae. Pupils are equal ENT did not examine Has hiflo [place] Neck:did not examine Back: No CVA tenderness. Lungs:  b/l air entry- crepts Tachycardia Abdomen: Soft, non-tender,not distended.  Extremities:bruising arms- no edema or cyanosis toes Skin: No rashes or lesions. Or bruising Lymph: Cervical, supraclavicular normal. Neurologic: Grossly non-focal Pertinent Labs Lab Results CBC    Component Value Date/Time   WBC 10.1 04/23/2019 0526   RBC 4.47 04/23/2019 0526   HGB 11.5 (L) 04/23/2019 0526   HGB 12.1 05/27/2014 1439  HCT 35.4 (L) 04/23/2019 0526   HCT 37.5 05/27/2014 1439   PLT 213 04/23/2019 0526   PLT 221 05/27/2014 1439   MCV 79.2 (L) 04/23/2019 0526   MCV 80 05/27/2014 1439   MCH 25.7 (L) 04/23/2019 0526   MCHC 32.5 04/23/2019 0526   RDW 17.2 (H) 04/23/2019 0526   RDW 17.3 (H) 05/27/2014 1439   LYMPHSABS 0.7 04/23/2019 0526   LYMPHSABS 1.6 12/03/2013 0511   MONOABS 0.6 04/23/2019 0526   MONOABS 0.5 12/03/2013 0511   EOSABS 0.0 04/23/2019 0526   EOSABS 0.0 12/03/2013 0511   BASOSABS 0.0 04/23/2019 0526   BASOSABS 0.1 12/03/2013 0511    CMP Latest Ref Rng & Units 04/23/2019 04/22/2019 04/21/2019  Glucose 70 - 99 mg/dL 810(F) 751(W) 258(N)  BUN 6 - 20 mg/dL 27(P) 82(U) 23(N)  Creatinine 0.44 - 1.00 mg/dL 3.61 4.43 1.54(M)  Sodium 135 - 145 mmol/L 133(L) 136 130(L)  Potassium 3.5 - 5.1 mmol/L 4.2 4.0 4.1  Chloride 98 - 111 mmol/L 97(L) 100 97(L)  CO2 22 - 32 mmol/L 29 26 21(L)  Calcium 8.9 - 10.3 mg/dL 9.1 9.2 9.0  Total Protein 6.5 - 8.1 g/dL 7.3 - -  Total Bilirubin 0.3 - 1.2 mg/dL 0.8 - -  Alkaline Phos 38 - 126 U/L 53 - -  AST 15 - 41 U/L 11(L) - -  ALT 0 - 44 U/L 11 - -    04/12/19- procal < 0.10 CRP 7.9 Microbiology: No results found for this or any previous visit (from the past 240 hour(s)).  IMAGING RESULTS:  I have personally reviewed the films  ? Impression/Recommendation ? ARMANIE ULLMER is a 59 y.o. with a history of HTN, Rheumatoid arthritis, HTN, diastolic HF, hypothyroidism  Diagnosed with Covid 19 illness  on 12/4 ?received 5 days of remdisivir  and decadron 12/8-12/12  Acute hypoxic respiratory failure due to b/l pulmonary infiltrates -ARDS On Hi flo oxygen and getting PO steroids- was on solumedrol Because of underlying immune-compromised state from prior use of Methotrexate for rheumatoid arthritis she is high risk for persistent or slow clearance of virus from the lungs will continue airborne for a total of 3 weeks from 04/06/19 when the second round of steroids were started.  Discussed with care team ?  Note:  This document was prepared using Dragon voice recognition software and may include unintentional dictation errors.

## 2019-04-23 NOTE — Evaluation (Signed)
Physical Therapy Evaluation Patient Details Name: [name] [name] MRN: [id number] DOB: [birth year] Today's Date: 04/23/2019   History of Present Illness  presented to ER secondary to SOB; admitted for management of acute respiratory disease and sepsis secondary to COVID-19 virus.  Clinical Impression  Upon evaluation, patient alert and oriented; follows commands and demonstrates good effort with session.  Bilat UE/LE strength and ROM grossly symmetrical and WFL; no focal weakness appreciated. Does endorse baseline neuropathy (mid-calf distally bilat), unchanged with this event.  Currently requiring min assist for bed mobility, sit/stand, basic transfers and gait (5') with RW.  Demonstrates broad BOS, mod reliance on RW, but no overt buckling or LOB with activity.  Noted desat to 85% on HFNC (50L), requiring 1-2 min seated rest and pursed lip breathing for recovery.  Additional gait limited as result; will continue to assess/progress as medically appropriate. Would benefit from skilled PT to address above deficits and promote optimal return to PLOF.; Recommend transition to HHPT upon discharge from acute hospitalization.     Follow Up Recommendations Home health PT    Equipment Recommendations       Recommendations for Other Services       Precautions / Restrictions Precautions Precautions: Fall Restrictions Weight Bearing Restrictions: No      Mobility  Bed Mobility Overal bed mobility: Needs Assistance Bed Mobility: Supine to Sit     Supine to sit: Min assist        Transfers Overall transfer level: Needs assistance Equipment used: Rolling walker (2 wheeled) Transfers: Sit to/from Stand Sit to Stand: Min assist         General transfer comment: heavy use of UEs for transitional movements and standing balance  Ambulation/Gait   Gait Distance (Feet): 5 Feet Assistive device: Rolling walker (2 wheeled)       General Gait Details: broad BOS, mild forward trunk  flexion; fair stability and overall strength.  Desat to 85% on HFNC, requiring 1-2 min seated rest for recovery  Stairs            Wheelchair Mobility    Modified Rankin (Stroke Patients Only)       Balance Overall balance assessment: Needs assistance Sitting-balance support: No upper extremity supported;Feet supported Sitting balance-Leahy Scale: Good     Standing balance support: Bilateral upper extremity supported Standing balance-Leahy Scale: Fair                               Pertinent Vitals/Pain Pain Assessment: No/denies pain    Home Living Family/patient expects to be discharged to:: Private residence Living Arrangements: Children Available Help at Discharge: Family Type of Home: House Home Access: Ramped entrance     Home Layout: One level Home Equipment: Environmental consultant - 2 wheels;Cane - single point;Shower seat - built in;Bedside commode;Wheelchair - power;Other (comment)      Prior Function Level of Independence: Independent         Comments: Indep without assist device for ADLs, household and community mobilization; no home O2.     Hand Dominance   Dominant Hand: Right    Extremity/Trunk Assessment   Upper Extremity Assessment Upper Extremity Assessment: Overall WFL for tasks assessed    Lower Extremity Assessment Lower Extremity Assessment: Overall WFL for tasks assessed(grossly 4-/5 throughout bilat LEs)       Communication   Communication: No difficulties  Cognition Arousal/Alertness: Awake/alert Behavior During Therapy: WFL for tasks assessed/performed Overall Cognitive Status: Within Functional  Limits for tasks assessed                                        General Comments      Exercises Other Exercises Other Exercises: Educated and reinforced use of incentive spirometer, postural extension/chest expansion activities; patient able to return demonstration.  Limited by persistent dry coughing Other  Exercises: Sit/stand x2 with RW, min assist for lift off, balance   Assessment/Plan    PT Assessment Patient needs continued PT services  PT Problem List Decreased strength;Decreased activity tolerance;Cardiopulmonary status limiting activity;Obesity       PT Treatment Interventions Gait training;Functional mobility training;Therapeutic activities;Therapeutic exercise;Balance training;Neuromuscular re-education;Patient/family education;DME instruction    PT Goals (Current goals can be found in the Care Plan section)  Acute Rehab PT Goals Patient Stated Goal: to get strength back PT Goal Formulation: With patient Time For Goal Achievement: 04/13/19 Potential to Achieve Goals: Good    Frequency Min 2X/week   Barriers to discharge Decreased caregiver support      Co-evaluation               AM-PAC PT "6 Clicks" Mobility  Outcome Measure Help needed turning from your back to your side while in a flat bed without using bedrails?: None Help needed moving from lying on your back to sitting on the side of a flat bed without using bedrails?: None Help needed moving to and from a bed to a chair (including a wheelchair)?: A Little Help needed standing up from a chair using your arms (e.g., wheelchair or bedside chair)?: A Little Help needed to walk in hospital room?: A Little Help needed climbing 3-5 steps with a railing? : A Little 6 Click Score: 20    End of Session Equipment Utilized During Treatment: Oxygen Activity Tolerance: Patient tolerated treatment well Patient left: in chair;with call bell/phone within reach Nurse Communication: Mobility status PT Visit Diagnosis: Other abnormalities of gait and mobility (R26.89);Unsteadiness on feet (R26.81)    Time: 1550-1616 PT Time Calculation (min) (ACUTE ONLY): 26 min   Charges:   PT Evaluation $PT Eval Moderate Complexity: 1 Mod PT Treatments $Therapeutic Activity: 8-22 mins        Shatiqua Heroux H. Manson Passey, PT, DPT,  NCS 04/23/19, 5:27 PM [phone]

## 2019-04-24 DIAGNOSIS — Z7189 Other specified counseling: Secondary | ICD-10-CM

## 2019-04-24 DIAGNOSIS — J984 Other disorders of lung: Secondary | ICD-10-CM

## 2019-04-24 DIAGNOSIS — Z515 Encounter for palliative care: Secondary | ICD-10-CM

## 2019-04-24 LAB — CBC
HCT: 35.7 % — ABNORMAL LOW (ref 36.0–46.0)
Hemoglobin: 11.3 g/dL — ABNORMAL LOW (ref 12.0–15.0)
MCH: 25.9 pg — ABNORMAL LOW (ref 26.0–34.0)
MCHC: 31.7 g/dL (ref 30.0–36.0)
MCV: 81.9 fL (ref 80.0–100.0)
Platelets: 219 10*3/uL (ref 150–400)
RBC: 4.36 MIL/uL (ref 3.87–5.11)
RDW: 16.7 % — ABNORMAL HIGH (ref 11.5–15.5)
WBC: 8.3 10*3/uL (ref 4.0–10.5)
nRBC: 0 % (ref 0.0–0.2)

## 2019-04-24 LAB — BASIC METABOLIC PANEL
Anion gap: 8 (ref 5–15)
BUN: 58 mg/dL — ABNORMAL HIGH (ref 6–20)
CO2: 27 mmol/L (ref 22–32)
Calcium: 8.8 mg/dL — ABNORMAL LOW (ref 8.9–10.3)
Chloride: 101 mmol/L (ref 98–111)
Creatinine, Ser: 0.85 mg/dL (ref 0.44–1.00)
GFR calc Af Amer: >60 mL/min (ref >60)
GFR calc non Af Amer: >60 mL/min (ref >60)
Glucose, Bld: 180 mg/dL — ABNORMAL HIGH (ref 70–99)
Potassium: 3.9 mmol/L (ref 3.5–5.1)
Sodium: 136 mmol/L (ref 135–145)

## 2019-04-24 LAB — ASPERGILLUS ANTIGEN, BAL/SERUM: Aspergillus Ag, BAL/Serum: 0.09 Index (ref 0.00–0.49)

## 2019-04-24 LAB — GLUCOSE, CAPILLARY
Glucose-Capillary: 116 mg/dL — ABNORMAL HIGH (ref 70–99)
Glucose-Capillary: 140 mg/dL — ABNORMAL HIGH (ref 70–99)
Glucose-Capillary: 295 mg/dL — ABNORMAL HIGH (ref 70–99)
Glucose-Capillary: 275 mg/dL — ABNORMAL HIGH (ref 70–99)

## 2019-04-24 MED ORDER — INSULIN ASPART 100 UNIT/ML ~~LOC~~ SOLN
16.0000 [IU] | Freq: Three times a day (TID) | SUBCUTANEOUS | Status: DC
  Administered 2019-04-24 – 2019-04-26 (×5): 16 [IU] via SUBCUTANEOUS
  Filled 2019-04-24 (×7): qty 1

## 2019-04-24 NOTE — Progress Notes (Signed)
MEDICATION RELATED CONSULT NOTE    Pharmacy Consult for Insulin regimen   Patient Measurements: Height: 5\' 9"  (175.3 cm) Weight: (!) 320 lb (145.2 kg) IBW/kg (Calculated) : 66.2   Vital Signs: Temp: 96 F (35.6 C) (01/05 1200) Temp Source: Axillary (01/05 1200) BP: 104/68 (01/05 1200) Pulse Rate: 83 (01/05 1100) Intake/Output from previous day: 01/04 0701 - 01/05 0700 In: 1450 [P.O.:1440; I.V.:10] Out: 610 [Urine:610] Intake/Output from this shift: Total I/O In: 700 [P.O.:700] Out: 50 [Urine:50]   Assessment: Lantus 65 units daily + lantus 15 units at bedtime Novolog 12 units TID w/ meals SSI 0-20 with meals SSI 0-5 units QHS linagliptin 5 mg PO daily  Methylpred 40 mg IV q12h since 12/22 - d/c'ed 1/2 & changed to dexamethasone 6 mg po daily.  Currently 16 days of steroids total.  BG range over the previous 24h:  116-368  Plan:   Increase meal time insulin to 16 units as recommended by diabetic coordinator.  BG still peaked at 368 yesterday following 12 units with meals.    Continue previous remaining insulin, linaglipitin regimen  Follow up in AM to assess trend.  1/23, PharmD Pharmacy Resident  04/24/2019 2:14 PM

## 2019-04-24 NOTE — Progress Notes (Signed)
Initial Nutrition Assessment  DOCUMENTATION CODES:   Morbid obesity  INTERVENTION:   Ensure Max protein supplement BID, each supplement provides 150kcal and 30g of protein.  Liberalize diet   NUTRITION DIAGNOSIS:   Increased nutrient needs related to catabolic illness(COVID 19) as evidenced by increased estimated needs.  GOAL:   Patient will meet greater than or equal to 90% of their needs  MONITOR:   PO intake, Supplement acceptance, Labs, Weight trends, Skin, I & O's  REASON FOR ASSESSMENT:   LOS    ASSESSMENT:   59 y.o. female former smoker with a history of diabetes mellitus, morbid obesity, CHF, depression, IBS and rheumatoid arthritis diagnosed with COVID-19 on 12/4 was hospitalized at [facility] and discharged, now admitted with PNA and ARDS   Pt with improved appetite and oral intake since admit. Pt initially eating 25% of meals but is now completing 100% of meals and drinking protein supplements. Per chart, pt appears weight stable pta. No new weight since admit; will request weekly weights.   Medications reviewed and include: vitamin C, oscal w/ D, D3, dexamethasone, lovenox, folic acid, insulin, synthroid, zinc  Labs reviewed: BUN 58(H) cbgs- 203, 321, 368, 116, 140 x 24 hrs AIC 10.7(H)- 12/8  Unable to complete Nutrition-Focused physical exam at this time as pt with COVID 19.   Diet Order:   Diet Order            Diet Carb Modified Fluid consistency: Thin; Room service appropriate? Yes  Diet effective now             EDUCATION NEEDS:   No education needs have been identified at this time  Skin:  Skin Assessment: Reviewed RN Assessment(ecchymosis, MASD, wound buttocks)  Last BM:  1/5- type 6  Height:   Ht Readings from Last 1 Encounters:  04/10/19 5\' 9"  (1.753 m)    Weight:   Wt Readings from Last 1 Encounters:  04/06/19 (!) 145.2 kg    Ideal Body Weight:  65.9 kg  BMI:  Body mass index is 47.26 kg/m.  Estimated Nutritional  Needs:   Kcal:  2700-3000kcal/day  Protein:  >135g/day  Fluid:  >2L/day  04/08/19 MS, RD, LDN Pager #- [phone] Office#- [phone] After Hours Pager: [phone]

## 2019-04-24 NOTE — Progress Notes (Signed)
Physical Therapy Treatment Patient Details Name: [name] [name] MRN: [id number] DOB: [birth year] Today's Date: 04/24/2019    History of Present Illness presented to ER secondary to SOB; admitted for management of acute respiratory disease and sepsis secondary to COVID-19 virus.    PT Comments    Improved alertness and overall participation with session this date; remains limited by moderate dry coughing and desaturation (80-84%) with minimal exertion on HFNC.  Good use of pursed lip breathing and intermittent rest periods with activity, but does require extended time for recovery with all activities.     Follow Up Recommendations  Home health PT     Equipment Recommendations  None recommended by PT    Recommendations for Other Services       Precautions / Restrictions Precautions Precautions: Fall Restrictions Weight Bearing Restrictions: No    Mobility  Bed Mobility Overal bed mobility: Needs Assistance Bed Mobility: Supine to Sit     Supine to sit: Supervision        Transfers Overall transfer level: Needs assistance Equipment used: Rolling walker (2 wheeled) Transfers: Sit to/from Stand Sit to Stand: Min assist            Ambulation/Gait   Gait Distance (Feet): 5 Feet Assistive device: Rolling walker (2 wheeled) Gait Pattern/deviations: Step-through pattern     General Gait Details: slow, guarded movement pattern; mod reliance on RW. Desat 80-85% with minimal exertion, requiring extended time for recovery.   Stairs             Wheelchair Mobility    Modified Rankin (Stroke Patients Only)       Balance Overall balance assessment: Needs assistance Sitting-balance support: Feet supported;No upper extremity supported Sitting balance-Leahy Scale: Good     Standing balance support: Bilateral upper extremity supported Standing balance-Leahy Scale: Fair                              Cognition Arousal/Alertness:  Awake/alert Behavior During Therapy: WFL for tasks assessed/performed Overall Cognitive Status: Within Functional Limits for tasks assessed                                        Exercises Other Exercises Other Exercises: Seated LE therex, 1x15, active ROM: ankle pumps, LAQs, marching; seated UE therex, 1x10, AROM: horiz abduct/adduct, trunk flex/extension with emphasis on pursed lip breathing and chest expansion.  Mod persistent, dry coughing with minimal activity; extended rest periods for recovery.    General Comments        Pertinent Vitals/Pain Pain Assessment: No/denies pain    Home Living                      Prior Function            PT Goals (current goals can now be found in the care plan section) Acute Rehab PT Goals Patient Stated Goal: to get strength back PT Goal Formulation: With patient Time For Goal Achievement: 04/13/19 Potential to Achieve Goals: Good Progress towards PT goals: Progressing toward goals    Frequency    Min 2X/week      PT Plan Current plan remains appropriate    Co-evaluation              AM-PAC PT "6 Clicks" Mobility   Outcome Measure  Help needed turning from  your back to your side while in a flat bed without using bedrails?: None Help needed moving from lying on your back to sitting on the side of a flat bed without using bedrails?: None Help needed moving to and from a bed to a chair (including a wheelchair)?: A Little Help needed standing up from a chair using your arms (e.g., wheelchair or bedside chair)?: A Little Help needed to walk in hospital room?: A Little Help needed climbing 3-5 steps with a railing? : A Little 6 Click Score: 20    End of Session Equipment Utilized During Treatment: Oxygen Activity Tolerance: Patient tolerated treatment well Patient left: in chair;with call bell/phone within reach Nurse Communication: Mobility status PT Visit Diagnosis: Other abnormalities of  gait and mobility (R26.89);Unsteadiness on feet (R26.81)     Time: 7215-8727 PT Time Calculation (min) (ACUTE ONLY): 33 min  Charges:  $Gait Training: 8-22 mins $Therapeutic Activity: 8-22 mins                     Amyr Sluder H. Manson Passey, PT, DPT, NCS 04/24/19, 2:55 PM [phone]

## 2019-04-24 NOTE — Consult Note (Addendum)
Consultation Note Date: 04/24/2019   Patient Name: [name] [name]  DOB: Nov 12, 1960  MRN: [id number]  Age / Sex: 59 y.o., female   PCP: [facility] [place] [facility] Referring Physician: Erin Fulling, MD   REASON FOR CONSULTATION:Establishing goals of care  Palliative Care consult requested for this 59 y.o. female with multiple medical problems including hypertension, hyperlipidemia, diabetes mellitus, GERD, hypothyroidism, depression, anxiety, OSA, rheumatoid arthritis, dCHF, C. 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 She was recently hospitalized 12/8-12/12 for COVID and received Remdesivir and steroids. She presented to ED with complaints of worsening shortness of breath and chest pain. She required a non-rebreather per EMS due to oxygen saturations in 70s. On work-up D-dimer 1405, Chest x-ray showed patchy infiltration. CT angiogram was negative for PE. She has been seen by ID and determined due to her previous treatments of methotrexate and other immuno-compromising therapies she is high risk for persistent or slow clearance of virus from the lungs requiring continued airborne precautions. Since admission she continues to require high flow oxygen support.   Clinical Assessment and Goals of Care: I have reviewed medical records including lab results, imaging, Epic notes, and MAR, received report from the bedside RN, and assessed the patient. I spoke with [name] [name] via telephone due to COVID-19 and preservation of PPE.  We discussed diagnosis prognosis, GOC, EOL wishes, disposition and options. Patient is awake, alert, and oriented x3. She denies pain except when she has continued coughing spells. She complains of shortness of breath with exertion.   I introduced Palliative Medicine as specialized medical care for people living with serious illness. It focuses on providing relief from the symptoms and stress of a serious illness. The goal  is to improve quality of life for both the patient and the family. [name] [name] verbalized understanding and appreciation.   We discussed a brief life review of the patient, along with her functional and nutritional status. Patient reports she resides in the home with her daughter. She has a son also who is of great support. She does not work due to being disabled from her rheumatoid arthritis. She shares her love and joy for her 6 grandchildren.   Prior to admission patient reports having a good quality of life and independent. She is able to perform all ADLs independently including driving. She denies home oxygen use.   We discussed Her current illness and what it means in the larger context of Her on-going co-morbidities. With specific discussions regarding respiratory failure, and overall functional state. Natural disease trajectory and expectations at EOL were discussed.  [name] [name] verbalized understanding of her current illness and mentions her struggles with her health specifically her breathing since contracting the COVID virus. She remains hopeful she will somewhat improve but is also aware this may take some time and may be challenging.   I attempted to elicit values and goals of care important to the patient.    [name] [name] again expresses she remains hopeful for improvement and wishes to continue with all aggressive care allowing her the opportunity to improve. She verbalizes she is open to all aggressive measures including rehab as recommended.   Advanced directives, concepts specific to code status, artifical feeding and hydration, and rehospitalization were considered and discussed. Patient does not have a documented advanced directive. She reports in the event she is unable to make decisions for herself both her son and daughter will be expected to jointly  make decisions.   I discussed her full code status with consideration to her current illness. She is requesting full code and full  scope care. Again acknowledging her good quality of life pre-COVID.   Questions and concerns were addressed. The family was encouraged to call with questions or concerns.  PMT will continue to support holistically.   SOCIAL HISTORY:     reports that she quit smoking about 19 years ago. Her smoking use included cigarettes. She has a 54.00 pack-year smoking history. She has never used smokeless tobacco. She reports that she does not drink alcohol or use drugs.  CODE STATUS: Full code  ADVANCE DIRECTIVES: Next of Kin    SYMPTOM MANAGEMENT: Per attending   Palliative Prophylaxis:   Aspiration, Frequent Pain Assessment and Oral Care  PSYCHO-SOCIAL/SPIRITUAL:  Support System: Family  Desire for further Chaplaincy support:NO   Additional Recommendations (Limitations, Scope, Preferences):  Full Scope Treatment   PAST MEDICAL HISTORY: Past Medical History:  Diagnosis Date  . Abdominal wall hernia 01/29/2013  . Anxiety   . Arthritis    Rheumatoid  . C. difficile colitis   . Chronic diastolic heart failure (HCC)   . Depression   . Diabetes mellitus    states no meds or diet restrictions  at present  . Diastolic CHF (HCC)   . Esophagitis   . Fluid retention   . GERD (gastroesophageal reflux disease)   . Hiatal hernia   . Hypertension   . Hypokalemia due to loss of potassium 10/21/2015   Overview:  Associated with 3 weeks of diarrhea  And QT prolongation.  . Hypothyroidism   . IBS (irritable bowel syndrome)   . Moderate episode of recurrent major depressive disorder (HCC) 06/03/2004  . Morbid obesity (HCC)   . MRSA (methicillin resistant Staphylococcus aureus) infection 11/2017   left inner thigh abcess  . Neurogenic bladder    has pacemaker  . Neuropathy   . Obesity   . Panic attacks   . Rheumatoid arthritis (HCC)   . Sleep apnea    STATES SEVERE, CANT TOLERATE MASK- LAST STUDY YEARS AGO    PAST SURGICAL HISTORY:  Past Surgical History:  Procedure Laterality Date    . ABDOMINAL HYSTERECTOMY    . CHOLECYSTECTOMY    . DG GREAT TOE RIGHT FOOT  02/23/2018  . EYE SURGERY     bilateral cataract extraction with IOL  . HERNIA REPAIR     ventral hernia with strangulation  . LAPAROSCOPIC GASTRIC BANDING  03/20/07  . TONSILLECTOMY    . TUBAL LIGATION      ALLERGIES:  is allergic to cephalexin; codeine; doxycycline; propoxyphene; sulfa antibiotics; lovenox [enoxaparin sodium]; hydrocodone; and meropenem.   MEDICATIONS:  Current Facility-Administered Medications  Medication Dose Route Frequency Provider Last Rate Last Admin  . acetaminophen (TYLENOL) tablet 650 mg  650 mg Oral Q6H PRN Lorretta Harp, MD   650 mg at 04/10/19 0224  . albuterol (VENTOLIN HFA) 108 (90 Base) MCG/ACT inhaler 2 puff  2 puff Inhalation Q4H PRN Lorretta Harp, MD      . ALPRAZolam Prudy Feeler) tablet 0.5 mg  0.5 mg Oral TID PRN Erin Fulling, MD   0.5 mg at 04/17/19 2127  . ascorbic acid (VITAMIN C) tablet 500 mg  500 mg Oral Daily Lorretta Harp, MD   500 mg at 04/24/19 3557  . atorvastatin (LIPITOR) tablet 80 mg  80 mg Oral q1800 Salena Saner, MD   80 mg at 04/23/19 2137  . busPIRone (BUSPAR) tablet  10 mg  10 mg Oral BID Ivor Costa, MD   10 mg at 04/24/19 0093  . calcium-vitamin D (OSCAL WITH D) 500-200 MG-UNIT per tablet 1 tablet  1 tablet Oral Daily Ivor Costa, MD   1 tablet at 04/24/19 [phone]  . Chlorhexidine Gluconate Cloth 2 % PADS 6 each  6 each Topical Daily Danford, Suann Larry, MD   6 each at 04/24/19 0930  . cholecalciferol (VITAMIN D3) tablet 1,000 Units  1,000 Units Oral Q breakfast Ivor Costa, MD   1,000 Units at 04/24/19 [phone]  . ciclesonide (ALVESCO) 160 MCG/ACT inhaler 4 puff  4 puff Inhalation BID Tyler Pita, MD   4 puff at 04/24/19 [phone]  . darifenacin (ENABLEX) 24 hr tablet 7.5 mg  7.5 mg Oral Daily Ivor Costa, MD   7.5 mg at 04/23/19 0955  . dexamethasone (DECADRON) tablet 6 mg  6 mg Oral Daily Tyler Pita, MD   6 mg at 04/24/19 7893  . enoxaparin (LOVENOX)  injection 75 mg  0.5 mg/kg Subcutaneous Q12H Ottie Glazier, MD   75 mg at 04/24/19 0918  . famotidine (PEPCID) tablet 40 mg  40 mg Oral Daily Tyler Pita, MD   40 mg at 04/24/19 8101  . FLUoxetine (PROZAC) capsule 20 mg  20 mg Oral Daily Ivor Costa, MD   20 mg at 75/10/25 8527  . folic acid (FOLVITE) tablet 1 mg  1 mg Oral Daily Ivor Costa, MD   1 mg at 04/24/19 7824  . guaiFENesin (ROBITUSSIN) 100 MG/5ML solution 100 mg  5 mL Oral Q4H PRN Frederik Pear, MD   100 mg at 04/24/19 0428  . insulin aspart (novoLOG) injection 0-20 Units  0-20 Units Subcutaneous TID WC Ottie Glazier, MD   3 Units at 04/24/19 1241  . insulin aspart (novoLOG) injection 0-5 Units  0-5 Units Subcutaneous QHS Awilda Bill, NP   5 Units at 04/23/19 2149  . insulin aspart (novoLOG) injection 12 Units  12 Units Subcutaneous TID WC Dallie Piles, RPH   12 Units at 04/24/19 1241  . insulin glargine (LANTUS) injection 15 Units  15 Units Subcutaneous QHS Ottie Glazier, MD   15 Units at 04/23/19 2138  . insulin glargine (LANTUS) injection 65 Units  65 Units Subcutaneous Daily Edwin Dada, MD   65 Units at 04/24/19 2353  . levothyroxine (SYNTHROID) tablet 88 mcg  88 mcg Oral QAC breakfast Ivor Costa, MD   88 mcg at 04/24/19 6144  . linagliptin (TRADJENTA) tablet 5 mg  5 mg Oral Daily Danford, Suann Larry, MD   5 mg at 04/24/19 3154  . lip balm (BLISTEX) ointment   Topical PRN Darel Hong D, NP      . magic mouthwash  15 mL Oral TID PRN Tyler Pita, MD   15 mL at 04/10/19 0534  . MEDLINE mouth rinse  15 mL Mouth Rinse BID Awilda Bill, NP   15 mL at 04/24/19 0086  . mupirocin ointment (BACTROBAN) 2 %   Nasal BID Tyler Pita, MD   Given at 04/24/19 [phone]  . ondansetron (ZOFRAN) injection 4 mg  4 mg Intravenous Q8H PRN Ivor Costa, MD   4 mg at 04/08/19 1104  . oxyCODONE (Oxy IR/ROXICODONE) immediate release tablet 5 mg  5 mg Oral Q6H PRN Ivor Costa, MD   5 mg at 04/24/19 0438  .  polyethylene glycol (MIRALAX / GLYCOLAX) packet 17 g  17 g Oral Daily PRN Darel Hong  D, NP      . pramipexole (MIRAPEX) tablet 0.125 mg  0.125 mg Oral BID Lorretta Harp, MD   0.125 mg at 04/24/19 5883  . pregabalin (LYRICA) capsule 150 mg  150 mg Oral TID Lorretta Harp, MD   150 mg at 04/24/19 2549  . protein supplement (ENSURE MAX) liquid  11 oz Oral BID BM Aleskerov, Fuad, MD   11 oz at 04/24/19 1240  . sodium chloride flush (NS) 0.9 % injection 10-40 mL  10-40 mL Intracatheter Q12H Erin Fulling, MD   10 mL at 04/24/19 0929  . zinc sulfate capsule 220 mg  220 mg Oral Daily Lorretta Harp, MD   220 mg at 04/24/19 8264  . zolpidem (AMBIEN) tablet 5 mg  5 mg Oral QHS Lorretta Harp, MD   5 mg at 04/23/19 2138    VITAL SIGNS: BP 104/68 (BP Location: Right Arm)   Pulse 83   Temp (!) 96 F (35.6 C) (Axillary)   Resp 19   Ht 5\' 9"  (1.753 m)   Wt (!) 145.2 kg   LMP 04/20/2001   SpO2 90%   BMI 47.26 kg/m  Filed Weights   04/06/19 1000  Weight: (!) 145.2 kg    Estimated body mass index is 47.26 kg/m as calculated from the following:   Height as of this encounter: 5\' 9"  (1.753 m).   Weight as of this encounter: 145.2 kg.  LABS: CBC:    Component Value Date/Time   WBC 8.3 04/24/2019 0502   HGB 11.3 (L) 04/24/2019 0502   HGB 12.1 05/27/2014 1439   HCT 35.7 (L) 04/24/2019 0502   HCT 37.5 05/27/2014 1439   PLT 219 04/24/2019 0502   PLT 221 05/27/2014 1439   Comprehensive Metabolic Panel:    Component Value Date/Time   NA 136 04/24/2019 0502   NA 134 11/16/2018 1539   NA 136 05/27/2014 1439   K 3.9 04/24/2019 0502   K 3.8 05/27/2014 1439   CO2 27 04/24/2019 0502   CO2 28 05/27/2014 1439   BUN 58 (H) 04/24/2019 0502   BUN 13 11/16/2018 1539   BUN 6 (L) 05/27/2014 1439   CREATININE 0.85 04/24/2019 0502   CREATININE 0.83 05/27/2014 1439   ALBUMIN 2.6 (L) 04/23/2019 0526   ALBUMIN 4.2 11/16/2018 1539   ALBUMIN 2.7 (L) 12/03/2013 0511     Review of Systems  Constitutional:  Positive for fatigue.  Respiratory: Positive for shortness of breath.   Neurological: Positive for weakness.  All other systems reviewed and are negative. Unless otherwise noted, a complete review of systems is negative.  Prognosis: Unable to determine-Guarded   Discharge Planning:  To Be Determined  Recommendations:  Full Code/Full Scope-as requested by patient  Continue with current plan of care per medical team  Patient remains hopeful for improvement. Expresses previous health and QOL was decent prior to COVID.   PMT will continue to support and follow.    Palliative Performance Scale:               Patient expressed understanding and was in agreement with this plan.   The above conversation was completed via telephone during the COVID-19 pandemic and need to preserve PPE. Thorough chart review and discussion with necessary members of the care team was completed as part of assessment. All issues were discussed and addressed but no physical exam was performed.  Thank you for allowing the Palliative Medicine Team to assist in the care of this patient.  Time  In: 1215 Time Out: 1320 Time Total: 65 min.   Visit consisted of counseling and education dealing with the complex and emotionally intense issues of symptom management and palliative care in the setting of serious and potentially life-threatening illness.Greater than 50%  of this time was spent counseling and coordinating care related to the above assessment and plan.  Signed by:  Willette Alma, AGPCNP-BC Palliative Medicine Team  Phone: [phone] Fax: [phone] Pager: [phone] Amion: Thea Alken

## 2019-04-24 NOTE — Progress Notes (Signed)
Inpatient Diabetes Program Recommendations  AACE/ADA: New Consensus Statement on Inpatient Glycemic Control (2015)  Target Ranges:  Prepandial:   less than 140 mg/dL      Peak postprandial:   less than 180 mg/dL (1-2 hours)      Critically ill patients:  140 - 180 mg/dL   Lab Results  Component Value Date   GLUCAP 116 (H) 04/24/2019   HGBA1C 10.7 (H) 03/27/2019    Review of Glycemic Control  Results for [name] [name], FAKE (MRN [id number]) as of 04/24/2019 10:08  Ref. Range 04/23/2019 08:31 04/23/2019 12:28 04/23/2019 16:31 04/23/2019 21:35 04/24/2019 05:02 04/24/2019 09:05  Glucose-Capillary Latest Ref Range: 70 - 99 mg/dL 288 (H)  NOVOLOG 13 units 203 (H)  NOVOLOG 17units 321 (H)  NOVOLOG 27units 368 (H)  NOVOLOG 15units  116 (H)  NOVOLOG 12 units    Diabetes history: DM2 Outpatient Diabetes medications: Tresiba 65-100 units daily; Metformin 500 mg twice daily Current orders for Inpatient glycemic control: Lantus 65 units QAM; Lantus 15 QPM; Novolog 0-20 TID with meals; 0-5 HS; Trajenta 5 mg QD; Decadron 6 units daily  Inpatient Diabetes Program Recommendations:     -Novolog 16 units TID with meals  Thank you, Zerita Boers, RN, BSN Diabetes Coordinator Inpatient Diabetes Program [phone] (team pager from 8a-5p)

## 2019-04-24 NOTE — Clinical Social Work Note (Signed)
Patient unable to go to [facility] due to only having Medicaid.  Charlynn Court, CSW [phone]

## 2019-04-24 NOTE — Progress Notes (Signed)
CRITICAL CARE PROGRESS NOTE    Name: [name] MRN: [id number] DOB: 06/07/1960     LOS: 33   SUBJECTIVE FINDINGS & SIGNIFICANT EVENTS   Patient description:  [name] [name] is an 59 y.o. female former smoker with a history of diabetes mellitus, morbid obesity and rheumatoid arthritis diagnosed with COVID-19 on December 4.  Admitted to [facility] from 8 December through 12 December received 5 days of remdesivir.  Also received dexamethasone.  She has now noted to have ARDS due to delayed pulmonary injury from COVID-72   Lines / Drains: PIVx2  Cultures / Sepsis markers: Blood culture X1 positive for staph epi with resistance - possibly contaminant  Antibiotics: None    Protocols / Consultants: Hospitalist/pccm   Tests / Events: 12/28-Weaned down to  75%FiO2, mentation improving.  12/29- clinically imporved, diet advanced, heparin decreased per PTT 12/30 -HFNC75%, self proning 12/31 - continue to wean O2,monitor vitals, patient hypotenisve this am  1/1 - patient smiling this am , eating breakfast, no distress, plan to continue weaning fio2 on high flow 1/2 - no overnight events  1/3 severe hypoxia persists 1/4: participating with PT, O2,flow requirements decreasing  CC Follow up resp failure with hypoxia  Subjective Interactive, participating with PT, no complaints, no dyspnea improving    MEDICATIONS    vitamin C  500 mg Oral Daily   atorvastatin  80 mg Oral q1800   busPIRone  10 mg Oral BID   calcium-vitamin D  1 tablet Oral Daily   Chlorhexidine Gluconate Cloth  6 each Topical Daily   cholecalciferol  1,000 Units Oral Q breakfast   ciclesonide  4 puff Inhalation BID   darifenacin  7.5 mg Oral Daily   dexamethasone  6 mg Oral Daily   enoxaparin (LOVENOX) injection  0.5  mg/kg Subcutaneous Q12H   famotidine  40 mg Oral Daily   FLUoxetine  20 mg Oral Daily   folic acid  1 mg Oral Daily   insulin aspart  0-20 Units Subcutaneous TID WC   insulin aspart  0-5 Units Subcutaneous QHS   insulin aspart  12 Units Subcutaneous TID WC   insulin glargine  15 Units Subcutaneous QHS   insulin glargine  65 Units Subcutaneous Daily   levothyroxine  88 mcg Oral QAC breakfast   linagliptin  5 mg Oral Daily   mouth rinse  15 mL Mouth Rinse BID   mupirocin ointment   Nasal BID   pramipexole  0.125 mg Oral BID   pregabalin  150 mg Oral TID   Ensure Max Protein  11 oz Oral BID BM   sodium chloride flush  10-40 mL Intracatheter Q12H   zinc sulfate  220 mg Oral Daily   zolpidem  5 mg Oral QHS   PRN Medications: acetaminophen, albuterol, ALPRAZolam, guaiFENesin, lip balm, magic mouthwash, ondansetron (ZOFRAN) IV, oxyCODONE, polyethylene glycol   ALLERGIES   Cephalexin, Codeine, Doxycycline, Propoxyphene, Sulfa antibiotics, Lovenox [enoxaparin sodium], Hydrocodone, and Meropenem   COVID-19 DISASTER DECLARATION:   FULL CONTACT PHYSICAL EXAMINATION WAS NOT POSSIBLE DUE TO TREATMENT OF COVID-19 AND   CONSERVATION OF PERSONAL PROTECTIVE EQUIPMENT, LIMITED EXAM FINDINGS INCLUDE-   Patient assessed or the symptoms described in the history of present illness.   In the context of the Global COVID-19 pandemic, which necessitated consideration that the patient might be at risk for infection with the SARS-CoV-2 virus that causes COVID-19, Institutional protocols and algorithms that pertain to the evaluation of patients at risk for  COVID-19 are in a state of rapid change based on information released by regulatory bodies including the CDC and federal and state organizations. These policies and algorithms were followed during the patient's care while in hospital.     PHYSICAL EXAMINATION   Vital Signs: Temp:  [96 F (35.6 C)-98.1 F (36.7 C)] 97.9 F (36.6 C) (01/05  2000) Pulse Rate:  [68-149] 77 (01/05 2000) Resp:  [14-24] 20 (01/05 2000) BP: (84-143)/(57-94) 96/57 (01/05 2000) SpO2:  [80 %-100 %] 100 % (01/05 2000) FiO2 (%):  [60 %] 60 % (01/05 1333)   Hemodynamic parameters:   Intake/Output: 01/04 0701 - 01/05 0700 In: 1450 [P.O.:1440; I.V.:10] Out: 610 [Urine:610]  Ventilator  Settings: FiO2 (%):  [60 %] 60 %    LAB RESULTS:  Basic Metabolic Panel: Recent Labs  Lab 04/20/19 0518 04/21/19 0501 04/22/19 0435 04/23/19 0526 04/24/19 0502  NA 131* 130* 136 133* 136  K 4.2 4.1 4.0 4.2 3.9  CL 96* 97* 100 97* 101  CO2 23 21* 26 29 27   GLUCOSE 273* 361* 122* 181* 180*  BUN 67* 78* 66* 62* 58*  CREATININE 1.27* 1.33* 0.95 0.90 0.85  CALCIUM 9.1 9.0 9.2 9.1 8.8*   Liver Function Tests: Recent Labs  Lab 04/23/19 0526  AST 11*  ALT 11  ALKPHOS 53  BILITOT 0.8  PROT 7.3  ALBUMIN 2.6*   No results for input(s): LIPASE, AMYLASE in the last 168 hours. No results for input(s): AMMONIA in the last 168 hours. CBC: Recent Labs  Lab 04/20/19 0518 04/21/19 0501 04/22/19 0435 04/23/19 0526 04/24/19 0502  WBC 16.9* 20.1* 16.6* 10.1 8.3  NEUTROABS  --   --  13.5* 8.6*  --   HGB 12.2 11.9* 12.1 11.5* 11.3*  HCT 38.8 38.3 36.2 35.4* 35.7*  MCV 82.4 81.3 78.0* 79.2* 81.9  PLT 271 267 246 213 219   Cardiac Enzymes: No results for input(s): CKTOTAL, CKMB, CKMBINDEX, TROPONINI in the last 168 hours. BNP: Invalid input(s): POCBNP CBG: Recent Labs  Lab 04/23/19 1631 04/23/19 2135 04/24/19 0905 04/24/19 1137 04/24/19 1622  GLUCAP 321* 368* 116* 140* 295*     IMAGING RESULTS:  Imaging: No results found.    ASSESSMENT AND PLAN   Acute hypoxic respiratory failure Late pulmonary phase of COVID-19 disease Bilateral pulmonary infiltrates Low suspicion for bacterial infection as procalcitonin and clinical picture not suggestive  High flow O2 to maintain oxygen saturations between 88 to 92% Continue anti-inflammatory  therapy Switch to dexamethasone single dose due to hyperglycemia she is allergic to doxycycline which would be recommended to use however will need to withhold Supplement magnesium if needed Supplement Zinc and Thiamine, continue vitamin C Close monitoring in stepdown/ICU High risk for intubation   Hypercoagulable state No evidence of large PE Cannot exclude microthrombi Heparin infusion which is recommended in the late pulmonary phase Anti-inflammatory therapy as above   Diabetes mellitus type 2 Improved control Subcu insulin Close monitoring   Acute kidney injury Overall improving Avoid nephrotoxins Lasix as needed Follow renal panel Monitor I's and O's   Rheumatoid arthritis Recent Actemra dosing This issue adds complexity to her management Actemra discontinued 12/18 by her rheumatologist Received last Actemra dose in week PTA   Morbid obesity BMI 52 This issue adds complexity to her management   Will need isolation for 21 days post admission which will be 8 January

## 2019-04-24 NOTE — Evaluation (Signed)
Occupational Therapy Evaluation Patient Details Name: [name] [name] MRN: [id number] DOB: 23-Mar-1961 Today's Date: 04/24/2019    History of Present Illness presented to ER secondary to SOB; admitted for management of acute respiratory disease and sepsis secondary to COVID-19 virus.   Clinical Impression   [name] [name] was seen for OT evaluation this date. Prior to hospital admission, pt was active and independent. She reports that prior to the COVID-19 pandemic she enjoyed going to church regularly.  Pt lives with her adult duathter in a 1 level home with a ramped entrance.  Currently pt demonstrates impairments as listed below (See "OT problem list") which functionally impact her ability to perform self-care tasks at her baseline level of independence. Pt significantly limited by cardiopulmonary status during evaluation. Requires minimal to moderate assist for ADL tasks such as functional transfers, lower body dressing, and bathing.  Pt would benefit from skilled OT to address noted impairments and functional limitations (see below for any additional details) in order to maximize safety and independence while minimizing falls risk and caregiver burden.  Upon hospital discharge, recommend STR to maximize pt safety and return to PLOF.      Follow Up Recommendations  SNF (will continue to monitor as HR/pulmonary function improve)    Equipment Recommendations  None recommended by OT(Pt has necessary equipment)    Recommendations for Other Services       Precautions / Restrictions Precautions Precautions: Fall Precaution Comments: Watch HR/O2 with activity Restrictions Weight Bearing Restrictions: No      Mobility Bed Mobility Overal bed mobility: Needs Assistance Bed Mobility: Supine to Sit     Supine to sit: Supervision     General bed mobility comments: Deferred. Pt up in recliner at start/end of session.  Transfers Overall transfer level: Needs assistance Equipment used:  Rolling walker (2 wheeled) Transfers: Sit to/from Stand Sit to Stand: Min assist         General transfer comment: Deferred for pt safety. Pt pulse noted to vary greatly during session at the highest reaching 220 with pt at rest per room monitor;  Mobility attempts deferred and RN Notified immediately    Balance Overall balance assessment: Needs assistance Sitting-balance support: Feet supported;No upper extremity supported Sitting balance-Leahy Scale: Good Sitting balance - Comments: Pt steady static sitting, reaching within BOS.   Standing balance support: Bilateral upper extremity supported Standing balance-Leahy Scale: Fair                             ADL either performed or assessed with clinical judgement   ADL Overall ADL's : Needs assistance/impaired                                       General ADL Comments: Pt significantly limited by decreased activity tolerance and cardiopulmonary status during OT evaluation. Functional assessment limited to minimize pt exertion with activity. Pt requires min to moderate assist for heavy ADL tasks including bathing and dressing in a seated position. Pt would benefit from further energy conservation education and opporunity to trial technqiues during functional activity.     Vision Baseline Vision/History: Wears glasses Wears Glasses: Reading only Patient Visual Report: Blurring of vision       Perception     Praxis      Pertinent Vitals/Pain Pain Assessment: No/denies pain     Hand Dominance Right  Extremity/Trunk Assessment Upper Extremity Assessment Upper Extremity Assessment: Generalized weakness(BUE grossly 3/5 t/o. MMT limited by pt cardiopulmonary status during evaluation. Will continue to assess.)   Lower Extremity Assessment Lower Extremity Assessment: Defer to PT evaluation       Communication Communication Communication: No difficulties   Cognition Arousal/Alertness:  Awake/alert Behavior During Therapy: WFL for tasks assessed/performed Overall Cognitive Status: Within Functional Limits for tasks assessed                                     General Comments  Pt pulse and spO2 noted to vary greatly during session.  Upon arrival to pt room pt HR noted in 90's with spO2 of 92% with pt on 50 L HFNC. Pt pulse ranged from upper 90's to 220 as the highest as observed by this author. Pt spO2 noted to fluctuate between low of 80% to >90% during evaluation. OT assists pt with repositioning self in chair with legs up/supported. Activity limited for pt safety. Pt educated on pursed lip breathing and energy conservation strategies to maximize safety. RN notified immediately.    Exercises Other Exercises Other Exercises: Seated LE therex, 1x15, active ROM: ankle pumps, LAQs, marching; seated UE therex, 1x10, AROM: horiz abduct/adduct, trunk flex/extension with emphasis on pursed lip breathing and chest expansion.  Mod persistent, dry coughing with minimal activity; extended rest periods for recovery. Other Exercises: Pt educated on energy conservation strategies with emphasis on pursed lip breathing and activity pacing this date. Pt return demonstrates with min cueing for technique.   Shoulder Instructions      Home Living Family/patient expects to be discharged to:: Private residence Living Arrangements: Children Available Help at Discharge: Family;Available 24 hours/day Type of Home: House Home Access: Ramped entrance     Home Layout: One level     Bathroom Shower/Tub: Walk-in shower;Door   [employer] Toilet: Handicapped height Bathroom Accessibility: Yes How Accessible: Accessible via walker Home Equipment: Walker - 2 wheels;Cane - single point;Shower seat - built in;Bedside commode;Wheelchair - power;Other (comment)          Prior Functioning/Environment Level of Independence: Independent        Comments: Indep without assist device for  ADLs/IADLs, household and community mobilization; no home O2.        OT Problem List: Cardiopulmonary status limiting activity;Decreased strength;Decreased activity tolerance;Decreased safety awareness;Decreased knowledge of use of DME or AE;Impaired balance (sitting and/or standing)      OT Treatment/Interventions: Self-care/ADL training;Therapeutic exercise;Therapeutic activities;DME and/or AE instruction;Patient/family education;Balance training;Energy conservation    OT Goals(Current goals can be found in the care plan section) Acute Rehab OT Goals Patient Stated Goal: to get strength back OT Goal Formulation: With patient Time For Goal Achievement: 05/08/19 Potential to Achieve Goals: Good ADL Goals Pt Will Perform Grooming: sitting;with supervision;with set-up Pt Will Perform Upper Body Dressing: sitting;with set-up;with supervision Pt Will Perform Lower Body Dressing: with min assist;sitting/lateral leans;sit to/from stand;with adaptive equipment Additional ADL Goal #1: Pt will independently verbalize a plan to implement at least 3 learned energy conservation strategies into her daily routines/home environment for improved safety and functional independence upon DC.  OT Frequency: Min 1X/week   Barriers to D/C:            Co-evaluation              AM-PAC OT "6 Clicks" Daily Activity     Outcome Measure Help from another person  eating meals?: None Help from another person taking care of personal grooming?: A Little Help from another person toileting, which includes using toliet, bedpan, or urinal?: A Lot Help from another person bathing (including washing, rinsing, drying)?: A Lot Help from another person to put on and taking off regular upper body clothing?: A Little Help from another person to put on and taking off regular lower body clothing?: A Lot 6 Click Score: 16   End of Session Nurse Communication: Other (comment)(Pt HR/spO2 during session.)  Activity  Tolerance: Treatment limited secondary to medical complications (Comment) Patient left: in chair;with call bell/phone within reach  OT Visit Diagnosis: Other abnormalities of gait and mobility (R26.89);Muscle weakness (generalized) (M62.81)                Time: 8466-5993 OT Time Calculation (min): 26 min Charges:  OT General Charges $OT Visit: 1 Visit OT Evaluation $OT Eval Moderate Complexity: 1 Mod OT Treatments $Self Care/Home Management : 8-22 mins  Rockney Ghee, M.S., OTR/L Ascom: [phone] 04/24/19, 4:18 PM

## 2019-04-25 LAB — FUNGITELL, SERUM: Fungitell Result: 57 pg/mL (ref <80)

## 2019-04-25 LAB — CBC
HCT: 34.6 % — ABNORMAL LOW (ref 36.0–46.0)
Hemoglobin: 10.9 g/dL — ABNORMAL LOW (ref 12.0–15.0)
MCH: 26.1 pg (ref 26.0–34.0)
MCHC: 31.5 g/dL (ref 30.0–36.0)
MCV: 82.8 fL (ref 80.0–100.0)
Platelets: 206 10*3/uL (ref 150–400)
RBC: 4.18 MIL/uL (ref 3.87–5.11)
RDW: 16.7 % — ABNORMAL HIGH (ref 11.5–15.5)
WBC: 6.6 10*3/uL (ref 4.0–10.5)
nRBC: 0 % (ref 0.0–0.2)

## 2019-04-25 LAB — GLUCOSE, CAPILLARY
Glucose-Capillary: 91 mg/dL (ref 70–99)
Glucose-Capillary: 81 mg/dL (ref 70–99)
Glucose-Capillary: 171 mg/dL — ABNORMAL HIGH (ref 70–99)
Glucose-Capillary: 196 mg/dL — ABNORMAL HIGH (ref 70–99)

## 2019-04-25 LAB — BASIC METABOLIC PANEL
Anion gap: 8 (ref 5–15)
BUN: 49 mg/dL — ABNORMAL HIGH (ref 6–20)
CO2: 25 mmol/L (ref 22–32)
Calcium: 8.8 mg/dL — ABNORMAL LOW (ref 8.9–10.3)
Chloride: 103 mmol/L (ref 98–111)
Creatinine, Ser: 0.91 mg/dL (ref 0.44–1.00)
GFR calc Af Amer: >60 mL/min (ref >60)
GFR calc non Af Amer: >60 mL/min (ref >60)
Glucose, Bld: 115 mg/dL — ABNORMAL HIGH (ref 70–99)
Potassium: 4 mmol/L (ref 3.5–5.1)
Sodium: 136 mmol/L (ref 135–145)

## 2019-04-25 MED ORDER — DEXAMETHASONE 0.5 MG PO TABS
3.0000 mg | ORAL_TABLET | Freq: Every day | ORAL | Status: DC
  Administered 2019-04-26 – 2019-05-04 (×9): 3 mg via ORAL
  Filled 2019-04-25 (×9): qty 6

## 2019-04-25 NOTE — Progress Notes (Addendum)
MEDICATION RELATED CONSULT NOTE    Pharmacy Consult for Insulin regimen   Patient Measurements: Height: 5\' 9"  (175.3 cm) Weight: (!) 320 lb (145.2 kg) IBW/kg (Calculated) : 66.2   Vital Signs: Temp: 97.5 F (36.4 C) (01/06 0800) Temp Source: Axillary (01/06 0800) BP: 112/78 (01/06 1000) Pulse Rate: 61 (01/06 1000) Intake/Output from previous day: 01/05 0701 - 01/06 0700 In: 700 [P.O.:700] Out: 550 [Urine:550] Intake/Output from this shift: Total I/O In: 350 [P.O.:350] Out: 400 [Urine:400]   Assessment: Lantus 65 units daily + lantus 15 units at bedtime Novolog 16 units TID w/ meals SSI 0-20 with meals SSI 0-5 units QHS linagliptin 5 mg PO daily  Methylpred 40 mg IV q12h since 12/22 - d/c'ed 1/2 & changed to dexamethasone 6 mg po daily.  Decadron further weaned to 3 mg po daily starting 1/7.  Currently 17 days of steroids total.  BG range over the previous 24h:  81-295  Plan:   Per secure chat with Diabetes Coordinator, we will continue current regimen for today with no change.  BG tends to increase before supper and bedtime, and we may recommend increasing Lantus in the AM depending on glycemic trends today in the setting of tapering steroids.  Continue current insulin, linaglipitin regimen  Follow up in AM to assess trend.  3/7, PharmD Pharmacy Resident  04/25/2019 11:52 AM

## 2019-04-25 NOTE — Progress Notes (Signed)
CRITICAL CARE PROGRESS NOTE    Name: [name] [name] MRN: [id number] DOB: 07/17/1960     LOS: 19   SUBJECTIVE FINDINGS & SIGNIFICANT EVENTS   Patient description:  [name] [name] an 59 y.o.femaleformer smoker with a history of diabetes mellitus, morbid obesity and rheumatoid arthritis diagnosed with COVID-19 on December 4. Admitted to [facility] from 8 December through 12 December received 5 days of remdesivir. Also received dexamethasone.  She was admitted with ARDS and acute on subacute hypoxic respiratory failure   Lines / Drains: PIVx2  Cultures / Sepsis markers: Blood culture X1 positive for staph epi with resistance - possibly contaminant No other positive blood cultures  Antibiotics: None    Protocols / Consultants: Hospitalist/pccm   Tests / Events: 12/28-Weaned down to  75%FiO2, mentation improving.  12/29- clinically imporved, diet advanced, heparin decreased per PTT 12/30 -HFNC75%, self proning 12/31 - continue to wean O2,monitor vitals, patient hypotenisve this am  1/1 - patient smiling this am , eating breakfast, no distress, plan to continue weaning fio2 on high flow 1/2 - no overnight events  1/3 severe hypoxia persists 1/4: participating with PT, O2,flow requirements decreasing 1/6: Tolerating bubble high flow  CC Follow up resp failure with hypoxia  Subjective Interactive, participating with PT, no complaints, dyspnea continues to improve   MEDICATIONS   . vitamin C  500 mg Oral Daily  . atorvastatin  80 mg Oral q1800  . busPIRone  10 mg Oral BID  . calcium-vitamin D  1 tablet Oral Daily  . Chlorhexidine Gluconate Cloth  6 each Topical Daily  . cholecalciferol  1,000 Units Oral Q breakfast  . ciclesonide  4 puff Inhalation BID  . darifenacin  7.5 mg Oral  Daily  . dexamethasone  6 mg Oral Daily  . enoxaparin (LOVENOX) injection  0.5 mg/kg Subcutaneous Q12H  . famotidine  40 mg Oral Daily  . FLUoxetine  20 mg Oral Daily  . folic acid  1 mg Oral Daily  . insulin aspart  0-20 Units Subcutaneous TID WC  . insulin aspart  0-5 Units Subcutaneous QHS  . insulin aspart  12 Units Subcutaneous TID WC  . insulin glargine  15 Units Subcutaneous QHS  . insulin glargine  65 Units Subcutaneous Daily  . levothyroxine  88 mcg Oral QAC breakfast  . linagliptin  5 mg Oral Daily  . mouth rinse  15 mL Mouth Rinse BID  . mupirocin ointment   Nasal BID  . pramipexole  0.125 mg Oral BID  . pregabalin  150 mg Oral TID  . Ensure Max Protein  11 oz Oral BID BM  . sodium chloride flush  10-40 mL Intracatheter Q12H  . zinc sulfate  220 mg Oral Daily  . zolpidem  5 mg Oral QHS   PRN Medications: acetaminophen, albuterol, ALPRAZolam, guaiFENesin, lip balm, magic mouthwash, ondansetron (ZOFRAN) IV, oxyCODONE, polyethylene glycol   ALLERGIES   Cephalexin, Codeine, Doxycycline, Propoxyphene, Sulfa antibiotics, Lovenox [enoxaparin sodium], Hydrocodone, and Meropenem   COVID-19 DISASTER DECLARATION:   FULL CONTACT PHYSICAL EXAMINATION WAS NOT POSSIBLE DUE TO TREATMENT OF COVID-19 AND   CONSERVATION OF PERSONAL PROTECTIVE EQUIPMENT, LIMITED EXAM FINDINGS INCLUDE- Tolerating bubble high flow with good saturations No edema Sitting up in chair   Patient assessed or the symptoms described in the history of present illness.   In the context of the Global COVID-19 pandemic, which necessitated consideration that the patient might be at risk for infection with the SARS-CoV-2 virus that  causes COVID-19, Institutional protocols and algorithms that pertain to the evaluation of patients at risk for COVID-19 are in a state of rapid change based on information released by regulatory bodies including the CDC and federal and state organizations. These policies and algorithms  were followed during the patient's care while in hospital.     PHYSICAL EXAMINATION   Vital Signs: Temp:  [97.5 F (36.4 C)-99.5 F (37.5 C)] 98.6 F (37 C) (01/06 2000) Pulse Rate:  [53-78] 71 (01/06 2000) Resp:  [13-24] 24 (01/06 2000) BP: (90-144)/(61-83) 99/64 (01/06 2000) SpO2:  [84 %-98 %] 97 % (01/06 2000) FiO2 (%):  [60 %-80 %] 80 % (01/06 1355)  Hemodynamic parameters:   Intake/Output: 01/05 0701 - 01/06 0700 In: 700 [P.O.:700] Out: 550 [Urine:550]  Ventilator  Settings: FiO2 (%):  [60 %-80 %] 80 %    LAB RESULTS:  Basic Metabolic Panel: Recent Labs  Lab 04/21/19 0501 04/22/19 0435 04/23/19 0526 04/24/19 0502 04/25/19 0626  NA 130* 136 133* 136 136  K 4.1 4.0 4.2 3.9 4.0  CL 97* 100 97* 101 103  CO2 21* 26 29 27 25   GLUCOSE 361* 122* 181* 180* 115*  BUN 78* 66* 62* 58* 49*  CREATININE 1.33* 0.95 0.90 0.85 0.91  CALCIUM 9.0 9.2 9.1 8.8* 8.8*   Liver Function Tests: Recent Labs  Lab 04/23/19 0526  AST 11*  ALT 11  ALKPHOS 53  BILITOT 0.8  PROT 7.3  ALBUMIN 2.6*   No results for input(s): LIPASE, AMYLASE in the last 168 hours. No results for input(s): AMMONIA in the last 168 hours. CBC: Recent Labs  Lab 04/21/19 0501 04/22/19 0435 04/23/19 0526 04/24/19 0502 04/25/19 0626  WBC 20.1* 16.6* 10.1 8.3 6.6  NEUTROABS  --  13.5* 8.6*  --   --   HGB 11.9* 12.1 11.5* 11.3* 10.9*  HCT 38.3 36.2 35.4* 35.7* 34.6*  MCV 81.3 78.0* 79.2* 81.9 82.8  PLT 267 246 213 219 206   Cardiac Enzymes: No results for input(s): CKTOTAL, CKMB, CKMBINDEX, TROPONINI in the last 168 hours. BNP: Invalid input(s): POCBNP CBG: Recent Labs  Lab 04/24/19 2121 04/25/19 0757 04/25/19 1306 04/25/19 1655 04/25/19 2159  GLUCAP 275* 91 81 171* 196*     IMAGING RESULTS:  Imaging: No results found.    ASSESSMENT AND PLAN   Acute hypoxic respiratory failure Late pulmonary phase of COVID-19 disease Bilateral pulmonary infiltrates Low suspicion for  bacterial infection as procalcitonin and clinical picture not suggestive  High flow O2 to maintain oxygen saturations between 88 to 92% Continue anti-inflammatory therapy Supplement magnesium if needed Continue multivitamins Close monitoring in stepdown/ICU Tolerating bubble high flow  Hypercoagulable state No evidence of large PE On subcu Lovenox, higher dose Anti-inflammatory therapy as above  Diabetes mellitus type 2 Better control Subcu insulin Close monitoring  Acute kidney injury Improving/resolved Continue close monitoring of renal panel  Rheumatoid arthritis Recent Actemra dosing This issue adds complexity to her management Actemra discontinued 12/18 by her rheumatologist Received last Actemra dose in week PTA  Morbid obesity BMI 52 This issue adds complexity to her management  Will need isolation for 21 days post admission which will be 8 January  C. Derrill Kay, MD [facility]   *This note was dictated using voice recognition software/Dragon.  Despite best efforts to proofread, errors can occur which can change the meaning.  Any change was purely unintentional.

## 2019-04-25 NOTE — Progress Notes (Addendum)
Inpatient Diabetes Program Recommendations  AACE/ADA: New Consensus Statement on Inpatient Glycemic Control   Target Ranges:  Prepandial:   less than 140 mg/dL      Peak postprandial:   less than 180 mg/dL (1-2 hours)      Critically ill patients:  140 - 180 mg/dL   Results for KAMARAH, BILOTTA (MRN [id number]) as of 04/25/2019 07:58  Ref. Range 04/24/2019 09:05 04/24/2019 11:37 04/24/2019 16:22 04/24/2019 21:21  Glucose-Capillary Latest Ref Range: 70 - 99 mg/dL 481 (H) 856 (H) 314 (H) 275 (H)   Review of Glycemic Control  Diabetes history: DM2 Outpatient Diabetes medications: Tresiba 65-100 units daily, Metformin 500 mg BID Current orders for Inpatient glycemic control: Lantus 65 units QAM, Lantus 15 units QHS, Novolog 16 units TID with meals, Novolog 0-20 units TID with meals, Novolog 0-5 units QHS, Tradjenta 5 mg daily; Decadron 3 mg daily   NOTE: Received secure chat from Pharmacist regarding decreasing steroids to see if recommendations would change (had originally recommended to increase am Lantus dose. However, since steroids are being decreased and glucose is trending well today, would not recommend increasing Lantus at this time. Will continue to follow along and make recommendations as needed based on glycemic trends.  Thanks, Orlando Penner, RN, MSN, CDE Diabetes Coordinator Inpatient Diabetes Program [phone] (Team Pager from 8am to 5pm)

## 2019-04-26 LAB — BASIC METABOLIC PANEL
Anion gap: 8 (ref 5–15)
BUN: 40 mg/dL — ABNORMAL HIGH (ref 6–20)
CO2: 22 mmol/L (ref 22–32)
Calcium: 8.5 mg/dL — ABNORMAL LOW (ref 8.9–10.3)
Chloride: 106 mmol/L (ref 98–111)
Creatinine, Ser: 0.73 mg/dL (ref 0.44–1.00)
GFR calc Af Amer: >60 mL/min (ref >60)
GFR calc non Af Amer: >60 mL/min (ref >60)
Glucose, Bld: 74 mg/dL (ref 70–99)
Potassium: 4.2 mmol/L (ref 3.5–5.1)
Sodium: 136 mmol/L (ref 135–145)

## 2019-04-26 LAB — CBC
HCT: 38.6 % (ref 36.0–46.0)
Hemoglobin: 11.8 g/dL — ABNORMAL LOW (ref 12.0–15.0)
MCH: 25.6 pg — ABNORMAL LOW (ref 26.0–34.0)
MCHC: 30.6 g/dL (ref 30.0–36.0)
MCV: 83.7 fL (ref 80.0–100.0)
Platelets: 242 10*3/uL (ref 150–400)
RBC: 4.61 MIL/uL (ref 3.87–5.11)
RDW: 16.8 % — ABNORMAL HIGH (ref 11.5–15.5)
WBC: 8.1 10*3/uL (ref 4.0–10.5)
nRBC: 0 % (ref 0.0–0.2)

## 2019-04-26 LAB — GLUCOSE, CAPILLARY
Glucose-Capillary: 53 mg/dL — ABNORMAL LOW (ref 70–99)
Glucose-Capillary: 59 mg/dL — ABNORMAL LOW (ref 70–99)
Glucose-Capillary: 70 mg/dL (ref 70–99)
Glucose-Capillary: 70 mg/dL (ref 70–99)
Glucose-Capillary: 206 mg/dL — ABNORMAL HIGH (ref 70–99)
Glucose-Capillary: 185 mg/dL — ABNORMAL HIGH (ref 70–99)

## 2019-04-26 MED ORDER — ENOXAPARIN SODIUM 40 MG/0.4ML ~~LOC~~ SOLN
40.0000 mg | Freq: Two times a day (BID) | SUBCUTANEOUS | Status: DC
  Administered 2019-04-26 – 2019-05-04 (×16): 40 mg via SUBCUTANEOUS
  Filled 2019-04-26 (×16): qty 0.4

## 2019-04-26 MED ORDER — QUETIAPINE FUMARATE 25 MG PO TABS
50.0000 mg | ORAL_TABLET | Freq: Every day | ORAL | Status: DC
  Administered 2019-04-26 – 2019-05-03 (×8): 50 mg via ORAL
  Filled 2019-04-26 (×8): qty 2

## 2019-04-26 MED ORDER — ARIPIPRAZOLE 5 MG PO TABS
5.0000 mg | ORAL_TABLET | Freq: Every day | ORAL | Status: DC
  Administered 2019-04-27 – 2019-05-04 (×8): 5 mg via ORAL
  Filled 2019-04-26 (×8): qty 1

## 2019-04-26 NOTE — Progress Notes (Signed)
Occupational Therapy Treatment Patient Details Name: [name] [name] MRN: [id number] DOB: 11-26-1960 Today's Date: 04/26/2019    History of present illness presented to ER secondary to SOB; admitted for management of acute respiratory disease and sepsis secondary to COVID-19 virus.   OT comments  Ms. Porta was seen for OT treatment on this date. Upon arrival to pt room, pt semi-supine in bed, on 12 LPM HFNC with spO2 of 98%. Pt denies pain and agreeable to OT session. [name] reviews  energy conservation strategies including pursed lip breathing technique and activity pacing to support pt activity tolerance and endurance during seated balance activities and BUE there-ex. Pt comes to sitting EOB with supervision for safety but singificant increased time/effort to perform. Pt able to maintain static and dynamic sitting balance at EOB w/o UE support this date. Engages well with therapeutic activities and exercises described below, but fatigues quickly with O2 sats dropping to low 80's. Pt also frustrated by coughing episodes which further limit ability to engage in PLB during session. Pt returns to bed with min assist from this therapist for BLE management. RN notified of pt performance during session. Pt continues to benefit from skilled OT services to maximize return to PLOF and minimize risk of future falls, injury, caregiver burden, and readmission. Will continue to follow POC. Discharge recommendation remains appropriate.    Follow Up Recommendations  SNF    Equipment Recommendations  None recommended by OT    Recommendations for Other Services      Precautions / Restrictions Precautions Precautions: Fall Precaution Comments: Watch HR/O2 with activity Restrictions Weight Bearing Restrictions: No       Mobility Bed Mobility Overal bed mobility: Needs Assistance Bed Mobility: Supine to Sit;Sit to Supine     Supine to sit: Supervision;HOB elevated Sit to supine: Min assist    General bed mobility comments: Pt comes to sitting EOB with increased time/effort to perform. Min a for BLE to return to bed using log-roll technique. Pt able to independently lower HOB and boost self up using BLE this date.  Transfers                 General transfer comment: Deferred. Pt fatigues quickly when sitting EOB. Requests to return to bed.    Balance Overall balance assessment: Needs assistance Sitting-balance support: Feet supported;No upper extremity supported Sitting balance-Leahy Scale: Good Sitting balance - Comments: Pt steady static sitting, reaching within BOS.                                   ADL either performed or assessed with clinical judgement   ADL Overall ADL's : Needs assistance/impaired                                       General ADL Comments: Pt continues to be limited by decreased activity tolerance and cardiopulmonary status during OT evaluation. Pt requires moderate assist for heavy ADL tasks including bathing and dressing in a seated position. Requires frequent cues for breathing techniques this date. Tolerates sitting EOB for ~10 minutes before becoming too fatigued and short of breath to maintian seated balance. Min assist to return to bed.     Vision Baseline Vision/History: Wears glasses Wears Glasses: Reading only Patient Visual Report: No change from baseline     Perception  Praxis      Cognition Arousal/Alertness: Awake/alert Behavior During Therapy: WFL for tasks assessed/performed Overall Cognitive Status: Within Functional Limits for tasks assessed                                          Exercises Other Exercises Other Exercises: Seated balance activities, BUE  ther-ex: Shoulder flexion, horizontal AB/ADduction, elbow flexion/extension. x5 reps. Pt fatigues quickly. Other Exercises: Consistent energy conservation education and cueing provided throughout funcitonal  mobility and BUE there-ex this date.   Shoulder Instructions       General Comments Pt pulse and spO2 monitored t/o session. Pt on 12 L HFNC. Pulse much improved this date. remains between 90-100's with exertion. spO2 at 98% at rest. With exertion drops to low 80's but pt is able to rebound to upper 90's with cues for PLB. Pt with coughing episodes intermittently t/o session which also impact O2 sats. RN notified of sats at end of session.    Pertinent Vitals/ Pain       Pain Assessment: No/denies pain  Home Living                                          Prior Functioning/Environment              Frequency  Min 1X/week        Progress Toward Goals  OT Goals(current goals can now be found in the care plan section)  Progress towards OT goals: Progressing toward goals  Acute Rehab OT Goals Patient Stated Goal: to get strength back OT Goal Formulation: With patient Time For Goal Achievement: 05/08/19 Potential to Achieve Goals: Good  Plan Discharge plan remains appropriate    Co-evaluation                 AM-PAC OT "6 Clicks" Daily Activity     Outcome Measure   Help from another person eating meals?: None Help from another person taking care of personal grooming?: A Lot Help from another person toileting, which includes using toliet, bedpan, or urinal?: A Little Help from another person bathing (including washing, rinsing, drying)?: A Lot Help from another person to put on and taking off regular upper body clothing?: A Little Help from another person to put on and taking off regular lower body clothing?: A Lot 6 Click Score: 16    End of Session    OT Visit Diagnosis: Other abnormalities of gait and mobility (R26.89);Muscle weakness (generalized) (M62.81)   Activity Tolerance Patient tolerated treatment well;Patient limited by fatigue   Patient Left in bed;with call bell/phone within reach;with bed alarm set   Nurse Communication  Other (comment)(Pt HR/spO2 during session)        Time: 9758-8325 OT Time Calculation (min): 32 min  Charges: OT General Charges $OT Visit: 1 Visit OT Treatments $Therapeutic Activity: 8-22 mins $Therapeutic Exercise: 8-22 mins    Rockney Ghee, M.S., OTR/L Ascom: [phone] 04/26/19, 4:25 PM

## 2019-04-26 NOTE — Progress Notes (Addendum)
Inpatient Diabetes Program Recommendations  AACE/ADA: New Consensus Statement on Inpatient Glycemic Control (2015)  Target Ranges:  Prepandial:   less than 140 mg/dL      Peak postprandial:   less than 180 mg/dL (1-2 hours)      Critically ill patients:  140 - 180 mg/dL   Lab Results  Component Value Date   GLUCAP 70 04/26/2019   HGBA1C 10.7 (H) 03/27/2019    Review of Glycemic Control  Results for [name] [name], [name] (MRN [id number]) as of 04/26/2019 11:25  Ref. Range 04/25/2019 16:55 04/25/2019 21:59 04/26/2019 09:26 04/26/2019 09:29 04/26/2019 10:29  Glucose-Capillary Latest Ref Range: 70 - 99 mg/dL 115 (H) 520 (H) 53 (L) 59 (L) 70    Diabetes history: DM2 Outpatient Diabetes medications: Tresiba 65-100 units QD; metformin 500 mg BID Current orders for Inpatient glycemic control: Novolog 0-20 units daily; 0-5 QHS; Novolog 16 unitd TID with meals; Lantus 65 units QAM; Lantus 15 QHS; Trajenta 5 mg QD; decadron 3 mg QD  Inpatient Diabetes Program Recommendations:     Noted that decadron has decreased.  Patient was hypoglycemic this morning (53mg /dl)  -Please consider Lantus 65 Daily and Discontinue Lantus 15 QHS  Thank you, , RN, BSN Diabetes Coordinator Inpatient Diabetes Program [phone] (team pager from 8a-5p)

## 2019-04-26 NOTE — Progress Notes (Signed)
CRITICAL CARE PROGRESS NOTE    Name: MARGARUITE TOP MRN: [id number] DOB: Aug 19, 1960     LOS: 20   SUBJECTIVE FINDINGS & SIGNIFICANT EVENTS   Patient description:  [name] [name] an 59 y.o.femaleformer smoker with a history of diabetes mellitus, morbid obesity and rheumatoid arthritis diagnosed with COVID-19 on December 4. Admitted to [facility] campus from 8 December through 12 December received 5 days of remdesivir. Also received dexamethasone. She has been found with ARDS   Lines / Drains: PIVx2  Cultures / Sepsis markers: Blood culture X1 positive for staph epi with resistance - possibly contaminant  Antibiotics: None    Protocols / Consultants: Hospitalist/pccm   Tests / Events: 12/28-Weaned down to  75%FiO2, mentation improving.  12/29- clinically imporved, diet advanced, heparin decreased per PTT 12/30 -HFNC75%, self proning 12/31 - continue to wean O2,monitor vitals, patient hypotenisve this am  1/1 - patient smiling this am , eating breakfast, no distress, plan to continue weaning fio2 on high flow 1/2 - no overnight events  1/3 severe hypoxia persists 1/4: participating with PT, O2,flow requirements decreasing 1/6: Tolerating bubble high flow  CC Follow up resp failure with hypoxia  Subjective Interactive, participating with PT, no complaints, dyspnea continues to improve   MEDICATIONS   . vitamin C  500 mg Oral Daily  . atorvastatin  80 mg Oral q1800  . busPIRone  10 mg Oral BID  . calcium-vitamin D  1 tablet Oral Daily  . Chlorhexidine Gluconate Cloth  6 each Topical Daily  . cholecalciferol  1,000 Units Oral Q breakfast  . ciclesonide  4 puff Inhalation BID  . darifenacin  7.5 mg Oral Daily  . dexamethasone  6 mg Oral Daily  . enoxaparin (LOVENOX) injection  0.5  mg/kg Subcutaneous Q12H  . famotidine  40 mg Oral Daily  . FLUoxetine  20 mg Oral Daily  . folic acid  1 mg Oral Daily  . insulin aspart  0-20 Units Subcutaneous TID WC  . insulin aspart  0-5 Units Subcutaneous QHS  . insulin aspart  12 Units Subcutaneous TID WC  . insulin glargine  15 Units Subcutaneous QHS  . insulin glargine  65 Units Subcutaneous Daily  . levothyroxine  88 mcg Oral QAC breakfast  . linagliptin  5 mg Oral Daily  . mouth rinse  15 mL Mouth Rinse BID  . mupirocin ointment   Nasal BID  . pramipexole  0.125 mg Oral BID  . pregabalin  150 mg Oral TID  . Ensure Max Protein  11 oz Oral BID BM  . sodium chloride flush  10-40 mL Intracatheter Q12H  . zinc sulfate  220 mg Oral Daily  . zolpidem  5 mg Oral QHS   PRN Medications: acetaminophen, albuterol, ALPRAZolam, guaiFENesin, lip balm, magic mouthwash, ondansetron (ZOFRAN) IV, oxyCODONE, polyethylene glycol   ALLERGIES   Cephalexin, Codeine, Doxycycline, Propoxyphene, Sulfa antibiotics, Lovenox [enoxaparin sodium], Hydrocodone, and Meropenem   COVID-19 DISASTER DECLARATION:   FULL CONTACT PHYSICAL EXAMINATION WAS NOT POSSIBLE DUE TO TREATMENT OF COVID-19 AND   CONSERVATION OF PERSONAL PROTECTIVE EQUIPMENT, LIMITED EXAM FINDINGS INCLUDE- No edema noted Sitting up in chair Tolerating bubble high flow Lung exam precluded by CAPR   Patient assessed or the symptoms described in the history of present illness.   In the context of the Global COVID-19 pandemic, which necessitated consideration that the patient might be at risk for infection with the SARS-CoV-2 virus that causes COVID-19, Institutional protocols and algorithms that pertain to  the evaluation of patients at risk for COVID-19 are in a state of rapid change based on information released by regulatory bodies including the CDC and federal and state organizations. These policies and algorithms were followed during the patient's care while in hospital.      PHYSICAL EXAMINATION   Vital Signs: Temp:  [97.2 F (36.2 C)-98.6 F (37 C)] 98.4 F (36.9 C) (01/07 1200) Pulse Rate:  [51-77] 70 (01/07 1400) Resp:  [14-24] 21 (01/07 1400) BP: (92-123)/(57-82) 109/62 (01/07 1400) SpO2:  [94 %-100 %] 98 % (01/07 1402)  Hemodynamic parameters:   Intake/Output: 01/06 0701 - 01/07 0700 In: 824 [P.O.:824] Out: 1700 [Urine:1700]  Ventilator  Settings:      LAB RESULTS:  Basic Metabolic Panel: Recent Labs  Lab 04/22/19 0435 04/23/19 0526 04/24/19 0502 04/25/19 0626 04/26/19 0412  NA 136 133* 136 136 136  K 4.0 4.2 3.9 4.0 4.2  CL 100 97* 101 103 106  CO2 26 29 27 25 22   GLUCOSE 122* 181* 180* 115* 74  BUN 66* 62* 58* 49* 40*  CREATININE 0.95 0.90 0.85 0.91 0.73  CALCIUM 9.2 9.1 8.8* 8.8* 8.5*   Liver Function Tests: Recent Labs  Lab 04/23/19 0526  AST 11*  ALT 11  ALKPHOS 53  BILITOT 0.8  PROT 7.3  ALBUMIN 2.6*   No results for input(s): LIPASE, AMYLASE in the last 168 hours. No results for input(s): AMMONIA in the last 168 hours. CBC: Recent Labs  Lab 04/22/19 0435 04/23/19 0526 04/24/19 0502 04/25/19 0626 04/26/19 0731  WBC 16.6* 10.1 8.3 6.6 8.1  NEUTROABS 13.5* 8.6*  --   --   --   HGB 12.1 11.5* 11.3* 10.9* 11.8*  HCT 36.2 35.4* 35.7* 34.6* 38.6  MCV 78.0* 79.2* 81.9 82.8 83.7  PLT 246 213 219 206 242   Cardiac Enzymes: No results for input(s): CKTOTAL, CKMB, CKMBINDEX, TROPONINI in the last 168 hours. BNP: Invalid input(s): POCBNP CBG: Recent Labs  Lab 04/26/19 0926 04/26/19 0929 04/26/19 1029 04/26/19 1200 04/26/19 1600  GLUCAP 53* 59* 70 70 206*     IMAGING RESULTS:  Imaging: No results found.    ASSESSMENT AND PLAN   Acute hypoxic respiratory failure Late pulmonary phase of COVID-19 disease Bilateral pulmonary infiltrates Low suspicion for bacterial infection as procalcitonin and clinical picture not suggestive  High flow O2 to maintain oxygen saturations between 88 to  92% Continue anti-inflammatory therapy On dexamethasone 6 mg daily Supplement magnesium if needed Supplementmultivitamin Close monitoring in stepdown/ICU Tolerating bubble high flow can transition to MedSurg  Hypercoagulable state No evidence of large PE On Lovenox higher dose  Diabetes mellitus type 2 Controlled Subcu insulin Close monitoring Carb controlled diet  Acute kidney injury Resolved Follow renal panel  Rheumatoid arthritis Recent Actemra dosing This issue adds complexity to her management Actemra discontinued 12/18 by her rheumatologist Received last Actemra dose in week PTA  Morbid obesity BMI 52 This issue adds complexity to her management  Will need isolation for 21 days post admission which will be 8 January (tomorrow)  C. Derrill Kay, MD [facility]  *This note was dictated using voice recognition software/Dragon.  Despite best efforts to proofread, errors can occur which can change the meaning.  Any change was purely unintentional.

## 2019-04-26 NOTE — Progress Notes (Signed)
Patient is AOx4.  Vitals WDL with normotensive pressures.  HFNC humidified at 15L.  Saturations are 91-92%.  Patient mobility with ADL's is increasing.  Held her insulin this morning d/t evaluation blood suagr of 59.  Breakfast was delivered and lantus and Novalog was held.  Reassessment of blood sugar recorded at 70.    Patient began her reduced steroid therapy this morning with Dexamethasone 3mg  PO.

## 2019-04-27 DIAGNOSIS — R0902 Hypoxemia: Secondary | ICD-10-CM

## 2019-04-27 DIAGNOSIS — Z515 Encounter for palliative care: Secondary | ICD-10-CM

## 2019-04-27 DIAGNOSIS — J984 Other disorders of lung: Secondary | ICD-10-CM

## 2019-04-27 DIAGNOSIS — Z7189 Other specified counseling: Secondary | ICD-10-CM

## 2019-04-27 LAB — CBC
HCT: 33.6 % — ABNORMAL LOW (ref 36.0–46.0)
Hemoglobin: 10.4 g/dL — ABNORMAL LOW (ref 12.0–15.0)
MCH: 26 pg (ref 26.0–34.0)
MCHC: 31 g/dL (ref 30.0–36.0)
MCV: 84 fL (ref 80.0–100.0)
Platelets: 180 10*3/uL (ref 150–400)
RBC: 4 MIL/uL (ref 3.87–5.11)
RDW: 17.1 % — ABNORMAL HIGH (ref 11.5–15.5)
WBC: 6.2 10*3/uL (ref 4.0–10.5)
nRBC: 0 % (ref 0.0–0.2)

## 2019-04-27 LAB — GLUCOSE, CAPILLARY
Glucose-Capillary: 69 mg/dL — ABNORMAL LOW (ref 70–99)
Glucose-Capillary: 212 mg/dL — ABNORMAL HIGH (ref 70–99)
Glucose-Capillary: 216 mg/dL — ABNORMAL HIGH (ref 70–99)

## 2019-04-27 LAB — BASIC METABOLIC PANEL
Anion gap: 8 (ref 5–15)
BUN: 36 mg/dL — ABNORMAL HIGH (ref 6–20)
CO2: 25 mmol/L (ref 22–32)
Calcium: 8.3 mg/dL — ABNORMAL LOW (ref 8.9–10.3)
Chloride: 107 mmol/L (ref 98–111)
Creatinine, Ser: 0.89 mg/dL (ref 0.44–1.00)
GFR calc Af Amer: >60 mL/min (ref >60)
GFR calc non Af Amer: >60 mL/min (ref >60)
Glucose, Bld: 96 mg/dL (ref 70–99)
Potassium: 3.9 mmol/L (ref 3.5–5.1)
Sodium: 140 mmol/L (ref 135–145)

## 2019-04-27 MED ORDER — INSULIN GLARGINE 100 UNIT/ML ~~LOC~~ SOLN
50.0000 [IU] | Freq: Every day | SUBCUTANEOUS | Status: DC
  Administered 2019-04-28 – 2019-04-30 (×3): 50 [IU] via SUBCUTANEOUS
  Filled 2019-04-27 (×3): qty 0.5

## 2019-04-27 MED ORDER — INSULIN ASPART 100 UNIT/ML ~~LOC~~ SOLN
6.0000 [IU] | Freq: Three times a day (TID) | SUBCUTANEOUS | Status: DC
  Administered 2019-04-27 – 2019-05-04 (×18): 6 [IU] via SUBCUTANEOUS
  Filled 2019-04-27 (×18): qty 1

## 2019-04-27 NOTE — Progress Notes (Signed)
PT Cancellation Note  Patient Details Name: [name] [name] MRN: [id number] DOB: 04/05/1961   Cancelled Treatment:    Reason Eval/Treat Not Completed: (Treatment session attempted.  Patient politely declined session due to fatigue.  Encouraged bed-level activitiy, OOB to chair; continued to decline.  Will continue efforts next date as medically appropriate and available.)   Chinenye Katzenberger H. Manson Passey, PT, DPT, NCS 04/27/19, 3:30 PM [phone]

## 2019-04-27 NOTE — Progress Notes (Signed)
CRITICAL CARE PROGRESS NOTE    Name: [name] [name] MRN: [id number] DOB: 10-18-1960     LOS: 21   SUBJECTIVE FINDINGS & SIGNIFICANT EVENTS   Patient description:  [name] [name] an 59 y.o.femaleformer smoker with a history of diabetes mellitus, morbid obesity and rheumatoid arthritis diagnosed with COVID-19 on December 4. Admitted to [facility] from 8 December through 12 December received 5 days of remdesivir. Also received dexamethasone. She has been found with ARDS   Lines / Drains: PIVx2  Cultures / Sepsis markers: Blood culture X1 positive for staph epi with resistance - possibly contaminant  Antibiotics: None    Protocols / Consultants: Hospitalist/pccm   Tests / Events: 12/28-Weaned down to  75%FiO2, mentation improving.  -12/29- clinically imporved, diet advanced, heparin decreased per PTT 12/30 -HFNC75%, self proning 12/31-continue to wean O2,monitor vitals, patient hypotensive this am  1/1 - patient smiling this am , eating breakfast, no distress, plan to continue weaning fio2 on high flow 1/2 - no overnight events  1/3 severe hypoxia 1/7 hypoxia slowly improving  CC Follow up resp failure  HPI Remains hypoxic slowly improving Feels better, SOB and WOB better    PAST MEDICAL HISTORY   Past Medical History:  Diagnosis Date  . Abdominal wall hernia 01/29/2013  . Anxiety   . Arthritis    Rheumatoid  . C. difficile colitis   . Chronic diastolic heart failure (HCC)   . Depression   . Diabetes mellitus    states no meds or diet restrictions  at present  . Diastolic CHF (HCC)   . Esophagitis   . Fluid retention   . GERD (gastroesophageal reflux disease)   . Hiatal hernia   . Hypertension   . Hypokalemia due to loss of potassium 10/21/2015   Overview:   Associated with 3 weeks of diarrhea  And QT prolongation.  . Hypothyroidism   . IBS (irritable bowel syndrome)   . Moderate episode of recurrent major depressive disorder (HCC) 06/03/2004  . Morbid obesity (HCC)   . MRSA (methicillin resistant Staphylococcus aureus) infection 11/2017   left inner thigh abcess  . Neurogenic bladder    has pacemaker  . Neuropathy   . Obesity   . Panic attacks   . Rheumatoid arthritis (HCC)   . Sleep apnea    STATES SEVERE, CANT TOLERATE MASK- LAST STUDY YEARS AGO     SURGICAL HISTORY   Past Surgical History:  Procedure Laterality Date  . ABDOMINAL HYSTERECTOMY    . CHOLECYSTECTOMY    . DG GREAT TOE RIGHT FOOT  02/23/2018  . EYE SURGERY     bilateral cataract extraction with IOL  . HERNIA REPAIR     ventral hernia with strangulation  . LAPAROSCOPIC GASTRIC BANDING  03/20/07  . TONSILLECTOMY    . TUBAL LIGATION        MEDICATIONS   Current Medication:  Current Facility-Administered Medications:  .  acetaminophen (TYLENOL) tablet 650 mg, 650 mg, Oral, Q6H PRN, Lorretta Harp, MD, 650 mg at 04/10/19 0224 .  albuterol (VENTOLIN HFA) 108 (90 Base) MCG/ACT inhaler 2 puff, 2 puff, Inhalation, Q4H PRN, Lorretta Harp, MD .  ALPRAZolam Prudy Feeler) tablet 0.5 mg, 0.5 mg, Oral, TID PRN, Erin Fulling, MD, 0.5 mg at 04/17/19 2127 .  ARIPiprazole (ABILIFY) tablet 5 mg, 5 mg, Oral, Daily, Salena Saner, MD .  ascorbic acid (VITAMIN C) tablet 500 mg, 500 mg, Oral, Daily, Lorretta Harp, MD, 500 mg at 04/26/19 0914 .  atorvastatin (LIPITOR) tablet 80 mg, 80 mg, Oral, q1800, Tyler Pita, MD, 80 mg at 04/26/19 2208 .  busPIRone (BUSPAR) tablet 10 mg, 10 mg, Oral, BID, Ivor Costa, MD, 10 mg at 04/26/19 2238 .  calcium-vitamin D (OSCAL WITH D) 500-200 MG-UNIT per tablet 1 tablet, 1 tablet, Oral, Daily, Ivor Costa, MD, 1 tablet at 04/26/19 0915 .  Chlorhexidine Gluconate Cloth 2 % PADS 6 each, 6 each, Topical, Daily, Danford, Suann Larry, MD, 6 each at 04/26/19  1305 .  cholecalciferol (VITAMIN D3) tablet 1,000 Units, 1,000 Units, Oral, Q breakfast, Ivor Costa, MD, 1,000 Units at 04/26/19 [phone] .  ciclesonide (ALVESCO) 160 MCG/ACT inhaler 4 puff, 4 puff, Inhalation, BID, Tyler Pita, MD, 4 puff at 04/26/19 2211 .  darifenacin (ENABLEX) 24 hr tablet 7.5 mg, 7.5 mg, Oral, Daily, Ivor Costa, MD, 7.5 mg at 04/26/19 0918 .  dexamethasone (DECADRON) tablet 3 mg, 3 mg, Oral, Daily, Tyler Pita, MD, 3 mg at 04/26/19 0920 .  enoxaparin (LOVENOX) injection 40 mg, 40 mg, Subcutaneous, Q12H, Dallie Piles, RPH, 40 mg at 04/26/19 2210 .  famotidine (PEPCID) tablet 40 mg, 40 mg, Oral, Daily, Tyler Pita, MD, 40 mg at 04/26/19 0915 .  FLUoxetine (PROZAC) capsule 20 mg, 20 mg, Oral, Daily, Ivor Costa, MD, 20 mg at 04/26/19 0918 .  folic acid (FOLVITE) tablet 1 mg, 1 mg, Oral, Daily, Ivor Costa, MD, 1 mg at 04/26/19 0915 .  guaiFENesin (ROBITUSSIN) 100 MG/5ML solution 100 mg, 5 mL, Oral, Q4H PRN, Frederik Pear, MD, 100 mg at 04/27/19 0646 .  insulin aspart (novoLOG) injection 0-20 Units, 0-20 Units, Subcutaneous, TID WC, Ottie Glazier, MD, 7 Units at 04/26/19 1752 .  insulin aspart (novoLOG) injection 0-5 Units, 0-5 Units, Subcutaneous, QHS, Awilda Bill, NP, 3 Units at 04/24/19 2136 .  insulin aspart (novoLOG) injection 16 Units, 16 Units, Subcutaneous, TID WC, Gerald Dexter, RPH, 16 Units at 04/26/19 1752 .  insulin glargine (LANTUS) injection 65 Units, 65 Units, Subcutaneous, Daily, Danford, Suann Larry, MD, Stopped at 04/26/19 [phone] .  levothyroxine (SYNTHROID) tablet 88 mcg, 88 mcg, Oral, QAC breakfast, Ivor Costa, MD, 88 mcg at 04/26/19 0917 .  linagliptin (TRADJENTA) tablet 5 mg, 5 mg, Oral, Daily, Danford, Suann Larry, MD, 5 mg at 04/26/19 0917 .  lip balm (BLISTEX) ointment, , Topical, PRN, Darel Hong D, NP .  magic mouthwash, 15 mL, Oral, TID PRN, Tyler Pita, MD, 15 mL at 04/10/19 0534 .  MEDLINE mouth rinse,  15 mL, Mouth Rinse, BID, Awilda Bill, NP, 15 mL at 04/26/19 2212 .  ondansetron (ZOFRAN) injection 4 mg, 4 mg, Intravenous, Q8H PRN, Ivor Costa, MD, 4 mg at 04/08/19 1104 .  oxyCODONE (Oxy IR/ROXICODONE) immediate release tablet 5 mg, 5 mg, Oral, Q6H PRN, Ivor Costa, MD, 5 mg at 04/25/19 2019 .  polyethylene glycol (MIRALAX / GLYCOLAX) packet 17 g, 17 g, Oral, Daily PRN, Darel Hong D, NP .  pramipexole (MIRAPEX) tablet 0.125 mg, 0.125 mg, Oral, BID, Ivor Costa, MD, 0.125 mg at 04/26/19 2237 .  pregabalin (LYRICA) capsule 150 mg, 150 mg, Oral, TID, Ivor Costa, MD, 150 mg at 04/26/19 2208 .  protein supplement (ENSURE MAX) liquid, 11 oz, Oral, BID BM, Aleskerov, Fuad, MD, 11 oz at 04/24/19 1240 .  QUEtiapine (SEROQUEL) tablet 50 mg, 50 mg, Oral, QHS, Tyler Pita, MD, 50 mg at 04/26/19 2208 .  sodium chloride flush (NS) 0.9 % injection 10-40 mL, 10-40 mL,  Intracatheter, Q12H, Erin Fulling, MD, 10 mL at 04/26/19 2209 .  zinc sulfate capsule 220 mg, 220 mg, Oral, Daily, Lorretta Harp, MD, 220 mg at 04/26/19 0915 .  zolpidem (AMBIEN) tablet 5 mg, 5 mg, Oral, QHS, Lorretta Harp, MD, 5 mg at 04/26/19 2208    ALLERGIES   Cephalexin, Codeine, Doxycycline, Propoxyphene, Sulfa antibiotics, Lovenox [enoxaparin sodium], Hydrocodone, and Meropenem    Review of Systems:  Gen:  Denies  fever, sweats, chills weight loss  HEENT: Denies blurred vision, double vision, ear pain, eye pain, hearing loss, nose bleeds, sore throat Cardiac:  No dizziness, chest pain or heaviness, chest tightness,edema, No JVD Resp:   No cough, -sputum production, +shortness of breath,-wheezing, -hemoptysis,  Gi: Denies swallowing difficulty, stomach pain, nausea or vomiting, diarrhea, constipation, bowel incontinence Gu:  Denies bladder incontinence, burning urine Ext:   Denies Joint pain, stiffness or swelling Skin: Denies  skin rash, easy bruising or bleeding or hives Endoc:  Denies polyuria, polydipsia , polyphagia  or weight change Psych:   Denies depression, insomnia or hallucinations  Other:  All other systems negative    PHYSICAL EXAMINATION   Vital Signs: Temp:  [98 F (36.7 C)-98.4 F (36.9 C)] 98 F (36.7 C) (01/08 0200) Pulse Rate:  [59-85] 59 (01/08 0600) Resp:  [15-24] 17 (01/08 0600) BP: (77-138)/(44-86) 87/66 (01/08 0600) SpO2:  [90 %-100 %] 98 % (01/08 0600)  Physical Examination:   GENERAL:NAD, no fevers, chills, no weakness no fatigue HEAD: Normocephalic, atraumatic.  EYES: PERLA, EOMI No scleral icterus.  MOUTH: Moist mucosal membrane.  EAR, NOSE, THROAT: Clear without exudates. No external lesions.  NECK: Supple. No thyromegaly.  No JVD.  PULMONARY: CTA B/L no wheezing, rhonchi, crackles CARDIOVASCULAR: S1 and S2. Regular rate and rhythm. No murmurs GASTROINTESTINAL: Soft, nontender, nondistended. Positive bowel sounds.  MUSCULOSKELETAL: No swelling, clubbing, or edema.  NEUROLOGIC: No gross focal neurological deficits. 5/5 strength all extremities SKIN: No ulceration, lesions, rashes, or cyanosis.  PSYCHIATRIC: Insight, judgment intact. -depression -anxiety ALL OTHER ROS ARE NEGATIVE      PERTINENT DATA     Infusions:  Scheduled Medications: . ARIPiprazole  5 mg Oral Daily  . vitamin C  500 mg Oral Daily  . atorvastatin  80 mg Oral q1800  . busPIRone  10 mg Oral BID  . calcium-vitamin D  1 tablet Oral Daily  . Chlorhexidine Gluconate Cloth  6 each Topical Daily  . cholecalciferol  1,000 Units Oral Q breakfast  . ciclesonide  4 puff Inhalation BID  . darifenacin  7.5 mg Oral Daily  . dexamethasone  3 mg Oral Daily  . enoxaparin (LOVENOX) injection  40 mg Subcutaneous Q12H  . famotidine  40 mg Oral Daily  . FLUoxetine  20 mg Oral Daily  . folic acid  1 mg Oral Daily  . insulin aspart  0-20 Units Subcutaneous TID WC  . insulin aspart  0-5 Units Subcutaneous QHS  . insulin aspart  16 Units Subcutaneous TID WC  . insulin glargine  65 Units Subcutaneous  Daily  . levothyroxine  88 mcg Oral QAC breakfast  . linagliptin  5 mg Oral Daily  . mouth rinse  15 mL Mouth Rinse BID  . pramipexole  0.125 mg Oral BID  . pregabalin  150 mg Oral TID  . Ensure Max Protein  11 oz Oral BID BM  . QUEtiapine  50 mg Oral QHS  . sodium chloride flush  10-40 mL Intracatheter Q12H  . zinc sulfate  220 mg  Oral Daily  . zolpidem  5 mg Oral QHS   PRN Medications: acetaminophen, albuterol, ALPRAZolam, guaiFENesin, lip balm, magic mouthwash, ondansetron (ZOFRAN) IV, oxyCODONE, polyethylene glycol Hemodynamic parameters:   Intake/Output: 01/07 0701 - 01/08 0700 In: -  Out: 700 [Urine:700]  Ventilator  Settings:      LAB RESULTS:  Basic Metabolic Panel: Recent Labs  Lab 04/23/19 0526 04/24/19 0502 04/25/19 0626 04/26/19 0412 04/27/19 0528  NA 133* 136 136 136 140  K 4.2 3.9 4.0 4.2 3.9  CL 97* 101 103 106 107  CO2 29 27 25 22 25   GLUCOSE 181* 180* 115* 74 96  BUN 62* 58* 49* 40* 36*  CREATININE 0.90 0.85 0.91 0.73 0.89  CALCIUM 9.1 8.8* 8.8* 8.5* 8.3*   Liver Function Tests: Recent Labs  Lab 04/23/19 0526  AST 11*  ALT 11  ALKPHOS 53  BILITOT 0.8  PROT 7.3  ALBUMIN 2.6*   No results for input(s): LIPASE, AMYLASE in the last 168 hours. No results for input(s): AMMONIA in the last 168 hours. CBC: Recent Labs  Lab 04/22/19 0435 04/23/19 0526 04/24/19 0502 04/25/19 0626 04/26/19 0731 04/27/19 0528  WBC 16.6* 10.1 8.3 6.6 8.1 6.2  NEUTROABS 13.5* 8.6*  --   --   --   --   HGB 12.1 11.5* 11.3* 10.9* 11.8* 10.4*  HCT 36.2 35.4* 35.7* 34.6* 38.6 33.6*  MCV 78.0* 79.2* 81.9 82.8 83.7 84.0  PLT 246 213 219 206 242 180   Cardiac Enzymes: No results for input(s): CKTOTAL, CKMB, CKMBINDEX, TROPONINI in the last 168 hours. BNP: Invalid input(s): POCBNP CBG: Recent Labs  Lab 04/26/19 0929 04/26/19 1029 04/26/19 1200 04/26/19 1600 04/26/19 2206  GLUCAP 59* 70 70 206* 185*     IMAGING RESULTS:  Imaging: No results  found.     ASSESSMENT AND PLAN    ACUTE HYPOXIC RESP FAILURE SLOWLY IMPROVING OXYGEN AS NEEDED On wall High FLOW Mayo 12 LNC 02 sat 98%   Severe COVID-19 infection, ARDS and pneumonia/pneumonitis Continue IV steroids   If needed IV remdisivir completed Aggressive pulm toilet recommended OOB to chair as tolerated  Pulmonary hygiene Continue proning as tolerated due to severe hypoxia   Maintain airborne and contact precautions    ELECTROLYTES -follow labs as needed -replace as needed -pharmacy consultation and following  ENDO - ICU hypoglycemic\Hyperglycemia protocol -check FSBS per protocol    DVT/GI PRX ordered TRANSFUSIONS AS NEEDED MONITOR FSBS ASSESS the need for LABS as needed    06/24/19, M.D.  [facility]  Medical Director [facility] Medical Director [facility] Cardio-Pulmonary Department

## 2019-04-27 NOTE — Progress Notes (Signed)
Inpatient Diabetes Program Recommendations  AACE/ADA: New Consensus Statement on Inpatient Glycemic Control (2015)  Target Ranges:  Prepandial:   less than 140 mg/dL      Peak postprandial:   less than 180 mg/dL (1-2 hours)      Critically ill patients:  140 - 180 mg/dL   Lab Results  Component Value Date   GLUCAP 69 (L) 04/27/2019   HGBA1C 10.7 (H) 03/27/2019    Review of Glycemic Control Results for [name] [name], [name] [name] (MRN [id number]) as of 04/27/2019 13:02  Ref. Range 04/26/2019 10:29 04/26/2019 12:00 04/26/2019 16:00 04/26/2019 22:06 04/27/2019 11:58  Glucose-Capillary Latest Ref Range: 70 - 99 mg/dL 70 70 998 (H) 069 (H) 69 (L)   Diabetes history: DM 2 Outpatient Diabetes medications: Tresiba 65-100 units QD; metformin 500 mg BID Current orders for Inpatient glycemic control: Novolog 0-20 units daily; 0-5 QHS; Novolog 16 unitd TID with meals; Lantus 65 units QAM; Trajenta 5 mg QD; Decadron 3 mg QD  Inpatient Diabetes Program Recommendations:     Please consider further reduction of Lantus to 50 units daily and reduce Novolog meal coverage to 6 units tid with meals.   Thanks  Beryl Meager, RN, BC-ADM Inpatient Diabetes Coordinator Pager [phone] (8a-5p)

## 2019-04-27 NOTE — Progress Notes (Signed)
PALLIATIVE NOTE:  Patient is awake, A&O x3. She is on the phone with her mother who is also admitted with COVID. She shares she is worried about her as she is being treated for pneumonia and doctors state it will take quite a bit of time for her to recover. Therapeutic listening and support given.   Patient remains on precautions given her COVID + diagnosis and use of medications for RA making her immunocompromised.   She reports she is feeling much better day by day. Some shortness of breath at times. She continues on HFNC at this time.   Patient remains hopeful for improvement.   The above conversation was completed via telephone due to the visitor restrictions during the COVID-19 pandemic. Thorough chart review and discussion with necessary members of the care team was completed as part of assessment. All issues were discussed and addressed but no physical exam was performed.  Total Time: 25 min.   Greater than 50%  of this time was spent counseling and coordinating care related to the above assessment and plan.  Willette Alma, AGPCNP-BC Palliative Medicine Team  Phone: [phone] Fax: [phone] Pager: [phone] Amion: Dorris Carnes. [place]

## 2019-04-27 NOTE — Progress Notes (Signed)
Occupational Therapy Treatment Patient Details Name: [name] [name] MRN: [id number] DOB: 29-May-1960 Today's Date: 04/27/2019    History of present illness presented to ER secondary to SOB; admitted for management of acute respiratory disease and sepsis secondary to COVID-19 virus.   OT comments  Ms. Hoelzer was seen for OT tx session this date. Pt received semi-supine in bed on 12 L/m HFNC. Pt spO2 remains >90% t/o treatment session, however, pt declines OOB/mobility attempts this date. She endorses significant fatigue after [facility] am. Pt agreeable to discussion of energy conservation strategies including review of the "4 p's of energy conservation" (planning, prioritizing, pacing, and positioning) to maximize safety and functional independence during ADL tasks. Pt and provider also discuss adaptive equipment to support energy conservation. Pt return verbalizes understanding of education provided but would benefit from opportunity to implement during ADL tasks. Adaptive equipment catalog left for pt to review. Pt wishes to discuss AE options in more detail at next OT session. Pt progressing toward OT goals and continues to benefit from skilled OT services to maximize return to PLOF and minimize risk of future falls, injury, caregiver burden, and readmission. Will continue to follow POC. Discharge recommendation remains appropriate.    Follow Up Recommendations  SNF    Equipment Recommendations  None recommended by OT    Recommendations for Other Services      Precautions / Restrictions Precautions Precautions: Fall Precaution Comments: Watch HR/O2 with activity Restrictions Weight Bearing Restrictions: No       Mobility Bed Mobility               General bed mobility comments: Deferred. Pt endorses significant fatigue after transfer to [facility] earlier this am. Declines further mobility. Agreeable to attempting at a later time/date.  Transfers                       Balance Overall balance assessment: Needs assistance                                         ADL either performed or assessed with clinical judgement   ADL Overall ADL's : Needs assistance/impaired                                       General ADL Comments: Pt continues to be limited by decreased activity tolerance and cardiopulmonary status during OT evaluation. Pt requires moderate assist for heavy ADL tasks including bathing and dressing in a seated position. Requires frequent cues for breathing techniques this date. Endorses significant fatigue after transfer to [facility] earlier this date. Declines mobility attempts this session. Agreeable to reviewing energy conservation handout and AE catalog.     Vision Baseline Vision/History: Wears glasses Wears Glasses: Reading only Patient Visual Report: No change from baseline     Perception     Praxis      Cognition Arousal/Alertness: Awake/alert Behavior During Therapy: WFL for tasks assessed/performed Overall Cognitive Status: Within Functional Limits for tasks assessed                                          Exercises Other Exercises Other Exercises: OT engages pt in  energy conservation discussion. Reviews ECS handout and educates pt on the "4Ps" of ECS. Pt return verbalizes understanding of education provided. Pt return demos PLB technique from prior sessions.  AE catalog also provided for pt to review and discuss AE options at next session.   Shoulder Instructions       General Comments Pt on 12L/m HFNC spO2 remains WFLs >90% t/o OT session however pt declines any mobility attempts due to fatigue and SOB.    Pertinent Vitals/ Pain       Pain Assessment: No/denies pain  Home Living                                          Prior Functioning/Environment              Frequency  Min 1X/week        Progress Toward  Goals  OT Goals(current goals can now be found in the care plan section)  Progress towards OT goals: Progressing toward goals  Acute Rehab OT Goals Patient Stated Goal: to get strength back OT Goal Formulation: With patient Time For Goal Achievement: 05/08/19 Potential to Achieve Goals: Good  Plan Discharge plan remains appropriate    Co-evaluation                 AM-PAC OT "6 Clicks" Daily Activity     Outcome Measure   Help from another person eating meals?: None Help from another person taking care of personal grooming?: A Lot Help from another person toileting, which includes using toliet, bedpan, or urinal?: A Little Help from another person bathing (including washing, rinsing, drying)?: A Lot Help from another person to put on and taking off regular upper body clothing?: A Little Help from another person to put on and taking off regular lower body clothing?: A Lot 6 Click Score: 16    End of Session    OT Visit Diagnosis: Other abnormalities of gait and mobility (R26.89);Muscle weakness (generalized) (M62.81)   Activity Tolerance Patient limited by fatigue   Patient Left in bed;with call bell/phone within reach;with bed alarm set   Nurse Communication          Time: 1610-9604 OT Time Calculation (min): 16 min  Charges: OT General Charges $OT Visit: 1 Visit OT Treatments $Self Care/Home Management : 8-22 mins  Shara Blazing, M.S., OTR/L Ascom: [phone] 04/27/19, 3:14 PM

## 2019-04-28 LAB — CBC
HCT: 32.5 % — ABNORMAL LOW (ref 36.0–46.0)
Hemoglobin: 10.1 g/dL — ABNORMAL LOW (ref 12.0–15.0)
MCH: 25.9 pg — ABNORMAL LOW (ref 26.0–34.0)
MCHC: 31.1 g/dL (ref 30.0–36.0)
MCV: 83.3 fL (ref 80.0–100.0)
Platelets: 170 10*3/uL (ref 150–400)
RBC: 3.9 MIL/uL (ref 3.87–5.11)
RDW: 17.3 % — ABNORMAL HIGH (ref 11.5–15.5)
WBC: 5.8 10*3/uL (ref 4.0–10.5)
nRBC: 0 % (ref 0.0–0.2)

## 2019-04-28 LAB — GLUCOSE, CAPILLARY
Glucose-Capillary: 86 mg/dL (ref 70–99)
Glucose-Capillary: 114 mg/dL — ABNORMAL HIGH (ref 70–99)
Glucose-Capillary: 236 mg/dL — ABNORMAL HIGH (ref 70–99)
Glucose-Capillary: 246 mg/dL — ABNORMAL HIGH (ref 70–99)

## 2019-04-28 LAB — BASIC METABOLIC PANEL
Anion gap: 4 — ABNORMAL LOW (ref 5–15)
BUN: 33 mg/dL — ABNORMAL HIGH (ref 6–20)
CO2: 25 mmol/L (ref 22–32)
Calcium: 8.3 mg/dL — ABNORMAL LOW (ref 8.9–10.3)
Chloride: 108 mmol/L (ref 98–111)
Creatinine, Ser: 0.85 mg/dL (ref 0.44–1.00)
GFR calc Af Amer: >60 mL/min (ref >60)
GFR calc non Af Amer: >60 mL/min (ref >60)
Glucose, Bld: 96 mg/dL (ref 70–99)
Potassium: 3.7 mmol/L (ref 3.5–5.1)
Sodium: 137 mmol/L (ref 135–145)

## 2019-04-28 NOTE — Progress Notes (Signed)
PROGRESS NOTE    [name] [name]  ZTI:[id number]  DOB: Jun 08, 1960  DOA: 04/06/2019 PCP: [facility] Specialists:   Hospital course:  [name] [name] an 59 y.o.femaleformer smoker with a history of diabetes mellitus, morbid obesity and rheumatoid arthritis diagnosed with COVID-19 on December 4 2 subsequently developed ARDS.  Admitted to [facility] campus from 8 December through 12 December received 5 days of remdesivir and dexamethasone.  She has slowly been weaned down with apparent slow resolution of ARDS.  Subjective:  Patient's main concern today is painful coughing which is worsened with working with physical therapy and exertion.  Patient notes is a dry cough.  Notes she has chest pain tenderness from coughing so much.  Patient admits to being very weak in general but is happy that she has pulled through.  She states she is eating reasonably well, is having regular bowel movements.  No nausea or vomiting.  No diarrhea.  No abdominal pain.   Objective: Vitals:   04/28/19 0752 04/28/19 0817 04/28/19 0914 04/28/19 1514  BP: 118/68  102/63 111/64  Pulse: 82  80 73  Resp: (!) 21  18 20   Temp: (!) 97.3 F (36.3 C)  98.1 F (36.7 C) 97.9 F (36.6 C)  TempSrc: Axillary  Oral Oral  SpO2: 95% 94% 97% 100%  Weight:      Height:        Intake/Output Summary (Last 24 hours) at 04/28/2019 1821 Last data filed at 04/28/2019 0200 Gross per 24 hour  Intake --  Output 100 ml  Net -100 ml   Filed Weights   04/06/19 1000  Weight: (!) 145.2 kg    Exam:  General: Mildly friendly obese female sitting up in bed in no acute respiratory distress.  She is able to speak in full sentences without difficulty Constitutional: Alert and awake, oriented x3, not in any acute distress. Eyes: sclera anicteric, conjuctiva clear, no lid lag, no exophthalmos, EOMI CVS: S1-S2 clear, no murmur rubs or gallops, no LE edema, normal pedal pulses  Respiratory: No increased work  of breathing, decreased breath sounds bilaterally with rales at bases likely secondary to decrease in story effort.04/08/19  GI: NABS, soft, NT, ND, no palpable masses.  LE: No edema. No cyanosis Neuro: A/O x 3, Moving all extremities equally with normal strength, CN 3-12 intact, grossly nonfocal.  Psych: patient is logical and coherent, judgement and insight appear normal, mood and affect appropriate to situation. Skin: no rashes or lesions or ulcers,   Assessment & Plan:   Acute hypoxic respiratory failure with ARDS secondary to COVID-19 infection Patient much improved per report. Continue oxygenation with  8 to 11 L oxygen per [place], O2 sats 95 to 100%. Wean down as tolerated. Status post treatment with remdesivir  She is continued on Decadron 3 mg daily  Weakness Combination of prolonged sickness requiring bedrest as well as COVID-19 infection Patient will need ongoing therapy with physical therapy to help improve mobility  DM2 Patient is on linagliptin and SSI AC at bedtime Follow fingersticks and modify as warranted  Hypothyroid Continue Synthroid  Hyperlipidemia Continue statin  Anxiety and depression Continue BuSpar, Abilify, Xanax, fluoxetine   DVT prophylaxis: Lovenox Code Status: Full code Family Communication: Patient states she is in touch with her family Disposition Plan: TBD barriers to discharge are ongoing hypoxia and ongoing assessment of this disposition.   Consultants:  Critical care medicine  Procedures:  None  Antimicrobials:  None  Data Reviewed: Basic  Metabolic Panel: Recent Labs  Lab 04/24/19 0502 04/25/19 0626 04/26/19 0412 04/27/19 0528 04/28/19 0445  NA 136 136 136 140 137  K 3.9 4.0 4.2 3.9 3.7  CL 101 103 106 107 108  CO2 27 25 22 25 25   GLUCOSE 180* 115* 74 96 96  BUN 58* 49* 40* 36* 33*  CREATININE 0.85 0.91 0.73 0.89 0.85  CALCIUM 8.8* 8.8* 8.5* 8.3* 8.3*   Liver Function Tests: Recent Labs  Lab 04/23/19 0526  AST 11*    ALT 11  ALKPHOS 53  BILITOT 0.8  PROT 7.3  ALBUMIN 2.6*   No results for input(s): LIPASE, AMYLASE in the last 168 hours. No results for input(s): AMMONIA in the last 168 hours. CBC: Recent Labs  Lab 04/22/19 0435 04/23/19 0526 04/24/19 0502 04/25/19 0626 04/26/19 0731 04/27/19 0528 04/28/19 0445  WBC 16.6* 10.1 8.3 6.6 8.1 6.2 5.8  NEUTROABS 13.5* 8.6*  --   --   --   --   --   HGB 12.1 11.5* 11.3* 10.9* 11.8* 10.4* 10.1*  HCT 36.2 35.4* 35.7* 34.6* 38.6 33.6* 32.5*  MCV 78.0* 79.2* 81.9 82.8 83.7 84.0 83.3  PLT 246 213 219 206 242 180 170   Cardiac Enzymes: No results for input(s): CKTOTAL, CKMB, CKMBINDEX, TROPONINI in the last 168 hours. BNP (last 3 results) No results for input(s): PROBNP in the last 8760 hours. CBG: Recent Labs  Lab 04/27/19 1826 04/27/19 2135 04/28/19 0758 04/28/19 1130 04/28/19 1632  GLUCAP 212* 216* 86 114* 236*    Recent Results (from the past 240 hour(s))  Aspergillus Ag, BAL/Serum     Status: None   Collection Time: 04/20/19  5:18 AM   Specimen: Vein  Result Value Ref Range Status   Aspergillus Ag, BAL/Serum 0.09 0.00 - 0.49 Index Final    Comment: (NOTE) Performed At: [facility] [address] [place], [place] [id number] MD Jolene Schimke Performed At: [facility] [address] [place], [place] [id number] MDPhD Maurine Simmering          Studies: No results found.      Scheduled Meds: . ARIPiprazole  5 mg Oral Daily  . vitamin C  500 mg Oral Daily  . atorvastatin  80 mg Oral q1800  . busPIRone  10 mg Oral BID  . calcium-vitamin D  1 tablet Oral Daily  . Chlorhexidine Gluconate Cloth  6 each Topical Daily  . cholecalciferol  1,000 Units Oral Q breakfast  . ciclesonide  4 puff Inhalation BID  . darifenacin  7.5 mg Oral Daily  . dexamethasone  3 mg Oral Daily  . enoxaparin (LOVENOX) injection  40 mg Subcutaneous Q12H  . famotidine  40 mg Oral Daily  . FLUoxetine  20 mg Oral  Daily  . folic acid  1 mg Oral Daily  . insulin aspart  0-20 Units Subcutaneous TID WC  . insulin aspart  0-5 Units Subcutaneous QHS  . insulin aspart  6 Units Subcutaneous TID WC  . insulin glargine  50 Units Subcutaneous Daily  . levothyroxine  88 mcg Oral QAC breakfast  . linagliptin  5 mg Oral Daily  . mouth rinse  15 mL Mouth Rinse BID  . pramipexole  0.125 mg Oral BID  . pregabalin  150 mg Oral TID  . Ensure Max Protein  11 oz Oral BID BM  . QUEtiapine  50 mg Oral QHS  . sodium chloride flush  10-40 mL Intracatheter Q12H  . zinc  sulfate  220 mg Oral Daily  . zolpidem  5 mg Oral QHS   Continuous Infusions:  Principal Problem:   Acute respiratory disease due to COVID-19 virus Active Problems:   Chronic diastolic CHF (congestive heart failure) (HCC)   Rheumatoid arthritis   Type II diabetes mellitus with renal manifestations (HCC)   Sepsis (HCC)   CKD (chronic kidney disease), stage III   HLD (hyperlipidemia)    Time spent: [place] Derek Jack, MD, FACP, [facility]  If 7PM-7AM, please contact night-coverage [email or web address] Password [email or web address] 04/28/2019, 6:21 PM    LOS: 22 days

## 2019-04-28 NOTE — Progress Notes (Signed)
Transferred from ICU. Hiflo 02 continued with 100% pulse ox. States she is breathing better Mother in. Denies issues/concerns.

## 2019-04-29 LAB — BASIC METABOLIC PANEL
Anion gap: 9 (ref 5–15)
BUN: 28 mg/dL — ABNORMAL HIGH (ref 6–20)
CO2: 25 mmol/L (ref 22–32)
Calcium: 8.5 mg/dL — ABNORMAL LOW (ref 8.9–10.3)
Chloride: 107 mmol/L (ref 98–111)
Creatinine, Ser: 0.6 mg/dL (ref 0.44–1.00)
GFR calc Af Amer: >60 mL/min (ref >60)
GFR calc non Af Amer: >60 mL/min (ref >60)
Glucose, Bld: 137 mg/dL — ABNORMAL HIGH (ref 70–99)
Potassium: 3.8 mmol/L (ref 3.5–5.1)
Sodium: 141 mmol/L (ref 135–145)

## 2019-04-29 LAB — PROCALCITONIN: Procalcitonin: <0.1 ng/mL

## 2019-04-29 LAB — CBC
HCT: 33.1 % — ABNORMAL LOW (ref 36.0–46.0)
Hemoglobin: 10.2 g/dL — ABNORMAL LOW (ref 12.0–15.0)
MCH: 26 pg (ref 26.0–34.0)
MCHC: 30.8 g/dL (ref 30.0–36.0)
MCV: 84.4 fL (ref 80.0–100.0)
Platelets: 160 10*3/uL (ref 150–400)
RBC: 3.92 MIL/uL (ref 3.87–5.11)
RDW: 17.4 % — ABNORMAL HIGH (ref 11.5–15.5)
WBC: 5.2 10*3/uL (ref 4.0–10.5)
nRBC: 0 % (ref 0.0–0.2)

## 2019-04-29 LAB — GLUCOSE, CAPILLARY
Glucose-Capillary: 100 mg/dL — ABNORMAL HIGH (ref 70–99)
Glucose-Capillary: 157 mg/dL — ABNORMAL HIGH (ref 70–99)
Glucose-Capillary: 286 mg/dL — ABNORMAL HIGH (ref 70–99)
Glucose-Capillary: 268 mg/dL — ABNORMAL HIGH (ref 70–99)

## 2019-04-29 LAB — C-REACTIVE PROTEIN: CRP: 0.8 mg/dL (ref <1.0)

## 2019-04-29 LAB — BRAIN NATRIURETIC PEPTIDE: B Natriuretic Peptide: 39 pg/mL (ref 0.0–100.0)

## 2019-04-29 MED ORDER — FUROSEMIDE 10 MG/ML IJ SOLN
INTRAMUSCULAR | Status: AC
  Filled 2019-04-29: qty 4

## 2019-04-29 MED ORDER — IPRATROPIUM-ALBUTEROL 0.5-2.5 (3) MG/3ML IN SOLN: 3.0000 mL | Freq: Four times a day (QID) | RESPIRATORY_TRACT | Status: DC

## 2019-04-29 MED ORDER — IPRATROPIUM-ALBUTEROL 0.5-2.5 (3) MG/3ML IN SOLN
3.0000 mL | Freq: Four times a day (QID) | RESPIRATORY_TRACT | Status: DC
  Administered 2019-04-29 (×2): 3 mL via RESPIRATORY_TRACT
  Filled 2019-04-29 (×2): qty 3

## 2019-04-29 MED ORDER — SENNOSIDES-DOCUSATE SODIUM 8.6-50 MG PO TABS: 2.0000 | ORAL_TABLET | Freq: Every evening | ORAL | Status: DC | PRN

## 2019-04-29 MED ORDER — IPRATROPIUM-ALBUTEROL 0.5-2.5 (3) MG/3ML IN SOLN
3.0000 mL | Freq: Once | RESPIRATORY_TRACT | Status: AC
  Administered 2019-04-29: 09:00:00 3 mL via RESPIRATORY_TRACT

## 2019-04-29 NOTE — Progress Notes (Signed)
PROGRESS NOTE    [name] [name]  GEX:[id number] DOB: 29-Oct-1960 DOA: 04/06/2019 PCP: [facility]   Brief Narrative:  59 year old with history of HTN, HLD, DM 2, GERD, hypothyroidism, depression, anxiety, OA, C. difficile colitis, OSA, CKD stage III presented to ER on 12/18 with shortness of breath.  Diagnosed of Covid from 12/8-12/12 treated with remdesivir and steroids.  Found to be in ARDS requiring high amounts of oxygen, self proning, aggressive pulmonary toilet.  Has been extremely slow to improve.   Assessment & Plan:   Principal Problem:   Acute respiratory disease due to COVID-19 virus Active Problems:   Chronic diastolic CHF (congestive heart failure) (HCC)   Rheumatoid arthritis   Type II diabetes mellitus with renal manifestations (HCC)   Sepsis (HCC)   CKD (chronic kidney disease), stage III   HLD (hyperlipidemia)  Acute hypoxic respiratory failure secondary to ARDS with recent COVID-19 infection -Aggressive bronchodilators-Alvesco, incentive spirometer, flutter valve -Discontinue albuterol as needed, add Combivent as needed -Aggressive pulmonary toilet -Supplemental oxygen as needed -BNP and procalcitonin-normal  Generalized weakness -Secondary to recent critical illness and deconditioning -PT/OT-home health -Check TSH, vitamin D  Diabetes mellitus type 2 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 She is quite deconditioned  Review of Systems Otherwise negative except as per HPI, including: General: Denies fever, chills, night sweats or unintended weight loss. Resp: Denies cough, wheezing Cardiac: Denies chest pain, palpitations, orthopnea, paroxysmal nocturnal dyspnea. GI: Denies abdominal pain, nausea, vomiting, diarrhea or constipation GU: Denies dysuria, frequency, hesitancy or incontinence MS: Denies muscle aches, joint pain or swelling Neuro: Denies headache, neurologic deficits (focal weakness, numbness, tingling), abnormal gait Psych: Denies anxiety, depression, SI/HI/AVH Skin: Denies new rashes or lesions ID: Denies sick contacts, exotic exposures, travel  Objective: Vitals:   04/28/19 0817 04/28/19 0914 04/28/19 1514 04/29/19 0011  BP:  102/63 111/64 114/70  Pulse:  80 73 62  Resp:  18 20 18   Temp:  98.1 F (36.7 C) 97.9 F (36.6 C) 97.8 F (36.6 C)  TempSrc:  Oral Oral Oral  SpO2: 94% 97% 100% 99%  Weight:      Height:        Intake/Output Summary (Last 24 hours) at 04/29/2019 0739 Last data filed at 04/29/2019 0559 Gross per 24 hour  Intake --  Output 1400 ml  Net -1400 ml   Filed Weights   04/06/19 1000  Weight: (!) 145.2 kg    Examination:  General exam: Appears calm and comfortable, 10 L high flow Respiratory system: Bilateral rhonchi Cardiovascular system: S1 & S2 heard, RRR. No JVD, murmurs, rubs, gallops or clicks. No pedal edema. Gastrointestinal system: Abdomen is nondistended, soft and nontender. No organomegaly or masses felt. Normal bowel sounds heard. Central nervous system: Alert and oriented. No focal neurological deficits. Extremities: Symmetric 5 x 5 power. Skin: No rashes, lesions or ulcers Psychiatry: Judgement and insight appear normal. Mood & affect appropriate.   Data Reviewed:   CBC: Recent Labs  Lab 04/23/19 0526 04/25/19 0626 04/26/19 0731 04/27/19 0528 04/28/19 0445  04/29/19 0559  WBC 10.1 6.6 8.1 6.2 5.8 5.2  NEUTROABS 8.6*  --   --   --   --   --   HGB 11.5* 10.9* 11.8* 10.4* 10.1* 10.2*  HCT 35.4* 34.6* 38.6 33.6* 32.5* 33.1*  MCV 79.2* 82.8 83.7 84.0 83.3 84.4  PLT 213 206 242 180 170 160   Basic Metabolic Panel: Recent Labs  Lab 04/25/19 0626 04/26/19 0412 04/27/19 0528 04/28/19 0445 04/29/19 0559  NA 136 136 140 137 141  K 4.0 4.2 3.9 3.7 3.8  CL 103 106 107 108 107  CO2 25 22 25 25 25   GLUCOSE 115* 74 96 96 137*  BUN 49* 40* 36* 33* 28*  CREATININE 0.91 0.73 0.89 0.85 0.60  CALCIUM 8.8* 8.5* 8.3* 8.3* 8.5*   GFR: Estimated Creatinine Clearance: 119.8 mL/min (by C-G formula based on SCr of 0.6 mg/dL). Liver Function Tests: Recent Labs  Lab 04/23/19 0526  AST 11*  ALT 11  ALKPHOS 53  BILITOT 0.8  PROT 7.3  ALBUMIN 2.6*   No results for input(s): LIPASE, AMYLASE in the last 168 hours. No results for input(s): AMMONIA in the last 168 hours. Coagulation Profile: No results for input(s): INR, PROTIME in the last 168 hours. Cardiac Enzymes: No results for input(s): CKTOTAL, CKMB, CKMBINDEX, TROPONINI in the last 168 hours. BNP (last 3 results) No results for input(s): PROBNP in the last 8760 hours. HbA1C: No results for input(s): HGBA1C in the last 72 hours. CBG: Recent Labs  Lab 04/27/19 2135 04/28/19 0758 04/28/19 1130 04/28/19 1632 04/28/19 2121  GLUCAP 216* 86 114* 236* 246*   Lipid Profile: No results for input(s): CHOL, HDL, LDLCALC, TRIG, CHOLHDL, LDLDIRECT in the last 72 hours. Thyroid Function Tests: No results for input(s): TSH, T4TOTAL, FREET4, T3FREE, THYROIDAB in the last 72 hours. Anemia Panel: No results for input(s): VITAMINB12, FOLATE, FERRITIN, TIBC, IRON, RETICCTPCT in the last 72 hours. Sepsis Labs: No results for input(s): PROCALCITON, LATICACIDVEN in the last 168 hours.  Recent Results (from the past 240 hour(s))  Aspergillus Ag, BAL/Serum     Status: None   Collection Time: 04/20/19   5:18 AM   Specimen: Vein  Result Value Ref Range Status   Aspergillus Ag, BAL/Serum 0.09 0.00 - 0.49 Index Final    Comment: (NOTE) Performed At: [facility] [address] [place], [place] [id number] MD Jolene Schimke Performed At: [facility] [address] [place], [place] [id number] MDPhD Maurine Simmering          Radiology Studies: No results found.      Scheduled Meds: . ARIPiprazole  5 mg Oral Daily  . vitamin C  500 mg Oral Daily  . atorvastatin  80 mg Oral q1800  . busPIRone  10 mg Oral BID  . calcium-vitamin D  1 tablet Oral Daily  . Chlorhexidine Gluconate Cloth  6 each Topical Daily  . cholecalciferol  1,000 Units Oral Q breakfast  . ciclesonide  4 puff Inhalation BID  . darifenacin  7.5 mg Oral Daily  . dexamethasone  3 mg Oral Daily  . enoxaparin (LOVENOX) injection  40 mg Subcutaneous Q12H  . famotidine  40 mg Oral Daily  . FLUoxetine  20 mg Oral Daily  . folic acid  1 mg Oral Daily  . insulin aspart  0-20 Units Subcutaneous TID WC  . insulin aspart  0-5 Units Subcutaneous QHS  .  insulin aspart  6 Units Subcutaneous TID WC  . insulin glargine  50 Units Subcutaneous Daily  . levothyroxine  88 mcg Oral QAC breakfast  . linagliptin  5 mg Oral Daily  . mouth rinse  15 mL Mouth Rinse BID  . pramipexole  0.125 mg Oral BID  . pregabalin  150 mg Oral TID  . Ensure Max Protein  11 oz Oral BID BM  . QUEtiapine  50 mg Oral QHS  . sodium chloride flush  10-40 mL Intracatheter Q12H  . zinc sulfate  220 mg Oral Daily  . zolpidem  5 mg Oral QHS   Continuous Infusions:   LOS: 23 days   Time spent= 35 mins    [name] Cullin Arsenio Loader, MD [facility]  If 7PM-7AM, please contact night-coverage  04/29/2019, 7:39 AM

## 2019-04-29 NOTE — Plan of Care (Signed)
  Problem: Pain Managment: Goal: General experience of comfort will improve Outcome: Progressing   Problem: Skin Integrity: Goal: Risk for impaired skin integrity will decrease Outcome: Progressing  Problem: Respiratory: Goal: Will maintain a patent airway Outcome: Progressing   Patient complained of pain once due to coughing (chest), was given prn pain medication and prn cough medication once. Patient has Left gluteal wound with dressing on. Continue to have patient turn q2h. Patient maintained patent airway no complaints of SOB during shift. Simeon Craft, RN 04/29/2019 @ [phone]

## 2019-04-29 NOTE — Progress Notes (Signed)
Physical Therapy Treatment Patient Details Name: [name] [name] MRN: [id number] DOB: February 03, 1961 Today's Date: 04/29/2019    History of Present Illness presented to ER secondary to SOB; admitted for management of acute respiratory disease and sepsis secondary to COVID-19 virus.    PT Comments    Pt requested and received cough medicine prior to session.  5 lpm high flow upon arrival sats mid 90's at rest.  Pt ready to try mobility.  She is able to get to EOB with supervision.  Sats noted to decrease to low 80's with poor recovery.  Increased HFNC to 11 lpm.  Sats remained variable low to mid 80's.  Initially pt wanted to try to get to recliner but was limited by O2 sats.  Time given to rest and focus on breathing.  After 7 minutes she seemed to be having more difficulty dropping into the high 70's.  She was asked if she wanted to lay back down several times but stated "I just want to sit here for a few minutes."  She returned to supine with supervision.  She required extended time to rest - remaining on 11 lpm HFNC.  After about 15 minutes she was able to maintain 88/89% sats.  Discussed with respiratory therapist who stopped in and with charge RN as her primary RN was dealing with medical concerns with another patient.  She remained on 11 lpm upon leaving room and seemed generally comfortable and calmer.  She did become labile once returned to supine and voiced frustration over respiratory status.  Encouragement provided.   Follow Up Recommendations  Home health PT     Equipment Recommendations  None recommended by PT    Recommendations for Other Services       Precautions / Restrictions Precautions Precautions: Fall Precaution Comments: Watch HR/O2 with activity Restrictions Weight Bearing Restrictions: No    Mobility  Bed Mobility Overal bed mobility: Needs Assistance Bed Mobility: Supine to Sit;Sit to Supine     Supine to sit: Supervision;HOB elevated Sit to supine:  Supervision;HOB elevated      Transfers                 General transfer comment: deferred due to O2 sats, coughing and overall tolerance.  Ambulation/Gait             General Gait Details: deferred   Stairs             Wheelchair Mobility    Modified Rankin (Stroke Patients Only)       Balance Overall balance assessment: Needs assistance Sitting-balance support: Feet supported;No upper extremity supported Sitting balance-Leahy Scale: Good Sitting balance - Comments: Pt steady static sitting, reaching within BOS.   Standing balance support: Bilateral upper extremity supported   Standing balance comment: deferred                            Cognition Arousal/Alertness: Awake/alert Behavior During Therapy: WFL for tasks assessed/performed Overall Cognitive Status: Within Functional Limits for tasks assessed                                        Exercises Other Exercises Other Exercises: sitting EOB only x 7 minutes.  unable to tolerate exercises due to respiratory status and unable to maintain sats at rest.    General Comments  Pertinent Vitals/Pain Pain Assessment: No/denies pain    Home Living                      Prior Function            PT Goals (current goals can now be found in the care plan section) Progress towards PT goals: Not progressing toward goals - comment    Frequency    Min 2X/week      PT Plan Current plan remains appropriate    Co-evaluation              AM-PAC PT "6 Clicks" Mobility   Outcome Measure    Help needed moving from lying on your back to sitting on the side of a flat bed without using bedrails?: None Help needed moving to and from a bed to a chair (including a wheelchair)?: A Little Help needed standing up from a chair using your arms (e.g., wheelchair or bedside chair)?: A Little Help needed to walk in hospital room?: A Little Help needed  climbing 3-5 steps with a railing? : A Little 6 Click Score: 16    End of Session Equipment Utilized During Treatment: Oxygen Activity Tolerance: Treatment limited secondary to medical complications (Comment) Patient left: in bed;with call bell/phone within reach;with bed alarm set Nurse Communication: Other (comment)       Time: [id number] PT Time Calculation (min) (ACUTE ONLY): 31 min  Charges:  $Therapeutic Activity: 23-37 mins                    Danielle Dess, PTA 04/29/19, 11:39 AM

## 2019-04-30 LAB — CBC
HCT: 33.9 % — ABNORMAL LOW (ref 36.0–46.0)
Hemoglobin: 10.2 g/dL — ABNORMAL LOW (ref 12.0–15.0)
MCH: 25.8 pg — ABNORMAL LOW (ref 26.0–34.0)
MCHC: 30.1 g/dL (ref 30.0–36.0)
MCV: 85.8 fL (ref 80.0–100.0)
Platelets: 159 10*3/uL (ref 150–400)
RBC: 3.95 MIL/uL (ref 3.87–5.11)
RDW: 17.6 % — ABNORMAL HIGH (ref 11.5–15.5)
WBC: 5.6 10*3/uL (ref 4.0–10.5)
nRBC: 0 % (ref 0.0–0.2)

## 2019-04-30 LAB — COMPREHENSIVE METABOLIC PANEL
ALT: 17 U/L (ref 0–44)
AST: 12 U/L — ABNORMAL LOW (ref 15–41)
Albumin: 2.5 g/dL — ABNORMAL LOW (ref 3.5–5.0)
Alkaline Phosphatase: 42 U/L (ref 38–126)
Anion gap: 6 (ref 5–15)
BUN: 26 mg/dL — ABNORMAL HIGH (ref 6–20)
CO2: 25 mmol/L (ref 22–32)
Calcium: 8.4 mg/dL — ABNORMAL LOW (ref 8.9–10.3)
Chloride: 109 mmol/L (ref 98–111)
Creatinine, Ser: 0.7 mg/dL (ref 0.44–1.00)
GFR calc Af Amer: >60 mL/min (ref >60)
GFR calc non Af Amer: >60 mL/min (ref >60)
Glucose, Bld: 142 mg/dL — ABNORMAL HIGH (ref 70–99)
Potassium: 3.2 mmol/L — ABNORMAL LOW (ref 3.5–5.1)
Sodium: 140 mmol/L (ref 135–145)
Total Bilirubin: 0.5 mg/dL (ref 0.3–1.2)
Total Protein: 6.5 g/dL (ref 6.5–8.1)

## 2019-04-30 LAB — GLUCOSE, CAPILLARY
Glucose-Capillary: 142 mg/dL — ABNORMAL HIGH (ref 70–99)
Glucose-Capillary: 194 mg/dL — ABNORMAL HIGH (ref 70–99)
Glucose-Capillary: 261 mg/dL — ABNORMAL HIGH (ref 70–99)
Glucose-Capillary: 315 mg/dL — ABNORMAL HIGH (ref 70–99)

## 2019-04-30 LAB — VITAMIN D 25 HYDROXY (VIT D DEFICIENCY, FRACTURES): Vit D, 25-Hydroxy: 37.36 ng/mL (ref 30–100)

## 2019-04-30 LAB — MAGNESIUM: Magnesium: 1.7 mg/dL (ref 1.7–2.4)

## 2019-04-30 LAB — TSH: TSH: 1.16 u[IU]/mL (ref 0.350–4.500)

## 2019-04-30 MED ORDER — POTASSIUM CHLORIDE CRYS ER 20 MEQ PO TBCR
40.0000 meq | EXTENDED_RELEASE_TABLET | Freq: Once | ORAL | Status: AC
  Administered 2019-04-30: 40 meq via ORAL
  Filled 2019-04-30: qty 2

## 2019-04-30 MED ORDER — IPRATROPIUM-ALBUTEROL 0.5-2.5 (3) MG/3ML IN SOLN
3.0000 mL | Freq: Three times a day (TID) | RESPIRATORY_TRACT | Status: DC
  Administered 2019-04-30 – 2019-05-04 (×13): 3 mL via RESPIRATORY_TRACT
  Filled 2019-04-30 (×14): qty 3

## 2019-04-30 MED ORDER — IPRATROPIUM-ALBUTEROL 0.5-2.5 (3) MG/3ML IN SOLN: 3.0000 mL | Freq: Four times a day (QID) | RESPIRATORY_TRACT | Status: DC | PRN

## 2019-04-30 MED ORDER — INSULIN GLARGINE 100 UNIT/ML ~~LOC~~ SOLN
55.0000 [IU] | Freq: Every day | SUBCUTANEOUS | Status: DC
  Administered 2019-05-01 – 2019-05-04 (×3): 55 [IU] via SUBCUTANEOUS
  Filled 2019-04-30 (×5): qty 0.55

## 2019-04-30 MED ORDER — MAGNESIUM SULFATE 2 GM/50ML IV SOLN
2.0000 g | Freq: Once | INTRAVENOUS | Status: AC
  Administered 2019-04-30: 2 g via INTRAVENOUS
  Filled 2019-04-30: qty 50

## 2019-04-30 NOTE — Progress Notes (Signed)
PROGRESS NOTE    [name] [name]  MWU:[id number] DOB: 09-19-1960 DOA: 04/06/2019 PCP: [facility]   Brief Narrative:  59 year old with history of HTN, HLD, DM 2, GERD, hypothyroidism, depression, anxiety, OA, C. difficile colitis, OSA, CKD stage III presented to ER on 12/18 with shortness of breath.  Diagnosed of Covid from 12/8-12/12 treated with remdesivir and steroids.  Found to be in ARDS requiring high amounts of oxygen, self proning, aggressive pulmonary toilet.  Has been extremely slow to improve.   Assessment & Plan:   Principal Problem:   Acute respiratory disease due to COVID-19 virus Active Problems:   Chronic diastolic CHF (congestive heart failure) (HCC)   Rheumatoid arthritis   Type II diabetes mellitus with renal manifestations (HCC)   Sepsis (HCC)   CKD (chronic kidney disease), stage III   HLD (hyperlipidemia)  Acute hypoxic respiratory failure secondary to ARDS with recent COVID-19 infection -Patient is still on 6 L of nasal cannula.  No home oxygen use prior to hospital arrival.  Aggressive bronchodilators-Alvesco, incentive spirometer, flutter valve -Discontinue albuterol as needed, add Combivent as needed -Aggressive pulmonary toilet -Supplemental oxygen as needed -BNP and procalcitonin-normal I believe use of oxygen will be very chronic therefore we will have to make home health and home O2 arrangements.  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  -NovoLog 6 units Premeal, sliding scale -Lyrica  Hypothyroidism -Synthroid  Hyperlipidemia -Lipitor 80 mg  Anxiety/depression -Abilify, BuSpar, Seroquel  Rheumatoid arthritis -On Enablex   DVT prophylaxis: Lovenox Code Status: Full Family Communication: None at bedside  Disposition Plan: Maintain  hospital stay, still on high O2 (6L) no home O2 prior to admission.   Consultants:   Pulmonary  Procedures:   Pulmonary  Antimicrobials:   None   Subjective: Feels ok, still very weak. Difficult for her to work with PT without getting SOB  Review of Systems Otherwise negative except as per HPI, including: General = no fevers, chills, dizziness, malaise, fatigue HEENT/EYES = negative for pain, redness, loss of vision, double vision, blurred vision, loss of hearing, sore throat, hoarseness, dysphagia Cardiovascular= negative for chest pain, palpitation, murmurs, lower extremity swelling Respiratory/lungs= negative forhemoptysis, wheezing, mucus production Gastrointestinal= negative for nausea, vomiting,, abdominal pain, melena, hematemesis Genitourinary= negative for Dysuria, Hematuria, Change in Urinary Frequency MSK = Negative for arthralgia, myalgias, Back Pain, Joint swelling  Neurology= Negative for headache, seizures, numbness, tingling  Psychiatry= Negative for anxiety, depression, suicidal and homocidal ideation Allergy/Immunology= Medication/Food allergy as listed  Skin= Negative for Rash, lesions, ulcers, itching  Objective: Vitals:   04/29/19 2022 04/29/19 2347 04/30/19 0732 04/30/19 0849  BP:  117/64 109/66   Pulse:  77 63   Resp:  18    Temp:  98 F (36.7 C) 98 F (36.7 C)   TempSrc:  Oral Oral   SpO2: 96% 98% 98% 94%  Weight:      Height:        Intake/Output Summary (Last 24 hours) at 04/30/2019 1101 Last data filed at 04/30/2019 0900 Gross per 24 hour  Intake 697 ml  Output 200 ml  Net 497 ml   Filed Weights   04/06/19 1000  Weight: (!) 145.2 kg    Examination:  Constitutional: Not in acute distress; on 6L [name].  Respiratory: Diffuse Rhonchi.  Cardiovascular: Normal sinus rhythm, no rubs Abdomen: Nontender nondistended  good bowel sounds Musculoskeletal: No edema noted Skin: No rashes seen Neurologic: CN 2-12 grossly intact.  And  nonfocal Psychiatric: Normal judgment and insight. Alert and oriented x 3. Normal mood.     Data Reviewed:   CBC: Recent Labs  Lab 04/26/19 0731 04/27/19 0528 04/28/19 0445 04/29/19 0559 04/30/19 0855  WBC 8.1 6.2 5.8 5.2 5.6  HGB 11.8* 10.4* 10.1* 10.2* 10.2*  HCT 38.6 33.6* 32.5* 33.1* 33.9*  MCV 83.7 84.0 83.3 84.4 85.8  PLT 242 180 170 160 159   Basic Metabolic Panel: Recent Labs  Lab 04/26/19 0412 04/27/19 0528 04/28/19 0445 04/29/19 0559 04/30/19 0855  NA 136 140 137 141 140  K 4.2 3.9 3.7 3.8 3.2*  CL 106 107 108 107 109  CO2 22 25 25 25 25   GLUCOSE 74 96 96 137* 142*  BUN 40* 36* 33* 28* 26*  CREATININE 0.73 0.89 0.85 0.60 0.70  CALCIUM 8.5* 8.3* 8.3* 8.5* 8.4*  MG  --   --   --   --  1.7   GFR: Estimated Creatinine Clearance: 119.8 mL/min (by C-G formula based on SCr of 0.7 mg/dL). Liver Function Tests: Recent Labs  Lab 04/30/19 0855  AST 12*  ALT 17  ALKPHOS 42  BILITOT 0.5  PROT 6.5  ALBUMIN 2.5*   No results for input(s): LIPASE, AMYLASE in the last 168 hours. No results for input(s): AMMONIA in the last 168 hours. Coagulation Profile: No results for input(s): INR, PROTIME in the last 168 hours. Cardiac Enzymes: No results for input(s): CKTOTAL, CKMB, CKMBINDEX, TROPONINI in the last 168 hours. BNP (last 3 results) No results for input(s): PROBNP in the last 8760 hours. HbA1C: No results for input(s): HGBA1C in the last 72 hours. CBG: Recent Labs  Lab 04/29/19 0802 04/29/19 1152 04/29/19 1659 04/29/19 2123 04/30/19 0734  GLUCAP 100* 157* 286* 268* 142*   Lipid Profile: No results for input(s): CHOL, HDL, LDLCALC, TRIG, CHOLHDL, LDLDIRECT in the last 72 hours. Thyroid Function Tests: Recent Labs    04/30/19 0855  TSH 1.160   Anemia Panel: No results for input(s): VITAMINB12, FOLATE, FERRITIN, TIBC, IRON, RETICCTPCT in the last 72 hours. Sepsis Labs: Recent Labs  Lab 04/29/19 0559  PROCALCITON <0.10    No results found  for this or any previous visit (from the past 240 hour(s)).       Radiology Studies: No results found.      Scheduled Meds: . ARIPiprazole  5 mg Oral Daily  . vitamin C  500 mg Oral Daily  . atorvastatin  80 mg Oral q1800  . busPIRone  10 mg Oral BID  . calcium-vitamin D  1 tablet Oral Daily  . Chlorhexidine Gluconate Cloth  6 each Topical Daily  . cholecalciferol  1,000 Units Oral Q breakfast  . ciclesonide  4 puff Inhalation BID  . darifenacin  7.5 mg Oral Daily  . dexamethasone  3 mg Oral Daily  . enoxaparin (LOVENOX) injection  40 mg Subcutaneous Q12H  . famotidine  40 mg Oral Daily  . FLUoxetine  20 mg Oral Daily  . folic acid  1 mg Oral Daily  . insulin aspart  0-20 Units Subcutaneous TID WC  . insulin aspart  0-5 Units Subcutaneous QHS  . insulin aspart  6 Units Subcutaneous TID WC  . insulin glargine  50 Units Subcutaneous Daily  . ipratropium-albuterol  3 mL Nebulization TID  . levothyroxine  88 mcg Oral QAC breakfast  . linagliptin  5 mg  Oral Daily  . mouth rinse  15 mL Mouth Rinse BID  . pramipexole  0.125 mg Oral BID  . pregabalin  150 mg Oral TID  . Ensure Max Protein  11 oz Oral BID BM  . QUEtiapine  50 mg Oral QHS  . sodium chloride flush  10-40 mL Intracatheter Q12H  . zinc sulfate  220 mg Oral Daily  . zolpidem  5 mg Oral QHS   Continuous Infusions:   LOS: 24 days   Time spent= 35 mins    [name] Lotter Joline Maxcy, MD [facility]  If 7PM-7AM, please contact night-coverage  04/30/2019, 11:01 AM

## 2019-04-30 NOTE — Progress Notes (Signed)
Nutrition Follow Up Note   DOCUMENTATION CODES:   Morbid obesity  INTERVENTION:   Ensure Max protein supplement BID, each supplement provides 150kcal and 30g of protein.  Weekly weights  NUTRITION DIAGNOSIS:   Increased nutrient needs related to catabolic illness(COVID 19) as evidenced by increased estimated needs.  GOAL:   Patient will meet greater than or equal to 90% of their needs  -progressing   MONITOR:   PO intake, Supplement acceptance, Labs, Weight trends, Skin, I & O's  ASSESSMENT:   59 y.o. female former smoker with a history of diabetes mellitus, morbid obesity, CHF, depression, IBS and rheumatoid arthritis diagnosed with COVID-19 on 12/4 was hospitalized at [facility] and discharged, now admitted with PNA and ARDS   Pt with improved appetite and oral intake; pt eating 100% of meals and drinking some protein supplements. RD reiterated today the importance of adequate nutrition needed to preserve lean muscle; pt agrees to keep drinking her supplements. No new weight since admit; will re-request weekly weights.    Medications reviewed and include: vitamin C, oscal w/ D, D3, dexamethasone, lovenox, pepcid, folic acid, insulin, synthroid, zinc  Labs reviewed: K 3.2(L), BUN 26(H), Mg 1.7 wnl cbgs- 157, 286, 268, 142, 194 x 24 hrs  Diet Order:   Diet Order            Diet Carb Modified Fluid consistency: Thin; Room service appropriate? Yes  Diet effective now             EDUCATION NEEDS:   No education needs have been identified at this time  Skin:  Skin Assessment: Reviewed RN Assessment(ecchymosis, MASD, wound buttocks)  Last BM:  1/11- type 6  Height:   Ht Readings from Last 1 Encounters:  04/10/19 5\' 9"  (1.753 m)    Weight:   Wt Readings from Last 1 Encounters:  04/06/19 (!) 145.2 kg    Ideal Body Weight:  65.9 kg  BMI:  Body mass index is 47.26 kg/m.  Estimated Nutritional Needs:   Kcal:  2700-3000kcal/day  Protein:   >135g/day  Fluid:  >2L/day  04/08/19 MS, RD, LDN Pager #- [phone] Office#- [phone] After Hours Pager: [phone]

## 2019-04-30 NOTE — Plan of Care (Signed)
  Problem: Education: Goal: Knowledge of General Education information will improve Description: Including pain rating scale, medication(s)/side effects and non-pharmacologic comfort measures Outcome: Progressing   Problem: Health Behavior/Discharge Planning: Goal: Ability to manage health-related needs will improve Outcome: Progressing   Problem: Clinical Measurements: Goal: Ability to maintain clinical measurements within normal limits will improve Outcome: Progressing Goal: Will remain free from infection Outcome: Progressing Goal: Diagnostic test results will improve Outcome: Progressing Goal: Respiratory complications will improve Outcome: Progressing Goal: Cardiovascular complication will be avoided Outcome: Progressing   Problem: Elimination: Goal: Will not experience complications related to bowel motility Outcome: Progressing Goal: Will not experience complications related to urinary retention Outcome: Progressing   Problem: Coping: Goal: Level of anxiety will decrease Outcome: Progressing   

## 2019-05-01 LAB — COMPREHENSIVE METABOLIC PANEL
ALT: 15 U/L (ref 0–44)
AST: 11 U/L — ABNORMAL LOW (ref 15–41)
Albumin: 2.3 g/dL — ABNORMAL LOW (ref 3.5–5.0)
Alkaline Phosphatase: 36 U/L — ABNORMAL LOW (ref 38–126)
Anion gap: 6 (ref 5–15)
BUN: 24 mg/dL — ABNORMAL HIGH (ref 6–20)
CO2: 25 mmol/L (ref 22–32)
Calcium: 8.6 mg/dL — ABNORMAL LOW (ref 8.9–10.3)
Chloride: 113 mmol/L — ABNORMAL HIGH (ref 98–111)
Creatinine, Ser: 0.64 mg/dL (ref 0.44–1.00)
GFR calc Af Amer: >60 mL/min (ref >60)
GFR calc non Af Amer: >60 mL/min (ref >60)
Glucose, Bld: 143 mg/dL — ABNORMAL HIGH (ref 70–99)
Potassium: 3.9 mmol/L (ref 3.5–5.1)
Sodium: 144 mmol/L (ref 135–145)
Total Bilirubin: 0.4 mg/dL (ref 0.3–1.2)
Total Protein: 5.9 g/dL — ABNORMAL LOW (ref 6.5–8.1)

## 2019-05-01 LAB — CBC
HCT: 30.5 % — ABNORMAL LOW (ref 36.0–46.0)
Hemoglobin: 9.2 g/dL — ABNORMAL LOW (ref 12.0–15.0)
MCH: 25.8 pg — ABNORMAL LOW (ref 26.0–34.0)
MCHC: 30.2 g/dL (ref 30.0–36.0)
MCV: 85.7 fL (ref 80.0–100.0)
Platelets: 151 10*3/uL (ref 150–400)
RBC: 3.56 MIL/uL — ABNORMAL LOW (ref 3.87–5.11)
RDW: 17.7 % — ABNORMAL HIGH (ref 11.5–15.5)
WBC: 4.8 10*3/uL (ref 4.0–10.5)
nRBC: 0.4 % — ABNORMAL HIGH (ref 0.0–0.2)

## 2019-05-01 LAB — GLUCOSE, CAPILLARY
Glucose-Capillary: 93 mg/dL (ref 70–99)
Glucose-Capillary: 116 mg/dL — ABNORMAL HIGH (ref 70–99)
Glucose-Capillary: 219 mg/dL — ABNORMAL HIGH (ref 70–99)
Glucose-Capillary: 220 mg/dL — ABNORMAL HIGH (ref 70–99)

## 2019-05-01 LAB — MAGNESIUM: Magnesium: 2.1 mg/dL (ref 1.7–2.4)

## 2019-05-01 NOTE — Progress Notes (Signed)
Physical Therapy Treatment Patient Details Name: [name] [name] MRN: [id number] DOB: 09/18/1960 Today's Date: 05/01/2019    History of Present Illness presented to ER secondary to SOB; admitted for management of acute respiratory disease and sepsis secondary to COVID-19 virus.    PT Comments    Pt feeling better, ready to try therapy this am.  Sats 92% on 7 lpm HFNC.  To edge of bed with rail and supervision.  Sats decreased to 82% quickly with no sign of increasing after a few minutes.  O2 was increased to 11 LPM and sats increased to 88-90%.  She was able to stand with min a x 1 to RW and transfer to recliner at bedside with steady steps.  Sats min 80's at rest and increased back to baseline.  O2 was turned down to 7 lpm and she was able to maintain sats in sitting.  Participated in exercises as described below.   Overall improved tolerance for activity today with improved sats.  Further mobility was deferred as she has not been OOB in several days and wants to be able to sit up for a while today.  Education provided not to sit too long in chair where she is too fatigued later.  She voiced understanding.  Discussed discharge plan.  She seems to have a good equipment supply at home but would benefit from a wheelchair at home as she is unable to walk household distances at this time.  Discussed home and she does have a ramp but has a small step up into her house that would be hard to get a wheelchair up into.  Her mom plans to take her home.  Discussed options and she is considering ambulance transfer home at this time if mobility does not improve before discharge.     Patient suffers from ARDS secondary to [place] which impairs his/her ability to perform daily activities like toileting, feeding, dressing, grooming, bathing in the home. A cane, walker, crutch will not resolve the patient's issue with performing activities of daily living. A lightweight wheelchair is required/recommended and will  allow patient to safely perform daily activities.   Patient can safely propel the wheelchair in the home or has a caregiver who can provide assistance.   Discussed with RN and tech increasing O2 for mobility as needed to allow for activity.   Follow Up Recommendations  Home health PT     Equipment Recommendations  None recommended by PT    Recommendations for Other Services       Precautions / Restrictions Precautions Precautions: Fall Precaution Comments: Watch HR/O2 with activity Restrictions Weight Bearing Restrictions: No    Mobility  Bed Mobility Overal bed mobility: Needs Assistance Bed Mobility: Supine to Sit     Supine to sit: Supervision;HOB elevated        Transfers Overall transfer level: Needs assistance Equipment used: Rolling walker (2 wheeled) Transfers: Sit to/from Stand Sit to Stand: Min guard            Ambulation/Gait Ambulation/Gait assistance: Min guard Gait Distance (Feet): 3 Feet Assistive device: Rolling walker (2 wheeled) Gait Pattern/deviations: Step-to pattern     General Gait Details: transfered to recliner but no true gait   Stairs             Wheelchair Mobility    Modified Rankin (Stroke Patients Only)       Balance Overall balance assessment: Needs assistance Sitting-balance support: Feet supported;No upper extremity supported Sitting balance-Leahy Scale: Good  Standing balance support: Bilateral upper extremity supported Standing balance-Leahy Scale: Fair                              Cognition Arousal/Alertness: Awake/alert Behavior During Therapy: WFL for tasks assessed/performed Overall Cognitive Status: Within Functional Limits for tasks assessed                                        Exercises Other Exercises Other Exercises: seated AROM for ankle pumps, LAQ, marches, ab/add x 10    General Comments        Pertinent Vitals/Pain Pain Assessment: No/denies  pain    Home Living                      Prior Function            PT Goals (current goals can now be found in the care plan section) Progress towards PT goals: Progressing toward goals    Frequency    Min 2X/week      PT Plan Current plan remains appropriate    Co-evaluation              AM-PAC PT "6 Clicks" Mobility   Outcome Measure  Help needed turning from your back to your side while in a flat bed without using bedrails?: None Help needed moving from lying on your back to sitting on the side of a flat bed without using bedrails?: None Help needed moving to and from a bed to a chair (including a wheelchair)?: A Little Help needed standing up from a chair using your arms (e.g., wheelchair or bedside chair)?: A Little Help needed to walk in hospital room?: A Little Help needed climbing 3-5 steps with a railing? : A Little 6 Click Score: 20    End of Session Equipment Utilized During Treatment: Oxygen;Gait belt Activity Tolerance: Patient tolerated treatment well Patient left: in chair;with call bell/phone within reach;with chair alarm set Nurse Communication: Mobility status;Other (comment)       Time: 2376-2831 PT Time Calculation (min) (ACUTE ONLY): 23 min  Charges:  $Therapeutic Exercise: 8-22 mins $Therapeutic Activity: 8-22 mins                     Danielle Dess, PTA 05/01/19, 12:23 PM

## 2019-05-01 NOTE — Progress Notes (Signed)
Occupational Therapy Treatment Patient Details Name: [name] [name] [name] MRN: [id number] DOB: 1960/12/06 Today's Date: 05/01/2019    History of present illness presented to ER secondary to SOB; admitted for management of acute respiratory disease and sepsis secondary to COVID-19 virus.   OT comments  [name] [name] [name] was seen for OT tx session [name] date. Pt received seated upright in room recliner on 7 L/m HFNC. Pt spO2 remains >90% t/o seated ADL tasks, but drops during functional mobility [name] date. Per plan with pt from previous OT session, pt and provider discuss AE for LB ADL management as it pertains to improving functional independence and maximizing energy conservation. [name] [name] [name] [name] demonstrates use of long handle reacher and sock aid for LB dressing tasks. Pt return demonstrates understanding of education provided with min VCs for tehcnique (see below for additional details). Pt requesting to return to bed at end of OT session. Completes functional mobility with CGA for safety and log roll bed mobility technique with supervision.Pt encouraged by functional mobility attempts [name] date and requesting to have BSC set up to minimize use of external catheter. [name] [name] [name] informed pt primary RN pt will need bari-BSC in room. Pt progressing toward OT goals and continues to benefit from skilled OT services to maximize return to PLOF and minimize risk of future falls, injury, caregiver burden, and readmission. Will continue to follow POC as written. Discharge recommendation remains appropriate.    Follow Up Recommendations  SNF    Equipment Recommendations  None recommended by OT    Recommendations for Other Services      Precautions / Restrictions Precautions Precautions: Fall Precaution Comments: Watch HR/O2 with activity Restrictions Weight Bearing Restrictions: No       Mobility Bed Mobility Overal bed mobility: Needs Assistance Bed Mobility: Sit to Supine       Sit to supine:  Supervision   General bed mobility comments: Pt return demos understanding of log-roll technique for sit>side>sup t/f [name] date.  Transfers Overall transfer level: Needs assistance Equipment used: Rolling walker (2 wheeled) Transfers: Sit to/from Stand Sit to Stand: Min guard         General transfer comment: Pt t/fs from recliner to EOB w/ min guard for safety. No LOB noted. Safe transfer technique.    Balance Overall balance assessment: Needs assistance Sitting-balance support: Feet supported;No upper extremity supported Sitting balance-Leahy Scale: Good Sitting balance - Comments: Pt steady static sitting, reaching within BOS. No LOB noted with weight shift during functional activity   Standing balance support: Bilateral upper extremity supported;During functional activity Standing balance-Leahy Scale: Good                             ADL either performed or assessed with clinical judgement   ADL Overall ADL's : Needs assistance/impaired                                       General ADL Comments: Pt continues to be limited by decreased activity tolerance and cardiopulmonary status during OT evaluation. Pt requires Minimal assist for heavy ADL tasks including bathing and dressing in a seated position. Requires frequent cues for breathing techniques [name] date. Pt able to use LHR to doff a sock on [name] date and return demos understanding of use of sock aid to don hospital sock again. Min Vcs for technique. No physical assist required.  Vision Baseline Vision/History: Wears glasses Wears Glasses: Reading only Patient Visual Report: No change from baseline     Perception     Praxis      Cognition Arousal/Alertness: Awake/alert Behavior During Therapy: WFL for tasks assessed/performed Overall Cognitive Status: Within Functional Limits for tasks assessed                                          Exercises Other  Exercises Other Exercises: Pt educated in safe use of AE for LB ADL Management with energy conservation education provided throughout. Pt return demonstrates use of AE to doff/don hospital sock with min VCs for technique and supervision for ssafety.   Shoulder Instructions       General Comments Pt on 7L HFNC at start of session spO2 remain WFLS with seated ADL tasks. Pt moved to 11 L/m HFNC for functional mobility. Pt noted to desat to 88% with mobility, but rebounds quickly with seated rest break and cues for PLB. Pt left on 7L HFNC with spO2 92% at last reading of session.    Pertinent Vitals/ Pain       Pain Assessment: No/denies pain  Home Living                                          Prior Functioning/Environment              Frequency  Min 1X/week        Progress Toward Goals  OT Goals(current goals can now be found in the care plan section)  Progress towards OT goals: Progressing toward goals  Acute Rehab OT Goals Patient Stated Goal: to get strength back OT Goal Formulation: With patient Time For Goal Achievement: 05/08/19 Potential to Achieve Goals: Good  Plan Discharge plan remains appropriate;Frequency remains appropriate    Co-evaluation                 AM-PAC OT "6 Clicks" Daily Activity     Outcome Measure   Help from another person eating meals?: None Help from another person taking care of personal grooming?: A Little Help from another person toileting, which includes using toliet, bedpan, or urinal?: A Little Help from another person bathing (including washing, rinsing, drying)?: A Lot Help from another person to put on and taking off regular upper body clothing?: A Little Help from another person to put on and taking off regular lower body clothing?: A Lot 6 Click Score: 17    End of Session Equipment Utilized During Treatment: Gait belt;Rolling walker  OT Visit Diagnosis: Other abnormalities of gait and mobility  (R26.89);Muscle weakness (generalized) (M62.81)   Activity Tolerance Patient tolerated treatment well   Patient Left in bed;with call bell/phone within reach;with bed alarm set   Nurse Communication Other (comment)(Pt needs bari [facility] in room)        Time: 6962-9528 OT Time Calculation (min): 26 min  Charges: OT General Charges $OT Visit: 1 Visit OT Treatments $Self Care/Home Management : 23-37 mins  Rockney Ghee, M.S., OTR/L Ascom: [phone] 05/01/19, 4:57 PM

## 2019-05-01 NOTE — Progress Notes (Signed)
PROGRESS NOTE    [name] [name]  CZY:[id number] DOB: 1960/10/09 DOA: 04/06/2019 PCP: [facility]   Brief Narrative:  59 year old with history of HTN, HLD, DM 2, GERD, hypothyroidism, depression, anxiety, OA, C. difficile colitis, OSA, CKD stage III presented to ER on 12/18 with shortness of breath.  Diagnosed of Covid from 12/8-12/12 treated with remdesivir and steroids.  Found to be in ARDS requiring high amounts of oxygen, self proning, aggressive pulmonary toilet.  Has been extremely slow to improve.  Currently working with physical therapy as tolerated and monitoring her oxygen level.  Once this is stable, she can be discharged home with oxygen.   Assessment & Plan:   Principal Problem:   Acute respiratory disease due to COVID-19 virus Active Problems:   Chronic diastolic CHF (congestive heart failure) (HCC)   Rheumatoid arthritis   Type II diabetes mellitus with renal manifestations (HCC)   Sepsis (HCC)   CKD (chronic kidney disease), stage III   HLD (hyperlipidemia)  Acute hypoxic respiratory failure secondary to ARDS with recent COVID-19 infection -Still fluctuating between 6-7 L nasal cannula at high flow.  Not on any home oxygen prior to arrival.  Aggressive bronchodilators-Alvesco, incentive spirometer, flutter valve -Discontinue albuterol as needed, Combivent as needed -Aggressive pulmonary toilet -Supplemental oxygen as needed -BNP and procalcitonin-normal Continue to work with physical therapy and monitor oxygen levels.  Will require home oxygen eventually.  Generalized weakness -Secondary to recent critical illness and deconditioning -PT/OT-home health (ordered).  Eventually likely will require 5 to 7 L nasal cannula at home. 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  -NovoLog 6 units Premeal, sliding scale -Lyrica  Hypothyroidism -Synthroid  Hyperlipidemia -Lipitor 80  mg  Anxiety/depression -Abilify, BuSpar, Seroquel  Rheumatoid arthritis -On Enablex   DVT prophylaxis: Lovenox Code Status: Full Family Communication: None at bedside  Disposition Plan: Maintain hospital stay, still on high O2 (6L) no home O2 prior to admission.  Once this stabilizes, she will be will be discharged home with home oxygen.  Currently monitoring her with therapy in the hospital as her oxygen levels are fluctuating.  Consultants:   Pulmonary  Procedures:   Pulmonary  Antimicrobials:   None   Subjective: Gets very short of breath when working with physical therapy.  At rest she feels okay otherwise overall weak.  Review of Systems Otherwise negative except as per HPI, including: General = no fevers, chills, dizziness, malaise, fatigue HEENT/EYES = negative for pain, redness, loss of vision, double vision, blurred vision, loss of hearing, sore throat, hoarseness, dysphagia Cardiovascular= negative for chest pain, palpitation, murmurs, lower extremity swelling Respiratory/lungs= negative for hemoptysis, wheezing, mucus production Gastrointestinal= negative for nausea, vomiting,, abdominal pain, melena, hematemesis Genitourinary= negative for Dysuria, Hematuria, Change in Urinary Frequency MSK = Negative for arthralgia, myalgias, Back Pain, Joint swelling  Neurology= Negative for headache, seizures, numbness, tingling  Psychiatry= Negative for anxiety, depression, suicidal and homocidal ideation Allergy/Immunology= Medication/Food allergy as listed  Skin= Negative for Rash, lesions, ulcers, itching   Objective: Vitals:   04/30/19 1600 04/30/19 1953 04/30/19 2308 05/01/19 0758  BP:   106/60 (!) 92/36  Pulse:   72 70  Resp:    18  Temp:   (!) 97.3 F (36.3 C) 97.8 F (36.6 C)  TempSrc:   Oral Oral  SpO2:  95% 97% 96%  Weight: 130 kg     Height:        Intake/Output  Summary (Last 24 hours) at 05/01/2019 1326 Last data filed at 05/01/2019 0900 Gross  per 24 hour  Intake 514.6 ml  Output 1200 ml  Net -685.4 ml   Filed Weights   04/06/19 1000 04/30/19 1600  Weight: (!) 145.2 kg 130 kg    Examination:  Constitutional: Not in acute distress, 6-7 L nasal cannula Respiratory: Bilateral diminished breath sounds Cardiovascular: Normal sinus rhythm, no rubs Abdomen: Nontender nondistended good bowel sounds Musculoskeletal: No edema noted Skin: No rashes seen Neurologic: CN 2-12 grossly intact.  And nonfocal Psychiatric: Normal judgment and insight. Alert and oriented x 3. Normal mood.   Data Reviewed:   CBC: Recent Labs  Lab 04/27/19 0528 04/28/19 0445 04/29/19 0559 04/30/19 0855 05/01/19 0431  WBC 6.2 5.8 5.2 5.6 4.8  HGB 10.4* 10.1* 10.2* 10.2* 9.2*  HCT 33.6* 32.5* 33.1* 33.9* 30.5*  MCV 84.0 83.3 84.4 85.8 85.7  PLT 180 170 160 159 151   Basic Metabolic Panel: Recent Labs  Lab 04/27/19 0528 04/28/19 0445 04/29/19 0559 04/30/19 0855 05/01/19 0431  NA 140 137 141 140 144  K 3.9 3.7 3.8 3.2* 3.9  CL 107 108 107 109 113*  CO2 25 25 25 25 25   GLUCOSE 96 96 137* 142* 143*  BUN 36* 33* 28* 26* 24*  CREATININE 0.89 0.85 0.60 0.70 0.64  CALCIUM 8.3* 8.3* 8.5* 8.4* 8.6*  MG  --   --   --  1.7 2.1   GFR: Estimated Creatinine Clearance: 112.3 mL/min (by C-G formula based on SCr of 0.64 mg/dL). Liver Function Tests: Recent Labs  Lab 04/30/19 0855 05/01/19 0431  AST 12* 11*  ALT 17 15  ALKPHOS 42 36*  BILITOT 0.5 0.4  PROT 6.5 5.9*  ALBUMIN 2.5* 2.3*   No results for input(s): LIPASE, AMYLASE in the last 168 hours. No results for input(s): AMMONIA in the last 168 hours. Coagulation Profile: No results for input(s): INR, PROTIME in the last 168 hours. Cardiac Enzymes: No results for input(s): CKTOTAL, CKMB, CKMBINDEX, TROPONINI in the last 168 hours. BNP (last 3 results) No results for input(s): PROBNP in the last 8760 hours. HbA1C: No results for input(s): HGBA1C in the last 72 hours. CBG: Recent Labs   Lab 04/30/19 1204 04/30/19 1630 04/30/19 2113 05/01/19 0759 05/01/19 1149  GLUCAP 194* 261* 315* 93 116*   Lipid Profile: No results for input(s): CHOL, HDL, LDLCALC, TRIG, CHOLHDL, LDLDIRECT in the last 72 hours. Thyroid Function Tests: Recent Labs    04/30/19 0855  TSH 1.160   Anemia Panel: No results for input(s): VITAMINB12, FOLATE, FERRITIN, TIBC, IRON, RETICCTPCT in the last 72 hours. Sepsis Labs: Recent Labs  Lab 04/29/19 0559  PROCALCITON <0.10    No results found for this or any previous visit (from the past 240 hour(s)).       Radiology Studies: No results found.      Scheduled Meds: . ARIPiprazole  5 mg Oral Daily  . vitamin C  500 mg Oral Daily  . atorvastatin  80 mg Oral q1800  . busPIRone  10 mg Oral BID  . calcium-vitamin D  1 tablet Oral Daily  . Chlorhexidine Gluconate Cloth  6 each Topical Daily  . cholecalciferol  1,000 Units Oral Q breakfast  . ciclesonide  4 puff Inhalation BID  . darifenacin  7.5 mg Oral Daily  . dexamethasone  3 mg Oral Daily  . enoxaparin (LOVENOX) injection  40 mg Subcutaneous Q12H  . famotidine  40 mg Oral Daily  .  FLUoxetine  20 mg Oral Daily  . folic acid  1 mg Oral Daily  . insulin aspart  0-20 Units Subcutaneous TID WC  . insulin aspart  0-5 Units Subcutaneous QHS  . insulin aspart  6 Units Subcutaneous TID WC  . insulin glargine  55 Units Subcutaneous Daily  . ipratropium-albuterol  3 mL Nebulization TID  . levothyroxine  88 mcg Oral QAC breakfast  . linagliptin  5 mg Oral Daily  . mouth rinse  15 mL Mouth Rinse BID  . pramipexole  0.125 mg Oral BID  . pregabalin  150 mg Oral TID  . Ensure Max Protein  11 oz Oral BID BM  . QUEtiapine  50 mg Oral QHS  . sodium chloride flush  10-40 mL Intracatheter Q12H  . zinc sulfate  220 mg Oral Daily  . zolpidem  5 mg Oral QHS   Continuous Infusions:   LOS: 25 days   Time spent= 25 mins    Serra Younan Joline Maxcy, MD [facility]  If 7PM-7AM, please  contact night-coverage  05/01/2019, 1:26 PM

## 2019-05-01 NOTE — Progress Notes (Signed)
Inpatient Diabetes Program Recommendations  AACE/ADA: New Consensus Statement on Inpatient Glycemic Control (2015)  Target Ranges:  Prepandial:   less than 140 mg/dL      Peak postprandial:   less than 180 mg/dL (1-2 hours)      Critically ill patients:  140 - 180 mg/dL   Results for [name] [name], [name] [name] (MRN [id number]) as of 05/01/2019 08:56  Ref. Range 04/30/2019 07:34 04/30/2019 12:04 04/30/2019 16:30 04/30/2019 21:13  Glucose-Capillary Latest Ref Range: 70 - 99 mg/dL 956 (H)  9 units NOVOLOG +  50 units LANTUS  194 (H)  10 units NOVOLOG  261 (H)  17 units NOVOLOG  315 (H)  4 units NOVOLOG    Results for [name] [name], [name] [name] (MRN [id number]) as of 05/01/2019 08:56  Ref. Range 05/01/2019 07:59  Glucose-Capillary Latest Ref Range: 70 - 99 mg/dL 93  6 units NOVOLOG     Home DM Meds: Tresiba 65-100 units Daily       Metformin 500 mg BID  Current Orders: Lantus 55 units Daily      Novolog Resistant Correction Scale/ SSI (0-20 units) TID AC + HS      Novolog 6 units TID with meals      Tradjenta 5 mg Daily     Patient getting Decadron 3 mg PO daily  AM CBGs have been OK--Afternoon CBGs are elevated.    MD- Note Lantus increased to 55 units Daily this AM--CBG was only 93 mg/dl this AM and pt only got 50 units Lantus yesterday.  Perhaps, patient needs more Novolog Meal Coverage?  Please consider the following:  1. Reduce Lantus back to 50 units Daily in the AM  2. Increase Novolog Meal Coverage to: Novolog 10 units TID with meals    --Will follow patient during hospitalization--  Ambrose Finland RN, MSN, CDE Diabetes Coordinator Inpatient Glycemic Control Team Team Pager: [phone] (8a-5p)

## 2019-05-02 LAB — CBC
HCT: 32.8 % — ABNORMAL LOW (ref 36.0–46.0)
Hemoglobin: 9.9 g/dL — ABNORMAL LOW (ref 12.0–15.0)
MCH: 25.9 pg — ABNORMAL LOW (ref 26.0–34.0)
MCHC: 30.2 g/dL (ref 30.0–36.0)
MCV: 85.9 fL (ref 80.0–100.0)
Platelets: 174 10*3/uL (ref 150–400)
RBC: 3.82 MIL/uL — ABNORMAL LOW (ref 3.87–5.11)
RDW: 18.2 % — ABNORMAL HIGH (ref 11.5–15.5)
WBC: 5.5 10*3/uL (ref 4.0–10.5)
nRBC: 0 % (ref 0.0–0.2)

## 2019-05-02 LAB — COMPREHENSIVE METABOLIC PANEL
ALT: 16 U/L (ref 0–44)
AST: 11 U/L — ABNORMAL LOW (ref 15–41)
Albumin: 2.5 g/dL — ABNORMAL LOW (ref 3.5–5.0)
Alkaline Phosphatase: 38 U/L (ref 38–126)
Anion gap: 6 (ref 5–15)
BUN: 27 mg/dL — ABNORMAL HIGH (ref 6–20)
CO2: 26 mmol/L (ref 22–32)
Calcium: 8.8 mg/dL — ABNORMAL LOW (ref 8.9–10.3)
Chloride: 109 mmol/L (ref 98–111)
Creatinine, Ser: 0.75 mg/dL (ref 0.44–1.00)
GFR calc Af Amer: >60 mL/min (ref >60)
GFR calc non Af Amer: >60 mL/min (ref >60)
Glucose, Bld: 125 mg/dL — ABNORMAL HIGH (ref 70–99)
Potassium: 3.9 mmol/L (ref 3.5–5.1)
Sodium: 141 mmol/L (ref 135–145)
Total Bilirubin: 0.4 mg/dL (ref 0.3–1.2)
Total Protein: 6.3 g/dL — ABNORMAL LOW (ref 6.5–8.1)

## 2019-05-02 LAB — MAGNESIUM: Magnesium: 2 mg/dL (ref 1.7–2.4)

## 2019-05-02 LAB — GLUCOSE, CAPILLARY
Glucose-Capillary: 101 mg/dL — ABNORMAL HIGH (ref 70–99)
Glucose-Capillary: 154 mg/dL — ABNORMAL HIGH (ref 70–99)
Glucose-Capillary: 272 mg/dL — ABNORMAL HIGH (ref 70–99)
Glucose-Capillary: 276 mg/dL — ABNORMAL HIGH (ref 70–99)

## 2019-05-02 NOTE — TOC Progression Note (Signed)
Transition of Care [facility]) - Progression Note    Patient Details  Name: [name] [name] MRN: [id number] Date of Birth: [birth year]  [facility] CM/SW Contact  Barrie Dunker, RN Phone Number: 05/02/2019, 11:05 AM  Clinical Narrative:    Sherron Monday to Nida Boatman with [employer] about home oxygen needs, I notified him that the patient is fluctuating between 6-7 liters high flow, he will check and see if they will be able to accommodate and call back        Expected Discharge Plan and Services                                                 Social Determinants of Health (SDOH) Interventions    Readmission Risk Interventions No flowsheet data found.

## 2019-05-02 NOTE — Progress Notes (Signed)
Physical Therapy Treatment Patient Details Name: [name] [name] MRN: [id number] DOB: Sep 03, 1960 Today's Date: 05/02/2019    History of Present Illness presented to ER secondary to SOB; admitted for management of acute respiratory disease and sepsis secondary to COVID-19 virus.    PT Comments    Patient alert, agreeable to PT, finishing breakfast. Patient informed PT she had recently been placed on 6L via respiratory. In bed at rest 86-88%, placed on 7L. Mobilized to EOB with supervision and use of log rolling technique and HOB lowered. Pt able to sit EOB with good balance, O2 turned up to 8L - 9L to be greater than 90% prior to mobility. Pt was able to transfer with CGA and RW, and take several steps to recliner, cued for continued breathing with exertion. 84-86% once in sitting, unable to recover, placed on 10L. With time, rest, and trunk support pt was able to return to 6L, 93% at end of session. The patient would benefit from further skilled PT intervention to maximize mobility, safety, and activity tolerance/endurance.     Follow Up Recommendations  Home health PT     Equipment Recommendations  None recommended by PT    Recommendations for Other Services       Precautions / Restrictions Precautions Precautions: Fall Precaution Comments: Watch HR/O2 with activity Restrictions Weight Bearing Restrictions: No    Mobility  Bed Mobility Overal bed mobility: Needs Assistance Bed Mobility: Sit to Supine     Supine to sit: Supervision;HOB elevated        Transfers Overall transfer level: Needs assistance Equipment used: Rolling walker (2 wheeled) Transfers: Sit to/from Stand Sit to Stand: Min guard         General transfer comment: Pt t/fs from recliner to EOB w/ min guard for safety. No LOB noted. Safe transfer technique. cued to continue breathing  Ambulation/Gait Ambulation/Gait assistance: Min guard Gait Distance (Feet): 3 Feet Assistive device: Rolling walker  (2 wheeled) Gait Pattern/deviations: Step-to pattern     General Gait Details: transfered to recliner but no true gait   Stairs             Wheelchair Mobility    Modified Rankin (Stroke Patients Only)       Balance Overall balance assessment: Needs assistance Sitting-balance support: Feet supported;No upper extremity supported Sitting balance-Leahy Scale: Good Sitting balance - Comments: Pt steady static sitting. No LOB noted with weight shift during functional activity   Standing balance support: Bilateral upper extremity supported;During functional activity Standing balance-Leahy Scale: Good Standing balance comment: RW provided but may not be required                            Cognition Arousal/Alertness: Awake/alert Behavior During Therapy: WFL for tasks assessed/performed Overall Cognitive Status: Within Functional Limits for tasks assessed                                        Exercises Other Exercises Other Exercises: Patient educated on PLB and continued breathing with exertional activity    General Comments General comments (skin integrity, edema, etc.): spO2 monitored continuously. 6L in bed, turned up to 7L, then 8 then 9L sitting EOB. placed on 10L HFNC to recover once in chair, and titrated back down to 6L, 93% on room air.      Pertinent Vitals/Pain Pain Assessment: No/denies pain  Home Living                      Prior Function            PT Goals (current goals can now be found in the care plan section) Progress towards PT goals: Progressing toward goals    Frequency    Min 2X/week      PT Plan Current plan remains appropriate    Co-evaluation              AM-PAC PT "6 Clicks" Mobility   Outcome Measure  Help needed turning from your back to your side while in a flat bed without using bedrails?: None Help needed moving from lying on your back to sitting on the side of a flat bed  without using bedrails?: None Help needed moving to and from a bed to a chair (including a wheelchair)?: A Little Help needed standing up from a chair using your arms (e.g., wheelchair or bedside chair)?: A Little Help needed to walk in hospital room?: A Little Help needed climbing 3-5 steps with a railing? : A Little 6 Click Score: 20    End of Session Equipment Utilized During Treatment: Oxygen;Gait belt(6L-10L) Activity Tolerance: Patient tolerated treatment well Patient left: in chair;with call bell/phone within reach;with chair alarm set Nurse Communication: Mobility status PT Visit Diagnosis: Other abnormalities of gait and mobility (R26.89);Unsteadiness on feet (R26.81)     Time: 9407-6808 PT Time Calculation (min) (ACUTE ONLY): 34 min  Charges:  $Therapeutic Exercise: 23-37 mins                     Lieutenant Diego PT, DPT 10:01 AM,05/02/19

## 2019-05-02 NOTE — Progress Notes (Signed)
PROGRESS NOTE    [name] [name]  SWN:[id number] DOB: 25-Nov-1960 DOA: 04/06/2019 PCP: [facility]   Brief Narrative:  59 year old with history of HTN, HLD, DM 2, GERD, hypothyroidism, depression, anxiety, OA, C. difficile colitis, OSA, CKD stage III presented to ER on 12/18 with shortness of breath.  Diagnosed of Covid from 12/8-12/12 treated with remdesivir and steroids.  Found to be in ARDS requiring high amounts of oxygen, self proning, aggressive pulmonary toilet.  Has been extremely slow to improve.  Patient is still requiring 6-7 L of high flow nasal cannula and slightly higher when working with therapy.  Home oxygen has been ordered, arrangements are being made.  Once these are made she can likely be discharged.   Assessment & Plan:   Principal Problem:   Acute respiratory disease due to COVID-19 virus Active Problems:   Chronic diastolic CHF (congestive heart failure) (HCC)   Rheumatoid arthritis   Type II diabetes mellitus with renal manifestations (HCC)   Sepsis (HCC)   CKD (chronic kidney disease), stage III   HLD (hyperlipidemia)  Acute hypoxic respiratory failure secondary to ARDS with recent COVID-19 infection, stable -Oxygen still fluctuating between 6-7 L nasal cannula/high flow.  Not on home oxygen prior to arrival aggressive bronchodilators-Alvesco, incentive spirometer, flutter valve -Discontinue albuterol as needed, Combivent as needed -Aggressive pulmonary toilet -Supplemental oxygen as needed.  DME oxygen ordered.  Arrangements to be made. -BNP and procalcitonin-normal  Generalized weakness -Secondary to recent critical illness and deconditioning -PT/OT-home health (ordered).  Home oxygen ordered.  Awaiting delivery. 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  She still requires 6 - 7 liters of oxygen.  Consultants:   Pulmonary  Procedures:   Pulmonary  Antimicrobials:   None   Subjective: Dyspnea with minimal exertion.  Otherwise feels okay at rest.  No new complaints from her.  Review of Systems Otherwise negative except as per HPI, including: General = no fevers, chills, dizziness, malaise, fatigue HEENT/EYES = negative for pain, redness, loss of vision, double vision, blurred vision, loss of hearing, sore throat, hoarseness, dysphagia Cardiovascular= negative for chest pain, palpitation, murmurs, lower extremity swelling Respiratory/lungs= negative forcough, hemoptysis, wheezing, mucus production Gastrointestinal= negative for nausea, vomiting,, abdominal pain, melena, hematemesis Genitourinary= negative for Dysuria, Hematuria, Change in Urinary Frequency MSK = Negative for arthralgia, myalgias, Back Pain, Joint swelling  Neurology= Negative for headache, seizures, numbness, tingling  Psychiatry= Negative for anxiety, depression, suicidal and homocidal ideation Allergy/Immunology= Medication/Food allergy as listed  Skin= Negative for Rash, lesions, ulcers, itching   Objective: Vitals:   05/01/19 2152 05/02/19 0031 05/02/19 0602 05/02/19 0750  BP: 116/60 108/63 115/70   Pulse: 82 69 (!) 58   Resp: 16 14 14    Temp:  98.4 F (36.9 C) 97.8 F (36.6 C)   TempSrc:  Oral Oral   SpO2:  100% 100% 100%  Weight:      Height:        Intake/Output Summary (Last 24 hours) at 05/02/2019 0817 Last data filed at 05/02/2019 0601 Gross per 24 hour  Intake 720 ml  Output 1150 ml  Net -430 ml   [employer]  04/06/19 1000 04/30/19 1600  Weight: (!) 145.2 kg 130 kg     Examination:  Constitutional: Not in acute distress, 6 L of oxygen Respiratory: Diffusely diminished breath sounds Cardiovascular: Normal sinus rhythm, no rubs Abdomen: Nontender nondistended good bowel sounds Musculoskeletal: No edema noted Skin: No rashes seen Neurologic: CN 2-12 grossly intact.  And nonfocal Psychiatric: Normal judgment and insight. Alert and oriented x 3. Normal mood.    Data Reviewed:   CBC: Recent Labs  Lab 04/28/19 0445 04/29/19 0559 04/30/19 0855 05/01/19 0431 05/02/19 0622  WBC 5.8 5.2 5.6 4.8 5.5  HGB 10.1* 10.2* 10.2* 9.2* 9.9*  HCT 32.5* 33.1* 33.9* 30.5* 32.8*  MCV 83.3 84.4 85.8 85.7 85.9  PLT 170 160 159 151 174   Basic Metabolic Panel: Recent Labs  Lab 04/28/19 0445 04/29/19 0559 04/30/19 0855 05/01/19 0431 05/02/19 0622  NA 137 141 140 144 141  K 3.7 3.8 3.2* 3.9 3.9  CL 108 107 109 113* 109  CO2 25 25 25 25 26   GLUCOSE 96 137* 142* 143* 125*  BUN 33* 28* 26* 24* 27*  CREATININE 0.85 0.60 0.70 0.64 0.75  CALCIUM 8.3* 8.5* 8.4* 8.6* 8.8*  MG  --   --  1.7 2.1 2.0   GFR: Estimated Creatinine Clearance: 112.3 mL/min (by C-G formula based on SCr of 0.75 mg/dL). Liver Function Tests: Recent Labs  Lab 04/30/19 0855 05/01/19 0431 05/02/19 0622  AST 12* 11* 11*  ALT 17 15 16   ALKPHOS 42 36* 38  BILITOT 0.5 0.4 0.4  PROT 6.5 5.9* 6.3*  ALBUMIN 2.5* 2.3* 2.5*   No results for input(s): LIPASE, AMYLASE in the last 168 hours. No results for input(s): AMMONIA in the last 168 hours. Coagulation Profile: No results for input(s): INR, PROTIME in the last 168 hours. Cardiac Enzymes: No results for input(s): CKTOTAL, CKMB, CKMBINDEX, TROPONINI in the last 168 hours. BNP (last 3 results) No results for input(s): PROBNP in the last 8760 hours. HbA1C: No results for input(s): HGBA1C in the last 72 hours. CBG: Recent Labs  Lab 04/30/19 2113 05/01/19 0759 05/01/19 1149 05/01/19 1646 05/01/19 2117  GLUCAP 315* 93 116* 219*  220*   Lipid Profile: No results for input(s): CHOL, HDL, LDLCALC, TRIG, CHOLHDL, LDLDIRECT in the last 72 hours. Thyroid Function Tests: Recent Labs    04/30/19 0855  TSH 1.160   Anemia Panel: No results for input(s): VITAMINB12, FOLATE, FERRITIN, TIBC, IRON, RETICCTPCT in the last 72 hours. Sepsis Labs: Recent Labs  Lab 04/29/19 0559  PROCALCITON <0.10    No results found for this or any previous visit (from the past 240 hour(s)).       Radiology Studies: No results found.      Scheduled Meds: . ARIPiprazole  5 mg Oral Daily  . vitamin C  500 mg Oral Daily  . atorvastatin  80 mg Oral q1800  . busPIRone  10 mg Oral BID  . calcium-vitamin D  1 tablet Oral Daily  . Chlorhexidine Gluconate Cloth  6 each Topical Daily  . cholecalciferol  1,000 Units Oral Q breakfast  . ciclesonide  4 puff Inhalation BID  . darifenacin  7.5 mg Oral Daily  . dexamethasone  3 mg Oral Daily  . enoxaparin (LOVENOX) injection  40 mg Subcutaneous Q12H  . famotidine  40 mg Oral Daily  . FLUoxetine  20 mg Oral Daily  . folic acid  1 mg Oral Daily  . insulin aspart  0-20 Units Subcutaneous TID WC  .  insulin aspart  0-5 Units Subcutaneous QHS  . insulin aspart  6 Units Subcutaneous TID WC  . insulin glargine  55 Units Subcutaneous Daily  . ipratropium-albuterol  3 mL Nebulization TID  . levothyroxine  88 mcg Oral QAC breakfast  . linagliptin  5 mg Oral Daily  . mouth rinse  15 mL Mouth Rinse BID  . pramipexole  0.125 mg Oral BID  . pregabalin  150 mg Oral TID  . Ensure Max Protein  11 oz Oral BID BM  . QUEtiapine  50 mg Oral QHS  . sodium chloride flush  10-40 mL Intracatheter Q12H  . zinc sulfate  220 mg Oral Daily  . zolpidem  5 mg Oral QHS   Continuous Infusions:   LOS: 26 days   Time spent= 25 mins    Anaston Koehn Joline Maxcy, MD [facility]  If 7PM-7AM, please contact night-coverage  05/02/2019, 8:17 AM

## 2019-05-02 NOTE — Progress Notes (Signed)
SATURATION QUALIFICATIONS: (This note is used to comply with regulatory documentation for home oxygen)  Patient Saturations on Room Air at Rest = 86% 6L High flow

## 2019-05-03 ENCOUNTER — Inpatient Hospital Stay: Payer: Medicaid Other

## 2019-05-03 LAB — COMPREHENSIVE METABOLIC PANEL
ALT: 17 U/L (ref 0–44)
AST: 10 U/L — ABNORMAL LOW (ref 15–41)
Albumin: 2.5 g/dL — ABNORMAL LOW (ref 3.5–5.0)
Alkaline Phosphatase: 37 U/L — ABNORMAL LOW (ref 38–126)
Anion gap: 3 — ABNORMAL LOW (ref 5–15)
BUN: 28 mg/dL — ABNORMAL HIGH (ref 6–20)
CO2: 28 mmol/L (ref 22–32)
Calcium: 8.6 mg/dL — ABNORMAL LOW (ref 8.9–10.3)
Chloride: 108 mmol/L (ref 98–111)
Creatinine, Ser: 0.53 mg/dL (ref 0.44–1.00)
GFR calc Af Amer: >60 mL/min (ref >60)
GFR calc non Af Amer: >60 mL/min (ref >60)
Glucose, Bld: 196 mg/dL — ABNORMAL HIGH (ref 70–99)
Potassium: 3.9 mmol/L (ref 3.5–5.1)
Sodium: 139 mmol/L (ref 135–145)
Total Bilirubin: 0.7 mg/dL (ref 0.3–1.2)
Total Protein: 6 g/dL — ABNORMAL LOW (ref 6.5–8.1)

## 2019-05-03 LAB — CBC
HCT: 30 % — ABNORMAL LOW (ref 36.0–46.0)
Hemoglobin: 9.2 g/dL — ABNORMAL LOW (ref 12.0–15.0)
MCH: 25.9 pg — ABNORMAL LOW (ref 26.0–34.0)
MCHC: 30.7 g/dL (ref 30.0–36.0)
MCV: 84.5 fL (ref 80.0–100.0)
Platelets: 164 10*3/uL (ref 150–400)
RBC: 3.55 MIL/uL — ABNORMAL LOW (ref 3.87–5.11)
RDW: 17.6 % — ABNORMAL HIGH (ref 11.5–15.5)
WBC: 5.3 10*3/uL (ref 4.0–10.5)
nRBC: 0 % (ref 0.0–0.2)

## 2019-05-03 LAB — GLUCOSE, CAPILLARY
Glucose-Capillary: 144 mg/dL — ABNORMAL HIGH (ref 70–99)
Glucose-Capillary: 152 mg/dL — ABNORMAL HIGH (ref 70–99)
Glucose-Capillary: 208 mg/dL — ABNORMAL HIGH (ref 70–99)
Glucose-Capillary: 296 mg/dL — ABNORMAL HIGH (ref 70–99)

## 2019-05-03 LAB — MAGNESIUM: Magnesium: 1.8 mg/dL (ref 1.7–2.4)

## 2019-05-03 MED ORDER — NYSTATIN 100000 UNIT/GM EX POWD
Freq: Two times a day (BID) | CUTANEOUS | Status: DC
  Filled 2019-05-03: qty 15

## 2019-05-03 MED ORDER — NYSTATIN 100000 UNIT/GM EX CREA
TOPICAL_CREAM | Freq: Two times a day (BID) | CUTANEOUS | Status: DC
  Filled 2019-05-03: qty 15

## 2019-05-03 MED ORDER — FUROSEMIDE 10 MG/ML IJ SOLN
20.0000 mg | Freq: Once | INTRAMUSCULAR | Status: AC
  Administered 2019-05-03: 20 mg via INTRAVENOUS
  Filled 2019-05-03: qty 4

## 2019-05-03 NOTE — Unmapped (Signed)
Encounter opened in error, but patient no-showed to this appointment.    Tommy Rainwater, MD, PGY-4  [facility]  [phone]

## 2019-05-03 NOTE — Unmapped (Incomplete)
GASTROENTEROLOGY CONSULTATION       Requesting Attending Physician:  Amanda Senters, FNP    Reason for Consult:    [name] is a 59 y.o. female seen in consultation at the request of Dr. Loran Senters, FNP for nausea and vomiting.    Assessment and Recommendations:   Amanda Hanson is a 59 y.o. female with a history of ***            Rutherford Limerick, MD  Fellow, PGY-5  [employer] [facility]  Division of Gastroenterology & Hepatology    History of Present Illness:      Chief Complaint: nausea and vomiting    HPI:   59 y.o. female with a PMH of T2DM  multiple abdominal surgeries for hernias, RA on methotrexate,  cholecystectomy, HLD, hypothyroidism, obesity, and sleep apnea, who is presenting to the for further evaluation of nausea and vomiting.    She was admitted October 2020 with right lower quadrant pain nausea, vomiting and diarrhea.  She had a CT scan with a small fat/fluid containing ventral hernia with unchanged mesenteric and pelvic adenopathy. She was presumed to have a possible viral gastroenteritis.  She represented later that month emergency room on 10/30 with similar symptoms and she had a KUB that was unremarkable and her symptoms improved with GI cocktail.  She returned a day later on 10/31 with recurrent symptoms and she was given a prescription for Zofran and Phenergan.  She saw her PCP on 11/9 and it was presumed that her symptoms of vomiting were secondary to diabetic gastroparesis. Of note, her A1C ranges between 11% ane 13%.  Additionally she has a history of constipation and takes stool softeners.    A review of CareEverywhere shows that she had had issues with nausea,vomiting, and diarrhea since at least 2006    She had a gastric emptying study in 2008 that was normal ordered for nausea and vomiting. She also had a 2008 upper GI series that was negative. She had a 2008 EGD and colonoscopy hat we do not have access to. She had recent labs with a hgb of 9.2,         Her medication regimen consists of methotrexate, atorvastatin, Phenergan, levothyroxine, pramipexole, Seroquel, tocilizumab, fluoxetine      Review of Systems:  The balance of 12 systems reviewed is negative except as noted in the HPI.     Medical History:   Allergies:  Cephalexin, Doxycycline, Sulfa (sulfonamide antibiotics), Codeine, Darvocet a500  [propoxyphene n-acetaminophen], Hydrocodone, Meropenem, and Sulfasalazine    Medications:   Prior to Admission medications    Medication Dose, Route, Frequency   acetaminophen (TYLENOL) 500 MG tablet 500 mg, Oral, 3 times a day PRN   ARIPiprazole (ABILIFY) 5 MG tablet 5 mg, Oral, Daily (standard)   atorvastatin (LIPITOR) 80 MG tablet 80 mg, Oral, Every evening   blood sugar diagnostic (ACCU-CHEK GUIDE) Strp Other, 4 times a day (ACHS), Acuu-chek Guide.   busPIRone (BUSPAR) 10 MG tablet 10 mg, Oral, 2 times a day   calcium-vitamin D 250-100 mg-unit per tablet 1 tablet, Oral, 2 times a day (standard)   diclofenac sodium (VOLTAREN) 1 % gel 2 g, Topical, Nightly   empty container Misc Use as directed   FLUoxetine (PROZAC) 20 MG capsule 20 mg, Oral, Daily (standard)   folic acid (FOLVITE) 1 MG tablet 1 mg, Oral, Daily (standard)   insulin degludec (TRESIBA FLEXTOUCH U-200) 200 unit/mL (3  mL) InPn Use daily as directed by MD - start with 65 units daily, up to 100 units daily.   insulin lispro (HUMALOG U-100 INSULIN) 100 unit/mL injection INJECT 0.20ML (20 UNITS TOTAL) UNDER THE SKIN THREE TIMES A DAY BEFORE MEALS.   insulin syringes, disposable, 1 mL Syrg 100 Units, Miscellaneous, Daily (standard), Insulin injecting diabetes 250.00<BR>Inject daily<BR>100 unit syringe (1ml)   levothyroxine (SYNTHROID) 88 MCG tablet 88 mcg, Oral, Daily (standard)   LYRICA 150 mg capsule 150 mg, Oral, 3 times a day (standard)   methotrexate 25 mg/mL injection solution 25 mg, Subcutaneous, Weekly nystatin (MYCOSTATIN) 100,000 unit/gram powder 1 application, Topical, 4 times daily PRN   nystatin (MYCOSTATIN) 100,000 unit/mL suspension 500,000 Units, Oral, 4 times a day   oxyCODONE (ROXICODONE) 5 MG immediate release tablet 5 mg, Oral, Every 4 hours PRN   pramipexole (MIRAPEX) 0.125 MG tablet 0.125 mg, Oral, Nightly, Can increase to 2 tablets (0.250 mg) in one week if one tablet is not effective.   predniSONE (DELTASONE) 5 MG tablet Take 20 mg daily x 2 weeks, 15 mg daily x 2 weeks, 10 mg daily x 2 weeks, then 5 mg until visit with rheumatology   promethazine (PHENERGAN) 25 MG suppository 25 mg, Rectal, Every 6 hours PRN, May use suppository if unable to tolerate PO.   promethazine (PHENERGAN) 25 MG tablet 25 mg, Oral, Every 6 hours PRN   QUEtiapine (SEROQUEL) 300 MG tablet 300 mg, Oral, Nightly   senna (SENOKOT) 8.6 mg tablet 2 tablets, Oral, Nightly PRN   solifenacin (VESICARE) 5 MG tablet 5 mg, Oral, Daily (standard)   tocilizumab (ACTEMRA ACTPEN) 162 mg/0.9 mL PnIj Inject the contents of 1 pen (162 mg) under the skin every seven (7) days.   zolpidem (AMBIEN) 5 MG tablet 5 mg, Oral, Nightly PRN       Medical History:  Past Medical History:   Diagnosis Date   ??? Abnormal mammogram    ??? Anxiety disorder    ??? Arthritis    ??? Congestive heart failure (CHF) (CMS-HCC)    ??? DDD (degenerative disc disease)    ??? Diabetes mellitus type 2 in obese (CMS-HCC) 2011   ??? GERD (gastroesophageal reflux disease)    ??? Hyperlipidemia    ??? Hypothyroidism    ??? Mixed incontinence urge and stress (female)(female)    ??? Obesity    ??? Other functional disorder of bladder    ??? Rheumatoid arthritis(714.0)    ??? Sleep apnea    ??? Urinary frequency    ??? Urinary obstruction, not elsewhere classified        Surgical History:  Past Surgical History:   Procedure Laterality Date   ??? BREAST BIOPSY Left 5 15 2017    with clip   ??? CHOLECYSTECTOMY     ??? Cystoscopy with Hydrodistention      11/2004   ??? HERNIA REPAIR     ??? HYSTERECTOMY  2000 ??? Interstim Placement      02/2005   ??? LAPAROSCOPIC GASTRIC BANDING     ??? OOPHORECTOMY Bilateral 2000   ??? PR AMPUTATION METATARSAL+TOE,SINGLE Right 08/24/2018    Procedure: PRIORITY 2nd ray amputation;  Surgeon: Karen Chafe, DPM;  Location: MAIN OR [facility];  Service: Vascular   ??? PR AMPUTATION TOE,MT-P JT Right 02/23/2018    Procedure: AMPUTATION, TOE; METATARSOPHALANGEAL JOINT;  Surgeon: Karen Chafe, DPM;  Location: MAIN OR [facility];  Service: Vascular   ??? PR AMPUTATION TOE,MT-P JT Right 08/02/2018    Procedure: AMPUTATION,  TOE; METATARSOPHALANGEAL JOINT;  Surgeon: Webb Silversmith, MD;  Location: MAIN OR [facility];  Service: Vascular   ??? PR DEBRIDEMENT, SKIN, SUB-Q TISSUE,=<20 SQ CM Right 11/15/2017    Procedure: Debridement of right groin/perineal wound;  Surgeon: Joanie Coddington, MD;  Location: MAIN OR [facility];  Service: Trauma   ??? PR DEBRIDEMENT, SKIN, SUB-Q TISSUE,MUSCLE,BONE,=<20 SQ CM Right 02/28/2018    Procedure: DEBRIDEMENT; SKIN, SUBCUTANEOUS TISSUE, MUSCLE, & BONE FOOT;  Surgeon: Karen Chafe, DPM;  Location: MAIN OR [facility];  Service: Vascular   ??? PR DEBRIDEMENT, SKIN, SUB-Q TISSUE,MUSCLE,BONE,=<20 SQ CM Right 03/28/2018    Procedure: DEBRIDEMENT; SKIN, SUBCUTANEOUS TISSUE, MUSCLE, & BONE FOOT;  Surgeon: Karen Chafe, DPM;  Location: MAIN OR [facility];  Service: Vascular   ??? PR FULL EXCIS 1ST METATARSAL HEAD Right 03/28/2018    Procedure: OSTECTOMY COMPLT EXC; FIRST METATARSAL HEAD;  Surgeon: Karen Chafe, DPM;  Location: MAIN OR [facility];  Service: Vascular   ??? PR I&D OF VULVA/PERINEUM ABSCESS Left 12/13/2018    Procedure: Incision And Drainage Of Vulva Or Perineal Abscess;  Surgeon: Joanie Coddington, MD;  Location: MAIN OR [facility];  Service: Trauma   ??? PR I&D PERIANAL ABSCESS,SUPERFICIAL Left 12/13/2018 Procedure: INCISION AND DRAINAGE, PERIANAL ABSCESS, SUPERFICIAL;  Surgeon: Joanie Coddington, MD;  Location: MAIN OR [facility];  Service: Trauma   ??? PR INCIS/DRAIN THIGH/KNEE ABSCESS,DEEP Left 06/14/2018    Procedure: Deep thigh abscess drainage;  Surgeon: Suella Broad, MD;  Location: MAIN OR [facility];  Service: Trauma   ??? PR SECD CLOS SURG WND EXTEN/COMPLIC Right 02/28/2018    Procedure: SECONDARY CLOSURE OF SURGICAL WOUND OR DEHISCENCE, EXTENSIVE OR COMPLICATED;  Surgeon: Karen Chafe, DPM;  Location: MAIN OR [facility];  Service: Vascular       Social History:  Tobacco use:   reports that she quit smoking about 18 years ago. Her smoking use included cigarettes. She has a 54.00 pack-year smoking history. She quit smokeless tobacco use about 18 years ago.  Alcohol use:   reports current alcohol use.  Drug use:  reports no history of drug use.    Family History:  Family History   Problem Relation Age of Onset   ??? Cancer Maternal Grandmother         Lung cancer   ??? Diabetes Maternal Grandmother    ??? Rheum arthritis Sister    ??? GU problems Neg Hx    ??? Kidney cancer Neg Hx    ??? Prostate cancer Neg Hx          Objective    Vital Signs/Weight:    Wt Readings from Last 3 Encounters:   03/21/19 (!) 146.1 kg (322 lb)   03/12/19 (!) 149.7 kg (330 lb)   02/26/19 (!) 144.7 kg (319 lb)       Physical Exam:  Telephone visit    Diagnostic Studies:  I reviewed all pertinent diagnostic studies, including:      Labs:      Lab Results   Component Value Date    WBC 6.3 03/12/2019    RBC 4.40 03/12/2019    HGB 12.0 (L) 03/12/2019    HCT 35.4 (L) 03/12/2019    MCV 80.4 03/12/2019    MCH 27.3 03/12/2019    MCHC 34.0 03/12/2019    RDW 16.1 (H) 03/12/2019    PLT 181 03/12/2019    MPV 7.7 03/12/2019       Chemistry        Component  Value Date/Time    NA 135 03/12/2019 1538    NA 139 12/06/2010 1127    K 4.7 03/12/2019 1538    K 3.5 12/06/2010 1127    CL 100 03/12/2019 1538    CL 104 12/06/2010 1127 CO2 29.0 03/12/2019 1538    CO2 25 12/06/2010 1127    BUN 11 03/12/2019 1538    BUN 8 12/06/2010 1127    CREATININE 0.96 03/12/2019 1538    CREATININE 0.58 (L) 12/06/2010 1127    GLU 385 (H) 03/12/2019 1538        Component Value Date/Time    CALCIUM 9.7 03/12/2019 1538    CALCIUM 9.4 12/06/2010 1127    ALKPHOS 97 03/12/2019 1538    ALKPHOS 95 12/06/2010 1127    AST 32 03/12/2019 1538    AST 38 12/06/2010 1127    ALT 24 03/12/2019 1538    ALT 31 12/06/2010 1127    BILITOT 1.0 03/12/2019 1538    BILITOT 0.7 12/06/2010 1127                No results for input(s): IRON, TIBC, FERRITIN in the last 72 hours.  Lab Results   Component Value Date    HEPBCAB Nonreactive 06/23/2015    HEPCAB Nonreactive 06/23/2015     Lab Results   Component Value Date    IGG 1,199 11/18/2017       Imaging:   01/2019 CT scan    IMPRESSION:  -No evidence for appendicitis as clinically questioned.  - Small fat and fluid containing ventral hernias, grossly unchanged from prior exam.   -Unchanged mesenteric and pelvic adenopathy. Attention on future examination.  - Other chronic and incidental findings, as detailed above.     GI Procedures:   none

## 2019-05-03 NOTE — Progress Notes (Signed)
PROGRESS NOTE    [name] [name]  DGL:[id number] DOB: [birth year] DOA: 04/06/2019 PCP: [facility]   Brief Narrative:  59 year old with history of HTN, HLD, DM 2, GERD, hypothyroidism, depression, anxiety, OA, C. difficile colitis, OSA, CKD stage III presented to ER on 12/18 with shortness of breath.  Diagnosed of Covid from 12/8-12/12 treated with remdesivir and steroids.  Found to be in ARDS requiring high amounts of oxygen, self proning, aggressive pulmonary toilet.  Has been extremely slow to improve.  Patient is still requiring 6-7 L of high flow nasal cannula and slightly higher when working with therapy.  Home oxygen has been ordered, arrangements are being made.  Once these are made she can likely be discharged.   Assessment & Plan:   Principal Problem:   Acute respiratory disease due to COVID-19 virus Active Problems:   Chronic diastolic CHF (congestive heart failure) (HCC)   Rheumatoid arthritis   Type II diabetes mellitus with renal manifestations (HCC)   Sepsis (HCC)   CKD (chronic kidney disease), stage III   HLD (hyperlipidemia)  Acute hypoxic respiratory failure secondary to ARDS with recent COVID-19 infection, stable Acute on chronic HFpEF -Oxygen still fluctuating between 6-7 L nasal cannula/high flow.  Not on home oxygen prior to arrival aggressive bronchodilators-Alvesco, incentive spirometer, flutter valve -Discontinue albuterol as needed, Combivent as needed -Aggressive pulmonary toilet -Supplemental oxygen as needed.  DME oxygen ordered.  Arrangements to be made. -BNP and procalcitonin-normal Currently on Decadron taper.  Continue for now Responding to IV Lasix. May require as needed Lasix at the time of the discharge.  Generalized weakness -Secondary to recent critical illness and deconditioning -PT/OT-home health (ordered).  Home oxygen ordered.  Awaiting delivery. 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  Still requires significant amount of oxygen that precludes a safe discharge plan for now.  Consultants:   Pulmonary  Procedures:   Pulmonary  Antimicrobials:   None   Subjective: Denies any nausea or vomiting.  Continues to have cough as well as shortness of breath.  No fever no chills.  Review of Systems Otherwise negative except as per HPI, including: General = no fevers, chills, dizziness, malaise, fatigue HEENT/EYES = negative for pain, redness, loss of vision, double vision, blurred vision, loss of hearing, sore throat, hoarseness, dysphagia Cardiovascular= negative for chest pain, palpitation, murmurs, lower extremity swelling Respiratory/lungs= negative forcough, hemoptysis, wheezing, mucus production Gastrointestinal= negative for nausea, vomiting,, abdominal pain, melena, hematemesis Genitourinary= negative for Dysuria, Hematuria, Change in Urinary Frequency MSK = Negative for arthralgia, myalgias, Back Pain, Joint swelling  Neurology= Negative for headache, seizures, numbness, tingling  Psychiatry= Negative for anxiety, depression, suicidal and homocidal ideation Allergy/Immunology= Medication/Food allergy as listed  Skin= Negative for Rash, lesions, ulcers, itching   Objective: Vitals:   05/03/19 0756 05/03/19 1350 05/03/19 1602 05/03/19 1938  BP: (!) 102/50  92/65   Pulse: 70  80   Resp: 16     Temp: 97.7 F (36.5 C)     TempSrc: Oral     SpO2: 96% 93% 94% 95%  Weight:      Height:        Intake/Output  Summary (Last 24 hours) at 05/03/2019 2018 Last data filed at 05/03/2019  1342 Gross per 24 hour  Intake 360 ml  Output 2000 ml  Net -1640 ml   Filed Weights   04/06/19 1000 04/30/19 1600 05/03/19 0736  Weight: (!) 145.2 kg 130 kg 134.4 kg    Examination:  Constitutional: Not in acute distress, 6 L of oxygen Respiratory: Diffusely diminished breath sounds Cardiovascular: Normal sinus rhythm, no rubs Abdomen: Nontender nondistended good bowel sounds Musculoskeletal: No edema noted Skin: No rashes seen Neurologic: CN 2-12 grossly intact.  And nonfocal Psychiatric: Normal judgment and insight. Alert and oriented x 3. Normal mood.    Data Reviewed:   CBC: Recent Labs  Lab 04/29/19 0559 04/30/19 0855 05/01/19 0431 05/02/19 0622 05/03/19 0330  WBC 5.2 5.6 4.8 5.5 5.3  HGB 10.2* 10.2* 9.2* 9.9* 9.2*  HCT 33.1* 33.9* 30.5* 32.8* 30.0*  MCV 84.4 85.8 85.7 85.9 84.5  PLT 160 159 151 174 164   Basic Metabolic Panel: Recent Labs  Lab 04/29/19 0559 04/30/19 0855 05/01/19 0431 05/02/19 0622 05/03/19 0330  NA 141 140 144 141 139  K 3.8 3.2* 3.9 3.9 3.9  CL 107 109 113* 109 108  CO2 25 25 25 26 28   GLUCOSE 137* 142* 143* 125* 196*  BUN 28* 26* 24* 27* 28*  CREATININE 0.60 0.70 0.64 0.75 0.53  CALCIUM 8.5* 8.4* 8.6* 8.8* 8.6*  MG  --  1.7 2.1 2.0 1.8   GFR: Estimated Creatinine Clearance: 114.5 mL/min (by C-G formula based on SCr of 0.53 mg/dL). Liver Function Tests: Recent Labs  Lab 04/30/19 0855 05/01/19 0431 05/02/19 0622 05/03/19 0330  AST 12* 11* 11* 10*  ALT 17 15 16 17   ALKPHOS 42 36* 38 37*  BILITOT 0.5 0.4 0.4 0.7  PROT 6.5 5.9* 6.3* 6.0*  ALBUMIN 2.5* 2.3* 2.5* 2.5*   No results for input(s): LIPASE, AMYLASE in the last 168 hours. No results for input(s): AMMONIA in the last 168 hours. Coagulation Profile: No results for input(s): INR, PROTIME in the last 168 hours. Cardiac Enzymes: No results for input(s): CKTOTAL, CKMB, CKMBINDEX, TROPONINI in the last 168 hours. BNP (last 3 results) No results for input(s): PROBNP  in the last 8760 hours. HbA1C: No results for input(s): HGBA1C in the last 72 hours. CBG: Recent Labs  Lab 05/02/19 1623 05/02/19 2205 05/03/19 0750 05/03/19 1143 05/03/19 1648  GLUCAP 272* 276* 144* 152* 208*   Lipid Profile: No results for input(s): CHOL, HDL, LDLCALC, TRIG, CHOLHDL, LDLDIRECT in the last 72 hours. Thyroid Function Tests: No results for input(s): TSH, T4TOTAL, FREET4, T3FREE, THYROIDAB in the last 72 hours. Anemia Panel: No results for input(s): VITAMINB12, FOLATE, FERRITIN, TIBC, IRON, RETICCTPCT in the last 72 hours. Sepsis Labs: Recent Labs  Lab 04/29/19 0559  PROCALCITON <0.10    No results found for this or any previous visit (from the past 240 hour(s)).       Radiology Studies: DG Chest Port 1 View  Result Date: 05/03/2019 CLINICAL DATA:  Shortness of breath. Positive for Covid-19 on 03/23/2019. Sepsis. Acute respiratory disease. EXAM: PORTABLE CHEST 1 VIEW COMPARISON:  04/20/2019 FINDINGS: Shallow lung inflation. Patchy parenchymal opacities are identified throughout the lungs bilaterally, similar in appearance to prior studies. IMPRESSION: Similar dense opacities in the lungs. Electronically Signed   By: 14/07/2018 M.D.   On: 05/03/2019 09:49        Scheduled Meds: . ARIPiprazole  5 mg Oral Daily  . vitamin C  500 mg Oral Daily  . atorvastatin  80 mg Oral q1800  . busPIRone  10 mg Oral BID  . calcium-vitamin D  1 tablet Oral Daily  . Chlorhexidine Gluconate Cloth  6 each Topical Daily  . cholecalciferol  1,000 Units Oral Q breakfast  . ciclesonide  4 puff Inhalation BID  . darifenacin  7.5 mg Oral Daily  . dexamethasone  3 mg Oral Daily  . enoxaparin (LOVENOX) injection  40 mg Subcutaneous Q12H  . famotidine  40 mg Oral Daily  . FLUoxetine  20 mg Oral Daily  . folic acid  1 mg Oral Daily  . insulin aspart  0-20 Units Subcutaneous TID WC  . insulin aspart  0-5 Units Subcutaneous QHS  . insulin aspart  6 Units Subcutaneous TID  WC  . insulin glargine  55 Units Subcutaneous Daily  . ipratropium-albuterol  3 mL Nebulization TID  . levothyroxine  88 mcg Oral QAC breakfast  . linagliptin  5 mg Oral Daily  . mouth rinse  15 mL Mouth Rinse BID  . nystatin   Topical BID  . nystatin cream   Topical BID  . pramipexole  0.125 mg Oral BID  . pregabalin  150 mg Oral TID  . Ensure Max Protein  11 oz Oral BID BM  . QUEtiapine  50 mg Oral QHS  . sodium chloride flush  10-40 mL Intracatheter Q12H  . zinc sulfate  220 mg Oral Daily  . zolpidem  5 mg Oral QHS   Continuous Infusions:   LOS: 27 days   Time spent= 25 mins    Berle Mull, MD [facility]  If 7PM-7AM, please contact night-coverage  05/03/2019, 8:18 PM

## 2019-05-03 NOTE — Progress Notes (Signed)
Occupational Therapy Treatment Patient Details Name: [name] [name] MRN: [id number] DOB: [birth year] Today's Date: 05/03/2019    History of present illness presented to ER secondary to SOB; admitted for management of acute respiratory disease and sepsis secondary to COVID-19 virus.   OT comments  [name] [name] was seen for OT tx session this date. Pt received side-lying in bed on 4 L/m HFNC with spO2 >90% at rest. Pt spO2 noted to drop to 87% with seated LB dressing task. Pt O2 increased to 5 LPM through HFNC and pt encouraged to utilize ECS including PLB and pacing t/o LB dressing to maximize safety and functional independence. OT engages pt in LB dressing task (see ADL section below for additional details). Pt demonstrates good recall and carryover of education provided at prior sessions. She requires only minimal prompting for incorporation of energy conservation strategies. Pt requesting to return to bed at end of OT session. Completes log roll bed mobility technique with supervision for safety. Pt progressingtoward OTgoals andcontinues to benefit from skilled OT services to maximize return to PLOF and minimize risk of future falls, injury, caregiver burden, and readmission. Will continue to follow POC as written. Discharge recommendation updated to [facility] to maximize pt safety and return to functional independence within her natural context and during meaningful occupations of daily life.    Follow Up Recommendations  Home health OT    Equipment Recommendations  None recommended by OT    Recommendations for Other Services      Precautions / Restrictions Precautions Precautions: Fall Precaution Comments: Watch HR/O2 with activity Restrictions Weight Bearing Restrictions: No       Mobility Bed Mobility Overal bed mobility: Needs Assistance Bed Mobility: Supine to Sit;Sit to Supine     Supine to sit: Supervision Sit to supine: Supervision   General bed mobility comments: Pt return  demos understanding of log-roll technique for bed mobility.  Transfers Overall transfer level: Needs assistance Equipment used: Rolling walker (2 wheeled)                  Balance Overall balance assessment: Needs assistance Sitting-balance support: Feet supported;No upper extremity supported Sitting balance-Leahy Scale: Good Sitting balance - Comments: Pt steady static sitting. No LOB noted with weight shift during functional activity                                   ADL either performed or assessed with clinical judgement   ADL Overall ADL's : Needs assistance/impaired                     Lower Body Dressing: With adaptive equipment;Set up;Sit to/from stand;Cueing for compensatory techniques Lower Body Dressing Details (indicate cue type and reason): OT engages pt in functional LB dressing task. Pt able to use sock-aid to don bilateral hospital socks this date. She demonstrates good recall and carryover of education from prior OT sessions. She is able to sit EOB and don socks with spO2 stable at 88-90% t/o on 5L [name] [name]. OT provides prompting for incorporation of energy conservation strategies t/o task.               General ADL Comments: Pt continues to be limited by decreased activity tolerance and cardiopulmonary status during OT session.     Vision Baseline Vision/History: Wears glasses Wears Glasses: Reading only Patient Visual Report: No change from baseline     Perception  Praxis      Cognition Arousal/Alertness: Awake/alert Behavior During Therapy: WFL for tasks assessed/performed Overall Cognitive Status: Within Functional Limits for tasks assessed                                          Exercises Other Exercises Other Exercises: Pt educated in safe use of AE for LB ADL Management with energy conservation education provided throughout. Pt return demonstrates use of AE to don bilat hospital socks with min VCs for  technique and supervision for ssafety.   Shoulder Instructions       General Comments spO2 monitored t/o session. Pt on 4L [place] in bed. Turned to 5L during seated activity. SpO2 remains stable between 87-91% during functional tasks. Rebounds to 91-92% with cues for PLB and rest breaks. HR remains in 90's t/o session.    Pertinent Vitals/ Pain       Pain Assessment: Faces Faces Pain Scale: Hurts a little bit Pain Location: Chest/lungs from coughing Pain Descriptors / Indicators: Burning Pain Intervention(s): Limited activity within patient's tolerance;Monitored during session;Patient requesting pain meds-RN notified;Repositioned  Home Living                                          Prior Functioning/Environment              Frequency  Min 1X/week        Progress Toward Goals  OT Goals(current goals can now be found in the care plan section)  Progress towards OT goals: Progressing toward goals  Acute Rehab OT Goals Patient Stated Goal: to get strength back OT Goal Formulation: With patient Time For Goal Achievement: 05/08/19 Potential to Achieve Goals: Good  Plan Frequency remains appropriate;Discharge plan needs to be updated    Co-evaluation                 AM-PAC OT "6 Clicks" Daily Activity     Outcome Measure   Help from another person eating meals?: None Help from another person taking care of personal grooming?: A Little Help from another person toileting, which includes using toliet, bedpan, or urinal?: A Little Help from another person bathing (including washing, rinsing, drying)?: A Lot Help from another person to put on and taking off regular upper body clothing?: A Little Help from another person to put on and taking off regular lower body clothing?: A Little 6 Click Score: 18    End of Session Equipment Utilized During Treatment: Other (comment)(sock aid)  OT Visit Diagnosis: Other abnormalities of gait and mobility  (R26.89);Muscle weakness (generalized) (M62.81)   Activity Tolerance Patient tolerated treatment well   Patient Left in bed;with call bell/phone within reach;with bed alarm set   Nurse Communication Other (comment)(Pt requesting cough medicine)        Time: 8921-1941 OT Time Calculation (min): 18 min  Charges: OT General Charges $OT Visit: 1 Visit OT Treatments $Self Care/Home Management : 8-22 mins  Rockney Ghee, M.S., OTR/L Ascom: [phone] 05/03/19, 4:15 PM

## 2019-05-04 ENCOUNTER — Ambulatory Visit: Admit: 2019-05-04 | Discharge: 2019-05-05 | Payer: MEDICAID | Attending: Rheumatology | Primary: Rheumatology

## 2019-05-04 LAB — GLUCOSE, CAPILLARY
Glucose-Capillary: 114 mg/dL — ABNORMAL HIGH (ref 70–99)
Glucose-Capillary: 156 mg/dL — ABNORMAL HIGH (ref 70–99)

## 2019-05-04 LAB — CBC
HCT: 31.1 % — ABNORMAL LOW (ref 36.0–46.0)
Hemoglobin: 9.6 g/dL — ABNORMAL LOW (ref 12.0–15.0)
MCH: 25.9 pg — ABNORMAL LOW (ref 26.0–34.0)
MCHC: 30.9 g/dL (ref 30.0–36.0)
MCV: 84.1 fL (ref 80.0–100.0)
Platelets: 171 10*3/uL (ref 150–400)
RBC: 3.7 MIL/uL — ABNORMAL LOW (ref 3.87–5.11)
RDW: 17.7 % — ABNORMAL HIGH (ref 11.5–15.5)
WBC: 5.5 10*3/uL (ref 4.0–10.5)
nRBC: 0 % (ref 0.0–0.2)

## 2019-05-04 LAB — BASIC METABOLIC PANEL
Anion gap: 7 (ref 5–15)
BUN: 30 mg/dL — ABNORMAL HIGH (ref 6–20)
CO2: 25 mmol/L (ref 22–32)
Calcium: 8.6 mg/dL — ABNORMAL LOW (ref 8.9–10.3)
Chloride: 107 mmol/L (ref 98–111)
Creatinine, Ser: 0.76 mg/dL (ref 0.44–1.00)
GFR calc Af Amer: >60 mL/min (ref >60)
GFR calc non Af Amer: >60 mL/min (ref >60)
Glucose, Bld: 135 mg/dL — ABNORMAL HIGH (ref 70–99)
Potassium: 3.9 mmol/L (ref 3.5–5.1)
Sodium: 139 mmol/L (ref 135–145)

## 2019-05-04 LAB — MAGNESIUM: Magnesium: 1.8 mg/dL (ref 1.7–2.4)

## 2019-05-04 MED ORDER — ENSURE MAX PROTEIN PO LIQD: 11.0000 [oz_av] | Freq: Two times a day (BID) | ORAL | 0 refills | Status: DC

## 2019-05-04 MED ORDER — ZINC SULFATE 220 (50 ZN) MG PO CAPS: 220.0000 mg | ORAL_CAPSULE | Freq: Every day | ORAL | 0 refills | Status: DC

## 2019-05-04 MED ORDER — FAMOTIDINE 20 MG PO TABS: 20.0000 mg | ORAL_TABLET | Freq: Every day | ORAL | 0 refills | Status: AC

## 2019-05-04 MED ORDER — ASCORBIC ACID 500 MG PO TABS: 500.0000 mg | ORAL_TABLET | Freq: Every day | ORAL | 0 refills | Status: DC

## 2019-05-04 MED ORDER — FUROSEMIDE 20 MG PO TABS: 20.0000 mg | ORAL_TABLET | Freq: Every day | ORAL | 0 refills | Status: DC | PRN

## 2019-05-04 MED ORDER — ALVESCO 160 MCG/ACT IN AERS: 4.0000 | INHALATION_SPRAY | Freq: Two times a day (BID) | RESPIRATORY_TRACT | 0 refills | Status: DC

## 2019-05-04 MED ORDER — ALPRAZOLAM 0.5 MG PO TABS: 0.5000 mg | ORAL_TABLET | Freq: Two times a day (BID) | ORAL | 0 refills | Status: DC | PRN

## 2019-05-04 MED ORDER — LINAGLIPTIN 5 MG PO TABS: 5.0000 mg | ORAL_TABLET | Freq: Every day | ORAL | 0 refills | Status: DC

## 2019-05-04 MED ORDER — DEXAMETHASONE 1 MG PO TABS: ORAL_TABLET | ORAL | 0 refills | Status: DC

## 2019-05-04 MED ORDER — IPRATROPIUM-ALBUTEROL 0.5-2.5 (3) MG/3ML IN SOLN: 3.0000 mL | Freq: Three times a day (TID) | RESPIRATORY_TRACT | 0 refills | Status: DC

## 2019-05-04 MED ORDER — POLYETHYLENE GLYCOL 3350 17 G PO PACK: 17.0000 g | PACK | Freq: Every day | ORAL | 0 refills | Status: DC | PRN

## 2019-05-04 NOTE — Progress Notes (Signed)
Physical Therapy Treatment Patient Details Name: [name] [name] MRN: [id number] DOB: 10/27/1960 Today's Date: 05/04/2019    History of Present Illness presented to ER secondary to SOB; admitted for management of acute respiratory disease and sepsis secondary to COVID-19 virus.    PT Comments    Patient alert, eager to mobilize with PT. Patient on 4.5L at start of session, placed on 5L due to mild desaturation to 80s. Pt transferred out of bed to chair with supervision and use RW. Session focused on promoting gait and activity tolerance. The patient was able to ambulate 18ft twice with seated rest break in between on 6L. Pt did desaturate to 82% after each bout of ambulation, but was able to increase to >90% with rest and PLB. Pt returned to 4L at end of session up in chair at 92% in no acute distress. The patient would benefit from further skilled PT intervention to continue to maximize mobility, safety, and activity tolerance/endurance.      Follow Up Recommendations  Home health PT     Equipment Recommendations  None recommended by PT    Recommendations for Other Services       Precautions / Restrictions Precautions Precautions: Fall Precaution Comments: Watch HR/O2 with activity Restrictions Weight Bearing Restrictions: No    Mobility  Bed Mobility Overal bed mobility: Needs Assistance Bed Mobility: Supine to Sit     Supine to sit: Supervision Sit to supine: Supervision   General bed mobility comments: Pt return demos understanding of log-roll technique for bed mobility.  Transfers Overall transfer level: Needs assistance Equipment used: Rolling walker (2 wheeled) Transfers: Sit to/from Stand Sit to Stand: Min guard;Min assist         General transfer comment: minA from low chair, cued for hand placement anterior weight shift, able to perform with CGA  Ambulation/Gait Ambulation/Gait assistance: Min guard Gait Distance (Feet): 20 Feet(44ft with seated rest  break, then additional 54ft)     Gait velocity: decreased   General Gait Details: Very small, cautious steps. On 6L of oxygen throughout. Desat to low 80s after ambulation, able to increase >90% with rest.   Stairs             Wheelchair Mobility    Modified Rankin (Stroke Patients Only)       Balance Overall balance assessment: Needs assistance Sitting-balance support: Feet supported;No upper extremity supported Sitting balance-Leahy Scale: Good Sitting balance - Comments: Pt steady static sitting. No LOB noted with weight shift during functional activity   Standing balance support: During functional activity Standing balance-Leahy Scale: Good                              Cognition Arousal/Alertness: Awake/alert Behavior During Therapy: WFL for tasks assessed/performed Overall Cognitive Status: Within Functional Limits for tasks assessed                                        Exercises      General Comments General comments (skin integrity, edema, etc.): 5L for transfer to chair, 6L for mobility. Pt returned to 4L once up in chair and resting      Pertinent Vitals/Pain Pain Assessment: No/denies pain    Home Living                      Prior Function  PT Goals (current goals can now be found in the care plan section) Progress towards PT goals: Progressing toward goals    Frequency    Min 2X/week      PT Plan Current plan remains appropriate    Co-evaluation              AM-PAC PT "6 Clicks" Mobility   Outcome Measure  Help needed turning from your back to your side while in a flat bed without using bedrails?: None Help needed moving from lying on your back to sitting on the side of a flat bed without using bedrails?: None Help needed moving to and from a bed to a chair (including a wheelchair)?: A Little Help needed standing up from a chair using your arms (e.g., wheelchair or bedside  chair)?: A Little Help needed to walk in hospital room?: A Little Help needed climbing 3-5 steps with a railing? : A Little 6 Click Score: 20    End of Session Equipment Utilized During Treatment: Oxygen;Gait belt(4-6L) Activity Tolerance: Patient tolerated treatment well Patient left: in chair;with call bell/phone within reach;with chair alarm set Nurse Communication: Mobility status PT Visit Diagnosis: Other abnormalities of gait and mobility (R26.89);Unsteadiness on feet (R26.81)     Time: 6979-4801 PT Time Calculation (min) (ACUTE ONLY): 37 min  Charges:  $Therapeutic Exercise: 23-37 mins                    Olga Coaster PT, DPT 11:02 AM,05/04/19

## 2019-05-04 NOTE — TOC Progression Note (Signed)
Transition of Care [facility]) - Initial/Assessment Note    Patient Details  Name: [name] [name] MRN: [id number] Date of Birth: 12/30/1960  Transition of Care [facility]) CM/SW Contact:    Su Hilt, RN Phone Number: 05/04/2019, 9:32 AM  Clinical Narrative:                  Met with the patient and discussed needs for DC, She will need to go home via EMS, the Home oxygen was delivered and her mother took it to her home, she has help at home with her mom and her daughter. NO additional needs       Patient Goals and CMS Choice        Expected Discharge Plan and Services                           DME Arranged: Oxygen DME Agency: [employer] Date DME Agency Contacted: 05/02/19 Time DME Agency Contacted: [phone] Representative spoke with at DME Agency: Leroy Sea            Prior Living Arrangements/Services                       Activities of Daily Living Home Assistive Devices/Equipment: None ADL Screening (condition at time of admission) Patient's cognitive ability adequate to safely complete daily activities?: Yes Is the patient deaf or have difficulty hearing?: No Does the patient have difficulty seeing, even when wearing glasses/contacts?: No Does the patient have difficulty concentrating, remembering, or making decisions?: No Patient able to express need for assistance with ADLs?: Yes Does the patient have difficulty dressing or bathing?: No Independently performs ADLs?: Yes (appropriate for developmental age) Does the patient have difficulty walking or climbing stairs?: Yes Weakness of Legs: Both Weakness of Arms/Hands: Both  Permission Sought/Granted                  Emotional Assessment              Admission diagnosis:  Respiratory failure ([place]) [J96.90] Hypoxia [R09.02] Acute respiratory disease due to COVID-19 virus [U07.1, J06.9] COVID-19 [U07.1] Patient Active Problem List   Diagnosis Date Noted  . Acute respiratory disease due to  COVID-19 virus 04/06/2019  . Type II diabetes mellitus with renal manifestations ([place]) 04/06/2019  . Sepsis ([place]) 04/06/2019  . CKD (chronic kidney disease), stage III 04/06/2019  . HLD (hyperlipidemia) 04/06/2019  . COVID-19 03/27/2019  . Pneumonia due to COVID-19 virus 03/27/2019  . Abdominal pain 02/12/2019  . AKI (acute kidney injury) ([place]) 02/12/2019  . Cellulitis, abdominal wall 02/04/2019  . Elevated C-reactive protein (CRP) 12/15/2018  . Elevated sed rate 12/15/2018  . Pharmacologic therapy 11/06/2018  . Disorder of skeletal system 11/06/2018  . Problems influencing health status 11/06/2018  . Decubitus ulcer of heel, bilateral 02/21/2018  . Diabetic ulcer of toe of right foot associated with type 2 diabetes mellitus ([place]) 02/14/2018  . Ulcer of left heel and midfoot with fat layer exposed ([place]) 02/14/2018  . MRSA (methicillin resistant Staphylococcus aureus) infection 11/17/2017  . Cellulitis 11/15/2017  . Perineal abscess 11/15/2017  . Subacute vulvitis 11/01/2017  . Chronic cystitis 03/01/2017  . Chronic pain syndrome 03/31/2016  . Insomnia secondary to chronic pain 03/31/2016  . Chronic upper back pain 12/25/2015  . Chronic hand pain (Bilateral) (L>R) 12/25/2015  . Rheumatoid arthritis ([place]) 12/25/2015  . Osteoarthritis, multiple sites 12/24/2015  . Chronic foot pain (Primary Area of Pain) (  Bilateral) (L>R) 12/24/2015  . Chronic elbow pain (Third area of Pain) (Bilateral) (L>R) 12/24/2015  . Chronic shoulder pain (Bilateral) (L>R) 12/24/2015  . Chronic neck pain (Bilateral) (R>L) 12/24/2015  . 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[employer]) 12/23/2015  . Chronic knee pain (Bilateral) (R>L) 12/23/2015  . Long term current use of opiate analgesic 12/23/2015  . Long term prescription opiate use 12/23/2015  . Opiate use (30 MME/Day) 12/23/2015  . Neurogenic pain 12/23/2015  . Neuropathic pain 12/23/2015  . Diabetic peripheral neuropathy ([place]) 12/23/2015   . Encounter for therapeutic drug level monitoring 12/23/2015  . Encounter for pain management planning 12/23/2015  . GERD (gastroesophageal reflux disease) 11/25/2015  . Neuropathy 11/25/2015  . Diarrhea 10/22/2015  . Hypokalemia 10/21/2015  . Hypomagnesemia 10/21/2015  . QT prolongation 10/21/2015  . Osteomyelitis due to type 2 diabetes mellitus ([place]) 10/21/2015  . Chronic wrist pain (Secondary area of Pain) (Bilateral) (L>R) 03/19/2015  . 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 Female genuine stress incontinence 02/14/2015  . Urge incontinence of urine 02/14/2015  . Obstructive apnea 07/03/2014  . Chronic diastolic CHF (congestive heart failure) ([place]) 07/03/2014  . Anxiety 01/03/2014  . Diabetic ulcer of heel ([place]) 01/03/2014  . COPD (chronic obstructive pulmonary disease) ([place]) 01/03/2014  . Bipolar disorder, unspecified ([place]) 01/03/2014  . Diastolic dysfunction 94/80/1655  . Combined fat and carbohydrate induced hyperlipemia 01/03/2014  . Shortness of breath 01/03/2014  . Acute on chronic congestive heart failure ([place]) 01/03/2014  . Incomplete bladder emptying 11/02/2012  . Bladder retention 10/10/2012  . Detrusor muscle hypertonia 10/04/2012  . Obstruction of urinary tract 10/04/2012  . FOM (frequency of micturition) 10/04/2012  . Mixed incontinence 10/04/2012  . Vitamin D insufficiency 04/15/2012  . Borderline personality disorder ([place]) 01/06/2012  . Mixed anxiety depressive disorder 01/06/2012  . History of laparoscopic adjustable gastric banding, 03/20/2007.  Removed 09/19/2011. 08/04/2011  . Nausea & vomiting 08/04/2011  . Hypothyroidism 06/28/2010  . Rheumatoid arthritis 06/28/2010  . Obesity 03/29/2005  . Essential (primary) hypertension 01/27/2005  . Major depressive disorder, recurrent episode, moderate ([place]) 06/03/2004   PCP:  [facility], [place] [facility]:   [place], [place] - [address] [place]  [address] Phone: [phone] Fax: [phone]     Social Determinants of Health (SDOH) Interventions    Readmission Risk Interventions Readmission Risk Prevention Plan 05/03/2019  Transportation Screening Complete  Medication Review (RN Care Manager) Referral to Pharmacy  PCP or Specialist appointment within 3-5 days of discharge Complete  HRI or [place] Not Applicable  Some recent data might be hidden

## 2019-05-04 NOTE — Progress Notes (Signed)
Patient discharged home per MD order. All discharge instructions given and all questions answered. EMS called for transport. 

## 2019-05-04 NOTE — Progress Notes (Signed)
New referral for [employer] community Palliative program received from Palliative NP [employer]. TOC Deliliah Earl Lites made aware. Patient information given to referral. Plan is for d/c today. Dayna Barker [facility], [facility] Liaison [facility] [phone]

## 2019-05-04 NOTE — TOC Progression Note (Signed)
Transition of Care [facility]) - Progression Note    Patient Details  Name: [name] [name] MRN: [id number] Date of Birth: 1961/02/09  Transition [facility] Care [facility]) CM/SW Contact  Barrie Dunker, RN Phone Number: 05/04/2019, 8:26 AM  Clinical Narrative:    [facility], [facility], [facility]ana [facility] and [facility] all have not accepted the patient for Home health services.         Expected Discharge Plan and Services                           DME Arranged: Oxygen DME Agency: [employer] Date DME Agency Contacted: 05/02/19 Time DME Agency Contacted: [phone] Representative spoke with at DME Agency: Nida Boatman             Social Determinants of Health (SDOH) Interventions    Readmission Risk Interventions Readmission Risk Prevention Plan 05/03/2019  Transportation Screening Complete  Medication Review [employer]) Referral to Pharmacy  PCP or Specialist appointment within 3-5 days of discharge Complete  HRI or Home Care Consult Complete  Palliative Care Screening Not Applicable  Skilled Nursing Facility Not Applicable  Some recent data might be hidden

## 2019-05-04 NOTE — TOC Progression Note (Signed)
Transition of Care [facility]) - Progression Note    Patient Details  Name: [name] [name] MRN: [id number] Date of Birth: 11-28-1960  Transition of Care [facility]) CM/SW Contact  Barrie Dunker, RN Phone Number: 05/04/2019, 8:28 AM  Clinical Narrative:     Nicolette Bang with [employer] a message reminding him that the patient will need Oxygen for home delivered with anticipated DC today       Expected Discharge Plan and Services                           DME Arranged: Oxygen DME Agency: [employer] Date DME Agency Contacted: 05/02/19 Time DME Agency Contacted: [phone] Representative spoke with at DME Agency: Nida Boatman             Social Determinants of Health (SDOH) Interventions    Readmission Risk Interventions Readmission Risk Prevention Plan 05/03/2019  Transportation Screening Complete  Medication Review (RN Care Manager) Referral to Pharmacy  PCP or Specialist appointment within 3-5 days of discharge Complete  HRI or Home Care Consult Complete  Palliative Care Screening Not Applicable  Skilled Nursing Facility Not Applicable  Some recent data might be hidden

## 2019-05-05 DIAGNOSIS — G2581 Restless legs syndrome: Principal | ICD-10-CM

## 2019-05-05 DIAGNOSIS — R252 Cramp and spasm: Principal | ICD-10-CM

## 2019-05-05 NOTE — Discharge Summary (Addendum)
[facility] Discharge Summary   Patient: [name] [name] QIH:[id number]  PCP: [facility]  Date of admission: 04/06/2019   Date of discharge: 05/04/2019      Discharge Diagnoses:  Principal Problem:   Acute respiratory disease due to COVID-19 virus Active Problems:   Chronic diastolic CHF (congestive heart failure) (HCC)   Rheumatoid arthritis   Type II diabetes mellitus with renal manifestations (HCC)   SIRS, sepsis ruled out (HCC)   CKD (chronic kidney disease), stage III   HLD (hyperlipidemia)  Admitted From: Home Disposition:  Home   Recommendations for Outpatient Follow-up:  1. PCP: Follow-up with PCP in 1 week. 2. Establish care with pulmonary and follow-up 3. Follow up LABS/TEST: None  Follow-up Information    [facility]. Go on 05/09/2019.   Specialty: Family Medicine Why: at 10:40 am Contact information: [address] [place] [address] [phone]        Erin Fulling, MD. Go on 06/04/2019.   Specialties: Pulmonary Disease, Cardiology Why: at 10:30am Contact information: [address] [place] [address] [phone]          Diet recommendation: Cardiac diet  Activity: The patient is advised to gradually reintroduce usual activities, as tolerated  Discharge Condition: stable  Code Status: Full code   History of present illness: As per the H and P dictated on admission, "[name] [name] is a 59 y.o. female with medical history significant of hypertension, hyperlipidemia, diabetes mellitus, GERD, hypothyroidism, depression, anxiety, OSA, rheumatoid arthritis, dCHF, C. difficile colitis, hernia, CKD stage III, who presents with shortness of breath.  Patient was recently hospitalized from 12/8-12/12 due to Covid 19 infection. Patient was treated with remdesivir and steroid. Patient states that have she did not need to wear any oxygen at home, but over the past few days she has progressively worsening shortness of  breath with cough, some chest pain. Also had fever of 103 and chills. He has some mild diarrhea and nausea, no vomiting or abdominal pain. No symptoms of UTI or unilateral weakness.EMS state room air saturation around 70% upon their arrival and needs nonrebreather. "  Hospital Course:  Summary of her active problems in the hospital is as following.   Acute hypoxic respiratory failure secondary to ARDS with recent COVID-19 infection, stable Acute on chronic HFpEF -Oxygen improving after diuresis. Not on home oxygen prior to arrival  aggressive bronchodilators-Alvesco, incentive spirometer, flutter valve Currently on Decadron taper.  Continue for now May require as needed Lasix at the time of the discharge.  Generalized weakness -Secondary to recent critical illness and deconditioning -PT/OT-home health (ordered).  Home oxygen ordered.  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   Patient was seen by physical therapy, who recommended Home health, which was arranged. On the day of the discharge the patient's vitals were stable, and no other acute medical condition were reported by patient. the patient was felt safe to be discharge at Home with Home health.  Consultants: Pulmonary Procedures: None  Discharge Exam: General: Appear in mild distress, no Rash; Oral Mucosa Clear, moist. Cardiovascular: S1 and S2 Present, no Murmur, Respiratory: normal respiratory effort, Bilateral Air entry present and bilateral  Crackles, no wheezes Abdomen: Bowel Sound present,  Soft and no tenderness, no hernia Extremities: no Pedal edema, no calf tenderness Neurology: alert and oriented to time,  place, and person affect appropriate.  Filed Weights   04/30/19 1600 05/03/19 0736 05/04/19 0414  Weight: 130 kg 134.4 kg 135.2 kg   Vitals:   05/04/19 0747 05/04/19 1341  BP: (!) 134/91   Pulse: 74   Resp: 17   Temp: 97.7 F (36.5 C)   SpO2: 95% 95%    DISCHARGE MEDICATION: Allergies as of 05/04/2019      Reactions   Cephalexin Hives   Codeine Palpitations, Nausea Only, Nausea And Vomiting, Rash, Shortness Of Breath   "makes heart fly, she gets flushed and passes out"   Doxycycline Rash   Propoxyphene Rash, Shortness Of Breath   Increase heart rate   Sulfa Antibiotics Palpitations, Nausea Only, Shortness Of Breath, Hives   "makes heart fly, she gets flushed and passes out"   Lovenox [enoxaparin Sodium] Hives   Hydrocodone Nausea And Vomiting   Hear racing & breaks out into a cold sweat.   Meropenem Rash   Erythematous, hot, pruritic rash over arms, chest, back, abdomen, and face occurred at the end of meropenem infusion on 02/22/18      Medication List    STOP taking these medications   methotrexate 50 MG/2ML injection     TAKE these medications   ALPRAZolam 0.5 MG tablet Commonly known as: XANAX Take 1 tablet (0.5 mg total) by mouth 2 (two) times daily as needed for anxiety.   Alvesco 160 MCG/ACT inhaler Generic drug: ciclesonide Inhale 4 puffs into the lungs 2 (two) times daily.   ARIPiprazole 5 MG tablet Commonly known as: ABILIFY TAKE 1 TABLET BY MOUTH ONCE DAILY.   ascorbic acid 500 MG tablet Commonly known as: VITAMIN C Take 1 tablet (500 mg total) by mouth daily.   atorvastatin 80 MG tablet Commonly known as: LIPITOR Take 80 mg by mouth daily at 6 PM.   busPIRone 10 MG tablet Commonly known as: BUSPAR TAKE 1 TABLET BY MOUTH TWICE DAILY   Calcium Carb-Ergocalciferol 250-125 MG-UNIT Tabs Take 1 tablet by mouth daily.   dexamethasone 1 MG tablet Commonly known as: DECADRON Take 2 mg daily for 3days,Take 1mg  daily for 3 days, Take 1mg  every  other day for 3days,then stop   Ensure Max Protein Liqd Take 330 mLs (11 oz total) by mouth 2 (two) times daily between meals.   famotidine 20 MG tablet Commonly known as: PEPCID Take 1 tablet (20 mg total) by mouth daily.   FLUoxetine 20 MG capsule Commonly known as: PROZAC TAKE (1) CAPSULE BY MOUTH ONCE DAILY. What changed:   how much to take  how to take this  when to take this   folic acid 1 MG tablet Commonly known as: FOLVITE Take 1 mg by mouth daily.   furosemide 20 MG tablet Commonly known as: Lasix Take 1 tablet (20 mg total) by mouth daily as needed for fluid or edema (weight gain >3 lbs in 1 day or >5 lbs in 2 days).   ipratropium-albuterol 0.5-2.5 (3) MG/3ML Soln Commonly known as: DUONEB Take 3 mLs by nebulization 3 (three) times daily.   levothyroxine 88 MCG tablet Commonly known as: SYNTHROID Take 88 mcg by mouth daily before breakfast.   linagliptin 5 MG Tabs tablet Commonly known as: TRADJENTA Take 1 tablet (5 mg total) by mouth daily.   lisinopril 2.5 MG tablet Commonly known as: ZESTRIL Take 2.5 mg by mouth daily.  metFORMIN 500 MG tablet Commonly known as: GLUCOPHAGE Take 500 mg by mouth 2 (two) times daily with a meal.   oxyCODONE 5 MG immediate release tablet Commonly known as: Oxy IR/ROXICODONE Take 1 tablet (5 mg total) by mouth every 6 (six) hours as needed for severe pain. Must last 30 days   oxyCODONE 5 MG immediate release tablet Commonly known as: Oxy IR/ROXICODONE Take 1 tablet (5 mg total) by mouth every 6 (six) hours as needed for severe pain. Must last 30 days   polyethylene glycol 17 g packet Commonly known as: MIRALAX / GLYCOLAX Take 17 g by mouth daily as needed for mild constipation.   pramipexole 0.125 MG tablet Commonly known as: MIRAPEX Take 0.125 mg by mouth 2 (two) times daily.   pregabalin 150 MG capsule Commonly known as: LYRICA Take 1 capsule (150 mg total) by mouth 3 (three) times daily.   QUEtiapine  300 MG tablet Commonly known as: SEROQUEL TAKE ONE TABLET BY MOUTH AT BEDTIME. What changed:   how much to take  how to take this  when to take this   [place] FlexTouch 200 UNIT/ML Sopn Generic drug: Insulin Degludec Inject 65-100 Units into the skin daily.   VESIcare 5 MG tablet Generic drug: solifenacin Take 5 mg by mouth daily.   Vitamin D3 125 MCG (5000 UT) Caps Take 1 capsule (5,000 Units total) by mouth daily with breakfast. Take along with calcium and magnesium.   zinc sulfate 220 (50 Zn) MG capsule Take 1 capsule (220 mg total) by mouth daily.   zolpidem 5 MG tablet Commonly known as: AMBIEN TAKE 1 TABLET BY MOUTH EVERYDAY AT BEDTIME What changed:   how much to take  how to take this  when to take this      Allergies  Allergen Reactions  . Cephalexin Hives  . Codeine Palpitations, Nausea Only, Nausea And Vomiting, Rash and Shortness Of Breath    "makes heart fly, she gets flushed and passes out"  . Doxycycline Rash  . Propoxyphene Rash and Shortness Of Breath    Increase heart rate  . Sulfa Antibiotics Palpitations, Nausea Only, Shortness Of Breath and Hives    "makes heart fly, she gets flushed and passes out"  . Lovenox [Enoxaparin Sodium] Hives  . Hydrocodone Nausea And Vomiting    Hear racing & breaks out into a cold sweat.  . Meropenem Rash    Erythematous, hot, pruritic rash over arms, chest, back, abdomen, and face occurred at the end of meropenem infusion on 02/22/18   Discharge Instructions    Ambulatory referral to Pulmonology   Complete by: As directed    Diet - low sodium heart healthy   Complete by: As directed    Discharge instructions   Complete by: As directed    It is important that you read the given instructions as well as go over your medication list with RN to help you understand your care after this hospitalization.  Please follow-up with PCP in 1-2 weeks.  Please note that NO REFILLS for any discharge medications will be  authorized once you are discharged, as it is imperative that you return to your primary care physician (or establish a relationship with a primary care physician if you do not have one) for your aftercare needs so that they can reassess your need for medications and monitor your lab values.  Please request your primary care physician to go over all Hospital Tests and Procedure/Radiological results at the follow up. Please  get all Hospital records sent to your PCP by signing hospital release before you go home.  Do not take more than prescribed Pain, Sleep and Anxiety Medications.  You were cared for by a hospitalist during your hospital stay. If you have any questions about your discharge medications or the care you received while you were in the hospital after you are discharged, you can call the unit @ you were admitted to and ask to speak with the hospitalist Lynden Oxford. Ask for Hospitalist on call if the hospitalist that took care of you is not available.   Once you are discharged, your primary care physician will handle any further medical issues.  You Must read complete instructions/literature along with all the possible adverse reactions/side effects for all the Medicines you take and that have been prescribed to you. Take any new Medicines after you have completely understood and accept all the possible adverse reactions/side effects.  If you have smoked or chewed Tobacco in the last 2 yrs please STOP smoking STOP any Recreational drug use.  If you drink alcohol, please safely STOP the use. Do not drive, operating heavy machinery, perform activities at heights, swimming or participation in water activities or provide baby sitting services under influence.  Wear Seat belts while driving.   Increase activity slowly   Complete by: As directed       The results of significant diagnostics from this hospitalization (including imaging, microbiology, ancillary and laboratory) are listed  below for reference.    Significant Diagnostic Studies: CT Angio Chest PE W and/or Wo Contrast  Result Date: 04/06/2019 CLINICAL DATA:  COVID-19 positive with hypoxemia/shortness of breath EXAM: CT ANGIOGRAPHY CHEST WITH CONTRAST TECHNIQUE: Multidetector CT imaging of the chest was performed using the standard protocol during bolus administration of intravenous contrast. Multiplanar CT image reconstructions and MIPs were obtained to evaluate the vascular anatomy. CONTRAST:  75mL OMNIPAQUE IOHEXOL 350 MG/ML SOLN COMPARISON:  Chest CT July 07, 2012; chest radiograph April 06, 2019. FINDINGS: Cardiovascular: There is no demonstrable pulmonary embolus. There is no thoracic aortic aneurysm or dissection. The visualized great vessels appear unremarkable. There is minimal pericardial fluid, felt to be within physiologic range. There is no pericardial thickening. The main pulmonary outflow tract measures 3.6 cm in diameter, prominent. Mediastinum/Nodes: Thyroid appears unremarkable. There are multiple subcentimeter mediastinal lymph nodes. There is a lymph node to the right of the distal trachea measuring 1.2 x 1.1 cm. There is a lymph node along the superior aortopulmonary window region measuring 1.3 x 1.0 cm. A lymph node to the right of the upper carina measures 1.3 x 1.2 cm. There is a subcarinal lymph node measuring 1.5 x 1.4 cm. There is a retrocrural lymph node on the left at the gastroesophageal junction level measuring 1.2 x 1.2 cm. No esophageal lesions are appreciable. Lungs/Pleura: There is widespread airspace opacity consistent with multifocal pneumonia. Most of this infiltrate has a ground-glass type appearance, although there are scattered areas of consolidation, particularly in the superior, lateral, and posterior segments of the right lower lobe. There is no appreciable pleural effusion. Upper Abdomen: Spleen measures 16.1 x 14.8 x 9.5 cm with a measured splenic volume 1,132 mL. Gallbladder is  absent. Visualized upper abdominal structures appear unremarkable. Musculoskeletal: There are foci of degenerative change in the thoracic spine. There are no blastic or lytic bone lesions. No chest wall lesions evident. Review of the MIP images confirms the above findings. IMPRESSION: 1. No demonstrable pulmonary embolus. No thoracic aortic aneurysm or  dissection. 2. Widespread airspace opacity consistent with multifocal pneumonia bilaterally. Areas of consolidation noted in the right lower lobe. Other areas have a more ground-glass type appearance. Suspect much of this infiltrate is due to atypical organism etiology. 3. Areas of adenopathy, likely secondary to the extensive pneumonitis. 4. Prominence of the main pulmonary outflow tract, a finding indicative of pulmonary arterial hypertension. 5.  Splenomegaly. 6.  Gallbladder absent. Electronically Signed   By: Bretta Bang III M.D.   On: 04/06/2019 13:05   DG Chest Port 1 View  Result Date: 05/03/2019 CLINICAL DATA:  Shortness of breath. Positive for Covid-19 on 03/23/2019. Sepsis. Acute respiratory disease. EXAM: PORTABLE CHEST 1 VIEW COMPARISON:  04/20/2019 FINDINGS: Shallow lung inflation. Patchy parenchymal opacities are identified throughout the lungs bilaterally, similar in appearance to prior studies. IMPRESSION: Similar dense opacities in the lungs. Electronically Signed   By: Norva Pavlov M.D.   On: 05/03/2019 09:49   DG Chest Port 1 View  Result Date: 04/20/2019 CLINICAL DATA:  COVID-19 positive. EXAM: PORTABLE CHEST 1 VIEW COMPARISON:  April 11, 2019. FINDINGS: Stable cardiomediastinal silhouette. Stable bilateral multiple opacities are noted throughout both lungs. No pneumothorax or pleural effusion is noted. Bony thorax is unremarkable. IMPRESSION: Stable bilateral multiple lung opacities are noted concerning for multifocal pneumonia. Electronically Signed   By: Lupita Raider M.D.   On: 04/20/2019 13:04   DG Chest Port 1  View  Result Date: 04/11/2019 CLINICAL DATA:  Diagnosed with COVID-19 on 03/23/2019, previously hospitalized, respiratory failure, history diabetes mellitus, CHF, hypertension, rheumatoid arthritis EXAM: PORTABLE CHEST 1 VIEW COMPARISON:  Portable exam 0801 hours compared to 04/10/2019 FINDINGS: Enlargement of cardiac silhouette again seen. Stable mediastinal contours. Persistent patchy BILATERAL pulmonary infiltrates consistent with multifocal pneumonia. No pleural effusion or pneumothorax. IMPRESSION: Persistent patchy BILATERAL pulmonary infiltrates consistent with multifocal pneumonia, not significantly changed. Electronically Signed   By: Ulyses Southward M.D.   On: 04/11/2019 08:16   DG Chest Port 1 View  Result Date: 04/10/2019 CLINICAL DATA:  Acute respiratory failure EXAM: PORTABLE CHEST 1 VIEW COMPARISON:  04/08/2019 FINDINGS: Near diffuse bilateral airspace opacities, slightly worsened from the prior study. Moderate cardiomegaly. Small pleural effusions. IMPRESSION: Slight worsening of diffuse bilateral airspace opacities, likely indicating worsening multifocal pneumonia or alveolar edema. Electronically Signed   By: Deatra Robinson M.D.   On: 04/10/2019 03:29   DG Chest Port 1 View  Result Date: 04/08/2019 CLINICAL DATA:  Acute respiratory failure EXAM: PORTABLE CHEST 1 VIEW COMPARISON:  April 07, 2019 at 4:46 a.m. FINDINGS: Again noted are diffuse bilateral ground-glass airspace opacities, similar to prior study. There is no pneumothorax. There may be small bilateral pleural effusions. Heart size remains enlarged. There is no acute osseous abnormality. IMPRESSION: Persistent diffuse bilateral pulmonary opacities without significant interval change. Electronically Signed   By: Katherine Mantle M.D.   On: 04/08/2019 02:54   DG Chest Port 1 View  Result Date: 04/07/2019 CLINICAL DATA:  Recently discharged with COVID-19 pneumonia. Shortness of breath. EXAM: PORTABLE CHEST 1 VIEW COMPARISON:   April 06, 2019 FINDINGS: Diffuse bilateral pulmonary infiltrates are similar in the interval. Stable cardiomegaly. The hila and mediastinum are unchanged. No pneumothorax. IMPRESSION: No interval change in the bilateral pulmonary infiltrates. Recommend follow-up to resolution. Electronically Signed   By: Gerome Sam III M.D   On: 04/07/2019 15:17   DG Chest Port 1 View  Result Date: 04/06/2019 CLINICAL DATA:  Pt to ED by EMS from home. Pt dx with COVID 3 weeks  ago and treated at [facility] and released last Saturday. Pt continues to be SOB. Pt place on non re-breather by EMS. Hx of HTN and diabetes. Former smoker. fever, recent covid, hypoxic EXAM: PORTABLE CHEST 1 VIEW COMPARISON:  Radiograph 03/26/2019 FINDINGS: Stable enlarged cardiac silhouette. There is increased patchy airspace disease in LEFT and RIGHT lung. Airspace disease is most dense in the lateral RIGHT lung. Bibasilar atelectasis. No pneumothorax IMPRESSION: Increasing bilateral patchy airspace disease consistent with viral pneumonia. Electronically Signed   By: Genevive Bi M.D.   On: 04/06/2019 10:31   ECHOCARDIOGRAM COMPLETE  Result Date: 04/10/2019   ECHOCARDIOGRAM REPORT   Patient Name:   AVALINA BENKO Date of Exam: 04/10/2019 Medical Rec #:  [id number]       Height:       66.0 in Accession #:    [id number]      Weight:       320.0 lb Date of Birth:  1960-07-01       BSA:          2.44 m Patient Age:    58 years        BP:           104/75 mmHg Patient Gender: F               HR:           82 bpm. Exam Location:  [facility] Procedure: 2D Echo, Cardiac Doppler and Color Doppler Indications:     Acute respiratory insufficiency 518.82  History:         Patient has no prior history of Echocardiogram examinations.                  Risk Factors:Hypertension, Diabetes and Sleep Apnea.  Sonographer:     Cristela Blue RDCS (AE) Referring Phys:  [id number] Eugenie Norrie Diagnosing Phys: Harold Hedge MD IMPRESSIONS  1. Left ventricular ejection  fraction, by visual estimation, is 70 to 75%. The left ventricle has hyperdynamic function. Left ventricular septal wall thickness was mildly increased. Mildly increased left ventricular posterior wall thickness. There is mildly increased left ventricular hypertrophy.  2. Left ventricular diastolic parameters are consistent with Grade I diastolic dysfunction (impaired relaxation).  3. The left ventricle has no regional wall motion abnormalities.  4. Global right ventricle has normal systolic function.The right ventricular size is normal. No increase in right ventricular wall thickness.  5. Left atrial size was normal.  6. Right atrial size was normal.  7. The mitral valve was not well visualized. Trivial mitral valve regurgitation.  8. The tricuspid valve is not well visualized. Tricuspid valve regurgitation is mild.  9. The aortic valve was not well visualized. Aortic valve regurgitation is trivial. 10. The pulmonic valve was not well visualized. Pulmonic valve regurgitation is trivial. 11. The aortic root was not well visualized. 12. Normal pulmonary artery systolic pressure. 13. The atrial septum is grossly normal. FINDINGS  Left Ventricle: Left ventricular ejection fraction, by visual estimation, is 70 to 75%. The left ventricle has hyperdynamic function. The left ventricle has no regional wall motion abnormalities. Mildly increased left ventricular posterior wall thickness. There is mildly increased left ventricular hypertrophy. Left ventricular diastolic parameters are consistent with Grade I diastolic dysfunction (impaired relaxation). Right Ventricle: The right ventricular size is normal. No increase in right ventricular wall thickness. Global RV systolic function is has normal systolic function. The tricuspid regurgitant velocity is 1.93 m/s, and with an assumed right atrial pressure  of 10 mmHg, the estimated right ventricular systolic pressure is normal at 24.9 mmHg. Left Atrium: Left atrial size was  normal in size. Right Atrium: Right atrial size was normal in size Pericardium: There is no evidence of pericardial effusion. Mitral Valve: The mitral valve was not well visualized. Trivial mitral valve regurgitation. Tricuspid Valve: The tricuspid valve is not well visualized. Tricuspid valve regurgitation is mild. Aortic Valve: The aortic valve was not well visualized. Aortic valve regurgitation is trivial. Aortic valve mean gradient measures 5.5 mmHg. Aortic valve peak gradient measures 10.3 mmHg. Pulmonic Valve: The pulmonic valve was not well visualized. Pulmonic valve regurgitation is trivial. Pulmonic regurgitation is trivial. Aorta: The aortic root was not well visualized. IAS/Shunts: The atrial septum is grossly normal.  LEFT VENTRICLE PLAX 2D LV IVS:        0.72 cm Diastology                        LV e' lateral:   5.44 cm/s                        LV E/e' lateral: 14.4                        LV e' medial:    6.85 cm/s                        LV E/e' medial:  11.5  RIGHT VENTRICLE RV Basal diam:  3.77 cm RV S prime:     9.79 cm/s TAPSE (M-mode): 4.3 cm LEFT ATRIUM           Index LA Vol (A2C): 47.3 ml 19.37 ml/m  AORTIC VALVE                    PULMONIC VALVE AV Vmax:           160.50 cm/s  PV Vmax:        0.69 m/s AV Vmean:          102.500 cm/s PV Peak grad:   1.9 mmHg AV VTI:            0.270 m      RVOT Peak grad: 4 mmHg AV Peak Grad:      10.3 mmHg AV Mean Grad:      5.5 mmHg LVOT Vmax:         117.00 cm/s LVOT Vmean:        91.100 cm/s LVOT VTI:          0.275 m LVOT/AV VTI ratio: 1.02 MITRAL VALVE                        TRICUSPID VALVE MV Area (PHT): 2.76 cm             TR Peak grad:   14.9 mmHg MV PHT:        79.75 msec           TR Vmax:        193.00 cm/s MV Decel Time: 275 msec MV E velocity: 78.60 cm/s 103 cm/s  SHUNTS MV A velocity: 85.40 cm/s 70.3 cm/s Systemic VTI: 0.28 m MV E/A ratio:  0.92       1.5  Harold Hedge MD Electronically signed by Harold Hedge MD Signature Date/Time:  04/10/2019/11:23:21 AM    Final  Microbiology: No results found for this or any previous visit (from the past 240 hour(s)).   Labs: CBC: Recent Labs  Lab 04/30/19 0855 05/01/19 0431 05/02/19 0622 05/03/19 0330 05/04/19 0609  WBC 5.6 4.8 5.5 5.3 5.5  HGB 10.2* 9.2* 9.9* 9.2* 9.6*  HCT 33.9* 30.5* 32.8* 30.0* 31.1*  MCV 85.8 85.7 85.9 84.5 84.1  PLT 159 151 174 164 171   Basic Metabolic Panel: Recent Labs  Lab 04/30/19 0855 05/01/19 0431 05/02/19 0622 05/03/19 0330 05/04/19 0609  NA 140 144 141 139 139  K 3.2* 3.9 3.9 3.9 3.9  CL 109 113* 109 108 107  CO2 GLUCOSE 142* 143* 125* 196* 135*  BUN 26* 24* 27* 28* 30*  CREATININE 0.70 0.64 0.75 0.53 0.76  CALCIUM 8.4* 8.6* 8.8* 8.6* 8.6*  MG 1.7 2.1 2.0 1.8 1.8   Liver Function Tests: Recent Labs  Lab 04/30/19 0855 05/01/19 0431 05/02/19 0622 05/03/19 0330  AST 12* 11* 11* 10*  ALT ALKPHOS 42 36* 38 37*  BILITOT 0.5 0.4 0.4 0.7  PROT 6.5 5.9* 6.3* 6.0*  ALBUMIN 2.5* 2.3* 2.5* 2.5*   No results for input(s): LIPASE, AMYLASE in the last 168 hours. No results for input(s): AMMONIA in the last 168 hours. Cardiac Enzymes: No results for input(s): CKTOTAL, CKMB, CKMBINDEX, TROPONINI in the last 168 hours. BNP (last 3 results) Recent Labs    04/07/19 0613 04/11/19 0326 04/29/19 0559  BNP 21.0 21.0 39.0   CBG: Recent Labs  Lab 05/03/19 1143 05/03/19 1648 05/03/19 2047 05/04/19 0746 05/04/19 1147  GLUCAP 152* 208* 296* 114* 156*    Time spent: 35 minutes  Signed:  Lynden Oxford  [facility] 05/04/2019 9:22 PM

## 2019-05-07 NOTE — Unmapped (Signed)
P/C requesting refill on Mirapex , last ordered on 02/01/2019, last visit 02/26/2019, phone visit 05/09/19 hospital F/U

## 2019-05-08 NOTE — Unmapped (Signed)
Per Dr. Eben Burow msg sched  Monday 1/25 at 3 PM?  okay w/video visit. If she cannot do video, in-person preferred over phone visit.

## 2019-05-09 ENCOUNTER — Institutional Professional Consult (permissible substitution): Admit: 2019-05-09 | Discharge: 2019-05-10 | Payer: MEDICAID | Attending: Family | Primary: Family

## 2019-05-09 DIAGNOSIS — M069 Rheumatoid arthritis, unspecified: Principal | ICD-10-CM

## 2019-05-09 DIAGNOSIS — M0579 Rheumatoid arthritis with rheumatoid factor of multiple sites without organ or systems involvement: Principal | ICD-10-CM

## 2019-05-09 DIAGNOSIS — L89302 Pressure ulcer of unspecified buttock, stage 2: Principal | ICD-10-CM

## 2019-05-09 DIAGNOSIS — Z794 Long term (current) use of insulin: Principal | ICD-10-CM

## 2019-05-09 DIAGNOSIS — J80 Acute respiratory distress syndrome: Principal | ICD-10-CM

## 2019-05-09 DIAGNOSIS — E1165 Type 2 diabetes mellitus with hyperglycemia: Principal | ICD-10-CM

## 2019-05-09 MED ORDER — INSULIN LISPRO (U-100) 100 UNIT/ML SUBCUTANEOUS SOLUTION
3 refills | 0 days | Status: CP
Start: 2019-05-09 — End: 2020-05-08

## 2019-05-09 NOTE — Unmapped (Signed)
[facility] Refill Coordination Note    Specialty Medication(s) to be Shipped:   Inflammatory Disorders: Actemra-faxed dr    Elnita Maxwell medication(s) to be shipped:       Jon Billings, DOB: [birth year]  Phone: [phone] (home)       All above HIPAA information was verified with patient.     Was a Nurse, learning disability used for this call? No    Completed refill call assessment today to schedule patient's medication shipment from the [facility] [phone]).       Specialty medication(s) and dose(s) confirmed: Regimen is correct and unchanged.   Changes to medications: Mahaley reports starting the following medications: alprazalom,albesco,dexamethasone,famotidine,furosemide,Albuterol solution,Linagliptin,Miralax,Zinc sulfate  Changes to insurance: No  Questions for the pharmacist: No    Confirmed patient received Welcome Packet with first shipment. The patient will receive a drug information handout for each medication shipped and additional FDA Medication Guides as required.       DISEASE/MEDICATION-SPECIFIC INFORMATION        For patients on injectable medications: Patient currently has 3 doses left.  Next injection is scheduled for 01/22,01/29,02/05.    SPECIALTY MEDICATION ADHERENCE     Medication Adherence    Patient reported X missed doses in the last month: 0  Specialty Medication: actemra 162mg /0.2ml  Informant: patient                  actemra 162/0.9 mg/ml: 21  days of medicine on hand       SHIPPING     Shipping address confirmed in Epic.     Delivery Scheduled: Yes, Expected medication delivery date: 02/03.     Medication will be delivered via Same Day Courier to the prescription address in Epic WAM.    Antonietta Barcelona   [facility] Specialty Technician

## 2019-05-09 NOTE — Unmapped (Signed)
Tocilizumab (Actemra) refill  Last ov:  05/04/2019  Next ov: Visit date not found     Labs:   AST   Date Value Ref Range Status   03/12/2019 32 14 - 38 U/L Final   12/06/2010 38 14 - 38 U/L Final     ALT   Date Value Ref Range Status   03/12/2019 24 <35 U/L Final   12/06/2010 31 15 - 48 U/L Final     Creatinine   Date Value Ref Range Status   03/12/2019 0.96 0.60 - 1.00 mg/dL Final   19/14/7829 5.62 (L) 0.60 - 1.00 MG/DL Final     WBC   Date Value Ref Range Status   03/12/2019 6.3 4.5 - 11.0 10*9/L Final   12/08/2010 10.2 4.5 - 11.0 x10 9th/L Final     HGB   Date Value Ref Range Status   03/12/2019 12.0 (L) 13.5 - 16.0 g/dL Final   13/11/6576 46.9 12.0 - 16.0 G/DL Final     HCT   Date Value Ref Range Status   03/12/2019 35.4 (L) 36.0 - 46.0 % Final   12/08/2010 37.3 36.0 - 46.0 % Final     MCV   Date Value Ref Range Status   03/12/2019 80.4 80.0 - 100.0 fL Final   12/08/2010 78 (L) 80 - 100 FL Final     RDW   Date Value Ref Range Status   03/12/2019 16.1 (H) 12.0 - 15.0 % Final   12/08/2010 18.3 (H) 12.0 - 15.0 % Final     Platelet   Date Value Ref Range Status   03/12/2019 181 150 - 440 10*9/L Final   12/08/2010 311 150 - 440 x10 9th/L Final     Neutrophils %   Date Value Ref Range Status   03/12/2019 78.8 % Final     Lymphocytes %   Date Value Ref Range Status   03/12/2019 17.3 % Final     Monocytes %   Date Value Ref Range Status   03/12/2019 2.5 % Final     Eosinophils %   Date Value Ref Range Status   03/12/2019 0.2 % Final     Basophils %   Date Value Ref Range Status   03/12/2019 0.5 % Final
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
## 2019-05-10 ENCOUNTER — Telehealth: Payer: Self-pay | Admitting: *Deleted

## 2019-05-10 NOTE — Telephone Encounter (Signed)
I have attempted to call several times for pre appointment review of allergies/meds. Line has been busy each with each attempt.

## 2019-05-10 NOTE — Progress Notes (Signed)
Unsuccessful attempt to contact patient for Virtual Visit (Pain Management Telehealth)   Patient provided contact information:  [phone] (home); [phone] (mobile); (Preferred) [phone] [email or web address]   Pre-screening:  Our staff was unsuccessful in contacting [name] [name] using the above provided information.   I unsuccessfully attempted to make contact with [name] [name] on 05/14/2019 via telephone. I was unable to complete the virtual encounter due to constant busy signal. I was unable to leave a message.  Pharmacotherapy Assessment  Analgesic: Oxycodone IR5 mg, 1 tab PO q 6hrs (20mg /dayof oxycodone) MME/day:30mg /day.   Follow-up plan:   Reschedule Visit.     Interventional therapies: Planned, scheduled, and/or pending:   Not at this time.   Considering:   Diagnostic/therapeutic IV lidocaine infusions.  Diagnostic bilateral lumbar sympathetic block  Diagnostic bilateral intra-articular shoulder joint injection  Diagnostic bilateral intra-articular knee injections with local anesthetic and steroid   Diagnostic right-sided CESI  Diagnostic bilateral Cervicalfacet block  Possible bilateral cervical facet RFA.    Palliative PRN treatment(s):   Palliative IV lidocaine infusion     Recent Visits Date Type Provider Dept  02/14/19 Office Visit 02/16/19, MD [facility]  Showing recent visits within past 90 days and meeting all other requirements   Today's Visits Date Type Provider Dept  05/14/19 Telemedicine 05/16/19, MD [facility]  Showing today's visits and meeting all other requirements   Future Appointments No visits were found meeting these conditions.  Showing future appointments within next 90 days and meeting all other requirements    Note by: Delano Metz, MD Date: 05/14/2019; Time: 3:12 PM

## 2019-05-11 ENCOUNTER — Telehealth: Payer: Self-pay

## 2019-05-11 NOTE — Telephone Encounter (Signed)
Attempted to call patient. Line busy.

## 2019-05-13 NOTE — Unmapped (Signed)
RHEUMATOLOGY CLINIC FOLLOW-UP NOTE    Primary Care Provider: Noralyn Pick, FNP    HPI:  Amanda Hanson is a 59 y.o.  female with a past medical history of hypothyroidism, obesity, heart failure, DM (A1C 13.4% 08/2018), chronic pain who is here for phone follow-up of seropositive (+RF, +anti-CCP), non-erosive rheumatoid arthritis.  I last did a telehealth visit with her 11/20/2018. At that time, she reported that she was in significant joint pain, also with pain in shoulders, wrists, and jaw, and had trouble walking. She had recently had osteomyelitis infections of her toes and underwent amputation, had been on and off methotrexate and Actemra due to these infections, plan was to continue prednisone 10 mg daily until wounds had healed. She had finished her antibiotics at the time of our telehealth visit. Had 2 hours of morning stiffness, was on Percocet with outside pain doctor. Voltaren gel was ineffective for her pain. She was restarted on methotrexate and Actemra, given prednisone taper for acute pain. Asked to get follow-up blood work at [facility] in 4 weeks (patient preference) and to watch for signs of infection.     Since then, was admitted to [facility] for perianal abscess from 8/25-8/31 and underwent I&D. Plan was to do oral ciprofloxacin antibiotic. Continued on steroid taper on discharge. Saw endocrinology 01/17/2019 with adjustment to her diabetes regimen. Again hospitalized from 10/17-10/19 for abdominal wall cellulitis and given clindamycin oral antibiotics. Admitted from 10/25-10/27/2020 for RLQ/RUQ abdominal pain and chest pain, improved with IVF and pain medications PRN. Went to the ED for abdominal pain, nausea/vomiting and was referred to GI for gastroparesis and to surgery for stable ventral hernias on imaging.     Since then:  10/28: seen in pain clinic at [facility], given oxycodone and Lyrica  10/30: went to the ED for abdominal pain and vomiting (RUQ and RLQ pain), pain improved with GI cocktail   11/6: tested negative for COVID  11/9: saw PCP and given GI cocktail, phenergan, referred to GI for evaluation, ?gastroparesis   11/23: went to ED for nausea and bilious vomiting, improved with IVF and meds  12/2: saw general surgery for hernia management but increased risk given infections, poorly-controlled DM, body habitus so referred for weight loss discussion  12/7-12/12: admitted for COVID-19 pneumonia, requiring 4L [place] O2. Noted to have tested positive 12/4. Received remdesevir and dexamethasone.   12/17: I called her to discuss her symptoms, she continued to feel terrible with pulmonary symptoms, nausea/vomiting. Asked her to hold both methotrexate and Actemra until infection resolves, with plan to resume when I saw her next in clinic   12/18: again got admitted for respiratory distress 2/2 COVID-19 infection  1/20: seen for telehealth hospital follow-up and asked to continue Tresiba and Humalog insulin      Today, she reports she feels much better. Was in the hospital for 8 weeks, got oxygen in the hospital. Came home last Friday. Her cough and shortness of breath is better. Stopped methotrexate and Actemra on 12/17. Her joints are not hurting her currently. She has morning stiffness when she wakes up, mostly in the hands. Takes about 1 hour or so to loosen up. Not taking any pain medications. Nausea, vomiting, diarrhea resolved, no blood in the stool. Appetite is normal. Taking decadron. On oxycodone for pain as needed. Had a rash on legs, was an itchy rash, now resolved. Now on oxygen 4 L [place], uses it all day. Quit tobacco in 2001. No fevers at home. No urinary  symptoms. Not currently on any antibiotics.     -----------------------------------------  Disease History:  Previously followed by Dr. Zenovia Hanson of [place]. ??She presented for initial evaluation to [facility] March 2017 for transfer of care. ??Patient states that she was initially diagnosed with rheumatoid arthritis around 2011-2012 and was started on methotrexate and Enbrel at the time. ??She was then admitted to [facility] 06/2010 for L hip septic arthritis s/p wash-out with ortho. ??No hx of prosthetic joints. ??Patient reports her Enbrel was stopped (due to septic joint) and she was started on Humira; however this caused worsening of heart failure and later stopped. ??She was on Orencia infusions which patient states worked very well for about a year or so but later stopped when she lost insurance around 2013. ??She was also tried on [place] for some time, but she does not remember when it was stopped. ??When Dr. Lonna Cobb, evaluated the patient she was on Orencia subcutaneous, MTX 25 mg subcutaneous, plaquenil 200 mg bid, and prednisone 5 mg daily. ??She stated she has been on prednisone at varying doses since her diagnosis. ??She had a normal DEXA 07/2015. She also has chronic pain issues and followed with a pain clinic, Dr. Danielle Dess, in [place] - she is on Percocet 5 mg 4x/day. ??She had her last Orencia in 11/15/16.  Patient stated that she has taken Harriette Ohara (2 years prior--> about 6 months) in the past with no improvement in her symptoms  11/2016: Because of continued pain, she was started her on Actemra infusion 8mg /kg  and a prednisone taper. Remains on methotrexate.   Underwent amputation of her right second toe in 07/2018, but developed worsening erythema, swelling, and purulence at amputation site so presented to the ED 5/5 and was started on vancomycin and ertapenem. X-Ray confirmed osteomyelitis and she underwent amputation on 08/24/2018. Due to these infections, she had been on and off her methotrexate and Actemra. The plan was to continue prednisone 10 mg daily until her infection was under control and wound was healed. She was noted to have active synovitis on exam. She was asked to continue to hold Actemra and methotrexate when she saw Dr. Irving Burton in 09/2018.     Review of Systems:  Positive findings noted above, otherwise a review of systems was reviewed and negative    Past Medical, Surgical, Family and Social History reviewed and updated per EMR     Allergies:  Cephalexin, Doxycycline, Enoxaparin sodium, Sulfa (sulfonamide antibiotics), Codeine, Darvocet a500  [propoxyphene n-acetaminophen], Hydrocodone, Meropenem, and Sulfasalazine    Medications:     Current Outpatient Medications:   ???  acetaminophen (TYLENOL) 500 MG tablet, Take 500 mg by mouth Three (3) times a day as needed for pain., Disp: , Rfl:   ???  ALPRAZolam (XANAX) 0.5 MG tablet, Take 0.5 mg by mouth., Disp: , Rfl:   ???  ARIPiprazole (ABILIFY) 5 MG tablet, Take 5 mg by mouth daily., Disp: , Rfl:   ???  ascorbic acid, vitamin C, (VITAMIN C) 500 MG tablet, Take 500 mg by mouth., Disp: , Rfl:   ???  atorvastatin (LIPITOR) 80 MG tablet, Take 1 tablet (80 mg total) by mouth every evening., Disp: 90 tablet, Rfl: 1  ???  blood sugar diagnostic (ACCU-CHEK GUIDE) Strp, by Other route Four (4) times a day (before meals and nightly). Acuu-chek Guide., Disp: 120 each, Rfl: 5  ???  busPIRone (BUSPAR) 10 MG tablet, Take 10 mg by mouth Two (2) times a day. , Disp: , Rfl:   ???  calcium-vitamin D 250-100 mg-unit per tablet, Take 1 tablet by mouth Two (2) times a day., Disp: 60 tablet, Rfl: 11  ???  ciclesonide (ALVESCO) 160 mcg/actuation inhaler, Inhale 4 puffs., Disp: , Rfl:   ???  dexAMETHasone (DECADRON) 1 MG tablet, Take 2 mg daily for 3days,Take 1mg  daily for 3 days, Take 1mg  every other day for 3days,then stop, Disp: , Rfl:   ???  diclofenac sodium (VOLTAREN) 1 % gel, Apply 2 g topically nightly., Disp: , Rfl:   ???  empty container Misc, Use as directed, Disp: 1 each, Rfl: PRN  ???  famotidine (PEPCID) 20 MG tablet, Take 20 mg by mouth., Disp: , Rfl:   ???  FLUoxetine (PROZAC) 20 MG capsule, Take 20 mg by mouth daily. , Disp: , Rfl:   ???  folic acid (FOLVITE) 1 MG tablet, Take 1 tablet (1 mg total) by mouth daily., Disp: 90 tablet, Rfl: 3  ???  furosemide (LASIX) 20 MG tablet, Take 20 mg by mouth., Disp: , Rfl:   ???  insulin degludec (TRESIBA FLEXTOUCH U-200) 200 unit/mL (3 mL) InPn, Use daily as directed by MD - start with 65 units daily, up to 100 units daily., Disp: 18 mL, Rfl: 4  ???  insulin syringes, disposable, 1 mL Syrg, 100 Units by Miscellaneous route daily. Insulin injecting diabetes 250.00 Inject daily 100 unit syringe (1ml), Disp: 100 each, Rfl: 3  ???  ipratropium-albuteroL (DUO-NEB) 0.5-2.5 mg/3 mL nebulizer, Inhale 3 mL., Disp: , Rfl:   ???  levothyroxine (SYNTHROID) 88 MCG tablet, Take 1 tablet (88 mcg total) by mouth daily., Disp: 90 tablet, Rfl: 1  ???  linaGLIPtin (TRADJENTA) 5 mg Tab, Take 5 mg by mouth., Disp: , Rfl:   ???  lisinopriL (PRINIVIL,ZESTRIL) 2.5 MG tablet, , Disp: , Rfl:   ???  LYRICA 150 mg capsule, Take 150 mg by mouth Three (3) times a day., Disp: , Rfl: 0  ???  nystatin (MYCOSTATIN) 100,000 unit/gram powder, Apply 1 application topically 4 (four) times a day as needed., Disp: , Rfl:   ???  nystatin (MYCOSTATIN) 100,000 unit/mL suspension, Take 5 mL (500,000 Units total) by mouth Four (4) times a day., Disp: 60 mL, Rfl: 0  ???  oxyCODONE (ROXICODONE) 5 MG immediate release tablet, Take 1 tablet (5 mg total) by mouth every four (4) hours as needed for up to 5 doses., Disp: 10 tablet, Rfl: 0  ???  polyethylene glycol (GLYCOLAX) 17 gram/dose powder, Take 17 g by mouth., Disp: , Rfl:   ???  pramipexole (MIRAPEX) 0.125 MG tablet, Take 1 tablet (0.125 mg total) by mouth nightly. Can increase to 2 tablets (0.250 mg) in one week if one tablet is not effective., Disp: 60 tablet, Rfl: 1  ???  predniSONE (DELTASONE) 5 MG tablet, Take 20 mg daily x 2 weeks, 15 mg daily x 2 weeks, 10 mg daily x 2 weeks, then 5 mg until visit with rheumatology, Disp: 182 tablet, Rfl: 1  ???  promethazine (PHENERGAN) 25 MG suppository, Insert 1 suppository (25 mg total) into the rectum every six (6) hours as needed for nausea. May use suppository if unable to tolerate PO., Disp: 12 suppository, Rfl: 1  ???  promethazine (PHENERGAN) 25 MG tablet, Take 1 tablet (25 mg total) by mouth every six (6) hours as needed for nausea., Disp: 30 tablet, Rfl: 1  ???  QUEtiapine (SEROQUEL) 300 MG tablet, Take 1 tablet (300 mg total) by mouth nightly., Disp: 90 tablet, Rfl: 3  ???  senna (  SENOKOT) 8.6 mg tablet, Take 2 tablets by mouth nightly as needed for constipation., Disp: 60 tablet, Rfl: 0  ???  solifenacin (VESICARE) 5 MG tablet, Take 1 tablet (5 mg total) by mouth daily., Disp: 30 tablet, Rfl: 12  ???  zinc sulfate (ZINCATE) 220 (50) mg capsule, Take 220 mg by mouth., Disp: , Rfl:   ???  zolpidem (AMBIEN) 5 MG tablet, Take 5 mg by mouth nightly as needed for sleep., Disp: , Rfl:       Objective   There were no vitals filed for this visit.    Physical Exam  LMP  (LMP Unknown) Comment: negative pregnancy test  No physical exam due to phone visit    Assessment/Plan:    Amanda Hanson is a 59 y.o.  female with past medical history of hypothyroidism, obesity, heart failure, DM (A1C 13.4% 08/2018), chronic pain who is here for phone follow-up of seropositive (+RF, +anti-CCP), non-erosive rheumatoid arthritis. She has had multiple infections in the past few months, including cellulitis of her abdominal wall and COVID-19 infection, and has held her methotrexate and Actemra for the past one month. Currently, her joints are not causing significant issues, but she is on decadron for her COVID-19 infection therapy and I suspect her joints will flare post-steroids. We will initiate low-dose methotrexate at this time, but will hold off on initiating the biologic.     1. Seropositive, non-erosive RA:  Has had 1 hour of morning stiffness these days, off methotrexate and Actemra for 1 month due to COVID-19 infection. Currently feeling okay from joint perspective given that she is on steroids.   -will restart methotrexate to prevent flare, but at 10 mg weekly PO today with daily folic acid  -last labs done 02/2019 showed normal Cr and LFTs, CBC  -hold Actemra, do not restart  -decadron as per COVID-19 treatment, no prednisone at this time    Health Maintenance  Routine health maintenance discussed      - CDC recommends all immunosuppressed adults receive vaccination against pneumonococcus.. Adults 19 years or older who have not received any pneumococcal vaccine, should get a dose of PCV13 first and should also continue to receive the recommended doses of PPSV23. Adults 19 years or older who have previously received one or more doses of PPSV23, should also receive a dose of PCV13 and should continue to receive the remaining recommended doses of PPSV23.  Immunization History   Administered Date(s) Administered   ??? INFLUENZA TIV (TRI) PF (IM) 02/04/2005   ??? Influenza Vaccine Quad (IIV4 PF) 64mo+ injectable 04/14/2015, 03/23/2017, 02/07/2018, 02/01/2019   ??? Influenza Virus Vaccine, unspecified formulation 02/02/2019   ??? PNEUMOCOCCAL POLYSACCHARIDE 23 03/23/2017, 04/16/2019   ??? Pneumococcal Conjugate 13-Valent 04/22/2014         - DEXA Scan: normal bone density 07/2015      - Vitamin D: added to blood work in 4 weeks, taking Ca-Vitamin D supplement      - Tdap: said she got one 2 years ago?      - Annual flu vaccination: last 01/2019    Patient was discussed with attending physician, Dr. Berton Lan, who also spoke with the patient.     I spent 25 minutes on the phone visit with the patient on the date of service. I spent an additional 5 minutes on pre- and post-visit activities.     The patient was not located and I was located within 250 yards of a hospital based location during the phone visit. The  patient was physically located in [place] or a state in which I am permitted to provide care. The patient and/or parent/guardian understood that s/he may incur co-pays and cost sharing, and agreed to the telemedicine visit. The visit was reasonable and appropriate under the circumstances given the patient's presentation at the time.    The patient and/or parent/guardian has been advised of the potential risks and limitations of this mode of treatment (including, but not limited to, the absence of in-person examination) and has agreed to be treated using telemedicine. The patient's/patient's family's questions regarding telemedicine have been answered.    If the visit was completed in an ambulatory setting, the patient and/or parent/guardian has also been advised to contact their provider???s office for worsening conditions, and seek emergency medical treatment and/or call 911 if the patient deems either necessary.    RTC 2/12 at 3 PM for in-person follow-up    Tommy Rainwater, MD, PGY-4  [facility]  [phone]  Pager: [phone]

## 2019-05-14 ENCOUNTER — Other Ambulatory Visit: Payer: Self-pay

## 2019-05-14 ENCOUNTER — Ambulatory Visit: Payer: Medicaid Other | Attending: Pain Medicine | Admitting: Pain Medicine

## 2019-05-14 ENCOUNTER — Telehealth: Admit: 2019-05-14 | Discharge: 2019-05-15 | Payer: MEDICAID | Attending: Rheumatology | Primary: Rheumatology

## 2019-05-14 DIAGNOSIS — L89302 Pressure ulcer of unspecified buttock, stage 2: Principal | ICD-10-CM

## 2019-05-14 DIAGNOSIS — M0579 Rheumatoid arthritis with rheumatoid factor of multiple sites without organ or systems involvement: Principal | ICD-10-CM

## 2019-05-14 DIAGNOSIS — G894 Chronic pain syndrome: Secondary | ICD-10-CM

## 2019-05-14 MED ORDER — METHOTREXATE SODIUM 2.5 MG TABLET
ORAL_TABLET | ORAL | 1 refills | 105 days | Status: CP
Start: 2019-05-14 — End: 2019-06-13

## 2019-05-14 MED ORDER — FOLIC ACID 1 MG TABLET
ORAL_TABLET | Freq: Every day | ORAL | 3 refills | 90 days | Status: CP
Start: 2019-05-14 — End: ?

## 2019-05-14 NOTE — Unmapped (Signed)
Please start methotrexate 10 mg weekly  Continue to not take Actemra  I will see you in person in clinic on Friday 2/12

## 2019-05-14 NOTE — Telephone Encounter (Signed)
attempted to call patient and phone continues to make a busy signal.

## 2019-05-16 ENCOUNTER — Other Ambulatory Visit: Payer: Self-pay

## 2019-05-16 ENCOUNTER — Telehealth: Payer: Self-pay | Admitting: Internal Medicine

## 2019-05-16 NOTE — Telephone Encounter (Signed)
[facility] TELEPHONE ENCOUNTER NOTE  Patient: [name] [name] ETK:[id number]   PCP: [facility] [place] [facility] DOB: [birth year]   DOS: 05/16/2019     Pt insurance was not covering Alvesco, it was changed to Flovent which is covered by her insurance.  Verified with the pharmacy. 1 month supply was provided.   Author:  Lynden Oxford, MD [facility] 05/16/2019  If 7PM-7AM, please contact night-coverage To reach On-call, see [email or web address]

## 2019-05-18 DIAGNOSIS — G2581 Restless legs syndrome: Principal | ICD-10-CM

## 2019-05-18 DIAGNOSIS — R252 Cramp and spasm: Principal | ICD-10-CM

## 2019-05-18 MED ORDER — PRAMIPEXOLE 0.125 MG TABLET
ORAL_TABLET | Freq: Every evening | ORAL | 2 refills | 60 days | Status: CP
Start: 2019-05-18 — End: ?

## 2019-05-23 ENCOUNTER — Emergency Department
Admission: EM | Admit: 2019-05-23 | Discharge: 2019-05-23 | Disposition: A | Discharge status: Home / Self Care | Payer: Medicaid Other | Attending: Student in an Organized Health Care Education/Training Program | Admitting: Student in an Organized Health Care Education/Training Program

## 2019-05-23 ENCOUNTER — Emergency Department: Payer: Medicaid Other

## 2019-05-23 ENCOUNTER — Encounter: Payer: Self-pay | Admitting: Emergency Medicine

## 2019-05-23 ENCOUNTER — Other Ambulatory Visit: Payer: Self-pay

## 2019-05-23 ENCOUNTER — Institutional Professional Consult (permissible substitution): Admit: 2019-05-23 | Discharge: 2019-05-24 | Payer: MEDICAID | Attending: Family | Primary: Family

## 2019-05-23 DIAGNOSIS — Z87891 Personal history of nicotine dependence: Secondary | ICD-10-CM | POA: Diagnosis not present

## 2019-05-23 DIAGNOSIS — Z7984 Long term (current) use of oral hypoglycemic drugs: Secondary | ICD-10-CM | POA: Diagnosis not present

## 2019-05-23 DIAGNOSIS — J449 Chronic obstructive pulmonary disease, unspecified: Secondary | ICD-10-CM | POA: Insufficient documentation

## 2019-05-23 DIAGNOSIS — I5032 Chronic diastolic (congestive) heart failure: Secondary | ICD-10-CM | POA: Insufficient documentation

## 2019-05-23 DIAGNOSIS — R059 Cough, unspecified: Secondary | ICD-10-CM

## 2019-05-23 DIAGNOSIS — R05 Cough: Secondary | ICD-10-CM | POA: Insufficient documentation

## 2019-05-23 DIAGNOSIS — I130 Hypertensive heart and chronic kidney disease with heart failure and stage 1 through stage 4 chronic kidney disease, or unspecified chronic kidney disease: Secondary | ICD-10-CM | POA: Insufficient documentation

## 2019-05-23 DIAGNOSIS — R0602 Shortness of breath: Secondary | ICD-10-CM | POA: Diagnosis present

## 2019-05-23 DIAGNOSIS — E039 Hypothyroidism, unspecified: Secondary | ICD-10-CM | POA: Insufficient documentation

## 2019-05-23 DIAGNOSIS — J9611 Chronic respiratory failure with hypoxia: Secondary | ICD-10-CM | POA: Diagnosis not present

## 2019-05-23 DIAGNOSIS — N183 Chronic kidney disease, stage 3 unspecified: Secondary | ICD-10-CM | POA: Insufficient documentation

## 2019-05-23 DIAGNOSIS — E1122 Type 2 diabetes mellitus with diabetic chronic kidney disease: Secondary | ICD-10-CM | POA: Insufficient documentation

## 2019-05-23 DIAGNOSIS — E1140 Type 2 diabetes mellitus with diabetic neuropathy, unspecified: Secondary | ICD-10-CM | POA: Diagnosis not present

## 2019-05-23 DIAGNOSIS — Z79899 Other long term (current) drug therapy: Secondary | ICD-10-CM | POA: Diagnosis not present

## 2019-05-23 LAB — CBC
HCT: 33.4 % — ABNORMAL LOW (ref 36.0–46.0)
Hemoglobin: 10.4 g/dL — ABNORMAL LOW (ref 12.0–15.0)
MCH: 25.5 pg — ABNORMAL LOW (ref 26.0–34.0)
MCHC: 31.1 g/dL (ref 30.0–36.0)
MCV: 81.9 fL (ref 80.0–100.0)
Platelets: 252 10*3/uL (ref 150–400)
RBC: 4.08 MIL/uL (ref 3.87–5.11)
RDW: 16.8 % — ABNORMAL HIGH (ref 11.5–15.5)
WBC: 8 10*3/uL (ref 4.0–10.5)
nRBC: 0 % (ref 0.0–0.2)

## 2019-05-23 LAB — BASIC METABOLIC PANEL
Anion gap: 10 (ref 5–15)
BUN: 16 mg/dL (ref 6–20)
CO2: 27 mmol/L (ref 22–32)
Calcium: 9.2 mg/dL (ref 8.9–10.3)
Chloride: 100 mmol/L (ref 98–111)
Creatinine, Ser: 1.07 mg/dL — ABNORMAL HIGH (ref 0.44–1.00)
GFR calc Af Amer: >60 mL/min (ref >60)
GFR calc non Af Amer: 58 mL/min — ABNORMAL LOW (ref >60)
Glucose, Bld: 331 mg/dL — ABNORMAL HIGH (ref 70–99)
Potassium: 4.3 mmol/L (ref 3.5–5.1)
Sodium: 137 mmol/L (ref 135–145)

## 2019-05-23 LAB — BRAIN NATRIURETIC PEPTIDE: B Natriuretic Peptide: 19 pg/mL (ref 0.0–100.0)

## 2019-05-23 LAB — LACTIC ACID, PLASMA
Lactic Acid, Venous: 2.7 mmol/L (ref 0.5–1.9)
Lactic Acid, Venous: 1.7 mmol/L (ref 0.5–1.9)

## 2019-05-23 LAB — TROPONIN I (HIGH SENSITIVITY)
Troponin I (High Sensitivity): 3 ng/L (ref <18)
Troponin I (High Sensitivity): 2 ng/L (ref <18)

## 2019-05-23 MED ORDER — BENZONATATE 100 MG PO CAPS: 100.0000 mg | ORAL_CAPSULE | Freq: Three times a day (TID) | ORAL | 0 refills | Status: DC | PRN

## 2019-05-23 MED ORDER — IOHEXOL 350 MG/ML SOLN
100.0000 mL | Freq: Once | INTRAVENOUS | Status: AC | PRN
  Administered 2019-05-23: 20:00:00 100 mL via INTRAVENOUS

## 2019-05-23 MED ORDER — BENZONATATE 100 MG PO CAPS
100.0000 mg | ORAL_CAPSULE | Freq: Once | ORAL | Status: AC
  Administered 2019-05-23: 22:00:00 100 mg via ORAL
  Filled 2019-05-23: qty 1

## 2019-05-23 MED ORDER — LIDOCAINE HCL (PF) 4 % IJ SOLN
4.0000 mL | Freq: Once | INTRAMUSCULAR | Status: AC
  Administered 2019-05-23: 4 mL via RESPIRATORY_TRACT
  Filled 2019-05-23: qty 5
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
## 2019-05-23 NOTE — ED Provider Notes (Signed)
[facility] Emergency Department Provider Note    First MD Initiated Contact with Patient 05/23/19 1846     (approximate)  I have reviewed the triage vital signs and the nursing notes.   HISTORY  Chief Complaint Shortness of Breath    HPI [name] [name] is a 59 y.o. female extensive past medical history as listed below recent admission for COVID-19 frequent as she is a hospital requiring 3 to 4 L nasal cannula chronic presents the ER for shortness of breath but worsening cough.  No worsening fevers.  No vomiting.  Not having worsening swelling.  No active chest pain at this point but does have some discomfort when she takes deep inspiration.    Past Medical History:  Diagnosis Date  . Abdominal wall hernia 01/29/2013  . Anxiety   . Arthritis    Rheumatoid  . C. difficile colitis   . Chronic diastolic heart failure (HCC)   . Depression   . Diabetes mellitus    states no meds or diet restrictions  at present  . Diastolic CHF (HCC)   . Esophagitis   . Fluid retention   . GERD (gastroesophageal reflux disease)   . Hiatal hernia   . Hypertension   . Hypokalemia due to loss of potassium 10/21/2015   Overview:  Associated with 3 weeks of diarrhea  And QT prolongation.  . Hypothyroidism   . IBS (irritable bowel syndrome)   . Moderate episode of recurrent major depressive disorder (HCC) 06/03/2004  . Morbid obesity (HCC)   . MRSA (methicillin resistant Staphylococcus aureus) infection 11/2017   left inner thigh abcess  . Neurogenic bladder    has pacemaker  . Neuropathy   . Obesity   . Panic attacks   . Rheumatoid arthritis (HCC)   . Sleep apnea    STATES SEVERE, CANT TOLERATE MASK- LAST STUDY YEARS AGO   Family History  Problem Relation Age of Onset  . Heart failure Father   . Bipolar disorder Father   . Alcohol abuse Father   . Anxiety disorder Father   . Depression Father   . Heart disease Brother   . Heart attack Brother 51       MI  s/p stents placed  . Anxiety disorder Sister   . Depression Sister   . Anxiety disorder Sister   . Depression Sister   . Bipolar disorder Sister   . Alcohol abuse Sister   . Drug abuse Sister   . Heart attack Brother    Past Surgical History:  Procedure Laterality Date  . ABDOMINAL HYSTERECTOMY    . CHOLECYSTECTOMY    . DG GREAT TOE RIGHT FOOT  02/23/2018  . EYE SURGERY     bilateral cataract extraction with IOL  . HERNIA REPAIR     ventral hernia with strangulation  . LAPAROSCOPIC GASTRIC BANDING  03/20/07  . TONSILLECTOMY    . TUBAL LIGATION     Patient Active Problem List   Diagnosis Date Noted  . Acute respiratory disease due to COVID-19 virus 04/06/2019  . Type II diabetes mellitus with renal manifestations (HCC) 04/06/2019  . Sepsis (HCC) 04/06/2019  . CKD (chronic kidney disease), stage III 04/06/2019  . HLD (hyperlipidemia) 04/06/2019  . COVID-19 03/27/2019  . Pneumonia due to COVID-19 virus 03/27/2019  . Abdominal pain 02/12/2019  . AKI (acute kidney injury) (HCC) 02/12/2019  . Cellulitis, abdominal wall 02/04/2019  . Elevated C-reactive protein (CRP) 12/15/2018  . Elevated sed rate 12/15/2018  .  Pharmacologic therapy 11/06/2018  . Disorder of skeletal system 11/06/2018  . Problems influencing health status 11/06/2018  . Decubitus ulcer of heel, bilateral 02/21/2018  . Diabetic ulcer of toe of right foot associated with type 2 diabetes mellitus (HCC) 02/14/2018  . Ulcer of left heel and midfoot with fat layer exposed (HCC) 02/14/2018  . MRSA (methicillin resistant Staphylococcus aureus) infection 11/17/2017  . Cellulitis 11/15/2017  . Perineal abscess 11/15/2017  . Subacute vulvitis 11/01/2017  . Chronic cystitis 03/01/2017  . Chronic pain syndrome 03/31/2016  . Insomnia secondary to chronic pain 03/31/2016  . Chronic upper back pain 12/25/2015  . Chronic hand pain (Bilateral) (L>R) 12/25/2015  . Rheumatoid arthritis (HCC) 12/25/2015  . Osteoarthritis,  multiple sites 12/24/2015  . Chronic foot pain (Primary Area of Pain) (Bilateral) (L>R) 12/24/2015  . Chronic elbow pain (Third area of Pain) (Bilateral) (L>R) 12/24/2015  . Chronic shoulder pain (Bilateral) (L>R) 12/24/2015  . Chronic neck pain (Bilateral) (R>L) 12/24/2015  . 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[employer]) 12/23/2015  . Chronic knee pain (Bilateral) (R>L) 12/23/2015  . Long term current use of opiate analgesic 12/23/2015  . Long term prescription opiate use 12/23/2015  . Opiate use (30 MME/Day) 12/23/2015  . Neurogenic pain 12/23/2015  . Neuropathic pain 12/23/2015  . Diabetic peripheral neuropathy (HCC) 12/23/2015  . Encounter for therapeutic drug level monitoring 12/23/2015  . Encounter for pain management planning 12/23/2015  . GERD (gastroesophageal reflux disease) 11/25/2015  . Neuropathy 11/25/2015  . Diarrhea 10/22/2015  . Hypokalemia 10/21/2015  . Hypomagnesemia 10/21/2015  . QT prolongation 10/21/2015  . 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 Chronic wrist pain (Secondary area of Pain) (Bilateral) (L>R) 03/19/2015  . 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 Female genuine stress incontinence 02/14/2015  . Urge incontinence of urine 02/14/2015  . Obstructive apnea 07/03/2014  . Chronic diastolic CHF (congestive heart failure) (HCC) 07/03/2014  . Anxiety 01/03/2014  . Diabetic ulcer of heel (HCC) 01/03/2014  . COPD (chronic obstructive pulmonary disease) (HCC) 01/03/2014  . Bipolar disorder, unspecified (HCC) 01/03/2014  . Diastolic dysfunction 01/03/2014  . Combined fat and carbohydrate induced hyperlipemia 01/03/2014  . Shortness of breath 01/03/2014  . Acute on chronic congestive heart failure (HCC) 01/03/2014  . Incomplete bladder emptying 11/02/2012  . Bladder retention 10/10/2012  . Detrusor muscle hypertonia 10/04/2012  . Obstruction of urinary tract 10/04/2012  . FOM (frequency of micturition) 10/04/2012  .  Mixed incontinence 10/04/2012  . Vitamin D insufficiency 04/15/2012  . Borderline personality disorder (HCC) 01/06/2012  . Mixed anxiety depressive disorder 01/06/2012  . History of laparoscopic adjustable gastric banding, 03/20/2007.  Removed 09/19/2011. 08/04/2011  . Nausea & vomiting 08/04/2011  . Hypothyroidism 06/28/2010  . Rheumatoid arthritis 06/28/2010  . Obesity 03/29/2005  . Essential (primary) hypertension 01/27/2005  . Major depressive disorder, recurrent episode, moderate (HCC) 06/03/2004      Prior to Admission medications   Medication Sig Start Date End Date Taking? Authorizing Provider  ALPRAZolam Prudy Feeler) 0.5 MG tablet Take 1 tablet (0.5 mg total) by mouth 2 (two) times daily as needed for anxiety. 05/04/19   Rolly Salter, MD  ARIPiprazole (ABILIFY) 5 MG tablet TAKE 1 TABLET BY MOUTH ONCE DAILY. Patient taking differently: Take 5 mg by mouth daily.  02/22/19   Clapacs, Jackquline Denmark, MD  ascorbic acid (VITAMIN C) 500 MG tablet Take 1 tablet (500 mg total) by mouth daily. 05/05/19   Rolly Salter, MD  atorvastatin (LIPITOR) 80 MG tablet Take  80 mg by mouth daily at 6 PM.  02/09/13   [provider]  benzonatate (TESSALON PERLES) 100 MG capsule Take 1 capsule (100 mg total) by mouth 3 (three) times daily as needed for cough. 05/23/19 05/22/20  Willy Eddy, MD  busPIRone (BUSPAR) 10 MG tablet TAKE 1 TABLET BY MOUTH TWICE DAILY Patient taking differently: Take 10 mg by mouth 2 (two) times daily.  02/05/19   Clapacs, Jackquline Denmark, MD  Calcium Carb-Ergocalciferol 250-125 MG-UNIT TABS Take 1 tablet by mouth daily.     [provider]  Cholecalciferol (VITAMIN D3) 125 MCG (5000 UT) CAPS Take 1 capsule (5,000 Units total) by mouth daily with breakfast. Take along with calcium and magnesium. 02/14/19 02/14/20  Delano Metz, MD  ciclesonide (ALVESCO) 160 MCG/ACT inhaler Inhale 4 puffs into the lungs 2 (two) times daily. 05/04/19   Rolly Salter, MD  dexamethasone  (DECADRON) 1 MG tablet Take 2 mg daily for 3days,Take 1mg  daily for 3 days, Take 1mg  every other day for 3days,then stop 05/04/19   Rolly Salter, MD  Ensure Max Protein (ENSURE MAX PROTEIN) LIQD Take 330 mLs (11 oz total) by mouth 2 (two) times daily between meals. 05/04/19   Rolly Salter, MD  famotidine (PEPCID) 20 MG tablet Take 1 tablet (20 mg total) by mouth daily. 05/05/19   Rolly Salter, MD  FLUoxetine (PROZAC) 20 MG capsule TAKE (1) CAPSULE BY MOUTH ONCE DAILY. Patient taking differently: Take 20 mg by mouth daily. TAKE (1) CAPSULE BY MOUTH ONCE DAILY. 11/30/18   Clapacs, Jackquline Denmark, MD  folic acid (FOLVITE) 1 MG tablet Take 1 mg by mouth daily.     [provider]  furosemide (LASIX) 20 MG tablet Take 1 tablet (20 mg total) by mouth daily as needed for fluid or edema (weight gain >3 lbs in 1 day or >5 lbs in 2 days). 05/04/19 05/03/20  Rolly Salter, MD  Insulin Degludec (TRESIBA FLEXTOUCH) 200 UNIT/ML SOPN Inject 65-100 Units into the skin daily.    [provider]  ipratropium-albuterol (DUONEB) 0.5-2.5 (3) MG/3ML SOLN Take 3 mLs by nebulization 3 (three) times daily. 05/04/19   Rolly Salter, MD  levothyroxine (SYNTHROID, LEVOTHROID) 88 MCG tablet Take 88 mcg by mouth daily before breakfast.    [provider]  linagliptin (TRADJENTA) 5 MG TABS tablet Take 1 tablet (5 mg total) by mouth daily. 05/05/19   Rolly Salter, MD  lisinopril (PRINIVIL,ZESTRIL) 2.5 MG tablet Take 2.5 mg by mouth daily.    [provider]  metFORMIN (GLUCOPHAGE) 500 MG tablet Take 500 mg by mouth 2 (two) times daily with a meal.     [provider]  oxyCODONE (OXY IR/ROXICODONE) 5 MG immediate release tablet Take 1 tablet (5 mg total) by mouth every 6 (six) hours as needed for severe pain. Must last 30 days 04/15/19 05/15/19  Delano Metz, MD  polyethylene glycol (MIRALAX / GLYCOLAX) 17 g packet Take 17 g by mouth daily as needed for mild constipation. 05/04/19    Rolly Salter, MD  pramipexole (MIRAPEX) 0.125 MG tablet Take 0.125 mg by mouth 2 (two) times daily. 03/08/19   [provider]  pregabalin (LYRICA) 150 MG capsule Take 1 capsule (150 mg total) by mouth 3 (three) times daily. 02/14/19 08/13/19  Delano Metz, MD  QUEtiapine (SEROQUEL) 300 MG tablet TAKE ONE TABLET BY MOUTH AT BEDTIME. Patient taking differently: Take 300 mg by mouth at bedtime. TAKE ONE TABLET BY MOUTH  AT BEDTIME. 02/05/19   Clapacs, Madie Reno, MD  VESICARE 5 MG tablet Take 5 mg by mouth daily. 03/16/19   [provider]  zinc sulfate 220 (50 Zn) MG capsule Take 1 capsule (220 mg total) by mouth daily. 05/05/19   Lavina Hamman, MD  zolpidem (AMBIEN) 5 MG tablet TAKE 1 TABLET BY MOUTH EVERYDAY AT BEDTIME Patient taking differently: Take 5 mg by mouth at bedtime. TAKE 1 TABLET BY MOUTH EVERYDAY AT BEDTIME 11/30/18   Clapacs, Madie Reno, MD    Allergies Cephalexin, Codeine, Doxycycline, Propoxyphene, Sulfa antibiotics, Lovenox [enoxaparin sodium], Hydrocodone, and Meropenem    Social History Social History   Tobacco Use  . Smoking status: Former Smoker    Packs/day: 2.00    Years: 27.00    Pack years: 54.00    Types: Cigarettes    Quit date: 07/30/1999    Years since quitting: 19.8  . Smokeless tobacco: Never Used  Substance Use Topics  . Alcohol use: No  . Drug use: No    Review of Systems Patient denies headaches, rhinorrhea, blurry vision, numbness, shortness of breath, chest pain, edema, cough, abdominal pain, nausea, vomiting, diarrhea, dysuria, fevers, rashes or hallucinations unless otherwise stated above in HPI. ____________________________________________   PHYSICAL EXAM:  VITAL SIGNS: Vitals:   05/23/19 1943 05/23/19 2009  BP:  111/65  Pulse: 93 100  Resp: (!) 22 (!) 21  Temp:    SpO2: 100% 96%    Constitutional: Alert and oriented.protecting her airway Eyes: Conjunctivae are normal.  Head: Atraumatic. Nose: No  congestion/rhinnorhea. Mouth/Throat: Mucous membranes are moist.   Neck: No stridor. Painless ROM.  Cardiovascular: Normal rate, regular rhythm. Grossly normal heart sounds.  Good peripheral circulation. Respiratory:.  No retractions. Lungs with diffuse crackles Gastrointestinal: Soft and nontender. No distention. No abdominal bruits. No CVA tenderness. Genitourinary:  Musculoskeletal: No lower extremity tenderness, BLE edema.  No joint effusions. Neurologic:  Normal speech and language. No gross focal neurologic deficits are appreciated. No facial droop Skin:  Skin is warm, dry and intact. No rash noted. Psychiatric: Mood and affect are normal. Speech and behavior are normal.  ____________________________________________   LABS (all labs ordered are listed, but only abnormal results are displayed)  Results for orders placed or performed during the hospital encounter of 05/23/19 (from the past 24 hour(s))  Basic metabolic panel     Status: Abnormal   Collection Time: 05/23/19  5:20 PM  Result Value Ref Range   Sodium 137 135 - 145 mmol/L   Potassium 4.3 3.5 - 5.1 mmol/L   Chloride 100 98 - 111 mmol/L   CO2 27 22 - 32 mmol/L   Glucose, Bld 331 (H) 70 - 99 mg/dL   BUN 16 6 - 20 mg/dL   Creatinine, Ser 1.07 (H) 0.44 - 1.00 mg/dL   Calcium 9.2 8.9 - 10.3 mg/dL   GFR calc non Af Amer 58 (L) >60 mL/min   GFR calc Af Amer >60 >60 mL/min   Anion gap 10 5 - 15  CBC     Status: Abnormal   Collection Time: 05/23/19  5:20 PM  Result Value Ref Range   WBC 8.0 4.0 - 10.5 K/uL   RBC 4.08 3.87 - 5.11 MIL/uL   Hemoglobin 10.4 (L) 12.0 - 15.0 g/dL   HCT 33.4 (L) 36.0 - 46.0 %   MCV 81.9 80.0 - 100.0 fL   MCH 25.5 (L) 26.0 - 34.0 pg   MCHC 31.1 30.0 - 36.0 g/dL  RDW 16.8 (H) 11.5 - 15.5 %   Platelets 252 150 - 400 K/uL   nRBC 0.0 0.0 - 0.2 %  Troponin I (High Sensitivity)     Status: None   Collection Time: 05/23/19  5:20 PM  Result Value Ref Range   Troponin I (High Sensitivity) 3 <18  ng/L  Lactic acid, plasma     Status: Abnormal   Collection Time: 05/23/19  5:20 PM  Result Value Ref Range   Lactic Acid, Venous 2.7 (HH) 0.5 - 1.9 mmol/L  Lactic acid, plasma     Status: None   Collection Time: 05/23/19  7:29 PM  Result Value Ref Range   Lactic Acid, Venous 1.7 0.5 - 1.9 mmol/L  Troponin I (High Sensitivity)     Status: None   Collection Time: 05/23/19  7:29 PM  Result Value Ref Range   Troponin I (High Sensitivity) 2 <18 ng/L  Brain natriuretic peptide     Status: None   Collection Time: 05/23/19  7:29 PM  Result Value Ref Range   B Natriuretic Peptide 19.0 0.0 - 100.0 pg/mL   ____________________________________________  EKG My review and personal interpretation at Time: 17:18   Indication: cough  Rate: 100  Rhythm: sinus Axis: normal Other: normal intervals, no stemi ____________________________________________  RADIOLOGY  I personally reviewed all radiographic images ordered to evaluate for the above acute complaints and reviewed radiology reports and findings.  These findings were personally discussed with the patient.  Please see medical record for radiology report.  ____________________________________________   PROCEDURES  Procedure(s) performed:  Procedures    Critical Care performed: no ____________________________________________   INITIAL IMPRESSION / ASSESSMENT AND PLAN / ED COURSE  Pertinent labs & imaging results that were available during my care of the patient were reviewed by me and considered in my medical decision making (see chart for details).   DDX: Pneumonia, CHF, PE, bronchitis, pleuritis, edema, CHF, post Covid syndrome  IVIONA HOLE is a 59 y.o. who presents to the ED with symptoms as described above.  Patient protecting her airway no new hypoxia.  Given her symptoms will order CT angiogram as well as blood work to evaluate for above complaints.  The patient will be placed on continuous pulse oximetry and telemetry for  monitoring.  Laboratory evaluation will be sent to evaluate for the above complaints.     Clinical Course as of May 23 2115  Wed May 23, 2019  2019 Blood work is reassuring.  Repeat lactate normal.  Suspect that the first 1 was false.   [PR]  2108 CTA shows no evidence of PE.  Doubt recurrence of pneumonia given reassuring blood work no fever no worsening hypoxia.  She feels significantly improved after inhaled lidocaine.  States that her primary concern was persistent cough.   [PR]    Clinical Course User Index [PR] Willy Eddy, MD    The patient was evaluated in Emergency Department today for the symptoms described in the history of present illness. He/she was evaluated in the context of the global COVID-19 pandemic, which necessitated consideration that the patient might be at risk for infection with the SARS-CoV-2 virus that causes COVID-19. Institutional protocols and algorithms that pertain to the evaluation of patients at risk for COVID-19 are in a state of rapid change based on information released by regulatory bodies including the CDC and federal and state organizations. These policies and algorithms were followed during the patient's care in the ED.  As part of my  medical decision making, I reviewed the following data within the electronic MEDICAL RECORD NUMBER Nursing notes reviewed and incorporated, Labs reviewed, notes from prior ED visits and [place] Controlled Substance Database   ____________________________________________   FINAL CLINICAL IMPRESSION(S) / ED DIAGNOSES  Final diagnoses:  Cough  Chronic respiratory failure with hypoxia (HCC)      NEW MEDICATIONS STARTED DURING THIS VISIT:  New Prescriptions   BENZONATATE (TESSALON PERLES) 100 MG CAPSULE    Take 1 capsule (100 mg total) by mouth 3 (three) times daily as needed for cough.     Note:  This document was prepared using Dragon voice recognition software and may include unintentional dictation errors.      Willy Eddy, MD 05/23/19 2117

## 2019-05-23 NOTE — ED Notes (Signed)
Pt returned from CT °

## 2019-05-23 NOTE — ED Triage Notes (Signed)
Pt here for [facility] and cough.  Was dx with covid early December.  Pt on 4 L [place] since dx.  Has been in and out of hospital r/t hypoxia and PNA since.  Some chest soreness per pt.  No hypoxia at this time. Mildly labored with exertion but no distress.  SHOB got worse this week per pt.  No hx blood clots.  No fever.  Cough is dry.

## 2019-05-24 ENCOUNTER — Telehealth: Payer: Self-pay | Admitting: Nurse Practitioner

## 2019-05-24 ENCOUNTER — Other Ambulatory Visit: Payer: Self-pay | Admitting: Psychiatry

## 2019-05-24 DIAGNOSIS — Z8616 Personal history of COVID-19: Secondary | ICD-10-CM

## 2019-05-24 NOTE — Telephone Encounter (Signed)
Spoke with patient regarding Palliative services and she has declined our services at this time. Patient stated that she was doing okay at the present time.  I told her that if she changed her mind to please contact her MD office and let them know so they could send [name] another referral and she was in agreement with this.

## 2019-06-04 ENCOUNTER — Encounter: Payer: Self-pay | Admitting: Pulmonary Disease

## 2019-06-04 ENCOUNTER — Ambulatory Visit: Payer: Medicaid Other | Admitting: Pulmonary Disease

## 2019-06-04 ENCOUNTER — Other Ambulatory Visit: Payer: Self-pay

## 2019-06-04 VITALS — BP 112/70 | HR 95 | Temp 97.7°F | Ht 66.0 in | Wt 310.8 lb

## 2019-06-04 DIAGNOSIS — Z8616 Personal history of COVID-19: Secondary | ICD-10-CM

## 2019-06-04 DIAGNOSIS — M3581 Multisystem inflammatory syndrome: Secondary | ICD-10-CM

## 2019-06-04 DIAGNOSIS — J8410 Pulmonary fibrosis, unspecified: Secondary | ICD-10-CM

## 2019-06-04 MED ORDER — BUDESONIDE 0.5 MG/2ML IN SUSP: 0.5000 mg | Freq: Two times a day (BID) | RESPIRATORY_TRACT | 11 refills | Status: DC

## 2019-06-04 MED ORDER — BUDESONIDE 0.5 MG/2 ML SUSPENSION FOR NEBULIZATION
RESPIRATORY_TRACT | 0.00000 days
Start: 2019-06-04 — End: 2020-06-03

## 2019-06-04 NOTE — Patient Instructions (Addendum)
We will have you see Dr. Marchelle Gearing in consultation.  On 12 March at 10 AM, here at this office.  We will switch your inhaler to Pulmicort (budesonide) you will use this after the DuoNeb (ipratropium/albuterol)nebulizer.  When you come back to see Dr. Marchelle Gearing you will have some blood work done.

## 2019-06-04 NOTE — Progress Notes (Signed)
 Assessment & Plan:  1. 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 Pulmonary fibrosis, postinflammatory (HCC)  3. Personal history of covid-19   Patient Instructions  We will have you see Dr. Geronimo in consultation.  On 12 March at 10 AM, here at this office.  We will switch your inhaler to Pulmicort  (budesonide ) you will use this after the DuoNeb (ipratropium/albuterol )nebulizer.  When you come back to see Dr. Geronimo you will have some blood work done.  Please note: late entry documentation due to logistical difficulties during COVID-19 pandemic. This note is filed for information purposes only, and is not intended to be used for billing, nor does it represent the full scope/nature of the visit in question. Please see any associated scanned media linked to date of encounter for additional pertinent information.  Subjective:    HPI: [name] [name] is a 59 y.o. female presenting to the pulmonology clinic on 06/04/2019 with report of: Pulmonary Consult (Pt had COVID 12/20-01/21. SOB and wheezing on exertion. Non prod cough. Pt denies any fever or chills.)     Outpatient Encounter Medications as of 06/04/2019  Medication Sig Note   famotidine  (PEPCID ) 20 MG tablet Take 1 tablet (20 mg total) by mouth daily.    folic acid  (FOLVITE ) 1 MG tablet Take 1 mg by mouth daily.     levothyroxine  (SYNTHROID , LEVOTHROID) 88 MCG tablet Take 88 mcg by mouth daily before breakfast.    [DISCONTINUED] ALPRAZolam  (XANAX ) 0.5 MG tablet Take 1 tablet (0.5 mg total) by mouth 2 (two) times daily as needed for anxiety.    [DISCONTINUED] ARIPiprazole  (ABILIFY ) 5 MG tablet TAKE 1 TABLET BY MOUTH ONCE DAILY. (Patient taking differently: Take 5 mg by mouth daily. )    [DISCONTINUED] ascorbic acid  (VITAMIN C ) 500 MG tablet Take 1 tablet (500 mg total) by mouth daily. (Patient not taking: Reported on 11/17/2023)    [DISCONTINUED] atorvastatin  (LIPITOR )  80 MG tablet Take 80 mg by mouth daily at 6 PM.     [DISCONTINUED] benzonatate  (TESSALON  PERLES) 100 MG capsule Take 1 capsule (100 mg total) by mouth 3 (three) times daily as needed for cough.    [DISCONTINUED] busPIRone  (BUSPAR ) 10 MG tablet TAKE 1 TABLET BY MOUTH TWICE DAILY (Patient taking differently: Take 10 mg by mouth 2 (two) times daily. )    [DISCONTINUED] Calcium  Carb-Ergocalciferol  250-125 MG-UNIT TABS Take 1 tablet by mouth daily.     [DISCONTINUED] Cholecalciferol  (VITAMIN D3) 125 MCG (5000 UT) CAPS Take 1 capsule (5,000 Units total) by mouth daily with breakfast. Take along with calcium  and magnesium .    [DISCONTINUED] ciclesonide  (ALVESCO ) 160 MCG/ACT inhaler Inhale 4 puffs into the lungs 2 (two) times daily. (Patient not taking: Reported on 07/11/2019)    [DISCONTINUED] dexamethasone  (DECADRON ) 1 MG tablet Take 2 mg daily for 3days,Take 1mg  daily for 3 days, Take 1mg  every other day for 3days,then stop (Patient not taking: Reported on 07/11/2019)    [DISCONTINUED] Ensure Max Protein (ENSURE MAX PROTEIN) LIQD Take 330 mLs (11 oz total) by mouth 2 (two) times daily between meals.    [DISCONTINUED] FLUoxetine  (PROZAC ) 20 MG capsule Take 1 capsule (20 mg total) by mouth daily. TAKE (1) CAPSULE BY MOUTH ONCE DAILY. (Patient taking differently: Take 20 mg by mouth daily. )    [DISCONTINUED] furosemide  (LASIX ) 20 MG tablet Take 1 tablet (20 mg total) by mouth daily as needed for fluid or edema (weight gain >3 lbs in 1 day or >  5 lbs in 2 days).    [DISCONTINUED] Insulin  Degludec (TRESIBA  FLEXTOUCH) 200 UNIT/ML SOPN Inject 65-100 Units into the skin daily.    [DISCONTINUED] ipratropium-albuterol  (DUONEB) 0.5-2.5 (3) MG/3ML SOLN Take 3 mLs by nebulization 3 (three) times daily.    [DISCONTINUED] linagliptin  (TRADJENTA ) 5 MG TABS tablet Take 1 tablet (5 mg total) by mouth daily.    [DISCONTINUED] lisinopril  (PRINIVIL ,ZESTRIL ) 2.5 MG tablet Take 2.5 mg by mouth daily.    [DISCONTINUED] metFORMIN   (GLUCOPHAGE ) 500 MG tablet Take 500 mg by mouth 2 (two) times daily with a meal.     [DISCONTINUED] polyethylene glycol (MIRALAX  / GLYCOLAX ) 17 g packet Take 17 g by mouth daily as needed for mild constipation.    [DISCONTINUED] pramipexole  (MIRAPEX ) 0.125 MG tablet Take 0.25 mg by mouth at bedtime.     [DISCONTINUED] pregabalin  (LYRICA ) 150 MG capsule Take 1 capsule (150 mg total) by mouth 3 (three) times daily.    [DISCONTINUED] QUEtiapine  (SEROQUEL ) 300 MG tablet TAKE ONE TABLET BY MOUTH AT BEDTIME. (Patient taking differently: Take 300 mg by mouth at bedtime. TAKE ONE TABLET BY MOUTH AT BEDTIME.)    [DISCONTINUED] VESICARE  5 MG tablet Take 5 mg by mouth daily.    [DISCONTINUED] zinc  sulfate 220 (50 Zn) MG capsule Take 1 capsule (220 mg total) by mouth daily.    [DISCONTINUED] zolpidem  (AMBIEN ) 5 MG tablet Take 1 tablet (5 mg total) by mouth at bedtime. TAKE 1 TABLET BY MOUTH EVERYDAY AT BEDTIME (Patient taking differently: Take 5 mg by mouth at bedtime. )    [DISCONTINUED] budesonide  (PULMICORT ) 0.5 MG/2ML nebulizer solution Take 2 mLs (0.5 mg total) by nebulization 2 (two) times daily.    [DISCONTINUED] oxyCODONE  (OXY IR/ROXICODONE ) 5 MG immediate release tablet Take 1 tablet (5 mg total) by mouth every 6 (six) hours as needed for severe pain. Must last 30 days 03/27/2019: Has not started for this month   No facility-administered encounter medications on file as of 06/04/2019.      Objective:   Vitals:   06/04/19 1046  BP: 112/70  Pulse: 95  Temp: 97.7 F (36.5 C)  Height: 5' 6 (1.676 m)  Weight: (!) 310 lb 12.8 oz (141 kg)  SpO2: 99% Comment: on 4L O2  TempSrc: Temporal  BMI (Calculated): 50.19     Physical exam documentation is limited by delayed entry of information.

## 2019-06-13 NOTE — Unmapped (Signed)
This patient has been disenrolled from the [facility] specialty pharmacy services due to medication discontinuation (Actemra).  [name] can be reenrolled in specialty pharmacy services if/when a new prescription for a specialty medication is sent to the [facility].    [name] [name]  [facility] [employer] Specialty Pharmacist

## 2019-06-13 NOTE — Unmapped (Signed)
Pre call made.  No answer and no way to leave a message

## 2019-06-14 ENCOUNTER — Telehealth: Admit: 2019-06-14 | Discharge: 2019-06-15 | Payer: MEDICAID | Attending: Internal Medicine | Primary: Internal Medicine

## 2019-06-15 MED ORDER — ASCORBIC ACID (VITAMIN C) 500 MG TABLET
ORAL_TABLET | Freq: Every day | ORAL | 1 refills | 90.00000 days | Status: CP
Start: 2019-06-15 — End: 2020-06-14

## 2019-06-15 MED ORDER — FAMOTIDINE 20 MG TABLET
ORAL_TABLET | Freq: Every day | ORAL | 1 refills | 90.00000 days | Status: CP
Start: 2019-06-15 — End: 2020-06-14

## 2019-06-15 MED ORDER — ZINC SULFATE 220 MG (50 MG) CAPSULE
ORAL_CAPSULE | Freq: Every day | ORAL | 1 refills | 90.00000 days | Status: CP
Start: 2019-06-15 — End: 2020-06-14

## 2019-06-15 MED ORDER — TRADJENTA 5 MG TABLET
ORAL_TABLET | Freq: Every day | ORAL | 1 refills | 90.00000 days | Status: CP
Start: 2019-06-15 — End: 2020-06-14

## 2019-06-15 MED ORDER — CALCIUM CITRATE 315 MG CALCIUM-VITAMIN D3 6.25 MCG (250 UNIT) TABLET
ORAL | 0.00000 days
Start: 2019-06-15 — End: ?

## 2019-06-18 MED ORDER — METHOTREXATE SODIUM 2.5 MG TABLET
ORAL_TABLET | ORAL | 0 refills | 84 days | Status: CP
Start: 2019-06-18 — End: 2019-07-18

## 2019-06-21 MED ORDER — TRESIBA FLEXTOUCH U-200 INSULIN 200 UNIT/ML (3 ML) SUBCUTANEOUS PEN
3 refills | 0 days | Status: CP
Start: 2019-06-21 — End: ?

## 2019-06-29 ENCOUNTER — Ambulatory Visit (INDEPENDENT_AMBULATORY_CARE_PROVIDER_SITE_OTHER): Payer: Medicaid Other | Admitting: Internal Medicine

## 2019-06-29 ENCOUNTER — Other Ambulatory Visit: Payer: Self-pay

## 2019-06-29 ENCOUNTER — Encounter: Payer: Self-pay | Admitting: Internal Medicine

## 2019-06-29 VITALS — BP 116/72 | HR 77 | Temp 97.7°F | Ht 66.0 in | Wt 305.6 lb

## 2019-06-29 DIAGNOSIS — J8410 Pulmonary fibrosis, unspecified: Secondary | ICD-10-CM

## 2019-06-29 DIAGNOSIS — Z8739 Personal history of other diseases of the musculoskeletal system and connective tissue: Secondary | ICD-10-CM | POA: Diagnosis not present

## 2019-06-29 DIAGNOSIS — Z79899 Other long term (current) drug therapy: Secondary | ICD-10-CM | POA: Diagnosis not present

## 2019-06-29 DIAGNOSIS — Z79631 Long term (current) use of antimetabolite agent: Secondary | ICD-10-CM

## 2019-06-29 DIAGNOSIS — Z8616 Personal history of COVID-19: Secondary | ICD-10-CM | POA: Diagnosis not present

## 2019-06-29 DIAGNOSIS — R5381 Other malaise: Secondary | ICD-10-CM

## 2019-06-29 NOTE — Addendum Note (Signed)
Addended by: Lajoyce Lauber A on: 06/29/2019 10:43 AM   Modules accepted: Orders

## 2019-06-29 NOTE — Patient Instructions (Addendum)
ICD-10-CM   1. 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 Personal history of covid-19  Z86.16   3. Long term methotrexate user  [phone]   4. History of rheumatoid arthritis  Z87.39   5. Physical deconditioning  R53.81    You are on an improving course In the limited walking that he did is stop more because of physical deconditioning than actual low oxygen level However I think there is still some lung inflammation because the oxygen is showing a tendency to drop Because of underlying rheumatoid arthritis and long-term methotrexate use we have to keep an eye on your pulmonary inflammation and monitor it  Plan  -Continue nighttime oxygen and daytime oxygen as required -We will apply to the DME company for portable oxygen system [not sure if we will be lucky) -Refer to pulmonary rehabilitation -Do spirometry and DLCO in 2 months  Follow-up -[place] [facility] face-to-face visit with Dr. Marchelle Gearing in 2 months but after breathing test -Decide on timing of follow-up CT scan at follow-up

## 2019-06-29 NOTE — Progress Notes (Signed)
OV 06/29/2019-being seen by Dr. Marchelle Gearing at the ILD center in [facility]  Subjective:  Patient ID: [name] [name], female , DOB: 10-Oct-1960 , age 59 y.o. , MRN: [id number] , ADDRESS: [address] [place] [address]   06/29/2019 -   Chief Complaint  Patient presents with  . Follow-up    [facility] pt- pt reports of sob with exertion. on 4L cont.      HPI [name] [name] 59 y.o. -59 year old obese female with multiple medical problems as stated below.  Has a history of rheumatoid arthritis since 2012.  Being followed at [facility].  Been on methotrexate since then 10 mg once a week for the last 9 years.  In between has been on TNF alpha blockade per her history and review of the record.  Apparently discussed heart failure so she is not on it.  Then in December 2020 she was was hospitalized for COVID-19 that she contracted while at church.  She was in the hospital for a few weeks.  She was on high flow oxygen.  Never intubated.  And then discharged.  Since then she has been on a recovering course.  She was discharged on 4 L nasal cannula at rest.  She now uses 3 L nasal cannula at rest and overnight and 24/7.  This has not changed but she says she is feeling better.  In fact when we turned her oxygen off for 20 minutes and walker she did not desaturate below 88% but she stopped at 2 laps because of  shortness of breath.  There is no chest pain.  Dyspnea is the dominant feature right now.  Fatigue is improved cough is improved.  There are some mild anxiety.  She is asking for portable oxygen system.  Quick exposure history at home: There is no organic antigen exposure.  SYMPTOM SCALE - ILD 06/29/2019   O2 use 4L at rest -> 100%, RA ->   Shortness of Breath 0 -> 5 scale with 5 being worst (score 6 If unable to do)  At rest 0  Simple tasks - showers, clothes change, eating, shaving 3  Household (dishes, doing bed, laundry) 4  Shopping 3  Walking level at own pace 2  Walking up  Stairs 5  Total (30-36) Dyspnea Score 17  How bad is your cough? 0  How bad is your fatigue 1  How bad is nausea 0  How bad is vomiting?  0  How bad is diarrhea? 0  How bad is anxiety? 1  How bad is depression 1      Simple office walk 176 feet x  3 laps goal with forehead probe 06/29/2019   O2 used ra  Number laps completed 2 o 3 only  Comments about pace Slow and antalgic . No cane  Resting Pulse Ox/HR 93% and 73/min  Final Pulse Ox/HR 90% and 118/min  Desaturated </= 88% no  Desaturated <= 3% points Yes 3 points  Got Tachycardic >/= 90/min yes  Symptoms at end of test Very dyspneic  Miscellaneous comments prob decodnitined      ROS - per HPI     has a past medical history of Abdominal wall hernia (01/29/2013), Anxiety, Arthritis, C. difficile colitis, Chronic diastolic heart failure (HCC), Depression, Diabetes mellitus, Diastolic CHF (HCC), Esophagitis, Fluid retention, GERD (gastroesophageal reflux disease), Hiatal hernia, Hypertension, Hypokalemia due to loss of potassium (10/21/2015), Hypothyroidism, IBS (irritable bowel syndrome), Moderate episode of recurrent major depressive  disorder (HCC) (06/03/2004), Morbid obesity (HCC), MRSA (methicillin resistant Staphylococcus aureus) infection (11/2017), Neurogenic bladder, Neuropathy, Obesity, Panic attacks, Pneumonia due to COVID-19 virus, Rheumatoid arthritis (HCC), and Sleep apnea.   reports that she quit smoking about 19 years ago. Her smoking use included cigarettes. She has a 54.00 pack-year smoking history. She has never used smokeless tobacco.  Past Surgical History:  Procedure Laterality Date  . ABDOMINAL HYSTERECTOMY    . CHOLECYSTECTOMY    . DG GREAT TOE RIGHT FOOT  02/23/2018  . EYE SURGERY     bilateral cataract extraction with IOL  . HERNIA REPAIR     ventral hernia with strangulation  . LAPAROSCOPIC GASTRIC BANDING  03/20/07  . TONSILLECTOMY    . TUBAL LIGATION      Allergies  Allergen Reactions  .  Cephalexin Hives  . Codeine Palpitations, Nausea Only, Nausea And Vomiting, Rash and Shortness Of Breath    "makes heart fly, she gets flushed and passes out"  . Doxycycline Rash  . Propoxyphene Rash and Shortness Of Breath    Increase heart rate  . Sulfa Antibiotics Palpitations, Nausea Only, Shortness Of Breath and Hives    "makes heart fly, she gets flushed and passes out"  . Lovenox [Enoxaparin Sodium] Hives  . Hydrocodone Nausea And Vomiting    Hear racing & breaks out into a cold sweat.  . Meropenem Rash    Erythematous, hot, pruritic rash over arms, chest, back, abdomen, and face occurred at the end of meropenem infusion on 02/22/18    Immunization History  Administered Date(s) Administered  . Influenza, Seasonal, Injecte, Preservative Fre 02/04/2005  . Influenza,inj,Quad PF,6+ Mos 04/14/2015, 03/23/2017, 02/07/2018, 02/01/2019  . Influenza-Unspecified 02/02/2019  . Pneumococcal Conjugate-13 04/22/2014  . Pneumococcal Polysaccharide-23 03/23/2017, 04/16/2019    Family History  Problem Relation Age of Onset  . Heart failure Father   . Bipolar disorder Father   . Alcohol abuse Father   . Anxiety disorder Father   . Depression Father   . Heart disease Brother   . Heart attack Brother 49       MI s/p stents placed  . Anxiety disorder Sister   . Depression Sister   . Anxiety disorder Sister   . Depression Sister   . Bipolar disorder Sister   . Alcohol abuse Sister   . Drug abuse Sister   . Heart attack Brother      Current Outpatient Medications:  .  ALPRAZolam (XANAX) 0.5 MG tablet, Take 1 tablet (0.5 mg total) by mouth 2 (two) times daily as needed for anxiety., Disp: 10 tablet, Rfl: 0 .  ARIPiprazole (ABILIFY) 5 MG tablet, TAKE 1 TABLET BY MOUTH ONCE DAILY. (Patient taking differently: Take 5 mg by mouth daily. ), Disp: 28 tablet, Rfl: 11 .  ascorbic acid (VITAMIN C) 500 MG tablet, Take 1 tablet (500 mg total) by mouth daily., Disp: 30 tablet, Rfl: 0 .   atorvastatin (LIPITOR) 80 MG tablet, Take 80 mg by mouth daily at 6 PM. , Disp: , Rfl:  .  benzonatate (TESSALON PERLES) 100 MG capsule, Take 1 capsule (100 mg total) by mouth 3 (three) times daily as needed for cough., Disp: 30 capsule, Rfl: 0 .  budesonide (PULMICORT) 0.5 MG/2ML nebulizer solution, Take 2 mLs (0.5 mg total) by nebulization 2 (two) times daily., Disp: 120 mL, Rfl: 11 .  busPIRone (BUSPAR) 10 MG tablet, TAKE 1 TABLET BY MOUTH TWICE DAILY (Patient taking differently: Take 10 mg by mouth 2 (two) times daily. ),  Disp: 56 tablet, Rfl: 11 .  Calcium Carb-Ergocalciferol 250-125 MG-UNIT TABS, Take 1 tablet by mouth daily. , Disp: , Rfl:  .  Cholecalciferol (VITAMIN D3) 125 MCG (5000 UT) CAPS, Take 1 capsule (5,000 Units total) by mouth daily with breakfast. Take along with calcium and magnesium., Disp: 90 capsule, Rfl: 3 .  ciclesonide (ALVESCO) 160 MCG/ACT inhaler, Inhale 4 puffs into the lungs 2 (two) times daily., Disp: 1 Inhaler, Rfl: 0 .  dexamethasone (DECADRON) 1 MG tablet, Take 2 mg daily for 3days,Take 1mg  daily for 3 days, Take 1mg  every other day for 3days,then stop, Disp: 12 tablet, Rfl: 0 .  Ensure Max Protein (ENSURE MAX PROTEIN) LIQD, Take 330 mLs (11 oz total) by mouth 2 (two) times daily between meals., Disp: 10000 mL, Rfl: 0 .  famotidine (PEPCID) 20 MG tablet, Take 1 tablet (20 mg total) by mouth daily., Disp: 30 tablet, Rfl: 0 .  FLUoxetine (PROZAC) 20 MG capsule, Take 1 capsule (20 mg total) by mouth daily. TAKE (1) CAPSULE BY MOUTH ONCE DAILY., Disp: 30 capsule, Rfl: 6 .  folic acid (FOLVITE) 1 MG tablet, Take 1 mg by mouth daily. , Disp: , Rfl:  .  furosemide (LASIX) 20 MG tablet, Take 1 tablet (20 mg total) by mouth daily as needed for fluid or edema (weight gain >3 lbs in 1 day or >5 lbs in 2 days)., Disp: 30 tablet, Rfl: 0 .  Insulin Degludec (TRESIBA FLEXTOUCH) 200 UNIT/ML SOPN, Inject 65-100 Units into the skin daily., Disp: , Rfl:  .  ipratropium-albuterol  (DUONEB) 0.5-2.5 (3) MG/3ML SOLN, Take 3 mLs by nebulization 3 (three) times daily., Disp: 360 mL, Rfl: 0 .  levothyroxine (SYNTHROID, LEVOTHROID) 88 MCG tablet, Take 88 mcg by mouth daily before breakfast., Disp: , Rfl:  .  linagliptin (TRADJENTA) 5 MG TABS tablet, Take 1 tablet (5 mg total) by mouth daily., Disp: 30 tablet, Rfl: 0 .  lisinopril (PRINIVIL,ZESTRIL) 2.5 MG tablet, Take 2.5 mg by mouth daily., Disp: , Rfl:  .  polyethylene glycol (MIRALAX / GLYCOLAX) 17 g packet, Take 17 g by mouth daily as needed for mild constipation., Disp: 14 each, Rfl: 0 .  pramipexole (MIRAPEX) 0.125 MG tablet, Take 0.125 mg by mouth 2 (two) times daily., Disp: , Rfl:  .  pregabalin (LYRICA) 150 MG capsule, Take 1 capsule (150 mg total) by mouth 3 (three) times daily., Disp: 90 capsule, Rfl: 5 .  QUEtiapine (SEROQUEL) 300 MG tablet, TAKE ONE TABLET BY MOUTH AT BEDTIME. (Patient taking differently: Take 300 mg by mouth at bedtime. TAKE ONE TABLET BY MOUTH AT BEDTIME.), Disp: 28 tablet, Rfl: 11 .  VESICARE 5 MG tablet, Take 5 mg by mouth daily., Disp: , Rfl:  .  zinc sulfate 220 (50 Zn) MG capsule, Take 1 capsule (220 mg total) by mouth daily., Disp: 30 capsule, Rfl: 0 .  zolpidem (AMBIEN) 5 MG tablet, Take 1 tablet (5 mg total) by mouth at bedtime. TAKE 1 TABLET BY MOUTH EVERYDAY AT BEDTIME, Disp: 30 tablet, Rfl: 5 .  oxyCODONE (OXY IR/ROXICODONE) 5 MG immediate release tablet, Take 1 tablet (5 mg total) by mouth every 6 (six) hours as needed for severe pain. Must last 30 days, Disp: 120 tablet, Rfl: 0      Objective:   Vitals:   06/29/19 0957  BP: 116/72  Pulse: 77  Temp: 97.7 F (36.5 C)  TempSrc: Temporal  SpO2: 100%  Weight: (!) 305 lb 9.6 oz (138.6 kg)  Height: 5'  6" (1.676 m)    Estimated body mass index is 49.33 kg/m as calculated from the following:   Height as of this encounter: 5\' 6"  (1.676 m).   Weight as of this encounter: 305 lb 9.6 oz (138.6 kg).  @WEIGHTCHANGE @     06/29/19 0957  Weight: (!) 305 lb 9.6 oz (138.6 kg)     Physical Exam  General Appearance:    Alert, cooperative, no distress, appears stated age - yes , Deconditioned looking - mild yes , OBESE  - yes, Sitting on Wheelchair -  no  Head:    Normocephalic, without obvious abnormality, atraumatic  Eyes:    PERRL, conjunctiva/corneas clear,  Ears:    Normal TM's and external ear canals, both ears  Nose:   Nares normal, septum midline, mucosa normal, no drainage    or sinus tenderness. OXYGEN ON  - yes . Patient is @ 3L @ 100%   Throat:   Lips, mucosa, and tongue normal; teeth and gums normal. Cyanosis on lips - no  Neck:   Supple, symmetrical, trachea midline, no adenopathy;    thyroid:  no enlargement/tenderness/nodules; no carotid   bruit or JVD  Back:     Symmetric, no curvature, ROM normal, no CVA tenderness  Lungs:     Distress - no , Wheeze no, Barrell Chest - no, Purse lip breathing - no, Crackles - no   Chest Wall:    No tenderness or deformity.    Heart:    Regular rate and rhythm, S1 and S2 normal, no rub   or gallop, Murmur - no  Breast Exam:    NOT DONE  Abdomen:     Soft, non-tender, bowel sounds active all four quadrants,    no masses, no organomegaly. Visceral obesity - yes  Genitalia:   NOT DONE  Rectal:   NOT DONE  Extremities:   Extremities - normal, Has Cane - no, Clubbing - no, Edema - no  Pulses:   2+ and symmetric all extremities  Skin:   Stigmata of Connective Tissue Disease - STIGMATA of CONNECTIVE TISSUE DISEASE  - Distal digital fissuring (ie, "mechanic hands") - no - Distal digital tip ulceration - no -Inflammatory arthritis or polyarticular morning joint stiffness ?60 minutes - YES - Palmar telangiectasia - ni - Raynaud phenomenon - no - Unexplained digital edema - no - Unexplained fixed rash on the digital extensor surfaces (Gottron's sign) - no ... - Deformities of RA - MILD yes - Scleroderma  - no - Malar Rash -  no   Lymph nodes:   Cervical,  supraclavicular, and axillary nodes normal  Psychiatric:  Neurologic:   Pleasant - yes, Anxious - no, Flat affect - yes  CAm-ICU - neg, Alert and Oriented x 3 - yes, Moves all 4s - yes, Speech - normal, Cognition - intact           Assessment:       ICD-10-CM   1. 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 Personal history of covid-19  Z86.16   3. Long term methotrexate user  [phone]   4. History of rheumatoid arthritis  Z87.39   5. Physical deconditioning  R53.81        Plan:     Patient Instructions     ICD-10-CM   1. 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 Personal history of covid-19  Z86.16   3. Long term methotrexate user  [phone]   4. History  of rheumatoid arthritis  Z87.39   5. Physical deconditioning  R53.81    You are on an improving course In the limited walking that he did is stop more because of physical deconditioning than actual low oxygen level However I think there is still some lung inflammation because the oxygen is showing a tendency to drop Because of underlying rheumatoid arthritis and long-term methotrexate use we have to keep an eye on your pulmonary inflammation and monitor it  Plan  -Continue nighttime oxygen and daytime oxygen as required -We will apply to the DME company for portable oxygen system [not sure if we will be lucky) -Refer to pulmonary rehabilitation -Do spirometry and DLCO in 2 months  Follow-up -[place] [facility] face-to-face visit with Dr. Chase Caller in 2 months but after breathing test -Decide on timing of follow-up CT scan at follow-up   ( Level 05 visit: Estb 40-54 min    in  visit type: on-site physical face to visit  in total care time and counseling or/and coordination of care by this undersigned MD - Dr Brand Males. This includes one or more of the following on this same day 06/29/2019: pre-charting, chart review, note writing, documentation discussion of test results, diagnostic or  treatment recommendations, prognosis, risks and benefits of management options, instructions, education, compliance or risk-factor reduction. It excludes time spent by the [place] or office staff in the care of the patient. Actual time 40 min)   SIGNATURE    Dr. Brand Males, M.D., F.C.C.P,  Pulmonary and Critical Care Medicine Staff Physician, [place] Director - Interstitial Lung Disease  Program  Pulmonary [place] [facility] [place], [place], [address]  Pager: [phone], If no answer or between  15:00h - 7:00h: call [phone] Telephone: [phone]  10:35 AM 06/29/2019

## 2019-07-07 ENCOUNTER — Other Ambulatory Visit: Payer: Self-pay

## 2019-07-07 ENCOUNTER — Emergency Department: Payer: Medicaid Other

## 2019-07-07 ENCOUNTER — Inpatient Hospital Stay
Admission: EM | Admit: 2019-07-07 | Discharge: 2019-07-17 | DRG: 871 | Disposition: A | Discharge status: Home Health | Payer: Medicaid Other | Attending: Internal Medicine | Admitting: Internal Medicine

## 2019-07-07 DIAGNOSIS — R570 Cardiogenic shock: Secondary | ICD-10-CM | POA: Diagnosis present

## 2019-07-07 DIAGNOSIS — Z882 Allergy status to sulfonamides status: Secondary | ICD-10-CM

## 2019-07-07 DIAGNOSIS — E559 Vitamin D deficiency, unspecified: Secondary | ICD-10-CM

## 2019-07-07 DIAGNOSIS — I130 Hypertensive heart and chronic kidney disease with heart failure and stage 1 through stage 4 chronic kidney disease, or unspecified chronic kidney disease: Secondary | ICD-10-CM | POA: Diagnosis present

## 2019-07-07 DIAGNOSIS — E876 Hypokalemia: Secondary | ICD-10-CM | POA: Diagnosis present

## 2019-07-07 DIAGNOSIS — I5032 Chronic diastolic (congestive) heart failure: Secondary | ICD-10-CM | POA: Diagnosis present

## 2019-07-07 DIAGNOSIS — M069 Rheumatoid arthritis, unspecified: Secondary | ICD-10-CM | POA: Diagnosis present

## 2019-07-07 DIAGNOSIS — R7989 Other specified abnormal findings of blood chemistry: Secondary | ICD-10-CM

## 2019-07-07 DIAGNOSIS — E1122 Type 2 diabetes mellitus with diabetic chronic kidney disease: Secondary | ICD-10-CM | POA: Diagnosis present

## 2019-07-07 DIAGNOSIS — F32A Depression, unspecified: Secondary | ICD-10-CM

## 2019-07-07 DIAGNOSIS — Z813 Family history of other psychoactive substance abuse and dependence: Secondary | ICD-10-CM

## 2019-07-07 DIAGNOSIS — Z811 Family history of alcohol abuse and dependence: Secondary | ICD-10-CM

## 2019-07-07 DIAGNOSIS — G92 Toxic encephalopathy: Secondary | ICD-10-CM | POA: Diagnosis present

## 2019-07-07 DIAGNOSIS — M25511 Pain in right shoulder: Secondary | ICD-10-CM | POA: Diagnosis present

## 2019-07-07 DIAGNOSIS — E1142 Type 2 diabetes mellitus with diabetic polyneuropathy: Secondary | ICD-10-CM

## 2019-07-07 DIAGNOSIS — F329 Major depressive disorder, single episode, unspecified: Secondary | ICD-10-CM

## 2019-07-07 DIAGNOSIS — R29898 Other symptoms and signs involving the musculoskeletal system: Secondary | ICD-10-CM

## 2019-07-07 DIAGNOSIS — Z87891 Personal history of nicotine dependence: Secondary | ICD-10-CM

## 2019-07-07 DIAGNOSIS — K567 Ileus, unspecified: Secondary | ICD-10-CM | POA: Diagnosis present

## 2019-07-07 DIAGNOSIS — I959 Hypotension, unspecified: Secondary | ICD-10-CM | POA: Diagnosis present

## 2019-07-07 DIAGNOSIS — Z452 Encounter for adjustment and management of vascular access device: Secondary | ICD-10-CM

## 2019-07-07 DIAGNOSIS — J9811 Atelectasis: Secondary | ICD-10-CM | POA: Diagnosis present

## 2019-07-07 DIAGNOSIS — J9621 Acute and chronic respiratory failure with hypoxia: Secondary | ICD-10-CM | POA: Diagnosis present

## 2019-07-07 DIAGNOSIS — D649 Anemia, unspecified: Secondary | ICD-10-CM | POA: Diagnosis present

## 2019-07-07 DIAGNOSIS — N319 Neuromuscular dysfunction of bladder, unspecified: Secondary | ICD-10-CM | POA: Diagnosis present

## 2019-07-07 DIAGNOSIS — J440 Chronic obstructive pulmonary disease with acute lower respiratory infection: Secondary | ICD-10-CM | POA: Diagnosis present

## 2019-07-07 DIAGNOSIS — Z885 Allergy status to narcotic agent status: Secondary | ICD-10-CM

## 2019-07-07 DIAGNOSIS — K219 Gastro-esophageal reflux disease without esophagitis: Secondary | ICD-10-CM | POA: Diagnosis present

## 2019-07-07 DIAGNOSIS — E1140 Type 2 diabetes mellitus with diabetic neuropathy, unspecified: Secondary | ICD-10-CM | POA: Diagnosis present

## 2019-07-07 DIAGNOSIS — J449 Chronic obstructive pulmonary disease, unspecified: Secondary | ICD-10-CM | POA: Diagnosis present

## 2019-07-07 DIAGNOSIS — M25512 Pain in left shoulder: Secondary | ICD-10-CM | POA: Diagnosis present

## 2019-07-07 DIAGNOSIS — N12 Tubulo-interstitial nephritis, not specified as acute or chronic: Secondary | ICD-10-CM | POA: Diagnosis present

## 2019-07-07 DIAGNOSIS — Z79899 Other long term (current) drug therapy: Secondary | ICD-10-CM

## 2019-07-07 DIAGNOSIS — F410 Panic disorder [episodic paroxysmal anxiety] without agoraphobia: Secondary | ICD-10-CM | POA: Diagnosis present

## 2019-07-07 DIAGNOSIS — Z20822 Contact with and (suspected) exposure to covid-19: Secondary | ICD-10-CM | POA: Diagnosis present

## 2019-07-07 DIAGNOSIS — R109 Unspecified abdominal pain: Secondary | ICD-10-CM

## 2019-07-07 DIAGNOSIS — K439 Ventral hernia without obstruction or gangrene: Secondary | ICD-10-CM | POA: Diagnosis present

## 2019-07-07 DIAGNOSIS — Z9841 Cataract extraction status, right eye: Secondary | ICD-10-CM

## 2019-07-07 DIAGNOSIS — E039 Hypothyroidism, unspecified: Secondary | ICD-10-CM | POA: Diagnosis present

## 2019-07-07 DIAGNOSIS — Z881 Allergy status to other antibiotic agents status: Secondary | ICD-10-CM

## 2019-07-07 DIAGNOSIS — R7881 Bacteremia: Secondary | ICD-10-CM

## 2019-07-07 DIAGNOSIS — Z9842 Cataract extraction status, left eye: Secondary | ICD-10-CM

## 2019-07-07 DIAGNOSIS — E119 Type 2 diabetes mellitus without complications: Secondary | ICD-10-CM

## 2019-07-07 DIAGNOSIS — I480 Paroxysmal atrial fibrillation: Secondary | ICD-10-CM | POA: Diagnosis present

## 2019-07-07 DIAGNOSIS — Z8249 Family history of ischemic heart disease and other diseases of the circulatory system: Secondary | ICD-10-CM

## 2019-07-07 DIAGNOSIS — N183 Chronic kidney disease, stage 3 unspecified: Secondary | ICD-10-CM | POA: Diagnosis present

## 2019-07-07 DIAGNOSIS — Z888 Allergy status to other drugs, medicaments and biological substances status: Secondary | ICD-10-CM

## 2019-07-07 DIAGNOSIS — E1165 Type 2 diabetes mellitus with hyperglycemia: Secondary | ICD-10-CM | POA: Diagnosis present

## 2019-07-07 DIAGNOSIS — Z7951 Long term (current) use of inhaled steroids: Secondary | ICD-10-CM

## 2019-07-07 DIAGNOSIS — E785 Hyperlipidemia, unspecified: Secondary | ICD-10-CM | POA: Diagnosis present

## 2019-07-07 DIAGNOSIS — Z8616 Personal history of COVID-19: Secondary | ICD-10-CM

## 2019-07-07 DIAGNOSIS — Z818 Family history of other mental and behavioral disorders: Secondary | ICD-10-CM

## 2019-07-07 DIAGNOSIS — J189 Pneumonia, unspecified organism: Secondary | ICD-10-CM

## 2019-07-07 DIAGNOSIS — A4102 Sepsis due to Methicillin resistant Staphylococcus aureus: Principal | ICD-10-CM | POA: Diagnosis present

## 2019-07-07 DIAGNOSIS — G9341 Metabolic encephalopathy: Secondary | ICD-10-CM

## 2019-07-07 DIAGNOSIS — R6521 Severe sepsis with septic shock: Secondary | ICD-10-CM | POA: Diagnosis present

## 2019-07-07 DIAGNOSIS — Z794 Long term (current) use of insulin: Secondary | ICD-10-CM

## 2019-07-07 DIAGNOSIS — R197 Diarrhea, unspecified: Secondary | ICD-10-CM

## 2019-07-07 DIAGNOSIS — A419 Sepsis, unspecified organism: Secondary | ICD-10-CM | POA: Diagnosis present

## 2019-07-07 DIAGNOSIS — I38 Endocarditis, valve unspecified: Secondary | ICD-10-CM | POA: Diagnosis present

## 2019-07-07 DIAGNOSIS — G4733 Obstructive sleep apnea (adult) (pediatric): Secondary | ICD-10-CM | POA: Diagnosis present

## 2019-07-07 DIAGNOSIS — Z8614 Personal history of Methicillin resistant Staphylococcus aureus infection: Secondary | ICD-10-CM

## 2019-07-07 DIAGNOSIS — D735 Infarction of spleen: Secondary | ICD-10-CM | POA: Diagnosis present

## 2019-07-07 DIAGNOSIS — G894 Chronic pain syndrome: Secondary | ICD-10-CM | POA: Diagnosis present

## 2019-07-07 DIAGNOSIS — F603 Borderline personality disorder: Secondary | ICD-10-CM | POA: Diagnosis present

## 2019-07-07 DIAGNOSIS — Z6841 Body Mass Index (BMI) 40.0 and over, adult: Secondary | ICD-10-CM

## 2019-07-07 DIAGNOSIS — N179 Acute kidney failure, unspecified: Secondary | ICD-10-CM | POA: Diagnosis present

## 2019-07-07 DIAGNOSIS — M792 Neuralgia and neuritis, unspecified: Secondary | ICD-10-CM

## 2019-07-07 DIAGNOSIS — F418 Other specified anxiety disorders: Secondary | ICD-10-CM | POA: Diagnosis present

## 2019-07-07 DIAGNOSIS — I10 Essential (primary) hypertension: Secondary | ICD-10-CM | POA: Diagnosis present

## 2019-07-07 DIAGNOSIS — Z79890 Hormone replacement therapy (postmenopausal): Secondary | ICD-10-CM

## 2019-07-07 MED ORDER — VANCOMYCIN HCL IN DEXTROSE 1-5 GM/200ML-% IV SOLN
1000.0000 mg | Freq: Once | INTRAVENOUS | Status: AC
  Administered 2019-07-08: 1000 mg via INTRAVENOUS
  Filled 2019-07-07: qty 200

## 2019-07-07 MED ORDER — LACTATED RINGERS IV BOLUS (SEPSIS)
1000.0000 mL | Freq: Once | INTRAVENOUS | Status: AC
  Administered 2019-07-08: 1000 mL via INTRAVENOUS

## 2019-07-07 MED ORDER — METRONIDAZOLE IN NACL 5-0.79 MG/ML-% IV SOLN
500.0000 mg | Freq: Once | INTRAVENOUS | Status: AC
  Administered 2019-07-08: 500 mg via INTRAVENOUS
  Filled 2019-07-07: qty 100

## 2019-07-07 MED ORDER — SODIUM CHLORIDE 0.9 % IV SOLN
2.0000 g | Freq: Once | INTRAVENOUS | Status: AC
  Administered 2019-07-08: 2 g via INTRAVENOUS
  Filled 2019-07-07: qty 2

## 2019-07-07 MED ORDER — LACTATED RINGERS IV BOLUS (SEPSIS)
200.0000 mL | Freq: Once | INTRAVENOUS | Status: AC
  Administered 2019-07-08: 200 mL via INTRAVENOUS

## 2019-07-07 NOTE — ED Triage Notes (Signed)
Pt to the er for trouble breathing. Pt came by EMS on 4L. Pt states she is on 4L for covid but was on O2 prior to that. Pt is a poor historian. Pt states she does not know her medical problems. Pt keeps removing her oxygen. Pt states her symptoms started today. Pt has a lengthy medical hx.

## 2019-07-07 NOTE — ED Notes (Signed)
Pt brought in with [facility] for c/o generalized body aches. Pt has had Covid x 4 months and per EMS is non-compliant with follow up. Per EMS, pt's VS WDL. Pt is on 3L nasal canula since first being diagnosed with Covid. Per EM, pt maintained 98% RA when she removed O2.

## 2019-07-08 ENCOUNTER — Encounter: Payer: Self-pay | Admitting: Radiology

## 2019-07-08 ENCOUNTER — Inpatient Hospital Stay (HOSPITAL_COMMUNITY)
Admit: 2019-07-08 | Discharge: 2019-07-08 | Disposition: A | Discharge status: Home / Self Care | Payer: Medicaid Other | Attending: Critical Care Medicine | Admitting: Critical Care Medicine

## 2019-07-08 ENCOUNTER — Emergency Department: Payer: Medicaid Other

## 2019-07-08 ENCOUNTER — Inpatient Hospital Stay: Payer: Medicaid Other

## 2019-07-08 DIAGNOSIS — E1165 Type 2 diabetes mellitus with hyperglycemia: Secondary | ICD-10-CM | POA: Diagnosis present

## 2019-07-08 DIAGNOSIS — B9562 Methicillin resistant Staphylococcus aureus infection as the cause of diseases classified elsewhere: Secondary | ICD-10-CM | POA: Diagnosis not present

## 2019-07-08 DIAGNOSIS — G4733 Obstructive sleep apnea (adult) (pediatric): Secondary | ICD-10-CM | POA: Diagnosis not present

## 2019-07-08 DIAGNOSIS — R570 Cardiogenic shock: Secondary | ICD-10-CM | POA: Diagnosis present

## 2019-07-08 DIAGNOSIS — A419 Sepsis, unspecified organism: Secondary | ICD-10-CM

## 2019-07-08 DIAGNOSIS — R29898 Other symptoms and signs involving the musculoskeletal system: Secondary | ICD-10-CM | POA: Diagnosis not present

## 2019-07-08 DIAGNOSIS — M05711 Rheumatoid arthritis with rheumatoid factor of right shoulder without organ or systems involvement: Secondary | ICD-10-CM | POA: Diagnosis not present

## 2019-07-08 DIAGNOSIS — G92 Toxic encephalopathy: Secondary | ICD-10-CM | POA: Diagnosis present

## 2019-07-08 DIAGNOSIS — J449 Chronic obstructive pulmonary disease, unspecified: Secondary | ICD-10-CM | POA: Diagnosis present

## 2019-07-08 DIAGNOSIS — Z20822 Contact with and (suspected) exposure to covid-19: Secondary | ICD-10-CM | POA: Diagnosis present

## 2019-07-08 DIAGNOSIS — D649 Anemia, unspecified: Secondary | ICD-10-CM | POA: Diagnosis present

## 2019-07-08 DIAGNOSIS — R6521 Severe sepsis with septic shock: Secondary | ICD-10-CM | POA: Diagnosis present

## 2019-07-08 DIAGNOSIS — I959 Hypotension, unspecified: Secondary | ICD-10-CM | POA: Diagnosis present

## 2019-07-08 DIAGNOSIS — I38 Endocarditis, valve unspecified: Secondary | ICD-10-CM | POA: Diagnosis present

## 2019-07-08 DIAGNOSIS — M25512 Pain in left shoulder: Secondary | ICD-10-CM | POA: Diagnosis not present

## 2019-07-08 DIAGNOSIS — D735 Infarction of spleen: Secondary | ICD-10-CM | POA: Diagnosis present

## 2019-07-08 DIAGNOSIS — I130 Hypertensive heart and chronic kidney disease with heart failure and stage 1 through stage 4 chronic kidney disease, or unspecified chronic kidney disease: Secondary | ICD-10-CM | POA: Diagnosis present

## 2019-07-08 DIAGNOSIS — R112 Nausea with vomiting, unspecified: Secondary | ICD-10-CM | POA: Diagnosis not present

## 2019-07-08 DIAGNOSIS — E119 Type 2 diabetes mellitus without complications: Secondary | ICD-10-CM | POA: Diagnosis not present

## 2019-07-08 DIAGNOSIS — Z6841 Body Mass Index (BMI) 40.0 and over, adult: Secondary | ICD-10-CM

## 2019-07-08 DIAGNOSIS — I361 Nonrheumatic tricuspid (valve) insufficiency: Secondary | ICD-10-CM

## 2019-07-08 DIAGNOSIS — J9621 Acute and chronic respiratory failure with hypoxia: Secondary | ICD-10-CM | POA: Diagnosis present

## 2019-07-08 DIAGNOSIS — G9341 Metabolic encephalopathy: Secondary | ICD-10-CM | POA: Diagnosis not present

## 2019-07-08 DIAGNOSIS — A4102 Sepsis due to Methicillin resistant Staphylococcus aureus: Secondary | ICD-10-CM | POA: Diagnosis present

## 2019-07-08 DIAGNOSIS — E1140 Type 2 diabetes mellitus with diabetic neuropathy, unspecified: Secondary | ICD-10-CM | POA: Diagnosis present

## 2019-07-08 DIAGNOSIS — N179 Acute kidney failure, unspecified: Secondary | ICD-10-CM | POA: Diagnosis present

## 2019-07-08 DIAGNOSIS — N12 Tubulo-interstitial nephritis, not specified as acute or chronic: Secondary | ICD-10-CM | POA: Diagnosis present

## 2019-07-08 DIAGNOSIS — I5032 Chronic diastolic (congestive) heart failure: Secondary | ICD-10-CM | POA: Diagnosis present

## 2019-07-08 DIAGNOSIS — R7881 Bacteremia: Secondary | ICD-10-CM | POA: Diagnosis not present

## 2019-07-08 DIAGNOSIS — G894 Chronic pain syndrome: Secondary | ICD-10-CM | POA: Diagnosis not present

## 2019-07-08 DIAGNOSIS — I480 Paroxysmal atrial fibrillation: Secondary | ICD-10-CM | POA: Diagnosis present

## 2019-07-08 DIAGNOSIS — M069 Rheumatoid arthritis, unspecified: Secondary | ICD-10-CM | POA: Diagnosis not present

## 2019-07-08 DIAGNOSIS — E039 Hypothyroidism, unspecified: Secondary | ICD-10-CM | POA: Diagnosis present

## 2019-07-08 DIAGNOSIS — J440 Chronic obstructive pulmonary disease with acute lower respiratory infection: Secondary | ICD-10-CM | POA: Diagnosis present

## 2019-07-08 DIAGNOSIS — K567 Ileus, unspecified: Secondary | ICD-10-CM | POA: Diagnosis present

## 2019-07-08 DIAGNOSIS — J9811 Atelectasis: Secondary | ICD-10-CM | POA: Diagnosis present

## 2019-07-08 HISTORY — DX: Metabolic encephalopathy: G93.41

## 2019-07-08 LAB — URINALYSIS, COMPLETE (UACMP) WITH MICROSCOPIC
Bacteria, UA: NONE SEEN
Bilirubin Urine: NEGATIVE
Glucose, UA: >=500 mg/dL — AB
Hgb urine dipstick: NEGATIVE
Ketones, ur: NEGATIVE mg/dL
Nitrite: NEGATIVE
Protein, ur: NEGATIVE mg/dL
Specific Gravity, Urine: 1.014 (ref 1.005–1.030)
pH: 5 (ref 5.0–8.0)

## 2019-07-08 LAB — COMPREHENSIVE METABOLIC PANEL
ALT: 12 U/L (ref 0–44)
AST: 17 U/L (ref 15–41)
Albumin: 3.4 g/dL — ABNORMAL LOW (ref 3.5–5.0)
Alkaline Phosphatase: 87 U/L (ref 38–126)
Anion gap: 11 (ref 5–15)
BUN: 17 mg/dL (ref 6–20)
CO2: 26 mmol/L (ref 22–32)
Calcium: 9.1 mg/dL (ref 8.9–10.3)
Chloride: 97 mmol/L — ABNORMAL LOW (ref 98–111)
Creatinine, Ser: 0.9 mg/dL (ref 0.44–1.00)
GFR calc Af Amer: >60 mL/min (ref >60)
GFR calc non Af Amer: >60 mL/min (ref >60)
Glucose, Bld: 413 mg/dL — ABNORMAL HIGH (ref 70–99)
Potassium: 3.7 mmol/L (ref 3.5–5.1)
Sodium: 134 mmol/L — ABNORMAL LOW (ref 135–145)
Total Bilirubin: 0.9 mg/dL (ref 0.3–1.2)
Total Protein: 7.7 g/dL (ref 6.5–8.1)

## 2019-07-08 LAB — TRIGLYCERIDES: Triglycerides: 200 mg/dL — ABNORMAL HIGH (ref <150)

## 2019-07-08 LAB — CBC WITH DIFFERENTIAL/PLATELET
Abs Immature Granulocytes: 0.18 10*3/uL — ABNORMAL HIGH (ref 0.00–0.07)
Basophils Absolute: 0.1 10*3/uL (ref 0.0–0.1)
Basophils Relative: 0 %
Eosinophils Absolute: 0 10*3/uL (ref 0.0–0.5)
Eosinophils Relative: 0 %
HCT: 38.4 % (ref 36.0–46.0)
Hemoglobin: 11.8 g/dL — ABNORMAL LOW (ref 12.0–15.0)
Immature Granulocytes: 1 %
Lymphocytes Relative: 7 %
Lymphs Abs: 1.2 10*3/uL (ref 0.7–4.0)
MCH: 24.3 pg — ABNORMAL LOW (ref 26.0–34.0)
MCHC: 30.7 g/dL (ref 30.0–36.0)
MCV: 79 fL — ABNORMAL LOW (ref 80.0–100.0)
Monocytes Absolute: 0.4 10*3/uL (ref 0.1–1.0)
Monocytes Relative: 2 %
Neutro Abs: 15.9 10*3/uL — ABNORMAL HIGH (ref 1.7–7.7)
Neutrophils Relative %: 90 %
Platelets: 314 10*3/uL (ref 150–400)
RBC: 4.86 MIL/uL (ref 3.87–5.11)
RDW: 16 % — ABNORMAL HIGH (ref 11.5–15.5)
WBC: 17.7 10*3/uL — ABNORMAL HIGH (ref 4.0–10.5)
nRBC: 0 % (ref 0.0–0.2)

## 2019-07-08 LAB — PROTIME-INR
INR: 1.3 — ABNORMAL HIGH (ref 0.8–1.2)
Prothrombin Time: 15.6 seconds — ABNORMAL HIGH (ref 11.4–15.2)

## 2019-07-08 LAB — GLUCOSE, CAPILLARY
Glucose-Capillary: 339 mg/dL — ABNORMAL HIGH (ref 70–99)
Glucose-Capillary: 255 mg/dL — ABNORMAL HIGH (ref 70–99)
Glucose-Capillary: 285 mg/dL — ABNORMAL HIGH (ref 70–99)
Glucose-Capillary: 287 mg/dL — ABNORMAL HIGH (ref 70–99)

## 2019-07-08 LAB — BLOOD CULTURE ID PANEL (REFLEXED)

## 2019-07-08 LAB — CORTISOL-AM, BLOOD: Cortisol - AM: 16.4 ug/dL (ref 6.7–22.6)

## 2019-07-08 LAB — BASIC METABOLIC PANEL
Anion gap: 10 (ref 5–15)
BUN: 30 mg/dL — ABNORMAL HIGH (ref 6–20)
CO2: 23 mmol/L (ref 22–32)
Calcium: 7.9 mg/dL — ABNORMAL LOW (ref 8.9–10.3)
Chloride: 101 mmol/L (ref 98–111)
Creatinine, Ser: 1.39 mg/dL — ABNORMAL HIGH (ref 0.44–1.00)
GFR calc Af Amer: 49 mL/min — ABNORMAL LOW (ref >60)
GFR calc non Af Amer: 42 mL/min — ABNORMAL LOW (ref >60)
Glucose, Bld: 309 mg/dL — ABNORMAL HIGH (ref 70–99)
Potassium: 4 mmol/L (ref 3.5–5.1)
Sodium: 134 mmol/L — ABNORMAL LOW (ref 135–145)

## 2019-07-08 LAB — FIBRIN DERIVATIVES D-DIMER (ARMC ONLY): Fibrin derivatives D-dimer (ARMC): 1424 ng/mL (FEU) — ABNORMAL HIGH (ref 0.00–499.00)

## 2019-07-08 LAB — TROPONIN I (HIGH SENSITIVITY): Troponin I (High Sensitivity): 26 ng/L — ABNORMAL HIGH (ref <18)

## 2019-07-08 LAB — LACTIC ACID, PLASMA
Lactic Acid, Venous: 4.3 mmol/L (ref 0.5–1.9)
Lactic Acid, Venous: 5.1 mmol/L (ref 0.5–1.9)
Lactic Acid, Venous: 4.1 mmol/L (ref 0.5–1.9)

## 2019-07-08 LAB — PREGNANCY, URINE: Preg Test, Ur: NEGATIVE

## 2019-07-08 LAB — RESPIRATORY PANEL BY RT PCR (FLU A&B, COVID)
Influenza A by PCR: NEGATIVE
Influenza B by PCR: NEGATIVE
SARS Coronavirus 2 by RT PCR: NEGATIVE

## 2019-07-08 LAB — HEMOGLOBIN A1C
Hgb A1c MFr Bld: 11.6 % — ABNORMAL HIGH (ref 4.8–5.6)
Mean Plasma Glucose: 286.22 mg/dL

## 2019-07-08 LAB — MRSA PCR SCREENING: MRSA by PCR: POSITIVE — AB

## 2019-07-08 LAB — C-REACTIVE PROTEIN: CRP: 2.6 mg/dL — ABNORMAL HIGH (ref <1.0)

## 2019-07-08 LAB — ECHOCARDIOGRAM COMPLETE
Height: 70 in
Weight: 5600 oz

## 2019-07-08 LAB — LIPASE, BLOOD: Lipase: 36 U/L (ref 11–51)

## 2019-07-08 LAB — LACTATE DEHYDROGENASE: LDH: 168 U/L (ref 98–192)

## 2019-07-08 LAB — POC SARS CORONAVIRUS 2 AG: SARS Coronavirus 2 Ag: NEGATIVE

## 2019-07-08 LAB — BRAIN NATRIURETIC PEPTIDE: B Natriuretic Peptide: 47 pg/mL (ref 0.0–100.0)

## 2019-07-08 LAB — FIBRINOGEN
Fibrinogen: 575 mg/dL — ABNORMAL HIGH (ref 210–475)
Fibrinogen: 589 mg/dL — ABNORMAL HIGH (ref 210–475)

## 2019-07-08 LAB — PROCALCITONIN: Procalcitonin: 0.45 ng/mL

## 2019-07-08 LAB — APTT: aPTT: 91 seconds — ABNORMAL HIGH (ref 24–36)

## 2019-07-08 LAB — FERRITIN: Ferritin: 42 ng/mL (ref 11–307)

## 2019-07-08 MED ORDER — ONDANSETRON HCL 4 MG/2ML IJ SOLN
4.0000 mg | Freq: Once | INTRAMUSCULAR | Status: AC
  Administered 2019-07-08: 03:00:00 4 mg via INTRAVENOUS
  Filled 2019-07-08: qty 2

## 2019-07-08 MED ORDER — VASOPRESSIN 20 UNIT/ML IV SOLN
0.0400 [IU]/min | INTRAVENOUS | Status: DC
  Administered 2019-07-08: 0.03 [IU]/min via INTRAVENOUS
  Administered 2019-07-09 – 2019-07-10 (×3): 0.04 [IU]/min via INTRAVENOUS
  Filled 2019-07-08 (×3): qty 2

## 2019-07-08 MED ORDER — IBUPROFEN 800 MG PO TABS
800.0000 mg | ORAL_TABLET | Freq: Once | ORAL | Status: AC
  Administered 2019-07-08: 800 mg via ORAL
  Filled 2019-07-08: qty 1

## 2019-07-08 MED ORDER — VANCOMYCIN HCL 1250 MG/250ML IV SOLN
1250.0000 mg | Freq: Two times a day (BID) | INTRAVENOUS | Status: DC
  Administered 2019-07-08 – 2019-07-09 (×2): 1250 mg via INTRAVENOUS
  Filled 2019-07-08 (×2): qty 250

## 2019-07-08 MED ORDER — HYDROMORPHONE HCL 1 MG/ML IJ SOLN
INTRAMUSCULAR | Status: AC
  Administered 2019-07-08: 0.5 mg via INTRAVENOUS
  Filled 2019-07-08: qty 1

## 2019-07-08 MED ORDER — ALPRAZOLAM 0.5 MG PO TABS
0.5000 mg | ORAL_TABLET | Freq: Two times a day (BID) | ORAL | Status: DC | PRN
  Administered 2019-07-11 – 2019-07-12 (×2): 0.5 mg via ORAL
  Filled 2019-07-08 (×2): qty 1

## 2019-07-08 MED ORDER — HYDROCORTISONE NA SUCCINATE PF 100 MG IJ SOLR
50.0000 mg | Freq: Four times a day (QID) | INTRAMUSCULAR | Status: DC
  Administered 2019-07-08 – 2019-07-11 (×12): 50 mg via INTRAVENOUS
  Filled 2019-07-08 (×12): qty 2

## 2019-07-08 MED ORDER — MORPHINE SULFATE (PF) 2 MG/ML IV SOLN
INTRAVENOUS | Status: AC
  Administered 2019-07-08: 2 mg via INTRAVENOUS
  Filled 2019-07-08: qty 1

## 2019-07-08 MED ORDER — LORAZEPAM 2 MG/ML IJ SOLN
0.5000 mg | Freq: Once | INTRAMUSCULAR | Status: AC
  Administered 2019-07-08: 0.5 mg via INTRAVENOUS
  Filled 2019-07-08: qty 1

## 2019-07-08 MED ORDER — INSULIN ASPART 100 UNIT/ML ~~LOC~~ SOLN
0.0000 [IU] | Freq: Every day | SUBCUTANEOUS | Status: DC
  Administered 2019-07-08 – 2019-07-09 (×2): 3 [IU] via SUBCUTANEOUS
  Filled 2019-07-08 (×2): qty 1

## 2019-07-08 MED ORDER — HEPARIN BOLUS VIA INFUSION
4000.0000 [IU] | Freq: Once | INTRAVENOUS | Status: AC
  Administered 2019-07-08: 4000 [IU] via INTRAVENOUS
  Filled 2019-07-08: qty 4000

## 2019-07-08 MED ORDER — KETOROLAC TROMETHAMINE 15 MG/ML IJ SOLN
15.0000 mg | Freq: Once | INTRAMUSCULAR | Status: AC
  Administered 2019-07-08: 15 mg via INTRAVENOUS
  Filled 2019-07-08: qty 1

## 2019-07-08 MED ORDER — HYDROMORPHONE HCL 1 MG/ML IJ SOLN
0.5000 mg | INTRAMUSCULAR | Status: DC | PRN
  Administered 2019-07-08 – 2019-07-17 (×23): 0.5 mg via INTRAVENOUS
  Filled 2019-07-08 (×8): qty 1
  Filled 2019-07-08: qty 0.5
  Filled 2019-07-08 (×10): qty 1
  Filled 2019-07-08: qty 0.5
  Filled 2019-07-08 (×2): qty 1
  Filled 2019-07-08: qty 0.5
  Filled 2019-07-08 (×2): qty 1

## 2019-07-08 MED ORDER — EPINEPHRINE HCL 5 MG/250ML IV SOLN IN NS
0.5000 ug/min | INTRAVENOUS | Status: DC
  Filled 2019-07-08: qty 250

## 2019-07-08 MED ORDER — HYDROMORPHONE HCL 1 MG/ML IJ SOLN
0.5000 mg | INTRAMUSCULAR | Status: AC
  Administered 2019-07-08: 0.5 mg via INTRAVENOUS

## 2019-07-08 MED ORDER — IOHEXOL 350 MG/ML SOLN
100.0000 mL | Freq: Once | INTRAVENOUS | Status: AC | PRN
  Administered 2019-07-08: 100 mL via INTRAVENOUS

## 2019-07-08 MED ORDER — LEVOTHYROXINE SODIUM 88 MCG PO TABS
88.0000 ug | ORAL_TABLET | Freq: Every day | ORAL | Status: DC
  Administered 2019-07-12 – 2019-07-17 (×4): 88 ug via ORAL
  Filled 2019-07-08 (×10): qty 1

## 2019-07-08 MED ORDER — MORPHINE SULFATE (PF) 2 MG/ML IV SOLN
2.0000 mg | INTRAVENOUS | Status: AC
  Administered 2019-07-08: 2 mg via INTRAVENOUS

## 2019-07-08 MED ORDER — ACETAMINOPHEN 650 MG RE SUPP: 650.0000 mg | Freq: Four times a day (QID) | RECTAL | Status: DC | PRN

## 2019-07-08 MED ORDER — LACTATED RINGERS IV BOLUS
1000.0000 mL | Freq: Once | INTRAVENOUS | Status: AC
  Administered 2019-07-08: 1000 mL via INTRAVENOUS

## 2019-07-08 MED ORDER — PRAMIPEXOLE DIHYDROCHLORIDE 0.25 MG PO TABS
0.1250 mg | ORAL_TABLET | Freq: Two times a day (BID) | ORAL | Status: DC
  Administered 2019-07-11 – 2019-07-17 (×11): 0.125 mg via ORAL
  Filled 2019-07-08: qty 1
  Filled 2019-07-08 (×18): qty 0.5
  Filled 2019-07-08: qty 1

## 2019-07-08 MED ORDER — ALBUMIN HUMAN 25 % IV SOLN
12.5000 g | Freq: Every day | INTRAVENOUS | Status: DC
  Administered 2019-07-08: 12.5 g via INTRAVENOUS
  Filled 2019-07-08: qty 50

## 2019-07-08 MED ORDER — FENTANYL CITRATE (PF) 100 MCG/2ML IJ SOLN
12.5000 ug | INTRAMUSCULAR | Status: DC | PRN
  Administered 2019-07-08: 25 ug via INTRAVENOUS
  Filled 2019-07-08: qty 2

## 2019-07-08 MED ORDER — HEPARIN (PORCINE) 25000 UT/250ML-% IV SOLN
1600.0000 [IU]/h | INTRAVENOUS | Status: DC
  Administered 2019-07-08: 1600 [IU]/h via INTRAVENOUS
  Filled 2019-07-08: qty 250

## 2019-07-08 MED ORDER — ACETAMINOPHEN 650 MG RE SUPP
650.0000 mg | Freq: Once | RECTAL | Status: AC
  Administered 2019-07-08: 650 mg via RECTAL

## 2019-07-08 MED ORDER — VANCOMYCIN HCL IN DEXTROSE 1-5 GM/200ML-% IV SOLN
1000.0000 mg | Freq: Once | INTRAVENOUS | Status: AC
  Administered 2019-07-08: 1000 mg via INTRAVENOUS
  Filled 2019-07-08: qty 200

## 2019-07-08 MED ORDER — ENOXAPARIN SODIUM 40 MG/0.4ML ~~LOC~~ SOLN
40.0000 mg | Freq: Two times a day (BID) | SUBCUTANEOUS | Status: DC
  Administered 2019-07-08: 40 mg via SUBCUTANEOUS
  Filled 2019-07-08: qty 0.4

## 2019-07-08 MED ORDER — NOREPINEPHRINE 4 MG/250ML-% IV SOLN: 0.0000 ug/min | INTRAVENOUS | Status: DC

## 2019-07-08 MED ORDER — CIPROFLOXACIN IN D5W 400 MG/200ML IV SOLN
400.0000 mg | Freq: Two times a day (BID) | INTRAVENOUS | Status: DC
  Administered 2019-07-08 – 2019-07-09 (×2): 400 mg via INTRAVENOUS
  Filled 2019-07-08 (×4): qty 200

## 2019-07-08 MED ORDER — NOREPINEPHRINE 16 MG/250ML-% IV SOLN
0.0000 ug/min | INTRAVENOUS | Status: DC
  Administered 2019-07-08: 32 ug/min via INTRAVENOUS
  Filled 2019-07-08 (×3): qty 250

## 2019-07-08 MED ORDER — SODIUM CHLORIDE 0.9 % IV SOLN
2.0000 g | Freq: Three times a day (TID) | INTRAVENOUS | Status: DC
  Administered 2019-07-08: 2 g via INTRAVENOUS
  Filled 2019-07-08 (×3): qty 2

## 2019-07-08 MED ORDER — NOREPINEPHRINE 4 MG/250ML-% IV SOLN
2.0000 ug/min | INTRAVENOUS | Status: DC
  Administered 2019-07-08: 2 ug/min via INTRAVENOUS
  Filled 2019-07-08: qty 250

## 2019-07-08 MED ORDER — SODIUM CHLORIDE 0.9 % IV SOLN
2.0000 g | Freq: Three times a day (TID) | INTRAVENOUS | Status: DC
  Filled 2019-07-08 (×2): qty 2

## 2019-07-08 MED ORDER — SODIUM CHLORIDE 0.9 % IV SOLN
250.0000 mL | INTRAVENOUS | Status: DC
  Administered 2019-07-08 – 2019-07-11 (×2): 250 mL via INTRAVENOUS
  Administered 2019-07-16: 1000 mL via INTRAVENOUS

## 2019-07-08 MED ORDER — INSULIN ASPART 100 UNIT/ML ~~LOC~~ SOLN
0.0000 [IU] | Freq: Three times a day (TID) | SUBCUTANEOUS | Status: DC
  Administered 2019-07-08: 11 [IU] via SUBCUTANEOUS
  Administered 2019-07-08: 15 [IU] via SUBCUTANEOUS
  Administered 2019-07-08: 11 [IU] via SUBCUTANEOUS
  Administered 2019-07-09 (×2): 20 [IU] via SUBCUTANEOUS
  Administered 2019-07-10: 7 [IU] via SUBCUTANEOUS
  Filled 2019-07-08 (×6): qty 1

## 2019-07-08 MED ORDER — CHLORHEXIDINE GLUCONATE CLOTH 2 % EX PADS
6.0000 | MEDICATED_PAD | Freq: Every day | CUTANEOUS | Status: DC
  Administered 2019-07-08 – 2019-07-11 (×4): 6 via TOPICAL

## 2019-07-08 MED ORDER — LACTATED RINGERS IV BOLUS (SEPSIS)
250.0000 mL | Freq: Once | INTRAVENOUS | Status: AC
  Administered 2019-07-08: 250 mL via INTRAVENOUS

## 2019-07-08 MED ORDER — ACETAMINOPHEN 325 MG PO TABS
650.0000 mg | ORAL_TABLET | Freq: Four times a day (QID) | ORAL | Status: DC | PRN
  Administered 2019-07-08: 650 mg via ORAL
  Filled 2019-07-08: qty 2

## 2019-07-08 MED ORDER — INSULIN GLARGINE 100 UNIT/ML ~~LOC~~ SOLN
10.0000 [IU] | Freq: Every day | SUBCUTANEOUS | Status: DC
  Administered 2019-07-08: 10 [IU] via SUBCUTANEOUS
  Filled 2019-07-08 (×2): qty 0.1

## 2019-07-08 MED ORDER — BUSPIRONE HCL 10 MG PO TABS
10.0000 mg | ORAL_TABLET | Freq: Two times a day (BID) | ORAL | Status: DC
  Administered 2019-07-11 – 2019-07-17 (×11): 10 mg via ORAL
  Filled 2019-07-08 (×20): qty 1

## 2019-07-08 NOTE — ED Provider Notes (Signed)
[facility] Emergency Department Provider Note  ____________________________________________   First MD Initiated Contact with Patient 07/07/19 2349     (approximate)  I have reviewed the triage vital signs and the nursing notes.   HISTORY  Chief Complaint Shortness of Breath  Level 5 caveat:  history/ROS limited by acute/critical illness  HPI [name] [name] is a 59 y.o. female with extensive chronic medical history as listed below who presents by EMS for evaluation of difficulty breathing and abdominal pain.  She is only able to give a limited history apparently due to acute illness.  She believes that her symptoms started today when I asked her what brought her and, she said "my stomach hurts".  I asked her if she was also short of breath and she said yes.  However she answers in the affirmative to most questions that are asked, including contradictory questions about what makes things better or worse and where her pain is the worst.  She was febrile, tachycardic, and tachypneic upon arrival to the emergency department.  She says that she was on oxygen prior to getting COVID-19 about 3 months ago but she does not know why she was on the oxygen.  She will not wear her oxygen tonight.         Past Medical History:  Diagnosis Date  . Abdominal wall hernia 01/29/2013  . Anxiety   . Arthritis    Rheumatoid  . C. difficile colitis   . Chronic diastolic heart failure (HCC)   . COVID-19 03/23/2019   Diagnosed at [facility] (send-out) on 03/23/2019  . Depression   . Diabetes mellitus    states no meds or diet restrictions  at present  . Diastolic CHF (HCC)   . Esophagitis   . Fluid retention   . GERD (gastroesophageal reflux disease)   . Hiatal hernia   . Hypertension   . Hypokalemia due to loss of potassium 10/21/2015   Overview:  Associated with 3 weeks of diarrhea  And QT prolongation.  . Hypothyroidism   . IBS (irritable bowel syndrome)   . Moderate  episode of recurrent major depressive disorder (HCC) 06/03/2004  . Morbid obesity (HCC)   . MRSA (methicillin resistant Staphylococcus aureus) infection 11/2017   left inner thigh abcess  . Neurogenic bladder    has pacemaker  . Neuropathy   . Obesity   . Panic attacks   . Pneumonia due to COVID-19 virus   . Rheumatoid arthritis (HCC)   . Sleep apnea    STATES SEVERE, CANT TOLERATE MASK- LAST STUDY YEARS AGO    Patient Active Problem List   Diagnosis Date Noted  . Acute metabolic encephalopathy 07/08/2019  . Hyperglycemia due to type 2 diabetes mellitus (HCC) 07/08/2019  . Morbid obesity with BMI of 50.0-59.9, adult (HCC) 07/08/2019  . History of COVID-19 07/08/2019  . Severe sepsis with septic shock (HCC) 07/08/2019  . Septic shock (HCC) 07/08/2019  . Acute respiratory disease due to COVID-19 virus 04/06/2019  . Type II diabetes mellitus with renal manifestations (HCC) 04/06/2019  . CKD (chronic kidney disease), stage III 04/06/2019  . HLD (hyperlipidemia) 04/06/2019  . COVID-19 03/27/2019  . Pneumonia due to COVID-19 virus 03/27/2019  . Abdominal pain 02/12/2019  . AKI (acute kidney injury) (HCC) 02/12/2019  . Cellulitis, abdominal wall 02/04/2019  . Elevated C-reactive protein (CRP) 12/15/2018  . Elevated sed rate 12/15/2018  . Pharmacologic therapy 11/06/2018  . Disorder of skeletal system 11/06/2018  . Problems influencing health  status 11/06/2018  . Decubitus ulcer of heel, bilateral 02/21/2018  . Diabetic ulcer of toe of right foot associated with type 2 diabetes mellitus (HCC) 02/14/2018  . Ulcer of left heel and midfoot with fat layer exposed (HCC) 02/14/2018  . MRSA (methicillin resistant Staphylococcus aureus) infection 11/17/2017  . Cellulitis 11/15/2017  . Perineal abscess 11/15/2017  . Subacute vulvitis 11/01/2017  . Chronic cystitis 03/01/2017  . Chronic pain syndrome 03/31/2016  . Insomnia secondary to chronic pain 03/31/2016  . Chronic upper back pain  12/25/2015  . Chronic hand pain (Bilateral) (L>R) 12/25/2015  . Rheumatoid arthritis (HCC) 12/25/2015  . Osteoarthritis, multiple sites 12/24/2015  . Chronic foot pain (Primary Area of Pain) (Bilateral) (L>R) 12/24/2015  . Chronic elbow pain (Third area of Pain) (Bilateral) (L>R) 12/24/2015  . Chronic shoulder pain (Bilateral) (L>R) 12/24/2015  . Chronic neck pain (Bilateral) (R>L) 12/24/2015  . 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[employer]) 12/23/2015  . Chronic knee pain (Bilateral) (R>L) 12/23/2015  . Long term current use of opiate analgesic 12/23/2015  . Long term prescription opiate use 12/23/2015  . Opiate use (30 MME/Day) 12/23/2015  . Neurogenic pain 12/23/2015  . Neuropathic pain 12/23/2015  . Diabetic peripheral neuropathy (HCC) 12/23/2015  . Encounter for therapeutic drug level monitoring 12/23/2015  . Encounter for pain management planning 12/23/2015  . GERD (gastroesophageal reflux disease) 11/25/2015  . Neuropathy 11/25/2015  . Diarrhea 10/22/2015  . Hypokalemia 10/21/2015  . Hypomagnesemia 10/21/2015  . QT prolongation 10/21/2015  . 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 Chronic wrist pain (Secondary area of Pain) (Bilateral) (L>R) 03/19/2015  . 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 Female genuine stress incontinence 02/14/2015  . Urge incontinence of urine 02/14/2015  . Obstructive apnea 07/03/2014  . Chronic diastolic CHF (congestive heart failure) (HCC) 07/03/2014  . Anxiety 01/03/2014  . Diabetic ulcer of heel (HCC) 01/03/2014  . COPD (chronic obstructive pulmonary disease) (HCC) 01/03/2014  . Bipolar disorder, unspecified (HCC) 01/03/2014  . Diastolic dysfunction 01/03/2014  . Combined fat and carbohydrate induced hyperlipemia 01/03/2014  . Shortness of breath 01/03/2014  . Acute on chronic congestive heart failure (HCC) 01/03/2014  . Incomplete bladder emptying 11/02/2012  . Bladder retention 10/10/2012  . Detrusor  muscle hypertonia 10/04/2012  . Obstruction of urinary tract 10/04/2012  . FOM (frequency of micturition) 10/04/2012  . Mixed incontinence 10/04/2012  . Vitamin D insufficiency 04/15/2012  . Borderline personality disorder (HCC) 01/06/2012  . Mixed anxiety depressive disorder 01/06/2012  . History of laparoscopic adjustable gastric banding, 03/20/2007.  Removed 09/19/2011. 08/04/2011  . Nausea & vomiting 08/04/2011  . Hypothyroidism 06/28/2010  . Rheumatoid arthritis 06/28/2010  . Obesity 03/29/2005  . Essential (primary) hypertension 01/27/2005  . Major depressive disorder, recurrent episode, moderate (HCC) 06/03/2004    Past Surgical History:  Procedure Laterality Date  . ABDOMINAL HYSTERECTOMY    . CHOLECYSTECTOMY    . DG GREAT TOE RIGHT FOOT  02/23/2018  . EYE SURGERY     bilateral cataract extraction with IOL  . HERNIA REPAIR     ventral hernia with strangulation  . LAPAROSCOPIC GASTRIC BANDING  03/20/07  . TONSILLECTOMY    . TUBAL LIGATION      Prior to Admission medications   Medication Sig Start Date End Date Taking? Authorizing Provider  ALPRAZolam Prudy Feeler) 0.5 MG tablet Take 1 tablet (0.5 mg total) by mouth 2 (two) times daily as needed for anxiety. 05/04/19   Rolly Salter, MD  ARIPiprazole (ABILIFY) 5 MG  tablet TAKE 1 TABLET BY MOUTH ONCE DAILY. Patient taking differently: Take 5 mg by mouth daily.  02/22/19   Clapacs, Jackquline Denmark, MD  ascorbic acid (VITAMIN C) 500 MG tablet Take 1 tablet (500 mg total) by mouth daily. 05/05/19   Rolly Salter, MD  atorvastatin (LIPITOR) 80 MG tablet Take 80 mg by mouth daily at 6 PM.  02/09/13   [provider]  benzonatate (TESSALON PERLES) 100 MG capsule Take 1 capsule (100 mg total) by mouth 3 (three) times daily as needed for cough. 05/23/19 05/22/20  Willy Eddy, MD  budesonide (PULMICORT) 0.5 MG/2ML nebulizer solution Take 2 mLs (0.5 mg total) by nebulization 2 (two) times daily. 06/04/19 06/03/20  Salena Saner, MD    busPIRone (BUSPAR) 10 MG tablet TAKE 1 TABLET BY MOUTH TWICE DAILY Patient taking differently: Take 10 mg by mouth 2 (two) times daily.  02/05/19   Clapacs, Jackquline Denmark, MD  Calcium Carb-Ergocalciferol 250-125 MG-UNIT TABS Take 1 tablet by mouth daily.     [provider]  Cholecalciferol (VITAMIN D3) 125 MCG (5000 UT) CAPS Take 1 capsule (5,000 Units total) by mouth daily with breakfast. Take along with calcium and magnesium. 02/14/19 02/14/20  Delano Metz, MD  ciclesonide (ALVESCO) 160 MCG/ACT inhaler Inhale 4 puffs into the lungs 2 (two) times daily. 05/04/19   Rolly Salter, MD  dexamethasone (DECADRON) 1 MG tablet Take 2 mg daily for 3days,Take  daily for 3 days, Take  every other day for 3days,then stop 05/04/19   Rolly Salter, MD  Ensure Max Protein (ENSURE MAX PROTEIN) LIQD Take 330 mLs (11 oz total) by mouth 2 (two) times daily between meals. 05/04/19   Rolly Salter, MD  famotidine (PEPCID) 20 MG tablet Take 1 tablet (20 mg total) by mouth daily. 05/05/19   Rolly Salter, MD  FLUoxetine (PROZAC) 20 MG capsule Take 1 capsule (20 mg total) by mouth daily. TAKE (1) CAPSULE BY MOUTH ONCE DAILY. 05/24/19   Clapacs, Jackquline Denmark, MD  folic acid (FOLVITE) 1 MG tablet Take 1 mg by mouth daily.     [provider]  furosemide (LASIX) 20 MG tablet Take 1 tablet (20 mg total) by mouth daily as needed for fluid or edema (weight gain >3 lbs in 1 day or >5 lbs in 2 days). 05/04/19 05/03/20  Rolly Salter, MD  Insulin Degludec (TRESIBA FLEXTOUCH) 200 UNIT/ML SOPN Inject 65-100 Units into the skin daily.    [provider]  ipratropium-albuterol (DUONEB) 0.5-2.5 (3) MG/3ML SOLN Take 3 mLs by nebulization 3 (three) times daily. 05/04/19   Rolly Salter, MD  levothyroxine (SYNTHROID, LEVOTHROID) 88 MCG tablet Take 88 mcg by mouth daily before breakfast.    [provider]  linagliptin (TRADJENTA) 5 MG TABS tablet Take 1 tablet (5 mg total) by mouth daily. 05/05/19    Rolly Salter, MD  lisinopril (PRINIVIL,ZESTRIL) 2.5 MG tablet Take 2.5 mg by mouth daily.    [provider]  oxyCODONE (OXY IR/ROXICODONE) 5 MG immediate release tablet Take 1 tablet (5 mg total) by mouth every 6 (six) hours as needed for severe pain. Must last 30 days 04/15/19 05/15/19  Delano Metz, MD  polyethylene glycol (MIRALAX / GLYCOLAX) 17 g packet Take 17 g by mouth daily as needed for mild constipation. 05/04/19   Rolly Salter, MD  pramipexole (MIRAPEX) 0.125 MG tablet Take 0.125 mg by mouth 2 (two) times daily. 03/08/19   [provider]  pregabalin (LYRICA) 150 MG capsule Take 1 capsule (150 mg total) by mouth 3 (three) times daily. 02/14/19 08/13/19  Delano Metz, MD  QUEtiapine (SEROQUEL) 300 MG tablet TAKE ONE TABLET BY MOUTH AT BEDTIME. Patient taking differently: Take 300 mg by mouth at bedtime. TAKE ONE TABLET BY MOUTH AT BEDTIME. 02/05/19   Clapacs, Jackquline Denmark, MD  VESICARE 5 MG tablet Take 5 mg by mouth daily. 03/16/19   [provider]  zinc sulfate 220 (50 Zn) MG capsule Take 1 capsule (220 mg total) by mouth daily. 05/05/19   Rolly Salter, MD  zolpidem (AMBIEN) 5 MG tablet Take 1 tablet (5 mg total) by mouth at bedtime. TAKE 1 TABLET BY MOUTH EVERYDAY AT BEDTIME 05/24/19   Clapacs, Jackquline Denmark, MD    Allergies Cephalexin, Codeine, Doxycycline, Propoxyphene, Sulfa antibiotics, Lovenox [enoxaparin sodium], Hydrocodone, and Meropenem  Family History  Problem Relation Age of Onset  . Heart failure Father   . Bipolar disorder Father   . Alcohol abuse Father   . Anxiety disorder Father   . Depression Father   . Heart disease Brother   . Heart attack Brother 43       MI s/p stents placed  . Anxiety disorder Sister   . Depression Sister   . Anxiety disorder Sister   . Depression Sister   . Bipolar disorder Sister   . Alcohol abuse Sister   . Drug abuse Sister   . Heart attack Brother     Social History Social History   Tobacco  Use  . Smoking status: Former Smoker    Packs/day: 2.00    Years: 27.00    Pack years: 54.00    Types: Cigarettes    Quit date: 07/30/1999    Years since quitting: 19.9  . Smokeless tobacco: Never Used  Substance Use Topics  . Alcohol use: No  . Drug use: No    Review of Systems Level 5 caveat:  history/ROS limited by acute/critical illness   ____________________________________________   PHYSICAL EXAM:  VITAL SIGNS: ED Triage Vitals  Enc Vitals Group     BP 07/07/19 2309 (!) 144/80     Pulse Rate 07/07/19 2309 (!) 125     Resp 07/07/19 2309 (!) 24     Temp 07/07/19 2309 (!) 101.1 F (38.4 C)     Temp Source 07/07/19 2309 Oral     SpO2 07/07/19 2309 98 %     Weight 07/07/19 2310 (!) 158.8 kg (350 lb)     Height 07/07/19 2310 1.778 m (5\' 10" )     Head Circumference --      Peak Flow --      Pain Score 07/07/19 2310 10     Pain Loc --      Pain Edu? --      Excl. in GC? --     Constitutional: Alert but disoriented.  Appears acutely ill. Eyes: Conjunctivae are normal.  Head: Atraumatic. Nose: No congestion/rhinnorhea. Mouth/Throat: Patient is wearing a mask. Neck: No stridor.  No meningeal signs.   Cardiovascular: Moderate tachycardia. Good peripheral circulation. Grossly normal heart sounds. Respiratory: Increased respiratory rate but without any obvious abnormal breath sounds though the exam is limited by body habitus. Gastrointestinal: Morbid obesity.  Soft.  Moderate tenderness to palpation throughout the abdomen, seemingly worse in the lower abdomen.  No clear tenderness to palpation of the upper abdomen but exam is limited by habitus. Musculoskeletal: No lower extremity tenderness nor edema. No gross deformities of  extremities.  No obvious evidence of cellulitis on lower extremities. Neurologic:  Normal speech and language. No gross focal neurologic deficits are appreciated.  However the patient is not able to follow commands well for an extensive exam. Skin:   Skin is warm, dry and intact.   ____________________________________________   LABS (all labs ordered are listed, but only abnormal results are displayed)  Labs Reviewed  LACTIC ACID, PLASMA - Abnormal; Notable for the following components:      Result Value   Lactic Acid, Venous 4.3 (*)    All other components within normal limits  LACTIC ACID, PLASMA - Abnormal; Notable for the following components:   Lactic Acid, Venous 5.1 (*)    All other components within normal limits  COMPREHENSIVE METABOLIC PANEL - Abnormal; Notable for the following components:   Sodium 134 (*)    Chloride 97 (*)    Glucose, Bld 413 (*)    Albumin 3.4 (*)    All other components within normal limits  CBC WITH DIFFERENTIAL/PLATELET - Abnormal; Notable for the following components:   WBC 17.7 (*)    Hemoglobin 11.8 (*)    MCV 79.0 (*)    MCH 24.3 (*)    RDW 16.0 (*)    Neutro Abs 15.9 (*)    Abs Immature Granulocytes 0.18 (*)    All other components within normal limits  PROTIME-INR - Abnormal; Notable for the following components:   Prothrombin Time 15.6 (*)    INR 1.3 (*)    All other components within normal limits  URINALYSIS, COMPLETE (UACMP) WITH MICROSCOPIC - Abnormal; Notable for the following components:   Color, Urine YELLOW (*)    APPearance CLEAR (*)    Glucose, UA >=500 (*)    Leukocytes,Ua TRACE (*)    All other components within normal limits  FIBRIN DERIVATIVES D-DIMER ([facility] ONLY) - Abnormal; Notable for the following components:   Fibrin derivatives D-dimer ([facility]) 1,424.00 (*)    All other components within normal limits  TRIGLYCERIDES - Abnormal; Notable for the following components:   Triglycerides 200 (*)    All other components within normal limits  FIBRINOGEN - Abnormal; Notable for the following components:   Fibrinogen 575 (*)    All other components within normal limits  BLOOD GAS, VENOUS - Abnormal; Notable for the following components:   pCO2, Ven 42 (*)    All  other components within normal limits  RESPIRATORY PANEL BY RT PCR (FLU A&B, COVID)  CULTURE, BLOOD (ROUTINE X 2)  CULTURE, BLOOD (ROUTINE X 2)  URINE CULTURE  LIPASE, BLOOD  BRAIN NATRIURETIC PEPTIDE  PROCALCITONIN  LACTATE DEHYDROGENASE  FERRITIN  PREGNANCY, URINE  C-REACTIVE PROTEIN  HEMOGLOBIN A1C  CORTISOL-AM, BLOOD  LACTIC ACID, PLASMA  APTT  HEPARIN LEVEL (UNFRACTIONATED)  POC SARS CORONAVIRUS 2 AG -  ED  POC SARS CORONAVIRUS 2 AG  TROPONIN I (HIGH SENSITIVITY)   ____________________________________________  EKG  ED ECG REPORT I, Loleta Rose, the attending physician, personally viewed and interpreted this ECG.  Date: 07/07/2019 EKG Time: 23: 08 Rate: 129 Rhythm: Sinus tachycardia QRS Axis: normal Intervals: normal including a QTC of 448 ms ST/T Wave abnormalities: normal Narrative Interpretation: no evidence of acute ischemia  ____________________________________________  RADIOLOGY I, Loleta Rose, personally viewed and evaluated these images (plain radiographs) as part of my medical decision making, as well as reviewing the written report by the radiologist.  ED MD interpretation:  Some atypical findings on chest CT which may be sequela from prior  COVID-19 infection.  The patient has extensive bilateral perinephric stranding suggestive of pyelonephritis but without any obstructive uropathy.  No other acute finding identified and the abdomen and pelvis.  Official radiology report(s): CT Chest W Contrast  Result Date: 07/08/2019 CLINICAL DATA:  Sepsis, respiratory illness, poor compliance with follow-up EXAM: CT CHEST, ABDOMEN, AND PELVIS WITH CONTRAST TECHNIQUE: Multidetector CT imaging of the chest, abdomen and pelvis was performed following the standard protocol during bolus administration of intravenous contrast. CONTRAST:  OMNIPAQUE IOHEXOL 350 MG/ML SOLN COMPARISON:  CT 05/22/2018, radiograph 05/23/2019, 07/08/2019 FINDINGS: CT CHEST FINDINGS  Cardiovascular: Normal cardiac size. Trace pericardial fluid. Coronary artery calcifications are present predominantly within the left circumflex. The aortic root is suboptimally assessed given cardiac pulsation artifact. The aorta is normal caliber. Normal 3 vessel branching of the aortic arch with minimal plaque in the right brachiocephalic origin. Proximal great vessels are otherwise unremarkable. Central pulmonary arteries are normal caliber. No large central filling defects on this non tailored examination of the pulmonary arteries. Major venous structures are free of acute abnormality. Mediastinum/Nodes: No mediastinal fluid or gas. Normal thyroid gland and thoracic inlet. No acute abnormality of the trachea or esophagus. Patient was imaged during exhalation with posterior bowing of the trachea. No worrisome mediastinal, hilar or axillary adenopathy. Lungs/Pleura: Mixed ground-glass and consolidative opacity seen towards the lung apices bilaterally, poorly assessed given extensive respiratory motion artifact at the time of exam. Lung bases are relatively clear aside from some hypoventilatory changes likely accentuated by imaging during exhalation as detailed above. No pneumothorax or effusion. No visible nodules or masses within the limitations of this exam imposed by extensive severe respiratory motion artifact. Musculoskeletal: Multilevel degenerative changes are present in the imaged portions of the spine. Slight straightening of normal thoracic kyphosis. No acute or suspicious osseous lesions. CT ABDOMEN PELVIS FINDINGS Hepatobiliary: Portion of the liver protrudes anteriorly into the lax anterior abdominal wall with an irregular contour of segment 4 of the liver. No focal liver lesion is seen. Uniform liver enhancement. Smooth liver surface. Patient is post cholecystectomy. Slight prominence of the extrahepatic biliary tree is likely related to reservoir effect post cholecystectomy. Pancreas:  Unremarkable. No pancreatic ductal dilatation or surrounding inflammatory changes. Spleen: There is a questionable area of geographic hypoattenuation affecting the posterior spleen and periphery concerning for developing splenic infarction which was not readily apparent on comparison exam. Area of concern is excluded from the delayed phase imaging. Adrenals/Urinary Tract: Normal adrenal glands. Extensive bilateral perinephric stranding and hazy retroperitoneal attenuation. No visible or contour deforming renal lesions. Delayed excretion seen bilaterally. No urolithiasis or hydronephrosis. Minimal bladder wall thickening and faint perivesicular haze. No bladder debris, calculus or other acute abnormality. Stomach/Bowel: Distal esophagus, stomach and duodenal sweep are unremarkable. No small bowel wall thickening or dilatation. No evidence of obstruction. A normal appendix is visualized. No colonic dilatation or wall thickening. Vascular/Lymphatic: Atherosclerotic plaque within the normal caliber aorta. Few borderline enlarged left inguinal nodes including a 12 mm left inguinal node (2/105) are similar to a comparison study 04/15/2013. No new enlarged or enlarging nodes are seen. Reproductive: Uterus is surgically absent. No concerning adnexal lesions. Other: Marked laxity of the anterior abdominal wall. Are postsurgical changes of the anterior abdominal wall with fat and simple fluid containing collection to the right of the umbilicus (2/92). Mild edematous changes in the posterior soft tissues. Sacral nerve stimulator is seen in the left gluteal tissues. Contiguous lead enters at the left S3 neural foramen. Musculoskeletal: No acute osseous  abnormality or suspicious osseous lesion. Multilevel degenerative changes are present in the imaged portions of the spine. Features most pronounced at the L4-5 level with a sacralized L5 vertebral body. IMPRESSION: 1. Mixed ground-glass and consolidative opacity seen towards the  lung apices bilaterally, poorly assessed given extensive respiratory motion artifact at the time of exam. Differential diagnosis could reflect residual or recurrent infection change in the setting of prior COVID-19. 2. Extensive bilateral perinephric stranding and hazy retroperitoneal attenuation. The appearance is nonspecific, and can be seen with urinary tract infection/pyelonephritis with minimal thickening of the bladder compatible with cystitis as well. Consider correlation with urinalysis. 3. Questionable area of geographic hypoattenuation affecting the posterior spleen and periphery of the liver, may represent an area of developing splenic infarction. 4. Marked laxity of the anterior abdominal wall with periumbilical postsurgical changes and a fat and simple fluid containing collection to the right of the umbilicus. 5. Borderline enlarged left inguinal lymph nodes are similar to a comparison study 04/15/2013 and likely reactive. 6. Aortic Atherosclerosis (ICD10-I70.0). Electronically Signed   By: Lovena Le M.D.   On: 07/08/2019 03:03   CT ABDOMEN PELVIS W CONTRAST  Result Date: 07/08/2019 CLINICAL DATA:  Sepsis, respiratory illness, poor compliance with follow-up EXAM: CT CHEST, ABDOMEN, AND PELVIS WITH CONTRAST TECHNIQUE: Multidetector CT imaging of the chest, abdomen and pelvis was performed following the standard protocol during bolus administration of intravenous contrast. CONTRAST:  137mL OMNIPAQUE IOHEXOL 350 MG/ML SOLN COMPARISON:  CT 05/22/2018, radiograph 05/23/2019, 07/08/2019 FINDINGS: CT CHEST FINDINGS Cardiovascular: Normal cardiac size. Trace pericardial fluid. Coronary artery calcifications are present predominantly within the left circumflex. The aortic root is suboptimally assessed given cardiac pulsation artifact. The aorta is normal caliber. Normal 3 vessel branching of the aortic arch with minimal plaque in the right brachiocephalic origin. Proximal great vessels are otherwise  unremarkable. Central pulmonary arteries are normal caliber. No large central filling defects on this non tailored examination of the pulmonary arteries. Major venous structures are free of acute abnormality. Mediastinum/Nodes: No mediastinal fluid or gas. Normal thyroid gland and thoracic inlet. No acute abnormality of the trachea or esophagus. Patient was imaged during exhalation with posterior bowing of the trachea. No worrisome mediastinal, hilar or axillary adenopathy. Lungs/Pleura: Mixed ground-glass and consolidative opacity seen towards the lung apices bilaterally, poorly assessed given extensive respiratory motion artifact at the time of exam. Lung bases are relatively clear aside from some hypoventilatory changes likely accentuated by imaging during exhalation as detailed above. No pneumothorax or effusion. No visible nodules or masses within the limitations of this exam imposed by extensive severe respiratory motion artifact. Musculoskeletal: Multilevel degenerative changes are present in the imaged portions of the spine. Slight straightening of normal thoracic kyphosis. No acute or suspicious osseous lesions. CT ABDOMEN PELVIS FINDINGS Hepatobiliary: Portion of the liver protrudes anteriorly into the lax anterior abdominal wall with an irregular contour of segment 4 of the liver. No focal liver lesion is seen. Uniform liver enhancement. Smooth liver surface. Patient is post cholecystectomy. Slight prominence of the extrahepatic biliary tree is likely related to reservoir effect post cholecystectomy. Pancreas: Unremarkable. No pancreatic ductal dilatation or surrounding inflammatory changes. Spleen: There is a questionable area of geographic hypoattenuation affecting the posterior spleen and periphery concerning for developing splenic infarction which was not readily apparent on comparison exam. Area of concern is excluded from the delayed phase imaging. Adrenals/Urinary Tract: Normal adrenal glands.  Extensive bilateral perinephric stranding and hazy retroperitoneal attenuation. No visible or contour deforming renal lesions. Delayed  excretion seen bilaterally. No urolithiasis or hydronephrosis. Minimal bladder wall thickening and faint perivesicular haze. No bladder debris, calculus or other acute abnormality. Stomach/Bowel: Distal esophagus, stomach and duodenal sweep are unremarkable. No small bowel wall thickening or dilatation. No evidence of obstruction. A normal appendix is visualized. No colonic dilatation or wall thickening. Vascular/Lymphatic: Atherosclerotic plaque within the normal caliber aorta. Few borderline enlarged left inguinal nodes including a 12 mm left inguinal node (2/105) are similar to a comparison study 04/15/2013. No new enlarged or enlarging nodes are seen. Reproductive: Uterus is surgically absent. No concerning adnexal lesions. Other: Marked laxity of the anterior abdominal wall. Are postsurgical changes of the anterior abdominal wall with fat and simple fluid containing collection to the right of the umbilicus (2/92). Mild edematous changes in the posterior soft tissues. Sacral nerve stimulator is seen in the left gluteal tissues. Contiguous lead enters at the left S3 neural foramen. Musculoskeletal: No acute osseous abnormality or suspicious osseous lesion. Multilevel degenerative changes are present in the imaged portions of the spine. Features most pronounced at the L4-5 level with a sacralized L5 vertebral body. IMPRESSION: 1. Mixed ground-glass and consolidative opacity seen towards the lung apices bilaterally, poorly assessed given extensive respiratory motion artifact at the time of exam. Differential diagnosis could reflect residual or recurrent infection change in the setting of prior COVID-19. 2. Extensive bilateral perinephric stranding and hazy retroperitoneal attenuation. The appearance is nonspecific, and can be seen with urinary tract infection/pyelonephritis with  minimal thickening of the bladder compatible with cystitis as well. Consider correlation with urinalysis. 3. Questionable area of geographic hypoattenuation affecting the posterior spleen and periphery of the liver, may represent an area of developing splenic infarction. 4. Marked laxity of the anterior abdominal wall with periumbilical postsurgical changes and a fat and simple fluid containing collection to the right of the umbilicus. 5. Borderline enlarged left inguinal lymph nodes are similar to a comparison study 04/15/2013 and likely reactive. 6. Aortic Atherosclerosis (ICD10-I70.0). Electronically Signed   By: Kreg Shropshire M.D.   On: 07/08/2019 03:03   DG Chest Port 1 View  Result Date: 07/08/2019 CLINICAL DATA:  Sepsis EXAM: PORTABLE CHEST 1 VIEW COMPARISON:  05/23/2019 FINDINGS: Cardiomegaly, vascular congestion. Interstitial prominence could reflect early interstitial edema. No effusions. No acute bony abnormality. IMPRESSION: Cardiomegaly with vascular congestion and possible early interstitial edema. Electronically Signed   By: Charlett Nose M.D.   On: 07/08/2019 00:07    ____________________________________________   PROCEDURES   Procedure(s) performed (including Critical Care):  .1-3 Lead EKG Interpretation Performed by: Loleta Rose, MD Authorized by: Loleta Rose, MD     Interpretation: abnormal     ECG rate:  120   ECG rate assessment: tachycardic     Rhythm: sinus tachycardia     Ectopy: none   .Critical Care Performed by: Loleta Rose, MD Authorized by: Loleta Rose, MD   Critical care provider statement:    Critical care time (minutes):  60   Critical care time was exclusive of:  Separately billable procedures and treating other patients   Critical care was necessary to treat or prevent imminent or life-threatening deterioration of the following conditions:  Sepsis   Critical care was time spent personally by me on the following activities:  Development of  treatment plan with patient or surrogate, discussions with consultants, evaluation of patient's response to treatment, examination of patient, obtaining history from patient or surrogate, ordering and performing treatments and interventions, ordering and review of laboratory studies, ordering and review  of radiographic studies, pulse oximetry, re-evaluation of patient's condition and review of old charts     ____________________________________________   INITIAL IMPRESSION / MDM / ASSESSMENT AND PLAN / ED COURSE  As part of my medical decision making, I reviewed the following data within the electronic MEDICAL RECORD NUMBER Nursing notes reviewed and incorporated, Labs reviewed , EKG interpreted , Old chart reviewed, Radiograph reviewed , Discussed with ICU NP Sonda Rumble), Discussed with admitting physician (Dr. Para March) and Notes from prior ED visits   Differential diagnosis includes, but is not limited to, sepsis from any 1 of a number of sources potentially including UTI, community-acquired pneumonia, intra-abdominal infection such as appendicitis or diverticulitis, biliary disease, COVID-19 complication, less likely meningitis.  The patient is awake and alert but obviously uncomfortable and with acute encephalopathy presumably secondary to her acute infection.  She has a fever of 101.1, tachycardia, tachypnea, though surprisingly her oxygenation is staying in the mid upper 90s in spite of her refusal to wear her 4 L of oxygen that she apparently uses at baseline.  I am initiating extensive work-up including sepsis protocol with ideal body weight 30 mL/kg of lactated Ringer's, empiric antibiotics including aztreonam 2 g IV (she reportedly has a cephalosporin allergy and meropenem allergy of hives, so I opted to go with aztreonam), metronidazole 500 mg IV, and vancomycin 1 g IV to be dosed by pharmacy.  We will maintain her on the cardiac monitor to monitor for her heart rate and for arrhythmias.  She  tested positive for COVID-19 more than 90 days ago so I will repeat her Covid test as per hospital protocol.  Chest x-ray is pending and I have asked the nurses to perform in and out catheterization for urine prior to administering empiric fluids and obtaining cultures.      Clinical Course as of Jul 07 620  Wynelle Link Jul 08, 2019  0035 Severely elevated rectal temperature.  Providing acetaminophen 650 mg rectally.  Temp(!): 104.8 F (40.4 C) [CF]  0035 Vascular congestion, questionable early interstitial edema.  DG Chest Port 1 View [CF]  0035 WBC(!): 17.7 [CF]  0103 Generally reassuring venous blood gas with no evidence of severe CO2 retention that could be adding to altered mental status.  Blood gas, venous(!) [CF]  0120 Reassuring Hb  Hemoglobin(!): 11.8 [CF]  0120 Ordering PCR test.  Although she was positive previously, it was more than 90 days ago, so as per system protocol I am supposed to re-test.  SARS Coronavirus 2 Ag: NEGATIVE [CF]  0137 Elevated procalcitonin consistent with systemic infection.  Procalcitonin: 0.45 [CF]  0137 Glucosuria without evidence of infection, certainly not an obvious reason for her septic presentation.  Urinalysis, Complete w Microscopic(!) [CF]  0144 Even after rectal acetaminophen, her repeat rectal temp has not come down, and instead went up slightly to 104.9.  Patient currently lucid and able to take meds, will give ibuprofen 800 mg PO.   [CF]  0146 Unclear clinical significance given her acute infection and relatively recent history of COVID-19.  Her primary issue at this point is not hypoxemia, chest pain, nor shortness of breath, so I am not concerned about an emergent PE at this time.  Fibrin derivatives D-dimer [facility])(!): 1,424.00 [CF]  0201 Consistent with severe sepsis, continuing ideal body weight LR boluses  Lactic Acid, Venous(!!): 4.3 [CF]  0201 CMP is generally reassuring with hyperglycemia but no acute abnormalities and normal renal  function.  Given the patient's altered mental status and her  severe sepsis, I am ordering chest CT with contrast and abdomen/pelvis CT with contrast to try to identify the source of her infection.  Comprehensive metabolic panel(!) [CF]  0212 SARS Coronavirus 2 by RT PCR: NEGATIVE [CF]  0222 Sepsis nurse called ED RN to inquire about quantity of fluids.  I passed along that we are definitely using ideal body weight and confirmed that the appropriate fluids have been ordered.   [CF]  0310 No emergent findings on CT scan.  The source of the infection is slightly unclear.  She has some groundglass opacities in the lungs but this could be left over from her prior Covid infection.  She does have extensive perinephric stranding so pyelonephritis is the highest probability for her infection but there is no sign of urinary obstruction and her urinalysis is generally reassuring.  Urine culture is pending.  I have consulted the hospitalist for admission.   [CF]  [id number] Sepsis reassessment completed.  Also discussed case by phone with hospitalist who will admit.  BP(!): 124/94 [CF]  0339 Worsening lactic acid, ordering additional fluids   Lactic Acid, Venous(!!): 5.1 [CF]  0413 Although the patient's mental status has improved, her lactic acid has worsened and now she is hypotensive.  We have tried repositioning multiple times and checked with a manual cuff and each time she is hypertensive with a MAP no greater than 65 and sometimes as low as 57.I discussed the case by phone with Dr. Para March.  She will continue with the admission but admit the patient to the ICU and I have paged the ICU NP to discuss the case.  The patient will benefit from getting to the unit as soon as possible and they can establish a central line if clinically indicated at that point.   [CF]  [phone] I spoke by phone with Annabelle Harman the ICU NP and apprised her of the clinical situation including the recently developed hypotension but improved mental  status but worsening lactic acid.  She said that she understands and will see the patient once she gets to the unit.  Dr. Lianne Bushy order has gone through and the bed has been assigned.  We are now working on moving her upstairs.  If she cannot get to the unit within a relatively few minutes I will order pressors through her peripheral IV as per protocol but she will be best served by having a central line placed as needed in the unit in a more controlled setting.  In the meantime she is continuing with another liter of lactated Ringer's.   [CF]  0431 Although the patient has a bed assigned, she remains hypotensive and the nurses not yet been able to call report.  I have ordered Levophed titratable order for an MAP of 65 based on the peripheral pressor order set.   [CF]    Clinical Course User Index [CF] Loleta Rose, MD     ____________________________________________  FINAL CLINICAL IMPRESSION(S) / ED DIAGNOSES  Final diagnoses:  Pyelonephritis  Atypical pneumonia  History of COVID-19  Severe sepsis with septic shock (HCC)     MEDICATIONS GIVEN DURING THIS VISIT:  Medications  vancomycin (VANCOCIN) IVPB 1000 mg/200 mL premix (has no administration in time range)  insulin aspart (novoLOG) injection 0-20 Units (has no administration in time range)  insulin aspart (novoLOG) injection 0-5 Units (has no administration in time range)  acetaminophen (TYLENOL) tablet 650 mg (has no administration in time range)    Or  acetaminophen (TYLENOL) suppository 650 mg (has  no administration in time range)  aztreonam (AZACTAM) 2 g in sodium chloride 0.9 % 100 mL IVPB (has no administration in time range)  vancomycin (VANCOREADY) IVPB 1250 mg/250 mL (has no administration in time range)  0.9 %  sodium chloride infusion (has no administration in time range)  norepinephrine (LEVOPHED) 4mg  in premix infusion (has no administration in time range)  vasopressin (PITRESSIN) 40 Units in sodium  chloride 0.9 % 250 mL (0.16 Units/mL) infusion (has no administration in time range)  HYDROmorphone (DILAUDID) injection 0.5 mg (has no administration in time range)  heparin bolus via infusion 4,000 Units (has no administration in time range)    Followed by  heparin ADULT infusion 100 units/mL (25000 units/219mL sodium chloride 0.45%) (has no administration in time range)  lactated ringers bolus 1,000 mL (0 mLs Intravenous Stopped 07/08/19 0402)    And  lactated ringers bolus 1,000 mL (0 mLs Intravenous Stopped 07/08/19 0402)    And  lactated ringers bolus 200 mL (0 mLs Intravenous Stopping Infusion hung by another clincian 07/08/19 0547)  aztreonam (AZACTAM) 2 g in sodium chloride 0.9 % 100 mL IVPB (0 g Intravenous Stopped 07/08/19 0215)  metroNIDAZOLE (FLAGYL) IVPB 500 mg (0 mg Intravenous Stopped 07/08/19 0215)  vancomycin (VANCOCIN) IVPB 1000 mg/200 mL premix (0 mg Intravenous Stopped 07/08/19 0413)  acetaminophen (TYLENOL) suppository 650 mg (650 mg Rectal Given 07/08/19 0030)  ibuprofen (ADVIL) tablet 800 mg (800 mg Oral Given 07/08/19 0150)  iohexol (OMNIPAQUE) 350 MG/ML injection 100 mL (100 mLs Intravenous Contrast Given 07/08/19 0220)  ondansetron (ZOFRAN) injection 4 mg (4 mg Intravenous Given 07/08/19 0327)  lactated ringers bolus 250 mL (0 mLs Intravenous Stopping Infusion hung by another clincian 07/08/19 0547)  lactated ringers bolus 1,000 mL (1,000 mLs Intravenous New Bag/Given 07/08/19 0411)  morphine 2 MG/ML injection 2 mg (2 mg Intravenous Given 07/08/19 0548)  HYDROmorphone (DILAUDID) injection 0.5 mg (0.5 mg Intravenous Given 07/08/19 0549)     ED Discharge Orders    None      *Please note:  [name] [name] was evaluated in Emergency Department on 07/08/2019 for the symptoms described in the history of present illness. She was evaluated in the context of the global COVID-19 pandemic, which necessitated consideration that the patient might be at risk for infection with the  SARS-CoV-2 virus that causes COVID-19. Institutional protocols and algorithms that pertain to the evaluation of patients at risk for COVID-19 are in a state of rapid change based on information released by regulatory bodies including the CDC and federal and state organizations. These policies and algorithms were followed during the patient's care in the ED.  Some ED evaluations and interventions may be delayed as a result of limited staffing during the pandemic.*  Note:  This document was prepared using Dragon voice recognition software and may include unintentional dictation errors.   07/10/2019, MD 07/08/19 [phone]

## 2019-07-08 NOTE — Care Plan (Signed)
   Patient is clinically deteriorating, post 4L IVF and on maximum levophed quad strength as well as max vasopresssin and addition of epinephrine gtt with IV solucortef and albumin patient remains with MAP 55-60 and hypoxemia on 6L supplemental O2.    She reports severe pain and requests more analgesia but im concerned regarding respiratory depression, profound hypotension and worsening mentation.    We discussed GOC and patient wishes to be DNR and specifically no intubation.   At this time however patient is not completely lucid with metabolic and septic encephalopathy therefore capacity is hindered and I will ask palliative care service to attempt locating any family for further guidance with advance directive.      [name] [name], M.D.  Pulmonary & Critical Care Medicine  [facility]

## 2019-07-08 NOTE — Progress Notes (Signed)
ANTICOAGULATION CONSULT NOTE - Initial Consult  Pharmacy Consult for Heparin Indication: VTE prophylaxis  Allergies  Allergen Reactions  . Cephalexin Hives  . Codeine Palpitations, Nausea Only, Nausea And Vomiting, Rash and Shortness Of Breath    "makes heart fly, she gets flushed and passes out"  . Doxycycline Rash  . Propoxyphene Rash and Shortness Of Breath    Increase heart rate  . Sulfa Antibiotics Palpitations, Nausea Only, Shortness Of Breath and Hives    "makes heart fly, she gets flushed and passes out"  . Lovenox [Enoxaparin Sodium] Hives  . Hydrocodone Nausea And Vomiting    Hear racing & breaks out into a cold sweat.  . Meropenem Rash    Erythematous, hot, pruritic rash over arms, chest, back, abdomen, and face occurred at the end of meropenem infusion on 02/22/18    Patient Measurements: Height: 5\' 10"  (177.8 cm) Weight: (!) 350 lb (158.8 kg) IBW/kg (Calculated) : 68.5 HEPARIN DW (KG): 107.6  Vital Signs: Temp: 100.2 F (37.9 C) (03/21 0403) Temp Source: Oral (03/21 0403) BP: 90/66 (03/21 0440) Pulse Rate: 108 (03/21 0440)  Labs: Recent Labs    07/08/19 0014  HGB 11.8*  HCT 38.4  PLT 314  LABPROT 15.6*  INR 1.3*  CREATININE 0.90    Estimated Creatinine Clearance: 113.9 mL/min (by C-G formula based on SCr of 0.9 mg/dL).   Medical History: Past Medical History:  Diagnosis Date  . Abdominal wall hernia 01/29/2013  . Anxiety   . Arthritis    Rheumatoid  . C. difficile colitis   . Chronic diastolic heart failure ([place])   . COVID-19 03/23/2019   Diagnosed at [facility] (send-out) on 03/23/2019  . Depression   . Diabetes mellitus    states no meds or diet restrictions  at present  . Diastolic CHF ([place])   . Esophagitis   . Fluid retention   . GERD (gastroesophageal reflux disease)   . Hiatal hernia   . Hypertension   . Hypokalemia due to loss of potassium 10/21/2015   Overview:  Associated with 3 weeks of diarrhea  And QT prolongation.  .  Hypothyroidism   . IBS (irritable bowel syndrome)   . Moderate episode of recurrent major depressive disorder ([place]) 06/03/2004  . Morbid obesity ([place])   . MRSA (methicillin resistant Staphylococcus aureus) infection 11/2017   left inner thigh abcess  . Neurogenic bladder    has pacemaker  . Neuropathy   . Obesity   . Panic attacks   . Pneumonia due to COVID-19 virus   . Rheumatoid arthritis ([place])   . Sleep apnea    STATES SEVERE, CANT TOLERATE MASK- LAST STUDY YEARS AGO    Medications:  Medications Prior to Admission  Medication Sig Dispense Refill Last Dose  . ALPRAZolam (XANAX) 0.5 MG tablet Take 1 tablet (0.5 mg total) by mouth 2 (two) times daily as needed for anxiety. 10 tablet 0   . ARIPiprazole (ABILIFY) 5 MG tablet TAKE 1 TABLET BY MOUTH ONCE DAILY. (Patient taking differently: Take 5 mg by mouth daily. ) 28 tablet 11   . ascorbic acid (VITAMIN C) 500 MG tablet Take 1 tablet (500 mg total) by mouth daily. 30 tablet 0   . atorvastatin (LIPITOR) 80 MG tablet Take 80 mg by mouth daily at 6 PM.      . benzonatate (TESSALON PERLES) 100 MG capsule Take 1 capsule (100 mg total) by mouth 3 (three) times daily as needed for cough. 30 capsule 0   .  budesonide (PULMICORT) 0.5 MG/2ML nebulizer solution Take 2 mLs (0.5 mg total) by nebulization 2 (two) times daily. 120 mL 11   . busPIRone (BUSPAR) 10 MG tablet TAKE 1 TABLET BY MOUTH TWICE DAILY (Patient taking differently: Take 10 mg by mouth 2 (two) times daily. ) 56 tablet 11   . Calcium Carb-Ergocalciferol 250-125 MG-UNIT TABS Take 1 tablet by mouth daily.      . Cholecalciferol (VITAMIN D3) 125 MCG (5000 UT) CAPS Take 1 capsule (5,000 Units total) by mouth daily with breakfast. Take along with calcium and magnesium. 90 capsule 3   . ciclesonide (ALVESCO) 160 MCG/ACT inhaler Inhale 4 puffs into the lungs 2 (two) times daily. 1 Inhaler 0   . dexamethasone (DECADRON) 1 MG tablet Take 2 mg daily for 3days,Take 1mg  daily for 3 days, Take 1mg   every other day for 3days,then stop 12 tablet 0   . Ensure Max Protein (ENSURE MAX PROTEIN) LIQD Take 330 mLs (11 oz total) by mouth 2 (two) times daily between meals. 10000 mL 0   . famotidine (PEPCID) 20 MG tablet Take 1 tablet (20 mg total) by mouth daily. 30 tablet 0   . FLUoxetine (PROZAC) 20 MG capsule Take 1 capsule (20 mg total) by mouth daily. TAKE (1) CAPSULE BY MOUTH ONCE DAILY. 30 capsule 6   . folic acid (FOLVITE) 1 MG tablet Take 1 mg by mouth daily.      . furosemide (LASIX) 20 MG tablet Take 1 tablet (20 mg total) by mouth daily as needed for fluid or edema (weight gain >3 lbs in 1 day or >5 lbs in 2 days). 30 tablet 0   . Insulin Degludec (TRESIBA FLEXTOUCH) 200 UNIT/ML SOPN Inject 65-100 Units into the skin daily.     ipratropium-albuterol (DUONEB) 0.5-2.5 (3) MG/3ML SOLN Take 3 mLs by nebulization 3 (three) times daily. 360 mL 0   . levothyroxine (SYNTHROID, LEVOTHROID) 88 MCG tablet Take 88 mcg by mouth daily before breakfast.     . linagliptin (TRADJENTA) 5 MG TABS tablet Take 1 tablet (5 mg total) by mouth daily. 30 tablet 0   . lisinopril (PRINIVIL,ZESTRIL) 2.5 MG tablet Take 2.5 mg by mouth daily.     oxyCODONE (OXY IR/ROXICODONE) 5 MG immediate release tablet Take 1 tablet (5 mg total) by mouth every 6 (six) hours as needed for severe pain. Must last 30 days 120 tablet 0   . polyethylene glycol (MIRALAX / GLYCOLAX) 17 g packet Take 17 g by mouth daily as needed for mild constipation. 14 each 0   . pramipexole (MIRAPEX) 0.125 MG tablet Take 0.125 mg by mouth 2 (two) times daily.     . pregabalin (LYRICA) 150 MG capsule Take 1 capsule (150 mg total) by mouth 3 (three) times daily. 90 capsule 5   . QUEtiapine (SEROQUEL) 300 MG tablet TAKE ONE TABLET BY MOUTH AT BEDTIME. (Patient taking differently: Take 300 mg by mouth at bedtime. TAKE ONE TABLET BY MOUTH AT BEDTIME.) 28 tablet 11   . VESICARE 5 MG tablet Take 5 mg by mouth daily.     Marland Kitchen zinc sulfate 220 (50 Zn) MG capsule  Take 1 capsule (220 mg total) by mouth daily. 30 capsule 0   . zolpidem (AMBIEN) 5 MG tablet Take 1 tablet (5 mg total) by mouth at bedtime. TAKE 1 TABLET BY MOUTH EVERYDAY AT BEDTIME 30 tablet 5     Assessment: Baseline labs ordered.  No anticoagulants PTA.  Heparin for VTE prophylaxis.  Goal of Therapy:  Heparin level 0.3-0.7 units/ml Monitor platelets by anticoagulation protocol: Yes   Plan:  Heparin 4000 units bolus x 1 then infusion at 1600 units/hr Check HL ~ 6 hours after heparin started  Valrie Hart A 07/08/2019,5:50 AM

## 2019-07-08 NOTE — Progress Notes (Signed)
eLink Physician-Brief Progress Note Patient Name: [name] [name] DOB: 10/25/1960 MRN: [id number]   Date of Service  07/08/2019  HPI/Events of Note  PT with recent COVID  pneumonia and apparent post-COVID syndrome, now presents with septic shock and a concern for a pro-thrombotic syndrome. Unfortunately Pt received contrast in the ED for CT scans but did not have a CTA lung to r/o PE.  eICU Interventions  Pt will be empirically anticoagulated pending r/o of PE, add Vasopressin to Norepinephrine, Empiric Vancomycin + Azactam. New Patient Evaluation completed.        Thomasene Lot Chalene Treu 07/08/2019, 5:49 AM

## 2019-07-08 NOTE — Progress Notes (Signed)
CODE SEPSIS - PHARMACY COMMUNICATION  **Broad Spectrum Antibiotics should be administered within 1 hour of Sepsis diagnosis**  Time Code Sepsis Called/Page Received: 2348  Antibiotics Ordered: Azactam, Flagyl, Vancomycin  Time of 1st antibiotic administration: 0035, Azactam  Additional action taken by pharmacy: n/a  If necessary, Name of Provider/Nurse Contacted: n/a    Wayland Denis ,PharmD Clinical Pharmacist  07/08/2019  12:44 AM

## 2019-07-08 NOTE — Progress Notes (Addendum)
PHARMACY - PHYSICIAN COMMUNICATION CRITICAL VALUE ALERT - BLOOD CULTURE IDENTIFICATION (BCID)  [name] [name] is an 59 y.o. female who presented to [facility] on 07/07/2019   Assessment: **UPDATE: second call from lab with all 4 bottles MRSA**  Name of physician (or Provider) Contacted: Dr. [name] Christmas  Current antibiotics: vanc/aztreonam/metronidazole  Changes to prescribed antibiotics recommended: Cefepime changed to cipro for possible UTI. Will continue vanc for positive blood cultures. Possible discontinuation of cipro as cultures result.   Pricilla Riffle, PharmD 07/08/2019  10:10 AM

## 2019-07-08 NOTE — Procedures (Signed)
Central Venous Catheter Insertion Procedure Note JEMILA CAMILLE [id number] 1960-08-27  Procedure: Insertion of Central Venous Catheter Indications: Assessment of intravascular volume, Drug and/or fluid administration and Frequent blood sampling  Procedure Details Consent: Unable to obtain consent because of altered level of consciousness. Time Out: Verified patient identification, verified procedure, site/side was marked, verified correct patient position, special equipment/implants available, medications/allergies/relevent history reviewed, required imaging and test results available.  Performed  Maximum sterile technique was used including antiseptics, cap, gloves, gown, hand hygiene, mask and sheet. Skin prep: Chlorhexidine; local anesthetic administered A antimicrobial bonded/coated triple lumen catheter was placed in the right internal jugular vein using the Seldinger technique.  Evaluation Blood flow good Complications: No apparent complications Patient did tolerate procedure well. Chest X-ray ordered to verify placement.  CXR: pending.  Right internal jugular central line placed emergently utilizing ultrasound due to severe hypotension and vasopressor requirements no complications noted during or following procedure.   Sonda Rumble, AGNP  Pulmonary/Critical Care Pager [phone] (please enter 7 digits) PCCM Consult Pager [phone] (please enter 7 digits) a

## 2019-07-08 NOTE — Progress Notes (Signed)
PHARMACY -  BRIEF ANTIBIOTIC NOTE   Pharmacy has received consult(s) for Aztreonam and Vancomycin from an ED provider.  The patient's profile has been reviewed for ht/wt/allergies/indication/available labs.    One time order(s) placed for Azactam 2gm and Vancomycin 2gm (1gm + 1gm)  Further antibiotics/pharmacy consults should be ordered by admitting physician if indicated.                       Thank you, Valrie Hart A 07/08/2019  12:45 AM

## 2019-07-08 NOTE — Progress Notes (Signed)
PHARMACY CONSULT NOTE  Pharmacy Consult for Electrolyte Monitoring and Replacement   Recent Labs: Potassium (mmol/L)  Date Value  07/08/2019 3.7  05/27/2014 3.8   Magnesium (mg/dL)  Date Value  87/21/5872 1.8  12/02/2013 1.1 (L)   Calcium (mg/dL)  Date Value  76/18/4859 9.1   Calcium, Total (mg/dL)  Date Value  27/63/9432 8.5   Albumin (g/dL)  Date Value  00/37/9444 3.4 (L)  11/16/2018 4.2  12/03/2013 2.7 (L)   Phosphorus (mg/dL)  Date Value  61/90/1222 4.3   Sodium (mmol/L)  Date Value  07/08/2019 134 (L)  11/16/2018 134  05/27/2014 136     Assessment: 59 year old female admitted with difficulty breathing. Patient recently hospitalized 04/06/19 to 05/04/19 post-COVID. Pharmacy consulted for management of electrolytes and glucose control.  Goal of Therapy:  Electrolytes WNL Glucose < 180 (ideally)  Plan:  Electrolytes: stable. No replacement indicated today. Labs currently ordered daily. Will continue to follow along.  Glucose: elevated at this time. It looks like patient takes [place] and linagliptin PTA. Currently has SSI ordered. Receiving stress dose steroids. Will start conservative dose of Lantus 10 units daily at this time. Follow up BG trend in the morning and adjust accordingly.  Pricilla Riffle ,PharmD Clinical Pharmacist 07/08/2019 11:30 AM

## 2019-07-08 NOTE — Consult Note (Signed)
   New positive MRSA blood cultures.  Patient on high pressor support.  TTE without any findings.  On vancomycin.  Overall deterioration.  ID to follow tomorrow.      [place] [facility] Management Team Staphylococcus aureus bacteremia   Staphylococcus aureus bacteremia (SAB) is associated with a high rate of complications and mortality.  Specific aspects of clinical management are critical to optimizing the outcome of patients with SAB.  Therefore, the [facility] Antimicrobial Management Team [facility]) has initiated an intervention aimed at improving the management of SAB at [facility].  To do so, Infectious Diseases physicians are providing an evidence-based consult for the management of all patients with SAB.     Yes No Comments  Perform follow-up blood cultures (even if the patient is afebrile) to ensure clearance of bacteremia [x]  []    Remove vascular catheter and obtain follow-up blood cultures after the removal of the catheter [x]  []    Perform echocardiography to evaluate for endocarditis (transthoracic ECHO is 40-50% sensitive, TEE is > 90% sensitive) [x]  []  Please keep in mind, that neither test can definitively EXCLUDE endocarditis, and that should clinical suspicion remain high for endocarditis the patient should then still be treated with an "endocarditis" duration of therapy = 6 weeks  Consult electrophysiologist to evaluate implanted cardiac device (pacemaker, ICD) []  [x]    Ensure source control []  [x]  Have all abscesses been drained effectively? Have deep seeded infections (septic joints or osteomyelitis) had appropriate surgical debridement?  Investigate for "metastatic" sites of infection [x]  []  Does the patient have ANY symptom or physical exam finding that would suggest a deeper infection (back or neck pain that may be suggestive of vertebral osteomyelitis or epidural abscess, muscle pain that could be a symptom of pyomyositis)?  Keep in mind that for deep seeded infections  MRI imaging with contrast is preferred rather than other often insensitive tests such as plain x-rays, especially early in a patient's presentation.  Change antibiotic therapy to ____on vancomycin______________ []  [x]  Beta-lactam antibiotics are preferred for MSSA due to higher cure rates.   If on Vancomycin, goal trough should be 15 - 20 mcg/mL  Estimated duration of IV antibiotic therapy:   []  []  Consult case management for probably prolonged outpatient IV antibiotic therapy

## 2019-07-08 NOTE — Consult Note (Addendum)
Name: [name] [name] MRN: [id number] DOB: October 19, 1960    ADMISSION DATE:  07/07/2019 CONSULTATION DATE: 07/08/2019  REFERRING MD : Dr. York Cerise   CHIEF COMPLAINT: Generalized Body Aches   BRIEF PATIENT DESCRIPTION:   21F hx of DM2, HTN, RA, MDD, HFpEF, OSA, BMI >50, s/p COVID19 with severe ARDS, came in febrile hypoxemic with elevated lactate and CT chest abd showing poss pyelo and spenic infarct.   SIGNIFICANT EVENTS/STUDIES:  03/21: Pt admitted to ICU with hypotension secondary to septic shock  03/21: CT Chest/Abd/Pelvis revealed mixed ground-glass and consolidative opacity seen towards the lung apices bilaterally, poorly assessed given extensive respiratory motion artifact at the time of exam. Differential diagnosis could reflect residual or recurrent infection change in the setting of prior COVID-19. Extensive bilateral perinephric stranding and hazy retroperitoneal attenuation. The appearance is nonspecific, and can be seen with urinary tract infection/ pyelonephritis with minimal thickening of the bladder compatible with cystitis as well. Consider correlation with urinalysis. Questionable area of geographic hypoattenuation affecting the posterior spleen and periphery of the liver, may represent an area of developing splenic infarction. Marked laxity of the anterior abdominal wall with periumbilical postsurgical changes and a fat and simple fluid containing collection to the right of the umbilicus. Borderline enlarged left inguinal lymph nodes are similar to a comparison study 04/15/2013 and likely reactive. Aortic Atherosclerosis    PAST MEDICAL HISTORY :   has a past medical history of Abdominal wall hernia (01/29/2013), Anxiety, Arthritis, C. difficile colitis, Chronic diastolic heart failure (HCC), [id number] (03/23/2019), Depression, Diabetes mellitus, Diastolic CHF (HCC), Esophagitis, Fluid retention, GERD (gastroesophageal reflux disease), Hiatal hernia, Hypertension, Hypokalemia due  to loss of potassium (10/21/2015), Hypothyroidism, IBS (irritable bowel syndrome), Moderate episode of recurrent major depressive disorder (HCC) (06/03/2004), Morbid obesity (HCC), MRSA (methicillin resistant Staphylococcus aureus) infection (11/2017), Neurogenic bladder, Neuropathy, Obesity, Panic attacks, Pneumonia due to COVID-19 virus, Rheumatoid arthritis (HCC), and Sleep apnea.  has a past surgical history that includes Tubal ligation; Tonsillectomy; Cholecystectomy; Abdominal hysterectomy; Laparoscopic gastric banding (03/20/07); Eye surgery; Hernia repair; and DG GREAT TOE RIGHT FOOT (02/23/2018). Prior to Admission medications   Medication Sig Start Date End Date Taking? Authorizing Provider  ALPRAZolam Prudy Feeler) 0.5 MG tablet Take 1 tablet (0.5 mg total) by mouth 2 (two) times daily as needed for anxiety. 05/04/19   Rolly Salter, MD  ARIPiprazole (ABILIFY) 5 MG tablet TAKE 1 TABLET BY MOUTH ONCE DAILY. Patient taking differently: Take 5 mg by mouth daily.  02/22/19   Clapacs, Jackquline Denmark, MD  ascorbic acid (VITAMIN C) 500 MG tablet Take 1 tablet (500 mg total) by mouth daily. 05/05/19   Rolly Salter, MD  atorvastatin (LIPITOR) 80 MG tablet Take 80 mg by mouth daily at 6 PM.  02/09/13   [provider]  benzonatate (TESSALON PERLES) 100 MG capsule Take 1 capsule (100 mg total) by mouth 3 (three) times daily as needed for cough. 05/23/19 05/22/20  Willy Eddy, MD  budesonide (PULMICORT) 0.5 MG/2ML nebulizer solution Take 2 mLs (0.5 mg total) by nebulization 2 (two) times daily. 06/04/19 06/03/20  Salena Saner, MD  busPIRone (BUSPAR) 10 MG tablet TAKE 1 TABLET BY MOUTH TWICE DAILY Patient taking differently: Take 10 mg by mouth 2 (two) times daily.  02/05/19   Clapacs, Jackquline Denmark, MD  Calcium Carb-Ergocalciferol 250-125 MG-UNIT TABS Take 1 tablet by mouth daily.     [provider]  Cholecalciferol (VITAMIN D3) 125 MCG (5000 UT) CAPS Take 1 capsule (5,000 Units total) by  mouth daily  with breakfast. Take along with calcium and magnesium. 02/14/19 02/14/20  Delano Metz, MD  ciclesonide (ALVESCO) 160 MCG/ACT inhaler Inhale 4 puffs into the lungs 2 (two) times daily. 05/04/19   Rolly Salter, MD  dexamethasone (DECADRON) 1 MG tablet Take 2 mg daily for 3days,Take  daily for 3 days, Take  every other day for 3days,then stop 05/04/19   Rolly Salter, MD  Ensure Max Protein (ENSURE MAX PROTEIN) LIQD Take 330 mLs (11 oz total) by mouth 2 (two) times daily between meals. 05/04/19   Rolly Salter, MD  famotidine (PEPCID) 20 MG tablet Take 1 tablet (20 mg total) by mouth daily. 05/05/19   Rolly Salter, MD  FLUoxetine (PROZAC) 20 MG capsule Take 1 capsule (20 mg total) by mouth daily. TAKE (1) CAPSULE BY MOUTH ONCE DAILY. 05/24/19   Clapacs, Jackquline Denmark, MD  folic acid (FOLVITE) 1 MG tablet Take 1 mg by mouth daily.     [provider]  furosemide (LASIX) 20 MG tablet Take 1 tablet (20 mg total) by mouth daily as needed for fluid or edema (weight gain >3 lbs in 1 day or >5 lbs in 2 days). 05/04/19 05/03/20  Rolly Salter, MD  Insulin Degludec (TRESIBA FLEXTOUCH) 200 UNIT/ML SOPN Inject 65-100 Units into the skin daily.    [provider]  ipratropium-albuterol (DUONEB) 0.5-2.5 (3) MG/3ML SOLN Take 3 mLs by nebulization 3 (three) times daily. 05/04/19   Rolly Salter, MD  levothyroxine (SYNTHROID, LEVOTHROID) 88 MCG tablet Take 88 mcg by mouth daily before breakfast.    [provider]  linagliptin (TRADJENTA) 5 MG TABS tablet Take 1 tablet (5 mg total) by mouth daily. 05/05/19   Rolly Salter, MD  lisinopril (PRINIVIL,ZESTRIL) 2.5 MG tablet Take 2.5 mg by mouth daily.    [provider]  oxyCODONE (OXY IR/ROXICODONE) 5 MG immediate release tablet Take 1 tablet (5 mg total) by mouth every 6 (six) hours as needed for severe pain. Must last 30 days 04/15/19 05/15/19  Delano Metz, MD  polyethylene glycol (MIRALAX / GLYCOLAX) 17 g packet Take  17 g by mouth daily as needed for mild constipation. 05/04/19   Rolly Salter, MD  pramipexole (MIRAPEX) 0.125 MG tablet Take 0.125 mg by mouth 2 (two) times daily. 03/08/19   [provider]  pregabalin (LYRICA) 150 MG capsule Take 1 capsule (150 mg total) by mouth 3 (three) times daily. 02/14/19 08/13/19  Delano Metz, MD  QUEtiapine (SEROQUEL) 300 MG tablet TAKE ONE TABLET BY MOUTH AT BEDTIME. Patient taking differently: Take 300 mg by mouth at bedtime. TAKE ONE TABLET BY MOUTH AT BEDTIME. 02/05/19   Clapacs, Jackquline Denmark, MD  VESICARE 5 MG tablet Take 5 mg by mouth daily. 03/16/19   [provider]  zinc sulfate 220 (50 Zn) MG capsule Take 1 capsule (220 mg total) by mouth daily. 05/05/19   Rolly Salter, MD  zolpidem (AMBIEN) 5 MG tablet Take 1 tablet (5 mg total) by mouth at bedtime. TAKE 1 TABLET BY MOUTH EVERYDAY AT BEDTIME 05/24/19   Clapacs, Jackquline Denmark, MD   Allergies  Allergen Reactions   Cephalexin Hives   Codeine Palpitations, Nausea Only, Nausea And Vomiting, Rash and Shortness Of Breath    "makes heart fly, she gets flushed and passes out"   Doxycycline Rash   Propoxyphene Rash and Shortness Of Breath    Increase heart rate   Sulfa Antibiotics Palpitations, Nausea Only, Shortness Of  Breath and Hives    "makes heart fly, she gets flushed and passes out"   Lovenox [Enoxaparin Sodium] Hives   Hydrocodone Nausea And Vomiting    Hear racing & breaks out into a cold sweat.   Meropenem Rash    Erythematous, hot, pruritic rash over arms, chest, back, abdomen, and face occurred at the end of meropenem infusion on 02/22/18    FAMILY HISTORY:  family history includes Alcohol abuse in her father and sister; Anxiety disorder in her father, sister, and sister; Bipolar disorder in her father and sister; Depression in her father, sister, and sister; Drug abuse in her sister; Heart attack in her brother; Heart attack (age of onset: 52) in her brother; Heart disease in her  brother; Heart failure in her father. SOCIAL HISTORY:  reports that she quit smoking about 19 years ago. Her smoking use included cigarettes. She has a 54.00 pack-year smoking history. She has never used smokeless tobacco. She reports that she does not drink alcohol or use drugs.  REVIEW OF SYSTEMS:   Constitutional: Negative for fever, chills, weight loss, malaise/fatigue and diaphoresis.  HENT: Negative for hearing loss, ear pain, nosebleeds, congestion, sore throat, neck pain, tinnitus and ear discharge.   Eyes: Negative for blurred vision, double vision, photophobia, pain, discharge and redness.  Respiratory: Negative for cough, hemoptysis, sputum production, shortness of breath, wheezing and stridor.   Cardiovascular: Negative for chest pain, palpitations, orthopnea, claudication, leg swelling and PND.  Gastrointestinal: Negative for heartburn, nausea, vomiting, abdominal pain, diarrhea, constipation, blood in stool and melena.  Genitourinary: Negative for dysuria, urgency, frequency, hematuria and flank pain.  Musculoskeletal: Negative for myalgias, back pain, joint pain and falls.  Skin: Negative for itching and rash.  Neurological: Negative for dizziness, tingling, tremors, sensory change, speech change, focal weakness, seizures, loss of consciousness, weakness and headaches.  Endo/Heme/Allergies: Negative for environmental allergies and polydipsia. Does not bruise/bleed easily.  SUBJECTIVE:   VITAL SIGNS: Temp:  [100.2 F (37.9 C)-104.9 F (40.5 C)] 100.2 F (37.9 C) (03/21 0403) Pulse Rate:  [108-130] 108 (03/21 0440) Resp:  [18-31] 31 (03/21 0440) BP: (68-144)/(46-94) 90/66 (03/21 0440) SpO2:  [94 %-99 %] 95 % (03/21 0440) Weight:  [158.8 kg] 158.8 kg (03/20 2310)  PHYSICAL EXAMINATION: General:  Age appropriate, mild distress Neuro:  GCS 12  HEENT:  Thick neck no rigidity no thyromegaly Cardiovascular:  ST no murmurs Lungs:  Decreased bs bilaterally  Abdomen:  Obese ,  mild tenderness to palpation Musculoskeletal:  Obese with 1-2+pitting edema Skin:  Intact   Recent Labs  Lab 07/08/19 0014  NA 134*  K 3.7  CL 97*  CO2 26  BUN 17  CREATININE 0.90  GLUCOSE 413*   Recent Labs  Lab 07/08/19 0014  HGB 11.8*  HCT 38.4  WBC 17.7*  PLT 314   CT Chest W Contrast  Result Date: 07/08/2019 CLINICAL DATA:  Sepsis, respiratory illness, poor compliance with follow-up EXAM: CT CHEST, ABDOMEN, AND PELVIS WITH CONTRAST TECHNIQUE: Multidetector CT imaging of the chest, abdomen and pelvis was performed following the standard protocol during bolus administration of intravenous contrast. CONTRAST:  OMNIPAQUE IOHEXOL 350 MG/ML SOLN COMPARISON:  CT 05/22/2018, radiograph 05/23/2019, 07/08/2019 FINDINGS: CT CHEST FINDINGS Cardiovascular: Normal cardiac size. Trace pericardial fluid. Coronary artery calcifications are present predominantly within the left circumflex. The aortic root is suboptimally assessed given cardiac pulsation artifact. The aorta is normal caliber. Normal 3 vessel branching of the aortic arch with minimal plaque in the right brachiocephalic  origin. Proximal great vessels are otherwise unremarkable. Central pulmonary arteries are normal caliber. No large central filling defects on this non tailored examination of the pulmonary arteries. Major venous structures are free of acute abnormality. Mediastinum/Nodes: No mediastinal fluid or gas. Normal thyroid gland and thoracic inlet. No acute abnormality of the trachea or esophagus. Patient was imaged during exhalation with posterior bowing of the trachea. No worrisome mediastinal, hilar or axillary adenopathy. Lungs/Pleura: Mixed ground-glass and consolidative opacity seen towards the lung apices bilaterally, poorly assessed given extensive respiratory motion artifact at the time of exam. Lung bases are relatively clear aside from some hypoventilatory changes likely accentuated by imaging during exhalation as  detailed above. No pneumothorax or effusion. No visible nodules or masses within the limitations of this exam imposed by extensive severe respiratory motion artifact. Musculoskeletal: Multilevel degenerative changes are present in the imaged portions of the spine. Slight straightening of normal thoracic kyphosis. No acute or suspicious osseous lesions. CT ABDOMEN PELVIS FINDINGS Hepatobiliary: Portion of the liver protrudes anteriorly into the lax anterior abdominal wall with an irregular contour of segment 4 of the liver. No focal liver lesion is seen. Uniform liver enhancement. Smooth liver surface. Patient is post cholecystectomy. Slight prominence of the extrahepatic biliary tree is likely related to reservoir effect post cholecystectomy. Pancreas: Unremarkable. No pancreatic ductal dilatation or surrounding inflammatory changes. Spleen: There is a questionable area of geographic hypoattenuation affecting the posterior spleen and periphery concerning for developing splenic infarction which was not readily apparent on comparison exam. Area of concern is excluded from the delayed phase imaging. Adrenals/Urinary Tract: Normal adrenal glands. Extensive bilateral perinephric stranding and hazy retroperitoneal attenuation. No visible or contour deforming renal lesions. Delayed excretion seen bilaterally. No urolithiasis or hydronephrosis. Minimal bladder wall thickening and faint perivesicular haze. No bladder debris, calculus or other acute abnormality. Stomach/Bowel: Distal esophagus, stomach and duodenal sweep are unremarkable. No small bowel wall thickening or dilatation. No evidence of obstruction. A normal appendix is visualized. No colonic dilatation or wall thickening. Vascular/Lymphatic: Atherosclerotic plaque within the normal caliber aorta. Few borderline enlarged left inguinal nodes including a 12 mm left inguinal node (2/105) are similar to a comparison study 04/15/2013. No new enlarged or enlarging nodes  are seen. Reproductive: Uterus is surgically absent. No concerning adnexal lesions. Other: Marked laxity of the anterior abdominal wall. Are postsurgical changes of the anterior abdominal wall with fat and simple fluid containing collection to the right of the umbilicus (2/92). Mild edematous changes in the posterior soft tissues. Sacral nerve stimulator is seen in the left gluteal tissues. Contiguous lead enters at the left S3 neural foramen. Musculoskeletal: No acute osseous abnormality or suspicious osseous lesion. Multilevel degenerative changes are present in the imaged portions of the spine. Features most pronounced at the L4-5 level with a sacralized L5 vertebral body. IMPRESSION: 1. Mixed ground-glass and consolidative opacity seen towards the lung apices bilaterally, poorly assessed given extensive respiratory motion artifact at the time of exam. Differential diagnosis could reflect residual or recurrent infection change in the setting of prior COVID-19. 2. Extensive bilateral perinephric stranding and hazy retroperitoneal attenuation. The appearance is nonspecific, and can be seen with urinary tract infection/pyelonephritis with minimal thickening of the bladder compatible with cystitis as well. Consider correlation with urinalysis. 3. Questionable area of geographic hypoattenuation affecting the posterior spleen and periphery of the liver, may represent an area of developing splenic infarction. 4. Marked laxity of the anterior abdominal wall with periumbilical postsurgical changes and a fat and simple fluid  containing collection to the right of the umbilicus. 5. Borderline enlarged left inguinal lymph nodes are similar to a comparison study 04/15/2013 and likely reactive. 6. Aortic Atherosclerosis (ICD10-I70.0). Electronically Signed   By: Kreg Shropshire M.D.   On: 07/08/2019 03:03   CT ABDOMEN PELVIS W CONTRAST  Result Date: 07/08/2019 CLINICAL DATA:  Sepsis, respiratory illness, poor compliance with  follow-up EXAM: CT CHEST, ABDOMEN, AND PELVIS WITH CONTRAST TECHNIQUE: Multidetector CT imaging of the chest, abdomen and pelvis was performed following the standard protocol during bolus administration of intravenous contrast. CONTRAST:  OMNIPAQUE IOHEXOL 350 MG/ML SOLN COMPARISON:  CT 05/22/2018, radiograph 05/23/2019, 07/08/2019 FINDINGS: CT CHEST FINDINGS Cardiovascular: Normal cardiac size. Trace pericardial fluid. Coronary artery calcifications are present predominantly within the left circumflex. The aortic root is suboptimally assessed given cardiac pulsation artifact. The aorta is normal caliber. Normal 3 vessel branching of the aortic arch with minimal plaque in the right brachiocephalic origin. Proximal great vessels are otherwise unremarkable. Central pulmonary arteries are normal caliber. No large central filling defects on this non tailored examination of the pulmonary arteries. Major venous structures are free of acute abnormality. Mediastinum/Nodes: No mediastinal fluid or gas. Normal thyroid gland and thoracic inlet. No acute abnormality of the trachea or esophagus. Patient was imaged during exhalation with posterior bowing of the trachea. No worrisome mediastinal, hilar or axillary adenopathy. Lungs/Pleura: Mixed ground-glass and consolidative opacity seen towards the lung apices bilaterally, poorly assessed given extensive respiratory motion artifact at the time of exam. Lung bases are relatively clear aside from some hypoventilatory changes likely accentuated by imaging during exhalation as detailed above. No pneumothorax or effusion. No visible nodules or masses within the limitations of this exam imposed by extensive severe respiratory motion artifact. Musculoskeletal: Multilevel degenerative changes are present in the imaged portions of the spine. Slight straightening of normal thoracic kyphosis. No acute or suspicious osseous lesions. CT ABDOMEN PELVIS FINDINGS Hepatobiliary: Portion of  the liver protrudes anteriorly into the lax anterior abdominal wall with an irregular contour of segment 4 of the liver. No focal liver lesion is seen. Uniform liver enhancement. Smooth liver surface. Patient is post cholecystectomy. Slight prominence of the extrahepatic biliary tree is likely related to reservoir effect post cholecystectomy. Pancreas: Unremarkable. No pancreatic ductal dilatation or surrounding inflammatory changes. Spleen: There is a questionable area of geographic hypoattenuation affecting the posterior spleen and periphery concerning for developing splenic infarction which was not readily apparent on comparison exam. Area of concern is excluded from the delayed phase imaging. Adrenals/Urinary Tract: Normal adrenal glands. Extensive bilateral perinephric stranding and hazy retroperitoneal attenuation. No visible or contour deforming renal lesions. Delayed excretion seen bilaterally. No urolithiasis or hydronephrosis. Minimal bladder wall thickening and faint perivesicular haze. No bladder debris, calculus or other acute abnormality. Stomach/Bowel: Distal esophagus, stomach and duodenal sweep are unremarkable. No small bowel wall thickening or dilatation. No evidence of obstruction. A normal appendix is visualized. No colonic dilatation or wall thickening. Vascular/Lymphatic: Atherosclerotic plaque within the normal caliber aorta. Few borderline enlarged left inguinal nodes including a 12 mm left inguinal node (2/105) are similar to a comparison study 04/15/2013. No new enlarged or enlarging nodes are seen. Reproductive: Uterus is surgically absent. No concerning adnexal lesions. Other: Marked laxity of the anterior abdominal wall. Are postsurgical changes of the anterior abdominal wall with fat and simple fluid containing collection to the right of the umbilicus (2/92). Mild edematous changes in the posterior soft tissues. Sacral nerve stimulator is seen in the left gluteal tissues.  Contiguous  lead enters at the left S3 neural foramen. Musculoskeletal: No acute osseous abnormality or suspicious osseous lesion. Multilevel degenerative changes are present in the imaged portions of the spine. Features most pronounced at the L4-5 level with a sacralized L5 vertebral body. IMPRESSION: 1. Mixed ground-glass and consolidative opacity seen towards the lung apices bilaterally, poorly assessed given extensive respiratory motion artifact at the time of exam. Differential diagnosis could reflect residual or recurrent infection change in the setting of prior COVID-19. 2. Extensive bilateral perinephric stranding and hazy retroperitoneal attenuation. The appearance is nonspecific, and can be seen with urinary tract infection/pyelonephritis with minimal thickening of the bladder compatible with cystitis as well. Consider correlation with urinalysis. 3. Questionable area of geographic hypoattenuation affecting the posterior spleen and periphery of the liver, may represent an area of developing splenic infarction. 4. Marked laxity of the anterior abdominal wall with periumbilical postsurgical changes and a fat and simple fluid containing collection to the right of the umbilicus. 5. Borderline enlarged left inguinal lymph nodes are similar to a comparison study 04/15/2013 and likely reactive. 6. Aortic Atherosclerosis (ICD10-I70.0). Electronically Signed   By: Lovena Le M.D.   On: 07/08/2019 03:03   DG Chest Port 1 View  Result Date: 07/08/2019 CLINICAL DATA:  Sepsis EXAM: PORTABLE CHEST 1 VIEW COMPARISON:  05/23/2019 FINDINGS: Cardiomegaly, vascular congestion. Interstitial prominence could reflect early interstitial edema. No effusions. No acute bony abnormality. IMPRESSION: Cardiomegaly with vascular congestion and possible early interstitial edema. Electronically Signed   By: Rolm Baptise M.D.   On: 07/08/2019 00:07         ASSESSMENT / PLAN:  Acute on chronic hypoxemic respiratory failure  - Serial  chest imaging reviewed - as evidenced above lung parenchyma radiographically improved.  At this point with new febrile illness, will empirically tx for possible post COVID bacterial pneumonia as well as potential urinary/renal source of infectious etiology due to perinephric stranding and inguinal lymphadenopathy.   -present on admission with lactate elevation and alteration in mental status -bibasilar atelectasis-chest physiotherapy - use bed chestPT for now and when patient is able to participate we can try metaneb      Septic shock -present on admission - source is MRSA bacteremia -IV vancomycin - pharmacy consult -MAP<60 on max quad Levo and Vaso, will add epinephrine gtt -albumin 25% iv daily  -SoluCortef IV 50 q6h -Procal trend -CBCwdiff trend -DIC workup with pathology slide review for schistocytes -CVP trend  -diurese when able -currently profoundly hypotensive on 3 vasopressors   Altered mentals status due to septic and metabolic encephalopathy - treat underlying cause -correct electrolytes - pharmD consult -treat infection -optimize insulin for glucose control goal 160-180 while critically ill  -PT/OT and reorientation when possible    Chronic COPD without acute exacerbation  - continue alvesco and duoNEB - caution with tachycardia       Chronic diastolic CHF (congestive heart failure) -Not acutely exacerbated -Monitor for fluid overload in view of sepsis fluids -BNP and TropI are reassuring       Rheumatoid arthritis (HCC)   Chronic pain syndrome -Resume meds pending med rec -Judicious narcotic use in view of altered mental status    Hypothyroidism -Continue home levothyroxine    Morbid obesity with BMI of 50.0-59.9, adult  -This complicates overall prognosis and care   Critical care provider statement:    Critical care time (minutes):  33   Critical care time was exclusive of:  Separately billable procedures and  treating other patients  Critical care was necessary to treat or prevent imminent or  life-threatening deterioration of the following conditions:  acute hypoxemic respiratory failure, septic shock, pyelonephritis, s/p COVID19, multiple comorbid condidtions   Critical care was time spent personally by me on the following  activities:  Development of treatment plan with patient or surrogate,  discussions with consultants, evaluation of patient's response to  treatment, examination of patient, obtaining history from patient or  surrogate, ordering and performing treatments and interventions, ordering  and review of laboratory studies and re-evaluation of patient's condition   I assumed direction of critical care for this patient from another  provider in my specialty: no      Vida Rigger, M.D.  Pulmonary & Critical Care Medicine  [facility]

## 2019-07-08 NOTE — ED Notes (Signed)
Date and time results received: 07/08/19 1:58 AM   Test: Lactic Critical Value: 4.3  Name of Provider Notified: York Cerise MD

## 2019-07-08 NOTE — Progress Notes (Signed)
Rebekah bedside RN quickly responded earlier concerning LA and BC being drawn with confirmation, she also just made me aware of LA 4.3. She follow up quickly with MD concerning which weight to use for sepsis, MD told her ideal body weight, RN is taking pt for CT and will draw 2nd LA

## 2019-07-08 NOTE — Progress Notes (Signed)
Pharmacy Antibiotic Note  [name] [name] is a 59 y.o. female admitted on 07/07/2019 with sepsis.  Pharmacy has been consulted for Vancomycin and Aztreonam dosing.  Pt received loading dose in ED, Vanc 2gm   Plan: Aztreonam 2gm IV q8hrs  Vancomycin 1250 mg IV Q 12 hrs. Goal AUC 400-550. Expected AUC: 474.9, Css 14.3 SCr used: 0.9   Height: 5\' 10"  (177.8 cm) Weight: (!) 350 lb (158.8 kg) IBW/kg (Calculated) : 68.5  Temp (24hrs), Avg:102.8 F (39.3 C), Min:100.2 F (37.9 C), Max:104.9 F (40.5 C)  Recent Labs  Lab 07/08/19 0014 07/08/19 0257  WBC 17.7*  --   CREATININE 0.90  --   LATICACIDVEN 4.3* 5.1*    Estimated Creatinine Clearance: 113.9 mL/min (by C-G formula based on SCr of 0.9 mg/dL).    Allergies  Allergen Reactions  . Cephalexin Hives  . Codeine Palpitations, Nausea Only, Nausea And Vomiting, Rash and Shortness Of Breath    "makes heart fly, she gets flushed and passes out"  . Doxycycline Rash  . Propoxyphene Rash and Shortness Of Breath    Increase heart rate  . Sulfa Antibiotics Palpitations, Nausea Only, Shortness Of Breath and Hives    "makes heart fly, she gets flushed and passes out"  . Lovenox [Enoxaparin Sodium] Hives  . Hydrocodone Nausea And Vomiting    Hear racing & breaks out into a cold sweat.  . Meropenem Rash    Erythematous, hot, pruritic rash over arms, chest, back, abdomen, and face occurred at the end of meropenem infusion on 02/22/18    Antimicrobials this admission:   >>    >>   Dose adjustments this admission:   Microbiology results:  BCx:   UCx:    Sputum:    MRSA PCR:   Thank you for allowing pharmacy to be a part of this patient's care.  13/6/19 A 07/08/2019 4:18 AM

## 2019-07-08 NOTE — Progress Notes (Signed)
*  PRELIMINARY RESULTS* Echocardiogram 2D Echocardiogram has been performed.  [name] [name] [name] [name] 07/08/2019, 9:28 AM

## 2019-07-08 NOTE — H&P (Signed)
History and Physical    [name] [name]:[id number] DOB: Jan 29, 1961 DOA: 07/07/2019  PCP: [facility]   Patient coming from: home  I have personally briefly reviewed patient's old medical records in [facility]  Chief Complaint: Generalized body aches and shortness of breath  HPI: [name] [name] is a 59 y.o. female with extensive past medical history significant for HTN, DM 2 with complications, hypothyroidism, depression anxiety, rheumatoid arthritis, OSA, diastolic heart failure, abdominal wall hernia, morbid obesity, who was hospitalized in December 2020 for ARDS secondary to COVID-19 pneumonia, who presents to the emergency room with a 1 day history of generalized body aches and shortness of breath, arriving by EMS on 4 L O2 via nasal cannula.  Patient is awake, somewhat somnolent, complaining of aches all over.Marland Kitchen  History is limited due to somnolence and acuity of her condition  ED Course: On arrival her temperature was 101.1, BP 144/86, pulse 123, O2 sat 98% on room air.  Venous pH 7.4 venous PCO2 42.  Blood work revealed WBC 17.7, Hb 11.8, blood sugar 413.  Lactic acid 4.8, Pro-Calc 0.45, elevated inflammatory markers but negative for Covid and flu.  Urinalysis unremarkable.  She had CT chest abdomen pelvis that showed perinephric stranding, possible developing splenic infarct, and groundglass and consolidative opacity possibly residual or current Covid.  Patient started on sepsis fluids for sepsis of unknown source.  Hospitalist consulted for admission.  Blood pressures started dropping while in the emergency room and diagnosis of septic shock made following decision to admit  Review of Systems: Admitted due to acuity of condition  Past Medical History:  Diagnosis Date  . Abdominal wall hernia 01/29/2013  . Anxiety   . Arthritis    Rheumatoid  . C. difficile colitis   . Chronic diastolic heart failure (HCC)   . COVID-19 03/23/2019   Diagnosed at [facility] (send-out) on  03/23/2019  . Depression   . Diabetes mellitus    states no meds or diet restrictions  at present  . Diastolic CHF (HCC)   . Esophagitis   . Fluid retention   . GERD (gastroesophageal reflux disease)   . Hiatal hernia   . Hypertension   . Hypokalemia due to loss of potassium 10/21/2015   Overview:  Associated with 3 weeks of diarrhea  And QT prolongation.  . Hypothyroidism   . IBS (irritable bowel syndrome)   . Moderate episode of recurrent major depressive disorder (HCC) 06/03/2004  . Morbid obesity (HCC)   . MRSA (methicillin resistant Staphylococcus aureus) infection 11/2017   left inner thigh abcess  . Neurogenic bladder    has pacemaker  . Neuropathy   . Obesity   . Panic attacks   . Pneumonia due to COVID-19 virus   . Rheumatoid arthritis (HCC)   . Sleep apnea    STATES SEVERE, CANT TOLERATE MASK- LAST STUDY YEARS AGO    Past Surgical History:  Procedure Laterality Date  . ABDOMINAL HYSTERECTOMY    . CHOLECYSTECTOMY    . DG GREAT TOE RIGHT FOOT  02/23/2018  . EYE SURGERY     bilateral cataract extraction with IOL  . HERNIA REPAIR     ventral hernia with strangulation  . LAPAROSCOPIC GASTRIC BANDING  03/20/07  . TONSILLECTOMY    . TUBAL LIGATION       reports that she quit smoking about 19 years ago. Her smoking use included cigarettes. She has a 54.00 pack-year smoking history. She has never used smokeless tobacco. She  reports that she does not drink alcohol or use drugs.  Allergies  Allergen Reactions  . Cephalexin Hives  . Codeine Palpitations, Nausea Only, Nausea And Vomiting, Rash and Shortness Of Breath    "makes heart fly, she gets flushed and passes out"  . Doxycycline Rash  . Propoxyphene Rash and Shortness Of Breath    Increase heart rate  . Sulfa Antibiotics Palpitations, Nausea Only, Shortness Of Breath and Hives    "makes heart fly, she gets flushed and passes out"  . Lovenox [Enoxaparin Sodium] Hives  . Hydrocodone Nausea And Vomiting    Hear  racing & breaks out into a cold sweat.  . Meropenem Rash    Erythematous, hot, pruritic rash over arms, chest, back, abdomen, and face occurred at the end of meropenem infusion on 02/22/18    Family History  Problem Relation Age of Onset  . Heart failure Father   . Bipolar disorder Father   . Alcohol abuse Father   . Anxiety disorder Father   . Depression Father   . Heart disease Brother   . Heart attack Brother 54       MI s/p stents placed  . Anxiety disorder Sister   . Depression Sister   . Anxiety disorder Sister   . Depression Sister   . Bipolar disorder Sister   . Alcohol abuse Sister   . Drug abuse Sister   . Heart attack Brother      Prior to Admission medications   Medication Sig Start Date End Date Taking? Authorizing Provider  ALPRAZolam Prudy Feeler) 0.5 MG tablet Take 1 tablet (0.5 mg total) by mouth 2 (two) times daily as needed for anxiety. 05/04/19   [name] Salter, [name]  ARIPiprazole (ABILIFY) 5 MG tablet TAKE 1 TABLET BY MOUTH ONCE DAILY. Patient taking differently: Take 5 mg by mouth daily.  02/22/19   [name] [name]  ascorbic acid (VITAMIN C) 500 MG tablet Take 1 tablet (500 mg total) by mouth daily. 05/05/19   [name] Salter, [name]  atorvastatin (LIPITOR) 80 MG tablet Take 80 mg by mouth daily at 6 PM.  02/09/13   [name] [name]  benzonatate (TESSALON PERLES) 100 MG capsule Take 1 capsule (100 mg total) by mouth 3 (three) times daily as needed for cough. 05/23/19 05/22/20  [name] [name]  budesonide (PULMICORT) 0.5 MG/2ML nebulizer solution Take 2 mLs (0.5 mg total) by nebulization 2 (two) times daily. 06/04/19 06/03/20  [name] [name]  busPIRone (BUSPAR) 10 MG tablet TAKE 1 TABLET BY MOUTH TWICE DAILY Patient taking differently: Take 10 mg by mouth 2 (two) times daily.  02/05/19   [name] [name]  Calcium Carb-Ergocalciferol 250-125 MG-UNIT TABS Take 1 tablet by mouth daily.     [name] [name]  Cholecalciferol (VITAMIN D3) 125 MCG  (5000 UT) CAPS Take 1 capsule (5,000 Units total) by mouth daily with breakfast. Take along with calcium and magnesium. 02/14/19 02/14/20  [name] [name]  ciclesonide (ALVESCO) 160 MCG/ACT inhaler Inhale 4 puffs into the lungs 2 (two) times daily. 05/04/19   [name] Salter, [name]  dexamethasone (DECADRON) 1 MG tablet Take 2 mg daily for 3days,Take  daily for 3 days, Take  every other day for 3days,then stop 05/04/19   [name] Salter, [name]  Ensure Max Protein (ENSURE MAX PROTEIN) LIQD Take 330 mLs (11 oz total) by mouth 2 (two) times daily between meals. 05/04/19   [name] Salter, [name]  famotidine (PEPCID) 20 MG  tablet Take 1 tablet (20 mg total) by mouth daily. 05/05/19   [name] Salter, [name]  FLUoxetine (PROZAC) 20 MG capsule Take 1 capsule (20 mg total) by mouth daily. TAKE (1) CAPSULE BY MOUTH ONCE DAILY. 05/24/19   [name] [name]  folic acid (FOLVITE) 1 MG tablet Take 1 mg by mouth daily.     [name] [name]  furosemide (LASIX) 20 MG tablet Take 1 tablet (20 mg total) by mouth daily as needed for fluid or edema (weight gain >3 lbs in 1 day or >5 lbs in 2 days). 05/04/19 05/03/20  [name] Salter, [name]  Insulin Degludec (TRESIBA FLEXTOUCH) 200 UNIT/ML SOPN Inject 65-100 Units into the skin daily.    [name] [name]  ipratropium-albuterol (DUONEB) 0.5-2.5 (3) MG/3ML SOLN Take 3 mLs by nebulization 3 (three) times daily. 05/04/19   [name] Salter, [name]  levothyroxine (SYNTHROID, LEVOTHROID) 88 MCG tablet Take 88 mcg by mouth daily before breakfast.    [name] [name]  linagliptin (TRADJENTA) 5 MG TABS tablet Take 1 tablet (5 mg total) by mouth daily. 05/05/19   [name] Salter, [name]  lisinopril (PRINIVIL,ZESTRIL) 2.5 MG tablet Take 2.5 mg by mouth daily.    [name] [name]  oxyCODONE (OXY IR/ROXICODONE) 5 MG immediate release tablet Take 1 tablet (5 mg total) by mouth every 6 (six) hours as needed for severe pain. Must last 30 days 04/15/19 05/15/19  [name] [name]  polyethylene glycol (MIRALAX / GLYCOLAX) 17 g packet Take 17 g by mouth daily as needed for mild constipation. 05/04/19   [name] Salter, [name]  pramipexole (MIRAPEX) 0.125 MG tablet Take 0.125 mg by mouth 2 (two) times daily. 03/08/19   [name] [name]  pregabalin (LYRICA) 150 MG capsule Take 1 capsule (150 mg total) by mouth 3 (three) times daily. 02/14/19 08/13/19  [name] [name]  QUEtiapine (SEROQUEL) 300 MG tablet TAKE ONE TABLET BY MOUTH AT BEDTIME. Patient taking differently: Take 300 mg by mouth at bedtime. TAKE ONE TABLET BY MOUTH AT BEDTIME. 02/05/19   [name] [name]  VESICARE 5 MG tablet Take 5 mg by mouth daily. 03/16/19   [name] [name]  zinc sulfate 220 (50 Zn) MG capsule Take 1 capsule (220 mg total) by mouth daily. 05/05/19   [name] Salter, [name]  zolpidem (AMBIEN) 5 MG tablet Take 1 tablet (5 mg total) by mouth at bedtime. TAKE 1 TABLET BY MOUTH EVERYDAY AT BEDTIME 05/24/19   Audery Amel, [name]    Physical Exam: Vitals:   07/08/19 0140 07/08/19 0143 07/08/19 0216 07/08/19 0300  BP: (!) 128/91  103/70 (!) 124/94  Pulse: (!) 124  (!) 121 (!) 127  Resp: Temp:  (!) 104.9 F (40.5 C)    TempSrc:  Rectal    SpO2: 97%  97%   Weight:      Height:         Vitals:   07/08/19 0300 07/08/19 0403 07/08/19 0406 07/08/19 0409  BP: (!) 124/94  (!) 72/49 (!) 68/48  Pulse: (!) 127  (!) 113   Resp: 20  (!) 22   Temp:  100.2 F (37.9 C)    TempSrc:  Oral    SpO2:   94%   Weight:      Height:        Constitutional: Awake but somnolent, groaning with pain  eyes: PERLA, EOMI, irises appear normal, anicteric sclera,  ENMT: external ears and  nose appear normal, normal hearing             Lips appears normal, oropharynx mucosa, tongue, posterior pharynx appear normal  Neck: neck appears normal, no masses, normal ROM, no thyromegaly, no JVD  CVS: Tachycardic.  S1-S2 clear, no murmur rubs or gallops,  , no carotid bruits, , No LE  edema Respiratory:  clear to auscultation bilaterally, no wheezing, rales or rhonchi. Respiratory effort normal. No accessory muscle use.  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 Liver Function Tests: Recent Labs  Lab 07/08/19 0014  AST 17  ALT 12  ALKPHOS 87  BILITOT 0.9  PROT 7.7  ALBUMIN 3.4*   Recent Labs  Lab 07/08/19 0014  LIPASE 36   No results for input(s): AMMONIA in the last 168 hours. Coagulation Profile: Recent Labs  Lab 07/08/19 0014  INR 1.3*   Cardiac Enzymes: No results for input(s): CKTOTAL, CKMB, CKMBINDEX, TROPONINI in the last 168 hours. BNP (last 3 results) No results for input(s): PROBNP in the last 8760 hours. HbA1C: No results for input(s): HGBA1C in the last 72 hours. CBG: No results for input(s): GLUCAP in the last 168 hours. Lipid Profile: Recent Labs    07/07/19 0014  TRIG 200*   Thyroid Function Tests: No results for input(s): TSH, T4TOTAL, FREET4, T3FREE, THYROIDAB in the last 72 hours. Anemia Panel: Recent Labs    07/08/19 0014  FERRITIN 42   Urine analysis:    Component Value Date/Time   COLORURINE YELLOW (A) 07/08/2019 0014   APPEARANCEUR CLEAR (A) 07/08/2019 0014   APPEARANCEUR  Clear 11/30/2013 1010   LABSPEC 1.014 07/08/2019 0014   LABSPEC 1.015 11/30/2013 1010   PHURINE 5.0 07/08/2019 0014   GLUCOSEU >=500 (A) 07/08/2019 0014   GLUCOSEU >=500 11/30/2013 1010   HGBUR NEGATIVE 07/08/2019 0014   BILIRUBINUR NEGATIVE 07/08/2019 0014   BILIRUBINUR Negative 11/30/2013 1010   KETONESUR NEGATIVE 07/08/2019 0014   PROTEINUR NEGATIVE 07/08/2019 0014   UROBILINOGEN 0.2 03/14/2007 1000   NITRITE NEGATIVE 07/08/2019 0014   LEUKOCYTESUR TRACE (A) 07/08/2019 0014   LEUKOCYTESUR Negative 11/30/2013 1010    Radiological Exams on Admission: CT Chest W Contrast  Result Date: 07/08/2019 CLINICAL DATA:  Sepsis, respiratory illness, poor compliance with follow-up EXAM: CT CHEST, ABDOMEN, AND PELVIS WITH CONTRAST TECHNIQUE: Multidetector CT imaging of the chest, abdomen and pelvis was performed following the standard protocol during bolus administration of intravenous contrast. CONTRAST:  OMNIPAQUE IOHEXOL 350 MG/ML SOLN COMPARISON:  CT 05/22/2018, radiograph 05/23/2019, 07/08/2019 FINDINGS: CT CHEST FINDINGS Cardiovascular: Normal cardiac size. Trace pericardial fluid. Coronary artery calcifications are present predominantly within the left circumflex. The aortic root is suboptimally assessed given cardiac pulsation artifact. The aorta  is normal caliber. Normal 3 vessel branching of the aortic arch with minimal plaque in the right brachiocephalic origin. Proximal great vessels are otherwise unremarkable. Central pulmonary arteries are normal caliber. No large central filling defects on this non tailored examination of the pulmonary arteries. Major venous structures are free of acute abnormality. Mediastinum/Nodes: No mediastinal fluid or gas. Normal thyroid gland and thoracic inlet. No acute abnormality of the trachea or esophagus. Patient was imaged during exhalation with posterior bowing of the trachea. No worrisome mediastinal, hilar or axillary adenopathy. Lungs/Pleura: Mixed  ground-glass and consolidative opacity seen towards the lung apices bilaterally, poorly assessed given extensive respiratory motion artifact at the time of exam. Lung bases are relatively clear aside from some hypoventilatory changes likely accentuated by imaging during exhalation as detailed above. No pneumothorax or effusion. No visible nodules or masses within the limitations of this exam imposed by extensive severe respiratory motion artifact. Musculoskeletal: Multilevel degenerative changes are present in the imaged portions of the spine. Slight straightening of normal thoracic kyphosis. No acute or suspicious osseous lesions. CT ABDOMEN PELVIS FINDINGS Hepatobiliary: Portion of the liver protrudes anteriorly into the lax anterior abdominal wall with an irregular contour of segment 4 of the liver. No focal liver lesion is seen. Uniform liver enhancement. Smooth liver surface. Patient is post cholecystectomy. Slight prominence of the extrahepatic biliary tree is likely related to reservoir effect post cholecystectomy. Pancreas: Unremarkable. No pancreatic ductal dilatation or surrounding inflammatory changes. Spleen: There is a questionable area of geographic hypoattenuation affecting the posterior spleen and periphery concerning for developing splenic infarction which was not readily apparent on comparison exam. Area of concern is excluded from the delayed phase imaging. Adrenals/Urinary Tract: Normal adrenal glands. Extensive bilateral perinephric stranding and hazy retroperitoneal attenuation. No visible or contour deforming renal lesions. Delayed excretion seen bilaterally. No urolithiasis or hydronephrosis. Minimal bladder wall thickening and faint perivesicular haze. No bladder debris, calculus or other acute abnormality. Stomach/Bowel: Distal esophagus, stomach and duodenal sweep are unremarkable. No small bowel wall thickening or dilatation. No evidence of obstruction. A normal appendix is visualized. No  colonic dilatation or wall thickening. Vascular/Lymphatic: Atherosclerotic plaque within the normal caliber aorta. Few borderline enlarged left inguinal nodes including a 12 mm left inguinal node (2/105) are similar to a comparison study 04/15/2013. No new enlarged or enlarging nodes are seen. Reproductive: Uterus is surgically absent. No concerning adnexal lesions. Other: Marked laxity of the anterior abdominal wall. Are postsurgical changes of the anterior abdominal wall with fat and simple fluid containing collection to the right of the umbilicus (2/92). Mild edematous changes in the posterior soft tissues. Sacral nerve stimulator is seen in the left gluteal tissues. Contiguous lead enters at the left S3 neural foramen. Musculoskeletal: No acute osseous abnormality or suspicious osseous lesion. Multilevel degenerative changes are present in the imaged portions of the spine. Features most pronounced at the L4-5 level with a sacralized L5 vertebral body. IMPRESSION: 1. Mixed ground-glass and consolidative opacity seen towards the lung apices bilaterally, poorly assessed given extensive respiratory motion artifact at the time of exam. Differential diagnosis could reflect residual or recurrent infection change in the setting of prior COVID-19. 2. Extensive bilateral perinephric stranding and hazy retroperitoneal attenuation. The appearance is nonspecific, and can be seen with urinary tract infection/pyelonephritis with minimal thickening of the bladder compatible with cystitis as well. Consider correlation with urinalysis. 3. Questionable area of geographic hypoattenuation affecting the posterior spleen and periphery of the liver, may represent an area of developing splenic infarction.  4. Marked laxity of the anterior abdominal wall with periumbilical postsurgical changes and a fat and simple fluid containing collection to the right of the umbilicus. 5. Borderline enlarged left inguinal lymph nodes are similar to a  comparison study 04/15/2013 and likely reactive. 6. Aortic Atherosclerosis (ICD10-I70.0). Electronically Signed   By: Lovena Le M.D.   On: 07/08/2019 03:03   CT ABDOMEN PELVIS W CONTRAST  Result Date: 07/08/2019 CLINICAL DATA:  Sepsis, respiratory illness, poor compliance with follow-up EXAM: CT CHEST, ABDOMEN, AND PELVIS WITH CONTRAST TECHNIQUE: Multidetector CT imaging of the chest, abdomen and pelvis was performed following the standard protocol during bolus administration of intravenous contrast. CONTRAST:  156mL OMNIPAQUE IOHEXOL 350 MG/ML SOLN COMPARISON:  CT 05/22/2018, radiograph 05/23/2019, 07/08/2019 FINDINGS: CT CHEST FINDINGS Cardiovascular: Normal cardiac size. Trace pericardial fluid. Coronary artery calcifications are present predominantly within the left circumflex. The aortic root is suboptimally assessed given cardiac pulsation artifact. The aorta is normal caliber. Normal 3 vessel branching of the aortic arch with minimal plaque in the right brachiocephalic origin. Proximal great vessels are otherwise unremarkable. Central pulmonary arteries are normal caliber. No large central filling defects on this non tailored examination of the pulmonary arteries. Major venous structures are free of acute abnormality. Mediastinum/Nodes: No mediastinal fluid or gas. Normal thyroid gland and thoracic inlet. No acute abnormality of the trachea or esophagus. Patient was imaged during exhalation with posterior bowing of the trachea. No worrisome mediastinal, hilar or axillary adenopathy. Lungs/Pleura: Mixed ground-glass and consolidative opacity seen towards the lung apices bilaterally, poorly assessed given extensive respiratory motion artifact at the time of exam. Lung bases are relatively clear aside from some hypoventilatory changes likely accentuated by imaging during exhalation as detailed above. No pneumothorax or effusion. No visible nodules or masses within the limitations of this exam imposed by  extensive severe respiratory motion artifact. Musculoskeletal: Multilevel degenerative changes are present in the imaged portions of the spine. Slight straightening of normal thoracic kyphosis. No acute or suspicious osseous lesions. CT ABDOMEN PELVIS FINDINGS Hepatobiliary: Portion of the liver protrudes anteriorly into the lax anterior abdominal wall with an irregular contour of segment 4 of the liver. No focal liver lesion is seen. Uniform liver enhancement. Smooth liver surface. Patient is post cholecystectomy. Slight prominence of the extrahepatic biliary tree is likely related to reservoir effect post cholecystectomy. Pancreas: Unremarkable. No pancreatic ductal dilatation or surrounding inflammatory changes. Spleen: There is a questionable area of geographic hypoattenuation affecting the posterior spleen and periphery concerning for developing splenic infarction which was not readily apparent on comparison exam. Area of concern is excluded from the delayed phase imaging. Adrenals/Urinary Tract: Normal adrenal glands. Extensive bilateral perinephric stranding and hazy retroperitoneal attenuation. No visible or contour deforming renal lesions. Delayed excretion seen bilaterally. No urolithiasis or hydronephrosis. Minimal bladder wall thickening and faint perivesicular haze. No bladder debris, calculus or other acute abnormality. Stomach/Bowel: Distal esophagus, stomach and duodenal sweep are unremarkable. No small bowel wall thickening or dilatation. No evidence of obstruction. A normal appendix is visualized. No colonic dilatation or wall thickening. Vascular/Lymphatic: Atherosclerotic plaque within the normal caliber aorta. Few borderline enlarged left inguinal nodes including a 12 mm left inguinal node (2/105) are similar to a comparison study 04/15/2013. No new enlarged or enlarging nodes are seen. Reproductive: Uterus is surgically absent. No concerning adnexal lesions. Other: Marked laxity of the anterior  abdominal wall. Are postsurgical changes of the anterior abdominal wall with fat and simple fluid containing collection to the right of the umbilicus (  2/92). Mild edematous changes in the posterior soft tissues. Sacral nerve stimulator is seen in the left gluteal tissues. Contiguous lead enters at the left S3 neural foramen. Musculoskeletal: No acute osseous abnormality or suspicious osseous lesion. Multilevel degenerative changes are present in the imaged portions of the spine. Features most pronounced at the L4-5 level with a sacralized L5 vertebral body. IMPRESSION: 1. Mixed ground-glass and consolidative opacity seen towards the lung apices bilaterally, poorly assessed given extensive respiratory motion artifact at the time of exam. Differential diagnosis could reflect residual or recurrent infection change in the setting of prior COVID-19. 2. Extensive bilateral perinephric stranding and hazy retroperitoneal attenuation. The appearance is nonspecific, and can be seen with urinary tract infection/pyelonephritis with minimal thickening of the bladder compatible with cystitis as well. Consider correlation with urinalysis. 3. Questionable area of geographic hypoattenuation affecting the posterior spleen and periphery of the liver, may represent an area of developing splenic infarction. 4. Marked laxity of the anterior abdominal wall with periumbilical postsurgical changes and a fat and simple fluid containing collection to the right of the umbilicus. 5. Borderline enlarged left inguinal lymph nodes are similar to a comparison study 04/15/2013 and likely reactive. 6. Aortic Atherosclerosis (ICD10-I70.0). Electronically Signed   By: Kreg Shropshire M.D.   On: 07/08/2019 03:03   DG Chest Port 1 View  Result Date: 07/08/2019 CLINICAL DATA:  Sepsis EXAM: PORTABLE CHEST 1 VIEW COMPARISON:  05/23/2019 FINDINGS: Cardiomegaly, vascular congestion. Interstitial prominence could reflect early interstitial edema. No  effusions. No acute bony abnormality. IMPRESSION: Cardiomegaly with vascular congestion and possible early interstitial edema. Electronically Signed   By: Charlett Nose M.D.   On: 07/08/2019 00:07    EKG: Independently reviewed.   Assessment/Plan    Septic shock (HCC)  -Sepsis criteria, fever, tachycardia, lactic acid 4.8>>5.1, source of infection, altered mental status, hypotension post sepsis fluids -Unknown source.  CT abdomen and pelvis shows perinephric stranding but urinalysis not consistent with UTI -Patient remained hypotensive post bolus with MAP 65 -Start Levophed to keep map over 65 -Aztreonam, metronidazole and vancomycin for sepsis of unknown source.  Patient allergic to cephalosporins --Admit to ICU  Acute metabolic encephalopathy -Altered mental status likely related to sepsis -Fall and aspiration precautions -Neurologic checks    Hyperglycemia due to type 2 diabetes mellitus (HCC) -Blood sugar 413 -Sliding scale insulin coverage -Follow A1c -   COPD (chronic obstructive pulmonary disease) (HCC) -Not acutely exacerbated -DuoNebs as needed    Essential (primary) hypertension -Blood pressure was initially stable now hypotensive -Hold home antihypertensives  -   Chronic diastolic CHF (congestive heart failure) (HCC) -Not acutely exacerbated -Monitor for fluid overload in view of sepsis fluids    Rheumatoid arthritis (HCC)   Chronic pain syndrome -Resume meds pending med rec -Judicious narcotic use in view of altered mental status    Hypothyroidism -Continue home levothyroxine    Morbid obesity with BMI of 50.0-59.9, adult (HCC) -This complicates overall prognosis and care    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

## 2019-07-08 NOTE — ED Notes (Signed)
Date and time results received: 03/21/213:38 AM  Test: Lactic Critical Value: 5.1  Name of Provider Notified: York Cerise MD

## 2019-07-08 NOTE — ED Notes (Signed)
Transported to CT 

## 2019-07-09 ENCOUNTER — Inpatient Hospital Stay: Payer: Medicaid Other

## 2019-07-09 ENCOUNTER — Encounter: Payer: Self-pay | Admitting: Internal Medicine

## 2019-07-09 DIAGNOSIS — Z6841 Body Mass Index (BMI) 40.0 and over, adult: Secondary | ICD-10-CM

## 2019-07-09 DIAGNOSIS — A419 Sepsis, unspecified organism: Secondary | ICD-10-CM

## 2019-07-09 DIAGNOSIS — R6521 Severe sepsis with septic shock: Secondary | ICD-10-CM

## 2019-07-09 DIAGNOSIS — G9341 Metabolic encephalopathy: Secondary | ICD-10-CM

## 2019-07-09 LAB — CBC WITH DIFFERENTIAL/PLATELET
Abs Immature Granulocytes: 0.44 10*3/uL — ABNORMAL HIGH (ref 0.00–0.07)
Basophils Absolute: 0.1 10*3/uL (ref 0.0–0.1)
Basophils Relative: 0 %
Eosinophils Absolute: 0.2 10*3/uL (ref 0.0–0.5)
Eosinophils Relative: 1 %
HCT: 31.5 % — ABNORMAL LOW (ref 36.0–46.0)
Hemoglobin: 9.7 g/dL — ABNORMAL LOW (ref 12.0–15.0)
Immature Granulocytes: 2 %
Lymphocytes Relative: 4 %
Lymphs Abs: 0.8 10*3/uL (ref 0.7–4.0)
MCH: 24.3 pg — ABNORMAL LOW (ref 26.0–34.0)
MCHC: 30.8 g/dL (ref 30.0–36.0)
MCV: 78.8 fL — ABNORMAL LOW (ref 80.0–100.0)
Monocytes Absolute: 0.6 10*3/uL (ref 0.1–1.0)
Monocytes Relative: 3 %
Neutro Abs: 19.6 10*3/uL — ABNORMAL HIGH (ref 1.7–7.7)
Neutrophils Relative %: 90 %
Platelets: 212 10*3/uL (ref 150–400)
RBC: 4 MIL/uL (ref 3.87–5.11)
RDW: 16.6 % — ABNORMAL HIGH (ref 11.5–15.5)
WBC: 21.7 10*3/uL — ABNORMAL HIGH (ref 4.0–10.5)
nRBC: 0 % (ref 0.0–0.2)

## 2019-07-09 LAB — BLOOD GAS, ARTERIAL
Acid-base deficit: 3.3 mmol/L — ABNORMAL HIGH (ref 0.0–2.0)
Bicarbonate: 21.3 mmol/L (ref 20.0–28.0)
FIO2: 0.4
O2 Saturation: 96.6 %
Patient temperature: 37
pCO2 arterial: 36 mmHg (ref 32.0–48.0)
pH, Arterial: 7.38 (ref 7.350–7.450)
pO2, Arterial: 88 mmHg (ref 83.0–108.0)

## 2019-07-09 LAB — GLUCOSE, CAPILLARY
Glucose-Capillary: 289 mg/dL — ABNORMAL HIGH (ref 70–99)
Glucose-Capillary: 349 mg/dL — ABNORMAL HIGH (ref 70–99)
Glucose-Capillary: 358 mg/dL — ABNORMAL HIGH (ref 70–99)
Glucose-Capillary: 351 mg/dL — ABNORMAL HIGH (ref 70–99)
Glucose-Capillary: 303 mg/dL — ABNORMAL HIGH (ref 70–99)
Glucose-Capillary: 277 mg/dL — ABNORMAL HIGH (ref 70–99)
Glucose-Capillary: 292 mg/dL — ABNORMAL HIGH (ref 70–99)

## 2019-07-09 LAB — COMPREHENSIVE METABOLIC PANEL
ALT: 12 U/L (ref 0–44)
AST: 16 U/L (ref 15–41)
Albumin: 2.5 g/dL — ABNORMAL LOW (ref 3.5–5.0)
Alkaline Phosphatase: 67 U/L (ref 38–126)
Anion gap: 8 (ref 5–15)
BUN: 35 mg/dL — ABNORMAL HIGH (ref 6–20)
CO2: 23 mmol/L (ref 22–32)
Calcium: 7.8 mg/dL — ABNORMAL LOW (ref 8.9–10.3)
Chloride: 104 mmol/L (ref 98–111)
Creatinine, Ser: 1.26 mg/dL — ABNORMAL HIGH (ref 0.44–1.00)
GFR calc Af Amer: 55 mL/min — ABNORMAL LOW (ref >60)
GFR calc non Af Amer: 47 mL/min — ABNORMAL LOW (ref >60)
Glucose, Bld: 348 mg/dL — ABNORMAL HIGH (ref 70–99)
Potassium: 3.7 mmol/L (ref 3.5–5.1)
Sodium: 135 mmol/L (ref 135–145)
Total Bilirubin: 0.9 mg/dL (ref 0.3–1.2)
Total Protein: 6.2 g/dL — ABNORMAL LOW (ref 6.5–8.1)

## 2019-07-09 LAB — HEPARIN LEVEL (UNFRACTIONATED)
Heparin Unfractionated: 0.39 IU/mL (ref 0.30–0.70)
Heparin Unfractionated: 0.4 IU/mL (ref 0.30–0.70)

## 2019-07-09 LAB — FIBRIN DERIVATIVES D-DIMER (ARMC ONLY): Fibrin derivatives D-dimer (ARMC): 2305.99 ng/mL (FEU) — ABNORMAL HIGH (ref 0.00–499.00)

## 2019-07-09 LAB — PROCALCITONIN: Procalcitonin: 17.96 ng/mL

## 2019-07-09 LAB — PHOSPHORUS: Phosphorus: 5.6 mg/dL — ABNORMAL HIGH (ref 2.5–4.6)

## 2019-07-09 LAB — MAGNESIUM: Magnesium: 1.4 mg/dL — ABNORMAL LOW (ref 1.7–2.4)

## 2019-07-09 LAB — URINE CULTURE: Culture: NO GROWTH

## 2019-07-09 LAB — TROPONIN I (HIGH SENSITIVITY): Troponin I (High Sensitivity): 24 ng/L — ABNORMAL HIGH (ref <18)

## 2019-07-09 MED ORDER — VANCOMYCIN HCL 1250 MG/250ML IV SOLN
1250.0000 mg | Freq: Two times a day (BID) | INTRAVENOUS | Status: DC
  Administered 2019-07-09 – 2019-07-10 (×3): 1250 mg via INTRAVENOUS
  Filled 2019-07-09 (×4): qty 250

## 2019-07-09 MED ORDER — LORAZEPAM 2 MG/ML IJ SOLN
0.5000 mg | INTRAMUSCULAR | Status: DC | PRN
  Administered 2019-07-09 – 2019-07-10 (×4): 0.5 mg via INTRAVENOUS
  Filled 2019-07-09 (×5): qty 1

## 2019-07-09 MED ORDER — DEXMEDETOMIDINE HCL IN NACL 400 MCG/100ML IV SOLN
0.4000 ug/kg/h | INTRAVENOUS | Status: DC
  Administered 2019-07-09 (×3): 0.8 ug/kg/h via INTRAVENOUS
  Administered 2019-07-09: 1.2 ug/kg/h via INTRAVENOUS
  Administered 2019-07-09 (×2): 1 ug/kg/h via INTRAVENOUS
  Administered 2019-07-09 – 2019-07-10 (×3): 1.2 ug/kg/h via INTRAVENOUS
  Administered 2019-07-10: 0.6 ug/kg/h via INTRAVENOUS
  Administered 2019-07-10 (×3): 1.2 ug/kg/h via INTRAVENOUS
  Administered 2019-07-11: 0.4 ug/kg/h via INTRAVENOUS
  Administered 2019-07-11: 0.7 ug/kg/h via INTRAVENOUS
  Administered 2019-07-11: 1.2 ug/kg/h via INTRAVENOUS
  Filled 2019-07-09 (×17): qty 100

## 2019-07-09 MED ORDER — MAGNESIUM SULFATE 2 GM/50ML IV SOLN
2.0000 g | Freq: Once | INTRAVENOUS | Status: AC
  Administered 2019-07-09: 2 g via INTRAVENOUS
  Filled 2019-07-09: qty 50

## 2019-07-09 MED ORDER — MAGNESIUM SULFATE 2 GM/50ML IV SOLN
2.0000 g | INTRAVENOUS | Status: AC
  Administered 2019-07-09: 2 g via INTRAVENOUS
  Filled 2019-07-09: qty 50

## 2019-07-09 MED ORDER — HEPARIN (PORCINE) 25000 UT/250ML-% IV SOLN
1600.0000 [IU]/h | INTRAVENOUS | Status: DC
  Administered 2019-07-09 – 2019-07-10 (×3): 1600 [IU]/h via INTRAVENOUS
  Filled 2019-07-09 (×3): qty 250

## 2019-07-09 MED ORDER — VANCOMYCIN HCL IN DEXTROSE 1-5 GM/200ML-% IV SOLN
1000.0000 mg | Freq: Two times a day (BID) | INTRAVENOUS | Status: DC
  Filled 2019-07-09: qty 200

## 2019-07-09 MED ORDER — INSULIN GLARGINE 100 UNIT/ML ~~LOC~~ SOLN
15.0000 [IU] | Freq: Every day | SUBCUTANEOUS | Status: DC
  Filled 2019-07-09: qty 0.15

## 2019-07-09 MED ORDER — SODIUM CHLORIDE 0.9 % IV SOLN: 4.0000 g | Freq: Once | INTRAVENOUS | Status: DC

## 2019-07-09 MED ORDER — HEPARIN BOLUS VIA INFUSION
4000.0000 [IU] | Freq: Once | INTRAVENOUS | Status: AC
  Administered 2019-07-09: 4000 [IU] via INTRAVENOUS
  Filled 2019-07-09: qty 4000

## 2019-07-09 MED ORDER — IOHEXOL 350 MG/ML SOLN
75.0000 mL | Freq: Once | INTRAVENOUS | Status: AC | PRN
  Administered 2019-07-09: 75 mL via INTRAVENOUS

## 2019-07-09 MED ORDER — INSULIN DETEMIR 100 UNIT/ML ~~LOC~~ SOLN
20.0000 [IU] | Freq: Two times a day (BID) | SUBCUTANEOUS | Status: DC
  Administered 2019-07-09 – 2019-07-10 (×3): 20 [IU] via SUBCUTANEOUS
  Filled 2019-07-09 (×4): qty 0.2

## 2019-07-09 NOTE — Progress Notes (Signed)
Inpatient Diabetes Program Recommendations  AACE/ADA: New Consensus Statement on Inpatient Glycemic Control   Target Ranges:  Prepandial:   less than 140 mg/dL      Peak postprandial:   less than 180 mg/dL (1-2 hours)      Critically ill patients:  140 - 180 mg/dL   Results for [name] [name], [name] [name] (MRN [id number]) as of 07/09/2019 09:33  Ref. Range 07/08/2019 07:34 07/08/2019 11:36 07/08/2019 16:29 07/08/2019 21:27 07/09/2019 07:20  Glucose-Capillary Latest Ref Range: 70 - 99 mg/dL 397 (H)  Novolog 15 units 255 (H)  Novolog 11 units  Lantus 10 units 285 (H)  Novolog 11 units 287 (H)  Novolog 3 units 349 (H)   Review of Glycemic Control  Diabetes history: DM2 Outpatient Diabetes medications: Tresiba 65-100 units daily, Tradjenta 5 mg daily Current orders for Inpatient glycemic control: Lantus 15 units daily, Novolog 0-20 units TID with meals, Novolog 0-5 units QHS; Solucortef 50 mg Q6H  Inpatient Diabetes Program Recommendations:   Insulin-Correction: May want to consider changing CBGs and Novolog correction to Q4H for closer glucose monitoring and correction.  Insulin - Basal: If steroids are continued, please consider discontinuing Lantus and ordering Levemir 20 units BID (peak of Levemir tends to work better when steroids are ordered and BID dosing will allow for adjustments to be made sooner).  Thanks, Orlando Penner, RN, MSN, CDE Diabetes Coordinator Inpatient Diabetes Program [phone] (Team Pager from 8am to 5pm)

## 2019-07-09 NOTE — Progress Notes (Signed)
PHARMACY CONSULT NOTE  Pharmacy Consult for Electrolyte Monitoring and Replacement   Recent Labs: Potassium (mmol/L)  Date Value  07/09/2019 3.7  05/27/2014 3.8   Magnesium (mg/dL)  Date Value  02/77/4128 1.4 (L)  12/02/2013 1.1 (L)   Calcium (mg/dL)  Date Value  78/67/6720 7.8 (L)   Calcium, Total (mg/dL)  Date Value  94/70/9628 8.5   Albumin (g/dL)  Date Value  36/62/9476 2.5 (L)  11/16/2018 4.2  12/03/2013 2.7 (L)   Phosphorus (mg/dL)  Date Value  54/65/0354 5.6 (H)   Sodium (mmol/L)  Date Value  07/09/2019 135  11/16/2018 134  05/27/2014 136     Assessment: 59 year old female admitted with difficulty breathing. Patient recently hospitalized 04/06/19 to 05/04/19 post-COVID. Pharmacy consulted for management of electrolytes and glucose control.  Goal of Therapy:  Electrolytes WNL Glucose < 180 (ideally)  Plan:  Electrolytes:  -Mg 1.4, IV Mg 4 g IV x 1 ordered -All other electrolytes appropriate -Scr 0.90 >> 1.39 >> 1.26 -LBM 3/19 -Follow-up AM labs tomorrow   Glucose (3/21 Hgb A1C 11.6%):  -Un-controlled at this time -On stress dose steroids -Poor PO intake -Start levemir 20 units BID per diabetes coordinator recommendations -Continue SSI -On Tresiba and linagliptin PTA   Tressie Ellis  Pharmacy Resident 07/09/2019 11:05 AM

## 2019-07-09 NOTE — TOC Initial Note (Signed)
Transition of Care [facility]) - Initial/Assessment Note    Patient Details  Name: [name] [name] MRN: [id number] Date of Birth: 03-04-1961  [facility] CM/SW Contact:    Magnus Ivan, LCSW Phone Number: 07/09/2019, 2:45 PM  Clinical Narrative:                Per chart, patient is disoriented x 4. CSW completed Readmission Screen with patient's daughter, Carolyne Fiscal. Chasity reported patient lived with her until about a week ago when patient moved into her own place where she lived alone. Chasity reported patient goes to [facility] for PCP and uses a mail order pharmacy out of [place], [place]. Chasity denied issues with patient obtaining medications. Patient's mother provides transportation for patient. Chasity reported patient went to a SNF years ago but could not recall which one. Chasity said patient has used [facility] in the past, but was not active with them before this hospitalization. Chasity said patient has oxygen and a walker at home. Chasity was encouraged to reach out if needs arise. CSW will continue to follow.      Barriers to Discharge: Continued Medical Work up   Patient Goals and CMS Choice        Expected Discharge Plan and Services         Living arrangements for the past 2 months: Single Family Home                                      Prior Living Arrangements/Services Living arrangements for the past 2 months: Single Family Home Lives with:: Self Patient language and need for interpreter reviewed:: Yes        Need for Family Participation in Patient Care: Yes (Comment) Care giver support system in place?: Yes (comment) Current home services: DME Criminal Activity/Legal Involvement Pertinent to Current Situation/Hospitalization: No - Comment as needed  Activities of Daily Living      Permission Sought/Granted                  Emotional Assessment       Orientation: : (Patient disoriented x 4 per chart  review.) Alcohol / Substance Use: Not Applicable Psych Involvement: No (comment)  Admission diagnosis:  Pyelonephritis [N12] Atypical pneumonia [J18.9] Septic shock ([place]) [A41.9, R65.21] Severe sepsis ([place]) [A41.9, R65.20] History of COVID-19 [Z86.16] Patient Active Problem List   Diagnosis Date Noted  . Acute metabolic encephalopathy 21/30/8657  . Hyperglycemia due to type 2 diabetes mellitus ([place]) 07/08/2019  . Morbid obesity with BMI of 50.0-59.9, adult ([place]) 07/08/2019  . History of COVID-19 07/08/2019  . Severe sepsis with septic shock ([place]) 07/08/2019  . Septic shock ([place]) 07/08/2019  . Acute respiratory disease due to COVID-19 virus 04/06/2019  . Type II diabetes mellitus with renal manifestations ([place]) 04/06/2019  . CKD (chronic kidney disease), stage III 04/06/2019  . HLD (hyperlipidemia) 04/06/2019  . COVID-19 03/27/2019  . Pneumonia due to COVID-19 virus 03/27/2019  . Abdominal pain 02/12/2019  . AKI (acute kidney injury) ([place]) 02/12/2019  . Cellulitis, abdominal wall 02/04/2019  . Elevated C-reactive protein (CRP) 12/15/2018  . Elevated sed rate 12/15/2018  . Pharmacologic therapy 11/06/2018  . Disorder of skeletal system 11/06/2018  . Problems influencing health status 11/06/2018  . Decubitus ulcer of heel, bilateral 02/21/2018  . Diabetic ulcer of toe of right foot associated with type 2 diabetes mellitus ([place]) 02/14/2018  .  Ulcer of left heel and midfoot with fat layer exposed (HCC) 02/14/2018  . MRSA (methicillin resistant Staphylococcus aureus) infection 11/17/2017  . Cellulitis 11/15/2017  . Perineal abscess 11/15/2017  . Subacute vulvitis 11/01/2017  . Chronic cystitis 03/01/2017  . Chronic pain syndrome 03/31/2016  . Insomnia secondary to chronic pain 03/31/2016  . Chronic upper back pain 12/25/2015  . Chronic hand pain (Bilateral) (L>R) 12/25/2015  . Rheumatoid arthritis (HCC) 12/25/2015  . Osteoarthritis, multiple sites 12/24/2015  . Chronic foot  pain (Primary Area of Pain) (Bilateral) (L>R) 12/24/2015  . Chronic elbow pain (Third area of Pain) (Bilateral) (L>R) 12/24/2015  . Chronic shoulder pain (Bilateral) (L>R) 12/24/2015  . Chronic neck pain (Bilateral) (R>L) 12/24/2015  . 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[employer]) 12/23/2015  . Chronic knee pain (Bilateral) (R>L) 12/23/2015  . Long term current use of opiate analgesic 12/23/2015  . Long term prescription opiate use 12/23/2015  . Opiate use (30 MME/Day) 12/23/2015  . Neurogenic pain 12/23/2015  . Neuropathic pain 12/23/2015  . Diabetic peripheral neuropathy (HCC) 12/23/2015  . Encounter for therapeutic drug level monitoring 12/23/2015  . Encounter for pain management planning 12/23/2015  . GERD (gastroesophageal reflux disease) 11/25/2015  . Neuropathy 11/25/2015  . Diarrhea 10/22/2015  . Hypokalemia 10/21/2015  . Hypomagnesemia 10/21/2015  . QT prolongation 10/21/2015  . 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 Chronic wrist pain (Secondary area of Pain) (Bilateral) (L>R) 03/19/2015  . 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 Female genuine stress incontinence 02/14/2015  . Urge incontinence of urine 02/14/2015  . Obstructive apnea 07/03/2014  . Chronic diastolic CHF (congestive heart failure) (HCC) 07/03/2014  . Anxiety 01/03/2014  . Diabetic ulcer of heel (HCC) 01/03/2014  . COPD (chronic obstructive pulmonary disease) (HCC) 01/03/2014  . Bipolar disorder, unspecified (HCC) 01/03/2014  . Diastolic dysfunction 01/03/2014  . Combined fat and carbohydrate induced hyperlipemia 01/03/2014  . Shortness of breath 01/03/2014  . Acute on chronic congestive heart failure (HCC) 01/03/2014  . Incomplete bladder emptying 11/02/2012  . Bladder retention 10/10/2012  . Detrusor muscle hypertonia 10/04/2012  . Obstruction of urinary tract 10/04/2012  . FOM (frequency of micturition) 10/04/2012  . Mixed incontinence 10/04/2012  . Vitamin D  insufficiency 04/15/2012  . Borderline personality disorder (HCC) 01/06/2012  . Mixed anxiety depressive disorder 01/06/2012  . History of laparoscopic adjustable gastric banding, 03/20/2007.  Removed 09/19/2011. 08/04/2011  . Nausea & vomiting 08/04/2011  . Hypothyroidism 06/28/2010  . Rheumatoid arthritis 06/28/2010  . Obesity 03/29/2005  . Essential (primary) hypertension 01/27/2005  . Major depressive disorder, recurrent episode, moderate (HCC) 06/03/2004   PCP:  Care, [place] Primary Pharmacy:   [facility] - [place], [place] - [address] [place] [address] Phone: [phone] Fax: [phone]     Social Determinants of Health (SDOH) Interventions    Readmission Risk Interventions Readmission Risk Prevention Plan 07/09/2019 05/03/2019  Transportation Screening Complete Complete  Medication Review (RN Care Manager) Complete Referral to Pharmacy  PCP or Specialist appointment within 3-5 days of discharge Complete Complete  HRI or Home Care Consult Complete Complete  Palliative Care Screening - Not Applicable  Skilled Nursing Facility - Not Applicable  Some recent data might be hidden

## 2019-07-09 NOTE — Progress Notes (Signed)
CRITICAL CARE NOTE BRIEF PATIENT DESCRIPTION:   59F hx of DM2, HTN, RA, MDD, HFpEF, OSA, BMI >50, s/p COVID19 with severe ARDS, came in febrile hypoxemic with elevated lactate and CT chest abd showing poss pyelo and spenic infarct. MRSA BACTEREMIA   SIGNIFICANT EVENTS/STUDIES:  03/21: Pt admitted to ICU with hypotension secondary to septic shock  MRSA BACTEREMIA 03/21: CT Chest/Abd/Pelvis revealed mixed ground-glass and consolidative opacity seen towards the lung apices bilaterally, poorly assessed given extensive respiratory motion artifact at the time of exam. Differential diagnosis could reflect residual or recurrent infection change in the setting of prior COVID-19. Extensive bilateral perinephric stranding and hazy retroperitoneal attenuation. The appearance is nonspecific, and can be seen with urinary tract infection/ pyelonephritis with minimal thickening of the bladder compatible with cystitis as well. Consider correlation with urinalysis. Questionable area of geographic hypoattenuation affecting the posterior spleen and periphery of the liver, may represent an area of developing splenic infarction. Marked laxity of the anterior abdominal wall with periumbilical postsurgical changes and a fat and simple fluid containing collection to the right of the umbilicus. Borderline enlarged left inguinal lymph nodes are similar to a comparison study 04/15/2013 and likely reactive. Aortic Atherosclerosis      CC  Follow up septic shock  SUBJECTIVE Patient remains critically ill Prognosis is guarded High risk for cardiac arrest and MV support    BP 123/67   Pulse 99   Temp 98.6 F (37 C) (Oral)   Resp (!) 26   Ht 5\' 10"  (1.778 m)   Wt (!) 158.8 kg   LMP 04/20/2001   SpO2 94%   BMI 50.22 kg/m    I/O last 3 completed shifts: In: 3396.7 [I.V.:586.7; IV Piggyback:2810] Out: 530 [Urine:530] No intake/output data recorded.  SpO2: 94 % O2 Flow Rate (L/min): 5 L/min FiO2 (%): 40  %  Estimated body mass index is 50.22 kg/m as calculated from the following:   Height as of this encounter: 5\' 10"  (1.778 m).   Weight as of this encounter: 158.8 kg.  SIGNIFICANT EVENTS   REVIEW OF SYSTEMS  PATIENT IS UNABLE TO PROVIDE COMPLETE REVIEW OF SYSTEMS DUE TO SEVERE CRITICAL ILLNESS        PHYSICAL EXAMINATION:  GENERAL:critically ill appearing, +resp distress HEAD: Normocephalic, atraumatic.  EYES: Pupils equal, round, reactive to light.  No scleral icterus.  MOUTH: Moist mucosal membrane. NECK: Supple.  PULMONARY: +rhonchi,  CARDIOVASCULAR: S1 and S2. Regular rate and rhythm. No murmurs, rubs, or gallops.  GASTROINTESTINAL: Soft, nontender, -distended.  Positive bowel sounds.   MUSCULOSKELETAL: No swelling, clubbing, or edema.  NEUROLOGIC: obtunded, GCS<8 SKIN:intact,warm,dry  MEDICATIONS: I have reviewed all medications and confirmed regimen as documented   CULTURE RESULTS   Recent Results (from the past 240 hour(s))  Blood Culture (routine x 2)     Status: None (Preliminary result)   Collection Time: 07/08/19 12:14 AM   Specimen: BLOOD  Result Value Ref Range Status   Specimen Description BLOOD LEFT ANTECUBITAL  Final   Special Requests   Final    BOTTLES DRAWN AEROBIC AND ANAEROBIC Blood Culture adequate volume   Culture  Setup Time   Final    Organism ID to follow GRAM POSITIVE COCCI IN BOTH AEROBIC AND ANAEROBIC BOTTLES CRITICAL RESULT CALLED TO, READ BACK BY AND VERIFIED WITH: ABBY ELLINGTON AT ON 07/08/19 [facility] Performed at [place] [facility], [address], [place], [address] [place]    Culture [facility] POSITIVE COCCI  Final   Report Status PENDING  Incomplete  Blood Culture (routine x 2)     Status: None (Preliminary result)   Collection Time: 07/08/19 12:14 AM   Specimen: BLOOD  Result Value Ref Range Status   Specimen Description BLOOD RIGHT ANTECUBITAL  Final   Special Requests   Final    BOTTLES DRAWN AEROBIC AND ANAEROBIC Blood  Culture results may not be optimal due to an excessive volume of blood received in culture bottles   Culture  Setup Time   Final    GRAM POSITIVE COCCI IN BOTH AEROBIC AND ANAEROBIC BOTTLES CRITICAL VALUE NOTED.  VALUE IS CONSISTENT WITH PREVIOUSLY REPORTED AND CALLED VALUE. Performed at [facility], [address] [place], [place] [address]    Culture [facility] POSITIVE COCCI  Final   Report Status PENDING  Incomplete  Blood Culture ID Panel (Reflexed)     Status: Abnormal   Collection Time: 07/08/19 12:14 AM  Result Value Ref Range Status   Enterococcus species NOT DETECTED NOT DETECTED Final   Listeria monocytogenes NOT DETECTED NOT DETECTED Final   Staphylococcus species DETECTED (A) NOT DETECTED Final    Comment: CRITICAL RESULT CALLED TO, READ BACK BY AND VERIFIED WITH: CHARLES SHANLEVER AT 1136 ON 07/08/19 [facility]    Staphylococcus aureus (BCID) DETECTED (A) NOT DETECTED Final    Comment: Methicillin (oxacillin)-resistant Staphylococcus aureus (MRSA). MRSA is predictably resistant to beta-lactam antibiotics (except ceftaroline). Preferred therapy is vancomycin unless clinically contraindicated. Patient requires contact precautions if  hospitalized. CRITICAL RESULT CALLED TO, READ BACK BY AND VERIFIED WITH: CHARLES SHANLEVER AT 1136 ON 07/08/19 [facility]    Methicillin resistance DETECTED (A) NOT DETECTED Final    Comment: CRITICAL RESULT CALLED TO, READ BACK BY AND VERIFIED WITH: CHARLES SHANLEVER AT 1136 ON 07/08/19 [facility]    Streptococcus species NOT DETECTED NOT DETECTED Final   Streptococcus agalactiae NOT DETECTED NOT DETECTED Final   Streptococcus pneumoniae NOT DETECTED NOT DETECTED Final   Streptococcus pyogenes NOT DETECTED NOT DETECTED Final   Acinetobacter baumannii NOT DETECTED NOT DETECTED Final   Enterobacteriaceae species NOT DETECTED NOT DETECTED Final   Enterobacter cloacae complex NOT DETECTED NOT DETECTED Final   Escherichia coli NOT DETECTED NOT DETECTED Final    Klebsiella oxytoca NOT DETECTED NOT DETECTED Final   Klebsiella pneumoniae NOT DETECTED NOT DETECTED Final   Proteus species NOT DETECTED NOT DETECTED Final   Serratia marcescens NOT DETECTED NOT DETECTED Final   Haemophilus influenzae NOT DETECTED NOT DETECTED Final   Neisseria meningitidis NOT DETECTED NOT DETECTED Final   Pseudomonas aeruginosa NOT DETECTED NOT DETECTED Final   Candida albicans NOT DETECTED NOT DETECTED Final   Candida glabrata NOT DETECTED NOT DETECTED Final   Candida krusei NOT DETECTED NOT DETECTED Final   Candida parapsilosis NOT DETECTED NOT DETECTED Final   Candida tropicalis NOT DETECTED NOT DETECTED Final    Comment: Performed at [facility], [address] [place], [place] [address]  Respiratory Panel by RT PCR (Flu A&B, Covid) - Nasopharyngeal Swab     Status: None   Collection Time: 07/08/19  1:18 AM   Specimen: Nasopharyngeal Swab  Result Value Ref Range Status   SARS Coronavirus 2 by RT PCR NEGATIVE NEGATIVE Final    Comment: (NOTE) SARS-CoV-2 target nucleic acids are NOT DETECTED. The SARS-CoV-2 RNA is generally detectable in upper respiratoy specimens during the acute phase of infection. The lowest concentration of SARS-CoV-2 viral copies this assay can detect is 131 copies/mL. A negative result does not preclude SARS-Cov-2 infection and should  not be used as the sole basis for treatment or other patient management decisions. A negative result may occur with  improper specimen collection/handling, submission of specimen other than nasopharyngeal swab, presence of viral mutation(s) within the areas targeted by this assay, and inadequate number of viral copies (<131 copies/mL). A negative result must be combined with clinical observations, patient history, and epidemiological information. The expected result is Negative. Fact Sheet for Patients:  https://www.moore.com/ Fact Sheet for Healthcare Providers:   https://www.young.biz/ This test is not yet ap proved or cleared by the [place] FDA and  has been authorized for detection and/or diagnosis of SARS-CoV-2 by FDA under an Emergency Use Authorization (EUA). This EUA will remain  in effect (meaning this test can be used) for the duration of the COVID-19 declaration under Section 564(b)(1) of the Act, 21 U.S.C. section 360bbb-3(b)(1), unless the authorization is terminated or revoked sooner.    Influenza A by PCR NEGATIVE NEGATIVE Final   Influenza B by PCR NEGATIVE NEGATIVE Final    Comment: (NOTE) The Xpert Xpress SARS-CoV-2/FLU/RSV assay is intended as an aid in  the diagnosis of influenza from Nasopharyngeal swab specimens and  should not be used as a sole basis for treatment. Nasal washings and  aspirates are unacceptable for Xpert Xpress SARS-CoV-2/FLU/RSV  testing. Fact Sheet for Patients: https://www.moore.com/ Fact Sheet for Healthcare Providers: https://www.young.biz/ This test is not yet approved or cleared by the [place] FDA and  has been authorized for detection and/or diagnosis of SARS-CoV-2 by  FDA under an Emergency Use Authorization (EUA). This EUA will remain  in effect (meaning this test can be used) for the duration of the  Covid-19 declaration under Section 564(b)(1) of the Act, 21  U.S.C. section 360bbb-3(b)(1), unless the authorization is  terminated or revoked. Performed at [facility], [address] [place], [place] [address]   MRSA PCR Screening     Status: Abnormal   Collection Time: 07/08/19  7:51 AM   Specimen: Nasal Mucosa; Nasopharyngeal  Result Value Ref Range Status   MRSA by PCR POSITIVE (A) NEGATIVE Final    Comment:        The GeneXpert MRSA Assay (FDA approved for NASAL specimens only), is one component of a comprehensive MRSA colonization surveillance program. It is not intended to diagnose MRSA infection  nor to guide or monitor treatment for MRSA infections. RESULT CALLED TO, READ BACK BY AND VERIFIED WITH: VICK GREGORY AT 1305 07/08/19.PMF Performed at [facility], [address] [place], [place] [address]           IMAGING    US Venous Img Lower Bilateral (DVT)  Result Date: 07/08/2019 CLINICAL DATA:  59 year old female with elevated D-dimer and a history of prior COVID infection in December 2020 EXAM: BILATERAL LOWER EXTREMITY VENOUS DOPPLER ULTRASOUND TECHNIQUE: Gray-scale sonography with graded compression, as well as color Doppler and duplex ultrasound were performed to evaluate the lower extremity deep venous systems from the level of the common femoral vein and including the common femoral, femoral, profunda femoral, popliteal and calf veins including the posterior tibial, peroneal and gastrocnemius veins when visible. The superficial great saphenous vein was also interrogated. Spectral Doppler was utilized to evaluate flow at rest and with distal augmentation maneuvers in the common femoral, femoral and popliteal veins. COMPARISON:  None. FINDINGS: RIGHT LOWER EXTREMITY Common Femoral Vein: No evidence of thrombus. Normal compressibility, respiratory phasicity and response to augmentation. Saphenofemoral Junction: No evidence of thrombus. Normal compressibility and flow on  color Doppler imaging. Profunda Femoral Vein: No evidence of thrombus. Normal compressibility and flow on color Doppler imaging. Femoral Vein: No evidence of thrombus. Normal compressibility, respiratory phasicity and response to augmentation. Popliteal Vein: No evidence of thrombus. Normal compressibility, respiratory phasicity and response to augmentation. Calf Veins: No evidence of thrombus. Normal compressibility and flow on color Doppler imaging. Superficial Great Saphenous Vein: No evidence of thrombus. Normal compressibility. Venous Reflux:  None. Other Findings:  None. LEFT LOWER EXTREMITY Common  Femoral Vein: No evidence of thrombus. Normal compressibility, respiratory phasicity and response to augmentation. Saphenofemoral Junction: No evidence of thrombus. Normal compressibility and flow on color Doppler imaging. Profunda Femoral Vein: No evidence of thrombus. Normal compressibility and flow on color Doppler imaging. Femoral Vein: No evidence of thrombus. Normal compressibility, respiratory phasicity and response to augmentation. Popliteal Vein: No evidence of thrombus. Normal compressibility, respiratory phasicity and response to augmentation. Calf Veins: No evidence of thrombus. Normal compressibility and flow on color Doppler imaging. Superficial Great Saphenous Vein: No evidence of thrombus. Normal compressibility. Venous Reflux:  None. Other Findings:  None. IMPRESSION: No evidence of deep venous thrombosis in either lower extremity. Electronically Signed   By: Malachy Moan M.D.   On: 07/08/2019 09:54   ECHOCARDIOGRAM COMPLETE  Result Date: 07/08/2019    ECHOCARDIOGRAM REPORT   Patient Name:   [name] [name] Date of Exam: 07/08/2019 Medical Rec #:  [id number]       Height:       70.0 in Accession #:    [id number]      Weight:       350.0 lb Date of Birth:  1960/06/19       BSA:          2.648 m Patient Age:    58 years        BP:           99/52 mmHg Patient Gender: F               HR:           113 bpm. Exam Location:  [facility] Procedure: 2D Echo STAT ECHO Indications:     Chest Pain 786.50/ R07.9  History:         Patient has prior history of Echocardiogram examinations, most                  recent 04/10/2019.  Sonographer:     Wonda Cerise RDCS Referring Phys:  [id number] Eugenie Norrie Diagnosing Phys: Julien Nordmann MD  Sonographer Comments: Technically difficult study due to poor echo windows and patient is morbidly obese. Image acquisition challenging due to patient body habitus. IMPRESSIONS  1. Left ventricular ejection fraction, by estimation, is 60 to 65%. The left ventricle has normal  function. The left ventricle has no regional wall motion abnormalities. Left ventricular diastolic parameters are consistent with Grade I diastolic dysfunction (impaired relaxation).  2. Right ventricular systolic function is normal. The right ventricular size is normal.  3. The inferior vena cava is normal in size with greater than 50% respiratory variability, suggesting right atrial pressure of 3 mmHg. FINDINGS  Left Ventricle: Left ventricular ejection fraction, by estimation, is 60 to 65%. The left ventricle has normal function. The left ventricle has no regional wall motion abnormalities. The left ventricular internal cavity size was normal in size. There is  no left ventricular hypertrophy. Left ventricular diastolic parameters are consistent with Grade I diastolic dysfunction (impaired relaxation). Right Ventricle: The right ventricular  size is normal. No increase in right ventricular wall thickness. Right ventricular systolic function is normal. Left Atrium: Left atrial size was normal in size. Right Atrium: Right atrial size was normal in size. Pericardium: There is no evidence of pericardial effusion. Mitral Valve: The mitral valve is normal in structure. Normal mobility of the mitral valve leaflets. No evidence of mitral valve regurgitation. No evidence of mitral valve stenosis. Tricuspid Valve: The tricuspid valve is normal in structure. Tricuspid valve regurgitation is mild . No evidence of tricuspid stenosis. Aortic Valve: The aortic valve was not well visualized. Aortic valve regurgitation is not visualized. No aortic stenosis is present. Aortic valve peak gradient measures 11.6 mmHg. Pulmonic Valve: The pulmonic valve was normal in structure. Pulmonic valve regurgitation is not visualized. No evidence of pulmonic stenosis. Aorta: The aortic root is normal in size and structure. Venous: The inferior vena cava is normal in size with greater than 50% respiratory variability, suggesting right atrial  pressure of 3 mmHg. IAS/Shunts: No atrial level shunt detected by color flow Doppler.  LEFT VENTRICLE PLAX 2D LVIDd:         4.39 cm  Diastology LVIDs:         2.99 cm  LV e' lateral:   9.90 cm/s LV PW:         1.36 cm  LV E/e' lateral: 9.6 LV IVS:        1.38 cm  LV e' medial:    4.68 cm/s LVOT diam:     2.10 cm  LV E/e' medial:  20.3 LV SV:         88 LV SV Index:   33 LVOT Area:     3.46 cm  RIGHT VENTRICLE RV Basal diam:  3.01 cm RV S prime:     14.60 cm/s TAPSE (M-mode): 1.7 cm LEFT ATRIUM             Index       RIGHT ATRIUM           Index LA diam:        2.40 cm 0.91 cm/m  RA Area:     14.70 cm LA Vol (A2C):   37.0 ml 13.97 ml/m RA Volume:   36.90 ml  13.94 ml/m LA Vol (A4C):   52.9 ml 19.98 ml/m LA Biplane Vol: 45.6 ml 17.22 ml/m  AORTIC VALVE                PULMONIC VALVE AV Area (Vmax): 2.77 cm    PV Vmax:       1.13 m/s AV Vmax:        170.00 cm/s PV Peak grad:  5.1 mmHg AV Peak Grad:   11.6 mmHg LVOT Vmax:      136.00 cm/s LVOT Vmean:     93.400 cm/s LVOT VTI:       0.255 m  AORTA Ao Root diam: 3.00 cm MITRAL VALVE MV Area (PHT): 5.54 cm     SHUNTS MV Decel Time: 137 msec     Systemic VTI:  0.26 m MV E velocity: 95.10 cm/s   Systemic Diam: 2.10 cm MV A velocity: 106.00 cm/s MV E/A ratio:  0.90 Julien Nordmann MD Electronically signed by Julien Nordmann MD Signature Date/Time: 07/08/2019/10:57:12 AM    Final      Nutrition Status:        CBC    Component Value Date/Time   WBC 21.7 (H) 07/09/2019 0420   RBC 4.00 07/09/2019 0420  HGB 9.7 (L) 07/09/2019 0420   HGB 12.1 05/27/2014 1439   HCT 31.5 (L) 07/09/2019 0420   HCT 37.5 05/27/2014 1439   PLT 212 07/09/2019 0420   PLT 221 05/27/2014 1439   MCV 78.8 (L) 07/09/2019 0420   MCV 80 05/27/2014 1439   MCH 24.3 (L) 07/09/2019 0420   MCHC 30.8 07/09/2019 0420   RDW 16.6 (H) 07/09/2019 0420   RDW 17.3 (H) 05/27/2014 1439   LYMPHSABS 0.8 07/09/2019 0420   LYMPHSABS 1.6 12/03/2013 0511   MONOABS 0.6 07/09/2019 0420   MONOABS 0.5  12/03/2013 0511   EOSABS 0.2 07/09/2019 0420   EOSABS 0.0 12/03/2013 0511   BASOSABS 0.1 07/09/2019 0420   BASOSABS 0.1 12/03/2013 0511   BMP Latest Ref Rng & Units 07/09/2019 07/08/2019 07/08/2019  Glucose 70 - 99 mg/dL 348(H) 309(H) 413(H)  BUN 6 - 20 mg/dL 35(H) 30(H) 17  Creatinine 0.44 - 1.00 mg/dL 1.26(H) 1.39(H) 0.90  BUN/Creat Ratio 9 - 23 - - -  Sodium 135 - 145 mmol/L 135 134(L) 134(L)  Potassium 3.5 - 5.1 mmol/L 3.7 4.0 3.7  Chloride 98 - 111 mmol/L 104 101 97(L)  CO2 22 - 32 mmol/L 23 23 26   Calcium 8.9 - 10.3 mg/dL 7.8(L) 7.9(L) 9.1      Indwelling Urinary Catheter continued, requirement due to   Reason to continue Indwelling Urinary Catheter strict Intake/Output monitoring for hemodynamic instability   Central Line/ continued, requirement due to  Reason to continue [place] of central venous pressure or other hemodynamic parameters and poor IV access       ASSESSMENT AND PLAN SYNOPSIS    SHOCK-SEPSIS/HYPOVOLUMIC/CARDIOGENIC MRSA BACTERMIA?SOURCE -use vasopressors to keep MAP>65 -follow ABG and LA -follow up cultures -consider stress dose steroids -aggressive IV fluid resuscitation  POST COVID PNEUMONIA with sequale of ILD Oxygen as needed High risk for intubation   ACUTE KIDNEY INJURY/Renal Failure -follow chem 7 -follow UO -continue Foley Catheter-assess need -Avoid nephrotoxic agents -Recheck creatinine    NEUROLOGY Acute toxic metabolic encephalopathy from septic shock On precedex   ACUTE DIASTOLIC CARDIAC FAILURE-  Supportive care    CARDIAC ICU monitoring  ID -continue IV abx as prescibed -follow up cultures Follow up ID consultation  GI GI PROPHYLAXIS as indicated  NUTRITIONAL STATUS Nutrition Status:         DIET-->NPO Constipation protocol as indicated  ENDO - will use ICU hypoglycemic\Hyperglycemia protocol if indicated   ELECTROLYTES -follow labs as needed -replace as needed -pharmacy  consultation and following  Estimated body mass index is 50.22 kg/m as calculated from the following:   Height as of this encounter: 5\' 10"  (1.778 m).   Weight as of this encounter: 158.8 kg.  DVT/GI PRX ordered TRANSFUSIONS AS NEEDED MONITOR FSBS ASSESS the need for LABS as needed   Critical Care Time devoted to patient care services described in this note is 35 minutes.   Overall, patient is critically ill, prognosis is guarded.  Patient with Multiorgan failure and at high risk for cardiac arrest and death.   PALLIATIVE CARE CONSULTED  Corrin Parker, M.D.  [facility]  Medical Director [place] Director [facility] Cardio-Pulmonary Department

## 2019-07-09 NOTE — Consult Note (Signed)
Urology Consult  I have been asked to see the patient by Dr. Belia Heman, for evaluation and management of InterStim device in the setting of MRSA bacteremia.  Chief Complaint: AMS  History of Present Illness: [name] [name] is a 59 y.o. year old comorbid female admitted on 07/07/2019 with septic shock secondary to MRSA bacteremia of unknown source. Urology was consulted to evaluate the patient's InterStim device as a possible source of infection. Admission urine culture negative.  Patient is somnolent today and unable to contribute to HPI. History gathered per chart review.  Device implanted in the left gluteal region circa November 2006. She was previously managed by Dr. Achilles Dunk, now by Dr. Raoul Pitch at [facility]. Interstim was noted to be nonfunctional secondary to a dead battery in February 24, 2018. Dr. Raoul Pitch has elected not to replace the battery secondary to the patient's medical comorbidities including poorly controlled diabetes, which would likely complicate healing and increase her infection risk. She was seen by Dr. Raoul Pitch as recently as January 2020 for routine follow-up with reports of bothersome urgency and urge incontinence.  Past Medical History:  Diagnosis Date  . Abdominal wall hernia 01/29/2013  . Anxiety   . Arthritis    Rheumatoid  . C. difficile colitis   . Chronic diastolic heart failure (HCC)   . COVID-19 03/23/2019   Diagnosed at [facility] (send-out) on 03/23/2019  . Depression   . Diabetes mellitus    states no meds or diet restrictions  at present  . Diastolic CHF (HCC)   . Esophagitis   . Fluid retention   . GERD (gastroesophageal reflux disease)   . Hiatal hernia   . Hypertension   . Hypokalemia due to loss of potassium 10/21/2015   Overview:  Associated with 3 weeks of diarrhea  And QT prolongation.  . Hypothyroidism   . IBS (irritable bowel syndrome)   . Moderate episode of recurrent major depressive disorder (HCC) 06/03/2004  . Morbid obesity (HCC)   . MRSA  (methicillin resistant Staphylococcus aureus) infection 11/2017   left inner thigh abcess  . Neurogenic bladder    has pacemaker  . Neuropathy   . Obesity   . Panic attacks   . Pneumonia due to COVID-19 virus   . Rheumatoid arthritis (HCC)   . Sleep apnea    STATES SEVERE, CANT TOLERATE MASK- LAST STUDY YEARS AGO    Past Surgical History:  Procedure Laterality Date  . ABDOMINAL HYSTERECTOMY    . CHOLECYSTECTOMY    . DG GREAT TOE RIGHT FOOT  02/23/2018  . EYE SURGERY     bilateral cataract extraction with IOL  . HERNIA REPAIR     ventral hernia with strangulation  . LAPAROSCOPIC GASTRIC BANDING  03/20/07  . TONSILLECTOMY    . TUBAL LIGATION      Home Medications:  No outpatient medications have been marked as taking for the 07/07/19 encounter [facility] Encounter).    Allergies:  Allergies  Allergen Reactions  . Cephalexin Hives  . Codeine Palpitations, Nausea Only, Nausea And Vomiting, Rash and Shortness Of Breath    "makes heart fly, she gets flushed and passes out"  . Doxycycline Rash  . Propoxyphene Rash and Shortness Of Breath    Increase heart rate  . Sulfa Antibiotics Palpitations, Nausea Only, Shortness Of Breath and Hives    "makes heart fly, she gets flushed and passes out"  . Lovenox [Enoxaparin Sodium] Hives  . Hydrocodone Nausea And Vomiting    Hear racing & breaks  out into a cold sweat.  . Meropenem Rash    Erythematous, hot, pruritic rash over arms, chest, back, abdomen, and face occurred at the end of meropenem infusion on 02/22/18    Family History  Problem Relation Age of Onset  . Heart failure Father   . Bipolar disorder Father   . Alcohol abuse Father   . Anxiety disorder Father   . Depression Father   . Heart disease Brother   . Heart attack Brother 81       MI s/p stents placed  . Anxiety disorder Sister   . Depression Sister   . Anxiety disorder Sister   . Depression Sister   . Bipolar disorder Sister   . Alcohol abuse Sister   . Drug  abuse Sister   . Heart attack Brother     Social History:  reports that she quit smoking about 19 years ago. Her smoking use included cigarettes. She has a 54.00 pack-year smoking history. She has never used smokeless tobacco. She reports that she does not drink alcohol or use drugs.  ROS: A complete review of systems was performed.  All systems are negative except for pertinent findings as noted.  Physical Exam:  Vital signs in last 24 hours: Temp:  [98.4 F (36.9 C)-101.8 F (38.8 C)] 98.4 F (36.9 C) (03/22 0800) Pulse Rate:  [61-103] 76 (03/22 1200) Resp:  [17-30] 20 (03/22 1200) BP: (85-133)/(39-67) 109/54 (03/22 1200) SpO2:  [93 %-100 %] 97 % (03/22 1200) Constitutional:  Somnolent HEENT: [place] AT Cardiovascular: No clubbing, cyanosis, or edema. Respiratory: Normal respiratory effort GU: InterStim insertion scar significantly faded and barely visible on the superior left buttock. Device not palpable. No erythema, edema, crepitus, or purulence of the left buttock. Skin: No rashes, bruises or suspicious lesions Neurologic: Unable to assess Psychiatric: Unable to assess  Laboratory Data:  Recent Labs    07/08/19 0014 07/09/19 0420  WBC 17.7* 21.7*  HGB 11.8* 9.7*  HCT 38.4 31.5*   Recent Labs    07/08/19 0014 07/08/19 2131 07/09/19 0420  NA 134* 134* 135  K 3.7 4.0 3.7  CL 97* 101 104  CO2 26 23 23   GLUCOSE 413* 309* 348*  BUN 17 30* 35*  CREATININE 0.90 1.39* 1.26*  CALCIUM 9.1 7.9* 7.8*   Recent Labs    07/08/19 0014  INR 1.3*   Urinalysis    Component Value Date/Time   COLORURINE YELLOW (A) 07/08/2019 0014   APPEARANCEUR CLEAR (A) 07/08/2019 0014   APPEARANCEUR Clear 11/30/2013 1010   LABSPEC 1.014 07/08/2019 0014   LABSPEC 1.015 11/30/2013 1010   PHURINE 5.0 07/08/2019 0014   GLUCOSEU >=500 (A) 07/08/2019 0014   GLUCOSEU >=500 11/30/2013 1010   HGBUR NEGATIVE 07/08/2019 0014   BILIRUBINUR NEGATIVE 07/08/2019 0014   BILIRUBINUR Negative  11/30/2013 1010   KETONESUR NEGATIVE 07/08/2019 0014   PROTEINUR NEGATIVE 07/08/2019 0014   UROBILINOGEN 0.2 03/14/2007 1000   NITRITE NEGATIVE 07/08/2019 0014   LEUKOCYTESUR TRACE (A) 07/08/2019 0014   LEUKOCYTESUR Negative 11/30/2013 1010   Results for orders placed or performed during the hospital encounter of 07/07/19  Urine culture     Status: None   Collection Time: 07/07/19 12:14 AM   Specimen: In/Out Cath Urine  Result Value Ref Range Status   Specimen Description   Final    IN/OUT CATH URINE Performed at [facility], [address] [place],  [address]    Special Requests   Final    NONE  Performed at [facility], [address] [place], [place] [address]    Culture   Final    NO GROWTH Performed at [facility], [address], [place], [place] [address]    Report Status 07/09/2019 FINAL  Final  Blood Culture (routine x 2)     Status: Abnormal (Preliminary result)   Collection Time: 07/08/19 12:14 AM   Specimen: BLOOD  Result Value Ref Range Status   Specimen Description   Final    BLOOD LEFT ANTECUBITAL Performed at [facility], [address] [place], [place] [address]    Special Requests   Final    BOTTLES DRAWN AEROBIC AND ANAEROBIC Blood Culture adequate volume Performed at [facility], [address] [place], [place] [address]    Culture  Setup Time   Final    GRAM POSITIVE COCCI IN BOTH AEROBIC AND ANAEROBIC BOTTLES CRITICAL RESULT CALLED TO, READ BACK BY AND VERIFIED WITH: ABBY ELLINGTON AT 2956 ON 07/08/19 SNG Performed at [facility], [address], [place], [place] [address]    Culture STAPHYLOCOCCUS AUREUS (A)  Final   Report Status PENDING  Incomplete  Blood Culture (routine x 2)     Status: Abnormal (Preliminary result)   Collection Time: 07/08/19 12:14 AM   Specimen: BLOOD  Result Value Ref Range Status   Specimen Description   Final    BLOOD RIGHT ANTECUBITAL Performed at  [facility], [address] [place], [place] [address]    Special Requests   Final    BOTTLES DRAWN AEROBIC AND ANAEROBIC Blood Culture results may not be optimal due to an excessive volume of blood received in culture bottles Performed at [facility], [address], [place], [place] [address]    Culture  Setup Time   Final    GRAM POSITIVE COCCI IN BOTH AEROBIC AND ANAEROBIC BOTTLES CRITICAL VALUE NOTED.  VALUE IS CONSISTENT WITH PREVIOUSLY REPORTED AND CALLED VALUE. Performed at [facility], [address] [place], [place] [address]    Culture STAPHYLOCOCCUS AUREUS (A)  Final   Report Status PENDING  Incomplete  Blood Culture ID Panel (Reflexed)     Status: Abnormal   Collection Time: 07/08/19 12:14 AM  Result Value Ref Range Status   Enterococcus species NOT DETECTED NOT DETECTED Final   Listeria monocytogenes NOT DETECTED NOT DETECTED Final   Staphylococcus species DETECTED (A) NOT DETECTED Final    Comment: CRITICAL RESULT CALLED TO, READ BACK BY AND VERIFIED WITH: CHARLES SHANLEVER AT 1136 ON 07/08/19 SNG    Staphylococcus aureus (BCID) DETECTED (A) NOT DETECTED Final    Comment: Methicillin (oxacillin)-resistant Staphylococcus aureus (MRSA). MRSA is predictably resistant to beta-lactam antibiotics (except ceftaroline). Preferred therapy is vancomycin unless clinically contraindicated. Patient requires contact precautions if  hospitalized. CRITICAL RESULT CALLED TO, READ BACK BY AND VERIFIED WITH: CHARLES SHANLEVER AT 1136 ON 07/08/19 SNG    Methicillin resistance DETECTED (A) NOT DETECTED Final    Comment: CRITICAL RESULT CALLED TO, READ BACK BY AND VERIFIED WITH: CHARLES SHANLEVER AT 1136 ON 07/08/19 SNG    Streptococcus species NOT DETECTED NOT DETECTED Final   Streptococcus agalactiae NOT DETECTED NOT DETECTED Final   Streptococcus pneumoniae NOT DETECTED NOT DETECTED Final   Streptococcus pyogenes NOT DETECTED NOT DETECTED Final    Acinetobacter baumannii NOT DETECTED NOT DETECTED Final   Enterobacteriaceae species NOT DETECTED NOT DETECTED Final   Enterobacter cloacae complex NOT DETECTED NOT DETECTED Final  Escherichia coli NOT DETECTED NOT DETECTED Final   Klebsiella oxytoca NOT DETECTED NOT DETECTED Final   Klebsiella pneumoniae NOT DETECTED NOT DETECTED Final   Proteus species NOT DETECTED NOT DETECTED Final   Serratia marcescens NOT DETECTED NOT DETECTED Final   Haemophilus influenzae NOT DETECTED NOT DETECTED Final   Neisseria meningitidis NOT DETECTED NOT DETECTED Final   Pseudomonas aeruginosa NOT DETECTED NOT DETECTED Final   Candida albicans NOT DETECTED NOT DETECTED Final   Candida glabrata NOT DETECTED NOT DETECTED Final   Candida krusei NOT DETECTED NOT DETECTED Final   Candida parapsilosis NOT DETECTED NOT DETECTED Final   Candida tropicalis NOT DETECTED NOT DETECTED Final    Comment: Performed at [facility], [address] [place], [place] [address]  Respiratory Panel by RT PCR (Flu A&B, Covid) - Nasopharyngeal Swab     Status: None   Collection Time: 07/08/19  1:18 AM   Specimen: Nasopharyngeal Swab  Result Value Ref Range Status   SARS Coronavirus 2 by RT PCR NEGATIVE NEGATIVE Final    Comment: (NOTE) SARS-CoV-2 target nucleic acids are NOT DETECTED. The SARS-CoV-2 RNA is generally detectable in upper respiratoy specimens during the acute phase of infection. The lowest concentration of SARS-CoV-2 viral copies this assay can detect is 131 copies/mL. A negative result does not preclude SARS-Cov-2 infection and should not be used as the sole basis for treatment or other patient management decisions. A negative result may occur with  improper specimen collection/handling, submission of specimen other than nasopharyngeal swab, presence of viral mutation(s) within the areas targeted by this assay, and inadequate number of viral copies (<131 copies/mL). A negative result must be  combined with clinical observations, patient history, and epidemiological information. The expected result is Negative. Fact Sheet for Patients:  https://www.moore.com/ Fact Sheet for Healthcare Providers:  https://www.young.biz/ This test is not yet ap proved or cleared by the [place] FDA and  has been authorized for detection and/or diagnosis of SARS-CoV-2 by FDA under an Emergency Use Authorization (EUA). This EUA will remain  in effect (meaning this test can be used) for the duration of the COVID-19 declaration under Section 564(b)(1) of the Act, 21 U.S.C. section 360bbb-3(b)(1), unless the authorization is terminated or revoked sooner.    Influenza A by PCR NEGATIVE NEGATIVE Final   Influenza B by PCR NEGATIVE NEGATIVE Final    Comment: (NOTE) The Xpert Xpress SARS-CoV-2/FLU/RSV assay is intended as an aid in  the diagnosis of influenza from Nasopharyngeal swab specimens and  should not be used as a sole basis for treatment. Nasal washings and  aspirates are unacceptable for Xpert Xpress SARS-CoV-2/FLU/RSV  testing. Fact Sheet for Patients: https://www.moore.com/ Fact Sheet for Healthcare Providers: https://www.young.biz/ This test is not yet approved or cleared by the [place] FDA and  has been authorized for detection and/or diagnosis of SARS-CoV-2 by  FDA under an Emergency Use Authorization (EUA). This EUA will remain  in effect (meaning this test can be used) for the duration of the  Covid-19 declaration under Section 564(b)(1) of the Act, 21  U.S.C. section 360bbb-3(b)(1), unless the authorization is  terminated or revoked. Performed at [facility], [address] [place], [place] [address]   MRSA PCR Screening     Status: Abnormal   Collection Time: 07/08/19  7:51 AM   Specimen: Nasal Mucosa; Nasopharyngeal  Result Value Ref Range Status   MRSA by PCR POSITIVE (A)  NEGATIVE Final    Comment:  The GeneXpert MRSA Assay (FDA approved for NASAL specimens only), is one component of a comprehensive MRSA colonization surveillance program. It is not intended to diagnose MRSA infection nor to guide or monitor treatment for MRSA infections. RESULT CALLED TO, READ BACK BY AND VERIFIED WITH: VICK GREGORY AT 1305 07/08/19.PMF Performed at [facility], [address], [place], [place] [address]     Assessment & Plan:  59 year old female with a history of refractory OAB s/p InterStim device placement circa 2006 admitted with septic shock secondary to MRSA bacteremia of unknown source.  No evidence of infection at the site of her InterStim implant, which has been nonfunctional for approximately 1.5 years due to dead battery.  Given the duration of the device being in place and the absence of erythema, edema, purulence, or crepitus in the region, it is extremely unlikely that the InterStim device is the source of her infection.  Recommend investigating alternative sources.   Thank you for involving me in this patient's care, please page with any further questions or concerns.  Carman Ching, PA-C 07/09/2019 3:55 PM

## 2019-07-09 NOTE — Progress Notes (Signed)
ANTICOAGULATION CONSULT NOTE - Initial Consult  Pharmacy Consult for Heparin Indication: VTE prophylaxis / R/O PE  Allergies  Allergen Reactions  . Cephalexin Hives  . Codeine Palpitations, Nausea Only, Nausea And Vomiting, Rash and Shortness Of Breath    "makes heart fly, she gets flushed and passes out"  . Doxycycline Rash  . Propoxyphene Rash and Shortness Of Breath    Increase heart rate  . Sulfa Antibiotics Palpitations, Nausea Only, Shortness Of Breath and Hives    "makes heart fly, she gets flushed and passes out"  . Lovenox [Enoxaparin Sodium] Hives  . Hydrocodone Nausea And Vomiting    Hear racing & breaks out into a cold sweat.  . Meropenem Rash    Erythematous, hot, pruritic rash over arms, chest, back, abdomen, and face occurred at the end of meropenem infusion on 02/22/18    Patient Measurements: Height: 5\' 10"  (177.8 cm) Weight: (!) 350 lb (158.8 kg) IBW/kg (Calculated) : 68.5 HEPARIN DW (KG): 107.6  Vital Signs: Temp: 98.4 F (36.9 C) (03/22 0800) Temp Source: Oral (03/22 0800) BP: 109/54 (03/22 1200) Pulse Rate: 76 (03/22 1200)  Labs: Recent Labs    07/08/19 0014 07/08/19 0702 07/08/19 2131 07/09/19 0420 07/09/19 0500 07/09/19 1246  HGB 11.8*  --   --  9.7*  --   --   HCT 38.4  --   --  31.5*  --   --   PLT 314  --   --  212  --   --   APTT  --  91*  --   --   --   --   LABPROT 15.6*  --   --   --   --   --   INR 1.3*  --   --   --   --   --   HEPARINUNFRC  --   --   --   --   --  0.39  CREATININE 0.90  --  1.39* 1.26*  --   --   TROPONINIHS  --  26*  --   --  24*  --     Estimated Creatinine Clearance: 81.3 mL/min (A) (by C-G formula based on SCr of 1.26 mg/dL (H)).   Medical History: Past Medical History:  Diagnosis Date  . Abdominal wall hernia 01/29/2013  . Anxiety   . Arthritis    Rheumatoid  . C. difficile colitis   . Chronic diastolic heart failure (HCC)   . COVID-19 03/23/2019   Diagnosed at [facility] (send-out) on 03/23/2019  .  Depression   . Diabetes mellitus    states no meds or diet restrictions  at present  . Diastolic CHF (HCC)   . Esophagitis   . Fluid retention   . GERD (gastroesophageal reflux disease)   . Hiatal hernia   . Hypertension   . Hypokalemia due to loss of potassium 10/21/2015   Overview:  Associated with 3 weeks of diarrhea  And QT prolongation.  . Hypothyroidism   . IBS (irritable bowel syndrome)   . Moderate episode of recurrent major depressive disorder (HCC) 06/03/2004  . Morbid obesity (HCC)   . MRSA (methicillin resistant Staphylococcus aureus) infection 11/2017   left inner thigh abcess  . Neurogenic bladder    has pacemaker  . Neuropathy   . Obesity   . Panic attacks   . Pneumonia due to COVID-19 virus   . Rheumatoid arthritis (HCC)   . Sleep apnea    STATES SEVERE, CANT TOLERATE MASK-  LAST STUDY YEARS AGO    Medications:  Medications Prior to Admission  Medication Sig Dispense Refill Last Dose  . ALPRAZolam (XANAX) 0.5 MG tablet Take 1 tablet (0.5 mg total) by mouth 2 (two) times daily as needed for anxiety. 10 tablet 0   . ARIPiprazole (ABILIFY) 5 MG tablet TAKE 1 TABLET BY MOUTH ONCE DAILY. (Patient taking differently: Take 5 mg by mouth daily. ) 28 tablet 11   . ascorbic acid (VITAMIN C) 500 MG tablet Take 1 tablet (500 mg total) by mouth daily. 30 tablet 0   . atorvastatin (LIPITOR) 80 MG tablet Take 80 mg by mouth daily at 6 PM.      . benzonatate (TESSALON PERLES) 100 MG capsule Take 1 capsule (100 mg total) by mouth 3 (three) times daily as needed for cough. 30 capsule 0   . budesonide (PULMICORT) 0.5 MG/2ML nebulizer solution Take 2 mLs (0.5 mg total) by nebulization 2 (two) times daily. 120 mL 11   . busPIRone (BUSPAR) 10 MG tablet TAKE 1 TABLET BY MOUTH TWICE DAILY (Patient taking differently: Take 10 mg by mouth 2 (two) times daily. ) 56 tablet 11   . Calcium Carb-Ergocalciferol 250-125 MG-UNIT TABS Take 1 tablet by mouth daily.      . Cholecalciferol (VITAMIN D3)  125 MCG (5000 UT) CAPS Take 1 capsule (5,000 Units total) by mouth daily with breakfast. Take along with calcium and magnesium. 90 capsule 3   . ciclesonide (ALVESCO) 160 MCG/ACT inhaler Inhale 4 puffs into the lungs 2 (two) times daily. 1 Inhaler 0   . dexamethasone (DECADRON) 1 MG tablet Take 2 mg daily for 3days,Take 1mg  daily for 3 days, Take 1mg  every other day for 3days,then stop 12 tablet 0   . Ensure Max Protein (ENSURE MAX PROTEIN) LIQD Take 330 mLs (11 oz total) by mouth 2 (two) times daily between meals. 10000 mL 0   . famotidine (PEPCID) 20 MG tablet Take 1 tablet (20 mg total) by mouth daily. 30 tablet 0   . FLUoxetine (PROZAC) 20 MG capsule Take 1 capsule (20 mg total) by mouth daily. TAKE (1) CAPSULE BY MOUTH ONCE DAILY. 30 capsule 6   . folic acid (FOLVITE) 1 MG tablet Take 1 mg by mouth daily.      . furosemide (LASIX) 20 MG tablet Take 1 tablet (20 mg total) by mouth daily as needed for fluid or edema (weight gain >3 lbs in 1 day or >5 lbs in 2 days). 30 tablet 0   . Insulin Degludec (TRESIBA FLEXTOUCH) 200 UNIT/ML SOPN Inject 65-100 Units into the skin daily.     Marland Kitchen ipratropium-albuterol (DUONEB) 0.5-2.5 (3) MG/3ML SOLN Take 3 mLs by nebulization 3 (three) times daily. 360 mL 0   . levothyroxine (SYNTHROID, LEVOTHROID) 88 MCG tablet Take 88 mcg by mouth daily before breakfast.     . linagliptin (TRADJENTA) 5 MG TABS tablet Take 1 tablet (5 mg total) by mouth daily. 30 tablet 0   . lisinopril (PRINIVIL,ZESTRIL) 2.5 MG tablet Take 2.5 mg by mouth daily.     Marland Kitchen oxyCODONE (OXY IR/ROXICODONE) 5 MG immediate release tablet Take 1 tablet (5 mg total) by mouth every 6 (six) hours as needed for severe pain. Must last 30 days 120 tablet 0   . polyethylene glycol (MIRALAX / GLYCOLAX) 17 g packet Take 17 g by mouth daily as needed for mild constipation. 14 each 0   . pramipexole (MIRAPEX) 0.125 MG tablet Take 0.125 mg by mouth 2 (  two) times daily.     . pregabalin (LYRICA) 150 MG capsule Take 1  capsule (150 mg total) by mouth 3 (three) times daily. 90 capsule 5   . QUEtiapine (SEROQUEL) 300 MG tablet TAKE ONE TABLET BY MOUTH AT BEDTIME. (Patient taking differently: Take 300 mg by mouth at bedtime. TAKE ONE TABLET BY MOUTH AT BEDTIME.) 28 tablet 11   . VESICARE 5 MG tablet Take 5 mg by mouth daily.     Marland Kitchen zinc sulfate 220 (50 Zn) MG capsule Take 1 capsule (220 mg total) by mouth daily. 30 capsule 0   . zolpidem (AMBIEN) 5 MG tablet Take 1 tablet (5 mg total) by mouth at bedtime. TAKE 1 TABLET BY MOUTH EVERYDAY AT BEDTIME 30 tablet 5     Assessment: CT was negative for PE. Heparin was restarted yesterday. Lovenox d/c'd, last dose was last night (3/21).  H/H trending down, PLTs trending down. Possibly attributable to hemodilution.    Date Time HL 3/22 1246 0.39   Goal of Therapy:  Heparin level 0.3-0.7 units/ml Monitor platelets by anticoagulation protocol: Yes   Plan:  Heparin level is therapeutic x 1 Continue 1600 units/hr Check HL in 6 hours  Monitor CBC  Ladona Ridgel A Manjit Bufano 07/09/2019,1:30 PM

## 2019-07-09 NOTE — Progress Notes (Signed)
ANTICOAGULATION CONSULT NOTE - Initial Consult  Pharmacy Consult for Heparin Indication: VTE prophylaxis / R/O PE  Allergies  Allergen Reactions  . Cephalexin Hives  . Codeine Palpitations, Nausea Only, Nausea And Vomiting, Rash and Shortness Of Breath    "makes heart fly, she gets flushed and passes out"  . Doxycycline Rash  . Propoxyphene Rash and Shortness Of Breath    Increase heart rate  . Sulfa Antibiotics Palpitations, Nausea Only, Shortness Of Breath and Hives    "makes heart fly, she gets flushed and passes out"  . Lovenox [Enoxaparin Sodium] Hives  . Hydrocodone Nausea And Vomiting    Hear racing & breaks out into a cold sweat.  . Meropenem Rash    Erythematous, hot, pruritic rash over arms, chest, back, abdomen, and face occurred at the end of meropenem infusion on 02/22/18    Patient Measurements: Height: 5\' 10"  (177.8 cm) Weight: (!) 350 lb (158.8 kg) IBW/kg (Calculated) : 68.5 HEPARIN DW (KG): 107.6  Vital Signs: Temp: 97.8 F (36.6 C) (03/22 1639) Temp Source: Oral (03/22 1639) BP: 102/61 (03/22 1845) Pulse Rate: 70 (03/22 1845)  Labs: Recent Labs    07/08/19 0014 07/08/19 0702 07/08/19 2131 07/09/19 0420 07/09/19 0500 07/09/19 1246 07/09/19 2037  HGB 11.8*  --   --  9.7*  --   --   --   HCT 38.4  --   --  31.5*  --   --   --   PLT 314  --   --  212  --   --   --   APTT  --  91*  --   --   --   --   --   LABPROT 15.6*  --   --   --   --   --   --   INR 1.3*  --   --   --   --   --   --   HEPARINUNFRC  --   --   --   --   --  0.39 0.40  CREATININE 0.90  --  1.39* 1.26*  --   --   --   TROPONINIHS  --  26*  --   --  24*  --   --     Estimated Creatinine Clearance: 81.3 mL/min (A) (by C-G formula based on SCr of 1.26 mg/dL (H)).   Medical History: Past Medical History:  Diagnosis Date  . Abdominal wall hernia 01/29/2013  . Anxiety   . Arthritis    Rheumatoid  . C. difficile colitis   . Chronic diastolic heart failure ([place])   . COVID-19  03/23/2019   Diagnosed at [facility] (send-out) on 03/23/2019  . Depression   . Diabetes mellitus    states no meds or diet restrictions  at present  . Diastolic CHF ([place])   . Esophagitis   . Fluid retention   . GERD (gastroesophageal reflux disease)   . Hiatal hernia   . Hypertension   . Hypokalemia due to loss of potassium 10/21/2015   Overview:  Associated with 3 weeks of diarrhea  And QT prolongation.  . Hypothyroidism   . IBS (irritable bowel syndrome)   . Moderate episode of recurrent major depressive disorder ([place]) 06/03/2004  . Morbid obesity ([place])   . MRSA (methicillin resistant Staphylococcus aureus) infection 11/2017   left inner thigh abcess  . Neurogenic bladder    has pacemaker  . Neuropathy   . Obesity   . Panic attacks   .  Pneumonia due to COVID-19 virus   . Rheumatoid arthritis (H[facility])   . Sleep apnea    STATES SEVERE, CANT TOLERATE MASK- LAST STUDY YEARS AGO    Medications:  Medications Prior to Admission  Medication Sig Dispense Refill Last Dose  . ALPRAZolam (XANAX) 0.5 MG tablet Take 1 tablet (0.5 mg total) by mouth 2 (two) times daily as needed for anxiety. 10 tablet 0   . ARIPiprazole (ABILIFY) 5 MG tablet TAKE 1 TABLET BY MOUTH ONCE DAILY. (Patient taking differently: Take 5 mg by mouth daily. ) 28 tablet 11   . ascorbic acid (VITAMIN C) 500 MG tablet Take 1 tablet (500 mg total) by mouth daily. 30 tablet 0   . atorvastatin (LIPITOR) 80 MG tablet Take 80 mg by mouth daily at 6 PM.      . benzonatate (TESSALON PERLES) 100 MG capsule Take 1 capsule (100 mg total) by mouth 3 (three) times daily as needed for cough. 30 capsule 0   . budesonide (PULMICORT) 0.5 MG/2ML nebulizer solution Take 2 mLs (0.5 mg total) by nebulization 2 (two) times daily. 120 mL 11   . busPIRone (BUSPAR) 10 MG tablet TAKE 1 TABLET BY MOUTH TWICE DAILY (Patient taking differently: Take 10 mg by mouth 2 (two) times daily. ) 56 tablet 11   . Calcium Carb-Ergocalciferol 250-125 MG-UNIT TABS Take 1  tablet by mouth daily.      . Cholecalciferol (VITAMIN D3) 125 MCG (5000 UT) CAPS Take 1 capsule (5,000 Units total) by mouth daily with breakfast. Take along with calcium and magnesium. 90 capsule 3   . ciclesonide (ALVESCO) 160 MCG/ACT inhaler Inhale 4 puffs into the lungs 2 (two) times daily. 1 Inhaler 0   . dexamethasone (DECADRON) 1 MG tablet Take 2 mg daily for 3days,Take 1mg  daily for 3 days, Take 1mg  every other day for 3days,then stop 12 tablet 0   . Ensure Max Protein (ENSURE MAX PROTEIN) LIQD Take 330 mLs (11 oz total) by mouth 2 (two) times daily between meals. 10000 mL 0   . famotidine (PEPCID) 20 MG tablet Take 1 tablet (20 mg total) by mouth daily. 30 tablet 0   . FLUoxetine (PROZAC) 20 MG capsule Take 1 capsule (20 mg total) by mouth daily. TAKE (1) CAPSULE BY MOUTH ONCE DAILY. 30 capsule 6   . folic acid (FOLVITE) 1 MG tablet Take 1 mg by mouth daily.      . furosemide (LASIX) 20 MG tablet Take 1 tablet (20 mg total) by mouth daily as needed for fluid or edema (weight gain >3 lbs in 1 day or >5 lbs in 2 days). 30 tablet 0   . Insulin Degludec (TRESIBA FLEXTOUCH) 200 UNIT/ML SOPN Inject 65-100 Units into the skin daily.     ipratropium-albuterol (DUONEB) 0.5-2.5 (3) MG/3ML SOLN Take 3 mLs by nebulization 3 (three) times daily. 360 mL 0   . levothyroxine (SYNTHROID, LEVOTHROID) 88 MCG tablet Take 88 mcg by mouth daily before breakfast.     . linagliptin (TRADJENTA) 5 MG TABS tablet Take 1 tablet (5 mg total) by mouth daily. 30 tablet 0   . lisinopril (PRINIVIL,ZESTRIL) 2.5 MG tablet Take 2.5 mg by mouth daily.     oxyCODONE (OXY IR/ROXICODONE) 5 MG immediate release tablet Take 1 tablet (5 mg total) by mouth every 6 (six) hours as needed for severe pain. Must last 30 days 120 tablet 0   . polyethylene glycol (MIRALAX / GLYCOLAX) 17 g packet Take 17 g by  mouth daily as needed for mild constipation. 14 each 0   . pramipexole (MIRAPEX) 0.125 MG tablet Take 0.125 mg by mouth 2 (two)  times daily.     . pregabalin (LYRICA) 150 MG capsule Take 1 capsule (150 mg total) by mouth 3 (three) times daily. 90 capsule 5   . QUEtiapine (SEROQUEL) 300 MG tablet TAKE ONE TABLET BY MOUTH AT BEDTIME. (Patient taking differently: Take 300 mg by mouth at bedtime. TAKE ONE TABLET BY MOUTH AT BEDTIME.) 28 tablet 11   . VESICARE 5 MG tablet Take 5 mg by mouth daily.     Marland Kitchen zinc sulfate 220 (50 Zn) MG capsule Take 1 capsule (220 mg total) by mouth daily. 30 capsule 0   . zolpidem (AMBIEN) 5 MG tablet Take 1 tablet (5 mg total) by mouth at bedtime. TAKE 1 TABLET BY MOUTH EVERYDAY AT BEDTIME 30 tablet 5     Assessment: CT was negative for PE. Heparin was restarted yesterday. Lovenox d/c'd, last dose was last night (3/21).  H/H trending down, PLTs trending down. Possibly attributable to hemodilution.    Date Time HL 3/22 1246 0.39  3/22     2037    0.40  Goal of Therapy:  Heparin level 0.3-0.7 units/ml Monitor platelets by anticoagulation protocol: Yes   Plan:   Continue 1600 units/hr Will recheck HL on 3/23 with AM labs.   Monitor CBC  Amyria Komar D 07/09/2019,10:35 PM

## 2019-07-09 NOTE — Progress Notes (Signed)
ANTICOAGULATION CONSULT NOTE - Initial Consult  Pharmacy Consult for Heparin Indication: VTE prophylaxis / R/O PE  Allergies  Allergen Reactions  . Cephalexin Hives  . Codeine Palpitations, Nausea Only, Nausea And Vomiting, Rash and Shortness Of Breath    "makes heart fly, she gets flushed and passes out"  . Doxycycline Rash  . Propoxyphene Rash and Shortness Of Breath    Increase heart rate  . Sulfa Antibiotics Palpitations, Nausea Only, Shortness Of Breath and Hives    "makes heart fly, she gets flushed and passes out"  . Lovenox [Enoxaparin Sodium] Hives  . Hydrocodone Nausea And Vomiting    Hear racing & breaks out into a cold sweat.  . Meropenem Rash    Erythematous, hot, pruritic rash over arms, chest, back, abdomen, and face occurred at the end of meropenem infusion on 02/22/18    Patient Measurements: Height: 5\' 10"  (177.8 cm) Weight: (!) 350 lb (158.8 kg) IBW/kg (Calculated) : 68.5 HEPARIN DW (KG): 107.6  Vital Signs: Temp: 98.6 F (37 C) (03/22 0000) Temp Source: Oral (03/22 0000)  Labs: Recent Labs    07/08/19 0014 07/08/19 0702 07/08/19 2131 07/09/19 0420  HGB 11.8*  --   --  9.7*  HCT 38.4  --   --  31.5*  PLT 314  --   --  212  APTT  --  91*  --   --   LABPROT 15.6*  --   --   --   INR 1.3*  --   --   --   CREATININE 0.90  --  1.39* 1.26*  TROPONINIHS  --  26*  --   --     Estimated Creatinine Clearance: 81.3 mL/min (A) (by C-G formula based on SCr of 1.26 mg/dL (H)).   Medical History: Past Medical History:  Diagnosis Date  . Abdominal wall hernia 01/29/2013  . Anxiety   . Arthritis    Rheumatoid  . C. difficile colitis   . Chronic diastolic heart failure (HCC)   . COVID-19 03/23/2019   Diagnosed at [facility] (send-out) on 03/23/2019  . Depression   . Diabetes mellitus    states no meds or diet restrictions  at present  . Diastolic CHF (H[facility])   . Esophagitis   . Fluid retention   . GERD (gastroesophageal reflux disease)   . Hiatal hernia    . Hypertension   . Hypokalemia due to loss of potassium 10/21/2015   Overview:  Associated with 3 weeks of diarrhea  And QT prolongation.  . Hypothyroidism   . IBS (irritable bowel syndrome)   . Moderate episode of recurrent major depressive disorder (HCC) 06/03/2004  . Morbid obesity (HCC)   . MRSA (methicillin resistant Staphylococcus aureus) infection 11/2017   left inner thigh abcess  . Neurogenic bladder    has pacemaker  . Neuropathy   . Obesity   . Panic attacks   . Pneumonia due to COVID-19 virus   . Rheumatoid arthritis (HCC)   . Sleep apnea    STATES SEVERE, CANT TOLERATE MASK- LAST STUDY YEARS AGO    Medications:  Medications Prior to Admission  Medication Sig Dispense Refill Last Dose  . ALPRAZolam (XANAX) 0.5 MG tablet Take 1 tablet (0.5 mg total) by mouth 2 (two) times daily as needed for anxiety. 10 tablet 0   . ARIPiprazole (ABILIFY) 5 MG tablet TAKE 1 TABLET BY MOUTH ONCE DAILY. (Patient taking differently: Take 5 mg by mouth daily. ) 28 tablet 11   .  ascorbic acid (VITAMIN C) 500 MG tablet Take 1 tablet (500 mg total) by mouth daily. 30 tablet 0   . atorvastatin (LIPITOR) 80 MG tablet Take 80 mg by mouth daily at 6 PM.      . benzonatate (TESSALON PERLES) 100 MG capsule Take 1 capsule (100 mg total) by mouth 3 (three) times daily as needed for cough. 30 capsule 0   . budesonide (PULMICORT) 0.5 MG/2ML nebulizer solution Take 2 mLs (0.5 mg total) by nebulization 2 (two) times daily. 120 mL 11   . busPIRone (BUSPAR) 10 MG tablet TAKE 1 TABLET BY MOUTH TWICE DAILY (Patient taking differently: Take 10 mg by mouth 2 (two) times daily. ) 56 tablet 11   . Calcium Carb-Ergocalciferol 250-125 MG-UNIT TABS Take 1 tablet by mouth daily.      . Cholecalciferol (VITAMIN D3) 125 MCG (5000 UT) CAPS Take 1 capsule (5,000 Units total) by mouth daily with breakfast. Take along with calcium and magnesium. 90 capsule 3   . ciclesonide (ALVESCO) 160 MCG/ACT inhaler Inhale 4 puffs into the  lungs 2 (two) times daily. 1 Inhaler 0   . dexamethasone (DECADRON) 1 MG tablet Take 2 mg daily for 3days,Take 1mg  daily for 3 days, Take 1mg  every other day for 3days,then stop 12 tablet 0   . Ensure Max Protein (ENSURE MAX PROTEIN) LIQD Take 330 mLs (11 oz total) by mouth 2 (two) times daily between meals. 10000 mL 0   . famotidine (PEPCID) 20 MG tablet Take 1 tablet (20 mg total) by mouth daily. 30 tablet 0   . FLUoxetine (PROZAC) 20 MG capsule Take 1 capsule (20 mg total) by mouth daily. TAKE (1) CAPSULE BY MOUTH ONCE DAILY. 30 capsule 6   . folic acid (FOLVITE) 1 MG tablet Take 1 mg by mouth daily.      . furosemide (LASIX) 20 MG tablet Take 1 tablet (20 mg total) by mouth daily as needed for fluid or edema (weight gain >3 lbs in 1 day or >5 lbs in 2 days). 30 tablet 0   . Insulin Degludec (TRESIBA FLEXTOUCH) 200 UNIT/ML SOPN Inject 65-100 Units into the skin daily.     Marland Kitchen ipratropium-albuterol (DUONEB) 0.5-2.5 (3) MG/3ML SOLN Take 3 mLs by nebulization 3 (three) times daily. 360 mL 0   . levothyroxine (SYNTHROID, LEVOTHROID) 88 MCG tablet Take 88 mcg by mouth daily before breakfast.     . linagliptin (TRADJENTA) 5 MG TABS tablet Take 1 tablet (5 mg total) by mouth daily. 30 tablet 0   . lisinopril (PRINIVIL,ZESTRIL) 2.5 MG tablet Take 2.5 mg by mouth daily.     Marland Kitchen oxyCODONE (OXY IR/ROXICODONE) 5 MG immediate release tablet Take 1 tablet (5 mg total) by mouth every 6 (six) hours as needed for severe pain. Must last 30 days 120 tablet 0   . polyethylene glycol (MIRALAX / GLYCOLAX) 17 g packet Take 17 g by mouth daily as needed for mild constipation. 14 each 0   . pramipexole (MIRAPEX) 0.125 MG tablet Take 0.125 mg by mouth 2 (two) times daily.     . pregabalin (LYRICA) 150 MG capsule Take 1 capsule (150 mg total) by mouth 3 (three) times daily. 90 capsule 5   . QUEtiapine (SEROQUEL) 300 MG tablet TAKE ONE TABLET BY MOUTH AT BEDTIME. (Patient taking differently: Take 300 mg by mouth at bedtime. TAKE  ONE TABLET BY MOUTH AT BEDTIME.) 28 tablet 11   . VESICARE 5 MG tablet Take 5 mg by mouth daily.     Marland Kitchen  zinc sulfate 220 (50 Zn) MG capsule Take 1 capsule (220 mg total) by mouth daily. 30 capsule 0   . zolpidem (AMBIEN) 5 MG tablet Take 1 tablet (5 mg total) by mouth at bedtime. TAKE 1 TABLET BY MOUTH EVERYDAY AT BEDTIME 30 tablet 5     Assessment: D/W Annabelle Harman.  Plan to r/o PE.  Restart Heparin.  Lovenox d/c'd, last dose was last night.  H/H trending down, PLTs OK.  Goal of Therapy:  Heparin level 0.3-0.7 units/ml Monitor platelets by anticoagulation protocol: Yes   Plan:  Heparin 4000 units bolus x 1 then infusion at 1600 units/hr Check HL ~ 6 hours after heparin started Monitor CBC  Valrie Hart A 07/09/2019,6:03 AM

## 2019-07-09 NOTE — Progress Notes (Signed)
Pharmacy Antibiotic Note  [name] [name] is a 59 y.o. female admitted on 07/07/2019 with sepsis.  Pharmacy has been consulted for Vancomycin dosing. Patient now with MRSA bacteremia. Patient has history of neurogenic bladder with implanted bladder stimulator. Ciprofloxacin d/c per CCM. WBC trending up. Tmax 24 hours 101.86F. TTE without evidence of endocarditis. No repeat Bcx ordered yet.   Plan: Vancomycin 1250 mg IV Q 12 hrs. Goal AUC 400-550. Expected AUC: 437.7, Css 12.8 SCr used: 1.26 (CrCl calculated based on adjBW) Vd: 0.5 (Obese BMI: 50.22)    Daily Scr per protocol. Renal function fluctuating (0.9>1.39>1.26). Anticipate levels tomorrow.  Height: 5\' 10"  (177.8 cm) Weight: (!) 350 lb (158.8 kg) IBW/kg (Calculated) : 68.5  Temp (24hrs), Avg:99.8 F (37.7 C), Min:98.4 F (36.9 C), Max:101.8 F (38.8 C)  Recent Labs  Lab 07/08/19 0014 07/08/19 0257 07/08/19 0702 07/08/19 2131 07/09/19 0420  WBC 17.7*  --   --   --  21.7*  CREATININE 0.90  --   --  1.39* 1.26*  LATICACIDVEN 4.3* 5.1* 4.1*  --   --     Estimated Creatinine Clearance: 81.3 mL/min (A) (by C-G formula based on SCr of 1.26 mg/dL (H)).    Allergies  Allergen Reactions  . Cephalexin Hives  . Codeine Palpitations, Nausea Only, Nausea And Vomiting, Rash and Shortness Of Breath    "makes heart fly, she gets flushed and passes out"  . Doxycycline Rash  . Propoxyphene Rash and Shortness Of Breath    Increase heart rate  . Sulfa Antibiotics Palpitations, Nausea Only, Shortness Of Breath and Hives    "makes heart fly, she gets flushed and passes out"  . Lovenox [Enoxaparin Sodium] Hives  . Hydrocodone Nausea And Vomiting    Hear racing & breaks out into a cold sweat.  . Meropenem Rash    Erythematous, hot, pruritic rash over arms, chest, back, abdomen, and face occurred at the end of meropenem infusion on 02/22/18    Antimicrobials this admission: Aztreonam 3/21 >> 3/21 Ciprofloxacin 3/21>> 3/22 Vancomycin  3/21>>   Dose adjustments this admission:   Microbiology results: 3/21 BCx: 4/4 MRSA growth 3/20 UCx: no growth Sputum:   3/21 MRSA PCR: Positive   Thank you for allowing pharmacy to be a part of this patient's care.  4/21 (Student Pharmacist) 07/09/2019 11:02 AM

## 2019-07-09 NOTE — Consult Note (Signed)
Infectious Disease     Reason for Consult: MRSA bacteremia   Referring Physician: Dr Mortimer Fries Date of Admission:  07/07/2019   Principal Problem:   Septic shock ([place]) Active Problems:   COPD (chronic obstructive pulmonary disease) ([place])   Essential (primary) hypertension   Chronic diastolic CHF (congestive heart failure) (HCC)   Rheumatoid arthritis (HCC)   Chronic pain syndrome   Hypothyroidism   Acute metabolic encephalopathy   Hyperglycemia due to type 2 diabetes mellitus ([place])   Morbid obesity with BMI of 50.0-59.9, adult ([place])   History of COVID-19   Severe sepsis with septic shock (HCC)   HPI: [name] [name] is a 59 y.o. female with a hx of HTN, DM 2 (B2W 41.3) with complications, hypothyroidism, depression anxiety, rheumatoid arthritis, OSA, diastolic heart failure, abdominal wall hernia, morbid obesity, who was hospitalized in December 2020 for ARDS secondary to COVID-19 pneumonia now admitted 3/20 with body aches, SOB. On admit febrile, wbc 17, LA 4.8. CT chest abd pelvis with perinephric stranding ,possible splenic infarct and GGO llungs. She has been in the unit on pressors. [place] growing MRSA by [employer]. On vanco (also received cipro and aztreonam and flagyl). WBC up to 21. Temp up to 105 but now down to 99. UA unimpressive.  Per family has hx priro MRSA bacteremia in 2011 assocaited with reimplantation of a bladder stimulator on L thigh. 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 Micro - ucx neg.  Alpha MRSA Imaging reviewed as above Echo neg   Past Medical History:  Diagnosis Date  . Abdominal wall hernia 01/29/2013  . Anxiety   . Arthritis    Rheumatoid  . C. difficile colitis   . Chronic diastolic heart failure ([place])   . COVID-19 03/23/2019   Diagnosed at [facility] (send-out) on 03/23/2019  . Depression   . Diabetes mellitus    states no meds or diet restrictions  at present  . Diastolic CHF ([place])   . Esophagitis   . Fluid retention    . GERD (gastroesophageal reflux disease)   . Hiatal hernia   . Hypertension   . Hypokalemia due to loss of potassium 10/21/2015   Overview:  Associated with 3 weeks of diarrhea  And QT prolongation.  . Hypothyroidism   . IBS (irritable bowel syndrome)   . Moderate episode of recurrent major depressive disorder ([place]) 06/03/2004  . Morbid obesity ([place])   . MRSA (methicillin resistant Staphylococcus aureus) infection 11/2017   left inner thigh abcess  . Neurogenic bladder    has pacemaker  . Neuropathy   . Obesity   . Panic attacks   . Pneumonia due to COVID-19 virus   . Rheumatoid arthritis ([place])   . Sleep apnea    STATES SEVERE, CANT TOLERATE MASK- LAST STUDY YEARS AGO   Past Surgical History:  Procedure Laterality Date  . ABDOMINAL HYSTERECTOMY    . CHOLECYSTECTOMY    . DG GREAT TOE RIGHT FOOT  02/23/2018  . EYE SURGERY     bilateral cataract extraction with IOL  . HERNIA REPAIR     ventral hernia with strangulation  . LAPAROSCOPIC GASTRIC BANDING  03/20/07  . TONSILLECTOMY    . TUBAL LIGATION     Social History   Tobacco Use  . Smoking status: Former Smoker    Packs/day: 2.00    Years: 27.00    Pack years: 54.00    Types: Cigarettes    Quit date:  07/30/1999    Years since quitting: 19.9  . Smokeless tobacco: Never Used  Substance Use Topics  . Alcohol use: No  . Drug use: No   Family History  Problem Relation Age of Onset  . Heart failure Father   . Bipolar disorder Father   . Alcohol abuse Father   . Anxiety disorder Father   . Depression Father   . Heart disease Brother   . Heart attack Brother 8       MI s/p stents placed  . Anxiety disorder Sister   . Depression Sister   . Anxiety disorder Sister   . Depression Sister   . Bipolar disorder Sister   . Alcohol abuse Sister   . Drug abuse Sister   . Heart attack Brother     Allergies:  Allergies  Allergen Reactions  . Cephalexin Hives  . Codeine Palpitations, Nausea Only, Nausea And Vomiting,  Rash and Shortness Of Breath    "makes heart fly, she gets flushed and passes out"  . Doxycycline Rash  . Propoxyphene Rash and Shortness Of Breath    Increase heart rate  . Sulfa Antibiotics Palpitations, Nausea Only, Shortness Of Breath and Hives    "makes heart fly, she gets flushed and passes out"  . Lovenox [Enoxaparin Sodium] Hives  . Hydrocodone Nausea And Vomiting    Hear racing & breaks out into a cold sweat.  . Meropenem Rash    Erythematous, hot, pruritic rash over arms, chest, back, abdomen, and face occurred at the end of meropenem infusion on 02/22/18    Current antibiotics: Antibiotics Given (last 72 hours)    Date/Time Action Medication Dose Rate   07/08/19 0035 New Bag/Given   aztreonam (AZACTAM) 2 g in sodium chloride 0.9 % 100 mL IVPB 2 g 200 mL/hr   07/08/19 0137 New Bag/Given   metroNIDAZOLE (FLAGYL) IVPB 500 mg 500 mg 100 mL/hr   07/08/19 0257 New Bag/Given   vancomycin (VANCOCIN) IVPB 1000 mg/200 mL premix 1,000 mg 200 mL/hr   07/08/19 0530 New Bag/Given   vancomycin (VANCOCIN) IVPB 1000 mg/200 mL premix 1,000 mg 200 mL/hr   07/08/19 0741 New Bag/Given   aztreonam (AZACTAM) 2 g in sodium chloride 0.9 % 100 mL IVPB 2 g 200 mL/hr   07/08/19 1258 New Bag/Given   ciprofloxacin (CIPRO) IVPB 400 mg 400 mg 200 mL/hr   07/08/19 1659 New Bag/Given   vancomycin (VANCOREADY) IVPB 1250 mg/250 mL 1,250 mg 166.7 mL/hr   07/09/19 0017 New Bag/Given   ciprofloxacin (CIPRO) IVPB 400 mg 400 mg 200 mL/hr   07/09/19 0516 New Bag/Given   vancomycin (VANCOREADY) IVPB 1250 mg/250 mL 1,250 mg 166.7 mL/hr      MEDICATIONS: . busPIRone  10 mg Oral BID  . Chlorhexidine Gluconate Cloth  6 each Topical Daily  . hydrocortisone sod succinate (SOLU-CORTEF) inj  50 mg Intravenous Q6H  . insulin aspart  0-20 Units Subcutaneous TID WC  . insulin aspart  0-5 Units Subcutaneous QHS  . insulin detemir  20 Units Subcutaneous BID  . levothyroxine  88 mcg Oral QAC breakfast  . pramipexole   0.125 mg Oral BID    Review of Systems - unable to obtain  OBJECTIVE: Temp:  [98.4 F (36.9 C)-101.8 F (38.8 C)] 98.4 F (36.9 C) (03/22 0800) Pulse Rate:  [61-111] 76 (03/22 1200) Resp:  [17-30] 20 (03/22 1200) BP: (85-133)/(39-67) 109/54 (03/22 1200) SpO2:  [93 %-100 %] 97 % (03/22 1200) Physical Exam  Constitutional:  Morbidly  obese, sedated. On O2 HENT: [place]/AT, PERRLA, no scleral icterus Mouth/Throat: Oropharynx is clear and moist. No oropharyngeal exudate.  Cardiovascular: Normal rate, regular rhythm and normal heart sounds.  Pulmonary/Chest: Effort normal and breath sounds normal. No respiratory distress.  has no wheezes.  Neck = supple, no nuchal rigidity Abdominal: massive pannusSoft. Bowel sounds are normal.  exhibits no distension. There is no tenderness.  Lymphadenopathy: no cervical adenopathy. No axillary adenopathy Neurological: on sedation  Skin: Skin is warm and dry. No rash noted. No erythema. 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   Psychiatric: sedated Line L neck TLC wnl  LABS: Results for orders placed or performed during the hospital encounter of 07/07/19 (from the past 48 hour(s))  C-reactive protein     Status: Abnormal   Collection Time: 07/07/19 11:48 PM  Result Value Ref Range   CRP 2.6 (H) <1.0 mg/dL    Comment: Performed at [facility], [address] [place], [place] [address]  Blood gas, venous     Status: Abnormal (Preliminary result)   Collection Time: 07/08/19 12:05 AM  Result Value Ref Range   pH, Ven 7.41 7.250 - 7.430   pCO2, Ven 42 (L) 44.0 - 60.0 mmHg   pO2, Ven PENDING 32.0 - 45.0 mmHg   Bicarbonate 26.6 20.0 - 28.0 mmol/L   Acid-Base Excess 1.7 0.0 - 2.0 mmol/L   O2 Saturation 46.1 %   Patient temperature 37.0    Collection site LINE    Sample type VENOUS     Comment: Performed at [facility], [address] [place], [place] [address]  Lactic acid, plasma     Status: Abnormal   Collection Time: 07/08/19  12:14 AM  Result Value Ref Range   Lactic Acid, Venous 4.3 (HH) 0.5 - 1.9 mmol/L    Comment: CRITICAL RESULT CALLED TO, READ BACK BY AND VERIFIED WITH REBECCA BAXTER @158  07/08/2019 TTG Performed at [facility], [address] [place], [place] [address]   Comprehensive metabolic panel     Status: Abnormal   Collection Time: 07/08/19 12:14 AM  Result Value Ref Range   Sodium 134 (L) 135 - 145 mmol/L   Potassium 3.7 3.5 - 5.1 mmol/L   Chloride 97 (L) 98 - 111 mmol/L   CO2 26 22 - 32 mmol/L   Glucose, Bld 413 (H) 70 - 99 mg/dL    Comment: Glucose reference range applies only to samples taken after fasting for at least 8 hours.   BUN 17 6 - 20 mg/dL   Creatinine, Ser 0.90 0.44 - 1.00 mg/dL   Calcium 9.1 8.9 - 10.3 mg/dL   Total Protein 7.7 6.5 - 8.1 g/dL   Albumin 3.4 (L) 3.5 - 5.0 g/dL   AST 17 15 - 41 U/L   ALT 12 0 - 44 U/L   Alkaline Phosphatase 87 38 - 126 U/L   Total Bilirubin 0.9 0.3 - 1.2 mg/dL   GFR calc non Af Amer >60 >60 mL/min   GFR calc Af Amer >60 >60 mL/min   Anion gap 11 5 - 15    Comment: Performed at [facility], [address] [place], [place] [address]  Lipase, blood     Status: None   Collection Time: 07/08/19 12:14 AM  Result Value Ref Range   Lipase 36 11 - 51 U/L    Comment: Performed at [facility], [address], [place],  [address]  Brain natriuretic peptide - IF  patient is dyspneic     Status: None   Collection Time: 07/08/19 12:14 AM  Result Value Ref Range   B Natriuretic Peptide 47.0 0.0 - 100.0 pg/mL    Comment: Performed at [facility], [address] [place], [place] [address]  CBC WITH DIFFERENTIAL     Status: Abnormal   Collection Time: 07/08/19 12:14 AM  Result Value Ref Range   WBC 17.7 (H) 4.0 - 10.5 K/uL   RBC 4.86 3.87 - 5.11 MIL/uL   Hemoglobin 11.8 (L) 12.0 - 15.0 g/dL   HCT 38.4 36.0 - 46.0 %   MCV 79.0 (L) 80.0 - 100.0 fL   MCH 24.3 (L) 26.0 - 34.0 pg   MCHC 30.7 30.0 - 36.0  g/dL   RDW 16.0 (H) 11.5 - 15.5 %   Platelets 314 150 - 400 K/uL   nRBC 0.0 0.0 - 0.2 %   Neutrophils Relative % 90 %   Neutro Abs 15.9 (H) 1.7 - 7.7 K/uL   Lymphocytes Relative 7 %   Lymphs Abs 1.2 0.7 - 4.0 K/uL   Monocytes Relative 2 %   Monocytes Absolute 0.4 0.1 - 1.0 K/uL   Eosinophils Relative 0 %   Eosinophils Absolute 0.0 0.0 - 0.5 K/uL   Basophils Relative 0 %   Basophils Absolute 0.1 0.0 - 0.1 K/uL   Immature Granulocytes 1 %   Abs Immature Granulocytes 0.18 (H) 0.00 - 0.07 K/uL    Comment: Performed at [facility], [address] [place], [place] [address]  Procalcitonin     Status: None   Collection Time: 07/08/19 12:14 AM  Result Value Ref Range   Procalcitonin 0.45 ng/mL    Comment:        Interpretation: PCT (Procalcitonin) <= 0.5 ng/mL: Systemic infection (sepsis) is not likely. Local bacterial infection is possible. (NOTE)       Sepsis PCT Algorithm           Lower Respiratory Tract                                      Infection PCT Algorithm    ----------------------------     ----------------------------         PCT < 0.25 ng/mL                PCT < 0.10 ng/mL         Strongly encourage             Strongly discourage   discontinuation of antibiotics    initiation of antibiotics    ----------------------------     -----------------------------       PCT 0.25 - 0.50 ng/mL            PCT 0.10 - 0.25 ng/mL               OR       >80% decrease in PCT            Discourage initiation of                                            antibiotics      Encourage discontinuation           of antibiotics    ----------------------------     -----------------------------  PCT >= 0.50 ng/mL              PCT 0.26 - 0.50 ng/mL               AND        <80% decrease in PCT             Encourage initiation of                                             antibiotics       Encourage continuation           of antibiotics    ----------------------------      -----------------------------        PCT >= 0.50 ng/mL                  PCT > 0.50 ng/mL               AND         increase in PCT                  Strongly encourage                                      initiation of antibiotics    Strongly encourage escalation           of antibiotics                                     -----------------------------                                           PCT <= 0.25 ng/mL                                                 OR                                        > 80% decrease in PCT                                     Discontinue / Do not initiate                                             antibiotics Performed at [facility], [address] [place], [place] [address]   Protime-INR     Status: Abnormal   Collection Time: 07/08/19 12:14 AM  Result Value Ref Range   Prothrombin Time 15.6 (H) 11.4 - 15.2 seconds   INR 1.3 (H) 0.8 - 1.2    Comment: (NOTE) INR goal varies based on device and disease states.  Performed at [facility], [address] [place], [place] [address]   Blood Culture (routine x 2)     Status: Abnormal (Preliminary result)   Collection Time: 07/08/19 12:14 AM   Specimen: BLOOD  Result Value Ref Range   Specimen Description      BLOOD LEFT ANTECUBITAL Performed at [facility], [address], [place], [place] [address]    Special Requests      BOTTLES DRAWN AEROBIC AND ANAEROBIC Blood Culture adequate volume Performed at [facility], [place], [place] [address]    Culture  Setup Time      GRAM POSITIVE COCCI IN BOTH AEROBIC AND ANAEROBIC BOTTLES CRITICAL RESULT CALLED TO, READ BACK BY AND VERIFIED WITH: ABBY ELLINGTON AT 0258 ON 07/08/19 SNG Performed at [facility], [place] [address], [place], [place] [address]    Culture STAPHYLOCOCCUS AUREUS (A)    Report Status PENDING   Blood Culture (routine x 2)     Status: Abnormal (Preliminary result)    Collection Time: 07/08/19 12:14 AM   Specimen: BLOOD  Result Value Ref Range   Specimen Description      BLOOD RIGHT ANTECUBITAL Performed at [facility], [address] [place], [place] [address]    Special Requests      BOTTLES DRAWN AEROBIC AND ANAEROBIC Blood Culture results may not be optimal due to an excessive volume of blood received in culture bottles Performed at [facility], [address], [place], [place] [address]    Culture  Setup Time      GRAM POSITIVE COCCI IN BOTH AEROBIC AND ANAEROBIC BOTTLES CRITICAL VALUE NOTED.  VALUE IS CONSISTENT WITH PREVIOUSLY REPORTED AND CALLED VALUE. Performed at [facility], [address] [place], [place] [address]    Culture STAPHYLOCOCCUS AUREUS (A)    Report Status PENDING   Urinalysis, Complete w Microscopic     Status: Abnormal   Collection Time: 07/08/19 12:14 AM  Result Value Ref Range   Color, Urine YELLOW (A) YELLOW   APPearance CLEAR (A) CLEAR   Specific Gravity, Urine 1.014 1.005 - 1.030   pH 5.0 5.0 - 8.0   Glucose, UA >=500 (A) NEGATIVE mg/dL   Hgb urine dipstick NEGATIVE NEGATIVE   Bilirubin Urine NEGATIVE NEGATIVE   Ketones, ur NEGATIVE NEGATIVE mg/dL   Protein, ur NEGATIVE NEGATIVE mg/dL   Nitrite NEGATIVE NEGATIVE   Leukocytes,Ua TRACE (A) NEGATIVE   RBC / HPF 0-5 0 - 5 RBC/hpf   WBC, UA 0-5 0 - 5 WBC/hpf   Bacteria, UA NONE SEEN NONE SEEN   Squamous Epithelial / LPF 0-5 0 - 5    Comment: Performed at [facility], [address] [place], [place] [address]  Fibrin derivatives D-Dimer     Status: Abnormal   Collection Time: 07/08/19 12:14 AM  Result Value Ref Range   Fibrin derivatives D-dimer ([facility]) 1,424.00 (H) 0.00 - 499.00 ng/mL (FEU)    Comment: (NOTE) <> Exclusion of Venous Thromboembolism (VTE) - OUTPATIENT ONLY   (Emergency Department or [place])   0-499 ng/ml (FEU): With a low to intermediate pretest probability                      for VTE this test  result excludes the diagnosis                      of VTE.   >499 ng/ml (FEU) :  VTE not excluded; additional work up for VTE is                      required. <> Testing on Inpatients and Evaluation of Disseminated Intravascular   Coagulation (DIC) Reference Range:   0-499 ng/ml (FEU) Performed at [facility], [address] [place], [place] [address]   Lactate dehydrogenase     Status: None   Collection Time: 07/08/19 12:14 AM  Result Value Ref Range   LDH 168 98 - 192 U/L    Comment: Performed at [facility], [address] [place], [place] [address]  Ferritin     Status: None   Collection Time: 07/08/19 12:14 AM  Result Value Ref Range   Ferritin 42 11 - 307 ng/mL    Comment: Performed at [facility], [address] [place], [place] [address]  Fibrinogen     Status: Abnormal   Collection Time: 07/08/19 12:14 AM  Result Value Ref Range   Fibrinogen 575 (H) 210 - 475 mg/dL    Comment: Performed at [facility], [address] [place], [place] [address]  Blood Culture ID Panel (Reflexed)     Status: Abnormal   Collection Time: 07/08/19 12:14 AM  Result Value Ref Range   Enterococcus species NOT DETECTED NOT DETECTED   Listeria monocytogenes NOT DETECTED NOT DETECTED   Staphylococcus species DETECTED (A) NOT DETECTED    Comment: CRITICAL RESULT CALLED TO, READ BACK BY AND VERIFIED WITH: CHARLES SHANLEVER AT 4128 ON 07/08/19 SNG    Staphylococcus aureus (BCID) DETECTED (A) NOT DETECTED    Comment: Methicillin (oxacillin)-resistant Staphylococcus aureus (MRSA). MRSA is predictably resistant to beta-lactam antibiotics (except ceftaroline). Preferred therapy is vancomycin unless clinically contraindicated. Patient requires contact precautions if  hospitalized. CRITICAL RESULT CALLED TO, READ BACK BY AND VERIFIED WITH: CHARLES SHANLEVER AT 7867 ON 07/08/19 SNG    Methicillin resistance DETECTED (A) NOT DETECTED    Comment: CRITICAL RESULT  CALLED TO, READ BACK BY AND VERIFIED WITH: CHARLES SHANLEVER AT 6720 ON 07/08/19 SNG    Streptococcus species NOT DETECTED NOT DETECTED   Streptococcus agalactiae NOT DETECTED NOT DETECTED   Streptococcus pneumoniae NOT DETECTED NOT DETECTED   Streptococcus pyogenes NOT DETECTED NOT DETECTED   Acinetobacter baumannii NOT DETECTED NOT DETECTED   Enterobacteriaceae species NOT DETECTED NOT DETECTED   Enterobacter cloacae complex NOT DETECTED NOT DETECTED   Escherichia coli NOT DETECTED NOT DETECTED   Klebsiella oxytoca NOT DETECTED NOT DETECTED   Klebsiella pneumoniae NOT DETECTED NOT DETECTED   Proteus species NOT DETECTED NOT DETECTED   Serratia marcescens NOT DETECTED NOT DETECTED   Haemophilus influenzae NOT DETECTED NOT DETECTED   Neisseria meningitidis NOT DETECTED NOT DETECTED   Pseudomonas aeruginosa NOT DETECTED NOT DETECTED   Candida albicans NOT DETECTED NOT DETECTED   Candida glabrata NOT DETECTED NOT DETECTED   Candida krusei NOT DETECTED NOT DETECTED   Candida parapsilosis NOT DETECTED NOT DETECTED   Candida tropicalis NOT DETECTED NOT DETECTED    Comment: Performed at [facility], [address] [place], [place] [address]  POC SARS Coronavirus 2 Ag     Status: None   Collection Time: 07/08/19  1:14 AM  Result Value Ref Range   SARS Coronavirus 2 Ag NEGATIVE NEGATIVE    Comment: (NOTE) SARS-CoV-2 antigen NOT DETECTED.  Negative results are presumptive.  Negative results do not preclude SARS-CoV-2 infection and should not be used as the sole basis for  treatment or other patient management decisions, including infection  control decisions, particularly in the presence of clinical signs and  symptoms consistent with COVID-19, or in those who have been in contact with the virus.  Negative results must be combined with clinical observations, patient history, and epidemiological information. The expected result is Negative. Fact Sheet for Patients:  PodPark.tn Fact Sheet for Healthcare Providers: GiftContent.is This test is not yet approved or cleared by the [place] FDA and  has been authorized for detection and/or diagnosis of SARS-CoV-2 by FDA under an Emergency Use Authorization (EUA).  This EUA will remain in effect (meaning this test can be used) for the duration of  the COVID-19 de claration under Section 564(b)(1) of the Act, 21 U.S.C. section 360bbb-3(b)(1), unless the authorization is terminated or revoked sooner.   Respiratory Panel by RT PCR (Flu A&B, Covid) - Nasopharyngeal Swab     Status: None   Collection Time: 07/08/19  1:18 AM   Specimen: Nasopharyngeal Swab  Result Value Ref Range   SARS Coronavirus 2 by RT PCR NEGATIVE NEGATIVE    Comment: (NOTE) SARS-CoV-2 target nucleic acids are NOT DETECTED. The SARS-CoV-2 RNA is generally detectable in upper respiratoy specimens during the acute phase of infection. The lowest concentration of SARS-CoV-2 viral copies this assay can detect is 131 copies/mL. A negative result does not preclude SARS-Cov-2 infection and should not be used as the sole basis for treatment or other patient management decisions. A negative result may occur with  improper specimen collection/handling, submission of specimen other than nasopharyngeal swab, presence of viral mutation(s) within the areas targeted by this assay, and inadequate number of viral copies (<131 copies/mL). A negative result must be combined with clinical observations, patient history, and epidemiological information. The expected result is Negative. Fact Sheet for Patients:  PinkCheek.be Fact Sheet for Healthcare Providers:  GravelBags.it This test is not yet ap proved or cleared by the [place] FDA and  has been authorized for detection and/or diagnosis of SARS-CoV-2 by FDA under an Emergency Use  Authorization (EUA). This EUA will remain  in effect (meaning this test can be used) for the duration of the COVID-19 declaration under Section 564(b)(1) of the Act, 21 U.S.C. section 360bbb-3(b)(1), unless the authorization is terminated or revoked sooner.    Influenza A by PCR NEGATIVE NEGATIVE   Influenza B by PCR NEGATIVE NEGATIVE    Comment: (NOTE) The Xpert Xpress SARS-CoV-2/FLU/RSV assay is intended as an aid in  the diagnosis of influenza from Nasopharyngeal swab specimens and  should not be used as a sole basis for treatment. Nasal washings and  aspirates are unacceptable for Xpert Xpress SARS-CoV-2/FLU/RSV  testing. Fact Sheet for Patients: PinkCheek.be Fact Sheet for Healthcare Providers: GravelBags.it This test is not yet approved or cleared by the [place] FDA and  has been authorized for detection and/or diagnosis of SARS-CoV-2 by  FDA under an Emergency Use Authorization (EUA). This EUA will remain  in effect (meaning this test can be used) for the duration of the  Covid-19 declaration under Section 564(b)(1) of the Act, 21  U.S.C. section 360bbb-3(b)(1), unless the authorization is  terminated or revoked. Performed at [facility], [place], [place] [address]   Lactic acid, plasma     Status: Abnormal   Collection Time: 07/08/19  2:57 AM  Result Value Ref Range   Lactic Acid, Venous 5.1 (HH) 0.5 - 1.9 mmol/L    Comment: CRITICAL RESULT CALLED TO, READ BACK BY  AND VERIFIED WITH [facility] BAXTER AT [phone] ON 07/08/19 RWW Performed at [facility], [address] [place], [place] [address]   Glucose, capillary     Status: Abnormal   Collection Time: 07/08/19  5:12 AM  Result Value Ref Range   Glucose-Capillary 289 (H) 70 - 99 mg/dL    Comment: Glucose reference range applies only to samples taken after fasting for at least 8 hours.  Hemoglobin A1c     Status: Abnormal    Collection Time: 07/08/19  7:02 AM  Result Value Ref Range   Hgb A1c MFr Bld 11.6 (H) 4.8 - 5.6 %    Comment: (NOTE) Pre diabetes:          5.7%-6.4% Diabetes:              >6.4% Glycemic control for   <7.0% adults with diabetes    Mean Plasma Glucose 286.22 mg/dL    Comment: Performed at [facility] [address] [place], [place] [address]  Cortisol-am, blood     Status: None   Collection Time: 07/08/19  7:02 AM  Result Value Ref Range   Cortisol - AM 16.4 6.7 - 22.6 ug/dL    Comment: Performed at [facility], [place] [address] [place], [place] [address]  Troponin I (High Sensitivity)     Status: Abnormal   Collection Time: 07/08/19  7:02 AM  Result Value Ref Range   Troponin I (High Sensitivity) 26 (H) <18 ng/L    Comment: (NOTE) Elevated high sensitivity troponin I (hsTnI) values and significant  changes across serial measurements may suggest ACS but many other  chronic and acute conditions are known to elevate hsTnI results.  Refer to the "Links" section for chest pain algorithms and additional  guidance. Performed at [facility], [address] [place], [place] [address]   Lactic acid, plasma     Status: Abnormal   Collection Time: 07/08/19  7:02 AM  Result Value Ref Range   Lactic Acid, Venous 4.1 (HH) 0.5 - 1.9 mmol/L    Comment: CRITICAL VALUE NOTED. VALUE IS CONSISTENT WITH PREVIOUSLY REPORTED/CALLED VALUE / JAG Performed at [facility], [address] [place], [place] [address]   APTT     Status: Abnormal   Collection Time: 07/08/19  7:02 AM  Result Value Ref Range   aPTT 91 (H) 24 - 36 seconds    Comment:        IF BASELINE aPTT IS ELEVATED, SUGGEST PATIENT RISK ASSESSMENT BE USED TO DETERMINE APPROPRIATE ANTICOAGULANT THERAPY. Performed at [facility], [address] [place], [place] [address]   Glucose, capillary     Status: Abnormal   Collection Time: 07/08/19  7:34 AM  Result Value Ref Range    Glucose-Capillary 339 (H) 70 - 99 mg/dL    Comment: Glucose reference range applies only to samples taken after fasting for at least 8 hours.  MRSA PCR Screening     Status: Abnormal   Collection Time: 07/08/19  7:51 AM   Specimen: Nasal Mucosa; Nasopharyngeal  Result Value Ref Range   MRSA by PCR POSITIVE (A) NEGATIVE    Comment:        The GeneXpert MRSA Assay (FDA approved for NASAL specimens only), is one component of a comprehensive MRSA colonization surveillance program. It is not intended to diagnose MRSA infection nor to guide or monitor treatment for MRSA infections. RESULT CALLED TO, READ BACK BY AND VERIFIED WITH: VICK GREGORY  AT 1305 07/08/19.PMF Performed at [facility], [address] [place], [place] [address]   Fibrinogen     Status: Abnormal   Collection Time: 07/08/19 11:00 AM  Result Value Ref Range   Fibrinogen 589 (H) 210 - 475 mg/dL    Comment: Performed at [facility], [address] [place], [place] [address]  Glucose, capillary     Status: Abnormal   Collection Time: 07/08/19 11:36 AM  Result Value Ref Range   Glucose-Capillary 255 (H) 70 - 99 mg/dL    Comment: Glucose reference range applies only to samples taken after fasting for at least 8 hours.  Glucose, capillary     Status: Abnormal   Collection Time: 07/08/19  4:29 PM  Result Value Ref Range   Glucose-Capillary 285 (H) 70 - 99 mg/dL    Comment: Glucose reference range applies only to samples taken after fasting for at least 8 hours.  Glucose, capillary     Status: Abnormal   Collection Time: 07/08/19  9:27 PM  Result Value Ref Range   Glucose-Capillary 287 (H) 70 - 99 mg/dL    Comment: Glucose reference range applies only to samples taken after fasting for at least 8 hours.  Basic metabolic panel     Status: Abnormal   Collection Time: 07/08/19  9:31 PM  Result Value Ref Range   Sodium 134 (L) 135 - 145 mmol/L   Potassium 4.0 3.5 - 5.1 mmol/L   Chloride 101 98 - 111  mmol/L   CO2 23 22 - 32 mmol/L   Glucose, Bld 309 (H) 70 - 99 mg/dL    Comment: Glucose reference range applies only to samples taken after fasting for at least 8 hours.   BUN 30 (H) 6 - 20 mg/dL   Creatinine, Ser 1.39 (H) 0.44 - 1.00 mg/dL   Calcium 7.9 (L) 8.9 - 10.3 mg/dL   GFR calc non Af Amer 42 (L) >60 mL/min   GFR calc Af Amer 49 (L) >60 mL/min   Anion gap 10 5 - 15    Comment: Performed at [facility], [address] [place], [place] [address]  Comprehensive metabolic panel     Status: Abnormal   Collection Time: 07/09/19  4:20 AM  Result Value Ref Range   Sodium 135 135 - 145 mmol/L   Potassium 3.7 3.5 - 5.1 mmol/L   Chloride 104 98 - 111 mmol/L   CO2 23 22 - 32 mmol/L   Glucose, Bld 348 (H) 70 - 99 mg/dL    Comment: Glucose reference range applies only to samples taken after fasting for at least 8 hours.   BUN 35 (H) 6 - 20 mg/dL   Creatinine, Ser 1.26 (H) 0.44 - 1.00 mg/dL   Calcium 7.8 (L) 8.9 - 10.3 mg/dL   Total Protein 6.2 (L) 6.5 - 8.1 g/dL   Albumin 2.5 (L) 3.5 - 5.0 g/dL   AST 16 15 - 41 U/L   ALT 12 0 - 44 U/L   Alkaline Phosphatase 67 38 - 126 U/L   Total Bilirubin 0.9 0.3 - 1.2 mg/dL   GFR calc non Af Amer 47 (L) >60 mL/min   GFR calc Af Amer 55 (L) >60 mL/min   Anion gap 8 5 - 15    Comment: Performed at [facility], [address], [place], [place] [address]  CBC with Differential/Platelet     Status: Abnormal   Collection Time: 07/09/19  4:20 AM  Result Value  Ref Range   WBC 21.7 (H) 4.0 - 10.5 K/uL   RBC 4.00 3.87 - 5.11 MIL/uL   Hemoglobin 9.7 (L) 12.0 - 15.0 g/dL   HCT 31.5 (L) 36.0 - 46.0 %   MCV 78.8 (L) 80.0 - 100.0 fL   MCH 24.3 (L) 26.0 - 34.0 pg   MCHC 30.8 30.0 - 36.0 g/dL   RDW 16.6 (H) 11.5 - 15.5 %   Platelets 212 150 - 400 K/uL   nRBC 0.0 0.0 - 0.2 %   Neutrophils Relative % 90 %   Neutro Abs 19.6 (H) 1.7 - 7.7 K/uL   Lymphocytes Relative 4 %   Lymphs Abs 0.8 0.7 - 4.0 K/uL   Monocytes Relative 3 %    Monocytes Absolute 0.6 0.1 - 1.0 K/uL   Eosinophils Relative 1 %   Eosinophils Absolute 0.2 0.0 - 0.5 K/uL   Basophils Relative 0 %   Basophils Absolute 0.1 0.0 - 0.1 K/uL   Immature Granulocytes 2 %   Abs Immature Granulocytes 0.44 (H) 0.00 - 0.07 K/uL    Comment: Performed at [facility], [address] [place], [place] [address]  Magnesium     Status: Abnormal   Collection Time: 07/09/19  4:20 AM  Result Value Ref Range   Magnesium 1.4 (L) 1.7 - 2.4 mg/dL    Comment: Performed at [facility], [address] [place], [place] [address]  Phosphorus     Status: Abnormal   Collection Time: 07/09/19  4:20 AM  Result Value Ref Range   Phosphorus 5.6 (H) 2.5 - 4.6 mg/dL    Comment: Performed at [facility], [address] [place], [place] [address]  Fibrin derivatives D-Dimer [facility] only)     Status: Abnormal   Collection Time: 07/09/19  4:20 AM  Result Value Ref Range   Fibrin derivatives D-dimer ([facility]) 2,305.99 (H) 0.00 - 499.00 ng/mL (FEU)    Comment: (NOTE) <> Exclusion of Venous Thromboembolism (VTE) - OUTPATIENT ONLY   (Emergency Department or [place])   0-499 ng/ml (FEU): With a low to intermediate pretest probability                      for VTE this test result excludes the diagnosis                      of VTE.   >499 ng/ml (FEU) : VTE not excluded; additional work up for VTE is                      required. <> Testing on Inpatients and Evaluation of Disseminated Intravascular   Coagulation (DIC) Reference Range:   0-499 ng/ml (FEU) Performed at [facility], [address] [place], [place] [address]   Procalcitonin - Baseline     Status: None   Collection Time: 07/09/19  5:00 AM  Result Value Ref Range   Procalcitonin 17.96 ng/mL    Comment:        Interpretation: PCT >= 10 ng/mL: Important systemic inflammatory response, almost exclusively due to severe bacterial sepsis or septic shock. (NOTE)       Sepsis PCT  Algorithm           Lower Respiratory Tract  Infection PCT Algorithm    ----------------------------     ----------------------------         PCT < 0.25 ng/mL                PCT < 0.10 ng/mL         Strongly encourage             Strongly discourage   discontinuation of antibiotics    initiation of antibiotics    ----------------------------     -----------------------------       PCT 0.25 - 0.50 ng/mL            PCT 0.10 - 0.25 ng/mL               OR       >80% decrease in PCT            Discourage initiation of                                            antibiotics      Encourage discontinuation           of antibiotics    ----------------------------     -----------------------------         PCT >= 0.50 ng/mL              PCT 0.26 - 0.50 ng/mL                AND       <80% decrease in PCT             Encourage initiation of                                             antibiotics       Encourage continuation           of antibiotics    ----------------------------     -----------------------------        PCT >= 0.50 ng/mL                  PCT > 0.50 ng/mL               AND         increase in PCT                  Strongly encourage                                      initiation of antibiotics    Strongly encourage escalation           of antibiotics                                     -----------------------------                                           PCT <= 0.25 ng/mL  OR                                        > 80% decrease in PCT                                     Discontinue / Do not initiate                                             antibiotics Performed at [facility], [address], [place] [address]   Troponin I (High Sensitivity)     Status: Abnormal   Collection Time: 07/09/19  5:00 AM  Result Value Ref Range   Troponin I (High Sensitivity) 24 (H) <18 ng/L     Comment: (NOTE) Elevated high sensitivity troponin I (hsTnI) values and significant  changes across serial measurements may suggest ACS but many other  chronic and acute conditions are known to elevate hsTnI results.  Refer to the "Links" section for chest pain algorithms and additional  guidance. Performed at [facility], [address] [place], [place] [address]   Blood gas, arterial     Status: Abnormal   Collection Time: 07/09/19  6:21 AM  Result Value Ref Range   FIO2 0.40    Delivery systems NASAL CANNULA    pH, Arterial 7.38 7.350 - 7.450   pCO2 arterial 36 32.0 - 48.0 mmHg   pO2, Arterial 88 83.0 - 108.0 mmHg   Bicarbonate 21.3 20.0 - 28.0 mmol/L   Acid-base deficit 3.3 (H) 0.0 - 2.0 mmol/L   O2 Saturation 96.6 %   Patient temperature 37.0    Collection site LEFT RADIAL    Sample type ARTERIAL DRAW    Allens test (pass/fail) PASS PASS    Comment: Performed at [facility], [address] [place], [place] [address]  Glucose, capillary     Status: Abnormal   Collection Time: 07/09/19  7:20 AM  Result Value Ref Range   Glucose-Capillary 349 (H) 70 - 99 mg/dL    Comment: Glucose reference range applies only to samples taken after fasting for at least 8 hours.  Glucose, capillary     Status: Abnormal   Collection Time: 07/09/19 11:33 AM  Result Value Ref Range   Glucose-Capillary 358 (H) 70 - 99 mg/dL    Comment: Glucose reference range applies only to samples taken after fasting for at least 8 hours.  Heparin level (unfractionated)     Status: None   Collection Time: 07/09/19 12:46 PM  Result Value Ref Range   Heparin Unfractionated 0.39 0.30 - 0.70 IU/mL    Comment: (NOTE) If heparin results are below expected values, and patient dosage has  been confirmed, suggest follow up testing of antithrombin III levels. Performed at [facility], [address] [place], [place] [address]    No components found for: ESR, C REACTIVE  PROTEIN MICRO: Recent Results (from the past 720 hour(s))  Urine culture     Status: None   Collection Time: 07/07/19 12:14 AM   Specimen: In/Out Cath Urine  Result Value Ref Range Status   Specimen Description   Final    IN/OUT CATH URINE  Performed at [facility], [address] [place], [place] [address]    Special Requests   Final    NONE Performed at [facility], [address] [place], [place] [address]    Culture   Final    NO GROWTH Performed at [facility], [place] [address], [place], [place] [address]    Report Status 07/09/2019 FINAL  Final  Blood Culture (routine x 2)     Status: Abnormal (Preliminary result)   Collection Time: 07/08/19 12:14 AM   Specimen: BLOOD  Result Value Ref Range Status   Specimen Description   Final    BLOOD LEFT ANTECUBITAL Performed at [facility], [address], [place], [place] [address]    Special Requests   Final    BOTTLES DRAWN AEROBIC AND ANAEROBIC Blood Culture adequate volume Performed at [facility], [address] [place], [place] [address]    Culture  Setup Time   Final    GRAM POSITIVE COCCI IN BOTH AEROBIC AND ANAEROBIC BOTTLES CRITICAL RESULT CALLED TO, READ BACK BY AND VERIFIED WITH: ABBY ELLINGTON AT 8756 ON 07/08/19 SNG Performed at [facility], [place] [address] [place], [place] [address]    Culture STAPHYLOCOCCUS AUREUS (A)  Final   Report Status PENDING  Incomplete  Blood Culture (routine x 2)     Status: Abnormal (Preliminary result)   Collection Time: 07/08/19 12:14 AM   Specimen: BLOOD  Result Value Ref Range Status   Specimen Description   Final    BLOOD RIGHT ANTECUBITAL Performed at [facility], [address] [place], [place] [address]    Special Requests   Final    BOTTLES DRAWN AEROBIC AND ANAEROBIC Blood Culture results may not be optimal due to an excessive volume of blood received in culture bottles Performed at [facility], [address], [place], [place] [address]    Culture  Setup Time   Final    GRAM POSITIVE COCCI IN BOTH AEROBIC AND ANAEROBIC BOTTLES CRITICAL VALUE NOTED.  VALUE IS CONSISTENT WITH PREVIOUSLY REPORTED AND CALLED VALUE. Performed at [facility], [address] [place], [place] [address]    Culture STAPHYLOCOCCUS AUREUS (A)  Final   Report Status PENDING  Incomplete  Blood Culture ID Panel (Reflexed)     Status: Abnormal   Collection Time: 07/08/19 12:14 AM  Result Value Ref Range Status   Enterococcus species NOT DETECTED NOT DETECTED Final   Listeria monocytogenes NOT DETECTED NOT DETECTED Final   Staphylococcus species DETECTED (A) NOT DETECTED Final    Comment: CRITICAL RESULT CALLED TO, READ BACK BY AND VERIFIED WITH: CHARLES SHANLEVER AT 1093 ON 07/08/19 SNG    Staphylococcus aureus (BCID) DETECTED (A) NOT DETECTED Final    Comment: Methicillin (oxacillin)-resistant Staphylococcus aureus (MRSA). MRSA is predictably resistant to beta-lactam antibiotics (except ceftaroline). Preferred therapy is vancomycin unless clinically contraindicated. Patient requires contact precautions if  hospitalized. CRITICAL RESULT CALLED TO, READ BACK BY AND VERIFIED WITH: CHARLES SHANLEVER AT 2355 ON 07/08/19 SNG    Methicillin resistance DETECTED (A) NOT DETECTED Final    Comment: CRITICAL RESULT CALLED TO, READ BACK BY AND VERIFIED WITH: CHARLES SHANLEVER AT 7322 ON 07/08/19 SNG    Streptococcus species NOT DETECTED NOT DETECTED Final   Streptococcus agalactiae NOT DETECTED NOT DETECTED Final   Streptococcus pneumoniae NOT DETECTED NOT DETECTED Final   Streptococcus pyogenes NOT DETECTED NOT DETECTED Final   Acinetobacter baumannii NOT DETECTED NOT  DETECTED Final   Enterobacteriaceae species NOT DETECTED NOT DETECTED Final   Enterobacter cloacae complex NOT DETECTED NOT DETECTED Final   Escherichia coli NOT DETECTED NOT DETECTED Final   Klebsiella oxytoca NOT DETECTED NOT  DETECTED Final   Klebsiella pneumoniae NOT DETECTED NOT DETECTED Final   Proteus species NOT DETECTED NOT DETECTED Final   Serratia marcescens NOT DETECTED NOT DETECTED Final   Haemophilus influenzae NOT DETECTED NOT DETECTED Final   Neisseria meningitidis NOT DETECTED NOT DETECTED Final   Pseudomonas aeruginosa NOT DETECTED NOT DETECTED Final   Candida albicans NOT DETECTED NOT DETECTED Final   Candida glabrata NOT DETECTED NOT DETECTED Final   Candida krusei NOT DETECTED NOT DETECTED Final   Candida parapsilosis NOT DETECTED NOT DETECTED Final   Candida tropicalis NOT DETECTED NOT DETECTED Final    Comment: Performed at [facility], [address] [place], [place] [address]  Respiratory Panel by RT PCR (Flu A&B, Covid) - Nasopharyngeal Swab     Status: None   Collection Time: 07/08/19  1:18 AM   Specimen: Nasopharyngeal Swab  Result Value Ref Range Status   SARS Coronavirus 2 by RT PCR NEGATIVE NEGATIVE Final    Comment: (NOTE) SARS-CoV-2 target nucleic acids are NOT DETECTED. The SARS-CoV-2 RNA is generally detectable in upper respiratoy specimens during the acute phase of infection. The lowest concentration of SARS-CoV-2 viral copies this assay can detect is 131 copies/mL. A negative result does not preclude SARS-Cov-2 infection and should not be used as the sole basis for treatment or other patient management decisions. A negative result may occur with  improper specimen collection/handling, submission of specimen other than nasopharyngeal swab, presence of viral mutation(s) within the areas targeted by this assay, and inadequate number of viral copies (<131 copies/mL). A negative result must be combined with clinical observations, patient history, and epidemiological information. The expected result is Negative. Fact Sheet for Patients:  PinkCheek.be Fact Sheet for Healthcare Providers:  GravelBags.it This  test is not yet ap proved or cleared by the [place] FDA and  has been authorized for detection and/or diagnosis of SARS-CoV-2 by FDA under an Emergency Use Authorization (EUA). This EUA will remain  in effect (meaning this test can be used) for the duration of the COVID-19 declaration under Section 564(b)(1) of the Act, 21 U.S.C. section 360bbb-3(b)(1), unless the authorization is terminated or revoked sooner.    Influenza A by PCR NEGATIVE NEGATIVE Final   Influenza B by PCR NEGATIVE NEGATIVE Final    Comment: (NOTE) The Xpert Xpress SARS-CoV-2/FLU/RSV assay is intended as an aid in  the diagnosis of influenza from Nasopharyngeal swab specimens and  should not be used as a sole basis for treatment. Nasal washings and  aspirates are unacceptable for Xpert Xpress SARS-CoV-2/FLU/RSV  testing. Fact Sheet for Patients: PinkCheek.be Fact Sheet for Healthcare Providers: GravelBags.it This test is not yet approved or cleared by the [place] FDA and  has been authorized for detection and/or diagnosis of SARS-CoV-2 by  FDA under an Emergency Use Authorization (EUA). This EUA will remain  in effect (meaning this test can be used) for the duration of the  Covid-19 declaration under Section 564(b)(1) of the Act, 21  U.S.C. section 360bbb-3(b)(1), unless the authorization is  terminated or revoked. Performed at [facility], [address] [place], [place] [address]   MRSA PCR Screening     Status: Abnormal   Collection Time: 07/08/19  7:51 AM   Specimen: Nasal Mucosa; Nasopharyngeal  Result Value Ref Range Status   MRSA by PCR POSITIVE (A) NEGATIVE Final    Comment:        The GeneXpert MRSA Assay (FDA approved for NASAL specimens only), is one component of a comprehensive MRSA colonization surveillance program. It is not intended to diagnose MRSA infection nor to guide or monitor treatment for MRSA  infections. RESULT CALLED TO, READ BACK BY AND VERIFIED WITH: VICK GREGORY AT 4503 07/08/19.PMF Performed at [facility], [address] [place], [place] [address]     IMAGING: CT HEAD WO CONTRAST  Result Date: 07/09/2019 CLINICAL DATA:  59 year old female with history of altered mental status. EXAM: CT HEAD WITHOUT CONTRAST TECHNIQUE: Contiguous axial images were obtained from the base of the skull through the vertex without intravenous contrast. COMPARISON:  No priors. FINDINGS: Brain: Patchy areas of mild decreased attenuation are noted throughout the deep and periventricular white matter of the cerebral hemispheres bilaterally, compatible with chronic microvascular ischemic disease. No evidence of acute infarction, hemorrhage, hydrocephalus, extra-axial collection or mass lesion/mass effect. Vascular: No hyperdense vessel or unexpected calcification. Skull: Normal. Negative for fracture or focal lesion. Sinuses/Orbits: No acute finding. Other: None. IMPRESSION: 1. No acute intracranial abnormalities. 2. Very mild chronic microvascular ischemic changes in the cerebral white matter, as above. Electronically Signed   By: Vinnie Langton M.D.   On: 07/09/2019 09:04   CT Chest W Contrast  Result Date: 07/08/2019 CLINICAL DATA:  Sepsis, respiratory illness, poor compliance with follow-up EXAM: CT CHEST, ABDOMEN, AND PELVIS WITH CONTRAST TECHNIQUE: Multidetector CT imaging of the chest, abdomen and pelvis was performed following the standard protocol during bolus administration of intravenous contrast. CONTRAST:  138m OMNIPAQUE IOHEXOL 350 MG/ML SOLN COMPARISON:  CT 05/22/2018, radiograph 05/23/2019, 07/08/2019 FINDINGS: CT CHEST FINDINGS Cardiovascular: Normal cardiac size. Trace pericardial fluid. Coronary artery calcifications are present predominantly within the left circumflex. The aortic root is suboptimally assessed given cardiac pulsation artifact. The aorta is normal caliber. Normal  3 vessel branching of the aortic arch with minimal plaque in the right brachiocephalic origin. Proximal great vessels are otherwise unremarkable. Central pulmonary arteries are normal caliber. No large central filling defects on this non tailored examination of the pulmonary arteries. Major venous structures are free of acute abnormality. Mediastinum/Nodes: No mediastinal fluid or gas. Normal thyroid gland and thoracic inlet. No acute abnormality of the trachea or esophagus. Patient was imaged during exhalation with posterior bowing of the trachea. No worrisome mediastinal, hilar or axillary adenopathy. Lungs/Pleura: Mixed ground-glass and consolidative opacity seen towards the lung apices bilaterally, poorly assessed given extensive respiratory motion artifact at the time of exam. Lung bases are relatively clear aside from some hypoventilatory changes likely accentuated by imaging during exhalation as detailed above. No pneumothorax or effusion. No visible nodules or masses within the limitations of this exam imposed by extensive severe respiratory motion artifact. Musculoskeletal: Multilevel degenerative changes are present in the imaged portions of the spine. Slight straightening of normal thoracic kyphosis. No acute or suspicious osseous lesions. CT ABDOMEN PELVIS FINDINGS Hepatobiliary: Portion of the liver protrudes anteriorly into the lax anterior abdominal wall with an irregular contour of segment 4 of the liver. No focal liver lesion is seen. Uniform liver enhancement. Smooth liver surface. Patient is post cholecystectomy. Slight prominence of the extrahepatic biliary tree is likely related to reservoir effect post cholecystectomy. Pancreas: Unremarkable. No pancreatic ductal dilatation or surrounding inflammatory changes. Spleen: There is a questionable area of geographic hypoattenuation affecting the posterior spleen and periphery concerning  for developing splenic infarction which was not readily apparent  on comparison exam. Area of concern is excluded from the delayed phase imaging. Adrenals/Urinary Tract: Normal adrenal glands. Extensive bilateral perinephric stranding and hazy retroperitoneal attenuation. No visible or contour deforming renal lesions. Delayed excretion seen bilaterally. No urolithiasis or hydronephrosis. Minimal bladder wall thickening and faint perivesicular haze. No bladder debris, calculus or other acute abnormality. Stomach/Bowel: Distal esophagus, stomach and duodenal sweep are unremarkable. No small bowel wall thickening or dilatation. No evidence of obstruction. A normal appendix is visualized. No colonic dilatation or wall thickening. Vascular/Lymphatic: Atherosclerotic plaque within the normal caliber aorta. Few borderline enlarged left inguinal nodes including a 12 mm left inguinal node (2/105) are similar to a comparison study 04/15/2013. No new enlarged or enlarging nodes are seen. Reproductive: Uterus is surgically absent. No concerning adnexal lesions. Other: Marked laxity of the anterior abdominal wall. Are postsurgical changes of the anterior abdominal wall with fat and simple fluid containing collection to the right of the umbilicus (6/28). Mild edematous changes in the posterior soft tissues. Sacral nerve stimulator is seen in the left gluteal tissues. Contiguous lead enters at the left S3 neural foramen. Musculoskeletal: No acute osseous abnormality or suspicious osseous lesion. Multilevel degenerative changes are present in the imaged portions of the spine. Features most pronounced at the L4-5 level with a sacralized L5 vertebral body. IMPRESSION: 1. Mixed ground-glass and consolidative opacity seen towards the lung apices bilaterally, poorly assessed given extensive respiratory motion artifact at the time of exam. Differential diagnosis could reflect residual or recurrent infection change in the setting of prior COVID-19. 2. Extensive bilateral perinephric stranding and hazy  retroperitoneal attenuation. The appearance is nonspecific, and can be seen with urinary tract infection/pyelonephritis with minimal thickening of the bladder compatible with cystitis as well. Consider correlation with urinalysis. 3. Questionable area of geographic hypoattenuation affecting the posterior spleen and periphery of the liver, may represent an area of developing splenic infarction. 4. Marked laxity of the anterior abdominal wall with periumbilical postsurgical changes and a fat and simple fluid containing collection to the right of the umbilicus. 5. Borderline enlarged left inguinal lymph nodes are similar to a comparison study 04/15/2013 and likely reactive. 6. Aortic Atherosclerosis (ICD10-I70.0). Electronically Signed   By: Lovena Le M.D.   On: 07/08/2019 03:03   CT ANGIO CHEST PE W OR WO CONTRAST  Result Date: 07/09/2019 CLINICAL DATA:  Shortness of breath. Recent COVID-19. Elevated D-dimer EXAM: CT ANGIOGRAPHY CHEST WITH CONTRAST TECHNIQUE: Multidetector CT imaging of the chest was performed using the standard protocol during bolus administration of intravenous contrast. Multiplanar CT image reconstructions and MIPs were obtained to evaluate the vascular anatomy. CONTRAST:  23m OMNIPAQUE IOHEXOL 350 MG/ML SOLN COMPARISON:  07/08/2019 FINDINGS: Cardiovascular: Central line to the distal SVC. Heart size upper limits normal. No pericardial effusion. Satisfactory opacification of pulmonary arteries noted, and there is no evidence of pulmonary emboli. Patient breathing through the acquisition degrades some of the images. Adequate contrast opacification of the thoracic aorta with no evidence of dissection, aneurysm, or stenosis. There is classic 3-vessel brachiocephalic arch anatomy without proximal stenosis. Mediastinum/Nodes: No hilar or mediastinal adenopathy. Lungs/Pleura: No pleural effusion. No pneumothorax. Coarse interstitial opacities in both upper lobes and posteriorly in the lung bases.  No new airspace consolidation or discrete nodule identified. Upper Abdomen: Wedge-shaped hypodense regions in the spleen suggesting infarcts, more conspicuous than on prior study. Surgical clips around the GE junction. No acute finding. Musculoskeletal: Anterior vertebral endplate spurring at multiple  levels in the lower thoracic spine. No fracture or worrisome bone lesion. Review of the MIP images confirms the above findings. IMPRESSION: 1. Negative for acute PE or thoracic aortic dissection. 2. Possible splenic infarcts. 3. Stable pulmonary parenchymal changes since previous day's exam. Electronically Signed   By: Lucrezia Europe M.D.   On: 07/09/2019 12:36   CT ABDOMEN PELVIS W CONTRAST  Result Date: 07/08/2019 CLINICAL DATA:  Sepsis, respiratory illness, poor compliance with follow-up EXAM: CT CHEST, ABDOMEN, AND PELVIS WITH CONTRAST TECHNIQUE: Multidetector CT imaging of the chest, abdomen and pelvis was performed following the standard protocol during bolus administration of intravenous contrast. CONTRAST:  158m OMNIPAQUE IOHEXOL 350 MG/ML SOLN COMPARISON:  CT 05/22/2018, radiograph 05/23/2019, 07/08/2019 FINDINGS: CT CHEST FINDINGS Cardiovascular: Normal cardiac size. Trace pericardial fluid. Coronary artery calcifications are present predominantly within the left circumflex. The aortic root is suboptimally assessed given cardiac pulsation artifact. The aorta is normal caliber. Normal 3 vessel branching of the aortic arch with minimal plaque in the right brachiocephalic origin. Proximal great vessels are otherwise unremarkable. Central pulmonary arteries are normal caliber. No large central filling defects on this non tailored examination of the pulmonary arteries. Major venous structures are free of acute abnormality. Mediastinum/Nodes: No mediastinal fluid or gas. Normal thyroid gland and thoracic inlet. No acute abnormality of the trachea or esophagus. Patient was imaged during exhalation with posterior  bowing of the trachea. No worrisome mediastinal, hilar or axillary adenopathy. Lungs/Pleura: Mixed ground-glass and consolidative opacity seen towards the lung apices bilaterally, poorly assessed given extensive respiratory motion artifact at the time of exam. Lung bases are relatively clear aside from some hypoventilatory changes likely accentuated by imaging during exhalation as detailed above. No pneumothorax or effusion. No visible nodules or masses within the limitations of this exam imposed by extensive severe respiratory motion artifact. Musculoskeletal: Multilevel degenerative changes are present in the imaged portions of the spine. Slight straightening of normal thoracic kyphosis. No acute or suspicious osseous lesions. CT ABDOMEN PELVIS FINDINGS Hepatobiliary: Portion of the liver protrudes anteriorly into the lax anterior abdominal wall with an irregular contour of segment 4 of the liver. No focal liver lesion is seen. Uniform liver enhancement. Smooth liver surface. Patient is post cholecystectomy. Slight prominence of the extrahepatic biliary tree is likely related to reservoir effect post cholecystectomy. Pancreas: Unremarkable. No pancreatic ductal dilatation or surrounding inflammatory changes. Spleen: There is a questionable area of geographic hypoattenuation affecting the posterior spleen and periphery concerning for developing splenic infarction which was not readily apparent on comparison exam. Area of concern is excluded from the delayed phase imaging. Adrenals/Urinary Tract: Normal adrenal glands. Extensive bilateral perinephric stranding and hazy retroperitoneal attenuation. No visible or contour deforming renal lesions. Delayed excretion seen bilaterally. No urolithiasis or hydronephrosis. Minimal bladder wall thickening and faint perivesicular haze. No bladder debris, calculus or other acute abnormality. Stomach/Bowel: Distal esophagus, stomach and duodenal sweep are unremarkable. No small  bowel wall thickening or dilatation. No evidence of obstruction. A normal appendix is visualized. No colonic dilatation or wall thickening. Vascular/Lymphatic: Atherosclerotic plaque within the normal caliber aorta. Few borderline enlarged left inguinal nodes including a 12 mm left inguinal node (2/105) are similar to a comparison study 04/15/2013. No new enlarged or enlarging nodes are seen. Reproductive: Uterus is surgically absent. No concerning adnexal lesions. Other: Marked laxity of the anterior abdominal wall. Are postsurgical changes of the anterior abdominal wall with fat and simple fluid containing collection to the right of the umbilicus (29/93. Mild edematous changes  in the posterior soft tissues. Sacral nerve stimulator is seen in the left gluteal tissues. Contiguous lead enters at the left S3 neural foramen. Musculoskeletal: No acute osseous abnormality or suspicious osseous lesion. Multilevel degenerative changes are present in the imaged portions of the spine. Features most pronounced at the L4-5 level with a sacralized L5 vertebral body. IMPRESSION: 1. Mixed ground-glass and consolidative opacity seen towards the lung apices bilaterally, poorly assessed given extensive respiratory motion artifact at the time of exam. Differential diagnosis could reflect residual or recurrent infection change in the setting of prior COVID-19. 2. Extensive bilateral perinephric stranding and hazy retroperitoneal attenuation. The appearance is nonspecific, and can be seen with urinary tract infection/pyelonephritis with minimal thickening of the bladder compatible with cystitis as well. Consider correlation with urinalysis. 3. Questionable area of geographic hypoattenuation affecting the posterior spleen and periphery of the liver, may represent an area of developing splenic infarction. 4. Marked laxity of the anterior abdominal wall with periumbilical postsurgical changes and a fat and simple fluid containing  collection to the right of the umbilicus. 5. Borderline enlarged left inguinal lymph nodes are similar to a comparison study 04/15/2013 and likely reactive. 6. Aortic Atherosclerosis (ICD10-I70.0). Electronically Signed   By: Lovena Le M.D.   On: 07/08/2019 03:03   US Venous Img Lower Bilateral (DVT)  Result Date: 07/08/2019 CLINICAL DATA:  59 year old female with elevated D-dimer and a history of prior COVID infection in December 2020 EXAM: BILATERAL LOWER EXTREMITY VENOUS DOPPLER ULTRASOUND TECHNIQUE: Gray-scale sonography with graded compression, as well as color Doppler and duplex ultrasound were performed to evaluate the lower extremity deep venous systems from the level of the common femoral vein and including the common femoral, femoral, profunda femoral, popliteal and calf veins including the posterior tibial, peroneal and gastrocnemius veins when visible. The superficial great saphenous vein was also interrogated. Spectral Doppler was utilized to evaluate flow at rest and with distal augmentation maneuvers in the common femoral, femoral and popliteal veins. COMPARISON:  None. FINDINGS: RIGHT LOWER EXTREMITY Common Femoral Vein: No evidence of thrombus. Normal compressibility, respiratory phasicity and response to augmentation. Saphenofemoral Junction: No evidence of thrombus. Normal compressibility and flow on color Doppler imaging. Profunda Femoral Vein: No evidence of thrombus. Normal compressibility and flow on color Doppler imaging. Femoral Vein: No evidence of thrombus. Normal compressibility, respiratory phasicity and response to augmentation. Popliteal Vein: No evidence of thrombus. Normal compressibility, respiratory phasicity and response to augmentation. Calf Veins: No evidence of thrombus. Normal compressibility and flow on color Doppler imaging. Superficial Great Saphenous Vein: No evidence of thrombus. Normal compressibility. Venous Reflux:  None. Other Findings:  None. LEFT LOWER  EXTREMITY Common Femoral Vein: No evidence of thrombus. Normal compressibility, respiratory phasicity and response to augmentation. Saphenofemoral Junction: No evidence of thrombus. Normal compressibility and flow on color Doppler imaging. Profunda Femoral Vein: No evidence of thrombus. Normal compressibility and flow on color Doppler imaging. Femoral Vein: No evidence of thrombus. Normal compressibility, respiratory phasicity and response to augmentation. Popliteal Vein: No evidence of thrombus. Normal compressibility, respiratory phasicity and response to augmentation. Calf Veins: No evidence of thrombus. Normal compressibility and flow on color Doppler imaging. Superficial Great Saphenous Vein: No evidence of thrombus. Normal compressibility. Venous Reflux:  None. Other Findings:  None. IMPRESSION: No evidence of deep venous thrombosis in either lower extremity. Electronically Signed   By: Jacqulynn Cadet M.D.   On: 07/08/2019 09:54   DG Chest Port 1 View  Result Date: 07/08/2019 CLINICAL DATA:  New central  line placement on the right. EXAM: PORTABLE CHEST 1 VIEW COMPARISON:  CT chest performed earlier the same day 07/08/2019, chest radiograph 07/08/2019 FINDINGS: Interval placement of a right IJ approach central venous catheter with tip projecting in the region of the caudal SVC. Cardiomediastinal silhouette unchanged. Again demonstrated are bilateral interstitial as well as more ill-defined airspace opacities. No evidence of pleural effusion or pneumothorax. IMPRESSION: Interval placement of a right IJ approach central venous catheter with tip projecting in the region of the caudal SVC. Similar appearance of bilateral interstitial and ill-defined airspace opacities which may reflect edema or pneumonia. Electronically Signed   By: Kellie Simmering DO   On: 07/08/2019 07:10   DG Chest Port 1 View  Result Date: 07/08/2019 CLINICAL DATA:  Sepsis EXAM: PORTABLE CHEST 1 VIEW COMPARISON:  05/23/2019 FINDINGS:  Cardiomegaly, vascular congestion. Interstitial prominence could reflect early interstitial edema. No effusions. No acute bony abnormality. IMPRESSION: Cardiomegaly with vascular congestion and possible early interstitial edema. Electronically Signed   By: Rolm Baptise M.D.   On: 07/08/2019 00:07   ECHOCARDIOGRAM COMPLETE  Result Date: 07/08/2019    ECHOCARDIOGRAM REPORT   Patient Name:   [name] [name] Date of Exam: 07/08/2019 Medical Rec #:  [id number]       Height:       70.0 in Accession #:    [id number]      Weight:       350.0 lb Date of Birth:  Dec 03, 1960       BSA:          2.648 m Patient Age:    89 years        BP:           99/52 mmHg Patient Gender: F               HR:           113 bpm. Exam Location:  [facility] Procedure: 2D Echo STAT ECHO Indications:     Chest Pain 786.50/ R07.9  History:         Patient has prior history of Echocardiogram examinations, most                  recent 04/10/2019.  Sonographer:     Arville Go RDCS Referring Phys:  [id number] Awilda Bill Diagnosing Phys: Ida Rogue MD  Sonographer Comments: Technically difficult study due to poor echo windows and patient is morbidly obese. Image acquisition challenging due to patient body habitus. IMPRESSIONS  1. Left ventricular ejection fraction, by estimation, is 60 to 65%. The left ventricle has normal function. The left ventricle has no regional wall motion abnormalities. Left ventricular diastolic parameters are consistent with Grade I diastolic dysfunction (impaired relaxation).  2. Right ventricular systolic function is normal. The right ventricular size is normal.  3. The inferior vena cava is normal in size with greater than 50% respiratory variability, suggesting right atrial pressure of 3 mmHg. FINDINGS  Left Ventricle: Left ventricular ejection fraction, by estimation, is 60 to 65%. The left ventricle has normal function. The left ventricle has no regional wall motion abnormalities. The left ventricular internal  cavity size was normal in size. There is  no left ventricular hypertrophy. Left ventricular diastolic parameters are consistent with Grade I diastolic dysfunction (impaired relaxation). Right Ventricle: The right ventricular size is normal. No increase in right ventricular wall thickness. Right ventricular systolic function is normal. Left Atrium: Left atrial size was normal in size. Right Atrium: Right atrial size  was normal in size. Pericardium: There is no evidence of pericardial effusion. Mitral Valve: The mitral valve is normal in structure. Normal mobility of the mitral valve leaflets. No evidence of mitral valve regurgitation. No evidence of mitral valve stenosis. Tricuspid Valve: The tricuspid valve is normal in structure. Tricuspid valve regurgitation is mild . No evidence of tricuspid stenosis. Aortic Valve: The aortic valve was not well visualized. Aortic valve regurgitation is not visualized. No aortic stenosis is present. Aortic valve peak gradient measures 11.6 mmHg. Pulmonic Valve: The pulmonic valve was normal in structure. Pulmonic valve regurgitation is not visualized. No evidence of pulmonic stenosis. Aorta: The aortic root is normal in size and structure. Venous: The inferior vena cava is normal in size with greater than 50% respiratory variability, suggesting right atrial pressure of 3 mmHg. IAS/Shunts: No atrial level shunt detected by color flow Doppler.  LEFT VENTRICLE PLAX 2D LVIDd:         4.39 cm  Diastology LVIDs:         2.99 cm  LV e' lateral:   9.90 cm/s LV PW:         1.36 cm  LV E/e' lateral: 9.6 LV IVS:        1.38 cm  LV e' medial:    4.68 cm/s LVOT diam:     2.10 cm  LV E/e' medial:  20.3 LV SV:         88 LV SV Index:   33 LVOT Area:     3.46 cm  RIGHT VENTRICLE RV Basal diam:  3.01 cm RV S prime:     14.60 cm/s TAPSE (M-mode): 1.7 cm LEFT ATRIUM             Index       RIGHT ATRIUM           Index LA diam:        2.40 cm 0.91 cm/m  RA Area:     14.70 cm LA Vol (A2C):   37.0  ml 13.97 ml/m RA Volume:   36.90 ml  13.94 ml/m LA Vol (A4C):   52.9 ml 19.98 ml/m LA Biplane Vol: 45.6 ml 17.22 ml/m  AORTIC VALVE                PULMONIC VALVE AV Area (Vmax): 2.77 cm    PV Vmax:       1.13 m/s AV Vmax:        170.00 cm/s PV Peak grad:  5.1 mmHg AV Peak Grad:   11.6 mmHg LVOT Vmax:      136.00 cm/s LVOT Vmean:     93.400 cm/s LVOT VTI:       0.255 m  AORTA Ao Root diam: 3.00 cm MITRAL VALVE MV Area (PHT): 5.54 cm     SHUNTS MV Decel Time: 137 msec     Systemic VTI:  0.26 m MV E velocity: 95.10 cm/s   Systemic Diam: 2.10 cm MV A velocity: 106.00 cm/s MV E/A ratio:  0.90 Ida Rogue MD Electronically signed by Ida Rogue MD Signature Date/Time: 07/08/2019/10:57:12 AM    Final     Assessment:   MANAHIL VANZILE is a 59 y.o. female with poorly controlled DM, massive obesity, recent COVID now with MRSA bacteremia of unknown source. No obvious skin and soft tissue infection. 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  Clinically stabilizing.  Recommendations Repeat bcx ordered TEE when stable from resp viewpoint to help decide on duration. Cont vanco Will need to monitor for metastatic sites of infection. Thank you very much for allowing me to participate in the care of this patient. Please call with questions.   Cheral Marker. Ola Spurr, MD

## 2019-07-09 NOTE — Progress Notes (Signed)
Per notes, patient is disoriented x 4. CSW attempted call to patient's daughter, Forde Dandy, to complete Readmission Screen. Left a voicemail requesting a return call at her convenience.  Alfonso Ramus, [place] [phone]

## 2019-07-10 ENCOUNTER — Inpatient Hospital Stay: Payer: Medicaid Other

## 2019-07-10 LAB — CBC WITH DIFFERENTIAL/PLATELET
Abs Immature Granulocytes: 0.57 10*3/uL — ABNORMAL HIGH (ref 0.00–0.07)
Basophils Absolute: 0 10*3/uL (ref 0.0–0.1)
Basophils Relative: 0 %
Eosinophils Absolute: 0 10*3/uL (ref 0.0–0.5)
Eosinophils Relative: 0 %
HCT: 31.3 % — ABNORMAL LOW (ref 36.0–46.0)
Hemoglobin: 9.4 g/dL — ABNORMAL LOW (ref 12.0–15.0)
Immature Granulocytes: 3 %
Lymphocytes Relative: 7 %
Lymphs Abs: 1.2 10*3/uL (ref 0.7–4.0)
MCH: 23.7 pg — ABNORMAL LOW (ref 26.0–34.0)
MCHC: 30 g/dL (ref 30.0–36.0)
MCV: 79 fL — ABNORMAL LOW (ref 80.0–100.0)
Monocytes Absolute: 0.9 10*3/uL (ref 0.1–1.0)
Monocytes Relative: 5 %
Neutro Abs: 15.8 10*3/uL — ABNORMAL HIGH (ref 1.7–7.7)
Neutrophils Relative %: 85 %
Platelets: 191 10*3/uL (ref 150–400)
RBC: 3.96 MIL/uL (ref 3.87–5.11)
RDW: 17.2 % — ABNORMAL HIGH (ref 11.5–15.5)
WBC: 18.6 10*3/uL — ABNORMAL HIGH (ref 4.0–10.5)
nRBC: 0 % (ref 0.0–0.2)

## 2019-07-10 LAB — CULTURE, BLOOD (ROUTINE X 2): Special Requests: ADEQUATE

## 2019-07-10 LAB — VANCOMYCIN, PEAK: Vancomycin Pk: 44 ug/mL — ABNORMAL HIGH (ref 30–40)

## 2019-07-10 LAB — COMPREHENSIVE METABOLIC PANEL
ALT: 30 U/L (ref 0–44)
AST: 53 U/L — ABNORMAL HIGH (ref 15–41)
Albumin: 2.6 g/dL — ABNORMAL LOW (ref 3.5–5.0)
Alkaline Phosphatase: 62 U/L (ref 38–126)
Anion gap: 8 (ref 5–15)
BUN: 47 mg/dL — ABNORMAL HIGH (ref 6–20)
CO2: 23 mmol/L (ref 22–32)
Calcium: 8 mg/dL — ABNORMAL LOW (ref 8.9–10.3)
Chloride: 107 mmol/L (ref 98–111)
Creatinine, Ser: 1.15 mg/dL — ABNORMAL HIGH (ref 0.44–1.00)
GFR calc Af Amer: >60 mL/min (ref >60)
GFR calc non Af Amer: 53 mL/min — ABNORMAL LOW (ref >60)
Glucose, Bld: 262 mg/dL — ABNORMAL HIGH (ref 70–99)
Potassium: 3.8 mmol/L (ref 3.5–5.1)
Sodium: 138 mmol/L (ref 135–145)
Total Bilirubin: 0.8 mg/dL (ref 0.3–1.2)
Total Protein: 6.3 g/dL — ABNORMAL LOW (ref 6.5–8.1)

## 2019-07-10 LAB — BLOOD GAS, ARTERIAL
Acid-base deficit: 3.3 mmol/L — ABNORMAL HIGH (ref 0.0–2.0)
Bicarbonate: 18.6 mmol/L — ABNORMAL LOW (ref 20.0–28.0)
FIO2: 0.32
O2 Saturation: 97.3 %
Patient temperature: 37
pCO2 arterial: 25 mmHg — ABNORMAL LOW (ref 32.0–48.0)
pH, Arterial: 7.48 — ABNORMAL HIGH (ref 7.350–7.450)
pO2, Arterial: 87 mmHg (ref 83.0–108.0)

## 2019-07-10 LAB — PHOSPHORUS: Phosphorus: 4 mg/dL (ref 2.5–4.6)

## 2019-07-10 LAB — GLUCOSE, CAPILLARY
Glucose-Capillary: 143 mg/dL — ABNORMAL HIGH (ref 70–99)
Glucose-Capillary: 158 mg/dL — ABNORMAL HIGH (ref 70–99)
Glucose-Capillary: 163 mg/dL — ABNORMAL HIGH (ref 70–99)
Glucose-Capillary: 165 mg/dL — ABNORMAL HIGH (ref 70–99)
Glucose-Capillary: 208 mg/dL — ABNORMAL HIGH (ref 70–99)
Glucose-Capillary: 218 mg/dL — ABNORMAL HIGH (ref 70–99)

## 2019-07-10 LAB — LACTIC ACID, PLASMA: Lactic Acid, Venous: 1.6 mmol/L (ref 0.5–1.9)

## 2019-07-10 LAB — HEPARIN LEVEL (UNFRACTIONATED): Heparin Unfractionated: 0.34 IU/mL (ref 0.30–0.70)

## 2019-07-10 LAB — FUNGITELL, SERUM: Fungitell Result: <31 pg/mL (ref <80)

## 2019-07-10 LAB — PROCALCITONIN: Procalcitonin: 7.45 ng/mL

## 2019-07-10 LAB — FIBRIN DERIVATIVES D-DIMER (ARMC ONLY): Fibrin derivatives D-dimer (ARMC): 1248.64 ng/mL (FEU) — ABNORMAL HIGH (ref 0.00–499.00)

## 2019-07-10 LAB — MAGNESIUM: Magnesium: 2.6 mg/dL — ABNORMAL HIGH (ref 1.7–2.4)

## 2019-07-10 MED ORDER — LACTATED RINGERS IV BOLUS
500.0000 mL | Freq: Once | INTRAVENOUS | Status: AC
  Administered 2019-07-10: 500 mL via INTRAVENOUS

## 2019-07-10 MED ORDER — INSULIN DETEMIR 100 UNIT/ML ~~LOC~~ SOLN
25.0000 [IU] | Freq: Two times a day (BID) | SUBCUTANEOUS | Status: DC
  Administered 2019-07-10 – 2019-07-13 (×6): 25 [IU] via SUBCUTANEOUS
  Filled 2019-07-10 (×7): qty 0.25

## 2019-07-10 MED ORDER — CHLORHEXIDINE GLUCONATE CLOTH 2 % EX PADS
6.0000 | MEDICATED_PAD | Freq: Every day | CUTANEOUS | Status: AC
  Administered 2019-07-10 – 2019-07-14 (×5): 6 via TOPICAL

## 2019-07-10 MED ORDER — NOREPINEPHRINE 16 MG/250ML-% IV SOLN
0.0000 ug/min | INTRAVENOUS | Status: DC
  Administered 2019-07-10: 2 ug/min via INTRAVENOUS
  Filled 2019-07-10: qty 250

## 2019-07-10 MED ORDER — MUPIROCIN 2 % EX OINT
1.0000 "application " | TOPICAL_OINTMENT | Freq: Two times a day (BID) | CUTANEOUS | Status: AC
  Administered 2019-07-10 – 2019-07-14 (×10): 1 via NASAL
  Filled 2019-07-10: qty 22

## 2019-07-10 MED ORDER — HEPARIN SODIUM (PORCINE) 5000 UNIT/ML IJ SOLN
5000.0000 [IU] | Freq: Three times a day (TID) | INTRAMUSCULAR | Status: DC
  Administered 2019-07-10 – 2019-07-13 (×10): 5000 [IU] via SUBCUTANEOUS
  Filled 2019-07-10 (×10): qty 1

## 2019-07-10 MED ORDER — FAMOTIDINE 20 MG PO TABS: 20.0000 mg | ORAL_TABLET | Freq: Every day | ORAL | Status: DC

## 2019-07-10 MED ORDER — INSULIN ASPART 100 UNIT/ML ~~LOC~~ SOLN
0.0000 [IU] | SUBCUTANEOUS | Status: DC
  Administered 2019-07-10: 3 [IU] via SUBCUTANEOUS
  Administered 2019-07-10: 4 [IU] via SUBCUTANEOUS
  Administered 2019-07-10: 7 [IU] via SUBCUTANEOUS
  Administered 2019-07-10: 4 [IU] via SUBCUTANEOUS
  Administered 2019-07-11: 3 [IU] via SUBCUTANEOUS
  Administered 2019-07-11: 4 [IU] via SUBCUTANEOUS
  Administered 2019-07-11 – 2019-07-12 (×6): 3 [IU] via SUBCUTANEOUS
  Administered 2019-07-13: 4 [IU] via SUBCUTANEOUS
  Administered 2019-07-13: 7 [IU] via SUBCUTANEOUS
  Administered 2019-07-13: 3 [IU] via SUBCUTANEOUS
  Administered 2019-07-14: 11 [IU] via SUBCUTANEOUS
  Administered 2019-07-14: 08:00:00 4 [IU] via SUBCUTANEOUS
  Administered 2019-07-14: 7 [IU] via SUBCUTANEOUS
  Administered 2019-07-14 (×2): 4 [IU] via SUBCUTANEOUS
  Administered 2019-07-14: 11 [IU] via SUBCUTANEOUS
  Administered 2019-07-15 (×3): 4 [IU] via SUBCUTANEOUS
  Administered 2019-07-15 – 2019-07-16 (×2): 3 [IU] via SUBCUTANEOUS
  Administered 2019-07-16: 11 [IU] via SUBCUTANEOUS
  Administered 2019-07-16: 5 [IU] via SUBCUTANEOUS
  Administered 2019-07-16: 7 [IU] via SUBCUTANEOUS
  Administered 2019-07-16: 4 [IU] via SUBCUTANEOUS
  Administered 2019-07-17 (×2): 3 [IU] via SUBCUTANEOUS
  Administered 2019-07-17 (×2): 4 [IU] via SUBCUTANEOUS
  Filled 2019-07-10 (×35): qty 1

## 2019-07-10 NOTE — Progress Notes (Signed)
CRITICAL CARE NOTE  BRIEF PATIENT DESCRIPTION:  45F hx of DM2, HTN, RA, MDD, HFpEF,OSA, BMI >50, s/p COVID19 with severe ARDS, came in febrile hypoxemic with elevated lactate and CT chest abd showing poss pyelo and spenic infarct.MRSA BACTEREMIA   SIGNIFICANT EVENTS/STUDIES: 03/21: Pt admitted to ICU with hypotension secondary to septic shock  MRSA BACTEREMIA 03/21: CT Chest/Abd/Pelvisrevealed mixed ground-glass and consolidative opacity seen towards the lung apices bilaterally, poorly assessed given extensive respiratory motion artifact at the time of exam. Differential diagnosis could reflect residual or recurrent infection change in the setting of prior COVID-19. Extensive bilateral perinephric stranding and hazy retroperitoneal attenuation. The appearance is nonspecific, and can be seen with urinary tractinfection/pyelonephritis with minimal thickening of the bladder compatible with cystitis as well. Consider correlation with urinalysis. Questionable area of geographic hypoattenuation affecting the posterior spleen and periphery of the liver, may represent an area of developing splenic infarction. Marked laxity of the anterior abdominal wall with periumbilical postsurgical changes and a fat and simple fluid containing collection to the right of the umbilicus. Borderline enlarged left inguinal lymph nodes are similar to a comparison study 04/15/2013 and likely reactive. Aortic Atherosclerosis 3/22 remains on pressors, critically ill 3/22 family at bedside updated 3/23 remains septic shock, high risk for cardiac arrest and intubation Son at bedside updated    CC  Septic shock  SUBJECTIVE Patient remains critically ill Prognosis is guarded  Obtunded and intyermittant agitation on precedex   BP 119/62   Pulse (!) 54   Temp 98.3 F (36.8 C) (Oral)   Resp (!) 25   Ht 5\' 10"  (1.778 m)   Wt (!) 158.8 kg   LMP 04/20/2001   SpO2 95%   BMI 50.22 kg/m    I/O last 3  completed shifts: In: 2056.9 [I.V.:1806.9; IV Piggyback:250] Out: 1850 [Urine:1850] No intake/output data recorded.  SpO2: 95 % O2 Flow Rate (L/min): 3.5 L/min FiO2 (%): 40 %  Estimated body mass index is 50.22 kg/m as calculated from the following:   Height as of this encounter: 5\' 10"  (1.778 m).   Weight as of this encounter: 158.8 kg.  SIGNIFICANT EVENTS   REVIEW OF SYSTEMS  PATIENT IS UNABLE TO PROVIDE COMPLETE REVIEW OF SYSTEMS DUE TO SEVERE CRITICAL ILLNESS        PHYSICAL EXAMINATION:  GENERAL:critically ill appearing, +resp distress HEAD: Normocephalic, atraumatic.  EYES: Pupils equal, round, reactive to light.  No scleral icterus.  MOUTH: Moist mucosal membrane. NECK: Supple.  PULMONARY: +rhonchi, +wheezing CARDIOVASCULAR: S1 and S2. Regular rate and rhythm. No murmurs, rubs, or gallops.  GASTROINTESTINAL: Soft, nontender, -distended.  Positive bowel sounds.   MUSCULOSKELETAL: No swelling, clubbing, or edema.  NEUROLOGIC: obtunded SKIN:intact,warm,dry  MEDICATIONS: I have reviewed all medications and confirmed regimen as documented   CULTURE RESULTS   Recent Results (from the past 240 hour(s))  Urine culture     Status: None   Collection Time: 07/07/19 12:14 AM   Specimen: In/Out Cath Urine  Result Value Ref Range Status   Specimen Description   Final    IN/OUT CATH URINE Performed at [facility], [address], [place], [address] [place]    Special Requests   Final    NONE Performed at [facility], [address] [place], [address] [place]    Culture   Final    NO GROWTH Performed at [facility], [address], [place], [address] [place]    Report Status 07/09/2019 FINAL  Final  Blood  Culture (routine x 2)     Status: Abnormal   Collection Time: 07/08/19 12:14 AM   Specimen: BLOOD  Result Value Ref Range Status   Specimen Description   Final    BLOOD LEFT ANTECUBITAL Performed at [facility], [address], [place], [place] [address]    Special Requests   Final    BOTTLES DRAWN AEROBIC AND ANAEROBIC Blood Culture adequate volume Performed at [facility], [address] [place], [place] [address]    Culture  Setup Time   Final    GRAM POSITIVE COCCI IN BOTH AEROBIC AND ANAEROBIC BOTTLES CRITICAL RESULT CALLED TO, READ BACK BY AND VERIFIED WITH: ABBY ELLINGTON AT 8295 ON 07/08/19 SNG Performed at [facility], [address], [place], [place] [address]    Culture METHICILLIN RESISTANT STAPHYLOCOCCUS AUREUS (A)  Final   Report Status 07/10/2019 FINAL  Final   Organism ID, Bacteria METHICILLIN RESISTANT STAPHYLOCOCCUS AUREUS  Final      Susceptibility   Methicillin resistant staphylococcus aureus - MIC*    CIPROFLOXACIN >=8 RESISTANT Resistant     ERYTHROMYCIN >=8 RESISTANT Resistant     GENTAMICIN <=0.5 SENSITIVE Sensitive     OXACILLIN >=4 RESISTANT Resistant     TETRACYCLINE <=1 SENSITIVE Sensitive     VANCOMYCIN 1 SENSITIVE Sensitive     TRIMETH/SULFA <=10 SENSITIVE Sensitive     CLINDAMYCIN >=8 RESISTANT Resistant     RIFAMPIN <=0.5 SENSITIVE Sensitive     Inducible Clindamycin NEGATIVE Sensitive     * METHICILLIN RESISTANT STAPHYLOCOCCUS AUREUS  Blood Culture (routine x 2)     Status: Abnormal   Collection Time: 07/08/19 12:14 AM   Specimen: BLOOD  Result Value Ref Range Status   Specimen Description   Final    BLOOD RIGHT ANTECUBITAL Performed at [facility], [address] [place], [place] [address]    Special Requests   Final    BOTTLES DRAWN AEROBIC AND ANAEROBIC Blood Culture results may not be optimal due to an excessive volume of blood received in culture bottles Performed at [facility], [address] [place], [place] [address]    Culture  Setup Time   Final    GRAM POSITIVE COCCI IN BOTH AEROBIC AND ANAEROBIC BOTTLES CRITICAL VALUE NOTED.  VALUE IS CONSISTENT WITH PREVIOUSLY REPORTED AND CALLED VALUE. Performed  at [facility], [address] [place], [place] [address]    Culture (A)  Final    STAPHYLOCOCCUS AUREUS SUSCEPTIBILITIES PERFORMED ON PREVIOUS CULTURE WITHIN THE LAST 5 DAYS. Performed at [facility], [address] [place], [place] [address]    Report Status 07/10/2019 FINAL  Final  Blood Culture ID Panel (Reflexed)     Status: Abnormal   Collection Time: 07/08/19 12:14 AM  Result Value Ref Range Status   Enterococcus species NOT DETECTED NOT DETECTED Final   Listeria monocytogenes NOT DETECTED NOT DETECTED Final   Staphylococcus species DETECTED (A) NOT DETECTED Final    Comment: CRITICAL RESULT CALLED TO, READ BACK BY AND VERIFIED WITH: CHARLES SHANLEVER AT 1136 ON 07/08/19 SNG    Staphylococcus aureus (BCID) DETECTED (A) NOT DETECTED Final    Comment: Methicillin (oxacillin)-resistant Staphylococcus aureus (MRSA). MRSA is predictably resistant to beta-lactam antibiotics (except ceftaroline). Preferred therapy is vancomycin unless clinically contraindicated. Patient requires contact precautions if  hospitalized. CRITICAL RESULT CALLED TO, READ BACK BY AND VERIFIED WITH: CHARLES SHANLEVER AT 1136 ON 07/08/19 SNG    Methicillin resistance DETECTED (  A) NOT DETECTED Final    Comment: CRITICAL RESULT CALLED TO, READ BACK BY AND VERIFIED WITH: CHARLES SHANLEVER AT 5366 ON 07/08/19 SNG    Streptococcus species NOT DETECTED NOT DETECTED Final   Streptococcus agalactiae NOT DETECTED NOT DETECTED Final   Streptococcus pneumoniae NOT DETECTED NOT DETECTED Final   Streptococcus pyogenes NOT DETECTED NOT DETECTED Final   Acinetobacter baumannii NOT DETECTED NOT DETECTED Final   Enterobacteriaceae species NOT DETECTED NOT DETECTED Final   Enterobacter cloacae complex NOT DETECTED NOT DETECTED Final   Escherichia coli NOT DETECTED NOT DETECTED Final   Klebsiella oxytoca NOT DETECTED NOT DETECTED Final   Klebsiella pneumoniae NOT DETECTED NOT DETECTED Final   Proteus species  NOT DETECTED NOT DETECTED Final   Serratia marcescens NOT DETECTED NOT DETECTED Final   Haemophilus influenzae NOT DETECTED NOT DETECTED Final   Neisseria meningitidis NOT DETECTED NOT DETECTED Final   Pseudomonas aeruginosa NOT DETECTED NOT DETECTED Final   Candida albicans NOT DETECTED NOT DETECTED Final   Candida glabrata NOT DETECTED NOT DETECTED Final   Candida krusei NOT DETECTED NOT DETECTED Final   Candida parapsilosis NOT DETECTED NOT DETECTED Final   Candida tropicalis NOT DETECTED NOT DETECTED Final    Comment: Performed at [facility], [address] [place], [place] [address]  Respiratory Panel by RT PCR (Flu A&B, Covid) - Nasopharyngeal Swab     Status: None   Collection Time: 07/08/19  1:18 AM   Specimen: Nasopharyngeal Swab  Result Value Ref Range Status   SARS Coronavirus 2 by RT PCR NEGATIVE NEGATIVE Final    Comment: (NOTE) SARS-CoV-2 target nucleic acids are NOT DETECTED. The SARS-CoV-2 RNA is generally detectable in upper respiratoy specimens during the acute phase of infection. The lowest concentration of SARS-CoV-2 viral copies this assay can detect is 131 copies/mL. A negative result does not preclude SARS-Cov-2 infection and should not be used as the sole basis for treatment or other patient management decisions. A negative result may occur with  improper specimen collection/handling, submission of specimen other than nasopharyngeal swab, presence of viral mutation(s) within the areas targeted by this assay, and inadequate number of viral copies (<131 copies/mL). A negative result must be combined with clinical observations, patient history, and epidemiological information. The expected result is Negative. Fact Sheet for Patients:  PinkCheek.be Fact Sheet for Healthcare Providers:  GravelBags.it This test is not yet ap proved or cleared by the [place] FDA and  has been authorized for  detection and/or diagnosis of SARS-CoV-2 by FDA under an Emergency Use Authorization (EUA). This EUA will remain  in effect (meaning this test can be used) for the duration of the COVID-19 declaration under Section 564(b)(1) of the Act, 21 U.S.C. section 360bbb-3(b)(1), unless the authorization is terminated or revoked sooner.    Influenza A by PCR NEGATIVE NEGATIVE Final   Influenza B by PCR NEGATIVE NEGATIVE Final    Comment: (NOTE) The Xpert Xpress SARS-CoV-2/FLU/RSV assay is intended as an aid in  the diagnosis of influenza from Nasopharyngeal swab specimens and  should not be used as a sole basis for treatment. Nasal washings and  aspirates are unacceptable for Xpert Xpress SARS-CoV-2/FLU/RSV  testing. Fact Sheet for Patients: PinkCheek.be Fact Sheet for Healthcare Providers: GravelBags.it This test is not yet approved or cleared by the [place] FDA and  has been authorized for detection and/or diagnosis of SARS-CoV-2 by  FDA under an Emergency Use Authorization (EUA). This EUA will remain  in effect (meaning this  test can be used) for the duration of the  Covid-19 declaration under Section 564(b)(1) of the Act, 21  U.S.C. section 360bbb-3(b)(1), unless the authorization is  terminated or revoked. Performed at [facility], [address] [place], [place] [address]   MRSA PCR Screening     Status: Abnormal   Collection Time: 07/08/19  7:51 AM   Specimen: Nasal Mucosa; Nasopharyngeal  Result Value Ref Range Status   MRSA by PCR POSITIVE (A) NEGATIVE Final    Comment:        The GeneXpert MRSA Assay (FDA approved for NASAL specimens only), is one component of a comprehensive MRSA colonization surveillance program. It is not intended to diagnose MRSA infection nor to guide or monitor treatment for MRSA infections. RESULT CALLED TO, READ BACK BY AND VERIFIED WITH: VICK GREGORY AT 1305  07/08/19.PMF Performed at [facility], [address] [place], [place] [address]   Culture, blood (Routine X 2) w Reflex to ID Panel     Status: None (Preliminary result)   Collection Time: 07/09/19  2:02 PM   Specimen: BLOOD  Result Value Ref Range Status   Specimen Description BLOOD BLOOD RIGHT HAND  Final   Special Requests   Final    BOTTLES DRAWN AEROBIC AND ANAEROBIC Blood Culture adequate volume   Culture   Final    NO GROWTH < 24 HOURS Performed at [facility], [address], [place], [place] [address]    Report Status PENDING  Incomplete  Culture, blood (Routine X 2) w Reflex to ID Panel     Status: None (Preliminary result)   Collection Time: 07/09/19  3:57 PM   Specimen: BLOOD  Result Value Ref Range Status   Specimen Description BLOOD BLOOD RIGHT ARM  Final   Special Requests   Final    BOTTLES DRAWN AEROBIC ONLY Blood Culture adequate volume   Culture   Final    NO GROWTH < 24 HOURS Performed at [facility], [address] [place], [place] [address]    Report Status PENDING  Incomplete        BMP Latest Ref Rng & Units 07/10/2019 07/09/2019 07/08/2019  Glucose 70 - 99 mg/dL 664(Q) 034(V) 425(Z)  BUN 6 - 20 mg/dL 56(L) 87(F) 64(P)  Creatinine 0.44 - 1.00 mg/dL 3.29(J) 1.88(C) 1.66(A)  BUN/Creat Ratio 9 - 23 - - -  Sodium 135 - 145 mmol/L 138 135 134(L)  Potassium 3.5 - 5.1 mmol/L 3.8 3.7 4.0  Chloride 98 - 111 mmol/L 107 104 101  CO2 22 - 32 mmol/L 23 23 23   Calcium 8.9 - 10.3 mg/dL 8.0(L) 7.8(L) 7.9(L)      Indwelling Urinary Catheter continued, requirement due to   Reason to continue Indwelling Urinary Catheter strict Intake/Output monitoring for hemodynamic instability   Central Line/ continued, requirement due to  Reason to continue Monitoring of central venous pressure or other hemodynamic parameters and poor IV access      ASSESSMENT AND PLAN SYNOPSIS  SHOCK-SEPSIS due to MRSA BACTEREMIA ?source  complicated by acute toxic metabolic encephalopathy due to septic shock and renal failure -use vasopressors to keep MAP>65 -follow ABG and LA -follow up cultures -stress dose steroids -aggressive IV fluid resuscitation  POST COVID PNEUMONIA with sequale of ILD Oxygen as needed High risk for intubation  ACUTE KIDNEY INJURY/Renal Failure -follow chem 7 -follow UO -continue Foley Catheter-assess need -Avoid nephrotoxic agents -Recheck creatinine    NEUROLOGY delirium and encephalopathy from sepsis  On precedex High risk for intubation    CARDIAC ICU monitoring  ID -continue IV abx as prescibed -follow up cultures  GI GI PROPHYLAXIS as indicated  NUTRITIONAL STATUS Nutrition Status:    DIET-->NPO Constipation protocol as indicated  ENDO - will use ICU hypoglycemic\Hyperglycemia protocol if indicated   ELECTROLYTES -follow labs as needed -replace as needed -pharmacy consultation and following   DVT/GI PRX ordered TRANSFUSIONS AS NEEDED MONITOR FSBS ASSESS the need for LABS as needed   Critical Care Time devoted to patient care services described in this note is 35 minutes.   Overall, patient is critically ill, prognosis is guarded.  Patient with Multiorgan failure and at high risk for cardiac arrest and death.    Lucie Leather, M.D.  [facility]  Medical Director [facility] Medical Director [facility] Cardio-Pulmonary Department

## 2019-07-10 NOTE — Progress Notes (Signed)
PHARMACY CONSULT NOTE  Pharmacy Consult for Electrolyte Monitoring and Replacement   Recent Labs: Potassium (mmol/L)  Date Value  07/10/2019 3.8  05/27/2014 3.8   Magnesium (mg/dL)  Date Value  16/01/9603 2.6 (H)  12/02/2013 1.1 (L)   Calcium (mg/dL)  Date Value  54/12/8117 8.0 (L)   Calcium, Total (mg/dL)  Date Value  14/78/2956 8.5   Albumin (g/dL)  Date Value  21/30/8657 2.6 (L)  11/16/2018 4.2  12/03/2013 2.7 (L)   Phosphorus (mg/dL)  Date Value  84/69/6295 4.0   Sodium (mmol/L)  Date Value  07/10/2019 138  11/16/2018 134  05/27/2014 136     Assessment: 59 year old female admitted with difficulty breathing. Patient recently hospitalized 04/06/19 to 05/04/19 post-COVID. Pharmacy consulted for management of electrolytes and glucose control.  Goal of Therapy:  Electrolytes WNL Glucose < 180 (ideally)  Plan:  Electrolytes:  -No repletion indicated today -Scr 0.90 >> 1.39 >> 1.26 >> 1.15 -LBM 3/19 -Follow-up AM labs tomorrow   Glucose (3/21 Hgb A1C 11.6%):  -Un-controlled but improving since switch to levemir -On stress dose steroids -Poor PO intake -Increase levemir to 25 units BID per diabetes coordinator recommendations -Continue SSI -On Tresiba and linagliptin PTA   Tressie Ellis  Pharmacy Resident 07/10/2019 8:03 AM

## 2019-07-10 NOTE — Progress Notes (Signed)
Inpatient Diabetes Program Recommendations  AACE/ADA: New Consensus Statement on Inpatient Glycemic Control   Target Ranges:  Prepandial:   less than 140 mg/dL      Peak postprandial:   less than 180 mg/dL (1-2 hours)      Critically ill patients:  140 - 180 mg/dL   Results for RIDA, LOUDIN (MRN [id number]) as of 07/10/2019 10:24  Ref. Range 07/09/2019 07:20 07/09/2019 11:33 07/09/2019 16:29 07/09/2019 19:41 07/09/2019 21:23 07/09/2019 22:25 07/10/2019 07:37  Glucose-Capillary Latest Ref Range: 70 - 99 mg/dL 021 (H) 117 (H)  Novolog 20 units  Levemir 20 units 351 (H)  Novolog 20 units 303 (H) 277 (H)  Novolog 3 units  Levemir 20 untis 292 (H) 218 (H)  Novolog 7 units   Review of Glycemic Control  Diabetes history: DM2 Outpatient Diabetes medications: Tresiba 65-100 units daily, Tradjenta 5 mg daily Current orders for Inpatient glycemic control: Levemir 20 unitsBID, Novolog 0-20 units Q4H; Solucortef 50 mg Q6H  Inpatient Diabetes Program Recommendations:   Insulin-Basal: Please consider increasing Levemir to 25 units BID.  Thanks, Orlando Penner, RN, MSN, CDE Diabetes Coordinator Inpatient Diabetes Program [phone] (Team Pager from 8am to 5pm)

## 2019-07-10 NOTE — Progress Notes (Signed)
ANTICOAGULATION CONSULT NOTE - Initial Consult  Pharmacy Consult for Heparin Indication: VTE prophylaxis / R/O PE  Allergies  Allergen Reactions  . Cephalexin Hives  . Codeine Palpitations, Nausea Only, Nausea And Vomiting, Rash and Shortness Of Breath    "makes heart fly, she gets flushed and passes out"  . Doxycycline Rash  . Propoxyphene Rash and Shortness Of Breath    Increase heart rate  . Sulfa Antibiotics Palpitations, Nausea Only, Shortness Of Breath and Hives    "makes heart fly, she gets flushed and passes out"  . Lovenox [Enoxaparin Sodium] Hives  . Hydrocodone Nausea And Vomiting    Hear racing & breaks out into a cold sweat.  . Meropenem Rash    Erythematous, hot, pruritic rash over arms, chest, back, abdomen, and face occurred at the end of meropenem infusion on 02/22/18    Patient Measurements: Height: 5\' 10"  (177.8 cm) Weight: (!) 350 lb (158.8 kg) IBW/kg (Calculated) : 68.5 HEPARIN DW (KG): 107.6  Vital Signs: Temp: 98.3 F (36.8 C) (03/23 0400) Temp Source: Oral (03/23 0400) BP: 111/63 (03/23 0400) Pulse Rate: 63 (03/23 0400)  Labs: Recent Labs    07/08/19 0014 07/08/19 0014 07/08/19 0702 07/08/19 2131 07/09/19 0420 07/09/19 0500 07/09/19 1246 07/09/19 2037 07/10/19 0412  HGB 11.8*   < >  --   --  9.7*  --   --   --  9.4*  HCT 38.4  --   --   --  31.5*  --   --   --  31.3*  PLT 314  --   --   --  212  --   --   --  191  APTT  --   --  91*  --   --   --   --   --   --   LABPROT 15.6*  --   --   --   --   --   --   --   --   INR 1.3*  --   --   --   --   --   --   --   --   HEPARINUNFRC  --   --   --   --   --   --  0.39 0.40 0.34  CREATININE 0.90   < >  --  1.39* 1.26*  --   --   --  1.15*  TROPONINIHS  --   --  26*  --   --  24*  --   --   --    < > = values in this interval not displayed.    Estimated Creatinine Clearance: 89.1 mL/min (A) (by C-G formula based on SCr of 1.15 mg/dL (H)).   Medical History: Past Medical History:   Diagnosis Date  . Abdominal wall hernia 01/29/2013  . Anxiety   . Arthritis    Rheumatoid  . C. difficile colitis   . Chronic diastolic heart failure ([place])   . COVID-19 03/23/2019   Diagnosed at [facility] (send-out) on 03/23/2019  . Depression   . Diabetes mellitus    states no meds or diet restrictions  at present  . Diastolic CHF ([place])   . Esophagitis   . Fluid retention   . GERD (gastroesophageal reflux disease)   . Hiatal hernia   . Hypertension   . Hypokalemia due to loss of potassium 10/21/2015   Overview:  Associated with 3 weeks of diarrhea  And QT prolongation.  . Hypothyroidism   .  IBS (irritable bowel syndrome)   . Moderate episode of recurrent major depressive disorder (HCC) 06/03/2004  . Morbid obesity (HCC)   . MRSA (methicillin resistant Staphylococcus aureus) infection 11/2017   left inner thigh abcess  . Neurogenic bladder    has pacemaker  . Neuropathy   . Obesity   . Panic attacks   . Pneumonia due to COVID-19 virus   . Rheumatoid arthritis (HCC)   . Sleep apnea    STATES SEVERE, CANT TOLERATE MASK- LAST STUDY YEARS AGO    Medications:  Medications Prior to Admission  Medication Sig Dispense Refill Last Dose  . ALPRAZolam (XANAX) 0.5 MG tablet Take 1 tablet (0.5 mg total) by mouth 2 (two) times daily as needed for anxiety. 10 tablet 0   . ARIPiprazole (ABILIFY) 5 MG tablet TAKE 1 TABLET BY MOUTH ONCE DAILY. (Patient taking differently: Take 5 mg by mouth daily. ) 28 tablet 11   . ascorbic acid (VITAMIN C) 500 MG tablet Take 1 tablet (500 mg total) by mouth daily. 30 tablet 0   . atorvastatin (LIPITOR) 80 MG tablet Take 80 mg by mouth daily at 6 PM.      . benzonatate (TESSALON PERLES) 100 MG capsule Take 1 capsule (100 mg total) by mouth 3 (three) times daily as needed for cough. 30 capsule 0   . budesonide (PULMICORT) 0.5 MG/2ML nebulizer solution Take 2 mLs (0.5 mg total) by nebulization 2 (two) times daily. 120 mL 11   . busPIRone (BUSPAR) 10 MG tablet TAKE  1 TABLET BY MOUTH TWICE DAILY (Patient taking differently: Take 10 mg by mouth 2 (two) times daily. ) 56 tablet 11   . Calcium Carb-Ergocalciferol 250-125 MG-UNIT TABS Take 1 tablet by mouth daily.      . Cholecalciferol (VITAMIN D3) 125 MCG (5000 UT) CAPS Take 1 capsule (5,000 Units total) by mouth daily with breakfast. Take along with calcium and magnesium. 90 capsule 3   . ciclesonide (ALVESCO) 160 MCG/ACT inhaler Inhale 4 puffs into the lungs 2 (two) times daily. 1 Inhaler 0   . dexamethasone (DECADRON) 1 MG tablet Take 2 mg daily for 3days,Take 1mg  daily for 3 days, Take 1mg  every other day for 3days,then stop 12 tablet 0   . Ensure Max Protein (ENSURE MAX PROTEIN) LIQD Take 330 mLs (11 oz total) by mouth 2 (two) times daily between meals. 10000 mL 0   . famotidine (PEPCID) 20 MG tablet Take 1 tablet (20 mg total) by mouth daily. 30 tablet 0   . FLUoxetine (PROZAC) 20 MG capsule Take 1 capsule (20 mg total) by mouth daily. TAKE (1) CAPSULE BY MOUTH ONCE DAILY. 30 capsule 6   . folic acid (FOLVITE) 1 MG tablet Take 1 mg by mouth daily.      . furosemide (LASIX) 20 MG tablet Take 1 tablet (20 mg total) by mouth daily as needed for fluid or edema (weight gain >3 lbs in 1 day or >5 lbs in 2 days). 30 tablet 0   . Insulin Degludec (TRESIBA FLEXTOUCH) 200 UNIT/ML SOPN Inject 65-100 Units into the skin daily.     ipratropium-albuterol (DUONEB) 0.5-2.5 (3) MG/3ML SOLN Take 3 mLs by nebulization 3 (three) times daily. 360 mL 0   . levothyroxine (SYNTHROID, LEVOTHROID) 88 MCG tablet Take 88 mcg by mouth daily before breakfast.     . linagliptin (TRADJENTA) 5 MG TABS tablet Take 1 tablet (5 mg total) by mouth daily. 30 tablet 0   . lisinopril (PRINIVIL,ZESTRIL)  2.5 MG tablet Take 2.5 mg by mouth daily.     Marland Kitchen oxyCODONE (OXY IR/ROXICODONE) 5 MG immediate release tablet Take 1 tablet (5 mg total) by mouth every 6 (six) hours as needed for severe pain. Must last 30 days 120 tablet 0   . polyethylene glycol  (MIRALAX / GLYCOLAX) 17 g packet Take 17 g by mouth daily as needed for mild constipation. 14 each 0   . pramipexole (MIRAPEX) 0.125 MG tablet Take 0.125 mg by mouth 2 (two) times daily.     . pregabalin (LYRICA) 150 MG capsule Take 1 capsule (150 mg total) by mouth 3 (three) times daily. 90 capsule 5   . QUEtiapine (SEROQUEL) 300 MG tablet TAKE ONE TABLET BY MOUTH AT BEDTIME. (Patient taking differently: Take 300 mg by mouth at bedtime. TAKE ONE TABLET BY MOUTH AT BEDTIME.) 28 tablet 11   . VESICARE 5 MG tablet Take 5 mg by mouth daily.     Marland Kitchen zinc sulfate 220 (50 Zn) MG capsule Take 1 capsule (220 mg total) by mouth daily. 30 capsule 0   . zolpidem (AMBIEN) 5 MG tablet Take 1 tablet (5 mg total) by mouth at bedtime. TAKE 1 TABLET BY MOUTH EVERYDAY AT BEDTIME 30 tablet 5     Assessment: CT was negative for PE. Heparin was restarted yesterday. Lovenox d/c'd, last dose was last night (3/21).  H/H trending down, PLTs trending down. Possibly attributable to hemodilution.    Date Time HL 3/22 1246 0.39  3/22     2037    0.40  Goal of Therapy:  Heparin level 0.3-0.7 units/ml Monitor platelets by anticoagulation protocol: Yes   Plan:  03/23 @ 0400 HL 0.34 therapeutic. Will continue current rate and will recheck HL w/ am labs and continue to monitor.  Thomasene Ripple, PharmD, BCPS Clinical Pharmacist 07/10/2019,5:27 AM

## 2019-07-10 NOTE — Progress Notes (Signed)
[facility] CLINIC INFECTIOUS DISEASE PROGRESS NOTE Date of Admission:  07/07/2019     ID: [name] [name] is a 59 y.o. female with  MRSA bacteremia Principal Problem:   Septic shock (HCC) Active Problems:   COPD (chronic obstructive pulmonary disease) (HCC)   Essential (primary) hypertension   Chronic diastolic CHF (congestive heart failure) (HCC)   Rheumatoid arthritis (HCC)   Chronic pain syndrome   Hypothyroidism   Acute metabolic encephalopathy   Hyperglycemia due to type 2 diabetes mellitus (HCC)   Morbid obesity with BMI of 50.0-59.9, adult (HCC)   History of COVID-19   Severe sepsis with septic shock (HCC)   Subjective: No fevers, FU bcx ngtd from 3/22. Not on pressors. HD stable. Still lethargic, on precedex.  ROS  Eleven systems are reviewed and negative except per hpi  Medications:  Antibiotics Given (last 72 hours)    Date/Time Action Medication Dose Rate   07/08/19 0035 New Bag/Given   aztreonam (AZACTAM) 2 g in sodium chloride 0.9 % 100 mL IVPB 2 g 200 mL/hr   07/08/19 0137 New Bag/Given   metroNIDAZOLE (FLAGYL) IVPB 500 mg 500 mg 100 mL/hr   07/08/19 0257 New Bag/Given   vancomycin (VANCOCIN) IVPB 1000 mg/200 mL premix 1,000 mg 200 mL/hr   07/08/19 0530 New Bag/Given   vancomycin (VANCOCIN) IVPB 1000 mg/200 mL premix 1,000 mg 200 mL/hr   07/08/19 0741 New Bag/Given   aztreonam (AZACTAM) 2 g in sodium chloride 0.9 % 100 mL IVPB 2 g 200 mL/hr   07/08/19 1258 New Bag/Given   ciprofloxacin (CIPRO) IVPB 400 mg 400 mg 200 mL/hr   07/08/19 1659 New Bag/Given   vancomycin (VANCOREADY) IVPB 1250 mg/250 mL 1,250 mg 166.7 mL/hr   07/09/19 0017 New Bag/Given   ciprofloxacin (CIPRO) IVPB 400 mg 400 mg 200 mL/hr   07/09/19 0516 New Bag/Given   vancomycin (VANCOREADY) IVPB 1250 mg/250 mL 1,250 mg 166.7 mL/hr   07/09/19 1723 New Bag/Given   vancomycin (VANCOREADY) IVPB 1250 mg/250 mL 1,250 mg 166.7 mL/hr   07/10/19 0617 New Bag/Given   vancomycin (VANCOREADY) IVPB 1250  mg/250 mL 1,250 mg 166.7 mL/hr     . busPIRone  10 mg Oral BID  . Chlorhexidine Gluconate Cloth  6 each Topical Daily  . hydrocortisone sod succinate (SOLU-CORTEF) inj  50 mg Intravenous Q6H  . insulin aspart  0-20 Units Subcutaneous TID WC  . insulin aspart  0-5 Units Subcutaneous QHS  . insulin detemir  20 Units Subcutaneous BID  . levothyroxine  88 mcg Oral QAC breakfast  . pramipexole  0.125 mg Oral BID    Objective: Vital signs in last 24 hours: Temp:  [97.8 F (36.6 C)-98.3 F (36.8 C)] 98.3 F (36.8 C) (03/23 0400) Pulse Rate:  [50-152] 54 (03/23 0600) Resp:  [16-27] 25 (03/23 0600) BP: (80-137)/(43-83) 119/62 (03/23 0600) SpO2:  [88 %-100 %] 95 % (03/23 0600) Constitutional:  Morbidly obese, sedated. On O2 HENT: /AT, PERRLA, no scleral icterus Mouth/Throat: Oropharynx is clear and moist. No oropharyngeal exudate.  Cardiovascular: Normal rate, regular rhythm and normal heart sounds.  Pulmonary/Chest: Effort normal and breath sounds normal. No respiratory distress.  has no wheezes.  Neck = supple, no nuchal rigidity Abdominal: massive pannusSoft. Bowel sounds are normal.  exhibits no distension. There is no tenderness.  Lymphadenopathy: no cervical adenopathy. No axillary adenopathy Neurological: on sedation  Skin: Skin is warm and dry. No rash noted. No erythema. 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   Psychiatric:  sedated Line L neck TLC wnl  Lab Results Recent Labs    07/09/19 0420 07/10/19 0412  WBC 21.7* 18.6*  HGB 9.7* 9.4*  HCT 31.5* 31.3*  NA 135 138  K 3.7 3.8  CL 104 107  CO2 23 23  BUN 35* 47*  CREATININE 1.26* 1.15*    Microbiology: Results for orders placed or performed during the hospital encounter of 07/07/19  Urine culture     Status: None   Collection Time: 07/07/19 12:14 AM   Specimen: In/Out Cath Urine  Result Value Ref Range Status   Specimen Description   Final    IN/OUT CATH URINE Performed at [facility], [address] [place], [place] [address]    Special Requests   Final    NONE Performed at [facility], [address], [place], [place] [address]    Culture   Final    NO GROWTH Performed at [facility], [place] [address], [place], [place] [address]    Report Status 07/09/2019 FINAL  Final  Blood Culture (routine x 2)     Status: Abnormal   Collection Time: 07/08/19 12:14 AM   Specimen: BLOOD  Result Value Ref Range Status   Specimen Description   Final    BLOOD LEFT ANTECUBITAL Performed at [facility], [address], [place], [place] [address]    Special Requests   Final    BOTTLES DRAWN AEROBIC AND ANAEROBIC Blood Culture adequate volume Performed at [facility], [address] [place], [place] [address]    Culture  Setup Time   Final    GRAM POSITIVE COCCI IN BOTH AEROBIC AND ANAEROBIC BOTTLES CRITICAL RESULT CALLED TO, READ BACK BY AND VERIFIED WITH: ABBY ELLINGTON AT 4627 ON 07/08/19 SNG Performed at [facility], [place] [address] [place], [place] [address]    Culture METHICILLIN RESISTANT STAPHYLOCOCCUS AUREUS (A)  Final   Report Status 07/10/2019 FINAL  Final   Organism ID, Bacteria METHICILLIN RESISTANT STAPHYLOCOCCUS AUREUS  Final      Susceptibility   Methicillin resistant staphylococcus aureus - MIC*    CIPROFLOXACIN >=8 RESISTANT Resistant     ERYTHROMYCIN >=8 RESISTANT Resistant     GENTAMICIN <=0.5 SENSITIVE Sensitive     OXACILLIN >=4 RESISTANT Resistant     TETRACYCLINE <=1 SENSITIVE Sensitive     VANCOMYCIN 1 SENSITIVE Sensitive     TRIMETH/SULFA <=10 SENSITIVE Sensitive     CLINDAMYCIN >=8 RESISTANT Resistant     RIFAMPIN <=0.5 SENSITIVE Sensitive     Inducible Clindamycin NEGATIVE Sensitive     * METHICILLIN RESISTANT STAPHYLOCOCCUS AUREUS  Blood Culture (routine x 2)     Status: Abnormal   Collection Time: 07/08/19 12:14 AM   Specimen: BLOOD  Result Value Ref Range Status   Specimen Description   Final     BLOOD RIGHT ANTECUBITAL Performed at [facility] [address] [place], [place] [address]    Special Requests   Final    BOTTLES DRAWN AEROBIC AND ANAEROBIC Blood Culture results may not be optimal due to an excessive volume of blood received in culture bottles Performed at [facility], [address] [place], [place] [address]    Culture  Setup Time   Final    GRAM POSITIVE COCCI IN BOTH AEROBIC AND ANAEROBIC BOTTLES CRITICAL VALUE NOTED.  VALUE IS CONSISTENT WITH PREVIOUSLY REPORTED AND CALLED VALUE. Performed at [facility], [address], [place], [place] [address]  Culture (A)  Final    STAPHYLOCOCCUS AUREUS SUSCEPTIBILITIES PERFORMED ON PREVIOUS CULTURE WITHIN THE LAST 5 DAYS. Performed at [facility], [address], [place], [place] [address]    Report Status 07/10/2019 FINAL  Final  Blood Culture ID Panel (Reflexed)     Status: Abnormal   Collection Time: 07/08/19 12:14 AM  Result Value Ref Range Status   Enterococcus species NOT DETECTED NOT DETECTED Final   Listeria monocytogenes NOT DETECTED NOT DETECTED Final   Staphylococcus species DETECTED (A) NOT DETECTED Final    Comment: CRITICAL RESULT CALLED TO, READ BACK BY AND VERIFIED WITH: CHARLES SHANLEVER AT 1136 ON 07/08/19 SNG    Staphylococcus aureus (BCID) DETECTED (A) NOT DETECTED Final    Comment: Methicillin (oxacillin)-resistant Staphylococcus aureus (MRSA). MRSA is predictably resistant to beta-lactam antibiotics (except ceftaroline). Preferred therapy is vancomycin unless clinically contraindicated. Patient requires contact precautions if  hospitalized. CRITICAL RESULT CALLED TO, READ BACK BY AND VERIFIED WITH: CHARLES SHANLEVER AT 1136 ON 07/08/19 SNG    Methicillin resistance DETECTED (A) NOT DETECTED Final    Comment: CRITICAL RESULT CALLED TO, READ BACK BY AND VERIFIED WITH: CHARLES SHANLEVER AT 1136 ON 07/08/19 SNG    Streptococcus species NOT DETECTED NOT  DETECTED Final   Streptococcus agalactiae NOT DETECTED NOT DETECTED Final   Streptococcus pneumoniae NOT DETECTED NOT DETECTED Final   Streptococcus pyogenes NOT DETECTED NOT DETECTED Final   Acinetobacter baumannii NOT DETECTED NOT DETECTED Final   Enterobacteriaceae species NOT DETECTED NOT DETECTED Final   Enterobacter cloacae complex NOT DETECTED NOT DETECTED Final   Escherichia coli NOT DETECTED NOT DETECTED Final   Klebsiella oxytoca NOT DETECTED NOT DETECTED Final   Klebsiella pneumoniae NOT DETECTED NOT DETECTED Final   Proteus species NOT DETECTED NOT DETECTED Final   Serratia marcescens NOT DETECTED NOT DETECTED Final   Haemophilus influenzae NOT DETECTED NOT DETECTED Final   Neisseria meningitidis NOT DETECTED NOT DETECTED Final   Pseudomonas aeruginosa NOT DETECTED NOT DETECTED Final   Candida albicans NOT DETECTED NOT DETECTED Final   Candida glabrata NOT DETECTED NOT DETECTED Final   Candida krusei NOT DETECTED NOT DETECTED Final   Candida parapsilosis NOT DETECTED NOT DETECTED Final   Candida tropicalis NOT DETECTED NOT DETECTED Final    Comment: Performed at [facility], [address] [place], [place] [address]  Respiratory Panel by RT PCR (Flu A&B, Covid) - Nasopharyngeal Swab     Status: None   Collection Time: 07/08/19  1:18 AM   Specimen: Nasopharyngeal Swab  Result Value Ref Range Status   SARS Coronavirus 2 by RT PCR NEGATIVE NEGATIVE Final    Comment: (NOTE) SARS-CoV-2 target nucleic acids are NOT DETECTED. The SARS-CoV-2 RNA is generally detectable in upper respiratoy specimens during the acute phase of infection. The lowest concentration of SARS-CoV-2 viral copies this assay can detect is 131 copies/mL. A negative result does not preclude SARS-Cov-2 infection and should not be used as the sole basis for treatment or other patient management decisions. A negative result may occur with  improper specimen collection/handling, submission of specimen  other than nasopharyngeal swab, presence of viral mutation(s) within the areas targeted by this assay, and inadequate number of viral copies (<131 copies/mL). A negative result must be combined with clinical observations, patient history, and epidemiological information. The expected result is Negative. Fact Sheet for Patients:  https://www.moore.com/ Fact Sheet for Healthcare Providers:  https://www.young.biz/ This test is not yet ap proved or cleared by the [place]  FDA and  has been authorized for detection and/or diagnosis of SARS-CoV-2 by FDA under an Emergency Use Authorization (EUA). This EUA will remain  in effect (meaning this test can be used) for the duration of the COVID-19 declaration under Section 564(b)(1) of the Act, 21 U.S.C. section 360bbb-3(b)(1), unless the authorization is terminated or revoked sooner.    Influenza A by PCR NEGATIVE NEGATIVE Final   Influenza B by PCR NEGATIVE NEGATIVE Final    Comment: (NOTE) The Xpert Xpress SARS-CoV-2/FLU/RSV assay is intended as an aid in  the diagnosis of influenza from Nasopharyngeal swab specimens and  should not be used as a sole basis for treatment. Nasal washings and  aspirates are unacceptable for Xpert Xpress SARS-CoV-2/FLU/RSV  testing. Fact Sheet for Patients: https://www.moore.com/ Fact Sheet for Healthcare Providers: https://www.young.biz/ This test is not yet approved or cleared by the [place] FDA and  has been authorized for detection and/or diagnosis of SARS-CoV-2 by  FDA under an Emergency Use Authorization (EUA). This EUA will remain  in effect (meaning this test can be used) for the duration of the  Covid-19 declaration under Section 564(b)(1) of the Act, 21  U.S.C. section 360bbb-3(b)(1), unless the authorization is  terminated or revoked. Performed at [facility], [address] [place], [place]  [address]   MRSA PCR Screening     Status: Abnormal   Collection Time: 07/08/19  7:51 AM   Specimen: Nasal Mucosa; Nasopharyngeal  Result Value Ref Range Status   MRSA by PCR POSITIVE (A) NEGATIVE Final    Comment:        The GeneXpert MRSA Assay (FDA approved for NASAL specimens only), is one component of a comprehensive MRSA colonization surveillance program. It is not intended to diagnose MRSA infection nor to guide or monitor treatment for MRSA infections. RESULT CALLED TO, READ BACK BY AND VERIFIED WITH: VICK GREGORY AT 1305 07/08/19.PMF Performed at [facility], [address] [place], [place] [address]   Culture, blood (Routine X 2) w Reflex to ID Panel     Status: None (Preliminary result)   Collection Time: 07/09/19  2:02 PM   Specimen: BLOOD  Result Value Ref Range Status   Specimen Description BLOOD BLOOD RIGHT HAND  Final   Special Requests   Final    BOTTLES DRAWN AEROBIC AND ANAEROBIC Blood Culture adequate volume   Culture   Final    NO GROWTH < 24 HOURS Performed at [facility], [address] [place], [place] [address]    Report Status PENDING  Incomplete  Culture, blood (Routine X 2) w Reflex to ID Panel     Status: None (Preliminary result)   Collection Time: 07/09/19  3:57 PM   Specimen: BLOOD  Result Value Ref Range Status   Specimen Description BLOOD BLOOD RIGHT ARM  Final   Special Requests   Final    BOTTLES DRAWN AEROBIC ONLY Blood Culture adequate volume   Culture   Final    NO GROWTH < 24 HOURS Performed at [facility], [address], [place], [place] [address]    Report Status PENDING  Incomplete    Studies/Results: CT HEAD WO CONTRAST  Result Date: 07/09/2019 CLINICAL DATA:  59 year old female with history of altered mental status. EXAM: CT HEAD WITHOUT CONTRAST TECHNIQUE: Contiguous axial images were obtained from the base of the skull through the vertex without intravenous contrast. COMPARISON:  No priors.  FINDINGS: Brain: Patchy areas of mild decreased attenuation are noted throughout  the deep and periventricular white matter of the cerebral hemispheres bilaterally, compatible with chronic microvascular ischemic disease. No evidence of acute infarction, hemorrhage, hydrocephalus, extra-axial collection or mass lesion/mass effect. Vascular: No hyperdense vessel or unexpected calcification. Skull: Normal. Negative for fracture or focal lesion. Sinuses/Orbits: No acute finding. Other: None. IMPRESSION: 1. No acute intracranial abnormalities. 2. Very mild chronic microvascular ischemic changes in the cerebral white matter, as above. Electronically Signed   By: Trudie Reed M.D.   On: 07/09/2019 09:04   CT ANGIO CHEST PE W OR WO CONTRAST  Result Date: 07/09/2019 CLINICAL DATA:  Shortness of breath. Recent COVID-19. Elevated D-dimer EXAM: CT ANGIOGRAPHY CHEST WITH CONTRAST TECHNIQUE: Multidetector CT imaging of the chest was performed using the standard protocol during bolus administration of intravenous contrast. Multiplanar CT image reconstructions and MIPs were obtained to evaluate the vascular anatomy. CONTRAST:  75mL OMNIPAQUE IOHEXOL 350 MG/ML SOLN COMPARISON:  07/08/2019 FINDINGS: Cardiovascular: Central line to the distal SVC. Heart size upper limits normal. No pericardial effusion. Satisfactory opacification of pulmonary arteries noted, and there is no evidence of pulmonary emboli. Patient breathing through the acquisition degrades some of the images. Adequate contrast opacification of the thoracic aorta with no evidence of dissection, aneurysm, or stenosis. There is classic 3-vessel brachiocephalic arch anatomy without proximal stenosis. Mediastinum/Nodes: No hilar or mediastinal adenopathy. Lungs/Pleura: No pleural effusion. No pneumothorax. Coarse interstitial opacities in both upper lobes and posteriorly in the lung bases. No new airspace consolidation or discrete nodule identified. Upper Abdomen:  Wedge-shaped hypodense regions in the spleen suggesting infarcts, more conspicuous than on prior study. Surgical clips around the GE junction. No acute finding. Musculoskeletal: Anterior vertebral endplate spurring at multiple levels in the lower thoracic spine. No fracture or worrisome bone lesion. Review of the MIP images confirms the above findings. IMPRESSION: 1. Negative for acute PE or thoracic aortic dissection. 2. Possible splenic infarcts. 3. Stable pulmonary parenchymal changes since previous day's exam. Electronically Signed   By: Corlis Leak M.D.   On: 07/09/2019 12:36   US Venous Img Lower Bilateral (DVT)  Result Date: 07/08/2019 CLINICAL DATA:  59 year old female with elevated D-dimer and a history of prior COVID infection in December 2020 EXAM: BILATERAL LOWER EXTREMITY VENOUS DOPPLER ULTRASOUND TECHNIQUE: Gray-scale sonography with graded compression, as well as color Doppler and duplex ultrasound were performed to evaluate the lower extremity deep venous systems from the level of the common femoral vein and including the common femoral, femoral, profunda femoral, popliteal and calf veins including the posterior tibial, peroneal and gastrocnemius veins when visible. The superficial great saphenous vein was also interrogated. Spectral Doppler was utilized to evaluate flow at rest and with distal augmentation maneuvers in the common femoral, femoral and popliteal veins. COMPARISON:  None. FINDINGS: RIGHT LOWER EXTREMITY Common Femoral Vein: No evidence of thrombus. Normal compressibility, respiratory phasicity and response to augmentation. Saphenofemoral Junction: No evidence of thrombus. Normal compressibility and flow on color Doppler imaging. Profunda Femoral Vein: No evidence of thrombus. Normal compressibility and flow on color Doppler imaging. Femoral Vein: No evidence of thrombus. Normal compressibility, respiratory phasicity and response to augmentation. Popliteal Vein: No evidence of  thrombus. Normal compressibility, respiratory phasicity and response to augmentation. Calf Veins: No evidence of thrombus. Normal compressibility and flow on color Doppler imaging. Superficial Great Saphenous Vein: No evidence of thrombus. Normal compressibility. Venous Reflux:  None. Other Findings:  None. LEFT LOWER EXTREMITY Common Femoral Vein: No evidence of thrombus. Normal compressibility, respiratory phasicity and response to augmentation. Saphenofemoral Junction:  No evidence of thrombus. Normal compressibility and flow on color Doppler imaging. Profunda Femoral Vein: No evidence of thrombus. Normal compressibility and flow on color Doppler imaging. Femoral Vein: No evidence of thrombus. Normal compressibility, respiratory phasicity and response to augmentation. Popliteal Vein: No evidence of thrombus. Normal compressibility, respiratory phasicity and response to augmentation. Calf Veins: No evidence of thrombus. Normal compressibility and flow on color Doppler imaging. Superficial Great Saphenous Vein: No evidence of thrombus. Normal compressibility. Venous Reflux:  None. Other Findings:  None. IMPRESSION: No evidence of deep venous thrombosis in either lower extremity. Electronically Signed   By: Malachy Moan M.D.   On: 07/08/2019 09:54   ECHOCARDIOGRAM COMPLETE  Result Date: 07/08/2019    ECHOCARDIOGRAM REPORT   Patient Name:   [name] [name] Date of Exam: 07/08/2019 Medical Rec #:  [id number]       Height:       70.0 in Accession #:    [id number]      Weight:       350.0 lb Date of Birth:  03-17-1961       BSA:          2.648 m Patient Age:    58 years        BP:           99/52 mmHg Patient Gender: F               HR:           113 bpm. Exam Location:  [facility] Procedure: 2D Echo STAT ECHO Indications:     Chest Pain 786.50/ R07.9  History:         Patient has prior history of Echocardiogram examinations, most                  recent 04/10/2019.  Sonographer:     Wonda Cerise RDCS Referring Phys:   [id number] Eugenie Norrie Diagnosing Phys: Julien Nordmann MD  Sonographer Comments: Technically difficult study due to poor echo windows and patient is morbidly obese. Image acquisition challenging due to patient body habitus. IMPRESSIONS  1. Left ventricular ejection fraction, by estimation, is 60 to 65%. The left ventricle has normal function. The left ventricle has no regional wall motion abnormalities. Left ventricular diastolic parameters are consistent with Grade I diastolic dysfunction (impaired relaxation).  2. Right ventricular systolic function is normal. The right ventricular size is normal.  3. The inferior vena cava is normal in size with greater than 50% respiratory variability, suggesting right atrial pressure of 3 mmHg. FINDINGS  Left Ventricle: Left ventricular ejection fraction, by estimation, is 60 to 65%. The left ventricle has normal function. The left ventricle has no regional wall motion abnormalities. The left ventricular internal cavity size was normal in size. There is  no left ventricular hypertrophy. Left ventricular diastolic parameters are consistent with Grade I diastolic dysfunction (impaired relaxation). Right Ventricle: The right ventricular size is normal. No increase in right ventricular wall thickness. Right ventricular systolic function is normal. Left Atrium: Left atrial size was normal in size. Right Atrium: Right atrial size was normal in size. Pericardium: There is no evidence of pericardial effusion. Mitral Valve: The mitral valve is normal in structure. Normal mobility of the mitral valve leaflets. No evidence of mitral valve regurgitation. No evidence of mitral valve stenosis. Tricuspid Valve: The tricuspid valve is normal in structure. Tricuspid valve regurgitation is mild . No evidence of tricuspid stenosis. Aortic Valve: The aortic valve was not well visualized.  Aortic valve regurgitation is not visualized. No aortic stenosis is present. Aortic valve peak gradient  measures 11.6 mmHg. Pulmonic Valve: The pulmonic valve was normal in structure. Pulmonic valve regurgitation is not visualized. No evidence of pulmonic stenosis. Aorta: The aortic root is normal in size and structure. Venous: The inferior vena cava is normal in size with greater than 50% respiratory variability, suggesting right atrial pressure of 3 mmHg. IAS/Shunts: No atrial level shunt detected by color flow Doppler.  LEFT VENTRICLE PLAX 2D LVIDd:         4.39 cm  Diastology LVIDs:         2.99 cm  LV e' lateral:   9.90 cm/s LV PW:         1.36 cm  LV E/e' lateral: 9.6 LV IVS:        1.38 cm  LV e' medial:    4.68 cm/s LVOT diam:     2.10 cm  LV E/e' medial:  20.3 LV SV:         88 LV SV Index:   33 LVOT Area:     3.46 cm  RIGHT VENTRICLE RV Basal diam:  3.01 cm RV S prime:     14.60 cm/s TAPSE (M-mode): 1.7 cm LEFT ATRIUM             Index       RIGHT ATRIUM           Index LA diam:        2.40 cm 0.91 cm/m  RA Area:     14.70 cm LA Vol (A2C):   37.0 ml 13.97 ml/m RA Volume:   36.90 ml  13.94 ml/m LA Vol (A4C):   52.9 ml 19.98 ml/m LA Biplane Vol: 45.6 ml 17.22 ml/m  AORTIC VALVE                PULMONIC VALVE AV Area (Vmax): 2.77 cm    PV Vmax:       1.13 m/s AV Vmax:        170.00 cm/s PV Peak grad:  5.1 mmHg AV Peak Grad:   11.6 mmHg LVOT Vmax:      136.00 cm/s LVOT Vmean:     93.400 cm/s LVOT VTI:       0.255 m  AORTA Ao Root diam: 3.00 cm MITRAL VALVE MV Area (PHT): 5.54 cm     SHUNTS MV Decel Time: 137 msec     Systemic VTI:  0.26 m MV E velocity: 95.10 cm/s   Systemic Diam: 2.10 cm MV A velocity: 106.00 cm/s MV E/A ratio:  0.90 Julien Nordmann MD Electronically signed by Julien Nordmann MD Signature Date/Time: 07/08/2019/10:57:12 AM    Final     Assessment/Plan: TASHEIKA KITZMILLER is a 59 y.o. female with poorly controlled DM, massive obesity, recent COVID now with MRSA bacteremia of unknown source. No obvious skin and soft tissue infection. Has hx MRSA in past associated with bladder stimulator in  2011.  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  Clinically stabilizing.  Recommendations Repeat bcx NGTD TEE when stable from resp viewpoint to help decide on duration. Cont vanco Will need to monitor for metastatic sites of infection. Thank you very much for the consult. Will follow with you.  Mick Sell   07/10/2019, 8:37 AM

## 2019-07-10 NOTE — Progress Notes (Addendum)
Pharmacy Antibiotic Note  [name] [name] is a 59 y.o. female admitted on 07/07/2019 with sepsis.  Pharmacy has been consulted for Vancomycin dosing. Patient now with MRSA bacteremia. Patient has history of neurogenic bladder with implanted bladder stimulator. Urology stated the infection source is unlikely to be InterStim implant. Ciprofloxacin d/c per CCM. WBC trending down. Tmax 24 hours 98.22F. TTE without evidence of endocarditis. TEE recommended when patient is stable. Repeat Bcx ordered 3/22.   Plan: Vancomycin 1250 mg IV Q 12 hrs. Goal AUC 400-550. Expected AUC: 482.0, Css 13.6 SCr used: 1.15 (CrCl calculated based on adjBW) Vd: 0.5 (Obese BMI: 50.22)    Daily Scr per protocol. Renal function fluctuating (0.9>1.39>1.26>1.15). Anticipate levels today.   Height: 5\' 10"  (177.8 cm) Weight: (!) 350 lb (158.8 kg) IBW/kg (Calculated) : 68.5  Temp (24hrs), Avg:98.1 F (36.7 C), Min:97.8 F (36.6 C), Max:98.3 F (36.8 C)  Recent Labs  Lab 07/08/19 0014 07/08/19 0257 07/08/19 0702 07/08/19 2131 07/09/19 0420 07/10/19 0412  WBC 17.7*  --   --   --  21.7* 18.6*  CREATININE 0.90  --   --  1.39* 1.26* 1.15*  LATICACIDVEN 4.3* 5.1* 4.1*  --   --   --     Estimated Creatinine Clearance: 89.1 mL/min (A) (by C-G formula based on SCr of 1.15 mg/dL (H)).    Allergies  Allergen Reactions  . Cephalexin Hives  . Codeine Palpitations, Nausea Only, Nausea And Vomiting, Rash and Shortness Of Breath    "makes heart fly, she gets flushed and passes out"  . Doxycycline Rash  . Propoxyphene Rash and Shortness Of Breath    Increase heart rate  . Sulfa Antibiotics Palpitations, Nausea Only, Shortness Of Breath and Hives    "makes heart fly, she gets flushed and passes out"  . Lovenox [Enoxaparin Sodium] Hives  . Hydrocodone Nausea And Vomiting    Hear racing & breaks out into a cold sweat.  . Meropenem Rash    Erythematous, hot, pruritic rash over arms, chest, back, abdomen, and face occurred  at the end of meropenem infusion on 02/22/18    Antimicrobials this admission: Aztreonam 3/21 >> 3/21 Ciprofloxacin 3/21>> 3/22 Vancomycin 3/21>>   Dose adjustments this admission:   Microbiology results: 3/22 BCx: Pending 3/21 BCx: 4/4 MRSA growth 3/21 MRSA PCR: Positive  3/20 UCx: no growth    Thank you for allowing pharmacy to be a part of this patient's care.  4/20 (Student Pharmacist) 07/10/2019 8:04 AM

## 2019-07-11 LAB — GLUCOSE, CAPILLARY
Glucose-Capillary: 141 mg/dL — ABNORMAL HIGH (ref 70–99)
Glucose-Capillary: 134 mg/dL — ABNORMAL HIGH (ref 70–99)
Glucose-Capillary: 115 mg/dL — ABNORMAL HIGH (ref 70–99)
Glucose-Capillary: 144 mg/dL — ABNORMAL HIGH (ref 70–99)
Glucose-Capillary: 131 mg/dL — ABNORMAL HIGH (ref 70–99)
Glucose-Capillary: 134 mg/dL — ABNORMAL HIGH (ref 70–99)
Glucose-Capillary: 149 mg/dL — ABNORMAL HIGH (ref 70–99)

## 2019-07-11 LAB — CBC WITH DIFFERENTIAL/PLATELET
Abs Immature Granulocytes: 0.36 10*3/uL — ABNORMAL HIGH (ref 0.00–0.07)
Basophils Absolute: 0.1 10*3/uL (ref 0.0–0.1)
Basophils Relative: 0 %
Eosinophils Absolute: 0 10*3/uL (ref 0.0–0.5)
Eosinophils Relative: 0 %
HCT: 30.5 % — ABNORMAL LOW (ref 36.0–46.0)
Hemoglobin: 9.2 g/dL — ABNORMAL LOW (ref 12.0–15.0)
Immature Granulocytes: 2 %
Lymphocytes Relative: 8 %
Lymphs Abs: 1.5 10*3/uL (ref 0.7–4.0)
MCH: 23.9 pg — ABNORMAL LOW (ref 26.0–34.0)
MCHC: 30.2 g/dL (ref 30.0–36.0)
MCV: 79.2 fL — ABNORMAL LOW (ref 80.0–100.0)
Monocytes Absolute: 0.8 10*3/uL (ref 0.1–1.0)
Monocytes Relative: 4 %
Neutro Abs: 15.7 10*3/uL — ABNORMAL HIGH (ref 1.7–7.7)
Neutrophils Relative %: 86 %
Platelets: 194 10*3/uL (ref 150–400)
RBC: 3.85 MIL/uL — ABNORMAL LOW (ref 3.87–5.11)
RDW: 17.3 % — ABNORMAL HIGH (ref 11.5–15.5)
WBC: 18.3 10*3/uL — ABNORMAL HIGH (ref 4.0–10.5)
nRBC: 0.2 % (ref 0.0–0.2)

## 2019-07-11 LAB — BLOOD GAS, VENOUS
Acid-Base Excess: 1.7 mmol/L (ref 0.0–2.0)
Bicarbonate: 26.6 mmol/L (ref 20.0–28.0)
O2 Saturation: 46.1 %
Patient temperature: 37
pCO2, Ven: 42 mmHg — ABNORMAL LOW (ref 44.0–60.0)
pH, Ven: 7.41 (ref 7.250–7.430)

## 2019-07-11 LAB — MAGNESIUM: Magnesium: 2.7 mg/dL — ABNORMAL HIGH (ref 1.7–2.4)

## 2019-07-11 LAB — COMPREHENSIVE METABOLIC PANEL
ALT: 42 U/L (ref 0–44)
AST: 40 U/L (ref 15–41)
Albumin: 2.5 g/dL — ABNORMAL LOW (ref 3.5–5.0)
Alkaline Phosphatase: 59 U/L (ref 38–126)
Anion gap: 9 (ref 5–15)
BUN: 50 mg/dL — ABNORMAL HIGH (ref 6–20)
CO2: 21 mmol/L — ABNORMAL LOW (ref 22–32)
Calcium: 8.3 mg/dL — ABNORMAL LOW (ref 8.9–10.3)
Chloride: 111 mmol/L (ref 98–111)
Creatinine, Ser: 1.18 mg/dL — ABNORMAL HIGH (ref 0.44–1.00)
GFR calc Af Amer: 59 mL/min — ABNORMAL LOW (ref >60)
GFR calc non Af Amer: 51 mL/min — ABNORMAL LOW (ref >60)
Glucose, Bld: 154 mg/dL — ABNORMAL HIGH (ref 70–99)
Potassium: 3.4 mmol/L — ABNORMAL LOW (ref 3.5–5.1)
Sodium: 141 mmol/L (ref 135–145)
Total Bilirubin: 0.8 mg/dL (ref 0.3–1.2)
Total Protein: 6.5 g/dL (ref 6.5–8.1)

## 2019-07-11 LAB — PHOSPHORUS: Phosphorus: 4.7 mg/dL — ABNORMAL HIGH (ref 2.5–4.6)

## 2019-07-11 LAB — FIBRIN DERIVATIVES D-DIMER (ARMC ONLY): Fibrin derivatives D-dimer (ARMC): 1838.38 ng/mL (FEU) — ABNORMAL HIGH (ref 0.00–499.00)

## 2019-07-11 MED ORDER — QUETIAPINE FUMARATE 200 MG PO TABS: 300.0000 mg | ORAL_TABLET | Freq: Every day | ORAL | Status: DC

## 2019-07-11 MED ORDER — ORAL CARE MOUTH RINSE
15.0000 mL | Freq: Two times a day (BID) | OROMUCOSAL | Status: DC
  Administered 2019-07-11 – 2019-07-17 (×12): 15 mL via OROMUCOSAL

## 2019-07-11 MED ORDER — POTASSIUM CHLORIDE CRYS ER 20 MEQ PO TBCR: 40.0000 meq | EXTENDED_RELEASE_TABLET | Freq: Once | ORAL | Status: DC

## 2019-07-11 MED ORDER — VANCOMYCIN HCL 1500 MG/300ML IV SOLN
1500.0000 mg | INTRAVENOUS | Status: DC
  Administered 2019-07-11: 1500 mg via INTRAVENOUS
  Filled 2019-07-11 (×2): qty 300

## 2019-07-11 MED ORDER — LACTATED RINGERS IV BOLUS
1000.0000 mL | Freq: Once | INTRAVENOUS | Status: AC
  Administered 2019-07-11: 1000 mL via INTRAVENOUS

## 2019-07-11 MED ORDER — ENSURE MAX PROTEIN PO LIQD
11.0000 [oz_av] | Freq: Three times a day (TID) | ORAL | Status: DC
  Administered 2019-07-12 – 2019-07-17 (×9): 11 [oz_av] via ORAL
  Filled 2019-07-11: qty 330

## 2019-07-11 MED ORDER — POTASSIUM CHLORIDE 10 MEQ/100ML IV SOLN
10.0000 meq | INTRAVENOUS | Status: AC
  Administered 2019-07-11 (×2): 10 meq via INTRAVENOUS
  Filled 2019-07-11 (×2): qty 100

## 2019-07-11 MED ORDER — HYDROCORTISONE NA SUCCINATE PF 100 MG IJ SOLR
50.0000 mg | Freq: Three times a day (TID) | INTRAMUSCULAR | Status: DC
  Administered 2019-07-11 – 2019-07-12 (×3): 50 mg via INTRAVENOUS
  Filled 2019-07-11 (×3): qty 2

## 2019-07-11 MED ORDER — FAMOTIDINE IN NACL 20-0.9 MG/50ML-% IV SOLN
20.0000 mg | Freq: Two times a day (BID) | INTRAVENOUS | Status: DC
  Administered 2019-07-11 – 2019-07-12 (×3): 20 mg via INTRAVENOUS
  Filled 2019-07-11 (×3): qty 50

## 2019-07-11 MED ORDER — VANCOMYCIN HCL 1250 MG/250ML IV SOLN
1250.0000 mg | INTRAVENOUS | Status: DC
  Filled 2019-07-11: qty 250

## 2019-07-11 MED ORDER — DEXTROSE 5 % IV SOLN: INTRAVENOUS | Status: DC

## 2019-07-11 MED ORDER — DOPAMINE-DEXTROSE 3.2-5 MG/ML-% IV SOLN
0.0000 ug/kg/min | INTRAVENOUS | Status: DC
  Administered 2019-07-11: 5 ug/kg/min via INTRAVENOUS
  Filled 2019-07-11: qty 250

## 2019-07-11 NOTE — Progress Notes (Addendum)
Pharmacy Antibiotic Note  [name] [name] is a 59 y.o. female admitted on 07/07/2019 with sepsis.  Pharmacy has been consulted for Vancomycin dosing. Patient now with MRSA bacteremia. Patient has history of neurogenic bladder with implanted bladder stimulator. Urology stated the infection source is unlikely to be InterStim implant. Ciprofloxacin d/c per CCM. WBC trending down. Tmax 24 hours 98.64F. TTE without evidence of endocarditis. TEE recommended when patient is stable. Repeat Bcx ordered 3/22.   Vancomycin 1250 mg IV Q 12 hrs. Goal AUC 400-550. 3/23 @ 2100 Vancomycin peak: 44 3/24 @ 0433 Vancomycin trough: 31 Calculated AUC: 914.8 Calculated trough: 29.5  Supratherapeutic concentrations at current dosing  Plan: Based on supratherapeutic concentrations on prior dosing regimen will convert therapy to:  Vancomycin 1500 mg IV Q 24 hrs. Goal AUC 400-550. Expected AUC: 548.7, trough: 12.9  Daily Scr per protocol. Renal function fluctuating. CrCl likely over-estimating true renal clearance. Levels at steady state as indicated.    Height: 5\' 10"  (177.8 cm) Weight: (!) 350 lb (158.8 kg) IBW/kg (Calculated) : 68.5  Temp (24hrs), Avg:99.1 F (37.3 C), Min:98.1 F (36.7 C), Max:99.7 F (37.6 C)  Recent Labs  Lab 07/08/19 0014 07/08/19 0257 07/08/19 0702 07/08/19 2131 07/09/19 0420 07/10/19 0412 07/10/19 1033 07/10/19 2100 07/11/19 0433 07/11/19 0440  WBC 17.7*  --   --   --  21.7* 18.6*  --   --   --  18.3*  CREATININE 0.90  --   --  1.39* 1.26* 1.15*  --   --   --  1.18*  LATICACIDVEN 4.3* 5.1* 4.1*  --   --   --  1.6  --   --   --   VANCOTROUGH  --   --   --   --   --   --   --   --  31*  --   VANCOPEAK  --   --   --   --   --   --   --  44*  --   --     Estimated Creatinine Clearance: 86.9 mL/min (A) (by C-G formula based on SCr of 1.18 mg/dL (H)).    Allergies  Allergen Reactions  . Cephalexin Hives  . Codeine Palpitations, Nausea Only, Nausea And Vomiting, Rash and  Shortness Of Breath    "makes heart fly, she gets flushed and passes out"  . Doxycycline Rash  . Propoxyphene Rash and Shortness Of Breath    Increase heart rate  . Sulfa Antibiotics Palpitations, Nausea Only, Shortness Of Breath and Hives    "makes heart fly, she gets flushed and passes out"  . Lovenox [Enoxaparin Sodium] Hives  . Hydrocodone Nausea And Vomiting    Hear racing & breaks out into a cold sweat.  . Meropenem Rash    Erythematous, hot, pruritic rash over arms, chest, back, abdomen, and face occurred at the end of meropenem infusion on 02/22/18    Antimicrobials this admission: Aztreonam 3/21 >> 3/21 Ciprofloxacin 3/21>> 3/22 Vancomycin 3/21>>   Dose adjustments this admission: 3/24: Vancomycin 1250 q12h >> vancomycin 1500 q24h  Microbiology results: 3/22 BCx: Pending 3/21 BCx: 4/4 MRSA growth 3/21 MRSA PCR: Positive  3/20 UCx: no growth    Thank you for allowing pharmacy to be a part of this patient's care.  4/20  Pharmacy Resident 07/11/2019 11:49 AM

## 2019-07-11 NOTE — Progress Notes (Addendum)
PHARMACY CONSULT NOTE  Pharmacy Consult for Electrolyte Monitoring and Replacement   Recent Labs: Potassium (mmol/L)  Date Value  07/11/2019 3.4 (L)  05/27/2014 3.8   Magnesium (mg/dL)  Date Value  33/35/4562 2.7 (H)  12/02/2013 1.1 (L)   Calcium (mg/dL)  Date Value  56/38/9373 8.3 (L)   Calcium, Total (mg/dL)  Date Value  42/87/6811 8.5   Albumin (g/dL)  Date Value  57/26/2035 2.5 (L)  11/16/2018 4.2  12/03/2013 2.7 (L)   Phosphorus (mg/dL)  Date Value  59/74/1638 4.7 (H)   Sodium (mmol/L)  Date Value  07/11/2019 141  11/16/2018 134  05/27/2014 136     Assessment: 59 year old female admitted with difficulty breathing. Patient recently hospitalized 04/06/19 to 05/04/19 post-COVID. Pharmacy consulted for management of electrolytes and glucose control.  Goal of Therapy:  Electrolytes WNL Glucose < 180 (ideally)  Plan:  Electrolytes:  -K 3.4, patient unable to tolerate PO at this time so will give KCl 10 mEq IV x 4 -Scr stable but above baseline, UOP the same/slightly decreased -LBM 3/19 -Follow-up AM labs tomorrow   Glucose (3/21 Hgb A1C 11.6%):  -At goal (140-180 mg/dL) today -On stress dose steroids -Poor PO intake -Continue levemir to 25 units BID per diabetes coordinator recommendations -Continue SSI -On Tresiba and linagliptin PTA   Tressie Ellis  Pharmacy Resident 07/11/2019 7:27 AM

## 2019-07-11 NOTE — Progress Notes (Addendum)
Shift summary:  - Patient responsive but lethargic this AM.  - Weaning down on Precedex as tolerated. - Off of pressors as of 0430 this AM. - Dopamine infusion initiated this afternoon at 1422 hrs for bradycardia, hypotension, and low UOP. - Precedex off since 1228 hrs. - BiPAP x 2 hours this evening for increased WOB. Off at 1800 hrs.

## 2019-07-11 NOTE — Plan of Care (Signed)
  Problem: Fluid Volume: Goal: Hemodynamic stability will improve Outcome: Progressing   Problem: Clinical Measurements: Goal: Diagnostic test results will improve Outcome: Progressing Goal: Signs and symptoms of infection will decrease Outcome: Progressing   Problem: Respiratory: Goal: Ability to maintain adequate ventilation will improve Outcome: Progressing   Problem: Education: Goal: Ability to state activities that reduce stress will improve Outcome: Progressing   Problem: Coping: Goal: Ability to identify and develop effective coping behavior will improve Outcome: Progressing   Problem: Self-Concept: Goal: Ability to identify factors that promote anxiety will improve Outcome: Progressing Goal: Level of anxiety will decrease Outcome: Progressing Goal: Ability to modify response to factors that promote anxiety will improve Outcome: Progressing   Problem: Education: Goal: Knowledge of General Education information will improve Description: Including pain rating scale, medication(s)/side effects and non-pharmacologic comfort measures Outcome: Progressing   Problem: Health Behavior/Discharge Planning: Goal: Ability to manage health-related needs will improve Outcome: Progressing   Problem: Clinical Measurements: Goal: Ability to maintain clinical measurements within normal limits will improve Outcome: Progressing Goal: Will remain free from infection Outcome: Progressing Goal: Diagnostic test results will improve Outcome: Progressing Goal: Respiratory complications will improve Outcome: Progressing Goal: Cardiovascular complication will be avoided Outcome: Progressing   Problem: Activity: Goal: Risk for activity intolerance will decrease Outcome: Progressing   Problem: Nutrition: Goal: Adequate nutrition will be maintained Outcome: Progressing   Problem: Coping: Goal: Level of anxiety will decrease Outcome: Progressing   Problem: Elimination: Goal:  Will not experience complications related to bowel motility Outcome: Progressing Goal: Will not experience complications related to urinary retention Outcome: Progressing   Problem: Pain Managment: Goal: General experience of comfort will improve Outcome: Progressing   Problem: Safety: Goal: Ability to remain free from injury will improve Outcome: Progressing   Problem: Skin Integrity: Goal: Risk for impaired skin integrity will decrease Outcome: Progressing

## 2019-07-11 NOTE — Progress Notes (Signed)
Initial Nutrition Assessment  DOCUMENTATION CODES:   Obesity unspecified  INTERVENTION:  Recommend liberalizing diet to carbohydrate modified.  Provide Ensure Max Protein po TID, each supplement provides 150 kcal and 30 grams of protein.  NUTRITION DIAGNOSIS:   Inadequate oral intake related to lethargy/confusion as evidenced by meal completion < 50%.  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   Patient was still being weaned off of Precedex this morning and was unable to provide any history. She was not alert enough to take PO at time of assessment this morning but plan was to re-assess that later in the day when she was more alert.   Weight is currently much higher than previous weights in chart. She was previously 135-138 kg. She is currently documented to be 158.8 kg (350 lbs). She does have edema that could be causing some increase in weight but unsure if it is 20 kg worth of fluid. Will continue to monitor weight trend.  Medications reviewed and include: Solu-Cortef 50 mg Q8hrs IV, Novolog 0-20 units Q4hrs, Levemir 25 units BID, levothyroxine, Precedex gtt, famotidine, vancomycin.  Labs reviewed: CBG 115-141, Potassium 3.4, CO2 21, BUN 50, Creatinine 1.18, Phosphorus 4.7, Magnesium 2.7.  Patient does not meet criteria for malnutrition at this time.  Discussed with RN and on rounds.  NUTRITION - FOCUSED PHYSICAL EXAM:    Most Recent Value  Orbital Region  No depletion  Upper Arm Region  No depletion  Thoracic and Lumbar Region  No depletion  Buccal Region  No  depletion  Temple Region  No depletion  Clavicle Bone Region  No depletion  Clavicle and Acromion Bone Region  No depletion  Scapular Bone Region  Unable to assess  Dorsal Hand  No depletion  Patellar Region  No depletion  Anterior Thigh Region  No depletion  Posterior Calf Region  No depletion  Edema (RD Assessment)  Mild  Hair  Reviewed  Eyes  Unable to assess  Mouth  Unable to assess  Skin  Reviewed  Nails  Reviewed     Diet Order:   Diet Order            Diet heart healthy/carb modified Room service appropriate? Yes; Fluid consistency: Thin  Diet effective now             EDUCATION NEEDS:   No education needs have been identified at this time  Skin:  Skin Assessment: Reviewed RN Assessment  Last BM:  07/06/2019  Height:   Ht Readings from Last 1 Encounters:  07/07/19 5\' 10"  (1.778 m)   Weight:   Wt Readings from Last 1 Encounters:  07/07/19 (!) 158.8 kg   Ideal Body Weight:  68.2 kg  BMI:  Body mass index is 50.22 kg/m.  Estimated Nutritional Needs:   Kcal:  2700-3000  Protein:  >/= 135 grams  Fluid:  2 L/day  07/09/19, MS, RD, LDN Pager number available on Amion

## 2019-07-11 NOTE — Progress Notes (Signed)
[facility] INFECTIOUS DISEASE PROGRESS NOTE Date of Admission:  07/07/2019     ID: [name] [name] is a 59 y.o. female with  MRSA bacteremia Principal Problem:   Septic shock (HCC) Active Problems:   COPD (chronic obstructive pulmonary disease) (HCC)   Essential (primary) hypertension   Chronic diastolic CHF (congestive heart failure) (HCC)   Rheumatoid arthritis (HCC)   Chronic pain syndrome   Hypothyroidism   Acute metabolic encephalopathy   Hyperglycemia due to type 2 diabetes mellitus (HCC)   Morbid obesity with BMI of 50.0-59.9, adult (HCC)   History of COVID-19   Severe sepsis with septic shock (HCC)   Subjective: No fevers, WBC 18. FU bcx ngtd from 3/22. Not on pressors. HD stable. Still lethargic, on precedex.  ROS  Unable to obtain  Medications:  Antibiotics Given (last 72 hours)    Date/Time Action Medication Dose Rate   07/08/19 1258 New Bag/Given   ciprofloxacin (CIPRO) IVPB 400 mg 400 mg 200 mL/hr   07/08/19 1659 New Bag/Given   vancomycin (VANCOREADY) IVPB 1250 mg/250 mL 1,250 mg 166.7 mL/hr   07/09/19 0017 New Bag/Given   ciprofloxacin (CIPRO) IVPB 400 mg 400 mg 200 mL/hr   07/09/19 0516 New Bag/Given   vancomycin (VANCOREADY) IVPB 1250 mg/250 mL 1,250 mg 166.7 mL/hr   07/09/19 1723 New Bag/Given   vancomycin (VANCOREADY) IVPB 1250 mg/250 mL 1,250 mg 166.7 mL/hr   07/10/19 0617 New Bag/Given   vancomycin (VANCOREADY) IVPB 1250 mg/250 mL 1,250 mg 166.7 mL/hr   07/10/19 1737 New Bag/Given   vancomycin (VANCOREADY) IVPB 1250 mg/250 mL 1,250 mg 166.7 mL/hr     . busPIRone  10 mg Oral BID  . Chlorhexidine Gluconate Cloth  6 each Topical Daily  . Chlorhexidine Gluconate Cloth  6 each Topical Q0600  . famotidine  20 mg Oral Daily  . heparin injection (subcutaneous)  5,000 Units Subcutaneous Q8H  . hydrocortisone sod succinate (SOLU-CORTEF) inj  50 mg Intravenous Q6H  . insulin aspart  0-20 Units Subcutaneous Q4H  . insulin detemir  25 Units Subcutaneous  BID  . levothyroxine  88 mcg Oral QAC breakfast  . mouth rinse  15 mL Mouth Rinse BID  . mupirocin ointment  1 application Nasal BID  . pramipexole  0.125 mg Oral BID    Objective: Vital signs in last 24 hours: Temp:  [98.1 F (36.7 C)-99.7 F (37.6 C)] 98.1 F (36.7 C) (03/24 0800) Pulse Rate:  [53-61] 55 (03/24 0600) Resp:  [16-34] 22 (03/24 0800) BP: (80-146)/(44-72) 114/61 (03/24 0800) SpO2:  [88 %-100 %] 100 % (03/24 0800) Constitutional:  Morbidly obese, sedated. On O2 HENT: /AT, PERRLA, no scleral icterus Mouth/Throat: Oropharynx is clear and moist. No oropharyngeal exudate.  Cardiovascular: Normal rate, regular rhythm and normal heart sounds.  Pulmonary/Chest: Effort normal and breath sounds normal. No respiratory distress.  has no wheezes.  Neck = supple, no nuchal rigidity Abdominal: massive pannusSoft. Bowel sounds are normal.  exhibits no distension. There is no tenderness.  Lymphadenopathy: no cervical adenopathy. No axillary adenopathy Neurological: on sedation  Skin: Skin is warm and dry. No rash noted. No erythema. 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   Psychiatric: sedated Line L neck TLC wnl  Lab Results Recent Labs    07/10/19 0412 07/11/19 0440  WBC 18.6* 18.3*  HGB 9.4* 9.2*  HCT 31.3* 30.5*  NA 138 141  K 3.8 3.4*  CL 107 111  CO2 23 21*  BUN 47* 50*  CREATININE  1.15* 1.18*    Microbiology: Results for orders placed or performed during the hospital encounter of 07/07/19  Urine culture     Status: None   Collection Time: 07/07/19 12:14 AM   Specimen: In/Out Cath Urine  Result Value Ref Range Status   Specimen Description   Final    IN/OUT CATH URINE Performed at [facility], [address] [place], [place] [address]    Special Requests   Final    NONE Performed at [facility], [address] [place], [place] [address]    Culture   Final    NO GROWTH Performed at [facility], [address],  [place], [place] [address]    Report Status 07/09/2019 FINAL  Final  Blood Culture (routine x 2)     Status: Abnormal   Collection Time: 07/08/19 12:14 AM   Specimen: BLOOD  Result Value Ref Range Status   Specimen Description   Final    BLOOD LEFT ANTECUBITAL Performed at [facility], [address] [place], [place] [address]    Special Requests   Final    BOTTLES DRAWN AEROBIC AND ANAEROBIC Blood Culture adequate volume Performed at [facility], [address] [place], [place] [address]    Culture  Setup Time   Final    GRAM POSITIVE COCCI IN BOTH AEROBIC AND ANAEROBIC BOTTLES CRITICAL RESULT CALLED TO, READ BACK BY AND VERIFIED WITH: ABBY ELLINGTON AT 4462 ON 07/08/19 SNG Performed at [facility], [address], [place], [place] [address]    Culture METHICILLIN RESISTANT STAPHYLOCOCCUS AUREUS (A)  Final   Report Status 07/10/2019 FINAL  Final   Organism ID, Bacteria METHICILLIN RESISTANT STAPHYLOCOCCUS AUREUS  Final      Susceptibility   Methicillin resistant staphylococcus aureus - MIC*    CIPROFLOXACIN >=8 RESISTANT Resistant     ERYTHROMYCIN >=8 RESISTANT Resistant     GENTAMICIN <=0.5 SENSITIVE Sensitive     OXACILLIN >=4 RESISTANT Resistant     TETRACYCLINE <=1 SENSITIVE Sensitive     VANCOMYCIN 1 SENSITIVE Sensitive     TRIMETH/SULFA <=10 SENSITIVE Sensitive     CLINDAMYCIN >=8 RESISTANT Resistant     RIFAMPIN <=0.5 SENSITIVE Sensitive     Inducible Clindamycin NEGATIVE Sensitive     * METHICILLIN RESISTANT STAPHYLOCOCCUS AUREUS  Blood Culture (routine x 2)     Status: Abnormal   Collection Time: 07/08/19 12:14 AM   Specimen: BLOOD  Result Value Ref Range Status   Specimen Description   Final    BLOOD RIGHT ANTECUBITAL Performed at [facility], [address] [place], [place] [address]    Special Requests   Final    BOTTLES DRAWN AEROBIC AND ANAEROBIC Blood Culture results may not be optimal due to an excessive volume of  blood received in culture bottles Performed at [facility], [address] [place], [place] [address]    Culture  Setup Time   Final    GRAM POSITIVE COCCI IN BOTH AEROBIC AND ANAEROBIC BOTTLES CRITICAL VALUE NOTED.  VALUE IS CONSISTENT WITH PREVIOUSLY REPORTED AND CALLED VALUE. Performed at [facility], [address] [place], [place] [address]    Culture (A)  Final    STAPHYLOCOCCUS AUREUS SUSCEPTIBILITIES PERFORMED ON PREVIOUS CULTURE WITHIN THE LAST 5 DAYS. Performed at [facility], [address] [place], [place] [address]    Report Status 07/10/2019 FINAL  Final  Blood Culture ID Panel (Reflexed)  Status: Abnormal   Collection Time: 07/08/19 12:14 AM  Result Value Ref Range Status   Enterococcus species NOT DETECTED NOT DETECTED Final   Listeria monocytogenes NOT DETECTED NOT DETECTED Final   Staphylococcus species DETECTED (A) NOT DETECTED Final    Comment: CRITICAL RESULT CALLED TO, READ BACK BY AND VERIFIED WITH: CHARLES SHANLEVER AT 1610 ON 07/08/19 SNG    Staphylococcus aureus (BCID) DETECTED (A) NOT DETECTED Final    Comment: Methicillin (oxacillin)-resistant Staphylococcus aureus (MRSA). MRSA is predictably resistant to beta-lactam antibiotics (except ceftaroline). Preferred therapy is vancomycin unless clinically contraindicated. Patient requires contact precautions if  hospitalized. CRITICAL RESULT CALLED TO, READ BACK BY AND VERIFIED WITH: CHARLES SHANLEVER AT 9604 ON 07/08/19 SNG    Methicillin resistance DETECTED (A) NOT DETECTED Final    Comment: CRITICAL RESULT CALLED TO, READ BACK BY AND VERIFIED WITH: CHARLES SHANLEVER AT 5409 ON 07/08/19 SNG    Streptococcus species NOT DETECTED NOT DETECTED Final   Streptococcus agalactiae NOT DETECTED NOT DETECTED Final   Streptococcus pneumoniae NOT DETECTED NOT DETECTED Final   Streptococcus pyogenes NOT DETECTED NOT DETECTED Final   Acinetobacter baumannii NOT DETECTED NOT DETECTED  Final   Enterobacteriaceae species NOT DETECTED NOT DETECTED Final   Enterobacter cloacae complex NOT DETECTED NOT DETECTED Final   Escherichia coli NOT DETECTED NOT DETECTED Final   Klebsiella oxytoca NOT DETECTED NOT DETECTED Final   Klebsiella pneumoniae NOT DETECTED NOT DETECTED Final   Proteus species NOT DETECTED NOT DETECTED Final   Serratia marcescens NOT DETECTED NOT DETECTED Final   Haemophilus influenzae NOT DETECTED NOT DETECTED Final   Neisseria meningitidis NOT DETECTED NOT DETECTED Final   Pseudomonas aeruginosa NOT DETECTED NOT DETECTED Final   Candida albicans NOT DETECTED NOT DETECTED Final   Candida glabrata NOT DETECTED NOT DETECTED Final   Candida krusei NOT DETECTED NOT DETECTED Final   Candida parapsilosis NOT DETECTED NOT DETECTED Final   Candida tropicalis NOT DETECTED NOT DETECTED Final    Comment: Performed at [facility], [address] [place], [place] [address]  Respiratory Panel by RT PCR (Flu A&B, Covid) - Nasopharyngeal Swab     Status: None   Collection Time: 07/08/19  1:18 AM   Specimen: Nasopharyngeal Swab  Result Value Ref Range Status   SARS Coronavirus 2 by RT PCR NEGATIVE NEGATIVE Final    Comment: (NOTE) SARS-CoV-2 target nucleic acids are NOT DETECTED. The SARS-CoV-2 RNA is generally detectable in upper respiratoy specimens during the acute phase of infection. The lowest concentration of SARS-CoV-2 viral copies this assay can detect is 131 copies/mL. A negative result does not preclude SARS-Cov-2 infection and should not be used as the sole basis for treatment or other patient management decisions. A negative result may occur with  improper specimen collection/handling, submission of specimen other than nasopharyngeal swab, presence of viral mutation(s) within the areas targeted by this assay, and inadequate number of viral copies (<131 copies/mL). A negative result must be combined with clinical observations, patient history,  and epidemiological information. The expected result is Negative. Fact Sheet for Patients:  PinkCheek.be Fact Sheet for Healthcare Providers:  GravelBags.it This test is not yet ap proved or cleared by the [place] FDA and  has been authorized for detection and/or diagnosis of SARS-CoV-2 by FDA under an Emergency Use Authorization (EUA). This EUA will remain  in effect (meaning this test can be used) for the duration of the COVID-19 declaration under Section 564(b)(1) of the Act, 21 U.S.C. section 360bbb-3(b)(1), unless  the authorization is terminated or revoked sooner.    Influenza A by PCR NEGATIVE NEGATIVE Final   Influenza B by PCR NEGATIVE NEGATIVE Final    Comment: (NOTE) The Xpert Xpress SARS-CoV-2/FLU/RSV assay is intended as an aid in  the diagnosis of influenza from Nasopharyngeal swab specimens and  should not be used as a sole basis for treatment. Nasal washings and  aspirates are unacceptable for Xpert Xpress SARS-CoV-2/FLU/RSV  testing. Fact Sheet for Patients: https://www.moore.com/ Fact Sheet for Healthcare Providers: https://www.young.biz/ This test is not yet approved or cleared by the [place] FDA and  has been authorized for detection and/or diagnosis of SARS-CoV-2 by  FDA under an Emergency Use Authorization (EUA). This EUA will remain  in effect (meaning this test can be used) for the duration of the  Covid-19 declaration under Section 564(b)(1) of the Act, 21  U.S.C. section 360bbb-3(b)(1), unless the authorization is  terminated or revoked. Performed at [facility], [address] [place], [place] [address]   MRSA PCR Screening     Status: Abnormal   Collection Time: 07/08/19  7:51 AM   Specimen: Nasal Mucosa; Nasopharyngeal  Result Value Ref Range Status   MRSA by PCR POSITIVE (A) NEGATIVE Final    Comment:        The GeneXpert MRSA  Assay (FDA approved for NASAL specimens only), is one component of a comprehensive MRSA colonization surveillance program. It is not intended to diagnose MRSA infection nor to guide or monitor treatment for MRSA infections. RESULT CALLED TO, READ BACK BY AND VERIFIED WITH: VICK GREGORY AT 1305 07/08/19.PMF Performed at [facility], [address] [place], [place] [address]   Culture, blood (Routine X 2) w Reflex to ID Panel     Status: None (Preliminary result)   Collection Time: 07/09/19  2:02 PM   Specimen: BLOOD  Result Value Ref Range Status   Specimen Description BLOOD BLOOD RIGHT HAND  Final   Special Requests   Final    BOTTLES DRAWN AEROBIC AND ANAEROBIC Blood Culture adequate volume   Culture   Final    NO GROWTH 2 DAYS Performed at [facility], [address], [place], [place] [address]    Report Status PENDING  Incomplete  Culture, blood (Routine X 2) w Reflex to ID Panel     Status: None (Preliminary result)   Collection Time: 07/09/19  3:57 PM   Specimen: BLOOD  Result Value Ref Range Status   Specimen Description BLOOD BLOOD RIGHT ARM  Final   Special Requests   Final    BOTTLES DRAWN AEROBIC ONLY Blood Culture adequate volume   Culture   Final    NO GROWTH 2 DAYS Performed at [facility], [address], [place], [place] [address]    Report Status PENDING  Incomplete    Studies/Results: CT HEAD WO CONTRAST  Result Date: 07/09/2019 CLINICAL DATA:  59 year old female with history of altered mental status. EXAM: CT HEAD WITHOUT CONTRAST TECHNIQUE: Contiguous axial images were obtained from the base of the skull through the vertex without intravenous contrast. COMPARISON:  No priors. FINDINGS: Brain: Patchy areas of mild decreased attenuation are noted throughout the deep and periventricular white matter of the cerebral hemispheres bilaterally, compatible with chronic microvascular ischemic disease. No evidence of acute infarction,  hemorrhage, hydrocephalus, extra-axial collection or mass lesion/mass effect. Vascular: No hyperdense vessel or unexpected calcification. Skull: Normal. Negative for fracture or focal lesion. Sinuses/Orbits: No acute finding. Other: None. IMPRESSION: 1.  No acute intracranial abnormalities. 2. Very mild chronic microvascular ischemic changes in the cerebral white matter, as above. Electronically Signed   By: Trudie Reed M.D.   On: 07/09/2019 09:04   CT ANGIO CHEST PE W OR WO CONTRAST  Result Date: 07/09/2019 CLINICAL DATA:  Shortness of breath. Recent COVID-19. Elevated D-dimer EXAM: CT ANGIOGRAPHY CHEST WITH CONTRAST TECHNIQUE: Multidetector CT imaging of the chest was performed using the standard protocol during bolus administration of intravenous contrast. Multiplanar CT image reconstructions and MIPs were obtained to evaluate the vascular anatomy. CONTRAST:  75mL OMNIPAQUE IOHEXOL 350 MG/ML SOLN COMPARISON:  07/08/2019 FINDINGS: Cardiovascular: Central line to the distal SVC. Heart size upper limits normal. No pericardial effusion. Satisfactory opacification of pulmonary arteries noted, and there is no evidence of pulmonary emboli. Patient breathing through the acquisition degrades some of the images. Adequate contrast opacification of the thoracic aorta with no evidence of dissection, aneurysm, or stenosis. There is classic 3-vessel brachiocephalic arch anatomy without proximal stenosis. Mediastinum/Nodes: No hilar or mediastinal adenopathy. Lungs/Pleura: No pleural effusion. No pneumothorax. Coarse interstitial opacities in both upper lobes and posteriorly in the lung bases. No new airspace consolidation or discrete nodule identified. Upper Abdomen: Wedge-shaped hypodense regions in the spleen suggesting infarcts, more conspicuous than on prior study. Surgical clips around the GE junction. No acute finding. Musculoskeletal: Anterior vertebral endplate spurring at multiple levels in the lower thoracic  spine. No fracture or worrisome bone lesion. Review of the MIP images confirms the above findings. IMPRESSION: 1. Negative for acute PE or thoracic aortic dissection. 2. Possible splenic infarcts. 3. Stable pulmonary parenchymal changes since previous day's exam. Electronically Signed   By: Corlis Leak M.D.   On: 07/09/2019 12:36   US Abdomen Complete  Result Date: 07/10/2019 CLINICAL DATA:  Inpatient. Abdominal pain for 4 days. History of cholecystectomy. EXAM: ABDOMEN ULTRASOUND COMPLETE COMPARISON:  07/08/2019 CT chest, abdomen and pelvis. FINDINGS: Gallbladder: Surgically absent. Common bile duct: Diameter: 7 mm Liver: No focal lesion identified. Within normal limits in parenchymal echogenicity. Portal vein is patent on color Doppler imaging with normal direction of blood flow towards the liver. IVC: No abnormality visualized. Pancreas: Visualized portion unremarkable. Spleen: Mild splenomegaly. Craniocaudal splenic length 15.8 cm. Splenic volume approximately 902 cc. No splenic mass. Right Kidney: Length: 11.3 cm. Echogenic thin right renal parenchyma. No hydronephrosis. No renal mass. Left Kidney: Length: 13.5 cm. Echogenic thin left renal parenchyma. No hydronephrosis. No renal mass. Abdominal aorta: No aneurysm visualized. Other findings: None. IMPRESSION: 1. Mild splenomegaly. 2. Echogenic atrophic kidneys, compatible with nonspecific chronic or acute on chronic renal parenchymal disease. No hydronephrosis. 3. Bile ducts are within normal post cholecystectomy limits. CBD diameter 7 mm. Electronically Signed   By: Delbert Phenix M.D.   On: 07/10/2019 09:38    Assessment/Plan: [name] [name] is a 59 y.o. female with poorly controlled DM, massive obesity, recent COVID now with MRSA bacteremia of unknown source. No obvious skin and soft tissue infection. 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  Has splenic  lesions noted on CT - possible infarcts.  3.23 - no fevers, a little  More interactive today. WBC elevated but on steroids. Recommendations Repeat bcx NGTD TEE when stable from resp viewpoint to help decide on duration. Cont vanco - monitor levels with worsening renal fxn. Will need to monitor for metastatic sites of infection. Thank you very much  for the consult. Will follow with you.  Mick Sell   07/11/2019, 8:17 AM

## 2019-07-11 NOTE — Progress Notes (Signed)
CRITICAL CARE NOTE 20F hx of DM2, HTN, RA, MDD, HFpEF,OSA, BMI >50, s/p COVID19 with severe ARDS, came in febrile hypoxemic with elevated lactate and CT chest abd showing poss pyelo and spenic infarct.MRSA BACTEREMIA   SIGNIFICANT EVENTS/STUDIES: 03/21: Pt admitted to ICU with hypotension secondary to septic shock MRSA BACTEREMIA 03/21: CT Chest/Abd/Pelvisrevealed mixed ground-glass and consolidative opacity seen towards the lung apices bilaterally, poorly assessed given extensive respiratory motion artifact at the time of exam. Differential diagnosis could reflect residual or recurrent infection change in the setting of prior COVID-19. Extensive bilateral perinephric stranding and hazy retroperitoneal attenuation. The appearance is nonspecific, and can be seen with urinary tractinfection/pyelonephritis with minimal thickening of the bladder compatible with cystitis as well. Consider correlation with urinalysis. Questionable area of geographic hypoattenuation affecting the posterior spleen and periphery of the liver, may represent an area of developing splenic infarction. Marked laxity of the anterior abdominal wall with periumbilical postsurgical changes and a fat and simple fluid containing collection to the right of the umbilicus. Borderline enlarged left inguinal lymph nodes are similar to a comparison study 04/15/2013 and likely reactive. Aortic Atherosclerosis 3/22 remains on pressors, critically ill 3/22 family at bedside updated 3/23 remains septic shock, high risk for cardiac arrest and intubation Son at bedside updated 3/24 weaning off pressors, weaning off precedex     CC  follow up septic shock  SUBJECTIVE Patient remains critically ill Prognosis is guarded Weaning off pressors and precedex    BP (!) 113/54   Pulse (!) 55   Temp 98.1 F (36.7 C) (Axillary)   Resp 20   Ht 5\' 10"  (1.778 m)   Wt (!) 158.8 kg   LMP 04/20/2001   SpO2 98%   BMI 50.22 kg/m     I/O last 3 completed shifts: In: 3118.4 [I.V.:2368.4; IV Piggyback:750] Out: 1480 [Urine:1480] Total I/O In: 81 [I.V.:81] Out: 565 [Urine:565]  SpO2: 98 % O2 Flow Rate (L/min): 3 L/min FiO2 (%): 40 %  Estimated body mass index is 50.22 kg/m as calculated from the following:   Height as of this encounter: 5\' 10"  (1.778 m).   Weight as of this encounter: 158.8 kg.  SIGNIFICANT EVENTS   REVIEW OF SYSTEMS  PATIENT IS UNABLE TO PROVIDE COMPLETE REVIEW OF SYSTEMS DUE TO SEVERE CRITICAL ILLNESS        PHYSICAL EXAMINATION:  GENERAL:critically ill appearing,  HEAD: Normocephalic, atraumatic.  EYES: Pupils equal, round, reactive to light.  No scleral icterus.  MOUTH: Moist mucosal membrane. NECK: Supple.  PULMONARY: +rhonchi,  CARDIOVASCULAR: S1 and S2. Regular rate and rhythm. No murmurs, rubs, or gallops.  GASTROINTESTINAL: Soft, nontender, -distended.  Positive bowel sounds.   MUSCULOSKELETAL: No swelling, clubbing, or edema.  NEUROLOGIC:lethargic SKIN:intact,warm,dry  MEDICATIONS: I have reviewed all medications and confirmed regimen as documented   CULTURE RESULTS   Recent Results (from the past 240 hour(s))  Urine culture     Status: None   Collection Time: 07/07/19 12:14 AM   Specimen: In/Out Cath Urine  Result Value Ref Range Status   Specimen Description   Final    IN/OUT CATH URINE Performed at [facility], [address] [place], [address] [place]    Special Requests   Final    NONE Performed at [facility], [address], [place], [address] [place]    Culture   Final    NO GROWTH Performed at [facility], [address] [place], [address] [place]    Report Status 07/09/2019  FINAL  Final  Blood Culture (routine x 2)     Status: Abnormal   Collection Time: 07/08/19 12:14 AM   Specimen: BLOOD  Result Value Ref Range Status   Specimen Description   Final    BLOOD LEFT ANTECUBITAL Performed at [facility], [address], [place], [place] [address]    Special Requests   Final    BOTTLES DRAWN AEROBIC AND ANAEROBIC Blood Culture adequate volume Performed at [facility], [address] [place], [place] [address]    Culture  Setup Time   Final    GRAM POSITIVE COCCI IN BOTH AEROBIC AND ANAEROBIC BOTTLES CRITICAL RESULT CALLED TO, READ BACK BY AND VERIFIED WITH: ABBY ELLINGTON AT 9381 ON 07/08/19 SNG Performed at [facility], [place] [address] [place], [place] [address]    Culture METHICILLIN RESISTANT STAPHYLOCOCCUS AUREUS (A)  Final   Report Status 07/10/2019 FINAL  Final   Organism ID, Bacteria METHICILLIN RESISTANT STAPHYLOCOCCUS AUREUS  Final      Susceptibility   Methicillin resistant staphylococcus aureus - MIC*    CIPROFLOXACIN >=8 RESISTANT Resistant     ERYTHROMYCIN >=8 RESISTANT Resistant     GENTAMICIN <=0.5 SENSITIVE Sensitive     OXACILLIN >=4 RESISTANT Resistant     TETRACYCLINE <=1 SENSITIVE Sensitive     VANCOMYCIN 1 SENSITIVE Sensitive     TRIMETH/SULFA <=10 SENSITIVE Sensitive     CLINDAMYCIN >=8 RESISTANT Resistant     RIFAMPIN <=0.5 SENSITIVE Sensitive     Inducible Clindamycin NEGATIVE Sensitive     * METHICILLIN RESISTANT STAPHYLOCOCCUS AUREUS  Blood Culture (routine x 2)     Status: Abnormal   Collection Time: 07/08/19 12:14 AM   Specimen: BLOOD  Result Value Ref Range Status   Specimen Description   Final    BLOOD RIGHT ANTECUBITAL Performed at [facility], [address] [place], [place] [address]    Special Requests   Final    BOTTLES DRAWN AEROBIC AND ANAEROBIC Blood Culture results may not be optimal due to an excessive volume of blood received in culture bottles Performed at [facility], [address] [place], [place] [address]    Culture  Setup Time   Final    GRAM POSITIVE COCCI IN BOTH AEROBIC AND ANAEROBIC BOTTLES CRITICAL VALUE NOTED.  VALUE IS CONSISTENT WITH PREVIOUSLY REPORTED AND CALLED  VALUE. Performed at [facility], [address] [place], [place] [address]    Culture (A)  Final    STAPHYLOCOCCUS AUREUS SUSCEPTIBILITIES PERFORMED ON PREVIOUS CULTURE WITHIN THE LAST 5 DAYS. Performed at [facility], [place] [address] [place], [place] [address]    Report Status 07/10/2019 FINAL  Final  Blood Culture ID Panel (Reflexed)     Status: Abnormal   Collection Time: 07/08/19 12:14 AM  Result Value Ref Range Status   Enterococcus species NOT DETECTED NOT DETECTED Final   Listeria monocytogenes NOT DETECTED NOT DETECTED Final   Staphylococcus species DETECTED (A) NOT DETECTED Final    Comment: CRITICAL RESULT CALLED TO, READ BACK BY AND VERIFIED WITH: CHARLES SHANLEVER AT 5277 ON 07/08/19 SNG    Staphylococcus aureus (BCID) DETECTED (A) NOT DETECTED Final    Comment: Methicillin (oxacillin)-resistant Staphylococcus aureus (MRSA). MRSA is predictably resistant to beta-lactam antibiotics (except ceftaroline). Preferred therapy is vancomycin unless clinically contraindicated. Patient requires contact precautions if  hospitalized. CRITICAL RESULT CALLED TO, READ BACK BY AND VERIFIED WITH: CHARLES SHANLEVER AT 8242 ON 07/08/19 SNG  Methicillin resistance DETECTED (A) NOT DETECTED Final    Comment: CRITICAL RESULT CALLED TO, READ BACK BY AND VERIFIED WITH: CHARLES SHANLEVER AT 1136 ON 07/08/19 SNG    Streptococcus species NOT DETECTED NOT DETECTED Final   Streptococcus agalactiae NOT DETECTED NOT DETECTED Final   Streptococcus pneumoniae NOT DETECTED NOT DETECTED Final   Streptococcus pyogenes NOT DETECTED NOT DETECTED Final   Acinetobacter baumannii NOT DETECTED NOT DETECTED Final   Enterobacteriaceae species NOT DETECTED NOT DETECTED Final   Enterobacter cloacae complex NOT DETECTED NOT DETECTED Final   Escherichia coli NOT DETECTED NOT DETECTED Final   Klebsiella oxytoca NOT DETECTED NOT DETECTED Final   Klebsiella pneumoniae NOT DETECTED NOT DETECTED Final    Proteus species NOT DETECTED NOT DETECTED Final   Serratia marcescens NOT DETECTED NOT DETECTED Final   Haemophilus influenzae NOT DETECTED NOT DETECTED Final   Neisseria meningitidis NOT DETECTED NOT DETECTED Final   Pseudomonas aeruginosa NOT DETECTED NOT DETECTED Final   Candida albicans NOT DETECTED NOT DETECTED Final   Candida glabrata NOT DETECTED NOT DETECTED Final   Candida krusei NOT DETECTED NOT DETECTED Final   Candida parapsilosis NOT DETECTED NOT DETECTED Final   Candida tropicalis NOT DETECTED NOT DETECTED Final    Comment: Performed at [facility], [address] [place], [place] [address]  Respiratory Panel by RT PCR (Flu A&B, Covid) - Nasopharyngeal Swab     Status: None   Collection Time: 07/08/19  1:18 AM   Specimen: Nasopharyngeal Swab  Result Value Ref Range Status   SARS Coronavirus 2 by RT PCR NEGATIVE NEGATIVE Final    Comment: (NOTE) SARS-CoV-2 target nucleic acids are NOT DETECTED. The SARS-CoV-2 RNA is generally detectable in upper respiratoy specimens during the acute phase of infection. The lowest concentration of SARS-CoV-2 viral copies this assay can detect is 131 copies/mL. A negative result does not preclude SARS-Cov-2 infection and should not be used as the sole basis for treatment or other patient management decisions. A negative result may occur with  improper specimen collection/handling, submission of specimen other than nasopharyngeal swab, presence of viral mutation(s) within the areas targeted by this assay, and inadequate number of viral copies (<131 copies/mL). A negative result must be combined with clinical observations, patient history, and epidemiological information. The expected result is Negative. Fact Sheet for Patients:  https://www.moore.com/ Fact Sheet for Healthcare Providers:  https://www.young.biz/ This test is not yet ap proved or cleared by the [place] FDA and  has  been authorized for detection and/or diagnosis of SARS-CoV-2 by FDA under an Emergency Use Authorization (EUA). This EUA will remain  in effect (meaning this test can be used) for the duration of the COVID-19 declaration under Section 564(b)(1) of the Act, 21 U.S.C. section 360bbb-3(b)(1), unless the authorization is terminated or revoked sooner.    Influenza A by PCR NEGATIVE NEGATIVE Final   Influenza B by PCR NEGATIVE NEGATIVE Final    Comment: (NOTE) The Xpert Xpress SARS-CoV-2/FLU/RSV assay is intended as an aid in  the diagnosis of influenza from Nasopharyngeal swab specimens and  should not be used as a sole basis for treatment. Nasal washings and  aspirates are unacceptable for Xpert Xpress SARS-CoV-2/FLU/RSV  testing. Fact Sheet for Patients: https://www.moore.com/ Fact Sheet for Healthcare Providers: https://www.young.biz/ This test is not yet approved or cleared by the [place] FDA and  has been authorized for detection and/or diagnosis of SARS-CoV-2 by  FDA under an Emergency Use Authorization (EUA). This EUA will remain  in  effect (meaning this test can be used) for the duration of the  Covid-19 declaration under Section 564(b)(1) of the Act, 21  U.S.C. section 360bbb-3(b)(1), unless the authorization is  terminated or revoked. Performed at [facility], [address] [place], [place] [address]   MRSA PCR Screening     Status: Abnormal   Collection Time: 07/08/19  7:51 AM   Specimen: Nasal Mucosa; Nasopharyngeal  Result Value Ref Range Status   MRSA by PCR POSITIVE (A) NEGATIVE Final    Comment:        The GeneXpert MRSA Assay (FDA approved for NASAL specimens only), is one component of a comprehensive MRSA colonization surveillance program. It is not intended to diagnose MRSA infection nor to guide or monitor treatment for MRSA infections. RESULT CALLED TO, READ BACK BY AND VERIFIED WITH: VICK GREGORY AT  1305 07/08/19.PMF Performed at [facility], [address] [place], [place] [address]   Culture, blood (Routine X 2) w Reflex to ID Panel     Status: None (Preliminary result)   Collection Time: 07/09/19  2:02 PM   Specimen: BLOOD  Result Value Ref Range Status   Specimen Description BLOOD BLOOD RIGHT HAND  Final   Special Requests   Final    BOTTLES DRAWN AEROBIC AND ANAEROBIC Blood Culture adequate volume   Culture   Final    NO GROWTH 2 DAYS Performed at [facility], [address] [place], [place] [address]    Report Status PENDING  Incomplete  Culture, blood (Routine X 2) w Reflex to ID Panel     Status: None (Preliminary result)   Collection Time: 07/09/19  3:57 PM   Specimen: BLOOD  Result Value Ref Range Status   Specimen Description BLOOD BLOOD RIGHT ARM  Final   Special Requests   Final    BOTTLES DRAWN AEROBIC ONLY Blood Culture adequate volume   Culture   Final    NO GROWTH 2 DAYS Performed at [facility], [address] [place], [place] [address]    Report Status PENDING  Incomplete        BMP Latest Ref Rng & Units 07/11/2019 07/10/2019 07/09/2019  Glucose 70 - 99 mg/dL 449(Q) 759(F) 638(G)  BUN 6 - 20 mg/dL 66(Z) 99(J) 57(S)  Creatinine 0.44 - 1.00 mg/dL 1.77(L) 3.90(Z) 0.09(Q)  BUN/Creat Ratio 9 - 23 - - -  Sodium 135 - 145 mmol/L 141 138 135  Potassium 3.5 - 5.1 mmol/L 3.4(L) 3.8 3.7  Chloride 98 - 111 mmol/L 111 107 104  CO2 22 - 32 mmol/L 21(L) 23 23  Calcium 8.9 - 10.3 mg/dL 8.3(L) 8.0(L) 7.8(L)       Indwelling Urinary Catheter continued, requirement due to   Reason to continue Indwelling Urinary Catheter strict Intake/Output monitoring for hemodynamic instability   Central Line/ continued, requirement due to  Reason to continue [employer] Monitoring of central venous pressure or other hemodynamic parameters and poor IV access      ASSESSMENT AND PLAN SYNOPSIS  SHOCK-SEPSIS due to MRSA BACTEREMIA ?source  complicated by acute toxic metabolic encephalopathy due to septic shock and renal failure -use vasopressors to keep MAP>65 -follow ABG and LA -follow up cultures -emperic ABX -consider stress dose steroids -aggressive IV fluid resuscitation   ACUTE KIDNEY INJURY/Renal Failure -follow chem 7 -follow UO -continue Foley Catheter-assess need -Avoid nephrotoxic agents -Recheck creatinine   POST COVID PNEUMONIA SEQUELA OF ILD Oxygen as needed  NEUROLOGY - intubated and sedated -  minimal sedation to achieve a RASS goal: -1 Wake up assessment pending    CARDIAC ICU monitoring  ID -continue IV abx as prescibed MRSA bacteremia  GI GI PROPHYLAXIS as indicated  NUTRITIONAL STATUS Nutrition Status:         DIET-->NPO Constipation protocol as indicated  ENDO - will use ICU hypoglycemic\Hyperglycemia protocol if indicated   ELECTROLYTES -follow labs as needed -replace as needed -pharmacy consultation and following   DVT/GI PRX ordered TRANSFUSIONS AS NEEDED MONITOR FSBS ASSESS the need for LABS as needed   Critical Care Time devoted to patient care services described in this note is 31 minutes.   Overall, patient is critically ill, prognosis is guarded.    Lucie Leather, M.D.  [facility]  Medical Director [facility] Medical Director [facility] Cardio-Pulmonary Department

## 2019-07-12 DIAGNOSIS — G2581 Restless legs syndrome: Principal | ICD-10-CM

## 2019-07-12 DIAGNOSIS — R252 Cramp and spasm: Principal | ICD-10-CM

## 2019-07-12 LAB — CBC WITH DIFFERENTIAL/PLATELET
Abs Immature Granulocytes: 0.06 10*3/uL (ref 0.00–0.07)
Basophils Absolute: 0 10*3/uL (ref 0.0–0.1)
Basophils Relative: 0 %
Eosinophils Absolute: 0 10*3/uL (ref 0.0–0.5)
Eosinophils Relative: 0 %
HCT: 29.9 % — ABNORMAL LOW (ref 36.0–46.0)
Hemoglobin: 9.2 g/dL — ABNORMAL LOW (ref 12.0–15.0)
Immature Granulocytes: 1 %
Lymphocytes Relative: 11 %
Lymphs Abs: 1.1 10*3/uL (ref 0.7–4.0)
MCH: 23.7 pg — ABNORMAL LOW (ref 26.0–34.0)
MCHC: 30.8 g/dL (ref 30.0–36.0)
MCV: 77.1 fL — ABNORMAL LOW (ref 80.0–100.0)
Monocytes Absolute: 0.5 10*3/uL (ref 0.1–1.0)
Monocytes Relative: 5 %
Neutro Abs: 8.2 10*3/uL — ABNORMAL HIGH (ref 1.7–7.7)
Neutrophils Relative %: 83 %
Platelets: 178 10*3/uL (ref 150–400)
RBC: 3.88 MIL/uL (ref 3.87–5.11)
RDW: 17.2 % — ABNORMAL HIGH (ref 11.5–15.5)
WBC: 9.8 10*3/uL (ref 4.0–10.5)
nRBC: 0 % (ref 0.0–0.2)

## 2019-07-12 LAB — COMPREHENSIVE METABOLIC PANEL
ALT: 39 U/L (ref 0–44)
AST: 29 U/L (ref 15–41)
Albumin: 2.5 g/dL — ABNORMAL LOW (ref 3.5–5.0)
Alkaline Phosphatase: 62 U/L (ref 38–126)
Anion gap: 8 (ref 5–15)
BUN: 32 mg/dL — ABNORMAL HIGH (ref 6–20)
CO2: 23 mmol/L (ref 22–32)
Calcium: 8.4 mg/dL — ABNORMAL LOW (ref 8.9–10.3)
Chloride: 109 mmol/L (ref 98–111)
Creatinine, Ser: 0.79 mg/dL (ref 0.44–1.00)
GFR calc Af Amer: >60 mL/min (ref >60)
GFR calc non Af Amer: >60 mL/min (ref >60)
Glucose, Bld: 161 mg/dL — ABNORMAL HIGH (ref 70–99)
Potassium: 3 mmol/L — ABNORMAL LOW (ref 3.5–5.1)
Sodium: 140 mmol/L (ref 135–145)
Total Bilirubin: 0.9 mg/dL (ref 0.3–1.2)
Total Protein: 6.4 g/dL — ABNORMAL LOW (ref 6.5–8.1)

## 2019-07-12 LAB — PHOSPHORUS: Phosphorus: 3.3 mg/dL (ref 2.5–4.6)

## 2019-07-12 LAB — GLUCOSE, CAPILLARY
Glucose-Capillary: 139 mg/dL — ABNORMAL HIGH (ref 70–99)
Glucose-Capillary: 156 mg/dL — ABNORMAL HIGH (ref 70–99)
Glucose-Capillary: 136 mg/dL — ABNORMAL HIGH (ref 70–99)
Glucose-Capillary: 127 mg/dL — ABNORMAL HIGH (ref 70–99)
Glucose-Capillary: 93 mg/dL (ref 70–99)
Glucose-Capillary: 105 mg/dL — ABNORMAL HIGH (ref 70–99)
Glucose-Capillary: 108 mg/dL — ABNORMAL HIGH (ref 70–99)
Glucose-Capillary: 109 mg/dL — ABNORMAL HIGH (ref 70–99)

## 2019-07-12 LAB — FIBRIN DERIVATIVES D-DIMER (ARMC ONLY): Fibrin derivatives D-dimer (ARMC): 3171.02 ng/mL (FEU) — ABNORMAL HIGH (ref 0.00–499.00)

## 2019-07-12 LAB — VANCOMYCIN, RANDOM: Vancomycin Rm: 17

## 2019-07-12 LAB — MAGNESIUM: Magnesium: 2.2 mg/dL (ref 1.7–2.4)

## 2019-07-12 LAB — VANCOMYCIN, TROUGH: Vancomycin Tr: 31 ug/mL (ref 15–20)

## 2019-07-12 MED ORDER — FAMOTIDINE 20 MG PO TABS
20.0000 mg | ORAL_TABLET | Freq: Two times a day (BID) | ORAL | Status: DC
  Administered 2019-07-12 – 2019-07-17 (×9): 20 mg via ORAL
  Filled 2019-07-12 (×10): qty 1

## 2019-07-12 MED ORDER — SENNOSIDES-DOCUSATE SODIUM 8.6-50 MG PO TABS
2.0000 | ORAL_TABLET | Freq: Two times a day (BID) | ORAL | Status: DC
  Administered 2019-07-12 (×2): 2
  Filled 2019-07-12 (×2): qty 2

## 2019-07-12 MED ORDER — VANCOMYCIN HCL IN DEXTROSE 1-5 GM/200ML-% IV SOLN
1000.0000 mg | Freq: Two times a day (BID) | INTRAVENOUS | Status: DC
  Administered 2019-07-12 – 2019-07-13 (×3): 1000 mg via INTRAVENOUS
  Filled 2019-07-12 (×5): qty 200

## 2019-07-12 MED ORDER — POTASSIUM CHLORIDE CRYS ER 20 MEQ PO TBCR: 40.0000 meq | EXTENDED_RELEASE_TABLET | Freq: Two times a day (BID) | ORAL | Status: DC

## 2019-07-12 MED ORDER — POTASSIUM CHLORIDE CRYS ER 20 MEQ PO TBCR
40.0000 meq | EXTENDED_RELEASE_TABLET | Freq: Two times a day (BID) | ORAL | Status: AC
  Administered 2019-07-12 (×2): 40 meq via ORAL
  Filled 2019-07-12 (×2): qty 2

## 2019-07-12 MED ORDER — LEVOTHYROXINE 88 MCG TABLET
ORAL_TABLET | Freq: Every day | ORAL | 1 refills | 90 days | Status: CP
Start: 2019-07-12 — End: 2020-07-11

## 2019-07-12 NOTE — Progress Notes (Signed)
PHARMACY CONSULT NOTE  Pharmacy Consult for Electrolyte Monitoring and Replacement   Recent Labs: Potassium (mmol/L)  Date Value  07/12/2019 3.0 (L)  05/27/2014 3.8   Magnesium (mg/dL)  Date Value  02/10/8526 2.2  12/02/2013 1.1 (L)   Calcium (mg/dL)  Date Value  78/24/2353 8.4 (L)   Calcium, Total (mg/dL)  Date Value  61/44/3154 8.5   Albumin (g/dL)  Date Value  00/86/7619 2.5 (L)  11/16/2018 4.2  12/03/2013 2.7 (L)   Phosphorus (mg/dL)  Date Value  50/93/2671 3.3   Sodium (mmol/L)  Date Value  07/12/2019 140  11/16/2018 134  05/27/2014 136     Assessment: 59 year old female admitted with difficulty breathing. Patient recently hospitalized 04/06/19 to 05/04/19 post-COVID. Pharmacy consulted for management of electrolytes and glucose control.  Goal of Therapy:  Electrolytes WNL Glucose < 180 (ideally)  Plan:  Electrolytes:  -K 3, potassium 40 mEq PO x 2 (already ordered) -Scr improving, UOP significantly improved yesterday to 3L -LBM 3/19 -Follow-up AM labs tomorrow   Glucose (3/21 Hgb A1C 11.6%):  -At goal (140-180 mg/dL) today -On stress dose steroids -Poor PO intake, on d5w at 50 mL/hr -Continue levemir to 25 units BID per diabetes coordinator recommendations -Continue SSI -On Tresiba and linagliptin PTA   Tressie Ellis  Pharmacy Resident 07/12/2019 7:43 AM

## 2019-07-12 NOTE — Progress Notes (Signed)
PT Cancellation Note  Patient Details Name: [name] [name] MRN: [id number] DOB: [birth year]   Cancelled Treatment:    Reason Eval/Treat Not Completed: Patient not medically ready Spoke with nursing who reports that pt wanted to use the [facility] earlier and that she was a very heavy +2 assist and became completely exhausted with the effort.  States that pt is eager to work with PT but that starting today was probably not going to happen.  She agrees with the plan to hold today and initiate PT tomorrow.   Malachi Pro, DPT 07/12/2019, 1:49 PM

## 2019-07-12 NOTE — Progress Notes (Addendum)
Pharmacy Antibiotic Note  [name] [name] is a 59 y.o. female admitted on 07/07/2019 with sepsis.  Pharmacy has been consulted for Vancomycin dosing. Patient now with MRSA bacteremia. Patient has history of neurogenic bladder with implanted bladder stimulator. Urology stated the infection source is unlikely to be InterStim implant. Ciprofloxacin d/c per CCM. WBC trending down. Tmax 24 hours 98.45F. TTE without evidence of endocarditis. TEE recommended when patient is stable. Repeat Bcx ordered 3/22.   Supratherapeutic concentrations at steady state (after 5th maintenance dose) on:  Vancomycin 1250 mg IV Q 12 hrs. Goal AUC 400-550. 3/23 @ 2100 Vancomycin peak: 44 3/24 @ 0433 Vancomycin trough: 31 Calculated AUC: 914.8 Calculated trough: 29.5  Renal function improved 3/24 with UOP of 3L (1L/day previously). Scr improved 1.18 >> 0.79 today. 3/25 Vancomycin random level: 17 (drawn 8 hours early) when extrapolated implies true trough < 10.  Plan:  Increase vancomycin to:  Vancomycin 1000 mg IV Q 12 hrs. Goal AUC 400-550. Expected AUC: 340.2, trough 9.5 SCr used: 0.80 Weight: IBW  Although kinetics imply subtherapeutic levels, patient was known to be supratherapeutic on 1250 mg q12h yesterday at apparent steady state. Given fluctuating renal function, will re-dose with caution and adjust as indicated based on levels and trend.    Levels in 1-2 days given stable renal function.   Height: 5\' 10"  (177.8 cm) Weight: (!) 350 lb (158.8 kg) IBW/kg (Calculated) : 68.5  Temp (24hrs), Avg:98.4 F (36.9 C), Min:98 F (36.7 C), Max:98.9 F (37.2 C)  Recent Labs  Lab 07/08/19 0014 07/08/19 0014 07/08/19 0257 07/08/19 0702 07/08/19 2131 07/09/19 0420 07/10/19 0412 07/10/19 1033 07/10/19 2100 07/11/19 0433 07/11/19 0440 07/12/19 0345 07/12/19 1051  WBC 17.7*  --   --   --   --  21.7* 18.6*  --   --   --  18.3* 9.8  --   CREATININE 0.90   < >  --   --  1.39* 1.26* 1.15*  --   --   --   1.18* 0.79  --   LATICACIDVEN 4.3*  --  5.1* 4.1*  --   --   --  1.6  --   --   --   --   --   VANCOTROUGH  --   --   --   --   --   --   --   --   --  31*  --   --   --   VANCOPEAK  --   --   --   --   --   --   --   --  21*  --   --   --   --   59  --   --   --   --   --   --   --   --   --   --   --   --  17   < > = values in this interval not displayed.    Estimated Creatinine Clearance: 128.1 mL/min (by C-G formula based on SCr of 0.79 mg/dL).    Allergies  Allergen Reactions  . Cephalexin Hives  . Codeine Palpitations, Nausea Only, Nausea And Vomiting, Rash and Shortness Of Breath    "makes heart fly, she gets flushed and passes out"  . Doxycycline Rash  . Propoxyphene Rash and Shortness Of Breath    Increase heart rate  . Sulfa Antibiotics Palpitations, Nausea Only, Shortness Of Breath and Hives    "  makes heart fly, she gets flushed and passes out"  . Lovenox [Enoxaparin Sodium] Hives  . Hydrocodone Nausea And Vomiting    Hear racing & breaks out into a cold sweat.  . Meropenem Rash    Erythematous, hot, pruritic rash over arms, chest, back, abdomen, and face occurred at the end of meropenem infusion on 02/22/18    Antimicrobials this admission: Aztreonam 3/21 >> 3/21 Ciprofloxacin 3/21>> 3/22 Vancomycin 3/21>>   Dose adjustments this admission: 3/24: Vancomycin 1250 q12h >> vancomycin 1500 q24h 3/25: Vancomycin 1500 q24h >> vancomycin 1000 mg q12h  Microbiology results: 3/22 BCx: NG x 3 days 3/21 BCx: 4/4 MRSA growth 3/21 MRSA PCR: Positive  3/20 UCx: no growth    Thank you for allowing pharmacy to be a part of this patient's care.  [place] Resident 07/12/2019 12:11 PM

## 2019-07-12 NOTE — Progress Notes (Signed)
  PROGRESS NOTE    [name] [name]  VJK:[id number] DOB: April 27, 1960 DOA: 07/07/2019  PCP: [facility]    LOS - 4    Patient admitted to ICU on 3/21 with septic shock found secondary to MRSA bacteremia.  Had severe Covid-19 infection with ARDS back in December, since has been on 2 L/min oxygen.  Patient is now off vasopressors, alert and oriented, following commands.  Vitals today are stable but somewhat borderline BP, on 3 L/min oxygen by nasal cannula.  Prior acute renal failure resolved.  Leukocytosis resolved.  I have reviewed the chart and progress notes by Dr. Belia Heman in detail, and I agree with the assessment and plan as outlined therein. In addition: --PT evaluation tomorrow --continue Vancomycin, ID is following --cardiology consulted for TEE ([facility])   No Charge    Pennie Banter, DO [facility]   If 7PM-7AM, please contact night-coverage [email or web address] 07/12/2019, 3:26 PM

## 2019-07-12 NOTE — Progress Notes (Addendum)
CRITICAL CARE NOTE 75F hx of DM2, HTN, RA, MDD, HFpEF,OSA, BMI >50, s/p COVID19 with severe ARDS, came in febrile hypoxemic with elevated lactate and CT chest abd showing poss pyelo and spenic infarct.MRSA BACTEREMIA   SIGNIFICANT EVENTS/STUDIES: 03/21: Pt admitted to ICU with hypotension secondary to septic shock MRSA BACTEREMIA 03/21: CT Chest/Abd/Pelvisrevealed mixed ground-glass and consolidative opacity seen towards the lung apices bilaterally, poorly assessed given extensive respiratory motion artifact at the time of exam. Differential diagnosis could reflect residual or recurrent infection change in the setting of prior COVID-19. Extensive bilateral perinephric stranding and hazy retroperitoneal attenuation. The appearance is nonspecific, and can be seen with urinary tractinfection/pyelonephritis with minimal thickening of the bladder compatible with cystitis as well. Consider correlation with urinalysis. Questionable area of geographic hypoattenuation affecting the posterior spleen and periphery of the liver, may represent an area of developing splenic infarction. Marked laxity of the anterior abdominal wall with periumbilical postsurgical changes and a fat and simple fluid containing collection to the right of the umbilicus. Borderline enlarged left inguinal lymph nodes are similar to a comparison study 04/15/2013 and likely reactive. Aortic Atherosclerosis 3/22remains on pressors, critically ill 3/37family at bedside updated 3/23remains septic shock, high risk for cardiac arrest and intubation Son at bedside updated 3/24 weaning off pressors, weaning off precedex 3/25 alert and awake, follows commands, off pressors  CC  follow up setic shock  HPI More alert and awake Follows commands Eating without difficulty   BP 104/80   Pulse 68   Temp 98.1 F (36.7 C) (Oral)   Resp 16   Ht 5\' 10"  (1.778 m)   Wt (!) 158.8 kg   LMP 04/20/2001   SpO2 98%   BMI 50.22 kg/m     I/O last 3 completed shifts: In: 4933.2 [P.O.:960; I.V.:1721.3; IV Piggyback:2251.9] Out: 3625 [Urine:3625] Total I/O In: 315.4 [I.V.:265.4; IV Piggyback:50] Out: 700 [Urine:700]  SpO2: 98 % O2 Flow Rate (L/min): 3 L/min FiO2 (%): 40 %  Estimated body mass index is 50.22 kg/m as calculated from the following:   Height as of this encounter: 5\' 10"  (1.778 m).   Weight as of this encounter: 158.8 kg.   Review of Systems:  Gen:  Denies  fever, sweats, chills weight loss  HEENT: Denies blurred vision, double vision, ear pain, eye pain, hearing loss, nose bleeds, sore throat Cardiac:  No dizziness, chest pain or heaviness, chest tightness,edema, No JVD Resp:   No cough, -sputum production, -shortness of breath,-wheezing, -hemoptysis,  Gi: Denies swallowing difficulty, stomach pain, nausea or vomiting, diarrhea,  Other:  All other systems negative   Physical Examination:   General Appearance: No distress  Neuro:without focal findings,  speech normal,  HEENT: PERRLA, EOM intact.   Pulmonary: normal breath sounds, No wheezing.  CardiovascularNormal S1,S2.  No m/r/g.   Abdomen: Benign, Soft, non-tender. Renal:  No costovertebral tenderness  GU:  Not performed at this time. Endoc: No evident thyromegaly Skin:   warm, no rashes, no ecchymosis  Extremities: normal, no cyanosis, clubbing. PSYCHIATRIC: Mood, affect within normal limits.   ALL OTHER ROS ARE NEGATIVE   MEDICATIONS: I have reviewed all medications and confirmed regimen as documented   CULTURE RESULTS   Recent Results (from the past 240 hour(s))  Urine culture     Status: None   Collection Time: 07/07/19 12:14 AM   Specimen: In/Out Cath Urine  Result Value Ref Range Status   Specimen Description   Final    IN/OUT CATH URINE Performed at [facility], [address] [place], [place] [address]    Special Requests   Final    NONE Performed at [facility], [address]  [place], [place] [address]    Culture   Final    NO GROWTH Performed at [facility], [address], [place], [place] [address]    Report Status 07/09/2019 FINAL  Final  Blood Culture (routine x 2)     Status: Abnormal   Collection Time: 07/08/19 12:14 AM   Specimen: BLOOD  Result Value Ref Range Status   Specimen Description   Final    BLOOD LEFT ANTECUBITAL Performed at [facility], [address], [place], [place] [address]    Special Requests   Final    BOTTLES DRAWN AEROBIC AND ANAEROBIC Blood Culture adequate volume Performed at [facility], [address] [place], [place] [address]    Culture  Setup Time   Final    GRAM POSITIVE COCCI IN BOTH AEROBIC AND ANAEROBIC BOTTLES CRITICAL RESULT CALLED TO, READ BACK BY AND VERIFIED WITH: ABBY ELLINGTON AT 6222 ON 07/08/19 SNG Performed at [facility], [address] [place], [place] [address]    Culture METHICILLIN RESISTANT STAPHYLOCOCCUS AUREUS (A)  Final   Report Status 07/10/2019 FINAL  Final   Organism ID, Bacteria METHICILLIN RESISTANT STAPHYLOCOCCUS AUREUS  Final      Susceptibility   Methicillin resistant staphylococcus aureus - MIC*    CIPROFLOXACIN >=8 RESISTANT Resistant     ERYTHROMYCIN >=8 RESISTANT Resistant     GENTAMICIN <=0.5 SENSITIVE Sensitive     OXACILLIN >=4 RESISTANT Resistant     TETRACYCLINE <=1 SENSITIVE Sensitive     VANCOMYCIN 1 SENSITIVE Sensitive     TRIMETH/SULFA <=10 SENSITIVE Sensitive     CLINDAMYCIN >=8 RESISTANT Resistant     RIFAMPIN <=0.5 SENSITIVE Sensitive     Inducible Clindamycin NEGATIVE Sensitive     * METHICILLIN RESISTANT STAPHYLOCOCCUS AUREUS  Blood Culture (routine x 2)     Status: Abnormal   Collection Time: 07/08/19 12:14 AM   Specimen: BLOOD  Result Value Ref Range Status   Specimen Description   Final    BLOOD RIGHT ANTECUBITAL Performed at [facility], [address] [place], [place] [address]    Special Requests   Final     BOTTLES DRAWN AEROBIC AND ANAEROBIC Blood Culture results may not be optimal due to an excessive volume of blood received in culture bottles Performed at [facility], [address] [place], [place] [address]    Culture  Setup Time   Final    GRAM POSITIVE COCCI IN BOTH AEROBIC AND ANAEROBIC BOTTLES CRITICAL VALUE NOTED.  VALUE IS CONSISTENT WITH PREVIOUSLY REPORTED AND CALLED VALUE. Performed at [facility], [address] [place], [place] [address]    Culture (A)  Final    STAPHYLOCOCCUS AUREUS SUSCEPTIBILITIES PERFORMED ON PREVIOUS CULTURE WITHIN THE LAST 5 DAYS. Performed at [facility], [address], [place], [place] [address]    Report Status 07/10/2019 FINAL  Final  Blood Culture ID Panel (Reflexed)     Status: Abnormal   Collection Time: 07/08/19 12:14 AM  Result Value Ref Range Status   Enterococcus species NOT DETECTED NOT DETECTED Final   Listeria monocytogenes NOT DETECTED NOT DETECTED Final   Staphylococcus species DETECTED (A) NOT DETECTED Final    Comment: CRITICAL RESULT CALLED TO, READ BACK BY AND VERIFIED WITH: CHARLES SHANLEVER AT 1136 ON  07/08/19 SNG    Staphylococcus aureus (BCID) DETECTED (A) NOT DETECTED Final    Comment: Methicillin (oxacillin)-resistant Staphylococcus aureus (MRSA). MRSA is predictably resistant to beta-lactam antibiotics (except ceftaroline). Preferred therapy is vancomycin unless clinically contraindicated. Patient requires contact precautions if  hospitalized. CRITICAL RESULT CALLED TO, READ BACK BY AND VERIFIED WITH: CHARLES SHANLEVER AT 8657 ON 07/08/19 SNG    Methicillin resistance DETECTED (A) NOT DETECTED Final    Comment: CRITICAL RESULT CALLED TO, READ BACK BY AND VERIFIED WITH: CHARLES SHANLEVER AT 8469 ON 07/08/19 SNG    Streptococcus species NOT DETECTED NOT DETECTED Final   Streptococcus agalactiae NOT DETECTED NOT DETECTED Final   Streptococcus pneumoniae NOT DETECTED NOT DETECTED Final    Streptococcus pyogenes NOT DETECTED NOT DETECTED Final   Acinetobacter baumannii NOT DETECTED NOT DETECTED Final   Enterobacteriaceae species NOT DETECTED NOT DETECTED Final   Enterobacter cloacae complex NOT DETECTED NOT DETECTED Final   Escherichia coli NOT DETECTED NOT DETECTED Final   Klebsiella oxytoca NOT DETECTED NOT DETECTED Final   Klebsiella pneumoniae NOT DETECTED NOT DETECTED Final   Proteus species NOT DETECTED NOT DETECTED Final   Serratia marcescens NOT DETECTED NOT DETECTED Final   Haemophilus influenzae NOT DETECTED NOT DETECTED Final   Neisseria meningitidis NOT DETECTED NOT DETECTED Final   Pseudomonas aeruginosa NOT DETECTED NOT DETECTED Final   Candida albicans NOT DETECTED NOT DETECTED Final   Candida glabrata NOT DETECTED NOT DETECTED Final   Candida krusei NOT DETECTED NOT DETECTED Final   Candida parapsilosis NOT DETECTED NOT DETECTED Final   Candida tropicalis NOT DETECTED NOT DETECTED Final    Comment: Performed at [facility], [address] [place], [place] [address]  Respiratory Panel by RT PCR (Flu A&B, Covid) - Nasopharyngeal Swab     Status: None   Collection Time: 07/08/19  1:18 AM   Specimen: Nasopharyngeal Swab  Result Value Ref Range Status   SARS Coronavirus 2 by RT PCR NEGATIVE NEGATIVE Final    Comment: (NOTE) SARS-CoV-2 target nucleic acids are NOT DETECTED. The SARS-CoV-2 RNA is generally detectable in upper respiratoy specimens during the acute phase of infection. The lowest concentration of SARS-CoV-2 viral copies this assay can detect is 131 copies/mL. A negative result does not preclude SARS-Cov-2 infection and should not be used as the sole basis for treatment or other patient management decisions. A negative result may occur with  improper specimen collection/handling, submission of specimen other than nasopharyngeal swab, presence of viral mutation(s) within the areas targeted by this assay, and inadequate number of viral  copies (<131 copies/mL). A negative result must be combined with clinical observations, patient history, and epidemiological information. The expected result is Negative. Fact Sheet for Patients:  PinkCheek.be Fact Sheet for Healthcare Providers:  GravelBags.it This test is not yet ap proved or cleared by the [place] FDA and  has been authorized for detection and/or diagnosis of SARS-CoV-2 by FDA under an Emergency Use Authorization (EUA). This EUA will remain  in effect (meaning this test can be used) for the duration of the COVID-19 declaration under Section 564(b)(1) of the Act, 21 U.S.C. section 360bbb-3(b)(1), unless the authorization is terminated or revoked sooner.    Influenza A by PCR NEGATIVE NEGATIVE Final   Influenza B by PCR NEGATIVE NEGATIVE Final    Comment: (NOTE) The Xpert Xpress SARS-CoV-2/FLU/RSV assay is intended as an aid in  the diagnosis of influenza from Nasopharyngeal swab specimens and  should not be used as a sole basis  for treatment. Nasal washings and  aspirates are unacceptable for Xpert Xpress SARS-CoV-2/FLU/RSV  testing. Fact Sheet for Patients: https://www.moore.com/ Fact Sheet for Healthcare Providers: https://www.young.biz/ This test is not yet approved or cleared by the [place] FDA and  has been authorized for detection and/or diagnosis of SARS-CoV-2 by  FDA under an Emergency Use Authorization (EUA). This EUA will remain  in effect (meaning this test can be used) for the duration of the  Covid-19 declaration under Section 564(b)(1) of the Act, 21  U.S.C. section 360bbb-3(b)(1), unless the authorization is  terminated or revoked. Performed at [facility], [address] [place], [place] [address]   MRSA PCR Screening     Status: Abnormal   Collection Time: 07/08/19  7:51 AM   Specimen: Nasal Mucosa; Nasopharyngeal  Result  Value Ref Range Status   MRSA by PCR POSITIVE (A) NEGATIVE Final    Comment:        The GeneXpert MRSA Assay (FDA approved for NASAL specimens only), is one component of a comprehensive MRSA colonization surveillance program. It is not intended to diagnose MRSA infection nor to guide or monitor treatment for MRSA infections. RESULT CALLED TO, READ BACK BY AND VERIFIED WITH: VICK GREGORY AT 1305 07/08/19.PMF Performed at [facility], [address] [place], [place] [address]   Culture, blood (Routine X 2) w Reflex to ID Panel     Status: None (Preliminary result)   Collection Time: 07/09/19  2:02 PM   Specimen: BLOOD  Result Value Ref Range Status   Specimen Description BLOOD BLOOD RIGHT HAND  Final   Special Requests   Final    BOTTLES DRAWN AEROBIC AND ANAEROBIC Blood Culture adequate volume   Culture   Final    NO GROWTH 3 DAYS Performed at [facility], [address], [place], [place] [address]    Report Status PENDING  Incomplete  Culture, blood (Routine X 2) w Reflex to ID Panel     Status: None (Preliminary result)   Collection Time: 07/09/19  3:57 PM   Specimen: BLOOD  Result Value Ref Range Status   Specimen Description BLOOD BLOOD RIGHT ARM  Final   Special Requests   Final    BOTTLES DRAWN AEROBIC ONLY Blood Culture adequate volume   Culture   Final    NO GROWTH 3 DAYS Performed at [facility], [address], [place], [place] [address]    Report Status PENDING  Incomplete            ASSESSMENT AND PLAN SYNOPSIS   MRSA BACTEREMIA Follow up ID recs Sepsis resolving  SHOCK--resolving   ACUTE KIDNEY INJURY/Renal Failure -follow chem 7 -follow UO  NEUROLOGY Encephalopathy resolved    CARDIAC-cardiology consulted for TEE Assess for vegetations  ID -continue IV abx as prescibed -follow up cultures  GI GI PROPHYLAXIS as indicated  NUTRITIONAL STATUS Nutrition Status: Nutrition Problem: Inadequate oral  intake Etiology: lethargy/confusion Signs/Symptoms: meal completion < 50% Interventions: Refer to RD note for recommendations   DIET--> as tolerated Constipation protocol as indicated  ENDO - will use ICU hypoglycemic\Hyperglycemia protocol if indicated   ELECTROLYTES -follow labs as needed -replace as needed -pharmacy consultation and following   DVT/GI PRX ordered TRANSFUSIONS AS NEEDED MONITOR FSBS ASSESS the need for LABS as needed    Lucie Leather, M.D.  [facility]  Medical Director [facility] Medical Director [facility] Cardio-Pulmonary Department

## 2019-07-13 ENCOUNTER — Inpatient Hospital Stay: Payer: Medicaid Other

## 2019-07-13 DIAGNOSIS — F329 Major depressive disorder, single episode, unspecified: Secondary | ICD-10-CM

## 2019-07-13 DIAGNOSIS — F419 Anxiety disorder, unspecified: Secondary | ICD-10-CM

## 2019-07-13 DIAGNOSIS — R112 Nausea with vomiting, unspecified: Secondary | ICD-10-CM

## 2019-07-13 DIAGNOSIS — A4102 Sepsis due to Methicillin resistant Staphylococcus aureus: Principal | ICD-10-CM

## 2019-07-13 DIAGNOSIS — G894 Chronic pain syndrome: Secondary | ICD-10-CM

## 2019-07-13 DIAGNOSIS — D735 Infarction of spleen: Secondary | ICD-10-CM

## 2019-07-13 DIAGNOSIS — K567 Ileus, unspecified: Secondary | ICD-10-CM

## 2019-07-13 LAB — CBC WITH DIFFERENTIAL/PLATELET
Abs Immature Granulocytes: 0.09 10*3/uL — ABNORMAL HIGH (ref 0.00–0.07)
Basophils Absolute: 0 10*3/uL (ref 0.0–0.1)
Basophils Relative: 0 %
Eosinophils Absolute: 0 10*3/uL (ref 0.0–0.5)
Eosinophils Relative: 0 %
HCT: 30.9 % — ABNORMAL LOW (ref 36.0–46.0)
Hemoglobin: 9.3 g/dL — ABNORMAL LOW (ref 12.0–15.0)
Immature Granulocytes: 1 %
Lymphocytes Relative: 12 %
Lymphs Abs: 1.3 10*3/uL (ref 0.7–4.0)
MCH: 23.8 pg — ABNORMAL LOW (ref 26.0–34.0)
MCHC: 30.1 g/dL (ref 30.0–36.0)
MCV: 79 fL — ABNORMAL LOW (ref 80.0–100.0)
Monocytes Absolute: 0.8 10*3/uL (ref 0.1–1.0)
Monocytes Relative: 7 %
Neutro Abs: 8.2 10*3/uL — ABNORMAL HIGH (ref 1.7–7.7)
Neutrophils Relative %: 80 %
Platelets: 209 10*3/uL (ref 150–400)
RBC: 3.91 MIL/uL (ref 3.87–5.11)
RDW: 17.2 % — ABNORMAL HIGH (ref 11.5–15.5)
WBC: 10.4 10*3/uL (ref 4.0–10.5)
nRBC: 0 % (ref 0.0–0.2)

## 2019-07-13 LAB — BASIC METABOLIC PANEL
Anion gap: 7 (ref 5–15)
BUN: 26 mg/dL — ABNORMAL HIGH (ref 6–20)
CO2: 24 mmol/L (ref 22–32)
Calcium: 8.1 mg/dL — ABNORMAL LOW (ref 8.9–10.3)
Chloride: 110 mmol/L (ref 98–111)
Creatinine, Ser: 0.66 mg/dL (ref 0.44–1.00)
GFR calc Af Amer: >60 mL/min (ref >60)
GFR calc non Af Amer: >60 mL/min (ref >60)
Glucose, Bld: 92 mg/dL (ref 70–99)
Potassium: 2.9 mmol/L — ABNORMAL LOW (ref 3.5–5.1)
Sodium: 141 mmol/L (ref 135–145)

## 2019-07-13 LAB — FIBRIN DERIVATIVES D-DIMER (ARMC ONLY): Fibrin derivatives D-dimer (ARMC): 4143.99 ng/mL (FEU) — ABNORMAL HIGH (ref 0.00–499.00)

## 2019-07-13 LAB — MAGNESIUM: Magnesium: 2 mg/dL (ref 1.7–2.4)

## 2019-07-13 LAB — GLUCOSE, CAPILLARY
Glucose-Capillary: 145 mg/dL — ABNORMAL HIGH (ref 70–99)
Glucose-Capillary: 196 mg/dL — ABNORMAL HIGH (ref 70–99)
Glucose-Capillary: 203 mg/dL — ABNORMAL HIGH (ref 70–99)
Glucose-Capillary: 81 mg/dL (ref 70–99)
Glucose-Capillary: 83 mg/dL (ref 70–99)
Glucose-Capillary: 96 mg/dL (ref 70–99)

## 2019-07-13 LAB — VANCOMYCIN, PEAK: Vancomycin Pk: 31 ug/mL (ref 30–40)

## 2019-07-13 LAB — PHOSPHORUS: Phosphorus: 2.6 mg/dL (ref 2.5–4.6)

## 2019-07-13 LAB — VANCOMYCIN, TROUGH
Vancomycin Tr: 27 ug/mL (ref 15–20)
Vancomycin Tr: 17 ug/mL (ref 15–20)

## 2019-07-13 MED ORDER — AMIODARONE HCL IN DEXTROSE 360-4.14 MG/200ML-% IV SOLN: 30.0000 mg/h | INTRAVENOUS | Status: DC

## 2019-07-13 MED ORDER — ONDANSETRON HCL 4 MG/2ML IJ SOLN
4.0000 mg | Freq: Three times a day (TID) | INTRAMUSCULAR | Status: DC | PRN
  Administered 2019-07-13: 4 mg via INTRAVENOUS
  Filled 2019-07-13: qty 2

## 2019-07-13 MED ORDER — AMIODARONE LOAD VIA INFUSION
150.0000 mg | Freq: Once | INTRAVENOUS | Status: AC
  Administered 2019-07-13: 150 mg via INTRAVENOUS
  Filled 2019-07-13: qty 83.34

## 2019-07-13 MED ORDER — METHYLPREDNISOLONE SODIUM SUCC 40 MG IJ SOLR
40.0000 mg | Freq: Every day | INTRAMUSCULAR | Status: DC
  Administered 2019-07-13 – 2019-07-17 (×5): 40 mg via INTRAVENOUS
  Filled 2019-07-13 (×5): qty 1

## 2019-07-13 MED ORDER — PROMETHAZINE HCL 25 MG/ML IJ SOLN
12.5000 mg | Freq: Three times a day (TID) | INTRAMUSCULAR | Status: DC | PRN
  Administered 2019-07-13: 12.5 mg via INTRAVENOUS
  Filled 2019-07-13: qty 1

## 2019-07-13 MED ORDER — HEPARIN (PORCINE) 25000 UT/250ML-% IV SOLN
1900.0000 [IU]/h | INTRAVENOUS | Status: DC
  Administered 2019-07-13: 1600 [IU]/h via INTRAVENOUS
  Administered 2019-07-14: 1700 [IU]/h via INTRAVENOUS
  Administered 2019-07-14 – 2019-07-16 (×3): 1900 [IU]/h via INTRAVENOUS
  Filled 2019-07-13 (×6): qty 250

## 2019-07-13 MED ORDER — AMIODARONE HCL IN DEXTROSE 360-4.14 MG/200ML-% IV SOLN
60.0000 mg/h | INTRAVENOUS | Status: DC
  Administered 2019-07-14 (×2): 60 mg/h via INTRAVENOUS
  Filled 2019-07-13 (×2): qty 200

## 2019-07-13 MED ORDER — METOPROLOL TARTRATE 5 MG/5ML IV SOLN
5.0000 mg | INTRAVENOUS | Status: AC
  Administered 2019-07-13: 5 mg via INTRAVENOUS

## 2019-07-13 MED ORDER — INSULIN DETEMIR 100 UNIT/ML ~~LOC~~ SOLN
22.0000 [IU] | Freq: Two times a day (BID) | SUBCUTANEOUS | Status: DC
  Administered 2019-07-13 – 2019-07-17 (×8): 22 [IU] via SUBCUTANEOUS
  Filled 2019-07-13 (×10): qty 0.22

## 2019-07-13 MED ORDER — METOPROLOL TARTRATE 5 MG/5ML IV SOLN
INTRAVENOUS | Status: AC
  Filled 2019-07-13: qty 5

## 2019-07-13 MED ORDER — K PHOS MONO-SOD PHOS DI & MONO 155-852-130 MG PO TABS
250.0000 mg | ORAL_TABLET | Freq: Two times a day (BID) | ORAL | Status: AC
  Administered 2019-07-13 – 2019-07-14 (×3): 250 mg via ORAL
  Filled 2019-07-13 (×4): qty 1

## 2019-07-13 MED ORDER — VANCOMYCIN HCL 750 MG/150ML IV SOLN
750.0000 mg | Freq: Two times a day (BID) | INTRAVENOUS | Status: DC
  Administered 2019-07-13 – 2019-07-14 (×3): 750 mg via INTRAVENOUS
  Filled 2019-07-13 (×5): qty 150

## 2019-07-13 MED ORDER — SENNOSIDES-DOCUSATE SODIUM 8.6-50 MG PO TABS: 1.0000 | ORAL_TABLET | Freq: Two times a day (BID) | ORAL | Status: DC

## 2019-07-13 MED ORDER — POTASSIUM CHLORIDE CRYS ER 20 MEQ PO TBCR
40.0000 meq | EXTENDED_RELEASE_TABLET | ORAL | Status: AC
  Administered 2019-07-13 (×2): 40 meq via ORAL
  Filled 2019-07-13: qty 2

## 2019-07-13 NOTE — Progress Notes (Addendum)
Pt went into afib RVR around 2220. EKG obtained and B. Jon Billings NP notified. Pt has not complaints of chest pain just abdominal pain. Will cont to monitor

## 2019-07-13 NOTE — Progress Notes (Signed)
Pharmacy Antibiotic Note  [name] [name] is a 59 y.o. female admitted on 07/07/2019 with sepsis.  Pharmacy has been consulted for Vancomycin dosing. Patient now with MRSA bacteremia. Patient has history of neurogenic bladder with implanted bladder stimulator. Urology stated the infection source is unlikely to be InterStim implant. Ciprofloxacin d/c per CCM. WBC trending down. Tmax 24 hours 98.60F. TTE without evidence of endocarditis. TEE recommended when patient is stable. Repeat Bcx ordered 3/22.   3/26 Vancomycin peak: 31 (drawn 2 hours after end of infusion) 3/26 Vancomycin trough: 27 (drawn 6 hours early)  Plan: Vancomycin 1000 mg IV Q 12 hrs. Goal AUC 400-550. Expected AUC: 662.9 Expected Cmin: 22.2  Second level may have been drawn too early to accurately predict clearance. Will obtain true trough this evening and then decide.   Renal function continues to improve. Scr improved 1.18 >> 0.79 >>0.66 today.  Height: 5\' 10"  (177.8 cm) Weight: (!) 350 lb (158.8 kg) IBW/kg (Calculated) : 68.5  Temp (24hrs), Avg:98.2 F (36.8 C), Min:97.7 F (36.5 C), Max:98.5 F (36.9 C)  Recent Labs  Lab  0000 07/08/19 0014 07/08/19 0257 07/08/19 0702 07/08/19 2131 07/09/19 0420 07/10/19 0412 07/10/19 1033 07/10/19 2100 07/11/19 0433 07/11/19 0440 07/12/19 0345 07/12/19 1051 07/13/19 0406 07/13/19 1147  WBC   < > 17.7*  --   --   --  21.7* 18.6*  --   --   --  18.3* 9.8  --  10.4  --   CREATININE  --  0.90  --   --    < > 1.26* 1.15*  --   --   --  1.18* 0.79  --  0.66  --   LATICACIDVEN  --  4.3* 5.1* 4.1*  --   --   --  1.6  --   --   --   --   --   --   --   VANCOTROUGH  --   --   --   --   --   --   --   --   --  31*  --   --   --   --   --   VANCOPEAK  --   --   --   --   --   --   --   --  35*  --   --   --   --   --  31  VANCORANDOM  --   --   --   --   --   --   --   --   --   --   --   --  17  --   --    < > = values in this interval not displayed.    Estimated Creatinine  Clearance: 128.1 mL/min (by C-G formula based on SCr of 0.66 mg/dL).    Allergies  Allergen Reactions  . Cephalexin Hives  . Codeine Palpitations, Nausea Only, Nausea And Vomiting, Rash and Shortness Of Breath    "makes heart fly, she gets flushed and passes out"  . Doxycycline Rash  . Propoxyphene Rash and Shortness Of Breath    Increase heart rate  . Sulfa Antibiotics Palpitations, Nausea Only, Shortness Of Breath and Hives    "makes heart fly, she gets flushed and passes out"  . Lovenox [Enoxaparin Sodium] Hives  . Hydrocodone Nausea And Vomiting    Hear racing & breaks out into a cold sweat.  . Meropenem Rash  Erythematous, hot, pruritic rash over arms, chest, back, abdomen, and face occurred at the end of meropenem infusion on 02/22/18    Antimicrobials this admission: Aztreonam 3/21 >> 3/21 Ciprofloxacin 3/21>> 3/22 Vancomycin 3/21>>   Dose adjustments this admission: 3/24: Vancomycin 1250 q12h >> vancomycin 1500 q24h 3/25: Vancomycin 1500 q24h >> vancomycin 1000 mg q12h  Microbiology results: 3/22 BCx: NG x 4 days 3/21 BCx: 4/4 MRSA growth 3/21 MRSA PCR: Positive  3/20 UCx: no growth    Thank you for allowing pharmacy to be a part of this patient's care.  Tressie Ellis  Pharmacy Resident 07/13/2019 3:24 PM

## 2019-07-13 NOTE — Hospital Course (Signed)
07/13/2019 - new onset atrial fib with RVR - started on amio infusion

## 2019-07-13 NOTE — Progress Notes (Signed)
Inpatient Diabetes Program Recommendations  AACE/ADA: New Consensus Statement on Inpatient Glycemic Control (2015)  Target Ranges:  Prepandial:   less than 140 mg/dL      Peak postprandial:   less than 180 mg/dL (1-2 hours)      Critically ill patients:  140 - 180 mg/dL   Lab Results  Component Value Date   GLUCAP 96 07/13/2019   HGBA1C 11.6 (H) 07/08/2019    Review of Glycemic Control Results for JOELI, FENNER (MRN [id number]) as of 07/13/2019 12:00  Ref. Range 07/12/2019 20:19 07/12/2019 23:15 07/13/2019 03:48 07/13/2019 07:24 07/13/2019 11:29  Glucose-Capillary Latest Ref Range: 70 - 99 mg/dL 250 (H) 539 (H) 83 81 96  Diabetes history:DM2 Outpatient Diabetes medications:Tresiba 65-100 units daily, Tradjenta 5 mg daily Current orders for Inpatient glycemic control:Levemir 25 unitsBID, Novolog 0-20 units Q4H; Solumedrol 40 mg daily  Inpatient Diabetes Program Recommendations:  CBG's trending <100 mg/dL.  Consider slight reduction in Levemir to 22 units bid.   Thanks  Beryl Meager, RN, BC-ADM Inpatient Diabetes Coordinator Pager [phone] (8a-5p)

## 2019-07-13 NOTE — Consult Note (Signed)
Cardiology Consultation:   Patient ID: [name] [name] MRN: [id number]; DOB: [birth year]  Admit date: 07/07/2019 Date of Consult: 07/13/2019  Primary Care Provider: [facility], [place] [facility] Primary Cardiologist: new to [facility] Physician requesting consult: Dr. Belia Heman Reason for consult: MRSA bacteremia, need for TEE   Patient Profile:   [name] [name] is a 59 y.o. female with a hx of hypertension, diabetes type 2, morbid obesity, depression/anxiety, diastolic CHF, December 2020 with ARDS secondary to COVID-19 pneumonia, presenting to the hospital with body ache, shortness of breath, confusion, respiratory distress, somnolence  History of Present Illness:   [name] [name] arrived to the emergency room July 13, 2019 with abdominal pain, shortness of breath, confusion Recent history of Covid infection Temperature 101.1, pulse 123, blood sugar 400s, negative Covid and flu CT scan abdomen pelvis showing perinephric stranding, groundglass and consolidative opacities in the lung Code sepsis called, Drop event pressure in the emergency room into the 60s and 70s systolic Started on Levophed, broad-spectrum antibiotics, transferred to the ICU Cultures growing MRSA bacteremia, all 4 bottles -Treated with IV steroids starting July 08, 2019, albumin, vancomycin  Has hx MRSA in past associated with bladder stimulator in 2011.  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 Abdominal wall hernia 01/29/2013  . Anxiety   . Arthritis    Rheumatoid  . C. difficile colitis   . Chronic diastolic heart failure (HCC)   . COVID-19 03/23/2019   Diagnosed at [facility] (send-out) on 03/23/2019  . Depression   . Diabetes mellitus    states no meds or diet restrictions  at present  . Diastolic CHF (HCC)   . Esophagitis   . Fluid retention   . GERD (gastroesophageal reflux disease)   . Hiatal hernia   . Hypertension   . Hypokalemia due to loss of potassium 10/21/2015   Overview:  Associated with 3 weeks of diarrhea  And QT prolongation.  . Hypothyroidism   . IBS (irritable bowel syndrome)   . Moderate episode of recurrent major depressive disorder (HCC) 06/03/2004  . Morbid obesity (HCC)   . MRSA (methicillin resistant Staphylococcus aureus) infection 11/2017   left inner thigh abcess  . Neurogenic bladder    has pacemaker  . Neuropathy   . Obesity   . Panic attacks   . Pneumonia due to COVID-19 virus   . Rheumatoid arthritis (HCC)   . Sleep apnea    STATES SEVERE, CANT TOLERATE MASK- LAST STUDY YEARS AGO    Past Surgical History:  Procedure Laterality Date  . ABDOMINAL HYSTERECTOMY    . CHOLECYSTECTOMY    . DG GREAT TOE RIGHT FOOT  02/23/2018  . EYE SURGERY     bilateral cataract extraction with IOL  . HERNIA REPAIR     ventral hernia with strangulation  . LAPAROSCOPIC GASTRIC BANDING  03/20/07  . TONSILLECTOMY    . TUBAL LIGATION       Home Medications:  Prior to Admission medications   Medication Sig Start Date End Date Taking? Authorizing Provider  ALPRAZolam Prudy Feeler) 0.5 MG tablet Take 1 tablet (0.5 mg total) by mouth 2 (two) times daily as needed for anxiety. 05/04/19  Yes Rolly Salter, MD  ARIPiprazole (ABILIFY) 5 MG tablet TAKE 1 TABLET BY MOUTH ONCE DAILY. Patient taking differently: Take 5 mg by mouth daily.  02/22/19  Yes Clapacs, Jackquline Denmark, MD  atorvastatin (LIPITOR) 80 MG tablet Take 80 mg by mouth daily at  6 PM.  02/09/13  Yes [provider]  benzonatate (TESSALON PERLES) 100 MG capsule Take 1 capsule (100 mg total) by mouth 3 (three) times daily as needed for cough. 05/23/19 05/22/20 Yes Willy Eddy, MD  budesonide (PULMICORT) 0.5 MG/2ML nebulizer solution Take 2 mLs (0.5 mg total) by nebulization 2 (two) times daily. 06/04/19 06/03/20 Yes Salena Saner, MD  busPIRone (BUSPAR) 10 MG tablet TAKE 1 TABLET BY MOUTH TWICE DAILY Patient taking differently: Take 10 mg by mouth 2 (two) times daily.  02/05/19  Yes Clapacs,  Jackquline Denmark, MD  Cholecalciferol (VITAMIN D3) 125 MCG (5000 UT) CAPS Take 1 capsule (5,000 Units total) by mouth daily with breakfast. Take along with calcium and magnesium. 02/14/19 02/14/20 Yes Delano Metz, MD  Ensure Max Protein (ENSURE MAX PROTEIN) LIQD Take 330 mLs (11 oz total) by mouth 2 (two) times daily between meals. 05/04/19  Yes Rolly Salter, MD  famotidine (PEPCID) 20 MG tablet Take 1 tablet (20 mg total) by mouth daily. 05/05/19  Yes Rolly Salter, MD  FLUoxetine (PROZAC) 20 MG capsule Take 1 capsule (20 mg total) by mouth daily. TAKE (1) CAPSULE BY MOUTH ONCE DAILY. Patient taking differently: Take 20 mg by mouth daily.  05/24/19  Yes Clapacs, Jackquline Denmark, MD  folic acid (FOLVITE) 1 MG tablet Take 1 mg by mouth daily.    Yes [provider]  furosemide (LASIX) 20 MG tablet Take 1 tablet (20 mg total) by mouth daily as needed for fluid or edema (weight gain >3 lbs in 1 day or >5 lbs in 2 days). 05/04/19 05/03/20 Yes Rolly Salter, MD  [facility] CALCIUM CITRATE+D MAXIMUM 315-250 MG-UNIT TABS Take 1 tablet by mouth daily. 06/15/19  Yes [provider]  Insulin Degludec (TRESIBA FLEXTOUCH) 200 UNIT/ML SOPN Inject 65-100 Units into the skin daily.   Yes [provider]  insulin lispro (HUMALOG) 100 UNIT/ML injection Inject 20 Units into the skin 3 (three) times daily before meals.   Yes [provider]  levothyroxine (SYNTHROID, LEVOTHROID) 88 MCG tablet Take 88 mcg by mouth daily before breakfast.   Yes [provider]  linagliptin (TRADJENTA) 5 MG TABS tablet Take 1 tablet (5 mg total) by mouth daily. 05/05/19  Yes Rolly Salter, MD  methotrexate 2.5 MG tablet Take 10 mg by mouth every Wednesday.  06/15/19  Yes [provider]  polyethylene glycol (MIRALAX / GLYCOLAX) 17 g packet Take 17 g by mouth daily as needed for mild constipation. 05/04/19  Yes Rolly Salter, MD  pramipexole (MIRAPEX) 0.125 MG tablet Take 0.25 mg by mouth at bedtime.     Yes [provider]  pregabalin (LYRICA) 150 MG capsule Take 1 capsule (150 mg total) by mouth 3 (three) times daily. 02/14/19 08/13/19 Yes Delano Metz, MD  QUEtiapine (SEROQUEL) 300 MG tablet TAKE ONE TABLET BY MOUTH AT BEDTIME. Patient taking differently: Take 300 mg by mouth at bedtime. TAKE ONE TABLET BY MOUTH AT BEDTIME. 02/05/19  Yes Clapacs, Jackquline Denmark, MD  VESICARE 5 MG tablet Take 5 mg by mouth daily. 03/16/19  Yes [provider]  zinc sulfate 220 (50 Zn) MG capsule Take 1 capsule (220 mg total) by mouth daily. 05/05/19  Yes Rolly Salter, MD  zolpidem (AMBIEN) 5 MG tablet Take 1 tablet (5 mg total) by mouth at bedtime. TAKE 1 TABLET BY MOUTH EVERYDAY AT BEDTIME Patient taking differently: Take 5 mg by mouth at bedtime.  05/24/19  Yes Clapacs, Jonny Ruiz  T, MD  ascorbic acid (VITAMIN C) 500 MG tablet Take 1 tablet (500 mg total) by mouth daily. Patient not taking: Reported on 07/11/2019 05/05/19   Rolly Salter, MD  ciclesonide (ALVESCO) 160 MCG/ACT inhaler Inhale 4 puffs into the lungs 2 (two) times daily. Patient not taking: Reported on 07/11/2019 05/04/19   Rolly Salter, MD  dexamethasone (DECADRON) 1 MG tablet Take 2 mg daily for 3days,Take 1mg  daily for 3 days, Take 1mg  every other day for 3days,then stop Patient not taking: Reported on 07/11/2019 05/04/19   Rolly Salter, MD  ipratropium-albuterol (DUONEB) 0.5-2.5 (3) MG/3ML SOLN Take 3 mLs by nebulization 3 (three) times daily. Patient not taking: Reported on 07/11/2019 05/04/19   Rolly Salter, MD  oxyCODONE (OXY IR/ROXICODONE) 5 MG immediate release tablet Take 1 tablet (5 mg total) by mouth every 6 (six) hours as needed for severe pain. Must last 30 days 04/15/19 05/15/19  Delano Metz, MD    Inpatient Medications: Scheduled Meds: . busPIRone  10 mg Oral BID  . Chlorhexidine Gluconate Cloth  6 each Topical Q0600  . famotidine  20 mg Oral BID  . heparin injection (subcutaneous)  5,000 Units Subcutaneous  Q8H  . insulin aspart  0-20 Units Subcutaneous Q4H  . insulin detemir  22 Units Subcutaneous BID  . levothyroxine  88 mcg Oral QAC breakfast  . mouth rinse  15 mL Mouth Rinse BID  . methylPREDNISolone (SOLU-MEDROL) injection  40 mg Intravenous Daily  . mupirocin ointment  1 application Nasal BID  . phosphorus  250 mg Oral BID AC  . pramipexole  0.125 mg Oral BID  . Ensure Max Protein  11 oz Oral TID BM  . senna-docusate  1 tablet Per Tube BID   Continuous Infusions: . sodium chloride Stopped (07/11/19 1934)  . vancomycin 1,000 mg (07/13/19 0832)   PRN Meds: acetaminophen **OR** acetaminophen, ALPRAZolam, HYDROmorphone (DILAUDID) injection, LORazepam  Allergies:    Allergies  Allergen Reactions  . Cephalexin Hives  . Codeine Palpitations, Nausea Only, Nausea And Vomiting, Rash and Shortness Of Breath    "makes heart fly, she gets flushed and passes out"  . Doxycycline Rash  . Propoxyphene Rash and Shortness Of Breath    Increase heart rate  . Sulfa Antibiotics Palpitations, Nausea Only, Shortness Of Breath and Hives    "makes heart fly, she gets flushed and passes out"  . Lovenox [Enoxaparin Sodium] Hives  . Hydrocodone Nausea And Vomiting    Hear racing & breaks out into a cold sweat.  . Meropenem Rash    Erythematous, hot, pruritic rash over arms, chest, back, abdomen, and face occurred at the end of meropenem infusion on 02/22/18    Social History:   Social History   Socioeconomic History  . Marital status: Divorced    Spouse name: Not on file  . Number of children: Not on file  . Years of education: Not on file  . Highest education level: Not on file  Occupational History  . Not on file  Tobacco Use  . Smoking status: Former Smoker    Packs/day: 2.00    Years: 27.00    Pack years: 54.00    Types: Cigarettes    Quit date: 07/30/1999    Years since quitting: 19.9  . Smokeless tobacco: Never Used  Substance and Sexual Activity  . Alcohol use: No  . Drug use:  No  . Sexual activity: Not Currently  Other Topics Concern  . Not on file  Social History Narrative  . Not on file   Social Determinants of Health   Financial Resource Strain:   . Difficulty of Paying Living Expenses:   Food Insecurity:   . Worried About Charity fundraiser in the Last Year:   . Arboriculturist in the Last Year:   Transportation Needs:   . Film/video editor (Medical):   Marland Kitchen Lack of Transportation (Non-Medical):   Physical Activity:   . Days of Exercise per Week:   . Minutes of Exercise per Session:   Stress:   . Feeling of Stress :   Social Connections:   . Frequency of Communication with Friends and Family:   . Frequency of Social Gatherings with Friends and Family:   . Attends Religious Services:   . Active Member of Clubs or Organizations:   . Attends Archivist Meetings:   Marland Kitchen Marital Status:   Intimate Partner Violence:   . Fear of Current or Ex-Partner:   . Emotionally Abused:   Marland Kitchen Physically Abused:   . Sexually Abused:     Family History:    Family History  Problem Relation Age of Onset  . Heart failure Father   . Bipolar disorder Father   . Alcohol abuse Father   . Anxiety disorder Father   . Depression Father   . Heart disease Brother   . Heart attack Brother 100       MI s/p stents placed  . Anxiety disorder Sister   . Depression Sister   . Anxiety disorder Sister   . Depression Sister   . Bipolar disorder Sister   . Alcohol abuse Sister   . Drug abuse Sister   . Heart attack Brother      ROS:  Please see the history of present illness.  Review of Systems  Constitutional: Negative.        Reports having pain all over  HENT: Negative.   Respiratory: Positive for shortness of breath.   Cardiovascular: Negative.   Gastrointestinal: Negative.   Musculoskeletal: Negative.   Neurological: Negative.   Psychiatric/Behavioral: Negative.   All other systems reviewed and are negative.    Physical Exam/Data:   Vitals:    07/13/19 0829 07/13/19 0837 07/13/19 0900 07/13/19 1000  BP:    (!) 107/59  Pulse: 71  72 77  Resp: 15  14 18   Temp:  97.7 F (36.5 C)    TempSrc:  Oral    SpO2: 100%  99% 97%  Weight:      Height:        Intake/Output Summary (Last 24 hours) at 07/13/2019 1317 Last data filed at 07/13/2019 1154 Gross per 24 hour  Intake 640 ml  Output 1050 ml  Net -410 ml   Last 3 Weights 07/07/2019 06/29/2019 06/04/2019  Weight (lbs) 350 lb 305 lb 9.6 oz 310 lb 12.8 oz  Weight (kg) 158.759 kg 138.619 kg 140.978 kg  Some encounter information is confidential and restricted. Go to Review Flowsheets activity to see all data.     Body mass index is 50.22 kg/m.  General:  Well nourished, well developed, in no acute distress, obese HEENT: normal Lymph: no adenopathy Neck: no JVD Endocrine:  No thryomegaly Vascular: No carotid bruits; FA pulses 2+ bilaterally without bruits  Cardiac:  normal S1, S2; RRR; no murmur  Lungs:  clear to auscultation bilaterally, no wheezing, rhonchi or rales  Abd: soft, nontender, no hepatomegaly  Ext: no edema Musculoskeletal:  No deformities,  BUE and BLE strength normal and equal Skin: warm and dry  Neuro:  CNs 2-12 intact, no focal abnormalities noted Psych:  Normal affect   EKG:  The EKG was personally reviewed and demonstrates:   EKG shows sinus tachycardia rate 129 bpm no significant ST-T wave changes, unable to exclude old inferior MI Telemetry:  Telemetry was personally reviewed and demonstrates: Normal sinus rhythm  Relevant CV Studies:  Echo 1. Left ventricular ejection fraction, by estimation, is 60 to 65%. The  left ventricle has normal function. The left ventricle has no regional  wall motion abnormalities. Left ventricular diastolic parameters are  consistent with Grade I diastolic  dysfunction (impaired relaxation).  2. Right ventricular systolic function is normal. The right ventricular  size is normal.  3. The inferior vena cava is  normal in size with greater than 50%  respiratory variability, suggesting right atrial pressure of 3 mmHg.   Laboratory Data:  High Sensitivity Troponin:   Recent Labs  Lab 07/08/19 0702 07/09/19 0500  TROPONINIHS 26* 24*     Chemistry Recent Labs  Lab 07/11/19 0440 07/12/19 0345 07/13/19 0406  NA 141 140 141  K 3.4* 3.0* 2.9*  CL 111 109 110  CO2 21* 23 24  GLUCOSE 154* 161* 92  BUN 50* 32* 26*  CREATININE 1.18* 0.79 0.66  CALCIUM 8.3* 8.4* 8.1*  GFRNONAA 51* >60 >60  GFRAA 59* >60 >60  ANIONGAP 9 8 7     Recent Labs  Lab 07/10/19 0412 07/11/19 0440 07/12/19 0345  PROT 6.3* 6.5 6.4*  ALBUMIN 2.6* 2.5* 2.5*  AST 53* 40 29  ALT 30 42 39  ALKPHOS 62 59 62  BILITOT 0.8 0.8 0.9   Hematology Recent Labs  Lab 07/11/19 0440 07/12/19 0345 07/13/19 0406  WBC 18.3* 9.8 10.4  RBC 3.85* 3.88 3.91  HGB 9.2* 9.2* 9.3*  HCT 30.5* 29.9* 30.9*  MCV 79.2* 77.1* 79.0*  MCH 23.9* 23.7* 23.8*  MCHC 30.2 30.8 30.1  RDW 17.3* 17.2* 17.2*  PLT 194 178 209   BNP Recent Labs  Lab 07/08/19 0014  BNP 47.0    DDimer No results for input(s): DDIMER in the last 168 hours.   Radiology/Studies:  07/10/19 Abdomen Complete  Result Date: 07/10/2019 CLINICAL DATA:  Inpatient. Abdominal pain for 4 days. History of cholecystectomy. EXAM: ABDOMEN ULTRASOUND COMPLETE COMPARISON:  07/08/2019 CT chest, abdomen and pelvis. FINDINGS: Gallbladder: Surgically absent. Common bile duct: Diameter: 7 mm Liver: No focal lesion identified. Within normal limits in parenchymal echogenicity. Portal vein is patent on color Doppler imaging with normal direction of blood flow towards the liver. IVC: No abnormality visualized. Pancreas: Visualized portion unremarkable. Spleen: Mild splenomegaly. Craniocaudal splenic length 15.8 cm. Splenic volume approximately 902 cc. No splenic mass. Right Kidney: Length: 11.3 cm. Echogenic thin right renal parenchyma. No hydronephrosis. No renal mass. Left Kidney: Length: 13.5  cm. Echogenic thin left renal parenchyma. No hydronephrosis. No renal mass. Abdominal aorta: No aneurysm visualized. Other findings: None. IMPRESSION: 1. Mild splenomegaly. 2. Echogenic atrophic kidneys, compatible with nonspecific chronic or acute on chronic renal parenchymal disease. No hydronephrosis. 3. Bile ducts are within normal post cholecystectomy limits. CBD diameter 7 mm. Electronically Signed   By: 07/10/2019 M.D.   On: 07/10/2019 09:38   {   Assessment and Plan:   1. MRSA bacteremia TEE procedure discussed with her in detail Weight is over 300 pounds, she reports having sleep apnea/COPD Some confusion on discussion today, low blood pressure last night Immobility,  unable to move herself in the bed Physical therapy note indicating full 2 person assist, could not work with PT yesterday -Reports having some residual shortness of breath, cough Seems to be chronic component since Covid several months ago -Relative debility at home --Given the body habitus, thick neck, obstructive sleep apnea, will need anesthesia for the procedure.  They will need to monitor airway while procedure is being performed ----Request has been placed, orders placed to have procedure performed on Monday morning  2.  Septic shock Blood pressures stable though somewhat low last night 90 systolic Renal failure resolved Nurses report still some confusion, but increasingly alert On vancomycin, ID following    Total encounter time more than 90 minutes  Greater than 50% was spent in counseling and coordination of care with the patient   For questions or updates, please contact [facility] Please consult [email or web address] for contact info under     Signed, Julien Nordmann, MD  07/13/2019 1:17 PM

## 2019-07-13 NOTE — Progress Notes (Signed)
ANTICOAGULATION CONSULT NOTE - Initial Consult  Pharmacy Consult for Heparin Indication: splenic infarct   Allergies  Allergen Reactions  . Cephalexin Hives  . Codeine Palpitations, Nausea Only, Nausea And Vomiting, Rash and Shortness Of Breath    "makes heart fly, she gets flushed and passes out"  . Doxycycline Rash  . Propoxyphene Rash and Shortness Of Breath    Increase heart rate  . Sulfa Antibiotics Palpitations, Nausea Only, Shortness Of Breath and Hives    "makes heart fly, she gets flushed and passes out"  . Lovenox [Enoxaparin Sodium] Hives  . Hydrocodone Nausea And Vomiting    Hear racing & breaks out into a cold sweat.  . Meropenem Rash    Erythematous, hot, pruritic rash over arms, chest, back, abdomen, and face occurred at the end of meropenem infusion on 02/22/18    Patient Measurements: Height: 5\' 10"  (177.8 cm) Weight: (!) 350 lb (158.8 kg) IBW/kg (Calculated) : 68.5 HEPARIN DW (KG): 107.6  Vital Signs: Temp: 98.4 F (36.9 C) (03/26 1400) Temp Source: Oral (03/26 1400) BP: 120/77 (03/26 1400) Pulse Rate: 72 (03/26 1400)  Labs: Recent Labs    07/11/19 0440 07/11/19 0440 07/12/19 0345 07/13/19 0406  HGB 9.2*   < > 9.2* 9.3*  HCT 30.5*  --  29.9* 30.9*  PLT 194  --  178 209  CREATININE 1.18*  --  0.79 0.66   < > = values in this interval not displayed.    Estimated Creatinine Clearance: 128.1 mL/min (by C-G formula based on SCr of 0.66 mg/dL).   Medical History: Past Medical History:  Diagnosis Date  . Abdominal wall hernia 01/29/2013  . Anxiety   . Arthritis    Rheumatoid  . C. difficile colitis   . Chronic diastolic heart failure ([place])   . COVID-19 03/23/2019   Diagnosed at [facility] (send-out) on 03/23/2019  . Depression   . Diabetes mellitus    states no meds or diet restrictions  at present  . Diastolic CHF ([place])   . Esophagitis   . Fluid retention   . GERD (gastroesophageal reflux disease)   . Hiatal hernia   . Hypertension   .  Hypokalemia due to loss of potassium 10/21/2015   Overview:  Associated with 3 weeks of diarrhea  And QT prolongation.  . Hypothyroidism   . IBS (irritable bowel syndrome)   . Moderate episode of recurrent major depressive disorder ([place]) 06/03/2004  . Morbid obesity ([place])   . MRSA (methicillin resistant Staphylococcus aureus) infection 11/2017   left inner thigh abcess  . Neurogenic bladder    has pacemaker  . Neuropathy   . Obesity   . Panic attacks   . Pneumonia due to COVID-19 virus   . Rheumatoid arthritis ([place])   . Sleep apnea    STATES SEVERE, CANT TOLERATE MASK- LAST STUDY YEARS AGO    Medications:  Medications Prior to Admission  Medication Sig Dispense Refill Last Dose  . ALPRAZolam (XANAX) 0.5 MG tablet Take 1 tablet (0.5 mg total) by mouth 2 (two) times daily as needed for anxiety. 10 tablet 0 Unknown at PRN  . ARIPiprazole (ABILIFY) 5 MG tablet TAKE 1 TABLET BY MOUTH ONCE DAILY. (Patient taking differently: Take 5 mg by mouth daily. ) 28 tablet 11 24+ hours at Unknown  . atorvastatin (LIPITOR) 80 MG tablet Take 80 mg by mouth daily at 6 PM.    72+ hours at Unknown  . benzonatate (TESSALON PERLES) 100 MG capsule Take 1 capsule (  100 mg total) by mouth 3 (three) times daily as needed for cough. 30 capsule 0 Unknown at PRN  . budesonide (PULMICORT) 0.5 MG/2ML nebulizer solution Take 2 mLs (0.5 mg total) by nebulization 2 (two) times daily. 120 mL 11 72+ hours at Unknown  . busPIRone (BUSPAR) 10 MG tablet TAKE 1 TABLET BY MOUTH TWICE DAILY (Patient taking differently: Take 10 mg by mouth 2 (two) times daily. ) 56 tablet 11 72+ hours at Unknown  . Cholecalciferol (VITAMIN D3) 125 MCG (5000 UT) CAPS Take 1 capsule (5,000 Units total) by mouth daily with breakfast. Take along with calcium and magnesium. 90 capsule 3 72+ hours at Unknown  . Ensure Max Protein (ENSURE MAX PROTEIN) LIQD Take 330 mLs (11 oz total) by mouth 2 (two) times daily between meals. 10000 mL 0   . famotidine  (PEPCID) 20 MG tablet Take 1 tablet (20 mg total) by mouth daily. 30 tablet 0 72+ hours at Unknown  . FLUoxetine (PROZAC) 20 MG capsule Take 1 capsule (20 mg total) by mouth daily. TAKE (1) CAPSULE BY MOUTH ONCE DAILY. (Patient taking differently: Take 20 mg by mouth daily. ) 30 capsule 6 72+ hours at Unknown  . folic acid (FOLVITE) 1 MG tablet Take 1 mg by mouth daily.    72+ hours at Unknown  . furosemide (LASIX) 20 MG tablet Take 1 tablet (20 mg total) by mouth daily as needed for fluid or edema (weight gain >3 lbs in 1 day or >5 lbs in 2 days). 30 tablet 0 Unknown at PRN  . GNP CALCIUM CITRATE+D MAXIMUM 315-250 MG-UNIT TABS Take 1 tablet by mouth daily.   72+ hours at Unknown  . Insulin Degludec (TRESIBA FLEXTOUCH) 200 UNIT/ML SOPN Inject 65-100 Units into the skin daily.   72+ hours at Unknown  . insulin lispro (HUMALOG) 100 UNIT/ML injection Inject 20 Units into the skin 3 (three) times daily before meals.     Marland Kitchen levothyroxine (SYNTHROID, LEVOTHROID) 88 MCG tablet Take 88 mcg by mouth daily before breakfast.   72+ hours at Unknown  . linagliptin (TRADJENTA) 5 MG TABS tablet Take 1 tablet (5 mg total) by mouth daily. 30 tablet 0 72+ hours at Unknown  . methotrexate 2.5 MG tablet Take 10 mg by mouth every Wednesday.    Past Week at Unknown time  . polyethylene glycol (MIRALAX / GLYCOLAX) 17 g packet Take 17 g by mouth daily as needed for mild constipation. 14 each 0 Unknown at PRN  . pramipexole (MIRAPEX) 0.125 MG tablet Take 0.25 mg by mouth at bedtime.    72+ hours at Unknown  . pregabalin (LYRICA) 150 MG capsule Take 1 capsule (150 mg total) by mouth 3 (three) times daily. 90 capsule 5 72+ hours at Unknown  . QUEtiapine (SEROQUEL) 300 MG tablet TAKE ONE TABLET BY MOUTH AT BEDTIME. (Patient taking differently: Take 300 mg by mouth at bedtime. TAKE ONE TABLET BY MOUTH AT BEDTIME.) 28 tablet 11 72+ hours at Unknown  . VESICARE 5 MG tablet Take 5 mg by mouth daily.   72+ hours at Unknown  . zinc  sulfate 220 (50 Zn) MG capsule Take 1 capsule (220 mg total) by mouth daily. 30 capsule 0 72+ hours at Unknown  . zolpidem (AMBIEN) 5 MG tablet Take 1 tablet (5 mg total) by mouth at bedtime. TAKE 1 TABLET BY MOUTH EVERYDAY AT BEDTIME (Patient taking differently: Take 5 mg by mouth at bedtime. ) 30 tablet 5 72+ hours at Unknown  .  ascorbic acid (VITAMIN C) 500 MG tablet Take 1 tablet (500 mg total) by mouth daily. (Patient not taking: Reported on 07/11/2019) 30 tablet 0 Not Taking at Unknown time  . ciclesonide (ALVESCO) 160 MCG/ACT inhaler Inhale 4 puffs into the lungs 2 (two) times daily. (Patient not taking: Reported on 07/11/2019) 1 Inhaler 0 Not Taking at Unknown time  . dexamethasone (DECADRON) 1 MG tablet Take 2 mg daily for 3days,Take 1mg  daily for 3 days, Take 1mg  every other day for 3days,then stop (Patient not taking: Reported on 07/11/2019) 12 tablet 0 Not Taking at Unknown time  . ipratropium-albuterol (DUONEB) 0.5-2.5 (3) MG/3ML SOLN Take 3 mLs by nebulization 3 (three) times daily. (Patient not taking: Reported on 07/11/2019) 360 mL 0 Not Taking at Unknown time  . oxyCODONE (OXY IR/ROXICODONE) 5 MG immediate release tablet Take 1 tablet (5 mg total) by mouth every 6 (six) hours as needed for severe pain. Must last 30 days 120 tablet 0     Assessment: Patient is a 59 y/o F with a medical history including hypertension, type-2 diabetes, hypothyroidism, rheumatoid arthritis, diastolic heart failure, morbid obesity, hx of hospitalization in 03/2019 for ARDS secondary to COVID-19 disease who was admitted with septic shock secondary to MRSA bacteremia. Prior imaging concerning for possible splenic infarcts. Pharmacy has been consulted to initiate heparin drip for splenic infarct.  Patient was previously on heparin drip this admission due to concern for VTE and was therapeutic on 1600 units/hr.   DVT prophylaxis of SQ heparin d/c (last dose 3/26 @ 1408).   Goal of Therapy:  Heparin level 0.3-0.7  units/ml Monitor platelets by anticoagulation protocol: Yes   Plan:  -Re-start heparin drip at 1600 units/hr, no bolus -Heparin level in 6 hours -Daily CBC per protocol  04/2019 Pharmacy Resident 07/13/2019,4:02 PM

## 2019-07-13 NOTE — TOC Progression Note (Signed)
Transition of Care [facility]) - Progression Note    Patient Details  Name: KINDSEY EBLIN MRN: [id number] Date of Birth: 1961/03/31  [facility] CM/SW Contact  Liliana Cline, LCSW Phone Number: 07/13/2019, 3:31 PM  Clinical Narrative:      Per chart review, patient still disoriented x 2. PT is recommending SNF at discharge. CSW called patient's daughter, Forde Dandy. Chasity reported she thinks patient does need SNF rehab, but that it would be up to patient's mother Kathie Rhodes. She asked that CSW called Kathie Rhodes. CSW attempted to call Kathie Rhodes, left a voicemail requesting a return call. CSW will continue to follow.       Barriers to Discharge: Continued Medical Work up  Expected Discharge Plan and Services         Living arrangements for the past 2 months: Single Family Home                                       Social Determinants of Health (SDOH) Interventions    Readmission Risk Interventions Readmission Risk Prevention Plan 07/09/2019 05/03/2019  Transportation Screening Complete Complete  Medication Review [facility]) Complete Referral to Pharmacy  PCP or Specialist appointment within 3-5 days of discharge Complete Complete  HRI or Home Care Consult Complete Complete  Palliative Care Screening - Not Applicable  Skilled Nursing Facility - Not Applicable  Some recent data might be hidden

## 2019-07-13 NOTE — Progress Notes (Signed)
PHARMACY CONSULT NOTE  Pharmacy Consult for Electrolyte Monitoring and Replacement   Recent Labs: Potassium (mmol/L)  Date Value  07/13/2019 2.9 (L)  05/27/2014 3.8   Magnesium (mg/dL)  Date Value  13/24/4010 2.0  12/02/2013 1.1 (L)   Calcium (mg/dL)  Date Value  27/25/3664 8.1 (L)   Calcium, Total (mg/dL)  Date Value  40/34/7425 8.5   Albumin (g/dL)  Date Value  95/63/8756 2.5 (L)  11/16/2018 4.2  12/03/2013 2.7 (L)   Phosphorus (mg/dL)  Date Value  43/32/9518 2.6   Sodium (mmol/L)  Date Value  07/13/2019 141  11/16/2018 134  05/27/2014 136     Assessment: 59 year old female admitted with difficulty breathing. Patient recently hospitalized 04/06/19 to 05/04/19 post-COVID. Pharmacy consulted for management of electrolytes and glucose control.  Goal of Therapy:  Electrolytes WNL Glucose < 180 (ideally)  Plan:  Electrolytes:  -K 2.9, potassium 40 mEq PO x 2 -Phos 2.6, K phos 250 mg PO BID x4 -Scr improving, UOP good.  -LBM 3/26 x2 -Follow-up AM labs tomorrow   Glucose (3/21 Hgb A1C 11.6%):  -Blood glucoses 80s-low 100s -Stress dose steroids d/c 3/25. Methylprednisolone 40 mg IV daily starting today -d5w at 50 mL/hr d/c yesterday -Decrease levemir to 22 units BID per diabetes coordinator recommendations -Continue SSI -On Tresiba and linagliptin PTA   [name] [name]  Pharmacy Resident 07/13/2019 11:10 AM

## 2019-07-13 NOTE — Progress Notes (Signed)
Pharmacy Antibiotic Note  [name] [name] is a 59 y.o. female admitted on 07/07/2019 with sepsis.  Pharmacy has been consulted for Vancomycin dosing. Patient now with MRSA bacteremia. Patient has history of neurogenic bladder with implanted bladder stimulator. Urology stated the infection source is unlikely to be InterStim implant. Ciprofloxacin d/c per CCM. WBC trending down. Tmax 24 hours 98.24F. TTE without evidence of endocarditis. TEE recommended when patient is stable. Repeat Bcx ordered 3/22.   3/26 Vancomycin peak: 31 (drawn 2 hours after end of infusion) 3/26 Vancomycin trough: 27 (drawn 6 hours early)  Plan: Will switch patient to: Vancomycin 750 mg IV Q 12 hrs. Goal AUC 400-550. Expected AUC: 456.3 Cssmin: 12.5  Calculated using 2 level kinetics: Peak (03/26 @ 1147 drawn an hour late): 31 mcg/mL, trough (03/26 @ 2015 drawn 45 minutes early): 17 mcg/mL.  Patient specific: T1/2 9.8 hrs Ke 0.0710  Will recheck VP at 03/28 @ 1300 and trough at 03/28 @ 2200 and continue to monitor. Renal function improving.  Height: 5\' 10"  (177.8 cm) Weight: (!) 350 lb (158.8 kg) IBW/kg (Calculated) : 68.5  Temp (24hrs), Avg:98.2 F (36.8 C), Min:97.7 F (36.5 C), Max:98.5 F (36.9 C)  Recent Labs  Lab  0000 07/08/19 0014 07/08/19 0257 07/08/19 0702 07/08/19 2131 07/09/19 0420 07/10/19 0412 07/10/19 1033 07/10/19 2100 07/11/19 0433 07/11/19 0440 07/12/19 0345 07/12/19 1051 07/13/19 0406 07/13/19 1147 07/13/19 1524 07/13/19 2015  WBC   < > 17.7*  --   --   --  21.7* 18.6*  --   --   --  18.3* 9.8  --  10.4  --   --   --   CREATININE  --  0.90  --   --    < > 1.26* 1.15*  --   --   --  1.18* 0.79  --  0.66  --   --   --   LATICACIDVEN  --  4.3* 5.1* 4.1*  --   --   --  1.6  --   --   --   --   --   --   --   --   --   VANCOTROUGH  --   --   --   --   --   --   --   --   --    < >  --   --   --   --   --  27* 17  VANCOPEAK  --   --   --   --   --   --   --   --  44*  --   --   --    --   --  31  --   --   VANCORANDOM  --   --   --   --   --   --   --   --   --   --   --   --  17  --   --   --   --    < > = values in this interval not displayed.    Estimated Creatinine Clearance: 128.1 mL/min (by C-G formula based on SCr of 0.66 mg/dL).    Allergies  Allergen Reactions  . Cephalexin Hives  . Codeine Palpitations, Nausea Only, Nausea And Vomiting, Rash and Shortness Of Breath    "makes heart fly, she gets flushed and passes out"  . Doxycycline Rash  . Propoxyphene Rash and Shortness Of  Breath    Increase heart rate  . Sulfa Antibiotics Palpitations, Nausea Only, Shortness Of Breath and Hives    "makes heart fly, she gets flushed and passes out"  . Lovenox [Enoxaparin Sodium] Hives  . Hydrocodone Nausea And Vomiting    Hear racing & breaks out into a cold sweat.  . Meropenem Rash    Erythematous, hot, pruritic rash over arms, chest, back, abdomen, and face occurred at the end of meropenem infusion on 02/22/18    Antimicrobials this admission: Aztreonam 3/21 >> 3/21 Ciprofloxacin 3/21>> 3/22 Vancomycin 3/21>>   Dose adjustments this admission: 3/24: Vancomycin 1250 q12h >> vancomycin 1500 q24h 3/25: Vancomycin 1500 q24h >> vancomycin 1000 mg q12h  Microbiology results: 3/22 BCx: NG x 4 days 3/21 BCx: 4/4 MRSA growth 3/21 MRSA PCR: Positive  3/20 UCx: no growth    Thank you for allowing pharmacy to be a part of this patient's care.  Tobie Lords, PharmD, BCPS Clinical Pharmacist 07/13/2019 10:40 PM

## 2019-07-13 NOTE — Progress Notes (Signed)
OVERNIGHT New onset atrial fib with RVR in setting of severe abdominal pain 10/10.  Had been treated for constipation today with good effect of 3 bowel movements reported by nurse. Patient denies any associated symptoms except her abdominal pain.  BP and mental status stable. Patient denies history of irregular heart rhythms.  She remains on heparin drip due to MRSA bacteremia and noted plan for TEE on Monday Patient was ordered amio drip.  Was given one dose of metoprolol IV while awaiting for amiodarone to arrive. Repeated electrolytes  :  Result pending, will supplement as indicated to achieve goal mag level >2 and K >4 She is already on continuous IV treatment dose heparin  Converted with amio drip, then developed bradycardia. Amio was stopped. Asymptomatic.  EKG obtained. QTc below 500, T wave inversion lateral leads. Troponin ordered. Cardiology following patient already for further management.  Remains on heparin

## 2019-07-13 NOTE — Progress Notes (Signed)
[facility] INFECT[facility]OUS DISEASE PROGRESS NOTE Date of Admission:  07/07/2019     ID: [name] [name] is a 59 y.o. female with  MRSA bacteremia Principal Problem:   Septic shock (HCC) Active Problems:   COPD (chronic obstructive pulmonary disease) (HCC)   Essential (primary) hypertension   Chronic diastolic CHF (congestive heart failure) (HCC)   Rheumatoid arthritis (HCC)   Chronic pain syndrome   Hypothyroidism   Acute metabolic encephalopathy   Hyperglycemia due to type 2 diabetes mellitus (HCC)   Morbid obesity with BMI of 50.0-59.9, adult (HCC)   History of COVID-19   Severe sepsis with septic shock (HCC)   Subjective: No fevers, More awake today. Now co bil shoulder pain L > R. WBC 10  ROS  Unable to obtain  Medications:  Antibiotics Given (last 72 hours)    Date/Time Action Medication Dose Rate   07/10/19 1737 New Bag/Given   vancomycin (VANCOREADY) IVPB 1250 mg/250 mL 1,250 mg 166.7 mL/hr   07/11/19 1934 New Bag/Given   vancomycin (VANCOREADY) IVPB 1500 mg/300 mL 1,500 mg 150 mL/hr   07/12/19 1308 New Bag/Given   vancomycin (VANCOCIN) IVPB 1000 mg/200 mL premix 1,000 mg 200 mL/hr   07/12/19 2118 New Bag/Given   vancomycin (VANCOCIN) IVPB 1000 mg/200 mL premix 1,000 mg 200 mL/hr     . busPIRone  10 mg Oral BID  . Chlorhexidine Gluconate Cloth  6 each Topical Q0600  . famotidine  20 mg Oral BID  . heparin injection (subcutaneous)  5,000 Units Subcutaneous Q8H  . insulin aspart  0-20 Units Subcutaneous Q4H  . insulin detemir  25 Units Subcutaneous BID  . levothyroxine  88 mcg Oral QAC breakfast  . mouth rinse  15 mL Mouth Rinse BID  . mupirocin ointment  1 application Nasal BID  . potassium chloride  40 mEq Oral Q4H  . pramipexole  0.125 mg Oral BID  . Ensure Max Protein  11 oz Oral TID BM  . senna-docusate  2 tablet Per Tube BID    Objective: Vital signs in last 24 hours: Temp:  [97.6 F (36.4 C)-98.5 F (36.9 C)] 98.5 F (36.9 C) (03/26 0200) Pulse  Rate:  [60-79] 71 (03/26 0829) Resp:  [11-21] 15 (03/26 0829) BP: (91-129)/(57-80) 129/60 (03/26 0800) SpO2:  [94 %-100 %] 100 % (03/26 0829) Constitutional:  Morbidly obese, awake. On O2 HENT: [place]/AT, PERRLA, no scleral icterus Mouth/Throat: Oropharynx is clear and moist. No oropharyngeal exudate.  Cardiovascular: Normal rate, regular rhythm and normal heart sounds.  Pulmonary/Chest: Effort normal and breath sounds normal. No respiratory distress.  has no wheezes.  Neck = supple, no nuchal rigidity Abdominal: massive pannusSoft. Bowel sounds are normal.  exhibits no distension. There is no tenderness.  Lymphadenopathy: no cervical adenopathy. No axillary adenopathy Neurological:awake interactive Skin: Skin is warm and dry. No rash noted. No erythema. 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   MSK pain with passive and active ROM of shoulders L> R, TTP over ant L shoudler Psychiatric: sedated Line L neck TLC wnl  Lab Results Recent Labs    07/12/19 0345 07/13/19 0406  WBC 9.8 10.4  HGB 9.2* 9.3*  HCT 29.9* 30.9*  NA 140 141  K 3.0* 2.9*  CL 109 110  CO2 23 24  BUN 32* 26*  CREATININE 0.79 0.66    Microbiology: Results for orders placed or performed during the hospital encounter of 07/07/19  Urine culture     Status: None   Collection Time: 07/07/19  12:14 AM   Specimen: In/Out Cath Urine  Result Value Ref Range Status   Specimen Description   Final    IN/OUT CATH URINE Performed at [facility], [address], [place], [place] [address]    Special Requests   Final    NONE Performed at [facility], [address], [place], [place] [address]    Culture   Final    NO GROWTH Performed at [facility], [address], [place], [place] [address]    Report Status 07/09/2019 FINAL  Final  Blood Culture (routine x 2)     Status: Abnormal   Collection Time: 07/08/19 12:14 AM   Specimen: BLOOD  Result Value Ref Range Status   Specimen  Description   Final    BLOOD LEFT ANTECUBITAL Performed at [facility], [address], [place], [place] [address]    Special Requests   Final    BOTTLES DRAWN AEROBIC AND ANAEROBIC Blood Culture adequate volume Performed at [facility], [address] [place], [place] [address]    Culture  Setup Time   Final    GRAM POSITIVE COCCI IN BOTH AEROBIC AND ANAEROBIC BOTTLES CRITICAL RESULT CALLED TO, READ BACK BY AND VERIFIED WITH: ABBY ELLINGTON AT 4034 ON 07/08/19 SNG Performed at [facility], [address] [place], [place] [address]    Culture METHICILLIN RESISTANT STAPHYLOCOCCUS AUREUS (A)  Final   Report Status 07/10/2019 FINAL  Final   Organism ID, Bacteria METHICILLIN RESISTANT STAPHYLOCOCCUS AUREUS  Final      Susceptibility   Methicillin resistant staphylococcus aureus - MIC*    CIPROFLOXACIN >=8 RESISTANT Resistant     ERYTHROMYCIN >=8 RESISTANT Resistant     GENTAMICIN <=0.5 SENSITIVE Sensitive     OXACILLIN >=4 RESISTANT Resistant     TETRACYCLINE <=1 SENSITIVE Sensitive     VANCOMYCIN 1 SENSITIVE Sensitive     TRIMETH/SULFA <=10 SENSITIVE Sensitive     CLINDAMYCIN >=8 RESISTANT Resistant     RIFAMPIN <=0.5 SENSITIVE Sensitive     Inducible Clindamycin NEGATIVE Sensitive     * METHICILLIN RESISTANT STAPHYLOCOCCUS AUREUS  Blood Culture (routine x 2)     Status: Abnormal   Collection Time: 07/08/19 12:14 AM   Specimen: BLOOD  Result Value Ref Range Status   Specimen Description   Final    BLOOD RIGHT ANTECUBITAL Performed at [facility], [address] [place], [place] [address]    Special Requests   Final    BOTTLES DRAWN AEROBIC AND ANAEROBIC Blood Culture results may not be optimal due to an excessive volume of blood received in culture bottles Performed at [facility], [address] [place], [place] [address]    Culture  Setup Time   Final    GRAM POSITIVE COCCI IN BOTH AEROBIC AND ANAEROBIC BOTTLES CRITICAL  VALUE NOTED.  VALUE IS CONSISTENT WITH PREVIOUSLY REPORTED AND CALLED VALUE. Performed at [facility], [address] [place], [place] [address]    Culture (A)  Final    STAPHYLOCOCCUS AUREUS SUSCEPTIBILITIES PERFORMED ON PREVIOUS CULTURE WITHIN THE LAST 5 DAYS. Performed at [facility], [address], [place], [place] [address]    Report Status 07/10/2019 FINAL  Final  Blood Culture ID Panel (Reflexed)     Status: Abnormal   Collection Time: 07/08/19 12:14 AM  Result Value Ref Range Status   Enterococcus species NOT DETECTED NOT DETECTED Final   Listeria monocytogenes NOT DETECTED  NOT DETECTED Final   Staphylococcus species DETECTED (A) NOT DETECTED Final    Comment: CRITICAL RESULT CALLED TO, READ BACK BY AND VERIFIED WITH: CHARLES SHANLEVER AT 8676 ON 07/08/19 SNG    Staphylococcus aureus (BCID) DETECTED (A) NOT DETECTED Final    Comment: Methicillin (oxacillin)-resistant Staphylococcus aureus (MRSA). MRSA is predictably resistant to beta-lactam antibiotics (except ceftaroline). Preferred therapy is vancomycin unless clinically contraindicated. Patient requires contact precautions if  hospitalized. CRITICAL RESULT CALLED TO, READ BACK BY AND VERIFIED WITH: CHARLES SHANLEVER AT 1950 ON 07/08/19 SNG    Methicillin resistance DETECTED (A) NOT DETECTED Final    Comment: CRITICAL RESULT CALLED TO, READ BACK BY AND VERIFIED WITH: CHARLES SHANLEVER AT 9326 ON 07/08/19 SNG    Streptococcus species NOT DETECTED NOT DETECTED Final   Streptococcus agalactiae NOT DETECTED NOT DETECTED Final   Streptococcus pneumoniae NOT DETECTED NOT DETECTED Final   Streptococcus pyogenes NOT DETECTED NOT DETECTED Final   Acinetobacter baumannii NOT DETECTED NOT DETECTED Final   Enterobacteriaceae species NOT DETECTED NOT DETECTED Final   Enterobacter cloacae complex NOT DETECTED NOT DETECTED Final   Escherichia coli NOT DETECTED NOT DETECTED Final   Klebsiella oxytoca NOT DETECTED NOT  DETECTED Final   Klebsiella pneumoniae NOT DETECTED NOT DETECTED Final   Proteus species NOT DETECTED NOT DETECTED Final   Serratia marcescens NOT DETECTED NOT DETECTED Final   Haemophilus influenzae NOT DETECTED NOT DETECTED Final   Neisseria meningitidis NOT DETECTED NOT DETECTED Final   Pseudomonas aeruginosa NOT DETECTED NOT DETECTED Final   Candida albicans NOT DETECTED NOT DETECTED Final   Candida glabrata NOT DETECTED NOT DETECTED Final   Candida krusei NOT DETECTED NOT DETECTED Final   Candida parapsilosis NOT DETECTED NOT DETECTED Final   Candida tropicalis NOT DETECTED NOT DETECTED Final    Comment: Performed at [facility], [address] [place], [place] [address]  Respiratory Panel by RT PCR (Flu A&B, Covid) - Nasopharyngeal Swab     Status: None   Collection Time: 07/08/19  1:18 AM   Specimen: Nasopharyngeal Swab  Result Value Ref Range Status   SARS Coronavirus 2 by RT PCR NEGATIVE NEGATIVE Final    Comment: (NOTE) SARS-CoV-2 target nucleic acids are NOT DETECTED. The SARS-CoV-2 RNA is generally detectable in upper respiratoy specimens during the acute phase of infection. The lowest concentration of SARS-CoV-2 viral copies this assay can detect is 131 copies/mL. A negative result does not preclude SARS-Cov-2 infection and should not be used as the sole basis for treatment or other patient management decisions. A negative result may occur with  improper specimen collection/handling, submission of specimen other than nasopharyngeal swab, presence of viral mutation(s) within the areas targeted by this assay, and inadequate number of viral copies (<131 copies/mL). A negative result must be combined with clinical observations, patient history, and epidemiological information. The expected result is Negative. Fact Sheet for Patients:  PinkCheek.be Fact Sheet for Healthcare Providers:  GravelBags.it This  test is not yet ap proved or cleared by the [place] FDA and  has been authorized for detection and/or diagnosis of SARS-CoV-2 by FDA under an Emergency Use Authorization (EUA). This EUA will remain  in effect (meaning this test can be used) for the duration of the COVID-19 declaration under Section 564(b)(1) of the Act, 21 U.S.C. section 360bbb-3(b)(1), unless the authorization is terminated or revoked sooner.    Influenza A by PCR NEGATIVE NEGATIVE Final   Influenza B by PCR NEGATIVE NEGATIVE Final    Comment: (  NOTE) The Xpert Xpress SARS-CoV-2/FLU/RSV assay is intended as an aid in  the diagnosis of influenza from Nasopharyngeal swab specimens and  should not be used as a sole basis for treatment. Nasal washings and  aspirates are unacceptable for Xpert Xpress SARS-CoV-2/FLU/RSV  testing. Fact Sheet for Patients: https://www.moore.com/ Fact Sheet for Healthcare Providers: https://www.young.biz/ This test is not yet approved or cleared by the [place] FDA and  has been authorized for detection and/or diagnosis of SARS-CoV-2 by  FDA under an Emergency Use Authorization (EUA). This EUA will remain  in effect (meaning this test can be used) for the duration of the  Covid-19 declaration under Section 564(b)(1) of the Act, 21  U.S.C. section 360bbb-3(b)(1), unless the authorization is  terminated or revoked. Performed at [facility], [address] [place], [place] [address]   MRSA PCR Screening     Status: Abnormal   Collection Time: 07/08/19  7:51 AM   Specimen: Nasal Mucosa; Nasopharyngeal  Result Value Ref Range Status   MRSA by PCR POSITIVE (A) NEGATIVE Final    Comment:        The GeneXpert MRSA Assay (FDA approved for NASAL specimens only), is one component of a comprehensive MRSA colonization surveillance program. It is not intended to diagnose MRSA infection nor to guide or monitor treatment for MRSA  infections. RESULT CALLED TO, READ BACK BY AND VERIFIED WITH: VICK GREGORY AT 1305 07/08/19.PMF Performed at [facility], [address] [place], [place] [address]   Culture, blood (Routine X 2) w Reflex to ID Panel     Status: None (Preliminary result)   Collection Time: 07/09/19  2:02 PM   Specimen: BLOOD  Result Value Ref Range Status   Specimen Description BLOOD BLOOD RIGHT HAND  Final   Special Requests   Final    BOTTLES DRAWN AEROBIC AND ANAEROBIC Blood Culture adequate volume   Culture   Final    NO GROWTH 4 DAYS Performed at [facility], [address] [place], [place] [address]    Report Status PENDING  Incomplete  Culture, blood (Routine X 2) w Reflex to ID Panel     Status: None (Preliminary result)   Collection Time: 07/09/19  3:57 PM   Specimen: BLOOD  Result Value Ref Range Status   Specimen Description BLOOD BLOOD RIGHT ARM  Final   Special Requests   Final    BOTTLES DRAWN AEROBIC ONLY Blood Culture adequate volume   Culture   Final    NO GROWTH 4 DAYS Performed at [facility], [address] [place], [place] [address]    Report Status PENDING  Incomplete    Studies/Results: No results found.  Assessment/Plan: [name] [name] is a 59 y.o. female with poorly controlled DM, RA, massive obesity, recent COVID now with MRSA bacteremia of unknown source. No obvious skin and soft tissue infection but does have some abrasions so likely skin source,. 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  Has splenic lesions noted on CT - possible infarcts.  3.23 - no fevers, a little  More interactive today. WBC elevated but on steroids. 3/26 stabilizing. Now with shoulder pain  Recommendations Consult ortho for shoulder pain L> R. Need to eval for septic arthritis (? Aspiration or Imaging needed) Repeat bcx NGTD TEE planned from monday Cont  vanco - monitor levels with worsening renal fxn.  Will need to monitor for metastatic sites of infection. Thank you very much for the consult. Will follow with you.  Mick Sell   07/13/2019, 8:31 AM

## 2019-07-13 NOTE — Consult Note (Signed)
ORTHOPAEDIC CONSULTATION  REQUESTING PHYSICIAN: Alford Highland, MD  Chief Complaint:   Bilateral shoulder pain, rule out septic arthritis  History of Present Illness: [name] [name] is a 59 y.o. female with a history of rheumatoid arthritis affecting bilateral shoulders with new onset shoulder pain over the past few days.  She has been admitted to the ICU on 3/21 with septic shock with MRSA bacteremia with unknown source.  Of note, she did have Covid in December 2020.  She does not have a fever currently or any chills.  She states that she has had rheumatoid arthritis flares in her shoulders for the past 15 years and this feels like a repeat flare.  She is usually treated with steroids, and this improves her symptoms.  Pain feels like a dull ache deep within the joint.  It is worsened with overhead motion and improved with rest.  She rates it a 7/10 currently.   Past Medical History:  Diagnosis Date  . Abdominal wall hernia 01/29/2013  . Anxiety   . Arthritis    Rheumatoid  . C. difficile colitis   . Chronic diastolic heart failure (HCC)   . COVID-19 03/23/2019   Diagnosed at [facility] (send-out) on 03/23/2019  . Depression   . Diabetes mellitus    states no meds or diet restrictions  at present  . Diastolic CHF (HCC)   . Esophagitis   . Fluid retention   . GERD (gastroesophageal reflux disease)   . Hiatal hernia   . Hypertension   . Hypokalemia due to loss of potassium 10/21/2015   Overview:  Associated with 3 weeks of diarrhea  And QT prolongation.  . Hypothyroidism   . IBS (irritable bowel syndrome)   . Moderate episode of recurrent major depressive disorder (HCC) 06/03/2004  . Morbid obesity (HCC)   . MRSA (methicillin resistant Staphylococcus aureus) infection 11/2017   left inner thigh abcess  . Neurogenic bladder    has pacemaker  . Neuropathy   . Obesity   . Panic attacks   . Pneumonia due to COVID-19 virus    . Rheumatoid arthritis (HCC)   . Sleep apnea    STATES SEVERE, CANT TOLERATE MASK- LAST STUDY YEARS AGO   Past Surgical History:  Procedure Laterality Date  . ABDOMINAL HYSTERECTOMY    . CHOLECYSTECTOMY    . DG GREAT TOE RIGHT FOOT  02/23/2018  . EYE SURGERY     bilateral cataract extraction with IOL  . HERNIA REPAIR     ventral hernia with strangulation  . LAPAROSCOPIC GASTRIC BANDING  03/20/07  . TONSILLECTOMY    . TUBAL LIGATION     Social History   Socioeconomic History  . Marital status: Divorced    Spouse name: Not on file  . Number of children: Not on file  . Years of education: Not on file  . Highest education level: Not on file  Occupational History  . Not on file  Tobacco Use  . Smoking status: Former Smoker    Packs/day: 2.00    Years: 27.00    Pack years: 54.00    Types: Cigarettes    Quit date: 07/30/1999    Years since quitting: 19.9  . Smokeless tobacco: Never Used  Substance and Sexual Activity  . Alcohol use: No  . Drug use: No  . Sexual activity: Not Currently  Other Topics Concern  . Not on file  Social History Narrative  . Not on file   Social Determinants of Health  Financial Resource Strain:   . Difficulty of Paying Living Expenses:   Food Insecurity:   . Worried About Charity fundraiser in the Last Year:   . Arboriculturist in the Last Year:   Transportation Needs:   . Film/video editor (Medical):   Marland Kitchen Lack of Transportation (Non-Medical):   Physical Activity:   . Days of Exercise per Week:   . Minutes of Exercise per Session:   Stress:   . Feeling of Stress :   Social Connections:   . Frequency of Communication with Friends and Family:   . Frequency of Social Gatherings with Friends and Family:   . Attends Religious Services:   . Active Member of Clubs or Organizations:   . Attends Archivist Meetings:   Marland Kitchen Marital Status:    Family History  Problem Relation Age of Onset  . Heart failure Father   . Bipolar  disorder Father   . Alcohol abuse Father   . Anxiety disorder Father   . Depression Father   . Heart disease Brother   . Heart attack Brother 83       MI s/p stents placed  . Anxiety disorder Sister   . Depression Sister   . Anxiety disorder Sister   . Depression Sister   . Bipolar disorder Sister   . Alcohol abuse Sister   . Drug abuse Sister   . Heart attack Brother    Allergies  Allergen Reactions  . Cephalexin Hives  . Codeine Palpitations, Nausea Only, Nausea And Vomiting, Rash and Shortness Of Breath    "makes heart fly, she gets flushed and passes out"  . Doxycycline Rash  . Propoxyphene Rash and Shortness Of Breath    Increase heart rate  . Sulfa Antibiotics Palpitations, Nausea Only, Shortness Of Breath and Hives    "makes heart fly, she gets flushed and passes out"  . Lovenox [Enoxaparin Sodium] Hives  . Hydrocodone Nausea And Vomiting    Hear racing & breaks out into a cold sweat.  . Meropenem Rash    Erythematous, hot, pruritic rash over arms, chest, back, abdomen, and face occurred at the end of meropenem infusion on 02/22/18   Prior to Admission medications   Medication Sig Start Date End Date Taking? Authorizing Provider  ALPRAZolam Duanne Moron) 0.5 MG tablet Take 1 tablet (0.5 mg total) by mouth 2 (two) times daily as needed for anxiety. 05/04/19  Yes Lavina Hamman, MD  ARIPiprazole (ABILIFY) 5 MG tablet TAKE 1 TABLET BY MOUTH ONCE DAILY. Patient taking differently: Take 5 mg by mouth daily.  02/22/19  Yes Clapacs, Madie Reno, MD  atorvastatin (LIPITOR) 80 MG tablet Take 80 mg by mouth daily at 6 PM.  02/09/13  Yes [provider]  benzonatate (TESSALON PERLES) 100 MG capsule Take 1 capsule (100 mg total) by mouth 3 (three) times daily as needed for cough. 05/23/19 05/22/20 Yes Merlyn Lot, MD  budesonide (PULMICORT) 0.5 MG/2ML nebulizer solution Take 2 mLs (0.5 mg total) by nebulization 2 (two) times daily. 06/04/19 06/03/20 Yes Tyler Pita, MD   busPIRone (BUSPAR) 10 MG tablet TAKE 1 TABLET BY MOUTH TWICE DAILY Patient taking differently: Take 10 mg by mouth 2 (two) times daily.  02/05/19  Yes Clapacs, Madie Reno, MD  Cholecalciferol (VITAMIN D3) 125 MCG (5000 UT) CAPS Take 1 capsule (5,000 Units total) by mouth daily with breakfast. Take along with calcium and magnesium. 02/14/19 02/14/20 Yes Milinda Pointer, MD  Ensure Max  Protein (ENSURE MAX PROTEIN) LIQD Take 330 mLs (11 oz total) by mouth 2 (two) times daily between meals. 05/04/19  Yes Rolly Salter, MD  famotidine (PEPCID) 20 MG tablet Take 1 tablet (20 mg total) by mouth daily. 05/05/19  Yes Rolly Salter, MD  FLUoxetine (PROZAC) 20 MG capsule Take 1 capsule (20 mg total) by mouth daily. TAKE (1) CAPSULE BY MOUTH ONCE DAILY. Patient taking differently: Take 20 mg by mouth daily.  05/24/19  Yes Clapacs, Jackquline Denmark, MD  folic acid (FOLVITE) 1 MG tablet Take 1 mg by mouth daily.    Yes [provider]  furosemide (LASIX) 20 MG tablet Take 1 tablet (20 mg total) by mouth daily as needed for fluid or edema (weight gain >3 lbs in 1 day or >5 lbs in 2 days). 05/04/19 05/03/20 Yes Rolly Salter, MD  [facility] CALCIUM CITRATE+D MAXIMUM 315-250 MG-UNIT TABS Take 1 tablet by mouth daily. 06/15/19  Yes [provider]  Insulin Degludec (TRESIBA FLEXTOUCH) 200 UNIT/ML SOPN Inject 65-100 Units into the skin daily.   Yes [provider]  insulin lispro (HUMALOG) 100 UNIT/ML injection Inject 20 Units into the skin 3 (three) times daily before meals.   Yes [provider]  levothyroxine (SYNTHROID, LEVOTHROID) 88 MCG tablet Take 88 mcg by mouth daily before breakfast.   Yes [provider]  linagliptin (TRADJENTA) 5 MG TABS tablet Take 1 tablet (5 mg total) by mouth daily. 05/05/19  Yes Rolly Salter, MD  methotrexate 2.5 MG tablet Take 10 mg by mouth every Wednesday.  06/15/19  Yes [provider]  polyethylene glycol (MIRALAX / GLYCOLAX) 17 g packet Take  17 g by mouth daily as needed for mild constipation. 05/04/19  Yes Rolly Salter, MD  pramipexole (MIRAPEX) 0.125 MG tablet Take 0.25 mg by mouth at bedtime.    Yes [provider]  pregabalin (LYRICA) 150 MG capsule Take 1 capsule (150 mg total) by mouth 3 (three) times daily. 02/14/19 08/13/19 Yes Delano Metz, MD  QUEtiapine (SEROQUEL) 300 MG tablet TAKE ONE TABLET BY MOUTH AT BEDTIME. Patient taking differently: Take 300 mg by mouth at bedtime. TAKE ONE TABLET BY MOUTH AT BEDTIME. 02/05/19  Yes Clapacs, Jackquline Denmark, MD  VESICARE 5 MG tablet Take 5 mg by mouth daily. 03/16/19  Yes [provider]  zinc sulfate 220 (50 Zn) MG capsule Take 1 capsule (220 mg total) by mouth daily. 05/05/19  Yes Rolly Salter, MD  zolpidem (AMBIEN) 5 MG tablet Take 1 tablet (5 mg total) by mouth at bedtime. TAKE 1 TABLET BY MOUTH EVERYDAY AT BEDTIME Patient taking differently: Take 5 mg by mouth at bedtime.  05/24/19  Yes Clapacs, Jackquline Denmark, MD  ascorbic acid (VITAMIN C) 500 MG tablet Take 1 tablet (500 mg total) by mouth daily. Patient not taking: Reported on 07/11/2019 05/05/19   Rolly Salter, MD  ciclesonide (ALVESCO) 160 MCG/ACT inhaler Inhale 4 puffs into the lungs 2 (two) times daily. Patient not taking: Reported on 07/11/2019 05/04/19   Rolly Salter, MD  dexamethasone (DECADRON) 1 MG tablet Take 2 mg daily for 3days,Take 1mg  daily for 3 days, Take 1mg  every other day for 3days,then stop Patient not taking: Reported on 07/11/2019 05/04/19   07/13/2019, MD  ipratropium-albuterol (DUONEB) 0.5-2.5 (3) MG/3ML SOLN Take 3 mLs by nebulization 3 (three) times daily. Patient not taking: Reported on 07/11/2019 05/04/19   07/13/2019, MD  oxyCODONE (OXY IR/ROXICODONE) 5  MG immediate release tablet Take 1 tablet (5 mg total) by mouth every 6 (six) hours as needed for severe pain. Must last 30 days 04/15/19 05/15/19  Delano Metz, MD   Recent Labs    07/11/19 [phone] 07/11/19 0440  07/12/19 0345 07/13/19 0406  WBC 18.3*  --  9.8 10.4  HGB 9.2*  --  9.2* 9.3*  HCT 30.5*  --  29.9* 30.9*  PLT 194  --  178 209  K 3.4*   < > 3.0* 2.9*  CL 111   < > 109 110  CO2 21*   < > 23 24  BUN 50*   < > 32* 26*  CREATININE 1.18*   < > 0.79 0.66  GLUCOSE 154*   < > 161* 92  CALCIUM 8.3*   < > 8.4* 8.1*   < > = values in this interval not displayed.   No results found.   Positive ROS: All other systems have been reviewed and were otherwise negative with the exception of those mentioned in the HPI and as above.  Physical Exam: BP (!) 107/59   Pulse 77   Temp 97.7 F (36.5 C) (Oral)   Resp 18   Ht 5\' 10"  (1.778 m)   Wt (!) 158.8 kg   LMP 04/20/2001   SpO2 97%   BMI 50.22 kg/m  General:  Alert, no acute distress Psychiatric:  Patient is competent for consent with normal mood and affect   Cardiovascular:  No pedal edema, regular rate and rhythm Respiratory:  No wheezing, non-labored breathing GI:  Abdomen is soft and non-tender Skin:  No lesions in the area of chief complaint, no erythema Neurologic:  Sensation intact distally, CN grossly intact Lymphatic:  No axillary or cervical lymphadenopathy  Orthopedic Exam:  RUE: +ain/pin/u motor SILT r/u/m/ax +rad pulse RoM shoulder: 150 forward flexion, 45 external rotation with arm by the side, with the arm in 90 degrees abduction: ER of 90 degrees, IR of 45 degrees 5/5 supraspinatus and ER strength No focal fluid collection or gross erythema noticeable  LUE: +ain/pin/u motor SILT r/u/m/ax +rad pulse RoM shoulder: 150 forward flexion, 45 external rotation with arm by the side, with the arm in 90 degrees abduction: ER of 90 degrees, IR of 45 degrees 5/5 supraspinatus and ER strength No focal fluid collection or gross erythema noticeable  X-rays:  No dedicated shoulder x-rays were obtained. On previous chest x-rays, there does not appear to be significant degenerative change or acute pathology of the shoulder,  although views are limited.  Assessment/Plan: 59 year old female with MRSA bacteremia of unknown etiology with increased shoulder pain.  Her shoulder exam is not consistent with septic arthritis given that she has essentially full and symmetric active and passive range of motion.  The patient states that her symptoms feel like a rheumatoid arthritis flare like she has had multiple times in the past as well. 1.  Clinical suspicion for septic shoulder is very low given her physical exam of well-preserved active and passive range of motion.  I do not recommend any further imaging or aspiration for her shoulder.  2.  Given the patient's history, I would treat her for rheumatoid arthritis as able given her current clinical picture.  3.  Please page with any questions.  I will follow peripherally as needed.   59   07/13/2019 12:49 PM

## 2019-07-13 NOTE — Progress Notes (Signed)
BP 111/61   Pulse 79   Temp 98.5 F (36.9 C) (Oral)   Resp 19   Ht 5\' 10"  (1.778 m)   Wt (!) 158.8 kg   LMP 04/20/2001   SpO2 99%   BMI 50.22 kg/m    Vital signs reviewed, ICU needs resolved  Will sign off at this time. No further recommendations at this time.  Please call [phone] for further questions. Thank you.    [phone], M.D.  [facility]  Medical Director [facility] Medical Director [facility] Cardio-Pulmonary Department

## 2019-07-13 NOTE — Progress Notes (Signed)
Patient ID: OTHELLA SLAPPEY, female   DOB: 07/20/1960, 59 y.o.   MRN: [id number] [facility] PROGRESS NOTE  MAILEN NEWBORN UKG:[id number] DOB: 1960/04/27 DOA: 07/07/2019 PCP: [facility]  HPI/Subjective: Patient was not feeling well.  Complains of shoulder pain bilaterally.  She has a history of rheumatoid arthritis and states that is acting up.  Positive nausea.  Some abdominal pain.  Some diarrhea.  Objective: Vitals:   07/13/19 1300 07/13/19 1400  BP:  120/77  Pulse:  72  Resp: 20 15  Temp:  98.4 F (36.9 C)  SpO2:  96%    Intake/Output Summary (Last 24 hours) at 07/13/2019 1543 Last data filed at 07/13/2019 1300 Gross per 24 hour  Intake 440 ml  Output 1050 ml  Net -610 ml   Filed Weights   07/07/19 2310  Weight: (!) 158.8 kg    ROS: Review of Systems  Constitutional: Negative for chills and fever.  Eyes: Negative for blurred vision.  Respiratory: Positive for shortness of breath. Negative for cough.   Cardiovascular: Negative for chest pain.  Gastrointestinal: Positive for abdominal pain, diarrhea and nausea. Negative for constipation and vomiting.  Genitourinary: Negative for dysuria.  Musculoskeletal: Negative for joint pain.  Neurological: Negative for dizziness and headaches.   Exam: Physical Exam  Constitutional: She is oriented to person, place, and time.  HENT:  Nose: No mucosal edema.  Mouth/Throat: No oropharyngeal exudate or posterior oropharyngeal edema.  Eyes: Pupils are equal, round, and reactive to light. Conjunctivae, EOM and lids are normal.  Cardiovascular: S1 normal and S2 normal. Exam reveals no gallop.  No murmur heard. Respiratory: No respiratory distress. She has decreased breath sounds in the right lower field and the left lower field. She has no wheezes. She has no rhonchi. She has no rales.  GI: Soft. Bowel sounds are normal. There is no abdominal tenderness.  Musculoskeletal:     Right ankle: No swelling.     Left ankle:  No swelling.  Lymphadenopathy:    She has no cervical adenopathy.  Neurological: She is alert and oriented to person, place, and time. No cranial nerve deficit.  Skin: Skin is warm. No rash noted. Nails show no clubbing.  Psychiatric: She has a normal mood and affect.      Data Reviewed: Basic Metabolic Panel: Recent Labs  Lab 07/09/19 0420 07/10/19 0412 07/11/19 0440 07/12/19 0345 07/13/19 0406  NA 135 138 141 140 141  K 3.7 3.8 3.4* 3.0* 2.9*  CL 104 107 111 109 110  CO2 23 23 21* 23 24  GLUCOSE 348* 262* 154* 161* 92  BUN 35* 47* 50* 32* 26*  CREATININE 1.26* 1.15* 1.18* 0.79 0.66  CALCIUM 7.8* 8.0* 8.3* 8.4* 8.1*  MG 1.4* 2.6* 2.7* 2.2 2.0  PHOS 5.6* 4.0 4.7* 3.3 2.6   Liver Function Tests: Recent Labs  Lab 07/08/19 0014 07/09/19 0420 07/10/19 0412 07/11/19 0440 07/12/19 0345  AST 17 16 53* 40 29  ALT 12 12 30  42 39  ALKPHOS 87 67 62 59 62  BILITOT 0.9 0.9 0.8 0.8 0.9  PROT 7.7 6.2* 6.3* 6.5 6.4*  ALBUMIN 3.4* 2.5* 2.6* 2.5* 2.5*   Recent Labs  Lab 07/08/19 0014  LIPASE 36   CBC: Recent Labs  Lab 07/09/19 0420 07/10/19 0412 07/11/19 0440 07/12/19 0345 07/13/19 0406  WBC 21.7* 18.6* 18.3* 9.8 10.4  NEUTROABS 19.6* 15.8* 15.7* 8.2* 8.2*  HGB 9.7* 9.4* 9.2* 9.2* 9.3*  HCT 31.5* 31.3* 30.5* 29.9* 30.9*  MCV 78.8* 79.0* 79.2* 77.1* 79.0*  PLT 212 191 194 178 209   BNP (last 3 results) Recent Labs    04/29/19 0559 05/23/19 1929 07/08/19 0014  BNP 39.0 19.0 47.0    CBG: Recent Labs  Lab 07/12/19 2019 07/12/19 2315 07/13/19 0348 07/13/19 0724 07/13/19 1129  GLUCAP 108* 109* 83 81 96    Recent Results (from the past 240 hour(s))  Urine culture     Status: None   Collection Time: 07/07/19 12:14 AM   Specimen: In/Out Cath Urine  Result Value Ref Range Status   Specimen Description   Final    IN/OUT CATH URINE Performed at [facility], [address] [place], [place] [address]    Special Requests   Final     NONE Performed at [facility], [address], [place], [place] [address]    Culture   Final    NO GROWTH Performed at [facility], [address], [place], [place] [address]    Report Status 07/09/2019 FINAL  Final  Blood Culture (routine x 2)     Status: Abnormal   Collection Time: 07/08/19 12:14 AM   Specimen: BLOOD  Result Value Ref Range Status   Specimen Description   Final    BLOOD LEFT ANTECUBITAL Performed at [facility], [address], [place], [place] [address]    Special Requests   Final    BOTTLES DRAWN AEROBIC AND ANAEROBIC Blood Culture adequate volume Performed at [facility], [address] [place], [place] [address]    Culture  Setup Time   Final    GRAM POSITIVE COCCI IN BOTH AEROBIC AND ANAEROBIC BOTTLES CRITICAL RESULT CALLED TO, READ BACK BY AND VERIFIED WITH: ABBY ELLINGTON AT 4235 ON 07/08/19 SNG Performed at [facility], [address] [place], [place] [address]    Culture METHICILLIN RESISTANT STAPHYLOCOCCUS AUREUS (A)  Final   Report Status 07/10/2019 FINAL  Final   Organism ID, Bacteria METHICILLIN RESISTANT STAPHYLOCOCCUS AUREUS  Final      Susceptibility   Methicillin resistant staphylococcus aureus - MIC*    CIPROFLOXACIN >=8 RESISTANT Resistant     ERYTHROMYCIN >=8 RESISTANT Resistant     GENTAMICIN <=0.5 SENSITIVE Sensitive     OXACILLIN >=4 RESISTANT Resistant     TETRACYCLINE <=1 SENSITIVE Sensitive     VANCOMYCIN 1 SENSITIVE Sensitive     TRIMETH/SULFA <=10 SENSITIVE Sensitive     CLINDAMYCIN >=8 RESISTANT Resistant     RIFAMPIN <=0.5 SENSITIVE Sensitive     Inducible Clindamycin NEGATIVE Sensitive     * METHICILLIN RESISTANT STAPHYLOCOCCUS AUREUS  Blood Culture (routine x 2)     Status: Abnormal   Collection Time: 07/08/19 12:14 AM   Specimen: BLOOD  Result Value Ref Range Status   Specimen Description   Final    BLOOD RIGHT ANTECUBITAL Performed at [facility], [address] [place], [place] [address]    Special Requests   Final    BOTTLES DRAWN AEROBIC AND ANAEROBIC Blood Culture results may not be optimal due to an excessive volume of blood received in culture bottles Performed at [facility], [address] [place], [place] [address]    Culture  Setup Time   Final    GRAM POSITIVE COCCI IN BOTH AEROBIC AND ANAEROBIC BOTTLES CRITICAL VALUE NOTED.  VALUE IS CONSISTENT WITH PREVIOUSLY REPORTED AND CALLED VALUE. Performed at [facility], [address] [place], [place] [address]  Culture (A)  Final    STAPHYLOCOCCUS AUREUS SUSCEPTIBILITIES PERFORMED ON PREVIOUS CULTURE WITHIN THE LAST 5 DAYS. Performed at [facility], [address] [place], [place] [address]    Report Status 07/10/2019 FINAL  Final  Blood Culture ID Panel (Reflexed)     Status: Abnormal   Collection Time: 07/08/19 12:14 AM  Result Value Ref Range Status   Enterococcus species NOT DETECTED NOT DETECTED Final   Listeria monocytogenes NOT DETECTED NOT DETECTED Final   Staphylococcus species DETECTED (A) NOT DETECTED Final    Comment: CRITICAL RESULT CALLED TO, READ BACK BY AND VERIFIED WITH: CHARLES SHANLEVER AT 1136 ON 07/08/19 SNG    Staphylococcus aureus (BCID) DETECTED (A) NOT DETECTED Final    Comment: Methicillin (oxacillin)-resistant Staphylococcus aureus (MRSA). MRSA is predictably resistant to beta-lactam antibiotics (except ceftaroline). Preferred therapy is vancomycin unless clinically contraindicated. Patient requires contact precautions if  hospitalized. CRITICAL RESULT CALLED TO, READ BACK BY AND VERIFIED WITH: CHARLES SHANLEVER AT 1136 ON 07/08/19 SNG    Methicillin resistance DETECTED (A) NOT DETECTED Final    Comment: CRITICAL RESULT CALLED TO, READ BACK BY AND VERIFIED WITH: CHARLES SHANLEVER AT 1136 ON 07/08/19 SNG    Streptococcus species NOT DETECTED NOT DETECTED Final   Streptococcus agalactiae NOT DETECTED NOT DETECTED Final    Streptococcus pneumoniae NOT DETECTED NOT DETECTED Final   Streptococcus pyogenes NOT DETECTED NOT DETECTED Final   Acinetobacter baumannii NOT DETECTED NOT DETECTED Final   Enterobacteriaceae species NOT DETECTED NOT DETECTED Final   Enterobacter cloacae complex NOT DETECTED NOT DETECTED Final   Escherichia coli NOT DETECTED NOT DETECTED Final   Klebsiella oxytoca NOT DETECTED NOT DETECTED Final   Klebsiella pneumoniae NOT DETECTED NOT DETECTED Final   Proteus species NOT DETECTED NOT DETECTED Final   Serratia marcescens NOT DETECTED NOT DETECTED Final   Haemophilus influenzae NOT DETECTED NOT DETECTED Final   Neisseria meningitidis NOT DETECTED NOT DETECTED Final   Pseudomonas aeruginosa NOT DETECTED NOT DETECTED Final   Candida albicans NOT DETECTED NOT DETECTED Final   Candida glabrata NOT DETECTED NOT DETECTED Final   Candida krusei NOT DETECTED NOT DETECTED Final   Candida parapsilosis NOT DETECTED NOT DETECTED Final   Candida tropicalis NOT DETECTED NOT DETECTED Final    Comment: Performed at [facility], [address] [place], [place] [address]  Respiratory Panel by RT PCR (Flu A&B, Covid) - Nasopharyngeal Swab     Status: None   Collection Time: 07/08/19  1:18 AM   Specimen: Nasopharyngeal Swab  Result Value Ref Range Status   SARS Coronavirus 2 by RT PCR NEGATIVE NEGATIVE Final    Comment: (NOTE) SARS-CoV-2 target nucleic acids are NOT DETECTED. The SARS-CoV-2 RNA is generally detectable in upper respiratoy specimens during the acute phase of infection. The lowest concentration of SARS-CoV-2 viral copies this assay can detect is 131 copies/mL. A negative result does not preclude SARS-Cov-2 infection and should not be used as the sole basis for treatment or other patient management decisions. A negative result may occur with  improper specimen collection/handling, submission of specimen other than nasopharyngeal swab, presence of viral mutation(s) within  the areas targeted by this assay, and inadequate number of viral copies (<131 copies/mL). A negative result must be combined with clinical observations, patient history, and epidemiological information. The expected result is Negative. Fact Sheet for Patients:  https://www.moore.com/ Fact Sheet for Healthcare Providers:  https://www.young.biz/ This test is not yet ap proved or cleared by the [place]  FDA and  has been authorized for detection and/or diagnosis of SARS-CoV-2 by FDA under an Emergency Use Authorization (EUA). This EUA will remain  in effect (meaning this test can be used) for the duration of the COVID-19 declaration under Section 564(b)(1) of the Act, 21 U.S.C. section 360bbb-3(b)(1), unless the authorization is terminated or revoked sooner.    Influenza A by PCR NEGATIVE NEGATIVE Final   Influenza B by PCR NEGATIVE NEGATIVE Final    Comment: (NOTE) The Xpert Xpress SARS-CoV-2/FLU/RSV assay is intended as an aid in  the diagnosis of influenza from Nasopharyngeal swab specimens and  should not be used as a sole basis for treatment. Nasal washings and  aspirates are unacceptable for Xpert Xpress SARS-CoV-2/FLU/RSV  testing. Fact Sheet for Patients: https://www.moore.com/ Fact Sheet for Healthcare Providers: https://www.young.biz/ This test is not yet approved or cleared by the [place] FDA and  has been authorized for detection and/or diagnosis of SARS-CoV-2 by  FDA under an Emergency Use Authorization (EUA). This EUA will remain  in effect (meaning this test can be used) for the duration of the  Covid-19 declaration under Section 564(b)(1) of the Act, 21  U.S.C. section 360bbb-3(b)(1), unless the authorization is  terminated or revoked. Performed at [facility], [address] [place], [place] [address]   MRSA PCR Screening     Status: Abnormal   Collection Time:  07/08/19  7:51 AM   Specimen: Nasal Mucosa; Nasopharyngeal  Result Value Ref Range Status   MRSA by PCR POSITIVE (A) NEGATIVE Final    Comment:        The GeneXpert MRSA Assay (FDA approved for NASAL specimens only), is one component of a comprehensive MRSA colonization surveillance program. It is not intended to diagnose MRSA infection nor to guide or monitor treatment for MRSA infections. RESULT CALLED TO, READ BACK BY AND VERIFIED WITH: VICK GREGORY AT 1305 07/08/19.PMF Performed at [facility], [address] [place], [place] [address]   Culture, blood (Routine X 2) w Reflex to ID Panel     Status: None (Preliminary result)   Collection Time: 07/09/19  2:02 PM   Specimen: BLOOD  Result Value Ref Range Status   Specimen Description BLOOD BLOOD RIGHT HAND  Final   Special Requests   Final    BOTTLES DRAWN AEROBIC AND ANAEROBIC Blood Culture adequate volume   Culture   Final    NO GROWTH 4 DAYS Performed at [facility], [address], [place], [place] [address]    Report Status PENDING  Incomplete  Culture, blood (Routine X 2) w Reflex to ID Panel     Status: None (Preliminary result)   Collection Time: 07/09/19  3:57 PM   Specimen: BLOOD  Result Value Ref Range Status   Specimen Description BLOOD BLOOD RIGHT ARM  Final   Special Requests   Final    BOTTLES DRAWN AEROBIC ONLY Blood Culture adequate volume   Culture   Final    NO GROWTH 4 DAYS Performed at [facility], [address] [place], [place] [address]    Report Status PENDING  Incomplete     Studies: DG Abd 2 Views  Result Date: 07/13/2019 CLINICAL DATA:  Abdominal pain EXAM: ABDOMEN - 2 VIEW COMPARISON:  02/24/2013 FINDINGS: Scattered large and small bowel gas is noted. Mild small bowel prominence is noted. InterStim device is noted in the left hemipelvis. No free air is seen. IMPRESSION: Scattered large and small bowel gas with mild small  bowel dilatation. This may represent  a small bowel ileus. No definitive obstructive changes are seen. Electronically Signed   By: Alcide Clever M.D.   On: 07/13/2019 14:47    Scheduled Meds: . busPIRone  10 mg Oral BID  . Chlorhexidine Gluconate Cloth  6 each Topical Q0600  . famotidine  20 mg Oral BID  . heparin injection (subcutaneous)  5,000 Units Subcutaneous Q8H  . insulin aspart  0-20 Units Subcutaneous Q4H  . insulin detemir  22 Units Subcutaneous BID  . levothyroxine  88 mcg Oral QAC breakfast  . mouth rinse  15 mL Mouth Rinse BID  . methylPREDNISolone (SOLU-MEDROL) injection  40 mg Intravenous Daily  . mupirocin ointment  1 application Nasal BID  . phosphorus  250 mg Oral BID AC  . pramipexole  0.125 mg Oral BID  . Ensure Max Protein  11 oz Oral TID BM   Continuous Infusions: . sodium chloride Stopped (07/11/19 1934)  . vancomycin 1,000 mg (07/13/19 4098)    Assessment/Plan:  1. MRSA sepsis present on admission.  On vancomycin.  Will need TEE to determine length of therapy 2. Nausea vomiting diarrhea and abdominal pain.  Ileus seen on x-ray.  Get rid of senna.  As needed nausea medications.  Drop to clear liquid diet.  Watch and stepdown overnight again. 3. Splenic infact- start heparin drip. 4. Rheumatoid arthritis flare with bilateral shoulder pain.  Started Solu-Medrol. 5. Morbid obesity with a BMI of 50.22 6. Hypothyroidism unspecified on levothyroxine 7. Type 2 diabetes mellitus on detemir insulin and sliding scale 8. Anxiety depression continue psychiatric medications.  Code Status:     Code Status Orders  (From admission, onward)         Start     Ordered   07/08/19 0337  Full code  Continuous     07/08/19 0340        Code Status History    Date Active Date Inactive Code Status Order ID Comments User Context   04/07/2019 0552 05/04/2019 2052 Full Code [id number]  Lorretta Harp, MD ED   03/27/2019 1419 03/31/2019 1608 Full Code [id number]  Coralie Keens, MD Inpatient   03/27/2019 0446  03/27/2019 1402 Full Code [id number]  Mansy, Vernetta Honey, MD ED   Advance Care Planning Activity     Family Communication: Spoke with the patient's daughter and she gave me the number for her mother and I called her also. Disposition Plan: Patient likely will be in the hospital through the weekend.  TEE will need to be done prior to disposition to determine length of therapy for MRSA sepsis.  Consultants:  Cardiology  Infectious disease  Antibiotics:  IV vancomycin  Time spent: 28 minutes  Maxyne Der[employer]

## 2019-07-13 NOTE — Evaluation (Signed)
Physical Therapy Evaluation Patient Details Name: [name] [name] MRN: [id number] DOB: 05-Feb-1961 Today's Date: 07/13/2019   History of Present Illness  Pt admitted for septic shock secondary to MRSA bacteremia. History includes covid 39 with ARDS in Dec 2020 and since has been on 2L of O2 at baseline. Other PMH includes RA  Clinical Impression  Pt is a pleasant 59 year old female who was admitted for septic shock. Pt performs bed mobility with max/total assist and unable to fully roll to side-lying. Further mobility deferred due to pain/safety/weakness. Pt demonstrates deficits with strength/mobility/cognition/pain. Pt reports severe pain and decreased ROM in B shoulders with over head movement. Able to perform elbow/wrist/hand motion. Pt confused thinking it is 2008 and Obama was president, poor historian. Doesn't appear to be at baseline level. Would benefit from skilled PT to address above deficits and promote optimal return to PLOF; recommend transition to STR upon discharge from acute hospitalization.     Follow Up Recommendations SNF    Equipment Recommendations  None recommended by PT    Recommendations for Other Services       Precautions / Restrictions Precautions Precautions: Fall Restrictions Weight Bearing Restrictions: No      Mobility  Bed Mobility Overal bed mobility: Needs Assistance Bed Mobility: Rolling Rolling: Max assist         General bed mobility comments: needs assist for reaching across body for railing in order to roll onto side. Limited active assist with B LE. Unable to fully turn to side without max/total assist. Not safe for continued mobility. will need +2.  Transfers                 General transfer comment: unable  Ambulation/Gait                Stairs            Wheelchair Mobility    Modified Rankin (Stroke Patients Only)       Balance Overall balance assessment: Needs assistance                                            Pertinent Vitals/Pain Pain Assessment: Faces Faces Pain Scale: Hurts little more Pain Location: B shoulders Pain Descriptors / Indicators: Aching;Discomfort;Dull Pain Intervention(s): Limited activity within patient's tolerance;Repositioned    Home Living Family/patient expects to be discharged to:: Private residence Living Arrangements: Children(daughter) Available Help at Discharge: Family;Available 24 hours/day Type of Home: House Home Access: Ramped entrance     Home Layout: One level Home Equipment: Walker - 2 wheels;Cane - single point;Shower seat - built in;Bedside commode;Wheelchair - power;Other (comment)      Prior Function Level of Independence: Independent with assistive device(s)         Comments: has recently been using B SPC since she was dx with Covid along with 2L of O2. Reports no recent falls. Previously had been completely indep     Hand Dominance        Extremity/Trunk Assessment   Upper Extremity Assessment Upper Extremity Assessment: Generalized weakness(B UE grossly 3/5 and painful with overhead motions)    Lower Extremity Assessment Lower Extremity Assessment: Generalized weakness(B LE grossly 2/5)       Communication   Communication: No difficulties  Cognition Arousal/Alertness: Awake/alert Behavior During Therapy: WFL for tasks assessed/performed Overall Cognitive Status: Within Functional Limits for tasks assessed  General Comments      Exercises Other Exercises Other Exercises: Supine ther-ex performed on B UE/LE including bicep curls, hip abd/add, SLRs, and quad sets. ALl ther-ex performed x 10 reps with mod assist. Educated on frequency and duration   Assessment/Plan    PT Assessment Patient needs continued PT services  PT Problem List Decreased strength;Decreased activity tolerance;Decreased balance;Decreased mobility;Decreased  cognition;Obesity;Pain       PT Treatment Interventions Gait training;DME instruction;Therapeutic activities;Therapeutic exercise;Balance training    PT Goals (Current goals can be found in the Care Plan section)  Acute Rehab PT Goals Patient Stated Goal: to be able to walk again PT Goal Formulation: With patient Time For Goal Achievement: 07/27/19 Potential to Achieve Goals: Good    Frequency Min 2X/week   Barriers to discharge        Co-evaluation               AM-PAC PT "6 Clicks" Mobility  Outcome Measure Help needed turning from your back to your side while in a flat bed without using bedrails?: Total Help needed moving from lying on your back to sitting on the side of a flat bed without using bedrails?: Total Help needed moving to and from a bed to a chair (including a wheelchair)?: Total Help needed standing up from a chair using your arms (e.g., wheelchair or bedside chair)?: Total Help needed to walk in hospital room?: Total Help needed climbing 3-5 steps with a railing? : Total 6 Click Score: 6    End of Session Equipment Utilized During Treatment: Oxygen Activity Tolerance: Patient limited by pain Patient left: in bed;with bed alarm set Nurse Communication: Mobility status PT Visit Diagnosis: Muscle weakness (generalized) (M62.81);Difficulty in walking, not elsewhere classified (R26.2);Pain;Unsteadiness on feet (R26.81) Pain - Right/Left: (bilat) Pain - part of body: Shoulder    Time: 9675-9163 PT Time Calculation (min) (ACUTE ONLY): 22 min   Charges:   PT Evaluation $PT Eval Moderate Complexity: 1 Mod PT Treatments $Therapeutic Exercise: 8-22 mins        Elizabeth Palau, PT, DPT [phone]   Briceida Rasberry 07/13/2019, 1:56 PM

## 2019-07-14 ENCOUNTER — Inpatient Hospital Stay: Payer: Medicaid Other

## 2019-07-14 LAB — LIPASE, BLOOD: Lipase: 176 U/L — ABNORMAL HIGH (ref 11–51)

## 2019-07-14 LAB — CBC WITH DIFFERENTIAL/PLATELET
Abs Immature Granulocytes: 0.2 10*3/uL — ABNORMAL HIGH (ref 0.00–0.07)
Basophils Absolute: 0 10*3/uL (ref 0.0–0.1)
Basophils Relative: 0 %
Eosinophils Absolute: 0 10*3/uL (ref 0.0–0.5)
Eosinophils Relative: 0 %
HCT: 32.5 % — ABNORMAL LOW (ref 36.0–46.0)
Hemoglobin: 9.4 g/dL — ABNORMAL LOW (ref 12.0–15.0)
Immature Granulocytes: 2 %
Lymphocytes Relative: 7 %
Lymphs Abs: 0.7 10*3/uL (ref 0.7–4.0)
MCH: 23.1 pg — ABNORMAL LOW (ref 26.0–34.0)
MCHC: 28.9 g/dL — ABNORMAL LOW (ref 30.0–36.0)
MCV: 79.9 fL — ABNORMAL LOW (ref 80.0–100.0)
Monocytes Absolute: 0.4 10*3/uL (ref 0.1–1.0)
Monocytes Relative: 4 %
Neutro Abs: 8.4 10*3/uL — ABNORMAL HIGH (ref 1.7–7.7)
Neutrophils Relative %: 87 %
Platelets: 233 10*3/uL (ref 150–400)
RBC: 4.07 MIL/uL (ref 3.87–5.11)
RDW: 17.2 % — ABNORMAL HIGH (ref 11.5–15.5)
WBC: 9.7 10*3/uL (ref 4.0–10.5)
nRBC: 0 % (ref 0.0–0.2)

## 2019-07-14 LAB — COMPREHENSIVE METABOLIC PANEL
ALT: 29 U/L (ref 0–44)
AST: 18 U/L (ref 15–41)
Albumin: 2.3 g/dL — ABNORMAL LOW (ref 3.5–5.0)
Alkaline Phosphatase: 70 U/L (ref 38–126)
Anion gap: 12 (ref 5–15)
BUN: 21 mg/dL — ABNORMAL HIGH (ref 6–20)
CO2: 21 mmol/L — ABNORMAL LOW (ref 22–32)
Calcium: 8.4 mg/dL — ABNORMAL LOW (ref 8.9–10.3)
Chloride: 108 mmol/L (ref 98–111)
Creatinine, Ser: 0.54 mg/dL (ref 0.44–1.00)
GFR calc Af Amer: >60 mL/min (ref >60)
GFR calc non Af Amer: >60 mL/min (ref >60)
Glucose, Bld: 209 mg/dL — ABNORMAL HIGH (ref 70–99)
Potassium: 3.9 mmol/L (ref 3.5–5.1)
Sodium: 141 mmol/L (ref 135–145)
Total Bilirubin: 0.7 mg/dL (ref 0.3–1.2)
Total Protein: 6.7 g/dL (ref 6.5–8.1)

## 2019-07-14 LAB — BASIC METABOLIC PANEL
Anion gap: 7 (ref 5–15)
BUN: 20 mg/dL (ref 6–20)
CO2: 24 mmol/L (ref 22–32)
Calcium: 8.5 mg/dL — ABNORMAL LOW (ref 8.9–10.3)
Chloride: 111 mmol/L (ref 98–111)
Creatinine, Ser: 0.6 mg/dL (ref 0.44–1.00)
GFR calc Af Amer: >60 mL/min (ref >60)
GFR calc non Af Amer: >60 mL/min (ref >60)
Glucose, Bld: 217 mg/dL — ABNORMAL HIGH (ref 70–99)
Potassium: 4.3 mmol/L (ref 3.5–5.1)
Sodium: 142 mmol/L (ref 135–145)

## 2019-07-14 LAB — TROPONIN I (HIGH SENSITIVITY): Troponin I (High Sensitivity): 17 ng/L (ref <18)

## 2019-07-14 LAB — CULTURE, BLOOD (ROUTINE X 2)
Culture: NO GROWTH
Culture: NO GROWTH
Special Requests: ADEQUATE
Special Requests: ADEQUATE

## 2019-07-14 LAB — GLUCOSE, CAPILLARY
Glucose-Capillary: 191 mg/dL — ABNORMAL HIGH (ref 70–99)
Glucose-Capillary: 195 mg/dL — ABNORMAL HIGH (ref 70–99)
Glucose-Capillary: 200 mg/dL — ABNORMAL HIGH (ref 70–99)
Glucose-Capillary: 212 mg/dL — ABNORMAL HIGH (ref 70–99)
Glucose-Capillary: 222 mg/dL — ABNORMAL HIGH (ref 70–99)
Glucose-Capillary: 253 mg/dL — ABNORMAL HIGH (ref 70–99)
Glucose-Capillary: 255 mg/dL — ABNORMAL HIGH (ref 70–99)

## 2019-07-14 LAB — HEPARIN LEVEL (UNFRACTIONATED)
Heparin Unfractionated: 0.29 IU/mL — ABNORMAL LOW (ref 0.30–0.70)
Heparin Unfractionated: 0.25 IU/mL — ABNORMAL LOW (ref 0.30–0.70)
Heparin Unfractionated: 0.33 IU/mL (ref 0.30–0.70)

## 2019-07-14 LAB — TSH: TSH: 1.748 u[IU]/mL (ref 0.350–4.500)

## 2019-07-14 LAB — MAGNESIUM: Magnesium: 2 mg/dL (ref 1.7–2.4)

## 2019-07-14 LAB — PHOSPHORUS: Phosphorus: 3.5 mg/dL (ref 2.5–4.6)

## 2019-07-14 LAB — FIBRIN DERIVATIVES D-DIMER (ARMC ONLY): Fibrin derivatives D-dimer (ARMC): 4713.07 ng/mL (FEU) — ABNORMAL HIGH (ref 0.00–499.00)

## 2019-07-14 MED ORDER — SODIUM CHLORIDE 0.9% FLUSH
10.0000 mL | Freq: Two times a day (BID) | INTRAVENOUS | Status: DC
  Administered 2019-07-14 – 2019-07-15 (×4): 10 mL

## 2019-07-14 MED ORDER — IOHEXOL 9 MG/ML PO SOLN
500.0000 mL | ORAL | Status: AC
  Administered 2019-07-14 (×2): 500 mL via ORAL

## 2019-07-14 MED ORDER — CHLORHEXIDINE GLUCONATE CLOTH 2 % EX PADS
6.0000 | MEDICATED_PAD | Freq: Every day | CUTANEOUS | Status: DC
  Administered 2019-07-14 – 2019-07-16 (×3): 6 via TOPICAL

## 2019-07-14 MED ORDER — POTASSIUM CHLORIDE 10 MEQ/100ML IV SOLN
10.0000 meq | INTRAVENOUS | Status: AC
  Administered 2019-07-14 (×2): 10 meq via INTRAVENOUS
  Filled 2019-07-14 (×2): qty 100

## 2019-07-14 MED ORDER — IOHEXOL 300 MG/ML  SOLN
125.0000 mL | Freq: Once | INTRAMUSCULAR | Status: AC | PRN
  Administered 2019-07-14: 125 mL via INTRAVENOUS

## 2019-07-14 MED ORDER — SODIUM CHLORIDE 0.9% FLUSH: 10.0000 mL | INTRAVENOUS | Status: DC | PRN

## 2019-07-14 NOTE — Progress Notes (Signed)
PHARMACY CONSULT NOTE  Pharmacy Consult for Electrolyte Monitoring and Replacement   Recent Labs: Potassium (mmol/L)  Date Value  07/14/2019 4.3  05/27/2014 3.8   Magnesium (mg/dL)  Date Value  62/94/7654 2.0  12/02/2013 1.1 (L)   Calcium (mg/dL)  Date Value  65/06/5463 8.5 (L)   Calcium, Total (mg/dL)  Date Value  68/03/7516 8.5   Albumin (g/dL)  Date Value  00/17/4944 2.3 (L)  11/16/2018 4.2  12/03/2013 2.7 (L)   Phosphorus (mg/dL)  Date Value  96/75/9163 3.5   Sodium (mmol/L)  Date Value  07/14/2019 142  11/16/2018 134  05/27/2014 136     Assessment: 59 year old female admitted with difficulty breathing. Patient recently hospitalized 04/06/19 to 05/04/19 post-COVID. Pharmacy consulted for management of electrolytes and glucose control.  Goal of Therapy:  Electrolytes WNL Glucose < 180 (ideally)  Plan:  Electrolytes:  - No replacement needed at this time.  -Follow-up AM labs tomorrow   Glucose (3/21 Hgb A1C 11.6%):  -Blood glucoses: 191-217  -Stress dose steroids d/c 3/25. Methylprednisolone 40 mg IV daily (day 2) -Decrease levemir to 22 units BID per diabetes coordinator recommendations -Continue SSI -On Tresiba and linagliptin PTA   Ronnald Ramp, PharmD, BCPS 07/14/2019 8:06 AM

## 2019-07-14 NOTE — Progress Notes (Signed)
Patient ID: [name] [name], female   DOB: 15-Jul-1960, 59 y.o.   MRN: [id number] [facility] PROGRESS NOTE  [name] [name]:[id number] DOB: [birth year] DOA: 07/07/2019 PCP: [facility]  HPI/Subjective: Patient feeling nausea but no vomiting.  Feeling abdominal bloating and tenderness all over.  Shoulder pain is much better.  Patient having back pain her usual chronic back pain.  Patient complains of some right arm weakness but her shoulder pain is better.  Worsening abdominal discomfort after drinking a juice this morning.  Objective: Vitals:   07/14/19 1100 07/14/19 1200  BP: 131/66 126/64  Pulse: 69 64  Resp: 19 18  Temp:    SpO2: 99% 99%    Intake/Output Summary (Last 24 hours) at 07/14/2019 1234 Last data filed at 07/14/2019 1124 Gross per 24 hour  Intake 899.65 ml  Output 1126 ml  Net -226.35 ml   Filed Weights   07/07/19 2310  Weight: (!) 158.8 kg    ROS: Review of Systems  Constitutional: Negative for chills and fever.  Eyes: Negative for blurred vision.  Respiratory: Positive for shortness of breath. Negative for cough.   Cardiovascular: Negative for chest pain.  Gastrointestinal: Positive for abdominal pain and nausea. Negative for constipation, diarrhea and vomiting.  Genitourinary: Negative for dysuria.  Musculoskeletal: Negative for joint pain.  Neurological: Negative for dizziness and headaches.   Exam: Physical Exam  Constitutional: She is oriented to person, place, and time.  HENT:  Nose: No mucosal edema.  Mouth/Throat: No oropharyngeal exudate or posterior oropharyngeal edema.  Eyes: Pupils are equal, round, and reactive to light. Conjunctivae, EOM and lids are normal.  Cardiovascular: S1 normal and S2 normal. Exam reveals no gallop.  No murmur heard. Respiratory: No respiratory distress. She has decreased breath sounds in the right lower field and the left lower field. She has no wheezes. She has no rhonchi. She has no rales.  GI:  Soft. She exhibits distension. There is abdominal tenderness.  Musculoskeletal:     Right ankle: No swelling.     Left ankle: No swelling.  Lymphadenopathy:    She has no cervical adenopathy.  Neurological: She is alert and oriented to person, place, and time. No cranial nerve deficit.  Skin: Skin is warm. No rash noted. Nails show no clubbing.  Psychiatric: She has a normal mood and affect.      Data Reviewed: Basic Metabolic Panel: Recent Labs  Lab 07/10/19 0412 07/10/19 0412 07/11/19 0440 07/12/19 0345 07/13/19 0406 07/13/19 2326 07/14/19 0505  NA 138   < > 141 140 141 141 142  K 3.8   < > 3.4* 3.0* 2.9* 3.9 4.3  CL 107   < > 111 109 110 108 111  CO2 23   < > 21* 23 24 21* 24  GLUCOSE 262*   < > 154* 161* 92 209* 217*  BUN 47*   < > 50* 32* 26* 21* 20  CREATININE 1.15*   < > 1.18* 0.79 0.66 0.54 0.60  CALCIUM 8.0*   < > 8.3* 8.4* 8.1* 8.4* 8.5*  MG 2.6*  --  2.7* 2.2 2.0 2.0  --   PHOS 4.0  --  4.7* 3.3 2.6  --  3.5   < > = values in this interval not displayed.   Liver Function Tests: Recent Labs  Lab 07/09/19 0420 07/10/19 0412 07/11/19 0440 07/12/19 0345 07/13/19 2326  AST 16 53* 40 29 18  ALT 12 30 42 39 29  ALKPHOS 67 62  59 62 70  BILITOT 0.9 0.8 0.8 0.9 0.7  PROT 6.2* 6.3* 6.5 6.4* 6.7  ALBUMIN 2.5* 2.6* 2.5* 2.5* 2.3*   Recent Labs  Lab 07/08/19 0014 07/13/19 2326  LIPASE 36 176*   CBC: Recent Labs  Lab 07/10/19 0412 07/11/19 0440 07/12/19 0345 07/13/19 0406 07/14/19 0505  WBC 18.6* 18.3* 9.8 10.4 9.7  NEUTROABS 15.8* 15.7* 8.2* 8.2* 8.4*  HGB 9.4* 9.2* 9.2* 9.3* 9.4*  HCT 31.3* 30.5* 29.9* 30.9* 32.5*  MCV 79.0* 79.2* 77.1* 79.0* 79.9*  PLT 191 194 178 209 233   BNP (last 3 results) Recent Labs    04/29/19 0559 05/23/19 1929 07/08/19 0014  BNP 39.0 19.0 47.0    CBG: Recent Labs  Lab 07/13/19 2008 07/13/19 2330 07/14/19 0346 07/14/19 0731 07/14/19 1149  GLUCAP 196* 203* 195* 191* 255*    Recent Results (from the  past 240 hour(s))  Urine culture     Status: None   Collection Time: 07/07/19 12:14 AM   Specimen: In/Out Cath Urine  Result Value Ref Range Status   Specimen Description   Final    IN/OUT CATH URINE Performed at [facility], [address], [place], [place] [address]    Special Requests   Final    NONE Performed at [facility], [address] [place], [place] [address]    Culture   Final    NO GROWTH Performed at [facility], [address] [place], [place] [address]    Report Status 07/09/2019 FINAL  Final  Blood Culture (routine x 2)     Status: Abnormal   Collection Time: 07/08/19 12:14 AM   Specimen: BLOOD  Result Value Ref Range Status   Specimen Description   Final    BLOOD LEFT ANTECUBITAL Performed at [facility], [address], [place], [place] [address]    Special Requests   Final    BOTTLES DRAWN AEROBIC AND ANAEROBIC Blood Culture adequate volume Performed at [facility], [address] [place], [place] [address]    Culture  Setup Time   Final    GRAM POSITIVE COCCI IN BOTH AEROBIC AND ANAEROBIC BOTTLES CRITICAL RESULT CALLED TO, READ BACK BY AND VERIFIED WITH: ABBY ELLINGTON AT 2951 ON 07/08/19 SNG Performed at [facility], [address] [place], [place] [address]    Culture METHICILLIN RESISTANT STAPHYLOCOCCUS AUREUS (A)  Final   Report Status 07/10/2019 FINAL  Final   Organism ID, Bacteria METHICILLIN RESISTANT STAPHYLOCOCCUS AUREUS  Final      Susceptibility   Methicillin resistant staphylococcus aureus - MIC*    CIPROFLOXACIN >=8 RESISTANT Resistant     ERYTHROMYCIN >=8 RESISTANT Resistant     GENTAMICIN <=0.5 SENSITIVE Sensitive     OXACILLIN >=4 RESISTANT Resistant     TETRACYCLINE <=1 SENSITIVE Sensitive     VANCOMYCIN 1 SENSITIVE Sensitive     TRIMETH/SULFA <=10 SENSITIVE Sensitive     CLINDAMYCIN >=8 RESISTANT Resistant     RIFAMPIN <=0.5 SENSITIVE Sensitive     Inducible  Clindamycin NEGATIVE Sensitive     * METHICILLIN RESISTANT STAPHYLOCOCCUS AUREUS  Blood Culture (routine x 2)     Status: Abnormal   Collection Time: 07/08/19 12:14 AM   Specimen: BLOOD  Result Value Ref Range Status   Specimen Description   Final    BLOOD RIGHT ANTECUBITAL Performed at [facility], [address] [place], [place] [address]    Special Requests   Final  BOTTLES DRAWN AEROBIC AND ANAEROBIC Blood Culture results may not be optimal due to an excessive volume of blood received in culture bottles Performed at [facility], [address] [place], [place] [address]    Culture  Setup Time   Final    GRAM POSITIVE COCCI IN BOTH AEROBIC AND ANAEROBIC BOTTLES CRITICAL VALUE NOTED.  VALUE IS CONSISTENT WITH PREVIOUSLY REPORTED AND CALLED VALUE. Performed at [facility], [address] [place], [place] [address]    Culture (A)  Final    STAPHYLOCOCCUS AUREUS SUSCEPTIBILITIES PERFORMED ON PREVIOUS CULTURE WITHIN THE LAST 5 DAYS. Performed at [facility], [address] [place], [place] [address]    Report Status 07/10/2019 FINAL  Final  Blood Culture ID Panel (Reflexed)     Status: Abnormal   Collection Time: 07/08/19 12:14 AM  Result Value Ref Range Status   Enterococcus species NOT DETECTED NOT DETECTED Final   Listeria monocytogenes NOT DETECTED NOT DETECTED Final   Staphylococcus species DETECTED (A) NOT DETECTED Final    Comment: CRITICAL RESULT CALLED TO, READ BACK BY AND VERIFIED WITH: CHARLES SHANLEVER AT 1136 ON 07/08/19 SNG    Staphylococcus aureus (BCID) DETECTED (A) NOT DETECTED Final    Comment: Methicillin (oxacillin)-resistant Staphylococcus aureus (MRSA). MRSA is predictably resistant to beta-lactam antibiotics (except ceftaroline). Preferred therapy is vancomycin unless clinically contraindicated. Patient requires contact precautions if  hospitalized. CRITICAL RESULT CALLED TO, READ BACK BY AND VERIFIED  WITH: CHARLES SHANLEVER AT 1136 ON 07/08/19 SNG    Methicillin resistance DETECTED (A) NOT DETECTED Final    Comment: CRITICAL RESULT CALLED TO, READ BACK BY AND VERIFIED WITH: CHARLES SHANLEVER AT 1136 ON 07/08/19 SNG    Streptococcus species NOT DETECTED NOT DETECTED Final   Streptococcus agalactiae NOT DETECTED NOT DETECTED Final   Streptococcus pneumoniae NOT DETECTED NOT DETECTED Final   Streptococcus pyogenes NOT DETECTED NOT DETECTED Final   Acinetobacter baumannii NOT DETECTED NOT DETECTED Final   Enterobacteriaceae species NOT DETECTED NOT DETECTED Final   Enterobacter cloacae complex NOT DETECTED NOT DETECTED Final   Escherichia coli NOT DETECTED NOT DETECTED Final   Klebsiella oxytoca NOT DETECTED NOT DETECTED Final   Klebsiella pneumoniae NOT DETECTED NOT DETECTED Final   Proteus species NOT DETECTED NOT DETECTED Final   Serratia marcescens NOT DETECTED NOT DETECTED Final   Haemophilus influenzae NOT DETECTED NOT DETECTED Final   Neisseria meningitidis NOT DETECTED NOT DETECTED Final   Pseudomonas aeruginosa NOT DETECTED NOT DETECTED Final   Candida albicans NOT DETECTED NOT DETECTED Final   Candida glabrata NOT DETECTED NOT DETECTED Final   Candida krusei NOT DETECTED NOT DETECTED Final   Candida parapsilosis NOT DETECTED NOT DETECTED Final   Candida tropicalis NOT DETECTED NOT DETECTED Final    Comment: Performed at [facility], [address] [place], [place] [address]  Respiratory Panel by RT PCR (Flu A&B, Covid) - Nasopharyngeal Swab     Status: None   Collection Time: 07/08/19  1:18 AM   Specimen: Nasopharyngeal Swab  Result Value Ref Range Status   SARS Coronavirus 2 by RT PCR NEGATIVE NEGATIVE Final    Comment: (NOTE) SARS-CoV-2 target nucleic acids are NOT DETECTED. The SARS-CoV-2 RNA is generally detectable in upper respiratoy specimens during the acute phase of infection. The lowest concentration of SARS-CoV-2 viral copies this assay can detect  is 131 copies/mL. A negative result does not preclude SARS-Cov-2 infection and should not be used as the sole basis for treatment or  other patient management decisions. A negative result may occur with  improper specimen collection/handling, submission of specimen other than nasopharyngeal swab, presence of viral mutation(s) within the areas targeted by this assay, and inadequate number of viral copies (<131 copies/mL). A negative result must be combined with clinical observations, patient history, and epidemiological information. The expected result is Negative. Fact Sheet for Patients:  PinkCheek.be Fact Sheet for Healthcare Providers:  GravelBags.it This test is not yet ap proved or cleared by the [place] FDA and  has been authorized for detection and/or diagnosis of SARS-CoV-2 by FDA under an Emergency Use Authorization (EUA). This EUA will remain  in effect (meaning this test can be used) for the duration of the COVID-19 declaration under Section 564(b)(1) of the Act, 21 U.S.C. section 360bbb-3(b)(1), unless the authorization is terminated or revoked sooner.    Influenza A by PCR NEGATIVE NEGATIVE Final   Influenza B by PCR NEGATIVE NEGATIVE Final    Comment: (NOTE) The Xpert Xpress SARS-CoV-2/FLU/RSV assay is intended as an aid in  the diagnosis of influenza from Nasopharyngeal swab specimens and  should not be used as a sole basis for treatment. Nasal washings and  aspirates are unacceptable for Xpert Xpress SARS-CoV-2/FLU/RSV  testing. Fact Sheet for Patients: PinkCheek.be Fact Sheet for Healthcare Providers: GravelBags.it This test is not yet approved or cleared by the [place] FDA and  has been authorized for detection and/or diagnosis of SARS-CoV-2 by  FDA under an Emergency Use Authorization (EUA). This EUA will remain  in effect (meaning this  test can be used) for the duration of the  Covid-19 declaration under Section 564(b)(1) of the Act, 21  U.S.C. section 360bbb-3(b)(1), unless the authorization is  terminated or revoked. Performed at [facility], [address] [place], [place] [address]   MRSA PCR Screening     Status: Abnormal   Collection Time: 07/08/19  7:51 AM   Specimen: Nasal Mucosa; Nasopharyngeal  Result Value Ref Range Status   MRSA by PCR POSITIVE (A) NEGATIVE Final    Comment:        The GeneXpert MRSA Assay (FDA approved for NASAL specimens only), is one component of a comprehensive MRSA colonization surveillance program. It is not intended to diagnose MRSA infection nor to guide or monitor treatment for MRSA infections. RESULT CALLED TO, READ BACK BY AND VERIFIED WITH: VICK GREGORY AT 2536 07/08/19.PMF Performed at [facility], [address] [place], [place] [address]   Culture, blood (Routine X 2) w Reflex to ID Panel     Status: None   Collection Time: 07/09/19  2:02 PM   Specimen: BLOOD  Result Value Ref Range Status   Specimen Description BLOOD BLOOD RIGHT HAND  Final   Special Requests   Final    BOTTLES DRAWN AEROBIC AND ANAEROBIC Blood Culture adequate volume   Culture   Final    NO GROWTH 5 DAYS Performed at [facility], [address], [place], [place] [address]    Report Status 07/14/2019 FINAL  Final  Culture, blood (Routine X 2) w Reflex to ID Panel     Status: None   Collection Time: 07/09/19  3:57 PM   Specimen: BLOOD  Result Value Ref Range Status   Specimen Description BLOOD BLOOD RIGHT ARM  Final   Special Requests   Final    BOTTLES DRAWN AEROBIC ONLY Blood Culture adequate volume   Culture   Final    NO GROWTH 5 DAYS Performed at  [facility], [address], [place], [place] [address]    Report Status 07/14/2019 FINAL  Final     Studies: DG Abd 2 Views  Result Date: 07/13/2019 CLINICAL DATA:  Abdominal pain EXAM:  ABDOMEN - 2 VIEW COMPARISON:  02/24/2013 FINDINGS: Scattered large and small bowel gas is noted. Mild small bowel prominence is noted. InterStim device is noted in the left hemipelvis. No free air is seen. IMPRESSION: Scattered large and small bowel gas with mild small bowel dilatation. This may represent a small bowel ileus. No definitive obstructive changes are seen. Electronically Signed   By: Alcide Clever M.D.   On: 07/13/2019 14:47    Scheduled Meds: . busPIRone  10 mg Oral BID  . Chlorhexidine Gluconate Cloth  6 each Topical Daily  . famotidine  20 mg Oral BID  . insulin aspart  0-20 Units Subcutaneous Q4H  . insulin detemir  22 Units Subcutaneous BID  . levothyroxine  88 mcg Oral QAC breakfast  . mouth rinse  15 mL Mouth Rinse BID  . methylPREDNISolone (SOLU-MEDROL) injection  40 mg Intravenous Daily  . mupirocin ointment  1 application Nasal BID  . phosphorus  250 mg Oral BID AC  . pramipexole  0.125 mg Oral BID  . Ensure Max Protein  11 oz Oral TID BM  . sodium chloride flush  10-40 mL Intracatheter Q12H   Continuous Infusions: . sodium chloride Stopped (07/11/19 1934)  . amiodarone Stopped (07/14/19 0547)  . heparin 1,900 Units/hr (07/14/19 1124)  . vancomycin 750 mg (07/14/19 1206)    Assessment/Plan:  1. MRSA sepsis present on admission.  On vancomycin.  Will need TEE to determine length of therapy. 2. Nausea vomiting diarrhea and abdominal pain.  Ileus seen on x-ray yesterday.  As needed nausea medications.  Patient still having abdominal pain today.  NG tube placed, abdominal x-ray flat and upright ordered.  Consulted general surgery.  May end up needing a CT scan.  Patient n.p.o. 3. Right arm weakness.  If patient needs a CT scan of the abdomen I will also do a CT scan of the head.  Since her shoulder pain is better today she was able to move her right arm for me better today than yesterday. 4. Splenic infact-continue heparin drip. 5. Rheumatoid arthritis flare with  bilateral shoulder pain.  Continue Solu-Medrol. 6. Morbid obesity with a BMI of 50.22 7. Hypothyroidism unspecified on levothyroxine 8. Type 2 diabetes mellitus on detemir insulin and sliding scale 9. Anxiety depression continue psychiatric medications. 10. Brief atrial fibrillation last night.  Started on amnio drip but then it converted to normal sinus rhythm with bradycardia.  Now sinus rhythm.  Code Status:     Code Status Orders  (From admission, onward)         Start     Ordered   07/08/19 0337  Full code  Continuous     07/08/19 0340        Code Status History    Date Active Date Inactive Code Status Order ID Comments User Context   04/07/2019 0552 05/04/2019 2052 Full Code [id number]  Lorretta Harp, MD ED   03/27/2019 1419 03/31/2019 1608 Full Code [id number]  Coralie Keens, MD Inpatient   03/27/2019 0446 03/27/2019 1402 Full Code [id number]  Mansy, Vernetta Honey, MD ED   Advance Care Planning Activity     Family Communication: Spoke with the patient's mother at the bedside Disposition Plan: With the patient's ileus and unable to tolerate food,  the patient will be in the hospital longer.  The patient will also need a TEE prior to disposition  Consultants:  Cardiology  Infectious disease  Antibiotics:  IV vancomycin  Time spent: 28 minutes  Shylie [employer]

## 2019-07-14 NOTE — Progress Notes (Signed)
ANTICOAGULATION CONSULT NOTE - Initial Consult  Pharmacy Consult for Heparin Indication: splenic infarct   Allergies  Allergen Reactions  . Cephalexin Hives  . Codeine Palpitations, Nausea Only, Nausea And Vomiting, Rash and Shortness Of Breath    "makes heart fly, she gets flushed and passes out"  . Doxycycline Rash  . Propoxyphene Rash and Shortness Of Breath    Increase heart rate  . Sulfa Antibiotics Palpitations, Nausea Only, Shortness Of Breath and Hives    "makes heart fly, she gets flushed and passes out"  . Lovenox [Enoxaparin Sodium] Hives  . Hydrocodone Nausea And Vomiting    Hear racing & breaks out into a cold sweat.  . Meropenem Rash    Erythematous, hot, pruritic rash over arms, chest, back, abdomen, and face occurred at the end of meropenem infusion on 02/22/18    Patient Measurements: Height: 5\' 10"  (177.8 cm) Weight: (!) 350 lb (158.8 kg) IBW/kg (Calculated) : 68.5 HEPARIN DW (KG): 107.6  Vital Signs: BP: 129/69 (03/27 1500) Pulse Rate: 60 (03/27 1500)  Labs: Recent Labs    07/12/19 0345 07/12/19 0345 07/13/19 0406 07/13/19 2326 07/14/19 0258 07/14/19 0505 07/14/19 1023 07/14/19 1759  HGB 9.2*   < > 9.3*  --   --  9.4*  --   --   HCT 29.9*  --  30.9*  --   --  32.5*  --   --   PLT 178  --  209  --   --  233  --   --   HEPARINUNFRC  --   --   --   --  0.29*  --  0.25* 0.33  CREATININE 0.79   < > 0.66 0.54  --  0.60  --   --   TROPONINIHS  --   --   --   --   --  17  --   --    < > = values in this interval not displayed.    Estimated Creatinine Clearance: 128.1 mL/min (by C-G formula based on SCr of 0.6 mg/dL).   Medical History: Past Medical History:  Diagnosis Date  . Abdominal wall hernia 01/29/2013  . Anxiety   . Arthritis    Rheumatoid  . C. difficile colitis   . Chronic diastolic heart failure (HCC)   . COVID-19 03/23/2019   Diagnosed at [facility] (send-out) on 03/23/2019  . Depression   . Diabetes mellitus    states no meds or diet  restrictions  at present  . Diastolic CHF (HCC)   . Esophagitis   . Fluid retention   . GERD (gastroesophageal reflux disease)   . Hiatal hernia   . Hypertension   . Hypokalemia due to loss of potassium 10/21/2015   Overview:  Associated with 3 weeks of diarrhea  And QT prolongation.  . Hypothyroidism   . IBS (irritable bowel syndrome)   . Moderate episode of recurrent major depressive disorder (HCC) 06/03/2004  . Morbid obesity (HCC)   . MRSA (methicillin resistant Staphylococcus aureus) infection 11/2017   left inner thigh abcess  . Neurogenic bladder    has pacemaker  . Neuropathy   . Obesity   . Panic attacks   . Pneumonia due to COVID-19 virus   . Rheumatoid arthritis (HCC)   . Sleep apnea    STATES SEVERE, CANT TOLERATE MASK- LAST STUDY YEARS AGO    Medications:  Medications Prior to Admission  Medication Sig Dispense Refill Last Dose  . ALPRAZolam (XANAX) 0.5 MG tablet  Take 1 tablet (0.5 mg total) by mouth 2 (two) times daily as needed for anxiety. 10 tablet 0 Unknown at PRN  . ARIPiprazole (ABILIFY) 5 MG tablet TAKE 1 TABLET BY MOUTH ONCE DAILY. (Patient taking differently: Take 5 mg by mouth daily. ) 28 tablet 11 24+ hours at Unknown  . atorvastatin (LIPITOR) 80 MG tablet Take 80 mg by mouth daily at 6 PM.    72+ hours at Unknown  . benzonatate (TESSALON PERLES) 100 MG capsule Take 1 capsule (100 mg total) by mouth 3 (three) times daily as needed for cough. 30 capsule 0 Unknown at PRN  . budesonide (PULMICORT) 0.5 MG/2ML nebulizer solution Take 2 mLs (0.5 mg total) by nebulization 2 (two) times daily. 120 mL 11 72+ hours at Unknown  . busPIRone (BUSPAR) 10 MG tablet TAKE 1 TABLET BY MOUTH TWICE DAILY (Patient taking differently: Take 10 mg by mouth 2 (two) times daily. ) 56 tablet 11 72+ hours at Unknown  . Cholecalciferol (VITAMIN D3) 125 MCG (5000 UT) CAPS Take 1 capsule (5,000 Units total) by mouth daily with breakfast. Take along with calcium and magnesium. 90 capsule 3  72+ hours at Unknown  . Ensure Max Protein (ENSURE MAX PROTEIN) LIQD Take 330 mLs (11 oz total) by mouth 2 (two) times daily between meals. 10000 mL 0   . famotidine (PEPCID) 20 MG tablet Take 1 tablet (20 mg total) by mouth daily. 30 tablet 0 72+ hours at Unknown  . FLUoxetine (PROZAC) 20 MG capsule Take 1 capsule (20 mg total) by mouth daily. TAKE (1) CAPSULE BY MOUTH ONCE DAILY. (Patient taking differently: Take 20 mg by mouth daily. ) 30 capsule 6 72+ hours at Unknown  . folic acid (FOLVITE) 1 MG tablet Take 1 mg by mouth daily.    72+ hours at Unknown  . furosemide (LASIX) 20 MG tablet Take 1 tablet (20 mg total) by mouth daily as needed for fluid or edema (weight gain >3 lbs in 1 day or >5 lbs in 2 days). 30 tablet 0 Unknown at PRN  . GNP CALCIUM CITRATE+D MAXIMUM 315-250 MG-UNIT TABS Take 1 tablet by mouth daily.   72+ hours at Unknown  . Insulin Degludec (TRESIBA FLEXTOUCH) 200 UNIT/ML SOPN Inject 65-100 Units into the skin daily.   72+ hours at Unknown  . insulin lispro (HUMALOG) 100 UNIT/ML injection Inject 20 Units into the skin 3 (three) times daily before meals.     Marland Kitchen levothyroxine (SYNTHROID, LEVOTHROID) 88 MCG tablet Take 88 mcg by mouth daily before breakfast.   72+ hours at Unknown  . linagliptin (TRADJENTA) 5 MG TABS tablet Take 1 tablet (5 mg total) by mouth daily. 30 tablet 0 72+ hours at Unknown  . methotrexate 2.5 MG tablet Take 10 mg by mouth every Wednesday.    Past Week at Unknown time  . polyethylene glycol (MIRALAX / GLYCOLAX) 17 g packet Take 17 g by mouth daily as needed for mild constipation. 14 each 0 Unknown at PRN  . pramipexole (MIRAPEX) 0.125 MG tablet Take 0.25 mg by mouth at bedtime.    72+ hours at Unknown  . pregabalin (LYRICA) 150 MG capsule Take 1 capsule (150 mg total) by mouth 3 (three) times daily. 90 capsule 5 72+ hours at Unknown  . QUEtiapine (SEROQUEL) 300 MG tablet TAKE ONE TABLET BY MOUTH AT BEDTIME. (Patient taking differently: Take 300 mg by mouth at  bedtime. TAKE ONE TABLET BY MOUTH AT BEDTIME.) 28 tablet 11 72+ hours at Unknown  .  VESICARE 5 MG tablet Take 5 mg by mouth daily.   72+ hours at Unknown  . zinc sulfate 220 (50 Zn) MG capsule Take 1 capsule (220 mg total) by mouth daily. 30 capsule 0 72+ hours at Unknown  . zolpidem (AMBIEN) 5 MG tablet Take 1 tablet (5 mg total) by mouth at bedtime. TAKE 1 TABLET BY MOUTH EVERYDAY AT BEDTIME (Patient taking differently: Take 5 mg by mouth at bedtime. ) 30 tablet 5 72+ hours at Unknown  . ascorbic acid (VITAMIN C) 500 MG tablet Take 1 tablet (500 mg total) by mouth daily. (Patient not taking: Reported on 07/11/2019) 30 tablet 0 Not Taking at Unknown time  . ciclesonide (ALVESCO) 160 MCG/ACT inhaler Inhale 4 puffs into the lungs 2 (two) times daily. (Patient not taking: Reported on 07/11/2019) 1 Inhaler 0 Not Taking at Unknown time  . dexamethasone (DECADRON) 1 MG tablet Take 2 mg daily for 3days,Take 1mg  daily for 3 days, Take 1mg  every other day for 3days,then stop (Patient not taking: Reported on 07/11/2019) 12 tablet 0 Not Taking at Unknown time  . ipratropium-albuterol (DUONEB) 0.5-2.5 (3) MG/3ML SOLN Take 3 mLs by nebulization 3 (three) times daily. (Patient not taking: Reported on 07/11/2019) 360 mL 0 Not Taking at Unknown time  . oxyCODONE (OXY IR/ROXICODONE) 5 MG immediate release tablet Take 1 tablet (5 mg total) by mouth every 6 (six) hours as needed for severe pain. Must last 30 days 120 tablet 0     Assessment: Patient is a 59 y/o F with a medical history including hypertension, type-2 diabetes, hypothyroidism, rheumatoid arthritis, diastolic heart failure, morbid obesity, hx of hospitalization in 03/2019 for ARDS secondary to COVID-19 disease who was admitted with septic shock secondary to MRSA bacteremia. Prior imaging concerning for possible splenic infarcts. Pharmacy has been consulted to initiate heparin drip for splenic infarct.  3/27 0258 HL 0.29  3/27 1023 HL 0.25   Goal of Therapy:   Heparin level 0.3-0.7 units/ml Monitor platelets by anticoagulation protocol: Yes   Plan:  Heparin level is subtherapeutic. Will increase rate to 1900 units/hr and will recheck HL in 6 hours. CBC stable. will continue to monitor.  3/27:  HL @ 1759 = 0.33  Will continue pt on current rate and recheck HL in 6 hrs on 3/28 @ 0000.    Horace Pharmacist 07/14/2019,6:24 PM

## 2019-07-14 NOTE — Progress Notes (Signed)
ANTICOAGULATION CONSULT NOTE - Initial Consult  Pharmacy Consult for Heparin Indication: splenic infarct   Allergies  Allergen Reactions  . Cephalexin Hives  . Codeine Palpitations, Nausea Only, Nausea And Vomiting, Rash and Shortness Of Breath    "makes heart fly, she gets flushed and passes out"  . Doxycycline Rash  . Propoxyphene Rash and Shortness Of Breath    Increase heart rate  . Sulfa Antibiotics Palpitations, Nausea Only, Shortness Of Breath and Hives    "makes heart fly, she gets flushed and passes out"  . Lovenox [Enoxaparin Sodium] Hives  . Hydrocodone Nausea And Vomiting    Hear racing & breaks out into a cold sweat.  . Meropenem Rash    Erythematous, hot, pruritic rash over arms, chest, back, abdomen, and face occurred at the end of meropenem infusion on 02/22/18    Patient Measurements: Height: 5\' 10"  (177.8 cm) Weight: (!) 350 lb (158.8 kg) IBW/kg (Calculated) : 68.5 HEPARIN DW (KG): 107.6  Vital Signs: BP: 132/67 (03/27 1000) Pulse Rate: 66 (03/27 1000)  Labs: Recent Labs    07/12/19 0345 07/12/19 0345 07/13/19 0406 07/13/19 2326 07/14/19 0258 07/14/19 0505 07/14/19 1023  HGB 9.2*   < > 9.3*  --   --  9.4*  --   HCT 29.9*  --  30.9*  --   --  32.5*  --   PLT 178  --  209  --   --  233  --   HEPARINUNFRC  --   --   --   --  0.29*  --  0.25*  CREATININE 0.79   < > 0.66 0.54  --  0.60  --   TROPONINIHS  --   --   --   --   --  17  --    < > = values in this interval not displayed.    Estimated Creatinine Clearance: 128.1 mL/min (by C-G formula based on SCr of 0.6 mg/dL).   Medical History: Past Medical History:  Diagnosis Date  . Abdominal wall hernia 01/29/2013  . Anxiety   . Arthritis    Rheumatoid  . C. difficile colitis   . Chronic diastolic heart failure ([place])   . COVID-19 03/23/2019   Diagnosed at [facility] (send-out) on 03/23/2019  . Depression   . Diabetes mellitus    states no meds or diet restrictions  at present  . Diastolic CHF  ([place])   . Esophagitis   . Fluid retention   . GERD (gastroesophageal reflux disease)   . Hiatal hernia   . Hypertension   . Hypokalemia due to loss of potassium 10/21/2015   Overview:  Associated with 3 weeks of diarrhea  And QT prolongation.  . Hypothyroidism   . IBS (irritable bowel syndrome)   . Moderate episode of recurrent major depressive disorder ([place]) 06/03/2004  . Morbid obesity ([place])   . MRSA (methicillin resistant Staphylococcus aureus) infection 11/2017   left inner thigh abcess  . Neurogenic bladder    has pacemaker  . Neuropathy   . Obesity   . Panic attacks   . Pneumonia due to COVID-19 virus   . Rheumatoid arthritis ([place])   . Sleep apnea    STATES SEVERE, CANT TOLERATE MASK- LAST STUDY YEARS AGO    Medications:  Medications Prior to Admission  Medication Sig Dispense Refill Last Dose  . ALPRAZolam (XANAX) 0.5 MG tablet Take 1 tablet (0.5 mg total) by mouth 2 (two) times daily as needed for anxiety. 10 tablet  0 Unknown at PRN  . ARIPiprazole (ABILIFY) 5 MG tablet TAKE 1 TABLET BY MOUTH ONCE DAILY. (Patient taking differently: Take 5 mg by mouth daily. ) 28 tablet 11 24+ hours at Unknown  . atorvastatin (LIPITOR) 80 MG tablet Take 80 mg by mouth daily at 6 PM.    72+ hours at Unknown  . benzonatate (TESSALON PERLES) 100 MG capsule Take 1 capsule (100 mg total) by mouth 3 (three) times daily as needed for cough. 30 capsule 0 Unknown at PRN  . budesonide (PULMICORT) 0.5 MG/2ML nebulizer solution Take 2 mLs (0.5 mg total) by nebulization 2 (two) times daily. 120 mL 11 72+ hours at Unknown  . busPIRone (BUSPAR) 10 MG tablet TAKE 1 TABLET BY MOUTH TWICE DAILY (Patient taking differently: Take 10 mg by mouth 2 (two) times daily. ) 56 tablet 11 72+ hours at Unknown  . Cholecalciferol (VITAMIN D3) 125 MCG (5000 UT) CAPS Take 1 capsule (5,000 Units total) by mouth daily with breakfast. Take along with calcium and magnesium. 90 capsule 3 72+ hours at Unknown  . Ensure Max Protein  (ENSURE MAX PROTEIN) LIQD Take 330 mLs (11 oz total) by mouth 2 (two) times daily between meals. 10000 mL 0   . famotidine (PEPCID) 20 MG tablet Take 1 tablet (20 mg total) by mouth daily. 30 tablet 0 72+ hours at Unknown  . FLUoxetine (PROZAC) 20 MG capsule Take 1 capsule (20 mg total) by mouth daily. TAKE (1) CAPSULE BY MOUTH ONCE DAILY. (Patient taking differently: Take 20 mg by mouth daily. ) 30 capsule 6 72+ hours at Unknown  . folic acid (FOLVITE) 1 MG tablet Take 1 mg by mouth daily.    72+ hours at Unknown  . furosemide (LASIX) 20 MG tablet Take 1 tablet (20 mg total) by mouth daily as needed for fluid or edema (weight gain >3 lbs in 1 day or >5 lbs in 2 days). 30 tablet 0 Unknown at PRN  . GNP CALCIUM CITRATE+D MAXIMUM 315-250 MG-UNIT TABS Take 1 tablet by mouth daily.   72+ hours at Unknown  . Insulin Degludec (TRESIBA FLEXTOUCH) 200 UNIT/ML SOPN Inject 65-100 Units into the skin daily.   72+ hours at Unknown  . insulin lispro (HUMALOG) 100 UNIT/ML injection Inject 20 Units into the skin 3 (three) times daily before meals.     Marland Kitchen levothyroxine (SYNTHROID, LEVOTHROID) 88 MCG tablet Take 88 mcg by mouth daily before breakfast.   72+ hours at Unknown  . linagliptin (TRADJENTA) 5 MG TABS tablet Take 1 tablet (5 mg total) by mouth daily. 30 tablet 0 72+ hours at Unknown  . methotrexate 2.5 MG tablet Take 10 mg by mouth every Wednesday.    Past Week at Unknown time  . polyethylene glycol (MIRALAX / GLYCOLAX) 17 g packet Take 17 g by mouth daily as needed for mild constipation. 14 each 0 Unknown at PRN  . pramipexole (MIRAPEX) 0.125 MG tablet Take 0.25 mg by mouth at bedtime.    72+ hours at Unknown  . pregabalin (LYRICA) 150 MG capsule Take 1 capsule (150 mg total) by mouth 3 (three) times daily. 90 capsule 5 72+ hours at Unknown  . QUEtiapine (SEROQUEL) 300 MG tablet TAKE ONE TABLET BY MOUTH AT BEDTIME. (Patient taking differently: Take 300 mg by mouth at bedtime. TAKE ONE TABLET BY MOUTH AT  BEDTIME.) 28 tablet 11 72+ hours at Unknown  . VESICARE 5 MG tablet Take 5 mg by mouth daily.   72+ hours at Unknown  .  zinc sulfate 220 (50 Zn) MG capsule Take 1 capsule (220 mg total) by mouth daily. 30 capsule 0 72+ hours at Unknown  . zolpidem (AMBIEN) 5 MG tablet Take 1 tablet (5 mg total) by mouth at bedtime. TAKE 1 TABLET BY MOUTH EVERYDAY AT BEDTIME (Patient taking differently: Take 5 mg by mouth at bedtime. ) 30 tablet 5 72+ hours at Unknown  . ascorbic acid (VITAMIN C) 500 MG tablet Take 1 tablet (500 mg total) by mouth daily. (Patient not taking: Reported on 07/11/2019) 30 tablet 0 Not Taking at Unknown time  . ciclesonide (ALVESCO) 160 MCG/ACT inhaler Inhale 4 puffs into the lungs 2 (two) times daily. (Patient not taking: Reported on 07/11/2019) 1 Inhaler 0 Not Taking at Unknown time  . dexamethasone (DECADRON) 1 MG tablet Take 2 mg daily for 3days,Take 1mg  daily for 3 days, Take 1mg  every other day for 3days,then stop (Patient not taking: Reported on 07/11/2019) 12 tablet 0 Not Taking at Unknown time  . ipratropium-albuterol (DUONEB) 0.5-2.5 (3) MG/3ML SOLN Take 3 mLs by nebulization 3 (three) times daily. (Patient not taking: Reported on 07/11/2019) 360 mL 0 Not Taking at Unknown time  . oxyCODONE (OXY IR/ROXICODONE) 5 MG immediate release tablet Take 1 tablet (5 mg total) by mouth every 6 (six) hours as needed for severe pain. Must last 30 days 120 tablet 0     Assessment: Patient is a 59 y/o F with a medical history including hypertension, type-2 diabetes, hypothyroidism, rheumatoid arthritis, diastolic heart failure, morbid obesity, hx of hospitalization in 03/2019 for ARDS secondary to COVID-19 disease who was admitted with septic shock secondary to MRSA bacteremia. Prior imaging concerning for possible splenic infarcts. Pharmacy has been consulted to initiate heparin drip for splenic infarct.  3/27 0258 HL 0.29  3/27 1023 HL 0.25   Goal of Therapy:  Heparin level 0.3-0.7  units/ml Monitor platelets by anticoagulation protocol: Yes   Plan:  Heparin level is subtherapeutic. Will increase rate to 1900 units/hr and will recheck HL in 6 hours. CBC stable. will continue to monitor.  4/27, PharmD, BCPS Clinical Pharmacist 07/14/2019,11:07 AM

## 2019-07-14 NOTE — Progress Notes (Signed)
Patient started on amiodarone 150 mg IV x 1, 60 mg/hr x 6 hours, 30 mg/hr x 18 or as needed for new onset afib w/ RVR.  No significant drug-drug interactions aside from amio + famotidine which may increase famotidine concentrations to increase risk of QTc prolongation. Patient is currently in sinus rhythm. Will continue to monitor.  Thomasene Ripple, PharmD, BCPS Clinical Pharmacist 07/14/2019

## 2019-07-14 NOTE — Progress Notes (Signed)
ANTICOAGULATION CONSULT NOTE - Initial Consult  Pharmacy Consult for Heparin Indication: splenic infarct   Allergies  Allergen Reactions  . Cephalexin Hives  . Codeine Palpitations, Nausea Only, Nausea And Vomiting, Rash and Shortness Of Breath    "makes heart fly, she gets flushed and passes out"  . Doxycycline Rash  . Propoxyphene Rash and Shortness Of Breath    Increase heart rate  . Sulfa Antibiotics Palpitations, Nausea Only, Shortness Of Breath and Hives    "makes heart fly, she gets flushed and passes out"  . Lovenox [Enoxaparin Sodium] Hives  . Hydrocodone Nausea And Vomiting    Hear racing & breaks out into a cold sweat.  . Meropenem Rash    Erythematous, hot, pruritic rash over arms, chest, back, abdomen, and face occurred at the end of meropenem infusion on 02/22/18    Patient Measurements: Height: 5\' 10"  (177.8 cm) Weight: (!) 350 lb (158.8 kg) IBW/kg (Calculated) : 68.5 HEPARIN DW (KG): 107.6  Vital Signs: Temp: 98.1 F (36.7 C) (03/26 2000) Temp Source: Oral (03/26 2000) BP: 104/56 (03/27 0200) Pulse Rate: 63 (03/27 0200)  Labs: Recent Labs    07/11/19 0440 07/11/19 0440 07/12/19 0345 07/13/19 0406 07/13/19 2326 07/14/19 0258  HGB 9.2*   < > 9.2* 9.3*  --   --   HCT 30.5*  --  29.9* 30.9*  --   --   PLT 194  --  178 209  --   --   HEPARINUNFRC  --   --   --   --   --  0.29*  CREATININE 1.18*   < > 0.79 0.66 0.54  --    < > = values in this interval not displayed.    Estimated Creatinine Clearance: 128.1 mL/min (by C-G formula based on SCr of 0.54 mg/dL).   Medical History: Past Medical History:  Diagnosis Date  . Abdominal wall hernia 01/29/2013  . Anxiety   . Arthritis    Rheumatoid  . C. difficile colitis   . Chronic diastolic heart failure (HCC)   . COVID-19 03/23/2019   Diagnosed at [facility] (send-out) on 03/23/2019  . Depression   . Diabetes mellitus    states no meds or diet restrictions  at present  . Diastolic CHF (HCC)   .  Esophagitis   . Fluid retention   . GERD (gastroesophageal reflux disease)   . Hiatal hernia   . Hypertension   . Hypokalemia due to loss of potassium 10/21/2015   Overview:  Associated with 3 weeks of diarrhea  And QT prolongation.  . Hypothyroidism   . IBS (irritable bowel syndrome)   . Moderate episode of recurrent major depressive disorder (HCC) 06/03/2004  . Morbid obesity (HCC)   . MRSA (methicillin resistant Staphylococcus aureus) infection 11/2017   left inner thigh abcess  . Neurogenic bladder    has pacemaker  . Neuropathy   . Obesity   . Panic attacks   . Pneumonia due to COVID-19 virus   . Rheumatoid arthritis (HCC)   . Sleep apnea    STATES SEVERE, CANT TOLERATE MASK- LAST STUDY YEARS AGO    Medications:  Medications Prior to Admission  Medication Sig Dispense Refill Last Dose  . ALPRAZolam (XANAX) 0.5 MG tablet Take 1 tablet (0.5 mg total) by mouth 2 (two) times daily as needed for anxiety. 10 tablet 0 Unknown at PRN  . ARIPiprazole (ABILIFY) 5 MG tablet TAKE 1 TABLET BY MOUTH ONCE DAILY. (Patient taking differently: Take 5 mg  by mouth daily. ) 28 tablet 11 24+ hours at Unknown  . atorvastatin (LIPITOR) 80 MG tablet Take 80 mg by mouth daily at 6 PM.    72+ hours at Unknown  . benzonatate (TESSALON PERLES) 100 MG capsule Take 1 capsule (100 mg total) by mouth 3 (three) times daily as needed for cough. 30 capsule 0 Unknown at PRN  . budesonide (PULMICORT) 0.5 MG/2ML nebulizer solution Take 2 mLs (0.5 mg total) by nebulization 2 (two) times daily. 120 mL 11 72+ hours at Unknown  . busPIRone (BUSPAR) 10 MG tablet TAKE 1 TABLET BY MOUTH TWICE DAILY (Patient taking differently: Take 10 mg by mouth 2 (two) times daily. ) 56 tablet 11 72+ hours at Unknown  . Cholecalciferol (VITAMIN D3) 125 MCG (5000 UT) CAPS Take 1 capsule (5,000 Units total) by mouth daily with breakfast. Take along with calcium and magnesium. 90 capsule 3 72+ hours at Unknown  . Ensure Max Protein (ENSURE MAX  PROTEIN) LIQD Take 330 mLs (11 oz total) by mouth 2 (two) times daily between meals. 10000 mL 0   . famotidine (PEPCID) 20 MG tablet Take 1 tablet (20 mg total) by mouth daily. 30 tablet 0 72+ hours at Unknown  . FLUoxetine (PROZAC) 20 MG capsule Take 1 capsule (20 mg total) by mouth daily. TAKE (1) CAPSULE BY MOUTH ONCE DAILY. (Patient taking differently: Take 20 mg by mouth daily. ) 30 capsule 6 72+ hours at Unknown  . folic acid (FOLVITE) 1 MG tablet Take 1 mg by mouth daily.    72+ hours at Unknown  . furosemide (LASIX) 20 MG tablet Take 1 tablet (20 mg total) by mouth daily as needed for fluid or edema (weight gain >3 lbs in 1 day or >5 lbs in 2 days). 30 tablet 0 Unknown at PRN  . GNP CALCIUM CITRATE+D MAXIMUM 315-250 MG-UNIT TABS Take 1 tablet by mouth daily.   72+ hours at Unknown  . Insulin Degludec (TRESIBA FLEXTOUCH) 200 UNIT/ML SOPN Inject 65-100 Units into the skin daily.   72+ hours at Unknown  . insulin lispro (HUMALOG) 100 UNIT/ML injection Inject 20 Units into the skin 3 (three) times daily before meals.     Marland Kitchen levothyroxine (SYNTHROID, LEVOTHROID) 88 MCG tablet Take 88 mcg by mouth daily before breakfast.   72+ hours at Unknown  . linagliptin (TRADJENTA) 5 MG TABS tablet Take 1 tablet (5 mg total) by mouth daily. 30 tablet 0 72+ hours at Unknown  . methotrexate 2.5 MG tablet Take 10 mg by mouth every Wednesday.    Past Week at Unknown time  . polyethylene glycol (MIRALAX / GLYCOLAX) 17 g packet Take 17 g by mouth daily as needed for mild constipation. 14 each 0 Unknown at PRN  . pramipexole (MIRAPEX) 0.125 MG tablet Take 0.25 mg by mouth at bedtime.    72+ hours at Unknown  . pregabalin (LYRICA) 150 MG capsule Take 1 capsule (150 mg total) by mouth 3 (three) times daily. 90 capsule 5 72+ hours at Unknown  . QUEtiapine (SEROQUEL) 300 MG tablet TAKE ONE TABLET BY MOUTH AT BEDTIME. (Patient taking differently: Take 300 mg by mouth at bedtime. TAKE ONE TABLET BY MOUTH AT BEDTIME.) 28  tablet 11 72+ hours at Unknown  . VESICARE 5 MG tablet Take 5 mg by mouth daily.   72+ hours at Unknown  . zinc sulfate 220 (50 Zn) MG capsule Take 1 capsule (220 mg total) by mouth daily. 30 capsule 0 72+ hours at  Unknown  . zolpidem (AMBIEN) 5 MG tablet Take 1 tablet (5 mg total) by mouth at bedtime. TAKE 1 TABLET BY MOUTH EVERYDAY AT BEDTIME (Patient taking differently: Take 5 mg by mouth at bedtime. ) 30 tablet 5 72+ hours at Unknown  . ascorbic acid (VITAMIN C) 500 MG tablet Take 1 tablet (500 mg total) by mouth daily. (Patient not taking: Reported on 07/11/2019) 30 tablet 0 Not Taking at Unknown time  . ciclesonide (ALVESCO) 160 MCG/ACT inhaler Inhale 4 puffs into the lungs 2 (two) times daily. (Patient not taking: Reported on 07/11/2019) 1 Inhaler 0 Not Taking at Unknown time  . dexamethasone (DECADRON) 1 MG tablet Take 2 mg daily for 3days,Take 1mg  daily for 3 days, Take 1mg  every other day for 3days,then stop (Patient not taking: Reported on 07/11/2019) 12 tablet 0 Not Taking at Unknown time  . ipratropium-albuterol (DUONEB) 0.5-2.5 (3) MG/3ML SOLN Take 3 mLs by nebulization 3 (three) times daily. (Patient not taking: Reported on 07/11/2019) 360 mL 0 Not Taking at Unknown time  . oxyCODONE (OXY IR/ROXICODONE) 5 MG immediate release tablet Take 1 tablet (5 mg total) by mouth every 6 (six) hours as needed for severe pain. Must last 30 days 120 tablet 0     Assessment: Patient is a 59 y/o F with a medical history including hypertension, type-2 diabetes, hypothyroidism, rheumatoid arthritis, diastolic heart failure, morbid obesity, hx of hospitalization in 03/2019 for ARDS secondary to COVID-19 disease who was admitted with septic shock secondary to MRSA bacteremia. Prior imaging concerning for possible splenic infarcts. Pharmacy has been consulted to initiate heparin drip for splenic infarct.  Patient was previously on heparin drip this admission due to concern for VTE and was therapeutic on 1600  units/hr.   DVT prophylaxis of SQ heparin d/c (last dose 3/26 @ 1408).   Goal of Therapy:  Heparin level 0.3-0.7 units/ml Monitor platelets by anticoagulation protocol: Yes   Plan:  03/27 @ 0300 HL 0.29 subtherapeutic. Will increase rate to 1700 units/hr and will recheck HL at 1000 CBC low stable will continue to monitor.  Tobie Lords, PharmD, BCPS Clinical Pharmacist 07/14/2019,3:50 AM

## 2019-07-15 ENCOUNTER — Inpatient Hospital Stay: Payer: Self-pay

## 2019-07-15 DIAGNOSIS — I480 Paroxysmal atrial fibrillation: Secondary | ICD-10-CM

## 2019-07-15 DIAGNOSIS — D735 Infarction of spleen: Secondary | ICD-10-CM

## 2019-07-15 DIAGNOSIS — E119 Type 2 diabetes mellitus without complications: Secondary | ICD-10-CM

## 2019-07-15 DIAGNOSIS — Z89411 Acquired absence of right great toe: Secondary | ICD-10-CM

## 2019-07-15 DIAGNOSIS — G4733 Obstructive sleep apnea (adult) (pediatric): Secondary | ICD-10-CM

## 2019-07-15 DIAGNOSIS — R29898 Other symptoms and signs involving the musculoskeletal system: Secondary | ICD-10-CM

## 2019-07-15 DIAGNOSIS — Z8614 Personal history of Methicillin resistant Staphylococcus aureus infection: Secondary | ICD-10-CM

## 2019-07-15 DIAGNOSIS — Z960 Presence of urogenital implants: Secondary | ICD-10-CM

## 2019-07-15 DIAGNOSIS — Z89421 Acquired absence of other right toe(s): Secondary | ICD-10-CM

## 2019-07-15 DIAGNOSIS — M069 Rheumatoid arthritis, unspecified: Secondary | ICD-10-CM

## 2019-07-15 LAB — CBC WITH DIFFERENTIAL/PLATELET
Abs Immature Granulocytes: 0.13 10*3/uL — ABNORMAL HIGH (ref 0.00–0.07)
Basophils Absolute: 0 10*3/uL (ref 0.0–0.1)
Basophils Relative: 0 %
Eosinophils Absolute: 0 10*3/uL (ref 0.0–0.5)
Eosinophils Relative: 0 %
HCT: 30.2 % — ABNORMAL LOW (ref 36.0–46.0)
Hemoglobin: 9 g/dL — ABNORMAL LOW (ref 12.0–15.0)
Immature Granulocytes: 1 %
Lymphocytes Relative: 11 %
Lymphs Abs: 1.2 10*3/uL (ref 0.7–4.0)
MCH: 23.1 pg — ABNORMAL LOW (ref 26.0–34.0)
MCHC: 29.8 g/dL — ABNORMAL LOW (ref 30.0–36.0)
MCV: 77.6 fL — ABNORMAL LOW (ref 80.0–100.0)
Monocytes Absolute: 0.7 10*3/uL (ref 0.1–1.0)
Monocytes Relative: 6 %
Neutro Abs: 9 10*3/uL — ABNORMAL HIGH (ref 1.7–7.7)
Neutrophils Relative %: 82 %
Platelets: 375 10*3/uL (ref 150–400)
RBC: 3.89 MIL/uL (ref 3.87–5.11)
RDW: 17 % — ABNORMAL HIGH (ref 11.5–15.5)
WBC: 11 10*3/uL — ABNORMAL HIGH (ref 4.0–10.5)
nRBC: 0 % (ref 0.0–0.2)

## 2019-07-15 LAB — BASIC METABOLIC PANEL
Anion gap: 5 (ref 5–15)
BUN: 18 mg/dL (ref 6–20)
CO2: 25 mmol/L (ref 22–32)
Calcium: 8.5 mg/dL — ABNORMAL LOW (ref 8.9–10.3)
Chloride: 108 mmol/L (ref 98–111)
Creatinine, Ser: 0.59 mg/dL (ref 0.44–1.00)
GFR calc Af Amer: >60 mL/min (ref >60)
GFR calc non Af Amer: >60 mL/min (ref >60)
Glucose, Bld: 152 mg/dL — ABNORMAL HIGH (ref 70–99)
Potassium: 3.6 mmol/L (ref 3.5–5.1)
Sodium: 138 mmol/L (ref 135–145)

## 2019-07-15 LAB — GLUCOSE, CAPILLARY
Glucose-Capillary: 119 mg/dL — ABNORMAL HIGH (ref 70–99)
Glucose-Capillary: 146 mg/dL — ABNORMAL HIGH (ref 70–99)
Glucose-Capillary: 165 mg/dL — ABNORMAL HIGH (ref 70–99)
Glucose-Capillary: 169 mg/dL — ABNORMAL HIGH (ref 70–99)
Glucose-Capillary: 186 mg/dL — ABNORMAL HIGH (ref 70–99)
Glucose-Capillary: 194 mg/dL — ABNORMAL HIGH (ref 70–99)

## 2019-07-15 LAB — VANCOMYCIN, PEAK: Vancomycin Pk: 19 ug/mL — ABNORMAL LOW (ref 30–40)

## 2019-07-15 LAB — MAGNESIUM: Magnesium: 2 mg/dL (ref 1.7–2.4)

## 2019-07-15 LAB — FIBRIN DERIVATIVES D-DIMER (ARMC ONLY): Fibrin derivatives D-dimer (ARMC): 3535.73 ng/mL (FEU) — ABNORMAL HIGH (ref 0.00–499.00)

## 2019-07-15 LAB — HEPARIN LEVEL (UNFRACTIONATED)
Heparin Unfractionated: 0.47 IU/mL (ref 0.30–0.70)
Heparin Unfractionated: 0.5 IU/mL (ref 0.30–0.70)

## 2019-07-15 LAB — PHOSPHORUS: Phosphorus: 2.9 mg/dL (ref 2.5–4.6)

## 2019-07-15 MED ORDER — SODIUM CHLORIDE 0.9% FLUSH: 10.0000 mL | INTRAVENOUS | Status: DC | PRN

## 2019-07-15 MED ORDER — VANCOMYCIN HCL 750 MG/150ML IV SOLN
750.0000 mg | Freq: Two times a day (BID) | INTRAVENOUS | Status: DC
  Administered 2019-07-15: 750 mg via INTRAVENOUS
  Filled 2019-07-15 (×3): qty 150

## 2019-07-15 MED ORDER — SODIUM CHLORIDE 0.9% FLUSH
10.0000 mL | Freq: Two times a day (BID) | INTRAVENOUS | Status: DC
  Administered 2019-07-15: 10 mL

## 2019-07-15 NOTE — Progress Notes (Signed)
Peripherally Inserted Central Catheter Placement  The IV Nurse has discussed with the patient and/or persons authorized to consent for the patient, the purpose of this procedure and the potential benefits and risks involved with this procedure.  The benefits include less needle sticks, lab draws from the catheter, and the patient may be discharged home with the catheter. Risks include, but not limited to, infection, bleeding, blood clot (thrombus formation), and puncture of an artery; nerve damage and irregular heartbeat and possibility to perform a PICC exchange if needed/ordered by physician.  Alternatives to this procedure were also discussed.  Bard Power PICC patient education guide, fact sheet on infection prevention and patient information card has been provided to patient /or left at bedside.    PICC Placement Documentation  PICC Single Lumen 07/15/19 PICC Right Cephalic 39 cm 0 cm (Active)  Indication for Insertion or Continuance of Line Prolonged intravenous therapies 07/15/19 1308  Exposed Catheter (cm) 0 cm 07/15/19 1308  Site Assessment Clean;Dry;Intact 07/15/19 1308  Line Status Flushed;Saline locked;Blood return noted 07/15/19 1308  Dressing Type Transparent;Securing device 07/15/19 1308  Dressing Status Clean;Dry;Intact;Antimicrobial disc in place;Other (Comment) 07/15/19 1308  Line Care Connections checked and tightened 07/15/19 1308  Line Adjustment (NICU/IV Team Only) No 07/15/19 1308  Dressing Intervention New dressing 07/15/19 1308  Dressing Change Due 07/22/19 07/15/19 1308       Elliot Dally 07/15/2019, 1:24 PM

## 2019-07-15 NOTE — Progress Notes (Signed)
Patient ID: [name] [name], female   DOB: 07-01-1960, 59 y.o.   MRN: [id number] [facility] PROGRESS NOTE  MALISSA LUNDQUIST PZW:[id number] DOB: 10/31/1960 DOA: 07/07/2019 PCP: [facility]  HPI/Subjective: Patient feeling a little bit better today.  Some nausea but no vomiting.  Abdominal pain a lot less.  Had some bowel movements.  No chest pain.  Right arm feeling a little bit better today.  Objective: Vitals:   07/15/19 0700 07/15/19 0800  BP: (!) 141/71 128/66  Pulse: 64 (!) 59  Resp: 18 20  Temp:    SpO2: 100% 99%    Intake/Output Summary (Last 24 hours) at 07/15/2019 1250 Last data filed at 07/15/2019 0600 Gross per 24 hour  Intake 336.94 ml  Output 2000 ml  Net -1663.06 ml   Filed Weights   07/07/19 2310  Weight: (!) 158.8 kg    ROS: Review of Systems  Constitutional: Negative for chills and fever.  Eyes: Negative for blurred vision.  Respiratory: Positive for shortness of breath. Negative for cough.   Cardiovascular: Negative for chest pain.  Gastrointestinal: Positive for abdominal pain and nausea. Negative for constipation, diarrhea and vomiting.  Genitourinary: Negative for dysuria.  Musculoskeletal: Negative for joint pain.  Neurological: Positive for focal weakness. Negative for dizziness and headaches.   Exam: Physical Exam  Constitutional: She is oriented to person, place, and time.  HENT:  Nose: No mucosal edema.  Mouth/Throat: No oropharyngeal exudate or posterior oropharyngeal edema.  Eyes: Pupils are equal, round, and reactive to light. Conjunctivae, EOM and lids are normal.  Cardiovascular: S1 normal and S2 normal. Exam reveals no gallop.  No murmur heard. Respiratory: No respiratory distress. She has decreased breath sounds in the right lower field and the left lower field. She has no wheezes. She has no rhonchi. She has no rales.  GI: Soft. She exhibits distension. There is abdominal tenderness.  Musculoskeletal:     Right ankle: No  swelling.     Left ankle: No swelling.     Comments: Patient able to move her right arm today better than yesterday.  No pain bilateral shoulders to palpation.  Neurological: She is alert and oriented to person, place, and time. No cranial nerve deficit.  Skin: Skin is warm. No rash noted. Nails show no clubbing.  Psychiatric: She has a normal mood and affect.      Data Reviewed: Basic Metabolic Panel: Recent Labs  Lab 07/11/19 0440 07/11/19 0440 07/12/19 0345 07/13/19 0406 07/13/19 2326 07/14/19 0505 07/15/19 0451  NA 141   < > 140 141 141 142 138  K 3.4*   < > 3.0* 2.9* 3.9 4.3 3.6  CL 111   < > 109 110 108 111 108  CO2 21*   < > 23 24 21* 24 25  GLUCOSE 154*   < > 161* 92 209* 217* 152*  BUN 50*   < > 32* 26* 21* 20 18  CREATININE 1.18*   < > 0.79 0.66 0.54 0.60 0.59  CALCIUM 8.3*   < > 8.4* 8.1* 8.4* 8.5* 8.5*  MG 2.7*  --  2.2 2.0 2.0  --  2.0  PHOS 4.7*  --  3.3 2.6  --  3.5 2.9   < > = values in this interval not displayed.   Liver Function Tests: Recent Labs  Lab 07/09/19 0420 07/10/19 0412 07/11/19 0440 07/12/19 0345 07/13/19 2326  AST 16 53* 40 29 18  ALT 12 30 42 39 29  ALKPHOS  67 62 59 62 70  BILITOT 0.9 0.8 0.8 0.9 0.7  PROT 6.2* 6.3* 6.5 6.4* 6.7  ALBUMIN 2.5* 2.6* 2.5* 2.5* 2.3*   Recent Labs  Lab 07/13/19 2326  LIPASE 176*   CBC: Recent Labs  Lab 07/11/19 0440 07/12/19 0345 07/13/19 0406 07/14/19 0505 07/15/19 0451  WBC 18.3* 9.8 10.4 9.7 11.0*  NEUTROABS 15.7* 8.2* 8.2* 8.4* 9.0*  HGB 9.2* 9.2* 9.3* 9.4* 9.0*  HCT 30.5* 29.9* 30.9* 32.5* 30.2*  MCV 79.2* 77.1* 79.0* 79.9* 77.6*  PLT 194 178 209 233 375   BNP (last 3 results) Recent Labs    04/29/19 0559 05/23/19 1929 07/08/19 0014  BNP 39.0 19.0 47.0    CBG: Recent Labs  Lab 07/14/19 2215 07/14/19 2353 07/15/19 0431 07/15/19 0757 07/15/19 1137  GLUCAP 212* 200* 146* 119* 186*    Recent Results (from the past 240 hour(s))  Urine culture     Status: None    Collection Time: 07/07/19 12:14 AM   Specimen: In/Out Cath Urine  Result Value Ref Range Status   Specimen Description   Final    IN/OUT CATH URINE Performed at [facility], [address] [place], [place] [address]    Special Requests   Final    NONE Performed at [facility], [address] [place], [place] [address]    Culture   Final    NO GROWTH Performed at [facility], [place] [address], [place], [place] [address]    Report Status 07/09/2019 FINAL  Final  Blood Culture (routine x 2)     Status: Abnormal   Collection Time: 07/08/19 12:14 AM   Specimen: BLOOD  Result Value Ref Range Status   Specimen Description   Final    BLOOD LEFT ANTECUBITAL Performed at [facility], [address], [place], [place] [address]    Special Requests   Final    BOTTLES DRAWN AEROBIC AND ANAEROBIC Blood Culture adequate volume Performed at [facility], [address] [place], [place] [address]    Culture  Setup Time   Final    GRAM POSITIVE COCCI IN BOTH AEROBIC AND ANAEROBIC BOTTLES CRITICAL RESULT CALLED TO, READ BACK BY AND VERIFIED WITH: ABBY ELLINGTON AT 9509 ON 07/08/19 SNG Performed at [facility], [place] [address], [place],  [address]    Culture METHICILLIN RESISTANT STAPHYLOCOCCUS AUREUS (A)  Final   Report Status 07/10/2019 FINAL  Final   Organism ID, Bacteria METHICILLIN RESISTANT STAPHYLOCOCCUS AUREUS  Final      Susceptibility   Methicillin resistant staphylococcus aureus - MIC*    CIPROFLOXACIN >=8 RESISTANT Resistant     ERYTHROMYCIN >=8 RESISTANT Resistant     GENTAMICIN <=0.5 SENSITIVE Sensitive     OXACILLIN >=4 RESISTANT Resistant     TETRACYCLINE <=1 SENSITIVE Sensitive     VANCOMYCIN 1 SENSITIVE Sensitive     TRIMETH/SULFA <=10 SENSITIVE Sensitive     CLINDAMYCIN >=8 RESISTANT Resistant     RIFAMPIN <=0.5 SENSITIVE Sensitive     Inducible Clindamycin NEGATIVE Sensitive     * METHICILLIN RESISTANT  STAPHYLOCOCCUS AUREUS  Blood Culture (routine x 2)     Status: Abnormal   Collection Time: 07/08/19 12:14 AM   Specimen: BLOOD  Result Value Ref Range Status   Specimen Description   Final    BLOOD RIGHT ANTECUBITAL Performed at [facility], [address], [place],  [address]    Special Requests   Final  BOTTLES DRAWN AEROBIC AND ANAEROBIC Blood Culture results may not be optimal due to an excessive volume of blood received in culture bottles Performed at [facility], [address] [place], [place] [address]    Culture  Setup Time   Final    GRAM POSITIVE COCCI IN BOTH AEROBIC AND ANAEROBIC BOTTLES CRITICAL VALUE NOTED.  VALUE IS CONSISTENT WITH PREVIOUSLY REPORTED AND CALLED VALUE. Performed at [facility], [address] [place], [place] [address]    Culture (A)  Final    STAPHYLOCOCCUS AUREUS SUSCEPTIBILITIES PERFORMED ON PREVIOUS CULTURE WITHIN THE LAST 5 DAYS. Performed at [facility], [address] [place], [place] [address]    Report Status 07/10/2019 FINAL  Final  Blood Culture ID Panel (Reflexed)     Status: Abnormal   Collection Time: 07/08/19 12:14 AM  Result Value Ref Range Status   Enterococcus species NOT DETECTED NOT DETECTED Final   Listeria monocytogenes NOT DETECTED NOT DETECTED Final   Staphylococcus species DETECTED (A) NOT DETECTED Final    Comment: CRITICAL RESULT CALLED TO, READ BACK BY AND VERIFIED WITH: CHARLES SHANLEVER AT 1136 ON 07/08/19 SNG    Staphylococcus aureus (BCID) DETECTED (A) NOT DETECTED Final    Comment: Methicillin (oxacillin)-resistant Staphylococcus aureus (MRSA). MRSA is predictably resistant to beta-lactam antibiotics (except ceftaroline). Preferred therapy is vancomycin unless clinically contraindicated. Patient requires contact precautions if  hospitalized. CRITICAL RESULT CALLED TO, READ BACK BY AND VERIFIED WITH: CHARLES SHANLEVER AT 1136 ON 07/08/19 SNG    Methicillin  resistance DETECTED (A) NOT DETECTED Final    Comment: CRITICAL RESULT CALLED TO, READ BACK BY AND VERIFIED WITH: CHARLES SHANLEVER AT 1136 ON 07/08/19 SNG    Streptococcus species NOT DETECTED NOT DETECTED Final   Streptococcus agalactiae NOT DETECTED NOT DETECTED Final   Streptococcus pneumoniae NOT DETECTED NOT DETECTED Final   Streptococcus pyogenes NOT DETECTED NOT DETECTED Final   Acinetobacter baumannii NOT DETECTED NOT DETECTED Final   Enterobacteriaceae species NOT DETECTED NOT DETECTED Final   Enterobacter cloacae complex NOT DETECTED NOT DETECTED Final   Escherichia coli NOT DETECTED NOT DETECTED Final   Klebsiella oxytoca NOT DETECTED NOT DETECTED Final   Klebsiella pneumoniae NOT DETECTED NOT DETECTED Final   Proteus species NOT DETECTED NOT DETECTED Final   Serratia marcescens NOT DETECTED NOT DETECTED Final   Haemophilus influenzae NOT DETECTED NOT DETECTED Final   Neisseria meningitidis NOT DETECTED NOT DETECTED Final   Pseudomonas aeruginosa NOT DETECTED NOT DETECTED Final   Candida albicans NOT DETECTED NOT DETECTED Final   Candida glabrata NOT DETECTED NOT DETECTED Final   Candida krusei NOT DETECTED NOT DETECTED Final   Candida parapsilosis NOT DETECTED NOT DETECTED Final   Candida tropicalis NOT DETECTED NOT DETECTED Final    Comment: Performed at [facility], [address] [place], [place] [address]  Respiratory Panel by RT PCR (Flu A&B, Covid) - Nasopharyngeal Swab     Status: None   Collection Time: 07/08/19  1:18 AM   Specimen: Nasopharyngeal Swab  Result Value Ref Range Status   SARS Coronavirus 2 by RT PCR NEGATIVE NEGATIVE Final    Comment: (NOTE) SARS-CoV-2 target nucleic acids are NOT DETECTED. The SARS-CoV-2 RNA is generally detectable in upper respiratoy specimens during the acute phase of infection. The lowest concentration of SARS-CoV-2 viral copies this assay can detect is 131 copies/mL. A negative result does not preclude  SARS-Cov-2 infection and should not be used as the sole basis for treatment or  other patient management decisions. A negative result may occur with  improper specimen collection/handling, submission of specimen other than nasopharyngeal swab, presence of viral mutation(s) within the areas targeted by this assay, and inadequate number of viral copies (<131 copies/mL). A negative result must be combined with clinical observations, patient history, and epidemiological information. The expected result is Negative. Fact Sheet for Patients:  https://www.moore.com/ Fact Sheet for Healthcare Providers:  https://www.young.biz/ This test is not yet ap proved or cleared by the [place] FDA and  has been authorized for detection and/or diagnosis of SARS-CoV-2 by FDA under an Emergency Use Authorization (EUA). This EUA will remain  in effect (meaning this test can be used) for the duration of the COVID-19 declaration under Section 564(b)(1) of the Act, 21 U.S.C. section 360bbb-3(b)(1), unless the authorization is terminated or revoked sooner.    Influenza A by PCR NEGATIVE NEGATIVE Final   Influenza B by PCR NEGATIVE NEGATIVE Final    Comment: (NOTE) The Xpert Xpress SARS-CoV-2/FLU/RSV assay is intended as an aid in  the diagnosis of influenza from Nasopharyngeal swab specimens and  should not be used as a sole basis for treatment. Nasal washings and  aspirates are unacceptable for Xpert Xpress SARS-CoV-2/FLU/RSV  testing. Fact Sheet for Patients: https://www.moore.com/ Fact Sheet for Healthcare Providers: https://www.young.biz/ This test is not yet approved or cleared by the [place] FDA and  has been authorized for detection and/or diagnosis of SARS-CoV-2 by  FDA under an Emergency Use Authorization (EUA). This EUA will remain  in effect (meaning this test can be used) for the duration of the  Covid-19  declaration under Section 564(b)(1) of the Act, 21  U.S.C. section 360bbb-3(b)(1), unless the authorization is  terminated or revoked. Performed at [facility], [address] [place], [place] [address]   MRSA PCR Screening     Status: Abnormal   Collection Time: 07/08/19  7:51 AM   Specimen: Nasal Mucosa; Nasopharyngeal  Result Value Ref Range Status   MRSA by PCR POSITIVE (A) NEGATIVE Final    Comment:        The GeneXpert MRSA Assay (FDA approved for NASAL specimens only), is one component of a comprehensive MRSA colonization surveillance program. It is not intended to diagnose MRSA infection nor to guide or monitor treatment for MRSA infections. RESULT CALLED TO, READ BACK BY AND VERIFIED WITH: VICK GREGORY AT 1305 07/08/19.PMF Performed at [facility], [address] [place], [place] [address]   Culture, blood (Routine X 2) w Reflex to ID Panel     Status: None   Collection Time: 07/09/19  2:02 PM   Specimen: BLOOD  Result Value Ref Range Status   Specimen Description BLOOD BLOOD RIGHT HAND  Final   Special Requests   Final    BOTTLES DRAWN AEROBIC AND ANAEROBIC Blood Culture adequate volume   Culture   Final    NO GROWTH 5 DAYS Performed at [facility], [address] [place], [place] [address]    Report Status 07/14/2019 FINAL  Final  Culture, blood (Routine X 2) w Reflex to ID Panel     Status: None   Collection Time: 07/09/19  3:57 PM   Specimen: BLOOD  Result Value Ref Range Status   Specimen Description BLOOD BLOOD RIGHT ARM  Final   Special Requests   Final    BOTTLES DRAWN AEROBIC ONLY Blood Culture adequate volume   Culture   Final    NO GROWTH 5 DAYS Performed at  [facility], [address] [place], [place] [address]    Report Status 07/14/2019 FINAL  Final     Studies: CT HEAD WO CONTRAST  Result Date: 07/14/2019 CLINICAL DATA:  Acute onset altered level of consciousness, neurologic deficit EXAM: CT  HEAD WITHOUT CONTRAST TECHNIQUE: Contiguous axial images were obtained from the base of the skull through the vertex without intravenous contrast. COMPARISON:  07/09/2019 FINDINGS: Brain: No acute infarct or hemorrhage. Lateral ventricles and midline structures are unremarkable. No acute extra-axial fluid collections. No mass effect. Vascular: No hyperdense vessel or unexpected calcification. Skull: Normal. Negative for fracture or focal lesion. Sinuses/Orbits: No acute finding. Other: None IMPRESSION: 1. Stable head CT, no acute process. Electronically Signed   By: Sharlet Salina M.D.   On: 07/14/2019 19:27   CT ABDOMEN PELVIS W CONTRAST  Result Date: 07/14/2019 CLINICAL DATA:  Acute onset of abdominal pain. Altered mental status. EXAM: CT ABDOMEN AND PELVIS WITH CONTRAST TECHNIQUE: Multidetector CT imaging of the abdomen and pelvis was performed using the standard protocol following bolus administration of intravenous contrast. CONTRAST:  OMNIPAQUE IOHEXOL 300 MG/ML  SOLN COMPARISON:  Chest and abdominal CT 6 days ago 07/08/2019. Chest CTA 07/09/2019. FINDINGS: Lower chest: Small to moderate left pleural effusion with adjacent compressive atelectasis, new from prior imaging. Mild cardiomegaly. Prominent right infrahilar node which was seen on prior exam. Hepatobiliary: Mild hepatomegaly with liver measuring 20.9 cm cranial caudal. No focal lesion. Clips in the gallbladder fossa postcholecystectomy. No biliary dilatation. Pancreas: No ductal dilatation or inflammation. Spleen: Enlarged spanning 16.6 cm AP. Multiple splenic hypodensities are better defined than on prior exam and most consistent with splenic infarcts. Splenic vein is patent. Adrenals/Urinary Tract: Normal adrenal glands. No hydronephrosis. Bilateral renal parenchymal thinning. Mild symmetric perinephric edema. No significant excretion on delayed phase imaging suggesting underlying renal dysfunction. Tiny hypodensity in the mid posterior  left kidney is too small to characterize but likely small cyst. Urinary bladder is decompressed by Foley catheter. Stomach/Bowel: Enteric tube in the stomach. Stomach mildly distended with air and ingested contents/contrast. Normal positioning of the ligament of Treitz. No small bowel obstruction or inflammation. Administered enteric contrast reaches the distal small bowel/cecum. Normal appendix. Moderate stool in the ascending colon. Transverse colon is mildly distended with air. Liquid and solid stool in the descending and sigmoid colon. Sigmoid colon is tortuous. Decreased rectal stool burden from previous. No colonic inflammation. Vascular/Lymphatic: Aortic atherosclerosis without aneurysm. The portal and splenic veins are patent. Few prominent left external iliac and inguinal nodes are again seen, unchanged from prior. No new or progressive adenopathy. Reproductive: Status post hysterectomy. No adnexal masses. Other: Small volume perihepatic and perisplenic ascites, new from prior exam. Minimal free fluid tracks into the pelvis. Right lateral abdominal wall hernia contains small amount of fluid/ascites, similar to prior exam. No bowel involvement. No free air or intra-abdominal abscess. Pre sacral stimulator with battery pack in the left gluteal soft tissues. Generalized subcutaneous edema in the flanks, slightly increased. Musculoskeletal: There are no acute or suspicious osseous abnormalities. Again seen degenerative change in the lower lumbar spine. IMPRESSION: 1. Small to moderate left pleural effusion with adjacent compressive atelectasis, new from recent chest CT. Small volume of perihepatic and perisplenic ascites, also new. 2. Splenomegaly with multiple splenic infarcts, better defined than on prior exam. 3. Improved bilateral perinephric edema, mild symmetric residual persists. Absent excretion on delayed phase imaging can be seen with underlying renal dysfunction. 4. Right lateral abdominal wall  hernia containing small  amount of fluid/ascites, similar to prior. No bowel involvement. 5. Additional chronic findings are stable. Aortic Atherosclerosis (ICD10-I70.0). Electronically Signed   By: Narda Rutherford M.D.   On: 07/14/2019 19:34   DG Abd 2 Views  Result Date: 07/13/2019 CLINICAL DATA:  Abdominal pain EXAM: ABDOMEN - 2 VIEW COMPARISON:  02/24/2013 FINDINGS: Scattered large and small bowel gas is noted. Mild small bowel prominence is noted. InterStim device is noted in the left hemipelvis. No free air is seen. IMPRESSION: Scattered large and small bowel gas with mild small bowel dilatation. This may represent a small bowel ileus. No definitive obstructive changes are seen. Electronically Signed   By: Alcide Clever M.D.   On: 07/13/2019 14:47   DG Abd Portable 2V  Result Date: 07/14/2019 CLINICAL DATA:  Abdominal pain. EXAM: PORTABLE ABDOMEN - 2 VIEW COMPARISON:  Abdominal radiograph 07/13/2019 FINDINGS: Monitoring leads overlie the patient. Enteric tube projects over the stomach. Redemonstrated gaseous distended loops of small and large bowel throughout the abdomen. Lumbar spine degenerative changes. IMPRESSION: Re demonstrated gaseous distended loops of small and large bowel favored to represent ileus. Electronically Signed   By: Annia Belt M.D.   On: 07/14/2019 12:53   [name] EKG SITE RITE  Result Date: 07/15/2019 If Site Rite image not attached, placement could not be confirmed due to current cardiac rhythm.   Scheduled Meds: . busPIRone  10 mg Oral BID  . Chlorhexidine Gluconate Cloth  6 each Topical Daily  . famotidine  20 mg Oral BID  . insulin aspart  0-20 Units Subcutaneous Q4H  . insulin detemir  22 Units Subcutaneous BID  . levothyroxine  88 mcg Oral QAC breakfast  . mouth rinse  15 mL Mouth Rinse BID  . methylPREDNISolone (SOLU-MEDROL) injection  40 mg Intravenous Daily  . pramipexole  0.125 mg Oral BID  . Ensure Max Protein  11 oz Oral TID BM  . sodium chloride flush   10-40 mL Intracatheter Q12H   Continuous Infusions: . sodium chloride Stopped (07/11/19 1934)  . amiodarone Stopped (07/14/19 0547)  . heparin 1,900 Units/hr (07/15/19 1158)  . vancomycin 750 mg (07/14/19 2229)    Assessment/Plan:  1. MRSA sepsis present on admission.  On vancomycin.  Will need TEE to determine length of therapy. 2. Ileus seen on x-ray.  CT scan of the abdomen negative..  Nausea today.  As needed nausea medications.  Advance diet to clear liquid diet today. 3. Right arm weakness.  Likely related to rheumatoid arthritis.  Moving right arm around better today.  CT scan of the head negative yesterday. 4. Splenic infarct. Continue heparin drip, likely switch over to Eliquis after TEE. 5. Rheumatoid arthritis flare with bilateral shoulder pain.  Continue Solu-Medrol. 6. Morbid obesity 7. Hypothyroidism unspecified on levothyroxine 8. Type 2 diabetes mellitus on detemir insulin and sliding scale 9. Anxiety depression continue psychiatric medications. 10. Brief atrial fibrillation.  This morning looks like sinus arrhythmia.  Code Status:     Code Status Orders  (From admission, onward)         Start     Ordered   07/08/19 0337  Full code  Continuous     07/08/19 0340        Code Status History    Date Active Date Inactive Code Status Order ID Comments User Context   04/07/2019 0552 05/04/2019 2052 Full Code [id number]  Lorretta Harp, MD ED   03/27/2019 1419 03/31/2019 1608 Full Code [id number]  Arrien, York Ram, MD Inpatient  03/27/2019 0446 03/27/2019 1402 Full Code [id number]  Mansy, Vernetta Honey, MD ED   Advance Care Planning Activity     Family Communication: Spoke with the patient's mother on the phone Disposition Plan: Ileus seems to have improved and will advance to clear liquid diet today.  Patient still will need TEE to determine length of therapy for the IV vancomycin.  We will get PICC line and try to discontinue central line in the right neck.  Get PT and OT  consultations.  Consultants:  Cardiology  Infectious disease  Antibiotics:  IV vancomycin  Time spent: 27 minutes  Lalana W[employer]ter [employer]

## 2019-07-15 NOTE — Progress Notes (Signed)
PHARMACY CONSULT NOTE  Pharmacy Consult for Electrolyte Monitoring and Replacement   Recent Labs: Potassium (mmol/L)  Date Value  07/15/2019 3.6  05/27/2014 3.8   Magnesium (mg/dL)  Date Value  40/69/8614 2.0  12/02/2013 1.1 (L)   Calcium (mg/dL)  Date Value  83/10/3541 8.5 (L)   Calcium, Total (mg/dL)  Date Value  01/48/4039 8.5   Albumin (g/dL)  Date Value  79/53/6922 2.3 (L)  11/16/2018 4.2  12/03/2013 2.7 (L)   Phosphorus (mg/dL)  Date Value  30/12/7947 2.9   Sodium (mmol/L)  Date Value  07/15/2019 138  11/16/2018 134  05/27/2014 136     Assessment: 59 year old female admitted with difficulty breathing. Patient recently hospitalized 04/06/19 to 05/04/19 post-COVID. Pharmacy consulted for management of electrolytes and glucose control.  Goal of Therapy:  Electrolytes WNL Glucose < 180 (ideally)  Plan:  Electrolytes:  - No replacement needed at this time.  -Follow-up AM labs tomorrow   Glucose (3/21 Hgb A1C 11.6%):  -Blood glucoses: 191-217  -Stress dose steroids d/c 3/25. Methylprednisolone 40 mg IV daily (day 2) -Decrease levemir to 22 units BID per diabetes coordinator recommendations -Continue SSI -On Tresiba and linagliptin PTA   Ronnald Ramp, PharmD, BCPS 07/15/2019 8:23 AM

## 2019-07-15 NOTE — Progress Notes (Signed)
Pharmacy Antibiotic Note  [name] [name] is a 59 y.o. female admitted on 07/07/2019 with sepsis.  Pharmacy has been consulted for Vancomycin dosing. Patient now with MRSA bacteremia. Patient has history of neurogenic bladder with implanted bladder stimulator. Urology stated the infection source is unlikely to be InterStim implant. Ciprofloxacin d/c per CCM. WBC trending down. Tmax 24 hours 98.30F. TTE without evidence of endocarditis. TEE recommended when patient is stable. Repeat Bcx ordered 3/22.   3/26 Vancomycin peak: 31 (drawn 2 hours after end of infusion) 3/26 Vancomycin trough: 27 (drawn 6 hours early)  Plan: Patient switched to: Vancomycin 750 mg IV Q 12 hrs. Goal AUC 400-550. Expected AUC: 456.3 Cssmin: 12.5  Calculated using 2 level kinetics: Peak (03/26 @ 1147 drawn an hour late): 31 mcg/mL, trough (03/26 @ 2015 drawn 45 minutes early): 17 mcg/mL.   Patient specific: T1/2 9.8 hrs Ke 0.0710  Will recheck VP at 03/28 @ 1600 and trough at 03/29 @ 0200 and continue to monitor. Renal function improving.  3/28: Vanc 750 mg IV X 1 given @ 1329.  Vanc peak draw @ 1619 = 19  Height: 5\' 10"  (177.8 cm) Weight: (!) 350 lb (158.8 kg) IBW/kg (Calculated) : 68.5  Temp (24hrs), Avg:97.7 F (36.5 C), Min:96.6 F (35.9 C), Max:98.2 F (36.8 C)  Recent Labs  Lab 07/10/19 1033 07/10/19 2100 07/11/19 0433 07/11/19 0440 07/11/19 0440 07/12/19 0345 07/12/19 1051 07/13/19 0406 07/13/19 1147 07/13/19 1524 07/13/19 2015 07/13/19 2326 07/14/19 0505 07/15/19 0451 07/15/19 1619  WBC  --   --   --  18.3*  --  9.8  --  10.4  --   --   --   --  9.7 11.0*  --   CREATININE  --   --   --  1.18*   < > 0.79  --  0.66  --   --   --  0.54 0.60 0.59  --   LATICACIDVEN 1.6  --   --   --   --   --   --   --   --   --   --   --   --   --   --   VANCOTROUGH  --    < >   < >  --   --   --   --   --   --  27* 17  --   --   --   --   VANCOPEAK  --    < >  --   --   --   --   --   --  31  --   --    --   --   --  53*  VANCORANDOM  --   --   --   --   --   --  17  --   --   --   --   --   --   --   --    < > = values in this interval not displayed.    Estimated Creatinine Clearance: 128.1 mL/min (by C-G formula based on SCr of 0.59 mg/dL).    Allergies  Allergen Reactions  . Cephalexin Hives  . Codeine Palpitations, Nausea Only, Nausea And Vomiting, Rash and Shortness Of Breath    "makes heart fly, she gets flushed and passes out"  . Doxycycline Rash  . Propoxyphene Rash and Shortness Of Breath    Increase heart rate  . Sulfa Antibiotics Palpitations,  Nausea Only, Shortness Of Breath and Hives    "makes heart fly, she gets flushed and passes out"  . Lovenox [Enoxaparin Sodium] Hives  . Hydrocodone Nausea And Vomiting    Hear racing & breaks out into a cold sweat.  . Meropenem Rash    Erythematous, hot, pruritic rash over arms, chest, back, abdomen, and face occurred at the end of meropenem infusion on 02/22/18    Antimicrobials this admission: Aztreonam 3/21 >> 3/21 Ciprofloxacin 3/21>> 3/22 Vancomycin 3/21>>   Dose adjustments this admission: 3/24: Vancomycin 1250 q12h >> vancomycin 1500 q24h 3/25: Vancomycin 1500 q24h >> vancomycin 1000 mg q12h  Microbiology results: 3/22 BCx: NG x 4 days 3/21 BCx: 4/4 MRSA growth 3/21 MRSA PCR: Positive  3/20 UCx: no growth    Thank you for allowing pharmacy to be a part of this patient's care.  Aren Cherne D Clinical Pharmacist 07/15/2019 6:24 PM

## 2019-07-15 NOTE — Progress Notes (Signed)
Visited with patient. She says she was feeling better today compared to other days. We talked for a few moments, I offered prayer. Patient would like another visit.

## 2019-07-15 NOTE — Progress Notes (Signed)
ANTICOAGULATION CONSULT NOTE - Initial Consult  Pharmacy Consult for Heparin Indication: splenic infarct   Allergies  Allergen Reactions  . Cephalexin Hives  . Codeine Palpitations, Nausea Only, Nausea And Vomiting, Rash and Shortness Of Breath    "makes heart fly, she gets flushed and passes out"  . Doxycycline Rash  . Propoxyphene Rash and Shortness Of Breath    Increase heart rate  . Sulfa Antibiotics Palpitations, Nausea Only, Shortness Of Breath and Hives    "makes heart fly, she gets flushed and passes out"  . Lovenox [Enoxaparin Sodium] Hives  . Hydrocodone Nausea And Vomiting    Hear racing & breaks out into a cold sweat.  . Meropenem Rash    Erythematous, hot, pruritic rash over arms, chest, back, abdomen, and face occurred at the end of meropenem infusion on 02/22/18    Patient Measurements: Height: 5\' 10"  (177.8 cm) Weight: (!) 350 lb (158.8 kg) IBW/kg (Calculated) : 68.5 HEPARIN DW (KG): 107.6  Vital Signs: Temp: 98.2 F (36.8 C) (03/28 0000) Temp Source: Oral (03/28 0000) BP: 132/62 (03/28 0200) Pulse Rate: 56 (03/28 0200)  Labs: Recent Labs    07/12/19 0345 07/12/19 0345 07/13/19 0406 07/13/19 2326 07/14/19 0258 07/14/19 0505 07/14/19 1023 07/14/19 1759 07/15/19 0005  HGB 9.2*   < > 9.3*  --   --  9.4*  --   --   --   HCT 29.9*  --  30.9*  --   --  32.5*  --   --   --   PLT 178  --  209  --   --  233  --   --   --   HEPARINUNFRC  --   --   --   --    < >  --  0.25* 0.33 0.50  CREATININE 0.79   < > 0.66 0.54  --  0.60  --   --   --   TROPONINIHS  --   --   --   --   --  17  --   --   --    < > = values in this interval not displayed.    Estimated Creatinine Clearance: 128.1 mL/min (by C-G formula based on SCr of 0.6 mg/dL).   Medical History: Past Medical History:  Diagnosis Date  . Abdominal wall hernia 01/29/2013  . Anxiety   . Arthritis    Rheumatoid  . C. difficile colitis   . Chronic diastolic heart failure ([place])   . COVID-19  03/23/2019   Diagnosed at [facility] (send[facility]out) on 03/23/2019  . Depression   . Diabetes mellitus    states no meds or diet restrictions  at present  . Diastolic CHF ([place])   . Esophagitis   . Fluid retention   . GERD (gastroesophageal reflux disease)   . Hiatal hernia   . Hypertension   . Hypokalemia due to loss of potassium 10/21/2015   Overview:  Associated with 3 weeks of diarrhea  And QT prolongation.  . Hypothyroidism   . IBS (irritable bowel syndrome)   . Moderate episode of recurrent major depressive disorder ([place]) 06/03/2004  . Morbid obesity ([place])   . MRSA (methicillin resistant Staphylococcus aureus) infection 11/2017   left inner thigh abcess  . Neurogenic bladder    has pacemaker  . Neuropathy   . Obesity   . Panic attacks   . Pneumonia due to COVID-19 virus   . Rheumatoid arthritis ([place])   . Sleep apnea  STATES SEVERE, CANT TOLERATE MASK- LAST STUDY YEARS AGO    Medications:  Medications Prior to Admission  Medication Sig Dispense Refill Last Dose  . ALPRAZolam (XANAX) 0.5 MG tablet Take 1 tablet (0.5 mg total) by mouth 2 (two) times daily as needed for anxiety. 10 tablet 0 Unknown at PRN  . ARIPiprazole (ABILIFY) 5 MG tablet TAKE 1 TABLET BY MOUTH ONCE DAILY. (Patient taking differently: Take 5 mg by mouth daily. ) 28 tablet 11 24+ hours at Unknown  . atorvastatin (LIPITOR) 80 MG tablet Take 80 mg by mouth daily at 6 PM.    72+ hours at Unknown  . benzonatate (TESSALON PERLES) 100 MG capsule Take 1 capsule (100 mg total) by mouth 3 (three) times daily as needed for cough. 30 capsule 0 Unknown at PRN  . budesonide (PULMICORT) 0.5 MG/2ML nebulizer solution Take 2 mLs (0.5 mg total) by nebulization 2 (two) times daily. 120 mL 11 72+ hours at Unknown  . busPIRone (BUSPAR) 10 MG tablet TAKE 1 TABLET BY MOUTH TWICE DAILY (Patient taking differently: Take 10 mg by mouth 2 (two) times daily. ) 56 tablet 11 72+ hours at Unknown  . Cholecalciferol (VITAMIN D3) 125 MCG (5000 UT) CAPS  Take 1 capsule (5,000 Units total) by mouth daily with breakfast. Take along with calcium and magnesium. 90 capsule 3 72+ hours at Unknown  . Ensure Max Protein (ENSURE MAX PROTEIN) LIQD Take 330 mLs (11 oz total) by mouth 2 (two) times daily between meals. 10000 mL 0   . famotidine (PEPCID) 20 MG tablet Take 1 tablet (20 mg total) by mouth daily. 30 tablet 0 72+ hours at Unknown  . FLUoxetine (PROZAC) 20 MG capsule Take 1 capsule (20 mg total) by mouth daily. TAKE (1) CAPSULE BY MOUTH ONCE DAILY. (Patient taking differently: Take 20 mg by mouth daily. ) 30 capsule 6 72+ hours at Unknown  . folic acid (FOLVITE) 1 MG tablet Take 1 mg by mouth daily.    72+ hours at Unknown  . furosemide (LASIX) 20 MG tablet Take 1 tablet (20 mg total) by mouth daily as needed for fluid or edema (weight gain >3 lbs in 1 day or >5 lbs in 2 days). 30 tablet 0 Unknown at PRN  . GNP CALCIUM CITRATE+D MAXIMUM 315-250 MG-UNIT TABS Take 1 tablet by mouth daily.   72+ hours at Unknown  . Insulin Degludec (TRESIBA FLEXTOUCH) 200 UNIT/ML SOPN Inject 65-100 Units into the skin daily.   72+ hours at Unknown  . insulin lispro (HUMALOG) 100 UNIT/ML injection Inject 20 Units into the skin 3 (three) times daily before meals.     Marland Kitchen levothyroxine (SYNTHROID, LEVOTHROID) 88 MCG tablet Take 88 mcg by mouth daily before breakfast.   72+ hours at Unknown  . linagliptin (TRADJENTA) 5 MG TABS tablet Take 1 tablet (5 mg total) by mouth daily. 30 tablet 0 72+ hours at Unknown  . methotrexate 2.5 MG tablet Take 10 mg by mouth every Wednesday.    Past Week at Unknown time  . polyethylene glycol (MIRALAX / GLYCOLAX) 17 g packet Take 17 g by mouth daily as needed for mild constipation. 14 each 0 Unknown at PRN  . pramipexole (MIRAPEX) 0.125 MG tablet Take 0.25 mg by mouth at bedtime.    72+ hours at Unknown  . pregabalin (LYRICA) 150 MG capsule Take 1 capsule (150 mg total) by mouth 3 (three) times daily. 90 capsule 5 72+ hours at Unknown  .  QUEtiapine (SEROQUEL) 300 MG  tablet TAKE ONE TABLET BY MOUTH AT BEDTIME. (Patient taking differently: Take 300 mg by mouth at bedtime. TAKE ONE TABLET BY MOUTH AT BEDTIME.) 28 tablet 11 72+ hours at Unknown  . VESICARE 5 MG tablet Take 5 mg by mouth daily.   72+ hours at Unknown  . zinc sulfate 220 (50 Zn) MG capsule Take 1 capsule (220 mg total) by mouth daily. 30 capsule 0 72+ hours at Unknown  . zolpidem (AMBIEN) 5 MG tablet Take 1 tablet (5 mg total) by mouth at bedtime. TAKE 1 TABLET BY MOUTH EVERYDAY AT BEDTIME (Patient taking differently: Take 5 mg by mouth at bedtime. ) 30 tablet 5 72+ hours at Unknown  . ascorbic acid (VITAMIN C) 500 MG tablet Take 1 tablet (500 mg total) by mouth daily. (Patient not taking: Reported on 07/11/2019) 30 tablet 0 Not Taking at Unknown time  . ciclesonide (ALVESCO) 160 MCG/ACT inhaler Inhale 4 puffs into the lungs 2 (two) times daily. (Patient not taking: Reported on 07/11/2019) 1 Inhaler 0 Not Taking at Unknown time  . dexamethasone (DECADRON) 1 MG tablet Take 2 mg daily for 3days,Take 1mg  daily for 3 days, Take 1mg  every other day for 3days,then stop (Patient not taking: Reported on 07/11/2019) 12 tablet 0 Not Taking at Unknown time  . ipratropium-albuterol (DUONEB) 0.5-2.5 (3) MG/3ML SOLN Take 3 mLs by nebulization 3 (three) times daily. (Patient not taking: Reported on 07/11/2019) 360 mL 0 Not Taking at Unknown time  . oxyCODONE (OXY IR/ROXICODONE) 5 MG immediate release tablet Take 1 tablet (5 mg total) by mouth every 6 (six) hours as needed for severe pain. Must last 30 days 120 tablet 0     Assessment: Patient is a 59 y/o F with a medical history including hypertension, type-2 diabetes, hypothyroidism, rheumatoid arthritis, diastolic heart failure, morbid obesity, hx of hospitalization in 03/2019 for ARDS secondary to COVID-19 disease who was admitted with septic shock secondary to MRSA bacteremia. Prior imaging concerning for possible splenic infarcts. Pharmacy  has been consulted to initiate heparin drip for splenic infarct.  3/27 0258 HL 0.29  3/27 1023 HL 0.25   Goal of Therapy:  Heparin level 0.3-0.7 units/ml Monitor platelets by anticoagulation protocol: Yes   Plan:  03/28 @ 0000 HL 0.50 therapeutic. Will continue current rate and will recheck w/ am labs and continue to monitor.   4/27, PharmD, BCPS Clinical Pharmacist 07/15/2019,2:36 AM

## 2019-07-15 NOTE — Progress Notes (Signed)
Date of Admission:  07/07/2019           Vancomycin 3/21 >>  ID: [name] [name] is a 59 y.o. female  Principal Problem:   Septic shock (HCC) Active Problems:   COPD (chronic obstructive pulmonary disease) (HCC)   Essential (primary) hypertension   Chronic diastolic CHF (congestive heart failure) (HCC)   Anxiety and depression   Rheumatoid arthritis (HCC)   Chronic pain syndrome   Hypothyroidism   Sepsis due to methicillin resistant Staphylococcus aureus (MRSA) without acute organ dysfunction (HCC)   Type 2 diabetes mellitus without complication, without long-term current use of insulin (HCC)   Acute metabolic encephalopathy   Hyperglycemia due to type 2 diabetes mellitus (HCC)   Morbid obesity with BMI of 50.0-59.9, adult (HCC)   History of COVID-19   Severe sepsis with septic shock (HCC)   Ileus (HCC)   Splenic infarct  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 busPIRone  10 mg Oral BID  . Chlorhexidine Gluconate Cloth  6 each Topical Daily  . famotidine  20 mg Oral BID  . insulin aspart  0-20 Units Subcutaneous Q4H  . insulin detemir  22 Units Subcutaneous BID  . levothyroxine  88 mcg Oral QAC breakfast  . mouth rinse  15 mL Mouth Rinse BID  . methylPREDNISolone (SOLU-MEDROL) injection  40 mg Intravenous Daily  . pramipexole  0.125 mg Oral BID  . Ensure Max Protein  11 oz Oral TID BM  . sodium chloride flush  10-40 mL Intracatheter Q12H    Objective: Vital  signs in last 24 hours: Temp:  [96.6 F (35.9 C)-98.2 F (36.8 C)] 98.2 F (36.8 C) (03/28 0500) Pulse Rate:  [56-71] 59 (03/28 0800) Resp:  [13-20] 20 (03/28 0800) BP: (94-149)/(57-104) 128/66 (03/28 0800) SpO2:  [96 %-100 %] 99 % (03/28 0800)  PHYSICAL EXAM:  General: Alert, cooperative, no distress, appears stated age. Morbid obesity Head: Normocephalic, without obvious abnormality, atraumatic. Eyes: Conjunctivae clear, anicteric sclerae. Pupils are equal ENT Nares normal. No drainage or sinus tenderness. Lips, mucosa, and tongue normal. No Thrush Neck: left IJ  Back: did not examine Lungs: b/l air entry Heart: s1s2 tachycardia Abdomen: Soft, pannus ,not distended. Bowel sounds normal. No masses Extremities: rt great toe and 2nd toe amputated site clean  Moves upper extremities Skin: No rashes or lesions. Or bruising Lymph: Cervical, supraclavicular normal. Neurologic: Grossly non-focal foley  Lab Results Recent Labs    07/14/19 0505 07/15/19 0451  WBC 9.7 11.0*  HGB 9.4* 9.0*  HCT 32.5* 30.2*  NA 142 138  K 4.3 3.6  CL 111 108  CO2 24 25  BUN 20 18  CREATININE 0.60 0.59   Liver Panel Recent Labs    07/13/19 2326  PROT 6.7  ALBUMIN 2.3*  AST 18  ALT 29  ALKPHOS 70  BILITOT 0.7     Microbiology: 3/21 BC-MRSA 3/22 BC-NG  Studies/Results: CT HEAD WO CONTRAST  Result Date: 07/14/2019 CLINICAL DATA:  Acute onset altered level of consciousness, neurologic deficit EXAM: CT HEAD WITHOUT CONTRAST TECHNIQUE: Contiguous axial images were obtained from the base of the skull through the vertex without intravenous contrast. COMPARISON:  07/09/2019 FINDINGS: Brain: No acute infarct or hemorrhage. Lateral ventricles and midline structures are unremarkable. No acute extra-axial fluid collections. No mass effect. Vascular: No hyperdense vessel or unexpected calcification. Skull: Normal. Negative for fracture or focal lesion. Sinuses/Orbits: No acute finding. Other:  None IMPRESSION: 1. Stable head CT, no acute process. Electronically Signed   By: Randa Ngo M.D.   On: 07/14/2019 19:27   CT ABDOMEN PELVIS W CONTRAST  Result Date: 07/14/2019 CLINICAL DATA:  Acute onset of abdominal pain. Altered mental status. EXAM: CT ABDOMEN AND PELVIS WITH CONTRAST TECHNIQUE: Multidetector CT imaging of the abdomen and pelvis was performed using the standard protocol following bolus administration of intravenous contrast. CONTRAST:  143mL OMNIPAQUE IOHEXOL 300 MG/ML  SOLN COMPARISON:  Chest and abdominal CT 6 days ago 07/08/2019. Chest CTA 07/09/2019. FINDINGS: Lower chest: Small to moderate left pleural effusion with adjacent compressive atelectasis, new from prior imaging. Mild cardiomegaly. Prominent right infrahilar node which was seen on prior exam. Hepatobiliary: Mild hepatomegaly with liver measuring 20.9 cm cranial caudal. No focal lesion. Clips in the gallbladder fossa postcholecystectomy. No biliary dilatation. Pancreas: No ductal dilatation or inflammation. Spleen: Enlarged spanning 16.6 cm AP. Multiple splenic hypodensities are better defined than on prior exam and most consistent with splenic infarcts. Splenic vein is patent. Adrenals/Urinary Tract: Normal adrenal glands. No hydronephrosis. Bilateral renal parenchymal thinning. Mild symmetric perinephric edema. No significant excretion on delayed phase imaging suggesting underlying renal dysfunction. Tiny hypodensity in the mid posterior left kidney is too small to characterize but likely small cyst. Urinary bladder is decompressed by Foley catheter. Stomach/Bowel: Enteric tube in the stomach. Stomach mildly distended with air and ingested contents/contrast. Normal positioning of the ligament of Treitz. No small bowel obstruction or inflammation. Administered enteric contrast reaches the distal small bowel/cecum. Normal appendix. Moderate stool in the ascending colon. Transverse colon is mildly distended with air. Liquid  and solid stool in the descending and sigmoid colon. Sigmoid colon is tortuous. Decreased rectal stool burden from previous. No colonic inflammation. Vascular/Lymphatic: Aortic atherosclerosis without aneurysm. The portal and splenic veins are patent. Few prominent left external iliac and inguinal nodes are again seen, unchanged from prior. No new or progressive adenopathy. Reproductive: Status post hysterectomy. No adnexal masses. Other: Small volume perihepatic and perisplenic ascites, new from prior exam. Minimal free fluid tracks into the pelvis. Right lateral abdominal wall hernia contains small amount of fluid/ascites, similar to prior exam. No bowel involvement. No free air or intra-abdominal abscess. Pre sacral stimulator with battery pack in the left gluteal soft tissues. Generalized subcutaneous edema in the flanks, slightly increased. Musculoskeletal: There are no acute or suspicious osseous abnormalities. Again seen degenerative change in the lower lumbar spine. IMPRESSION: 1. Small to moderate left pleural effusion with adjacent compressive atelectasis, new from recent chest CT. Small volume of perihepatic and perisplenic ascites, also new. 2. Splenomegaly with multiple splenic infarcts, better defined than on prior exam. 3. Improved bilateral perinephric edema, mild symmetric residual persists. Absent excretion on delayed phase imaging can be seen with underlying renal dysfunction. 4. Right lateral abdominal wall hernia containing small amount of fluid/ascites, similar to prior. No bowel involvement. 5. Additional chronic findings are stable. Aortic Atherosclerosis (ICD10-I70.0). Electronically Signed   By: Keith Rake M.D.   On: 07/14/2019 19:34   DG Abd  2 Views  Result Date: 07/13/2019 CLINICAL DATA:  Abdominal pain EXAM: ABDOMEN - 2 VIEW COMPARISON:  02/24/2013 FINDINGS: Scattered large and small bowel gas is noted. Mild small bowel prominence is noted. InterStim device is noted in the left  hemipelvis. No free air is seen. IMPRESSION: Scattered large and small bowel gas with mild small bowel dilatation. This may represent a small bowel ileus. No definitive obstructive changes are seen. Electronically Signed   By: Alcide Clever M.D.   On: 07/13/2019 14:47   DG Abd Portable 2V  Result Date: 07/14/2019 CLINICAL DATA:  Abdominal pain. EXAM: PORTABLE ABDOMEN - 2 VIEW COMPARISON:  Abdominal radiograph 07/13/2019 FINDINGS: Monitoring leads overlie the patient. Enteric tube projects over the stomach. Redemonstrated gaseous distended loops of small and large bowel throughout the abdomen. Lumbar spine degenerative changes. IMPRESSION: Re demonstrated gaseous distended loops of small and large bowel favored to represent ileus. Electronically Signed   By: Annia Belt M.D.   On: 07/14/2019 12:53   [name] EKG SITE RITE  Result Date: 07/15/2019 If Site Rite image not attached, placement could not be confirmed due to current cardiac rhythm.    Assessment/Plan: MRSA bacteremia- with splenic infarcts concern for endocarditis TEE pending Repeat BC frm 3/22 neg On vanco- will need atleast 6 weeks Trough 17 ( 07/13/19) Cr 0.56  Seen by ortho - full range of shoulder movt- septic arthritis not questioned- wonder whether the left shoulder pain she had before was referred pain from the splenic infarcts. Will keep a close eye  H/o MRSA infections in the past- Rt toes- NOV 2019, May 2020  Rheumatoid arthritis was on methotrexate-on hold now Diabetes mellitus  Foley catheter- consider Purewic  Discussed the management with patient and her nurse

## 2019-07-15 NOTE — Evaluation (Signed)
Occupational Therapy Evaluation Patient Details Name: [name] [name] MRN: [id number] DOB: 06/20/1960 Today's Date: 07/15/2019    History of Present Illness Pt admitted for septic shock secondary to MRSA bacteremia. History includes covid 63 with ARDS in Dec 2020 and since has been on 2L of O2 at baseline. Other PMH includes RA   Clinical Impression   [name] [name] was seen for OT evaluation this date. Prior to hospital admission, pt was MOD I for ADLs and functional mobility. Pt recently moved to handicap accessible house nearby family. Pt presents to acute OT demonstrating impaired ADL performance and functional mobility 2/2 decreased LB access and functional ROM/strength/balance deficits. Pt currently requires MIN A for rolling at bed level and SETUP for bed level grooming ADLs. Require +2 to assess OOB mobility, plan to return tomorrow as able. Pt would benefit from skilled OT to address noted impairments and functional limitations (see below for any additional details) in order to maximize safety and independence while minimizing falls risk and caregiver burden. Upon hospital discharge, recommend STR to maximize pt safety and return to PLOF.     Follow Up Recommendations  SNF    Equipment Recommendations       Recommendations for Other Services       Precautions / Restrictions Precautions Precautions: Fall Restrictions Weight Bearing Restrictions: No      Mobility Bed Mobility Overal bed mobility: Needs Assistance Bed Mobility: Rolling Rolling: Min assist         General bed mobility comments: SUP + L rail rolling onto L side - pt able to clear rear, MIN A to maintain position.  Transfers                 General transfer comment: unable to assess, need +2    Balance Overall balance assessment: Needs assistance                                         ADL either performed or assessed with clinical judgement   ADL Overall ADL's : Needs  assistance/impaired                                       General ADL Comments: MAX A LBD at bed level. SETUP hair brushing c L nondominant hand for majority of grooming. SETUP self-feeding at bed level     Vision         Perception     Praxis      Pertinent Vitals/Pain Pain Assessment: No/denies pain     Hand Dominance Right   Extremity/Trunk Assessment Upper Extremity Assessment Upper Extremity Assessment: RUE deficits/detail;LUE deficits/detail RUE Deficits / Details: AROM: shoulder flexion ~90*, limited digit flexion 2/2 edema LUE Deficits / Details: AROM: shoulder flexion ~120*, limited digit flexion 2/2 edema   Lower Extremity Assessment Lower Extremity Assessment: Generalized weakness       Communication Communication Communication: No difficulties   Cognition Arousal/Alertness: Awake/alert Behavior During Therapy: WFL for tasks assessed/performed Overall Cognitive Status: Within Functional Limits for tasks assessed                                     General Comments       Exercises Exercises: Other exercises Other  Exercises Other Exercises: Supine BUE THEREX: AAROM shoulder flexion and digit flexion; fist pumps, and massage for edema management Other Exercises: Pt educated re: OT role, HEP, importance of assist with bed mobility for functional strengthening, edema management   Shoulder Instructions      Home Living Family/patient expects to be discharged to:: Private residence Living Arrangements: Children(daughter) Available Help at Discharge: Family;Available 24 hours/day Type of Home: House Home Access: Ramped entrance     Home Layout: One level     Bathroom Shower/Tub: Walk-in shower;Door   [employer] Toilet: Handicapped height Bathroom Accessibility: Yes How Accessible: Accessible via walker Home Equipment: [place] - 2 wheels;Cane - single point;Shower seat - built in;Bedside commode;Wheelchair - power;Other  (comment)          Prior Functioning/Environment Level of Independence: Independent with assistive device(s)        Comments: has recently been using B SPC since she was dx with Covid along with 2L of O2. Reports no recent falls. Previously had been completely indep        OT Problem List: Decreased strength;Decreased range of motion;Decreased activity tolerance;Impaired balance (sitting and/or standing);Decreased knowledge of use of DME or AE;Increased edema      OT Treatment/Interventions: Self-care/ADL training;Therapeutic exercise;Neuromuscular education;Energy conservation;DME and/or AE instruction;Therapeutic activities;Patient/family education;Balance training    OT Goals(Current goals can be found in the care plan section) Acute Rehab OT Goals Patient Stated Goal: to be able to walk again OT Goal Formulation: With patient Time For Goal Achievement: 07/29/19 Potential to Achieve Goals: Good ADL Goals Pt Will Perform Upper Body Dressing: with set-up;sitting Pt Will Transfer to Toilet: with +2 assist;bedside commode(c LRAD PRN) Pt/caregiver will Perform Home Exercise Program: Increased ROM;Both right and left upper extremity  OT Frequency: Min 2X/week   Barriers to D/C: Decreased caregiver support          Co-evaluation              AM-PAC OT "6 Clicks" Daily Activity     Outcome Measure Help from another person eating meals?: A Little Help from another person taking care of personal grooming?: A Little Help from another person toileting, which includes using toliet, bedpan, or urinal?: A Lot Help from another person bathing (including washing, rinsing, drying)?: A Lot Help from another person to put on and taking off regular upper body clothing?: A Little Help from another person to put on and taking off regular lower body clothing?: A Lot 6 Click Score: 15   End of Session Equipment Utilized During Treatment: Oxygen(1.5 L [place])  Activity Tolerance: Patient  tolerated treatment well Patient left: in bed;with call bell/phone within reach;with bed alarm set  OT Visit Diagnosis: Muscle weakness (generalized) (M62.81);Other abnormalities of gait and mobility (R26.89)                Time: 3557-3220 OT Time Calculation (min): 15 min Charges:  OT General Charges $OT Visit: 1 Visit OT Evaluation $OT Eval Low Complexity: 1 Low OT Treatments $Self Care/Home Management : 8-22 mins  Dessie Coma, M.S. OTR/L  07/15/19, 4:03 PM

## 2019-07-15 NOTE — Progress Notes (Signed)
ANTICOAGULATION CONSULT NOTE - Initial Consult  Pharmacy Consult for Heparin Indication: splenic infarct   Allergies  Allergen Reactions  . Cephalexin Hives  . Codeine Palpitations, Nausea Only, Nausea And Vomiting, Rash and Shortness Of Breath    "makes heart fly, she gets flushed and passes out"  . Doxycycline Rash  . Propoxyphene Rash and Shortness Of Breath    Increase heart rate  . Sulfa Antibiotics Palpitations, Nausea Only, Shortness Of Breath and Hives    "makes heart fly, she gets flushed and passes out"  . Lovenox [Enoxaparin Sodium] Hives  . Hydrocodone Nausea And Vomiting    Hear racing & breaks out into a cold sweat.  . Meropenem Rash    Erythematous, hot, pruritic rash over arms, chest, back, abdomen, and face occurred at the end of meropenem infusion on 02/22/18    Patient Measurements: Height: 5\' 10"  (177.8 cm) Weight: (!) 350 lb (158.8 kg) IBW/kg (Calculated) : 68.5 HEPARIN DW (KG): 107.6  Vital Signs: Temp: 98.2 F (36.8 C) (03/28 0500) Temp Source: Oral (03/28 0500) BP: 118/76 (03/28 0600) Pulse Rate: 56 (03/28 0600)  Labs: Recent Labs    07/13/19 0406 07/13/19 0406 07/13/19 2326 07/14/19 0258 07/14/19 0505 07/14/19 1023 07/14/19 1759 07/15/19 0005 07/15/19 0451  HGB 9.3*   < >  --   --  9.4*  --   --   --  9.0*  HCT 30.9*  --   --   --  32.5*  --   --   --  30.2*  PLT 209  --   --   --  233  --   --   --  375  HEPARINUNFRC  --   --   --    < >  --    < > 0.33 0.50 0.47  CREATININE 0.66   < > 0.54  --  0.60  --   --   --  0.59  TROPONINIHS  --   --   --   --  17  --   --   --   --    < > = values in this interval not displayed.    Estimated Creatinine Clearance: 128.1 mL/min (by C-G formula based on SCr of 0.59 mg/dL).   Medical History: Past Medical History:  Diagnosis Date  . Abdominal wall hernia 01/29/2013  . Anxiety   . Arthritis    Rheumatoid  . C. difficile colitis   . Chronic diastolic heart failure ([place])   . COVID-19  03/23/2019   Diagnosed at [facility] (send[facility]out) on 03/23/2019  . Depression   . Diabetes mellitus    states no meds or diet restrictions  at present  . Diastolic CHF ([place])   . Esophagitis   . Fluid retention   . GERD (gastroesophageal reflux disease)   . Hiatal hernia   . Hypertension   . Hypokalemia due to loss of potassium 10/21/2015   Overview:  Associated with 3 weeks of diarrhea  And QT prolongation.  . Hypothyroidism   . IBS (irritable bowel syndrome)   . Moderate episode of recurrent major depressive disorder ([place]) 06/03/2004  . Morbid obesity ([place])   . MRSA (methicillin resistant Staphylococcus aureus) infection 11/2017   left inner thigh abcess  . Neurogenic bladder    has pacemaker  . Neuropathy   . Obesity   . Panic attacks   . Pneumonia due to COVID-19 virus   . Rheumatoid arthritis ([place])   . Sleep apnea  STATES SEVERE, CANT TOLERATE MASK- LAST STUDY YEARS AGO    Medications:  Medications Prior to Admission  Medication Sig Dispense Refill Last Dose  . ALPRAZolam (XANAX) 0.5 MG tablet Take 1 tablet (0.5 mg total) by mouth 2 (two) times daily as needed for anxiety. 10 tablet 0 Unknown at PRN  . ARIPiprazole (ABILIFY) 5 MG tablet TAKE 1 TABLET BY MOUTH ONCE DAILY. (Patient taking differently: Take 5 mg by mouth daily. ) 28 tablet 11 24+ hours at Unknown  . atorvastatin (LIPITOR) 80 MG tablet Take 80 mg by mouth daily at 6 PM.    72+ hours at Unknown  . benzonatate (TESSALON PERLES) 100 MG capsule Take 1 capsule (100 mg total) by mouth 3 (three) times daily as needed for cough. 30 capsule 0 Unknown at PRN  . budesonide (PULMICORT) 0.5 MG/2ML nebulizer solution Take 2 mLs (0.5 mg total) by nebulization 2 (two) times daily. 120 mL 11 72+ hours at Unknown  . busPIRone (BUSPAR) 10 MG tablet TAKE 1 TABLET BY MOUTH TWICE DAILY (Patient taking differently: Take 10 mg by mouth 2 (two) times daily. ) 56 tablet 11 72+ hours at Unknown  . Cholecalciferol (VITAMIN D3) 125 MCG (5000 UT) CAPS  Take 1 capsule (5,000 Units total) by mouth daily with breakfast. Take along with calcium and magnesium. 90 capsule 3 72+ hours at Unknown  . Ensure Max Protein (ENSURE MAX PROTEIN) LIQD Take 330 mLs (11 oz total) by mouth 2 (two) times daily between meals. 10000 mL 0   . famotidine (PEPCID) 20 MG tablet Take 1 tablet (20 mg total) by mouth daily. 30 tablet 0 72+ hours at Unknown  . FLUoxetine (PROZAC) 20 MG capsule Take 1 capsule (20 mg total) by mouth daily. TAKE (1) CAPSULE BY MOUTH ONCE DAILY. (Patient taking differently: Take 20 mg by mouth daily. ) 30 capsule 6 72+ hours at Unknown  . folic acid (FOLVITE) 1 MG tablet Take 1 mg by mouth daily.    72+ hours at Unknown  . furosemide (LASIX) 20 MG tablet Take 1 tablet (20 mg total) by mouth daily as needed for fluid or edema (weight gain >3 lbs in 1 day or >5 lbs in 2 days). 30 tablet 0 Unknown at PRN  . GNP CALCIUM CITRATE+D MAXIMUM 315-250 MG-UNIT TABS Take 1 tablet by mouth daily.   72+ hours at Unknown  . Insulin Degludec (TRESIBA FLEXTOUCH) 200 UNIT/ML SOPN Inject 65-100 Units into the skin daily.   72+ hours at Unknown  . insulin lispro (HUMALOG) 100 UNIT/ML injection Inject 20 Units into the skin 3 (three) times daily before meals.     Marland Kitchen levothyroxine (SYNTHROID, LEVOTHROID) 88 MCG tablet Take 88 mcg by mouth daily before breakfast.   72+ hours at Unknown  . linagliptin (TRADJENTA) 5 MG TABS tablet Take 1 tablet (5 mg total) by mouth daily. 30 tablet 0 72+ hours at Unknown  . methotrexate 2.5 MG tablet Take 10 mg by mouth every Wednesday.    Past Week at Unknown time  . polyethylene glycol (MIRALAX / GLYCOLAX) 17 g packet Take 17 g by mouth daily as needed for mild constipation. 14 each 0 Unknown at PRN  . pramipexole (MIRAPEX) 0.125 MG tablet Take 0.25 mg by mouth at bedtime.    72+ hours at Unknown  . pregabalin (LYRICA) 150 MG capsule Take 1 capsule (150 mg total) by mouth 3 (three) times daily. 90 capsule 5 72+ hours at Unknown  .  QUEtiapine (SEROQUEL) 300 MG  tablet TAKE ONE TABLET BY MOUTH AT BEDTIME. (Patient taking differently: Take 300 mg by mouth at bedtime. TAKE ONE TABLET BY MOUTH AT BEDTIME.) 28 tablet 11 72+ hours at Unknown  . VESICARE 5 MG tablet Take 5 mg by mouth daily.   72+ hours at Unknown  . zinc sulfate 220 (50 Zn) MG capsule Take 1 capsule (220 mg total) by mouth daily. 30 capsule 0 72+ hours at Unknown  . zolpidem (AMBIEN) 5 MG tablet Take 1 tablet (5 mg total) by mouth at bedtime. TAKE 1 TABLET BY MOUTH EVERYDAY AT BEDTIME (Patient taking differently: Take 5 mg by mouth at bedtime. ) 30 tablet 5 72+ hours at Unknown  . ascorbic acid (VITAMIN C) 500 MG tablet Take 1 tablet (500 mg total) by mouth daily. (Patient not taking: Reported on 07/11/2019) 30 tablet 0 Not Taking at Unknown time  . ciclesonide (ALVESCO) 160 MCG/ACT inhaler Inhale 4 puffs into the lungs 2 (two) times daily. (Patient not taking: Reported on 07/11/2019) 1 Inhaler 0 Not Taking at Unknown time  . dexamethasone (DECADRON) 1 MG tablet Take 2 mg daily for 3days,Take 1mg  daily for 3 days, Take 1mg  every other day for 3days,then stop (Patient not taking: Reported on 07/11/2019) 12 tablet 0 Not Taking at Unknown time  . ipratropium-albuterol (DUONEB) 0.5-2.5 (3) MG/3ML SOLN Take 3 mLs by nebulization 3 (three) times daily. (Patient not taking: Reported on 07/11/2019) 360 mL 0 Not Taking at Unknown time  . oxyCODONE (OXY IR/ROXICODONE) 5 MG immediate release tablet Take 1 tablet (5 mg total) by mouth every 6 (six) hours as needed for severe pain. Must last 30 days 120 tablet 0     Assessment: Patient is a 59 y/o F with a medical history including hypertension, type-2 diabetes, hypothyroidism, rheumatoid arthritis, diastolic heart failure, morbid obesity, hx of hospitalization in 03/2019 for ARDS secondary to COVID-19 disease who was admitted with septic shock secondary to MRSA bacteremia. Prior imaging concerning for possible splenic infarcts. Pharmacy  has been consulted to initiate heparin drip for splenic infarct.  3/27 0258 HL 0.29  3/27 1023 HL 0.25   Goal of Therapy:  Heparin level 0.3-0.7 units/ml Monitor platelets by anticoagulation protocol: Yes   Plan:  03/28 @ 0500 HL 0.47 therapeutic. Will continue current rate and will recheck HL w/ am labs and continue to monitor.   4/27, PharmD, BCPS Clinical Pharmacist 07/15/2019,7:14 AM

## 2019-07-15 NOTE — Progress Notes (Signed)
Progress Note   Subjective   Remains ill,  No chest pain or SOB  Inpatient Medications    Scheduled Meds: . busPIRone  10 mg Oral BID  . Chlorhexidine Gluconate Cloth  6 each Topical Daily  . famotidine  20 mg Oral BID  . insulin aspart  0-20 Units Subcutaneous Q4H  . insulin detemir  22 Units Subcutaneous BID  . levothyroxine  88 mcg Oral QAC breakfast  . mouth rinse  15 mL Mouth Rinse BID  . methylPREDNISolone (SOLU-MEDROL) injection  40 mg Intravenous Daily  . pramipexole  0.125 mg Oral BID  . Ensure Max Protein  11 oz Oral TID BM  . sodium chloride flush  10-40 mL Intracatheter Q12H  . sodium chloride flush  10-40 mL Intracatheter Q12H   Continuous Infusions: . sodium chloride Stopped (07/11/19 1934)  . amiodarone Stopped (07/14/19 0547)  . heparin 1,900 Units/hr (07/15/19 1158)  . vancomycin 750 mg (07/15/19 1329)   PRN Meds: acetaminophen **OR** acetaminophen, ALPRAZolam, HYDROmorphone (DILAUDID) injection, LORazepam, sodium chloride flush, sodium chloride flush   Vital Signs    Vitals:   07/15/19 0500 07/15/19 0600 07/15/19 0700 07/15/19 0800  BP: (!) 94/59 118/76 (!) 141/71 128/66  Pulse: 64 (!) 56 64 (!) 59  Resp: 16 14 18 20   Temp: 98.2 F (36.8 C)     TempSrc: Oral     SpO2: 99% 100% 100% 99%  Weight:      Height:        Intake/Output Summary (Last 24 hours) at 07/15/2019 1542 Last data filed at 07/15/2019 0600 Gross per 24 hour  Intake 336.94 ml  Output 2000 ml  Net -1663.06 ml   Filed Weights   07/07/19 2310  Weight: (!) 158.8 kg    Telemetry    sinus - Personally Reviewed  Physical Exam   GEN- The patient is overweight and ill appearing, alert and oriented x 3 today.   Head- normocephalic, atraumatic Eyes-  Sclera clear, conjunctiva pink Ears- hearing intact Oropharynx- clear Neck- supple, Lungs-  normal work of breathing Heart- Regular rate and rhythm  GI- soft  Extremities- no clubbing, cyanosis, or edema  MS- no significant  deformity or atrophy Skin- no rash or lesion Psych- euthymic mood, full affect Neuro- strength and sensation are intact   Labs    Chemistry Recent Labs  Lab 07/11/19 0440 07/11/19 0440 07/12/19 0345 07/13/19 0406 07/13/19 2326 07/14/19 0505 07/15/19 0451  NA 141   < > 140   < > 141 142 138  K 3.4*   < > 3.0*   < > 3.9 4.3 3.6  CL 111   < > 109   < > 108 111 108  CO2 21*   < > 23   < > 21* 24 25  GLUCOSE 154*   < > 161*   < > 209* 217* 152*  BUN 50*   < > 32*   < > 21* 20 18  CREATININE 1.18*   < > 0.79   < > 0.54 0.60 0.59  CALCIUM 8.3*   < > 8.4*   < > 8.4* 8.5* 8.5*  PROT 6.5  --  6.4*  --  6.7  --   --   ALBUMIN 2.5*  --  2.5*  --  2.3*  --   --   AST 40  --  29  --  18  --   --   ALT 42  --  39  --  29  --   --   ALKPHOS 59  --  62  --  70  --   --   BILITOT 0.8  --  0.9  --  0.7  --   --   GFRNONAA 51*   < > >60   < > >60 >60 >60  GFRAA 59*   < > >60   < > >60 >60 >60  ANIONGAP 9   < > 8   < > 12 7 5    < > = values in this interval not displayed.     Hematology Recent Labs  Lab 07/13/19 0406 07/14/19 0505 07/15/19 0451  WBC 10.4 9.7 11.0*  RBC 3.91 4.07 3.89  HGB 9.3* 9.4* 9.0*  HCT 30.9* 32.5* 30.2*  MCV 79.0* 79.9* 77.6*  MCH 23.8* 23.1* 23.1*  MCHC 30.1 28.9* 29.8*  RDW 17.2* 17.2* 17.0*  PLT 209 233 375   Patient Profile:   [name] [name] is a 59 y.o. female with a hx of hypertension, diabetes type 2, morbid obesity, depression/anxiety, diastolic CHF, December 2020 with ARDS secondary to COVID-19 pneumonia, presenting to the hospital with body ache, shortness of breath, confusion, respiratory distress, somnolence  Assessment & Plan    1.  MRSA bacteremia Dr January 2021 previously discussed TEE with patient.  Given her obesity and respiratory issues, it was felt to be a high risk procedure and was deferred.  The patient continues to have issues with sepsis.  ID is following. I will make NPO after midnight for possible TEE tomorrow.  2. Atrial  fibrillation She had afib documented on EKG 07/13/2019 She remains in sinus since that time Her afib is secondary to her acute medical illness.  chads2vasc score is 4.  Given her acute illness and anemia, I would favor stopping stopping heparin unless more sustained arrhythmias occur. Consider stopping amiodarone tomorrow if no further afib overnight. I would anticipate as her clinical condition improves that her afib will likely resolve Treatment of obesity and OSA long term will help to avoid recurrences.  07/15/2019 MD, [facility] 07/15/2019 3:42 PM

## 2019-07-15 NOTE — Progress Notes (Signed)
Appears c/w ileus on yesterday's CT, and now better with NGT out, and started on CLD.    Surgery has no further recs at present.  Call if further concerns arise.

## 2019-07-15 NOTE — Progress Notes (Signed)
Pharmacy Antibiotic Note  [name] [name] is a 59 y.o. female admitted on 07/07/2019 with sepsis.  Pharmacy has been consulted for Vancomycin dosing. Patient now with MRSA bacteremia. Patient has history of neurogenic bladder with implanted bladder stimulator. Urology stated the infection source is unlikely to be InterStim implant. Ciprofloxacin d/c per CCM. WBC trending down. Tmax 24 hours 98.22F. TTE without evidence of endocarditis. TEE recommended when patient is stable. Repeat Bcx ordered 3/22.   3/26 Vancomycin peak: 31 (drawn 2 hours after end of infusion) 3/26 Vancomycin trough: 27 (drawn 6 hours early)  Plan: Patient switched to: Vancomycin 750 mg IV Q 12 hrs. Goal AUC 400-550. Expected AUC: 456.3 Cssmin: 12.5  Calculated using 2 level kinetics: Peak (03/26 @ 1147 drawn an hour late): 31 mcg/mL, trough (03/26 @ 2015 drawn 45 minutes early): 17 mcg/mL.  Patient specific: T1/2 9.8 hrs Ke 0.0710  Will recheck VP at 03/28 @ 1600 and trough at 03/29 @ 0200 and continue to monitor. Renal function improving.  Height: 5\' 10"  (177.8 cm) Weight: (!) 350 lb (158.8 kg) IBW/kg (Calculated) : 68.5  Temp (24hrs), Avg:97.7 F (36.5 C), Min:96.6 F (35.9 C), Max:98.2 F (36.8 C)  Recent Labs  Lab  0000 07/10/19 1033 07/10/19 2100 07/11/19 0433 07/11/19 0440 07/11/19 0440 07/12/19 0345 07/12/19 1051 07/13/19 0406 07/13/19 1147 07/13/19 1524 07/13/19 2015 07/13/19 2326 07/14/19 0505 07/15/19 0451  WBC   < >  --   --   --  18.3*  --  9.8  --  10.4  --   --   --   --  9.7 11.0*  CREATININE   < >  --   --   --  1.18*   < > 0.79  --  0.66  --   --   --  0.54 0.60 0.59  LATICACIDVEN  --  1.6  --   --   --   --   --   --   --   --   --   --   --   --   --   VANCOTROUGH  --   --   --    < >  --   --   --   --   --   --  27* 17  --   --   --   VANCOPEAK  --   --  44*  --   --   --   --   --   --  31  --   --   --   --   --   VANCORANDOM  --   --   --   --   --   --   --  17  --   --    --   --   --   --   --    < > = values in this interval not displayed.    Estimated Creatinine Clearance: 128.1 mL/min (by C-G formula based on SCr of 0.59 mg/dL).    Allergies  Allergen Reactions  . Cephalexin Hives  . Codeine Palpitations, Nausea Only, Nausea And Vomiting, Rash and Shortness Of Breath    "makes heart fly, she gets flushed and passes out"  . Doxycycline Rash  . Propoxyphene Rash and Shortness Of Breath    Increase heart rate  . Sulfa Antibiotics Palpitations, Nausea Only, Shortness Of Breath and Hives    "makes heart fly, she gets flushed and passes out"  .  Lovenox [Enoxaparin Sodium] Hives  . Hydrocodone Nausea And Vomiting    Hear racing & breaks out into a cold sweat.  . Meropenem Rash    Erythematous, hot, pruritic rash over arms, chest, back, abdomen, and face occurred at the end of meropenem infusion on 02/22/18    Antimicrobials this admission: Aztreonam 3/21 >> 3/21 Ciprofloxacin 3/21>> 3/22 Vancomycin 3/21>>   Dose adjustments this admission: 3/24: Vancomycin 1250 q12h >> vancomycin 1500 q24h 3/25: Vancomycin 1500 q24h >> vancomycin 1000 mg q12h  Microbiology results: 3/22 BCx: NG x 4 days 3/21 BCx: 4/4 MRSA growth 3/21 MRSA PCR: Positive  3/20 UCx: no growth    Thank you for allowing pharmacy to be a part of this patient's care.  Thomasene Ripple, PharmD, BCPS Clinical Pharmacist 07/15/2019 1:09 PM

## 2019-07-16 ENCOUNTER — Inpatient Hospital Stay: Payer: Self-pay

## 2019-07-16 ENCOUNTER — Encounter
Admission: EM | Disposition: A | Discharge status: Home Health | Payer: Self-pay | Source: Home / Self Care | Attending: Internal Medicine

## 2019-07-16 ENCOUNTER — Inpatient Hospital Stay: Payer: Medicaid Other | Admitting: Anesthesiology

## 2019-07-16 ENCOUNTER — Inpatient Hospital Stay (HOSPITAL_COMMUNITY)
Admit: 2019-07-16 | Discharge: 2019-07-16 | Disposition: A | Discharge status: Home / Self Care | Payer: Medicaid Other | Attending: Nurse Practitioner | Admitting: Nurse Practitioner

## 2019-07-16 DIAGNOSIS — Z888 Allergy status to other drugs, medicaments and biological substances status: Secondary | ICD-10-CM

## 2019-07-16 DIAGNOSIS — Z885 Allergy status to narcotic agent status: Secondary | ICD-10-CM

## 2019-07-16 DIAGNOSIS — R7881 Bacteremia: Secondary | ICD-10-CM

## 2019-07-16 DIAGNOSIS — M059 Rheumatoid arthritis with rheumatoid factor, unspecified: Secondary | ICD-10-CM

## 2019-07-16 DIAGNOSIS — M25512 Pain in left shoulder: Secondary | ICD-10-CM

## 2019-07-16 DIAGNOSIS — Z95828 Presence of other vascular implants and grafts: Secondary | ICD-10-CM

## 2019-07-16 DIAGNOSIS — I480 Paroxysmal atrial fibrillation: Secondary | ICD-10-CM

## 2019-07-16 DIAGNOSIS — Z881 Allergy status to other antibiotic agents status: Secondary | ICD-10-CM

## 2019-07-16 DIAGNOSIS — Z8616 Personal history of COVID-19: Secondary | ICD-10-CM

## 2019-07-16 HISTORY — PX: TEE WITHOUT CARDIOVERSION: SHX5443

## 2019-07-16 LAB — CBC WITH DIFFERENTIAL/PLATELET
Abs Immature Granulocytes: 0.09 10*3/uL — ABNORMAL HIGH (ref 0.00–0.07)
Basophils Absolute: 0 10*3/uL (ref 0.0–0.1)
Basophils Relative: 0 %
Eosinophils Absolute: 0 10*3/uL (ref 0.0–0.5)
Eosinophils Relative: 0 %
HCT: 31.6 % — ABNORMAL LOW (ref 36.0–46.0)
Hemoglobin: 9.4 g/dL — ABNORMAL LOW (ref 12.0–15.0)
Immature Granulocytes: 1 %
Lymphocytes Relative: 16 %
Lymphs Abs: 1.6 10*3/uL (ref 0.7–4.0)
MCH: 23.3 pg — ABNORMAL LOW (ref 26.0–34.0)
MCHC: 29.7 g/dL — ABNORMAL LOW (ref 30.0–36.0)
MCV: 78.2 fL — ABNORMAL LOW (ref 80.0–100.0)
Monocytes Absolute: 0.7 10*3/uL (ref 0.1–1.0)
Monocytes Relative: 7 %
Neutro Abs: 7.7 10*3/uL (ref 1.7–7.7)
Neutrophils Relative %: 76 %
Platelets: 414 10*3/uL — ABNORMAL HIGH (ref 150–400)
RBC: 4.04 MIL/uL (ref 3.87–5.11)
RDW: 16.8 % — ABNORMAL HIGH (ref 11.5–15.5)
WBC: 10.1 10*3/uL (ref 4.0–10.5)
nRBC: 0 % (ref 0.0–0.2)

## 2019-07-16 LAB — GLUCOSE, CAPILLARY
Glucose-Capillary: 144 mg/dL — ABNORMAL HIGH (ref 70–99)
Glucose-Capillary: 70 mg/dL (ref 70–99)
Glucose-Capillary: 123 mg/dL — ABNORMAL HIGH (ref 70–99)
Glucose-Capillary: 261 mg/dL — ABNORMAL HIGH (ref 70–99)
Glucose-Capillary: 183 mg/dL — ABNORMAL HIGH (ref 70–99)

## 2019-07-16 LAB — VANCOMYCIN, TROUGH: Vancomycin Tr: 14 ug/mL — ABNORMAL LOW (ref 15–20)

## 2019-07-16 LAB — HEPARIN LEVEL (UNFRACTIONATED): Heparin Unfractionated: 0.51 IU/mL (ref 0.30–0.70)

## 2019-07-16 LAB — PHOSPHORUS: Phosphorus: 3.3 mg/dL (ref 2.5–4.6)

## 2019-07-16 LAB — MAGNESIUM: Magnesium: 1.9 mg/dL (ref 1.7–2.4)

## 2019-07-16 SURGERY — ECHOCARDIOGRAM, TRANSESOPHAGEAL
Anesthesia: General

## 2019-07-16 MED ORDER — BUTAMBEN-TETRACAINE-BENZOCAINE 2-2-14 % EX AERO
INHALATION_SPRAY | CUTANEOUS | Status: AC
  Filled 2019-07-16: qty 5

## 2019-07-16 MED ORDER — PROPOFOL 10 MG/ML IV BOLUS
INTRAVENOUS | Status: DC | PRN
  Administered 2019-07-16 (×3): 20 mg via INTRAVENOUS
  Administered 2019-07-16: 30 mg via INTRAVENOUS

## 2019-07-16 MED ORDER — PHENYLEPHRINE HCL (PRESSORS) 10 MG/ML IV SOLN
INTRAVENOUS | Status: AC
  Filled 2019-07-16: qty 1

## 2019-07-16 MED ORDER — SODIUM CHLORIDE 0.9 % IV SOLN: INTRAVENOUS | Status: DC

## 2019-07-16 MED ORDER — DEXMEDETOMIDINE HCL IN NACL 80 MCG/20ML IV SOLN
INTRAVENOUS | Status: AC
  Filled 2019-07-16: qty 20

## 2019-07-16 MED ORDER — MIDAZOLAM HCL 2 MG/2ML IJ SOLN
INTRAMUSCULAR | Status: DC | PRN
  Administered 2019-07-16: 2 mg via INTRAVENOUS

## 2019-07-16 MED ORDER — KETAMINE HCL 10 MG/ML IJ SOLN
INTRAMUSCULAR | Status: DC | PRN
  Administered 2019-07-16: 10 mg via INTRAVENOUS
  Administered 2019-07-16: 30 mg via INTRAVENOUS
  Administered 2019-07-16: 10 mg via INTRAVENOUS

## 2019-07-16 MED ORDER — KETAMINE HCL 50 MG/ML IJ SOLN
INTRAMUSCULAR | Status: AC
  Filled 2019-07-16: qty 10

## 2019-07-16 MED ORDER — APIXABAN 5 MG PO TABS: 5.0000 mg | ORAL_TABLET | Freq: Two times a day (BID) | ORAL | Status: DC

## 2019-07-16 MED ORDER — DEXMEDETOMIDINE HCL 200 MCG/2ML IV SOLN
INTRAVENOUS | Status: DC | PRN
  Administered 2019-07-16: 20 ug via INTRAVENOUS

## 2019-07-16 MED ORDER — LIDOCAINE HCL (PF) 2 % IJ SOLN
INTRAMUSCULAR | Status: AC
  Filled 2019-07-16: qty 5

## 2019-07-16 MED ORDER — SODIUM CHLORIDE (PF) 0.9 % IJ SOLN
INTRAMUSCULAR | Status: AC
  Filled 2019-07-16: qty 10

## 2019-07-16 MED ORDER — MIDAZOLAM HCL 2 MG/2ML IJ SOLN
INTRAMUSCULAR | Status: AC
  Filled 2019-07-16: qty 2

## 2019-07-16 MED ORDER — GLYCOPYRROLATE 0.2 MG/ML IJ SOLN
INTRAMUSCULAR | Status: DC | PRN
  Administered 2019-07-16: .2 mg via INTRAVENOUS

## 2019-07-16 MED ORDER — VANCOMYCIN HCL 1750 MG/350ML IV SOLN
1750.0000 mg | INTRAVENOUS | Status: DC
  Administered 2019-07-16 – 2019-07-17 (×2): 1750 mg via INTRAVENOUS
  Filled 2019-07-16 (×3): qty 350

## 2019-07-16 MED ORDER — LIDOCAINE VISCOUS HCL 2 % MT SOLN
OROMUCOSAL | Status: AC
  Administered 2019-07-16: 10 mL
  Filled 2019-07-16: qty 15

## 2019-07-16 MED ORDER — APIXABAN 5 MG PO TABS
10.0000 mg | ORAL_TABLET | Freq: Two times a day (BID) | ORAL | Status: DC
  Administered 2019-07-16 – 2019-07-17 (×3): 10 mg via ORAL
  Filled 2019-07-16 (×3): qty 2

## 2019-07-16 MED ORDER — EPHEDRINE 5 MG/ML INJ
INTRAVENOUS | Status: AC
  Filled 2019-07-16: qty 10

## 2019-07-16 MED ORDER — GLYCOPYRROLATE 0.2 MG/ML IJ SOLN
INTRAMUSCULAR | Status: AC
  Filled 2019-07-16: qty 1

## 2019-07-16 NOTE — Progress Notes (Signed)
ANTICOAGULATION CONSULT NOTE  Pharmacy Consult for apixaban Indication: splenic infarct   Patient Measurements: Height: 5\' 10"  (177.8 cm) Weight: (!) 350 lb (158.8 kg) IBW/kg (Calculated) : 68.5 HEPARIN DW (KG): 107.6  Vital Signs: Temp: 98.6 F (37 C) (03/29 0712) Temp Source: Oral (03/29 0712) BP: 105/66 (03/29 0900) Pulse Rate: 78 (03/29 0900)  Labs: Recent Labs    07/13/19 2326 07/14/19 0258 07/14/19 0505 07/14/19 0505 07/14/19 1023 07/15/19 0005 07/15/19 0451 07/16/19 0636  HGB  --   --  9.4*   < >  --   --  9.0* 9.4*  HCT  --   --  32.5*  --   --   --  30.2* 31.6*  PLT  --   --  233  --   --   --  375 414*  HEPARINUNFRC  --    < >  --   --    < > 0.50 0.47 0.51  CREATININE 0.54  --  0.60  --   --   --  0.59  --   TROPONINIHS  --   --  17  --   --   --   --   --    < > = values in this interval not displayed.    Estimated Creatinine Clearance: 128.1 mL/min (by C-G formula based on SCr of 0.59 mg/dL).   Medical History: Past Medical History:  Diagnosis Date  . Abdominal wall hernia 01/29/2013  . Anxiety   . Arthritis    Rheumatoid  . C. difficile colitis   . Chronic diastolic heart failure (HCC)   . COVID-19 03/23/2019   Diagnosed at [facility] (send-out) on 03/23/2019  . Depression   . Diabetes mellitus    states no meds or diet restrictions  at present  . Diastolic CHF (HCC)   . Esophagitis   . Fluid retention   . GERD (gastroesophageal reflux disease)   . Hiatal hernia   . Hypertension   . Hypokalemia due to loss of potassium 10/21/2015   Overview:  Associated with 3 weeks of diarrhea  And QT prolongation.  . Hypothyroidism   . IBS (irritable bowel syndrome)   . Moderate episode of recurrent major depressive disorder (HCC) 06/03/2004  . Morbid obesity (HCC)   . MRSA (methicillin resistant Staphylococcus aureus) infection 11/2017   left inner thigh abcess  . Neurogenic bladder    has pacemaker  . Neuropathy   . Obesity   . Panic attacks   .  Pneumonia due to COVID-19 virus   . Rheumatoid arthritis (HCC)   . Sleep apnea    STATES SEVERE, CANT TOLERATE MASK- LAST STUDY YEARS AGO    Medications:  Medications Prior to Admission  Medication Sig Dispense Refill Last Dose  . ALPRAZolam (XANAX) 0.5 MG tablet Take 1 tablet (0.5 mg total) by mouth 2 (two) times daily as needed for anxiety. 10 tablet 0 Unknown at PRN  . ARIPiprazole (ABILIFY) 5 MG tablet TAKE 1 TABLET BY MOUTH ONCE DAILY. (Patient taking differently: Take 5 mg by mouth daily. ) 28 tablet 11 24+ hours at Unknown  . atorvastatin (LIPITOR) 80 MG tablet Take 80 mg by mouth daily at 6 PM.    72+ hours at Unknown  . benzonatate (TESSALON PERLES) 100 MG capsule Take 1 capsule (100 mg total) by mouth 3 (three) times daily as needed for cough. 30 capsule 0 Unknown at PRN  . budesonide (PULMICORT) 0.5 MG/2ML nebulizer solution Take 2 mLs (0.5  mg total) by nebulization 2 (two) times daily. 120 mL 11 72+ hours at Unknown  . busPIRone (BUSPAR) 10 MG tablet TAKE 1 TABLET BY MOUTH TWICE DAILY (Patient taking differently: Take 10 mg by mouth 2 (two) times daily. ) 56 tablet 11 72+ hours at Unknown  . Cholecalciferol (VITAMIN D3) 125 MCG (5000 UT) CAPS Take 1 capsule (5,000 Units total) by mouth daily with breakfast. Take along with calcium and magnesium. 90 capsule 3 72+ hours at Unknown  . Ensure Max Protein (ENSURE MAX PROTEIN) LIQD Take 330 mLs (11 oz total) by mouth 2 (two) times daily between meals. 10000 mL 0   . famotidine (PEPCID) 20 MG tablet Take 1 tablet (20 mg total) by mouth daily. 30 tablet 0 72+ hours at Unknown  . FLUoxetine (PROZAC) 20 MG capsule Take 1 capsule (20 mg total) by mouth daily. TAKE (1) CAPSULE BY MOUTH ONCE DAILY. (Patient taking differently: Take 20 mg by mouth daily. ) 30 capsule 6 72+ hours at Unknown  . folic acid (FOLVITE) 1 MG tablet Take 1 mg by mouth daily.    72+ hours at Unknown  . furosemide (LASIX) 20 MG tablet Take 1 tablet (20 mg total) by mouth  daily as needed for fluid or edema (weight gain >3 lbs in 1 day or >5 lbs in 2 days). 30 tablet 0 Unknown at PRN  . GNP CALCIUM CITRATE+D MAXIMUM 315-250 MG-UNIT TABS Take 1 tablet by mouth daily.   72+ hours at Unknown  . Insulin Degludec (TRESIBA FLEXTOUCH) 200 UNIT/ML SOPN Inject 65-100 Units into the skin daily.   72+ hours at Unknown  . insulin lispro (HUMALOG) 100 UNIT/ML injection Inject 20 Units into the skin 3 (three) times daily before meals.     Marland Kitchen levothyroxine (SYNTHROID, LEVOTHROID) 88 MCG tablet Take 88 mcg by mouth daily before breakfast.   72+ hours at Unknown  . linagliptin (TRADJENTA) 5 MG TABS tablet Take 1 tablet (5 mg total) by mouth daily. 30 tablet 0 72+ hours at Unknown  . methotrexate 2.5 MG tablet Take 10 mg by mouth every Wednesday.    Past Week at Unknown time  . polyethylene glycol (MIRALAX / GLYCOLAX) 17 g packet Take 17 g by mouth daily as needed for mild constipation. 14 each 0 Unknown at PRN  . pramipexole (MIRAPEX) 0.125 MG tablet Take 0.25 mg by mouth at bedtime.    72+ hours at Unknown  . pregabalin (LYRICA) 150 MG capsule Take 1 capsule (150 mg total) by mouth 3 (three) times daily. 90 capsule 5 72+ hours at Unknown  . QUEtiapine (SEROQUEL) 300 MG tablet TAKE ONE TABLET BY MOUTH AT BEDTIME. (Patient taking differently: Take 300 mg by mouth at bedtime. TAKE ONE TABLET BY MOUTH AT BEDTIME.) 28 tablet 11 72+ hours at Unknown  . VESICARE 5 MG tablet Take 5 mg by mouth daily.   72+ hours at Unknown  . zinc sulfate 220 (50 Zn) MG capsule Take 1 capsule (220 mg total) by mouth daily. 30 capsule 0 72+ hours at Unknown  . zolpidem (AMBIEN) 5 MG tablet Take 1 tablet (5 mg total) by mouth at bedtime. TAKE 1 TABLET BY MOUTH EVERYDAY AT BEDTIME (Patient taking differently: Take 5 mg by mouth at bedtime. ) 30 tablet 5 72+ hours at Unknown  . ascorbic acid (VITAMIN C) 500 MG tablet Take 1 tablet (500 mg total) by mouth daily. (Patient not taking: Reported on 07/11/2019) 30 tablet 0  Not Taking at Unknown time  .  ciclesonide (ALVESCO) 160 MCG/ACT inhaler Inhale 4 puffs into the lungs 2 (two) times daily. (Patient not taking: Reported on 07/11/2019) 1 Inhaler 0 Not Taking at Unknown time  . dexamethasone (DECADRON) 1 MG tablet Take 2 mg daily for 3days,Take 1mg  daily for 3 days, Take 1mg  every other day for 3days,then stop (Patient not taking: Reported on 07/11/2019) 12 tablet 0 Not Taking at Unknown time  . ipratropium-albuterol (DUONEB) 0.5-2.5 (3) MG/3ML SOLN Take 3 mLs by nebulization 3 (three) times daily. (Patient not taking: Reported on 07/11/2019) 360 mL 0 Not Taking at Unknown time  . oxyCODONE (OXY IR/ROXICODONE) 5 MG immediate release tablet Take 1 tablet (5 mg total) by mouth every 6 (six) hours as needed for severe pain. Must last 30 days 120 tablet 0     Assessment: Patient is a 59 y/o F with a medical history including hypertension, type-2 diabetes, hypothyroidism, rheumatoid arthritis, diastolic heart failure, morbid obesity, hx of hospitalization in 03/2019 for ARDS secondary to COVID-19 disease who was admitted with septic shock secondary to MRSA bacteremia. Prior imaging concerning for possible splenic infarcts. Pharmacy was consulted to initiate heparin drip for splenic infarct and is now being changed over to apixaban  Goal of Therapy:  Monitor platelets by anticoagulation protocol: Yes   Plan:   Start apixaban 10 mg twice daily for 7 days followed by 5 mg twice daily  CBC at least every 3 days per protocol   Vallery Sa, PharmD, BCPS Clinical Pharmacist 07/16/2019,12:58 PM

## 2019-07-16 NOTE — Procedures (Signed)
Transesophageal Echocardiogram :  Indication: bacteremia  Procedure: 9cc of viscous lidocaine were given orally to provide local anesthesia to the oropharynx. The patient was positioned supine on the left side, bite block provided. The patient was sedated per anesthesia team.  Using digital technique an omniplane probe was advanced into the esophagus without incident.    sedation: Per anesthesia team  See report in EPIC  for complete details: In brief, imaging revealed normal LV function with no RWMAs and no mural apical thrombus. Estimated ejection fraction was 60%. There was no evidence for endocarditis  Imaging of the septum showed no ASD or VSD 2D and color flow confirmed no PFO  The LA was well visualized in orthogonal views.  There was no spontaneous contrast and no thrombus in the LA and LA appendage   The descending thoracic aorta had no  mural aortic debris with no evidence of aneurysmal dilation or disection   Debbe Odea 07/16/2019 8:34 AM

## 2019-07-16 NOTE — Progress Notes (Signed)
PT Cancellation Note  Patient Details Name: [name] [name] MRN: [id number] DOB: 05/24/1960   Cancelled Treatment:    Reason Eval/Treat Not Completed: Other (comment). Pt currently performing mobility efforts with OT and reports fatigue. Also noted hypotension with upright posture. Will re-attempt next date.   Colandra Ohanian 07/16/2019, 2:05 PM  Elizabeth Palau, PT, DPT [phone]

## 2019-07-16 NOTE — Anesthesia Preprocedure Evaluation (Signed)
Anesthesia Evaluation  Patient identified by MRN, date of birth, ID band Patient awake    Reviewed: Allergy & Precautions, NPO status , Patient's Chart, lab work & pertinent test results  History of Anesthesia Complications Negative for: history of anesthetic complications  Airway Mallampati: III  TM Distance: >3 FB Neck ROM: Full    Dental no notable dental hx. (+) Edentulous Upper, Edentulous Lower   Pulmonary sleep apnea , pneumonia, resolved, COPD, Patient abstained from smoking.Not current smoker, former smoker,  covid pneumonia , now on 2L O2 at baseline OSA - no CPAP because patient is claustrophobic   Pulmonary exam normal breath sounds clear to auscultation       Cardiovascular Exercise Tolerance: Good METShypertension, Pt. on medications +CHF  (-) CAD and (-) Past MI (-) dysrhythmias  Rhythm:Regular Rate:Normal - Systolic murmurs    Neuro/Psych PSYCHIATRIC DISORDERS Anxiety Depression Bipolar Disorder negative neurological ROS     GI/Hepatic GERD  Controlled,(+)     (-) substance abuse  ,   Endo/Other  diabetesHypothyroidism   Renal/GU negative Renal ROS     Musculoskeletal  (+) Arthritis , Rheumatoid disorders,    Abdominal (+) + obese,   Peds  Hematology   Anesthesia Other Findings Past Medical History: 01/29/2013: Abdominal wall hernia No date: Anxiety No date: Arthritis     Comment:  Rheumatoid No date: C. difficile colitis No date: Chronic diastolic heart failure (HCC) 03/23/2019: COVID-19     Comment:  Diagnosed at [facility] (send-out) on 03/23/2019 No date: Depression No date: Diabetes mellitus     Comment:  states no meds or diet restrictions  at present No date: Diastolic CHF (HCC) No date: Esophagitis No date: Fluid retention No date: GERD (gastroesophageal reflux disease) No date: Hiatal hernia No date: Hypertension 10/21/2015: Hypokalemia due to loss of potassium     Comment:   Overview:  Associated with 3 weeks of diarrhea  And QT               prolongation. No date: Hypothyroidism No date: IBS (irritable bowel syndrome) 06/03/2004: Moderate episode of recurrent major depressive disorder  (HCC) No date: Morbid obesity (HCC) 11/2017: MRSA (methicillin resistant Staphylococcus aureus) infection     Comment:  left inner thigh abcess No date: Neurogenic bladder     Comment:  has pacemaker No date: Neuropathy No date: Obesity No date: Panic attacks No date: Pneumonia due to COVID-19 virus No date: Rheumatoid arthritis (HCC) No date: Sleep apnea     Comment:  STATES SEVERE, CANT TOLERATE MASK- LAST STUDY YEARS AGO  Reproductive/Obstetrics                             Anesthesia Physical Anesthesia Plan  ASA: III  Anesthesia Plan: General   Post-op Pain Management:    Induction: Intravenous  PONV Risk Score and Plan: 3 and Ondansetron, Propofol infusion and TIVA  Airway Management Planned: Nasal Cannula  Additional Equipment: None  Intra-op Plan:   Post-operative Plan:   Informed Consent: I have reviewed the patients History and Physical, chart, labs and discussed the procedure including the risks, benefits and alternatives for the proposed anesthesia with the patient or authorized representative who has indicated his/her understanding and acceptance.     Dental advisory given  Plan Discussed with: CRNA and Surgeon  Anesthesia Plan Comments: (Discussed risks of anesthesia with patient, including possibility of difficulty with spontaneous ventilation under anesthesia necessitating airway intervention, PONV, and rare  risks such as cardiac or respiratory or neurological events. Patient understands. Patient informed about increased incidence of above perioperative risk due to high BMI. Patient understands.  Pt denies any nausea yesterday or today (had xray evidence of ileus before but  Was able to tolerate PO and was having  bowel movements). )        Anesthesia Quick Evaluation

## 2019-07-16 NOTE — Progress Notes (Signed)
Pharmacy Antibiotic Note  [name] [name] is a 59 y.o. female admitted on 07/07/2019 with sepsis.  Pharmacy has been consulted for Vancomycin dosing. Patient now with MRSA bacteremia. Patient has history of neurogenic bladder with implanted bladder stimulator. Urology stated the infection source is unlikely to be InterStim implant. Ciprofloxacin d/c per CCM. WBC trending down. Tmax 24 hours 98.60F. TTE without evidence of endocarditis. TEE recommended when patient is stable. Repeat Bcx ordered 3/22.    Plan: 03/29 @ 0100: VP 19 (03/28 @ 1619); VT 14 (03/29 @ 0050).  Patient specific based on 2-level kinetics: T1/2 19.3 hrs Ke 0.0359  Will switch patient to: Vancomycin 1750 mg IV Q 24 hrs. Goal AUC 400-550. Expected AUC: 469.9 Based on above levels. Cssmax: 28.2 mcg/mL Cssmin: 12.8 mcg/mL  Will switch patient to above regimen for more favored AUC and kinetics as well as ease of administration. Will recheck a vanc peak and trough on 04/01 @ 0600 (an hour post a 2-hour infusion) and 04/02 @ 0200 prior to 5th dose, respectively and will continue to monitor renal function and adjust as needed.  Height: 5\' 10"  (177.8 cm) Weight: (!) 350 lb (158.8 kg) IBW/kg (Calculated) : 68.5  Temp (24hrs), Avg:98.1 F (36.7 C), Min:97.8 F (36.6 C), Max:98.2 F (36.8 C)  Recent Labs  Lab 07/10/19 1033 07/10/19 2100 07/11/19 0440 07/11/19 0440 07/12/19 0345 07/12/19 1051 07/13/19 0406 07/13/19 1147 07/13/19 1524 07/13/19 2015 07/13/19 2326 07/14/19 0505 07/15/19 0451 07/15/19 1619 07/16/19 0050  WBC  --   --  18.3*  --  9.8  --  10.4  --   --   --   --  9.7 11.0*  --   --   CREATININE  --   --  1.18*   < > 0.79  --  0.66  --   --   --  0.54 0.60 0.59  --   --   LATICACIDVEN 1.6  --   --   --   --   --   --   --   --   --   --   --   --   --   --   VANCOTROUGH  --    < >  --   --   --   --   --   --    < > 17  --   --   --   --  14*  VANCOPEAK  --    < >  --   --   --   --   --  31  --   --    --   --   --  19*  --   VANCORANDOM  --   --   --   --   --  17  --   --   --   --   --   --   --   --   --    < > = values in this interval not displayed.    Estimated Creatinine Clearance: 128.1 mL/min (by C-G formula based on SCr of 0.59 mg/dL).    Allergies  Allergen Reactions  . Cephalexin Hives  . Codeine Palpitations, Nausea Only, Nausea And Vomiting, Rash and Shortness Of Breath    "makes heart fly, she gets flushed and passes out"  . Doxycycline Rash  . Propoxyphene Rash and Shortness Of Breath    Increase heart rate  . Sulfa Antibiotics Palpitations, Nausea Only, Shortness  Of Breath and Hives    "makes heart fly, she gets flushed and passes out"  . Lovenox [Enoxaparin Sodium] Hives  . Hydrocodone Nausea And Vomiting    Hear racing & breaks out into a cold sweat.  . Meropenem Rash    Erythematous, hot, pruritic rash over arms, chest, back, abdomen, and face occurred at the end of meropenem infusion on 02/22/18    Antimicrobials this admission: Aztreonam 3/21 >> 3/21 Ciprofloxacin 3/21>> 3/22 Vancomycin 3/21>>   Dose adjustments this admission: 3/24: Vancomycin 1250 q12h >> vancomycin 1500 q24h 3/25: Vancomycin 1500 q24h >> vancomycin 1000 mg q12h  Microbiology results: 3/22 BCx: NG x 4 days 3/21 BCx: 4/4 MRSA growth 3/21 MRSA PCR: Positive  3/20 UCx: no growth    Thank you for allowing pharmacy to be a part of this patient's care.  Tobie Lords, PharmD, BCPS Clinical Pharmacist 07/16/2019 3:39 AM

## 2019-07-16 NOTE — Anesthesia Postprocedure Evaluation (Signed)
Anesthesia Post Note  Patient: GISEL VIPOND  Procedure(s) Performed: TRANSESOPHAGEAL ECHOCARDIOGRAM (TEE) (N/A )  Patient location during evaluation: Specials Recovery Anesthesia Type: General Level of consciousness: awake and alert Pain management: pain level controlled Vital Signs Assessment: post-procedure vital signs reviewed and stable Respiratory status: spontaneous breathing, nonlabored ventilation, respiratory function stable and patient connected to nasal cannula oxygen Cardiovascular status: blood pressure returned to baseline and stable Postop Assessment: no apparent nausea or vomiting Anesthetic complications: no     Last Vitals:  Vitals:   07/16/19 0832 07/16/19 0833  BP:    Pulse: 65 68  Resp: 17 13  Temp:    SpO2: 100% 100%    Last Pain:  Vitals:   07/16/19 0712  TempSrc: Oral  PainSc: 6                  Corinda Gubler

## 2019-07-16 NOTE — Progress Notes (Signed)
Good day. Ate off and on.  Napped very little. Up to [facility]  with PT. Home health nurse in to teach patient about home antibiotics.

## 2019-07-16 NOTE — Treatment Plan (Signed)
Diagnosis: MRSA bacteremia with splenic infarcts Baseline Creatinine  < 1   Allergies  Allergen Reactions  . Cephalexin Hives  . Codeine Palpitations, Nausea Only, Nausea And Vomiting, Rash and Shortness Of Breath    "makes heart fly, she gets flushed and passes out"  . Doxycycline Rash  . Propoxyphene Rash and Shortness Of Breath    Increase heart rate  . Sulfa Antibiotics Palpitations, Nausea Only, Shortness Of Breath and Hives    "makes heart fly, she gets flushed and passes out"  . Lovenox [Enoxaparin Sodium] Hives  . Hydrocodone Nausea And Vomiting    Hear racing & breaks out into a cold sweat.  . Meropenem Rash    Erythematous, hot, pruritic rash over arms, chest, back, abdomen, and face occurred at the end of meropenem infusion on 02/22/18    OPAT Orders Vancomycin 1750mg  every 24 hours Per pharmacy protocol Aim for Vancomycin trough 15-20  Duration: 6 weeks End Date: 08/20/19  [facility] Care Per Protocol:  Labs weekly Monday while on IV antibiotics: _X_ CBC with differential _X_CMP _X__ Vancomycin trough  Labs every Thursday while on antibiotics X BMP X Vancomycin trough  _X_ Please pull PIC at completion of IV antibiotics _ Fax weekly labs to Dr.Lonisha Bobby [phone]  Clinic Follow Up Appt:4 weeks   Call [phone] to make appt

## 2019-07-16 NOTE — Progress Notes (Signed)
PHARMACY CONSULT NOTE  Pharmacy Consult for Electrolyte Monitoring and Replacement   Recent Labs: Potassium (mmol/L)  Date Value  07/15/2019 3.6  05/27/2014 3.8   Magnesium (mg/dL)  Date Value  74/71/5953 1.9  12/02/2013 1.1 (L)   Calcium (mg/dL)  Date Value  96/72/8979 8.5 (L)   Calcium, Total (mg/dL)  Date Value  15/07/1362 8.5   Albumin (g/dL)  Date Value  38/37/7939 2.3 (L)  11/16/2018 4.2  12/03/2013 2.7 (L)   Phosphorus (mg/dL)  Date Value  68/86/4847 3.3   Sodium (mmol/L)  Date Value  07/15/2019 138  11/16/2018 134  05/27/2014 136   Corrected Ca: 9.86 mg/dL  Assessment: 59 year old female admitted with difficulty breathing. Patient recently hospitalized 04/06/19 to 05/04/19 post-COVID. Subsequently MRSA bacteremia was discovered and she is being treated with IV vancomycin. Pharmacy was consulted for management of electrolytes and glucose control.  Goal of Therapy:  Electrolytes WNL Glucose < 180 (ideally)  Plan:  Electrolytes:   No replacement needed at this time.   Follow-up AM labs tomorrow   Glucose (3/21 Hgb A1C 11.6%):   Blood glucoses: 119 - 194   Stress dose steroids d/c 3/25, now on methylprednisolone 40 mg IV daily (day 4)  Continue levemir at 22 units BID   Continue SS  On Tresiba and linagliptin PTA   Lowella Bandy, PharmD, BCPS 07/16/2019 7:13 AM

## 2019-07-16 NOTE — Progress Notes (Signed)
Occupational Therapy Treatment Patient Details Name: [name] [name] MRN: [id number] DOB: Jun 01, 1960 Today's Date: 07/16/2019    History of present illness Pt admitted for septic shock secondary to MRSA bacteremia. History includes covid 59 with ARDS in Dec 2020 and since has been on 2L of O2 at baseline. Other PMH includes RA   OT comments  [name] [name] was seen for OT treatment on this date. Upon arrival to room pt was reporting fatigue from not sleeping well and a procedure this AM, but pt agreeable to tx stating she "wants to try her best." Pt required MOD A sup>sit c HOB elevated, SpO2 stats dropping to 90% on 2L [place], resolved c PLB and seated rest. MOD A x2 + RW  SPT to [facility] for toilet transfer training. Pt c/o lightheadedness after t/f to commode and noted to be hypotensive. Pt reported desire to practice further transfers but 2/2 hypotension pt returned to bed c MOD A x2 sit>sup. Pt agreeable to attempt OOB to recliner tomorrow c NSG or rehab team. Pt stating goal to return home c HHOT over recommendation of SNF. Pt making good progress toward goals. Pt continues to benefit from skilled OT services to maximize return to PLOF and minimize risk of future falls, injury, caregiver burden, and readmission. Will continue to follow POC. Discharge recommendation remains appropriate.    Follow Up Recommendations  SNF    Equipment Recommendations       Recommendations for Other Services      Precautions / Restrictions Precautions Precautions: Fall Restrictions Weight Bearing Restrictions: No       Mobility Bed Mobility Overal bed mobility: Needs Assistance Bed Mobility: Supine to Sit;Sit to Supine     Supine to sit: Mod assist;HOB elevated Sit to supine: Mod assist;+2 for physical assistance   General bed mobility comments: SpO2 desat to 91% on 2L [place] sup>sit, resolved c rest and PLB. SpO2 stable ~95% t/o OOB mobility  Transfers Overall transfer level: Needs assistance Equipment used:  Rolling walker (2 wheeled) Transfers: Sit to/from [facility] Sit to Stand: Mod assist;+2 physical assistance Stand pivot transfers: Mod assist;+2 physical assistance       General transfer comment: Pt reports light headedness after SPT to BSC: BP 89/78, MAP 89. Pt reports feeling tired but wants to attempt multiple sit<>stand trials from the bed. Further trials terminated 2/2 + orthostatics and pt returned to bed. Pt agreeable to attempt up to recliner tomorrow.     Balance Overall balance assessment: Needs assistance Sitting-balance support: Bilateral upper extremity supported;Feet supported Sitting balance-Leahy Scale: Fair     Standing balance support: Bilateral upper extremity supported Standing balance-Leahy Scale: Fair                             ADL either performed or assessed with clinical judgement   ADL Overall ADL's : Needs assistance/impaired                                       General ADL Comments: MAX A don/doff B socks at bed level. MOD A x2 toilet transfer bed<>BSC     Vision       Perception     Praxis      Cognition Arousal/Alertness: Awake/alert Behavior During Therapy: WFL for tasks assessed/performed Overall Cognitive Status: Within Functional Limits for tasks assessed  General Comments: Pt reports not sleeping well and having procedure this AM        Exercises Other Exercises Other Exercises: Pt educated re: falls prevention, energy conservation strategies, DME recommendations, importance of OOB for functional strengthening/endurance Other Exercises: Sup<>sit, toilet t/f to BSC, PLB, don/doff B socks   Shoulder Instructions       General Comments      Pertinent Vitals/ Pain       Pain Assessment: No/denies pain Faces Pain Scale: Hurts a little bit Pain Descriptors / Indicators: Aching;Grimacing Pain Intervention(s): Repositioned  Home Living                                           Prior Functioning/Environment              Frequency  Min 2X/week        Progress Toward Goals  OT Goals(current goals can now be found in the care plan section)  Progress towards OT goals: Progressing toward goals  Acute Rehab OT Goals Patient Stated Goal: to be able to walk again OT Goal Formulation: With patient Time For Goal Achievement: 07/29/19 Potential to Achieve Goals: Good ADL Goals Pt Will Perform Upper Body Dressing: with set-up;sitting Pt Will Transfer to Toilet: with +2 assist;bedside commode(c LRAD PRN) Pt/caregiver will Perform Home Exercise Program: Increased ROM;Both right and left upper extremity  Plan Discharge plan remains appropriate;Frequency remains appropriate    Co-evaluation                 AM-PAC OT "6 Clicks" Daily Activity     Outcome Measure   Help from another person eating meals?: A Little Help from another person taking care of personal grooming?: A Little Help from another person toileting, which includes using toliet, bedpan, or urinal?: A Lot Help from another person bathing (including washing, rinsing, drying)?: A Lot Help from another person to put on and taking off regular upper body clothing?: A Little Help from another person to put on and taking off regular lower body clothing?: A Lot 6 Click Score: 15    End of Session Equipment Utilized During Treatment: Gait belt;Rolling walker;Oxygen(2L [place])  OT Visit Diagnosis: Muscle weakness (generalized) (M62.81);Other abnormalities of gait and mobility (R26.89)   Activity Tolerance Patient tolerated treatment well   Patient Left in bed;with call bell/phone within reach;with bed alarm set   Nurse Communication Mobility status;Other (comment)(Pt agreeable to OOB to recliner tomorrow; +2 to [facility])        Time: 7353-2992 OT Time Calculation (min): 27 min  Charges: OT General Charges $OT Visit: 1 Visit OT  Treatments $Self Care/Home Management : 23-37 mins  [name] [name], M.S. OTR/L  07/16/19, 3:16 PM

## 2019-07-16 NOTE — Progress Notes (Signed)
Patient ID: [name] [name], female   DOB: Jul 03, 1960, 59 y.o.   MRN: [id number] [facility] PROGRESS NOTE  [name] [name] BJY:[id number] DOB: 04/26/1960 DOA: 07/07/2019 PCP: [facility]  HPI/Subjective: Patient feeling better today.  Seen this morning and she was asking for solid food.  Had TEE this morning which showed no vegetations.  Objective: Vitals:   07/16/19 0845 07/16/19 0900  BP: 109/77 105/66  Pulse: 92 78  Resp: (!) 21 19  Temp:    SpO2: 93% 95%    Intake/Output Summary (Last 24 hours) at 07/16/2019 1259 Last data filed at 07/16/2019 0830 Gross per 24 hour  Intake 1077.33 ml  Output 1100 ml  Net -22.67 ml   Filed Weights   07/07/19 2310  Weight: (!) 158.8 kg    ROS: Review of Systems  Constitutional: Negative for chills and fever.  Eyes: Negative for blurred vision.  Respiratory: Positive for shortness of breath. Negative for cough.   Cardiovascular: Negative for chest pain.  Gastrointestinal: Negative for abdominal pain, constipation, diarrhea, nausea and vomiting.  Genitourinary: Negative for dysuria.  Musculoskeletal: Negative for joint pain.  Neurological: Negative for dizziness, focal weakness and headaches.   Exam: Physical Exam  Constitutional: She is oriented to person, place, and time.  HENT:  Nose: No mucosal edema.  Mouth/Throat: No oropharyngeal exudate or posterior oropharyngeal edema.  Eyes: Pupils are equal, round, and reactive to light. Conjunctivae, EOM and lids are normal.  Neck: No JVD present. Carotid bruit is not present. No thyroid mass and no thyromegaly present.  Cardiovascular: S1 normal and S2 normal. Exam reveals no gallop.  No murmur heard. Pulses:      Dorsalis pedis pulses are 2+ on the right side and 2+ on the left side.  Respiratory: No respiratory distress. She has decreased breath sounds in the right lower field and the left lower field. She has no wheezes. She has no rhonchi. She has no rales.  GI: Soft.  Bowel sounds are normal. There is no abdominal tenderness.  Musculoskeletal:     Cervical back: No edema.     Right ankle: No swelling.     Left ankle: No swelling.  Lymphadenopathy:    She has no cervical adenopathy.  Neurological: She is alert and oriented to person, place, and time. No cranial nerve deficit.  Skin: Skin is warm. No rash noted. Nails show no clubbing.  Psychiatric: She has a normal mood and affect.      Data Reviewed: Basic Metabolic Panel: Recent Labs  Lab 07/12/19 0345 07/13/19 0406 07/13/19 2326 07/14/19 0505 07/15/19 0451 07/16/19 0411  NA 140 141 141 142 138  --   K 3.0* 2.9* 3.9 4.3 3.6  --   CL 109 110 108 111 108  --   CO2 23 24 21* 24 25  --   GLUCOSE 161* 92 209* 217* 152*  --   BUN 32* 26* 21* 20 18  --   CREATININE 0.79 0.66 0.54 0.60 0.59  --   CALCIUM 8.4* 8.1* 8.4* 8.5* 8.5*  --   MG 2.2 2.0 2.0  --  2.0 1.9  PHOS 3.3 2.6  --  3.5 2.9 3.3   Liver Function Tests: Recent Labs  Lab 07/10/19 0412 07/11/19 0440 07/12/19 0345 07/13/19 2326  AST 53* 40 29 18  ALT 30 42 39 29  ALKPHOS 62 59 62 70  BILITOT 0.8 0.8 0.9 0.7  PROT 6.3* 6.5 6.4* 6.7  ALBUMIN 2.6* 2.5* 2.5* 2.3*  Recent Labs  Lab 07/13/19 2326  LIPASE 176*   CBC: Recent Labs  Lab 07/12/19 0345 07/13/19 0406 07/14/19 0505 07/15/19 0451 07/16/19 0636  WBC 9.8 10.4 9.7 11.0* 10.1  NEUTROABS 8.2* 8.2* 8.4* 9.0* 7.7  HGB 9.2* 9.3* 9.4* 9.0* 9.4*  HCT 29.9* 30.9* 32.5* 30.2* 31.6*  MCV 77.1* 79.0* 79.9* 77.6* 78.2*  PLT 178 209 233 375 414*   BNP (last 3 results) Recent Labs    04/29/19 0559 05/23/19 1929 07/08/19 0014  BNP 39.0 19.0 47.0    CBG: Recent Labs  Lab 07/15/19 2008 07/15/19 2341 07/16/19 0359 07/16/19 0924 07/16/19 1134  GLUCAP 165* 169* 144* 70 123*    Recent Results (from the past 240 hour(s))  Urine culture     Status: None   Collection Time: 07/07/19 12:14 AM   Specimen: In/Out Cath Urine  Result Value Ref Range Status    Specimen Description   Final    IN/OUT CATH URINE Performed at [facility], [address], [place], [place] [address]    Special Requests   Final    NONE Performed at [facility], [address], [place], [place] [address]    Culture   Final    NO GROWTH Performed at [facility], [name] [address] [place], [place] [address]    Report Status 07/09/2019 FINAL  Final  Blood Culture (routine x 2)     Status: Abnormal   Collection Time: 07/08/19 12:14 AM   Specimen: BLOOD  Result Value Ref Range Status   Specimen Description   Final    BLOOD LEFT ANTECUBITAL Performed at [facility], [address], [place], [place] [address]    Special Requests   Final    BOTTLES DRAWN AEROBIC AND ANAEROBIC Blood Culture adequate volume Performed at [facility], [address] [place], [place] [address]    Culture  Setup Time   Final    GRAM POSITIVE COCCI IN BOTH AEROBIC AND ANAEROBIC BOTTLES CRITICAL RESULT CALLED TO, READ BACK BY AND VERIFIED WITH: ABBY ELLINGTON AT 8315 ON 07/08/19 SNG Performed at [facility], [place] [address], [place], [place] [address]    Culture METHICILLIN RESISTANT STAPHYLOCOCCUS AUREUS (A)  Final   Report Status 07/10/2019 FINAL  Final   Organism ID, Bacteria METHICILLIN RESISTANT STAPHYLOCOCCUS AUREUS  Final      Susceptibility   Methicillin resistant staphylococcus aureus - MIC*    CIPROFLOXACIN >=8 RESISTANT Resistant     ERYTHROMYCIN >=8 RESISTANT Resistant     GENTAMICIN <=0.5 SENSITIVE Sensitive     OXACILLIN >=4 RESISTANT Resistant     TETRACYCLINE <=1 SENSITIVE Sensitive     VANCOMYCIN 1 SENSITIVE Sensitive     TRIMETH/SULFA <=10 SENSITIVE Sensitive     CLINDAMYCIN >=8 RESISTANT Resistant     RIFAMPIN <=0.5 SENSITIVE Sensitive     Inducible Clindamycin NEGATIVE Sensitive     * METHICILLIN RESISTANT STAPHYLOCOCCUS AUREUS  Blood Culture (routine x 2)     Status: Abnormal   Collection Time: 07/08/19  12:14 AM   Specimen: BLOOD  Result Value Ref Range Status   Specimen Description   Final    BLOOD RIGHT ANTECUBITAL Performed at [facility], [address] [place], [place] [address]    Special Requests   Final    BOTTLES DRAWN AEROBIC AND ANAEROBIC Blood Culture results may not be optimal due to an excessive volume of blood received in culture bottles Performed at [facility], [address] [place], [place] [address]    Culture  Setup Time   Final    GRAM POSITIVE COCCI IN BOTH AEROBIC AND ANAEROBIC BOTTLES CRITICAL VALUE NOTED.  VALUE IS CONSISTENT WITH PREVIOUSLY REPORTED AND CALLED VALUE. Performed at [facility], [address] [place], [place] [address]    Culture (A)  Final    STAPHYLOCOCCUS AUREUS SUSCEPTIBILITIES PERFORMED ON PREVIOUS CULTURE WITHIN THE LAST 5 DAYS. Performed at [facility], [address] [place], [place] [address]    Report Status 07/10/2019 FINAL  Final  Blood Culture ID Panel (Reflexed)     Status: Abnormal   Collection Time: 07/08/19 12:14 AM  Result Value Ref Range Status   Enterococcus species NOT DETECTED NOT DETECTED Final   Listeria monocytogenes NOT DETECTED NOT DETECTED Final   Staphylococcus species DETECTED (A) NOT DETECTED Final    Comment: CRITICAL RESULT CALLED TO, READ BACK BY AND VERIFIED WITH: CHARLES SHANLEVER AT 1136 ON 07/08/19 SNG    Staphylococcus aureus (BCID) DETECTED (A) NOT DETECTED Final    Comment: Methicillin (oxacillin)-resistant Staphylococcus aureus (MRSA). MRSA is predictably resistant to beta-lactam antibiotics (except ceftaroline). Preferred therapy is vancomycin unless clinically contraindicated. Patient requires contact precautions if  hospitalized. CRITICAL RESULT CALLED TO, READ BACK BY AND VERIFIED WITH: CHARLES SHANLEVER AT 1136 ON 07/08/19 SNG    Methicillin resistance DETECTED (A) NOT DETECTED Final    Comment: CRITICAL RESULT CALLED TO, READ BACK BY AND VERIFIED  WITH: CHARLES SHANLEVER AT 1136 ON 07/08/19 SNG    Streptococcus species NOT DETECTED NOT DETECTED Final   Streptococcus agalactiae NOT DETECTED NOT DETECTED Final   Streptococcus pneumoniae NOT DETECTED NOT DETECTED Final   Streptococcus pyogenes NOT DETECTED NOT DETECTED Final   Acinetobacter baumannii NOT DETECTED NOT DETECTED Final   Enterobacteriaceae species NOT DETECTED NOT DETECTED Final   Enterobacter cloacae complex NOT DETECTED NOT DETECTED Final   Escherichia coli NOT DETECTED NOT DETECTED Final   Klebsiella oxytoca NOT DETECTED NOT DETECTED Final   Klebsiella pneumoniae NOT DETECTED NOT DETECTED Final   Proteus species NOT DETECTED NOT DETECTED Final   Serratia marcescens NOT DETECTED NOT DETECTED Final   Haemophilus influenzae NOT DETECTED NOT DETECTED Final   Neisseria meningitidis NOT DETECTED NOT DETECTED Final   Pseudomonas aeruginosa NOT DETECTED NOT DETECTED Final   Candida albicans NOT DETECTED NOT DETECTED Final   Candida glabrata NOT DETECTED NOT DETECTED Final   Candida krusei NOT DETECTED NOT DETECTED Final   Candida parapsilosis NOT DETECTED NOT DETECTED Final   Candida tropicalis NOT DETECTED NOT DETECTED Final    Comment: Performed at [facility], [address] [place], [place] [address]  Respiratory Panel by RT PCR (Flu A&B, Covid) - Nasopharyngeal Swab     Status: None   Collection Time: 07/08/19  1:18 AM   Specimen: Nasopharyngeal Swab  Result Value Ref Range Status   SARS Coronavirus 2 by RT PCR NEGATIVE NEGATIVE Final    Comment: (NOTE) SARS-CoV-2 target nucleic acids are NOT DETECTED. The SARS-CoV-2 RNA is generally detectable in upper respiratoy specimens during the acute phase of infection. The lowest concentration of SARS-CoV-2 viral copies this assay can detect is 131 copies/mL. A negative result does not preclude SARS-Cov-2 infection and should not be used as the sole basis for treatment or other patient management decisions. A  negative result may occur with  improper specimen collection/handling, submission of specimen other than nasopharyngeal swab, presence of viral mutation(s) within the  areas targeted by this assay, and inadequate number of viral copies (<131 copies/mL). A negative result must be combined with clinical observations, patient history, and epidemiological information. The expected result is Negative. Fact Sheet for Patients:  https://www.moore.com/ Fact Sheet for Healthcare Providers:  https://www.young.biz/ This test is not yet ap proved or cleared by the [place] FDA and  has been authorized for detection and/or diagnosis of SARS-CoV-2 by FDA under an Emergency Use Authorization (EUA). This EUA will remain  in effect (meaning this test can be used) for the duration of the COVID-19 declaration under Section 564(b)(1) of the Act, 21 U.S.C. section 360bbb-3(b)(1), unless the authorization is terminated or revoked sooner.    Influenza A by PCR NEGATIVE NEGATIVE Final   Influenza B by PCR NEGATIVE NEGATIVE Final    Comment: (NOTE) The Xpert Xpress SARS-CoV-2/FLU/RSV assay is intended as an aid in  the diagnosis of influenza from Nasopharyngeal swab specimens and  should not be used as a sole basis for treatment. Nasal washings and  aspirates are unacceptable for Xpert Xpress SARS-CoV-2/FLU/RSV  testing. Fact Sheet for Patients: https://www.moore.com/ Fact Sheet for Healthcare Providers: https://www.young.biz/ This test is not yet approved or cleared by the [place] FDA and  has been authorized for detection and/or diagnosis of SARS-CoV-2 by  FDA under an Emergency Use Authorization (EUA). This EUA will remain  in effect (meaning this test can be used) for the duration of the  Covid-19 declaration under Section 564(b)(1) of the Act, 21  U.S.C. section 360bbb-3(b)(1), unless the authorization is   terminated or revoked. Performed at [facility], [address] [place], [place] [address]   MRSA PCR Screening     Status: Abnormal   Collection Time: 07/08/19  7:51 AM   Specimen: Nasal Mucosa; Nasopharyngeal  Result Value Ref Range Status   MRSA by PCR POSITIVE (A) NEGATIVE Final    Comment:        The GeneXpert MRSA Assay (FDA approved for NASAL specimens only), is one component of a comprehensive MRSA colonization surveillance program. It is not intended to diagnose MRSA infection nor to guide or monitor treatment for MRSA infections. RESULT CALLED TO, READ BACK BY AND VERIFIED WITH: VICK GREGORY AT 1305 07/08/19.PMF Performed at [facility], [address] [place], [place] [address]   Culture, blood (Routine X 2) w Reflex to ID Panel     Status: None   Collection Time: 07/09/19  2:02 PM   Specimen: BLOOD  Result Value Ref Range Status   Specimen Description BLOOD BLOOD RIGHT HAND  Final   Special Requests   Final    BOTTLES DRAWN AEROBIC AND ANAEROBIC Blood Culture adequate volume   Culture   Final    NO GROWTH 5 DAYS Performed at [facility], [address] [place], [place] [address]    Report Status 07/14/2019 FINAL  Final  Culture, blood (Routine X 2) w Reflex to ID Panel     Status: None   Collection Time: 07/09/19  3:57 PM   Specimen: BLOOD  Result Value Ref Range Status   Specimen Description BLOOD BLOOD RIGHT ARM  Final   Special Requests   Final    BOTTLES DRAWN AEROBIC ONLY Blood Culture adequate volume   Culture   Final    NO GROWTH 5 DAYS Performed at [facility], [address], [place], [place] [address]    Report Status 07/14/2019 FINAL  Final     Studies: CT HEAD WO  CONTRAST  Result Date: 07/14/2019 CLINICAL DATA:  Acute onset altered level of consciousness, neurologic deficit EXAM: CT HEAD WITHOUT CONTRAST TECHNIQUE: Contiguous axial images were obtained from the base of the skull through the  vertex without intravenous contrast. COMPARISON:  07/09/2019 FINDINGS: Brain: No acute infarct or hemorrhage. Lateral ventricles and midline structures are unremarkable. No acute extra-axial fluid collections. No mass effect. Vascular: No hyperdense vessel or unexpected calcification. Skull: Normal. Negative for fracture or focal lesion. Sinuses/Orbits: No acute finding. Other: None IMPRESSION: 1. Stable head CT, no acute process. Electronically Signed   By: Sharlet Salina M.D.   On: 07/14/2019 19:27   CT ABDOMEN PELVIS W CONTRAST  Result Date: 07/14/2019 CLINICAL DATA:  Acute onset of abdominal pain. Altered mental status. EXAM: CT ABDOMEN AND PELVIS WITH CONTRAST TECHNIQUE: Multidetector CT imaging of the abdomen and pelvis was performed using the standard protocol following bolus administration of intravenous contrast. CONTRAST:  OMNIPAQUE IOHEXOL 300 MG/ML  SOLN COMPARISON:  Chest and abdominal CT 6 days ago 07/08/2019. Chest CTA 07/09/2019. FINDINGS: Lower chest: Small to moderate left pleural effusion with adjacent compressive atelectasis, new from prior imaging. Mild cardiomegaly. Prominent right infrahilar node which was seen on prior exam. Hepatobiliary: Mild hepatomegaly with liver measuring 20.9 cm cranial caudal. No focal lesion. Clips in the gallbladder fossa postcholecystectomy. No biliary dilatation. Pancreas: No ductal dilatation or inflammation. Spleen: Enlarged spanning 16.6 cm AP. Multiple splenic hypodensities are better defined than on prior exam and most consistent with splenic infarcts. Splenic vein is patent. Adrenals/Urinary Tract: Normal adrenal glands. No hydronephrosis. Bilateral renal parenchymal thinning. Mild symmetric perinephric edema. No significant excretion on delayed phase imaging suggesting underlying renal dysfunction. Tiny hypodensity in the mid posterior left kidney is too small to characterize but likely small cyst. Urinary bladder is decompressed by Foley  catheter. Stomach/Bowel: Enteric tube in the stomach. Stomach mildly distended with air and ingested contents/contrast. Normal positioning of the ligament of Treitz. No small bowel obstruction or inflammation. Administered enteric contrast reaches the distal small bowel/cecum. Normal appendix. Moderate stool in the ascending colon. Transverse colon is mildly distended with air. Liquid and solid stool in the descending and sigmoid colon. Sigmoid colon is tortuous. Decreased rectal stool burden from previous. No colonic inflammation. Vascular/Lymphatic: Aortic atherosclerosis without aneurysm. The portal and splenic veins are patent. Few prominent left external iliac and inguinal nodes are again seen, unchanged from prior. No new or progressive adenopathy. Reproductive: Status post hysterectomy. No adnexal masses. Other: Small volume perihepatic and perisplenic ascites, new from prior exam. Minimal free fluid tracks into the pelvis. Right lateral abdominal wall hernia contains small amount of fluid/ascites, similar to prior exam. No bowel involvement. No free air or intra-abdominal abscess. Pre sacral stimulator with battery pack in the left gluteal soft tissues. Generalized subcutaneous edema in the flanks, slightly increased. Musculoskeletal: There are no acute or suspicious osseous abnormalities. Again seen degenerative change in the lower lumbar spine. IMPRESSION: 1. Small to moderate left pleural effusion with adjacent compressive atelectasis, new from recent chest CT. Small volume of perihepatic and perisplenic ascites, also new. 2. Splenomegaly with multiple splenic infarcts, better defined than on prior exam. 3. Improved bilateral perinephric edema, mild symmetric residual persists. Absent excretion on delayed phase imaging can be seen with underlying renal dysfunction. 4. Right lateral abdominal wall hernia containing small amount of fluid/ascites, similar to prior. No bowel involvement. 5. Additional chronic  findings are stable. Aortic Atherosclerosis (ICD10-I70.0). Electronically Signed   By: Ivette Loyal.D.  On: 07/14/2019 19:34   ECHO TEE  Result Date: 07/16/2019 Debbe Odea, MD     07/16/2019  8:36 AM Transesophageal Echocardiogram : Indication: bacteremia Procedure: 9cc of viscous lidocaine were given orally to provide local anesthesia to the oropharynx. The patient was positioned supine on the left side, bite block provided. The patient was sedated per anesthesia team.  Using digital technique an omniplane probe was advanced into the esophagus without incident.  sedation: Per anesthesia team See report in EPIC  for complete details: In brief, imaging revealed normal LV function with no RWMAs and no mural apical thrombus. Estimated ejection fraction was 60%. There was no evidence for endocarditis Imaging of the septum showed no ASD or VSD 2D and color flow confirmed no PFO The LA was well visualized in orthogonal views.  There was no spontaneous contrast and no thrombus in the LA and LA appendage The descending thoracic aorta had no  mural aortic debris with no evidence of aneurysmal dilation or disection Debbe Odea 07/16/2019 8:34 AM   [name] EKG SITE RITE  Result Date: 07/15/2019 If Site Rite image not attached, placement could not be confirmed due to current cardiac rhythm.   Scheduled Meds: . busPIRone  10 mg Oral BID  . Chlorhexidine Gluconate Cloth  6 each Topical Daily  . famotidine  20 mg Oral BID  . insulin aspart  0-20 Units Subcutaneous Q4H  . insulin detemir  22 Units Subcutaneous BID  . levothyroxine  88 mcg Oral QAC breakfast  . mouth rinse  15 mL Mouth Rinse BID  . methylPREDNISolone (SOLU-MEDROL) injection  40 mg Intravenous Daily  . pramipexole  0.125 mg Oral BID  . Ensure Max Protein  11 oz Oral TID BM   Continuous Infusions: . sodium chloride 1,000 mL (07/16/19 0726)  . sodium chloride Stopped (07/16/19 0955)  . vancomycin 1,750 mg (07/16/19 0309)     Assessment/Plan:  1. MRSA sepsis present on admission.  On vancomycin.  TEE done today is negative for vegetations.  Will confirm with infectious disease on length of therapy for IV antibiotics.  Patient already has PICC line.  Patient does not want to go well to rehab and would like to go home with home health. 2. Ileus seen on x-ray.  CT scan of the abdomen negative.  Patient feeling better and would like to try solid food today. 3. Right arm weakness.  Likely related to rheumatoid arthritis.  CT scan of the head negative. 4. Splenic infarct.  Start Eliquis today. 5. Rheumatoid arthritis flare with bilateral shoulder pain.  Continue Solu-Medrol. 6. Morbid obesity 7. Hypothyroidism unspecified on levothyroxine 8. Type 2 diabetes mellitus on detemir insulin and sliding scale 9. Anxiety depression continue psychiatric medications. 10. Brief atrial fibrillation which converted to sinus rhythm. 11. Weakness.  Physical therapy recommends rehab.  Patient is not going to go to rehab.  Will have to set up home health for IV antibiotic  Code Status:     Code Status Orders  (From admission, onward)         Start     Ordered   07/08/19 0337  Full code  Continuous     07/08/19 0340        Code Status History    Date Active Date Inactive Code Status Order ID Comments User Context   04/07/2019 0552 05/04/2019 2052 Full Code [id number]  Lorretta Harp, MD ED   03/27/2019 1419 03/31/2019 1608 Full Code [id number]  Arrien, York Ram, MD Inpatient  03/27/2019 0446 03/27/2019 1402 Full Code [id number]  Mansy, Vernetta [place], MD ED   Advance Care Planning Activity     Family Communication: Spoke with the patient's mother on the phone Disposition Plan: The patient is going to refuse rehab and will go home with home health.  I advised her to do better today with physical therapy.  Will speak with transitional care team to set up home health for IV antibiotics.  Infectious disease specialist to determine  length of therapy with TEE being negative.  With the patient's recent ileus she needs to tolerate diet prior to disposition.  Hopefully home with home health in the next day or so.  Consultants:  Cardiology  Infectious disease  Antibiotics:  IV vancomycin  Time spent: 27 minutes  Shemeika Starzyk [employer]

## 2019-07-16 NOTE — Progress Notes (Signed)
*  PRELIMINARY RESULTS* Echocardiogram Echocardiogram Transesophageal has been performed.  [name] [name] 07/16/2019, 8:43 AM

## 2019-07-16 NOTE — Transfer of Care (Signed)
Immediate Anesthesia Transfer of Care Note  Patient: ADISEN BENNION  Procedure(s) Performed: TRANSESOPHAGEAL ECHOCARDIOGRAM (TEE) (N/A )  Patient Location: [facility]  Anesthesia Type:General  Level of Consciousness: drowsy  Airway & Oxygen Therapy: Patient Spontanous Breathing and Patient connected to nasal cannula oxygen  Post-op Assessment: Report given to RN and Post -op Vital signs reviewed and stable  Post vital signs: Reviewed and stable  Last Vitals:  Vitals Value Taken Time  BP 133/105 07/16/19 0824  Temp    Pulse 76 07/16/19 0838  Resp 18 07/16/19 0838  SpO2 94 % 07/16/19 0838  Vitals shown include unvalidated device data.  Last Pain:  Vitals:   07/16/19 5271  TempSrc: Oral  PainSc: 6          Complications: No apparent anesthesia complications

## 2019-07-16 NOTE — Progress Notes (Addendum)
Progress Note  Patient Name: [name] [name] Date of Encounter: 07/16/2019  Primary Cardiologist: Dr. Mariah Milling  Subjective   No acute events overnight. Denies fevers, chills, palpitatons.   Inpatient Medications    Scheduled Meds: . busPIRone  10 mg Oral BID  . Chlorhexidine Gluconate Cloth  6 each Topical Daily  . famotidine  20 mg Oral BID  . insulin aspart  0-20 Units Subcutaneous Q4H  . insulin detemir  22 Units Subcutaneous BID  . levothyroxine  88 mcg Oral QAC breakfast  . mouth rinse  15 mL Mouth Rinse BID  . methylPREDNISolone (SOLU-MEDROL) injection  40 mg Intravenous Daily  . pramipexole  0.125 mg Oral BID  . Ensure Max Protein  11 oz Oral TID BM  . sodium chloride flush  10-40 mL Intracatheter Q12H  . sodium chloride flush  10-40 mL Intracatheter Q12H   Continuous Infusions: . sodium chloride 1,000 mL (07/16/19 0726)  . sodium chloride    . heparin Stopped (07/16/19 0729)  . vancomycin 1,750 mg (07/16/19 0309)   PRN Meds: acetaminophen **OR** acetaminophen, ALPRAZolam, HYDROmorphone (DILAUDID) injection, LORazepam, sodium chloride flush, sodium chloride flush   Vital Signs    Vitals:   07/16/19 0400 07/16/19 0500 07/16/19 0600 07/16/19 0712  BP: (!) 121/59     Pulse: 70 61 (!) 58 65  Resp: 17 16 15 18   Temp: 98.4 F (36.9 C)   98.6 F (37 C)  TempSrc: Oral   Oral  SpO2: 96% 96% 95% 100%  Weight:      Height:        Intake/Output Summary (Last 24 hours) at 07/16/2019 0823 Last data filed at 07/16/2019 0600 Gross per 24 hour  Intake 777.33 ml  Output 1100 ml  Net -322.67 ml   Last 3 Weights 07/07/2019 06/29/2019 06/04/2019  Weight (lbs) 350 lb 305 lb 9.6 oz 310 lb 12.8 oz  Weight (kg) 158.759 kg 138.619 kg 140.978 kg  Some encounter information is confidential and restricted. Go to Review Flowsheets activity to see all data.      Telemetry    sinus - Personally Reviewed   Physical Exam   GEN: No acute distress, obese.   Neck: No  JVD Cardiac: RRR, no murmurs, rubs, or gallops.  Respiratory: decreased bs at bases. GI: Soft, nontender, distended  MS: No edema; No deformity. Neuro:  Nonfocal  Psych: Normal affect   Labs    High Sensitivity Troponin:   Recent Labs  Lab 07/08/19 0702 07/09/19 0500 07/14/19 0505  TROPONINIHS 26* 24* 17      Chemistry Recent Labs  Lab 07/11/19 0440 07/11/19 0440 07/12/19 0345 07/13/19 0406 07/13/19 2326 07/14/19 0505 07/15/19 0451  NA 141   < > 140   < > 141 142 138  K 3.4*   < > 3.0*   < > 3.9 4.3 3.6  CL 111   < > 109   < > 108 111 108  CO2 21*   < > 23   < > 21* 24 25  GLUCOSE 154*   < > 161*   < > 209* 217* 152*  BUN 50*   < > 32*   < > 21* 20 18  CREATININE 1.18*   < > 0.79   < > 0.54 0.60 0.59  CALCIUM 8.3*   < > 8.4*   < > 8.4* 8.5* 8.5*  PROT 6.5  --  6.4*  --  6.7  --   --  ALBUMIN 2.5*  --  2.5*  --  2.3*  --   --   AST 40  --  29  --  18  --   --   ALT 42  --  39  --  29  --   --   ALKPHOS 59  --  62  --  70  --   --   BILITOT 0.8  --  0.9  --  0.7  --   --   GFRNONAA 51*   < > >60   < > >60 >60 >60  GFRAA 59*   < > >60   < > >60 >60 >60  ANIONGAP 9   < > 8   < > 12 7 5    < > = values in this interval not displayed.     Hematology Recent Labs  Lab 07/14/19 0505 07/15/19 0451 07/16/19 0636  WBC 9.7 11.0* 10.1  RBC 4.07 3.89 4.04  HGB 9.4* 9.0* 9.4*  HCT 32.5* 30.2* 31.6*  MCV 79.9* 77.6* 78.2*  MCH 23.1* 23.1* 23.3*  MCHC 28.9* 29.8* 29.7*  RDW 17.2* 17.0* 16.8*  PLT 233 375 414*    BNPNo results for input(s): BNP, PROBNP in the last 168 hours.   DDimer No results for input(s): DDIMER in the last 168 hours.   Radiology    CT HEAD WO CONTRAST  Result Date: 07/14/2019 CLINICAL DATA:  Acute onset altered level of consciousness, neurologic deficit EXAM: CT HEAD WITHOUT CONTRAST TECHNIQUE: Contiguous axial images were obtained from the base of the skull through the vertex without intravenous contrast. COMPARISON:  07/09/2019 FINDINGS:  Brain: No acute infarct or hemorrhage. Lateral ventricles and midline structures are unremarkable. No acute extra-axial fluid collections. No mass effect. Vascular: No hyperdense vessel or unexpected calcification. Skull: Normal. Negative for fracture or focal lesion. Sinuses/Orbits: No acute finding. Other: None IMPRESSION: 1. Stable head CT, no acute process. Electronically Signed   By: Randa Ngo M.D.   On: 07/14/2019 19:27   CT ABDOMEN PELVIS W CONTRAST  Result Date: 07/14/2019 CLINICAL DATA:  Acute onset of abdominal pain. Altered mental status. EXAM: CT ABDOMEN AND PELVIS WITH CONTRAST TECHNIQUE: Multidetector CT imaging of the abdomen and pelvis was performed using the standard protocol following bolus administration of intravenous contrast. CONTRAST:  151mL OMNIPAQUE IOHEXOL 300 MG/ML  SOLN COMPARISON:  Chest and abdominal CT 6 days ago 07/08/2019. Chest CTA 07/09/2019. FINDINGS: Lower chest: Small to moderate left pleural effusion with adjacent compressive atelectasis, new from prior imaging. Mild cardiomegaly. Prominent right infrahilar node which was seen on prior exam. Hepatobiliary: Mild hepatomegaly with liver measuring 20.9 cm cranial caudal. No focal lesion. Clips in the gallbladder fossa postcholecystectomy. No biliary dilatation. Pancreas: No ductal dilatation or inflammation. Spleen: Enlarged spanning 16.6 cm AP. Multiple splenic hypodensities are better defined than on prior exam and most consistent with splenic infarcts. Splenic vein is patent. Adrenals/Urinary Tract: Normal adrenal glands. No hydronephrosis. Bilateral renal parenchymal thinning. Mild symmetric perinephric edema. No significant excretion on delayed phase imaging suggesting underlying renal dysfunction. Tiny hypodensity in the mid posterior left kidney is too small to characterize but likely small cyst. Urinary bladder is decompressed by Foley catheter. Stomach/Bowel: Enteric tube in the stomach. Stomach mildly distended  with air and ingested contents/contrast. Normal positioning of the ligament of Treitz. No small bowel obstruction or inflammation. Administered enteric contrast reaches the distal small bowel/cecum. Normal appendix. Moderate stool in the ascending colon. Transverse colon is mildly distended with air.  Liquid and solid stool in the descending and sigmoid colon. Sigmoid colon is tortuous. Decreased rectal stool burden from previous. No colonic inflammation. Vascular/Lymphatic: Aortic atherosclerosis without aneurysm. The portal and splenic veins are patent. Few prominent left external iliac and inguinal nodes are again seen, unchanged from prior. No new or progressive adenopathy. Reproductive: Status post hysterectomy. No adnexal masses. Other: Small volume perihepatic and perisplenic ascites, new from prior exam. Minimal free fluid tracks into the pelvis. Right lateral abdominal wall hernia contains small amount of fluid/ascites, similar to prior exam. No bowel involvement. No free air or intra-abdominal abscess. Pre sacral stimulator with battery pack in the left gluteal soft tissues. Generalized subcutaneous edema in the flanks, slightly increased. Musculoskeletal: There are no acute or suspicious osseous abnormalities. Again seen degenerative change in the lower lumbar spine. IMPRESSION: 1. Small to moderate left pleural effusion with adjacent compressive atelectasis, new from recent chest CT. Small volume of perihepatic and perisplenic ascites, also new. 2. Splenomegaly with multiple splenic infarcts, better defined than on prior exam. 3. Improved bilateral perinephric edema, mild symmetric residual persists. Absent excretion on delayed phase imaging can be seen with underlying renal dysfunction. 4. Right lateral abdominal wall hernia containing small amount of fluid/ascites, similar to prior. No bowel involvement. 5. Additional chronic findings are stable. Aortic Atherosclerosis (ICD10-I70.0). Electronically  Signed   By: Narda Rutherford M.D.   On: 07/14/2019 19:34   DG Abd Portable 2V  Result Date: 07/14/2019 CLINICAL DATA:  Abdominal pain. EXAM: PORTABLE ABDOMEN - 2 VIEW COMPARISON:  Abdominal radiograph 07/13/2019 FINDINGS: Monitoring leads overlie the patient. Enteric tube projects over the stomach. Redemonstrated gaseous distended loops of small and large bowel throughout the abdomen. Lumbar spine degenerative changes. IMPRESSION: Re demonstrated gaseous distended loops of small and large bowel favored to represent ileus. Electronically Signed   By: Annia Belt M.D.   On: 07/14/2019 12:53   [name] EKG SITE RITE  Result Date: 07/15/2019 If Site Rite image not attached, placement could not be confirmed due to current cardiac rhythm.   Cardiac Studies   TTE 07/08/2019 1. Left ventricular ejection fraction, by estimation, is 60 to 65%. The  left ventricle has normal function. The left ventricle has no regional  wall motion abnormalities. Left ventricular diastolic parameters are  consistent with Grade I diastolic  dysfunction (impaired relaxation).  2. Right ventricular systolic function is normal. The right ventricular  size is normal.  3. The inferior vena cava is normal in size with greater than 50%  respiratory variability, suggesting right atrial pressure of 3 mmHg.   Patient Profile     59 y.o. female with a hx of hypertension, diabetes type 2, morbid obesity, depression/anxiety, diastolic CHF, December 2020 with ARDS secondary to COVID-19 pneumonia, presenting to the hospital with body ache, shortness of breath, confusion, respiratory distress, somnolence  Assessment & Plan    1. MRSA bacteremia -plan for TEE today to evaluate for endocarditis -abx as per primary team  2. Brief episode of afib -currently in sinus rhythm. No further episodes of afib -secondary to current illness -will stop heparin/anticoagulation  -may consider a/c only if afib re-occurs    Signed, Debbe Odea, MD  07/16/2019, 8:23 AM    Addendum TEE with no evidence for endocarditis. Antibiotics as per primary team No further cardiac intervention planned. Please let us know if further input is needed. Thank you

## 2019-07-16 NOTE — Progress Notes (Signed)
Ch visited pt. per [facility]'s referral.  Pt. sitting up in bed, just finished breakfast.  Pt. said she was hospitalized in Dec. for COVID; still has trouble breathing as result of COVID --> uses O2.  Pt. came to hospital 1wk. ago for MRSA infection from sores in her nose from home O2.  Pt. reports feeling much better now -- hopes to go home soon, provided she can walk.  PT/OT scheduled to come today, pt. says.  Pt.'s mother, dtr., friends are supporting pt. well and will help her recoup once she is discharged, she says.  Pt. requested prayer for recovery and health.  No further needs expressed at this time.    07/16/19 1100  Clinical Encounter Type  Visited With Patient  Visit Type Follow-up  Referral From Chaplain  Spiritual Encounters  Spiritual Needs Prayer;Emotional  Stress Factors  Patient Stress Factors Health changes

## 2019-07-16 NOTE — TOC Progression Note (Addendum)
Transition of Care [facility]) - Progression Note    Patient Details  Name: [name] [name] MRN: [id number] Date of Birth: [birth year]  Transition of Care [facility]) CM/SW Contact  Liliana Cline, LCSW Phone Number: 07/16/2019, 11:35 AM  Clinical Narrative:      CSW called and spoke with patient's mother, [name] [name], regarding PT/OT recommending SNF. [name] [name] stated she and patient talked about this recommendation and do not want patient to go to a SNF. [name] [name] reported patient has had IVs at home before, and knows how to manage them. [name] [name] asked about Home Health being an option. [name] [name] reported patient used [facility] in the past and they would like to use them again if they have availability, otherwise she did not have an agency preference. CSW will reach out to [facility] then other agencies if needed.   11:50- CSW informed [facility] Representative [name] of patient's information and that she will likely discharge on IV antibiotics.   CSW called [facility], left a voicemail for [place] requesting a return call on availability.   2:30- CSW attempted call to [facility] again. Spoke with [facility]. Ernestine reported they are unable to take any referrals at this time outside of [facility] and that patient has not had services through them since 2020. CSW reached out to [place] with [facility] to check availability.   3:45- [facility] Representative Barbara Cower reported he cannot accept patient. CSW reached out to [place] with [facility]. Brittney reported they cannot accept patient. CSW reached out to [place] with [facility], Kandee Keen with [facility] and Rosey Bath with [facility]. Awaiting follow-up.  CSW called and spoke with patient's mother, [name] [name] again. Informed [name] [name] that based on PT and OT's recent notes, patient will most likely need 24 hour care when she gets home. Also informed [name] [name] that CSW will continue to try to find coverage for Home Health, but it may be difficult due to patient's insurance. Informed [name] [name] we  could find RN coverage but patient would really need PT and OT as well. [name] [name] verbalized understanding reported the family is able to provide 24 hour care in the home and feels they could meet her needs. [name] [name] reported with patient's extensive needs, they still would prefer home with Home Health and do not want patient to go to a SNF. CSW will continue to follow up with Home Health agencies to find coverage.  4:00- Rosey Bath from [facility] reported they cannot accept patient.    3/30Elnita Maxwell with [facility] and Kandee Keen with [facility] both reported they cannot accept patient for home health services.  Cheryl with [facility] reported they have everything set up for patient when discharged for her IV meds.        Barriers to Discharge: Continued Medical Work up  Expected Discharge Plan and Services         Living arrangements for the past 2 months: Single Family Home                                       Social Determinants of Health (SDOH) Interventions    Readmission Risk Interventions Readmission Risk Prevention Plan 07/09/2019 05/03/2019  Transportation Screening Complete Complete  Medication Review [facility]) Complete Referral to Pharmacy  PCP or Specialist appointment within 3-5 days of discharge Complete Complete  HRI or Home Care Consult Complete Complete  Palliative Care Screening - Not Applicable  Skilled Nursing Facility - Not  Applicable  Some recent data might be hidden

## 2019-07-16 NOTE — Progress Notes (Signed)
ID Pt feeling better Had TEE today Some pain left shoulder but movement not restricted  O/e awake and alert BP 127/68   Pulse 63   Temp 98.2 F (36.8 C)   Resp 17   Ht 5\' 10"  (1.778 m)   Wt (!) 158.8 kg   LMP 04/20/2001   SpO2 97%   BMI 50.22 kg/m   Rt PICC  Chest b/l air entry [id number] Left shoulder movt not restricted- no erythema or warmth  Labs CBC Latest Ref Rng & Units 07/16/2019 07/15/2019 07/14/2019  WBC 4.0 - 10.5 K/uL 10.1 11.0(H) 9.7  Hemoglobin 12.0 - 15.0 g/dL 07/16/2019) 6.9(G) 2.9(B)  Hematocrit 36.0 - 46.0 % 31.6(L) 30.2(L) 32.5(L)  Platelets 150 - 400 K/uL 414(H) 375 233    CMP Latest Ref Rng & Units 07/15/2019 07/14/2019 07/13/2019  Glucose 70 - 99 mg/dL 07/15/2019) 132(G) 401(U)  BUN 6 - 20 mg/dL 18 20 272(Z)  Creatinine 0.44 - 1.00 mg/dL 36(U 4.40 3.47  Sodium 135 - 145 mmol/L 138 142 141  Potassium 3.5 - 5.1 mmol/L 3.6 4.3 3.9  Chloride 98 - 111 mmol/L 108 111 108  CO2 22 - 32 mmol/L 25 24 21(L)  Calcium 8.9 - 10.3 mg/dL 4.25) 9.5(G) 3.8(V)  Total Protein 6.5 - 8.1 g/dL - - 6.7  Total Bilirubin 0.3 - 1.2 mg/dL - - 0.7  Alkaline Phos 38 - 126 U/L - - 70  AST 15 - 41 U/L - - 18  ALT 0 - 44 U/L - - 29    BC 3/21 MRSA 3/22 BC NG  Impression/Recommendation MRSA bacteremia- with splenic infarcts concern for endocarditis TEE Neg- but will be treated like endocarditis Repeat BC frm 3/22 neg On vanco- will need atleast 6 weeks Cr 0.56  Left shoulder pain- could be from rheumatoid arthritis- clinically does not look like septic arthritis Seen by ortho - full range of shoulder movt- septic arthritis not questioned-   H/o MRSA infections in the past- Rt toes- NOV 2019, May 2020  Rheumatoid arthritis was on methotrexate-on hold now Diabetes mellitus  H/o COVID  With resp involvement Dec 2019  Discussed the management with patient and her nurse

## 2019-07-16 NOTE — Anesthesia Procedure Notes (Signed)
Date/Time: 07/16/2019 8:12 AM Performed by: Lynden Oxford, CRNA Pre-anesthesia Checklist: Patient identified, Emergency Drugs available, Suction available, Patient being monitored and Timeout performed Patient Re-evaluated:Patient Re-evaluated prior to induction Oxygen Delivery Method: Supernova nasal CPAP Preoxygenation: Pre-oxygenation with 100% oxygen Induction Type: IV induction Placement Confirmation: positive ETCO2 Comments: SUPERNOVA NASAL CPAP APPLIED. SPONTANEOUS VENTILATION

## 2019-07-16 NOTE — Progress Notes (Signed)
PT Cancellation Note  Patient Details Name: [name] [name] MRN: [id number] DOB: 07-Feb-1961   Cancelled Treatment:    Reason Eval/Treat Not Completed: Other (comment). Pt at TEE, will re-attempt another time.   Audrinna Sherman 07/16/2019, 9:13 AM  Elizabeth Palau, PT, DPT [phone]

## 2019-07-16 NOTE — Progress Notes (Signed)
ANTICOAGULATION CONSULT NOTE - Initial Consult  Pharmacy Consult for Heparin Indication: splenic infarct   Allergies  Allergen Reactions  . Cephalexin Hives  . Codeine Palpitations, Nausea Only, Nausea And Vomiting, Rash and Shortness Of Breath    "makes heart fly, she gets flushed and passes out"  . Doxycycline Rash  . Propoxyphene Rash and Shortness Of Breath    Increase heart rate  . Sulfa Antibiotics Palpitations, Nausea Only, Shortness Of Breath and Hives    "makes heart fly, she gets flushed and passes out"  . Lovenox [Enoxaparin Sodium] Hives  . Hydrocodone Nausea And Vomiting    Hear racing & breaks out into a cold sweat.  . Meropenem Rash    Erythematous, hot, pruritic rash over arms, chest, back, abdomen, and face occurred at the end of meropenem infusion on 02/22/18    Patient Measurements: Height: 5\' 10"  (177.8 cm) Weight: (!) 350 lb (158.8 kg) IBW/kg (Calculated) : 68.5 HEPARIN DW (KG): 107.6  Vital Signs: Temp: 98.6 F (37 C) (03/29 0712) Temp Source: Oral (03/29 0712) BP: 121/59 (03/29 0400) Pulse Rate: 65 (03/29 0712)  Labs: Recent Labs    07/13/19 2326 07/14/19 0258 07/14/19 0505 07/14/19 0505 07/14/19 1023 07/15/19 0005 07/15/19 0451 07/16/19 0636  HGB  --   --  9.4*   < >  --   --  9.0* 9.4*  HCT  --   --  32.5*  --   --   --  30.2* 31.6*  PLT  --   --  233  --   --   --  375 414*  HEPARINUNFRC  --    < >  --   --    < > 0.50 0.47 0.51  CREATININE 0.54  --  0.60  --   --   --  0.59  --   TROPONINIHS  --   --  17  --   --   --   --   --    < > = values in this interval not displayed.    Estimated Creatinine Clearance: 128.1 mL/min (by C-G formula based on SCr of 0.59 mg/dL).   Medical History: Past Medical History:  Diagnosis Date  . Abdominal wall hernia 01/29/2013  . Anxiety   . Arthritis    Rheumatoid  . C. difficile colitis   . Chronic diastolic heart failure (HCC)   . COVID-19 03/23/2019   Diagnosed at [facility] (send-out) on  03/23/2019  . Depression   . Diabetes mellitus    states no meds or diet restrictions  at present  . Diastolic CHF (HCC)   . Esophagitis   . Fluid retention   . GERD (gastroesophageal reflux disease)   . Hiatal hernia   . Hypertension   . Hypokalemia due to loss of potassium 10/21/2015   Overview:  Associated with 3 weeks of diarrhea  And QT prolongation.  . Hypothyroidism   . IBS (irritable bowel syndrome)   . Moderate episode of recurrent major depressive disorder (HCC) 06/03/2004  . Morbid obesity (HCC)   . MRSA (methicillin resistant Staphylococcus aureus) infection 11/2017   left inner thigh abcess  . Neurogenic bladder    has pacemaker  . Neuropathy   . Obesity   . Panic attacks   . Pneumonia due to COVID-19 virus   . Rheumatoid arthritis (HCC)   . Sleep apnea    STATES SEVERE, CANT TOLERATE MASK- LAST STUDY YEARS AGO    Medications:  Medications Prior to  Admission  Medication Sig Dispense Refill Last Dose  . ALPRAZolam (XANAX) 0.5 MG tablet Take 1 tablet (0.5 mg total) by mouth 2 (two) times daily as needed for anxiety. 10 tablet 0 Unknown at PRN  . ARIPiprazole (ABILIFY) 5 MG tablet TAKE 1 TABLET BY MOUTH ONCE DAILY. (Patient taking differently: Take 5 mg by mouth daily. ) 28 tablet 11 24+ hours at Unknown  . atorvastatin (LIPITOR) 80 MG tablet Take 80 mg by mouth daily at 6 PM.    72+ hours at Unknown  . benzonatate (TESSALON PERLES) 100 MG capsule Take 1 capsule (100 mg total) by mouth 3 (three) times daily as needed for cough. 30 capsule 0 Unknown at PRN  . budesonide (PULMICORT) 0.5 MG/2ML nebulizer solution Take 2 mLs (0.5 mg total) by nebulization 2 (two) times daily. 120 mL 11 72+ hours at Unknown  . busPIRone (BUSPAR) 10 MG tablet TAKE 1 TABLET BY MOUTH TWICE DAILY (Patient taking differently: Take 10 mg by mouth 2 (two) times daily. ) 56 tablet 11 72+ hours at Unknown  . Cholecalciferol (VITAMIN D3) 125 MCG (5000 UT) CAPS Take 1 capsule (5,000 Units total) by mouth  daily with breakfast. Take along with calcium and magnesium. 90 capsule 3 72+ hours at Unknown  . Ensure Max Protein (ENSURE MAX PROTEIN) LIQD Take 330 mLs (11 oz total) by mouth 2 (two) times daily between meals. 10000 mL 0   . famotidine (PEPCID) 20 MG tablet Take 1 tablet (20 mg total) by mouth daily. 30 tablet 0 72+ hours at Unknown  . FLUoxetine (PROZAC) 20 MG capsule Take 1 capsule (20 mg total) by mouth daily. TAKE (1) CAPSULE BY MOUTH ONCE DAILY. (Patient taking differently: Take 20 mg by mouth daily. ) 30 capsule 6 72+ hours at Unknown  . folic acid (FOLVITE) 1 MG tablet Take 1 mg by mouth daily.    72+ hours at Unknown  . furosemide (LASIX) 20 MG tablet Take 1 tablet (20 mg total) by mouth daily as needed for fluid or edema (weight gain >3 lbs in 1 day or >5 lbs in 2 days). 30 tablet 0 Unknown at PRN  . GNP CALCIUM CITRATE+D MAXIMUM 315-250 MG-UNIT TABS Take 1 tablet by mouth daily.   72+ hours at Unknown  . Insulin Degludec (TRESIBA FLEXTOUCH) 200 UNIT/ML SOPN Inject 65-100 Units into the skin daily.   72+ hours at Unknown  . insulin lispro (HUMALOG) 100 UNIT/ML injection Inject 20 Units into the skin 3 (three) times daily before meals.     Marland Kitchen levothyroxine (SYNTHROID, LEVOTHROID) 88 MCG tablet Take 88 mcg by mouth daily before breakfast.   72+ hours at Unknown  . linagliptin (TRADJENTA) 5 MG TABS tablet Take 1 tablet (5 mg total) by mouth daily. 30 tablet 0 72+ hours at Unknown  . methotrexate 2.5 MG tablet Take 10 mg by mouth every Wednesday.    Past Week at Unknown time  . polyethylene glycol (MIRALAX / GLYCOLAX) 17 g packet Take 17 g by mouth daily as needed for mild constipation. 14 each 0 Unknown at PRN  . pramipexole (MIRAPEX) 0.125 MG tablet Take 0.25 mg by mouth at bedtime.    72+ hours at Unknown  . pregabalin (LYRICA) 150 MG capsule Take 1 capsule (150 mg total) by mouth 3 (three) times daily. 90 capsule 5 72+ hours at Unknown  . QUEtiapine (SEROQUEL) 300 MG tablet TAKE ONE TABLET  BY MOUTH AT BEDTIME. (Patient taking differently: Take 300 mg by mouth at  bedtime. TAKE ONE TABLET BY MOUTH AT BEDTIME.) 28 tablet 11 72+ hours at Unknown  . VESICARE 5 MG tablet Take 5 mg by mouth daily.   72+ hours at Unknown  . zinc sulfate 220 (50 Zn) MG capsule Take 1 capsule (220 mg total) by mouth daily. 30 capsule 0 72+ hours at Unknown  . zolpidem (AMBIEN) 5 MG tablet Take 1 tablet (5 mg total) by mouth at bedtime. TAKE 1 TABLET BY MOUTH EVERYDAY AT BEDTIME (Patient taking differently: Take 5 mg by mouth at bedtime. ) 30 tablet 5 72+ hours at Unknown  . ascorbic acid (VITAMIN C) 500 MG tablet Take 1 tablet (500 mg total) by mouth daily. (Patient not taking: Reported on 07/11/2019) 30 tablet 0 Not Taking at Unknown time  . ciclesonide (ALVESCO) 160 MCG/ACT inhaler Inhale 4 puffs into the lungs 2 (two) times daily. (Patient not taking: Reported on 07/11/2019) 1 Inhaler 0 Not Taking at Unknown time  . dexamethasone (DECADRON) 1 MG tablet Take 2 mg daily for 3days,Take 1mg  daily for 3 days, Take 1mg  every other day for 3days,then stop (Patient not taking: Reported on 07/11/2019) 12 tablet 0 Not Taking at Unknown time  . ipratropium-albuterol (DUONEB) 0.5-2.5 (3) MG/3ML SOLN Take 3 mLs by nebulization 3 (three) times daily. (Patient not taking: Reported on 07/11/2019) 360 mL 0 Not Taking at Unknown time  . oxyCODONE (OXY IR/ROXICODONE) 5 MG immediate release tablet Take 1 tablet (5 mg total) by mouth every 6 (six) hours as needed for severe pain. Must last 30 days 120 tablet 0     Assessment: Patient is a 59 y/o F with a medical history including hypertension, type-2 diabetes, hypothyroidism, rheumatoid arthritis, diastolic heart failure, morbid obesity, hx of hospitalization in 03/2019 for ARDS secondary to COVID-19 disease who was admitted with septic shock secondary to MRSA bacteremia. Prior imaging concerning for possible splenic infarcts. Pharmacy has been consulted to initiate heparin drip for  splenic infarct.  3/27 0258 HL 0.29  3/27 1023 HL 0.25   Goal of Therapy:  Heparin level 0.3-0.7 units/ml Monitor platelets by anticoagulation protocol: Yes   Plan:  03/29 @ 0500 HL 0.51 therapeutic. Will continue current rate and will recheck HL w/ am labs.   Tobie Lords, PharmD, BCPS Clinical Pharmacist 07/16/2019,7:37 AM

## 2019-07-17 DIAGNOSIS — M05711 Rheumatoid arthritis with rheumatoid factor of right shoulder without organ or systems involvement: Secondary | ICD-10-CM

## 2019-07-17 DIAGNOSIS — E039 Hypothyroidism, unspecified: Secondary | ICD-10-CM

## 2019-07-17 DIAGNOSIS — M05712 Rheumatoid arthritis with rheumatoid factor of left shoulder without organ or systems involvement: Secondary | ICD-10-CM

## 2019-07-17 LAB — CBC WITH DIFFERENTIAL/PLATELET
Abs Immature Granulocytes: 0.07 10*3/uL (ref 0.00–0.07)
Basophils Absolute: 0 10*3/uL (ref 0.0–0.1)
Basophils Relative: 0 %
Eosinophils Absolute: 0 10*3/uL (ref 0.0–0.5)
Eosinophils Relative: 0 %
HCT: 29.4 % — ABNORMAL LOW (ref 36.0–46.0)
Hemoglobin: 8.6 g/dL — ABNORMAL LOW (ref 12.0–15.0)
Immature Granulocytes: 1 %
Lymphocytes Relative: 17 %
Lymphs Abs: 1.5 10*3/uL (ref 0.7–4.0)
MCH: 23.1 pg — ABNORMAL LOW (ref 26.0–34.0)
MCHC: 29.3 g/dL — ABNORMAL LOW (ref 30.0–36.0)
MCV: 79 fL — ABNORMAL LOW (ref 80.0–100.0)
Monocytes Absolute: 0.8 10*3/uL (ref 0.1–1.0)
Monocytes Relative: 9 %
Neutro Abs: 6.5 10*3/uL (ref 1.7–7.7)
Neutrophils Relative %: 73 %
Platelets: 359 10*3/uL (ref 150–400)
RBC: 3.72 MIL/uL — ABNORMAL LOW (ref 3.87–5.11)
RDW: 17 % — ABNORMAL HIGH (ref 11.5–15.5)
WBC: 8.9 10*3/uL (ref 4.0–10.5)
nRBC: 0 % (ref 0.0–0.2)

## 2019-07-17 LAB — MAGNESIUM: Magnesium: 2.1 mg/dL (ref 1.7–2.4)

## 2019-07-17 LAB — GLUCOSE, CAPILLARY
Glucose-Capillary: 132 mg/dL — ABNORMAL HIGH (ref 70–99)
Glucose-Capillary: 148 mg/dL — ABNORMAL HIGH (ref 70–99)
Glucose-Capillary: 165 mg/dL — ABNORMAL HIGH (ref 70–99)
Glucose-Capillary: 193 mg/dL — ABNORMAL HIGH (ref 70–99)
Glucose-Capillary: 246 mg/dL — ABNORMAL HIGH (ref 70–99)

## 2019-07-17 LAB — BASIC METABOLIC PANEL
Anion gap: 8 (ref 5–15)
BUN: 20 mg/dL (ref 6–20)
CO2: 26 mmol/L (ref 22–32)
Calcium: 8.5 mg/dL — ABNORMAL LOW (ref 8.9–10.3)
Chloride: 107 mmol/L (ref 98–111)
Creatinine, Ser: 0.66 mg/dL (ref 0.44–1.00)
GFR calc Af Amer: >60 mL/min (ref >60)
GFR calc non Af Amer: >60 mL/min (ref >60)
Glucose, Bld: 128 mg/dL — ABNORMAL HIGH (ref 70–99)
Potassium: 3.4 mmol/L — ABNORMAL LOW (ref 3.5–5.1)
Sodium: 141 mmol/L (ref 135–145)

## 2019-07-17 LAB — C-REACTIVE PROTEIN: CRP: 2.1 mg/dL — ABNORMAL HIGH (ref <1.0)

## 2019-07-17 LAB — PHOSPHORUS: Phosphorus: 3.3 mg/dL (ref 2.5–4.6)

## 2019-07-17 LAB — SEDIMENTATION RATE: Sed Rate: 65 mm/hr — ABNORMAL HIGH (ref 0–30)

## 2019-07-17 MED ORDER — VITAMIN D3 125 MCG (5000 UT) PO CAPS: 1.0000 | ORAL_CAPSULE | Freq: Every day | ORAL | 0 refills | Status: AC

## 2019-07-17 MED ORDER — INSULIN LISPRO 100 UNIT/ML ~~LOC~~ SOLN: 8.0000 [IU] | Freq: Three times a day (TID) | SUBCUTANEOUS | 11 refills | Status: DC

## 2019-07-17 MED ORDER — VANCOMYCIN IV (FOR PTA / DISCHARGE USE ONLY): 1750.0000 mg | INTRAVENOUS | 0 refills | Status: AC

## 2019-07-17 MED ORDER — APIXABAN 5 MG PO TABS: ORAL_TABLET | ORAL | 0 refills | Status: DC

## 2019-07-17 MED ORDER — ALPRAZOLAM 0.5 MG PO TABS: 0.5000 mg | ORAL_TABLET | Freq: Two times a day (BID) | ORAL | 0 refills | Status: DC | PRN

## 2019-07-17 MED ORDER — TRESIBA FLEXTOUCH 200 UNIT/ML ~~LOC~~ SOPN: 22.0000 [IU] | PEN_INJECTOR | Freq: Two times a day (BID) | SUBCUTANEOUS | Status: DC

## 2019-07-17 MED ORDER — POTASSIUM CHLORIDE CRYS ER 20 MEQ PO TBCR
40.0000 meq | EXTENDED_RELEASE_TABLET | Freq: Once | ORAL | Status: AC
  Administered 2019-07-17: 40 meq via ORAL
  Filled 2019-07-17: qty 2

## 2019-07-17 MED ORDER — PREDNISONE 10 MG PO TABS: ORAL_TABLET | ORAL | 0 refills | Status: DC

## 2019-07-17 MED ORDER — PREGABALIN 150 MG PO CAPS: 150.0000 mg | ORAL_CAPSULE | Freq: Every day | ORAL | 5 refills | Status: DC

## 2019-07-17 MED ORDER — PREDNISONE 10 MG TABLET
0.00000 days
Start: 2019-07-17 — End: 2019-10-26

## 2019-07-17 MED ORDER — APIXABAN 5 MG TABLET
0.00000 days
Start: 2019-07-17 — End: ?

## 2019-07-17 NOTE — TOC Transition Note (Signed)
Transition of Care [facility]) - CM/SW Discharge Note   Patient Details  Name: [name] [name] MRN: [id number] Date of Birth: December 14, 1960  [facility] CM/SW Contact:  Beverly Sessions, RN Phone Number: 07/17/2019, 12:17 PM   Clinical Narrative:    Patient to discharge today Patient has improved with PT and recommendations have been upgraded to home health PT  Patient states that her mother, aunt, and daughter will be rotating staying with her in the home. Patient does not feel like she needs PT services at home, and states that she has exercises that she can do at home  [name] with [facility] met with patient yesterday to provide education.  Patient states that she has had home IV antibiotics at home x5 and is familiar with the process.    Patient has already received today's dose of vanc.  [facility] has arranged nursing services start of care tomorrow at 8 am through [facility].   Family to transport at discharge.  To bring portable O2   Final next level of care: Home w Home Health Services Barriers to Discharge: No Barriers Identified   Patient Goals and CMS Choice        Discharge Placement                       Discharge Plan and Services                          HH Arranged: RN [facility] Agency: ([facility])        Social Determinants of Health (SDOH) Interventions     Readmission Risk Interventions Readmission Risk Prevention Plan 07/17/2019 07/09/2019 05/03/2019  Transportation Screening Complete Complete Complete  Medication Review [employer] photographer) Complete Complete Referral to Pharmacy  PCP or Specialist appointment within 3-5 days of discharge Complete Complete Complete  HRI or Home Care Consult Complete Complete Complete  Palliative Care Screening Not Applicable - Not [place] Not Applicable - Not Applicable  Some recent data might be hidden

## 2019-07-17 NOTE — Discharge Summary (Signed)
[name] NAME: [name] [name]    MR#:  [id number]  DATE OF BIRTH:  [birth year]  DATE OF ADMISSION:  07/07/2019 ADMITTING PHYSICIAN: Athena Masse, MD  DATE OF DISCHARGE: 07/17/2019  PRIMARY CARE PHYSICIAN: [facility], [facility]    ADMISSION DIAGNOSIS:  Pyelonephritis [N12] Atypical pneumonia [J18.9] Septic shock ([place]) [A41.9, R65.21] Severe sepsis ([place]) [A41.9, R65.20] History of COVID-19 [Z86.16]  DISCHARGE DIAGNOSIS:  Principal Problem:   Septic shock ([place]) Active Problems:   COPD (chronic obstructive pulmonary disease) ([place])   Essential (primary) hypertension   Chronic diastolic CHF (congestive heart failure) (HCC)   Anxiety and depression   Rheumatoid arthritis (HCC)   Chronic pain syndrome   Hypothyroidism   Sepsis due to methicillin resistant Staphylococcus aureus (MRSA) without acute organ dysfunction (HCC)   Type 2 diabetes mellitus without complication, without long-term current use of insulin (HCC)   Acute metabolic encephalopathy   Hyperglycemia due to type 2 diabetes mellitus ([place])   Morbid obesity with BMI of 50.0-59.9, adult ([place])   History of COVID-19   Severe sepsis with septic shock (HCC)   Ileus (HCC)   Splenic infarct   Right arm weakness   Bacteremia   Paroxysmal A-fib ([place])   SECONDARY DIAGNOSIS:   Past Medical History:  Diagnosis Date  . Abdominal wall hernia 01/29/2013  . Anxiety   . Arthritis    Rheumatoid  . C. difficile colitis   . Chronic diastolic heart failure ([place])   . COVID-19 03/23/2019   Diagnosed at [facility] (send-out) on 03/23/2019  . Depression   . Diabetes mellitus    states no meds or diet restrictions  at present  . Diastolic CHF ([place])   . Esophagitis   . Fluid retention   . GERD (gastroesophageal reflux disease)   . Hiatal hernia   . Hypertension   . Hypokalemia due to loss of potassium 10/21/2015   Overview:  Associated with 3 weeks of diarrhea  And QT prolongation.   . Hypothyroidism   . IBS (irritable bowel syndrome)   . Moderate episode of recurrent major depressive disorder ([place]) 06/03/2004  . Morbid obesity ([place])   . MRSA (methicillin resistant Staphylococcus aureus) infection 11/2017   left inner thigh abcess  . Neurogenic bladder    has pacemaker  . Neuropathy   . Obesity   . Panic attacks   . Pneumonia due to COVID-19 virus   . Rheumatoid arthritis ([place])   . Sleep apnea    STATES SEVERE, CANT TOLERATE MASK- LAST STUDY YEARS AGO    HOSPITAL COURSE:   1.  Septic shock present on admission.  MRSA growing out of the blood cultures.  Patient initially placed on triple antibiotics.  Infectious disease recommended IV vancomycin for total of 6 weeks with last dose being 08/20/2019.  Weekly labs to go to Dr. Steva Ready.  Patient will follow up with Dr. Steva Ready as outpatient.  TEE was negative for vegetation.  Patient has a PICC line in her right arm.  PICC line care as per protocol.  Patient will go home with home health for IV antibiotics.  ESR on discharge 65 CRP 2.1. 2.  Ileus this has resolved.  CT scan of the abdomen was negative.  Patient was advanced on diet and tolerated well.  Having bowel movements. 3.  Splenic infarct.  The patient was on heparin drip during the hospital course and developed ileus after that.  I started Eliquis and that will  be continued.  30-day free card for Eliquis given. 4.  Rheumatoid arthritis flare with bilateral shoulder pain.  I continued Solu-Medrol and give a prednisone taper.  May end up needing more Solu-Medrol or another agent as outpatient to control rheumatoid arthritis.  Holding methotrexate while giving antibiotics. 5.  Right arm weakness secondary to rheumatoid arthritis flare.  CT scan of the head was negative. 6.  Morbid obesity 7.  Hypothyroidism unspecified on levothyroxine 8.  Type 2 diabetes mellitus on detemir insulin here can go back on her usual insulin but on the dosing that we have here. 9.   Anxiety depression continue psychiatric medications 10.  Brief atrial fibrillation was converted over to normal sinus rhythm 11.  Weakness.  Physical therapy recommended rehab initially but she did much better with physical therapy on the day of discharge we recommended home with home health. 12.  Chronic anemia follow-up as outpatient  DISCHARGE CONDITIONS:   Satisfactory  CONSULTS OBTAINED:  Treatment Team:  Abbie Sons, MD Leim Fabry, MD  DRUG ALLERGIES:   Allergies  Allergen Reactions  . Cephalexin Hives  . Codeine Palpitations, Nausea Only, Nausea And Vomiting, Rash and Shortness Of Breath    "makes heart fly, she gets flushed and passes out"  . Doxycycline Rash  . Propoxyphene Rash and Shortness Of Breath    Increase heart rate  . Sulfa Antibiotics Palpitations, Nausea Only, Shortness Of Breath and Hives    "makes heart fly, she gets flushed and passes out"  . Lovenox [Enoxaparin Sodium] Hives  . Hydrocodone Nausea And Vomiting    Hear racing & breaks out into a cold sweat.  . Meropenem Rash    Erythematous, hot, pruritic rash over arms, chest, back, abdomen, and face occurred at the end of meropenem infusion on 02/22/18    DISCHARGE MEDICATIONS:   Allergies as of 07/17/2019      Reactions   Cephalexin Hives   Codeine Palpitations, Nausea Only, Nausea And Vomiting, Rash, Shortness Of Breath   "makes heart fly, she gets flushed and passes out"   Doxycycline Rash   Propoxyphene Rash, Shortness Of Breath   Increase heart rate   Sulfa Antibiotics Palpitations, Nausea Only, Shortness Of Breath, Hives   "makes heart fly, she gets flushed and passes out"   Lovenox [enoxaparin Sodium] Hives   Hydrocodone Nausea And Vomiting   Hear racing & breaks out into a cold sweat.   Meropenem Rash   Erythematous, hot, pruritic rash over arms, chest, back, abdomen, and face occurred at the end of meropenem infusion on 02/22/18      Medication List    STOP taking these  medications   Alvesco 160 MCG/ACT inhaler Generic drug: ciclesonide   atorvastatin 80 MG tablet Commonly known as: LIPITOR   benzonatate 100 MG capsule Commonly known as: Tessalon Perles   dexamethasone 1 MG tablet Commonly known as: DECADRON   furosemide 20 MG tablet Commonly known as: Lasix   linagliptin 5 MG Tabs tablet Commonly known as: TRADJENTA   methotrexate 2.5 MG tablet   VESIcare 5 MG tablet Generic drug: solifenacin   zinc sulfate 220 (50 Zn) MG capsule     TAKE these medications   ALPRAZolam 0.5 MG tablet Commonly known as: XANAX Take 1 tablet (0.5 mg total) by mouth 2 (two) times daily as needed for anxiety.   apixaban 5 MG Tabs tablet Commonly known as: ELIQUIS Two tabs po twcie a day for 6 days then one tablet twice a day  after that   ARIPiprazole 5 MG tablet Commonly known as: ABILIFY TAKE 1 TABLET BY MOUTH ONCE DAILY.   ascorbic acid 500 MG tablet Commonly known as: VITAMIN C Take 1 tablet (500 mg total) by mouth daily.   budesonide 0.5 MG/2ML nebulizer solution Commonly known as: PULMICORT Take 2 mLs (0.5 mg total) by nebulization 2 (two) times daily.   busPIRone 10 MG tablet Commonly known as: BUSPAR TAKE 1 TABLET BY MOUTH TWICE DAILY   Ensure Max Protein Liqd Take 330 mLs (11 oz total) by mouth 2 (two) times daily between meals.   famotidine 20 MG tablet Commonly known as: PEPCID Take 1 tablet (20 mg total) by mouth daily.   FLUoxetine 20 MG capsule Commonly known as: PROZAC Take 1 capsule (20 mg total) by mouth daily. TAKE (1) CAPSULE BY MOUTH ONCE DAILY. What changed: additional instructions   folic acid 1 MG tablet Commonly known as: FOLVITE Take 1 mg by mouth daily.   GNP Calcium Citrate+D Maximum 315-250 MG-UNIT Tabs Generic drug: Calcium Citrate-Vitamin D Take 1 tablet by mouth daily.   insulin lispro 100 UNIT/ML injection Commonly known as: HUMALOG Inject 0.08 mLs (8 Units total) into the skin 3 (three) times daily  before meals. What changed: how much to take   ipratropium-albuterol 0.5-2.5 (3) MG/3ML Soln Commonly known as: DUONEB Take 3 mLs by nebulization 3 (three) times daily.   levothyroxine 88 MCG tablet Commonly known as: SYNTHROID Take 88 mcg by mouth daily before breakfast.   oxyCODONE 5 MG immediate release tablet Commonly known as: Oxy IR/ROXICODONE Take 1 tablet (5 mg total) by mouth every 6 (six) hours as needed for severe pain. Must last 30 days   polyethylene glycol 17 g packet Commonly known as: MIRALAX / GLYCOLAX Take 17 g by mouth daily as needed for mild constipation.   pramipexole 0.125 MG tablet Commonly known as: MIRAPEX Take 0.25 mg by mouth at bedtime.   predniSONE 10 MG tablet Commonly known as: DELTASONE 3 tabs po day1; 2 tabs po day2; 1 tab po day3,4; 1/2 tab po day5,6,7,8   pregabalin 150 MG capsule Commonly known as: LYRICA Take 1 capsule (150 mg total) by mouth at bedtime. What changed: when to take this   QUEtiapine 300 MG tablet Commonly known as: SEROQUEL TAKE ONE TABLET BY MOUTH AT BEDTIME. What changed:   how much to take  how to take this  when to take this   Tresiba FlexTouch 200 UNIT/ML FlexTouch Pen Generic drug: insulin degludec Inject 22 Units into the skin in the morning and at bedtime. What changed:   how much to take  when to take this   vancomycin  IVPB Inject 1,750 mg into the vein daily. Indication:  MRSA Bactermia with splenic infarcts Last Day of Therapy:  08/20/2019 [facility] Care Per Protocol: Aim for Vancomycin Trough of 15-20 Labs weekly Monday while on IV antibiotics: _X_ CBC with differential _X_CMP _X__ Vancomycin trough  Labs every Thursday while on antibiotics X BMP X Vancomycin trough  _X_ Please pull PIC at completion of IV antibiotics _ Fax weekly labs to [facility] [phone] Clinic Follow Up Appt:4 weeks  Call [phone] to make appt   Vitamin D3 125 MCG (5000 UT) Caps Take 1 capsule  (5,000 Units total) by mouth daily with breakfast. Take along with calcium and magnesium.   zolpidem 5 MG tablet Commonly known as: AMBIEN Take 1 tablet (5 mg total) by mouth at bedtime. TAKE 1 TABLET BY MOUTH EVERYDAY  AT BEDTIME What changed: additional instructions            Home Infusion Instuctions  (From admission, onward)         Start     Ordered   07/17/19 0000  Home infusion instructions    Question:  Instructions  Answer:  Flushing of vascular access device: 0.9% NaCl pre/post medication administration and prn patency; Heparin 100 u/ml, 29m for implanted ports and Heparin 10u/ml, 557mfor all other central venous catheters.   07/17/19 1049           DISCHARGE INSTRUCTIONS:   Follow-up PMD 5 days Follow-up Dr. RaSteva Readynfectious disease as outpatient 2 weeks  If you experience worsening of your admission symptoms, develop shortness of breath, life threatening emergency, suicidal or homicidal thoughts you must seek medical attention immediately by calling 911 or calling your MD immediately  if symptoms less severe.  You Must read complete instructions/literature along with all the possible adverse reactions/side effects for all the Medicines you take and that have been prescribed to you. Take any new Medicines after you have completely understood and accept all the possible adverse reactions/side effects.   Please note  You were cared for by a hospitalist during your hospital stay. If you have any questions about your discharge medications or the care you received while you were in the hospital after you are discharged, you can call the unit and asked to speak with the hospitalist on call if the hospitalist that took care of you is not available. Once you are discharged, your primary care physician will handle any further medical issues. Please note that NO REFILLS for any discharge medications will be authorized once you are discharged, as it is imperative that you  return to your primary care physician (or establish a relationship with a primary care physician if you do not have one) for your aftercare needs so that they can reassess your need for medications and monitor your lab values.    Today   CHIEF COMPLAINT:   Chief Complaint  Patient presents with  . Shortness of Breath    HISTORY OF PRESENT ILLNESS:  [name] Wittsis a [age].o. female presented with shortness of breath   VITAL SIGNS:  Blood pressure (!) 143/69, pulse 69, temperature 98 F (36.7 C), temperature source Oral, resp. rate 16, height 5' 10"  (1.778 m), weight (!) 158.8 kg, last menstrual period 04/20/2001, SpO2 96 %.   PHYSICAL EXAMINATION:  GENERAL:  [age].o.-year-old patient lying in the bed with no acute distress.  EYES: Pupils equal, round, reactive to light and accommodation. No scleral icterus. HEENT: Head atraumatic, normocephalic.  NECK:  Supple, no jugular venous distention.  LUNGS: Normal breath sounds bilaterally, no wheezing, rales,rhonchi or crepitation.  CARDIOVASCULAR: S1, S2 normal. No murmurs, rubs, or gallops.  ABDOMEN: Soft, non-tender, non-distended. Bowel sounds present. No organomegaly or mass.  EXTREMITIES: No pedal edema, cyanosis, or clubbing.  NEUROLOGIC: Cranial nerves II through XII are intact. Muscle strength 5/5 in all extremities. Sensation intact.  Patient seen walking with walker around the nursing station with the physical therapist PSYCHIATRIC: The patient is alert and oriented x 3.  SKIN: No obvious rash, lesion, or ulcer.   DATA REVIEW:   CBC Recent Labs  Lab 07/17/19 0500  WBC 8.9  HGB 8.6*  HCT 29.4*  PLT 359    Chemistries  Recent Labs  Lab 07/13/19 2326 07/14/19 0505 07/17/19 0500  NA 141   < > 141  K 3.9   < > 3.4*  CL 108   < > 107  CO2 21*   < > 26  GLUCOSE 209*   < > 128*  BUN 21*   < > 20  CREATININE 0.54   < > 0.66  CALCIUM 8.4*   < > 8.5*  MG 2.0   < > 2.1  AST 18  --   --   ALT 29  --   --    ALKPHOS 70  --   --   BILITOT 0.7  --   --    < > = values in this interval not displayed.     Microbiology Results  Results for orders placed or performed during the hospital encounter of 07/07/19  Urine culture     Status: None   Collection Time: 07/07/19 12:14 AM   Specimen: In/Out Cath Urine  Result Value Ref Range Status   Specimen Description   Final    IN/OUT CATH URINE Performed at [facility], [address], [place], [place] [address]    Special Requests   Final    NONE Performed at [facility], [address], [place], [place] [address]    Culture   Final    NO GROWTH Performed at [facility], [place] [address], [place], [place] [address]    Report Status 07/09/2019 FINAL  Final  Blood Culture (routine x 2)     Status: Abnormal   Collection Time: 07/08/19 12:14 AM   Specimen: BLOOD  Result Value Ref Range Status   Specimen Description   Final    BLOOD LEFT ANTECUBITAL Performed at [facility], [address] [place], [place] [address]    Special Requests   Final    BOTTLES DRAWN AEROBIC AND ANAEROBIC Blood Culture adequate volume Performed at [facility], [address] [place], [place] [address]    Culture  Setup Time   Final    GRAM POSITIVE COCCI IN BOTH AEROBIC AND ANAEROBIC BOTTLES CRITICAL RESULT CALLED TO, READ BACK BY AND VERIFIED WITH: ABBY ELLINGTON AT 3614 ON 07/08/19 SNG Performed at [facility], [place] [address] [place], [place] [address]    Culture METHICILLIN RESISTANT STAPHYLOCOCCUS AUREUS (A)  Final   Report Status 07/10/2019 FINAL  Final   Organism ID, Bacteria METHICILLIN RESISTANT STAPHYLOCOCCUS AUREUS  Final      Susceptibility   Methicillin resistant staphylococcus aureus - MIC*    CIPROFLOXACIN >=8 RESISTANT Resistant     ERYTHROMYCIN >=8 RESISTANT Resistant     GENTAMICIN <=0.5 SENSITIVE Sensitive     OXACILLIN >=4 RESISTANT Resistant     TETRACYCLINE <=1 SENSITIVE Sensitive      VANCOMYCIN 1 SENSITIVE Sensitive     TRIMETH/SULFA <=10 SENSITIVE Sensitive     CLINDAMYCIN >=8 RESISTANT Resistant     RIFAMPIN <=0.5 SENSITIVE Sensitive     Inducible Clindamycin NEGATIVE Sensitive     * METHICILLIN RESISTANT STAPHYLOCOCCUS AUREUS  Blood Culture (routine x 2)     Status: Abnormal   Collection Time: 07/08/19 12:14 AM   Specimen: BLOOD  Result Value Ref Range Status   Specimen Description   Final    BLOOD RIGHT ANTECUBITAL Performed at [facility], [address] [place],  [address]    Special Requests   Final    BOTTLES DRAWN AEROBIC AND ANAEROBIC Blood Culture results may not be optimal due to an excessive volume of blood received in culture bottles  Performed at [facility], [address] [place], [place] [address]    Culture  Setup Time   Final    GRAM POSITIVE COCCI IN BOTH AEROBIC AND ANAEROBIC BOTTLES CRITICAL VALUE NOTED.  VALUE IS CONSISTENT WITH PREVIOUSLY REPORTED AND CALLED VALUE. Performed at [facility], [address] [place], [place] [address]    Culture (A)  Final    STAPHYLOCOCCUS AUREUS SUSCEPTIBILITIES PERFORMED ON PREVIOUS CULTURE WITHIN THE LAST 5 DAYS. Performed at [facility], [place] [address], [place], [place] [address]    Report Status 07/10/2019 FINAL  Final  Blood Culture ID Panel (Reflexed)     Status: Abnormal   Collection Time: 07/08/19 12:14 AM  Result Value Ref Range Status   Enterococcus species NOT DETECTED NOT DETECTED Final   Listeria monocytogenes NOT DETECTED NOT DETECTED Final   Staphylococcus species DETECTED (A) NOT DETECTED Final    Comment: CRITICAL RESULT CALLED TO, READ BACK BY AND VERIFIED WITH: CHARLES SHANLEVER AT 6203 ON 07/08/19 SNG    Staphylococcus aureus (BCID) DETECTED (A) NOT DETECTED Final    Comment: Methicillin (oxacillin)-resistant Staphylococcus aureus (MRSA). MRSA is predictably resistant to beta-lactam antibiotics (except ceftaroline). Preferred  therapy is vancomycin unless clinically contraindicated. Patient requires contact precautions if  hospitalized. CRITICAL RESULT CALLED TO, READ BACK BY AND VERIFIED WITH: CHARLES SHANLEVER AT 5597 ON 07/08/19 SNG    Methicillin resistance DETECTED (A) NOT DETECTED Final    Comment: CRITICAL RESULT CALLED TO, READ BACK BY AND VERIFIED WITH: CHARLES SHANLEVER AT 4163 ON 07/08/19 SNG    Streptococcus species NOT DETECTED NOT DETECTED Final   Streptococcus agalactiae NOT DETECTED NOT DETECTED Final   Streptococcus pneumoniae NOT DETECTED NOT DETECTED Final   Streptococcus pyogenes NOT DETECTED NOT DETECTED Final   Acinetobacter baumannii NOT DETECTED NOT DETECTED Final   Enterobacteriaceae species NOT DETECTED NOT DETECTED Final   Enterobacter cloacae complex NOT DETECTED NOT DETECTED Final   Escherichia coli NOT DETECTED NOT DETECTED Final   Klebsiella oxytoca NOT DETECTED NOT DETECTED Final   Klebsiella pneumoniae NOT DETECTED NOT DETECTED Final   Proteus species NOT DETECTED NOT DETECTED Final   Serratia marcescens NOT DETECTED NOT DETECTED Final   Haemophilus influenzae NOT DETECTED NOT DETECTED Final   Neisseria meningitidis NOT DETECTED NOT DETECTED Final   Pseudomonas aeruginosa NOT DETECTED NOT DETECTED Final   Candida albicans NOT DETECTED NOT DETECTED Final   Candida glabrata NOT DETECTED NOT DETECTED Final   Candida krusei NOT DETECTED NOT DETECTED Final   Candida parapsilosis NOT DETECTED NOT DETECTED Final   Candida tropicalis NOT DETECTED NOT DETECTED Final    Comment: Performed at [facility], [address] [place], [place] [address]  Respiratory Panel by RT PCR (Flu A&B, Covid) - Nasopharyngeal Swab     Status: None   Collection Time: 07/08/19  1:18 AM   Specimen: Nasopharyngeal Swab  Result Value Ref Range Status   SARS Coronavirus 2 by RT PCR NEGATIVE NEGATIVE Final    Comment: (NOTE) SARS-CoV-2 target nucleic acids are NOT DETECTED. The SARS-CoV-2 RNA is  generally detectable in upper respiratoy specimens during the acute phase of infection. The lowest concentration of SARS-CoV-2 viral copies this assay can detect is 131 copies/mL. A negative result does not preclude SARS-Cov-2 infection and should not be used as the sole basis for treatment or other patient management decisions. A negative result may occur with  improper specimen collection/handling, submission of specimen other than nasopharyngeal swab, presence of  viral mutation(s) within the areas targeted by this assay, and inadequate number of viral copies (<131 copies/mL). A negative result must be combined with clinical observations, patient history, and epidemiological information. The expected result is Negative. Fact Sheet for Patients:  PinkCheek.be Fact Sheet for Healthcare Providers:  GravelBags.it This test is not yet ap proved or cleared by the [place] FDA and  has been authorized for detection and/or diagnosis of SARS-CoV-2 by FDA under an Emergency Use Authorization (EUA). This EUA will remain  in effect (meaning this test can be used) for the duration of the COVID-19 declaration under Section 564(b)(1) of the Act, 21 U.S.C. section 360bbb-3(b)(1), unless the authorization is terminated or revoked sooner.    Influenza A by PCR NEGATIVE NEGATIVE Final   Influenza B by PCR NEGATIVE NEGATIVE Final    Comment: (NOTE) The Xpert Xpress SARS-CoV-2/FLU/RSV assay is intended as an aid in  the diagnosis of influenza from Nasopharyngeal swab specimens and  should not be used as a sole basis for treatment. Nasal washings and  aspirates are unacceptable for Xpert Xpress SARS-CoV-2/FLU/RSV  testing. Fact Sheet for Patients: PinkCheek.be Fact Sheet for Healthcare Providers: GravelBags.it This test is not yet approved or cleared by the [place] FDA and   has been authorized for detection and/or diagnosis of SARS-CoV-2 by  FDA under an Emergency Use Authorization (EUA). This EUA will remain  in effect (meaning this test can be used) for the duration of the  Covid-19 declaration under Section 564(b)(1) of the Act, 21  U.S.C. section 360bbb-3(b)(1), unless the authorization is  terminated or revoked. Performed at [facility], [address] [place], [place] [address]   MRSA PCR Screening     Status: Abnormal   Collection Time: 07/08/19  7:51 AM   Specimen: Nasal Mucosa; Nasopharyngeal  Result Value Ref Range Status   MRSA by PCR POSITIVE (A) NEGATIVE Final    Comment:        The GeneXpert MRSA Assay (FDA approved for NASAL specimens only), is one component of a comprehensive MRSA colonization surveillance program. It is not intended to diagnose MRSA infection nor to guide or monitor treatment for MRSA infections. RESULT CALLED TO, READ BACK BY AND VERIFIED WITH: VICK GREGORY AT 9935 07/08/19.PMF Performed at [facility], [address] [place], [place] [address]   Culture, blood (Routine X 2) w Reflex to ID Panel     Status: None   Collection Time: 07/09/19  2:02 PM   Specimen: BLOOD  Result Value Ref Range Status   Specimen Description BLOOD BLOOD RIGHT HAND  Final   Special Requests   Final    BOTTLES DRAWN AEROBIC AND ANAEROBIC Blood Culture adequate volume   Culture   Final    NO GROWTH 5 DAYS Performed at [facility], [address], [name], [place] [address]    Report Status 07/14/2019 FINAL  Final  Culture, blood (Routine X 2) w Reflex to ID Panel     Status: None   Collection Time: 07/09/19  3:57 PM   Specimen: BLOOD  Result Value Ref Range Status   Specimen Description BLOOD BLOOD RIGHT ARM  Final   Special Requests   Final    BOTTLES DRAWN AEROBIC ONLY Blood Culture adequate volume   Culture   Final    NO GROWTH 5 DAYS Performed at [facility], [address] [place] [address], [place], [place] [address]    Report Status 07/14/2019 FINAL  Final  RADIOLOGY:  ECHO TEE  Result Date: 07/16/2019    TRANSESOPHOGEAL ECHO REPORT   Patient Name:   KHYLEIGH FURNEY Date of Exam: 07/16/2019 Medical Rec #:  [id number]       Height:       70.0 in Accession #:    [id number]      Weight:       350.0 lb Date of Birth:  01/05/1961       BSA:          2.648 m Patient Age:    7 years        BP:           Not listed in chart/Not listed in                                               chart mmHg Patient Gender: F               HR:           68 bpm. Exam Location:  [facility] Procedure: Transesophageal Echo, Cardiac Doppler and Color Doppler Indications:     Bacteremia 790.7  History:         Patient has prior history of Echocardiogram examinations, most                  recent 07/08/2019. Risk Factors:Diabetes. Diastloic CHF.  Sonographer:     Sherrie Sport RDCS (AE) Referring Phys:  [id number] [place] Diagnosing Phys: Kate Sable MD PROCEDURE: The transesophogeal probe was passed without difficulty through the esophogus of the patient. Sedation performed by different physician. The patient developed no complications during the procedure. IMPRESSIONS  1. Left ventricular ejection fraction, by estimation, is 60 to 65%. The left ventricle has normal function. The left ventricle has no regional wall motion abnormalities. Left ventricular diastolic function could not be evaluated.  2. Right ventricular systolic function is normal. The right ventricular size is normal.  3. No left atrial/left atrial appendage thrombus was detected. The LAA emptying velocity was 80 cm/s.  4. The mitral valve is normal in structure. Trivial mitral valve regurgitation.  5. The aortic valve is tricuspid. Aortic valve regurgitation is not visualized. Conclusion(s)/Recommendation(s): No evidence of vegetation/infective endocarditis on this transesophageal echocardiogram. FINDINGS  Left Ventricle: Left ventricular ejection  fraction, by estimation, is 60 to 65%. The left ventricle has normal function. The left ventricle has no regional wall motion abnormalities. The left ventricular internal cavity size was normal in size. There is  no left ventricular hypertrophy. Left ventricular diastolic function could not be evaluated. Right Ventricle: The right ventricular size is normal. No increase in right ventricular wall thickness. Right ventricular systolic function is normal. Left Atrium: Left atrial size was normal in size. No left atrial/left atrial appendage thrombus was detected. The LAA emptying velocity was 80 cm/s. Right Atrium: Right atrial size was normal in size. Pericardium: There is no evidence of pericardial effusion. Mitral Valve: The mitral valve is normal in structure. Trivial mitral valve regurgitation. Tricuspid Valve: The tricuspid valve is normal in structure. Tricuspid valve regurgitation is not demonstrated. Aortic Valve: The aortic valve is tricuspid. Aortic valve regurgitation is not visualized. Pulmonic Valve: The pulmonic valve was normal in structure. Pulmonic valve regurgitation is not visualized. Aorta: The aortic root is normal in size and structure. Venous: The inferior vena cava was  not well visualized. IAS/Shunts: No atrial level shunt detected by color flow Doppler. Kate Sable MD Electronically signed by Kate Sable MD Signature Date/Time: 07/16/2019/1:11:51 PM    Final    [name] EKG SITE RITE  Result Date: 07/16/2019 If Site Rite image not attached, placement could not be confirmed due to current cardiac rhythm.     Management plans discussed with the patient, and she is in agreement.  I left a message for the patient's mother on the phone.  I did speak with the patient's mother yesterday about potential discharge.  CODE STATUS:     Code Status Orders  (From admission, onward)         Start     Ordered   07/08/19 0337  Full code  Continuous     07/08/19 0340        Code  Status History    Date Active Date Inactive Code Status Order ID Comments User Context   04/07/2019 0552 05/04/2019 2052 Full Code [id number]  Ivor Costa, MD ED   03/27/2019 1419 03/31/2019 1608 Full Code [id number]  Tawni Millers, MD Inpatient   03/27/2019 0446 03/27/2019 1402 Full Code [id number]  Mansy, Arvella Merles, MD ED   Advance Care Planning Activity      TOTAL TIME TAKING CARE OF THIS PATIENT: 35 minutes.    Loletha Grayer M.D on 07/17/2019 at 3:40 PM  Between 7am to 6pm - Pager - [phone]  After 6pm go to [email or web address] - password [email or web address]  [facility]  CC: Primary care physician; Care, [facility]

## 2019-07-17 NOTE — Progress Notes (Signed)
PHARMACY CONSULT NOTE  Pharmacy Consult for Electrolyte Monitoring and Replacement   Recent Labs: Potassium (mmol/L)  Date Value  07/17/2019 3.4 (L)  05/27/2014 3.8   Magnesium (mg/dL)  Date Value  86/76/7209 2.1  12/02/2013 1.1 (L)   Calcium (mg/dL)  Date Value  47/12/6281 8.5 (L)   Calcium, Total (mg/dL)  Date Value  66/29/4765 8.5   Albumin (g/dL)  Date Value  46/50/3546 2.3 (L)  11/16/2018 4.2  12/03/2013 2.7 (L)   Phosphorus (mg/dL)  Date Value  56/81/2751 3.3   Sodium (mmol/L)  Date Value  07/17/2019 141  11/16/2018 134  05/27/2014 136   Corrected Ca: 9.86 mg/dL  Assessment: 59 year old female admitted with difficulty breathing. Patient recently hospitalized 04/06/19 to 05/04/19 post-COVID. Subsequently MRSA bacteremia was discovered and she is being treated with IV vancomycin. Pharmacy was consulted for management of electrolytes and glucose control.  Goal of Therapy:  Electrolytes WNL Glucose < 180 (ideally)  Plan:  Electrolytes:   Will order KCL PO x 1 dose.  Follow-up AM labs tomorrow   Glucose (3/21 Hgb A1C 11.6%):   Blood glucoses: 123 - 183  Stress dose steroids d/c 3/25, now on methylprednisolone 40 mg IV daily (day 5)  Continue levemir at 22 units BID   Continue SS  On Tresiba and linagliptin PTA   Bettey Costa, PharmD Clinical Pharmacist 07/17/2019 8:10 AM

## 2019-07-17 NOTE — Discharge Instructions (Signed)
Sepsis, Diagnosis, Adult Sepsis is a serious bodily reaction to an infection. The infection that triggers sepsis may be from a bacteria, virus, or fungus. Sepsis can result from an infection in any part of your body. Infections that commonly lead to sepsis include skin, lung, and urinary tract infections. Sepsis is a medical emergency that must be treated right away in a hospital. In severe cases, it can lead to septic shock. Septic shock can weaken your heart and cause your blood pressure to drop. This can cause your central nervous system and your body's organs to stop working. What are the causes? This condition is caused by a severe reaction to infections from bacteria, viruses, or fungus. The germs that most often lead to sepsis include:  Escherichia coli (E. coli) bacteria.  Staphylococcus aureus (staph) bacteria.  Some types of Streptococcus bacteria. The most common infections affect these organs:  The lung (pneumonia).  The kidneys or bladder (urinary tract infection).  The skin (cellulitis).  The bowel, gallbladder, or pancreas. What increases the risk? You are more likely to develop this condition if:  Your body's disease-fighting system (immune system) is weakened.  You are age 65 or older.  You are female.  You had surgery or you have been hospitalized.  You have these devices inserted into your body: ? A small, thin tube (catheter). ? IV line. ? Breathing tube. ? Drainage tube.  You are not getting enough nutrients from food (malnourished).  You have a long-term (chronic) disease, such as cancer, lung disease, kidney disease, or diabetes.  You are African American. What are the signs or symptoms? Symptoms of this condition may include:  Fever.  Chills or feeling very cold.  Confusion or anxiety.  Fatigue.  Muscle aches.  Shortness of breath.  Nausea and vomiting.  Urinating much less than usual.  Fast heart rate (tachycardia).  Rapid  breathing (hyperventilation).  Changes in skin color. Your skin may look blotchy, pale, or blue.  Cool, clammy, or sweaty skin.  Skin rash. Other symptoms depend on the source of your infection. How is this diagnosed? This condition is diagnosed based on:  Your symptoms.  Your medical history.  A physical exam. Other tests may also be done to find out the cause of the infection and how severe the sepsis is. These tests may include:  Blood tests.  Urine tests.  Swabs from other areas of your body that may have an infection. These samples may be tested (cultured) to find out what type of bacteria is causing the infection.  Chest X-ray to check for pneumonia. Other imaging tests, such as a CT scan, may also be done.  Lumbar puncture. This removes a small amount of the fluid that surrounds your brain and spinal cord. The fluid is then examined for infection. How is this treated? This condition must be treated in a hospital. Based on the cause of your infection, you may be given an antibiotic, antiviral, or antifungal medicine. You may also receive:  Fluids through an IV.  Oxygen and breathing assistance.  Medicines to increase your blood pressure.  Kidney dialysis. This process cleans your blood if your kidneys have failed.  Surgery to remove infected tissue.  Blood transfusion if needed.  Medicine to prevent blood clots.  Nutrients to correct imbalances in basic body function (metabolism). You may: ? Receive important salts and minerals (electrolytes) through an IV. ? Have your blood sugar level adjusted. Follow these instructions at home: Medicines   Take over-the-counter and   prescription medicines only as told by your health care provider.  If you were prescribed an antibiotic, antiviral, or antifungal medicine, take it as told by your health care provider. Do not stop taking the medicine even if you start to feel better. General instructions  If you have a  catheter or other indwelling device, ask to have it removed as soon as possible.  Keep all follow-up visits as told by your health care provider. This is important. Contact a health care provider if:  You do not feel like you are getting better or regaining strength.  You are having trouble coping with your recovery.  You frequently feel tired.  You feel worse or do not seem to get better after surgery.  You think you may have an infection after surgery. Get help right away if:  You have any symptoms of sepsis.  You have difficulty breathing.  You have a rapid or skipping heartbeat.  You become confused or disoriented.  You have a high fever.  Your skin becomes blotchy, pale, or blue.  You have an infection that is getting worse or not getting better. These symptoms may represent a serious problem that is an emergency. Do not wait to see if the symptoms will go away. Get medical help right away. Call your local emergency services (911 in the [place] Do not drive yourself to the hospital. Summary  Sepsis is a medical emergency that requires immediate treatment in a hospital.  This condition is caused by a severe reaction to infections from bacteria, viruses, or fungus.  Based on the cause of your infection, you may be given an antibiotic, antiviral, or antifungal medicine.  Treatment may also include IV fluids, breathing assistance, and kidney dialysis. This information is not intended to replace advice given to you by your health care provider. Make sure you discuss any questions you have with your health care provider. Document Revised: 11/11/2017 Document Reviewed: 11/11/2017 Elsevier Patient Education  2020 [employer]  

## 2019-07-17 NOTE — Progress Notes (Signed)
Physical Therapy Treatment Patient Details Name: [name] [name] MRN: [id number] DOB: 11/29/1960 Today's Date: 07/17/2019    History of Present Illness Pt admitted for septic shock secondary to MRSA bacteremia. History includes covid 28 with ARDS in Dec 2020 and since has been on 2L of O2 at baseline. Other PMH includes RA    PT Comments    Pt is making improved progress towards goals with ability to ambulate around RN station. Chair follow provided but not needed. Good endurance with there-ex. All mobility on 2L of O2 with sats WNL. Pt still ambulates with slow speed, however much improved from previous session. No dizziness noted with negative orthostatics. Pt safe to dc home, recommendations updated. MD notified.  Orthostatics: supine: 159/85; seated: 151/99; standing: 161/90    Follow Up Recommendations  Home health PT     Equipment Recommendations  None recommended by PT    Recommendations for Other Services       Precautions / Restrictions Precautions Precautions: Fall Restrictions Weight Bearing Restrictions: No    Mobility  Bed Mobility Overal bed mobility: Needs Assistance Bed Mobility: Supine to Sit;Sit to Supine     Supine to sit: Min guard Sit to supine: Min guard   General bed mobility comments: safe technique with ease of mobility. Once seated at EOB, able to sit with upright posture. All mobility performed on 2L of O2.   Transfers Overall transfer level: Needs assistance Equipment used: Rolling walker (2 wheeled) Transfers: Sit to/from Stand Sit to Stand: Min guard         General transfer comment: safe technique with upright posture and BRW.  Ambulation/Gait Ambulation/Gait assistance: Min guard Gait Distance (Feet): 200 Feet Assistive device: Rolling walker (2 wheeled) Gait Pattern/deviations: Step-through pattern     General Gait Details: very slow gait pattern, however no fatigue and no seated rest break needed. CHair follow for safety.  O2 sats at 91% post exertion   Stairs             Wheelchair Mobility    Modified Rankin (Stroke Patients Only)       Balance Overall balance assessment: Needs assistance Sitting-balance support: Bilateral upper extremity supported;Feet supported Sitting balance-Leahy Scale: Good     Standing balance support: Bilateral upper extremity supported Standing balance-Leahy Scale: Fair                              Cognition Arousal/Alertness: Awake/alert Behavior During Therapy: WFL for tasks assessed/performed Overall Cognitive Status: Within Functional Limits for tasks assessed                                        Exercises Other Exercises Other Exercises: supine ther-ex performed on B LE including SLRs, hip abd/add, quad sets, and AP. All ther-ex performed with supervision x 10 reps.    General Comments        Pertinent Vitals/Pain Pain Assessment: No/denies pain    Home Living                      Prior Function            PT Goals (current goals can now be found in the care plan section) Acute Rehab PT Goals Patient Stated Goal: to be able to walk again PT Goal Formulation: With patient Time For Goal Achievement:  07/27/19 Potential to Achieve Goals: Good Progress towards PT goals: Progressing toward goals    Frequency    Min 2X/week      PT Plan Current plan remains appropriate    Co-evaluation              AM-PAC PT "6 Clicks" Mobility   Outcome Measure  Help needed turning from your back to your side while in a flat bed without using bedrails?: None Help needed moving from lying on your back to sitting on the side of a flat bed without using bedrails?: None Help needed moving to and from a bed to a chair (including a wheelchair)?: None Help needed standing up from a chair using your arms (e.g., wheelchair or bedside chair)?: None Help needed to walk in hospital room?: None Help needed climbing  3-5 steps with a railing? : A Little 6 Click Score: 23    End of Session Equipment Utilized During Treatment: Oxygen;Gait belt Activity Tolerance: Patient tolerated treatment well Patient left: in bed;with bed alarm set Nurse Communication: Mobility status PT Visit Diagnosis: Muscle weakness (generalized) (M62.81);Difficulty in walking, not elsewhere classified (R26.2);Unsteadiness on feet (R26.81)     Time: 4696-2952 PT Time Calculation (min) (ACUTE ONLY): 38 min  Charges:  $Gait Training: 23-37 mins $Therapeutic Exercise: 8-22 mins                     Greggory Stallion, PT, DPT [phone]    [name] [name] 07/17/2019, 12:01 PM

## 2019-07-17 NOTE — Plan of Care (Signed)
Patietn discharged to home with family who brought home oxygen.  PIV d/c. Medications and discharge instructions given to mom and patient who verbalized understanding.

## 2019-07-17 NOTE — Consult Note (Signed)
PHARMACY CONSULT NOTE FOR:  OUTPATIENT  PARENTERAL ANTIBIOTIC THERAPY (OPAT)  Indication: MRSA Bacteremia with Splenic Infarct Regimen: Vancomycin 1750mg  every 24 hours, aim for Vancomycin Trough 15-20 End date: 08/20/2019  IV antibiotic discharge orders are pended. To discharging provider:  please sign these orders via discharge navigator,  Select New Orders & click on the button choice - Manage This Unsigned Work.     Thank you for allowing pharmacy to be a part of this patient's care.  Micai Apolinar A Dafna Romo 07/17/2019, 8:24 AM

## 2019-07-19 ENCOUNTER — Other Ambulatory Visit
Admission: RE | Admit: 2019-07-19 | Discharge: 2019-07-19 | Disposition: A | Discharge status: Home / Self Care | Payer: Medicaid Other | Source: Ambulatory Visit | Attending: Infectious Diseases | Admitting: Infectious Diseases

## 2019-07-19 DIAGNOSIS — R7881 Bacteremia: Secondary | ICD-10-CM | POA: Insufficient documentation

## 2019-07-19 DIAGNOSIS — R6521 Severe sepsis with septic shock: Secondary | ICD-10-CM | POA: Insufficient documentation

## 2019-07-19 LAB — BASIC METABOLIC PANEL
Anion gap: 9 (ref 5–15)
BUN: 16 mg/dL (ref 6–20)
CO2: 25 mmol/L (ref 22–32)
Calcium: 8 mg/dL — ABNORMAL LOW (ref 8.9–10.3)
Chloride: 104 mmol/L (ref 98–111)
Creatinine, Ser: 0.61 mg/dL (ref 0.44–1.00)
GFR calc Af Amer: >60 mL/min (ref >60)
GFR calc non Af Amer: >60 mL/min (ref >60)
Glucose, Bld: 287 mg/dL — ABNORMAL HIGH (ref 70–99)
Potassium: 3.8 mmol/L (ref 3.5–5.1)
Sodium: 138 mmol/L (ref 135–145)

## 2019-07-19 LAB — VANCOMYCIN, TROUGH: Vancomycin Tr: 8 ug/mL — ABNORMAL LOW (ref 15–20)

## 2019-07-23 ENCOUNTER — Other Ambulatory Visit
Admission: RE | Admit: 2019-07-23 | Discharge: 2019-07-23 | Disposition: A | Discharge status: Home / Self Care | Payer: Medicaid Other | Source: Ambulatory Visit | Attending: Infectious Diseases | Admitting: Infectious Diseases

## 2019-07-23 DIAGNOSIS — R7881 Bacteremia: Secondary | ICD-10-CM | POA: Insufficient documentation

## 2019-07-23 DIAGNOSIS — R6521 Severe sepsis with septic shock: Secondary | ICD-10-CM | POA: Insufficient documentation

## 2019-07-23 LAB — CBC WITH DIFFERENTIAL/PLATELET
Abs Immature Granulocytes: 0.08 10*3/uL — ABNORMAL HIGH (ref 0.00–0.07)
Basophils Absolute: 0 10*3/uL (ref 0.0–0.1)
Basophils Relative: 0 %
Eosinophils Absolute: 0 10*3/uL (ref 0.0–0.5)
Eosinophils Relative: 0 %
HCT: 29.4 % — ABNORMAL LOW (ref 36.0–46.0)
Hemoglobin: 8.7 g/dL — ABNORMAL LOW (ref 12.0–15.0)
Immature Granulocytes: 1 %
Lymphocytes Relative: 7 %
Lymphs Abs: 0.7 10*3/uL (ref 0.7–4.0)
MCH: 23.1 pg — ABNORMAL LOW (ref 26.0–34.0)
MCHC: 29.6 g/dL — ABNORMAL LOW (ref 30.0–36.0)
MCV: 78.2 fL — ABNORMAL LOW (ref 80.0–100.0)
Monocytes Absolute: 0.4 10*3/uL (ref 0.1–1.0)
Monocytes Relative: 5 %
Neutro Abs: 7.7 10*3/uL (ref 1.7–7.7)
Neutrophils Relative %: 87 %
Platelets: 311 10*3/uL (ref 150–400)
RBC: 3.76 MIL/uL — ABNORMAL LOW (ref 3.87–5.11)
RDW: 17.1 % — ABNORMAL HIGH (ref 11.5–15.5)
WBC: 8.9 10*3/uL (ref 4.0–10.5)
nRBC: 0 % (ref 0.0–0.2)

## 2019-07-23 LAB — COMPREHENSIVE METABOLIC PANEL
ALT: 11 U/L (ref 0–44)
AST: 19 U/L (ref 15–41)
Albumin: 2.3 g/dL — ABNORMAL LOW (ref 3.5–5.0)
Alkaline Phosphatase: 56 U/L (ref 38–126)
Anion gap: 12 (ref 5–15)
BUN: 13 mg/dL (ref 6–20)
CO2: 24 mmol/L (ref 22–32)
Calcium: 7.9 mg/dL — ABNORMAL LOW (ref 8.9–10.3)
Chloride: 99 mmol/L (ref 98–111)
Creatinine, Ser: 1.03 mg/dL — ABNORMAL HIGH (ref 0.44–1.00)
GFR calc Af Amer: >60 mL/min (ref >60)
GFR calc non Af Amer: >60 mL/min (ref >60)
Glucose, Bld: 598 mg/dL (ref 70–99)
Potassium: 4.1 mmol/L (ref 3.5–5.1)
Sodium: 135 mmol/L (ref 135–145)
Total Bilirubin: 0.4 mg/dL (ref 0.3–1.2)
Total Protein: 5.6 g/dL — ABNORMAL LOW (ref 6.5–8.1)

## 2019-07-23 LAB — VANCOMYCIN, TROUGH: Vancomycin Tr: 8 ug/mL — ABNORMAL LOW (ref 15–20)

## 2019-07-24 MED ORDER — PRAMIPEXOLE 0.125 MG TABLET
ORAL_TABLET | Freq: Every evening | ORAL | 1 refills | 90.00000 days | Status: CP
Start: 2019-07-24 — End: 2020-07-23

## 2019-07-26 ENCOUNTER — Other Ambulatory Visit
Admission: RE | Admit: 2019-07-26 | Discharge: 2019-07-26 | Disposition: A | Discharge status: Home / Self Care | Payer: Medicaid Other | Source: Ambulatory Visit | Attending: Infectious Diseases | Admitting: Infectious Diseases

## 2019-07-26 DIAGNOSIS — R6521 Severe sepsis with septic shock: Secondary | ICD-10-CM | POA: Diagnosis present

## 2019-07-26 DIAGNOSIS — R7881 Bacteremia: Secondary | ICD-10-CM | POA: Insufficient documentation

## 2019-07-26 LAB — BASIC METABOLIC PANEL
Anion gap: 9 (ref 5–15)
BUN: 13 mg/dL (ref 6–20)
CO2: 28 mmol/L (ref 22–32)
Calcium: 8.3 mg/dL — ABNORMAL LOW (ref 8.9–10.3)
Chloride: 99 mmol/L (ref 98–111)
Creatinine, Ser: 0.8 mg/dL (ref 0.44–1.00)
GFR calc Af Amer: >60 mL/min (ref >60)
GFR calc non Af Amer: >60 mL/min (ref >60)
Glucose, Bld: 488 mg/dL — ABNORMAL HIGH (ref 70–99)
Potassium: 4.4 mmol/L (ref 3.5–5.1)
Sodium: 136 mmol/L (ref 135–145)

## 2019-07-26 LAB — VANCOMYCIN, TROUGH: Vancomycin Tr: 11 ug/mL — ABNORMAL LOW (ref 15–20)

## 2019-08-02 ENCOUNTER — Other Ambulatory Visit: Payer: Self-pay

## 2019-08-02 ENCOUNTER — Emergency Department: Payer: Medicaid Other

## 2019-08-02 ENCOUNTER — Telehealth: Payer: Self-pay | Admitting: Pulmonary Disease

## 2019-08-02 ENCOUNTER — Other Ambulatory Visit: Payer: Medicaid Other

## 2019-08-02 ENCOUNTER — Emergency Department
Admission: EM | Admit: 2019-08-02 | Discharge: 2019-08-02 | Disposition: A | Discharge status: Home / Self Care | Payer: Medicaid Other | Attending: Emergency Medicine | Admitting: Emergency Medicine

## 2019-08-02 DIAGNOSIS — Z87891 Personal history of nicotine dependence: Secondary | ICD-10-CM | POA: Insufficient documentation

## 2019-08-02 DIAGNOSIS — E876 Hypokalemia: Secondary | ICD-10-CM

## 2019-08-02 DIAGNOSIS — Z20822 Contact with and (suspected) exposure to covid-19: Secondary | ICD-10-CM | POA: Insufficient documentation

## 2019-08-02 DIAGNOSIS — J449 Chronic obstructive pulmonary disease, unspecified: Secondary | ICD-10-CM | POA: Diagnosis not present

## 2019-08-02 DIAGNOSIS — Z79899 Other long term (current) drug therapy: Secondary | ICD-10-CM | POA: Diagnosis not present

## 2019-08-02 DIAGNOSIS — R5383 Other fatigue: Secondary | ICD-10-CM | POA: Insufficient documentation

## 2019-08-02 DIAGNOSIS — I130 Hypertensive heart and chronic kidney disease with heart failure and stage 1 through stage 4 chronic kidney disease, or unspecified chronic kidney disease: Secondary | ICD-10-CM | POA: Insufficient documentation

## 2019-08-02 DIAGNOSIS — E1122 Type 2 diabetes mellitus with diabetic chronic kidney disease: Secondary | ICD-10-CM | POA: Insufficient documentation

## 2019-08-02 DIAGNOSIS — N183 Chronic kidney disease, stage 3 unspecified: Secondary | ICD-10-CM | POA: Insufficient documentation

## 2019-08-02 DIAGNOSIS — I5032 Chronic diastolic (congestive) heart failure: Secondary | ICD-10-CM | POA: Insufficient documentation

## 2019-08-02 DIAGNOSIS — Z794 Long term (current) use of insulin: Secondary | ICD-10-CM | POA: Diagnosis not present

## 2019-08-02 DIAGNOSIS — Z7901 Long term (current) use of anticoagulants: Secondary | ICD-10-CM | POA: Diagnosis not present

## 2019-08-02 LAB — BASIC METABOLIC PANEL
Anion gap: 9 (ref 5–15)
BUN: 9 mg/dL (ref 6–20)
CO2: 32 mmol/L (ref 22–32)
Calcium: 9 mg/dL (ref 8.9–10.3)
Chloride: 101 mmol/L (ref 98–111)
Creatinine, Ser: 0.69 mg/dL (ref 0.44–1.00)
GFR calc Af Amer: >60 mL/min (ref >60)
GFR calc non Af Amer: >60 mL/min (ref >60)
Glucose, Bld: 201 mg/dL — ABNORMAL HIGH (ref 70–99)
Potassium: 3 mmol/L — ABNORMAL LOW (ref 3.5–5.1)
Sodium: 142 mmol/L (ref 135–145)

## 2019-08-02 LAB — URINALYSIS, COMPLETE (UACMP) WITH MICROSCOPIC
Bilirubin Urine: NEGATIVE
Glucose, UA: 50 mg/dL — AB
Hgb urine dipstick: NEGATIVE
Ketones, ur: NEGATIVE mg/dL
Nitrite: NEGATIVE
Protein, ur: NEGATIVE mg/dL
Specific Gravity, Urine: 1.006 (ref 1.005–1.030)
pH: 7 (ref 5.0–8.0)

## 2019-08-02 LAB — TROPONIN I (HIGH SENSITIVITY)
Troponin I (High Sensitivity): 8 ng/L (ref <18)
Troponin I (High Sensitivity): 7 ng/L (ref <18)

## 2019-08-02 LAB — MAGNESIUM: Magnesium: 1.5 mg/dL — ABNORMAL LOW (ref 1.7–2.4)

## 2019-08-02 LAB — CBC
HCT: 28.5 % — ABNORMAL LOW (ref 36.0–46.0)
Hemoglobin: 8.4 g/dL — ABNORMAL LOW (ref 12.0–15.0)
MCH: 22.5 pg — ABNORMAL LOW (ref 26.0–34.0)
MCHC: 29.5 g/dL — ABNORMAL LOW (ref 30.0–36.0)
MCV: 76.4 fL — ABNORMAL LOW (ref 80.0–100.0)
Platelets: 253 10*3/uL (ref 150–400)
RBC: 3.73 MIL/uL — ABNORMAL LOW (ref 3.87–5.11)
RDW: 17 % — ABNORMAL HIGH (ref 11.5–15.5)
WBC: 8.6 10*3/uL (ref 4.0–10.5)
nRBC: 0 % (ref 0.0–0.2)

## 2019-08-02 LAB — RESPIRATORY PANEL BY RT PCR (FLU A&B, COVID)
Influenza A by PCR: NEGATIVE
Influenza B by PCR: NEGATIVE
SARS Coronavirus 2 by RT PCR: NEGATIVE

## 2019-08-02 LAB — BRAIN NATRIURETIC PEPTIDE: B Natriuretic Peptide: 95 pg/mL (ref 0.0–100.0)

## 2019-08-02 MED ORDER — POTASSIUM CHLORIDE ER 10 MEQ PO TBCR: 20.0000 meq | EXTENDED_RELEASE_TABLET | Freq: Every day | ORAL | 0 refills | Status: DC

## 2019-08-02 MED ORDER — OXYCODONE HCL 5 MG PO TABS
5.0000 mg | ORAL_TABLET | Freq: Once | ORAL | Status: AC
  Administered 2019-08-02: 5 mg via ORAL
  Filled 2019-08-02: qty 1

## 2019-08-02 MED ORDER — POTASSIUM CHLORIDE CRYS ER 20 MEQ PO TBCR
40.0000 meq | EXTENDED_RELEASE_TABLET | Freq: Once | ORAL | Status: AC
  Administered 2019-08-02: 40 meq via ORAL
  Filled 2019-08-02: qty 2

## 2019-08-02 MED ORDER — MAGNESIUM SULFATE 2 GM/50ML IV SOLN
2.0000 g | Freq: Once | INTRAVENOUS | Status: AC
  Administered 2019-08-02: 2 g via INTRAVENOUS
  Filled 2019-08-02: qty 50

## 2019-08-02 MED ORDER — POTASSIUM CHLORIDE 10 MEQ/100ML IV SOLN
10.0000 meq | Freq: Once | INTRAVENOUS | Status: AC
  Administered 2019-08-02: 10 meq via INTRAVENOUS
  Filled 2019-08-02: qty 100

## 2019-08-02 MED ORDER — POTASSIUM CHLORIDE ER 10 MEQ TABLET,EXTENDED RELEASE
ORAL | 0.00000 days
Start: 2019-08-02 — End: ?

## 2019-08-02 NOTE — ED Provider Notes (Signed)
[facility] Emergency Department Provider Note  ____________________________________________   First MD Initiated Contact with Patient 08/02/19 1659     (approximate)  I have reviewed the triage vital signs and the nursing notes.   HISTORY  Chief Complaint Generalized Body Aches, Tachycardia, and Fatigue    HPI [name] [name] is a 59 y.o. female with history of CHF, prior Covid, diabetes who comes in for multiple symptoms.  Patient wears 3 L of oxygen at baseline.  Patient has a PICC line in she gets vancomycin through.  He states that today she started having loss of taste and smell and fatigue.  She states that it feels very similar to when she had Covid before.  She states that she feels some palpitations with a little bit of chest discomfort.  Also reports a little bit of shortness of breath that is worse than her baseline.  She does note a 15 pound weight gain and increasing swelling in her bilateral legs.  Patient has been compliant with her Eliquis and denies missing any doses.  Patient symptoms started today, constant, nothing makes it better, nothing makes them worse.  On review of records patient had an admission and discharged on 3/30.  Patient had septic shock and had MRSA growing in her blood.  Patient is currently on vancomycin until 09/06/2019.  TEE was negative for vegetations.  Patient has a PICC line.  Patient had a negative CT abdomen pelvis.  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          Past Medical History:  Diagnosis Date  . Abdominal wall hernia 01/29/2013  . Anxiety   . Arthritis    Rheumatoid  . C. difficile colitis   . Chronic diastolic heart failure (HCC)   . COVID-19 03/23/2019   Diagnosed at [facility] (send-out) on 03/23/2019  . Depression   . Diabetes mellitus    states no meds or diet restrictions  at present  . Diastolic CHF (HCC)   . Esophagitis   . Fluid retention   . GERD (gastroesophageal  reflux disease)   . Hiatal hernia   . Hypertension   . Hypokalemia due to loss of potassium 10/21/2015   Overview:  Associated with 3 weeks of diarrhea  And QT prolongation.  . Hypothyroidism   . IBS (irritable bowel syndrome)   . Moderate episode of recurrent major depressive disorder (HCC) 06/03/2004  . Morbid obesity (HCC)   . MRSA (methicillin resistant Staphylococcus aureus) infection 11/2017   left inner thigh abcess  . Neurogenic bladder    has pacemaker  . Neuropathy   . Obesity   . Panic attacks   . Pneumonia due to COVID-19 virus   . Rheumatoid arthritis (HCC)   . Sleep apnea    STATES SEVERE, CANT TOLERATE MASK- LAST STUDY YEARS AGO    Patient Active Problem List   Diagnosis Date Noted  . Bacteremia   . Paroxysmal A-fib (HCC)   . Right arm weakness   . Ileus (HCC)   . Splenic infarct   . Acute metabolic encephalopathy 07/08/2019  . Hyperglycemia due to type 2 diabetes mellitus (HCC) 07/08/2019  . Morbid obesity with BMI of 50.0-59.9, adult (HCC) 07/08/2019  . History of COVID-19 07/08/2019  . Severe sepsis with septic shock (HCC) 07/08/2019  . Septic shock (HCC) 07/08/2019  . Acute respiratory disease due to COVID-19 virus 04/06/2019  . Type 2 diabetes mellitus without complication, without long-term current  use of insulin (HCC) 04/06/2019  . CKD (chronic kidney disease), stage III 04/06/2019  . HLD (hyperlipidemia) 04/06/2019  . COVID-19 03/27/2019  . Pneumonia due to COVID-19 virus 03/27/2019  . Abdominal pain 02/12/2019  . AKI (acute kidney injury) (HCC) 02/12/2019  . Cellulitis, abdominal wall 02/04/2019  . Elevated C-reactive protein (CRP) 12/15/2018  . Elevated sed rate 12/15/2018  . Pharmacologic therapy 11/06/2018  . Disorder of skeletal system 11/06/2018  . Problems influencing health status 11/06/2018  . Decubitus ulcer of heel, bilateral 02/21/2018  . Diabetic ulcer of toe of right foot associated with type 2 diabetes mellitus (HCC) 02/14/2018  .  Ulcer of left heel and midfoot with fat layer exposed (HCC) 02/14/2018  . Sepsis due to methicillin resistant Staphylococcus aureus (MRSA) without acute organ dysfunction (HCC) 11/17/2017  . Cellulitis 11/15/2017  . Perineal abscess 11/15/2017  . Subacute vulvitis 11/01/2017  . Chronic cystitis 03/01/2017  . Chronic pain syndrome 03/31/2016  . Insomnia secondary to chronic pain 03/31/2016  . Chronic upper back pain 12/25/2015  . Chronic hand pain (Bilateral) (L>R) 12/25/2015  . Rheumatoid arthritis (HCC) 12/25/2015  . Osteoarthritis, multiple sites 12/24/2015  . Chronic foot pain (Primary Area of Pain) (Bilateral) (L>R) 12/24/2015  . Chronic elbow pain (Third area of Pain) (Bilateral) (L>R) 12/24/2015  . Chronic shoulder pain (Bilateral) (L>R) 12/24/2015  . Chronic neck pain (Bilateral) (R>L) 12/24/2015  . 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[employer]) 12/23/2015  . Chronic knee pain (Bilateral) (R>L) 12/23/2015  . Long term current use of opiate analgesic 12/23/2015  . Long term prescription opiate use 12/23/2015  . Opiate use (30 MME/Day) 12/23/2015  . Neurogenic pain 12/23/2015  . Neuropathic pain 12/23/2015  . Diabetic peripheral neuropathy (HCC) 12/23/2015  . Encounter for therapeutic drug level monitoring 12/23/2015  . Encounter for pain management planning 12/23/2015  . GERD (gastroesophageal reflux disease) 11/25/2015  . Neuropathy 11/25/2015  . Diarrhea 10/22/2015  . Hypokalemia 10/21/2015  . Hypomagnesemia 10/21/2015  . QT prolongation 10/21/2015  . 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 Chronic wrist pain (Secondary area of Pain) (Bilateral) (L>R) 03/19/2015  . 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 Female genuine stress incontinence 02/14/2015  . Urge incontinence of urine 02/14/2015  . Obstructive apnea 07/03/2014  . Chronic diastolic CHF (congestive heart failure) (HCC) 07/03/2014  . Anxiety 01/03/2014  . Diabetic ulcer of  heel (HCC) 01/03/2014  . COPD (chronic obstructive pulmonary disease) (HCC) 01/03/2014  . Bipolar disorder, unspecified (HCC) 01/03/2014  . Diastolic dysfunction 01/03/2014  . Combined fat and carbohydrate induced hyperlipemia 01/03/2014  . Shortness of breath 01/03/2014  . Acute on chronic congestive heart failure (HCC) 01/03/2014  . Incomplete bladder emptying 11/02/2012  . Bladder retention 10/10/2012  . Detrusor muscle hypertonia 10/04/2012  . Obstruction of urinary tract 10/04/2012  . FOM (frequency of micturition) 10/04/2012  . Mixed incontinence 10/04/2012  . Vitamin D insufficiency 04/15/2012  . Borderline personality disorder (HCC) 01/06/2012  . Anxiety and depression 01/06/2012  . History of laparoscopic adjustable gastric banding, 03/20/2007.  Removed 09/19/2011. 08/04/2011  . Nausea & vomiting 08/04/2011  . Hypothyroidism 06/28/2010  . Rheumatoid arthritis 06/28/2010  . Obesity 03/29/2005  . Essential (primary) hypertension 01/27/2005  . Major depressive disorder, recurrent episode, moderate (HCC) 06/03/2004    Past Surgical History:  Procedure Laterality Date  . ABDOMINAL HYSTERECTOMY    . CHOLECYSTECTOMY    . DG GREAT TOE RIGHT FOOT  02/23/2018  . EYE SURGERY     bilateral  cataract extraction with IOL  . HERNIA REPAIR     ventral hernia with strangulation  . LAPAROSCOPIC GASTRIC BANDING  03/20/07  . TEE WITHOUT CARDIOVERSION N/A 07/16/2019   Procedure: TRANSESOPHAGEAL ECHOCARDIOGRAM (TEE);  Surgeon: Debbe Odea, MD;  Location: [facility] ORS;  Service: Cardiovascular;  Laterality: N/A;  . TONSILLECTOMY    . TUBAL LIGATION      Prior to Admission medications   Medication Sig Start Date End Date Taking? Authorizing Provider  ALPRAZolam Prudy Feeler) 0.5 MG tablet Take 1 tablet (0.5 mg total) by mouth 2 (two) times daily as needed for anxiety. 07/17/19   Alford Highland, MD  apixaban (ELIQUIS) 5 MG TABS tablet Two tabs po twcie a day for 6 days then one tablet twice a day  after that 07/17/19   Alford Highland, MD  ARIPiprazole (ABILIFY) 5 MG tablet TAKE 1 TABLET BY MOUTH ONCE DAILY. Patient taking differently: Take 5 mg by mouth daily.  02/22/19   Clapacs, Jackquline Denmark, MD  ascorbic acid (VITAMIN C) 500 MG tablet Take 1 tablet (500 mg total) by mouth daily. Patient not taking: Reported on 07/11/2019 05/05/19   Rolly Salter, MD  budesonide (PULMICORT) 0.5 MG/2ML nebulizer solution Take 2 mLs (0.5 mg total) by nebulization 2 (two) times daily. 06/04/19 06/03/20  Salena Saner, MD  busPIRone (BUSPAR) 10 MG tablet TAKE 1 TABLET BY MOUTH TWICE DAILY Patient taking differently: Take 10 mg by mouth 2 (two) times daily.  02/05/19   Clapacs, Jackquline Denmark, MD  Cholecalciferol (VITAMIN D3) 125 MCG (5000 UT) CAPS Take 1 capsule (5,000 Units total) by mouth daily with breakfast. Take along with calcium and magnesium. 07/17/19 08/16/19  Alford Highland, MD  Ensure Max Protein (ENSURE MAX PROTEIN) LIQD Take 330 mLs (11 oz total) by mouth 2 (two) times daily between meals. 05/04/19   Rolly Salter, MD  famotidine (PEPCID) 20 MG tablet Take 1 tablet (20 mg total) by mouth daily. 05/05/19   Rolly Salter, MD  FLUoxetine (PROZAC) 20 MG capsule Take 1 capsule (20 mg total) by mouth daily. TAKE (1) CAPSULE BY MOUTH ONCE DAILY. Patient taking differently: Take 20 mg by mouth daily.  05/24/19   Clapacs, Jackquline Denmark, MD  folic acid (FOLVITE) 1 MG tablet Take 1 mg by mouth daily.     [provider]  [facility] CALCIUM CITRATE+D MAXIMUM 315-250 MG-UNIT TABS Take 1 tablet by mouth daily. 06/15/19   [provider]  insulin degludec (TRESIBA FLEXTOUCH) 200 UNIT/ML FlexTouch Pen Inject 22 Units into the skin in the morning and at bedtime. 07/17/19   Alford Highland, MD  insulin lispro (HUMALOG) 100 UNIT/ML injection Inject 0.08 mLs (8 Units total) into the skin 3 (three) times daily before meals. 07/17/19   Wieting, Richard, MD  ipratropium-albuterol (DUONEB) 0.5-2.5 (3) MG/3ML SOLN Take 3 mLs by  nebulization 3 (three) times daily. Patient not taking: Reported on 07/11/2019 05/04/19   Rolly Salter, MD  levothyroxine (SYNTHROID, LEVOTHROID) 88 MCG tablet Take 88 mcg by mouth daily before breakfast.    [provider]  oxyCODONE (OXY IR/ROXICODONE) 5 MG immediate release tablet Take 1 tablet (5 mg total) by mouth every 6 (six) hours as needed for severe pain. Must last 30 days 04/15/19 05/15/19  Delano Metz, MD  polyethylene glycol (MIRALAX / GLYCOLAX) 17 g packet Take 17 g by mouth daily as needed for mild constipation. 05/04/19   Rolly Salter, MD  pramipexole (MIRAPEX) 0.125 MG tablet Take 0.25 mg by mouth  at bedtime.     [provider]  predniSONE (DELTASONE) 10 MG tablet 3 tabs po day1; 2 tabs po day2; 1 tab po day3,4; 1/2 tab po day5,6,7,8 07/17/19   Alford Highland, MD  pregabalin (LYRICA) 150 MG capsule Take 1 capsule (150 mg total) by mouth at bedtime. 07/17/19 01/13/20  Alford Highland, MD  QUEtiapine (SEROQUEL) 300 MG tablet TAKE ONE TABLET BY MOUTH AT BEDTIME. Patient taking differently: Take 300 mg by mouth at bedtime. TAKE ONE TABLET BY MOUTH AT BEDTIME. 02/05/19   Clapacs, Jackquline Denmark, MD  vancomycin IVPB Inject 1,750 mg into the vein daily. Indication:  MRSA Bactermia with splenic infarcts Last Day of Therapy:  08/20/2019 [facility] Care Per Protocol: Aim for Vancomycin Trough of 15-20 Labs weekly Monday while on IV antibiotics: _X_ CBC with differential _X_CMP _X__ Vancomycin trough  Labs every Thursday while on antibiotics X BMP X Vancomycin trough  _X_ Please pull PIC at completion of IV antibiotics _ Fax weekly labs to Dr.Ravishankar [phone] Clinic Follow Up Appt:4 weeks  Call [phone] to make appt 07/17/19 08/20/19  Alford Highland, MD  zolpidem (AMBIEN) 5 MG tablet Take 1 tablet (5 mg total) by mouth at bedtime. TAKE 1 TABLET BY MOUTH EVERYDAY AT BEDTIME Patient taking differently: Take 5 mg by mouth at bedtime.  05/24/19   Clapacs,  Jackquline Denmark, MD    Allergies Cephalexin, Codeine, Doxycycline, Propoxyphene, Sulfa antibiotics, Lovenox [enoxaparin sodium], Hydrocodone, and Meropenem  Family History  Problem Relation Age of Onset  . Heart failure Father   . Bipolar disorder Father   . Alcohol abuse Father   . Anxiety disorder Father   . Depression Father   . Heart disease Brother   . Heart attack Brother 55       MI s/p stents placed  . Anxiety disorder Sister   . Depression Sister   . Anxiety disorder Sister   . Depression Sister   . Bipolar disorder Sister   . Alcohol abuse Sister   . Drug abuse Sister   . Heart attack Brother     Social History Social History   Tobacco Use  . Smoking status: Former Smoker    Packs/day: 2.00    Years: 27.00    Pack years: 54.00    Types: Cigarettes    Quit date: 07/30/1999    Years since quitting: 20.0  . Smokeless tobacco: Never Used  Substance Use Topics  . Alcohol use: No  . Drug use: No      Review of Systems Constitutional: No fever/chills, fatigue Eyes: No visual changes. ENT: No sore throat.  Loss of taste and smell Cardiovascular positive chest pain, palpitation. Respiratory: Positive shortness of breath Gastrointestinal: No abdominal pain.  No nausea, no vomiting.  No diarrhea.  No constipation. Genitourinary: Negative for dysuria. Musculoskeletal: Negative for back pain.  Leg swelling Skin: Negative for rash. Neurological: Negative for headaches, focal weakness or numbness. All other ROS negative ____________________________________________   PHYSICAL EXAM:  VITAL SIGNS: ED Triage Vitals  Enc Vitals Group     BP 08/02/19 1459 (!) 157/80     Pulse Rate 08/02/19 1459 86     Resp 08/02/19 1459 18     Temp 08/02/19 1459 98.6 F (37 C)     Temp Source 08/02/19 1459 Oral     SpO2 08/02/19 1459 100 %     Weight 08/02/19 1500 (!) 348 lb 5.2 oz (158 kg)     Height 08/02/19 1500 5\' 10"  (1.778  m)     Head Circumference --      Peak Flow --       Pain Score 08/02/19 1500 6     Pain Loc --      Pain Edu? --      Excl. in GC? --     Constitutional: Alert and oriented.  Obese female on her baseline oxygen Eyes: Conjunctivae are normal. EOMI. Head: Atraumatic. Nose: No congestion/rhinnorhea. Mouth/Throat: Mucous membranes are moist.   Neck: No stridor. Trachea Midline. FROM Cardiovascular: Normal rate, regular rhythm. Grossly normal heart sounds.  Good peripheral circulation. Respiratory: Normal respiratory effort.  No retractions. Lungs CTAB. Gastrointestinal: Soft and nontender. No distention. No abdominal bruits.  Musculoskeletal: 1+ edema bilaterally in the lower extremity no joint effusions.  Some redness on the legs that patient states is baseline.  PICC line in the right arm without any infection or swelling noted to the arm Neurologic:  Normal speech and language. No gross focal neurologic deficits are appreciated.  Skin:  Skin is warm, dry and intact. No rash noted. Psychiatric: Mood and affect are normal. Speech and behavior are normal. GU: Deferred   ____________________________________________   LABS (all labs ordered are listed, but only abnormal results are displayed)  Labs Reviewed  BASIC METABOLIC PANEL - Abnormal; Notable for the following components:      Result Value   Potassium 3.0 (*)    Glucose, Bld 201 (*)    All other components within normal limits  CBC - Abnormal; Notable for the following components:   RBC 3.73 (*)    Hemoglobin 8.4 (*)    HCT 28.5 (*)    MCV 76.4 (*)    MCH 22.5 (*)    MCHC 29.5 (*)    RDW 17.0 (*)    All other components within normal limits  URINALYSIS, COMPLETE (UACMP) WITH MICROSCOPIC - Abnormal; Notable for the following components:   Color, Urine YELLOW (*)    APPearance CLEAR (*)    Glucose, UA 50 (*)    Leukocytes,Ua TRACE (*)    Bacteria, UA RARE (*)    All other components within normal limits  MAGNESIUM - Abnormal; Notable for the following components:    Magnesium 1.5 (*)    All other components within normal limits  RESPIRATORY PANEL BY RT PCR (FLU A&B, COVID)  URINE CULTURE  BRAIN NATRIURETIC PEPTIDE  TROPONIN I (HIGH SENSITIVITY)  TROPONIN I (HIGH SENSITIVITY)   ____________________________________________   ED ECG REPORT I, Concha Se, the attending physician, personally viewed and interpreted this ECG.  EKG shows sinus rhythm with occasional PAC QTC is 500, no ST elevation, no T wave inversions, normal intervals  Repeat EKG is sinus rate of 83, no ST elevation, no T wave inversions, PACs have now resolved and QTC has gotten better at 467 ____________________________________________  RADIOLOGY I, Concha Se, personally viewed and evaluated these images (plain radiographs) as part of my medical decision making, as well as reviewing the written report by the radiologist.  ED MD interpretation: No obvious pneumonia  Official radiology report(s): DG Chest Portable 1 View  Result Date: 08/02/2019 CLINICAL DATA:  Shortness of breath with loss of smell EXAM: PORTABLE CHEST 1 VIEW COMPARISON:  July 08, 2019 chest radiograph and July 09, 2019 CT angiogram chest FINDINGS: There remains somewhat diffuse interstitial thickening throughout the lungs without frank airspace opacity. No appreciable pleural effusions. Heart upper normal in size with pulmonary vascularity normal. No bone lesions. IMPRESSION: Diffuse interstitial  thickening throughout the lungs without frank airspace opacity. Question developing fibrosis bilaterally. A degree of residua from atypical organism pneumonia is quite possible. No consolidation or well-defined airspace opacity on this study. Heart upper normal in size.  No adenopathy evident. Electronically Signed   By: Lowella Grip III M.D.   On: 08/02/2019 17:54    ____________________________________________   PROCEDURES  Procedure(s) performed (including Critical Care):  .1-3 Lead EKG  Interpretation Performed by: Vanessa [place], MD Authorized by: Vanessa , MD     Interpretation: abnormal     ECG rate:  80-90s   ECG rate assessment: normal     Rhythm: sinus rhythm     Ectopy: PAC     Conduction: normal       ____________________________________________   INITIAL IMPRESSION / ASSESSMENT AND PLAN / ED COURSE  JESSIA KIEF was evaluated in Emergency Department on 08/02/2019 for the symptoms described in the history of present illness. She was evaluated in the context of the global COVID-19 pandemic, which necessitated consideration that the patient might be at risk for infection with the SARS-CoV-2 virus that causes COVID-19. Institutional protocols and algorithms that pertain to the evaluation of patients at risk for COVID-19 are in a state of rapid change based on information released by regulatory bodies including the CDC and federal and state organizations. These policies and algorithms were followed during the patient's care in the ED.    Patient is a 59 year old with very complex medical history given she has had Covid and MRSA bacteremia who is currently on vancomycin who comes in for fatigue, loss of taste and smell concerning for Covid.  Patient is on her baseline oxygen.  She is on Eliquis for low suspicion for PE.  Will get chest x-ray to make sure is no evidence of edema in her lungs and BNP evaluate for heart failure.  Patient does have a little bit of swelling in her legs but she also has some redness that patient states is chronic for her so it seems to be like it could be related to chronic venous stasis.  Will get labs to evaluate for Electra abnormalities, AKI, ACS given some palpitations and chest discomfort.  Will get UA to evaluate for UTI but really not have any symptoms for that.  Patient is mostly concerned that she is has Covid again   Hemoglobin is 8.4 which is around patient's baseline. BMP shows a sugar of 201 but no evidence of anion gap  elevation to suggest DKA Potassium was 3 and mag was 1.5.  Patient was having some PACs on the monitor.  Patient was given IV potassium and oral potassium as well as some IV magnesium.  Patient stated that her palpitations have since resolved.  On EKG and on cardiac monitor she seems to be having less PACs.  Her BNP and chest x-ray had no evidence of heart failure.  I suspect that the swelling is more likely related to chronic venous stasis.  Could consider trialing Lasix but with her Electra abnormalities causing palpitations I think should focus on correcting that first.  We discussed using compression socks.  Patient states that she ambulates on her own at home and occasionally uses a walker when she goes outside.  Patient has no fever times two count and denies being on any fever reducers such as Tylenol or ibuprofen.  She is got no white count elevation to suggest recurrent sepsis.  Patient's been on her vancomycin and has not missed  any doses.  She is ambulating with her baseline oxygen.  At this time I suspect that this is most likely from a viral illness versus deconditioning from her recent hospitalizations.. patient's Covid swab was negative.  Patient was able to ambulate on her baseline without desaturations.  patient understands that she should follow-up with her primary doctor and she should return to the ER if she develops fevers or worsening shortness of breath or any other concerns.         ____________________________________________   FINAL CLINICAL IMPRESSION(S) / ED DIAGNOSES   Final diagnoses:  Fatigue, unspecified type  Hypokalemia  Hypomagnesemia      MEDICATIONS GIVEN DURING THIS VISIT:  Medications  oxyCODONE (Oxy IR/ROXICODONE) immediate release tablet 5 mg (5 mg Oral Given 08/02/19 1734)  potassium chloride SA (KLOR-CON) CR tablet 40 mEq (40 mEq Oral Given 08/02/19 1734)  potassium chloride 10 mEq in 100 mL IVPB ( Intravenous Stopped 08/02/19 1851)  magnesium  sulfate IVPB 2 g 50 mL ( Intravenous Stopped 08/02/19 2034)     ED Discharge Orders         Ordered    potassium chloride (KLOR-CON) 10 MEQ tablet  Daily     08/02/19 2027           Note:  This document was prepared using Dragon voice recognition software and may include unintentional dictation errors.   Concha Se, MD 08/03/19 [phone]

## 2019-08-02 NOTE — ED Triage Notes (Signed)
Pt reports that she is experiencing body aches, nausea, palpitations, tachycardia, fatigue. States similar sx as when she had COVID in December. Pt arrives wears 3L that she has been wearing since she had COVID. Pt arrives with PICC line in right arm--gives herself her own vancomycin.

## 2019-08-02 NOTE — ED Notes (Signed)
Pt to ED c/o "COVID symptoms again". Reports COVID in December Symptoms include loss of smell, appetite, and shob that started a few days ago. Pt states she wears 3L [place] at home. Pt SpO2 91% on RA, placed on 2L [place] for SpO2 100% PICC line noted to right arm, states she received it when she was here for sepsis a few weeks ago, reports continuing vanc through picc line, received at home today.  Pt appears in NAD, breathing even and unlabored Stretcher locked in lowest position, call bell within reach

## 2019-08-02 NOTE — Discharge Instructions (Addendum)
Your potassium and magnesium are low causing some PACs on your EKG.  After we corrected these your PACs resolved and your palpitations resolved.  You should take the potassium for the next 3 days and follow-up on Monday for recheck of your labs.  You had no evidence of Covid and your chest x-ray just shows some changes from your prior Covid.  You are on your baseline oxygen.  There is no other evidence of infection at this time given you had no fever no white count however with your significant history it would be important that if you develop fever that you return to the ER immediately or if your symptoms are worsening at all.

## 2019-08-02 NOTE — ED Notes (Signed)
Pt ambulatory on 2L [place] with SpO2 of 100%

## 2019-08-02 NOTE — Telephone Encounter (Signed)
Called and spoke with pt's mother Kathie Rhodes who stated pt lost her taste and smell 4 days ago and she began to experience worsening SOB 2-3 days ago. Stated to [place] that pt needs to tested for covid and she verbalized understanding. I provided Kathie Rhodes the phone number she could call to get pt scheduled for a covid test and also told her she could go on [facility] website and go to the covid-19 testing to get an appt made online as well. Nothing further needed.

## 2019-08-03 ENCOUNTER — Other Ambulatory Visit
Admission: RE | Admit: 2019-08-03 | Discharge: 2019-08-03 | Disposition: A | Discharge status: Home / Self Care | Payer: Medicaid Other | Source: Other Acute Inpatient Hospital | Attending: Infectious Diseases | Admitting: Infectious Diseases

## 2019-08-03 ENCOUNTER — Encounter: Payer: Self-pay | Admitting: Adult Health

## 2019-08-03 ENCOUNTER — Ambulatory Visit (INDEPENDENT_AMBULATORY_CARE_PROVIDER_SITE_OTHER): Payer: Medicaid Other | Admitting: Adult Health

## 2019-08-03 DIAGNOSIS — A4902 Methicillin resistant Staphylococcus aureus infection, unspecified site: Secondary | ICD-10-CM | POA: Diagnosis not present

## 2019-08-03 DIAGNOSIS — I5032 Chronic diastolic (congestive) heart failure: Secondary | ICD-10-CM | POA: Diagnosis not present

## 2019-08-03 DIAGNOSIS — J9611 Chronic respiratory failure with hypoxia: Secondary | ICD-10-CM | POA: Diagnosis not present

## 2019-08-03 DIAGNOSIS — D735 Infarction of spleen: Secondary | ICD-10-CM | POA: Diagnosis not present

## 2019-08-03 DIAGNOSIS — R7881 Bacteremia: Secondary | ICD-10-CM | POA: Diagnosis not present

## 2019-08-03 DIAGNOSIS — J8410 Pulmonary fibrosis, unspecified: Secondary | ICD-10-CM

## 2019-08-03 DIAGNOSIS — J439 Emphysema, unspecified: Secondary | ICD-10-CM

## 2019-08-03 DIAGNOSIS — R5381 Other malaise: Secondary | ICD-10-CM

## 2019-08-03 LAB — URINE CULTURE

## 2019-08-03 LAB — BASIC METABOLIC PANEL
Anion gap: 12 (ref 5–15)
BUN: 9 mg/dL (ref 6–20)
CO2: 28 mmol/L (ref 22–32)
Calcium: 8.5 mg/dL — ABNORMAL LOW (ref 8.9–10.3)
Chloride: 97 mmol/L — ABNORMAL LOW (ref 98–111)
Creatinine, Ser: 0.89 mg/dL (ref 0.44–1.00)
GFR calc Af Amer: >60 mL/min (ref >60)
GFR calc non Af Amer: >60 mL/min (ref >60)
Glucose, Bld: 302 mg/dL — ABNORMAL HIGH (ref 70–99)
Potassium: 3.2 mmol/L — ABNORMAL LOW (ref 3.5–5.1)
Sodium: 137 mmol/L (ref 135–145)

## 2019-08-03 LAB — VANCOMYCIN, TROUGH: Vancomycin Tr: 19 ug/mL (ref 15–20)

## 2019-08-03 MED ORDER — FUROSEMIDE 20 MG PO TABS: 20.0000 mg | ORAL_TABLET | Freq: Every day | ORAL | 1 refills | Status: DC | PRN

## 2019-08-03 MED ORDER — POTASSIUM CHLORIDE ER 20 MEQ PO TBCR: 20.0000 meq | EXTENDED_RELEASE_TABLET | Freq: Every day | ORAL | 1 refills | Status: DC

## 2019-08-03 NOTE — Addendum Note (Signed)
Addended by: Vianne Bulls R on: 08/03/2019 10:17 AM   Modules accepted: Orders

## 2019-08-03 NOTE — Progress Notes (Signed)
Virtual Visit via Telephone Note  I connected with [name] [name] on 08/03/19 at  9:30 AM EDT by telephone and verified that I am speaking with the correct person using two identifiers.  Location: Patient: Home  Provider: Office    I discussed the limitations, risks, security and privacy concerns of performing an evaluation and management service by telephone and the availability of in person appointments. I also discussed with the patient that there may be a patient responsible charge related to this service. The patient expressed understanding and agreed to proceed.   History of Present Illness: 59 year old female former  smoker seen for pulmonary consult after developing COVID-07 April 2019 with acute respiratory distress severe hypoxemia Extensive medical history including rheumatoid arthritis, diastolic heart failure, chronic kidney disease stage III, diabetes, GERD  Today's televisit is an acute office visit for shortness of breath..  Patient was treated for COVID-19 infection in December 2020.  She received remdesivir and steroids.  Patient continued to have ongoing respiratory distress.  Was admitted to the hospital December 18 through May 04, 2019 for acute hypoxic respiratory failure secondary to ARDS, decompensated congestive heart failure, severe deconditioning.  Patient has had progressive difficulties and was readmitted March 20 through March 30 for septic shock with MRSA bacteremia.  She was treated with aggressive antibiotics seen by infectious disease and recommended for 6 weeks total of IV vancomycin.  Last dose planned for Aug 20, 2019.  TEE was negative for vegetation.  Patient has a PICC line in her right arm.  ESR was elevated at 65.  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  She was started on Eliquis. Patient does have rheumatoid arthritis.  Her methotrexate is currently on hold. On chronic steroids with prednisone 53m daily    Patient complains  Patient was seen  in the emergency room yesterday for ongoing shortness of breath and fatigue.  Chest x-ray showed diffuse interstitial thickening without acute consolidation.  Labs showed a negative troponin.  Anemia was stable.  BMP was normal.  Influenza and COVID-19 were negative  Remains on Pulmicort and duoneb - not taking lost tubing .  Weight is up 50lb over last 6 months .  Appetite is low.  Retaining a lot of fluid with ankle and lower legs.    Observations/Objective: Speaks in full sentences with no audible distress or wheezing O2 saturation greater than 90% on 3 L   Assessment and Plan: Post Covid pulmonary fibrosis-requiring oxygen-patient continues to have high symptom burden.  We will continue to follow very closely as patient has multiple comorbidities and is at high risk for decompensation. Going forward when a formal pulmonary function testing Suspected diastolic CHF decompensation-patient has had a 50 pound weight gain.  Has lower extremity edema per report.-We will begin gentle diuresis with close follow-up and follow-up labs.  Hypokalemia-emergency room visit labs were reviewed.  Patient had lower potassium.  We will begin potassium she did not pick up the prescription from the emergency room.  We will begin 20 mEq of potassium as she is also started on Lasix above.  We will have a be met on follow-up visit in 1 week  Chronic hypoxic respiratory failure-continue on oxygen to keep O2 saturations greater than 88 to 90%.  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  Severe deconditioning-continue activity as tolerated.  Once more stable consider home PT  MRSA bacteremia-on prolonged antibiotics with vancomycin.  Infectious disease following.  Patient has a PICC line and is receiving vancomycin with last dose Aug 20, 2019  Rheumatoid arthritis-methotrexate currently on hold   Plan  Patient Instructions  Begin Lasix 40 mg x 1 dose today then 20  mg daily Begin potassium 20 mEq daily Restart Pulmicort nebulizer twice daily Restart DuoNeb nebulizer 3 times daily Order for new nebulizer tubing sent to your homecare company Low-salt diet Keep legs elevated Continue on oxygen 3 L at rest and 4 L with activity O2 saturation goal greater than 88 to 90% Activity as tolerated Continue on Eliquis Follow-up in 1 week with Dr. Chase Caller or APP and As needed   Please contact office for sooner follow up if symptoms do not improve or worsen or seek emergency care       Follow Up Instructions: Follow-up in 1 week and as needed   I discussed the assessment and treatment plan with the patient. The patient was provided an opportunity to ask questions and all were answered. The patient agreed with the plan and demonstrated an understanding of the instructions.   The patient was advised to call back or seek an in-person evaluation if the symptoms worsen or if the condition fails to improve as anticipated.  I provided 30 minutes of non-face-to-face time during this encounter.   Rexene Edison, NP

## 2019-08-03 NOTE — Patient Instructions (Signed)
Begin Lasix 40 mg x 1 dose today then 20 mg daily Begin potassium 20 mEq daily Restart Pulmicort nebulizer twice daily Restart DuoNeb nebulizer 3 times daily Order for new nebulizer tubing sent to your homecare company Low-salt diet Keep legs elevated Continue on oxygen 3 L at rest and 4 L with activity O2 saturation goal greater than 88 to 90% Activity as tolerated Continue on Eliquis Follow-up in 1 week with Dr. Marchelle Gearing or APP and As needed   Please contact office for sooner follow up if symptoms do not improve or worsen or seek emergency care

## 2019-08-06 ENCOUNTER — Other Ambulatory Visit
Admission: RE | Admit: 2019-08-06 | Discharge: 2019-08-06 | Disposition: A | Discharge status: Home / Self Care | Payer: Medicaid Other | Source: Ambulatory Visit | Attending: Infectious Diseases | Admitting: Infectious Diseases

## 2019-08-06 DIAGNOSIS — R6521 Severe sepsis with septic shock: Secondary | ICD-10-CM | POA: Diagnosis present

## 2019-08-06 DIAGNOSIS — R7881 Bacteremia: Secondary | ICD-10-CM | POA: Insufficient documentation

## 2019-08-06 LAB — CBC WITH DIFFERENTIAL/PLATELET
Abs Immature Granulocytes: 0.13 10*3/uL — ABNORMAL HIGH (ref 0.00–0.07)
Basophils Absolute: 0 10*3/uL (ref 0.0–0.1)
Basophils Relative: 0 %
Eosinophils Absolute: 0 10*3/uL (ref 0.0–0.5)
Eosinophils Relative: 0 %
HCT: 24.1 % — ABNORMAL LOW (ref 36.0–46.0)
Hemoglobin: 7.3 g/dL — ABNORMAL LOW (ref 12.0–15.0)
Immature Granulocytes: 2 %
Lymphocytes Relative: 18 %
Lymphs Abs: 1.2 10*3/uL (ref 0.7–4.0)
MCH: 23 pg — ABNORMAL LOW (ref 26.0–34.0)
MCHC: 30.3 g/dL (ref 30.0–36.0)
MCV: 76 fL — ABNORMAL LOW (ref 80.0–100.0)
Monocytes Absolute: 0.5 10*3/uL (ref 0.1–1.0)
Monocytes Relative: 7 %
Neutro Abs: 5.1 10*3/uL (ref 1.7–7.7)
Neutrophils Relative %: 73 %
Platelets: 269 10*3/uL (ref 150–400)
RBC: 3.17 MIL/uL — ABNORMAL LOW (ref 3.87–5.11)
RDW: 17.6 % — ABNORMAL HIGH (ref 11.5–15.5)
WBC: 6.9 10*3/uL (ref 4.0–10.5)
nRBC: 0 % (ref 0.0–0.2)

## 2019-08-06 LAB — VANCOMYCIN, TROUGH: Vancomycin Tr: 22 ug/mL (ref 15–20)

## 2019-08-06 LAB — COMPREHENSIVE METABOLIC PANEL
ALT: 11 U/L (ref 0–44)
AST: 11 U/L — ABNORMAL LOW (ref 15–41)
Albumin: 2.6 g/dL — ABNORMAL LOW (ref 3.5–5.0)
Alkaline Phosphatase: 63 U/L (ref 38–126)
Anion gap: 9 (ref 5–15)
BUN: 14 mg/dL (ref 6–20)
CO2: 28 mmol/L (ref 22–32)
Calcium: 8.2 mg/dL — ABNORMAL LOW (ref 8.9–10.3)
Chloride: 101 mmol/L (ref 98–111)
Creatinine, Ser: 0.74 mg/dL (ref 0.44–1.00)
GFR calc Af Amer: >60 mL/min (ref >60)
GFR calc non Af Amer: >60 mL/min (ref >60)
Glucose, Bld: 354 mg/dL — ABNORMAL HIGH (ref 70–99)
Potassium: 3.5 mmol/L (ref 3.5–5.1)
Sodium: 138 mmol/L (ref 135–145)
Total Bilirubin: 0.4 mg/dL (ref 0.3–1.2)
Total Protein: 5.9 g/dL — ABNORMAL LOW (ref 6.5–8.1)

## 2019-08-09 ENCOUNTER — Other Ambulatory Visit
Admission: RE | Admit: 2019-08-09 | Discharge: 2019-08-09 | Disposition: A | Discharge status: Home / Self Care | Payer: Medicaid Other | Source: Ambulatory Visit | Attending: Infectious Diseases | Admitting: Infectious Diseases

## 2019-08-09 ENCOUNTER — Telehealth: Payer: Self-pay | Admitting: Infectious Diseases

## 2019-08-09 DIAGNOSIS — R7881 Bacteremia: Secondary | ICD-10-CM | POA: Diagnosis not present

## 2019-08-09 DIAGNOSIS — R6521 Severe sepsis with septic shock: Secondary | ICD-10-CM | POA: Insufficient documentation

## 2019-08-09 LAB — BASIC METABOLIC PANEL
Anion gap: 10 (ref 5–15)
BUN: 19 mg/dL (ref 6–20)
CO2: 28 mmol/L (ref 22–32)
Calcium: 8.5 mg/dL — ABNORMAL LOW (ref 8.9–10.3)
Chloride: 98 mmol/L (ref 98–111)
Creatinine, Ser: 0.9 mg/dL (ref 0.44–1.00)
GFR calc Af Amer: >60 mL/min (ref >60)
GFR calc non Af Amer: >60 mL/min (ref >60)
Glucose, Bld: 550 mg/dL (ref 70–99)
Potassium: 4.3 mmol/L (ref 3.5–5.1)
Sodium: 136 mmol/L (ref 135–145)

## 2019-08-09 LAB — VANCOMYCIN, TROUGH: Vancomycin Tr: 14 ug/mL — ABNORMAL LOW (ref 15–20)

## 2019-08-09 NOTE — Telephone Encounter (Signed)
Critical Lab - glucose 498  Redrew labs today & running STAT

## 2019-08-09 NOTE — Telephone Encounter (Signed)
Glucose 550 this morning

## 2019-08-10 ENCOUNTER — Telehealth: Payer: Self-pay

## 2019-08-10 ENCOUNTER — Ambulatory Visit: Payer: Medicaid Other | Admitting: Adult Health

## 2019-08-10 ENCOUNTER — Other Ambulatory Visit: Payer: Self-pay

## 2019-08-10 ENCOUNTER — Encounter: Payer: Self-pay | Admitting: Adult Health

## 2019-08-10 DIAGNOSIS — U071 COVID-19: Secondary | ICD-10-CM | POA: Diagnosis not present

## 2019-08-10 DIAGNOSIS — J439 Emphysema, unspecified: Secondary | ICD-10-CM

## 2019-08-10 DIAGNOSIS — J1282 Pneumonia due to coronavirus disease 2019: Secondary | ICD-10-CM

## 2019-08-10 DIAGNOSIS — I5032 Chronic diastolic (congestive) heart failure: Secondary | ICD-10-CM

## 2019-08-10 DIAGNOSIS — E119 Type 2 diabetes mellitus without complications: Secondary | ICD-10-CM | POA: Diagnosis not present

## 2019-08-10 DIAGNOSIS — M069 Rheumatoid arthritis, unspecified: Secondary | ICD-10-CM

## 2019-08-10 DIAGNOSIS — A4902 Methicillin resistant Staphylococcus aureus infection, unspecified site: Secondary | ICD-10-CM

## 2019-08-10 NOTE — Telephone Encounter (Signed)
PATIENT IS COMMUNICATING W PCP

## 2019-08-10 NOTE — Progress Notes (Signed)
@Patient  ID: , female    DOB: 07-20-1960, 59 y.o.   MRN: [id number]  Chief Complaint  Patient presents with  . Follow-up    Pulmonary Fibrosis     Referring provider: Care, [place] [facility]  HPI: 59 year old female former smoker with multiple medical problems including rheumatoid arthritis on methotrexate consult during hospitalization December 2020 for COVID-19 pneumonia and acute respiratory failure-ARDS (prolonged hospitalization)  TEST/EVENTS :  CT chest July 09, 2019 - for PE is in both upper lobes and posteriorly in the lung bases. 2D echo December 2020 EF 7075%, grade 1 diastolic dysfunction Echo July 16, 2019 EF 60 to 65%, right ventricular systolic function is normal, no left atrial thrombus detected.  08/10/2019 Follow up : Post Covid Fibrosis ,  Patient returns for a post hospital follow-up.  Patient has had a very complicated medical history over the last 4 months.  She developed COVID-19 infection with multifocal pneumonia in December 2020.  She had a prolonged hospitalization with ARDS [facility] compensated congestive heart failure.  She was treated with diuresis remdesivir and Decadron.  Required high flow oxygen and was able to be just decreased on 4 L..  Patient says overall breathing is doing okay. At rest does okay , but activity she has low tolerance. o2 at 2l/m rest and 4l/m with activity .  Is on pulmicort and duoneb nebs. Lost tubing , has not been doing .    Recently admitted last month for septic shock MRSA bacteremia.  Started on aggressive antibiotics.  Discharged on IV vancomycin for total of 6 weeks last dose being on Aug 20, 2019.  TEE was negative for vegetation.  Patient had a right arm PICC placed.  Was noted to have a splenic infarct.  Started on heparin with transition to Eliquis. Doing okay on current regimen . No known bleeding . Home Health RN twice weekly to check on PICC and IV .   Started on lasix 20mg  daily last ov , weight is down  30lbs. Still has lower extremity edema but is better. Less oxygen demands at rest . Can take oxygen off at rest at times with good sats >90% . Labs yesterday showed normal scr .   Has RA . Methotrexate is on hold. On Prednisone 5mg  daily .  Labs yesterday showed BS 550. Says lost flex pen. Going to pharmacy today to get replacement .   Allergies  Allergen Reactions  . Cephalexin Hives  . Codeine Palpitations, Nausea Only, Nausea And Vomiting, Rash and Shortness Of Breath    "makes heart fly, she gets flushed and passes out"  . Doxycycline Rash  . Propoxyphene Rash and Shortness Of Breath    Increase heart rate  . Sulfa Antibiotics Palpitations, Nausea Only, Shortness Of Breath and Hives    "makes heart fly, she gets flushed and passes out"  . Lovenox [Enoxaparin Sodium] Hives  . Hydrocodone Nausea And Vomiting    Hear racing & breaks out into a cold sweat.  . Meropenem Rash    Erythematous, hot, pruritic rash over arms, chest, back, abdomen, and face occurred at the end of meropenem infusion on 02/22/18    Immunization History  Administered Date(s) Administered  . Influenza, Seasonal, Injecte, Preservative Fre 02/04/2005  . Influenza,inj,Quad PF,6+ Mos 04/14/2015, 03/23/2017, 02/07/2018, 02/01/2019  . Influenza-Unspecified 02/02/2019  . Pneumococcal Conjugate-13 04/22/2014  . Pneumococcal Polysaccharide-23 03/23/2017, 04/16/2019    Past Medical History:  Diagnosis Date  . Abdominal wall hernia 01/29/2013  . Anxiety   .  Arthritis    Rheumatoid  . C. difficile colitis   . Chronic diastolic heart failure (HCC)   . COVID-19 03/23/2019   Diagnosed at [facility] (send-out) on 03/23/2019  . Depression   . Diabetes mellitus    states no meds or diet restrictions  at present  . Diastolic CHF (HCC)   . Esophagitis   . Fluid retention   . GERD (gastroesophageal reflux disease)   . Hiatal hernia   . Hypertension   . Hypokalemia due to loss of potassium 10/21/2015   Overview:  Associated  with 3 weeks of diarrhea  And QT prolongation.  . Hypothyroidism   . IBS (irritable bowel syndrome)   . Moderate episode of recurrent major depressive disorder (HCC) 06/03/2004  . Morbid obesity (HCC)   . MRSA (methicillin resistant Staphylococcus aureus) infection 11/2017   left inner thigh abcess  . Neurogenic bladder    has pacemaker  . Neuropathy   . Obesity   . Panic attacks   . Pneumonia due to COVID-19 virus   . Rheumatoid arthritis (HCC)   . Sleep apnea    STATES SEVERE, CANT TOLERATE MASK- LAST STUDY YEARS AGO    Tobacco History: Social History   Tobacco Use  Smoking Status Former Smoker  . Packs/day: 2.00  . Years: 27.00  . Pack years: 54.00  . Types: Cigarettes  . Quit date: 07/30/1999  . Years since quitting: 20.0  Smokeless Tobacco Never Used   Counseling given: Not Answered   Outpatient Medications Prior to Visit  Medication Sig Dispense Refill  . ALPRAZolam (XANAX) 0.5 MG tablet Take 1 tablet (0.5 mg total) by mouth 2 (two) times daily as needed for anxiety. 8 tablet 0  . apixaban (ELIQUIS) 5 MG TABS tablet Two tabs po twcie a day for 6 days then one tablet twice a day after that 72 tablet 0  . ARIPiprazole (ABILIFY) 5 MG tablet TAKE 1 TABLET BY MOUTH ONCE DAILY. (Patient taking differently: Take 5 mg by mouth daily. ) 28 tablet 11  . ascorbic acid (VITAMIN C) 500 MG tablet Take 1 tablet (500 mg total) by mouth daily. 30 tablet 0  . budesonide (PULMICORT) 0.5 MG/2ML nebulizer solution Take 2 mLs (0.5 mg total) by nebulization 2 (two) times daily. 120 mL 11  . busPIRone (BUSPAR) 10 MG tablet TAKE 1 TABLET BY MOUTH TWICE DAILY (Patient taking differently: Take 10 mg by mouth 2 (two) times daily. ) 56 tablet 11  . Cholecalciferol (VITAMIN D3) 125 MCG (5000 UT) CAPS Take 1 capsule (5,000 Units total) by mouth daily with breakfast. Take along with calcium and magnesium. 30 capsule 0  . Ensure Max Protein (ENSURE MAX PROTEIN) LIQD Take 330 mLs (11 oz total) by mouth  2 (two) times daily between meals. 10000 mL 0  . famotidine (PEPCID) 20 MG tablet Take 1 tablet (20 mg total) by mouth daily. 30 tablet 0  . FLUoxetine (PROZAC) 20 MG capsule Take 1 capsule (20 mg total) by mouth daily. TAKE (1) CAPSULE BY MOUTH ONCE DAILY. (Patient taking differently: Take 20 mg by mouth daily. ) 30 capsule 6  . folic acid (FOLVITE) 1 MG tablet Take 1 mg by mouth daily.     . furosemide (LASIX) 20 MG tablet Take 1 tablet (20 mg total) by mouth daily as needed. 30 tablet 1  . GNP CALCIUM CITRATE+D MAXIMUM 315-250 MG-UNIT TABS Take 1 tablet by mouth daily.    . insulin degludec (TRESIBA FLEXTOUCH) 200 UNIT/ML FlexTouch Pen  Inject 22 Units into the skin in the morning and at bedtime.    . insulin lispro (HUMALOG) 100 UNIT/ML injection Inject 0.08 mLs (8 Units total) into the skin 3 (three) times daily before meals. 10 mL 11  . ipratropium-albuterol (DUONEB) 0.5-2.5 (3) MG/3ML SOLN Take 3 mLs by nebulization 3 (three) times daily. 360 mL 0  . levothyroxine (SYNTHROID, LEVOTHROID) 88 MCG tablet Take 88 mcg by mouth daily before breakfast.    . polyethylene glycol (MIRALAX / GLYCOLAX) 17 g packet Take 17 g by mouth daily as needed for mild constipation. 14 each 0  . potassium chloride 20 MEQ TBCR Take 20 mEq by mouth daily. 30 tablet 1  . pramipexole (MIRAPEX) 0.125 MG tablet Take 0.25 mg by mouth at bedtime.     . predniSONE (DELTASONE) 10 MG tablet 3 tabs po day1; 2 tabs po day2; 1 tab po day3,4; 1/2 tab po day5,6,7,8 9 tablet 0  . pregabalin (LYRICA) 150 MG capsule Take 1 capsule (150 mg total) by mouth at bedtime. 30 capsule 5  . QUEtiapine (SEROQUEL) 300 MG tablet TAKE ONE TABLET BY MOUTH AT BEDTIME. (Patient taking differently: Take 300 mg by mouth at bedtime. TAKE ONE TABLET BY MOUTH AT BEDTIME.) 28 tablet 11  . vancomycin IVPB Inject 1,750 mg into the vein daily. Indication:  MRSA Bactermia with splenic infarcts Last Day of Therapy:  08/20/2019 [facility] Per Protocol: Aim for  Vancomycin Trough of 15-20 Labs weekly Monday while on IV antibiotics: _X_ CBC with differential _X_CMP _X__ Vancomycin trough  Labs every Thursday while on antibiotics X BMP X Vancomycin trough  _X_ Please pull PIC at completion of IV antibiotics _ Fax weekly labs to Dr.Ravishankar [phone] Clinic Follow Up Appt:4 weeks  Call [phone] to make appt 34 Units 0  . zolpidem (AMBIEN) 5 MG tablet Take 1 tablet (5 mg total) by mouth at bedtime. TAKE 1 TABLET BY MOUTH EVERYDAY AT BEDTIME (Patient taking differently: Take 5 mg by mouth at bedtime. ) 30 tablet 5  . oxyCODONE (OXY IR/ROXICODONE) 5 MG immediate release tablet Take 1 tablet (5 mg total) by mouth every 6 (six) hours as needed for severe pain. Must last 30 days 120 tablet 0  . potassium chloride (KLOR-CON) 10 MEQ tablet Take 2 tablets (20 mEq total) by mouth daily for 3 days. 6 tablet 0   No facility-administered medications prior to visit.     Review of Systems:   Constitutional:   No  weight loss, night sweats,  Fevers, chills,  +fatigue, or  lassitude.  HEENT:   No headaches,  Difficulty swallowing,  Tooth/dental problems, or  Sore throat,                No sneezing, itching, ear ache, nasal congestion, post nasal drip,   CV:  No chest pain,  Orthopnea, PND, +swelling in lower extremities,  No anasarca, dizziness, palpitations, syncope.   GI  No heartburn, indigestion, abdominal pain, nausea, vomiting, diarrhea, change in bowel habits, loss of appetite, bloody stools.   Resp:    No chest wall deformity  Skin: no rash or lesions.  GU: no dysuria, change in color of urine, no urgency or frequency.  No flank pain, no hematuria   MS:  No joint pain or swelling.  No decreased range of motion.  No back pain.    Physical Exam  BP 138/70 (BP Location: Left Arm, Cuff Size: Large)   Pulse 89   Temp 97.7 F (36.5  C) (Temporal)   Ht 5\' 6"  (1.676 m)   Wt (!) 318 lb (144.2 kg)   LMP 04/20/2001   SpO2 99%    BMI 51.33 kg/m   GEN: A/Ox3; pleasant , NAD, obese , in wc on O2    HEENT:  Agency/AT,   , NOSE-clear, THROAT-clear, no lesions, no postnasal drip or exudate noted.   NECK:  Supple w/ fair ROM; no JVD; normal carotid impulses w/o bruits; no thyromegaly or nodules palpated; no lymphadenopathy.    RESP  Clear  P & A; w/o, wheezes/ rales/ or rhonchi. no accessory muscle use, no dullness to percussion  CARD:  RRR, no m/r/g, 1-2 + peripheral edema (decreased from last ov) , pulses intact, no cyanosis or clubbing. PICC line right upper extremity  GI:   Soft & nt; nml bowel sounds; no organomegaly or masses detected.   Musco: Warm bil, no deformities or joint swelling noted.   Neuro: alert, no focal deficits noted.    Skin: Warm, no lesions or rashes    Lab Results:  CBC    Component Value Date/Time   WBC 6.9 08/06/2019 0815   RBC 3.17 (L) 08/06/2019 0815   HGB 7.3 (L) 08/06/2019 0815   HGB 12.1 05/27/2014 1439   HCT 24.1 (L) 08/06/2019 0815   HCT 37.5 05/27/2014 1439   PLT 269 08/06/2019 0815   PLT 221 05/27/2014 1439   MCV 76.0 (L) 08/06/2019 0815   MCV 80 05/27/2014 1439   MCH 23.0 (L) 08/06/2019 0815   MCHC 30.3 08/06/2019 0815   RDW 17.6 (H) 08/06/2019 0815   RDW 17.3 (H) 05/27/2014 1439   LYMPHSABS 1.2 08/06/2019 0815   LYMPHSABS 1.6 12/03/2013 0511   MONOABS 0.5 08/06/2019 0815   MONOABS 0.5 12/03/2013 0511   EOSABS 0.0 08/06/2019 0815   EOSABS 0.0 12/03/2013 0511   BASOSABS 0.0 08/06/2019 0815   BASOSABS 0.1 12/03/2013 0511    BMET    Component Value Date/Time   NA 136 08/09/2019 0840   NA 134 11/16/2018 1539   NA 136 05/27/2014 1439   K 4.3 08/09/2019 0840   K 3.8 05/27/2014 1439   CL 98 08/09/2019 0840   CL 100 05/27/2014 1439   CO2 28 08/09/2019 0840   CO2 28 05/27/2014 1439   GLUCOSE 550 (HH) 08/09/2019 0840   GLUCOSE 270 (H) 05/27/2014 1439   BUN 19 08/09/2019 0840   BUN 13 11/16/2018 1539   BUN 6 (L) 05/27/2014 1439   CREATININE 0.90  08/09/2019 0840   CREATININE 0.83 05/27/2014 1439   CALCIUM 8.5 (L) 08/09/2019 0840   CALCIUM 8.5 05/27/2014 1439   GFRNONAA >60 08/09/2019 0840   GFRNONAA >60 05/27/2014 1439   GFRNONAA >60 12/03/2013 0511   GFRAA >60 08/09/2019 0840   GFRAA >60 05/27/2014 1439   GFRAA >60 12/03/2013 0511    BNP    Component Value Date/Time   BNP 95.0 08/02/2019 1502    ProBNP No results found for: PROBNP  Imaging: CT HEAD WO CONTRAST  Result Date: 07/14/2019 CLINICAL DATA:  Acute onset altered level of consciousness, neurologic deficit EXAM: CT HEAD WITHOUT CONTRAST TECHNIQUE: Contiguous axial images were obtained from the base of the skull through the vertex without intravenous contrast. COMPARISON:  07/09/2019 FINDINGS: Brain: No acute infarct or hemorrhage. Lateral ventricles and midline structures are unremarkable. No acute extra-axial fluid collections. No mass effect. Vascular: No hyperdense vessel or unexpected calcification. Skull: Normal. Negative for fracture or focal lesion. Sinuses/Orbits: No acute  finding. Other: None IMPRESSION: 1. Stable head CT, no acute process. Electronically Signed   By: Sharlet Salina M.D.   On: 07/14/2019 19:27   CT ABDOMEN PELVIS W CONTRAST  Result Date: 07/14/2019 CLINICAL DATA:  Acute onset of abdominal pain. Altered mental status. EXAM: CT ABDOMEN AND PELVIS WITH CONTRAST TECHNIQUE: Multidetector CT imaging of the abdomen and pelvis was performed using the standard protocol following bolus administration of intravenous contrast. CONTRAST:  OMNIPAQUE IOHEXOL 300 MG/ML  SOLN COMPARISON:  Chest and abdominal CT 6 days ago 07/08/2019. Chest CTA 07/09/2019. FINDINGS: Lower chest: Small to moderate left pleural effusion with adjacent compressive atelectasis, new from prior imaging. Mild cardiomegaly. Prominent right infrahilar node which was seen on prior exam. Hepatobiliary: Mild hepatomegaly with liver measuring 20.9 cm cranial caudal. No focal lesion. Clips  in the gallbladder fossa postcholecystectomy. No biliary dilatation. Pancreas: No ductal dilatation or inflammation. Spleen: Enlarged spanning 16.6 cm AP. Multiple splenic hypodensities are better defined than on prior exam and most consistent with splenic infarcts. Splenic vein is patent. Adrenals/Urinary Tract: Normal adrenal glands. No hydronephrosis. Bilateral renal parenchymal thinning. Mild symmetric perinephric edema. No significant excretion on delayed phase imaging suggesting underlying renal dysfunction. Tiny hypodensity in the mid posterior left kidney is too small to characterize but likely small cyst. Urinary bladder is decompressed by Foley catheter. Stomach/Bowel: Enteric tube in the stomach. Stomach mildly distended with air and ingested contents/contrast. Normal positioning of the ligament of Treitz. No small bowel obstruction or inflammation. Administered enteric contrast reaches the distal small bowel/cecum. Normal appendix. Moderate stool in the ascending colon. Transverse colon is mildly distended with air. Liquid and solid stool in the descending and sigmoid colon. Sigmoid colon is tortuous. Decreased rectal stool burden from previous. No colonic inflammation. Vascular/Lymphatic: Aortic atherosclerosis without aneurysm. The portal and splenic veins are patent. Few prominent left external iliac and inguinal nodes are again seen, unchanged from prior. No new or progressive adenopathy. Reproductive: Status post hysterectomy. No adnexal masses. Other: Small volume perihepatic and perisplenic ascites, new from prior exam. Minimal free fluid tracks into the pelvis. Right lateral abdominal wall hernia contains small amount of fluid/ascites, similar to prior exam. No bowel involvement. No free air or intra-abdominal abscess. Pre sacral stimulator with battery pack in the left gluteal soft tissues. Generalized subcutaneous edema in the flanks, slightly increased. Musculoskeletal: There are no acute or  suspicious osseous abnormalities. Again seen degenerative change in the lower lumbar spine. IMPRESSION: 1. Small to moderate left pleural effusion with adjacent compressive atelectasis, new from recent chest CT. Small volume of perihepatic and perisplenic ascites, also new. 2. Splenomegaly with multiple splenic infarcts, better defined than on prior exam. 3. Improved bilateral perinephric edema, mild symmetric residual persists. Absent excretion on delayed phase imaging can be seen with underlying renal dysfunction. 4. Right lateral abdominal wall hernia containing small amount of fluid/ascites, similar to prior. No bowel involvement. 5. Additional chronic findings are stable. Aortic Atherosclerosis (ICD10-I70.0). Electronically Signed   By: Narda Rutherford M.D.   On: 07/14/2019 19:34   DG Chest Portable 1 View  Result Date: 08/02/2019 CLINICAL DATA:  Shortness of breath with loss of smell EXAM: PORTABLE CHEST 1 VIEW COMPARISON:  July 08, 2019 chest radiograph and July 09, 2019 CT angiogram chest FINDINGS: There remains somewhat diffuse interstitial thickening throughout the lungs without frank airspace opacity. No appreciable pleural effusions. Heart upper normal in size with pulmonary vascularity normal. No bone lesions. IMPRESSION: Diffuse interstitial thickening throughout the lungs without  frank airspace opacity. Question developing fibrosis bilaterally. A degree of residua from atypical organism pneumonia is quite possible. No consolidation or well-defined airspace opacity on this study. Heart upper normal in size.  No adenopathy evident. Electronically Signed   By: Bretta Bang III M.D.   On: 08/02/2019 17:54   DG Abd 2 Views  Result Date: 07/13/2019 CLINICAL DATA:  Abdominal pain EXAM: ABDOMEN - 2 VIEW COMPARISON:  02/24/2013 FINDINGS: Scattered large and small bowel gas is noted. Mild small bowel prominence is noted. InterStim device is noted in the left hemipelvis. No free air is seen.  IMPRESSION: Scattered large and small bowel gas with mild small bowel dilatation. This may represent a small bowel ileus. No definitive obstructive changes are seen. Electronically Signed   By: Alcide Clever M.D.   On: 07/13/2019 14:47   DG Abd Portable 2V  Result Date: 07/14/2019 CLINICAL DATA:  Abdominal pain. EXAM: PORTABLE ABDOMEN - 2 VIEW COMPARISON:  Abdominal radiograph 07/13/2019 FINDINGS: Monitoring leads overlie the patient. Enteric tube projects over the stomach. Redemonstrated gaseous distended loops of small and large bowel throughout the abdomen. Lumbar spine degenerative changes. IMPRESSION: Re demonstrated gaseous distended loops of small and large bowel favored to represent ileus. Electronically Signed   By: Annia Belt M.D.   On: 07/14/2019 12:53   ECHO TEE  Result Date: 07/16/2019    TRANSESOPHOGEAL ECHO REPORT   Patient Name:   [name] [name] Date of Exam: 07/16/2019 Medical Rec #:  [id number]       Height:       70.0 in Accession #:    [id number]      Weight:       350.0 lb Date of Birth:  1960-07-01       BSA:          2.648 m Patient Age:    58 years        BP:           Not listed in chart/Not listed in                                               chart mmHg Patient Gender: F               HR:           68 bpm. Exam Location:  [facility] Procedure: Transesophageal Echo, Cardiac Doppler and Color Doppler Indications:     Bacteremia 790.7  History:         Patient has prior history of Echocardiogram examinations, most                  recent 07/08/2019. Risk Factors:Diabetes. Diastloic CHF.  Sonographer:     Cristela Blue RDCS (AE) Referring Phys:  [id number] [facility] Diagnosing Phys: Debbe Odea MD PROCEDURE: The transesophogeal probe was passed without difficulty through the esophogus of the patient. Sedation performed by different physician. The patient developed no complications during the procedure. IMPRESSIONS  1. Left ventricular ejection fraction, by estimation, is 60 to  65%. The left ventricle has normal function. The left ventricle has no regional wall motion abnormalities. Left ventricular diastolic function could not be evaluated.  2. Right ventricular systolic function is normal. The right ventricular size is normal.  3. No left atrial/left atrial appendage thrombus was detected. The LAA emptying velocity was 80  cm/s.  4. The mitral valve is normal in structure. Trivial mitral valve regurgitation.  5. The aortic valve is tricuspid. Aortic valve regurgitation is not visualized. Conclusion(s)/Recommendation(s): No evidence of vegetation/infective endocarditis on this transesophageal echocardiogram. FINDINGS  Left Ventricle: Left ventricular ejection fraction, by estimation, is 60 to 65%. The left ventricle has normal function. The left ventricle has no regional wall motion abnormalities. The left ventricular internal cavity size was normal in size. There is  no left ventricular hypertrophy. Left ventricular diastolic function could not be evaluated. Right Ventricle: The right ventricular size is normal. No increase in right ventricular wall thickness. Right ventricular systolic function is normal. Left Atrium: Left atrial size was normal in size. No left atrial/left atrial appendage thrombus was detected. The LAA emptying velocity was 80 cm/s. Right Atrium: Right atrial size was normal in size. Pericardium: There is no evidence of pericardial effusion. Mitral Valve: The mitral valve is normal in structure. Trivial mitral valve regurgitation. Tricuspid Valve: The tricuspid valve is normal in structure. Tricuspid valve regurgitation is not demonstrated. Aortic Valve: The aortic valve is tricuspid. Aortic valve regurgitation is not visualized. Pulmonic Valve: The pulmonic valve was normal in structure. Pulmonic valve regurgitation is not visualized. Aorta: The aortic root is normal in size and structure. Venous: The inferior vena cava was not well visualized. IAS/Shunts: No atrial  level shunt detected by color flow Doppler. Debbe Odea MD Electronically signed by Debbe Odea MD Signature Date/Time: 07/16/2019/1:11:51 PM    Final    [name] EKG SITE RITE  Result Date: 07/16/2019 If Site Rite image not attached, placement could not be confirmed due to current cardiac rhythm.  [name] EKG SITE RITE  Result Date: 07/15/2019 If [facility] image not attached, placement could not be confirmed due to current cardiac rhythm.     No flowsheet data found.  No results found for: NITRICOXIDE      Assessment & Plan:   COPD (chronic obstructive pulmonary disease) (HCC) Restart Pulmicort and DuoNeb.  Spirometry and DLCO on return  Chronic diastolic CHF (congestive heart failure) (HCC) Significant improvement in lower extremity edema with weight loss.  Continue on Lasix.   Pneumonia due to COVID-19 virus Clinically improved.  Will need continued follow-up chest x-ray and possible CT scans.  Spirometry with DLCO on return  Type 2 diabetes mellitus without complication, without long-term current use of insulin (HCC) Severe diabetes.  Patient is to follow-up with her endocrinologist/primary care provider.  She has been out of her insulin pens.  Have advised her to go by the pharmacy today immediately as her blood sugars were very elevated yesterday.  Patient advised of the severe potential complications of hyperglycemia.  Rheumatoid arthritis Continue follow-up with rheumatology.  Continue on prednisone 5 mg daily.  Methotrexate is on hold.  MRSA (methicillin resistant Staphylococcus aureus) infection MRSA bacteremia.  Patient is continue on vancomycin IV via PICC line as directed via infectious disease.  Follow-up with ID clinic as planned     Rubye Oaks, NP 08/10/2019

## 2019-08-10 NOTE — Telephone Encounter (Signed)
Can you call patient and tell her that her sugar is 550 and she needs to check it again and let her PCP know. Thx  Called and patient reports that she is checking BS and in contact with PCP.

## 2019-08-10 NOTE — Assessment & Plan Note (Signed)
Continue follow-up with rheumatology.  Continue on prednisone 5 mg daily.  Methotrexate is on hold.

## 2019-08-10 NOTE — Assessment & Plan Note (Signed)
Significant improvement in lower extremity edema with weight loss.  Continue on Lasix.

## 2019-08-10 NOTE — Assessment & Plan Note (Signed)
Clinically improved.  Will need continued follow-up chest x-ray and possible CT scans.  Spirometry with DLCO on return

## 2019-08-10 NOTE — Assessment & Plan Note (Signed)
Severe diabetes.  Patient is to follow-up with her endocrinologist/primary care provider.  She has been out of her insulin pens.  Have advised her to go by the pharmacy today immediately as her blood sugars were very elevated yesterday.  Patient advised of the severe potential complications of hyperglycemia.

## 2019-08-10 NOTE — Patient Instructions (Addendum)
Restart Pulmicort nebulizer twice daily Restart DuoNeb nebulizer 3 times daily Go to pharmacy today as soon as you leave and get insulin pens . Low-salt diet Keep legs elevated Continue on oxygen 2  L at rest and 4 L with activity O2 saturation goal greater than 88 to 90% Activity as tolerated Continue on Eliquis.  Weigh daily and keep log.  POC order.  Follow-up in 4-6  week with Dr. Marchelle Gearing with Spirometry w/ DLCO .  Please contact office for sooner follow up if symptoms do not improve or worsen or seek emergency care

## 2019-08-10 NOTE — Progress Notes (Signed)
Patient: [name] [name]  Service Category: E/M  Provider: Gaspar Cola, MD  DOB: [birth year]  DOS: 08/13/2019  Location: Office  MRN: [id number]  Setting: Ambulatory outpatient  Referring Provider: [facility]  Type: Established Patient  Specialty: Interventional Pain Management  PCP: [facility]  Location: Remote location  Delivery: TeleHealth     Virtual Encounter - Pain Management PROVIDER NOTE: Information contained herein reflects review and annotations entered in association with encounter. Interpretation of such information and data should be left to medically-trained personnel. Information provided to patient can be located elsewhere in the medical record under "Patient Instructions". Document created using STT-dictation technology, any transcriptional errors that may result from process are unintentional.    Contact & Pharmacy Preferred: [phone] Home: [phone] (home) Mobile: [phone] (mobile) E-mail: [email or web address]  [place], [place] - [address] [place] [address] Phone: [phone] Fax: [phone]   Pre-screening  [name] offered "in-person" vs "virtual" encounter. She indicated preferring virtual for this encounter.   Reason COVID-19*  Social distancing based on CDC and AMA recommendations.   I contacted JERRILYNN MIKOWSKI on 08/13/2019 via telephone.      I clearly identified myself as Gaspar Cola, MD. I verified that I was speaking with the correct person using two identifiers (Name: [name] [name], and date of birth: 21-Oct-1960).  Consent I sought verbal advanced consent from [name] for virtual visit interactions. I informed Ms. Turrubiates of possible security and privacy concerns, risks, and limitations associated with providing "not-in-person" medical evaluation and management services. I also informed Ms. Fedorchak of the availability of "in-person" appointments.  Finally, I informed her that there would be a charge for the virtual visit and that she could be  personally, fully or partially, financially responsible for it. Ms. Levandowski expressed understanding and agreed to proceed.   Historic Elements   [name] is a 59 y.o. year old, female patient evaluated today after her last contact with our practice on 05/11/2019. Ms. Kyllonen  has a past medical history of Abdominal wall hernia (01/29/2013), Anxiety, Arthritis, C. difficile colitis, Chronic diastolic heart failure ([place]), COVID-19 (03/23/2019), Depression, Diabetes mellitus, Diastolic CHF ([place]), Esophagitis, Fluid retention, GERD (gastroesophageal reflux disease), Hiatal hernia, Hypertension, Hypokalemia due to loss of potassium (10/21/2015), Hypothyroidism, IBS (irritable bowel syndrome), Moderate episode of recurrent major depressive disorder ([place]) (06/03/2004), Morbid obesity ([place]), MRSA (methicillin resistant Staphylococcus aureus) infection (11/2017), Neurogenic bladder, Neuropathy, Obesity, Panic attacks, Pneumonia due to COVID-19 virus, Rheumatoid arthritis ([place]), and Sleep apnea. She also  has a past surgical history that includes Tubal ligation; Tonsillectomy; Cholecystectomy; Abdominal hysterectomy; Laparoscopic gastric banding (03/20/07); Eye surgery; Hernia repair; DG GREAT TOE RIGHT FOOT (02/23/2018); and TEE without cardioversion (N/A, 07/16/2019). [name] has a current medication list which includes the following prescription(s): alprazolam, apixaban, aripiprazole, ascorbic acid, budesonide, buspirone, vitamin d3, ensure max protein, famotidine, fluoxetine, folic acid, furosemide, gnp calcium citrate+d maximum, tresiba flextouch, insulin lispro, ipratropium-albuterol, levothyroxine, oxycodone, [START ON 09/12/2019] oxycodone, [START ON 10/12/2019] oxycodone, polyethylene glycol, potassium chloride er, pramipexole, prednisone, pregabalin, quetiapine, vancomycin, zolpidem, and potassium chloride. She   reports that she quit smoking about 20 years ago. Her smoking use included cigarettes. She has a 54.00 pack-year smoking history. She has never used smokeless tobacco. She reports that she does not drink alcohol or use drugs. Ms. Seivert is allergic to cephalexin; codeine; doxycycline; propoxyphene; sulfa antibiotics; lovenox [enoxaparin sodium]; hydrocodone; and meropenem.   HPI  Today, she is being contacted for medication management.  The patient appears to have had an episode of acute congestive heart failure as well as respiratory failure due to Covid infection. Today were contacting the patient to clarify her current use of oxycodone and pregabalin. There is a prescription on epic for pregabalin 150 mg at bedtime from another physician.  However the PMP reveals that her last prescription filled for the pregabalin was mine.  This also needs to be clarified by the patient.  The patient indicated having been in the hospital secondary to this Covid infection as well as having had sepsis.  She also indicates taking the Lyrica 3 times a day and the oxycodone 4 times a day.  When she was asked as to how many doses she had left, she indicated that she has about 4.  She indicated that she has been using some of the medication that she had accumulated over time, which brings [name] to the issue of hoarding medications, which we will address with the patient on her next visit.  Today I have informed the patient that the next visit will be a face-to-face visit.    I took the opportunity to explain the concept of tolerance and how "Drug Holidays" help control it.  I detailed how to do a slow taper to avoid withdrawals, in the event that she decided to proceed with a "Drug Holiday".   Pharmacotherapy Assessment  Analgesic: Oxycodone IR5 mg, 1 tab PO q 6hrs (14m/dayof oxycodone) MME/day:377mday.   Monitoring: [place] PMP: PDMP reviewed during this encounter.       Pharmacotherapy: No side-effects or adverse  reactions reported. Compliance: No problems identified. Effectiveness: Clinically acceptable. Plan: Refer to "POC".  UDS:  Summary  Date Value Ref Range Status  11/16/2018 Note  Final    Comment:    ==================================================================== [employer] Select 13 (MW) ==================================================================== Test                             Result       Flag       Units Drug Absent but Declared for Prescription Verification   Oxycodone                      Not Detected UNEXPECTED ng/mg creat ==================================================================== Test                      Result    Flag   Units      Ref Range   Creatinine              71               mg/dL      >=20 ==================================================================== Declared Medications:  The flagging and interpretation on this report are based on the  following declared medications.  Unexpected results may arise from  inaccuracies in the declared medications.  **Note: The testing scope of this panel includes these medications:  Oxycodone  **Note: The testing scope of this panel does not include the  following reported medications:  Aripiprazole (Abilify)  Atorvastatin (Lipitor)  Betamethasone (Lotrisone)  Buspirone (Buspar)  Calcium  Cephalexin (Keflex)  Cholecalciferol  Clotrimazole (Lotrisone)  Diclofenac  Fluoxetine (Prozac)  Folic Acid  Hydroxychloroquine (Plaquenil)  Hydroxyzine  Insulin  Leflunomide (Arava)  Levothyroxine  Lisinopril  Loperamide  Lovastatin  Melatonin  Metformin  Methotrexate  Phenazopyridine (Pyridium)  Potassium  Pregabalin (Lyrica)  Promethazine (Phenergan)  Quetiapine (Seroquel)  Vitamin D2 (Ergocalciferol)  Zolpidem (Ambien) ==================================================================== For clinical consultation, please call  [phone]. ====================================================================    Laboratory Chemistry Profile   Renal Lab Results  Component Value Date   BUN 19 08/09/2019   CREATININE 0.90 08/09/2019   BCR 16 11/16/2018   GFRAA >60 08/09/2019   GFRNONAA >60 08/09/2019     Hepatic Lab Results  Component Value Date   AST 11 (L) 08/06/2019   ALT 11 08/06/2019   ALBUMIN 2.6 (L) 08/06/2019   ALKPHOS 63 08/06/2019   LIPASE 176 (H) 07/13/2019     Electrolytes Lab Results  Component Value Date   NA 136 08/09/2019   K 4.3 08/09/2019   CL 98 08/09/2019   CALCIUM 8.5 (L) 08/09/2019   MG 1.5 (L) 08/02/2019   PHOS 3.3 07/17/2019     Bone Lab Results  Component Value Date   VD25OH 37.36 04/30/2019   25OHVITD1 23 (L) 11/16/2018   25OHVITD2 2.2 11/16/2018   25OHVITD3 21 11/16/2018     Inflammation (CRP: Acute Phase) (ESR: Chronic Phase) Lab Results  Component Value Date   CRP 2.1 (H) 07/17/2019   ESRSEDRATE 65 (H) 07/17/2019   LATICACIDVEN 1.6 07/10/2019       Note: Above Lab results reviewed.  Imaging  DG Chest Portable 1 View CLINICAL DATA:  Shortness of breath with loss of smell  EXAM: PORTABLE CHEST 1 VIEW  COMPARISON:  July 08, 2019 chest radiograph and July 09, 2019 CT angiogram chest  FINDINGS: There remains somewhat diffuse interstitial thickening throughout the lungs without frank airspace opacity. No appreciable pleural effusions. Heart upper normal in size with pulmonary vascularity normal. No bone lesions.  IMPRESSION: Diffuse interstitial thickening throughout the lungs without frank airspace opacity. Question developing fibrosis bilaterally. A degree of residua from atypical organism pneumonia is quite possible. No consolidation or well-defined airspace opacity on this study.  Heart upper normal in size.  No adenopathy evident.  Electronically Signed   By: Lowella Grip III M.D.   On: 08/02/2019 17:54  Assessment  The  primary encounter diagnosis was Chronic pain syndrome. Diagnoses of Chronic foot pain (Primary Area of Pain) (Bilateral) (L>R), Chronic wrist pain (Secondary area of Pain) (Bilateral) (L>R), Chronic elbow pain (Third area of Pain) (Bilateral) (L>R), Diabetic peripheral neuropathy (HCC), Neurogenic pain, and Neuropathic pain were also pertinent to this visit.  Plan of Care  Problem-specific:  No problem-specific Assessment & Plan notes found for this encounter.  Ms. NATHAN STALLWORTH has a current medication list which includes the following long-term medication(s): oxycodone, [START ON 09/12/2019] oxycodone, [START ON 10/12/2019] oxycodone, pregabalin, apixaban, aripiprazole, budesonide, famotidine, fluoxetine, furosemide, gnp calcium citrate+d maximum, insulin lispro, ipratropium-albuterol, potassium chloride, potassium chloride er, quetiapine, and zolpidem.  Pharmacotherapy (Medications Ordered): Meds ordered this encounter  Medications  . oxyCODONE (OXY IR/ROXICODONE) 5 MG immediate release tablet    Sig: Take 1 tablet (5 mg total) by mouth every 6 (six) hours as needed for severe pain. Must last 30 days    Dispense:  120 tablet    Refill:  0    Chronic Pain: STOP Act (Not applicable) Fill 1 day early if closed on refill date. Do not fill until: 08/13/2019. To last until: 09/12/2019. Avoid benzodiazepines within 8 hours of opioids  . oxyCODONE (OXY IR/ROXICODONE) 5 MG immediate release tablet    Sig: Take 1 tablet (5 mg total) by mouth every 6 (six) hours as needed for severe pain. Must last 30 days  Dispense:  120 tablet    Refill:  0    Chronic Pain: STOP Act (Not applicable) Fill 1 day early if closed on refill date. Do not fill until: 09/12/2019. To last until: 10/12/2019. Avoid benzodiazepines within 8 hours of opioids  . oxyCODONE (OXY IR/ROXICODONE) 5 MG immediate release tablet    Sig: Take 1 tablet (5 mg total) by mouth every 6 (six) hours as needed for severe pain. Must last 30 days     Dispense:  120 tablet    Refill:  0    Chronic Pain: STOP Act (Not applicable) Fill 1 day early if closed on refill date. Do not fill until: 10/12/2019. To last until: 11/11/2019. Avoid benzodiazepines within 8 hours of opioids  . pregabalin (LYRICA) 150 MG capsule    Sig: Take 1 capsule (150 mg total) by mouth 3 (three) times daily.    Dispense:  90 capsule    Refill:  5    Fill one day early if pharmacy is closed on scheduled refill date. May substitute for generic if available.   Orders:  No orders of the defined types were placed in this encounter.  Follow-up plan:   Return in about 3 months (around 11/07/2019) for (F2F), (MM).      Interventional therapies: Planned, scheduled, and/or pending:   Not at this time.   Considering:   Diagnostic/therapeutic IV lidocaine infusions.  Diagnostic bilateral lumbar sympathetic block  Diagnostic bilateral intra-articular shoulder joint injection  Diagnostic bilateral intra-articular knee injections with local anesthetic and steroid   Diagnostic right-sided CESI  Diagnostic bilateral Cervicalfacet block  Possible bilateral cervical facet RFA.    Palliative PRN treatment(s):   Palliative IV lidocaine infusion      Recent Visits No visits were found meeting these conditions.  Showing recent visits within past 90 days and meeting all other requirements   Future Appointments No visits were found meeting these conditions.  Showing future appointments within next 90 days and meeting all other requirements   I discussed the assessment and treatment plan with the patient. The patient was provided an opportunity to ask questions and all were answered. The patient agreed with the plan and demonstrated an understanding of the instructions.  Patient advised to call back or seek an in-person evaluation if the symptoms or condition worsens.  Duration of encounter: 15 minutes.  Note by: Gaspar Cola, MD Date: 08/13/2019; Time: 8:58 AM

## 2019-08-10 NOTE — Assessment & Plan Note (Signed)
Restart Pulmicort and DuoNeb.  Spirometry and DLCO on return

## 2019-08-10 NOTE — Assessment & Plan Note (Signed)
MRSA bacteremia.  Patient is continue on vancomycin IV via PICC line as directed via infectious disease.  Follow-up with ID clinic as planned

## 2019-08-13 ENCOUNTER — Ambulatory Visit: Payer: Medicaid Other | Attending: Pain Medicine | Admitting: Pain Medicine

## 2019-08-13 ENCOUNTER — Other Ambulatory Visit: Payer: Self-pay

## 2019-08-13 ENCOUNTER — Other Ambulatory Visit
Admission: RE | Admit: 2019-08-13 | Discharge: 2019-08-13 | Disposition: A | Discharge status: Home / Self Care | Payer: Medicaid Other | Source: Ambulatory Visit | Attending: Infectious Diseases | Admitting: Infectious Diseases

## 2019-08-13 DIAGNOSIS — M792 Neuralgia and neuritis, unspecified: Secondary | ICD-10-CM

## 2019-08-13 DIAGNOSIS — G894 Chronic pain syndrome: Secondary | ICD-10-CM

## 2019-08-13 DIAGNOSIS — M79673 Pain in unspecified foot: Secondary | ICD-10-CM | POA: Diagnosis not present

## 2019-08-13 DIAGNOSIS — A4902 Methicillin resistant Staphylococcus aureus infection, unspecified site: Secondary | ICD-10-CM | POA: Diagnosis not present

## 2019-08-13 DIAGNOSIS — D735 Infarction of spleen: Secondary | ICD-10-CM | POA: Insufficient documentation

## 2019-08-13 DIAGNOSIS — E1142 Type 2 diabetes mellitus with diabetic polyneuropathy: Secondary | ICD-10-CM

## 2019-08-13 DIAGNOSIS — M25531 Pain in right wrist: Secondary | ICD-10-CM

## 2019-08-13 DIAGNOSIS — M25532 Pain in left wrist: Secondary | ICD-10-CM

## 2019-08-13 DIAGNOSIS — R7881 Bacteremia: Secondary | ICD-10-CM | POA: Insufficient documentation

## 2019-08-13 DIAGNOSIS — G8929 Other chronic pain: Secondary | ICD-10-CM

## 2019-08-13 DIAGNOSIS — M25529 Pain in unspecified elbow: Secondary | ICD-10-CM

## 2019-08-13 LAB — COMPREHENSIVE METABOLIC PANEL
ALT: 11 U/L (ref 0–44)
AST: 11 U/L — ABNORMAL LOW (ref 15–41)
Albumin: 2.9 g/dL — ABNORMAL LOW (ref 3.5–5.0)
Alkaline Phosphatase: 66 U/L (ref 38–126)
Anion gap: 8 (ref 5–15)
BUN: 14 mg/dL (ref 6–20)
CO2: 29 mmol/L (ref 22–32)
Calcium: 8.5 mg/dL — ABNORMAL LOW (ref 8.9–10.3)
Chloride: 99 mmol/L (ref 98–111)
Creatinine, Ser: 0.62 mg/dL (ref 0.44–1.00)
GFR calc Af Amer: >60 mL/min (ref >60)
GFR calc non Af Amer: >60 mL/min (ref >60)
Glucose, Bld: 305 mg/dL — ABNORMAL HIGH (ref 70–99)
Potassium: 3.5 mmol/L (ref 3.5–5.1)
Sodium: 136 mmol/L (ref 135–145)
Total Bilirubin: 0.6 mg/dL (ref 0.3–1.2)
Total Protein: 6 g/dL — ABNORMAL LOW (ref 6.5–8.1)

## 2019-08-13 LAB — CBC WITH DIFFERENTIAL/PLATELET
Abs Immature Granulocytes: 0.14 10*3/uL — ABNORMAL HIGH (ref 0.00–0.07)
Basophils Absolute: 0.1 10*3/uL (ref 0.0–0.1)
Basophils Relative: 1 %
Eosinophils Absolute: 0 10*3/uL (ref 0.0–0.5)
Eosinophils Relative: 0 %
HCT: 26.4 % — ABNORMAL LOW (ref 36.0–46.0)
Hemoglobin: 8.2 g/dL — ABNORMAL LOW (ref 12.0–15.0)
Immature Granulocytes: 2 %
Lymphocytes Relative: 22 %
Lymphs Abs: 1.9 10*3/uL (ref 0.7–4.0)
MCH: 24.2 pg — ABNORMAL LOW (ref 26.0–34.0)
MCHC: 31.1 g/dL (ref 30.0–36.0)
MCV: 77.9 fL — ABNORMAL LOW (ref 80.0–100.0)
Monocytes Absolute: 0.5 10*3/uL (ref 0.1–1.0)
Monocytes Relative: 5 %
Neutro Abs: 5.9 10*3/uL (ref 1.7–7.7)
Neutrophils Relative %: 70 %
Platelets: 263 10*3/uL (ref 150–400)
RBC: 3.39 MIL/uL — ABNORMAL LOW (ref 3.87–5.11)
RDW: 18.5 % — ABNORMAL HIGH (ref 11.5–15.5)
WBC: 8.4 10*3/uL (ref 4.0–10.5)
nRBC: 0 % (ref 0.0–0.2)

## 2019-08-13 LAB — VANCOMYCIN, TROUGH: Vancomycin Tr: 14 ug/mL — ABNORMAL LOW (ref 15–20)

## 2019-08-13 MED ORDER — PREGABALIN 150 MG PO CAPS: 150.0000 mg | ORAL_CAPSULE | Freq: Three times a day (TID) | ORAL | 5 refills | Status: DC

## 2019-08-13 MED ORDER — OXYCODONE HCL 5 MG PO TABS: 5.0000 mg | ORAL_TABLET | Freq: Four times a day (QID) | ORAL | 0 refills | Status: DC | PRN

## 2019-08-13 MED ORDER — ULTICARE PEN NEEDLE 31 GAUGE X 1/4" (6 MM)
0.00000 days
Start: 2019-08-13 — End: ?

## 2019-08-14 ENCOUNTER — Telehealth: Payer: Self-pay | Admitting: Adult Health

## 2019-08-14 NOTE — Telephone Encounter (Signed)
I called and spoke with the patient and made her aware that Im not sure who called her and that I did not see any notes. I advised her that I would call her back if I saw anything.

## 2019-08-15 MED ORDER — VESICARE 5 MG TABLET
ORAL_TABLET | 2 refills | 0 days | Status: CP
Start: 2019-08-15 — End: ?

## 2019-08-16 ENCOUNTER — Ambulatory Visit: Payer: Medicaid Other | Attending: Infectious Diseases | Admitting: Infectious Diseases

## 2019-08-16 ENCOUNTER — Other Ambulatory Visit
Admission: RE | Admit: 2019-08-16 | Discharge: 2019-08-16 | Disposition: A | Discharge status: Home / Self Care | Payer: Medicaid Other | Source: Ambulatory Visit | Attending: Infectious Diseases | Admitting: Infectious Diseases

## 2019-08-16 ENCOUNTER — Encounter: Payer: Self-pay | Admitting: Infectious Diseases

## 2019-08-16 ENCOUNTER — Other Ambulatory Visit: Payer: Self-pay

## 2019-08-16 VITALS — BP 155/94 | HR 81 | Temp 98.1°F | Resp 16 | Ht 66.0 in | Wt 318.0 lb

## 2019-08-16 DIAGNOSIS — S91109A Unspecified open wound of unspecified toe(s) without damage to nail, initial encounter: Secondary | ICD-10-CM | POA: Diagnosis not present

## 2019-08-16 DIAGNOSIS — B9562 Methicillin resistant Staphylococcus aureus infection as the cause of diseases classified elsewhere: Secondary | ICD-10-CM

## 2019-08-16 DIAGNOSIS — R7881 Bacteremia: Secondary | ICD-10-CM

## 2019-08-16 NOTE — Progress Notes (Signed)
NAME: [name] [name]  DOB: Jul 13, 1960  MRN: [id number]  Date/Time: 08/16/2019 11:18 AM   Subjective:  ?Here for follow-up after hospital discharge Here with her mom MAISON AGRUSA is a 59 y.o.  femle with a history of poorly controlled diabetes, rheumatoid arthritis, chronic pain syndrome, hypothyroidism, Covid pneumonia, COPD was recently hospitalized between 07/07/2019 until 07/17/2019 during which time she was diagnosed with MRSA bacteremia and also had splenic infarct.  On 07/09/2019 the repeat blood culture was negative for MRSA.  TEE was negative for endocarditis it was decided to treat her like infectious endocarditis with 6 weeks of IV antibiotics.  She was discharged on IV vancomycin on 07/17/2019. Patient has had uncontrolled diabetes under routine labs done every week shows blood sugars in 405 100.  As per patient that is her normal.  Have told her to communicate with her physicians multiple times. As per her mother patient does not have a proper eating schedule.    Patient has no fever. She has a new blister on her left great toe at the MTP area secondary to a sandal rubbing on the skin.  There is minimal erythema around it.   Patient has history of amputation of toes on the right foot in 2019 and she had MRSA following that.  03/29/2019 she had excision of the 1st metatarsal head the proximal bone culture had MRSA and she was treated with antibiotics.  On 08/02/2018 she had amputation of the right 2nd toe metatarsophalangeal joint.  On 5 7 she had reexcision of the 2nd right toe.  And had MRSA then. Past Medical History:  Diagnosis Date  . Abdominal wall hernia 01/29/2013  . Anxiety   . Arthritis    Rheumatoid  . C. difficile colitis   . Chronic diastolic heart failure ([place])   . COVID-19 03/23/2019   Diagnosed at [facility] (send-out) on 03/23/2019  . Depression   . Diabetes mellitus    states no meds or diet restrictions  at present  . Diastolic CHF ([place])   . Esophagitis   . Fluid  retention   . GERD (gastroesophageal reflux disease)   . Hiatal hernia   . Hypertension   . Hypokalemia due to loss of potassium 10/21/2015   Overview:  Associated with 3 weeks of diarrhea  And QT prolongation.  . Hypothyroidism   . IBS (irritable bowel syndrome)   . Moderate episode of recurrent major depressive disorder ([place]) 06/03/2004  . Morbid obesity ([place])   . MRSA (methicillin resistant Staphylococcus aureus) infection 11/2017   left inner thigh abcess  . Neurogenic bladder    has pacemaker  . Neuropathy   . Obesity   . Panic attacks   . Pneumonia due to COVID-19 virus   . Rheumatoid arthritis ([place])   . Sleep apnea    STATES SEVERE, CANT TOLERATE MASK- LAST STUDY YEARS AGO    Past Surgical History:  Procedure Laterality Date  . ABDOMINAL HYSTERECTOMY    . CHOLECYSTECTOMY    . DG GREAT TOE RIGHT FOOT  02/23/2018  . EYE SURGERY     bilateral cataract extraction with IOL  . HERNIA REPAIR     ventral hernia with strangulation  . LAPAROSCOPIC GASTRIC BANDING  03/20/07  . TEE WITHOUT CARDIOVERSION N/A 07/16/2019   Procedure: TRANSESOPHAGEAL ECHOCARDIOGRAM (TEE);  Surgeon: Kate Sable, MD;  Location: [facility] ORS;  Service: Cardiovascular;  Laterality: N/A;  . TONSILLECTOMY    . TUBAL LIGATION      Social History  Socioeconomic History  . Marital status: Divorced    Spouse name: Not on file  . Number of children: Not on file  . Years of education: Not on file  . Highest education level: Not on file  Occupational History  . Not on file  Tobacco Use  . Smoking status: Former Smoker    Packs/day: 2.00    Years: 27.00    Pack years: 54.00    Types: Cigarettes    Quit date: 07/30/1999    Years since quitting: 20.0  . Smokeless tobacco: Never Used  Substance and Sexual Activity  . Alcohol use: No  . Drug use: No  . Sexual activity: Not Currently  Other Topics Concern  . Not on file  Social History Narrative  . Not on file   Social Determinants of Health    Financial Resource Strain:   . Difficulty of Paying Living Expenses:   Food Insecurity:   . Worried About Programme researcher, broadcasting/film/video in the Last Year:   . Barista in the Last Year:   Transportation Needs:   . Freight forwarder (Medical):   Marland Kitchen Lack of Transportation (Non-Medical):   Physical Activity:   . Days of Exercise per Week:   . Minutes of Exercise per Session:   Stress:   . Feeling of Stress :   Social Connections:   . Frequency of Communication with Friends and Family:   . Frequency of Social Gatherings with Friends and Family:   . Attends Religious Services:   . Active Member of Clubs or Organizations:   . Attends Banker Meetings:   Marland Kitchen Marital Status:   Intimate Partner Violence:   . Fear of Current or Ex-Partner:   . Emotionally Abused:   Marland Kitchen Physically Abused:   . Sexually Abused:     Family History  Problem Relation Age of Onset  . Heart failure Father   . Bipolar disorder Father   . Alcohol abuse Father   . Anxiety disorder Father   . Depression Father   . Heart disease Brother   . Heart attack Brother 75       MI s/p stents placed  . Anxiety disorder Sister   . Depression Sister   . Anxiety disorder Sister   . Depression Sister   . Bipolar disorder Sister   . Alcohol abuse Sister   . Drug abuse Sister   . Heart attack Brother    Allergies  Allergen Reactions  . Cephalexin Hives  . Codeine Palpitations, Nausea Only, Nausea And Vomiting, Rash and Shortness Of Breath    "makes heart fly, she gets flushed and passes out"  . Doxycycline Rash  . Propoxyphene Rash and Shortness Of Breath    Increase heart rate  . Sulfa Antibiotics Palpitations, Nausea Only, Shortness Of Breath and Hives    "makes heart fly, she gets flushed and passes out"  . Lovenox [Enoxaparin Sodium] Hives  . Hydrocodone Nausea And Vomiting    Hear racing & breaks out into a cold sweat.  . Meropenem Rash    Erythematous, hot, pruritic rash over arms, chest,  back, abdomen, and face occurred at the end of meropenem infusion on 02/22/18  ? Current Outpatient Medications  Medication Sig Dispense Refill  . ALPRAZolam (XANAX) 0.5 MG tablet Take 1 tablet (0.5 mg total) by mouth 2 (two) times daily as needed for anxiety. 8 tablet 0  . apixaban (ELIQUIS) 5 MG TABS tablet Two tabs po twcie a  day for 6 days then one tablet twice a day after that 72 tablet 0  . ARIPiprazole (ABILIFY) 5 MG tablet TAKE 1 TABLET BY MOUTH ONCE DAILY. (Patient taking differently: Take 5 mg by mouth daily. ) 28 tablet 11  . ascorbic acid (VITAMIN C) 500 MG tablet Take 1 tablet (500 mg total) by mouth daily. 30 tablet 0  . budesonide (PULMICORT) 0.5 MG/2ML nebulizer solution Take 2 mLs (0.5 mg total) by nebulization 2 (two) times daily. 120 mL 11  . busPIRone (BUSPAR) 10 MG tablet TAKE 1 TABLET BY MOUTH TWICE DAILY (Patient taking differently: Take 10 mg by mouth 2 (two) times daily. ) 56 tablet 11  . Cholecalciferol (VITAMIN D3) 125 MCG (5000 UT) CAPS Take 1 capsule (5,000 Units total) by mouth daily with breakfast. Take along with calcium and magnesium. 30 capsule 0  . famotidine (PEPCID) 20 MG tablet Take 1 tablet (20 mg total) by mouth daily. 30 tablet 0  . FLUoxetine (PROZAC) 20 MG capsule Take 1 capsule (20 mg total) by mouth daily. TAKE (1) CAPSULE BY MOUTH ONCE DAILY. (Patient taking differently: Take 20 mg by mouth daily. ) 30 capsule 6  . folic acid (FOLVITE) 1 MG tablet Take 1 mg by mouth daily.     . furosemide (LASIX) 20 MG tablet Take 1 tablet (20 mg total) by mouth daily as needed. 30 tablet 1  . GNP CALCIUM CITRATE+D MAXIMUM 315-250 MG-UNIT TABS Take 1 tablet by mouth daily.    . insulin degludec (TRESIBA FLEXTOUCH) 200 UNIT/ML FlexTouch Pen Inject 22 Units into the skin in the morning and at bedtime.    . insulin lispro (HUMALOG) 100 UNIT/ML injection Inject 0.08 mLs (8 Units total) into the skin 3 (three) times daily before meals. 10 mL 11  . ipratropium-albuterol  (DUONEB) 0.5-2.5 (3) MG/3ML SOLN Take 3 mLs by nebulization 3 (three) times daily. 360 mL 0  . oxyCODONE (OXY IR/ROXICODONE) 5 MG immediate release tablet Take 1 tablet (5 mg total) by mouth every 6 (six) hours as needed for severe pain. Must last 30 days 120 tablet 0  . [START ON 09/12/2019] oxyCODONE (OXY IR/ROXICODONE) 5 MG immediate release tablet Take 1 tablet (5 mg total) by mouth every 6 (six) hours as needed for severe pain. Must last 30 days 120 tablet 0  . [START ON 10/12/2019] oxyCODONE (OXY IR/ROXICODONE) 5 MG immediate release tablet Take 1 tablet (5 mg total) by mouth every 6 (six) hours as needed for severe pain. Must last 30 days 120 tablet 0  . polyethylene glycol (MIRALAX / GLYCOLAX) 17 g packet Take 17 g by mouth daily as needed for mild constipation. 14 each 0  . potassium chloride 20 MEQ TBCR Take 20 mEq by mouth daily. 30 tablet 1  . pramipexole (MIRAPEX) 0.125 MG tablet Take 0.25 mg by mouth at bedtime.     . predniSONE (DELTASONE) 10 MG tablet 3 tabs po day1; 2 tabs po day2; 1 tab po day3,4; 1/2 tab po day5,6,7,8 9 tablet 0  . pregabalin (LYRICA) 150 MG capsule Take 1 capsule (150 mg total) by mouth 3 (three) times daily. 90 capsule 5  . QUEtiapine (SEROQUEL) 300 MG tablet TAKE ONE TABLET BY MOUTH AT BEDTIME. (Patient taking differently: Take 300 mg by mouth at bedtime. TAKE ONE TABLET BY MOUTH AT BEDTIME.) 28 tablet 11  . vancomycin IVPB Inject 1,750 mg into the vein daily. Indication:  MRSA Bactermia with splenic infarcts Last Day of Therapy:  08/20/2019 [facility]  Per Protocol: Aim for Vancomycin Trough of 15-20 Labs weekly Monday while on IV antibiotics: _X_ CBC with differential _X_CMP _X__ Vancomycin trough  Labs every Thursday while on antibiotics X BMP X Vancomycin trough  _X_ Please pull PIC at completion of IV antibiotics _ Fax weekly labs to Dr.Quentez Lober [phone] Clinic Follow Up Appt:4 weeks  Call [phone] to make appt 34 Units 0  . zolpidem  (AMBIEN) 5 MG tablet Take 1 tablet (5 mg total) by mouth at bedtime. TAKE 1 TABLET BY MOUTH EVERYDAY AT BEDTIME (Patient taking differently: Take 5 mg by mouth at bedtime. ) 30 tablet 5  . levothyroxine (SYNTHROID, LEVOTHROID) 88 MCG tablet Take 88 mcg by mouth daily before breakfast.    . potassium chloride (KLOR-CON) 10 MEQ tablet Take 2 tablets (20 mEq total) by mouth daily for 3 days. 6 tablet 0   No current facility-administered medications for this visit.     Abtx:  Anti-infectives (From admission, onward)   None      REVIEW OF SYSTEMS:  Const: Overall feeling better.  No fever no chills. Eyes: negative diplopia or visual changes, negative eye pain ENT: negative coryza, negative sore throat Resp: Dyspnea on minimal exertion. Cards: negative for chest pain, palpitations, lower extremity edema GU: negative for frequency, dysuria and hematuria GI: Negative for abdominal pain, diarrhea, bleeding, constipation Skin: negative for rash and pruritus Heme: negative for easy bruising and gum/nose bleeding MS generalized weakness. Neurolo:negative for headaches, dizziness, vertigo, memory problems  Psych: Anxiety depression  Endocrine: Poorly controlled diabetes Allergy/Immunology-as above Objective:  VITALS:  BP (!) 155/94   Pulse 81   Temp 98.1 F (36.7 C)   Resp 16   Ht 5\' 6"  (1.676 m)   Wt (!) 318 lb (144.2 kg)   LMP 04/20/2001   SpO2 93%   BMI 51.33 kg/m  PHYSICAL EXAM: Patient in wheelchair so a limited examination General: Alert, cooperative, no distress, appears stated age.  Head: Normocephalic, without obvious abnormality, atraumatic. Eyes: Conjunctivae clear, anicteric sclerae. Pupils are equal ENT did not examine because of mask Neck: Supple, symmetrical, no adenopathy, thyroid: non tender no carotid bruit and no JVD. Lungs: Clear to auscultation bilaterally. No Wheezing or Rhonchi. No rales. Heart:  S1-S2 Abdomen: Did not examine Extremities: Left foot At  the MTP joint area there is a small blister which is deroofed there are some surrounding erythema.  Minimal discharge.  Area cleaned and a culture taken.    Skin: Multiple tattoos   lymph: Cervical, supraclavicular normal. Neurologic: Grossly non-focal Pertinent Labs Lab Results CBC    Component Value Date/Time   WBC 8.4 08/13/2019 0845   RBC 3.39 (L) 08/13/2019 0845   HGB 8.2 (L) 08/13/2019 0845   HGB 12.1 05/27/2014 1439   HCT 26.4 (L) 08/13/2019 0845   HCT 37.5 05/27/2014 1439   PLT 263 08/13/2019 0845   PLT 221 05/27/2014 1439   MCV 77.9 (L) 08/13/2019 0845   MCV 80 05/27/2014 1439   MCH 24.2 (L) 08/13/2019 0845   MCHC 31.1 08/13/2019 0845   RDW 18.5 (H) 08/13/2019 0845   RDW 17.3 (H) 05/27/2014 1439   LYMPHSABS 1.9 08/13/2019 0845   LYMPHSABS 1.6 12/03/2013 0511   MONOABS 0.5 08/13/2019 0845   MONOABS 0.5 12/03/2013 0511   EOSABS 0.0 08/13/2019 0845   EOSABS 0.0 12/03/2013 0511   BASOSABS 0.1 08/13/2019 0845   BASOSABS 0.1 12/03/2013 0511    CMP Latest Ref Rng & Units 08/13/2019 08/09/2019 08/06/2019  Glucose  70 - 99 mg/dL 409(W) 119(JY) 782(N)  BUN 6 - 20 mg/dL 14 19 14   Creatinine 0.44 - 1.00 mg/dL 5.62 1.30  Sodium 135 - 145 mmol/L 136 136 138  Potassium 3.5 - 5.1 mmol/L 3.5 4.3 3.5  Chloride 98 - 111 mmol/L 99 98 101  CO2 22 - 32 mmol/L 29 28 28   Calcium 8.9 - 10.3 mg/dL 8.65) ) 7.8(I)  Total Protein 6.5 - 8.1 g/dL 6.0(L) - 5.9(L)  Total Bilirubin 0.3 - 1.2 mg/dL 0.6 - 0.4  Alkaline Phos 38 - 126 U/L 66 - 63  AST 15 - 41 U/L 11(L) - 11(L)  ALT 0 - 44 U/L 11 - 11    ? Impression/Recommendation ? ?MRSA bacteremia with splenic infarct is being treated as a deep-seated infection for 6 weeks with antibiotics.  She completed IV vancomycin on 08/20/2019. Her Vanco levels have fluctuated in the beginning but now it is stabilized.  She has normal creatinine. When she completes her antibiotic her PICC line can be removed.  Left great toe MTP area  pressure blister and wound.  Superficial.  Taken a culture after cleaning.  Will follow up with her if she needs oral antibiotics.  She needs to see a podiatrist.  Diabetes mellitus which is uncontrolled.  Has had sugars in the range of four hundreds and five hundreds.  She is on insulin.  Not sure how much compliant she is with her diet and medication.  Follow-up with her PCP.  COPD  Recent Covid infection with Covid pneumonia.  Much improved.  Rheumatoid arthritis was on methotrexate and is on hold now.  On prednisone.  Follow-up with rheumatologist.  Discussed the management with the patient and her mother in great detail ? _Patient is discharged from my clinic.  __________________________________________________  note:  This document was prepared using Dragon voice recognition software and may include unintentional dictation errors.

## 2019-08-16 NOTE — Patient Instructions (Signed)
You are here for follow up of MRSA abcteremia- you will complete IV on 08/20/19 and your pICC can be removed You have a wound on the left toe because of pressure from a sandal- took cultures- will contact you if you need any oral antibiotic Your sugar will have to be better controlled - you are running in 400s/500s and you say that is your normal and that should not be the case- please visit your endocrinologist and PCP

## 2019-08-18 LAB — AEROBIC CULTURE W GRAM STAIN (SUPERFICIAL SPECIMEN): Gram Stain: NONE SEEN

## 2019-08-20 ENCOUNTER — Other Ambulatory Visit: Payer: Self-pay | Admitting: Infectious Diseases

## 2019-08-20 MED ORDER — CIPROFLOXACIN HCL 500 MG PO TABS: 500.0000 mg | ORAL_TABLET | Freq: Two times a day (BID) | ORAL | 0 refills | Status: DC

## 2019-09-04 ENCOUNTER — Telehealth: Payer: Self-pay | Admitting: *Deleted

## 2019-09-04 NOTE — Telephone Encounter (Signed)
Patient called, no answer. Left message for patient to return call regarding repeat pneumonia vaccination.

## 2019-09-05 ENCOUNTER — Other Ambulatory Visit: Payer: Self-pay | Admitting: Adult Health

## 2019-09-05 ENCOUNTER — Ambulatory Visit
Admit: 2019-09-05 | Discharge: 2019-09-05 | Payer: MEDICAID | Attending: Foot & Ankle Surgery | Primary: Foot & Ankle Surgery

## 2019-09-05 DIAGNOSIS — L089 Local infection of the skin and subcutaneous tissue, unspecified: Principal | ICD-10-CM

## 2019-09-05 DIAGNOSIS — S90822A Blister (nonthermal), left foot, initial encounter: Principal | ICD-10-CM

## 2019-09-05 DIAGNOSIS — M79674 Pain in right toe(s): Principal | ICD-10-CM

## 2019-09-05 DIAGNOSIS — M79675 Pain in left toe(s): Secondary | ICD-10-CM

## 2019-09-05 DIAGNOSIS — B351 Tinea unguium: Principal | ICD-10-CM

## 2019-09-05 MED ORDER — ATORVASTATIN 80 MG TABLET
ORAL_TABLET | Freq: Every evening | ORAL | 1 refills | 90 days | Status: CP
Start: 2019-09-05 — End: 2020-09-04

## 2019-09-25 ENCOUNTER — Other Ambulatory Visit: Admission: RE | Admit: 2019-09-25 | Payer: Medicaid Other | Source: Ambulatory Visit

## 2019-09-25 ENCOUNTER — Other Ambulatory Visit: Payer: Self-pay | Admitting: Internal Medicine

## 2019-09-25 DIAGNOSIS — J439 Emphysema, unspecified: Secondary | ICD-10-CM

## 2019-09-25 NOTE — Progress Notes (Unsigned)
t

## 2019-09-26 ENCOUNTER — Ambulatory Visit: Payer: Medicaid Other | Admitting: Internal Medicine

## 2019-10-01 ENCOUNTER — Other Ambulatory Visit: Payer: Self-pay | Admitting: Adult Health

## 2019-10-01 MED ORDER — ATORVASTATIN 80 MG TABLET
ORAL_TABLET | 11 refills | 0 days
Start: 2019-10-01 — End: ?

## 2019-10-05 ENCOUNTER — Ambulatory Visit
Admit: 2019-10-05 | Discharge: 2019-10-06 | Payer: MEDICAID | Attending: Foot & Ankle Surgery | Primary: Foot & Ankle Surgery

## 2019-10-05 DIAGNOSIS — S90822A Blister (nonthermal), left foot, initial encounter: Principal | ICD-10-CM

## 2019-10-05 DIAGNOSIS — L089 Local infection of the skin and subcutaneous tissue, unspecified: Principal | ICD-10-CM

## 2019-10-05 DIAGNOSIS — M79674 Pain in right toe(s): Principal | ICD-10-CM

## 2019-10-05 DIAGNOSIS — B351 Tinea unguium: Principal | ICD-10-CM

## 2019-10-05 DIAGNOSIS — M79675 Pain in left toe(s): Principal | ICD-10-CM

## 2019-10-10 MED ORDER — ATORVASTATIN 80 MG TABLET
ORAL_TABLET | Freq: Every evening | ORAL | 1 refills | 90.00000 days | Status: CP
Start: 2019-10-10 — End: 2020-10-09

## 2019-10-10 MED ORDER — POTASSIUM CHLORIDE ER 20 MEQ TABLET,EXTENDED RELEASE(PART/CRYST)
ORAL_TABLET | 11 refills | 0 days
Start: 2019-10-10 — End: ?

## 2019-10-10 MED ORDER — METHOTREXATE SODIUM 2.5 MG TABLET
ORAL_TABLET | 11 refills | 0 days
Start: 2019-10-10 — End: ?

## 2019-10-11 MED ORDER — METHOTREXATE SODIUM 2.5 MG TABLET
ORAL_TABLET | ORAL | 0 refills | 84 days
Start: 2019-10-11 — End: 2019-11-10

## 2019-10-15 MED ORDER — CHOLECALCIFEROL (VITAMIN D3) 125 MCG (5,000 UNIT) CAPSULE
0.00000 days
Start: 2019-10-15 — End: ?

## 2019-10-18 MED ORDER — METHOTREXATE SODIUM 2.5 MG TABLET: 10 mg | tablet | 0 refills | 28 days | Status: AC

## 2019-10-18 MED ORDER — METHOTREXATE SODIUM 2.5 MG TABLET
ORAL_TABLET | ORAL | 0 refills | 84.00000 days | Status: CP
Start: 2019-10-18 — End: 2019-10-18

## 2019-10-22 ENCOUNTER — Ambulatory Visit
Admit: 2019-10-22 | Discharge: 2019-10-22 | Disposition: A | Discharge status: Home / Self Care | Payer: MEDICAID | Attending: Emergency Medicine

## 2019-10-22 ENCOUNTER — Emergency Department
Admit: 2019-10-22 | Discharge: 2019-10-22 | Disposition: A | Discharge status: Home / Self Care | Payer: MEDICAID | Attending: Emergency Medicine

## 2019-10-22 DIAGNOSIS — L97529 Non-pressure chronic ulcer of other part of left foot with unspecified severity: Principal | ICD-10-CM

## 2019-10-22 DIAGNOSIS — L97509 Non-pressure chronic ulcer of other part of unspecified foot with unspecified severity: Principal | ICD-10-CM

## 2019-10-22 DIAGNOSIS — E11621 Type 2 diabetes mellitus with foot ulcer: Principal | ICD-10-CM

## 2019-10-22 DIAGNOSIS — Z794 Long term (current) use of insulin: Principal | ICD-10-CM

## 2019-10-22 MED ORDER — CIPROFLOXACIN 500 MG TABLET
ORAL_TABLET | Freq: Two times a day (BID) | ORAL | 0 refills | 14 days | Status: CP
Start: 2019-10-22 — End: 2019-11-05

## 2019-10-22 MED ORDER — CLINDAMYCIN HCL 150 MG CAPSULE
ORAL_CAPSULE | Freq: Three times a day (TID) | ORAL | 0 refills | 14 days | Status: CP
Start: 2019-10-22 — End: 2019-11-05

## 2019-10-26 ENCOUNTER — Ambulatory Visit: Admit: 2019-10-26 | Discharge: 2019-10-27 | Payer: MEDICAID | Attending: Rheumatology | Primary: Rheumatology

## 2019-10-26 DIAGNOSIS — M0579 Rheumatoid arthritis with rheumatoid factor of multiple sites without organ or systems involvement: Principal | ICD-10-CM

## 2019-10-26 DIAGNOSIS — Z1321 Encounter for screening for nutritional disorder: Principal | ICD-10-CM

## 2019-10-26 DIAGNOSIS — Z114 Encounter for screening for human immunodeficiency virus [HIV]: Principal | ICD-10-CM

## 2019-10-26 DIAGNOSIS — Z1159 Encounter for screening for other viral diseases: Principal | ICD-10-CM

## 2019-10-26 DIAGNOSIS — Z111 Encounter for screening for respiratory tuberculosis: Principal | ICD-10-CM

## 2019-10-26 MED ORDER — HYDROXYCHLOROQUINE 200 MG TABLET
ORAL_TABLET | Freq: Two times a day (BID) | ORAL | 3 refills | 90 days | Status: CP
Start: 2019-10-26 — End: 2020-10-25

## 2019-10-26 MED ORDER — PREDNISONE 10 MG TABLET
ORAL_TABLET | Freq: Every day | ORAL | 1 refills | 90.00000 days | Status: CP
Start: 2019-10-26 — End: ?

## 2019-10-30 NOTE — Progress Notes (Deleted)
No show to appointment.

## 2019-10-31 ENCOUNTER — Ambulatory Visit: Payer: Medicaid Other | Admitting: Pain Medicine

## 2019-10-31 ENCOUNTER — Ambulatory Visit: Admit: 2019-10-31 | Discharge: 2019-11-01 | Payer: MEDICAID | Attending: Urology | Primary: Urology

## 2019-10-31 ENCOUNTER — Ambulatory Visit
Admit: 2019-10-31 | Discharge: 2019-11-01 | Payer: MEDICAID | Attending: Foot & Ankle Surgery | Primary: Foot & Ankle Surgery

## 2019-10-31 DIAGNOSIS — E139 Other specified diabetes mellitus without complications: Principal | ICD-10-CM

## 2019-10-31 MED ORDER — SOLIFENACIN 5 MG TABLET
ORAL_TABLET | Freq: Every day | ORAL | 11 refills | 30.00000 days | Status: CP
Start: 2019-10-31 — End: ?

## 2019-11-02 ENCOUNTER — Ambulatory Visit: Admit: 2019-11-02 | Discharge: 2019-11-03 | Payer: MEDICAID

## 2019-11-02 DIAGNOSIS — M069 Rheumatoid arthritis, unspecified: Principal | ICD-10-CM

## 2019-11-02 DIAGNOSIS — M0579 Rheumatoid arthritis with rheumatoid factor of multiple sites without organ or systems involvement: Principal | ICD-10-CM

## 2019-11-05 MED ORDER — DICLOFENAC 1 % TOPICAL GEL
Freq: Every evening | TOPICAL | 1 refills | 100.00000 days | Status: CP
Start: 2019-11-05 — End: 2020-11-04

## 2019-11-06 ENCOUNTER — Other Ambulatory Visit: Payer: Self-pay | Admitting: Psychiatry

## 2019-11-06 ENCOUNTER — Ambulatory Visit: Payer: Medicaid Other | Admitting: Pain Medicine

## 2019-11-06 MED ORDER — ZINC SULFATE 50 MG ZINC (220 MG) CAPSULE
ORAL_CAPSULE | 11 refills | 0 days
Start: 2019-11-06 — End: ?

## 2019-11-06 MED ORDER — TRADJENTA 5 MG TABLET
ORAL_TABLET | 11 refills | 0 days
Start: 2019-11-06 — End: ?

## 2019-11-06 MED ORDER — ATORVASTATIN 80 MG TABLET
ORAL_TABLET | 11 refills | 0 days
Start: 2019-11-06 — End: ?

## 2019-11-07 ENCOUNTER — Ambulatory Visit
Admit: 2019-11-07 | Discharge: 2019-11-08 | Payer: MEDICAID | Attending: Foot & Ankle Surgery | Primary: Foot & Ankle Surgery

## 2019-11-07 DIAGNOSIS — E11621 Type 2 diabetes mellitus with foot ulcer: Principal | ICD-10-CM

## 2019-11-07 DIAGNOSIS — M79674 Pain in right toe(s): Secondary | ICD-10-CM

## 2019-11-07 DIAGNOSIS — L97529 Non-pressure chronic ulcer of other part of left foot with unspecified severity: Secondary | ICD-10-CM

## 2019-11-07 DIAGNOSIS — M79675 Pain in left toe(s): Secondary | ICD-10-CM

## 2019-11-07 DIAGNOSIS — B351 Tinea unguium: Principal | ICD-10-CM

## 2019-11-07 MED ORDER — ASCORBIC ACID (VITAMIN C) 500 MG TABLET
ORAL_TABLET | Freq: Every day | ORAL | 0 refills | 90 days | Status: CP
Start: 2019-11-07 — End: 2020-11-06

## 2019-11-07 MED ORDER — ATORVASTATIN 80 MG TABLET
ORAL_TABLET | Freq: Every evening | ORAL | 0 refills | 90.00000 days | Status: CP
Start: 2019-11-07 — End: 2020-11-06

## 2019-11-07 MED ORDER — ZINC SULFATE 50 MG ZINC (220 MG) CAPSULE
ORAL_CAPSULE | Freq: Every day | ORAL | 0 refills | 90 days | Status: CP
Start: 2019-11-07 — End: 2020-11-06

## 2019-11-07 MED ORDER — FAMOTIDINE 20 MG TABLET
ORAL_TABLET | Freq: Every day | ORAL | 0 refills | 90.00000 days | Status: CP
Start: 2019-11-07 — End: 2020-11-06

## 2019-11-16 ENCOUNTER — Ambulatory Visit: Admit: 2019-11-16 | Discharge: 2019-11-17 | Payer: MEDICAID

## 2019-11-18 NOTE — Progress Notes (Addendum)
PROVIDER NOTE: Information contained herein reflects review and annotations entered in association with encounter. Interpretation of such information and data should be left to medically-trained personnel. Information provided to patient can be located elsewhere in the medical record under "Patient Instructions". Document created using STT-dictation technology, any transcriptional errors that may result from process are unintentional.    Patient: [name] [name]  Service Category: E/M  Provider: Gaspar Cola, MD  DOB: [birth year]  DOS: 11/19/2019  Specialty: Interventional Pain Management  MRN: [id number]  Setting: Ambulatory outpatient  PCP: [facility]  Type: Established Patient    Referring Provider: [facility]  Location: Office  Delivery: Face-to-face     HPI  Reason for encounter: [name] [name], a 59 y.o. year old female, is here today for evaluation and management of her Chronic pain syndrome [G89.4]. [name] [name] primary complain today is Hand Pain (bilateral) Last encounter: Practice (Visit date not found). My last encounter with her was on Visit date not found. Pertinent problems: [name] [name] has Chronic wrist pain (Secondary area of Pain) (Bilateral) (L>R); Adhesive capsulitis; 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[employer]); Chronic knee pain (Bilateral) (R>L); Neurogenic pain; Neuropathic pain; Diabetic peripheral neuropathy ([place]); Osteoarthritis, multiple sites; Chronic foot pain (Primary Area of Pain) (Bilateral) (L>R); Chronic elbow pain (Third area of Pain) (Bilateral) (L>R); Chronic shoulder pain (Bilateral) (L>R); Chronic neck pain (Bilateral) (R>L); Chronic upper back pain; Chronic hand pain (Bilateral) (L>R); Rheumatoid arthritis ([place]); Chronic pain syndrome; Neuropathy; Rheumatoid arthritis involving multiple sites with positive rheumatoid factor ([place]); Right arm weakness; and Paroxysmal A-fib (HCC) on their pertinent problem list. Pain  Assessment: Severity of Chronic pain is reported as a 8 /10. Location: Hand Left, Right/all over due to RA. Onset: More than a month ago. Quality: Shooting. Timing: Constant. Modifying factor(s): pain medication. Vitals:  height is 5' 6"  (1.676 m) and weight is 309 lb (140.2 kg) (abnormal). Her temporal temperature is 97.5 F (36.4 C) (abnormal). Her blood pressure is 132/85 (abnormal) and her pulse is 85. Her respiration is 17 and oxygen saturation is 99%.   This patient has not been using her oxycodone IR 4 times a day.  The last time that she had a prescription filled was on 08/13/2019 for 120 tablets.  I estimate that she perhaps may be using 1 [name].  Her zolpidem is being prescribed by Marshell Garfinkel, MD. the only other medication that we are also prescribing for this patient is pregabalin (Lyrica) 150 mg capsule to be taken 1 tablet 3 times a day.  Today I questioned the patient about her use of the opioids.  The question came about as a consequence of the fact that her last UDS was negative for oxycodone.  This UDS had been done on 11/16/2018.  The patient was admitted to the hospital with COVID-19 around December.  She indicates that after she was discharged, then she had to be readmitted due to sepsis.  She refers having been discharged from the hospital a couple weeks ago.  She refers not having taken any pain medication for the past week.  I reminded her that hoarding medications is not appropriate and that I am supposed to write her enough medicine to last only for 30 days.  She has insisted that she normally will use 120 tablets/month (1 tablet p.o. 4 times [name]).  If I follow the numbers from the PMP, this does not quite add up.  Today I will be giving her a single prescription for  the medication and I will have her come back before the 30 days, at which time I will be doing a UDS to see if she is using her medications. The patient indicates doing well with the current medication regimen. No  adverse reactions or side effects reported to the medications.   In addition to the oxycodone IR 5 mg, I have also been prescribing pregabalin (Lyrica 150 mg, 1 tablet p.o. 3 times [name], for this patient.  Today I will request that this be taken over the 58 right the patient's PCP Sharen Hones, NP), so that I can concentrate only on the opioid analgesics.  Pharmacotherapy Assessment   Analgesic: Oxycodone IR5 mg, 1 tab PO q 6hrs (39m/dayof oxycodone) MME/day:363mday.   Monitoring: [place] PMP: PDMP reviewed during this encounter.       Pharmacotherapy: No side-effects or adverse reactions reported. Compliance: No problems identified. Effectiveness: Clinically acceptable.  BrChauncey FischerRN  11/19/2019 10:24 AM  Signed Nursing Pain Medication Assessment:  Safety precautions to be maintained throughout the outpatient stay will include: orient to surroundings, keep bed in low position, maintain call bell within reach at all times, provide assistance with transfer out of bed and ambulation.  Medication Inspection Compliance: Pill count conducted under aseptic conditions, in front of the patient. Neither the pills nor the bottle was removed from the patient's sight at any time. Once count was completed pills were immediately returned to the patient in their original bottle.  Medication: Oxycodone IR Pill/Patch Count: 0 of 120 pills remain Pill/Patch Appearance: Markings consistent with prescribed medication Bottle Appearance: Standard pharmacy container. Clearly labeled. Filled Date: 4 / 2689 21 Last Medication intake:  Ran out of medicine more than 48 hours agoSafety precautions to be maintained throughout the outpatient stay will include: orient to surroundings, keep bed in low position, maintain call bell within reach at all times, provide assistance with transfer out of bed and ambulation.     UDS:  Summary  Date Value Ref Range Status  11/16/2018 Note  Final    Comment:     ==================================================================== [employer] [facility] (MW) ==================================================================== Test                             Result       Flag       Units Drug Absent but Declared for Prescription Verification   Oxycodone                      Not Detected UNEXPECTED ng/mg creat ==================================================================== Test                      Result    Flag   Units      Ref Range   Creatinine              71               mg/dL      >=20 ==================================================================== Declared Medications:  The flagging and interpretation on this report are based on the  following declared medications.  Unexpected results may arise from  inaccuracies in the declared medications.  **Note: The testing scope of this panel includes these medications:  Oxycodone  **Note: The testing scope of this panel does not include the  following reported medications:  Aripiprazole (Abilify)  Atorvastatin (Lipitor)  Betamethasone (Lotrisone)  Buspirone (Buspar)  Calcium  Cephalexin (Keflex)  Cholecalciferol  Clotrimazole (  Lotrisone)  Diclofenac  Fluoxetine (Prozac)  Folic Acid  Hydroxychloroquine (Plaquenil)  Hydroxyzine  Insulin  Leflunomide (Arava)  Levothyroxine  Lisinopril  Loperamide  Lovastatin  Melatonin  Metformin  Methotrexate  Phenazopyridine (Pyridium)  Potassium  Pregabalin (Lyrica)  Promethazine (Phenergan)  Quetiapine (Seroquel)  Vitamin D2 (Ergocalciferol)  Zolpidem (Ambien) ==================================================================== For clinical consultation, please call [phone]. ====================================================================      ROS  Constitutional: Denies any fever or chills Gastrointestinal: No reported hemesis, hematochezia, vomiting, or acute GI distress Musculoskeletal: Denies any acute onset joint  swelling, redness, loss of ROM, or weakness Neurological: No reported episodes of acute onset apraxia, aphasia, dysarthria, agnosia, amnesia, paralysis, loss of coordination, or loss of consciousness  Medication Review  ALPRAZolam, ARIPiprazole, Calcium Citrate-Vitamin D, FLUoxetine, QUEtiapine, apixaban, ascorbic acid, budesonide, busPIRone, famotidine, folic acid, furosemide, insulin degludec, insulin lispro, ipratropium-albuterol, levothyroxine, oxyCODONE, polyethylene glycol, potassium chloride, potassium chloride SA, pramipexole, pregabalin, and zolpidem  History Review  Allergy: [name] [name] is allergic to cephalexin, codeine, doxycycline, propoxyphene, sulfa antibiotics, lovenox [enoxaparin sodium], hydrocodone, and meropenem. Drug: [name] [name]  reports no history of drug use. Alcohol:  reports no history of alcohol use. Tobacco:  reports that she quit smoking about 20 years ago. Her smoking use included cigarettes. She has a 54.00 pack-year smoking history. She has never used smokeless tobacco. Social: [name] [name]  reports that she quit smoking about 20 years ago. Her smoking use included cigarettes. She has a 54.00 pack-year smoking history. She has never used smokeless tobacco. She reports that she does not drink alcohol and does not use drugs. Medical:  has a past medical history of Abdominal wall hernia (01/29/2013), Anxiety, Arthritis, C. difficile colitis, Chronic diastolic heart failure ([place]), COVID-19 (03/23/2019), Depression, Diabetes mellitus, Diastolic CHF ([place]), Esophagitis, Fluid retention, GERD (gastroesophageal reflux disease), Hiatal hernia, Hypertension, Hypokalemia due to loss of potassium (10/21/2015), Hypothyroidism, IBS (irritable bowel syndrome), Moderate episode of recurrent major depressive disorder ([place]) (06/03/2004), Morbid obesity ([place]), MRSA (methicillin resistant Staphylococcus aureus) infection (11/2017), Neurogenic bladder, Neuropathy, Obesity, Panic attacks, Pneumonia due to  COVID-19 virus, Rheumatoid arthritis ([place]), and Sleep apnea. Surgical: [name] [name]  has a past surgical history that includes Tubal ligation; Tonsillectomy; Cholecystectomy; Abdominal hysterectomy; Laparoscopic gastric banding (03/20/07); Eye surgery; Hernia repair; DG GREAT TOE RIGHT FOOT (02/23/2018); and TEE without cardioversion (N/A, 07/16/2019). Family: family history includes Alcohol abuse in her father and sister; Anxiety disorder in her father, sister, and sister; Bipolar disorder in her father and sister; Depression in her father, sister, and sister; Drug abuse in her sister; Heart attack in her brother; Heart attack (age of onset: 34) in her brother; Heart disease in her brother; Heart failure in her father.  Laboratory Chemistry Profile   Renal Lab Results  Component Value Date   BUN 14 08/13/2019   CREATININE 0.62 08/13/2019   BCR 16 11/16/2018   GFRAA >60 08/13/2019   GFRNONAA >60 08/13/2019     Hepatic Lab Results  Component Value Date   AST 11 (L) 08/13/2019   ALT 11 08/13/2019   ALBUMIN 2.9 (L) 08/13/2019   ALKPHOS 66 08/13/2019   LIPASE 176 (H) 07/13/2019     Electrolytes Lab Results  Component Value Date   NA 136 08/13/2019   K 3.5 08/13/2019   CL 99 08/13/2019   CALCIUM 8.5 (L) 08/13/2019   MG 1.5 (L) 08/02/2019   PHOS 3.3 07/17/2019     Bone Lab Results  Component Value Date   VD25OH 37.36 04/30/2019   25OHVITD1 23 (L) 11/16/2018  25OHVITD2 2.2 11/16/2018   25OHVITD3 21 11/16/2018     Inflammation (CRP: Acute Phase) (ESR: Chronic Phase) Lab Results  Component Value Date   CRP 2.1 (H) 07/17/2019   ESRSEDRATE 65 (H) 07/17/2019   LATICACIDVEN 1.6 07/10/2019       Note: Above Lab results reviewed.  Recent Imaging Review  DG Chest Portable 1 View CLINICAL DATA:  Shortness of breath with loss of smell  EXAM: PORTABLE CHEST 1 VIEW  COMPARISON:  July 08, 2019 chest radiograph and July 09, 2019 CT angiogram chest  FINDINGS: There remains  somewhat diffuse interstitial thickening throughout the lungs without frank airspace opacity. No appreciable pleural effusions. Heart upper normal in size with pulmonary vascularity normal. No bone lesions.  IMPRESSION: Diffuse interstitial thickening throughout the lungs without frank airspace opacity. Question developing fibrosis bilaterally. A degree of residua from atypical organism pneumonia is quite possible. No consolidation or well-defined airspace opacity on this study.  Heart upper normal in size.  No adenopathy evident.  Electronically Signed   By: Lowella Grip III M.D.   On: 08/02/2019 17:54 Note: Reviewed        Physical Exam  General appearance: Well nourished, well developed, and well hydrated. In no apparent acute distress Mental status: Alert, oriented x 3 (person, place, & time)       Respiratory: No evidence of acute respiratory distress Eyes: PERLA Vitals: BP (!) 132/85 (BP Location: Right Arm, Patient Position: Sitting, Cuff Size: Large)   Pulse 85   Temp (!) 97.5 F (36.4 C) (Temporal)   Resp 17   Ht 5' 6"  (1.676 m)   Wt (!) 309 lb (140.2 kg)   LMP 04/20/2001   SpO2 99%   BMI 49.87 kg/m  BMI: Estimated body mass index is 49.87 kg/m as calculated from the following:   Height as of this encounter: 5' 6"  (1.676 m).   Weight as of this encounter: 309 lb (140.2 kg). Ideal: Ideal body weight: 59.3 kg (130 lb 11.7 oz) Adjusted ideal body weight: 91.6 kg (202 lb 0.6 oz)  Assessment   Status Diagnosis  Controlled Controlled Controlled 1. Chronic pain syndrome   2. Chronic foot pain (Primary Area of Pain) (Bilateral) (L>R)   3. Chronic wrist pain (Secondary area of Pain) (Bilateral) (L>R)   4. Chronic elbow pain (Third area of Pain) (Bilateral) (L>R)   5. Rheumatoid arthritis involving both shoulders with positive rheumatoid factor (HCC)   6. Pharmacologic therapy   7. Diabetic peripheral neuropathy ([place])   8. Neurogenic pain   9. Neuropathic  pain      Updated Problems: No problems updated.  Plan of Care  Problem-specific:  No problem-specific Assessment & Plan notes found for this encounter.  [name] [name] has a current medication list which includes the following long-term medication(s): apixaban, aripiprazole, budesonide, famotidine, fluoxetine, furosemide, gnp calcium citrate+d maximum, insulin lispro, ipratropium-albuterol, oxycodone, potassium chloride sa, pregabalin, quetiapine, zolpidem, and potassium chloride.  Pharmacotherapy (Medications Ordered): Meds ordered this encounter  Medications  . pregabalin (LYRICA) 150 MG capsule    Sig: Take 1 capsule (150 mg total) by mouth 3 (three) times [name].    Dispense:  90 capsule    Refill:  5    Fill one day early if pharmacy is closed on scheduled refill date. May substitute for generic if available.  Marland Kitchen oxyCODONE (OXY IR/ROXICODONE) 5 MG immediate release tablet    Sig: Take 1 tablet (5 mg total) by mouth every 6 (six) hours as  needed for severe pain. Must last 30 days    Dispense:  120 tablet    Refill:  0    Chronic Pain: STOP Act (Not applicable) Fill 1 day early if closed on refill date. Do not fill until: 11/19/2019. To last until: 12/19/2019. Avoid benzodiazepines within 8 hours of opioids   Orders:  No orders of the defined types were placed in this encounter.  Follow-up plan:   Return in about 4 weeks (around 12/17/2019) for F2F encounter, 20-min, MM (on eval day).  Today I sent a letter to the patient's PCP requesting that they take over them non-opioid medications.     Interventional therapies: Planned, scheduled, and/or pending:   Not at this time.   Considering:   Diagnostic/therapeutic IV lidocaine infusions.  Diagnostic bilateral lumbar sympathetic block  Diagnostic bilateral intra-articular shoulder joint injection  Diagnostic bilateral intra-articular knee injections with local anesthetic and steroid   Diagnostic right-sided CESI  Diagnostic  bilateral Cervicalfacet block  Possible bilateral cervical facet RFA.    Palliative PRN treatment(s):   Palliative IV lidocaine infusion     Recent Visits No visits were found meeting these conditions. Showing recent visits within past 90 days and meeting all other requirements Today's Visits Date Type Provider Dept  11/19/19 Office Visit Milinda Pointer, MD [facility]  Showing today's visits and meeting all other requirements Future Appointments Date Type Provider Dept  12/17/19 Appointment Milinda Pointer, MD [facility]  Showing future appointments within next 90 days and meeting all other requirements  I discussed the assessment and treatment plan with the patient. The patient was provided an opportunity to ask questions and all were answered. The patient agreed with the plan and demonstrated an understanding of the instructions.  Patient advised to call back or seek an in-person evaluation if the symptoms or condition worsens.  Duration of encounter: 30 minutes.  Note by: Gaspar Cola, MD Date: 11/19/2019; Time: 1:50 PM

## 2019-11-18 NOTE — Patient Instructions (Signed)
____________________________________________________________________________________________  Drug Holidays (Slow)  What is a "Drug Holiday"? Drug Holiday: is the name given to the period of time during which a patient stops taking a medication(s) for the purpose of eliminating tolerance to the drug.  Benefits . Improved effectiveness of opioids. . Decreased opioid dose needed to achieve benefits. . Improved pain with lesser dose.  What is tolerance? Tolerance: is the progressive decreased in effectiveness of a drug due to its repetitive use. With repetitive use, the body gets use to the medication and as a consequence, it loses its effectiveness. This is a common problem seen with opioid pain medications. As a result, a larger dose of the drug is needed to achieve the same effect that used to be obtained with a smaller dose.  How long should a "Drug Holiday" last? You should stay off of the pain medicine for at least 14 consecutive days. (2 weeks)  Should I stop the medicine "cold turkey"? No. You should always coordinate with your Pain Specialist so that he/she can provide you with the correct medication dose to make the transition as smoothly as possible.  How do I stop the medicine? Slowly. You will be instructed to decrease the daily amount of pills that you take by one (1) pill every seven (7) days. This is called a "slow downward taper" of your dose. For example: if you normally take four (4) pills per day, you will be asked to drop this dose to three (3) pills per day for seven (7) days, then to two (2) pills per day for seven (7) days, then to one (1) per day for seven (7) days, and at the end of those last seven (7) days, this is when the "Drug Holiday" would start.   Will I have withdrawals? By doing a "slow downward taper" like this one, it is unlikely that you will experience any significant withdrawal symptoms. Typically, what triggers withdrawals is the sudden stop of a high  dose opioid therapy. Withdrawals can usually be avoided by slowly decreasing the dose over a prolonged period of time. If you do not follow these instructions and decide to stop your medication abruptly, withdrawals may be possible.  What are withdrawals? Withdrawals: refers to the wide range of symptoms that occur after stopping or dramatically reducing opiate drugs after heavy and prolonged use. Withdrawal symptoms do not occur to patients that use low dose opioids, or those who take the medication sporadically. Contrary to benzodiazepine (example: Valium, Xanax, etc.) or alcohol withdrawals ("Delirium Tremens"), opioid withdrawals are not lethal. Withdrawals are the physical manifestation of the body getting rid of the excess receptors.  Expected Symptoms Early symptoms of withdrawal may include: . Agitation . Anxiety . Muscle aches . Increased tearing . Insomnia . Runny nose . Sweating . Yawning  Late symptoms of withdrawal may include: . Abdominal cramping . Diarrhea . Dilated pupils . Goose bumps . Nausea . Vomiting  Will I experience withdrawals? Due to the slow nature of the taper, it is very unlikely that you will experience any.  What is a slow taper? Taper: refers to the gradual decrease in dose.  (Last update: 11/07/2019) ____________________________________________________________________________________________    ____________________________________________________________________________________________  Medication Rules  Purpose: To inform patients, and their family members, of our rules and regulations.  Applies to: All patients receiving prescriptions (written or electronic).  Pharmacy of record: Pharmacy where electronic prescriptions will be sent. If written prescriptions are taken to a different pharmacy, please inform the nursing staff. The pharmacy   listed in the electronic medical record should be the one where you would like electronic prescriptions  to be sent.  Electronic prescriptions: In compliance with the [place] Strengthen Opioid Misuse Prevention (STOP) Act of 2017 (Session Law 2017-74/H243), effective April 19, 2018, all controlled substances must be electronically prescribed. Calling prescriptions to the pharmacy will cease to exist.  Prescription refills: Only during scheduled appointments. Applies to all prescriptions.  NOTE: The following applies primarily to controlled substances (Opioid* Pain Medications).   Type of encounter (visit): For patients receiving controlled substances, face-to-face visits are required. (Not an option or up to the patient.)  Patient's responsibilities: 1. Pain Pills: Bring all pain pills to every appointment (except for procedure appointments). 2. Pill Bottles: Bring pills in original pharmacy bottle. Always bring the newest bottle. Bring bottle, even if empty. 3. Medication refills: You are responsible for knowing and keeping track of what medications you take and those you need refilled. The day before your appointment: write a list of all prescriptions that need to be refilled. The day of the appointment: give the list to the admitting nurse. Prescriptions will be written only during appointments. No prescriptions will be written on procedure days. If you forget a medication: it will not be "Called in", "Faxed", or "electronically sent". You will need to get another appointment to get these prescribed. No early refills. Do not call asking to have your prescription filled early. 4. Prescription Accuracy: You are responsible for carefully inspecting your prescriptions before leaving our office. Have the discharge nurse carefully go over each prescription with you, before taking them home. Make sure that your name is accurately spelled, that your address is correct. Check the name and dose of your medication to make sure it is accurate. Check the number of pills, and the written instructions to  make sure they are clear and accurate. Make sure that you are given enough medication to last until your next medication refill appointment. 5. Taking Medication: Take medication as prescribed. When it comes to controlled substances, taking less pills or less frequently than prescribed is permitted and encouraged. Never take more pills than instructed. Never take medication more frequently than prescribed.  6. Inform other Doctors: Always inform, all of your healthcare providers, of all the medications you take. 7. Pain Medication from other Providers: You are not allowed to accept any additional pain medication from any other Doctor or Healthcare provider. There are two exceptions to this rule. (see below) In the event that you require additional pain medication, you are responsible for notifying us, as stated below. 8. Medication Agreement: You are responsible for carefully reading and following our Medication Agreement. This must be signed before receiving any prescriptions from our practice. Safely store a copy of your signed Agreement. Violations to the Agreement will result in no further prescriptions. (Additional copies of our Medication Agreement are available upon request.) 9. Laws, Rules, & Regulations: All patients are expected to follow all Federal and State Laws, Statutes, Rules, & Regulations. Ignorance of the Laws does not constitute a valid excuse.  10. Illegal drugs and Controlled Substances: The use of illegal substances (including, but not limited to marijuana and its derivatives) and/or the illegal use of any controlled substances is strictly prohibited. Violation of this rule may result in the immediate and permanent discontinuation of any and all prescriptions being written by our practice. The use of any illegal substances is prohibited. 11. Adopted CDC guidelines & recommendations: Target dosing levels will be at or   below 60 MME/day. Use of benzodiazepines** is not  recommended.  Exceptions: There are only two exceptions to the rule of not receiving pain medications from other Healthcare Providers. 1. Exception #1 (Emergencies): In the event of an emergency (i.e.: accident requiring emergency care), you are allowed to receive additional pain medication. However, you are responsible for: As soon as you are able, call our office [phone], at any time of the day or night, and leave a message stating your name, the date and nature of the emergency, and the name and dose of the medication prescribed. In the event that your call is answered by a member of our staff, make sure to document and save the date, time, and the name of the person that took your information.  2. Exception #2 (Planned Surgery): In the event that you are scheduled by another doctor or dentist to have any type of surgery or procedure, you are allowed (for a period no longer than 30 days), to receive additional pain medication, for the acute post-op pain. However, in this case, you are responsible for picking up a copy of our "Post-op Pain Management for Surgeons" handout, and giving it to your surgeon or dentist. This document is available at our office, and does not require an appointment to obtain it. Simply go to our office during business hours (Monday-Thursday from 8:00 AM to 4:00 PM) (Friday 8:00 AM to 12:00 Noon) or if you have a scheduled appointment with us, prior to your surgery, and ask for it by name. In addition, you will need to provide us with your name, name of your surgeon, type of surgery, and date of procedure or surgery.  *Opioid medications include: morphine, codeine, oxycodone, oxymorphone, hydrocodone, hydromorphone, meperidine, tramadol, tapentadol, buprenorphine, fentanyl, methadone. **Benzodiazepine medications include: diazepam (Valium), alprazolam (Xanax), clonazepam (Klonopine), lorazepam (Ativan), clorazepate (Tranxene), chlordiazepoxide (Librium), estazolam (Prosom),  oxazepam (Serax), temazepam (Restoril), triazolam (Halcion) (Last updated: 06/16/2017) ____________________________________________________________________________________________   ____________________________________________________________________________________________  Medication Recommendations and Reminders  Applies to: All patients receiving prescriptions (written and/or electronic).  Medication Rules & Regulations: These rules and regulations exist for your safety and that of others. They are not flexible and neither are we. Dismissing or ignoring them will be considered "non-compliance" with medication therapy, resulting in complete and irreversible termination of such therapy. (See document titled "Medication Rules" for more details.) In all conscience, because of safety reasons, we cannot continue providing a therapy where the patient does not follow instructions.  Pharmacy of record:   Definition: This is the pharmacy where your electronic prescriptions will be sent.   We do not endorse any particular pharmacy, however, we have experienced problems with [facility] not securing enough medication supply for the community.  We do not restrict you in your choice of pharmacy. However, once we write for your prescriptions, we will NOT be re-sending more prescriptions to fix restricted supply problems created by your pharmacy, or your insurance.   The pharmacy listed in the electronic medical record should be the one where you want electronic prescriptions to be sent.  If you choose to change pharmacy, simply notify our nursing staff.  Recommendations:  Keep all of your pain medications in a safe place, under lock and key, even if you live alone. We will NOT replace lost, stolen, or damaged medication.  After you fill your prescription, take 1 week's worth of pills and put them away in a safe place. You should keep a separate, properly labeled bottle for this purpose. The remainder    should be kept in the original bottle. Use this as your primary supply, until it runs out. Once it's gone, then you know that you have 1 week's worth of medicine, and it is time to come in for a prescription refill. If you do this correctly, it is unlikely that you will ever run out of medicine.  To make sure that the above recommendation works, it is very important that you make sure your medication refill appointments are scheduled at least 1 week before you run out of medicine. To do this in an effective manner, make sure that you do not leave the office without scheduling your next medication management appointment. Always ask the nursing staff to show you in your prescription , when your medication will be running out. Then arrange for the receptionist to get you a return appointment, at least 7 days before you run out of medicine. Do not wait until you have 1 or 2 pills left, to come in. This is very poor planning and does not take into consideration that we may need to cancel appointments due to bad weather, sickness, or emergencies affecting our staff.  DO NOT ACCEPT A "Partial Fill": If for any reason your pharmacy does not have enough pills/tablets to completely fill or refill your prescription, do not allow for a "partial fill". The law allows the pharmacy to complete that prescription within 72 hours, without requiring a new prescription. If they do not fill the rest of your prescription within those 72 hours, you will need a separate prescription to fill the remaining amount, which we will NOT provide. If the reason for the partial fill is your insurance, you will need to talk to the pharmacist about payment alternatives for the remaining tablets, but again, DO NOT ACCEPT A PARTIAL FILL, unless you can trust your pharmacist to obtain the remainder of the pills within 72 hours.  Prescription refills and/or changes in medication(s):   Prescription refills, and/or changes in dose or medication,  will be conducted only during scheduled medication management appointments. (Applies to both, written and electronic prescriptions.)  No refills on procedure days. No medication will be changed or started on procedure days. No changes, adjustments, and/or refills will be conducted on a procedure day. Doing so will interfere with the diagnostic portion of the procedure.  No phone refills. No medications will be "called into the pharmacy".  No Fax refills.  No weekend refills.  No Holliday refills.  No after hours refills.  Remember:  Business hours are:  Monday to Thursday 8:00 AM to 4:00 PM Provider's Schedule: Jaelyn Cloninger, MD - Appointments are:  Medication management: Monday and Wednesday 8:00 AM to 4:00 PM Procedure day: Tuesday and Thursday 7:30 AM to 4:00 PM Bilal Lateef, MD - Appointments are:  Medication management: Tuesday and Thursday 8:00 AM to 4:00 PM Procedure day: Monday and Wednesday 7:30 AM to 4:00 PM (Last update: 11/07/2019) ____________________________________________________________________________________________   ____________________________________________________________________________________________  CANNABIDIOL (AKA: CBD Oil or Pills)  Applies to: All patients receiving prescriptions of controlled substances (written and/or electronic).  General Information: Cannabidiol (CBD), a derivative of Marijuana, was discovered in 1940. It is one of some 113 identified cannabinoids in cannabis (Marijuana) plants, accounting for up to 40% of the plant's extract. As of 2018, preliminary clinical research on cannabidiol included studies of anxiety, cognition, movement disorders, and pain.  Cannabidiol is consummed in multiple ways, including inhalation of cannabis smoke or vapor, as an aerosol spray into the cheek, and by mouth. It   may be supplied as CBD oil containing CBD as the active ingredient (no added tetrahydrocannabinol (THC) or terpenes), a full-plant  CBD-dominant hemp extract oil, capsules, dried cannabis, or as a liquid solution. CBD is thought not have the same psychoactivity as THC, and may affect the actions of THC. Studies suggest that CBD may interact with different biological targets, including cannabinoid receptors and other neurotransmitter receptors. As of 2018 the mechanism of action for its biological effects has not been determined.  In the [place], cannabidiol has a limited approval by the Food and Drug Administration (FDA) for treatment of only two types of epilepsy disorders. The side effects of long-term use of the drug include somnolence, decreased appetite, diarrhea, fatigue, malaise, weakness, sleeping problems, and others.  CBD remains a Schedule I drug prohibited for any use.  Legality: Some manufacturers ship CBD products nationally, an illegal action which the FDA has not enforced in 2018, with CBD remaining the subject of an FDA investigational new drug evaluation, and is not considered legal as a dietary supplement or food ingredient as of December 2018. Federal illegality has made it difficult historically to conduct research on CBD. CBD is openly sold in head shops and health food stores in some states where such sales have not been explicitly legalized.  Warning: Because it is not FDA approved for general use or treatment of pain, it is not required to undergo the same manufacturing controls as prescription drugs.  This means that the available cannabidiol (CBD) may be contaminated with THC.  If this is the case, it will trigger a positive urine drug screen (UDS) test for cannabinoids (Marijuana).  Because a positive UDS for illicit substances is a violation of our medication agreement, your opioid analgesics (pain medicine) may be permanently discontinued. (Last update: 11/07/2019) ____________________________________________________________________________________________    

## 2019-11-19 ENCOUNTER — Other Ambulatory Visit: Payer: Self-pay

## 2019-11-19 ENCOUNTER — Ambulatory Visit: Payer: Medicaid Other | Attending: Pain Medicine | Admitting: Pain Medicine

## 2019-11-19 ENCOUNTER — Encounter: Payer: Self-pay | Admitting: Pain Medicine

## 2019-11-19 VITALS — BP 132/85 | HR 85 | Temp 97.5°F | Resp 17 | Ht 66.0 in | Wt 309.0 lb

## 2019-11-19 DIAGNOSIS — M05712 Rheumatoid arthritis with rheumatoid factor of left shoulder without organ or systems involvement: Secondary | ICD-10-CM | POA: Insufficient documentation

## 2019-11-19 DIAGNOSIS — M792 Neuralgia and neuritis, unspecified: Secondary | ICD-10-CM | POA: Diagnosis present

## 2019-11-19 DIAGNOSIS — G894 Chronic pain syndrome: Secondary | ICD-10-CM | POA: Diagnosis not present

## 2019-11-19 DIAGNOSIS — M25531 Pain in right wrist: Secondary | ICD-10-CM | POA: Insufficient documentation

## 2019-11-19 DIAGNOSIS — M79673 Pain in unspecified foot: Secondary | ICD-10-CM | POA: Diagnosis not present

## 2019-11-19 DIAGNOSIS — M25529 Pain in unspecified elbow: Secondary | ICD-10-CM | POA: Diagnosis not present

## 2019-11-19 DIAGNOSIS — M05711 Rheumatoid arthritis with rheumatoid factor of right shoulder without organ or systems involvement: Secondary | ICD-10-CM | POA: Diagnosis present

## 2019-11-19 DIAGNOSIS — G8929 Other chronic pain: Secondary | ICD-10-CM | POA: Insufficient documentation

## 2019-11-19 DIAGNOSIS — M25532 Pain in left wrist: Secondary | ICD-10-CM

## 2019-11-19 DIAGNOSIS — Z79899 Other long term (current) drug therapy: Secondary | ICD-10-CM | POA: Diagnosis present

## 2019-11-19 DIAGNOSIS — E1142 Type 2 diabetes mellitus with diabetic polyneuropathy: Secondary | ICD-10-CM | POA: Diagnosis present

## 2019-11-19 MED ORDER — PREGABALIN 150 MG PO CAPS: 150.0000 mg | ORAL_CAPSULE | Freq: Three times a day (TID) | ORAL | 5 refills | Status: DC

## 2019-11-19 MED ORDER — OXYCODONE HCL 5 MG PO TABS: 5.0000 mg | ORAL_TABLET | Freq: Four times a day (QID) | ORAL | 0 refills | Status: DC | PRN

## 2019-11-19 NOTE — Progress Notes (Signed)
Nursing Pain Medication Assessment:  Safety precautions to be maintained throughout the outpatient stay will include: orient to surroundings, keep bed in low position, maintain call bell within reach at all times, provide assistance with transfer out of bed and ambulation.  Medication Inspection Compliance: Pill count conducted under aseptic conditions, in front of the patient. Neither the pills nor the bottle was removed from the patient's sight at any time. Once count was completed pills were immediately returned to the patient in their original bottle.  Medication: Oxycodone IR Pill/Patch Count: 0 of 120 pills remain Pill/Patch Appearance: Markings consistent with prescribed medication Bottle Appearance: Standard pharmacy container. Clearly labeled. Filled Date: 4 / 56 / 21 Last Medication intake:  Ran out of medicine more than 48 hours agoSafety precautions to be maintained throughout the outpatient stay will include: orient to surroundings, keep bed in low position, maintain call bell within reach at all times, provide assistance with transfer out of bed and ambulation.

## 2019-11-20 ENCOUNTER — Other Ambulatory Visit: Payer: Self-pay | Admitting: *Deleted

## 2019-11-20 DIAGNOSIS — G894 Chronic pain syndrome: Secondary | ICD-10-CM

## 2019-11-28 ENCOUNTER — Ambulatory Visit
Admission: EM | Admit: 2019-11-28 | Discharge: 2019-11-28 | Disposition: A | Discharge status: Home / Self Care | Payer: Medicaid Other | Attending: Family Medicine | Admitting: Family Medicine

## 2019-11-28 ENCOUNTER — Other Ambulatory Visit: Payer: Self-pay

## 2019-11-28 ENCOUNTER — Encounter: Payer: Self-pay | Admitting: Emergency Medicine

## 2019-11-28 ENCOUNTER — Ambulatory Visit: Admit: 2019-11-28 | Discharge: 2019-11-29 | Payer: MEDICAID

## 2019-11-28 ENCOUNTER — Ambulatory Visit
Admit: 2019-11-28 | Discharge: 2019-11-29 | Payer: MEDICAID | Attending: Foot & Ankle Surgery | Primary: Foot & Ankle Surgery

## 2019-11-28 DIAGNOSIS — L97522 Non-pressure chronic ulcer of other part of left foot with fat layer exposed: Secondary | ICD-10-CM

## 2019-11-28 DIAGNOSIS — M79675 Pain in left toe(s): Principal | ICD-10-CM

## 2019-11-28 DIAGNOSIS — M79674 Pain in right toe(s): Principal | ICD-10-CM

## 2019-11-28 DIAGNOSIS — E11621 Type 2 diabetes mellitus with foot ulcer: Principal | ICD-10-CM

## 2019-11-28 DIAGNOSIS — B351 Tinea unguium: Principal | ICD-10-CM

## 2019-11-28 DIAGNOSIS — J340 Abscess, furuncle and carbuncle of nose: Secondary | ICD-10-CM

## 2019-11-28 MED ORDER — CLINDAMYCIN HCL 150 MG PO CAPS
450.0000 mg | ORAL_CAPSULE | Freq: Three times a day (TID) | ORAL | 0 refills | Status: AC
Start: 2019-11-28 — End: 2019-12-05

## 2019-11-28 MED ORDER — MUPIROCIN 2 % EX OINT: 1.0000 "application " | TOPICAL_OINTMENT | Freq: Two times a day (BID) | CUTANEOUS | 0 refills | Status: DC

## 2019-11-28 MED ORDER — MUPIROCIN 2 % TOPICAL OINTMENT
TOPICAL | 0 days
Start: 2019-11-28 — End: ?

## 2019-11-28 MED ORDER — AMOXICILLIN 875 MG-POTASSIUM CLAVULANATE 125 MG TABLET
ORAL_TABLET | Freq: Two times a day (BID) | ORAL | 0 refills | 14.00000 days | Status: CP
Start: 2019-11-28 — End: 2019-12-12

## 2019-11-28 MED ORDER — SODIUM HYPOCHLORITE 0.25 % SOLUTION
Freq: Once | TOPICAL | 3 refills | 0 days | Status: CP
Start: 2019-11-28 — End: 2019-11-28

## 2019-11-28 NOTE — ED Triage Notes (Signed)
Patient c/o sore inside her nose that started 4 days ago.

## 2019-11-28 NOTE — ED Provider Notes (Signed)
[facility]    CSN: [id number] Arrival date & time: 11/28/19  1448  History   Chief Complaint Chief Complaint  Patient presents with   nose sore   HPI  59 year old female presents the above complaint.  Patient reports that she developed a sore inside of her right nostril approximately 4 days ago.  It is now red, raised and seems to be worsening.  Pain 6/10 in severity.  No drainage.  No fever.  No relieving factors.  No other complaints.  Past Medical History:  Diagnosis Date   Abdominal wall hernia 01/29/2013   Anxiety    Arthritis    Rheumatoid   C. difficile colitis    Chronic diastolic heart failure (HCC)    COVID-19 03/23/2019   Diagnosed at [facility] (send-out) on 03/23/2019   Depression    Diabetes mellitus    states no meds or diet restrictions  at present   Diastolic CHF (HCC)    Esophagitis    Fluid retention    GERD (gastroesophageal reflux disease)    Hiatal hernia    Hypertension    Hypokalemia due to loss of potassium 10/21/2015   Overview:  Associated with 3 weeks of diarrhea  And QT prolongation.   Hypothyroidism    IBS (irritable bowel syndrome)    Moderate episode of recurrent major depressive disorder (HCC) 06/03/2004   Morbid obesity (HCC)    MRSA (methicillin resistant Staphylococcus aureus) infection 11/2017   left inner thigh abcess   Neurogenic bladder    has pacemaker   Neuropathy    Obesity    Panic attacks    Pneumonia due to COVID-19 virus    Rheumatoid arthritis (HCC)    Sleep apnea    STATES SEVERE, CANT TOLERATE MASK- LAST STUDY YEARS AGO    Patient Active Problem List   Diagnosis Date Noted   Bacteremia    Paroxysmal A-fib (HCC)    Right arm weakness    Ileus (HCC)    Splenic infarct    Acute metabolic encephalopathy 07/08/2019   Hyperglycemia due to type 2 diabetes mellitus (HCC) 07/08/2019   Morbid obesity with BMI of 50.0-59.9, adult (HCC) 07/08/2019   Severe sepsis with septic  shock (HCC) 07/08/2019   Septic shock (HCC) 07/08/2019   History of severe acute respiratory syndrome coronavirus 2 (SARS-CoV-2) disease 05/24/2019   Acute respiratory disease due to COVID-19 virus 04/06/2019   Type 2 diabetes mellitus without complication, without long-term current use of insulin (HCC) 04/06/2019   CKD (chronic kidney disease), stage III 04/06/2019   HLD (hyperlipidemia) 04/06/2019   COVID-19 03/27/2019   Pneumonia due to COVID-19 virus 03/27/2019   Abdominal pain 02/12/2019   AKI (acute kidney injury) (HCC) 02/12/2019   Cellulitis, abdominal wall 02/04/2019   Elevated C-reactive protein (CRP) 12/15/2018   Elevated sed rate 12/15/2018   Pharmacologic therapy 11/06/2018   Disorder of skeletal system 11/06/2018   Problems influencing health status 11/06/2018   Decubitus ulcer of heel, bilateral 02/21/2018   Diabetic ulcer of toe of right foot associated with type 2 diabetes mellitus (HCC) 02/14/2018   Ulcer of left heel and midfoot with fat layer exposed (HCC) 02/14/2018   Sepsis due to methicillin resistant Staphylococcus aureus (MRSA) without acute organ dysfunction (HCC) 11/17/2017   MRSA (methicillin resistant Staphylococcus aureus) infection 11/17/2017   Cellulitis 11/15/2017   Perineal abscess 11/15/2017   Subacute vulvitis 11/01/2017   Chronic cystitis 03/01/2017   Chronic pain syndrome 03/31/2016   Insomnia secondary to chronic  pain 03/31/2016   Chronic upper back pain 12/25/2015   Chronic hand pain (Bilateral) (L>R) 12/25/2015   Rheumatoid arthritis (HCC) 12/25/2015   Osteoarthritis, multiple sites 12/24/2015   Chronic foot pain (Primary Area of Pain) (Bilateral) (L>R) 12/24/2015   Chronic elbow pain (Third area of Pain) (Bilateral) (L>R) 12/24/2015   Chronic shoulder pain (Bilateral) (L>R) 12/24/2015   Chronic neck pain (Bilateral) (R>L) 12/24/2015   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[employer])  12/23/2015   Chronic knee pain (Bilateral) (R>L) 12/23/2015   Long term current use of opiate analgesic 12/23/2015   Long term prescription opiate use 12/23/2015   Opiate use (30 MME/Day) 12/23/2015   Neurogenic pain 12/23/2015   Neuropathic pain 12/23/2015   Diabetic peripheral neuropathy (HCC) 12/23/2015   Encounter for therapeutic drug level monitoring 12/23/2015   Encounter for pain management planning 12/23/2015   GERD (gastroesophageal reflux disease) 11/25/2015   Neuropathy 11/25/2015   Hypokalemia due to loss of potassium 10/21/2015   Hypomagnesemia 10/21/2015   QT prolongation 10/21/2015   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) 10/21/2015   Chronic wrist pain (Secondary area of Pain) (Bilateral) (L>R) 03/19/2015   Adhesive capsulitis 03/19/2015   Female genuine stress incontinence 02/14/2015   Urge incontinence of urine 02/14/2015   OSA (obstructive sleep apnea) 07/03/2014   Chronic diastolic CHF (congestive heart failure) (HCC) 07/03/2014   Anxiety 01/03/2014   Diabetic ulcer of heel (HCC) 01/03/2014   COPD (chronic obstructive pulmonary disease) (HCC) 01/03/2014   Bipolar disorder, unspecified (HCC) 01/03/2014   Diastolic dysfunction 01/03/2014   Combined fat and carbohydrate induced hyperlipemia 01/03/2014   Shortness of breath 01/03/2014   Acute on chronic congestive heart failure (HCC) 01/03/2014   Incomplete bladder emptying 11/02/2012   Bladder retention 10/10/2012   Detrusor muscle hypertonia 10/04/2012   Obstruction of urinary tract 10/04/2012   FOM (frequency of micturition) 10/04/2012   Mixed incontinence 10/04/2012   Vitamin D insufficiency 04/15/2012   Borderline personality disorder (HCC) 01/06/2012   Anxiety and depression 01/06/2012   History of laparoscopic adjustable gastric banding, 03/20/2007.  Removed 09/19/2011. 08/04/2011   Nausea & vomiting 08/04/2011   Hypothyroidism 06/28/2010   Rheumatoid  arthritis involving multiple sites with positive rheumatoid factor (HCC) 06/28/2010   Obesity 03/29/2005   Essential (primary) hypertension 01/27/2005   Major depressive disorder, recurrent episode, moderate (HCC) 06/03/2004    Past Surgical History:  Procedure Laterality Date   ABDOMINAL HYSTERECTOMY     CHOLECYSTECTOMY     DG GREAT TOE RIGHT FOOT  02/23/2018   EYE SURGERY     bilateral cataract extraction with IOL   HERNIA REPAIR     ventral hernia with strangulation   LAPAROSCOPIC GASTRIC BANDING  03/20/07   TEE WITHOUT CARDIOVERSION N/A 07/16/2019   Procedure: TRANSESOPHAGEAL ECHOCARDIOGRAM (TEE);  Surgeon: Debbe Odea, MD;  Location: [facility] ORS;  Service: Cardiovascular;  Laterality: N/A;   TONSILLECTOMY     TUBAL LIGATION      OB History   No obstetric history on file.      Home Medications    Prior to Admission medications   Medication Sig Start Date End Date Taking? Authorizing Provider  ALPRAZolam Prudy Feeler) 0.5 MG tablet Take 1 tablet (0.5 mg total) by mouth 2 (two) times daily as needed for anxiety. 07/17/19  Yes Wieting, Richard, MD  ARIPiprazole (ABILIFY) 5 MG tablet TAKE 1 TABLET BY MOUTH ONCE DAILY. Patient taking differently: Take 5 mg by mouth daily.  02/22/19  Yes Clapacs, Jackquline Denmark, MD  ascorbic acid (VITAMIN C) 500 MG tablet Take 1 tablet (500 mg total) by mouth daily. 05/05/19  Yes Rolly Salter, MD  budesonide (PULMICORT) 0.5 MG/2ML nebulizer solution Take 2 mLs (0.5 mg total) by nebulization 2 (two) times daily. 06/04/19 06/03/20 Yes Salena Saner, MD  busPIRone (BUSPAR) 10 MG tablet TAKE 1 TABLET BY MOUTH TWICE DAILY Patient taking differently: Take 10 mg by mouth 2 (two) times daily.  02/05/19  Yes Clapacs, Jackquline Denmark, MD  famotidine (PEPCID) 20 MG tablet Take 1 tablet (20 mg total) by mouth daily. 05/05/19  Yes Rolly Salter, MD  FLUoxetine (PROZAC) 20 MG capsule Take 1 capsule (20 mg total) by mouth daily. TAKE (1) CAPSULE BY MOUTH ONCE  DAILY. Patient taking differently: Take 20 mg by mouth daily.  05/24/19  Yes Clapacs, Jackquline Denmark, MD  folic acid (FOLVITE) 1 MG tablet Take 1 mg by mouth daily.    Yes [provider]  furosemide (LASIX) 20 MG tablet TAKE (1) TABLET BY MOUTH DAILY AS NEEDED. 09/05/19  Yes Parrett, Tammy S, NP  GNP CALCIUM CITRATE+D MAXIMUM 315-250 MG-UNIT TABS Take 1 tablet by mouth daily. 06/15/19  Yes [provider]  insulin degludec (TRESIBA FLEXTOUCH) 200 UNIT/ML FlexTouch Pen Inject 22 Units into the skin in the morning and at bedtime. 07/17/19  Yes Wieting, Richard, MD  insulin lispro (HUMALOG) 100 UNIT/ML injection Inject 0.08 mLs (8 Units total) into the skin 3 (three) times daily before meals. 07/17/19  Yes Wieting, Richard, MD  ipratropium-albuterol (DUONEB) 0.5-2.5 (3) MG/3ML SOLN Take 3 mLs by nebulization 3 (three) times daily. 05/04/19  Yes Rolly Salter, MD  levothyroxine (SYNTHROID, LEVOTHROID) 88 MCG tablet Take 88 mcg by mouth daily before breakfast.   Yes [provider]  oxyCODONE (OXY IR/ROXICODONE) 5 MG immediate release tablet Take 1 tablet (5 mg total) by mouth every 6 (six) hours as needed for severe pain. Must last 30 days 11/19/19 12/19/19 Yes Delano Metz, MD  polyethylene glycol (MIRALAX / GLYCOLAX) 17 g packet Take 17 g by mouth daily as needed for mild constipation. 05/04/19  Yes Rolly Salter, MD  potassium chloride SA (KLOR-CON) 20 MEQ tablet TAKE 1 TABLET BY MOUTH ONCE DAILY. Patient taking differently: 10 mEq.  09/05/19  Yes Parrett, Tammy S, NP  pramipexole (MIRAPEX) 0.125 MG tablet Take 0.25 mg by mouth at bedtime.    Yes [provider]  pregabalin (LYRICA) 150 MG capsule Take 1 capsule (150 mg total) by mouth 3 (three) times daily. 11/19/19 05/17/20 Yes Delano Metz, MD  QUEtiapine (SEROQUEL) 300 MG tablet TAKE ONE TABLET BY MOUTH AT BEDTIME. Patient taking differently: Take 300 mg by mouth at bedtime. TAKE ONE TABLET BY MOUTH AT BEDTIME.  02/05/19  Yes Clapacs, Jackquline Denmark, MD  zolpidem (AMBIEN) 5 MG tablet TAKE ONE TABLET BY MOUTH AT BEDTIME. 11/20/19  Yes Clapacs, Jackquline Denmark, MD  clindamycin (CLEOCIN) 150 MG capsule Take 3 capsules (450 mg total) by mouth 3 (three) times daily for 7 days. 11/28/19 12/05/19  Tommie Sams, DO  mupirocin ointment (BACTROBAN) 2 % Apply 1 application topically 2 (two) times daily. Intranasal - to affected area. 11/28/19   Tommie Sams, DO  potassium chloride (KLOR-CON) 10 MEQ tablet Take 2 tablets (20 mEq total) by mouth daily for 3 days. 08/02/19 08/05/19  Concha Se, MD  apixaban (ELIQUIS) 5 MG TABS tablet Two tabs po twcie a day for 6 days then one tablet twice a day after  that 07/17/19 11/28/19  Alford Highland, MD    Family History Family History  Problem Relation Age of Onset   Heart failure Father    Bipolar disorder Father    Alcohol abuse Father    Anxiety disorder Father    Depression Father    Heart disease Brother    Heart attack Brother 18       MI s/p stents placed   Anxiety disorder Sister    Depression Sister    Anxiety disorder Sister    Depression Sister    Bipolar disorder Sister    Alcohol abuse Sister    Drug abuse Sister    Heart attack Brother     Social History Social History   Tobacco Use   Smoking status: Former Smoker    Packs/day: 2.00    Years: 27.00    Pack years: 54.00    Types: Cigarettes    Quit date: 07/30/1999    Years since quitting: 20.3   Smokeless tobacco: Never Used  Vaping Use   Vaping Use: Never used  Substance Use Topics   Alcohol use: No   Drug use: No     Allergies   Cephalexin, Codeine, Doxycycline, Propoxyphene, Sulfa antibiotics, Lovenox [enoxaparin sodium], Hydrocodone, and Meropenem   Review of Systems Review of Systems  Constitutional: Negative for fever.  HENT:       Sore in nose.    Physical Exam Triage Vital Signs ED Triage Vitals  Enc Vitals Group     BP 11/28/19 1517 121/80     Pulse Rate  11/28/19 1517 85     Resp 11/28/19 1517 18     Temp 11/28/19 1517 98.2 F (36.8 C)     Temp Source 11/28/19 1517 Oral     SpO2 11/28/19 1517 97 %     Weight 11/28/19 1515 (!) 309 lb (140.2 kg)     Height 11/28/19 1515 5\' 6"  (1.676 m)     Head Circumference --      Peak Flow --      Pain Score 11/28/19 1515 6     Pain Loc --      Pain Edu? --      Excl. in GC? --    Updated Vital Signs BP 121/80 (BP Location: Right Arm)    Pulse 85    Temp 98.2 F (36.8 C) (Oral)    Resp 18    Ht 5\' 6"  (1.676 m)    Wt (!) 140.2 kg    LMP 04/20/2001    SpO2 97%    BMI 49.87 kg/m   Visual Acuity Right Eye Distance:   Left Eye Distance:   Bilateral Distance:    Right Eye Near:   Left Eye Near:    Bilateral Near:     Physical Exam Vitals and nursing note reviewed.  Constitutional:      General: She is not in acute distress.    Appearance: She is obese. She is not ill-appearing.  HENT:     Head: Normocephalic and atraumatic.     Nose:      Comments: Raised, erythematous area at the labeled location.  Very tender to palpation. Eyes:     General:        Right eye: No discharge.        Left eye: No discharge.     Conjunctiva/sclera: Conjunctivae normal.  Pulmonary:     Effort: Pulmonary effort is normal. No respiratory distress.  Neurological:  Mental Status: She is alert.  Psychiatric:        Mood and Affect: Mood normal.        Behavior: Behavior normal.    UC Treatments / Results  Labs (all labs ordered are listed, but only abnormal results are displayed) Labs Reviewed - No data to display  EKG   Radiology No results found.  Procedures Procedures (including critical care time)  Medications Ordered in UC Medications - No data to display  Initial Impression / Assessment and Plan / UC Course  I have reviewed the triage vital signs and the nursing notes.  Pertinent labs & imaging results that were available during my care of the patient were reviewed by me and  considered in my medical decision making (see chart for details).    59 year old female presents with suspected nasal abscess.  Patient has a red, raised area with eschar.  Treating with Bactroban ointment and clindamycin.  Clindamycin use due to patient's multiple allergies.  Advised the patient that if she fails to improve or worsens, she should go to the hospital as she may need to see ear nose and throat.  Final Clinical Impressions(s) / UC Diagnoses   Final diagnoses:  Nasal abscess     Discharge Instructions     Medication as prescribed.  If you worsen or fail to improve, you need to go to the hospital.  Take care  Dr. Adriana Simas    ED Prescriptions    Medication Sig Dispense Auth. Provider   mupirocin ointment (BACTROBAN) 2 % Apply 1 application topically 2 (two) times daily. Intranasal - to affected area. 22 g Caedyn Raygoza G, DO   clindamycin (CLEOCIN) 150 MG capsule Take 3 capsules (450 mg total) by mouth 3 (three) times daily for 7 days. 63 capsule Everlene Other G, DO     PDMP not reviewed this encounter.   Tommie Sams, DO 11/28/19 1726

## 2019-11-28 NOTE — Discharge Instructions (Signed)
Medication as prescribed.  If you worsen or fail to improve, you need to go to the hospital.  Take care  Dr. Adriana Simas

## 2019-11-29 ENCOUNTER — Other Ambulatory Visit: Payer: Self-pay | Admitting: Psychiatry

## 2019-11-29 MED ORDER — CHOLECALCIFEROL (VITAMIN D3) 125 MCG (5,000 UNIT) CAPSULE
ORAL | 0 days
Start: 2019-11-29 — End: 2020-02-25

## 2019-11-30 ENCOUNTER — Encounter: Admit: 2019-11-30 | Discharge: 2019-12-01 | Payer: BLUE CROSS/BLUE SHIELD

## 2019-11-30 DIAGNOSIS — M069 Rheumatoid arthritis, unspecified: Principal | ICD-10-CM

## 2019-11-30 DIAGNOSIS — M0579 Rheumatoid arthritis with rheumatoid factor of multiple sites without organ or systems involvement: Principal | ICD-10-CM

## 2019-11-30 LAB — TOXASSURE SELECT 13 (MW), URINE

## 2019-12-05 ENCOUNTER — Encounter: Admit: 2019-12-05 | Discharge: 2019-12-06 | Payer: BLUE CROSS/BLUE SHIELD | Attending: Family | Primary: Family

## 2019-12-05 DIAGNOSIS — E1165 Type 2 diabetes mellitus with hyperglycemia: Principal | ICD-10-CM

## 2019-12-05 DIAGNOSIS — Z794 Long term (current) use of insulin: Secondary | ICD-10-CM

## 2019-12-05 DIAGNOSIS — Z1231 Encounter for screening mammogram for malignant neoplasm of breast: Principal | ICD-10-CM

## 2019-12-05 DIAGNOSIS — E782 Mixed hyperlipidemia: Principal | ICD-10-CM

## 2019-12-05 DIAGNOSIS — E039 Hypothyroidism, unspecified: Principal | ICD-10-CM

## 2019-12-05 MED ORDER — ATORVASTATIN 80 MG TABLET
ORAL_TABLET | Freq: Every evening | ORAL | 3 refills | 90.00000 days | Status: CP
Start: 2019-12-05 — End: 2020-12-04

## 2019-12-06 ENCOUNTER — Other Ambulatory Visit: Payer: Self-pay | Admitting: Family

## 2019-12-06 DIAGNOSIS — Z1231 Encounter for screening mammogram for malignant neoplasm of breast: Secondary | ICD-10-CM

## 2019-12-07 MED ORDER — PREDNISONE 10 MG TABLET
ORAL_TABLET | Freq: Every day | ORAL | 1 refills | 90.00000 days
Start: 2019-12-07 — End: ?

## 2019-12-08 MED ORDER — PREDNISONE 10 MG TABLET
ORAL_TABLET | Freq: Every day | ORAL | 1 refills | 90.00000 days | Status: CP
Start: 2019-12-08 — End: ?

## 2019-12-11 ENCOUNTER — Ambulatory Visit
Payer: Medicaid Other | Attending: Pain Medicine | Admitting: Student in an Organized Health Care Education/Training Program

## 2019-12-11 ENCOUNTER — Other Ambulatory Visit: Payer: Self-pay

## 2019-12-11 ENCOUNTER — Encounter: Payer: Self-pay | Admitting: Student in an Organized Health Care Education/Training Program

## 2019-12-11 VITALS — BP 116/76 | HR 95 | Temp 97.2°F | Resp 16 | Ht 66.0 in | Wt 320.0 lb

## 2019-12-11 DIAGNOSIS — M05711 Rheumatoid arthritis with rheumatoid factor of right shoulder without organ or systems involvement: Secondary | ICD-10-CM | POA: Diagnosis not present

## 2019-12-11 DIAGNOSIS — M79673 Pain in unspecified foot: Secondary | ICD-10-CM | POA: Insufficient documentation

## 2019-12-11 DIAGNOSIS — M25531 Pain in right wrist: Secondary | ICD-10-CM | POA: Diagnosis present

## 2019-12-11 DIAGNOSIS — M059 Rheumatoid arthritis with rheumatoid factor, unspecified: Secondary | ICD-10-CM | POA: Insufficient documentation

## 2019-12-11 DIAGNOSIS — M05712 Rheumatoid arthritis with rheumatoid factor of left shoulder without organ or systems involvement: Secondary | ICD-10-CM | POA: Insufficient documentation

## 2019-12-11 DIAGNOSIS — G8929 Other chronic pain: Secondary | ICD-10-CM | POA: Insufficient documentation

## 2019-12-11 DIAGNOSIS — M25532 Pain in left wrist: Secondary | ICD-10-CM | POA: Insufficient documentation

## 2019-12-11 DIAGNOSIS — M25529 Pain in unspecified elbow: Secondary | ICD-10-CM

## 2019-12-11 DIAGNOSIS — E1142 Type 2 diabetes mellitus with diabetic polyneuropathy: Secondary | ICD-10-CM | POA: Diagnosis not present

## 2019-12-11 DIAGNOSIS — M792 Neuralgia and neuritis, unspecified: Secondary | ICD-10-CM | POA: Insufficient documentation

## 2019-12-11 DIAGNOSIS — G894 Chronic pain syndrome: Secondary | ICD-10-CM

## 2019-12-11 MED ORDER — OXYCODONE HCL 5 MG PO TABS: 5.0000 mg | ORAL_TABLET | Freq: Four times a day (QID) | ORAL | 0 refills | Status: DC | PRN

## 2019-12-11 NOTE — Progress Notes (Signed)
Nursing Pain Medication Assessment:  Safety precautions to be maintained throughout the outpatient stay will include: orient to surroundings, keep bed in low position, maintain call bell within reach at all times, provide assistance with transfer out of bed and ambulation.  Medication Inspection Compliance: Pill count conducted under aseptic conditions, in front of the patient. Neither the pills nor the bottle was removed from the patient's sight at any time. Once count was completed pills were immediately returned to the patient in their original bottle.  Medication: Oxycodone IR Pill/Patch Count: 58 of 120 pills remain Pill/Patch Appearance: Markings consistent with prescribed medication Bottle Appearance: Standard pharmacy container. Clearly labeled. Filled Date: 08 / 06 / 2021 Last Medication intake:  Today

## 2019-12-11 NOTE — Progress Notes (Signed)
PROVIDER NOTE: Information contained herein reflects review and annotations entered in association with encounter. Interpretation of such information and data should be left to medically-trained personnel. Information provided to patient can be located elsewhere in the medical record under "Patient Instructions". Document created using STT-dictation technology, any transcriptional errors that may result from process are unintentional.    Patient: [name] [name]  Service Category: E/M  Provider: Gillis Santa, MD  DOB: 26-Jul-1960  DOS: 12/11/2019  Specialty: Interventional Pain Management  MRN: [id number]  Setting: Ambulatory outpatient  PCP: [facility]  Type: Established Patient    Referring Provider: [facility]  Location: Office  Delivery: Face-to-face     HPI  Reason for encounter: [name], a 59 y.o. year old female, is here today for evaluation and management of her Diabetic peripheral neuropathy ([place]) [E11.42]. [name] [name] primary complain today is Back Pain (lower) and Wrist Pain (left) Last encounter: Practice (11/19/2019). My last encounter with her was on Visit date not found. Pertinent problems: [name] [name] has Chronic wrist pain (Secondary area of Pain) (Bilateral) (L>R); Chronic knee pain (Bilateral) (R>L); Long term current use of opiate analgesic; Opiate use (30 MME/Day); Neurogenic pain; Neuropathic pain; Diabetic peripheral neuropathy ([place]); Osteoarthritis, multiple sites; Chronic foot pain (Primary Area of Pain) (Bilateral) (L>R); Chronic elbow pain (Third area of Pain) (Bilateral) (L>R); Chronic upper back pain; Chronic pain syndrome; Rheumatoid arthritis involving multiple sites with positive rheumatoid factor ([place]); CKD (chronic kidney disease), stage III; Morbid obesity with BMI of 50.0-59.9, adult ([place]); and Severe sepsis with septic shock ([place]) on their pertinent problem list. Pain Assessment: Severity of Chronic pain is reported as a 4 /10. Location: Back  Lower/bilateral legs down the front to feet. Onset: More than a month ago. Quality: Shooting. Timing: Intermittent. Modifying factor(s): medication. Vitals:  height is 5' 6"  (1.676 m) and weight is 320 lb (145.2 kg) (abnormal). Her temporal temperature is 97.2 F (36.2 C) (abnormal). Her blood pressure is 116/76 and her pulse is 95. Her respiration is 16 and oxygen saturation is 96%.   Marland KitchenHe is an established patient of my colleague, Dr. Dossie Arbour.  She presents today for medication management.  This is my first encounter with her.  Please see previous clinic notes with Dr. Dossie Arbour.  Of note patient was prescribed oxycodone 5 mg every 6 hours as needed for chronic pain syndrome related to diabetic peripheral neuropathy, rheumatoid arthritis.  At last visit, her previous urine toxicology screen was negative for oxycodone.  She was given a 1 month prescription and a repeat urine toxicology screen was ordered.  This repeat urine toxicology screen was also negative for oxycodone.  Patient does not have an explanation for this.  She confirms that she utilizes her medications as prescribed and denies medication noncompliance or diversion.  Given that this patient has a longstanding relationship with my partner, Dr. Dossie Arbour and since this is my first time seeing her, will obtain repeat urine toxicology screen and provide prescription for 1 month.  She will follow-up with my partner, Dr. Dossie Arbour thereafter.  I informed her that if this next repeat urine screen is negative for oxycodone that she will not be a candidate for chronic opioid therapy at this clinic.  Patient endorsed understanding.  She states that her last oxycodone intake was today at 2 PM prior to coming in.  Pharmacotherapy Assessment   11/23/2019  1   11/19/2019  Oxycodone Hcl 5 MG Tablet  120.00  30 Fr Nav  [id number]   [place] (2706)   0/0  30.00 MME  Medicaid   [place]    Monitoring: [place] PMP: PDMP not reviewed this encounter.       Pharmacotherapy: No  side-effects or adverse reactions reported. Compliance: No problems identified. Effectiveness: Clinically acceptable.  Dewayne Shorter, RN  12/11/2019  2:47 PM  Sign when Signing Visit Nursing Pain Medication Assessment:  Safety precautions to be maintained throughout the outpatient stay will include: orient to surroundings, keep bed in low position, maintain call bell within reach at all times, provide assistance with transfer out of bed and ambulation.  Medication Inspection Compliance: Pill count conducted under aseptic conditions, in front of the patient. Neither the pills nor the bottle was removed from the patient's sight at any time. Once count was completed pills were immediately returned to the patient in their original bottle.  Medication: Oxycodone IR Pill/Patch Count: 58 of 120 pills remain Pill/Patch Appearance: Markings consistent with prescribed medication Bottle Appearance: Standard pharmacy container. Clearly labeled. Filled Date: 08 / 06 / 2021 Last Medication intake:  Today    UDS:  Summary  Date Value Ref Range Status  11/28/2019 Note  Final    Comment:    ==================================================================== ToxASSURE Select 13 (MW) ==================================================================== Test                             Result       Flag       Units  Drug Absent but Declared for Prescription Verification   Alprazolam                     Not Detected UNEXPECTED ng/mg creat   Oxycodone                      Not Detected UNEXPECTED ng/mg creat ==================================================================== Test                      Result    Flag   Units      Ref Range   Creatinine              22               mg/dL      >=20 ==================================================================== Declared Medications:  The flagging and interpretation on this report are based on the  following declared medications.  Unexpected results may  arise from  inaccuracies in the declared medications.   **Note: The testing scope of this panel includes these medications:   Alprazolam (Xanax)  Oxycodone   **Note: The testing scope of this panel does not include the  following reported medications:   Albuterol (Duoneb)  Apixaban (Eliquis)  Aripiprazole (Abilify)  Budesonide (Pulmicort)  Buspirone (Buspar)  Calcium  Famotidine (Pepcid)  Fluoxetine (Prozac)  Folic Acid  Furosemide (Lasix)  Insulin Tyler Aas)  Ipratropium (Duoneb)  Levothyroxine (Synthroid)  Polyethylene Glycol (MiraLAX)  Potassium (Klor-Con)  Pramipexole (Mirapex)  Pregabalin (Lyrica)  Quetiapine (Seroquel)  Vitamin C  Vitamin D  Zolpidem (Ambien) ==================================================================== For clinical consultation, please call [phone]. ====================================================================      ROS  Constitutional: Denies any fever or chills Gastrointestinal: No reported hemesis, hematochezia, vomiting, or acute GI distress Musculoskeletal: Low back pain, bilateral leg pain pain multiple joints Neurological: No reported episodes of acute onset apraxia, aphasia, dysarthria, agnosia, amnesia, paralysis, loss of coordination, or loss of consciousness  Medication Review  ALPRAZolam, ARIPiprazole,  Calcium Citrate-Vitamin D, FLUoxetine, QUEtiapine, Vitamin D, apixaban, ascorbic acid, atorvastatin, budesonide, busPIRone, famotidine, folic acid, furosemide, insulin degludec, insulin lispro, ipratropium-albuterol, levothyroxine, mupirocin ointment, oxyCODONE, polyethylene glycol, potassium chloride, potassium chloride SA, pramipexole, pregabalin, and zolpidem  History Review  Allergy: [name] [name] is allergic to cephalexin, codeine, doxycycline, propoxyphene, sulfa antibiotics, lovenox [enoxaparin sodium], hydrocodone, and meropenem. Drug: [name] [name]  reports no history of drug use. Alcohol:  reports no history of  alcohol use. Tobacco:  reports that she quit smoking about 20 years ago. Her smoking use included cigarettes. She has a 54.00 pack-year smoking history. She has never used smokeless tobacco. Social: [name] [name]  reports that she quit smoking about 20 years ago. Her smoking use included cigarettes. She has a 54.00 pack-year smoking history. She has never used smokeless tobacco. She reports that she does not drink alcohol and does not use drugs. Medical:  has a past medical history of Abdominal wall hernia (01/29/2013), Anxiety, Arthritis, C. difficile colitis, Chronic diastolic heart failure ([place]), COVID-19 (03/23/2019), Depression, Diabetes mellitus, Diastolic CHF ([place]), Esophagitis, Fluid retention, GERD (gastroesophageal reflux disease), Hiatal hernia, Hypertension, Hypokalemia due to loss of potassium (10/21/2015), Hypothyroidism, IBS (irritable bowel syndrome), Moderate episode of recurrent major depressive disorder ([place]) (06/03/2004), Morbid obesity ([place]), MRSA (methicillin resistant Staphylococcus aureus) infection (11/2017), Neurogenic bladder, Neuropathy, Obesity, Panic attacks, Pneumonia due to COVID-19 virus, Rheumatoid arthritis ([place]), and Sleep apnea. Surgical: [name] [name]  has a past surgical history that includes Tubal ligation; Tonsillectomy; Cholecystectomy; Abdominal hysterectomy; Laparoscopic gastric banding (03/20/07); Eye surgery; Hernia repair; DG GREAT TOE RIGHT FOOT (02/23/2018); and TEE without cardioversion (N/A, 07/16/2019). Family: family history includes Alcohol abuse in her father and sister; Anxiety disorder in her father, sister, and sister; Bipolar disorder in her father and sister; Depression in her father, sister, and sister; Drug abuse in her sister; Heart attack in her brother; Heart attack (age of onset: 74) in her brother; Heart disease in her brother; Heart failure in her father.  Laboratory Chemistry Profile   Renal Lab Results  Component Value Date   BUN 14 08/13/2019    CREATININE 0.62 08/13/2019   BCR 16 11/16/2018   GFRAA >60 08/13/2019   GFRNONAA >60 08/13/2019     Hepatic Lab Results  Component Value Date   AST 11 (L) 08/13/2019   ALT 11 08/13/2019   ALBUMIN 2.9 (L) 08/13/2019   ALKPHOS 66 08/13/2019   LIPASE 176 (H) 07/13/2019     Electrolytes Lab Results  Component Value Date   NA 136 08/13/2019   K 3.5 08/13/2019   CL 99 08/13/2019   CALCIUM 8.5 (L) 08/13/2019   MG 1.5 (L) 08/02/2019   PHOS 3.3 07/17/2019     Bone Lab Results  Component Value Date   VD25OH 37.36 04/30/2019   25OHVITD1 23 (L) 11/16/2018   25OHVITD2 2.2 11/16/2018   25OHVITD3 21 11/16/2018     Inflammation (CRP: Acute Phase) (ESR: Chronic Phase) Lab Results  Component Value Date   CRP 2.1 (H) 07/17/2019   ESRSEDRATE 65 (H) 07/17/2019   LATICACIDVEN 1.6 07/10/2019       Note: Above Lab results reviewed.  Recent Imaging Review  DG Chest Portable 1 View CLINICAL DATA:  Shortness of breath with loss of smell  EXAM: PORTABLE CHEST 1 VIEW  COMPARISON:  July 08, 2019 chest radiograph and July 09, 2019 CT angiogram chest  FINDINGS: There remains somewhat diffuse interstitial thickening throughout the lungs without frank airspace opacity. No appreciable pleural effusions. Heart upper normal in  size with pulmonary vascularity normal. No bone lesions.  IMPRESSION: Diffuse interstitial thickening throughout the lungs without frank airspace opacity. Question developing fibrosis bilaterally. A degree of residua from atypical organism pneumonia is quite possible. No consolidation or well-defined airspace opacity on this study.  Heart upper normal in size.  No adenopathy evident.  Electronically Signed   By: Lowella Grip III M.D.   On: 08/02/2019 17:54 Note: Reviewed        Physical Exam  General appearance: alert, cooperative and morbidly obese Mental status: Alert, oriented x 3 (person, place, & time)       Respiratory: Oxygen-dependent  COPD Eyes: PERLA Vitals: BP 116/76 (BP Location: Right Arm, Patient Position: Sitting, Cuff Size: Large)   Pulse 95   Temp (!) 97.2 F (36.2 C) (Temporal)   Resp 16   Ht 5' 6"  (1.676 m)   Wt (!) 320 lb (145.2 kg)   LMP 04/20/2001   SpO2 96%   BMI 51.65 kg/m  BMI: Estimated body mass index is 51.65 kg/m as calculated from the following:   Height as of this encounter: 5' 6"  (1.676 m).   Weight as of this encounter: 320 lb (145.2 kg). Ideal: Ideal body weight: 59.3 kg (130 lb 11.7 oz) Adjusted ideal body weight: 93.6 kg (206 lb 7 oz)   Lumbar Spine Area Exam  Skin & Axial Inspection: No masses, redness, or swelling Alignment: Symmetrical Functional ROM: Pain restricted ROM       Stability: No instability detected Muscle Tone/Strength: Functionally intact. No obvious neuro-muscular anomalies detected. Sensory (Neurological): Musculoskeletal pain pattern  Gait & Posture Assessment  Ambulation: Patient came in today in a wheel chair Gait: Relatively normal for age and body habitus Posture: WNL  Lower Extremity Exam    Side: Right lower extremity  Side: Left lower extremity  Stability: No instability observed          Stability: No instability observed          Skin & Extremity Inspection: Skin color, temperature, and hair growth are WNL. No peripheral edema or cyanosis. No masses, redness, swelling, asymmetry, or associated skin lesions. No contractures.  Skin & Extremity Inspection: Skin color, temperature, and hair growth are WNL. No peripheral edema or cyanosis. No masses, redness, swelling, asymmetry, or associated skin lesions. No contractures.  Functional ROM: Pain restricted ROM for hip and knee joints          Functional ROM: Pain restricted ROM for hip and knee joints          Muscle Tone/Strength: Functionally intact. No obvious neuro-muscular anomalies detected.  Muscle Tone/Strength: Functionally intact. No obvious neuro-muscular anomalies detected.  Sensory  (Neurological): Musculoskeletal pain pattern        Sensory (Neurological): Musculoskeletal pain pattern        DTR: Patellar: deferred today Achilles: deferred today Plantar: deferred today  DTR: Patellar: deferred today Achilles: deferred today Plantar: deferred today  Palpation: No palpable anomalies  Palpation: No palpable anomalies    Assessment   Status Diagnosis  Persistent Persistent Persistent 1. Diabetic peripheral neuropathy ([place])   2. Neurogenic pain   3. Chronic elbow pain (Third area of Pain) (Bilateral) (L>R)   4. Rheumatoid arthritis involving both shoulders with positive rheumatoid factor ([facility])   5. Chronic wrist pain (Secondary area of Pain) (Bilateral) (L>R)   6. Chronic foot pain (Primary Area of Pain) (Bilateral) (L>R)   7. Neuropathic pain   8. Rheumatoid arthritis with positive rheumatoid factor, involving unspecified site (  [place])   9. Chronic pain syndrome      Updated Problems: Problem  Morbid Obesity With Bmi of 50.0-59.9, Adult ([facility])  Severe Sepsis With Septic Shock ([facility])  Ckd (Chronic Kidney Disease), Stage III  Chronic Pain Syndrome  Chronic Upper Back Pain  Osteoarthritis, Multiple Sites  Chronic foot pain (Primary Area of Pain) (Bilateral) (L>R)  Chronic elbow pain (Third area of Pain) (Bilateral) (L>R)  Chronic knee pain (Bilateral) (R>L)  Long Term Current Use of Opiate Analgesic  Opiate use (30 MME/Day)  Neurogenic Pain  Neuropathic Pain  Diabetic Peripheral Neuropathy ([facility])  Chronic wrist pain (Secondary area of Pain) (Bilateral) (L>R)  Rheumatoid Arthritis Involving Multiple Sites With Positive Rheumatoid Factor ([facility])    Plan of Care  [name] [name] has a current medication list which includes the following long-term medication(s): aripiprazole, budesonide, famotidine, fluoxetine, furosemide, gnp calcium citrate+d maximum, insulin lispro, ipratropium-albuterol, [START ON 12/23/2019] oxycodone, potassium chloride, potassium chloride  sa, pregabalin, quetiapine, zolpidem, and [DISCONTINUED] apixaban.   Previous 2 urine toxicology screens have been inappropriately negative for oxycodone.  Patient confirms medication compliance and states that she utilizes her medication as prescribed.  She does not understand why her urine toxicology screen was negative.  Last oxycodone intake was today at 2 PM.  We will repeat urine toxicology screen today.  This should be positive for oxycodone and its metabolites.  If it is not, I have informed the patient that she will not be a candidate for chronic opioid therapy at this clinic going further.  Patient endorsed understanding.  Pharmacotherapy (Medications Ordered): Meds ordered this encounter  Medications  . oxyCODONE (OXY IR/ROXICODONE) 5 MG immediate release tablet    Sig: Take 1 tablet (5 mg total) by mouth every 6 (six) hours as needed for severe pain. Must last 30 days    Dispense:  120 tablet    Refill:  0    Chronic Pain: STOP Act (Not applicable) Fill 1 day early if closed on refill date.   Orders:  Orders Placed This Encounter  Procedures  . ToxASSURE Select 13 (MW), Urine    Volume: 30 ml(s). Minimum 3 ml of urine is needed. Document temperature of fresh sample. Indications: Long term (current) use of opiate analgesic [phone])    Order Specific Question:   Release to patient    Answer:   Immediate   Follow-up plan:   Return in about 6 weeks (around 01/20/2020) for Medication Management, in person (Dr Dossie Arbour).   Recent Visits Date Type Provider Dept  11/19/19 Office Visit Milinda Pointer, MD [facility]  Showing recent visits within past 90 days and meeting all other requirements Today's Visits Date Type Provider Dept  12/11/19 Office Visit Gillis Santa, MD [facility]  Showing today's visits and meeting all other requirements Future Appointments Date Type Provider Dept  01/17/20 Appointment Gillis Santa, MD [facility]  Showing  future appointments within next 90 days and meeting all other requirements  I discussed the assessment and treatment plan with the patient. The patient was provided an opportunity to ask questions and all were answered. The patient agreed with the plan and demonstrated an understanding of the instructions.  Patient advised to call back or seek an in-person evaluation if the symptoms or condition worsens.  Duration of encounter: 30 minutes.  Note by: Gillis Santa, MD Date: 12/11/2019; Time: 3:32 PM

## 2019-12-12 ENCOUNTER — Ambulatory Visit
Admit: 2019-12-12 | Discharge: 2019-12-13 | Payer: MEDICAID | Attending: Foot & Ankle Surgery | Primary: Foot & Ankle Surgery

## 2019-12-12 DIAGNOSIS — L03119 Cellulitis of unspecified part of limb: Principal | ICD-10-CM

## 2019-12-12 DIAGNOSIS — S90822A Blister (nonthermal), left foot, initial encounter: Secondary | ICD-10-CM

## 2019-12-12 DIAGNOSIS — M79675 Pain in left toe(s): Secondary | ICD-10-CM

## 2019-12-12 DIAGNOSIS — E11621 Type 2 diabetes mellitus with foot ulcer: Principal | ICD-10-CM

## 2019-12-12 DIAGNOSIS — L97522 Non-pressure chronic ulcer of other part of left foot with fat layer exposed: Secondary | ICD-10-CM

## 2019-12-12 DIAGNOSIS — B351 Tinea unguium: Principal | ICD-10-CM

## 2019-12-12 DIAGNOSIS — M79674 Pain in right toe(s): Secondary | ICD-10-CM

## 2019-12-12 DIAGNOSIS — L089 Local infection of the skin and subcutaneous tissue, unspecified: Principal | ICD-10-CM

## 2019-12-14 LAB — TOXASSURE SELECT 13 (MW), URINE

## 2019-12-17 ENCOUNTER — Encounter: Payer: Medicaid Other | Admitting: Pain Medicine

## 2019-12-19 ENCOUNTER — Ambulatory Visit: Admit: 2019-12-19 | Discharge: 2019-12-20 | Payer: MEDICAID

## 2019-12-19 DIAGNOSIS — E139 Other specified diabetes mellitus without complications: Principal | ICD-10-CM

## 2019-12-19 MED ORDER — METFORMIN ER 500 MG TABLET,EXTENDED RELEASE 24 HR
ORAL_TABLET | 3 refills | 0.00000 days | Status: CP
Start: 2019-12-19 — End: ?

## 2019-12-19 MED ORDER — FREESTYLE LIBRE 2 SENSOR KIT
0 refills | 0.00000 days | Status: CP
Start: 2019-12-19 — End: 2020-12-18

## 2019-12-19 MED ORDER — FREESTYLE LIBRE 2 READER
2 refills | 0 days | Status: CP
Start: 2019-12-19 — End: ?

## 2019-12-31 ENCOUNTER — Other Ambulatory Visit: Payer: Self-pay | Admitting: Psychiatry

## 2019-12-31 MED ORDER — CALCITRATE-VITAMIN D ORAL
0 days
Start: 2019-12-31 — End: ?

## 2019-12-31 MED ORDER — PRAMIPEXOLE 0.125 MG TABLET
ORAL_TABLET | Freq: Every evening | ORAL | 1 refills | 90 days
Start: 2019-12-31 — End: 2020-12-30

## 2019-12-31 MED ORDER — ACCU-CHEK GUIDE TEST STRIPS
Freq: Four times a day (QID) | 1 refills | 0 days | Status: CP
Start: 2019-12-31 — End: 2020-12-30

## 2019-12-31 MED ORDER — LEVOTHYROXINE 88 MCG TABLET
ORAL_TABLET | Freq: Every day | ORAL | 1 refills | 90 days | Status: CP
Start: 2019-12-31 — End: 2020-12-30

## 2019-12-31 MED ORDER — METHOTREXATE SODIUM 2.5 MG TABLET
ORAL_TABLET | ORAL | 2 refills | 28.00000 days | Status: CP
Start: 2019-12-31 — End: 2020-01-30

## 2020-01-02 DIAGNOSIS — L97422 Non-pressure chronic ulcer of left heel and midfoot with fat layer exposed: Principal | ICD-10-CM

## 2020-01-02 DIAGNOSIS — E11621 Type 2 diabetes mellitus with foot ulcer: Principal | ICD-10-CM

## 2020-01-02 MED ORDER — PRAMIPEXOLE 0.125 MG TABLET
ORAL_TABLET | Freq: Every evening | ORAL | 1 refills | 90 days | Status: CP
Start: 2020-01-02 — End: 2021-01-01

## 2020-01-04 ENCOUNTER — Ambulatory Visit: Admit: 2020-01-04 | Discharge: 2020-01-05 | Payer: MEDICAID

## 2020-01-04 ENCOUNTER — Ambulatory Visit
Admit: 2020-01-04 | Discharge: 2020-01-05 | Payer: MEDICAID | Attending: Foot & Ankle Surgery | Primary: Foot & Ankle Surgery

## 2020-01-04 DIAGNOSIS — B351 Tinea unguium: Principal | ICD-10-CM

## 2020-01-04 DIAGNOSIS — M79674 Pain in right toe(s): Secondary | ICD-10-CM

## 2020-01-04 DIAGNOSIS — L03119 Cellulitis of unspecified part of limb: Principal | ICD-10-CM

## 2020-01-04 DIAGNOSIS — L97522 Non-pressure chronic ulcer of other part of left foot with fat layer exposed: Secondary | ICD-10-CM

## 2020-01-04 DIAGNOSIS — M79675 Pain in left toe(s): Principal | ICD-10-CM

## 2020-01-04 DIAGNOSIS — E11621 Type 2 diabetes mellitus with foot ulcer: Secondary | ICD-10-CM

## 2020-01-09 ENCOUNTER — Encounter: Payer: Self-pay | Admitting: *Deleted

## 2020-01-09 ENCOUNTER — Other Ambulatory Visit: Payer: Self-pay

## 2020-01-09 ENCOUNTER — Ambulatory Visit
Admit: 2020-01-09 | Discharge: 2020-01-10 | Payer: MEDICAID | Attending: Infectious Disease | Primary: Infectious Disease

## 2020-01-09 DIAGNOSIS — E11621 Type 2 diabetes mellitus with foot ulcer: Principal | ICD-10-CM

## 2020-01-09 DIAGNOSIS — L97422 Non-pressure chronic ulcer of left heel and midfoot with fat layer exposed: Principal | ICD-10-CM

## 2020-01-09 DIAGNOSIS — Z8249 Family history of ischemic heart disease and other diseases of the circulatory system: Secondary | ICD-10-CM

## 2020-01-09 DIAGNOSIS — F419 Anxiety disorder, unspecified: Secondary | ICD-10-CM | POA: Diagnosis present

## 2020-01-09 DIAGNOSIS — E785 Hyperlipidemia, unspecified: Secondary | ICD-10-CM | POA: Diagnosis present

## 2020-01-09 DIAGNOSIS — Z888 Allergy status to other drugs, medicaments and biological substances status: Secondary | ICD-10-CM

## 2020-01-09 DIAGNOSIS — I5032 Chronic diastolic (congestive) heart failure: Secondary | ICD-10-CM | POA: Diagnosis present

## 2020-01-09 DIAGNOSIS — I110 Hypertensive heart disease with heart failure: Secondary | ICD-10-CM | POA: Diagnosis present

## 2020-01-09 DIAGNOSIS — E1142 Type 2 diabetes mellitus with diabetic polyneuropathy: Secondary | ICD-10-CM | POA: Diagnosis present

## 2020-01-09 DIAGNOSIS — L97529 Non-pressure chronic ulcer of other part of left foot with unspecified severity: Secondary | ICD-10-CM | POA: Diagnosis present

## 2020-01-09 DIAGNOSIS — Z950 Presence of cardiac pacemaker: Secondary | ICD-10-CM

## 2020-01-09 DIAGNOSIS — J9611 Chronic respiratory failure with hypoxia: Secondary | ICD-10-CM | POA: Diagnosis present

## 2020-01-09 DIAGNOSIS — M069 Rheumatoid arthritis, unspecified: Secondary | ICD-10-CM | POA: Diagnosis present

## 2020-01-09 DIAGNOSIS — K219 Gastro-esophageal reflux disease without esophagitis: Secondary | ICD-10-CM | POA: Diagnosis present

## 2020-01-09 DIAGNOSIS — Z87891 Personal history of nicotine dependence: Secondary | ICD-10-CM

## 2020-01-09 DIAGNOSIS — E662 Morbid (severe) obesity with alveolar hypoventilation: Secondary | ICD-10-CM | POA: Diagnosis present

## 2020-01-09 DIAGNOSIS — Z8616 Personal history of COVID-19: Secondary | ICD-10-CM

## 2020-01-09 DIAGNOSIS — E1169 Type 2 diabetes mellitus with other specified complication: Secondary | ICD-10-CM | POA: Diagnosis present

## 2020-01-09 DIAGNOSIS — Z9981 Dependence on supplemental oxygen: Secondary | ICD-10-CM

## 2020-01-09 DIAGNOSIS — E1165 Type 2 diabetes mellitus with hyperglycemia: Secondary | ICD-10-CM | POA: Diagnosis present

## 2020-01-09 DIAGNOSIS — Z7951 Long term (current) use of inhaled steroids: Secondary | ICD-10-CM

## 2020-01-09 DIAGNOSIS — Z79899 Other long term (current) drug therapy: Secondary | ICD-10-CM

## 2020-01-09 DIAGNOSIS — Z882 Allergy status to sulfonamides status: Secondary | ICD-10-CM

## 2020-01-09 DIAGNOSIS — D509 Iron deficiency anemia, unspecified: Secondary | ICD-10-CM | POA: Diagnosis present

## 2020-01-09 DIAGNOSIS — Z794 Long term (current) use of insulin: Secondary | ICD-10-CM

## 2020-01-09 DIAGNOSIS — M86172 Other acute osteomyelitis, left ankle and foot: Secondary | ICD-10-CM | POA: Diagnosis present

## 2020-01-09 DIAGNOSIS — Z79890 Hormone replacement therapy (postmenopausal): Secondary | ICD-10-CM

## 2020-01-09 DIAGNOSIS — Z7952 Long term (current) use of systemic steroids: Secondary | ICD-10-CM

## 2020-01-09 DIAGNOSIS — Z885 Allergy status to narcotic agent status: Secondary | ICD-10-CM

## 2020-01-09 DIAGNOSIS — E039 Hypothyroidism, unspecified: Secondary | ICD-10-CM | POA: Diagnosis present

## 2020-01-09 DIAGNOSIS — F329 Major depressive disorder, single episode, unspecified: Secondary | ICD-10-CM | POA: Diagnosis present

## 2020-01-09 DIAGNOSIS — N179 Acute kidney failure, unspecified: Secondary | ICD-10-CM | POA: Diagnosis present

## 2020-01-09 DIAGNOSIS — Z7901 Long term (current) use of anticoagulants: Secondary | ICD-10-CM

## 2020-01-09 DIAGNOSIS — Z881 Allergy status to other antibiotic agents status: Secondary | ICD-10-CM

## 2020-01-09 DIAGNOSIS — A4102 Sepsis due to Methicillin resistant Staphylococcus aureus: Principal | ICD-10-CM | POA: Diagnosis present

## 2020-01-09 DIAGNOSIS — Z6841 Body Mass Index (BMI) 40.0 and over, adult: Secondary | ICD-10-CM

## 2020-01-09 DIAGNOSIS — Z8701 Personal history of pneumonia (recurrent): Secondary | ICD-10-CM

## 2020-01-09 DIAGNOSIS — L03116 Cellulitis of left lower limb: Secondary | ICD-10-CM | POA: Diagnosis present

## 2020-01-09 DIAGNOSIS — Z8614 Personal history of Methicillin resistant Staphylococcus aureus infection: Secondary | ICD-10-CM

## 2020-01-09 LAB — CBC
HCT: 33.5 % — ABNORMAL LOW (ref 36.0–46.0)
Hemoglobin: 10.3 g/dL — ABNORMAL LOW (ref 12.0–15.0)
MCH: 21.8 pg — ABNORMAL LOW (ref 26.0–34.0)
MCHC: 30.7 g/dL (ref 30.0–36.0)
MCV: 70.8 fL — ABNORMAL LOW (ref 80.0–100.0)
Platelets: 343 10*3/uL (ref 150–400)
RBC: 4.73 MIL/uL (ref 3.87–5.11)
RDW: 20 % — ABNORMAL HIGH (ref 11.5–15.5)
WBC: 16.3 10*3/uL — ABNORMAL HIGH (ref 4.0–10.5)
nRBC: 0 % (ref 0.0–0.2)

## 2020-01-09 LAB — BASIC METABOLIC PANEL
Anion gap: 11 (ref 5–15)
BUN: 29 mg/dL — ABNORMAL HIGH (ref 6–20)
CO2: 24 mmol/L (ref 22–32)
Calcium: 8.8 mg/dL — ABNORMAL LOW (ref 8.9–10.3)
Chloride: 101 mmol/L (ref 98–111)
Creatinine, Ser: 1.05 mg/dL — ABNORMAL HIGH (ref 0.44–1.00)
GFR calc Af Amer: >60 mL/min (ref >60)
GFR calc non Af Amer: 58 mL/min — ABNORMAL LOW (ref >60)
Glucose, Bld: 179 mg/dL — ABNORMAL HIGH (ref 70–99)
Potassium: 4.1 mmol/L (ref 3.5–5.1)
Sodium: 136 mmol/L (ref 135–145)

## 2020-01-09 MED ORDER — LINEZOLID 600 MG TABLET
ORAL_TABLET | Freq: Two times a day (BID) | ORAL | 0 refills | 21.00000 days | Status: CP
Start: 2020-01-09 — End: 2020-01-30

## 2020-01-09 MED ORDER — CIPROFLOXACIN 500 MG TABLET
ORAL_TABLET | Freq: Two times a day (BID) | ORAL | 0 refills | 21.00000 days | Status: CP
Start: 2020-01-09 — End: 2020-01-30

## 2020-01-09 NOTE — ED Triage Notes (Signed)
Pt brought in via ems from home.  Pt has left leg pain.  p has redness and swelling to left lower leg and has an ulcer to bottom of left foot.  Pt was dx with MRSA.  Pt is on 2 liters oxygen due to covid 12/20.  Pt alert.

## 2020-01-10 ENCOUNTER — Inpatient Hospital Stay: Payer: Medicaid Other

## 2020-01-10 ENCOUNTER — Inpatient Hospital Stay
Admission: EM | Admit: 2020-01-10 | Discharge: 2020-01-15 | DRG: 854 | Disposition: A | Discharge status: Home Health | Payer: Medicaid Other | Attending: Internal Medicine | Admitting: Internal Medicine

## 2020-01-10 ENCOUNTER — Emergency Department: Payer: Medicaid Other

## 2020-01-10 DIAGNOSIS — L02612 Cutaneous abscess of left foot: Secondary | ICD-10-CM | POA: Diagnosis not present

## 2020-01-10 DIAGNOSIS — D509 Iron deficiency anemia, unspecified: Secondary | ICD-10-CM | POA: Diagnosis present

## 2020-01-10 DIAGNOSIS — I5032 Chronic diastolic (congestive) heart failure: Secondary | ICD-10-CM | POA: Diagnosis present

## 2020-01-10 DIAGNOSIS — M86172 Other acute osteomyelitis, left ankle and foot: Secondary | ICD-10-CM | POA: Diagnosis present

## 2020-01-10 DIAGNOSIS — L97509 Non-pressure chronic ulcer of other part of unspecified foot with unspecified severity: Secondary | ICD-10-CM

## 2020-01-10 DIAGNOSIS — L97529 Non-pressure chronic ulcer of other part of left foot with unspecified severity: Secondary | ICD-10-CM | POA: Diagnosis present

## 2020-01-10 DIAGNOSIS — E662 Morbid (severe) obesity with alveolar hypoventilation: Secondary | ICD-10-CM | POA: Diagnosis present

## 2020-01-10 DIAGNOSIS — E1169 Type 2 diabetes mellitus with other specified complication: Secondary | ICD-10-CM | POA: Diagnosis present

## 2020-01-10 DIAGNOSIS — A4102 Sepsis due to Methicillin resistant Staphylococcus aureus: Secondary | ICD-10-CM | POA: Diagnosis present

## 2020-01-10 DIAGNOSIS — M795 Residual foreign body in soft tissue: Secondary | ICD-10-CM | POA: Diagnosis not present

## 2020-01-10 DIAGNOSIS — A419 Sepsis, unspecified organism: Secondary | ICD-10-CM

## 2020-01-10 DIAGNOSIS — N179 Acute kidney failure, unspecified: Secondary | ICD-10-CM

## 2020-01-10 DIAGNOSIS — Z6841 Body Mass Index (BMI) 40.0 and over, adult: Secondary | ICD-10-CM | POA: Diagnosis not present

## 2020-01-10 DIAGNOSIS — K219 Gastro-esophageal reflux disease without esophagitis: Secondary | ICD-10-CM | POA: Diagnosis present

## 2020-01-10 DIAGNOSIS — E1142 Type 2 diabetes mellitus with diabetic polyneuropathy: Secondary | ICD-10-CM

## 2020-01-10 DIAGNOSIS — E11621 Type 2 diabetes mellitus with foot ulcer: Secondary | ICD-10-CM | POA: Diagnosis present

## 2020-01-10 DIAGNOSIS — M869 Osteomyelitis, unspecified: Secondary | ICD-10-CM | POA: Diagnosis not present

## 2020-01-10 DIAGNOSIS — L03116 Cellulitis of left lower limb: Secondary | ICD-10-CM

## 2020-01-10 DIAGNOSIS — I110 Hypertensive heart disease with heart failure: Secondary | ICD-10-CM | POA: Diagnosis present

## 2020-01-10 DIAGNOSIS — Z79899 Other long term (current) drug therapy: Secondary | ICD-10-CM | POA: Diagnosis not present

## 2020-01-10 DIAGNOSIS — L0390 Cellulitis, unspecified: Secondary | ICD-10-CM | POA: Diagnosis not present

## 2020-01-10 DIAGNOSIS — E11628 Type 2 diabetes mellitus with other skin complications: Secondary | ICD-10-CM | POA: Diagnosis not present

## 2020-01-10 DIAGNOSIS — E785 Hyperlipidemia, unspecified: Secondary | ICD-10-CM | POA: Diagnosis present

## 2020-01-10 DIAGNOSIS — E039 Hypothyroidism, unspecified: Secondary | ICD-10-CM | POA: Diagnosis present

## 2020-01-10 DIAGNOSIS — Z8616 Personal history of COVID-19: Secondary | ICD-10-CM | POA: Diagnosis not present

## 2020-01-10 DIAGNOSIS — M069 Rheumatoid arthritis, unspecified: Secondary | ICD-10-CM | POA: Diagnosis present

## 2020-01-10 DIAGNOSIS — L03119 Cellulitis of unspecified part of limb: Secondary | ICD-10-CM | POA: Diagnosis present

## 2020-01-10 DIAGNOSIS — J9611 Chronic respiratory failure with hypoxia: Secondary | ICD-10-CM | POA: Diagnosis present

## 2020-01-10 DIAGNOSIS — D72829 Elevated white blood cell count, unspecified: Secondary | ICD-10-CM

## 2020-01-10 DIAGNOSIS — F329 Major depressive disorder, single episode, unspecified: Secondary | ICD-10-CM | POA: Diagnosis present

## 2020-01-10 DIAGNOSIS — E1165 Type 2 diabetes mellitus with hyperglycemia: Secondary | ICD-10-CM | POA: Diagnosis present

## 2020-01-10 DIAGNOSIS — Z8614 Personal history of Methicillin resistant Staphylococcus aureus infection: Secondary | ICD-10-CM | POA: Diagnosis not present

## 2020-01-10 LAB — CBC
HCT: 32.7 % — ABNORMAL LOW (ref 36.0–46.0)
Hemoglobin: 9.8 g/dL — ABNORMAL LOW (ref 12.0–15.0)
MCH: 21.4 pg — ABNORMAL LOW (ref 26.0–34.0)
MCHC: 30 g/dL (ref 30.0–36.0)
MCV: 71.6 fL — ABNORMAL LOW (ref 80.0–100.0)
Platelets: 323 10*3/uL (ref 150–400)
RBC: 4.57 MIL/uL (ref 3.87–5.11)
RDW: 20.3 % — ABNORMAL HIGH (ref 11.5–15.5)
WBC: 16.3 10*3/uL — ABNORMAL HIGH (ref 4.0–10.5)
nRBC: 0 % (ref 0.0–0.2)

## 2020-01-10 LAB — LACTIC ACID, PLASMA
Lactic Acid, Venous: 1.9 mmol/L (ref 0.5–1.9)
Lactic Acid, Venous: 1.3 mmol/L (ref 0.5–1.9)
Lactic Acid, Venous: 1.1 mmol/L (ref 0.5–1.9)
Lactic Acid, Venous: 1.3 mmol/L (ref 0.5–1.9)

## 2020-01-10 LAB — RESPIRATORY PANEL BY RT PCR (FLU A&B, COVID)
Influenza A by PCR: NEGATIVE
Influenza B by PCR: NEGATIVE
SARS Coronavirus 2 by RT PCR: NEGATIVE

## 2020-01-10 LAB — CBC WITH DIFFERENTIAL/PLATELET
Abs Immature Granulocytes: 0.2 10*3/uL — ABNORMAL HIGH (ref 0.00–0.07)
Basophils Absolute: 0.1 10*3/uL (ref 0.0–0.1)
Basophils Relative: 1 %
Eosinophils Absolute: 0 10*3/uL (ref 0.0–0.5)
Eosinophils Relative: 0 %
HCT: 31.5 % — ABNORMAL LOW (ref 36.0–46.0)
Hemoglobin: 9.6 g/dL — ABNORMAL LOW (ref 12.0–15.0)
Immature Granulocytes: 1 %
Lymphocytes Relative: 11 %
Lymphs Abs: 1.6 10*3/uL (ref 0.7–4.0)
MCH: 21.7 pg — ABNORMAL LOW (ref 26.0–34.0)
MCHC: 30.5 g/dL (ref 30.0–36.0)
MCV: 71.3 fL — ABNORMAL LOW (ref 80.0–100.0)
Monocytes Absolute: 1 10*3/uL (ref 0.1–1.0)
Monocytes Relative: 7 %
Neutro Abs: 11.2 10*3/uL — ABNORMAL HIGH (ref 1.7–7.7)
Neutrophils Relative %: 80 %
Platelets: 307 10*3/uL (ref 150–400)
RBC: 4.42 MIL/uL (ref 3.87–5.11)
RDW: 20 % — ABNORMAL HIGH (ref 11.5–15.5)
WBC: 14 10*3/uL — ABNORMAL HIGH (ref 4.0–10.5)
nRBC: 0 % (ref 0.0–0.2)

## 2020-01-10 LAB — PROTIME-INR
INR: 1.2 (ref 0.8–1.2)
INR: 1.2 (ref 0.8–1.2)
Prothrombin Time: 15 s (ref 11.4–15.2)
Prothrombin Time: 14.7 seconds (ref 11.4–15.2)

## 2020-01-10 LAB — CORTISOL: Cortisol, Plasma: 11.8 ug/dL

## 2020-01-10 LAB — BASIC METABOLIC PANEL
Anion gap: 9 (ref 5–15)
BUN: 32 mg/dL — ABNORMAL HIGH (ref 6–20)
CO2: 26 mmol/L (ref 22–32)
Calcium: 8.4 mg/dL — ABNORMAL LOW (ref 8.9–10.3)
Chloride: 103 mmol/L (ref 98–111)
Creatinine, Ser: 1.35 mg/dL — ABNORMAL HIGH (ref 0.44–1.00)
GFR calc Af Amer: 50 mL/min — ABNORMAL LOW (ref >60)
GFR calc non Af Amer: 43 mL/min — ABNORMAL LOW (ref >60)
Glucose, Bld: 169 mg/dL — ABNORMAL HIGH (ref 70–99)
Potassium: 4.2 mmol/L (ref 3.5–5.1)
Sodium: 138 mmol/L (ref 135–145)

## 2020-01-10 LAB — COMPREHENSIVE METABOLIC PANEL
ALT: 17 U/L (ref 0–44)
AST: 19 U/L (ref 15–41)
Albumin: 3 g/dL — ABNORMAL LOW (ref 3.5–5.0)
Alkaline Phosphatase: 70 U/L (ref 38–126)
Anion gap: 9 (ref 5–15)
BUN: 30 mg/dL — ABNORMAL HIGH (ref 6–20)
CO2: 22 mmol/L (ref 22–32)
Calcium: 8.4 mg/dL — ABNORMAL LOW (ref 8.9–10.3)
Chloride: 104 mmol/L (ref 98–111)
Creatinine, Ser: 1 mg/dL (ref 0.44–1.00)
GFR calc Af Amer: >60 mL/min (ref >60)
GFR calc non Af Amer: >60 mL/min (ref >60)
Glucose, Bld: 183 mg/dL — ABNORMAL HIGH (ref 70–99)
Potassium: 3.8 mmol/L (ref 3.5–5.1)
Sodium: 135 mmol/L (ref 135–145)
Total Bilirubin: 1 mg/dL (ref 0.3–1.2)
Total Protein: 7.2 g/dL (ref 6.5–8.1)

## 2020-01-10 LAB — APTT
aPTT: 41 seconds — ABNORMAL HIGH (ref 24–36)
aPTT: 38 seconds — ABNORMAL HIGH (ref 24–36)

## 2020-01-10 LAB — PROCALCITONIN: Procalcitonin: 0.16 ng/mL

## 2020-01-10 LAB — GLUCOSE, CAPILLARY
Glucose-Capillary: 153 mg/dL — ABNORMAL HIGH (ref 70–99)
Glucose-Capillary: 192 mg/dL — ABNORMAL HIGH (ref 70–99)
Glucose-Capillary: 160 mg/dL — ABNORMAL HIGH (ref 70–99)
Glucose-Capillary: 162 mg/dL — ABNORMAL HIGH (ref 70–99)

## 2020-01-10 LAB — HEMOGLOBIN A1C
Hgb A1c MFr Bld: 10.8 % — ABNORMAL HIGH (ref 4.8–5.6)
Mean Plasma Glucose: 263.26 mg/dL

## 2020-01-10 MED ORDER — INSULIN GLARGINE 100 UNIT/ML ~~LOC~~ SOLN
22.0000 [IU] | Freq: Two times a day (BID) | SUBCUTANEOUS | Status: DC
  Administered 2020-01-10 – 2020-01-15 (×11): 22 [IU] via SUBCUTANEOUS
  Filled 2020-01-10 (×14): qty 0.22

## 2020-01-10 MED ORDER — ATORVASTATIN CALCIUM 20 MG PO TABS
80.0000 mg | ORAL_TABLET | Freq: Every day | ORAL | Status: DC
  Administered 2020-01-10 – 2020-01-15 (×6): 80 mg via ORAL
  Filled 2020-01-10 (×6): qty 4

## 2020-01-10 MED ORDER — ARIPIPRAZOLE 5 MG PO TABS
5.0000 mg | ORAL_TABLET | Freq: Every day | ORAL | Status: DC
  Administered 2020-01-10 – 2020-01-15 (×6): 5 mg via ORAL
  Filled 2020-01-10 (×6): qty 1

## 2020-01-10 MED ORDER — FLUOXETINE HCL 20 MG PO CAPS
20.0000 mg | ORAL_CAPSULE | Freq: Every day | ORAL | Status: DC
  Administered 2020-01-10 – 2020-01-15 (×6): 20 mg via ORAL
  Filled 2020-01-10 (×6): qty 1

## 2020-01-10 MED ORDER — ACETAMINOPHEN 650 MG RE SUPP: 650.0000 mg | Freq: Four times a day (QID) | RECTAL | Status: DC | PRN

## 2020-01-10 MED ORDER — FUROSEMIDE 40 MG PO TABS
20.0000 mg | ORAL_TABLET | Freq: Every day | ORAL | Status: DC
  Administered 2020-01-10: 20 mg via ORAL
  Filled 2020-01-10: qty 1

## 2020-01-10 MED ORDER — FAMOTIDINE 20 MG PO TABS
20.0000 mg | ORAL_TABLET | Freq: Every day | ORAL | Status: DC
  Administered 2020-01-10 – 2020-01-15 (×6): 20 mg via ORAL
  Filled 2020-01-10 (×6): qty 1

## 2020-01-10 MED ORDER — VANCOMYCIN HCL 1500 MG/300ML IV SOLN
1500.0000 mg | Freq: Once | INTRAVENOUS | Status: AC
  Administered 2020-01-10: 1500 mg via INTRAVENOUS
  Filled 2020-01-10: qty 300

## 2020-01-10 MED ORDER — POTASSIUM CHLORIDE CRYS ER 10 MEQ PO TBCR
10.0000 meq | EXTENDED_RELEASE_TABLET | Freq: Every day | ORAL | Status: DC
  Administered 2020-01-10 – 2020-01-15 (×6): 10 meq via ORAL
  Filled 2020-01-10 (×6): qty 1

## 2020-01-10 MED ORDER — FENTANYL CITRATE (PF) 100 MCG/2ML IJ SOLN
50.0000 ug | Freq: Once | INTRAMUSCULAR | Status: AC
  Administered 2020-01-10: 50 ug via INTRAVENOUS
  Filled 2020-01-10: qty 2

## 2020-01-10 MED ORDER — ASCORBIC ACID 500 MG PO TABS
500.0000 mg | ORAL_TABLET | Freq: Every day | ORAL | Status: DC
  Administered 2020-01-10 – 2020-01-15 (×6): 500 mg via ORAL
  Filled 2020-01-10 (×6): qty 1

## 2020-01-10 MED ORDER — SODIUM CHLORIDE 0.9 % IV SOLN
2.0000 g | Freq: Every day | INTRAVENOUS | Status: DC
  Administered 2020-01-10 – 2020-01-14 (×5): 2 g via INTRAVENOUS
  Filled 2020-01-10 (×3): qty 2
  Filled 2020-01-10: qty 0.67
  Filled 2020-01-10: qty 2

## 2020-01-10 MED ORDER — FOLIC ACID 1 MG PO TABS
1.0000 mg | ORAL_TABLET | Freq: Every day | ORAL | Status: DC
  Administered 2020-01-10 – 2020-01-15 (×6): 1 mg via ORAL
  Filled 2020-01-10 (×6): qty 1

## 2020-01-10 MED ORDER — IPRATROPIUM-ALBUTEROL 0.5-2.5 (3) MG/3ML IN SOLN
3.0000 mL | Freq: Three times a day (TID) | RESPIRATORY_TRACT | Status: DC
  Administered 2020-01-10: 3 mL via RESPIRATORY_TRACT
  Filled 2020-01-10: qty 3

## 2020-01-10 MED ORDER — VANCOMYCIN HCL IN DEXTROSE 1-5 GM/200ML-% IV SOLN: 1000.0000 mg | Freq: Once | INTRAVENOUS | Status: DC

## 2020-01-10 MED ORDER — PREGABALIN 75 MG PO CAPS
150.0000 mg | ORAL_CAPSULE | Freq: Three times a day (TID) | ORAL | Status: DC
  Administered 2020-01-10 – 2020-01-15 (×15): 150 mg via ORAL
  Filled 2020-01-10 (×15): qty 2

## 2020-01-10 MED ORDER — INSULIN ASPART 100 UNIT/ML ~~LOC~~ SOLN
0.0000 [IU] | Freq: Three times a day (TID) | SUBCUTANEOUS | Status: DC
  Administered 2020-01-10 (×4): 2 [IU] via SUBCUTANEOUS
  Administered 2020-01-11 (×2): 1 [IU] via SUBCUTANEOUS
  Administered 2020-01-12 (×2): 2 [IU] via SUBCUTANEOUS
  Administered 2020-01-12 – 2020-01-13 (×3): 1 [IU] via SUBCUTANEOUS
  Administered 2020-01-14: 2 [IU] via SUBCUTANEOUS
  Administered 2020-01-14: 3 [IU] via SUBCUTANEOUS
  Administered 2020-01-14 (×2): 2 [IU] via SUBCUTANEOUS
  Administered 2020-01-15 (×2): 1 [IU] via SUBCUTANEOUS
  Filled 2020-01-10 (×16): qty 1

## 2020-01-10 MED ORDER — BUSPIRONE HCL 10 MG PO TABS
10.0000 mg | ORAL_TABLET | Freq: Two times a day (BID) | ORAL | Status: DC
  Administered 2020-01-10 – 2020-01-15 (×11): 10 mg via ORAL
  Filled 2020-01-10 (×9): qty 1
  Filled 2020-01-10: qty 2
  Filled 2020-01-10: qty 1

## 2020-01-10 MED ORDER — VANCOMYCIN HCL 1250 MG/250ML IV SOLN
1250.0000 mg | Freq: Two times a day (BID) | INTRAVENOUS | Status: DC
  Administered 2020-01-10 – 2020-01-11 (×2): 1250 mg via INTRAVENOUS
  Filled 2020-01-10 (×5): qty 250

## 2020-01-10 MED ORDER — CALCIUM CITRATE-VITAMIN D 500-500 MG-UNIT PO CHEW
0.5000 | CHEWABLE_TABLET | Freq: Every day | ORAL | Status: DC
  Administered 2020-01-10 – 2020-01-15 (×6): 0.5 via ORAL
  Filled 2020-01-10 (×6): qty 0.5

## 2020-01-10 MED ORDER — ACETAMINOPHEN 325 MG PO TABS
650.0000 mg | ORAL_TABLET | Freq: Four times a day (QID) | ORAL | Status: DC | PRN
  Administered 2020-01-10 – 2020-01-13 (×4): 650 mg via ORAL
  Filled 2020-01-10 (×4): qty 2

## 2020-01-10 MED ORDER — SODIUM CHLORIDE 0.9 % IV SOLN
1.0000 g | Freq: Three times a day (TID) | INTRAVENOUS | Status: DC
  Administered 2020-01-10: 1 g via INTRAVENOUS
  Filled 2020-01-10 (×5): qty 1

## 2020-01-10 MED ORDER — PRAMIPEXOLE DIHYDROCHLORIDE 0.25 MG PO TABS
0.2500 mg | ORAL_TABLET | Freq: Every day | ORAL | Status: DC
  Administered 2020-01-10 – 2020-01-14 (×5): 0.25 mg via ORAL
  Filled 2020-01-10 (×7): qty 1

## 2020-01-10 MED ORDER — VITAMIN D 25 MCG (1000 UNIT) PO TABS
5000.0000 [IU] | ORAL_TABLET | Freq: Every day | ORAL | Status: DC
  Administered 2020-01-10 – 2020-01-15 (×6): 5000 [IU] via ORAL
  Filled 2020-01-10 (×6): qty 5

## 2020-01-10 MED ORDER — OXYCODONE HCL 5 MG PO TABS
5.0000 mg | ORAL_TABLET | Freq: Four times a day (QID) | ORAL | Status: DC | PRN
  Administered 2020-01-10 – 2020-01-14 (×8): 5 mg via ORAL
  Filled 2020-01-10 (×8): qty 1

## 2020-01-10 MED ORDER — SODIUM CHLORIDE 0.9 % IV SOLN: INTRAVENOUS | Status: DC

## 2020-01-10 MED ORDER — BUDESONIDE 0.5 MG/2ML IN SUSP
0.5000 mg | Freq: Two times a day (BID) | RESPIRATORY_TRACT | Status: DC
  Administered 2020-01-10 – 2020-01-15 (×10): 0.5 mg via RESPIRATORY_TRACT
  Filled 2020-01-10 (×10): qty 2

## 2020-01-10 MED ORDER — MAGNESIUM HYDROXIDE 400 MG/5ML PO SUSP: 30.0000 mL | Freq: Every day | ORAL | Status: DC | PRN

## 2020-01-10 MED ORDER — IPRATROPIUM-ALBUTEROL 0.5-2.5 (3) MG/3ML IN SOLN
3.0000 mL | Freq: Three times a day (TID) | RESPIRATORY_TRACT | Status: DC
  Administered 2020-01-10 – 2020-01-12 (×4): 3 mL via RESPIRATORY_TRACT
  Filled 2020-01-10 (×4): qty 3

## 2020-01-10 MED ORDER — VANCOMYCIN HCL IN DEXTROSE 1-5 GM/200ML-% IV SOLN
1000.0000 mg | Freq: Once | INTRAVENOUS | Status: AC
  Administered 2020-01-10: 1000 mg via INTRAVENOUS
  Filled 2020-01-10: qty 200

## 2020-01-10 MED ORDER — MUPIROCIN 2 % EX OINT
1.0000 "application " | TOPICAL_OINTMENT | Freq: Two times a day (BID) | CUTANEOUS | Status: DC
  Administered 2020-01-11 – 2020-01-15 (×9): 1 via TOPICAL
  Filled 2020-01-10 (×3): qty 22

## 2020-01-10 MED ORDER — QUETIAPINE FUMARATE 300 MG PO TABS
300.0000 mg | ORAL_TABLET | Freq: Every day | ORAL | Status: DC
  Administered 2020-01-10 – 2020-01-14 (×5): 300 mg via ORAL
  Filled 2020-01-10 (×6): qty 1

## 2020-01-10 MED ORDER — LEVOTHYROXINE SODIUM 88 MCG PO TABS
88.0000 ug | ORAL_TABLET | Freq: Every day | ORAL | Status: DC
  Administered 2020-01-10 – 2020-01-15 (×6): 88 ug via ORAL
  Filled 2020-01-10 (×7): qty 1

## 2020-01-10 MED ORDER — POLYETHYLENE GLYCOL 3350 17 G PO PACK: 17.0000 g | PACK | Freq: Every day | ORAL | Status: DC | PRN

## 2020-01-10 MED ORDER — ZOLPIDEM TARTRATE 5 MG PO TABS
5.0000 mg | ORAL_TABLET | Freq: Every day | ORAL | Status: DC
  Administered 2020-01-10 – 2020-01-14 (×5): 5 mg via ORAL
  Filled 2020-01-10 (×5): qty 1

## 2020-01-10 MED ORDER — ALPRAZOLAM 0.5 MG PO TABS: 0.5000 mg | ORAL_TABLET | Freq: Two times a day (BID) | ORAL | Status: DC | PRN

## 2020-01-10 MED ORDER — SODIUM CHLORIDE 0.9 % IV BOLUS
1000.0000 mL | Freq: Once | INTRAVENOUS | Status: AC
  Administered 2020-01-10: 1000 mL via INTRAVENOUS

## 2020-01-10 NOTE — ED Notes (Signed)
Report given to receiving nurse. Patient to unit.

## 2020-01-10 NOTE — Consult Note (Signed)
WOC Nurse Consult Note: Reason for Consult: Full thickness neuropathic wound. Patient is followed by both ID and Podiatric Medicine (Dr. Daisey Must) at [facility]. Her last appointment with podiatry was Friday, 9/17. Wound type:Neuropathic plus infection Pressure Injury POA: N/A Measurement:To be taken today by patient's bedside RN just prior to placement of first dressing. Wound bed:Red, moist Drainage (amount, consistency, odor) small serous Periwound:erythematous Dressing procedure/placement/frequency: Conservative orders are provided for Nursing that are consistent with the orders provided by her podiatrist on Friday of last week, that is: cleanse with soap and waterm rinse and dry. Top ulcer with betadine solution soaked gauze, cover with dry gauze and secure with Kerlix/paper tape.  Change twice daily.  I have communicated this morning with Dr. Marland Mcalpine and recommended that a photograph be taken here and uploaded into the patient's EMR and that a referral to podiatry be considered. Perhaps that specialist can coordinate care with her podiatrist in [place].  WOC nursing team will not follow, but will remain available to this patient, the nursing and medical teams.  Please re-consult if needed. Thanks, Ladona Mow, MSN, RN, GNP, [place]  Pager# [phone]

## 2020-01-10 NOTE — Progress Notes (Signed)
Pharmacy - Brief Note  On aztreonam but has tolerated ceftriaxone (in 2020) and cefepime (in 2020 and 2021).  Discussed with physician, and changed aztreonam to ceftriaxone.  Await OR cultures (also on vancomycin)   Juliette Alcide, PharmD, BCPS.   Work Cell: [phone] 01/10/2020 5:31 PM

## 2020-01-10 NOTE — Consult Note (Signed)
CODE SEPSIS - PHARMACY COMMUNICATION  **Broad Spectrum Antibiotics should be administered within 1 hour of Sepsis diagnosis**  Time Code Sepsis Called/Page Received: 827  Antibiotics Ordered: Vancomycin  Time of 1st antibiotic administration: 0358  Additional action taken by pharmacy: none  If necessary, Name of Provider/Nurse Contacted: n/a    Albina Billet ,PharmD Clinical Pharmacist  01/10/2020  8:41 AM

## 2020-01-10 NOTE — Progress Notes (Signed)
F/u CT and xray.  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  D/W pt results and plan for I & D and possible bone excision and biopsy tomorrow.  Orders placed for OR tomorrow.

## 2020-01-10 NOTE — ED Notes (Signed)
US at bedside

## 2020-01-10 NOTE — ED Provider Notes (Signed)
[facility] Emergency Department Provider Note _______   First MD Initiated Contact with Patient 01/10/20 0330     (approximate)  I have reviewed the triage vital signs and the nursing notes.   HISTORY  Chief Complaint Leg Pain    HPI [name] [name] is a 59 y.o. female with below list of previous medical conditions presents to the emergency department secondary to a 3-day history of left lower extremity pain swelling redness and worsening discomfort.  Patient also admits to subjective fevers chills.  Patient also admits to an ulcer on the base of her left great toe which patient states was MRSA positive.        Past Medical History:  Diagnosis Date  . Abdominal wall hernia 01/29/2013  . Anxiety   . Arthritis    Rheumatoid  . C. difficile colitis   . Chronic diastolic heart failure (HCC)   . COVID-19 03/23/2019   Diagnosed at [facility] (send-out) on 03/23/2019  . Depression   . Diabetes mellitus    states no meds or diet restrictions  at present  . Diastolic CHF (HCC)   . Esophagitis   . Fluid retention   . GERD (gastroesophageal reflux disease)   . Hiatal hernia   . Hypertension   . Hypokalemia due to loss of potassium 10/21/2015   Overview:  Associated with 3 weeks of diarrhea  And QT prolongation.  . Hypothyroidism   . IBS (irritable bowel syndrome)   . Moderate episode of recurrent major depressive disorder (HCC) 06/03/2004  . Morbid obesity (HCC)   . MRSA (methicillin resistant Staphylococcus aureus) infection 11/2017   left inner thigh abcess  . Neurogenic bladder    has pacemaker  . Neuropathy   . Obesity   . Panic attacks   . Pneumonia due to COVID-19 virus   . Rheumatoid arthritis (HCC)   . Sleep apnea    STATES SEVERE, CANT TOLERATE MASK- LAST STUDY YEARS AGO    Patient Active Problem List   Diagnosis Date Noted  . Bacteremia   . Paroxysmal A-fib (HCC)   . Right arm weakness   . Ileus (HCC)   . Splenic infarct   . Acute  metabolic encephalopathy 07/08/2019  . Hyperglycemia due to type 2 diabetes mellitus (HCC) 07/08/2019  . Morbid obesity with BMI of 50.0-59.9, adult (HCC) 07/08/2019  . Severe sepsis with septic shock (HCC) 07/08/2019  . Septic shock (HCC) 07/08/2019  . History of severe acute respiratory syndrome coronavirus 2 (SARS-CoV-2) disease 05/24/2019  . Acute respiratory disease due to COVID-19 virus 04/06/2019  . Type 2 diabetes mellitus without complication, without long-term current use of insulin (HCC) 04/06/2019  . CKD (chronic kidney disease), stage III 04/06/2019  . HLD (hyperlipidemia) 04/06/2019  . COVID-19 03/27/2019  . Pneumonia due to COVID-19 virus 03/27/2019  . Abdominal pain 02/12/2019  . AKI (acute kidney injury) (HCC) 02/12/2019  . Cellulitis, abdominal wall 02/04/2019  . Elevated C-reactive protein (CRP) 12/15/2018  . Elevated sed rate 12/15/2018  . Pharmacologic therapy 11/06/2018  . Disorder of skeletal system 11/06/2018  . Problems influencing health status 11/06/2018  . Decubitus ulcer of heel, bilateral 02/21/2018  . Diabetic ulcer of toe of right foot associated with type 2 diabetes mellitus (HCC) 02/14/2018  . Ulcer of left heel and midfoot with fat layer exposed (HCC) 02/14/2018  . Sepsis due to methicillin resistant Staphylococcus aureus (MRSA) without acute organ dysfunction (HCC) 11/17/2017  . MRSA (methicillin resistant Staphylococcus aureus) infection 11/17/2017  .  Cellulitis 11/15/2017  . Perineal abscess 11/15/2017  . Subacute vulvitis 11/01/2017  . Chronic cystitis 03/01/2017  . Chronic pain syndrome 03/31/2016  . Insomnia secondary to chronic pain 03/31/2016  . Chronic upper back pain 12/25/2015  . Chronic hand pain (Bilateral) (L>R) 12/25/2015  . Rheumatoid arthritis (HCC) 12/25/2015  . Osteoarthritis, multiple sites 12/24/2015  . Chronic foot pain (Primary Area of Pain) (Bilateral) (L>R) 12/24/2015  . Chronic elbow pain (Third area of Pain) (Bilateral)  (L>R) 12/24/2015  . Chronic shoulder pain (Bilateral) (L>R) 12/24/2015  . Chronic neck pain (Bilateral) (R>L) 12/24/2015  . 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[employer]) 12/23/2015  . Chronic knee pain (Bilateral) (R>L) 12/23/2015  . Long term current use of opiate analgesic 12/23/2015  . Long term prescription opiate use 12/23/2015  . Opiate use (30 MME/Day) 12/23/2015  . Neurogenic pain 12/23/2015  . Neuropathic pain 12/23/2015  . Diabetic peripheral neuropathy (HCC) 12/23/2015  . Encounter for therapeutic drug level monitoring 12/23/2015  . Encounter for pain management planning 12/23/2015  . GERD (gastroesophageal reflux disease) 11/25/2015  . Neuropathy 11/25/2015  . Hypokalemia due to loss of potassium 10/21/2015  . Hypomagnesemia 10/21/2015  . QT prolongation 10/21/2015  . 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 Chronic wrist pain (Secondary area of Pain) (Bilateral) (L>R) 03/19/2015  . 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 Female genuine stress incontinence 02/14/2015  . Urge incontinence of urine 02/14/2015  . OSA (obstructive sleep apnea) 07/03/2014  . Chronic diastolic CHF (congestive heart failure) (HCC) 07/03/2014  . Anxiety 01/03/2014  . Diabetic ulcer of heel (HCC) 01/03/2014  . COPD (chronic obstructive pulmonary disease) (HCC) 01/03/2014  . Bipolar disorder, unspecified (HCC) 01/03/2014  . Diastolic dysfunction 01/03/2014  . Combined fat and carbohydrate induced hyperlipemia 01/03/2014  . Shortness of breath 01/03/2014  . Acute on chronic congestive heart failure (HCC) 01/03/2014  . Incomplete bladder emptying 11/02/2012  . Bladder retention 10/10/2012  . Detrusor muscle hypertonia 10/04/2012  . Obstruction of urinary tract 10/04/2012  . FOM (frequency of micturition) 10/04/2012  . Mixed incontinence 10/04/2012  . Vitamin D insufficiency 04/15/2012  . Borderline personality disorder (HCC) 01/06/2012  . Anxiety and  depression 01/06/2012  . History of laparoscopic adjustable gastric banding, 03/20/2007.  Removed 09/19/2011. 08/04/2011  . Nausea & vomiting 08/04/2011  . Hypothyroidism 06/28/2010  . Rheumatoid arthritis involving multiple sites with positive rheumatoid factor (HCC) 06/28/2010  . Obesity 03/29/2005  . Essential (primary) hypertension 01/27/2005  . Major depressive disorder, recurrent episode, moderate (HCC) 06/03/2004    Past Surgical History:  Procedure Laterality Date  . ABDOMINAL HYSTERECTOMY    . CHOLECYSTECTOMY    . DG GREAT TOE RIGHT FOOT  02/23/2018  . EYE SURGERY     bilateral cataract extraction with IOL  . HERNIA REPAIR     ventral hernia with strangulation  . LAPAROSCOPIC GASTRIC BANDING  03/20/07  . TEE WITHOUT CARDIOVERSION N/A 07/16/2019   Procedure: TRANSESOPHAGEAL ECHOCARDIOGRAM (TEE);  Surgeon: Debbe Odea, MD;  Location: [facility] ORS;  Service: Cardiovascular;  Laterality: N/A;  . TONSILLECTOMY    . TUBAL LIGATION      Prior to Admission medications   Medication Sig Start Date End Date Taking? Authorizing Provider  ALPRAZolam Prudy Feeler) 0.5 MG tablet Take 1 tablet (0.5 mg total) by mouth 2 (two) times daily as needed for anxiety. 07/17/19   Alford Highland, MD  ARIPiprazole (ABILIFY) 5 MG tablet TAKE 1 TABLET BY MOUTH ONCE DAILY. Patient taking differently: Take 5 mg by  mouth daily.  02/22/19   Clapacs, Jackquline Denmark, MD  ascorbic acid (VITAMIN C) 500 MG tablet Take 1 tablet (500 mg total) by mouth daily. 05/05/19   Rolly Salter, MD  atorvastatin (LIPITOR) 80 MG tablet Take by mouth. 12/05/19 12/04/20  [provider]  budesonide (PULMICORT) 0.5 MG/2ML nebulizer solution Take 2 mLs (0.5 mg total) by nebulization 2 (two) times daily. 06/04/19 06/03/20  Salena Saner, MD  busPIRone (BUSPAR) 10 MG tablet Take 1 tablet (10 mg total) by mouth 2 (two) times daily. 01/03/20 04/02/20  Clapacs, Jackquline Denmark, MD  Cholecalciferol (VITAMIN D) 125 MCG (5000 UT) CAPS Take 1 capsule  by mouth daily. 11/29/19   [provider]  famotidine (PEPCID) 20 MG tablet Take 1 tablet (20 mg total) by mouth daily. 05/05/19   Rolly Salter, MD  FLUoxetine (PROZAC) 20 MG capsule TAKE (1) CAPSULE BY MOUTH ONCE DAILY. 12/06/19   Clapacs, Jackquline Denmark, MD  folic acid (FOLVITE) 1 MG tablet Take 1 mg by mouth daily.     [provider]  furosemide (LASIX) 20 MG tablet TAKE (1) TABLET BY MOUTH DAILY AS NEEDED. 09/05/19   Parrett, Virgel Bouquet, NP  GNP CALCIUM CITRATE+D MAXIMUM 315-250 MG-UNIT TABS Take 1 tablet by mouth daily. 06/15/19   [provider]  insulin degludec (TRESIBA FLEXTOUCH) 200 UNIT/ML FlexTouch Pen Inject 22 Units into the skin in the morning and at bedtime. 07/17/19   Alford Highland, MD  insulin lispro (HUMALOG) 100 UNIT/ML injection Inject 0.08 mLs (8 Units total) into the skin 3 (three) times daily before meals. 07/17/19   Wieting, Richard, MD  ipratropium-albuterol (DUONEB) 0.5-2.5 (3) MG/3ML SOLN Take 3 mLs by nebulization 3 (three) times daily. 05/04/19   Rolly Salter, MD  levothyroxine (SYNTHROID, LEVOTHROID) 88 MCG tablet Take 88 mcg by mouth daily before breakfast.    [provider]  mupirocin ointment (BACTROBAN) 2 % Apply 1 application topically 2 (two) times daily. Intranasal - to affected area. 11/28/19   Tommie Sams, DO  oxyCODONE (OXY IR/ROXICODONE) 5 MG immediate release tablet Take 1 tablet (5 mg total) by mouth every 6 (six) hours as needed for severe pain. Must last 30 days 12/23/19 01/22/20  Edward Jolly, MD  polyethylene glycol (MIRALAX / GLYCOLAX) 17 g packet Take 17 g by mouth daily as needed for mild constipation. 05/04/19   Rolly Salter, MD  potassium chloride (KLOR-CON) 10 MEQ tablet Take 2 tablets (20 mEq total) by mouth daily for 3 days. 08/02/19 08/05/19  Concha Se, MD  potassium chloride SA (KLOR-CON) 20 MEQ tablet TAKE 1 TABLET BY MOUTH ONCE DAILY. Patient taking differently: 10 mEq.  09/05/19   Parrett, Virgel Bouquet, NP    pramipexole (MIRAPEX) 0.125 MG tablet Take 0.25 mg by mouth at bedtime.     [provider]  pregabalin (LYRICA) 150 MG capsule Take 1 capsule (150 mg total) by mouth 3 (three) times daily. 11/19/19 05/17/20  Delano Metz, MD  QUEtiapine (SEROQUEL) 300 MG tablet TAKE ONE TABLET BY MOUTH AT BEDTIME. 01/03/20   Clapacs, Jackquline Denmark, MD  zolpidem (AMBIEN) 5 MG tablet TAKE ONE TABLET BY MOUTH AT BEDTIME. 11/20/19   Clapacs, Jackquline Denmark, MD  apixaban (ELIQUIS) 5 MG TABS tablet Two tabs po twcie a day for 6 days then one tablet twice a day after that 07/17/19 11/28/19  Alford Highland, MD    Allergies Cephalexin, Codeine, Doxycycline, Propoxyphene, Sulfa antibiotics, Lovenox [enoxaparin sodium], Hydrocodone, and Meropenem  Family History  Problem Relation Age of Onset  . Heart failure Father   . Bipolar disorder Father   . Alcohol abuse Father   . Anxiety disorder Father   . Depression Father   . Heart disease Brother   . Heart attack Brother 46       MI s/p stents placed  . Anxiety disorder Sister   . Depression Sister   . Anxiety disorder Sister   . Depression Sister   . Bipolar disorder Sister   . Alcohol abuse Sister   . Drug abuse Sister   . Heart attack Brother     Social History Social History   Tobacco Use  . Smoking status: Former Smoker    Packs/day: 2.00    Years: 27.00    Pack years: 54.00    Types: Cigarettes    Quit date: 07/30/1999    Years since quitting: 20.4  . Smokeless tobacco: Never Used  Vaping Use  . Vaping Use: Never used  Substance Use Topics  . Alcohol use: No  . Drug use: No    Review of Systems Constitutional: Positive for fever/chills Eyes: No visual changes. ENT: No sore throat. Cardiovascular: Denies chest pain. Respiratory: Denies shortness of breath. Gastrointestinal: No abdominal pain.  No nausea, no vomiting.  No diarrhea.  No constipation. Genitourinary: Negative for dysuria. Musculoskeletal: Positive for left lower extremity pain  swelling and redness.  Positive for ulcer left foot Integumentary: Negative for rash. Neurological: Negative for headaches, focal weakness or numbness.   ____________________________________________   PHYSICAL EXAM:  VITAL SIGNS: ED Triage Vitals  Enc Vitals Group     BP 01/09/20 2223 (!) 99/55     Pulse Rate 01/09/20 2220 (!) 116     Resp 01/09/20 2220 20     Temp 01/09/20 2220 100 F (37.8 C)     Temp Source 01/09/20 2220 Oral     SpO2 01/09/20 2223 95 %     Weight 01/09/20 2220 (!) 145.2 kg (320 lb)     Height 01/09/20 2220 1.676 m (5\' 6" )     Head Circumference --      Peak Flow --      Pain Score 01/09/20 2220 6     Pain Loc --      Pain Edu? --      Excl. in GC? --     Constitutional: Alert and oriented.  Eyes: Conjunctivae are normal.  Head: Atraumatic. Mouth/Throat: Patient is wearing a mask. Neck: No stridor.  No meningeal signs.   Cardiovascular: Normal rate, regular rhythm. Good peripheral circulation. Grossly normal heart sounds. Respiratory: Normal respiratory effort.  No retractions. Gastrointestinal: Soft and nontender. No distention.  Musculoskeletal: Left lower extremity swelling redness hot to touch extending from foot to distal medial thigh.  Ulcer noted plantar surface at the base of the left great toe. Neurologic:  Normal speech and language. No gross focal neurologic deficits are appreciated.  Skin: Left lower extremity swelling redness hot to touch extending from foot to distal medial thigh.  Ulcer noted plantar surface at the base of the left great toe. Psychiatric: Mood and affect are normal. Speech and behavior are normal.  ____________________________________________   LABS (all labs ordered are listed, but only abnormal results are displayed)  Labs Reviewed  BASIC METABOLIC PANEL - Abnormal; Notable for the following components:      Result Value   Glucose, Bld 179 (*)    BUN 29 (*)    Creatinine, Ser 1.05 (*)  Calcium 8.8 (*)     GFR calc non Af Amer 58 (*)    All other components within normal limits  CBC - Abnormal; Notable for the following components:   WBC 16.3 (*)    Hemoglobin 10.3 (*)    HCT 33.5 (*)    MCV 70.8 (*)    MCH 21.8 (*)    RDW 20.0 (*)    All other components within normal limits  CULTURE, BLOOD (ROUTINE X 2)  CULTURE, BLOOD (ROUTINE X 2)  LACTIC ACID, PLASMA  LACTIC ACID, PLASMA      .Critical Care Performed by: Darci Current, MD Authorized by: Darci Current, MD   Critical care provider statement:    Critical care time (minutes):  30   Critical care time was exclusive of:  Separately billable procedures and treating other patients   Critical care was necessary to treat or prevent imminent or life-threatening deterioration of the following conditions:  Sepsis   Critical care was time spent personally by me on the following activities:  Development of treatment plan with patient or surrogate, discussions with consultants, evaluation of patient's response to treatment, examination of patient, obtaining history from patient or surrogate, ordering and performing treatments and interventions, ordering and review of laboratory studies, ordering and review of radiographic studies, pulse oximetry, re-evaluation of patient's condition and review of old charts     ____________________________________________   INITIAL IMPRESSION / MDM / ASSESSMENT AND PLAN / ED COURSE  As part of my medical decision making, I reviewed the following data within the electronic MEDICAL RECORD NUMBER  59 year old female presented with above-stated history and physical exam consistent with left lower extremity cellulitis and meeting sepsis criteria given fever, tachycardia, hypotension, leukocytosis and active infection.  As such sepsis protocol was initiated.  Given reported history of MRSA in the left foot ulcer vancomycin IV was administered.  Patient discussed with Dr. Cristina Gong for hospital admission for further  evaluation and management. ____________________________________________  FINAL CLINICAL IMPRESSION(S) / ED DIAGNOSES  Final diagnoses:  Left leg cellulitis  Sepsis, due to unspecified organism, unspecified whether acute organ dysfunction present [facility])     MEDICATIONS GIVEN DURING THIS VISIT:  Medications  vancomycin (VANCOCIN) IVPB 1000 mg/200 mL premix (has no administration in time range)  fentaNYL (SUBLIMAZE) injection 50 mcg (has no administration in time range)  sodium chloride 0.9 % bolus 1,000 mL (0 mLs Intravenous Stopped 01/10/20 [id number])     ED Discharge Orders    None      *Please note:  STAISHA WINIARSKI was evaluated in Emergency Department on 01/10/2020 for the symptoms described in the history of present illness. She was evaluated in the context of the global COVID-19 pandemic, which necessitated consideration that the patient might be at risk for infection with the SARS-CoV-2 virus that causes COVID-19. Institutional protocols and algorithms that pertain to the evaluation of patients at risk for COVID-19 are in a state of rapid change based on information released by regulatory bodies including the CDC and federal and state organizations. These policies and algorithms were followed during the patient's care in the ED.  Some ED evaluations and interventions may be delayed as a result of limited staffing during and after the pandemic.*  Note:  This document was prepared using Dragon voice recognition software and may include unintentional dictation errors.   Darci Current, MD 01/10/20 [phone]

## 2020-01-10 NOTE — H&P (Addendum)
[place]   PATIENT NAME: [name] [name]    MR#:  [id number]  DATE OF BIRTH:  November 24, 1960  DATE OF ADMISSION:  01/10/2020  PRIMARY CARE PHYSICIAN: [facility]   REQUESTING/REFERRING PHYSICIAN: Bayard Males, MD CHIEF COMPLAINT:   Chief Complaint  Patient presents with  . Leg Pain    HISTORY OF PRESENT ILLNESS:  [name] [name]  is a 59 y.o. female, coming from home with a known history of multiple medical problems that are mentioned below, who presented to the emergency room with acute onset of worsening left lower extremity swelling, erythema, tenderness and pain that extended from her left foot to her upper leg.  She has a left plantar big toe ulcer that she stated was MRSA positive.  She admitted to chills but denied any measured fever.  She denied any chest pain or dyspnea or cough or wheezing.  No nausea or vomiting or abdominal pain.  Upon presentation to the emergency room, blood pressure was 99/55 with a pulse of 116 and pulse oximetry of 95% on 2 L of O2 by nasal cannula and temperature was 100.  Labs revealed leukocytosis of 16.3 with anemia and BMP showed a blood glucose of 179.  Lactic acid was 1.9.  Blood cultures were drawn.  The patient was given IV fentanyl as well as 1 L bolus of IV normal saline and IV vancomycin.  She will be admitted to a medical bed for further evaluation and management.  PAST MEDICAL HISTORY:   Past Medical History:  Diagnosis Date  . Abdominal wall hernia 01/29/2013  . Anxiety   . Arthritis    Rheumatoid  . C. difficile colitis   . Chronic diastolic heart failure (HCC)   . COVID-19 03/23/2019   Diagnosed at [facility] (send-out) on 03/23/2019  . Depression   . Diabetes mellitus    states no meds or diet restrictions  at present  . Diastolic CHF (HCC)   . Esophagitis   . Fluid retention   . GERD (gastroesophageal reflux disease)   . Hiatal hernia   . Hypertension   . Hypokalemia due to loss of potassium 10/21/2015    Overview:  Associated with 3 weeks of diarrhea  And QT prolongation.  . Hypothyroidism   . IBS (irritable bowel syndrome)   . Moderate episode of recurrent major depressive disorder (HCC) 06/03/2004  . Morbid obesity (HCC)   . MRSA (methicillin resistant Staphylococcus aureus) infection 11/2017   left inner thigh abcess  . Neurogenic bladder    has pacemaker  . Neuropathy   . Obesity   . Panic attacks   . Pneumonia due to COVID-19 virus   . Rheumatoid arthritis (HCC)   . Sleep apnea    STATES SEVERE, CANT TOLERATE MASK- LAST STUDY YEARS AGO    PAST SURGICAL HISTORY:   Past Surgical History:  Procedure Laterality Date  . ABDOMINAL HYSTERECTOMY    . CHOLECYSTECTOMY    . DG GREAT TOE RIGHT FOOT  02/23/2018  . EYE SURGERY     bilateral cataract extraction with IOL  . HERNIA REPAIR     ventral hernia with strangulation  . LAPAROSCOPIC GASTRIC BANDING  03/20/07  . TEE WITHOUT CARDIOVERSION N/A 07/16/2019   Procedure: TRANSESOPHAGEAL ECHOCARDIOGRAM (TEE);  Surgeon: Debbe Odea, MD;  Location: [facility] ORS;  Service: Cardiovascular;  Laterality: N/A;  . TONSILLECTOMY    . TUBAL LIGATION      SOCIAL HISTORY:   Social History   Tobacco Use  .  Smoking status: Former Smoker    Packs/day: 2.00    Years: 27.00    Pack years: 54.00    Types: Cigarettes    Quit date: 07/30/1999    Years since quitting: 20.4  . Smokeless tobacco: Never Used  Substance Use Topics  . Alcohol use: No    FAMILY HISTORY:   Family History  Problem Relation Age of Onset  . Heart failure Father   . Bipolar disorder Father   . Alcohol abuse Father   . Anxiety disorder Father   . Depression Father   . Heart disease Brother   . Heart attack Brother 45       MI s/p stents placed  . Anxiety disorder Sister   . Depression Sister   . Anxiety disorder Sister   . Depression Sister   . Bipolar disorder Sister   . Alcohol abuse Sister   . Drug abuse Sister   . Heart attack Brother     DRUG  ALLERGIES:   Allergies  Allergen Reactions  . Cephalexin Hives  . Codeine Palpitations, Nausea Only, Nausea And Vomiting, Rash and Shortness Of Breath    "makes heart fly, she gets flushed and passes out"  . Doxycycline Rash  . Propoxyphene Rash and Shortness Of Breath    Increase heart rate  . Sulfa Antibiotics Palpitations, Nausea Only, Shortness Of Breath and Hives    "makes heart fly, she gets flushed and passes out"  . Lovenox [Enoxaparin Sodium] Hives  . Hydrocodone Nausea And Vomiting    Hear racing & breaks out into a cold sweat.  . Meropenem Rash    Erythematous, hot, pruritic rash over arms, chest, back, abdomen, and face occurred at the end of meropenem infusion on 02/22/18    REVIEW OF SYSTEMS:   ROS As per history of present illness. All pertinent systems were reviewed above. Constitutional, HEENT, cardiovascular, respiratory, GI, GU, musculoskeletal, neuro, psychiatric, endocrine, integumentary and hematologic systems were reviewed and are otherwise negative/unremarkable except for positive findings mentioned above in the HPI.   MEDICATIONS AT HOME:   Prior to Admission medications   Medication Sig Start Date End Date Taking? Authorizing Provider  ALPRAZolam Prudy Feeler) 0.5 MG tablet Take 1 tablet (0.5 mg total) by mouth 2 (two) times daily as needed for anxiety. 07/17/19   Alford Highland, MD  ARIPiprazole (ABILIFY) 5 MG tablet TAKE 1 TABLET BY MOUTH ONCE DAILY. Patient taking differently: Take 5 mg by mouth daily.  02/22/19   Clapacs, Jackquline Denmark, MD  ascorbic acid (VITAMIN C) 500 MG tablet Take 1 tablet (500 mg total) by mouth daily. 05/05/19   Rolly Salter, MD  atorvastatin (LIPITOR) 80 MG tablet Take by mouth. 12/05/19 12/04/20  [provider]  budesonide (PULMICORT) 0.5 MG/2ML nebulizer solution Take 2 mLs (0.5 mg total) by nebulization 2 (two) times daily. 06/04/19 06/03/20  Salena Saner, MD  busPIRone (BUSPAR) 10 MG tablet Take 1 tablet (10 mg total) by  mouth 2 (two) times daily. 01/03/20 04/02/20  Clapacs, Jackquline Denmark, MD  Cholecalciferol (VITAMIN D) 125 MCG (5000 UT) CAPS Take 1 capsule by mouth daily. 11/29/19   [provider]  famotidine (PEPCID) 20 MG tablet Take 1 tablet (20 mg total) by mouth daily. 05/05/19   Rolly Salter, MD  FLUoxetine (PROZAC) 20 MG capsule TAKE (1) CAPSULE BY MOUTH ONCE DAILY. 12/06/19   Clapacs, Jackquline Denmark, MD  folic acid (FOLVITE) 1 MG tablet Take 1 mg by mouth daily.  [provider]  furosemide (LASIX) 20 MG tablet TAKE (1) TABLET BY MOUTH DAILY AS NEEDED. 09/05/19   Parrett, Virgel Bouquet, NP  GNP CALCIUM CITRATE+D MAXIMUM 315-250 MG-UNIT TABS Take 1 tablet by mouth daily. 06/15/19   [provider]  insulin degludec (TRESIBA FLEXTOUCH) 200 UNIT/ML FlexTouch Pen Inject 22 Units into the skin in the morning and at bedtime. 07/17/19   Alford Highland, MD  insulin lispro (HUMALOG) 100 UNIT/ML injection Inject 0.08 mLs (8 Units total) into the skin 3 (three) times daily before meals. 07/17/19   Wieting, Richard, MD  ipratropium-albuterol (DUONEB) 0.5-2.5 (3) MG/3ML SOLN Take 3 mLs by nebulization 3 (three) times daily. 05/04/19   Rolly Salter, MD  levothyroxine (SYNTHROID, LEVOTHROID) 88 MCG tablet Take 88 mcg by mouth daily before breakfast.    [provider]  mupirocin ointment (BACTROBAN) 2 % Apply 1 application topically 2 (two) times daily. Intranasal - to affected area. 11/28/19   Tommie Sams, DO  oxyCODONE (OXY IR/ROXICODONE) 5 MG immediate release tablet Take 1 tablet (5 mg total) by mouth every 6 (six) hours as needed for severe pain. Must last 30 days 12/23/19 01/22/20  Edward Jolly, MD  polyethylene glycol (MIRALAX / GLYCOLAX) 17 g packet Take 17 g by mouth daily as needed for mild constipation. 05/04/19   Rolly Salter, MD  potassium chloride (KLOR-CON) 10 MEQ tablet Take 2 tablets (20 mEq total) by mouth daily for 3 days. 08/02/19 08/05/19  Concha Se, MD  potassium chloride SA  (KLOR-CON) 20 MEQ tablet TAKE 1 TABLET BY MOUTH ONCE DAILY. Patient taking differently: 10 mEq.  09/05/19   Parrett, Virgel Bouquet, NP  pramipexole (MIRAPEX) 0.125 MG tablet Take 0.25 mg by mouth at bedtime.     [provider]  pregabalin (LYRICA) 150 MG capsule Take 1 capsule (150 mg total) by mouth 3 (three) times daily. 11/19/19 05/17/20  Delano Metz, MD  QUEtiapine (SEROQUEL) 300 MG tablet TAKE ONE TABLET BY MOUTH AT BEDTIME. 01/03/20   Clapacs, Jackquline Denmark, MD  zolpidem (AMBIEN) 5 MG tablet TAKE ONE TABLET BY MOUTH AT BEDTIME. 11/20/19   Clapacs, Jackquline Denmark, MD  apixaban (ELIQUIS) 5 MG TABS tablet Two tabs po twcie a day for 6 days then one tablet twice a day after that 07/17/19 11/28/19  Alford Highland, MD      VITAL SIGNS:  Blood pressure 121/81, pulse 91, temperature 97.8 F (36.6 C), temperature source Oral, resp. rate 18, height  (1.676 m), weight (!) 145.2 kg, last menstrual period 04/20/2001, SpO2 100 %.  PHYSICAL EXAMINATION:  Physical Exam  GENERAL:  59 y.o.-year-old Caucasian patient lying in the bed with no acute distress.  EYES: Pupils equal, round, reactive to light and accommodation. No scleral icterus. Extraocular muscles intact.  HEENT: Head atraumatic, normocephalic. Oropharynx and nasopharynx clear.  NECK:  Supple, no jugular venous distention. No thyroid enlargement, no tenderness.  LUNGS: Normal breath sounds bilaterally, no wheezing, rales,rhonchi or crepitation. No use of accessory muscles of respiration.  CARDIOVASCULAR: Regular rate and rhythm, S1, S2 normal. No murmurs, rubs, or gallops.  ABDOMEN: Soft, nondistended, nontender. Bowel sounds present. No organomegaly or mass.  EXTREMITIES: No pedal edema, cyanosis, or clubbing.  NEUROLOGIC: Cranial nerves II through XII are intact. Muscle strength 5/5 in all extremities. Sensation intact. Gait not checked.  PSYCHIATRIC: The patient is alert and oriented x 3.  Normal affect and good eye contact. SKIN: Left  plantar great toe ulcer with yellowish base surrounding  mild erythema with significant left leg erythema with induration, lichenification, warmth and tenderness, extending to below her knee. LABORATORY PANEL:   CBC Recent Labs  Lab 01/09/20 2224  WBC 16.3*  HGB 10.3*  HCT 33.5*  PLT 343   ------------------------------------------------------------------------------------------------------------------  Chemistries  Recent Labs  Lab 01/09/20 2224  NA 136  K 4.1  CL 101  CO2 24  GLUCOSE 179*  BUN 29*  CREATININE 1.05*  CALCIUM 8.8*   ------------------------------------------------------------------------------------------------------------------  Cardiac Enzymes No results for input(s): TROPONINI in the last 168 hours. ------------------------------------------------------------------------------------------------------------------  RADIOLOGY:  US Venous Img Lower Unilateral Left  Result Date: 01/10/2020 CLINICAL DATA:  Left lower extremity pain and swelling EXAM: LEFT LOWER EXTREMITY VENOUS DOPPLER ULTRASOUND TECHNIQUE: Gray-scale sonography with compression, as well as color and duplex ultrasound, were performed to evaluate the deep venous system(s) from the level of the common femoral vein through the popliteal and proximal calf veins. COMPARISON:  None. FINDINGS: VENOUS Normal compressibility of the common femoral, superficial femoral, and popliteal veins, as well as the visualized calf veins. Visualized portions of profunda femoral vein and great saphenous vein unremarkable. No filling defects to suggest DVT on grayscale or color Doppler imaging. Doppler waveforms show normal direction of venous flow, normal respiratory plasticity and response to augmentation. Limited views of the contralateral common femoral vein are unremarkable. OTHER None. Limitations: none IMPRESSION: Negative. Electronically Signed   By: Helyn Numbers MD   On: 01/10/2020 04:31      IMPRESSION AND  PLAN:   1.  Left lower extremity severe nonpurulent cellulitis with subsequent mild sepsis without severe sepsis or septic shock.  This is manifested by her tachycardia and leukocytosis. -The patient will be admitted to a medical monitored bed. -She will be continued on IV antibiotic therapy with vancomycin especially given her MRSA positive infected left plantar great toe wound and given allergy to IV cefepime and meropenem we will add IV Azactam. -Pain management will be provided. -Warm compresses will be applied. -We will continue mupirocin ointment. -Wound care consult will be obtained.  2.  Anxiety/depression. -We will continue her Xanax, Prozac, BuSpar, Seroquel and Abilify.  3.  Type 2 diabetes mellitus with peripheral neuropathy. -She will be placed on supplemental coverage with NovoLog and will continue her basal coverage. -Metformin will be held off. -We will continue Lyrica  4.  Hypothyroidism. -We will continue Synthroid and check TSH level.  5.  DVT prophylaxis. -Subcutaneous Lovenox   All the records are reviewed and case discussed with ED provider. The plan of care was discussed in details with the patient (and family). I answered all questions. The patient agreed to proceed with the above mentioned plan. Further management will depend upon hospital course.   CODE STATUS: Full code  Status is: Inpatient  Remains inpatient appropriate because:Ongoing active pain requiring inpatient pain management, Ongoing diagnostic testing needed not appropriate for outpatient work up, Unsafe d/c plan, IV treatments appropriate due to intensity of illness or inability to take PO and Inpatient level of care appropriate due to severity of illness   Dispo: The patient is from: Home              Anticipated d/c is to: Home              Anticipated d/c date is: 2 days              Patient currently is not medically stable to d/c.   TOTAL TIME TAKING CARE OF THIS PATIENT: 20  minutes.    Hannah Beat M.D on 01/10/2020 at 4:46 AM  [facility]   From 7 PM-7 AM, contact night-coverage [email or web address]  CC: Primary care physician; [facility]

## 2020-01-10 NOTE — Progress Notes (Signed)
Care started prior to midnight in the emergency room and patient was admitted early this morning after midnight by Dr. Valente David and I am in current agreement with this assessment and plan.  Additional changes to the plan of care been made accordingly.  Patient is a super morbidly obese Caucasian female with a past medical history significant for but not limited to anxiety, abdominal wall hernia, rheumatoid arthritis, history of C. difficile colitis, history of chronic diastolic heart failure, history of COVID-19 disease, depression, uncontrolled diabetes mellitus type 2, hypertension, irritable bowel syndrome, major depressive disorder, history of MRSA infection, history of panic attacks, as well as other comorbidities as well as sleep apnea who presented with leg pain and noticed worsening left lower extremity swelling erythema and tenderness that extended from her left foot to her upper leg.  She has a left plantar big toe ulcer that she stated was MRSA positive and admitted to having some chills but denied any fever.  She denies any nausea or vomiting.  She presented to the emergency room and found a blood pressure 99/55 and a pulse of 116 and labs revealed leukocytosis of 16.3 and lactic acid level is 1.9.  She is given IV fentanyl for the pain and given 1 L bolus of normal saline as well as IV vancomycin she is admitted for further evaluation.  Currently she is being admitted and treated for the following but not limited to:  Left lower extremity severe nonpurulent cellulitis with subsequent mild sepsis without severe sepsis or septic shock.   -This is manifested by her tachycardia and leukocytosis. -The patient will be admitted to a medical monitored bed. -She will be continued on IV antibiotic therapy with vancomycin especially given her MRSA positive infected left plantar great toe wound and given allergy to IV cefepime and meropenem we will add IV Azactam. -WBC is improving and went from 16.3 is now  14.0 -Lactic acid level was normal at 1.9 and trended down to 1.1 and repeat is now 1.3; her procalcitonin level was 0.16 -Pain management will be provided and she has oxycodone. -Warm compresses will be applied. -Currently getting normal saline at 100 MLS per hour -We will continue mupirocin ointment. -Wound care consult will be obtained. -Have consulted Podiatry Dr. Ether Griffins for further evaluation recommendations and will be obtaining ABIs  Anxiety/Depression. -We will continue her Xanax, Prozac, BuSpar, Seroquel and Abilify.  Uncontrolled Type 2 diabetes mellitus with peripheral neuropathy. -She will be placed on supplemental coverage with NovoLog and will continue her basal coverage.  Currently getting 22 units of Lantus subcu twice daily as well as sensitive NovoLog sliding scale insulin AC -Metformin will be held off. -Hemoglobin A1c was 10.8 -We will continue Lyrica for her neuropathy -Blood sugars have been ranging from 169-183 on daily BMP/CMP's  Hypothyroidism. -We will continue Synthroid and check TSH level.  AKI -Patient had a BUN/creatinine of 32/1.35 -This is now improving after IV fluid hydration and is now 30/1.00 -Continue with IV fluid hydration as above -Avoid further nephrotoxic medications, contrast dyes, hypotension and renally adjust medications  -We will hold her Lasix for now -Continue monitor and trend renal function carefully and repeat CMP in a.m.  Microcytic Anemia -Patient's hemoglobin/hematocrit is now 9.6/30 1.5; MCV was 71.3 -Check anemia panel in the a.m. Continue to monitor for signs and symptoms of bleeding; currently no overt bleeding noted -Repeat CBC in a.m.  Super Morbid Obesity -Complicates her overall care -Estimated body mass index is 51.65 kg/m as calculated  from the following:   Height as of this encounter: 5\' 6"  (1.676 m).   Weight as of this encounter: 145.2 kg. -Weight Loss and Dietary Counseling given   We will Continue to  monitor the patient's clinical response to intervention and repeat blood work in the a.m. and follow-up on specialist recommendations as we have consulted podiatry  Status is: Inpatient  Remains inpatient appropriate because:Unsafe d/c plan, IV treatments appropriate due to intensity of illness or inability to take PO and Inpatient level of care appropriate due to severity of illness   Dispo: The patient is from: Home              Anticipated d/c is to: TBD              Anticipated d/c date is: 2 days              Patient currently is not medically stable to d/c.

## 2020-01-10 NOTE — Consult Note (Addendum)
ORTHOPAEDIC CONSULTATION  REQUESTING PHYSICIAN: Merlene Laughter, DO  Chief Complaint: Left foot ulcer.  HPI: [name] [name] is a 59 y.o. female who complains of worsening redness swelling and pain to her left foot.  Has a longstanding history of diabetes with neuropathy.  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  Has been seen by infectious disease as well as podiatry at [facility].  Presented to the ER with worsening redness and swelling to her left foot.  Elevated white blood cell count.  She states she was seen last week and an x-ray was concerning for possible foreign body in her great toe joint area.  Past Medical History:  Diagnosis Date  . Abdominal wall hernia 01/29/2013  . Anxiety   . Arthritis    Rheumatoid  . C. difficile colitis   . Chronic diastolic heart failure (HCC)   . COVID-19 03/23/2019   Diagnosed at [facility] (send-out) on 03/23/2019  . Depression   . Diabetes mellitus    states no meds or diet restrictions  at present  . Diastolic CHF (HCC)   . Esophagitis   . Fluid retention   . GERD (gastroesophageal reflux disease)   . Hiatal hernia   . Hypertension   . Hypokalemia due to loss of potassium 10/21/2015   Overview:  Associated with 3 weeks of diarrhea  And QT prolongation.  . Hypothyroidism   . IBS (irritable bowel syndrome)   . Moderate episode of recurrent major depressive disorder (HCC) 06/03/2004  . Morbid obesity (HCC)   . MRSA (methicillin resistant Staphylococcus aureus) infection 11/2017   left inner thigh abcess  . Neurogenic bladder    has pacemaker  . Neuropathy   . Obesity   . Panic attacks   . Pneumonia due to COVID-19 virus   . Rheumatoid arthritis (HCC)   . Sleep apnea    STATES SEVERE, CANT TOLERATE MASK- LAST STUDY YEARS AGO   Past Surgical History:  Procedure Laterality Date  . ABDOMINAL HYSTERECTOMY    . CHOLECYSTECTOMY    . DG GREAT TOE RIGHT FOOT  02/23/2018  . EYE SURGERY     bilateral cataract extraction with IOL   . HERNIA REPAIR     ventral hernia with strangulation  . LAPAROSCOPIC GASTRIC BANDING  03/20/07  . TEE WITHOUT CARDIOVERSION N/A 07/16/2019   Procedure: TRANSESOPHAGEAL ECHOCARDIOGRAM (TEE);  Surgeon: Debbe Odea, MD;  Location: [facility] ORS;  Service: Cardiovascular;  Laterality: N/A;  . TONSILLECTOMY    . TUBAL LIGATION     Social History   Socioeconomic History  . Marital status: Divorced    Spouse name: Not on file  . Number of children: Not on file  . Years of education: Not on file  . Highest education level: Not on file  Occupational History  . Not on file  Tobacco Use  . Smoking status: Former Smoker    Packs/day: 2.00    Years: 27.00    Pack years: 54.00    Types: Cigarettes    Quit date: 07/30/1999    Years since quitting: 20.4  . Smokeless tobacco: Never Used  Vaping Use  . Vaping Use: Never used  Substance and Sexual Activity  . Alcohol use: No  . Drug use: No  . Sexual activity: Not Currently  Other Topics Concern  . Not on file  Social History Narrative  . Not on file   Social Determinants of Health   Financial Resource Strain:   . Difficulty of Paying  Living Expenses: Not on file  Food Insecurity:   . Worried About Programme researcher, broadcasting/film/video in the Last Year: Not on file  . Ran Out of Food in the Last Year: Not on file  Transportation Needs:   . Lack of Transportation (Medical): Not on file  . Lack of Transportation (Non-Medical): Not on file  Physical Activity:   . Days of Exercise per Week: Not on file  . Minutes of Exercise per Session: Not on file  Stress:   . Feeling of Stress : Not on file  Social Connections:   . Frequency of Communication with Friends and Family: Not on file  . Frequency of Social Gatherings with Friends and Family: Not on file  . Attends Religious Services: Not on file  . Active Member of Clubs or Organizations: Not on file  . Attends Banker Meetings: Not on file  . Marital Status: Not on file   Family  History  Problem Relation Age of Onset  . Heart failure Father   . Bipolar disorder Father   . Alcohol abuse Father   . Anxiety disorder Father   . Depression Father   . Heart disease Brother   . Heart attack Brother 39       MI s/p stents placed  . Anxiety disorder Sister   . Depression Sister   . Anxiety disorder Sister   . Depression Sister   . Bipolar disorder Sister   . Alcohol abuse Sister   . Drug abuse Sister   . Heart attack Brother    Allergies  Allergen Reactions  . Cephalexin Hives  . Codeine Palpitations, Nausea Only, Nausea And Vomiting, Rash and Shortness Of Breath    "makes heart fly, she gets flushed and passes out"  . Doxycycline Rash  . Propoxyphene Rash and Shortness Of Breath    Increase heart rate  . Sulfa Antibiotics Palpitations, Nausea Only, Shortness Of Breath and Hives    "makes heart fly, she gets flushed and passes out"  . Lovenox [Enoxaparin Sodium] Hives  . Hydrocodone Nausea And Vomiting    Hear racing & breaks out into a cold sweat.  . Meropenem Rash    Erythematous, hot, pruritic rash over arms, chest, back, abdomen, and face occurred at the end of meropenem infusion on 02/22/18   Prior to Admission medications   Medication Sig Start Date End Date Taking? Authorizing Provider  ARIPiprazole (ABILIFY) 5 MG tablet TAKE 1 TABLET BY MOUTH ONCE DAILY. Patient taking differently: Take 5 mg by mouth daily.  02/22/19  Yes Clapacs, Jackquline Denmark, MD  ascorbic acid (VITAMIN C) 500 MG tablet Take 1 tablet (500 mg total) by mouth daily. 05/05/19  Yes Rolly Salter, MD  atorvastatin (LIPITOR) 80 MG tablet Take 80 mg by mouth daily.  12/05/19 12/04/20 Yes [provider]  busPIRone (BUSPAR) 10 MG tablet Take 1 tablet (10 mg total) by mouth 2 (two) times daily. 01/03/20 04/02/20 Yes Clapacs, Jackquline Denmark, MD  Cholecalciferol (VITAMIN D) 125 MCG (5000 UT) CAPS Take 1 capsule by mouth daily. 11/29/19  Yes [provider]  famotidine (PEPCID) 20 MG tablet  Take 1 tablet (20 mg total) by mouth daily. 05/05/19  Yes Rolly Salter, MD  FLUoxetine (PROZAC) 20 MG capsule TAKE (1) CAPSULE BY MOUTH ONCE DAILY. Patient taking differently: Take 20 mg by mouth daily.  12/06/19  Yes Clapacs, Jackquline Denmark, MD  folic acid (FOLVITE) 1 MG tablet Take 1 mg by mouth daily.  Yes [provider]  insulin lispro (HUMALOG) 100 UNIT/ML injection Inject 0.08 mLs (8 Units total) into the skin 3 (three) times daily before meals. 07/17/19  Yes Wieting, Richard, MD  levothyroxine (SYNTHROID, LEVOTHROID) 88 MCG tablet Take 88 mcg by mouth daily before breakfast.   Yes [provider]  linezolid (ZYVOX) 600 MG tablet Take 600 mg by mouth 2 (two) times daily. 01/09/20  Yes [provider]  metFORMIN (GLUCOPHAGE-XR) 500 MG 24 hr tablet Take 500 mg by mouth daily. 12/31/19  Yes [provider]  methotrexate 2.5 MG tablet Take 10 mg by mouth once a week.  12/31/19  Yes [provider]  potassium chloride SA (KLOR-CON) 20 MEQ tablet TAKE 1 TABLET BY MOUTH ONCE DAILY. Patient taking differently: 10 mEq.  09/05/19  Yes Parrett, Tammy S, NP  pramipexole (MIRAPEX) 0.125 MG tablet Take 0.25 mg by mouth at bedtime.    Yes [provider]  predniSONE (DELTASONE) 10 MG tablet Take 10 mg by mouth daily. 12/31/19  Yes [provider]  pregabalin (LYRICA) 150 MG capsule Take 1 capsule (150 mg total) by mouth 3 (three) times daily. 11/19/19 05/17/20 Yes Delano Metz, MD  QUEtiapine (SEROQUEL) 300 MG tablet TAKE ONE TABLET BY MOUTH AT BEDTIME. 01/03/20  Yes Clapacs, Jackquline Denmark, MD  solifenacin (VESICARE) 5 MG tablet Take 5 mg by mouth daily. 12/31/19  Yes [provider]  TRADJENTA 5 MG TABS tablet Take 5 mg by mouth daily. 12/31/19  Yes [provider]  zinc sulfate 220 (50 Zn) MG capsule Take 220 mg by mouth at bedtime.  12/31/19  Yes [provider]  zolpidem (AMBIEN) 5 MG tablet TAKE ONE TABLET BY MOUTH AT BEDTIME.  11/20/19  Yes Clapacs, Jackquline Denmark, MD  ALPRAZolam Prudy Feeler) 0.5 MG tablet Take 1 tablet (0.5 mg total) by mouth 2 (two) times daily as needed for anxiety. Patient not taking: Reported on 01/10/2020 07/17/19   Alford Highland, MD  budesonide (PULMICORT) 0.5 MG/2ML nebulizer solution Take 2 mLs (0.5 mg total) by nebulization 2 (two) times daily. 06/04/19 06/03/20  Salena Saner, MD  furosemide (LASIX) 20 MG tablet TAKE (1) TABLET BY MOUTH DAILY AS NEEDED. Patient not taking: Reported on 01/10/2020 09/05/19   Parrett, Virgel Bouquet, NP  insulin degludec (TRESIBA FLEXTOUCH) 200 UNIT/ML FlexTouch Pen Inject 22 Units into the skin in the morning and at bedtime. Patient not taking: Reported on 01/10/2020 07/17/19   Alford Highland, MD  ipratropium-albuterol (DUONEB) 0.5-2.5 (3) MG/3ML SOLN Take 3 mLs by nebulization 3 (three) times daily. 05/04/19   Rolly Salter, MD  mupirocin ointment (BACTROBAN) 2 % Apply 1 application topically 2 (two) times daily. Intranasal - to affected area. 11/28/19   Tommie Sams, DO  oxyCODONE (OXY IR/ROXICODONE) 5 MG immediate release tablet Take 1 tablet (5 mg total) by mouth every 6 (six) hours as needed for severe pain. Must last 30 days 12/23/19 01/22/20  Edward Jolly, MD  polyethylene glycol (MIRALAX / GLYCOLAX) 17 g packet Take 17 g by mouth daily as needed for mild constipation. Patient not taking: Reported on 01/10/2020 05/04/19   Rolly Salter, MD  potassium chloride (KLOR-CON) 10 MEQ tablet Take 2 tablets (20 mEq total) by mouth daily for 3 days. Patient not taking: Reported on 01/10/2020 08/02/19 08/05/19  Concha Se, MD  apixaban Everlene Balls) 5 MG TABS tablet Two tabs po twcie a day for 6 days then one tablet twice a day after that 07/17/19 11/28/19  Alford Highland, MD   US Venous Img Lower Unilateral Left  Result Date: 01/10/2020 CLINICAL DATA:  Left lower extremity pain and swelling EXAM: LEFT LOWER EXTREMITY VENOUS DOPPLER ULTRASOUND TECHNIQUE: Gray-scale sonography with  compression, as well as color and duplex ultrasound, were performed to evaluate the deep venous system(s) from the level of the common femoral vein through the popliteal and proximal calf veins. COMPARISON:  None. FINDINGS: VENOUS Normal compressibility of the common femoral, superficial femoral, and popliteal veins, as well as the visualized calf veins. Visualized portions of profunda femoral vein and great saphenous vein unremarkable. No filling defects to suggest DVT on grayscale or color Doppler imaging. Doppler waveforms show normal direction of venous flow, normal respiratory plasticity and response to augmentation. Limited views of the contralateral common femoral vein are unremarkable. OTHER None. Limitations: none IMPRESSION: Negative. Electronically Signed   By: Helyn Numbers MD   On: 01/10/2020 04:31   US ARTERIAL ABI (SCREENING LOWER EXTREMITY)  Result Date: 01/10/2020 CLINICAL DATA:  Previous smoker, obesity, diabetes, left leg pain and ulceration EXAM: NONINVASIVE PHYSIOLOGIC VASCULAR STUDY OF BILATERAL LOWER EXTREMITIES TECHNIQUE: Evaluation of both lower extremities were performed at rest, including calculation of ankle-brachial indices with single level Doppler, pressure and pulse volume recording. COMPARISON:  None. FINDINGS: Right ABI:  1.20 Left ABI:  0.92 Right Lower Extremity:  Normal arterial waveforms at the ankle. Left Lower Extremity: Monophasic arterial waveforms at the left ankle 0.9-0.99 Borderline PAD IMPRESSION: Normal right resting ABI Mildly decreased left resting ABI, borderline peripheral arterial disease range Electronically Signed   By: Judie Petit.  Shick M.D.   On: 01/10/2020 11:17    Positive ROS: All other systems have been reviewed and were otherwise negative with the exception of those mentioned in the HPI and as above.  12 point ROS was performed.  Physical Exam: General: Alert and oriented.  No apparent distress.  Vascular:  Left foot:Dorsalis Pedis:   present Posterior Tibial:  diminished  Right foot: Dorsalis Pedis:  present Posterior Tibial:  diminished diminished pulses likely secondary to edema.  Neuro:absent protective sensation.  She did have some gross sensation and some mild discomfort although I was able to probe the ulceration quite easily without severe pain.  Derm: 1 cm ulceration to the plantar aspect of the left first MTPJ.  Overlying fibrotic tissue.  Probes down to subcutaneous tissue.  Did not directly probe to bone.  No purulence.  Fairly severe cellulitis to this left leg at this time.       Ortho/MS: Patient is status post right great toe and second toe amputation.  Noted bilateral lower extremity edema. Left-sided edema slightly worse than the right.  Assessment: Diabetic neuropathic ulceration left foot. Cellulitis left leg  Plan: We will order x-rays of the left foot at this point.  She stated x-rays at outside facility were concerning for possible metallic foreign body in the region.  I discussed performing an MRI but she states she has a bladder stimulator and is not able to undergo MRI.  If x-rays are benign will possibly order CT scan of the foot.  Reviewing her chart from [facility] shows she has difficulty with antibiotics though she was placed on linezolid and Cipro apparently yesterday was seen in the clinic at [facility].  Patient with a history of MRSA from the wound culture per [facility] infectious disease note from yesterday.  We will await x-ray results and follow-up at that time.     Irean Hong, DPM Cell [phone]  01/10/2020 12:05 PM    1:52 Addendum: Xray negative for gas or erosion. FB (mettalic) noted but under 2nd and 3rd mtpj. Will order CT of foot to look for osteomyelitis (pt with bladder stimulator and not able to have MRI.)

## 2020-01-10 NOTE — Progress Notes (Signed)
Pharmacy Antibiotic Note  [name] [name] is a 59 y.o. female admitted on 01/10/2020 with cellulitis.  Pharmacy has been consulted for Vancomycin dosing.  Plan: Vancomycin 1 gm IV X 1 given in ED on 9/23 @ 0400. Vancomycin 1500 gm IV X 1 ordered to make total loading dose of 2500 mg. Vancomycin 1250 mg IV Q12H ordered to start on 9/23 @ 1600.   Height: 5\' 6"  (167.6 cm) Weight: (!) 145.2 kg (320 lb) IBW/kg (Calculated) : 59.3  Temp (24hrs), Avg:98.9 F (37.2 C), Min:97.8 F (36.6 C), Max:100 F (37.8 C)  Recent Labs  Lab 01/09/20 2224 01/10/20 0214 01/10/20 0354  WBC 16.3*  --   --   CREATININE 1.05*  --   --   LATICACIDVEN  --  1.9 1.3    Estimated Creatinine Clearance: 86.4 mL/min (A) (by C-G formula based on SCr of 1.05 mg/dL (H)).    Allergies  Allergen Reactions  . Cephalexin Hives  . Codeine Palpitations, Nausea Only, Nausea And Vomiting, Rash and Shortness Of Breath    "makes heart fly, she gets flushed and passes out"  . Doxycycline Rash  . Propoxyphene Rash and Shortness Of Breath    Increase heart rate  . Sulfa Antibiotics Palpitations, Nausea Only, Shortness Of Breath and Hives    "makes heart fly, she gets flushed and passes out"  . Lovenox [Enoxaparin Sodium] Hives  . Hydrocodone Nausea And Vomiting    Hear racing & breaks out into a cold sweat.  . Meropenem Rash    Erythematous, hot, pruritic rash over arms, chest, back, abdomen, and face occurred at the end of meropenem infusion on 02/22/18    Antimicrobials this admission:   >>    >>   Dose adjustments this admission:   Microbiology results:  BCx:   UCx:    Sputum:    MRSA PCR:   Thank you for allowing pharmacy to be a part of this patient's care.  Kajuan Guyton D 01/10/2020 4:43 AM

## 2020-01-11 ENCOUNTER — Inpatient Hospital Stay: Payer: Medicaid Other | Admitting: Anesthesiology

## 2020-01-11 ENCOUNTER — Encounter
Admission: EM | Disposition: A | Discharge status: Home Health | Payer: Self-pay | Source: Home / Self Care | Attending: Internal Medicine

## 2020-01-11 ENCOUNTER — Encounter: Payer: Self-pay | Admitting: Family Medicine

## 2020-01-11 DIAGNOSIS — L02612 Cutaneous abscess of left foot: Secondary | ICD-10-CM

## 2020-01-11 DIAGNOSIS — M795 Residual foreign body in soft tissue: Secondary | ICD-10-CM

## 2020-01-11 DIAGNOSIS — E1165 Type 2 diabetes mellitus with hyperglycemia: Secondary | ICD-10-CM

## 2020-01-11 DIAGNOSIS — M869 Osteomyelitis, unspecified: Secondary | ICD-10-CM

## 2020-01-11 HISTORY — PX: IRRIGATION AND DEBRIDEMENT FOOT: SHX6602

## 2020-01-11 LAB — CBC WITH DIFFERENTIAL/PLATELET
Abs Immature Granulocytes: 0.13 10*3/uL — ABNORMAL HIGH (ref 0.00–0.07)
Basophils Absolute: 0.1 10*3/uL (ref 0.0–0.1)
Basophils Relative: 1 %
Eosinophils Absolute: 0 10*3/uL (ref 0.0–0.5)
Eosinophils Relative: 0 %
HCT: 28.8 % — ABNORMAL LOW (ref 36.0–46.0)
Hemoglobin: 8.3 g/dL — ABNORMAL LOW (ref 12.0–15.0)
Immature Granulocytes: 1 %
Lymphocytes Relative: 13 %
Lymphs Abs: 1.4 10*3/uL (ref 0.7–4.0)
MCH: 21.1 pg — ABNORMAL LOW (ref 26.0–34.0)
MCHC: 28.8 g/dL — ABNORMAL LOW (ref 30.0–36.0)
MCV: 73.1 fL — ABNORMAL LOW (ref 80.0–100.0)
Monocytes Absolute: 0.8 10*3/uL (ref 0.1–1.0)
Monocytes Relative: 7 %
Neutro Abs: 9 10*3/uL — ABNORMAL HIGH (ref 1.7–7.7)
Neutrophils Relative %: 78 %
Platelets: 288 10*3/uL (ref 150–400)
RBC: 3.94 MIL/uL (ref 3.87–5.11)
RDW: 19.4 % — ABNORMAL HIGH (ref 11.5–15.5)
WBC: 11.4 10*3/uL — ABNORMAL HIGH (ref 4.0–10.5)
nRBC: 0 % (ref 0.0–0.2)

## 2020-01-11 LAB — COMPREHENSIVE METABOLIC PANEL
ALT: 16 U/L (ref 0–44)
AST: 16 U/L (ref 15–41)
Albumin: 2.7 g/dL — ABNORMAL LOW (ref 3.5–5.0)
Alkaline Phosphatase: 63 U/L (ref 38–126)
Anion gap: 7 (ref 5–15)
BUN: 22 mg/dL — ABNORMAL HIGH (ref 6–20)
CO2: 24 mmol/L (ref 22–32)
Calcium: 8.3 mg/dL — ABNORMAL LOW (ref 8.9–10.3)
Chloride: 107 mmol/L (ref 98–111)
Creatinine, Ser: 0.79 mg/dL (ref 0.44–1.00)
GFR calc Af Amer: >60 mL/min (ref >60)
GFR calc non Af Amer: >60 mL/min (ref >60)
Glucose, Bld: 146 mg/dL — ABNORMAL HIGH (ref 70–99)
Potassium: 3.7 mmol/L (ref 3.5–5.1)
Sodium: 138 mmol/L (ref 135–145)
Total Bilirubin: 0.6 mg/dL (ref 0.3–1.2)
Total Protein: 6.7 g/dL (ref 6.5–8.1)

## 2020-01-11 LAB — GLUCOSE, CAPILLARY
Glucose-Capillary: 122 mg/dL — ABNORMAL HIGH (ref 70–99)
Glucose-Capillary: 148 mg/dL — ABNORMAL HIGH (ref 70–99)
Glucose-Capillary: 159 mg/dL — ABNORMAL HIGH (ref 70–99)
Glucose-Capillary: 133 mg/dL — ABNORMAL HIGH (ref 70–99)
Glucose-Capillary: 127 mg/dL — ABNORMAL HIGH (ref 70–99)

## 2020-01-11 LAB — FOLATE: Folate: 38 ng/mL (ref >5.9)

## 2020-01-11 LAB — FERRITIN: Ferritin: 72 ng/mL (ref 11–307)

## 2020-01-11 LAB — RETICULOCYTES
Immature Retic Fract: 33.6 % — ABNORMAL HIGH (ref 2.3–15.9)
RBC.: 3.97 MIL/uL (ref 3.87–5.11)
Retic Count, Absolute: 86.5 10*3/uL (ref 19.0–186.0)
Retic Ct Pct: 2.2 % (ref 0.4–3.1)

## 2020-01-11 LAB — PHOSPHORUS: Phosphorus: 3 mg/dL (ref 2.5–4.6)

## 2020-01-11 LAB — MAGNESIUM: Magnesium: 1.8 mg/dL (ref 1.7–2.4)

## 2020-01-11 LAB — IRON AND TIBC
Iron: 17 ug/dL — ABNORMAL LOW (ref 28–170)
Saturation Ratios: 6 % — ABNORMAL LOW (ref 10.4–31.8)
TIBC: 267 ug/dL (ref 250–450)
UIBC: 250 ug/dL

## 2020-01-11 LAB — VITAMIN B12: Vitamin B-12: 211 pg/mL (ref 180–914)

## 2020-01-11 SURGERY — IRRIGATION AND DEBRIDEMENT FOOT
Anesthesia: General | Laterality: Left

## 2020-01-11 MED ORDER — FENTANYL CITRATE (PF) 100 MCG/2ML IJ SOLN: 25.0000 ug | INTRAMUSCULAR | Status: DC | PRN

## 2020-01-11 MED ORDER — PROPOFOL 10 MG/ML IV BOLUS
INTRAVENOUS | Status: DC | PRN
  Administered 2020-01-11: 180 mg via INTRAVENOUS

## 2020-01-11 MED ORDER — ACETAMINOPHEN 10 MG/ML IV SOLN
INTRAVENOUS | Status: DC | PRN
  Administered 2020-01-11: 1000 mg via INTRAVENOUS

## 2020-01-11 MED ORDER — VASOPRESSIN 20 UNIT/ML IV SOLN
INTRAVENOUS | Status: DC | PRN
  Administered 2020-01-11 (×4): 2 [IU] via INTRAVENOUS
  Administered 2020-01-11: 1 [IU] via INTRAVENOUS

## 2020-01-11 MED ORDER — BUPIVACAINE HCL (PF) 0.25 % IJ SOLN
INTRAMUSCULAR | Status: DC | PRN
  Administered 2020-01-11: 20 mL

## 2020-01-11 MED ORDER — KETAMINE HCL 50 MG/ML IJ SOLN
INTRAMUSCULAR | Status: AC
  Filled 2020-01-11: qty 10

## 2020-01-11 MED ORDER — NOREPINEPHRINE BITARTRATE 1 MG/ML IV SOLN
INTRAVENOUS | Status: AC
  Filled 2020-01-11: qty 4

## 2020-01-11 MED ORDER — CHLORHEXIDINE GLUCONATE 4 % EX LIQD
60.0000 mL | Freq: Once | CUTANEOUS | Status: AC
  Administered 2020-01-11: 4 via TOPICAL

## 2020-01-11 MED ORDER — POVIDONE-IODINE 10 % EX SWAB: 2.0000 "application " | Freq: Once | CUTANEOUS | Status: DC

## 2020-01-11 MED ORDER — LIDOCAINE HCL (CARDIAC) PF 100 MG/5ML IV SOSY
PREFILLED_SYRINGE | INTRAVENOUS | Status: DC | PRN
  Administered 2020-01-11: 100 mg via INTRAVENOUS

## 2020-01-11 MED ORDER — PHENYLEPHRINE HCL (PRESSORS) 10 MG/ML IV SOLN
INTRAVENOUS | Status: DC | PRN
  Administered 2020-01-11 (×2): 200 ug via INTRAVENOUS

## 2020-01-11 MED ORDER — ACETAMINOPHEN 10 MG/ML IV SOLN
INTRAVENOUS | Status: AC
  Filled 2020-01-11: qty 100

## 2020-01-11 MED ORDER — ONDANSETRON HCL 4 MG/2ML IJ SOLN: 4.0000 mg | Freq: Once | INTRAMUSCULAR | Status: DC | PRN

## 2020-01-11 MED ORDER — VANCOMYCIN HCL 1500 MG/300ML IV SOLN
1500.0000 mg | Freq: Two times a day (BID) | INTRAVENOUS | Status: DC
  Administered 2020-01-12 – 2020-01-15 (×7): 1500 mg via INTRAVENOUS
  Filled 2020-01-11 (×11): qty 300

## 2020-01-11 MED ORDER — FENTANYL CITRATE (PF) 100 MCG/2ML IJ SOLN
INTRAMUSCULAR | Status: DC | PRN
Start: 2020-01-11 — End: 2020-01-11
  Administered 2020-01-11 (×2): 50 ug via INTRAVENOUS

## 2020-01-11 MED ORDER — KETAMINE HCL 10 MG/ML IJ SOLN
INTRAMUSCULAR | Status: DC | PRN
  Administered 2020-01-11: 25 mg via INTRAVENOUS

## 2020-01-11 MED ORDER — NOREPINEPHRINE BITARTRATE 1 MG/ML IV SOLN
INTRAVENOUS | Status: DC | PRN
  Administered 2020-01-11: 1 mL via INTRAVENOUS

## 2020-01-11 MED ORDER — LACTATED RINGERS IV SOLN: INTRAVENOUS | Status: DC | PRN

## 2020-01-11 MED ORDER — FENTANYL CITRATE (PF) 100 MCG/2ML IJ SOLN
INTRAMUSCULAR | Status: AC
  Filled 2020-01-11: qty 2

## 2020-01-11 MED ORDER — SUCCINYLCHOLINE CHLORIDE 20 MG/ML IJ SOLN
INTRAMUSCULAR | Status: DC | PRN
  Administered 2020-01-11: 140 mg via INTRAVENOUS

## 2020-01-11 MED ORDER — ACETAMINOPHEN 10 MG/ML IV SOLN: 1000.0000 mg | Freq: Once | INTRAVENOUS | Status: DC | PRN

## 2020-01-11 MED ORDER — MAGNESIUM SULFATE 2 GM/50ML IV SOLN
2.0000 g | Freq: Once | INTRAVENOUS | Status: AC
  Administered 2020-01-11: 2 g via INTRAVENOUS
  Filled 2020-01-11: qty 50

## 2020-01-11 MED ORDER — SODIUM CHLORIDE 0.9 % IV SOLN: INTRAVENOUS | Status: AC

## 2020-01-11 MED ORDER — PROPOFOL 10 MG/ML IV BOLUS
INTRAVENOUS | Status: AC
  Filled 2020-01-11: qty 20

## 2020-01-11 MED ORDER — PHENYLEPHRINE HCL-NACL 10-0.9 MG/250ML-% IV SOLN
INTRAVENOUS | Status: DC | PRN
  Administered 2020-01-11: 50 ug/min via INTRAVENOUS

## 2020-01-11 SURGICAL SUPPLY — 62 items
BLADE OSC/SAGITTAL MD 5.5X18 (BLADE) IMPLANT
BLADE OSCILLATING/SAGITTAL (BLADE)
BLADE SW THK.38XMED LNG THN (BLADE) IMPLANT
BNDG COHESIVE 4X5 TAN STRL (GAUZE/BANDAGES/DRESSINGS) ×2 IMPLANT
BNDG COHESIVE 6X5 TAN STRL LF (GAUZE/BANDAGES/DRESSINGS) ×2 IMPLANT
BNDG CONFORM 2 STRL LF (GAUZE/BANDAGES/DRESSINGS) ×1 IMPLANT
BNDG CONFORM 3 STRL LF (GAUZE/BANDAGES/DRESSINGS) ×2 IMPLANT
BNDG ELASTIC 4X5.8 VLCR STR LF (GAUZE/BANDAGES/DRESSINGS) ×2 IMPLANT
BNDG ESMARK 4X12 TAN STRL LF (GAUZE/BANDAGES/DRESSINGS) ×2 IMPLANT
BNDG GAUZE 4.5X4.1 6PLY STRL (MISCELLANEOUS) ×2 IMPLANT
CANISTER SUCT 1200ML W/VALVE (MISCELLANEOUS) ×2 IMPLANT
CANISTER SUCT 3000ML PPV (MISCELLANEOUS) ×2 IMPLANT
COVER WAND RF STERILE (DRAPES) ×2 IMPLANT
CUFF TOURN SGL QUICK 12 (TOURNIQUET CUFF) IMPLANT
CUFF TOURN SGL QUICK 18X4 (TOURNIQUET CUFF) ×1 IMPLANT
DRAPE FLUOR MINI C-ARM 54X84 (DRAPES) IMPLANT
DRAPE XRAY CASSETTE 23X24 (DRAPES) IMPLANT
DRSG MEPILEX FLEX 3X3 (GAUZE/BANDAGES/DRESSINGS) IMPLANT
DURAPREP 26ML APPLICATOR (WOUND CARE) ×2 IMPLANT
ELECT REM PT RETURN 9FT ADLT (ELECTROSURGICAL) ×2
ELECTRODE REM PT RTRN 9FT ADLT (ELECTROSURGICAL) ×1 IMPLANT
GAUZE PACKING 1/4 X5 YD (GAUZE/BANDAGES/DRESSINGS) ×2 IMPLANT
GAUZE PACKING IODOFORM 1X5 (PACKING) ×2 IMPLANT
GAUZE SPONGE 4X4 12PLY STRL (GAUZE/BANDAGES/DRESSINGS) ×2 IMPLANT
GAUZE XEROFORM 1X8 LF (GAUZE/BANDAGES/DRESSINGS) ×2 IMPLANT
GLOVE BIO SURGEON STRL SZ7.5 (GLOVE) ×2 IMPLANT
GLOVE INDICATOR 8.0 STRL GRN (GLOVE) ×2 IMPLANT
GOWN STRL REUS W/ TWL XL LVL3 (GOWN DISPOSABLE) ×2 IMPLANT
GOWN STRL REUS W/TWL MED LVL3 (GOWN DISPOSABLE) ×4 IMPLANT
GOWN STRL REUS W/TWL XL LVL3 (GOWN DISPOSABLE) ×4
HANDPIECE VERSAJET DEBRIDEMENT (MISCELLANEOUS) ×1 IMPLANT
IV NS 1000ML (IV SOLUTION) ×2
IV NS 1000ML BAXH (IV SOLUTION) ×1 IMPLANT
KIT TURNOVER KIT A (KITS) ×2 IMPLANT
LABEL OR SOLS (LABEL) ×2 IMPLANT
NDL FILTER BLUNT 18X1 1/2 (NEEDLE) ×1 IMPLANT
NDL HYPO 25X1 1.5 SAFETY (NEEDLE) ×1 IMPLANT
NEEDLE FILTER BLUNT 18X 1/2SAF (NEEDLE) ×1
NEEDLE FILTER BLUNT 18X1 1/2 (NEEDLE) ×1 IMPLANT
NEEDLE HYPO 25X1 1.5 SAFETY (NEEDLE) ×2 IMPLANT
NS IRRIG 500ML POUR BTL (IV SOLUTION) ×2 IMPLANT
PACK EXTREMITY (MISCELLANEOUS) ×2 IMPLANT
PAD ABD DERMACEA PRESS 5X9 (GAUZE/BANDAGES/DRESSINGS) ×2 IMPLANT
PULSAVAC PLUS IRRIG FAN TIP (DISPOSABLE)
RASP SM TEAR CROSS CUT (RASP) IMPLANT
SHIELD FULL FACE ANTIFOG 7M (MISCELLANEOUS) ×2 IMPLANT
SOL .9 NS 3000ML IRR  AL (IV SOLUTION) ×2
SOL .9 NS 3000ML IRR AL (IV SOLUTION) ×1
SOL .9 NS 3000ML IRR UROMATIC (IV SOLUTION) ×1 IMPLANT
SOL PREP PVP 2OZ (MISCELLANEOUS) ×2
SOLUTION PREP PVP 2OZ (MISCELLANEOUS) ×1 IMPLANT
STOCKINETTE IMPERVIOUS 9X36 MD (GAUZE/BANDAGES/DRESSINGS) ×2 IMPLANT
SUT ETHILON 2 0 FS 18 (SUTURE) ×4 IMPLANT
SUT ETHILON 4-0 (SUTURE) ×2
SUT ETHILON 4-0 FS2 18XMFL BLK (SUTURE) ×1
SUT VIC AB 3-0 SH 27 (SUTURE)
SUT VIC AB 3-0 SH 27X BRD (SUTURE) ×1 IMPLANT
SUT VIC AB 4-0 FS2 27 (SUTURE) ×1 IMPLANT
SUTURE ETHLN 4-0 FS2 18XMF BLK (SUTURE) ×1 IMPLANT
SWAB CULTURE AMIES ANAERIB BLU (MISCELLANEOUS) IMPLANT
SYR 10ML LL (SYRINGE) ×4 IMPLANT
TIP FAN IRRIG PULSAVAC PLUS (DISPOSABLE) ×1 IMPLANT

## 2020-01-11 NOTE — Plan of Care (Signed)

## 2020-01-11 NOTE — Progress Notes (Signed)
Pharmacy Antibiotic Note  [name] [name] is a 59 y.o. female admitted on 01/10/2020 with cellulitis. 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 Pharmacy has been consulted for Vancomycin dosing.  Plan: Adjust dose to Vancomycin 1500mg  IV every 12 hours.  Continue to monitor renal function and adjust dose as needed.   Plan for I & D and possible bone excision and biopsy today. F/U cultures.    Height: 5\' 6"  (167.6 cm) Weight: (!) 145.2 kg (320 lb) IBW/kg (Calculated) : 59.3  Temp (24hrs), Avg:98.8 F (37.1 C), Min:97.9 F (36.6 C), Max:99.4 F (37.4 C)  Recent Labs  Lab 01/09/20 2224 01/10/20 0214 01/10/20 0354 01/10/20 0541 01/10/20 0832 01/10/20 1023 01/11/20 0420  WBC 16.3*  --   --  16.3*  --  14.0* 11.4*  CREATININE 1.05*  --   --  1.35*  --  1.00 0.79  LATICACIDVEN  --  1.9 1.3 1.1 1.3  --   --     Estimated Creatinine Clearance: 113.4 mL/min (by C-G formula based on SCr of 0.79 mg/dL).    Allergies  Allergen Reactions  . Cephalexin Hives  . Codeine Palpitations, Nausea Only, Nausea And Vomiting, Rash and Shortness Of Breath    "makes heart fly, she gets flushed and passes out"  . Doxycycline Rash  . Propoxyphene Rash and Shortness Of Breath    Increase heart rate  . Sulfa Antibiotics Palpitations, Nausea Only, Shortness Of Breath and Hives    "makes heart fly, she gets flushed and passes out"  . Lovenox [Enoxaparin Sodium] Hives  . Hydrocodone Nausea And Vomiting    Hear racing & breaks out into a cold sweat.  . Meropenem Rash    Erythematous, hot, pruritic rash over arms, chest, back, abdomen, and face occurred at the end of meropenem infusion on 02/22/18    Antimicrobials this admission: Vancomycin 9/23  >>  aztreonam 9/23 >> 9/23 Ceftriaxone 9/23>>  Microbiology results:  BCx: NG TD   Thank you for allowing pharmacy to be a part of this patient's care.  10/23, PharmD, BCPS Clinical  Pharmacist 01/11/2020 9:34 AM

## 2020-01-11 NOTE — Op Note (Signed)
Operative note   Surgeon:Tirrell Buchberger Armed forces logistics/support/administrative officer: None    Preop diagnosis: Abscess with possible osteomyelitis left great toe joint    Postop diagnosis: Abscess plantar left foot and webspace 2.  Osteomyelitis left fibular sesamoid    Procedure: 1.  I&D abscess left webspace 2.  Excision fibular sesamoid left foot    EBL: Minimal    Anesthesia:local and general    Hemostasis: Calf tourniquet inflated to 250 mmHg for approximately 15 minutes    Specimen: Deep wound culture and sesamoid for pathology    Complications: None    Operative indications:[name] [name] is an 59 y.o. that presents today for surgical intervention.  The risks/benefits/alternatives/complications have been discussed and consent has been given.    Procedure:  Patient was brought into the OR and placed on the operating table in thesupine position. After anesthesia was obtained theleft lower extremity was prepped and draped in usual sterile fashion.  Attention was directed to the plantar aspect of the left foot where a ulceration was noted underneath the first MTPJ.  A longitudinal incision was made from the first webspace to the plantar midfoot region of the first metatarsal.  Full-thickness incision was carried down deep.  The intermetatarsal space and first web space was then entered.  There was noted to be a serous and serosanguineous drainage with a scant purulent drainage from the area.  A deep wound culture was then performed.  The wound was flushed with copious amounts of irrigation.  Further dissection then identified the long flexor tendon to the great toe.  This was retracted medially.  The fibular sesamoid was noted and this was then excised and sent for pathological examination.  Grossly there was a defect to the plantar surface of the sesamoid.  Further inspection did not reveal any abscessed areas.  Wounds were all flushed with copious amounts of irrigation.  The skin was then reanastomosed with a 2-0  nylon.  The central ulceration was debrided full-thickness with a versa jet down to the level of capsule.  Ulceration measured 1 cm in diameter with a depth of approximately 1-1/2 cm.  All bleeders were Bovie cauterized.  The wound was then packed with Nu Gauze plain packing strips.  A bulky sterile dressing was applied.  She will be readmitted back to the floor for further evaluation.    Patient tolerated the procedure and anesthesia well.  Was transported from the OR to the PACU with all vital signs stable and vascular status intact. To be discharged per routine protocol.  Will follow up in approximately 1 week in the outpatient clinic.

## 2020-01-11 NOTE — Progress Notes (Signed)
PROGRESS NOTE    [name] [name]  ZHY:[id number] DOB: March 05, 1961 DOA: 01/10/2020 PCP: [facility]   Brief Narrative:  The patient is a super morbidly obese Caucasian female with a past medical history significant for but not limited to anxiety, abdominal wall hernia, rheumatoid arthritis, history of C. difficile colitis, history of chronic diastolic heart failure, history of COVID-19 disease, depression, uncontrolled diabetes mellitus type 2, hypertension, irritable bowel syndrome, major depressive disorder, history of MRSA infection, history of panic attacks, as well as other comorbidities as well as sleep apnea who presented with leg pain and noticed worsening left lower extremity swelling erythema and tenderness that extended from her left foot to her upper leg.  She has a left plantar big toe ulcer that she stated was MRSA positive and admitted to having some chills but denied any fever.  She denies any nausea or vomiting.  She presented to the emergency room and found a blood pressure 99/55 and a pulse of 116 and labs revealed leukocytosis of 16.3 and lactic acid level is 1.9.  She was given IV fentanyl for the pain and given 1 L bolus of normal saline as well as IV vancomycin she is admitted for further evaluation.  **Interim History The patient was also given IV Azactam and this was changed to IV ceftriaxone.  Podiatry was consulted for further evaluation recommendations and they recommend x-rays of the foot.  It showed diffuse soft tissue swelling and soft tissue ulceration noted at the distal plantar aspect of the foot as well as an overlying thin linear metallic density noted at the level of the second and third metatarsals along the plantar aspect of the foot suggesting a foreign body.  Because the patient was unable to undergo an MRI of the foot given her bladder stimulator she had a foot CT scan done which showed soft tissue ulceration at the plantar aspect of the left foot underlying  the first metatarsal head as well as erosive changes along the lateral aspect of the first metatarsal head and within the lateral hallux sesamoid were suggestive of osteomyelitis which was acute on chronic.  Patient also had a small ill-defined fluid collection adjacent to the lateral aspect of the first metatarsal head within the first intermetatarsal space measuring approximately 1.2 cm concerning for an abscess.  Patient also had small first MTP joint effusion which may reflected septic arthritis and a 7 mm linear radiopaque foreign body at the plantar soft tissues of the forefoot in line with the third MTP joint.  Podiatric surgery is planning on taking the patient to the OR today for an I&D and possible bone excision and biopsy and this is can be done 01/11/2020.   Assessment & Plan:   Active Problems:   Lower extremity cellulitis  Left lower extremity severe nonpurulent cellulitis with subsequent sepsis without severe sepsis or septic shock.  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 -She wa admitted to a medical monitored bed. -She will be continued on IV antibiotic therapy with vancomycin especially given her MRSA positive infected left plantar great toe wound andgiven allergy toIV cefepimeand meropenem she was given IV Azactam however after further review she was tolerant of IV ceftriaxone and will de-escalate and continue IV vancomycin and continue IV ceftriaxone for now  -Foot x-rays at outside facility but then had  them repeated here -Foot X-Ray showed "Diffuse severe soft tissue swelling. Soft tissue ulceration noted along the distal plantar aspect of the left foot. 2. Overlying thin linear metallic density noted at the level the distal second and third metatarsals along the plantar aspect of the foot  suggesting a foreign body. 3.  No acute bony abnormality.  No bony erosions" -Because of this podiatry recommended obtaining MRI but is unable to be done given her bladder stimulator so CT scan of the foot was done without contrast -CT scan of the left foot without contrast showed "Soft tissue ulceration at the plantar aspect of the left foot underlying the first metatarsal head. Erosive changes along the lateral aspect of the first metatarsal head and within the lateral hallux sesamoid, which have partially corticated margins. Findings suggest sequela of osteomyelitis, likely acute on chronic. 2. Small ill-defined fluid collection adjacent to the lateral aspect of the first metatarsal head within the first intermetatarsal space measuring approximately 1.2 cm, concerning for an abscess. 3. Small first MTP joint effusion, which may reflect septic arthritis. 4. 7 mm linear radiopaque foreign body within the plantar soft tissues of the forefoot underlying the third MTP joint."  -WBC is improving and went from 16.3 -> 14.0 -> 11.4 -Lactic acid level was normal at 1.9 and trended down to 1.1 and repeat is now 1.3; her procalcitonin level was 0.16 -Pain management will be provided and she has Oxycodone IR 5 mg po q6hprn.  Patient also has pregabalin 150 g p.o. 3 times daily -Warm compresses will be applied. -Currently getting normal saline at 100 MLS per hour and will reduce to 75 MLS per hour -We will continue mupirocin ointment. -Wound care consult will be obtained. -Have consulted Podiatry Dr. Ether Griffins for further evaluation recommendations and will be obtaining ABIs  Anxiety/Depression. -We will continue her alprazolam 0.5 mg p.o. twice daily as needed anxiety, fluoxetine 20 mg p.o. daily, buspirone 10 mg p.o. twice daily, quetiapine 300 g p.o. daily nightly, pregabalin 150 mg p.o. 3 times daily, and aripiprazole 5 mg p.o. daily  Uncontrolled Type 2 diabetes mellitus with peripheral neuropathy. -She  will be placed on supplemental coverage with NovoLog and will continue her basal coverage.  Currently getting 22 units of Lantus subcu twice daily as well as sensitive NovoLog sliding scale insulin AC -Metformin will be held off. -Hemoglobin A1c was 10.8 -We will continue Lyrica for her neuropathy -Blood sugars have been ranging from 146-183 on daily BMP/CMP's: CBGs ranging from 122-192  Hypothyroidism. -We will continue levothyroxine 88 mcg p.o. daily and check TSH level.  Chronic Diastolic CHF -Currently not in exacerbation -Her Lasix is being held given her sepsis physiology and AKI and will reduce to 75 MLS per hour for another 12 more hours -Strict I's and O's and daily weights  -Continue to monitor extremely carefully and continue to monitor for signs and symptoms of volume overload  GERD -Continue with Famotidine 20 g p.o. daily  Hyperlipidemia -Continue with Atorvastatin 80 mg po Daily  AKI -Patient had a BUN/creatinine of 32/1.35 -This is now improving after IV fluid hydration and is now 22/0.79 -Continue with IV fluid hydration as above at 100 MLS per hour and will reduce to 75 MLS per hour -Avoid further nephrotoxic medications, contrast dyes, hypotension and renally adjust medications  -We will hold her Lasix for now -Continue monitor and trend renal function carefully and repeat CMP in a.m.  Microcytic Anemia -Patient's hemoglobin/hematocrit is now 9.6/30 1.5; MCV was 71.3 -  Checked anemia panel showed an iron level of 17, U IBC of 250, TIBC of 267, saturation ratios of 6%, ferritin level of 72, folate level of 38.0, and vitamin B12 level of 211 -Continue to monitor for signs and symptoms of bleeding; currently no overt bleeding noted -Repeat CBC in a.m.  Super Morbid Obesity -Complicates her overall care and her overall prognosis -Estimated body mass index is 51.65 kg/m as calculated from the following:   Height as of this encounter: 5\' 6"  (1.676 m).   Weight  as of this encounter: 145.2 kg. -Weight Loss and Dietary Counseling given   DVT prophylaxis: SCDs for surgical procedure  Code Status: FULL CODE  Family Communication: Discussed with family at bedside  Disposition Plan: Pending further evaluation improvement of her issues and clearance by podiatry  Status is: Inpatient  Remains inpatient appropriate because:Ongoing diagnostic testing needed not appropriate for outpatient work up, Unsafe d/c plan, IV treatments appropriate due to intensity of illness or inability to take PO and Inpatient level of care appropriate due to severity of illness   Dispo: The patient is from: Home              Anticipated d/c is to: TBD              Anticipated d/c date is: 2 days              Patient currently is not medically stable to d/c.  Consultants:   Podiatry    Procedures: ABI  I and D to be done and Possible Bone Excision and Biopsy by Dr. Ether Griffins  Antimicrobials:  Anti-infectives (From admission, onward)   Start     Dose/Rate Route Frequency Ordered Stop   01/11/20 1600  [MAR Hold]  vancomycin (VANCOREADY) IVPB 1500 mg/300 mL        (MAR Hold since Fri 01/11/2020 at 1136.Hold Reason: Transfer to a Procedural area.)   1,500 mg 150 mL/hr over 120 Minutes Intravenous Every 12 hours 01/11/20 0937     01/10/20 1830  [MAR Hold]  cefTRIAXone (ROCEPHIN) 2 g in sodium chloride 0.9 % 100 mL IVPB        (MAR Hold since Fri 01/11/2020 at 1136.Hold Reason: Transfer to a Procedural area.)   2 g 200 mL/hr over 30 Minutes Intravenous Daily 01/10/20 1730     01/10/20 1600  vancomycin (VANCOREADY) IVPB 1250 mg/250 mL  Status:  Discontinued        1,250 mg 166.7 mL/hr over 90 Minutes Intravenous Every 12 hours 01/10/20 0436 01/11/20 0937   01/10/20 0900  aztreonam (AZACTAM) 1 g in sodium chloride 0.9 % 100 mL IVPB  Status:  Discontinued        1 g 200 mL/hr over 30 Minutes Intravenous Every 8 hours 01/10/20 0824 01/10/20 1730   01/10/20 0830  vancomycin  (VANCOCIN) IVPB 1000 mg/200 mL premix  Status:  Discontinued        1,000 mg 200 mL/hr over 60 Minutes Intravenous  Once 01/10/20 0824 01/10/20 0838   01/10/20 0830  vancomycin (VANCOCIN) IVPB 1000 mg/200 mL premix  Status:  Discontinued        1,000 mg 200 mL/hr over 60 Minutes Intravenous  Once 01/10/20 0827 01/10/20 0838   01/10/20 0445  vancomycin (VANCOREADY) IVPB 1500 mg/300 mL        1,500 mg 150 mL/hr over 120 Minutes Intravenous  Once 01/10/20 0433 01/10/20 0800   01/10/20 0345  vancomycin (VANCOCIN) IVPB 1000 mg/200 mL premix  1,000 mg 200 mL/hr over 60 Minutes Intravenous  Once 01/10/20 4098 01/10/20 0502        Subjective: Seen and examined at bedside and states that she had a rough night and states her foot was hurting as well as her back.  Understand that she is going to go for surgical intervention later today.  No nausea or vomiting.  No other concerns or complaints at this time.  Objective: Vitals:   01/11/20 0257 01/11/20 0735 01/11/20 0829 01/11/20 1149  BP: (!) 111/53 121/67  124/64  Pulse: 86 89  90  Resp: 18 18  18   Temp: 99.4 F (37.4 C) 97.9 F (36.6 C)  98.2 F (36.8 C)  TempSrc: Oral   Oral  SpO2: 93% 95% 97% 98%  Weight:      Height:        Intake/Output Summary (Last 24 hours) at 01/11/2020 1233 Last data filed at 01/11/2020 1191 Gross per 24 hour  Intake 1386.67 ml  Output --  Net 1386.67 ml   Filed Weights   01/09/20 2220  Weight: (!) 145.2 kg   Examination: Physical Exam:  Constitutional: WN/WD super morbidly obese Caucasian female currently in NAD and appears calm but a little uncomfortable Eyes: Lids and conjunctivae normal, sclerae anicteric  ENMT: External Ears, Nose appear normal. Grossly normal hearing.  Neck: Appears normal, supple, no cervical masses, normal ROM, no appreciable thyromegaly: No JVD Respiratory: Diminished to auscultation bilaterally, no wheezing, rales, rhonchi or crackles. Normal respiratory effort and  patient is not tachypenic. No accessory muscle use.  Cardiovascular: RRR, no murmurs / rubs / gallops. S1 and S2 auscultated.  Has 1+ lower extremity edema on the left Abdomen: Soft, non-tender, distended secondary body habitus.  Has a ventral hernia noted. bowel sounds positive.  GU: Deferred. Musculoskeletal: No clubbing / cyanosis of digits/nails.  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 No induration; Warm and dry.  Neurologic: CN 2-12 grossly intact with no focal deficits. Romberg sign and cerebellar reflexes not assessed.  Psychiatric: Normal judgment and insight. Alert and oriented x 3. Normal mood and appropriate affect.   Data Reviewed: I have personally reviewed following labs and imaging studies  CBC: Recent Labs  Lab 01/09/20 2224 01/10/20 0541 01/10/20 1023 01/11/20 0420  WBC 16.3* 16.3* 14.0* 11.4*  NEUTROABS  --   --  11.2* 9.0*  HGB 10.3* 9.8* 9.6* 8.3*  HCT 33.5* 32.7* 31.5* 28.8*  MCV 70.8* 71.6* 71.3* 73.1*  PLT 343 323 307 288   Basic Metabolic Panel: Recent Labs  Lab 01/09/20 2224 01/10/20 0541 01/10/20 1023 01/11/20 0420  NA 136 138 135 138  K 4.1 4.2 3.8 3.7  CL 101 103 104 107  CO2 24 26 22 24   GLUCOSE 179* 169* 183* 146*  BUN 29* 32* 30* 22*  CREATININE 1.05* 1.35* 1.00 0.79  CALCIUM 8.8* 8.4* 8.4* 8.3*  MG  --   --   --  1.8  PHOS  --   --   --  3.0   GFR: Estimated Creatinine Clearance: 113.4 mL/min (by C-G formula based on SCr of 0.79 mg/dL). Liver Function Tests: Recent Labs  Lab 01/10/20 1023 01/11/20 0420  AST 19 16  ALT 17 16  ALKPHOS 70 63  BILITOT 1.0 0.6  PROT 7.2 6.7  ALBUMIN 3.0* 2.7*   No results for input(s): LIPASE, AMYLASE in the last 168 hours. No results for input(s): AMMONIA in the last 168 hours. Coagulation Profile: Recent Labs    Lab 01/10/20 0541 01/10/20 1023  INR 1.2 1.2   Cardiac Enzymes: No results for input(s): CKTOTAL, CKMB, CKMBINDEX, TROPONINI in the last 168 hours. BNP (last 3 results) No results for input(s): PROBNP in the last 8760 hours. HbA1C: Recent Labs    01/10/20 0541  HGBA1C 10.8*   CBG: Recent Labs  Lab 01/10/20 1230 01/10/20 1558 01/10/20 2129 01/11/20 0736 01/11/20 1123  GLUCAP 192* 160* 162* 122* 148*   Lipid Profile: No results for input(s): CHOL, HDL, LDLCALC, TRIG, CHOLHDL, LDLDIRECT in the last 72 hours. Thyroid Function Tests: No results for input(s): TSH, T4TOTAL, FREET4, T3FREE, THYROIDAB in the last 72 hours. Anemia Panel: Recent Labs    01/11/20 0420  VITAMINB12 211  FOLATE 38.0  FERRITIN 72  TIBC 267  IRON 17*  RETICCTPCT 2.2   Sepsis Labs: Recent Labs  Lab 01/10/20 0214 01/10/20 0354 01/10/20 0541 01/10/20 0832 01/10/20 1023  PROCALCITON  --   --   --   --  0.16  LATICACIDVEN 1.9 1.3 1.1 1.3  --     Recent Results (from the past 240 hour(s))  Blood culture (routine x 2)     Status: None (Preliminary result)   Collection Time: 01/10/20  2:14 AM   Specimen: BLOOD  Result Value Ref Range Status   Specimen Description BLOOD RIGHT FA  Final   Special Requests   Final    BOTTLES DRAWN AEROBIC AND ANAEROBIC Blood Culture adequate volume   Culture   Final    NO GROWTH 1 DAY Performed at [facility], [address], [place], [place] [address]    Report Status PENDING  Incomplete  Blood culture (routine x 2)     Status: None (Preliminary result)   Collection Time: 01/10/20  3:54 AM   Specimen: BLOOD  Result Value Ref Range Status   Specimen Description BLOOD RIGHT [facility]  Final   Special Requests   Final    BOTTLES DRAWN AEROBIC AND ANAEROBIC Blood Culture adequate volume   Culture   Final    NO GROWTH 1 DAY Performed at [facility], [address] [place], [place] [address]    Report Status PENDING  Incomplete   Respiratory Panel by RT PCR (Flu A&B, Covid) - Nasopharyngeal Swab     Status: None   Collection Time: 01/10/20  8:32 AM   Specimen: Nasopharyngeal Swab  Result Value Ref Range Status   SARS Coronavirus 2 by RT PCR NEGATIVE NEGATIVE Final    Comment: (NOTE) SARS-CoV-2 target nucleic acids are NOT DETECTED.  The SARS-CoV-2 RNA is generally detectable in upper respiratoy specimens during the acute phase of infection. The lowest concentration of SARS-CoV-2 viral copies this assay can detect is 131 copies/mL. A negative result does not preclude SARS-Cov-2 infection and should not be used as the sole basis for treatment or other patient management decisions. A negative result may occur with  improper specimen collection/handling, submission of specimen other than nasopharyngeal swab, presence of viral mutation(s) within the areas targeted by this assay, and inadequate number of viral copies (<131 copies/mL). A negative result must be combined with clinical observations, patient history, and epidemiological information. The expected result is Negative.  Fact Sheet for Patients:  https://www.moore.com/  Fact Sheet for Healthcare Providers:  https://www.young.biz/  This test is no  t yet approved or cleared by the [place] and  has been authorized for detection and/or diagnosis of SARS-CoV-2 by FDA under an Emergency Use Authorization (EUA). This EUA will remain  in effect (meaning this test can be used) for the duration of the COVID-19 declaration under Section 564(b)(1) of the Act, 21 U.S.C. section 360bbb-3(b)(1), unless the authorization is terminated or revoked sooner.     Influenza A by PCR NEGATIVE NEGATIVE Final   Influenza B by PCR NEGATIVE NEGATIVE Final    Comment: (NOTE) The Xpert Xpress SARS-CoV-2/FLU/RSV assay is intended as an aid in  the diagnosis of influenza from Nasopharyngeal swab specimens and  should not be used as  a sole basis for treatment. Nasal washings and  aspirates are unacceptable for Xpert Xpress SARS-CoV-2/FLU/RSV  testing.  Fact Sheet for Patients: https://www.moore.com/  Fact Sheet for Healthcare Providers: https://www.young.biz/  This test is not yet approved or cleared by the [place] FDA and  has been authorized for detection and/or diagnosis of SARS-CoV-2 by  FDA under an Emergency Use Authorization (EUA). This EUA will remain  in effect (meaning this test can be used) for the duration of the  Covid-19 declaration under Section 564(b)(1) of the Act, 21  U.S.C. section 360bbb-3(b)(1), unless the authorization is  terminated or revoked. Performed at [facility], [address] [place], [place] [address]      RN Pressure Injury Documentation:     Estimated body mass index is 51.65 kg/m as calculated from the following:   Height as of this encounter: 5\' 6"  (1.676 m).   Weight as of this encounter: 145.2 kg.  Malnutrition Type:      Malnutrition Characteristics:      Nutrition Interventions:    Radiology Studies: CT FOOT LEFT WO CONTRAST  Result Date: 01/10/2020 CLINICAL DATA:  Left foot pain.  Diabetic ulcer.  Foreign body. EXAM: CT OF THE LEFT FOOT WITHOUT CONTRAST TECHNIQUE: Multidetector CT imaging of the left foot was performed according to the standard protocol. Multiplanar CT image reconstructions were also generated. COMPARISON:  X-ray 01/10/2020, 01/22/2014 FINDINGS: Bones/Joint/Cartilage No acute fracture. No dislocation. There is a well circumscribed erosion with partially corticated margins along the plantar surface of the lateral hallux sesamoid (series 6, images 47-48). This finding is new compared to prior foot radiographs from 01/22/2014. Medial hallux sesamoid intact. There is also loss of bony substance along the lateral aspect of the first metatarsal head (series 4, images 104-106). Additional  small well-marginated erosion along the plantar-medial cortex of the first metatarsal head (series 4, image 100). There is a small first MTP joint effusion. There is a healed fracture of the fourth digit proximal phalanx. Ligaments Suboptimally assessed by CT. Muscles and Tendons Fatty atrophy of the intrinsic foot musculature suggestive of chronic denervation changes. Flexor and extensor tendons are grossly intact. Soft tissues Soft tissue ulceration at the plantar aspect of the foot underlying the first metatarsal head. Small ill-defined fluid collection adjacent to the lateral aspect of the first metatarsal head within the first intermetatarsal space measuring approximately 1.2 cm (series 7, image 43; series 5, image 110). Moderate diffuse dorsal soft tissue edema. 7 mm linear radiopaque foreign body is seen within the plantar soft tissues of the forefoot underlying the third MTP joint, just superficial to the third flexor tendon (series 8, images 40-42). IMPRESSION: 1. Soft tissue ulceration at the plantar aspect of the left foot underlying the first metatarsal head. Erosive changes along the lateral aspect of  the first metatarsal head and within the lateral hallux sesamoid, which have partially corticated margins. Findings suggest sequela of osteomyelitis, likely acute on chronic. 2. Small ill-defined fluid collection adjacent to the lateral aspect of the first metatarsal head within the first intermetatarsal space measuring approximately 1.2 cm, concerning for an abscess. 3. Small first MTP joint effusion, which may reflect septic arthritis. 4. 7 mm linear radiopaque foreign body within the plantar soft tissues of the forefoot underlying the third MTP joint. Electronically Signed   By: Duanne Guess D.O.   On: 01/10/2020 14:52   US Venous Img Lower Unilateral Left  Result Date: 01/10/2020 CLINICAL DATA:  Left lower extremity pain and swelling EXAM: LEFT LOWER EXTREMITY VENOUS DOPPLER ULTRASOUND  TECHNIQUE: Gray-scale sonography with compression, as well as color and duplex ultrasound, were performed to evaluate the deep venous system(s) from the level of the common femoral vein through the popliteal and proximal calf veins. COMPARISON:  None. FINDINGS: VENOUS Normal compressibility of the common femoral, superficial femoral, and popliteal veins, as well as the visualized calf veins. Visualized portions of profunda femoral vein and great saphenous vein unremarkable. No filling defects to suggest DVT on grayscale or color Doppler imaging. Doppler waveforms show normal direction of venous flow, normal respiratory plasticity and response to augmentation. Limited views of the contralateral common femoral vein are unremarkable. OTHER None. Limitations: none IMPRESSION: Negative. Electronically Signed   By: Helyn Numbers MD   On: 01/10/2020 04:31   US ARTERIAL ABI (SCREENING LOWER EXTREMITY)  Result Date: 01/10/2020 CLINICAL DATA:  Previous smoker, obesity, diabetes, left leg pain and ulceration EXAM: NONINVASIVE PHYSIOLOGIC VASCULAR STUDY OF BILATERAL LOWER EXTREMITIES TECHNIQUE: Evaluation of both lower extremities were performed at rest, including calculation of ankle-brachial indices with single level Doppler, pressure and pulse volume recording. COMPARISON:  None. FINDINGS: Right ABI:  1.20 Left ABI:  0.92 Right Lower Extremity:  Normal arterial waveforms at the ankle. Left Lower Extremity: Monophasic arterial waveforms at the left ankle 0.9-0.99 Borderline PAD IMPRESSION: Normal right resting ABI Mildly decreased left resting ABI, borderline peripheral arterial disease range Electronically Signed   By: Judie Petit.  Shick M.D.   On: 01/10/2020 11:17   DG Foot Complete Left  Result Date: 01/10/2020 CLINICAL DATA:  Diabetic foot ulcer.  Redness and swelling. EXAM: LEFT FOOT - COMPLETE 3+ VIEW COMPARISON:  01/22/2014. FINDINGS: Diffuse severe soft tissue swelling. Soft tissue ulceration noted along the distal  plantar aspect of the left foot. Overlying thin linear metallic density noted at the level of the distal second and third metatarsals along the plantar aspect the foot. This is most likely a foreign body. No acute or focal bony abnormality identified. No bony erosions are identified. IMPRESSION: 1. Diffuse severe soft tissue swelling. Soft tissue ulceration noted along the distal plantar aspect of the left foot. 2. Overlying thin linear metallic density noted at the level the distal second and third metatarsals along the plantar aspect of the foot suggesting a foreign body. 3.  No acute bony abnormality.  No bony erosions. Electronically Signed   By: Maisie Fus  Register   On: 01/10/2020 12:23   Scheduled Meds: . [MAR Hold] ARIPiprazole  5 mg Oral Daily  . [MAR Hold] ascorbic acid  500 mg Oral Daily  . [MAR Hold] atorvastatin  80 mg Oral Daily  . [MAR Hold] budesonide  0.5 mg Nebulization BID  . [MAR Hold] busPIRone  10 mg Oral BID  . [MAR Hold] calcium citrate-vitamin D  0.5 tablet Oral Daily  . [  MAR Hold] cholecalciferol  5,000 Units Oral Daily  . [MAR Hold] famotidine  20 mg Oral Daily  . [MAR Hold] FLUoxetine  20 mg Oral Daily  . [MAR Hold] folic acid  1 mg Oral Daily  . [MAR Hold] insulin aspart  0-9 Units Subcutaneous TID PC & HS  . [MAR Hold] insulin glargine  22 Units Subcutaneous BID  . [MAR Hold] ipratropium-albuterol  3 mL Nebulization TID  . [MAR Hold] levothyroxine  88 mcg Oral QAC breakfast  . [MAR Hold] mupirocin ointment  1 application Topical BID  . [MAR Hold] potassium chloride SA  10 mEq Oral Daily  . povidone-iodine  2 application Topical Once  . [MAR Hold] pramipexole  0.25 mg Oral QHS  . [MAR Hold] pregabalin  150 mg Oral TID  . [MAR Hold] QUEtiapine  300 mg Oral QHS  . [MAR Hold] zolpidem  5 mg Oral QHS   Continuous Infusions: . sodium chloride 100 mL/hr at 01/11/20 0425  . [MAR Hold] cefTRIAXone (ROCEPHIN)  IV 2 g (01/11/20 0930)  . [MAR Hold] vancomycin      LOS: 1  day   Merlene Laughter, DO [facility] is on AMION  If 7PM-7AM, please contact night-coverage [email or web address]

## 2020-01-11 NOTE — Anesthesia Preprocedure Evaluation (Addendum)
Anesthesia Evaluation  Patient identified by MRN, date of birth, ID band Patient awake    Reviewed: Allergy & Precautions, NPO status , Patient's Chart, lab work & pertinent test results  History of Anesthesia Complications Negative for: history of anesthetic complications  Airway Mallampati: III  TM Distance: >3 FB Neck ROM: Full    Dental  (+) Edentulous Upper, Edentulous Lower, Dental Advisory Given   Pulmonary sleep apnea , pneumonia, resolved, COPD, Patient abstained from smoking.Not current smoker, former smoker,  Occasionally uses oxygen at home , ever since covid pneumonia in Dec 2020.   Pulmonary exam normal breath sounds clear to auscultation       Cardiovascular Exercise Tolerance: Good METShypertension, +CHF  (-) CAD and (-) Past MI (-) dysrhythmias  Rhythm:Regular Rate:Normal - Systolic murmurs TTE 2021: 1. Left ventricular ejection fraction, by estimation, is 60 to 65%. The  left ventricle has normal function. The left ventricle has no regional  wall motion abnormalities. Left ventricular diastolic parameters are  consistent with Grade I diastolic  dysfunction (impaired relaxation).  2. Right ventricular systolic function is normal. The right ventricular  size is normal.  3. The inferior vena cava is normal in size with greater than 50%  respiratory variability, suggesting right atrial pressure of 3 mmHg.    Neuro/Psych PSYCHIATRIC DISORDERS Anxiety Depression Bipolar Disorder    GI/Hepatic GERD  ,(+)     (-) substance abuse  ,   Endo/Other  diabetesHypothyroidism Morbid obesity  Renal/GU      Musculoskeletal   Abdominal (+) + obese,   Peds  Hematology   Anesthesia Other Findings Past Medical History: 01/29/2013: Abdominal wall hernia No date: Anxiety No date: Arthritis     Comment:  Rheumatoid No date: C. difficile colitis No date: Chronic diastolic heart failure (HCC) 03/23/2019:  COVID-19     Comment:  Diagnosed at [facility] (send-out) on 03/23/2019 No date: Depression No date: Diabetes mellitus     Comment:  states no meds or diet restrictions  at present No date: Diastolic CHF (HCC) No date: Esophagitis No date: Fluid retention No date: GERD (gastroesophageal reflux disease) No date: Hiatal hernia No date: Hypertension 10/21/2015: Hypokalemia due to loss of potassium     Comment:  Overview:  Associated with 3 weeks of diarrhea  And QT               prolongation. No date: Hypothyroidism No date: IBS (irritable bowel syndrome) 06/03/2004: Moderate episode of recurrent major depressive disorder  (HCC) No date: Morbid obesity (HCC) 11/2017: MRSA (methicillin resistant Staphylococcus aureus) infection     Comment:  left inner thigh abcess No date: Neurogenic bladder     Comment:  has pacemaker No date: Neuropathy No date: Obesity No date: Panic attacks No date: Pneumonia due to COVID-19 virus No date: Rheumatoid arthritis (HCC) No date: Sleep apnea     Comment:  STATES SEVERE, CANT TOLERATE MASK- LAST STUDY YEARS AGO  Reproductive/Obstetrics                            Anesthesia Physical Anesthesia Plan  ASA: III  Anesthesia Plan: General   Post-op Pain Management:    Induction: Intravenous  PONV Risk Score and Plan: Ondansetron and Dexamethasone  Airway Management Planned: Oral ETT  Additional Equipment: None  Intra-op Plan:   Post-operative Plan: Extubation in OR  Informed Consent: I have reviewed the patients History and Physical, chart, labs and discussed the procedure  including the risks, benefits and alternatives for the proposed anesthesia with the patient or authorized representative who has indicated his/her understanding and acceptance.     Dental advisory given  Plan Discussed with: CRNA and Surgeon  Anesthesia Plan Comments: (Discussed risks of anesthesia with patient, including PONV, sore throat, lip/dental  damage. Rare risks discussed as well, such as cardiorespiratory and neurological sequelae. Patient understands. Patient counseled on being higher risk for anesthesia due to comorbidities: morbid obesity. Patient was told about increased risk of cardiac and respiratory events, including death. Patient understands. )        Anesthesia Quick Evaluation

## 2020-01-11 NOTE — Anesthesia Procedure Notes (Signed)
Procedure Name: Intubation Date/Time: 01/11/2020 12:25 PM Performed by: Rodney Booze, CRNA Pre-anesthesia Checklist: Patient identified, Emergency Drugs available, Suction available and Patient being monitored Patient Re-evaluated:Patient Re-evaluated prior to induction Oxygen Delivery Method: Circle system utilized Preoxygenation: Pre-oxygenation with 100% oxygen Induction Type: IV induction Ventilation: Mask ventilation without difficulty Laryngoscope Size: McGraph and 3 Grade View: Grade I Tube type: Oral Number of attempts: 1 Airway Equipment and Method: Stylet and Patient positioned with wedge pillow Placement Confirmation: ETT inserted through vocal cords under direct vision,  positive ETCO2 and breath sounds checked- equal and bilateral Secured at: 21 cm Tube secured with: Tape Dental Injury: Teeth and Oropharynx as per pre-operative assessment

## 2020-01-11 NOTE — Transfer of Care (Signed)
Immediate Anesthesia Transfer of Care Note  Patient: [name] [name]  Procedure(s) Performed: IRRIGATION AND DEBRIDEMENT FOOT (Left )  Patient Location: PACU  Anesthesia Type:General  Level of Consciousness: awake and drowsy  Airway & Oxygen Therapy: Patient Spontanous Breathing and Patient connected to face mask oxygen  Post-op Assessment: Report given to RN and Post -op Vital signs reviewed and stable  Post vital signs: stable  Last Vitals:  Vitals Value Taken Time  BP 102/70 01/11/20 1325  Temp    Pulse 78 01/11/20 1326  Resp 19 01/11/20 1326  SpO2 95 % 01/11/20 1326  Vitals shown include unvalidated device data.  Last Pain:  Vitals:   01/11/20 1321  TempSrc:   PainSc: (P) Asleep         Complications: No complications documented.

## 2020-01-11 NOTE — Progress Notes (Signed)
Pt laying in bed. Scheduled meds given. Pt in 7/10 pain, PRN meds given. Pt denies any further needs at this time. Call bell within reach, RN number on pt board.

## 2020-01-11 NOTE — Progress Notes (Signed)
Pt off the floor

## 2020-01-12 LAB — COMPREHENSIVE METABOLIC PANEL
ALT: 14 U/L (ref 0–44)
AST: 15 U/L (ref 15–41)
Albumin: 2.6 g/dL — ABNORMAL LOW (ref 3.5–5.0)
Alkaline Phosphatase: 69 U/L (ref 38–126)
Anion gap: 8 (ref 5–15)
BUN: 14 mg/dL (ref 6–20)
CO2: 22 mmol/L (ref 22–32)
Calcium: 8.5 mg/dL — ABNORMAL LOW (ref 8.9–10.3)
Chloride: 110 mmol/L (ref 98–111)
Creatinine, Ser: 0.68 mg/dL (ref 0.44–1.00)
GFR calc Af Amer: >60 mL/min (ref >60)
GFR calc non Af Amer: >60 mL/min (ref >60)
Glucose, Bld: 144 mg/dL — ABNORMAL HIGH (ref 70–99)
Potassium: 3.8 mmol/L (ref 3.5–5.1)
Sodium: 140 mmol/L (ref 135–145)
Total Bilirubin: 0.6 mg/dL (ref 0.3–1.2)
Total Protein: 6.5 g/dL (ref 6.5–8.1)

## 2020-01-12 LAB — CBC WITH DIFFERENTIAL/PLATELET
Abs Immature Granulocytes: 0.1 10*3/uL — ABNORMAL HIGH (ref 0.00–0.07)
Basophils Absolute: 0.1 10*3/uL (ref 0.0–0.1)
Basophils Relative: 1 %
Eosinophils Absolute: 0 10*3/uL (ref 0.0–0.5)
Eosinophils Relative: 0 %
HCT: 28.1 % — ABNORMAL LOW (ref 36.0–46.0)
Hemoglobin: 8.1 g/dL — ABNORMAL LOW (ref 12.0–15.0)
Immature Granulocytes: 1 %
Lymphocytes Relative: 14 %
Lymphs Abs: 1.3 10*3/uL (ref 0.7–4.0)
MCH: 21.3 pg — ABNORMAL LOW (ref 26.0–34.0)
MCHC: 28.8 g/dL — ABNORMAL LOW (ref 30.0–36.0)
MCV: 73.8 fL — ABNORMAL LOW (ref 80.0–100.0)
Monocytes Absolute: 0.6 10*3/uL (ref 0.1–1.0)
Monocytes Relative: 7 %
Neutro Abs: 6.9 10*3/uL (ref 1.7–7.7)
Neutrophils Relative %: 77 %
Platelets: 274 10*3/uL (ref 150–400)
RBC: 3.81 MIL/uL — ABNORMAL LOW (ref 3.87–5.11)
RDW: 19.6 % — ABNORMAL HIGH (ref 11.5–15.5)
WBC: 8.9 10*3/uL (ref 4.0–10.5)
nRBC: 0 % (ref 0.0–0.2)

## 2020-01-12 LAB — GLUCOSE, CAPILLARY
Glucose-Capillary: 120 mg/dL — ABNORMAL HIGH (ref 70–99)
Glucose-Capillary: 151 mg/dL — ABNORMAL HIGH (ref 70–99)
Glucose-Capillary: 142 mg/dL — ABNORMAL HIGH (ref 70–99)
Glucose-Capillary: 154 mg/dL — ABNORMAL HIGH (ref 70–99)

## 2020-01-12 LAB — MAGNESIUM: Magnesium: 1.9 mg/dL (ref 1.7–2.4)

## 2020-01-12 LAB — PHOSPHORUS: Phosphorus: 3.3 mg/dL (ref 2.5–4.6)

## 2020-01-12 MED ORDER — IPRATROPIUM-ALBUTEROL 0.5-2.5 (3) MG/3ML IN SOLN
3.0000 mL | Freq: Two times a day (BID) | RESPIRATORY_TRACT | Status: DC
  Administered 2020-01-13 – 2020-01-15 (×5): 3 mL via RESPIRATORY_TRACT
  Filled 2020-01-12 (×5): qty 3

## 2020-01-12 NOTE — Progress Notes (Signed)
Daily Progress Note   Subjective  - 1 Day Post-Op  Follow-up I&D with sesamoid removal left foot.  Doing well.  No complaints of pain.  Objective Vitals:   01/11/20 2345 01/12/20 0420 01/12/20 0737 01/12/20 0746  BP: 135/68 106/62  108/65  Pulse: 83 93  84  Resp: 16 16  16   Temp: 98.4 F (36.9 C) 99 F (37.2 C)  98.4 F (36.9 C)  TempSrc: Oral Oral  Oral  SpO2: 98% 95% 96% 96%  Weight:      Height:        Physical Exam: Dressing changed.  No purulence.  Packing removed.  Surrounding erythema is stable.  Culture no growth 1 day.  Pathology pending  Laboratory CBC    Component Value Date/Time   WBC 8.9 01/12/2020 0518   HGB 8.1 (L) 01/12/2020 0518   HGB 12.1 05/27/2014 1439   HCT 28.1 (L) 01/12/2020 0518   HCT 37.5 05/27/2014 1439   PLT 274 01/12/2020 0518   PLT 221 05/27/2014 1439    BMET    Component Value Date/Time   NA 140 01/12/2020 0518   NA 134 11/16/2018 1539   NA 136 05/27/2014 1439   K 3.8 01/12/2020 0518   K 3.8 05/27/2014 1439   CL 110 01/12/2020 0518   CL 100 05/27/2014 1439   CO2 22 01/12/2020 0518   CO2 28 05/27/2014 1439   GLUCOSE 144 (H) 01/12/2020 0518   GLUCOSE 270 (H) 05/27/2014 1439   BUN 14 01/12/2020 0518   BUN 13 11/16/2018 1539   BUN 6 (L) 05/27/2014 1439   CREATININE 0.68 01/12/2020 0518   CREATININE 0.83 05/27/2014 1439   CALCIUM 8.5 (L) 01/12/2020 0518   CALCIUM 8.5 05/27/2014 1439   GFRNONAA >60 01/12/2020 0518   GFRNONAA >60 05/27/2014 1439   GFRNONAA >60 12/03/2013 0511   GFRAA >60 01/12/2020 0518   GFRAA >60 05/27/2014 1439   GFRAA >60 12/03/2013 0511    Assessment/Planning: Abscess status post I&D with sesamoid removal left foot   Dressing changed today.  Will follow cultures.  Will follow pathology.  Remove the sesamoid bone.  Remainder of the joint looked to be stable.  Small area of drainage in the first webspace noted.  Deep cultures performed and will follow while in house.  White blood cell count is  trending down.  Patient should remain nonweightbearing to the left foot.  We will follow-up for dressing change tomorrow.  12/05/2013 A  01/12/2020, 9:47 AM

## 2020-01-12 NOTE — Progress Notes (Signed)
PROGRESS NOTE    [name] [name]  KYH:[id number] DOB: [birth year] DOA: 01/10/2020 PCP: [facility]   Brief Narrative:  The patient is a super morbidly obese Caucasian female with a past medical history significant for but not limited to anxiety, abdominal wall hernia, rheumatoid arthritis, history of C. difficile colitis, history of chronic diastolic heart failure, history of COVID-19 disease, depression, uncontrolled diabetes mellitus type 2, hypertension, irritable bowel syndrome, major depressive disorder, history of MRSA infection, history of panic attacks, as well as other comorbidities as well as sleep apnea who presented with leg pain and noticed worsening left lower extremity swelling erythema and tenderness that extended from her left foot to her upper leg.  She has a left plantar big toe ulcer that she stated was MRSA positive and admitted to having some chills but denied any fever.  She denies any nausea or vomiting.  She presented to the emergency room and found a blood pressure 99/55 and a pulse of 116 and labs revealed leukocytosis of 16.3 and lactic acid level is 1.9.  She was given IV fentanyl for the pain and given 1 L bolus of normal saline as well as IV vancomycin she is admitted for further evaluation.  **Interim History The patient was also given IV Azactam and this was changed to IV ceftriaxone.  Podiatry was consulted for further evaluation recommendations and they recommend x-rays of the foot.  It showed diffuse soft tissue swelling and soft tissue ulceration noted at the distal plantar aspect of the foot as well as an overlying thin linear metallic density noted at the level of the second and third metatarsals along the plantar aspect of the foot suggesting a foreign body.  Because the patient was unable to undergo an MRI of the foot given her bladder stimulator she had a foot CT scan done which showed soft tissue ulceration at the plantar aspect of the left foot underlying  the first metatarsal head as well as erosive changes along the lateral aspect of the first metatarsal head and within the lateral hallux sesamoid were suggestive of osteomyelitis which was acute on chronic.  Patient also had a small ill-defined fluid collection adjacent to the lateral aspect of the first metatarsal head within the first intermetatarsal space measuring approximately 1.2 cm concerning for an abscess.  Patient also had small first MTP joint effusion which may reflected septic arthritis and a 7 mm linear radiopaque foreign body at the plantar soft tissues of the forefoot in line with the third MTP joint.  Podiatric surgery is planning on taking the patient to the OR today for an I&D and possible bone excision and biopsy and this was done 01/11/2020.  01/12/20 The patient is Status post I&D of the abscess of the left webspace as well as excision of the fibular sesamoid bone of the left foot and is POD1. Cx's are pending and her dressing was changed today by podiatry. CBG's Continue to remain very well controlled and white blood cell count is trending down. Will discuss with ID about Abx Management and Duration. Podiatry recommending NWB to the Left Foot    Assessment & Plan:   Active Problems:   Lower extremity cellulitis  Left lower extremity severe nonpurulent cellulitis with subsequent sepsis without severe sepsis or septic shock.  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 -She wa admitted to a medical monitored bed. -She will be continued on IV antibiotic therapy with vancomycin especially given her MRSA positive infected left plantar great toe wound andgiven allergy toIV cefepimeand meropenem she was given IV Azactam however after further review she was  tolerant of IV ceftriaxone and will de-escalate and continue IV vancomycin and continue IV ceftriaxone for now  -Foot x-rays at outside facility but then had them repeated here -Foot X-Ray showed "Diffuse severe soft tissue swelling. Soft tissue ulceration noted along the distal plantar aspect of the left foot. 2. Overlying thin linear metallic density noted at the level the distal second and third metatarsals along the plantar aspect of the foot suggesting a foreign body. 3.  No acute bony abnormality.  No bony erosions" -Because of this podiatry recommended obtaining MRI but is unable to be done given her bladder stimulator so CT scan of the foot was done without contrast -CT scan of the left foot without contrast showed "Soft tissue ulceration at the plantar aspect of the left foot underlying the first metatarsal head. Erosive changes along the lateral aspect of the first metatarsal head and within the lateral hallux sesamoid, which have partially corticated margins. Findings suggest sequela of osteomyelitis, likely acute on chronic. 2. Small ill-defined fluid collection adjacent to the lateral aspect of the first metatarsal head within the first intermetatarsal space measuring approximately 1.2 cm, concerning for an abscess. 3. Small first MTP joint effusion, which may reflect septic arthritis. 4. 7 mm linear radiopaque foreign body within the plantar soft tissues of the forefoot underlying the third MTP joint."  -WBC is improving and went from 16.3 -> 14.0 -> 11.4 -> 8.9 -Lactic acid level was normal at 1.9 and trended down to 1.1 and repeat is now 1.3; her procalcitonin level was 0.16 -Pain management will be provided and she has Oxycodone IR 5 mg po q6hprn.  Patient also has pregabalin 150 g p.o. 3 times daily -Warm compresses will be applied. -IVF now stopped  -We will continue mupirocin ointment. -Wound care consult will be obtained. -Have consulted Podiatry Dr. Ether Griffins for further evaluation  recommendations and will be obtaining ABIs -Underwent surgical intervention yesterday and had an I&D and sesamoid bone removal is postoperative day 1 -Surgical cultures are still pending -We will continue current antibiotics and discuss with infectious diseases  Anxiety/Depression. -We will continue her alprazolam 0.5 mg p.o. twice daily as needed anxiety, fluoxetine 20 mg p.o. daily, buspirone 10 mg p.o. twice daily, quetiapine 300 g p.o. daily nightly, pregabalin 150 mg p.o. 3 times daily, and aripiprazole 5 mg p.o. daily  Uncontrolled Type 2 diabetes mellitus with peripheral neuropathy. -She will be placed on supplemental coverage with NovoLog and will continue her basal coverage.  Currently getting 22 units of Lantus subcu twice daily as well as sensitive NovoLog sliding scale insulin AC -Metformin will be held off. -Hemoglobin A1c was 10.8 -We will continue Lyrica for her neuropathy -Blood sugars have been ranging from 146-183 on daily BMP/CMP's: CBGs ranging from 120-159  Hypothyroidism. -We will continue levothyroxine 88 mcg p.o. daily and check TSH level.  Chronic Diastolic CHF -Currently not in exacerbation -Her Lasix is being held given her sepsis physiology and AKI and will reduce to 75 MLS per hour for another 12 more hours -Strict I's and O's and daily weights  -Continue to monitor extremely carefully and continue to monitor for signs and symptoms  of volume overload  GERD -Continue with Famotidine 20 g p.o. daily  Hyperlipidemia -Continue with Atorvastatin 80 mg po Daily  AKI -Patient had a BUN/creatinine of 32/1.35 on presentation -This is now improving after IV fluid hydration and is now 14/0.68 -IV fluid hydration is now stopped -Avoid further nephrotoxic medications, contrast dyes, hypotension and renally adjust medications  -We will hold her Lasix for now -Continue monitor and trend renal function carefully and repeat CMP in a.m.  Rheumatoid Arthritis -No  longer taking methotrexate or prednisone and her Abatacept infusions have been held for the last 4 to 6 weeks due to foot infection  Microcytic Anemia -Patient's hemoglobin/hematocrit is trending down slowly and went from 9.6/31.5 and trended down to 8.3/20.8 yesterday and today it is 8.0/20.1; MCV is now 73.8 -Checked anemia panel showed an iron level of 17, U IBC of 250, TIBC of 267, saturation ratios of 6%, ferritin level of 72, folate level of 38.0, and vitamin B12 level of 211 -Continue to monitor for signs and symptoms of bleeding; currently no overt bleeding noted -Repeat CBC in a.m.  Super Morbid Obesity -Complicates her overall care and her overall prognosis -Estimated body mass index is 51.65 kg/m as calculated from the following:   Height as of this encounter:  (1.676 m).   Weight as of this encounter: 145.2 kg. -Weight Loss and Dietary Counseling given   DVT prophylaxis: SCDs for surgical procedure  Code Status: FULL CODE  Family Communication: No family present at bedside Disposition Plan: Pending further evaluation improvement of her issues and clearance by podiatry and will discuss with ID about antibiotic regimen  Status is: Inpatient  Remains inpatient appropriate because:Ongoing diagnostic testing needed not appropriate for outpatient work up, Unsafe d/c plan, IV treatments appropriate due to intensity of illness or inability to take PO and Inpatient level of care appropriate due to severity of illness   Dispo: The patient is from: Home              Anticipated d/c is to: TBD              Anticipated d/c date is: 2 days              Patient currently is not medically stable to d/c.  Consultants:   Podiatry   Will discuss with infectious diseases about antibiotic length induration   Procedures: ABI  I and D to be done and Possible Bone Excision and Biopsy by Dr. Ether Griffins  Antimicrobials:  Anti-infectives (From admission, onward)   Start     Dose/Rate  Route Frequency Ordered Stop   01/11/20 1600  vancomycin (VANCOREADY) IVPB 1500 mg/300 mL        1,500 mg 150 mL/hr over 120 Minutes Intravenous Every 12 hours 01/11/20 0937     01/10/20 1830  cefTRIAXone (ROCEPHIN) 2 g in sodium chloride 0.9 % 100 mL IVPB        2 g 200 mL/hr over 30 Minutes Intravenous Daily 01/10/20 1730     01/10/20 1600  vancomycin (VANCOREADY) IVPB 1250 mg/250 mL  Status:  Discontinued        1,250 mg 166.7 mL/hr over 90 Minutes Intravenous Every 12 hours 01/10/20 0436 01/11/20 0937   01/10/20 0900  aztreonam (AZACTAM) 1 g in sodium chloride 0.9 % 100 mL IVPB  Status:  Discontinued        1 g 200 mL/hr over 30 Minutes Intravenous Every 8 hours 01/10/20 0824 01/10/20 1730   01/10/20  0830  vancomycin (VANCOCIN) IVPB 1000 mg/200 mL premix  Status:  Discontinued        1,000 mg 200 mL/hr over 60 Minutes Intravenous  Once 01/10/20 0824 01/10/20 0838   01/10/20 0830  vancomycin (VANCOCIN) IVPB 1000 mg/200 mL premix  Status:  Discontinued        1,000 mg 200 mL/hr over 60 Minutes Intravenous  Once 01/10/20 0827 01/10/20 0838   01/10/20 0445  vancomycin (VANCOREADY) IVPB 1500 mg/300 mL        1,500 mg 150 mL/hr over 120 Minutes Intravenous  Once 01/10/20 0433 01/10/20 0800   01/10/20 0345  vancomycin (VANCOCIN) IVPB 1000 mg/200 mL premix        1,000 mg 200 mL/hr over 60 Minutes Intravenous  Once 01/10/20 0338 01/10/20 0502        Subjective: Seen and examined at bedside and states that foot is feeling a little bit better and the pain is a 5 out of 10 in severity.  She states that she got some sleep last night.  No nausea or vomiting.  Happy that her numbers are improving.  Denies any lightheadedness or dizziness.  No other concerns or complaints at this time.  Objective: Vitals:   01/12/20 0420 01/12/20 0737 01/12/20 0746 01/12/20 1228  BP: 106/62  108/65 110/60  Pulse: 93  84 85  Resp: 16  16 18   Temp: 99 F (37.2 C)  98.4 F (36.9 C) 98.1 F (36.7 C)    TempSrc: Oral  Oral Oral  SpO2: 95% 96% 96% 98%  Weight:      Height:        Intake/Output Summary (Last 24 hours) at 01/12/2020 1553 Last data filed at 01/12/2020 1543 Gross per 24 hour  Intake 2845.75 ml  Output 1300 ml  Net 1545.75 ml   Filed Weights   01/09/20 2220  Weight: (!) 145.2 kg   Examination: Physical Exam:  Constitutional: WN/WD super morbidly obese Caucasian female currently in NAD and appears calm and comfortable Eyes: Lids and conjunctivae normal, sclerae anicteric  ENMT: External Ears, Nose appear normal. Grossly normal hearing.  Neck: Appears normal, supple, no cervical masses, normal ROM, no appreciable thyromegaly; no JVD Respiratory: Diminished to auscultation bilaterally, no wheezing, rales, rhonchi or crackles. Normal respiratory effort and patient is not tachypenic. No accessory muscle use. Unlabored breathing Cardiovascular: RRR, no murmurs / rubs / gallops. S1 and S2 auscultated.  Abdomen: Soft, non-tender, Distended 2/2 body habitus. Bowel sounds positive x4.  GU: Deferred. Musculoskeletal: No clubbing / cyanosis of digits/nails. No joint deformity upper and lower extremities.  Skin: Left foot is wrapped no induration; Warm and dry.  Neurologic: CN 2-12 grossly intact with no focal deficits.  Romberg sign and cerebellar reflexes not assessed.  Psychiatric: Normal judgment and insight. Alert and oriented x 3. Normal mood and appropriate affect.    Data Reviewed: I have personally reviewed following labs and imaging studies  CBC: Recent Labs  Lab 01/09/20 2224 01/10/20 0541 01/10/20 1023 01/11/20 0420 01/12/20 0518  WBC 16.3* 16.3* 14.0* 11.4* 8.9  NEUTROABS  --   --  11.2* 9.0* 6.9  HGB 10.3* 9.8* 9.6* 8.3* 8.1*  HCT 33.5* 32.7* 31.5* 28.8* 28.1*  MCV 70.8* 71.6* 71.3* 73.1* 73.8*  PLT 343 323 307 288 274   Basic Metabolic Panel: Recent Labs  Lab 01/09/20 2224 01/10/20 0541 01/10/20 1023 01/11/20 0420 01/12/20 0518  NA 136 138 135  138 140  K 4.1 4.2 3.8 3.7 3.8  CL 101 103 104 107 110  CO2 24 26 22 24 22   GLUCOSE 179* 169* 183* 146* 144*  BUN 29* 32* 30* 22* 14  CREATININE 1.05* 1.35* 1.00 0.79 0.68  CALCIUM 8.8* 8.4* 8.4* 8.3* 8.5*  MG  --   --   --  1.8 1.9  PHOS  --   --   --  3.0 3.3   GFR: Estimated Creatinine Clearance: 113.4 mL/min (by C-G formula based on SCr of 0.68 mg/dL). Liver Function Tests: Recent Labs  Lab 01/10/20 1023 01/11/20 0420 01/12/20 0518  AST 19 16 15   ALT 17 16 14   ALKPHOS 70 63 69  BILITOT 1.0 0.6 0.6  PROT 7.2 6.7 6.5  ALBUMIN 3.0* 2.7* 2.6*   No results for input(s): LIPASE, AMYLASE in the last 168 hours. No results for input(s): AMMONIA in the last 168 hours. Coagulation Profile: Recent Labs  Lab 01/10/20 0541 01/10/20 1023  INR 1.2 1.2   Cardiac Enzymes: No results for input(s): CKTOTAL, CKMB, CKMBINDEX, TROPONINI in the last 168 hours. BNP (last 3 results) No results for input(s): PROBNP in the last 8760 hours. HbA1C: Recent Labs    01/10/20 0541  HGBA1C 10.8*   CBG: Recent Labs  Lab 01/11/20 1334 01/11/20 1625 01/11/20 2124 01/12/20 0747 01/12/20 1144  GLUCAP 159* 133* 127* 120* 151*   Lipid Profile: No results for input(s): CHOL, HDL, LDLCALC, TRIG, CHOLHDL, LDLDIRECT in the last 72 hours. Thyroid Function Tests: No results for input(s): TSH, T4TOTAL, FREET4, T3FREE, THYROIDAB in the last 72 hours. Anemia Panel: Recent Labs    01/11/20 0420  VITAMINB12 211  FOLATE 38.0  FERRITIN 72  TIBC 267  IRON 17*  RETICCTPCT 2.2   Sepsis Labs: Recent Labs  Lab 01/10/20 0214 01/10/20 0354 01/10/20 0541 01/10/20 0832 01/10/20 1023  PROCALCITON  --   --   --   --  0.16  LATICACIDVEN 1.9 1.3 1.1 1.3  --     Recent Results (from the past 240 hour(s))  Blood culture (routine x 2)     Status: None (Preliminary result)   Collection Time: 01/10/20  2:14 AM   Specimen: BLOOD  Result Value Ref Range Status   Specimen Description BLOOD RIGHT FA   Final   Special Requests   Final    BOTTLES DRAWN AEROBIC AND ANAEROBIC Blood Culture adequate volume   Culture   Final    NO GROWTH 2 DAYS Performed at [facility], [address] [place], [place] [address]    Report Status PENDING  Incomplete  Blood culture (routine x 2)     Status: None (Preliminary result)   Collection Time: 01/10/20  3:54 AM   Specimen: BLOOD  Result Value Ref Range Status   Specimen Description BLOOD RIGHT [facility]  Final   Special Requests   Final    BOTTLES DRAWN AEROBIC AND ANAEROBIC Blood Culture adequate volume   Culture   Final    NO GROWTH 2 DAYS Performed at [facility], [address] [place], [place] [address]    Report Status PENDING  Incomplete  Respiratory Panel by RT PCR (Flu A&B, Covid) - Nasopharyngeal Swab     Status: None   Collection Time: 01/10/20  8:32 AM   Specimen: Nasopharyngeal Swab  Result Value Ref Range Status   SARS Coronavirus 2 by RT PCR NEGATIVE NEGATIVE Final    Comment: (NOTE) SARS-CoV-2 target nucleic acids are NOT DETECTED.  The SARS-CoV-2 RNA is generally detectable in  upper respiratoy specimens during the acute phase of infection. The lowest concentration of SARS-CoV-2 viral copies this assay can detect is 131 copies/mL. A negative result does not preclude SARS-Cov-2 infection and should not be used as the sole basis for treatment or other patient management decisions. A negative result may occur with  improper specimen collection/handling, submission of specimen other than nasopharyngeal swab, presence of viral mutation(s) within the areas targeted by this assay, and inadequate number of viral copies (<131 copies/mL). A negative result must be combined with clinical observations, patient history, and epidemiological information. The expected result is Negative.  Fact Sheet for Patients:  https://www.moore.com/  Fact Sheet for Healthcare Providers:    https://www.young.biz/  This test is no t yet approved or cleared by the [place] FDA and  has been authorized for detection and/or diagnosis of SARS-CoV-2 by FDA under an Emergency Use Authorization (EUA). This EUA will remain  in effect (meaning this test can be used) for the duration of the COVID-19 declaration under Section 564(b)(1) of the Act, 21 U.S.C. section 360bbb-3(b)(1), unless the authorization is terminated or revoked sooner.     Influenza A by PCR NEGATIVE NEGATIVE Final   Influenza B by PCR NEGATIVE NEGATIVE Final    Comment: (NOTE) The Xpert Xpress SARS-CoV-2/FLU/RSV assay is intended as an aid in  the diagnosis of influenza from Nasopharyngeal swab specimens and  should not be used as a sole basis for treatment. Nasal washings and  aspirates are unacceptable for Xpert Xpress SARS-CoV-2/FLU/RSV  testing.  Fact Sheet for Patients: https://www.moore.com/  Fact Sheet for Healthcare Providers: https://www.young.biz/  This test is not yet approved or cleared by the [place] FDA and  has been authorized for detection and/or diagnosis of SARS-CoV-2 by  FDA under an Emergency Use Authorization (EUA). This EUA will remain  in effect (meaning this test can be used) for the duration of the  Covid-19 declaration under Section 564(b)(1) of the Act, 21  U.S.C. section 360bbb-3(b)(1), unless the authorization is  terminated or revoked. Performed at [facility], [address] [place], [place] [address]   Aerobic/Anaerobic Culture (surgical/deep wound)     Status: None (Preliminary result)   Collection Time: 01/11/20 12:44 PM   Specimen: PATH Other; Tissue  Result Value Ref Range Status   Specimen Description   Final    WOUND Performed at [facility], [address] [place], [place] [address]    Special Requests   Final    LEFT FOOT Performed at [facility], [address] [place], [place] [address]    Gram Stain NO WBC SEEN NO ORGANISMS SEEN   Final   Culture   Final    CULTURE REINCUBATED FOR BETTER GROWTH Performed at [facility], [address], [place], [place] [address]    Report Status PENDING  Incomplete     RN Pressure Injury Documentation:     Estimated body mass index is 51.65 kg/m as calculated from the following:   Height as of this encounter: 5\' 6"  (1.676 m).   Weight as of this encounter: 145.2 kg.  Malnutrition Type:      Malnutrition Characteristics:      Nutrition Interventions:    Radiology Studies: No results found. Scheduled Meds:  ARIPiprazole  5 mg Oral Daily   ascorbic acid  500 mg Oral Daily   atorvastatin  80 mg Oral Daily   budesonide  0.5 mg Nebulization BID   busPIRone  10 mg  Oral BID   calcium citrate-vitamin D  0.5 tablet Oral Daily   cholecalciferol  5,000 Units Oral Daily   famotidine  20 mg Oral Daily   FLUoxetine  20 mg Oral Daily   folic acid  1 mg Oral Daily   insulin aspart  0-9 Units Subcutaneous TID PC & HS   insulin glargine  22 Units Subcutaneous BID   ipratropium-albuterol  3 mL Nebulization BID   levothyroxine  88 mcg Oral QAC breakfast   mupirocin ointment  1 application Topical BID   potassium chloride SA  10 mEq Oral Daily   pramipexole  0.25 mg Oral QHS   pregabalin  150 mg Oral TID   QUEtiapine  300 mg Oral QHS   zolpidem  5 mg Oral QHS   Continuous Infusions:  cefTRIAXone (ROCEPHIN)  IV 2 g (01/12/20 1053)   vancomycin 1,500 mg (01/12/20 0405)    LOS: 2 days   Merlene Laughter, DO [facility] PAGER is on AMION  If 7PM-7AM, please contact night-coverage [email or web address]

## 2020-01-13 LAB — GLUCOSE, CAPILLARY
Glucose-Capillary: 120 mg/dL — ABNORMAL HIGH (ref 70–99)
Glucose-Capillary: 131 mg/dL — ABNORMAL HIGH (ref 70–99)
Glucose-Capillary: 111 mg/dL — ABNORMAL HIGH (ref 70–99)
Glucose-Capillary: 146 mg/dL — ABNORMAL HIGH (ref 70–99)
Glucose-Capillary: 140 mg/dL — ABNORMAL HIGH (ref 70–99)

## 2020-01-13 LAB — COMPREHENSIVE METABOLIC PANEL
ALT: 14 U/L (ref 0–44)
AST: 14 U/L — ABNORMAL LOW (ref 15–41)
Albumin: 2.5 g/dL — ABNORMAL LOW (ref 3.5–5.0)
Alkaline Phosphatase: 71 U/L (ref 38–126)
Anion gap: 8 (ref 5–15)
BUN: 12 mg/dL (ref 6–20)
CO2: 24 mmol/L (ref 22–32)
Calcium: 8.8 mg/dL — ABNORMAL LOW (ref 8.9–10.3)
Chloride: 109 mmol/L (ref 98–111)
Creatinine, Ser: 0.74 mg/dL (ref 0.44–1.00)
GFR calc Af Amer: >60 mL/min (ref >60)
GFR calc non Af Amer: >60 mL/min (ref >60)
Glucose, Bld: 131 mg/dL — ABNORMAL HIGH (ref 70–99)
Potassium: 3.9 mmol/L (ref 3.5–5.1)
Sodium: 141 mmol/L (ref 135–145)
Total Bilirubin: 0.5 mg/dL (ref 0.3–1.2)
Total Protein: 6.6 g/dL (ref 6.5–8.1)

## 2020-01-13 LAB — CBC WITH DIFFERENTIAL/PLATELET
Abs Immature Granulocytes: 0.12 10*3/uL — ABNORMAL HIGH (ref 0.00–0.07)
Basophils Absolute: 0.1 10*3/uL (ref 0.0–0.1)
Basophils Relative: 1 %
Eosinophils Absolute: 0 10*3/uL (ref 0.0–0.5)
Eosinophils Relative: 0 %
HCT: 27.1 % — ABNORMAL LOW (ref 36.0–46.0)
Hemoglobin: 8.2 g/dL — ABNORMAL LOW (ref 12.0–15.0)
Immature Granulocytes: 1 %
Lymphocytes Relative: 17 %
Lymphs Abs: 1.4 10*3/uL (ref 0.7–4.0)
MCH: 21.7 pg — ABNORMAL LOW (ref 26.0–34.0)
MCHC: 30.3 g/dL (ref 30.0–36.0)
MCV: 71.7 fL — ABNORMAL LOW (ref 80.0–100.0)
Monocytes Absolute: 0.7 10*3/uL (ref 0.1–1.0)
Monocytes Relative: 8 %
Neutro Abs: 6.1 10*3/uL (ref 1.7–7.7)
Neutrophils Relative %: 73 %
Platelets: 302 10*3/uL (ref 150–400)
RBC: 3.78 MIL/uL — ABNORMAL LOW (ref 3.87–5.11)
RDW: 19.6 % — ABNORMAL HIGH (ref 11.5–15.5)
WBC: 8.4 10*3/uL (ref 4.0–10.5)
nRBC: 0 % (ref 0.0–0.2)

## 2020-01-13 LAB — MAGNESIUM: Magnesium: 1.6 mg/dL — ABNORMAL LOW (ref 1.7–2.4)

## 2020-01-13 LAB — PHOSPHORUS: Phosphorus: 4.6 mg/dL (ref 2.5–4.6)

## 2020-01-13 MED ORDER — MAGNESIUM SULFATE 2 GM/50ML IV SOLN
2.0000 g | Freq: Once | INTRAVENOUS | Status: AC
  Administered 2020-01-13: 2 g via INTRAVENOUS
  Filled 2020-01-13: qty 50

## 2020-01-13 NOTE — Progress Notes (Signed)
Daily Progress Note   Subjective  - 2 Days Post-Op  F/u left foot I & D.  Doing well.  Objective Vitals:   01/12/20 1706 01/12/20 2350 01/13/20 0706 01/13/20 0801  BP: 110/65 115/62  (!) 124/58  Pulse: 85 82  81  Resp: 18 19  18   Temp: 99 F (37.2 C) 99.6 F (37.6 C)  98 F (36.7 C)  TempSrc: Oral Oral  Oral  SpO2: 98% 97% 90% 98%  Weight:      Height:        Physical Exam: Wound stable. No purulence.  Erythema is markedly improved.  Laboratory CBC    Component Value Date/Time   WBC 8.4 01/13/2020 0500   HGB 8.2 (L) 01/13/2020 0500   HGB 12.1 05/27/2014 1439   HCT 27.1 (L) 01/13/2020 0500   HCT 37.5 05/27/2014 1439   PLT 302 01/13/2020 0500   PLT 221 05/27/2014 1439    BMET    Component Value Date/Time   NA 141 01/13/2020 0500   NA 134 11/16/2018 1539   NA 136 05/27/2014 1439   K 3.9 01/13/2020 0500   K 3.8 05/27/2014 1439   CL 109 01/13/2020 0500   CL 100 05/27/2014 1439   CO2 24 01/13/2020 0500   CO2 28 05/27/2014 1439   GLUCOSE 131 (H) 01/13/2020 0500   GLUCOSE 270 (H) 05/27/2014 1439   BUN 12 01/13/2020 0500   BUN 13 11/16/2018 1539   BUN 6 (L) 05/27/2014 1439   CREATININE 0.74 01/13/2020 0500   CREATININE 0.83 05/27/2014 1439   CALCIUM 8.8 (L) 01/13/2020 0500   CALCIUM 8.5 05/27/2014 1439   GFRNONAA >60 01/13/2020 0500   GFRNONAA >60 05/27/2014 1439   GFRNONAA >60 12/03/2013 0511   GFRAA >60 01/13/2020 0500   GFRAA >60 05/27/2014 1439   GFRAA >60 12/03/2013 0511    Assessment/Planning: S/P I & D with sesamoid excision.   Cellulitis improved.  WBC in normal.  Deep wound culture NG day 2.  Pt with hx of MRSA in past.  Has seen ID at [facility] as recently as Wednesday.  Can begin daily dressing changes while in house.  Pt prefers to f/u with her regular podiatrist upon d/c.  States she has appt on Wednesday.  Remain NWB.  Pt states she has a rolling knee scooter at home and has used in the past.  Will sign off for now.   OK to  d/c from podiatry standpoint.  Monday A  01/13/2020, 11:26 AM

## 2020-01-13 NOTE — Progress Notes (Signed)
PROGRESS NOTE    [name] [name]  TFT:[id number] DOB: Feb 03, 1961 DOA: 01/10/2020 PCP: [facility]   Brief Narrative:  The patient is a super morbidly obese Caucasian female with a past medical history significant for but not limited to anxiety, abdominal wall hernia, rheumatoid arthritis, history of C. difficile colitis, history of chronic diastolic heart failure, history of COVID-19 disease, depression, uncontrolled diabetes mellitus type 2, hypertension, irritable bowel syndrome, major depressive disorder, history of MRSA infection, history of panic attacks, as well as other comorbidities as well as sleep apnea who presented with leg pain and noticed worsening left lower extremity swelling erythema and tenderness that extended from her left foot to her upper leg.  She has a left plantar big toe ulcer that she stated was MRSA positive and admitted to having some chills but denied any fever.  She denies any nausea or vomiting.  She presented to the emergency room and found a blood pressure 99/55 and a pulse of 116 and labs revealed leukocytosis of 16.3 and lactic acid level is 1.9.  She was given IV fentanyl for the pain and given 1 L bolus of normal saline as well as IV vancomycin she is admitted for further evaluation.  **Interim History The patient was also given IV Azactam and this was changed to IV ceftriaxone.  Podiatry was consulted for further evaluation recommendations and they recommend x-rays of the foot.  It showed diffuse soft tissue swelling and soft tissue ulceration noted at the distal plantar aspect of the foot as well as an overlying thin linear metallic density noted at the level of the second and third metatarsals along the plantar aspect of the foot suggesting a foreign body.  Because the patient was unable to undergo an MRI of the foot given her bladder stimulator she had a foot CT scan done which showed soft tissue ulceration at the plantar aspect of the left foot underlying  the first metatarsal head as well as erosive changes along the lateral aspect of the first metatarsal head and within the lateral hallux sesamoid were suggestive of osteomyelitis which was acute on chronic.  Patient also had a small ill-defined fluid collection adjacent to the lateral aspect of the first metatarsal head within the first intermetatarsal space measuring approximately 1.2 cm concerning for an abscess.  Patient also had small first MTP joint effusion which may reflected septic arthritis and a 7 mm linear radiopaque foreign body at the plantar soft tissues of the forefoot in line with the third MTP joint.  Podiatric surgery is planning on taking the patient to the OR today for an I&D and possible bone excision and biopsy and this was done 01/11/2020.  01/12/20 The patient is Status post I&D of the abscess of the left webspace as well as excision of the fibular sesamoid bone of the left foot and is POD1. Cx's are pending and her dressing was changed today by podiatry. CBG's Continue to remain very well controlled and white blood cell count is trending down. Will discuss with ID about Abx Management and Duration. Podiatry recommending NWB to the Left Foot   01/13/2020 Patient feels well and happy that her foot is in no pain.  Magnesium was a little low so this is being repleted.  We will formally consult infectious diseases and await the bone culture prior to safe discharge disposition to know what targeted antibiotic therapy to provide the patient on.  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 -She wa admitted to a medical monitored bed. -She will be continued on IV antibiotic therapy with vancomycin especially given her MRSA positive infected left plantar great toe wound andgiven allergy toIV cefepimeand meropenem she was given IV Azactam however after further review she was tolerant of IV ceftriaxone and will de-escalate and continue IV vancomycin and continue IV ceftriaxone for now  -Foot x-rays at outside facility but then had them repeated here -Foot X-Ray showed "Diffuse severe soft tissue swelling. Soft tissue ulceration noted along the distal plantar aspect of the left foot. 2. Overlying thin linear metallic density noted at the level the distal second and third metatarsals along the plantar aspect of the foot suggesting a foreign body. 3.  No acute bony abnormality.  No bony erosions" -Because of this podiatry recommended obtaining MRI but is unable to be done given her bladder stimulator so CT scan of the foot was done without contrast -CT scan of the left foot without contrast showed "Soft tissue ulceration at the plantar aspect of the left foot underlying the first metatarsal head. Erosive changes along the lateral aspect of the first metatarsal head and within the lateral hallux sesamoid, which have partially corticated margins. Findings suggest sequela of osteomyelitis, likely acute on chronic. 2. Small ill-defined fluid collection adjacent to the lateral aspect of the first metatarsal head within the first intermetatarsal space measuring approximately 1.2 cm, concerning for an abscess. 3. Small first MTP joint effusion, which may reflect septic arthritis. 4. 7 mm linear radiopaque foreign body within the plantar soft tissues of the forefoot underlying the third MTP joint."  -WBC is improving and went from 16.3 -> 14.0 -> 11.4 -> 8.9 -> 8.4 -Lactic acid level was normal at 1.9 and  trended down to 1.1 and repeat is now 1.3; her procalcitonin level was 0.16 -Pain management will be provided and she has Oxycodone IR 5 mg po q6hprn.  Patient also has pregabalin 150 g p.o. 3 times daily -Warm compresses will be applied.  Recommending elevation of the extremity -IVF now stopped  -We will continue mupirocin ointment. -Wound care consult will be obtained. -Have consulted Podiatry Dr. Ether Griffins for further evaluation recommendations and will be obtaining ABIs -Underwent surgical intervention yesterday and had an I&D and sesamoid bone removal is postoperative day 2 -Surgical cultures 01/11/2020 showed no WBCs seen and no organisms he has but culture grew out rare Staph aureus with culture report still pending; has a history of MRSA in the past and has seen infectious diseases at [facility] -We will continue current antibiotics and discuss with infectious diseases; infectious diseases Dr. Elinor Parkinson recommended following up on culture results and sensitivities prior to making a decision for PICC line versus oral antibiotics; We will discussed with in-house infectious disease in the a.m. once they are back and formally consult them for antibiotic recommendations and duration -Podiatry recommending daily dressing changes and following up with her regular podiatrist at discharge; she has a appointment on Wednesday, 01/16/2020 -Podiatry still recommends nonweightbearing as tolerated  and using a rolling knee scooter at home and they have signed off the case.  Anxiety/Depression. -We will continue her alprazolam 0.5 mg p.o. twice daily as needed anxiety, fluoxetine 20 mg p.o. daily, buspirone 10 mg p.o. twice daily, quetiapine 300 g p.o. daily nightly, pregabalin 150 mg p.o. 3 times daily, and aripiprazole 5 mg p.o. daily  Uncontrolled Type 2 diabetes mellitus with peripheral neuropathy. -She will be placed on supplemental coverage with NovoLog and will continue her basal coverage.  Currently  getting 22 units of Lantus subcu twice daily as well as sensitive NovoLog sliding scale insulin AC -Metformin will be held off. -Hemoglobin A1c was 10.8 -We will continue Lyrica for her neuropathy -Blood sugars have been ranging from 146-183 on daily BMP/CMP's: CBGs ranging from 120-154  Hypothyroidism. -We will continue Levothyroxine 88 mcg p.o. daily and check TSH level.  Chronic Diastolic CHF -Currently not in exacerbation -Her Lasix is being held given her sepsis physiology and AKI and will reduce to 75 MLS per hour for another 12 more hours -Strict I's and O's and daily weights; Patient is +4.0749 Liters since admission -Continue to monitor extremely carefully and continue to monitor for signs and symptoms of volume overload  GERD -Continue with Famotidine 20 g p.o. daily  Hyperlipidemia -Continue with Atorvastatin 80 mg po Daily  AKI -Patient had a BUN/creatinine of 32/1.35 on presentation -This is now improving after IV fluid hydration and is now 12/0.74 -IV fluid hydration is now stopped -Avoid further nephrotoxic medications, contrast dyes, hypotension and renally adjust medications  -We will hold her Lasix for now -Continue monitor and trend renal function carefully and repeat CMP in a.m.  Rheumatoid Arthritis -No longer taking methotrexate or prednisone and her Abatacept infusions have been held for the last 4 to 6 weeks due to foot infection  Microcytic Anemia -Patient's hemoglobin/hematocrit was trending down slowly but now stable as Hgb/Hct is now 8.2/27.1; MCV is now 71.7 -Checked anemia panel showed an iron level of 17, U IBC of 250, TIBC of 267, saturation ratios of 6%, ferritin level of 72, folate level of 38.0, and vitamin B12 level of 211 -Continue to monitor for signs and symptoms of bleeding; currently no overt bleeding noted -Repeat CBC in a.m.  Hypomagnesemia -Patient's Mag Level this AM was 1.6 -Replete with IV Mag Sulfate 2 grams -Continue to  Monitor and Replete as Necessary -Repeat Mag Level in the AM  Super Morbid Obesity -Complicates her overall care and her overall prognosis -Estimated body mass index is 51.65 kg/m as calculated from the following:   Height as of this encounter:  (1.676 m).   Weight as of this encounter: 145.2 kg. -Weight Loss and Dietary Counseling given   DVT prophylaxis: SCDs for surgical procedure  Code Status: FULL CODE  Family Communication: No family present at bedside Disposition Plan: Pending further evaluation improvement of her issues and clearance by podiatry and will discuss with ID about antibiotic regimen  Status is: Inpatient  Remains inpatient appropriate because:Ongoing diagnostic testing needed not appropriate for outpatient work up, Unsafe d/c plan, IV treatments appropriate due to intensity of illness or inability to take PO and Inpatient level of care appropriate due to severity of illness   Dispo: The patient is from: Home              Anticipated d/c is to: TBD              Anticipated d/c date is: 2 days  Patient currently is not medically stable to d/c.  Consultants:   Podiatry   Will discuss with infectious diseases about antibiotic length induration   Procedures: ABI  I and D to be done and Possible Bone Excision and Biopsy by Dr. Ether Griffins  Antimicrobials:  Anti-infectives (From admission, onward)   Start     Dose/Rate Route Frequency Ordered Stop   01/11/20 1600  vancomycin (VANCOREADY) IVPB 1500 mg/300 mL        1,500 mg 150 mL/hr over 120 Minutes Intravenous Every 12 hours 01/11/20 0937     01/10/20 1830  cefTRIAXone (ROCEPHIN) 2 g in sodium chloride 0.9 % 100 mL IVPB        2 g 200 mL/hr over 30 Minutes Intravenous Daily 01/10/20 1730     01/10/20 1600  vancomycin (VANCOREADY) IVPB 1250 mg/250 mL  Status:  Discontinued        1,250 mg 166.7 mL/hr over 90 Minutes Intravenous Every 12 hours 01/10/20 0436 01/11/20 0937   01/10/20 0900   aztreonam (AZACTAM) 1 g in sodium chloride 0.9 % 100 mL IVPB  Status:  Discontinued        1 g 200 mL/hr over 30 Minutes Intravenous Every 8 hours 01/10/20 0824 01/10/20 1730   01/10/20 0830  vancomycin (VANCOCIN) IVPB 1000 mg/200 mL premix  Status:  Discontinued        1,000 mg 200 mL/hr over 60 Minutes Intravenous  Once 01/10/20 0824 01/10/20 0838   01/10/20 0830  vancomycin (VANCOCIN) IVPB 1000 mg/200 mL premix  Status:  Discontinued        1,000 mg 200 mL/hr over 60 Minutes Intravenous  Once 01/10/20 0827 01/10/20 0838   01/10/20 0445  vancomycin (VANCOREADY) IVPB 1500 mg/300 mL        1,500 mg 150 mL/hr over 120 Minutes Intravenous  Once 01/10/20 0433 01/10/20 0800   01/10/20 0345  vancomycin (VANCOCIN) IVPB 1000 mg/200 mL premix        1,000 mg 200 mL/hr over 60 Minutes Intravenous  Once 01/10/20 8295 01/10/20 0502        Subjective: Seen and examined at bedside and states that her foot has no pain in it today.  States that she slept well last night.  No chest pain, lightness or dizziness.  States her back pain is better.  Feels overall improved.  No other concerns or complaints at this time.  Objective: Vitals:   01/12/20 1706 01/12/20 2350 01/13/20 0706 01/13/20 0801  BP: 110/65 115/62  (!) 124/58  Pulse: 85 82  81  Resp: Temp: 99 F (37.2 C) 99.6 F (37.6 C)  98 F (36.7 C)  TempSrc: Oral Oral  Oral  SpO2: 98% 97% 90% 98%  Weight:      Height:        Intake/Output Summary (Last 24 hours) at 01/13/2020 1340 Last data filed at 01/13/2020 0300 Gross per 24 hour  Intake 584.25 ml  Output 900 ml  Net -315.75 ml   Filed Weights   01/09/20 2220  Weight: (!) 145.2 kg   Examination: Physical Exam:  Constitutional: WN/WD super morbidly obese Caucasian female currently in no acute distress appears calm and more comfortable. Eyes: Lids and conjunctivae normal, sclerae anicteric  ENMT: External Ears, Nose appear normal. Grossly normal hearing.  Neck:  Appears normal, supple, no cervical masses, normal ROM, no appreciable thyromegaly; no JVD Respiratory: Diminished to auscultation bilaterally with coarse breath sounds, no wheezing, rales, rhonchi or crackles.  Normal respiratory effort and patient is not tachypenic. No accessory muscle use.  Unlabored breathing Cardiovascular: RRR, no murmurs / rubs / gallops. S1 and S2 auscultated.  Left leg has some mild edema Abdomen: Soft, slightly tender on the right where her hernia is, distended secondary body habitus. Bowel sounds positive.  GU: Deferred. Musculoskeletal: No clubbing / cyanosis of digits/nails.  She has 2 toes missing on her right foot and her left foot is wrapped Skin: No rashes, lesions, ulcers on limited skin evaluation but left foot is wrapped.  Has a tattoo on her right foot. Neurologic: CN 2-12 grossly intact with no focal deficits. Romberg sign and cerebellar reflexes not assessed.  Psychiatric: Normal judgment and insight. Alert and oriented x 3. Normal mood and appropriate affect.   Data Reviewed: I have personally reviewed following labs and imaging studies  CBC: Recent Labs  Lab 01/10/20 0541 01/10/20 1023 01/11/20 0420 01/12/20 0518 01/13/20 0500  WBC 16.3* 14.0* 11.4* 8.9 8.4  NEUTROABS  --  11.2* 9.0* 6.9 6.1  HGB 9.8* 9.6* 8.3* 8.1* 8.2*  HCT 32.7* 31.5* 28.8* 28.1* 27.1*  MCV 71.6* 71.3* 73.1* 73.8* 71.7*  PLT 323 307 288 274 302   Basic Metabolic Panel: Recent Labs  Lab 01/10/20 0541 01/10/20 1023 01/11/20 0420 01/12/20 0518 01/13/20 0500  NA 138 135 138 140 141  K 4.2 3.8 3.7 3.8 3.9  CL 103 104 107 110 109  CO2 26 22 24 22 24   GLUCOSE 169* 183* 146* 144* 131*  BUN 32* 30* 22* 14 12  CREATININE 1.35* 1.00 0.79 0.68 0.74  CALCIUM 8.4* 8.4* 8.3* 8.5* 8.8*  MG  --   --  1.8 1.9 1.6*  PHOS  --   --  3.0 3.3 4.6   GFR: Estimated Creatinine Clearance: 113.4 mL/min (by C-G formula based on SCr of 0.74 mg/dL). Liver Function Tests: Recent Labs  Lab  01/10/20 1023 01/11/20 0420 01/12/20 0518 01/13/20 0500  AST 19 16 15  14*  ALT 17 16 14 14   ALKPHOS 70 63 69 71  BILITOT 1.0 0.6 0.6 0.5  PROT 7.2 6.7 6.5 6.6  ALBUMIN 3.0* 2.7* 2.6* 2.5*   No results for input(s): LIPASE, AMYLASE in the last 168 hours. No results for input(s): AMMONIA in the last 168 hours. Coagulation Profile: Recent Labs  Lab 01/10/20 0541 01/10/20 1023  INR 1.2 1.2   Cardiac Enzymes: No results for input(s): CKTOTAL, CKMB, CKMBINDEX, TROPONINI in the last 168 hours. BNP (last 3 results) No results for input(s): PROBNP in the last 8760 hours. HbA1C: No results for input(s): HGBA1C in the last 72 hours. CBG: Recent Labs  Lab 01/12/20 1144 01/12/20 1707 01/12/20 2131 01/13/20 0806 01/13/20 1151  GLUCAP 151* 142* 154* 120* 131*   Lipid Profile: No results for input(s): CHOL, HDL, LDLCALC, TRIG, CHOLHDL, LDLDIRECT in the last 72 hours. Thyroid Function Tests: No results for input(s): TSH, T4TOTAL, FREET4, T3FREE, THYROIDAB in the last 72 hours. Anemia Panel: Recent Labs    01/11/20 0420  VITAMINB12 211  FOLATE 38.0  FERRITIN 72  TIBC 267  IRON 17*  RETICCTPCT 2.2   Sepsis Labs: Recent Labs  Lab 01/10/20 0214 01/10/20 0354 01/10/20 0541 01/10/20 0832 01/10/20 1023  PROCALCITON  --   --   --   --  0.16  LATICACIDVEN 1.9 1.3 1.1 1.3  --     Recent Results (from the past 240 hour(s))  Blood culture (routine x 2)     Status: None (Preliminary  result)   Collection Time: 01/10/20  2:14 AM   Specimen: BLOOD  Result Value Ref Range Status   Specimen Description BLOOD RIGHT FA  Final   Special Requests   Final    BOTTLES DRAWN AEROBIC AND ANAEROBIC Blood Culture adequate volume   Culture   Final    NO GROWTH 3 DAYS Performed at [facility], [address], [place], [place] [address]    Report Status PENDING  Incomplete  Blood culture (routine x 2)     Status: None (Preliminary result)   Collection Time: 01/10/20  3:54 AM     Specimen: BLOOD  Result Value Ref Range Status   Specimen Description BLOOD RIGHT [facility]  Final   Special Requests   Final    BOTTLES DRAWN AEROBIC AND ANAEROBIC Blood Culture adequate volume   Culture   Final    NO GROWTH 3 DAYS Performed at [facility], [address], [place], [place] [address]    Report Status PENDING  Incomplete  Respiratory Panel by RT PCR (Flu A&B, Covid) - Nasopharyngeal Swab     Status: None   Collection Time: 01/10/20  8:32 AM   Specimen: Nasopharyngeal Swab  Result Value Ref Range Status   SARS Coronavirus 2 by RT PCR NEGATIVE NEGATIVE Final    Comment: (NOTE) SARS-CoV-2 target nucleic acids are NOT DETECTED.  The SARS-CoV-2 RNA is generally detectable in upper respiratoy specimens during the acute phase of infection. The lowest concentration of SARS-CoV-2 viral copies this assay can detect is 131 copies/mL. A negative result does not preclude SARS-Cov-2 infection and should not be used as the sole basis for treatment or other patient management decisions. A negative result may occur with  improper specimen collection/handling, submission of specimen other than nasopharyngeal swab, presence of viral mutation(s) within the areas targeted by this assay, and inadequate number of viral copies (<131 copies/mL). A negative result must be combined with clinical observations, patient history, and epidemiological information. The expected result is Negative.  Fact Sheet for Patients:  https://www.moore.com/  Fact Sheet for Healthcare Providers:  https://www.young.biz/  This test is no t yet approved or cleared by the [place] FDA and  has been authorized for detection and/or diagnosis of SARS-CoV-2 by FDA under an Emergency Use Authorization (EUA). This EUA will remain  in effect (meaning this test can be used) for the duration of the COVID-19 declaration under Section 564(b)(1) of the Act, 21  U.S.C. section 360bbb-3(b)(1), unless the authorization is terminated or revoked sooner.     Influenza A by PCR NEGATIVE NEGATIVE Final   Influenza B by PCR NEGATIVE NEGATIVE Final    Comment: (NOTE) The Xpert Xpress SARS-CoV-2/FLU/RSV assay is intended as an aid in  the diagnosis of influenza from Nasopharyngeal swab specimens and  should not be used as a sole basis for treatment. Nasal washings and  aspirates are unacceptable for Xpert Xpress SARS-CoV-2/FLU/RSV  testing.  Fact Sheet for Patients: https://www.moore.com/  Fact Sheet for Healthcare Providers: https://www.young.biz/  This test is not yet approved or cleared by the [place] FDA and  has been authorized for detection and/or diagnosis of SARS-CoV-2 by  FDA under an Emergency Use Authorization (EUA). This EUA will remain  in effect (meaning this test can be used) for the duration of the  Covid-19 declaration under Section 564(b)(1) of the Act, 21  U.S.C. section 360bbb-3(b)(1), unless the authorization is  terminated or revoked. Performed at [facility], [address] [place],  [place] [address]   Aerobic/Anaerobic Culture (surgical/deep wound)     Status: None (Preliminary result)   Collection Time: 01/11/20 12:44 PM   Specimen: PATH Other; Tissue  Result Value Ref Range Status   Specimen Description   Final    WOUND Performed at [facility], [address] [place], [place] [address]    Special Requests   Final    LEFT FOOT Performed at [facility], [address] [place], [place] [address]    Gram Stain   Final    NO WBC SEEN NO ORGANISMS SEEN Performed at [facility], [address], [place], [place] [address]    Culture RARE STAPHYLOCOCCUS AUREUS  Final   Report Status PENDING  Incomplete     RN Pressure Injury Documentation:     Estimated body mass index is 51.65 kg/m as calculated from the following:   Height  as of this encounter: 5\' 6"  (1.676 m).   Weight as of this encounter: 145.2 kg.  Malnutrition Type:      Malnutrition Characteristics:      Nutrition Interventions:   Radiology Studies: No results found. Scheduled Meds: . ARIPiprazole  5 mg Oral Daily  . ascorbic acid  500 mg Oral Daily  . atorvastatin  80 mg Oral Daily  . budesonide  0.5 mg Nebulization BID  . busPIRone  10 mg Oral BID  . calcium citrate-vitamin D  0.5 tablet Oral Daily  . cholecalciferol  5,000 Units Oral Daily  . famotidine  20 mg Oral Daily  . FLUoxetine  20 mg Oral Daily  . folic acid  1 mg Oral Daily  . insulin aspart  0-9 Units Subcutaneous TID PC & HS  . insulin glargine  22 Units Subcutaneous BID  . ipratropium-albuterol  3 mL Nebulization BID  . levothyroxine  88 mcg Oral QAC breakfast  . mupirocin ointment  1 application Topical BID  . potassium chloride SA  10 mEq Oral Daily  . pramipexole  0.25 mg Oral QHS  . pregabalin  150 mg Oral TID  . QUEtiapine  300 mg Oral QHS  . zolpidem  5 mg Oral QHS   Continuous Infusions: . cefTRIAXone (ROCEPHIN)  IV 2 g (01/13/20 1233)  . vancomycin 1,500 mg (01/13/20 0514)    LOS: 3 days   Merlene Laughter, DO [facility] PAGER is on AMION  If 7PM-7AM, please contact night-coverage [email or web address]

## 2020-01-13 NOTE — Plan of Care (Signed)

## 2020-01-14 ENCOUNTER — Encounter: Payer: Self-pay | Admitting: Podiatry

## 2020-01-14 DIAGNOSIS — E11628 Type 2 diabetes mellitus with other skin complications: Secondary | ICD-10-CM

## 2020-01-14 DIAGNOSIS — M069 Rheumatoid arthritis, unspecified: Secondary | ICD-10-CM

## 2020-01-14 LAB — CBC WITH DIFFERENTIAL/PLATELET
Abs Immature Granulocytes: 0.1 10*3/uL — ABNORMAL HIGH (ref 0.00–0.07)
Basophils Absolute: 0.1 10*3/uL (ref 0.0–0.1)
Basophils Relative: 1 %
Eosinophils Absolute: 0 10*3/uL (ref 0.0–0.5)
Eosinophils Relative: 0 %
HCT: 28.1 % — ABNORMAL LOW (ref 36.0–46.0)
Hemoglobin: 8.6 g/dL — ABNORMAL LOW (ref 12.0–15.0)
Immature Granulocytes: 1 %
Lymphocytes Relative: 12 %
Lymphs Abs: 1 10*3/uL (ref 0.7–4.0)
MCH: 21.4 pg — ABNORMAL LOW (ref 26.0–34.0)
MCHC: 30.6 g/dL (ref 30.0–36.0)
MCV: 70.1 fL — ABNORMAL LOW (ref 80.0–100.0)
Monocytes Absolute: 0.5 10*3/uL (ref 0.1–1.0)
Monocytes Relative: 6 %
Neutro Abs: 6.8 10*3/uL (ref 1.7–7.7)
Neutrophils Relative %: 80 %
Platelets: 302 10*3/uL (ref 150–400)
RBC: 4.01 MIL/uL (ref 3.87–5.11)
RDW: 19.5 % — ABNORMAL HIGH (ref 11.5–15.5)
WBC: 8.5 10*3/uL (ref 4.0–10.5)
nRBC: 0 % (ref 0.0–0.2)

## 2020-01-14 LAB — COMPREHENSIVE METABOLIC PANEL
ALT: 15 U/L (ref 0–44)
AST: 17 U/L (ref 15–41)
Albumin: 2.6 g/dL — ABNORMAL LOW (ref 3.5–5.0)
Alkaline Phosphatase: 76 U/L (ref 38–126)
Anion gap: 10 (ref 5–15)
BUN: 12 mg/dL (ref 6–20)
CO2: 23 mmol/L (ref 22–32)
Calcium: 9.1 mg/dL (ref 8.9–10.3)
Chloride: 107 mmol/L (ref 98–111)
Creatinine, Ser: 0.72 mg/dL (ref 0.44–1.00)
GFR calc Af Amer: >60 mL/min (ref >60)
GFR calc non Af Amer: >60 mL/min (ref >60)
Glucose, Bld: 172 mg/dL — ABNORMAL HIGH (ref 70–99)
Potassium: 3.6 mmol/L (ref 3.5–5.1)
Sodium: 140 mmol/L (ref 135–145)
Total Bilirubin: 0.5 mg/dL (ref 0.3–1.2)
Total Protein: 6.7 g/dL (ref 6.5–8.1)

## 2020-01-14 LAB — MRSA PCR SCREENING: MRSA by PCR: POSITIVE — AB

## 2020-01-14 LAB — VANCOMYCIN, TROUGH: Vancomycin Tr: 18 ug/mL (ref 15–20)

## 2020-01-14 LAB — MAGNESIUM: Magnesium: 1.5 mg/dL — ABNORMAL LOW (ref 1.7–2.4)

## 2020-01-14 LAB — GLUCOSE, CAPILLARY
Glucose-Capillary: 163 mg/dL — ABNORMAL HIGH (ref 70–99)
Glucose-Capillary: 149 mg/dL — ABNORMAL HIGH (ref 70–99)
Glucose-Capillary: 180 mg/dL — ABNORMAL HIGH (ref 70–99)
Glucose-Capillary: 152 mg/dL — ABNORMAL HIGH (ref 70–99)

## 2020-01-14 LAB — PHOSPHORUS: Phosphorus: 4.3 mg/dL (ref 2.5–4.6)

## 2020-01-14 MED ORDER — ACETAMINOPHEN 325 MG PO TABS: 650.0000 mg | ORAL_TABLET | Freq: Four times a day (QID) | ORAL | Status: DC | PRN

## 2020-01-14 MED ORDER — SODIUM CHLORIDE 0.9 % IV BOLUS
500.0000 mL | Freq: Once | INTRAVENOUS | Status: AC
  Administered 2020-01-14: 500 mL via INTRAVENOUS

## 2020-01-14 MED ORDER — MAGNESIUM SULFATE 50 % IJ SOLN
3.0000 g | Freq: Once | INTRAVENOUS | Status: AC
  Administered 2020-01-14: 3 g via INTRAVENOUS
  Filled 2020-01-14: qty 6

## 2020-01-14 MED ORDER — ACETAMINOPHEN 650 MG RE SUPP: 650.0000 mg | Freq: Four times a day (QID) | RECTAL | Status: DC | PRN

## 2020-01-14 NOTE — Progress Notes (Signed)
PROGRESS NOTE    [name] [name]  RUE:[id number] DOB: 22-Mar-1961 DOA: 01/10/2020 PCP: [facility]   Brief Narrative:  The patient is a super morbidly obese Caucasian female with a past medical history significant for but not limited to anxiety, abdominal wall hernia, rheumatoid arthritis, history of C. difficile colitis, history of chronic diastolic heart failure, history of COVID-19 disease, depression, uncontrolled diabetes mellitus type 2, hypertension, irritable bowel syndrome, major depressive disorder, history of MRSA infection, history of panic attacks, as well as other comorbidities as well as sleep apnea who presented with leg pain and noticed worsening left lower extremity swelling erythema and tenderness that extended from her left foot to her upper leg.  She has a left plantar big toe ulcer that she stated was MRSA positive and admitted to having some chills but denied any fever.  She denies any nausea or vomiting.  She presented to the emergency room and found a blood pressure 99/55 and a pulse of 116 and labs revealed leukocytosis of 16.3 and lactic acid level is 1.9.  She was given IV fentanyl for the pain and given 1 L bolus of normal saline as well as IV vancomycin she is admitted for further evaluation.  **Interim History The patient was also given IV Azactam and this was changed to IV ceftriaxone.  Podiatry was consulted for further evaluation recommendations and they recommend x-rays of the foot.  It showed diffuse soft tissue swelling and soft tissue ulceration noted at the distal plantar aspect of the foot as well as an overlying thin linear metallic density noted at the level of the second and third metatarsals along the plantar aspect of the foot suggesting a foreign body.  Because the patient was unable to undergo an MRI of the foot given her bladder stimulator she had a foot CT scan done which showed soft tissue ulceration at the plantar aspect of the left foot underlying  the first metatarsal head as well as erosive changes along the lateral aspect of the first metatarsal head and within the lateral hallux sesamoid were suggestive of osteomyelitis which was acute on chronic.  Patient also had a small ill-defined fluid collection adjacent to the lateral aspect of the first metatarsal head within the first intermetatarsal space measuring approximately 1.2 cm concerning for an abscess.  Patient also had small first MTP joint effusion which may reflected septic arthritis and a 7 mm linear radiopaque foreign body at the plantar soft tissues of the forefoot in line with the third MTP joint.  Podiatric surgery is planning on taking the patient to the OR today for an I&D and possible bone excision and biopsy and this was done 01/11/2020.  01/12/20 The patient is Status post I&D of the abscess of the left webspace as well as excision of the fibular sesamoid bone of the left foot and is POD1. Cx's are pending and her dressing was changed today by podiatry. CBG's Continue to remain very well controlled and white blood cell count is trending down. Will discuss with ID about Abx Management and Duration. Podiatry recommending NWB to the Left Foot   01/13/2020 Patient feels well and happy that her foot is in no pain.  Magnesium was a little low so this is being repleted.  We will formally consult infectious diseases and await the bone culture prior to safe discharge disposition to know what targeted antibiotic therapy to provide the patient on.  She may or may not need a PICC but will have infectious  disease formally evaluate  01/14/2020 He feels a little sleepy and did not sleep very well last night.  ID was formally consulted for further evaluation recommendations and cultures are growing out rare methicillin-resistant staph aureus as well as group B strep.  We will have ID formally adjust antibiotics and decide on the length of treatment..  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 -She wa admitted to a medical monitored bed. -She will be continued on IV antibiotic therapy with vancomycin especially given her MRSA positive infected left plantar great toe wound andgiven allergy toIV cefepimeand meropenem she was given IV Azactam however after further review she was tolerant of IV ceftriaxone and will de-escalate and continue IV vancomycin and continue IV ceftriaxone for now  -Foot x-rays at outside facility but then had them repeated here -Foot X-Ray showed "Diffuse severe soft tissue swelling. Soft tissue ulceration noted along the distal plantar aspect of the left foot. 2. Overlying thin linear metallic density noted at the level the distal second and third metatarsals along the plantar aspect of the foot suggesting a foreign body. 3.  No acute bony abnormality.  No bony erosions" -Because of this podiatry recommended obtaining MRI but is unable to be done given her bladder stimulator so CT scan of the foot was done without contrast -CT scan of the left foot without contrast showed "Soft tissue ulceration at the plantar aspect of the left foot underlying the first metatarsal head. Erosive changes along the lateral aspect of the first metatarsal head and within the lateral hallux sesamoid, which have partially corticated margins. Findings suggest sequela of osteomyelitis, likely acute on chronic. 2. Small ill-defined fluid collection adjacent to the lateral aspect of the first metatarsal head within the first intermetatarsal space measuring approximately 1.2 cm,  concerning for an abscess. 3. Small first MTP joint effusion, which may reflect septic arthritis. 4. 7 mm linear radiopaque foreign body within the plantar soft tissues of the forefoot underlying the third MTP joint."  -WBC is improving and went from 16.3 -> 14.0 -> 11.4 -> 8.9 -> 8.4 -> 8.5 -Lactic acid level was normal at 1.9 and trended down to 1.1 and repeat is now 1.3; her procalcitonin level was 0.16 -Pain management will be provided and she has Oxycodone IR 5 mg po q6hprn.  Patient also has pregabalin 150 g p.o. 3 times daily -Warm compresses will be applied.  Recommending elevation of the extremity -IVF now stopped  -We will continue mupirocin ointment. -Wound care consult will be obtained. -Have consulted Podiatry Dr. Ether Griffins for further evaluation recommendations and will be obtaining ABIs -Underwent surgical intervention yesterday and had an I&D and sesamoid bone removal is postoperative day 3 -Surgical cultures 01/11/2020 showed no WBCs seen and no organisms he has but culture grew out rare Methicillin Resistant Staph aureus and  Rare Group B Strep with culture reports showing that the MRSA is only sensitive to gentamicin, inducible clindamycin, rifampin, tetracycline, Bactrim and vancomycin; has a history of MRSA in the past and has seen infectious diseases at [facility] -We will continue current antibiotics and  discuss with infectious diseases; infectious diseases Dr. Elinor Parkinson recommended following up on culture results and sensitivities prior to making a decision for PICC line versus oral antibiotics; will formally consulted Dr. Evaluation care for further evaluation recommendations once they are back and formally consult them for antibiotic recommendations and duration -Podiatry recommending daily dressing changes and following up with her regular podiatrist at discharge; she has a appointment on Wednesday, 01/16/2020 -Podiatry still recommends nonweightbearing as tolerated and using a  rolling knee scooter at home and they have signed off the case.  Anxiety/Depression. -We will continue her alprazolam 0.5 mg p.o. twice daily as needed anxiety, fluoxetine 20 mg p.o. daily, buspirone 10 mg p.o. twice daily, quetiapine 300 g p.o. daily nightly, pregabalin 150 mg p.o. 3 times daily, and aripiprazole 5 mg p.o. daily  Uncontrolled Type 2 diabetes mellitus with Peripheral Neuropathy. -She will be placed on supplemental coverage with NovoLog and will continue her basal coverage.  Currently getting 22 units of Lantus subcu twice daily as well as sensitive NovoLog sliding scale insulin AC -Metformin will be held off. -Hemoglobin A1c was 10.8 -We will continue Lyrica for her neuropathy -Blood sugars have been ranging from 146-183 on daily BMP/CMP's: CBGs ranging from 131-163  Hypothyroidism. -We will continue Levothyroxine 88 mcg p.o. daily and check TSH level in the a.m.  Chronic Diastolic CHF -Currently not in exacerbation -Her Lasix is being held given her sepsis physiology and AKI and IVF now stopped -Strict I's and O's and daily weights; Patient is +4.2749 Liters since admission -Continue to monitor extremely carefully and continue to monitor for signs and symptoms of volume overload; he does not appear volume overloaded  GERD -Continue with Famotidine 20 g p.o. daily  Hyperlipidemia -Continue with Atorvastatin 80 mg po Daily  Chronic Respiratory Failure with Hypoxia -Patient wears 2 L as needed at home and was on 2 L this morning but likely can be weaned off easily -Likely in setting of obesity hypoventilation syndrome and suspected OSA -SpO2: 98 % O2 Flow Rate (L/min): 2 L/min -Continue monitor her oxygen saturations and maintain O2 saturation 90%  AKI -Patient had a BUN/creatinine of 32/1.35 on presentation -This is now improving after IV fluid hydration and is now 12/0.72 -IV fluid hydration is now stopped -Avoid further nephrotoxic medications, contrast  dyes, hypotension and renally adjust medications  -We will hold her Lasix for now -Continue monitor and trend renal function carefully and repeat CMP in a.m.  Rheumatoid Arthritis -No longer taking methotrexate or prednisone and her Abatacept infusions have been held for the last 4 to 6 weeks due to foot infection  Microcytic Anemia -Patient's hemoglobin/hematocrit was trending down slowly but now stable as Hgb/Hct is now 8.6/20.1; MCV is now 70.1 -Checked anemia panel showed an iron level of 17, U IBC of 250, TIBC of 267, saturation ratios of 6%, ferritin level of 72, folate level of 38.0, and vitamin B12 level of 211 -Continue to monitor for signs and symptoms of bleeding; currently no overt bleeding noted -Repeat CBC in a.m.  Hypomagnesemia -Patient's Mag Level this AM was 1.5 -Replete with IV Mag Sulfate 3 grams today -Continue to Monitor and Replete as Necessary -Repeat Mag Level in the AM  Super Morbid Obesity -Complicates her overall care and her overall prognosis -Estimated body mass index is 51.65 kg/m as calculated from the following:   Height as of this encounter: 5\' 6"  (1.676 m).   Weight as of this encounter: 145.2 kg. -Weight Loss and Dietary Counseling  given   DVT prophylaxis: SCDs for surgical procedure  Code Status: FULL CODE  Family Communication: No family present at bedside Disposition Plan: Pending further evaluation improvement of her issues and clearance by podiatry and will discuss with ID about antibiotic regimen  Status is: Inpatient  Remains inpatient appropriate because:Ongoing diagnostic testing needed not appropriate for outpatient work up, Unsafe d/c plan, IV treatments appropriate due to intensity of illness or inability to take PO and Inpatient level of care appropriate due to severity of illness   Dispo: The patient is from: Home              Anticipated d/c is to: TBD              Anticipated d/c date is: 1-2 days              Patient  currently is not medically stable to d/c.  Consultants:   Podiatry Dr. Eulis Canner   Infectious Diseases Dr. Rivka Safer   Procedures: ABI Procedure by Dr. Ether Griffins on 01/11/2020: 1.  I&D abscess left webspace 2.  Excision fibular sesamoid left foot  Antimicrobials:  Anti-infectives (From admission, onward)   Start     Dose/Rate Route Frequency Ordered Stop   01/11/20 1600  vancomycin (VANCOREADY) IVPB 1500 mg/300 mL        1,500 mg 150 mL/hr over 120 Minutes Intravenous Every 12 hours 01/11/20 0937     01/10/20 1830  cefTRIAXone (ROCEPHIN) 2 g in sodium chloride 0.9 % 100 mL IVPB        2 g 200 mL/hr over 30 Minutes Intravenous Daily 01/10/20 1730     01/10/20 1600  vancomycin (VANCOREADY) IVPB 1250 mg/250 mL  Status:  Discontinued        1,250 mg 166.7 mL/hr over 90 Minutes Intravenous Every 12 hours 01/10/20 0436 01/11/20 0937   01/10/20 0900  aztreonam (AZACTAM) 1 g in sodium chloride 0.9 % 100 mL IVPB  Status:  Discontinued        1 g 200 mL/hr over 30 Minutes Intravenous Every 8 hours 01/10/20 0824 01/10/20 1730   01/10/20 0830  vancomycin (VANCOCIN) IVPB 1000 mg/200 mL premix  Status:  Discontinued        1,000 mg 200 mL/hr over 60 Minutes Intravenous  Once 01/10/20 0824 01/10/20 0838   01/10/20 0830  vancomycin (VANCOCIN) IVPB 1000 mg/200 mL premix  Status:  Discontinued        1,000 mg 200 mL/hr over 60 Minutes Intravenous  Once 01/10/20 0827 01/10/20 0838   01/10/20 0445  vancomycin (VANCOREADY) IVPB 1500 mg/300 mL        1,500 mg 150 mL/hr over 120 Minutes Intravenous  Once 01/10/20 0433 01/10/20 0800   01/10/20 0345  vancomycin (VANCOCIN) IVPB 1000 mg/200 mL premix        1,000 mg 200 mL/hr over 60 Minutes Intravenous  Once 01/10/20 0338 01/10/20 0502        Subjective: Seen and examined at bedside and states that she did not sleep very well last night and was very sleepy this morning.  States that the foot pain is doing a little bit better.  No chest pain,  lightheadedness or dizziness.  No nausea or vomiting.  Denies any other concerns or plans at this time and understands that infectious disease will be by later today to see her.  Objective: Vitals:   01/13/20 1746 01/14/20 0744 01/14/20 0807 01/14/20 0820  BP: (!) 113/52 (!) 89/60 121/64   Pulse:  85 85 88   Resp: 20 18 17    Temp: 98.8 F (37.1 C) 99.4 F (37.4 C) 98.8 F (37.1 C)   TempSrc: Oral Oral Oral   SpO2: 99% 97% 97% 98%  Weight:      Height:        Intake/Output Summary (Last 24 hours) at 01/14/2020 1216 Last data filed at 01/14/2020 0300 Gross per 24 hour  Intake 1000 ml  Output 800 ml  Net 200 ml   Filed Weights   01/09/20 2220  Weight: (!) 145.2 kg   Examination: Physical Exam:  Constitutional: WN/WD super morbidly obese Caucasian female currently in no acute distress appears a little drowsy and somnolent but is calm and comfortable. Eyes: Lids and conjunctivae normal, sclerae anicteric  ENMT: External Ears, Nose appear normal. Grossly normal hearing.  Neck: Appears normal, supple, no cervical masses, normal ROM, no appreciable thyromegaly; JVD is a little difficult to assess given her body habitus Respiratory: Mildly diminished to auscultation bilaterally, no wheezing, rales, rhonchi o wearing 2 L supplemental oxygen via nasal cannula r crackles. Normal respiratory effort and patient is not tachypenic. No accessory muscle use.  Cardiovascular: RRR, no murmurs / rubs / gallops. S1 and S2 auscultated.  Has some lower extremity edema on the left side compared to right and some chronic venous stasis changes Abdomen: Soft, minimally tender, distended secondary body habitus.  Has a hernia on the right side.  Bowel sounds positive.  GU: Deferred. Musculoskeletal: No clubbing / cyanosis of digits/nails.  Is the first and second toe missing on the right leg and a tattoo on the right foot.  Left leg and foot is wrapped. Skin: No rashes, lesions, ulcers on limited skin  evaluation but lower right extremity was slightly erythematous today and left leg is mildly erythematous and warm. No induration; Warm and dry.  Neurologic: CN 2-12 grossly intact with no focal deficits. Romberg sign and cerebellar reflexes not assessed.  Psychiatric: Normal judgment and insight.  Patient is slightly somnolent and drowsy but she is oriented x 3. Normal mood and appropriate affect.   Data Reviewed: I have personally reviewed following labs and imaging studies  CBC: Recent Labs  Lab 01/10/20 1023 01/11/20 0420 01/12/20 0518 01/13/20 0500 01/14/20 0453  WBC 14.0* 11.4* 8.9 8.4 8.5  NEUTROABS 11.2* 9.0* 6.9 6.1 6.8  HGB 9.6* 8.3* 8.1* 8.2* 8.6*  HCT 31.5* 28.8* 28.1* 27.1* 28.1*  MCV 71.3* 73.1* 73.8* 71.7* 70.1*  PLT 307 288 274 302 302   Basic Metabolic Panel: Recent Labs  Lab 01/10/20 1023 01/11/20 0420 01/12/20 0518 01/13/20 0500 01/14/20 0453  NA 135 138 140 141 140  K 3.8 3.7 3.8 3.9 3.6  CL 104 107 110 109 107  CO2 22 24 22 24 23   GLUCOSE 183* 146* 144* 131* 172*  BUN 30* 22* 14 12 12   CREATININE 1.00 0.79 0.68 0.74 0.72  CALCIUM 8.4* 8.3* 8.5* 8.8* 9.1  MG  --  1.8 1.9 1.6* 1.5*  PHOS  --  3.0 3.3 4.6 4.3   GFR: Estimated Creatinine Clearance: 113.4 mL/min (by C-G formula based on SCr of 0.72 mg/dL). Liver Function Tests: Recent Labs  Lab 01/10/20 1023 01/11/20 0420 01/12/20 0518 01/13/20 0500 01/14/20 0453  AST 19 16 15  14* 17  ALT 17 16 14 14 15   ALKPHOS 70 63 69 71 76  BILITOT 1.0 0.6 0.6 0.5 0.5  PROT 7.2 6.7 6.5 6.6 6.7  ALBUMIN 3.0* 2.7* 2.6* 2.5* 2.6*  No results for input(s): LIPASE, AMYLASE in the last 168 hours. No results for input(s): AMMONIA in the last 168 hours. Coagulation Profile: Recent Labs  Lab 01/10/20 0541 01/10/20 1023  INR 1.2 1.2   Cardiac Enzymes: No results for input(s): CKTOTAL, CKMB, CKMBINDEX, TROPONINI in the last 168 hours. BNP (last 3 results) No results for input(s): PROBNP in the last 8760  hours. HbA1C: No results for input(s): HGBA1C in the last 72 hours. CBG: Recent Labs  Lab 01/13/20 1151 01/13/20 1630 01/13/20 2137 01/13/20 2321 01/14/20 0745  GLUCAP 131* 111* 146* 140* 163*   Lipid Profile: No results for input(s): CHOL, HDL, LDLCALC, TRIG, CHOLHDL, LDLDIRECT in the last 72 hours. Thyroid Function Tests: No results for input(s): TSH, T4TOTAL, FREET4, T3FREE, THYROIDAB in the last 72 hours. Anemia Panel: No results for input(s): VITAMINB12, FOLATE, FERRITIN, TIBC, IRON, RETICCTPCT in the last 72 hours. Sepsis Labs: Recent Labs  Lab 01/10/20 0214 01/10/20 0354 01/10/20 0541 01/10/20 0832 01/10/20 1023  PROCALCITON  --   --   --   --  0.16  LATICACIDVEN 1.9 1.3 1.1 1.3  --     Recent Results (from the past 240 hour(s))  Blood culture (routine x 2)     Status: None (Preliminary result)   Collection Time: 01/10/20  2:14 AM   Specimen: BLOOD  Result Value Ref Range Status   Specimen Description BLOOD RIGHT FA  Final   Special Requests   Final    BOTTLES DRAWN AEROBIC AND ANAEROBIC Blood Culture adequate volume   Culture   Final    NO GROWTH 4 DAYS Performed at [facility], [address] [place], [place] [address]    Report Status PENDING  Incomplete  Blood culture (routine x 2)     Status: None (Preliminary result)   Collection Time: 01/10/20  3:54 AM   Specimen: BLOOD  Result Value Ref Range Status   Specimen Description BLOOD RIGHT [facility]  Final   Special Requests   Final    BOTTLES DRAWN AEROBIC AND ANAEROBIC Blood Culture adequate volume   Culture   Final    NO GROWTH 4 DAYS Performed at [facility], [address] [place], [place] [address]    Report Status PENDING  Incomplete  Respiratory Panel by RT PCR (Flu A&B, Covid) - Nasopharyngeal Swab     Status: None   Collection Time: 01/10/20  8:32 AM   Specimen: Nasopharyngeal Swab  Result Value Ref Range Status   SARS Coronavirus 2 by RT PCR NEGATIVE NEGATIVE Final     Comment: (NOTE) SARS-CoV-2 target nucleic acids are NOT DETECTED.  The SARS-CoV-2 RNA is generally detectable in upper respiratoy specimens during the acute phase of infection. The lowest concentration of SARS-CoV-2 viral copies this assay can detect is 131 copies/mL. A negative result does not preclude SARS-Cov-2 infection and should not be used as the sole basis for treatment or other patient management decisions. A negative result may occur with  improper specimen collection/handling, submission of specimen other than nasopharyngeal swab, presence of viral mutation(s) within the areas targeted by this assay, and inadequate number of viral copies (<131 copies/mL). A negative result must be combined with clinical observations, patient history, and epidemiological information. The expected result is Negative.  Fact Sheet for Patients:  https://www.moore.com/  Fact Sheet for Healthcare Providers:  https://www.young.biz/  This test is no t yet approved or cleared by the [place] FDA and  has been authorized for detection and/or diagnosis of  SARS-CoV-2 by FDA under an Emergency Use Authorization (EUA). This EUA will remain  in effect (meaning this test can be used) for the duration of the COVID-19 declaration under Section 564(b)(1) of the Act, 21 U.S.C. section 360bbb-3(b)(1), unless the authorization is terminated or revoked sooner.     Influenza A by PCR NEGATIVE NEGATIVE Final   Influenza B by PCR NEGATIVE NEGATIVE Final    Comment: (NOTE) The Xpert Xpress SARS-CoV-2/FLU/RSV assay is intended as an aid in  the diagnosis of influenza from Nasopharyngeal swab specimens and  should not be used as a sole basis for treatment. Nasal washings and  aspirates are unacceptable for Xpert Xpress SARS-CoV-2/FLU/RSV  testing.  Fact Sheet for Patients: https://www.moore.com/  Fact Sheet for Healthcare  Providers: https://www.young.biz/  This test is not yet approved or cleared by the [place] FDA and  has been authorized for detection and/or diagnosis of SARS-CoV-2 by  FDA under an Emergency Use Authorization (EUA). This EUA will remain  in effect (meaning this test can be used) for the duration of the  Covid-19 declaration under Section 564(b)(1) of the Act, 21  U.S.C. section 360bbb-3(b)(1), unless the authorization is  terminated or revoked. Performed at [facility], [address] [place], [place] [address]   Aerobic/Anaerobic Culture (surgical/deep wound)     Status: None (Preliminary result)   Collection Time: 01/11/20 12:44 PM   Specimen: PATH Other; Tissue  Result Value Ref Range Status   Specimen Description   Final    WOUND Performed at [facility], [address], [place], [place] [address]    Special Requests   Final    LEFT FOOT Performed at [facility], [address] [place], [place] [address]    Gram Stain   Final    NO WBC SEEN NO ORGANISMS SEEN Performed at [facility], [address], [place], [place] [address]    Culture   Final    RARE METHICILLIN RESISTANT STAPHYLOCOCCUS AUREUS RARE GROUP B STREP(S.AGALACTIAE)ISOLATED TESTING AGAINST S. AGALACTIAE NOT ROUTINELY PERFORMED DUE TO PREDICTABILITY OF AMP/PEN/VAN SUSCEPTIBILITY. NO ANAEROBES ISOLATED; CULTURE IN PROGRESS FOR 5 DAYS    Report Status PENDING  Incomplete   Organism ID, Bacteria METHICILLIN RESISTANT STAPHYLOCOCCUS AUREUS  Final      Susceptibility   Methicillin resistant staphylococcus aureus - MIC*    CIPROFLOXACIN >=8 RESISTANT Resistant     ERYTHROMYCIN >=8 RESISTANT Resistant     GENTAMICIN <=0.5 SENSITIVE Sensitive     OXACILLIN >=4 RESISTANT Resistant     TETRACYCLINE <=1 SENSITIVE Sensitive     VANCOMYCIN 1 SENSITIVE Sensitive     TRIMETH/SULFA <=10 SENSITIVE Sensitive     CLINDAMYCIN >=8 RESISTANT Resistant      RIFAMPIN <=0.5 SENSITIVE Sensitive     Inducible Clindamycin NEGATIVE Sensitive     * RARE METHICILLIN RESISTANT STAPHYLOCOCCUS AUREUS     RN Pressure Injury Documentation:     Estimated body mass index is 51.65 kg/m as calculated from the following:   Height as of this encounter: 5\' 6"  (1.676 m).   Weight as of this encounter: 145.2 kg.  Malnutrition Type:      Malnutrition Characteristics:      Nutrition Interventions:   Radiology Studies: No results found. Scheduled Meds:  ARIPiprazole  5 mg Oral Daily   ascorbic acid  500 mg Oral Daily   atorvastatin  80 mg Oral Daily   budesonide  0.5 mg Nebulization BID   busPIRone  10 mg  Oral BID   calcium citrate-vitamin D  0.5 tablet Oral Daily   cholecalciferol  5,000 Units Oral Daily   famotidine  20 mg Oral Daily   FLUoxetine  20 mg Oral Daily   folic acid  1 mg Oral Daily   insulin aspart  0-9 Units Subcutaneous TID PC & HS   insulin glargine  22 Units Subcutaneous BID   ipratropium-albuterol  3 mL Nebulization BID   levothyroxine  88 mcg Oral QAC breakfast   mupirocin ointment  1 application Topical BID   potassium chloride SA  10 mEq Oral Daily   pramipexole  0.25 mg Oral QHS   pregabalin  150 mg Oral TID   QUEtiapine  300 mg Oral QHS   zolpidem  5 mg Oral QHS   Continuous Infusions:  cefTRIAXone (ROCEPHIN)  IV 2 g (01/14/20 1102)   magnesium sulfate bolus IVPB     sodium chloride     vancomycin 1,500 mg (01/14/20 0658)    LOS: 4 days   Merlene Laughter, DO [facility] is on AMION  If 7PM-7AM, please contact night-coverage [email or web address]

## 2020-01-14 NOTE — Progress Notes (Signed)
Pharmacy Antibiotic Note  [name] [name] is a 59 y.o. female admitted on 01/10/2020 with cellulitis. 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 Pharmacy has been consulted for Vancomycin dosing.  ID consult placed today  Plan: Continue Vancomycin 1500mg  IV every 12 hours, for now. Should this medication be continued, will need to order Vancomycin trough for today.    Continue to monitor renal function and adjust dose as needed.   Plan for I & D and possible bone excision and biopsy today. F/U cultures.    Height: 5\' 6"  (167.6 cm) Weight: (!) 145.2 kg (320 lb) IBW/kg (Calculated) : 59.3  Temp (24hrs), Avg:99 F (37.2 C), Min:98.8 F (37.1 C), Max:99.4 F (37.4 C)  Recent Labs  Lab 01/09/20 2224 01/10/20 0214 01/10/20 0354 01/10/20 0541 01/10/20 0541 01/10/20 0832 01/10/20 1023 01/11/20 0420 01/12/20 0518 01/13/20 0500 01/14/20 0453  WBC   < >  --   --  16.3*   < >  --  14.0* 11.4* 8.9 8.4 8.5  CREATININE   < >  --   --  1.35*   < >  --  1.00 0.79 0.68 0.74 0.72  LATICACIDVEN  --  1.9 1.3 1.1  --  1.3  --   --   --   --   --    < > = values in this interval not displayed.    Estimated Creatinine Clearance: 113.4 mL/min (by C-G formula based on SCr of 0.72 mg/dL).    Allergies  Allergen Reactions  . Cephalexin Hives  . Codeine Palpitations, Nausea Only, Nausea And Vomiting, Rash and Shortness Of Breath    "makes heart fly, she gets flushed and passes out"  . Doxycycline Rash  . Propoxyphene Rash and Shortness Of Breath    Increase heart rate  . Sulfa Antibiotics Palpitations, Nausea Only, Shortness Of Breath and Hives    "makes heart fly, she gets flushed and passes out"  . Lovenox [Enoxaparin Sodium] Hives  . Hydrocodone Nausea And Vomiting    Hear racing & breaks out into a cold sweat.  . Meropenem Rash    Erythematous, hot, pruritic rash over arms, chest, back, abdomen, and face occurred at the end of  meropenem infusion on 02/22/18    Antimicrobials this admission: Vancomycin 9/23  >>  aztreonam 9/23 >> 9/23 Ceftriaxone 9/23>>  Microbiology results:  BCx: NG TD WCx: MRSA; rate GBS (S. Agalactiae)   Thank you for allowing pharmacy to be a part of this patient's care.  10/23, PharmD Clinical Pharmacist 01/14/2020 2:48 PM

## 2020-01-14 NOTE — Progress Notes (Signed)
NAME: [name] [name]  DOB: 1960-05-13  MRN: [id number]  Date/Time: 01/14/2020 10:56 AM  REQUESTING PROVIDER: Dr.Sheikh Subjective:  REASON FOR CONSULT: left foot infection ? [name] [name] is a 59 y.o. female with a history of diabetes mellitus with diabetic foot infection, rheumatoid arthritis, multiple amputation of toes for osteomyelitis, MRSA bacteremia with splenic infarcts in March 2021 and treated with 6 weeks of IV antibiotics presents with left foot ulcer for few months now. She was seen at [facility] podiatry and cultures were MRSA.  She then saw infectious disease physician on 01/09/2020 and was started on linezolid and ciprofloxacin for 21 days. She presented to the ED on 01/09/2020 with left leg pain and redness to the left leg. Vitals showed a temperature of 100, BP 99/55, heart rate 116 and pulse ox of 95% on 2 L oxygen. Labs revealed WBC of 16.3, Hb 10.3, PLT 343, CR 1.05.  Blood cultures were sent.  CT of the foot revealed soft tissue ulceration at the plantar aspect of the left foot underlying the first metatarsal head.  Erosive changes along the lateral aspect of the first metatarsal head and within the lateral hallux sesamoid likely acute on chronic osteomyelitis.  There was also an ill-defined fluid collection adjacent to the lateral aspect of the first metatarsal head within the first intermetatarsal space measuring 1.2 cm reflecting possible septic arthritis.  There was also very linear radiopaque foreign body Seen within the plantar soft tissue of the forefoot underlying the third MTP joint. She was started on vancomycin. She was seen by podiatrist and taken for I&D of the abscess left webspace and excision of the fibular sesamoid left foot on 01/11/2020.  Culture was positive for MRSA and group B streptococcus.  Pathology is still pending. I am asked to see the patient for antibiotic recommendation. Pt says she is feeling better  Past Medical History:  Diagnosis Date  . Abdominal  wall hernia 01/29/2013  . Anxiety   . Arthritis    Rheumatoid  . C. difficile colitis   . Chronic diastolic heart failure (HCC)   . COVID-19 03/23/2019   Diagnosed at [facility] (send-out) on 03/23/2019  . Depression   . Diabetes mellitus    states no meds or diet restrictions  at present  . Diastolic CHF (HCC)   . Esophagitis   . Fluid retention   . GERD (gastroesophageal reflux disease)   . Hiatal hernia   . Hypertension   . Hypokalemia due to loss of potassium 10/21/2015   Overview:  Associated with 3 weeks of diarrhea  And QT prolongation.  . Hypothyroidism   . IBS (irritable bowel syndrome)   . Moderate episode of recurrent major depressive disorder (HCC) 06/03/2004  . Morbid obesity (HCC)   . MRSA (methicillin resistant Staphylococcus aureus) infection 11/2017   left inner thigh abcess  . Neurogenic bladder    has pacemaker  . Neuropathy   . Obesity   . Panic attacks   . Pneumonia due to COVID-19 virus   . Rheumatoid arthritis (HCC)   . Sleep apnea    STATES SEVERE, CANT TOLERATE MASK- LAST STUDY YEARS AGO    Past Surgical History:  Procedure Laterality Date  . ABDOMINAL HYSTERECTOMY    . CHOLECYSTECTOMY    . DG GREAT TOE RIGHT FOOT  02/23/2018  . EYE SURGERY     bilateral cataract extraction with IOL  . HERNIA REPAIR     ventral hernia with strangulation  . IRRIGATION AND DEBRIDEMENT FOOT  Left 01/11/2020   Procedure: IRRIGATION AND DEBRIDEMENT FOOT;  Surgeon: Gwyneth Revels, DPM;  Location: [facility] ORS;  Service: Podiatry;  Laterality: Left;  . LAPAROSCOPIC GASTRIC BANDING  03/20/07  . TEE WITHOUT CARDIOVERSION N/A 07/16/2019   Procedure: TRANSESOPHAGEAL ECHOCARDIOGRAM (TEE);  Surgeon: Debbe Odea, MD;  Location: [facility] ORS;  Service: Cardiovascular;  Laterality: N/A;  . TONSILLECTOMY    . TUBAL LIGATION     02/23/18  amputation of toe, cultures +MRSA, s/p 2 weeks of RX at [facility] -03/28/18 excision of the first metatarsal head, proximal bone cx 1+ MRSA (R: clinda, erythro,  oxacillin; S: doxy, gent, linezolid, bactrim, vanc) 08/02/18: amputation right 2nd toe metatarsophalangeal joint, prox margin viable/no osteo no cx taken -08/24/18: rayectomy of 2nd R toe, OR cx 'clean' grew MRSA (same susc as above); path showed no osteo at margin   Social History   Socioeconomic History  . Marital status: Divorced    Spouse name: Not on file  . Number of children: Not on file  . Years of education: Not on file  . Highest education level: Not on file  Occupational History  . Not on file  Tobacco Use  . Smoking status: Former Smoker    Packs/day: 2.00    Years: 27.00    Pack years: 54.00    Types: Cigarettes    Quit date: 07/30/1999    Years since quitting: 20.4  . Smokeless tobacco: Never Used  Vaping Use  . Vaping Use: Never used  Substance and Sexual Activity  . Alcohol use: No  . Drug use: No  . Sexual activity: Not Currently  Other Topics Concern  . Not on file  Social History Narrative  . Not on file   Social Determinants of Health   Financial Resource Strain:   . Difficulty of Paying Living Expenses: Not on file  Food Insecurity:   . Worried About Programme researcher, broadcasting/film/video in the Last Year: Not on file  . Ran Out of Food in the Last Year: Not on file  Transportation Needs:   . Lack of Transportation (Medical): Not on file  . Lack of Transportation (Non-Medical): Not on file  Physical Activity:   . Days of Exercise per Week: Not on file  . Minutes of Exercise per Session: Not on file  Stress:   . Feeling of Stress : Not on file  Social Connections:   . Frequency of Communication with Friends and Family: Not on file  . Frequency of Social Gatherings with Friends and Family: Not on file  . Attends Religious Services: Not on file  . Active Member of Clubs or Organizations: Not on file  . Attends Banker Meetings: Not on file  . Marital Status: Not on file  Intimate Partner Violence:   . Fear of Current or Ex-Partner: Not on file  .  Emotionally Abused: Not on file  . Physically Abused: Not on file  . Sexually Abused: Not on file    Family History  Problem Relation Age of Onset  . Heart failure Father   . Bipolar disorder Father   . Alcohol abuse Father   . Anxiety disorder Father   . Depression Father   . Heart disease Brother   . Heart attack Brother 48       MI s/p stents placed  . Anxiety disorder Sister   . Depression Sister   . Anxiety disorder Sister   . Depression Sister   . Bipolar disorder Sister   .  Alcohol abuse Sister   . Drug abuse Sister   . Heart attack Brother    Allergies  Allergen Reactions  . Cephalexin Hives  . Codeine Palpitations, Nausea Only, Nausea And Vomiting, Rash and Shortness Of Breath    "makes heart fly, she gets flushed and passes out"  . Doxycycline Rash  . Propoxyphene Rash and Shortness Of Breath    Increase heart rate  . Sulfa Antibiotics Palpitations, Nausea Only, Shortness Of Breath and Hives    "makes heart fly, she gets flushed and passes out"  . Lovenox [Enoxaparin Sodium] Hives  . Hydrocodone Nausea And Vomiting    Hear racing & breaks out into a cold sweat.  . Meropenem Rash    Erythematous, hot, pruritic rash over arms, chest, back, abdomen, and face occurred at the end of meropenem infusion on 02/22/18    ? Current Facility-Administered Medications  Medication Dose Route Frequency Provider Last Rate Last Admin  . acetaminophen (TYLENOL) tablet 650 mg  650 mg Oral Q6H PRN Marguerita Merles Latif, DO       Or  . acetaminophen (TYLENOL) suppository 650 mg  650 mg Rectal Q6H PRN Marland Mcalpine, Kateri Mc Latif, DO      . ALPRAZolam Prudy Feeler) tablet 0.5 mg  0.5 mg Oral BID PRN Gwyneth Revels, DPM      . ARIPiprazole (ABILIFY) tablet 5 mg  5 mg Oral Daily Gwyneth Revels, DPM   5 mg at 01/14/20 0934  . ascorbic acid (VITAMIN C) tablet 500 mg  500 mg Oral Daily Gwyneth Revels, DPM   500 mg at 01/14/20 0934  . atorvastatin (LIPITOR) tablet 80 mg  80 mg Oral Daily Gwyneth Revels,  DPM   80 mg at 01/14/20 0935  . budesonide (PULMICORT) nebulizer solution 0.5 mg  0.5 mg Nebulization BID Gwyneth Revels, DPM   0.5 mg at 01/14/20 0820  . busPIRone (BUSPAR) tablet 10 mg  10 mg Oral BID Gwyneth Revels, DPM   10 mg at 01/14/20 0934  . calcium citrate-vitamin D 500-500 MG-UNIT per chewable tablet 0.5 tablet  0.5 tablet Oral Daily Gwyneth Revels, DPM   0.5 tablet at 01/14/20 0935  . cefTRIAXone (ROCEPHIN) 2 g in sodium chloride 0.9 % 100 mL IVPB  2 g Intravenous Daily Gwyneth Revels, DPM 200 mL/hr at 01/13/20 1233 2 g at 01/13/20 1233  . cholecalciferol (VITAMIN D3) tablet 5,000 Units  5,000 Units Oral Daily Gwyneth Revels, DPM   5,000 Units at 01/14/20 0936  . famotidine (PEPCID) tablet 20 mg  20 mg Oral Daily Gwyneth Revels, DPM   20 mg at 01/14/20 0935  . FLUoxetine (PROZAC) capsule 20 mg  20 mg Oral Daily Gwyneth Revels, DPM   20 mg at 01/14/20 4315  . folic acid (FOLVITE) tablet 1 mg  1 mg Oral Daily Gwyneth Revels, DPM   1 mg at 01/14/20 0935  . insulin aspart (novoLOG) injection 0-9 Units  0-9 Units Subcutaneous TID PC & HS Gwyneth Revels, DPM   2 Units at 01/14/20 [phone]  . insulin glargine (LANTUS) injection 22 Units  22 Units Subcutaneous BID Gwyneth Revels, DPM   22 Units at 01/14/20 0930  . ipratropium-albuterol (DUONEB) 0.5-2.5 (3) MG/3ML nebulizer solution 3 mL  3 mL Nebulization BID Sheikh, Kateri Mc [place], DO   3 mL at 01/14/20 0820  . levothyroxine (SYNTHROID) tablet 88 mcg  88 mcg Oral QAC breakfast Gwyneth Revels, DPM   88 mcg at 01/14/20 0653  . magnesium hydroxide (MILK OF MAGNESIA) suspension 30 mL  30 mL Oral Daily PRN Gwyneth Revels, DPM      . magnesium sulfate 3 g in dextrose 5 % 100 mL IVPB  3 g Intravenous Once Sheikh, Omair Latif, DO      . mupirocin ointment (BACTROBAN) 2 % 1 application  1 application Topical BID Gwyneth Revels, DPM   1 application at 01/13/20 2334  . oxyCODONE (Oxy IR/ROXICODONE) immediate release tablet 5 mg  5 mg Oral Q6H PRN Gwyneth Revels,  DPM   5 mg at 01/13/20 2341  . polyethylene glycol (MIRALAX / GLYCOLAX) packet 17 g  17 g Oral Daily PRN Gwyneth Revels, DPM      . potassium chloride SA (KLOR-CON) CR tablet 10 mEq  10 mEq Oral Daily Gwyneth Revels, DPM   10 mEq at 01/14/20 0935  . pramipexole (MIRAPEX) tablet 0.25 mg  0.25 mg Oral QHS Gwyneth Revels, DPM   0.25 mg at 01/13/20 2335  . pregabalin (LYRICA) capsule 150 mg  150 mg Oral TID Gwyneth Revels, DPM   150 mg at 01/14/20 0936  . QUEtiapine (SEROQUEL) tablet 300 mg  300 mg Oral QHS Gwyneth Revels, DPM   300 mg at 01/13/20 2336  . sodium chloride 0.9 % bolus 500 mL  500 mL Intravenous Once Sheikh, Omair Latif, DO      . vancomycin (VANCOREADY) IVPB 1500 mg/300 mL  1,500 mg Intravenous Q12H Gwyneth Revels, DPM 150 mL/hr at 01/14/20 0658 1,500 mg at 01/14/20 0658  . zolpidem (AMBIEN) tablet 5 mg  5 mg Oral QHS Gwyneth Revels, DPM   5 mg at 01/13/20 2335     Abtx:  Anti-infectives (From admission, onward)   Start     Dose/Rate Route Frequency Ordered Stop   01/11/20 1600  vancomycin (VANCOREADY) IVPB 1500 mg/300 mL        1,500 mg 150 mL/hr over 120 Minutes Intravenous Every 12 hours 01/11/20 0937     01/10/20 1830  cefTRIAXone (ROCEPHIN) 2 g in sodium chloride 0.9 % 100 mL IVPB        2 g 200 mL/hr over 30 Minutes Intravenous Daily 01/10/20 1730     01/10/20 1600  vancomycin (VANCOREADY) IVPB 1250 mg/250 mL  Status:  Discontinued        1,250 mg 166.7 mL/hr over 90 Minutes Intravenous Every 12 hours 01/10/20 0436 01/11/20 0937   01/10/20 0900  aztreonam (AZACTAM) 1 g in sodium chloride 0.9 % 100 mL IVPB  Status:  Discontinued        1 g 200 mL/hr over 30 Minutes Intravenous Every 8 hours 01/10/20 0824 01/10/20 1730   01/10/20 0830  vancomycin (VANCOCIN) IVPB 1000 mg/200 mL premix  Status:  Discontinued        1,000 mg 200 mL/hr over 60 Minutes Intravenous  Once 01/10/20 0824 01/10/20 0838   01/10/20 0830  vancomycin (VANCOCIN) IVPB 1000 mg/200 mL premix  Status:   Discontinued        1,000 mg 200 mL/hr over 60 Minutes Intravenous  Once 01/10/20 0827 01/10/20 0838   01/10/20 0445  vancomycin (VANCOREADY) IVPB 1500 mg/300 mL        1,500 mg 150 mL/hr over 120 Minutes Intravenous  Once 01/10/20 0433 01/10/20 0800   01/10/20 0345  vancomycin (VANCOCIN) IVPB 1000 mg/200 mL premix        1,000 mg 200 mL/hr over 60 Minutes Intravenous  Once 01/10/20 0981 01/10/20 0502      REVIEW OF SYSTEMS:  Const:  Fever before admission, negative  chills, negative weight loss Eyes: negative diplopia or visual changes, negative eye pain ENT: negative coryza, negative sore throat Resp: negative cough, hemoptysis, dyspnea Cards: negative for chest pain, palpitations, lower extremity edema GU: negative for frequency, dysuria and hematuria GI: Negative for abdominal pain, diarrhea, bleeding, constipation Skin: negative for rash and pruritus Heme: negative for easy bruising and gum/nose bleeding MS: generalized weakness Neurolo:negative for headaches, dizziness, vertigo, memory problems  Psych: anxiety, depression  Endocrine:  diabetes Allergy/Immunology- multiple allergies Objective:  VITALS:  BP 121/64 (BP Location: Left Arm)   Pulse 88   Temp 98.8 F (37.1 C) (Oral)   Resp 17   Ht 5\' 6"  (1.676 m)   Wt (!) 145.2 kg   LMP 04/20/2001   SpO2 98%   BMI 51.65 kg/m  PHYSICAL EXAM:  General: Alert, cooperative, no distress, appears stated age.  Head: Normocephalic, without obvious abnormality, atraumatic. Eyes: Conjunctivae clear, anicteric sclerae. Pupils are equal ENT Nares normal. No drainage or sinus tenderness. Lips, mucosa, and tongue normal. No Thrush Neck: Supple, symmetrical, no adenopathy, thyroid: non tender no carotid bruit and no JVD. Back: No CVA tenderness. Lungs:b/l air entry Heart: Regular rate and rhythm, no murmur, rub or gallop. Abdomen: Soft, non-tender,not distended. Bowel sounds normal. No masses Extremities:   Left foot- dressing  removed Surgical site- is clean- small ulcer Erythema shin resolved               atraumatic, no cyanosis. No edema. No clubbing Skin: No rashes or lesions. Or bruising Lymph: Cervical, supraclavicular normal. Neurologic: Grossly non-focal Pertinent Labs Lab Results CBC    Component Value Date/Time   WBC 8.5 01/14/2020 0453   RBC 4.01 01/14/2020 0453   HGB 8.6 (L) 01/14/2020 0453   HGB 12.1 05/27/2014 1439   HCT 28.1 (L) 01/14/2020 0453   HCT 37.5 05/27/2014 1439   PLT 302 01/14/2020 0453   PLT 221 05/27/2014 1439   MCV 70.1 (L) 01/14/2020 0453   MCV 80 05/27/2014 1439   MCH 21.4 (L) 01/14/2020 0453   MCHC 30.6 01/14/2020 0453   RDW 19.5 (H) 01/14/2020 0453   RDW 17.3 (H) 05/27/2014 1439   LYMPHSABS 1.0 01/14/2020 0453   LYMPHSABS 1.6 12/03/2013 0511   MONOABS 0.5 01/14/2020 0453   MONOABS 0.5 12/03/2013 0511   EOSABS 0.0 01/14/2020 0453   EOSABS 0.0 12/03/2013 0511   BASOSABS 0.1 01/14/2020 0453   BASOSABS 0.1 12/03/2013 0511    CMP Latest Ref Rng & Units 01/14/2020 01/13/2020 01/12/2020  Glucose 70 - 99 mg/dL 01/14/2020) 563(O) 756(E)  BUN 6 - 20 mg/dL 12 12 14   Creatinine 0.44 - 1.00 mg/dL 332(R 5.18  Sodium 135 - 145 mmol/L 140 141 140  Potassium 3.5 - 5.1 mmol/L 3.6 3.9 3.8  Chloride 98 - 111 mmol/L 107 109 110  CO2 22 - 32 mmol/L 23 24 22   Calcium 8.9 - 10.3 mg/dL 9.1 8.41) 6.60)  Total Protein 6.5 - 8.1 g/dL 6.7 6.6 6.5  Total Bilirubin 0.3 - 1.2 mg/dL 0.5 0.5 0.6  Alkaline Phos 38 - 126 U/L 76 71 69  AST 15 - 41 U/L 17 14(L) 15  ALT 0 - 44 U/L 15 14 14       Microbiology: Recent Results (from the past 240 hour(s))  Blood culture (routine x 2)     Status: None (Preliminary result)   Collection Time: 01/10/20  2:14 AM   Specimen: BLOOD  Result Value Ref Range Status   Specimen Description BLOOD  RIGHT FA  Final   Special Requests   Final    BOTTLES DRAWN AEROBIC AND ANAEROBIC Blood Culture adequate volume   Culture   Final    NO GROWTH 4  DAYS Performed at [facility], [address] [place], [place] [address]    Report Status PENDING  Incomplete  Blood culture (routine x 2)     Status: None (Preliminary result)   Collection Time: 01/10/20  3:54 AM   Specimen: BLOOD  Result Value Ref Range Status   Specimen Description BLOOD RIGHT [facility]  Final   Special Requests   Final    BOTTLES DRAWN AEROBIC AND ANAEROBIC Blood Culture adequate volume   Culture   Final    NO GROWTH 4 DAYS Performed at [facility], [address], [place], [place] [address]    Report Status PENDING  Incomplete  Respiratory Panel by RT PCR (Flu A&B, Covid) - Nasopharyngeal Swab     Status: None   Collection Time: 01/10/20  8:32 AM   Specimen: Nasopharyngeal Swab  Result Value Ref Range Status   SARS Coronavirus 2 by RT PCR NEGATIVE NEGATIVE Final    Comment: (NOTE) SARS-CoV-2 target nucleic acids are NOT DETECTED.  The SARS-CoV-2 RNA is generally detectable in upper respiratoy specimens during the acute phase of infection. The lowest concentration of SARS-CoV-2 viral copies this assay can detect is 131 copies/mL. A negative result does not preclude SARS-Cov-2 infection and should not be used as the sole basis for treatment or other patient management decisions. A negative result may occur with  improper specimen collection/handling, submission of specimen other than nasopharyngeal swab, presence of viral mutation(s) within the areas targeted by this assay, and inadequate number of viral copies (<131 copies/mL). A negative result must be combined with clinical observations, patient history, and epidemiological information. The expected result is Negative.  Fact Sheet for Patients:  https://www.moore.com/  Fact Sheet for Healthcare Providers:  https://www.young.biz/  This test is no t yet approved or cleared by the [place] FDA and  has been authorized for detection and/or  diagnosis of SARS-CoV-2 by FDA under an Emergency Use Authorization (EUA). This EUA will remain  in effect (meaning this test can be used) for the duration of the COVID-19 declaration under Section 564(b)(1) of the Act, 21 U.S.C. section 360bbb-3(b)(1), unless the authorization is terminated or revoked sooner.     Influenza A by PCR NEGATIVE NEGATIVE Final   Influenza B by PCR NEGATIVE NEGATIVE Final    Comment: (NOTE) The Xpert Xpress SARS-CoV-2/FLU/RSV assay is intended as an aid in  the diagnosis of influenza from Nasopharyngeal swab specimens and  should not be used as a sole basis for treatment. Nasal washings and  aspirates are unacceptable for Xpert Xpress SARS-CoV-2/FLU/RSV  testing.  Fact Sheet for Patients: https://www.moore.com/  Fact Sheet for Healthcare Providers: https://www.young.biz/  This test is not yet approved or cleared by the [place] FDA and  has been authorized for detection and/or diagnosis of SARS-CoV-2 by  FDA under an Emergency Use Authorization (EUA). This EUA will remain  in effect (meaning this test can be used) for the duration of the  Covid-19 declaration under Section 564(b)(1) of the Act, 21  U.S.C. section 360bbb-3(b)(1), unless the authorization is  terminated or revoked. Performed at [facility], [address] [place], [place] [address]   Aerobic/Anaerobic Culture (surgical/deep wound)     Status: None (Preliminary result)   Collection Time: 01/11/20 12:44 PM  Specimen: PATH Other; Tissue  Result Value Ref Range Status   Specimen Description   Final    WOUND Performed at [facility], [address], [place], [place] [address]    Special Requests   Final    LEFT FOOT Performed at [facility], [address] [place], [place] [address]    Gram Stain   Final    NO WBC SEEN NO ORGANISMS SEEN Performed at [facility], [address] [place],  [place] [address]    Culture   Final    RARE METHICILLIN RESISTANT STAPHYLOCOCCUS AUREUS RARE GROUP B STREP(S.AGALACTIAE)ISOLATED TESTING AGAINST S. AGALACTIAE NOT ROUTINELY PERFORMED DUE TO PREDICTABILITY OF AMP/PEN/VAN SUSCEPTIBILITY. NO ANAEROBES ISOLATED; CULTURE IN PROGRESS FOR 5 DAYS    Report Status PENDING  Incomplete   Organism ID, Bacteria METHICILLIN RESISTANT STAPHYLOCOCCUS AUREUS  Final      Susceptibility   Methicillin resistant staphylococcus aureus - MIC*    CIPROFLOXACIN >=8 RESISTANT Resistant     ERYTHROMYCIN >=8 RESISTANT Resistant     GENTAMICIN <=0.5 SENSITIVE Sensitive     OXACILLIN >=4 RESISTANT Resistant     TETRACYCLINE <=1 SENSITIVE Sensitive     VANCOMYCIN 1 SENSITIVE Sensitive     TRIMETH/SULFA <=10 SENSITIVE Sensitive     CLINDAMYCIN >=8 RESISTANT Resistant     RIFAMPIN <=0.5 SENSITIVE Sensitive     Inducible Clindamycin NEGATIVE Sensitive     * RARE METHICILLIN RESISTANT STAPHYLOCOCCUS AUREUS    IMAGING RESULTS: I have personally reviewed the films ? Impression/Recommendation Diabetic foot infection of the left foot. Status post I&D of abscess and excision of the fibular sesamoid.  Culture MRSA and group B strep Pathology still pending Patient has multiple antibiotic allergy and is currently on vancomycin.  As she is on fluoxetine, buspar quetiapine hesitant to do linezolid because of high risk for serotonin syndrome  Will benefit from weekly dalbavancin for 2 to 4 weeks.( depending on pathology)  Diabetes mellitus management as per primary team  Rheumatoid arthritis used to be on methotrexate and prednisone which is she currently is not on and she is also not getting tumor necrosis factor inhibitor for the past 6 weeks ? History of MRSA bacteremia in March 2021 treated with 6 weeks of IV vancomycin ?  Past history of recurrent diabetic foot infection with multiple surgeries.  _History of Covid pneumonia with ARDS in December 2020 and is currently on  home oxygen __________________________________________________ Discussed with patient,and pharmacist who is checking with infusion company regarding insurance coverage  Note:  This document was prepared using Sales executive software and may include unintentional dictation errors.

## 2020-01-15 LAB — COMPREHENSIVE METABOLIC PANEL
ALT: 14 U/L (ref 0–44)
AST: 14 U/L — ABNORMAL LOW (ref 15–41)
Albumin: 2.6 g/dL — ABNORMAL LOW (ref 3.5–5.0)
Alkaline Phosphatase: 73 U/L (ref 38–126)
Anion gap: 8 (ref 5–15)
BUN: 12 mg/dL (ref 6–20)
CO2: 23 mmol/L (ref 22–32)
Calcium: 9.3 mg/dL (ref 8.9–10.3)
Chloride: 109 mmol/L (ref 98–111)
Creatinine, Ser: 0.73 mg/dL (ref 0.44–1.00)
GFR calc Af Amer: >60 mL/min (ref >60)
GFR calc non Af Amer: >60 mL/min (ref >60)
Glucose, Bld: 163 mg/dL — ABNORMAL HIGH (ref 70–99)
Potassium: 3.6 mmol/L (ref 3.5–5.1)
Sodium: 140 mmol/L (ref 135–145)
Total Bilirubin: 0.5 mg/dL (ref 0.3–1.2)
Total Protein: 6.6 g/dL (ref 6.5–8.1)

## 2020-01-15 LAB — CBC WITH DIFFERENTIAL/PLATELET
Abs Immature Granulocytes: 0.1 10*3/uL — ABNORMAL HIGH (ref 0.00–0.07)
Basophils Absolute: 0 10*3/uL (ref 0.0–0.1)
Basophils Relative: 1 %
Eosinophils Absolute: 0 10*3/uL (ref 0.0–0.5)
Eosinophils Relative: 0 %
HCT: 28.3 % — ABNORMAL LOW (ref 36.0–46.0)
Hemoglobin: 8.2 g/dL — ABNORMAL LOW (ref 12.0–15.0)
Immature Granulocytes: 1 %
Lymphocytes Relative: 14 %
Lymphs Abs: 1.1 10*3/uL (ref 0.7–4.0)
MCH: 20.8 pg — ABNORMAL LOW (ref 26.0–34.0)
MCHC: 29 g/dL — ABNORMAL LOW (ref 30.0–36.0)
MCV: 71.8 fL — ABNORMAL LOW (ref 80.0–100.0)
Monocytes Absolute: 0.5 10*3/uL (ref 0.1–1.0)
Monocytes Relative: 7 %
Neutro Abs: 5.8 10*3/uL (ref 1.7–7.7)
Neutrophils Relative %: 77 %
Platelets: 302 10*3/uL (ref 150–400)
RBC: 3.94 MIL/uL (ref 3.87–5.11)
RDW: 19.2 % — ABNORMAL HIGH (ref 11.5–15.5)
WBC: 7.6 10*3/uL (ref 4.0–10.5)
nRBC: 0 % (ref 0.0–0.2)

## 2020-01-15 LAB — CULTURE, BLOOD (ROUTINE X 2)
Culture: NO GROWTH
Culture: NO GROWTH
Special Requests: ADEQUATE
Special Requests: ADEQUATE

## 2020-01-15 LAB — GLUCOSE, CAPILLARY
Glucose-Capillary: 146 mg/dL — ABNORMAL HIGH (ref 70–99)
Glucose-Capillary: 138 mg/dL — ABNORMAL HIGH (ref 70–99)

## 2020-01-15 LAB — MAGNESIUM: Magnesium: 1.7 mg/dL (ref 1.7–2.4)

## 2020-01-15 LAB — SURGICAL PATHOLOGY

## 2020-01-15 LAB — PHOSPHORUS: Phosphorus: 5.1 mg/dL — ABNORMAL HIGH (ref 2.5–4.6)

## 2020-01-15 MED ORDER — TRESIBA FLEXTOUCH 200 UNIT/ML ~~LOC~~ SOPN
22.0000 [IU] | PEN_INJECTOR | Freq: Two times a day (BID) | SUBCUTANEOUS | 0 refills | Status: AC
Start: 2020-01-15 — End: 2020-02-14

## 2020-01-15 MED ORDER — MAGNESIUM SULFATE IN D5W 1-5 GM/100ML-% IV SOLN
1.0000 g | Freq: Once | INTRAVENOUS | Status: AC
  Administered 2020-01-15: 1 g via INTRAVENOUS
  Filled 2020-01-15: qty 100

## 2020-01-15 MED ORDER — DEXTROSE 5 % IV SOLN: 1500.0000 mg | INTRAVENOUS | 0 refills | Status: AC

## 2020-01-15 MED ORDER — LINEZOLID 600 MG PO TABS: 600.0000 mg | ORAL_TABLET | Freq: Once | ORAL | 0 refills | Status: AC

## 2020-01-15 MED ORDER — POTASSIUM CHLORIDE CRYS ER 20 MEQ PO TBCR
40.0000 meq | EXTENDED_RELEASE_TABLET | Freq: Two times a day (BID) | ORAL | Status: DC
  Administered 2020-01-15: 40 meq via ORAL
  Filled 2020-01-15: qty 2

## 2020-01-15 MED ORDER — MAGNESIUM SULFATE 2 GM/50ML IV SOLN
2.0000 g | Freq: Once | INTRAVENOUS | Status: AC
  Administered 2020-01-15: 2 g via INTRAVENOUS
  Filled 2020-01-15: qty 50

## 2020-01-15 MED ORDER — ALBUTEROL SULFATE (2.5 MG/3ML) 0.083% IN NEBU: 2.5000 mg | INHALATION_SOLUTION | RESPIRATORY_TRACT | Status: DC | PRN

## 2020-01-15 MED ORDER — ACETAMINOPHEN 325 MG PO TABS: 650.0000 mg | ORAL_TABLET | Freq: Four times a day (QID) | ORAL | 0 refills | Status: AC | PRN

## 2020-01-15 MED ORDER — MAGNESIUM SULFATE 50 % IJ SOLN: 3.0000 g | Freq: Once | INTRAVENOUS | Status: DC

## 2020-01-15 MED ORDER — CALCIUM CITRATE-VITAMIN D 500-500 MG-UNIT PO CHEW: 0.5000 | CHEWABLE_TABLET | Freq: Every day | ORAL | 0 refills | Status: DC

## 2020-01-15 MED ORDER — DEXTROSE 5 % IV SOLN: 1500.0000 mg | INTRAVENOUS | 0 refills | Status: DC

## 2020-01-15 NOTE — Anesthesia Postprocedure Evaluation (Signed)
Anesthesia Post Note  Patient: [name] [name]  Procedure(s) Performed: IRRIGATION AND DEBRIDEMENT FOOT (Left )  Patient location during evaluation: PACU Anesthesia Type: General Level of consciousness: awake and alert Pain management: pain level controlled Vital Signs Assessment: post-procedure vital signs reviewed and stable Respiratory status: spontaneous breathing, nonlabored ventilation, respiratory function stable and patient connected to nasal cannula oxygen Cardiovascular status: blood pressure returned to baseline and stable Postop Assessment: no apparent nausea or vomiting Anesthetic complications: no   No complications documented.   Last Vitals:  Vitals:   01/14/20 2050 01/14/20 2335  BP:  115/60  Pulse:  85  Resp:  16  Temp:  (!) 36.4 C  SpO2: 97% 100%    Last Pain:  Vitals:   01/15/20 0001  TempSrc:   PainSc: Asleep                 Corinda Gubler

## 2020-01-15 NOTE — Progress Notes (Signed)
D/C summary reviewed with pt and Mom both expressed understanding.  IV removed from pts L forearm without issue.  Pt assisted to car by staff.

## 2020-01-15 NOTE — Progress Notes (Deleted)
Pharmacy Antibiotic Note  [name] [name] is a 59 y.o. female admitted on 01/10/2020 with cellulitis (complicated by foot abscess and osteomyelitis of the first metatarsal). 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 Pharmacy has been consulted for Vancomycin dosing.  Vancomycin was given at 2026 yesterday (was due prior to 6th dose at 1900) and the Vancomycin trough was obtained at 1831 (on time).  Vancomycin trough is 18 mcg/mL.  Plan: Given patient's complicated cellulitis/osteomyelitis, will continue Vancomycin 1500mg  IV every 12 hours.   Continue to monitor renal function and adjust dose as needed.     Height: 5\' 6"  (167.6 cm) Weight: (!) 145.2 kg (320 lb) IBW/kg (Calculated) : 59.3  Temp (24hrs), Avg:98.5 F (36.9 C), Min:97.5 F (36.4 C), Max:99.4 F (37.4 C)  Recent Labs  Lab 01/09/20 2224 01/10/20 0214 01/10/20 0354 01/10/20 0541 01/10/20 0832 01/10/20 1023 01/11/20 0420 01/12/20 0518 01/13/20 0500 01/14/20 0453 01/14/20 1831 01/15/20 0410  WBC   < >  --   --  16.3*  --    < > 11.4* 8.9 8.4 8.5  --  7.6  CREATININE   < >  --   --  1.35*  --    < > 0.79 0.68 0.74 0.72  --  0.73  LATICACIDVEN  --  1.9 1.3 1.1 1.3  --   --   --   --   --   --   --   VANCOTROUGH  --   --   --   --   --   --   --   --   --   --  18  --    < > = values in this interval not displayed.    Estimated Creatinine Clearance: 113.4 mL/min (by C-G formula based on SCr of 0.73 mg/dL).    Allergies  Allergen Reactions  . Cephalexin Hives  . Codeine Palpitations, Nausea Only, Nausea And Vomiting, Rash and Shortness Of Breath    "makes heart fly, she gets flushed and passes out"  . Doxycycline Rash  . Propoxyphene Rash and Shortness Of Breath    Increase heart rate  . Sulfa Antibiotics Palpitations, Nausea Only, Shortness Of Breath and Hives    "makes heart fly, she gets flushed and passes out"  . Lovenox [Enoxaparin Sodium] Hives  .  Hydrocodone Nausea And Vomiting    Hear racing & breaks out into a cold sweat.  . Meropenem Rash    Erythematous, hot, pruritic rash over arms, chest, back, abdomen, and face occurred at the end of meropenem infusion on 02/22/18    Antimicrobials this admission: Vancomycin 9/23  >>  aztreonam 9/23 >> 9/23 Ceftriaxone 9/23>>  Microbiology results:  BCx: NG TD WCx: MRSA; rate GBS (S. Agalactiae)   Thank you for allowing pharmacy to be a part of this patient's care.  10/23, PharmD Clinical Pharmacist 01/15/2020 7:17 AM

## 2020-01-15 NOTE — Discharge Summary (Signed)
Physician Discharge Summary  LATAUSHA FLAMM POE:[id number] DOB: 1961-01-13 DOA: 01/10/2020  PCP: [facility]  Admit date: 01/10/2020 Discharge date: 01/15/2020  Admitted From: Home Disposition: Home with Home Health  Recommendations for Outpatient Follow-up:  1. Follow up with PCP in 1-2 weeks 2. Follow up with ID Dr. Delaine Lame on 01/29/20 at 11:00 am 3. Follow up with Podiatry as an outpatient; Has an appointment on 9/29 at [facility] 4. Please obtain CMP/CBC, Mag, Phos in one week 5. Please follow up on the following pending results:  Home Health: YES Equipment/Devices: Infusion Pump  Discharge Condition: Stable CODE STATUS: FULL CODE Diet recommendation:   Brief/Interim Summary: The patient is a super morbidly obese Caucasian female with a past medical history significant for but not limited to anxiety, abdominal wall hernia, rheumatoid arthritis, history of C. difficile colitis, history of chronic diastolic heart failure, history of COVID-19 disease, depression, uncontrolled diabetes mellitus type 2, hypertension, irritable bowel syndrome, major depressive disorder, history of MRSA infection, history of panic attacks, as well as other comorbidities as well as sleep apnea who presented with leg pain and noticed worsening left lower extremity swelling erythema and tenderness that extended from her left foot to her upper leg. She has a left plantar big toe ulcer that she stated was MRSA positive and admitted to having some chills but denied any fever. She denies any nausea or vomiting. She presented to the emergency room and found a blood pressure 99/55 and a pulse of 116 and labs revealed leukocytosis of 16.3 and lactic acid level is 1.9. She was given IV fentanyl for the pain and given 1 L bolus of normal saline as well as IV vancomycin she is admitted for further evaluation.  **Interim History The patient was also given IV Azactam and this was changed to IV ceftriaxone.   Podiatry was consulted for further evaluation recommendations and they recommend x-rays of the foot.  It showed diffuse soft tissue swelling and soft tissue ulceration noted at the distal plantar aspect of the foot as well as an overlying thin linear metallic density noted at the level of the second and third metatarsals along the plantar aspect of the foot suggesting a foreign body.  Because the patient was unable to undergo an MRI of the foot given her bladder stimulator she had a foot CT scan done which showed soft tissue ulceration at the plantar aspect of the left foot underlying the first metatarsal head as well as erosive changes along the lateral aspect of the first metatarsal head and within the lateral hallux sesamoid were suggestive of osteomyelitis which was acute on chronic.  Patient also had a small ill-defined fluid collection adjacent to the lateral aspect of the first metatarsal head within the first intermetatarsal space measuring approximately 1.2 cm concerning for an abscess.  Patient also had small first MTP joint effusion which may reflected septic arthritis and a 7 mm linear radiopaque foreign body at the plantar soft tissues of the forefoot in line with the third MTP joint.  Podiatric surgery is planning on taking the patient to the OR today for an I&D and possible bone excision and biopsy and this was done 01/11/2020.  01/12/20 The patient is Status post I&D of the abscess of the left webspace as well as excision of the fibular sesamoid bone of the left foot and is POD1. Cx's are pending and her dressing was changed today by podiatry. CBG's Continue to remain very well controlled and white blood cell count  is trending down. Will discuss with ID about Abx Management and Duration. Podiatry recommending NWB to the Left Foot   01/13/2020 Patient feels well and happy that her foot is in no pain.  Magnesium was a little low so this is being repleted.  We will formally consult infectious  diseases and await the bone culture prior to safe discharge disposition to know what targeted antibiotic therapy to provide the patient on.  She may or may not need a PICC but will have infectious disease formally evaluate  01/14/2020 He feels a little sleepy and did not sleep very well last night.  ID was formally consulted for further evaluation recommendations and cultures are growing out rare methicillin-resistant staph aureus as well as group B strep.  We will have ID formally adjust antibiotics and decide on the length of treatment..  **Patient is feeling well and stable to D/C. Foot Cx's showing MRSA and Group B Strep so will be given 1x dose of Linezolid today and transitioned to Dalbavancin 1500 mg x1 on 9/29 and on 01/23/20. She is stable to D/C and follow up with PCP, Podiatry, and ID as an outpatient.   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 -She wa admitted to a medical monitored bed. -She will be continued on IV antibiotic therapy with vancomycin especially given her MRSA positive infected left plantar great toe wound andgiven allergy toIV cefepimeand meropenem she was given IV Azactam however after further review she was tolerant of IV ceftriaxone and will de-escalate and continue IV vancomycin and continue IV ceftriaxone for now  -Foot x-rays at outside facility but then had them repeated here -Foot X-Ray showed "Diffuse severe soft tissue swelling. Soft tissue ulceration noted along the distal plantar aspect of the left foot. 2. Overlying thin linear metallic  density noted at the level the distal second and third metatarsals along the plantar aspect of the foot suggesting a foreign body. 3. No acute bony abnormality. No bony erosions" -Because of this podiatry recommended obtaining MRI but is unable to be done given her bladder stimulator so CT scan of the foot was done without contrast -CT scan of the left foot without contrast showed "Soft tissue ulceration at the plantar aspect of the left foot underlying the first metatarsal head. Erosive changes along the lateral aspect of the first metatarsal head and within the lateral hallux sesamoid, which have partially corticated margins. Findings suggest sequela of osteomyelitis, likely acute on chronic. 2. Small ill-defined fluid collection adjacent to the lateral aspect of the first metatarsal head within the first intermetatarsal space measuring approximately 1.2 cm, concerning for an abscess. 3. Small first MTP joint effusion, which may reflect septic arthritis. 4. 7 mm linear radiopaque foreign body within the plantar soft tissues of the forefoot underlying the third MTP joint."  -WBC is improving and went from 16.3 -> 14.0 -> 11.4 -> 8.9 -> 8.4 -> 8.5 -> 7.6 -Lactic acid level was normal at 1.9 and trended down to 1.1 and repeat is now 1.3; her procalcitonin level was 0.16 -Pain management will be providedand she has Oxycodone IR 5  mg po q6hprn.  Patient also has pregabalin 150 g p.o. 3 times daily -Warm compresses will be applied.  Recommending elevation of the extremity -IVF now stopped  -We will continue mupirocin ointment. -Wound care consult will be obtained. -Have consultedPodiatryDr. Fowlerfor further evaluation recommendations and will be obtaining ABIs -Underwent surgical intervention yesterday and had an I&D and sesamoid bone removal is postoperative day 3 -Surgical cultures 01/11/2020 showed no WBCs seen and no organisms he has but culture grew out rare Methicillin Resistant Staph aureus and   Rare Group B Strep with culture reports showing that the MRSA is only sensitive to gentamicin, inducible clindamycin, rifampin, tetracycline, Bactrim and vancomycin; has a history of MRSA in the past and has seen infectious diseases at [facility] -ID formally consulted and they will give Linezolid x1 and change to Dalbavancin 1500 mg x1 on 9/29 and 10/6; Following up with ID Dr. Delaine Lame on 01/29/20 at 11:00 AM  -Podiatry recommending daily dressing changes and following up with her regular podiatrist at discharge; she has a appointment on Wednesday, 01/16/2020 -Podiatry still recommends nonweightbearing as tolerated and using a rolling knee scooter at home and they have signed off the case. -Follow up with Primary Podiatrist as an outpatient. Has an Appointment 9/29  Anxiety/Depression. -We will continue her alprazolam 0.5 mg p.o. twice daily as needed anxiety, fluoxetine 20 mg p.o. daily, buspirone 10 mg p.o. twice daily, quetiapine 300 g p.o. daily nightly, pregabalin 150 mg p.o. 3 times daily, and aripiprazole 5 mg p.o. daily  UncontrolledType 2 diabetes mellitus with Peripheral Neuropathy. -She will be placed on supplemental coverage with NovoLog and will continue her basal coverage.Currently getting 22 units of Lantus subcu twice daily as well as sensitive NovoLog sliding scale insulin AC; Resume home Insulin regiment and Home Triesiba  -Metformin will be held off bur resume that and Trajenta as an outpatient  -Hemoglobin A1c was 10.8 -We will continue Lyricafor her neuropathy -Blood sugars have been ranging from 146-183 on daily BMP/CMP's: CBGs ranging from 138-180  Hypothyroidism. -We will continue Levothyroxine 88 mcg p.o. daily and check TSH level as an outpatient   Chronic Diastolic CHF -Currently not in exacerbation -Her Lasix is being held given her sepsis physiology and AKI and IVF now stopped -Strict I's and O's and daily weights; Patient is +3.6749 Liters since  admission -Continue to monitor extremely carefully and continue to monitor for signs and symptoms of volume overload; he does not appear volume overloaded  GERD -Continue with Famotidine 20 g p.o. daily  Hyperlipidemia -Continue with Atorvastatin 80 mg po Daily  Chronic Respiratory Failure with Hypoxia -Patient wears 2 L as needed at home and was on 2 L this morning but likely can be weaned off easily -Likely in setting of obesity hypoventilation syndrome and suspected OSA -SpO2: 93 % O2 Flow Rate (L/min): 2.5 L/min -Continue monitor her oxygen saturations and maintain O2 saturation 90%  AKI -Patient had a BUN/creatinine of 32/1.35 on presentation -This is now improving after IV fluid hydration and is now 12/0.73 -IV fluid hydration is now stopped -Avoid further nephrotoxic medications, contrast dyes, hypotension and renally adjust medications -We will hold her Lasix for now -Continue monitor and trend renal function carefully and repeat CMP in a.m.  Rheumatoid Arthritis -No longer taking methotrexate or prednisone and her Abatacept infusions have been held for the last 4 to 6 weeks due to foot infection  MicrocyticAnemia -Patient's hemoglobin/hematocrit was trending down slowly but now stable as Hgb/Hct is now 8.2/28.3;MCV  is now 71.8 -Checked anemia panel showed an iron level of 17, U IBC of 250, TIBC of 267, saturation ratios of 6%, ferritin level of 72, folate level of 38.0, and vitamin B12 level of 211 -Continue to monitor for signs and symptoms of bleeding; currently no overt bleeding noted -Repeat CBC in a.m.  Hypomagnesemia -Patient's Mag Level this AM was 1.7 -Replete with IV Mag Sulfate 3 grams today again  -Continue to Monitor and Replete as Necessary -Repeat Mag Level in the AM  SuperMorbidObesity -Complicates her overall care and her overall prognosis -Estimated body mass index is 51.65 kg/m as calculated from the following:   Height as of this  encounter: _0  (1.676 m).   Weight as of this encounter: 145.2 kg. -Weight Loss and Dietary Counseling given   Discharge Instructions  Discharge Instructions    [facility] pharmacist to adjust dose for Vancomycin, Aminoglycosides and other anti-infective therapies as requested by physician.   Complete by: As directed    Anaphylaxis Kit: Provided to treat any anaphylactic reaction to the medication being provided to the patient if First Dose or when requested by physician   Complete by: As directed    Epinephrine 65m/ml vial / amp: Administer 0.375m(0.26m52msubcutaneously once for moderate to severe anaphylaxis, nurse to call physician and pharmacy when reaction occurs and call 911 if needed for immediate care   Diphenhydramine 76m60m IV vial: Administer 25-76mg626mIM PRN for first dose reaction, rash, itching, mild reaction, nurse to call physician and pharmacy when reaction occurs   Sodium Chloride 0.9% NS 500ml 18mAdminister if needed for hypovolemic blood pressure drop or as ordered by physician after call to physician with anaphylactic reaction   Call MD for:  difficulty breathing, headache or visual disturbances   Complete by: As directed    Call MD for:  extreme fatigue   Complete by: As directed    Call MD for:  hives   Complete by: As directed    Call MD for:  persistant dizziness or light-headedness   Complete by: As directed    Call MD for:  persistant nausea and vomiting   Complete by: As directed    Call MD for:  redness, tenderness, or signs of infection (pain, swelling, redness, odor or green/yellow discharge around incision site)   Complete by: As directed    Call MD for:  severe uncontrolled pain   Complete by: As directed    Call MD for:  temperature >100.4   Complete by: As directed    Diet - low sodium heart healthy   Complete by: As directed    Discharge instructions   Complete by: As directed    You were cared for by a hospitalist during your  hospital stay. If you have any questions about your discharge medications or the care you received while you were in the hospital after you are discharged, you can call the unit and ask to speak with the hospitalist on call if the hospitalist that took care of you is not available. Once you are discharged, your primary care physician will handle any further medical issues. Please note that NO REFILLS for any discharge medications will be authorized once you are discharged, as it is imperative that you return to your primary care physician (or establish a relationship with a primary care physician if you do not have one) for your aftercare needs so that they can reassess your need for medications and monitor your lab values.  Follow up with PCP, Podiatry, and Infectious Diseases as an outpatient. Take all medications as prescribed. If symptoms change or worsen please return to the ED for evaluation   Discharge wound care:   Complete by: As directed    Per Podiatry Reccs: Can begin daily dressing changes while in house   Home infusion instructions - [facility]   Complete by: As directed    Instructions: Flush IV access with Sodium Chloride 0.9% and Heparin 10units/ml or 100units/ml   Change dressing on IV access line: Weekly and PRN   Instructions Cath Flo 71m: Administer for PICC Line occlusion and as ordered by physician for other access device   [facility] pharmacist to adjust dose for: Vancomycin, Aminoglycosides and other anti-infective therapies as requested by physician   Increase activity slowly   Complete by: As directed    Method of administration may be changed at the discretion of home infusion pharmacist based upon assessment of the patient and/or caregiver's ability to self-administer the medication ordered   Complete by: As directed      Allergies as of 01/15/2020      Reactions   Cephalexin Hives   Codeine Palpitations, Nausea Only, Nausea And Vomiting, Rash,  Shortness Of Breath   "makes heart fly, she gets flushed and passes out"   Doxycycline Rash   Propoxyphene Rash, Shortness Of Breath   Increase heart rate   Sulfa Antibiotics Palpitations, Nausea Only, Shortness Of Breath, Hives   "makes heart fly, she gets flushed and passes out"   Lovenox [enoxaparin Sodium] Hives   Hydrocodone Nausea And Vomiting   Hear racing & breaks out into a cold sweat.   Meropenem Rash   Erythematous, hot, pruritic rash over arms, chest, back, abdomen, and face occurred at the end of meropenem infusion on 02/22/18      Medication List    STOP taking these medications   GNP Calcium Citrate+D Maximum 315-250 MG-UNIT Tabs Generic drug: Calcium Citrate-Vitamin D Replaced by: calcium citrate-vitamin D 500-500 MG-UNIT chewable tablet   potassium chloride 10 MEQ tablet Commonly known as: KLOR-CON     TAKE these medications   acetaminophen 325 MG tablet Commonly known as: TYLENOL Take 2 tablets (650 mg total) by mouth every 6 (six) hours as needed for mild pain or moderate pain (or Fever >/= 101). Notes to patient: Last dose given 01/13/2020 at 05:08am   ALPRAZolam 0.5 MG tablet Commonly known as: XANAX Take 1 tablet (0.5 mg total) by mouth 2 (two) times daily as needed for anxiety. Notes to patient: Not given while in hospital.    ARIPiprazole 5 MG tablet Commonly known as: ABILIFY TAKE 1 TABLET BY MOUTH ONCE DAILY. Notes to patient: Last dose given 01/15/2020 at 08:35am   ascorbic acid 500 MG tablet Commonly known as: VITAMIN C Take 1 tablet (500 mg total) by mouth daily. Notes to patient: Last dose given 01/15/2020 at 08:34am   atorvastatin 80 MG tablet Commonly known as: LIPITOR Take 80 mg by mouth daily. Notes to patient: Last dose given 01/15/2020 at 08:34am   budesonide 0.5 MG/2ML nebulizer solution Commonly known as: PULMICORT Take 2 mLs (0.5 mg total) by nebulization 2 (two) times daily. Notes to patient: Last dose given 01/15/2020 at  08:05am   busPIRone 10 MG tablet Commonly known as: BUSPAR Take 1 tablet (10 mg total) by mouth 2 (two) times daily. Notes to patient: Last dose given 01/15/2020 at 08:05am   calcium citrate-vitamin D 500-500 MG-UNIT chewable tablet  Chew 0.5 tablets by mouth daily. Start taking on: January 16, 2020 Replaces: [facility] Calcium Citrate+D Maximum 315-250 MG-UNIT Tabs Notes to patient: Last dose given 01/15/2020 at 08:35am   dalbavancin 1,500 mg in dextrose 5 % 500 mL Inject 1,500 mg into the vein every 7 (seven) days for 2 doses. Give dalbavancin 1589m IVPB on 01/16/2020 and 01/23/2020 Indication: MRSA and Grp B strep diabetic foot infection and possible osteomyelitis First Dose: No Last Day of Therapy:  01/23/2020 Labs on 01/23/2020: CBC with Diff, CMP, ESR, CRP Method of administration may be changed at the discretion of home infusion pharmacist based upon assessment of the patient and/or caregiver's ability to self-administer the medication ordered. Start taking on: January 16, 2020   famotidine 20 MG tablet Commonly known as: PEPCID Take 1 tablet (20 mg total) by mouth daily. Notes to patient: Last dose given 01/15/2020 at 08:35am   FLUoxetine 20 MG capsule Commonly known as: PROZAC TAKE (1) CAPSULE BY MOUTH ONCE DAILY. What changed: See the new instructions. Notes to patient: Last dose given 096/29/5284013:24MW  folic acid 1 MG tablet Commonly known as: FOLVITE Take 1 mg by mouth daily. Notes to patient: Last dose given 01/15/2020 at 08:35am   furosemide 20 MG tablet Commonly known as: LASIX TAKE (1) TABLET BY MOUTH DAILY AS NEEDED. Notes to patient: Last dose given 01/10/2020 at 10:12am   insulin lispro 100 UNIT/ML injection Commonly known as: HUMALOG Inject 0.08 mLs (8 Units total) into the skin 3 (three) times daily before meals. Notes to patient: Not given while in hospital.    ipratropium-albuterol 0.5-2.5 (3) MG/3ML Soln Commonly known as: DUONEB Take 3 mLs by  nebulization 3 (three) times daily. Notes to patient: Last dose given 01/15/2020 at 08:05am   levothyroxine 88 MCG tablet Commonly known as: SYNTHROID Take 88 mcg by mouth daily before breakfast. Notes to patient: Last dose given 01/15/2020 at 06:26am   linezolid 600 MG tablet Commonly known as: ZYVOX Take 1 tablet (600 mg total) by mouth once for 1 dose. What changed: when to take this Notes to patient: Not given while in hospital.    metFORMIN 500 MG 24 hr tablet Commonly known as: GLUCOPHAGE-XR Take 500 mg by mouth daily. Notes to patient: Not given while in hospital.    methotrexate 2.5 MG tablet Take 10 mg by mouth once a week. Notes to patient: Not given while in hospital.     mupirocin ointment 2 % Commonly known as: Bactroban Apply 1 application topically 2 (two) times daily. Intranasal - to affected area. Notes to patient: Last dose given 01/15/2020 at 10:32am   oxyCODONE 5 MG immediate release tablet Commonly known as: Oxy IR/ROXICODONE Take 1 tablet (5 mg total) by mouth every 6 (six) hours as needed for severe pain. Must last 30 days Notes to patient: Last dose given 01/14/2020 at 10:46pm   polyethylene glycol 17 g packet Commonly known as: MIRALAX / GLYCOLAX Take 17 g by mouth daily as needed for mild constipation. Notes to patient: Not given while in hospital.   potassium chloride SA 20 MEQ tablet Commonly known as: KLOR-CON TAKE 1 TABLET BY MOUTH ONCE DAILY. What changed:   how much to take  how to take this  when to take this Notes to patient: Last dose given 01/15/2020 at 12:55pm   pramipexole 0.125 MG tablet Commonly known as: MIRAPEX Take 0.25 mg by mouth at bedtime. Notes to patient: Last dose given 01/14/2020 at 10:51pm   predniSONE 10 MG  tablet Commonly known as: DELTASONE Take 10 mg by mouth daily. Notes to patient: Not given while in hospital.    pregabalin 150 MG capsule Commonly known as: LYRICA Take 1 capsule (150 mg total) by  mouth 3 (three) times daily. Notes to patient: Last dose given 01/15/2020 at 08:34am   QUEtiapine 300 MG tablet Commonly known as: SEROQUEL TAKE ONE TABLET BY MOUTH AT BEDTIME. Notes to patient: Last dose given 01/14/2020 at 10:46pm   solifenacin 5 MG tablet Commonly known as: VESICARE Take 5 mg by mouth daily. Notes to patient: Not given while in hospital    Tradjenta 5 MG Tabs tablet Generic drug: linagliptin Take 5 mg by mouth daily. Notes to patient: Not given while in hospital.   Tyler Aas FlexTouch 200 UNIT/ML FlexTouch Pen Generic drug: insulin degludec Inject 22 Units into the skin in the morning and at bedtime. Notes to patient: Not given while in hospital.    Vitamin D 125 MCG (5000 UT) Caps Take 1 capsule by mouth daily. Notes to patient: Last dose given 01/15/2020 at 08:34am   zinc sulfate 220 (50 Zn) MG capsule Take 220 mg by mouth at bedtime. Notes to patient: Not given while in hospital.    zolpidem 5 MG tablet Commonly known as: AMBIEN TAKE ONE TABLET BY MOUTH AT BEDTIME. Notes to patient: Last dose given 01/14/2020 at 10:45pm            Discharge Care Instructions  (From admission, onward)         Start     Ordered   01/15/20 0000  Discharge wound care:       Comments: Per Podiatry Reccs: Can begin daily dressing changes while in house   01/15/20 1157          Allergies  Allergen Reactions  . Cephalexin Hives  . Codeine Palpitations, Nausea Only, Nausea And Vomiting, Rash and Shortness Of Breath    "makes heart fly, she gets flushed and passes out"  . Doxycycline Rash  . Propoxyphene Rash and Shortness Of Breath    Increase heart rate  . Sulfa Antibiotics Palpitations, Nausea Only, Shortness Of Breath and Hives    "makes heart fly, she gets flushed and passes out"  . Lovenox [Enoxaparin Sodium] Hives  . Hydrocodone Nausea And Vomiting    Hear racing & breaks out into a cold sweat.  . Meropenem Rash    Erythematous, hot, pruritic rash  over arms, chest, back, abdomen, and face occurred at the end of meropenem infusion on 02/22/18   Consultations:  Podiatry  Infectious Diseases  Procedures/Studies: CT FOOT LEFT WO CONTRAST  Result Date: 01/10/2020 CLINICAL DATA:  Left foot pain.  Diabetic ulcer.  Foreign body. EXAM: CT OF THE LEFT FOOT WITHOUT CONTRAST TECHNIQUE: Multidetector CT imaging of the left foot was performed according to the standard protocol. Multiplanar CT image reconstructions were also generated. COMPARISON:  X-ray 01/10/2020, 01/22/2014 FINDINGS: Bones/Joint/Cartilage No acute fracture. No dislocation. There is a well circumscribed erosion with partially corticated margins along the plantar surface of the lateral hallux sesamoid (series 6, images 47-48). This finding is new compared to prior foot radiographs from 01/22/2014. Medial hallux sesamoid intact. There is also loss of bony substance along the lateral aspect of the first metatarsal head (series 4, images 104-106). Additional small well-marginated erosion along the plantar-medial cortex of the first metatarsal head (series 4, image 100). There is a small first MTP joint effusion. There is a healed fracture of the fourth digit proximal  phalanx. Ligaments Suboptimally assessed by CT. Muscles and Tendons Fatty atrophy of the intrinsic foot musculature suggestive of chronic denervation changes. Flexor and extensor tendons are grossly intact. Soft tissues Soft tissue ulceration at the plantar aspect of the foot underlying the first metatarsal head. Small ill-defined fluid collection adjacent to the lateral aspect of the first metatarsal head within the first intermetatarsal space measuring approximately 1.2 cm (series 7, image 43; series 5, image 110). Moderate diffuse dorsal soft tissue edema. 7 mm linear radiopaque foreign body is seen within the plantar soft tissues of the forefoot underlying the third MTP joint, just superficial to the third flexor tendon (series 8,  images 40-42). IMPRESSION: 1. Soft tissue ulceration at the plantar aspect of the left foot underlying the first metatarsal head. Erosive changes along the lateral aspect of the first metatarsal head and within the lateral hallux sesamoid, which have partially corticated margins. Findings suggest sequela of osteomyelitis, likely acute on chronic. 2. Small ill-defined fluid collection adjacent to the lateral aspect of the first metatarsal head within the first intermetatarsal space measuring approximately 1.2 cm, concerning for an abscess. 3. Small first MTP joint effusion, which may reflect septic arthritis. 4. 7 mm linear radiopaque foreign body within the plantar soft tissues of the forefoot underlying the third MTP joint. Electronically Signed   By: Davina Poke D.O.   On: 01/10/2020 14:52   US Venous Img Lower Unilateral Left  Result Date: 01/10/2020 CLINICAL DATA:  Left lower extremity pain and swelling EXAM: LEFT LOWER EXTREMITY VENOUS DOPPLER ULTRASOUND TECHNIQUE: Gray-scale sonography with compression, as well as color and duplex ultrasound, were performed to evaluate the deep venous system(s) from the level of the common femoral vein through the popliteal and proximal calf veins. COMPARISON:  None. FINDINGS: VENOUS Normal compressibility of the common femoral, superficial femoral, and popliteal veins, as well as the visualized calf veins. Visualized portions of profunda femoral vein and great saphenous vein unremarkable. No filling defects to suggest DVT on grayscale or color Doppler imaging. Doppler waveforms show normal direction of venous flow, normal respiratory plasticity and response to augmentation. Limited views of the contralateral common femoral vein are unremarkable. OTHER None. Limitations: none IMPRESSION: Negative. Electronically Signed   By: Fidela Salisbury MD   On: 01/10/2020 04:31   US ARTERIAL ABI (SCREENING LOWER EXTREMITY)  Result Date: 01/10/2020 CLINICAL DATA:  Previous  smoker, obesity, diabetes, left leg pain and ulceration EXAM: NONINVASIVE PHYSIOLOGIC VASCULAR STUDY OF BILATERAL LOWER EXTREMITIES TECHNIQUE: Evaluation of both lower extremities were performed at rest, including calculation of ankle-brachial indices with single level Doppler, pressure and pulse volume recording. COMPARISON:  None. FINDINGS: Right ABI:  1.20 Left ABI:  0.92 Right Lower Extremity:  Normal arterial waveforms at the ankle. Left Lower Extremity: Monophasic arterial waveforms at the left ankle 0.9-0.99 Borderline PAD IMPRESSION: Normal right resting ABI Mildly decreased left resting ABI, borderline peripheral arterial disease range Electronically Signed   By: Jerilynn Mages.  Shick M.D.   On: 01/10/2020 11:17   DG Foot Complete Left  Result Date: 01/10/2020 CLINICAL DATA:  Diabetic foot ulcer.  Redness and swelling. EXAM: LEFT FOOT - COMPLETE 3+ VIEW COMPARISON:  01/22/2014. FINDINGS: Diffuse severe soft tissue swelling. Soft tissue ulceration noted along the distal plantar aspect of the left foot. Overlying thin linear metallic density noted at the level of the distal second and third metatarsals along the plantar aspect the foot. This is most likely a foreign body. No acute or focal bony abnormality identified. No bony  erosions are identified. IMPRESSION: 1. Diffuse severe soft tissue swelling. Soft tissue ulceration noted along the distal plantar aspect of the left foot. 2. Overlying thin linear metallic density noted at the level the distal second and third metatarsals along the plantar aspect of the foot suggesting a foreign body. 3.  No acute bony abnormality.  No bony erosions. Electronically Signed   By: Marcello Moores  Register   On: 01/10/2020 12:23   Procedure by Dr. Vickki Muff on 01/11/2020:1. I&D abscess left webspace 2. Excision fibular sesamoid left foot  Subjective: Seen and examined and was doing well. No CP or SOB. Foot felt better. Leg not as erythematous. Stable for D/C and will follow up with PCP,  Podiatry, and ID as an outpatient.  Discharge Exam: Vitals:   01/15/20 0805 01/15/20 1604  BP:  116/65  Pulse: 80 89  Resp: 18 20  Temp:  98 F (36.7 C)  SpO2: 97% 93%   Vitals:   01/14/20 2335 01/15/20 0746 01/15/20 0805 01/15/20 1604  BP: 115/60 (!) 135/54  116/65  Pulse: 85 79 80 89  Resp: _0 Temp: (!) 97.5 F (36.4 C) 98.6 F (37 C)  98 F (36.7 C)  TempSrc: Oral Oral  Oral  SpO2: 100% 97% 97% 93%  Weight:      Height:       General: Pt is alert, awake, not in acute distress Cardiovascular: RRR, S1/S2 +, no rubs, no gallops Respiratory: Diminished bilaterally, no wheezing, no rhonchi; Unlabored breathing but wearing 2 Liters Abdominal: Soft, NT, Diminished, bowel sounds + Extremities: Trace edema, no cyanosis; 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    Microbiology: Recent Results (from the past 240 hour(s))  Blood culture (routine x 2)     Status: None   Collection Time: 01/10/20  2:14 AM   Specimen: BLOOD  Result Value Ref Range Status   Specimen Description BLOOD RIGHT FA  Final   Special Requests   Final    BOTTLES DRAWN AEROBIC AND ANAEROBIC Blood Culture adequate volume   Culture   Final    NO GROWTH 5 DAYS Performed at [facility], [address], [place], [place] [address]    Report Status 01/15/2020 FINAL  Final  Blood culture (routine x 2)     Status: None   Collection Time: 01/10/20  3:54 AM   Specimen: BLOOD  Result Value Ref Range Status   Specimen Description BLOOD RIGHT [facility]  Final   Special Requests   Final    BOTTLES DRAWN AEROBIC AND ANAEROBIC Blood Culture adequate volume   Culture   Final    NO GROWTH 5 DAYS Performed at [facility], [address] [place], [place] [address]    Report Status 01/15/2020 FINAL  Final  Respiratory Panel by  RT PCR (Flu A&B, Covid) - Nasopharyngeal Swab     Status: None   Collection Time: 01/10/20  8:32 AM   Specimen: Nasopharyngeal Swab  Result Value Ref Range Status   SARS Coronavirus 2 by RT PCR NEGATIVE NEGATIVE Final    Comment: (NOTE) SARS-CoV-2 target nucleic acids are NOT DETECTED.  The SARS-CoV-2 RNA is generally detectable in upper respiratoy specimens during the acute phase of infection. The lowest concentration of SARS-CoV-2 viral copies this assay can detect is 131 copies/mL. A negative result does not preclude SARS-Cov-2 infection  and should not be used as the sole basis for treatment or other patient management decisions. A negative result may occur with  improper specimen collection/handling, submission of specimen other than nasopharyngeal swab, presence of viral mutation(s) within the areas targeted by this assay, and inadequate number of viral copies (<131 copies/mL). A negative result must be combined with clinical observations, patient history, and epidemiological information. The expected result is Negative.  Fact Sheet for Patients:  PinkCheek.be  Fact Sheet for Healthcare Providers:  GravelBags.it  This test is no t yet approved or cleared by the [place] FDA and  has been authorized for detection and/or diagnosis of SARS-CoV-2 by FDA under an Emergency Use Authorization (EUA). This EUA will remain  in effect (meaning this test can be used) for the duration of the COVID-19 declaration under Section 564(b)(1) of the Act, 21 U.S.C. section 360bbb-3(b)(1), unless the authorization is terminated or revoked sooner.     Influenza A by PCR NEGATIVE NEGATIVE Final   Influenza B by PCR NEGATIVE NEGATIVE Final    Comment: (NOTE) The Xpert Xpress SARS-CoV-2/FLU/RSV assay is intended as an aid in  the diagnosis of influenza from Nasopharyngeal swab specimens and  should not be used as a sole basis for  treatment. Nasal washings and  aspirates are unacceptable for Xpert Xpress SARS-CoV-2/FLU/RSV  testing.  Fact Sheet for Patients: PinkCheek.be  Fact Sheet for Healthcare Providers: GravelBags.it  This test is not yet approved or cleared by the [place] FDA and  has been authorized for detection and/or diagnosis of SARS-CoV-2 by  FDA under an Emergency Use Authorization (EUA). This EUA will remain  in effect (meaning this test can be used) for the duration of the  Covid-19 declaration under Section 564(b)(1) of the Act, 21  U.S.C. section 360bbb-3(b)(1), unless the authorization is  terminated or revoked. Performed at [facility], [address] [place], [place] [address]   Aerobic/Anaerobic Culture (surgical/deep wound)     Status: None (Preliminary result)   Collection Time: 01/11/20 12:44 PM   Specimen: PATH Other; Tissue  Result Value Ref Range Status   Specimen Description   Final    WOUND Performed at [facility], [address], [place], [place] [address]    Special Requests   Final    LEFT FOOT Performed at [facility], [address] [place], [place] [address]    Gram Stain   Final    NO WBC SEEN NO ORGANISMS SEEN Performed at [facility], [place] [address] [place], [place] [address]    Culture   Final    RARE METHICILLIN RESISTANT STAPHYLOCOCCUS AUREUS RARE GROUP B STREP(S.AGALACTIAE)ISOLATED TESTING AGAINST S. AGALACTIAE NOT ROUTINELY PERFORMED DUE TO PREDICTABILITY OF AMP/PEN/VAN SUSCEPTIBILITY. NO ANAEROBES ISOLATED; CULTURE IN PROGRESS FOR 5 DAYS    Report Status PENDING  Incomplete   Organism ID, Bacteria METHICILLIN RESISTANT STAPHYLOCOCCUS AUREUS  Final      Susceptibility   Methicillin resistant staphylococcus aureus - MIC*    CIPROFLOXACIN >=8 RESISTANT Resistant     ERYTHROMYCIN >=8 RESISTANT Resistant     GENTAMICIN <=0.5 SENSITIVE Sensitive      OXACILLIN >=4 RESISTANT Resistant     TETRACYCLINE <=1 SENSITIVE Sensitive     VANCOMYCIN 1 SENSITIVE Sensitive     TRIMETH/SULFA <=10 SENSITIVE Sensitive     CLINDAMYCIN >=8 RESISTANT Resistant     RIFAMPIN <=0.5 SENSITIVE Sensitive     Inducible Clindamycin NEGATIVE Sensitive     * RARE METHICILLIN  RESISTANT STAPHYLOCOCCUS AUREUS  MRSA PCR Screening     Status: Abnormal   Collection Time: 01/14/20  1:58 PM   Specimen: Nasopharyngeal  Result Value Ref Range Status   MRSA by PCR POSITIVE (A) NEGATIVE Final    Comment:        The GeneXpert MRSA Assay (FDA approved for NASAL specimens only), is one component of a comprehensive MRSA colonization surveillance program. It is not intended to diagnose MRSA infection nor to guide or monitor treatment for MRSA infections. RESULT CALLED TO, READ BACK BY AND VERIFIED WITH: COBLE TERESSA AT 7342 ON 01/14/20 BY SS Performed at [facility], [address] [place], [place] [address]     Labs: BNP (last 3 results) Recent Labs    05/23/19 1929 07/08/19 0014 08/02/19 1502  BNP 19.0 47.0 15.7   Basic Metabolic Panel: Recent Labs  Lab 01/11/20 0420 01/12/20 0518 01/13/20 0500 01/14/20 0453 01/15/20 0410  NA 138 140 141 140 140  K 3.7 3.8 3.9 3.6 3.6  CL 107 110 109 107 109  CO2 _0 GLUCOSE 146* 144* 131* 172* 163*  BUN 22* _1 CREATININE 0.79 0.68 0.74 0.72 0.73  CALCIUM 8.3* 8.5* 8.8* 9.1 9.3  MG 1.8 1.9 1.6* 1.5* 1.7  PHOS 3.0 3.3 4.6 4.3 5.1*   Liver Function Tests: Recent Labs  Lab 01/11/20 0420 01/12/20 0518 01/13/20 0500 01/14/20 0453 01/15/20 0410  AST 16 15 14* 17 14*  ALT _2 ALKPHOS 63 69 71 76 73  BILITOT 0.6 0.6 0.5 0.5 0.5  PROT 6.7 6.5 6.6 6.7 6.6  ALBUMIN 2.7* 2.6* 2.5* 2.6* 2.6*   No results for input(s): LIPASE, AMYLASE in the last 168 hours. No results for input(s): AMMONIA in the last 168 hours. CBC: Recent Labs  Lab 01/11/20 0420 01/12/20 0518  01/13/20 0500 01/14/20 0453 01/15/20 0410  WBC 11.4* 8.9 8.4 8.5 7.6  NEUTROABS 9.0* 6.9 6.1 6.8 5.8  HGB 8.3* 8.1* 8.2* 8.6* 8.2*  HCT 28.8* 28.1* 27.1* 28.1* 28.3*  MCV 73.1* 73.8* 71.7* 70.1* 71.8*  PLT 288 274 302 302 302   Cardiac Enzymes: No results for input(s): CKTOTAL, CKMB, CKMBINDEX, TROPONINI in the last 168 hours. BNP: Invalid input(s): POCBNP CBG: Recent Labs  Lab 01/14/20 1234 01/14/20 1616 01/14/20 2125 01/15/20 0802 01/15/20 1137  GLUCAP 149* 180* 152* 146* 138*   D-Dimer No results for input(s): DDIMER in the last 72 hours. Hgb A1c No results for input(s): HGBA1C in the last 72 hours. Lipid Profile No results for input(s): CHOL, HDL, LDLCALC, TRIG, CHOLHDL, LDLDIRECT in the last 72 hours. Thyroid function studies No results for input(s): TSH, T4TOTAL, T3FREE, THYROIDAB in the last 72 hours.  Invalid input(s): FREET3 Anemia work up No results for input(s): VITAMINB12, FOLATE, FERRITIN, TIBC, IRON, RETICCTPCT in the last 72 hours. Urinalysis    Component Value Date/Time   COLORURINE YELLOW (A) 08/02/2019 1922   APPEARANCEUR CLEAR (A) 08/02/2019 1922   APPEARANCEUR Clear 11/30/2013 1010   LABSPEC 1.006 08/02/2019 1922   LABSPEC 1.015 11/30/2013 1010   PHURINE 7.0 08/02/2019 1922   GLUCOSEU 50 (A) 08/02/2019 1922   GLUCOSEU >=500 11/30/2013 1010   HGBUR NEGATIVE 08/02/2019 1922   BILIRUBINUR NEGATIVE 08/02/2019 1922   BILIRUBINUR Negative 11/30/2013 1010   KETONESUR NEGATIVE 08/02/2019 1922   PROTEINUR NEGATIVE 08/02/2019 1922   UROBILINOGEN 0.2 03/14/2007 1000   NITRITE NEGATIVE 08/02/2019 1922   LEUKOCYTESUR TRACE (  A) 08/02/2019 1922   LEUKOCYTESUR Negative 11/30/2013 1010   Sepsis Labs Invalid input(s): PROCALCITONIN,  WBC,  LACTICIDVEN Microbiology Recent Results (from the past 240 hour(s))  Blood culture (routine x 2)     Status: None   Collection Time: 01/10/20  2:14 AM   Specimen: BLOOD  Result Value Ref Range Status   Specimen  Description BLOOD RIGHT FA  Final   Special Requests   Final    BOTTLES DRAWN AEROBIC AND ANAEROBIC Blood Culture adequate volume   Culture   Final    NO GROWTH 5 DAYS Performed at [facility], [address], [place], [place] [address]    Report Status 01/15/2020 FINAL  Final  Blood culture (routine x 2)     Status: None   Collection Time: 01/10/20  3:54 AM   Specimen: BLOOD  Result Value Ref Range Status   Specimen Description BLOOD RIGHT [facility]  Final   Special Requests   Final    BOTTLES DRAWN AEROBIC AND ANAEROBIC Blood Culture adequate volume   Culture   Final    NO GROWTH 5 DAYS Performed at [facility], [address] [place], [place] [address]    Report Status 01/15/2020 FINAL  Final  Respiratory Panel by RT PCR (Flu A&B, Covid) - Nasopharyngeal Swab     Status: None   Collection Time: 01/10/20  8:32 AM   Specimen: Nasopharyngeal Swab  Result Value Ref Range Status   SARS Coronavirus 2 by RT PCR NEGATIVE NEGATIVE Final    Comment: (NOTE) SARS-CoV-2 target nucleic acids are NOT DETECTED.  The SARS-CoV-2 RNA is generally detectable in upper respiratoy specimens during the acute phase of infection. The lowest concentration of SARS-CoV-2 viral copies this assay can detect is 131 copies/mL. A negative result does not preclude SARS-Cov-2 infection and should not be used as the sole basis for treatment or other patient management decisions. A negative result may occur with  improper specimen collection/handling, submission of specimen other than nasopharyngeal swab, presence of viral mutation(s) within the areas targeted by this assay, and inadequate number of viral copies (<131 copies/mL). A negative result must be combined with clinical observations, patient history, and epidemiological information. The expected result is Negative.  Fact Sheet for Patients:  PinkCheek.be  Fact Sheet for Healthcare Providers:   GravelBags.it  This test is no t yet approved or cleared by the [place] FDA and  has been authorized for detection and/or diagnosis of SARS-CoV-2 by FDA under an Emergency Use Authorization (EUA). This EUA will remain  in effect (meaning this test can be used) for the duration of the COVID-19 declaration under Section 564(b)(1) of the Act, 21 U.S.C. section 360bbb-3(b)(1), unless the authorization is terminated or revoked sooner.     Influenza A by PCR NEGATIVE NEGATIVE Final   Influenza B by PCR NEGATIVE NEGATIVE Final    Comment: (NOTE) The Xpert Xpress SARS-CoV-2/FLU/RSV assay is intended as an aid in  the diagnosis of influenza from Nasopharyngeal swab specimens and  should not be used as a sole basis for treatment. Nasal washings and  aspirates are unacceptable for Xpert Xpress SARS-CoV-2/FLU/RSV  testing.  Fact Sheet for Patients: PinkCheek.be  Fact Sheet for Healthcare Providers: GravelBags.it  This test is not yet approved or cleared by the [place] FDA and  has been authorized for detection and/or diagnosis of SARS-CoV-2 by  FDA under an Emergency Use Authorization (EUA). This EUA will remain  in effect (meaning this test can be  used) for the duration of the  Covid-19 declaration under Section 564(b)(1) of the Act, 21  U.S.C. section 360bbb-3(b)(1), unless the authorization is  terminated or revoked. Performed at [facility], [address] [place], [place] [address]   Aerobic/Anaerobic Culture (surgical/deep wound)     Status: None (Preliminary result)   Collection Time: 01/11/20 12:44 PM   Specimen: PATH Other; Tissue  Result Value Ref Range Status   Specimen Description   Final    WOUND Performed at [facility], [address] [place], [place] [address]    Special Requests   Final    LEFT FOOT Performed at [facility], [address] [place], [place] [address]    Gram Stain   Final    NO WBC SEEN NO ORGANISMS SEEN Performed at [facility], [place] [address], [place], [place] [address]    Culture   Final    RARE METHICILLIN RESISTANT STAPHYLOCOCCUS AUREUS RARE GROUP B STREP(S.AGALACTIAE)ISOLATED TESTING AGAINST S. AGALACTIAE NOT ROUTINELY PERFORMED DUE TO PREDICTABILITY OF AMP/PEN/VAN SUSCEPTIBILITY. NO ANAEROBES ISOLATED; CULTURE IN PROGRESS FOR 5 DAYS    Report Status PENDING  Incomplete   Organism ID, Bacteria METHICILLIN RESISTANT STAPHYLOCOCCUS AUREUS  Final      Susceptibility   Methicillin resistant staphylococcus aureus - MIC*    CIPROFLOXACIN >=8 RESISTANT Resistant     ERYTHROMYCIN >=8 RESISTANT Resistant     GENTAMICIN <=0.5 SENSITIVE Sensitive     OXACILLIN >=4 RESISTANT Resistant     TETRACYCLINE <=1 SENSITIVE Sensitive     VANCOMYCIN 1 SENSITIVE Sensitive     TRIMETH/SULFA <=10 SENSITIVE Sensitive     CLINDAMYCIN >=8 RESISTANT Resistant     RIFAMPIN <=0.5 SENSITIVE Sensitive     Inducible Clindamycin NEGATIVE Sensitive     * RARE METHICILLIN RESISTANT STAPHYLOCOCCUS AUREUS  MRSA PCR Screening     Status: Abnormal   Collection Time: 01/14/20  1:58 PM   Specimen: Nasopharyngeal  Result Value Ref Range Status   MRSA by PCR POSITIVE (A) NEGATIVE Final    Comment:        The GeneXpert MRSA Assay (FDA approved for NASAL specimens only), is one component of a comprehensive MRSA colonization surveillance program. It is not intended to diagnose MRSA infection nor to guide or monitor treatment for MRSA infections. RESULT CALLED TO, READ BACK BY AND VERIFIED WITH: COBLE TERESSA AT 6222 ON 01/14/20 BY SS Performed at [facility], [address] [place], [place] [address]    Time coordinating discharge: 35 minutes  SIGNED:  Kerney Elbe, DO [facility] 01/15/2020, 6:49 PM Pager is on [place]  If 7PM-7AM, please contact night-coverage [email or web address]

## 2020-01-15 NOTE — Progress Notes (Addendum)
Pharmacy Antibiotic Note  [name] [name] is a 59 y.o. female admitted on 01/10/2020 with cellulitis (complicated by foot abscess and osteomyelitis of the first metatarsal). 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 Pharmacy has been consulted for Vancomycin dosing. Status post I&D of abscess and excision of the fibular sesamoid on 9/24.  Vancomycin was given at 2026 yesterday (was due prior to 6th dose at 1900) and the Vancomycin trough was obtained at 1831 (on time).  Vancomycin trough is 18 mcg/mL (trough goal 15-20 mcg/mL).  Plan: Given patient's complicated cellulitis/osteomyelitis, will continue Vancomycin 1500mg  IV every 12 hours.   Continue to monitor renal function and adjust dose as needed.     Height: 5\' 6"  (167.6 cm) Weight: (!) 145.2 kg (320 lb) IBW/kg (Calculated) : 59.3  Temp (24hrs), Avg:98.2 F (36.8 C), Min:97.5 F (36.4 C), Max:98.6 F (37 C)  Recent Labs  Lab 01/09/20 2224 01/10/20 0214 01/10/20 0354 01/10/20 0541 01/10/20 0832 01/10/20 1023 01/11/20 0420 01/12/20 0518 01/13/20 0500 01/14/20 0453 01/14/20 1831 01/15/20 0410  WBC   < >  --   --  16.3*  --    < > 11.4* 8.9 8.4 8.5  --  7.6  CREATININE   < >  --   --  1.35*  --    < > 0.79 0.68 0.74 0.72  --  0.73  LATICACIDVEN  --  1.9 1.3 1.1 1.3  --   --   --   --   --   --   --   VANCOTROUGH  --   --   --   --   --   --   --   --   --   --  18  --    < > = values in this interval not displayed.    Estimated Creatinine Clearance: 113.4 mL/min (by C-G formula based on SCr of 0.73 mg/dL).    Allergies  Allergen Reactions  . Cephalexin Hives  . Codeine Palpitations, Nausea Only, Nausea And Vomiting, Rash and Shortness Of Breath    "makes heart fly, she gets flushed and passes out"  . Doxycycline Rash  . Propoxyphene Rash and Shortness Of Breath    Increase heart rate  . Sulfa Antibiotics Palpitations, Nausea Only, Shortness Of Breath and Hives     "makes heart fly, she gets flushed and passes out"  . Lovenox [Enoxaparin Sodium] Hives  . Hydrocodone Nausea And Vomiting    Hear racing & breaks out into a cold sweat.  . Meropenem Rash    Erythematous, hot, pruritic rash over arms, chest, back, abdomen, and face occurred at the end of meropenem infusion on 02/22/18    Antimicrobials this admission: Vancomycin 9/23  >>  aztreonam 9/23 >> 9/23 Ceftriaxone 9/23>>  Microbiology results:  BCx: NG TD WCx: MRSA; rate GBS (S. Agalactiae)   Thank you for allowing pharmacy to be a part of this patient's care.  10/23, PharmD Clinical Pharmacist 01/15/2020 8:24 AM

## 2020-01-15 NOTE — Progress Notes (Signed)
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 To discharging provider:  please sign these orders via discharge navigator,  Select New Orders & click on the button choice - Manage This Unsigned Work.     Thank you for allowing pharmacy to be a part of this patient's care.  03/24/2020, PharmD, BCPS.   Work Cell: [phone] 01/15/2020 11:46 AM

## 2020-01-16 ENCOUNTER — Ambulatory Visit
Admit: 2020-01-16 | Discharge: 2020-01-17 | Payer: MEDICAID | Attending: Foot & Ankle Surgery | Primary: Foot & Ankle Surgery

## 2020-01-16 LAB — AEROBIC/ANAEROBIC CULTURE W GRAM STAIN (SURGICAL/DEEP WOUND): Gram Stain: NONE SEEN

## 2020-01-17 ENCOUNTER — Telehealth: Payer: Self-pay | Admitting: *Deleted

## 2020-01-17 ENCOUNTER — Other Ambulatory Visit: Payer: Self-pay

## 2020-01-17 ENCOUNTER — Ambulatory Visit
Payer: Medicaid Other | Attending: Student in an Organized Health Care Education/Training Program | Admitting: Student in an Organized Health Care Education/Training Program

## 2020-01-17 ENCOUNTER — Ambulatory Visit (HOSPITAL_BASED_OUTPATIENT_CLINIC_OR_DEPARTMENT_OTHER): Payer: Medicaid Other | Admitting: Student in an Organized Health Care Education/Training Program

## 2020-01-17 ENCOUNTER — Encounter: Payer: Self-pay | Admitting: Student in an Organized Health Care Education/Training Program

## 2020-01-17 NOTE — Progress Notes (Signed)
I attempted to call the patient however no response. Voicemail left instructing patient to call front desk office at [phone] to reschedule appointment. -Dr Shiya Fogelman  

## 2020-01-17 NOTE — Progress Notes (Signed)
I attempted to call the patient however no response. Voicemail left instructing patient to call front desk office at [phone] to reschedule appointment. -Dr Foye Damron  

## 2020-01-18 ENCOUNTER — Telehealth: Payer: Self-pay

## 2020-01-18 NOTE — Telephone Encounter (Signed)
Patient is aware of date/time of covid test prior to PFT.  

## 2020-01-21 ENCOUNTER — Other Ambulatory Visit
Admission: RE | Admit: 2020-01-21 | Discharge: 2020-01-21 | Disposition: A | Discharge status: Home / Self Care | Payer: Medicaid Other | Source: Ambulatory Visit | Attending: Internal Medicine | Admitting: Internal Medicine

## 2020-01-21 ENCOUNTER — Other Ambulatory Visit: Payer: Self-pay

## 2020-01-21 ENCOUNTER — Ambulatory Visit: Admit: 2020-01-21 | Payer: MEDICAID | Attending: Nutritionist | Primary: Nutritionist

## 2020-01-21 DIAGNOSIS — Z01812 Encounter for preprocedural laboratory examination: Secondary | ICD-10-CM | POA: Insufficient documentation

## 2020-01-21 DIAGNOSIS — Z20822 Contact with and (suspected) exposure to covid-19: Secondary | ICD-10-CM | POA: Insufficient documentation

## 2020-01-21 LAB — SARS CORONAVIRUS 2 (TAT 6-24 HRS): SARS Coronavirus 2: NEGATIVE

## 2020-01-22 MED ORDER — DEXCOM G6 RECEIVER MISC
0 refills | 0.00000 days | Status: CP
Start: 2020-01-22 — End: ?

## 2020-01-22 MED ORDER — DEXCOM G6 SENSOR DEVICE
11 refills | 0.00000 days | Status: CP
Start: 2020-01-22 — End: ?

## 2020-01-22 MED ORDER — DEXCOM G6 TRANSMITTER DEVICE
3 refills | 0.00000 days | Status: CP
Start: 2020-01-22 — End: ?

## 2020-01-23 ENCOUNTER — Ambulatory Visit: Payer: Medicaid Other | Attending: Internal Medicine

## 2020-01-23 ENCOUNTER — Other Ambulatory Visit: Payer: Self-pay

## 2020-01-23 ENCOUNTER — Emergency Department
Admit: 2020-01-23 | Discharge: 2020-01-24 | Disposition: A | Discharge status: Home / Self Care | Payer: MEDICAID | Attending: Student in an Organized Health Care Education/Training Program

## 2020-01-23 ENCOUNTER — Encounter
Admit: 2020-01-23 | Discharge: 2020-01-24 | Disposition: A | Discharge status: Home / Self Care | Payer: BLUE CROSS/BLUE SHIELD | Attending: Student in an Organized Health Care Education/Training Program

## 2020-01-23 DIAGNOSIS — J8410 Pulmonary fibrosis, unspecified: Secondary | ICD-10-CM | POA: Insufficient documentation

## 2020-01-23 MED ORDER — ALBUTEROL SULFATE (2.5 MG/3ML) 0.083% IN NEBU
2.5000 mg | INHALATION_SOLUTION | Freq: Once | RESPIRATORY_TRACT | Status: AC
  Administered 2020-01-23: 2.5 mg via RESPIRATORY_TRACT
  Filled 2020-01-23: qty 3

## 2020-01-23 MED ORDER — LINEZOLID 600 MG TABLET
ORAL_TABLET | Freq: Two times a day (BID) | ORAL | 0 refills | 7 days | Status: CP
Start: 2020-01-23 — End: 2020-01-30

## 2020-01-24 ENCOUNTER — Other Ambulatory Visit: Payer: Self-pay | Admitting: Psychiatry

## 2020-01-24 LAB — PULMONARY FUNCTION TEST ARMC ONLY
DL/VA % pred: 74 %
DL/VA: 3.1 ml/min/mmHg/L
DLCO unc % pred: 60 %
DLCO unc: 13.29 ml/min/mmHg
FEF 25-75 Post: 2.02 L/sec
FEF 25-75 Pre: 2.6 L/sec
FEF2575-%Change-Post: -22 %
FEF2575-%Pred-Post: 79 %
FEF2575-%Pred-Pre: 101 %
FEV1-%Change-Post: -6 %
FEV1-%Pred-Post: 72 %
FEV1-%Pred-Pre: 78 %
FEV1-Post: 2.03 L
FEV1-Pre: 2.19 L
FEV1FVC-%Change-Post: -1 %
FEV1FVC-%Pred-Pre: 107 %
FEV6-%Change-Post: -6 %
FEV6-%Pred-Post: 69 %
FEV6-%Pred-Pre: 74 %
FEV6-Post: 2.42 L
FEV6-Pre: 2.59 L
FEV6FVC-%Pred-Post: 103 %
FEV6FVC-%Pred-Pre: 103 %
FVC-%Change-Post: -5 %
FVC-%Pred-Post: 67 %
FVC-%Pred-Pre: 71 %
FVC-Post: 2.45 L
Post FEV1/FVC ratio: 83 %
Post FEV6/FVC ratio: 100 %
Pre FEV1/FVC ratio: 84 %
Pre FEV6/FVC Ratio: 100 %
RV % pred: 77 %
RV: 1.58 L
TLC % pred: 82 %
TLC: 4.4 L

## 2020-01-24 MED ORDER — FAMOTIDINE 20 MG TABLET
ORAL_TABLET | Freq: Every day | ORAL | 1 refills | 90.00000 days | Status: CP
Start: 2020-01-24 — End: 2021-01-23

## 2020-01-24 MED ORDER — ZINC SULFATE 50 MG ZINC (220 MG) CAPSULE
ORAL_CAPSULE | Freq: Every day | ORAL | 1 refills | 90 days | Status: CP
Start: 2020-01-24 — End: 2021-01-23

## 2020-01-29 ENCOUNTER — Ambulatory Visit: Payer: Medicaid Other | Admitting: Infectious Diseases

## 2020-01-30 ENCOUNTER — Telehealth: Payer: Self-pay | Admitting: Infectious Diseases

## 2020-01-30 ENCOUNTER — Ambulatory Visit: Admit: 2020-01-30 | Discharge: 2020-01-31 | Payer: MEDICAID

## 2020-01-30 DIAGNOSIS — E139 Other specified diabetes mellitus without complications: Principal | ICD-10-CM

## 2020-01-30 NOTE — Telephone Encounter (Signed)
Called patient today as she had cancelled her ID appt yesterday. She is on Weekly Dalbavancin ( received 2 doses) for MRSA left foot infection for which she underwent I/D and removal of fibular sesamoid - She told me that she has an ID physician and podiatrist at [facility] and is going there tomorrow- She will discuss with them tomorrow and let them decide on further management. Offered patient that she can call my office any time for appt if she wants me to see her. Spoke to her mother as well

## 2020-01-31 ENCOUNTER — Ambulatory Visit: Admit: 2020-01-31 | Discharge: 2020-02-01 | Payer: MEDICAID

## 2020-01-31 DIAGNOSIS — L97423 Non-pressure chronic ulcer of left heel and midfoot with necrosis of muscle: Principal | ICD-10-CM

## 2020-01-31 DIAGNOSIS — E11621 Type 2 diabetes mellitus with foot ulcer: Principal | ICD-10-CM

## 2020-01-31 DIAGNOSIS — M86672 Other chronic osteomyelitis, left ankle and foot: Principal | ICD-10-CM

## 2020-01-31 DIAGNOSIS — L97521 Non-pressure chronic ulcer of other part of left foot limited to breakdown of skin: Principal | ICD-10-CM

## 2020-02-01 ENCOUNTER — Encounter: Admit: 2020-02-01 | Discharge: 2020-03-01 | Payer: MEDICAID

## 2020-02-01 ENCOUNTER — Inpatient Hospital Stay: Admit: 2020-02-01 | Discharge: 2020-03-01 | Payer: MEDICAID

## 2020-02-01 ENCOUNTER — Encounter: Admit: 2020-02-01 | Discharge: 2020-03-01 | Payer: PRIVATE HEALTH INSURANCE

## 2020-02-01 MED ORDER — HELIUM-OXYGEN INHL
Freq: Every evening | NASAL | 0 days
Start: 2020-02-01 — End: 2020-05-30

## 2020-02-07 ENCOUNTER — Other Ambulatory Visit: Payer: Self-pay

## 2020-02-07 ENCOUNTER — Ambulatory Visit (INDEPENDENT_AMBULATORY_CARE_PROVIDER_SITE_OTHER): Payer: Medicaid Other | Admitting: Adult Health

## 2020-02-07 ENCOUNTER — Encounter: Payer: Self-pay | Admitting: Adult Health

## 2020-02-07 VITALS — BP 112/82 | HR 86 | Temp 97.3°F | Ht 66.0 in | Wt 320.0 lb

## 2020-02-07 DIAGNOSIS — L03116 Cellulitis of left lower limb: Secondary | ICD-10-CM

## 2020-02-07 DIAGNOSIS — J849 Interstitial pulmonary disease, unspecified: Secondary | ICD-10-CM | POA: Diagnosis not present

## 2020-02-07 DIAGNOSIS — D649 Anemia, unspecified: Secondary | ICD-10-CM | POA: Insufficient documentation

## 2020-02-07 DIAGNOSIS — J439 Emphysema, unspecified: Secondary | ICD-10-CM | POA: Diagnosis not present

## 2020-02-07 DIAGNOSIS — J9611 Chronic respiratory failure with hypoxia: Secondary | ICD-10-CM | POA: Diagnosis not present

## 2020-02-07 DIAGNOSIS — Z23 Encounter for immunization: Secondary | ICD-10-CM

## 2020-02-07 DIAGNOSIS — J8410 Pulmonary fibrosis, unspecified: Secondary | ICD-10-CM | POA: Diagnosis not present

## 2020-02-07 DIAGNOSIS — Z6841 Body Mass Index (BMI) 40.0 and over, adult: Secondary | ICD-10-CM

## 2020-02-07 NOTE — Assessment & Plan Note (Signed)
Pulmonary function testing shows moderate restriction and a decreased diffusing capacity.  Patient is clinically improved since last visit in April 2021.  She is weaned off of oxygen at rest.  Has minimum symptoms with cough.  And dyspnea has decreased as well.  Patient remains very sedentary and has multiple comorbidities which I suspect contributes to a lot of her deconditioning. We will need to repeat her CT chest we will check a high-resolution CT chest for postinflammatory fibrosis post COVID-19 and pneumonia in December 2020.  She still had residual interstitial opacities on her CT in March.  Although at this time she was also sick with MRSA bacteremia. Of note patient does have underlying rheumatoid arthritis and is also on methotrexate.  Need to keep this in mind as pneumonitis would also be in the differential  Plan  Patient Instructions  Restart Pulmicort nebulizer twice daily Restart DuoNeb nebulizer twice daily  Continue on Oxygen 2  L with activity and At bedtime  .  Activity as tolerated HRCT Chest  Follow up with Primary provider for Anemia .  Flu shot today .  Follow up with Dr. Belia Heman in 3 months and As needed   Please contact office for sooner follow up if symptoms do not improve or worsen or seek emergency care

## 2020-02-07 NOTE — Assessment & Plan Note (Signed)
Patient has chronic anemia.  I recommend she follow-up with her primary care provider for follow-up

## 2020-02-07 NOTE — Assessment & Plan Note (Signed)
Patient is oxygen he has had decreased since last visit.  She has now on oxygen 2 L with activity and at bedtime. Patient does report she has a history of obstructive sleep apnea and intolerant to CPAP. Can discuss on follow-up visit if she needs a repeat sleep study going forward.  Plan  Patient Instructions  Restart Pulmicort nebulizer twice daily Restart DuoNeb nebulizer twice daily  Continue on Oxygen 2  L with activity and At bedtime  .  Activity as tolerated HRCT Chest  Follow up with Primary provider for Anemia .  Flu shot today .  Follow up with Dr. Belia Heman in 3 months and As needed   Please contact office for sooner follow up if symptoms do not improve or worsen or seek emergency care

## 2020-02-07 NOTE — Assessment & Plan Note (Signed)
Left foot cellulitis with MRSA requiring wound VAC.  Patient continue follow-up with her medical team

## 2020-02-07 NOTE — Assessment & Plan Note (Signed)
Pulmonary function testing shows no airflow obstruction.  Patient does have some restrictive lung disease noted which may be secondary to morbid obesity and possibly postinflammatory fibrosis. She is a former smoker.  And is prone to frequent bronchitis which may be secondary to her chronic immunosuppression.  For now we will continue on nebulizers with budesonide and DuoNeb.  Plan  Patient Instructions  Restart Pulmicort nebulizer twice daily Restart DuoNeb nebulizer twice daily  Continue on Oxygen 2  L with activity and At bedtime  .  Activity as tolerated HRCT Chest  Follow up with Primary provider for Anemia .  Flu shot today .  Follow up with Dr. Belia Heman in 3 months and As needed   Please contact office for sooner follow up if symptoms do not improve or worsen or seek emergency care

## 2020-02-07 NOTE — Progress Notes (Signed)
@Patient  ID: , female    DOB: 01-05-1961, 59 y.o.   MRN: [id number]  Chief Complaint  Patient presents with  . Follow-up    Post covid fibrosis     Referring provider: Care, [place] [facility]  HPI: 59 year old female former smoker with multiple medical problems including rheumatoid arthritis on methotrexate seen for pulmonary consult during hospitalization December 2020 for COVID-19 pneumonia and acute respiratory failure with ARDS she had a prolonged hospitalization (required remdesivir and Decadron.,  Discharged on oxygen) Followed by rheumatology on Methotrexate . Patient has DM -on insulin .   TEST/EVENTS :  CT chest May 21, 2010 - for PE.  Linear subsegmental atelectasis/scarring in the lingula and left lower lobe.  CT chest July 09, 2019 - for PE is in both upper lobes and posteriorly in the lung bases. 2D echo December 2020 EF 7075%, grade 1 diastolic dysfunction  Echo July 16, 2019 EF 60 to 65%, right ventricular systolic function is normal, no left atrial thrombus detected.  Hospitalization March 2021 septic shock and MRSA bacteremia, TEE negative for vegetation.  She did require a PICC line and received vancomycin x6 weeks.  She was also noted to have a splenic infarct.  And was treated with heparin and transition to Eliquis.  02/07/2020 Follow up : Post Covid Fibrosis , Oxygen dependent respiratory failure Patient presents for a follow-up visit.  She was last seen in April 2021.  Since last visit patient says she is doing better with breathing .  She says she has been having decreased dyspnea and cough.  She does get winded with heavy activity but is definitely sedentary.  She is currently wheelchair-bound and is unable to bear weight on her left foot.  She has no dyspnea at rest. Last visit patient was started on Pulmicort nebulizer twice daily and DuoNeb nebulizer 3 times daily.  She says she is only using her nebulizers as needed.  She was also continued  on 2 L at rest and 4 L with activity. Has weaned herself off oxygen at rest . Wears with activity at 2l/m  And At bedtime .  She was set up for pulmonary function testing 01/23/20  that showed mild to moderate restriction with no airflow obstruction FEV1 was 78%, ratio 84, FVC 71%, no significant bronchodilator response, DLCO 60%.  No previous pulmonary function testing on file (of note patient is anemic last hemoglobin was 8.2) She says prior to COVID-19 in December 2020 she had no known breathing problems had no formal diagnosis of COPD or asthma.  She was a former smoker Follow-up CT scans last done in March 2021 showed coarse interstitial opacities in both upper lungs and posteriorly in the lung bases..  Patient has multiple medical problems including diabetes, diastolic dysfunction, morbid obesity, C. difficile colitis.  In March 2021 she required prolonged antibiotics due to MRSA bacteremia and septic shock.  Recently she has had a MRSA left leg and foot cellulitis and osteomyelitis.  She is required a wound VAC and was treated with antibiotics.     Allergies  Allergen Reactions  . Cephalexin Hives  . Codeine Palpitations, Nausea Only, Nausea And Vomiting, Rash and Shortness Of Breath    "makes heart fly, she gets flushed and passes out"  . Doxycycline Rash  . Propoxyphene Rash and Shortness Of Breath    Increase heart rate  . Sulfa Antibiotics Palpitations, Nausea Only, Shortness Of Breath and Hives    "makes heart fly, she gets flushed and  passes out"  . Lovenox [Enoxaparin Sodium] Hives  . Hydrocodone Nausea And Vomiting    Hear racing & breaks out into a cold sweat.  . Meropenem Rash    Erythematous, hot, pruritic rash over arms, chest, back, abdomen, and face occurred at the end of meropenem infusion on 02/22/18    Immunization History  Administered Date(s) Administered  . Influenza, Seasonal, Injecte, Preservative Fre 02/04/2005  . Influenza,inj,Quad PF,6+ Mos 04/14/2015,  03/23/2017, 02/07/2018, 02/01/2019, 02/07/2020  . Influenza-Unspecified 02/02/2019  . Pneumococcal Conjugate-13 04/22/2014  . Pneumococcal Polysaccharide-23 03/23/2017, 04/16/2019    Past Medical History:  Diagnosis Date  . Abdominal wall hernia 01/29/2013  . Anxiety   . Arthritis    Rheumatoid  . C. difficile colitis   . Chronic diastolic heart failure (HCC)   . COVID-19 03/23/2019   Diagnosed at [facility] (send-out) on 03/23/2019  . Depression   . Diabetes mellitus    states no meds or diet restrictions  at present  . Diastolic CHF (HCC)   . Esophagitis   . Fluid retention   . GERD (gastroesophageal reflux disease)   . Hiatal hernia   . Hypertension   . Hypokalemia due to loss of potassium 10/21/2015   Overview:  Associated with 3 weeks of diarrhea  And QT prolongation.  . Hypothyroidism   . IBS (irritable bowel syndrome)   . Moderate episode of recurrent major depressive disorder (HCC) 06/03/2004  . Morbid obesity (HCC)   . MRSA (methicillin resistant Staphylococcus aureus) infection 11/2017   left inner thigh abcess  . Neurogenic bladder    has pacemaker  . Neuropathy   . Obesity   . Panic attacks   . Pneumonia due to COVID-19 virus   . Rheumatoid arthritis (HCC)   . Sleep apnea    STATES SEVERE, CANT TOLERATE MASK- LAST STUDY YEARS AGO    Tobacco History: Social History   Tobacco Use  Smoking Status Former Smoker  . Packs/day: 2.00  . Years: 27.00  . Pack years: 54.00  . Types: Cigarettes  . Quit date: 07/30/1999  . Years since quitting: 20.5  Smokeless Tobacco Never Used   Counseling given: Not Answered   Outpatient Medications Prior to Visit  Medication Sig Dispense Refill  . acetaminophen (TYLENOL) 325 MG tablet Take 2 tablets (650 mg total) by mouth every 6 (six) hours as needed for mild pain or moderate pain (or Fever >/= 101). 20 tablet 0  . ALPRAZolam (XANAX) 0.5 MG tablet Take 1 tablet (0.5 mg total) by mouth 2 (two) times daily as needed for anxiety.  8 tablet 0  . ARIPiprazole (ABILIFY) 5 MG tablet TAKE 1 TABLET BY MOUTH ONCE DAILY. (Patient taking differently: Take 5 mg by mouth daily. ) 28 tablet 11  . ascorbic acid (VITAMIN C) 500 MG tablet Take 1 tablet (500 mg total) by mouth daily. 30 tablet 0  . atorvastatin (LIPITOR) 80 MG tablet Take 80 mg by mouth daily.     . budesonide (PULMICORT) 0.5 MG/2ML nebulizer solution Take 2 mLs (0.5 mg total) by nebulization 2 (two) times daily. 120 mL 11  . busPIRone (BUSPAR) 10 MG tablet Take 1 tablet (10 mg total) by mouth 2 (two) times daily. 60 tablet 2  . calcium citrate-vitamin D 500-500 MG-UNIT chewable tablet Chew 0.5 tablets by mouth daily. 30 tablet 0  . Cholecalciferol (VITAMIN D) 125 MCG (5000 UT) CAPS Take 1 capsule by mouth daily.    . famotidine (PEPCID) 20 MG tablet Take 1 tablet (  20 mg total) by mouth daily. 30 tablet 0  . FLUoxetine (PROZAC) 20 MG capsule TAKE (1) CAPSULE BY MOUTH ONCE DAILY. (Patient taking differently: Take 20 mg by mouth daily. ) 28 capsule 11  . folic acid (FOLVITE) 1 MG tablet Take 1 mg by mouth daily.     . furosemide (LASIX) 20 MG tablet TAKE (1) TABLET BY MOUTH DAILY AS NEEDED. 30 tablet 0  . insulin degludec (TRESIBA FLEXTOUCH) 200 UNIT/ML FlexTouch Pen Inject 22 Units into the skin in the morning and at bedtime. 6.6 mL 0  . insulin lispro (HUMALOG) 100 UNIT/ML injection Inject 0.08 mLs (8 Units total) into the skin 3 (three) times daily before meals. 10 mL 11  . ipratropium-albuterol (DUONEB) 0.5-2.5 (3) MG/3ML SOLN Take 3 mLs by nebulization 3 (three) times daily. 360 mL 0  . levothyroxine (SYNTHROID, LEVOTHROID) 88 MCG tablet Take 88 mcg by mouth daily before breakfast.    . metFORMIN (GLUCOPHAGE-XR) 500 MG 24 hr tablet Take 500 mg by mouth daily.    . methotrexate 2.5 MG tablet Take 10 mg by mouth once a week.     . mupirocin ointment (BACTROBAN) 2 % Apply 1 application topically 2 (two) times daily. Intranasal - to affected area. 22 g 0  . polyethylene  glycol (MIRALAX / GLYCOLAX) 17 g packet Take 17 g by mouth daily as needed for mild constipation. 14 each 0  . potassium chloride SA (KLOR-CON) 20 MEQ tablet TAKE 1 TABLET BY MOUTH ONCE DAILY. (Patient taking differently: 10 mEq. ) 30 tablet 0  . pramipexole (MIRAPEX) 0.125 MG tablet Take 0.25 mg by mouth at bedtime.     . predniSONE (DELTASONE) 10 MG tablet Take 10 mg by mouth daily.    . pregabalin (LYRICA) 150 MG capsule Take 1 capsule (150 mg total) by mouth 3 (three) times daily. 90 capsule 5  . QUEtiapine (SEROQUEL) 300 MG tablet TAKE ONE TABLET BY MOUTH AT BEDTIME. 30 tablet 2  . solifenacin (VESICARE) 5 MG tablet Take 5 mg by mouth daily.    . TRADJENTA 5 MG TABS tablet Take 5 mg by mouth daily.    Marland Kitchen zinc sulfate 220 (50 Zn) MG capsule Take 220 mg by mouth at bedtime.     Marland Kitchen zolpidem (AMBIEN) 5 MG tablet TAKE ONE TABLET BY MOUTH AT BEDTIME. 28 tablet 5  . oxyCODONE (OXY IR/ROXICODONE) 5 MG immediate release tablet Take 1 tablet (5 mg total) by mouth every 6 (six) hours as needed for severe pain. Must last 30 days 120 tablet 0   No facility-administered medications prior to visit.     Review of Systems:   Constitutional:   No  weight loss, night sweats,  Fevers, chills,  +fatigue, or  lassitude.  HEENT:   No headaches,  Difficulty swallowing,  Tooth/dental problems, or  Sore throat,                No sneezing, itching, ear ache, nasal congestion, post nasal drip,   CV:  No chest pain,  Orthopnea, PND, swelling in lower extremities, anasarca, dizziness, palpitations, syncope.   GI  No heartburn, indigestion, abdominal pain, nausea, vomiting, diarrhea, change in bowel habits, loss of appetite, bloody stools.   Resp: .  No chest wall deformity  Skin: Left foot ulcer/wound vac  GU: no dysuria, change in color of urine, no urgency or frequency.  No flank pain, no hematuria   MS:  + joint pains  Physical Exam  BP 112/82 (BP  Location: Left Wrist, Patient Position: Sitting, Cuff  Size: Normal)   Pulse 86   Temp (!) 97.3 F (36.3 C) (Temporal)   Ht  (1.676 m)   Wt (!) 320 lb (145.2 kg)   LMP 04/20/2001   SpO2 95%   BMI 51.65 kg/m   GEN: A/Ox3; pleasant , NAD, BMI 51   HEENT:  [place]/AT, , NOSE-clear, THROAT-clear, no lesions, no postnasal drip or exudate noted.   NECK:  Supple w/ fair ROM; no JVD; normal carotid impulses w/o bruits; no thyromegaly or nodules palpated; no lymphadenopathy.    RESP  Clear  P & A; w/o, wheezes/ rales/ or rhonchi. no accessory muscle use, no dullness to percussion  CARD:  RRR, no m/r/g, 1+ peripheral edema, pulses intact, no cyanosis or clubbing.  GI:   Soft & nt; nml bowel sounds; no organomegaly or masses detected.   Musco: Warm bil,  Left foot brace and wound VAC  Neuro: alert, no focal deficits noted.    Skin: Warm, no lesions or rashes    Lab Results:  CBC    Component Value Date/Time   WBC 7.6 01/15/2020 0410   RBC 3.94 01/15/2020 0410   HGB 8.2 (L) 01/15/2020 0410   HGB 12.1 05/27/2014 1439   HCT 28.3 (L) 01/15/2020 0410   HCT 37.5 05/27/2014 1439   PLT 302 01/15/2020 0410   PLT 221 05/27/2014 1439   MCV 71.8 (L) 01/15/2020 0410   MCV 80 05/27/2014 1439   MCH 20.8 (L) 01/15/2020 0410   MCHC 29.0 (L) 01/15/2020 0410   RDW 19.2 (H) 01/15/2020 0410   RDW 17.3 (H) 05/27/2014 1439   LYMPHSABS 1.1 01/15/2020 0410   LYMPHSABS 1.6 12/03/2013 0511   MONOABS 0.5 01/15/2020 0410   MONOABS 0.5 12/03/2013 0511   EOSABS 0.0 01/15/2020 0410   EOSABS 0.0 12/03/2013 0511   BASOSABS 0.0 01/15/2020 0410   BASOSABS 0.1 12/03/2013 0511    BMET BNP  ProBNP No results found for: PROBNP  Imaging:   PFT Results Latest Ref Rng & Units 01/23/2020  FVC-Predicted Pre % 71  FVC-Post L 2.45  FVC-Predicted Post % 67  Pre FEV1/FVC % % 84  Post FEV1/FCV % % 83  FEV1-Pre L 2.19  FEV1-Predicted Pre % 78  FEV1-Post L 2.03  DLCO uncorrected ml/min/mmHg 13.29  DLCO UNC% % 60  DLVA Predicted % 74  TLC L 4.40  TLC %  Predicted % 82  RV % Predicted % 77    No results found for: NITRICOXIDE      Assessment & Plan:   Postinflammatory pulmonary fibrosis (HCC) Pulmonary function testing shows moderate restriction and a decreased diffusing capacity.  Patient is clinically improved since last visit in April 2021.  She is weaned off of oxygen at rest.  Has minimum symptoms with cough.  And dyspnea has decreased as well.  Patient remains very sedentary and has multiple comorbidities which I suspect contributes to a lot of her deconditioning. We will need to repeat her CT chest we will check a high-resolution CT chest for postinflammatory fibrosis post COVID-19 and pneumonia in December 2020.  She still had residual interstitial opacities on her CT in March.  Although at this time she was also sick with MRSA bacteremia. Of note patient does have underlying rheumatoid arthritis and is also on methotrexate.  Need to keep this in mind as pneumonitis would also be in the differential  Plan  Patient Instructions  Restart Pulmicort nebulizer twice daily Restart  DuoNeb nebulizer twice daily  Continue on Oxygen 2  L with activity and At bedtime  .  Activity as tolerated HRCT Chest  Follow up with Primary provider for Anemia .  Flu shot today .  Follow up with Dr. Belia Heman in 3 months and As needed   Please contact office for sooner follow up if symptoms do not improve or worsen or seek emergency care        COPD (chronic obstructive pulmonary disease) (HCC) Pulmonary function testing shows no airflow obstruction.  Patient does have some restrictive lung disease noted which may be secondary to morbid obesity and possibly postinflammatory fibrosis. She is a former smoker.  And is prone to frequent bronchitis which may be secondary to her chronic immunosuppression.  For now we will continue on nebulizers with budesonide and DuoNeb.  Plan  Patient Instructions  Restart Pulmicort nebulizer twice daily Restart DuoNeb  nebulizer twice daily  Continue on Oxygen 2  L with activity and At bedtime  .  Activity as tolerated HRCT Chest  Follow up with Primary provider for Anemia .  Flu shot today .  Follow up with Dr. Belia Heman in 3 months and As needed   Please contact office for sooner follow up if symptoms do not improve or worsen or seek emergency care       Chronic respiratory failure with hypoxia [facility]) Patient is oxygen he has had decreased since last visit.  She has now on oxygen 2 L with activity and at bedtime. Patient does report she has a history of obstructive sleep apnea and intolerant to CPAP. Can discuss on follow-up visit if she needs a repeat sleep study going forward.  Plan  Patient Instructions  Restart Pulmicort nebulizer twice daily Restart DuoNeb nebulizer twice daily  Continue on Oxygen 2  L with activity and At bedtime  .  Activity as tolerated HRCT Chest  Follow up with Primary provider for Anemia .  Flu shot today .  Follow up with Dr. Belia Heman in 3 months and As needed   Please contact office for sooner follow up if symptoms do not improve or worsen or seek emergency care       Morbid obesity with BMI of 50.0-59.9, adult (HCC) Healthy weight loss is encouraged  Lower extremity cellulitis Left foot cellulitis with MRSA requiring wound VAC.  Patient continue follow-up with her medical team  Anemia Patient has chronic anemia.  I recommend she follow-up with her primary care provider for follow-up     Rubye Oaks, NP 02/07/2020

## 2020-02-07 NOTE — Patient Instructions (Addendum)
Restart Pulmicort nebulizer twice daily Restart DuoNeb nebulizer twice daily  Continue on Oxygen 2  L with activity and At bedtime  .  Activity as tolerated HRCT Chest  Follow up with Primary provider for Anemia .  Flu shot today .  Follow up with Dr. Belia Heman in 3 months and As needed   Please contact office for sooner follow up if symptoms do not improve or worsen or seek emergency care

## 2020-02-07 NOTE — Assessment & Plan Note (Signed)
Healthy weight loss is encouraged 

## 2020-02-13 ENCOUNTER — Ambulatory Visit
Admit: 2020-02-13 | Discharge: 2020-02-14 | Payer: MEDICAID | Attending: Foot & Ankle Surgery | Primary: Foot & Ankle Surgery

## 2020-02-13 DIAGNOSIS — L03119 Cellulitis of unspecified part of limb: Principal | ICD-10-CM

## 2020-02-13 DIAGNOSIS — Z189 Retained foreign body fragments, unspecified material: Principal | ICD-10-CM

## 2020-02-13 DIAGNOSIS — E11621 Type 2 diabetes mellitus with foot ulcer: Principal | ICD-10-CM

## 2020-02-13 DIAGNOSIS — L97522 Non-pressure chronic ulcer of other part of left foot with fat layer exposed: Principal | ICD-10-CM

## 2020-02-13 DIAGNOSIS — Z9889 Other specified postprocedural states: Principal | ICD-10-CM

## 2020-02-14 ENCOUNTER — Encounter: Payer: Self-pay | Admitting: Student in an Organized Health Care Education/Training Program

## 2020-02-14 ENCOUNTER — Other Ambulatory Visit: Payer: Self-pay

## 2020-02-14 ENCOUNTER — Ambulatory Visit
Payer: Medicaid Other | Attending: Student in an Organized Health Care Education/Training Program | Admitting: Student in an Organized Health Care Education/Training Program

## 2020-02-14 VITALS — BP 124/73 | HR 90 | Temp 97.4°F | Resp 16 | Ht 66.0 in | Wt 320.0 lb

## 2020-02-14 DIAGNOSIS — M25529 Pain in unspecified elbow: Secondary | ICD-10-CM | POA: Diagnosis not present

## 2020-02-14 DIAGNOSIS — M05711 Rheumatoid arthritis with rheumatoid factor of right shoulder without organ or systems involvement: Secondary | ICD-10-CM | POA: Insufficient documentation

## 2020-02-14 DIAGNOSIS — M792 Neuralgia and neuritis, unspecified: Secondary | ICD-10-CM | POA: Diagnosis not present

## 2020-02-14 DIAGNOSIS — G894 Chronic pain syndrome: Secondary | ICD-10-CM | POA: Insufficient documentation

## 2020-02-14 DIAGNOSIS — E1142 Type 2 diabetes mellitus with diabetic polyneuropathy: Secondary | ICD-10-CM | POA: Diagnosis not present

## 2020-02-14 DIAGNOSIS — M25531 Pain in right wrist: Secondary | ICD-10-CM | POA: Insufficient documentation

## 2020-02-14 DIAGNOSIS — M25532 Pain in left wrist: Secondary | ICD-10-CM | POA: Diagnosis present

## 2020-02-14 DIAGNOSIS — M059 Rheumatoid arthritis with rheumatoid factor, unspecified: Secondary | ICD-10-CM | POA: Diagnosis present

## 2020-02-14 DIAGNOSIS — M05712 Rheumatoid arthritis with rheumatoid factor of left shoulder without organ or systems involvement: Secondary | ICD-10-CM | POA: Diagnosis present

## 2020-02-14 DIAGNOSIS — M79673 Pain in unspecified foot: Secondary | ICD-10-CM | POA: Insufficient documentation

## 2020-02-14 DIAGNOSIS — G8929 Other chronic pain: Secondary | ICD-10-CM

## 2020-02-14 MED ORDER — OXYCODONE-ACETAMINOPHEN 5-325 MG PO TABS
1.0000 | ORAL_TABLET | Freq: Four times a day (QID) | ORAL | 0 refills | Status: AC | PRN
Start: 2020-03-15 — End: 2020-04-14

## 2020-02-14 MED ORDER — OXYCODONE-ACETAMINOPHEN 5-325 MG PO TABS
1.0000 | ORAL_TABLET | Freq: Four times a day (QID) | ORAL | 0 refills | Status: AC | PRN
Start: 2020-02-14 — End: 2020-03-15

## 2020-02-14 MED ORDER — OXYCODONE-ACETAMINOPHEN 5-325 MG PO TABS
1.0000 | ORAL_TABLET | Freq: Four times a day (QID) | ORAL | 0 refills | Status: AC | PRN
Start: 2020-04-14 — End: 2020-05-14

## 2020-02-14 MED ORDER — OXYCODONE-ACETAMINOPHEN 5 MG-325 MG TABLET
Freq: Four times a day (QID) | ORAL | 0.00000 days | Status: SS | PRN
Start: 2020-02-14 — End: 2020-05-14

## 2020-02-14 NOTE — Progress Notes (Signed)
Nursing Pain Medication Assessment:  Safety precautions to be maintained throughout the outpatient stay will include: orient to surroundings, keep bed in low position, maintain call bell within reach at all times, provide assistance with transfer out of bed and ambulation.  Medication Inspection Compliance: Pill count conducted under aseptic conditions, in front of the patient. Neither the pills nor the bottle was removed from the patient's sight at any time. Once count was completed pills were immediately returned to the patient in their original bottle.  Medication: Oxycodone IR Pill/Patch Count: 0 of 120 pills remain Pill/Patch Appearance: Markings consistent with prescribed medication Bottle Appearance: Standard pharmacy container. Clearly labeled. Filled Date:12/26/2019 Last Medication intake:  Today

## 2020-02-14 NOTE — Progress Notes (Signed)
PROVIDER NOTE: Information contained herein reflects review and annotations entered in association with encounter. Interpretation of such information and data should be left to medically-trained personnel. Information provided to patient can be located elsewhere in the medical record under "Patient Instructions". Document created using STT-dictation technology, any transcriptional errors that may result from process are unintentional.    Patient: [name] [name]  Service Category: E/M  Provider: Gillis Santa, MD  DOB: [birth year]  DOS: 02/14/2020  Specialty: Interventional Pain Management  MRN: [id number]  Setting: Ambulatory outpatient  PCP: [facility], [facility]  Type: Established Patient    Referring Provider: [facility]  Location: Office  Delivery: Face-to-face     HPI  [name] [name], a 59 y.o. year old female, is here today because of her Diabetic peripheral neuropathy ([place]) [E11.42]. [name] [name] primary complain today is Pain ("all over" due to rheumatoid arthritis) Last encounter: My last encounter with her was on 12/11/2019. Pertinent problems: [name] [name] has Chronic wrist pain (Secondary area of Pain) (Bilateral) (L>R); Chronic knee pain (Bilateral) (R>L); Long term current use of opiate analgesic; Opiate use (30 MME/Day); Neurogenic pain; Neuropathic pain; Diabetic peripheral neuropathy ([place]); Osteoarthritis, multiple sites; Chronic foot pain (Primary Area of Pain) (Bilateral) (L>R); Chronic elbow pain (Third area of Pain) (Bilateral) (L>R); Chronic upper back pain; Chronic pain syndrome; Rheumatoid arthritis involving multiple sites with positive rheumatoid factor ([place]); CKD (chronic kidney disease), stage III ([place]); Morbid obesity with BMI of 50.0-59.9, adult ([place]); and Severe sepsis with septic shock ([place]) on their pertinent problem list. Pain Assessment: Severity of Chronic pain is reported as a 2 /10. Location: Other (Comment)  /denies. Onset: More than a month ago. Quality:  Sharp. Timing: Constant. Modifying factor(s): medicaitons. Vitals:  height is 5' 6"  (1.676 m) and weight is 320 lb (145.2 kg) (abnormal). Her temporal temperature is 97.4 F (36.3 C) (abnormal). Her blood pressure is 124/73 and her pulse is 90. Her respiration is 16 and oxygen saturation is 96%.   Reason for encounter: medication management.   Patient follows up today for medication management.  Of note she had an ulcer of her left foot and went on to have left foot debridement.  She has diabetic peripheral neuropathy.  She states that her A1c has significantly reduced.  She has reduced sugary intake.  She is also eating better.  She is on Ambien and Lyrica.  I expressed my concern regarding her being on multiple sedative medications in the context of her morbid obesity.  Patient states that these medications do help her function and reduce her pain so that she can perform ADLs.  I informed her that as her left foot ulcer pain heals, we will plan on reducing her chronic opioid dose.  She is currently on oxycodone 5 mg 4 times daily as needed.  We discussed transitioning to Percocet to hopefully obtain better analgesic benefit with acetaminophen.  When she follows up again.  We will likely reduce her monthly quantity to 90.  Patient endorsed understanding.  She continues Lyrica as prescribed at 150 mg 3 times a day.  Pharmacotherapy Assessment   Analgesic: Transition from oxycodone 5 mg 4 times daily as needed to Percocet 5 mg 4 times daily as needed, quantity 120/month.  We will plan on weaning to quantity 90 at next visit after her acute on chronic left foot pain from surgery heals.   Monitoring: [place] PMP: PDMP not reviewed this encounter.       Pharmacotherapy: No side-effects or  adverse reactions reported. Compliance: No problems identified. Effectiveness: Clinically acceptable.  Landis Martins, RN  02/14/2020  2:23 PM  Sign when Signing Visit Nursing Pain Medication Assessment:  Safety  precautions to be maintained throughout the outpatient stay will include: orient to surroundings, keep bed in low position, maintain call bell within reach at all times, provide assistance with transfer out of bed and ambulation.  Medication Inspection Compliance: Pill count conducted under aseptic conditions, in front of the patient. Neither the pills nor the bottle was removed from the patient's sight at any time. Once count was completed pills were immediately returned to the patient in their original bottle.  Medication: Oxycodone IR Pill/Patch Count: 0 of 120 pills remain Pill/Patch Appearance: Markings consistent with prescribed medication Bottle Appearance: Standard pharmacy container. Clearly labeled. Filled Date:12/26/2019 Last Medication intake:  Today    UDS:  Summary  Date Value Ref Range Status  12/11/2019 Note  Final    Comment:    ==================================================================== [facility] 13 (MW) ==================================================================== Test                             Result       Flag       Units  Drug Present not Declared for Prescription Verification   Oxycodone                      1378         UNEXPECTED ng/mg creat   Oxymorphone                    410          UNEXPECTED ng/mg creat   Noroxycodone                   2537         UNEXPECTED ng/mg creat    Sources of oxycodone include scheduled prescription medications.    Oxymorphone and noroxycodone are expected metabolites of oxycodone.    Oxymorphone is also available as a scheduled prescription medication.  Drug Absent but Declared for Prescription Verification   Alprazolam                     Not Detected UNEXPECTED ng/mg creat ==================================================================== Test                      Result    Flag   Units      Ref Range   Creatinine              63               mg/dL       >=20 ==================================================================== Declared Medications:  The flagging and interpretation on this report are based on the  following declared medications.  Unexpected results may arise from  inaccuracies in the declared medications.   **Note: The testing scope of this panel includes these medications:   Alprazolam   **Note: The testing scope of this panel does not include the  following reported medications:   Albuterol (Duoneb)  Aripiprazole (Abilify)  Atorvastatin  Budesonide (Pulmicort)  Buspirone (Buspar)  Calcium  Cholecalciferol  Famotidine  Fluoxetine (Prozac)  Folic Acid  Furosemide  Insulin Tyler Aas)  Ipratropium (Duoneb)  Levothyroxine  Polyethylene Glycol (MiraLAX)  Potassium (Klor-Con)  Pramipexole (Mirapex)  Pregabalin (Lyrica)  Quetiapine (Seroquel)  Topical  Vitamin C  Vitamin D  Zolpidem (Ambien) ==================================================================== For clinical consultation, please call [phone]. ====================================================================      ROS  Constitutional: Denies any fever or chills Gastrointestinal: No reported hemesis, hematochezia, vomiting, or acute GI distress Musculoskeletal: Low back, polyarthralgias, left ankle pain, left foot pain Neurological: No reported episodes of acute onset apraxia, aphasia, dysarthria, agnosia, amnesia, paralysis, loss of coordination, or loss of consciousness  Medication Review  ALPRAZolam, ARIPiprazole, FLUoxetine, QUEtiapine, Vitamin D, acetaminophen, apixaban, ascorbic acid, atorvastatin, budesonide, busPIRone, calcium citrate-vitamin D, famotidine, folic acid, furosemide, insulin degludec, insulin lispro, ipratropium-albuterol, levothyroxine, linagliptin, metFORMIN, methotrexate, mupirocin ointment, oxyCODONE-acetaminophen, polyethylene glycol, potassium chloride SA, pramipexole, predniSONE, pregabalin, solifenacin, zinc  sulfate, and zolpidem  History Review  Allergy: [name] [name] is allergic to cephalexin, codeine, doxycycline, propoxyphene, sulfa antibiotics, lovenox [enoxaparin sodium], hydrocodone, and meropenem. Drug: [name] [name]  reports no history of drug use. Alcohol:  reports no history of alcohol use. Tobacco:  reports that she quit smoking about 20 years ago. Her smoking use included cigarettes. She has a 54.00 pack-year smoking history. She has never used smokeless tobacco. Social: [name] [name]  reports that she quit smoking about 20 years ago. Her smoking use included cigarettes. She has a 54.00 pack-year smoking history. She has never used smokeless tobacco. She reports that she does not drink alcohol and does not use drugs. Medical:  has a past medical history of Abdominal wall hernia (01/29/2013), Anxiety, Arthritis, C. difficile colitis, Chronic diastolic heart failure ([place]), COVID-19 (03/23/2019), Depression, Diabetes mellitus, Diastolic CHF ([place]), Esophagitis, Fluid retention, GERD (gastroesophageal reflux disease), Hiatal hernia, Hypertension, Hypokalemia due to loss of potassium (10/21/2015), Hypothyroidism, IBS (irritable bowel syndrome), Moderate episode of recurrent major depressive disorder ([place]) (06/03/2004), Morbid obesity ([place]), MRSA (methicillin resistant Staphylococcus aureus) infection (11/2017), Neurogenic bladder, Neuropathy, Obesity, Panic attacks, Pneumonia due to COVID-19 virus, Rheumatoid arthritis ([place]), and Sleep apnea. Surgical: [name] [name]  has a past surgical history that includes Tubal ligation; Tonsillectomy; Cholecystectomy; Abdominal hysterectomy; Laparoscopic gastric banding (03/20/07); Eye surgery; Hernia repair; DG GREAT TOE RIGHT FOOT (02/23/2018); TEE without cardioversion (N/A, 07/16/2019); and Irrigation and debridement foot (Left, 01/11/2020). Family: family history includes Alcohol abuse in her father and sister; Anxiety disorder in her father, sister, and sister; Bipolar disorder in  her father and sister; Depression in her father, sister, and sister; Drug abuse in her sister; Heart attack in her brother; Heart attack (age of onset: 85) in her brother; Heart disease in her brother; Heart failure in her father.  Laboratory Chemistry Profile   Renal Lab Results  Component Value Date   BUN 12 01/15/2020   CREATININE 0.73 01/15/2020   BCR 16 11/16/2018   GFRAA >60 01/15/2020   GFRNONAA >60 01/15/2020     Hepatic Lab Results  Component Value Date   AST 14 (L) 01/15/2020   ALT 14 01/15/2020   ALBUMIN 2.6 (L) 01/15/2020   ALKPHOS 73 01/15/2020   LIPASE 176 (H) 07/13/2019     Electrolytes Lab Results  Component Value Date   NA 140 01/15/2020   K 3.6 01/15/2020   CL 109 01/15/2020   CALCIUM 9.3 01/15/2020   MG 1.7 01/15/2020   PHOS 5.1 (H) 01/15/2020     Bone Lab Results  Component Value Date   VD25OH 37.36 04/30/2019   25OHVITD1 23 (L) 11/16/2018   25OHVITD2 2.2 11/16/2018   25OHVITD3 21 11/16/2018     Inflammation (CRP: Acute Phase) (ESR: Chronic Phase) Lab Results  Component Value Date   CRP 2.1 (H) 07/17/2019   ESRSEDRATE 65 (H)  07/17/2019   LATICACIDVEN 1.3 01/10/2020       Note: Above Lab results reviewed.  Recent Imaging Review  Pulmonary Function Test [facility] Only Spirometry Data Is Acceptable and Reproducible  No obvious evidence of Obstructive Airways disease or Restrictive Lung  disease  Consider outpatient follow up with Pulmonary if needed.  Clinical Correlation Advised  RECOMMEND WEIGHT LOSS Note: Reviewed        Physical Exam  General appearance: Well nourished, well developed, and well hydrated. In no apparent acute distress Mental status: Alert, oriented x 3 (person, place, & time)       Respiratory: No evidence of acute respiratory distress Eyes: PERLA Vitals: BP 124/73   Pulse 90   Temp (!) 97.4 F (36.3 C) (Temporal)   Resp 16   Ht 5' 6"  (1.676 m)   Wt (!) 320 lb (145.2 kg)   LMP 04/20/2001   SpO2 96%   BMI  51.65 kg/m  BMI: Estimated body mass index is 51.65 kg/m as calculated from the following:   Height as of this encounter: 5' 6"  (1.676 m).   Weight as of this encounter: 320 lb (145.2 kg). Ideal: Ideal body weight: 59.3 kg (130 lb 11.7 oz) Adjusted ideal body weight: 93.6 kg (206 lb 7 oz)  Lumbar Spine Area Exam  Skin & Axial Inspection: No masses, redness, or swelling Alignment: Symmetrical Functional ROM: Pain restricted ROM       Stability: No instability detected Muscle Tone/Strength: Functionally intact. No obvious neuro-muscular anomalies detected. Sensory (Neurological): Musculoskeletal pain pattern  Gait & Posture Assessment  Ambulation: Patient came in today in a wheel chair Gait: Relatively normal for age and body habitus Posture: WNL  Lower Extremity Exam    Side: Right lower extremity  Side: Left lower extremity  Stability: No instability observed          Stability: No instability observed          Skin & Extremity Inspection: Skin color, temperature, and hair growth are WNL. No peripheral edema or cyanosis. No masses, redness, swelling, asymmetry, or associated skin lesions. No contractures.  Skin & Extremity Inspection: Left foot in boot with gauze bandage around foot.  Functional ROM: Pain restricted ROM for hip and knee joints          Functional ROM: Pain restricted ROM for hip and knee joints also pain restricted range of motion for the left foot          Muscle Tone/Strength: Functionally intact. No obvious neuro-muscular anomalies detected.  Muscle Tone/Strength: Functionally intact. No obvious neuro-muscular anomalies detected.  Sensory (Neurological): Musculoskeletal pain pattern        Sensory (Neurological): Musculoskeletal pain pattern        DTR: Patellar: deferred today Achilles: deferred today Plantar: deferred today  DTR: Patellar: deferred today Achilles: deferred today Plantar: deferred today  Palpation: No palpable anomalies  Palpation:  No palpable anomalies     Assessment   Status Diagnosis  Persistent Persistent Persistent 1. Diabetic peripheral neuropathy ([place])   2. Neurogenic pain   3. Chronic elbow pain (Third area of Pain) (Bilateral) (L>R)   4. Rheumatoid arthritis involving both shoulders with positive rheumatoid factor (HCC)   5. Chronic wrist pain (Secondary area of Pain) (Bilateral) (L>R)   6. Chronic foot pain (Primary Area of Pain) (Bilateral) (L>R)   7. Neuropathic pain   8. Rheumatoid arthritis with positive rheumatoid factor, involving unspecified site ([place])   9. Chronic pain syndrome  Plan of Care   [name] [name] has a current medication list which includes the following long-term medication(s): aripiprazole, budesonide, famotidine, fluoxetine, furosemide, insulin lispro, ipratropium-albuterol, potassium chloride sa, pregabalin, zolpidem, calcium citrate-vitamin d, quetiapine, and [DISCONTINUED] apixaban.  1.  Discussed the importance of optimizing blood sugar control.  Stressed the importance of healthy eating, dieting, physical exercise 2.  Transition from oxycodone to Percocet as below.  Informed patient that we will be weaning in the future. 3.  Continue Lyrica as prescribed. 4.  Encourage patient to find an alternative to Ambien.  Pharmacotherapy (Medications Ordered): Meds ordered this encounter  Medications  . oxyCODONE-acetaminophen (PERCOCET) 5-325 MG tablet    Sig: Take 1 tablet by mouth every 6 (six) hours as needed for severe pain. Must last 30 days.    Dispense:  120 tablet    Refill:  0    Chronic Pain. (STOP Act - Not applicable). Fill one day early if closed on scheduled refill date.  Marland Kitchen oxyCODONE-acetaminophen (PERCOCET) 5-325 MG tablet    Sig: Take 1 tablet by mouth every 6 (six) hours as needed for severe pain. Must last 30 days.    Dispense:  120 tablet    Refill:  0    Chronic Pain. (STOP Act - Not applicable). Fill one day early if closed on scheduled refill  date.  Marland Kitchen oxyCODONE-acetaminophen (PERCOCET) 5-325 MG tablet    Sig: Take 1 tablet by mouth every 6 (six) hours as needed for severe pain. Must last 30 days.    Dispense:  120 tablet    Refill:  0    Chronic Pain. (STOP Act - Not applicable). Fill one day early if closed on scheduled refill date.    Follow-up plan:   Return in about 3 months (around 05/16/2020) for Medication Management, in person.   Recent Visits Date Type Provider Dept  12/11/19 Office Visit Gillis Santa, MD [facility]  11/19/19 Office Visit Milinda Pointer, MD [facility]  Showing recent visits within past 90 days and meeting all other requirements Today's Visits Date Type Provider Dept  02/14/20 Office Visit Gillis Santa, MD [facility]  Showing today's visits and meeting all other requirements Future Appointments Date Type Provider Dept  05/13/20 Appointment Gillis Santa, MD [facility]  Showing future appointments within next 90 days and meeting all other requirements  I discussed the assessment and treatment plan with the patient. The patient was provided an opportunity to ask questions and all were answered. The patient agreed with the plan and demonstrated an understanding of the instructions.  Patient advised to call back or seek an in-person evaluation if the symptoms or condition worsens.  Duration of encounter: 30 minutes.  Note by: Gillis Santa, MD Date: 02/14/2020; Time: 3:15 PM

## 2020-02-18 ENCOUNTER — Ambulatory Visit
Admit: 2020-02-18 | Discharge: 2020-02-19 | Payer: PRIVATE HEALTH INSURANCE | Attending: Rheumatology | Primary: Rheumatology

## 2020-02-18 DIAGNOSIS — G894 Chronic pain syndrome: Principal | ICD-10-CM

## 2020-02-18 DIAGNOSIS — E1169 Type 2 diabetes mellitus with other specified complication: Principal | ICD-10-CM

## 2020-02-18 DIAGNOSIS — E559 Vitamin D deficiency, unspecified: Principal | ICD-10-CM

## 2020-02-18 DIAGNOSIS — Z6837 Body mass index (BMI) 37.0-37.9, adult: Principal | ICD-10-CM

## 2020-02-18 DIAGNOSIS — L97528 Non-pressure chronic ulcer of other part of left foot with other specified severity: Principal | ICD-10-CM

## 2020-02-18 DIAGNOSIS — E11621 Type 2 diabetes mellitus with foot ulcer: Principal | ICD-10-CM

## 2020-02-18 DIAGNOSIS — K219 Gastro-esophageal reflux disease without esophagitis: Principal | ICD-10-CM

## 2020-02-18 DIAGNOSIS — E039 Hypothyroidism, unspecified: Principal | ICD-10-CM

## 2020-02-18 DIAGNOSIS — M0579 Rheumatoid arthritis with rheumatoid factor of multiple sites without organ or systems involvement: Principal | ICD-10-CM

## 2020-02-18 DIAGNOSIS — Z7951 Long term (current) use of inhaled steroids: Principal | ICD-10-CM

## 2020-02-18 DIAGNOSIS — L97423 Non-pressure chronic ulcer of left heel and midfoot with necrosis of muscle: Principal | ICD-10-CM

## 2020-02-18 DIAGNOSIS — E1142 Type 2 diabetes mellitus with diabetic polyneuropathy: Principal | ICD-10-CM

## 2020-02-18 DIAGNOSIS — Z794 Long term (current) use of insulin: Principal | ICD-10-CM

## 2020-02-18 DIAGNOSIS — M86672 Other chronic osteomyelitis, left ankle and foot: Principal | ICD-10-CM

## 2020-02-18 DIAGNOSIS — I5032 Chronic diastolic (congestive) heart failure: Principal | ICD-10-CM

## 2020-02-18 DIAGNOSIS — G4733 Obstructive sleep apnea (adult) (pediatric): Principal | ICD-10-CM

## 2020-02-18 DIAGNOSIS — Z7952 Long term (current) use of systemic steroids: Principal | ICD-10-CM

## 2020-02-18 DIAGNOSIS — N3946 Mixed incontinence: Principal | ICD-10-CM

## 2020-02-18 DIAGNOSIS — F319 Bipolar disorder, unspecified: Principal | ICD-10-CM

## 2020-02-18 DIAGNOSIS — J449 Chronic obstructive pulmonary disease, unspecified: Principal | ICD-10-CM

## 2020-02-18 DIAGNOSIS — Z7984 Long term (current) use of oral hypoglycemic drugs: Principal | ICD-10-CM

## 2020-02-18 DIAGNOSIS — L97521 Non-pressure chronic ulcer of other part of left foot limited to breakdown of skin: Principal | ICD-10-CM

## 2020-02-18 DIAGNOSIS — F419 Anxiety disorder, unspecified: Principal | ICD-10-CM

## 2020-02-18 DIAGNOSIS — I110 Hypertensive heart disease with heart failure: Principal | ICD-10-CM

## 2020-02-18 MED ORDER — FOLIC ACID 1 MG TABLET
ORAL_TABLET | Freq: Every day | ORAL | 3 refills | 90.00000 days | Status: CP
Start: 2020-02-18 — End: ?

## 2020-02-18 MED ORDER — METHOTREXATE SODIUM 2.5 MG TABLET
ORAL_TABLET | ORAL | 2 refills | 14.00000 days | Status: CP
Start: 2020-02-18 — End: 2020-03-11

## 2020-02-18 MED ORDER — HYDROXYCHLOROQUINE 200 MG TABLET
ORAL_TABLET | Freq: Two times a day (BID) | ORAL | 3 refills | 90.00000 days | Status: CP
Start: 2020-02-18 — End: 2020-03-11

## 2020-02-19 ENCOUNTER — Other Ambulatory Visit: Payer: Self-pay

## 2020-02-19 ENCOUNTER — Ambulatory Visit
Admission: RE | Admit: 2020-02-19 | Discharge: 2020-02-19 | Disposition: A | Discharge status: Home / Self Care | Payer: Medicaid Other | Source: Ambulatory Visit | Attending: Adult Health | Admitting: Adult Health

## 2020-02-19 DIAGNOSIS — J849 Interstitial pulmonary disease, unspecified: Secondary | ICD-10-CM | POA: Diagnosis not present

## 2020-02-20 DIAGNOSIS — L97423 Non-pressure chronic ulcer of left heel and midfoot with necrosis of muscle: Principal | ICD-10-CM

## 2020-02-20 DIAGNOSIS — M86672 Other chronic osteomyelitis, left ankle and foot: Principal | ICD-10-CM

## 2020-02-20 DIAGNOSIS — F419 Anxiety disorder, unspecified: Principal | ICD-10-CM

## 2020-02-20 DIAGNOSIS — Z7984 Long term (current) use of oral hypoglycemic drugs: Principal | ICD-10-CM

## 2020-02-20 DIAGNOSIS — I5032 Chronic diastolic (congestive) heart failure: Principal | ICD-10-CM

## 2020-02-20 DIAGNOSIS — N3946 Mixed incontinence: Principal | ICD-10-CM

## 2020-02-20 DIAGNOSIS — E039 Hypothyroidism, unspecified: Principal | ICD-10-CM

## 2020-02-20 DIAGNOSIS — E11621 Type 2 diabetes mellitus with foot ulcer: Principal | ICD-10-CM

## 2020-02-20 DIAGNOSIS — L97521 Non-pressure chronic ulcer of other part of left foot limited to breakdown of skin: Principal | ICD-10-CM

## 2020-02-20 DIAGNOSIS — E1142 Type 2 diabetes mellitus with diabetic polyneuropathy: Principal | ICD-10-CM

## 2020-02-20 DIAGNOSIS — F319 Bipolar disorder, unspecified: Principal | ICD-10-CM

## 2020-02-20 DIAGNOSIS — M0579 Rheumatoid arthritis with rheumatoid factor of multiple sites without organ or systems involvement: Principal | ICD-10-CM

## 2020-02-20 DIAGNOSIS — Z794 Long term (current) use of insulin: Principal | ICD-10-CM

## 2020-02-20 DIAGNOSIS — G4733 Obstructive sleep apnea (adult) (pediatric): Principal | ICD-10-CM

## 2020-02-20 DIAGNOSIS — L97528 Non-pressure chronic ulcer of other part of left foot with other specified severity: Principal | ICD-10-CM

## 2020-02-20 DIAGNOSIS — Z7952 Long term (current) use of systemic steroids: Principal | ICD-10-CM

## 2020-02-20 DIAGNOSIS — I110 Hypertensive heart disease with heart failure: Principal | ICD-10-CM

## 2020-02-20 DIAGNOSIS — J449 Chronic obstructive pulmonary disease, unspecified: Principal | ICD-10-CM

## 2020-02-20 DIAGNOSIS — E559 Vitamin D deficiency, unspecified: Principal | ICD-10-CM

## 2020-02-20 DIAGNOSIS — Z7951 Long term (current) use of inhaled steroids: Principal | ICD-10-CM

## 2020-02-20 DIAGNOSIS — G894 Chronic pain syndrome: Principal | ICD-10-CM

## 2020-02-20 DIAGNOSIS — E1169 Type 2 diabetes mellitus with other specified complication: Principal | ICD-10-CM

## 2020-02-20 DIAGNOSIS — K219 Gastro-esophageal reflux disease without esophagitis: Principal | ICD-10-CM

## 2020-02-20 DIAGNOSIS — Z6837 Body mass index (BMI) 37.0-37.9, adult: Principal | ICD-10-CM

## 2020-02-20 MED ORDER — ACCU-CHEK GUIDE TEST STRIPS
ORAL_STRIP | Freq: Once | 3 refills | 0.00000 days | Status: SS
Start: 2020-02-20 — End: 2020-02-20

## 2020-02-22 DIAGNOSIS — I110 Hypertensive heart disease with heart failure: Principal | ICD-10-CM

## 2020-02-22 DIAGNOSIS — F319 Bipolar disorder, unspecified: Principal | ICD-10-CM

## 2020-02-22 DIAGNOSIS — N3946 Mixed incontinence: Principal | ICD-10-CM

## 2020-02-22 DIAGNOSIS — G4733 Obstructive sleep apnea (adult) (pediatric): Principal | ICD-10-CM

## 2020-02-22 DIAGNOSIS — I5032 Chronic diastolic (congestive) heart failure: Principal | ICD-10-CM

## 2020-02-22 DIAGNOSIS — E559 Vitamin D deficiency, unspecified: Principal | ICD-10-CM

## 2020-02-22 DIAGNOSIS — L97528 Non-pressure chronic ulcer of other part of left foot with other specified severity: Principal | ICD-10-CM

## 2020-02-22 DIAGNOSIS — M86672 Other chronic osteomyelitis, left ankle and foot: Principal | ICD-10-CM

## 2020-02-22 DIAGNOSIS — Z7952 Long term (current) use of systemic steroids: Principal | ICD-10-CM

## 2020-02-22 DIAGNOSIS — E11621 Type 2 diabetes mellitus with foot ulcer: Principal | ICD-10-CM

## 2020-02-22 DIAGNOSIS — L97521 Non-pressure chronic ulcer of other part of left foot limited to breakdown of skin: Principal | ICD-10-CM

## 2020-02-22 DIAGNOSIS — G894 Chronic pain syndrome: Principal | ICD-10-CM

## 2020-02-22 DIAGNOSIS — Z7951 Long term (current) use of inhaled steroids: Principal | ICD-10-CM

## 2020-02-22 DIAGNOSIS — E1142 Type 2 diabetes mellitus with diabetic polyneuropathy: Principal | ICD-10-CM

## 2020-02-22 DIAGNOSIS — L97423 Non-pressure chronic ulcer of left heel and midfoot with necrosis of muscle: Principal | ICD-10-CM

## 2020-02-22 DIAGNOSIS — K219 Gastro-esophageal reflux disease without esophagitis: Principal | ICD-10-CM

## 2020-02-22 DIAGNOSIS — F419 Anxiety disorder, unspecified: Principal | ICD-10-CM

## 2020-02-22 DIAGNOSIS — Z7984 Long term (current) use of oral hypoglycemic drugs: Principal | ICD-10-CM

## 2020-02-22 DIAGNOSIS — J449 Chronic obstructive pulmonary disease, unspecified: Principal | ICD-10-CM

## 2020-02-22 DIAGNOSIS — Z794 Long term (current) use of insulin: Principal | ICD-10-CM

## 2020-02-22 DIAGNOSIS — E039 Hypothyroidism, unspecified: Principal | ICD-10-CM

## 2020-02-22 DIAGNOSIS — M0579 Rheumatoid arthritis with rheumatoid factor of multiple sites without organ or systems involvement: Principal | ICD-10-CM

## 2020-02-22 DIAGNOSIS — E1169 Type 2 diabetes mellitus with other specified complication: Principal | ICD-10-CM

## 2020-02-22 DIAGNOSIS — Z6837 Body mass index (BMI) 37.0-37.9, adult: Principal | ICD-10-CM

## 2020-02-23 MED ORDER — CHOLECALCIFEROL (VITAMIN D3) 125 MCG (5,000 UNIT) CAPSULE
ORAL_CAPSULE | 11 refills | 0 days
Start: 2020-02-23 — End: ?

## 2020-02-25 ENCOUNTER — Ambulatory Visit: Admit: 2020-02-25 | Discharge: 2020-02-26 | Payer: MEDICAID | Attending: Retina Specialist | Primary: Retina Specialist

## 2020-02-25 DIAGNOSIS — G894 Chronic pain syndrome: Principal | ICD-10-CM

## 2020-02-25 DIAGNOSIS — L97423 Non-pressure chronic ulcer of left heel and midfoot with necrosis of muscle: Principal | ICD-10-CM

## 2020-02-25 DIAGNOSIS — F419 Anxiety disorder, unspecified: Principal | ICD-10-CM

## 2020-02-25 DIAGNOSIS — E11621 Type 2 diabetes mellitus with foot ulcer: Principal | ICD-10-CM

## 2020-02-25 DIAGNOSIS — E139 Other specified diabetes mellitus without complications: Principal | ICD-10-CM

## 2020-02-25 DIAGNOSIS — I5032 Chronic diastolic (congestive) heart failure: Principal | ICD-10-CM

## 2020-02-25 DIAGNOSIS — E559 Vitamin D deficiency, unspecified: Principal | ICD-10-CM

## 2020-02-25 DIAGNOSIS — J449 Chronic obstructive pulmonary disease, unspecified: Principal | ICD-10-CM

## 2020-02-25 DIAGNOSIS — Z6837 Body mass index (BMI) 37.0-37.9, adult: Principal | ICD-10-CM

## 2020-02-25 DIAGNOSIS — E039 Hypothyroidism, unspecified: Principal | ICD-10-CM

## 2020-02-25 DIAGNOSIS — Z7952 Long term (current) use of systemic steroids: Principal | ICD-10-CM

## 2020-02-25 DIAGNOSIS — Z794 Long term (current) use of insulin: Principal | ICD-10-CM

## 2020-02-25 DIAGNOSIS — M86672 Other chronic osteomyelitis, left ankle and foot: Principal | ICD-10-CM

## 2020-02-25 DIAGNOSIS — K219 Gastro-esophageal reflux disease without esophagitis: Principal | ICD-10-CM

## 2020-02-25 DIAGNOSIS — N3946 Mixed incontinence: Principal | ICD-10-CM

## 2020-02-25 DIAGNOSIS — G4733 Obstructive sleep apnea (adult) (pediatric): Principal | ICD-10-CM

## 2020-02-25 DIAGNOSIS — Z7984 Long term (current) use of oral hypoglycemic drugs: Principal | ICD-10-CM

## 2020-02-25 DIAGNOSIS — Z7951 Long term (current) use of inhaled steroids: Principal | ICD-10-CM

## 2020-02-25 DIAGNOSIS — F319 Bipolar disorder, unspecified: Principal | ICD-10-CM

## 2020-02-25 DIAGNOSIS — Z961 Presence of intraocular lens: Principal | ICD-10-CM

## 2020-02-25 DIAGNOSIS — E1142 Type 2 diabetes mellitus with diabetic polyneuropathy: Principal | ICD-10-CM

## 2020-02-25 DIAGNOSIS — E1169 Type 2 diabetes mellitus with other specified complication: Principal | ICD-10-CM

## 2020-02-25 DIAGNOSIS — L97521 Non-pressure chronic ulcer of other part of left foot limited to breakdown of skin: Principal | ICD-10-CM

## 2020-02-25 DIAGNOSIS — M0579 Rheumatoid arthritis with rheumatoid factor of multiple sites without organ or systems involvement: Principal | ICD-10-CM

## 2020-02-25 DIAGNOSIS — I110 Hypertensive heart disease with heart failure: Principal | ICD-10-CM

## 2020-02-25 DIAGNOSIS — L97528 Non-pressure chronic ulcer of other part of left foot with other specified severity: Principal | ICD-10-CM

## 2020-02-25 MED ORDER — CHOLECALCIFEROL (VITAMIN D3) 125 MCG (5,000 UNIT) CAPSULE
ORAL_CAPSULE | Freq: Every day | ORAL | 11 refills | 28.00000 days | Status: CP
Start: 2020-02-25 — End: 2021-02-24

## 2020-02-27 DIAGNOSIS — Z6837 Body mass index (BMI) 37.0-37.9, adult: Principal | ICD-10-CM

## 2020-02-27 DIAGNOSIS — I110 Hypertensive heart disease with heart failure: Principal | ICD-10-CM

## 2020-02-27 DIAGNOSIS — G894 Chronic pain syndrome: Principal | ICD-10-CM

## 2020-02-27 DIAGNOSIS — E1169 Type 2 diabetes mellitus with other specified complication: Principal | ICD-10-CM

## 2020-02-27 DIAGNOSIS — Z794 Long term (current) use of insulin: Principal | ICD-10-CM

## 2020-02-27 DIAGNOSIS — Z7951 Long term (current) use of inhaled steroids: Principal | ICD-10-CM

## 2020-02-27 DIAGNOSIS — I5032 Chronic diastolic (congestive) heart failure: Principal | ICD-10-CM

## 2020-02-27 DIAGNOSIS — Z7984 Long term (current) use of oral hypoglycemic drugs: Principal | ICD-10-CM

## 2020-02-27 DIAGNOSIS — G4733 Obstructive sleep apnea (adult) (pediatric): Principal | ICD-10-CM

## 2020-02-27 DIAGNOSIS — K219 Gastro-esophageal reflux disease without esophagitis: Principal | ICD-10-CM

## 2020-02-27 DIAGNOSIS — E559 Vitamin D deficiency, unspecified: Principal | ICD-10-CM

## 2020-02-27 DIAGNOSIS — E11621 Type 2 diabetes mellitus with foot ulcer: Principal | ICD-10-CM

## 2020-02-27 DIAGNOSIS — F419 Anxiety disorder, unspecified: Principal | ICD-10-CM

## 2020-02-27 DIAGNOSIS — L97528 Non-pressure chronic ulcer of other part of left foot with other specified severity: Principal | ICD-10-CM

## 2020-02-27 DIAGNOSIS — E039 Hypothyroidism, unspecified: Principal | ICD-10-CM

## 2020-02-27 DIAGNOSIS — L97521 Non-pressure chronic ulcer of other part of left foot limited to breakdown of skin: Principal | ICD-10-CM

## 2020-02-27 DIAGNOSIS — M86672 Other chronic osteomyelitis, left ankle and foot: Principal | ICD-10-CM

## 2020-02-27 DIAGNOSIS — L97423 Non-pressure chronic ulcer of left heel and midfoot with necrosis of muscle: Principal | ICD-10-CM

## 2020-02-27 DIAGNOSIS — N3946 Mixed incontinence: Principal | ICD-10-CM

## 2020-02-27 DIAGNOSIS — F319 Bipolar disorder, unspecified: Principal | ICD-10-CM

## 2020-02-27 DIAGNOSIS — J449 Chronic obstructive pulmonary disease, unspecified: Principal | ICD-10-CM

## 2020-02-27 DIAGNOSIS — M0579 Rheumatoid arthritis with rheumatoid factor of multiple sites without organ or systems involvement: Principal | ICD-10-CM

## 2020-02-27 DIAGNOSIS — E1142 Type 2 diabetes mellitus with diabetic polyneuropathy: Principal | ICD-10-CM

## 2020-02-27 DIAGNOSIS — Z7952 Long term (current) use of systemic steroids: Principal | ICD-10-CM

## 2020-02-29 ENCOUNTER — Ambulatory Visit: Admit: 2020-02-29 | Discharge: 2020-03-01 | Payer: MEDICAID

## 2020-02-29 ENCOUNTER — Other Ambulatory Visit: Admit: 2020-02-29 | Discharge: 2020-03-01 | Payer: MEDICAID

## 2020-02-29 ENCOUNTER — Ambulatory Visit
Admit: 2020-02-29 | Discharge: 2020-03-01 | Payer: MEDICAID | Attending: Foot & Ankle Surgery | Primary: Foot & Ankle Surgery

## 2020-02-29 DIAGNOSIS — I5032 Chronic diastolic (congestive) heart failure: Principal | ICD-10-CM

## 2020-02-29 DIAGNOSIS — E11621 Type 2 diabetes mellitus with foot ulcer: Principal | ICD-10-CM

## 2020-02-29 DIAGNOSIS — G894 Chronic pain syndrome: Principal | ICD-10-CM

## 2020-02-29 DIAGNOSIS — L97423 Non-pressure chronic ulcer of left heel and midfoot with necrosis of muscle: Principal | ICD-10-CM

## 2020-02-29 DIAGNOSIS — J449 Chronic obstructive pulmonary disease, unspecified: Principal | ICD-10-CM

## 2020-02-29 DIAGNOSIS — E1169 Type 2 diabetes mellitus with other specified complication: Principal | ICD-10-CM

## 2020-02-29 DIAGNOSIS — Z6837 Body mass index (BMI) 37.0-37.9, adult: Principal | ICD-10-CM

## 2020-02-29 DIAGNOSIS — Z7984 Long term (current) use of oral hypoglycemic drugs: Principal | ICD-10-CM

## 2020-02-29 DIAGNOSIS — E559 Vitamin D deficiency, unspecified: Principal | ICD-10-CM

## 2020-02-29 DIAGNOSIS — E039 Hypothyroidism, unspecified: Principal | ICD-10-CM

## 2020-02-29 DIAGNOSIS — L97522 Non-pressure chronic ulcer of other part of left foot with fat layer exposed: Principal | ICD-10-CM

## 2020-02-29 DIAGNOSIS — E1142 Type 2 diabetes mellitus with diabetic polyneuropathy: Principal | ICD-10-CM

## 2020-02-29 DIAGNOSIS — N3946 Mixed incontinence: Principal | ICD-10-CM

## 2020-02-29 DIAGNOSIS — M0579 Rheumatoid arthritis with rheumatoid factor of multiple sites without organ or systems involvement: Principal | ICD-10-CM

## 2020-02-29 DIAGNOSIS — L97528 Non-pressure chronic ulcer of other part of left foot with other specified severity: Principal | ICD-10-CM

## 2020-02-29 DIAGNOSIS — Z7952 Long term (current) use of systemic steroids: Principal | ICD-10-CM

## 2020-02-29 DIAGNOSIS — Z7951 Long term (current) use of inhaled steroids: Principal | ICD-10-CM

## 2020-02-29 DIAGNOSIS — L03119 Cellulitis of unspecified part of limb: Principal | ICD-10-CM

## 2020-02-29 DIAGNOSIS — F319 Bipolar disorder, unspecified: Principal | ICD-10-CM

## 2020-02-29 DIAGNOSIS — Z9889 Other specified postprocedural states: Principal | ICD-10-CM

## 2020-02-29 DIAGNOSIS — G4733 Obstructive sleep apnea (adult) (pediatric): Principal | ICD-10-CM

## 2020-02-29 DIAGNOSIS — L97523 Non-pressure chronic ulcer of other part of left foot with necrosis of muscle: Principal | ICD-10-CM

## 2020-02-29 DIAGNOSIS — M86672 Other chronic osteomyelitis, left ankle and foot: Principal | ICD-10-CM

## 2020-02-29 DIAGNOSIS — F419 Anxiety disorder, unspecified: Principal | ICD-10-CM

## 2020-02-29 DIAGNOSIS — I110 Hypertensive heart disease with heart failure: Principal | ICD-10-CM

## 2020-02-29 DIAGNOSIS — L97521 Non-pressure chronic ulcer of other part of left foot limited to breakdown of skin: Principal | ICD-10-CM

## 2020-02-29 DIAGNOSIS — Z794 Long term (current) use of insulin: Principal | ICD-10-CM

## 2020-02-29 DIAGNOSIS — K219 Gastro-esophageal reflux disease without esophagitis: Principal | ICD-10-CM

## 2020-02-29 DIAGNOSIS — Z189 Retained foreign body fragments, unspecified material: Principal | ICD-10-CM

## 2020-02-29 MED ORDER — LINEZOLID 600 MG TABLET
ORAL_TABLET | Freq: Two times a day (BID) | ORAL | 0 refills | 5.00000 days | Status: CP
Start: 2020-02-29 — End: 2020-03-05

## 2020-03-02 ENCOUNTER — Encounter: Admit: 2020-03-02 | Discharge: 2020-03-31 | Payer: MEDICAID

## 2020-03-03 ENCOUNTER — Ambulatory Visit
Admit: 2020-03-03 | Discharge: 2020-03-11 | Disposition: A | Discharge status: Home Health | Payer: MEDICAID | Admitting: Vascular Surgery

## 2020-03-03 ENCOUNTER — Encounter
Admit: 2020-03-03 | Discharge: 2020-03-11 | Disposition: A | Discharge status: Home Health | Payer: MEDICAID | Attending: Student in an Organized Health Care Education/Training Program | Admitting: Vascular Surgery

## 2020-03-03 ENCOUNTER — Institutional Professional Consult (permissible substitution): Admit: 2020-03-03 | Discharge: 2020-03-04 | Attending: Nutritionist | Primary: Nutritionist

## 2020-03-03 DIAGNOSIS — Z794 Long term (current) use of insulin: Principal | ICD-10-CM

## 2020-03-03 DIAGNOSIS — N3946 Mixed incontinence: Principal | ICD-10-CM

## 2020-03-03 DIAGNOSIS — M0579 Rheumatoid arthritis with rheumatoid factor of multiple sites without organ or systems involvement: Principal | ICD-10-CM

## 2020-03-03 DIAGNOSIS — E559 Vitamin D deficiency, unspecified: Principal | ICD-10-CM

## 2020-03-03 DIAGNOSIS — L97423 Non-pressure chronic ulcer of left heel and midfoot with necrosis of muscle: Principal | ICD-10-CM

## 2020-03-03 DIAGNOSIS — E1142 Type 2 diabetes mellitus with diabetic polyneuropathy: Principal | ICD-10-CM

## 2020-03-03 DIAGNOSIS — E669 Obesity, unspecified: Principal | ICD-10-CM

## 2020-03-03 DIAGNOSIS — G894 Chronic pain syndrome: Principal | ICD-10-CM

## 2020-03-03 DIAGNOSIS — M86672 Other chronic osteomyelitis, left ankle and foot: Principal | ICD-10-CM

## 2020-03-03 DIAGNOSIS — E1169 Type 2 diabetes mellitus with other specified complication: Principal | ICD-10-CM

## 2020-03-03 DIAGNOSIS — Z7952 Long term (current) use of systemic steroids: Principal | ICD-10-CM

## 2020-03-03 DIAGNOSIS — L97528 Non-pressure chronic ulcer of other part of left foot with other specified severity: Principal | ICD-10-CM

## 2020-03-03 DIAGNOSIS — M069 Rheumatoid arthritis, unspecified: Principal | ICD-10-CM

## 2020-03-03 DIAGNOSIS — Z6837 Body mass index (BMI) 37.0-37.9, adult: Principal | ICD-10-CM

## 2020-03-03 DIAGNOSIS — R7881 Bacteremia: Principal | ICD-10-CM

## 2020-03-03 DIAGNOSIS — Z87891 Personal history of nicotine dependence: Principal | ICD-10-CM

## 2020-03-03 DIAGNOSIS — Z7984 Long term (current) use of oral hypoglycemic drugs: Principal | ICD-10-CM

## 2020-03-03 DIAGNOSIS — Z7951 Long term (current) use of inhaled steroids: Principal | ICD-10-CM

## 2020-03-03 DIAGNOSIS — E039 Hypothyroidism, unspecified: Principal | ICD-10-CM

## 2020-03-03 DIAGNOSIS — Z9884 Bariatric surgery status: Principal | ICD-10-CM

## 2020-03-03 DIAGNOSIS — G4733 Obstructive sleep apnea (adult) (pediatric): Principal | ICD-10-CM

## 2020-03-03 DIAGNOSIS — L089 Local infection of the skin and subcutaneous tissue, unspecified: Principal | ICD-10-CM

## 2020-03-03 DIAGNOSIS — J449 Chronic obstructive pulmonary disease, unspecified: Principal | ICD-10-CM

## 2020-03-03 DIAGNOSIS — M1990 Unspecified osteoarthritis, unspecified site: Principal | ICD-10-CM

## 2020-03-03 DIAGNOSIS — I110 Hypertensive heart disease with heart failure: Principal | ICD-10-CM

## 2020-03-03 DIAGNOSIS — F419 Anxiety disorder, unspecified: Principal | ICD-10-CM

## 2020-03-03 DIAGNOSIS — I5032 Chronic diastolic (congestive) heart failure: Principal | ICD-10-CM

## 2020-03-03 DIAGNOSIS — Z90722 Acquired absence of ovaries, bilateral: Principal | ICD-10-CM

## 2020-03-03 DIAGNOSIS — L97521 Non-pressure chronic ulcer of other part of left foot limited to breakdown of skin: Principal | ICD-10-CM

## 2020-03-03 DIAGNOSIS — Z9049 Acquired absence of other specified parts of digestive tract: Principal | ICD-10-CM

## 2020-03-03 DIAGNOSIS — Z20822 Contact with and (suspected) exposure to covid-19: Principal | ICD-10-CM

## 2020-03-03 DIAGNOSIS — F319 Bipolar disorder, unspecified: Principal | ICD-10-CM

## 2020-03-03 DIAGNOSIS — Z6841 Body Mass Index (BMI) 40.0 and over, adult: Principal | ICD-10-CM

## 2020-03-03 DIAGNOSIS — E11621 Type 2 diabetes mellitus with foot ulcer: Principal | ICD-10-CM

## 2020-03-03 DIAGNOSIS — Z882 Allergy status to sulfonamides status: Principal | ICD-10-CM

## 2020-03-03 DIAGNOSIS — E785 Hyperlipidemia, unspecified: Principal | ICD-10-CM

## 2020-03-03 DIAGNOSIS — L97526 Non-pressure chronic ulcer of other part of left foot with bone involvement without evidence of necrosis: Principal | ICD-10-CM

## 2020-03-03 DIAGNOSIS — K219 Gastro-esophageal reflux disease without esophagitis: Principal | ICD-10-CM

## 2020-03-03 DIAGNOSIS — Z89429 Acquired absence of other toe(s), unspecified side: Principal | ICD-10-CM

## 2020-03-03 DIAGNOSIS — L97529 Non-pressure chronic ulcer of other part of left foot with unspecified severity: Principal | ICD-10-CM

## 2020-03-03 DIAGNOSIS — M86172 Other acute osteomyelitis, left ankle and foot: Principal | ICD-10-CM

## 2020-03-03 DIAGNOSIS — B964 Proteus (mirabilis) (morganii) as the cause of diseases classified elsewhere: Principal | ICD-10-CM

## 2020-03-03 MED ORDER — DEXCOM G6 SENSOR DEVICE
11 refills | 0 days | Status: SS
Start: 2020-03-03 — End: ?

## 2020-03-03 MED ORDER — DEXCOM G6 TRANSMITTER DEVICE
3 refills | 0 days | Status: SS
Start: 2020-03-03 — End: ?

## 2020-03-04 DIAGNOSIS — M86172 Other acute osteomyelitis, left ankle and foot: Principal | ICD-10-CM

## 2020-03-04 DIAGNOSIS — G894 Chronic pain syndrome: Principal | ICD-10-CM

## 2020-03-04 DIAGNOSIS — M069 Rheumatoid arthritis, unspecified: Principal | ICD-10-CM

## 2020-03-04 DIAGNOSIS — G4733 Obstructive sleep apnea (adult) (pediatric): Principal | ICD-10-CM

## 2020-03-04 DIAGNOSIS — K219 Gastro-esophageal reflux disease without esophagitis: Principal | ICD-10-CM

## 2020-03-04 DIAGNOSIS — Z882 Allergy status to sulfonamides status: Principal | ICD-10-CM

## 2020-03-04 DIAGNOSIS — Z87891 Personal history of nicotine dependence: Principal | ICD-10-CM

## 2020-03-04 DIAGNOSIS — Z7952 Long term (current) use of systemic steroids: Principal | ICD-10-CM

## 2020-03-04 DIAGNOSIS — Z89429 Acquired absence of other toe(s), unspecified side: Principal | ICD-10-CM

## 2020-03-04 DIAGNOSIS — E785 Hyperlipidemia, unspecified: Principal | ICD-10-CM

## 2020-03-04 DIAGNOSIS — Z20822 Contact with and (suspected) exposure to covid-19: Principal | ICD-10-CM

## 2020-03-04 DIAGNOSIS — E1169 Type 2 diabetes mellitus with other specified complication: Principal | ICD-10-CM

## 2020-03-04 DIAGNOSIS — Z9884 Bariatric surgery status: Principal | ICD-10-CM

## 2020-03-04 DIAGNOSIS — R7881 Bacteremia: Principal | ICD-10-CM

## 2020-03-04 DIAGNOSIS — L97529 Non-pressure chronic ulcer of other part of left foot with unspecified severity: Principal | ICD-10-CM

## 2020-03-04 DIAGNOSIS — M1990 Unspecified osteoarthritis, unspecified site: Principal | ICD-10-CM

## 2020-03-04 DIAGNOSIS — E11621 Type 2 diabetes mellitus with foot ulcer: Principal | ICD-10-CM

## 2020-03-04 DIAGNOSIS — Z90722 Acquired absence of ovaries, bilateral: Principal | ICD-10-CM

## 2020-03-04 DIAGNOSIS — N3946 Mixed incontinence: Principal | ICD-10-CM

## 2020-03-04 DIAGNOSIS — Z9049 Acquired absence of other specified parts of digestive tract: Principal | ICD-10-CM

## 2020-03-04 DIAGNOSIS — I5032 Chronic diastolic (congestive) heart failure: Principal | ICD-10-CM

## 2020-03-04 DIAGNOSIS — Z6841 Body Mass Index (BMI) 40.0 and over, adult: Principal | ICD-10-CM

## 2020-03-04 DIAGNOSIS — Z794 Long term (current) use of insulin: Principal | ICD-10-CM

## 2020-03-04 DIAGNOSIS — L089 Local infection of the skin and subcutaneous tissue, unspecified: Principal | ICD-10-CM

## 2020-03-04 DIAGNOSIS — F419 Anxiety disorder, unspecified: Principal | ICD-10-CM

## 2020-03-04 DIAGNOSIS — E039 Hypothyroidism, unspecified: Principal | ICD-10-CM

## 2020-03-04 DIAGNOSIS — E669 Obesity, unspecified: Principal | ICD-10-CM

## 2020-03-04 DIAGNOSIS — B964 Proteus (mirabilis) (morganii) as the cause of diseases classified elsewhere: Principal | ICD-10-CM

## 2020-03-05 DIAGNOSIS — B964 Proteus (mirabilis) (morganii) as the cause of diseases classified elsewhere: Principal | ICD-10-CM

## 2020-03-05 DIAGNOSIS — G894 Chronic pain syndrome: Principal | ICD-10-CM

## 2020-03-05 DIAGNOSIS — M069 Rheumatoid arthritis, unspecified: Principal | ICD-10-CM

## 2020-03-05 DIAGNOSIS — E039 Hypothyroidism, unspecified: Principal | ICD-10-CM

## 2020-03-05 DIAGNOSIS — I5032 Chronic diastolic (congestive) heart failure: Principal | ICD-10-CM

## 2020-03-05 DIAGNOSIS — E785 Hyperlipidemia, unspecified: Principal | ICD-10-CM

## 2020-03-05 DIAGNOSIS — L97529 Non-pressure chronic ulcer of other part of left foot with unspecified severity: Principal | ICD-10-CM

## 2020-03-05 DIAGNOSIS — L089 Local infection of the skin and subcutaneous tissue, unspecified: Principal | ICD-10-CM

## 2020-03-05 DIAGNOSIS — Z6841 Body Mass Index (BMI) 40.0 and over, adult: Principal | ICD-10-CM

## 2020-03-05 DIAGNOSIS — E669 Obesity, unspecified: Principal | ICD-10-CM

## 2020-03-05 DIAGNOSIS — Z90722 Acquired absence of ovaries, bilateral: Principal | ICD-10-CM

## 2020-03-05 DIAGNOSIS — K219 Gastro-esophageal reflux disease without esophagitis: Principal | ICD-10-CM

## 2020-03-05 DIAGNOSIS — Z7952 Long term (current) use of systemic steroids: Principal | ICD-10-CM

## 2020-03-05 DIAGNOSIS — G4733 Obstructive sleep apnea (adult) (pediatric): Principal | ICD-10-CM

## 2020-03-05 DIAGNOSIS — N3946 Mixed incontinence: Principal | ICD-10-CM

## 2020-03-05 DIAGNOSIS — Z882 Allergy status to sulfonamides status: Principal | ICD-10-CM

## 2020-03-05 DIAGNOSIS — M86172 Other acute osteomyelitis, left ankle and foot: Principal | ICD-10-CM

## 2020-03-05 DIAGNOSIS — R7881 Bacteremia: Principal | ICD-10-CM

## 2020-03-05 DIAGNOSIS — Z9049 Acquired absence of other specified parts of digestive tract: Principal | ICD-10-CM

## 2020-03-05 DIAGNOSIS — Z20822 Contact with and (suspected) exposure to covid-19: Principal | ICD-10-CM

## 2020-03-05 DIAGNOSIS — E11621 Type 2 diabetes mellitus with foot ulcer: Principal | ICD-10-CM

## 2020-03-05 DIAGNOSIS — Z794 Long term (current) use of insulin: Principal | ICD-10-CM

## 2020-03-05 DIAGNOSIS — F419 Anxiety disorder, unspecified: Principal | ICD-10-CM

## 2020-03-05 DIAGNOSIS — M1990 Unspecified osteoarthritis, unspecified site: Principal | ICD-10-CM

## 2020-03-05 DIAGNOSIS — Z89429 Acquired absence of other toe(s), unspecified side: Principal | ICD-10-CM

## 2020-03-05 DIAGNOSIS — E1169 Type 2 diabetes mellitus with other specified complication: Principal | ICD-10-CM

## 2020-03-05 DIAGNOSIS — Z87891 Personal history of nicotine dependence: Principal | ICD-10-CM

## 2020-03-05 DIAGNOSIS — Z9884 Bariatric surgery status: Principal | ICD-10-CM

## 2020-03-06 MED ORDER — CA 600 MG-D3 400 UNIT-MAG OX 40 MG-ZN-COPPER-MN-BORON CHEWABLE TABLET
0.00000 days
Start: 2020-03-06 — End: 2020-03-25

## 2020-03-07 DIAGNOSIS — E1169 Type 2 diabetes mellitus with other specified complication: Principal | ICD-10-CM

## 2020-03-07 DIAGNOSIS — E785 Hyperlipidemia, unspecified: Principal | ICD-10-CM

## 2020-03-07 DIAGNOSIS — N3946 Mixed incontinence: Principal | ICD-10-CM

## 2020-03-07 DIAGNOSIS — Z794 Long term (current) use of insulin: Principal | ICD-10-CM

## 2020-03-07 DIAGNOSIS — E669 Obesity, unspecified: Principal | ICD-10-CM

## 2020-03-07 DIAGNOSIS — Z7952 Long term (current) use of systemic steroids: Principal | ICD-10-CM

## 2020-03-07 DIAGNOSIS — K219 Gastro-esophageal reflux disease without esophagitis: Principal | ICD-10-CM

## 2020-03-07 DIAGNOSIS — Z20822 Contact with and (suspected) exposure to covid-19: Principal | ICD-10-CM

## 2020-03-07 DIAGNOSIS — L97529 Non-pressure chronic ulcer of other part of left foot with unspecified severity: Principal | ICD-10-CM

## 2020-03-07 DIAGNOSIS — Z882 Allergy status to sulfonamides status: Principal | ICD-10-CM

## 2020-03-07 DIAGNOSIS — M069 Rheumatoid arthritis, unspecified: Principal | ICD-10-CM

## 2020-03-07 DIAGNOSIS — G894 Chronic pain syndrome: Principal | ICD-10-CM

## 2020-03-07 DIAGNOSIS — I5032 Chronic diastolic (congestive) heart failure: Principal | ICD-10-CM

## 2020-03-07 DIAGNOSIS — E039 Hypothyroidism, unspecified: Principal | ICD-10-CM

## 2020-03-07 DIAGNOSIS — Z90722 Acquired absence of ovaries, bilateral: Principal | ICD-10-CM

## 2020-03-07 DIAGNOSIS — Z89429 Acquired absence of other toe(s), unspecified side: Principal | ICD-10-CM

## 2020-03-07 DIAGNOSIS — M1990 Unspecified osteoarthritis, unspecified site: Principal | ICD-10-CM

## 2020-03-07 DIAGNOSIS — Z9884 Bariatric surgery status: Principal | ICD-10-CM

## 2020-03-07 DIAGNOSIS — R7881 Bacteremia: Principal | ICD-10-CM

## 2020-03-07 DIAGNOSIS — Z9049 Acquired absence of other specified parts of digestive tract: Principal | ICD-10-CM

## 2020-03-07 DIAGNOSIS — M86172 Other acute osteomyelitis, left ankle and foot: Principal | ICD-10-CM

## 2020-03-07 DIAGNOSIS — B964 Proteus (mirabilis) (morganii) as the cause of diseases classified elsewhere: Principal | ICD-10-CM

## 2020-03-07 DIAGNOSIS — F419 Anxiety disorder, unspecified: Principal | ICD-10-CM

## 2020-03-07 DIAGNOSIS — L089 Local infection of the skin and subcutaneous tissue, unspecified: Principal | ICD-10-CM

## 2020-03-07 DIAGNOSIS — E11621 Type 2 diabetes mellitus with foot ulcer: Principal | ICD-10-CM

## 2020-03-07 DIAGNOSIS — Z6841 Body Mass Index (BMI) 40.0 and over, adult: Principal | ICD-10-CM

## 2020-03-07 DIAGNOSIS — Z87891 Personal history of nicotine dependence: Principal | ICD-10-CM

## 2020-03-07 DIAGNOSIS — G4733 Obstructive sleep apnea (adult) (pediatric): Principal | ICD-10-CM

## 2020-03-10 DIAGNOSIS — M0579 Rheumatoid arthritis with rheumatoid factor of multiple sites without organ or systems involvement: Principal | ICD-10-CM

## 2020-03-10 DIAGNOSIS — M069 Rheumatoid arthritis, unspecified: Principal | ICD-10-CM

## 2020-03-10 MED ORDER — TRESIBA FLEXTOUCH U-200 INSULIN 200 UNIT/ML (3 ML) SUBCUTANEOUS PEN
Freq: Every evening | SUBCUTANEOUS | 3 refills | 266.00000 days | Status: SS
Start: 2020-03-10 — End: 2020-05-14
  Filled 2020-03-11: qty 9, 14d supply, fill #0

## 2020-03-10 MED ORDER — PEN NEEDLE, DIABETIC 31 GAUGE X 5/16" (8 MM)
Freq: Three times a day (TID) | TRANSDERMAL | 0 refills | 0.00000 days | Status: CP
Start: 2020-03-10 — End: ?

## 2020-03-10 MED ORDER — ACETAMINOPHEN 325 MG TABLET
ORAL_TABLET | Freq: Four times a day (QID) | ORAL | 0 refills | 10.00000 days | Status: CP | PRN
Start: 2020-03-10 — End: ?
  Filled 2020-03-11: qty 80, 10d supply, fill #0
  Filled 2020-03-11: qty 15, 3d supply, fill #0

## 2020-03-10 MED ORDER — VANCOMYCIN 1.5 GRAM/500 ML IN 0.9 % SODIUM CHLORIDE INTRAVENOUS SOLN
Freq: Two times a day (BID) | INTRAVENOUS | 0 refills | 35.00000 days | Status: CP
Start: 2020-03-10 — End: 2020-04-14

## 2020-03-10 MED ORDER — OXYCODONE 5 MG TABLET
ORAL_TABLET | ORAL | 0 refills | 3 days | Status: SS | PRN
Start: 2020-03-10 — End: 2020-04-10

## 2020-03-10 MED ORDER — INSULIN LISPRO (U-100) 100 UNIT/ML SUBCUTANEOUS PEN
Freq: Three times a day (TID) | SUBCUTANEOUS | 0 refills | 34 days | Status: CP
Start: 2020-03-10 — End: 2020-04-19
  Filled 2020-03-11: qty 15, 34d supply, fill #0

## 2020-03-10 MED ORDER — METRONIDAZOLE 500 MG TABLET
ORAL_TABLET | Freq: Three times a day (TID) | ORAL | 0 refills | 35.00000 days | Status: CP
Start: 2020-03-10 — End: 2020-04-14
  Filled 2020-03-11: qty 105, 35d supply, fill #0

## 2020-03-10 MED ORDER — AZTREONAM IVPB 2 GRAM CONNECTOR BAG
Freq: Three times a day (TID) | INTRAVENOUS | 0 refills | 0.00000 days | Status: SS
Start: 2020-03-10 — End: 2020-04-30

## 2020-03-11 DIAGNOSIS — Z794 Long term (current) use of insulin: Principal | ICD-10-CM

## 2020-03-11 DIAGNOSIS — F419 Anxiety disorder, unspecified: Principal | ICD-10-CM

## 2020-03-11 DIAGNOSIS — K219 Gastro-esophageal reflux disease without esophagitis: Principal | ICD-10-CM

## 2020-03-11 DIAGNOSIS — G4733 Obstructive sleep apnea (adult) (pediatric): Principal | ICD-10-CM

## 2020-03-11 DIAGNOSIS — L97528 Non-pressure chronic ulcer of other part of left foot with other specified severity: Principal | ICD-10-CM

## 2020-03-11 DIAGNOSIS — E559 Vitamin D deficiency, unspecified: Principal | ICD-10-CM

## 2020-03-11 DIAGNOSIS — N3946 Mixed incontinence: Principal | ICD-10-CM

## 2020-03-11 DIAGNOSIS — Z7984 Long term (current) use of oral hypoglycemic drugs: Principal | ICD-10-CM

## 2020-03-11 DIAGNOSIS — G894 Chronic pain syndrome: Principal | ICD-10-CM

## 2020-03-11 DIAGNOSIS — L97423 Non-pressure chronic ulcer of left heel and midfoot with necrosis of muscle: Principal | ICD-10-CM

## 2020-03-11 DIAGNOSIS — E1142 Type 2 diabetes mellitus with diabetic polyneuropathy: Principal | ICD-10-CM

## 2020-03-11 DIAGNOSIS — E1169 Type 2 diabetes mellitus with other specified complication: Principal | ICD-10-CM

## 2020-03-11 DIAGNOSIS — Z7952 Long term (current) use of systemic steroids: Principal | ICD-10-CM

## 2020-03-11 DIAGNOSIS — L97521 Non-pressure chronic ulcer of other part of left foot limited to breakdown of skin: Principal | ICD-10-CM

## 2020-03-11 DIAGNOSIS — E039 Hypothyroidism, unspecified: Principal | ICD-10-CM

## 2020-03-11 DIAGNOSIS — Z6837 Body mass index (BMI) 37.0-37.9, adult: Principal | ICD-10-CM

## 2020-03-11 DIAGNOSIS — I110 Hypertensive heart disease with heart failure: Principal | ICD-10-CM

## 2020-03-11 DIAGNOSIS — F319 Bipolar disorder, unspecified: Principal | ICD-10-CM

## 2020-03-11 DIAGNOSIS — Z7951 Long term (current) use of inhaled steroids: Principal | ICD-10-CM

## 2020-03-11 DIAGNOSIS — J449 Chronic obstructive pulmonary disease, unspecified: Principal | ICD-10-CM

## 2020-03-11 DIAGNOSIS — I5032 Chronic diastolic (congestive) heart failure: Principal | ICD-10-CM

## 2020-03-11 DIAGNOSIS — E11621 Type 2 diabetes mellitus with foot ulcer: Principal | ICD-10-CM

## 2020-03-11 DIAGNOSIS — M86672 Other chronic osteomyelitis, left ankle and foot: Principal | ICD-10-CM

## 2020-03-11 DIAGNOSIS — M0579 Rheumatoid arthritis with rheumatoid factor of multiple sites without organ or systems involvement: Principal | ICD-10-CM

## 2020-03-11 MED FILL — HUMALOG KWIKPEN (U-100) INSULIN 100 UNIT/ML SUBCUTANEOUS: 34 days supply | Qty: 15 | Fill #0 | Status: AC

## 2020-03-11 MED FILL — TRUEPLUS PEN NEEDLE 31 GAUGE X 5/16" (8 MM): 33 days supply | Qty: 100 | Fill #0 | Status: AC

## 2020-03-11 MED FILL — OXYCODONE 5 MG TABLET: 3 days supply | Qty: 15 | Fill #0 | Status: AC

## 2020-03-11 MED FILL — METRONIDAZOLE 500 MG TABLET: 35 days supply | Qty: 105 | Fill #0 | Status: AC

## 2020-03-11 MED FILL — ACETAMINOPHEN 325 MG TABLET: 10 days supply | Qty: 80 | Fill #0 | Status: AC

## 2020-03-11 MED FILL — TRESIBA FLEXTOUCH U-200 INSULIN 200 UNIT/ML (3 ML) SUBCUTANEOUS PEN: 14 days supply | Qty: 9 | Fill #0 | Status: AC

## 2020-03-11 MED FILL — TRUEPLUS PEN NEEDLE 31 GAUGE X 5/16" (8 MM): TRANSDERMAL | 33 days supply | Qty: 100 | Fill #0

## 2020-03-12 DIAGNOSIS — M0579 Rheumatoid arthritis with rheumatoid factor of multiple sites without organ or systems involvement: Principal | ICD-10-CM

## 2020-03-12 DIAGNOSIS — E1169 Type 2 diabetes mellitus with other specified complication: Principal | ICD-10-CM

## 2020-03-12 DIAGNOSIS — Z7984 Long term (current) use of oral hypoglycemic drugs: Principal | ICD-10-CM

## 2020-03-12 DIAGNOSIS — K219 Gastro-esophageal reflux disease without esophagitis: Principal | ICD-10-CM

## 2020-03-12 DIAGNOSIS — Z7952 Long term (current) use of systemic steroids: Principal | ICD-10-CM

## 2020-03-12 DIAGNOSIS — E11621 Type 2 diabetes mellitus with foot ulcer: Principal | ICD-10-CM

## 2020-03-12 DIAGNOSIS — Z6837 Body mass index (BMI) 37.0-37.9, adult: Principal | ICD-10-CM

## 2020-03-12 DIAGNOSIS — G894 Chronic pain syndrome: Principal | ICD-10-CM

## 2020-03-12 DIAGNOSIS — E1142 Type 2 diabetes mellitus with diabetic polyneuropathy: Principal | ICD-10-CM

## 2020-03-12 DIAGNOSIS — F419 Anxiety disorder, unspecified: Principal | ICD-10-CM

## 2020-03-12 DIAGNOSIS — N3946 Mixed incontinence: Principal | ICD-10-CM

## 2020-03-12 DIAGNOSIS — Z7951 Long term (current) use of inhaled steroids: Principal | ICD-10-CM

## 2020-03-12 DIAGNOSIS — M86672 Other chronic osteomyelitis, left ankle and foot: Principal | ICD-10-CM

## 2020-03-12 DIAGNOSIS — E559 Vitamin D deficiency, unspecified: Principal | ICD-10-CM

## 2020-03-12 DIAGNOSIS — L97423 Non-pressure chronic ulcer of left heel and midfoot with necrosis of muscle: Principal | ICD-10-CM

## 2020-03-12 DIAGNOSIS — I5032 Chronic diastolic (congestive) heart failure: Principal | ICD-10-CM

## 2020-03-12 DIAGNOSIS — F319 Bipolar disorder, unspecified: Principal | ICD-10-CM

## 2020-03-12 DIAGNOSIS — E039 Hypothyroidism, unspecified: Principal | ICD-10-CM

## 2020-03-12 DIAGNOSIS — L97528 Non-pressure chronic ulcer of other part of left foot with other specified severity: Principal | ICD-10-CM

## 2020-03-12 DIAGNOSIS — I110 Hypertensive heart disease with heart failure: Principal | ICD-10-CM

## 2020-03-12 DIAGNOSIS — L97521 Non-pressure chronic ulcer of other part of left foot limited to breakdown of skin: Principal | ICD-10-CM

## 2020-03-12 DIAGNOSIS — J449 Chronic obstructive pulmonary disease, unspecified: Principal | ICD-10-CM

## 2020-03-12 DIAGNOSIS — Z794 Long term (current) use of insulin: Principal | ICD-10-CM

## 2020-03-12 DIAGNOSIS — G4733 Obstructive sleep apnea (adult) (pediatric): Principal | ICD-10-CM

## 2020-03-13 DIAGNOSIS — N3946 Mixed incontinence: Principal | ICD-10-CM

## 2020-03-13 DIAGNOSIS — M86672 Other chronic osteomyelitis, left ankle and foot: Principal | ICD-10-CM

## 2020-03-13 DIAGNOSIS — Z794 Long term (current) use of insulin: Principal | ICD-10-CM

## 2020-03-13 DIAGNOSIS — G894 Chronic pain syndrome: Principal | ICD-10-CM

## 2020-03-13 DIAGNOSIS — F319 Bipolar disorder, unspecified: Principal | ICD-10-CM

## 2020-03-13 DIAGNOSIS — M069 Rheumatoid arthritis, unspecified: Principal | ICD-10-CM

## 2020-03-13 DIAGNOSIS — I5032 Chronic diastolic (congestive) heart failure: Principal | ICD-10-CM

## 2020-03-13 DIAGNOSIS — J449 Chronic obstructive pulmonary disease, unspecified: Principal | ICD-10-CM

## 2020-03-13 DIAGNOSIS — E1142 Type 2 diabetes mellitus with diabetic polyneuropathy: Principal | ICD-10-CM

## 2020-03-13 DIAGNOSIS — G4733 Obstructive sleep apnea (adult) (pediatric): Principal | ICD-10-CM

## 2020-03-13 DIAGNOSIS — Z7952 Long term (current) use of systemic steroids: Principal | ICD-10-CM

## 2020-03-13 DIAGNOSIS — E039 Hypothyroidism, unspecified: Principal | ICD-10-CM

## 2020-03-13 DIAGNOSIS — E559 Vitamin D deficiency, unspecified: Principal | ICD-10-CM

## 2020-03-13 DIAGNOSIS — M0579 Rheumatoid arthritis with rheumatoid factor of multiple sites without organ or systems involvement: Principal | ICD-10-CM

## 2020-03-13 DIAGNOSIS — Z7951 Long term (current) use of inhaled steroids: Principal | ICD-10-CM

## 2020-03-13 DIAGNOSIS — L97423 Non-pressure chronic ulcer of left heel and midfoot with necrosis of muscle: Principal | ICD-10-CM

## 2020-03-13 DIAGNOSIS — E11621 Type 2 diabetes mellitus with foot ulcer: Principal | ICD-10-CM

## 2020-03-13 DIAGNOSIS — L97521 Non-pressure chronic ulcer of other part of left foot limited to breakdown of skin: Principal | ICD-10-CM

## 2020-03-13 DIAGNOSIS — L97528 Non-pressure chronic ulcer of other part of left foot with other specified severity: Principal | ICD-10-CM

## 2020-03-13 DIAGNOSIS — Z6837 Body mass index (BMI) 37.0-37.9, adult: Principal | ICD-10-CM

## 2020-03-13 DIAGNOSIS — E1169 Type 2 diabetes mellitus with other specified complication: Principal | ICD-10-CM

## 2020-03-13 DIAGNOSIS — K219 Gastro-esophageal reflux disease without esophagitis: Principal | ICD-10-CM

## 2020-03-13 DIAGNOSIS — F419 Anxiety disorder, unspecified: Principal | ICD-10-CM

## 2020-03-13 DIAGNOSIS — I110 Hypertensive heart disease with heart failure: Principal | ICD-10-CM

## 2020-03-13 DIAGNOSIS — Z7984 Long term (current) use of oral hypoglycemic drugs: Principal | ICD-10-CM

## 2020-03-14 DIAGNOSIS — Z6837 Body mass index (BMI) 37.0-37.9, adult: Principal | ICD-10-CM

## 2020-03-14 DIAGNOSIS — Z7952 Long term (current) use of systemic steroids: Principal | ICD-10-CM

## 2020-03-14 DIAGNOSIS — M0579 Rheumatoid arthritis with rheumatoid factor of multiple sites without organ or systems involvement: Principal | ICD-10-CM

## 2020-03-14 DIAGNOSIS — Z794 Long term (current) use of insulin: Principal | ICD-10-CM

## 2020-03-14 DIAGNOSIS — Z7984 Long term (current) use of oral hypoglycemic drugs: Principal | ICD-10-CM

## 2020-03-14 DIAGNOSIS — G4733 Obstructive sleep apnea (adult) (pediatric): Principal | ICD-10-CM

## 2020-03-14 DIAGNOSIS — E039 Hypothyroidism, unspecified: Principal | ICD-10-CM

## 2020-03-14 DIAGNOSIS — Z7951 Long term (current) use of inhaled steroids: Principal | ICD-10-CM

## 2020-03-14 DIAGNOSIS — G894 Chronic pain syndrome: Principal | ICD-10-CM

## 2020-03-14 DIAGNOSIS — N3946 Mixed incontinence: Principal | ICD-10-CM

## 2020-03-14 DIAGNOSIS — E559 Vitamin D deficiency, unspecified: Principal | ICD-10-CM

## 2020-03-14 DIAGNOSIS — J449 Chronic obstructive pulmonary disease, unspecified: Principal | ICD-10-CM

## 2020-03-14 DIAGNOSIS — L97521 Non-pressure chronic ulcer of other part of left foot limited to breakdown of skin: Principal | ICD-10-CM

## 2020-03-14 DIAGNOSIS — K219 Gastro-esophageal reflux disease without esophagitis: Principal | ICD-10-CM

## 2020-03-14 DIAGNOSIS — I110 Hypertensive heart disease with heart failure: Principal | ICD-10-CM

## 2020-03-14 DIAGNOSIS — L97423 Non-pressure chronic ulcer of left heel and midfoot with necrosis of muscle: Principal | ICD-10-CM

## 2020-03-14 DIAGNOSIS — E1142 Type 2 diabetes mellitus with diabetic polyneuropathy: Principal | ICD-10-CM

## 2020-03-14 DIAGNOSIS — E1169 Type 2 diabetes mellitus with other specified complication: Principal | ICD-10-CM

## 2020-03-14 DIAGNOSIS — L97528 Non-pressure chronic ulcer of other part of left foot with other specified severity: Principal | ICD-10-CM

## 2020-03-14 DIAGNOSIS — M86672 Other chronic osteomyelitis, left ankle and foot: Principal | ICD-10-CM

## 2020-03-14 DIAGNOSIS — I5032 Chronic diastolic (congestive) heart failure: Principal | ICD-10-CM

## 2020-03-14 DIAGNOSIS — F419 Anxiety disorder, unspecified: Principal | ICD-10-CM

## 2020-03-14 DIAGNOSIS — E11621 Type 2 diabetes mellitus with foot ulcer: Principal | ICD-10-CM

## 2020-03-14 DIAGNOSIS — F319 Bipolar disorder, unspecified: Principal | ICD-10-CM

## 2020-03-15 DIAGNOSIS — N3946 Mixed incontinence: Principal | ICD-10-CM

## 2020-03-15 DIAGNOSIS — K219 Gastro-esophageal reflux disease without esophagitis: Principal | ICD-10-CM

## 2020-03-15 DIAGNOSIS — L97423 Non-pressure chronic ulcer of left heel and midfoot with necrosis of muscle: Principal | ICD-10-CM

## 2020-03-15 DIAGNOSIS — G894 Chronic pain syndrome: Principal | ICD-10-CM

## 2020-03-15 DIAGNOSIS — M0579 Rheumatoid arthritis with rheumatoid factor of multiple sites without organ or systems involvement: Principal | ICD-10-CM

## 2020-03-15 DIAGNOSIS — F319 Bipolar disorder, unspecified: Principal | ICD-10-CM

## 2020-03-15 DIAGNOSIS — E1169 Type 2 diabetes mellitus with other specified complication: Principal | ICD-10-CM

## 2020-03-15 DIAGNOSIS — L97521 Non-pressure chronic ulcer of other part of left foot limited to breakdown of skin: Principal | ICD-10-CM

## 2020-03-15 DIAGNOSIS — Z7952 Long term (current) use of systemic steroids: Principal | ICD-10-CM

## 2020-03-15 DIAGNOSIS — Z7984 Long term (current) use of oral hypoglycemic drugs: Principal | ICD-10-CM

## 2020-03-15 DIAGNOSIS — E559 Vitamin D deficiency, unspecified: Principal | ICD-10-CM

## 2020-03-15 DIAGNOSIS — I5032 Chronic diastolic (congestive) heart failure: Principal | ICD-10-CM

## 2020-03-15 DIAGNOSIS — E1142 Type 2 diabetes mellitus with diabetic polyneuropathy: Principal | ICD-10-CM

## 2020-03-15 DIAGNOSIS — E11621 Type 2 diabetes mellitus with foot ulcer: Principal | ICD-10-CM

## 2020-03-15 DIAGNOSIS — Z794 Long term (current) use of insulin: Principal | ICD-10-CM

## 2020-03-15 DIAGNOSIS — Z7951 Long term (current) use of inhaled steroids: Principal | ICD-10-CM

## 2020-03-15 DIAGNOSIS — F419 Anxiety disorder, unspecified: Principal | ICD-10-CM

## 2020-03-15 DIAGNOSIS — L97528 Non-pressure chronic ulcer of other part of left foot with other specified severity: Principal | ICD-10-CM

## 2020-03-15 DIAGNOSIS — J449 Chronic obstructive pulmonary disease, unspecified: Principal | ICD-10-CM

## 2020-03-15 DIAGNOSIS — G4733 Obstructive sleep apnea (adult) (pediatric): Principal | ICD-10-CM

## 2020-03-15 DIAGNOSIS — I110 Hypertensive heart disease with heart failure: Principal | ICD-10-CM

## 2020-03-15 DIAGNOSIS — Z6837 Body mass index (BMI) 37.0-37.9, adult: Principal | ICD-10-CM

## 2020-03-15 DIAGNOSIS — E039 Hypothyroidism, unspecified: Principal | ICD-10-CM

## 2020-03-15 DIAGNOSIS — M86672 Other chronic osteomyelitis, left ankle and foot: Principal | ICD-10-CM

## 2020-03-17 DIAGNOSIS — Z6837 Body mass index (BMI) 37.0-37.9, adult: Principal | ICD-10-CM

## 2020-03-17 DIAGNOSIS — I110 Hypertensive heart disease with heart failure: Principal | ICD-10-CM

## 2020-03-17 DIAGNOSIS — E039 Hypothyroidism, unspecified: Principal | ICD-10-CM

## 2020-03-17 DIAGNOSIS — E11621 Type 2 diabetes mellitus with foot ulcer: Principal | ICD-10-CM

## 2020-03-17 DIAGNOSIS — L97423 Non-pressure chronic ulcer of left heel and midfoot with necrosis of muscle: Principal | ICD-10-CM

## 2020-03-17 DIAGNOSIS — N3946 Mixed incontinence: Principal | ICD-10-CM

## 2020-03-17 DIAGNOSIS — I5032 Chronic diastolic (congestive) heart failure: Principal | ICD-10-CM

## 2020-03-17 DIAGNOSIS — G894 Chronic pain syndrome: Principal | ICD-10-CM

## 2020-03-17 DIAGNOSIS — J449 Chronic obstructive pulmonary disease, unspecified: Principal | ICD-10-CM

## 2020-03-17 DIAGNOSIS — G4733 Obstructive sleep apnea (adult) (pediatric): Principal | ICD-10-CM

## 2020-03-17 DIAGNOSIS — E1142 Type 2 diabetes mellitus with diabetic polyneuropathy: Principal | ICD-10-CM

## 2020-03-17 DIAGNOSIS — L97528 Non-pressure chronic ulcer of other part of left foot with other specified severity: Principal | ICD-10-CM

## 2020-03-17 DIAGNOSIS — F319 Bipolar disorder, unspecified: Principal | ICD-10-CM

## 2020-03-17 DIAGNOSIS — L97521 Non-pressure chronic ulcer of other part of left foot limited to breakdown of skin: Principal | ICD-10-CM

## 2020-03-17 DIAGNOSIS — Z794 Long term (current) use of insulin: Principal | ICD-10-CM

## 2020-03-17 DIAGNOSIS — K219 Gastro-esophageal reflux disease without esophagitis: Principal | ICD-10-CM

## 2020-03-17 DIAGNOSIS — Z7951 Long term (current) use of inhaled steroids: Principal | ICD-10-CM

## 2020-03-17 DIAGNOSIS — E559 Vitamin D deficiency, unspecified: Principal | ICD-10-CM

## 2020-03-17 DIAGNOSIS — F419 Anxiety disorder, unspecified: Principal | ICD-10-CM

## 2020-03-17 DIAGNOSIS — E1169 Type 2 diabetes mellitus with other specified complication: Principal | ICD-10-CM

## 2020-03-17 DIAGNOSIS — Z7952 Long term (current) use of systemic steroids: Principal | ICD-10-CM

## 2020-03-17 DIAGNOSIS — Z7984 Long term (current) use of oral hypoglycemic drugs: Principal | ICD-10-CM

## 2020-03-17 DIAGNOSIS — M86672 Other chronic osteomyelitis, left ankle and foot: Principal | ICD-10-CM

## 2020-03-17 DIAGNOSIS — M0579 Rheumatoid arthritis with rheumatoid factor of multiple sites without organ or systems involvement: Principal | ICD-10-CM

## 2020-03-18 ENCOUNTER — Other Ambulatory Visit: Admit: 2020-03-18 | Discharge: 2020-03-19 | Payer: MEDICAID

## 2020-03-18 ENCOUNTER — Ambulatory Visit: Admit: 2020-03-18 | Discharge: 2020-03-19 | Payer: MEDICAID

## 2020-03-18 DIAGNOSIS — Z794 Long term (current) use of insulin: Principal | ICD-10-CM

## 2020-03-18 DIAGNOSIS — L97528 Non-pressure chronic ulcer of other part of left foot with other specified severity: Principal | ICD-10-CM

## 2020-03-18 DIAGNOSIS — N3946 Mixed incontinence: Principal | ICD-10-CM

## 2020-03-18 DIAGNOSIS — I5032 Chronic diastolic (congestive) heart failure: Principal | ICD-10-CM

## 2020-03-18 DIAGNOSIS — M0579 Rheumatoid arthritis with rheumatoid factor of multiple sites without organ or systems involvement: Principal | ICD-10-CM

## 2020-03-18 DIAGNOSIS — L97521 Non-pressure chronic ulcer of other part of left foot limited to breakdown of skin: Principal | ICD-10-CM

## 2020-03-18 DIAGNOSIS — J449 Chronic obstructive pulmonary disease, unspecified: Principal | ICD-10-CM

## 2020-03-18 DIAGNOSIS — L97423 Non-pressure chronic ulcer of left heel and midfoot with necrosis of muscle: Principal | ICD-10-CM

## 2020-03-18 DIAGNOSIS — E559 Vitamin D deficiency, unspecified: Principal | ICD-10-CM

## 2020-03-18 DIAGNOSIS — I110 Hypertensive heart disease with heart failure: Principal | ICD-10-CM

## 2020-03-18 DIAGNOSIS — E11621 Type 2 diabetes mellitus with foot ulcer: Principal | ICD-10-CM

## 2020-03-18 DIAGNOSIS — Z6837 Body mass index (BMI) 37.0-37.9, adult: Principal | ICD-10-CM

## 2020-03-18 DIAGNOSIS — M86672 Other chronic osteomyelitis, left ankle and foot: Principal | ICD-10-CM

## 2020-03-18 DIAGNOSIS — F419 Anxiety disorder, unspecified: Principal | ICD-10-CM

## 2020-03-18 DIAGNOSIS — G4733 Obstructive sleep apnea (adult) (pediatric): Principal | ICD-10-CM

## 2020-03-18 DIAGNOSIS — K219 Gastro-esophageal reflux disease without esophagitis: Principal | ICD-10-CM

## 2020-03-18 DIAGNOSIS — E1169 Type 2 diabetes mellitus with other specified complication: Principal | ICD-10-CM

## 2020-03-18 DIAGNOSIS — Z7951 Long term (current) use of inhaled steroids: Principal | ICD-10-CM

## 2020-03-18 DIAGNOSIS — Z7952 Long term (current) use of systemic steroids: Principal | ICD-10-CM

## 2020-03-18 DIAGNOSIS — E1142 Type 2 diabetes mellitus with diabetic polyneuropathy: Principal | ICD-10-CM

## 2020-03-18 DIAGNOSIS — F319 Bipolar disorder, unspecified: Principal | ICD-10-CM

## 2020-03-18 DIAGNOSIS — E039 Hypothyroidism, unspecified: Principal | ICD-10-CM

## 2020-03-18 DIAGNOSIS — E1121 Type 2 diabetes mellitus with diabetic nephropathy: Principal | ICD-10-CM

## 2020-03-18 DIAGNOSIS — Z7984 Long term (current) use of oral hypoglycemic drugs: Principal | ICD-10-CM

## 2020-03-18 DIAGNOSIS — G894 Chronic pain syndrome: Principal | ICD-10-CM

## 2020-03-18 DIAGNOSIS — M069 Rheumatoid arthritis, unspecified: Principal | ICD-10-CM

## 2020-03-18 MED ORDER — MISCELLANEOUS MEDICAL SUPPLY MISC
0 refills | 0 days
Start: 2020-03-18 — End: ?

## 2020-03-19 DIAGNOSIS — N3946 Mixed incontinence: Principal | ICD-10-CM

## 2020-03-19 DIAGNOSIS — L97423 Non-pressure chronic ulcer of left heel and midfoot with necrosis of muscle: Principal | ICD-10-CM

## 2020-03-19 DIAGNOSIS — Z7952 Long term (current) use of systemic steroids: Principal | ICD-10-CM

## 2020-03-19 DIAGNOSIS — Z794 Long term (current) use of insulin: Principal | ICD-10-CM

## 2020-03-19 DIAGNOSIS — M0579 Rheumatoid arthritis with rheumatoid factor of multiple sites without organ or systems involvement: Principal | ICD-10-CM

## 2020-03-19 DIAGNOSIS — L97521 Non-pressure chronic ulcer of other part of left foot limited to breakdown of skin: Principal | ICD-10-CM

## 2020-03-19 DIAGNOSIS — Z6837 Body mass index (BMI) 37.0-37.9, adult: Principal | ICD-10-CM

## 2020-03-19 DIAGNOSIS — I110 Hypertensive heart disease with heart failure: Principal | ICD-10-CM

## 2020-03-19 DIAGNOSIS — F319 Bipolar disorder, unspecified: Principal | ICD-10-CM

## 2020-03-19 DIAGNOSIS — E559 Vitamin D deficiency, unspecified: Principal | ICD-10-CM

## 2020-03-19 DIAGNOSIS — G4733 Obstructive sleep apnea (adult) (pediatric): Principal | ICD-10-CM

## 2020-03-19 DIAGNOSIS — J449 Chronic obstructive pulmonary disease, unspecified: Principal | ICD-10-CM

## 2020-03-19 DIAGNOSIS — I5032 Chronic diastolic (congestive) heart failure: Principal | ICD-10-CM

## 2020-03-19 DIAGNOSIS — L97528 Non-pressure chronic ulcer of other part of left foot with other specified severity: Principal | ICD-10-CM

## 2020-03-19 DIAGNOSIS — Z7984 Long term (current) use of oral hypoglycemic drugs: Principal | ICD-10-CM

## 2020-03-19 DIAGNOSIS — Z7951 Long term (current) use of inhaled steroids: Principal | ICD-10-CM

## 2020-03-19 DIAGNOSIS — M86672 Other chronic osteomyelitis, left ankle and foot: Principal | ICD-10-CM

## 2020-03-19 DIAGNOSIS — E11621 Type 2 diabetes mellitus with foot ulcer: Principal | ICD-10-CM

## 2020-03-19 DIAGNOSIS — G894 Chronic pain syndrome: Principal | ICD-10-CM

## 2020-03-19 DIAGNOSIS — E1169 Type 2 diabetes mellitus with other specified complication: Principal | ICD-10-CM

## 2020-03-19 DIAGNOSIS — E1142 Type 2 diabetes mellitus with diabetic polyneuropathy: Principal | ICD-10-CM

## 2020-03-19 DIAGNOSIS — K219 Gastro-esophageal reflux disease without esophagitis: Principal | ICD-10-CM

## 2020-03-19 DIAGNOSIS — E039 Hypothyroidism, unspecified: Principal | ICD-10-CM

## 2020-03-19 DIAGNOSIS — F419 Anxiety disorder, unspecified: Principal | ICD-10-CM

## 2020-03-19 MED ORDER — MISCELLANEOUS MEDICAL SUPPLY MISC
0 refills | 0 days | Status: SS
Start: 2020-03-19 — End: ?

## 2020-03-20 ENCOUNTER — Ambulatory Visit: Admit: 2020-03-20 | Discharge: 2020-03-21 | Payer: MEDICAID

## 2020-03-20 ENCOUNTER — Other Ambulatory Visit: Admit: 2020-03-20 | Discharge: 2020-03-21 | Payer: MEDICAID

## 2020-03-20 DIAGNOSIS — L97423 Non-pressure chronic ulcer of left heel and midfoot with necrosis of muscle: Principal | ICD-10-CM

## 2020-03-20 DIAGNOSIS — M0579 Rheumatoid arthritis with rheumatoid factor of multiple sites without organ or systems involvement: Principal | ICD-10-CM

## 2020-03-20 DIAGNOSIS — F319 Bipolar disorder, unspecified: Principal | ICD-10-CM

## 2020-03-20 DIAGNOSIS — L97528 Non-pressure chronic ulcer of other part of left foot with other specified severity: Principal | ICD-10-CM

## 2020-03-20 DIAGNOSIS — F419 Anxiety disorder, unspecified: Principal | ICD-10-CM

## 2020-03-20 DIAGNOSIS — M795 Residual foreign body in soft tissue: Principal | ICD-10-CM

## 2020-03-20 DIAGNOSIS — E559 Vitamin D deficiency, unspecified: Principal | ICD-10-CM

## 2020-03-20 DIAGNOSIS — Z7951 Long term (current) use of inhaled steroids: Principal | ICD-10-CM

## 2020-03-20 DIAGNOSIS — E11621 Type 2 diabetes mellitus with foot ulcer: Principal | ICD-10-CM

## 2020-03-20 DIAGNOSIS — Z794 Long term (current) use of insulin: Principal | ICD-10-CM

## 2020-03-20 DIAGNOSIS — Z4801 Encounter for change or removal of surgical wound dressing: Principal | ICD-10-CM

## 2020-03-20 DIAGNOSIS — M86672 Other chronic osteomyelitis, left ankle and foot: Principal | ICD-10-CM

## 2020-03-20 DIAGNOSIS — G4733 Obstructive sleep apnea (adult) (pediatric): Principal | ICD-10-CM

## 2020-03-20 DIAGNOSIS — I5032 Chronic diastolic (congestive) heart failure: Principal | ICD-10-CM

## 2020-03-20 DIAGNOSIS — N3946 Mixed incontinence: Principal | ICD-10-CM

## 2020-03-20 DIAGNOSIS — I110 Hypertensive heart disease with heart failure: Principal | ICD-10-CM

## 2020-03-20 DIAGNOSIS — Z9889 Other specified postprocedural states: Principal | ICD-10-CM

## 2020-03-20 DIAGNOSIS — L97521 Non-pressure chronic ulcer of other part of left foot limited to breakdown of skin: Principal | ICD-10-CM

## 2020-03-20 DIAGNOSIS — Z7984 Long term (current) use of oral hypoglycemic drugs: Principal | ICD-10-CM

## 2020-03-20 DIAGNOSIS — Z6837 Body mass index (BMI) 37.0-37.9, adult: Principal | ICD-10-CM

## 2020-03-20 DIAGNOSIS — E1142 Type 2 diabetes mellitus with diabetic polyneuropathy: Principal | ICD-10-CM

## 2020-03-20 DIAGNOSIS — G894 Chronic pain syndrome: Principal | ICD-10-CM

## 2020-03-20 DIAGNOSIS — Z7952 Long term (current) use of systemic steroids: Principal | ICD-10-CM

## 2020-03-20 DIAGNOSIS — E1169 Type 2 diabetes mellitus with other specified complication: Principal | ICD-10-CM

## 2020-03-20 DIAGNOSIS — J449 Chronic obstructive pulmonary disease, unspecified: Principal | ICD-10-CM

## 2020-03-20 DIAGNOSIS — E039 Hypothyroidism, unspecified: Principal | ICD-10-CM

## 2020-03-20 DIAGNOSIS — K219 Gastro-esophageal reflux disease without esophagitis: Principal | ICD-10-CM

## 2020-03-21 DIAGNOSIS — M0579 Rheumatoid arthritis with rheumatoid factor of multiple sites without organ or systems involvement: Principal | ICD-10-CM

## 2020-03-21 DIAGNOSIS — K219 Gastro-esophageal reflux disease without esophagitis: Principal | ICD-10-CM

## 2020-03-21 DIAGNOSIS — I110 Hypertensive heart disease with heart failure: Principal | ICD-10-CM

## 2020-03-21 DIAGNOSIS — Z7951 Long term (current) use of inhaled steroids: Principal | ICD-10-CM

## 2020-03-21 DIAGNOSIS — E559 Vitamin D deficiency, unspecified: Principal | ICD-10-CM

## 2020-03-21 DIAGNOSIS — J449 Chronic obstructive pulmonary disease, unspecified: Principal | ICD-10-CM

## 2020-03-21 DIAGNOSIS — E039 Hypothyroidism, unspecified: Principal | ICD-10-CM

## 2020-03-21 DIAGNOSIS — E1169 Type 2 diabetes mellitus with other specified complication: Principal | ICD-10-CM

## 2020-03-21 DIAGNOSIS — G894 Chronic pain syndrome: Principal | ICD-10-CM

## 2020-03-21 DIAGNOSIS — E1142 Type 2 diabetes mellitus with diabetic polyneuropathy: Principal | ICD-10-CM

## 2020-03-21 DIAGNOSIS — Z794 Long term (current) use of insulin: Principal | ICD-10-CM

## 2020-03-21 DIAGNOSIS — G4733 Obstructive sleep apnea (adult) (pediatric): Principal | ICD-10-CM

## 2020-03-21 DIAGNOSIS — Z7952 Long term (current) use of systemic steroids: Principal | ICD-10-CM

## 2020-03-21 DIAGNOSIS — F419 Anxiety disorder, unspecified: Principal | ICD-10-CM

## 2020-03-21 DIAGNOSIS — Z7984 Long term (current) use of oral hypoglycemic drugs: Principal | ICD-10-CM

## 2020-03-21 DIAGNOSIS — L97423 Non-pressure chronic ulcer of left heel and midfoot with necrosis of muscle: Principal | ICD-10-CM

## 2020-03-21 DIAGNOSIS — L97521 Non-pressure chronic ulcer of other part of left foot limited to breakdown of skin: Principal | ICD-10-CM

## 2020-03-21 DIAGNOSIS — E11621 Type 2 diabetes mellitus with foot ulcer: Principal | ICD-10-CM

## 2020-03-21 DIAGNOSIS — F319 Bipolar disorder, unspecified: Principal | ICD-10-CM

## 2020-03-21 DIAGNOSIS — I5032 Chronic diastolic (congestive) heart failure: Principal | ICD-10-CM

## 2020-03-21 DIAGNOSIS — N3946 Mixed incontinence: Principal | ICD-10-CM

## 2020-03-21 DIAGNOSIS — L97528 Non-pressure chronic ulcer of other part of left foot with other specified severity: Principal | ICD-10-CM

## 2020-03-21 DIAGNOSIS — Z6837 Body mass index (BMI) 37.0-37.9, adult: Principal | ICD-10-CM

## 2020-03-21 DIAGNOSIS — M86672 Other chronic osteomyelitis, left ankle and foot: Principal | ICD-10-CM

## 2020-03-21 MED ORDER — MISCELLANEOUS MEDICAL SUPPLY MISC
0 refills | 0.00000 days | Status: CP
Start: 2020-03-21 — End: ?

## 2020-03-24 ENCOUNTER — Telehealth: Payer: Self-pay

## 2020-03-24 ENCOUNTER — Other Ambulatory Visit: Admit: 2020-03-24 | Discharge: 2020-03-25 | Payer: MEDICAID

## 2020-03-24 DIAGNOSIS — L97521 Non-pressure chronic ulcer of other part of left foot limited to breakdown of skin: Principal | ICD-10-CM

## 2020-03-24 DIAGNOSIS — N3946 Mixed incontinence: Principal | ICD-10-CM

## 2020-03-24 DIAGNOSIS — F319 Bipolar disorder, unspecified: Principal | ICD-10-CM

## 2020-03-24 DIAGNOSIS — M86672 Other chronic osteomyelitis, left ankle and foot: Principal | ICD-10-CM

## 2020-03-24 DIAGNOSIS — L97423 Non-pressure chronic ulcer of left heel and midfoot with necrosis of muscle: Principal | ICD-10-CM

## 2020-03-24 DIAGNOSIS — K219 Gastro-esophageal reflux disease without esophagitis: Principal | ICD-10-CM

## 2020-03-24 DIAGNOSIS — Z4801 Encounter for change or removal of surgical wound dressing: Principal | ICD-10-CM

## 2020-03-24 DIAGNOSIS — E039 Hypothyroidism, unspecified: Principal | ICD-10-CM

## 2020-03-24 DIAGNOSIS — G894 Chronic pain syndrome: Principal | ICD-10-CM

## 2020-03-24 DIAGNOSIS — L97528 Non-pressure chronic ulcer of other part of left foot with other specified severity: Principal | ICD-10-CM

## 2020-03-24 DIAGNOSIS — Z6837 Body mass index (BMI) 37.0-37.9, adult: Principal | ICD-10-CM

## 2020-03-24 DIAGNOSIS — I5032 Chronic diastolic (congestive) heart failure: Principal | ICD-10-CM

## 2020-03-24 DIAGNOSIS — E1169 Type 2 diabetes mellitus with other specified complication: Principal | ICD-10-CM

## 2020-03-24 DIAGNOSIS — I110 Hypertensive heart disease with heart failure: Principal | ICD-10-CM

## 2020-03-24 DIAGNOSIS — G4733 Obstructive sleep apnea (adult) (pediatric): Principal | ICD-10-CM

## 2020-03-24 DIAGNOSIS — E11621 Type 2 diabetes mellitus with foot ulcer: Principal | ICD-10-CM

## 2020-03-24 DIAGNOSIS — E559 Vitamin D deficiency, unspecified: Principal | ICD-10-CM

## 2020-03-24 DIAGNOSIS — Z7952 Long term (current) use of systemic steroids: Principal | ICD-10-CM

## 2020-03-24 DIAGNOSIS — M0579 Rheumatoid arthritis with rheumatoid factor of multiple sites without organ or systems involvement: Principal | ICD-10-CM

## 2020-03-24 DIAGNOSIS — E1142 Type 2 diabetes mellitus with diabetic polyneuropathy: Principal | ICD-10-CM

## 2020-03-24 DIAGNOSIS — J449 Chronic obstructive pulmonary disease, unspecified: Principal | ICD-10-CM

## 2020-03-24 DIAGNOSIS — Z7951 Long term (current) use of inhaled steroids: Principal | ICD-10-CM

## 2020-03-24 DIAGNOSIS — F419 Anxiety disorder, unspecified: Principal | ICD-10-CM

## 2020-03-24 DIAGNOSIS — Z794 Long term (current) use of insulin: Principal | ICD-10-CM

## 2020-03-24 DIAGNOSIS — Z7984 Long term (current) use of oral hypoglycemic drugs: Principal | ICD-10-CM

## 2020-03-24 NOTE — Telephone Encounter (Signed)
She called to say her oxycodone needs prior auth

## 2020-03-25 ENCOUNTER — Ambulatory Visit: Admit: 2020-03-25 | Discharge: 2020-03-26 | Payer: MEDICAID

## 2020-03-25 DIAGNOSIS — M869 Osteomyelitis, unspecified: Principal | ICD-10-CM

## 2020-03-25 DIAGNOSIS — L97423 Non-pressure chronic ulcer of left heel and midfoot with necrosis of muscle: Principal | ICD-10-CM

## 2020-03-25 DIAGNOSIS — E11621 Type 2 diabetes mellitus with foot ulcer: Principal | ICD-10-CM

## 2020-03-25 DIAGNOSIS — E1169 Type 2 diabetes mellitus with other specified complication: Principal | ICD-10-CM

## 2020-03-25 DIAGNOSIS — Z89422 Acquired absence of other left toe(s): Principal | ICD-10-CM

## 2020-03-25 DIAGNOSIS — E1142 Type 2 diabetes mellitus with diabetic polyneuropathy: Principal | ICD-10-CM

## 2020-03-25 DIAGNOSIS — L97521 Non-pressure chronic ulcer of other part of left foot limited to breakdown of skin: Principal | ICD-10-CM

## 2020-03-25 MED ORDER — CA 600 MG-D3 400 UNIT-MAG OX 40 MG-ZN-COPPER-MN-BORON CHEWABLE TABLET
ORAL_TABLET | ORAL | 11 refills | 0.00000 days | Status: CP
Start: 2020-03-25 — End: ?

## 2020-03-26 DIAGNOSIS — L97423 Non-pressure chronic ulcer of left heel and midfoot with necrosis of muscle: Principal | ICD-10-CM

## 2020-03-26 DIAGNOSIS — G4733 Obstructive sleep apnea (adult) (pediatric): Principal | ICD-10-CM

## 2020-03-26 DIAGNOSIS — L97521 Non-pressure chronic ulcer of other part of left foot limited to breakdown of skin: Principal | ICD-10-CM

## 2020-03-26 DIAGNOSIS — F319 Bipolar disorder, unspecified: Principal | ICD-10-CM

## 2020-03-26 DIAGNOSIS — E039 Hypothyroidism, unspecified: Principal | ICD-10-CM

## 2020-03-26 DIAGNOSIS — F419 Anxiety disorder, unspecified: Principal | ICD-10-CM

## 2020-03-26 DIAGNOSIS — Z794 Long term (current) use of insulin: Principal | ICD-10-CM

## 2020-03-26 DIAGNOSIS — Z7951 Long term (current) use of inhaled steroids: Principal | ICD-10-CM

## 2020-03-26 DIAGNOSIS — Z7984 Long term (current) use of oral hypoglycemic drugs: Principal | ICD-10-CM

## 2020-03-26 DIAGNOSIS — E1169 Type 2 diabetes mellitus with other specified complication: Principal | ICD-10-CM

## 2020-03-26 DIAGNOSIS — M86672 Other chronic osteomyelitis, left ankle and foot: Principal | ICD-10-CM

## 2020-03-26 DIAGNOSIS — I110 Hypertensive heart disease with heart failure: Principal | ICD-10-CM

## 2020-03-26 DIAGNOSIS — J449 Chronic obstructive pulmonary disease, unspecified: Principal | ICD-10-CM

## 2020-03-26 DIAGNOSIS — E11621 Type 2 diabetes mellitus with foot ulcer: Principal | ICD-10-CM

## 2020-03-26 DIAGNOSIS — Z7952 Long term (current) use of systemic steroids: Principal | ICD-10-CM

## 2020-03-26 DIAGNOSIS — Z6837 Body mass index (BMI) 37.0-37.9, adult: Principal | ICD-10-CM

## 2020-03-26 DIAGNOSIS — N3946 Mixed incontinence: Principal | ICD-10-CM

## 2020-03-26 DIAGNOSIS — G894 Chronic pain syndrome: Principal | ICD-10-CM

## 2020-03-26 DIAGNOSIS — K219 Gastro-esophageal reflux disease without esophagitis: Principal | ICD-10-CM

## 2020-03-26 DIAGNOSIS — E559 Vitamin D deficiency, unspecified: Principal | ICD-10-CM

## 2020-03-26 DIAGNOSIS — M0579 Rheumatoid arthritis with rheumatoid factor of multiple sites without organ or systems involvement: Principal | ICD-10-CM

## 2020-03-26 DIAGNOSIS — L97528 Non-pressure chronic ulcer of other part of left foot with other specified severity: Principal | ICD-10-CM

## 2020-03-26 DIAGNOSIS — I5032 Chronic diastolic (congestive) heart failure: Principal | ICD-10-CM

## 2020-03-26 DIAGNOSIS — E1142 Type 2 diabetes mellitus with diabetic polyneuropathy: Principal | ICD-10-CM

## 2020-03-27 ENCOUNTER — Other Ambulatory Visit: Payer: Self-pay | Admitting: Psychiatry

## 2020-03-27 MED ORDER — METHOTREXATE SODIUM 2.5 MG TABLET
ORAL | 0.00000 days | Status: SS
Start: 2020-03-27 — End: 2020-05-20

## 2020-03-27 MED ORDER — FAMOTIDINE 20 MG TABLET
ORAL_TABLET | 11 refills | 0 days
Start: 2020-03-27 — End: ?

## 2020-03-28 DIAGNOSIS — Z6837 Body mass index (BMI) 37.0-37.9, adult: Principal | ICD-10-CM

## 2020-03-28 DIAGNOSIS — Z7984 Long term (current) use of oral hypoglycemic drugs: Principal | ICD-10-CM

## 2020-03-28 DIAGNOSIS — E1169 Type 2 diabetes mellitus with other specified complication: Principal | ICD-10-CM

## 2020-03-28 DIAGNOSIS — M869 Osteomyelitis, unspecified: Principal | ICD-10-CM

## 2020-03-28 DIAGNOSIS — E1142 Type 2 diabetes mellitus with diabetic polyneuropathy: Principal | ICD-10-CM

## 2020-03-28 DIAGNOSIS — E11621 Type 2 diabetes mellitus with foot ulcer: Principal | ICD-10-CM

## 2020-03-28 DIAGNOSIS — Z794 Long term (current) use of insulin: Principal | ICD-10-CM

## 2020-03-28 DIAGNOSIS — G894 Chronic pain syndrome: Principal | ICD-10-CM

## 2020-03-28 DIAGNOSIS — N3946 Mixed incontinence: Principal | ICD-10-CM

## 2020-03-28 DIAGNOSIS — I5032 Chronic diastolic (congestive) heart failure: Principal | ICD-10-CM

## 2020-03-28 DIAGNOSIS — F419 Anxiety disorder, unspecified: Principal | ICD-10-CM

## 2020-03-28 DIAGNOSIS — M0579 Rheumatoid arthritis with rheumatoid factor of multiple sites without organ or systems involvement: Principal | ICD-10-CM

## 2020-03-28 DIAGNOSIS — L97521 Non-pressure chronic ulcer of other part of left foot limited to breakdown of skin: Principal | ICD-10-CM

## 2020-03-28 DIAGNOSIS — Z7951 Long term (current) use of inhaled steroids: Principal | ICD-10-CM

## 2020-03-28 DIAGNOSIS — L97423 Non-pressure chronic ulcer of left heel and midfoot with necrosis of muscle: Principal | ICD-10-CM

## 2020-03-28 DIAGNOSIS — M86672 Other chronic osteomyelitis, left ankle and foot: Principal | ICD-10-CM

## 2020-03-28 DIAGNOSIS — E559 Vitamin D deficiency, unspecified: Principal | ICD-10-CM

## 2020-03-28 DIAGNOSIS — L97528 Non-pressure chronic ulcer of other part of left foot with other specified severity: Principal | ICD-10-CM

## 2020-03-28 DIAGNOSIS — Z7952 Long term (current) use of systemic steroids: Principal | ICD-10-CM

## 2020-03-28 DIAGNOSIS — E1165 Type 2 diabetes mellitus with hyperglycemia: Principal | ICD-10-CM

## 2020-03-28 DIAGNOSIS — E039 Hypothyroidism, unspecified: Principal | ICD-10-CM

## 2020-03-28 DIAGNOSIS — Z89429 Acquired absence of other toe(s), unspecified side: Principal | ICD-10-CM

## 2020-03-28 DIAGNOSIS — I110 Hypertensive heart disease with heart failure: Principal | ICD-10-CM

## 2020-03-28 DIAGNOSIS — G4733 Obstructive sleep apnea (adult) (pediatric): Principal | ICD-10-CM

## 2020-03-28 DIAGNOSIS — K219 Gastro-esophageal reflux disease without esophagitis: Principal | ICD-10-CM

## 2020-03-28 DIAGNOSIS — J449 Chronic obstructive pulmonary disease, unspecified: Principal | ICD-10-CM

## 2020-03-28 DIAGNOSIS — F319 Bipolar disorder, unspecified: Principal | ICD-10-CM

## 2020-03-28 MED ORDER — ZINC SULFATE 50 MG ZINC (220 MG) CAPSULE
ORAL_CAPSULE | 2 refills | 0.00000 days | Status: SS
Start: 2020-03-28 — End: ?

## 2020-03-31 DIAGNOSIS — F319 Bipolar disorder, unspecified: Principal | ICD-10-CM

## 2020-03-31 DIAGNOSIS — E1169 Type 2 diabetes mellitus with other specified complication: Principal | ICD-10-CM

## 2020-03-31 DIAGNOSIS — M0579 Rheumatoid arthritis with rheumatoid factor of multiple sites without organ or systems involvement: Principal | ICD-10-CM

## 2020-03-31 DIAGNOSIS — G894 Chronic pain syndrome: Principal | ICD-10-CM

## 2020-03-31 DIAGNOSIS — E1142 Type 2 diabetes mellitus with diabetic polyneuropathy: Principal | ICD-10-CM

## 2020-03-31 DIAGNOSIS — N3946 Mixed incontinence: Principal | ICD-10-CM

## 2020-03-31 DIAGNOSIS — I110 Hypertensive heart disease with heart failure: Principal | ICD-10-CM

## 2020-03-31 DIAGNOSIS — F419 Anxiety disorder, unspecified: Principal | ICD-10-CM

## 2020-03-31 DIAGNOSIS — R7881 Bacteremia: Principal | ICD-10-CM

## 2020-03-31 DIAGNOSIS — J9611 Chronic respiratory failure with hypoxia: Principal | ICD-10-CM

## 2020-03-31 DIAGNOSIS — Z4781 Encounter for orthopedic aftercare following surgical amputation: Principal | ICD-10-CM

## 2020-03-31 DIAGNOSIS — I5032 Chronic diastolic (congestive) heart failure: Principal | ICD-10-CM

## 2020-03-31 DIAGNOSIS — M86171 Other acute osteomyelitis, right ankle and foot: Principal | ICD-10-CM

## 2020-03-31 DIAGNOSIS — Z452 Encounter for adjustment and management of vascular access device: Principal | ICD-10-CM

## 2020-03-31 DIAGNOSIS — L97521 Non-pressure chronic ulcer of other part of left foot limited to breakdown of skin: Principal | ICD-10-CM

## 2020-03-31 DIAGNOSIS — J449 Chronic obstructive pulmonary disease, unspecified: Principal | ICD-10-CM

## 2020-03-31 DIAGNOSIS — G4733 Obstructive sleep apnea (adult) (pediatric): Principal | ICD-10-CM

## 2020-03-31 DIAGNOSIS — K219 Gastro-esophageal reflux disease without esophagitis: Principal | ICD-10-CM

## 2020-03-31 DIAGNOSIS — E039 Hypothyroidism, unspecified: Principal | ICD-10-CM

## 2020-03-31 DIAGNOSIS — B964 Proteus (mirabilis) (morganii) as the cause of diseases classified elsewhere: Principal | ICD-10-CM

## 2020-03-31 DIAGNOSIS — E559 Vitamin D deficiency, unspecified: Principal | ICD-10-CM

## 2020-03-31 DIAGNOSIS — Z4801 Encounter for change or removal of surgical wound dressing: Principal | ICD-10-CM

## 2020-03-31 DIAGNOSIS — E11621 Type 2 diabetes mellitus with foot ulcer: Principal | ICD-10-CM

## 2020-03-31 DIAGNOSIS — Z6837 Body mass index (BMI) 37.0-37.9, adult: Principal | ICD-10-CM

## 2020-04-01 ENCOUNTER — Encounter: Admit: 2020-04-01 | Discharge: 2020-04-30 | Payer: MEDICAID

## 2020-04-01 DIAGNOSIS — E039 Hypothyroidism, unspecified: Principal | ICD-10-CM

## 2020-04-01 DIAGNOSIS — G894 Chronic pain syndrome: Principal | ICD-10-CM

## 2020-04-01 DIAGNOSIS — M86171 Other acute osteomyelitis, right ankle and foot: Principal | ICD-10-CM

## 2020-04-01 DIAGNOSIS — J9611 Chronic respiratory failure with hypoxia: Principal | ICD-10-CM

## 2020-04-01 DIAGNOSIS — B964 Proteus (mirabilis) (morganii) as the cause of diseases classified elsewhere: Principal | ICD-10-CM

## 2020-04-01 DIAGNOSIS — J449 Chronic obstructive pulmonary disease, unspecified: Principal | ICD-10-CM

## 2020-04-01 DIAGNOSIS — E1169 Type 2 diabetes mellitus with other specified complication: Principal | ICD-10-CM

## 2020-04-01 DIAGNOSIS — F419 Anxiety disorder, unspecified: Principal | ICD-10-CM

## 2020-04-01 DIAGNOSIS — N3946 Mixed incontinence: Principal | ICD-10-CM

## 2020-04-01 DIAGNOSIS — E559 Vitamin D deficiency, unspecified: Principal | ICD-10-CM

## 2020-04-01 DIAGNOSIS — G4733 Obstructive sleep apnea (adult) (pediatric): Principal | ICD-10-CM

## 2020-04-01 DIAGNOSIS — I110 Hypertensive heart disease with heart failure: Principal | ICD-10-CM

## 2020-04-01 DIAGNOSIS — I5032 Chronic diastolic (congestive) heart failure: Principal | ICD-10-CM

## 2020-04-01 DIAGNOSIS — Z4801 Encounter for change or removal of surgical wound dressing: Principal | ICD-10-CM

## 2020-04-01 DIAGNOSIS — Z452 Encounter for adjustment and management of vascular access device: Principal | ICD-10-CM

## 2020-04-01 DIAGNOSIS — K219 Gastro-esophageal reflux disease without esophagitis: Principal | ICD-10-CM

## 2020-04-01 DIAGNOSIS — Z4781 Encounter for orthopedic aftercare following surgical amputation: Principal | ICD-10-CM

## 2020-04-01 DIAGNOSIS — Z6837 Body mass index (BMI) 37.0-37.9, adult: Principal | ICD-10-CM

## 2020-04-01 DIAGNOSIS — E11621 Type 2 diabetes mellitus with foot ulcer: Principal | ICD-10-CM

## 2020-04-01 DIAGNOSIS — F319 Bipolar disorder, unspecified: Principal | ICD-10-CM

## 2020-04-01 DIAGNOSIS — M0579 Rheumatoid arthritis with rheumatoid factor of multiple sites without organ or systems involvement: Principal | ICD-10-CM

## 2020-04-01 DIAGNOSIS — E1142 Type 2 diabetes mellitus with diabetic polyneuropathy: Principal | ICD-10-CM

## 2020-04-01 DIAGNOSIS — R7881 Bacteremia: Principal | ICD-10-CM

## 2020-04-01 DIAGNOSIS — L97521 Non-pressure chronic ulcer of other part of left foot limited to breakdown of skin: Principal | ICD-10-CM

## 2020-04-02 ENCOUNTER — Other Ambulatory Visit: Admit: 2020-04-02 | Discharge: 2020-04-03 | Payer: MEDICAID

## 2020-04-02 DIAGNOSIS — Z4801 Encounter for change or removal of surgical wound dressing: Principal | ICD-10-CM

## 2020-04-02 DIAGNOSIS — E039 Hypothyroidism, unspecified: Principal | ICD-10-CM

## 2020-04-02 DIAGNOSIS — L97521 Non-pressure chronic ulcer of other part of left foot limited to breakdown of skin: Principal | ICD-10-CM

## 2020-04-02 DIAGNOSIS — R7881 Bacteremia: Principal | ICD-10-CM

## 2020-04-02 DIAGNOSIS — Z4781 Encounter for orthopedic aftercare following surgical amputation: Principal | ICD-10-CM

## 2020-04-02 DIAGNOSIS — M0579 Rheumatoid arthritis with rheumatoid factor of multiple sites without organ or systems involvement: Principal | ICD-10-CM

## 2020-04-02 DIAGNOSIS — F319 Bipolar disorder, unspecified: Principal | ICD-10-CM

## 2020-04-02 DIAGNOSIS — Z452 Encounter for adjustment and management of vascular access device: Principal | ICD-10-CM

## 2020-04-02 DIAGNOSIS — J9611 Chronic respiratory failure with hypoxia: Principal | ICD-10-CM

## 2020-04-02 DIAGNOSIS — G4733 Obstructive sleep apnea (adult) (pediatric): Principal | ICD-10-CM

## 2020-04-02 DIAGNOSIS — G894 Chronic pain syndrome: Principal | ICD-10-CM

## 2020-04-02 DIAGNOSIS — E11621 Type 2 diabetes mellitus with foot ulcer: Principal | ICD-10-CM

## 2020-04-02 DIAGNOSIS — B964 Proteus (mirabilis) (morganii) as the cause of diseases classified elsewhere: Principal | ICD-10-CM

## 2020-04-02 DIAGNOSIS — Z6837 Body mass index (BMI) 37.0-37.9, adult: Principal | ICD-10-CM

## 2020-04-02 DIAGNOSIS — I110 Hypertensive heart disease with heart failure: Principal | ICD-10-CM

## 2020-04-02 DIAGNOSIS — N3946 Mixed incontinence: Principal | ICD-10-CM

## 2020-04-02 DIAGNOSIS — J449 Chronic obstructive pulmonary disease, unspecified: Principal | ICD-10-CM

## 2020-04-02 DIAGNOSIS — I5032 Chronic diastolic (congestive) heart failure: Principal | ICD-10-CM

## 2020-04-02 DIAGNOSIS — M86171 Other acute osteomyelitis, right ankle and foot: Principal | ICD-10-CM

## 2020-04-02 DIAGNOSIS — E1142 Type 2 diabetes mellitus with diabetic polyneuropathy: Principal | ICD-10-CM

## 2020-04-02 DIAGNOSIS — E1169 Type 2 diabetes mellitus with other specified complication: Principal | ICD-10-CM

## 2020-04-02 DIAGNOSIS — E559 Vitamin D deficiency, unspecified: Principal | ICD-10-CM

## 2020-04-02 DIAGNOSIS — K219 Gastro-esophageal reflux disease without esophagitis: Principal | ICD-10-CM

## 2020-04-02 DIAGNOSIS — F419 Anxiety disorder, unspecified: Principal | ICD-10-CM

## 2020-04-04 ENCOUNTER — Ambulatory Visit
Admit: 2020-04-04 | Discharge: 2020-04-05 | Payer: MEDICAID | Attending: Foot & Ankle Surgery | Primary: Foot & Ankle Surgery

## 2020-04-04 DIAGNOSIS — G894 Chronic pain syndrome: Principal | ICD-10-CM

## 2020-04-04 DIAGNOSIS — Z452 Encounter for adjustment and management of vascular access device: Principal | ICD-10-CM

## 2020-04-04 DIAGNOSIS — B964 Proteus (mirabilis) (morganii) as the cause of diseases classified elsewhere: Principal | ICD-10-CM

## 2020-04-04 DIAGNOSIS — J449 Chronic obstructive pulmonary disease, unspecified: Principal | ICD-10-CM

## 2020-04-04 DIAGNOSIS — M79674 Pain in right toe(s): Principal | ICD-10-CM

## 2020-04-04 DIAGNOSIS — E1142 Type 2 diabetes mellitus with diabetic polyneuropathy: Principal | ICD-10-CM

## 2020-04-04 DIAGNOSIS — E1169 Type 2 diabetes mellitus with other specified complication: Principal | ICD-10-CM

## 2020-04-04 DIAGNOSIS — I110 Hypertensive heart disease with heart failure: Principal | ICD-10-CM

## 2020-04-04 DIAGNOSIS — L97521 Non-pressure chronic ulcer of other part of left foot limited to breakdown of skin: Principal | ICD-10-CM

## 2020-04-04 DIAGNOSIS — Z8614 Personal history of Methicillin resistant Staphylococcus aureus infection: Principal | ICD-10-CM

## 2020-04-04 DIAGNOSIS — M86171 Other acute osteomyelitis, right ankle and foot: Principal | ICD-10-CM

## 2020-04-04 DIAGNOSIS — N3946 Mixed incontinence: Principal | ICD-10-CM

## 2020-04-04 DIAGNOSIS — S91109A Unspecified open wound of unspecified toe(s) without damage to nail, initial encounter: Principal | ICD-10-CM

## 2020-04-04 DIAGNOSIS — I5032 Chronic diastolic (congestive) heart failure: Principal | ICD-10-CM

## 2020-04-04 DIAGNOSIS — R7881 Bacteremia: Principal | ICD-10-CM

## 2020-04-04 DIAGNOSIS — E039 Hypothyroidism, unspecified: Principal | ICD-10-CM

## 2020-04-04 DIAGNOSIS — J9611 Chronic respiratory failure with hypoxia: Principal | ICD-10-CM

## 2020-04-04 DIAGNOSIS — L97522 Non-pressure chronic ulcer of other part of left foot with fat layer exposed: Principal | ICD-10-CM

## 2020-04-04 DIAGNOSIS — L97422 Non-pressure chronic ulcer of left heel and midfoot with fat layer exposed: Principal | ICD-10-CM

## 2020-04-04 DIAGNOSIS — L97529 Non-pressure chronic ulcer of other part of left foot with unspecified severity: Principal | ICD-10-CM

## 2020-04-04 DIAGNOSIS — Z1810 Retained metal fragments, unspecified: Principal | ICD-10-CM

## 2020-04-04 DIAGNOSIS — M069 Rheumatoid arthritis, unspecified: Principal | ICD-10-CM

## 2020-04-04 DIAGNOSIS — E11620 Type 2 diabetes mellitus with diabetic dermatitis: Principal | ICD-10-CM

## 2020-04-04 DIAGNOSIS — G4733 Obstructive sleep apnea (adult) (pediatric): Principal | ICD-10-CM

## 2020-04-04 DIAGNOSIS — Z4781 Encounter for orthopedic aftercare following surgical amputation: Principal | ICD-10-CM

## 2020-04-04 DIAGNOSIS — S90425A Blister (nonthermal), left lesser toe(s), initial encounter: Principal | ICD-10-CM

## 2020-04-04 DIAGNOSIS — E11621 Type 2 diabetes mellitus with foot ulcer: Principal | ICD-10-CM

## 2020-04-04 DIAGNOSIS — L03811 Cellulitis of head [any part, except face]: Principal | ICD-10-CM

## 2020-04-04 DIAGNOSIS — Z89419 Acquired absence of unspecified great toe: Principal | ICD-10-CM

## 2020-04-04 DIAGNOSIS — F419 Anxiety disorder, unspecified: Principal | ICD-10-CM

## 2020-04-04 DIAGNOSIS — E559 Vitamin D deficiency, unspecified: Principal | ICD-10-CM

## 2020-04-04 DIAGNOSIS — E1140 Type 2 diabetes mellitus with diabetic neuropathy, unspecified: Principal | ICD-10-CM

## 2020-04-04 DIAGNOSIS — E1152 Type 2 diabetes mellitus with diabetic peripheral angiopathy with gangrene: Principal | ICD-10-CM

## 2020-04-04 DIAGNOSIS — Z6837 Body mass index (BMI) 37.0-37.9, adult: Principal | ICD-10-CM

## 2020-04-04 DIAGNOSIS — F319 Bipolar disorder, unspecified: Principal | ICD-10-CM

## 2020-04-04 DIAGNOSIS — K219 Gastro-esophageal reflux disease without esophagitis: Principal | ICD-10-CM

## 2020-04-04 DIAGNOSIS — Z89412 Acquired absence of left great toe: Principal | ICD-10-CM

## 2020-04-04 DIAGNOSIS — M0579 Rheumatoid arthritis with rheumatoid factor of multiple sites without organ or systems involvement: Principal | ICD-10-CM

## 2020-04-04 DIAGNOSIS — M79675 Pain in left toe(s): Principal | ICD-10-CM

## 2020-04-04 DIAGNOSIS — E1165 Type 2 diabetes mellitus with hyperglycemia: Principal | ICD-10-CM

## 2020-04-04 DIAGNOSIS — Z4801 Encounter for change or removal of surgical wound dressing: Principal | ICD-10-CM

## 2020-04-05 DIAGNOSIS — M0579 Rheumatoid arthritis with rheumatoid factor of multiple sites without organ or systems involvement: Principal | ICD-10-CM

## 2020-04-05 DIAGNOSIS — I5032 Chronic diastolic (congestive) heart failure: Principal | ICD-10-CM

## 2020-04-05 DIAGNOSIS — G4733 Obstructive sleep apnea (adult) (pediatric): Principal | ICD-10-CM

## 2020-04-05 DIAGNOSIS — J9611 Chronic respiratory failure with hypoxia: Principal | ICD-10-CM

## 2020-04-05 DIAGNOSIS — N3946 Mixed incontinence: Principal | ICD-10-CM

## 2020-04-05 DIAGNOSIS — E1169 Type 2 diabetes mellitus with other specified complication: Principal | ICD-10-CM

## 2020-04-05 DIAGNOSIS — B964 Proteus (mirabilis) (morganii) as the cause of diseases classified elsewhere: Principal | ICD-10-CM

## 2020-04-05 DIAGNOSIS — K219 Gastro-esophageal reflux disease without esophagitis: Principal | ICD-10-CM

## 2020-04-05 DIAGNOSIS — R7881 Bacteremia: Principal | ICD-10-CM

## 2020-04-05 DIAGNOSIS — M86171 Other acute osteomyelitis, right ankle and foot: Principal | ICD-10-CM

## 2020-04-05 DIAGNOSIS — E559 Vitamin D deficiency, unspecified: Principal | ICD-10-CM

## 2020-04-05 DIAGNOSIS — Z6837 Body mass index (BMI) 37.0-37.9, adult: Principal | ICD-10-CM

## 2020-04-05 DIAGNOSIS — I110 Hypertensive heart disease with heart failure: Principal | ICD-10-CM

## 2020-04-05 DIAGNOSIS — J449 Chronic obstructive pulmonary disease, unspecified: Principal | ICD-10-CM

## 2020-04-05 DIAGNOSIS — Z4781 Encounter for orthopedic aftercare following surgical amputation: Principal | ICD-10-CM

## 2020-04-05 DIAGNOSIS — F419 Anxiety disorder, unspecified: Principal | ICD-10-CM

## 2020-04-05 DIAGNOSIS — G894 Chronic pain syndrome: Principal | ICD-10-CM

## 2020-04-05 DIAGNOSIS — F319 Bipolar disorder, unspecified: Principal | ICD-10-CM

## 2020-04-05 DIAGNOSIS — E039 Hypothyroidism, unspecified: Principal | ICD-10-CM

## 2020-04-05 DIAGNOSIS — Z452 Encounter for adjustment and management of vascular access device: Principal | ICD-10-CM

## 2020-04-05 DIAGNOSIS — E1142 Type 2 diabetes mellitus with diabetic polyneuropathy: Principal | ICD-10-CM

## 2020-04-05 DIAGNOSIS — Z4801 Encounter for change or removal of surgical wound dressing: Principal | ICD-10-CM

## 2020-04-05 DIAGNOSIS — E11621 Type 2 diabetes mellitus with foot ulcer: Principal | ICD-10-CM

## 2020-04-05 DIAGNOSIS — L97521 Non-pressure chronic ulcer of other part of left foot limited to breakdown of skin: Principal | ICD-10-CM

## 2020-04-07 ENCOUNTER — Ambulatory Visit: Admit: 2020-04-07 | Discharge: 2020-04-08 | Payer: MEDICAID

## 2020-04-07 ENCOUNTER — Encounter: Admit: 2020-04-07 | Discharge: 2020-04-07 | Payer: MEDICAID

## 2020-04-07 DIAGNOSIS — E11621 Type 2 diabetes mellitus with foot ulcer: Principal | ICD-10-CM

## 2020-04-07 DIAGNOSIS — J449 Chronic obstructive pulmonary disease, unspecified: Principal | ICD-10-CM

## 2020-04-07 DIAGNOSIS — Z4781 Encounter for orthopedic aftercare following surgical amputation: Principal | ICD-10-CM

## 2020-04-07 DIAGNOSIS — I110 Hypertensive heart disease with heart failure: Principal | ICD-10-CM

## 2020-04-07 DIAGNOSIS — R7881 Bacteremia: Principal | ICD-10-CM

## 2020-04-07 DIAGNOSIS — E039 Hypothyroidism, unspecified: Principal | ICD-10-CM

## 2020-04-07 DIAGNOSIS — K219 Gastro-esophageal reflux disease without esophagitis: Principal | ICD-10-CM

## 2020-04-07 DIAGNOSIS — G894 Chronic pain syndrome: Principal | ICD-10-CM

## 2020-04-07 DIAGNOSIS — Z4801 Encounter for change or removal of surgical wound dressing: Principal | ICD-10-CM

## 2020-04-07 DIAGNOSIS — M0579 Rheumatoid arthritis with rheumatoid factor of multiple sites without organ or systems involvement: Principal | ICD-10-CM

## 2020-04-07 DIAGNOSIS — F419 Anxiety disorder, unspecified: Principal | ICD-10-CM

## 2020-04-07 DIAGNOSIS — E1142 Type 2 diabetes mellitus with diabetic polyneuropathy: Principal | ICD-10-CM

## 2020-04-07 DIAGNOSIS — M869 Osteomyelitis, unspecified: Principal | ICD-10-CM

## 2020-04-07 DIAGNOSIS — J9611 Chronic respiratory failure with hypoxia: Principal | ICD-10-CM

## 2020-04-07 DIAGNOSIS — Z452 Encounter for adjustment and management of vascular access device: Principal | ICD-10-CM

## 2020-04-07 DIAGNOSIS — E1169 Type 2 diabetes mellitus with other specified complication: Principal | ICD-10-CM

## 2020-04-07 DIAGNOSIS — I5032 Chronic diastolic (congestive) heart failure: Principal | ICD-10-CM

## 2020-04-07 DIAGNOSIS — L97521 Non-pressure chronic ulcer of other part of left foot limited to breakdown of skin: Principal | ICD-10-CM

## 2020-04-07 DIAGNOSIS — G4733 Obstructive sleep apnea (adult) (pediatric): Principal | ICD-10-CM

## 2020-04-07 DIAGNOSIS — M86171 Other acute osteomyelitis, right ankle and foot: Principal | ICD-10-CM

## 2020-04-07 DIAGNOSIS — N3946 Mixed incontinence: Principal | ICD-10-CM

## 2020-04-07 DIAGNOSIS — E559 Vitamin D deficiency, unspecified: Principal | ICD-10-CM

## 2020-04-07 DIAGNOSIS — F319 Bipolar disorder, unspecified: Principal | ICD-10-CM

## 2020-04-07 DIAGNOSIS — Z6837 Body mass index (BMI) 37.0-37.9, adult: Principal | ICD-10-CM

## 2020-04-07 DIAGNOSIS — B964 Proteus (mirabilis) (morganii) as the cause of diseases classified elsewhere: Principal | ICD-10-CM

## 2020-04-08 ENCOUNTER — Ambulatory Visit: Admit: 2020-04-08 | Discharge: 2020-04-09 | Payer: MEDICAID

## 2020-04-08 DIAGNOSIS — Z20822 Contact with and (suspected) exposure to covid-19: Principal | ICD-10-CM

## 2020-04-08 DIAGNOSIS — E11621 Type 2 diabetes mellitus with foot ulcer: Principal | ICD-10-CM

## 2020-04-08 DIAGNOSIS — J449 Chronic obstructive pulmonary disease, unspecified: Principal | ICD-10-CM

## 2020-04-08 DIAGNOSIS — R6520 Severe sepsis without septic shock: Principal | ICD-10-CM

## 2020-04-08 DIAGNOSIS — E1142 Type 2 diabetes mellitus with diabetic polyneuropathy: Principal | ICD-10-CM

## 2020-04-08 DIAGNOSIS — K219 Gastro-esophageal reflux disease without esophagitis: Principal | ICD-10-CM

## 2020-04-08 DIAGNOSIS — M1990 Unspecified osteoarthritis, unspecified site: Principal | ICD-10-CM

## 2020-04-08 DIAGNOSIS — N3946 Mixed incontinence: Principal | ICD-10-CM

## 2020-04-08 DIAGNOSIS — R7402 Elevation of levels of lactic acid dehydrogenase (LDH): Principal | ICD-10-CM

## 2020-04-08 DIAGNOSIS — G4733 Obstructive sleep apnea (adult) (pediatric): Principal | ICD-10-CM

## 2020-04-08 DIAGNOSIS — R0902 Hypoxemia: Principal | ICD-10-CM

## 2020-04-08 DIAGNOSIS — Z9842 Cataract extraction status, left eye: Principal | ICD-10-CM

## 2020-04-08 DIAGNOSIS — N139 Obstructive and reflux uropathy, unspecified: Principal | ICD-10-CM

## 2020-04-08 DIAGNOSIS — Z8616 Personal history of COVID-19: Principal | ICD-10-CM

## 2020-04-08 DIAGNOSIS — Z90710 Acquired absence of both cervix and uterus: Principal | ICD-10-CM

## 2020-04-08 DIAGNOSIS — Z6841 Body Mass Index (BMI) 40.0 and over, adult: Principal | ICD-10-CM

## 2020-04-08 DIAGNOSIS — I110 Hypertensive heart disease with heart failure: Principal | ICD-10-CM

## 2020-04-08 DIAGNOSIS — F419 Anxiety disorder, unspecified: Principal | ICD-10-CM

## 2020-04-08 DIAGNOSIS — M86672 Other chronic osteomyelitis, left ankle and foot: Principal | ICD-10-CM

## 2020-04-08 DIAGNOSIS — Z882 Allergy status to sulfonamides status: Principal | ICD-10-CM

## 2020-04-08 DIAGNOSIS — L97423 Non-pressure chronic ulcer of left heel and midfoot with necrosis of muscle: Principal | ICD-10-CM

## 2020-04-08 DIAGNOSIS — I272 Pulmonary hypertension, unspecified: Principal | ICD-10-CM

## 2020-04-08 DIAGNOSIS — R7881 Bacteremia: Principal | ICD-10-CM

## 2020-04-08 DIAGNOSIS — F319 Bipolar disorder, unspecified: Principal | ICD-10-CM

## 2020-04-08 DIAGNOSIS — J8410 Pulmonary fibrosis, unspecified: Principal | ICD-10-CM

## 2020-04-08 DIAGNOSIS — E11628 Type 2 diabetes mellitus with other skin complications: Principal | ICD-10-CM

## 2020-04-08 DIAGNOSIS — G894 Chronic pain syndrome: Principal | ICD-10-CM

## 2020-04-08 DIAGNOSIS — E785 Hyperlipidemia, unspecified: Principal | ICD-10-CM

## 2020-04-08 DIAGNOSIS — Z452 Encounter for adjustment and management of vascular access device: Principal | ICD-10-CM

## 2020-04-08 DIAGNOSIS — L97521 Non-pressure chronic ulcer of other part of left foot limited to breakdown of skin: Principal | ICD-10-CM

## 2020-04-08 DIAGNOSIS — L89629 Pressure ulcer of left heel, unspecified stage: Principal | ICD-10-CM

## 2020-04-08 DIAGNOSIS — Z23 Encounter for immunization: Principal | ICD-10-CM

## 2020-04-08 DIAGNOSIS — I5032 Chronic diastolic (congestive) heart failure: Principal | ICD-10-CM

## 2020-04-08 DIAGNOSIS — Z881 Allergy status to other antibiotic agents status: Principal | ICD-10-CM

## 2020-04-08 DIAGNOSIS — E039 Hypothyroidism, unspecified: Principal | ICD-10-CM

## 2020-04-08 DIAGNOSIS — Z4801 Encounter for change or removal of surgical wound dressing: Principal | ICD-10-CM

## 2020-04-08 DIAGNOSIS — Z6837 Body mass index (BMI) 37.0-37.9, adult: Principal | ICD-10-CM

## 2020-04-08 DIAGNOSIS — Z87891 Personal history of nicotine dependence: Principal | ICD-10-CM

## 2020-04-08 DIAGNOSIS — M0579 Rheumatoid arthritis with rheumatoid factor of multiple sites without organ or systems involvement: Principal | ICD-10-CM

## 2020-04-08 DIAGNOSIS — M86171 Other acute osteomyelitis, right ankle and foot: Principal | ICD-10-CM

## 2020-04-08 DIAGNOSIS — F331 Major depressive disorder, recurrent, moderate: Principal | ICD-10-CM

## 2020-04-08 DIAGNOSIS — Z885 Allergy status to narcotic agent status: Principal | ICD-10-CM

## 2020-04-08 DIAGNOSIS — Z961 Presence of intraocular lens: Principal | ICD-10-CM

## 2020-04-08 DIAGNOSIS — E1165 Type 2 diabetes mellitus with hyperglycemia: Principal | ICD-10-CM

## 2020-04-08 DIAGNOSIS — Z9049 Acquired absence of other specified parts of digestive tract: Principal | ICD-10-CM

## 2020-04-08 DIAGNOSIS — E1169 Type 2 diabetes mellitus with other specified complication: Principal | ICD-10-CM

## 2020-04-08 DIAGNOSIS — Z4781 Encounter for orthopedic aftercare following surgical amputation: Principal | ICD-10-CM

## 2020-04-08 DIAGNOSIS — Z9841 Cataract extraction status, right eye: Principal | ICD-10-CM

## 2020-04-08 DIAGNOSIS — Z794 Long term (current) use of insulin: Principal | ICD-10-CM

## 2020-04-08 DIAGNOSIS — J9611 Chronic respiratory failure with hypoxia: Principal | ICD-10-CM

## 2020-04-08 DIAGNOSIS — M868X7 Other osteomyelitis, ankle and foot: Principal | ICD-10-CM

## 2020-04-08 DIAGNOSIS — M069 Rheumatoid arthritis, unspecified: Principal | ICD-10-CM

## 2020-04-08 DIAGNOSIS — B964 Proteus (mirabilis) (morganii) as the cause of diseases classified elsewhere: Principal | ICD-10-CM

## 2020-04-08 DIAGNOSIS — Z886 Allergy status to analgesic agent status: Principal | ICD-10-CM

## 2020-04-08 DIAGNOSIS — Z90722 Acquired absence of ovaries, bilateral: Principal | ICD-10-CM

## 2020-04-08 DIAGNOSIS — A419 Sepsis, unspecified organism: Principal | ICD-10-CM

## 2020-04-08 DIAGNOSIS — E559 Vitamin D deficiency, unspecified: Principal | ICD-10-CM

## 2020-04-09 ENCOUNTER — Encounter
Admit: 2020-04-09 | Discharge: 2020-04-19 | Disposition: A | Discharge status: Home Health | Payer: MEDICAID | Attending: Certified Registered" | Admitting: Student in an Organized Health Care Education/Training Program

## 2020-04-09 ENCOUNTER — Encounter
Admit: 2020-04-09 | Discharge: 2020-04-19 | Disposition: A | Discharge status: Home Health | Payer: MEDICAID | Admitting: Student in an Organized Health Care Education/Training Program

## 2020-04-09 ENCOUNTER — Ambulatory Visit
Admit: 2020-04-09 | Discharge: 2020-04-19 | Disposition: A | Discharge status: Home Health | Payer: MEDICAID | Admitting: Student in an Organized Health Care Education/Training Program

## 2020-04-09 DIAGNOSIS — Z886 Allergy status to analgesic agent status: Principal | ICD-10-CM

## 2020-04-09 DIAGNOSIS — E11621 Type 2 diabetes mellitus with foot ulcer: Principal | ICD-10-CM

## 2020-04-09 DIAGNOSIS — B964 Proteus (mirabilis) (morganii) as the cause of diseases classified elsewhere: Principal | ICD-10-CM

## 2020-04-09 DIAGNOSIS — R7402 Elevation of levels of lactic acid dehydrogenase (LDH): Principal | ICD-10-CM

## 2020-04-09 DIAGNOSIS — Z20822 Contact with and (suspected) exposure to covid-19: Principal | ICD-10-CM

## 2020-04-09 DIAGNOSIS — F331 Major depressive disorder, recurrent, moderate: Principal | ICD-10-CM

## 2020-04-09 DIAGNOSIS — I110 Hypertensive heart disease with heart failure: Principal | ICD-10-CM

## 2020-04-09 DIAGNOSIS — F319 Bipolar disorder, unspecified: Principal | ICD-10-CM

## 2020-04-09 DIAGNOSIS — G894 Chronic pain syndrome: Principal | ICD-10-CM

## 2020-04-09 DIAGNOSIS — Z90722 Acquired absence of ovaries, bilateral: Principal | ICD-10-CM

## 2020-04-09 DIAGNOSIS — N3946 Mixed incontinence: Principal | ICD-10-CM

## 2020-04-09 DIAGNOSIS — Z885 Allergy status to narcotic agent status: Principal | ICD-10-CM

## 2020-04-09 DIAGNOSIS — I272 Pulmonary hypertension, unspecified: Principal | ICD-10-CM

## 2020-04-09 DIAGNOSIS — R7881 Bacteremia: Principal | ICD-10-CM

## 2020-04-09 DIAGNOSIS — Z87891 Personal history of nicotine dependence: Principal | ICD-10-CM

## 2020-04-09 DIAGNOSIS — K219 Gastro-esophageal reflux disease without esophagitis: Principal | ICD-10-CM

## 2020-04-09 DIAGNOSIS — Z90710 Acquired absence of both cervix and uterus: Principal | ICD-10-CM

## 2020-04-09 DIAGNOSIS — L97521 Non-pressure chronic ulcer of other part of left foot limited to breakdown of skin: Principal | ICD-10-CM

## 2020-04-09 DIAGNOSIS — Z9841 Cataract extraction status, right eye: Principal | ICD-10-CM

## 2020-04-09 DIAGNOSIS — Z961 Presence of intraocular lens: Principal | ICD-10-CM

## 2020-04-09 DIAGNOSIS — E1142 Type 2 diabetes mellitus with diabetic polyneuropathy: Principal | ICD-10-CM

## 2020-04-09 DIAGNOSIS — M86171 Other acute osteomyelitis, right ankle and foot: Principal | ICD-10-CM

## 2020-04-09 DIAGNOSIS — M1990 Unspecified osteoarthritis, unspecified site: Principal | ICD-10-CM

## 2020-04-09 DIAGNOSIS — J8410 Pulmonary fibrosis, unspecified: Principal | ICD-10-CM

## 2020-04-09 DIAGNOSIS — G4733 Obstructive sleep apnea (adult) (pediatric): Principal | ICD-10-CM

## 2020-04-09 DIAGNOSIS — Z9842 Cataract extraction status, left eye: Principal | ICD-10-CM

## 2020-04-09 DIAGNOSIS — F419 Anxiety disorder, unspecified: Principal | ICD-10-CM

## 2020-04-09 DIAGNOSIS — Z882 Allergy status to sulfonamides status: Principal | ICD-10-CM

## 2020-04-09 DIAGNOSIS — Z6837 Body mass index (BMI) 37.0-37.9, adult: Principal | ICD-10-CM

## 2020-04-09 DIAGNOSIS — Z4781 Encounter for orthopedic aftercare following surgical amputation: Principal | ICD-10-CM

## 2020-04-09 DIAGNOSIS — Z6841 Body Mass Index (BMI) 40.0 and over, adult: Principal | ICD-10-CM

## 2020-04-09 DIAGNOSIS — E039 Hypothyroidism, unspecified: Principal | ICD-10-CM

## 2020-04-09 DIAGNOSIS — Z8616 Personal history of COVID-19: Principal | ICD-10-CM

## 2020-04-09 DIAGNOSIS — E1169 Type 2 diabetes mellitus with other specified complication: Principal | ICD-10-CM

## 2020-04-09 DIAGNOSIS — R6520 Severe sepsis without septic shock: Principal | ICD-10-CM

## 2020-04-09 DIAGNOSIS — E11628 Type 2 diabetes mellitus with other skin complications: Principal | ICD-10-CM

## 2020-04-09 DIAGNOSIS — Z794 Long term (current) use of insulin: Principal | ICD-10-CM

## 2020-04-09 DIAGNOSIS — N139 Obstructive and reflux uropathy, unspecified: Principal | ICD-10-CM

## 2020-04-09 DIAGNOSIS — R0902 Hypoxemia: Principal | ICD-10-CM

## 2020-04-09 DIAGNOSIS — I5032 Chronic diastolic (congestive) heart failure: Principal | ICD-10-CM

## 2020-04-09 DIAGNOSIS — E785 Hyperlipidemia, unspecified: Principal | ICD-10-CM

## 2020-04-09 DIAGNOSIS — Z881 Allergy status to other antibiotic agents status: Principal | ICD-10-CM

## 2020-04-09 DIAGNOSIS — L89629 Pressure ulcer of left heel, unspecified stage: Principal | ICD-10-CM

## 2020-04-09 DIAGNOSIS — Z9049 Acquired absence of other specified parts of digestive tract: Principal | ICD-10-CM

## 2020-04-09 DIAGNOSIS — J449 Chronic obstructive pulmonary disease, unspecified: Principal | ICD-10-CM

## 2020-04-09 DIAGNOSIS — Z452 Encounter for adjustment and management of vascular access device: Principal | ICD-10-CM

## 2020-04-09 DIAGNOSIS — M0579 Rheumatoid arthritis with rheumatoid factor of multiple sites without organ or systems involvement: Principal | ICD-10-CM

## 2020-04-09 DIAGNOSIS — A419 Sepsis, unspecified organism: Principal | ICD-10-CM

## 2020-04-09 DIAGNOSIS — M869 Osteomyelitis, unspecified: Principal | ICD-10-CM

## 2020-04-09 DIAGNOSIS — E1165 Type 2 diabetes mellitus with hyperglycemia: Principal | ICD-10-CM

## 2020-04-09 DIAGNOSIS — M868X7 Other osteomyelitis, ankle and foot: Principal | ICD-10-CM

## 2020-04-09 DIAGNOSIS — J9611 Chronic respiratory failure with hypoxia: Principal | ICD-10-CM

## 2020-04-09 DIAGNOSIS — L089 Local infection of the skin and subcutaneous tissue, unspecified: Principal | ICD-10-CM

## 2020-04-09 DIAGNOSIS — Z4801 Encounter for change or removal of surgical wound dressing: Principal | ICD-10-CM

## 2020-04-09 DIAGNOSIS — M069 Rheumatoid arthritis, unspecified: Principal | ICD-10-CM

## 2020-04-09 DIAGNOSIS — L97422 Non-pressure chronic ulcer of left heel and midfoot with fat layer exposed: Principal | ICD-10-CM

## 2020-04-09 DIAGNOSIS — E559 Vitamin D deficiency, unspecified: Principal | ICD-10-CM

## 2020-04-09 DIAGNOSIS — L97513 Non-pressure chronic ulcer of other part of right foot with necrosis of muscle: Principal | ICD-10-CM

## 2020-04-09 DIAGNOSIS — L03311 Cellulitis of abdominal wall: Principal | ICD-10-CM

## 2020-04-11 DIAGNOSIS — M86171 Other acute osteomyelitis, right ankle and foot: Principal | ICD-10-CM

## 2020-04-11 DIAGNOSIS — G4733 Obstructive sleep apnea (adult) (pediatric): Principal | ICD-10-CM

## 2020-04-11 DIAGNOSIS — N3946 Mixed incontinence: Principal | ICD-10-CM

## 2020-04-11 DIAGNOSIS — Z4801 Encounter for change or removal of surgical wound dressing: Principal | ICD-10-CM

## 2020-04-11 DIAGNOSIS — G894 Chronic pain syndrome: Principal | ICD-10-CM

## 2020-04-11 DIAGNOSIS — J9611 Chronic respiratory failure with hypoxia: Principal | ICD-10-CM

## 2020-04-11 DIAGNOSIS — E559 Vitamin D deficiency, unspecified: Principal | ICD-10-CM

## 2020-04-11 DIAGNOSIS — Z6837 Body mass index (BMI) 37.0-37.9, adult: Principal | ICD-10-CM

## 2020-04-11 DIAGNOSIS — M0579 Rheumatoid arthritis with rheumatoid factor of multiple sites without organ or systems involvement: Principal | ICD-10-CM

## 2020-04-11 DIAGNOSIS — Z4781 Encounter for orthopedic aftercare following surgical amputation: Principal | ICD-10-CM

## 2020-04-11 DIAGNOSIS — I110 Hypertensive heart disease with heart failure: Principal | ICD-10-CM

## 2020-04-11 DIAGNOSIS — F419 Anxiety disorder, unspecified: Principal | ICD-10-CM

## 2020-04-11 DIAGNOSIS — E11621 Type 2 diabetes mellitus with foot ulcer: Principal | ICD-10-CM

## 2020-04-11 DIAGNOSIS — L97521 Non-pressure chronic ulcer of other part of left foot limited to breakdown of skin: Principal | ICD-10-CM

## 2020-04-11 DIAGNOSIS — E1142 Type 2 diabetes mellitus with diabetic polyneuropathy: Principal | ICD-10-CM

## 2020-04-11 DIAGNOSIS — E039 Hypothyroidism, unspecified: Principal | ICD-10-CM

## 2020-04-11 DIAGNOSIS — B964 Proteus (mirabilis) (morganii) as the cause of diseases classified elsewhere: Principal | ICD-10-CM

## 2020-04-11 DIAGNOSIS — E1169 Type 2 diabetes mellitus with other specified complication: Principal | ICD-10-CM

## 2020-04-11 DIAGNOSIS — J449 Chronic obstructive pulmonary disease, unspecified: Principal | ICD-10-CM

## 2020-04-11 DIAGNOSIS — R7881 Bacteremia: Principal | ICD-10-CM

## 2020-04-11 DIAGNOSIS — K219 Gastro-esophageal reflux disease without esophagitis: Principal | ICD-10-CM

## 2020-04-11 DIAGNOSIS — Z452 Encounter for adjustment and management of vascular access device: Principal | ICD-10-CM

## 2020-04-11 DIAGNOSIS — I5032 Chronic diastolic (congestive) heart failure: Principal | ICD-10-CM

## 2020-04-11 DIAGNOSIS — F319 Bipolar disorder, unspecified: Principal | ICD-10-CM

## 2020-04-15 DIAGNOSIS — Z87891 Personal history of nicotine dependence: Principal | ICD-10-CM

## 2020-04-15 DIAGNOSIS — F331 Major depressive disorder, recurrent, moderate: Principal | ICD-10-CM

## 2020-04-15 DIAGNOSIS — L89629 Pressure ulcer of left heel, unspecified stage: Principal | ICD-10-CM

## 2020-04-15 DIAGNOSIS — Z90710 Acquired absence of both cervix and uterus: Principal | ICD-10-CM

## 2020-04-15 DIAGNOSIS — E785 Hyperlipidemia, unspecified: Principal | ICD-10-CM

## 2020-04-15 DIAGNOSIS — Z8616 Personal history of COVID-19: Principal | ICD-10-CM

## 2020-04-15 DIAGNOSIS — E039 Hypothyroidism, unspecified: Principal | ICD-10-CM

## 2020-04-15 DIAGNOSIS — R7402 Elevation of levels of lactic acid dehydrogenase (LDH): Principal | ICD-10-CM

## 2020-04-15 DIAGNOSIS — A419 Sepsis, unspecified organism: Principal | ICD-10-CM

## 2020-04-15 DIAGNOSIS — Z882 Allergy status to sulfonamides status: Principal | ICD-10-CM

## 2020-04-15 DIAGNOSIS — Z6841 Body Mass Index (BMI) 40.0 and over, adult: Principal | ICD-10-CM

## 2020-04-15 DIAGNOSIS — F419 Anxiety disorder, unspecified: Principal | ICD-10-CM

## 2020-04-15 DIAGNOSIS — Z90722 Acquired absence of ovaries, bilateral: Principal | ICD-10-CM

## 2020-04-15 DIAGNOSIS — E1142 Type 2 diabetes mellitus with diabetic polyneuropathy: Principal | ICD-10-CM

## 2020-04-15 DIAGNOSIS — R6520 Severe sepsis without septic shock: Principal | ICD-10-CM

## 2020-04-15 DIAGNOSIS — Z885 Allergy status to narcotic agent status: Principal | ICD-10-CM

## 2020-04-15 DIAGNOSIS — Z881 Allergy status to other antibiotic agents status: Principal | ICD-10-CM

## 2020-04-15 DIAGNOSIS — Z20822 Contact with and (suspected) exposure to covid-19: Principal | ICD-10-CM

## 2020-04-15 DIAGNOSIS — M868X7 Other osteomyelitis, ankle and foot: Principal | ICD-10-CM

## 2020-04-15 DIAGNOSIS — M1990 Unspecified osteoarthritis, unspecified site: Principal | ICD-10-CM

## 2020-04-15 DIAGNOSIS — G894 Chronic pain syndrome: Principal | ICD-10-CM

## 2020-04-15 DIAGNOSIS — Z9842 Cataract extraction status, left eye: Principal | ICD-10-CM

## 2020-04-15 DIAGNOSIS — I272 Pulmonary hypertension, unspecified: Principal | ICD-10-CM

## 2020-04-15 DIAGNOSIS — Z794 Long term (current) use of insulin: Principal | ICD-10-CM

## 2020-04-15 DIAGNOSIS — M069 Rheumatoid arthritis, unspecified: Principal | ICD-10-CM

## 2020-04-15 DIAGNOSIS — Z9049 Acquired absence of other specified parts of digestive tract: Principal | ICD-10-CM

## 2020-04-15 DIAGNOSIS — J449 Chronic obstructive pulmonary disease, unspecified: Principal | ICD-10-CM

## 2020-04-15 DIAGNOSIS — I5032 Chronic diastolic (congestive) heart failure: Principal | ICD-10-CM

## 2020-04-15 DIAGNOSIS — G4733 Obstructive sleep apnea (adult) (pediatric): Principal | ICD-10-CM

## 2020-04-15 DIAGNOSIS — E11621 Type 2 diabetes mellitus with foot ulcer: Principal | ICD-10-CM

## 2020-04-15 DIAGNOSIS — E1165 Type 2 diabetes mellitus with hyperglycemia: Principal | ICD-10-CM

## 2020-04-15 DIAGNOSIS — Z886 Allergy status to analgesic agent status: Principal | ICD-10-CM

## 2020-04-15 DIAGNOSIS — Z9841 Cataract extraction status, right eye: Principal | ICD-10-CM

## 2020-04-15 DIAGNOSIS — Z961 Presence of intraocular lens: Principal | ICD-10-CM

## 2020-04-15 DIAGNOSIS — J8410 Pulmonary fibrosis, unspecified: Principal | ICD-10-CM

## 2020-04-15 DIAGNOSIS — N3946 Mixed incontinence: Principal | ICD-10-CM

## 2020-04-15 DIAGNOSIS — I110 Hypertensive heart disease with heart failure: Principal | ICD-10-CM

## 2020-04-15 DIAGNOSIS — N139 Obstructive and reflux uropathy, unspecified: Principal | ICD-10-CM

## 2020-04-15 DIAGNOSIS — E11628 Type 2 diabetes mellitus with other skin complications: Principal | ICD-10-CM

## 2020-04-15 DIAGNOSIS — K219 Gastro-esophageal reflux disease without esophagitis: Principal | ICD-10-CM

## 2020-04-15 DIAGNOSIS — R0902 Hypoxemia: Principal | ICD-10-CM

## 2020-04-16 MED ORDER — FAMOTIDINE 20 MG TABLET
ORAL_TABLET | Freq: Every day | ORAL | 5 refills | 28.00000 days | Status: SS
Start: 2020-04-16 — End: 2021-04-16

## 2020-04-17 DIAGNOSIS — M1990 Unspecified osteoarthritis, unspecified site: Principal | ICD-10-CM

## 2020-04-17 DIAGNOSIS — Z882 Allergy status to sulfonamides status: Principal | ICD-10-CM

## 2020-04-17 DIAGNOSIS — M069 Rheumatoid arthritis, unspecified: Principal | ICD-10-CM

## 2020-04-17 DIAGNOSIS — E039 Hypothyroidism, unspecified: Principal | ICD-10-CM

## 2020-04-17 DIAGNOSIS — Z886 Allergy status to analgesic agent status: Principal | ICD-10-CM

## 2020-04-17 DIAGNOSIS — M868X7 Other osteomyelitis, ankle and foot: Principal | ICD-10-CM

## 2020-04-17 DIAGNOSIS — Z90710 Acquired absence of both cervix and uterus: Principal | ICD-10-CM

## 2020-04-17 DIAGNOSIS — R6520 Severe sepsis without septic shock: Principal | ICD-10-CM

## 2020-04-17 DIAGNOSIS — E11621 Type 2 diabetes mellitus with foot ulcer: Principal | ICD-10-CM

## 2020-04-17 DIAGNOSIS — Z87891 Personal history of nicotine dependence: Principal | ICD-10-CM

## 2020-04-17 DIAGNOSIS — Z9841 Cataract extraction status, right eye: Principal | ICD-10-CM

## 2020-04-17 DIAGNOSIS — R7402 Elevation of levels of lactic acid dehydrogenase (LDH): Principal | ICD-10-CM

## 2020-04-17 DIAGNOSIS — F331 Major depressive disorder, recurrent, moderate: Principal | ICD-10-CM

## 2020-04-17 DIAGNOSIS — J8410 Pulmonary fibrosis, unspecified: Principal | ICD-10-CM

## 2020-04-17 DIAGNOSIS — Z20822 Contact with and (suspected) exposure to covid-19: Principal | ICD-10-CM

## 2020-04-17 DIAGNOSIS — E1165 Type 2 diabetes mellitus with hyperglycemia: Principal | ICD-10-CM

## 2020-04-17 DIAGNOSIS — I110 Hypertensive heart disease with heart failure: Principal | ICD-10-CM

## 2020-04-17 DIAGNOSIS — N139 Obstructive and reflux uropathy, unspecified: Principal | ICD-10-CM

## 2020-04-17 DIAGNOSIS — R0902 Hypoxemia: Principal | ICD-10-CM

## 2020-04-17 DIAGNOSIS — F419 Anxiety disorder, unspecified: Principal | ICD-10-CM

## 2020-04-17 DIAGNOSIS — Z8616 Personal history of COVID-19: Principal | ICD-10-CM

## 2020-04-17 DIAGNOSIS — E785 Hyperlipidemia, unspecified: Principal | ICD-10-CM

## 2020-04-17 DIAGNOSIS — Z881 Allergy status to other antibiotic agents status: Principal | ICD-10-CM

## 2020-04-17 DIAGNOSIS — I5032 Chronic diastolic (congestive) heart failure: Principal | ICD-10-CM

## 2020-04-17 DIAGNOSIS — L89629 Pressure ulcer of left heel, unspecified stage: Principal | ICD-10-CM

## 2020-04-17 DIAGNOSIS — K219 Gastro-esophageal reflux disease without esophagitis: Principal | ICD-10-CM

## 2020-04-17 DIAGNOSIS — N3946 Mixed incontinence: Principal | ICD-10-CM

## 2020-04-17 DIAGNOSIS — Z9842 Cataract extraction status, left eye: Principal | ICD-10-CM

## 2020-04-17 DIAGNOSIS — Z794 Long term (current) use of insulin: Principal | ICD-10-CM

## 2020-04-17 DIAGNOSIS — G4733 Obstructive sleep apnea (adult) (pediatric): Principal | ICD-10-CM

## 2020-04-17 DIAGNOSIS — Z961 Presence of intraocular lens: Principal | ICD-10-CM

## 2020-04-17 DIAGNOSIS — Z6841 Body Mass Index (BMI) 40.0 and over, adult: Principal | ICD-10-CM

## 2020-04-17 DIAGNOSIS — E11628 Type 2 diabetes mellitus with other skin complications: Principal | ICD-10-CM

## 2020-04-17 DIAGNOSIS — Z9049 Acquired absence of other specified parts of digestive tract: Principal | ICD-10-CM

## 2020-04-17 DIAGNOSIS — A419 Sepsis, unspecified organism: Principal | ICD-10-CM

## 2020-04-17 DIAGNOSIS — E1142 Type 2 diabetes mellitus with diabetic polyneuropathy: Principal | ICD-10-CM

## 2020-04-17 DIAGNOSIS — Z885 Allergy status to narcotic agent status: Principal | ICD-10-CM

## 2020-04-17 DIAGNOSIS — I272 Pulmonary hypertension, unspecified: Principal | ICD-10-CM

## 2020-04-17 DIAGNOSIS — G894 Chronic pain syndrome: Principal | ICD-10-CM

## 2020-04-17 DIAGNOSIS — Z90722 Acquired absence of ovaries, bilateral: Principal | ICD-10-CM

## 2020-04-17 DIAGNOSIS — J449 Chronic obstructive pulmonary disease, unspecified: Principal | ICD-10-CM

## 2020-04-18 MED ORDER — FIDAXOMICIN 200 MG TABLET
ORAL_TABLET | Freq: Two times a day (BID) | ORAL | 0 refills | 10 days | Status: CP
Start: 2020-04-18 — End: 2020-04-18

## 2020-04-18 MED ORDER — OXYCODONE 15 MG TABLET
ORAL_TABLET | ORAL | 0 refills | 4 days | Status: CP | PRN
Start: 2020-04-18 — End: 2020-04-18

## 2020-04-18 MED ORDER — VANCOMYCIN 25 MG/ML ORAL SOLUTION: 125 mg | mL | Freq: Four times a day (QID) | 0 refills | 15 days | Status: AC

## 2020-04-18 MED ORDER — CEFEPIME 2 GRAM/100 ML IN DEXTROSE (ISO-OSMOTIC) INTRAVENOUS PIGGYBACK
Freq: Three times a day (TID) | INTRAVENOUS | 0 refills | 0.00000 days | Status: SS
Start: 2020-04-18 — End: 2020-05-14

## 2020-04-18 MED ORDER — VANCOMYCIN 1.25 GRAM/250 ML IN 0.9 % SODIUM CHLORIDE INTRAVENOUS
Freq: Two times a day (BID) | INTRAVENOUS | 0 refills | 0 days
Start: 2020-04-18 — End: 2020-04-19

## 2020-04-18 MED ORDER — METRONIDAZOLE 500 MG TABLET: 500 mg | tablet | Freq: Three times a day (TID) | 0 refills | 28 days | Status: AC

## 2020-04-18 MED ORDER — METRONIDAZOLE 500 MG TABLET
ORAL_TABLET | Freq: Three times a day (TID) | ORAL | 0 refills | 28.00000 days | Status: CP
Start: 2020-04-18 — End: 2020-05-16
  Filled 2020-04-19: qty 90, 30d supply, fill #0

## 2020-04-18 MED ORDER — VANCOMYCIN 25 MG/ML ORAL SOLUTION
Freq: Four times a day (QID) | ORAL | 0 refills | 15.00000 days | Status: CP
Start: 2020-04-18 — End: 2020-04-18

## 2020-04-19 MED ORDER — VANCOMYCIN 25 MG/ML ORAL SOLUTION
Freq: Four times a day (QID) | ORAL | 0 refills | 15 days | Status: CP
Start: 2020-04-19 — End: 2020-05-04
  Filled 2020-04-19: qty 300, 15d supply, fill #0

## 2020-04-19 MED ORDER — METRONIDAZOLE 500 MG TABLET
ORAL_TABLET | Freq: Three times a day (TID) | ORAL | 0 refills | 30.00000 days | Status: CP
Start: 2020-04-19 — End: 2020-05-19

## 2020-04-19 MED ORDER — VANCOMYCIN 1.25 GRAM/250 ML IN 0.9 % SODIUM CHLORIDE INTRAVENOUS
Freq: Two times a day (BID) | INTRAVENOUS | 0 refills | 0 days
Start: 2020-04-19 — End: 2020-05-14

## 2020-04-19 MED FILL — FIRVANQ 25 MG/ML ORAL SOLUTION: 15 days supply | Qty: 300 | Fill #0 | Status: AC

## 2020-04-19 MED FILL — METRONIDAZOLE 500 MG TABLET: 30 days supply | Qty: 90 | Fill #0 | Status: AC

## 2020-04-21 ENCOUNTER — Encounter: Admit: 2020-04-21 | Discharge: 2020-04-21 | Payer: MEDICAID | Attending: Family | Primary: Family

## 2020-04-21 DIAGNOSIS — G894 Chronic pain syndrome: Principal | ICD-10-CM

## 2020-04-21 DIAGNOSIS — J9611 Chronic respiratory failure with hypoxia: Principal | ICD-10-CM

## 2020-04-21 DIAGNOSIS — M86171 Other acute osteomyelitis, right ankle and foot: Principal | ICD-10-CM

## 2020-04-21 DIAGNOSIS — Z4801 Encounter for change or removal of surgical wound dressing: Principal | ICD-10-CM

## 2020-04-21 DIAGNOSIS — E559 Vitamin D deficiency, unspecified: Principal | ICD-10-CM

## 2020-04-21 DIAGNOSIS — E1142 Type 2 diabetes mellitus with diabetic polyneuropathy: Principal | ICD-10-CM

## 2020-04-21 DIAGNOSIS — Z6837 Body mass index (BMI) 37.0-37.9, adult: Principal | ICD-10-CM

## 2020-04-21 DIAGNOSIS — I110 Hypertensive heart disease with heart failure: Principal | ICD-10-CM

## 2020-04-21 DIAGNOSIS — E1169 Type 2 diabetes mellitus with other specified complication: Principal | ICD-10-CM

## 2020-04-21 DIAGNOSIS — B964 Proteus (mirabilis) (morganii) as the cause of diseases classified elsewhere: Principal | ICD-10-CM

## 2020-04-21 DIAGNOSIS — N3946 Mixed incontinence: Principal | ICD-10-CM

## 2020-04-21 DIAGNOSIS — E11621 Type 2 diabetes mellitus with foot ulcer: Principal | ICD-10-CM

## 2020-04-21 DIAGNOSIS — F319 Bipolar disorder, unspecified: Principal | ICD-10-CM

## 2020-04-21 DIAGNOSIS — R7881 Bacteremia: Principal | ICD-10-CM

## 2020-04-21 DIAGNOSIS — J449 Chronic obstructive pulmonary disease, unspecified: Principal | ICD-10-CM

## 2020-04-21 DIAGNOSIS — M0579 Rheumatoid arthritis with rheumatoid factor of multiple sites without organ or systems involvement: Principal | ICD-10-CM

## 2020-04-21 DIAGNOSIS — Z452 Encounter for adjustment and management of vascular access device: Principal | ICD-10-CM

## 2020-04-21 DIAGNOSIS — I5032 Chronic diastolic (congestive) heart failure: Principal | ICD-10-CM

## 2020-04-21 DIAGNOSIS — K219 Gastro-esophageal reflux disease without esophagitis: Principal | ICD-10-CM

## 2020-04-21 DIAGNOSIS — L97521 Non-pressure chronic ulcer of other part of left foot limited to breakdown of skin: Principal | ICD-10-CM

## 2020-04-21 DIAGNOSIS — Z4781 Encounter for orthopedic aftercare following surgical amputation: Principal | ICD-10-CM

## 2020-04-21 DIAGNOSIS — F419 Anxiety disorder, unspecified: Principal | ICD-10-CM

## 2020-04-21 DIAGNOSIS — G4733 Obstructive sleep apnea (adult) (pediatric): Principal | ICD-10-CM

## 2020-04-21 DIAGNOSIS — E039 Hypothyroidism, unspecified: Principal | ICD-10-CM

## 2020-04-22 DIAGNOSIS — E039 Hypothyroidism, unspecified: Principal | ICD-10-CM

## 2020-04-22 DIAGNOSIS — F319 Bipolar disorder, unspecified: Principal | ICD-10-CM

## 2020-04-22 DIAGNOSIS — E559 Vitamin D deficiency, unspecified: Principal | ICD-10-CM

## 2020-04-22 DIAGNOSIS — G894 Chronic pain syndrome: Principal | ICD-10-CM

## 2020-04-22 DIAGNOSIS — K219 Gastro-esophageal reflux disease without esophagitis: Principal | ICD-10-CM

## 2020-04-22 DIAGNOSIS — I5032 Chronic diastolic (congestive) heart failure: Principal | ICD-10-CM

## 2020-04-22 DIAGNOSIS — I110 Hypertensive heart disease with heart failure: Principal | ICD-10-CM

## 2020-04-22 DIAGNOSIS — J9611 Chronic respiratory failure with hypoxia: Principal | ICD-10-CM

## 2020-04-22 DIAGNOSIS — Z452 Encounter for adjustment and management of vascular access device: Principal | ICD-10-CM

## 2020-04-22 DIAGNOSIS — F419 Anxiety disorder, unspecified: Principal | ICD-10-CM

## 2020-04-22 DIAGNOSIS — Z6837 Body mass index (BMI) 37.0-37.9, adult: Principal | ICD-10-CM

## 2020-04-22 DIAGNOSIS — R7881 Bacteremia: Principal | ICD-10-CM

## 2020-04-22 DIAGNOSIS — B964 Proteus (mirabilis) (morganii) as the cause of diseases classified elsewhere: Principal | ICD-10-CM

## 2020-04-22 DIAGNOSIS — Z4801 Encounter for change or removal of surgical wound dressing: Principal | ICD-10-CM

## 2020-04-22 DIAGNOSIS — E11621 Type 2 diabetes mellitus with foot ulcer: Principal | ICD-10-CM

## 2020-04-22 DIAGNOSIS — G4733 Obstructive sleep apnea (adult) (pediatric): Principal | ICD-10-CM

## 2020-04-22 DIAGNOSIS — Z4781 Encounter for orthopedic aftercare following surgical amputation: Principal | ICD-10-CM

## 2020-04-22 DIAGNOSIS — E1169 Type 2 diabetes mellitus with other specified complication: Principal | ICD-10-CM

## 2020-04-22 DIAGNOSIS — L97521 Non-pressure chronic ulcer of other part of left foot limited to breakdown of skin: Principal | ICD-10-CM

## 2020-04-22 DIAGNOSIS — E1142 Type 2 diabetes mellitus with diabetic polyneuropathy: Principal | ICD-10-CM

## 2020-04-22 DIAGNOSIS — M0579 Rheumatoid arthritis with rheumatoid factor of multiple sites without organ or systems involvement: Principal | ICD-10-CM

## 2020-04-22 DIAGNOSIS — N3946 Mixed incontinence: Principal | ICD-10-CM

## 2020-04-22 DIAGNOSIS — M86171 Other acute osteomyelitis, right ankle and foot: Principal | ICD-10-CM

## 2020-04-22 DIAGNOSIS — J449 Chronic obstructive pulmonary disease, unspecified: Principal | ICD-10-CM

## 2020-04-23 DIAGNOSIS — E1169 Type 2 diabetes mellitus with other specified complication: Principal | ICD-10-CM

## 2020-04-23 DIAGNOSIS — J449 Chronic obstructive pulmonary disease, unspecified: Principal | ICD-10-CM

## 2020-04-23 DIAGNOSIS — N3946 Mixed incontinence: Principal | ICD-10-CM

## 2020-04-23 DIAGNOSIS — J9611 Chronic respiratory failure with hypoxia: Principal | ICD-10-CM

## 2020-04-23 DIAGNOSIS — G4733 Obstructive sleep apnea (adult) (pediatric): Principal | ICD-10-CM

## 2020-04-23 DIAGNOSIS — E11621 Type 2 diabetes mellitus with foot ulcer: Principal | ICD-10-CM

## 2020-04-23 DIAGNOSIS — I110 Hypertensive heart disease with heart failure: Principal | ICD-10-CM

## 2020-04-23 DIAGNOSIS — F419 Anxiety disorder, unspecified: Principal | ICD-10-CM

## 2020-04-23 DIAGNOSIS — E1142 Type 2 diabetes mellitus with diabetic polyneuropathy: Principal | ICD-10-CM

## 2020-04-23 DIAGNOSIS — Z6837 Body mass index (BMI) 37.0-37.9, adult: Principal | ICD-10-CM

## 2020-04-23 DIAGNOSIS — E559 Vitamin D deficiency, unspecified: Principal | ICD-10-CM

## 2020-04-23 DIAGNOSIS — M86171 Other acute osteomyelitis, right ankle and foot: Principal | ICD-10-CM

## 2020-04-23 DIAGNOSIS — Z452 Encounter for adjustment and management of vascular access device: Principal | ICD-10-CM

## 2020-04-23 DIAGNOSIS — Z4781 Encounter for orthopedic aftercare following surgical amputation: Principal | ICD-10-CM

## 2020-04-23 DIAGNOSIS — G894 Chronic pain syndrome: Principal | ICD-10-CM

## 2020-04-23 DIAGNOSIS — L97521 Non-pressure chronic ulcer of other part of left foot limited to breakdown of skin: Principal | ICD-10-CM

## 2020-04-23 DIAGNOSIS — K219 Gastro-esophageal reflux disease without esophagitis: Principal | ICD-10-CM

## 2020-04-23 DIAGNOSIS — M0579 Rheumatoid arthritis with rheumatoid factor of multiple sites without organ or systems involvement: Principal | ICD-10-CM

## 2020-04-23 DIAGNOSIS — F319 Bipolar disorder, unspecified: Principal | ICD-10-CM

## 2020-04-23 DIAGNOSIS — E039 Hypothyroidism, unspecified: Principal | ICD-10-CM

## 2020-04-23 DIAGNOSIS — B964 Proteus (mirabilis) (morganii) as the cause of diseases classified elsewhere: Principal | ICD-10-CM

## 2020-04-23 DIAGNOSIS — Z4801 Encounter for change or removal of surgical wound dressing: Principal | ICD-10-CM

## 2020-04-23 DIAGNOSIS — I5032 Chronic diastolic (congestive) heart failure: Principal | ICD-10-CM

## 2020-04-23 DIAGNOSIS — R7881 Bacteremia: Principal | ICD-10-CM

## 2020-04-24 DIAGNOSIS — M86171 Other acute osteomyelitis, right ankle and foot: Principal | ICD-10-CM

## 2020-04-24 DIAGNOSIS — F419 Anxiety disorder, unspecified: Principal | ICD-10-CM

## 2020-04-24 DIAGNOSIS — Z6837 Body mass index (BMI) 37.0-37.9, adult: Principal | ICD-10-CM

## 2020-04-24 DIAGNOSIS — R7881 Bacteremia: Principal | ICD-10-CM

## 2020-04-24 DIAGNOSIS — Z4781 Encounter for orthopedic aftercare following surgical amputation: Principal | ICD-10-CM

## 2020-04-24 DIAGNOSIS — E1142 Type 2 diabetes mellitus with diabetic polyneuropathy: Principal | ICD-10-CM

## 2020-04-24 DIAGNOSIS — B964 Proteus (mirabilis) (morganii) as the cause of diseases classified elsewhere: Principal | ICD-10-CM

## 2020-04-24 DIAGNOSIS — L97521 Non-pressure chronic ulcer of other part of left foot limited to breakdown of skin: Principal | ICD-10-CM

## 2020-04-24 DIAGNOSIS — I110 Hypertensive heart disease with heart failure: Principal | ICD-10-CM

## 2020-04-24 DIAGNOSIS — J9611 Chronic respiratory failure with hypoxia: Principal | ICD-10-CM

## 2020-04-24 DIAGNOSIS — Z452 Encounter for adjustment and management of vascular access device: Principal | ICD-10-CM

## 2020-04-24 DIAGNOSIS — I5032 Chronic diastolic (congestive) heart failure: Principal | ICD-10-CM

## 2020-04-24 DIAGNOSIS — E11621 Type 2 diabetes mellitus with foot ulcer: Principal | ICD-10-CM

## 2020-04-24 DIAGNOSIS — G4733 Obstructive sleep apnea (adult) (pediatric): Principal | ICD-10-CM

## 2020-04-24 DIAGNOSIS — G894 Chronic pain syndrome: Principal | ICD-10-CM

## 2020-04-24 DIAGNOSIS — E1169 Type 2 diabetes mellitus with other specified complication: Principal | ICD-10-CM

## 2020-04-24 DIAGNOSIS — J449 Chronic obstructive pulmonary disease, unspecified: Principal | ICD-10-CM

## 2020-04-24 DIAGNOSIS — K219 Gastro-esophageal reflux disease without esophagitis: Principal | ICD-10-CM

## 2020-04-24 DIAGNOSIS — E559 Vitamin D deficiency, unspecified: Principal | ICD-10-CM

## 2020-04-24 DIAGNOSIS — N3946 Mixed incontinence: Principal | ICD-10-CM

## 2020-04-24 DIAGNOSIS — Z4801 Encounter for change or removal of surgical wound dressing: Principal | ICD-10-CM

## 2020-04-24 DIAGNOSIS — M0579 Rheumatoid arthritis with rheumatoid factor of multiple sites without organ or systems involvement: Principal | ICD-10-CM

## 2020-04-24 DIAGNOSIS — E039 Hypothyroidism, unspecified: Principal | ICD-10-CM

## 2020-04-24 DIAGNOSIS — F319 Bipolar disorder, unspecified: Principal | ICD-10-CM

## 2020-04-25 ENCOUNTER — Ambulatory Visit
Admit: 2020-04-25 | Discharge: 2020-04-26 | Payer: MEDICAID | Attending: Foot & Ankle Surgery | Primary: Foot & Ankle Surgery

## 2020-04-25 DIAGNOSIS — M069 Rheumatoid arthritis, unspecified: Principal | ICD-10-CM

## 2020-04-25 DIAGNOSIS — Z189 Retained foreign body fragments, unspecified material: Principal | ICD-10-CM

## 2020-04-25 DIAGNOSIS — L97522 Non-pressure chronic ulcer of other part of left foot with fat layer exposed: Principal | ICD-10-CM

## 2020-04-25 DIAGNOSIS — S91105A Unspecified open wound of left lesser toe(s) without damage to nail, initial encounter: Principal | ICD-10-CM

## 2020-04-25 DIAGNOSIS — Z89412 Acquired absence of left great toe: Principal | ICD-10-CM

## 2020-04-25 DIAGNOSIS — Z9889 Other specified postprocedural states: Principal | ICD-10-CM

## 2020-04-25 DIAGNOSIS — E559 Vitamin D deficiency, unspecified: Principal | ICD-10-CM

## 2020-04-25 DIAGNOSIS — E1165 Type 2 diabetes mellitus with hyperglycemia: Principal | ICD-10-CM

## 2020-04-25 DIAGNOSIS — E039 Hypothyroidism, unspecified: Principal | ICD-10-CM

## 2020-04-25 DIAGNOSIS — G894 Chronic pain syndrome: Principal | ICD-10-CM

## 2020-04-25 DIAGNOSIS — Z89419 Acquired absence of unspecified great toe: Principal | ICD-10-CM

## 2020-04-25 DIAGNOSIS — M79674 Pain in right toe(s): Principal | ICD-10-CM

## 2020-04-25 DIAGNOSIS — E1152 Type 2 diabetes mellitus with diabetic peripheral angiopathy with gangrene: Principal | ICD-10-CM

## 2020-04-25 DIAGNOSIS — L97422 Non-pressure chronic ulcer of left heel and midfoot with fat layer exposed: Principal | ICD-10-CM

## 2020-04-25 DIAGNOSIS — E1140 Type 2 diabetes mellitus with diabetic neuropathy, unspecified: Principal | ICD-10-CM

## 2020-04-25 DIAGNOSIS — S90425A Blister (nonthermal), left lesser toe(s), initial encounter: Principal | ICD-10-CM

## 2020-04-25 DIAGNOSIS — E11621 Type 2 diabetes mellitus with foot ulcer: Principal | ICD-10-CM

## 2020-04-25 DIAGNOSIS — M79675 Pain in left toe(s): Principal | ICD-10-CM

## 2020-04-25 DIAGNOSIS — Z8614 Personal history of Methicillin resistant Staphylococcus aureus infection: Principal | ICD-10-CM

## 2020-04-25 DIAGNOSIS — I5032 Chronic diastolic (congestive) heart failure: Principal | ICD-10-CM

## 2020-04-28 ENCOUNTER — Encounter: Payer: Self-pay | Admitting: Radiology

## 2020-04-28 ENCOUNTER — Emergency Department: Payer: Self-pay

## 2020-04-28 ENCOUNTER — Emergency Department: Payer: Medicaid Other

## 2020-04-28 ENCOUNTER — Other Ambulatory Visit: Payer: Self-pay

## 2020-04-28 ENCOUNTER — Emergency Department
Admission: EM | Admit: 2020-04-28 | Discharge: 2020-04-28 | Disposition: A | Discharge status: Home / Self Care | Payer: Medicaid Other | Attending: Emergency Medicine | Admitting: Emergency Medicine

## 2020-04-28 ENCOUNTER — Encounter
Admit: 2020-04-28 | Discharge: 2020-05-14 | Disposition: A | Discharge status: Home Health | Payer: MEDICAID | Attending: Anesthesiology

## 2020-04-28 ENCOUNTER — Ambulatory Visit: Admit: 2020-04-28 | Discharge: 2020-05-14 | Disposition: A | Discharge status: Home Health | Payer: MEDICAID

## 2020-04-28 DIAGNOSIS — Z794 Long term (current) use of insulin: Principal | ICD-10-CM

## 2020-04-28 DIAGNOSIS — R739 Hyperglycemia, unspecified: Principal | ICD-10-CM

## 2020-04-28 DIAGNOSIS — E876 Hypokalemia: Principal | ICD-10-CM

## 2020-04-28 DIAGNOSIS — J9611 Chronic respiratory failure with hypoxia: Principal | ICD-10-CM

## 2020-04-28 DIAGNOSIS — M160 Bilateral primary osteoarthritis of hip: Principal | ICD-10-CM

## 2020-04-28 DIAGNOSIS — L97509 Non-pressure chronic ulcer of other part of unspecified foot with unspecified severity: Principal | ICD-10-CM

## 2020-04-28 DIAGNOSIS — R7881 Bacteremia: Principal | ICD-10-CM

## 2020-04-28 DIAGNOSIS — N3946 Mixed incontinence: Principal | ICD-10-CM

## 2020-04-28 DIAGNOSIS — E11621 Type 2 diabetes mellitus with foot ulcer: Principal | ICD-10-CM

## 2020-04-28 DIAGNOSIS — Z4781 Encounter for orthopedic aftercare following surgical amputation: Principal | ICD-10-CM

## 2020-04-28 DIAGNOSIS — F419 Anxiety disorder, unspecified: Principal | ICD-10-CM

## 2020-04-28 DIAGNOSIS — I110 Hypertensive heart disease with heart failure: Principal | ICD-10-CM

## 2020-04-28 DIAGNOSIS — Z20822 Contact with and (suspected) exposure to covid-19: Principal | ICD-10-CM

## 2020-04-28 DIAGNOSIS — Z87891 Personal history of nicotine dependence: Principal | ICD-10-CM

## 2020-04-28 DIAGNOSIS — R21 Rash and other nonspecific skin eruption: Principal | ICD-10-CM

## 2020-04-28 DIAGNOSIS — K219 Gastro-esophageal reflux disease without esophagitis: Principal | ICD-10-CM

## 2020-04-28 DIAGNOSIS — E039 Hypothyroidism, unspecified: Principal | ICD-10-CM

## 2020-04-28 DIAGNOSIS — N139 Obstructive and reflux uropathy, unspecified: Principal | ICD-10-CM

## 2020-04-28 DIAGNOSIS — Z4801 Encounter for change or removal of surgical wound dressing: Principal | ICD-10-CM

## 2020-04-28 DIAGNOSIS — E1169 Type 2 diabetes mellitus with other specified complication: Principal | ICD-10-CM

## 2020-04-28 DIAGNOSIS — F319 Bipolar disorder, unspecified: Principal | ICD-10-CM

## 2020-04-28 DIAGNOSIS — I878 Other specified disorders of veins: Principal | ICD-10-CM

## 2020-04-28 DIAGNOSIS — J449 Chronic obstructive pulmonary disease, unspecified: Principal | ICD-10-CM

## 2020-04-28 DIAGNOSIS — M069 Rheumatoid arthritis, unspecified: Principal | ICD-10-CM

## 2020-04-28 DIAGNOSIS — I2510 Atherosclerotic heart disease of native coronary artery without angina pectoris: Principal | ICD-10-CM

## 2020-04-28 DIAGNOSIS — G2581 Restless legs syndrome: Principal | ICD-10-CM

## 2020-04-28 DIAGNOSIS — G4700 Insomnia, unspecified: Principal | ICD-10-CM

## 2020-04-28 DIAGNOSIS — A4189 Other specified sepsis: Principal | ICD-10-CM

## 2020-04-28 DIAGNOSIS — N179 Acute kidney failure, unspecified: Principal | ICD-10-CM

## 2020-04-28 DIAGNOSIS — K449 Diaphragmatic hernia without obstruction or gangrene: Principal | ICD-10-CM

## 2020-04-28 DIAGNOSIS — J8410 Pulmonary fibrosis, unspecified: Principal | ICD-10-CM

## 2020-04-28 DIAGNOSIS — M86171 Other acute osteomyelitis, right ankle and foot: Principal | ICD-10-CM

## 2020-04-28 DIAGNOSIS — G894 Chronic pain syndrome: Principal | ICD-10-CM

## 2020-04-28 DIAGNOSIS — M25511 Pain in right shoulder: Principal | ICD-10-CM

## 2020-04-28 DIAGNOSIS — R4182 Altered mental status, unspecified: Principal | ICD-10-CM

## 2020-04-28 DIAGNOSIS — Z6841 Body Mass Index (BMI) 40.0 and over, adult: Principal | ICD-10-CM

## 2020-04-28 DIAGNOSIS — I5032 Chronic diastolic (congestive) heart failure: Principal | ICD-10-CM

## 2020-04-28 DIAGNOSIS — E1142 Type 2 diabetes mellitus with diabetic polyneuropathy: Principal | ICD-10-CM

## 2020-04-28 DIAGNOSIS — L03115 Cellulitis of right lower limb: Principal | ICD-10-CM

## 2020-04-28 DIAGNOSIS — G928 Other toxic encephalopathy: Principal | ICD-10-CM

## 2020-04-28 DIAGNOSIS — U099 Post covid-19 condition, unspecified: Principal | ICD-10-CM

## 2020-04-28 DIAGNOSIS — L03116 Cellulitis of left lower limb: Principal | ICD-10-CM

## 2020-04-28 DIAGNOSIS — E1165 Type 2 diabetes mellitus with hyperglycemia: Principal | ICD-10-CM

## 2020-04-28 DIAGNOSIS — L89629 Pressure ulcer of left heel, unspecified stage: Principal | ICD-10-CM

## 2020-04-28 DIAGNOSIS — E662 Morbid (severe) obesity with alveolar hypoventilation: Principal | ICD-10-CM

## 2020-04-28 DIAGNOSIS — Z9119 Patient's noncompliance with other medical treatment and regimen: Principal | ICD-10-CM

## 2020-04-28 DIAGNOSIS — G4733 Obstructive sleep apnea (adult) (pediatric): Principal | ICD-10-CM

## 2020-04-28 DIAGNOSIS — Z89429 Acquired absence of other toe(s), unspecified side: Principal | ICD-10-CM

## 2020-04-28 DIAGNOSIS — Z6837 Body mass index (BMI) 37.0-37.9, adult: Principal | ICD-10-CM

## 2020-04-28 DIAGNOSIS — K439 Ventral hernia without obstruction or gangrene: Principal | ICD-10-CM

## 2020-04-28 DIAGNOSIS — M869 Osteomyelitis, unspecified: Principal | ICD-10-CM

## 2020-04-28 DIAGNOSIS — A4902 Methicillin resistant Staphylococcus aureus infection, unspecified site: Principal | ICD-10-CM

## 2020-04-28 DIAGNOSIS — E559 Vitamin D deficiency, unspecified: Principal | ICD-10-CM

## 2020-04-28 DIAGNOSIS — Z882 Allergy status to sulfonamides status: Principal | ICD-10-CM

## 2020-04-28 DIAGNOSIS — B004 Herpesviral encephalitis: Principal | ICD-10-CM

## 2020-04-28 DIAGNOSIS — R350 Frequency of micturition: Principal | ICD-10-CM

## 2020-04-28 DIAGNOSIS — D509 Iron deficiency anemia, unspecified: Principal | ICD-10-CM

## 2020-04-28 DIAGNOSIS — L97422 Non-pressure chronic ulcer of left heel and midfoot with fat layer exposed: Principal | ICD-10-CM

## 2020-04-28 DIAGNOSIS — Z452 Encounter for adjustment and management of vascular access device: Principal | ICD-10-CM

## 2020-04-28 DIAGNOSIS — B964 Proteus (mirabilis) (morganii) as the cause of diseases classified elsewhere: Principal | ICD-10-CM

## 2020-04-28 DIAGNOSIS — E871 Hypo-osmolality and hyponatremia: Principal | ICD-10-CM

## 2020-04-28 DIAGNOSIS — L97521 Non-pressure chronic ulcer of other part of left foot limited to breakdown of skin: Principal | ICD-10-CM

## 2020-04-28 DIAGNOSIS — M0579 Rheumatoid arthritis with rheumatoid factor of multiple sites without organ or systems involvement: Principal | ICD-10-CM

## 2020-04-28 DIAGNOSIS — Z8616 History of COVID-19: Principal | ICD-10-CM

## 2020-04-28 DIAGNOSIS — E785 Hyperlipidemia, unspecified: Principal | ICD-10-CM

## 2020-04-28 DIAGNOSIS — L97513 Non-pressure chronic ulcer of other part of right foot with necrosis of muscle: Principal | ICD-10-CM

## 2020-04-28 DIAGNOSIS — I82409 Acute embolism and thrombosis of unspecified deep veins of unspecified lower extremity: Secondary | ICD-10-CM

## 2020-04-28 DIAGNOSIS — I130 Hypertensive heart and chronic kidney disease with heart failure and stage 1 through stage 4 chronic kidney disease, or unspecified chronic kidney disease: Secondary | ICD-10-CM | POA: Diagnosis not present

## 2020-04-28 DIAGNOSIS — E1122 Type 2 diabetes mellitus with diabetic chronic kidney disease: Secondary | ICD-10-CM | POA: Insufficient documentation

## 2020-04-28 DIAGNOSIS — N183 Chronic kidney disease, stage 3 unspecified: Secondary | ICD-10-CM | POA: Insufficient documentation

## 2020-04-28 DIAGNOSIS — Z79899 Other long term (current) drug therapy: Secondary | ICD-10-CM | POA: Insufficient documentation

## 2020-04-28 DIAGNOSIS — Z7901 Long term (current) use of anticoagulants: Secondary | ICD-10-CM | POA: Diagnosis not present

## 2020-04-28 DIAGNOSIS — Z7984 Long term (current) use of oral hypoglycemic drugs: Secondary | ICD-10-CM | POA: Diagnosis not present

## 2020-04-28 DIAGNOSIS — M79672 Pain in left foot: Secondary | ICD-10-CM | POA: Diagnosis not present

## 2020-04-28 LAB — CBC WITH DIFFERENTIAL/PLATELET
Abs Immature Granulocytes: 0.08 10*3/uL — ABNORMAL HIGH (ref 0.00–0.07)
Basophils Absolute: 0.1 10*3/uL (ref 0.0–0.1)
Basophils Relative: 1 %
Eosinophils Absolute: 0 10*3/uL (ref 0.0–0.5)
Eosinophils Relative: 0 %
HCT: 36.6 % (ref 36.0–46.0)
Hemoglobin: 10.4 g/dL — ABNORMAL LOW (ref 12.0–15.0)
Immature Granulocytes: 1 %
Lymphocytes Relative: 18 %
Lymphs Abs: 2 10*3/uL (ref 0.7–4.0)
MCH: 19.7 pg — ABNORMAL LOW (ref 26.0–34.0)
MCHC: 28.4 g/dL — ABNORMAL LOW (ref 30.0–36.0)
MCV: 69.4 fL — ABNORMAL LOW (ref 80.0–100.0)
Monocytes Absolute: 0.8 10*3/uL (ref 0.1–1.0)
Monocytes Relative: 7 %
Neutro Abs: 7.7 10*3/uL (ref 1.7–7.7)
Neutrophils Relative %: 73 %
Platelets: 224 10*3/uL (ref 150–400)
RBC: 5.27 MIL/uL — ABNORMAL HIGH (ref 3.87–5.11)
RDW: 21.3 % — ABNORMAL HIGH (ref 11.5–15.5)
WBC: 10.6 10*3/uL — ABNORMAL HIGH (ref 4.0–10.5)
nRBC: 0 % (ref 0.0–0.2)

## 2020-04-28 LAB — COMPREHENSIVE METABOLIC PANEL
ALT: 31 U/L (ref 0–44)
AST: 32 U/L (ref 15–41)
Albumin: 3.6 g/dL (ref 3.5–5.0)
Alkaline Phosphatase: 70 U/L (ref 38–126)
Anion gap: 15 (ref 5–15)
BUN: 15 mg/dL (ref 6–20)
CO2: 24 mmol/L (ref 22–32)
Calcium: 9.8 mg/dL (ref 8.9–10.3)
Chloride: 91 mmol/L — ABNORMAL LOW (ref 98–111)
Creatinine, Ser: 0.78 mg/dL (ref 0.44–1.00)
GFR, Estimated: >60 mL/min (ref >60)
Glucose, Bld: 376 mg/dL — ABNORMAL HIGH (ref 70–99)
Potassium: 4.4 mmol/L (ref 3.5–5.1)
Sodium: 130 mmol/L — ABNORMAL LOW (ref 135–145)
Total Bilirubin: 0.7 mg/dL (ref 0.3–1.2)
Total Protein: 8.5 g/dL — ABNORMAL HIGH (ref 6.5–8.1)

## 2020-04-28 LAB — LACTIC ACID, PLASMA
Lactic Acid, Venous: 2 mmol/L (ref 0.5–1.9)
Lactic Acid, Venous: 1.4 mmol/L (ref 0.5–1.9)

## 2020-04-28 MED ORDER — DROPERIDOL 2.5 MG/ML IJ SOLN
2.5000 mg | Freq: Once | INTRAMUSCULAR | Status: AC
  Administered 2020-04-28: 2.5 mg via INTRAVENOUS
  Filled 2020-04-28: qty 2

## 2020-04-28 MED ORDER — MORPHINE SULFATE (PF) 4 MG/ML IV SOLN
4.0000 mg | Freq: Once | INTRAVENOUS | Status: AC
  Administered 2020-04-28: 4 mg via INTRAVENOUS
  Filled 2020-04-28: qty 1

## 2020-04-28 MED ORDER — MORPHINE SULFATE (PF) 4 MG/ML IV SOLN
6.0000 mg | Freq: Once | INTRAVENOUS | Status: AC
  Administered 2020-04-28: 6 mg via INTRAVENOUS
  Filled 2020-04-28: qty 2

## 2020-04-28 MED ORDER — PROCHLORPERAZINE EDISYLATE 10 MG/2ML IJ SOLN
10.0000 mg | Freq: Once | INTRAMUSCULAR | Status: AC
  Administered 2020-04-28: 10 mg via INTRAVENOUS
  Filled 2020-04-28: qty 2

## 2020-04-28 NOTE — ED Notes (Addendum)
Pt repeatedly calling out while in triage middle. Pt repeatedly yelling out "please help me, please help me, you gotta help me!" Pt stopping staff that walks by, including EMS that walks by and asking for pain medication. Pt ambulated by herself to the bathroom then pulled red call bell for assistance back to blue recliner in triage middle.

## 2020-04-28 NOTE — ED Triage Notes (Addendum)
Pt states severe right shoulder pain. Pt recently was discharged from [facility] for sepsis. Pt is taking home antibiotics through a PICC line in right arm. PICC line dresssing is loose, line does not have blood return and does not flush. Pt states is nauseated. Pt with history opf RA. Pt requesting to be tested for sepsis.

## 2020-04-28 NOTE — ED Notes (Signed)
Patient transported to Ultrasound 

## 2020-04-28 NOTE — ED Notes (Signed)
pateien has blood on dressing due to tried to get up and walk again.  She continues to [employer]' over and over.  Dressing replaced and ace placed back onlooseley as that was dressing on arrival.

## 2020-04-28 NOTE — ED Notes (Signed)
Iv team here 

## 2020-04-28 NOTE — ED Notes (Signed)
Patient is moaning in pain.  Says right shoulder pain and lef tfoot pain.  Being treated for infected foot and has picc line right shoudler area and she says the line is not working.

## 2020-04-28 NOTE — ED Notes (Signed)
Was up and walked to bathroom without dressing on foot and got blood trail to bathroom and puddle in bathroom.  Taken back to room and instructed not to get up again.

## 2020-04-28 NOTE — Discharge Instructions (Signed)
Your PICC line does flush, but but did not draw back.  Continue to use this for your antibiotics at home.   If your PICC line stops working and does not flush, please return to the ED.

## 2020-04-28 NOTE — ED Notes (Signed)
Date and time results received: 04/28/20  (use smartphrase ".now" to insert current time)  Test: lactic Critical Value: 2  Name of Provider Notified: Dr. Dolores Frame  Orders Received? Or Actions Taken?: acknowledged

## 2020-04-28 NOTE — ED Provider Notes (Signed)
[facility] Emergency Department Provider Note ____________________________________________   Event Date/Time   First MD Initiated Contact with Patient 04/28/20 0732     (approximate)  I have reviewed the triage vital signs and the nursing notes.  HISTORY  Chief Complaint Shoulder Pain   HPI [name] [name] is a 59 y.o. femalewho presents to the ED for evaluation of shoulder pain and nausea.   Chart review indicates hx obesity, RA, DM, recent sepsis admission at [facility].  20 fibrosis and previous Covid on home O2. 12/22-1/1 admission for sepsis attributed to left foot osteomyelitis.  Right arm PICC line was placed and she was discharged home with IV cefepime and Vanco.  Patient is in the ED with acute on chronic right shoulder and left foot pain.  Patient denies trauma, falls or injuries to these areas.  She reports last received antibiotics via her PICC line earlier this morning via home health.  She reports she is due for her next round later this morning around lunchtime.  Currently reporting 10/10 pain to her right shoulder and left foot.  She reports this feels like her RA, she reports having her chronic pain medications at home including Percocet .   Denies fevers, purulent drainage from her wounds.  Syncope, chest pain, productive cough, emesis, dysuria or stool changes.  She was lives at home with her daughter who helps with some of her dressing changes and infusions.  Past Medical History:  Diagnosis Date  . Abdominal wall hernia 01/29/2013  . Anxiety   . Arthritis    Rheumatoid  . C. difficile colitis   . Chronic diastolic heart failure (HCC)   . COVID-19 03/23/2019   Diagnosed at [facility] (send-out) on 03/23/2019  . Depression   . Diabetes mellitus    states no meds or diet restrictions  at present  . Diastolic CHF (HCC)   . Esophagitis   . Fluid retention   . GERD (gastroesophageal reflux disease)   . Hiatal hernia   .  Hypertension   . Hypokalemia due to loss of potassium 10/21/2015   Overview:  Associated with 3 weeks of diarrhea  And QT prolongation.  . Hypothyroidism   . IBS (irritable bowel syndrome)   . Moderate episode of recurrent major depressive disorder (HCC) 06/03/2004  . Morbid obesity (HCC)   . MRSA (methicillin resistant Staphylococcus aureus) infection 11/2017   left inner thigh abcess  . Neurogenic bladder    has pacemaker  . Neuropathy   . Obesity   . Panic attacks   . Pneumonia due to COVID-19 virus   . Rheumatoid arthritis (HCC)   . Sleep apnea    STATES SEVERE, CANT TOLERATE MASK- LAST STUDY YEARS AGO    Patient Active Problem List   Diagnosis Date Noted  . 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 Chronic respiratory failure with hypoxia (HCC) 02/07/2020  . Anemia 02/07/2020  . Lower extremity cellulitis 01/10/2020  . Bacteremia   . Paroxysmal A-fib (HCC)   . Right arm weakness   . Ileus (HCC)   . Splenic infarct   . Acute metabolic encephalopathy 07/08/2019  . Hyperglycemia due to type 2 diabetes mellitus (HCC) 07/08/2019  . Morbid obesity with BMI of 50.0-59.9, adult (HCC) 07/08/2019  . Severe sepsis with septic shock (HCC) 07/08/2019  . Septic shock (HCC) 07/08/2019  . History of severe acute respiratory syndrome coronavirus 2 (SARS-CoV-2) disease 05/24/2019  . Acute respiratory disease due to COVID-19 virus 04/06/2019  .  Type 2 diabetes mellitus without complication, without long-term current use of insulin (HCC) 04/06/2019  . CKD (chronic kidney disease), stage III (HCC) 04/06/2019  . HLD (hyperlipidemia) 04/06/2019  . COVID-19 03/27/2019  . Pneumonia due to COVID-19 virus 03/27/2019  . Abdominal pain 02/12/2019  . AKI (acute kidney injury) (HCC) 02/12/2019  . Cellulitis, abdominal wall 02/04/2019  . Elevated C-reactive protein (CRP) 12/15/2018  . Elevated sed rate 12/15/2018  . Pharmacologic therapy 11/06/2018  . Disorder of skeletal system  11/06/2018  . Problems influencing health status 11/06/2018  . Decubitus ulcer of heel, bilateral 02/21/2018  . Diabetic ulcer of toe of right foot associated with type 2 diabetes mellitus (HCC) 02/14/2018  . Ulcer of left heel and midfoot with fat layer exposed (HCC) 02/14/2018  . Sepsis due to methicillin resistant Staphylococcus aureus (MRSA) without acute organ dysfunction (HCC) 11/17/2017  . MRSA (methicillin resistant Staphylococcus aureus) infection 11/17/2017  . Cellulitis 11/15/2017  . Perineal abscess 11/15/2017  . Subacute vulvitis 11/01/2017  . Chronic cystitis 03/01/2017  . Chronic pain syndrome 03/31/2016  . Insomnia secondary to chronic pain 03/31/2016  . Chronic upper back pain 12/25/2015  . Chronic hand pain (Bilateral) (L>R) 12/25/2015  . Rheumatoid arthritis (HCC) 12/25/2015  . Osteoarthritis, multiple sites 12/24/2015  . Chronic foot pain (Primary Area of Pain) (Bilateral) (L>R) 12/24/2015  . Chronic elbow pain (Third area of Pain) (Bilateral) (L>R) 12/24/2015  . Chronic shoulder pain (Bilateral) (L>R) 12/24/2015  . Chronic neck pain (Bilateral) (R>L) 12/24/2015  . 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[employer]) 12/23/2015  . Chronic knee pain (Bilateral) (R>L) 12/23/2015  . Long term current use of opiate analgesic 12/23/2015  . Long term prescription opiate use 12/23/2015  . Opiate use (30 MME/Day) 12/23/2015  . Neurogenic pain 12/23/2015  . Neuropathic pain 12/23/2015  . Diabetic peripheral neuropathy (HCC) 12/23/2015  . Encounter for therapeutic drug level monitoring 12/23/2015  . Encounter for pain management planning 12/23/2015  . GERD (gastroesophageal reflux disease) 11/25/2015  . Neuropathy 11/25/2015  . Hypokalemia due to loss of potassium 10/21/2015  . Hypomagnesemia 10/21/2015  . QT prolongation 10/21/2015  . 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 Chronic wrist pain (Secondary area of Pain) (Bilateral)  (L>R) 03/19/2015  . 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 Female genuine stress incontinence 02/14/2015  . Urge incontinence of urine 02/14/2015  . OSA (obstructive sleep apnea) 07/03/2014  . Chronic diastolic CHF (congestive heart failure) (HCC) 07/03/2014  . Anxiety 01/03/2014  . Diabetic ulcer of heel (HCC) 01/03/2014  . COPD (chronic obstructive pulmonary disease) (HCC) 01/03/2014  . Bipolar disorder, unspecified (HCC) 01/03/2014  . Diastolic dysfunction 01/03/2014  . Combined fat and carbohydrate induced hyperlipemia 01/03/2014  . Shortness of breath 01/03/2014  . Acute on chronic congestive heart failure (HCC) 01/03/2014  . Incomplete bladder emptying 11/02/2012  . Bladder retention 10/10/2012  . Detrusor muscle hypertonia 10/04/2012  . Obstruction of urinary tract 10/04/2012  . FOM (frequency of micturition) 10/04/2012  . Mixed incontinence 10/04/2012  . Vitamin D insufficiency 04/15/2012  . Borderline personality disorder (HCC) 01/06/2012  . Anxiety and depression 01/06/2012  . History of laparoscopic adjustable gastric banding, 03/20/2007.  Removed 09/19/2011. 08/04/2011  . Nausea & vomiting 08/04/2011  . Hypothyroidism 06/28/2010  . Rheumatoid arthritis involving multiple sites with positive rheumatoid factor (HCC) 06/28/2010  . Obesity 03/29/2005  . Essential (primary) hypertension 01/27/2005  . Major depressive disorder, recurrent episode, moderate (HCC) 06/03/2004    Past Surgical History:  Procedure Laterality  Date  . ABDOMINAL HYSTERECTOMY    . CHOLECYSTECTOMY    . DG GREAT TOE RIGHT FOOT  02/23/2018  . EYE SURGERY     bilateral cataract extraction with IOL  . HERNIA REPAIR     ventral hernia with strangulation  . IRRIGATION AND DEBRIDEMENT FOOT Left 01/11/2020   Procedure: IRRIGATION AND DEBRIDEMENT FOOT;  Surgeon: Gwyneth Revels, DPM;  Location: [facility] ORS;  Service: Podiatry;  Laterality: Left;  . LAPAROSCOPIC GASTRIC BANDING  03/20/07  . TEE WITHOUT CARDIOVERSION  N/A 07/16/2019   Procedure: TRANSESOPHAGEAL ECHOCARDIOGRAM (TEE);  Surgeon: Debbe Odea, MD;  Location: [facility] ORS;  Service: Cardiovascular;  Laterality: N/A;  . TONSILLECTOMY    . TUBAL LIGATION      Prior to Admission medications   Medication Sig Start Date End Date Taking? Authorizing Provider  vancomycin IVPB Inject 500 mg into the vein. Unknown dose.  Given at home via picc line   Yes [provider]  acetaminophen (TYLENOL) 325 MG tablet Take 2 tablets (650 mg total) by mouth every 6 (six) hours as needed for mild pain or moderate pain (or Fever >/= 101). 01/15/20   Sheikh, Kateri Mc Latif, DO  ALPRAZolam Prudy Feeler) 0.5 MG tablet Take 1 tablet (0.5 mg total) by mouth 2 (two) times daily as needed for anxiety. 07/17/19   Alford Highland, MD  ARIPiprazole (ABILIFY) 5 MG tablet Take 1 tablet (5 mg total) by mouth daily. 03/13/20   Clapacs, Jackquline Denmark, MD  ascorbic acid (VITAMIN C) 500 MG tablet Take 1 tablet (500 mg total) by mouth daily. 05/05/19   Rolly Salter, MD  atorvastatin (LIPITOR) 80 MG tablet Take 80 mg by mouth daily.  12/05/19 12/04/20  [provider]  budesonide (PULMICORT) 0.5 MG/2ML nebulizer solution Take 2 mLs (0.5 mg total) by nebulization 2 (two) times daily. 06/04/19 06/03/20  Salena Saner, MD  calcium citrate-vitamin D 500-500 MG-UNIT chewable tablet Chew 0.5 tablets by mouth daily. 01/16/20   Marguerita Merles Latif, DO  Cholecalciferol (VITAMIN D) 125 MCG (5000 UT) CAPS Take 1 capsule by mouth daily. 11/29/19   [provider]  famotidine (PEPCID) 20 MG tablet Take 1 tablet (20 mg total) by mouth daily. 05/05/19   Rolly Salter, MD  FLUoxetine (PROZAC) 20 MG capsule TAKE (1) CAPSULE BY MOUTH ONCE DAILY. Patient taking differently: Take 20 mg by mouth daily.  12/06/19   Clapacs, Jackquline Denmark, MD  folic acid (FOLVITE) 1 MG tablet Take 1 mg by mouth daily.     [provider]  furosemide (LASIX) 20 MG tablet TAKE (1) TABLET BY MOUTH DAILY AS NEEDED.  09/05/19   Parrett, Tammy S, NP  insulin lispro (HUMALOG) 100 UNIT/ML injection Inject 0.08 mLs (8 Units total) into the skin 3 (three) times daily before meals. 07/17/19   Wieting, Richard, MD  ipratropium-albuterol (DUONEB) 0.5-2.5 (3) MG/3ML SOLN Take 3 mLs by nebulization 3 (three) times daily. 05/04/19   Rolly Salter, MD  levothyroxine (SYNTHROID, LEVOTHROID) 88 MCG tablet Take 88 mcg by mouth daily before breakfast.    [provider]  metFORMIN (GLUCOPHAGE-XR) 500 MG 24 hr tablet Take 500 mg by mouth daily. 12/31/19   [provider]  methotrexate 2.5 MG tablet Take 10 mg by mouth once a week.  12/31/19   [provider]  mupirocin ointment (BACTROBAN) 2 % Apply 1 application topically 2 (two) times daily. Intranasal - to affected area. 11/28/19   Tommie Sams, DO  oxyCODONE-acetaminophen (PERCOCET) 5-325 MG  tablet Take 1 tablet by mouth every 6 (six) hours as needed for severe pain. Must last 30 days. 04/14/20 05/14/20  Edward Jolly, MD  polyethylene glycol (MIRALAX / GLYCOLAX) 17 g packet Take 17 g by mouth daily as needed for mild constipation. 05/04/19   Rolly Salter, MD  potassium chloride SA (KLOR-CON) 20 MEQ tablet TAKE 1 TABLET BY MOUTH ONCE DAILY. Patient taking differently: 10 mEq.  09/05/19   Parrett, Virgel Bouquet, NP  pramipexole (MIRAPEX) 0.125 MG tablet Take 0.25 mg by mouth at bedtime.     [provider]  predniSONE (DELTASONE) 10 MG tablet Take 10 mg by mouth daily. 12/31/19   [provider]  pregabalin (LYRICA) 150 MG capsule Take 1 capsule (150 mg total) by mouth 3 (three) times daily. 11/19/19 05/17/20  Delano Metz, MD  QUEtiapine (SEROQUEL) 300 MG tablet TAKE ONE TABLET BY MOUTH AT BEDTIME. 01/03/20   Clapacs, Jackquline Denmark, MD  solifenacin (VESICARE) 5 MG tablet Take 5 mg by mouth daily. 12/31/19   [provider]  TRADJENTA 5 MG TABS tablet Take 5 mg by mouth daily. 12/31/19   [provider]  zinc sulfate 220 (50 Zn)  MG capsule Take 220 mg by mouth at bedtime.  12/31/19   [provider]  zolpidem (AMBIEN) 5 MG tablet TAKE ONE TABLET BY MOUTH AT BEDTIME. 11/20/19   Clapacs, Jackquline Denmark, MD  apixaban (ELIQUIS) 5 MG TABS tablet Two tabs po twcie a day for 6 days then one tablet twice a day after that 07/17/19 11/28/19  Alford Highland, MD    Allergies Cephalexin, Codeine, Doxycycline, Propoxyphene, Sulfa antibiotics, Lovenox [enoxaparin sodium], Hydrocodone, and Meropenem  Family History  Problem Relation Age of Onset  . Heart failure Father   . Bipolar disorder Father   . Alcohol abuse Father   . Anxiety disorder Father   . Depression Father   . Heart disease Brother   . Heart attack Brother 48       MI s/p stents placed  . Anxiety disorder Sister   . Depression Sister   . Anxiety disorder Sister   . Depression Sister   . Bipolar disorder Sister   . Alcohol abuse Sister   . Drug abuse Sister   . Heart attack Brother     Social History Social History   Tobacco Use  . Smoking status: Former Smoker    Packs/day: 2.00    Years: 27.00    Pack years: 54.00    Types: Cigarettes    Quit date: 07/30/1999    Years since quitting: 20.7  . Smokeless tobacco: Never Used  Vaping Use  . Vaping Use: Never used  Substance Use Topics  . Alcohol use: No  . Drug use: No    Review of Systems  Constitutional: No fever/chills Eyes: No visual changes. ENT: No sore throat. Cardiovascular: Denies chest pain. Respiratory: Denies shortness of breath. Gastrointestinal: No abdominal pain.  No nausea, no vomiting.  No diarrhea.  No constipation. Genitourinary: Negative for dysuria. Musculoskeletal: Acute on chronic right shoulder and left foot pain. Skin: Negative for rash. Neurological: Negative for headaches, focal weakness or numbness.  ____________________________________________   PHYSICAL EXAM:  VITAL SIGNS: Vitals:   04/28/20 0535 04/28/20 0803  BP: (!) 142/74 106/88  Pulse: 89 (!) 119   Resp: 18 16  Temp:    SpO2: 92% 98%     Constitutional: Alert and oriented.  Obese.  Uncomfortable-appearing.  Conversational in full sentences.  Frequently requesting  Dilaudid. Eyes: Conjunctivae are normal. PERRL. EOMI. Head: Atraumatic. Nose: No congestion/rhinnorhea. Mouth/Throat: Mucous membranes are moist.  Oropharynx non-erythematous. Neck: No stridor. No cervical spine tenderness to palpation. Cardiovascular: Normal rate, regular rhythm. Grossly normal heart sounds.  Good peripheral circulation. Respiratory: Normal respiratory effort.  No retractions. Lungs CTAB. Gastrointestinal: Soft , nondistended, nontender to palpation. No CVA tenderness. Musculoskeletal: Left foot wrapped in gauze, small amounts of serosanguineous discharge on this.  No purulence from the wound.  Should below, without induration, erythema or purulence.  Clean wound margins. PICC line to the right upper extremity in place with a bulky dressing.  No erythema, induration or purulence at the site of insertion.  With a sterile flush, I am able to flush the PICC line with little bit of pressure, but unable to draw back.  Does not cause worsening pain. No signs of trauma to her shoulder and her active and passive ROM intact.  Right upper extremities distally neurovascularly intact. Neurologic:  Normal speech and language. No gross focal neurologic deficits are appreciated.  Skin:  Skin is warm, dry and intact. No rash noted. Psychiatric: Mood and affect are normal. Speech and behavior are normal.     ____________________________________________   LABS (all labs ordered are listed, but only abnormal results are displayed)  Labs Reviewed  LACTIC ACID, PLASMA - Abnormal; Notable for the following components:      Result Value   Lactic Acid, Venous 2.0 (*)    All other components within normal limits  COMPREHENSIVE METABOLIC PANEL - Abnormal; Notable for the following components:   Sodium 130 (*)    Chloride  91 (*)    Glucose, Bld 376 (*)    Total Protein 8.5 (*)    All other components within normal limits  CBC WITH DIFFERENTIAL/PLATELET - Abnormal; Notable for the following components:   WBC 10.6 (*)    RBC 5.27 (*)    Hemoglobin 10.4 (*)    MCV 69.4 (*)    MCH 19.7 (*)    MCHC 28.4 (*)    RDW 21.3 (*)    Abs Immature Granulocytes 0.08 (*)    All other components within normal limits  LACTIC ACID, PLASMA   ____________________________________________  12 Lead EKG  Sinus rhythm, rate of 87 bpm.  Normal axis and intervals.  No evidence of acute ischemia. ____________________________________________  RADIOLOGY  ED MD interpretation: Plain film of the right shoulder reviewed by me without evidence of acute fracture or dislocation.  Official radiology report(s): DG Shoulder Right  Result Date: 04/28/2020 CLINICAL DATA:  Shoulder pain EXAM: RIGHT SHOULDER - 2+ VIEW COMPARISON:  12/24/2015 FINDINGS: There is glenohumeral osteoarthritis. There is no acute displaced fracture or dislocation. A partially visualized right-sided PICC line is noted. The osseous mineralization is within normal limits. IMPRESSION: Glenohumeral osteoarthritis. No acute displaced fracture or dislocation. Electronically Signed   By: Katherine Mantle M.D.   On: 04/28/2020 03:38   US Venous Img Upper Uni Right(DVT)  Result Date: 04/28/2020 CLINICAL DATA:  Pain with right upper extremity PICC for antibiotics EXAM: RIGHT UPPER EXTREMITY VENOUS DOPPLER ULTRASOUND TECHNIQUE: Gray-scale sonography with graded compression, as well as color Doppler and duplex ultrasound were performed to evaluate the upper extremity deep venous system from the level of the subclavian vein and including the jugular, axillary, basilic, radial, ulnar and upper cephalic vein. Spectral Doppler was utilized to evaluate flow at rest and with distal augmentation maneuvers. COMPARISON:  None. FINDINGS: Contralateral Internal Jugular and subclavian  Vein: Respiratory  phasicity is normal and symmetric with the symptomatic side. No evidence of thrombus. Internal Jugular Vein: No evidence of thrombus. Normal compressibility, respiratory phasicity and response to augmentation. Subclavian Vein: No evidence of thrombus. Normal respiratory phasicity and response to augmentation. Axillary Vein: No evidence of thrombus. Normal compressibility, respiratory phasicity and response to augmentation. Cephalic Vein: Portion of the mid cephalic vein is poorly visualized due to the presence of catheter bandaging material. Otherwise no visible evidence of thrombus. Normal compressibility, respiratory phasicity and response to augmentation. Basilic Vein: No evidence of thrombus. Normal compressibility, respiratory phasicity and response to augmentation. Brachial Veins: No evidence of thrombus. Normal compressibility, respiratory phasicity and response to augmentation. Radial Veins: No evidence of thrombus.  Normal compressibility. Ulnar Veins: No evidence of thrombus.  Normal compressibility. Venous Reflux:  None visualized. Other Findings: Cephalic approach peripherally inserted central catheter. IMPRESSION: Right cephalic approach catheter, partially obscures portion of the cephalic vein at the insertion site. No adherent clot is evident within the visible portions. No evidence of DVT within the right upper extremity. Electronically Signed   By: Kreg Shropshire M.D.   On: 04/28/2020 05:17   [name] EKG SITE RITE  Result Date: 04/28/2020 If Site Rite image not attached, placement could not be confirmed due to current cardiac rhythm.   ____________________________________________   PROCEDURES and INTERVENTIONS  Procedure(s) performed (including Critical Care):  Procedures  Medications  morphine 4 MG/ML injection 4 mg (4 mg Intravenous Given 04/28/20 0410)  prochlorperazine (COMPAZINE) injection 10 mg (10 mg Intravenous Given 04/28/20 0410)  droperidol (INAPSINE) 2.5 MG/ML  injection 2.5 mg (2.5 mg Intravenous Given 04/28/20 0755)  morphine 4 MG/ML injection 6 mg (6 mg Intravenous Given 04/28/20 0751)    ____________________________________________   MDM / ED COURSE   60 year old woman sent to the ED with acute on chronic pain without evidence of acute pathology.  Recently discharged from [facility] with PICC line in place and home cefepime/vancomycin for sepsis attributed to foot osteomyelitis.  Her wound, pictured above, looks well with clean margins to the wound bed and no evidence of additional infection.  Her PICC line is able to flush, but does not draw back.  Was working with her to try to get her PICC line exchanged, but she ultimately demands to leave the ED and requests discharge.  Considering her PICC line still flushes, she is still up to get her home antibiotics, I see no evidence of acute pathology to preclude further outpatient management.  Return precautions for the ED were discussed.  Clinical Course as of 04/28/20 1004  Mon Apr 28, 2020  1610 Patient ambulates out of the room independently without distress, moaning and requesting medications and help.  I escort her back into her room and she is able to get to the stretcher independently. [DS]  [phone] Patient ambulates independently to the restroom. [DS]  [phone] Patient was brought back to her room, I cleaned her foot with iodine solution and applied sterile dressing. [DS]  0932 PICC team RN messages me and indicates they are unable to troubleshoot or exchange PICC line was placed at other institutions, per protocol.  We will therefore have them place a PICC line on the contralateral side and remove the nonfunctioning PICC that is present on arrival. [DS]  0940 Patient again ambulates out of her room, demanding morphine or other opiates, tracking blood over the floor. [DS]  1002 Patient again ambulates out of her room and demands discharge.  We discussed PICC team on  the way and my recommendation to  exchange her line.  She reports that she has to leave and that she will be going home.  We discussed that her PICC line is flushing, but not drawing, so she should be able to get her antibiotics at home.  We discussed return precautions for the ED to suggest her PICC line is longer functional to receive antibiotics. [DS]    Clinical Course User Index [DS] Delton Prairie, MD    ____________________________________________   FINAL CLINICAL IMPRESSION(S) / ED DIAGNOSES  Final diagnoses:  DVT (deep venous thrombosis) (HCC)  Encounter for assessment of peripherally inserted central catheter (PICC)     ED Discharge Orders    None       Clayborn Milnes   Note:  This document was prepared using Dragon voice recognition software and may include unintentional dictation errors.   Delton Prairie, MD 04/28/20 1006

## 2020-04-29 ENCOUNTER — Telehealth: Payer: Self-pay | Admitting: *Deleted

## 2020-04-29 DIAGNOSIS — N179 Acute kidney failure, unspecified: Principal | ICD-10-CM

## 2020-04-29 DIAGNOSIS — E1165 Type 2 diabetes mellitus with hyperglycemia: Principal | ICD-10-CM

## 2020-04-29 DIAGNOSIS — I878 Other specified disorders of veins: Principal | ICD-10-CM

## 2020-04-29 DIAGNOSIS — A4189 Other specified sepsis: Principal | ICD-10-CM

## 2020-04-29 DIAGNOSIS — Z20822 Contact with and (suspected) exposure to covid-19: Principal | ICD-10-CM

## 2020-04-29 DIAGNOSIS — I5032 Chronic diastolic (congestive) heart failure: Principal | ICD-10-CM

## 2020-04-29 DIAGNOSIS — G2581 Restless legs syndrome: Principal | ICD-10-CM

## 2020-04-29 DIAGNOSIS — M869 Osteomyelitis, unspecified: Principal | ICD-10-CM

## 2020-04-29 DIAGNOSIS — E039 Hypothyroidism, unspecified: Principal | ICD-10-CM

## 2020-04-29 DIAGNOSIS — L97509 Non-pressure chronic ulcer of other part of unspecified foot with unspecified severity: Principal | ICD-10-CM

## 2020-04-29 DIAGNOSIS — Z9119 Patient's noncompliance with other medical treatment and regimen: Principal | ICD-10-CM

## 2020-04-29 DIAGNOSIS — K219 Gastro-esophageal reflux disease without esophagitis: Principal | ICD-10-CM

## 2020-04-29 DIAGNOSIS — K439 Ventral hernia without obstruction or gangrene: Principal | ICD-10-CM

## 2020-04-29 DIAGNOSIS — E11621 Type 2 diabetes mellitus with foot ulcer: Principal | ICD-10-CM

## 2020-04-29 DIAGNOSIS — N139 Obstructive and reflux uropathy, unspecified: Principal | ICD-10-CM

## 2020-04-29 DIAGNOSIS — F419 Anxiety disorder, unspecified: Principal | ICD-10-CM

## 2020-04-29 DIAGNOSIS — B004 Herpesviral encephalitis: Principal | ICD-10-CM

## 2020-04-29 DIAGNOSIS — I2510 Atherosclerotic heart disease of native coronary artery without angina pectoris: Principal | ICD-10-CM

## 2020-04-29 DIAGNOSIS — G894 Chronic pain syndrome: Principal | ICD-10-CM

## 2020-04-29 DIAGNOSIS — L89629 Pressure ulcer of left heel, unspecified stage: Principal | ICD-10-CM

## 2020-04-29 DIAGNOSIS — I110 Hypertensive heart disease with heart failure: Principal | ICD-10-CM

## 2020-04-29 DIAGNOSIS — L03115 Cellulitis of right lower limb: Principal | ICD-10-CM

## 2020-04-29 DIAGNOSIS — M25511 Pain in right shoulder: Principal | ICD-10-CM

## 2020-04-29 DIAGNOSIS — G928 Other toxic encephalopathy: Principal | ICD-10-CM

## 2020-04-29 DIAGNOSIS — L03116 Cellulitis of left lower limb: Principal | ICD-10-CM

## 2020-04-29 DIAGNOSIS — R350 Frequency of micturition: Principal | ICD-10-CM

## 2020-04-29 DIAGNOSIS — E1169 Type 2 diabetes mellitus with other specified complication: Principal | ICD-10-CM

## 2020-04-29 DIAGNOSIS — E785 Hyperlipidemia, unspecified: Principal | ICD-10-CM

## 2020-04-29 DIAGNOSIS — Z87891 Personal history of nicotine dependence: Principal | ICD-10-CM

## 2020-04-29 DIAGNOSIS — E871 Hypo-osmolality and hyponatremia: Principal | ICD-10-CM

## 2020-04-29 DIAGNOSIS — D509 Iron deficiency anemia, unspecified: Principal | ICD-10-CM

## 2020-04-29 DIAGNOSIS — Z794 Long term (current) use of insulin: Principal | ICD-10-CM

## 2020-04-29 DIAGNOSIS — Z882 Allergy status to sulfonamides status: Principal | ICD-10-CM

## 2020-04-29 DIAGNOSIS — M069 Rheumatoid arthritis, unspecified: Principal | ICD-10-CM

## 2020-04-29 DIAGNOSIS — E876 Hypokalemia: Principal | ICD-10-CM

## 2020-04-29 DIAGNOSIS — R21 Rash and other nonspecific skin eruption: Principal | ICD-10-CM

## 2020-04-29 DIAGNOSIS — G4700 Insomnia, unspecified: Principal | ICD-10-CM

## 2020-04-29 DIAGNOSIS — E662 Morbid (severe) obesity with alveolar hypoventilation: Principal | ICD-10-CM

## 2020-04-29 DIAGNOSIS — E1142 Type 2 diabetes mellitus with diabetic polyneuropathy: Principal | ICD-10-CM

## 2020-04-29 DIAGNOSIS — U099 Post covid-19 condition, unspecified: Principal | ICD-10-CM

## 2020-04-29 DIAGNOSIS — F319 Bipolar disorder, unspecified: Principal | ICD-10-CM

## 2020-04-29 DIAGNOSIS — M160 Bilateral primary osteoarthritis of hip: Principal | ICD-10-CM

## 2020-04-29 DIAGNOSIS — N3946 Mixed incontinence: Principal | ICD-10-CM

## 2020-04-29 DIAGNOSIS — J8410 Pulmonary fibrosis, unspecified: Principal | ICD-10-CM

## 2020-04-29 DIAGNOSIS — Z6841 Body Mass Index (BMI) 40.0 and over, adult: Principal | ICD-10-CM

## 2020-04-29 DIAGNOSIS — K449 Diaphragmatic hernia without obstruction or gangrene: Principal | ICD-10-CM

## 2020-04-29 NOTE — Telephone Encounter (Signed)
Patient needs a refill of Buspar.  Please review.

## 2020-04-30 DIAGNOSIS — I878 Other specified disorders of veins: Principal | ICD-10-CM

## 2020-04-30 DIAGNOSIS — R21 Rash and other nonspecific skin eruption: Principal | ICD-10-CM

## 2020-04-30 DIAGNOSIS — M869 Osteomyelitis, unspecified: Principal | ICD-10-CM

## 2020-04-30 DIAGNOSIS — E1165 Type 2 diabetes mellitus with hyperglycemia: Principal | ICD-10-CM

## 2020-04-30 DIAGNOSIS — I5032 Chronic diastolic (congestive) heart failure: Principal | ICD-10-CM

## 2020-04-30 DIAGNOSIS — F419 Anxiety disorder, unspecified: Principal | ICD-10-CM

## 2020-04-30 DIAGNOSIS — M25511 Pain in right shoulder: Principal | ICD-10-CM

## 2020-04-30 DIAGNOSIS — Z4801 Encounter for change or removal of surgical wound dressing: Principal | ICD-10-CM

## 2020-04-30 DIAGNOSIS — F319 Bipolar disorder, unspecified: Principal | ICD-10-CM

## 2020-04-30 DIAGNOSIS — Z794 Long term (current) use of insulin: Principal | ICD-10-CM

## 2020-04-30 DIAGNOSIS — L03115 Cellulitis of right lower limb: Principal | ICD-10-CM

## 2020-04-30 DIAGNOSIS — Z452 Encounter for adjustment and management of vascular access device: Principal | ICD-10-CM

## 2020-04-30 DIAGNOSIS — N179 Acute kidney failure, unspecified: Principal | ICD-10-CM

## 2020-04-30 DIAGNOSIS — Z9119 Patient's noncompliance with other medical treatment and regimen: Principal | ICD-10-CM

## 2020-04-30 DIAGNOSIS — K439 Ventral hernia without obstruction or gangrene: Principal | ICD-10-CM

## 2020-04-30 DIAGNOSIS — M0579 Rheumatoid arthritis with rheumatoid factor of multiple sites without organ or systems involvement: Principal | ICD-10-CM

## 2020-04-30 DIAGNOSIS — E876 Hypokalemia: Principal | ICD-10-CM

## 2020-04-30 DIAGNOSIS — G928 Other toxic encephalopathy: Principal | ICD-10-CM

## 2020-04-30 DIAGNOSIS — K219 Gastro-esophageal reflux disease without esophagitis: Principal | ICD-10-CM

## 2020-04-30 DIAGNOSIS — R7881 Bacteremia: Principal | ICD-10-CM

## 2020-04-30 DIAGNOSIS — I110 Hypertensive heart disease with heart failure: Principal | ICD-10-CM

## 2020-04-30 DIAGNOSIS — L97521 Non-pressure chronic ulcer of other part of left foot limited to breakdown of skin: Principal | ICD-10-CM

## 2020-04-30 DIAGNOSIS — E039 Hypothyroidism, unspecified: Principal | ICD-10-CM

## 2020-04-30 DIAGNOSIS — J9611 Chronic respiratory failure with hypoxia: Principal | ICD-10-CM

## 2020-04-30 DIAGNOSIS — R350 Frequency of micturition: Principal | ICD-10-CM

## 2020-04-30 DIAGNOSIS — B964 Proteus (mirabilis) (morganii) as the cause of diseases classified elsewhere: Principal | ICD-10-CM

## 2020-04-30 DIAGNOSIS — E11621 Type 2 diabetes mellitus with foot ulcer: Principal | ICD-10-CM

## 2020-04-30 DIAGNOSIS — K449 Diaphragmatic hernia without obstruction or gangrene: Principal | ICD-10-CM

## 2020-04-30 DIAGNOSIS — Z882 Allergy status to sulfonamides status: Principal | ICD-10-CM

## 2020-04-30 DIAGNOSIS — I2510 Atherosclerotic heart disease of native coronary artery without angina pectoris: Principal | ICD-10-CM

## 2020-04-30 DIAGNOSIS — N139 Obstructive and reflux uropathy, unspecified: Principal | ICD-10-CM

## 2020-04-30 DIAGNOSIS — A4189 Other specified sepsis: Principal | ICD-10-CM

## 2020-04-30 DIAGNOSIS — E1142 Type 2 diabetes mellitus with diabetic polyneuropathy: Principal | ICD-10-CM

## 2020-04-30 DIAGNOSIS — E1169 Type 2 diabetes mellitus with other specified complication: Principal | ICD-10-CM

## 2020-04-30 DIAGNOSIS — N3946 Mixed incontinence: Principal | ICD-10-CM

## 2020-04-30 DIAGNOSIS — B004 Herpesviral encephalitis: Principal | ICD-10-CM

## 2020-04-30 DIAGNOSIS — D509 Iron deficiency anemia, unspecified: Principal | ICD-10-CM

## 2020-04-30 DIAGNOSIS — E785 Hyperlipidemia, unspecified: Principal | ICD-10-CM

## 2020-04-30 DIAGNOSIS — L89629 Pressure ulcer of left heel, unspecified stage: Principal | ICD-10-CM

## 2020-04-30 DIAGNOSIS — M069 Rheumatoid arthritis, unspecified: Principal | ICD-10-CM

## 2020-04-30 DIAGNOSIS — G4700 Insomnia, unspecified: Principal | ICD-10-CM

## 2020-04-30 DIAGNOSIS — Z4781 Encounter for orthopedic aftercare following surgical amputation: Principal | ICD-10-CM

## 2020-04-30 DIAGNOSIS — G2581 Restless legs syndrome: Principal | ICD-10-CM

## 2020-04-30 DIAGNOSIS — Z6841 Body Mass Index (BMI) 40.0 and over, adult: Principal | ICD-10-CM

## 2020-04-30 DIAGNOSIS — U099 Post covid-19 condition, unspecified: Principal | ICD-10-CM

## 2020-04-30 DIAGNOSIS — Z20822 Contact with and (suspected) exposure to covid-19: Principal | ICD-10-CM

## 2020-04-30 DIAGNOSIS — G894 Chronic pain syndrome: Principal | ICD-10-CM

## 2020-04-30 DIAGNOSIS — G4733 Obstructive sleep apnea (adult) (pediatric): Principal | ICD-10-CM

## 2020-04-30 DIAGNOSIS — Z87891 Personal history of nicotine dependence: Principal | ICD-10-CM

## 2020-04-30 DIAGNOSIS — L03116 Cellulitis of left lower limb: Principal | ICD-10-CM

## 2020-04-30 DIAGNOSIS — L97509 Non-pressure chronic ulcer of other part of unspecified foot with unspecified severity: Principal | ICD-10-CM

## 2020-04-30 DIAGNOSIS — J449 Chronic obstructive pulmonary disease, unspecified: Principal | ICD-10-CM

## 2020-04-30 DIAGNOSIS — M86171 Other acute osteomyelitis, right ankle and foot: Principal | ICD-10-CM

## 2020-04-30 DIAGNOSIS — J8410 Pulmonary fibrosis, unspecified: Principal | ICD-10-CM

## 2020-04-30 DIAGNOSIS — M160 Bilateral primary osteoarthritis of hip: Principal | ICD-10-CM

## 2020-04-30 DIAGNOSIS — E662 Morbid (severe) obesity with alveolar hypoventilation: Principal | ICD-10-CM

## 2020-04-30 DIAGNOSIS — E559 Vitamin D deficiency, unspecified: Principal | ICD-10-CM

## 2020-04-30 DIAGNOSIS — Z6837 Body mass index (BMI) 37.0-37.9, adult: Principal | ICD-10-CM

## 2020-04-30 DIAGNOSIS — E871 Hypo-osmolality and hyponatremia: Principal | ICD-10-CM

## 2020-05-01 ENCOUNTER — Encounter: Admit: 2020-05-01 | Discharge: 2020-05-30 | Payer: MEDICAID

## 2020-05-04 DIAGNOSIS — M25511 Pain in right shoulder: Principal | ICD-10-CM

## 2020-05-04 DIAGNOSIS — J8410 Pulmonary fibrosis, unspecified: Principal | ICD-10-CM

## 2020-05-04 DIAGNOSIS — I878 Other specified disorders of veins: Principal | ICD-10-CM

## 2020-05-04 DIAGNOSIS — K219 Gastro-esophageal reflux disease without esophagitis: Principal | ICD-10-CM

## 2020-05-04 DIAGNOSIS — L89629 Pressure ulcer of left heel, unspecified stage: Principal | ICD-10-CM

## 2020-05-04 DIAGNOSIS — E785 Hyperlipidemia, unspecified: Principal | ICD-10-CM

## 2020-05-04 DIAGNOSIS — E871 Hypo-osmolality and hyponatremia: Principal | ICD-10-CM

## 2020-05-04 DIAGNOSIS — G894 Chronic pain syndrome: Principal | ICD-10-CM

## 2020-05-04 DIAGNOSIS — L03115 Cellulitis of right lower limb: Principal | ICD-10-CM

## 2020-05-04 DIAGNOSIS — Z794 Long term (current) use of insulin: Principal | ICD-10-CM

## 2020-05-04 DIAGNOSIS — I110 Hypertensive heart disease with heart failure: Principal | ICD-10-CM

## 2020-05-04 DIAGNOSIS — G4700 Insomnia, unspecified: Principal | ICD-10-CM

## 2020-05-04 DIAGNOSIS — N139 Obstructive and reflux uropathy, unspecified: Principal | ICD-10-CM

## 2020-05-04 DIAGNOSIS — N3946 Mixed incontinence: Principal | ICD-10-CM

## 2020-05-04 DIAGNOSIS — M869 Osteomyelitis, unspecified: Principal | ICD-10-CM

## 2020-05-04 DIAGNOSIS — E1142 Type 2 diabetes mellitus with diabetic polyneuropathy: Principal | ICD-10-CM

## 2020-05-04 DIAGNOSIS — B004 Herpesviral encephalitis: Principal | ICD-10-CM

## 2020-05-04 DIAGNOSIS — R350 Frequency of micturition: Principal | ICD-10-CM

## 2020-05-04 DIAGNOSIS — F319 Bipolar disorder, unspecified: Principal | ICD-10-CM

## 2020-05-04 DIAGNOSIS — Z6841 Body Mass Index (BMI) 40.0 and over, adult: Principal | ICD-10-CM

## 2020-05-04 DIAGNOSIS — E1165 Type 2 diabetes mellitus with hyperglycemia: Principal | ICD-10-CM

## 2020-05-04 DIAGNOSIS — G928 Other toxic encephalopathy: Principal | ICD-10-CM

## 2020-05-04 DIAGNOSIS — Z20822 Contact with and (suspected) exposure to covid-19: Principal | ICD-10-CM

## 2020-05-04 DIAGNOSIS — E039 Hypothyroidism, unspecified: Principal | ICD-10-CM

## 2020-05-04 DIAGNOSIS — R21 Rash and other nonspecific skin eruption: Principal | ICD-10-CM

## 2020-05-04 DIAGNOSIS — I2510 Atherosclerotic heart disease of native coronary artery without angina pectoris: Principal | ICD-10-CM

## 2020-05-04 DIAGNOSIS — U099 Post covid-19 condition, unspecified: Principal | ICD-10-CM

## 2020-05-04 DIAGNOSIS — L03116 Cellulitis of left lower limb: Principal | ICD-10-CM

## 2020-05-04 DIAGNOSIS — G2581 Restless legs syndrome: Principal | ICD-10-CM

## 2020-05-04 DIAGNOSIS — M069 Rheumatoid arthritis, unspecified: Principal | ICD-10-CM

## 2020-05-04 DIAGNOSIS — E11621 Type 2 diabetes mellitus with foot ulcer: Principal | ICD-10-CM

## 2020-05-04 DIAGNOSIS — D509 Iron deficiency anemia, unspecified: Principal | ICD-10-CM

## 2020-05-04 DIAGNOSIS — E1169 Type 2 diabetes mellitus with other specified complication: Principal | ICD-10-CM

## 2020-05-04 DIAGNOSIS — E662 Morbid (severe) obesity with alveolar hypoventilation: Principal | ICD-10-CM

## 2020-05-04 DIAGNOSIS — F419 Anxiety disorder, unspecified: Principal | ICD-10-CM

## 2020-05-04 DIAGNOSIS — Z9119 Patient's noncompliance with other medical treatment and regimen: Principal | ICD-10-CM

## 2020-05-04 DIAGNOSIS — M160 Bilateral primary osteoarthritis of hip: Principal | ICD-10-CM

## 2020-05-04 DIAGNOSIS — K449 Diaphragmatic hernia without obstruction or gangrene: Principal | ICD-10-CM

## 2020-05-04 DIAGNOSIS — Z882 Allergy status to sulfonamides status: Principal | ICD-10-CM

## 2020-05-04 DIAGNOSIS — N179 Acute kidney failure, unspecified: Principal | ICD-10-CM

## 2020-05-04 DIAGNOSIS — Z87891 Personal history of nicotine dependence: Principal | ICD-10-CM

## 2020-05-04 DIAGNOSIS — I5032 Chronic diastolic (congestive) heart failure: Principal | ICD-10-CM

## 2020-05-04 DIAGNOSIS — K439 Ventral hernia without obstruction or gangrene: Principal | ICD-10-CM

## 2020-05-04 DIAGNOSIS — A4189 Other specified sepsis: Principal | ICD-10-CM

## 2020-05-04 DIAGNOSIS — L97509 Non-pressure chronic ulcer of other part of unspecified foot with unspecified severity: Principal | ICD-10-CM

## 2020-05-04 DIAGNOSIS — E876 Hypokalemia: Principal | ICD-10-CM

## 2020-05-05 DIAGNOSIS — B004 Herpesviral encephalitis: Principal | ICD-10-CM

## 2020-05-05 DIAGNOSIS — N3946 Mixed incontinence: Principal | ICD-10-CM

## 2020-05-05 DIAGNOSIS — K449 Diaphragmatic hernia without obstruction or gangrene: Principal | ICD-10-CM

## 2020-05-05 DIAGNOSIS — K439 Ventral hernia without obstruction or gangrene: Principal | ICD-10-CM

## 2020-05-05 DIAGNOSIS — Z6841 Body Mass Index (BMI) 40.0 and over, adult: Principal | ICD-10-CM

## 2020-05-05 DIAGNOSIS — M869 Osteomyelitis, unspecified: Principal | ICD-10-CM

## 2020-05-05 DIAGNOSIS — Z87891 Personal history of nicotine dependence: Principal | ICD-10-CM

## 2020-05-05 DIAGNOSIS — N139 Obstructive and reflux uropathy, unspecified: Principal | ICD-10-CM

## 2020-05-05 DIAGNOSIS — L97509 Non-pressure chronic ulcer of other part of unspecified foot with unspecified severity: Principal | ICD-10-CM

## 2020-05-05 DIAGNOSIS — E871 Hypo-osmolality and hyponatremia: Principal | ICD-10-CM

## 2020-05-05 DIAGNOSIS — K219 Gastro-esophageal reflux disease without esophagitis: Principal | ICD-10-CM

## 2020-05-05 DIAGNOSIS — F419 Anxiety disorder, unspecified: Principal | ICD-10-CM

## 2020-05-05 DIAGNOSIS — A4189 Other specified sepsis: Principal | ICD-10-CM

## 2020-05-05 DIAGNOSIS — E1165 Type 2 diabetes mellitus with hyperglycemia: Principal | ICD-10-CM

## 2020-05-05 DIAGNOSIS — R21 Rash and other nonspecific skin eruption: Principal | ICD-10-CM

## 2020-05-05 DIAGNOSIS — I110 Hypertensive heart disease with heart failure: Principal | ICD-10-CM

## 2020-05-05 DIAGNOSIS — L03115 Cellulitis of right lower limb: Principal | ICD-10-CM

## 2020-05-05 DIAGNOSIS — E1169 Type 2 diabetes mellitus with other specified complication: Principal | ICD-10-CM

## 2020-05-05 DIAGNOSIS — L03116 Cellulitis of left lower limb: Principal | ICD-10-CM

## 2020-05-05 DIAGNOSIS — E039 Hypothyroidism, unspecified: Principal | ICD-10-CM

## 2020-05-05 DIAGNOSIS — Z794 Long term (current) use of insulin: Principal | ICD-10-CM

## 2020-05-05 DIAGNOSIS — Z9119 Patient's noncompliance with other medical treatment and regimen: Principal | ICD-10-CM

## 2020-05-05 DIAGNOSIS — G4700 Insomnia, unspecified: Principal | ICD-10-CM

## 2020-05-05 DIAGNOSIS — F319 Bipolar disorder, unspecified: Principal | ICD-10-CM

## 2020-05-05 DIAGNOSIS — Z882 Allergy status to sulfonamides status: Principal | ICD-10-CM

## 2020-05-05 DIAGNOSIS — D509 Iron deficiency anemia, unspecified: Principal | ICD-10-CM

## 2020-05-05 DIAGNOSIS — I5032 Chronic diastolic (congestive) heart failure: Principal | ICD-10-CM

## 2020-05-05 DIAGNOSIS — E11621 Type 2 diabetes mellitus with foot ulcer: Principal | ICD-10-CM

## 2020-05-05 DIAGNOSIS — M160 Bilateral primary osteoarthritis of hip: Principal | ICD-10-CM

## 2020-05-05 DIAGNOSIS — I878 Other specified disorders of veins: Principal | ICD-10-CM

## 2020-05-05 DIAGNOSIS — E662 Morbid (severe) obesity with alveolar hypoventilation: Principal | ICD-10-CM

## 2020-05-05 DIAGNOSIS — U099 Post covid-19 condition, unspecified: Principal | ICD-10-CM

## 2020-05-05 DIAGNOSIS — E785 Hyperlipidemia, unspecified: Principal | ICD-10-CM

## 2020-05-05 DIAGNOSIS — G928 Other toxic encephalopathy: Principal | ICD-10-CM

## 2020-05-05 DIAGNOSIS — J8410 Pulmonary fibrosis, unspecified: Principal | ICD-10-CM

## 2020-05-05 DIAGNOSIS — G894 Chronic pain syndrome: Principal | ICD-10-CM

## 2020-05-05 DIAGNOSIS — I2510 Atherosclerotic heart disease of native coronary artery without angina pectoris: Principal | ICD-10-CM

## 2020-05-05 DIAGNOSIS — L89629 Pressure ulcer of left heel, unspecified stage: Principal | ICD-10-CM

## 2020-05-05 DIAGNOSIS — Z20822 Contact with and (suspected) exposure to covid-19: Principal | ICD-10-CM

## 2020-05-05 DIAGNOSIS — N179 Acute kidney failure, unspecified: Principal | ICD-10-CM

## 2020-05-05 DIAGNOSIS — M25511 Pain in right shoulder: Principal | ICD-10-CM

## 2020-05-05 DIAGNOSIS — E1142 Type 2 diabetes mellitus with diabetic polyneuropathy: Principal | ICD-10-CM

## 2020-05-05 DIAGNOSIS — E876 Hypokalemia: Principal | ICD-10-CM

## 2020-05-05 DIAGNOSIS — M069 Rheumatoid arthritis, unspecified: Principal | ICD-10-CM

## 2020-05-05 DIAGNOSIS — R350 Frequency of micturition: Principal | ICD-10-CM

## 2020-05-05 DIAGNOSIS — G2581 Restless legs syndrome: Principal | ICD-10-CM

## 2020-05-06 DIAGNOSIS — M0579 Rheumatoid arthritis with rheumatoid factor of multiple sites without organ or systems involvement: Principal | ICD-10-CM

## 2020-05-06 DIAGNOSIS — M069 Rheumatoid arthritis, unspecified: Principal | ICD-10-CM

## 2020-05-07 ENCOUNTER — Ambulatory Visit: Payer: Medicaid Other | Admitting: Internal Medicine

## 2020-05-12 ENCOUNTER — Other Ambulatory Visit: Payer: Self-pay | Admitting: Psychiatry

## 2020-05-12 MED ORDER — TRADJENTA 5 MG TABLET
ORAL_TABLET | 11 refills | 0 days
Start: 2020-05-12 — End: ?

## 2020-05-13 ENCOUNTER — Encounter: Payer: Medicaid Other | Admitting: Student in an Organized Health Care Education/Training Program

## 2020-05-13 MED ORDER — TRADJENTA 5 MG TABLET
ORAL_TABLET | ORAL | 11 refills | 0.00000 days | Status: SS
Start: 2020-05-13 — End: ?

## 2020-05-14 DIAGNOSIS — E559 Vitamin D deficiency, unspecified: Principal | ICD-10-CM

## 2020-05-14 DIAGNOSIS — F319 Bipolar disorder, unspecified: Principal | ICD-10-CM

## 2020-05-14 DIAGNOSIS — A411 Sepsis due to other specified staphylococcus: Principal | ICD-10-CM

## 2020-05-14 DIAGNOSIS — I110 Hypertensive heart disease with heart failure: Principal | ICD-10-CM

## 2020-05-14 DIAGNOSIS — U099 Post covid-19 condition, unspecified: Principal | ICD-10-CM

## 2020-05-14 DIAGNOSIS — M86171 Other acute osteomyelitis, right ankle and foot: Principal | ICD-10-CM

## 2020-05-14 DIAGNOSIS — F419 Anxiety disorder, unspecified: Principal | ICD-10-CM

## 2020-05-14 DIAGNOSIS — J8410 Pulmonary fibrosis, unspecified: Principal | ICD-10-CM

## 2020-05-14 DIAGNOSIS — K219 Gastro-esophageal reflux disease without esophagitis: Principal | ICD-10-CM

## 2020-05-14 DIAGNOSIS — G894 Chronic pain syndrome: Principal | ICD-10-CM

## 2020-05-14 DIAGNOSIS — E1342 Other specified diabetes mellitus with diabetic polyneuropathy: Principal | ICD-10-CM

## 2020-05-14 DIAGNOSIS — Z452 Encounter for adjustment and management of vascular access device: Principal | ICD-10-CM

## 2020-05-14 DIAGNOSIS — L97528 Non-pressure chronic ulcer of other part of left foot with other specified severity: Principal | ICD-10-CM

## 2020-05-14 DIAGNOSIS — G4733 Obstructive sleep apnea (adult) (pediatric): Principal | ICD-10-CM

## 2020-05-14 DIAGNOSIS — N3946 Mixed incontinence: Principal | ICD-10-CM

## 2020-05-14 DIAGNOSIS — I5032 Chronic diastolic (congestive) heart failure: Principal | ICD-10-CM

## 2020-05-14 DIAGNOSIS — E1369 Other specified diabetes mellitus with other specified complication: Principal | ICD-10-CM

## 2020-05-14 DIAGNOSIS — T8754 Necrosis of amputation stump, left lower extremity: Principal | ICD-10-CM

## 2020-05-14 DIAGNOSIS — J9611 Chronic respiratory failure with hypoxia: Principal | ICD-10-CM

## 2020-05-14 DIAGNOSIS — J449 Chronic obstructive pulmonary disease, unspecified: Principal | ICD-10-CM

## 2020-05-14 DIAGNOSIS — M0579 Rheumatoid arthritis with rheumatoid factor of multiple sites without organ or systems involvement: Principal | ICD-10-CM

## 2020-05-14 DIAGNOSIS — E039 Hypothyroidism, unspecified: Principal | ICD-10-CM

## 2020-05-14 DIAGNOSIS — E13621 Other specified diabetes mellitus with foot ulcer: Principal | ICD-10-CM

## 2020-05-14 DIAGNOSIS — A4102 Sepsis due to Methicillin resistant Staphylococcus aureus: Principal | ICD-10-CM

## 2020-05-14 MED ORDER — METRONIDAZOLE 500 MG TABLET
ORAL_TABLET | Freq: Three times a day (TID) | ORAL | 0 refills | 7.00000 days | Status: SS
Start: 2020-05-14 — End: 2020-05-21

## 2020-05-14 MED ORDER — CEFEPIME 2 GRAM/100 ML IN DEXTROSE (ISO-OSMOTIC) INTRAVENOUS PIGGYBACK
Freq: Three times a day (TID) | INTRAVENOUS | 0 refills | 7.00000 days | Status: SS
Start: 2020-05-14 — End: 2020-05-21

## 2020-05-14 MED ORDER — TRESIBA FLEXTOUCH U-200 INSULIN 200 UNIT/ML (3 ML) SUBCUTANEOUS PEN
Freq: Every evening | SUBCUTANEOUS | 0 refills | 30.00000 days | Status: SS
Start: 2020-05-14 — End: 2020-06-13

## 2020-05-14 MED ORDER — VANCOMYCIN 1 GRAM/200 ML IN DEXTROSE 5 % INTRAVENOUS PIGGYBACK
Freq: Two times a day (BID) | INTRAVENOUS | 0 refills | 7.00000 days | Status: SS
Start: 2020-05-14 — End: 2020-05-21

## 2020-05-14 MED ORDER — CAPSAICIN 0.025 % TOPICAL CREAM
Freq: Two times a day (BID) | TOPICAL | 0 refills | 0.00000 days | Status: SS
Start: 2020-05-14 — End: 2021-05-14

## 2020-05-14 MED ORDER — ACETIC ACID (BULK) 1 % LIQUID
Freq: Every day | TOPICAL | 3 refills | 10.00000 days | Status: SS | PRN
Start: 2020-05-14 — End: ?

## 2020-05-15 DIAGNOSIS — A411 Sepsis due to other specified staphylococcus: Principal | ICD-10-CM

## 2020-05-15 DIAGNOSIS — E785 Hyperlipidemia, unspecified: Principal | ICD-10-CM

## 2020-05-15 DIAGNOSIS — E559 Vitamin D deficiency, unspecified: Principal | ICD-10-CM

## 2020-05-15 DIAGNOSIS — J449 Chronic obstructive pulmonary disease, unspecified: Principal | ICD-10-CM

## 2020-05-15 DIAGNOSIS — E119 Type 2 diabetes mellitus without complications: Principal | ICD-10-CM

## 2020-05-15 DIAGNOSIS — E1342 Other specified diabetes mellitus with diabetic polyneuropathy: Principal | ICD-10-CM

## 2020-05-15 DIAGNOSIS — F319 Bipolar disorder, unspecified: Principal | ICD-10-CM

## 2020-05-15 DIAGNOSIS — J9611 Chronic respiratory failure with hypoxia: Principal | ICD-10-CM

## 2020-05-15 DIAGNOSIS — E1369 Other specified diabetes mellitus with other specified complication: Principal | ICD-10-CM

## 2020-05-15 DIAGNOSIS — E039 Hypothyroidism, unspecified: Principal | ICD-10-CM

## 2020-05-15 DIAGNOSIS — Z452 Encounter for adjustment and management of vascular access device: Principal | ICD-10-CM

## 2020-05-15 DIAGNOSIS — G894 Chronic pain syndrome: Principal | ICD-10-CM

## 2020-05-15 DIAGNOSIS — M86171 Other acute osteomyelitis, right ankle and foot: Principal | ICD-10-CM

## 2020-05-15 DIAGNOSIS — K219 Gastro-esophageal reflux disease without esophagitis: Principal | ICD-10-CM

## 2020-05-15 DIAGNOSIS — N3946 Mixed incontinence: Principal | ICD-10-CM

## 2020-05-15 DIAGNOSIS — I110 Hypertensive heart disease with heart failure: Principal | ICD-10-CM

## 2020-05-15 DIAGNOSIS — G4733 Obstructive sleep apnea (adult) (pediatric): Principal | ICD-10-CM

## 2020-05-15 DIAGNOSIS — M0579 Rheumatoid arthritis with rheumatoid factor of multiple sites without organ or systems involvement: Principal | ICD-10-CM

## 2020-05-15 DIAGNOSIS — I5032 Chronic diastolic (congestive) heart failure: Principal | ICD-10-CM

## 2020-05-15 DIAGNOSIS — F419 Anxiety disorder, unspecified: Principal | ICD-10-CM

## 2020-05-15 DIAGNOSIS — J8410 Pulmonary fibrosis, unspecified: Principal | ICD-10-CM

## 2020-05-15 DIAGNOSIS — T8754 Necrosis of amputation stump, left lower extremity: Principal | ICD-10-CM

## 2020-05-15 DIAGNOSIS — E13621 Other specified diabetes mellitus with foot ulcer: Principal | ICD-10-CM

## 2020-05-15 DIAGNOSIS — R110 Nausea: Principal | ICD-10-CM

## 2020-05-15 DIAGNOSIS — Z79899 Other long term (current) drug therapy: Principal | ICD-10-CM

## 2020-05-15 DIAGNOSIS — Z794 Long term (current) use of insulin: Principal | ICD-10-CM

## 2020-05-15 DIAGNOSIS — U099 Post covid-19 condition, unspecified: Principal | ICD-10-CM

## 2020-05-15 DIAGNOSIS — A4102 Sepsis due to Methicillin resistant Staphylococcus aureus: Principal | ICD-10-CM

## 2020-05-15 DIAGNOSIS — L97528 Non-pressure chronic ulcer of other part of left foot with other specified severity: Principal | ICD-10-CM

## 2020-05-15 MED ORDER — AMLODIPINE 10 MG TABLET
ORAL_TABLET | Freq: Every day | ORAL | 3 refills | 90.00000 days | Status: SS
Start: 2020-05-15 — End: 2021-05-15

## 2020-05-15 MED ORDER — PROMETHAZINE 25 MG TABLET
ORAL_TABLET | Freq: Four times a day (QID) | ORAL | 0 refills | 10.00000 days | Status: SS | PRN
Start: 2020-05-15 — End: 2020-05-25

## 2020-05-15 MED ORDER — SODIUM HYPOCHLORITE 0.125 % SOLUTION
Freq: Every day | TOPICAL | 3 refills | 0.00000 days | Status: SS
Start: 2020-05-15 — End: 2020-06-14

## 2020-05-16 ENCOUNTER — Ambulatory Visit
Admit: 2020-05-16 | Discharge: 2020-05-16 | Discharge status: Against Medical Advice | Payer: MEDICAID | Attending: Emergency Medicine

## 2020-05-16 DIAGNOSIS — Z452 Encounter for adjustment and management of vascular access device: Principal | ICD-10-CM

## 2020-05-16 DIAGNOSIS — J449 Chronic obstructive pulmonary disease, unspecified: Principal | ICD-10-CM

## 2020-05-16 DIAGNOSIS — A411 Sepsis due to other specified staphylococcus: Principal | ICD-10-CM

## 2020-05-16 DIAGNOSIS — G894 Chronic pain syndrome: Principal | ICD-10-CM

## 2020-05-16 DIAGNOSIS — U099 Post covid-19 condition, unspecified: Principal | ICD-10-CM

## 2020-05-16 DIAGNOSIS — E1369 Other specified diabetes mellitus with other specified complication: Principal | ICD-10-CM

## 2020-05-16 DIAGNOSIS — E559 Vitamin D deficiency, unspecified: Principal | ICD-10-CM

## 2020-05-16 DIAGNOSIS — L97528 Non-pressure chronic ulcer of other part of left foot with other specified severity: Principal | ICD-10-CM

## 2020-05-16 DIAGNOSIS — M0579 Rheumatoid arthritis with rheumatoid factor of multiple sites without organ or systems involvement: Principal | ICD-10-CM

## 2020-05-16 DIAGNOSIS — I5032 Chronic diastolic (congestive) heart failure: Principal | ICD-10-CM

## 2020-05-16 DIAGNOSIS — I110 Hypertensive heart disease with heart failure: Principal | ICD-10-CM

## 2020-05-16 DIAGNOSIS — F319 Bipolar disorder, unspecified: Principal | ICD-10-CM

## 2020-05-16 DIAGNOSIS — M069 Rheumatoid arthritis, unspecified: Principal | ICD-10-CM

## 2020-05-16 DIAGNOSIS — A4102 Sepsis due to Methicillin resistant Staphylococcus aureus: Principal | ICD-10-CM

## 2020-05-16 DIAGNOSIS — J9611 Chronic respiratory failure with hypoxia: Principal | ICD-10-CM

## 2020-05-16 DIAGNOSIS — E13621 Other specified diabetes mellitus with foot ulcer: Principal | ICD-10-CM

## 2020-05-16 DIAGNOSIS — M86171 Other acute osteomyelitis, right ankle and foot: Principal | ICD-10-CM

## 2020-05-16 DIAGNOSIS — J8410 Pulmonary fibrosis, unspecified: Principal | ICD-10-CM

## 2020-05-16 DIAGNOSIS — G4733 Obstructive sleep apnea (adult) (pediatric): Principal | ICD-10-CM

## 2020-05-16 DIAGNOSIS — N3946 Mixed incontinence: Principal | ICD-10-CM

## 2020-05-16 DIAGNOSIS — T8754 Necrosis of amputation stump, left lower extremity: Principal | ICD-10-CM

## 2020-05-16 DIAGNOSIS — E039 Hypothyroidism, unspecified: Principal | ICD-10-CM

## 2020-05-16 DIAGNOSIS — F419 Anxiety disorder, unspecified: Principal | ICD-10-CM

## 2020-05-16 DIAGNOSIS — K219 Gastro-esophageal reflux disease without esophagitis: Principal | ICD-10-CM

## 2020-05-16 DIAGNOSIS — E1342 Other specified diabetes mellitus with diabetic polyneuropathy: Principal | ICD-10-CM

## 2020-05-19 DIAGNOSIS — T8754 Necrosis of amputation stump, left lower extremity: Principal | ICD-10-CM

## 2020-05-19 DIAGNOSIS — K219 Gastro-esophageal reflux disease without esophagitis: Principal | ICD-10-CM

## 2020-05-19 DIAGNOSIS — F319 Bipolar disorder, unspecified: Principal | ICD-10-CM

## 2020-05-19 DIAGNOSIS — F419 Anxiety disorder, unspecified: Principal | ICD-10-CM

## 2020-05-19 DIAGNOSIS — Z452 Encounter for adjustment and management of vascular access device: Principal | ICD-10-CM

## 2020-05-19 DIAGNOSIS — J9611 Chronic respiratory failure with hypoxia: Principal | ICD-10-CM

## 2020-05-19 DIAGNOSIS — I5032 Chronic diastolic (congestive) heart failure: Principal | ICD-10-CM

## 2020-05-19 DIAGNOSIS — J8410 Pulmonary fibrosis, unspecified: Principal | ICD-10-CM

## 2020-05-19 DIAGNOSIS — E1342 Other specified diabetes mellitus with diabetic polyneuropathy: Principal | ICD-10-CM

## 2020-05-19 DIAGNOSIS — J449 Chronic obstructive pulmonary disease, unspecified: Principal | ICD-10-CM

## 2020-05-19 DIAGNOSIS — A411 Sepsis due to other specified staphylococcus: Principal | ICD-10-CM

## 2020-05-19 DIAGNOSIS — A4102 Sepsis due to Methicillin resistant Staphylococcus aureus: Principal | ICD-10-CM

## 2020-05-19 DIAGNOSIS — E1369 Other specified diabetes mellitus with other specified complication: Principal | ICD-10-CM

## 2020-05-19 DIAGNOSIS — I110 Hypertensive heart disease with heart failure: Principal | ICD-10-CM

## 2020-05-19 DIAGNOSIS — E559 Vitamin D deficiency, unspecified: Principal | ICD-10-CM

## 2020-05-19 DIAGNOSIS — E13621 Other specified diabetes mellitus with foot ulcer: Principal | ICD-10-CM

## 2020-05-19 DIAGNOSIS — L97528 Non-pressure chronic ulcer of other part of left foot with other specified severity: Principal | ICD-10-CM

## 2020-05-19 DIAGNOSIS — G4733 Obstructive sleep apnea (adult) (pediatric): Principal | ICD-10-CM

## 2020-05-19 DIAGNOSIS — M0579 Rheumatoid arthritis with rheumatoid factor of multiple sites without organ or systems involvement: Principal | ICD-10-CM

## 2020-05-19 DIAGNOSIS — E039 Hypothyroidism, unspecified: Principal | ICD-10-CM

## 2020-05-19 DIAGNOSIS — N3946 Mixed incontinence: Principal | ICD-10-CM

## 2020-05-19 DIAGNOSIS — M86171 Other acute osteomyelitis, right ankle and foot: Principal | ICD-10-CM

## 2020-05-19 DIAGNOSIS — U099 Post covid-19 condition, unspecified: Principal | ICD-10-CM

## 2020-05-19 DIAGNOSIS — G894 Chronic pain syndrome: Principal | ICD-10-CM

## 2020-05-20 ENCOUNTER — Other Ambulatory Visit: Admit: 2020-05-20 | Discharge: 2020-05-21 | Payer: MEDICAID

## 2020-05-20 DIAGNOSIS — M86171 Other acute osteomyelitis, right ankle and foot: Principal | ICD-10-CM

## 2020-05-20 DIAGNOSIS — J9611 Chronic respiratory failure with hypoxia: Principal | ICD-10-CM

## 2020-05-20 DIAGNOSIS — G4733 Obstructive sleep apnea (adult) (pediatric): Principal | ICD-10-CM

## 2020-05-20 DIAGNOSIS — E13621 Other specified diabetes mellitus with foot ulcer: Principal | ICD-10-CM

## 2020-05-20 DIAGNOSIS — J8410 Pulmonary fibrosis, unspecified: Principal | ICD-10-CM

## 2020-05-20 DIAGNOSIS — G894 Chronic pain syndrome: Principal | ICD-10-CM

## 2020-05-20 DIAGNOSIS — I5032 Chronic diastolic (congestive) heart failure: Principal | ICD-10-CM

## 2020-05-20 DIAGNOSIS — A411 Sepsis due to other specified staphylococcus: Principal | ICD-10-CM

## 2020-05-20 DIAGNOSIS — I110 Hypertensive heart disease with heart failure: Principal | ICD-10-CM

## 2020-05-20 DIAGNOSIS — N3946 Mixed incontinence: Principal | ICD-10-CM

## 2020-05-20 DIAGNOSIS — E559 Vitamin D deficiency, unspecified: Principal | ICD-10-CM

## 2020-05-20 DIAGNOSIS — M0579 Rheumatoid arthritis with rheumatoid factor of multiple sites without organ or systems involvement: Principal | ICD-10-CM

## 2020-05-20 DIAGNOSIS — E1342 Other specified diabetes mellitus with diabetic polyneuropathy: Principal | ICD-10-CM

## 2020-05-20 DIAGNOSIS — E039 Hypothyroidism, unspecified: Principal | ICD-10-CM

## 2020-05-20 DIAGNOSIS — Z452 Encounter for adjustment and management of vascular access device: Principal | ICD-10-CM

## 2020-05-20 DIAGNOSIS — T8754 Necrosis of amputation stump, left lower extremity: Principal | ICD-10-CM

## 2020-05-20 DIAGNOSIS — A4102 Sepsis due to Methicillin resistant Staphylococcus aureus: Principal | ICD-10-CM

## 2020-05-20 DIAGNOSIS — E1369 Other specified diabetes mellitus with other specified complication: Principal | ICD-10-CM

## 2020-05-20 DIAGNOSIS — J449 Chronic obstructive pulmonary disease, unspecified: Principal | ICD-10-CM

## 2020-05-20 DIAGNOSIS — F319 Bipolar disorder, unspecified: Principal | ICD-10-CM

## 2020-05-20 DIAGNOSIS — U099 Post covid-19 condition, unspecified: Principal | ICD-10-CM

## 2020-05-20 DIAGNOSIS — K219 Gastro-esophageal reflux disease without esophagitis: Principal | ICD-10-CM

## 2020-05-20 DIAGNOSIS — L97528 Non-pressure chronic ulcer of other part of left foot with other specified severity: Principal | ICD-10-CM

## 2020-05-20 DIAGNOSIS — F419 Anxiety disorder, unspecified: Principal | ICD-10-CM

## 2020-05-20 MED ORDER — METHOTREXATE SODIUM 2.5 MG TABLET
ORAL_TABLET | ORAL | 0 refills | 28.00000 days | Status: SS
Start: 2020-05-20 — End: ?

## 2020-05-21 ENCOUNTER — Encounter: Payer: Self-pay | Admitting: Pulmonary Disease

## 2020-05-21 ENCOUNTER — Ambulatory Visit (INDEPENDENT_AMBULATORY_CARE_PROVIDER_SITE_OTHER): Payer: Medicaid Other | Admitting: Pulmonary Disease

## 2020-05-21 ENCOUNTER — Other Ambulatory Visit
Admission: RE | Admit: 2020-05-21 | Discharge: 2020-05-21 | Disposition: A | Discharge status: Home / Self Care | Payer: Medicaid Other | Source: Ambulatory Visit | Attending: Pulmonary Disease | Admitting: Pulmonary Disease

## 2020-05-21 ENCOUNTER — Ambulatory Visit
Admission: RE | Admit: 2020-05-21 | Discharge: 2020-05-21 | Disposition: A | Discharge status: Home / Self Care | Payer: Medicaid Other | Source: Ambulatory Visit | Attending: Pulmonary Disease | Admitting: Pulmonary Disease

## 2020-05-21 ENCOUNTER — Other Ambulatory Visit: Payer: Self-pay

## 2020-05-21 VITALS — BP 118/68 | HR 96 | Wt 320.0 lb

## 2020-05-21 DIAGNOSIS — R0602 Shortness of breath: Secondary | ICD-10-CM | POA: Insufficient documentation

## 2020-05-21 DIAGNOSIS — J8410 Pulmonary fibrosis, unspecified: Secondary | ICD-10-CM

## 2020-05-21 DIAGNOSIS — M868X7 Other osteomyelitis, ankle and foot: Secondary | ICD-10-CM

## 2020-05-21 DIAGNOSIS — I5032 Chronic diastolic (congestive) heart failure: Secondary | ICD-10-CM

## 2020-05-21 DIAGNOSIS — J9611 Chronic respiratory failure with hypoxia: Secondary | ICD-10-CM | POA: Diagnosis not present

## 2020-05-21 DIAGNOSIS — M069 Rheumatoid arthritis, unspecified: Secondary | ICD-10-CM

## 2020-05-21 DIAGNOSIS — Z6841 Body Mass Index (BMI) 40.0 and over, adult: Secondary | ICD-10-CM

## 2020-05-21 DIAGNOSIS — R5381 Other malaise: Secondary | ICD-10-CM

## 2020-05-21 LAB — RENAL FUNCTION PANEL
Albumin: 3.3 g/dL — ABNORMAL LOW (ref 3.5–5.0)
Anion gap: 13 (ref 5–15)
BUN: 13 mg/dL (ref 6–20)
CO2: 31 mmol/L (ref 22–32)
Calcium: 9.2 mg/dL (ref 8.9–10.3)
Chloride: 98 mmol/L (ref 98–111)
Creatinine, Ser: 0.9 mg/dL (ref 0.44–1.00)
GFR, Estimated: >60 mL/min (ref >60)
Glucose, Bld: 144 mg/dL — ABNORMAL HIGH (ref 70–99)
Phosphorus: 2.8 mg/dL (ref 2.5–4.6)
Potassium: 4.8 mmol/L (ref 3.5–5.1)
Sodium: 142 mmol/L (ref 135–145)

## 2020-05-21 LAB — CBC WITH DIFFERENTIAL/PLATELET
Abs Immature Granulocytes: 0.08 10*3/uL — ABNORMAL HIGH (ref 0.00–0.07)
Basophils Absolute: 0.1 10*3/uL (ref 0.0–0.1)
Basophils Relative: 1 %
Eosinophils Absolute: 0 10*3/uL (ref 0.0–0.5)
Eosinophils Relative: 0 %
HCT: 37.1 % (ref 36.0–46.0)
Hemoglobin: 10.2 g/dL — ABNORMAL LOW (ref 12.0–15.0)
Immature Granulocytes: 1 %
Lymphocytes Relative: 8 %
Lymphs Abs: 0.9 10*3/uL (ref 0.7–4.0)
MCH: 20.5 pg — ABNORMAL LOW (ref 26.0–34.0)
MCHC: 27.5 g/dL — ABNORMAL LOW (ref 30.0–36.0)
MCV: 74.5 fL — ABNORMAL LOW (ref 80.0–100.0)
Monocytes Absolute: 0.6 10*3/uL (ref 0.1–1.0)
Monocytes Relative: 6 %
Neutro Abs: 9.5 10*3/uL — ABNORMAL HIGH (ref 1.7–7.7)
Neutrophils Relative %: 84 %
Platelets: 259 10*3/uL (ref 150–400)
RBC: 4.98 MIL/uL (ref 3.87–5.11)
RDW: 23.1 % — ABNORMAL HIGH (ref 11.5–15.5)
Smear Review: NORMAL
WBC: 11.1 10*3/uL — ABNORMAL HIGH (ref 4.0–10.5)
nRBC: 0 % (ref 0.0–0.2)

## 2020-05-21 LAB — FIBRIN DERIVATIVES D-DIMER (ARMC ONLY): Fibrin derivatives D-dimer (ARMC): 1001.8 ng/mL (FEU) — ABNORMAL HIGH (ref 0.00–499.00)

## 2020-05-21 LAB — BRAIN NATRIURETIC PEPTIDE: B Natriuretic Peptide: 28.7 pg/mL (ref 0.0–100.0)

## 2020-05-21 NOTE — Progress Notes (Signed)
Subjective:    Patient ID: [name] [name], female    DOB: Mar 28, 1961, 60 y.o.   MRN: [id number]  HPI [name] [name] is a very complex 59 year old former smoker (quit 2001, 54-pack-year history) who presents as a working evaluation due to having mistaken her appointment for 8 February for today.  She states that she has severe dyspnea on exertion.  She presented with her oxygen tank being off and her saturations were noted to be 83% on room air.  She is to be on 5 l/min of oxygen.  She was placed on 3 L/min on one of her clinic tanks and saturations were noted to be 96%.  Subsequently we got her tank working.  She is very complex as noted above.  She is morbidly obese with a BMI of 52, she has underlying rheumatoid arthritis, prolonged hospitalization in December 2020 for COVID-19 pneumonia and subsequent post inflammatory pulmonary fibrosis.  She has diastolic dysfunction, recent hospitalization at [facility] (January 2022) for septicemia due to osteomyelitis requiring amputation of her big toe on the left foot, she remains on antibiotic infusions at home.  The patient was noted to be severely encephalopathic during her most recent admission at [facility].  Today she has complaint of dyspnea on exertion which is really not new for her.  She has been having difficulties since her COVID-19 prolonged hospitalization.  In the past she has been treated with diuretics for diastolic dysfunction, anticoagulants for splenic infarcts and methotrexate for rheumatoid arthritis.  Currently she is not on any of those medications.  She was told that the diuretic was to be on an as-needed basis.  She cannot tell when this is.  Most recent report of chest x-ray from [facility] showed that she had some volume overload likely from diastolic dysfunction.  She has not had any fevers, chills or sweats since her discharge from [facility].  She has upcoming appointments with infectious diseases, podiatry/orthopedics and primary care.   Review of  Systems A 10 point review of systems was performed and it is as noted above otherwise negative.  Patient Active Problem List   Diagnosis Date Noted  . 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 Chronic respiratory failure with hypoxia (HCC) 02/07/2020  . Anemia 02/07/2020  . Lower extremity cellulitis 01/10/2020  . Bacteremia   . Paroxysmal A-fib (HCC)   . Right arm weakness   . Ileus (HCC)   . Splenic infarct   . Acute metabolic encephalopathy 07/08/2019  . Hyperglycemia due to type 2 diabetes mellitus (HCC) 07/08/2019  . Morbid obesity with BMI of 50.0-59.9, adult (HCC) 07/08/2019  . Severe sepsis with septic shock (HCC) 07/08/2019  . Septic shock (HCC) 07/08/2019  . History of severe acute respiratory syndrome coronavirus 2 (SARS-CoV-2) disease 05/24/2019  . Acute respiratory disease due to COVID-19 virus 04/06/2019  . Type 2 diabetes mellitus without complication, without long-term current use of insulin (HCC) 04/06/2019  . CKD (chronic kidney disease), stage III (HCC) 04/06/2019  . HLD (hyperlipidemia) 04/06/2019  . COVID-19 03/27/2019  . Pneumonia due to COVID-19 virus 03/27/2019  . Abdominal pain 02/12/2019  . AKI (acute kidney injury) (HCC) 02/12/2019  . Cellulitis, abdominal wall 02/04/2019  . Elevated C-reactive protein (CRP) 12/15/2018  . Elevated sed rate 12/15/2018  . Pharmacologic therapy 11/06/2018  . Disorder of skeletal system 11/06/2018  . Problems influencing health status 11/06/2018  . Decubitus ulcer of heel, bilateral 02/21/2018  . Diabetic ulcer of toe of right foot associated with type 2 diabetes  mellitus (HCC) 02/14/2018  . Ulcer of left heel and midfoot with fat layer exposed (HCC) 02/14/2018  . Sepsis due to methicillin resistant Staphylococcus aureus (MRSA) without acute organ dysfunction (HCC) 11/17/2017  . MRSA (methicillin resistant Staphylococcus aureus) infection 11/17/2017  . Cellulitis 11/15/2017  . Perineal abscess 11/15/2017   . Subacute vulvitis 11/01/2017  . Chronic cystitis 03/01/2017  . Chronic pain syndrome 03/31/2016  . Insomnia secondary to chronic pain 03/31/2016  . Chronic upper back pain 12/25/2015  . Chronic hand pain (Bilateral) (L>R) 12/25/2015  . Rheumatoid arthritis (HCC) 12/25/2015  . Osteoarthritis, multiple sites 12/24/2015  . Chronic foot pain (Primary Area of Pain) (Bilateral) (L>R) 12/24/2015  . Chronic elbow pain (Third area of Pain) (Bilateral) (L>R) 12/24/2015  . Chronic shoulder pain (Bilateral) (L>R) 12/24/2015  . Chronic neck pain (Bilateral) (R>L) 12/24/2015  . 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[employer]) 12/23/2015  . Chronic knee pain (Bilateral) (R>L) 12/23/2015  . Long term current use of opiate analgesic 12/23/2015  . Long term prescription opiate use 12/23/2015  . Opiate use (30 MME/Day) 12/23/2015  . Neurogenic pain 12/23/2015  . Neuropathic pain 12/23/2015  . Diabetic peripheral neuropathy (HCC) 12/23/2015  . Encounter for therapeutic drug level monitoring 12/23/2015  . Encounter for pain management planning 12/23/2015  . GERD (gastroesophageal reflux disease) 11/25/2015  . Neuropathy 11/25/2015  . Hypokalemia due to loss of potassium 10/21/2015  . Hypomagnesemia 10/21/2015  . QT prolongation 10/21/2015  . 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 Chronic wrist pain (Secondary area of Pain) (Bilateral) (L>R) 03/19/2015  . 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 Female genuine stress incontinence 02/14/2015  . Urge incontinence of urine 02/14/2015  . OSA (obstructive sleep apnea) 07/03/2014  . Chronic diastolic CHF (congestive heart failure) (HCC) 07/03/2014  . Anxiety 01/03/2014  . Diabetic ulcer of heel (HCC) 01/03/2014  . COPD (chronic obstructive pulmonary disease) (HCC) 01/03/2014  . Bipolar disorder, unspecified (HCC) 01/03/2014  . Diastolic dysfunction 01/03/2014  . Combined fat and carbohydrate induced hyperlipemia  01/03/2014  . Shortness of breath 01/03/2014  . Acute on chronic congestive heart failure (HCC) 01/03/2014  . Incomplete bladder emptying 11/02/2012  . Bladder retention 10/10/2012  . Detrusor muscle hypertonia 10/04/2012  . Obstruction of urinary tract 10/04/2012  . FOM (frequency of micturition) 10/04/2012  . Mixed incontinence 10/04/2012  . Vitamin D insufficiency 04/15/2012  . Borderline personality disorder (HCC) 01/06/2012  . Anxiety and depression 01/06/2012  . History of laparoscopic adjustable gastric banding, 03/20/2007.  Removed 09/19/2011. 08/04/2011  . Nausea & vomiting 08/04/2011  . Hypothyroidism 06/28/2010  . Rheumatoid arthritis involving multiple sites with positive rheumatoid factor (HCC) 06/28/2010  . Obesity 03/29/2005  . Essential (primary) hypertension 01/27/2005  . Major depressive disorder, recurrent episode, moderate (HCC) 06/03/2004   Allergies  Allergen Reactions  . Cephalexin Hives  . Codeine Palpitations, Nausea Only, Nausea And Vomiting, Rash and Shortness Of Breath    "makes heart fly, she gets flushed and passes out"  . Doxycycline Rash  . Propoxyphene Rash and Shortness Of Breath    Increase heart rate  . Sulfa Antibiotics Palpitations, Nausea Only, Shortness Of Breath and Hives    "makes heart fly, she gets flushed and passes out"  . Lovenox [Enoxaparin Sodium] Hives  . Hydrocodone Nausea And Vomiting    Hear racing & breaks out into a cold sweat.  . Meropenem Rash    Erythematous, hot, pruritic rash over arms, chest, back, abdomen, and face occurred at the  end of meropenem infusion on 02/22/18   Current Meds  Medication Sig  . acetaminophen (TYLENOL) 325 MG tablet Take 2 tablets (650 mg total) by mouth every 6 (six) hours as needed for mild pain or moderate pain (or Fever >/= 101).  Marland Kitchen ALPRAZolam (XANAX) 0.5 MG tablet Take 1 tablet (0.5 mg total) by mouth 2 (two) times daily as needed for anxiety.  . ARIPiprazole (ABILIFY) 5 MG tablet Take 1  tablet (5 mg total) by mouth daily.  Marland Kitchen ascorbic acid (VITAMIN C) 500 MG tablet Take 1 tablet (500 mg total) by mouth daily.  Marland Kitchen atorvastatin (LIPITOR) 80 MG tablet Take 80 mg by mouth daily.   . budesonide (PULMICORT) 0.5 MG/2ML nebulizer solution Take 2 mLs (0.5 mg total) by nebulization 2 (two) times daily.  . calcium citrate-vitamin D 500-500 MG-UNIT chewable tablet Chew 0.5 tablets by mouth daily.  . Cholecalciferol (VITAMIN D) 125 MCG (5000 UT) CAPS Take 1 capsule by mouth daily.  . famotidine (PEPCID) 20 MG tablet Take 1 tablet (20 mg total) by mouth daily.  Marland Kitchen FLUoxetine (PROZAC) 20 MG capsule TAKE (1) CAPSULE BY MOUTH ONCE DAILY. (Patient taking differently: Take 20 mg by mouth daily.)  . folic acid (FOLVITE) 1 MG tablet Take 1 mg by mouth daily.   . furosemide (LASIX) 20 MG tablet TAKE (1) TABLET BY MOUTH DAILY AS NEEDED.  Marland Kitchen insulin lispro (HUMALOG) 100 UNIT/ML injection Inject 0.08 mLs (8 Units total) into the skin 3 (three) times daily before meals.  Marland Kitchen ipratropium-albuterol (DUONEB) 0.5-2.5 (3) MG/3ML SOLN Take 3 mLs by nebulization 3 (three) times daily.  Marland Kitchen levothyroxine (SYNTHROID, LEVOTHROID) 88 MCG tablet Take 88 mcg by mouth daily before breakfast.  . metFORMIN (GLUCOPHAGE-XR) 500 MG 24 hr tablet Take 500 mg by mouth daily.  . methotrexate 2.5 MG tablet Take 10 mg by mouth once a week.   . mupirocin ointment (BACTROBAN) 2 % Apply 1 application topically 2 (two) times daily. Intranasal - to affected area.  . polyethylene glycol (MIRALAX / GLYCOLAX) 17 g packet Take 17 g by mouth daily as needed for mild constipation.  . potassium chloride SA (KLOR-CON) 20 MEQ tablet TAKE 1 TABLET BY MOUTH ONCE DAILY. (Patient taking differently: 10 mEq.)  . pramipexole (MIRAPEX) 0.125 MG tablet Take 0.25 mg by mouth at bedtime.   . predniSONE (DELTASONE) 10 MG tablet Take 10 mg by mouth daily.  . QUEtiapine (SEROQUEL) 300 MG tablet TAKE ONE TABLET BY MOUTH AT BEDTIME.  Marland Kitchen solifenacin (VESICARE) 5 MG  tablet Take 5 mg by mouth daily.  . TRADJENTA 5 MG TABS tablet Take 5 mg by mouth daily.  . vancomycin IVPB Inject 500 mg into the vein. Unknown dose.  Given at home via picc line  . zinc sulfate 220 (50 Zn) MG capsule Take 220 mg by mouth at bedtime.   Marland Kitchen zolpidem (AMBIEN) 5 MG tablet TAKE ONE TABLET BY MOUTH AT BEDTIME.    She is on cefepime as well as vancomycin via home infusion.  Immunization History  Administered Date(s) Administered  . Influenza, Seasonal, Injecte, Preservative Fre 02/04/2005  . Influenza,inj,Quad PF,6+ Mos 04/14/2015, 03/23/2017, 02/07/2018, 02/01/2019, 02/07/2020  . Influenza-Unspecified 02/02/2019  . PFIZER(Purple Top)SARS-COV-2 Vaccination 04/08/2020  . Pneumococcal Conjugate-13 04/22/2014  . Pneumococcal Polysaccharide-23 03/23/2017, 04/16/2019      Objective:   Physical Exam BP 118/68 (BP Location: Left Arm, Cuff Size: Normal)   Pulse 96   Wt (!) 320 lb (145.2 kg) Comment: weight is per patient  LMP 04/20/2001   SpO2 98%   BMI 51.65 kg/m On oxygen at 3 L/min. GENERAL: Morbidly obese woman, presents in transport chair.  No respiratory distress.  Comfortable with nasal cannula O2 oxygen on.  No conversational dyspnea. HEAD: Normocephalic, atraumatic.  EYES: Pupils equal, round, reactive to light.  No scleral icterus.  MOUTH: Nose/mouth/throat not examined due to masking requirements for COVID 19. NECK: Supple. No thyromegaly. Trachea midline. No JVD.  No adenopathy. PULMONARY: Good air entry bilaterally.  No adventitious sounds.  CARDIOVASCULAR: S1 and S2. Regular rate and rhythm.  No rubs, murmurs or gallops heard. ABDOMEN: Obese, soft, nondistended. MUSCULOSKELETAL: RA changes both hands.  No clubbing or edema noted, no increased warmth in the lower extremities.  Left foot in protective shoe and with dressing, not removed. NEUROLOGIC: No overt focal deficit, speech is fluent. SKIN: Intact,warm,dry.  Chronic stasis changes lower extremities.   PSYCH:  Flat affect.  No psychomotor retardation.      Assessment & Plan:     ICD-10-CM   1. Shortness of breath  R06.02 DG Chest 2 View    CBC w/Diff    Renal Function Panel    B Nat Peptide    D-dimer, quantitative (not at [facility])   Chronic issue for the patient. No acute distress Suspect multifactorial Will obtain chest x-ray and chemistries  2. Chronic respiratory failure with hypoxia (HCC)  J96.11    Oxygen at 2 L/min with rest, 3 to 4 L with exertion  3. Postinflammatory pulmonary fibrosis (HCC)  J84.10    Appears to be stable in this regard  4. Chronic diastolic CHF (congestive heart failure) (HCC)  I50.32    Check B natruretic peptide Check chest x-ray  5. Other osteomyelitis of left foot (HCC)  M86.8X7    This issue adds complexity to her management Status post left great toe amputation On home infusions of antibiotics per [facility]  6. Rheumatoid arthritis  M06.9    This issue adds complexity to her management Currently only on p.o. prednisone Off of methotrexate  7. Physical deconditioning  R53.81    This adds to her sensation of dyspnea Issue adds complexity to her management  8. Morbid obesity with BMI of 50.0-59.9, adult (HCC)  E66.01    Z68.43    This issue adds complexity to her management Adds to her sensation of dyspnea   Orders Placed This Encounter  Procedures  . DG Chest 2 View    Standing Status:   Future    Number of Occurrences:   1    Standing Expiration Date:   11/18/2020    Order Specific Question:   Reason for Exam (SYMPTOM  OR DIAGNOSIS REQUIRED)    Answer:   sob    Order Specific Question:   Preferred imaging location?    Answer:   [facility]    Order Specific Question:   Is the patient pregnant?    Answer:   No  . CBC w/Diff    Standing Status:   Future    Standing Expiration Date:   05/21/2021  . Renal Function Panel    Standing Status:   Future    Standing Expiration Date:   05/21/2021  . B Nat Peptide    Standing Status:   Future     Standing Expiration Date:   05/21/2021  . D-dimer, quantitative (not at [facility])    Standing Status:   Future    Standing Expiration Date:   05/21/2021   Discussion:  Status post a prolonged hospitalization at [facility] for severe sepsis with toxic metabolic encephalopathy due to osteomyelitis of the left foot.  She has reverted to needing oxygen 24/7.  Presented today thinking her follow-up appointment was today and with a nonfunctioning oxygen tank.  Oxygen saturations were noted to be 83% initially.  On supplemental oxygen these were 98%.  She has been instructed of how to titrate her O2 for ambulation and at rest.  She may use 2 L/min at rest and with sleeping and 3 to 4 L/min with ambulation.  Most recent chest x-ray report from [facility] from 16 January showed that she had elevation of the right hemidiaphragm, pulmonary vascular congestion consistent with congestive heart failure and chronic changes on the right base.  On exam today she does not have significant fluid accumulation.  Await laboratory data as above.  Further studies as indicated by laboratory data.  I have advised the patient to keep her appointment with our nurse practitioner Rubye Oaks on 8 February as previously scheduled.  She has been advised that if her shortness of breath worsens she should come to the emergency room.  Currently she is in no distress.   Total visit time 55 minutes.   Gailen Shelter, MD [facility]   *This note was dictated using voice recognition software/Dragon.  Despite best efforts to proofread, errors can occur which can change the meaning.  Any change was purely unintentional.

## 2020-05-21 NOTE — Patient Instructions (Addendum)
We are doing some blood work and a chest x-ray today.  Use your oxygen at 2 to 4 L/min.  2 L with rest and sleeping and 3-4 L when moving around.  Depending on what the blood work says we will give you instructions with regards to your fluid medication.  Please keep the appointment that you have on 8 February at 230 with our nurse practitioner so that we can ensure that you are doing well.  Keep your appointments with [facility].

## 2020-05-22 ENCOUNTER — Ambulatory Visit
Admission: RE | Admit: 2020-05-22 | Discharge: 2020-05-22 | Disposition: A | Discharge status: Home / Self Care | Payer: Medicaid Other | Source: Ambulatory Visit | Attending: Pulmonary Disease | Admitting: Pulmonary Disease

## 2020-05-22 ENCOUNTER — Encounter: Payer: Self-pay | Admitting: *Deleted

## 2020-05-22 ENCOUNTER — Telehealth: Payer: Self-pay | Admitting: Pulmonary Disease

## 2020-05-22 ENCOUNTER — Emergency Department
Admission: EM | Admit: 2020-05-22 | Discharge: 2020-05-22 | Disposition: A | Discharge status: Home / Self Care | Payer: Medicaid Other | Attending: Emergency Medicine | Admitting: Emergency Medicine

## 2020-05-22 ENCOUNTER — Other Ambulatory Visit: Payer: Self-pay

## 2020-05-22 DIAGNOSIS — G894 Chronic pain syndrome: Principal | ICD-10-CM

## 2020-05-22 DIAGNOSIS — E1369 Other specified diabetes mellitus with other specified complication: Principal | ICD-10-CM

## 2020-05-22 DIAGNOSIS — M0579 Rheumatoid arthritis with rheumatoid factor of multiple sites without organ or systems involvement: Principal | ICD-10-CM

## 2020-05-22 DIAGNOSIS — K219 Gastro-esophageal reflux disease without esophagitis: Principal | ICD-10-CM

## 2020-05-22 DIAGNOSIS — A411 Sepsis due to other specified staphylococcus: Principal | ICD-10-CM

## 2020-05-22 DIAGNOSIS — J9611 Chronic respiratory failure with hypoxia: Principal | ICD-10-CM

## 2020-05-22 DIAGNOSIS — E559 Vitamin D deficiency, unspecified: Principal | ICD-10-CM

## 2020-05-22 DIAGNOSIS — N3946 Mixed incontinence: Principal | ICD-10-CM

## 2020-05-22 DIAGNOSIS — F419 Anxiety disorder, unspecified: Principal | ICD-10-CM

## 2020-05-22 DIAGNOSIS — E13621 Other specified diabetes mellitus with foot ulcer: Principal | ICD-10-CM

## 2020-05-22 DIAGNOSIS — J449 Chronic obstructive pulmonary disease, unspecified: Principal | ICD-10-CM

## 2020-05-22 DIAGNOSIS — J8410 Pulmonary fibrosis, unspecified: Principal | ICD-10-CM

## 2020-05-22 DIAGNOSIS — A4102 Sepsis due to Methicillin resistant Staphylococcus aureus: Principal | ICD-10-CM

## 2020-05-22 DIAGNOSIS — L97528 Non-pressure chronic ulcer of other part of left foot with other specified severity: Principal | ICD-10-CM

## 2020-05-22 DIAGNOSIS — T8754 Necrosis of amputation stump, left lower extremity: Principal | ICD-10-CM

## 2020-05-22 DIAGNOSIS — E039 Hypothyroidism, unspecified: Principal | ICD-10-CM

## 2020-05-22 DIAGNOSIS — U099 Post covid-19 condition, unspecified: Principal | ICD-10-CM

## 2020-05-22 DIAGNOSIS — M86171 Other acute osteomyelitis, right ankle and foot: Principal | ICD-10-CM

## 2020-05-22 DIAGNOSIS — F319 Bipolar disorder, unspecified: Principal | ICD-10-CM

## 2020-05-22 DIAGNOSIS — I110 Hypertensive heart disease with heart failure: Principal | ICD-10-CM

## 2020-05-22 DIAGNOSIS — I5032 Chronic diastolic (congestive) heart failure: Principal | ICD-10-CM

## 2020-05-22 DIAGNOSIS — G4733 Obstructive sleep apnea (adult) (pediatric): Principal | ICD-10-CM

## 2020-05-22 DIAGNOSIS — Z452 Encounter for adjustment and management of vascular access device: Principal | ICD-10-CM

## 2020-05-22 DIAGNOSIS — E1342 Other specified diabetes mellitus with diabetic polyneuropathy: Principal | ICD-10-CM

## 2020-05-22 DIAGNOSIS — Z7951 Long term (current) use of inhaled steroids: Secondary | ICD-10-CM | POA: Insufficient documentation

## 2020-05-22 DIAGNOSIS — E11621 Type 2 diabetes mellitus with foot ulcer: Secondary | ICD-10-CM | POA: Diagnosis not present

## 2020-05-22 DIAGNOSIS — R0609 Other forms of dyspnea: Secondary | ICD-10-CM | POA: Diagnosis not present

## 2020-05-22 DIAGNOSIS — M25562 Pain in left knee: Secondary | ICD-10-CM | POA: Insufficient documentation

## 2020-05-22 DIAGNOSIS — R7989 Other specified abnormal findings of blood chemistry: Secondary | ICD-10-CM | POA: Diagnosis not present

## 2020-05-22 DIAGNOSIS — N182 Chronic kidney disease, stage 2 (mild): Secondary | ICD-10-CM | POA: Diagnosis not present

## 2020-05-22 DIAGNOSIS — L97509 Non-pressure chronic ulcer of other part of unspecified foot with unspecified severity: Secondary | ICD-10-CM | POA: Diagnosis not present

## 2020-05-22 DIAGNOSIS — E1169 Type 2 diabetes mellitus with other specified complication: Secondary | ICD-10-CM | POA: Insufficient documentation

## 2020-05-22 DIAGNOSIS — M25571 Pain in right ankle and joints of right foot: Secondary | ICD-10-CM | POA: Insufficient documentation

## 2020-05-22 DIAGNOSIS — Z7984 Long term (current) use of oral hypoglycemic drugs: Secondary | ICD-10-CM | POA: Diagnosis not present

## 2020-05-22 DIAGNOSIS — E1122 Type 2 diabetes mellitus with diabetic chronic kidney disease: Secondary | ICD-10-CM | POA: Diagnosis not present

## 2020-05-22 DIAGNOSIS — Z79899 Other long term (current) drug therapy: Secondary | ICD-10-CM | POA: Diagnosis not present

## 2020-05-22 DIAGNOSIS — R053 Chronic cough: Secondary | ICD-10-CM | POA: Insufficient documentation

## 2020-05-22 DIAGNOSIS — Z794 Long term (current) use of insulin: Secondary | ICD-10-CM | POA: Insufficient documentation

## 2020-05-22 DIAGNOSIS — I82411 Acute embolism and thrombosis of right femoral vein: Secondary | ICD-10-CM | POA: Diagnosis not present

## 2020-05-22 DIAGNOSIS — M25572 Pain in left ankle and joints of left foot: Secondary | ICD-10-CM | POA: Insufficient documentation

## 2020-05-22 DIAGNOSIS — Z87891 Personal history of nicotine dependence: Secondary | ICD-10-CM | POA: Insufficient documentation

## 2020-05-22 DIAGNOSIS — Z8616 Personal history of COVID-19: Secondary | ICD-10-CM | POA: Insufficient documentation

## 2020-05-22 DIAGNOSIS — I130 Hypertensive heart and chronic kidney disease with heart failure and stage 1 through stage 4 chronic kidney disease, or unspecified chronic kidney disease: Secondary | ICD-10-CM | POA: Diagnosis not present

## 2020-05-22 DIAGNOSIS — Z7901 Long term (current) use of anticoagulants: Secondary | ICD-10-CM | POA: Insufficient documentation

## 2020-05-22 DIAGNOSIS — M869 Osteomyelitis, unspecified: Secondary | ICD-10-CM | POA: Insufficient documentation

## 2020-05-22 DIAGNOSIS — R110 Nausea: Secondary | ICD-10-CM | POA: Diagnosis not present

## 2020-05-22 DIAGNOSIS — M25561 Pain in right knee: Secondary | ICD-10-CM | POA: Diagnosis present

## 2020-05-22 LAB — CBC WITH DIFFERENTIAL/PLATELET
Abs Immature Granulocytes: 0.08 10*3/uL — ABNORMAL HIGH (ref 0.00–0.07)
Basophils Absolute: 0.1 10*3/uL (ref 0.0–0.1)
Basophils Relative: 1 %
Eosinophils Absolute: 0 10*3/uL (ref 0.0–0.5)
Eosinophils Relative: 0 %
HCT: 34.5 % — ABNORMAL LOW (ref 36.0–46.0)
Hemoglobin: 9.7 g/dL — ABNORMAL LOW (ref 12.0–15.0)
Immature Granulocytes: 1 %
Lymphocytes Relative: 13 %
Lymphs Abs: 1.2 10*3/uL (ref 0.7–4.0)
MCH: 20.6 pg — ABNORMAL LOW (ref 26.0–34.0)
MCHC: 28.1 g/dL — ABNORMAL LOW (ref 30.0–36.0)
MCV: 73.4 fL — ABNORMAL LOW (ref 80.0–100.0)
Monocytes Absolute: 0.7 10*3/uL (ref 0.1–1.0)
Monocytes Relative: 7 %
Neutro Abs: 7.5 10*3/uL (ref 1.7–7.7)
Neutrophils Relative %: 78 %
Platelets: 231 10*3/uL (ref 150–400)
RBC: 4.7 MIL/uL (ref 3.87–5.11)
RDW: 23.3 % — ABNORMAL HIGH (ref 11.5–15.5)
Smear Review: NORMAL
WBC: 9.6 10*3/uL (ref 4.0–10.5)
nRBC: 0 % (ref 0.0–0.2)

## 2020-05-22 LAB — BASIC METABOLIC PANEL
Anion gap: 13 (ref 5–15)
BUN: 14 mg/dL (ref 6–20)
CO2: 29 mmol/L (ref 22–32)
Calcium: 9 mg/dL (ref 8.9–10.3)
Chloride: 97 mmol/L — ABNORMAL LOW (ref 98–111)
Creatinine, Ser: 0.71 mg/dL (ref 0.44–1.00)
GFR, Estimated: >60 mL/min (ref >60)
Glucose, Bld: 184 mg/dL — ABNORMAL HIGH (ref 70–99)
Potassium: 3.5 mmol/L (ref 3.5–5.1)
Sodium: 139 mmol/L (ref 135–145)

## 2020-05-22 MED ORDER — APIXABAN 5 MG PO TABS: ORAL_TABLET | ORAL | 0 refills | Status: DC

## 2020-05-22 MED ORDER — IOHEXOL 350 MG/ML SOLN
100.0000 mL | Freq: Once | INTRAVENOUS | Status: AC | PRN
  Administered 2020-05-22: 100 mL via INTRAVENOUS

## 2020-05-22 MED ORDER — APIXABAN 5 MG TABLET
Freq: Two times a day (BID) | ORAL | 0.00000 days | Status: SS
Start: 2020-05-22 — End: ?

## 2020-05-22 MED ORDER — ELIQUIS 5 MG TABLET
0 days
Start: 2020-05-22 — End: 2020-05-26

## 2020-05-22 NOTE — ED Provider Notes (Signed)
[facility] Emergency Department Provider Note  ____________________________________________   Event Date/Time   First MD Initiated Contact with Patient 05/22/20 1301     (approximate)  I have reviewed the triage vital signs and the nursing notes.   HISTORY  Chief Complaint DVT   HPI [name] [name] is a 60 y.o. female past medical history of chronic hypoxic respiratory failure on 3 L since Covid infection in December 2020, RA, CHF, HTN, IBS, hypothyroidism, morbid obesity, OSA, GERD, anxiety, arthritis, DM, depression and recent extended hospitalization at [facility] for sepsis and toxic metabolic encephalopathy secondary to osteomyelitis in her left foot who presents after being referred to the ED for further evaluation after right lower quadrant ultrasound obtained was concerning for DVT.  CTA DVT study obtained after outpatient visit yesterday with Dr. Jayme Cloud patient's pulmonologist had evidence of elevated D-dimer.  Patient states she has multiple chronic symptoms including chronic nausea, shortness of breath with exertion, pain and soreness in her bilateral lower extremities including at the bilateral knees and ankles as well as chronic cough and some nausea.  No recent falls or injuries.  No acute headache, chest pain, change in her chronic shortness of breath or other clear acute sick symptoms.  She denies any urinary symptoms.  She notes her wound has been healing well on her left foot after recent debridement at [facility].  She is never had a blood clot before.  No other acute concerns at this time.         Past Medical History:  Diagnosis Date  . Abdominal wall hernia 01/29/2013  . Anxiety   . Arthritis    Rheumatoid  . C. difficile colitis   . Chronic diastolic heart failure (HCC)   . COVID-19 03/23/2019   Diagnosed at [facility] (send-out) on 03/23/2019  . Depression   . Diabetes mellitus    states no meds or diet restrictions  at present  . Diastolic CHF  (HCC)   . Esophagitis   . Fluid retention   . GERD (gastroesophageal reflux disease)   . Hiatal hernia   . Hypertension   . Hypokalemia due to loss of potassium 10/21/2015   Overview:  Associated with 3 weeks of diarrhea  And QT prolongation.  . Hypothyroidism   . IBS (irritable bowel syndrome)   . Moderate episode of recurrent major depressive disorder (HCC) 06/03/2004  . Morbid obesity (HCC)   . MRSA (methicillin resistant Staphylococcus aureus) infection 11/2017   left inner thigh abcess  . Neurogenic bladder    has pacemaker  . Neuropathy   . Obesity   . Panic attacks   . Pneumonia due to COVID-19 virus   . Rheumatoid arthritis (HCC)   . Sleep apnea    STATES SEVERE, CANT TOLERATE MASK- LAST STUDY YEARS AGO    Patient Active Problem List   Diagnosis Date Noted  . 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 Chronic respiratory failure with hypoxia (HCC) 02/07/2020  . Anemia 02/07/2020  . Lower extremity cellulitis 01/10/2020  . Bacteremia   . Paroxysmal A-fib (HCC)   . Right arm weakness   . Ileus (HCC)   . Splenic infarct   . Acute metabolic encephalopathy 07/08/2019  . Hyperglycemia due to type 2 diabetes mellitus (HCC) 07/08/2019  . Morbid obesity with BMI of 50.0-59.9, adult (HCC) 07/08/2019  . Severe sepsis with septic shock (HCC) 07/08/2019  . Septic shock (HCC) 07/08/2019  . History of severe acute respiratory syndrome coronavirus 2 (SARS-CoV-2) disease  05/24/2019  . Acute respiratory disease due to COVID-19 virus 04/06/2019  . Type 2 diabetes mellitus without complication, without long-term current use of insulin (HCC) 04/06/2019  . CKD (chronic kidney disease), stage III (HCC) 04/06/2019  . HLD (hyperlipidemia) 04/06/2019  . COVID-19 03/27/2019  . Pneumonia due to COVID-19 virus 03/27/2019  . Abdominal pain 02/12/2019  . AKI (acute kidney injury) (HCC) 02/12/2019  . Cellulitis, abdominal wall 02/04/2019  . Elevated C-reactive protein (CRP)  12/15/2018  . Elevated sed rate 12/15/2018  . Pharmacologic therapy 11/06/2018  . Disorder of skeletal system 11/06/2018  . Problems influencing health status 11/06/2018  . Decubitus ulcer of heel, bilateral 02/21/2018  . Diabetic ulcer of toe of right foot associated with type 2 diabetes mellitus (HCC) 02/14/2018  . Ulcer of left heel and midfoot with fat layer exposed (HCC) 02/14/2018  . Sepsis due to methicillin resistant Staphylococcus aureus (MRSA) without acute organ dysfunction (HCC) 11/17/2017  . MRSA (methicillin resistant Staphylococcus aureus) infection 11/17/2017  . Cellulitis 11/15/2017  . Perineal abscess 11/15/2017  . Subacute vulvitis 11/01/2017  . Chronic cystitis 03/01/2017  . Chronic pain syndrome 03/31/2016  . Insomnia secondary to chronic pain 03/31/2016  . Chronic upper back pain 12/25/2015  . Chronic hand pain (Bilateral) (L>R) 12/25/2015  . Rheumatoid arthritis (HCC) 12/25/2015  . Osteoarthritis, multiple sites 12/24/2015  . Chronic foot pain (Primary Area of Pain) (Bilateral) (L>R) 12/24/2015  . Chronic elbow pain (Third area of Pain) (Bilateral) (L>R) 12/24/2015  . Chronic shoulder pain (Bilateral) (L>R) 12/24/2015  . Chronic neck pain (Bilateral) (R>L) 12/24/2015  . 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[employer]) 12/23/2015  . Chronic knee pain (Bilateral) (R>L) 12/23/2015  . Long term current use of opiate analgesic 12/23/2015  . Long term prescription opiate use 12/23/2015  . Opiate use (30 MME/Day) 12/23/2015  . Neurogenic pain 12/23/2015  . Neuropathic pain 12/23/2015  . Diabetic peripheral neuropathy (HCC) 12/23/2015  . Encounter for therapeutic drug level monitoring 12/23/2015  . Encounter for pain management planning 12/23/2015  . GERD (gastroesophageal reflux disease) 11/25/2015  . Neuropathy 11/25/2015  . Hypokalemia due to loss of potassium 10/21/2015  . Hypomagnesemia 10/21/2015  . QT prolongation 10/21/2015  . Osteomyelitis  due to type 2 diabetes mellitus (HCC) 10/21/2015  . Chronic wrist pain (Secondary area of Pain) (Bilateral) (L>R) 03/19/2015  . 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 Female genuine stress incontinence 02/14/2015  . Urge incontinence of urine 02/14/2015  . OSA (obstructive sleep apnea) 07/03/2014  . Chronic diastolic CHF (congestive heart failure) (HCC) 07/03/2014  . Anxiety 01/03/2014  . Diabetic ulcer of heel (HCC) 01/03/2014  . COPD (chronic obstructive pulmonary disease) (HCC) 01/03/2014  . Bipolar disorder, unspecified (HCC) 01/03/2014  . Diastolic dysfunction 01/03/2014  . Combined fat and carbohydrate induced hyperlipemia 01/03/2014  . Shortness of breath 01/03/2014  . Acute on chronic congestive heart failure (HCC) 01/03/2014  . Incomplete bladder emptying 11/02/2012  . Bladder retention 10/10/2012  . Detrusor muscle hypertonia 10/04/2012  . Obstruction of urinary tract 10/04/2012  . FOM (frequency of micturition) 10/04/2012  . Mixed incontinence 10/04/2012  . Vitamin D insufficiency 04/15/2012  . Borderline personality disorder (HCC) 01/06/2012  . Anxiety and depression 01/06/2012  . History of laparoscopic adjustable gastric banding, 03/20/2007.  Removed 09/19/2011. 08/04/2011  . Nausea & vomiting 08/04/2011  . Hypothyroidism 06/28/2010  . Rheumatoid arthritis involving multiple sites with positive rheumatoid factor (HCC) 06/28/2010  . Obesity 03/29/2005  . Essential (primary) hypertension 01/27/2005  . Major depressive disorder, recurrent  episode, moderate (HCC) 06/03/2004    Past Surgical History:  Procedure Laterality Date  . ABDOMINAL HYSTERECTOMY    . CHOLECYSTECTOMY    . DG GREAT TOE RIGHT FOOT  02/23/2018  . EYE SURGERY     bilateral cataract extraction with IOL  . HERNIA REPAIR     ventral hernia with strangulation  . IRRIGATION AND DEBRIDEMENT FOOT Left 01/11/2020   Procedure: IRRIGATION AND DEBRIDEMENT FOOT;  Surgeon: Gwyneth Revels, DPM;  Location: [facility] ORS;   Service: Podiatry;  Laterality: Left;  . LAPAROSCOPIC GASTRIC BANDING  03/20/07  . TEE WITHOUT CARDIOVERSION N/A 07/16/2019   Procedure: TRANSESOPHAGEAL ECHOCARDIOGRAM (TEE);  Surgeon: Debbe Odea, MD;  Location: [facility] ORS;  Service: Cardiovascular;  Laterality: N/A;  . TONSILLECTOMY    . TUBAL LIGATION      Prior to Admission medications   Medication Sig Start Date End Date Taking? Authorizing Provider  apixaban (ELIQUIS) 5 MG TABS tablet Take 2 tablets ( ) twice daily for 7 days, then 1 tablet ( ) twice daily 05/22/20  Yes Gilles Chiquito, MD  acetaminophen (TYLENOL) 325 MG tablet Take 2 tablets (650 mg total) by mouth every 6 (six) hours as needed for mild pain or moderate pain (or Fever >/= 101). 01/15/20   Sheikh, Kateri Mc Latif, DO  ALPRAZolam Prudy Feeler) 0.5 MG tablet Take 1 tablet (0.5 mg total) by mouth 2 (two) times daily as needed for anxiety. 07/17/19   Alford Highland, MD  ARIPiprazole (ABILIFY) 5 MG tablet Take 1 tablet (5 mg total) by mouth daily. 03/13/20   Clapacs, Jackquline Denmark, MD  ascorbic acid (VITAMIN C) 500 MG tablet Take 1 tablet (500 mg total) by mouth daily. 05/05/19   Rolly Salter, MD  atorvastatin (LIPITOR) 80 MG tablet Take 80 mg by mouth daily.  12/05/19 12/04/20  [provider]  budesonide (PULMICORT) 0.5 MG/2ML nebulizer solution Take 2 mLs (0.5 mg total) by nebulization 2 (two) times daily. 06/04/19 06/03/20  Salena Saner, MD  calcium citrate-vitamin D 500-500 MG-UNIT chewable tablet Chew 0.5 tablets by mouth daily. 01/16/20   Marguerita Merles Latif, DO  Cholecalciferol (VITAMIN D) 125 MCG (5000 UT) CAPS Take 1 capsule by mouth daily. 11/29/19   [provider]  famotidine (PEPCID) 20 MG tablet Take 1 tablet (20 mg total) by mouth daily. 05/05/19   Rolly Salter, MD  FLUoxetine (PROZAC) 20 MG capsule TAKE (1) CAPSULE BY MOUTH ONCE DAILY. Patient taking differently: Take 20 mg by mouth daily. 12/06/19   Clapacs, Jackquline Denmark, MD  folic acid (FOLVITE) 1 MG  tablet Take 1 mg by mouth daily.     [provider]  furosemide (LASIX) 20 MG tablet TAKE (1) TABLET BY MOUTH DAILY AS NEEDED. 09/05/19   Parrett, Tammy S, NP  insulin lispro (HUMALOG) 100 UNIT/ML injection Inject 0.08 mLs (8 Units total) into the skin 3 (three) times daily before meals. 07/17/19   Wieting, Richard, MD  ipratropium-albuterol (DUONEB) 0.5-2.5 (3) MG/3ML SOLN Take 3 mLs by nebulization 3 (three) times daily. 05/04/19   Rolly Salter, MD  levothyroxine (SYNTHROID, LEVOTHROID) 88 MCG tablet Take 88 mcg by mouth daily before breakfast.    [provider]  metFORMIN (GLUCOPHAGE-XR) 500 MG 24 hr tablet Take 500 mg by mouth daily. 12/31/19   [provider]  methotrexate 2.5 MG tablet Take 10 mg by mouth once a week.  12/31/19   [provider]  mupirocin ointment (BACTROBAN) 2 % Apply 1 application topically 2 (two) times daily.  Intranasal - to affected area. 11/28/19   Tommie Sams, DO  polyethylene glycol (MIRALAX / GLYCOLAX) 17 g packet Take 17 g by mouth daily as needed for mild constipation. 05/04/19   Rolly Salter, MD  potassium chloride SA (KLOR-CON) 20 MEQ tablet TAKE 1 TABLET BY MOUTH ONCE DAILY. Patient taking differently: 10 mEq. 09/05/19   Parrett, Virgel Bouquet, NP  pramipexole (MIRAPEX) 0.125 MG tablet Take 0.25 mg by mouth at bedtime.     [provider]  predniSONE (DELTASONE) 10 MG tablet Take 10 mg by mouth daily. 12/31/19   [provider]  pregabalin (LYRICA) 150 MG capsule Take 1 capsule (150 mg total) by mouth 3 (three) times daily. 11/19/19 05/17/20  Delano Metz, MD  QUEtiapine (SEROQUEL) 300 MG tablet TAKE ONE TABLET BY MOUTH AT BEDTIME. 01/03/20   Clapacs, Jackquline Denmark, MD  solifenacin (VESICARE) 5 MG tablet Take 5 mg by mouth daily. 12/31/19   [provider]  TRADJENTA 5 MG TABS tablet Take 5 mg by mouth daily. 12/31/19   [provider]  vancomycin IVPB Inject 500 mg into the vein. Unknown dose.  Given  at home via picc line    [provider]  zinc sulfate 220 (50 Zn) MG capsule Take 220 mg by mouth at bedtime.  12/31/19   [provider]  zolpidem (AMBIEN) 5 MG tablet TAKE ONE TABLET BY MOUTH AT BEDTIME. 11/20/19   Clapacs, Jackquline Denmark, MD    Allergies Cephalexin, Codeine, Doxycycline, Propoxyphene, Sulfa antibiotics, Clindamycin, Lovenox [enoxaparin sodium], Hydrocodone, and Meropenem  Family History  Problem Relation Age of Onset  . Heart failure Father   . Bipolar disorder Father   . Alcohol abuse Father   . Anxiety disorder Father   . Depression Father   . Heart disease Brother   . Heart attack Brother 72       MI s/p stents placed  . Anxiety disorder Sister   . Depression Sister   . Anxiety disorder Sister   . Depression Sister   . Bipolar disorder Sister   . Alcohol abuse Sister   . Drug abuse Sister   . Heart attack Brother     Social History Social History   Tobacco Use  . Smoking status: Former Smoker    Packs/day: 2.00    Years: 27.00    Pack years: 54.00    Types: Cigarettes    Quit date: 07/30/1999    Years since quitting: 20.8  . Smokeless tobacco: Never Used  Vaping Use  . Vaping Use: Never used  Substance Use Topics  . Alcohol use: No  . Drug use: No    Review of Systems  Review of Systems  Constitutional: Positive for malaise/fatigue ( chronic). Negative for chills and fever.  HENT: Negative for sore throat.   Eyes: Negative for pain.  Respiratory: Positive for cough ( chronic) and shortness of breath ( chronic). Negative for stridor.   Cardiovascular: Negative for chest pain.  Gastrointestinal: Positive for nausea ( chronic). Negative for vomiting.  Genitourinary: Negative for dysuria.  Musculoskeletal: Negative for myalgias.  Skin: Negative for rash.  Neurological: Positive for dizziness and weakness ( chronic). Negative for seizures, loss of consciousness and headaches.  Psychiatric/Behavioral: Negative for suicidal ideas.   All other systems reviewed and are negative.     ____________________________________________   PHYSICAL EXAM:  VITAL SIGNS: ED Triage Vitals  Enc Vitals Group     BP 05/22/20 1248 (!) 141/112  Pulse Rate 05/22/20 1248 99     Resp 05/22/20 1248 18     Temp 05/22/20 1248 (!) 97.5 F (36.4 C)     Temp Source 05/22/20 1248 Oral     SpO2 05/22/20 1248 93 %     Weight 05/22/20 1249 (!) 320 lb (145.2 kg)     Height --      Head Circumference --      Peak Flow --      Pain Score --      Pain Loc --      Pain Edu? --      Excl. in GC? --    Vitals:   05/22/20 1248  BP: (!) 141/112  Pulse: 99  Resp: 18  Temp: (!) 97.5 F (36.4 C)  SpO2: 93%   Physical Exam Vitals and nursing note reviewed.  Constitutional:      General: She is not in acute distress.    Appearance: She is well-developed and well-nourished. She is obese. Ill appearance:  chronically ill appearing.  HENT:     Head: Normocephalic and atraumatic.     Right Ear: External ear normal.     Left Ear: External ear normal.  Eyes:     Conjunctiva/sclera: Conjunctivae normal.  Cardiovascular:     Rate and Rhythm: Normal rate and regular rhythm.     Pulses: Normal pulses.     Heart sounds: No murmur heard.   Pulmonary:     Effort: Pulmonary effort is normal. No respiratory distress.     Breath sounds: Normal breath sounds.  Abdominal:     Palpations: Abdomen is soft.     Tenderness: There is no abdominal tenderness.  Musculoskeletal:        General: No edema.     Cervical back: Neck supple.  Skin:    General: Skin is warm and dry.  Neurological:     Mental Status: She is alert.  Psychiatric:        Mood and Affect: Mood and affect normal.     Wound over the plantar aspect of patient's left first toe appears clean dry and intact with well-healing margins and granulation tissue.  No evidence of surrounding cellulitis.  2+ bilateral radial DP pulses.  Patient has some tenderness behind the right knee  there is no dislocation or large effusion.  No significant streaking redness erythema or other abnormalities within the lower extremities.  Lungs clear bilaterally ____________________________________________   LABS (all labs ordered are listed, but only abnormal results are displayed)  Labs Reviewed  BASIC METABOLIC PANEL - Abnormal; Notable for the following components:      Result Value   Chloride 97 (*)    Glucose, Bld 184 (*)    All other components within normal limits  CBC WITH DIFFERENTIAL/PLATELET - Abnormal; Notable for the following components:   Hemoglobin 9.7 (*)    HCT 34.5 (*)    MCV 73.4 (*)    MCH 20.6 (*)    MCHC 28.1 (*)    RDW 23.3 (*)    Abs Immature Granulocytes 0.08 (*)    All other components within normal limits   ____________________________________________   ____________________________________________  RADIOLOGY  ED MD interpretation: Chest x-ray obtained today shows small right pleural effusion and elevation of the right hemidiaphragm.  No other clear acute intrathoracic process.  CTA shows no evidence of pneumonia or PE.  DVT of the right lower extremity shows DVT at the popliteal vein.  Official radiology report(s): DG  Chest 2 View  Result Date: 05/22/2020 CLINICAL DATA:  Worsening shortness of breath, CHF, diabetes mellitus, had COVID-19 in December 2020, former smoker EXAM: CHEST - 2 VIEW COMPARISON:  08/02/2019 FINDINGS: New elevation of RIGHT diaphragm. RIGHT arm PICC line tip projects over SVC. Normal heart size, mediastinal contours, and pulmonary vascularity. RIGHT basilar atelectasis and small RIGHT pleural effusion. LEFT lung clear. No definite infiltrate or pneumothorax. Osseous structures unremarkable. IMPRESSION: New elevation of RIGHT diaphragm with RIGHT basilar atelectasis and small RIGHT pleural effusion. Electronically Signed   By: Ulyses Southward M.D.   On: 05/22/2020 09:03   CT ANGIO CHEST PE W OR WO CONTRAST  Result Date:  05/22/2020 CLINICAL DATA:  Shortness of breath, positive D-dimer, recent COVID, high prob PE EXAM: CT ANGIOGRAPHY CHEST WITH CONTRAST TECHNIQUE: Multidetector CT imaging of the chest was performed using the standard protocol during bolus administration of intravenous contrast. Multiplanar CT image reconstructions and MIPs were obtained to evaluate the vascular anatomy. CONTRAST:  OMNIPAQUE IOHEXOL 350 MG/ML SOLN COMPARISON:  02/19/2020 and previous FINDINGS: Cardiovascular: Right arm central venous catheter extends to the cavoatrial junction. Heart size normal. Trace pericardial fluid. The RV is nondilated. Fair contrast opacification of pulmonary arterial branches. No convincing filling defects to suggest acute PE. Adequate contrast opacification of the thoracic aorta with no evidence of dissection, aneurysm, or stenosis. There is classic 3-vessel brachiocephalic arch anatomy without proximal stenosis. Mediastinum/Nodes: No mass or adenopathy. Lungs/Pleura: No pleural effusion. No pneumothorax. New consolidation/atelectasis in the lateral basal and posterior basal segments right lower lobe. Chronic coarse linear scarring or atelectasis in both upper lobes peripherally. Upper Abdomen: Surgical clips around the GE junction. Cholecystectomy clips. No acute findings. Musculoskeletal: No chest wall abnormality. No acute or significant osseous findings. Review of the MIP images confirms the above findings. IMPRESSION: 1. Negative for acute PE or thoracic aortic dissection. 2. Consolidation/atelectasis in the lateral basal and posterior basal segments of the right lower lobe, new since 02/19/2020. Electronically Signed   By: Corlis Leak M.D.   On: 05/22/2020 11:42   US Venous Img Lower Bilateral (DVT)  Result Date: 05/22/2020 CLINICAL DATA:  Edema, positive D-dimer EXAM: BILATERAL LOWER EXTREMITY VENOUS DOPPLER ULTRASOUND TECHNIQUE: Gray-scale sonography with compression, as well as color and duplex ultrasound,  were performed to evaluate the deep venous system(s) from the level of the common femoral vein through the popliteal and proximal calf veins. COMPARISON:  01/10/2020 FINDINGS: VENOUS On the left, normal compressibility of the common femoral, superficial femoral, and popliteal veins, as well as the visualized calf veins. Visualized portions of profunda femoral vein and great saphenous vein unremarkable. No filling defects to suggest DVT on grayscale or color Doppler imaging. Doppler waveforms show normal direction of venous flow, normal respiratory phasicity and response to augmentation. On the right, eccentric mural hypoechoic thrombus in the popliteal vein with incomplete compressibility, some continued flow signal on color Doppler. The calf veins are unremarkable. The femoral and common femoral veins remain patent. OTHER None. Limitations: none IMPRESSION: 1. POSITIVE for nonocclusive DVT limited to the right popliteal vein, age indeterminate. 2. Negative for left lower extremity DVT. Electronically Signed   By: Corlis Leak M.D.   On: 05/22/2020 12:14    ____________________________________________   PROCEDURES  Procedure(s) performed (including Critical Care):  .1-3 Lead EKG Interpretation Performed by: Gilles Chiquito, MD Authorized by: Gilles Chiquito, MD     Interpretation: normal     ECG rate assessment: normal     Rhythm:  sinus rhythm     Ectopy: none     Conduction: normal       ____________________________________________   INITIAL IMPRESSION / ASSESSMENT AND PLAN / ED COURSE      Patient presents with above to history exam after being for to the ED after outpatient ultrasound showed evidence of DVT.  This was drained after patient was noted have an elevated D-dimer yesterday.  She had a CTA done as well that showed no evidence of PE.  She is afebrile and hemodynamically stable on her 3 L.  She has no other acute complaints.  I reviewed her imaging studies obtained today  including ultrasound as noted above.  Also reviewed her CTA that showed no evidence of PE pneumonia or other clear acute thoracic process.  BMP today shows no significant electrolyte or metabolic derangements.  CBC shows no leukocytosis and hemoglobin at baseline at 9.7 compared to 10.21-day ago and 8.24 months ago.  Do believe it safe for patient to start DOAC at this time.  Rx written for Eliquis.  Will have patient follow-up with her PCP.  Discharge stable condition.  Strict return cautions advised and discussed.   ____________________________________________   FINAL CLINICAL IMPRESSION(S) / ED DIAGNOSES  Final diagnoses:  Acute deep vein thrombosis (DVT) of femoral vein of right lower extremity (HCC)    Medications - No data to display   ED Discharge Orders         Ordered    apixaban (ELIQUIS) 5 MG TABS tablet        05/22/20 1355           Note:  This document was prepared using Dragon voice recognition software and may include unintentional dictation errors.   Gilles Chiquito, MD 05/22/20 [phone]

## 2020-05-22 NOTE — Telephone Encounter (Signed)
Received call report from [place] ultrasound on patient's  doppler done on 05/22/20. LG please review the result/impression copied below: Called spoke with [facility] and Dr. Reece Agar said to send patient to ED Called ultrasound back and let them know the message   IMPRESSION: 1. POSITIVE for nonocclusive DVT limited to the right popliteal vein, age indeterminate. 2. Negative for left lower extremity DVT.    Please advise, thank you.

## 2020-05-22 NOTE — ED Triage Notes (Signed)
Pt had outpatient doppler that foind DVT and pt was sent here for further evaluation

## 2020-05-26 ENCOUNTER — Ambulatory Visit: Admit: 2020-05-26 | Discharge: 2020-05-26 | Payer: MEDICAID

## 2020-05-26 ENCOUNTER — Other Ambulatory Visit: Admit: 2020-05-26 | Discharge: 2020-05-26 | Payer: MEDICAID

## 2020-05-26 ENCOUNTER — Ambulatory Visit: Admit: 2020-05-26 | Discharge: 2020-05-26 | Payer: MEDICAID | Attending: Rheumatology | Primary: Rheumatology

## 2020-05-26 DIAGNOSIS — J449 Chronic obstructive pulmonary disease, unspecified: Principal | ICD-10-CM

## 2020-05-26 DIAGNOSIS — A4102 Sepsis due to Methicillin resistant Staphylococcus aureus: Principal | ICD-10-CM

## 2020-05-26 DIAGNOSIS — M0579 Rheumatoid arthritis with rheumatoid factor of multiple sites without organ or systems involvement: Principal | ICD-10-CM

## 2020-05-26 DIAGNOSIS — M79609 Pain in unspecified limb: Principal | ICD-10-CM

## 2020-05-26 DIAGNOSIS — M869 Osteomyelitis, unspecified: Principal | ICD-10-CM

## 2020-05-26 DIAGNOSIS — A411 Sepsis due to other specified staphylococcus: Principal | ICD-10-CM

## 2020-05-26 DIAGNOSIS — F419 Anxiety disorder, unspecified: Principal | ICD-10-CM

## 2020-05-26 DIAGNOSIS — J8410 Pulmonary fibrosis, unspecified: Principal | ICD-10-CM

## 2020-05-26 DIAGNOSIS — E13621 Other specified diabetes mellitus with foot ulcer: Principal | ICD-10-CM

## 2020-05-26 DIAGNOSIS — J9611 Chronic respiratory failure with hypoxia: Principal | ICD-10-CM

## 2020-05-26 DIAGNOSIS — F319 Bipolar disorder, unspecified: Principal | ICD-10-CM

## 2020-05-26 DIAGNOSIS — T8789 Other complications of amputation stump: Principal | ICD-10-CM

## 2020-05-26 DIAGNOSIS — N3946 Mixed incontinence: Principal | ICD-10-CM

## 2020-05-26 DIAGNOSIS — K219 Gastro-esophageal reflux disease without esophagitis: Principal | ICD-10-CM

## 2020-05-26 DIAGNOSIS — Z452 Encounter for adjustment and management of vascular access device: Principal | ICD-10-CM

## 2020-05-26 DIAGNOSIS — G894 Chronic pain syndrome: Principal | ICD-10-CM

## 2020-05-26 DIAGNOSIS — U099 Post covid-19 condition, unspecified: Principal | ICD-10-CM

## 2020-05-26 DIAGNOSIS — E039 Hypothyroidism, unspecified: Principal | ICD-10-CM

## 2020-05-26 DIAGNOSIS — M86171 Other acute osteomyelitis, right ankle and foot: Principal | ICD-10-CM

## 2020-05-26 DIAGNOSIS — I110 Hypertensive heart disease with heart failure: Principal | ICD-10-CM

## 2020-05-26 DIAGNOSIS — M069 Rheumatoid arthritis, unspecified: Principal | ICD-10-CM

## 2020-05-26 DIAGNOSIS — L97528 Non-pressure chronic ulcer of other part of left foot with other specified severity: Principal | ICD-10-CM

## 2020-05-26 DIAGNOSIS — E1369 Other specified diabetes mellitus with other specified complication: Principal | ICD-10-CM

## 2020-05-26 DIAGNOSIS — E559 Vitamin D deficiency, unspecified: Principal | ICD-10-CM

## 2020-05-26 DIAGNOSIS — G4733 Obstructive sleep apnea (adult) (pediatric): Principal | ICD-10-CM

## 2020-05-26 DIAGNOSIS — I5032 Chronic diastolic (congestive) heart failure: Principal | ICD-10-CM

## 2020-05-26 DIAGNOSIS — Z6841 Body Mass Index (BMI) 40.0 and over, adult: Principal | ICD-10-CM

## 2020-05-26 DIAGNOSIS — E1342 Other specified diabetes mellitus with diabetic polyneuropathy: Principal | ICD-10-CM

## 2020-05-26 DIAGNOSIS — T8754 Necrosis of amputation stump, left lower extremity: Principal | ICD-10-CM

## 2020-05-26 MED ORDER — HYDROXYCHLOROQUINE 200 MG TABLET
ORAL_TABLET | Freq: Two times a day (BID) | ORAL | 3 refills | 90.00000 days | Status: CP
Start: 2020-05-26 — End: 2021-05-26

## 2020-05-27 ENCOUNTER — Other Ambulatory Visit: Payer: Self-pay

## 2020-05-27 ENCOUNTER — Encounter: Payer: Self-pay | Admitting: Adult Health

## 2020-05-27 ENCOUNTER — Ambulatory Visit (INDEPENDENT_AMBULATORY_CARE_PROVIDER_SITE_OTHER): Payer: Medicaid Other | Admitting: Adult Health

## 2020-05-27 VITALS — BP 122/74 | HR 81 | Temp 97.3°F | Ht 66.0 in | Wt 320.0 lb

## 2020-05-27 DIAGNOSIS — J8410 Pulmonary fibrosis, unspecified: Secondary | ICD-10-CM | POA: Diagnosis not present

## 2020-05-27 DIAGNOSIS — I82439 Acute embolism and thrombosis of unspecified popliteal vein: Secondary | ICD-10-CM | POA: Insufficient documentation

## 2020-05-27 DIAGNOSIS — O2230 Deep phlebothrombosis in pregnancy, unspecified trimester: Secondary | ICD-10-CM | POA: Insufficient documentation

## 2020-05-27 DIAGNOSIS — J9611 Chronic respiratory failure with hypoxia: Secondary | ICD-10-CM

## 2020-05-27 DIAGNOSIS — J42 Unspecified chronic bronchitis: Secondary | ICD-10-CM | POA: Diagnosis not present

## 2020-05-27 DIAGNOSIS — I82401 Acute embolism and thrombosis of unspecified deep veins of right lower extremity: Secondary | ICD-10-CM

## 2020-05-27 DIAGNOSIS — I5032 Chronic diastolic (congestive) heart failure: Secondary | ICD-10-CM | POA: Diagnosis not present

## 2020-05-27 MED ORDER — APIXABAN 5 MG PO TABS: 5.0000 mg | ORAL_TABLET | Freq: Two times a day (BID) | ORAL | 3 refills | Status: DC

## 2020-05-27 MED ORDER — BUDESONIDE 0.5 MG/2ML IN SUSP: 0.5000 mg | Freq: Two times a day (BID) | RESPIRATORY_TRACT | 11 refills | Status: DC

## 2020-05-27 MED ORDER — BUDESONIDE 0.5 MG/2 ML SUSPENSION FOR NEBULIZATION
Freq: Two times a day (BID) | RESPIRATORY_TRACT | 0.00000 days | Status: SS
Start: 2020-05-27 — End: 2021-05-27

## 2020-05-27 NOTE — Patient Instructions (Addendum)
Pulmicort nebulizer twice daily DuoNeb nebulizer twice daily  Mucinex DM As needed  Cough /congestion  Continue on Oxygen 3l/m  Activity as tolerated Continue on Eliquis 5mg  Twice daily  .  Venous Doppler of right leg in 3 months.  Follow up with Dr. in 6 weeks and As needed   Please contact office for sooner follow up if symptoms do not improve or worsen or seek emergency care

## 2020-05-27 NOTE — Assessment & Plan Note (Signed)
DVT positive on recent venous Doppler.  Most likely provoked from recent hospitalization.  Patient is continue on Eliquis for at least 3 months we will check a venous Doppler and consider if 6 months of therapy is indicated.  Patient has had no previous DVT or PE.  However during a critical illness had a splenic infarct was on Eliquis for an undetermined amount of time. Patient is doing well on Eliquis continue on current regimen.

## 2020-05-27 NOTE — Telephone Encounter (Signed)
See other phone note from 2.3.2022. Will close this encounter.

## 2020-05-27 NOTE — Assessment & Plan Note (Signed)
Appears compensated without evidence of volume overload on exam continue current regimen ?

## 2020-05-27 NOTE — Progress Notes (Signed)
@Patient  ID: , female    DOB: 1960/07/10, 60 y.o.   MRN: [id number]  Chief Complaint  Patient presents with  . Follow-up    Referring provider: [facility], [place] [facility]  HPI: 60 year old female former smoker with multiple medical problems including rheumatoid arthritis on methotrexate seen for pulmonary consult during hospitalization December 2020 for COVID-19 infection complicated by Covid pneumonia, acute respiratory failure with ARDS requiring prolonged hospitalization.  She was treated with remdesivir Decadron and required oxygen at discharge. Medical history significant for insulin-dependent diabetes, diastolic dysfunction, history of C. difficile colitis.  March 2021 required prolonged antibiotics due to MRSA bacteremia and septic shock with left leg and foot cellulitis and osteomyelitis requiring wound VAC therapy OSA -CPAP intolerant   TEST/EVENTS :  CT chest May 21, 2010 - for PE.  Linear subsegmental atelectasis/scarring in the lingula and left lower lobe.  CT chest July 09, 2019 - for PE is in both upper lobes and posteriorly in the lung bases. 2D echo December 2020 EF 7075%, grade 1 diastolic dysfunction  Echo July 16, 2019 EF 60 to 65%, right ventricular systolic function is normal,no left atrial thrombus detected.  Hospitalization March 2021 septic shock and MRSA bacteremia, TEE negative for vegetation.  She did require a PICC line and received vancomycin x6 weeks.  She was also noted to have a splenic infarct.  And was treated with heparin and transition to Eliquis.  PFTs January 23, 2020 showed mild to moderate restriction with no airflow obstruction FEV1 78%, ratio 84, FVC 71%, no significant bronchodilator response, DLCO 60%.  05/27/2020 Follow up ; postinflammatory fibrosis secondary to COVID-19, oxygen dependent respiratory failure, DVT Patient returns for a 1 week follow-up.  Patient was seen last week with increased shortness of breath.  CT chest  was negative for PE.  There was atelectasis/consolidation in the right lower lobe.  Venous Doppler was positive for nonocclusive DVT in the right popliteal vein.  Negative on the left  Patient was hospitalized March 2021 with septic shock and MRSA bacteremia, TEE was negative for vegetation.  She did require a PICC line and received vancomycin for 6 weeks.  She was also noted to have a splenic infarct and was treated with heparin and discharged on Eliquis.  She was seen in the emergency room and started on Eliquis.  Now back on oxygen full-time at 3 L.  No increased oxygen demand since last visit.   Patient is followed by infectious disease at [facility].  She has had ongoing issues with chronic left great toe osteomyelitis with previous septic shock and MRSA bacteremia requiring prolonged IV antibiotics.  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  This was complicated by recurrent bone necrosis and infection requiring a second resection of necrotic bone December 2028 with positive MRSA infection she has required prolonged IV antibiotics.  She had her PICC line removed yesterday.  She has completed IV vancomycin and cefepime.  She had recent hospitalization last month.  She has underlying rheumatoid arthritis and has been on methotrexate and Plaquenil which were both on hold after having recent herpes zoster.  And was complicated by admission with varicella-zoster encephalitis.  She required hospitalization for encephalopathy and was treated with 10 days of IV acyclovir.  She also has had history of C. difficile colitis in 2019.  Previous admission in March 2021 patient had a splenic infarct and was on Eliquis but unclear length of therapy   Allergies  Allergen  Reactions  . Cephalexin Hives  . Codeine Palpitations, Nausea Only, Nausea And Vomiting, Rash and Shortness Of Breath    "makes heart fly, she gets flushed and passes out"  . Doxycycline  Rash  . Propoxyphene Rash and Shortness Of Breath    Increase heart rate  . Sulfa Antibiotics Palpitations, Nausea Only, Shortness Of Breath and Hives    "makes heart fly, she gets flushed and passes out"  . Clindamycin Rash    Tongue swelling, oral sores, and Mouth rash  . Lovenox [Enoxaparin Sodium] Hives  . Hydrocodone Nausea And Vomiting    Hear racing & breaks out into a cold sweat.  . Meropenem Rash    Erythematous, hot, pruritic rash over arms, chest, back, abdomen, and face occurred at the end of meropenem infusion on 02/22/18    Immunization History  Administered Date(s) Administered  . Influenza, Seasonal, Injecte, Preservative Fre 02/04/2005  . Influenza,inj,Quad PF,6+ Mos 04/14/2015, 03/23/2017, 02/07/2018, 02/01/2019, 02/07/2020  . Influenza-Unspecified 02/02/2019  . PFIZER(Purple Top)SARS-COV-2 Vaccination 04/08/2020  . Pneumococcal Conjugate-13 04/22/2014  . Pneumococcal Polysaccharide-23 03/23/2017, 04/16/2019    Past Medical History:  Diagnosis Date  . Abdominal wall hernia 01/29/2013  . Anxiety   . Arthritis    Rheumatoid  . C. difficile colitis   . Chronic diastolic heart failure (HCC)   . COVID-19 03/23/2019   Diagnosed at [facility] (send-out) on 03/23/2019  . Depression   . Diabetes mellitus    states no meds or diet restrictions  at present  . Diastolic CHF (HCC)   . Esophagitis   . Fluid retention   . GERD (gastroesophageal reflux disease)   . Hiatal hernia   . Hypertension   . Hypokalemia due to loss of potassium 10/21/2015   Overview:  Associated with 3 weeks of diarrhea  And QT prolongation.  . Hypothyroidism   . IBS (irritable bowel syndrome)   . Moderate episode of recurrent major depressive disorder (HCC) 06/03/2004  . Morbid obesity (HCC)   . MRSA (methicillin resistant Staphylococcus aureus) infection 11/2017   left inner thigh abcess  . Neurogenic bladder    has pacemaker  . Neuropathy   . Obesity   . Panic attacks   . Pneumonia due to  COVID-19 virus   . Rheumatoid arthritis (HCC)   . Sleep apnea    STATES SEVERE, CANT TOLERATE MASK- LAST STUDY YEARS AGO    Tobacco History: Social History   Tobacco Use  Smoking Status Former Smoker  . Packs/day: 2.00  . Years: 27.00  . Pack years: 54.00  . Types: Cigarettes  . Quit date: 07/30/1999  . Years since quitting: 20.8  Smokeless Tobacco Never Used   Counseling given: Not Answered   Outpatient Medications Prior to Visit  Medication Sig Dispense Refill  . acetaminophen (TYLENOL) 325 MG tablet Take 2 tablets (650 mg total) by mouth every 6 (six) hours as needed for mild pain or moderate pain (or Fever >/= 101). 20 tablet 0  . ALPRAZolam (XANAX) 0.5 MG tablet Take 1 tablet (0.5 mg total) by mouth 2 (two) times daily as needed for anxiety. 8 tablet 0  . ARIPiprazole (ABILIFY) 5 MG tablet Take 1 tablet (5 mg total) by mouth daily. 30 tablet 5  . ascorbic acid (VITAMIN C) 500 MG tablet Take 1 tablet (500 mg total) by mouth daily. 30 tablet 0  . atorvastatin (LIPITOR) 80 MG tablet Take 80 mg by mouth daily.     . calcium citrate-vitamin D 500-500 MG-UNIT chewable  tablet Chew 0.5 tablets by mouth daily. 30 tablet 0  . Cholecalciferol (VITAMIN D) 125 MCG (5000 UT) CAPS Take 1 capsule by mouth daily.    . famotidine (PEPCID) 20 MG tablet Take 1 tablet (20 mg total) by mouth daily. 30 tablet 0  . FLUoxetine (PROZAC) 20 MG capsule TAKE (1) CAPSULE BY MOUTH ONCE DAILY. (Patient taking differently: Take 20 mg by mouth daily.) 28 capsule 11  . folic acid (FOLVITE) 1 MG tablet Take 1 mg by mouth daily.     . furosemide (LASIX) 20 MG tablet TAKE (1) TABLET BY MOUTH DAILY AS NEEDED. 30 tablet 0  . insulin lispro (HUMALOG) 100 UNIT/ML injection Inject 0.08 mLs (8 Units total) into the skin 3 (three) times daily before meals. 10 mL 11  . ipratropium-albuterol (DUONEB) 0.5-2.5 (3) MG/3ML SOLN Take 3 mLs by nebulization 3 (three) times daily. 360 mL 0  . levothyroxine (SYNTHROID, LEVOTHROID)  88 MCG tablet Take 88 mcg by mouth daily before breakfast.    . metFORMIN (GLUCOPHAGE-XR) 500 MG 24 hr tablet Take 500 mg by mouth daily.    . methotrexate 2.5 MG tablet Take 10 mg by mouth once a week.     . mupirocin ointment (BACTROBAN) 2 % Apply 1 application topically 2 (two) times daily. Intranasal - to affected area. 22 g 0  . polyethylene glycol (MIRALAX / GLYCOLAX) 17 g packet Take 17 g by mouth daily as needed for mild constipation. 14 each 0  . potassium chloride SA (KLOR-CON) 20 MEQ tablet TAKE 1 TABLET BY MOUTH ONCE DAILY. (Patient taking differently: 10 mEq.) 30 tablet 0  . pramipexole (MIRAPEX) 0.125 MG tablet Take 0.25 mg by mouth at bedtime.     . predniSONE (DELTASONE) 10 MG tablet Take 10 mg by mouth daily.    . QUEtiapine (SEROQUEL) 300 MG tablet TAKE ONE TABLET BY MOUTH AT BEDTIME. 30 tablet 2  . solifenacin (VESICARE) 5 MG tablet Take 5 mg by mouth daily.    . TRADJENTA 5 MG TABS tablet Take 5 mg by mouth daily.    . vancomycin IVPB Inject 500 mg into the vein. Unknown dose.  Given at home via picc line    . zinc sulfate 220 (50 Zn) MG capsule Take 220 mg by mouth at bedtime.     Marland Kitchen zolpidem (AMBIEN) 5 MG tablet TAKE ONE TABLET BY MOUTH AT BEDTIME. 28 tablet 5  . apixaban (ELIQUIS) 5 MG TABS tablet Take 2 tablets ( ) twice daily for 7 days, then 1 tablet ( ) twice daily 60 tablet 0  . budesonide (PULMICORT) 0.5 MG/2ML nebulizer solution Take 2 mLs (0.5 mg total) by nebulization 2 (two) times daily. 120 mL 11  . pregabalin (LYRICA) 150 MG capsule Take 1 capsule (150 mg total) by mouth 3 (three) times daily. 90 capsule 5   No facility-administered medications prior to visit.     Review of Systems:   Constitutional:   No  weight loss, night sweats,  Fevers, chills, + fatigue, or  lassitude.  HEENT:   No headaches,  Difficulty swallowing,  Tooth/dental problems, or  Sore throat,                No sneezing, itching, ear ache, nasal congestion, post nasal drip,   CV:   No chest pain,  Orthopnea, PND, swelling in lower extremities, anasarca, dizziness, palpitations, syncope.   GI  No heartburn, indigestion, abdominal pain, nausea, vomiting, diarrhea, change in bowel habits, loss of appetite, bloody stools.  Resp:    No chest wall deformity  Skin: Foot ulcer  GU: no dysuria, change in color of urine, no urgency or frequency.  No flank pain, no hematuria   MS:  No joint pain or swelling.  No decreased range of motion.  No back pain.    Physical Exam  BP 122/74 (BP Location: Left Arm, Cuff Size: Large)   Pulse 81   Temp (!) 97.3 F (36.3 C) (Temporal)   Ht  (1.676 m)   Wt (!) 320 lb (145.2 kg) Comment: weight is per pt  LMP 04/20/2001   SpO2 100%   BMI 51.65 kg/m   GEN: A/Ox3; pleasant , NAD, BMI 51, on oxygen, in wheelchair   HEENT:  [place]/AT,   NOSE-clear, THROAT-clear, no lesions, no postnasal drip or exudate noted.   NECK:  Supple w/ fair ROM; no JVD; normal carotid impulses w/o bruits; no thyromegaly or nodules palpated; no lymphadenopathy.    RESP  Clear  P & A; w/o, wheezes/ rales/ or rhonchi. no accessory muscle use, no dullness to percussion  CARD:  RRR, no m/r/g, tr peripheral edema, pulses intact, no cyanosis or clubbing.  GI:   Soft & nt; nml bowel sounds; no organomegaly or masses detected.   Musco: Warm bil,   Left foot wrapped  Neuro: alert, no focal deficits noted.    Skin: Warm, no lesions or rashes    Lab Results:   BMET  BNP   ProBNP No results found for: PROBNP  Imaging: DG Chest 2 View  Result Date: 05/22/2020 CLINICAL DATA:  Worsening shortness of breath, CHF, diabetes mellitus, had COVID-19 in December 2020, former smoker EXAM: CHEST - 2 VIEW COMPARISON:  08/02/2019 FINDINGS: New elevation of RIGHT diaphragm. RIGHT arm PICC line tip projects over SVC. Normal heart size, mediastinal contours, and pulmonary vascularity. RIGHT basilar atelectasis and small RIGHT pleural effusion. LEFT lung clear. No  definite infiltrate or pneumothorax. Osseous structures unremarkable. IMPRESSION: New elevation of RIGHT diaphragm with RIGHT basilar atelectasis and small RIGHT pleural effusion. Electronically Signed   By: Ulyses Southward M.D.   On: 05/22/2020 09:03   DG Shoulder Right  Result Date: 04/28/2020 CLINICAL DATA:  Shoulder pain EXAM: RIGHT SHOULDER - 2+ VIEW COMPARISON:  12/24/2015 FINDINGS: There is glenohumeral osteoarthritis. There is no acute displaced fracture or dislocation. A partially visualized right-sided PICC line is noted. The osseous mineralization is within normal limits. IMPRESSION: Glenohumeral osteoarthritis. No acute displaced fracture or dislocation. Electronically Signed   By: Katherine Mantle M.D.   On: 04/28/2020 03:38   CT ANGIO CHEST PE W OR WO CONTRAST  Result Date: 05/22/2020 CLINICAL DATA:  Shortness of breath, positive D-dimer, recent COVID, high prob PE EXAM: CT ANGIOGRAPHY CHEST WITH CONTRAST TECHNIQUE: Multidetector CT imaging of the chest was performed using the standard protocol during bolus administration of intravenous contrast. Multiplanar CT image reconstructions and MIPs were obtained to evaluate the vascular anatomy. CONTRAST:  OMNIPAQUE IOHEXOL 350 MG/ML SOLN COMPARISON:  02/19/2020 and previous FINDINGS: Cardiovascular: Right arm central venous catheter extends to the cavoatrial junction. Heart size normal. Trace pericardial fluid. The RV is nondilated. Fair contrast opacification of pulmonary arterial branches. No convincing filling defects to suggest acute PE. Adequate contrast opacification of the thoracic aorta with no evidence of dissection, aneurysm, or stenosis. There is classic 3-vessel brachiocephalic arch anatomy without proximal stenosis. Mediastinum/Nodes: No mass or adenopathy. Lungs/Pleura: No pleural effusion. No pneumothorax. New consolidation/atelectasis in the lateral basal and posterior basal segments  right lower lobe. Chronic coarse linear scarring  or atelectasis in both upper lobes peripherally. Upper Abdomen: Surgical clips around the GE junction. Cholecystectomy clips. No acute findings. Musculoskeletal: No chest wall abnormality. No acute or significant osseous findings. Review of the MIP images confirms the above findings. IMPRESSION: 1. Negative for acute PE or thoracic aortic dissection. 2. Consolidation/atelectasis in the lateral basal and posterior basal segments of the right lower lobe, new since 02/19/2020. Electronically Signed   By: Corlis Leak M.D.   On: 05/22/2020 11:42   US Venous Img Lower Bilateral (DVT)  Result Date: 05/22/2020 CLINICAL DATA:  Edema, positive D-dimer EXAM: BILATERAL LOWER EXTREMITY VENOUS DOPPLER ULTRASOUND TECHNIQUE: Gray-scale sonography with compression, as well as color and duplex ultrasound, were performed to evaluate the deep venous system(s) from the level of the common femoral vein through the popliteal and proximal calf veins. COMPARISON:  01/10/2020 FINDINGS: VENOUS On the left, normal compressibility of the common femoral, superficial femoral, and popliteal veins, as well as the visualized calf veins. Visualized portions of profunda femoral vein and great saphenous vein unremarkable. No filling defects to suggest DVT on grayscale or color Doppler imaging. Doppler waveforms show normal direction of venous flow, normal respiratory phasicity and response to augmentation. On the right, eccentric mural hypoechoic thrombus in the popliteal vein with incomplete compressibility, some continued flow signal on color Doppler. The calf veins are unremarkable. The femoral and common femoral veins remain patent. OTHER None. Limitations: none IMPRESSION: 1. POSITIVE for nonocclusive DVT limited to the right popliteal vein, age indeterminate. 2. Negative for left lower extremity DVT. Electronically Signed   By: Corlis Leak M.D.   On: 05/22/2020 12:14   US Venous Img Upper Uni Right(DVT)  Result Date: 04/28/2020 CLINICAL  DATA:  Pain with right upper extremity PICC for antibiotics EXAM: RIGHT UPPER EXTREMITY VENOUS DOPPLER ULTRASOUND TECHNIQUE: Gray-scale sonography with graded compression, as well as color Doppler and duplex ultrasound were performed to evaluate the upper extremity deep venous system from the level of the subclavian vein and including the jugular, axillary, basilic, radial, ulnar and upper cephalic vein. Spectral Doppler was utilized to evaluate flow at rest and with distal augmentation maneuvers. COMPARISON:  None. FINDINGS: Contralateral Internal Jugular and subclavian Vein: Respiratory phasicity is normal and symmetric with the symptomatic side. No evidence of thrombus. Internal Jugular Vein: No evidence of thrombus. Normal compressibility, respiratory phasicity and response to augmentation. Subclavian Vein: No evidence of thrombus. Normal respiratory phasicity and response to augmentation. Axillary Vein: No evidence of thrombus. Normal compressibility, respiratory phasicity and response to augmentation. Cephalic Vein: Portion of the mid cephalic vein is poorly visualized due to the presence of catheter bandaging material. Otherwise no visible evidence of thrombus. Normal compressibility, respiratory phasicity and response to augmentation. Basilic Vein: No evidence of thrombus. Normal compressibility, respiratory phasicity and response to augmentation. Brachial Veins: No evidence of thrombus. Normal compressibility, respiratory phasicity and response to augmentation. Radial Veins: No evidence of thrombus.  Normal compressibility. Ulnar Veins: No evidence of thrombus.  Normal compressibility. Venous Reflux:  None visualized. Other Findings: Cephalic approach peripherally inserted central catheter. IMPRESSION: Right cephalic approach catheter, partially obscures portion of the cephalic vein at the insertion site. No adherent clot is evident within the visible portions. No evidence of DVT within the right upper  extremity. Electronically Signed   By: Kreg Shropshire M.D.   On: 04/28/2020 05:17   [name] EKG SITE RITE  Result Date: 04/28/2020 If Site Rite image not attached, placement could  not be confirmed due to current cardiac rhythm.     PFT Results Latest Ref Rng & Units 01/23/2020  FVC-Predicted Pre % 71  FVC-Post L 2.45  FVC-Predicted Post % 67  Pre FEV1/FVC % % 84  Post FEV1/FCV % % 83  FEV1-Pre L 2.19  FEV1-Predicted Pre % 78  FEV1-Post L 2.03  DLCO uncorrected ml/min/mmHg 13.29  DLCO UNC% % 60  DLVA Predicted % 74  TLC L 4.40  TLC % Predicted % 82  RV % Predicted % 77    No results found for: NITRICOXIDE      Assessment & Plan:   COPD (chronic obstructive pulmonary disease) (HCC) Continue on Pulmicort and DuoNeb nebulizer twice daily. Recent CT did show some increased atelectasis consolidation in the right lower lobe.  Patient has completed IV antibiotics for recurrent osteomyelitis and wound infection.  This should also cover a potential pneumonia. No further antibiotics at this time.  Chronic diastolic CHF (congestive heart failure) (HCC) Appears compensated without evidence of volume overload on exam continue current regimen  Postinflammatory pulmonary fibrosis (HCC) Appears stable.  Patient has some post inflammatory fibrosis on CT scan.  Recent CT scan showed improvement compared to previous scans.  Chronic respiratory failure with hypoxia (HCC) Oxygen pendant respiratory failure suspect is multifactorial.  Patient is continue on current regimen recent CT chest was negative for PE.  DVT of popliteal vein (HCC) DVT positive on recent venous Doppler.  Most likely provoked from recent hospitalization.  Patient is continue on Eliquis for at least 3 months we will check a venous Doppler and consider if 6 months of therapy is indicated.  Patient has had no previous DVT or PE.  However during a critical illness had a splenic infarct was on Eliquis for an undetermined amount of  time. Patient is doing well on Eliquis continue on current regimen.     Rubye Oaks, NP 05/27/2020

## 2020-05-27 NOTE — Assessment & Plan Note (Addendum)
Continue on Pulmicort and DuoNeb nebulizer twice daily. Recent CT did show some increased atelectasis consolidation in the right lower lobe.  Patient has completed IV antibiotics for recurrent osteomyelitis and wound infection.  This should also cover a potential pneumonia. No further antibiotics at this time.

## 2020-05-27 NOTE — Assessment & Plan Note (Signed)
Oxygen pendant respiratory failure suspect is multifactorial.  Patient is continue on current regimen recent CT chest was negative for PE.

## 2020-05-27 NOTE — Assessment & Plan Note (Signed)
Appears stable.  Patient has some post inflammatory fibrosis on CT scan.  Recent CT scan showed improvement compared to previous scans.

## 2020-05-28 ENCOUNTER — Ambulatory Visit: Admit: 2020-05-28 | Discharge: 2020-05-29 | Payer: MEDICAID

## 2020-05-28 ENCOUNTER — Ambulatory Visit
Admit: 2020-05-28 | Discharge: 2020-05-29 | Payer: MEDICAID | Attending: Foot & Ankle Surgery | Primary: Foot & Ankle Surgery

## 2020-05-28 DIAGNOSIS — Z6841 Body Mass Index (BMI) 40.0 and over, adult: Principal | ICD-10-CM

## 2020-05-28 DIAGNOSIS — Z89419 Acquired absence of unspecified great toe: Principal | ICD-10-CM

## 2020-05-28 DIAGNOSIS — E11621 Type 2 diabetes mellitus with foot ulcer: Principal | ICD-10-CM

## 2020-05-28 DIAGNOSIS — Z9889 Other specified postprocedural states: Principal | ICD-10-CM

## 2020-05-28 DIAGNOSIS — L97522 Non-pressure chronic ulcer of other part of left foot with fat layer exposed: Principal | ICD-10-CM

## 2020-05-28 DIAGNOSIS — Z189 Retained foreign body fragments, unspecified material: Principal | ICD-10-CM

## 2020-05-28 DIAGNOSIS — E139 Other specified diabetes mellitus without complications: Principal | ICD-10-CM

## 2020-05-28 DIAGNOSIS — M79675 Pain in left toe(s): Principal | ICD-10-CM

## 2020-05-28 DIAGNOSIS — M79674 Pain in right toe(s): Principal | ICD-10-CM

## 2020-05-28 DIAGNOSIS — L97422 Non-pressure chronic ulcer of left heel and midfoot with fat layer exposed: Principal | ICD-10-CM

## 2020-05-30 DIAGNOSIS — M0579 Rheumatoid arthritis with rheumatoid factor of multiple sites without organ or systems involvement: Principal | ICD-10-CM

## 2020-05-30 DIAGNOSIS — I110 Hypertensive heart disease with heart failure: Principal | ICD-10-CM

## 2020-05-30 DIAGNOSIS — N3946 Mixed incontinence: Principal | ICD-10-CM

## 2020-05-30 DIAGNOSIS — T8754 Necrosis of amputation stump, left lower extremity: Principal | ICD-10-CM

## 2020-05-30 DIAGNOSIS — K219 Gastro-esophageal reflux disease without esophagitis: Principal | ICD-10-CM

## 2020-05-30 DIAGNOSIS — U099 Post covid-19 condition, unspecified: Principal | ICD-10-CM

## 2020-05-30 DIAGNOSIS — A411 Sepsis due to other specified staphylococcus: Principal | ICD-10-CM

## 2020-05-30 DIAGNOSIS — J9611 Chronic respiratory failure with hypoxia: Principal | ICD-10-CM

## 2020-05-30 DIAGNOSIS — F319 Bipolar disorder, unspecified: Principal | ICD-10-CM

## 2020-05-30 DIAGNOSIS — J449 Chronic obstructive pulmonary disease, unspecified: Principal | ICD-10-CM

## 2020-05-30 DIAGNOSIS — I5032 Chronic diastolic (congestive) heart failure: Principal | ICD-10-CM

## 2020-05-30 DIAGNOSIS — J8410 Pulmonary fibrosis, unspecified: Principal | ICD-10-CM

## 2020-05-30 DIAGNOSIS — E13621 Other specified diabetes mellitus with foot ulcer: Principal | ICD-10-CM

## 2020-05-30 DIAGNOSIS — E559 Vitamin D deficiency, unspecified: Principal | ICD-10-CM

## 2020-05-30 DIAGNOSIS — E039 Hypothyroidism, unspecified: Principal | ICD-10-CM

## 2020-05-30 DIAGNOSIS — E1342 Other specified diabetes mellitus with diabetic polyneuropathy: Principal | ICD-10-CM

## 2020-05-30 DIAGNOSIS — G4733 Obstructive sleep apnea (adult) (pediatric): Principal | ICD-10-CM

## 2020-05-30 DIAGNOSIS — L97528 Non-pressure chronic ulcer of other part of left foot with other specified severity: Principal | ICD-10-CM

## 2020-05-30 DIAGNOSIS — A4102 Sepsis due to Methicillin resistant Staphylococcus aureus: Principal | ICD-10-CM

## 2020-05-30 DIAGNOSIS — E1369 Other specified diabetes mellitus with other specified complication: Principal | ICD-10-CM

## 2020-05-30 DIAGNOSIS — M86171 Other acute osteomyelitis, right ankle and foot: Principal | ICD-10-CM

## 2020-05-30 DIAGNOSIS — Z452 Encounter for adjustment and management of vascular access device: Principal | ICD-10-CM

## 2020-05-30 DIAGNOSIS — G894 Chronic pain syndrome: Principal | ICD-10-CM

## 2020-05-30 DIAGNOSIS — F419 Anxiety disorder, unspecified: Principal | ICD-10-CM

## 2020-05-31 ENCOUNTER — Encounter: Admit: 2020-05-31 | Discharge: 2020-06-29 | Payer: MEDICAID

## 2020-06-03 ENCOUNTER — Ambulatory Visit: Admit: 2020-06-03 | Discharge: 2020-06-04 | Payer: MEDICAID

## 2020-06-04 ENCOUNTER — Ambulatory Visit: Admit: 2020-06-04 | Discharge: 2020-06-11 | Disposition: A | Discharge status: Home Health | Payer: MEDICAID

## 2020-06-04 ENCOUNTER — Encounter: Admit: 2020-06-04 | Discharge: 2020-06-11 | Disposition: A | Discharge status: Home Health | Payer: MEDICAID

## 2020-06-05 ENCOUNTER — Ambulatory Visit: Payer: Medicaid Other | Admitting: Student in an Organized Health Care Education/Training Program

## 2020-06-06 MED ORDER — METHOTREXATE SODIUM 2.5 MG TABLET
ORAL_TABLET | 11 refills | 0 days
Start: 2020-06-06 — End: ?

## 2020-06-06 NOTE — Progress Notes (Signed)
Agree with the details of the procedure as noted by Tammy Parrett, NP.  C. Laura Earnestine Shipp, MD [facility] 

## 2020-06-09 MED ORDER — METHOTREXATE SODIUM 2.5 MG TABLET
ORAL_TABLET | 11 refills | 0 days
Start: 2020-06-09 — End: ?

## 2020-06-10 MED ORDER — LIDOCAINE 5 % TOPICAL PATCH
MEDICATED_PATCH | Freq: Every day | TRANSDERMAL | 0 refills | 30.00000 days | PRN
Start: 2020-06-10 — End: 2020-07-10

## 2020-06-11 MED ORDER — LINEZOLID 600 MG TABLET
ORAL_TABLET | Freq: Two times a day (BID) | ORAL | 0 refills | 3.00000 days | Status: CP
Start: 2020-06-11 — End: 2020-06-14
  Filled 2020-06-11: qty 5, 3d supply, fill #0

## 2020-06-11 MED ORDER — DICLOFENAC 1 % TOPICAL GEL
Freq: Four times a day (QID) | TOPICAL | 0 refills | 13 days | Status: CP | PRN
Start: 2020-06-11 — End: 2021-06-11
  Filled 2020-06-11: qty 100, 13d supply, fill #0

## 2020-06-11 MED ORDER — LIDOCAINE 4 % TOPICAL PATCH
MEDICATED_PATCH | Freq: Every day | TRANSDERMAL | 0 refills | 30 days | Status: CP | PRN
Start: 2020-06-11 — End: 2020-07-11
  Filled 2020-06-11: qty 10, 10d supply, fill #0

## 2020-06-12 MED ORDER — LEVOFLOXACIN 500 MG TABLET
ORAL_TABLET | ORAL | 0 refills | 2.00000 days | Status: CP
Start: 2020-06-12 — End: 2020-06-14
  Filled 2020-06-11: qty 2, 2d supply, fill #0

## 2020-06-17 ENCOUNTER — Ambulatory Visit
Payer: Medicaid Other | Attending: Student in an Organized Health Care Education/Training Program | Admitting: Student in an Organized Health Care Education/Training Program

## 2020-06-17 ENCOUNTER — Other Ambulatory Visit: Payer: Self-pay

## 2020-06-17 ENCOUNTER — Encounter: Payer: Self-pay | Admitting: Student in an Organized Health Care Education/Training Program

## 2020-06-17 VITALS — BP 133/85 | HR 75 | Temp 97.0°F | Resp 16 | Ht 66.0 in | Wt 320.0 lb

## 2020-06-17 DIAGNOSIS — M05712 Rheumatoid arthritis with rheumatoid factor of left shoulder without organ or systems involvement: Secondary | ICD-10-CM | POA: Insufficient documentation

## 2020-06-17 DIAGNOSIS — G8929 Other chronic pain: Secondary | ICD-10-CM | POA: Insufficient documentation

## 2020-06-17 DIAGNOSIS — M25532 Pain in left wrist: Secondary | ICD-10-CM | POA: Diagnosis present

## 2020-06-17 DIAGNOSIS — E1142 Type 2 diabetes mellitus with diabetic polyneuropathy: Secondary | ICD-10-CM | POA: Diagnosis not present

## 2020-06-17 DIAGNOSIS — M25529 Pain in unspecified elbow: Secondary | ICD-10-CM

## 2020-06-17 DIAGNOSIS — M25531 Pain in right wrist: Secondary | ICD-10-CM | POA: Insufficient documentation

## 2020-06-17 DIAGNOSIS — G894 Chronic pain syndrome: Secondary | ICD-10-CM | POA: Insufficient documentation

## 2020-06-17 DIAGNOSIS — M79673 Pain in unspecified foot: Secondary | ICD-10-CM

## 2020-06-17 DIAGNOSIS — M792 Neuralgia and neuritis, unspecified: Secondary | ICD-10-CM | POA: Diagnosis not present

## 2020-06-17 DIAGNOSIS — M05711 Rheumatoid arthritis with rheumatoid factor of right shoulder without organ or systems involvement: Secondary | ICD-10-CM | POA: Insufficient documentation

## 2020-06-17 DIAGNOSIS — M059 Rheumatoid arthritis with rheumatoid factor, unspecified: Secondary | ICD-10-CM | POA: Diagnosis present

## 2020-06-17 MED ORDER — OXYCODONE-ACETAMINOPHEN 5-325 MG PO TABS: 1.0000 | ORAL_TABLET | Freq: Four times a day (QID) | ORAL | 0 refills | Status: AC | PRN

## 2020-06-17 MED ORDER — OXYCODONE-ACETAMINOPHEN 5-325 MG PO TABS: 1.0000 | ORAL_TABLET | Freq: Four times a day (QID) | ORAL | 0 refills | Status: DC | PRN

## 2020-06-17 MED ORDER — PREGABALIN 150 MG PO CAPS: 150.0000 mg | ORAL_CAPSULE | Freq: Three times a day (TID) | ORAL | 5 refills | Status: DC

## 2020-06-17 MED ORDER — OXYCODONE-ACETAMINOPHEN 5 MG-325 MG TABLET
0 days
Start: 2020-06-17 — End: ?

## 2020-06-17 MED ORDER — ZOLPIDEM 5 MG TABLET
ORAL_TABLET | 0 refills | 0 days
Start: 2020-06-17 — End: ?

## 2020-06-17 NOTE — Progress Notes (Signed)
Nursing Pain Medication Assessment:  Safety precautions to be maintained throughout the outpatient stay will include: orient to surroundings, keep bed in low position, maintain call bell within reach at all times, provide assistance with transfer out of bed and ambulation.  Medication Inspection Compliance: Pill count conducted under aseptic conditions, in front of the patient. Neither the pills nor the bottle was removed from the patient's sight at any time. Once count was completed pills were immediately returned to the patient in their original bottle.  Medication: Oxycodone/APAP Pill/Patch Count: 0 of 120 pills remain Pill/Patch Appearance: Markings consistent with prescribed medication Bottle Appearance: pharmacy label partially torn off/unable to see fill date Last Medication intake:  Ran out of medicine more than 48 hours ago

## 2020-06-17 NOTE — Progress Notes (Signed)
PROVIDER NOTE: Information contained herein reflects review and annotations entered in association with encounter. Interpretation of such information and data should be left to medically-trained personnel. Information provided to patient can be located elsewhere in the medical record under "Patient Instructions". Document created using STT-dictation technology, any transcriptional errors that may result from process are unintentional.    Patient: [name] [name]  Service Category: E/M  Provider: Gillis Santa, MD  DOB: [birth year]  DOS: 06/17/2020  Specialty: Interventional Pain Management  MRN: [id number]  Setting: Ambulatory outpatient  PCP: [facility] [facility]  Type: Established Patient    Referring Provider: [facility]  Location: Office  Delivery: Face-to-face     HPI  [name], a 60 y.o. year old female, is here today because of her Diabetic peripheral neuropathy ([place]) [E11.42]. [name] primary complain today is Pain (Generalized body pain) Last encounter: My last encounter with her was on 05/13/2020. Pertinent problems: [name] has Chronic wrist pain (Secondary area of Pain) (Bilateral) (L>R); Chronic knee pain (Bilateral) (R>L); Long term current use of opiate analgesic; Opiate use (30 MME/Day); Neurogenic pain; Neuropathic pain; Diabetic peripheral neuropathy ([place]); Osteoarthritis, multiple sites; Chronic foot pain (Primary Area of Pain) (Bilateral) (L>R); Chronic elbow pain (Third area of Pain) (Bilateral) (L>R); Chronic upper back pain; Chronic pain syndrome; Rheumatoid arthritis involving multiple sites with positive rheumatoid factor ([place]); CKD (chronic kidney disease), stage III ([place]); Morbid obesity with BMI of 50.0-59.9, adult ([place]); and Severe sepsis with septic shock ([place]) on their pertinent problem list. Pain Assessment: Severity of Chronic pain is reported as a 10-Worst pain ever/10. Location: Other (Comment) (generalized body pain "r/t arthristis and shingles  RIGHT SHOULDER TO RIGHT BREAST)  / . Onset: More than a month ago. Quality: Aching,Shooting. Timing: Constant. Modifying factor(s): MEDS. Vitals:  height is 5' 6" (1.676 m) and weight is 320 lb (145.2 kg) (abnormal). Her temporal temperature is 97 F (36.1 C) (abnormal). Her blood pressure is 133/85 and her pulse is 75. Her respiration is 16 and oxygen saturation is 100%.   Reason for encounter: medication management.    Patient was recently discharged from the hospital.  She was admitted from 2/16 to 06/11/2020.  See summary of hospital course as below.  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 RPP negative. Patient also with warm, exquisitely tender, erythematous rash on RLE highly concerning for cellulitis. Cellulitis highly suspected to be source of sepsis. Vanc/cefepime initiated on 2/16 for empiric coverage as well as a 3-day course of azithromycin (2/16-2/18). Patient mildly immunocompromised with chronic steroid use given active prednisone taper for seropositive rheumatoid arthritis (see below). Deferred prophylaxis given  low risk for fungal  etiologies. Blood cultures without growth for 48 h, cefepime discontinued (2/16-2/19) and narrowed to Levaquin. Notably, patient with allergies to doxycycline, clindamycin, sulfa drugs which were avoided during her hospitalization. Lower respiratory culture could not be obtained as patient never presented with cough. Vancomycin continued for MRSA coverage. Vancomycin (2/16-2/20) deescalated to PO Linezolid (2/20) given no growth on cultures after 4 days with improvement of cellulitis. She was discharged on p.o. linezolid and Levaquin to complete a total 10-day course. Patient instructed to hold home BuSpar and Prozac as though completion of antibiotic course given risk of serotonin syndrome.  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 Beather Arbour, last seen 05/26/20. Complicated course with persistent infection. Most recent cultures from resection 03/2020 with MRSA and staph lugdenensis - now s/p distal first metatarsal resection. Completed 6 weeks of vancomycin/cefepime/flagyl on 05/26/20. Wound care consulted for assistance with management. Podiatry also consulted given recent surgery with podiatry on 12/28 with Dr. Janus Molder. Discharged with weightbearing restrictions including weightbearing as tolerated with appropriate boot/offloading shoe for less than 5 ft of left lower extremity.  R distal fibular fracture Found incidentally on right LE x-ray. Initially extremely tender to movement. Orthopedics consulted, no indication for acute surgical intervention. Recommended weightbearing as tolerated in appropriate boot. PT/OT consulted, evaluated and treated patient during hospital course. Recommended home physical therapy and home health set up for patient prior to discharge.  Urinary obstruction  AKI (resolved) AKI on admission with creatinine elevated to 1.6 from baseline 0.6-0.7 on presentation, bladder scan with urine retention greater  than 700 cc of urine. AKI believed to likely multifactorial including prerenal due to septic shock and post renal due to obstruction. Patient with baseline history of urinary retention with usually 300 cc of urine. Follow-up with [facility] urology, last seen 7/21. On Vesicare at home. Foley placed on 2/17. AKI improved throughout hospital course with p.o. hydration and resolved prior to discharge. Failed trial of void on 2/21 with bladder scans 300 to 400 cc of urine. Patient with difficult Foley insertion, I/O cath deferred and Foley reinserted on 2/22 which she was discharged on. Outpatient follow-up with her Urologist was scheduled for 1 to 2 weeks post discharge.  Fall x2 Secondary to generalized weakness, likely secondary to acute infection as above. Does not do much activity at home. XR with R fibula fracture as noted. PT/OT consulted and followed patient throughout hospital course as above. Pain managed with PRN tylenol and low dose oxycodone.  Encephalopathy Most consistent with acute septic shock as above. TSH, VBG normal. Utox appropriately positive for opiates.  Seropositive Rheumatoid Arthritis Followed by Dr. Danella Penton with [facility]ology. Last seen 05/26/20. At that time, patient reported that she had been put on a prednisone taper starting with 24m daily, but it is unclear who/when this was prescribed. Previously on HCQ and Methotrexate, however, Methotrexate held recently given osteomyelitis. Appeared HCQ was refilled at said appointment, but appears rx may have been cancelled due to high costs ($225). Reported only taking 593mdaily on admission. HCQ and MTX held during hospitalization. Prednisone 5 mg continued for 7 days which she completed on day of discharge.  T2DM Hgb A1c on admission 7.6%. Home Lantus held and she was managed with sliding scale insulin.  Patient states that she is still recovering from her hospital admission.  She is on 4 L of oxygen at this moment.  She is living  at home however her mother and her family member  are there to help take care of her.  We discussed the importance of limiting her opioid intake and to avoid taking it with Ambien given risk of respiratory depression in the context of her being morbidly obese and being on home oxygen.  Recommend that she consider weaning her dose to 3 times daily as needed however patient states that she is unable to do that at this time given increased pain as result of orthopedic injury and chronic musculoskeletal pain.    Pharmacotherapy Assessment   Analgesic: Percocet 5 mg 4 times daily as needed, quantity 120/month; MME equals 30 Monitoring: [place] PMP: PDMP reviewed during this encounter.       Pharmacotherapy: No side-effects or adverse reactions reported. Compliance: No problems identified. Effectiveness: Clinically acceptable.  Rise Patience, RN  06/17/2020 11:54 AM  Sign when Signing Visit Nursing Pain Medication Assessment:  Safety precautions to be maintained throughout the outpatient stay will include: orient to surroundings, keep bed in low position, maintain call bell within reach at all times, provide assistance with transfer out of bed and ambulation.  Medication Inspection Compliance: Pill count conducted under aseptic conditions, in front of the patient. Neither the pills nor the bottle was removed from the patient's sight at any time. Once count was completed pills were immediately returned to the patient in their original bottle.  Medication: Oxycodone/APAP Pill/Patch Count: 0 of 120 pills remain Pill/Patch Appearance: Markings consistent with prescribed medication Bottle Appearance: pharmacy label partially torn off/unable to see fill date Last Medication intake:  Ran out of medicine more than 48 hours ago    UDS:  Summary  Date Value Ref Range Status  12/11/2019 Note  Final    Comment:    ==================================================================== [employer] Select 13  (MW) ==================================================================== Test                             Result       Flag       Units  Drug Present not Declared for Prescription Verification   Oxycodone                      1378         UNEXPECTED ng/mg creat   Oxymorphone                    410          UNEXPECTED ng/mg creat   Noroxycodone                   2537         UNEXPECTED ng/mg creat    Sources of oxycodone include scheduled prescription medications.    Oxymorphone and noroxycodone are expected metabolites of oxycodone.    Oxymorphone is also available as a scheduled prescription medication.  Drug Absent but Declared for Prescription Verification   Alprazolam                     Not Detected UNEXPECTED ng/mg creat ==================================================================== Test                      Result    Flag   Units      Ref Range   Creatinine              63               mg/dL      >=  20 ==================================================================== Declared Medications:  The flagging and interpretation on this report are based on the  following declared medications.  Unexpected results may arise from  inaccuracies in the declared medications.   **Note: The testing scope of this panel includes these medications:   Alprazolam   **Note: The testing scope of this panel does not include the  following reported medications:   Albuterol (Duoneb)  Aripiprazole (Abilify)  Atorvastatin  Budesonide (Pulmicort)  Buspirone (Buspar)  Calcium  Cholecalciferol  Famotidine  Fluoxetine (Prozac)  Folic Acid  Furosemide  Insulin Tyler Aas)  Ipratropium (Duoneb)  Levothyroxine  Polyethylene Glycol (MiraLAX)  Potassium (Klor-Con)  Pramipexole (Mirapex)  Pregabalin (Lyrica)  Quetiapine (Seroquel)  Topical  Vitamin C  Vitamin D  Zolpidem (Ambien) ==================================================================== For clinical consultation, please call  [phone]. ====================================================================      ROS  Constitutional: Fatigue Gastrointestinal: No reported hemesis, hematochezia, vomiting, or acute GI distress Musculoskeletal: Diffuse polyarthralgias polymyalgias Neurological: No reported episodes of acute onset apraxia, aphasia, dysarthria, agnosia, amnesia, paralysis, loss of coordination, or loss of consciousness  Medication Review  ALPRAZolam, ARIPiprazole, FLUoxetine, QUEtiapine, Vitamin D, acetaminophen, apixaban, ascorbic acid, atorvastatin, budesonide, calcium citrate-vitamin D, famotidine, folic acid, furosemide, insulin lispro, ipratropium-albuterol, levothyroxine, linagliptin, metFORMIN, mupirocin ointment, oxyCODONE-acetaminophen, polyethylene glycol, pramipexole, pregabalin, solifenacin, vancomycin, zinc sulfate, and zolpidem  History Review  Allergy: [name] is allergic to cephalexin, codeine, doxycycline, propoxyphene, sulfa antibiotics, clindamycin, lovenox [enoxaparin sodium], hydrocodone, and meropenem. Drug: [name]  reports no history of drug use. Alcohol:  reports no history of alcohol use. Tobacco:  reports that she quit smoking about 20 years ago. Her smoking use included cigarettes. She has a 54.00 pack-year smoking history. She has never used smokeless tobacco. Social: [name]  reports that she quit smoking about 20 years ago. Her smoking use included cigarettes. She has a 54.00 pack-year smoking history. She has never used smokeless tobacco. She reports that she does not drink alcohol and does not use drugs. Medical:  has a past medical history of Abdominal wall hernia (01/29/2013), Anxiety, Arthritis, C. difficile colitis, Chronic diastolic heart failure ([place]), COVID-19 (03/23/2019), Depression, Diabetes mellitus, Diastolic CHF ([place]), Esophagitis, Fluid retention, GERD (gastroesophageal reflux disease), Hiatal hernia, Hypertension, Hypokalemia due to loss of potassium  (10/21/2015), Hypothyroidism, IBS (irritable bowel syndrome), Moderate episode of recurrent major depressive disorder ([place]) (06/03/2004), Morbid obesity ([place]), MRSA (methicillin resistant Staphylococcus aureus) infection (11/2017), Neurogenic bladder, Neuropathy, Obesity, Panic attacks, Pneumonia due to COVID-19 virus, Rheumatoid arthritis ([place]), and Sleep apnea. Surgical: [name]  has a past surgical history that includes Tubal ligation; Tonsillectomy; Cholecystectomy; Abdominal hysterectomy; Laparoscopic gastric banding (03/20/07); Eye surgery; Hernia repair; DG GREAT TOE RIGHT FOOT (02/23/2018); TEE without cardioversion (N/A, 07/16/2019); and Irrigation and debridement foot (Left, 01/11/2020). Family: family history includes Alcohol abuse in her father and sister; Anxiety disorder in her father, sister, and sister; Bipolar disorder in her father and sister; Depression in her father, sister, and sister; Drug abuse in her sister; Heart attack in her brother; Heart attack (age of onset: 18) in her brother; Heart disease in her brother; Heart failure in her father.  Laboratory Chemistry Profile   Renal Lab Results  Component Value Date   BUN 14 05/22/2020   CREATININE 0.71 05/22/2020   BCR 16 11/16/2018   GFRAA >60 01/15/2020   GFRNONAA >60 05/22/2020     Hepatic Lab Results  Component Value Date   AST 32 04/28/2020   ALT 31 04/28/2020   ALBUMIN 3.3 (L) 05/21/2020   ALKPHOS 70  04/28/2020   LIPASE 176 (H) 07/13/2019     Electrolytes Lab Results  Component Value Date   NA 139 05/22/2020   K 3.5 05/22/2020   CL 97 (L) 05/22/2020   CALCIUM 9.0 05/22/2020   MG 1.7 01/15/2020   PHOS 2.8 05/21/2020     Bone Lab Results  Component Value Date   VD25OH 37.36 04/30/2019   25OHVITD1 23 (L) 11/16/2018   25OHVITD2 2.2 11/16/2018   25OHVITD3 21 11/16/2018     Inflammation (CRP: Acute Phase) (ESR: Chronic Phase) Lab Results  Component Value Date   CRP 2.1 (H) 07/17/2019   ESRSEDRATE 65  (H) 07/17/2019   LATICACIDVEN 1.4 04/28/2020       Note: Above Lab results reviewed.  Recent Imaging Review  US Venous Img Lower Bilateral (DVT) CLINICAL DATA:  Edema, positive D-dimer  EXAM: BILATERAL LOWER EXTREMITY VENOUS DOPPLER ULTRASOUND  TECHNIQUE: Gray-scale sonography with compression, as well as color and duplex ultrasound, were performed to evaluate the deep venous system(s) from the level of the common femoral vein through the popliteal and proximal calf veins.  COMPARISON:  01/10/2020  FINDINGS: VENOUS  On the left, normal compressibility of the common femoral, superficial femoral, and popliteal veins, as well as the visualized calf veins. Visualized portions of profunda femoral vein and great saphenous vein unremarkable. No filling defects to suggest DVT on grayscale or color Doppler imaging. Doppler waveforms show normal direction of venous flow, normal respiratory phasicity and response to augmentation.  On the right, eccentric mural hypoechoic thrombus in the popliteal vein with incomplete compressibility, some continued flow signal on color Doppler. The calf veins are unremarkable. The femoral and common femoral veins remain patent.  OTHER  None.  Limitations: none  IMPRESSION: 1. POSITIVE for nonocclusive DVT limited to the right popliteal vein, age indeterminate. 2. Negative for left lower extremity DVT.  Electronically Signed   By: Lucrezia Europe M.D.   On: 05/22/2020 12:14 CT ANGIO CHEST PE W OR WO CONTRAST CLINICAL DATA:  Shortness of breath, positive D-dimer, recent COVID, high prob PE  EXAM: CT ANGIOGRAPHY CHEST WITH CONTRAST  TECHNIQUE: Multidetector CT imaging of the chest was performed using the standard protocol during bolus administration of intravenous contrast. Multiplanar CT image reconstructions and MIPs were obtained to evaluate the vascular anatomy.  CONTRAST:  180m OMNIPAQUE IOHEXOL 350 MG/ML SOLN  COMPARISON:   02/19/2020 and previous  FINDINGS: Cardiovascular: Right arm central venous catheter extends to the cavoatrial junction. Heart size normal. Trace pericardial fluid. The RV is nondilated. Fair contrast opacification of pulmonary arterial branches. No convincing filling defects to suggest acute PE. Adequate contrast opacification of the thoracic aorta with no evidence of dissection, aneurysm, or stenosis. There is classic 3-vessel brachiocephalic arch anatomy without proximal stenosis.  Mediastinum/Nodes: No mass or adenopathy.  Lungs/Pleura: No pleural effusion. No pneumothorax. New consolidation/atelectasis in the lateral basal and posterior basal segments right lower lobe. Chronic coarse linear scarring or atelectasis in both upper lobes peripherally.  Upper Abdomen: Surgical clips around the GE junction. Cholecystectomy clips. No acute findings.  Musculoskeletal: No chest wall abnormality. No acute or significant osseous findings.  Review of the MIP images confirms the above findings.  IMPRESSION: 1. Negative for acute PE or thoracic aortic dissection. 2. Consolidation/atelectasis in the lateral basal and posterior basal segments of the right lower lobe, new since 02/19/2020.  Electronically Signed   By: DLucrezia EuropeM.D.   On: 05/22/2020 11:42 DG Chest 2 View CLINICAL DATA:  Worsening shortness  of breath, CHF, diabetes mellitus, had COVID-19 in December 2020, former smoker  EXAM: CHEST - 2 VIEW  COMPARISON:  08/02/2019  FINDINGS: New elevation of RIGHT diaphragm.  RIGHT arm PICC line tip projects over SVC.  Normal heart size, mediastinal contours, and pulmonary vascularity.  RIGHT basilar atelectasis and small RIGHT pleural effusion.  LEFT lung clear.  No definite infiltrate or pneumothorax.  Osseous structures unremarkable.  IMPRESSION: New elevation of RIGHT diaphragm with RIGHT basilar atelectasis and small RIGHT pleural effusion.  Electronically  Signed   By: Lavonia [name] M.D.   On: 05/22/2020 09:03 Note: Reviewed        Physical Exam  General appearance: alert, cooperative and morbidly obese Mental status: Alert, oriented x 3 (person, place, & time)       Respiratory: 4 L of home oxygen Eyes: PERLA Vitals: BP 133/85   Pulse 75   Temp (!) 97 F (36.1 C) (Temporal)   Resp 16 Comment: 3 L O2  Ht 5' 6" (1.676 m)   Wt (!) 320 lb (145.2 kg)   LMP 04/20/2001   SpO2 100%   BMI 51.65 kg/m  BMI: Estimated body mass index is 51.65 kg/m as calculated from the following:   Height as of this encounter: 5' 6" (1.676 m).   Weight as of this encounter: 320 lb (145.2 kg). Ideal: Ideal body weight: 59.3 kg (130 lb 11.7 oz) Adjusted ideal body weight: 93.6 kg (206 lb 7 oz)  Patient comes today in a wheelchair.  Morbidly obese.  Lower extremity edema, pitting Low back pain, bilateral hip pain, bilateral knee pain Neuropathic pain of bilateral lower extremity Musculoskeletal pain overlying various joints most pronounced in her elbow, wrist, hips, low back  Assessment   Status Diagnosis  Controlled Controlled Controlled 1. Diabetic peripheral neuropathy ([place])   2. Neurogenic pain   3. Chronic elbow pain (Third area of Pain) (Bilateral) (L>R)   4. Rheumatoid arthritis involving both shoulders with positive rheumatoid factor (H[facility])   5. Chronic wrist pain (Secondary area of Pain) (Bilateral) (L>R)   6. Chronic foot pain (Primary Area of Pain) (Bilateral) (L>R)   7. Neuropathic pain   8. Rheumatoid arthritis with positive rheumatoid factor, involving unspecified site ([place])   9. Chronic pain syndrome        Plan of Care   [name] has a current medication list which includes the following long-term medication(s): apixaban, aripiprazole, budesonide, calcium citrate-vitamin d, famotidine, fluoxetine, furosemide, insulin lispro, ipratropium-albuterol, quetiapine, zolpidem, and pregabalin.  Pharmacotherapy (Medications  Ordered): Meds ordered this encounter  Medications  . oxyCODONE-acetaminophen (PERCOCET) 5-325 MG tablet    Sig: Take 1 tablet by mouth every 6 (six) hours as needed for severe pain. Must last 30 days.    Dispense:  120 tablet    Refill:  0    Chronic Pain. (STOP Act - Not applicable). Fill one day early if closed on scheduled refill date.  . pregabalin (LYRICA) 150 MG capsule    Sig: Take 1 capsule (150 mg total) by mouth 3 (three) times daily.    Dispense:  90 capsule    Refill:  5    Fill one day early if pharmacy is closed on scheduled refill date. May substitute for generic if available.  Marland Kitchen oxyCODONE-acetaminophen (PERCOCET) 5-325 MG tablet    Sig: Take 1 tablet by mouth every 6 (six) hours as needed for severe pain. Must last 30 days.    Dispense:  120 tablet  Refill:  0    Chronic Pain. (STOP Act - Not applicable). Fill one day early if closed on scheduled refill date.  Marland Kitchen oxyCODONE-acetaminophen (PERCOCET) 5-325 MG tablet    Sig: Take 1 tablet by mouth every 6 (six) hours as needed for severe pain. Must last 30 days.    Dispense:  120 tablet    Refill:  0    Chronic Pain. (STOP Act - Not applicable). Fill one day early if closed on scheduled refill date.   Follow-up plan:   Return in about 3 months (around 09/17/2020) for Medication Management, in person.   Recent Visits No visits were found meeting these conditions. Showing recent visits within past 90 days and meeting all other requirements Today's Visits Date Type Provider Dept  06/17/20 Office Visit Gillis Santa, MD [facility]  Showing today's visits and meeting all other requirements Future Appointments Date Type Provider Dept  09/09/20 Appointment Gillis Santa, MD [facility]  Showing future appointments within next 90 days and meeting all other requirements  I discussed the assessment and treatment plan with the patient. The patient was provided an opportunity to ask questions and all were  answered. The patient agreed with the plan and demonstrated an understanding of the instructions.  Patient advised to call back or seek an in-person evaluation if the symptoms or condition worsens.  Duration of encounter: 30 minutes.  Note by: Gillis Santa, MD Date: 06/17/2020; Time: 12:23 PM

## 2020-06-19 ENCOUNTER — Other Ambulatory Visit: Payer: Self-pay

## 2020-06-19 ENCOUNTER — Telehealth (INDEPENDENT_AMBULATORY_CARE_PROVIDER_SITE_OTHER): Payer: No Typology Code available for payment source | Admitting: Psychiatry

## 2020-06-19 DIAGNOSIS — F331 Major depressive disorder, recurrent, moderate: Secondary | ICD-10-CM | POA: Diagnosis not present

## 2020-06-19 DIAGNOSIS — F411 Generalized anxiety disorder: Secondary | ICD-10-CM

## 2020-06-19 MED ORDER — FLUOXETINE HCL 20 MG PO CAPS: ORAL_CAPSULE | ORAL | 5 refills | Status: DC

## 2020-06-19 MED ORDER — QUETIAPINE FUMARATE 300 MG PO TABS: ORAL_TABLET | ORAL | 5 refills | Status: DC

## 2020-06-19 MED ORDER — ARIPIPRAZOLE 5 MG PO TABS: 5.0000 mg | ORAL_TABLET | Freq: Every day | ORAL | 5 refills | Status: DC

## 2020-06-19 MED ORDER — ZOLPIDEM TARTRATE 5 MG PO TABS: 5.0000 mg | ORAL_TABLET | Freq: Every day | ORAL | 5 refills | Status: DC

## 2020-06-19 NOTE — Progress Notes (Signed)
Virtual Visit via Telephone Note  I connected with [name] [name] on 06/19/20 at  1:00 PM EST by telephone and verified that I am speaking with the correct person using two identifiers.  Location: Patient: Home Provider: Hospital   I discussed the limitations, risks, security and privacy concerns of performing an evaluation and management service by telephone and the availability of in person appointments. I also discussed with the patient that there may be a patient responsible charge related to this service. The patient expressed understanding and agreed to proceed.   History of Present Illness: Patient reached by telephone.  She was on time and appropriate.  Patient has no new psychiatric complaints.  Mood is been stable.  We discussed medical problems she has suffered recently.  Coming through that without difficulty.  Currently sleeping well mood stable no mania no psychosis.  Denies suicidal or homicidal thought    Observations/Objective: Alert and oriented.  Affect reactive and appropriate mood stated as good.  Thoughts lucid no evidence of loosening of associations or delusions.   Assessment and Plan: Stable doing well tolerating medicine.  Reviewed medication plan.  Refilled medicine follow-up 6 months   Follow Up Instructions: Follow-up 6 months   I discussed the assessment and treatment plan with the patient. The patient was provided an opportunity to ask questions and all were answered. The patient agreed with the plan and demonstrated an understanding of the instructions.   The patient was advised to call back or seek an in-person evaluation if the symptoms worsen or if the condition fails to improve as anticipated.  I provided 20 minutes of non-face-to-face time during this encounter.   Mordecai Rasmussen, MD

## 2020-06-24 ENCOUNTER — Ambulatory Visit: Admit: 2020-06-24 | Discharge: 2020-06-25 | Payer: MEDICAID

## 2020-06-27 ENCOUNTER — Ambulatory Visit
Admit: 2020-06-27 | Discharge: 2020-06-28 | Payer: MEDICAID | Attending: Foot & Ankle Surgery | Primary: Foot & Ankle Surgery

## 2020-06-30 ENCOUNTER — Encounter: Admit: 2020-06-30 | Discharge: 2020-07-29 | Payer: MEDICAID

## 2020-07-02 ENCOUNTER — Ambulatory Visit: Admit: 2020-07-02 | Discharge: 2020-07-03 | Payer: MEDICAID

## 2020-07-02 DIAGNOSIS — E139 Other specified diabetes mellitus without complications: Principal | ICD-10-CM

## 2020-07-02 DIAGNOSIS — T148XXA Other injury of unspecified body region, initial encounter: Principal | ICD-10-CM

## 2020-07-09 ENCOUNTER — Ambulatory Visit: Admit: 2020-07-09 | Discharge: 2020-07-10 | Payer: MEDICAID

## 2020-07-09 MED ORDER — ZINC SULFATE 50 MG ZINC (220 MG) CAPSULE
ORAL_CAPSULE | 11 refills | 0 days
Start: 2020-07-09 — End: ?

## 2020-07-09 MED ORDER — PRAMIPEXOLE 0.125 MG TABLET
ORAL_TABLET | 11 refills | 0.00000 days
Start: 2020-07-09 — End: ?

## 2020-07-10 ENCOUNTER — Ambulatory Visit (INDEPENDENT_AMBULATORY_CARE_PROVIDER_SITE_OTHER): Payer: Medicaid Other | Admitting: Pulmonary Disease

## 2020-07-10 ENCOUNTER — Other Ambulatory Visit: Payer: Self-pay

## 2020-07-10 ENCOUNTER — Encounter: Payer: Self-pay | Admitting: Pulmonary Disease

## 2020-07-10 VITALS — BP 98/68 | HR 82 | Temp 97.2°F | Ht 66.0 in | Wt 304.0 lb

## 2020-07-10 DIAGNOSIS — I82401 Acute embolism and thrombosis of unspecified deep veins of right lower extremity: Secondary | ICD-10-CM

## 2020-07-10 DIAGNOSIS — G4733 Obstructive sleep apnea (adult) (pediatric): Secondary | ICD-10-CM

## 2020-07-10 DIAGNOSIS — J8410 Pulmonary fibrosis, unspecified: Secondary | ICD-10-CM | POA: Diagnosis not present

## 2020-07-10 DIAGNOSIS — J9611 Chronic respiratory failure with hypoxia: Secondary | ICD-10-CM | POA: Diagnosis not present

## 2020-07-10 DIAGNOSIS — Z6841 Body Mass Index (BMI) 40.0 and over, adult: Secondary | ICD-10-CM

## 2020-07-10 NOTE — Progress Notes (Signed)
Subjective:    Patient ID: [name] [name], female    DOB: [birth year], 60 y.o.   MRN: [id number]  HPI [name] [name] presents today as a follow-up from her visit of 8 February.  At that time she saw Tammy Parrett,NP.  I had last seen her on 2 February as a work in visit.  A CT scan of the chest and Dopplers of the lower extremities were obtained at that time for suspicion of DVT.  DVT was confirmed.  The patient was seen in the emergency room and she was started on Eliquis.  She has a very complex history with multiple problems as below.  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  She has issues with severe OSA however does not tolerate CPAP however, she has never tried BiPAP or AVAPS.  She would benefit from a titration study.   Review of Systems A 10 point review of systems was performed and it is as noted above otherwise negative.  Patient Active Problem List   Diagnosis Date Noted  . DVT (deep vein thrombosis) in pregnancy 05/27/2020  . DVT of popliteal vein (HCC) 05/27/2020  . 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 Chronic respiratory failure with hypoxia (HCC) 02/07/2020  . Anemia 02/07/2020  . Lower extremity cellulitis 01/10/2020  . Bacteremia   . Paroxysmal A-fib (HCC)   . Right arm weakness   . Ileus (HCC)   . Splenic infarct   . Acute metabolic encephalopathy 07/08/2019  . Hyperglycemia due to type 2 diabetes mellitus (HCC) 07/08/2019  . Morbid obesity with BMI of 50.0-59.9, adult (HCC) 07/08/2019  . Severe sepsis with septic shock (HCC) 07/08/2019  . Septic shock (HCC) 07/08/2019  . History of severe acute respiratory syndrome coronavirus 2 (SARS-CoV-2) disease 05/24/2019  . Acute respiratory disease due to COVID-19 virus 04/06/2019  . Type 2 diabetes mellitus without complication, without long-term  current use of insulin (HCC) 04/06/2019  . CKD (chronic kidney disease), stage III (HCC) 04/06/2019  . HLD (hyperlipidemia) 04/06/2019  . COVID-19 03/27/2019  . Pneumonia due to COVID-19 virus 03/27/2019  . Abdominal pain 02/12/2019  . AKI (acute kidney injury) (HCC) 02/12/2019  . Cellulitis, abdominal wall 02/04/2019  . Elevated C-reactive protein (CRP) 12/15/2018  . Elevated sed rate 12/15/2018  . Pharmacologic therapy 11/06/2018  . Disorder of skeletal system 11/06/2018  . Problems influencing health status 11/06/2018  . Decubitus ulcer of heel, bilateral 02/21/2018  . Diabetic ulcer of toe of right foot associated with type 2 diabetes mellitus (HCC) 02/14/2018  . Ulcer of left heel and midfoot with fat layer exposed (HCC) 02/14/2018  . Sepsis due to methicillin resistant Staphylococcus aureus (MRSA) without acute organ dysfunction (HCC) 11/17/2017  . MRSA (methicillin resistant Staphylococcus aureus) infection 11/17/2017  . Cellulitis 11/15/2017  . Perineal abscess 11/15/2017  . Subacute vulvitis 11/01/2017  . Chronic cystitis 03/01/2017  . Chronic pain syndrome 03/31/2016  . Insomnia secondary to chronic pain 03/31/2016  . Chronic upper back pain 12/25/2015  . Chronic hand pain (Bilateral) (L>R) 12/25/2015  . Rheumatoid arthritis (HCC) 12/25/2015  . Osteoarthritis, multiple sites 12/24/2015  . Chronic foot pain (Primary Area of Pain) (Bilateral) (L>R) 12/24/2015  . Chronic elbow pain (Third area of Pain) (Bilateral) (L>R) 12/24/2015  . Chronic shoulder pain (Bilateral) (L>R) 12/24/2015  . Chronic neck pain (Bilateral) (R>L) 12/24/2015  . Presence of functional  implant (Bladder stimulator/[employer]) 12/23/2015  . Chronic knee pain (Bilateral) (R>L) 12/23/2015  . Long term current use of opiate analgesic 12/23/2015  . Long term prescription opiate use 12/23/2015  . Opiate use (30 MME/Day) 12/23/2015  . Neurogenic pain 12/23/2015  . Neuropathic pain 12/23/2015  . Diabetic  peripheral neuropathy (HCC) 12/23/2015  . Encounter for therapeutic drug level monitoring 12/23/2015  . Encounter for pain management planning 12/23/2015  . GERD (gastroesophageal reflux disease) 11/25/2015  . Neuropathy 11/25/2015  . Hypokalemia due to loss of potassium 10/21/2015  . Hypomagnesemia 10/21/2015  . QT prolongation 10/21/2015  . 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 Chronic wrist pain (Secondary area of Pain) (Bilateral) (L>R) 03/19/2015  . 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 Female genuine stress incontinence 02/14/2015  . Urge incontinence of urine 02/14/2015  . OSA (obstructive sleep apnea) 07/03/2014  . Chronic diastolic CHF (congestive heart failure) (HCC) 07/03/2014  . Anxiety 01/03/2014  . Diabetic ulcer of heel (HCC) 01/03/2014  . COPD (chronic obstructive pulmonary disease) (HCC) 01/03/2014  . Bipolar disorder, unspecified (HCC) 01/03/2014  . Diastolic dysfunction 01/03/2014  . Combined fat and carbohydrate induced hyperlipemia 01/03/2014  . Shortness of breath 01/03/2014  . Acute on chronic congestive heart failure (HCC) 01/03/2014  . Incomplete bladder emptying 11/02/2012  . Bladder retention 10/10/2012  . Detrusor muscle hypertonia 10/04/2012  . Obstruction of urinary tract 10/04/2012  . FOM (frequency of micturition) 10/04/2012  . Mixed incontinence 10/04/2012  . Vitamin D insufficiency 04/15/2012  . Borderline personality disorder (HCC) 01/06/2012  . Anxiety and depression 01/06/2012  . History of laparoscopic adjustable gastric banding, 03/20/2007.  Removed 09/19/2011. 08/04/2011  . Nausea & vomiting 08/04/2011  . Hypothyroidism 06/28/2010  . Rheumatoid arthritis involving multiple sites with positive rheumatoid factor (HCC) 06/28/2010  . Obesity 03/29/2005  . Essential (primary) hypertension 01/27/2005  . Major depressive disorder, recurrent episode, moderate (HCC) 06/03/2004   Social History   Tobacco Use  . Smoking  status: Former Smoker    Packs/day: 2.00    Years: 27.00    Pack years: 54.00    Types: Cigarettes    Quit date: 07/30/1999    Years since quitting: 21.0  . Smokeless tobacco: Never Used  . Tobacco comment: quit in 2001 2-2.5 a day  Substance Use Topics  . Alcohol use: No   Allergies  Allergen Reactions  . Cephalexin Hives  . Codeine Palpitations, Nausea Only, Nausea And Vomiting, Rash and Shortness Of Breath    "makes heart fly, she gets flushed and passes out"  . Doxycycline Rash  . Propoxyphene Rash and Shortness Of Breath    Increase heart rate  . Sulfa Antibiotics Palpitations, Nausea Only, Shortness Of Breath and Hives    "makes heart fly, she gets flushed and passes out"  . Clindamycin Rash    Tongue swelling, oral sores, and Mouth rash  . Lovenox [Enoxaparin Sodium] Hives  . Hydrocodone Nausea And Vomiting    Hear racing & breaks out into a cold sweat.  . Meropenem Rash    Erythematous, hot, pruritic rash over arms, chest, back, abdomen, and face occurred at the end of meropenem infusion on 02/22/18   Current Meds  Medication Sig  . acetaminophen (TYLENOL) 325 MG tablet Take 2 tablets (650 mg total) by mouth every 6 (six) hours as needed for mild pain or moderate pain (or Fever >/= 101).  Marland Kitchen ALPRAZolam (XANAX) 0.5 MG tablet Take 1 tablet (0.5 mg total) by mouth 2 (  two) times daily as needed for anxiety.  Marland Kitchen apixaban (ELIQUIS) 5 MG TABS tablet Take 1 tablet (5 mg total) by mouth 2 (two) times daily.  . ARIPiprazole (ABILIFY) 5 MG tablet Take 1 tablet (5 mg total) by mouth daily.  Marland Kitchen ascorbic acid (VITAMIN C) 500 MG tablet Take 1 tablet (500 mg total) by mouth daily.  Marland Kitchen atorvastatin (LIPITOR) 80 MG tablet Take 80 mg by mouth daily.   . budesonide (PULMICORT) 0.5 MG/2ML nebulizer solution Take 2 mLs (0.5 mg total) by nebulization 2 (two) times daily.  . calcium citrate-vitamin D 500-500 MG-UNIT chewable tablet Chew 0.5 tablets by mouth daily.  . Cholecalciferol (VITAMIN D) 125  MCG (5000 UT) CAPS Take 1 capsule by mouth daily.  . famotidine (PEPCID) 20 MG tablet Take 1 tablet (20 mg total) by mouth daily.  Marland Kitchen FLUoxetine (PROZAC) 20 MG capsule TAKE (1) CAPSULE BY MOUTH ONCE DAILY.  . folic acid (FOLVITE) 1 MG tablet Take 1 mg by mouth daily.   . furosemide (LASIX) 20 MG tablet TAKE (1) TABLET BY MOUTH DAILY AS NEEDED.  Marland Kitchen insulin lispro (HUMALOG) 100 UNIT/ML injection Inject 0.08 mLs (8 Units total) into the skin 3 (three) times daily before meals.  Marland Kitchen ipratropium-albuterol (DUONEB) 0.5-2.5 (3) MG/3ML SOLN Take 3 mLs by nebulization 3 (three) times daily.  Marland Kitchen levothyroxine (SYNTHROID, LEVOTHROID) 88 MCG tablet Take 88 mcg by mouth daily before breakfast.  . metFORMIN (GLUCOPHAGE-XR) 500 MG 24 hr tablet Take 500 mg by mouth daily.  . mupirocin ointment (BACTROBAN) 2 % Apply 1 application topically 2 (two) times daily. Intranasal - to affected area.  . [EXPIRED] oxyCODONE-acetaminophen (PERCOCET) 5-325 MG tablet Take 1 tablet by mouth every 6 (six) hours as needed for severe pain. Must last 30 days.  Marland Kitchen oxyCODONE-acetaminophen (PERCOCET) 5-325 MG tablet Take 1 tablet by mouth every 6 (six) hours as needed for severe pain. Must last 30 days.  Melene Muller ON 08/16/2020] oxyCODONE-acetaminophen (PERCOCET) 5-325 MG tablet Take 1 tablet by mouth every 6 (six) hours as needed for severe pain. Must last 30 days.  . polyethylene glycol (MIRALAX / GLYCOLAX) 17 g packet Take 17 g by mouth daily as needed for mild constipation.  . pramipexole (MIRAPEX) 0.125 MG tablet Take 0.25 mg by mouth at bedtime.   . pregabalin (LYRICA) 150 MG capsule Take 1 capsule (150 mg total) by mouth 3 (three) times daily.  . QUEtiapine (SEROQUEL) 300 MG tablet TAKE ONE TABLET BY MOUTH AT BEDTIME.  Marland Kitchen solifenacin (VESICARE) 5 MG tablet Take 5 mg by mouth daily.  . TRADJENTA 5 MG TABS tablet Take 5 mg by mouth daily.  . vancomycin IVPB Inject 500 mg into the vein. Unknown dose.  Given at home via picc line  . zinc  sulfate 220 (50 Zn) MG capsule Take 220 mg by mouth at bedtime.   Marland Kitchen zolpidem (AMBIEN) 5 MG tablet Take 1 tablet (5 mg total) by mouth at bedtime.   Immunization History  Administered Date(s) Administered  . Influenza, Seasonal, Injecte, Preservative Fre 02/04/2005  . Influenza,inj,Quad PF,6+ Mos 04/14/2015, 03/23/2017, 02/07/2018, 02/01/2019, 02/07/2020  . Influenza-Unspecified 02/02/2019  . PFIZER(Purple Top)SARS-COV-2 Vaccination 03/18/2020, 04/08/2020, 06/03/2020  . Pneumococcal Conjugate-13 04/22/2014  . Pneumococcal Polysaccharide-23 03/23/2017, 04/16/2019        Objective:   Physical Exam BP 98/68 (BP Location: Right Arm, Patient Position: Sitting, Cuff Size: Normal)   Pulse 82   Temp (!) 97.2 F (36.2 C) (Temporal)   Ht 5\' 6"  (1.676 m)  Wt (!) 304 lb (137.9 kg)   LMP 04/20/2001   SpO2 99%   BMI 49.07 kg/m Initial O2 sat on room air was 83%, on 2 L patient is 99%. GENERAL: Morbidly obese woman, presents in transport chair.  No respiratory distress.  Comfortable with nasal cannula O2 oxygen on.  No conversational dyspnea. HEAD: Normocephalic, atraumatic.  EYES: Pupils equal, round, reactive to light.  No scleral icterus.  MOUTH: Nose/mouth/throat not examined due to masking requirements for COVID 19. NECK: Supple. No thyromegaly. Trachea midline. No JVD.  No adenopathy. PULMONARY: Good air entry bilaterally.  No adventitious sounds.  CARDIOVASCULAR: S1 and S2. Regular rate and rhythm.  No rubs, murmurs or gallops heard. ABDOMEN: Obese, soft, nondistended. MUSCULOSKELETAL: RA changes both hands.  No clubbing or edema noted, no increased warmth in the lower extremities.  Left foot in protective shoe and with dressing, not removed. NEUROLOGIC: No overt focal deficit, speech is fluent. SKIN: Intact,warm,dry.  Chronic stasis changes lower extremities.   PSYCH: Flat affect.  No psychomotor retardation.  Results of the Epworth flowsheet 07/10/2020  Sitting and reading 3   Watching TV 3  Sitting, inactive in a public place (e.g. a theatre or a meeting) 0  As a passenger in a car for an hour without a break 1  Lying down to rest in the afternoon when circumstances permit 3  Sitting and talking to someone 0  Sitting quietly after a lunch without alcohol 0  In a car, while stopped for a few minutes in traffic 0  Total score 10        Assessment & Plan:     ICD-10-CM   1. Postinflammatory pulmonary fibrosis (HCC)  J84.10    Post COVID-19 Continue pulmonary toilet and current regimen  2. Chronic respiratory failure with hypoxia (HCC)  J96.11    Continue oxygen as prescribed  3. Acute deep vein thrombosis (DVT) of right lower extremity, unspecified vein (HCC)  I82.401    On Eliquis I am less optimistic that she will be able to come off anticoagulants  4. OSA (obstructive sleep apnea)  G47.33 Split night study   She needs to be reevaluated Does have issues with tolerance of CPAP May need BiPAP/AVAPS Prefers nasal pillows Recommend titration during study  5. Morbid obesity with BMI of 50.0-59.9, adult (HCC)  E66.01    Z68.43    Weight loss is recommended    Orders Placed This Encounter  Procedures  . Split night study    Patient prefers pillows    Standing Status:   Future    Standing Expiration Date:   07/10/2021    Order Specific Question:   Where should this test be performed:    Answer:   [facility]   Discussion:  Patient needs to be reassessed for sleep apnea.  Given her other medical comorbidities management of OSA is going to be very important.  Her Epworth scale is 10.  She has been referred for split-night study.  She needs to continue wearing oxygen for her chronic respiratory failure with hypoxia.  We will see her in follow-up in 3 months time she is to contact us prior to that time should any new difficulties arise.  We will notify her of the results of the sleep study when available.   Gailen Shelter, MD [facility]   *This  note was dictated using voice recognition software/Dragon.  Despite best efforts to proofread, errors can occur which can change the meaning.  Any change  was purely unintentional.

## 2020-07-10 NOTE — Patient Instructions (Signed)
We are going to arrange for a sleep study   Continue using your oxygen   We will see you in follow-up in 3 months time, we will call you with the results of the sleep study

## 2020-07-14 ENCOUNTER — Ambulatory Visit: Admit: 2020-07-14 | Discharge: 2020-07-15 | Payer: MEDICAID

## 2020-07-14 DIAGNOSIS — M869 Osteomyelitis, unspecified: Principal | ICD-10-CM

## 2020-07-21 ENCOUNTER — Telehealth: Payer: Self-pay | Admitting: Pulmonary Disease

## 2020-07-21 NOTE — Telephone Encounter (Signed)
Patient is aware of below recommendations. She voiced her understanding and had no further questions.  Nothing further needed at this time.  

## 2020-07-21 NOTE — Telephone Encounter (Signed)
I recommend that she go to the ED for evaluation.  Looks like she may need admission.

## 2020-07-21 NOTE — Telephone Encounter (Addendum)
Spoke to patient, who reports increased sob with rest and talking. She is unable to complete a sentence without stopping to catch her breathe.  spo2 has dropped as low as 84-85% on 3L at rest. spo2 recovers to 94% with purse lip breathing. Patient is in a wheel chair due to broken ankle.  Denied cough, wheezing, fever, chills or sweats. She has misplaced Rx for duoneb. She does not have albuterol HFA. Taking Eliquis BID.  Dr. Jayme Cloud, please advise. Thanks

## 2020-07-23 ENCOUNTER — Ambulatory Visit
Admit: 2020-07-23 | Discharge: 2020-07-24 | Payer: MEDICAID | Attending: Foot & Ankle Surgery | Primary: Foot & Ankle Surgery

## 2020-07-23 DIAGNOSIS — E11621 Type 2 diabetes mellitus with foot ulcer: Principal | ICD-10-CM

## 2020-07-23 DIAGNOSIS — M79675 Pain in left toe(s): Principal | ICD-10-CM

## 2020-07-23 DIAGNOSIS — Z9889 Other specified postprocedural states: Principal | ICD-10-CM

## 2020-07-23 DIAGNOSIS — M79674 Pain in right toe(s): Principal | ICD-10-CM

## 2020-07-23 DIAGNOSIS — Z89419 Acquired absence of unspecified great toe: Principal | ICD-10-CM

## 2020-07-23 DIAGNOSIS — L97422 Non-pressure chronic ulcer of left heel and midfoot with fat layer exposed: Principal | ICD-10-CM

## 2020-07-28 ENCOUNTER — Encounter: Payer: Self-pay | Admitting: Pulmonary Disease

## 2020-07-29 ENCOUNTER — Emergency Department
Admission: EM | Admit: 2020-07-29 | Discharge: 2020-07-29 | Disposition: A | Discharge status: Home / Self Care | Payer: Medicaid Other | Attending: Emergency Medicine | Admitting: Emergency Medicine

## 2020-07-29 ENCOUNTER — Other Ambulatory Visit: Payer: Self-pay

## 2020-07-29 ENCOUNTER — Emergency Department: Payer: Medicaid Other

## 2020-07-29 ENCOUNTER — Encounter: Payer: Self-pay | Admitting: Emergency Medicine

## 2020-07-29 ENCOUNTER — Telehealth: Payer: Self-pay | Admitting: Pulmonary Disease

## 2020-07-29 DIAGNOSIS — E11621 Type 2 diabetes mellitus with foot ulcer: Secondary | ICD-10-CM | POA: Diagnosis not present

## 2020-07-29 DIAGNOSIS — Z7984 Long term (current) use of oral hypoglycemic drugs: Secondary | ICD-10-CM | POA: Insufficient documentation

## 2020-07-29 DIAGNOSIS — Z794 Long term (current) use of insulin: Secondary | ICD-10-CM | POA: Diagnosis not present

## 2020-07-29 DIAGNOSIS — Z8616 Personal history of COVID-19: Secondary | ICD-10-CM | POA: Insufficient documentation

## 2020-07-29 DIAGNOSIS — Z7951 Long term (current) use of inhaled steroids: Secondary | ICD-10-CM | POA: Insufficient documentation

## 2020-07-29 DIAGNOSIS — Z87891 Personal history of nicotine dependence: Secondary | ICD-10-CM | POA: Insufficient documentation

## 2020-07-29 DIAGNOSIS — E1165 Type 2 diabetes mellitus with hyperglycemia: Secondary | ICD-10-CM | POA: Insufficient documentation

## 2020-07-29 DIAGNOSIS — E1140 Type 2 diabetes mellitus with diabetic neuropathy, unspecified: Secondary | ICD-10-CM | POA: Insufficient documentation

## 2020-07-29 DIAGNOSIS — J449 Chronic obstructive pulmonary disease, unspecified: Secondary | ICD-10-CM | POA: Diagnosis not present

## 2020-07-29 DIAGNOSIS — Z7901 Long term (current) use of anticoagulants: Secondary | ICD-10-CM | POA: Insufficient documentation

## 2020-07-29 DIAGNOSIS — I5032 Chronic diastolic (congestive) heart failure: Secondary | ICD-10-CM | POA: Diagnosis not present

## 2020-07-29 DIAGNOSIS — R0602 Shortness of breath: Secondary | ICD-10-CM | POA: Diagnosis present

## 2020-07-29 DIAGNOSIS — N183 Chronic kidney disease, stage 3 unspecified: Secondary | ICD-10-CM | POA: Insufficient documentation

## 2020-07-29 DIAGNOSIS — J189 Pneumonia, unspecified organism: Secondary | ICD-10-CM | POA: Insufficient documentation

## 2020-07-29 DIAGNOSIS — Z79899 Other long term (current) drug therapy: Secondary | ICD-10-CM | POA: Insufficient documentation

## 2020-07-29 DIAGNOSIS — L97519 Non-pressure chronic ulcer of other part of right foot with unspecified severity: Secondary | ICD-10-CM | POA: Diagnosis not present

## 2020-07-29 DIAGNOSIS — E039 Hypothyroidism, unspecified: Secondary | ICD-10-CM | POA: Insufficient documentation

## 2020-07-29 DIAGNOSIS — I130 Hypertensive heart and chronic kidney disease with heart failure and stage 1 through stage 4 chronic kidney disease, or unspecified chronic kidney disease: Secondary | ICD-10-CM | POA: Insufficient documentation

## 2020-07-29 DIAGNOSIS — J9611 Chronic respiratory failure with hypoxia: Secondary | ICD-10-CM

## 2020-07-29 LAB — URINALYSIS, COMPLETE (UACMP) WITH MICROSCOPIC
Bilirubin Urine: NEGATIVE
Glucose, UA: NEGATIVE mg/dL
Ketones, ur: NEGATIVE mg/dL
Nitrite: POSITIVE — AB
Protein, ur: NEGATIVE mg/dL
Specific Gravity, Urine: 1.011 (ref 1.005–1.030)
Squamous Epithelial / HPF: NONE SEEN (ref 0–5)
pH: 7 (ref 5.0–8.0)

## 2020-07-29 LAB — BASIC METABOLIC PANEL
Anion gap: 9 (ref 5–15)
BUN: 15 mg/dL (ref 6–20)
CO2: 29 mmol/L (ref 22–32)
Calcium: 9.2 mg/dL (ref 8.9–10.3)
Chloride: 100 mmol/L (ref 98–111)
Creatinine, Ser: 0.83 mg/dL (ref 0.44–1.00)
GFR, Estimated: >60 mL/min (ref >60)
Glucose, Bld: 139 mg/dL — ABNORMAL HIGH (ref 70–99)
Potassium: 3.7 mmol/L (ref 3.5–5.1)
Sodium: 138 mmol/L (ref 135–145)

## 2020-07-29 LAB — CBC WITH DIFFERENTIAL/PLATELET
Abs Immature Granulocytes: 0.05 10*3/uL (ref 0.00–0.07)
Basophils Absolute: 0.1 10*3/uL (ref 0.0–0.1)
Basophils Relative: 1 %
Eosinophils Absolute: 0 10*3/uL (ref 0.0–0.5)
Eosinophils Relative: 0 %
HCT: 31.9 % — ABNORMAL LOW (ref 36.0–46.0)
Hemoglobin: 9.5 g/dL — ABNORMAL LOW (ref 12.0–15.0)
Immature Granulocytes: 1 %
Lymphocytes Relative: 17 %
Lymphs Abs: 1.2 10*3/uL (ref 0.7–4.0)
MCH: 21.2 pg — ABNORMAL LOW (ref 26.0–34.0)
MCHC: 29.8 g/dL — ABNORMAL LOW (ref 30.0–36.0)
MCV: 71 fL — ABNORMAL LOW (ref 80.0–100.0)
Monocytes Absolute: 0.4 10*3/uL (ref 0.1–1.0)
Monocytes Relative: 6 %
Neutro Abs: 5.4 10*3/uL (ref 1.7–7.7)
Neutrophils Relative %: 75 %
Platelets: 227 10*3/uL (ref 150–400)
RBC: 4.49 MIL/uL (ref 3.87–5.11)
RDW: 17.4 % — ABNORMAL HIGH (ref 11.5–15.5)
WBC: 7.1 10*3/uL (ref 4.0–10.5)
nRBC: 0 % (ref 0.0–0.2)

## 2020-07-29 LAB — D-DIMER, QUANTITATIVE: D-Dimer, Quant: 0.4 ug/mL-FEU (ref 0.00–0.50)

## 2020-07-29 LAB — BRAIN NATRIURETIC PEPTIDE: B Natriuretic Peptide: 9.7 pg/mL (ref 0.0–100.0)

## 2020-07-29 MED ORDER — AMOXICILLIN-POT CLAVULANATE 875-125 MG PO TABS: 1.0000 | ORAL_TABLET | Freq: Two times a day (BID) | ORAL | 0 refills | Status: AC

## 2020-07-29 MED ORDER — AMOXICILLIN-POT CLAVULANATE 875-125 MG PO TABS
1.0000 | ORAL_TABLET | Freq: Once | ORAL | Status: AC
  Administered 2020-07-29: 1 via ORAL
  Filled 2020-07-29: qty 1

## 2020-07-29 MED ORDER — OXYCODONE-ACETAMINOPHEN 5-325 MG PO TABS
1.0000 | ORAL_TABLET | Freq: Once | ORAL | Status: AC
  Administered 2020-07-29: 1 via ORAL
  Filled 2020-07-29: qty 1

## 2020-07-29 MED ORDER — IOHEXOL 350 MG/ML SOLN
100.0000 mL | Freq: Once | INTRAVENOUS | Status: AC | PRN
  Administered 2020-07-29: 100 mL via INTRAVENOUS

## 2020-07-29 MED ORDER — AMOXICILLIN 875 MG-POTASSIUM CLAVULANATE 125 MG TABLET
ORAL | 0 days
Start: 2020-07-29 — End: 2020-08-08

## 2020-07-29 NOTE — Telephone Encounter (Signed)
Patient is aware of recommendations and voiced her understanding.  She will present to ED.  Order has been placed to adapt for full face mask.  Nothing further needed at this time.

## 2020-07-29 NOTE — Telephone Encounter (Signed)
Called and spoke to patient.  Patient stated that her oxygen has dropped as low as 69% on 4L when getting into bed and laying down.spo2 recovers to 90% after . Sx have worsen over the past month.  spo2 95% on 4L during our conversation. C/o increased sob with mild exertion. Patient is unable to ambulate more then 2 steps, due to broken ankle. Denied fever, chills, sweats, cough or wheezing.  Using Pulmicort BID without improvement.  She is fully vaccinated against covid and flu. Patient is questioning if a mask would work better for her vs nasal cannula, due to being a mouth breather.   Beth, please advise. Thanks

## 2020-07-29 NOTE — Telephone Encounter (Signed)
Recommend ED evaluation, she is at risk for blood clot due to immobility. O2 level in 60-70s is not safe. Will need chest imaging and labs. We can place an order for full face mask for oxygen.

## 2020-07-29 NOTE — Discharge Instructions (Addendum)
Take prescription antibiotic as directed.  Follow with your primary provider for ongoing symptoms peer return to the ED if necessary.

## 2020-07-29 NOTE — ED Triage Notes (Signed)
Pt to ED via POV with c/o increasing SOB, pt on 4L chronic O2, pt states is pulse ox 69% on 4L via [place] with movement. Per family member pt with covid lung from previous dx of Covid 32. Pt states increasing SOB that started earlier today.

## 2020-07-29 NOTE — ED Provider Notes (Signed)
[facility] Emergency Department Provider Note   ____________________________________________   Event Date/Time   First MD Initiated Contact with Patient 07/29/20 1635     (approximate)  I have reviewed the triage vital signs and the nursing notes.   HISTORY  Chief Complaint Shortness of Breath  HPI [name] [name] is a 60 y.o. female with the below medical history, presents to the ED accompanied by family, for evaluation of shortness of breath.  Patient is on O2 at home by nasal cannula, but reported some increase dyspnea today.  She notes that her adult daughter, increased her oxygen to 4 L per [place], and she has some improvement of her symptoms.  Patient denies any interim fever, chills, or sweats.  She denies any history of PEs, but did have Covid back in 2020.  Past Medical History:  Diagnosis Date  . Abdominal wall hernia 01/29/2013  . Anxiety   . Arthritis    Rheumatoid  . C. difficile colitis   . Chronic diastolic heart failure (HCC)   . COVID-19 03/23/2019   Diagnosed at [facility] (send-out) on 03/23/2019  . Depression   . Diabetes mellitus    states no meds or diet restrictions  at present  . Diastolic CHF (HCC)   . Esophagitis   . Fluid retention   . GERD (gastroesophageal reflux disease)   . Hiatal hernia   . Hypertension   . Hypokalemia due to loss of potassium 10/21/2015   Overview:  Associated with 3 weeks of diarrhea  And QT prolongation.  . Hypothyroidism   . IBS (irritable bowel syndrome)   . Moderate episode of recurrent major depressive disorder (HCC) 06/03/2004  . Morbid obesity (HCC)   . MRSA (methicillin resistant Staphylococcus aureus) infection 11/2017   left inner thigh abcess  . Neurogenic bladder    has pacemaker  . Neuropathy   . Obesity   . Panic attacks   . Pneumonia due to COVID-19 virus   . Rheumatoid arthritis (HCC)   . Sleep apnea    STATES SEVERE, CANT TOLERATE MASK- LAST STUDY YEARS AGO    Patient Active  Problem List   Diagnosis Date Noted  . DVT (deep vein thrombosis) in pregnancy 05/27/2020  . DVT of popliteal vein (HCC) 05/27/2020  . 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 Chronic respiratory failure with hypoxia (HCC) 02/07/2020  . Anemia 02/07/2020  . Lower extremity cellulitis 01/10/2020  . Bacteremia   . Paroxysmal A-fib (HCC)   . Right arm weakness   . Ileus (HCC)   . Splenic infarct   . Acute metabolic encephalopathy 07/08/2019  . Hyperglycemia due to type 2 diabetes mellitus (HCC) 07/08/2019  . Morbid obesity with BMI of 50.0-59.9, adult (HCC) 07/08/2019  . Severe sepsis with septic shock (HCC) 07/08/2019  . Septic shock (HCC) 07/08/2019  . History of severe acute respiratory syndrome coronavirus 2 (SARS-CoV-2) disease 05/24/2019  . Acute respiratory disease due to COVID-19 virus 04/06/2019  . Type 2 diabetes mellitus without complication, without long-term current use of insulin (HCC) 04/06/2019  . CKD (chronic kidney disease), stage III (HCC) 04/06/2019  . HLD (hyperlipidemia) 04/06/2019  . COVID-19 03/27/2019  . Pneumonia due to COVID-19 virus 03/27/2019  . Abdominal pain 02/12/2019  . AKI (acute kidney injury) (HCC) 02/12/2019  . Cellulitis, abdominal wall 02/04/2019  . Elevated C-reactive protein (CRP) 12/15/2018  . Elevated sed rate 12/15/2018  . Pharmacologic therapy 11/06/2018  . Disorder of skeletal system 11/06/2018  . Problems influencing  health status 11/06/2018  . Decubitus ulcer of heel, bilateral 02/21/2018  . Diabetic ulcer of toe of right foot associated with type 2 diabetes mellitus (HCC) 02/14/2018  . Ulcer of left heel and midfoot with fat layer exposed (HCC) 02/14/2018  . Sepsis due to methicillin resistant Staphylococcus aureus (MRSA) without acute organ dysfunction (HCC) 11/17/2017  . MRSA (methicillin resistant Staphylococcus aureus) infection 11/17/2017  . Cellulitis 11/15/2017  . Perineal abscess 11/15/2017  . Subacute  vulvitis 11/01/2017  . Chronic cystitis 03/01/2017  . Chronic pain syndrome 03/31/2016  . Insomnia secondary to chronic pain 03/31/2016  . Chronic upper back pain 12/25/2015  . Chronic hand pain (Bilateral) (L>R) 12/25/2015  . Rheumatoid arthritis (HCC) 12/25/2015  . Osteoarthritis, multiple sites 12/24/2015  . Chronic foot pain (Primary Area of Pain) (Bilateral) (L>R) 12/24/2015  . Chronic elbow pain (Third area of Pain) (Bilateral) (L>R) 12/24/2015  . Chronic shoulder pain (Bilateral) (L>R) 12/24/2015  . Chronic neck pain (Bilateral) (R>L) 12/24/2015  . 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[employer]) 12/23/2015  . Chronic knee pain (Bilateral) (R>L) 12/23/2015  . Long term current use of opiate analgesic 12/23/2015  . Long term prescription opiate use 12/23/2015  . Opiate use (30 MME/Day) 12/23/2015  . Neurogenic pain 12/23/2015  . Neuropathic pain 12/23/2015  . Diabetic peripheral neuropathy (HCC) 12/23/2015  . Encounter for therapeutic drug level monitoring 12/23/2015  . Encounter for pain management planning 12/23/2015  . GERD (gastroesophageal reflux disease) 11/25/2015  . Neuropathy 11/25/2015  . Hypokalemia due to loss of potassium 10/21/2015  . Hypomagnesemia 10/21/2015  . QT prolongation 10/21/2015  . 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 Chronic wrist pain (Secondary area of Pain) (Bilateral) (L>R) 03/19/2015  . 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 Female genuine stress incontinence 02/14/2015  . Urge incontinence of urine 02/14/2015  . OSA (obstructive sleep apnea) 07/03/2014  . Chronic diastolic CHF (congestive heart failure) (HCC) 07/03/2014  . Anxiety 01/03/2014  . Diabetic ulcer of heel (HCC) 01/03/2014  . COPD (chronic obstructive pulmonary disease) (HCC) 01/03/2014  . Bipolar disorder, unspecified (HCC) 01/03/2014  . Diastolic dysfunction 01/03/2014  . Combined fat and carbohydrate induced hyperlipemia 01/03/2014  .  Shortness of breath 01/03/2014  . Acute on chronic congestive heart failure (HCC) 01/03/2014  . Incomplete bladder emptying 11/02/2012  . Bladder retention 10/10/2012  . Detrusor muscle hypertonia 10/04/2012  . Obstruction of urinary tract 10/04/2012  . FOM (frequency of micturition) 10/04/2012  . Mixed incontinence 10/04/2012  . Vitamin D insufficiency 04/15/2012  . Borderline personality disorder (HCC) 01/06/2012  . Anxiety and depression 01/06/2012  . History of laparoscopic adjustable gastric banding, 03/20/2007.  Removed 09/19/2011. 08/04/2011  . Nausea & vomiting 08/04/2011  . Hypothyroidism 06/28/2010  . Rheumatoid arthritis involving multiple sites with positive rheumatoid factor (HCC) 06/28/2010  . Obesity 03/29/2005  . Essential (primary) hypertension 01/27/2005  . Major depressive disorder, recurrent episode, moderate (HCC) 06/03/2004    Past Surgical History:  Procedure Laterality Date  . ABDOMINAL HYSTERECTOMY    . CHOLECYSTECTOMY    . DG GREAT TOE RIGHT FOOT  02/23/2018  . EYE SURGERY     bilateral cataract extraction with IOL  . HERNIA REPAIR     ventral hernia with strangulation  . IRRIGATION AND DEBRIDEMENT FOOT Left 01/11/2020   Procedure: IRRIGATION AND DEBRIDEMENT FOOT;  Surgeon: Gwyneth Revels, DPM;  Location: [facility] ORS;  Service: Podiatry;  Laterality: Left;  . LAPAROSCOPIC GASTRIC BANDING  03/20/07  . TEE WITHOUT CARDIOVERSION N/A  07/16/2019   Procedure: TRANSESOPHAGEAL ECHOCARDIOGRAM (TEE);  Surgeon: Debbe Odea, MD;  Location: [facility] ORS;  Service: Cardiovascular;  Laterality: N/A;  . TONSILLECTOMY    . TUBAL LIGATION      Prior to Admission medications   Medication Sig Start Date End Date Taking? Authorizing Provider  acetaminophen (TYLENOL) 325 MG tablet Take 2 tablets (650 mg total) by mouth every 6 (six) hours as needed for mild pain or moderate pain (or Fever >/= 101). 01/15/20   Sheikh, Kateri Mc Latif, DO  ALPRAZolam Prudy Feeler) 0.5 MG tablet Take 1  tablet (0.5 mg total) by mouth 2 (two) times daily as needed for anxiety. 07/17/19   Alford Highland, MD  apixaban (ELIQUIS) 5 MG TABS tablet Take 1 tablet (5 mg total) by mouth 2 (two) times daily. 05/27/20   Parrett, Virgel Bouquet, NP  ARIPiprazole (ABILIFY) 5 MG tablet Take 1 tablet (5 mg total) by mouth daily. 06/19/20   Clapacs, Jackquline Denmark, MD  ascorbic acid (VITAMIN C) 500 MG tablet Take 1 tablet (500 mg total) by mouth daily. 05/05/19   Rolly Salter, MD  atorvastatin (LIPITOR) 80 MG tablet Take 80 mg by mouth daily.  12/05/19 12/04/20  [provider]  budesonide (PULMICORT) 0.5 MG/2ML nebulizer solution Take 2 mLs (0.5 mg total) by nebulization 2 (two) times daily. 05/27/20 05/27/21  Parrett, Virgel Bouquet, NP  calcium citrate-vitamin D 500-500 MG-UNIT chewable tablet Chew 0.5 tablets by mouth daily. 01/16/20   Marguerita Merles Latif, DO  Cholecalciferol (VITAMIN D) 125 MCG (5000 UT) CAPS Take 1 capsule by mouth daily. 11/29/19   [provider]  famotidine (PEPCID) 20 MG tablet Take 1 tablet (20 mg total) by mouth daily. 05/05/19   Rolly Salter, MD  FLUoxetine (PROZAC) 20 MG capsule TAKE (1) CAPSULE BY MOUTH ONCE DAILY. 06/19/20   Clapacs, Jackquline Denmark, MD  folic acid (FOLVITE) 1 MG tablet Take 1 mg by mouth daily.     [provider]  furosemide (LASIX) 20 MG tablet TAKE (1) TABLET BY MOUTH DAILY AS NEEDED. 09/05/19   Parrett, Tammy S, NP  insulin lispro (HUMALOG) 100 UNIT/ML injection Inject 0.08 mLs (8 Units total) into the skin 3 (three) times daily before meals. 07/17/19   Wieting, Richard, MD  ipratropium-albuterol (DUONEB) 0.5-2.5 (3) MG/3ML SOLN Take 3 mLs by nebulization 3 (three) times daily. 05/04/19   Rolly Salter, MD  levothyroxine (SYNTHROID, LEVOTHROID) 88 MCG tablet Take 88 mcg by mouth daily before breakfast.    [provider]  metFORMIN (GLUCOPHAGE-XR) 500 MG 24 hr tablet Take 500 mg by mouth daily. 12/31/19   [provider]  mupirocin ointment (BACTROBAN) 2 %  Apply 1 application topically 2 (two) times daily. Intranasal - to affected area. 11/28/19   Tommie Sams, DO  oxyCODONE-acetaminophen (PERCOCET) 5-325 MG tablet Take 1 tablet by mouth every 6 (six) hours as needed for severe pain. Must last 30 days. 07/17/20 08/16/20  Edward Jolly, MD  oxyCODONE-acetaminophen (PERCOCET) 5-325 MG tablet Take 1 tablet by mouth every 6 (six) hours as needed for severe pain. Must last 30 days. 08/16/20 09/15/20  Edward Jolly, MD  polyethylene glycol (MIRALAX / GLYCOLAX) 17 g packet Take 17 g by mouth daily as needed for mild constipation. 05/04/19   Rolly Salter, MD  pramipexole (MIRAPEX) 0.125 MG tablet Take 0.25 mg by mouth at bedtime.     [provider]  pregabalin (LYRICA) 150 MG capsule Take 1 capsule (150 mg total) by mouth 3 (  three) times daily. 06/17/20 12/14/20  Edward Jolly, MD  QUEtiapine (SEROQUEL) 300 MG tablet TAKE ONE TABLET BY MOUTH AT BEDTIME. 06/19/20   Clapacs, Jackquline Denmark, MD  solifenacin (VESICARE) 5 MG tablet Take 5 mg by mouth daily. 12/31/19   [provider]  TRADJENTA 5 MG TABS tablet Take 5 mg by mouth daily. 12/31/19   [provider]  vancomycin IVPB Inject 500 mg into the vein. Unknown dose.  Given at home via picc line    [provider]  zinc sulfate 220 (50 Zn) MG capsule Take 220 mg by mouth at bedtime.  12/31/19   [provider]  zolpidem (AMBIEN) 5 MG tablet Take 1 tablet (5 mg total) by mouth at bedtime. 06/19/20   Clapacs, Jackquline Denmark, MD    Allergies Cephalexin, Codeine, Doxycycline, Propoxyphene, Sulfa antibiotics, Clindamycin, Lovenox [enoxaparin sodium], Hydrocodone, and Meropenem  Family History  Problem Relation Age of Onset  . Heart failure Father   . Bipolar disorder Father   . Alcohol abuse Father   . Anxiety disorder Father   . Depression Father   . Heart disease Brother   . Heart attack Brother 60       MI s/p stents placed  . Anxiety disorder Sister   . Depression Sister   .  Anxiety disorder Sister   . Depression Sister   . Bipolar disorder Sister   . Alcohol abuse Sister   . Drug abuse Sister   . Heart attack Brother     Social History Social History   Tobacco Use  . Smoking status: Former Smoker    Packs/day: 2.00    Years: 27.00    Pack years: 54.00    Types: Cigarettes    Quit date: 07/30/1999    Years since quitting: 21.0  . Smokeless tobacco: Never Used  . Tobacco comment: quit in 2001 2-2.5 a day  Vaping Use  . Vaping Use: Never used  Substance Use Topics  . Alcohol use: No  . Drug use: No    Review of Systems  Constitutional: No fever/chills Eyes: No visual changes. ENT: No sore throat. Cardiovascular: Denies chest pain. Respiratory: Positive for shortness of breath. Gastrointestinal: No abdominal pain.  No nausea, no vomiting.  No diarrhea.  No constipation. Genitourinary: Negative for dysuria. Musculoskeletal: Negative for back pain. Skin: Negative for rash. Neurological: Negative for headaches, focal weakness or numbness.  ____________________________________________   PHYSICAL EXAM:  VITAL SIGNS: ED Triage Vitals  Enc Vitals Group     BP 07/29/20 1600 122/75     Pulse Rate 07/29/20 1600 87     Resp 07/29/20 1600 20     Temp 07/29/20 1600 97.7 F (36.5 C)     Temp Source 07/29/20 1600 Oral     SpO2 07/29/20 1600 100 %     Weight --      Height --      Head Circumference --      Peak Flow --      Pain Score 07/29/20 1557 6     Pain Loc --      Pain Edu? --      Excl. in GC? --    Constitutional: Alert and oriented. Well appearing and in no acute distress. Eyes: Conjunctivae are normal. PERRL. EOMI. Head: Atraumatic. Nose: No congestion/rhinnorhea. Mouth/Throat: Mucous membranes are moist.  Oropharynx non-erythematous. Neck: No stridor.   Cardiovascular: Normal rate, regular rhythm. Grossly normal heart sounds.  Good peripheral circulation. Respiratory: Normal respiratory effort.  No retractions. Lungs  CTAB. Gastrointestinal: Soft and nontender. No distention. No abdominal bruits. No CVA tenderness. Musculoskeletal: No lower extremity tenderness nor edema.  No joint effusions. Neurologic:  Normal speech and language. No gross focal neurologic deficits are appreciated. No gait instability. Skin:  Skin is warm, dry and intact. No rash noted. Psychiatric: Mood and affect are normal. Speech and behavior are normal.  ____________________________________________   LABS (all labs ordered are listed, but only abnormal results are displayed)  Labs Reviewed  BASIC METABOLIC PANEL - Abnormal; Notable for the following components:      Result Value   Glucose, Bld 139 (*)    All other components within normal limits  CBC WITH DIFFERENTIAL/PLATELET - Abnormal; Notable for the following components:   Hemoglobin 9.5 (*)    HCT 31.9 (*)    MCV 71.0 (*)    MCH 21.2 (*)    MCHC 29.8 (*)    RDW 17.4 (*)    All other components within normal limits  URINALYSIS, COMPLETE (UACMP) WITH MICROSCOPIC - Abnormal; Notable for the following components:   Color, Urine YELLOW (*)    APPearance HAZY (*)    Hgb urine dipstick SMALL (*)    Nitrite POSITIVE (*)    Leukocytes,Ua MODERATE (*)    Bacteria, UA RARE (*)    All other components within normal limits  D-DIMER, QUANTITATIVE  BRAIN NATRIURETIC PEPTIDE  ____________________________________________  RADIOLOGY Lura Em, personally viewed and evaluated these images (plain radiographs) as part of my medical decision making, as well as reviewing the written report by the radiologist.  ED MD interpretation:  Agree with report  Official radiology report(s): CT Angio Chest PE W and/or Wo Contrast  Result Date: 07/29/2020 CLINICAL DATA:  Increased shortness of breath EXAM: CT ANGIOGRAPHY CHEST WITH CONTRAST TECHNIQUE: Multidetector CT imaging of the chest was performed using the standard protocol during bolus administration of intravenous  contrast. Multiplanar CT image reconstructions and MIPs were obtained to evaluate the vascular anatomy. CONTRAST:  OMNIPAQUE IOHEXOL 350 MG/ML SOLN COMPARISON:  CT chest 05/22/2020, chest x-ray 05/21/2020 FINDINGS: Cardiovascular: Respiratory motion degradation. Questionable filling defects within left lower lobe subsegmental vessels but suspect that this is largely related to motion. No definitive acute main, lobar, or proximal segmental embolus is seen. Borderline to mild cardiomegaly. No pericardial effusion. Nonaneurysmal aorta Mediastinum/Nodes: Midline trachea. No thyroid mass. No suspicious adenopathy. Esophagus within normal limits. Lungs/Pleura: Elevated right diaphragm. Worsening consolidation in the right middle lobe and right lower lobe. Post infectious scarring within the upper lobes with bronchiectasis. Increased partial consolidation in the posterior right upper lobe with streaky and hazy upper lobe pulmonary densities. Upper Abdomen: No acute abnormality. Musculoskeletal: No chest wall abnormality. No acute or significant osseous findings. Review of the MIP images confirms the above findings. IMPRESSION: 1. Respiratory motion degradation. Poor enhancement of left lower lobe subsegmental vessels suspect largely related to motion. No definitive acute central embolus is seen. 2. Worsening consolidation in the right middle and lower lobes with increased partial consolidation in the posterior right upper lobe with streaky and hazy upper lobe pulmonary densities. Findings could be secondary to worsening atelectasis or pneumonia superimposed on underlying chronic lung disease. 3. Post infectious scarring and bronchiectasis within the upper lobes. 4. Aortic atherosclerosis. Aortic Atherosclerosis (ICD10-I70.0). Electronically Signed   By: Jasmine Pang M.D.   On: 07/29/2020 20:13  ____________________________________________   PROCEDURES  Procedure(s) performed (including Critical  Care):  Procedures  Amoxicillin-clavulanate 875-125 mg PO Oxycodone-APAP  5-325 mg PO ____________________________________________   INITIAL IMPRESSION / ASSESSMENT AND PLAN / ED COURSE  As part of my medical decision making, I reviewed the following data within the electronic MEDICAL RECORD NUMBER Labs reviewed WNL, Old chart reviewed, Notes from prior ED visits and [place] Controlled Substance Database   Differential includes, but is not limited to, viral syndrome, bronchitis including COPD exacerbation, pneumonia, reactive airway disease including asthma, CHF including exacerbation with or without pulmonary/interstitial edema, pneumothorax, ACS, thoracic trauma, and pulmonary embolism.  Patient ED evaluation of increased shortness of breath over baseline.  Patient is stable at 4 L per nasal cannula and satting at 100%.  Labs overall benign reassuring at this time.  CT angio does not reveal any acute emboli or other infectious process.  Patient stable at the time of this disposition.  She will be discharged with a prescription for amoxicillin to take for suspected community-acquired pneumonia.  She will follow-up with primary provider or return to ED if needed. ____________________________________________   FINAL CLINICAL IMPRESSION(S) / ED DIAGNOSES  Final diagnoses:  SOB (shortness of breath)  Community acquired pneumonia of right lung, unspecified part of lung     ED Discharge Orders    None      *Please note:  [name] [name] was evaluated in Emergency Department on 07/29/2020 for the symptoms described in the history of present illness. She was evaluated in the context of the global COVID-19 pandemic, which necessitated consideration that the patient might be at risk for infection with the SARS-CoV-2 virus that causes COVID-19. Institutional protocols and algorithms that pertain to the evaluation of patients at risk for COVID-19 are in a state of rapid change based on information released  by regulatory bodies including the CDC and federal and state organizations. These policies and algorithms were followed during the patient's care in the ED.  Some ED evaluations and interventions may be delayed as a result of limited staffing during and the pandemic.*   Note:  This document was prepared using Dragon voice recognition software and may include unintentional dictation errors.    Lissa Hoard, PA-C 07/31/20 1526    Merwyn Katos, MD 08/01/20 [phone]

## 2020-07-30 ENCOUNTER — Telehealth: Payer: Self-pay | Admitting: Pulmonary Disease

## 2020-07-30 ENCOUNTER — Other Ambulatory Visit: Payer: Self-pay | Admitting: Adult Health

## 2020-07-30 ENCOUNTER — Encounter: Admit: 2020-07-30 | Discharge: 2020-08-28 | Payer: MEDICAID

## 2020-07-30 MED ORDER — PRAMIPEXOLE 0.125 MG TABLET
ORAL_TABLET | 0 refills | 0.00000 days
Start: 2020-07-30 — End: ?

## 2020-07-30 MED ORDER — ZINC SULFATE 50 MG ZINC (220 MG) CAPSULE
ORAL_CAPSULE | 0 refills | 0.00000 days
Start: 2020-07-30 — End: ?

## 2020-07-30 NOTE — Telephone Encounter (Signed)
Please see 07/29/2020 phone note.   Spoke to patient, who stated that she went to ED as instructed and was told that her CXR showed possible PNA. She was prescribed Augmentin.   patient would like for Dr. Jayme Cloud to review CXR and ED notes.  Dr. Jayme Cloud, please advise. thanks

## 2020-07-30 NOTE — Telephone Encounter (Signed)
Please advise if you are okay with meds being refilled.

## 2020-07-30 NOTE — Telephone Encounter (Signed)
Agree looks like she had pneumonia compared to the prior CT.

## 2020-07-30 NOTE — Telephone Encounter (Signed)
Patient is aware of below message and voiced her understanding.  Nothing further needed at this time.   

## 2020-08-04 ENCOUNTER — Telehealth: Payer: Self-pay | Admitting: Pulmonary Disease

## 2020-08-04 DIAGNOSIS — J9611 Chronic respiratory failure with hypoxia: Secondary | ICD-10-CM

## 2020-08-04 NOTE — Telephone Encounter (Signed)
Spoke to patient's mother, Betty(DPR). Kathie Rhodes stated that [employer] came by patient home and attempted to pickup patient oxygen. I have spoken to [facility] [employer] [facility] with [facility], who stated that she would research this and call back with update.  Will await call back.

## 2020-08-05 NOTE — Telephone Encounter (Signed)
lmtcb for [name] [name] at [facility]  

## 2020-08-06 ENCOUNTER — Telehealth: Payer: Self-pay | Admitting: Pulmonary Disease

## 2020-08-06 ENCOUNTER — Ambulatory Visit
Admit: 2020-08-06 | Discharge: 2020-08-06 | Payer: MEDICAID | Attending: Foot & Ankle Surgery | Primary: Foot & Ankle Surgery

## 2020-08-06 DIAGNOSIS — M79674 Pain in right toe(s): Principal | ICD-10-CM

## 2020-08-06 DIAGNOSIS — M79675 Pain in left toe(s): Principal | ICD-10-CM

## 2020-08-06 DIAGNOSIS — L97422 Non-pressure chronic ulcer of left heel and midfoot with fat layer exposed: Principal | ICD-10-CM

## 2020-08-06 DIAGNOSIS — Z9889 Other specified postprocedural states: Principal | ICD-10-CM

## 2020-08-06 DIAGNOSIS — Z89419 Acquired absence of unspecified great toe: Principal | ICD-10-CM

## 2020-08-06 DIAGNOSIS — E11621 Type 2 diabetes mellitus with foot ulcer: Principal | ICD-10-CM

## 2020-08-06 NOTE — Telephone Encounter (Signed)
Patient has not been contacted to schedule sleep study that was ordered on 07/10/2020.  Synetta Fail, can you help with this?

## 2020-08-06 NOTE — Telephone Encounter (Signed)
Spoke to [place] with [employer], who stated that she has not received response from oxygen department. She will reach out again and call our office back with update.

## 2020-08-06 NOTE — Telephone Encounter (Signed)
Form and records faxed to [facility] on 4/12. I have left a message for Ladona Ridgel with [facility] to see where they are about getting this sleep study scheduled

## 2020-08-07 ENCOUNTER — Ambulatory Visit: Payer: Medicaid Other | Attending: Pulmonary Disease

## 2020-08-07 DIAGNOSIS — R0902 Hypoxemia: Secondary | ICD-10-CM | POA: Diagnosis not present

## 2020-08-07 DIAGNOSIS — G4733 Obstructive sleep apnea (adult) (pediatric): Secondary | ICD-10-CM | POA: Diagnosis present

## 2020-08-07 DIAGNOSIS — G4736 Sleep related hypoventilation in conditions classified elsewhere: Secondary | ICD-10-CM | POA: Insufficient documentation

## 2020-08-07 NOTE — Telephone Encounter (Signed)
I called back and spoke with Ladona Ridgel. She did get to speak with the Mom yesterday and she is aware the sleep study is scheduled tonight 08/07/20 and where

## 2020-08-08 ENCOUNTER — Other Ambulatory Visit: Payer: Self-pay

## 2020-08-08 NOTE — Telephone Encounter (Signed)
Spoke to [place] with [employer], who stated that patient is due for re qualification. A new order is needed.  Order has been placed to [facility], as patient was qualified on 07/10/2020. Nothing further needed at this time.

## 2020-08-12 ENCOUNTER — Ambulatory Visit: Admit: 2020-08-12 | Discharge: 2020-08-13 | Payer: MEDICAID

## 2020-08-13 ENCOUNTER — Telehealth (INDEPENDENT_AMBULATORY_CARE_PROVIDER_SITE_OTHER): Payer: Medicaid Other | Admitting: Pulmonary Disease

## 2020-08-13 DIAGNOSIS — G4734 Idiopathic sleep related nonobstructive alveolar hypoventilation: Secondary | ICD-10-CM

## 2020-08-13 NOTE — Telephone Encounter (Signed)
Nocturnal hypoxia due to obesity needs O2 @ 3 LPM nocturnaly. No OSA per Dr. Vassie Loll.

## 2020-08-13 NOTE — Telephone Encounter (Signed)
NPSG : no sig OSA  3 L O2 required for hypoxia

## 2020-08-14 ENCOUNTER — Other Ambulatory Visit: Payer: Self-pay | Admitting: Adult Health

## 2020-08-14 MED ORDER — PRAMIPEXOLE 0.125 MG TABLET
ORAL_TABLET | Freq: Every evening | ORAL | 1 refills | 90 days
Start: 2020-08-14 — End: 2021-08-14

## 2020-08-14 MED ORDER — BUSPIRONE 10 MG TABLET
Freq: Two times a day (BID) | ORAL | 0 refills | 0 days
Start: 2020-08-14 — End: ?

## 2020-08-14 NOTE — Telephone Encounter (Signed)
Meds need to be filled by primary MD these were not precribed by [name]

## 2020-08-14 NOTE — Telephone Encounter (Signed)
Pt requesting a refill on Eliquis. I do not see where this med was mentioned in last 2 OV notes.  Pt has had this filled X1 by [facility], but other refills have come from outside providers.  Dr. Jayme Cloud please advise if you're ok with refilling Eliquis. Thanks!

## 2020-08-14 NOTE — Telephone Encounter (Signed)
Per Dr. Delight Stare for patient to continue at 4L QHS.   Patient is aware and voiced her understanding.  Nothing further needed at this time.

## 2020-08-14 NOTE — Telephone Encounter (Signed)
Rx for Eliquis has been sent to preferred pharmacy.

## 2020-08-14 NOTE — Telephone Encounter (Signed)
It is noted on my note of 3/24. Yes go ahead and refill.

## 2020-08-14 NOTE — Telephone Encounter (Signed)
These medications should be filled by her primary MD, we did not originate Rx

## 2020-08-14 NOTE — Telephone Encounter (Addendum)
Request has been denied and deferred to PCP/

## 2020-08-14 NOTE — Telephone Encounter (Signed)
Spoke to patient and relayed below results.  Patient stated that she currently wears 4L QHS. She feels that she can not breathe on 3L.  Dr. Jayme Cloud, please advise if okay to continue at 4L. Thanks

## 2020-08-15 ENCOUNTER — Ambulatory Visit: Admit: 2020-08-15 | Discharge: 2020-08-28 | Disposition: A | Discharge status: Home Health | Payer: MEDICAID

## 2020-08-15 ENCOUNTER — Encounter: Admit: 2020-08-15 | Discharge: 2020-08-28 | Disposition: A | Discharge status: Home Health | Payer: MEDICAID

## 2020-08-15 MED ORDER — PRAMIPEXOLE 0.125 MG TABLET
ORAL_TABLET | Freq: Every evening | ORAL | 1 refills | 90.00000 days | Status: CP
Start: 2020-08-15 — End: 2021-08-15

## 2020-08-19 ENCOUNTER — Other Ambulatory Visit: Payer: Self-pay

## 2020-08-19 ENCOUNTER — Telehealth (INDEPENDENT_AMBULATORY_CARE_PROVIDER_SITE_OTHER): Payer: No Typology Code available for payment source | Admitting: Psychiatry

## 2020-08-19 DIAGNOSIS — F411 Generalized anxiety disorder: Secondary | ICD-10-CM | POA: Diagnosis not present

## 2020-08-19 DIAGNOSIS — F331 Major depressive disorder, recurrent, moderate: Secondary | ICD-10-CM

## 2020-08-19 NOTE — Progress Notes (Signed)
Virtual Visit via Telephone Note  I connected with [name] [name] on 08/19/20 at  1:40 PM EDT by telephone and verified that I am speaking with the correct person using two identifiers.  Location: Patient: [name] Provider: Hospital   I discussed the limitations, risks, security and privacy concerns of performing an evaluation and management service by telephone and the availability of in person appointments. I also discussed with the patient that there may be a patient responsible charge related to this service. The patient expressed understanding and agreed to proceed.   History of Present Illness: This is a follow-up for this 60 year old woman with depression and anxiety.  Patient was reached by telephone.  To my surprise she tells me she is currently in the intensive care unit at [facility].  Apparently her medical problems which are documented in the chart have continued to get worse with more breathing and lung problems.  I offered the patient to terminate the appointment because of the circumstances but she wanted to talk a little.  Tells me that her nerves are okay.  Not feeling depressed.  Does get worried about her situation and frightened at times.  Denies suicidal thoughts.  Seems awake and alert and lucid with some understanding of her current situation.  Tells me the staff there are taking care of her anxiety problems.    Observations/Objective: Patient sounded tired out and sick but lucid and not delirious.  Denied suicidal thought denied psychotic symptoms   Assessment and Plan: Patient unfortunately has been suffering a lot of medical issues.  I am sure they are doing their best with managing her medication to not be over sedating in the intensive care unit.  Prescriptions were all refilled for 6 months back in March and do not need new refills at this time.  I told her I would be thinking of her and we should talk again in 3 to 6 months.   Follow Up Instructions:     I discussed the assessment and treatment plan with the patient. The patient was provided an opportunity to ask questions and all were answered. The patient agreed with the plan and demonstrated an understanding of the instructions.   The patient was advised to call back or seek an in-person evaluation if the symptoms worsen or if the condition fails to improve as anticipated.  I provided 20 minutes of non-face-to-face time during this encounter.   Mordecai Rasmussen, MD

## 2020-08-22 ENCOUNTER — Ambulatory Visit: Admit: 2020-08-22 | Payer: MEDICAID

## 2020-08-22 ENCOUNTER — Ambulatory Visit: Admit: 2020-08-22 | Payer: MEDICAID | Attending: Foot & Ankle Surgery | Primary: Foot & Ankle Surgery

## 2020-08-26 DIAGNOSIS — R5381 Other malaise: Principal | ICD-10-CM

## 2020-08-28 MED ORDER — HYDROXYZINE HCL 50 MG TABLET
ORAL_TABLET | Freq: Four times a day (QID) | ORAL | 0 refills | 15 days | Status: CP | PRN
Start: 2020-08-28 — End: ?

## 2020-08-28 MED ORDER — METOPROLOL TARTRATE 25 MG TABLET
ORAL_TABLET | Freq: Two times a day (BID) | ORAL | 0 refills | 30 days | Status: CP
Start: 2020-08-28 — End: 2020-09-27

## 2020-08-29 ENCOUNTER — Encounter: Admit: 2020-08-29 | Discharge: 2020-09-25 | Payer: MEDICAID

## 2020-09-05 ENCOUNTER — Ambulatory Visit: Admit: 2020-09-05 | Discharge: 2020-09-06 | Payer: MEDICAID

## 2020-09-05 DIAGNOSIS — E139 Other specified diabetes mellitus without complications: Principal | ICD-10-CM

## 2020-09-05 DIAGNOSIS — T148XXA Other injury of unspecified body region, initial encounter: Principal | ICD-10-CM

## 2020-09-05 DIAGNOSIS — Z6841 Body Mass Index (BMI) 40.0 and over, adult: Principal | ICD-10-CM

## 2020-09-06 ENCOUNTER — Other Ambulatory Visit: Payer: Self-pay | Admitting: Adult Health

## 2020-09-06 MED ORDER — CHOLECALCIFEROL (VITAMIN D3) 125 MCG (5,000 UNIT) CAPSULE
ORAL_CAPSULE | Freq: Every day | ORAL | 1 refills | 90 days | Status: CP
Start: 2020-09-06 — End: 2021-09-06

## 2020-09-06 MED ORDER — PRAMIPEXOLE 0.125 MG TABLET
ORAL_TABLET | Freq: Every evening | ORAL | 1 refills | 90.00000 days | Status: CP
Start: 2020-09-06 — End: 2021-09-06

## 2020-09-08 ENCOUNTER — Other Ambulatory Visit: Payer: Self-pay | Admitting: Adult Health

## 2020-09-08 ENCOUNTER — Ambulatory Visit: Admit: 2020-09-08 | Discharge: 2020-09-08 | Payer: MEDICAID

## 2020-09-08 ENCOUNTER — Ambulatory Visit: Admit: 2020-09-08 | Discharge: 2020-09-08 | Payer: MEDICAID | Attending: Rheumatology | Primary: Rheumatology

## 2020-09-08 DIAGNOSIS — M0579 Rheumatoid arthritis with rheumatoid factor of multiple sites without organ or systems involvement: Principal | ICD-10-CM

## 2020-09-08 DIAGNOSIS — M25511 Pain in right shoulder: Principal | ICD-10-CM

## 2020-09-08 MED ORDER — METOPROLOL TARTRATE 25 MG TABLET
ORAL_TABLET | Freq: Two times a day (BID) | ORAL | 0 refills | 90 days | Status: CP
Start: 2020-09-08 — End: 2021-09-08

## 2020-09-08 MED ORDER — ASCORBIC ACID (VITAMIN C) 500 MG TABLET
ORAL_TABLET | Freq: Every day | ORAL | 0 refills | 90 days | Status: CP
Start: 2020-09-08 — End: 2021-09-08

## 2020-09-08 MED ORDER — ZINC SULFATE 50 MG ZINC (220 MG) CAPSULE
ORAL_CAPSULE | Freq: Every day | ORAL | 1 refills | 90 days | Status: CP
Start: 2020-09-08 — End: 2021-09-08

## 2020-09-08 MED ORDER — HYDROXYZINE HCL 25 MG TABLET
ORAL_TABLET | Freq: Four times a day (QID) | ORAL | 0 refills | 45 days | Status: CP | PRN
Start: 2020-09-08 — End: 2021-09-08

## 2020-09-09 ENCOUNTER — Ambulatory Visit
Payer: Medicaid Other | Attending: Student in an Organized Health Care Education/Training Program | Admitting: Student in an Organized Health Care Education/Training Program

## 2020-09-09 ENCOUNTER — Encounter: Payer: Self-pay | Admitting: Student in an Organized Health Care Education/Training Program

## 2020-09-09 ENCOUNTER — Other Ambulatory Visit: Payer: Self-pay

## 2020-09-09 ENCOUNTER — Telehealth: Payer: Self-pay | Admitting: Pulmonary Disease

## 2020-09-09 VITALS — BP 115/57 | HR 66 | Temp 96.9°F | Resp 16 | Ht 66.0 in | Wt 320.0 lb

## 2020-09-09 DIAGNOSIS — E1142 Type 2 diabetes mellitus with diabetic polyneuropathy: Secondary | ICD-10-CM

## 2020-09-09 DIAGNOSIS — M792 Neuralgia and neuritis, unspecified: Secondary | ICD-10-CM | POA: Diagnosis not present

## 2020-09-09 DIAGNOSIS — G894 Chronic pain syndrome: Secondary | ICD-10-CM

## 2020-09-09 MED ORDER — OXYCODONE-ACETAMINOPHEN 5-325 MG PO TABS: 1.0000 | ORAL_TABLET | Freq: Four times a day (QID) | ORAL | 0 refills | Status: AC | PRN

## 2020-09-09 MED ORDER — OXYCODONE-ACETAMINOPHEN 5-325 MG PO TABS: 1.0000 | ORAL_TABLET | Freq: Four times a day (QID) | ORAL | 0 refills | Status: DC | PRN

## 2020-09-09 MED ORDER — AMLODIPINE 10 MG TABLET
0 days
Start: 2020-09-09 — End: ?

## 2020-09-09 MED ORDER — ZOLPIDEM 5 MG TABLET
0.00000 days
Start: 2020-09-09 — End: ?

## 2020-09-09 MED ORDER — CALCIUM CITRATE 315 MG CALCIUM-VITAMIN D3 6.25 MCG (250 UNIT) TABLET
0.00000 days
Start: 2020-09-09 — End: ?

## 2020-09-09 NOTE — Progress Notes (Signed)
Nursing Pain Medication Assessment:  Safety precautions to be maintained throughout the outpatient stay will include: orient to surroundings, keep bed in low position, maintain call bell within reach at all times, provide assistance with transfer out of bed and ambulation.  Medication Inspection Compliance: Pill count conducted under aseptic conditions, in front of the patient. Neither the pills nor the bottle was removed from the patient's sight at any time. Once count was completed pills were immediately returned to the patient in their original bottle.  Medication: Oxycodone/APAP Pill/Patch Count: 0 of 120 pills remain Pill/Patch Appearance: Markings consistent with prescribed medication Bottle Appearance: Standard pharmacy container. Clearly labeled. Filled Date: 07/19/2020 Last Medication intake:  Today

## 2020-09-09 NOTE — Telephone Encounter (Signed)
Spoke to patient's mother, Betty(DPR). Kathie Rhodes stated that patient's daughter plans to purchase a POC for patient from [facility]. Patient currently wears 3L with exertion and 4 QHS.   Dr. Jayme Cloud, please advise if okay to order POC.   Kathie Rhodes is aware that Dr. Jayme Cloud is unavailable. She is okay with waiting on results.

## 2020-09-09 NOTE — Progress Notes (Signed)
PROVIDER NOTE: Information contained herein reflects review and annotations entered in association with encounter. Interpretation of such information and data should be left to medically-trained personnel. Information provided to patient can be located elsewhere in the medical record under "Patient Instructions". Document created using STT-dictation technology, any transcriptional errors that may result from process are unintentional.    Patient: [name] [name]  Service Category: E/M  Provider: Gillis Santa, MD  DOB: 03-12-1961  DOS: 09/09/2020  Specialty: Interventional Pain Management  MRN: [id number]  Setting: Ambulatory outpatient  PCP: [facility]  Type: Established Patient    Referring Provider: [facility]  Location: Office  Delivery: Face-to-face     HPI  [name], a 60 y.o. year old female, is here today because of her Chronic pain syndrome [G89.4]. Ms. Gehrig primary complain today is Joint Pain Last encounter: My last encounter with her was on 06/17/2020. Pertinent problems: Ms. Puccini has Chronic wrist pain (Secondary area of Pain) (Bilateral) (L>R); Chronic knee pain (Bilateral) (R>L); Long term current use of opiate analgesic; Opiate use (30 MME/Day); Neurogenic pain; Neuropathic pain; Diabetic peripheral neuropathy ([place]); Osteoarthritis, multiple sites; Chronic foot pain (Primary Area of Pain) (Bilateral) (L>R); Chronic elbow pain (Third area of Pain) (Bilateral) (L>R); Chronic upper back pain; Chronic pain syndrome; Rheumatoid arthritis involving multiple sites with positive rheumatoid factor ([place]); CKD (chronic kidney disease), stage III ([place]); Morbid obesity with BMI of 50.0-59.9, adult ([place]); and Severe sepsis with septic shock ([place]) on their pertinent problem list. Pain Assessment: Severity of Chronic pain is reported as a 6 /10. Location: Other (Comment) (joint)  / . Onset: More than a month ago. Quality: Sharp. Timing: Constant. Modifying factor(s):  medications. Vitals:  height is 5' 6"  (1.676 m) and weight is 320 lb (145.2 kg) (abnormal). Her temporal temperature is 96.9 F (36.1 C) (abnormal). Her blood pressure is 115/57 (abnormal) and her pulse is 66. Her respiration is 16 and oxygen saturation is 91%.   Reason for encounter: medication management.    Patient presents today for medication management.  She was admitted to the hospital for shortness of breath, community-acquired pneumonia and sepsis.  She spent 4 days in the hospital. She presents today for medication refill of her Percocet.  She states that this medication does help her function and helps to manage her pain.  I informed Dody that we need to think about weaning her medications in the future however she states that she is not in a position to do that at this time as when she has increased pain, it does increase her shortness of breath which makes it tougher for her to breathe.  This is reasonable.  We will continue her dose as it is.  I recommend that she reduce her Lyrica to 150 twice daily given slightly increased creatinine.  Pharmacotherapy Assessment   Analgesic: Percocet 5 mg every 6 hours as needed, quantity 120/month; MME equals 30 Monitoring: [place] PMP: PDMP reviewed during this encounter.       Pharmacotherapy: No side-effects or adverse reactions reported. Compliance: No problems identified. Effectiveness: Clinically acceptable.  Landis Martins, RN  09/09/2020 11:40 AM  Sign when Signing Visit Nursing Pain Medication Assessment:  Safety precautions to be maintained throughout the outpatient stay will include: orient to surroundings, keep bed in low position, maintain call bell within reach at all times, provide assistance with transfer out of bed and ambulation.  Medication Inspection Compliance: Pill count conducted under aseptic conditions, in front of the  patient. Neither the pills nor the bottle was removed from the patient's sight at any time. Once count was  completed pills were immediately returned to the patient in their original bottle.  Medication: Oxycodone/APAP Pill/Patch Count: 0 of 120 pills remain Pill/Patch Appearance: Markings consistent with prescribed medication Bottle Appearance: Standard pharmacy container. Clearly labeled. Filled Date: 07/19/2020 Last Medication intake:  Today    UDS:  Summary  Date Value Ref Range Status  12/11/2019 Note  Final    Comment:    ==================================================================== ToxASSURE Select 13 (MW) ==================================================================== Test                             Result       Flag       Units  Drug Present not Declared for Prescription Verification   Oxycodone                      1378         UNEXPECTED ng/mg creat   Oxymorphone                    410          UNEXPECTED ng/mg creat   Noroxycodone                   2537         UNEXPECTED ng/mg creat    Sources of oxycodone include scheduled prescription medications.    Oxymorphone and noroxycodone are expected metabolites of oxycodone.    Oxymorphone is also available as a scheduled prescription medication.  Drug Absent but Declared for Prescription Verification   Alprazolam                     Not Detected UNEXPECTED ng/mg creat ==================================================================== Test                      Result    Flag   Units      Ref Range   Creatinine              63               mg/dL      >=20 ==================================================================== Declared Medications:  The flagging and interpretation on this report are based on the  following declared medications.  Unexpected results may arise from  inaccuracies in the declared medications.   **Note: The testing scope of this panel includes these medications:   Alprazolam   **Note: The testing scope of this panel does not include the  following reported medications:   Albuterol  (Duoneb)  Aripiprazole (Abilify)  Atorvastatin  Budesonide (Pulmicort)  Buspirone (Buspar)  Calcium  Cholecalciferol  Famotidine  Fluoxetine (Prozac)  Folic Acid  Furosemide  Insulin Tyler Aas)  Ipratropium (Duoneb)  Levothyroxine  Polyethylene Glycol (MiraLAX)  Potassium (Klor-Con)  Pramipexole (Mirapex)  Pregabalin (Lyrica)  Quetiapine (Seroquel)  Topical  Vitamin C  Vitamin D  Zolpidem (Ambien) ==================================================================== For clinical consultation, please call [phone]. ====================================================================      ROS  Constitutional: Intermittent shortness of breath Gastrointestinal: No reported hemesis, hematochezia, vomiting, or acute GI distress Musculoskeletal: Low back pain, bilateral leg pain Neurological: No reported episodes of acute onset apraxia, aphasia, dysarthria, agnosia, amnesia, paralysis, loss of coordination, or loss of consciousness  Medication Review  ALPRAZolam, ARIPiprazole, FLUoxetine, QUEtiapine, Vitamin D, acetaminophen, apixaban, ascorbic acid, atorvastatin, budesonide, calcium citrate-vitamin D,  famotidine, folic acid, furosemide, insulin lispro, ipratropium-albuterol, levothyroxine, linagliptin, metFORMIN, mupirocin ointment, oxyCODONE-acetaminophen, polyethylene glycol, pramipexole, pregabalin, solifenacin, vancomycin, zinc sulfate, and zolpidem  History Review  Allergy: Ms. Schleicher is allergic to cephalexin, codeine, doxycycline, propoxyphene, sulfa antibiotics, clindamycin, lovenox [enoxaparin sodium], hydrocodone, and meropenem. Drug: Ms. Klepacki  reports no history of drug use. Alcohol:  reports no history of alcohol use. Tobacco:  reports that she quit smoking about 21 years ago. Her smoking use included cigarettes. She has a 54.00 pack-year smoking history. She has never used smokeless tobacco. Social: Ms. Petitti  reports that she quit smoking about 21 years ago. Her  smoking use included cigarettes. She has a 54.00 pack-year smoking history. She has never used smokeless tobacco. She reports that she does not drink alcohol and does not use drugs. Medical:  has a past medical history of Abdominal wall hernia (01/29/2013), Anxiety, Arthritis, C. difficile colitis, Chronic diastolic heart failure ([place]), COVID-19 (03/23/2019), Depression, Diabetes mellitus, Diastolic CHF ([place]), Esophagitis, Fluid retention, GERD (gastroesophageal reflux disease), Hiatal hernia, Hypertension, Hypokalemia due to loss of potassium (10/21/2015), Hypothyroidism, IBS (irritable bowel syndrome), Moderate episode of recurrent major depressive disorder ([place]) (06/03/2004), Morbid obesity ([place]), MRSA (methicillin resistant Staphylococcus aureus) infection (11/2017), Neurogenic bladder, Neuropathy, Obesity, Panic attacks, Pneumonia due to COVID-19 virus, Rheumatoid arthritis ([place]), and Sleep apnea. Surgical: Ms. Paone  has a past surgical history that includes Tubal ligation; Tonsillectomy; Cholecystectomy; Abdominal hysterectomy; Laparoscopic gastric banding (03/20/07); Eye surgery; Hernia repair; DG GREAT TOE RIGHT FOOT (02/23/2018); TEE without cardioversion (N/A, 07/16/2019); and Irrigation and debridement foot (Left, 01/11/2020). Family: family history includes Alcohol abuse in her father and sister; Anxiety disorder in her father, sister, and sister; Bipolar disorder in her father and sister; Depression in her father, sister, and sister; Drug abuse in her sister; Heart attack in her brother; Heart attack (age of onset: 35) in her brother; Heart disease in her brother; Heart failure in her father.  Laboratory Chemistry Profile   Renal Lab Results  Component Value Date   BUN 15 07/29/2020   CREATININE 0.83 07/29/2020   BCR 16 11/16/2018   GFRAA >60 01/15/2020   GFRNONAA >60 07/29/2020     Hepatic Lab Results  Component Value Date   AST 32 04/28/2020   ALT 31 04/28/2020   ALBUMIN 3.3 (L)  05/21/2020   ALKPHOS 70 04/28/2020   LIPASE 176 (H) 07/13/2019     Electrolytes Lab Results  Component Value Date   NA 138 07/29/2020   K 3.7 07/29/2020   CL 100 07/29/2020   CALCIUM 9.2 07/29/2020   MG 1.7 01/15/2020   PHOS 2.8 05/21/2020     Bone Lab Results  Component Value Date   VD25OH 37.36 04/30/2019   25OHVITD1 23 (L) 11/16/2018   25OHVITD2 2.2 11/16/2018   25OHVITD3 21 11/16/2018     Inflammation (CRP: Acute Phase) (ESR: Chronic Phase) Lab Results  Component Value Date   CRP 2.1 (H) 07/17/2019   ESRSEDRATE 65 (H) 07/17/2019   LATICACIDVEN 1.4 04/28/2020       Note: Above Lab results reviewed.  Physical Exam  General appearance: Well nourished, well developed, and well hydrated. In no apparent acute distress Mental status: Alert, oriented x 3 (person, place, & time)       Respiratory: Oxygen-dependent COPD Eyes: PERLA Vitals: BP (!) 115/57   Pulse 66   Temp (!) 96.9 F (36.1 C) (Temporal)   Resp 16   Ht 5' 6"  (1.676 m)   Wt (!) 320 lb (145.2  kg)   LMP 04/20/2001   SpO2 91% Comment: O2 at 4 linters  BMI 51.65 kg/m  BMI: Estimated body mass index is 51.65 kg/m as calculated from the following:   Height as of this encounter: 5' 6"  (1.676 m).   Weight as of this encounter: 320 lb (145.2 kg). Ideal: Ideal body weight: 59.3 kg (130 lb 11.7 oz) Adjusted ideal body weight: 93.6 kg (206 lb 7 oz)   Patient comes today in a wheelchair.  Morbidly obese.  Lower extremity edema, pitting Low back pain, bilateral hip pain, bilateral knee pain Neuropathic pain of bilateral lower extremity Musculoskeletal pain overlying various joints most pronounced in her elbow, wrist, hips, low back  Assessment   Diagnosis  1. Chronic pain syndrome   2. Diabetic peripheral neuropathy ([place])   3. Neurogenic pain   4. Neuropathic pain       Plan of Care  Ms. SALEENA TAMAS has a current medication list which includes the following long-term medication(s): aripiprazole,  budesonide, calcium citrate-vitamin d, eliquis, famotidine, fluoxetine, furosemide, insulin lispro, ipratropium-albuterol, pregabalin, quetiapine, and zolpidem.  Pharmacotherapy (Medications Ordered): Meds ordered this encounter  Medications  . oxyCODONE-acetaminophen (PERCOCET) 5-325 MG tablet    Sig: Take 1 tablet by mouth every 6 (six) hours as needed for severe pain. Must last 30 days.    Dispense:  120 tablet    Refill:  0    Chronic Pain. (STOP Act - Not applicable). Fill one day early if closed on scheduled refill date.  Marland Kitchen oxyCODONE-acetaminophen (PERCOCET) 5-325 MG tablet    Sig: Take 1 tablet by mouth every 6 (six) hours as needed for severe pain. Must last 30 days.    Dispense:  120 tablet    Refill:  0    Chronic Pain. (STOP Act - Not applicable). Fill one day early if closed on scheduled refill date.   Recommend patient reduce her Lyrica to 150 mg twice a day from 3 times a day given increase in creatinine.  Orders:  Orders Placed This Encounter  Procedures  . ToxASSURE Select 13 (MW), Urine    Volume: 30 ml(s). Minimum 3 ml of urine is needed. Document temperature of fresh sample. Indications: Long term (current) use of opiate analgesic [phone])    Order Specific Question:   Release to patient    Answer:   Immediate   Follow-up plan:   Return in about 3 months (around 12/10/2020) for Medication Management, in person.   Recent Visits Date Type Provider Dept  06/17/20 Office Visit Gillis Santa, MD [facility]  Showing recent visits within past 90 days and meeting all other requirements Today's Visits Date Type Provider Dept  09/09/20 Office Visit Gillis Santa, MD [facility]  Showing today's visits and meeting all other requirements Future Appointments Date Type Provider Dept  12/04/20 Appointment Gillis Santa, MD [facility]  Showing future appointments within next 90 days and meeting all other requirements  I discussed the  assessment and treatment plan with the patient. The patient was provided an opportunity to ask questions and all were answered. The patient agreed with the plan and demonstrated an understanding of the instructions.  Patient advised to call back or seek an in-person evaluation if the symptoms or condition worsens.  Duration of encounter: 30 minutes.  Note by: Gillis Santa, MD Date: 09/09/2020; Time: 12:02 PM

## 2020-09-10 MED ORDER — METHOTREXATE SODIUM 2.5 MG TABLET
ORAL_TABLET | ORAL | 0 refills | 84 days | Status: CP
Start: 2020-09-10 — End: 2020-10-10

## 2020-09-10 MED ORDER — FOLIC ACID 1 MG TABLET
ORAL_TABLET | Freq: Every day | ORAL | 3 refills | 90.00000 days | Status: CP
Start: 2020-09-10 — End: ?

## 2020-09-10 NOTE — Telephone Encounter (Signed)
Spoke to patient's mother, Betty(DPR), who stated that [facility] is unable to do a POC eval.    Dr. Jayme Cloud, please advise if kay to schedule patient for POC eval in office?

## 2020-09-10 NOTE — Telephone Encounter (Signed)
Spoke to patient's mother, Betty(DPR) and relayed below message.  Kathie Rhodes states that she would contact [facility] to see if they can do a POC eval. Kathie Rhodes will call back with update.

## 2020-09-10 NOTE — Telephone Encounter (Signed)
Lm for patient's mother, Betty(DPR)

## 2020-09-10 NOTE — Telephone Encounter (Signed)
Lm for patient.  

## 2020-09-10 NOTE — Telephone Encounter (Signed)
The issue may be that they need to make sure the POC can provide the liter flow that she needs and that she can do on demand supplementation.  But if the POC that she is intending to by can meet those demands then we can send the prescription in.

## 2020-09-10 NOTE — Telephone Encounter (Signed)
Yes okay to do POC eval in the office.

## 2020-09-11 NOTE — Telephone Encounter (Signed)
appt scheduled for 09/16/2020 at 3:00. Patient's mother, Kathie Rhodes is aware and voiced her understanding.  Nothing further needed at this time.

## 2020-09-16 ENCOUNTER — Ambulatory Visit: Payer: Medicaid Other

## 2020-09-16 ENCOUNTER — Other Ambulatory Visit: Payer: Self-pay

## 2020-09-16 ENCOUNTER — Ambulatory Visit: Admit: 2020-09-16 | Discharge: 2020-09-17 | Payer: MEDICAID

## 2020-09-16 DIAGNOSIS — J8410 Pulmonary fibrosis, unspecified: Secondary | ICD-10-CM

## 2020-09-16 NOTE — Telephone Encounter (Signed)
Patient in office for POC eval. Unable to qualify patient for POC, due to sats of 88% on 3 pulse at rest upon arrival. Increased to 4L spo2 at 89%. Spoke with Buelah Manis, NP and made aware of sats. Beth was agreeable with not proceeding with POC eval.  Routing to Dr. Jayme Cloud as an Lorain Childes.

## 2020-09-16 NOTE — Progress Notes (Signed)
Patient in office for POC eval. Unable to qualify patient for POC, due to sats of 88% on 3 pulse upon arrival. Increased to 4L spo2 increased to 89%. Spoke with Buelah Manis, NP made aware of sats.

## 2020-09-17 ENCOUNTER — Ambulatory Visit
Admit: 2020-09-17 | Discharge: 2020-09-18 | Payer: MEDICAID | Attending: Foot & Ankle Surgery | Primary: Foot & Ankle Surgery

## 2020-09-17 DIAGNOSIS — M25375 Other instability, left foot: Principal | ICD-10-CM

## 2020-09-17 DIAGNOSIS — Z189 Retained foreign body fragments, unspecified material: Principal | ICD-10-CM

## 2020-09-17 DIAGNOSIS — Z9889 Other specified postprocedural states: Principal | ICD-10-CM

## 2020-09-17 DIAGNOSIS — E11621 Type 2 diabetes mellitus with foot ulcer: Principal | ICD-10-CM

## 2020-09-17 DIAGNOSIS — L97422 Non-pressure chronic ulcer of left heel and midfoot with fat layer exposed: Principal | ICD-10-CM

## 2020-09-23 LAB — TOXASSURE SELECT 13 (MW), URINE

## 2020-09-26 DIAGNOSIS — M0579 Rheumatoid arthritis with rheumatoid factor of multiple sites without organ or systems involvement: Principal | ICD-10-CM

## 2020-09-26 MED ORDER — HYDROXYCHLOROQUINE 200 MG TABLET
ORAL_TABLET | Freq: Two times a day (BID) | ORAL | 2 refills | 30 days | Status: CP
Start: 2020-09-26 — End: 2021-09-26
  Filled 2020-09-29: qty 60, 30d supply, fill #0

## 2020-09-30 ENCOUNTER — Other Ambulatory Visit: Payer: Self-pay | Admitting: Pulmonary Disease

## 2020-09-30 MED ORDER — FAMOTIDINE 20 MG TABLET
ORAL_TABLET | Freq: Every day | ORAL | 0 refills | 90 days | Status: CP
Start: 2020-09-30 — End: 2021-09-30

## 2020-09-30 MED ORDER — CHOLECALCIFEROL (VITAMIN D3) 125 MCG (5,000 UNIT) CAPSULE
ORAL_CAPSULE | Freq: Every day | ORAL | 0 refills | 90 days | Status: CP
Start: 2020-09-30 — End: 2021-09-30

## 2020-10-06 DIAGNOSIS — E1169 Type 2 diabetes mellitus with other specified complication: Principal | ICD-10-CM

## 2020-10-06 DIAGNOSIS — Z794 Long term (current) use of insulin: Principal | ICD-10-CM

## 2020-10-06 MED ORDER — DEXCOM G6 SENSOR DEVICE
11 refills | 0 days | Status: CP
Start: 2020-10-06 — End: ?

## 2020-10-10 ENCOUNTER — Ambulatory Visit: Admit: 2020-10-10 | Discharge: 2020-10-11 | Payer: MEDICAID

## 2020-10-10 ENCOUNTER — Ambulatory Visit
Admit: 2020-10-10 | Discharge: 2020-10-11 | Payer: MEDICAID | Attending: Foot & Ankle Surgery | Primary: Foot & Ankle Surgery

## 2020-10-10 DIAGNOSIS — E11621 Type 2 diabetes mellitus with foot ulcer: Principal | ICD-10-CM

## 2020-10-10 DIAGNOSIS — L97422 Non-pressure chronic ulcer of left heel and midfoot with fat layer exposed: Principal | ICD-10-CM

## 2020-10-10 DIAGNOSIS — R2242 Localized swelling, mass and lump, left lower limb: Principal | ICD-10-CM

## 2020-10-13 ENCOUNTER — Other Ambulatory Visit: Payer: Self-pay

## 2020-10-13 ENCOUNTER — Encounter: Payer: Self-pay | Admitting: Pulmonary Disease

## 2020-10-13 ENCOUNTER — Ambulatory Visit (INDEPENDENT_AMBULATORY_CARE_PROVIDER_SITE_OTHER): Payer: Medicaid Other | Admitting: Pulmonary Disease

## 2020-10-13 ENCOUNTER — Inpatient Hospital Stay: Admit: 2020-10-13 | Payer: MEDICAID

## 2020-10-13 ENCOUNTER — Ambulatory Visit: Admit: 2020-10-13 | Discharge: 2020-10-14 | Payer: MEDICAID | Attending: Family | Primary: Family

## 2020-10-13 VITALS — BP 118/60 | HR 60 | Temp 97.8°F | Ht 66.0 in | Wt 298.0 lb

## 2020-10-13 DIAGNOSIS — Z1211 Encounter for screening for malignant neoplasm of colon: Principal | ICD-10-CM

## 2020-10-13 DIAGNOSIS — R29898 Other symptoms and signs involving the musculoskeletal system: Principal | ICD-10-CM

## 2020-10-13 DIAGNOSIS — Z794 Long term (current) use of insulin: Principal | ICD-10-CM

## 2020-10-13 DIAGNOSIS — J449 Chronic obstructive pulmonary disease, unspecified: Principal | ICD-10-CM

## 2020-10-13 DIAGNOSIS — I5032 Chronic diastolic (congestive) heart failure: Principal | ICD-10-CM

## 2020-10-13 DIAGNOSIS — Z1212 Encounter for screening for malignant neoplasm of rectum: Principal | ICD-10-CM

## 2020-10-13 DIAGNOSIS — E039 Hypothyroidism, unspecified: Principal | ICD-10-CM

## 2020-10-13 DIAGNOSIS — Z89429 Acquired absence of other toe(s), unspecified side: Principal | ICD-10-CM

## 2020-10-13 DIAGNOSIS — M069 Rheumatoid arthritis, unspecified: Principal | ICD-10-CM

## 2020-10-13 DIAGNOSIS — E1165 Type 2 diabetes mellitus with hyperglycemia: Principal | ICD-10-CM

## 2020-10-13 DIAGNOSIS — R258 Other abnormal involuntary movements: Principal | ICD-10-CM

## 2020-10-13 DIAGNOSIS — R5381 Other malaise: Secondary | ICD-10-CM

## 2020-10-13 DIAGNOSIS — Z8616 Personal history of COVID-19: Secondary | ICD-10-CM

## 2020-10-13 DIAGNOSIS — I82401 Acute embolism and thrombosis of unspecified deep veins of right lower extremity: Secondary | ICD-10-CM

## 2020-10-13 DIAGNOSIS — J9611 Chronic respiratory failure with hypoxia: Secondary | ICD-10-CM | POA: Diagnosis not present

## 2020-10-13 DIAGNOSIS — R0602 Shortness of breath: Secondary | ICD-10-CM

## 2020-10-13 DIAGNOSIS — J8410 Pulmonary fibrosis, unspecified: Secondary | ICD-10-CM

## 2020-10-13 NOTE — Progress Notes (Signed)
Subjective:    Patient ID: [name] [name], female    DOB: Feb 03, 1961, 60 y.o.   MRN: [id number] Chief Complaint  Patient presents with   Follow-up    C/o sob with exertion.     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  A scheduled visit.  I last saw her on 10 July 2020.  Subsequently after that she was admitted on 15 August 2020 through 28 Aug 2020 at [facility].  She was diagnosed with diastolic heart failure and COPD with acute exacerbation.  She also had atrial fibrillation with RVR and was noted to be in septic shock with presumed aspiration pneumonia.  Her A. fib responded and converted spontaneously and she is now on metoprolol.  That admission she has been doing well.  She is now on liters per minute nasal cannula O2.  She is saturating 100% and I suspect that she will be able to be weaned off of this.  Today she looks well.  She presents in a transport chair but she no longer has a cast on her left lower extremity.  She has been able to increase her activity with the help of PT.  She notes improvement on her overall state.  She is more engaging and actually somewhat jovial today.  She does not have any active complaints and feels that she is back to baseline from even before COVID-19.  She feels well and looks well.  Review of Systems A 10 point review of systems was performed and it is as noted above otherwise negative.  Patient Active Problem List   Diagnosis Date Noted   DVT (deep vein thrombosis) in pregnancy 05/27/2020   DVT of popliteal vein (HCC) 05/27/2020   Postinflammatory pulmonary fibrosis (HCC) 02/07/2020   Chronic respiratory failure with hypoxia (HCC) 02/07/2020   Anemia 02/07/2020   Lower extremity cellulitis 01/10/2020   Bacteremia    Paroxysmal A-fib (HCC)     Right arm weakness    Ileus (HCC)    Splenic infarct    Acute metabolic encephalopathy 07/08/2019   Hyperglycemia due to type 2 diabetes mellitus (HCC) 07/08/2019   Morbid obesity with BMI of 50.0-59.9, adult (HCC) 07/08/2019   Severe sepsis with septic shock (HCC) 07/08/2019   Septic shock (HCC) 07/08/2019   History of severe acute respiratory syndrome coronavirus 2 (SARS-CoV-2) disease 05/24/2019   Acute respiratory disease due to COVID-19 virus 04/06/2019   Type 2 diabetes mellitus without complication, without long-term current use of insulin (HCC) 04/06/2019   CKD (chronic kidney disease), stage III (HCC) 04/06/2019   HLD (hyperlipidemia) 04/06/2019   COVID-19 03/27/2019   Pneumonia due to COVID-19 virus 03/27/2019   Abdominal pain 02/12/2019   AKI (acute kidney injury) (HCC) 02/12/2019   Cellulitis, abdominal wall 02/04/2019   Elevated C-reactive protein (CRP) 12/15/2018   Elevated sed rate 12/15/2018   Pharmacologic therapy 11/06/2018   Disorder of skeletal system 11/06/2018   Problems influencing health status 11/06/2018   Decubitus ulcer of heel, bilateral 02/21/2018   Diabetic ulcer of toe of right foot associated with type 2 diabetes mellitus (HCC) 02/14/2018   Ulcer of left heel and midfoot with fat layer exposed (HCC) 02/14/2018   Sepsis due to methicillin resistant Staphylococcus aureus (MRSA) without acute organ dysfunction (HCC) 11/17/2017   MRSA (methicillin resistant Staphylococcus aureus)  infection 11/17/2017   Cellulitis 11/15/2017   Perineal abscess 11/15/2017   Subacute vulvitis 11/01/2017   Chronic cystitis 03/01/2017   Chronic pain syndrome 03/31/2016   Insomnia secondary to chronic pain 03/31/2016   Chronic upper back pain 12/25/2015   Chronic hand pain (Bilateral) (L>R) 12/25/2015   Rheumatoid arthritis (HCC) 12/25/2015   Osteoarthritis, multiple sites 12/24/2015   Chronic foot pain (Primary Area of Pain) (Bilateral) (L>R) 12/24/2015   Chronic elbow  pain (Third area of Pain) (Bilateral) (L>R) 12/24/2015   Chronic shoulder pain (Bilateral) (L>R) 12/24/2015   Chronic neck pain (Bilateral) (R>L) 12/24/2015   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[employer]) 12/23/2015   Chronic knee pain (Bilateral) (R>L) 12/23/2015   Long term current use of opiate analgesic 12/23/2015   Long term prescription opiate use 12/23/2015   Opiate use (30 MME/Day) 12/23/2015   Neurogenic pain 12/23/2015   Neuropathic pain 12/23/2015   Diabetic peripheral neuropathy (HCC) 12/23/2015   Encounter for therapeutic drug level monitoring 12/23/2015   Encounter for pain management planning 12/23/2015   GERD (gastroesophageal reflux disease) 11/25/2015   Neuropathy 11/25/2015   Hypokalemia due to loss of potassium 10/21/2015   Hypomagnesemia 10/21/2015   QT prolongation 10/21/2015   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) 10/21/2015   Chronic wrist pain (Secondary area of Pain) (Bilateral) (L>R) 03/19/2015   Adhesive capsulitis 03/19/2015   Female genuine stress incontinence 02/14/2015   Urge incontinence of urine 02/14/2015   OSA (obstructive sleep apnea) 07/03/2014   Chronic diastolic CHF (congestive heart failure) (HCC) 07/03/2014   Anxiety 01/03/2014   Diabetic ulcer of heel (HCC) 01/03/2014   COPD (chronic obstructive pulmonary disease) (HCC) 01/03/2014   Bipolar disorder, unspecified (HCC) 01/03/2014   Diastolic dysfunction 01/03/2014   Combined fat and carbohydrate induced hyperlipemia 01/03/2014   Shortness of breath 01/03/2014   Acute on chronic congestive heart failure (HCC) 01/03/2014   Incomplete bladder emptying 11/02/2012   Bladder retention 10/10/2012   Detrusor muscle hypertonia 10/04/2012   Obstruction of urinary tract 10/04/2012   FOM (frequency of micturition) 10/04/2012   Mixed incontinence 10/04/2012   Vitamin D insufficiency 04/15/2012   Borderline personality disorder (HCC) 01/06/2012   Anxiety and  depression 01/06/2012   History of laparoscopic adjustable gastric banding, 03/20/2007.  Removed 09/19/2011. 08/04/2011   Nausea & vomiting 08/04/2011   Hypothyroidism 06/28/2010   Rheumatoid arthritis involving multiple sites with positive rheumatoid factor (HCC) 06/28/2010   Obesity 03/29/2005   Essential (primary) hypertension 01/27/2005   Major depressive disorder, recurrent episode, moderate (HCC) 06/03/2004   Social History   Tobacco Use   Smoking status: Former    Packs/day: 2.00    Years: 27.00    Pack years: 54.00    Types: Cigarettes    Quit date: 07/30/1999    Years since quitting: 21.2   Smokeless tobacco: Never   Tobacco comments:    quit in 2001 2-2.5 a day  Substance Use Topics   Alcohol use: No   Allergies  Allergen Reactions   Cephalexin Hives   Codeine Palpitations, Nausea Only, Nausea And Vomiting, Rash and Shortness Of Breath    "makes heart fly, she gets flushed and passes out"   Doxycycline Rash   Propoxyphene Rash and Shortness Of Breath    Increase heart rate   Sulfa Antibiotics Palpitations, Nausea Only, Shortness Of Breath and Hives    "makes heart fly, she gets flushed and passes out"   Clindamycin Rash    Tongue swelling, oral sores,  and Mouth rash   Lovenox [Enoxaparin Sodium] Hives   Hydrocodone Nausea And Vomiting    Hear racing & breaks out into a cold sweat.   Meropenem Rash    Erythematous, hot, pruritic rash over arms, chest, back, abdomen, and face occurred at the end of meropenem infusion on 02/22/18   Current Meds  Medication Sig   acetaminophen (TYLENOL) 325 MG tablet Take 2 tablets (650 mg total) by mouth every 6 (six) hours as needed for mild pain or moderate pain (or Fever >/= 101).   ALPRAZolam (XANAX) 0.5 MG tablet Take 1 tablet (0.5 mg total) by mouth 2 (two) times daily as needed for anxiety.   ARIPiprazole (ABILIFY) 5 MG tablet Take 1 tablet (5 mg total) by mouth daily.   ascorbic acid (VITAMIN C) 500 MG tablet Take 1 tablet  (500 mg total) by mouth daily.   atorvastatin (LIPITOR) 80 MG tablet Take 80 mg by mouth daily.    budesonide (PULMICORT) 0.5 MG/2ML nebulizer solution Take 2 mLs (0.5 mg total) by nebulization 2 (two) times daily.   calcium citrate-vitamin D 500-500 MG-UNIT chewable tablet Chew 0.5 tablets by mouth daily.   Cholecalciferol (VITAMIN D) 125 MCG (5000 UT) CAPS Take 1 capsule by mouth daily.   ELIQUIS 5 MG TABS tablet TAKE 1 TABLET BY MOUTH TWICE DAILY   famotidine (PEPCID) 20 MG tablet Take 1 tablet (20 mg total) by mouth daily.   FLUoxetine (PROZAC) 20 MG capsule TAKE (1) CAPSULE BY MOUTH ONCE DAILY.   folic acid (FOLVITE) 1 MG tablet Take 1 mg by mouth daily.    furosemide (LASIX) 20 MG tablet TAKE (1) TABLET BY MOUTH DAILY AS NEEDED.   hydroxychloroquine (PLAQUENIL) 200 MG tablet Take 2 tablets by mouth.   insulin lispro (HUMALOG) 100 UNIT/ML injection Inject 0.08 mLs (8 Units total) into the skin 3 (three) times daily before meals.   ipratropium-albuterol (DUONEB) 0.5-2.5 (3) MG/3ML SOLN Take 3 mLs by nebulization 3 (three) times daily.   levothyroxine (SYNTHROID, LEVOTHROID) 88 MCG tablet Take 88 mcg by mouth daily before breakfast.   metFORMIN (GLUCOPHAGE-XR) 500 MG 24 hr tablet Take 500 mg by mouth daily.   methotrexate 2.5 MG tablet Take 4 tablets by mouth once a week.   mupirocin ointment (BACTROBAN) 2 % Apply 1 application topically 2 (two) times daily. Intranasal - to affected area.   oxyCODONE-acetaminophen (PERCOCET) 5-325 MG tablet Take 1 tablet by mouth every 6 (six) hours as needed for severe pain. Must last 30 days.   [START ON 11/08/2020] oxyCODONE-acetaminophen (PERCOCET) 5-325 MG tablet Take 1 tablet by mouth every 6 (six) hours as needed for severe pain. Must last 30 days.   polyethylene glycol (MIRALAX / GLYCOLAX) 17 g packet Take 17 g by mouth daily as needed for mild constipation.   pramipexole (MIRAPEX) 0.125 MG tablet Take 0.25 mg by mouth at bedtime.    pregabalin  (LYRICA) 150 MG capsule Take 1 capsule (150 mg total) by mouth 3 (three) times daily.   QUEtiapine (SEROQUEL) 300 MG tablet TAKE ONE TABLET BY MOUTH AT BEDTIME.   solifenacin (VESICARE) 5 MG tablet Take 5 mg by mouth daily.   TRADJENTA 5 MG TABS tablet Take 5 mg by mouth daily.   vancomycin IVPB Inject 500 mg into the vein. Unknown dose.  Given at home via picc line   zinc sulfate 220 (50 Zn) MG capsule Take 220 mg by mouth at bedtime.    zolpidem (AMBIEN) 5 MG tablet Take 1  tablet (5 mg total) by mouth at bedtime.   Immunization History  Administered Date(s) Administered   Influenza, Seasonal, Injecte, Preservative Fre 02/04/2005   Influenza,inj,Quad PF,6+ Mos 04/14/2015, 03/23/2017, 02/07/2018, 02/01/2019, 02/07/2020   Influenza-Unspecified 02/02/2019   PFIZER(Purple Top)SARS-COV-2 Vaccination 03/18/2020, 04/08/2020, 06/03/2020   Pneumococcal Conjugate-13 04/22/2014   Pneumococcal Polysaccharide-23 03/23/2017, 04/16/2019       Objective:   Physical Exam BP 118/60 (BP Location: Left Arm, Cuff Size: Normal)   Pulse 60   Temp 97.8 F (36.6 C) (Temporal)   Ht  (1.676 m)   Wt 298 lb (135.2 kg)   LMP 04/20/2001   SpO2 100%   BMI 48.10 kg/m  GENERAL: Morbidly obese woman, presents in transport chair.  No respiratory distress.  Comfortable with nasal cannula O2 oxygen on.  No conversational dyspnea. HEAD: Normocephalic, atraumatic. EYES: Pupils equal, round, reactive to light.  No scleral icterus. MOUTH: Nose/mouth/throat not examined due to masking requirements for COVID 19. NECK: Supple. No thyromegaly. Trachea midline. No JVD.  No adenopathy. PULMONARY: Good air entry bilaterally.  No adventitious sounds. CARDIOVASCULAR: S1 and S2. Regular rate and rhythm.  No rubs, murmurs or gallops heard. ABDOMEN: Obese, soft, nondistended. MUSCULOSKELETAL: RA changes both hands.  No clubbing or edema noted, no increased warmth in the lower extremities.  No dressings in the lower  extremities.  Chronic stasis changes but no overt edema. NEUROLOGIC: No overt focal deficit, speech is fluent. SKIN: Intact,warm,dry.  Chronic stasis changes lower extremities.   PSYCH: More engaging today, jovial.  No psychomotor retardation.     Assessment & Plan:     ICD-10-CM   1. Postinflammatory pulmonary fibrosis (HCC)  J84.10    This is post COVID-19 Appears to be slowly improving Getting physical therapy at home Wean O2 off as tolerated    2. Chronic respiratory failure with hypoxia (HCC)  J96.11    Weaning O2 off as tolerated    3. Shortness of breath  R06.02    Improving day today Getting better as conditioning improves Loss can be beneficial    4. Rheumatoid arthritis  M06.9    This issue adds complexity to her management Vis--vis her postinflammatory pulmonary fibrosis On methotrexate    5. Physical deconditioning  R53.81    Main issue plaguing her right now To get physical therapy at home    6. Acute deep vein thrombosis (DVT) of right lower extremity, unspecified vein (HCC)  I82.401    On Eliquis Recommend indefinite anticoagulation She has multiple risk factors for thrombosis    7. Personal history of COVID-19  Z86.16      Well compensated from the pulmonary standpoint.  Her main issue is that of obesity and deconditioning.  Her dyspnea and oxygen requirements are decreasing with improving conditioning.  We will strive to wean off oxygen as tolerated.  We will see her in follow-up in 3 months time.  We will perform ambulatory oximetry at that time off of O2 to see how she does.  She is to contact us prior to that time should any new difficulties arise   C. Danice Goltz, MD [facility]   *This note was dictated using voice recognition software/Dragon.  Despite best efforts to proofread, errors can occur which can change the meaning.  Any change was purely unintentional.

## 2020-10-13 NOTE — Patient Instructions (Addendum)
Glad to see you doing well today.  We will see him in follow-up in 3 months time.

## 2020-10-15 ENCOUNTER — Encounter: Payer: Self-pay | Admitting: Pulmonary Disease

## 2020-10-21 MED ORDER — CELECOXIB 100 MG CAPSULE
ORAL_CAPSULE | Freq: Two times a day (BID) | ORAL | 0 refills | 7.00000 days | Status: CP | PRN
Start: 2020-10-21 — End: 2020-10-28

## 2020-10-30 MED FILL — HYDROXYCHLOROQUINE 200 MG TABLET: ORAL | 30 days supply | Qty: 60 | Fill #1

## 2020-10-31 ENCOUNTER — Other Ambulatory Visit: Payer: Self-pay | Admitting: Pulmonary Disease

## 2020-10-31 MED ORDER — METOPROLOL TARTRATE 25 MG TABLET
ORAL_TABLET | Freq: Two times a day (BID) | ORAL | 0 refills | 90.00000 days | Status: CP
Start: 2020-10-31 — End: 2021-10-31

## 2020-10-31 MED ORDER — LEVOTHYROXINE 88 MCG TABLET
ORAL_TABLET | Freq: Every day | ORAL | 0 refills | 90 days | Status: CP
Start: 2020-10-31 — End: 2021-10-31

## 2020-10-31 MED ORDER — PEN NEEDLE, DIABETIC 31 GAUGE X 5/16" (8 MM)
Freq: Three times a day (TID) | TRANSDERMAL | 0 refills | 0 days | Status: CP
Start: 2020-10-31 — End: ?

## 2020-10-31 MED ORDER — HYDROXYZINE HCL 25 MG TABLET
ORAL_TABLET | Freq: Four times a day (QID) | ORAL | 0 refills | 15 days | Status: CP | PRN
Start: 2020-10-31 — End: 2021-10-31

## 2020-11-06 ENCOUNTER — Other Ambulatory Visit: Payer: Self-pay

## 2020-11-06 ENCOUNTER — Ambulatory Visit
Admission: RE | Admit: 2020-11-06 | Discharge: 2020-11-06 | Disposition: A | Discharge status: Home / Self Care | Payer: Medicaid Other | Source: Ambulatory Visit | Attending: Adult Health | Admitting: Adult Health

## 2020-11-06 DIAGNOSIS — I82401 Acute embolism and thrombosis of unspecified deep veins of right lower extremity: Secondary | ICD-10-CM

## 2020-11-07 NOTE — Progress Notes (Signed)
Called and spoke with patient, advised of results/recommendations per Tammy Parrett NP.  She verbalized understanding.  Nothing further needed.

## 2020-11-12 ENCOUNTER — Ambulatory Visit: Admit: 2020-11-12 | Discharge: 2020-11-13 | Payer: MEDICAID

## 2020-11-12 ENCOUNTER — Ambulatory Visit
Admit: 2020-11-12 | Discharge: 2020-11-13 | Payer: MEDICAID | Attending: Foot & Ankle Surgery | Primary: Foot & Ankle Surgery

## 2020-11-12 DIAGNOSIS — E11621 Type 2 diabetes mellitus with foot ulcer: Principal | ICD-10-CM

## 2020-11-12 DIAGNOSIS — L97522 Non-pressure chronic ulcer of other part of left foot with fat layer exposed: Principal | ICD-10-CM

## 2020-11-12 DIAGNOSIS — Z9889 Other specified postprocedural states: Principal | ICD-10-CM

## 2020-11-12 MED ORDER — LINEZOLID 600 MG TABLET
ORAL_TABLET | Freq: Two times a day (BID) | ORAL | 0 refills | 5.00000 days | Status: CP
Start: 2020-11-12 — End: 2020-11-17

## 2020-11-18 MED ORDER — PREDNISONE 5 MG TABLET
ORAL_TABLET | ORAL | 0 refills | 0.00000 days | Status: CP
Start: 2020-11-18 — End: ?

## 2020-11-25 MED ORDER — ASCORBIC ACID (VITAMIN C) 500 MG TABLET
ORAL_TABLET | Freq: Every day | ORAL | 2 refills | 90.00000 days | Status: CP
Start: 2020-11-25 — End: 2021-11-25

## 2020-11-28 ENCOUNTER — Ambulatory Visit
Admit: 2020-11-28 | Discharge: 2020-11-28 | Payer: MEDICAID | Attending: Foot & Ankle Surgery | Primary: Foot & Ankle Surgery

## 2020-11-28 DIAGNOSIS — E11621 Type 2 diabetes mellitus with foot ulcer: Principal | ICD-10-CM

## 2020-11-28 DIAGNOSIS — Z9889 Other specified postprocedural states: Principal | ICD-10-CM

## 2020-11-28 DIAGNOSIS — L97522 Non-pressure chronic ulcer of other part of left foot with fat layer exposed: Principal | ICD-10-CM

## 2020-12-01 MED FILL — HYDROXYCHLOROQUINE 200 MG TABLET: ORAL | 30 days supply | Qty: 60 | Fill #2

## 2020-12-03 DIAGNOSIS — L97521 Non-pressure chronic ulcer of other part of left foot limited to breakdown of skin: Principal | ICD-10-CM

## 2020-12-03 DIAGNOSIS — L97423 Non-pressure chronic ulcer of left heel and midfoot with necrosis of muscle: Principal | ICD-10-CM

## 2020-12-03 DIAGNOSIS — E11621 Type 2 diabetes mellitus with foot ulcer: Principal | ICD-10-CM

## 2020-12-03 MED ORDER — LEVOFLOXACIN 500 MG TABLET
ORAL_TABLET | Freq: Every day | ORAL | 0 refills | 14 days | Status: CP
Start: 2020-12-03 — End: 2020-12-17

## 2020-12-04 ENCOUNTER — Other Ambulatory Visit: Payer: Self-pay

## 2020-12-04 ENCOUNTER — Ambulatory Visit
Payer: Medicaid Other | Attending: Student in an Organized Health Care Education/Training Program | Admitting: Student in an Organized Health Care Education/Training Program

## 2020-12-04 ENCOUNTER — Encounter: Payer: Self-pay | Admitting: Student in an Organized Health Care Education/Training Program

## 2020-12-04 VITALS — BP 123/54 | HR 70 | Temp 97.8°F | Ht 66.0 in | Wt 312.0 lb

## 2020-12-04 DIAGNOSIS — M25532 Pain in left wrist: Secondary | ICD-10-CM | POA: Insufficient documentation

## 2020-12-04 DIAGNOSIS — M05712 Rheumatoid arthritis with rheumatoid factor of left shoulder without organ or systems involvement: Secondary | ICD-10-CM | POA: Insufficient documentation

## 2020-12-04 DIAGNOSIS — E1142 Type 2 diabetes mellitus with diabetic polyneuropathy: Secondary | ICD-10-CM | POA: Insufficient documentation

## 2020-12-04 DIAGNOSIS — G894 Chronic pain syndrome: Secondary | ICD-10-CM | POA: Diagnosis present

## 2020-12-04 DIAGNOSIS — M792 Neuralgia and neuritis, unspecified: Secondary | ICD-10-CM | POA: Insufficient documentation

## 2020-12-04 DIAGNOSIS — M05711 Rheumatoid arthritis with rheumatoid factor of right shoulder without organ or systems involvement: Secondary | ICD-10-CM | POA: Diagnosis present

## 2020-12-04 DIAGNOSIS — G8929 Other chronic pain: Secondary | ICD-10-CM | POA: Diagnosis present

## 2020-12-04 DIAGNOSIS — M25529 Pain in unspecified elbow: Secondary | ICD-10-CM | POA: Insufficient documentation

## 2020-12-04 DIAGNOSIS — M79673 Pain in unspecified foot: Secondary | ICD-10-CM | POA: Insufficient documentation

## 2020-12-04 DIAGNOSIS — M25531 Pain in right wrist: Secondary | ICD-10-CM | POA: Insufficient documentation

## 2020-12-04 MED ORDER — OXYCODONE-ACETAMINOPHEN 5-325 MG PO TABS: 1.0000 | ORAL_TABLET | Freq: Four times a day (QID) | ORAL | 0 refills | Status: DC | PRN

## 2020-12-04 NOTE — Progress Notes (Signed)
PROVIDER NOTE: Information contained herein reflects review and annotations entered in association with encounter. Interpretation of such information and data should be left to medically-trained personnel. Information provided to patient can be located elsewhere in the medical record under "Patient Instructions". Document created using STT-dictation technology, any transcriptional errors that may result from process are unintentional.    Patient: [name] [name]  Service Category: E/M  Provider: Gillis Santa, MD  DOB: 1960-11-09  DOS: 12/04/2020  Specialty: Interventional Pain Management  MRN: [id number]  Setting: Ambulatory outpatient  PCP: Care, [place] [facility]  Type: Established Patient    Referring Provider: [facility], [place] [facility]  Location: Office  Delivery: Face-to-face     HPI  [name] [name], a 60 y.o. year old female, is here today because of her Diabetic peripheral neuropathy ([place]) [E11.42]. [name] [name] primary complain today is Other and Pain (All joints, fingers are the worse today) Last encounter: My last encounter with her was on 5/242022. Pertinent problems: [name] [name] has Chronic wrist pain (Secondary area of Pain) (Bilateral) (L>R); Chronic knee pain (Bilateral) (R>L); Long term current use of opiate analgesic; Opiate use (30 MME/Day); Neurogenic pain; Neuropathic pain; Diabetic peripheral neuropathy ([place]); Osteoarthritis, multiple sites; Chronic foot pain (Primary Area of Pain) (Bilateral) (L>R); Chronic elbow pain (Third area of Pain) (Bilateral) (L>R); Chronic upper back pain; Chronic pain syndrome; Rheumatoid arthritis involving multiple sites with positive rheumatoid factor ([place]); CKD (chronic kidney disease), stage III ([place]); Morbid obesity with BMI of 50.0-59.9, adult ([place]); and Severe sepsis with septic shock ([place]) on their pertinent problem list. Pain Assessment: Severity of Chronic pain is reported as a 2 /10. Location: Hand (fingers worse today)  (entire body)/denies. Onset:  More than a month ago. Quality: Aching, Constant, Sharp. Timing: Constant. Modifying factor(s): medications, heat. Vitals:  height is 5' 6"  (1.676 m) and weight is 312 lb (141.5 kg) (abnormal). Her temperature is 97.8 F (36.6 C). Her blood pressure is 123/54 (abnormal) and her pulse is 70. Her oxygen saturation is 95%.   Reason for encounter: medication management.    Patient presents today for medication management.  She continues to struggle with respiratory distress is on 3 L of oxygen and sats between 88 to 92%.  She states that she gets short of breath easily.  She has chronic pulmonary scarring from COVID and prolonged hospitalization.  She was recently started on antibiotics for a ulcer related to her diabetes.  Overall her blood sugars have been better controlled and she states her last A1c was less than 7 which is significantly less than her previous A1c which was greater than 11.  We discussed the risks of chronic opioid therapy in the context of comorbid cardiovascular and respiratory disease.  We had previously plan on reducing her Percocet/weaning to 3 times daily as needed but the patient wants to hold off on this given acute pain from cellulitis.  I informed her that we will wean at the next visit to quantity 90.  Patient in agreement with plan.  UDS up-to-date and appropriate.    Pharmacotherapy Assessment  Analgesic: Percocet 5 mg 4 times daily as needed, quantity 120/month.  We will plan on weaning to quantity 90 at next visit after her acute on chronic left foot pain from infection heals.   Monitoring: [place] PMP: PDMP not reviewed this encounter.       Pharmacotherapy: No side-effects or adverse reactions reported. Compliance: No problems identified. Effectiveness: Clinically acceptable.  UDS:  Summary  Date Value Ref Range  Status  09/16/2020 Note  Final    Comment:    ==================================================================== [employer] [facility] 13  (MW) ==================================================================== Test                             Result       Flag       Units  Drug Present and Declared for Prescription Verification   Oxycodone                      615          EXPECTED   ng/mg creat   Noroxycodone                   587          EXPECTED   ng/mg creat    Sources of oxycodone include scheduled prescription medications.    Noroxycodone is an expected metabolite of oxycodone.  Drug Absent but Declared for Prescription Verification   Alprazolam                     Not Detected UNEXPECTED ng/mg creat ==================================================================== Test                      Result    Flag   Units      Ref Range   Creatinine              139              mg/dL      >=20 ==================================================================== Declared Medications:  The flagging and interpretation on this report are based on the  following declared medications.  Unexpected results may arise from  inaccuracies in the declared medications.   **Note: The testing scope of this panel includes these medications:   Alprazolam (Xanax)  Oxycodone (Percocet)   **Note: The testing scope of this panel does not include the  following reported medications:   Acetaminophen (Tylenol)  Acetaminophen (Percocet)  Albuterol (Duoneb)  Apixaban (Eliquis)  Aripiprazole (Abilify)  Atorvastatin  Budesonide (Pulmicort)  Calcium  Cholecalciferol  Fluoxetine (Prozac)  Folic Acid  Furosemide  Insulin (Humalog)  Ipratropium (Duoneb)  Levothyroxine  Linagliptin (Tradjenta)  Metformin  Mupirocin  Polyethylene Glycol (MiraLAX)  Pramipexole (Mirapex)  Pregabalin (Lyrica)  Quetiapine (Seroquel)  Solifenacin (Vesicare)  Vancomycin  Vitamin C  Vitamin D  Zinc  Zolpidem (Ambien) ==================================================================== For clinical consultation, please call [phone]. ====================================================================       ROS  Constitutional:  Intermittent shortness of breath Gastrointestinal: No reported hemesis, hematochezia, vomiting, or acute GI distress Musculoskeletal:  Low back pain, bilateral leg pain Neurological: No reported episodes of acute onset apraxia, aphasia, dysarthria, agnosia, amnesia, paralysis, loss of coordination, or loss of consciousness  Medication Review  ALPRAZolam, ARIPiprazole, FLUoxetine, QUEtiapine, Vitamin D, acetaminophen, apixaban, ascorbic acid, atorvastatin, budesonide, busPIRone, calcium citrate-vitamin D, famotidine, folic acid, furosemide, hydroxychloroquine, insulin lispro, ipratropium-albuterol, levofloxacin, levothyroxine, linagliptin, metFORMIN, methotrexate, metoprolol tartrate, mupirocin ointment, oxyCODONE-acetaminophen, polyethylene glycol, pramipexole, pregabalin, solifenacin, vancomycin, zinc sulfate, and zolpidem  History Review  Allergy: [name] [name] is allergic to cephalexin, codeine, doxycycline, propoxyphene, sulfa antibiotics, clindamycin, lovenox [enoxaparin sodium], hydrocodone, and meropenem. Drug: [name] [name]  reports no history of drug use. Alcohol:  reports no history of alcohol use. Tobacco:  reports that she quit smoking about 21 years ago. Her smoking use included cigarettes. She has a 54.00 pack-year smoking history. She has never used  smokeless tobacco. Social: [name] [name]  reports that she quit smoking about 21 years ago. Her smoking use included cigarettes. She has a 54.00 pack-year smoking history. She has never used smokeless tobacco. She reports that she does not drink alcohol and does not use drugs. Medical:  has a past medical history of Abdominal wall hernia (01/29/2013), Anxiety, Arthritis, C. difficile colitis, Chronic diastolic heart failure ([place]), COVID-19 (03/23/2019), Depression, Diabetes mellitus, Diastolic CHF ([place]), Esophagitis, Fluid retention, GERD  (gastroesophageal reflux disease), Hiatal hernia, Hypertension, Hypokalemia due to loss of potassium (10/21/2015), Hypothyroidism, IBS (irritable bowel syndrome), Moderate episode of recurrent major depressive disorder ([place]) (06/03/2004), Morbid obesity ([place]), MRSA (methicillin resistant Staphylococcus aureus) infection (11/2017), Neurogenic bladder, Neuropathy, Obesity, Panic attacks, Pneumonia due to COVID-19 virus, Rheumatoid arthritis ([place]), and Sleep apnea. Surgical: [name] [name]  has a past surgical history that includes Tubal ligation; Tonsillectomy; Cholecystectomy; Abdominal hysterectomy; Laparoscopic gastric banding (03/20/07); Eye surgery; Hernia repair; DG GREAT TOE RIGHT FOOT (02/23/2018); TEE without cardioversion (N/A, 07/16/2019); and Irrigation and debridement foot (Left, 01/11/2020). Family: family history includes Alcohol abuse in her father and sister; Anxiety disorder in her father, sister, and sister; Bipolar disorder in her father and sister; Depression in her father, sister, and sister; Drug abuse in her sister; Heart attack in her brother; Heart attack (age of onset: 52) in her brother; Heart disease in her brother; Heart failure in her father.  Laboratory Chemistry Profile   Renal Lab Results  Component Value Date   BUN 15 07/29/2020   CREATININE 0.83 07/29/2020   BCR 16 11/16/2018   GFRAA >60 01/15/2020   GFRNONAA >60 07/29/2020     Hepatic Lab Results  Component Value Date   AST 32 04/28/2020   ALT 31 04/28/2020   ALBUMIN 3.3 (L) 05/21/2020   ALKPHOS 70 04/28/2020   LIPASE 176 (H) 07/13/2019     Electrolytes Lab Results  Component Value Date   NA 138 07/29/2020   K 3.7 07/29/2020   CL 100 07/29/2020   CALCIUM 9.2 07/29/2020   MG 1.7 01/15/2020   PHOS 2.8 05/21/2020     Bone Lab Results  Component Value Date   VD25OH 37.36 04/30/2019   25OHVITD1 23 (L) 11/16/2018   25OHVITD2 2.2 11/16/2018   25OHVITD3 21 11/16/2018     Inflammation (CRP: Acute Phase)  (ESR: Chronic Phase) Lab Results  Component Value Date   CRP 2.1 (H) 07/17/2019   ESRSEDRATE 65 (H) 07/17/2019   LATICACIDVEN 1.4 04/28/2020       Note: Above Lab results reviewed.  Physical Exam  General appearance: Well nourished, well developed, and well hydrated. 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   Weight as of this encounter: 312 lb (141.5 kg). Ideal: Ideal body weight: 59.3 kg (130 lb 11.7 oz) Adjusted ideal body weight: 92.2 kg (203 lb 3.8 oz)   Patient comes today in a wheelchair.  Morbidly obese.  Lower extremity edema, pitting Low back pain, bilateral hip pain, bilateral knee  pain Neuropathic pain of bilateral lower extremity Musculoskeletal pain overlying various joints most pronounced in her elbow, wrist, hips, low back  Assessment   Diagnosis  1. Diabetic peripheral neuropathy ([place])   2. Neurogenic pain   3. Neuropathic pain   4. Chronic elbow pain (Third area of Pain) (Bilateral) (L>R)   5. Rheumatoid arthritis involving both shoulders with positive rheumatoid factor (H[facility])   6. Chronic foot pain (Primary Area of Pain) (Bilateral) (L>R)   7. Chronic wrist pain (Secondary area of Pain) (Bilateral) (L>R)   8. Chronic pain syndrome       Plan of Care  [name] [name] has a current medication list which includes the following long-term medication(s): aripiprazole, budesonide, calcium citrate-vitamin d, eliquis, famotidine, fluoxetine, furosemide, insulin lispro, ipratropium-albuterol, pregabalin, quetiapine, quetiapine, zolpidem, and  zolpidem.  Pharmacotherapy (Medications Ordered): Meds ordered this encounter  Medications   oxyCODONE-acetaminophen (PERCOCET) 5-325 MG tablet    Sig: Take 1 tablet by mouth every 6 (six) hours as needed for severe pain. Must last 30 days.    Dispense:  120 tablet    Refill:  0    Chronic Pain. (STOP Act - Not applicable). Fill one day early if closed on scheduled refill date.   Will wean at next visit to quantity 90, every 8 hours as needed.   Follow-up plan:   Return in about 2 months (around 02/03/2021) for Medication Management, in person.   Recent Visits Date Type Provider Dept  09/09/20 Office Visit Gillis Santa, MD [facility]  Showing recent visits within past 90 days and meeting all other requirements Today's Visits Date Type Provider Dept  12/04/20 Office Visit Gillis Santa, MD [facility]  Showing today's visits and meeting all other requirements Future Appointments Date Type Provider Dept  01/29/21 Appointment Gillis Santa, MD [facility]  Showing future appointments within next 90 days and meeting all other requirements I discussed the assessment and treatment plan with the patient. The patient was provided an opportunity to ask questions and all were answered. The patient agreed with the plan and demonstrated an understanding of the instructions.  Patient advised to call back or seek an in-person evaluation if the symptoms or condition worsens.  Duration of encounter: 30 minutes.  Note by: Gillis Santa, MD Date: 12/04/2020; Time: 11:13 AM

## 2020-12-04 NOTE — Progress Notes (Signed)
Nursing Pain Medication Assessment:  Safety precautions to be maintained throughout the outpatient stay will include: orient to surroundings, keep bed in low position, maintain call bell within reach at all times, provide assistance with transfer out of bed and ambulation.  Medication Inspection Compliance: Pill count conducted under aseptic conditions, in front of the patient. Neither the pills nor the bottle was removed from the patient's sight at any time. Once count was completed pills were immediately returned to the patient in their original bottle.  Medication: Oxycodone/APAP Pill/Patch Count:  0 of 120 pills remain Pill/Patch Appearance: Markings consistent with prescribed medication Bottle Appearance: Standard pharmacy container. Clearly labeled. Filled Date: 7 / 15 / 2022 Last Medication intake:   12/02/2020

## 2020-12-09 ENCOUNTER — Ambulatory Visit: Admit: 2020-12-09 | Discharge: 2020-12-12 | Disposition: A | Discharge status: Home Health | Payer: MEDICAID

## 2020-12-09 ENCOUNTER — Ambulatory Visit: Admit: 2020-12-09 | Discharge: 2020-12-12 | Payer: MEDICAID

## 2020-12-12 MED ORDER — METRONIDAZOLE 500 MG TABLET
ORAL_TABLET | Freq: Three times a day (TID) | ORAL | 0 refills | 11.00000 days | Status: CP
Start: 2020-12-12 — End: 2020-12-23
  Filled 2020-12-12: qty 33, 11d supply, fill #0

## 2020-12-12 MED ORDER — LEVOFLOXACIN 500 MG TABLET
ORAL_TABLET | Freq: Every day | ORAL | 0 refills | 11.00000 days | Status: CP
Start: 2020-12-12 — End: 2020-12-23

## 2020-12-12 MED ORDER — LINEZOLID 600 MG TABLET
ORAL_TABLET | Freq: Two times a day (BID) | ORAL | 0 refills | 11.00000 days | Status: CP
Start: 2020-12-12 — End: 2020-12-23
  Filled 2020-12-12: qty 22, 11d supply, fill #0

## 2020-12-14 ENCOUNTER — Encounter: Admit: 2020-12-14 | Payer: MEDICAID

## 2020-12-14 ENCOUNTER — Inpatient Hospital Stay: Admit: 2020-12-14 | Payer: MEDICAID

## 2020-12-15 ENCOUNTER — Telehealth: Payer: Self-pay | Admitting: Student in an Organized Health Care Education/Training Program

## 2020-12-15 DIAGNOSIS — E782 Mixed hyperlipidemia: Principal | ICD-10-CM

## 2020-12-15 MED ORDER — PREGABALIN 100 MG PO CAPS: 100.0000 mg | ORAL_CAPSULE | Freq: Three times a day (TID) | ORAL | 5 refills | Status: DC

## 2020-12-15 MED ORDER — ATORVASTATIN 80 MG TABLET
ORAL_TABLET | Freq: Every evening | ORAL | 1 refills | 90 days | Status: CP
Start: 2020-12-15 — End: 2021-12-15

## 2020-12-15 NOTE — Telephone Encounter (Signed)
Last prescribed 06-17-20 with 5 refills. Last appt 12-04-20.

## 2020-12-15 NOTE — Telephone Encounter (Signed)
Dose reduction to 100 mg 3 times daily.  New prescription sent in.  Requested Prescriptions   Signed Prescriptions Disp Refills   pregabalin (LYRICA) 100 MG capsule 90 capsule 5    Sig: Take 1 capsule (100 mg total) by mouth 3 (three) times daily.    Authorizing Provider: Edward Jolly

## 2020-12-15 NOTE — Telephone Encounter (Signed)
Patient notified per voicemail thatscript sent to pharmacy, with instruction for new dose.

## 2020-12-16 MED ORDER — CHOLECALCIFEROL (VITAMIN D3) 125 MCG (5,000 UNIT) CAPSULE
ORAL_CAPSULE | Freq: Every day | ORAL | 1 refills | 90 days | Status: CP
Start: 2020-12-16 — End: 2021-12-16

## 2020-12-16 MED ORDER — METFORMIN ER 500 MG TABLET,EXTENDED RELEASE 24 HR
ORAL_TABLET | 11 refills | 0.00000 days | Status: CP
Start: 2020-12-16 — End: ?

## 2020-12-16 MED ORDER — FAMOTIDINE 20 MG TABLET
ORAL_TABLET | Freq: Every day | ORAL | 1 refills | 90.00000 days | Status: CP
Start: 2020-12-16 — End: 2021-12-16

## 2020-12-19 MED ORDER — METHOTREXATE SODIUM 2.5 MG TABLET
ORAL | 0 refills | 0.00000 days
Start: 2020-12-19 — End: ?

## 2020-12-22 MED ORDER — METHOTREXATE SODIUM 2.5 MG TABLET
ORAL_TABLET | ORAL | 3 refills | 28 days | Status: CP
Start: 2020-12-22 — End: ?

## 2021-01-02 ENCOUNTER — Ambulatory Visit
Admit: 2021-01-02 | Discharge: 2021-01-03 | Payer: MEDICAID | Attending: Foot & Ankle Surgery | Primary: Foot & Ankle Surgery

## 2021-01-02 DIAGNOSIS — L97522 Non-pressure chronic ulcer of other part of left foot with fat layer exposed: Principal | ICD-10-CM

## 2021-01-02 DIAGNOSIS — Z89419 Acquired absence of unspecified great toe: Principal | ICD-10-CM

## 2021-01-02 DIAGNOSIS — E11621 Type 2 diabetes mellitus with foot ulcer: Principal | ICD-10-CM

## 2021-01-02 DIAGNOSIS — L97422 Non-pressure chronic ulcer of left heel and midfoot with fat layer exposed: Principal | ICD-10-CM

## 2021-01-09 ENCOUNTER — Other Ambulatory Visit: Payer: Self-pay | Admitting: Pulmonary Disease

## 2021-01-09 MED ORDER — METOPROLOL TARTRATE 25 MG TABLET
ORAL_TABLET | Freq: Two times a day (BID) | ORAL | 1 refills | 90 days | Status: CP
Start: 2021-01-09 — End: 2022-01-09

## 2021-01-12 ENCOUNTER — Telehealth: Payer: Self-pay

## 2021-01-12 NOTE — Telephone Encounter (Signed)
Medication refill request - Fax received from pt's [facility] requesting a new Zolpidem 5 mg refill, last provided 11/13/20.  Pt. last evaluated 08/19/20 with no current return scheduled.

## 2021-01-13 ENCOUNTER — Ambulatory Visit: Admit: 2021-01-13 | Payer: MEDICAID | Attending: Family | Primary: Family

## 2021-01-16 ENCOUNTER — Other Ambulatory Visit: Payer: Self-pay | Admitting: Psychiatry

## 2021-01-16 MED ORDER — ARIPIPRAZOLE 5 MG PO TABS: 5.0000 mg | ORAL_TABLET | Freq: Every day | ORAL | 5 refills | Status: DC

## 2021-01-16 MED ORDER — FLUOXETINE HCL 20 MG PO CAPS: ORAL_CAPSULE | ORAL | 5 refills | Status: DC

## 2021-01-16 MED ORDER — ZOLPIDEM TARTRATE 5 MG PO TABS: 5.0000 mg | ORAL_TABLET | Freq: Every day | ORAL | 5 refills | Status: DC

## 2021-01-19 ENCOUNTER — Ambulatory Visit (INDEPENDENT_AMBULATORY_CARE_PROVIDER_SITE_OTHER): Payer: Medicaid Other | Admitting: Pulmonary Disease

## 2021-01-19 ENCOUNTER — Encounter: Payer: Self-pay | Admitting: Pulmonary Disease

## 2021-01-19 ENCOUNTER — Other Ambulatory Visit: Payer: Self-pay

## 2021-01-19 VITALS — BP 114/60 | HR 71 | Temp 98.1°F | Ht 66.0 in | Wt 308.4 lb

## 2021-01-19 DIAGNOSIS — J8410 Pulmonary fibrosis, unspecified: Secondary | ICD-10-CM | POA: Diagnosis not present

## 2021-01-19 DIAGNOSIS — M069 Rheumatoid arthritis, unspecified: Secondary | ICD-10-CM

## 2021-01-19 DIAGNOSIS — R0602 Shortness of breath: Secondary | ICD-10-CM

## 2021-01-19 DIAGNOSIS — J42 Unspecified chronic bronchitis: Secondary | ICD-10-CM | POA: Diagnosis not present

## 2021-01-19 DIAGNOSIS — J9611 Chronic respiratory failure with hypoxia: Secondary | ICD-10-CM | POA: Diagnosis not present

## 2021-01-19 MED ORDER — ALBUTEROL SULFATE (2.5 MG/3ML) 0.083% IN NEBU: 2.5000 mg | INHALATION_SOLUTION | Freq: Four times a day (QID) | RESPIRATORY_TRACT | 6 refills | Status: DC | PRN

## 2021-01-19 MED ORDER — STIOLTO RESPIMAT 2.5-2.5 MCG/ACT IN AERS: 2.0000 | INHALATION_SPRAY | Freq: Every day | RESPIRATORY_TRACT | 0 refills | Status: DC

## 2021-01-19 NOTE — Patient Instructions (Signed)
STOP: Budesonide and DuoNeb (ipratropium/albuterol).  For your nebulizer we are going to send in a prescription of albuterol to use only as needed.  We are giving you a trial of Stiolto 2 inhalations once daily to see if this helps with your shortness of breath.  Does know how you do with the Stiolto so that we can call the prescription in to your pharmacy.  Decrease your nighttime oxygen to 2-1/2 L/min, we will schedule a test of the oxygen level on this amount of oxygen to see if this does better for her nasal irritation.  For the nasal irritation she can use AYR or NeilMed nasal GEL, these can be obtained over the counter, these are safe to use with oxygen.  Do not use anything with petroleum jelly in it.  We will see him in follow-up in 3 months time call sooner should any new problems arise.

## 2021-01-19 NOTE — Progress Notes (Signed)
Subjective:    Patient ID: [name] [name], female    DOB: 01-27-1961, 60 y.o.   MRN: [id number] Chief Complaint  Patient presents with   Follow-up    Pulmonary Fibrosis- patient not sure if she is having a panic attack when feeling sob.     HPI [name] is a 60 year old former smoker (81 PY) with a very complex history as noted below.  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  This is a scheduled visit.  She presents today with her mother who usually accompanies her during her physician visits.  The patient also has been noted to have right DVT and is on Eliquis.  She is currently on 3 L/min nasal cannula O2 and tolerating this well.  She does note some epistaxis with her nocturnal oxygen supplementation even with added humidifier bottle to the concentrator.  Overall though she has been doing markedly better.  She is now attempting walks around the home with a walker.  She has been on DuoNebs but finds these cumbersome though they do help her.  She has not had any orthopnea or paroxysmal nocturnal dyspnea.  She does have episodes of dyspnea that she attributes to "panic attacks" she notes that when she takes Xanax this is helped tremendously.  She does not voice any other complaint.    Review of Systems A 10 point review of systems was performed and it is as noted above otherwise negative.  Patient Active Problem List   Diagnosis Date Noted   DVT (deep vein thrombosis) in pregnancy 05/27/2020   DVT of popliteal vein (HCC) 05/27/2020   Postinflammatory pulmonary fibrosis (HCC) 02/07/2020   Chronic respiratory failure with hypoxia (HCC) 02/07/2020   Anemia 02/07/2020   Lower extremity cellulitis 01/10/2020   Bacteremia    Paroxysmal A-fib (HCC)    Right arm weakness    Ileus (HCC)    Splenic  infarct    Acute metabolic encephalopathy 07/08/2019   Hyperglycemia due to type 2 diabetes mellitus (HCC) 07/08/2019   Morbid obesity with BMI of 50.0-59.9, adult (HCC) 07/08/2019   Severe sepsis with septic shock (HCC) 07/08/2019   Septic shock (HCC) 07/08/2019   History of severe acute respiratory syndrome coronavirus 2 (SARS-CoV-2) disease 05/24/2019   Acute respiratory disease due to COVID-19 virus 04/06/2019   Type 2 diabetes mellitus without complication, without long-term current use of insulin (HCC) 04/06/2019   CKD (chronic kidney disease), stage III (HCC) 04/06/2019   HLD (hyperlipidemia) 04/06/2019   COVID-19 03/27/2019   Pneumonia due to COVID-19 virus 03/27/2019   Abdominal pain 02/12/2019   AKI (acute kidney injury) (HCC) 02/12/2019   Cellulitis, abdominal wall 02/04/2019   Elevated C-reactive protein (CRP) 12/15/2018   Elevated sed rate 12/15/2018   Pharmacologic therapy 11/06/2018   Disorder of skeletal system 11/06/2018   Problems influencing health status 11/06/2018   Decubitus ulcer of heel, bilateral 02/21/2018   Diabetic ulcer of toe of right foot associated with type 2 diabetes mellitus (HCC) 02/14/2018   Ulcer of left heel and midfoot with fat layer exposed (HCC) 02/14/2018   Sepsis due to methicillin resistant Staphylococcus aureus (MRSA) without acute organ dysfunction (HCC) 11/17/2017   MRSA (methicillin resistant Staphylococcus aureus) infection 11/17/2017   Cellulitis 11/15/2017   Perineal abscess 11/15/2017   Subacute vulvitis 11/01/2017   Chronic cystitis 03/01/2017  Chronic pain syndrome 03/31/2016   Insomnia secondary to chronic pain 03/31/2016   Chronic upper back pain 12/25/2015   Chronic hand pain (Bilateral) (L>R) 12/25/2015   Rheumatoid arthritis (HCC) 12/25/2015   Osteoarthritis, multiple sites 12/24/2015   Chronic foot pain (Primary Area of Pain) (Bilateral) (L>R) 12/24/2015   Chronic elbow pain (Third area of Pain) (Bilateral) (L>R)  12/24/2015   Chronic shoulder pain (Bilateral) (L>R) 12/24/2015   Chronic neck pain (Bilateral) (R>L) 12/24/2015   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[employer]) 12/23/2015   Chronic knee pain (Bilateral) (R>L) 12/23/2015   Long term current use of opiate analgesic 12/23/2015   Long term prescription opiate use 12/23/2015   Opiate use (30 MME/Day) 12/23/2015   Neurogenic pain 12/23/2015   Neuropathic pain 12/23/2015   Diabetic peripheral neuropathy (HCC) 12/23/2015   Encounter for therapeutic drug level monitoring 12/23/2015   Encounter for pain management planning 12/23/2015   GERD (gastroesophageal reflux disease) 11/25/2015   Neuropathy 11/25/2015   Hypokalemia due to loss of potassium 10/21/2015   Hypomagnesemia 10/21/2015   QT prolongation 10/21/2015   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) 10/21/2015   Chronic wrist pain (Secondary area of Pain) (Bilateral) (L>R) 03/19/2015   Adhesive capsulitis 03/19/2015   Female genuine stress incontinence 02/14/2015   Urge incontinence of urine 02/14/2015   OSA (obstructive sleep apnea) 07/03/2014   Chronic diastolic CHF (congestive heart failure) (HCC) 07/03/2014   Anxiety 01/03/2014   Diabetic ulcer of heel (HCC) 01/03/2014   COPD (chronic obstructive pulmonary disease) (HCC) 01/03/2014   Bipolar disorder, unspecified (HCC) 01/03/2014   Diastolic dysfunction 01/03/2014   Combined fat and carbohydrate induced hyperlipemia 01/03/2014   Shortness of breath 01/03/2014   Acute on chronic congestive heart failure (HCC) 01/03/2014   Incomplete bladder emptying 11/02/2012   Bladder retention 10/10/2012   Detrusor muscle hypertonia 10/04/2012   Obstruction of urinary tract 10/04/2012   FOM (frequency of micturition) 10/04/2012   Mixed incontinence 10/04/2012   Vitamin D insufficiency 04/15/2012   Borderline personality disorder (HCC) 01/06/2012   Anxiety and depression 01/06/2012   History of laparoscopic  adjustable gastric banding, 03/20/2007.  Removed 09/19/2011. 08/04/2011   Nausea & vomiting 08/04/2011   Hypothyroidism 06/28/2010   Rheumatoid arthritis involving multiple sites with positive rheumatoid factor (HCC) 06/28/2010   Obesity 03/29/2005   Essential (primary) hypertension 01/27/2005   Major depressive disorder, recurrent episode, moderate (HCC) 06/03/2004     Current Meds  Medication Sig   acetaminophen (TYLENOL) 325 MG tablet Take 2 tablets (650 mg total) by mouth every 6 (six) hours as needed for mild pain or moderate pain (or Fever >/= 101).   ALPRAZolam (XANAX) 0.5 MG tablet Take 1 tablet (0.5 mg total) by mouth 2 (two) times daily as needed for anxiety.   ARIPiprazole (ABILIFY) 5 MG tablet Take 1 tablet (5 mg total) by mouth daily.   ascorbic acid (VITAMIN C) 500 MG tablet Take 1 tablet (500 mg total) by mouth daily.   budesonide (PULMICORT) 0.5 MG/2ML nebulizer solution Take 2 mLs (0.5 mg total) by nebulization 2 (two) times daily.   busPIRone (BUSPAR) 10 MG tablet TAKE 1 TABLET BY MOUTH TWICE DAILY   calcium citrate-vitamin D 500-500 MG-UNIT chewable tablet Chew 0.5 tablets by mouth daily.   Cholecalciferol (VITAMIN D) 125 MCG (5000 UT) CAPS Take 1 capsule by mouth daily.   ELIQUIS 5 MG TABS tablet TAKE 1 TABLET BY MOUTH TWICE DAILY   famotidine (PEPCID) 20 MG tablet Take 1 tablet (  20 mg total) by mouth daily.   FLUoxetine (PROZAC) 20 MG capsule TAKE (1) CAPSULE BY MOUTH ONCE DAILY.   folic acid (FOLVITE) 1 MG tablet Take 1 mg by mouth daily.    furosemide (LASIX) 20 MG tablet TAKE (1) TABLET BY MOUTH DAILY AS NEEDED.   hydroxychloroquine (PLAQUENIL) 200 MG tablet Take 2 tablets by mouth.   insulin lispro (HUMALOG) 100 UNIT/ML injection Inject 0.08 mLs (8 Units total) into the skin 3 (three) times daily before meals.   ipratropium-albuterol (DUONEB) 0.5-2.5 (3) MG/3ML SOLN Take 3 mLs by nebulization 3 (three) times daily.   levothyroxine (SYNTHROID, LEVOTHROID) 88 MCG  tablet Take 88 mcg by mouth daily before breakfast.   metFORMIN (GLUCOPHAGE-XR) 500 MG 24 hr tablet Take 500 mg by mouth daily.   methotrexate 2.5 MG tablet Take 4 tablets by mouth once a week.   metoprolol tartrate (LOPRESSOR) 25 MG tablet Take 25 mg by mouth 2 (two) times daily.   mupirocin ointment (BACTROBAN) 2 % Apply 1 application topically 2 (two) times daily. Intranasal - to affected area.   oxyCODONE-acetaminophen (PERCOCET) 5-325 MG tablet Take 1 tablet by mouth every 6 (six) hours as needed for severe pain. Must last 30 days.   polyethylene glycol (MIRALAX / GLYCOLAX) 17 g packet Take 17 g by mouth daily as needed for mild constipation.   pramipexole (MIRAPEX) 0.125 MG tablet Take 0.25 mg by mouth at bedtime.    pregabalin (LYRICA) 100 MG capsule Take 1 capsule (100 mg total) by mouth 3 (three) times daily.   QUEtiapine (SEROQUEL) 300 MG tablet TAKE ONE TABLET BY MOUTH AT BEDTIME.   QUEtiapine (SEROQUEL) 300 MG tablet TAKE ONE TABLET BY MOUTH AT BEDTIME.   solifenacin (VESICARE) 5 MG tablet Take 5 mg by mouth daily.   TRADJENTA 5 MG TABS tablet Take 5 mg by mouth daily.   vancomycin IVPB Inject 500 mg into the vein. Unknown dose.  Given at home via picc line   zinc sulfate 220 (50 Zn) MG capsule Take 220 mg by mouth at bedtime.    zolpidem (AMBIEN) 5 MG tablet TAKE ONE TABLET BY MOUTH AT BEDTIME.   zolpidem (AMBIEN) 5 MG tablet Take 1 tablet (5 mg total) by mouth at bedtime.   Immunization History  Administered Date(s) Administered   Influenza, Seasonal, Injecte, Preservative Fre 02/04/2005   Influenza,inj,Quad PF,6+ Mos 04/14/2015, 03/23/2017, 02/07/2018, 02/01/2019, 02/07/2020   Influenza-Unspecified 02/02/2019   PFIZER(Purple Top)SARS-COV-2 Vaccination 03/18/2020, 04/08/2020, 06/03/2020   Pneumococcal Conjugate-13 04/22/2014   Pneumococcal Polysaccharide-23 03/23/2017, 04/16/2019        Objective:   Physical Exam BP 114/60 (BP Location: Left Arm, Patient Position:  Sitting, Cuff Size: Normal)   Pulse 71   Temp 98.1 F (36.7 C) (Oral)   Ht 5\' 6"  (1.676 m)   Wt (!) 308 lb 6.4 oz (139.9 kg)   LMP 04/20/2001   SpO2 95%   BMI 49.78 kg/m  GENERAL: Morbidly obese woman, presents in transport chair.  No respiratory distress.  Comfortable with nasal cannula O2 oxygen on.  No conversational dyspnea. HEAD: Normocephalic, atraumatic. EYES: Pupils equal, round, reactive to light.  No scleral icterus. MOUTH: Nose/mouth/throat not examined due to masking requirements for COVID 19. NECK: Supple. No thyromegaly. Trachea midline. No JVD.  No adenopathy. PULMONARY: Good air entry bilaterally.  No adventitious sounds. CARDIOVASCULAR: S1 and S2. Regular rate and rhythm.  No rubs, murmurs or gallops heard. ABDOMEN: Obese, soft, nondistended. MUSCULOSKELETAL: RA changes both hands.  No clubbing or  edema noted, no increased warmth in the lower extremities.  No dressings in the lower extremities.  Chronic stasis changes but no overt edema. NEUROLOGIC: No overt focal deficit, speech is fluent. SKIN: Intact,warm,dry.  Chronic stasis changes lower extremities.   PSYCH: Very interactive today.  Mood appropriate.  No psychomotor retardation.     Assessment & Plan:     ICD-10-CM   1. Pulmonary fibrosis, postinflammatory (HCC)  J84.10 Pulse oximetry, overnight    AMB REFERRAL FOR DME   Clinically appears to be stable No increased dyspnea over baseline, no increased oxygen requirement    2. Chronic respiratory failure with hypoxia (HCC)  J96.11    We will decrease oxygen to 2-1/2 L/min Repeat overnight oximetry on 2-1/2 L/min AYR or NeilMed nasal gel to prevent nasal passage dryness    3. Chronic bronchitis, unspecified chronic bronchitis type (HCC)  J42    Discontinue DuoNeb Trial of Stiolto Albuterol for as needed use only    4. Rheumatoid arthritis  M06.9    This issue adds complexity to her management Followed by rheumatology currently on Plaquenil    5.  Shortness of breath  R06.02    Markedly improved Increasing activity day to day     Orders Placed This Encounter  Procedures   AMB REFERRAL FOR DME    Referral Priority:   Routine    Referral Type:   Durable Medical Equipment Purchase    Number of Visits Requested:   1   Pulse oximetry, overnight    On 2.5L EGB:TDVVO    Standing Status:   Future    Standing Expiration Date:   01/19/2022   Meds ordered this encounter  Medications   Tiotropium Bromide-Olodaterol (STIOLTO RESPIMAT) 2.5-2.5 MCG/ACT AERS    Sig: Inhale 2 puffs into the lungs daily.    Dispense:  4 g    Refill:  0    Order Specific Question:   Lot Number?    Answer:   [id number]    Order Specific Question:   Expiration Date?    Answer:   11/17/2021    Order Specific Question:   Quantity    Answer:   2   albuterol (PROVENTIL) (2.5 MG/3ML) 0.083% nebulizer solution    Sig: Take 3 mLs (2.5 mg total) by nebulization every 6 (six) hours as needed for wheezing or shortness of breath.    Dispense:  75 mL    Refill:  6   We made medication changes for the patient's her request.  She found DuoNeb to cumbersome to use during the day as it needs to be dosed every 6 hours.  We will give her a trial of Stiolto 2 inhalations daily and switch nebulization solutions to albuterol for as needed use only.  We will decrease her nighttime oxygen to 2-1/2 L/min and will repeat overnight oximetry on this.  To decrease trauma to her nasal mucosa.  She will need repeat CT scan of the chest in the future to follow-up on her pulmonary fibrosis.   We will see her in follow-up in 3 months time she is to call sooner should any new difficulties arise.  Gailen Shelter, MD Advanced Bronchoscopy PCCM [facility]    *This note was dictated using voice recognition software/Dragon.  Despite best efforts to proofread, errors can occur which can change the meaning.  Any change was purely unintentional.

## 2021-01-20 ENCOUNTER — Telehealth (INDEPENDENT_AMBULATORY_CARE_PROVIDER_SITE_OTHER): Payer: No Typology Code available for payment source | Admitting: Psychiatry

## 2021-01-20 ENCOUNTER — Encounter: Payer: Self-pay | Admitting: Pulmonary Disease

## 2021-01-20 DIAGNOSIS — F410 Panic disorder [episodic paroxysmal anxiety] without agoraphobia: Secondary | ICD-10-CM | POA: Diagnosis not present

## 2021-01-20 DIAGNOSIS — F331 Major depressive disorder, recurrent, moderate: Secondary | ICD-10-CM | POA: Diagnosis not present

## 2021-01-20 DIAGNOSIS — F411 Generalized anxiety disorder: Secondary | ICD-10-CM

## 2021-01-20 MED ORDER — ZOLPIDEM TARTRATE 5 MG PO TABS: 5.0000 mg | ORAL_TABLET | Freq: Every day | ORAL | 5 refills | Status: DC

## 2021-01-20 MED ORDER — QUETIAPINE FUMARATE 300 MG PO TABS: 300.0000 mg | ORAL_TABLET | Freq: Every day | ORAL | 5 refills | Status: DC

## 2021-01-20 MED ORDER — FLUOXETINE HCL 40 MG PO CAPS: 40.0000 mg | ORAL_CAPSULE | Freq: Every day | ORAL | 5 refills | Status: DC

## 2021-01-20 MED ORDER — BUSPIRONE HCL 10 MG PO TABS: 10.0000 mg | ORAL_TABLET | Freq: Two times a day (BID) | ORAL | 5 refills | Status: DC

## 2021-01-20 MED ORDER — ARIPIPRAZOLE 5 MG PO TABS: 5.0000 mg | ORAL_TABLET | Freq: Every day | ORAL | 5 refills | Status: DC

## 2021-01-20 NOTE — Progress Notes (Signed)
Virtual Visit via Telephone Note  I connected with [name] [name] on 01/20/21 at  1:00 PM EDT by telephone and verified that I am speaking with the correct person using two identifiers.  Location: Patient: Home Provider: Hospital   I discussed the limitations, risks, security and privacy concerns of performing an evaluation and management service by telephone and the availability of in person appointments. I also discussed with the patient that there may be a patient responsible charge related to this service. The patient expressed understanding and agreed to proceed.   History of Present Illness: Patient reached by telephone.  Identities established.  60 year old woman with a history of chronic depression and anxiety and multiple medical problems.  Patient states that over the last few months she has been having more frequent episodes she characterizes as panic attacks.  The main component to these is a feeling of being unable to catch her breath.  They will often last for hours at a time.  She saw her pulmonologist yesterday who did change some of her medicine but she is not sure yet whether it is going to make a difference.  Otherwise mood is reported as good.  She is not having any episodes of depression.  No suicidal ideation.  Sleeping adequately.    Observations/Objective: Alert and oriented and appropriate.  Lucid sounding.  No evidence of thought disorder.  Denies any suicidal thoughts.  Patient is complaining of more panic symptoms recently and overall anxiety mostly related to her medical complaints   Assessment and Plan: I suggested to her that we try increasing dose of fluoxetine to 40 mg for anxiety attacks.  Patient agreeable.  Prescription updated and everything else ordered and we can follow up in 6 months.   Follow Up Instructions:    I discussed the assessment and treatment plan with the patient. The patient was provided an opportunity to ask questions and all were  answered. The patient agreed with the plan and demonstrated an understanding of the instructions.   The patient was advised to call back or seek an in-person evaluation if the symptoms worsen or if the condition fails to improve as anticipated.  I provided 20 minutes of non-face-to-face time during this encounter.   Mordecai Rasmussen, MD

## 2021-01-22 ENCOUNTER — Ambulatory Visit
Admit: 2021-01-22 | Discharge: 2021-01-23 | Payer: MEDICAID | Attending: Student in an Organized Health Care Education/Training Program | Primary: Student in an Organized Health Care Education/Training Program

## 2021-01-22 DIAGNOSIS — R258 Other abnormal involuntary movements: Principal | ICD-10-CM

## 2021-01-29 ENCOUNTER — Ambulatory Visit
Payer: Medicaid Other | Attending: Student in an Organized Health Care Education/Training Program | Admitting: Student in an Organized Health Care Education/Training Program

## 2021-01-29 ENCOUNTER — Encounter: Payer: Self-pay | Admitting: Student in an Organized Health Care Education/Training Program

## 2021-01-29 ENCOUNTER — Other Ambulatory Visit: Payer: Self-pay

## 2021-01-29 ENCOUNTER — Telehealth: Payer: Self-pay

## 2021-01-29 VITALS — BP 92/47 | HR 77 | Temp 97.1°F | Resp 16 | Ht 66.0 in | Wt 305.0 lb

## 2021-01-29 DIAGNOSIS — G8929 Other chronic pain: Secondary | ICD-10-CM | POA: Diagnosis present

## 2021-01-29 DIAGNOSIS — G894 Chronic pain syndrome: Secondary | ICD-10-CM | POA: Diagnosis present

## 2021-01-29 DIAGNOSIS — M25529 Pain in unspecified elbow: Secondary | ICD-10-CM | POA: Diagnosis not present

## 2021-01-29 DIAGNOSIS — M05712 Rheumatoid arthritis with rheumatoid factor of left shoulder without organ or systems involvement: Secondary | ICD-10-CM | POA: Diagnosis present

## 2021-01-29 DIAGNOSIS — M792 Neuralgia and neuritis, unspecified: Secondary | ICD-10-CM

## 2021-01-29 DIAGNOSIS — M05711 Rheumatoid arthritis with rheumatoid factor of right shoulder without organ or systems involvement: Secondary | ICD-10-CM | POA: Diagnosis present

## 2021-01-29 DIAGNOSIS — M79673 Pain in unspecified foot: Secondary | ICD-10-CM | POA: Insufficient documentation

## 2021-01-29 DIAGNOSIS — M25531 Pain in right wrist: Secondary | ICD-10-CM | POA: Insufficient documentation

## 2021-01-29 DIAGNOSIS — E1142 Type 2 diabetes mellitus with diabetic polyneuropathy: Secondary | ICD-10-CM | POA: Diagnosis not present

## 2021-01-29 DIAGNOSIS — J9611 Chronic respiratory failure with hypoxia: Secondary | ICD-10-CM

## 2021-01-29 DIAGNOSIS — M25532 Pain in left wrist: Secondary | ICD-10-CM | POA: Insufficient documentation

## 2021-01-29 MED ORDER — OXYCODONE-ACETAMINOPHEN 5-325 MG PO TABS: 1.0000 | ORAL_TABLET | Freq: Three times a day (TID) | ORAL | 0 refills | Status: AC | PRN

## 2021-01-29 NOTE — Patient Instructions (Signed)
STOP TAKING XANAX, not good for your breathing or in combination with Oxycodone We will decrease your Oxycodone to every 8 hours

## 2021-01-29 NOTE — Progress Notes (Signed)
Nursing Pain Medication Assessment:  Safety precautions to be maintained throughout the outpatient stay will include: orient to surroundings, keep bed in low position, maintain call bell within reach at all times, provide assistance with transfer out of bed and ambulation.  Medication Inspection Compliance: Pill count conducted under aseptic conditions, in front of the patient. Neither the pills nor the bottle was removed from the patient's sight at any time. Once count was completed pills were immediately returned to the patient in their original bottle.  Medication: Oxycodone/APAP Pill/Patch Count:  33 of 120 pills remain Pill/Patch Appearance: Markings consistent with prescribed medication Bottle Appearance: Standard pharmacy container. Clearly labeled. Filled Date: 09 / 20 / 2022 Last Medication intake:  Today

## 2021-01-29 NOTE — Progress Notes (Signed)
PROVIDER NOTE: Information contained herein reflects review and annotations entered in association with encounter. Interpretation of such information and data should be left to medically-trained personnel. Information provided to patient can be located elsewhere in the medical record under "Patient Instructions". Document created using STT-dictation technology, any transcriptional errors that may result from process are unintentional.    Patient: [name] [name]  Service Category: E/M  Provider: Gillis Santa, MD  DOB: 1960/12/10  DOS: 01/29/2021  Specialty: Interventional Pain Management  MRN: [id number]  Setting: Ambulatory outpatient  PCP: [facility]  Type: Established Patient    Referring Provider: [facility]  Location: Office  Delivery: Face-to-face     HPI  [name] [name], a 60 y.o. year old female, is here today because of her Diabetic peripheral neuropathy ([place]) [E11.42]. [name] primary complain today is low back and bilateral leg pain Last encounter: My last encounter with her was on 12/04/20 Pertinent problems: [name] [name] has Chronic wrist pain (Secondary area of Pain) (Bilateral) (L>R); Chronic knee pain (Bilateral) (R>L); Long term current use of opiate analgesic; Opiate use (30 MME/Day); Neurogenic pain; Neuropathic pain; Diabetic peripheral neuropathy ([place]); Osteoarthritis, multiple sites; Chronic foot pain (Primary Area of Pain) (Bilateral) (L>R); Chronic elbow pain (Third area of Pain) (Bilateral) (L>R); Chronic upper back pain; Chronic pain syndrome; Rheumatoid arthritis involving multiple sites with positive rheumatoid factor ([place]); CKD (chronic kidney disease), stage III ([place]); Morbid obesity with BMI of 50.0-59.9, adult ([place]); and Severe sepsis with septic shock ([place]) on their pertinent problem list. Pain Assessment: Severity of Chronic pain is reported as a 3 /10. Location: Other (Comment) (right hand, right wrist, right knee)  /denies. Onset:  . Quality:  Aching, Constant, Sharp. Timing: Constant. Modifying factor(s): meds, heat. Vitals:  height is 5' 6"  (1.676 m) and weight is 305 lb (138.3 kg) (abnormal). Her temporal temperature is 97.1 F (36.2 C) (abnormal). Her blood pressure is 92/47 (abnormal) and her pulse is 77. Her respiration is 16 and oxygen saturation is 92%.   Reason for encounter: medication management.    Patient presents today for medication management.  Discussed weaning her medications to every 8 hours as needed.  Discussed the risk of chronic opioid therapy.  Recommend lowest effective dose possible.  Discussed phenomenon of opioid-induced hyperalgesia as well as long-term side effects with chronic opioid therapy including immunosuppression, cognitive dysfunction, constipation, nausea.   Pharmacotherapy Assessment  Analgesic: Percocet 5 mg, weaning to TID PRN    Monitoring: [name] [name]: PDMP reviewed during this encounter.       Pharmacotherapy: No side-effects or adverse reactions reported. Compliance: No problems identified. Effectiveness: Clinically acceptable.  UDS:  Summary  Date Value Ref Range Status  09/16/2020 Note  Final    Comment:    ==================================================================== [employer] Select 13 (MW) ==================================================================== Test                             Result       Flag       Units  Drug Present and Declared for Prescription Verification   Oxycodone                      615          EXPECTED   ng/mg creat   Noroxycodone                   587  EXPECTED   ng/mg creat    Sources of oxycodone include scheduled prescription medications.    Noroxycodone is an expected metabolite of oxycodone.  Drug Absent but Declared for Prescription Verification   Alprazolam                     Not Detected UNEXPECTED ng/mg creat ==================================================================== Test                      Result    Flag    Units      Ref Range   Creatinine              139              mg/dL      >=20 ==================================================================== Declared Medications:  The flagging and interpretation on this report are based on the  following declared medications.  Unexpected results may arise from  inaccuracies in the declared medications.   **Note: The testing scope of this panel includes these medications:   Alprazolam (Xanax)  Oxycodone (Percocet)   **Note: The testing scope of this panel does not include the  following reported medications:   Acetaminophen (Tylenol)  Acetaminophen (Percocet)  Albuterol (Duoneb)  Apixaban (Eliquis)  Aripiprazole (Abilify)  Atorvastatin  Budesonide (Pulmicort)  Calcium  Cholecalciferol  Fluoxetine (Prozac)  Folic Acid  Furosemide  Insulin (Humalog)  Ipratropium (Duoneb)  Levothyroxine  Linagliptin (Tradjenta)  Metformin  Mupirocin  Polyethylene Glycol (MiraLAX)  Pramipexole (Mirapex)  Pregabalin (Lyrica)  Quetiapine (Seroquel)  Solifenacin (Vesicare)  Vancomycin  Vitamin C  Vitamin D  Zinc  Zolpidem (Ambien) ==================================================================== For clinical consultation, please call [phone]. ====================================================================       ROS  Constitutional:  Intermittent shortness of breath Gastrointestinal: No reported hemesis, hematochezia, vomiting, or acute GI distress Musculoskeletal:  Low back pain, bilateral leg pain Neurological: No reported episodes of acute onset apraxia, aphasia, dysarthria, agnosia, amnesia, paralysis, loss of coordination, or loss of consciousness  Medication Review  ALPRAZolam, ARIPiprazole, FLUoxetine, QUEtiapine, Tiotropium Bromide-Olodaterol, Vitamin D, acetaminophen, albuterol, apixaban, ascorbic acid, busPIRone, calcium citrate-vitamin D, famotidine, folic acid, furosemide, hydroxychloroquine, insulin lispro,  levothyroxine, linagliptin, metFORMIN, methotrexate, metoprolol tartrate, mupirocin ointment, oxyCODONE-acetaminophen, polyethylene glycol, pramipexole, pregabalin, solifenacin, zinc sulfate, and zolpidem  History Review  Allergy: [name] is allergic to cephalexin, codeine, doxycycline, propoxyphene, sulfa antibiotics, clindamycin, lovenox [enoxaparin sodium], hydrocodone, and meropenem. Drug: [name]  reports no history of drug use. Alcohol:  reports no history of alcohol use. Tobacco:  reports that she quit smoking about 21 years ago. Her smoking use included cigarettes. She has a 54.00 pack-year smoking history. She has never used smokeless tobacco. Social: [name]  reports that she quit smoking about 21 years ago. Her smoking use included cigarettes. She has a 54.00 pack-year smoking history. She has never used smokeless tobacco. She reports that she does not drink alcohol and does not use drugs. Medical:  has a past medical history of Abdominal wall hernia (01/29/2013), Anxiety, Arthritis, C. difficile colitis, Chronic diastolic heart failure ([place]), COVID-19 (03/23/2019), Depression, Diabetes mellitus, Diastolic CHF ([place]), Esophagitis, Fluid retention, GERD (gastroesophageal reflux disease), Hiatal hernia, Hypertension, Hypokalemia due to loss of potassium (10/21/2015), Hypothyroidism, IBS (irritable bowel syndrome), Moderate episode of recurrent major depressive disorder ([place]) (06/03/2004), Morbid obesity ([place]), MRSA (methicillin resistant Staphylococcus aureus) infection (11/2017), Neurogenic bladder, Neuropathy, Obesity, Panic attacks, Pneumonia due to COVID-19 virus, Rheumatoid arthritis ([place]), and Sleep apnea. Surgical: [name]  has a past surgical history  that includes Tubal ligation; Tonsillectomy; Cholecystectomy; Abdominal hysterectomy; Laparoscopic gastric banding (03/20/07); Eye surgery; Hernia repair; DG GREAT TOE RIGHT FOOT (02/23/2018); TEE without cardioversion (N/A, 07/16/2019); and  Irrigation and debridement foot (Left, 01/11/2020). Family: family history includes Alcohol abuse in her father and sister; Anxiety disorder in her father, sister, and sister; Bipolar disorder in her father and sister; Depression in her father, sister, and sister; Drug abuse in her sister; Heart attack in her brother; Heart attack (age of onset: 58) in her brother; Heart disease in her brother; Heart failure in her father.  Laboratory Chemistry Profile   Renal Lab Results  Component Value Date   BUN 15 07/29/2020   CREATININE 0.83 07/29/2020   BCR 16 11/16/2018   GFRAA >60 01/15/2020   GFRNONAA >60 07/29/2020     Hepatic Lab Results  Component Value Date   AST 32 04/28/2020   ALT 31 04/28/2020   ALBUMIN 3.3 (L) 05/21/2020   ALKPHOS 70 04/28/2020   LIPASE 176 (H) 07/13/2019     Electrolytes Lab Results  Component Value Date   NA 138 07/29/2020   K 3.7 07/29/2020   CL 100 07/29/2020   CALCIUM 9.2 07/29/2020   MG 1.7 01/15/2020   PHOS 2.8 05/21/2020     Bone Lab Results  Component Value Date   VD25OH 37.36 04/30/2019   25OHVITD1 23 (L) 11/16/2018   25OHVITD2 2.2 11/16/2018   25OHVITD3 21 11/16/2018     Inflammation (CRP: Acute Phase) (ESR: Chronic Phase) Lab Results  Component Value Date   CRP 2.1 (H) 07/17/2019   ESRSEDRATE 65 (H) 07/17/2019   LATICACIDVEN 1.4 04/28/2020       Note: Above Lab results reviewed.  Physical Exam  General appearance: Well nourished, well developed, and well hydrated. 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   Weight as of this encounter: 305 lb (138.3 kg). Ideal: Ideal body weight: 59.3 kg (130 lb 11.7 oz) Adjusted ideal body weight: 90.9 kg (200 lb 7 oz)   Patient comes today in a wheelchair.  Morbidly obese.  Lower extremity edema, pitting Low back pain, bilateral hip pain, bilateral knee pain Neuropathic pain of bilateral lower extremity Musculoskeletal pain overlying various joints most pronounced in her elbow, wrist, hips, low back  Assessment   Diagnosis  1. Diabetic peripheral neuropathy ([place])   2. Neurogenic pain   3. Chronic elbow pain (Third area of Pain) (Bilateral) (L>R)   4. Neuropathic pain   5. Chronic foot pain (Primary Area of Pain) (Bilateral) (L>R)   6. Rheumatoid arthritis involving both shoulders with positive rheumatoid factor (H[facility])   7. Chronic wrist pain (Secondary area of Pain) (Bilateral) (L>R)   8. Chronic pain syndrome       Plan of Care  [name] has a current medication list which includes the following long-term medication(s): albuterol, aripiprazole,  calcium citrate-vitamin d, eliquis, famotidine, fluoxetine, furosemide, insulin lispro, pregabalin, quetiapine, quetiapine, zolpidem, zolpidem, and fluoxetine.  Pharmacotherapy (Medications Ordered): Meds ordered this encounter  Medications   oxyCODONE-acetaminophen (PERCOCET) 5-325 MG tablet    Sig: Take 1 tablet by mouth every 8 (eight) hours as needed for severe pain. Must last 30 days.    Dispense:  90 tablet    Refill:  0    Chronic Pain. (STOP Act - Not applicable). Fill one day early if closed on scheduled refill date.   oxyCODONE-acetaminophen (PERCOCET) 5-325 MG tablet    Sig: Take 1 tablet by mouth every 8 (eight) hours as needed for severe pain. Must last 30 days.    Dispense:  90 tablet    Refill:  0    Chronic Pain. (STOP Act - Not applicable). Fill one day early if closed on scheduled refill date.    Continue weaning at next visit.  Hopefully down to twice daily as needed I have also discussed Qutenza capsaicin treatment for painful diabetic neuropathy however patient has Medicaid and they will not cover it.  Follow-up plan:   Return in about 9 weeks (around 04/02/2021) for Medication Management, in person.   Recent Visits Date Type Provider Dept  12/04/20 Office Visit Gillis Santa, MD [facility]  Showing recent visits within past 90 days and meeting all other requirements Today's Visits Date Type Provider Dept  01/29/21 Office Visit Gillis Santa, MD [facility]  Showing today's visits and meeting all other requirements Future Appointments Date Type Provider Dept  04/02/21 Appointment Gillis Santa, MD [facility]  Showing future appointments within next 90 days and meeting all other requirements I discussed the assessment and treatment plan with the patient. The patient was provided an opportunity to ask questions and all were answered. The patient agreed with the plan and demonstrated an understanding of the instructions.  Patient advised to call back or seek an in-person evaluation if the symptoms or condition worsens.  Duration of encounter: 30 minutes.  Note by: Gillis Santa, MD Date: 01/29/2021; Time: 11:40 AM

## 2021-01-29 NOTE — Telephone Encounter (Signed)
Called and spoke to patient about ONO test results, patient had a clear understanding, Per Dr Jayme Cloud patient needs to have O2 at 3L night, nothing further needed.

## 2021-01-29 NOTE — Telephone Encounter (Signed)
Lowest O2 was 79%

## 2021-02-06 ENCOUNTER — Ambulatory Visit: Admit: 2021-02-06 | Discharge: 2021-02-08 | Payer: MEDICAID

## 2021-02-07 MED ORDER — PRAMIPEXOLE 0.125 MG TABLET
ORAL_TABLET | 0 refills | 0 days
Start: 2021-02-07 — End: ?

## 2021-02-08 MED ORDER — PROCHLORPERAZINE MALEATE 5 MG TABLET
ORAL_TABLET | Freq: Four times a day (QID) | ORAL | 0 refills | 7 days | Status: CP | PRN
Start: 2021-02-08 — End: 2021-02-15

## 2021-02-08 MED ORDER — VANCOMYCIN 125 MG CAPSULE
ORAL_CAPSULE | Freq: Four times a day (QID) | ORAL | 0 refills | 10 days | Status: CP
Start: 2021-02-08 — End: 2021-02-18

## 2021-02-09 MED ORDER — PRAMIPEXOLE 0.125 MG TABLET
ORAL_TABLET | Freq: Every evening | ORAL | 0 refills | 90.00000 days | Status: CP
Start: 2021-02-09 — End: 2022-02-09

## 2021-02-25 ENCOUNTER — Ambulatory Visit
Admit: 2021-02-25 | Discharge: 2021-02-26 | Payer: PRIVATE HEALTH INSURANCE | Attending: Emergency Medicine | Primary: Emergency Medicine

## 2021-02-25 DIAGNOSIS — L089 Local infection of the skin and subcutaneous tissue, unspecified: Principal | ICD-10-CM

## 2021-02-25 MED ORDER — AMOXICILLIN 875 MG-POTASSIUM CLAVULANATE 125 MG TABLET
ORAL_TABLET | Freq: Two times a day (BID) | ORAL | 0 refills | 10 days | Status: CP
Start: 2021-02-25 — End: 2021-03-07

## 2021-02-25 MED ORDER — MUPIROCIN 2 % TOPICAL OINTMENT
Freq: Three times a day (TID) | TOPICAL | 0 refills | 7.00000 days | Status: CP
Start: 2021-02-25 — End: 2021-03-04

## 2021-02-26 DIAGNOSIS — Z794 Long term (current) use of insulin: Principal | ICD-10-CM

## 2021-02-26 DIAGNOSIS — E1169 Type 2 diabetes mellitus with other specified complication: Principal | ICD-10-CM

## 2021-02-26 MED ORDER — DEXCOM G6 TRANSMITTER DEVICE
3 refills | 0 days | Status: CP
Start: 2021-02-26 — End: ?

## 2021-02-27 ENCOUNTER — Ambulatory Visit
Admit: 2021-02-27 | Discharge: 2021-02-27 | Disposition: A | Discharge status: Home / Self Care | Payer: PRIVATE HEALTH INSURANCE | Attending: Family

## 2021-02-27 DIAGNOSIS — L089 Local infection of the skin and subcutaneous tissue, unspecified: Principal | ICD-10-CM

## 2021-03-05 ENCOUNTER — Ambulatory Visit: Admit: 2021-03-05 | Discharge: 2021-03-06 | Payer: MEDICAID | Attending: Family | Primary: Family

## 2021-03-05 DIAGNOSIS — G2581 Restless legs syndrome: Principal | ICD-10-CM

## 2021-03-05 DIAGNOSIS — R451 Restlessness and agitation: Principal | ICD-10-CM

## 2021-03-05 DIAGNOSIS — E039 Hypothyroidism, unspecified: Principal | ICD-10-CM

## 2021-03-05 DIAGNOSIS — Z794 Long term (current) use of insulin: Principal | ICD-10-CM

## 2021-03-05 DIAGNOSIS — Z09 Encounter for follow-up examination after completed treatment for conditions other than malignant neoplasm: Principal | ICD-10-CM

## 2021-03-05 DIAGNOSIS — A0472 Enterocolitis due to Clostridium difficile, not specified as recurrent: Principal | ICD-10-CM

## 2021-03-05 DIAGNOSIS — Z1231 Encounter for screening mammogram for malignant neoplasm of breast: Principal | ICD-10-CM

## 2021-03-05 DIAGNOSIS — E1165 Type 2 diabetes mellitus with hyperglycemia: Principal | ICD-10-CM

## 2021-03-05 DIAGNOSIS — Z1211 Encounter for screening for malignant neoplasm of colon: Principal | ICD-10-CM

## 2021-03-05 DIAGNOSIS — J449 Chronic obstructive pulmonary disease, unspecified: Principal | ICD-10-CM

## 2021-03-05 DIAGNOSIS — A09 Infectious gastroenteritis and colitis, unspecified: Principal | ICD-10-CM

## 2021-03-05 DIAGNOSIS — R231 Pallor: Principal | ICD-10-CM

## 2021-03-05 DIAGNOSIS — K567 Ileus, unspecified: Principal | ICD-10-CM

## 2021-03-05 DIAGNOSIS — L089 Local infection of the skin and subcutaneous tissue, unspecified: Principal | ICD-10-CM

## 2021-03-05 DIAGNOSIS — I5032 Chronic diastolic (congestive) heart failure: Principal | ICD-10-CM

## 2021-03-05 DIAGNOSIS — D649 Anemia, unspecified: Principal | ICD-10-CM

## 2021-03-05 MED ORDER — VANCOMYCIN 125 MG CAPSULE
ORAL_CAPSULE | Freq: Four times a day (QID) | ORAL | 0 refills | 10 days | Status: CP
Start: 2021-03-05 — End: 2021-03-15

## 2021-03-06 ENCOUNTER — Other Ambulatory Visit: Admit: 2021-03-06 | Discharge: 2021-03-07 | Payer: MEDICAID

## 2021-03-06 DIAGNOSIS — Z794 Long term (current) use of insulin: Principal | ICD-10-CM

## 2021-03-06 DIAGNOSIS — E1165 Type 2 diabetes mellitus with hyperglycemia: Principal | ICD-10-CM

## 2021-03-06 MED ORDER — PROCHLORPERAZINE MALEATE 5 MG TABLET
ORAL_TABLET | Freq: Four times a day (QID) | ORAL | 0 refills | 7.00000 days | Status: CP | PRN
Start: 2021-03-06 — End: 2021-03-13

## 2021-03-19 ENCOUNTER — Ambulatory Visit: Admit: 2021-03-19 | Discharge: 2021-03-20 | Payer: MEDICAID

## 2021-03-19 DIAGNOSIS — L97412 Non-pressure chronic ulcer of right heel and midfoot with fat layer exposed: Principal | ICD-10-CM

## 2021-03-19 DIAGNOSIS — Z9889 Other specified postprocedural states: Principal | ICD-10-CM

## 2021-03-19 DIAGNOSIS — L97522 Non-pressure chronic ulcer of other part of left foot with fat layer exposed: Principal | ICD-10-CM

## 2021-03-19 DIAGNOSIS — Z89419 Acquired absence of unspecified great toe: Principal | ICD-10-CM

## 2021-03-19 DIAGNOSIS — E11621 Type 2 diabetes mellitus with foot ulcer: Principal | ICD-10-CM

## 2021-03-30 ENCOUNTER — Telehealth: Payer: Self-pay | Admitting: Pulmonary Disease

## 2021-03-30 NOTE — Telephone Encounter (Signed)
Spoke to patient, who stated that she is due for oxygen re certification. According for our records, patient was re certified on 01/19/2021.  Lm for [name] with [employer].

## 2021-03-31 ENCOUNTER — Ambulatory Visit: Admit: 2021-03-31 | Discharge: 2021-04-01 | Payer: PRIVATE HEALTH INSURANCE

## 2021-03-31 DIAGNOSIS — Z89419 Acquired absence of unspecified great toe: Principal | ICD-10-CM

## 2021-03-31 DIAGNOSIS — B351 Tinea unguium: Principal | ICD-10-CM

## 2021-03-31 DIAGNOSIS — M216X9 Other acquired deformities of unspecified foot: Principal | ICD-10-CM

## 2021-03-31 DIAGNOSIS — E11621 Type 2 diabetes mellitus with foot ulcer: Principal | ICD-10-CM

## 2021-03-31 DIAGNOSIS — L97511 Non-pressure chronic ulcer of other part of right foot limited to breakdown of skin: Principal | ICD-10-CM

## 2021-03-31 DIAGNOSIS — M2042 Other hammer toe(s) (acquired), left foot: Principal | ICD-10-CM

## 2021-03-31 DIAGNOSIS — L97522 Non-pressure chronic ulcer of other part of left foot with fat layer exposed: Principal | ICD-10-CM

## 2021-03-31 DIAGNOSIS — Z9889 Other specified postprocedural states: Principal | ICD-10-CM

## 2021-03-31 NOTE — Telephone Encounter (Signed)
Spoke to [name] [name] with [employer] and replayed below message.  [name] stated that she would pull chart notes and walk test from 01/21/2021. Patient is aware and voiced her understanding.  Nothing further needed at this time.

## 2021-04-02 ENCOUNTER — Encounter: Payer: Medicaid Other | Admitting: Student in an Organized Health Care Education/Training Program

## 2021-04-02 ENCOUNTER — Other Ambulatory Visit: Payer: Self-pay | Admitting: Pulmonary Disease

## 2021-04-02 MED ORDER — LEVOTHYROXINE 88 MCG TABLET
ORAL_TABLET | Freq: Every day | ORAL | 3 refills | 90 days | Status: CP
Start: 2021-04-02 — End: 2022-04-02

## 2021-04-02 NOTE — Telephone Encounter (Signed)
Yes she needs to stay on the Eliquis.

## 2021-04-02 NOTE — Telephone Encounter (Signed)
Hi Dr.Gonzalez ,  Would you like to refill pt's Eliquis?

## 2021-04-03 ENCOUNTER — Ambulatory Visit: Admit: 2021-04-03 | Discharge: 2021-04-04 | Payer: PRIVATE HEALTH INSURANCE

## 2021-04-07 ENCOUNTER — Encounter: Payer: Medicaid Other | Admitting: Student in an Organized Health Care Education/Training Program

## 2021-04-18 DIAGNOSIS — L97423 Non-pressure chronic ulcer of left heel and midfoot with necrosis of muscle: Principal | ICD-10-CM

## 2021-04-18 DIAGNOSIS — E11621 Type 2 diabetes mellitus with foot ulcer: Principal | ICD-10-CM

## 2021-04-23 DIAGNOSIS — E11621 Type 2 diabetes mellitus with foot ulcer: Principal | ICD-10-CM

## 2021-04-23 DIAGNOSIS — L97423 Non-pressure chronic ulcer of left heel and midfoot with necrosis of muscle: Principal | ICD-10-CM

## 2021-04-24 ENCOUNTER — Ambulatory Visit: Admit: 2021-04-24 | Discharge: 2021-04-25 | Payer: PRIVATE HEALTH INSURANCE

## 2021-04-24 DIAGNOSIS — A0472 Enterocolitis due to Clostridium difficile, not specified as recurrent: Principal | ICD-10-CM

## 2021-04-24 DIAGNOSIS — E1143 Type 2 diabetes mellitus with diabetic autonomic (poly)neuropathy: Principal | ICD-10-CM

## 2021-04-24 DIAGNOSIS — K3184 Gastroparesis: Principal | ICD-10-CM

## 2021-04-24 DIAGNOSIS — K567 Ileus, unspecified: Principal | ICD-10-CM

## 2021-04-27 ENCOUNTER — Institutional Professional Consult (permissible substitution): Admit: 2021-04-27 | Discharge: 2021-04-28 | Payer: PRIVATE HEALTH INSURANCE

## 2021-04-27 ENCOUNTER — Ambulatory Visit: Admit: 2021-04-27 | Discharge: 2021-04-28 | Payer: PRIVATE HEALTH INSURANCE

## 2021-05-01 ENCOUNTER — Institutional Professional Consult (permissible substitution): Admit: 2021-05-01 | Discharge: 2021-05-02 | Payer: PRIVATE HEALTH INSURANCE

## 2021-05-05 DIAGNOSIS — E782 Mixed hyperlipidemia: Principal | ICD-10-CM

## 2021-05-05 MED ORDER — AMLODIPINE 10 MG TABLET
ORAL_TABLET | Freq: Every day | ORAL | 1 refills | 90 days | Status: CP
Start: 2021-05-05 — End: 2022-05-05

## 2021-05-05 MED ORDER — TRADJENTA 5 MG TABLET
ORAL_TABLET | Freq: Every day | ORAL | 1 refills | 90 days | Status: CP
Start: 2021-05-05 — End: 2022-05-05

## 2021-05-05 MED ORDER — ATORVASTATIN 80 MG TABLET
ORAL_TABLET | Freq: Every evening | ORAL | 1 refills | 90 days | Status: CP
Start: 2021-05-05 — End: 2022-05-05

## 2021-05-05 MED ORDER — ZINC SULFATE 50 MG ZINC (220 MG) CAPSULE
ORAL_CAPSULE | Freq: Every day | ORAL | 1 refills | 90 days | Status: CP
Start: 2021-05-05 — End: 2022-05-05

## 2021-05-05 MED ORDER — PRAMIPEXOLE 0.125 MG TABLET
ORAL_TABLET | Freq: Every evening | ORAL | 1 refills | 90 days | Status: CP
Start: 2021-05-05 — End: 2022-05-05

## 2021-05-06 ENCOUNTER — Ambulatory Visit: Admit: 2021-05-06 | Discharge: 2021-05-07 | Payer: PRIVATE HEALTH INSURANCE

## 2021-05-07 ENCOUNTER — Ambulatory Visit
Payer: Medicaid Other | Attending: Student in an Organized Health Care Education/Training Program | Admitting: Student in an Organized Health Care Education/Training Program

## 2021-05-07 ENCOUNTER — Other Ambulatory Visit: Payer: Self-pay

## 2021-05-07 ENCOUNTER — Encounter: Payer: Self-pay | Admitting: Student in an Organized Health Care Education/Training Program

## 2021-05-07 VITALS — BP 120/62 | HR 70 | Temp 97.0°F | Resp 20 | Ht 66.0 in | Wt 314.0 lb

## 2021-05-07 DIAGNOSIS — G8929 Other chronic pain: Secondary | ICD-10-CM | POA: Diagnosis present

## 2021-05-07 DIAGNOSIS — E1142 Type 2 diabetes mellitus with diabetic polyneuropathy: Secondary | ICD-10-CM

## 2021-05-07 DIAGNOSIS — M25529 Pain in unspecified elbow: Secondary | ICD-10-CM

## 2021-05-07 DIAGNOSIS — M792 Neuralgia and neuritis, unspecified: Secondary | ICD-10-CM | POA: Diagnosis not present

## 2021-05-07 DIAGNOSIS — M79673 Pain in unspecified foot: Secondary | ICD-10-CM | POA: Diagnosis not present

## 2021-05-07 DIAGNOSIS — G894 Chronic pain syndrome: Secondary | ICD-10-CM | POA: Diagnosis present

## 2021-05-07 DIAGNOSIS — M05711 Rheumatoid arthritis with rheumatoid factor of right shoulder without organ or systems involvement: Secondary | ICD-10-CM

## 2021-05-07 DIAGNOSIS — M05712 Rheumatoid arthritis with rheumatoid factor of left shoulder without organ or systems involvement: Secondary | ICD-10-CM

## 2021-05-07 MED ORDER — OXYCODONE-ACETAMINOPHEN 5-325 MG PO TABS: 1.0000 | ORAL_TABLET | Freq: Three times a day (TID) | ORAL | 0 refills | Status: DC | PRN

## 2021-05-07 MED ORDER — OXYCODONE-ACETAMINOPHEN 5-325 MG PO TABS: 1.0000 | ORAL_TABLET | Freq: Three times a day (TID) | ORAL | 0 refills | Status: AC | PRN

## 2021-05-07 NOTE — Progress Notes (Signed)
PROVIDER NOTE: Information contained herein reflects review and annotations entered in association with encounter. Interpretation of such information and data should be left to medically-trained personnel. Information provided to patient can be located elsewhere in the medical record under "Patient Instructions". Document created using STT-dictation technology, any transcriptional errors that may result from process are unintentional.    Patient: [name] [name]  Service Category: E/M  Provider: Gillis Santa, MD  DOB: 09-03-1960  DOS: 05/07/2021  Specialty: Interventional Pain Management  MRN: [id number]  Setting: Ambulatory outpatient  PCP: Care, [place] [facility]  Type: Established Patient    Referring Provider: [facility], [place] [facility]  Location: Office  Delivery: Face-to-face     HPI  [name] [name], a 61 y.o. year old female, is here today because of her Diabetic peripheral neuropathy ([place]) [E11.42]. [name] [name] primary complain today is low back and bilateral leg pain Last encounter: My last encounter with her was on 01/29/21 Pertinent problems: [name] [name] has Chronic wrist pain (Secondary area of Pain) (Bilateral) (L>R); Chronic knee pain (Bilateral) (R>L); Long term current use of opiate analgesic; Opiate use (30 MME/Day); Neurogenic pain; Neuropathic pain; Diabetic peripheral neuropathy ([place]); Osteoarthritis, multiple sites; Chronic foot pain (Primary Area of Pain) (Bilateral) (L>R); Chronic elbow pain (Third area of Pain) (Bilateral) (L>R); Chronic upper back pain; Chronic pain syndrome; Rheumatoid arthritis involving multiple sites with positive rheumatoid factor ([place]); CKD (chronic kidney disease), stage III ([place]); Morbid obesity with BMI of 50.0-59.9, adult ([place]); and Severe sepsis with septic shock ([place]) on their pertinent problem list. Pain Assessment: Severity of Chronic pain is reported as a 8 /10. Location: Other (Comment) (joints)  /n/a. Onset: More than a month ago. Quality:  . Timing:  Constant. Modifying factor(s): medications. Vitals:  height is _0  (1.676 m) and weight is 314 lb (142.4 kg) (abnormal). Her temporal temperature is 97 F (36.1 C) (abnormal). Her blood pressure is 120/62 and her pulse is 70. Her respiration is 20 and oxygen saturation is 98%.   Reason for encounter: medication management.    Patient presents today for medication management.  At her last clinic visit, we reduced her oxycodone every 6 hours to every 8 hours.  Plan today was to wean further however patient has upcoming left ankle surgery.  For the time being we will keep her chronic pain medications as they are.  I provided her with a surgeons letter that details postoperative opioid prescribing recommendations to be made by surgical team.  She is to continue her chronic pain medications as prescribed.  Also encouraged her to discuss hyperbaric oxygen therapy for wound healing for her left ankle with her surgeon.  After she recovers from her surgery plan will be to wean off her opioids which I was clear with [name] [name] about.   Pharmacotherapy Assessment  Analgesic: Percocet 5 mg, weaning to TID PRN    Monitoring: [place] PMP: PDMP reviewed during this encounter.       Pharmacotherapy: No side-effects or adverse reactions reported. Compliance: No problems identified. Effectiveness: Clinically acceptable.  UDS:  Summary  Date Value Ref Range Status  09/16/2020 Note  Final    Comment:    ==================================================================== [employer] Select 13 (MW) ==================================================================== Test                             Result       Flag       Units  Drug Present and Declared for Prescription Verification  Oxycodone                      615          EXPECTED   ng/mg creat   Noroxycodone                   587          EXPECTED   ng/mg creat    Sources of oxycodone include scheduled prescription medications.    Noroxycodone is an expected  metabolite of oxycodone.  Drug Absent but Declared for Prescription Verification   Alprazolam                     Not Detected UNEXPECTED ng/mg creat ==================================================================== Test                      Result    Flag   Units      Ref Range   Creatinine              139              mg/dL      >=20 ==================================================================== Declared Medications:  The flagging and interpretation on this report are based on the  following declared medications.  Unexpected results may arise from  inaccuracies in the declared medications.   **Note: The testing scope of this panel includes these medications:   Alprazolam (Xanax)  Oxycodone (Percocet)   **Note: The testing scope of this panel does not include the  following reported medications:   Acetaminophen (Tylenol)  Acetaminophen (Percocet)  Albuterol (Duoneb)  Apixaban (Eliquis)  Aripiprazole (Abilify)  Atorvastatin  Budesonide (Pulmicort)  Calcium  Cholecalciferol  Fluoxetine (Prozac)  Folic Acid  Furosemide  Insulin (Humalog)  Ipratropium (Duoneb)  Levothyroxine  Linagliptin (Tradjenta)  Metformin  Mupirocin  Polyethylene Glycol (MiraLAX)  Pramipexole (Mirapex)  Pregabalin (Lyrica)  Quetiapine (Seroquel)  Solifenacin (Vesicare)  Vancomycin  Vitamin C  Vitamin D  Zinc  Zolpidem (Ambien) ==================================================================== For clinical consultation, please call [phone]. ====================================================================       ROS  Constitutional:  Intermittent shortness of breath Gastrointestinal: No reported hemesis, hematochezia, vomiting, or acute GI distress Musculoskeletal:  Low back pain, left ankle pain Neurological: No reported episodes of acute onset apraxia, aphasia, dysarthria, agnosia, amnesia, paralysis, loss of coordination, or loss of consciousness  Medication  Review  ALPRAZolam, ARIPiprazole, FLUoxetine, Insulin Degludec, QUEtiapine, Tiotropium Bromide-Olodaterol, Vitamin D, acetaminophen, albuterol, apixaban, ascorbic acid, busPIRone, calcium citrate-vitamin D, famotidine, folic acid, furosemide, hydroxychloroquine, levothyroxine, linagliptin, metFORMIN, methotrexate, metoprolol tartrate, mupirocin ointment, oxyCODONE-acetaminophen, polyethylene glycol, pramipexole, pregabalin, solifenacin, zinc sulfate, and zolpidem  History Review  Allergy: Ms. Graw is allergic to cephalexin, codeine, doxycycline, propoxyphene, sulfa antibiotics, clindamycin, lovenox [enoxaparin sodium], hydrocodone, and meropenem. Drug: Ms. Rauber  reports no history of drug use. Alcohol:  reports no history of alcohol use. Tobacco:  reports that she quit smoking about 21 years ago. Her smoking use included cigarettes. She has a 54.00 pack-year smoking history. She has never used smokeless tobacco. Social: Ms. Linskey  reports that she quit smoking about 21 years ago. Her smoking use included cigarettes. She has a 54.00 pack-year smoking history. She has never used smokeless tobacco. She reports that she does not drink alcohol and does not use drugs. Medical:  has a past medical history of Abdominal wall hernia (01/29/2013), Anxiety, Arthritis, C. difficile colitis, Chronic diastolic heart failure ([name]), COVID-19 (03/23/2019), Depression, Diabetes mellitus, Diastolic CHF (  [place]), Esophagitis, Fluid retention, GERD (gastroesophageal reflux disease), Hiatal hernia, Hypertension, Hypokalemia due to loss of potassium (10/21/2015), Hypothyroidism, IBS (irritable bowel syndrome), Moderate episode of recurrent major depressive disorder ([place]) (06/03/2004), Morbid obesity ([place]), MRSA (methicillin resistant Staphylococcus aureus) infection (11/2017), Neurogenic bladder, Neuropathy, Obesity, Panic attacks, Pneumonia due to COVID-19 virus, Rheumatoid arthritis ([place]), and Sleep apnea. Surgical: Ms. Aranas  has  a past surgical history that includes Tubal ligation; Tonsillectomy; Cholecystectomy; Abdominal hysterectomy; Laparoscopic gastric banding (03/20/07); Eye surgery; Hernia repair; DG GREAT TOE RIGHT FOOT (02/23/2018); TEE without cardioversion (N/A, 07/16/2019); and Irrigation and debridement foot (Left, 01/11/2020). Family: family history includes Alcohol abuse in her father and sister; Anxiety disorder in her father, sister, and sister; Bipolar disorder in her father and sister; Depression in her father, sister, and sister; Drug abuse in her sister; Heart attack in her brother; Heart attack (age of onset: 72) in her brother; Heart disease in her brother; Heart failure in her father.  Laboratory Chemistry Profile   Renal Lab Results  Component Value Date   BUN 15 07/29/2020   CREATININE 0.83 07/29/2020   BCR 16 11/16/2018   GFRAA >60 01/15/2020   GFRNONAA >60 07/29/2020     Hepatic Lab Results  Component Value Date   AST 32 04/28/2020   ALT 31 04/28/2020   ALBUMIN 3.3 (L) 05/21/2020   ALKPHOS 70 04/28/2020   LIPASE 176 (H) 07/13/2019     Electrolytes Lab Results  Component Value Date   NA 138 07/29/2020   K 3.7 07/29/2020   CL 100 07/29/2020   CALCIUM 9.2 07/29/2020   MG 1.7 01/15/2020   PHOS 2.8 05/21/2020     Bone Lab Results  Component Value Date   VD25OH 37.36 04/30/2019   25OHVITD1 23 (L) 11/16/2018   25OHVITD2 2.2 11/16/2018   25OHVITD3 21 11/16/2018     Inflammation (CRP: Acute Phase) (ESR: Chronic Phase) Lab Results  Component Value Date   CRP 2.1 (H) 07/17/2019   ESRSEDRATE 65 (H) 07/17/2019   LATICACIDVEN 1.4 04/28/2020       Note: Above Lab results reviewed.  Physical Exam  General appearance: Well nourished, well developed, and well hydrated. 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   Weight as of this encounter: 314 lb (142.4 kg). Ideal: Ideal body weight: 59.3 kg (130 lb 11.7 oz) Adjusted ideal body weight: 92.6 kg (204 lb 0.6 oz)   Patient comes today in a wheelchair.  Morbidly obese.  Lower extremity edema, pitting.  Left ankle in boot. Low back pain, bilateral hip pain, bilateral knee pain Neuropathic pain of bilateral lower extremity Musculoskeletal pain overlying various joints most pronounced in her elbow, wrist, hips, low back  Assessment   Diagnosis  1. Diabetic peripheral neuropathy ([place])   2. Neurogenic pain   3. Chronic elbow pain (Third area of Pain) (Bilateral) (L>R)   4. Neuropathic pain  5. Chronic foot pain (Primary Area of Pain) (Bilateral) (L>R)   6. Rheumatoid arthritis involving both shoulders with positive rheumatoid factor (HCC)   7. Chronic pain syndrome        Plan of Care  Ms. AARYA ROBINSON has a current medication list which includes the following long-term medication(s): albuterol, aripiprazole, calcium citrate-vitamin d, eliquis, famotidine, fluoxetine, fluoxetine, furosemide, pregabalin, quetiapine, quetiapine, zolpidem, and zolpidem.  Pharmacotherapy (Medications Ordered): Meds ordered this encounter  Medications   oxyCODONE-acetaminophen (PERCOCET) 5-325 MG tablet    Sig: Take 1 tablet by mouth every 8 (eight) hours as needed for severe pain. Must last 30 days.    Dispense:  90 tablet    Refill:  0    Chronic Pain: STOP Act (Not applicable) Fill 1 day early if closed on refill date. Avoid benzodiazepines within 8 hours of opioids   oxyCODONE-acetaminophen (PERCOCET) 5-325 MG tablet    Sig: Take 1 tablet by mouth every 8 (eight) hours as needed for severe pain.  Must last 30 days.    Dispense:  90 tablet    Refill:  0    Chronic Pain: STOP Act (Not applicable) Fill 1 day early if closed on refill date. Avoid benzodiazepines within 8 hours of opioids   Patient provided with surgeons letter regarding postoperative opioid prescribing patient is instructed to not overtake what I am prescribing her for acute postoperative pain. Continue multimodal analgesics as prescribed Discussed hyperbaric oxygen therapy 321. Continue weaning at next visit.  Hopefully down to twice daily as needed I have also discussed Qutenza capsaicin treatment for painful diabetic neuropathy however patient has Medicaid and they will not cover it.  Follow-up plan:   Return in about 8 weeks (around 07/02/2021) for Medication Management, in person.   Recent Visits No visits were found meeting these conditions. Showing recent visits within past 90 days and meeting all other requirements Today's Visits Date Type Provider Dept  05/07/21 Office Visit Gillis Santa, MD [facility]  Showing today's visits and meeting all other requirements Future Appointments Date Type Provider Dept  06/30/21 Appointment Gillis Santa, MD [facility]  Showing future appointments within next 90 days and meeting all other requirements  I discussed the assessment and treatment plan with the patient. The patient was provided an opportunity to ask questions and all were answered. The patient agreed with the plan and demonstrated an understanding of the instructions.  Patient advised to call back or seek an in-person evaluation if the symptoms or condition worsens.  Duration of encounter: 30 minutes.  Note by: Gillis Santa, MD Date: 05/07/2021; Time: 9:30 AM

## 2021-05-07 NOTE — Progress Notes (Signed)
Nursing Pain Medication Assessment:  Safety precautions to be maintained throughout the outpatient stay will include: orient to surroundings, keep bed in low position, maintain call bell within reach at all times, provide assistance with transfer out of bed and ambulation.  Medication Inspection Compliance: Ms. Harke did not comply with our request to bring her pills to be counted. She was reminded that bringing the medication bottles, even when empty, is a requirement.  Medication: None brought in. Pill/Patch Count: None available to be counted. Bottle Appearance: No container available. Did not bring bottle(s) to appointment. Filled Date: N/A Last Medication intake:  Today

## 2021-05-11 ENCOUNTER — Ambulatory Visit: Admit: 2021-05-11 | Discharge: 2021-05-11 | Payer: PRIVATE HEALTH INSURANCE

## 2021-05-11 ENCOUNTER — Ambulatory Visit
Admit: 2021-05-11 | Discharge: 2021-05-11 | Payer: PRIVATE HEALTH INSURANCE | Attending: Student in an Organized Health Care Education/Training Program | Primary: Student in an Organized Health Care Education/Training Program

## 2021-05-11 DIAGNOSIS — Z01818 Encounter for other preprocedural examination: Principal | ICD-10-CM

## 2021-05-11 DIAGNOSIS — G629 Polyneuropathy, unspecified: Principal | ICD-10-CM

## 2021-05-11 DIAGNOSIS — K219 Gastro-esophageal reflux disease without esophagitis: Principal | ICD-10-CM

## 2021-05-11 DIAGNOSIS — M0579 Rheumatoid arthritis with rheumatoid factor of multiple sites without organ or systems involvement: Principal | ICD-10-CM

## 2021-05-11 DIAGNOSIS — E039 Hypothyroidism, unspecified: Principal | ICD-10-CM

## 2021-05-11 DIAGNOSIS — E1169 Type 2 diabetes mellitus with other specified complication: Principal | ICD-10-CM

## 2021-05-11 DIAGNOSIS — I10 Essential (primary) hypertension: Principal | ICD-10-CM

## 2021-05-11 DIAGNOSIS — J449 Chronic obstructive pulmonary disease, unspecified: Principal | ICD-10-CM

## 2021-05-13 ENCOUNTER — Ambulatory Visit: Admit: 2021-05-13 | Discharge: 2021-05-13 | Payer: PRIVATE HEALTH INSURANCE

## 2021-05-13 ENCOUNTER — Encounter: Admit: 2021-05-13 | Discharge: 2021-05-13 | Payer: PRIVATE HEALTH INSURANCE

## 2021-05-13 HISTORY — PX: FOOT SURGERY: SHX648

## 2021-05-13 MED ORDER — METHOTREXATE SODIUM 2.5 MG TABLET
ORAL_TABLET | ORAL | 3 refills | 28 days | Status: CN
Start: 2021-05-13 — End: ?

## 2021-05-13 MED ORDER — LEVOFLOXACIN 750 MG TABLET
ORAL_TABLET | Freq: Every day | ORAL | 0 refills | 7.00000 days | Status: CP
Start: 2021-05-13 — End: 2021-05-20
  Filled 2021-05-13: qty 7, 7d supply, fill #0

## 2021-05-26 ENCOUNTER — Ambulatory Visit: Admit: 2021-05-26 | Discharge: 2021-05-26 | Payer: PRIVATE HEALTH INSURANCE

## 2021-06-02 ENCOUNTER — Other Ambulatory Visit: Payer: Self-pay | Admitting: Pulmonary Disease

## 2021-06-03 MED ORDER — METOPROLOL TARTRATE 25 MG TABLET
ORAL_TABLET | Freq: Two times a day (BID) | ORAL | 1 refills | 90 days | Status: CP
Start: 2021-06-03 — End: 2022-06-03

## 2021-06-03 MED ORDER — FAMOTIDINE 20 MG TABLET
ORAL_TABLET | Freq: Every day | ORAL | 1 refills | 90 days | Status: CP
Start: 2021-06-03 — End: 2022-06-03

## 2021-06-03 MED ORDER — CHOLECALCIFEROL (VITAMIN D3) 125 MCG (5,000 UNIT) CAPSULE
ORAL_CAPSULE | Freq: Every day | ORAL | 1 refills | 90 days | Status: CP
Start: 2021-06-03 — End: 2022-06-03

## 2021-06-05 ENCOUNTER — Ambulatory Visit: Admit: 2021-06-05 | Discharge: 2021-06-06 | Payer: PRIVATE HEALTH INSURANCE | Attending: Family | Primary: Family

## 2021-06-05 DIAGNOSIS — Z1211 Encounter for screening for malignant neoplasm of colon: Principal | ICD-10-CM

## 2021-06-05 DIAGNOSIS — E039 Hypothyroidism, unspecified: Principal | ICD-10-CM

## 2021-06-05 DIAGNOSIS — Z794 Long term (current) use of insulin: Principal | ICD-10-CM

## 2021-06-05 DIAGNOSIS — R110 Nausea: Principal | ICD-10-CM

## 2021-06-05 DIAGNOSIS — J449 Chronic obstructive pulmonary disease, unspecified: Principal | ICD-10-CM

## 2021-06-05 DIAGNOSIS — Z1212 Encounter for screening for malignant neoplasm of rectum: Principal | ICD-10-CM

## 2021-06-05 DIAGNOSIS — E1165 Type 2 diabetes mellitus with hyperglycemia: Principal | ICD-10-CM

## 2021-06-05 MED ORDER — PROCHLORPERAZINE MALEATE 5 MG TABLET
ORAL_TABLET | Freq: Three times a day (TID) | ORAL | 2 refills | 10.00000 days | Status: CP | PRN
Start: 2021-06-05 — End: 2021-07-05

## 2021-06-08 ENCOUNTER — Telehealth: Payer: Self-pay

## 2021-06-08 NOTE — Telephone Encounter (Signed)
went online to [email or web address] and submitted the prior auth . - pending 

## 2021-06-08 NOTE — Telephone Encounter (Signed)
received two fax requesting two prior auth. one on the aripiprazole and the other quetiapine.

## 2021-06-08 NOTE — Telephone Encounter (Signed)
received fax that aripiprazole 5mg  was approved from 06-08-21 to  06-08-22 trackin id # [id number]  quantity up to 28 tablets per 28 days

## 2021-06-08 NOTE — Telephone Encounter (Signed)
received fax that quetiapine was approved from 06-08-21 to  06-08-22

## 2021-06-09 ENCOUNTER — Telehealth (INDEPENDENT_AMBULATORY_CARE_PROVIDER_SITE_OTHER): Payer: Medicaid Other | Admitting: Psychiatry

## 2021-06-09 ENCOUNTER — Other Ambulatory Visit: Payer: Self-pay

## 2021-06-09 ENCOUNTER — Ambulatory Visit: Admit: 2021-06-09 | Discharge: 2021-06-10 | Payer: PRIVATE HEALTH INSURANCE

## 2021-06-09 DIAGNOSIS — M216X9 Other acquired deformities of unspecified foot: Principal | ICD-10-CM

## 2021-06-09 DIAGNOSIS — Z89419 Acquired absence of unspecified great toe: Principal | ICD-10-CM

## 2021-06-09 DIAGNOSIS — B351 Tinea unguium: Principal | ICD-10-CM

## 2021-06-09 DIAGNOSIS — Z9889 Other specified postprocedural states: Principal | ICD-10-CM

## 2021-06-09 DIAGNOSIS — F331 Major depressive disorder, recurrent, moderate: Secondary | ICD-10-CM

## 2021-06-09 DIAGNOSIS — F411 Generalized anxiety disorder: Secondary | ICD-10-CM

## 2021-06-09 DIAGNOSIS — F410 Panic disorder [episodic paroxysmal anxiety] without agoraphobia: Secondary | ICD-10-CM | POA: Diagnosis not present

## 2021-06-09 MED ORDER — ZOLPIDEM TARTRATE 5 MG PO TABS: 5.0000 mg | ORAL_TABLET | Freq: Every day | ORAL | 5 refills | Status: DC

## 2021-06-09 MED ORDER — QUETIAPINE FUMARATE 300 MG PO TABS: ORAL_TABLET | ORAL | 5 refills | Status: DC

## 2021-06-09 MED ORDER — BUSPIRONE HCL 10 MG PO TABS: 10.0000 mg | ORAL_TABLET | Freq: Two times a day (BID) | ORAL | 5 refills | Status: DC

## 2021-06-09 MED ORDER — FLUOXETINE HCL 40 MG PO CAPS: 40.0000 mg | ORAL_CAPSULE | Freq: Every day | ORAL | 5 refills | Status: DC

## 2021-06-09 NOTE — Progress Notes (Signed)
Virtual Visit via Telephone Note  I connected with [name] [name] on 06/09/21 at  1:00 PM EST by telephone and verified that I am speaking with the correct person using two identifiers.  Location: Patient: Home Provider: Hospital   I discussed the limitations, risks, security and privacy concerns of performing an evaluation and management service by telephone and the availability of in person appointments. I also discussed with the patient that there may be a patient responsible charge related to this service. The patient expressed understanding and agreed to proceed.   History of Present Illness: Patient reached by telephone.  She had no new complaints.  Reports that her mood and anxiety are under good control.  She is having some medical problems getting some surgery done recently which has been stressful but she is coping with it fine.  Sleeps well.  Denies any hallucinations.  Denies suicidal thoughts.    Observations/Objective: Affect euthymic.  Sounded a little sluggish but alert and oriented no evidence of psychosis.  No evidence of self-harm   Assessment and Plan: Continue current medications.  Reviewed all medicines with patients.  Orders placed for another 6 months   Follow Up Instructions: Follow-up 6 months    I discussed the assessment and treatment plan with the patient. The patient was provided an opportunity to ask questions and all were answered. The patient agreed with the plan and demonstrated an understanding of the instructions.   The patient was advised to call back or seek an in-person evaluation if the symptoms worsen or if the condition fails to improve as anticipated.  I provided 20 minutes of non-face-to-face time during this encounter.   Alethia Berthold, MD

## 2021-06-11 ENCOUNTER — Telehealth: Payer: Self-pay | Admitting: Student in an Organized Health Care Education/Training Program

## 2021-06-11 NOTE — Telephone Encounter (Signed)
Called [facility], script for Oxy/Acet, to be filled 06-06-21, is there. Patient notified.

## 2021-06-11 NOTE — Telephone Encounter (Signed)
Patient called pharmacy to fill script and was told they dont have a script. There is a script in her chart to fill on 06-06-21. Please call pharmacy and advise patient of what to do. Thanks.

## 2021-06-15 ENCOUNTER — Institutional Professional Consult (permissible substitution): Admit: 2021-06-15 | Discharge: 2021-06-16 | Payer: PRIVATE HEALTH INSURANCE

## 2021-06-25 ENCOUNTER — Ambulatory Visit (INDEPENDENT_AMBULATORY_CARE_PROVIDER_SITE_OTHER): Payer: Medicaid Other | Admitting: Pulmonary Disease

## 2021-06-25 ENCOUNTER — Other Ambulatory Visit: Payer: Self-pay

## 2021-06-25 ENCOUNTER — Encounter: Payer: Self-pay | Admitting: Pulmonary Disease

## 2021-06-25 VITALS — BP 100/68 | HR 66 | Temp 98.0°F | Ht 66.0 in | Wt 316.0 lb

## 2021-06-25 DIAGNOSIS — Z6841 Body Mass Index (BMI) 40.0 and over, adult: Secondary | ICD-10-CM

## 2021-06-25 DIAGNOSIS — J42 Unspecified chronic bronchitis: Secondary | ICD-10-CM | POA: Diagnosis not present

## 2021-06-25 DIAGNOSIS — J9611 Chronic respiratory failure with hypoxia: Secondary | ICD-10-CM

## 2021-06-25 DIAGNOSIS — J8410 Pulmonary fibrosis, unspecified: Secondary | ICD-10-CM | POA: Diagnosis not present

## 2021-06-25 DIAGNOSIS — R0602 Shortness of breath: Secondary | ICD-10-CM

## 2021-06-25 DIAGNOSIS — M069 Rheumatoid arthritis, unspecified: Secondary | ICD-10-CM

## 2021-06-25 DIAGNOSIS — R0601 Orthopnea: Secondary | ICD-10-CM

## 2021-06-25 NOTE — Patient Instructions (Signed)
Take  the Stiolto daily that is 2 puffs in the morning. ? ?Continue using your oxygen as you are doing. ? ?You are getting an echocardiogram to see about your heart function. ? ?We will see you in follow-up in 3 months time call sooner should any new problems arise. ? ? ?

## 2021-06-25 NOTE — Progress Notes (Signed)
Subjective:    Patient ID: [name] [name], female    DOB: 28-Jun-1960, 61 y.o.   MRN: [id number] Patient Care Team: Verita Lamb, NP as PCP - General [name] Montane, RN as Case Manager  Chief Complaint  Patient presents with   Follow-up    HPI [name] [name] is a 61 year old former smoker (57 PY) with a very complex history as noted below.  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  This is a scheduled visit.  She presents today with her mother who usually accompanies her during her physician visits.  The patient also has been noted to have right DVT and is on Eliquis.  She is currently on 3 L/min nasal cannula O2 with activity and sleep and tolerating this well.  She has noted recently some issues with orthopnea which are new for her.  No chest pain.  No diaphoresis.  She is walking around the home with a walker.  Her prior visit she was given Stiolto 2 replace DuoNeb as she found these cumbersome to use.  Despite the orthopnea she has not had paroxysmal nocturnal dyspnea.  She does have episodes of dyspnea in the day that she attributes to "panic attacks" she notes that when she takes Xanax this helps tremendously.  She gets Xanax from her [facility] care.  She does not voice any other complaint.    Review of Systems A 10 point review of systems was performed and it is as noted above otherwise negative.  Patient Active Problem List   Diagnosis Date Noted   Morbid obesity ([place])    Depression with anxiety    PAF (paroxysmal atrial fibrillation) ([place])    Chronic respiratory failure with hypoxia ([place]) 02/07/2020   Paroxysmal A-fib ([place])    Morbid obesity with BMI of 50.0-59.9, adult ([place]) 07/08/2019   Controlled type 2 diabetes mellitus without complication, without long-term current use of insulin ([place])  04/06/2019   CKD (chronic kidney disease), stage III ([place]) 04/06/2019   HLD (hyperlipidemia) 04/06/2019   AKI (acute kidney injury) ([place]) 02/12/2019   Diabetic ulcer of left foot ([place]) 02/14/2018   Chronic pain syndrome 03/31/2016   Rheumatoid arthritis ([place]) 12/25/2015   Osteoarthritis, multiple sites 12/24/2015   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[employer]) 12/23/2015   Long term current use of opiate analgesic 12/23/2015   Long term prescription opiate use 12/23/2015   Diabetic peripheral neuropathy ([place]) 12/23/2015   Chronic diastolic CHF (congestive heart failure) ([place]) 07/03/2014   COPD (chronic obstructive pulmonary disease) ([place]) 01/03/2014   Bipolar disorder, unspecified ([place]) 01/03/2014   Borderline personality disorder ([place]) 01/06/2012   Hypothyroidism 06/28/2010   Rheumatoid arthritis involving multiple sites with positive rheumatoid factor ([place]) 06/28/2010   Essential (primary) hypertension 01/27/2005   Major depressive disorder, recurrent episode, moderate ([place]) 06/03/2004   Social History   Tobacco Use   Smoking status: Former    Packs/day: 2.00    Years: 27.00    Total pack years: 54.00    Types: Cigarettes    Quit date: 07/30/1999    Years since quitting: 22.9   Smokeless tobacco: Never   Tobacco comments:    quit in 2001 2-2.5 a day  Substance Use Topics   Alcohol use: No   Allergies  Allergen Reactions   Cephalexin Hives   Codeine Palpitations, Nausea Only, Nausea And Vomiting,  Rash and Shortness Of Breath    "makes heart fly, she gets flushed and passes out"   Doxycycline Rash   Propoxyphene Rash and Shortness Of Breath    Increase heart rate   Sulfa Antibiotics Palpitations, Nausea Only, Shortness Of Breath and Hives    "makes heart fly, she gets flushed and passes out"   Clindamycin Rash    Tongue swelling, oral sores, and Mouth rash   Lovenox [Enoxaparin Sodium] Hives   Hydrocodone Nausea And Vomiting    Hear racing & breaks  out into a cold sweat.   Meropenem Rash    Erythematous, hot, pruritic rash over arms, chest, back, abdomen, and face occurred at the end of meropenem infusion on 02/22/18   Medications were reviewed with the patient at the time of visit.  Immunization History  Administered Date(s) Administered   Influenza, Seasonal, Injecte, Preservative Fre 02/04/2005   Influenza,inj,Quad PF,6+ Mos 04/14/2015, 03/23/2017, 02/07/2018, 02/01/2019, 02/07/2020, 02/08/2021   Influenza-Unspecified 02/02/2019   PFIZER(Purple Top)SARS-COV-2 Vaccination 03/18/2020, 04/08/2020, 06/03/2020   Pfizer Covid-19 Vaccine Bivalent Booster 40yr & up 07/13/2021   Pneumococcal Conjugate-13 04/22/2014   Pneumococcal Polysaccharide-23 03/23/2017, 04/16/2019      Objective:   Physical Exam BP 100/68 (BP Location: Left Arm, Patient Position: Sitting, Cuff Size: Normal)   Pulse 66   Temp 98 F (36.7 C) (Oral)   Ht '5\' 6"'$  (1.676 m)   Wt (!) 316 lb (143.3 kg)   LMP 04/20/2001   SpO2 92%   BMI 51.00 kg/m   GENERAL: Morbidly obese woman, presents in transport chair.  No respiratory distress.  Comfortable with nasal cannula O2 oxygen on.  No conversational dyspnea. HEAD: Normocephalic, atraumatic. EYES: Pupils equal, round, reactive to light.  No scleral icterus. MOUTH: Nose/mouth/throat not examined due to masking requirements for COVID 19. NECK: Supple. No thyromegaly. Trachea midline. No JVD.  No adenopathy. PULMONARY: Good air entry bilaterally.  No adventitious sounds. CARDIOVASCULAR: S1 and S2. Regular rate and rhythm.  No rubs, murmurs or gallops heard. ABDOMEN: Obese, soft, nondistended ventral hernia present. MUSCULOSKELETAL: RA changes both hands.  No clubbing or edema noted, no increased warmth in the lower extremities.  No dressings in the lower extremities.  Chronic stasis changes but no overt edema. NEUROLOGIC: No overt focal deficit, speech is fluent. SKIN: Intact,warm,dry.  Chronic stasis changes lower  extremities.   PSYCH: Very interactive today.  Mood appropriate.  No psychomotor retardation.       Assessment & Plan:     ICD-10-CM   1. Orthopnea  R06.01 ECHOCARDIOGRAM COMPLETE   New symptom for the patient Check echocardiogram    2. Chronic respiratory failure with hypoxia (HCC)  J96.11    Due to obesity with alveolar hypoventilation Sleep-related hypoxemia due to obesity Continue oxygen supplementation with exertion and sleep    3. Chronic bronchitis, unspecified chronic bronchitis type (HOdin  J42    Use Stiolto daily not just as needed Continue albuterol as needed    4. Pulmonary fibrosis, postinflammatory (HCC)  J84.10    Post COVID-19 No progression    5. Rheumatoid arthritis  M06.9    This issue adds complexity to her management Follows with rheumatology    6. Morbid obesity with BMI of 50.0-59.9, adult (HCC)  E66.01    Z68.43    This issue adds complexity to her management Will benefit from weight loss     Will see the patient in follow-up in 3 months time she is to contact [facility] to that  time should any new difficulties arise.  Renold Don, MD Advanced Bronchoscopy PCCM [facility]    *This note was dictated using voice recognition software/Dragon.  Despite best efforts to proofread, errors can occur which can change the meaning. Any transcriptional errors that result from this process are unintentional and may not be fully corrected at the time of dictation.

## 2021-06-26 ENCOUNTER — Other Ambulatory Visit: Payer: Self-pay | Admitting: Pulmonary Disease

## 2021-06-26 LAB — COLOGUARD: COLOGUARD: NEGATIVE

## 2021-06-30 ENCOUNTER — Ambulatory Visit
Payer: Medicaid Other | Attending: Student in an Organized Health Care Education/Training Program | Admitting: Student in an Organized Health Care Education/Training Program

## 2021-06-30 ENCOUNTER — Encounter: Payer: Self-pay | Admitting: Student in an Organized Health Care Education/Training Program

## 2021-06-30 ENCOUNTER — Other Ambulatory Visit: Payer: Self-pay

## 2021-06-30 VITALS — BP 107/74 | HR 65 | Temp 97.3°F | Resp 16 | Ht 66.0 in | Wt 315.0 lb

## 2021-06-30 DIAGNOSIS — M059 Rheumatoid arthritis with rheumatoid factor, unspecified: Secondary | ICD-10-CM | POA: Insufficient documentation

## 2021-06-30 DIAGNOSIS — M05712 Rheumatoid arthritis with rheumatoid factor of left shoulder without organ or systems involvement: Secondary | ICD-10-CM | POA: Diagnosis present

## 2021-06-30 DIAGNOSIS — G894 Chronic pain syndrome: Secondary | ICD-10-CM | POA: Diagnosis present

## 2021-06-30 DIAGNOSIS — M05711 Rheumatoid arthritis with rheumatoid factor of right shoulder without organ or systems involvement: Secondary | ICD-10-CM | POA: Diagnosis present

## 2021-06-30 DIAGNOSIS — M25532 Pain in left wrist: Secondary | ICD-10-CM | POA: Diagnosis present

## 2021-06-30 DIAGNOSIS — M19011 Primary osteoarthritis, right shoulder: Secondary | ICD-10-CM | POA: Diagnosis present

## 2021-06-30 DIAGNOSIS — E1142 Type 2 diabetes mellitus with diabetic polyneuropathy: Secondary | ICD-10-CM | POA: Insufficient documentation

## 2021-06-30 DIAGNOSIS — M79673 Pain in unspecified foot: Secondary | ICD-10-CM | POA: Insufficient documentation

## 2021-06-30 DIAGNOSIS — M25531 Pain in right wrist: Secondary | ICD-10-CM | POA: Insufficient documentation

## 2021-06-30 DIAGNOSIS — M792 Neuralgia and neuritis, unspecified: Secondary | ICD-10-CM | POA: Diagnosis not present

## 2021-06-30 DIAGNOSIS — M25529 Pain in unspecified elbow: Secondary | ICD-10-CM | POA: Diagnosis present

## 2021-06-30 DIAGNOSIS — G8929 Other chronic pain: Secondary | ICD-10-CM | POA: Insufficient documentation

## 2021-06-30 MED ORDER — OXYCODONE-ACETAMINOPHEN 5-325 MG PO TABS: 1.0000 | ORAL_TABLET | Freq: Three times a day (TID) | ORAL | 0 refills | Status: AC | PRN

## 2021-06-30 MED ORDER — OXYCODONE-ACETAMINOPHEN 5-325 MG PO TABS
1.0000 | ORAL_TABLET | Freq: Three times a day (TID) | ORAL | 0 refills | Status: DC | PRN
Start: 2021-09-09 — End: 2021-09-24

## 2021-06-30 NOTE — Patient Instructions (Signed)
Do not stop Eliquis per Dr Cherylann RatelLateef ? ?Preparing for your procedure (without sedation) ?Instructions: ?Oral Intake: Do not eat or drink anything for at least 3 hours prior to your procedure. ?Transportation: Unless otherwise stated by your physician, you may drive yourself after the procedure. ?Blood Pressure Medicine: Take your blood pressure medicine with a sip of water the morning of the procedure. ?Insulin: Take only ? of your normal insulin dose. ?Preventing infections: Shower with an antibacterial soap the morning of your procedure. ?Build-up your immune system: Take 1000 mg of Vitamin C with every meal (3 times a day) the day prior to your procedure. ?Pregnancy: If you are pregnant, call and cancel the procedure. ?Sickness: If you have a cold, fever, or any active infections, call and cancel the procedure. ?Arrival: You must be in the facility at least 30 minutes prior to your scheduled procedure. ?Children: Do not bring any children with you. ?Dress appropriately: Bring dark clothing that you would not mind if they get stained. ?Valuables: Do not bring any jewelry or valuables. ?Procedure appointments are reserved for interventional treatments only. ?No Prescription Refills. ?No medication changes will be discussed during procedure appointments. ?No disability issues will be discussed.  ?

## 2021-06-30 NOTE — Progress Notes (Signed)
Nursing Pain Medication Assessment:  ?Safety precautions to be maintained throughout the outpatient stay will include: orient to surroundings, keep bed in low position, maintain call bell within reach at all times, provide assistance with transfer out of bed and ambulation.  ?Medication Inspection Compliance: Pill count conducted under aseptic conditions, in front of the patient. Neither the pills nor the bottle was removed from the patient's sight at any time. Once count was completed pills were immediately returned to the patient in their original bottle. ? ?Medication: Oxycodone/APAP ?Pill/Patch Count:  32 of 90 pills remain ?Pill/Patch Appearance: Markings consistent with prescribed medication ?Bottle Appearance: Standard pharmacy container. Clearly labeled. ?Filled Date: 02 / 23 / 2023 ?Last Medication intake:  Yesterday ?

## 2021-06-30 NOTE — Progress Notes (Signed)
PROVIDER NOTE: Information contained herein reflects review and annotations entered in association with encounter. Interpretation of such information and data should be left to medically-trained personnel. Information provided to patient can be located elsewhere in the medical record under "Patient Instructions". Document created using STT-dictation technology, any transcriptional errors that may result from process are unintentional.  ?  ?Patient: [name] [name]  Service Category: E/M  Provider: Gillis Santa, MD  ?DOB: 1960-07-09  DOS: 06/30/2021  Specialty: Interventional Pain Management  ?MRN: [id number]  Setting: Ambulatory outpatient  PCP: [facility]  ?Type: Established Patient    Referring Provider: [facility]  ?Location: Office  Delivery: Face-to-face    ? ?HPI  ?[name], a 61 y.o. year old female, is here today because of her Diabetic peripheral neuropathy ([place]) [E11.42]. Ms. Dunston primary complain today is low back and bilateral leg pain ?Last encounter: My last encounter with her was on 05/07/21 ?Pertinent problems: Ms. Manu has Chronic wrist pain (Secondary area of Pain) (Bilateral) (L>R); Chronic knee pain (Bilateral) (R>L); Long term current use of opiate analgesic; Opiate use (30 MME/Day); Neurogenic pain; Neuropathic pain; Diabetic peripheral neuropathy ([place]); Osteoarthritis, multiple sites; Chronic foot pain (Primary Area of Pain) (Bilateral) (L>R); Chronic elbow pain (Third area of Pain) (Bilateral) (L>R); Chronic upper back pain; Chronic pain syndrome; Rheumatoid arthritis involving multiple sites with positive rheumatoid factor ([place]); CKD (chronic kidney disease), stage III ([place]); Morbid obesity with BMI of 50.0-59.9, adult ([place]); and Severe sepsis with septic shock ([place]) on their pertinent problem list. ?Pain Assessment: Severity of Chronic pain is reported as a 7 /10. Location: Other (Comment) (generalized body pain)  /shooting pain through hands to fingers; NEW  shoulder pain radiating to right bicep. Onset: More than a month ago. Quality: Dull. Timing: Constant. Modifying factor(s): meds. ?Vitals:  height is 5\' 6"  (1.676 m) and weight is 315 lb (142.9 kg) (abnormal). Her temporal temperature is 97.3 ?F (36.3 ?C) (abnormal). Her blood pressure is 107/74 and her pulse is 65. Her respiration is 16 and oxygen saturation is 100%.  ? ?Reason for encounter: medication management.   ? ?Patient presents today for medication management.  She did have a fall last week when she tripped.  She is endorsing increased right shoulder pain related to shoulder osteoarthritis.  This is a new pain for her.  She states that she has received steroid injections in his right shoulder over 5 years ago.  We discussed repeating.  Otherwise I will refill her oxycodone as below for chronic pain management.  She continues on Lyrica 100 mg 3 times a day. ? ?I have cautioned her on her Ambien prescription. ? ? ?Pharmacotherapy Assessment  ?Analgesic: Percocet 5 mg TID PRN   ? ?Monitoring: ?[place] PMP: PDMP reviewed during this encounter.       ?Pharmacotherapy: No side-effects or adverse reactions reported. ?Compliance: No problems identified. ?Effectiveness: Clinically acceptable.  UDS:  ?Summary  ?Date Value Ref Range Status  ?09/16/2020 Note  Final  ?  Comment:  ?  ==================================================================== ?Tox[employer] Select 13 (MW) ?==================================================================== ?Test                             Result       Flag       Units ? ?Drug Present and Declared for Prescription Verification ?  Oxycodone  615          EXPECTED   ng/mg creat ?  Noroxycodone                   587          EXPECTED   ng/mg creat ?   Sources of oxycodone include scheduled prescription medications. ?   Noroxycodone is an expected metabolite of oxycodone. ? ?Drug Absent but Declared for Prescription Verification ?  Alprazolam                     Not  Detected UNEXPECTED ng/mg creat ?==================================================================== ?Test                      Result    Flag   Units      Ref Range ?  Creatinine              139              mg/dL      >=20 ?==================================================================== ?Declared Medications: ? The flagging and interpretation on this report are based on the ? following declared medications.  Unexpected results may arise from ? inaccuracies in the declared medications. ? ? **Note: The testing scope of this panel includes these medications: ? ? Alprazolam (Xanax) ? Oxycodone (Percocet) ? ? **Note: The testing scope of this panel does not include the ? following reported medications: ? ? Acetaminophen (Tylenol) ? Acetaminophen (Percocet) ? Albuterol (Duoneb) ? Apixaban (Eliquis) ? Aripiprazole (Abilify) ? Atorvastatin ? Budesonide (Pulmicort) ? Calcium ? Cholecalciferol ? Fluoxetine (Prozac) ? Folic Acid ? Furosemide ? Insulin (Humalog) ? Ipratropium (Duoneb) ? Levothyroxine ? Linagliptin (Tradjenta) ? Metformin ? Mupirocin ? Polyethylene Glycol (MiraLAX) ? Pramipexole (Mirapex) ? Pregabalin (Lyrica) ? Quetiapine (Seroquel) ? Solifenacin (Vesicare) ? Vancomycin ? Vitamin C ? Vitamin D ? Zinc ? Zolpidem (Ambien) ?==================================================================== ?For clinical consultation, please call [phone]. ?==================================================================== ?  ?  ? ? ?ROS  ?Constitutional:  Intermittent shortness of breath ?Gastrointestinal: No reported hemesis, hematochezia, vomiting, or acute GI distress ?Musculoskeletal:  Low back pain, left ankle pain, bilateral shoulder pain right greater than left ?Neurological: No reported episodes of acute onset apraxia, aphasia, dysarthria, agnosia, amnesia, paralysis, loss of coordination, or loss of consciousness ? ?Medication Review  ?ALPRAZolam, ARIPiprazole, FLUoxetine, Insulin Degludec, QUEtiapine,  Tiotropium Bromide-Olodaterol, Vitamin D, acetaminophen, albuterol, apixaban, ascorbic acid, busPIRone, calcium citrate-vitamin D, famotidine, folic acid, furosemide, hydroxychloroquine, levothyroxine, linagliptin, metFORMIN, methotrexate, metoprolol tartrate, mupirocin ointment, oxyCODONE-acetaminophen, polyethylene glycol, pramipexole, pregabalin, solifenacin, zinc sulfate, and zolpidem ? ?History Review  ?Allergy: Ms. Cohill is allergic to cephalexin, codeine, doxycycline, propoxyphene, sulfa antibiotics, clindamycin, lovenox [enoxaparin sodium], hydrocodone, and meropenem. ?Drug: Ms. Gebert  reports no history of drug use. ?Alcohol:  reports no history of alcohol use. ?Tobacco:  reports that she quit smoking about 21 years ago. Her smoking use included cigarettes. She has a 54.00 pack-year smoking history. She has never used smokeless tobacco. ?Social: Ms. Zaccheo  reports that she quit smoking about 21 years ago. Her smoking use included cigarettes. She has a 54.00 pack-year smoking history. She has never used smokeless tobacco. She reports that she does not drink alcohol and does not use drugs. ?Medical:  has a past medical history of Abdominal wall hernia (01/29/2013), Anxiety, Arthritis, C. difficile colitis, Chronic diastolic heart failure ([place]), COVID-19 (03/23/2019), Depression, Diabetes mellitus, Diastolic CHF ([place]), Esophagitis, Fluid retention, GERD (gastroesophageal reflux disease), Hiatal hernia, Hypertension, Hypokalemia due to loss  of potassium (10/21/2015), Hypothyroidism, IBS (irritable bowel syndrome), Moderate episode of recurrent major depressive disorder ([place]) (06/03/2004), Morbid obesity ([place]), MRSA (methicillin resistant Staphylococcus aureus) infection (11/2017), Neurogenic bladder, Neuropathy, Obesity, Panic attacks, Pneumonia due to COVID-19 virus, Rheumatoid arthritis ([place]), and Sleep apnea. ?Surgical: Ms. Batzer  has a past surgical history that includes Tubal ligation; Tonsillectomy;  Cholecystectomy; Abdominal hysterectomy; Laparoscopic gastric banding (03/20/2007); Eye surgery; Hernia repair; DG GREAT TOE RIGHT FOOT (02/23/2018); TEE without cardioversion (N/A, 07/16/2019); Irrigation and de

## 2021-07-01 ENCOUNTER — Ambulatory Visit: Admit: 2021-07-01 | Discharge: 2021-07-02 | Payer: PRIVATE HEALTH INSURANCE

## 2021-07-01 DIAGNOSIS — T148XXA Other injury of unspecified body region, initial encounter: Principal | ICD-10-CM

## 2021-07-01 DIAGNOSIS — E139 Other specified diabetes mellitus without complications: Principal | ICD-10-CM

## 2021-07-01 MED ORDER — METFORMIN ER 500 MG TABLET,EXTENDED RELEASE 24 HR
ORAL_TABLET | 3 refills | 0 days | Status: CP
Start: 2021-07-01 — End: ?

## 2021-07-02 ENCOUNTER — Other Ambulatory Visit: Payer: Self-pay | Admitting: *Deleted

## 2021-07-02 MED ORDER — PREGABALIN 100 MG PO CAPS: 100.0000 mg | ORAL_CAPSULE | Freq: Three times a day (TID) | ORAL | 5 refills | Status: DC

## 2021-07-02 MED ORDER — TRADJENTA 5 MG TABLET
ORAL_TABLET | Freq: Every day | ORAL | 1 refills | 90 days | Status: CP
Start: 2021-07-02 — End: 2022-07-03

## 2021-07-09 MED ORDER — METHOTREXATE SODIUM 2.5 MG TABLET
ORAL_TABLET | ORAL | 3 refills | 28 days | Status: CN
Start: 2021-07-09 — End: ?

## 2021-07-13 ENCOUNTER — Ambulatory Visit
Admit: 2021-07-13 | Discharge: 2021-07-14 | Payer: PRIVATE HEALTH INSURANCE | Attending: Student in an Organized Health Care Education/Training Program | Primary: Student in an Organized Health Care Education/Training Program

## 2021-07-13 DIAGNOSIS — M0579 Rheumatoid arthritis with rheumatoid factor of multiple sites without organ or systems involvement: Principal | ICD-10-CM

## 2021-07-13 DIAGNOSIS — Z23 Encounter for immunization: Principal | ICD-10-CM

## 2021-07-13 MED ORDER — HYDROXYCHLOROQUINE 200 MG TABLET
ORAL_TABLET | Freq: Two times a day (BID) | ORAL | 2 refills | 30 days | Status: CP
Start: 2021-07-13 — End: 2021-10-11

## 2021-07-14 ENCOUNTER — Other Ambulatory Visit: Payer: Medicaid Other

## 2021-07-16 ENCOUNTER — Other Ambulatory Visit: Payer: Self-pay | Admitting: Pulmonary Disease

## 2021-07-17 MED ORDER — APIXABAN 5 MG TABLET
ORAL_TABLET | Freq: Two times a day (BID) | ORAL | 1 refills | 90.00000 days | Status: CP
Start: 2021-07-17 — End: 2022-07-17

## 2021-07-21 ENCOUNTER — Ambulatory Visit: Admit: 2021-07-21 | Discharge: 2021-07-22 | Payer: PRIVATE HEALTH INSURANCE

## 2021-07-30 ENCOUNTER — Telehealth
Admit: 2021-07-30 | Discharge: 2021-07-31 | Payer: PRIVATE HEALTH INSURANCE | Attending: Student in an Organized Health Care Education/Training Program | Primary: Student in an Organized Health Care Education/Training Program

## 2021-08-03 ENCOUNTER — Ambulatory Visit: Admit: 2021-08-03 | Discharge: 2021-08-04 | Payer: PRIVATE HEALTH INSURANCE

## 2021-08-19 ENCOUNTER — Other Ambulatory Visit: Payer: Self-pay | Admitting: Psychiatry

## 2021-08-19 MED ORDER — ARIPIPRAZOLE 5 MG PO TABS
5.0000 mg | ORAL_TABLET | Freq: Every day | ORAL | 1 refills | Status: DC
Start: 2021-08-19 — End: 2021-12-15

## 2021-08-22 ENCOUNTER — Ambulatory Visit
Admission: EM | Admit: 2021-08-22 | Discharge: 2021-08-22 | Disposition: A | Discharge status: Home / Self Care | Payer: Medicaid Other

## 2021-08-22 ENCOUNTER — Encounter: Payer: Self-pay | Admitting: Emergency Medicine

## 2021-08-22 ENCOUNTER — Other Ambulatory Visit: Payer: Self-pay

## 2021-08-22 ENCOUNTER — Emergency Department
Admit: 2021-08-22 | Discharge: 2021-08-23 | Disposition: A | Discharge status: Home / Self Care | Payer: PRIVATE HEALTH INSURANCE | Attending: Emergency Medicine

## 2021-08-22 ENCOUNTER — Ambulatory Visit
Admit: 2021-08-22 | Discharge: 2021-08-23 | Disposition: A | Discharge status: Home / Self Care | Payer: PRIVATE HEALTH INSURANCE | Attending: Emergency Medicine

## 2021-08-22 DIAGNOSIS — L03116 Cellulitis of left lower limb: Principal | ICD-10-CM

## 2021-08-22 MED ORDER — LINEZOLID 600 MG TABLET
ORAL_TABLET | Freq: Two times a day (BID) | ORAL | 0 refills | 7 days | Status: CP
Start: 2021-08-22 — End: 2021-08-29

## 2021-08-22 NOTE — Discharge Instructions (Addendum)
Please go to the emergency department at [facility] for further evaluation of the cellulitis in your left lower extremity and for IV antibiotics.  As we discussed, you may very well be admitted in order to receive several rounds of IV antibiotics before being transitioned to oral. ?

## 2021-08-22 NOTE — ED Notes (Signed)
Patient is being discharged from the Urgent Care and sent to the [facility] Emergency Department via private vehicle with her mother . Per Becky Augusta, patient is in need of higher level of care due to worsening cellulitis and needing IV antibiotics. Patient is aware and verbalizes understanding of plan of care.  ?Vitals:  ? 08/22/21 1512  ?BP: (!) 118/53  ?Pulse: 67  ?Resp: 15  ?Temp: 99 ?F (37.2 ?C)  ?SpO2: 99%  ?  ?

## 2021-08-22 NOTE — ED Provider Notes (Signed)
?[place] ? ? ? ?CSN: [id number] ?Arrival date & time: 08/22/21  1440 ? ? ?  ? ?History   ?Chief Complaint ?Chief Complaint  ?Patient presents with  ? Cellulitis  ? Leg Pain  ?  Left lower leg  ? ? ?HPI ?[name] [name] is a 61 y.o. female.  ? ?HPI ? ?61 year old female here for evaluation of left lower extremity redness. ? ?Patient is here with family member for evaluation of redness that she noticed 2 days ago to her left lower extremity.  The following day the redness increased and the lower leg became painful and swollen.  During the middle night a blister developed and ruptured and there has been a continuous thick serous fluid draining from the wound since.  Patient denies any fever, chills, or sweats.  She has not been monitoring her sugar as her machine is not currently functioning.  She does have a history of diabetes as well as COVID lung and is on oxygen at 3 L/min. ? ?Past Medical History:  ?Diagnosis Date  ? Abdominal wall hernia 01/29/2013  ? Anxiety   ? Arthritis   ? Rheumatoid  ? C. difficile colitis   ? Chronic diastolic heart failure ([place])   ? COVID-19 03/23/2019  ? Diagnosed at [facility] (send-out) on 03/23/2019  ? Depression   ? Diabetes mellitus   ? states no meds or diet restrictions  at present  ? Diastolic CHF ([place])   ? Esophagitis   ? Fluid retention   ? GERD (gastroesophageal reflux disease)   ? Hiatal hernia   ? Hypertension   ? Hypokalemia due to loss of potassium 10/21/2015  ? Overview:  Associated with 3 weeks of diarrhea  And QT prolongation.  ? Hypothyroidism   ? IBS (irritable bowel syndrome)   ? Moderate episode of recurrent major depressive disorder ([place]) 06/03/2004  ? Morbid obesity ([place])   ? MRSA (methicillin resistant Staphylococcus aureus) infection 11/2017  ? left inner thigh abcess  ? Neurogenic bladder   ? has pacemaker  ? Neuropathy   ? Obesity   ? Panic attacks   ? Pneumonia due to COVID-19 virus   ? Rheumatoid arthritis ([place])   ? Sleep apnea   ? STATES SEVERE, CANT  TOLERATE MASK- LAST STUDY YEARS AGO  ? ? ?Patient Active Problem List  ? Diagnosis Date Noted  ? DVT (deep vein thrombosis) in pregnancy 05/27/2020  ? DVT of popliteal vein ([place]) 05/27/2020  ? 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 Chronic respiratory failure with hypoxia ([place]) 02/07/2020  ? Anemia 02/07/2020  ? Lower extremity cellulitis 01/10/2020  ? Bacteremia   ? Paroxysmal A-fib ([place])   ? Right arm weakness   ? Ileus ([place])   ? Splenic infarct   ? Acute metabolic encephalopathy [id number]  ? Hyperglycemia due to type 2 diabetes mellitus ([place]) 07/08/2019  ? Morbid obesity with BMI of 50.0-59.9, adult ([place]) 07/08/2019  ? Severe sepsis with septic shock ([place]) 07/08/2019  ? Septic shock ([place]) 07/08/2019  ? History of severe acute respiratory syndrome coronavirus 2 (SARS-CoV-2) disease 05/24/2019  ? Acute respiratory disease due to COVID-19 virus 04/06/2019  ? Type 2 diabetes mellitus without complication, without long-term current use of insulin ([place]) 04/06/2019  ? CKD (chronic kidney disease), stage III ([place]) 04/06/2019  ? HLD (hyperlipidemia) 04/06/2019  ? COVID-19 03/27/2019  ? Pneumonia due to COVID-19 virus 03/27/2019  ? Abdominal pain 02/12/2019  ? AKI (acute kidney injury) ([place]) 02/12/2019  ? Cellulitis,  abdominal wall 02/04/2019  ? Elevated C-reactive protein (CRP) 12/15/2018  ? Elevated sed rate 12/15/2018  ? Pharmacologic therapy 11/06/2018  ? Disorder of skeletal system 11/06/2018  ? Problems influencing health status 11/06/2018  ? Decubitus ulcer of heel, bilateral 02/21/2018  ? Diabetic ulcer of toe of right foot associated with type 2 diabetes mellitus ([place]) 02/14/2018  ? Ulcer of left heel and midfoot with fat layer exposed ([place]) 02/14/2018  ? Sepsis due to methicillin resistant Staphylococcus aureus (MRSA) without acute organ dysfunction ([place]) 11/17/2017  ? MRSA (methicillin resistant Staphylococcus aureus) infection 11/17/2017  ? Cellulitis 11/15/2017  ? Perineal abscess 11/15/2017   ? Subacute vulvitis 11/01/2017  ? Chronic cystitis 03/01/2017  ? Chronic pain syndrome 03/31/2016  ? Insomnia secondary to chronic pain 03/31/2016  ? Chronic upper back pain 12/25/2015  ? Chronic hand pain (Bilateral) (L>R) 12/25/2015  ? Rheumatoid arthritis ([place]) 12/25/2015  ? Osteoarthritis, multiple sites 12/24/2015  ? Chronic foot pain (Primary Area of Pain) (Bilateral) (L>R) 12/24/2015  ? Chronic elbow pain (Third area of Pain) (Bilateral) (L>R) 12/24/2015  ? Chronic shoulder pain (Bilateral) (L>R) 12/24/2015  ? Chronic neck pain (Bilateral) (R>L) 12/24/2015  ? 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[employer]) 12/23/2015  ? Chronic knee pain (Bilateral) (R>L) 12/23/2015  ? Long term current use of opiate analgesic 12/23/2015  ? Long term prescription opiate use 12/23/2015  ? Opiate use (30 MME/Day) 12/23/2015  ? Neurogenic pain 12/23/2015  ? Neuropathic pain 12/23/2015  ? Diabetic peripheral neuropathy ([place]) 12/23/2015  ? Encounter for therapeutic drug level monitoring 12/23/2015  ? Encounter for pain management planning 12/23/2015  ? GERD (gastroesophageal reflux disease) 11/25/2015  ? Neuropathy 11/25/2015  ? Hypokalemia due to loss of potassium 10/21/2015  ? Hypomagnesemia 10/21/2015  ? QT prolongation 10/21/2015  ? Osteomyelitis due to type 2 diabetes mellitus ([place]) 10/21/2015  ? Chronic wrist pain (Secondary area of Pain) (Bilateral) (L>R) 03/19/2015  ? Adhesive capsulitis 03/19/2015  ? Female genuine stress incontinence 02/14/2015  ? Urge incontinence of urine 02/14/2015  ? OSA (obstructive sleep apnea) 07/03/2014  ? Chronic diastolic CHF (congestive heart failure) ([place]) 07/03/2014  ? Anxiety 01/03/2014  ? Diabetic ulcer of heel ([place]) 01/03/2014  ? COPD (chronic obstructive pulmonary disease) ([place]) 01/03/2014  ? Bipolar disorder, unspecified ([place]) 01/03/2014  ? Diastolic dysfunction 123XX123  ? Combined fat and carbohydrate induced hyperlipemia 01/03/2014  ? Shortness of breath 01/03/2014   ? Acute on chronic congestive heart failure ([place]) 01/03/2014  ? Incomplete bladder emptying 11/02/2012  ? Bladder retention 10/10/2012  ? Detrusor muscle hypertonia 10/04/2012  ? Obstruction of urinary tract 10/04/2012  ? FOM (frequency of micturition) 10/04/2012  ? Mixed incontinence 10/04/2012  ? Vitamin D insufficiency 04/15/2012  ? Borderline personality disorder ([place]) 01/06/2012  ? Anxiety and depression 01/06/2012  ? History of laparoscopic adjustable gastric banding, 03/20/2007.  Removed 09/19/2011. 08/04/2011  ? Nausea & vomiting 08/04/2011  ? Hypothyroidism 06/28/2010  ? Rheumatoid arthritis involving multiple sites with positive rheumatoid factor ([place]) 06/28/2010  ? Obesity 03/29/2005  ? Essential (primary) hypertension 01/27/2005  ? Major depressive disorder, recurrent episode, moderate ([place]) 06/03/2004  ? ? ?Past Surgical History:  ?Procedure Laterality Date  ? ABDOMINAL HYSTERECTOMY    ? CHOLECYSTECTOMY    ? DG GREAT TOE RIGHT FOOT  02/23/2018  ? EYE SURGERY    ? bilateral cataract extraction with IOL  ? FOOT SURGERY Left 05/13/2021  ? [facility]  ? HERNIA REPAIR    ? ventral hernia with strangulation  ?  IRRIGATION AND DEBRIDEMENT FOOT Left 01/11/2020  ? Procedure: IRRIGATION AND DEBRIDEMENT FOOT;  Surgeon: Samara Deist, DPM;  Location: [facility] ORS;  Service: Podiatry;  Laterality: Left;  ? LAPAROSCOPIC GASTRIC BANDING  03/20/2007  ? TEE WITHOUT CARDIOVERSION N/A 07/16/2019  ? Procedure: TRANSESOPHAGEAL ECHOCARDIOGRAM (TEE);  Surgeon: Kate Sable, MD;  Location: [facility] ORS;  Service: Cardiovascular;  Laterality: N/A;  ? TONSILLECTOMY    ? TUBAL LIGATION    ? ? ?OB History   ?No obstetric history on file. ?  ? ? ? ?Home Medications   ? ?Prior to Admission medications   ?Medication Sig Start Date End Date Taking? Authorizing Provider  ?ARIPiprazole (ABILIFY) 5 MG tablet Take 1 tablet (5 mg total) by mouth daily. 08/19/21  Yes Clapacs, Madie [place], MD  ?ascorbic acid (VITAMIN C) 500 MG tablet Take 1 tablet (500 mg  total) by mouth daily. 05/05/19  Yes Lavina Hamman, MD  ?busPIRone (BUSPAR) 10 MG tablet Take 1 tablet (10 mg total) by mouth 2 (two) times daily. 06/09/21  Yes Clapacs, Madie Reno, MD  ?calcium citrate-vitamin

## 2021-08-22 NOTE — ED Triage Notes (Signed)
Patient states that she noticed some redness in her left lower leg on Thursday.  Patient states that her redness as started spreading and has warmth and pain in the area.  Patient reports yellow drainage from the site last night.  ?

## 2021-08-27 MED ORDER — FOLIC ACID 1 MG TABLET
ORAL_TABLET | Freq: Every day | ORAL | 3 refills | 90 days | Status: CP
Start: 2021-08-27 — End: ?

## 2021-09-02 ENCOUNTER — Ambulatory Visit: Admit: 2021-09-02 | Discharge: 2021-09-03 | Payer: PRIVATE HEALTH INSURANCE | Attending: Family | Primary: Family

## 2021-09-02 DIAGNOSIS — E782 Mixed hyperlipidemia: Principal | ICD-10-CM

## 2021-09-02 DIAGNOSIS — L03116 Cellulitis of left lower limb: Principal | ICD-10-CM

## 2021-09-02 DIAGNOSIS — J449 Chronic obstructive pulmonary disease, unspecified: Principal | ICD-10-CM

## 2021-09-02 DIAGNOSIS — Z794 Long term (current) use of insulin: Principal | ICD-10-CM

## 2021-09-02 DIAGNOSIS — A4902 Methicillin resistant Staphylococcus aureus infection, unspecified site: Principal | ICD-10-CM

## 2021-09-02 DIAGNOSIS — E1165 Type 2 diabetes mellitus with hyperglycemia: Principal | ICD-10-CM

## 2021-09-02 DIAGNOSIS — E039 Hypothyroidism, unspecified: Principal | ICD-10-CM

## 2021-09-02 MED ORDER — ANORO ELLIPTA 62.5 MCG-25 MCG/ACTUATION POWDER FOR INHALATION
Freq: Every day | RESPIRATORY_TRACT | 11 refills | 30 days | Status: CP
Start: 2021-09-02 — End: ?

## 2021-09-02 MED ORDER — LINEZOLID 600 MG TABLET
ORAL_TABLET | Freq: Two times a day (BID) | ORAL | 0 refills | 7 days | Status: CP
Start: 2021-09-02 — End: 2021-09-09

## 2021-09-06 IMAGING — CT CT ANGIO CHEST
2 of 6 series · 18 of 46 positions shown · IV contrast (APPLIED)
Comparison: 02/19/2020 and previous

CLINICAL DATA: Shortness of breath, positive D-dimer, recent COVID,
high prob PE

EXAM:
CT ANGIOGRAPHY CHEST WITH CONTRAST
TECHNIQUE: Multidetector CT imaging of the chest was performed using the
standard protocol during bolus administration of intravenous
contrast. Multiplanar CT image reconstructions and MIPs were
obtained to evaluate the vascular anatomy.
CONTRAST:  100mL OMNIPAQUE IOHEXOL 350 MG/ML SOLN

[Series 5: thins · axial · 0.79mm/px · z∈[-576,-326]mm · 15 of 273 slices shown]
[im 12/273  lung]
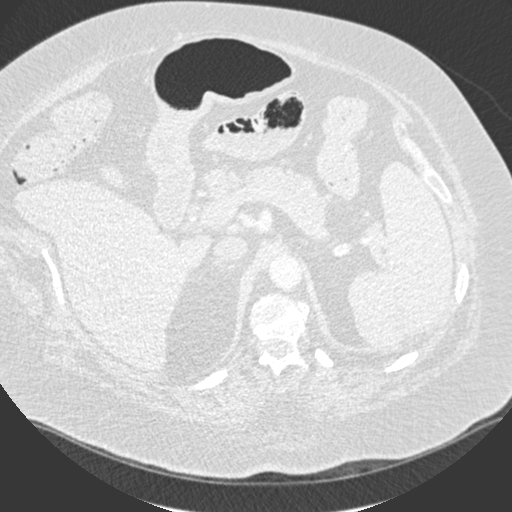
[im 36/273  soft-tissue]
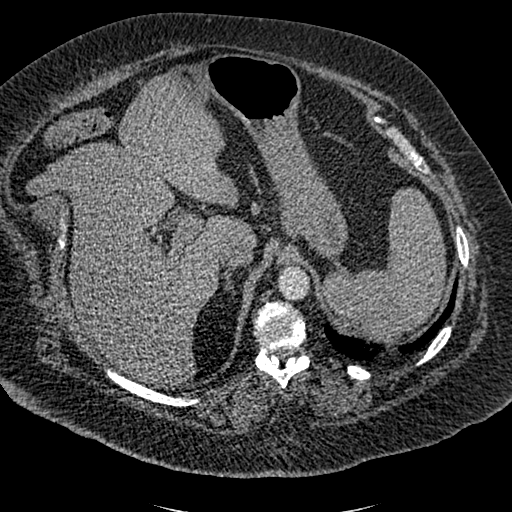
[im 48/273  lung]
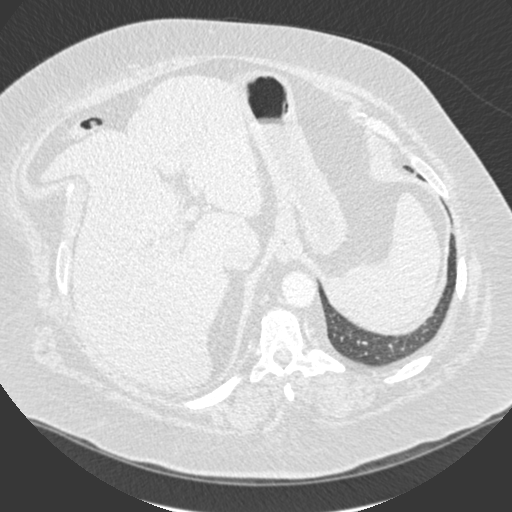
[im 71/273  soft-tissue]
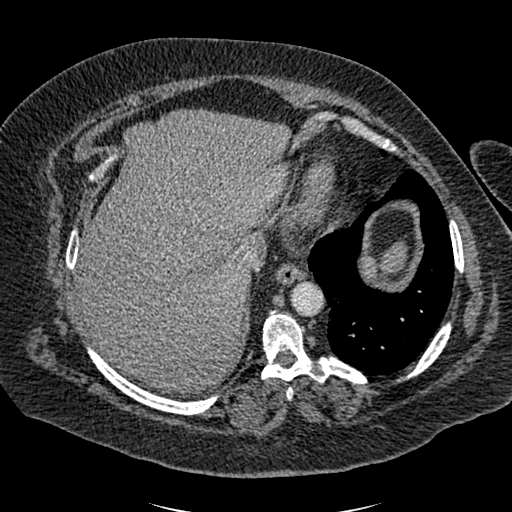
[im 83/273  lung]
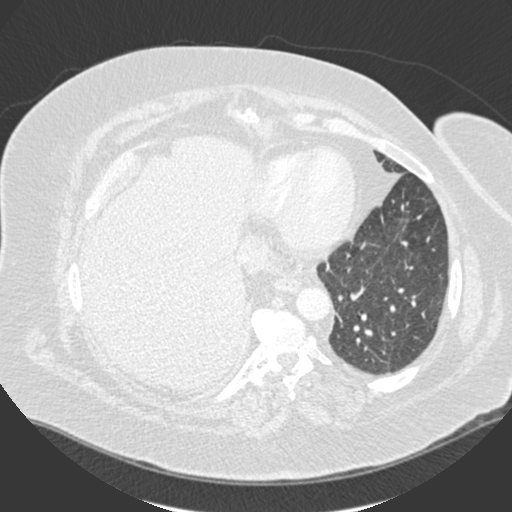
[im 107/273  soft-tissue]
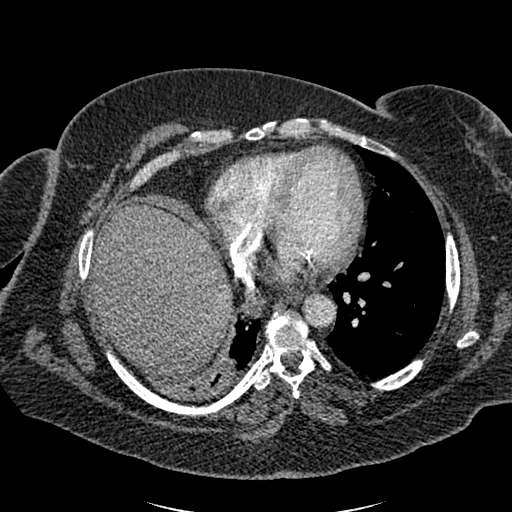
[im 119/273  lung]
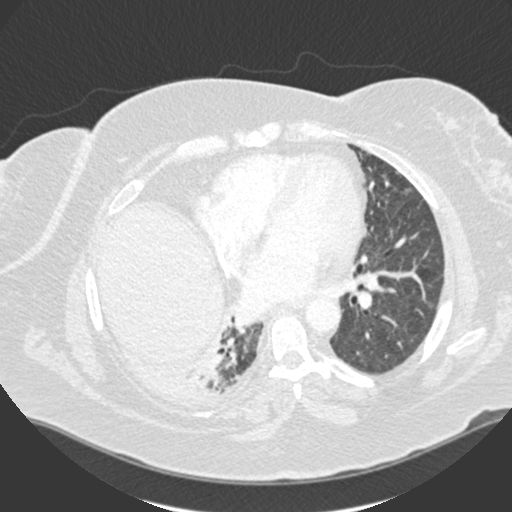
[im 142/273  soft-tissue]
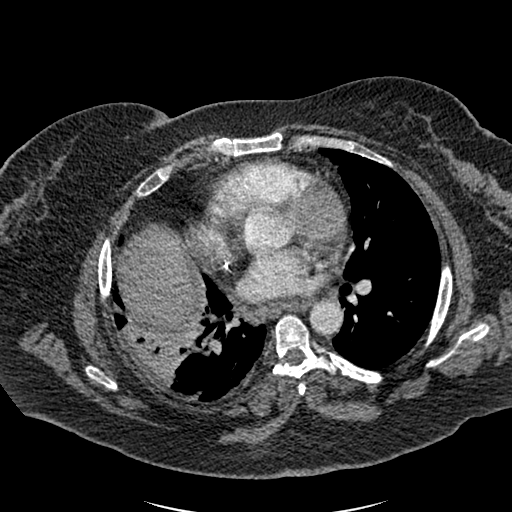
[im 154/273  lung]
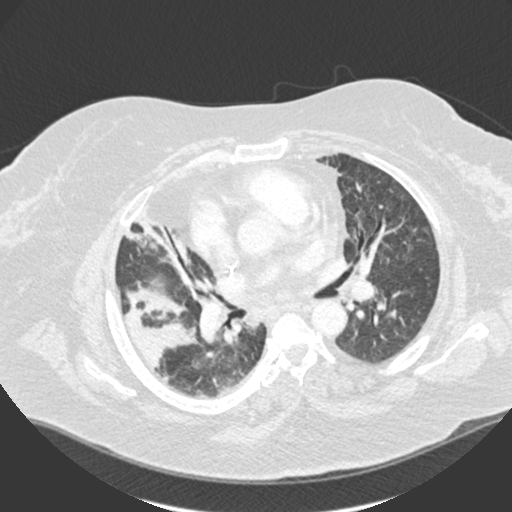
[im 166/273  soft-tissue]
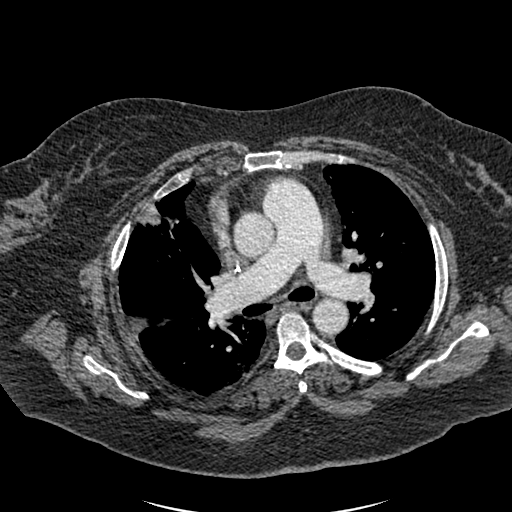
[im 190/273  lung]
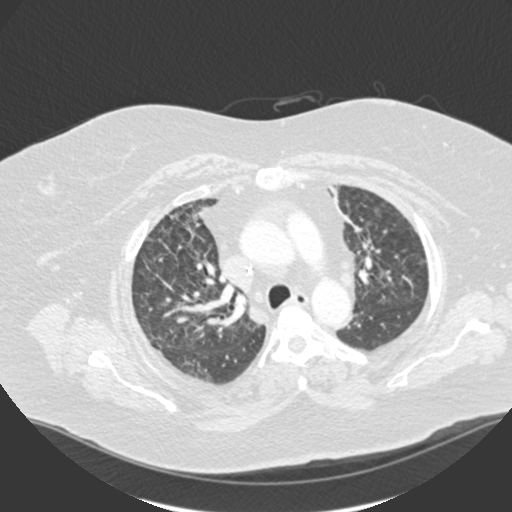
[im 202/273  soft-tissue]
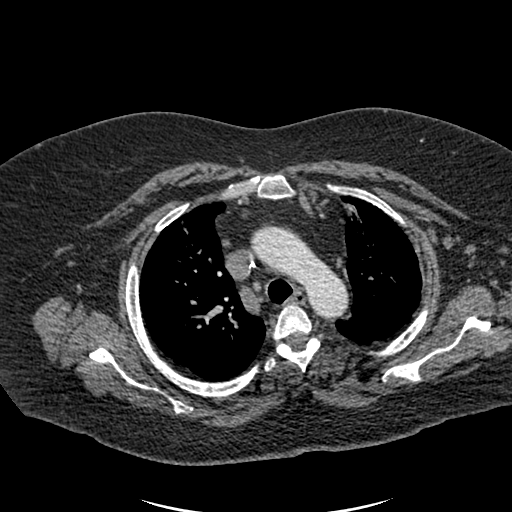
[im 225/273  lung]
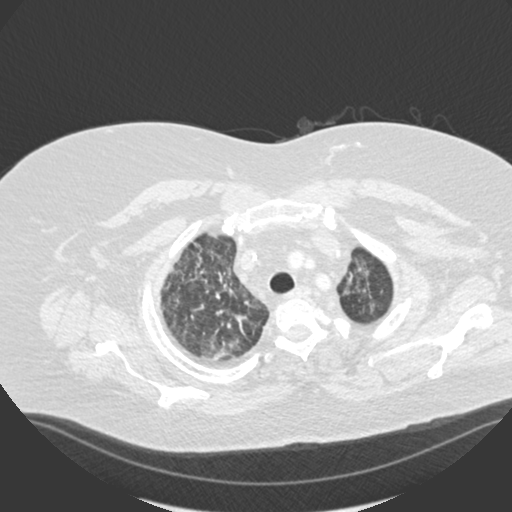
[im 237/273  soft-tissue]
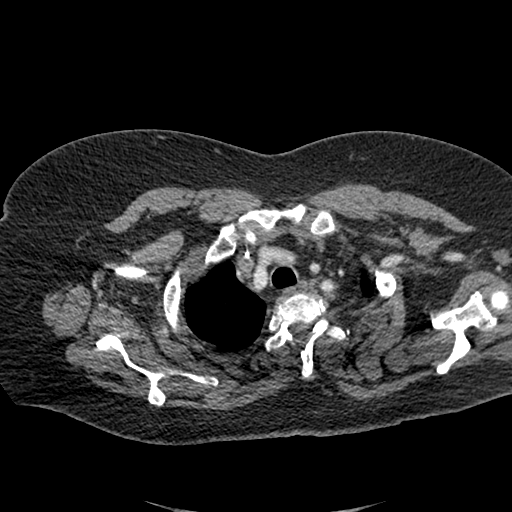
[im 261/273  lung]
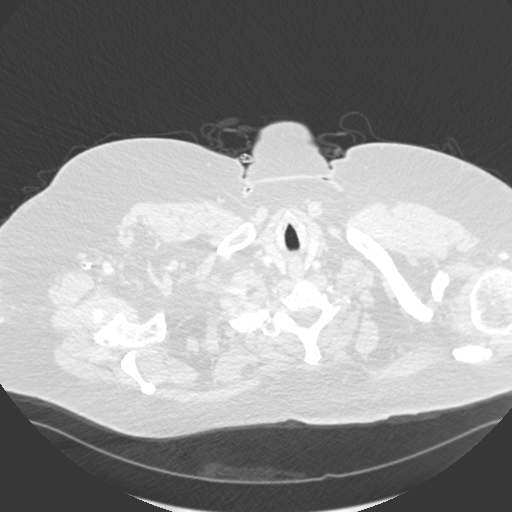

[Series 7: coronal mpr · coronal · 0.53mm/px · 3 of 106 slices shown]
[im 27/106  soft-tissue]
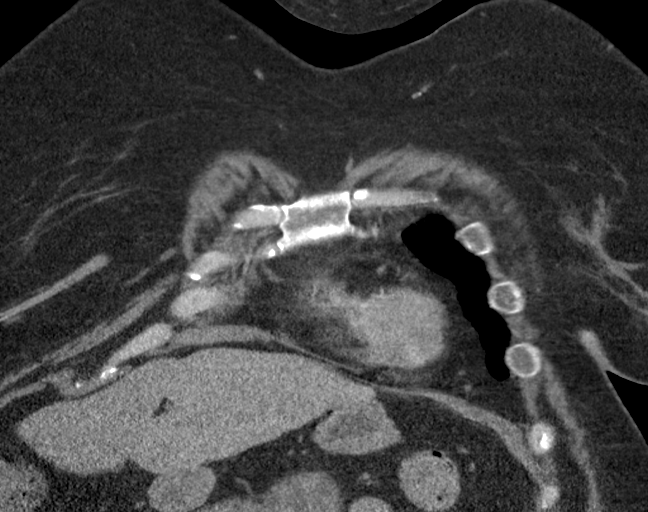
[im 53/106  soft-tissue]
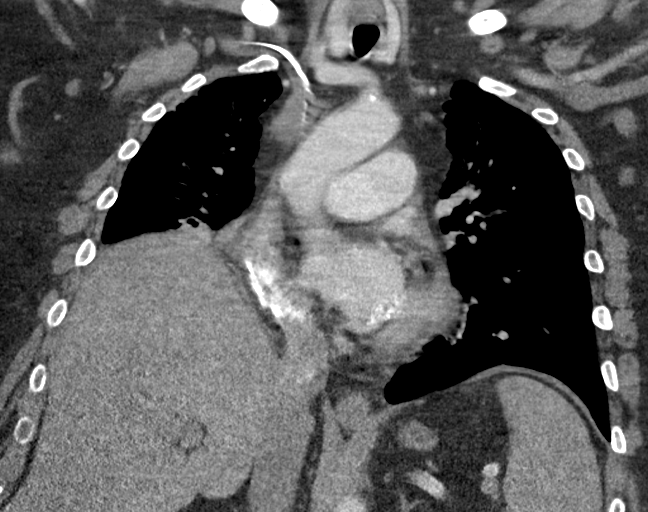
[im 79/106  soft-tissue]
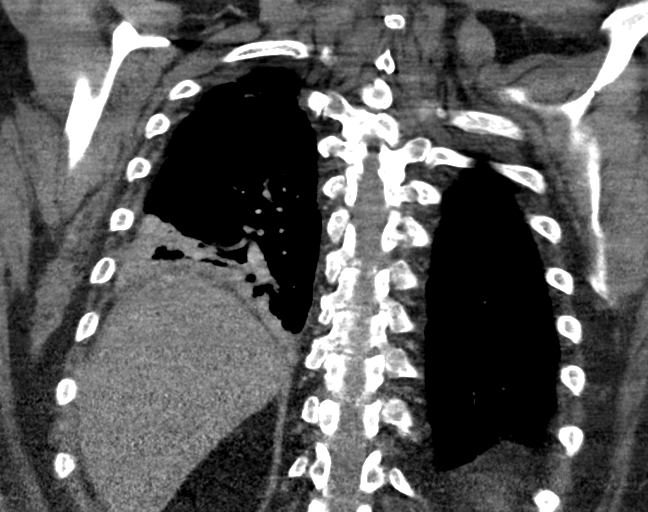

[18 of 46 positions shown; findings below may reference images not displayed]

FINDINGS: Cardiovascular: Right arm central venous catheter extends to the
cavoatrial junction. Heart size normal. Trace pericardial fluid. The
RV is nondilated. Fair contrast opacification of pulmonary arterial
branches. No convincing filling defects to suggest acute PE.
Adequate contrast opacification of the thoracic aorta with no
evidence of dissection, aneurysm, or stenosis. There is classic
3-vessel brachiocephalic arch anatomy without proximal stenosis.

Mediastinum/Nodes: No mass or adenopathy.

Lungs/Pleura: No pleural effusion. No pneumothorax. New
consolidation/atelectasis in the lateral basal and posterior basal
segments right lower lobe. Chronic coarse linear scarring or
atelectasis in both upper lobes peripherally.

Upper Abdomen: Surgical clips around the GE junction.
Cholecystectomy clips. No acute findings.

Musculoskeletal: No chest wall abnormality. No acute or significant
osseous findings.

Review of the MIP images confirms the above findings.
IMPRESSION: 1. Negative for acute PE or thoracic aortic dissection.
2. Consolidation/atelectasis in the lateral basal and posterior
basal segments of the right lower lobe, new since 02/19/2020.

## 2021-09-18 DIAGNOSIS — R110 Nausea: Principal | ICD-10-CM

## 2021-09-18 MED ORDER — PROMETHAZINE 25 MG TABLET
ORAL_TABLET | Freq: Four times a day (QID) | ORAL | 0 refills | 10 days | PRN
Start: 2021-09-18 — End: 2021-09-28

## 2021-09-22 MED ORDER — PROMETHAZINE 25 MG TABLET
ORAL_TABLET | Freq: Four times a day (QID) | ORAL | 0 refills | 10 days | Status: CP | PRN
Start: 2021-09-22 — End: 2021-10-02

## 2021-09-24 ENCOUNTER — Ambulatory Visit
Payer: Medicaid Other | Attending: Student in an Organized Health Care Education/Training Program | Admitting: Student in an Organized Health Care Education/Training Program

## 2021-09-24 ENCOUNTER — Encounter: Payer: Self-pay | Admitting: Student in an Organized Health Care Education/Training Program

## 2021-09-24 VITALS — BP 114/70 | HR 63 | Temp 97.2°F | Resp 18 | Ht 66.0 in | Wt 316.0 lb

## 2021-09-24 DIAGNOSIS — M05711 Rheumatoid arthritis with rheumatoid factor of right shoulder without organ or systems involvement: Secondary | ICD-10-CM | POA: Insufficient documentation

## 2021-09-24 DIAGNOSIS — M05712 Rheumatoid arthritis with rheumatoid factor of left shoulder without organ or systems involvement: Secondary | ICD-10-CM | POA: Diagnosis present

## 2021-09-24 DIAGNOSIS — G8929 Other chronic pain: Secondary | ICD-10-CM | POA: Diagnosis present

## 2021-09-24 DIAGNOSIS — M79673 Pain in unspecified foot: Secondary | ICD-10-CM | POA: Insufficient documentation

## 2021-09-24 DIAGNOSIS — M792 Neuralgia and neuritis, unspecified: Secondary | ICD-10-CM | POA: Diagnosis not present

## 2021-09-24 DIAGNOSIS — M059 Rheumatoid arthritis with rheumatoid factor, unspecified: Secondary | ICD-10-CM | POA: Diagnosis not present

## 2021-09-24 DIAGNOSIS — E1142 Type 2 diabetes mellitus with diabetic polyneuropathy: Secondary | ICD-10-CM | POA: Diagnosis not present

## 2021-09-24 DIAGNOSIS — G894 Chronic pain syndrome: Secondary | ICD-10-CM | POA: Insufficient documentation

## 2021-09-24 MED ORDER — OXYCODONE-ACETAMINOPHEN 5-325 MG PO TABS: 1.0000 | ORAL_TABLET | Freq: Three times a day (TID) | ORAL | 0 refills | Status: AC | PRN

## 2021-09-24 MED ORDER — PREGABALIN 100 MG PO CAPS: 100.0000 mg | ORAL_CAPSULE | Freq: Three times a day (TID) | ORAL | 5 refills | Status: DC

## 2021-09-24 MED ORDER — OXYCODONE-ACETAMINOPHEN 5-325 MG PO TABS: 1.0000 | ORAL_TABLET | Freq: Three times a day (TID) | ORAL | 0 refills | Status: DC | PRN

## 2021-09-24 NOTE — Progress Notes (Signed)
Nursing Pain Medication Assessment:  Safety precautions to be maintained throughout the outpatient stay will include: orient to surroundings, keep bed in low position, maintain call bell within reach at all times, provide assistance with transfer out of bed and ambulation.  Medication Inspection Compliance: Ms. Shortall did not comply with our request to bring her pills to be counted. She was reminded that bringing the medication bottles, even when empty, is a requirement.  Medication: None brought in. Pill/Patch Count: None available to be counted. Bottle Appearance: No container available. Did not bring bottle(s) to appointment. Filled Date: N/A Last Medication intake:  Yesterday

## 2021-09-24 NOTE — Progress Notes (Signed)
PROVIDER NOTE: Information contained herein reflects review and annotations entered in association with encounter. Interpretation of such information and data should be left to medically-trained personnel. Information provided to patient can be located elsewhere in the medical record under "Patient Instructions". Document created using STT-dictation technology, any transcriptional errors that may result from process are unintentional.    Patient: [name] [name]  Service Category: E/M  Provider: Gillis Santa, MD  DOB: 02/01/1961  DOS: 09/24/2021  Specialty: Interventional Pain Management  MRN: [id number]  Setting: Ambulatory outpatient  PCP: Care, [place] [facility]  Type: Established Patient    Referring Provider: Care, [place] [facility]  Location: Office  Delivery: Face-to-face     HPI  [name] [name], a 61 y.o. year old female, is here today because of her Diabetic peripheral neuropathy ([place]) [E11.42]. Ms. Lykens primary complain today is low back and bilateral leg pain Last encounter: My last encounter with her was on 06/30/21 Pertinent problems: [name] [name] has Chronic wrist pain (Secondary area of Pain) (Bilateral) (L>R); Chronic knee pain (Bilateral) (R>L); Long term current use of opiate analgesic; Opiate use (30 MME/Day); Neurogenic pain; Neuropathic pain; Diabetic peripheral neuropathy ([place]); Osteoarthritis, multiple sites; Chronic foot pain (Primary Area of Pain) (Bilateral) (L>R); Chronic elbow pain (Third area of Pain) (Bilateral) (L>R); Chronic upper back pain; Chronic pain syndrome; Rheumatoid arthritis involving multiple sites with positive rheumatoid factor ([place]); CKD (chronic kidney disease), stage III ([place]); Morbid obesity with BMI of 50.0-59.9, adult ([place]); and Severe sepsis with septic shock ([place]) on their pertinent problem list. Pain Assessment: Severity of Chronic pain is reported as a 2 /10. Location: Hand Right, Left/radiates to wrist. Onset: More than a month ago. Quality: Sharp.  Timing: Constant. Modifying factor(s): medicine. Vitals:  height is 5' 6"  (1.676 m) and weight is 316 lb (143.3 kg) (abnormal). Her temperature is 97.2 F (36.2 C) (abnormal). Her blood pressure is 114/70 and her pulse is 63. Her respiration is 18 and oxygen saturation is 96%.   Reason for encounter: medication management.    Patient presents today for medication management.  She did have a fall last week when she tripped and fell in her kitchen.  She is recovering from her fall. I will refill her oxycodone as below for chronic pain management.  She continues on Lyrica 100 mg 3 times a day.  I have cautioned her on her Ambien prescription.   Pharmacotherapy Assessment  Analgesic: Percocet 5 mg TID PRN    Monitoring: [place] PMP: PDMP reviewed during this encounter.       Pharmacotherapy: No side-effects or adverse reactions reported. Compliance: No problems identified. Effectiveness: Clinically acceptable.  UDS:  Summary  Date Value Ref Range Status  09/16/2020 Note  Final    Comment:    ==================================================================== [employer] Select 13 (MW) ==================================================================== Test                             Result       Flag       Units  Drug Present and Declared for Prescription Verification   Oxycodone                      615          EXPECTED   ng/mg creat   Noroxycodone                   587  EXPECTED   ng/mg creat    Sources of oxycodone include scheduled prescription medications.    Noroxycodone is an expected metabolite of oxycodone.  Drug Absent but Declared for Prescription Verification   Alprazolam                     Not Detected UNEXPECTED ng/mg creat ==================================================================== Test                      Result    Flag   Units      Ref Range   Creatinine              139              mg/dL       >=20 ==================================================================== Declared Medications:  The flagging and interpretation on this report are based on the  following declared medications.  Unexpected results may arise from  inaccuracies in the declared medications.   **Note: The testing scope of this panel includes these medications:   Alprazolam (Xanax)  Oxycodone (Percocet)   **Note: The testing scope of this panel does not include the  following reported medications:   Acetaminophen (Tylenol)  Acetaminophen (Percocet)  Albuterol (Duoneb)  Apixaban (Eliquis)  Aripiprazole (Abilify)  Atorvastatin  Budesonide (Pulmicort)  Calcium  Cholecalciferol  Fluoxetine (Prozac)  Folic Acid  Furosemide  Insulin (Humalog)  Ipratropium (Duoneb)  Levothyroxine  Linagliptin (Tradjenta)  Metformin  Mupirocin  Polyethylene Glycol (MiraLAX)  Pramipexole (Mirapex)  Pregabalin (Lyrica)  Quetiapine (Seroquel)  Solifenacin (Vesicare)  Vancomycin  Vitamin C  Vitamin D  Zinc  Zolpidem (Ambien) ==================================================================== For clinical consultation, please call [phone]. ====================================================================       ROS  Constitutional:  Intermittent shortness of breath Gastrointestinal: No reported hemesis, hematochezia, vomiting, or acute GI distress Musculoskeletal:  Low back pain, left ankle pain, bilateral shoulder pain right greater than left Neurological: No reported episodes of acute onset apraxia, aphasia, dysarthria, agnosia, amnesia, paralysis, loss of coordination, or loss of consciousness  Medication Review  ALPRAZolam, ARIPiprazole, FLUoxetine, Insulin Degludec, QUEtiapine, Tiotropium Bromide-Olodaterol, Vitamin D, acetaminophen, albuterol, apixaban, ascorbic acid, busPIRone, calcium citrate-vitamin D, famotidine, folic acid, furosemide, hydroxychloroquine, levothyroxine, linagliptin,  metFORMIN, methotrexate, metoprolol tartrate, mupirocin ointment, oxyCODONE-acetaminophen, polyethylene glycol, pramipexole, pregabalin, solifenacin, zinc sulfate, and zolpidem  History Review  Allergy: [name] [name] is allergic to cephalexin, codeine, doxycycline, propoxyphene, sulfa antibiotics, clindamycin, lovenox [enoxaparin sodium], hydrocodone, and meropenem. Drug: Ms. Egner  reports no history of drug use. Alcohol:  reports no history of alcohol use. Tobacco:  reports that she quit smoking about 22 years ago. Her smoking use included cigarettes. She has a 54.00 pack-year smoking history. She has never used smokeless tobacco. Social: Ms. Ramaker  reports that she quit smoking about 22 years ago. Her smoking use included cigarettes. She has a 54.00 pack-year smoking history. She has never used smokeless tobacco. She reports that she does not drink alcohol and does not use drugs. Medical:  has a past medical history of Abdominal wall hernia (01/29/2013), Anxiety, Arthritis, C. difficile colitis, Chronic diastolic heart failure ([place]), COVID-19 (03/23/2019), Depression, Diabetes mellitus, Diastolic CHF ([place]), Esophagitis, Fluid retention, GERD (gastroesophageal reflux disease), Hiatal hernia, Hypertension, Hypokalemia due to loss of potassium (10/21/2015), Hypothyroidism, IBS (irritable bowel syndrome), Moderate episode of recurrent major depressive disorder ([place]) (06/03/2004), Morbid obesity ([place]), MRSA (methicillin resistant Staphylococcus aureus) infection (11/2017), Neurogenic bladder, Neuropathy, Obesity, Panic attacks, Pneumonia due to COVID-19 virus, Rheumatoid arthritis ([place]), and Sleep apnea. Surgical: Ms.  Hessler  has a past surgical history that includes Tubal ligation; Tonsillectomy; Cholecystectomy; Abdominal hysterectomy; Laparoscopic gastric banding (03/20/2007); Eye surgery; Hernia repair; DG GREAT TOE RIGHT FOOT (02/23/2018); TEE without cardioversion (N/A, 07/16/2019); Irrigation and debridement  foot (Left, 01/11/2020); and Foot surgery (Left, 05/13/2021). Family: family history includes Alcohol abuse in her father and sister; Anxiety disorder in her father, sister, and sister; Bipolar disorder in her father and sister; Depression in her father, sister, and sister; Drug abuse in her sister; Heart attack in her brother; Heart attack (age of onset: 49) in her brother; Heart disease in her brother; Heart failure in her father.  Laboratory Chemistry Profile   Renal Lab Results  Component Value Date   BUN 15 07/29/2020   CREATININE 0.83 07/29/2020   BCR 16 11/16/2018   GFRAA >60 01/15/2020   GFRNONAA >60 07/29/2020     Hepatic Lab Results  Component Value Date   AST 32 04/28/2020   ALT 31 04/28/2020   ALBUMIN 3.3 (L) 05/21/2020   ALKPHOS 70 04/28/2020   LIPASE 176 (H) 07/13/2019     Electrolytes Lab Results  Component Value Date   NA 138 07/29/2020   K 3.7 07/29/2020   CL 100 07/29/2020   CALCIUM 9.2 07/29/2020   MG 1.7 01/15/2020   PHOS 2.8 05/21/2020     Bone Lab Results  Component Value Date   VD25OH 37.36 04/30/2019   25OHVITD1 23 (L) 11/16/2018   25OHVITD2 2.2 11/16/2018   25OHVITD3 21 11/16/2018     Inflammation (CRP: Acute Phase) (ESR: Chronic Phase) Lab Results  Component Value Date   CRP 2.1 (H) 07/17/2019   ESRSEDRATE 65 (H) 07/17/2019   LATICACIDVEN 1.4 04/28/2020       Note: Above Lab results reviewed.  Physical Exam  General appearance: Well nourished, well developed, and well hydrated. In no apparent acute distress Mental status: Alert, oriented x 3 (person, place, & time)       Respiratory: Oxygen-dependent COPD,  Cervical Spine Area Exam  Skin & Axial Inspection: No masses, redness, edema, swelling, or associated skin lesions Alignment: Symmetrical Functional ROM: Pain restricted ROM      Stability: No instability detected Muscle Tone/Strength: Functionally intact. No obvious neuro-muscular anomalies detected. Sensory (Neurological):  Arthropathic arthralgia Palpation: No palpable anomalies             Upper Extremity (UE) Exam    Side: Right upper extremity  Side: Left upper extremity  Skin & Extremity Inspection: Skin color, temperature, and hair growth are WNL. No peripheral edema or cyanosis. No masses, redness, swelling, asymmetry, or associated skin lesions. No contractures.  Skin & Extremity Inspection: Skin color, temperature, and hair growth are WNL. No peripheral edema or cyanosis. No masses, redness, swelling, asymmetry, or associated skin lesions. No contractures.  Functional ROM: Pain restricted ROM for shoulder and elbow  Functional ROM: Unrestricted ROM          Muscle Tone/Strength: Functionally intact. No obvious neuro-muscular anomalies detected.  Muscle Tone/Strength: Functionally intact. No obvious neuro-muscular anomalies detected.  Sensory (Neurological): Arthropathic arthralgia          Sensory (Neurological): Unimpaired          Palpation: No palpable anomalies              Palpation: No palpable anomalies              Provocative Test(s):  Phalen's test: deferred Tinel's test: deferred Apley's scratch test (touch opposite shoulder):  Action 1 (Across  chest): Decreased ROM Action 2 (Overhead): Decreased ROM Action 3 (LB reach): Decreased ROM   Provocative Test(s):  Phalen's test: deferred Tinel's test: deferred Apley's scratch test (touch opposite shoulder):  Action 1 (Across chest): deferred Action 2 (Overhead): deferred Action 3 (LB reach): deferred    Vitals: BP 114/70   Pulse 63   Temp (!) 97.2 F (36.2 C)   Resp 18   Ht 5' 6"  (1.676 m)   Wt (!) 316 lb (143.3 kg)   LMP 04/20/2001   SpO2 96%   BMI 51.00 kg/m  BMI: Estimated body mass index is 51 kg/m as calculated from the following:   Height as of this encounter: 5' 6"  (1.676 m).   Weight as of this encounter: 316 lb (143.3 kg). Ideal: Ideal body weight: 59.3 kg (130 lb 11.7 oz) Adjusted ideal body weight: 92.9 kg (204 lb 13.4  oz)     Assessment   Diagnosis  1. Diabetic peripheral neuropathy ([place])   2. Neuropathic pain   3. Neurogenic pain   4. Chronic foot pain (Primary Area of Pain) (Bilateral) (L>R)   5. Rheumatoid arthritis with positive rheumatoid factor, involving unspecified site ([place])   6. Rheumatoid arthritis involving both shoulders with positive rheumatoid factor ([facility])   7. Chronic pain syndrome          Plan of Care  Ms. SHARONICA KRASZEWSKI has a current medication list which includes the following long-term medication(s): albuterol, aripiprazole, calcium citrate-vitamin d, eliquis, famotidine, fluoxetine, furosemide, quetiapine, zolpidem, fluoxetine, pregabalin, quetiapine, and zolpidem.    Pharmacotherapy (Medications Ordered): Meds ordered this encounter  Medications   oxyCODONE-acetaminophen (PERCOCET) 5-325 MG tablet    Sig: Take 1 tablet by mouth every 8 (eight) hours as needed for severe pain. Must last 30 days.    Dispense:  90 tablet    Refill:  0    Chronic Pain: STOP Act (Not applicable) Fill 1 day early if closed on refill date. Avoid benzodiazepines within 8 hours of opioids   oxyCODONE-acetaminophen (PERCOCET) 5-325 MG tablet    Sig: Take 1 tablet by mouth every 8 (eight) hours as needed for severe pain. Must last 30 days.    Dispense:  90 tablet    Refill:  0    Chronic Pain: STOP Act (Not applicable) Fill 1 day early if closed on refill date. Avoid benzodiazepines within 8 hours of opioids   pregabalin (LYRICA) 100 MG capsule    Sig: Take 1 capsule (100 mg total) by mouth 3 (three) times daily.    Dispense:  90 capsule    Refill:  5    Fill one day early if pharmacy is closed on scheduled refill date. May substitute for generic if available.     No orders of the defined types were placed in this encounter.  We tried to proceed with Qutenza, capsaicin topical treatment for painful diabetic neuropathy however insurance would not cover.  Follow-up plan:   Return in  about 3 months (around 12/22/2021) for Medication Management, in person.   Recent Visits Date Type Provider Dept  06/30/21 Office Visit Gillis Santa, MD [facility]  Showing recent visits within past 90 days and meeting all other requirements Today's Visits Date Type Provider Dept  09/24/21 Office Visit Gillis Santa, MD [facility]  Showing today's visits and meeting all other requirements Future Appointments Date Type Provider Dept  12/17/21 Appointment Gillis Santa, MD [facility]  Showing future appointments within next 90 days and  meeting all other requirements  I discussed the assessment and treatment plan with the patient. The patient was provided an opportunity to ask questions and all were answered. The patient agreed with the plan and demonstrated an understanding of the instructions.  Patient advised to call back or seek an in-person evaluation if the symptoms or condition worsens.  Duration of encounter: 30 minutes.  Note by: Gillis Santa, MD Date: 09/24/2021; Time: 11:39 AM

## 2021-09-29 ENCOUNTER — Ambulatory Visit
Admit: 2021-09-29 | Payer: PRIVATE HEALTH INSURANCE | Attending: Student in an Organized Health Care Education/Training Program | Primary: Student in an Organized Health Care Education/Training Program

## 2021-10-01 ENCOUNTER — Encounter: Payer: Self-pay | Admitting: Pulmonary Disease

## 2021-10-01 ENCOUNTER — Ambulatory Visit (INDEPENDENT_AMBULATORY_CARE_PROVIDER_SITE_OTHER): Payer: Medicaid Other | Admitting: Pulmonary Disease

## 2021-10-01 VITALS — BP 132/76 | HR 67 | Temp 97.6°F | Ht 66.0 in | Wt 319.0 lb

## 2021-10-01 DIAGNOSIS — R0602 Shortness of breath: Secondary | ICD-10-CM

## 2021-10-01 DIAGNOSIS — J4489 Other specified chronic obstructive pulmonary disease: Secondary | ICD-10-CM

## 2021-10-01 DIAGNOSIS — J9611 Chronic respiratory failure with hypoxia: Secondary | ICD-10-CM | POA: Diagnosis not present

## 2021-10-01 DIAGNOSIS — J449 Chronic obstructive pulmonary disease, unspecified: Secondary | ICD-10-CM | POA: Diagnosis not present

## 2021-10-01 DIAGNOSIS — J8410 Pulmonary fibrosis, unspecified: Secondary | ICD-10-CM | POA: Diagnosis not present

## 2021-10-01 DIAGNOSIS — Z6841 Body Mass Index (BMI) 40.0 and over, adult: Secondary | ICD-10-CM

## 2021-10-01 MED ORDER — BREZTRI AEROSPHERE 160-9-4.8 MCG/ACT IN AERO: 2.0000 | INHALATION_SPRAY | Freq: Two times a day (BID) | RESPIRATORY_TRACT | 0 refills | Status: DC

## 2021-10-01 NOTE — Patient Instructions (Signed)
We have rescheduled the echocardiogram.  We are scheduling some breathing tests.  DO NOT USE THE ANORO NOR THE STIOLTO.  We have given you a trial of a new inhaler called Breztri this is 2 puffs twice a day make sure you rinse your mouth well after you use it.  We will see you in follow-up in 6 to 8 weeks time call sooner should any new problems arise.  Let us know how you are doing with the Markus Daft so we can call the prescription in to your pharmacy.

## 2021-10-01 NOTE — Progress Notes (Signed)
Subjective:    Patient ID: [name] [name], female    DOB: 1960/04/30, 61 y.o.   MRN: [id number] Patient Care Team: [facility] as PCP - General (Family Medicine)  Chief Complaint  Patient presents with   Follow-up    SOB with exertion and wheezing.    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  Patient also has extreme obesity with obesity hypoventilation.  This is a scheduled visit.  Patient was last seen on 25 June 2021 at that time a 2D echo was ordered to evaluate for potential cardiac causes of dyspnea.  Per, the patient did not have this done she canceled the test due to not feeling well on the day of the test and did not reschedule.  Today she presents stating that she has worsening shortness of breath last several months.  She has not had any chest pain.  She went to see her primary care practitioner and was given Anoro which she is taking along with the Stiolto, I instructed that these medications are actually similar and should not be taken together.  She is compliant with the oxygen supplementation and does note that this helps her.  She has had increased wheezing.  No fevers, chills or sweats.  No cough or sputum production.  Lower extremity edema stable.  Review of Systems A 10 point review of systems was performed and it is as noted above otherwise negative.  Patient Active Problem List   Diagnosis Date Noted   DVT (deep vein thrombosis) in pregnancy 05/27/2020   DVT of popliteal vein (HCC) 05/27/2020   Postinflammatory pulmonary fibrosis (HCC) 02/07/2020   Chronic respiratory failure with hypoxia (HCC) 02/07/2020   Anemia 02/07/2020   Lower extremity cellulitis 01/10/2020   Bacteremia    Paroxysmal A-fib (HCC)    Right arm weakness     Ileus (HCC)    Splenic infarct    Acute metabolic encephalopathy 07/08/2019   Hyperglycemia due to type 2 diabetes mellitus (HCC) 07/08/2019   Morbid obesity with BMI of 50.0-59.9, adult (HCC) 07/08/2019   Severe sepsis with septic shock (HCC) 07/08/2019   Septic shock (HCC) 07/08/2019   History of severe acute respiratory syndrome coronavirus 2 (SARS-CoV-2) disease 05/24/2019   Acute respiratory disease due to COVID-19 virus 04/06/2019   Type 2 diabetes mellitus without complication, without long-term current use of insulin (HCC) 04/06/2019   CKD (chronic kidney disease), stage III (HCC) 04/06/2019   HLD (hyperlipidemia) 04/06/2019   COVID-19 03/27/2019   Pneumonia due to COVID-19 virus 03/27/2019   Abdominal pain 02/12/2019   AKI (acute kidney injury) (HCC) 02/12/2019   Cellulitis, abdominal wall 02/04/2019   Elevated C-reactive protein (CRP) 12/15/2018   Elevated sed rate 12/15/2018   Pharmacologic therapy 11/06/2018   Disorder of skeletal system 11/06/2018   Problems influencing health status 11/06/2018   Decubitus ulcer of heel, bilateral 02/21/2018   Diabetic ulcer of toe of right foot associated with type 2 diabetes mellitus (HCC) 02/14/2018   Ulcer of left heel and midfoot with fat layer exposed (HCC) 02/14/2018   Sepsis due to methicillin resistant Staphylococcus aureus (MRSA) without acute organ dysfunction (HCC) 11/17/2017   MRSA (methicillin resistant Staphylococcus aureus) infection 11/17/2017   Cellulitis 11/15/2017   Perineal abscess 11/15/2017   Subacute vulvitis 11/01/2017  Chronic cystitis 03/01/2017   Chronic pain syndrome 03/31/2016   Insomnia secondary to chronic pain 03/31/2016   Chronic upper back pain 12/25/2015   Chronic hand pain (Bilateral) (L>R) 12/25/2015   Rheumatoid arthritis (HCC) 12/25/2015   Osteoarthritis, multiple sites 12/24/2015   Chronic foot pain (Primary Area of Pain) (Bilateral) (L>R) 12/24/2015   Chronic elbow pain (Third area of  Pain) (Bilateral) (L>R) 12/24/2015   Chronic shoulder pain (Bilateral) (L>R) 12/24/2015   Chronic neck pain (Bilateral) (R>L) 12/24/2015   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[employer]) 12/23/2015   Chronic knee pain (Bilateral) (R>L) 12/23/2015   Long term current use of opiate analgesic 12/23/2015   Long term prescription opiate use 12/23/2015   Opiate use (30 MME/Day) 12/23/2015   Neurogenic pain 12/23/2015   Neuropathic pain 12/23/2015   Diabetic peripheral neuropathy (HCC) 12/23/2015   Encounter for therapeutic drug level monitoring 12/23/2015   Encounter for pain management planning 12/23/2015   GERD (gastroesophageal reflux disease) 11/25/2015   Neuropathy 11/25/2015   Hypokalemia due to loss of potassium 10/21/2015   Hypomagnesemia 10/21/2015   QT prolongation 10/21/2015   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) 10/21/2015   Chronic wrist pain (Secondary area of Pain) (Bilateral) (L>R) 03/19/2015   Adhesive capsulitis 03/19/2015   Female genuine stress incontinence 02/14/2015   Urge incontinence of urine 02/14/2015   OSA (obstructive sleep apnea) 07/03/2014   Chronic diastolic CHF (congestive heart failure) (HCC) 07/03/2014   Anxiety 01/03/2014   Diabetic ulcer of heel (HCC) 01/03/2014   COPD (chronic obstructive pulmonary disease) (HCC) 01/03/2014   Bipolar disorder, unspecified (HCC) 01/03/2014   Diastolic dysfunction 01/03/2014   Combined fat and carbohydrate induced hyperlipemia 01/03/2014   Shortness of breath 01/03/2014   Acute on chronic congestive heart failure (HCC) 01/03/2014   Incomplete bladder emptying 11/02/2012   Bladder retention 10/10/2012   Detrusor muscle hypertonia 10/04/2012   Obstruction of urinary tract 10/04/2012   FOM (frequency of micturition) 10/04/2012   Mixed incontinence 10/04/2012   Vitamin D insufficiency 04/15/2012   Borderline personality disorder (HCC) 01/06/2012   Anxiety and depression 01/06/2012    History of laparoscopic adjustable gastric banding, 03/20/2007.  Removed 09/19/2011. 08/04/2011   Nausea & vomiting 08/04/2011   Hypothyroidism 06/28/2010   Rheumatoid arthritis involving multiple sites with positive rheumatoid factor (HCC) 06/28/2010   Obesity 03/29/2005   Essential (primary) hypertension 01/27/2005   Major depressive disorder, recurrent episode, moderate (HCC) 06/03/2004   Social History   Tobacco Use   Smoking status: Former    Packs/day: 2.00    Years: 27.00    Total pack years: 54.00    Types: Cigarettes    Quit date: 07/30/1999    Years since quitting: 22.1   Smokeless tobacco: Never   Tobacco comments:    quit in 2001 2-2.5 a day  Substance Use Topics   Alcohol use: No   Allergies  Allergen Reactions   Cephalexin Hives   Codeine Palpitations, Nausea Only, Nausea And Vomiting, Rash and Shortness Of Breath    "makes heart fly, she gets flushed and passes out"   Doxycycline Rash   Propoxyphene Rash and Shortness Of Breath    Increase heart rate   Sulfa Antibiotics Palpitations, Nausea Only, Shortness Of Breath and Hives    "makes heart fly, she gets flushed and passes out"   Clindamycin Rash    Tongue swelling, oral sores, and Mouth rash   Lovenox [Enoxaparin Sodium] Hives   Hydrocodone Nausea And Vomiting  Hear racing & breaks out into a cold sweat.   Meropenem Rash    Erythematous, hot, pruritic rash over arms, chest, back, abdomen, and face occurred at the end of meropenem infusion on 02/22/18   Current Meds  Medication Sig   acetaminophen (TYLENOL) 325 MG tablet Take 2 tablets (650 mg total) by mouth every 6 (six) hours as needed for mild pain or moderate pain (or Fever >/= 101).   albuterol (PROVENTIL) (2.5 MG/3ML) 0.083% nebulizer solution Take 3 mLs (2.5 mg total) by nebulization every 6 (six) hours as needed for wheezing or shortness of breath.   ALPRAZolam (XANAX) 0.5 MG tablet Take 1 tablet (0.5 mg total) by mouth 2 (two) times daily as needed  for anxiety.   ARIPiprazole (ABILIFY) 5 MG tablet Take 1 tablet (5 mg total) by mouth daily.   ascorbic acid (VITAMIN C) 500 MG tablet Take 1 tablet (500 mg total) by mouth daily.   busPIRone (BUSPAR) 10 MG tablet Take 1 tablet (10 mg total) by mouth 2 (two) times daily.   calcium citrate-vitamin D 500-500 MG-UNIT chewable tablet Chew 0.5 tablets by mouth daily.   Cholecalciferol (VITAMIN D) 125 MCG (5000 UT) CAPS Take 1 capsule by mouth daily.   ELIQUIS 5 MG TABS tablet TAKE 1 TABLET BY MOUTH TWICE DAILY   famotidine (PEPCID) 20 MG tablet Take 1 tablet (20 mg total) by mouth daily.   FLUoxetine (PROZAC) 20 MG capsule TAKE (1) CAPSULE BY MOUTH ONCE DAILY.   FLUoxetine (PROZAC) 40 MG capsule Take 1 capsule (40 mg total) by mouth daily.   folic acid (FOLVITE) 1 MG tablet Take 1 mg by mouth daily.    furosemide (LASIX) 20 MG tablet TAKE (1) TABLET BY MOUTH DAILY AS NEEDED.   levothyroxine (SYNTHROID, LEVOTHROID) 88 MCG tablet Take 88 mcg by mouth daily before breakfast.   metFORMIN (GLUCOPHAGE-XR) 500 MG 24 hr tablet Take 500 mg by mouth daily.   metoprolol tartrate (LOPRESSOR) 25 MG tablet Take 25 mg by mouth 2 (two) times daily.   mupirocin ointment (BACTROBAN) 2 % Apply 1 application topically 2 (two) times daily. Intranasal - to affected area.   [START ON 10/24/2021] oxyCODONE-acetaminophen (PERCOCET) 5-325 MG tablet Take 1 tablet by mouth every 8 (eight) hours as needed for severe pain. Must last 30 days.   [START ON 11/23/2021] oxyCODONE-acetaminophen (PERCOCET) 5-325 MG tablet Take 1 tablet by mouth every 8 (eight) hours as needed for severe pain. Must last 30 days.   polyethylene glycol (MIRALAX / GLYCOLAX) 17 g packet Take 17 g by mouth daily as needed for mild constipation.   pramipexole (MIRAPEX) 0.125 MG tablet Take 0.25 mg by mouth at bedtime.    pregabalin (LYRICA) 100 MG capsule Take 1 capsule (100 mg total) by mouth 3 (three) times daily.   QUEtiapine (SEROQUEL) 300 MG tablet Take 1  tablet (300 mg total) by mouth at bedtime.   QUEtiapine (SEROQUEL) 300 MG tablet TAKE ONE TABLET BY MOUTH AT BEDTIME.   solifenacin (VESICARE) 5 MG tablet Take 5 mg by mouth daily.   TRADJENTA 5 MG TABS tablet Take 5 mg by mouth daily.   zinc sulfate 220 (50 Zn) MG capsule Take 220 mg by mouth at bedtime.    zolpidem (AMBIEN) 5 MG tablet Take 1 tablet (5 mg total) by mouth at bedtime.   zolpidem (AMBIEN) 5 MG tablet Take 1 tablet (5 mg total) by mouth at bedtime.   Tiotropium Bromide-Olodaterol (STIOLTO RESPIMAT) 2.5-2.5 MCG/ACT AERS Inhale 2  puffs into the lungs daily.  In addition to Stiolto the patient was also taking Anoro which is duplication of medication.  Immunization History  Administered Date(s) Administered   Influenza, Seasonal, Injecte, Preservative Fre 02/04/2005   Influenza,inj,Quad PF,6+ Mos 04/14/2015, 03/23/2017, 02/07/2018, 02/01/2019, 02/07/2020, 02/08/2021   Influenza-Unspecified 02/02/2019   PFIZER(Purple Top)SARS-COV-2 Vaccination 03/18/2020, 04/08/2020, 06/03/2020   Pfizer Covid-19 Vaccine Bivalent Booster 78yrs & up 07/13/2021   Pneumococcal Conjugate-13 04/22/2014   Pneumococcal Polysaccharide-23 03/23/2017, 04/16/2019       Objective:   Physical Exam BP 132/76 (BP Location: Left Wrist, Cuff Size: Large)   Pulse 67   Temp 97.6 F (36.4 C) (Temporal)   Ht 5\' 6"  (1.676 m)   Wt (!) 319 lb (144.7 kg)   LMP 04/20/2001   SpO2 91%   BMI 51.49 kg/m O2 set at 3 L/min pulse. GENERAL: Morbidly obese woman, presents in transport chair.  No respiratory distress.  Comfortable with nasal cannula O2 oxygen on.  No conversational dyspnea. HEAD: Normocephalic, atraumatic. EYES: Pupils equal, round, reactive to light.  No scleral icterus. MOUTH: Nose/mouth/throat not examined due to masking requirements for COVID 19. NECK: Supple. No thyromegaly. Trachea midline. No JVD.  No adenopathy. PULMONARY: Good air entry bilaterally.  She has faint end expiratory wheezes  throughout.   CARDIOVASCULAR: S1 and S2. Regular rate and rhythm.  Grade 1/6 to 2/6 systolic ejection murmur left sternal border. ABDOMEN: Obese, soft, nondistended. MUSCULOSKELETAL: RA changes both hands.  No clubbing or edema noted, no increased warmth in the lower extremities.  No dressings in the lower extremities.  Chronic stasis changes but no overt edema. NEUROLOGIC: No overt focal deficit, speech is fluent. SKIN: Intact,warm,dry.  Chronic stasis changes lower extremities.   PSYCH: Subdued mood.     Assessment & Plan:     ICD-10-CM   1. Shortness of breath  R06.02 ECHOCARDIOGRAM COMPLETE    Pulmonary Function Test [facility] Only   Needs to get echo that was ordered in March -reordered Obtain PFTs     2. Chronic asthmatic bronchitis (HCC)  J44.9    Currently on duplicate medications Discontinue Stiolto and Anoro Breztri 2 puffs twice a day Continue as needed albuterol Reassess with PFTs    3. Pulmonary fibrosis, postinflammatory (HCC)  J84.10    Post COVID-19 Sequela of chronic respiratory failure    4. Chronic respiratory failure with hypoxia (HCC)  J96.11    Continue oxygen supplementation Patient compliant    5. Morbid obesity with BMI of 50.0-59.9, adult [facility])  E66.01    Z68.43    This issue adds complexity to her management Obesity with obesity hypoventilation likely     Orders Placed This Encounter  Procedures   Pulmonary Function Test [facility] Only    Next available.    Standing Status:   Future    Standing Expiration Date:   10/02/2022    Order Specific Question:   Full PFT: includes the following: basic spirometry, spirometry pre & post bronchodilator, diffusion capacity (DLCO), lung volumes    Answer:   Full PFT   ECHOCARDIOGRAM COMPLETE    Standing Status:   Future    Standing Expiration Date:   04/02/2022    Order Specific Question:   Where should this test be performed    Answer:   [facility]    Order Specific Question:   Perflutren DEFINITY (image  enhancing agent) should be administered unless hypersensitivity or allergy exist    Answer:   Administer Perflutren    Order  Specific Question:   Reason for exam-Echo    Answer:   Dyspnea  R06.00   Meds ordered this encounter  Medications   Budeson-Glycopyrrol-Formoterol (BREZTRI AEROSPHERE) 160-9-4.8 MCG/ACT AERO    Sig: Inhale 2 puffs into the lungs in the morning and at bedtime.    Dispense:  5.9 g    Refill:  0    Order Specific Question:   Lot Number?    Answer:   [id number]    Order Specific Question:   Expiration Date?    Answer:   03/19/2024    Order Specific Question:   Manufacturer?    Answer:   [employer] [[id number]]    Order Specific Question:   Quantity    Answer:   2   Will obtain 2D echo to exclude potential pulmonary hypertension, reassess potential cardiac causes of dyspnea.  Patient also had PFT.  We will discontinue Anoro and Stiolto which are duplicates, place the patient on Breztri 2 puffs twice a day.  She was given some samples.  She is to contact us and let us know how the medication is doing for her.  We will see her in follow-up in 6 to 8 weeks time with either me or the nurse practitioner at that time.  Renold Don, MD Advanced Bronchoscopy PCCM [facility]    *This note was dictated using voice recognition software/Dragon.  Despite best efforts to proofread, errors can occur which can change the meaning. Any transcriptional errors that result from this process are unintentional and may not be fully corrected at the time of dictation.

## 2021-10-08 DIAGNOSIS — M0579 Rheumatoid arthritis with rheumatoid factor of multiple sites without organ or systems involvement: Principal | ICD-10-CM

## 2021-10-08 MED ORDER — HYDROXYCHLOROQUINE 200 MG TABLET
ORAL_TABLET | Freq: Two times a day (BID) | ORAL | 3 refills | 90 days | Status: CP
Start: 2021-10-08 — End: ?

## 2021-10-09 DIAGNOSIS — E782 Mixed hyperlipidemia: Principal | ICD-10-CM

## 2021-10-09 MED ORDER — ASCORBIC ACID (VITAMIN C) 500 MG TABLET
ORAL_TABLET | Freq: Every day | ORAL | 3 refills | 90 days | Status: CP
Start: 2021-10-09 — End: 2022-10-09

## 2021-10-09 MED ORDER — AMLODIPINE 10 MG TABLET
ORAL_TABLET | 11 refills | 0 days
Start: 2021-10-09 — End: ?

## 2021-10-09 MED ORDER — ATORVASTATIN 80 MG TABLET
ORAL_TABLET | 11 refills | 0.00000 days
Start: 2021-10-09 — End: ?

## 2021-10-09 MED ORDER — ZINC SULFATE 50 MG ZINC (220 MG) CAPSULE
ORAL_CAPSULE | 11 refills | 0.00000 days
Start: 2021-10-09 — End: ?

## 2021-10-09 MED ORDER — PRAMIPEXOLE 0.125 MG TABLET
ORAL_TABLET | 11 refills | 0 days
Start: 2021-10-09 — End: ?

## 2021-10-12 MED ORDER — ATORVASTATIN 80 MG TABLET
ORAL_TABLET | Freq: Every evening | ORAL | 1 refills | 90 days | Status: CP
Start: 2021-10-12 — End: 2022-10-12

## 2021-10-12 MED ORDER — PRAMIPEXOLE 0.125 MG TABLET
ORAL_TABLET | Freq: Every evening | ORAL | 1 refills | 90.00000 days | Status: CP
Start: 2021-10-12 — End: 2022-10-12

## 2021-10-12 MED ORDER — AMLODIPINE 10 MG TABLET
ORAL_TABLET | Freq: Every day | ORAL | 1 refills | 90 days | Status: CP
Start: 2021-10-12 — End: 2022-10-12

## 2021-10-12 MED ORDER — ZINC SULFATE 50 MG ZINC (220 MG) CAPSULE
ORAL_CAPSULE | Freq: Every day | ORAL | 1 refills | 90 days | Status: CP
Start: 2021-10-12 — End: 2022-10-12

## 2021-10-23 DIAGNOSIS — E1169 Type 2 diabetes mellitus with other specified complication: Principal | ICD-10-CM

## 2021-10-23 DIAGNOSIS — Z794 Long term (current) use of insulin: Principal | ICD-10-CM

## 2021-10-23 MED ORDER — DEXCOM G6 TRANSMITTER DEVICE
0 refills | 0 days | Status: CP
Start: 2021-10-23 — End: ?

## 2021-10-23 MED ORDER — DEXCOM G6 SENSOR DEVICE
0 refills | 0 days | Status: CP
Start: 2021-10-23 — End: ?

## 2021-11-03 ENCOUNTER — Ambulatory Visit (INDEPENDENT_AMBULATORY_CARE_PROVIDER_SITE_OTHER): Payer: Medicaid Other

## 2021-11-03 DIAGNOSIS — R0602 Shortness of breath: Secondary | ICD-10-CM | POA: Diagnosis not present

## 2021-11-03 LAB — ECHOCARDIOGRAM COMPLETE
AR max vel: 2.7 cm2
AV Area VTI: 2.66 cm2
AV Area mean vel: 2.41 cm2
AV Mean grad: 3 mmHg
AV Peak grad: 4.9 mmHg
Ao pk vel: 1.11 m/s
Area-P 1/2: 3.4 cm2
Calc EF: 63.2 %
S' Lateral: 4.1 cm
Single Plane A2C EF: 56.6 %
Single Plane A4C EF: 67 %

## 2021-11-03 MED ORDER — METOPROLOL TARTRATE 25 MG TABLET
ORAL_TABLET | ORAL | 0 refills | 0.00000 days | Status: CP
Start: 2021-11-03 — End: ?

## 2021-11-03 MED ORDER — CHOLECALCIFEROL (VITAMIN D3) 125 MCG (5,000 UNIT) CAPSULE
ORAL_CAPSULE | 0 refills | 0.00000 days | Status: CP
Start: 2021-11-03 — End: ?

## 2021-11-03 MED ORDER — FAMOTIDINE 20 MG TABLET
ORAL_TABLET | 0 refills | 0.00000 days | Status: CP
Start: 2021-11-03 — End: ?

## 2021-11-06 ENCOUNTER — Ambulatory Visit: Admit: 2021-11-06 | Discharge: 2021-11-07 | Payer: PRIVATE HEALTH INSURANCE

## 2021-11-06 DIAGNOSIS — E1143 Type 2 diabetes mellitus with diabetic autonomic (poly)neuropathy: Principal | ICD-10-CM

## 2021-11-06 DIAGNOSIS — R112 Nausea with vomiting, unspecified: Principal | ICD-10-CM

## 2021-11-06 DIAGNOSIS — K3184 Gastroparesis: Principal | ICD-10-CM

## 2021-11-06 DIAGNOSIS — R198 Other specified symptoms and signs involving the digestive system and abdomen: Principal | ICD-10-CM

## 2021-11-06 MED ORDER — METOCLOPRAMIDE 5 MG/5 ML ORAL SOLUTION
Freq: Three times a day (TID) | ORAL | 0 refills | 8 days
Start: 2021-11-06 — End: 2022-11-06

## 2021-12-03 MED ORDER — PREDNISONE 5 MG TABLET
ORAL_TABLET | ORAL | 0 refills | 12 days | Status: CP
Start: 2021-12-03 — End: 2021-12-15

## 2021-12-04 ENCOUNTER — Ambulatory Visit: Payer: Medicaid Other | Attending: Pulmonary Disease

## 2021-12-15 ENCOUNTER — Telehealth (INDEPENDENT_AMBULATORY_CARE_PROVIDER_SITE_OTHER): Payer: Medicaid Other | Admitting: Psychiatry

## 2021-12-15 DIAGNOSIS — F411 Generalized anxiety disorder: Secondary | ICD-10-CM

## 2021-12-15 DIAGNOSIS — F331 Major depressive disorder, recurrent, moderate: Secondary | ICD-10-CM

## 2021-12-15 DIAGNOSIS — F410 Panic disorder [episodic paroxysmal anxiety] without agoraphobia: Secondary | ICD-10-CM

## 2021-12-15 MED ORDER — BUSPIRONE HCL 10 MG PO TABS: 10.0000 mg | ORAL_TABLET | Freq: Two times a day (BID) | ORAL | 5 refills | Status: DC

## 2021-12-15 MED ORDER — ZOLPIDEM TARTRATE 10 MG PO TABS: 10.0000 mg | ORAL_TABLET | Freq: Every day | ORAL | 5 refills | Status: DC

## 2021-12-15 MED ORDER — ARIPIPRAZOLE 5 MG PO TABS: 5.0000 mg | ORAL_TABLET | Freq: Every day | ORAL | 5 refills | Status: DC

## 2021-12-15 MED ORDER — FLUOXETINE HCL 40 MG PO CAPS: 40.0000 mg | ORAL_CAPSULE | Freq: Every day | ORAL | 5 refills | Status: DC

## 2021-12-15 MED ORDER — QUETIAPINE FUMARATE 300 MG PO TABS
ORAL_TABLET | ORAL | 5 refills | Status: DC
Start: 2021-12-15 — End: 2022-06-11

## 2021-12-15 NOTE — Progress Notes (Signed)
Virtual Visit via Telephone Note  I connected with JAICE LAGUE on 12/15/21 at  1:20 PM EDT by telephone and verified that I am speaking with the correct person using two identifiers.  Location: Patient: Home Provider: Hospital   I discussed the limitations, risks, security and privacy concerns of performing an evaluation and management service by telephone and the availability of in person appointments. I also discussed with the patient that there may be a patient responsible charge related to this service. The patient expressed understanding and agreed to proceed.   History of Present Illness: Patient reached by telephone.  Identities established.  Patient stated that she was doing well.  Having some medical issues.  Specifically says that she has developed gastroparesis and her doctor would like her to be on Reglan and wanted to know if that was okay with her current medicine.  Patient later mentioned to me a belief that she is having more panic attacks and trouble sleeping but was not mentioned at first.  Denies suicidal thoughts denies psychosis.   Observations/Objective: Reviewing medicine she is on antipsychotics but seems to tolerate them well and I think there would be no problem with adding Reglan to what she is taking.  If she has side effects it can be discontinued.  I advised her that I could increase the dose of her Ambien to 10 mg at night for sleep but would not change any of her other medicines at this point.  Encouraged her to be seeking a psychotherapist.  Follow-up 6 months   Assessment and Plan:   Follow Up Instructions:    I discussed the assessment and treatment plan with the patient. The patient was provided an opportunity to ask questions and all were answered. The patient agreed with the plan and demonstrated an understanding of the instructions.   The patient was advised to call back or seek an in-person evaluation if the symptoms worsen or if the condition fails  to improve as anticipated.  I provided 20 minutes of non-face-to-face time during this encounter.   Mordecai Rasmussen, MD

## 2021-12-17 ENCOUNTER — Ambulatory Visit
Payer: Medicaid Other | Attending: Student in an Organized Health Care Education/Training Program | Admitting: Student in an Organized Health Care Education/Training Program

## 2021-12-17 ENCOUNTER — Ambulatory Visit (INDEPENDENT_AMBULATORY_CARE_PROVIDER_SITE_OTHER): Payer: Medicaid Other | Admitting: Pulmonary Disease

## 2021-12-17 ENCOUNTER — Encounter: Payer: Self-pay | Admitting: Student in an Organized Health Care Education/Training Program

## 2021-12-17 ENCOUNTER — Encounter: Payer: Self-pay | Admitting: Pulmonary Disease

## 2021-12-17 VITALS — BP 118/70 | HR 66 | Temp 97.8°F | Ht 66.0 in | Wt 325.0 lb

## 2021-12-17 VITALS — BP 88/76 | HR 70 | Temp 97.4°F | Resp 18 | Ht 66.0 in | Wt 325.0 lb

## 2021-12-17 DIAGNOSIS — R0602 Shortness of breath: Secondary | ICD-10-CM | POA: Diagnosis not present

## 2021-12-17 DIAGNOSIS — M059 Rheumatoid arthritis with rheumatoid factor, unspecified: Secondary | ICD-10-CM

## 2021-12-17 DIAGNOSIS — M05711 Rheumatoid arthritis with rheumatoid factor of right shoulder without organ or systems involvement: Secondary | ICD-10-CM | POA: Diagnosis present

## 2021-12-17 DIAGNOSIS — M05712 Rheumatoid arthritis with rheumatoid factor of left shoulder without organ or systems involvement: Secondary | ICD-10-CM | POA: Diagnosis present

## 2021-12-17 DIAGNOSIS — M79673 Pain in unspecified foot: Secondary | ICD-10-CM | POA: Diagnosis not present

## 2021-12-17 DIAGNOSIS — J9611 Chronic respiratory failure with hypoxia: Secondary | ICD-10-CM

## 2021-12-17 DIAGNOSIS — G894 Chronic pain syndrome: Secondary | ICD-10-CM | POA: Diagnosis present

## 2021-12-17 DIAGNOSIS — J449 Chronic obstructive pulmonary disease, unspecified: Secondary | ICD-10-CM | POA: Diagnosis not present

## 2021-12-17 DIAGNOSIS — E1142 Type 2 diabetes mellitus with diabetic polyneuropathy: Secondary | ICD-10-CM | POA: Diagnosis present

## 2021-12-17 DIAGNOSIS — M792 Neuralgia and neuritis, unspecified: Secondary | ICD-10-CM | POA: Diagnosis not present

## 2021-12-17 DIAGNOSIS — M069 Rheumatoid arthritis, unspecified: Secondary | ICD-10-CM

## 2021-12-17 DIAGNOSIS — J8410 Pulmonary fibrosis, unspecified: Secondary | ICD-10-CM

## 2021-12-17 DIAGNOSIS — G8929 Other chronic pain: Secondary | ICD-10-CM | POA: Diagnosis present

## 2021-12-17 DIAGNOSIS — Z6841 Body Mass Index (BMI) 40.0 and over, adult: Secondary | ICD-10-CM

## 2021-12-17 MED ORDER — BUPRENORPHINE 7.5 MCG/HR TD PTWK: 1.0000 | MEDICATED_PATCH | TRANSDERMAL | 0 refills | Status: DC

## 2021-12-17 MED ORDER — BUPRENORPHINE 10 MCG/HR TD PTWK: 1.0000 | MEDICATED_PATCH | TRANSDERMAL | 0 refills | Status: DC

## 2021-12-17 NOTE — Progress Notes (Signed)
PROVIDER NOTE: Information contained herein reflects review and annotations entered in association with encounter. Interpretation of such information and data should be left to medically-trained personnel. Information provided to patient can be located elsewhere in the medical record under "Patient Instructions". Document created using STT-dictation technology, any transcriptional errors that may result from process are unintentional.    Patient: [name] [name]  Service Category: E/M  Provider:  , MD  DOB: 09/19/1960  DOS: 12/17/2021  Specialty: Interventional Pain Management  MRN: [id number]  Setting: Ambulatory outpatient  PCP: MacDonald, Keri, NP  Type: Established Patient    Referring Provider: Care, [place] [facility]  Location: Office  Delivery: Face-to-face     HPI  Ms. Marlina E Hallum, a 61 y.o. year old female, is here today because of her Diabetic peripheral neuropathy (HCC) [E11.42]. Ms. Enriques's primary complain today is low back and bilateral leg pain Last encounter: My last encounter with her was on 09/24/21 Pertinent problems: Ms. Gorton has Chronic wrist pain (Secondary area of Pain) (Bilateral) (L>R); Chronic knee pain (Bilateral) (R>L); Long term current use of opiate analgesic; Opiate use (30 MME/Day); Neurogenic pain; Neuropathic pain; Diabetic peripheral neuropathy (HCC); Osteoarthritis, multiple sites; Chronic foot pain (Primary Area of Pain) (Bilateral) (L>R); Chronic elbow pain (Third area of Pain) (Bilateral) (L>R); Chronic upper back pain; Chronic pain syndrome; Rheumatoid arthritis involving multiple sites with positive rheumatoid factor (HCC); CKD (chronic kidney disease), stage III (HCC); Morbid obesity with BMI of 50.0-59.9, adult (HCC); and Severe sepsis with septic shock (HCC) on their pertinent problem list. Pain Assessment: Severity of Chronic pain is reported as a 5 /10. Location: Other (Comment) (joints) Lower/ . Onset: More than a month ago. Quality: Stabbing.  Timing: Constant. Modifying factor(s): "pain meds are not helping like they used to". Vitals:  height is 5' 6" (1.676 m) and weight is 325 lb (147.4 kg) (abnormal). Her temperature is 97.4 F (36.3 C) (abnormal). Her blood pressure is 88/76 (abnormal) and her pulse is 70. Her respiration is 18 and oxygen saturation is 96%.   Reason for encounter: medication management.    Patient presents today for medication management.  She states that her Percocet dose is no longer effective.  She states that she no longer notices any pain relief when she takes it.  I informed her that she is likely experiencing tolerance.  I discussed her options which include opioid rotation or opioid holiday.  She states that she would not be able to tolerate an opioid holiday.  She has tried tramadol, hydrocodone in the past which were not effective.  She has also tried morphine over 5 years ago which she states resulted in severe side effects of confusion and GI side effects.  I discussed buprenorphine transdermal patch for chronic pain management.  This is also safer for her from a respiratory standpoint.  Given that she is not opioid nave, recommend starting at 7.5 mcg an hour for the first month and then increasing to 10 mcg an hour for the second month.  I will have her back within 6 to 8 weeks for follow-up and to assess response to new medication trial.   Pharmacotherapy Assessment  Analgesic: Percocet 5 mg TID PRN --> rotation to buprenorphine given tolerance to oxycodone and persistent chronic pain     Monitoring: [place] PMP: PDMP reviewed during this encounter.       Pharmacotherapy: No side-effects or adverse reactions reported. Compliance: No problems identified. Effectiveness: Clinically acceptable.  UDS:  Summary  Date Value Ref   Range Status  09/16/2020 Note  Final    Comment:    ==================================================================== [employer] [facility] 13  (MW) ==================================================================== Test                             Result       Flag       Units  Drug Present and Declared for Prescription Verification   Oxycodone                      615          EXPECTED   ng/mg creat   Noroxycodone                   587          EXPECTED   ng/mg creat    Sources of oxycodone include scheduled prescription medications.    Noroxycodone is an expected metabolite of oxycodone.  Drug Absent but Declared for Prescription Verification   Alprazolam                     Not Detected UNEXPECTED ng/mg creat ==================================================================== Test                      Result    Flag   Units      Ref Range   Creatinine              139              mg/dL      >=20 ==================================================================== Declared Medications:  The flagging and interpretation on this report are based on the  following declared medications.  Unexpected results may arise from  inaccuracies in the declared medications.   **Note: The testing scope of this panel includes these medications:   Alprazolam (Xanax)  Oxycodone (Percocet)   **Note: The testing scope of this panel does not include the  following reported medications:   Acetaminophen (Tylenol)  Acetaminophen (Percocet)  Albuterol (Duoneb)  Apixaban (Eliquis)  Aripiprazole (Abilify)  Atorvastatin  Budesonide (Pulmicort)  Calcium  Cholecalciferol  Fluoxetine (Prozac)  Folic Acid  Furosemide  Insulin (Humalog)  Ipratropium (Duoneb)  Levothyroxine  Linagliptin (Tradjenta)  Metformin  Mupirocin  Polyethylene Glycol (MiraLAX)  Pramipexole (Mirapex)  Pregabalin (Lyrica)  Quetiapine (Seroquel)  Solifenacin (Vesicare)  Vancomycin  Vitamin C  Vitamin D  Zinc  Zolpidem (Ambien) ==================================================================== For clinical consultation, please call [phone]. ====================================================================       ROS  Constitutional:  Intermittent shortness of breath Gastrointestinal: No reported hemesis, hematochezia, vomiting, or acute GI distress Musculoskeletal:  Low back pain, left ankle pain, bilateral shoulder pain right greater than left Neurological: No reported episodes of acute onset apraxia, aphasia, dysarthria, agnosia, amnesia, paralysis, loss of coordination, or loss of consciousness  Medication Review  ARIPiprazole, Budeson-Glycopyrrol-Formoterol, FLUoxetine, Insulin Degludec, QUEtiapine, Vitamin D, acetaminophen, albuterol, apixaban, ascorbic acid, buprenorphine, busPIRone, calcium citrate-vitamin D, famotidine, folic acid, furosemide, levothyroxine, linagliptin, metFORMIN, methotrexate, metoprolol tartrate, mupirocin ointment, polyethylene glycol, pramipexole, pregabalin, solifenacin, zinc sulfate, and zolpidem  History Review  Allergy: Ms. Averitt is allergic to cephalexin, codeine, doxycycline, propoxyphene, sulfa antibiotics, clindamycin, lovenox [enoxaparin sodium], hydrocodone, and meropenem. Drug: Ms. Eisenhower  reports no history of drug use. Alcohol:  reports no history of alcohol use. Tobacco:  reports that she quit smoking about 22 years ago. Her smoking use included cigarettes. She has a 54.00 pack-year smoking history. She  has never used smokeless tobacco. Social: Ms. Underhill  reports that she quit smoking about 22 years ago. Her smoking use included cigarettes. She has a 54.00 pack-year smoking history. She has never used smokeless tobacco. She reports that she does not drink alcohol and does not use drugs. Medical:  has a past medical history of Abdominal wall hernia (01/29/2013), Anxiety, Arthritis, C. difficile colitis, Chronic diastolic heart failure ([place]), COVID-19 (03/23/2019), Depression, Diabetes mellitus, Diastolic CHF ([place]), Esophagitis, Fluid retention, GERD (gastroesophageal reflux  disease), Hiatal hernia, Hypertension, Hypokalemia due to loss of potassium (10/21/2015), Hypothyroidism, IBS (irritable bowel syndrome), Moderate episode of recurrent major depressive disorder ([place]) (06/03/2004), Morbid obesity ([place]), MRSA (methicillin resistant Staphylococcus aureus) infection (11/2017), Neurogenic bladder, Neuropathy, Obesity, Panic attacks, Pneumonia due to COVID-19 virus, Rheumatoid arthritis ([place]), and Sleep apnea. Surgical: Ms. Gailey  has a past surgical history that includes Tubal ligation; Tonsillectomy; Cholecystectomy; Abdominal hysterectomy; Laparoscopic gastric banding (03/20/2007); Eye surgery; Hernia repair; DG GREAT TOE RIGHT FOOT (02/23/2018); TEE without cardioversion (N/A, 07/16/2019); Irrigation and debridement foot (Left, 01/11/2020); and Foot surgery (Left, 05/13/2021). Family: family history includes Alcohol abuse in her father and sister; Anxiety disorder in her father, sister, and sister; Bipolar disorder in her father and sister; Depression in her father, sister, and sister; Drug abuse in her sister; Heart attack in her brother; Heart attack (age of onset: 45) in her brother; Heart disease in her brother; Heart failure in her father.  Laboratory Chemistry Profile   Renal Lab Results  Component Value Date   BUN 15 07/29/2020   CREATININE 0.83 07/29/2020   BCR 16 11/16/2018   GFRAA >60 01/15/2020   GFRNONAA >60 07/29/2020     Hepatic Lab Results  Component Value Date   AST 32 04/28/2020   ALT 31 04/28/2020   ALBUMIN 3.3 (L) 05/21/2020   ALKPHOS 70 04/28/2020   LIPASE 176 (H) 07/13/2019     Electrolytes Lab Results  Component Value Date   NA 138 07/29/2020   K 3.7 07/29/2020   CL 100 07/29/2020   CALCIUM 9.2 07/29/2020   MG 1.7 01/15/2020   PHOS 2.8 05/21/2020     Bone Lab Results  Component Value Date   VD25OH 37.36 04/30/2019   25OHVITD1 23 (L) 11/16/2018   25OHVITD2 2.2 11/16/2018   25OHVITD3 21 11/16/2018     Inflammation (CRP:  Acute Phase) (ESR: Chronic Phase) Lab Results  Component Value Date   CRP 2.1 (H) 07/17/2019   ESRSEDRATE 65 (H) 07/17/2019   LATICACIDVEN 1.4 04/28/2020       Note: Above Lab results reviewed.   Vitals: BP (!) 88/76   Pulse 70   Temp (!) 97.4 F (36.3 C)   Resp 18   Ht 5' 6" (1.676 m)   Wt (!) 325 lb (147.4 kg)   LMP 04/20/2001   SpO2 96% Comment: 3L O2 from home  BMI 52.46 kg/m  BMI: Estimated body mass index is 52.46 kg/m as calculated from the following:   Height as of this encounter: 5' 6" (1.676 m).   Weight as of this encounter: 325 lb (147.4 kg). Ideal: Ideal body weight: 59.3 kg (130 lb 11.7 oz) Adjusted ideal body weight: 94.5 kg (208 lb 7 oz)   Physical Exam  General appearance: Well nourished, well developed, and well hydrated. In no apparent acute distress Mental status: Alert, oriented x 3 (person, place, & time)       Respiratory: Oxygen-dependent COPD,  Cervical Spine Area Exam  Skin & Axial Inspection:  No masses, redness, edema, swelling, or associated skin lesions Alignment: Symmetrical Functional ROM: Pain restricted ROM      Stability: No instability detected Muscle Tone/Strength: Functionally intact. No obvious neuro-muscular anomalies detected. Sensory (Neurological): Arthropathic arthralgia Palpation: No palpable anomalies             Upper Extremity (UE) Exam    Side: Right upper extremity  Side: Left upper extremity  Skin & Extremity Inspection: Skin color, temperature, and hair growth are WNL. No peripheral edema or cyanosis. No masses, redness, swelling, asymmetry, or associated skin lesions. No contractures.  Skin & Extremity Inspection: Skin color, temperature, and hair growth are WNL. No peripheral edema or cyanosis. No masses, redness, swelling, asymmetry, or associated skin lesions. No contractures.  Functional ROM: Pain restricted ROM for shoulder and elbow  Functional ROM: Unrestricted ROM          Muscle Tone/Strength: Functionally  intact. No obvious neuro-muscular anomalies detected.  Muscle Tone/Strength: Functionally intact. No obvious neuro-muscular anomalies detected.  Sensory (Neurological): Arthropathic arthralgia          Sensory (Neurological): Unimpaired          Palpation: No palpable anomalies              Palpation: No palpable anomalies              Provocative Test(s):  Phalen's test: deferred Tinel's test: deferred Apley's scratch test (touch opposite shoulder):  Action 1 (Across chest): Decreased ROM Action 2 (Overhead): Decreased ROM Action 3 (LB reach): Decreased ROM   Provocative Test(s):  Phalen's test: deferred Tinel's test: deferred Apley's scratch test (touch opposite shoulder):  Action 1 (Across chest): deferred Action 2 (Overhead): deferred Action 3 (LB reach): deferred       Assessment   Diagnosis  1. Diabetic peripheral neuropathy ([place])   2. Neuropathic pain   3. Neurogenic pain   4. Chronic foot pain (Primary Area of Pain) (Bilateral) (L>R)   5. Rheumatoid arthritis with positive rheumatoid factor, involving unspecified site ([place])   6. Rheumatoid arthritis involving both shoulders with positive rheumatoid factor (HCC)   7. Chronic pain syndrome       Plan of Care  Ms. LESTER PLATAS has a current medication list which includes the following long-term medication(s): aripiprazole, calcium citrate-vitamin d, eliquis, famotidine, fluoxetine, fluoxetine, furosemide, pregabalin, quetiapine, quetiapine, zolpidem, zolpidem, and albuterol.  Pharmacotherapy (Medications Ordered): Meds ordered this encounter  Medications   buprenorphine (BUTRANS) 7.5 MCG/HR    Sig: Place 1 patch onto the skin once a week for 28 days.    Dispense:  4 patch    Refill:  0    Chronic Pain: STOP Act (Not applicable) Fill 1 day early if closed on refill date. Avoid benzodiazepines within 8 hours of opioids   buprenorphine (BUTRANS) 10 MCG/HR PTWK    Sig: Place 1 patch onto the skin once a week for 28  days.    Dispense:  4 patch    Refill:  0    Chronic Pain: STOP Act (Not applicable) Fill 1 day early if closed on refill date. Avoid benzodiazepines within 8 hours of opioids  Discontinue Percocet.  We tried to proceed with Qutenza, capsaicin topical treatment for painful diabetic neuropathy however insurance would not cover.  Follow-up plan:   Return in about 8 weeks (around 02/11/2022) for Medication Management, in person.   Recent Visits Date Type Provider Dept  09/24/21 Office Visit Gillis Santa, MD [facility]  Showing recent  visits within past 90 days and meeting all other requirements Today's Visits Date Type Provider Dept  12/17/21 Office Visit Gillis Santa, MD [facility]  Showing today's visits and meeting all other requirements Future Appointments Date Type Provider Dept  02/02/22 Appointment Gillis Santa, MD [facility]  Showing future appointments within next 90 days and meeting all other requirements  I discussed the assessment and treatment plan with the patient. The patient was provided an opportunity to ask questions and all were answered. The patient agreed with the plan and demonstrated an understanding of the instructions.  Patient advised to call back or seek an in-person evaluation if the symptoms or condition worsens.  Duration of encounter: 30 minutes.  Note by: Gillis Santa, MD Date: 12/17/2021; Time: 11:49 AM

## 2021-12-17 NOTE — Progress Notes (Signed)
Subjective:    Patient ID: [name] [name], female    DOB: 07-16-1960, 61 y.o.   MRN: [id number] Patient Care Team: Verita Lamb, NP as PCP - General  Chief Complaint  Patient presents with   Follow-up    SOB with exertion.    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  Patient also has extreme obesity with obesity hypoventilation.  This is a scheduled visit.  Patient was last seen on 01 October 2021 at that time she was given a trial of Breztri 2 puffs twice a day and all other inhalers except for albuterol rescue inhaler where discontinued.  She notes that Judithann Sauger helps her breathing significantly.  She was scheduled to have repeat PFTs however she no showed to her PFT appointment.  She has difficulty making appointments due to frequent issues with her gastroparesis.  She did have a 2D echo performed on 18 July that was essentially unchanged from prior.  She is compliant with oxygen at 2 L/min.  Has not had any fevers, chills or sweats.  As noted, she notes improvement in her dyspnea and stamina on the [place].  Does not endorse any other symptomatology.  Overall she feels she is doing well today.  DATA 07/08/2019 echocardiogram: LVEF 60 to 65%, grade 1 DD 01/23/2020 PFTs: FEV1 2.19 L or 78% predicted, FVC 2.59 L or 71% predicted, FEV1/FVC 84%, no bronchodilator response.  ERV 10% lung volumes otherwise normal diffusion capacity mildly to moderately impaired 11/03/2021 echocardiogram: LVEF 55 to 60%, grade 1 DD, LA size mildly to moderately dilated.   Review of Systems A 10 point review of systems was performed and it is as noted above otherwise negative.  Patient Active Problem List   Diagnosis Date Noted   DVT (deep vein thrombosis) in pregnancy  05/27/2020   DVT of popliteal vein ([place]) 05/27/2020   Postinflammatory pulmonary fibrosis ([place]) 02/07/2020   Chronic respiratory failure with hypoxia ([place]) 02/07/2020   Anemia 02/07/2020   Lower extremity cellulitis 01/10/2020   Bacteremia    Paroxysmal A-fib (HCC)    Right arm weakness    Ileus ([place])    Splenic infarct    Acute metabolic encephalopathy [id number]   Hyperglycemia due to type 2 diabetes mellitus ([place]) 07/08/2019   Morbid obesity with BMI of 50.0-59.9, adult ([place]) 07/08/2019   Severe sepsis with septic shock ([employer]) 07/08/2019   Septic shock ([place]) 07/08/2019   History of severe acute respiratory syndrome coronavirus 2 (SARS-CoV-2) disease 05/24/2019   Acute respiratory disease due to COVID-19 virus 04/06/2019   Type 2 diabetes mellitus without complication, without long-term current use of insulin ([place]) 04/06/2019   CKD (chronic kidney disease), stage III ([place]) 04/06/2019   HLD (hyperlipidemia) 04/06/2019   COVID-19 03/27/2019   Pneumonia due to COVID-19 virus 03/27/2019   Abdominal pain 02/12/2019   AKI (acute kidney injury) ([place]) 02/12/2019   Cellulitis, abdominal wall 02/04/2019   Elevated C-reactive protein (CRP) 12/15/2018   Elevated sed rate 12/15/2018   Pharmacologic therapy 11/06/2018   Disorder of skeletal system 11/06/2018   Problems influencing health status 11/06/2018   Decubitus ulcer of heel, bilateral 02/21/2018   Diabetic ulcer of toe of right foot associated with type 2 diabetes mellitus ([place]) 02/14/2018   Ulcer of left heel and  midfoot with fat layer exposed (HCC) 02/14/2018   Sepsis due to methicillin resistant Staphylococcus aureus (MRSA) without acute organ dysfunction (HCC) 11/17/2017   MRSA (methicillin resistant Staphylococcus aureus) infection 11/17/2017   Cellulitis 11/15/2017   Perineal abscess 11/15/2017   Subacute vulvitis 11/01/2017   Chronic cystitis 03/01/2017   Chronic pain syndrome 03/31/2016   Insomnia secondary to chronic pain  03/31/2016   Chronic upper back pain 12/25/2015   Chronic hand pain (Bilateral) (L>R) 12/25/2015   Rheumatoid arthritis (HCC) 12/25/2015   Osteoarthritis, multiple sites 12/24/2015   Chronic foot pain (Primary Area of Pain) (Bilateral) (L>R) 12/24/2015   Chronic elbow pain (Third area of Pain) (Bilateral) (L>R) 12/24/2015   Chronic shoulder pain (Bilateral) (L>R) 12/24/2015   Chronic neck pain (Bilateral) (R>L) 12/24/2015   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[employer]) 12/23/2015   Chronic knee pain (Bilateral) (R>L) 12/23/2015   Long term current use of opiate analgesic 12/23/2015   Long term prescription opiate use 12/23/2015   Opiate use (30 MME/Day) 12/23/2015   Neurogenic pain 12/23/2015   Neuropathic pain 12/23/2015   Diabetic peripheral neuropathy (HCC) 12/23/2015   Encounter for therapeutic drug level monitoring 12/23/2015   Encounter for pain management planning 12/23/2015   GERD (gastroesophageal reflux disease) 11/25/2015   Neuropathy 11/25/2015   Hypokalemia due to loss of potassium 10/21/2015   Hypomagnesemia 10/21/2015   QT prolongation 10/21/2015   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) 10/21/2015   Chronic wrist pain (Secondary area of Pain) (Bilateral) (L>R) 03/19/2015   Adhesive capsulitis 03/19/2015   Female genuine stress incontinence 02/14/2015   Urge incontinence of urine 02/14/2015   OSA (obstructive sleep apnea) 07/03/2014   Chronic diastolic CHF (congestive heart failure) (HCC) 07/03/2014   Anxiety 01/03/2014   Diabetic ulcer of heel (HCC) 01/03/2014   COPD (chronic obstructive pulmonary disease) (HCC) 01/03/2014   Bipolar disorder, unspecified (HCC) 01/03/2014   Diastolic dysfunction 01/03/2014   Combined fat and carbohydrate induced hyperlipemia 01/03/2014   Shortness of breath 01/03/2014   Acute on chronic congestive heart failure (HCC) 01/03/2014   Incomplete bladder emptying 11/02/2012   Bladder retention 10/10/2012    Detrusor muscle hypertonia 10/04/2012   Obstruction of urinary tract 10/04/2012   FOM (frequency of micturition) 10/04/2012   Mixed incontinence 10/04/2012   Vitamin D insufficiency 04/15/2012   Borderline personality disorder (HCC) 01/06/2012   Anxiety and depression 01/06/2012   History of laparoscopic adjustable gastric banding, 03/20/2007.  Removed 09/19/2011. 08/04/2011   Nausea & vomiting 08/04/2011   Hypothyroidism 06/28/2010   Rheumatoid arthritis involving multiple sites with positive rheumatoid factor (HCC) 06/28/2010   Obesity 03/29/2005   Essential (primary) hypertension 01/27/2005   Major depressive disorder, recurrent episode, moderate (HCC) 06/03/2004   Social History   Tobacco Use   Smoking status: Former    Packs/day: 2.00    Years: 27.00    Total pack years: 54.00    Types: Cigarettes    Quit date: 07/30/1999    Years since quitting: 22.4   Smokeless tobacco: Never   Tobacco comments:    quit in 2001 2-2.5 a day  Substance Use Topics   Alcohol use: No   Allergies  Allergen Reactions   Cephalexin Hives   Codeine Palpitations, Nausea Only, Nausea And Vomiting, Rash and Shortness Of Breath    "makes heart fly, she gets flushed and passes out"   Doxycycline Rash   Propoxyphene Rash and Shortness Of Breath    Increase heart rate   Sulfa Antibiotics  Palpitations, Nausea Only, Shortness Of Breath and Hives    "makes heart fly, she gets flushed and passes out"   Clindamycin Rash    Tongue swelling, oral sores, and Mouth rash   Lovenox [Enoxaparin Sodium] Hives   Hydrocodone Nausea And Vomiting    Hear racing & breaks out into a cold sweat.   Meropenem Rash    Erythematous, hot, pruritic rash over arms, chest, back, abdomen, and face occurred at the end of meropenem infusion on 02/22/18   Current Meds  Medication Sig   acetaminophen (TYLENOL) 325 MG tablet Take 2 tablets (650 mg total) by mouth every 6 (six) hours as needed for mild pain or moderate pain (or  Fever >/= 101).   albuterol (PROVENTIL) (2.5 MG/3ML) 0.083% nebulizer solution Take 3 mLs (2.5 mg total) by nebulization every 6 (six) hours as needed for wheezing or shortness of breath.   ARIPiprazole (ABILIFY) 5 MG tablet Take 1 tablet (5 mg total) by mouth daily.   ascorbic acid (VITAMIN C) 500 MG tablet Take 1 tablet (500 mg total) by mouth daily.   Budeson-Glycopyrrol-Formoterol (BREZTRI AEROSPHERE) 160-9-4.8 MCG/ACT AERO Inhale 2 puffs into the lungs in the morning and at bedtime.   [START ON 01/14/2022] buprenorphine (BUTRANS) 10 MCG/HR PTWK Place 1 patch onto the skin once a week for 28 days.   buprenorphine (BUTRANS) 7.5 MCG/HR Place 1 patch onto the skin once a week for 28 days.   busPIRone (BUSPAR) 10 MG tablet Take 1 tablet (10 mg total) by mouth 2 (two) times daily.   calcium citrate-vitamin D 500-500 MG-UNIT chewable tablet Chew 0.5 tablets by mouth daily.   Cholecalciferol (VITAMIN D) 125 MCG (5000 UT) CAPS Take 1 capsule by mouth daily.   ELIQUIS 5 MG TABS tablet TAKE 1 TABLET BY MOUTH TWICE DAILY   famotidine (PEPCID) 20 MG tablet Take 1 tablet (20 mg total) by mouth daily.   FLUoxetine (PROZAC) 20 MG capsule TAKE (1) CAPSULE BY MOUTH ONCE DAILY.   FLUoxetine (PROZAC) 40 MG capsule Take 1 capsule (40 mg total) by mouth daily.   folic acid (FOLVITE) 1 MG tablet Take 1 mg by mouth daily.    furosemide (LASIX) 20 MG tablet TAKE (1) TABLET BY MOUTH DAILY AS NEEDED.   Insulin Degludec 100 UNIT/ML SOLN Inject 10 Units into the skin at bedtime.   levothyroxine (SYNTHROID, LEVOTHROID) 88 MCG tablet Take 88 mcg by mouth daily before breakfast.   metFORMIN (GLUCOPHAGE-XR) 500 MG 24 hr tablet Take 500 mg by mouth daily.   methotrexate 2.5 MG tablet Take 4 tablets by mouth once a week.   metoprolol tartrate (LOPRESSOR) 25 MG tablet Take 25 mg by mouth 2 (two) times daily.   mupirocin ointment (BACTROBAN) 2 % Apply 1 application topically 2 (two) times daily. Intranasal - to affected area.    polyethylene glycol (MIRALAX / GLYCOLAX) 17 g packet Take 17 g by mouth daily as needed for mild constipation.   pramipexole (MIRAPEX) 0.125 MG tablet Take 0.25 mg by mouth at bedtime.    pregabalin (LYRICA) 100 MG capsule Take 1 capsule (100 mg total) by mouth 3 (three) times daily.   QUEtiapine (SEROQUEL) 300 MG tablet Take 1 tablet (300 mg total) by mouth at bedtime.   QUEtiapine (SEROQUEL) 300 MG tablet TAKE ONE TABLET BY MOUTH AT BEDTIME.   solifenacin (VESICARE) 5 MG tablet Take 5 mg by mouth daily.   TRADJENTA 5 MG TABS tablet Take 5 mg by mouth daily.   zinc  sulfate 220 (50 Zn) MG capsule Take 220 mg by mouth at bedtime.    zolpidem (AMBIEN) 10 MG tablet Take 1 tablet (10 mg total) by mouth at bedtime.   zolpidem (AMBIEN) 5 MG tablet Take 1 tablet (5 mg total) by mouth at bedtime.   Immunization History  Administered Date(s) Administered   Influenza, Seasonal, Injecte, Preservative Fre 02/04/2005   Influenza,inj,Quad PF,6+ Mos 04/14/2015, 03/23/2017, 02/07/2018, 02/01/2019, 02/07/2020, 02/08/2021   Influenza-Unspecified 02/02/2019   PFIZER(Purple Top)SARS-COV-2 Vaccination 03/18/2020, 04/08/2020, 06/03/2020   Pfizer Covid-19 Vaccine Bivalent Booster 32yrs & up 07/13/2021   Pneumococcal Conjugate-13 04/22/2014   Pneumococcal Polysaccharide-23 03/23/2017, 04/16/2019      Objective:   Physical Exam BP 118/70 (BP Location: Right Arm, Cuff Size: Normal)   Pulse 66   Temp 97.8 F (36.6 C) (Temporal)   Ht 5\' 6"  (1.676 m)   Wt (!) 325 lb (147.4 kg) Comment: per patient  LMP 04/20/2001   SpO2 94%   BMI 52.46 kg/m  GENERAL: Morbidly obese woman, presents in transport chair.  No respiratory distress.  Comfortable with nasal cannula O2 oxygen on.  No conversational dyspnea. HEAD: Normocephalic, atraumatic. EYES: Pupils equal, round, reactive to light.  No scleral icterus. MOUTH: Nose/mouth/throat not examined due to masking requirements for COVID 19. NECK: Supple. No thyromegaly.  Trachea midline. No JVD.  No adenopathy. PULMONARY: Good air entry bilaterally.  She has faint end expiratory wheezes throughout.   CARDIOVASCULAR: S1 and S2. Regular rate and rhythm.  Grade 1/6 to 2/6 systolic ejection murmur left sternal border. ABDOMEN: Obese, soft, nondistended. MUSCULOSKELETAL: RA changes both hands.  No clubbing or edema noted, no increased warmth in the lower extremities. Chronic stasis changes but no overt edema. NEUROLOGIC: No overt focal deficit, speech is fluent. SKIN: Intact,warm,dry.  Chronic stasis changes lower extremities.   PSYCH: Mood and behavior normal.    Assessment & Plan:     ICD-10-CM   1. Chronic asthmatic bronchitis (HCC)  J44.9    Continue Breztri 2 puffs twice a day Patient appears well compensated    2. Chronic respiratory failure with hypoxia (HCC)  J96.11    Continue oxygen at 2 L/min Patient is compliant Patient notes benefit of therapy    3. Pulmonary fibrosis, postinflammatory (HCC)  J84.10    Post COVID    4. SOB (shortness of breath)  R06.02    Improved on Breztri    5. Rheumatoid arthritis  M06.9    This issue adds complexity to her management Patient followed by rheumatology    6. Morbid obesity with BMI of 50.0-59.9, adult (HCC)  E66.01    Z68.43    This issue adds complexity to her management Weight loss recommended     We will see the patient in follow-up in 4 months time call sooner should any new difficulties arise.  08-15-1994, MD Advanced [facility]ch[facility] P[facility]  [facility]    *This note was dictated using voice recognition software/Dragon.  Despite best efforts to proofread, errors can occur which can change the meaning. Any transcriptional errors that result from this process are unintentional and may not be fully corrected at the time of dictation.

## 2021-12-17 NOTE — Progress Notes (Signed)
Nursing Pain Medication Assessment:  Safety precautions to be maintained throughout the outpatient stay will include: orient to surroundings, keep bed in low position, maintain call bell within reach at all times, provide assistance with transfer out of bed and ambulation.  Medication Inspection Compliance: Pill count conducted under aseptic conditions, in front of the patient. Neither the pills nor the bottle was removed from the patient's sight at any time. Once count was completed pills were immediately returned to the patient in their original bottle.  Medication: Oxycodone/APAP Pill/Patch Count:  0 of 90 pills remain Pill/Patch Appearance: Markings consistent with prescribed medication Bottle Appearance: Standard pharmacy container. Clearly labeled. Filled Date: 07 / 13 / 2023 Last Medication intake:   08- 14 -23

## 2021-12-17 NOTE — Patient Instructions (Signed)
Your lungs sounded really good today.  Continue taking your Breztri 2 puffs twice a day.  We will see him in follow-up in 4 months time call sooner should any new problems arise.

## 2021-12-18 ENCOUNTER — Telehealth: Payer: Self-pay | Admitting: Student in an Organized Health Care Education/Training Program

## 2021-12-18 MED ORDER — METOCLOPRAMIDE 5 MG/5 ML ORAL SOLUTION
ORAL | 3 refills | 17 days | Status: CP
Start: 2021-12-18 — End: 2022-02-26

## 2021-12-18 NOTE — Telephone Encounter (Signed)
Patches approved.  Patient aware.

## 2021-12-18 NOTE — Telephone Encounter (Signed)
Lvmail today stating her pain patches need PA

## 2021-12-20 MED ORDER — METOCLOPRAMIDE 5 MG/5 ML ORAL SOLUTION
ORAL | 3 refills | 17 days | Status: CP
Start: 2021-12-20 — End: 2022-02-28

## 2021-12-27 ENCOUNTER — Inpatient Hospital Stay
Admission: EM | Admit: 2021-12-27 | Discharge: 2021-12-29 | DRG: 689 | Disposition: A | Discharge status: Home / Self Care | Payer: Medicaid Other | Attending: Internal Medicine | Admitting: Internal Medicine

## 2021-12-27 ENCOUNTER — Other Ambulatory Visit: Payer: Self-pay

## 2021-12-27 ENCOUNTER — Emergency Department: Payer: Medicaid Other

## 2021-12-27 DIAGNOSIS — I110 Hypertensive heart disease with heart failure: Secondary | ICD-10-CM | POA: Diagnosis present

## 2021-12-27 DIAGNOSIS — Z7951 Long term (current) use of inhaled steroids: Secondary | ICD-10-CM

## 2021-12-27 DIAGNOSIS — Z9842 Cataract extraction status, left eye: Secondary | ICD-10-CM

## 2021-12-27 DIAGNOSIS — Z90710 Acquired absence of both cervix and uterus: Secondary | ICD-10-CM

## 2021-12-27 DIAGNOSIS — Z6841 Body Mass Index (BMI) 40.0 and over, adult: Secondary | ICD-10-CM

## 2021-12-27 DIAGNOSIS — F419 Anxiety disorder, unspecified: Secondary | ICD-10-CM

## 2021-12-27 DIAGNOSIS — R4182 Altered mental status, unspecified: Secondary | ICD-10-CM

## 2021-12-27 DIAGNOSIS — I480 Paroxysmal atrial fibrillation: Secondary | ICD-10-CM | POA: Diagnosis present

## 2021-12-27 DIAGNOSIS — Z818 Family history of other mental and behavioral disorders: Secondary | ICD-10-CM

## 2021-12-27 DIAGNOSIS — Z8616 Personal history of COVID-19: Secondary | ICD-10-CM

## 2021-12-27 DIAGNOSIS — K3184 Gastroparesis: Secondary | ICD-10-CM | POA: Diagnosis present

## 2021-12-27 DIAGNOSIS — F319 Bipolar disorder, unspecified: Secondary | ICD-10-CM | POA: Diagnosis present

## 2021-12-27 DIAGNOSIS — R109 Unspecified abdominal pain: Secondary | ICD-10-CM | POA: Diagnosis not present

## 2021-12-27 DIAGNOSIS — Z9049 Acquired absence of other specified parts of digestive tract: Secondary | ICD-10-CM

## 2021-12-27 DIAGNOSIS — J418 Mixed simple and mucopurulent chronic bronchitis: Secondary | ICD-10-CM | POA: Diagnosis not present

## 2021-12-27 DIAGNOSIS — I5032 Chronic diastolic (congestive) heart failure: Secondary | ICD-10-CM | POA: Diagnosis present

## 2021-12-27 DIAGNOSIS — B961 Klebsiella pneumoniae [K. pneumoniae] as the cause of diseases classified elsewhere: Secondary | ICD-10-CM | POA: Diagnosis present

## 2021-12-27 DIAGNOSIS — I10 Essential (primary) hypertension: Secondary | ICD-10-CM | POA: Diagnosis present

## 2021-12-27 DIAGNOSIS — E1143 Type 2 diabetes mellitus with diabetic autonomic (poly)neuropathy: Secondary | ICD-10-CM | POA: Diagnosis present

## 2021-12-27 DIAGNOSIS — Z8249 Family history of ischemic heart disease and other diseases of the circulatory system: Secondary | ICD-10-CM

## 2021-12-27 DIAGNOSIS — Z9981 Dependence on supplemental oxygen: Secondary | ICD-10-CM

## 2021-12-27 DIAGNOSIS — G9341 Metabolic encephalopathy: Secondary | ICD-10-CM | POA: Diagnosis not present

## 2021-12-27 DIAGNOSIS — Z881 Allergy status to other antibiotic agents status: Secondary | ICD-10-CM

## 2021-12-27 DIAGNOSIS — F418 Other specified anxiety disorders: Secondary | ICD-10-CM | POA: Diagnosis present

## 2021-12-27 DIAGNOSIS — Z813 Family history of other psychoactive substance abuse and dependence: Secondary | ICD-10-CM

## 2021-12-27 DIAGNOSIS — Z969 Presence of functional implant, unspecified: Secondary | ICD-10-CM

## 2021-12-27 DIAGNOSIS — Z7901 Long term (current) use of anticoagulants: Secondary | ICD-10-CM

## 2021-12-27 DIAGNOSIS — E039 Hypothyroidism, unspecified: Secondary | ICD-10-CM | POA: Diagnosis present

## 2021-12-27 DIAGNOSIS — Z885 Allergy status to narcotic agent status: Secondary | ICD-10-CM

## 2021-12-27 DIAGNOSIS — G2581 Restless legs syndrome: Secondary | ICD-10-CM | POA: Diagnosis present

## 2021-12-27 DIAGNOSIS — K589 Irritable bowel syndrome without diarrhea: Secondary | ICD-10-CM | POA: Diagnosis present

## 2021-12-27 DIAGNOSIS — E785 Hyperlipidemia, unspecified: Secondary | ICD-10-CM | POA: Diagnosis present

## 2021-12-27 DIAGNOSIS — Z9841 Cataract extraction status, right eye: Secondary | ICD-10-CM

## 2021-12-27 DIAGNOSIS — M069 Rheumatoid arthritis, unspecified: Secondary | ICD-10-CM | POA: Diagnosis present

## 2021-12-27 DIAGNOSIS — Z9884 Bariatric surgery status: Secondary | ICD-10-CM

## 2021-12-27 DIAGNOSIS — Z7984 Long term (current) use of oral hypoglycemic drugs: Secondary | ICD-10-CM

## 2021-12-27 DIAGNOSIS — Z882 Allergy status to sulfonamides status: Secondary | ICD-10-CM

## 2021-12-27 DIAGNOSIS — N390 Urinary tract infection, site not specified: Secondary | ICD-10-CM | POA: Diagnosis present

## 2021-12-27 DIAGNOSIS — Z87891 Personal history of nicotine dependence: Secondary | ICD-10-CM

## 2021-12-27 DIAGNOSIS — N3000 Acute cystitis without hematuria: Secondary | ICD-10-CM | POA: Diagnosis present

## 2021-12-27 DIAGNOSIS — J449 Chronic obstructive pulmonary disease, unspecified: Secondary | ICD-10-CM | POA: Diagnosis present

## 2021-12-27 DIAGNOSIS — Z950 Presence of cardiac pacemaker: Secondary | ICD-10-CM

## 2021-12-27 DIAGNOSIS — G894 Chronic pain syndrome: Secondary | ICD-10-CM | POA: Diagnosis present

## 2021-12-27 DIAGNOSIS — Z9689 Presence of other specified functional implants: Secondary | ICD-10-CM | POA: Diagnosis present

## 2021-12-27 DIAGNOSIS — Z79899 Other long term (current) drug therapy: Secondary | ICD-10-CM

## 2021-12-27 DIAGNOSIS — Z888 Allergy status to other drugs, medicaments and biological substances status: Secondary | ICD-10-CM

## 2021-12-27 DIAGNOSIS — K219 Gastro-esophageal reflux disease without esophagitis: Secondary | ICD-10-CM | POA: Diagnosis present

## 2021-12-27 LAB — CBC WITH DIFFERENTIAL/PLATELET
Abs Immature Granulocytes: 0.04 10*3/uL (ref 0.00–0.07)
Basophils Absolute: 0.1 10*3/uL (ref 0.0–0.1)
Basophils Relative: 1 %
Eosinophils Absolute: 0 10*3/uL (ref 0.0–0.5)
Eosinophils Relative: 0 %
HCT: 34.3 % — ABNORMAL LOW (ref 36.0–46.0)
Hemoglobin: 10.2 g/dL — ABNORMAL LOW (ref 12.0–15.0)
Immature Granulocytes: 1 %
Lymphocytes Relative: 12 %
Lymphs Abs: 1 10*3/uL (ref 0.7–4.0)
MCH: 21 pg — ABNORMAL LOW (ref 26.0–34.0)
MCHC: 29.7 g/dL — ABNORMAL LOW (ref 30.0–36.0)
MCV: 70.6 fL — ABNORMAL LOW (ref 80.0–100.0)
Monocytes Absolute: 0.3 10*3/uL (ref 0.1–1.0)
Monocytes Relative: 4 %
Neutro Abs: 6.8 10*3/uL (ref 1.7–7.7)
Neutrophils Relative %: 82 %
Platelets: 243 10*3/uL (ref 150–400)
RBC: 4.86 MIL/uL (ref 3.87–5.11)
RDW: 16.9 % — ABNORMAL HIGH (ref 11.5–15.5)
WBC: 8.2 10*3/uL (ref 4.0–10.5)
nRBC: 0 % (ref 0.0–0.2)

## 2021-12-27 LAB — BLOOD GAS, VENOUS
Acid-Base Excess: 2.6 mmol/L — ABNORMAL HIGH (ref 0.0–2.0)
Bicarbonate: 27.2 mmol/L (ref 20.0–28.0)
O2 Saturation: 65.4 %
Patient temperature: 37
pCO2, Ven: 41 mmHg — ABNORMAL LOW (ref 44–60)
pH, Ven: 7.43 (ref 7.25–7.43)
pO2, Ven: 40 mmHg (ref 32–45)

## 2021-12-27 LAB — URINE DRUG SCREEN, QUALITATIVE (ARMC ONLY)
Amphetamines, Ur Screen: NOT DETECTED
Barbiturates, Ur Screen: NOT DETECTED
Benzodiazepine, Ur Scrn: NOT DETECTED
Cannabinoid 50 Ng, Ur ~~LOC~~: NOT DETECTED
Cocaine Metabolite,Ur ~~LOC~~: NOT DETECTED
MDMA (Ecstasy)Ur Screen: NOT DETECTED
Methadone Scn, Ur: NOT DETECTED
Opiate, Ur Screen: NOT DETECTED
Phencyclidine (PCP) Ur S: NOT DETECTED
Tricyclic, Ur Screen: POSITIVE — AB

## 2021-12-27 LAB — URINALYSIS, ROUTINE W REFLEX MICROSCOPIC
Bilirubin Urine: NEGATIVE
Glucose, UA: NEGATIVE mg/dL
Hgb urine dipstick: NEGATIVE
Ketones, ur: NEGATIVE mg/dL
Nitrite: POSITIVE — AB
Protein, ur: 30 mg/dL — AB
Specific Gravity, Urine: 1.043 — ABNORMAL HIGH (ref 1.005–1.030)
pH: 5 (ref 5.0–8.0)

## 2021-12-27 LAB — COMPREHENSIVE METABOLIC PANEL
ALT: 12 U/L (ref 0–44)
AST: 19 U/L (ref 15–41)
Albumin: 3.6 g/dL (ref 3.5–5.0)
Alkaline Phosphatase: 88 U/L (ref 38–126)
Anion gap: 10 (ref 5–15)
BUN: 12 mg/dL (ref 6–20)
CO2: 24 mmol/L (ref 22–32)
Calcium: 9.2 mg/dL (ref 8.9–10.3)
Chloride: 105 mmol/L (ref 98–111)
Creatinine, Ser: 0.84 mg/dL (ref 0.44–1.00)
GFR, Estimated: >60 mL/min (ref >60)
Glucose, Bld: 185 mg/dL — ABNORMAL HIGH (ref 70–99)
Potassium: 4.2 mmol/L (ref 3.5–5.1)
Sodium: 139 mmol/L (ref 135–145)
Total Bilirubin: 0.6 mg/dL (ref 0.3–1.2)
Total Protein: 8.1 g/dL (ref 6.5–8.1)

## 2021-12-27 LAB — TROPONIN I (HIGH SENSITIVITY)
Troponin I (High Sensitivity): 3 ng/L (ref <18)
Troponin I (High Sensitivity): 3 ng/L (ref <18)

## 2021-12-27 LAB — LIPASE, BLOOD: Lipase: 33 U/L (ref 11–51)

## 2021-12-27 LAB — ETHANOL: Alcohol, Ethyl (B): <10 mg/dL (ref <10)

## 2021-12-27 LAB — GLUCOSE, CAPILLARY
Glucose-Capillary: 119 mg/dL — ABNORMAL HIGH (ref 70–99)
Glucose-Capillary: 122 mg/dL — ABNORMAL HIGH (ref 70–99)

## 2021-12-27 LAB — BRAIN NATRIURETIC PEPTIDE: B Natriuretic Peptide: 75.2 pg/mL (ref 0.0–100.0)

## 2021-12-27 MED ORDER — APIXABAN 5 MG PO TABS
5.0000 mg | ORAL_TABLET | Freq: Two times a day (BID) | ORAL | Status: DC
  Administered 2021-12-27 – 2021-12-29 (×5): 5 mg via ORAL
  Filled 2021-12-27 (×5): qty 1

## 2021-12-27 MED ORDER — VITAMIN D 25 MCG (1000 UNIT) PO TABS
5000.0000 [IU] | ORAL_TABLET | Freq: Every day | ORAL | Status: DC
  Administered 2021-12-28 – 2021-12-29 (×2): 5000 [IU] via ORAL
  Filled 2021-12-27 (×2): qty 5

## 2021-12-27 MED ORDER — ONDANSETRON HCL 4 MG/2ML IJ SOLN
4.0000 mg | Freq: Three times a day (TID) | INTRAMUSCULAR | Status: DC | PRN
  Administered 2021-12-27 – 2021-12-28 (×2): 4 mg via INTRAVENOUS
  Filled 2021-12-27 (×4): qty 2

## 2021-12-27 MED ORDER — HYDROXYCHLOROQUINE SULFATE 200 MG PO TABS: 200.0000 mg | ORAL_TABLET | Freq: Two times a day (BID) | ORAL | Status: DC

## 2021-12-27 MED ORDER — OYSTER SHELL CALCIUM/D3 500-5 MG-MCG PO TABS
1.0000 | ORAL_TABLET | Freq: Every day | ORAL | Status: DC
  Administered 2021-12-28 – 2021-12-29 (×2): 1 via ORAL
  Filled 2021-12-27 (×2): qty 1

## 2021-12-27 MED ORDER — SODIUM CHLORIDE 0.9 % IV SOLN
12.5000 mg | Freq: Four times a day (QID) | INTRAVENOUS | Status: DC | PRN
  Administered 2021-12-27: 12.5 mg via INTRAVENOUS
  Filled 2021-12-27: qty 0.5

## 2021-12-27 MED ORDER — LORAZEPAM 2 MG/ML IJ SOLN
1.0000 mg | Freq: Once | INTRAMUSCULAR | Status: AC
  Administered 2021-12-27: 1 mg via INTRAVENOUS
  Filled 2021-12-27: qty 1

## 2021-12-27 MED ORDER — CIPROFLOXACIN HCL 500 MG PO TABS
500.0000 mg | ORAL_TABLET | Freq: Two times a day (BID) | ORAL | Status: DC
  Administered 2021-12-27 – 2021-12-29 (×4): 500 mg via ORAL
  Filled 2021-12-27 (×4): qty 1

## 2021-12-27 MED ORDER — ACETAMINOPHEN 325 MG PO TABS: 650.0000 mg | ORAL_TABLET | Freq: Four times a day (QID) | ORAL | Status: DC | PRN

## 2021-12-27 MED ORDER — QUETIAPINE FUMARATE 300 MG PO TABS
300.0000 mg | ORAL_TABLET | Freq: Every day | ORAL | Status: DC
  Administered 2021-12-27 – 2021-12-28 (×2): 300 mg via ORAL
  Filled 2021-12-27 (×2): qty 1

## 2021-12-27 MED ORDER — HYDRALAZINE HCL 20 MG/ML IJ SOLN: 5.0000 mg | INTRAMUSCULAR | Status: DC | PRN

## 2021-12-27 MED ORDER — ALBUTEROL SULFATE (2.5 MG/3ML) 0.083% IN NEBU
2.5000 mg | INHALATION_SOLUTION | RESPIRATORY_TRACT | Status: DC | PRN
  Filled 2021-12-27: qty 3

## 2021-12-27 MED ORDER — FLUOXETINE HCL 20 MG PO CAPS
40.0000 mg | ORAL_CAPSULE | Freq: Every day | ORAL | Status: DC
  Administered 2021-12-27 – 2021-12-29 (×3): 40 mg via ORAL
  Filled 2021-12-27 (×3): qty 2

## 2021-12-27 MED ORDER — QUETIAPINE FUMARATE 25 MG PO TABS
25.0000 mg | ORAL_TABLET | Freq: Two times a day (BID) | ORAL | Status: DC
  Administered 2021-12-27 – 2021-12-29 (×5): 25 mg via ORAL
  Filled 2021-12-27 (×5): qty 1

## 2021-12-27 MED ORDER — ZOLPIDEM TARTRATE 5 MG PO TABS
5.0000 mg | ORAL_TABLET | Freq: Every day | ORAL | Status: DC
  Administered 2021-12-27 – 2021-12-28 (×2): 5 mg via ORAL
  Filled 2021-12-27 (×2): qty 1

## 2021-12-27 MED ORDER — UMECLIDINIUM-VILANTEROL 62.5-25 MCG/ACT IN AEPB
1.0000 | INHALATION_SPRAY | Freq: Every day | RESPIRATORY_TRACT | Status: DC
Start: 2021-12-28 — End: 2021-12-29

## 2021-12-27 MED ORDER — BUDESONIDE 0.5 MG/2ML IN SUSP
0.5000 mg | Freq: Two times a day (BID) | RESPIRATORY_TRACT | Status: DC
  Administered 2021-12-28 – 2021-12-29 (×3): 0.5 mg via RESPIRATORY_TRACT
  Filled 2021-12-27 (×2): qty 2

## 2021-12-27 MED ORDER — OXYCODONE-ACETAMINOPHEN 5-325 MG PO TABS
1.0000 | ORAL_TABLET | Freq: Three times a day (TID) | ORAL | Status: DC | PRN
  Administered 2021-12-27 – 2021-12-28 (×3): 1 via ORAL
  Filled 2021-12-27 (×3): qty 1

## 2021-12-27 MED ORDER — ORAL CARE MOUTH RINSE: 15.0000 mL | OROMUCOSAL | Status: DC | PRN

## 2021-12-27 MED ORDER — METOPROLOL TARTRATE 25 MG PO TABS
25.0000 mg | ORAL_TABLET | Freq: Two times a day (BID) | ORAL | Status: DC
  Administered 2021-12-27 – 2021-12-28 (×4): 25 mg via ORAL
  Filled 2021-12-27 (×5): qty 1

## 2021-12-27 MED ORDER — INSULIN ASPART 100 UNIT/ML IJ SOLN
0.0000 [IU] | Freq: Three times a day (TID) | INTRAMUSCULAR | Status: DC
  Administered 2021-12-28 (×3): 1 [IU] via SUBCUTANEOUS
  Administered 2021-12-29: 2 [IU] via SUBCUTANEOUS
  Administered 2021-12-29: 1 [IU] via SUBCUTANEOUS
  Filled 2021-12-27 (×6): qty 1

## 2021-12-27 MED ORDER — POLYETHYLENE GLYCOL 3350 17 G PO PACK: 17.0000 g | PACK | Freq: Every day | ORAL | Status: DC | PRN

## 2021-12-27 MED ORDER — FOLIC ACID 1 MG PO TABS
1.0000 mg | ORAL_TABLET | Freq: Every day | ORAL | Status: DC
  Administered 2021-12-27 – 2021-12-29 (×3): 1 mg via ORAL
  Filled 2021-12-27 (×3): qty 1

## 2021-12-27 MED ORDER — CALCIUM CITRATE-VITAMIN D3 315-6.25 MG-MCG PO TABS: 1.0000 | ORAL_TABLET | Freq: Every day | ORAL | Status: DC

## 2021-12-27 MED ORDER — ARIPIPRAZOLE 5 MG PO TABS: 5.0000 mg | ORAL_TABLET | Freq: Every day | ORAL | Status: DC

## 2021-12-27 MED ORDER — IOHEXOL 300 MG/ML  SOLN
100.0000 mL | Freq: Once | INTRAMUSCULAR | Status: AC | PRN
  Administered 2021-12-27: 100 mL via INTRAVENOUS

## 2021-12-27 MED ORDER — ATORVASTATIN CALCIUM 20 MG PO TABS
80.0000 mg | ORAL_TABLET | Freq: Every day | ORAL | Status: DC
  Administered 2021-12-27 – 2021-12-29 (×3): 80 mg via ORAL
  Filled 2021-12-27 (×3): qty 4

## 2021-12-27 MED ORDER — BUDESONIDE 0.5 MG/2ML IN SUSP
2.5000 mg | Freq: Two times a day (BID) | RESPIRATORY_TRACT | Status: DC
Start: 2021-12-27 — End: 2021-12-27

## 2021-12-27 MED ORDER — DIPHENHYDRAMINE HCL 50 MG/ML IJ SOLN
50.0000 mg | Freq: Once | INTRAMUSCULAR | Status: AC
  Administered 2021-12-27: 50 mg via INTRAVENOUS
  Filled 2021-12-27: qty 1

## 2021-12-27 MED ORDER — LORAZEPAM 2 MG/ML IJ SOLN
1.0000 mg | Freq: Four times a day (QID) | INTRAMUSCULAR | Status: DC | PRN
  Administered 2021-12-27: 1 mg via INTRAVENOUS
  Filled 2021-12-27: qty 1

## 2021-12-27 MED ORDER — FAMOTIDINE 20 MG PO TABS
20.0000 mg | ORAL_TABLET | Freq: Every day | ORAL | Status: DC
  Administered 2021-12-27 – 2021-12-29 (×3): 20 mg via ORAL
  Filled 2021-12-27 (×3): qty 1

## 2021-12-27 MED ORDER — DIPHENHYDRAMINE HCL 50 MG/ML IJ SOLN: 25.0000 mg | Freq: Four times a day (QID) | INTRAMUSCULAR | Status: DC | PRN

## 2021-12-27 MED ORDER — PRAMIPEXOLE DIHYDROCHLORIDE 0.25 MG PO TABS
0.2500 mg | ORAL_TABLET | Freq: Every day | ORAL | Status: DC
  Administered 2021-12-27 – 2021-12-28 (×2): 0.25 mg via ORAL
  Filled 2021-12-27 (×2): qty 1

## 2021-12-27 MED ORDER — VITAMIN C 500 MG PO TABS
500.0000 mg | ORAL_TABLET | Freq: Every day | ORAL | Status: DC
  Administered 2021-12-28 – 2021-12-29 (×2): 500 mg via ORAL
  Filled 2021-12-27 (×2): qty 1

## 2021-12-27 MED ORDER — BUDESON-GLYCOPYRROL-FORMOTEROL 160-9-4.8 MCG/ACT IN AERO: 2.0000 | INHALATION_SPRAY | Freq: Every day | RESPIRATORY_TRACT | Status: DC

## 2021-12-27 MED ORDER — BUSPIRONE HCL 10 MG PO TABS
10.0000 mg | ORAL_TABLET | Freq: Two times a day (BID) | ORAL | Status: DC
  Administered 2021-12-27 – 2021-12-29 (×5): 10 mg via ORAL
  Filled 2021-12-27 (×5): qty 1

## 2021-12-27 MED ORDER — INSULIN ASPART 100 UNIT/ML IJ SOLN: 0.0000 [IU] | Freq: Every day | INTRAMUSCULAR | Status: DC

## 2021-12-27 MED ORDER — AMLODIPINE BESYLATE 10 MG PO TABS
10.0000 mg | ORAL_TABLET | Freq: Every day | ORAL | Status: DC
  Administered 2021-12-27 – 2021-12-28 (×2): 10 mg via ORAL
  Filled 2021-12-27 (×3): qty 1

## 2021-12-27 MED ORDER — CIPROFLOXACIN HCL 500 MG PO TABS: 500.0000 mg | ORAL_TABLET | Freq: Two times a day (BID) | ORAL | Status: DC

## 2021-12-27 MED ORDER — DM-GUAIFENESIN ER 30-600 MG PO TB12: 1.0000 | ORAL_TABLET | Freq: Two times a day (BID) | ORAL | Status: DC | PRN

## 2021-12-27 MED ORDER — ZINC SULFATE 220 (50 ZN) MG PO CAPS
220.0000 mg | ORAL_CAPSULE | Freq: Every day | ORAL | Status: DC
  Administered 2021-12-27 – 2021-12-28 (×2): 220 mg via ORAL
  Filled 2021-12-27 (×2): qty 1

## 2021-12-27 MED ORDER — PREGABALIN 50 MG PO CAPS
100.0000 mg | ORAL_CAPSULE | Freq: Three times a day (TID) | ORAL | Status: DC
  Administered 2021-12-27 – 2021-12-29 (×7): 100 mg via ORAL
  Filled 2021-12-27 (×7): qty 2

## 2021-12-27 MED ORDER — BUDESONIDE 0.5 MG/2ML IN SUSP: 2.5000 mg | Freq: Two times a day (BID) | RESPIRATORY_TRACT | Status: DC

## 2021-12-27 MED ORDER — BUDESONIDE 0.5 MG/2ML IN SUSP
2.0000 mg | Freq: Two times a day (BID) | RESPIRATORY_TRACT | Status: DC
Start: 2021-12-28 — End: 2021-12-27

## 2021-12-27 NOTE — Assessment & Plan Note (Signed)
Lipitor 

## 2021-12-27 NOTE — ED Notes (Signed)
Pt increasingly anxious, stating "I can't do it" and crying. Renato Gails helped this RN reposition pt in bed. Renato Gails and mother comforting pt. Wrote to provider to ask for PRN anxiolytic.

## 2021-12-27 NOTE — Consult Note (Signed)
[facility] Face-to-Face Psychiatry Consult   Reason for Consult:  anxiety Referring Physician:  Dr Charna Archer Patient Identification: [name] [name] MRN:  [id number] Principal Diagnosis: Acute metabolic encephalopathy Diagnosis:  Principal Problem:   Acute metabolic encephalopathy Active Problems:   COPD (chronic obstructive pulmonary disease) ([place])   Essential (primary) hypertension   Chronic diastolic CHF (congestive heart failure) (HCC)   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[employer])   Rheumatoid arthritis (HCC)   Hypothyroidism   Abdominal pain   HLD (hyperlipidemia)   PAF (paroxysmal atrial fibrillation) ([place])   Total Time spent with patient: 45 minutes  Subjective:   [name] [name] is a 61 y.o. female patient admitted with encephalopathy.  HPI:  61 yo female with multiple medical issues and chronic pain.  She recently started Reglan and became increasingly more agitated and anxious.  On assessment, she is complaining of restless legs and anxiety.  She reported her anxiety became worse after starting the Reglan.  No suicidal ideations or psychosis, Seroquel adjusted to assist with her anxiety.  Her stepbrother met with this provider in the hallway and stated he does not feel she is a danger to herself just very frustrated that her body is giving out.  Past Psychiatric History: depression, anxiety  Risk to Self:  none Risk to Others:  none Prior Inpatient Therapy:  none Prior Outpatient Therapy:  Dr Weber Cooks  Past Medical History:  Past Medical History:  Diagnosis Date   Abdominal wall hernia 01/29/2013   Anxiety    Arthritis    Rheumatoid   C. difficile colitis    Chronic diastolic heart failure ([place])    COVID-19 03/23/2019   Diagnosed at [facility] (send-out) on 03/23/2019   Depression    Diabetes mellitus    states no meds or diet restrictions  at present   Diastolic CHF (HCC)    Esophagitis    Fluid retention    GERD (gastroesophageal reflux disease)     Hiatal hernia    Hypertension    Hypokalemia due to loss of potassium 10/21/2015   Overview:  Associated with 3 weeks of diarrhea  And QT prolongation.   Hypothyroidism    IBS (irritable bowel syndrome)    Moderate episode of recurrent major depressive disorder ([place]) 06/03/2004   Morbid obesity (HCC)    MRSA (methicillin resistant Staphylococcus aureus) infection 11/2017   left inner thigh abcess   Neurogenic bladder    has pacemaker   Neuropathy    Obesity    Panic attacks    Pneumonia due to COVID-19 virus    Rheumatoid arthritis ([place])    Sleep apnea    STATES SEVERE, CANT TOLERATE MASK- LAST STUDY YEARS AGO    Past Surgical History:  Procedure Laterality Date   ABDOMINAL HYSTERECTOMY     CHOLECYSTECTOMY     DG GREAT TOE RIGHT FOOT  02/23/2018   EYE SURGERY     bilateral cataract extraction with IOL   FOOT SURGERY Left 05/13/2021   [facility]   HERNIA REPAIR     ventral hernia with strangulation   IRRIGATION AND DEBRIDEMENT FOOT Left 01/11/2020   Procedure: IRRIGATION AND DEBRIDEMENT FOOT;  Surgeon: Samara Deist, DPM;  Location: [facility] ORS;  Service: Podiatry;  Laterality: Left;   LAPAROSCOPIC GASTRIC BANDING  03/20/2007   TEE WITHOUT CARDIOVERSION N/A 07/16/2019   Procedure: TRANSESOPHAGEAL ECHOCARDIOGRAM (TEE);  Surgeon: Kate Sable, MD;  Location: [facility] ORS;  Service: Cardiovascular;  Laterality: N/A;   TONSILLECTOMY     TUBAL LIGATION  Family History:  Family History  Problem Relation Age of Onset   Heart failure Father    Bipolar disorder Father    Alcohol abuse Father    Anxiety disorder Father    Depression Father    Heart disease Brother    Heart attack Brother 45       MI s/p stents placed   Anxiety disorder Sister    Depression Sister    Anxiety disorder Sister    Depression Sister    Bipolar disorder Sister    Alcohol abuse Sister    Drug abuse Sister    Heart attack Brother    Family Psychiatric  History: see above Social History:  Social  History   Substance and Sexual Activity  Alcohol Use No     Social History   Substance and Sexual Activity  Drug Use No    Social History   Socioeconomic History   Marital status: Divorced    Spouse name: Not on file   Number of children: Not on file   Years of education: Not on file   Highest education level: Not on file  Occupational History   Not on file  Tobacco Use   Smoking status: Former    Packs/day: 2.00    Years: 27.00    Total pack years: 54.00    Types: Cigarettes    Quit date: 07/30/1999    Years since quitting: 22.4   Smokeless tobacco: Never   Tobacco comments:    quit in 2001 2-2.5 a day  Vaping Use   Vaping Use: Never used  Substance and Sexual Activity   Alcohol use: No   Drug use: No   Sexual activity: Not Currently  Other Topics Concern   Not on file  Social History Narrative   Not on file   Social Determinants of Health   Financial Resource Strain: Not on file  Food Insecurity: Not on file  Transportation Needs: Not on file  Physical Activity: Not on file  Stress: Not on file  Social Connections: Not on file   Additional Social History:    Allergies:   Allergies  Allergen Reactions   Cephalexin Hives   Codeine Palpitations, Nausea Only, Nausea And Vomiting, Rash and Shortness Of Breath    "makes heart fly, she gets flushed and passes out"   Doxycycline Rash   Propoxyphene Rash and Shortness Of Breath    Increase heart rate   Sulfa Antibiotics Palpitations, Nausea Only, Shortness Of Breath and Hives    "makes heart fly, she gets flushed and passes out"   Clindamycin Rash    Tongue swelling, oral sores, and Mouth rash   Lovenox [Enoxaparin Sodium] Hives   Hydrocodone Nausea And Vomiting    Hear racing & breaks out into a cold sweat.   Meropenem Rash    Erythematous, hot, pruritic rash over arms, chest, back, abdomen, and face occurred at the end of meropenem infusion on 02/22/18    Labs:  Results for orders placed or  performed during the hospital encounter of 12/27/21 (from the past 48 hour(s))  Brain natriuretic peptide     Status: None   Collection Time: 12/27/21  9:40 AM  Result Value Ref Range   B Natriuretic Peptide 75.2 0.0 - 100.0 pg/mL    Comment: Performed at [facility], [address], [place], [place] [address]  CBC with Differential     Status: Abnormal   Collection Time: 12/27/21  9:41 AM  Result Value Ref  Range   WBC 8.2 4.0 - 10.5 K/uL   RBC 4.86 3.87 - 5.11 MIL/uL   Hemoglobin 10.2 (L) 12.0 - 15.0 g/dL   HCT 34.3 (L) 36.0 - 46.0 %   MCV 70.6 (L) 80.0 - 100.0 fL   MCH 21.0 (L) 26.0 - 34.0 pg   MCHC 29.7 (L) 30.0 - 36.0 g/dL   RDW 16.9 (H) 11.5 - 15.5 %   Platelets 243 150 - 400 K/uL   nRBC 0.0 0.0 - 0.2 %   Neutrophils Relative % 82 %   Neutro Abs 6.8 1.7 - 7.7 K/uL   Lymphocytes Relative 12 %   Lymphs Abs 1.0 0.7 - 4.0 K/uL   Monocytes Relative 4 %   Monocytes Absolute 0.3 0.1 - 1.0 K/uL   Eosinophils Relative 0 %   Eosinophils Absolute 0.0 0.0 - 0.5 K/uL   Basophils Relative 1 %   Basophils Absolute 0.1 0.0 - 0.1 K/uL   Immature Granulocytes 1 %   Abs Immature Granulocytes 0.04 0.00 - 0.07 K/uL    Comment: Performed at [facility], [address] [place], [place] [address]  Comprehensive metabolic panel     Status: Abnormal   Collection Time: 12/27/21  9:41 AM  Result Value Ref Range   Sodium 139 135 - 145 mmol/L   Potassium 4.2 3.5 - 5.1 mmol/L   Chloride 105 98 - 111 mmol/L   CO2 24 22 - 32 mmol/L   Glucose, Bld 185 (H) 70 - 99 mg/dL    Comment: Glucose reference range applies only to samples taken after fasting for at least 8 hours.   BUN 12 6 - 20 mg/dL   Creatinine, Ser 0.84 0.44 - 1.00 mg/dL   Calcium 9.2 8.9 - 10.3 mg/dL   Total Protein 8.1 6.5 - 8.1 g/dL   Albumin 3.6 3.5 - 5.0 g/dL   AST 19 15 - 41 U/L   ALT 12 0 - 44 U/L   Alkaline Phosphatase 88 38 - 126 U/L   Total Bilirubin 0.6 0.3 - 1.2 mg/dL   GFR, Estimated >60 >60 mL/min     Comment: (NOTE) Calculated using the CKD-EPI Creatinine Equation (2021)    Anion gap 10 5 - 15    Comment: Performed at [facility], [address] [place], [place] [address]  Ethanol     Status: None   Collection Time: 12/27/21  9:41 AM  Result Value Ref Range   Alcohol, Ethyl (B) <10 <10 mg/dL    Comment: (NOTE) Lowest detectable limit for serum alcohol is 10 mg/dL.  For medical purposes only. Performed at [facility], [address] [place],  [address]   Urinalysis, Routine w reflex microscopic Urine, Clean Catch     Status: Abnormal   Collection Time: 12/27/21  9:41 AM  Result Value Ref Range   Color, Urine YELLOW (A) YELLOW   APPearance HAZY (A) CLEAR   Specific Gravity, Urine 1.043 (H) 1.005 - 1.030   pH 5.0 5.0 - 8.0   Glucose, UA NEGATIVE NEGATIVE mg/dL   Hgb urine dipstick NEGATIVE NEGATIVE   Bilirubin Urine NEGATIVE NEGATIVE   Ketones, ur NEGATIVE NEGATIVE mg/dL   Protein, ur 30 (A) NEGATIVE mg/dL   Nitrite POSITIVE (A) NEGATIVE   Leukocytes,Ua LARGE (A) NEGATIVE   RBC / HPF 0-5 0 - 5 RBC/hpf   WBC, UA 11-20 0 - 5 WBC/hpf   Bacteria, UA RARE (A) NONE SEEN   Squamous Epithelial / LPF 0-5  0 - 5    Comment: Performed at [facility], [place], [place] [address]  Troponin I (High Sensitivity)     Status: None   Collection Time: 12/27/21  9:41 AM  Result Value Ref Range   Troponin I (High Sensitivity) 3 <18 ng/L    Comment: (NOTE) Elevated high sensitivity troponin I (hsTnI) values and significant  changes across serial measurements may suggest ACS but many other  chronic and acute conditions are known to elevate hsTnI results.  Refer to the "Links" section for chest pain algorithms and additional  guidance. Performed at [facility], [address] [place], [place] [address]   Lipase, blood     Status: None   Collection Time: 12/27/21  9:41 AM  Result Value Ref Range   Lipase 33 11 - 51 U/L     Comment: Performed at [facility], [place], [place] [address]  Troponin I (High Sensitivity)     Status: None   Collection Time: 12/27/21 11:30 AM  Result Value Ref Range   Troponin I (High Sensitivity) 3 <18 ng/L    Comment: (NOTE) Elevated high sensitivity troponin I (hsTnI) values and significant  changes across serial measurements may suggest ACS but many other  chronic and acute conditions are known to elevate hsTnI results.  Refer to the "Links" section for chest pain algorithms and additional  guidance. Performed at [facility], [address] [place], [place] [address]   Blood gas, venous     Status: Abnormal   Collection Time: 12/27/21  1:04 PM  Result Value Ref Range   pH, Ven 7.43 7.25 - 7.43   pCO2, Ven 41 (L) 44 - 60 mmHg   pO2, Ven 40 32 - 45 mmHg   Bicarbonate 27.2 20.0 - 28.0 mmol/L   Acid-Base Excess 2.6 (H) 0.0 - 2.0 mmol/L   O2 Saturation 65.4 %   Patient temperature 37.0    Collection site VEIN     Comment: Performed at [facility], [address], [place], [place] [address]    Current Facility-Administered Medications  Medication Dose Route Frequency Provider Last Rate Last Admin   acetaminophen (TYLENOL) tablet 650 mg  650 mg Oral Q6H PRN Ivor Costa, MD       albuterol (PROVENTIL) (2.5 MG/3ML) 0.083% nebulizer solution 2.5 mg  2.5 mg Nebulization Q4H PRN Ivor Costa, MD       amLODipine (NORVASC) tablet 10 mg  10 mg Oral Daily Ivor Costa, MD       apixaban Arne Cleveland) tablet 5 mg  5 mg Oral BID Ivor Costa, MD       ascorbic acid (VITAMIN C) tablet 500 mg  500 mg Oral Daily Ivor Costa, MD       atorvastatin (LIPITOR) tablet 80 mg  80 mg Oral Daily Ivor Costa, MD       [START ON 12/28/2021] budesonide (PULMICORT) nebulizer solution 2 mg  2 mg Nebulization BID Ivor Costa, MD       busPIRone (BUSPAR) tablet 10 mg  10 mg Oral BID Ivor Costa, MD       calcium-vitamin D (OSCAL WITH D) 500-5 MG-MCG per tablet 1  tablet  1 tablet Oral Daily Ivor Costa, MD       cholecalciferol (VITAMIN D3) 25 MCG (1000 UNIT) tablet 5,000 Units  5,000 Units Oral Daily Ivor Costa, MD       dextromethorphan-guaiFENesin (MUCINEX DM) 30-600 MG per 12 hr tablet  1 tablet  1 tablet Oral BID PRN Ivor Costa, MD       diphenhydrAMINE (BENADRYL) injection 25 mg  25 mg Intravenous Q6H PRN Ivor Costa, MD       famotidine (PEPCID) tablet 20 mg  20 mg Oral Daily Ivor Costa, MD       FLUoxetine (PROZAC) capsule 40 mg  40 mg Oral Daily Ivor Costa, MD       folic acid (FOLVITE) tablet 1 mg  1 mg Oral Daily Ivor Costa, MD       hydrALAZINE (APRESOLINE) injection 5 mg  5 mg Intravenous Q2H PRN Ivor Costa, MD       insulin aspart (novoLOG) injection 0-5 Units  0-5 Units Subcutaneous QHS Ivor Costa, MD       insulin aspart (novoLOG) injection 0-9 Units  0-9 Units Subcutaneous TID WC Ivor Costa, MD       LORazepam (ATIVAN) injection 1 mg  1 mg Intravenous Q6H PRN Ivor Costa, MD   1 mg at 12/27/21 1317   metoprolol tartrate (LOPRESSOR) tablet 25 mg  25 mg Oral BID Ivor Costa, MD       ondansetron [facility]) injection 4 mg  4 mg Intravenous Q8H PRN Ivor Costa, MD       oxyCODONE-acetaminophen (PERCOCET/ROXICET) 5-325 MG per tablet 1 tablet  1 tablet Oral Q8H PRN Ivor Costa, MD       polyethylene glycol (MIRALAX / GLYCOLAX) packet 17 g  17 g Oral Daily PRN Ivor Costa, MD       pramipexole (MIRAPEX) tablet 0.25 mg  0.25 mg Oral QHS Ivor Costa, MD       pregabalin (LYRICA) capsule 100 mg  100 mg Oral TID Ivor Costa, MD       QUEtiapine (SEROQUEL) tablet 25 mg  25 mg Oral BID Patrecia Pour, NP       QUEtiapine (SEROQUEL) tablet 300 mg  300 mg Oral QHS Ivor Costa, MD       [START ON 12/28/2021] umeclidinium-vilanterol (ANORO ELLIPTA) 62.5-25 MCG/ACT 1 puff  1 puff Inhalation Daily Ivor Costa, MD       zinc sulfate capsule 220 mg  220 mg Oral QHS Ivor Costa, MD       zolpidem (AMBIEN) tablet 5 mg  5 mg Oral QHS Ivor Costa, MD       Current Outpatient  Medications  Medication Sig Dispense Refill   albuterol (PROVENTIL) (2.5 MG/3ML) 0.083% nebulizer solution Take 3 mLs (2.5 mg total) by nebulization every 6 (six) hours as needed for wheezing or shortness of breath. 75 mL 6   amLODipine (NORVASC) 10 MG tablet Take 10 mg by mouth daily.     ANORO ELLIPTA 62.5-25 MCG/ACT AEPB 1 puff daily.     ARIPiprazole (ABILIFY) 5 MG tablet Take 1 tablet (5 mg total) by mouth daily. 30 tablet 5   ascorbic acid (VITAMIN C) 500 MG tablet Take 1 tablet (500 mg total) by mouth daily. 30 tablet 0   atorvastatin (LIPITOR) 80 MG tablet SMARTSIG:1 Tablet(s) By Mouth Every Evening     Budeson-Glycopyrrol-Formoterol (BREZTRI AEROSPHERE) 160-9-4.8 MCG/ACT AERO Inhale 2 puffs into the lungs in the morning and at bedtime. 5.9 g 0   busPIRone (BUSPAR) 10 MG tablet Take 1 tablet (10 mg total) by mouth 2 (two) times daily. 60 tablet 5   calcium citrate-vitamin D 500-500 MG-UNIT chewable tablet Chew 0.5 tablets by mouth daily. 30 tablet 0   Calcium Citrate-Vitamin D3 (GNP CALCIUM CITRATE+D MAXIMUM) 315-6.25  MG-MCG TABS Take 1 tablet by mouth daily.     Cholecalciferol (VITAMIN D) 125 MCG (5000 UT) CAPS Take 1 capsule by mouth daily.     ELIQUIS 5 MG TABS tablet TAKE 1 TABLET BY MOUTH TWICE DAILY 56 tablet 0   famotidine (PEPCID) 20 MG tablet Take 1 tablet (20 mg total) by mouth daily. 30 tablet 0   FLUoxetine (PROZAC) 40 MG capsule Take 1 capsule (40 mg total) by mouth daily. 30 capsule 5   folic acid (FOLVITE) 1 MG tablet Take 1 mg by mouth daily.      hydroxychloroquine (PLAQUENIL) 200 MG tablet Take 200 mg by mouth 2 (two) times daily.     levothyroxine (SYNTHROID, LEVOTHROID) 88 MCG tablet Take 88 mcg by mouth daily before breakfast.     metFORMIN (GLUCOPHAGE-XR) 500 MG 24 hr tablet Take 500 mg by mouth daily.     metoCLOPramide (REGLAN) 5 MG/5ML solution Take by mouth.     metoprolol tartrate (LOPRESSOR) 25 MG tablet Take 25 mg by mouth 2 (two) times daily.      oxyCODONE-acetaminophen (PERCOCET/ROXICET) 5-325 MG tablet Take 1 tablet by mouth every 8 (eight) hours as needed for severe pain.     polyethylene glycol (MIRALAX / GLYCOLAX) 17 g packet Take 17 g by mouth daily as needed for mild constipation. 14 each 0   pramipexole (MIRAPEX) 0.125 MG tablet Take 0.25 mg by mouth at bedtime.      pregabalin (LYRICA) 100 MG capsule Take 1 capsule (100 mg total) by mouth 3 (three) times daily. 90 capsule 5   QUEtiapine (SEROQUEL) 300 MG tablet TAKE ONE TABLET BY MOUTH AT BEDTIME. 30 tablet 5   TRADJENTA 5 MG TABS tablet Take 5 mg by mouth daily.     zinc sulfate 220 (50 Zn) MG capsule Take 220 mg by mouth at bedtime.      zolpidem (AMBIEN) 5 MG tablet Take 1 tablet (5 mg total) by mouth at bedtime. 30 tablet 5   acetaminophen (TYLENOL) 325 MG tablet Take 2 tablets (650 mg total) by mouth every 6 (six) hours as needed for mild pain or moderate pain (or Fever >/= 101). 20 tablet 0   [START ON 01/14/2022] buprenorphine (BUTRANS) 10 MCG/HR PTWK Place 1 patch onto the skin once a week for 28 days. (Patient not taking: Reported on 12/27/2021) 4 patch 0   buprenorphine (BUTRANS) 7.5 MCG/HR Place 1 patch onto the skin once a week for 28 days. (Patient not taking: Reported on 12/27/2021) 4 patch 0   FLUoxetine (PROZAC) 20 MG capsule TAKE (1) CAPSULE BY MOUTH ONCE DAILY. (Patient not taking: Reported on 12/27/2021) 30 capsule 5   furosemide (LASIX) 20 MG tablet TAKE (1) TABLET BY MOUTH DAILY AS NEEDED. (Patient not taking: Reported on 12/27/2021) 30 tablet 0   Insulin Degludec 100 UNIT/ML SOLN Inject 10 Units into the skin at bedtime. (Patient not taking: Reported on 12/27/2021)     methotrexate 2.5 MG tablet Take 4 tablets by mouth once a week. (Patient not taking: Reported on 12/27/2021)     mupirocin ointment (BACTROBAN) 2 % Apply 1 application topically 2 (two) times daily. Intranasal - to affected area. (Patient not taking: Reported on 12/27/2021) 22 g 0   QUEtiapine (SEROQUEL)  300 MG tablet Take 1 tablet (300 mg total) by mouth at bedtime. (Patient not taking: Reported on 12/27/2021) 30 tablet 5   solifenacin (VESICARE) 5 MG tablet Take 5 mg by mouth daily. (Patient not taking: Reported on  12/27/2021)     zolpidem (AMBIEN) 10 MG tablet Take 1 tablet (10 mg total) by mouth at bedtime. (Patient not taking: Reported on 12/27/2021) 30 tablet 5    Musculoskeletal: Strength & Muscle Tone: decreased Gait & Station:  did not witness Patient leans: N/A            Psychiatric Specialty Exam:  Presentation  General Appearance: No data recorded Eye Contact:No data recorded Speech:No data recorded Speech Volume:No data recorded Handedness:No data recorded  Mood and Affect  Mood:No data recorded Affect:No data recorded  Thought Process  Thought Processes:No data recorded Descriptions of Associations:No data recorded Orientation:No data recorded Thought Content:No data recorded History of Schizophrenia/Schizoaffective disorder:No data recorded Duration of Psychotic Symptoms:No data recorded Hallucinations:No data recorded Ideas of Reference:No data recorded Suicidal Thoughts:No data recorded Homicidal Thoughts:No data recorded  Sensorium  Memory:No data recorded Judgment:No data recorded Insight:No data recorded  Executive Functions  Concentration:No data recorded Attention Span:No data recorded Recall:No data recorded Fund of Knowledge:No data recorded Language:No data recorded  Psychomotor Activity  Psychomotor Activity:No data recorded  Assets  Assets:No data recorded  Sleep  Sleep:No data recorded  Physical Exam: Physical Exam Vitals and nursing note reviewed.  Constitutional:      Appearance: Normal appearance.  HENT:     Head: Normocephalic.     Nose: Nose normal.  Pulmonary:     Effort: Pulmonary effort is normal.  Musculoskeletal:     Cervical back: Normal range of motion.  Neurological:     General: No focal deficit  present.     Mental Status: She is alert and oriented to person, place, and time.  Psychiatric:        Attention and Perception: She is inattentive.        Mood and Affect: Mood is anxious and depressed.        Speech: Speech normal.        Behavior: Behavior normal. Behavior is cooperative.        Thought Content: Thought content normal.        Cognition and Memory: Cognition and memory normal.        Judgment: Judgment normal.    Review of Systems  Constitutional:  Positive for malaise/fatigue.  HENT: Negative.    Eyes: Negative.   Respiratory: Negative.    Cardiovascular:  Positive for leg swelling.  Gastrointestinal:  Positive for abdominal pain.  Genitourinary:  Positive for frequency.  Musculoskeletal:  Positive for joint pain and myalgias.  Skin: Negative.   Neurological:  Positive for sensory change.  Endo/Heme/Allergies: Negative.   Psychiatric/Behavioral:  Positive for depression. The patient is nervous/anxious.    Blood pressure (!) 149/87, pulse 78, temperature 98.2 F (36.8 C), temperature source Oral, resp. rate 16, last menstrual period 04/20/2001, SpO2 96 %. There is no height or weight on file to calculate BMI.  Treatment Plan Summary: Major depressive disorder, moderate: Prozac 40 mg daily  General anxiety disorder: Seroquel 25 mg BID Seroquel 300 mg daily at bedtime  Disposition: No evidence of imminent risk to self or others at present.   Patient does not meet criteria for psychiatric inpatient admission.  Waylan Boga, NP 12/27/2021 1:56 PM

## 2021-12-27 NOTE — ED Notes (Signed)
Dr Jessup at bedside 

## 2021-12-27 NOTE — Assessment & Plan Note (Signed)
Seroquel 300 mg daily at bedtime Prozac 40 mg daily

## 2021-12-27 NOTE — ED Notes (Signed)
Pharmacy tech at bedside 

## 2021-12-27 NOTE — ED Notes (Signed)
Hospitalist saw pt at bedside. Pt is alert and anxious. Mother at bedside.

## 2021-12-27 NOTE — ED Notes (Signed)
Pt sleeping at this time.

## 2021-12-27 NOTE — ED Notes (Signed)
Pt attempting to get out of bed. Mom at bedside. Pt instructed to stay in bed and that we are waiting for doctor orders. Pt states ok and repositioned back in bed. Pt given blanket.

## 2021-12-27 NOTE — ED Notes (Signed)
Pt is extremely anxious, keeps stating "I just don't know what to do!". Pt was at end of bed. CN helped this RN reposition pt in bed, 1mg  ativan given. Mother at bedside. Lights dimmed.

## 2021-12-27 NOTE — ED Provider Notes (Signed)
[facility] Provider Note    Event Date/Time   First MD Initiated Contact with Patient 12/27/21 0932     (approximate)   History   Chief Complaint Psychiatric Evaluation   HPI  [name] [name] is a 61 y.o. female with past medical history of hypertension, diabetes, atrial fibrillation on Eliquis, CHF, COPD, rheumatoid arthritis, bipolar disorder, and chronic pain syndrome who presents to the ED for altered mental status.  History is limited due to patient's confusion and majority of history is obtained from EMS.  They state that patient has been noted by family to be increasingly confused over the past 2 days.  She apparently has been dealing with nausea and vomiting recently, was prescribed Reglan to be taken 4 times daily as needed, but family reported she took an extra dose 2 days ago.  EMS reports that patient able to answer questions but making repetitive statements like "I cannot do this" and "please do not do this."  She is unable to voice any specific complaints, arrives tearful and slightly agitated.  Family had reported psychiatric history to EMS, but stated she has never been like this before.  They do not report any new medications other than Reglan.     Physical Exam   Triage Vital Signs: ED Triage Vitals  Enc Vitals Group     BP      Pulse      Resp      Temp      Temp src      SpO2      Weight      Height      Head Circumference      Peak Flow      Pain Score      Pain Loc      Pain Edu?      Excl. in GC?     Most recent vital signs: Vitals:   12/27/21 1139 12/27/21 1200  BP: (!) 148/84 (!) 149/87  Pulse: 78 76  Resp: (!) 21 (!) 22  Temp:    SpO2: 92% 93%    Constitutional: Alert and oriented to person and place, but not time or situation. Eyes: Conjunctivae are normal.  Pupils equal, round, and reactive to light bilaterally. Head: Atraumatic. Nose: No congestion/rhinnorhea. Mouth/Throat: Mucous membranes are moist.   Cardiovascular: Normal rate, regular rhythm. Grossly normal heart sounds.  2+ radial pulses bilaterally. Respiratory: Normal respiratory effort.  No retractions. Lungs CTAB. Gastrointestinal: Soft and tender to palpation diffusely with no rebound or guarding. No distention. Musculoskeletal: No lower extremity tenderness nor edema.  Neurologic:  Normal speech and language. No gross focal neurologic deficits are appreciated.    ED Results / Procedures / Treatments   Labs (all labs ordered are listed, but only abnormal results are displayed) Labs Reviewed  CBC WITH DIFFERENTIAL/PLATELET - Abnormal; Notable for the following components:      Result Value   Hemoglobin 10.2 (*)    HCT 34.3 (*)    MCV 70.6 (*)    MCH 21.0 (*)    MCHC 29.7 (*)    RDW 16.9 (*)    All other components within normal limits  COMPREHENSIVE METABOLIC PANEL - Abnormal; Notable for the following components:   Glucose, Bld 185 (*)    All other components within normal limits  ETHANOL  LIPASE, BLOOD  URINE DRUG SCREEN, QUALITATIVE ([facility] ONLY)  URINALYSIS, ROUTINE W REFLEX MICROSCOPIC  BLOOD GAS, VENOUS  TROPONIN I (HIGH SENSITIVITY)  TROPONIN I (HIGH SENSITIVITY)     EKG  ED ECG REPORT I, Chesley Noon, the attending physician, personally viewed and interpreted this ECG.   Date: 12/27/2021  EKG Time: 9:35  Rate: 76  Rhythm: normal sinus rhythm  Axis: Normal  Intervals:none  ST&T Change: None  RADIOLOGY CT head reviewed and interpreted by me with no hemorrhage or midline shift.  CT abdomen/pelvis reviewed and interpreted by me with no inflammatory changes, dilated bowel loops, or focal fluid collections.  PROCEDURES:  Critical Care performed: No  Procedures   MEDICATIONS ORDERED IN ED: Medications  diphenhydrAMINE (BENADRYL) injection 50 mg (50 mg Intravenous Given 12/27/21 0940)  iohexol (OMNIPAQUE) 300 MG/ML solution 100 mL (100 mLs Intravenous Contrast Given 12/27/21 1031)  LORazepam  (ATIVAN) injection 1 mg (1 mg Intravenous Given 12/27/21 1123)     IMPRESSION / MDM / ASSESSMENT AND PLAN / ED COURSE  I reviewed the triage vital signs and the nursing notes.                              61 y.o. female with past medical history of hypertension, diabetes, atrial fibrillation on Eliquis, CHF, COPD, rheumatoid arthritis, bipolar disorder, and chronic pain syndrome who presents to the ED with increasing altered mental status over the past 2 days after she was recently started on Reglan for vomiting.  Patient's presentation is most consistent with acute presentation with potential threat to life or bodily function.  Differential diagnosis includes, but is not limited to, intracranial process, stroke, electrolyte abnormality, UTI, medication effect, psychosis.  Patient nontoxic-appearing and in no acute distress, but is slightly agitated with difficulty answering questions.  Vital signs are unremarkable, no findings to suggest sepsis.  She has a nonfocal neurologic exam.  Symptoms could be due to medication effect from Reglan, no obvious dystonia but we will trial dose of IV Benadryl to see if this helps.  Also plan to check CT head and screening labs.  She does have some tenderness in her abdomen diffusely, unclear how much of a work-up she has had for nausea and vomiting earlier in the week and we will also check CT of her abdomen/pelvis.  Patient without significant improvement in her mental status despite IV Benadryl, we will try dose of IV Ativan for her ongoing agitation.  CT's of her head as well as her abdomen/pelvis are unremarkable, labs are also reassuring with no significant anemia, leukocytosis, electrolyte abnormality, or AKI.  LFTs and lipase are unremarkable, 2 sets of troponin are negative.  Chest x-ray also shows no infiltrate, question mild edema but no respiratory symptoms at this time.  Suspect symptoms likely due to significant Reglan use, although patient would  benefit from medical admission and psychiatry consultation.  Case discussed with hospitalist for admission.      FINAL CLINICAL IMPRESSION(S) / ED DIAGNOSES   Final diagnoses:  Altered mental status, unspecified altered mental status type     Rx / DC Orders   ED Discharge Orders     None        Note:  This document was prepared using Dragon voice recognition software and may include unintentional dictation errors.   Chesley Noon, MD 12/27/21 1214

## 2021-12-27 NOTE — Assessment & Plan Note (Addendum)
Likely due to UTI, UA grossly positive for infection. CT head negative for acute intracranial abnormalities.  Low suspicions for stroke.  Patient recently started Reglan, which may have contributed.  Other differential diagnosis is psychiatry issues.  9/11-12: mental status at baseline.  -Started on Cipro for UTI & AMS resolved -No changes on frequent neurochecks -Consulted psych - Seroquel dose adjusted, but will resume her usual dose at d/c given UTI was cause of AMS -Okay to resume Reglan at d/c

## 2021-12-27 NOTE — H&P (Addendum)
History and Physical    [name] [name] [id number] DOB: 10-31-1960 DOA: 12/27/2021  Referring MD/NP/PA:   PCP: Verita Lamb, NP   Patient coming from:  The patient is coming from home.  At baseline, pt is independent for most of ADL.        Chief Complaint: AMS, agitation  HPI: [name] [name] is a 61 y.o. female with medical history significant of depression, anxiety, bipolar, panic attack, hypertension, hyperlipidemia, diabetes mellitus, COPD on 2 L oxygen, GERD, hypothyroidism, rheumatoid arthritis, OSA on CPAP, morbid obesity, sCHF, IBS, C. difficile, neurogenic bladder (s/p of bilateral pacemaker), gastroparesis, who presents with agitation and altered mental status.   Per her mother at bedside, pt has been confused in the past 2 days, which has worsened since this morning.  Patient is agitated and very anxious. Per EMS report, pt was making repetitive statements like "I cannot do this" and "please do not do this."  Patient moves all extremities normally.  When I was patient in ED, patient has agitation and anxiety.  Denies suicidal and homicidal ideations.  Patient moves all extremities normally.  No facial droop or slurred speech.  She is confused, but arousable, still orientated x3.  Per her mother, patient has gastroparesis. Recently she was started Reglan in the past 6 days.  She has been taking Reglan 4 times a day.  Patient continues to have nausea, vomiting, epigastric abdominal pain.  No diarrhea.  No fever or chills.   Patient does not have chest pain, cough, shortness breath. Denies symptoms of UTI.  Data reviewed independently and ED Course: pt was found to have WBC 8.2, troponin level 3, 3, alcohol level less than 10, GFR> 60.  Temperature normal, blood pressure 148/84, heart rate 82, oxygen saturation 93% on room air currently, RR 29.  VBG with pH 7.43, CO2 41 and O2 65.4.  Chest x-ray showed mild vascular congestion.  CT head negative.  CT of abdomen/pelvis negative for  acute intra-abdominal issues.  Patient is placed on telemetry bed for obs. Psychiatry NP, Gust Rung is consulted.   EKG: I have personally reviewed.  Sinus rhythm, QTc 486, RAD, nonspecific T wave change   Review of Systems: Could not reviewed accurately due to altered mental status and agitation.   Allergy:  Allergies  Allergen Reactions   Cephalexin Hives   Codeine Palpitations, Nausea Only, Nausea And Vomiting, Rash and Shortness Of Breath    "makes heart fly, she gets flushed and passes out"   Doxycycline Rash   Propoxyphene Rash and Shortness Of Breath    Increase heart rate   Sulfa Antibiotics Palpitations, Nausea Only, Shortness Of Breath and Hives    "makes heart fly, she gets flushed and passes out"   Clindamycin Rash    Tongue swelling, oral sores, and Mouth rash   Lovenox [Enoxaparin Sodium] Hives   Hydrocodone Nausea And Vomiting    Hear racing & breaks out into a cold sweat.   Meropenem Rash    Erythematous, hot, pruritic rash over arms, chest, back, abdomen, and face occurred at the end of meropenem infusion on 02/22/18    Past Medical History:  Diagnosis Date   Abdominal wall hernia 01/29/2013   Anxiety    Arthritis    Rheumatoid   C. difficile colitis    Chronic diastolic heart failure ([place])    COVID-19 03/23/2019   Diagnosed at [facility] (send-out) on 03/23/2019   Depression    Diabetes mellitus    states no meds  or diet restrictions  at present   Diastolic CHF (HCC)    Esophagitis    Fluid retention    GERD (gastroesophageal reflux disease)    Hiatal hernia    Hypertension    Hypokalemia due to loss of potassium 10/21/2015   Overview:  Associated with 3 weeks of diarrhea  And QT prolongation.   Hypothyroidism    IBS (irritable bowel syndrome)    Moderate episode of recurrent major depressive disorder (HCC) 06/03/2004   Morbid obesity (HCC)    MRSA (methicillin resistant Staphylococcus aureus) infection 11/2017   left inner thigh abcess   Neurogenic  bladder    has pacemaker   Neuropathy    Obesity    Panic attacks    Pneumonia due to COVID-19 virus    Rheumatoid arthritis (HCC)    Sleep apnea    STATES SEVERE, CANT TOLERATE MASK- LAST STUDY YEARS AGO    Past Surgical History:  Procedure Laterality Date   ABDOMINAL HYSTERECTOMY     CHOLECYSTECTOMY     DG GREAT TOE RIGHT FOOT  02/23/2018   EYE SURGERY     bilateral cataract extraction with IOL   FOOT SURGERY Left 05/13/2021   [facility]   HERNIA REPAIR     ventral hernia with strangulation   IRRIGATION AND DEBRIDEMENT FOOT Left 01/11/2020   Procedure: IRRIGATION AND DEBRIDEMENT FOOT;  Surgeon: Gwyneth Revels, DPM;  Location: [facility] ORS;  Service: Podiatry;  Laterality: Left;   LAPAROSCOPIC GASTRIC BANDING  03/20/2007   TEE WITHOUT CARDIOVERSION N/A 07/16/2019   Procedure: TRANSESOPHAGEAL ECHOCARDIOGRAM (TEE);  Surgeon: Debbe Odea, MD;  Location: [facility] ORS;  Service: Cardiovascular;  Laterality: N/A;   TONSILLECTOMY     TUBAL LIGATION      Social History:  reports that she quit smoking about 22 years ago. Her smoking use included cigarettes. She has a 54.00 pack-year smoking history. She has never used smokeless tobacco. She reports that she does not drink alcohol and does not use drugs.  Family History:  Family History  Problem Relation Age of Onset   Heart failure Father    Bipolar disorder Father    Alcohol abuse Father    Anxiety disorder Father    Depression Father    Heart disease Brother    Heart attack Brother 26       MI s/p stents placed   Anxiety disorder Sister    Depression Sister    Anxiety disorder Sister    Depression Sister    Bipolar disorder Sister    Alcohol abuse Sister    Drug abuse Sister    Heart attack Brother      Prior to Admission medications   Medication Sig Start Date End Date Taking? Authorizing Provider  albuterol (PROVENTIL) (2.5 MG/3ML) 0.083% nebulizer solution Take 3 mLs (2.5 mg total) by nebulization every 6 (six) hours as  needed for wheezing or shortness of breath. 01/19/21  Yes Salena Saner, MD  amLODipine (NORVASC) 10 MG tablet Take 10 mg by mouth daily. 12/10/21  Yes [provider]  Ernestina Patches 62.5-25 MCG/ACT AEPB 1 puff daily. 12/10/21  Yes [provider]  ARIPiprazole (ABILIFY) 5 MG tablet Take 1 tablet (5 mg total) by mouth daily. 12/15/21  Yes Clapacs, Jackquline Denmark, MD  ascorbic acid (VITAMIN C) 500 MG tablet Take 1 tablet (500 mg total) by mouth daily. 05/05/19  Yes Rolly Salter, MD  atorvastatin (LIPITOR) 80 MG tablet SMARTSIG:1 Tablet(s) By Mouth Every Evening 12/10/21  Yes  [provider]  Budeson-Glycopyrrol-Formoterol (BREZTRI AEROSPHERE) 160-9-4.8 MCG/ACT AERO Inhale 2 puffs into the lungs in the morning and at bedtime. 10/01/21  Yes Salena Saner, MD  busPIRone (BUSPAR) 10 MG tablet Take 1 tablet (10 mg total) by mouth 2 (two) times daily. 12/15/21  Yes Clapacs, Jackquline Denmark, MD  calcium citrate-vitamin D 500-500 MG-UNIT chewable tablet Chew 0.5 tablets by mouth daily. 01/16/20  Yes Sheikh, Omair Latif, DO  Calcium Citrate-Vitamin D3 (GNP CALCIUM CITRATE+D MAXIMUM) 315-6.25 MG-MCG TABS Take 1 tablet by mouth daily. 12/10/21  Yes [provider]  Cholecalciferol (VITAMIN D) 125 MCG (5000 UT) CAPS Take 1 capsule by mouth daily. 11/29/19  Yes [provider]  ELIQUIS 5 MG TABS tablet TAKE 1 TABLET BY MOUTH TWICE DAILY 06/26/21  Yes Salena Saner, MD  famotidine (PEPCID) 20 MG tablet Take 1 tablet (20 mg total) by mouth daily. 05/05/19  Yes Rolly Salter, MD  FLUoxetine (PROZAC) 40 MG capsule Take 1 capsule (40 mg total) by mouth daily. 12/15/21 12/15/22 Yes Clapacs, Jackquline Denmark, MD  folic acid (FOLVITE) 1 MG tablet Take 1 mg by mouth daily.    Yes [provider]  hydroxychloroquine (PLAQUENIL) 200 MG tablet Take 200 mg by mouth 2 (two) times daily. 12/10/21  Yes [provider]  levothyroxine (SYNTHROID, LEVOTHROID) 88 MCG tablet Take 88 mcg by mouth  daily before breakfast.   Yes [provider]  metFORMIN (GLUCOPHAGE-XR) 500 MG 24 hr tablet Take 500 mg by mouth daily. 12/31/19  Yes [provider]  metoCLOPramide (REGLAN) 5 MG/5ML solution Take by mouth. 12/20/21  Yes [provider]  metoprolol tartrate (LOPRESSOR) 25 MG tablet Take 25 mg by mouth 2 (two) times daily. 10/10/20  Yes [provider]  oxyCODONE-acetaminophen (PERCOCET/ROXICET) 5-325 MG tablet Take 1 tablet by mouth every 8 (eight) hours as needed for severe pain.   Yes [provider]  polyethylene glycol (MIRALAX / GLYCOLAX) 17 g packet Take 17 g by mouth daily as needed for mild constipation. 05/04/19  Yes Rolly Salter, MD  pramipexole (MIRAPEX) 0.125 MG tablet Take 0.25 mg by mouth at bedtime.    Yes [provider]  pregabalin (LYRICA) 100 MG capsule Take 1 capsule (100 mg total) by mouth 3 (three) times daily. 09/24/21  Yes Edward Jolly, MD  QUEtiapine (SEROQUEL) 300 MG tablet TAKE ONE TABLET BY MOUTH AT BEDTIME. 12/15/21  Yes Clapacs, Jackquline Denmark, MD  TRADJENTA 5 MG TABS tablet Take 5 mg by mouth daily. 12/31/19  Yes [provider]  zinc sulfate 220 (50 Zn) MG capsule Take 220 mg by mouth at bedtime.  12/31/19  Yes [provider]  zolpidem (AMBIEN) 5 MG tablet Take 1 tablet (5 mg total) by mouth at bedtime. 01/20/21  Yes Clapacs, Jackquline Denmark, MD  acetaminophen (TYLENOL) 325 MG tablet Take 2 tablets (650 mg total) by mouth every 6 (six) hours as needed for mild pain or moderate pain (or Fever >/= 101). 01/15/20   Sheikh, Kateri Mc Latif, DO  buprenorphine (BUTRANS) 10 MCG/HR PTWK Place 1 patch onto the skin once a week for 28 days. Patient not taking: Reported on 12/27/2021 01/14/22 02/11/22  Edward Jolly, MD  buprenorphine (BUTRANS) 7.5 MCG/HR Place 1 patch onto the skin once a week for 28 days. Patient not taking: Reported on 12/27/2021 12/17/21 01/14/22  Edward Jolly, MD  FLUoxetine (PROZAC) 20 MG capsule TAKE (1) CAPSULE  BY MOUTH ONCE DAILY. Patient not taking: Reported on 12/27/2021 01/16/21  Clapacs, Madie Reno, MD  furosemide (LASIX) 20 MG tablet TAKE (1) TABLET BY MOUTH DAILY AS NEEDED. Patient not taking: Reported on 12/27/2021 09/05/19   Parrett, Fonnie Mu, NP  Insulin Degludec 100 UNIT/ML SOLN Inject 10 Units into the skin at bedtime. Patient not taking: Reported on 12/27/2021    [provider]  methotrexate 2.5 MG tablet Take 4 tablets by mouth once a week. Patient not taking: Reported on 12/27/2021 10/10/20   [provider]  mupirocin ointment (BACTROBAN) 2 % Apply 1 application topically 2 (two) times daily. Intranasal - to affected area. Patient not taking: Reported on 12/27/2021 11/28/19   Coral Spikes, DO  QUEtiapine (SEROQUEL) 300 MG tablet Take 1 tablet (300 mg total) by mouth at bedtime. Patient not taking: Reported on 12/27/2021 01/20/21   Clapacs, Madie Reno, MD  solifenacin (VESICARE) 5 MG tablet Take 5 mg by mouth daily. Patient not taking: Reported on 12/27/2021 12/31/19   [provider]  zolpidem (AMBIEN) 10 MG tablet Take 1 tablet (10 mg total) by mouth at bedtime. Patient not taking: Reported on 12/27/2021 12/15/21   Gonzella Lex, MD    Physical Exam: Vitals:   12/27/21 1200 12/27/21 1300 12/27/21 1523 12/27/21 1528  BP: (!) 149/87  123/82   Pulse: 76 78 80   Resp: (!) 22 16 17    Temp:   98.3 F (36.8 C)   TempSrc:   Oral   SpO2: 93% 96% 93%   Weight:    (!) 147.2 kg  Height:    5\' 6"  (1.676 m)   General: Not in acute distress HEENT:       Eyes: PERRL, EOMI, no scleral icterus.       ENT: No discharge from the ears and nose,       Neck: No JVD, no bruit, no mass felt. Heme: No neck lymph node enlargement. Cardiac: S1/S2, RRR, No murmurs, No gallops or rubs. Respiratory: No rales, wheezing, rhonchi or rubs. GI: Soft, nondistended, nontender, no organomegaly, BS present. GU: No hematuria Ext: No pitting leg edema bilaterally. 1+DP/PT pulse  bilaterally. Musculoskeletal: No joint deformities, No joint redness or warmth, no limitation of ROM in spin.  Missing great toes bilaterally Skin: No rashes.  Neuro: Patient has confusion, agitation and anxiety, partially following command, oriented X3, cranial nerves II-XII grossly intact, moves all extremities normally.  bilaterally. Knee reflex 1+ bilaterally. Negative Babinski's sign. Normal finger to nose test. Psych:  no suicidal or hemocidal ideation.  Labs on Admission: I have personally reviewed following labs and imaging studies  CBC: Recent Labs  Lab 12/27/21 0941  WBC 8.2  NEUTROABS 6.8  HGB 10.2*  HCT 34.3*  MCV 70.6*  PLT 0000000   Basic Metabolic Panel: Recent Labs  Lab 12/27/21 0941  NA 139  K 4.2  CL 105  CO2 24  GLUCOSE 185*  BUN 12  CREATININE 0.84  CALCIUM 9.2   GFR: Estimated Creatinine Clearance: 106.3 mL/min (by C-G formula based on SCr of 0.84 mg/dL). Liver Function Tests: Recent Labs  Lab 12/27/21 0941  AST 19  ALT 12  ALKPHOS 88  BILITOT 0.6  PROT 8.1  ALBUMIN 3.6   Recent Labs  Lab 12/27/21 0941  LIPASE 33   No results for input(s): "AMMONIA" in the last 168 hours. Coagulation Profile: No results for input(s): "INR", "PROTIME" in the last 168 hours. Cardiac Enzymes: No results for input(s): "CKTOTAL", "CKMB", "CKMBINDEX", "TROPONINI" in the last 168 hours. BNP (last 3 results)  No results for input(s): "PROBNP" in the last 8760 hours. HbA1C: No results for input(s): "HGBA1C" in the last 72 hours. CBG: Recent Labs  Lab 12/27/21 1519  GLUCAP 119*   Lipid Profile: No results for input(s): "CHOL", "HDL", "LDLCALC", "TRIG", "CHOLHDL", "LDLDIRECT" in the last 72 hours. Thyroid Function Tests: No results for input(s): "TSH", "T4TOTAL", "FREET4", "T3FREE", "THYROIDAB" in the last 72 hours. Anemia Panel: No results for input(s): "VITAMINB12", "FOLATE", "FERRITIN", "TIBC", "IRON", "RETICCTPCT" in the last 72 hours. Urine analysis:     Component Value Date/Time   COLORURINE YELLOW (A) 12/27/2021 0941   APPEARANCEUR HAZY (A) 12/27/2021 0941   APPEARANCEUR Clear 11/30/2013 1010   LABSPEC 1.043 (H) 12/27/2021 0941   LABSPEC 1.015 11/30/2013 1010   PHURINE 5.0 12/27/2021 0941   GLUCOSEU NEGATIVE 12/27/2021 0941   GLUCOSEU >=500 11/30/2013 1010   HGBUR NEGATIVE 12/27/2021 0941   BILIRUBINUR NEGATIVE 12/27/2021 0941   BILIRUBINUR Negative 11/30/2013 1010   KETONESUR NEGATIVE 12/27/2021 0941   PROTEINUR 30 (A) 12/27/2021 0941   UROBILINOGEN 0.2 03/14/2007 1000   NITRITE POSITIVE (A) 12/27/2021 0941   LEUKOCYTESUR LARGE (A) 12/27/2021 0941   LEUKOCYTESUR Negative 11/30/2013 1010   Sepsis Labs: @LABRCNTIP (procalcitonin:4,lacticidven:4) )No results found for this or any previous visit (from the past 240 hour(s)).   Radiological Exams on Admission: CT Head Wo Contrast  Result Date: 12/27/2021 CLINICAL DATA:  Mental status change. EXAM: CT HEAD WITHOUT CONTRAST TECHNIQUE: Contiguous axial images were obtained from the base of the skull through the vertex without intravenous contrast. RADIATION DOSE REDUCTION: This exam was performed according to the departmental dose-optimization program which includes automated exposure control, adjustment of the mA and/or kV according to patient size and/or use of iterative reconstruction technique. COMPARISON:  07/14/2019 FINDINGS: Brain: No evidence of acute infarction, hemorrhage, hydrocephalus, extra-axial collection or mass lesion/mass effect. There is mild diffuse low-attenuation within the subcortical and periventricular white matter compatible with chronic microvascular disease. Prominence of the sulci and ventricles compatible with brain atrophy. Vascular: No hyperdense vessel or unexpected calcification. Skull: Normal. Negative for fracture or focal lesion. Sinuses/Orbits: No acute finding. Other: None IMPRESSION: 1. No acute intracranial abnormalities. 2. Chronic small vessel ischemic  disease and brain atrophy. Electronically Signed   By: Kerby Moors M.D.   On: 12/27/2021 11:07   CT Abdomen Pelvis W Contrast  Result Date: 12/27/2021 CLINICAL DATA:  Abdominal pain.  Agitation. EXAM: CT ABDOMEN AND PELVIS WITH CONTRAST TECHNIQUE: Multidetector CT imaging of the abdomen and pelvis was performed using the standard protocol following bolus administration of intravenous contrast. RADIATION DOSE REDUCTION: This exam was performed according to the departmental dose-optimization program which includes automated exposure control, adjustment of the mA and/or kV according to patient size and/or use of iterative reconstruction technique. CONTRAST:  12mL OMNIPAQUE IOHEXOL 300 MG/ML  SOLN COMPARISON:  CT abdomen dated 07/14/2019. FINDINGS: Lower chest: Mild bibasilar scarring/atelectasis. Hepatobiliary: No focal liver abnormality is seen. Status post cholecystectomy. No bile duct dilatation. Pancreas: Unremarkable. No pancreatic ductal dilatation or surrounding inflammatory changes. Spleen: Normal in size without focal abnormality. Adrenals/Urinary Tract: Adrenal glands appear normal. Kidneys are unremarkable without suspicious mass, stone or hydronephrosis. No ureteral or bladder calculi are identified. Bladder appears normal. Stomach/Bowel: No dilated large or small bowel loops. No evidence of bowel wall inflammation. Appendix is normal. Stomach is unremarkable, partially decompressed. Vascular/Lymphatic: No abdominal aortic aneurysm. No acute-appearing vascular abnormality. No enlarged lymph nodes are seen within the abdomen or pelvis. Reproductive: Presumed hysterectomy.  No adnexal mass or  free fluid. Other: There is no free fluid within the abdomen or pelvis on today's exam. No free intraperitoneal air. Musculoskeletal: No acute-appearing osseous abnormality. Mild degenerative spondylosis of the thoracolumbar spine. Stable chronic small anterior abdominal wall hernia, to the RIGHT of midline,  again containing a small amount of fluid. IMPRESSION: 1. No acute findings within the abdomen or pelvis. No bowel obstruction or evidence of bowel wall inflammation. No evidence of acute solid organ abnormality. No renal or ureteral calculi. Appendix is normal. 2. Chronic/incidental findings detailed above. Electronically Signed   By: Franki Cabot M.D.   On: 12/27/2021 11:03   DG Chest Portable 1 View  Result Date: 12/27/2021 CLINICAL DATA:  Altered mental status.  Agitation. EXAM: PORTABLE CHEST 1 VIEW COMPARISON:  05/21/2020 FINDINGS: Lungs are hypoinflated with subtle hazy prominence of the perihilar vessels likely mild degree of vascular congestion. No effusion. Stable borderline cardiomegaly. Remainder of the exam is unchanged. IMPRESSION: Suggestion of minimal vascular congestion. Electronically Signed   By: Marin Olp M.D.   On: 12/27/2021 09:52      Assessment/Plan Principal Problem:   Acute metabolic encephalopathy Active Problems:   Depression with anxiety   Bipolar disorder, unspecified (HCC)   COPD (chronic obstructive pulmonary disease) (HCC)   Abdominal pain   Chronic diastolic CHF (congestive heart failure) (HCC)   Essential (primary) hypertension   HLD (hyperlipidemia)   Hypothyroidism   PAF (paroxysmal atrial fibrillation) (HCC)   UTI (urinary tract infection)   Rheumatoid arthritis (HCC)   Morbid obesity (HCC)   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[employer])   Assessment and Plan: * Acute metabolic encephalopathy Etiology is not clear.  No focal neurodeficit on physical examination.  CT head negative for acute intracranial abnormalities.  Low suspicions for stroke.  Patient recently started Reglan, which may have contributed to list of heart failure.  Other differential diagnosis is psychiatry issues.  -Placed on telemetry bed for patient -Frequent neurochecks -Consulted psych NP, Gust Rung -As needed Ativan for agitation -hold reglan - f/u  UA  Addendum: UA showed positive urinalysis with hazy appearance, large amount of leukocyte, positive nitrite, rare bacteria, WBC 11-20 -will start cipro -f/u Urine culture.       Depression with anxiety History of depression with anxiety and bipolar disorder: -Follow-up with psychiatrist recommendation -Hold Abilify due to history of QTc prolongation and QTc 486 today, as per pharmacist recommendation -Continue BuSpar, Prozac, pramipexole, Seroquel -As needed Ativan  Bipolar disorder, unspecified ([place]) - See above  COPD (chronic obstructive pulmonary disease) ([place]) Stable -Bronchodilators  Abdominal pain Likely due to gastroparesis.  CT abdomen/pelvis negative for acute intra-abdominal issues. -Hold Reglan -As needed Zofran and Benadryl -Continue home pain medication: As needed Percocet  Chronic diastolic CHF (congestive heart failure) ([place]) 2D echo on 11/03/2021 showed EF of 55-60% with grade 1 diastolic dysfunction.  Patient does not have leg edema.  Chest x-ray showed mild vascular congestion, does not seem to have CHF exacerbation. -Watch volume status closely  Essential (primary) hypertension - IV hydralazine as needed -Amlodipine, metoprolol,  HLD (hyperlipidemia) - Lipitor  Hypothyroidism - Synthroid  PAF (paroxysmal atrial fibrillation) (HCC) Heart rate 82 -Continue Eliquis -Metoprolol  UTI (urinary tract infection) -started cipro -f/u Ux  Rheumatoid arthritis ([place]) - Hold Plaquenil due to QTc prolongation  Morbid obesity (HCC) Body weight 147.4 kg, BMI 52.5. -Diet and exercise.   -Encourage to lose weight.            DVT ppx: on Eliquis  Code  Status: Full code  Family Communication:   Yes, patient's mother   at bed side.    Disposition Plan:  Anticipate discharge back to previous environment  Consults called:  Psychiatry NP, Gust Rung is consulted.  Admission status and Level of care: Telemetry Medical:    Med-surg bed for obs  as inpt    progressive unit for obs   as inpt      SDU/inpation         Dispo: The patient is from: Home              Anticipated d/c is to: Home              Anticipated d/c date is: 1 day              Patient currently is not medically stable to d/c.    Severity of Illness:  The appropriate patient status for this patient is OBSERVATION. Observation status is judged to be reasonable and necessary in order to provide the required intensity of service to ensure the patient's safety. The patient's presenting symptoms, physical exam findings, and initial radiographic and laboratory data in the context of their medical condition is felt to place them at decreased risk for further clinical deterioration. Furthermore, it is anticipated that the patient will be medically stable for discharge from the hospital within 2 midnights of admission.        Date of Service 12/27/2021    Ivor Costa [facility]   If 7PM-7AM, please contact night-coverage [email or web address] 12/27/2021, 4:28 PM

## 2021-12-27 NOTE — Assessment & Plan Note (Signed)
Stable, well compensated. Echo on 11/03/2021 showed EF of 55-60% with grade 1 diastolic dysfunction.   --Monitor volume status

## 2021-12-27 NOTE — Assessment & Plan Note (Signed)
Likely due to gastroparesis.  CT abdomen/pelvis negative for acute intra-abdominal issues. -Reglan was held due to ?onset of AMS timing after Reglan initiated, but it was more likely from her UTI -Okay to resume Reglan at d/c, but if mental status changes recur, would discontinue it. -Treated with PRN Zofran and Benadryl -Continue home as needed Percocet

## 2021-12-27 NOTE — Assessment & Plan Note (Signed)
-   Hold Plaquenil due to QTc prolongation

## 2021-12-27 NOTE — ED Notes (Signed)
Pt denies any SI or HI. Pt denies any hallucinations or hearing voices. Pt redirected and informed that everything is going to be ok and that we are going to take care of her. Pt states ok.

## 2021-12-27 NOTE — Assessment & Plan Note (Addendum)
History of depression with anxiety and bipolar disorder: -Seen by psychiatry -Hold Abilify due to history of QTc prolongation and QTc 486 today, as per pharmacist recommendation -Continue BuSpar, Prozac, pramipexole, Seroquel

## 2021-12-27 NOTE — ED Notes (Signed)
Report given to Reina RN 

## 2021-12-27 NOTE — Assessment & Plan Note (Signed)
Stable -Bronchodilators 

## 2021-12-27 NOTE — Assessment & Plan Note (Signed)
Synthroid 

## 2021-12-27 NOTE — ED Triage Notes (Signed)
Pt comes via EMs from home with c/o agitation. Pt keeps saying "I can't do this". Family reports to EMS that pt began at 230am banding on door and seemed upset. Pt recently started on REglan and giv en extra dose at some point. Pt is Alert and oriented.   CBG-214 HR-79 O2-92 % RA T-97.9

## 2021-12-27 NOTE — Assessment & Plan Note (Signed)
Heart rate controlled. -Continue Eliquis -Metoprolol

## 2021-12-27 NOTE — ED Notes (Signed)
Pastor of pt at bedside comforting pt. Pt crying and anxious.

## 2021-12-27 NOTE — ED Notes (Signed)
Pt still fidgeting and groaning in bed but is slightly less anxious than was.

## 2021-12-27 NOTE — Assessment & Plan Note (Addendum)
Continue cipro 3 more days at d/c. Urine culture grew Klebsiella pneumoniae sensitive to Cipro.  Pt clinically improved, mental status changes resolved.

## 2021-12-27 NOTE — Assessment & Plan Note (Signed)
Body weight 147.4 kg, BMI 52.5. -Diet and exercise.   -Encourage to lose weight.

## 2021-12-27 NOTE — Assessment & Plan Note (Signed)
-   IV hydralazine as needed -Amlodipine, metoprolol,

## 2021-12-27 NOTE — Consult Note (Signed)
Pharmacy Antibiotic Note  [name] [name] is a 61 y.o. female admitted on 12/27/2021 with UTI.  Pharmacy has been consulted for ciprofloxacin dosing.  Plan: Give ciprofloxacin 500mg  PO every twice daily Continue to monitor and dose adjust antibiotics according to renal function and indication  Height: 5\' 6"  (167.6 cm) Weight: (!) 147.2 kg (324 lb 8.3 oz) IBW/kg (Calculated) : 59.3  Temp (24hrs), Avg:98.3 F (36.8 C), Min:98.2 F (36.8 C), Max:98.3 F (36.8 C)  Recent Labs  Lab 12/27/21 0941  WBC 8.2  CREATININE 0.84    Estimated Creatinine Clearance: 106.3 mL/min (by C-G formula based on SCr of 0.84 mg/dL).    Allergies  Allergen Reactions   Cephalexin Hives   Codeine Palpitations, Nausea Only, Nausea And Vomiting, Rash and Shortness Of Breath    "makes heart fly, she gets flushed and passes out"   Doxycycline Rash   Propoxyphene Rash and Shortness Of Breath    Increase heart rate   Sulfa Antibiotics Palpitations, Nausea Only, Shortness Of Breath and Hives    "makes heart fly, she gets flushed and passes out"   Clindamycin Rash    Tongue swelling, oral sores, and Mouth rash   Lovenox [Enoxaparin Sodium] Hives   Hydrocodone Nausea And Vomiting    Hear racing & breaks out into a cold sweat.   Meropenem Rash    Erythematous, hot, pruritic rash over arms, chest, back, abdomen, and face occurred at the end of meropenem infusion on 02/22/18    Antimicrobials this admission: 9/10 Ciprofloxacin 500mg  QBID    Microbiology results: 9/10 UCx: sent   Thank you for allowing pharmacy to be a part of this patient's care.  11/10 12/27/2021 4:42 PM

## 2021-12-28 ENCOUNTER — Telehealth: Payer: Self-pay | Admitting: Student in an Organized Health Care Education/Training Program

## 2021-12-28 DIAGNOSIS — E785 Hyperlipidemia, unspecified: Secondary | ICD-10-CM | POA: Diagnosis present

## 2021-12-28 DIAGNOSIS — I480 Paroxysmal atrial fibrillation: Secondary | ICD-10-CM | POA: Diagnosis present

## 2021-12-28 DIAGNOSIS — M069 Rheumatoid arthritis, unspecified: Secondary | ICD-10-CM | POA: Diagnosis present

## 2021-12-28 DIAGNOSIS — E1143 Type 2 diabetes mellitus with diabetic autonomic (poly)neuropathy: Secondary | ICD-10-CM | POA: Diagnosis present

## 2021-12-28 DIAGNOSIS — Z9981 Dependence on supplemental oxygen: Secondary | ICD-10-CM | POA: Diagnosis not present

## 2021-12-28 DIAGNOSIS — Z9689 Presence of other specified functional implants: Secondary | ICD-10-CM | POA: Diagnosis present

## 2021-12-28 DIAGNOSIS — K3184 Gastroparesis: Secondary | ICD-10-CM | POA: Diagnosis present

## 2021-12-28 DIAGNOSIS — R4182 Altered mental status, unspecified: Secondary | ICD-10-CM

## 2021-12-28 DIAGNOSIS — G9341 Metabolic encephalopathy: Secondary | ICD-10-CM | POA: Diagnosis not present

## 2021-12-28 DIAGNOSIS — N390 Urinary tract infection, site not specified: Secondary | ICD-10-CM | POA: Diagnosis present

## 2021-12-28 DIAGNOSIS — Z6841 Body Mass Index (BMI) 40.0 and over, adult: Secondary | ICD-10-CM | POA: Diagnosis not present

## 2021-12-28 DIAGNOSIS — Z79899 Other long term (current) drug therapy: Secondary | ICD-10-CM | POA: Diagnosis not present

## 2021-12-28 DIAGNOSIS — I110 Hypertensive heart disease with heart failure: Secondary | ICD-10-CM | POA: Diagnosis present

## 2021-12-28 DIAGNOSIS — I5032 Chronic diastolic (congestive) heart failure: Secondary | ICD-10-CM | POA: Diagnosis present

## 2021-12-28 DIAGNOSIS — G894 Chronic pain syndrome: Secondary | ICD-10-CM | POA: Diagnosis present

## 2021-12-28 DIAGNOSIS — J449 Chronic obstructive pulmonary disease, unspecified: Secondary | ICD-10-CM | POA: Diagnosis present

## 2021-12-28 DIAGNOSIS — B961 Klebsiella pneumoniae [K. pneumoniae] as the cause of diseases classified elsewhere: Secondary | ICD-10-CM | POA: Diagnosis present

## 2021-12-28 DIAGNOSIS — F419 Anxiety disorder, unspecified: Secondary | ICD-10-CM | POA: Diagnosis present

## 2021-12-28 DIAGNOSIS — E039 Hypothyroidism, unspecified: Secondary | ICD-10-CM | POA: Diagnosis present

## 2021-12-28 DIAGNOSIS — K219 Gastro-esophageal reflux disease without esophagitis: Secondary | ICD-10-CM | POA: Diagnosis present

## 2021-12-28 DIAGNOSIS — G2581 Restless legs syndrome: Secondary | ICD-10-CM | POA: Diagnosis present

## 2021-12-28 DIAGNOSIS — F319 Bipolar disorder, unspecified: Secondary | ICD-10-CM | POA: Diagnosis present

## 2021-12-28 DIAGNOSIS — Z8616 Personal history of COVID-19: Secondary | ICD-10-CM | POA: Diagnosis not present

## 2021-12-28 DIAGNOSIS — K589 Irritable bowel syndrome without diarrhea: Secondary | ICD-10-CM | POA: Diagnosis present

## 2021-12-28 HISTORY — DX: Altered mental status, unspecified: R41.82

## 2021-12-28 LAB — GLUCOSE, CAPILLARY
Glucose-Capillary: 126 mg/dL — ABNORMAL HIGH (ref 70–99)
Glucose-Capillary: 127 mg/dL — ABNORMAL HIGH (ref 70–99)
Glucose-Capillary: 142 mg/dL — ABNORMAL HIGH (ref 70–99)
Glucose-Capillary: 102 mg/dL — ABNORMAL HIGH (ref 70–99)

## 2021-12-28 MED ORDER — BUDESONIDE 0.5 MG/2ML IN SUSP
RESPIRATORY_TRACT | Status: AC
  Filled 2021-12-28: qty 2

## 2021-12-28 MED ORDER — TRADJENTA 5 MG TABLET
ORAL_TABLET | Freq: Every day | ORAL | 1 refills | 90 days | Status: CP
Start: 2021-12-28 — End: 2022-12-28

## 2021-12-28 MED ORDER — APIXABAN 5 MG TABLET
ORAL_TABLET | Freq: Two times a day (BID) | ORAL | 1 refills | 90.00000 days
Start: 2021-12-28 — End: 2022-12-28

## 2021-12-28 NOTE — Progress Notes (Signed)
Progress Note   Patient: [name] [name] [id number] DOB: 1960/06/15 DOA: 12/27/2021     0 DOS: the patient was seen and examined on 12/28/2021   Brief hospital course: [name] [name] is a 61 y.o. female with medical history significant of depression, anxiety, bipolar, panic attack, hypertension, hyperlipidemia, diabetes mellitus, COPD on 2 L oxygen, GERD, hypothyroidism, rheumatoid arthritis, OSA on CPAP, morbid obesity, sCHF, IBS, C. difficile, neurogenic bladder (s/p of bilateral pacemaker), gastroparesis, who presented on 12/27/2021 for evaluation of agitation and altered mental status.   Psychiatry was consulted on admission due to pt's underlying psych co-morbidities.  Seroquel dose was adjusted to help with anxiety.     She was found to have a UTI, started on Cipro and clinically improved.  Awaiting urine cultures.    Assessment and Plan: * Acute metabolic encephalopathy Likely due to UTI, UA grossly positive for infection. CT head negative for acute intracranial abnormalities.  Low suspicions for stroke.  Patient recently started Reglan, which may have contributed.  Other differential diagnosis is psychiatry issues.  -Started on Cipro for UTI -Frequent neurochecks -Consulted psych  -Seroquel dose adjusted -As needed Ativan for agitation -hold reglan        Depression with anxiety History of depression with anxiety and bipolar disorder: -Seen by psychiatry -Hold Abilify due to history of QTc prolongation and QTc 486 today, as per pharmacist recommendation -Continue BuSpar, Prozac, pramipexole, Seroquel (dosing adjusted per psych to help anxiety) -As needed Ativan  Bipolar disorder, unspecified (HCC) Seroquel 25 mg BID Seroquel 300 mg daily at bedtime Prozac 40 mg daily    COPD (chronic obstructive pulmonary disease) (HCC) Stable -Bronchodilators  Abdominal pain Likely due to gastroparesis.  CT abdomen/pelvis negative for acute intra-abdominal issues. -Hold  Reglan (due to ?onset of AMS timing after Reglan initiated) -As needed Zofran and Benadryl -Continue home as needed Percocet  Chronic diastolic CHF (congestive heart failure) (HCC) Stable, well compensated. Echo on 11/03/2021 showed EF of 55-60% with grade 1 diastolic dysfunction.   --Monitor volume status  Essential (primary) hypertension - IV hydralazine as needed -Amlodipine, metoprolol,  HLD (hyperlipidemia) - Lipitor  Hypothyroidism - Synthroid  PAF (paroxysmal atrial fibrillation) (HCC) Heart rate 82 -Continue Eliquis -Metoprolol  UTI (urinary tract infection) -continue cipro -f/u Ux  Rheumatoid arthritis (HCC) - Hold Plaquenil due to QTc prolongation  Morbid obesity (HCC) Body weight 147.4 kg, BMI 52.5. -Diet and exercise.   -Encourage to lose weight.    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[employer]) .  AMS (altered mental status) .        Subjective: Pt awake sitting up in bed. Reports feeling much better.  Mental status has improved and she reports feeling back to normal.  She says she remembers "talking out of my head", and this is what typically happens when she has an infection.  No other acute complaints at this time.  No acute events reported.   Physical Exam: Vitals:   12/28/21 0721 12/28/21 0918 12/28/21 1139 12/28/21 1702  BP:  (!) 102/49 (!) 91/47 (!) 111/49  Pulse:  65 (!) 59 65  Resp:  16 16   Temp:  98 F (36.7 C) 98 F (36.7 C) 98.3 F (36.8 C)  TempSrc:      SpO2: 95% 96% 95% 96%  Weight:      Height:       General exam: awake, alert, no acute distress, obese HEENT: moist mucus membranes, hearing grossly normal  Respiratory system: CTAB, no wheezes, rales  or rhonchi, normal respiratory effort. Cardiovascular system: normal S1/S2, RRR  Gastrointestinal system: soft, NT, ND Central nervous system: A&O x3. no gross focal neurologic deficits, normal speech Extremities: moves all, normal tone, b/l hallux  amputations, trace pedal edema Skin: dry, intact, normal temperature Psychiatry: normal mood, congruent affect, judgement and insight appear normal   Data Reviewed:  Notable labs --- urine culture growing klebsiella pneumonia.  CBG's at goal  Family Communication: none  Disposition: Status is: Inpatient Remains inpatient appropriate because: On IV antibiotics pending culture results   Planned Discharge Destination: Home    Time spent: 40 minutes  Author: Pennie Banter, DO 12/28/2021 6:53 PM  For on call review [email or web address].

## 2021-12-28 NOTE — Telephone Encounter (Signed)
Sent by [facility] today.

## 2021-12-28 NOTE — Telephone Encounter (Signed)
PT stated that the pharmacy need an pre autho for her pain patches. Please give patient a call, when the order has been send in. Thanks

## 2021-12-28 NOTE — Hospital Course (Signed)
[name] [name] is a 61 y.o. female with medical history significant of depression, anxiety, bipolar, panic attack, hypertension, hyperlipidemia, diabetes mellitus, COPD on 2 L oxygen, GERD, hypothyroidism, rheumatoid arthritis, OSA on CPAP, morbid obesity, sCHF, IBS, C. difficile, neurogenic bladder (s/p of bilateral pacemaker), gastroparesis, who presented on 12/27/2021 for evaluation of agitation and altered mental status.   Psychiatry was consulted on admission due to pt's underlying psych co-morbidities.  Seroquel dose was adjusted to help with anxiety.     She was found to have a UTI, started on Cipro and clinically improved.  Urine culture grew Klebsiella pneumoniae sensitive to Cipro.  Discharge with 3 more days Cipro to complete UTI treatment.  Advised patient to closely follow up with PCP.  She mentions she feels she is on too many medications, and wants to de-escalate as much as possible.  Encouraged her to continue discussing with PCP and other outpatient providers.

## 2021-12-29 ENCOUNTER — Encounter: Payer: Self-pay | Admitting: Internal Medicine

## 2021-12-29 DIAGNOSIS — G9341 Metabolic encephalopathy: Secondary | ICD-10-CM | POA: Diagnosis not present

## 2021-12-29 LAB — URINE CULTURE: Culture: >=100000 — AB

## 2021-12-29 LAB — GLUCOSE, CAPILLARY
Glucose-Capillary: 131 mg/dL — ABNORMAL HIGH (ref 70–99)
Glucose-Capillary: 163 mg/dL — ABNORMAL HIGH (ref 70–99)
Glucose-Capillary: 136 mg/dL — ABNORMAL HIGH (ref 70–99)

## 2021-12-29 LAB — HIV ANTIBODY (ROUTINE TESTING W REFLEX): HIV Screen 4th Generation wRfx: NONREACTIVE

## 2021-12-29 MED ORDER — LEVOTHYROXINE SODIUM 88 MCG PO TABS: 88.0000 ug | ORAL_TABLET | Freq: Every day | ORAL | Status: DC

## 2021-12-29 MED ORDER — LINAGLIPTIN 5 MG PO TABS
5.0000 mg | ORAL_TABLET | Freq: Every day | ORAL | Status: DC
  Administered 2021-12-29: 5 mg via ORAL
  Filled 2021-12-29: qty 1

## 2021-12-29 MED ORDER — CIPROFLOXACIN HCL 500 MG PO TABS: 500.0000 mg | ORAL_TABLET | Freq: Two times a day (BID) | ORAL | 0 refills | Status: AC

## 2021-12-29 MED ORDER — APIXABAN 5 MG TABLET
ORAL_TABLET | Freq: Two times a day (BID) | ORAL | 1 refills | 90 days | Status: CP
Start: 2021-12-29 — End: 2022-12-29

## 2021-12-29 NOTE — Discharge Summary (Signed)
Physician Discharge Summary   Patient: [name] [name] MRN: [id number] DOB: May 14, 1960  Admit date:     12/27/2021  Discharge date: 12/29/21  Discharge Physician: Ezekiel Slocumb   PCP: Verita Lamb, NP   Recommendations at discharge:   Follow up with Primary Care in 1-2 weeks Follow up at [facility] with Dr. Holley Raring as scheduled.  Continue current medication regimen. Consider reducing medications to lower risk of adverse effects of polypharmacy.   Repeat BMP, CBC in 1-2 weeks   Discharge Diagnoses: Active Problems:   Depression with anxiety   Bipolar disorder, unspecified (HCC)   COPD (chronic obstructive pulmonary disease) (HCC)   Chronic diastolic CHF (congestive heart failure) (HCC)   Essential (primary) hypertension   HLD (hyperlipidemia)   Hypothyroidism   PAF (paroxysmal atrial fibrillation) (HCC)   UTI (urinary tract infection)   Rheumatoid arthritis (HCC)   Morbid obesity (HCC)   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[employer])  Principal Problem (Resolved):   Acute metabolic encephalopathy Resolved Problems:   Abdominal pain   AMS (altered mental status)  Hospital Course: [name] [name] is a 61 y.o. female with medical history significant of depression, anxiety, bipolar, panic attack, hypertension, hyperlipidemia, diabetes mellitus, COPD on 2 L oxygen, GERD, hypothyroidism, rheumatoid arthritis, OSA on CPAP, morbid obesity, sCHF, IBS, C. difficile, neurogenic bladder (s/p of bilateral pacemaker), gastroparesis, who presented on 12/27/2021 for evaluation of agitation and altered mental status.   Psychiatry was consulted on admission due to pt's underlying psych co-morbidities.  Seroquel dose was adjusted to help with anxiety.     She was found to have a UTI, started on Cipro and clinically improved.  Urine culture grew Klebsiella pneumoniae sensitive to Cipro.  Discharge with 3 more days Cipro to complete UTI treatment.  Advised patient to  closely follow up with PCP.  She mentions she feels she is on too many medications, and wants to de-escalate as much as possible.  Encouraged her to continue discussing with PCP and other outpatient providers.      Assessment and Plan: * Acute metabolic encephalopathy-resolved as of 12/29/2021 Likely due to UTI, UA grossly positive for infection. CT head negative for acute intracranial abnormalities.  Low suspicions for stroke.  Patient recently started Reglan, which may have contributed.  Other differential diagnosis is psychiatry issues.  9/11-12: mental status at baseline.  -Started on Cipro for UTI & AMS resolved -No changes on frequent neurochecks -Consulted psych - Seroquel dose adjusted, but will resume her usual dose at d/c given UTI was cause of AMS -Okay to resume Reglan at d/c        Depression with anxiety History of depression with anxiety and bipolar disorder: -Seen by psychiatry -Hold Abilify due to history of QTc prolongation and QTc 486 today, as per pharmacist recommendation -Continue BuSpar, Prozac, pramipexole, Seroquel  Bipolar disorder, unspecified (HCC) Seroquel 300 mg daily at bedtime Prozac 40 mg daily    COPD (chronic obstructive pulmonary disease) ([place]) Stable -Bronchodilators  Abdominal pain-resolved as of 12/29/2021 Likely due to gastroparesis.  CT abdomen/pelvis negative for acute intra-abdominal issues. -Reglan was held due to ?onset of AMS timing after Reglan initiated, but it was more likely from her UTI -Okay to resume Reglan at d/c, but if mental status changes recur, would discontinue it. -Treated with PRN Zofran and Benadryl -Continue home as needed Percocet  Chronic diastolic CHF (congestive heart failure) (HCC) Stable, well compensated. Echo on 11/03/2021 showed EF of 55-60% with grade 1 diastolic dysfunction.   --  Monitor volume status  Essential (primary) hypertension - IV hydralazine as needed -Amlodipine, metoprolol,  HLD  (hyperlipidemia) - Lipitor  Hypothyroidism - Synthroid  PAF (paroxysmal atrial fibrillation) (HCC) Heart rate controlled. -Continue Eliquis -Metoprolol  UTI (urinary tract infection) Continue cipro 3 more days at d/c. Urine culture grew Klebsiella pneumoniae sensitive to Cipro.  Pt clinically improved, mental status changes resolved.   Rheumatoid arthritis ([place]) - Hold Plaquenil due to QTc prolongation  Morbid obesity (HCC) Body weight 147.4 kg, BMI 52.5. -Diet and exercise.   -Encourage to lose weight.    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[employer]) .  AMS (altered mental status)-resolved as of 12/29/2021 .         Consultants: Psychiatry Procedures performed: None  Disposition: Home Diet recommendation:  Cardiac diet DISCHARGE MEDICATION: Allergies as of 12/29/2021       Reactions   Cephalexin Hives   Codeine Palpitations, Nausea Only, Nausea And Vomiting, Rash, Shortness Of Breath   "makes heart fly, she gets flushed and passes out"   Doxycycline Rash   Propoxyphene Rash, Shortness Of Breath   Increase heart rate   Sulfa Antibiotics Palpitations, Nausea Only, Shortness Of Breath, Hives   "makes heart fly, she gets flushed and passes out"   Clindamycin Rash   Tongue swelling, oral sores, and Mouth rash   Lovenox [enoxaparin Sodium] Hives   Hydrocodone Nausea And Vomiting   Hear racing & breaks out into a cold sweat.   Meropenem Rash   Erythematous, hot, pruritic rash over arms, chest, back, abdomen, and face occurred at the end of meropenem infusion on 02/22/18        Medication List     STOP taking these medications    ARIPiprazole 5 MG tablet Commonly known as: ABILIFY   Insulin Degludec 100 UNIT/ML Soln   methotrexate 2.5 MG tablet   mupirocin ointment 2 % Commonly known as: Bactroban   solifenacin 5 MG tablet Commonly known as: VESICARE       TAKE these medications    acetaminophen 325 MG tablet Commonly  known as: TYLENOL Take 2 tablets (650 mg total) by mouth every 6 (six) hours as needed for mild pain or moderate pain (or Fever >/= 101).   albuterol (2.5 MG/3ML) 0.083% nebulizer solution Commonly known as: PROVENTIL Take 3 mLs (2.5 mg total) by nebulization every 6 (six) hours as needed for wheezing or shortness of breath.   amLODipine 10 MG tablet Commonly known as: NORVASC Take 10 mg by mouth daily.   Anoro Ellipta 62.5-25 MCG/ACT Aepb Generic drug: umeclidinium-vilanterol 1 puff daily.   ascorbic acid 500 MG tablet Commonly known as: VITAMIN C Take 1 tablet (500 mg total) by mouth daily.   atorvastatin 80 MG tablet Commonly known as: LIPITOR SMARTSIG:1 Tablet(s) By Mouth Every Evening   Breztri Aerosphere 160-9-4.8 MCG/ACT Aero Generic drug: Budeson-Glycopyrrol-Formoterol Inhale 2 puffs into the lungs in the morning and at bedtime.   buprenorphine 7.5 MCG/HR Commonly known as: Butrans Place 1 patch onto the skin once a week for 28 days.   buprenorphine 10 MCG/HR Ptwk Commonly known as: Butrans Place 1 patch onto the skin once a week for 28 days. Start taking on: January 14, 2022   busPIRone 10 MG tablet Commonly known as: BUSPAR Take 1 tablet (10 mg total) by mouth 2 (two) times daily.   calcium citrate-vitamin D 500-500 MG-UNIT chewable tablet Chew 0.5 tablets by mouth daily.   GNP Calcium Citrate+D Maximum 315-6.25 MG-MCG Tabs Generic drug: Calcium  Citrate-Vitamin D3 Take 1 tablet by mouth daily.   ciprofloxacin 500 MG tablet Commonly known as: CIPRO Take 1 tablet (500 mg total) by mouth 2 (two) times daily for 3 days.   Eliquis 5 MG Tabs tablet Generic drug: apixaban TAKE 1 TABLET BY MOUTH TWICE DAILY   famotidine 20 MG tablet Commonly known as: PEPCID Take 1 tablet (20 mg total) by mouth daily.   FLUoxetine 40 MG capsule Commonly known as: PROzac Take 1 capsule (40 mg total) by mouth daily. What changed: Another medication with the same name  was removed. Continue taking this medication, and follow the directions you see here.   folic acid 1 MG tablet Commonly known as: FOLVITE Take 1 mg by mouth daily.   furosemide 20 MG tablet Commonly known as: LASIX TAKE (1) TABLET BY MOUTH DAILY AS NEEDED.   hydroxychloroquine 200 MG tablet Commonly known as: PLAQUENIL Take 200 mg by mouth 2 (two) times daily.   levothyroxine 88 MCG tablet Commonly known as: SYNTHROID Take 88 mcg by mouth daily before breakfast.   metFORMIN 500 MG 24 hr tablet Commonly known as: GLUCOPHAGE-XR Take 500 mg by mouth daily.   metoCLOPramide 5 MG/5ML solution Commonly known as: REGLAN Take by mouth.   metoprolol tartrate 25 MG tablet Commonly known as: LOPRESSOR Take 25 mg by mouth 2 (two) times daily.   oxyCODONE-acetaminophen 5-325 MG tablet Commonly known as: PERCOCET/ROXICET Take 1 tablet by mouth every 8 (eight) hours as needed for severe pain.   polyethylene glycol 17 g packet Commonly known as: MIRALAX / GLYCOLAX Take 17 g by mouth daily as needed for mild constipation.   pramipexole 0.125 MG tablet Commonly known as: MIRAPEX Take 0.25 mg by mouth at bedtime.   pregabalin 100 MG capsule Commonly known as: Lyrica Take 1 capsule (100 mg total) by mouth 3 (three) times daily.   QUEtiapine 300 MG tablet Commonly known as: SEROQUEL TAKE ONE TABLET BY MOUTH AT BEDTIME.   Tradjenta 5 MG Tabs tablet Generic drug: linagliptin Take 5 mg by mouth daily.   Vitamin D 125 MCG (5000 UT) Caps Take 1 capsule by mouth daily.   zinc sulfate 220 (50 Zn) MG capsule Take 220 mg by mouth at bedtime.   zolpidem 5 MG tablet Commonly known as: AMBIEN Take 1 tablet (5 mg total) by mouth at bedtime.        Discharge Exam: Filed Weights   12/28/21 0451 12/28/21 0601 12/29/21 0631  Weight: (!) 144.3 kg (!) 144.6 kg (!) 145.5 kg   General exam: awake, alert, no acute distress, obese HEENT: moist mucus membranes, hearing grossly normal   Respiratory system: CTAB, no wheezes, rales or rhonchi, normal respiratory effort. Cardiovascular system: normal S1/S2, RRR.   Gastrointestinal system: soft, NT, ND Central nervous system: A&O x4. no gross focal neurologic deficits, normal speech Extremities: moves all, normal tone, b/l hallux amputations, mild pedal edema Skin: dry, intact, normal temperature Psychiatry: normal mood, congruent affect, judgement and insight appear normal   Condition at discharge: stable  The results of significant diagnostics from this hospitalization (including imaging, microbiology, ancillary and laboratory) are listed below for reference.   Imaging Studies: CT Head Wo Contrast  Result Date: 12/27/2021 CLINICAL DATA:  Mental status change. EXAM: CT HEAD WITHOUT CONTRAST TECHNIQUE: Contiguous axial images were obtained from the base of the skull through the vertex without intravenous contrast. RADIATION DOSE REDUCTION: This exam was performed according to the departmental dose-optimization program which includes automated exposure control, adjustment  of the mA and/or kV according to patient size and/or use of iterative reconstruction technique. COMPARISON:  07/14/2019 FINDINGS: Brain: No evidence of acute infarction, hemorrhage, hydrocephalus, extra-axial collection or mass lesion/mass effect. There is mild diffuse low-attenuation within the subcortical and periventricular white matter compatible with chronic microvascular disease. Prominence of the sulci and ventricles compatible with brain atrophy. Vascular: No hyperdense vessel or unexpected calcification. Skull: Normal. Negative for fracture or focal lesion. Sinuses/Orbits: No acute finding. Other: None IMPRESSION: 1. No acute intracranial abnormalities. 2. Chronic small vessel ischemic disease and brain atrophy. Electronically Signed   By: Signa Kell M.D.   On: 12/27/2021 11:07   CT Abdomen Pelvis W Contrast  Result Date: 12/27/2021 CLINICAL DATA:   Abdominal pain.  Agitation. EXAM: CT ABDOMEN AND PELVIS WITH CONTRAST TECHNIQUE: Multidetector CT imaging of the abdomen and pelvis was performed using the standard protocol following bolus administration of intravenous contrast. RADIATION DOSE REDUCTION: This exam was performed according to the departmental dose-optimization program which includes automated exposure control, adjustment of the mA and/or kV according to patient size and/or use of iterative reconstruction technique. CONTRAST:  OMNIPAQUE IOHEXOL 300 MG/ML  SOLN COMPARISON:  CT abdomen dated 07/14/2019. FINDINGS: Lower chest: Mild bibasilar scarring/atelectasis. Hepatobiliary: No focal liver abnormality is seen. Status post cholecystectomy. No bile duct dilatation. Pancreas: Unremarkable. No pancreatic ductal dilatation or surrounding inflammatory changes. Spleen: Normal in size without focal abnormality. Adrenals/Urinary Tract: Adrenal glands appear normal. Kidneys are unremarkable without suspicious mass, stone or hydronephrosis. No ureteral or bladder calculi are identified. Bladder appears normal. Stomach/Bowel: No dilated large or small bowel loops. No evidence of bowel wall inflammation. Appendix is normal. Stomach is unremarkable, partially decompressed. Vascular/Lymphatic: No abdominal aortic aneurysm. No acute-appearing vascular abnormality. No enlarged lymph nodes are seen within the abdomen or pelvis. Reproductive: Presumed hysterectomy.  No adnexal mass or free fluid. Other: There is no free fluid within the abdomen or pelvis on today's exam. No free intraperitoneal air. Musculoskeletal: No acute-appearing osseous abnormality. Mild degenerative spondylosis of the thoracolumbar spine. Stable chronic small anterior abdominal wall hernia, to the RIGHT of midline, again containing a small amount of fluid. IMPRESSION: 1. No acute findings within the abdomen or pelvis. No bowel obstruction or evidence of bowel wall inflammation. No evidence  of acute solid organ abnormality. No renal or ureteral calculi. Appendix is normal. 2. Chronic/incidental findings detailed above. Electronically Signed   By: Bary Richard M.D.   On: 12/27/2021 11:03   DG Chest Portable 1 View  Result Date: 12/27/2021 CLINICAL DATA:  Altered mental status.  Agitation. EXAM: PORTABLE CHEST 1 VIEW COMPARISON:  05/21/2020 FINDINGS: Lungs are hypoinflated with subtle hazy prominence of the perihilar vessels likely mild degree of vascular congestion. No effusion. Stable borderline cardiomegaly. Remainder of the exam is unchanged. IMPRESSION: Suggestion of minimal vascular congestion. Electronically Signed   By: Elberta Fortis M.D.   On: 12/27/2021 09:52    Microbiology: Results for orders placed or performed during the hospital encounter of 12/27/21  Urine Culture     Status: Abnormal   Collection Time: 12/27/21  1:21 PM   Specimen: Urine, Clean Catch  Result Value Ref Range Status   Specimen Description   Final    URINE, CLEAN CATCH Performed at [facility], [address] [place], [place] [address]    Special Requests   Final    NONE Performed at [facility], [address], [place], [place] [address]    Culture >=100,000 COLONIES/mL KLEBSIELLA  PNEUMONIAE (A)  Final   Report Status 12/29/2021 FINAL  Final   Organism ID, Bacteria KLEBSIELLA PNEUMONIAE (A)  Final      Susceptibility   Klebsiella pneumoniae - MIC*    AMPICILLIN >=32 RESISTANT Resistant     CEFAZOLIN <=4 SENSITIVE Sensitive     CEFEPIME <=0.12 SENSITIVE Sensitive     CEFTRIAXONE <=0.25 SENSITIVE Sensitive     CIPROFLOXACIN <=0.25 SENSITIVE Sensitive     GENTAMICIN <=1 SENSITIVE Sensitive     IMIPENEM <=0.25 SENSITIVE Sensitive     NITROFURANTOIN 64 INTERMEDIATE Intermediate     TRIMETH/SULFA <=20 SENSITIVE Sensitive     AMPICILLIN/SULBACTAM 8 SENSITIVE Sensitive     PIP/TAZO 8 SENSITIVE Sensitive     * >=100,000 COLONIES/mL KLEBSIELLA PNEUMONIAE     Labs: CBC: Recent Labs  Lab 12/27/21 0941  WBC 8.2  NEUTROABS 6.8  HGB 10.2*  HCT 34.3*  MCV 70.6*  PLT 243   Basic Metabolic Panel: Recent Labs  Lab 12/27/21 0941  NA 139  K 4.2  CL 105  CO2 24  GLUCOSE 185*  BUN 12  CREATININE 0.84  CALCIUM 9.2   Liver Function Tests: Recent Labs  Lab 12/27/21 0941  AST 19  ALT 12  ALKPHOS 88  BILITOT 0.6  PROT 8.1  ALBUMIN 3.6   CBG: Recent Labs  Lab 12/28/21 1134 12/28/21 1703 12/28/21 2324 12/29/21 0855 12/29/21 1216  GLUCAP 127* 142* 102* 131* 163*    Discharge time spent: less than 30 minutes.  Signed: Pennie Banter, DO [facility] 12/29/2021

## 2021-12-29 NOTE — Progress Notes (Signed)
Patient ambulated independently with front wheeled walker to bathroom and back with nurse tech following closely. Steady gait and patient with purposeful movements in all extremities.

## 2022-01-02 ENCOUNTER — Emergency Department
Admission: EM | Admit: 2022-01-02 | Discharge: 2022-01-02 | Disposition: A | Discharge status: Home / Self Care | Payer: Medicaid Other | Attending: Emergency Medicine | Admitting: Emergency Medicine

## 2022-01-02 ENCOUNTER — Other Ambulatory Visit: Payer: Self-pay

## 2022-01-02 ENCOUNTER — Emergency Department: Payer: Medicaid Other

## 2022-01-02 ENCOUNTER — Encounter: Payer: Self-pay | Admitting: Intensive Care

## 2022-01-02 DIAGNOSIS — K3184 Gastroparesis: Secondary | ICD-10-CM

## 2022-01-02 DIAGNOSIS — R112 Nausea with vomiting, unspecified: Secondary | ICD-10-CM

## 2022-01-02 LAB — COMPREHENSIVE METABOLIC PANEL
ALT: 9 U/L (ref 0–44)
AST: 14 U/L — ABNORMAL LOW (ref 15–41)
Albumin: 3.4 g/dL — ABNORMAL LOW (ref 3.5–5.0)
Alkaline Phosphatase: 76 U/L (ref 38–126)
Anion gap: 8 (ref 5–15)
BUN: 16 mg/dL (ref 6–20)
CO2: 24 mmol/L (ref 22–32)
Calcium: 8.9 mg/dL (ref 8.9–10.3)
Chloride: 108 mmol/L (ref 98–111)
Creatinine, Ser: 0.95 mg/dL (ref 0.44–1.00)
GFR, Estimated: >60 mL/min (ref >60)
Glucose, Bld: 138 mg/dL — ABNORMAL HIGH (ref 70–99)
Potassium: 3.5 mmol/L (ref 3.5–5.1)
Sodium: 140 mmol/L (ref 135–145)
Total Bilirubin: 0.6 mg/dL (ref 0.3–1.2)
Total Protein: 7.4 g/dL (ref 6.5–8.1)

## 2022-01-02 LAB — CBC
HCT: 31.8 % — ABNORMAL LOW (ref 36.0–46.0)
Hemoglobin: 9.6 g/dL — ABNORMAL LOW (ref 12.0–15.0)
MCH: 21.5 pg — ABNORMAL LOW (ref 26.0–34.0)
MCHC: 30.2 g/dL (ref 30.0–36.0)
MCV: 71.3 fL — ABNORMAL LOW (ref 80.0–100.0)
Platelets: 242 10*3/uL (ref 150–400)
RBC: 4.46 MIL/uL (ref 3.87–5.11)
RDW: 17.1 % — ABNORMAL HIGH (ref 11.5–15.5)
WBC: 6.5 10*3/uL (ref 4.0–10.5)
nRBC: 0 % (ref 0.0–0.2)

## 2022-01-02 LAB — TROPONIN I (HIGH SENSITIVITY): Troponin I (High Sensitivity): 3 ng/L (ref <18)

## 2022-01-02 LAB — LIPASE, BLOOD: Lipase: 38 U/L (ref 11–51)

## 2022-01-02 MED ORDER — DIPHENHYDRAMINE HCL 50 MG/ML IJ SOLN
50.0000 mg | Freq: Once | INTRAMUSCULAR | Status: AC
  Administered 2022-01-02: 50 mg via INTRAVENOUS
  Filled 2022-01-02: qty 1

## 2022-01-02 MED ORDER — SODIUM CHLORIDE 0.9 % IV BOLUS
1000.0000 mL | Freq: Once | INTRAVENOUS | Status: AC
  Administered 2022-01-02: 1000 mL via INTRAVENOUS

## 2022-01-02 MED ORDER — DROPERIDOL 2.5 MG/ML IJ SOLN
2.5000 mg | Freq: Once | INTRAMUSCULAR | Status: AC
  Administered 2022-01-02: 2.5 mg via INTRAVENOUS
  Filled 2022-01-02: qty 2

## 2022-01-02 MED ORDER — METOCLOPRAMIDE HCL 5 MG/ML IJ SOLN
10.0000 mg | INTRAMUSCULAR | Status: AC
  Administered 2022-01-02: 10 mg via INTRAVENOUS
  Filled 2022-01-02: qty 2

## 2022-01-02 MED ORDER — SODIUM CHLORIDE 0.9 % IV SOLN
12.5000 mg | Freq: Once | INTRAVENOUS | Status: AC
  Administered 2022-01-02: 12.5 mg via INTRAVENOUS
  Filled 2022-01-02: qty 0.5

## 2022-01-02 NOTE — ED Notes (Signed)
Pt declined discharge vitals signs. Pt states she is ready to go

## 2022-01-02 NOTE — ED Provider Notes (Signed)
[facility] Provider Note    Event Date/Time   First MD Initiated Contact with Patient 01/02/22 1430     (approximate)   History   Chief Complaint: Nausea and Emesis   HPI  [name] [name] is a 61 y.o. female with a history of diabetes, CHF, morbid obesity, CKD, chronic pain who comes ED complaining of symptoms of gastroparesis.  She reports that her upper abdominal pain and vomiting are worse since yesterday.  Unable to eat or drink.  Normal bowel movement today, no constipation.  No fever.  Reports she was recently hospitalized for similar symptoms a week ago.  No chest pain or shortness of breath.     Physical Exam   Triage Vital Signs: ED Triage Vitals  Enc Vitals Group     BP 01/02/22 1416 (!) 130/59     Pulse Rate 01/02/22 1416 70     Resp 01/02/22 1416 20     Temp 01/02/22 1416 98.2 F (36.8 C)     Temp Source 01/02/22 1416 Oral     SpO2 01/02/22 1416 92 %     Weight 01/02/22 1408 (!) 319 lb (144.7 kg)     Height 01/02/22 1408  (1.676 m)     Head Circumference --      Peak Flow --      Pain Score 01/02/22 1408 6     Pain Loc --      Pain Edu? --      Excl. in [place]? --     Most recent vital signs: Vitals:   01/02/22 1416  BP: (!) 130/59  Pulse: 70  Resp: 20  Temp: 98.2 F (36.8 C)  SpO2: 92%    General: Awake, no distress.  CV:  Good peripheral perfusion.  Resp:  Normal effort.  Abd:  No distention.  Soft with mild upper abdominal tenderness on the left upper quadrant.  No peritoneal signs, no tympany with percussion. Other:  No lower extremity edema.  Dry mucous membranes.   ED Results / Procedures / Treatments   Labs (all labs ordered are listed, but only abnormal results are displayed) Labs Reviewed  LIPASE, BLOOD  COMPREHENSIVE METABOLIC PANEL  CBC  URINALYSIS, ROUTINE W REFLEX MICROSCOPIC     EKG    RADIOLOGY X-ray KUB interpreted by me, negative for signs of bowel obstruction.  Radiology report  reviewed.   PROCEDURES:  Procedures   MEDICATIONS ORDERED IN ED: Medications  metoCLOPramide (REGLAN) injection 10 mg (10 mg Intravenous Given 01/02/22 1445)  diphenhydrAMINE (BENADRYL) injection 50 mg (50 mg Intravenous Given 01/02/22 1445)  sodium chloride 0.9 % bolus 1,000 mL (1,000 mLs Intravenous New Bag/Given 01/02/22 1445)     IMPRESSION / MDM / ASSESSMENT AND PLAN / ED COURSE  I reviewed the triage vital signs and the nursing notes.                              Differential diagnosis includes, but is not limited to, AKI, dehydration, electrolyte abnormality, gastroparesis, gastritis, bowel obstruction, opioid withdrawal  Patient's presentation is most consistent with acute presentation with potential threat to life or bodily function.  Patient presents with upper abdominal pain, nausea vomiting.  Recently had a CT scan of the abdomen 1 week ago.  Symptoms are recurrent.  Repeated x-ray today which is negative for any acute changes, no signs of bowel obstruction.  I doubt perforation, diverticulitis, intra-abdominal abscess,  pancreatitis, biliary disease, or AAA/dissection.  Patient given IV Benadryl and Reglan and IV fluids for hydration.  Labs pending.  Care signed out to Dr. Charna Archer.       FINAL CLINICAL IMPRESSION(S) / ED DIAGNOSES   Final diagnoses:  Gastroparesis  Abdominal pain and vomiting   Rx / DC Orders   ED Discharge Orders     None        Note:  This document was prepared using Dragon voice recognition software and may include unintentional dictation errors.   Carrie Mew, MD 01/02/22 [phone]

## 2022-01-02 NOTE — ED Notes (Signed)
Pt verbalized understanding of discharge instructions, prescriptions, and follow-up care instructions. Pt advised if symptoms worsen to return to ED. E-signature not available due to e-signature pad not working.  

## 2022-01-02 NOTE — ED Triage Notes (Addendum)
Patient presents with N/V for months. Reports diagnosed with UTI last week and just finished antibiotics. Hx gastroparesis and neuropathy. Reports zofran does not help her.  Patient crying in triage.

## 2022-01-02 NOTE — ED Notes (Signed)
Pt c/o nausea and Emesis that started today. Pt states she has a hx of gastroparesis. Pt c/o of nausea at this time. Pt reports not vomiting since she has been here.

## 2022-01-02 NOTE — ED Provider Notes (Signed)
-----------------------------------------   3:05 PM on 01/02/2022 -----------------------------------------  Blood pressure (!) 130/59, pulse 70, temperature 98.2 F (36.8 C), temperature source Oral, resp. rate 20, height 5\' 6"  (1.676 m), weight (!) 144.7 kg, last menstrual period 04/20/2001, SpO2 92 %.  Assuming care from Dr. Joni Fears.  In short, [name] [name] is a 61 y.o. female with a chief complaint of Nausea and Emesis .  Refer to the original H&P for additional details.  The current plan of care is to reassess following labs and imaging for nausea and vomiting with abdominal pain.  ED ECG REPORT I, Blake Divine, the attending physician, personally viewed and interpreted this ECG.   Date: 01/02/2022  EKG Time: 16:50  Rate: 70  Rhythm: normal sinus rhythm  Axis: Normal  Intervals:none  ST&T Change: None  ----------------------------------------- 4:53 PM on 01/02/2022 ----------------------------------------- Patient continues to have nausea despite doses of Reglan and Phenergan, EKG shows QTc within normal limits and we will medicate with IV droperidol.  ----------------------------------------- 6:32 PM on 01/02/2022 ----------------------------------------- Patient complained of some left upper quadrant abdominal pain, EKG shows no evidence of arrhythmia or ischemia and troponin within normal limits.  She states she is feeling better following dose of IV droperidol and is requesting to be discharged home.  She states she has nausea medication available at home, was counseled to follow-up with her PCP and to return to the ED for new or worsening symptoms, patient agrees with plan.     Blake Divine, MD 01/02/22 [phone]

## 2022-01-02 NOTE — ED Notes (Signed)
Patient transported to X-ray 

## 2022-01-04 ENCOUNTER — Emergency Department: Payer: Medicaid Other

## 2022-01-04 ENCOUNTER — Inpatient Hospital Stay
Admission: EM | Admit: 2022-01-04 | Discharge: 2022-01-05 | DRG: 315 | Disposition: A | Discharge status: Home Health | Payer: Medicaid Other | Attending: Internal Medicine | Admitting: Internal Medicine

## 2022-01-04 DIAGNOSIS — Z8249 Family history of ischemic heart disease and other diseases of the circulatory system: Secondary | ICD-10-CM

## 2022-01-04 DIAGNOSIS — Z818 Family history of other mental and behavioral disorders: Secondary | ICD-10-CM

## 2022-01-04 DIAGNOSIS — H538 Other visual disturbances: Secondary | ICD-10-CM | POA: Diagnosis present

## 2022-01-04 DIAGNOSIS — F410 Panic disorder [episodic paroxysmal anxiety] without agoraphobia: Secondary | ICD-10-CM | POA: Diagnosis present

## 2022-01-04 DIAGNOSIS — Z950 Presence of cardiac pacemaker: Secondary | ICD-10-CM | POA: Diagnosis not present

## 2022-01-04 DIAGNOSIS — G4730 Sleep apnea, unspecified: Secondary | ICD-10-CM | POA: Diagnosis present

## 2022-01-04 DIAGNOSIS — F319 Bipolar disorder, unspecified: Secondary | ICD-10-CM | POA: Diagnosis present

## 2022-01-04 DIAGNOSIS — I959 Hypotension, unspecified: Principal | ICD-10-CM | POA: Diagnosis present

## 2022-01-04 DIAGNOSIS — J449 Chronic obstructive pulmonary disease, unspecified: Secondary | ICD-10-CM | POA: Diagnosis present

## 2022-01-04 DIAGNOSIS — R55 Syncope and collapse: Secondary | ICD-10-CM | POA: Diagnosis not present

## 2022-01-04 DIAGNOSIS — Z79899 Other long term (current) drug therapy: Secondary | ICD-10-CM

## 2022-01-04 DIAGNOSIS — I480 Paroxysmal atrial fibrillation: Secondary | ICD-10-CM | POA: Diagnosis present

## 2022-01-04 DIAGNOSIS — A419 Sepsis, unspecified organism: Secondary | ICD-10-CM | POA: Insufficient documentation

## 2022-01-04 DIAGNOSIS — Z885 Allergy status to narcotic agent status: Secondary | ICD-10-CM

## 2022-01-04 DIAGNOSIS — E860 Dehydration: Secondary | ICD-10-CM | POA: Diagnosis present

## 2022-01-04 DIAGNOSIS — A084 Viral intestinal infection, unspecified: Secondary | ICD-10-CM | POA: Diagnosis present

## 2022-01-04 DIAGNOSIS — E876 Hypokalemia: Secondary | ICD-10-CM | POA: Diagnosis present

## 2022-01-04 DIAGNOSIS — D509 Iron deficiency anemia, unspecified: Secondary | ICD-10-CM | POA: Diagnosis present

## 2022-01-04 DIAGNOSIS — Z961 Presence of intraocular lens: Secondary | ICD-10-CM | POA: Diagnosis present

## 2022-01-04 DIAGNOSIS — Z888 Allergy status to other drugs, medicaments and biological substances status: Secondary | ICD-10-CM

## 2022-01-04 DIAGNOSIS — E1143 Type 2 diabetes mellitus with diabetic autonomic (poly)neuropathy: Secondary | ICD-10-CM | POA: Diagnosis present

## 2022-01-04 DIAGNOSIS — K219 Gastro-esophageal reflux disease without esophagitis: Secondary | ICD-10-CM | POA: Diagnosis present

## 2022-01-04 DIAGNOSIS — Z79890 Hormone replacement therapy (postmenopausal): Secondary | ICD-10-CM

## 2022-01-04 DIAGNOSIS — N179 Acute kidney failure, unspecified: Secondary | ICD-10-CM | POA: Diagnosis present

## 2022-01-04 DIAGNOSIS — Z87891 Personal history of nicotine dependence: Secondary | ICD-10-CM

## 2022-01-04 DIAGNOSIS — Z8616 Personal history of COVID-19: Secondary | ICD-10-CM

## 2022-01-04 DIAGNOSIS — Z813 Family history of other psychoactive substance abuse and dependence: Secondary | ICD-10-CM

## 2022-01-04 DIAGNOSIS — Z6841 Body Mass Index (BMI) 40.0 and over, adult: Secondary | ICD-10-CM | POA: Diagnosis not present

## 2022-01-04 DIAGNOSIS — E1142 Type 2 diabetes mellitus with diabetic polyneuropathy: Secondary | ICD-10-CM | POA: Diagnosis present

## 2022-01-04 DIAGNOSIS — Z9841 Cataract extraction status, right eye: Secondary | ICD-10-CM

## 2022-01-04 DIAGNOSIS — M069 Rheumatoid arthritis, unspecified: Secondary | ICD-10-CM | POA: Diagnosis present

## 2022-01-04 DIAGNOSIS — E785 Hyperlipidemia, unspecified: Secondary | ICD-10-CM | POA: Diagnosis present

## 2022-01-04 DIAGNOSIS — I110 Hypertensive heart disease with heart failure: Secondary | ICD-10-CM | POA: Diagnosis present

## 2022-01-04 DIAGNOSIS — R6510 Systemic inflammatory response syndrome (SIRS) of non-infectious origin without acute organ dysfunction: Secondary | ICD-10-CM | POA: Diagnosis present

## 2022-01-04 DIAGNOSIS — Z20822 Contact with and (suspected) exposure to covid-19: Secondary | ICD-10-CM | POA: Diagnosis present

## 2022-01-04 DIAGNOSIS — Z7901 Long term (current) use of anticoagulants: Secondary | ICD-10-CM

## 2022-01-04 DIAGNOSIS — E872 Acidosis, unspecified: Secondary | ICD-10-CM | POA: Diagnosis present

## 2022-01-04 DIAGNOSIS — N319 Neuromuscular dysfunction of bladder, unspecified: Secondary | ICD-10-CM | POA: Diagnosis present

## 2022-01-04 DIAGNOSIS — E039 Hypothyroidism, unspecified: Secondary | ICD-10-CM | POA: Diagnosis present

## 2022-01-04 DIAGNOSIS — Z9049 Acquired absence of other specified parts of digestive tract: Secondary | ICD-10-CM

## 2022-01-04 DIAGNOSIS — Z9842 Cataract extraction status, left eye: Secondary | ICD-10-CM

## 2022-01-04 DIAGNOSIS — I5032 Chronic diastolic (congestive) heart failure: Secondary | ICD-10-CM | POA: Diagnosis present

## 2022-01-04 DIAGNOSIS — R7989 Other specified abnormal findings of blood chemistry: Secondary | ICD-10-CM | POA: Diagnosis present

## 2022-01-04 DIAGNOSIS — Z8701 Personal history of pneumonia (recurrent): Secondary | ICD-10-CM

## 2022-01-04 DIAGNOSIS — Z881 Allergy status to other antibiotic agents status: Secondary | ICD-10-CM

## 2022-01-04 DIAGNOSIS — Z8614 Personal history of Methicillin resistant Staphylococcus aureus infection: Secondary | ICD-10-CM

## 2022-01-04 DIAGNOSIS — Z90710 Acquired absence of both cervix and uterus: Secondary | ICD-10-CM

## 2022-01-04 DIAGNOSIS — Z7985 Long-term (current) use of injectable non-insulin antidiabetic drugs: Secondary | ICD-10-CM

## 2022-01-04 DIAGNOSIS — Z7984 Long term (current) use of oral hypoglycemic drugs: Secondary | ICD-10-CM

## 2022-01-04 DIAGNOSIS — Z811 Family history of alcohol abuse and dependence: Secondary | ICD-10-CM

## 2022-01-04 DIAGNOSIS — M542 Cervicalgia: Secondary | ICD-10-CM | POA: Diagnosis present

## 2022-01-04 DIAGNOSIS — E119 Type 2 diabetes mellitus without complications: Secondary | ICD-10-CM

## 2022-01-04 LAB — CBC WITH DIFFERENTIAL/PLATELET
Abs Immature Granulocytes: 0.11 10*3/uL — ABNORMAL HIGH (ref 0.00–0.07)
Basophils Absolute: 0.1 10*3/uL (ref 0.0–0.1)
Basophils Relative: 1 %
Eosinophils Absolute: 0 10*3/uL (ref 0.0–0.5)
Eosinophils Relative: 0 %
HCT: 36.4 % (ref 36.0–46.0)
Hemoglobin: 10.5 g/dL — ABNORMAL LOW (ref 12.0–15.0)
Immature Granulocytes: 1 %
Lymphocytes Relative: 14 %
Lymphs Abs: 2.2 10*3/uL (ref 0.7–4.0)
MCH: 21.3 pg — ABNORMAL LOW (ref 26.0–34.0)
MCHC: 28.8 g/dL — ABNORMAL LOW (ref 30.0–36.0)
MCV: 73.8 fL — ABNORMAL LOW (ref 80.0–100.0)
Monocytes Absolute: 1.2 10*3/uL — ABNORMAL HIGH (ref 0.1–1.0)
Monocytes Relative: 7 %
Neutro Abs: 12.6 10*3/uL — ABNORMAL HIGH (ref 1.7–7.7)
Neutrophils Relative %: 77 %
Platelets: 301 10*3/uL (ref 150–400)
RBC: 4.93 MIL/uL (ref 3.87–5.11)
RDW: 17.6 % — ABNORMAL HIGH (ref 11.5–15.5)
WBC: 16.2 10*3/uL — ABNORMAL HIGH (ref 4.0–10.5)
nRBC: 0 % (ref 0.0–0.2)

## 2022-01-04 LAB — URINALYSIS, ROUTINE W REFLEX MICROSCOPIC
Bacteria, UA: NONE SEEN
Bilirubin Urine: NEGATIVE
Glucose, UA: NEGATIVE mg/dL
Hgb urine dipstick: NEGATIVE
Ketones, ur: NEGATIVE mg/dL
Leukocytes,Ua: NEGATIVE
Nitrite: NEGATIVE
Protein, ur: 30 mg/dL — AB
Specific Gravity, Urine: 1.015 (ref 1.005–1.030)
Squamous Epithelial / HPF: NONE SEEN (ref 0–5)
pH: 6 (ref 5.0–8.0)

## 2022-01-04 LAB — COMPREHENSIVE METABOLIC PANEL
ALT: 10 U/L (ref 0–44)
AST: 19 U/L (ref 15–41)
Albumin: 3.5 g/dL (ref 3.5–5.0)
Alkaline Phosphatase: 79 U/L (ref 38–126)
Anion gap: 13 (ref 5–15)
BUN: 18 mg/dL (ref 6–20)
CO2: 20 mmol/L — ABNORMAL LOW (ref 22–32)
Calcium: 8.8 mg/dL — ABNORMAL LOW (ref 8.9–10.3)
Chloride: 106 mmol/L (ref 98–111)
Creatinine, Ser: 1.89 mg/dL — ABNORMAL HIGH (ref 0.44–1.00)
GFR, Estimated: 30 mL/min — ABNORMAL LOW (ref >60)
Glucose, Bld: 177 mg/dL — ABNORMAL HIGH (ref 70–99)
Potassium: 3.3 mmol/L — ABNORMAL LOW (ref 3.5–5.1)
Sodium: 139 mmol/L (ref 135–145)
Total Bilirubin: 0.5 mg/dL (ref 0.3–1.2)
Total Protein: 7.7 g/dL (ref 6.5–8.1)

## 2022-01-04 LAB — LACTIC ACID, PLASMA
Lactic Acid, Venous: 4.8 mmol/L (ref 0.5–1.9)
Lactic Acid, Venous: 4.4 mmol/L (ref 0.5–1.9)
Lactic Acid, Venous: 1.2 mmol/L (ref 0.5–1.9)
Lactic Acid, Venous: 1.5 mmol/L (ref 0.5–1.9)

## 2022-01-04 LAB — CBG MONITORING, ED: Glucose-Capillary: 182 mg/dL — ABNORMAL HIGH (ref 70–99)

## 2022-01-04 LAB — MAGNESIUM: Magnesium: 1.5 mg/dL — ABNORMAL LOW (ref 1.7–2.4)

## 2022-01-04 LAB — SARS CORONAVIRUS 2 BY RT PCR: SARS Coronavirus 2 by RT PCR: NEGATIVE

## 2022-01-04 LAB — TYPE AND SCREEN
ABO/RH(D): A POS
Antibody Screen: NEGATIVE

## 2022-01-04 LAB — HEMOGLOBIN A1C
Hgb A1c MFr Bld: 6.8 % — ABNORMAL HIGH (ref 4.8–5.6)
Mean Plasma Glucose: 148.46 mg/dL

## 2022-01-04 LAB — GLUCOSE, CAPILLARY: Glucose-Capillary: 163 mg/dL — ABNORMAL HIGH (ref 70–99)

## 2022-01-04 LAB — LIPASE, BLOOD: Lipase: 39 U/L (ref 11–51)

## 2022-01-04 MED ORDER — APIXABAN 5 MG PO TABS
5.0000 mg | ORAL_TABLET | Freq: Two times a day (BID) | ORAL | Status: DC
  Administered 2022-01-04 – 2022-01-05 (×3): 5 mg via ORAL
  Filled 2022-01-04 (×3): qty 1

## 2022-01-04 MED ORDER — METRONIDAZOLE 500 MG/100ML IV SOLN
500.0000 mg | Freq: Once | INTRAVENOUS | Status: AC
  Administered 2022-01-04: 500 mg via INTRAVENOUS
  Filled 2022-01-04: qty 100

## 2022-01-04 MED ORDER — SODIUM CHLORIDE 0.9 % IV BOLUS
1000.0000 mL | Freq: Once | INTRAVENOUS | Status: AC
  Administered 2022-01-04: 1000 mL via INTRAVENOUS

## 2022-01-04 MED ORDER — METOPROLOL TARTRATE 5 MG/5ML IV SOLN: 2.5000 mg | Freq: Four times a day (QID) | INTRAVENOUS | Status: DC | PRN

## 2022-01-04 MED ORDER — VANCOMYCIN HCL IN DEXTROSE 1-5 GM/200ML-% IV SOLN
1000.0000 mg | Freq: Once | INTRAVENOUS | Status: AC
  Administered 2022-01-04: 1000 mg via INTRAVENOUS
  Filled 2022-01-04: qty 200

## 2022-01-04 MED ORDER — ACETAMINOPHEN 325 MG PO TABS
650.0000 mg | ORAL_TABLET | Freq: Four times a day (QID) | ORAL | Status: DC | PRN
  Administered 2022-01-04: 650 mg via ORAL
  Filled 2022-01-04: qty 2

## 2022-01-04 MED ORDER — BUSPIRONE HCL 10 MG PO TABS
10.0000 mg | ORAL_TABLET | Freq: Two times a day (BID) | ORAL | Status: DC
  Administered 2022-01-04 – 2022-01-05 (×3): 10 mg via ORAL
  Filled 2022-01-04: qty 2
  Filled 2022-01-04 (×2): qty 1

## 2022-01-04 MED ORDER — HYDROXYCHLOROQUINE SULFATE 200 MG PO TABS
200.0000 mg | ORAL_TABLET | Freq: Two times a day (BID) | ORAL | Status: DC
  Administered 2022-01-04 – 2022-01-05 (×3): 200 mg via ORAL
  Filled 2022-01-04 (×4): qty 1

## 2022-01-04 MED ORDER — ATORVASTATIN CALCIUM 80 MG PO TABS
80.0000 mg | ORAL_TABLET | Freq: Every evening | ORAL | Status: DC
  Administered 2022-01-04 – 2022-01-05 (×2): 80 mg via ORAL
  Filled 2022-01-04 (×2): qty 1

## 2022-01-04 MED ORDER — SODIUM CHLORIDE 0.9 % IV SOLN: INTRAVENOUS | Status: AC

## 2022-01-04 MED ORDER — PREGABALIN 50 MG PO CAPS
100.0000 mg | ORAL_CAPSULE | Freq: Three times a day (TID) | ORAL | Status: DC
  Administered 2022-01-04 – 2022-01-05 (×5): 100 mg via ORAL
  Filled 2022-01-04 (×5): qty 2

## 2022-01-04 MED ORDER — POTASSIUM CHLORIDE CRYS ER 20 MEQ PO TBCR
40.0000 meq | EXTENDED_RELEASE_TABLET | Freq: Once | ORAL | Status: AC
  Administered 2022-01-04: 40 meq via ORAL
  Filled 2022-01-04: qty 2

## 2022-01-04 MED ORDER — FLUOXETINE HCL 20 MG PO CAPS
40.0000 mg | ORAL_CAPSULE | Freq: Every day | ORAL | Status: DC
  Administered 2022-01-04 – 2022-01-05 (×2): 40 mg via ORAL
  Filled 2022-01-04 (×2): qty 2

## 2022-01-04 MED ORDER — VANCOMYCIN HCL 2000 MG/400ML IV SOLN: 2000.0000 mg | Freq: Once | INTRAVENOUS | Status: DC

## 2022-01-04 MED ORDER — LACTATED RINGERS IV BOLUS
1000.0000 mL | Freq: Once | INTRAVENOUS | Status: AC
  Administered 2022-01-04: 1000 mL via INTRAVENOUS

## 2022-01-04 MED ORDER — FAMOTIDINE 20 MG PO TABS
20.0000 mg | ORAL_TABLET | Freq: Every day | ORAL | Status: DC
  Administered 2022-01-04 – 2022-01-05 (×2): 20 mg via ORAL
  Filled 2022-01-04 (×2): qty 1

## 2022-01-04 MED ORDER — METOCLOPRAMIDE HCL 5 MG PO TABS: 5.0000 mg | ORAL_TABLET | Freq: Four times a day (QID) | ORAL | Status: DC | PRN

## 2022-01-04 MED ORDER — VANCOMYCIN HCL 1500 MG/300ML IV SOLN
1500.0000 mg | Freq: Once | INTRAVENOUS | Status: AC
  Administered 2022-01-04: 1500 mg via INTRAVENOUS
  Filled 2022-01-04: qty 300

## 2022-01-04 MED ORDER — PRAMIPEXOLE DIHYDROCHLORIDE 0.25 MG PO TABS
0.2500 mg | ORAL_TABLET | Freq: Every day | ORAL | Status: DC
  Administered 2022-01-04: 0.25 mg via ORAL
  Filled 2022-01-04 (×2): qty 1

## 2022-01-04 MED ORDER — OXYCODONE-ACETAMINOPHEN 5-325 MG PO TABS
1.0000 | ORAL_TABLET | Freq: Three times a day (TID) | ORAL | Status: DC | PRN
  Administered 2022-01-04 – 2022-01-05 (×2): 1 via ORAL
  Filled 2022-01-04 (×2): qty 1

## 2022-01-04 MED ORDER — FOLIC ACID 1 MG PO TABS
1.0000 mg | ORAL_TABLET | Freq: Every day | ORAL | Status: DC
  Administered 2022-01-04 – 2022-01-05 (×2): 1 mg via ORAL
  Filled 2022-01-04 (×2): qty 1

## 2022-01-04 MED ORDER — INSULIN ASPART 100 UNIT/ML IJ SOLN
0.0000 [IU] | Freq: Three times a day (TID) | INTRAMUSCULAR | Status: DC
  Administered 2022-01-04: 4 [IU] via SUBCUTANEOUS
  Administered 2022-01-04: 3 [IU] via SUBCUTANEOUS
  Filled 2022-01-04 (×2): qty 1

## 2022-01-04 MED ORDER — LEVOTHYROXINE SODIUM 88 MCG PO TABS
88.0000 ug | ORAL_TABLET | Freq: Every day | ORAL | Status: DC
  Administered 2022-01-05: 88 ug via ORAL
  Filled 2022-01-04: qty 1

## 2022-01-04 MED ORDER — QUETIAPINE FUMARATE 300 MG PO TABS
300.0000 mg | ORAL_TABLET | Freq: Every day | ORAL | Status: DC
  Administered 2022-01-04: 300 mg via ORAL
  Filled 2022-01-04 (×2): qty 1

## 2022-01-04 MED ORDER — INSULIN ASPART 100 UNIT/ML IJ SOLN: 0.0000 [IU] | Freq: Every day | INTRAMUSCULAR | Status: DC

## 2022-01-04 MED ORDER — BUDESON-GLYCOPYRROL-FORMOTEROL 160-9-4.8 MCG/ACT IN AERO: 2.0000 | INHALATION_SPRAY | Freq: Two times a day (BID) | RESPIRATORY_TRACT | Status: DC

## 2022-01-04 MED ORDER — ALBUTEROL SULFATE (2.5 MG/3ML) 0.083% IN NEBU: 2.5000 mg | INHALATION_SOLUTION | Freq: Four times a day (QID) | RESPIRATORY_TRACT | Status: DC | PRN

## 2022-01-04 MED ORDER — UMECLIDINIUM-VILANTEROL 62.5-25 MCG/ACT IN AEPB
1.0000 | INHALATION_SPRAY | Freq: Every day | RESPIRATORY_TRACT | Status: DC
  Administered 2022-01-05: 1 via RESPIRATORY_TRACT
  Filled 2022-01-04: qty 14

## 2022-01-04 MED ORDER — ZOLPIDEM TARTRATE 5 MG PO TABS: 5.0000 mg | ORAL_TABLET | Freq: Every evening | ORAL | Status: DC | PRN

## 2022-01-04 MED ORDER — SODIUM CHLORIDE 0.9 % IV SOLN
2.0000 g | Freq: Once | INTRAVENOUS | Status: AC
  Administered 2022-01-04: 2 g via INTRAVENOUS
  Filled 2022-01-04: qty 12.5

## 2022-01-04 NOTE — ED Provider Notes (Signed)
[facility] Provider Note    Event Date/Time   First MD Initiated Contact with Patient 01/04/22 [phone]     (approximate)   History   Fall and Hypotension   HPI  [name] [name] is a 61 y.o. female with past medical history of bipolar disorder, COPD, diastolic heart failure, hypertension hyperlipidemia paroxysmal A-fib rheumatoid arthritis and gastroparesis who presents with low blood pressure and a fall.  Patient got up to use the bathroom and felt lightheaded and fell onto her buttock.  Did not hit her head but is complaining of neck pain.  She has felt lightheaded.  Since Friday she has had nausea vomiting and diarrhea.  Unable to count episodes of emesis.  About 4-5 episodes of green stool per day.  No blood.  Denies hematemesis or blood in her stool.  She has some upper abdominal pain which has been going on since this started.  Denies urinary symptoms denies fevers or chills.  No chest pain or shortness of breath.  Patient was seen in the ED 2 days ago for similar.  CT abdomen pelvis was done which did not have acute findings.  Patient felt improved after meds and was discharged.  She notes that her nausea vomiting diarrhea have overall improved and are slowing down.  She is tolerating p.o.     Past Medical History:  Diagnosis Date   Abdominal pain 02/12/2019   Abdominal wall hernia [id number]   Acute metabolic encephalopathy [id number]   AMS (altered mental status) 12/28/2021   Anxiety    Arthritis    Rheumatoid   C. difficile colitis    Chronic diastolic heart failure ([place])    COVID-19 03/23/2019   Diagnosed at [facility] (send-out) on 03/23/2019   Depression    Diabetes mellitus    states no meds or diet restrictions  at present   Diastolic CHF (HCC)    Esophagitis    Fluid retention    GERD (gastroesophageal reflux disease)    Hiatal hernia    Hypertension    Hypokalemia due to loss of potassium 10/21/2015   Overview:  Associated with 3 weeks of diarrhea   And QT prolongation.   Hypothyroidism    IBS (irritable bowel syndrome)    Moderate episode of recurrent major depressive disorder ([place]) 06/03/2004   Morbid obesity (HCC)    MRSA (methicillin resistant Staphylococcus aureus) infection 11/2017   left inner thigh abcess   Neurogenic bladder    has pacemaker   Neuropathy    Obesity    Panic attacks    Pneumonia due to COVID-19 virus    Rheumatoid arthritis ([place])    Sleep apnea    STATES SEVERE, CANT TOLERATE MASK- LAST STUDY YEARS AGO    Patient Active Problem List   Diagnosis Date Noted   UTI (urinary tract infection) 12/27/2021   Morbid obesity ([place])    Depression with anxiety    PAF (paroxysmal atrial fibrillation) ([place])    Paroxysmal A-fib ([place])    Morbid obesity with BMI of 50.0-59.9, adult ([place]) 07/08/2019   Type 2 diabetes mellitus without complication, without long-term current use of insulin ([place]) 04/06/2019   CKD (chronic kidney disease), stage III ([place]) 04/06/2019   HLD (hyperlipidemia) 04/06/2019   Chronic pain syndrome 03/31/2016   Rheumatoid arthritis ([place]) 12/25/2015   Osteoarthritis, multiple sites 12/24/2015   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[employer]) 12/23/2015   Long term current use of opiate analgesic 12/23/2015   Long term prescription opiate  use 12/23/2015   Diabetic peripheral neuropathy ([place]) 12/23/2015   Chronic diastolic CHF (congestive heart failure) ([place]) 07/03/2014   COPD (chronic obstructive pulmonary disease) ([place]) 01/03/2014   Bipolar disorder, unspecified ([place]) 01/03/2014   Borderline personality disorder ([place]) 01/06/2012   Hypothyroidism 06/28/2010   Rheumatoid arthritis involving multiple sites with positive rheumatoid factor ([place]) 06/28/2010   Essential (primary) hypertension 01/27/2005   Major depressive disorder, recurrent episode, moderate ([place]) 06/03/2004     Physical Exam  Triage Vital Signs: ED Triage Vitals [01/04/22 0225]  Enc Vitals Group     BP 104/60      Pulse Rate 69     Resp 18     Temp 98 F (36.7 C)     Temp src      SpO2 96 %     Weight      Height      Head Circumference      Peak Flow      Pain Score      Pain Loc      Pain Edu?      Excl. in [place]?     Most recent vital signs: Vitals:   01/04/22 0330 01/04/22 0443  BP: 94/63 (!) 107/55  Pulse: 63 73  Resp: 17 19  Temp:    SpO2: 98% 94%     General: Awake, no distress.  CV:  Good peripheral perfusion.  Resp:  Normal effort.  Abd:  No distention.  Mild tenderness in the left upper quadrant, abdomen is obese, soft Neuro:             Awake, Alert, Oriented x 3  Other:  No midline C-spine tenderness, no signs of trauma to the head Dry mucous membranes   ED Results / Procedures / Treatments  Labs (all labs ordered are listed, but only abnormal results are displayed) Labs Reviewed  COMPREHENSIVE METABOLIC PANEL - Abnormal; Notable for the following components:      Result Value   Potassium 3.3 (*)    CO2 20 (*)    Glucose, Bld 177 (*)    Creatinine, Ser 1.89 (*)    Calcium 8.8 (*)    GFR, Estimated 30 (*)    All other components within normal limits  CBC WITH DIFFERENTIAL/PLATELET - Abnormal; Notable for the following components:   WBC 16.2 (*)    Hemoglobin 10.5 (*)    MCV 73.8 (*)    MCH 21.3 (*)    MCHC 28.8 (*)    RDW 17.6 (*)    Neutro Abs 12.6 (*)    Monocytes Absolute 1.2 (*)    Abs Immature Granulocytes 0.11 (*)    All other components within normal limits  URINALYSIS, ROUTINE W REFLEX MICROSCOPIC - Abnormal; Notable for the following components:   Color, Urine YELLOW (*)    APPearance HAZY (*)    Protein, ur 30 (*)    All other components within normal limits  LACTIC ACID, PLASMA - Abnormal; Notable for the following components:   Lactic Acid, Venous 4.8 (*)    All other components within normal limits  CULTURE, BLOOD (ROUTINE X 2)  CULTURE, BLOOD (ROUTINE X 2)  URINE CULTURE  LIPASE, BLOOD  LACTIC ACID, PLASMA  TYPE AND SCREEN      EKG  EKG interpretation performed by myself: NSR, nml axis, prolonged QT interval, no acute ischemic changes    RADIOLOGY CT abdomen pelvis reviewed interpreted myself is negative for acute abnormality.   PROCEDURES:  Critical Care  performed: Yes, see critical care procedure note(s)  .1-3 Lead EKG Interpretation  Performed by: Rada Hay, MD Authorized by: Rada Hay, MD     Interpretation: normal     ECG rate assessment: normal     Rhythm: sinus rhythm     Ectopy: none     Conduction: normal   .Critical Care  Performed by: Rada Hay, MD Authorized by: Rada Hay, MD   Critical care provider statement:    Critical care time (minutes):  30   Critical care was time spent personally by me on the following activities:  Development of treatment plan with patient or surrogate, discussions with consultants, evaluation of patient's response to treatment, examination of patient, ordering and review of laboratory studies, ordering and review of radiographic studies, ordering and performing treatments and interventions, pulse oximetry, re-evaluation of patient's condition and review of old charts   The patient is on the cardiac monitor to evaluate for evidence of arrhythmia and/or significant heart rate changes.   MEDICATIONS ORDERED IN ED: Medications  ceFEPIme (MAXIPIME) 2 g in sodium chloride 0.9 % 100 mL IVPB (has no administration in time range)  vancomycin (VANCOREADY) IVPB 2000 mg/400 mL (has no administration in time range)  metroNIDAZOLE (FLAGYL) IVPB 500 mg (has no administration in time range)  lactated ringers bolus 1,000 mL (0 mLs Intravenous Stopped 01/04/22 0347)  lactated ringers bolus 1,000 mL (1,000 mLs Intravenous New Bag/Given 01/04/22 0347)     IMPRESSION / MDM / ASSESSMENT AND PLAN / ED COURSE  I reviewed the triage vital signs and the nursing notes.                              Patient's presentation is most  consistent with acute presentation with potential threat to life or bodily function.  Differential diagnosis includes, but is not limited to, hypovolemia secondary to GI losses, anemia, intra-abdominal sepsis, sepsis from UTI  The patient is a 57-year-old female with multiple medical comorbidities who presents after a fall.  She got up to use the bathroom felt lightheaded and fell onto her buttock.  Per EMS initially her blood pressure was 60 palp.  She received some fluid and they were able to then get a palpable radial pulse but was still about the [id number] systolic.  On arrival to the ED patient is mentating normally.  Appears dry.  Blood pressure 100/60 without tachycardia rest of her vitals are within normal limits.  Tells me she has had several days of nausea vomiting and diarrhea.  Not tolerating much p.o.  She felt lightheaded tonight which I suspect was in the setting of hypotension.  Denies any blood in her stool or hematemesis.  No fevers.  She was seen in the ED 2 days ago and had her abdomen scanned and this did not show anything acute she felt improved was able to be discharged.  Will give fluids check labs.    Patient was hypotensive with EMS.  Blood pressure 100s over 50s here.  Her abdomen is obese is mildly tender in the midepigastric region but overall is benign.  There is no signs of trauma.  She does look dry.  Labs are notable for leukocytosis of 16 lactate of 4.8, creatinine of 1.9.  CT abdomen pelvis does not have any acute findings.  I suspect that this is enlarged due to volume depletion however with her leukocytosis elevated lactate and hypotensive cannot rule  out sepsis.  Patient's UA with 10-20 white cells.  She has had recent UTI.  Will cover broadly with cefepime bank and Flagyl.  Patient will require admission   Clinical Course as of 01/04/22 0527  Mon Jan 04, 2022  0454 WBC, UA: 11-20 [KM]    Clinical Course User Index [KM] Rada Hay, MD     FINAL CLINICAL  IMPRESSION(S) / ED DIAGNOSES   Final diagnoses:  Hypotension, unspecified hypotension type  Syncope, unspecified syncope type  AKI (acute kidney injury) ([place])     Rx / DC Orders   ED Discharge Orders     None        Note:  This document was prepared using Dragon voice recognition software and may include unintentional dictation errors.   Rada Hay, MD 01/04/22 [phone]

## 2022-01-04 NOTE — ED Notes (Signed)
Patient transported back from CT 

## 2022-01-04 NOTE — ED Triage Notes (Signed)
Pt c/o possible syncopal epsiode and fell. Per EMS they got a low BP 25'E systolic. Pt is alert and oriented at this time. IVF running.

## 2022-01-04 NOTE — ED Notes (Signed)
ED Provider at bedside. 

## 2022-01-04 NOTE — H&P (Addendum)
History and Physical    [name] [name] NOM:[id number] DOB: Dec 16, 1960 DOA: 01/04/2022  PCP: Verita Lamb, NP (Confirm with patient/family/NH records and if not entered, this has to be entered at [facility] point of entry) Patient coming from: Home  I have personally briefly reviewed patient's old medical records in [place]  Chief Complaint: Nauseous vomiting diarrhea and lightheadedness  HPI: [name] [name] is a 61 y.o. female with medical history significant of HTN, IIDM, chronic HFpEF, PAF on Eliquis, COPD, bipolar disorder, rheumatoid arthritis, morbid obesity, gastroparesis, diabetic neuropathy, hypothyroidism, chronic microcytic anemia, presented with nauseous vomiting diarrhea abdominal pain and weakness x3 days.  Symptoms started 3 days ago on Friday, with new onset of nauseous vomiting cramping-like epigastric abdominal pain and loose diarrhea 4-5 times a day greenish smelly stool.  Some episode correlated with tenesmus, no fever or chills.  Feeling nauseous, occasional vomiting of stomach content, very poor appetite.  Symptoms persisted through Sunday, and yesterday afternoon, she rose up to go to [employer] and started to feel lightheadedness and blurry vision, had to sit down and lay down to avoid passing out.  She stopped taking all her blood pressure medications to avoid low blood pressure.  She was only able to drink a few sips of liquid " cannot keep anything down".  She came to the ED 2 days ago was given IV fluid and reassured and sent home.  But given the new onset of lightheadedness and worsening of weakness since yesterday she decided to come in today. ED Course: Blood pressure borderline low, afebrile, not tachycardia.  CT abdomen  Negative for other acute findings.   AST 19, ALT 10, bilirubin 0.5, CBC, WBC 16, hemoglobin 10.5.  Patient was given IV bolus 2000 mL in the ED and broad-spectrum antibiotics vancomycin cefepime and Flagyl x1.  Review of Systems: As per HPI  otherwise 14 point review of systems negative.    Past Medical History:  Diagnosis Date   Abdominal pain 02/12/2019   Abdominal wall hernia 96/28/3662   Acute metabolic encephalopathy 9/47/6546   AMS (altered mental status) 12/28/2021   Anxiety    Arthritis    Rheumatoid   C. difficile colitis    Chronic diastolic heart failure ([place])    COVID-19 03/23/2019   Diagnosed at [facility] (send-out) on 03/23/2019   Depression    Diabetes mellitus    states no meds or diet restrictions  at present   Diastolic CHF (HCC)    Esophagitis    Fluid retention    GERD (gastroesophageal reflux disease)    Hiatal hernia    Hypertension    Hypokalemia due to loss of potassium 10/21/2015   Overview:  Associated with 3 weeks of diarrhea  And QT prolongation.   Hypothyroidism    IBS (irritable bowel syndrome)    Moderate episode of recurrent major depressive disorder ([place]) 06/03/2004   Morbid obesity (HCC)    MRSA (methicillin resistant Staphylococcus aureus) infection 11/2017   left inner thigh abcess   Neurogenic bladder    has pacemaker   Neuropathy    Obesity    Panic attacks    Pneumonia due to COVID-19 virus    Rheumatoid arthritis ([place])    Sleep apnea    STATES SEVERE, CANT TOLERATE MASK- LAST STUDY YEARS AGO    Past Surgical History:  Procedure Laterality Date   ABDOMINAL HYSTERECTOMY     CHOLECYSTECTOMY     DG GREAT TOE RIGHT FOOT  02/23/2018   EYE SURGERY  bilateral cataract extraction with IOL   FOOT SURGERY Left 05/13/2021   [facility]   HERNIA REPAIR     ventral hernia with strangulation   IRRIGATION AND DEBRIDEMENT FOOT Left 01/11/2020   Procedure: IRRIGATION AND DEBRIDEMENT FOOT;  Surgeon: Gwyneth Revels, DPM;  Location: [facility] ORS;  Service: Podiatry;  Laterality: Left;   LAPAROSCOPIC GASTRIC BANDING  03/20/2007   TEE WITHOUT CARDIOVERSION N/A 07/16/2019   Procedure: TRANSESOPHAGEAL ECHOCARDIOGRAM (TEE);  Surgeon: Debbe Odea, MD;  Location: [facility] ORS;  Service:  Cardiovascular;  Laterality: N/A;   TONSILLECTOMY     TUBAL LIGATION       reports that she quit smoking about 22 years ago. Her smoking use included cigarettes. She has a 54.00 pack-year smoking history. She has never used smokeless tobacco. She reports that she does not drink alcohol and does not use drugs.  Allergies  Allergen Reactions   Cephalexin Hives   Codeine Palpitations, Nausea Only, Nausea And Vomiting, Rash and Shortness Of Breath    "makes heart fly, she gets flushed and passes out"   Doxycycline Rash   Propoxyphene Rash and Shortness Of Breath    Increase heart rate   Sulfa Antibiotics Palpitations, Nausea Only, Shortness Of Breath and Hives    "makes heart fly, she gets flushed and passes out"   Clindamycin Rash    Tongue swelling, oral sores, and Mouth rash   Lovenox [Enoxaparin Sodium] Hives   Hydrocodone Nausea And Vomiting    Hear racing & breaks out into a cold sweat.   Meropenem Rash    Erythematous, hot, pruritic rash over arms, chest, back, abdomen, and face occurred at the end of meropenem infusion on 02/22/18    Family History  Problem Relation Age of Onset   Heart failure Father    Bipolar disorder Father    Alcohol abuse Father    Anxiety disorder Father    Depression Father    Heart disease Brother    Heart attack Brother 98       MI s/p stents placed   Anxiety disorder Sister    Depression Sister    Anxiety disorder Sister    Depression Sister    Bipolar disorder Sister    Alcohol abuse Sister    Drug abuse Sister    Heart attack Brother      Prior to Admission medications   Medication Sig Start Date End Date Taking? Authorizing Provider  acetaminophen (TYLENOL) 325 MG tablet Take 2 tablets (650 mg total) by mouth every 6 (six) hours as needed for mild pain or moderate pain (or Fever >/= 101). 01/15/20   Sheikh, Kateri Mc Latif, DO  albuterol (PROVENTIL) (2.5 MG/3ML) 0.083% nebulizer solution Take 3 mLs (2.5 mg total) by nebulization every 6  (six) hours as needed for wheezing or shortness of breath. 01/19/21   Salena Saner, MD  amLODipine (NORVASC) 10 MG tablet Take 10 mg by mouth daily. 12/10/21   [provider]  Ernestina Patches 62.5-25 MCG/ACT AEPB 1 puff daily. 12/10/21   [provider]  ascorbic acid (VITAMIN C) 500 MG tablet Take 1 tablet (500 mg total) by mouth daily. 05/05/19   Rolly Salter, MD  atorvastatin (LIPITOR) 80 MG tablet SMARTSIG:1 Tablet(s) By Mouth Every Evening 12/10/21   [provider]  Budeson-Glycopyrrol-Formoterol (BREZTRI AEROSPHERE) 160-9-4.8 MCG/ACT AERO Inhale 2 puffs into the lungs in the morning and at bedtime. 10/01/21   Salena Saner, MD  buprenorphine (BUTRANS) 10 MCG/HR PTWK Place 1 patch onto  the skin once a week for 28 days. Patient not taking: Reported on 12/27/2021 01/14/22 02/11/22  Edward Jolly, MD  buprenorphine (BUTRANS) 7.5 MCG/HR Place 1 patch onto the skin once a week for 28 days. Patient not taking: Reported on 12/27/2021 12/17/21 01/14/22  Edward Jolly, MD  busPIRone (BUSPAR) 10 MG tablet Take 1 tablet (10 mg total) by mouth 2 (two) times daily. 12/15/21   Clapacs, Jackquline Denmark, MD  calcium citrate-vitamin D 500-500 MG-UNIT chewable tablet Chew 0.5 tablets by mouth daily. 01/16/20   Marguerita Merles Latif, DO  Calcium Citrate-Vitamin D3 (GNP CALCIUM CITRATE+D MAXIMUM) 315-6.25 MG-MCG TABS Take 1 tablet by mouth daily. 12/10/21   [provider]  Cholecalciferol (VITAMIN D) 125 MCG (5000 UT) CAPS Take 1 capsule by mouth daily. 11/29/19   [provider]  ELIQUIS 5 MG TABS tablet TAKE 1 TABLET BY MOUTH TWICE DAILY 06/26/21   Salena Saner, MD  famotidine (PEPCID) 20 MG tablet Take 1 tablet (20 mg total) by mouth daily. 05/05/19   Rolly Salter, MD  FLUoxetine (PROZAC) 40 MG capsule Take 1 capsule (40 mg total) by mouth daily. 12/15/21 12/15/22  Clapacs, Jackquline Denmark, MD  folic acid (FOLVITE) 1 MG tablet Take 1 mg by mouth daily.     [provider]   furosemide (LASIX) 20 MG tablet TAKE (1) TABLET BY MOUTH DAILY AS NEEDED. Patient not taking: Reported on 12/27/2021 09/05/19   Parrett, Virgel Bouquet, NP  hydroxychloroquine (PLAQUENIL) 200 MG tablet Take 200 mg by mouth 2 (two) times daily. 12/10/21   [provider]  levothyroxine (SYNTHROID, LEVOTHROID) 88 MCG tablet Take 88 mcg by mouth daily before breakfast.    [provider]  metFORMIN (GLUCOPHAGE-XR) 500 MG 24 hr tablet Take 500 mg by mouth daily. 12/31/19   [provider]  metoCLOPramide (REGLAN) 5 MG/5ML solution Take by mouth. 12/20/21   [provider]  metoprolol tartrate (LOPRESSOR) 25 MG tablet Take 25 mg by mouth 2 (two) times daily. 10/10/20   [provider]  oxyCODONE-acetaminophen (PERCOCET/ROXICET) 5-325 MG tablet Take 1 tablet by mouth every 8 (eight) hours as needed for severe pain.    [provider]  polyethylene glycol (MIRALAX / GLYCOLAX) 17 g packet Take 17 g by mouth daily as needed for mild constipation. 05/04/19   Rolly Salter, MD  pramipexole (MIRAPEX) 0.125 MG tablet Take 0.25 mg by mouth at bedtime.     [provider]  pregabalin (LYRICA) 100 MG capsule Take 1 capsule (100 mg total) by mouth 3 (three) times daily. 09/24/21   Edward Jolly, MD  QUEtiapine (SEROQUEL) 300 MG tablet TAKE ONE TABLET BY MOUTH AT BEDTIME. 12/15/21   Clapacs, Jackquline Denmark, MD  TRADJENTA 5 MG TABS tablet Take 5 mg by mouth daily. 12/31/19   [provider]  zinc sulfate 220 (50 Zn) MG capsule Take 220 mg by mouth at bedtime.  12/31/19   [provider]  zolpidem (AMBIEN) 5 MG tablet Take 1 tablet (5 mg total) by mouth at bedtime. 01/20/21   Clapacs, Jackquline Denmark, MD    Physical Exam: Vitals:   01/04/22 0600 01/04/22 0630 01/04/22 0801 01/04/22 0920  BP: 94/76 99/73 104/62 (!) 79/47  Pulse: 68 63 66 67  Resp: Temp:   98.1 F (36.7 C)   TempSrc:   Oral   SpO2: 99% 100% 94% 93%    Constitutional: NAD, calm,  comfortable Vitals:   01/04/22 0600 01/04/22  0630 01/04/22 0801 01/04/22 0920  BP: 94/76 99/73 104/62 (!) 79/47  Pulse: 68 63 66 67  Resp: Temp:   98.1 F (36.7 C)   TempSrc:   Oral   SpO2: 99% 100% 94% 93%   Eyes: PERRL, lids and conjunctivae normal ENMT: Mucous membranes are dry. Posterior pharynx clear of any exudate or lesions.Normal dentition.  Neck: normal, supple, no masses, no thyromegaly Respiratory: clear to auscultation bilaterally, no wheezing, no crackles. Normal respiratory effort. No accessory muscle use.  Cardiovascular: Regular rate and rhythm, no murmurs / rubs / gallops. No extremity edema. 2+ pedal pulses. No carotid bruits.  Abdomen: mild tenderness on epigastric area, no rebound no guarding, no masses palpated. No hepatosplenomegaly. Bowel sounds positive.  Musculoskeletal: no clubbing / cyanosis. No joint deformity upper and lower extremities. Good ROM, no contractures. Normal muscle tone.  Skin: no rashes, lesions, ulcers. No induration Neurologic: CN 2-12 grossly intact. Sensation intact, DTR normal. Strength 5/5 in all 4.  Psychiatric: Normal judgment and insight. Alert and oriented x 3. Normal mood.    Labs on Admission: I have personally reviewed following labs and imaging studies  CBC: Recent Labs  Lab 01/02/22 1700 01/04/22 0231  WBC 6.5 16.2*  NEUTROABS  --  12.6*  HGB 9.6* 10.5*  HCT 31.8* 36.4  MCV 71.3* 73.8*  PLT 242 301   Basic Metabolic Panel: Recent Labs  Lab 01/02/22 1700 01/04/22 0231  NA 140 139  K 3.5 3.3*  CL 108 106  CO2 24 20*  GLUCOSE 138* 177*  BUN 16 18  CREATININE 0.95 1.89*  CALCIUM 8.9 8.8*   GFR: Estimated Creatinine Clearance: 46.7 mL/min (A) (by C-G formula based on SCr of 1.89 mg/dL (H)). Liver Function Tests: Recent Labs  Lab 01/02/22 1700 01/04/22 0231  AST 14* 19  ALT 9 10  ALKPHOS 76 79  BILITOT 0.6 0.5  PROT 7.4 7.7  ALBUMIN 3.4* 3.5   Recent Labs  Lab 01/02/22 1700  01/04/22 0231  LIPASE 38 39   No results for input(s): "AMMONIA" in the last 168 hours. Coagulation Profile: No results for input(s): "INR", "PROTIME" in the last 168 hours. Cardiac Enzymes: No results for input(s): "CKTOTAL", "CKMB", "CKMBINDEX", "TROPONINI" in the last 168 hours. BNP (last 3 results) No results for input(s): "PROBNP" in the last 8760 hours. HbA1C: No results for input(s): "HGBA1C" in the last 72 hours. CBG: Recent Labs  Lab 12/28/21 1703 12/28/21 2324 12/29/21 0855 12/29/21 1216 12/29/21 1548  GLUCAP 142* 102* 131* 163* 136*   Lipid Profile: No results for input(s): "CHOL", "HDL", "LDLCALC", "TRIG", "CHOLHDL", "LDLDIRECT" in the last 72 hours. Thyroid Function Tests: No results for input(s): "TSH", "T4TOTAL", "FREET4", "T3FREE", "THYROIDAB" in the last 72 hours. Anemia Panel: No results for input(s): "VITAMINB12", "FOLATE", "FERRITIN", "TIBC", "IRON", "RETICCTPCT" in the last 72 hours. Urine analysis:    Component Value Date/Time   COLORURINE YELLOW (A) 01/04/2022 0405   APPEARANCEUR HAZY (A) 01/04/2022 0405   APPEARANCEUR Clear 11/30/2013 1010   LABSPEC 1.015 01/04/2022 0405   LABSPEC 1.015 11/30/2013 1010   PHURINE 6.0 01/04/2022 0405   GLUCOSEU NEGATIVE 01/04/2022 0405   GLUCOSEU >=500 11/30/2013 1010   HGBUR NEGATIVE 01/04/2022 0405   BILIRUBINUR NEGATIVE 01/04/2022 0405   BILIRUBINUR Negative 11/30/2013 1010   KETONESUR NEGATIVE 01/04/2022 0405   PROTEINUR 30 (A) 01/04/2022 0405   UROBILINOGEN 0.2 03/14/2007 1000   NITRITE NEGATIVE 01/04/2022 0405   LEUKOCYTESUR NEGATIVE 01/04/2022 0405  LEUKOCYTESUR Negative 11/30/2013 1010    Radiological Exams on Admission: CT ABDOMEN PELVIS WO CONTRAST  Result Date: 01/04/2022 CLINICAL DATA:  61 year old female with history of acute onset of nonlocalized abdominal pain. EXAM: CT ABDOMEN AND PELVIS WITHOUT CONTRAST TECHNIQUE: Multidetector CT imaging of the abdomen and pelvis was performed following  the standard protocol without IV contrast. RADIATION DOSE REDUCTION: This exam was performed according to the departmental dose-optimization program which includes automated exposure control, adjustment of the mA and/or kV according to patient size and/or use of iterative reconstruction technique. COMPARISON:  CT of the abdomen and pelvis 12/27/2021. FINDINGS: Lower chest: Calcifications of the mitral annulus. Hepatobiliary: No definite suspicious cystic or solid hepatic lesions are confidently identified on today's noncontrast CT examination. Status post cholecystectomy. Pancreas: No definite pancreatic mass or peripancreatic fluid collections or inflammatory changes are noted on today's noncontrast CT examination. Spleen: Unremarkable. Adrenals/Urinary Tract: There are no abnormal calcifications within the collecting system of either kidney, along the course of either ureter, or within the lumen of the urinary bladder. No hydroureteronephrosis or perinephric stranding to suggest urinary tract obstruction at this time. The unenhanced appearance of the kidneys is unremarkable bilaterally. Urinary bladder is completely decompressed around an indwelling Foley balloon catheter, but is otherwise unremarkable in appearance. Bilateral adrenal glands are normal in appearance. Stomach/Bowel: Unenhanced appearance of the stomach is normal. There is no pathologic dilatation of small bowel or colon. Normal appendix. Vascular/Lymphatic: Atherosclerotic calcifications are noted in the abdominal aorta. No lymphadenopathy noted in the abdomen or pelvis. Reproductive: Status post hysterectomy. Ovaries are not confidently identified may be surgically absent or atrophic. Other: Small right paramidline ventral hernia in the supraumbilical region containing a small amount of omental fat and fluid, similar to the prior study. No significant volume of ascites. No pneumoperitoneum. Musculoskeletal: There are no aggressive appearing lytic  or blastic lesions noted in the visualized portions of the skeleton. IMPRESSION: 1. No acute findings are noted in the abdomen or pelvis to account for the patient's symptoms. 2. Aortic atherosclerosis. 3. Additional incidental findings, as above. Electronically Signed   By: Trudie Reed M.D.   On: 01/04/2022 04:57   CT HEAD WO CONTRAST ( )  Result Date: 01/04/2022 CLINICAL DATA:  Recent syncopal episode and fall with headaches and neck pain, initial encounter EXAM: CT HEAD WITHOUT CONTRAST CT CERVICAL SPINE WITHOUT CONTRAST TECHNIQUE: Multidetector CT imaging of the head and cervical spine was performed following the standard protocol without intravenous contrast. Multiplanar CT image reconstructions of the cervical spine were also generated. RADIATION DOSE REDUCTION: This exam was performed according to the departmental dose-optimization program which includes automated exposure control, adjustment of the mA and/or kV according to patient size and/or use of iterative reconstruction technique. COMPARISON:  12/27/2021 FINDINGS: CT HEAD FINDINGS Brain: No evidence of acute infarction, hemorrhage, hydrocephalus, extra-axial collection or mass lesion/mass effect. Mild atrophic changes are noted. Vascular: No hyperdense vessel or unexpected calcification. Skull: Normal. Negative for fracture or focal lesion. Sinuses/Orbits: No acute finding. Other: None. CT CERVICAL SPINE FINDINGS Alignment: Reversal of the normal cervical lordosis is noted likely related muscular spasm. Skull base and vertebrae: 7 cervical segments are well visualized. Vertebral body height is well maintained. No acute fracture or acute facet abnormality is noted. Mild facet hypertrophic changes are noted. The odontoid is within normal limits. Soft tissues and spinal canal: No prevertebral fluid or swelling. No visible canal hematoma Upper chest: Visualized lung apices are within normal limits. Other: None IMPRESSION: CT of the head:  Chronic  atrophic changes without acute abnormality. CT of the cervical spine: Multilevel degenerative change without acute abnormality. Electronically Signed   By: Alcide Clever M.D.   On: 01/04/2022 03:25   CT Cervical Spine Wo Contrast  Result Date: 01/04/2022 CLINICAL DATA:  Recent syncopal episode and fall with headaches and neck pain, initial encounter EXAM: CT HEAD WITHOUT CONTRAST CT CERVICAL SPINE WITHOUT CONTRAST TECHNIQUE: Multidetector CT imaging of the head and cervical spine was performed following the standard protocol without intravenous contrast. Multiplanar CT image reconstructions of the cervical spine were also generated. RADIATION DOSE REDUCTION: This exam was performed according to the departmental dose-optimization program which includes automated exposure control, adjustment of the mA and/or kV according to patient size and/or use of iterative reconstruction technique. COMPARISON:  12/27/2021 FINDINGS: CT HEAD FINDINGS Brain: No evidence of acute infarction, hemorrhage, hydrocephalus, extra-axial collection or mass lesion/mass effect. Mild atrophic changes are noted. Vascular: No hyperdense vessel or unexpected calcification. Skull: Normal. Negative for fracture or focal lesion. Sinuses/Orbits: No acute finding. Other: None. CT CERVICAL SPINE FINDINGS Alignment: Reversal of the normal cervical lordosis is noted likely related muscular spasm. Skull base and vertebrae: 7 cervical segments are well visualized. Vertebral body height is well maintained. No acute fracture or acute facet abnormality is noted. Mild facet hypertrophic changes are noted. The odontoid is within normal limits. Soft tissues and spinal canal: No prevertebral fluid or swelling. No visible canal hematoma Upper chest: Visualized lung apices are within normal limits. Other: None IMPRESSION: CT of the head: Chronic atrophic changes without acute abnormality. CT of the cervical spine: Multilevel degenerative change without acute  abnormality. Electronically Signed   By: Alcide Clever M.D.   On: 01/04/2022 03:25   DG Abdomen 1 View  Result Date: 01/02/2022 CLINICAL DATA:  Chronic abdominal pain and vomiting. EXAM: ABDOMEN - 1 VIEW COMPARISON:  CT abdomen pelvis dated December 27, 2021. FINDINGS: The bowel gas pattern is normal. No radio-opaque calculi or other significant radiographic abnormality are seen. Prior cholecystectomy. Bladder stimulator again noted. IMPRESSION: 1. Negative. Electronically Signed   By: Obie Dredge M.D.   On: 01/02/2022 15:08    EKG: Independently reviewed.  Sinus rhythm, reported as junction but clearly there are P waves on lead II and V3, borderline prolonged QTc, no acute ST changes.  Assessment/Plan Principal Problem:   Near syncope Active Problems:   AKI (acute kidney injury) (HCC)  (please populate well all problems here in Problem List. (For example, if patient is on BP meds at home and you resume or decide to hold them, it is a problem that needs to be her. Same for CAD, COPD, HLD and so on)  SIRS with hypotension -Secondary to continuous GI loss from likely acute gastroenteritis intractable nauseous vomiting and diarrhea.  Patient reported that she contacted her grand daughter who had COVID infection about 10 days ago, but she denies any RUL symptoms no shortness of breath or cough.  Check COVID.  Stool culture pending in the ED. -Given she has a leukocytosis but she does not have a fever and we will hold off antibiotics unless GI pathogen panel showed otherwise. -Continue aggressive fluid resuscitation -Other Ddx, UA showed WBC 11-20, however, patient has completed 5 days course of Cipro to treat a sensitive Klebsiella UTI last week.  And patient complaining about no urinary symptoms, hold off UTI treatment. -Review of patient's history appears that the patient has a long history of borderline blood pressure, last admission her blood pressure  MAP ranging from 59-65.  Acute  gastroenteritis -With epigastric abdominal pain, rule out COVID infection as above.  GI pathogen pending -Other DDx, normal LFTs, CT abdomen and RUQ ultrasound positive for gallstones but less likely to cause her symptoms.  Near syncope -Vasovagal, secondary to severe volume depletion -Aggressive IV resuscitation -check orthostatic vital signs -Given the clear etiology, will not order echocardiogram this time.  AKI -Secondary to volume contraction and dehydration -IV hydration as above and recheck BMP tomorrow  Hypokalemia -P.o. replacement, check magnesium level  HTN -Hold off home BP meds, as needed Lopressor  Chronic HFpEF -Volume depleted, hold off BP and diuresis medication.  PAF -In sinus rhythm, change metoprolol to as needed -Continue Eliquis  IIDM -Discontinue metformin, start sliding scale  COPD -Stable, no acute concerns  Diabetic neuropathy -Continue Lyrica  Morbid obesity -BMI= 51, recommend outpatient bariatric evaluation.  Borderline prolonged Qtc -On Zofran and SSRI, repeat EKG tomorrow  DVT prophylaxis: Eliquis Code Status: Full code Family Communication: None at bedside Disposition Plan: Expect less than 2 midnight hospital stay Consults called: None Admission status: Telemetry observation   Emeline General MD [facility] Pager [phone]  01/04/2022, 9:33 AM

## 2022-01-04 NOTE — ED Notes (Signed)
Patient transported to CT 

## 2022-01-05 DIAGNOSIS — N179 Acute kidney failure, unspecified: Secondary | ICD-10-CM

## 2022-01-05 DIAGNOSIS — A084 Viral intestinal infection, unspecified: Secondary | ICD-10-CM | POA: Diagnosis present

## 2022-01-05 DIAGNOSIS — R55 Syncope and collapse: Secondary | ICD-10-CM | POA: Diagnosis not present

## 2022-01-05 DIAGNOSIS — E872 Acidosis, unspecified: Secondary | ICD-10-CM | POA: Diagnosis not present

## 2022-01-05 LAB — BASIC METABOLIC PANEL
Anion gap: 7 (ref 5–15)
BUN: 19 mg/dL (ref 6–20)
CO2: 21 mmol/L — ABNORMAL LOW (ref 22–32)
Calcium: 7.6 mg/dL — ABNORMAL LOW (ref 8.9–10.3)
Chloride: 115 mmol/L — ABNORMAL HIGH (ref 98–111)
Creatinine, Ser: 1.27 mg/dL — ABNORMAL HIGH (ref 0.44–1.00)
GFR, Estimated: 48 mL/min — ABNORMAL LOW (ref >60)
Glucose, Bld: 108 mg/dL — ABNORMAL HIGH (ref 70–99)
Potassium: 3.8 mmol/L (ref 3.5–5.1)
Sodium: 143 mmol/L (ref 135–145)

## 2022-01-05 LAB — URINE CULTURE: Culture: NO GROWTH

## 2022-01-05 LAB — CBC
HCT: 32 % — ABNORMAL LOW (ref 36.0–46.0)
Hemoglobin: 9 g/dL — ABNORMAL LOW (ref 12.0–15.0)
MCH: 20.9 pg — ABNORMAL LOW (ref 26.0–34.0)
MCHC: 28.1 g/dL — ABNORMAL LOW (ref 30.0–36.0)
MCV: 74.4 fL — ABNORMAL LOW (ref 80.0–100.0)
Platelets: 196 10*3/uL (ref 150–400)
RBC: 4.3 MIL/uL (ref 3.87–5.11)
RDW: 17.4 % — ABNORMAL HIGH (ref 11.5–15.5)
WBC: 7.5 10*3/uL (ref 4.0–10.5)
nRBC: 0 % (ref 0.0–0.2)

## 2022-01-05 LAB — GLUCOSE, CAPILLARY
Glucose-Capillary: 135 mg/dL — ABNORMAL HIGH (ref 70–99)
Glucose-Capillary: 114 mg/dL — ABNORMAL HIGH (ref 70–99)
Glucose-Capillary: 127 mg/dL — ABNORMAL HIGH (ref 70–99)
Glucose-Capillary: 137 mg/dL — ABNORMAL HIGH (ref 70–99)

## 2022-01-05 LAB — PROCALCITONIN: Procalcitonin: <0.1 ng/mL

## 2022-01-05 MED ORDER — POTASSIUM CHLORIDE IN NACL 40-0.9 MEQ/L-% IV SOLN
INTRAVENOUS | Status: DC
  Filled 2022-01-05 (×3): qty 1000

## 2022-01-05 MED ORDER — PRAMIPEXOLE 0.5 MG TABLET
ORAL_TABLET | Freq: Every evening | ORAL | 1 refills | 90.00000 days | Status: CP
Start: 2022-01-05 — End: 2023-01-05

## 2022-01-05 NOTE — Assessment & Plan Note (Signed)
Secondary to hypotension from volume depletion from gastroenteritis.  Resolved.  Treated with IV fluids.

## 2022-01-05 NOTE — Hospital Course (Addendum)
61 year old female with past medical history of morbid obesity, hypertension, diastolic heart failure, paroxysmal atrial fibrillation on Eliquis, bipolar disorder, rheumatoid arthritis, diabetes mellitus and COPD who presented to the emergency room on 9/18 with tach, vomiting, abdominal pain and diarrhea x3 days.  Patient started 3 days prior and patient actually came to the emergency room on 9/16 for dizziness and was given IV fluids and sent home.  In the emergency room, noted to have low blood pressure.  Lab work noteworthy for acute kidney injury with a creatinine of 1.89, hypomagnesemia, leukocytosis with a white count of 16 and a lactic acid level 4.8.  COVID test negative.  CT of head, chest, abdomen and pelvis unremarkable.  Following admission, patient had rapid improvement in sooner than expected time.  Had no further diarrhea and diet was able to be advanced which she tolerated.  Creatinine by following morning down to 1.27.  By evening of 9/19, patient able to tolerate p.o. and was able to ambulate and felt to be stable for discharge.

## 2022-01-05 NOTE — Progress Notes (Signed)
Mobility Specialist - Progress Note   01/05/22 1022  Mobility  Activity Stood at bedside;Dangled on edge of bed  Level of Assistance +2 (takes two people)  Information systems manager Ambulated (ft) 0 ft  Activity Response Tolerated well  $Mobility charge 1 Mobility   Pt semi-supine in bed on 3L upon arrival. Pt able to scoot to EOB and STS MinA + 2 for safety.  PT able to stand upright for over 2 minutes with no LOB. Pt returns to bed with needs in reach and bed alarm on.   Orthostatics  Laying  Heart Rate = 82 Blood Pressure = 112/58  Sitting EOB Heart Rate = 92 Blood Pressure = 112/58  Standing  Heart Rate = 106 Blood Pressure = 102/58  Standing for 2 minutes  Heart Rate = 94 Blood Pressure = 96/53   Gretchen Short  Mobility Specialist  01/05/22 10:30 AM

## 2022-01-05 NOTE — TOC Initial Note (Signed)
Transition of Care [facility]) - Initial/Assessment Note    Patient Details  Name: [name] [name] MRN: [id number] Date of Birth: 1960/06/02  Transition of Care [facility]) CM/SW Contact:    Candie Chroman, LCSW Phone Number: 01/05/2022, 11:14 AM  Clinical Narrative: Readmission prevention screen complete. CSW met with patient. No supports at bedside. CSW introduced role and explained that discharge planning would be discussed. PCP is Verita Lamb, NP at [facility] in [place]. Patient or mom drives to appointments. [facility] [place]. For long-term medications she uses [facility] in [place]. No issues obtaining medications. No home health prior to admission. She has a wheelchair, rollator, RW, and SPC at home. Patient is on 3 L chronic oxygen through [place]. No further concerns. CSW encouraged patient to contact CSW as needed. CSW will continue to follow patient for support and facilitate return home once stable. Her mother will transport her home at discharge.                 Expected Discharge Plan: Home/Self Care Barriers to Discharge: Continued Medical Work up   Patient Goals and CMS Choice Patient states their goals for this hospitalization and ongoing recovery are:: "To get well." CMS Medicare.gov Compare Post Acute Care list provided to::  (N/A) Choice offered to / list presented to : NA  Expected Discharge Plan and Services Expected Discharge Plan: Home/Self Care     Post Acute Care Choice: NA Living arrangements for the past 2 months: Single Family Home                                      Prior Living Arrangements/Services Living arrangements for the past 2 months: Single Family Home   Patient language and need for interpreter reviewed:: Yes Do you feel safe going back to the place where you live?: Yes          Current home services: DME Criminal Activity/Legal Involvement Pertinent to Current  Situation/Hospitalization: No - Comment as needed  Activities of Daily Living      Permission Sought/Granted                  Emotional Assessment Appearance:: Appears stated age Attitude/Demeanor/Rapport: Engaged, Gracious Affect (typically observed): Accepting, Appropriate, Calm, Pleasant Orientation: : Oriented to Self, Oriented to Place, Oriented to  Time, Oriented to Situation Alcohol / Substance Use: Not Applicable Psych Involvement: No (comment)  Admission diagnosis:  AKI (acute kidney injury) ([place]) [N17.9] Near syncope [R55] Hypotension [I95.9] Sepsis ([place]) [A41.9] Hypotension, unspecified hypotension type [I95.9] Syncope, unspecified syncope type [R55] Patient Active Problem List   Diagnosis Date Noted   Sepsis ([place]) 01/04/2022   Near syncope 01/04/2022   Hypotension 01/04/2022   UTI (urinary tract infection) 12/27/2021   Morbid obesity ([place])    Depression with anxiety    PAF (paroxysmal atrial fibrillation) ([place])    Paroxysmal A-fib ([place])    Morbid obesity with BMI of 50.0-59.9, adult ([place]) 07/08/2019   Type 2 diabetes mellitus without complication, without long-term current use of insulin ([place]) 04/06/2019   CKD (chronic kidney disease), stage III ([place]) 04/06/2019   HLD (hyperlipidemia) 04/06/2019   AKI (acute kidney injury) ([place]) 02/12/2019   Chronic pain syndrome 03/31/2016   Rheumatoid arthritis ([place]) 12/25/2015   Osteoarthritis, multiple sites 12/24/2015   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[employer]) 12/23/2015   Long term  current use of opiate analgesic 12/23/2015   Long term prescription opiate use 12/23/2015   Diabetic peripheral neuropathy ([place]) 12/23/2015   Chronic diastolic CHF (congestive heart failure) ([place]) 07/03/2014   COPD (chronic obstructive pulmonary disease) ([place]) 01/03/2014   Bipolar disorder, unspecified ([place]) 01/03/2014   Borderline personality disorder ([place]) 01/06/2012   Hypothyroidism 06/28/2010   Rheumatoid  arthritis involving multiple sites with positive rheumatoid factor ([place]) 06/28/2010   Essential (primary) hypertension 01/27/2005   Major depressive disorder, recurrent episode, moderate ([place]) 06/03/2004   PCP:  Verita Lamb, NP Pharmacy:   [id number] [place] [id number]: [phone]: [phone]    Social Determinants of Health (SDOH) Interventions    Readmission Risk Interventions    01/05/2022   11:09 AM 07/17/2019   12:16 PM 07/09/2019    2:43 PM  Readmission Risk Prevention Plan  Transportation Screening Complete Complete Complete  PCP or Specialist Appt within 3-5 Days Complete    Social Work Consult for [place] Complete    Palliative Care Screening Not Applicable    Medication Review (Press photographer Complete Complete Complete  PCP or Specialist appointment within 3-5 days of discharge  Complete Complete  HRI or Home Care Consult  Complete Complete  Palliative Care Screening  Not [place] Not Applicable

## 2022-01-05 NOTE — Assessment & Plan Note (Signed)
With normal procalcitonin and quick resolution of symptoms looks to be more of a viral gastroenteritis.  Resolved.  White blood cell count normalized, and likely elevated may be due to stress margination.  Sepsis ruled out.

## 2022-01-05 NOTE — Assessment & Plan Note (Signed)
Improved with IV fluids.  Creatinine down to 1.27 by morning of 9/19 she was continued on fluids all day.

## 2022-01-05 NOTE — Assessment & Plan Note (Signed)
-   Continue home medications 

## 2022-01-05 NOTE — Assessment & Plan Note (Signed)
Stable, no hypoxia during this hospitalization.

## 2022-01-05 NOTE — Assessment & Plan Note (Signed)
Felt to be secondary to intravascular volume depletion from nausea, vomiting, diarrhea from viral gastroenteritis.  Sepsis ruled out.  Resolved with IV fluids.

## 2022-01-05 NOTE — Assessment & Plan Note (Signed)
Meets criteria with BMI greater than 40 

## 2022-01-05 NOTE — Discharge Summary (Addendum)
Physician Discharge Summary   Patient: [name] [name] MRN: [id number] DOB: Jan 26, 1961  Admit date:     01/04/2022  Discharge date: 01/05/22  Discharge Physician: Hollice Espy   PCP: Etheleen Nicks, NP   Recommendations at discharge:   Patient will follow-up with her PCP in the next month Upon discharge, patient looks to be unsteady on her feet.  Nursing discussed the situation with attending physician.  Patient insisted on going home, but was amenable to having home health PT and OT.  Orders put in a case manager will follow-up with her in the morning.  Discharge Diagnoses: Active Problems:   Lactic acidosis   COPD (chronic obstructive pulmonary disease) (HCC)   PAF (paroxysmal atrial fibrillation) (HCC)   Bipolar disorder, unspecified (HCC)   Rheumatoid arthritis (HCC)   Morbid obesity (HCC)   Controlled type 2 diabetes mellitus without complication, without long-term current use of insulin (HCC)  Principal Problem (Resolved):   Near syncope Resolved Problems:   AKI (acute kidney injury) (HCC)   Hypotension   Viral gastroenteritis  Hospital Course: 61 year old female with past medical history of morbid obesity, hypertension, diastolic heart failure, paroxysmal atrial fibrillation on Eliquis, bipolar disorder, rheumatoid arthritis, diabetes mellitus and COPD who presented to the emergency room on 9/18 with tach, vomiting, abdominal pain and diarrhea x3 days.  Patient started 3 days prior and patient actually came to the emergency room on 9/16 for dizziness and was given IV fluids and sent home.  In the emergency room, noted to have low blood pressure.  Lab work noteworthy for acute kidney injury with a creatinine of 1.89, hypomagnesemia, leukocytosis with a white count of 16 and a lactic acid level 4.8.  COVID test negative.  CT of head, chest, abdomen and pelvis unremarkable.  Following admission, patient had rapid improvement in sooner than expected time.  Had no further  diarrhea and diet was able to be advanced which she tolerated.  Creatinine by following morning down to 1.27.  By evening of 9/19, patient able to tolerate p.o. and was able to ambulate and felt to be stable for discharge.  Assessment and Plan: * Near syncope-resolved as of 01/05/2022 Secondary to hypotension from volume depletion from gastroenteritis.  Resolved.  Treated with IV fluids.  AKI (acute kidney injury) (HCC)-resolved as of 01/05/2022 Improved with IV fluids.  Creatinine down to 1.27 by morning of 9/19 she was continued on fluids all day.  Hypotension-resolved as of 01/05/2022 Secondary to volume loss from gastroenteritis.  Treated with IV fluids and by time of discharge, systolic blood pressure at 149.  Patient not orthostatic.  Lactic acidosis Felt to be secondary to intravascular volume depletion from nausea, vomiting, diarrhea from viral gastroenteritis.  Sepsis ruled out.  Resolved with IV fluids.  Viral gastroenteritis-resolved as of 01/05/2022 With normal procalcitonin and quick resolution of symptoms looks to be more of a viral gastroenteritis.  Resolved.  White blood cell count normalized, and likely elevated may be due to stress margination.  Sepsis ruled out.  COPD (chronic obstructive pulmonary disease) (HCC) Stable, no hypoxia during this hospitalization.  PAF (paroxysmal atrial fibrillation) (HCC) Remained in sinus rhythm during this hospitalization.  Continued on Eliquis.  Bipolar disorder, unspecified (HCC) Continue home medications.  Morbid obesity (HCC) Meets criteria with BMI greater than 40  Controlled type 2 diabetes mellitus without complication, without long-term current use of insulin (HCC) Stable, although noting that patient on metformin.  Her elevated lactic acid level up more attributed to intravascular volume  depletion, however if she has recurrent episodes of lactic acidosis, need to consider discontinuing metformin for a different agent.         Pain control - [employer] Controlled Substance Reporting System database was reviewed. and patient was instructed, not to drive, operate heavy machinery, perform activities at heights, swimming or participation in water activities or provide baby-sitting services while on Pain, Sleep and Anxiety Medications; until their outpatient Physician has advised to do so again. Also recommended to not to take more than prescribed Pain, Sleep and Anxiety Medications.  Consultants: None Procedures performed: None Disposition: Home Diet recommendation:  Discharge Diet Orders (From admission, onward)     Start     Ordered   01/05/22 0000  Diet - low sodium heart healthy        01/05/22 1755           Cardiac and Carb modified diet DISCHARGE MEDICATION: Allergies as of 01/05/2022       Reactions   Cephalexin Hives   Codeine Palpitations, Nausea Only, Nausea And Vomiting, Rash, Shortness Of Breath   "makes heart fly, she gets flushed and passes out"   Doxycycline Rash   Propoxyphene Rash, Shortness Of Breath   Increase heart rate   Sulfa Antibiotics Palpitations, Nausea Only, Shortness Of Breath, Hives   "makes heart fly, she gets flushed and passes out"   Clindamycin Rash   Tongue swelling, oral sores, and Mouth rash   Lovenox [enoxaparin Sodium] Hives   Hydrocodone Nausea And Vomiting   Hear racing & breaks out into a cold sweat.   Meropenem Rash   Erythematous, hot, pruritic rash over arms, chest, back, abdomen, and face occurred at the end of meropenem infusion on 02/22/18        Medication List     STOP taking these medications    albuterol (2.5 MG/3ML) 0.083% nebulizer solution Commonly known as: PROVENTIL   Anoro Ellipta 62.5-25 MCG/ACT Aepb Generic drug: umeclidinium-vilanterol       TAKE these medications    acetaminophen 325 MG tablet Commonly known as: TYLENOL Take 2 tablets (650 mg total) by mouth every 6 (six) hours as needed for mild pain or moderate pain  (or Fever >/= 101).   amLODipine 10 MG tablet Commonly known as: NORVASC Take 10 mg by mouth daily.   ascorbic acid 500 MG tablet Commonly known as: VITAMIN C Take 1 tablet (500 mg total) by mouth daily.   atorvastatin 80 MG tablet Commonly known as: LIPITOR Take 80 mg by mouth daily.   Breztri Aerosphere 160-9-4.8 MCG/ACT Aero Generic drug: Budeson-Glycopyrrol-Formoterol Inhale 2 puffs into the lungs in the morning and at bedtime.   busPIRone 10 MG tablet Commonly known as: BUSPAR Take 1 tablet (10 mg total) by mouth 2 (two) times daily.   Eliquis 5 MG Tabs tablet Generic drug: apixaban TAKE 1 TABLET BY MOUTH TWICE DAILY What changed: how much to take   famotidine 20 MG tablet Commonly known as: PEPCID Take 1 tablet (20 mg total) by mouth daily.   FLUoxetine 40 MG capsule Commonly known as: PROzac Take 1 capsule (40 mg total) by mouth daily.   folic acid 1 MG tablet Commonly known as: FOLVITE Take 1 mg by mouth daily.   GNP Calcium Citrate+D Maximum 315-6.25 MG-MCG Tabs Generic drug: Calcium Citrate-Vitamin D3 Take 1 tablet by mouth daily.   hydroxychloroquine 200 MG tablet Commonly known as: PLAQUENIL Take 200 mg by mouth 2 (two) times daily.   levothyroxine 88  MCG tablet Commonly known as: SYNTHROID Take 88 mcg by mouth daily before breakfast.   metFORMIN 500 MG 24 hr tablet Commonly known as: GLUCOPHAGE-XR Take 500 mg by mouth daily.   metoCLOPramide 5 MG/5ML solution Commonly known as: REGLAN Take 5 mg by mouth 4 (four) times daily -  before meals and at bedtime.   metoprolol tartrate 25 MG tablet Commonly known as: LOPRESSOR Take 25 mg by mouth 2 (two) times daily.   oxyCODONE-acetaminophen 5-325 MG tablet Commonly known as: PERCOCET/ROXICET Take 1 tablet by mouth every 8 (eight) hours as needed for severe pain.   pramipexole 0.125 MG tablet Commonly known as: MIRAPEX Take 0.25 mg by mouth at bedtime.   pregabalin 100 MG capsule Commonly  known as: Lyrica Take 1 capsule (100 mg total) by mouth 3 (three) times daily.   QUEtiapine 300 MG tablet Commonly known as: SEROQUEL TAKE ONE TABLET BY MOUTH AT BEDTIME.   Tradjenta 5 MG Tabs tablet Generic drug: linagliptin Take 5 mg by mouth daily.   Vitamin D 125 MCG (5000 UT) Caps Take 1 capsule by mouth daily.   zinc sulfate 220 (50 Zn) MG capsule Take 220 mg by mouth at bedtime.   zolpidem 5 MG tablet Commonly known as: AMBIEN Take 1 tablet (5 mg total) by mouth at bedtime.        Discharge Exam: There were no vitals filed for this visit. General: Alert and oriented x3, no acute distress Cardiovascular: Regular rate and rhythm, S1-S2 Lungs: Decreased breath sounds throughout secondary to body habitus  Condition at discharge: good  The results of significant diagnostics from this hospitalization (including imaging, microbiology, ancillary and laboratory) are listed below for reference.   Imaging Studies: CT ABDOMEN PELVIS WO CONTRAST  Result Date: 01/04/2022 CLINICAL DATA:  61 year old female with history of acute onset of nonlocalized abdominal pain. EXAM: CT ABDOMEN AND PELVIS WITHOUT CONTRAST TECHNIQUE: Multidetector CT imaging of the abdomen and pelvis was performed following the standard protocol without IV contrast. RADIATION DOSE REDUCTION: This exam was performed according to the departmental dose-optimization program which includes automated exposure control, adjustment of the mA and/or kV according to patient size and/or use of iterative reconstruction technique. COMPARISON:  CT of the abdomen and pelvis 12/27/2021. FINDINGS: Lower chest: Calcifications of the mitral annulus. Hepatobiliary: No definite suspicious cystic or solid hepatic lesions are confidently identified on today's noncontrast CT examination. Status post cholecystectomy. Pancreas: No definite pancreatic mass or peripancreatic fluid collections or inflammatory changes are noted on today's  noncontrast CT examination. Spleen: Unremarkable. Adrenals/Urinary Tract: There are no abnormal calcifications within the collecting system of either kidney, along the course of either ureter, or within the lumen of the urinary bladder. No hydroureteronephrosis or perinephric stranding to suggest urinary tract obstruction at this time. The unenhanced appearance of the kidneys is unremarkable bilaterally. Urinary bladder is completely decompressed around an indwelling Foley balloon catheter, but is otherwise unremarkable in appearance. Bilateral adrenal glands are normal in appearance. Stomach/Bowel: Unenhanced appearance of the stomach is normal. There is no pathologic dilatation of small bowel or colon. Normal appendix. Vascular/Lymphatic: Atherosclerotic calcifications are noted in the abdominal aorta. No lymphadenopathy noted in the abdomen or pelvis. Reproductive: Status post hysterectomy. Ovaries are not confidently identified may be surgically absent or atrophic. Other: Small right paramidline ventral hernia in the supraumbilical region containing a small amount of omental fat and fluid, similar to the prior study. No significant volume of ascites. No pneumoperitoneum. Musculoskeletal: There are no aggressive appearing lytic  or blastic lesions noted in the visualized portions of the skeleton. IMPRESSION: 1. No acute findings are noted in the abdomen or pelvis to account for the patient's symptoms. 2. Aortic atherosclerosis. 3. Additional incidental findings, as above. Electronically Signed   By: Trudie Reed M.D.   On: 01/04/2022 04:57   CT HEAD WO CONTRAST ( )  Result Date: 01/04/2022 CLINICAL DATA:  Recent syncopal episode and fall with headaches and neck pain, initial encounter EXAM: CT HEAD WITHOUT CONTRAST CT CERVICAL SPINE WITHOUT CONTRAST TECHNIQUE: Multidetector CT imaging of the head and cervical spine was performed following the standard protocol without intravenous contrast. Multiplanar CT  image reconstructions of the cervical spine were also generated. RADIATION DOSE REDUCTION: This exam was performed according to the departmental dose-optimization program which includes automated exposure control, adjustment of the mA and/or kV according to patient size and/or use of iterative reconstruction technique. COMPARISON:  12/27/2021 FINDINGS: CT HEAD FINDINGS Brain: No evidence of acute infarction, hemorrhage, hydrocephalus, extra-axial collection or mass lesion/mass effect. Mild atrophic changes are noted. Vascular: No hyperdense vessel or unexpected calcification. Skull: Normal. Negative for fracture or focal lesion. Sinuses/Orbits: No acute finding. Other: None. CT CERVICAL SPINE FINDINGS Alignment: Reversal of the normal cervical lordosis is noted likely related muscular spasm. Skull base and vertebrae: 7 cervical segments are well visualized. Vertebral body height is well maintained. No acute fracture or acute facet abnormality is noted. Mild facet hypertrophic changes are noted. The odontoid is within normal limits. Soft tissues and spinal canal: No prevertebral fluid or swelling. No visible canal hematoma Upper chest: Visualized lung apices are within normal limits. Other: None IMPRESSION: CT of the head: Chronic atrophic changes without acute abnormality. CT of the cervical spine: Multilevel degenerative change without acute abnormality. Electronically Signed   By: Alcide Clever M.D.   On: 01/04/2022 03:25   CT Cervical Spine Wo Contrast  Result Date: 01/04/2022 CLINICAL DATA:  Recent syncopal episode and fall with headaches and neck pain, initial encounter EXAM: CT HEAD WITHOUT CONTRAST CT CERVICAL SPINE WITHOUT CONTRAST TECHNIQUE: Multidetector CT imaging of the head and cervical spine was performed following the standard protocol without intravenous contrast. Multiplanar CT image reconstructions of the cervical spine were also generated. RADIATION DOSE REDUCTION: This exam was performed  according to the departmental dose-optimization program which includes automated exposure control, adjustment of the mA and/or kV according to patient size and/or use of iterative reconstruction technique. COMPARISON:  12/27/2021 FINDINGS: CT HEAD FINDINGS Brain: No evidence of acute infarction, hemorrhage, hydrocephalus, extra-axial collection or mass lesion/mass effect. Mild atrophic changes are noted. Vascular: No hyperdense vessel or unexpected calcification. Skull: Normal. Negative for fracture or focal lesion. Sinuses/Orbits: No acute finding. Other: None. CT CERVICAL SPINE FINDINGS Alignment: Reversal of the normal cervical lordosis is noted likely related muscular spasm. Skull base and vertebrae: 7 cervical segments are well visualized. Vertebral body height is well maintained. No acute fracture or acute facet abnormality is noted. Mild facet hypertrophic changes are noted. The odontoid is within normal limits. Soft tissues and spinal canal: No prevertebral fluid or swelling. No visible canal hematoma Upper chest: Visualized lung apices are within normal limits. Other: None IMPRESSION: CT of the head: Chronic atrophic changes without acute abnormality. CT of the cervical spine: Multilevel degenerative change without acute abnormality. Electronically Signed   By: Alcide Clever M.D.   On: 01/04/2022 03:25   DG Abdomen 1 View  Result Date: 01/02/2022 CLINICAL DATA:  Chronic abdominal pain and vomiting. EXAM: ABDOMEN - 1  VIEW COMPARISON:  CT abdomen pelvis dated December 27, 2021. FINDINGS: The bowel gas pattern is normal. No radio-opaque calculi or other significant radiographic abnormality are seen. Prior cholecystectomy. Bladder stimulator again noted. IMPRESSION: 1. Negative. Electronically Signed   By: Titus Dubin M.D.   On: 01/02/2022 15:08   CT Head Wo Contrast  Result Date: 12/27/2021 CLINICAL DATA:  Mental status change. EXAM: CT HEAD WITHOUT CONTRAST TECHNIQUE: Contiguous axial images were  obtained from the base of the skull through the vertex without intravenous contrast. RADIATION DOSE REDUCTION: This exam was performed according to the departmental dose-optimization program which includes automated exposure control, adjustment of the mA and/or kV according to patient size and/or use of iterative reconstruction technique. COMPARISON:  07/14/2019 FINDINGS: Brain: No evidence of acute infarction, hemorrhage, hydrocephalus, extra-axial collection or mass lesion/mass effect. There is mild diffuse low-attenuation within the subcortical and periventricular white matter compatible with chronic microvascular disease. Prominence of the sulci and ventricles compatible with brain atrophy. Vascular: No hyperdense vessel or unexpected calcification. Skull: Normal. Negative for fracture or focal lesion. Sinuses/Orbits: No acute finding. Other: None IMPRESSION: 1. No acute intracranial abnormalities. 2. Chronic small vessel ischemic disease and brain atrophy. Electronically Signed   By: Kerby Moors M.D.   On: 12/27/2021 11:07   CT Abdomen Pelvis W Contrast  Result Date: 12/27/2021 CLINICAL DATA:  Abdominal pain.  Agitation. EXAM: CT ABDOMEN AND PELVIS WITH CONTRAST TECHNIQUE: Multidetector CT imaging of the abdomen and pelvis was performed using the standard protocol following bolus administration of intravenous contrast. RADIATION DOSE REDUCTION: This exam was performed according to the departmental dose-optimization program which includes automated exposure control, adjustment of the mA and/or kV according to patient size and/or use of iterative reconstruction technique. CONTRAST:  148mL OMNIPAQUE IOHEXOL 300 MG/ML  SOLN COMPARISON:  CT abdomen dated 07/14/2019. FINDINGS: Lower chest: Mild bibasilar scarring/atelectasis. Hepatobiliary: No focal liver abnormality is seen. Status post cholecystectomy. No bile duct dilatation. Pancreas: Unremarkable. No pancreatic ductal dilatation or surrounding  inflammatory changes. Spleen: Normal in size without focal abnormality. Adrenals/Urinary Tract: Adrenal glands appear normal. Kidneys are unremarkable without suspicious mass, stone or hydronephrosis. No ureteral or bladder calculi are identified. Bladder appears normal. Stomach/Bowel: No dilated large or small bowel loops. No evidence of bowel wall inflammation. Appendix is normal. Stomach is unremarkable, partially decompressed. Vascular/Lymphatic: No abdominal aortic aneurysm. No acute-appearing vascular abnormality. No enlarged lymph nodes are seen within the abdomen or pelvis. Reproductive: Presumed hysterectomy.  No adnexal mass or free fluid. Other: There is no free fluid within the abdomen or pelvis on today's exam. No free intraperitoneal air. Musculoskeletal: No acute-appearing osseous abnormality. Mild degenerative spondylosis of the thoracolumbar spine. Stable chronic small anterior abdominal wall hernia, to the RIGHT of midline, again containing a small amount of fluid. IMPRESSION: 1. No acute findings within the abdomen or pelvis. No bowel obstruction or evidence of bowel wall inflammation. No evidence of acute solid organ abnormality. No renal or ureteral calculi. Appendix is normal. 2. Chronic/incidental findings detailed above. Electronically Signed   By: Franki Cabot M.D.   On: 12/27/2021 11:03   DG Chest Portable 1 View  Result Date: 12/27/2021 CLINICAL DATA:  Altered mental status.  Agitation. EXAM: PORTABLE CHEST 1 VIEW COMPARISON:  05/21/2020 FINDINGS: Lungs are hypoinflated with subtle hazy prominence of the perihilar vessels likely mild degree of vascular congestion. No effusion. Stable borderline cardiomegaly. Remainder of the exam is unchanged. IMPRESSION: Suggestion of minimal vascular congestion. Electronically Signed   By: Quillian Quince  Micheline Maze M.D.   On: 12/27/2021 09:52    Microbiology: Results for orders placed or performed during the hospital encounter of 01/04/22  Blood culture  (routine x 2)     Status: None (Preliminary result)   Collection Time: 01/04/22  2:31 AM   Specimen: Left Antecubital; Blood  Result Value Ref Range Status   Specimen Description LEFT ANTECUBITAL  Final   Special Requests   Final    BOTTLES DRAWN AEROBIC AND ANAEROBIC Blood Culture results may not be optimal due to an inadequate volume of blood received in culture bottles   Culture   Final    NO GROWTH < 24 HOURS Performed at [facility], [address], [place], [place] [address]    Report Status PENDING  Incomplete  Blood culture (routine x 2)     Status: None (Preliminary result)   Collection Time: 01/04/22  3:46 AM   Specimen: BLOOD RIGHT ARM  Result Value Ref Range Status   Specimen Description BLOOD RIGHT ARM  Final   Special Requests   Final    BOTTLES DRAWN AEROBIC AND ANAEROBIC Blood Culture results may not be optimal due to an inadequate volume of blood received in culture bottles   Culture   Final    NO GROWTH 1 DAY Performed at [facility], [address], [place], [place] [address]    Report Status PENDING  Incomplete  Urine Culture     Status: None   Collection Time: 01/04/22  4:05 AM   Specimen: Urine, Clean Catch  Result Value Ref Range Status   Specimen Description   Final    URINE, CLEAN CATCH Performed at [facility], [address] [place], [place] [address]    Special Requests   Final    NONE Performed at [facility], [address], [place], [place] [address]    Culture   Final    NO GROWTH Performed at [facility], [address], [place], [place] [address]    Report Status 01/05/2022 FINAL  Final  SARS Coronavirus 2 by RT PCR (hospital order, performed in [place] hospital lab) *cepheid single result test* Anterior Nasal Swab     Status: None   Collection Time: 01/04/22  3:28 PM   Specimen: Anterior Nasal Swab  Result Value Ref Range Status   SARS Coronavirus 2 by RT PCR NEGATIVE NEGATIVE Final     Comment: (NOTE) SARS-CoV-2 target nucleic acids are NOT DETECTED.  The SARS-CoV-2 RNA is generally detectable in upper and lower respiratory specimens during the acute phase of infection. The lowest concentration of SARS-CoV-2 viral copies this assay can detect is 250 copies / mL. A negative result does not preclude SARS-CoV-2 infection and should not be used as the sole basis for treatment or other patient management decisions.  A negative result may occur with improper specimen collection / handling, submission of specimen other than nasopharyngeal swab, presence of viral mutation(s) within the areas targeted by this assay, and inadequate number of viral copies (<250 copies / mL). A negative result must be combined with clinical observations, patient history, and epidemiological information.  Fact Sheet for Patients:   RoadLapTop.co.za  Fact Sheet for Healthcare Providers: http://kim-miller.com/  This test is not yet approved or  cleared by the [place] FDA and has been authorized for detection and/or diagnosis of SARS-CoV-2 by FDA under an Emergency Use Authorization (EUA).  This EUA will remain in effect (meaning this test can be used)  for the duration of the COVID-19 declaration under Section 564(b)(1) of the Act, 21 U.S.C. section 360bbb-3(b)(1), unless the authorization is terminated or revoked sooner.  Performed at [facility], [address] [place], [place] [address]     Labs: CBC: Recent Labs  Lab 01/02/22 1700 01/04/22 0231 01/05/22 0528  WBC 6.5 16.2* 7.5  NEUTROABS  --  12.6*  --   HGB 9.6* 10.5* 9.0*  HCT 31.8* 36.4 32.0*  MCV 71.3* 73.8* 74.4*  PLT 242 301 196   Basic Metabolic Panel: Recent Labs  Lab 01/02/22 1700 01/04/22 0231 01/05/22 0528  NA 140 139 143  K 3.5 3.3* 3.8  CL 108 106 115*  CO2 24 20* 21*  GLUCOSE 138* 177* 108*  BUN CREATININE 0.95 1.89* 1.27*   CALCIUM 8.9 8.8* 7.6*  MG  --  1.5*  --    Liver Function Tests: Recent Labs  Lab 01/02/22 1700 01/04/22 0231  AST 14* 19  ALT 9 10  ALKPHOS 76 79  BILITOT 0.6 0.5  PROT 7.4 7.7  ALBUMIN 3.4* 3.5   CBG: Recent Labs  Lab 01/04/22 1634 01/04/22 2140 01/05/22 0740 01/05/22 1315 01/05/22 1639  GLUCAP 135* 163* 114* 127* 137*    Discharge time spent: less than 30 minutes.  Signed: Hollice Espy, MD [facility] 01/05/2022

## 2022-01-05 NOTE — Assessment & Plan Note (Addendum)
Remained in sinus rhythm during this hospitalization.  Continued on Eliquis.

## 2022-01-05 NOTE — Assessment & Plan Note (Signed)
Secondary to volume loss from gastroenteritis.  Treated with IV fluids and by time of discharge, systolic blood pressure at 149.  Patient not orthostatic.

## 2022-01-05 NOTE — Assessment & Plan Note (Signed)
Stable, although noting that patient on metformin.  Her elevated lactic acid level up more attributed to intravascular volume depletion, however if she has recurrent episodes of lactic acidosis, need to consider discontinuing metformin for a different agent.

## 2022-01-05 NOTE — Progress Notes (Addendum)
Discharge instructions provided to patient and patients daughter. All medications, follow up appointments, and discharge instructions provided. IV out. Monitor off CCMD notified. Discharging home with family.  Foley removed at 1600 per MD order. Patient has not voided at time of discharge. Notified MD. Bladder scan 51. MD stated patient is ok to discharge. Educated patient if she does not void to call MD.  Patient unsteady on feet prior to discharge. Notified MD. Orders placed for home health OT/PT.  Era Bumpers, RN

## 2022-01-09 LAB — CULTURE, BLOOD (ROUTINE X 2)
Culture: NO GROWTH
Culture: NO GROWTH

## 2022-01-14 ENCOUNTER — Emergency Department
Admission: EM | Admit: 2022-01-14 | Discharge: 2022-01-15 | Disposition: A | Discharge status: Home / Self Care | Payer: Medicaid Other | Attending: Emergency Medicine | Admitting: Emergency Medicine

## 2022-01-14 ENCOUNTER — Telehealth: Payer: Self-pay | Admitting: Pulmonary Disease

## 2022-01-14 ENCOUNTER — Emergency Department: Payer: Medicaid Other

## 2022-01-14 DIAGNOSIS — J449 Chronic obstructive pulmonary disease, unspecified: Secondary | ICD-10-CM | POA: Insufficient documentation

## 2022-01-14 DIAGNOSIS — I5040 Unspecified combined systolic (congestive) and diastolic (congestive) heart failure: Secondary | ICD-10-CM | POA: Diagnosis not present

## 2022-01-14 DIAGNOSIS — Z7901 Long term (current) use of anticoagulants: Secondary | ICD-10-CM | POA: Insufficient documentation

## 2022-01-14 DIAGNOSIS — U071 COVID-19: Secondary | ICD-10-CM | POA: Diagnosis not present

## 2022-01-14 DIAGNOSIS — R0602 Shortness of breath: Secondary | ICD-10-CM | POA: Diagnosis present

## 2022-01-14 LAB — CBC WITH DIFFERENTIAL/PLATELET
Abs Immature Granulocytes: 0.04 10*3/uL (ref 0.00–0.07)
Basophils Absolute: 0.1 10*3/uL (ref 0.0–0.1)
Basophils Relative: 1 %
Eosinophils Absolute: 0 10*3/uL (ref 0.0–0.5)
Eosinophils Relative: 0 %
HCT: 32.5 % — ABNORMAL LOW (ref 36.0–46.0)
Hemoglobin: 9.2 g/dL — ABNORMAL LOW (ref 12.0–15.0)
Immature Granulocytes: 1 %
Lymphocytes Relative: 17 %
Lymphs Abs: 1.4 10*3/uL (ref 0.7–4.0)
MCH: 20.6 pg — ABNORMAL LOW (ref 26.0–34.0)
MCHC: 28.3 g/dL — ABNORMAL LOW (ref 30.0–36.0)
MCV: 72.7 fL — ABNORMAL LOW (ref 80.0–100.0)
Monocytes Absolute: 0.6 10*3/uL (ref 0.1–1.0)
Monocytes Relative: 8 %
Neutro Abs: 6.1 10*3/uL (ref 1.7–7.7)
Neutrophils Relative %: 73 %
Platelets: 220 10*3/uL (ref 150–400)
RBC: 4.47 MIL/uL (ref 3.87–5.11)
RDW: 17.2 % — ABNORMAL HIGH (ref 11.5–15.5)
WBC: 8.2 10*3/uL (ref 4.0–10.5)
nRBC: 0 % (ref 0.0–0.2)

## 2022-01-14 LAB — COMPREHENSIVE METABOLIC PANEL
ALT: 10 U/L (ref 0–44)
AST: 14 U/L — ABNORMAL LOW (ref 15–41)
Albumin: 3.4 g/dL — ABNORMAL LOW (ref 3.5–5.0)
Alkaline Phosphatase: 67 U/L (ref 38–126)
Anion gap: 10 (ref 5–15)
BUN: 20 mg/dL (ref 6–20)
CO2: 24 mmol/L (ref 22–32)
Calcium: 9 mg/dL (ref 8.9–10.3)
Chloride: 104 mmol/L (ref 98–111)
Creatinine, Ser: 1.21 mg/dL — ABNORMAL HIGH (ref 0.44–1.00)
GFR, Estimated: 51 mL/min — ABNORMAL LOW (ref >60)
Glucose, Bld: 131 mg/dL — ABNORMAL HIGH (ref 70–99)
Potassium: 4.7 mmol/L (ref 3.5–5.1)
Sodium: 138 mmol/L (ref 135–145)
Total Bilirubin: 0.5 mg/dL (ref 0.3–1.2)
Total Protein: 7.3 g/dL (ref 6.5–8.1)

## 2022-01-14 LAB — BRAIN NATRIURETIC PEPTIDE: B Natriuretic Peptide: 63.4 pg/mL (ref 0.0–100.0)

## 2022-01-14 LAB — TROPONIN I (HIGH SENSITIVITY): Troponin I (High Sensitivity): 3 ng/L (ref <18)

## 2022-01-14 MED ORDER — NIRMATRELVIR/RITONAVIR (PAXLOVID) TABLET (RENAL DOSING)
2.0000 | ORAL_TABLET | Freq: Two times a day (BID) | ORAL | Status: DC
  Administered 2022-01-15: 2 via ORAL
  Filled 2022-01-14: qty 20

## 2022-01-14 NOTE — Telephone Encounter (Signed)
Spoke with the pt  She started feeling bad last night- chills, SOB, wheezing  She took at home test and it was pos for covid  She states that she gets dizzy when she stands up and sees spots  O2 sat 94%- ? On or off o2  She was instructed to head to ED per Dr Darnell Level

## 2022-01-14 NOTE — ED Triage Notes (Signed)
Covid positive home test, copd, chf patient

## 2022-01-14 NOTE — ED Provider Notes (Signed)
[facility] Provider Note    Event Date/Time   First MD Initiated Contact with Patient 01/14/22 2255     (approximate)   History   Shortness of Breath (Covid positive home test, copd, chf patient )   HPI  [name] [name] is a 61 y.o. female who presents to the ED for evaluation of Shortness of Breath (Covid positive home test, copd, chf patient )   I reviewed medical DC summary from 9/19.  Morbidly obese patient with history of diastolic dysfunction, paroxysmal A-fib on Eliquis, COPD, RA on Plaquenil.  Was admitted for near syncope in the setting of a likely viral gastroenteritis and AKI treated with IV fluids.  Patient presents to the ED for evaluation of generalized malaise and weakness after testing positive for COVID at home today.  She reports symptoms just today.  Reports that she think she got it from her daughter, who tested positive for COVID recently as well.  She reports chronic respiratory failure on 3 L O2 since she had COVID back in 2020 and reports that she "almost died a couple times."  She reports explicit concern that she is severely ill again because she has COVID again.  Denies chest pain, shortness of breath, cough, abdominal pain, syncope or fever  Physical Exam   Triage Vital Signs: ED Triage Vitals  Enc Vitals Group     BP 01/14/22 2224 127/69     Pulse Rate 01/14/22 1757 66     Resp 01/14/22 1757 17     Temp 01/14/22 1757 98.7 F (37.1 C)     Temp Source 01/14/22 1757 Oral     SpO2 01/14/22 1757 98 %     Weight 01/14/22 1757 (!) 317 lb 7.4 oz (144 kg)     Height 01/14/22 1757 5\' 6"  (1.676 m)     Head Circumference --      Peak Flow --      Pain Score 01/14/22 1757 0     Pain Loc --      Pain Edu? --      Excl. in GC? --     Most recent vital signs: Vitals:   01/15/22 0045 01/15/22 0226  BP:  119/65  Pulse: 66 68  Resp:  18  Temp:  98.8 F (37.1 C)  SpO2: 100% 99%    General: Awake, no distress.  Morbidly obese.   Pleasant and conversational.  Looks well.  No distress. CV:  Good peripheral perfusion.  Resp:  Normal effort.  Abd:  No distention.  MSK:  No deformity noted.  Neuro:  No focal deficits appreciated. Other:     ED Results / Procedures / Treatments   Labs (all labs ordered are listed, but only abnormal results are displayed) Labs Reviewed  CBC WITH DIFFERENTIAL/PLATELET - Abnormal; Notable for the following components:      Result Value   Hemoglobin 9.2 (*)    HCT 32.5 (*)    MCV 72.7 (*)    MCH 20.6 (*)    MCHC 28.3 (*)    RDW 17.2 (*)    All other components within normal limits  COMPREHENSIVE METABOLIC PANEL - Abnormal; Notable for the following components:   Glucose, Bld 131 (*)    Creatinine, Ser 1.21 (*)    Albumin 3.4 (*)    AST 14 (*)    GFR, Estimated 51 (*)    All other components within normal limits  BRAIN NATRIURETIC PEPTIDE  TROPONIN I (HIGH  SENSITIVITY)  TROPONIN I (HIGH SENSITIVITY)    EKG Sinus rhythm with a rate of 71 bpm.  Normal axis and intervals.  No clear signs of acute ischemia.  RADIOLOGY CXR interpreted by me without evidence of acute cardiopulmonary pathology.  Official radiology report(s): DG Chest 2 View  Result Date: 01/14/2022 CLINICAL DATA:  Shortness of breath EXAM: CHEST - 2 VIEW COMPARISON:  12/27/2021, CT chest 07/29/2020, chest x-ray 05/21/2020 FINDINGS: Mild diffuse interstitial opacity without acute confluent airspace disease. Low lung volumes. Stable cardiomediastinal silhouette. No pneumothorax. IMPRESSION: No acute airspace disease or overt pulmonary edema. Electronically Signed   By: Donavan Foil M.D.   On: 01/14/2022 18:38    PROCEDURES and INTERVENTIONS:  .1-3 Lead EKG Interpretation  Performed by: Vladimir Crofts, MD Authorized by: Vladimir Crofts, MD     Interpretation: normal     ECG rate:  70   ECG rate assessment: normal     Rhythm: sinus rhythm     Ectopy: none     Conduction: normal     Medications   nirmatrelvir/ritonavir EUA (renal dosing) (PAXLOVID) 2 tablet (2 tablets Oral Given 01/15/22 6568)     IMPRESSION / MDM / ASSESSMENT AND PLAN / ED COURSE  I reviewed the triage vital signs and the nursing notes.  Differential diagnosis includes, but is not limited to, symptomatic COVID, ACS, PE, respiratory failure, pneumonia  {Patient presents with symptoms of an acute illness or injury that is potentially life-threatening.  61 year old female presents to the ED with malaise associated with positive home COVID test.  She look systemically well and has reassuring vital signs.  No respiratory failure and sats are 99-100% on her chronic 3 L.  No pneumonia on x-ray and blood work is reassuring with negative troponin, chronic anemia around baseline, normal BNP.  We will send for a second troponin and continue to monitor her the patient on telemetry.  We will initiate Paxlovid and reassess with anticipation of outpatient management if her symptoms continue to be controlled and has no respiratory failure, as well as her second troponin.     FINAL CLINICAL IMPRESSION(S) / ED DIAGNOSES   Final diagnoses:  [id number]     Rx / DC Orders   ED Discharge Orders     None        Note:  This document was prepared using Dragon voice recognition software and may include unintentional dictation errors.   Vladimir Crofts, MD 01/15/22 [phone]

## 2022-01-14 NOTE — ED Provider Triage Note (Signed)
Emergency Medicine Provider Triage Evaluation Note  [name] [name] , a 61 y.o. female  was evaluated in triage.  Patient has a history of hypertension, CHF, COPD and pneumonia.  Pt complains of shortness of breath.  Patient recently tested positive for COVID-19 and states that the last time she had COVID she required intubation.  She denies current chest pain.  She states that she has had chills at home.  Unsure if she has had recent weight gain.  She uses 3 L of oxygen chronically with no new demands.  Review of Systems  Positive: Patient has SOB.  Negative: No chest pain or chest tightness.   Physical Exam  LMP 04/20/2001  Gen:   Awake, no distress   Resp:  Normal effort  MSK:   Moves extremities without difficulty  Other:    Medical Decision Making  Medically screening exam initiated at 5:57 PM.  Appropriate orders placed.  [name] [name] was informed that the remainder of the evaluation will be completed by another provider, this initial triage assessment does not replace that evaluation, and the importance of remaining in the ED until their evaluation is complete.     Vallarie Mare [place], [place] 01/14/22 1800

## 2022-01-15 LAB — TROPONIN I (HIGH SENSITIVITY): Troponin I (High Sensitivity): 3 ng/L (ref <18)

## 2022-01-15 NOTE — Discharge Instructions (Signed)
Take the Paxlovid as prescribed twice daily for 5 days to help reduce the severity of your COVID infection.  You will take 2 tablets per dose, 1 tablet of the 150 mg nirmatrelvir and 1 tablet of the 100 mg ritonavir.  Take these both at the same time 2 times daily for 5 days.  If you develop any further worsening symptoms, chest pains, passing out or other worsening then please return to the ED.  Please continue your other medications alongside this.

## 2022-01-18 MED ORDER — PRAMIPEXOLE 0.5 MG TABLET
ORAL_TABLET | Freq: Every evening | ORAL | 1 refills | 90.00000 days
Start: 2022-01-18 — End: ?

## 2022-01-21 ENCOUNTER — Telehealth: Payer: Self-pay | Admitting: Pulmonary Disease

## 2022-01-21 DIAGNOSIS — R110 Nausea: Principal | ICD-10-CM

## 2022-01-21 DIAGNOSIS — L03116 Cellulitis of left lower limb: Principal | ICD-10-CM

## 2022-01-21 DIAGNOSIS — A4902 Methicillin resistant Staphylococcus aureus infection, unspecified site: Principal | ICD-10-CM

## 2022-01-21 MED ORDER — AZITHROMYCIN 250 MG PO TABS: ORAL_TABLET | ORAL | 0 refills | Status: AC

## 2022-01-21 MED ORDER — PROMETHAZINE 12.5 MG TABLET
ORAL_TABLET | Freq: Four times a day (QID) | ORAL | 0 refills | 10 days | Status: CP | PRN
Start: 2022-01-21 — End: 2022-01-31

## 2022-01-21 MED ORDER — PROMETHAZINE 25 MG TABLET
ORAL_TABLET | Freq: Four times a day (QID) | ORAL | 0 refills | 10 days | PRN
Start: 2022-01-21 — End: 2022-01-31

## 2022-01-21 MED ORDER — LINEZOLID 600 MG TABLET
ORAL_TABLET | Freq: Two times a day (BID) | ORAL | 0 refills | 7 days
Start: 2022-01-21 — End: 2022-01-28

## 2022-01-21 NOTE — Telephone Encounter (Signed)
Patient is aware of below message/recommendations and voiced her understanding.  Zpak has been ordered. Nothing further needed.

## 2022-01-21 NOTE — Telephone Encounter (Signed)
Unable to speak with patient's daughter, as she is not listed on DPR.  Spoke to patient. She stated that she was dx with Covid 01/14/2022 and tx with paxlovid. She read the directions incorrectly and has not been taking it as prescribed. She has been taking one tablet in the morning and one in the evening.  C/o nausea, prod cough with yellow sputum,wheezing and increased SOB. Last fever two days ago.  She wears 3L cont. Spo2 maintaining around 94%. She is questioning if she needs to extend paxlovid Rx.   Dr. Patsey Berthold, please advise. Thanks

## 2022-01-21 NOTE — Telephone Encounter (Signed)
The nausea etc. may be related to the Paxlovid.  Since she has not been taking it as directed I would just stop it at this point.  She is past the window anyway for an antiviral.  Her saturations appear to be holding.  For the changes in her sputum color we will send in a Z-Pak.  I am concerned about sending prednisone like medications to her diabetes.  If her symptoms worsen she needs to return to ED.

## 2022-02-02 ENCOUNTER — Encounter: Payer: Medicaid Other | Admitting: Student in an Organized Health Care Education/Training Program

## 2022-02-03 MED ORDER — PRAMIPEXOLE 0.5 MG TABLET
ORAL_TABLET | Freq: Every evening | ORAL | 1 refills | 0 days
Start: 2022-02-03 — End: ?

## 2022-02-04 ENCOUNTER — Ambulatory Visit
Payer: Medicaid Other | Attending: Student in an Organized Health Care Education/Training Program | Admitting: Student in an Organized Health Care Education/Training Program

## 2022-02-04 ENCOUNTER — Encounter: Payer: Self-pay | Admitting: Student in an Organized Health Care Education/Training Program

## 2022-02-04 VITALS — BP 105/64 | HR 63 | Temp 97.3°F | Resp 18 | Ht 66.0 in | Wt 315.0 lb

## 2022-02-04 DIAGNOSIS — E1142 Type 2 diabetes mellitus with diabetic polyneuropathy: Secondary | ICD-10-CM

## 2022-02-04 MED ORDER — PRAMIPEXOLE 0.5 MG TABLET
ORAL_TABLET | Freq: Every evening | ORAL | 1 refills | 90 days | Status: CP
Start: 2022-02-04 — End: 2023-02-04

## 2022-02-04 NOTE — Progress Notes (Signed)
Nursing Pain Medication Assessment:  Safety precautions to be maintained throughout the outpatient stay will include: orient to surroundings, keep bed in low position, maintain call bell within reach at all times, provide assistance with transfer out of bed and ambulation.  Medication Inspection Compliance: Pill count conducted under aseptic conditions, in front of the patient. Neither the pills nor the bottle was removed from the patient's sight at any time. Once count was completed pills were immediately returned to the patient in their original bottle.  Medication: Oxycodone/APAP Pill/Patch Count:  0 of 90 pills remain Pill/Patch Appearance: Markings consistent with prescribed medication Bottle Appearance: Standard pharmacy container. Clearly labeled. Filled Date: 59 / 8 / 2023 Last Medication intake:  Yesterday

## 2022-02-04 NOTE — Progress Notes (Signed)
No appointment needed.

## 2022-02-21 MED ORDER — FAMOTIDINE 20 MG TABLET
ORAL_TABLET | Freq: Every day | ORAL | 2 refills | 0 days
Start: 2022-02-21 — End: ?

## 2022-02-21 MED ORDER — VITAMIN C 500 MG TABLET
ORAL_TABLET | Freq: Every day | ORAL | 3 refills | 0 days
Start: 2022-02-21 — End: ?

## 2022-02-21 MED ORDER — METOPROLOL TARTRATE 25 MG TABLET
ORAL_TABLET | Freq: Two times a day (BID) | ORAL | 2 refills | 0 days
Start: 2022-02-21 — End: ?

## 2022-02-21 MED ORDER — CHOLECALCIFEROL (VITAMIN D3) 125 MCG (5,000 UNIT) CAPSULE
ORAL_CAPSULE | 2 refills | 0 days
Start: 2022-02-21 — End: ?

## 2022-02-22 ENCOUNTER — Telehealth: Payer: Self-pay

## 2022-02-22 MED ORDER — CHOLECALCIFEROL (VITAMIN D3) 125 MCG (5,000 UNIT) CAPSULE
ORAL_CAPSULE | 2 refills | 0 days | Status: CP
Start: 2022-02-22 — End: ?

## 2022-02-22 MED ORDER — FAMOTIDINE 20 MG TABLET
ORAL_TABLET | Freq: Every day | ORAL | 2 refills | 28 days | Status: CP
Start: 2022-02-22 — End: ?

## 2022-02-22 MED ORDER — VITAMIN C 500 MG TABLET
ORAL_TABLET | Freq: Every day | ORAL | 3 refills | 30 days | Status: CP
Start: 2022-02-22 — End: ?

## 2022-02-22 MED ORDER — METOPROLOL TARTRATE 25 MG TABLET
ORAL_TABLET | Freq: Two times a day (BID) | ORAL | 2 refills | 30 days | Status: CP
Start: 2022-02-22 — End: ?

## 2022-02-22 NOTE — Telephone Encounter (Signed)
called patient let her know that rx was on hold at the [facility] and i told them to get rx ready for her.

## 2022-02-22 NOTE — Telephone Encounter (Signed)
called [facility] they have rx on hold they will go ahead and get ready.

## 2022-02-22 NOTE — Telephone Encounter (Signed)
called pharmacy because patient should have enough medication to do until feb 2024. per the pharmacy they transfered rx to the [facility]

## 2022-02-22 NOTE — Telephone Encounter (Signed)
called patient to make sure about pharmacy and pt does want her medication to go to [facility]. i told patient that i would call phamacy and see if they have and will call her back.

## 2022-02-22 NOTE — Telephone Encounter (Signed)
pt left message that she needed a refill on the seroquel .

## 2022-02-23 ENCOUNTER — Ambulatory Visit
Admit: 2022-02-23 | Discharge: 2022-02-24 | Payer: PRIVATE HEALTH INSURANCE | Attending: "Endocrinology | Primary: "Endocrinology

## 2022-02-23 DIAGNOSIS — T148XXA Other injury of unspecified body region, initial encounter: Principal | ICD-10-CM

## 2022-02-26 ENCOUNTER — Ambulatory Visit: Admit: 2022-02-26 | Discharge: 2022-02-27 | Payer: PRIVATE HEALTH INSURANCE | Attending: Family | Primary: Family

## 2022-02-26 DIAGNOSIS — Z794 Long term (current) use of insulin: Principal | ICD-10-CM

## 2022-02-26 DIAGNOSIS — R110 Nausea: Principal | ICD-10-CM

## 2022-02-26 DIAGNOSIS — D649 Anemia, unspecified: Principal | ICD-10-CM

## 2022-02-26 DIAGNOSIS — E782 Mixed hyperlipidemia: Principal | ICD-10-CM

## 2022-02-26 DIAGNOSIS — E1165 Type 2 diabetes mellitus with hyperglycemia: Principal | ICD-10-CM

## 2022-02-26 DIAGNOSIS — Z1231 Encounter for screening mammogram for malignant neoplasm of breast: Principal | ICD-10-CM

## 2022-02-26 DIAGNOSIS — E039 Hypothyroidism, unspecified: Principal | ICD-10-CM

## 2022-02-26 DIAGNOSIS — G2581 Restless legs syndrome: Principal | ICD-10-CM

## 2022-02-26 DIAGNOSIS — Z1321 Encounter for screening for nutritional disorder: Principal | ICD-10-CM

## 2022-02-26 MED ORDER — ATORVASTATIN 80 MG TABLET
ORAL_TABLET | Freq: Every evening | ORAL | 1 refills | 90 days | Status: CP
Start: 2022-02-26 — End: 2023-02-26

## 2022-02-26 MED ORDER — PROMETHAZINE 12.5 MG TABLET
ORAL_TABLET | Freq: Four times a day (QID) | ORAL | 3 refills | 10 days | Status: CP | PRN
Start: 2022-02-26 — End: ?

## 2022-02-26 MED ORDER — AMLODIPINE 10 MG TABLET
ORAL_TABLET | Freq: Every day | ORAL | 3 refills | 90 days | Status: CP
Start: 2022-02-26 — End: 2023-02-26

## 2022-02-27 ENCOUNTER — Other Ambulatory Visit: Payer: Self-pay

## 2022-02-27 ENCOUNTER — Emergency Department: Payer: Medicaid Other

## 2022-02-27 ENCOUNTER — Inpatient Hospital Stay
Admission: EM | Admit: 2022-02-27 | Discharge: 2022-03-01 | DRG: 690 | Disposition: A | Discharge status: Home / Self Care | Payer: Medicaid Other | Attending: Internal Medicine | Admitting: Internal Medicine

## 2022-02-27 DIAGNOSIS — E1151 Type 2 diabetes mellitus with diabetic peripheral angiopathy without gangrene: Secondary | ICD-10-CM | POA: Diagnosis present

## 2022-02-27 DIAGNOSIS — Z9981 Dependence on supplemental oxygen: Secondary | ICD-10-CM

## 2022-02-27 DIAGNOSIS — Z950 Presence of cardiac pacemaker: Secondary | ICD-10-CM

## 2022-02-27 DIAGNOSIS — F32A Depression, unspecified: Secondary | ICD-10-CM | POA: Diagnosis present

## 2022-02-27 DIAGNOSIS — Z8614 Personal history of Methicillin resistant Staphylococcus aureus infection: Secondary | ICD-10-CM

## 2022-02-27 DIAGNOSIS — Z881 Allergy status to other antibiotic agents status: Secondary | ICD-10-CM

## 2022-02-27 DIAGNOSIS — I110 Hypertensive heart disease with heart failure: Secondary | ICD-10-CM | POA: Diagnosis present

## 2022-02-27 DIAGNOSIS — E1143 Type 2 diabetes mellitus with diabetic autonomic (poly)neuropathy: Secondary | ICD-10-CM | POA: Diagnosis present

## 2022-02-27 DIAGNOSIS — Z811 Family history of alcohol abuse and dependence: Secondary | ICD-10-CM

## 2022-02-27 DIAGNOSIS — E119 Type 2 diabetes mellitus without complications: Secondary | ICD-10-CM

## 2022-02-27 DIAGNOSIS — Z885 Allergy status to narcotic agent status: Secondary | ICD-10-CM

## 2022-02-27 DIAGNOSIS — E039 Hypothyroidism, unspecified: Secondary | ICD-10-CM | POA: Diagnosis present

## 2022-02-27 DIAGNOSIS — I480 Paroxysmal atrial fibrillation: Secondary | ICD-10-CM | POA: Diagnosis present

## 2022-02-27 DIAGNOSIS — L97529 Non-pressure chronic ulcer of other part of left foot with unspecified severity: Secondary | ICD-10-CM | POA: Diagnosis present

## 2022-02-27 DIAGNOSIS — R55 Syncope and collapse: Secondary | ICD-10-CM | POA: Diagnosis present

## 2022-02-27 DIAGNOSIS — K3184 Gastroparesis: Secondary | ICD-10-CM | POA: Insufficient documentation

## 2022-02-27 DIAGNOSIS — L03115 Cellulitis of right lower limb: Secondary | ICD-10-CM | POA: Diagnosis present

## 2022-02-27 DIAGNOSIS — Z813 Family history of other psychoactive substance abuse and dependence: Secondary | ICD-10-CM

## 2022-02-27 DIAGNOSIS — Z818 Family history of other mental and behavioral disorders: Secondary | ICD-10-CM

## 2022-02-27 DIAGNOSIS — W19XXXA Unspecified fall, initial encounter: Secondary | ICD-10-CM | POA: Diagnosis present

## 2022-02-27 DIAGNOSIS — M069 Rheumatoid arthritis, unspecified: Secondary | ICD-10-CM | POA: Diagnosis present

## 2022-02-27 DIAGNOSIS — E11621 Type 2 diabetes mellitus with foot ulcer: Secondary | ICD-10-CM | POA: Diagnosis present

## 2022-02-27 DIAGNOSIS — Z6841 Body Mass Index (BMI) 40.0 and over, adult: Secondary | ICD-10-CM

## 2022-02-27 DIAGNOSIS — F419 Anxiety disorder, unspecified: Secondary | ICD-10-CM | POA: Diagnosis present

## 2022-02-27 DIAGNOSIS — Z8249 Family history of ischemic heart disease and other diseases of the circulatory system: Secondary | ICD-10-CM

## 2022-02-27 DIAGNOSIS — E872 Acidosis, unspecified: Secondary | ICD-10-CM | POA: Diagnosis present

## 2022-02-27 DIAGNOSIS — N319 Neuromuscular dysfunction of bladder, unspecified: Secondary | ICD-10-CM | POA: Diagnosis present

## 2022-02-27 DIAGNOSIS — Z888 Allergy status to other drugs, medicaments and biological substances status: Secondary | ICD-10-CM

## 2022-02-27 DIAGNOSIS — F319 Bipolar disorder, unspecified: Secondary | ICD-10-CM | POA: Diagnosis present

## 2022-02-27 DIAGNOSIS — Z8616 Personal history of COVID-19: Secondary | ICD-10-CM

## 2022-02-27 DIAGNOSIS — J9611 Chronic respiratory failure with hypoxia: Secondary | ICD-10-CM | POA: Diagnosis present

## 2022-02-27 DIAGNOSIS — Z7984 Long term (current) use of oral hypoglycemic drugs: Secondary | ICD-10-CM

## 2022-02-27 DIAGNOSIS — I10 Essential (primary) hypertension: Secondary | ICD-10-CM | POA: Diagnosis present

## 2022-02-27 DIAGNOSIS — A419 Sepsis, unspecified organism: Principal | ICD-10-CM

## 2022-02-27 DIAGNOSIS — E1122 Type 2 diabetes mellitus with diabetic chronic kidney disease: Secondary | ICD-10-CM | POA: Diagnosis present

## 2022-02-27 DIAGNOSIS — Z87891 Personal history of nicotine dependence: Secondary | ICD-10-CM

## 2022-02-27 DIAGNOSIS — Z882 Allergy status to sulfonamides status: Secondary | ICD-10-CM

## 2022-02-27 DIAGNOSIS — N179 Acute kidney failure, unspecified: Secondary | ICD-10-CM | POA: Diagnosis present

## 2022-02-27 DIAGNOSIS — N3000 Acute cystitis without hematuria: Principal | ICD-10-CM | POA: Diagnosis present

## 2022-02-27 DIAGNOSIS — I959 Hypotension, unspecified: Secondary | ICD-10-CM | POA: Diagnosis present

## 2022-02-27 DIAGNOSIS — K529 Noninfective gastroenteritis and colitis, unspecified: Secondary | ICD-10-CM | POA: Diagnosis present

## 2022-02-27 DIAGNOSIS — Z969 Presence of functional implant, unspecified: Secondary | ICD-10-CM

## 2022-02-27 DIAGNOSIS — J449 Chronic obstructive pulmonary disease, unspecified: Secondary | ICD-10-CM | POA: Diagnosis present

## 2022-02-27 DIAGNOSIS — Z79899 Other long term (current) drug therapy: Secondary | ICD-10-CM

## 2022-02-27 DIAGNOSIS — Z7901 Long term (current) use of anticoagulants: Secondary | ICD-10-CM

## 2022-02-27 DIAGNOSIS — I5032 Chronic diastolic (congestive) heart failure: Secondary | ICD-10-CM | POA: Diagnosis present

## 2022-02-27 LAB — BASIC METABOLIC PANEL
Anion gap: 8 (ref 5–15)
BUN: 19 mg/dL (ref 6–20)
CO2: 24 mmol/L (ref 22–32)
Calcium: 9.5 mg/dL (ref 8.9–10.3)
Chloride: 108 mmol/L (ref 98–111)
Creatinine, Ser: 1.37 mg/dL — ABNORMAL HIGH (ref 0.44–1.00)
GFR, Estimated: 44 mL/min — ABNORMAL LOW (ref >60)
Glucose, Bld: 195 mg/dL — ABNORMAL HIGH (ref 70–99)
Potassium: 3.7 mmol/L (ref 3.5–5.1)
Sodium: 140 mmol/L (ref 135–145)

## 2022-02-27 LAB — CBC
HCT: 36.8 % (ref 36.0–46.0)
Hemoglobin: 11 g/dL — ABNORMAL LOW (ref 12.0–15.0)
MCH: 21 pg — ABNORMAL LOW (ref 26.0–34.0)
MCHC: 29.9 g/dL — ABNORMAL LOW (ref 30.0–36.0)
MCV: 70.4 fL — ABNORMAL LOW (ref 80.0–100.0)
Platelets: 318 10*3/uL (ref 150–400)
RBC: 5.23 MIL/uL — ABNORMAL HIGH (ref 3.87–5.11)
RDW: 17.5 % — ABNORMAL HIGH (ref 11.5–15.5)
WBC: 10.5 10*3/uL (ref 4.0–10.5)
nRBC: 0.2 % (ref 0.0–0.2)

## 2022-02-27 LAB — TROPONIN I (HIGH SENSITIVITY): Troponin I (High Sensitivity): 6 ng/L (ref <18)

## 2022-02-27 MED ORDER — SODIUM CHLORIDE 0.9 % IV BOLUS
1000.0000 mL | Freq: Once | INTRAVENOUS | Status: AC
  Administered 2022-02-28: 1000 mL via INTRAVENOUS

## 2022-02-27 MED ORDER — METOCLOPRAMIDE HCL 5 MG/ML IJ SOLN
10.0000 mg | Freq: Once | INTRAMUSCULAR | Status: AC
  Administered 2022-02-28: 10 mg via INTRAVENOUS
  Filled 2022-02-27: qty 2

## 2022-02-27 MED ORDER — ACETAMINOPHEN 500 MG PO TABS
1000.0000 mg | ORAL_TABLET | Freq: Once | ORAL | Status: AC
  Administered 2022-02-28: 1000 mg via ORAL
  Filled 2022-02-27: qty 2

## 2022-02-27 NOTE — ED Triage Notes (Signed)
BIB EMS from home for fall due to dizziness. Pt denies hitting head. Pt is on eliquis. Denies LOC 74/42 lying BP 82/58 - sitting up 130/88 last pressure  CAOx4 per EMS 3 liters chronic. HX COPD and CHF. 12lead Sinus. Bradycardia at times 50. BGL 211 98.7 Temp 20G LAC

## 2022-02-27 NOTE — ED Notes (Signed)
This RN sent down blue, green and lavender.

## 2022-02-27 NOTE — ED Provider Notes (Incomplete)
[facility] [place] [facility] Provider Note    Event Date/Time   First MD Initiated Contact with Patient 02/27/22 2259     (approximate)   History   Dizziness   HPI  [name] [name] is a 61 y.o. female   Past medical history of HF, COPD on 3 L nasal cannula at baseline, CKD, RA, hypothyroid, bipolar, diabetes who presents to the emergency department with 2 days of nausea, vomiting, loose stools without blood and then subsequently today she has felt very lightheaded upon standing and with minimal exertion, has not eaten or drank much in the last 2 days due to nausea and vomiting that she attributes to gastroparesis.  She has no abdominal pain.  She had a syncopal episode today where she fell did not hit her head.  No  injuries from that episode.  On review of systems denies chest pain, difficulty breathing, cough, sputum production, fever or chills or dysuria.  History was obtained via the patient. External medical chart review including discharge summary dated 01/05/2022 for which she was admitted for AKI, nausea and vomiting likely attributable to gastroparesis, hypotension.      Physical Exam   Triage Vital Signs: ED Triage Vitals  Enc Vitals Group     BP 02/27/22 2231 93/67     Pulse Rate 02/27/22 2230 (!) 58     Resp 02/27/22 2230 16     Temp 02/27/22 2231 98.3 F (36.8 C)     Temp Source 02/27/22 2231 Oral     SpO2 02/27/22 2231 95 %     Weight 02/27/22 2228 (!) 315 lb (142.9 kg)     Height 02/27/22 2228 5\' 6"  (1.676 m)     Head Circumference --      Peak Flow --      Pain Score 02/27/22 2228 4     Pain Loc --      Pain Edu? --      Excl. in GC? --     Most recent vital signs: Vitals:   02/28/22 0100 02/28/22 0130  BP: (!) 92/53 (!) 91/55  Pulse: 64 (!) 58  Resp: (!) 22 17  Temp:    SpO2: 100% 99%    General: Awake, no distress.  CV:  Membranes dry and poor skin turgor.  Looks clinically dehydrated.  Initial blood pressure 90s over  60s. Resp:  Normal effort.  Clear to auscultation Abd:  No distention.  No rigidity or tenderness. 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 She has yellow stool which is guaiac negative. Neurological deficits including motor or sensory exam, facial asymmetry, no field cuts   ED Results / Procedures / Treatments   Labs (all labs ordered are listed, but only abnormal results are displayed) Labs Reviewed  BASIC METABOLIC PANEL - Abnormal; Notable for the following components:      Result Value   Glucose, Bld 195 (*)    Creatinine, Ser 1.37 (*)    GFR, Estimated 44 (*)    All other components within normal limits  CBC - Abnormal; Notable for the following components:   RBC 5.23 (*)    Hemoglobin 11.0 (*)    MCV 70.4 (*)    MCH 21.0 (*)    MCHC 29.9 (*)    RDW 17.5 (*)  All other components within normal limits  LACTIC ACID, PLASMA - Abnormal; Notable for the following components:   Lactic Acid, Venous 2.8 (*)    All other components within normal limits  TSH - Abnormal; Notable for the following components:   TSH 7.462 (*)    All other components within normal limits  T4, FREE - Abnormal; Notable for the following components:   Free T4 1.41 (*)    All other components within normal limits  URINE CULTURE  URINALYSIS, ROUTINE W REFLEX MICROSCOPIC  LACTIC ACID, PLASMA  TROPONIN I (HIGH SENSITIVITY)  TROPONIN I (HIGH SENSITIVITY)     I reviewed labs and they are notable for acute kidney injury with a creatinine of 1.37 and an estimated GFR of 44, hemoglobin of 11 which is hemoconcentrated compared to prior and a lactic acidosis of 2.8.  EKG  ED ECG REPORT I, Pilar Jarvis, the attending physician, personally viewed and interpreted this ECG.   Date: 02/27/2022  EKG Time: 2231  Rate: 57   Rhythm: normal sinus rhythm  Axis: nl  Intervals:Qtc 501  ST&T Change: no ischemic change    RADIOLOGY I independently reviewed and interpreted cxr and see no pneumothorax    PROCEDURES:  Critical Care performed: Yes, see critical care procedure note(s)  .Critical Care  Performed by: Pilar Jarvis, MD Authorized by: Pilar Jarvis, MD   Critical care provider statement:    Critical care time (minutes):  30   Critical care was necessary to treat or prevent imminent or life-threatening deterioration of the following conditions:  Sepsis   Critical care was time spent personally by me on the following activities:  Development of treatment plan with patient or surrogate, discussions with consultants, evaluation of patient's response to treatment, examination of patient, ordering and review of laboratory studies, ordering and review of radiographic studies, ordering and performing treatments and interventions, pulse oximetry, re-evaluation of patient's condition and review of old charts    MEDICATIONS ORDERED IN ED: Medications  vancomycin (VANCOCIN) IVPB 1000 mg/200 mL premix (0 mg Intravenous Stopped 02/28/22 0141)    Followed by  vancomycin (VANCOREADY) IVPB 1500 mg/300 mL (1,500 mg Intravenous New Bag/Given 02/28/22 0146)  metoCLOPramide (REGLAN) injection 10 mg (10 mg Intravenous Given 02/28/22 0001)  sodium chloride 0.9 % bolus 1,000 mL (0 mLs Intravenous Stopped 02/28/22 0122)  acetaminophen (TYLENOL) tablet 1,000 mg (1,000 mg Oral Given 02/28/22 0000)  iohexol (OMNIPAQUE) 300 MG/ML solution 80 mL (80 mLs Intravenous Contrast Given 02/28/22 0031)  sodium chloride 0.9 % bolus 1,000 mL (1,000 mLs Intravenous New Bag/Given 02/28/22 0145)    Consultants:  I spoke with hospitalist for admission and regarding care plan for this patient.   IMPRESSION / MDM / ASSESSMENT AND PLAN / ED COURSE  I reviewed the triage vital signs and the nursing notes.                               Differential diagnosis includes, but is not limited to, Esther enteritis, gastroparesis, intra-abdominal infection, urinary tract infection, respiratory infection, orthostatic hypotension, dehydration or AKI, considered but but less likely ACS or CVA given no chest pain, no focal deficits   The patient is on the cardiac monitor to evaluate for evidence of arrhythmia and/or significant heart rate changes.  MDM: We will obtain basic labs to assess for electrolyte disturbance and AKI in the setting of clinically dehydrated patient, it does look like she has a mild  AKI and will give IV crystalloid infusions to address this issue.  Will give Reglan for nausea and vomiting likely in the setting of gastroparesis.  Given some abdominal discomfort and nausea and vomiting with diarrhea in this patient with multiple comorbidities will get an CT scan of the abdomen pelvis to assess for surgical abdominal pathologies.  She does have some cellulitic changes to the ulceration on the dorsum of the foot, doubt osteo but will check x-ray, she has not seen her podiatrist in over 1 year and has been having wound care only by her daughter, will give antibiotics to address the cellulitic changes.  She is borderline hypotensive and has a lactic acidosis of 2.8 with the only source of thus far cellulitic changes to the toe given IV vancomycin and IV crystalloids sepsis bolus per ideal body weight.  Her CT scan shows no acute pathology but there is some stranding around the bladder concerning for cystitis, will follow-up urinalysis results accordingly.  She was admitted to the hospitalist service.   Patient's presentation is most consistent with acute presentation with potential threat to life or bodily function.       FINAL CLINICAL IMPRESSION(S) / ED DIAGNOSES   Final diagnoses:  Cellulitis of right lower extremity  Sepsis, due to unspecified organism, unspecified whether acute organ dysfunction present [facility])      Rx / DC Orders   ED Discharge Orders     None        Note:  This document was prepared using Dragon voice recognition software and may include unintentional dictation errors.    Pilar Jarvis, MD 02/28/22 Therisa Doyne    Pilar Jarvis, MD 02/28/22 [phone]

## 2022-02-28 ENCOUNTER — Emergency Department: Payer: Medicaid Other

## 2022-02-28 DIAGNOSIS — L03115 Cellulitis of right lower limb: Secondary | ICD-10-CM | POA: Diagnosis present

## 2022-02-28 DIAGNOSIS — A419 Sepsis, unspecified organism: Secondary | ICD-10-CM | POA: Diagnosis not present

## 2022-02-28 DIAGNOSIS — R6520 Severe sepsis without septic shock: Secondary | ICD-10-CM | POA: Diagnosis not present

## 2022-02-28 DIAGNOSIS — N179 Acute kidney failure, unspecified: Secondary | ICD-10-CM

## 2022-02-28 DIAGNOSIS — E11621 Type 2 diabetes mellitus with foot ulcer: Secondary | ICD-10-CM | POA: Diagnosis present

## 2022-02-28 DIAGNOSIS — Z6841 Body Mass Index (BMI) 40.0 and over, adult: Secondary | ICD-10-CM | POA: Diagnosis not present

## 2022-02-28 DIAGNOSIS — F319 Bipolar disorder, unspecified: Secondary | ICD-10-CM | POA: Diagnosis present

## 2022-02-28 DIAGNOSIS — N3000 Acute cystitis without hematuria: Secondary | ICD-10-CM | POA: Diagnosis not present

## 2022-02-28 DIAGNOSIS — R55 Syncope and collapse: Secondary | ICD-10-CM

## 2022-02-28 DIAGNOSIS — J9611 Chronic respiratory failure with hypoxia: Secondary | ICD-10-CM | POA: Diagnosis present

## 2022-02-28 DIAGNOSIS — E1151 Type 2 diabetes mellitus with diabetic peripheral angiopathy without gangrene: Secondary | ICD-10-CM | POA: Diagnosis present

## 2022-02-28 DIAGNOSIS — F419 Anxiety disorder, unspecified: Secondary | ICD-10-CM | POA: Diagnosis present

## 2022-02-28 DIAGNOSIS — E039 Hypothyroidism, unspecified: Secondary | ICD-10-CM

## 2022-02-28 DIAGNOSIS — I110 Hypertensive heart disease with heart failure: Secondary | ICD-10-CM | POA: Diagnosis present

## 2022-02-28 DIAGNOSIS — L97529 Non-pressure chronic ulcer of other part of left foot with unspecified severity: Secondary | ICD-10-CM | POA: Diagnosis present

## 2022-02-28 DIAGNOSIS — E1122 Type 2 diabetes mellitus with diabetic chronic kidney disease: Secondary | ICD-10-CM | POA: Diagnosis present

## 2022-02-28 DIAGNOSIS — M069 Rheumatoid arthritis, unspecified: Secondary | ICD-10-CM | POA: Diagnosis present

## 2022-02-28 DIAGNOSIS — I480 Paroxysmal atrial fibrillation: Secondary | ICD-10-CM

## 2022-02-28 DIAGNOSIS — I959 Hypotension, unspecified: Secondary | ICD-10-CM | POA: Diagnosis present

## 2022-02-28 DIAGNOSIS — K3184 Gastroparesis: Secondary | ICD-10-CM | POA: Insufficient documentation

## 2022-02-28 DIAGNOSIS — Z8616 Personal history of COVID-19: Secondary | ICD-10-CM | POA: Diagnosis not present

## 2022-02-28 DIAGNOSIS — W19XXXA Unspecified fall, initial encounter: Secondary | ICD-10-CM | POA: Diagnosis present

## 2022-02-28 DIAGNOSIS — J449 Chronic obstructive pulmonary disease, unspecified: Secondary | ICD-10-CM | POA: Diagnosis present

## 2022-02-28 DIAGNOSIS — Z87891 Personal history of nicotine dependence: Secondary | ICD-10-CM | POA: Diagnosis not present

## 2022-02-28 DIAGNOSIS — E872 Acidosis, unspecified: Secondary | ICD-10-CM | POA: Diagnosis present

## 2022-02-28 DIAGNOSIS — Z881 Allergy status to other antibiotic agents status: Secondary | ICD-10-CM

## 2022-02-28 DIAGNOSIS — Z7901 Long term (current) use of anticoagulants: Secondary | ICD-10-CM | POA: Diagnosis not present

## 2022-02-28 DIAGNOSIS — E1143 Type 2 diabetes mellitus with diabetic autonomic (poly)neuropathy: Secondary | ICD-10-CM | POA: Diagnosis present

## 2022-02-28 DIAGNOSIS — I5032 Chronic diastolic (congestive) heart failure: Secondary | ICD-10-CM | POA: Diagnosis present

## 2022-02-28 LAB — URINALYSIS, ROUTINE W REFLEX MICROSCOPIC
Bilirubin Urine: NEGATIVE
Glucose, UA: NEGATIVE mg/dL
Hgb urine dipstick: NEGATIVE
Ketones, ur: NEGATIVE mg/dL
Nitrite: NEGATIVE
Protein, ur: >=300 mg/dL — AB
Specific Gravity, Urine: 1.037 — ABNORMAL HIGH (ref 1.005–1.030)
Squamous Epithelial / HPF: NONE SEEN (ref 0–5)
WBC, UA: >50 WBC/hpf — ABNORMAL HIGH (ref 0–5)
pH: 7 (ref 5.0–8.0)

## 2022-02-28 LAB — GLUCOSE, CAPILLARY
Glucose-Capillary: 143 mg/dL — ABNORMAL HIGH (ref 70–99)
Glucose-Capillary: 130 mg/dL — ABNORMAL HIGH (ref 70–99)
Glucose-Capillary: 109 mg/dL — ABNORMAL HIGH (ref 70–99)
Glucose-Capillary: 105 mg/dL — ABNORMAL HIGH (ref 70–99)
Glucose-Capillary: 155 mg/dL — ABNORMAL HIGH (ref 70–99)

## 2022-02-28 LAB — T4, FREE: Free T4: 1.41 ng/dL — ABNORMAL HIGH (ref 0.61–1.12)

## 2022-02-28 LAB — TROPONIN I (HIGH SENSITIVITY): Troponin I (High Sensitivity): 5 ng/L (ref <18)

## 2022-02-28 LAB — LACTIC ACID, PLASMA
Lactic Acid, Venous: 1.3 mmol/L (ref 0.5–1.9)
Lactic Acid, Venous: 2.8 mmol/L (ref 0.5–1.9)

## 2022-02-28 LAB — TSH: TSH: 7.462 u[IU]/mL — ABNORMAL HIGH (ref 0.350–4.500)

## 2022-02-28 MED ORDER — ATORVASTATIN CALCIUM 80 MG PO TABS
80.0000 mg | ORAL_TABLET | Freq: Every day | ORAL | Status: DC
  Administered 2022-02-28 – 2022-03-01 (×2): 80 mg via ORAL
  Filled 2022-02-28 (×2): qty 1

## 2022-02-28 MED ORDER — IOHEXOL 300 MG/ML  SOLN
80.0000 mL | Freq: Once | INTRAMUSCULAR | Status: AC | PRN
  Administered 2022-02-28: 80 mL via INTRAVENOUS

## 2022-02-28 MED ORDER — SODIUM CHLORIDE 0.9 % IV BOLUS
1000.0000 mL | Freq: Once | INTRAVENOUS | Status: AC
  Administered 2022-02-28: 1000 mL via INTRAVENOUS

## 2022-02-28 MED ORDER — QUETIAPINE FUMARATE 300 MG PO TABS
300.0000 mg | ORAL_TABLET | Freq: Every day | ORAL | Status: DC
  Administered 2022-02-28: 300 mg via ORAL
  Filled 2022-02-28: qty 1

## 2022-02-28 MED ORDER — OXYCODONE-ACETAMINOPHEN 5-325 MG PO TABS: 1.0000 | ORAL_TABLET | Freq: Three times a day (TID) | ORAL | Status: DC | PRN

## 2022-02-28 MED ORDER — FLUOXETINE HCL 20 MG PO CAPS
40.0000 mg | ORAL_CAPSULE | Freq: Every day | ORAL | Status: DC
  Administered 2022-02-28 – 2022-03-01 (×2): 40 mg via ORAL
  Filled 2022-02-28 (×2): qty 2

## 2022-02-28 MED ORDER — LACTATED RINGERS IV BOLUS
500.0000 mL | Freq: Once | INTRAVENOUS | Status: AC
  Administered 2022-02-28: 500 mL via INTRAVENOUS

## 2022-02-28 MED ORDER — PANTOPRAZOLE SODIUM 40 MG IV SOLR
40.0000 mg | INTRAVENOUS | Status: DC
  Administered 2022-02-28 – 2022-03-01 (×2): 40 mg via INTRAVENOUS
  Filled 2022-02-28 (×2): qty 10

## 2022-02-28 MED ORDER — ACETAMINOPHEN 650 MG RE SUPP: 650.0000 mg | Freq: Four times a day (QID) | RECTAL | Status: DC | PRN

## 2022-02-28 MED ORDER — ACETAMINOPHEN 325 MG PO TABS: 650.0000 mg | ORAL_TABLET | Freq: Four times a day (QID) | ORAL | Status: DC | PRN

## 2022-02-28 MED ORDER — HYDROXYCHLOROQUINE SULFATE 200 MG PO TABS
200.0000 mg | ORAL_TABLET | Freq: Two times a day (BID) | ORAL | Status: DC
  Administered 2022-02-28 – 2022-03-01 (×3): 200 mg via ORAL
  Filled 2022-02-28 (×3): qty 1

## 2022-02-28 MED ORDER — METRONIDAZOLE 500 MG/100ML IV SOLN
500.0000 mg | Freq: Two times a day (BID) | INTRAVENOUS | Status: DC
  Administered 2022-02-28: 500 mg via INTRAVENOUS
  Filled 2022-02-28 (×2): qty 100

## 2022-02-28 MED ORDER — BUSPIRONE HCL 10 MG PO TABS
10.0000 mg | ORAL_TABLET | Freq: Two times a day (BID) | ORAL | Status: DC
  Administered 2022-02-28 – 2022-03-01 (×3): 10 mg via ORAL
  Filled 2022-02-28 (×3): qty 1

## 2022-02-28 MED ORDER — LACTATED RINGERS IV SOLN: INTRAVENOUS | Status: AC

## 2022-02-28 MED ORDER — APIXABAN 5 MG PO TABS
5.0000 mg | ORAL_TABLET | Freq: Two times a day (BID) | ORAL | Status: DC
  Administered 2022-02-28 – 2022-03-01 (×3): 5 mg via ORAL
  Filled 2022-02-28 (×3): qty 1

## 2022-02-28 MED ORDER — INSULIN ASPART 100 UNIT/ML IJ SOLN: 0.0000 [IU] | Freq: Every day | INTRAMUSCULAR | Status: DC

## 2022-02-28 MED ORDER — ONDANSETRON HCL 4 MG PO TABS: 4.0000 mg | ORAL_TABLET | Freq: Four times a day (QID) | ORAL | Status: DC | PRN

## 2022-02-28 MED ORDER — VANCOMYCIN HCL IN DEXTROSE 1-5 GM/200ML-% IV SOLN
1000.0000 mg | Freq: Once | INTRAVENOUS | Status: AC
  Administered 2022-02-28: 1000 mg via INTRAVENOUS
  Filled 2022-02-28: qty 200

## 2022-02-28 MED ORDER — IPRATROPIUM-ALBUTEROL 0.5-2.5 (3) MG/3ML IN SOLN: 3.0000 mL | RESPIRATORY_TRACT | Status: DC | PRN

## 2022-02-28 MED ORDER — SODIUM CHLORIDE 0.9 % IV SOLN
2.0000 g | INTRAVENOUS | Status: DC
  Administered 2022-02-28 – 2022-03-01 (×2): 2 g via INTRAVENOUS
  Filled 2022-02-28 (×4): qty 20

## 2022-02-28 MED ORDER — PRAMIPEXOLE DIHYDROCHLORIDE 0.25 MG PO TABS
0.2500 mg | ORAL_TABLET | Freq: Every day | ORAL | Status: DC
  Administered 2022-02-28: 0.25 mg via ORAL
  Filled 2022-02-28: qty 1

## 2022-02-28 MED ORDER — LEVOTHYROXINE SODIUM 88 MCG PO TABS
88.0000 ug | ORAL_TABLET | Freq: Every day | ORAL | Status: DC
  Administered 2022-02-28 – 2022-03-01 (×2): 88 ug via ORAL
  Filled 2022-02-28 (×2): qty 1

## 2022-02-28 MED ORDER — INSULIN ASPART 100 UNIT/ML IJ SOLN
0.0000 [IU] | Freq: Three times a day (TID) | INTRAMUSCULAR | Status: DC
  Administered 2022-02-28 – 2022-03-01 (×2): 3 [IU] via SUBCUTANEOUS
  Filled 2022-02-28 (×2): qty 1

## 2022-02-28 MED ORDER — VANCOMYCIN HCL 1500 MG/300ML IV SOLN
1500.0000 mg | Freq: Once | INTRAVENOUS | Status: AC
  Administered 2022-02-28: 1500 mg via INTRAVENOUS
  Filled 2022-02-28: qty 300

## 2022-02-28 MED ORDER — ONDANSETRON HCL 4 MG/2ML IJ SOLN
4.0000 mg | Freq: Four times a day (QID) | INTRAMUSCULAR | Status: DC | PRN
  Administered 2022-02-28: 4 mg via INTRAVENOUS
  Filled 2022-02-28: qty 2

## 2022-02-28 NOTE — ED Notes (Signed)
Patient incontinent to stool and urine. Patient was cleaned and linens were changed. Patient is laying in bed. Xray at bedside.

## 2022-02-28 NOTE — H&P (Addendum)
History and Physical    Patient: [name] [name] [id number] DOB: 08/20/1960 DOA: 02/27/2022 DOS: the patient was seen and examined on 02/28/2022 PCP: Verita Lamb, NP  Patient coming from: Home  Chief Complaint:  Chief Complaint  Patient presents with   Dizziness    HPI: [name] [name] is a 61 y.o. female with medical history significant for morbid obesity, hypertension, diastolic heart failure, paroxysmal atrial fibrillation on Eliquis, bipolar disorder, multiple antibiotic allergies, rheumatoid arthritis, diabetes mellitus complicated by prior diabetic foot ulcers, PVD requiring left great toe amputation and COPD on home O2 at 3 L who presented to the ED by EMS for fall that was preceded by dizziness.  Patient reports she has been having vomiting and diarrhea for the past 3 days.  Vomiting is nonbloody and nonbilious.  She denies abdominal pain and denies fever or chills.  Also denies dysuria.  She has no chest pain, shortness of breath or cough. Of note, patient was hospitalized in September 2023 with a similar presentation. ED course and data review: Afebrile on arrival with hypotension, systolic mostly in the low to mid 90s, pulse in the 50s to 60s, O2 sat mid to high 90s on home flow rate of 3 L Labs significant for WBC 10,500, Hemoglobin at baseline at 11.  Creatinine 1.37 up from baseline of 0.89.  Urinalysis showing moderate leukocyte esterase and many bacteria.  Lactic acid 1.3, TSH 7.46 with free T41.41.  Troponin of 5.  EKG, personally viewed and interpreted showed sinus rhythm at 57 with no acute ischemic ST T wave changes. Imaging CT head nonacute.  CT abdomen and pelvis concerning for infectious or inflammatory cystitis.  Chest x-ray showed cardiomegaly with mild pulmonary vascular congestion. Patient started on vancomycin and given a 2 L NS bolus.  Hospitalist consulted for admission.   Review of Systems: As mentioned in the history of present illness. All other systems  reviewed and are negative.  Past Medical History:  Diagnosis Date   Abdominal pain 02/12/2019   Abdominal wall hernia [id number]   Acute metabolic encephalopathy [id number]   AMS (altered mental status) 12/28/2021   Anxiety    Arthritis    Rheumatoid   C. difficile colitis    Chronic diastolic heart failure ([place])    COVID-19 03/23/2019   Diagnosed at [facility] (send-out) on 03/23/2019   Depression    Diabetes mellitus    states no meds or diet restrictions  at present   Diastolic CHF (HCC)    Esophagitis    Fluid retention    GERD (gastroesophageal reflux disease)    Hiatal hernia    Hypertension    Hypokalemia due to loss of potassium 10/21/2015   Overview:  Associated with 3 weeks of diarrhea  And QT prolongation.   Hypothyroidism    IBS (irritable bowel syndrome)    Moderate episode of recurrent major depressive disorder ([place]) 06/03/2004   Morbid obesity (HCC)    MRSA (methicillin resistant Staphylococcus aureus) infection 11/2017   left inner thigh abcess   Neurogenic bladder    has pacemaker   Neuropathy    Obesity    Panic attacks    Pneumonia due to COVID-19 virus    Rheumatoid arthritis ([place])    Sleep apnea    STATES SEVERE, CANT TOLERATE MASK- LAST STUDY YEARS AGO   Past Surgical History:  Procedure Laterality Date   ABDOMINAL HYSTERECTOMY     CHOLECYSTECTOMY     DG GREAT TOE RIGHT FOOT  02/23/2018  EYE SURGERY     bilateral cataract extraction with IOL   FOOT SURGERY Left 05/13/2021   [facility]   HERNIA REPAIR     ventral hernia with strangulation   IRRIGATION AND DEBRIDEMENT FOOT Left 01/11/2020   Procedure: IRRIGATION AND DEBRIDEMENT FOOT;  Surgeon: Samara Deist, DPM;  Location: [facility] ORS;  Service: Podiatry;  Laterality: Left;   LAPAROSCOPIC GASTRIC BANDING  03/20/2007   TEE WITHOUT CARDIOVERSION N/A 07/16/2019   Procedure: TRANSESOPHAGEAL ECHOCARDIOGRAM (TEE);  Surgeon: Kate Sable, MD;  Location: [facility] ORS;  Service: Cardiovascular;  Laterality: N/A;    TONSILLECTOMY     TUBAL LIGATION     Social History:  reports that she quit smoking about 22 years ago. Her smoking use included cigarettes. She has a 54.00 pack-year smoking history. She has never used smokeless tobacco. She reports that she does not drink alcohol and does not use drugs.  Allergies  Allergen Reactions   Cephalexin Hives   Codeine Palpitations, Nausea Only, Nausea And Vomiting, Rash and Shortness Of Breath    "makes heart fly, she gets flushed and passes out"   Doxycycline Rash   Propoxyphene Rash and Shortness Of Breath    Increase heart rate   Sulfa Antibiotics Palpitations, Nausea Only, Shortness Of Breath and Hives    "makes heart fly, she gets flushed and passes out"   Clindamycin Rash    Tongue swelling, oral sores, and Mouth rash   Lovenox [Enoxaparin Sodium] Hives   Hydrocodone Nausea And Vomiting    Hear racing & breaks out into a cold sweat.   Meropenem Rash    Erythematous, hot, pruritic rash over arms, chest, back, abdomen, and face occurred at the end of meropenem infusion on 02/22/18    Family History  Problem Relation Age of Onset   Heart failure Father    Bipolar disorder Father    Alcohol abuse Father    Anxiety disorder Father    Depression Father    Heart disease Brother    Heart attack Brother 74       MI s/p stents placed   Anxiety disorder Sister    Depression Sister    Anxiety disorder Sister    Depression Sister    Bipolar disorder Sister    Alcohol abuse Sister    Drug abuse Sister    Heart attack Brother     Prior to Admission medications   Medication Sig Start Date End Date Taking? Authorizing Provider  acetaminophen (TYLENOL) 325 MG tablet Take 2 tablets (650 mg total) by mouth every 6 (six) hours as needed for mild pain or moderate pain (or Fever >/= 101). 01/15/20   Raiford Noble Latif, DO  amLODipine (NORVASC) 10 MG tablet Take 10 mg by mouth daily. 12/10/21   [provider]  ascorbic acid (VITAMIN C) 500 MG tablet  Take 1 tablet (500 mg total) by mouth daily. 05/05/19   Lavina Hamman, MD  atorvastatin (LIPITOR) 80 MG tablet Take 80 mg by mouth daily. 12/10/21   [provider]  Budeson-Glycopyrrol-Formoterol (BREZTRI AEROSPHERE) 160-9-4.8 MCG/ACT AERO Inhale 2 puffs into the lungs in the morning and at bedtime. 10/01/21   Tyler Pita, MD  busPIRone (BUSPAR) 10 MG tablet Take 1 tablet (10 mg total) by mouth 2 (two) times daily. 12/15/21   Clapacs, Madie Reno, MD  Calcium Citrate-Vitamin D3 (GNP CALCIUM CITRATE+D MAXIMUM) 315-6.25 MG-MCG TABS Take 1 tablet by mouth daily. 12/10/21   [provider]  Cholecalciferol (VITAMIN D) 125 MCG (  5000 UT) CAPS Take 1 capsule by mouth daily. 11/29/19   [provider]  ELIQUIS 5 MG TABS tablet TAKE 1 TABLET BY MOUTH TWICE DAILY Patient taking differently: Take 5 mg by mouth 2 (two) times daily. 06/26/21   Tyler Pita, MD  famotidine (PEPCID) 20 MG tablet Take 1 tablet (20 mg total) by mouth daily. 05/05/19   Lavina Hamman, MD  FLUoxetine (PROZAC) 40 MG capsule Take 1 capsule (40 mg total) by mouth daily. 12/15/21 12/15/22  Clapacs, Madie Reno, MD  folic acid (FOLVITE) 1 MG tablet Take 1 mg by mouth daily.     [provider]  hydroxychloroquine (PLAQUENIL) 200 MG tablet Take 200 mg by mouth 2 (two) times daily. 12/10/21   [provider]  levothyroxine (SYNTHROID, LEVOTHROID) 88 MCG tablet Take 88 mcg by mouth daily before breakfast.    [provider]  metFORMIN (GLUCOPHAGE-XR) 500 MG 24 hr tablet Take 500 mg by mouth daily. 12/31/19   [provider]  metoCLOPramide (REGLAN) 5 MG/5ML solution Take 5 mg by mouth 4 (four) times daily -  before meals and at bedtime. 12/20/21   [provider]  metoprolol tartrate (LOPRESSOR) 25 MG tablet Take 25 mg by mouth 2 (two) times daily. 10/10/20   [provider]  oxyCODONE-acetaminophen (PERCOCET/ROXICET) 5-325 MG tablet Take 1 tablet by mouth every 8 (eight)  hours as needed for severe pain.    [provider]  pramipexole (MIRAPEX) 0.125 MG tablet Take 0.25 mg by mouth at bedtime.     [provider]  pregabalin (LYRICA) 100 MG capsule Take 1 capsule (100 mg total) by mouth 3 (three) times daily. 09/24/21   Gillis Santa, MD  QUEtiapine (SEROQUEL) 300 MG tablet TAKE ONE TABLET BY MOUTH AT BEDTIME. 12/15/21   Clapacs, Madie Reno, MD  TRADJENTA 5 MG TABS tablet Take 5 mg by mouth daily. 12/31/19   [provider]  zinc sulfate 220 (50 Zn) MG capsule Take 220 mg by mouth at bedtime.  12/31/19   [provider]  zolpidem (AMBIEN) 5 MG tablet Take 1 tablet (5 mg total) by mouth at bedtime. 01/20/21   Gonzella Lex, MD    Physical Exam: Vitals:   02/28/22 0204 02/28/22 0209 02/28/22 0230 02/28/22 0241  BP: (!) 95/52 (!) 95/56  (!) 95/54  Pulse: (!) 52 (!) 53  (!) 52  Resp: 15 16  17   Temp:   97.8 F (36.6 C)   TempSrc:   Oral   SpO2: 100% 100%  100%  Weight:      Height:       Physical Exam Vitals and nursing note reviewed.  Constitutional:      General: She is not in acute distress. HENT:     Head: Normocephalic and atraumatic.  Cardiovascular:     Rate and Rhythm: Normal rate and regular rhythm.     Heart sounds: Normal heart sounds.  Pulmonary:     Effort: Pulmonary effort is normal.     Breath sounds: Normal breath sounds.  Abdominal:     Palpations: Abdomen is soft.     Tenderness: There is no abdominal tenderness.  Skin:    Comments: 1 cm diameter small black eschar on plantar aspect under first left MTP joint. Amputated left great toe  Neurological:     Mental Status: Mental status is at baseline.     Labs on Admission: I have personally reviewed following labs and imaging studies  CBC:  Recent Labs  Lab 02/27/22 2235  WBC 10.5  HGB 11.0*  HCT 36.8  MCV 70.4*  PLT 0000000   Basic Metabolic Panel: Recent Labs  Lab 02/27/22 2235  NA 140  K 3.7  CL 108  CO2 24  GLUCOSE 195*  BUN 19   CREATININE 1.37*  CALCIUM 9.5   GFR: Estimated Creatinine Clearance: 63.9 mL/min (A) (by C-G formula based on SCr of 1.37 mg/dL (H)). Liver Function Tests: No results for input(s): "AST", "ALT", "ALKPHOS", "BILITOT", "PROT", "ALBUMIN" in the last 168 hours. No results for input(s): "LIPASE", "AMYLASE" in the last 168 hours. No results for input(s): "AMMONIA" in the last 168 hours. Coagulation Profile: No results for input(s): "INR", "PROTIME" in the last 168 hours. Cardiac Enzymes: No results for input(s): "CKTOTAL", "CKMB", "CKMBINDEX", "TROPONINI" in the last 168 hours. BNP (last 3 results) No results for input(s): "PROBNP" in the last 8760 hours. HbA1C: No results for input(s): "HGBA1C" in the last 72 hours. CBG: No results for input(s): "GLUCAP" in the last 168 hours. Lipid Profile: No results for input(s): "CHOL", "HDL", "LDLCALC", "TRIG", "CHOLHDL", "LDLDIRECT" in the last 72 hours. Thyroid Function Tests: Recent Labs    02/27/22 2235  TSH 7.462*  FREET4 1.41*   Anemia Panel: No results for input(s): "VITAMINB12", "FOLATE", "FERRITIN", "TIBC", "IRON", "RETICCTPCT" in the last 72 hours. Urine analysis:    Component Value Date/Time   COLORURINE AMBER (A) 02/28/2022 0140   APPEARANCEUR TURBID (A) 02/28/2022 0140   APPEARANCEUR Clear 11/30/2013 1010   LABSPEC 1.037 (H) 02/28/2022 0140   LABSPEC 1.015 11/30/2013 1010   PHURINE 7.0 02/28/2022 0140   GLUCOSEU NEGATIVE 02/28/2022 0140   GLUCOSEU >=500 11/30/2013 1010   HGBUR NEGATIVE 02/28/2022 0140   BILIRUBINUR NEGATIVE 02/28/2022 0140   BILIRUBINUR Negative 11/30/2013 1010   KETONESUR NEGATIVE 02/28/2022 0140   PROTEINUR >=300 (A) 02/28/2022 0140   UROBILINOGEN 0.2 03/14/2007 1000   NITRITE NEGATIVE 02/28/2022 0140   LEUKOCYTESUR MODERATE (A) 02/28/2022 0140   LEUKOCYTESUR Negative 11/30/2013 1010    Radiological Exams on Admission: CT Abdomen Pelvis W Contrast  Result Date: 02/28/2022 CLINICAL DATA:   Nausea/vomiting Abdominal pain, acute, nonlocalized EXAM: CT ABDOMEN AND PELVIS WITH CONTRAST TECHNIQUE: Multidetector CT imaging of the abdomen and pelvis was performed using the standard protocol following bolus administration of intravenous contrast. RADIATION DOSE REDUCTION: This exam was performed according to the departmental dose-optimization program which includes automated exposure control, adjustment of the mA and/or kV according to patient size and/or use of iterative reconstruction technique. CONTRAST:  73mL OMNIPAQUE IOHEXOL 300 MG/ML  SOLN COMPARISON:  01/04/2022 FINDINGS: Lower chest: No acute abnormality.  Small hiatal hernia. Hepatobiliary: No focal liver abnormality is seen. Status post cholecystectomy. No biliary dilatation. Pancreas: Unremarkable. No pancreatic ductal dilatation or surrounding inflammatory changes. Spleen: Normal in size without focal abnormality. Adrenals/Urinary Tract: The adrenal glands are unremarkable. The kidneys are normal in position. Mild bilateral renal cortical atrophy, stable. The kidneys are otherwise unremarkable. The bladder is partially decompressed. Despite this, there is mild circumferential bladder wall thickening and perivesicular inflammatory stranding, new since prior examination which may reflect changes of a superimposed infectious or inflammatory cystitis. Previously noted Foley catheter has been removed. Stomach/Bowel: Stomach is within normal limits. Appendix appears normal. No evidence of bowel wall thickening, distention, or inflammatory changes. Vascular/Lymphatic: Shotty bilateral external iliac lymphadenopathy is again identified and appears stable since prior examination, possibly reactive or inflammatory in nature. No additional pathologic adenopathy within the abdomen and pelvis. The abdominal  vasculature is unremarkable. Reproductive: Status post hysterectomy. No adnexal masses. Other: Stable moderate right parasagittal fat and fluid containing  ventral hernia. No abdominopelvic ascites. Musculoskeletal: Osseous structures are age-appropriate. No acute bone abnormality. Left sacral plexus neurostimulator is unchanged in position. IMPRESSION: 1. Mild circumferential bladder wall thickening and perivesicular inflammatory stranding, new since prior examination which may reflect changes of a superimposed infectious or inflammatory cystitis. Correlation with urinalysis and urine culture may be helpful. 2. Stable moderate right parasagittal fat and fluid containing ventral hernia. 3. Small hiatal hernia. Electronically Signed   By: Helyn Numbers M.D.   On: 02/28/2022 00:58   CT HEAD WO CONTRAST  Result Date: 02/28/2022 CLINICAL DATA:  Syncope. EXAM: CT HEAD WITHOUT CONTRAST TECHNIQUE: Contiguous axial images were obtained from the base of the skull through the vertex without intravenous contrast. RADIATION DOSE REDUCTION: This exam was performed according to the departmental dose-optimization program which includes automated exposure control, adjustment of the mA and/or kV according to patient size and/or use of iterative reconstruction technique. COMPARISON:  Head CT dated 01/04/2022. FINDINGS: Brain: Mild diffuse cortical atrophy. The gray-white matter discrimination is preserved. There is no acute intracranial hemorrhage. No mass effect or midline shift. No extra-axial fluid collection. Vascular: No hyperdense vessel or unexpected calcification. Skull: Normal. Negative for fracture or focal lesion. Sinuses/Orbits: No acute finding. Other: None IMPRESSION: 1. No acute intracranial pathology. 2. Mild diffuse cortical atrophy. Electronically Signed   By: Elgie Collard M.D.   On: 02/28/2022 00:48   DG Foot Complete Left  Result Date: 02/28/2022 CLINICAL DATA:  Cellulitis and ulcer. EXAM: LEFT FOOT - COMPLETE 3+ VIEW COMPARISON:  Left foot radiograph dated 01/10/2020. FINDINGS: Amputation of the mid portion of the first metatarsal. There is no acute  fracture or dislocation. No periosteal elevation or bone erosion to suggest acute osteomyelitis. The bones are osteopenic. There is hammertoe deformity. There is diffuse soft tissue thickening of the forefoot. A 3 mm linear radiopaque foreign object noted in the superficial soft tissues of the plantar forefoot at the level of the second metatarsal head. No soft tissue gas. IMPRESSION: 1. No acute fracture or dislocation. No radiographic evidence of acute osteomyelitis. 2. A 3 mm linear radiopaque foreign object in the superficial soft tissues of the plantar forefoot. Electronically Signed   By: Elgie Collard M.D.   On: 02/28/2022 00:45   DG Chest Port 1 View  Result Date: 02/27/2022 CLINICAL DATA:  Questionable sepsis, evaluate abnormality. EXAM: PORTABLE CHEST 1 VIEW COMPARISON:  Radiographs dated January 14, 2022 FINDINGS: The heart is enlarged. Mild pulmonary vascular congestion. No focal consolidation or pleural effusion. No acute osseous abnormality IMPRESSION: Cardiomegaly with mild pulmonary vascular congestion. Electronically Signed   By: Larose Hires D.O.   On: 02/27/2022 23:49     Data Reviewed: Relevant notes from primary care and specialist visits, past discharge summaries as available in EHR, including Care Everywhere. Prior diagnostic testing as pertinent to current admission diagnoses Updated medications and problem lists for reconciliation ED course, including vitals, labs, imaging, treatment and response to treatment Triage notes, nursing and pharmacy notes and ED provider's notes Notable results as noted in HPI   Assessment and Plan: * Severe sepsis (HCC) Sepsis criteria includes  hypotension, AKI, leukocytosis, UTI with suspected gastroenteritis/acute diarrheal illness Continue sepsis fluids and monitor for fluid overload given CHF history Suspected etiologies include UTI, gastroenteritis, treatment as outlined below  Acute gastroenteritis Follow GI panel and stool for  C. difficile IV hydration, IV antiemetics  and supportive care IV Protonix Clear liquid diet  Urinary tract infection Urinalysis consistent with UTI and CT abdomen and pelvis suggesting infectious or inflammatory cystitis Rocephin (patient has tolerated this in the past) Follow cultures  Hypotension Secondary to severe sepsis Hold antihypertensives and treat sepsis as outlined above  Near syncope and fall Patient had a fall preceded by dizziness likely related to hypotension from sepsis Treatment as above Fall precautions  AKI (acute kidney injury) ([place]) Secondary to severe sepsis IV fluid resuscitation Avoid nephrotoxins  Diabetic ulcer of left foot ([place]) Has a ulcer plantar aspect left foot Consider podiatry consult  Chronic diastolic CHF (congestive heart failure) (HCC) Clinically euvolemic though chest x-ray showing pulmonary vascular congestion Monitor for fluid overload given aggressive fluid resuscitation and treatment of severe sepsis Holding metoprolol due to hypotension  Essential (primary) hypertension Holding amlodipine and metoprolol due to hypotension  COPD (chronic obstructive pulmonary disease) (HCC) Chronic respiratory failure with hypoxia Not acutely exacerbated and O2 requirement is at baseline Continue home inhalers DuoNebs as needed  PAF (paroxysmal atrial fibrillation) (HCC) Currently in sinus rhythm with rate in the 50s and 60s Holding metoprolol due to hypotension Continue apixaban  Hypothyroidism TSH elevated to 7.46 with free T4 1.41   Rheumatoid arthritis (HCC) Stable Continue Plaquenil Continue home Percocet as needed  Bipolar disorder, unspecified ([place]) Continue quetiapine and Prozac  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[employer]) No acute issues suspected  Allergy to multiple antibiotics  patient has hives with Keflex, rash with doxycycline, palpitations with sulfa was, tongue swelling with clindamycin and  rash with meropenem  Morbid obesity with BMI of 50.0-59.9, adult (HCC) Complicating factor to overall prognosis and care        DVT prophylaxis: Lovenox  Consults: none  Advance Care Planning:   Code Status: Prior   Family Communication: none  Disposition Plan: Back to previous home environment  Severity of Illness: The appropriate patient status for this patient is INPATIENT. Inpatient status is judged to be reasonable and necessary in order to provide the required intensity of service to ensure the patient's safety. The patient's presenting symptoms, physical exam findings, and initial radiographic and laboratory data in the context of their chronic comorbidities is felt to place them at high risk for further clinical deterioration. Furthermore, it is not anticipated that the patient will be medically stable for discharge from the hospital within 2 midnights of admission.   * I certify that at the point of admission it is my clinical judgment that the patient will require inpatient hospital care spanning beyond 2 midnights from the point of admission due to high intensity of service, high risk for further deterioration and high frequency of surveillance required.*  Author: Athena Masse, MD 02/28/2022 2:55 AM  For on call review [email or web address].

## 2022-02-28 NOTE — Assessment & Plan Note (Signed)
Continue quetiapine and Prozac

## 2022-02-28 NOTE — Assessment & Plan Note (Addendum)
-   due to GI losses - home meds on hold

## 2022-02-28 NOTE — Progress Notes (Signed)
PHARMACY -  BRIEF ANTIBIOTIC NOTE   Pharmacy has received consult(s) for Vancomycin from an ED provider.  The patient's profile has been reviewed for ht/wt/allergies/indication/available labs.    One time order(s) placed for Vancomycin 2500 mg IV X 1.   Further antibiotics/pharmacy consults should be ordered by admitting physician if indicated.                       Thank you, Landrey Mahurin D 02/28/2022  12:13 AM

## 2022-02-28 NOTE — Progress Notes (Addendum)
Progress Note    [name] [name]   [id number]  DOB: 09/18/1960  DOA: 02/27/2022     0 PCP: Verita Lamb, NP  Initial CC: dizziness   Hospital Course: [name] [name] is a 61 yo female with PMH DMII (better controlled lately), diabetic neuropathy, gastroparesis, diabetic foot infections, osteomyelitis, morbid obesity, HTN, diastolic CHF, PAF, bipolar, RA, PVD, COPD, chronic hypoxic respiratory failure on 3 L chronically.  She presented to the hospital with complaints of dizziness and recent poor intake.  She had associated nausea and vomiting consistent with her gastroparesis.  She also had mentioned some recent diarrhea, no abdominal pain. Urine has also been burning with voiding recently. She underwent work-up on admission.  UA was notable for moderate LE, negative nitrite, greater than 50 WBC, many bacteria. CT abdomen/pelvis was obtained which showed mild circumferential bladder wall thickening and perivesicular inflammatory stranding concerning for underlying infection. She also has a chronic left plantar ulcer which was draining blood recently.  X-ray showed no significant soft tissue edema.  There was a small 3 mm linear radiopaque object noted in the superficial soft tissue of the forefoot.  There were no signs of infection involving plantar surface of her foot.  She did initially see of a dose of vancomycin in the ER.  She was continued on Rocephin on admission for UTI treatment.  Interval History:  No events overnight.  Resting comfortably in bed when seen.  Appetite improving and she was asking for diet advancement.  No further nausea or vomiting.  Denies any abdominal pain. No pain or swelling beyond baseline in left foot.  Assessment and Plan: * Acute cystitis - severe sepsis ruled out - UA noted with mod LE, neg nitrite, many bacteria, >50 WBC - CT shows thickened bladder wall and perivesicular stranding - continue rocephin - follow up cultures  Gastroparesis - Patient  had some nausea and vomiting prior to admission consistent with prior gastroparesis symptoms.  Some reported diarrhea but not abnormal for her - No abdominal pain - Okay to check C. difficile and GI pathogen panel that were ordered on admission but low suspicion for stool infection -DC Flagyl given low suspicion for abdominal infection  Hypotension - due to GI losses - home meds on hold  AKI (acute kidney injury) ([place]) - baseline creatinine ~ 0.9-1 - patient presents with increase in creat >0.3 mg/dL above baseline, creat increase >1.5x baseline presumed to have occurred within past 7 days PTA - creat 1.37 on admission - etiology pre-renal from poor intake and GI losses - advance diet as nausea has improved  - BMP in am  Hypothyroidism - TSH elevated to 7.46 with free T4 1.41 -Recent TSH just checked outpatient at [facility] on 02/26/2022, 1.729 -TSH elevation this hospitalization likely due to some reactive response especially with elevated free T4 - no changes to regimen for now - repeat TSH and FT4 after acute illness resolved outpatient    Near syncope and fall-resolved as of 02/28/2022 - due to dizziness and GI losses - symptomatically improved  Diabetic ulcer of left foot ([place]) - chronic ulcer; not infected appearing - okay for outpatient follow up with podiatry  Chronic diastolic CHF (congestive heart failure) ([place]) - no s/s volume overload  - BB on hold  Essential (primary) hypertension Holding amlodipine and metoprolol due to hypotension  COPD (chronic obstructive pulmonary disease) ([place]) - continue current regimen - no s/s exac  PAF (paroxysmal atrial fibrillation) (HCC) - Holding metoprolol due to BP and  HR - Continue apixaban  Rheumatoid arthritis (HCC) Stable Continue Plaquenil Continue home Percocet as needed  Bipolar disorder, unspecified ([place]) Continue quetiapine and Prozac  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[employer]) No acute  issues suspected  Allergy to multiple antibiotics  patient has hives with Keflex, rash with doxycycline, palpitations with sulfa was, tongue swelling with clindamycin and rash with meropenem  Chronic respiratory failure with hypoxia ([place]) - on 3L chronically  Morbid obesity with BMI of 50.0-59.9, adult ([place]) Complicating factor to overall prognosis and care   Old records reviewed in assessment of this patient  Antimicrobials: Vanc 11/12 x 1 Flagyl 11/12 x 1 Rocephin 11/12 >> current   DVT prophylaxis:   apixaban (ELIQUIS) tablet 5 mg   Code Status:   Code Status: Full Code  Mobility Assessment (last 72 hours)     Mobility Assessment     Row Name 02/28/22 0500           Does patient have an order for bedrest or is patient medically unstable No - Continue assessment       What is the highest level of mobility based on the progressive mobility assessment? Level 2 ([place]) - Balance while sitting on edge of bed and cannot stand       Is the above level different from baseline mobility prior to current illness? Yes - Recommend PT order                Barriers to discharge: none Disposition Plan:  Home Monday Status is: Inpt  Objective: Blood pressure 108/64, pulse (!) 56, temperature 98.8 F (37.1 C), temperature source Oral, resp. rate 16, height 5\' 6"  (1.676 m), weight (!) 141.1 kg, last menstrual period 04/20/2001, SpO2 99 %.  Examination:  Physical Exam Constitutional:      Appearance: Normal appearance.  HENT:     Head: Normocephalic and atraumatic.     Mouth/Throat:     Mouth: Mucous membranes are moist.  Eyes:     Extraocular Movements: Extraocular movements intact.  Cardiovascular:     Rate and Rhythm: Normal rate and regular rhythm.  Pulmonary:     Effort: Pulmonary effort is normal.     Breath sounds: Normal breath sounds.  Abdominal:     General: Bowel sounds are normal. There is no distension.     Palpations: Abdomen is soft.      Tenderness: There is no abdominal tenderness.  Musculoskeletal:     Cervical back: Normal range of motion and neck supple.     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 Left plantar aspect noted with medial aspect indurated ulcer with no erythema, calor, dolor. She has baseline edema unchanged. No palpable foreign object along plantar surface  Skin:    General: Skin is warm and dry.  Neurological:     General: No focal deficit present.     Mental Status: She is alert.  Psychiatric:        Mood and Affect: Mood normal.        Behavior: Behavior normal.      Consultants:    Procedures:    Data Reviewed: Results for orders placed or performed during the hospital encounter of 02/27/22 (from the past 24 hour(s))  Basic metabolic panel     Status: Abnormal   Collection Time: 02/27/22 10:35 PM  Result Value Ref Range   Sodium 140 135 - 145 mmol/L   Potassium 3.7 3.5 -  5.1 mmol/L   Chloride 108 98 - 111 mmol/L   CO2 24 22 - 32 mmol/L   Glucose, Bld 195 (H) 70 - 99 mg/dL   BUN 19 6 - 20 mg/dL   Creatinine, Ser 2.99 (H) 0.44 - 1.00 mg/dL   Calcium 9.5 8.9 - 37.1 mg/dL   GFR, Estimated 44 (L) >60 mL/min   Anion gap 8 5 - 15  CBC     Status: Abnormal   Collection Time: 02/27/22 10:35 PM  Result Value Ref Range   WBC 10.5 4.0 - 10.5 K/uL   RBC 5.23 (H) 3.87 - 5.11 MIL/uL   Hemoglobin 11.0 (L) 12.0 - 15.0 g/dL   HCT 69.6 78.9 - 38.1 %   MCV 70.4 (L) 80.0 - 100.0 fL   MCH 21.0 (L) 26.0 - 34.0 pg   MCHC 29.9 (L) 30.0 - 36.0 g/dL   RDW 01.7 (H) 51.0 - 25.8 %   Platelets 318 150 - 400 K/uL   nRBC 0.2 0.0 - 0.2 %  Troponin I (High Sensitivity)     Status: None   Collection Time: 02/27/22 10:35 PM  Result Value Ref Range   Troponin I (High Sensitivity) 6 <18 ng/L  TSH     Status: Abnormal   Collection Time: 02/27/22 10:35 PM  Result Value Ref Range   TSH 7.462 (H) 0.350 - 4.500 uIU/mL  T4, free     Status: Abnormal    Collection Time: 02/27/22 10:35 PM  Result Value Ref Range   Free T4 1.41 (H) 0.61 - 1.12 ng/dL  Lactic acid, plasma     Status: Abnormal   Collection Time: 02/27/22 11:55 PM  Result Value Ref Range   Lactic Acid, Venous 2.8 (HH) 0.5 - 1.9 mmol/L  Urinalysis, Routine w reflex microscopic     Status: Abnormal   Collection Time: 02/28/22  1:40 AM  Result Value Ref Range   Color, Urine AMBER (A) YELLOW   APPearance TURBID (A) CLEAR   Specific Gravity, Urine 1.037 (H) 1.005 - 1.030   pH 7.0 5.0 - 8.0   Glucose, UA NEGATIVE NEGATIVE mg/dL   Hgb urine dipstick NEGATIVE NEGATIVE   Bilirubin Urine NEGATIVE NEGATIVE   Ketones, ur NEGATIVE NEGATIVE mg/dL   Protein, ur >=527 (A) NEGATIVE mg/dL   Nitrite NEGATIVE NEGATIVE   Leukocytes,Ua MODERATE (A) NEGATIVE   RBC / HPF 11-20 0 - 5 RBC/hpf   WBC, UA >50 (H) 0 - 5 WBC/hpf   Bacteria, UA MANY (A) NONE SEEN   Squamous Epithelial / LPF NONE SEEN 0 - 5   WBC Clumps PRESENT    Mucus PRESENT   Lactic acid, plasma     Status: None   Collection Time: 02/28/22  1:40 AM  Result Value Ref Range   Lactic Acid, Venous 1.3 0.5 - 1.9 mmol/L  Troponin I (High Sensitivity)     Status: None   Collection Time: 02/28/22  1:40 AM  Result Value Ref Range   Troponin I (High Sensitivity) 5 <18 ng/L  Glucose, capillary     Status: Abnormal   Collection Time: 02/28/22  3:20 AM  Result Value Ref Range   Glucose-Capillary 143 (H) 70 - 99 mg/dL  Glucose, capillary     Status: Abnormal   Collection Time: 02/28/22  8:31 AM  Result Value Ref Range   Glucose-Capillary 130 (H) 70 - 99 mg/dL  Glucose, capillary     Status: Abnormal   Collection Time: 02/28/22 11:51 AM  Result Value  Ref Range   Glucose-Capillary 109 (H) 70 - 99 mg/dL    I have Reviewed nursing notes, Vitals, and Lab results since pt's last encounter. Pertinent lab results : see above I have ordered test including BMP, CBC, Mg I have reviewed the last note from staff over past 24 hours I have  discussed pt's care plan and test results with nursing staff, case manager  Time spent: Greater than 50% of the 55 minute visit was spent in counseling/coordination of care for the patient as laid out in the A&P.    LOS: 0 days   Lewie Chamber, MD [facility] 02/28/2022, 12:27 PM

## 2022-02-28 NOTE — Assessment & Plan Note (Signed)
Stable Continue Plaquenil Continue home Percocet as needed

## 2022-02-28 NOTE — Assessment & Plan Note (Addendum)
-   severe sepsis ruled out - UA noted with mod LE, neg nitrite, many bacteria, >50 WBC - CT shows thickened bladder wall and perivesicular stranding -Treated with Rocephin during hospitalization.  Discharged on Omnicef - Urine culture growing Proteus, sensitivities pending at discharge and will be followed up

## 2022-02-28 NOTE — Assessment & Plan Note (Addendum)
-   Holding metoprolol due to BP and HR - Continue apixaban

## 2022-02-28 NOTE — Hospital Course (Addendum)
[name] [name] is a 61 yo female with PMH DMII (better controlled lately), diabetic neuropathy, gastroparesis, diabetic foot infections, osteomyelitis, morbid obesity, HTN, diastolic CHF, PAF, bipolar, RA, PVD, COPD, chronic hypoxic respiratory failure on 3 L chronically.  She presented to the hospital with complaints of dizziness and recent poor intake.  She had associated nausea and vomiting consistent with her gastroparesis.  She also had mentioned some recent diarrhea, no abdominal pain. Urine has also been burning with voiding recently. She underwent work-up on admission.  UA was notable for moderate LE, negative nitrite, greater than 50 WBC, many bacteria. CT abdomen/pelvis was obtained which showed mild circumferential bladder wall thickening and perivesicular inflammatory stranding concerning for underlying infection. She also has a chronic left plantar ulcer which was draining blood recently.  X-ray showed no significant soft tissue edema.  There was a small 3 mm linear radiopaque object noted in the superficial soft tissue of the forefoot.  There were no signs of infection involving plantar surface of her foot.  She did initially see of a dose of vancomycin in the ER.  She was continued on Rocephin on admission for UTI treatment.

## 2022-02-28 NOTE — Assessment & Plan Note (Deleted)
Sepsis criteria includes  hypotension, AKI, leukocytosis, UTI with suspected gastroenteritis/acute diarrheal illness Continue sepsis fluids and monitor for fluid overload given CHF history Suspected etiologies include UTI, gastroenteritis, treatment as outlined below

## 2022-02-28 NOTE — Assessment & Plan Note (Addendum)
-   Patient had some nausea and vomiting prior to admission consistent with prior gastroparesis symptoms.  Some reported diarrhea but not abnormal for her - No abdominal pain - Okay to check C. difficile and GI pathogen panel that were ordered on admission but low suspicion for stool infection; no further diarrhea after admission and no studies obtained -DC Flagyl given low suspicion for abdominal infection

## 2022-02-28 NOTE — Assessment & Plan Note (Addendum)
-   chronic ulcer; not infected appearing - okay for outpatient follow up with podiatry

## 2022-02-28 NOTE — Assessment & Plan Note (Addendum)
-   due to dizziness and GI losses - symptomatically improved

## 2022-02-28 NOTE — Assessment & Plan Note (Signed)
Holding amlodipine and metoprolol due to hypotension

## 2022-02-28 NOTE — Assessment & Plan Note (Addendum)
-   TSH elevated to 7.46 with free T4 1.41 -Recent TSH just checked outpatient at [facility] on 02/26/2022, 1.729 -TSH elevation this hospitalization likely due to some reactive response especially with elevated free T4 - no changes to regimen for now - repeat TSH and FT4 after acute illness resolved outpatient

## 2022-02-28 NOTE — Assessment & Plan Note (Addendum)
-   no s/s volume overload

## 2022-02-28 NOTE — Assessment & Plan Note (Addendum)
-   continue current regimen - no s/s exac

## 2022-02-28 NOTE — Assessment & Plan Note (Signed)
patient has hives with Keflex, rash with doxycycline, palpitations with sulfa was, tongue swelling with clindamycin and rash with meropenem

## 2022-02-28 NOTE — Assessment & Plan Note (Signed)
Complicating factor to overall prognosis and care 

## 2022-02-28 NOTE — Assessment & Plan Note (Signed)
No acute issues suspected 

## 2022-02-28 NOTE — Assessment & Plan Note (Signed)
-   on 3L chronically

## 2022-02-28 NOTE — Assessment & Plan Note (Addendum)
-   baseline creatinine ~ 0.9-1 - patient presents with increase in creat >0.3 mg/dL above baseline, creat increase >1.5x baseline presumed to have occurred within past 7 days PTA - creat 1.37 on admission - etiology pre-renal from poor intake and GI losses -Renal function improved to 1.24 at time of discharge

## 2022-03-01 DIAGNOSIS — K3184 Gastroparesis: Secondary | ICD-10-CM

## 2022-03-01 DIAGNOSIS — R55 Syncope and collapse: Secondary | ICD-10-CM

## 2022-03-01 DIAGNOSIS — N3000 Acute cystitis without hematuria: Principal | ICD-10-CM

## 2022-03-01 DIAGNOSIS — N179 Acute kidney failure, unspecified: Secondary | ICD-10-CM

## 2022-03-01 DIAGNOSIS — I959 Hypotension, unspecified: Secondary | ICD-10-CM

## 2022-03-01 DIAGNOSIS — E039 Hypothyroidism, unspecified: Secondary | ICD-10-CM

## 2022-03-01 LAB — CBC WITH DIFFERENTIAL/PLATELET
Abs Immature Granulocytes: 0.03 10*3/uL (ref 0.00–0.07)
Basophils Absolute: 0 10*3/uL (ref 0.0–0.1)
Basophils Relative: 1 %
Eosinophils Absolute: 0 10*3/uL (ref 0.0–0.5)
Eosinophils Relative: 0 %
HCT: 31.6 % — ABNORMAL LOW (ref 36.0–46.0)
Hemoglobin: 9.4 g/dL — ABNORMAL LOW (ref 12.0–15.0)
Immature Granulocytes: 0 %
Lymphocytes Relative: 21 %
Lymphs Abs: 1.5 10*3/uL (ref 0.7–4.0)
MCH: 21.2 pg — ABNORMAL LOW (ref 26.0–34.0)
MCHC: 29.7 g/dL — ABNORMAL LOW (ref 30.0–36.0)
MCV: 71.2 fL — ABNORMAL LOW (ref 80.0–100.0)
Monocytes Absolute: 0.7 10*3/uL (ref 0.1–1.0)
Monocytes Relative: 10 %
Neutro Abs: 4.8 10*3/uL (ref 1.7–7.7)
Neutrophils Relative %: 68 %
Platelets: 205 10*3/uL (ref 150–400)
RBC: 4.44 MIL/uL (ref 3.87–5.11)
RDW: 17.4 % — ABNORMAL HIGH (ref 11.5–15.5)
WBC: 7.1 10*3/uL (ref 4.0–10.5)
nRBC: 0 % (ref 0.0–0.2)

## 2022-03-01 LAB — MAGNESIUM: Magnesium: 1.9 mg/dL (ref 1.7–2.4)

## 2022-03-01 LAB — BASIC METABOLIC PANEL
Anion gap: 5 (ref 5–15)
BUN: 21 mg/dL — ABNORMAL HIGH (ref 6–20)
CO2: 22 mmol/L (ref 22–32)
Calcium: 8.7 mg/dL — ABNORMAL LOW (ref 8.9–10.3)
Chloride: 111 mmol/L (ref 98–111)
Creatinine, Ser: 1.24 mg/dL — ABNORMAL HIGH (ref 0.44–1.00)
GFR, Estimated: 50 mL/min — ABNORMAL LOW (ref >60)
Glucose, Bld: 127 mg/dL — ABNORMAL HIGH (ref 70–99)
Potassium: 4.2 mmol/L (ref 3.5–5.1)
Sodium: 138 mmol/L (ref 135–145)

## 2022-03-01 LAB — GLUCOSE, CAPILLARY: Glucose-Capillary: 126 mg/dL — ABNORMAL HIGH (ref 70–99)

## 2022-03-01 MED ORDER — CEFDINIR 300 MG PO CAPS: 300.0000 mg | ORAL_CAPSULE | Freq: Two times a day (BID) | ORAL | 0 refills | Status: AC

## 2022-03-01 NOTE — TOC Initial Note (Signed)
Transition of Care [facility]) - Initial/Assessment Note    Patient Details  Name: [name] [name] MRN: [id number] Date of Birth: 05-Mar-1961  Transition of [facility]) CM/SW Contact:    Colin Broach, LCSW Phone Number: 03/01/2022, 10:50 AM  Clinical Narrative:                 Spoke with pt and confirmed her PCP on file.  Pt uses [facility] in [place].  He aunt will pick her up at d/c.  Pt has no barriers to getting medications or transportation to/from appts.  Expected Discharge Plan: Home/Self Care     Patient Goals and CMS Choice   CMS Medicare.gov Compare Post Acute Care list provided to:: Patient Choice offered to / list presented to : Patient  Expected Discharge Plan and Services Expected Discharge Plan: Home/Self Care       Living arrangements for the past 2 months: Single Family Home Expected Discharge Date: 03/01/22                                    Prior Living Arrangements/Services Living arrangements for the past 2 months: Single Family Home Lives with:: Self   Do you feel safe going back to the place where you live?: Yes               Activities of Daily Living Home Assistive Devices/Equipment: Built-in shower seat, Grab bars in shower, Grab bars around toilet, Oxygen, Shower chair without back ADL Screening (condition at time of admission) Patient's cognitive ability adequate to safely complete daily activities?: Yes Is the patient deaf or have difficulty hearing?: No Does the patient have difficulty seeing, even when wearing glasses/contacts?: No Does the patient have difficulty concentrating, remembering, or making decisions?: No Patient able to express need for assistance with ADLs?: Yes Does the patient have difficulty dressing or bathing?: No Independently performs ADLs?: Yes (appropriate for developmental age) Does the patient have difficulty walking or climbing stairs?: Yes Weakness of Legs: Both Weakness of Arms/Hands:  Both  Permission Sought/Granted                  Emotional Assessment       Orientation: : Oriented to Self, Oriented to Place, Oriented to  Time, Oriented to Situation Alcohol / Substance Use: Not Applicable Psych Involvement: No (comment)  Admission diagnosis:  Cellulitis of right lower extremity [L03.115] Severe sepsis (HCC) [A41.9, R65.20] Sepsis, due to unspecified organism, unspecified whether acute organ dysfunction present [facility]) [A41.9] Patient Active Problem List   Diagnosis Date Noted   Gastroparesis 02/28/2022   Allergy to multiple antibiotics 02/28/2022   Lactic acidosis 01/05/2022   Sepsis (HCC) 01/04/2022   Hypotension 01/04/2022   Acute cystitis 12/27/2021   Morbid obesity (HCC)    Depression with anxiety    PAF (paroxysmal atrial fibrillation) (HCC)    Chronic respiratory failure with hypoxia (HCC) 02/07/2020   Paroxysmal A-fib (HCC)    Morbid obesity with BMI of 50.0-59.9, adult (HCC) 07/08/2019   Controlled type 2 diabetes mellitus without complication, without long-term current use of insulin (HCC) 04/06/2019   CKD (chronic kidney disease), stage III (HCC) 04/06/2019   HLD (hyperlipidemia) 04/06/2019   AKI (acute kidney injury) (HCC) 02/12/2019   Diabetic ulcer of left foot (HCC) 02/14/2018   Chronic pain syndrome 03/31/2016   Rheumatoid arthritis (HCC) 12/25/2015   Osteoarthritis, multiple sites 12/24/2015   Presence of  functional implant (Bladder stimulator/[employer]) 12/23/2015   Long term current use of opiate analgesic 12/23/2015   Long term prescription opiate use 12/23/2015   Diabetic peripheral neuropathy (HCC) 12/23/2015   Chronic diastolic CHF (congestive heart failure) (HCC) 07/03/2014   COPD (chronic obstructive pulmonary disease) (HCC) 01/03/2014   Bipolar disorder, unspecified (HCC) 01/03/2014   Borderline personality disorder (HCC) 01/06/2012   Hypothyroidism 06/28/2010   Rheumatoid arthritis involving multiple sites with positive  rheumatoid factor (HCC) 06/28/2010   Essential (primary) hypertension 01/27/2005   Major depressive disorder, recurrent episode, moderate (HCC) 06/03/2004   PCP:  Etheleen Nicks, NP Pharmacy:   [facility] [address] [place] [facility] [address] [place] [address] Phone: [phone] Fax: [phone]  [facility] - [place], [place] - [address] [place] [address] Phone: [phone] Fax: [phone]     Social Determinants of Health (SDOH) Interventions    Readmission Risk Interventions    01/05/2022   11:09 AM 07/17/2019   12:16 PM 07/09/2019    2:43 PM  Readmission Risk Prevention Plan  Transportation Screening Complete Complete Complete  PCP or Specialist Appt within 3-5 Days Complete    Social Work Consult for Recovery Care Planning/Counseling Complete    Palliative Care Screening Not Applicable    Medication Review [employer]) Complete Complete Complete  PCP or Specialist appointment within 3-5 days of discharge  Complete Complete  HRI or Home Care Consult  Complete Complete  Palliative Care Screening  Not Applicable   Skilled Nursing Facility  Not Applicable

## 2022-03-01 NOTE — Discharge Summary (Signed)
Physician Discharge Summary   [name] [name]:[id number] DOB: 12-10-1960 DOA: 02/27/2022  PCP: [name] Nicks, NP  Admit date: 02/27/2022 Discharge date: 03/01/2022  Barriers to discharge: none  Admitted From: Home Disposition:  Home Discharging physician: Lewie Chamber, MD  Recommendations for Outpatient Follow-up:  Repeat TSH and FT4 in ~4 weeks Follow up with podiatry  Home Health:  Equipment/Devices:   Discharge Condition: stable CODE STATUS: Full Diet recommendation:  Diet Orders (From admission, onward)     Start     Ordered   03/01/22 0000  Diet Carb Modified        03/01/22 0926   02/28/22 0920  Diet Carb Modified Fluid consistency: Thin; Room service appropriate? Yes  Diet effective now       Question Answer Comment  Diet-HS Snack? Nothing   Calorie Level Medium 1600-2000   Fluid consistency: Thin   Room service appropriate? Yes      02/28/22 0919            Hospital Course: [name] is a 61 yo female with PMH DMII (better controlled lately), diabetic neuropathy, gastroparesis, diabetic foot infections, osteomyelitis, morbid obesity, HTN, diastolic CHF, PAF, bipolar, RA, PVD, COPD, chronic hypoxic respiratory failure on 3 L chronically.  She presented to the hospital with complaints of dizziness and recent poor intake.  She had associated nausea and vomiting consistent with her gastroparesis.  She also had mentioned some recent diarrhea, no abdominal pain. Urine has also been burning with voiding recently. She underwent work-up on admission.  UA was notable for moderate LE, negative nitrite, greater than 50 WBC, many bacteria. CT abdomen/pelvis was obtained which showed mild circumferential bladder wall thickening and perivesicular inflammatory stranding concerning for underlying infection. She also has a chronic left plantar ulcer which was draining blood recently.  X-ray showed no significant soft tissue edema.  There was a small 3 mm linear  radiopaque object noted in the superficial soft tissue of the forefoot.  There were no signs of infection involving plantar surface of her foot.  She did initially see of a dose of vancomycin in the ER.  She was continued on Rocephin on admission for UTI treatment.  Assessment and Plan: * Acute cystitis - severe sepsis ruled out - UA noted with mod LE, neg nitrite, many bacteria, >50 WBC - CT shows thickened bladder wall and perivesicular stranding -Treated with Rocephin during hospitalization.  Discharged on Omnicef - Urine culture growing Proteus, sensitivities pending at discharge and will be followed up  Gastroparesis - Patient had some nausea and vomiting prior to admission consistent with prior gastroparesis symptoms.  Some reported diarrhea but not abnormal for her - No abdominal pain - Okay to check C. difficile and GI pathogen panel that were ordered on admission but low suspicion for stool infection; no further diarrhea after admission and no studies obtained -DC Flagyl given low suspicion for abdominal infection  AKI (acute kidney injury) (HCC) - baseline creatinine ~ 0.9-1 - patient presents with increase in creat >0.3 mg/dL above baseline, creat increase >1.5x baseline presumed to have occurred within past 7 days PTA - creat 1.37 on admission - etiology pre-renal from poor intake and GI losses -Renal function improved to 1.24 at time of discharge  Hypothyroidism - TSH elevated to 7.46 with free T4 1.41 -Recent TSH just checked outpatient at [facility] on 02/26/2022, 1.729 -TSH elevation this hospitalization likely due to some reactive response especially with elevated free T4 - no changes to regimen for now -  repeat TSH and FT4 after acute illness resolved outpatient    Hypotension-resolved as of 03/01/2022 - BP returned to normal prior to discharge.  Okay to resume home regimen  Near syncope and fall-resolved as of 02/28/2022 - due to dizziness and GI losses -  symptomatically improved  Diabetic ulcer of left foot (HCC) - chronic ulcer; not infected appearing - okay for outpatient follow up with podiatry  Chronic diastolic CHF (congestive heart failure) (HCC) - no s/s volume overload  Essential (primary) hypertension - Home regimen resumed at discharge  COPD (chronic obstructive pulmonary disease) (HCC) - continue current regimen - no s/s exac  PAF (paroxysmal atrial fibrillation) (HCC) - Continue apixaban -Lopressor resumed at discharge  Rheumatoid arthritis (HCC) Stable Continue Plaquenil Continue home Percocet as needed  Bipolar disorder, unspecified (HCC) Continue quetiapine and Prozac  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[employer]) No acute issues suspected  Allergy to multiple antibiotics  patient has hives with Keflex, rash with doxycycline, palpitations with sulfa was, tongue swelling with clindamycin and rash with meropenem  Chronic respiratory failure with hypoxia (HCC) - on 3L chronically  Morbid obesity with BMI of 50.0-59.9, adult (HCC) Complicating factor to overall prognosis and care   The patient's chronic medical conditions were treated accordingly per the patient's home medication regimen except as noted.  On day of discharge, patient was felt deemed stable for discharge. Patient/family member advised to call PCP or come back to ER if needed.   Principal Diagnosis: Acute cystitis  Discharge Diagnoses: Active Hospital Problems   Diagnosis Date Noted   Acute cystitis 12/27/2021    Priority: 1.   Gastroparesis 02/28/2022    Priority: 1.   AKI (acute kidney injury) (HCC) 02/12/2019    Priority: 2.   Hypothyroidism 06/28/2010    Priority: 2.   Diabetic ulcer of left foot (HCC) 02/14/2018    Priority: 3.   Chronic diastolic CHF (congestive heart failure) (HCC) 07/03/2014    Priority: 6.   COPD (chronic obstructive pulmonary disease) (HCC) 01/03/2014    Priority: 7.   Essential  (primary) hypertension 01/27/2005    Priority: 7.   PAF (paroxysmal atrial fibrillation) (HCC)     Priority: 9.   Rheumatoid arthritis (HCC) 12/25/2015    Priority: 13.   Bipolar disorder, unspecified (HCC) 01/03/2014    Priority: 13.   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[employer]) 12/23/2015    Priority: 15.   Allergy to multiple antibiotics 02/28/2022   Chronic respiratory failure with hypoxia (HCC) 02/07/2020   Morbid obesity with BMI of 50.0-59.9, adult [facility]) 07/08/2019    Resolved Hospital Problems   Diagnosis Date Noted Date Resolved   Near syncope and fall 01/04/2022 02/28/2022    Priority: 2.   Hypotension 01/04/2022 03/01/2022    Priority: 2.   Anxiety and depression 01/06/2012 12/27/2021     Discharge Instructions     Diet Carb Modified   Complete by: As directed    Increase activity slowly   Complete by: As directed       Allergies as of 03/01/2022       Reactions   Cephalexin Hives   Codeine Palpitations, Nausea Only, Nausea And Vomiting, Rash, Shortness Of Breath   "makes heart fly, she gets flushed and passes out"   Doxycycline Rash   Propoxyphene Rash, Shortness Of Breath   Increase heart rate   Sulfa Antibiotics Palpitations, Nausea Only, Shortness Of Breath, Hives   "makes heart fly, she gets flushed and passes out"   Clindamycin  Rash   Tongue swelling, oral sores, and Mouth rash   Lovenox [enoxaparin Sodium] Hives   Hydrocodone Nausea And Vomiting   Hear racing & breaks out into a cold sweat.   Meropenem Rash   Erythematous, hot, pruritic rash over arms, chest, back, abdomen, and face occurred at the end of meropenem infusion on 02/22/18        Medication List     TAKE these medications    acetaminophen 325 MG tablet Commonly known as: TYLENOL Take 2 tablets (650 mg total) by mouth every 6 (six) hours as needed for mild pain or moderate pain (or Fever >/= 101).   amLODipine 10 MG tablet Commonly known as: NORVASC Take  10 mg by mouth daily.   Anoro Ellipta 62.5-25 MCG/ACT Aepb Generic drug: umeclidinium-vilanterol 1 puff daily.   ARIPiprazole 5 MG tablet Commonly known as: ABILIFY Take 5 mg by mouth daily.   ascorbic acid 500 MG tablet Commonly known as: VITAMIN C Take 1 tablet (500 mg total) by mouth daily.   atorvastatin 80 MG tablet Commonly known as: LIPITOR Take 80 mg by mouth daily.   Breztri Aerosphere 160-9-4.8 MCG/ACT Aero Generic drug: Budeson-Glycopyrrol-Formoterol Inhale 2 puffs into the lungs in the morning and at bedtime.   busPIRone 10 MG tablet Commonly known as: BUSPAR Take 1 tablet (10 mg total) by mouth 2 (two) times daily.   Butrans 10 MCG/HR Ptwk Generic drug: buprenorphine PLACE 1 PATCH ONTO THE SKIN ONCE A WEEK FOR 28 DAYS.   cefdinir 300 MG capsule Commonly known as: OMNICEF Take 1 capsule (300 mg total) by mouth 2 (two) times daily for 3 days. Start taking on: March 02, 2022   Eliquis 5 MG Tabs tablet Generic drug: apixaban TAKE 1 TABLET BY MOUTH TWICE DAILY What changed: how much to take   famotidine 20 MG tablet Commonly known as: PEPCID Take 1 tablet (20 mg total) by mouth daily.   FLUoxetine 40 MG capsule Commonly known as: PROzac Take 1 capsule (40 mg total) by mouth daily.   folic acid 1 MG tablet Commonly known as: FOLVITE Take 1 mg by mouth daily.   GNP Calcium Citrate+D Maximum 315-6.25 MG-MCG Tabs Generic drug: Calcium Citrate-Vitamin D3 Take 1 tablet by mouth daily.   hydroxychloroquine 200 MG tablet Commonly known as: PLAQUENIL Take 200 mg by mouth 2 (two) times daily.   levothyroxine 88 MCG tablet Commonly known as: SYNTHROID Take 88 mcg by mouth daily before breakfast.   metFORMIN 500 MG 24 hr tablet Commonly known as: GLUCOPHAGE-XR Take 500 mg by mouth daily.   metoCLOPramide 5 MG/5ML solution Commonly known as: REGLAN Take 5 mg by mouth 4 (four) times daily -  before meals and at bedtime.   metoprolol tartrate 25 MG  tablet Commonly known as: LOPRESSOR Take 25 mg by mouth 2 (two) times daily.   oxyCODONE-acetaminophen 5-325 MG tablet Commonly known as: PERCOCET/ROXICET Take 1 tablet by mouth every 8 (eight) hours as needed for severe pain.   pramipexole 0.125 MG tablet Commonly known as: MIRAPEX Take 0.25 mg by mouth at bedtime.   pregabalin 100 MG capsule Commonly known as: Lyrica Take 1 capsule (100 mg total) by mouth 3 (three) times daily.   promethazine 12.5 MG tablet Commonly known as: PHENERGAN Take 12.5 mg by mouth.   QUEtiapine 300 MG tablet Commonly known as: SEROQUEL TAKE ONE TABLET BY MOUTH AT BEDTIME.   Tradjenta 5 MG Tabs tablet Generic drug: linagliptin Take 5 mg by mouth daily.  Vitamin D 125 MCG (5000 UT) Caps Take 1 capsule by mouth daily.   zinc sulfate 220 (50 Zn) MG capsule Take 220 mg by mouth at bedtime.   zolpidem 5 MG tablet Commonly known as: AMBIEN Take 1 tablet (5 mg total) by mouth at bedtime.        Allergies  Allergen Reactions   Cephalexin Hives   Codeine Palpitations, Nausea Only, Nausea And Vomiting, Rash and Shortness Of Breath    "makes heart fly, she gets flushed and passes out"   Doxycycline Rash   Propoxyphene Rash and Shortness Of Breath    Increase heart rate   Sulfa Antibiotics Palpitations, Nausea Only, Shortness Of Breath and Hives    "makes heart fly, she gets flushed and passes out"   Clindamycin Rash    Tongue swelling, oral sores, and Mouth rash   Lovenox [Enoxaparin Sodium] Hives   Hydrocodone Nausea And Vomiting    Hear racing & breaks out into a cold sweat.   Meropenem Rash    Erythematous, hot, pruritic rash over arms, chest, back, abdomen, and face occurred at the end of meropenem infusion on 02/22/18    Consultations:   Procedures:   Discharge Exam: BP 135/63 (BP Location: Right Arm)   Pulse 67   Temp 98.3 F (36.8 C) (Oral)   Resp 16   Ht  (1.676 m)   Wt (!) 141.1 kg   LMP 04/20/2001   SpO2 98%    BMI 50.21 kg/m  Physical Exam Constitutional:      Appearance: Normal appearance.  HENT:     Head: Normocephalic and atraumatic.     Mouth/Throat:     Mouth: Mucous membranes are moist.  Eyes:     Extraocular Movements: Extraocular movements intact.  Cardiovascular:     Rate and Rhythm: Normal rate and regular rhythm.  Pulmonary:     Effort: Pulmonary effort is normal.     Breath sounds: Normal breath sounds.  Abdominal:     General: Bowel sounds are normal. There is no distension.     Palpations: Abdomen is soft.     Tenderness: There is no abdominal tenderness.  Musculoskeletal:     Cervical back: Normal range of motion and neck supple.     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 Left plantar aspect noted with medial aspect indurated ulcer with no erythema, calor, dolor. She has baseline edema unchanged. No palpable foreign object along plantar surface  Skin:    General: Skin is warm and dry.  Neurological:     General: No focal deficit present.     Mental Status: She is alert.  Psychiatric:        Mood and Affect: Mood normal.        Behavior: Behavior normal.      The results of significant diagnostics from this hospitalization (including imaging, microbiology, ancillary and laboratory) are listed below for reference.   Microbiology: Recent Results (from the past 240 hour(s))  Urine Culture     Status: Abnormal (Preliminary result)   Collection Time: 02/28/22  1:40 AM   Specimen: In/Out Cath Urine  Result Value Ref Range Status   Specimen Description   Final    IN/OUT CATH URINE Performed at [facility], [address] [place], [place] [address]    Special Requests   Final    NONE Performed at [facility], [address] [place], [place]  [address]    Culture (A)  Final    >=100,000 COLONIES/mL PROTEUS MIRABILIS SUSCEPTIBILITIES TO FOLLOW Performed at [facility],  [address] [place], [place] [address]    Report Status PENDING  Incomplete     Labs: BNP (last 3 results) Recent Labs    12/27/21 0940 01/14/22 1800  BNP 75.2 63.4   Basic Metabolic Panel: Recent Labs  Lab 02/27/22 2235 03/01/22 0426  NA 140 138  K 3.7 4.2  CL 108 111  CO2 24 22  GLUCOSE 195* 127*  BUN 19 21*  CREATININE 1.37* 1.24*  CALCIUM 9.5 8.7*  MG  --  1.9   Liver Function Tests: No results for input(s): "AST", "ALT", "ALKPHOS", "BILITOT", "PROT", "ALBUMIN" in the last 168 hours. No results for input(s): "LIPASE", "AMYLASE" in the last 168 hours. No results for input(s): "AMMONIA" in the last 168 hours. CBC: Recent Labs  Lab 02/27/22 2235 03/01/22 0426  WBC 10.5 7.1  NEUTROABS  --  4.8  HGB 11.0* 9.4*  HCT 36.8 31.6*  MCV 70.4* 71.2*  PLT 318 205   Cardiac Enzymes: No results for input(s): "CKTOTAL", "CKMB", "CKMBINDEX", "TROPONINI" in the last 168 hours. BNP: Invalid input(s): "POCBNP" CBG: Recent Labs  Lab 02/28/22 0831 02/28/22 1151 02/28/22 1709 02/28/22 2219 03/01/22 0849  GLUCAP 130* 109* 105* 155* 126*   D-Dimer No results for input(s): "DDIMER" in the last 72 hours. Hgb A1c No results for input(s): "HGBA1C" in the last 72 hours. Lipid Profile No results for input(s): "CHOL", "HDL", "LDLCALC", "TRIG", "CHOLHDL", "LDLDIRECT" in the last 72 hours. Thyroid function studies Recent Labs    02/27/22 2235  TSH 7.462*   Anemia work up No results for input(s): "VITAMINB12", "FOLATE", "FERRITIN", "TIBC", "IRON", "RETICCTPCT" in the last 72 hours. Urinalysis    Component Value Date/Time   COLORURINE AMBER (A) 02/28/2022 0140   APPEARANCEUR TURBID (A) 02/28/2022 0140   APPEARANCEUR Clear 11/30/2013 1010   LABSPEC 1.037 (H) 02/28/2022 0140   LABSPEC 1.015 11/30/2013 1010   PHURINE 7.0 02/28/2022 0140   GLUCOSEU NEGATIVE 02/28/2022 0140   GLUCOSEU >=500 11/30/2013 1010   HGBUR NEGATIVE 02/28/2022 0140   BILIRUBINUR NEGATIVE  02/28/2022 0140   BILIRUBINUR Negative 11/30/2013 1010   KETONESUR NEGATIVE 02/28/2022 0140   PROTEINUR >=300 (A) 02/28/2022 0140   UROBILINOGEN 0.2 03/14/2007 1000   NITRITE NEGATIVE 02/28/2022 0140   LEUKOCYTESUR MODERATE (A) 02/28/2022 0140   LEUKOCYTESUR Negative 11/30/2013 1010   Sepsis Labs Recent Labs  Lab 02/27/22 2235 03/01/22 0426  WBC 10.5 7.1   Microbiology Recent Results (from the past 240 hour(s))  Urine Culture     Status: Abnormal (Preliminary result)   Collection Time: 02/28/22  1:40 AM   Specimen: In/Out Cath Urine  Result Value Ref Range Status   Specimen Description   Final    IN/OUT CATH URINE Performed at [facility], [address], [place], [place] [address]    Special Requests   Final    NONE Performed at [facility], [address], [place], [place] [address]    Culture (A)  Final    >=100,000 COLONIES/mL PROTEUS MIRABILIS SUSCEPTIBILITIES TO FOLLOW Performed at [facility], [address], [place], [place] [address]    Report Status PENDING  Incomplete    Procedures/Studies: CT Abdomen Pelvis W Contrast  Result Date: 02/28/2022 CLINICAL DATA:  Nausea/vomiting Abdominal pain, acute, nonlocalized EXAM: CT ABDOMEN AND PELVIS WITH CONTRAST TECHNIQUE: Multidetector CT imaging of the  abdomen and pelvis was performed using the standard protocol following bolus administration of intravenous contrast. RADIATION DOSE REDUCTION: This exam was performed according to the departmental dose-optimization program which includes automated exposure control, adjustment of the mA and/or kV according to patient size and/or use of iterative reconstruction technique. CONTRAST:  77mL OMNIPAQUE IOHEXOL 300 MG/ML  SOLN COMPARISON:  01/04/2022 FINDINGS: Lower chest: No acute abnormality.  Small hiatal hernia. Hepatobiliary: No focal liver abnormality is seen. Status post cholecystectomy. No biliary dilatation. Pancreas: Unremarkable. No pancreatic  ductal dilatation or surrounding inflammatory changes. Spleen: Normal in size without focal abnormality. Adrenals/Urinary Tract: The adrenal glands are unremarkable. The kidneys are normal in position. Mild bilateral renal cortical atrophy, stable. The kidneys are otherwise unremarkable. The bladder is partially decompressed. Despite this, there is mild circumferential bladder wall thickening and perivesicular inflammatory stranding, new since prior examination which may reflect changes of a superimposed infectious or inflammatory cystitis. Previously noted Foley catheter has been removed. Stomach/Bowel: Stomach is within normal limits. Appendix appears normal. No evidence of bowel wall thickening, distention, or inflammatory changes. Vascular/Lymphatic: Shotty bilateral external iliac lymphadenopathy is again identified and appears stable since prior examination, possibly reactive or inflammatory in nature. No additional pathologic adenopathy within the abdomen and pelvis. The abdominal vasculature is unremarkable. Reproductive: Status post hysterectomy. No adnexal masses. Other: Stable moderate right parasagittal fat and fluid containing ventral hernia. No abdominopelvic ascites. Musculoskeletal: Osseous structures are age-appropriate. No acute bone abnormality. Left sacral plexus neurostimulator is unchanged in position. IMPRESSION: 1. Mild circumferential bladder wall thickening and perivesicular inflammatory stranding, new since prior examination which may reflect changes of a superimposed infectious or inflammatory cystitis. Correlation with urinalysis and urine culture may be helpful. 2. Stable moderate right parasagittal fat and fluid containing ventral hernia. 3. Small hiatal hernia. Electronically Signed   By: Helyn Numbers M.D.   On: 02/28/2022 00:58   CT HEAD WO CONTRAST  Result Date: 02/28/2022 CLINICAL DATA:  Syncope. EXAM: CT HEAD WITHOUT CONTRAST TECHNIQUE: Contiguous axial images were obtained  from the base of the skull through the vertex without intravenous contrast. RADIATION DOSE REDUCTION: This exam was performed according to the departmental dose-optimization program which includes automated exposure control, adjustment of the mA and/or kV according to patient size and/or use of iterative reconstruction technique. COMPARISON:  Head CT dated 01/04/2022. FINDINGS: Brain: Mild diffuse cortical atrophy. The gray-white matter discrimination is preserved. There is no acute intracranial hemorrhage. No mass effect or midline shift. No extra-axial fluid collection. Vascular: No hyperdense vessel or unexpected calcification. Skull: Normal. Negative for fracture or focal lesion. Sinuses/Orbits: No acute finding. Other: None IMPRESSION: 1. No acute intracranial pathology. 2. Mild diffuse cortical atrophy. Electronically Signed   By: Elgie Collard M.D.   On: 02/28/2022 00:48   DG Foot Complete Left  Result Date: 02/28/2022 CLINICAL DATA:  Cellulitis and ulcer. EXAM: LEFT FOOT - COMPLETE 3+ VIEW COMPARISON:  Left foot radiograph dated 01/10/2020. FINDINGS: Amputation of the mid portion of the first metatarsal. There is no acute fracture or dislocation. No periosteal elevation or bone erosion to suggest acute osteomyelitis. The bones are osteopenic. There is hammertoe deformity. There is diffuse soft tissue thickening of the forefoot. A 3 mm linear radiopaque foreign object noted in the superficial soft tissues of the plantar forefoot at the level of the second metatarsal head. No soft tissue gas. IMPRESSION: 1. No acute fracture or dislocation. No radiographic evidence of acute osteomyelitis. 2. A 3 mm linear radiopaque foreign object in the  superficial soft tissues of the plantar forefoot. Electronically Signed   By: Elgie Collard M.D.   On: 02/28/2022 00:45   DG Chest Port 1 View  Result Date: 02/27/2022 CLINICAL DATA:  Questionable sepsis, evaluate abnormality. EXAM: PORTABLE CHEST 1 VIEW  COMPARISON:  Radiographs dated January 14, 2022 FINDINGS: The heart is enlarged. Mild pulmonary vascular congestion. No focal consolidation or pleural effusion. No acute osseous abnormality IMPRESSION: Cardiomegaly with mild pulmonary vascular congestion. Electronically Signed   By: Larose Hires D.O.   On: 02/27/2022 23:49     Time coordinating discharge: Over 30 minutes    Lewie Chamber, MD  [facility] 03/01/2022, 2:47 PM

## 2022-03-02 LAB — URINE CULTURE: Culture: >=100000 — AB

## 2022-03-16 MED ORDER — LEVOTHYROXINE 88 MCG TABLET
ORAL_TABLET | Freq: Every day | ORAL | 3 refills | 90 days | Status: CP
Start: 2022-03-16 — End: 2023-03-16

## 2022-03-17 ENCOUNTER — Other Ambulatory Visit: Payer: Medicaid Other | Admitting: *Deleted

## 2022-03-17 ENCOUNTER — Ambulatory Visit: Payer: Medicaid Other | Attending: Pulmonary Disease

## 2022-03-17 ENCOUNTER — Encounter: Payer: Self-pay | Admitting: *Deleted

## 2022-03-17 DIAGNOSIS — Z87891 Personal history of nicotine dependence: Secondary | ICD-10-CM | POA: Insufficient documentation

## 2022-03-17 DIAGNOSIS — R0602 Shortness of breath: Secondary | ICD-10-CM | POA: Diagnosis present

## 2022-03-17 MED ORDER — ALBUTEROL SULFATE (2.5 MG/3ML) 0.083% IN NEBU
2.5000 mg | INHALATION_SOLUTION | Freq: Once | RESPIRATORY_TRACT | Status: AC
  Administered 2022-03-17: 2.5 mg via RESPIRATORY_TRACT

## 2022-03-17 NOTE — Patient Instructions (Signed)
Visit Information  [name] [name] was given information about Medicaid Managed Care team care coordination services as a part of their [place] Medicaid benefit. [name] [name] verbally consented to engagement with the [facility] Managed Care team.   If you are experiencing a medical emergency, please call 911 or report to your local emergency department or urgent care.   If you have a non-emergency medical problem during routine business hours, please contact your provider's office and ask to speak with a nurse.   For questions related to your [place] health plan, please call: [phone]  If you would like to schedule transportation through your [place] Medicaid plan, please call the following number at least 2 days in advance of your appointment: [phone].   There is no limit to the number of trips during the year between medical appointments, healthcare facilities, or pharmacies. Transportation must be scheduled at least 2 business days before but not more than thirty 30 days before of your appointment.  Call the Behavioral Health Crisis Line at ([phone], at any time, 24 hours a day, 7 days a week. If you are in danger or need immediate medical attention call 911.  If you would like help to quit smoking, call 1-800-QUIT-NOW ((908) 566-1989) OR Espaol: 1-855-Djelo-Ya (8-341-962-2297) o para ms informacin haga clic aqu or Text READY to 989-211 to register via text  [name] [name] - following are the goals we discussed in your visit today:   Please see education materials related to COPD and Pulmonary Rehab provided by MyChart link.  Patient verbalizes understanding of instructions and care plan provided today and agrees to view in MyChart. Active MyChart status and patient understanding of how to access instructions and care plan via MyChart confirmed with patient.     Telephone follow up appointment with Managed Medicaid care management team member  scheduled for:04/21/22 @ 9am  Estanislado Emms RN, BSN [place]  [facility] RN Care Coordinator   Following is a copy of your plan of care:  Care Plan : RN Care Manager Plan of Care  Updates made by Heidi Dach, RN since 03/17/2022 12:00 AM     Problem: Health Management needs related to COPD and Mental Health      Long-Range Goal: Development of Plan of Care To address Health Management needs related to COPD and Mental Health   Start Date: 03/17/2022  Expected End Date: 06/15/2022  Priority: High  Note:   Current Barriers:  Chronic Disease Management support and education needs related to COPD and Mental Health  RNCM Clinical Goal(s):  Patient will verbalize understanding of plan for management of COPD and Mental Health as evidenced by patient reports take all medications exactly as prescribed and will call provider for medication related questions as evidenced by patient reports and EMR documentation    attend all scheduled medical appointments: 03/17/22 for breathing test, 11/30 with BSW, 12/1 with LCSW, 12/4 with Rheumatology, 12/7 with Heart and Vascular/Podiatry, 12/11 with Pulmonology and 12/18 for Breast Imaging as evidenced by Provider documentation        continue to work with RN Care Manager and/or Social Worker to address care management and care coordination needs related to COPD and Mental Health as evidenced by adherence to CM Team Scheduled appointments     through collaboration with RN Care manager, provider, and care team.   Interventions: Inter-disciplinary care team collaboration (see longitudinal plan of care) Evaluation of current treatment plan related to  self management and patient's  adherence to plan as established by provider   COPD: (Status: New goal.) Long Term Goal  Reviewed medications with patient, including use of prescribed maintenance and rescue inhalers, and provided instruction on medication management and the importance of  adherence Provided patient with basic written and verbal COPD education on self care/management/and exacerbation prevention Provided instruction about proper use of medications used for management of COPD including inhalers Provided education about and advised patient to utilize infection prevention strategies to reduce risk of respiratory infection Discussed the importance of adequate rest and management of fatigue with COPD Screening for signs and symptoms of depression related to chronic disease state  Assessed social determinant of health barriers Discussed Pulmonary Rehab and advised patient to discuss with Pulmonology on 03/29/22 Advised patient to discuss portable oxygen concentrator with Pulmonology on 03/29/22 Referral to BSW for assistance with local food pantries and MOW-scheduled on 03/18/22 @ 9am Advised patient to contact [place] [phone] for member benefits   Mental Health  (Status:  New goal.)  Long Term Goal-Patient currently sees Psychiatry every 6 months, she would like to talk with someone more frequently Evaluation of current treatment plan related to  Mental Health ,  self-management and patient's adherence to plan as established by provider. Discussed plans with patient for ongoing care management follow up and provided patient with direct contact information for care management team Reviewed medications with patient and discussed Ambien is on medication list, but has not been dispensed since 10/2021, patient not sleeping at night Social Work referral for Assistance with Therapy/Counseling-scheduled with [name] on 03/19/22 @ 10:30am Screening for signs and symptoms of depression related to chronic disease state  Assessed social determinant of health barriers Collaborated with Psychiatry for guidance regarding Ambien   Patient Goals/Self-Care Activities: Take medications as prescribed   Attend all scheduled provider appointments Call provider office for  new concerns or questions  Work with the social worker to address care coordination needs and will continue to work with the clinical team to address health care and disease management related needs call 1-800-273-TALK (toll free, 24 hour hotline) go to [facility] [address], [place] [phone]) call 911 if experiencing a Mental Health or Behavioral Health Crisis  avoid second hand smoke limit outdoor activity during cold weather develop a new routine to improve sleep practice relaxation or meditation daily Discuss pulmonary rehab with provider Cut back on sugary drinks

## 2022-03-17 NOTE — Patient Outreach (Signed)
Medicaid Managed Care   Nurse Care Manager Note  03/17/2022 Name:  [name] [name] MRN:  [id number] DOB:  [birth year]  [name] [name] is an 61 y.o. year old female who is a primary patient of [name] Lamb, NP.  The [facility] Managed Care Coordination team was consulted for assistance with:    COPD Mental Health  [name] [name] was given information about Medicaid Managed Care Coordination team services today. [name] [name] Patient agreed to services and verbal consent obtained.  Engaged with patient by telephone for initial visit in response to provider referral for case management and/or care coordination services.   Assessments/Interventions:  Review of past medical history, allergies, medications, health status, including review of consultants reports, laboratory and other test data, was performed as part of comprehensive evaluation and provision of chronic care management services.  SDOH (Social Determinants of Health) assessments and interventions performed: SDOH Interventions    Flowsheet Row Patient Outreach Telephone from 03/17/2022 in [place] ED to Hosp-Admission (Discharged) from 12/27/2021 in [place] Office Visit from 03/31/2016 in [place] Office Visit from 08/29/2015 in [place]  SDOH Interventions      Food Insecurity Interventions -- Intervention Not Indicated -- --  Housing Interventions Intervention Not Indicated Intervention Not Indicated -- --  Transportation Interventions Intervention Not Indicated Intervention Not Indicated -- --  Utilities Interventions -- Intervention Not Indicated -- --  Depression Interventions/Treatment  Currently on Treatment  [Referral to LCSW] -- Medication, Currently on Treatment Currently on Treatment       Care Plan  Allergies  Allergen Reactions   Cephalexin Hives    Codeine Palpitations, Nausea Only, Nausea And Vomiting, Rash and Shortness Of Breath    "makes heart fly, she gets flushed and passes out"   Doxycycline Rash   Propoxyphene Rash and Shortness Of Breath    Increase heart rate   Sulfa Antibiotics Palpitations, Nausea Only, Shortness Of Breath and Hives    "makes heart fly, she gets flushed and passes out"   Clindamycin Rash    Tongue swelling, oral sores, and Mouth rash   Lovenox [Enoxaparin Sodium] Hives   Hydrocodone Nausea And Vomiting    Hear racing & breaks out into a cold sweat.   Meropenem Rash    Erythematous, hot, pruritic rash over arms, chest, back, abdomen, and face occurred at the end of meropenem infusion on 02/22/18    Medications Reviewed Today     Reviewed by [name] Montane, RN (Registered Nurse) on 03/17/22 at [id number]  Med List Status: <None>   Medication Order Taking? Sig Documenting Provider Last Dose Status Informant  acetaminophen (TYLENOL) 325 MG tablet [id number] Yes Take 2 tablets (650 mg total) by mouth every 6 (six) hours as needed for mild pain or moderate pain (or Fever >/= 101). Raiford Noble [place], [place] Taking Active Self  amLODipine (NORVASC) 10 MG tablet [id number] Yes Take 10 mg by mouth daily. [provider] Taking Active Self  ANORO ELLIPTA 62.5-25 MCG/ACT AEPB [id number] No 1 puff daily.  Patient not taking: Reported on 03/01/2022   [provider] Not Taking Active Self  ARIPiprazole (ABILIFY) 5 MG tablet [id number] Yes Take 5 mg by mouth daily. [provider] Taking Active Self  ascorbic acid (VITAMIN C) 500 MG tablet [id number] Yes Take 1 tablet (500 mg total) by mouth daily. Lavina Hamman, MD Taking Active Self  atorvastatin (  LIPITOR) 80 MG tablet [id number] Yes Take 80 mg by mouth daily. [provider] Taking Active Self  Budeson-Glycopyrrol-Formoterol (BREZTRI AEROSPHERE) 160-9-4.8 MCG/ACT AERO [id number] Yes Inhale 2 puffs into the lungs in the morning and at bedtime.  Salena Saner, MD Taking Active Self  busPIRone (BUSPAR) 10 MG tablet [id number] Yes Take 1 tablet (10 mg total) by mouth 2 (two) times daily. Clapacs, Jackquline Denmark, MD Taking Active Self  BUTRANS 10 MCG/HR PTWK [id number] Yes PLACE 1 PATCH ONTO THE SKIN ONCE A WEEK FOR 28 DAYS. [provider] Taking Active Self           Med Note Freda Munro Mar 01, 2022 11:31 AM) New patch due today, per Patient  Calcium Citrate-Vitamin D3 (GNP CALCIUM CITRATE+D MAXIMUM) 315-6.25 MG-MCG TABS [id number] Yes Take 1 tablet by mouth daily. [provider] Taking Active Self  Cholecalciferol (VITAMIN D) 125 MCG (5000 UT) CAPS [id number] Yes Take 1 capsule by mouth daily. [provider] Taking Active Self  ELIQUIS 5 MG TABS tablet [id number] Yes TAKE 1 TABLET BY MOUTH TWICE DAILY  Patient taking differently: Take 5 mg by mouth 2 (two) times daily.   Salena Saner, MD Taking Active Self  famotidine (PEPCID) 20 MG tablet [id number] Yes Take 1 tablet (20 mg total) by mouth daily. Rolly Salter, MD Taking Active Self  FLUoxetine (PROZAC) 40 MG capsule [id number] Yes Take 1 capsule (40 mg total) by mouth daily. Clapacs, Jackquline Denmark, MD Taking Active Self  folic acid (FOLVITE) 1 MG tablet [id number] Yes Take 1 mg by mouth daily.  [provider] Taking Active Self  hydroxychloroquine (PLAQUENIL) 200 MG tablet [id number] Yes Take 200 mg by mouth 2 (two) times daily. [provider] Taking Active Self  levothyroxine (SYNTHROID, LEVOTHROID) 88 MCG tablet [id number] Yes Take 88 mcg by mouth daily before breakfast. [provider] Taking Active Self  metFORMIN (GLUCOPHAGE-XR) 500 MG 24 hr tablet [id number] Yes Take 500 mg by mouth daily. [provider] Taking Active Self  metoCLOPramide (REGLAN) 5 MG/5ML solution [id number] Yes Take 5 mg by mouth 4 (four) times daily -  before meals and at bedtime. [provider] Taking Active Self           Med Note  (Nelton Amsden A   Wed Mar 17, 2022  9:19 AM) Taking 10 mg 4 times daily  metoprolol tartrate (LOPRESSOR) 25 MG tablet [id number] Yes Take 25 mg by mouth 2 (two) times daily. [provider] Taking Active Self  oxyCODONE-acetaminophen (PERCOCET/ROXICET) 5-325 MG tablet [id number] No Take 1 tablet by mouth every 8 (eight) hours as needed for severe pain.  Patient not taking: Reported on 03/17/2022   [provider] Not Taking Active Self  pramipexole (MIRAPEX) 0.125 MG tablet [id number] Yes Take 0.25 mg by mouth at bedtime.  [provider] Taking Active Self  pregabalin (LYRICA) 100 MG capsule [id number] Yes Take 1 capsule (100 mg total) by mouth 3 (three) times daily. Edward Jolly, MD Taking Active Self  promethazine (PHENERGAN) 12.5 MG tablet [id number] Yes Take 12.5 mg by mouth. [provider] Taking Active Self  QUEtiapine (SEROQUEL) 300 MG tablet [id number] Yes TAKE ONE TABLET BY MOUTH AT BEDTIME. Clapacs, Jackquline Denmark, MD Taking Active Self  TRADJENTA 5 MG TABS tablet [id number] Yes Take 5 mg by mouth daily. [provider] Taking Active Self  zinc sulfate 220 (50 Zn) MG capsule [id number] Yes Take 220 mg by mouth at bedtime.  [provider] Taking Active Self  zolpidem (AMBIEN) 5 MG tablet [id number] Yes Take 1 tablet (5 mg total) by mouth at bedtime. Clapacs, Madie [place], MD Taking Active Self            Patient Active Problem List   Diagnosis Date Noted   Gastroparesis 02/28/2022   Allergy to multiple antibiotics 02/28/2022   Lactic acidosis 01/05/2022   Sepsis ([place]) 01/04/2022   Acute cystitis 12/27/2021   Morbid obesity ([place])    Depression with anxiety    PAF (paroxysmal atrial fibrillation) (HCC)    Chronic respiratory failure with hypoxia ([place]) 02/07/2020   Paroxysmal A-fib ([place])    Morbid obesity with BMI of 50.0-59.9, adult ([place]) 07/08/2019   Controlled type 2 diabetes mellitus without complication, without long-term current use of  insulin ([place]) 04/06/2019   CKD (chronic kidney disease), stage III ([place]) 04/06/2019   HLD (hyperlipidemia) 04/06/2019   AKI (acute kidney injury) ([place]) 02/12/2019   Diabetic ulcer of left foot ([place]) 02/14/2018   Chronic pain syndrome 03/31/2016   Rheumatoid arthritis ([place]) 12/25/2015   Osteoarthritis, multiple sites 12/24/2015   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[employer]) 12/23/2015   Long term current use of opiate analgesic 12/23/2015   Long term prescription opiate use 12/23/2015   Diabetic peripheral neuropathy ([place]) 12/23/2015   Chronic diastolic CHF (congestive heart failure) ([place]) 07/03/2014   COPD (chronic obstructive pulmonary disease) ([place]) 01/03/2014   Bipolar disorder, unspecified ([place]) 01/03/2014   Borderline personality disorder ([place]) 01/06/2012   Hypothyroidism 06/28/2010   Rheumatoid arthritis involving multiple sites with positive rheumatoid factor ([place]) 06/28/2010   Essential (primary) hypertension 01/27/2005   Major depressive disorder, recurrent episode, moderate ([place]) 06/03/2004    Conditions to be addressed/monitored per PCP order:  COPD and Mental Health  Care Plan : RN Care Manager Plan of Care  Updates made by [name] Montane, RN since 03/17/2022 12:00 AM     Problem: Health Management needs related to COPD and Mental Health      Long-Range Goal: Development of Plan of Care To address Health Management needs related to COPD and Mental Health   Start Date: 03/17/2022  Expected End Date: 06/15/2022  Priority: High  Note:   Current Barriers:  Chronic Disease Management support and education needs related to COPD and [place]):  Patient will verbalize understanding of plan for management of COPD and Mental Health as evidenced by patient reports take all medications exactly as prescribed and will call provider for medication related questions as evidenced by patient reports and EMR documentation    attend all  scheduled medical appointments: 03/17/22 for breathing test, 11/30 with BSW, 12/1 with LCSW, 12/4 with Rheumatology, 12/7 with Heart and Vascular/Podiatry, 12/11 with Pulmonology and 12/18 for Breast Imaging as evidenced by Provider documentation        continue to work with RN Care Manager and/or Social Worker to address care management and care coordination needs related to COPD and [place] as evidenced by adherence to CM Team Scheduled appointments     through collaboration with RN Care manager, provider, and care team.   Interventions: Inter-disciplinary care team collaboration (see longitudinal plan of care) Evaluation of current treatment plan related to  self management and patient's adherence to plan as established by provider   COPD: (Status: New goal.) Long Term Goal  Reviewed medications with patient, including use of prescribed maintenance and rescue inhalers, and provided instruction on medication management and the importance of  adherence Provided patient with basic written and verbal COPD education on self care/management/and exacerbation prevention Provided instruction about proper use of medications used for management of COPD including inhalers Provided education about and advised patient to utilize infection prevention strategies to reduce risk of respiratory infection Discussed the importance of adequate rest and management of fatigue with COPD Screening for signs and symptoms of depression related to chronic disease state  Assessed social determinant of health barriers Discussed Pulmonary Rehab and advised patient to discuss with Pulmonology on 03/29/22 Advised patient to discuss portable oxygen concentrator with Pulmonology on 03/29/22 Referral to BSW for assistance with local food pantries and MOW-scheduled on 03/18/22 @ [place] patient to contact [place] [employer] [phone] for member benefits   [place]  (Status:  New goal.)  [place]  currently sees Psychiatry every 6 months, she would like to talk with someone more frequently Evaluation of current treatment plan related to  Mental Health ,  self-management and patient's adherence to plan as established by provider. Discussed plans with patient for ongoing care management follow up and provided patient with direct contact information for care management team Reviewed medications with patient and discussed Ambien is on medication list, but has not been dispensed since 10/2021, patient not sleeping at night Social Work referral for Assistance with Therapy/Counseling-scheduled with Jerene Pitch on 03/19/22 @ 10:30am Screening for signs and symptoms of depression related to chronic disease state  Assessed social determinant of health barriers Collaborated with Psychiatry for guidance regarding Ambien   Patient Goals/Self-Care Activities: Take medications as prescribed   Attend all scheduled provider appointments Call provider office for new concerns or questions  Work with the social worker to address care coordination needs and will continue to work with the clinical team to address health care and disease management related needs call 1-800-273-TALK (toll free, 24 hour hotline) go to [facility] [address], [place] [phone]) call 911 if experiencing a Mental Health or [place]  avoid second hand smoke limit outdoor activity during cold weather develop a new routine to improve sleep practice relaxation or meditation daily Discuss pulmonary rehab with provider Cut back on sugary drinks       Follow Up:  Patient agrees to Care Plan and Follow-up.  Plan: The Managed Medicaid care management team will reach out to the patient again over the next 30 days.  Date/time of next scheduled RN care management/care coordination outreach:  04/21/22 @ [place] RN, [place] RN Care  Coordinator

## 2022-03-18 ENCOUNTER — Other Ambulatory Visit: Payer: Medicaid Other

## 2022-03-18 NOTE — Patient Instructions (Signed)
Visit Information  [name] [name] was given information about Medicaid Managed Care team care coordination services as a part of their [place] Medicaid benefit. [name] [name] verbally consented to engagement with the [facility] Managed Care team.   If you are experiencing a medical emergency, please call 911 or report to your local emergency department or urgent care.   If you have a non-emergency medical problem during routine business hours, please contact your provider's office and ask to speak with a nurse.   For questions related to your [place] health plan, please call: [phone]  If you would like to schedule transportation through your [place] plan, please call the following number at least 2 days in advance of your appointment: [phone].   There is no limit to the number of trips during the year between medical appointments, healthcare facilities, or pharmacies. Transportation must be scheduled at least 2 business days before but not more than thirty 30 days before of your appointment.  Call the Behavioral Health Crisis Line at [phone], at any time, 24 hours a day, 7 days a week. If you are in danger or need immediate medical attention call 911.  If you would like help to quit smoking, call 1-800-QUIT-NOW (7786082958) OR Espaol: 1-855-Djelo-Ya (8-315-176-1607) o para ms informacin haga clic aqu or Text READY to 371-062 to register via text  Ms. Jakob - following are the goals we discussed in your visit today:     Social Worker will follow up on 04/07/22.   [name] [name], BSW, [facility]  [place]  High Risk Managed Medicaid Team  [phone]   Following is a copy of your plan of care:  Care Plan : RN Care Manager Plan of Care  Updates made by [name] [name] since 03/18/2022 12:00 AM     Problem: Health Management needs related to COPD and Mental Health      Long-Range Goal:  Development of Plan of Care To address Health Management needs related to COPD and Mental Health   Start Date: 03/17/2022  Expected End Date: 06/15/2022  Priority: High  Note:   Current Barriers:  Chronic Disease Management support and education needs related to COPD and Mental Health  RNCM Clinical Goal(s):  Patient will verbalize understanding of plan for management of COPD and Mental Health as evidenced by patient reports take all medications exactly as prescribed and will call provider for medication related questions as evidenced by patient reports and EMR documentation    attend all scheduled medical appointments: 03/17/22 for breathing test, 11/30 with BSW, 12/1 with LCSW, 12/4 with Rheumatology, 12/7 with Heart and Vascular/Podiatry, 12/11 with Pulmonology and 12/18 for Breast Imaging as evidenced by Provider documentation        continue to work with RN Care Manager and/or Social Worker to address care management and care coordination needs related to COPD and Mental Health as evidenced by adherence to CM Team Scheduled appointments     through collaboration with RN Care manager, provider, and care team.   Interventions: Inter-disciplinary care team collaboration (see longitudinal plan of care) Evaluation of current treatment plan related to  self management and patient's adherence to plan as established by provider BSW completed a telephone outreach with patient for MOW and food pantries. Patient states she does receive SSI, but does not receive foodstamps because they will take it out of her check. BSW will mail patient a list of food pantries in [place] and information on  MOW. No other resources are needed at this time.   COPD: (Status: New goal.) Long Term Goal  Reviewed medications with patient, including use of prescribed maintenance and rescue inhalers, and provided instruction on medication management and the importance of adherence Provided patient with basic written  and verbal COPD education on self care/management/and exacerbation prevention Provided instruction about proper use of medications used for management of COPD including inhalers Provided education about and advised patient to utilize infection prevention strategies to reduce risk of respiratory infection Discussed the importance of adequate rest and management of fatigue with COPD Screening for signs and symptoms of depression related to chronic disease state  Assessed social determinant of health barriers Discussed Pulmonary Rehab and advised patient to discuss with Pulmonology on 03/29/22 Advised patient to discuss portable oxygen concentrator with Pulmonology on 03/29/22 Referral to BSW for assistance with local food pantries and MOW-scheduled on 03/18/22 @ 9am Advised patient to contact [place] [phone] for member benefits   Mental Health  (Status:  New goal.)  Long Term Goal-Patient currently sees Psychiatry every 6 months, she would like to talk with someone more frequently Evaluation of current treatment plan related to  Mental Health ,  self-management and patient's adherence to plan as established by provider. Discussed plans with patient for ongoing care management follow up and provided patient with direct contact information for care management team Reviewed medications with patient and discussed Ambien is on medication list, but has not been dispensed since 10/2021, patient not sleeping at night Social Work referral for Assistance with Therapy/Counseling-scheduled with [name] [name] on 03/19/22 @ 10:30am Screening for signs and symptoms of depression related to chronic disease state  Assessed social determinant of health barriers Collaborated with Psychiatry for guidance regarding Ambien   Patient Goals/Self-Care Activities: Take medications as prescribed   Attend all scheduled provider appointments Call provider office for new concerns or questions  Work with the social  worker to address care coordination needs and will continue to work with the clinical team to address health care and disease management related needs call 1-800-273-TALK (toll free, 24 hour hotline) go to [facility] [address] [place] [phone]) call 911 if experiencing a Mental Health or Behavioral Health Crisis  avoid second hand smoke limit outdoor activity during cold weather develop a new routine to improve sleep practice relaxation or meditation daily Discuss pulmonary rehab with provider Cut back on sugary drinks

## 2022-03-18 NOTE — Patient Outreach (Signed)
Medicaid Managed Care Social Work Note  03/18/2022 Name:  [name] [name] MRN:  [id number] DOB:  [birth year]  [name] [name] is an 61 y.o. year old female who is a primary patient of Etheleen Nicks, NP.  The [facility] Managed Care Coordination team was consulted for assistance with:  Food Insecurity  Ms. Burkemper was given information about Medicaid Managed Care Coordination team services today. [name] Patient agreed to services and verbal consent obtained.  Engaged with patient  for by telephone forinitial visit in response to referral for case management and/or care coordination services.   Assessments/Interventions:  Review of past medical history, allergies, medications, health status, including review of consultants reports, laboratory and other test data, was performed as part of comprehensive evaluation and provision of chronic care management services.  SDOH: (Social Determinant of Health) assessments and interventions performed: SDOH Interventions    Flowsheet Row Patient Outreach Telephone from 03/17/2022 in [place] [facility] DEPART[facility] ED to Hosp-Admission (Discharged) from 12/27/2021 in [facility] MEDICAL TELEMETRY Office Visit from 03/31/2016 in [place] [facility] Office Visit from 08/29/2015 in [facility]  SDOH Interventions      Food Insecurity Interventions -- Intervention Not Indicated -- --  Housing Interventions Intervention Not Indicated Intervention Not Indicated -- --  Transportation Interventions Intervention Not Indicated Intervention Not Indicated -- --  Utilities Interventions -- Intervention Not Indicated -- --  Depression Interventions/Treatment  Currently on Treatment  [Referral to LCSW] -- Medication, Currently on Treatment Currently on Treatment       Advanced Directives Status:  Not addressed in this encounter.  Care Plan                  Allergies  Allergen Reactions   Cephalexin Hives   Codeine Palpitations, Nausea Only, Nausea And Vomiting, Rash and Shortness Of Breath    "makes heart fly, she gets flushed and passes out"   Doxycycline Rash   Propoxyphene Rash and Shortness Of Breath    Increase heart rate   Sulfa Antibiotics Palpitations, Nausea Only, Shortness Of Breath and Hives    "makes heart fly, she gets flushed and passes out"   Clindamycin Rash    Tongue swelling, oral sores, and Mouth rash   Lovenox [Enoxaparin Sodium] Hives   Hydrocodone Nausea And Vomiting    Hear racing & breaks out into a cold sweat.   Meropenem Rash    Erythematous, hot, pruritic rash over arms, chest, back, abdomen, and face occurred at the end of meropenem infusion on 02/22/18    Medications Reviewed Today     Reviewed by Heidi Dach, RN (Registered Nurse) on 03/17/22 at [phone]  Med List Status: <None>   Medication Order Taking? Sig Documenting Provider Last Dose Status Informant  acetaminophen (TYLENOL) 325 MG tablet [id number] Yes Take 2 tablets (650 mg total) by mouth every 6 (six) hours as needed for mild pain or moderate pain (or Fever >/= 101). Marguerita Merles [place], [place] Taking Active Self  amLODipine (NORVASC) 10 MG tablet [id number] Yes Take 10 mg by mouth daily. [provider] Taking Active Self  ANORO ELLIPTA 62.5-25 MCG/ACT AEPB [id number] No 1 puff daily.  Patient not taking: Reported on 03/01/2022   [provider] Not Taking Active Self  ARIPiprazole (ABILIFY) 5 MG tablet [id number] Yes Take 5 mg by mouth daily. [provider] Taking Active Self  ascorbic acid (VITAMIN C) 500 MG tablet [id number]  Yes Take 1 tablet (500 mg total) by mouth daily. Rolly Salter, MD Taking Active Self  atorvastatin (LIPITOR) 80 MG tablet [id number] Yes Take 80 mg by mouth daily. [provider] Taking Active Self  Budeson-Glycopyrrol-Formoterol (BREZTRI AEROSPHERE) 160-9-4.8 MCG/ACT AERO [id number] Yes  Inhale 2 puffs into the lungs in the morning and at bedtime. Salena Saner, MD Taking Active Self  busPIRone (BUSPAR) 10 MG tablet [id number] Yes Take 1 tablet (10 mg total) by mouth 2 (two) times daily. Clapacs, Jackquline Denmark, MD Taking Active Self  BUTRANS 10 MCG/HR PTWK [id number] Yes PLACE 1 PATCH ONTO THE SKIN ONCE A WEEK FOR 28 DAYS. [provider] Taking Active Self           Med Note Freda Munro Mar 01, 2022 11:31 AM) New patch due today, per Patient  Calcium Citrate-Vitamin D3 (GNP CALCIUM CITRATE+D MAXIMUM) 315-6.25 MG-MCG TABS [id number] Yes Take 1 tablet by mouth daily. [provider] Taking Active Self  Cholecalciferol (VITAMIN D) 125 MCG (5000 UT) CAPS [id number] Yes Take 1 capsule by mouth daily. [provider] Taking Active Self  ELIQUIS 5 MG TABS tablet [id number] Yes TAKE 1 TABLET BY MOUTH TWICE DAILY  Patient taking differently: Take 5 mg by mouth 2 (two) times daily.   Salena Saner, MD Taking Active Self  famotidine (PEPCID) 20 MG tablet [id number] Yes Take 1 tablet (20 mg total) by mouth daily. Rolly Salter, MD Taking Active Self  FLUoxetine (PROZAC) 40 MG capsule [id number] Yes Take 1 capsule (40 mg total) by mouth daily. Clapacs, Jackquline Denmark, MD Taking Active Self  folic acid (FOLVITE) 1 MG tablet [id number] Yes Take 1 mg by mouth daily.  [provider] Taking Active Self  hydroxychloroquine (PLAQUENIL) 200 MG tablet [id number] Yes Take 200 mg by mouth 2 (two) times daily. [provider] Taking Active Self  levothyroxine (SYNTHROID, LEVOTHROID) 88 MCG tablet [id number] Yes Take 88 mcg by mouth daily before breakfast. [provider] Taking Active Self  metFORMIN (GLUCOPHAGE-XR) 500 MG 24 hr tablet [id number] Yes Take 500 mg by mouth daily. [provider] Taking Active Self  metoCLOPramide (REGLAN) 5 MG/5ML solution [id number] Yes Take 5 mg by mouth 4 (four) times daily -  before meals and at bedtime.  [provider] Taking Active Self           Med Note (ROBB, MELANIE A   Wed Mar 17, 2022  9:19 AM) Taking 10 mg 4 times daily  metoprolol tartrate (LOPRESSOR) 25 MG tablet [id number] Yes Take 25 mg by mouth 2 (two) times daily. [provider] Taking Active Self  oxyCODONE-acetaminophen (PERCOCET/ROXICET) 5-325 MG tablet [id number] No Take 1 tablet by mouth every 8 (eight) hours as needed for severe pain.  Patient not taking: Reported on 03/17/2022   [provider] Not Taking Active Self  pramipexole (MIRAPEX) 0.125 MG tablet [id number] Yes Take 0.25 mg by mouth at bedtime.  [provider] Taking Active Self  pregabalin (LYRICA) 100 MG capsule [id number] Yes Take 1 capsule (100 mg total) by mouth 3 (three) times daily. Edward Jolly, MD Taking Active Self  promethazine (PHENERGAN) 12.5 MG tablet [id number] Yes Take 12.5 mg by mouth. [provider] Taking Active Self  QUEtiapine (SEROQUEL) 300 MG tablet [id number] Yes TAKE ONE TABLET BY MOUTH AT BEDTIME. Clapacs, Jackquline Denmark, MD Taking Active Self  TRADJENTA 5 MG TABS tablet [id number] Yes Take 5 mg by mouth daily. [provider] Taking  Active Self  zinc sulfate 220 (50 Zn) MG capsule [id number] Yes Take 220 mg by mouth at bedtime.  [provider] Taking Active Self  zolpidem (AMBIEN) 5 MG tablet [id number] Yes Take 1 tablet (5 mg total) by mouth at bedtime. Clapacs, Jackquline Denmark, MD Taking Active Self          BSW completed a telephone outreach with patient for MOW and food pantries. Patient states she does receive SSI, but does not receive foodstamps because they will take it out of her check. BSW will mail patient a list of food pantries in [place] and information on MOW. No other resources are needed at this time.  Patient Active Problem List   Diagnosis Date Noted   Gastroparesis 02/28/2022   Allergy to multiple antibiotics 02/28/2022   Lactic acidosis 01/05/2022   Sepsis (HCC)  01/04/2022   Acute cystitis 12/27/2021   Morbid obesity (HCC)    Depression with anxiety    PAF (paroxysmal atrial fibrillation) (HCC)    Chronic respiratory failure with hypoxia (HCC) 02/07/2020   Paroxysmal A-fib (HCC)    Morbid obesity with BMI of 50.0-59.9, adult (HCC) 07/08/2019   Controlled type 2 diabetes mellitus without complication, without long-term current use of insulin (HCC) 04/06/2019   CKD (chronic kidney disease), stage III (HCC) 04/06/2019   HLD (hyperlipidemia) 04/06/2019   AKI (acute kidney injury) (HCC) 02/12/2019   Diabetic ulcer of left foot (HCC) 02/14/2018   Chronic pain syndrome 03/31/2016   Rheumatoid arthritis (HCC) 12/25/2015   Osteoarthritis, multiple sites 12/24/2015   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[employer]) 12/23/2015   Long term current use of opiate analgesic 12/23/2015   Long term prescription opiate use 12/23/2015   Diabetic peripheral neuropathy (HCC) 12/23/2015   Chronic diastolic CHF (congestive heart failure) (HCC) 07/03/2014   COPD (chronic obstructive pulmonary disease) (HCC) 01/03/2014   Bipolar disorder, unspecified (HCC) 01/03/2014   Borderline personality disorder (HCC) 01/06/2012   Hypothyroidism 06/28/2010   Rheumatoid arthritis involving multiple sites with positive rheumatoid factor (HCC) 06/28/2010   Essential (primary) hypertension 01/27/2005   Major depressive disorder, recurrent episode, moderate (HCC) 06/03/2004    Conditions to be addressed/monitored per PCP order:   food   Care Plan : RN Care Manager Plan of Care  Updates made by Shaune Leeks since 03/18/2022 12:00 AM     Problem: Health Management needs related to COPD and Mental Health      Long-Range Goal: Development of Plan of Care To address Health Management needs related to COPD and Mental Health   Start Date: 03/17/2022  Expected End Date: 06/15/2022  Priority: High  Note:   Current Barriers:  Chronic Disease Management support  and education needs related to COPD and Mental Health  RNCM Clinical Goal(s):  Patient will verbalize understanding of plan for management of COPD and Mental Health as evidenced by patient reports take all medications exactly as prescribed and will call provider for medication related questions as evidenced by patient reports and EMR documentation    attend all scheduled medical appointments: 03/17/22 for breathing test, 11/30 with BSW, 12/1 with LCSW, 12/4 with Rheumatology, 12/7 with Heart and Vascular/Podiatry, 12/11 with Pulmonology and 12/18 for Breast Imaging as evidenced by Provider documentation        continue to work with RN Care Manager and/or Social Worker to address care management and care coordination needs related to COPD and Mental Health as evidenced by adherence to [facility] Team Scheduled appointments  through collaboration with RN Care manager, provider, and care team.   Interventions: Inter-disciplinary care team collaboration (see longitudinal plan of care) Evaluation of current treatment plan related to  self management and patient's adherence to plan as established by provider BSW completed a telephone outreach with patient for MOW and food pantries. Patient states she does receive SSI, but does not receive foodstamps because they will take it out of her check. BSW will mail patient a list of food pantries in [place] and information on MOW. No other resources are needed at this time.   COPD: (Status: New goal.) Long Term Goal  Reviewed medications with patient, including use of prescribed maintenance and rescue inhalers, and provided instruction on medication management and the importance of adherence Provided patient with basic written and verbal COPD education on self care/management/and exacerbation prevention Provided instruction about proper use of medications used for management of COPD including inhalers Provided education about and advised patient to utilize  infection prevention strategies to reduce risk of respiratory infection Discussed the importance of adequate rest and management of fatigue with COPD Screening for signs and symptoms of depression related to chronic disease state  Assessed social determinant of health barriers Discussed Pulmonary Rehab and advised patient to discuss with Pulmonology on 03/29/22 Advised patient to discuss portable oxygen concentrator with Pulmonology on 03/29/22 Referral to BSW for assistance with local food pantries and MOW-scheduled on 03/18/22 @ 9am Advised patient to contact [place] [employer] [phone] for member benefits   Mental Health  (Status:  New goal.)  Long Term Goal-Patient currently sees Psychiatry every 6 months, she would like to talk with someone more frequently Evaluation of current treatment plan related to  Mental Health ,  self-management and patient's adherence to plan as established by provider. Discussed plans with patient for ongoing care management follow up and provided patient with direct contact information for care management team Reviewed medications with patient and discussed Ambien is on medication list, but has not been dispensed since 10/2021, patient not sleeping at night Social Work referral for Assistance with Therapy/Counseling-scheduled with [name] on 03/19/22 @ 10:30am Screening for signs and symptoms of depression related to chronic disease state  Assessed social determinant of health barriers Collaborated with Psychiatry for guidance regarding Ambien   Patient Goals/Self-Care Activities: Take medications as prescribed   Attend all scheduled provider appointments Call provider office for new concerns or questions  Work with the social worker to address care coordination needs and will continue to work with the clinical team to address health care and disease management related needs call 1-800-273-TALK (toll free, 24 hour hotline) go to [facility] [address] [place] [phone]) call 911 if experiencing a Mental Health or Behavioral Health Crisis  avoid second hand smoke limit outdoor activity during cold weather develop a new routine to improve sleep practice relaxation or meditation daily Discuss pulmonary rehab with provider Cut back on sugary drinks       Follow up:  Patient agrees to Care Plan and Follow-up.  Plan: The Managed Medicaid care management team will reach out to the patient again over the next 14 days.  Date/time of next scheduled Social Work care management/care coordination outreach:  04/07/22  Gus Puma, Kenard Gower, [facility]  [facility]  High Risk Managed Medicaid Team  [phone]

## 2022-03-19 ENCOUNTER — Other Ambulatory Visit: Payer: Medicaid Other | Admitting: Licensed Clinical Social Worker

## 2022-03-19 DIAGNOSIS — F319 Bipolar disorder, unspecified: Secondary | ICD-10-CM

## 2022-03-19 DIAGNOSIS — F418 Other specified anxiety disorders: Secondary | ICD-10-CM

## 2022-03-19 DIAGNOSIS — F603 Borderline personality disorder: Secondary | ICD-10-CM

## 2022-03-19 NOTE — Addendum Note (Signed)
Addended by: Gustavus Bryant on: 03/19/2022 11:29 AM   Modules accepted: Orders

## 2022-03-19 NOTE — Patient Instructions (Signed)
Visit Information  [name] [name] was given information about Medicaid Managed Care team care coordination services as a part of their [place] Complete Medicaid benefit. [name] [name] verbally consented to engagement with the [facility] Managed Care team.   If you are experiencing a medical emergency, please call 911 or report to your local emergency department or urgent care.   If you have a non-emergency medical problem during routine business hours, please contact your provider's office and ask to speak with a nurse.   For questions related to your [place] Complete Medicaid health plan, please call: [phone]  If you would like to schedule transportation through your [place] Complete Medicaid plan, please call the following number at least 2 days in advance of your appointment: [phone].   There is no limit to the number of trips during the year between medical appointments, healthcare facilities, or pharmacies. Transportation must be scheduled at least 2 business days before but not more than thirty 30 days before of your appointment.  Call the [place] at [phone], at any time, 24 hours a day, 7 days a week. If you are in danger or need immediate medical attention call 911.  If you would like help to quit smoking, call 1-800-QUIT-NOW 272 006 4393) OR Espaol: 1-855-Djelo-Ya (9-381-829-9371) o para ms informacin haga clic aqu or Text READY to 200-400 to register via text  Following is a copy of your plan of care:  Care Plan : LCSW Plan of Care  Updates made by [name] Cutter, LCSW since 03/19/2022 12:00 AM     Problem: Depression Identification (Depression)      Long-Range Goal: Depressive Symptoms Identified   Start Date: 03/19/2022  Priority: High  Note:   Priority: High  Timeframe:  Long-Range Goal Priority:  High Start Date:   03/19/22           Expected End Date:  ongoing                     Follow Up Date--03/26/22 at 1 pm  - keep 90  percent of scheduled appointments -consider counseling or psychiatry -consider bumping up your self-care  -consider creating a stronger support network   Why is this important?             Beating depression may take some time.            If you don't feel better right away, don't give up on your treatment plan.    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 Patient requires Support, Education, Resources, Referrals, Advocacy, and Care Coordination, in order to meet Unmet Mental Health Needs. Patient will implement clinical interventions discussed today to decrease symptoms and increase knowledge and/or ability of: coping skills. Mental Health Concerns and Social Isolation Patient lacks knowledge of available community counseling agencies and resources.  Clinical Goal(s): verbalize understanding of plan for management of Anxiety, Depression, and Stress and demonstrate a reduction in symptoms. Patient will connect with a provider for ongoing mental health treatment, increase coping skills, healthy habits, self-management skills, and stress reduction         Patient Goals/Self-Care Activities: Over the next 120 days Attend scheduled medical appointments Utilize healthy coping skills and supportive resources discussed Contact PCP with any questions or concerns Keep 90 percent of counseling appointments Call your insurance provider for more information about your Enhanced Benefits  Check out counseling resources provided  Begin personal counseling with  LCSW, to reduce and manage symptoms of Depression and Stress, until well-established with mental health provider Accept all calls from representative with [employer] in an effort to establish ongoing mental health counseling and supportive services. Incorporate into daily practice - relaxation techniques, deep breathing exercises, and mindfulness meditation strategies. Talk about feelings with friends,  family members, spiritual advisor, etc. Contact LCSW directly [phone]), if you have questions, need assistance, or if additional social work needs are identified between now and our next scheduled telephone outreach call. Call 988 for mental health hotline/crisis line if needed (24/7 available) Try techniques to reduce symptoms of anxiety/negative thinking (deep breathing, distraction, positive self talk, etc)  - develop a personal safety plan - develop a plan to deal with triggers like holidays, anniversaries - exercise at least 2 to 3 times per week - have a plan for how to handle bad days - journal feelings and what helps to feel better or worse - spend time or talk with others at least 2 to 3 times per week - watch for early signs of feeling worse - begin personal counseling - call and visit an old friend - check out volunteer opportunities - join a support group - laugh; watch a funny movie or comedian - learn and use visualization or guided imagery - perform a random act of kindness - practice relaxation or meditation daily - start or continue a personal journal - practice positive thinking and self-talk -continue with compliance of taking medication  -identify current effective and ineffective coping strategies.  -implement positive self-talk in care to increase self-esteem, confidence and feelings of control.  -consider alternative and complementary therapy approaches such as meditation, mindfulness or yoga.  -journaling, prayer, worship services, meditation or pastoral counseling.  -increase participation in pleasurable group activities such as hobbies, singing, sports or volunteering).  -consider the use of meditative movement therapy such as tai chi, yoga or qigong.  -start a regular daily exercise program based on tolerance, ability and patient choice to support positive thinking and activity    The following coping skill education was provided for stress relief and  mental health management: "When your car dies or a deadline looms, how do you respond? Long-term, low-grade or acute stress takes a serious toll on your body and mind, so don't ignore feelings of constant tension. Stress is a natural part of life. However, too much stress can harm our health, especially if it continues every day. This is chronic stress and can put you at risk for heart problems like heart disease and depression. Understand what's happening inside your body and learn simple coping skills to combat the negative impacts of everyday stressors.  Types of Stress There are two types of stress: Emotional - types of emotional stress are relationship problems, pressure at work, financial worries, experiencing discrimination or having a major life change. Physical - Examples of physical stress include being sick having pain, not sleeping well, recovery from an injury or having an alcohol and drug use disorder. Fight or Flight Sudden or ongoing stress activates your nervous system and floods your bloodstream with adrenaline and cortisol, two hormones that raise blood pressure, increase heart rate and spike blood sugar. These changes pitch your body into a fight or flight response. That enabled our ancestors to outrun saber-toothed tigers, and it's helpful today for situations like dodging a car accident. But most modern chronic stressors, such as finances or a challenging relationship, keep your body in that heightened state, which hurts your health. Effects  of Too Much Stress If constantly under stress, most of [name] will eventually start to function less well.  Multiple studies link chronic stress to a higher risk of heart disease, stroke, depression, weight gain, memory loss and even premature death, so it's important to recognize the warning signals. Talk to your doctor about ways to manage stress if you're experiencing any of these symptoms: Prolonged periods of poor sleep. Regular, severe  headaches. Unexplained weight loss or gain. Feelings of isolation, withdrawal or worthlessness. Constant anger and irritability. Loss of interest in activities. Constant worrying or obsessive thinking. Excessive alcohol or drug use. Inability to concentrate.  10 Ways to Cope with Chronic Stress It's key to recognize stressful situations as they occur because it allows you to focus on managing how you react. We all need to know when to close our eyes and take a deep breath when we feel tension rising. Use these tips to prevent or reduce chronic stress. 1. Rebalance Work and Home All work and no play? If you're spending too much time at the office, intentionally put more dates in your calendar to enjoy time for fun, either alone or with others. 2. Get Regular Exercise Moving your body on a regular basis balances the nervous system and increases blood circulation, helping to flush out stress hormones. Even a daily 20-minute walk makes a difference. Any kind of exercise can lower stress and improve your mood ? just pick activities that you enjoy and make it a regular habit. 3. Eat Well and Limit Alcohol and Stimulants Alcohol, nicotine and caffeine may temporarily relieve stress but have negative health impacts and can make stress worse in the long run. Well-nourished bodies cope better, so start with a good breakfast, add more organic fruits and vegetables for a well-balanced diet, avoid processed foods and sugar, try herbal tea and drink more water. 4. Connect with Supportive People Talking face to face with another person releases hormones that reduce stress. Lean on those good listeners in your life. 5. [place] Time Do you enjoy gardening, reading, listening to music or some other creative pursuit? Engage in activities that bring you pleasure and joy; research shows that reduces stress by almost half and lowers your heart rate, too. 6. Practice Meditation, Stress Reduction or  Yoga Relaxation techniques activate a state of restfulness that counterbalances your body's fight-or-flight hormones. Even if this also means a 10-minute break in a long day: listen to music, read, go for a walk in nature, do a hobby, take a bath or spend time with a friend. Also consider doing a mindfulness exercise or try a daily deep breathing or imagery practice. Deep Breathing Slow, calm and deep breathing can help you relax. Try these steps to focus on your breathing and repeat as needed. Find a comfortable position and close your eyes. Exhale and drop your shoulders. Breathe in through your nose; fill your lungs and then your belly. Think of relaxing your body, quieting your mind and becoming calm and peaceful. Breathe out slowly through your nose, relaxing your belly. Think of releasing tension, pain, worries or distress. Repeat steps three and four until you feel relaxed. Imagery This involves using your mind to excite the senses -- sound, vision, smell, taste and feeling. This may help ease your stress. Begin by getting comfortable and then do some slow breathing. Imagine a place you love being at. It could be somewhere from your childhood, somewhere you vacationed or just a place in your imagination. Feel  how it is to be in the place you're imagining. Pay attention to the sounds, air, colors, and who is there with you. This is a place where you feel cared for and loved. All is well. You are safe. Take in all the smells, sounds, tastes and feelings. As you do, feel your body being nourished and healed. Feel the calm that surrounds you. Breathe in all the good. Breathe out any discomfort or tension. 7. Sleep Enough If you get less than seven to eight hours of sleep, your body won't tolerate stress as well as it could. If stress keeps you up at night, address the cause, and add extra meditation into your day to make up for the lost z's. Try to get seven to nine hours of sleep each night.  Make a regular bedtime schedule. Keep your room dark and cool. Try to avoid computers, TV, cell phones and tablets before bed. 8. Bond with Connections You Enjoy Go out for a coffee with a friend, chat with a neighbor, call a family member, visit with a clergy member, or even hang out with your pet. Clinical studies show that spending even a short time with a companion animal can cut anxiety levels almost in half. 9. Take a Vacation Getting away from it all can reset your stress tolerance by increasing your mental and emotional outlook, which makes you a happier, more productive person upon return. Leave your cellphone and laptop at home! 10. See a Counselor, Coach or Therapist If negative thoughts overwhelm your ability to make positive changes, it's time to seek professional help. Make an appointment today--your health and life are worth it."  If you are experiencing a Mental Health or [place] or need someone to talk to, please call the Suicide and Crisis Lifeline: 988    Patient Goals: Initial goal

## 2022-03-19 NOTE — Patient Outreach (Signed)
Medicaid Managed Care Social Work Note  03/19/2022 Name:  [name] [name] MRN:  [id number] DOB:  November 21, 1960  [name] [name] is an 61 y.o. year old female who is a primary patient of [name] Lamb, NP.  The Medicaid Managed Care Coordination team was consulted for assistance with:  [place] and Resources  [name] [name] was given information about Medicaid Managed Care Coordination team services today. [name] [name] Patient agreed to services and verbal consent obtained.  Engaged with patient  for by telephone forinitial visit in response to referral for case management and/or care coordination services.   Assessments/Interventions:  Review of past medical history, allergies, medications, health status, including review of consultants reports, laboratory and other test data, was performed as part of comprehensive evaluation and provision of chronic care management services.  SDOH: (Social Determinant of Health) assessments and interventions performed: SDOH Interventions    Flowsheet Row Patient Outreach Telephone from 03/19/2022 in [facility] Patient Outreach Telephone from 03/17/2022 in [facility] ED to Hosp-Admission (Discharged) from 12/27/2021 in [facility] Office Visit from 03/31/2016 in [place] Office Visit from 08/29/2015 in [facility]  SDOH Interventions       Food Insecurity Interventions -- -- Intervention Not Indicated -- --  Housing Interventions Intervention Not Indicated Intervention Not Indicated Intervention Not Indicated -- --  Transportation Interventions -- Intervention Not Indicated Intervention Not Indicated -- --  Utilities Interventions -- -- Intervention Not Indicated -- --  Depression Interventions/Treatment  Referral to Psychiatry Currently on Treatment  [Referral  to LCSW] -- Medication, Currently on Treatment Currently on Treatment       Advanced Directives Status:  See Care Plan for related entries.  Care Plan                 Allergies  Allergen Reactions   Cephalexin Hives   Codeine Palpitations, Nausea Only, Nausea And Vomiting, Rash and Shortness Of Breath    "makes heart fly, she gets flushed and passes out"   Doxycycline Rash   Propoxyphene Rash and Shortness Of Breath    Increase heart rate   Sulfa Antibiotics Palpitations, Nausea Only, Shortness Of Breath and Hives    "makes heart fly, she gets flushed and passes out"   Clindamycin Rash    Tongue swelling, oral sores, and Mouth rash   Lovenox [Enoxaparin Sodium] Hives   Hydrocodone Nausea And Vomiting    Hear racing & breaks out into a cold sweat.   Meropenem Rash    Erythematous, hot, pruritic rash over arms, chest, back, abdomen, and face occurred at the end of meropenem infusion on 02/22/18    Medications Reviewed Today     Reviewed by [name] Cutter, LCSW (Social Worker) on 03/19/22 at [place] List Status: <None>   Medication Order Taking? Sig Documenting Provider Last Dose Status Informant  acetaminophen (TYLENOL) 325 MG tablet [id number] No Take 2 tablets (650 mg total) by mouth every 6 (six) hours as needed for mild pain or moderate pain (or Fever >/= 101). [name] Noble [place], DO Taking Active Self  amLODipine (NORVASC) 10 MG tablet [id number] No Take 10 mg by mouth daily. [provider] Taking Active Self  ANORO ELLIPTA 62.5-25 MCG/ACT AEPB [id number] No 1 puff daily.  Patient not taking: Reported on 03/01/2022   [provider] Not Taking Active Self  ARIPiprazole (ABILIFY) 5  MG tablet [id number] No Take 5 mg by mouth daily. [provider] Taking Active Self  ascorbic acid (VITAMIN C) 500 MG tablet [id number] No Take 1 tablet (500 mg total) by mouth daily. [name] Hamman, MD Taking Active Self  atorvastatin (LIPITOR) 80 MG tablet [id number]  No Take 80 mg by mouth daily. [provider] Taking Active Self  Budeson-Glycopyrrol-Formoterol (BREZTRI AEROSPHERE) 160-9-4.8 MCG/ACT AERO [id number] No Inhale 2 puffs into the lungs in the morning and at bedtime. [name] Pita, MD Taking Active Self  busPIRone (BUSPAR) 10 MG tablet [id number] No Take 1 tablet (10 mg total) by mouth 2 (two) times daily. Clapacs, Madie Reno, MD Taking Active Self  BUTRANS 10 MCG/HR PTWK [id number] No PLACE 1 PATCH ONTO THE SKIN ONCE A WEEK FOR 28 DAYS. [provider] Taking Active Self           Med Note [name] [name] Mar 01, 2022 11:31 AM) New patch due today, per Patient  Calcium Citrate-Vitamin D3 (GNP CALCIUM CITRATE+D MAXIMUM) 315-6.25 MG-MCG TABS [id number] No Take 1 tablet by mouth daily. [provider] Taking Active Self  Cholecalciferol (VITAMIN D) 125 MCG (5000 UT) CAPS [id number] No Take 1 capsule by mouth daily. [provider] Taking Active Self  ELIQUIS 5 MG TABS tablet [id number] No TAKE 1 TABLET BY MOUTH TWICE DAILY  Patient taking differently: Take 5 mg by mouth 2 (two) times daily.   [name] Pita, MD Taking Active Self  famotidine (PEPCID) 20 MG tablet [id number] No Take 1 tablet (20 mg total) by mouth daily. [name] Hamman, MD Taking Active Self  FLUoxetine (PROZAC) 40 MG capsule [id number] No Take 1 capsule (40 mg total) by mouth daily. Clapacs, Madie Reno, MD Taking Active Self  folic acid (FOLVITE) 1 MG tablet [id number] No Take 1 mg by mouth daily.  [provider] Taking Active Self  hydroxychloroquine (PLAQUENIL) 200 MG tablet [id number] No Take 200 mg by mouth 2 (two) times daily. [provider] Taking Active Self  levothyroxine (SYNTHROID, LEVOTHROID) 88 MCG tablet [id number] No Take 88 mcg by mouth daily before breakfast. [provider] Taking Active Self  metFORMIN (GLUCOPHAGE-XR) 500 MG 24 hr tablet [id number] No Take 500 mg by mouth daily. [provider]  Taking Active Self  metoCLOPramide (REGLAN) 5 MG/5ML solution [id number] No Take 5 mg by mouth 4 (four) times daily -  before meals and at bedtime. [provider] Taking Active Self           Med Note (ROBB, MELANIE A   Wed Mar 17, 2022  9:19 AM) Taking 10 mg 4 times daily  metoprolol tartrate (LOPRESSOR) 25 MG tablet [id number] No Take 25 mg by mouth 2 (two) times daily. [provider] Taking Active Self  oxyCODONE-acetaminophen (PERCOCET/ROXICET) 5-325 MG tablet [id number] No Take 1 tablet by mouth every 8 (eight) hours as needed for severe pain.  Patient not taking: Reported on 03/17/2022   [provider] Not Taking Active Self  pramipexole (MIRAPEX) 0.125 MG tablet [id number] No Take 0.25 mg by mouth at bedtime.  [provider] Taking Active Self  pregabalin (LYRICA) 100 MG capsule [id number] No Take 1 capsule (100 mg total) by mouth 3 (three) times daily. Gillis Santa, MD Taking Active Self  promethazine (PHENERGAN) 12.5 MG tablet [id number] No Take 12.5 mg by mouth. [provider] Taking Active Self  QUEtiapine (SEROQUEL) 300 MG tablet [id number] No TAKE ONE TABLET BY MOUTH AT BEDTIME.  Clapacs, Madie Reno, MD Taking Active Self  TRADJENTA 5 MG TABS tablet [id number] No Take 5 mg by mouth daily. [provider] Taking Active Self  zinc sulfate 220 (50 Zn) MG capsule [id number] No Take 220 mg by mouth at bedtime.  [provider] Taking Active Self  zolpidem (AMBIEN) 5 MG tablet [id number] No Take 1 tablet (5 mg total) by mouth at bedtime. Gonzella Lex, MD Taking Active Self            Patient Active Problem List   Diagnosis Date Noted   Gastroparesis 02/28/2022   Allergy to multiple antibiotics 02/28/2022   Lactic acidosis 01/05/2022   Sepsis ([place]) 01/04/2022   Acute cystitis 12/27/2021   Morbid obesity ([place])    Depression with anxiety    PAF (paroxysmal atrial fibrillation) ([place])    Chronic respiratory failure with  hypoxia ([place]) 02/07/2020   Paroxysmal A-fib ([place])    Morbid obesity with BMI of 50.0-59.9, adult ([place]) 07/08/2019   Controlled type 2 diabetes mellitus without complication, without long-term current use of insulin ([place]) 04/06/2019   CKD (chronic kidney disease), stage III ([place]) 04/06/2019   HLD (hyperlipidemia) 04/06/2019   AKI (acute kidney injury) ([place]) 02/12/2019   Diabetic ulcer of left foot ([place]) 02/14/2018   Chronic pain syndrome 03/31/2016   Rheumatoid arthritis ([place]) 12/25/2015   Osteoarthritis, multiple sites 12/24/2015   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[employer]) 12/23/2015   Long term current use of opiate analgesic 12/23/2015   Long term prescription opiate use 12/23/2015   Diabetic peripheral neuropathy ([place]) 12/23/2015   Chronic diastolic CHF (congestive heart failure) ([place]) 07/03/2014   COPD (chronic obstructive pulmonary disease) ([place]) 01/03/2014   Bipolar disorder, unspecified ([place]) 01/03/2014   Borderline personality disorder ([place]) 01/06/2012   Hypothyroidism 06/28/2010   Rheumatoid arthritis involving multiple sites with positive rheumatoid factor ([place]) 06/28/2010   Essential (primary) hypertension 01/27/2005   Major depressive disorder, recurrent episode, moderate ([place]) 06/03/2004    Conditions to be addressed/monitored per PCP order:  Anxiety, Depression, and Bipolar Disorder  Care Plan : LCSW Plan of Care  Updates made by [name] Cutter, LCSW since 03/19/2022 12:00 AM     Problem: Depression Identification (Depression)      Long-Range Goal: Depressive Symptoms Identified   Start Date: 03/19/2022  Priority: High  Note:   Priority: High  Timeframe:  Long-Range Goal Priority:  High Start Date:   03/19/22           Expected End Date:  ongoing                     Follow Up Date--03/26/22 at 1 pm  - keep 90 percent of scheduled appointments -consider counseling or psychiatry -consider bumping up your self-care  -consider creating a  stronger support network   Why is this important?             Beating depression may take some time.            If you don't feel better right away, don't give up on your treatment plan.    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 Patient requires Support, Education, Resources, Referrals, Advocacy, and Care Coordination, in order to meet Unmet Mental Health Needs. Patient will implement clinical interventions discussed today to decrease symptoms and increase knowledge and/or ability of: coping skills. Mental Health Concerns and Social Isolation Patient lacks knowledge of available community counseling  agencies and resources.  Clinical Goal(s): verbalize understanding of plan for management of Anxiety, Depression, and Stress and demonstrate a reduction in symptoms. Patient will connect with a provider for ongoing mental health treatment, increase coping skills, healthy habits, self-management skills, and stress reduction        Clinical Interventions:  Assessed patient's previous and current treatment, coping skills, support system and barriers to care. Patient provided hx  Verbalization of feelings encouraged, motivational interviewing employed Emotional support provided, positive coping strategies explored. Establishing healthy boundaries emphasized and healthy self-care education provided Patient was educated on available mental health resources within their area that accept Medicaid and offer counseling and psychiatry. Patient reports that she is on psychotropics already but needs additional mental health follow up due to added stressors.  Patient was successful in identifying triggers to depression symptoms, in addition, to healthy coping skills. Patient reports significant worsening mood impacting her ability to function appropriately and carry out daily task. Patient is agreeable to referral to [facility] for counseling and psychiatry. [facility] LCSW  made referral on 03/19/22. LCSW provided education on relaxation techniques such as meditation, deep breathing, massage, grounding exercises or yoga that can activate the body's relaxation response and ease symptoms of stress and anxiety. LCSW ask that when pt is struggling with difficult emotions and racing thoughts that they start this relaxation response process. LCSW provided extensive education on healthy coping skills for anxiety. SW used active and reflective listening, validated patient's feelings/concerns, and provided emotional support. Patient will work on implementing appropriate self-care habits into their daily routine such as: staying positive, writing a gratitude list, drinking water, staying active around the house, taking their medications as prescribed, combating negative thoughts or emotions and staying connected with their family and friends. Positive reinforcement provided for this decision to work on this. LCSW provided education on healthy sleep hygiene and what that looks like. LCSW encouraged patient to implement a night time routine into their schedule that works best for them and that they are able to maintain. Advised patient to implement deep breathing/grounding/meditation/self-care exercises into their nightly routine to combat racing thoughts at night. LCSW encouraged patient to wake up at the same time each day, make their sleeping environment comfortable, exercise when able, to limit naps and to not eat or drink anything right before bed.  Motivational Interviewing employed Depression screen reviewed  PHQ2/ PHQ9 completed or reviewed  Mindfulness or Relaxation training provided Active listening / Reflection utilized  Advance Care and HCPOA education provided Emotional Support Provided Problem [place] strategies reviewed Provided psychoeducation for mental health needs  Provided brief CBT  Reviewed mental health medications and discussed importance of  compliance:  Quality of sleep assessed & Sleep Hygiene techniques promoted  Participation in counseling encouraged  Verbalization of feelings encouraged  Suicidal Ideation/Homicidal Ideation assessed: Patient denies SI/HI  Review resources, discussed options and provided patient information about  [place] care team collaboration (see longitudinal plan of care) Patient Goals/Self-Care Activities: Over the next 120 days Attend scheduled medical appointments Utilize healthy coping skills and supportive resources discussed Contact PCP with any questions or concerns Keep 90 percent of counseling appointments Call your insurance provider for more information about your Enhanced Benefits  Check out counseling resources provided  Begin personal counseling with LCSW, to reduce and manage symptoms of Depression and Stress, until well-established with mental health provider Accept all calls from representative with [employer] in an effort to establish ongoing mental health counseling and supportive services. Incorporate  into daily practice - relaxation techniques, deep breathing exercises, and mindfulness meditation strategies. Talk about feelings with friends, family members, spiritual advisor, etc. Contact LCSW directly [phone]), if you have questions, need assistance, or if additional social work needs are identified between now and our next scheduled telephone outreach call. Call [phone] for mental health hotline/crisis line if needed (24/7 available) Try techniques to reduce symptoms of anxiety/negative thinking (deep breathing, distraction, positive self talk, etc)  - develop a personal safety plan - develop a plan to deal with triggers like holidays, anniversaries - exercise at least 2 to 3 times per week - have a plan for how to handle bad days - journal feelings and what helps to feel better or worse - spend time or talk with others at least 2 to 3 times per  week - watch for early signs of feeling worse - begin personal counseling - call and visit an old friend - check out volunteer opportunities - join a support group - laugh; watch a funny movie or comedian - learn and use visualization or guided imagery - perform a random act of kindness - practice relaxation or meditation daily - start or continue a personal journal - practice positive thinking and self-talk -continue with compliance of taking medication  -identify current effective and ineffective coping strategies.  -implement positive self-talk in care to increase self-esteem, confidence and feelings of control.  -consider alternative and complementary therapy approaches such as meditation, mindfulness or yoga.  -journaling, prayer, worship services, meditation or pastoral counseling.  -increase participation in pleasurable group activities such as hobbies, singing, sports or volunteering).  -consider the use of meditative movement therapy such as tai chi, yoga or qigong.  -start a regular daily exercise program based on tolerance, ability and patient choice to support positive thinking and activity    The following coping skill education was provided for stress relief and mental health management: "When your car dies or a deadline looms, how do you respond? Long-term, low-grade or acute stress takes a serious toll on your body and mind, so don't ignore feelings of constant tension. Stress is a natural part of life. However, too much stress can harm our health, especially if it continues every day. This is chronic stress and can put you at risk for heart problems like heart disease and depression. Understand what's happening inside your body and learn simple coping skills to combat the negative impacts of everyday stressors.  Types of Stress There are two types of stress: Emotional - types of emotional stress are relationship problems, pressure at work, financial worries, experiencing  discrimination or having a major life change. Physical - Examples of physical stress include being sick having pain, not sleeping well, recovery from an injury or having an alcohol and drug use disorder. Fight or Flight Sudden or ongoing stress activates your nervous system and floods your bloodstream with adrenaline and cortisol, two hormones that raise blood pressure, increase heart rate and spike blood sugar. These changes pitch your body into a fight or flight response. That enabled our ancestors to outrun saber-toothed tigers, and it's helpful today for situations like dodging a car accident. But most modern chronic stressors, such as finances or a challenging relationship, keep your body in that heightened state, which hurts your health. Effects of Too Much Stress If constantly under stress, most of [name] will eventually start to function less well.  Multiple studies link chronic stress to a higher risk of heart disease, stroke, depression, weight gain, memory  loss and even premature death, so it's important to recognize the warning signals. Talk to your doctor about ways to manage stress if you're experiencing any of these symptoms: Prolonged periods of poor sleep. Regular, severe headaches. Unexplained weight loss or gain. Feelings of isolation, withdrawal or worthlessness. Constant anger and irritability. Loss of interest in activities. Constant worrying or obsessive thinking. Excessive alcohol or drug use. Inability to concentrate.  10 Ways to Cope with Chronic Stress It's key to recognize stressful situations as they occur because it allows you to focus on managing how you react. We all need to know when to close our eyes and take a deep breath when we feel tension rising. Use these tips to prevent or reduce chronic stress. 1. Rebalance Work and Home All work and no play? If you're spending too much time at the office, intentionally put more dates in your calendar to enjoy time for fun,  either alone or with others. 2. Get Regular Exercise Moving your body on a regular basis balances the nervous system and increases blood circulation, helping to flush out stress hormones. Even a daily 20-minute walk makes a difference. Any kind of exercise can lower stress and improve your mood ? just pick activities that you enjoy and make it a regular habit. 3. Eat Well and Limit Alcohol and Stimulants Alcohol, nicotine and caffeine may temporarily relieve stress but have negative health impacts and can make stress worse in the long run. Well-nourished bodies cope better, so start with a good breakfast, add more organic fruits and vegetables for a well-balanced diet, avoid processed foods and sugar, try herbal tea and drink more water. 4. Connect with Supportive People Talking face to face with another person releases hormones that reduce stress. Lean on those good listeners in your life. 5. [place] Time Do you enjoy gardening, reading, listening to music or some other creative pursuit? Engage in activities that bring you pleasure and joy; research shows that reduces stress by almost half and lowers your heart rate, too. 6. Practice Meditation, Stress Reduction or Yoga Relaxation techniques activate a state of restfulness that counterbalances your body's fight-or-flight hormones. Even if this also means a 10-minute break in a long day: listen to music, read, go for a walk in nature, do a hobby, take a bath or spend time with a friend. Also consider doing a mindfulness exercise or try a daily deep breathing or imagery practice. Deep Breathing Slow, calm and deep breathing can help you relax. Try these steps to focus on your breathing and repeat as needed. Find a comfortable position and close your eyes. Exhale and drop your shoulders. Breathe in through your nose; fill your lungs and then your belly. Think of relaxing your body, quieting your mind and becoming calm and peaceful. Breathe out  slowly through your nose, relaxing your belly. Think of releasing tension, pain, worries or distress. Repeat steps three and four until you feel relaxed. Imagery This involves using your mind to excite the senses -- sound, vision, smell, taste and feeling. This may help ease your stress. Begin by getting comfortable and then do some slow breathing. Imagine a place you love being at. It could be somewhere from your childhood, somewhere you vacationed or just a place in your imagination. Feel how it is to be in the place you're imagining. Pay attention to the sounds, air, colors, and who is there with you. This is a place where you feel cared for and loved. All is  well. You are safe. Take in all the smells, sounds, tastes and feelings. As you do, feel your body being nourished and healed. Feel the calm that surrounds you. Breathe in all the good. Breathe out any discomfort or tension. 7. Sleep Enough If you get less than seven to eight hours of sleep, your body won't tolerate stress as well as it could. If stress keeps you up at night, address the cause, and add extra meditation into your day to make up for the lost z's. Try to get seven to nine hours of sleep each night. Make a regular bedtime schedule. Keep your room dark and cool. Try to avoid computers, TV, cell phones and tablets before bed. 8. Bond with Connections You Enjoy Go out for a coffee with a friend, chat with a neighbor, call a family member, visit with a clergy member, or even hang out with your pet. Clinical studies show that spending even a short time with a companion animal can cut anxiety levels almost in half. 9. Take a Vacation Getting away from it all can reset your stress tolerance by increasing your mental and emotional outlook, which makes you a happier, more productive person upon return. Leave your cellphone and laptop at home! 10. See a Counselor, Coach or Therapist If negative thoughts overwhelm your ability to make  positive changes, it's time to seek professional help. Make an appointment today--your health and life are worth it."  If you are experiencing a Mental Health or [place] or need someone to talk to, please call the Suicide and Crisis Lifeline: 988    Patient Goals: Initial goal       Follow up:  Patient agrees to Care Plan and Follow-up.  Plan: The Managed Medicaid care management team will reach out to the patient again over the next 7 days.  Date/time of next scheduled Social Work care management/care coordination outreach:  03/26/22 at 1 pm  Eula Fried, [place], MSW, [place] Medicaid LCSW [email or web address] Phone: [phone]

## 2022-03-20 MED ORDER — METOCLOPRAMIDE 5 MG/5 ML ORAL SOLUTION
3 refills | 0 days
Start: 2022-03-20 — End: ?

## 2022-03-22 ENCOUNTER — Ambulatory Visit: Admit: 2022-03-22 | Discharge: 2022-03-22 | Payer: PRIVATE HEALTH INSURANCE

## 2022-03-22 ENCOUNTER — Ambulatory Visit
Admit: 2022-03-22 | Discharge: 2022-03-22 | Payer: PRIVATE HEALTH INSURANCE | Attending: Student in an Organized Health Care Education/Training Program | Primary: Student in an Organized Health Care Education/Training Program

## 2022-03-22 DIAGNOSIS — M0579 Rheumatoid arthritis with rheumatoid factor of multiple sites without organ or systems involvement: Principal | ICD-10-CM

## 2022-03-22 MED ORDER — FOLIC ACID 1 MG TABLET
ORAL_TABLET | Freq: Every day | ORAL | 3 refills | 90 days | Status: CP
Start: 2022-03-22 — End: ?

## 2022-03-22 MED ORDER — METHOTREXATE SODIUM 2.5 MG TABLET
ORAL_TABLET | ORAL | 2 refills | 28 days | Status: CP
Start: 2022-03-22 — End: ?

## 2022-03-26 ENCOUNTER — Other Ambulatory Visit: Payer: Medicaid Other | Admitting: Licensed Clinical Social Worker

## 2022-03-26 ENCOUNTER — Other Ambulatory Visit: Payer: Self-pay | Admitting: Student in an Organized Health Care Education/Training Program

## 2022-03-26 ENCOUNTER — Ambulatory Visit: Payer: Medicaid Other | Admitting: Licensed Clinical Social Worker

## 2022-03-26 DIAGNOSIS — M792 Neuralgia and neuritis, unspecified: Secondary | ICD-10-CM

## 2022-03-26 DIAGNOSIS — F418 Other specified anxiety disorders: Secondary | ICD-10-CM

## 2022-03-26 DIAGNOSIS — E1142 Type 2 diabetes mellitus with diabetic polyneuropathy: Secondary | ICD-10-CM

## 2022-03-26 DIAGNOSIS — G8929 Other chronic pain: Secondary | ICD-10-CM

## 2022-03-26 DIAGNOSIS — F331 Major depressive disorder, recurrent, moderate: Secondary | ICD-10-CM

## 2022-03-26 DIAGNOSIS — F603 Borderline personality disorder: Secondary | ICD-10-CM

## 2022-03-26 DIAGNOSIS — G894 Chronic pain syndrome: Secondary | ICD-10-CM

## 2022-03-26 NOTE — Patient Outreach (Signed)
Medicaid Managed Care Social Work Note  03/26/2022 Name:  [name] [name] MRN:  [id number] DOB:  10-02-1960  [name] [name] is an 61 y.o. year old female who is a primary patient of [name] Lamb, NP.  The Medicaid Managed Care Coordination team was consulted for assistance with:  [place] and Resources  [name] was given information about Medicaid Managed Care Coordination team services today. [name] [name] Patient agreed to services and verbal consent obtained.  Engaged with patient  for by telephone forfollow up visit in response to referral for case management and/or care coordination services.   Assessments/Interventions:  Review of past medical history, allergies, medications, health status, including review of consultants reports, laboratory and other test data, was performed as part of comprehensive evaluation and provision of chronic care management services.  SDOH: (Social Determinant of Health) assessments and interventions performed: SDOH Interventions    Flowsheet Row Patient Outreach Telephone from 03/26/2022 in [facility] Patient Outreach Telephone from 03/19/2022 in [facility] Patient Outreach Telephone from 03/17/2022 in [facility] ED to Hosp-Admission (Discharged) from 12/27/2021 in [facility] Office Visit from 03/31/2016 in [facility] Office Visit from 08/29/2015 in [place]  SDOH Interventions        Food Insecurity Interventions -- -- -- Intervention Not Indicated -- --  Housing Interventions -- Intervention Not Indicated Intervention Not Indicated Intervention Not Indicated -- --  Transportation Interventions -- -- Intervention Not Indicated Intervention Not Indicated -- --  Utilities Interventions -- -- -- Intervention Not Indicated  -- --  Depression Interventions/Treatment  -- Referral to Psychiatry Currently on Treatment  [Referral to LCSW] -- Medication, Currently on Treatment Currently on Treatment  Stress Interventions [employer], Provide Counseling -- -- -- -- --       Advanced Directives Status:  See Care Plan for related entries.  Care Plan                 Allergies  Allergen Reactions   Cephalexin Hives   Codeine Palpitations, Nausea Only, Nausea And Vomiting, Rash and Shortness Of Breath    "makes heart fly, she gets flushed and passes out"   Doxycycline Rash   Propoxyphene Rash and Shortness Of Breath    Increase heart rate   Sulfa Antibiotics Palpitations, Nausea Only, Shortness Of Breath and Hives    "makes heart fly, she gets flushed and passes out"   Clindamycin Rash    Tongue swelling, oral sores, and Mouth rash   Lovenox [Enoxaparin Sodium] Hives   Hydrocodone Nausea And Vomiting    Hear racing & breaks out into a cold sweat.   Meropenem Rash    Erythematous, hot, pruritic rash over arms, chest, back, abdomen, and face occurred at the end of meropenem infusion on 02/22/18    Medications Reviewed Today     Reviewed by [name] Cutter, LCSW (Social Worker) on 03/19/22 at [place] List Status: <None>   Medication Order Taking? Sig Documenting Provider Last Dose Status Informant  acetaminophen (TYLENOL) 325 MG tablet [id number] No Take 2 tablets (650 mg total) by mouth every 6 (six) hours as needed for mild pain or moderate pain (or Fever >/= 101). [name], DO Taking Active Self  amLODipine (NORVASC) 10 MG tablet [id number] No Take 10 mg by mouth daily. [provider] Taking Active  Self  ANORO ELLIPTA 62.5-25 MCG/ACT AEPB [id number] No 1 puff daily.  Patient not taking: Reported on 03/01/2022   [provider] Not Taking Active Self  ARIPiprazole (ABILIFY) 5 MG tablet [id number] No Take 5 mg by mouth daily. [provider] Taking  Active Self  ascorbic acid (VITAMIN C) 500 MG tablet [id number] No Take 1 tablet (500 mg total) by mouth daily. Lavina Hamman, MD Taking Active Self  atorvastatin (LIPITOR) 80 MG tablet [id number] No Take 80 mg by mouth daily. [provider] Taking Active Self  Budeson-Glycopyrrol-Formoterol (BREZTRI AEROSPHERE) 160-9-4.8 MCG/ACT AERO [id number] No Inhale 2 puffs into the lungs in the morning and at bedtime. Tyler Pita, MD Taking Active Self  busPIRone (BUSPAR) 10 MG tablet [id number] No Take 1 tablet (10 mg total) by mouth 2 (two) times daily. Clapacs, Madie Reno, MD Taking Active Self  BUTRANS 10 MCG/HR PTWK [id number] No PLACE 1 PATCH ONTO THE SKIN ONCE A WEEK FOR 28 DAYS. [provider] Taking Active Self           Med Note [name] Mar 01, 2022 11:31 AM) New patch due today, per Patient  Calcium Citrate-Vitamin D3 (GNP CALCIUM CITRATE+D MAXIMUM) 315-6.25 MG-MCG TABS [id number] No Take 1 tablet by mouth daily. [provider] Taking Active Self  Cholecalciferol (VITAMIN D) 125 MCG (5000 UT) CAPS [id number] No Take 1 capsule by mouth daily. [provider] Taking Active Self  ELIQUIS 5 MG TABS tablet [id number] No TAKE 1 TABLET BY MOUTH TWICE DAILY  Patient taking differently: Take 5 mg by mouth 2 (two) times daily.   Tyler Pita, MD Taking Active Self  famotidine (PEPCID) 20 MG tablet [id number] No Take 1 tablet (20 mg total) by mouth daily. Lavina Hamman, MD Taking Active Self  FLUoxetine (PROZAC) 40 MG capsule [id number] No Take 1 capsule (40 mg total) by mouth daily. Clapacs, Madie Reno, MD Taking Active Self  folic acid (FOLVITE) 1 MG tablet [id number] No Take 1 mg by mouth daily.  [provider] Taking Active Self  hydroxychloroquine (PLAQUENIL) 200 MG tablet [id number] No Take 200 mg by mouth 2 (two) times daily. [provider] Taking Active Self  levothyroxine (SYNTHROID, LEVOTHROID) 88 MCG tablet [id number] No Take  88 mcg by mouth daily before breakfast. [provider] Taking Active Self  metFORMIN (GLUCOPHAGE-XR) 500 MG 24 hr tablet [id number] No Take 500 mg by mouth daily. [provider] Taking Active Self  metoCLOPramide (REGLAN) 5 MG/5ML solution [id number] No Take 5 mg by mouth 4 (four) times daily -  before meals and at bedtime. [provider] Taking Active Self           Med Note (ROBB, MELANIE A   Wed Mar 17, 2022  9:19 AM) Taking 10 mg 4 times daily  metoprolol tartrate (LOPRESSOR) 25 MG tablet [id number] No Take 25 mg by mouth 2 (two) times daily. [provider] Taking Active Self  oxyCODONE-acetaminophen (PERCOCET/ROXICET) 5-325 MG tablet [id number] No Take 1 tablet by mouth every 8 (eight) hours as needed for severe pain.  Patient not taking: Reported on 03/17/2022   [provider] Not Taking Active Self  pramipexole (MIRAPEX) 0.125 MG tablet [id number] No Take 0.25 mg by mouth at bedtime.  [provider] Taking Active Self  pregabalin (LYRICA) 100 MG capsule [id number] No Take 1 capsule (100 mg total) by mouth 3 (three) times daily. Gillis Santa, MD Taking Active Self  promethazine (  PHENERGAN) 12.5 MG tablet [id number] No Take 12.5 mg by mouth. [provider] Taking Active Self  QUEtiapine (SEROQUEL) 300 MG tablet [id number] No TAKE ONE TABLET BY MOUTH AT BEDTIME. Clapacs, Madie Reno, MD Taking Active Self  TRADJENTA 5 MG TABS tablet [id number] No Take 5 mg by mouth daily. [provider] Taking Active Self  zinc sulfate 220 (50 Zn) MG capsule [id number] No Take 220 mg by mouth at bedtime.  [provider] Taking Active Self  zolpidem (AMBIEN) 5 MG tablet [id number] No Take 1 tablet (5 mg total) by mouth at bedtime. Gonzella Lex, MD Taking Active Self            Patient Active Problem List   Diagnosis Date Noted   Gastroparesis 02/28/2022   Allergy to multiple antibiotics 02/28/2022   Lactic acidosis 01/05/2022    Sepsis ([place]) 01/04/2022   Acute cystitis 12/27/2021   Morbid obesity ([place])    Depression with anxiety    PAF (paroxysmal atrial fibrillation) ([place])    Chronic respiratory failure with hypoxia ([place]) 02/07/2020   Paroxysmal A-fib ([place])    Morbid obesity with BMI of 50.0-59.9, adult ([place]) 07/08/2019   Controlled type 2 diabetes mellitus without complication, without long-term current use of insulin ([place]) 04/06/2019   CKD (chronic kidney disease), stage III ([place]) 04/06/2019   HLD (hyperlipidemia) 04/06/2019   AKI (acute kidney injury) ([place]) 02/12/2019   Diabetic ulcer of left foot ([place]) 02/14/2018   Chronic pain syndrome 03/31/2016   Rheumatoid arthritis ([place]) 12/25/2015   Osteoarthritis, multiple sites 12/24/2015   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[employer]) 12/23/2015   Long term current use of opiate analgesic 12/23/2015   Long term prescription opiate use 12/23/2015   Diabetic peripheral neuropathy ([place]) 12/23/2015   Chronic diastolic CHF (congestive heart failure) ([place]) 07/03/2014   COPD (chronic obstructive pulmonary disease) ([place]) 01/03/2014   Bipolar disorder, unspecified ([place]) 01/03/2014   Borderline personality disorder ([place]) 01/06/2012   Hypothyroidism 06/28/2010   Rheumatoid arthritis involving multiple sites with positive rheumatoid factor ([place]) 06/28/2010   Essential (primary) hypertension 01/27/2005   Major depressive disorder, recurrent episode, moderate ([employer]) 06/03/2004    Conditions to be addressed/monitored per PCP order:  Depression and Bipolar Disorder  Care Plan : LCSW Plan of Care  Updates made by [name] Cutter, LCSW since 03/26/2022 12:00 AM     Problem: Depression Identification (Depression)      Long-Range Goal: Depressive Symptoms Identified   Start Date: 03/19/2022  Priority: High  Note:   Priority: High  Timeframe:  Long-Range Goal Priority:  High Start Date:   03/19/22           Expected End Date:  ongoing                      Follow Up Date--04/15/22 at 1 pm  - keep 90 percent of scheduled appointments -consider counseling or psychiatry -consider bumping up your self-care  -consider creating a stronger support network   Why is this important?             Beating depression may take some time.            If you don't feel better right away, don't give up on your treatment plan.    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 Patient requires Support, Education, Resources, Referrals, Advocacy, and Care Coordination, in order to meet Unmet Mental Health Needs.  Patient will implement clinical interventions discussed today to decrease symptoms and increase knowledge and/or ability of: coping skills. Mental Health Concerns and Social Isolation Patient lacks knowledge of available community counseling agencies and resources.  Clinical Goal(s): verbalize understanding of plan for management of Anxiety, Depression, and Stress and demonstrate a reduction in symptoms. Patient will connect with a provider for ongoing mental health treatment, increase coping skills, healthy habits, self-management skills, and stress reduction        Clinical Interventions:  Assessed patient's previous and current treatment, coping skills, support system and barriers to care. Patient provided hx  Verbalization of feelings encouraged, motivational interviewing employed Emotional support provided, positive coping strategies explored. Establishing healthy boundaries emphasized and healthy self-care education provided Patient was educated on available mental health resources within their area that accept Medicaid and offer counseling and psychiatry. Patient reports that she is on psychotropics already but needs additional mental health follow up due to added stressors.  Patient was successful in identifying triggers to depression symptoms, in addition, to healthy coping skills. Patient reports  significant worsening mood impacting her ability to function appropriately and carry out daily task. Patient is agreeable to referral to [facility] for counseling and psychiatry. [facility] LCSW made referral on 03/19/22. LCSW provided education on relaxation techniques such as meditation, deep breathing, massage, grounding exercises or yoga that can activate the body's relaxation response and ease symptoms of stress and anxiety. LCSW ask that when pt is struggling with difficult emotions and racing thoughts that they start this relaxation response process. LCSW provided extensive education on healthy coping skills for anxiety. SW used active and reflective listening, validated patient's feelings/concerns, and provided emotional support. Patient will work on implementing appropriate self-care habits into their daily routine such as: staying positive, writing a gratitude list, drinking water, staying active around the house, taking their medications as prescribed, combating negative thoughts or emotions and staying connected with their family and friends. Positive reinforcement provided for this decision to work on this. LCSW provided education on healthy sleep hygiene and what that looks like. LCSW encouraged patient to implement a night time routine into their schedule that works best for them and that they are able to maintain. Advised patient to implement deep breathing/grounding/meditation/self-care exercises into their nightly routine to combat racing thoughts at night. LCSW encouraged patient to wake up at the same time each day, make their sleeping environment comfortable, exercise when able, to limit naps and to not eat or drink anything right before bed.  Motivational Interviewing employed Depression screen reviewed  PHQ2/ PHQ9 completed or reviewed  Mindfulness or Relaxation training provided Active listening / Reflection utilized  Advance Care and HCPOA education provided Emotional Support Provided Problem  [place] strategies reviewed Provided psychoeducation for mental health needs  Provided brief CBT  Reviewed mental health medications and discussed importance of compliance:  Quality of sleep assessed & Sleep Hygiene techniques promoted  Participation in counseling encouraged  Verbalization of feelings encouraged  Suicidal Ideation/Homicidal Ideation assessed: Patient denies SI/HI  Review resources, discussed options and provided patient information about  [place] care team collaboration (see longitudinal plan of care) 03/26/22 update- Patient and [facility] LCSW completed joint call to [facility] and was informed that patient will need an additional referral made in order to get both counseling and psychiatry. Referral adjustment made. Per [facility], referral has to be reviewed first before scheduling so patient is unable to schedule her intake appointment just yet. Email sent to patient with resource  contact information. [facility] staff stated that they are booked up until February of 2024.  Patient Goals/Self-Care Activities: Over the next 120 days Attend scheduled medical appointments Utilize healthy coping skills and supportive resources discussed Contact PCP with any questions or concerns Keep 90 percent of counseling appointments Call your insurance provider for more information about your Enhanced Benefits  Check out counseling resources provided  Begin personal counseling with LCSW, to reduce and manage symptoms of Depression and Stress, until well-established with mental health provider Accept all calls from representative with [facility] in an effort to establish ongoing mental health counseling and supportive services. Incorporate into daily practice - relaxation techniques, deep breathing exercises, and mindfulness meditation strategies. Talk about feelings with friends, family members, spiritual advisor, etc. Contact LCSW directly [phone]), if you have  questions, need assistance, or if additional social work needs are identified between now and our next scheduled telephone outreach call. Call [phone] for mental health hotline/crisis line if needed (24/7 available) Try techniques to reduce symptoms of anxiety/negative thinking (deep breathing, distraction, positive self talk, etc)  - develop a personal safety plan - develop a plan to deal with triggers like holidays, anniversaries - exercise at least 2 to 3 times per week - have a plan for how to handle bad days - journal feelings and what helps to feel better or worse - spend time or talk with others at least 2 to 3 times per week - watch for early signs of feeling worse - begin personal counseling - call and visit an old friend - check out volunteer opportunities - join a support group - laugh; watch a funny movie or comedian - learn and use visualization or guided imagery - perform a random act of kindness - practice relaxation or meditation daily - start or continue a personal journal - practice positive thinking and self-talk -continue with compliance of taking medication  -identify current effective and ineffective coping strategies.  -implement positive self-talk in care to increase self-esteem, confidence and feelings of control.  -consider alternative and complementary therapy approaches such as meditation, mindfulness or yoga.  -journaling, prayer, worship services, meditation or pastoral counseling.  -increase participation in pleasurable group activities such as hobbies, singing, sports or volunteering).  -consider the use of meditative movement therapy such as tai chi, yoga or qigong.  -start a regular daily exercise program based on tolerance, ability and patient choice to support positive thinking and activity    The following coping skill education was provided for stress relief and mental health management: "When your car dies or a deadline looms, how do you respond?  Long-term, low-grade or acute stress takes a serious toll on your body and mind, so don't ignore feelings of constant tension. Stress is a natural part of life. However, too much stress can harm our health, especially if it continues every day. This is chronic stress and can put you at risk for heart problems like heart disease and depression. Understand what's happening inside your body and learn simple coping skills to combat the negative impacts of everyday stressors.  Types of Stress There are two types of stress: Emotional - types of emotional stress are relationship problems, pressure at work, financial worries, experiencing discrimination or having a major life change. Physical - Examples of physical stress include being sick having pain, not sleeping well, recovery from an injury or having an alcohol and drug use disorder. Fight or Flight Sudden or ongoing stress activates your nervous system and floods your bloodstream with  adrenaline and cortisol, two hormones that raise blood pressure, increase heart rate and spike blood sugar. These changes pitch your body into a fight or flight response. That enabled our ancestors to outrun saber-toothed tigers, and it's helpful today for situations like dodging a car accident. But most modern chronic stressors, such as finances or a challenging relationship, keep your body in that heightened state, which hurts your health. Effects of Too Much Stress If constantly under stress, most of [name] will eventually start to function less well.  Multiple studies link chronic stress to a higher risk of heart disease, stroke, depression, weight gain, memory loss and even premature death, so it's important to recognize the warning signals. Talk to your doctor about ways to manage stress if you're experiencing any of these symptoms: Prolonged periods of poor sleep. Regular, severe headaches. Unexplained weight loss or gain. Feelings of isolation, withdrawal or  worthlessness. Constant anger and irritability. Loss of interest in activities. Constant worrying or obsessive thinking. Excessive alcohol or drug use. Inability to concentrate.  10 Ways to Cope with Chronic Stress It's key to recognize stressful situations as they occur because it allows you to focus on managing how you react. We all need to know when to close our eyes and take a deep breath when we feel tension rising. Use these tips to prevent or reduce chronic stress. 1. Rebalance Work and Home All work and no play? If you're spending too much time at the office, intentionally put more dates in your calendar to enjoy time for fun, either alone or with others. 2. Get Regular Exercise Moving your body on a regular basis balances the nervous system and increases blood circulation, helping to flush out stress hormones. Even a daily 20-minute walk makes a difference. Any kind of exercise can lower stress and improve your mood ? just pick activities that you enjoy and make it a regular habit. 3. Eat Well and Limit Alcohol and Stimulants Alcohol, nicotine and caffeine may temporarily relieve stress but have negative health impacts and can make stress worse in the long run. Well-nourished bodies cope better, so start with a good breakfast, add more organic fruits and vegetables for a well-balanced diet, avoid processed foods and sugar, try herbal tea and drink more water. 4. Connect with Supportive People Talking face to face with another person releases hormones that reduce stress. Lean on those good listeners in your life. 5. [place] Time Do you enjoy gardening, reading, listening to music or some other creative pursuit? Engage in activities that bring you pleasure and joy; research shows that reduces stress by almost half and lowers your heart rate, too. 6. Practice Meditation, Stress Reduction or Yoga Relaxation techniques activate a state of restfulness that counterbalances your body's  fight-or-flight hormones. Even if this also means a 10-minute break in a long day: listen to music, read, go for a walk in nature, do a hobby, take a bath or spend time with a friend. Also consider doing a mindfulness exercise or try a daily deep breathing or imagery practice. Deep Breathing Slow, calm and deep breathing can help you relax. Try these steps to focus on your breathing and repeat as needed. Find a comfortable position and close your eyes. Exhale and drop your shoulders. Breathe in through your nose; fill your lungs and then your belly. Think of relaxing your body, quieting your mind and becoming calm and peaceful. Breathe out slowly through your nose, relaxing your belly. Think of releasing tension, pain,  worries or distress. Repeat steps three and four until you feel relaxed. Imagery This involves using your mind to excite the senses -- sound, vision, smell, taste and feeling. This may help ease your stress. Begin by getting comfortable and then do some slow breathing. Imagine a place you love being at. It could be somewhere from your childhood, somewhere you vacationed or just a place in your imagination. Feel how it is to be in the place you're imagining. Pay attention to the sounds, air, colors, and who is there with you. This is a place where you feel cared for and loved. All is well. You are safe. Take in all the smells, sounds, tastes and feelings. As you do, feel your body being nourished and healed. Feel the calm that surrounds you. Breathe in all the good. Breathe out any discomfort or tension. 7. Sleep Enough If you get less than seven to eight hours of sleep, your body won't tolerate stress as well as it could. If stress keeps you up at night, address the cause, and add extra meditation into your day to make up for the lost z's. Try to get seven to nine hours of sleep each night. Make a regular bedtime schedule. Keep your room dark and cool. Try to avoid computers, TV, cell  phones and tablets before bed. 8. Bond with Connections You Enjoy Go out for a coffee with a friend, chat with a neighbor, call a family member, visit with a clergy member, or even hang out with your pet. Clinical studies show that spending even a short time with a companion animal can cut anxiety levels almost in half. 9. Take a Vacation Getting away from it all can reset your stress tolerance by increasing your mental and emotional outlook, which makes you a happier, more productive person upon return. Leave your cellphone and laptop at home! 10. See a Counselor, Coach or Therapist If negative thoughts overwhelm your ability to make positive changes, it's time to seek professional help. Make an appointment today--your health and life are worth it."  If you are experiencing a Mental Health or [place] or need someone to talk to, please call the Suicide and Crisis Lifeline: 988    Patient Goals: Follow up goal       Follow up:  Patient agrees to Care Plan and Follow-up.  Plan: The Managed Medicaid care management team will reach out to the patient again over the next 30 days.  Date/time of next scheduled Social Work care management/care coordination outreach:  04/15/22 at [place], [place], MSW, [place] Medicaid LCSW [email or web address] Phone: [phone]

## 2022-03-26 NOTE — Patient Instructions (Signed)
Visit Information  [name] [name] was given information about Medicaid Managed Care team caverbally consentedre coordination services as a part of their [place] Complete Medicaid benefit. [name] [name] verbally consented to engagement with the [facility] Managed Care team.   If you are experiencing a medical emergency, please call 911 or report to your local emergency department or urgent care.   If you have a non-emergency medical problem during routine business hours, please contact your provider's office and ask to speak with a nurse.   For questions related to your [place] Complete Medicaid health plan, please call: [phone]  If you would like to schedule transportation through your [place] Complete Medicaid plan, please call the following number at least 2 days in advance of your appointment: [phone].   There is no limit to the number of trips during the year between medical appointments, healthcare facilities, or pharmacies. Transportation must be scheduled at least 2 business days before but not more than thirty 30 days before of your appointment.  Call the [facility] at [phone], at any time, 24 hours a day, 7 days a week. If you are in danger or need immediate medical attention call 911.  If you would like help to quit smoking, call 1-800-QUIT-NOW 850-683-4454) OR Espaol: 1-855-Djelo-Ya (8-280-034-9179) o para ms informacin haga clic aqu or Text READY to 200-400 to register via text   Following is a copy of your plan of care:  Care Plan : LCSW Plan of Care  Updates made by [name], LCSW since 03/26/2022 12:00 AM     Problem: Depression Identification (Depression)      Long-Range Goal: Depressive Symptoms Identified   Start Date: 03/19/2022  Priority: High  Note:   Priority: High  Timeframe:  Long-Range Goal Priority:  High Start Date:   03/19/22           Expected End Date:  ongoing                     Follow Up Date--04/15/22  at 1 pm  - keep 90 percent of scheduled appointments -consider counseling or psychiatry -consider bumping up your self-care  -consider creating a stronger support network   Why is this important?             Beating depression may take some time.            If you don't feel better right away, don't give up on your treatment plan.    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 Patient requires Support, Education, Resources, Referrals, Advocacy, and Care Coordination, in order to meet Unmet Mental Health Needs. Patient will implement clinical interventions discussed today to decrease symptoms and increase knowledge and/or ability of: coping skills. Mental Health Concerns and Social Isolation Patient lacks knowledge of available community counseling agencies and resources.  Clinical Goal(s): verbalize understanding of plan for management of Anxiety, Depression, and Stress and demonstrate a reduction in symptoms. Patient will connect with a provider for ongoing mental health treatment, increase coping skills, healthy habits, self-management skills, and stress reduction        Patient Goals/Self-Care Activities: Over the next 120 days Attend scheduled medical appointments Utilize healthy coping skills and supportive resources discussed Contact PCP with any questions or concerns Keep 90 percent of counseling appointments Call your insurance provider for more information about your Enhanced Benefits  Check out counseling resources provided  Begin personal counseling  with LCSW, to reduce and manage symptoms of Depression and Stress, until well-established with mental health provider Accept all calls from representative with [employer] in an effort to establish ongoing mental health counseling and supportive services. Incorporate into daily practice - relaxation techniques, deep breathing exercises, and mindfulness meditation strategies. Talk about  feelings with friends, family members, spiritual advisor, etc. Contact LCSW directly [phone]), if you have questions, need assistance, or if additional social work needs are identified between now and our next scheduled telephone outreach call. Call 988 for mental health hotline/crisis line if needed (24/7 available) Try techniques to reduce symptoms of anxiety/negative thinking (deep breathing, distraction, positive self talk, etc)  - develop a personal safety plan - develop a plan to deal with triggers like holidays, anniversaries - exercise at least 2 to 3 times per week - have a plan for how to handle bad days - journal feelings and what helps to feel better or worse - spend time or talk with others at least 2 to 3 times per week - watch for early signs of feeling worse - begin personal counseling - call and visit an old friend - check out volunteer opportunities - join a support group - laugh; watch a funny movie or comedian - learn and use visualization or guided imagery - perform a random act of kindness - practice relaxation or meditation daily - start or continue a personal journal - practice positive thinking and self-talk -continue with compliance of taking medication  -identify current effective and ineffective coping strategies.  -implement positive self-talk in care to increase self-esteem, confidence and feelings of control.  -consider alternative and complementary therapy approaches such as meditation, mindfulness or yoga.  -journaling, prayer, worship services, meditation or pastoral counseling.  -increase participation in pleasurable group activities such as hobbies, singing, sports or volunteering).  -consider the use of meditative movement therapy such as tai chi, yoga or qigong.  -start a regular daily exercise program based on tolerance, ability and patient choice to support positive thinking and activity    The following coping skill education was provided  for stress relief and mental health management: "When your car dies or a deadline looms, how do you respond? Long-term, low-grade or acute stress takes a serious toll on your body and mind, so don't ignore feelings of constant tension. Stress is a natural part of life. However, too much stress can harm our health, especially if it continues every day. This is chronic stress and can put you at risk for heart problems like heart disease and depression. Understand what's happening inside your body and learn simple coping skills to combat the negative impacts of everyday stressors.  Types of Stress There are two types of stress: Emotional - types of emotional stress are relationship problems, pressure at work, financial worries, experiencing discrimination or having a major life change. Physical - Examples of physical stress include being sick having pain, not sleeping well, recovery from an injury or having an alcohol and drug use disorder. Fight or Flight Sudden or ongoing stress activates your nervous system and floods your bloodstream with adrenaline and cortisol, two hormones that raise blood pressure, increase heart rate and spike blood sugar. These changes pitch your body into a fight or flight response. That enabled our ancestors to outrun saber-toothed tigers, and it's helpful today for situations like dodging a car accident. But most modern chronic stressors, such as finances or a challenging relationship, keep your body in that heightened state, which hurts your health.  Effects of Too Much Stress If constantly under stress, most of [name] will eventually start to function less well.  Multiple studies link chronic stress to a higher risk of heart disease, stroke, depression, weight gain, memory loss and even premature death, so it's important to recognize the warning signals. Talk to your doctor about ways to manage stress if you're experiencing any of these symptoms: Prolonged periods of poor  sleep. Regular, severe headaches. Unexplained weight loss or gain. Feelings of isolation, withdrawal or worthlessness. Constant anger and irritability. Loss of interest in activities. Constant worrying or obsessive thinking. Excessive alcohol or drug use. Inability to concentrate.  10 Ways to Cope with Chronic Stress It's key to recognize stressful situations as they occur because it allows you to focus on managing how you react. We all need to know when to close our eyes and take a deep breath when we feel tension rising. Use these tips to prevent or reduce chronic stress. 1. Rebalance Work and Home All work and no play? If you're spending too much time at the office, intentionally put more dates in your calendar to enjoy time for fun, either alone or with others. 2. Get Regular Exercise Moving your body on a regular basis balances the nervous system and increases blood circulation, helping to flush out stress hormones. Even a daily 20-minute walk makes a difference. Any kind of exercise can lower stress and improve your mood ? just pick activities that you enjoy and make it a regular habit. 3. Eat Well and Limit Alcohol and Stimulants Alcohol, nicotine and caffeine may temporarily relieve stress but have negative health impacts and can make stress worse in the long run. Well-nourished bodies cope better, so start with a good breakfast, add more organic fruits and vegetables for a well-balanced diet, avoid processed foods and sugar, try herbal tea and drink more water. 4. Connect with Supportive People Talking face to face with another person releases hormones that reduce stress. Lean on those good listeners in your life. 5. [place] Time Do you enjoy gardening, reading, listening to music or some other creative pursuit? Engage in activities that bring you pleasure and joy; research shows that reduces stress by almost half and lowers your heart rate, too. 6. Practice Meditation, Stress  Reduction or Yoga Relaxation techniques activate a state of restfulness that counterbalances your body's fight-or-flight hormones. Even if this also means a 10-minute break in a long day: listen to music, read, go for a walk in nature, do a hobby, take a bath or spend time with a friend. Also consider doing a mindfulness exercise or try a daily deep breathing or imagery practice. Deep Breathing Slow, calm and deep breathing can help you relax. Try these steps to focus on your breathing and repeat as needed. Find a comfortable position and close your eyes. Exhale and drop your shoulders. Breathe in through your nose; fill your lungs and then your belly. Think of relaxing your body, quieting your mind and becoming calm and peaceful. Breathe out slowly through your nose, relaxing your belly. Think of releasing tension, pain, worries or distress. Repeat steps three and four until you feel relaxed. Imagery This involves using your mind to excite the senses -- sound, vision, smell, taste and feeling. This may help ease your stress. Begin by getting comfortable and then do some slow breathing. Imagine a place you love being at. It could be somewhere from your childhood, somewhere you vacationed or just a place in your imagination.  Feel how it is to be in the place you're imagining. Pay attention to the sounds, air, colors, and who is there with you. This is a place where you feel cared for and loved. All is well. You are safe. Take in all the smells, sounds, tastes and feelings. As you do, feel your body being nourished and healed. Feel the calm that surrounds you. Breathe in all the good. Breathe out any discomfort or tension. 7. Sleep Enough If you get less than seven to eight hours of sleep, your body won't tolerate stress as well as it could. If stress keeps you up at night, address the cause, and add extra meditation into your day to make up for the lost z's. Try to get seven to nine hours of sleep  each night. Make a regular bedtime schedule. Keep your room dark and cool. Try to avoid computers, TV, cell phones and tablets before bed. 8. Bond with Connections You Enjoy Go out for a coffee with a friend, chat with a neighbor, call a family member, visit with a clergy member, or even hang out with your pet. Clinical studies show that spending even a short time with a companion animal can cut anxiety levels almost in half. 9. Take a Vacation Getting away from it all can reset your stress tolerance by increasing your mental and emotional outlook, which makes you a happier, more productive person upon return. Leave your cellphone and laptop at home! 10. See a Counselor, Coach or Therapist If negative thoughts overwhelm your ability to make positive changes, it's time to seek professional help. Make an appointment today--your health and life are worth it."  If you are experiencing a Mental Health or [name] or need someone to talk to, please call the Suicide and Crisis Lifeline: 988    Patient Goals: Follow up goal

## 2022-03-29 ENCOUNTER — Encounter: Payer: Self-pay | Admitting: Pulmonary Disease

## 2022-03-29 ENCOUNTER — Ambulatory Visit (INDEPENDENT_AMBULATORY_CARE_PROVIDER_SITE_OTHER): Payer: Medicaid Other | Admitting: Pulmonary Disease

## 2022-03-29 VITALS — BP 126/84 | HR 58 | Temp 98.3°F | Ht 66.0 in | Wt 324.0 lb

## 2022-03-29 DIAGNOSIS — J8410 Pulmonary fibrosis, unspecified: Secondary | ICD-10-CM | POA: Diagnosis not present

## 2022-03-29 DIAGNOSIS — J4489 Other specified chronic obstructive pulmonary disease: Secondary | ICD-10-CM | POA: Diagnosis not present

## 2022-03-29 DIAGNOSIS — J9611 Chronic respiratory failure with hypoxia: Secondary | ICD-10-CM

## 2022-03-29 DIAGNOSIS — Z6841 Body Mass Index (BMI) 40.0 and over, adult: Secondary | ICD-10-CM

## 2022-03-29 DIAGNOSIS — M069 Rheumatoid arthritis, unspecified: Secondary | ICD-10-CM

## 2022-03-29 NOTE — Progress Notes (Signed)
Subjective:    Patient ID: [name] [name], female    DOB: 1960/09/20, 61 y.o.   MRN: [id number] Patient Care Team: [name] Nicks, NP as PCP - General [name] Bryant, LCSW as [facility] Care Management (Licensed Clinical Social Worker)  Chief Complaint  Patient presents with   Follow-up    SOB with exertion. No wheezing or cough.     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  Patient also has extreme obesity with obesity hypoventilation.  This is a scheduled visit.  Patient was last seen on 17 December 2021 at that time she was directed to continue Breztri 2 puffs twice a day and albuterol rescue i as needed.  She notes that [name] [name] helps her breathing significantly.  She is compliant with oxygen at 2-3 L/min.  Has not had any fevers, chills or sweats.  As noted, she notes improvement in her dyspnea and stamina on the [place].  Does not endorse any other symptomatology.  Cough is well-controlled.  Overall she feels she is doing well today.   DATA 07/08/2019 echocardiogram: LVEF 60 to 65%, grade 1 DD 01/23/2020 PFTs: FEV1 2.19 L or 78% predicted, FVC 2.59 L or 71% predicted, FEV1/FVC 84%, no bronchodilator response.  ERV 10% lung volumes otherwise normal diffusion capacity mildly to moderately impaired 11/03/2021 echocardiogram: LVEF 55 to 60%, grade 1 DD, LA size mildly to moderately dilated. 03/17/2022 PFTs: FEV1 1.61 L or 58% predicted, FVC 2.05 L or 57% of predicted FEV1/FVC 79% there is response to bronchodilators with regards to airway resistance.  Lung volumes moderately reduced, moderate to severe diffusion defect   Review of Systems A 10 point review of systems was performed and it is as noted above otherwise negative.  Patient  Active Problem List   Diagnosis Date Noted   Gastroparesis 02/28/2022   Allergy to multiple antibiotics 02/28/2022   Lactic acidosis 01/05/2022   Sepsis (HCC) 01/04/2022   Acute cystitis 12/27/2021   Morbid obesity (HCC)    Depression with anxiety    PAF (paroxysmal atrial fibrillation) (HCC)    Chronic respiratory failure with hypoxia (HCC) 02/07/2020   Paroxysmal A-fib (HCC)    Morbid obesity with BMI of 50.0-59.9, adult (HCC) 07/08/2019   Controlled type 2 diabetes mellitus without complication, without long-term current use of insulin (HCC) 04/06/2019   CKD (chronic kidney disease), stage III (HCC) 04/06/2019   HLD (hyperlipidemia) 04/06/2019   AKI (acute kidney injury) (HCC) 02/12/2019   Diabetic ulcer of left foot (HCC) 02/14/2018   Chronic pain syndrome 03/31/2016   Rheumatoid arthritis (HCC) 12/25/2015   Osteoarthritis, multiple sites 12/24/2015   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[employer]) 12/23/2015   Long term current use of opiate analgesic 12/23/2015   Long term prescription opiate use 12/23/2015   Diabetic peripheral neuropathy (HCC) 12/23/2015   Chronic diastolic CHF (congestive heart failure) (HCC) 07/03/2014   COPD (chronic obstructive pulmonary disease) (HCC) 01/03/2014   Bipolar disorder, unspecified (HCC) 01/03/2014   Borderline personality disorder (HCC) 01/06/2012   Hypothyroidism 06/28/2010   Rheumatoid arthritis involving multiple sites with positive rheumatoid factor (HCC) 06/28/2010   Essential (primary) hypertension 01/27/2005   Major depressive disorder, recurrent episode, moderate (HCC) 06/03/2004   Social History   Tobacco Use   Smoking status: Former  Packs/day: 2.00    Years: 27.00    Total pack years: 54.00    Types: Cigarettes    Quit date: 07/30/1999    Years since quitting: 22.6   Smokeless tobacco: Never   Tobacco comments:    quit in 2001 2-2.5 a day  Substance Use Topics   Alcohol use: No   Allergies   Allergen Reactions   Cephalexin Hives   Codeine Palpitations, Nausea Only, Nausea And Vomiting, Rash and Shortness Of Breath    "makes heart fly, she gets flushed and passes out"   Doxycycline Rash   Propoxyphene Rash and Shortness Of Breath    Increase heart rate   Sulfa Antibiotics Palpitations, Nausea Only, Shortness Of Breath and Hives    "makes heart fly, she gets flushed and passes out"   Clindamycin Rash    Tongue swelling, oral sores, and Mouth rash   Lovenox [Enoxaparin Sodium] Hives   Hydrocodone Nausea And Vomiting    Hear racing & breaks out into a cold sweat.   Meropenem Rash    Erythematous, hot, pruritic rash over arms, chest, back, abdomen, and face occurred at the end of meropenem infusion on 02/22/18   Current Meds  Medication Sig   acetaminophen (TYLENOL) 325 MG tablet Take 2 tablets (650 mg total) by mouth every 6 (six) hours as needed for mild pain or moderate pain (or Fever >/= 101).   amLODipine (NORVASC) 10 MG tablet Take 10 mg by mouth daily.   ARIPiprazole (ABILIFY) 5 MG tablet Take 5 mg by mouth daily.   ascorbic acid (VITAMIN C) 500 MG tablet Take 1 tablet (500 mg total) by mouth daily.   atorvastatin (LIPITOR) 80 MG tablet Take 80 mg by mouth daily.   Budeson-Glycopyrrol-Formoterol (BREZTRI AEROSPHERE) 160-9-4.8 MCG/ACT AERO Inhale 2 puffs into the lungs in the morning and at bedtime.   busPIRone (BUSPAR) 10 MG tablet Take 1 tablet (10 mg total) by mouth 2 (two) times daily.   BUTRANS 7.5 MCG/HR Place 1 patch onto the skin once a week.   Calcium Citrate-Vitamin D3 (GNP CALCIUM CITRATE+D MAXIMUM) 315-6.25 MG-MCG TABS Take 1 tablet by mouth daily.   Cholecalciferol (VITAMIN D) 125 MCG (5000 UT) CAPS Take 1 capsule by mouth daily.   ELIQUIS 5 MG TABS tablet TAKE 1 TABLET BY MOUTH TWICE DAILY (Patient taking differently: Take 5 mg by mouth 2 (two) times daily.)   famotidine (PEPCID) 20 MG tablet Take 1 tablet (20 mg total) by mouth daily.   FLUoxetine (PROZAC)  40 MG capsule Take 1 capsule (40 mg total) by mouth daily.   folic acid (FOLVITE) 1 MG tablet Take 1 mg by mouth daily.    hydroxychloroquine (PLAQUENIL) 200 MG tablet Take 200 mg by mouth 2 (two) times daily.   levothyroxine (SYNTHROID, LEVOTHROID) 88 MCG tablet Take 88 mcg by mouth daily before breakfast.   metFORMIN (GLUCOPHAGE-XR) 500 MG 24 hr tablet Take 500 mg by mouth daily.   methotrexate (RHEUMATREX) 2.5 MG tablet Take 2.5 mg by mouth once a week.   metoCLOPramide (REGLAN) 5 MG/5ML solution Take 5 mg by mouth 4 (four) times daily -  before meals and at bedtime.   metoprolol tartrate (LOPRESSOR) 25 MG tablet Take 25 mg by mouth 2 (two) times daily.   oxyCODONE-acetaminophen (PERCOCET/ROXICET) 5-325 MG tablet Take 1 tablet by mouth every 8 (eight) hours as needed for severe pain.   pramipexole (MIRAPEX) 0.5 MG tablet Take 0.5 mg by mouth at bedtime.   pregabalin (LYRICA)  100 MG capsule Take 1 capsule (100 mg total) by mouth 3 (three) times daily.   promethazine (PHENERGAN) 12.5 MG tablet Take 12.5 mg by mouth.   QUEtiapine (SEROQUEL) 300 MG tablet TAKE ONE TABLET BY MOUTH AT BEDTIME.   TRADJENTA 5 MG TABS tablet Take 5 mg by mouth daily.   zinc sulfate 220 (50 Zn) MG capsule Take 220 mg by mouth at bedtime.    zolpidem (AMBIEN) 5 MG tablet Take 1 tablet (5 mg total) by mouth at bedtime.   Immunization History  Administered Date(s) Administered   Covid-19, Mrna,Vaccine(Spikevax)5yrs and older 03/22/2022   Influenza, Seasonal, Injecte, Preservative Fre 02/04/2005   Influenza,inj,Quad PF,6+ Mos 04/14/2015, 03/23/2017, 02/07/2018, 02/01/2019, 02/07/2020, 02/08/2021, 03/22/2022   Influenza-Unspecified 02/02/2019   PFIZER(Purple Top)SARS-COV-2 Vaccination 03/18/2020, 04/08/2020, 06/03/2020   Pfizer Covid-19 Vaccine Bivalent Booster 42yrs & up 07/13/2021   Pneumococcal Conjugate-13 04/22/2014   Pneumococcal Polysaccharide-23 03/23/2017, 04/16/2019        Objective:   Physical  Exam BP 126/84 (BP Location: Left Wrist, Cuff Size: Normal)   Pulse (!) 58   Temp 98.3 F (36.8 C)   Ht 5\' 6"  (1.676 m)   Wt (!) 324 lb (147 kg) Comment: Per patient. She is in a wheelchair  LMP 04/20/2001   SpO2 96%   BMI 52.29 kg/m  GENERAL: Morbidly obese woman, presents in transport chair.  No respiratory distress.  Comfortable with nasal cannula O2 oxygen on.  No conversational dyspnea. HEAD: Normocephalic, atraumatic. EYES: Pupils equal, round, reactive to light.  No scleral icterus. MOUTH: Nose/mouth/throat not examined due to masking requirements for COVID 19. NECK: Supple. No thyromegaly. Trachea midline. No JVD.  No adenopathy. PULMONARY: Good air entry bilaterally.  She has faint end expiratory wheezes throughout.   CARDIOVASCULAR: S1 and S2. Regular rate and rhythm.  Grade 1/6 to 2/6 systolic ejection murmur left sternal border. ABDOMEN: Obese, soft, nondistended. MUSCULOSKELETAL: RA changes both hands.  No clubbing or edema noted, no increased warmth in the lower extremities. Chronic stasis changes but no overt edema. NEUROLOGIC: No overt focal deficit, speech is fluent. SKIN: Intact,warm,dry.  Chronic stasis changes lower extremities.   PSYCH: Mood and behavior normal.     Assessment & Plan:     ICD-10-CM   1. Chronic asthmatic bronchitis  J44.89 AMB referral to pulmonary rehabilitation    AMB REFERRAL FOR DME   Continue Breztri 2 puffs twice a day Continue as needed albuterol    2. Chronic respiratory failure with hypoxia (HCC)  J96.11 AMB referral to pulmonary rehabilitation    AMB REFERRAL FOR DME   Continue oxygen at 2 to 3 L/min Referral for POC    3. Pulmonary fibrosis, postinflammatory (HCC)  J84.10 AMB referral to pulmonary rehabilitation    AMB REFERRAL FOR DME   Referral to pulmonary rehab    4. Rheumatoid arthritis  M06.9    This issue adds complexity to her management No recent flares Likely added to the issue of pulmonary fibrosis    5. Morbid  obesity with BMI of 50.0-59.9, adult (HCC)  E66.01    Z68.43    This issue adds complexity to her management Continue to recommend weight loss     Orders Placed This Encounter  Procedures   AMB referral to pulmonary rehabilitation    Referral Priority:   Routine    Referral Type:   Consultation    Number of Visits Requested:   1   AMB REFERRAL FOR DME    Referral Priority:  Routine    Referral Type:   Durable Medical Equipment Purchase    Number of Visits Requested:   1   Overall.[name] [name] is doing well.  We will see her in follow-up in 6 to 8 weeks time she is to contact us prior to that time should any new difficulties arise.  Gailen Shelter, MD Advanced Bronchoscopy PCCM [facility]    *This note was dictated using voice recognition software/Dragon.  Despite best efforts to proofread, errors can occur which can change the meaning. Any transcriptional errors that result from this process are unintentional and may not be fully corrected at the time of dictation.

## 2022-03-29 NOTE — Patient Instructions (Signed)
We are going to send a referral to pulmonary rehab.  We are sending a order to [employer] to see if they will evaluate you for a portable oxygen concentrator  We will see you in follow-up in 6 to 8 weeks time call sooner should any new problems arise.

## 2022-03-31 ENCOUNTER — Encounter: Payer: Medicaid Other | Attending: Pulmonary Disease

## 2022-03-31 ENCOUNTER — Other Ambulatory Visit: Payer: Self-pay

## 2022-03-31 DIAGNOSIS — J9610 Chronic respiratory failure, unspecified whether with hypoxia or hypercapnia: Secondary | ICD-10-CM | POA: Insufficient documentation

## 2022-03-31 DIAGNOSIS — U099 Post covid-19 condition, unspecified: Secondary | ICD-10-CM | POA: Insufficient documentation

## 2022-03-31 DIAGNOSIS — J8410 Pulmonary fibrosis, unspecified: Secondary | ICD-10-CM | POA: Insufficient documentation

## 2022-03-31 NOTE — Progress Notes (Signed)
Virtual Visit completed. Patient informed on EP and RD appointment and 6 Minute walk test. Patient also informed of patient health questionnaires on My Chart. Patient Verbalizes understanding. Visit diagnosis can be found in [facility] 03/29/2022.  

## 2022-04-01 ENCOUNTER — Ambulatory Visit: Payer: Medicaid Other

## 2022-04-05 ENCOUNTER — Encounter: Payer: Medicaid Other | Admitting: *Deleted

## 2022-04-05 ENCOUNTER — Ambulatory Visit: Admit: 2022-04-05 | Discharge: 2022-04-06 | Payer: PRIVATE HEALTH INSURANCE

## 2022-04-05 VITALS — Ht 66.75 in | Wt 318.1 lb

## 2022-04-05 DIAGNOSIS — J8410 Pulmonary fibrosis, unspecified: Secondary | ICD-10-CM

## 2022-04-05 DIAGNOSIS — J9610 Chronic respiratory failure, unspecified whether with hypoxia or hypercapnia: Secondary | ICD-10-CM | POA: Diagnosis not present

## 2022-04-05 DIAGNOSIS — U099 Post covid-19 condition, unspecified: Secondary | ICD-10-CM | POA: Diagnosis not present
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
## 2022-04-05 NOTE — Patient Instructions (Signed)
Patient Instructions  Patient Details  Name: [name] [name] MRN: [id number] Date of Birth: 1960/12/27 Referring Provider:  Salena Saner, MD  Below are your personal goals for exercise, nutrition, and risk factors. Our goal is to help you stay on track towards obtaining and maintaining these goals. We will be discussing your progress on these goals with you throughout the program.  Initial Exercise Prescription:  Initial Exercise Prescription - 04/05/22 1700       Date of Initial Exercise RX and Referring Provider   Date 04/05/22    Referring Provider Francene Boyers      Oxygen   Oxygen Continuous    Liters 3    Maintain Oxygen Saturation 88% or higher      NuStep   Level 1    SPM 80    Minutes 15    METs 1      Biostep-RELP   Level 1    SPM 80    Minutes 15    METs 1      Track   Laps 5    Minutes 15    METs 1.27      Prescription Details   Frequency (times per week) 2    Duration Progress to 30 minutes of continuous aerobic without signs/symptoms of physical distress      Intensity   THRR 40-80% of Max Heartrate 101-140    Ratings of Perceived Exertion 11-13    Perceived Dyspnea 0-4      Progression   Progression Continue to progress workloads to maintain intensity without signs/symptoms of physical distress.      Resistance Training   Training Prescription Yes    Weight 2    Reps 10-15             Exercise Goals: Frequency: Be able to perform aerobic exercise two to three times per week in program working toward 2-5 days per week of home exercise.  Intensity: Work with a perceived exertion of 11 (fairly light) - 15 (hard) while following your exercise prescription.  We will make changes to your prescription with you as you progress through the program.   Duration: Be able to do 30 to 45 minutes of continuous aerobic exercise in addition to a 5 minute warm-up and a 5 minute cool-down routine.   Nutrition Goals: Your personal nutrition goals will  be established when you do your nutrition analysis with the dietician.  The following are general nutrition guidelines to follow: Cholesterol < /day Sodium < /day Fiber: Women over 50 yrs - 21 grams per day  Personal Goals:  Personal Goals and Risk Factors at Admission - 04/05/22 1711       Core Components/Risk Factors/Patient Goals on Admission    Weight Management Weight Loss;Yes    Intervention Weight Management: Develop a combined nutrition and exercise program designed to reach desired caloric intake, while maintaining appropriate intake of nutrient and fiber, sodium and fats, and appropriate energy expenditure required for the weight goal.;Weight Management: Provide education and appropriate resources to help participant work on and attain dietary goals.;Weight Management/Obesity: Establish reasonable short term and long term weight goals.;Obesity: Provide education and appropriate resources to help participant work on and attain dietary goals.    Admit Weight 318 lb 1.6 oz (144.3 kg)    Goal Weight: Short Term 308 lb (139.7 kg)    Goal Weight: Long Term 250 lb (113.4 kg)    Expected Outcomes Short Term: Continue to assess and modify interventions until short  term weight is achieved;Long Term: Adherence to nutrition and physical activity/exercise program aimed toward attainment of established weight goal;Weight Maintenance: Understanding of the daily nutrition guidelines, which includes 25-35% calories from fat, 7% or less cal from saturated fats, less than 200mg  cholesterol, less than 1.5gm of sodium, & 5 or more servings of fruits and vegetables daily;Weight Loss: Understanding of general recommendations for a balanced deficit meal plan, which promotes 1-2 lb weight loss per week and includes a negative energy balance of [phone] kcal/d;Understanding recommendations for meals to include 15-35% energy as protein, 25-35% energy from fat, 35-60% energy from carbohydrates, less than  200mg  of dietary cholesterol, 20-35 gm of total fiber daily;Understanding of distribution of calorie intake throughout the day with the consumption of 4-5 meals/snacks    Improve shortness of breath with ADL's Yes    Intervention Provide education, individualized exercise plan and daily activity instruction to help decrease symptoms of SOB with activities of daily living.    Expected Outcomes Short Term: Improve cardiorespiratory fitness to achieve a reduction of symptoms when performing ADLs;Long Term: Be able to perform more ADLs without symptoms or delay the onset of symptoms    Diabetes Yes    Intervention Provide education about signs/symptoms and action to take for hypo/hyperglycemia.;Provide education about proper nutrition, including hydration, and aerobic/resistive exercise prescription along with prescribed medications to achieve blood glucose in normal ranges: Fasting glucose 65-99 mg/dL    Expected Outcomes Long Term: Attainment of HbA1C < 7%.;Short Term: Participant verbalizes understanding of the signs/symptoms and immediate care of hyper/hypoglycemia, proper foot care and importance of medication, aerobic/resistive exercise and nutrition plan for blood glucose control.    Heart Failure Yes    Intervention Provide a combined exercise and nutrition program that is supplemented with education, support and counseling about heart failure. Directed toward relieving symptoms such as shortness of breath, decreased exercise tolerance, and extremity edema.    Expected Outcomes Improve functional capacity of life;Short term: Daily weights obtained and reported for increase. Utilizing diuretic protocols set by physician.;Long term: Adoption of self-care skills and reduction of barriers for early signs and symptoms recognition and intervention leading to self-care maintenance.;Short term: Attendance in program 2-3 days a week with increased exercise capacity. Reported lower sodium intake. Reported  increased fruit and vegetable intake. Reports medication compliance.    Hypertension Yes    Intervention Provide education on lifestyle modifcations including regular physical activity/exercise, weight management, moderate sodium restriction and increased consumption of fresh fruit, vegetables, and low fat dairy, alcohol moderation, and smoking cessation.;Monitor prescription use compliance.    Expected Outcomes Short Term: Continued assessment and intervention until BP is < 140/44mm HG in hypertensive participants. < 130/29mm HG in hypertensive participants with diabetes, heart failure or chronic kidney disease.;Long Term: Maintenance of blood pressure at goal levels.    Lipids Yes    Intervention Provide education and support for participant on nutrition & aerobic/resistive exercise along with prescribed medications to achieve LDL 70mg , HDL >40mg .    Expected Outcomes Short Term: Participant states understanding of desired cholesterol values and is compliant with medications prescribed. Participant is following exercise prescription and nutrition guidelines.;Long Term: Cholesterol controlled with medications as prescribed, with individualized exercise RX and with personalized nutrition plan. Value goals: LDL < 70mg , HDL > 40 mg.             Tobacco Use Initial Evaluation: Social History   Tobacco Use  Smoking Status Former   Packs/day: 2.00   Years: 27.00   Total  pack years: 54.00   Types: Cigarettes   Quit date: 07/30/1999   Years since quitting: 22.6  Smokeless Tobacco Never  Tobacco Comments   quit in 2001 2-2.5 a day    Exercise Goals and Review:  Exercise Goals     Row Name 04/05/22 1709             Exercise Goals   Increase Physical Activity Yes       Intervention Provide advice, education, support and counseling about physical activity/exercise needs.;Develop an individualized exercise prescription for aerobic and resistive training based on initial evaluation  findings, risk stratification, comorbidities and participant's personal goals.       Expected Outcomes Short Term: Attend rehab on a regular basis to increase amount of physical activity.;Long Term: Add in home exercise to make exercise part of routine and to increase amount of physical activity.;Long Term: Exercising regularly at least 3-5 days a week.       Increase Strength and Stamina Yes       Intervention Provide advice, education, support and counseling about physical activity/exercise needs.;Develop an individualized exercise prescription for aerobic and resistive training based on initial evaluation findings, risk stratification, comorbidities and participant's personal goals.       Expected Outcomes Short Term: Increase workloads from initial exercise prescription for resistance, speed, and METs.;Short Term: Perform resistance training exercises routinely during rehab and add in resistance training at home;Long Term: Improve cardiorespiratory fitness, muscular endurance and strength as measured by increased METs and functional capacity ( )       Able to understand and use rate of perceived exertion (RPE) scale Yes       Intervention Provide education and explanation on how to use RPE scale       Expected Outcomes Short Term: Able to use RPE daily in rehab to express subjective intensity level;Long Term:  Able to use RPE to guide intensity level when exercising independently       Able to understand and use Dyspnea scale Yes       Intervention Provide education and explanation on how to use Dyspnea scale       Expected Outcomes Short Term: Able to use Dyspnea scale daily in rehab to express subjective sense of shortness of breath during exertion;Long Term: Able to use Dyspnea scale to guide intensity level when exercising independently       Knowledge and understanding of Target Heart Rate Range (THRR) Yes       Intervention Provide education and explanation of THRR including how the numbers  were predicted and where they are located for reference       Expected Outcomes Short Term: Able to state/look up THRR;Long Term: Able to use THRR to govern intensity when exercising independently;Short Term: Able to use daily as guideline for intensity in rehab       Able to check pulse independently Yes       Intervention Provide education and demonstration on how to check pulse in carotid and radial arteries.;Review the importance of being able to check your own pulse for safety during independent exercise       Expected Outcomes Short Term: Able to explain why pulse checking is important during independent exercise;Long Term: Able to check pulse independently and accurately       Understanding of Exercise Prescription Yes       Intervention Provide education, explanation, and written materials on patient's individual exercise prescription       Expected Outcomes Short Term: Able  to explain program exercise prescription;Long Term: Able to explain home exercise prescription to exercise independently                Copy of goals given to participant.

## 2022-04-07 ENCOUNTER — Other Ambulatory Visit: Payer: Medicaid Other

## 2022-04-07 ENCOUNTER — Encounter: Payer: Medicaid Other | Admitting: *Deleted

## 2022-04-07 DIAGNOSIS — J8410 Pulmonary fibrosis, unspecified: Secondary | ICD-10-CM

## 2022-04-07 LAB — GLUCOSE, CAPILLARY
Glucose-Capillary: 132 mg/dL — ABNORMAL HIGH (ref 70–99)
Glucose-Capillary: 126 mg/dL — ABNORMAL HIGH (ref 70–99)

## 2022-04-07 NOTE — Patient Outreach (Signed)
Medicaid Managed Care Social Work Note  04/07/2022 Name:  [name] [name] MRN:  [id number] DOB:  1960-05-16  [name] [name] is an 61 y.o. year old female who is a primary patient of [name] Nicks, NP.  The Medicaid Managed Care Coordination team was consulted for assistance with:  Community Resources   [name] [name] was given information about Medicaid Managed Care Coordination team services today. [name] [name] Patient agreed to services and verbal consent obtained.  Engaged with patient  for by telephone forfollow up visit in response to referral for case management and/or care coordination services.   Assessments/Interventions:  Review of past medical history, allergies, medications, health status, including review of consultants reports, laboratory and other test data, was performed as part of comprehensive evaluation and provision of chronic care management services.  SDOH: (Social Determinant of Health) assessments and interventions performed: SDOH Interventions    Flowsheet Row Pulmonary Rehab from 04/05/2022 in [facility] Cardiac [facility] Pulmonary Rehab Patient Outreach Telephone from 03/26/2022 in [place] POPULATION HEALTH DEPARTMENT Patient Outreach Telephone from 03/19/2022 in [place] POPULATION HEALTH DEPARTMENT Patient Outreach Telephone from 03/17/2022 in [place] POPULATION HEALTH DEPARTMENT ED to Hosp-Admission (Discharged) from 12/27/2021 in [facility] MEDICAL TELEMETRY Office Visit from 03/31/2016 in [facility]  SDOH Interventions        Food Insecurity Interventions -- -- -- -- Intervention Not Indicated --  Housing Interventions -- -- Intervention Not Indicated Intervention Not Indicated Intervention Not Indicated --  Transportation Interventions -- -- -- Intervention Not Indicated Intervention Not Indicated --  Utilities Interventions -- -- -- -- Intervention Not Indicated --  Depression  Interventions/Treatment  Medication -- Referral to Psychiatry Currently on Treatment  [Referral to LCSW] -- Medication, Currently on Treatment  Stress Interventions -- [employer], Provide Counseling -- -- -- --     [name] [name] completed a telephone outreach with patient, She stated she did receieve the letter [name] [name] sent and not other resources are needed at this time  Advanced Directives Status:  Not addressed in this encounter.  Care Plan                 Allergies  Allergen Reactions   Cephalexin Hives   Codeine Palpitations, Nausea Only, Nausea And Vomiting, Rash and Shortness Of Breath    "makes heart fly, she gets flushed and passes out"   Doxycycline Rash   Propoxyphene Rash and Shortness Of Breath    Increase heart rate   Sulfa Antibiotics Palpitations, Nausea Only, Shortness Of Breath and Hives    "makes heart fly, she gets flushed and passes out"   Clindamycin Rash    Tongue swelling, oral sores, and Mouth rash   Lovenox [Enoxaparin Sodium] Hives   Hydrocodone Nausea And Vomiting    Hear racing & breaks out into a cold sweat.   Meropenem Rash    Erythematous, hot, pruritic rash over arms, chest, back, abdomen, and [name] occurred at the end of meropenem infusion on 02/22/18    Medications Reviewed Today     Reviewed by [name] [name], RRT (Respiratory Therapist) on 03/31/22 at 1418  Med List Status: <None>   Medication Order Taking? Sig Documenting Provider Last Dose Status Informant  acetaminophen (TYLENOL) 325 MG tablet [id number] Yes Take 2 tablets (650 mg total) by mouth every 6 (six) hours as needed for mild pain or moderate pain (or Fever >/= 101). [name] [name] [employer], DO Taking Active Self  amLODipine (  NORVASC) 10 MG tablet [id number] Yes Take 10 mg by mouth daily. [provider] Taking Active Self  ANORO ELLIPTA 62.5-25 MCG/ACT AEPB [id number] No 1 puff daily.  Patient not taking: Reported on 03/01/2022   [provider] Not Taking  Active Self  ARIPiprazole (ABILIFY) 5 MG tablet [id number] Yes Take 5 mg by mouth daily. [provider] Taking Active Self  ascorbic acid (VITAMIN C) 500 MG tablet [id number] Yes Take 1 tablet (500 mg total) by mouth daily. Rolly Salter, MD Taking Active Self  atorvastatin (LIPITOR) 80 MG tablet [id number] Yes Take 80 mg by mouth daily. [provider] Taking Active Self  Budeson-Glycopyrrol-Formoterol (BREZTRI AEROSPHERE) 160-9-4.8 MCG/ACT AERO [id number] Yes Inhale 2 puffs into the lungs in the morning and at bedtime. Salena Saner, MD Taking Active Self  busPIRone (BUSPAR) 10 MG tablet [id number] Yes Take 1 tablet (10 mg total) by mouth 2 (two) times daily. Clapacs, Jackquline Denmark, MD Taking Active Self  BUTRANS 7.5 MCG/HR [id number] Yes Place 1 patch onto the skin once a week. [provider] Taking Active   Calcium Citrate-Vitamin D3 (GNP CALCIUM CITRATE+D MAXIMUM) 315-6.25 MG-MCG TABS [id number] Yes Take 1 tablet by mouth daily. [provider] Taking Active Self  Cholecalciferol (VITAMIN D) 125 MCG (5000 UT) CAPS [id number] Yes Take 1 capsule by mouth daily. [provider] Taking Active Self  ELIQUIS 5 MG TABS tablet [id number] Yes TAKE 1 TABLET BY MOUTH TWICE DAILY  Patient taking differently: Take 5 mg by mouth 2 (two) times daily.   Salena Saner, MD Taking Active Self  famotidine (PEPCID) 20 MG tablet [id number] Yes Take 1 tablet (20 mg total) by mouth daily. Rolly Salter, MD Taking Active Self  FLUoxetine (PROZAC) 40 MG capsule [id number] Yes Take 1 capsule (40 mg total) by mouth daily. Clapacs, Jackquline Denmark, MD Taking Active Self  folic acid (FOLVITE) 1 MG tablet [id number] Yes Take 1 mg by mouth daily.  [provider] Taking Active Self  hydroxychloroquine (PLAQUENIL) 200 MG tablet [id number] Yes Take 200 mg by mouth 2 (two) times daily. [provider] Taking Active Self  levothyroxine (SYNTHROID, LEVOTHROID) 88 MCG tablet [id number]  Yes Take 88 mcg by mouth daily before breakfast. [provider] Taking Active Self  metFORMIN (GLUCOPHAGE-XR) 500 MG 24 hr tablet [id number] Yes Take 500 mg by mouth daily. [provider] Taking Active Self  methotrexate (RHEUMATREX) 2.5 MG tablet [id number] Yes Take 2.5 mg by mouth once a week. [provider] Taking Active   metoCLOPramide (REGLAN) 5 MG/5ML solution [id number] Yes Take 5 mg by mouth 4 (four) times daily -  before meals and at bedtime. [provider] Taking Active Self           Med Note (ROBB, MELANIE A   Wed Mar 17, 2022  9:19 AM) Taking 10 mg 4 times daily  metoprolol tartrate (LOPRESSOR) 25 MG tablet [id number] Yes Take 25 mg by mouth 2 (two) times daily. [provider] Taking Active Self  oxyCODONE-acetaminophen (PERCOCET/ROXICET) 5-325 MG tablet [id number] No Take 1 tablet by mouth every 8 (eight) hours as needed for severe pain.  Patient not taking: Reported on 03/31/2022   [provider] Not Taking Active Self  pramipexole (MIRAPEX) 0.5 MG tablet [id number] Yes Take 0.5 mg by mouth at bedtime. [provider] Taking Active   pregabalin (LYRICA) 100 MG capsule [id number] Yes Take 1 capsule (100 mg total) by mouth 3 (three) times daily. Edward Jolly, MD  Taking Active Self  promethazine (PHENERGAN) 12.5 MG tablet [id number] Yes Take 12.5 mg by mouth. [provider] Taking Active Self  QUEtiapine (SEROQUEL) 300 MG tablet [id number] Yes TAKE ONE TABLET BY MOUTH AT BEDTIME. Clapacs, Jackquline Denmark, MD Taking Active Self  TRADJENTA 5 MG TABS tablet [id number] Yes Take 5 mg by mouth daily. [provider] Taking Active Self  zinc sulfate 220 (50 Zn) MG capsule [id number] Yes Take 220 mg by mouth at bedtime.  [provider] Taking Active Self  zolpidem (AMBIEN) 5 MG tablet [id number] Yes Take 1 tablet (5 mg total) by mouth at bedtime. Clapacs, Jackquline Denmark, MD Taking Active Self            Patient Active  Problem List   Diagnosis Date Noted   Gastroparesis 02/28/2022   Allergy to multiple antibiotics 02/28/2022   Lactic acidosis 01/05/2022   Sepsis (HCC) 01/04/2022   Acute cystitis 12/27/2021   Morbid obesity (HCC)    Depression with anxiety    PAF (paroxysmal atrial fibrillation) (HCC)    Chronic respiratory failure with hypoxia (HCC) 02/07/2020   Paroxysmal A-fib (HCC)    Morbid obesity with BMI of 50.0-59.9, adult (HCC) 07/08/2019   Controlled type 2 diabetes mellitus without complication, without long-term current use of insulin (HCC) 04/06/2019   CKD (chronic kidney disease), stage III (HCC) 04/06/2019   HLD (hyperlipidemia) 04/06/2019   AKI (acute kidney injury) (HCC) 02/12/2019   Diabetic ulcer of left foot (HCC) 02/14/2018   Chronic pain syndrome 03/31/2016   Rheumatoid arthritis (HCC) 12/25/2015   Osteoarthritis, multiple sites 12/24/2015   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[employer]) 12/23/2015   Long term current use of opiate analgesic 12/23/2015   Long term prescription opiate use 12/23/2015   Diabetic peripheral neuropathy (HCC) 12/23/2015   Chronic diastolic CHF (congestive heart failure) (HCC) 07/03/2014   COPD (chronic obstructive pulmonary disease) (HCC) 01/03/2014   Bipolar disorder, unspecified (HCC) 01/03/2014   Borderline personality disorder (HCC) 01/06/2012   Hypothyroidism 06/28/2010   Rheumatoid arthritis involving multiple sites with positive rheumatoid factor (HCC) 06/28/2010   Essential (primary) hypertension 01/27/2005   Major depressive disorder, recurrent episode, moderate (HCC) 06/03/2004    Conditions to be addressed/monitored per PCP order:   community resources  Care Plan : RN Care Manager Plan of Care  Updates made by Shaune Leeks since 04/07/2022 12:00 AM     Problem: Health Management needs related to COPD and Mental Health      Long-Range Goal: Development of Plan of Care To address Health Management needs  related to COPD and Mental Health   Start Date: 03/17/2022  Expected End Date: 06/15/2022  Priority: High  Note:   Current Barriers:  Chronic Disease Management support and education needs related to COPD and Mental Health  RNCM Clinical Goal(s):  Patient will verbalize understanding of plan for management of COPD and Mental Health as evidenced by patient reports take all medications exactly as prescribed and will call provider for medication related questions as evidenced by patient reports and EMR documentation    attend all scheduled medical appointments: 03/17/22 for breathing test, 11/30 with [name] [name], 12/1 with LCSW, 12/4 with Rheumatology, 12/7 with Heart and Vascular/Podiatry, 12/11 with Pulmonology and 12/18 for Breast Imaging as evidenced by Provider documentation        continue to work with RN Care Manager and/or Social Worker to address care management and care coordination needs related to COPD and Mental Health as evidenced by adherence to CM  Team Scheduled appointments     through collaboration with RN Care manager, provider, and care team.   Interventions: Inter-disciplinary care team collaboration (see longitudinal plan of care) Evaluation of current treatment plan related to  self management and patient's adherence to plan as established by provider [name] [name] completed a telephone outreach with patient for MOW and food pantries. Patient states she does receive SSI, but does not receive foodstamps because they will take it out of her check. [name] [name] will mail patient a list of food pantries in [place] and information on MOW. No other resources are needed at this time. [name] [name] completed a telephone outreach with patient, She stated she did receieve the letter [name] [name] sent and not other resources are needed at this time.  COPD: (Status: New goal.) Long Term Goal  Reviewed medications with patient, including use of prescribed maintenance and rescue inhalers, and provided instruction on medication  management and the importance of adherence Provided patient with basic written and verbal COPD education on self care/management/and exacerbation prevention Provided instruction about proper use of medications used for management of COPD including inhalers Provided education about and advised patient to utilize infection prevention strategies to reduce risk of respiratory infection Discussed the importance of adequate rest and management of fatigue with COPD Screening for signs and symptoms of depression related to chronic disease state  Assessed social determinant of health barriers Discussed Pulmonary Rehab and advised patient to discuss with Pulmonology on 03/29/22 Advised patient to discuss portable oxygen concentrator with Pulmonology on 03/29/22 Referral to [name] [name] for assistance with local food pantries and MOW-scheduled on 03/18/22 @ 9am Advised patient to contact [place] [employer] [phone] for member benefits   Mental Health  (Status:  New goal.)  Long Term Goal-Patient currently sees Psychiatry every 6 months, she would like to talk with someone more frequently Evaluation of current treatment plan related to  Mental Health ,  self-management and patient's adherence to plan as established by provider. Discussed plans with patient for ongoing care management follow up and provided patient with direct contact information for care management team Reviewed medications with patient and discussed Ambien is on medication list, but has not been dispensed since 10/2021, patient not sleeping at night Social Work referral for Assistance with Therapy/Counseling-scheduled with [name] on 03/19/22 @ 10:30am Screening for signs and symptoms of depression related to chronic disease state  Assessed social determinant of health barriers Collaborated with Psychiatry for guidance regarding Ambien   Patient Goals/Self-Care Activities: Take medications as prescribed   Attend all scheduled provider  appointments Call provider office for new concerns or questions  Work with the social worker to address care coordination needs and will continue to work with the clinical team to address health care and disease management related needs call 1-800-273-TALK (toll free, 24 hour hotline) go to [facility] [address] [place] [phone]) call 911 if experiencing a Mental Health or Behavioral Health Crisis  avoid second hand smoke limit outdoor activity during cold weather develop a new routine to improve sleep practice relaxation or meditation daily Discuss pulmonary rehab with provider Cut back on sugary drinks       Follow up:  Patient agrees to Care Plan and Follow-up.  Plan: The Managed Medicaid care management team will reach out to the patient again over the next 30 days.  Date/time of next scheduled Social Work care management/care coordination outreach:  05/10/22  Gus Puma, [name] [name], [facility]  [place]  High Risk  Managed Medicaid Team  [phone]

## 2022-04-07 NOTE — Patient Instructions (Signed)
Visit Information  [name] was given information about Medicaid Managed Care team care coordination services as a part of their [place] [employer] Medicaid benefit. [name] [name] verbally consented to engagement with the [facility] Managed Care team.   If you are experiencing a medical emergency, please call 911 or report to your local emergency department or urgent care.   If you have a non-emergency medical problem during routine business hours, please contact your provider's office and ask to speak with a nurse.   For questions related to your [place] [employer] Medicaid health plan, please call: [phone]  If you would like to schedule transportation through your [place] [employer] Medicaid plan, please call the following number at least 2 days in advance of your appointment: [phone].   There is no limit to the number of trips during the year between medical appointments, healthcare facilities, or pharmacies. Transportation must be scheduled at least 2 business days before but not more than thirty 30 days before of your appointment.  Call the Behavioral Health Crisis Line at [phone], at any time, 24 hours a day, 7 days a week. If you are in danger or need immediate medical attention call 911.  If you would like help to quit smoking, call 1-800-QUIT-NOW (661-312-3772) OR Espaol: 1-855-Djelo-Ya (3-474-259-5638) o para ms informacin haga clic aqu or Text READY to 756-433 to register via text  [name] [name] - following are the goals we discussed in your visit today:     Social Worker will follow up on 05/10/22.  [name] [name], BSW, [place] [facility]  [place]  High Risk Managed Medicaid Team  [phone]   Following is a copy of your plan of care:  Care Plan : RN Care Manager Plan of Care  Updates made by [name] [name] since 04/07/2022 12:00 AM     Problem: Health Management needs related to COPD and Mental Health      Long-Range Goal:  Development of Plan of Care To address Health Management needs related to COPD and Mental Health   Start Date: 03/17/2022  Expected End Date: 06/15/2022  Priority: High  Note:   Current Barriers:  Chronic Disease Management support and education needs related to COPD and Mental Health  RNCM Clinical Goal(s):  Patient will verbalize understanding of plan for management of COPD and Mental Health as evidenced by patient reports take all medications exactly as prescribed and will call provider for medication related questions as evidenced by patient reports and EMR documentation    attend all scheduled medical appointments: 03/17/22 for breathing test, 11/30 with BSW, 12/1 with LCSW, 12/4 with Rheumatology, 12/7 with Heart and Vascular/Podiatry, 12/11 with Pulmonology and 12/18 for Breast Imaging as evidenced by Provider documentation        continue to work with RN Care Manager and/or Social Worker to address care management and care coordination needs related to COPD and Mental Health as evidenced by adherence to CM Team Scheduled appointments     through collaboration with RN Care manager, provider, and care team.   Interventions: Inter-disciplinary care team collaboration (see longitudinal plan of care) Evaluation of current treatment plan related to  self management and patient's adherence to plan as established by provider BSW completed a telephone outreach with patient for MOW and food pantries. Patient states she does receive SSI, but does not receive foodstamps because they will take it out of her check. BSW will mail patient a list of food pantries in [place] and information on MOW.  No other resources are needed at this time. BSW completed a telephone outreach with patient, She stated she did receieve the letter BSW sent and not other resources are needed at this time.  COPD: (Status: New goal.) Long Term Goal  Reviewed medications with patient, including use of prescribed  maintenance and rescue inhalers, and provided instruction on medication management and the importance of adherence Provided patient with basic written and verbal COPD education on self care/management/and exacerbation prevention Provided instruction about proper use of medications used for management of COPD including inhalers Provided education about and advised patient to utilize infection prevention strategies to reduce risk of respiratory infection Discussed the importance of adequate rest and management of fatigue with COPD Screening for signs and symptoms of depression related to chronic disease state  Assessed social determinant of health barriers Discussed Pulmonary Rehab and advised patient to discuss with Pulmonology on 03/29/22 Advised patient to discuss portable oxygen concentrator with Pulmonology on 03/29/22 Referral to BSW for assistance with local food pantries and MOW-scheduled on 03/18/22 @ 9am Advised patient to contact [employer] [phone] for member benefits   Mental Health  (Status:  New goal.)  Long Term Goal-Patient currently sees Psychiatry every 6 months, she would like to talk with someone more frequently Evaluation of current treatment plan related to  Mental Health ,  self-management and patient's adherence to plan as established by provider. Discussed plans with patient for ongoing care management follow up and provided patient with direct contact information for care management team Reviewed medications with patient and discussed Ambien is on medication list, but has not been dispensed since 10/2021, patient not sleeping at night Social Work referral for Assistance with Therapy/Counseling-scheduled with [name] [name] on 03/19/22 @ 10:30am Screening for signs and symptoms of depression related to chronic disease state  Assessed social determinant of health barriers Collaborated with Psychiatry for guidance regarding Ambien   Patient Goals/Self-Care  Activities: Take medications as prescribed   Attend all scheduled provider appointments Call provider office for new concerns or questions  Work with the social worker to address care coordination needs and will continue to work with the clinical team to address health care and disease management related needs call 1-800-273-TALK (toll free, 24 hour hotline) go to [facility] [address], [place] [phone]) call 911 if experiencing a Mental Health or Behavioral Health Crisis  avoid second hand smoke limit outdoor activity during cold weather develop a new routine to improve sleep practice relaxation or meditation daily Discuss pulmonary rehab with provider Cut back on sugary drinks

## 2022-04-07 NOTE — Progress Notes (Signed)
Daily Session Note  Patient Details  Name: [name] [name] MRN: [id number] Date of Birth: May 21, 1960 Referring Provider:   Flowsheet Row Pulmonary Rehab from 04/05/2022 in [facility] Cardiac and Pulmonary Rehab  Referring Provider Galen Daft       Encounter Date: 04/07/2022  Check In:  Session Check In - 04/07/22 1622       Check-In   Supervising physician immediately available to respond to emergencies See telemetry face sheet for immediately available ER MD    Location [facility]-Cardiac & Pulmonary Rehab    Staff Present Renita Papa, RN Moises Blood, BS, ACSM CEP, Exercise Physiologist;Joseph [place], RCP,RRT,BSRT;Noah Tickle, [place], Exercise Physiologist    Virtual Visit No    Medication changes reported     No    Fall or balance concerns reported    No    Warm-up and Cool-down Performed on first and last piece of equipment    Resistance Training Performed Yes    VAD Patient? No    PAD/SET Patient? No      Pain Assessment   Currently in Pain? No/denies                Social History   Tobacco Use  Smoking Status Former   Packs/day: 2.00   Years: 27.00   Total pack years: 54.00   Types: Cigarettes   Quit date: 07/30/1999   Years since quitting: 22.7  Smokeless Tobacco Never  Tobacco Comments   quit in 2001 2-2.5 a day    Goals Met:  Independence with exercise equipment Exercise tolerated well No report of concerns or symptoms today Strength training completed today  Goals Unmet:  Not Applicable  Comments: First full day of exercise!  Patient was oriented to gym and equipment including functions, settings, policies, and procedures.  Patient's individual exercise prescription and treatment plan were reviewed.  All starting workloads were established based on the results of the 6 minute walk test done at initial orientation visit.  The plan for exercise progression was also introduced and progression will be customized based on patient's performance and  goals.     Dr. Emily Filbert is Medical Director for [place].  Dr. Ottie Glazier is Medical Director for [facility] Pulmonary Rehabilitation.

## 2022-04-10 MED ORDER — ZINC SULFATE 50 MG ZINC (220 MG) CAPSULE
ORAL_CAPSULE | Freq: Every day | ORAL | 5 refills | 0 days
Start: 2022-04-10 — End: ?

## 2022-04-10 MED ORDER — CALCIUM CITRATE 315 MG-VITAMIN D3 5 MCG (200 UNIT) TABLET
ORAL_TABLET | Freq: Every day | ORAL | 0 refills | 0 days
Start: 2022-04-10 — End: ?

## 2022-04-13 MED ORDER — ZINC SULFATE 50 MG ZINC (220 MG) CAPSULE
ORAL_CAPSULE | Freq: Every day | ORAL | 10 refills | 30 days | Status: CP
Start: 2022-04-13 — End: 2023-04-13

## 2022-04-13 MED ORDER — CALCIUM CITRATE 315 MG-VITAMIN D3 5 MCG (200 UNIT) TABLET
ORAL_TABLET | Freq: Every day | ORAL | 10 refills | 30 days | Status: CP
Start: 2022-04-13 — End: 2023-04-13

## 2022-04-14 ENCOUNTER — Encounter: Payer: Self-pay | Admitting: *Deleted

## 2022-04-14 DIAGNOSIS — J8410 Pulmonary fibrosis, unspecified: Secondary | ICD-10-CM

## 2022-04-14 MED ORDER — METOCLOPRAMIDE 5 MG/5 ML ORAL SOLUTION
ORAL | 3 refills | 17 days | Status: CP
Start: 2022-04-14 — End: 2022-06-22

## 2022-04-14 NOTE — Progress Notes (Signed)
Pulmonary Individual Treatment Plan  Patient Details  Name: [name] [name] MRN: [id number] Date of Birth: 1958/02/02 Referring Provider:   Flowsheet Row Pulmonary Rehab from 04/05/2022 in [facility] Cardiac and Pulmonary Rehab  Referring Provider Francene Boyers       Initial Encounter Date:  Flowsheet Row Pulmonary Rehab from 04/05/2022 in [facility] Cardiac and Pulmonary Rehab  Date 04/05/22       Visit Diagnosis: Pulmonary fibrosis (HCC)  Patient's Home Medications on Admission:  Current Outpatient Medications:    acetaminophen (TYLENOL) 325 MG tablet, Take 2 tablets (650 mg total) by mouth every 6 (six) hours as needed for mild pain or moderate pain (or Fever >/= 101)., Disp: 20 tablet, Rfl: 0   amLODipine (NORVASC) 10 MG tablet, Take 10 mg by mouth daily., Disp: , Rfl:    ANORO ELLIPTA 62.5-25 MCG/ACT AEPB, 1 puff daily. (Patient not taking: Reported on 03/01/2022), Disp: , Rfl:    ARIPiprazole (ABILIFY) 5 MG tablet, Take 5 mg by mouth daily., Disp: , Rfl:    ascorbic acid (VITAMIN C) 500 MG tablet, Take 1 tablet (500 mg total) by mouth daily., Disp: 30 tablet, Rfl: 0   atorvastatin (LIPITOR) 80 MG tablet, Take 80 mg by mouth daily., Disp: , Rfl:    Budeson-Glycopyrrol-Formoterol (BREZTRI AEROSPHERE) 160-9-4.8 MCG/ACT AERO, Inhale 2 puffs into the lungs in the morning and at bedtime., Disp: 5.9 g, Rfl: 0   busPIRone (BUSPAR) 10 MG tablet, Take 1 tablet (10 mg total) by mouth 2 (two) times daily., Disp: 60 tablet, Rfl: 5   BUTRANS 7.5 MCG/HR, Place 1 patch onto the skin once a week., Disp: , Rfl:    Calcium Citrate-Vitamin D3 (GNP CALCIUM CITRATE+D MAXIMUM) 315-6.25 MG-MCG TABS, Take 1 tablet by mouth daily., Disp: , Rfl:    Cholecalciferol (VITAMIN D) 125 MCG (5000 UT) CAPS, Take 1 capsule by mouth daily., Disp: , Rfl:    ELIQUIS 5 MG TABS tablet, TAKE 1 TABLET BY MOUTH TWICE DAILY (Patient taking differently: Take 5 mg by mouth 2 (two) times daily.), Disp: 56 tablet, Rfl: 0   famotidine  (PEPCID) 20 MG tablet, Take 1 tablet (20 mg total) by mouth daily., Disp: 30 tablet, Rfl: 0   FLUoxetine (PROZAC) 40 MG capsule, Take 1 capsule (40 mg total) by mouth daily., Disp: 30 capsule, Rfl: 5   folic acid (FOLVITE) 1 MG tablet, Take 1 mg by mouth daily. , Disp: , Rfl:    hydroxychloroquine (PLAQUENIL) 200 MG tablet, Take 200 mg by mouth 2 (two) times daily., Disp: , Rfl:    levothyroxine (SYNTHROID, LEVOTHROID) 88 MCG tablet, Take 88 mcg by mouth daily before breakfast., Disp: , Rfl:    metFORMIN (GLUCOPHAGE-XR) 500 MG 24 hr tablet, Take 500 mg by mouth daily., Disp: , Rfl:    methotrexate (RHEUMATREX) 2.5 MG tablet, Take 2.5 mg by mouth once a week., Disp: , Rfl:    metoCLOPramide (REGLAN) 5 MG/5ML solution, Take 5 mg by mouth 4 (four) times daily -  before meals and at bedtime., Disp: , Rfl:    metoprolol tartrate (LOPRESSOR) 25 MG tablet, Take 25 mg by mouth 2 (two) times daily., Disp: , Rfl:    oxyCODONE-acetaminophen (PERCOCET/ROXICET) 5-325 MG tablet, Take 1 tablet by mouth every 8 (eight) hours as needed for severe pain. (Patient not taking: Reported on 03/31/2022), Disp: , Rfl:    pramipexole (MIRAPEX) 0.5 MG tablet, Take 0.5 mg by mouth at bedtime., Disp: , Rfl:    pregabalin (LYRICA) 100 MG capsule, Take  1 capsule (100 mg total) by mouth 3 (three) times daily., Disp: 90 capsule, Rfl: 5   promethazine (PHENERGAN) 12.5 MG tablet, Take 12.5 mg by mouth., Disp: , Rfl:    QUEtiapine (SEROQUEL) 300 MG tablet, TAKE ONE TABLET BY MOUTH AT BEDTIME., Disp: 30 tablet, Rfl: 5   TRADJENTA 5 MG TABS tablet, Take 5 mg by mouth daily., Disp: , Rfl:    zinc sulfate 220 (50 Zn) MG capsule, Take 220 mg by mouth at bedtime. , Disp: , Rfl:    zolpidem (AMBIEN) 5 MG tablet, Take 1 tablet (5 mg total) by mouth at bedtime., Disp: 30 tablet, Rfl: 5  Past Medical History: Past Medical History:  Diagnosis Date   Abdominal pain 02/12/2019   Abdominal wall hernia 01/29/2013   Acute metabolic  encephalopathy 07/08/2019   AMS (altered mental status) 12/28/2021   Anxiety    Arthritis    Rheumatoid   C. difficile colitis    Chronic diastolic heart failure (HCC)    COVID-19 03/23/2019   Diagnosed at [facility] (send-out) on 03/23/2019   Depression    Diabetes mellitus    states no meds or diet restrictions  at present   Diastolic CHF (HCC)    Esophagitis    Fluid retention    GERD (gastroesophageal reflux disease)    Hiatal hernia    Hypertension    Hypokalemia due to loss of potassium 10/21/2015   Overview:  Associated with 3 weeks of diarrhea  And QT prolongation.   Hypothyroidism    IBS (irritable bowel syndrome)    Moderate episode of recurrent major depressive disorder (HCC) 06/03/2004   Morbid obesity (HCC)    MRSA (methicillin resistant Staphylococcus aureus) infection 11/2017   left inner thigh abcess   Neurogenic bladder    has pacemaker   Neuropathy    Obesity    Panic attacks    Pneumonia due to COVID-19 virus    Rheumatoid arthritis (HCC)    Sleep apnea    STATES SEVERE, CANT TOLERATE MASK- LAST STUDY YEARS AGO    Tobacco Use: Social History   Tobacco Use  Smoking Status Former   Packs/day: 2.00   Years: 27.00   Total pack years: 54.00   Types: Cigarettes   Quit date: 07/30/1999   Years since quitting: 22.7  Smokeless Tobacco Never  Tobacco Comments   quit in 2001 2-2.5 a day    Labs: Review Flowsheet  More data exists      Latest Ref Rng & Units 07/09/2019 07/10/2019 01/10/2020 12/27/2021 01/04/2022  Labs for ITP Cardiac and Pulmonary Rehab  Hemoglobin A1c 4.8 - 5.6 % - - 10.8  - 6.8   PH, Arterial 7.350 - 7.450 7.38  7.48  - - -  PCO2 arterial 32.0 - 48.0 mmHg 36  25  - - -  Bicarbonate 20.0 - 28.0 mmol/L 21.3  18.6  - 27.2  -  Acid-base deficit 0.0 - 2.0 mmol/L 3.3  3.3  - - -  O2 Saturation % 96.6  97.3  - 65.4  -     Pulmonary Assessment Scores:  Pulmonary Assessment Scores     Row Name 04/05/22 1713         ADL UCSD   ADL Phase Entry      SOB Score total 81     Rest 2     Walk 3     Stairs 5     Bath 4     Dress 3  Shop 3       CAT Score   CAT Score 22       mMRC Score   mMRC Score 2              UCSD: Self-administered rating of dyspnea associated with activities of daily living (ADLs) 6-point scale (0 = "not at all" to 5 = "maximal or unable to do because of breathlessness")  Scoring Scores range from 0 to 120.  Minimally important difference is 5 units  CAT: CAT can identify the health impairment of COPD patients and is better correlated with disease progression.  CAT has a scoring range of zero to 40. The CAT score is classified into four groups of low (less than 10), medium (10 - 20), high (21-30) and very high (31-40) based on the impact level of disease on health status. A CAT score over 10 suggests significant symptoms.  A worsening CAT score could be explained by an exacerbation, poor medication adherence, poor inhaler technique, or progression of COPD or comorbid conditions.  CAT MCID is 2 points  mMRC: mMRC (Modified Medical Research Council) Dyspnea Scale is used to assess the degree of baseline functional disability in patients of respiratory disease due to dyspnea. No minimal important difference is established. A decrease in score of 1 point or greater is considered a positive change.   Pulmonary Function Assessment:  Pulmonary Function Assessment - 03/31/22 1422       Breath   Shortness of Breath Yes;Limiting activity;Panic with Shortness of Breath             Exercise Target Goals: Exercise Program Goal: Individual exercise prescription set using results from initial 6 min walk test and THRR while considering  patient's activity barriers and safety.   Exercise Prescription Goal: Initial exercise prescription builds to 30-45 minutes a day of aerobic activity, 2-3 days per week.  Home exercise guidelines will be given to patient during program as part of exercise prescription  that the participant will acknowledge.  Education: Aerobic Exercise: - Group verbal and visual presentation on the components of exercise prescription. Introduces F.I.T.T principle from ACSM for exercise prescriptions.  Reviews F.I.T.T. principles of aerobic exercise including progression. Written material given at graduation.   Education: Resistance Exercise: - Group verbal and visual presentation on the components of exercise prescription. Introduces F.I.T.T principle from ACSM for exercise prescriptions  Reviews F.I.T.T. principles of resistance exercise including progression. Written material given at graduation.    Education: Exercise & Equipment Safety: - Individual verbal instruction and demonstration of equipment use and safety with use of the equipment. Flowsheet Row Pulmonary Rehab from 04/05/2022 in [facility] Cardiac and Pulmonary Rehab  Date 04/05/22  Educator [facility]  Instruction Review Code 1- Verbalizes Understanding       Education: Exercise Physiology & General Exercise Guidelines: - Group verbal and written instruction with models to review the exercise physiology of the cardiovascular system and associated critical values. Provides general exercise guidelines with specific guidelines to those with heart or lung disease.    Education: Flexibility, Balance, Mind/Body Relaxation: - Group verbal and visual presentation with interactive activity on the components of exercise prescription. Introduces F.I.T.T principle from ACSM for exercise prescriptions. Reviews F.I.T.T. principles of flexibility and balance exercise training including progression. Also discusses the mind body connection.  Reviews various relaxation techniques to help reduce and manage stress (i.e. Deep breathing, progressive muscle relaxation, and visualization). Balance handout provided to take home. Written material given at graduation.   Activity  Barriers & Risk Stratification:  Activity Barriers & Cardiac Risk  Stratification - 04/05/22 1702       Activity Barriers & Cardiac Risk Stratification   Activity Barriers Shortness of Breath;History of Falls;Deconditioning;Assistive Device;Balance Concerns;Joint Problems;Arthritis             6 Minute Walk:  6 Minute Walk     Row Name 04/05/22 1659         6 Minute Walk   Phase Initial     Distance 530 feet     Walk Time 6 minutes     # of Rest Breaks 0     MPH 1     METS 1     RPE 15     Perceived Dyspnea  3     VO2 Peak 2     Symptoms Yes (comment)     Comments SOB     Resting HR 62 bpm     Resting BP 120/70     Resting Oxygen Saturation  95 %     Exercise Oxygen Saturation  during 6 min walk 91 %     Max Ex. HR 77 bpm     Max Ex. BP 130/70     2 Minute Post BP 118/82       Interval HR   1 Minute HR 69     2 Minute HR 75     3 Minute HR 77     4 Minute HR 77     5 Minute HR 77     6 Minute HR 73     2 Minute Post HR 69     Interval Heart Rate? Yes       Interval Oxygen   Interval Oxygen? Yes     Baseline Oxygen Saturation % 95 %     1 Minute Oxygen Saturation % 95 %     1 Minute Liters of Oxygen 3 L     2 Minute Oxygen Saturation % 91 %     2 Minute Liters of Oxygen 3 L     3 Minute Oxygen Saturation % 91 %     3 Minute Liters of Oxygen 3 L     4 Minute Oxygen Saturation % 92 %     4 Minute Liters of Oxygen 3 L     5 Minute Oxygen Saturation % 91 %     5 Minute Liters of Oxygen 3 L     6 Minute Oxygen Saturation % 92 %     6 Minute Liters of Oxygen 3 L     2 Minute Post Oxygen Saturation % 97 %     2 Minute Post Liters of Oxygen 3 L             Oxygen Initial Assessment:  Oxygen Initial Assessment - 03/31/22 1421       Home Oxygen   Home Oxygen Device Home Concentrator;E-Tanks;Portable Concentrator    Sleep Oxygen Prescription Continuous    Liters per minute 3    Home Exercise Oxygen Prescription Continuous    Liters per minute 3    Home Resting Oxygen Prescription Continuous    Liters per minute  3    Compliance with Home Oxygen Use Yes      Initial 6 min Walk   Oxygen Used Continuous    Liters per minute 3      Program Oxygen Prescription   Program Oxygen Prescription Continuous    Liters per minute 3  Intervention   Short Term Goals To learn and exhibit compliance with exercise, home and travel O2 prescription;To learn and understand importance of maintaining oxygen saturations>88%;To learn and demonstrate proper use of respiratory medications;To learn and understand importance of monitoring SPO2 with pulse oximeter and demonstrate accurate use of the pulse oximeter.;To learn and demonstrate proper pursed lip breathing techniques or other breathing techniques.     Long  Term Goals Verbalizes importance of monitoring SPO2 with pulse oximeter and return demonstration;Exhibits proper breathing techniques, such as pursed lip breathing or other method taught during program session;Demonstrates proper use of MDI's;Compliance with respiratory medication;Maintenance of O2 saturations>88%;Exhibits compliance with exercise, home  and travel O2 prescription             Oxygen Re-Evaluation:  Oxygen Re-Evaluation     Row Name 04/07/22 1626             Goals/Expected Outcomes   Short Term Goals To learn and exhibit compliance with exercise, home and travel O2 prescription;To learn and understand importance of maintaining oxygen saturations>88%;To learn and demonstrate proper use of respiratory medications;To learn and understand importance of monitoring SPO2 with pulse oximeter and demonstrate accurate use of the pulse oximeter.;To learn and demonstrate proper pursed lip breathing techniques or other breathing techniques.        Long  Term Goals Verbalizes importance of monitoring SPO2 with pulse oximeter and return demonstration;Exhibits proper breathing techniques, such as pursed lip breathing or other method taught during program session;Demonstrates proper use of MDI's;Compliance  with respiratory medication;Maintenance of O2 saturations>88%;Exhibits compliance with exercise, home  and travel O2 prescription       Comments Reviewed PLB technique with pt.  Talked about how it works and it's importance in maintaining their exercise saturations.       Goals/Expected Outcomes Short: Become more profiecient at using PLB.   Long: Become independent at using PLB.                Oxygen Discharge (Final Oxygen Re-Evaluation):  Oxygen Re-Evaluation - 04/07/22 1626       Goals/Expected Outcomes   Short Term Goals To learn and exhibit compliance with exercise, home and travel O2 prescription;To learn and understand importance of maintaining oxygen saturations>88%;To learn and demonstrate proper use of respiratory medications;To learn and understand importance of monitoring SPO2 with pulse oximeter and demonstrate accurate use of the pulse oximeter.;To learn and demonstrate proper pursed lip breathing techniques or other breathing techniques.     Long  Term Goals Verbalizes importance of monitoring SPO2 with pulse oximeter and return demonstration;Exhibits proper breathing techniques, such as pursed lip breathing or other method taught during program session;Demonstrates proper use of MDI's;Compliance with respiratory medication;Maintenance of O2 saturations>88%;Exhibits compliance with exercise, home  and travel O2 prescription    Comments Reviewed PLB technique with pt.  Talked about how it works and it's importance in maintaining their exercise saturations.    Goals/Expected Outcomes Short: Become more profiecient at using PLB.   Long: Become independent at using PLB.             Initial Exercise Prescription:  Initial Exercise Prescription - 04/05/22 1700       Date of Initial Exercise RX and Referring Provider   Date 04/05/22    Referring Provider Francene Boyers      Oxygen   Oxygen Continuous    Liters 3    Maintain Oxygen Saturation 88% or higher      NuStep    Level 1  SPM 80    Minutes 15    METs 1      Biostep-RELP   Level 1    SPM 80    Minutes 15    METs 1      Track   Laps 5    Minutes 15    METs 1.27      Prescription Details   Frequency (times per week) 2    Duration Progress to 30 minutes of continuous aerobic without signs/symptoms of physical distress      Intensity   THRR 40-80% of Max Heartrate 101-140    Ratings of Perceived Exertion 11-13    Perceived Dyspnea 0-4      Progression   Progression Continue to progress workloads to maintain intensity without signs/symptoms of physical distress.      Resistance Training   Training Prescription Yes    Weight 2    Reps 10-15             Perform Capillary Blood Glucose checks as needed.  Exercise Prescription Changes:   Exercise Prescription Changes     Row Name 04/05/22 1700             Response to Exercise   Blood Pressure (Admit) 120/70       Blood Pressure (Exercise) 130/70       Blood Pressure (Exit) 118/82       Heart Rate (Admit) 62 bpm       Heart Rate (Exercise) 77 bpm       Heart Rate (Exit) 73 bpm       Oxygen Saturation (Admit) 95 %       Oxygen Saturation (Exercise) 91 %       Oxygen Saturation (Exit) 97 %       Rating of Perceived Exertion (Exercise) 15       Perceived Dyspnea (Exercise) 3       Symptoms SOB       Comments 6 MWT results                Exercise Comments:   Exercise Comments     Row Name 04/07/22 1623           Exercise Comments First full day of exercise!  Patient was oriented to gym and equipment including functions, settings, policies, and procedures.  Patient's individual exercise prescription and treatment plan were reviewed.  All starting workloads were established based on the results of the 6 minute walk test done at initial orientation visit.  The plan for exercise progression was also introduced and progression will be customized based on patient's performance and goals.                 Exercise Goals and Review:   Exercise Goals     Row Name 04/05/22 1709             Exercise Goals   Increase Physical Activity Yes       Intervention Provide advice, education, support and counseling about physical activity/exercise needs.;Develop an individualized exercise prescription for aerobic and resistive training based on initial evaluation findings, risk stratification, comorbidities and participant's personal goals.       Expected Outcomes Short Term: Attend rehab on a regular basis to increase amount of physical activity.;Long Term: Add in home exercise to make exercise part of routine and to increase amount of physical activity.;Long Term: Exercising regularly at least 3-5 days a week.       Increase Strength and  Stamina Yes       Intervention Provide advice, education, support and counseling about physical activity/exercise needs.;Develop an individualized exercise prescription for aerobic and resistive training based on initial evaluation findings, risk stratification, comorbidities and participant's personal goals.       Expected Outcomes Short Term: Increase workloads from initial exercise prescription for resistance, speed, and METs.;Short Term: Perform resistance training exercises routinely during rehab and add in resistance training at home;Long Term: Improve cardiorespiratory fitness, muscular endurance and strength as measured by increased METs and functional capacity ( )       Able to understand and use rate of perceived exertion (RPE) scale Yes       Intervention Provide education and explanation on how to use RPE scale       Expected Outcomes Short Term: Able to use RPE daily in rehab to express subjective intensity level;Long Term:  Able to use RPE to guide intensity level when exercising independently       Able to understand and use Dyspnea scale Yes       Intervention Provide education and explanation on how to use Dyspnea scale       Expected Outcomes  Short Term: Able to use Dyspnea scale daily in rehab to express subjective sense of shortness of breath during exertion;Long Term: Able to use Dyspnea scale to guide intensity level when exercising independently       Knowledge and understanding of Target Heart Rate Range (THRR) Yes       Intervention Provide education and explanation of THRR including how the numbers were predicted and where they are located for reference       Expected Outcomes Short Term: Able to state/look up THRR;Long Term: Able to use THRR to govern intensity when exercising independently;Short Term: Able to use daily as guideline for intensity in rehab       Able to check pulse independently Yes       Intervention Provide education and demonstration on how to check pulse in carotid and radial arteries.;Review the importance of being able to check your own pulse for safety during independent exercise       Expected Outcomes Short Term: Able to explain why pulse checking is important during independent exercise;Long Term: Able to check pulse independently and accurately       Understanding of Exercise Prescription Yes       Intervention Provide education, explanation, and written materials on patient's individual exercise prescription       Expected Outcomes Short Term: Able to explain program exercise prescription;Long Term: Able to explain home exercise prescription to exercise independently                Exercise Goals Re-Evaluation :  Exercise Goals Re-Evaluation     Row Name 04/07/22 1624             Exercise Goal Re-Evaluation   Exercise Goals Review Increase Physical Activity;Able to understand and use rate of perceived exertion (RPE) scale;Knowledge and understanding of Target Heart Rate Range (THRR);Understanding of Exercise Prescription;Able to understand and use Dyspnea scale;Able to check pulse independently;Increase Strength and Stamina       Comments Reviewed RPE scale, THR and program prescription  with pt today.  Pt voiced understanding and was given a copy of goals to take home.       Expected Outcomes Short: Use RPE daily to regulate intensity.  Long: Follow program prescription in THR.  Discharge Exercise Prescription (Final Exercise Prescription Changes):  Exercise Prescription Changes - 04/05/22 1700       Response to Exercise   Blood Pressure (Admit) 120/70    Blood Pressure (Exercise) 130/70    Blood Pressure (Exit) 118/82    Heart Rate (Admit) 62 bpm    Heart Rate (Exercise) 77 bpm    Heart Rate (Exit) 73 bpm    Oxygen Saturation (Admit) 95 %    Oxygen Saturation (Exercise) 91 %    Oxygen Saturation (Exit) 97 %    Rating of Perceived Exertion (Exercise) 15    Perceived Dyspnea (Exercise) 3    Symptoms SOB    Comments 6 MWT results             Nutrition:  Target Goals: Understanding of nutrition guidelines, daily intake of sodium 1500mg , cholesterol 200mg , calories 30% from fat and 7% or less from saturated fats, daily to have 5 or more servings of fruits and vegetables.  Education: All About Nutrition: -Group instruction provided by verbal, written material, interactive activities, discussions, models, and posters to present general guidelines for heart healthy nutrition including fat, fiber, MyPlate, the role of sodium in heart healthy nutrition, utilization of the nutrition label, and utilization of this knowledge for meal planning. Follow up email sent as well. Written material given at graduation.   Biometrics:  Pre Biometrics - 04/05/22 1710       Pre Biometrics   Height 5' 6.75" (1.695 m)    Weight 318 lb 1.6 oz (144.3 kg)    BMI (Calculated) 50.22    Single Leg Stand 0 seconds              Nutrition Therapy Plan and Nutrition Goals:  Nutrition Therapy & Goals - 04/05/22 1546       Nutrition Therapy   Diet Heart healthy, low Na, T2DM MNT    Drug/Food Interactions Statins/Certain Fruits    Protein (specify units)  110g    Fiber 25 grams    Whole Grain Foods 3 servings    Saturated Fats 12 max. grams    Fruits and Vegetables 8 servings/day    Sodium 2 grams      Personal Nutrition Goals   Nutrition Goal ST: limit sodium/read food labels, practice MyPlate guidelines, cut down more on regular soda, and swap out her bacon grease and butter for liquid plant oils when cooking. LT: limit Na <2g/day, limit added sugar <24g/day, limit saturated fat <12g/day, follow MyPlate guidelines.    Comments 61 y.o. F admitted to pulmonary rehab for pulmonary fibrosis. PMHx includes HTN, HLD, CHF, PAF, COPD, gatroparesis, hypothyroidism, T2DM, rheumatoid arthritis, former smoker, anxiety/depression, bipolar disorder. Relevant medications reviewed 04/05/22. PYP Score: 44. Vegetables & Fruits 9/12. Breads, Grains & Cereals 7/12. Red & Processed Meat 7/12. Poultry 2/2. Fish & Shellfish 1/4. Beans, Nuts & Seeds 1/4. Milk & Dairy Foods 1/6. Toppings, Oils, Seasonings & Salt 3/20. Sweets, Snacks & Restaurant Food 8/14. Beverages 5/10. [name] feels that her downfall is regular soda - 35 oz/day - she has cut down, she used to drink 2L/day; she continues to cut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

## 2022-04-15 ENCOUNTER — Other Ambulatory Visit: Payer: Medicaid Other | Admitting: Licensed Clinical Social Worker

## 2022-04-15 NOTE — Patient Outreach (Signed)
  Medicaid Managed Care   Unsuccessful Attempt Note   04/15/2022 Name: [name] [name] MRN: [id number] DOB: [birth year]  Referred by: Etheleen Nicks, NP Reason for referral : No chief complaint on file.   An unsuccessful telephone outreach was attempted today. The patient was referred to the case management team for assistance with care management and care coordination.    Follow Up Plan: A HIPAA compliant phone message was left for the patient providing contact information and requesting a return call.   Dickie La, BSW, MSW, [employer] Managed Medicaid LCSW [facility]  [facility] [email or web address] Phone: [phone]

## 2022-04-15 NOTE — Patient Instructions (Signed)
[name] [name] ,   The [facility] Managed Care Team is available to provide assistance to you with your healthcare needs at no cost and as a benefit of your [facility] Health plan. I'm sorry I was unable to reach you today for our scheduled appointment. Our care guide will call you to reschedule our telephone appointment. Please call me at the number below. I am available to be of assistance to you regarding your healthcare needs. .   Thank you,   Dickie La, BSW, MSW, LCSW Managed Medicaid LCSW [facility]  [address] [email or web address] Phone: [phone]

## 2022-04-21 ENCOUNTER — Encounter: Payer: Medicaid Other | Attending: Pulmonary Disease

## 2022-04-21 ENCOUNTER — Other Ambulatory Visit: Payer: Medicaid Other | Admitting: *Deleted

## 2022-04-21 DIAGNOSIS — J8410 Pulmonary fibrosis, unspecified: Secondary | ICD-10-CM | POA: Insufficient documentation

## 2022-04-21 NOTE — Patient Outreach (Signed)
  Medicaid Managed Care   Unsuccessful Attempt Note   04/21/2022 Name: [name] [name] MRN: [id number] DOB: Jul 27, 1960  Referred by: Verita Lamb, NP Reason for referral : High Risk Managed Medicaid (Unsuccessful RNCM follow up telephone outreach)   An unsuccessful telephone outreach was attempted today. The patient was referred to the case management team for assistance with care management and care coordination.    Follow Up Plan: A HIPAA compliant phone message was left for the patient providing contact information and requesting a return call. and The Managed Medicaid care management team will reach out to the patient again over the next 14 days.    Lurena Joiner RN, BSN [place]  [employer] manager

## 2022-04-21 NOTE — Patient Instructions (Signed)
Visit Information  [name]  - as a part of your Medicaid benefit, you are eligible for care management and care coordination services at no cost or copay. I was unable to reach you by phone today but would be happy to help you with your health related needs. Please feel free to call me @ [phone].   A member of the Managed Medicaid care management team will reach out to you again over the next 14 days.   Lurena Joiner RN, BSN [place]  [employer] manager

## 2022-04-23 MED ORDER — PRAMIPEXOLE 0.5 MG TABLET
ORAL_TABLET | Freq: Every evening | ORAL | 1 refills | 90 days | Status: CP
Start: 2022-04-23 — End: 2023-04-23

## 2022-04-24 MED ORDER — PRAMIPEXOLE 0.125 MG TABLET
ORAL_TABLET | 2 refills | 0 days
Start: 2022-04-24 — End: ?

## 2022-04-26 ENCOUNTER — Encounter: Payer: Medicaid Other | Admitting: *Deleted

## 2022-04-26 DIAGNOSIS — J8410 Pulmonary fibrosis, unspecified: Secondary | ICD-10-CM | POA: Diagnosis present

## 2022-04-26 LAB — GLUCOSE, CAPILLARY
Glucose-Capillary: 134 mg/dL — ABNORMAL HIGH (ref 70–99)
Glucose-Capillary: 118 mg/dL — ABNORMAL HIGH (ref 70–99)

## 2022-04-26 MED ORDER — PRAMIPEXOLE 0.125 MG TABLET
ORAL_TABLET | 2 refills | 0 days
Start: 2022-04-26 — End: ?

## 2022-04-26 NOTE — Progress Notes (Signed)
Daily Session Note  Patient Details  Name: [name] [name] MRN: [id number] Date of Birth: 1960/11/07 Referring Provider:   Flowsheet Row Pulmonary Rehab from 04/05/2022 in [facility] Cardiac and Pulmonary Rehab  Referring Provider Galen Daft       Encounter Date: 04/26/2022  Check In:  Session Check In - 04/26/22 1604       Check-In   Supervising physician immediately available to respond to emergencies See telemetry face sheet for immediately available ER MD    Location [facility]-Cardiac & Pulmonary Rehab    Staff Present Renita Papa, RN BSN;Joseph Tessie Fass, RCP,RRT,BSRT;Noah [place], [place], Exercise Physiologist    Virtual Visit No    Medication changes reported     No    Fall or balance concerns reported    No    Warm-up and Cool-down Performed on first and last piece of equipment    Resistance Training Performed Yes    VAD Patient? No    PAD/SET Patient? No      Pain Assessment   Currently in Pain? No/denies                Social History   Tobacco Use  Smoking Status Former   Packs/day: 2.00   Years: 27.00   Total pack years: 54.00   Types: Cigarettes   Quit date: 07/30/1999   Years since quitting: 22.7  Smokeless Tobacco Never  Tobacco Comments   quit in 2001 2-2.5 a day    Goals Met:  Independence with exercise equipment Exercise tolerated well No report of concerns or symptoms today Strength training completed today  Goals Unmet:  Not Applicable  Comments: Pt able to follow exercise prescription today without complaint.  Will continue to monitor for progression.    Dr. Emily Filbert is Medical Director for [place].  Dr. Ottie Glazier is Medical Director for [facility] Pulmonary Rehabilitation.

## 2022-04-28 ENCOUNTER — Other Ambulatory Visit: Payer: Medicaid Other | Admitting: Licensed Clinical Social Worker

## 2022-04-28 ENCOUNTER — Encounter: Payer: Medicaid Other | Admitting: *Deleted

## 2022-04-28 DIAGNOSIS — J8410 Pulmonary fibrosis, unspecified: Secondary | ICD-10-CM | POA: Diagnosis not present

## 2022-04-28 LAB — GLUCOSE, CAPILLARY
Glucose-Capillary: 148 mg/dL — ABNORMAL HIGH (ref 70–99)
Glucose-Capillary: 126 mg/dL — ABNORMAL HIGH (ref 70–99)

## 2022-04-28 NOTE — Patient Instructions (Signed)
Visit Information  [name] [name] was given information about Medicaid Managed Care team care coordination services as a part of their [place] Complete Medicaid benefit. [name] verbally consented to engagement with the [facility] Managed Care team.   If you are experiencing a medical emergency, please call 911 or report to your local emergency department or urgent care.   If you have a non-emergency medical problem during routine business hours, please contact your provider's office and ask to speak with a nurse.   For questions related to your [place] Complete Medicaid health plan, please call: [phone]  If you would like to schedule transportation through your [place] Complete Medicaid plan, please call the following number at least 2 days in advance of your appointment: [phone].   There is no limit to the number of trips during the year between medical appointments, healthcare facilities, or pharmacies. Transportation must be scheduled at least 2 business days before but not more than thirty 30 days before of your appointment.  Call the [place] at [phone], at any time, 24 hours a day, 7 days a week. If you are in danger or need immediate medical attention call 911.  If you would like help to quit smoking, call 1-800-QUIT-NOW (307)242-8233) OR Espaol: 1-855-Djelo-Ya (0-277-412-8786) o para ms informacin haga clic aqu or Text READY to 200-400 to register via text   Following is a copy of your plan of care:  Care Plan : LCSW Plan of Care  Updates made by [name], LCSW since 04/28/2022 12:00 AM     Problem: Depression Identification (Depression)      Long-Range Goal: Depressive Symptoms Identified   Start Date: 03/19/2022  Priority: High  Note:   Priority: High  Timeframe:  Long-Range Goal Priority:  High Start Date:   03/19/22           Expected End Date:  ongoing                     Follow Up Date--04/15/22 at 1 pm  - keep  90 percent of scheduled appointments -consider counseling or psychiatry -consider bumping up your self-care  -consider creating a stronger support network   Why is this important?             Beating depression may take some time.            If you don't feel better right away, don't give up on your treatment plan.    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 Patient requires Support, Education, Resources, Referrals, Advocacy, and Care Coordination, in order to meet Unmet Mental Health Needs. Patient will implement clinical interventions discussed today to decrease symptoms and increase knowledge and/or ability of: coping skills. Mental Health Concerns and Social Isolation Patient lacks knowledge of available community counseling agencies and resources.  Clinical Goal(s): verbalize understanding of plan for management of Anxiety, Depression, and Stress and demonstrate a reduction in symptoms. Patient will connect with a provider for ongoing mental health treatment, increase coping skills, healthy habits, self-management skills, and stress reduction        Patient Goals/Self-Care Activities: Over the next 120 days Attend scheduled medical appointments Utilize healthy coping skills and supportive resources discussed Contact PCP with any questions or concerns Keep 90 percent of counseling appointments Call your insurance provider for more information about your Enhanced Benefits  Check out counseling resources provided  Begin personal counseling with  LCSW, to reduce and manage symptoms of Depression and Stress, until well-established with mental health provider Accept all calls from representative with [employer] in an effort to establish ongoing mental health counseling and supportive services. Incorporate into daily practice - relaxation techniques, deep breathing exercises, and mindfulness meditation strategies. Talk about feelings with friends,  family members, spiritual advisor, etc. Contact LCSW directly [phone]), if you have questions, need assistance, or if additional social work needs are identified between now and our next scheduled telephone outreach call. Call [phone] for mental health hotline/crisis line if needed (24/7 available) Try techniques to reduce symptoms of anxiety/negative thinking (deep breathing, distraction, positive self talk, etc)  - develop a personal safety plan - develop a plan to deal with triggers like holidays, anniversaries - exercise at least 2 to 3 times per week - have a plan for how to handle bad days - journal feelings and what helps to feel better or worse - spend time or talk with others at least 2 to 3 times per week - watch for early signs of feeling worse - begin personal counseling - call and visit an old friend - check out volunteer opportunities - join a support group - laugh; watch a funny movie or comedian - learn and use visualization or guided imagery - perform a random act of kindness - practice relaxation or meditation daily - start or continue a personal journal - practice positive thinking and self-talk -continue with compliance of taking medication  -identify current effective and ineffective coping strategies.  -implement positive self-talk in care to increase self-esteem, confidence and feelings of control.  -consider alternative and complementary therapy approaches such as meditation, mindfulness or yoga.  -journaling, prayer, worship services, meditation or pastoral counseling.  -increase participation in pleasurable group activities such as hobbies, singing, sports or volunteering).  -consider the use of meditative movement therapy such as tai chi, yoga or qigong.  -start a regular daily exercise program based on tolerance, ability and patient choice to support positive thinking and activity    The following coping skill education was provided for stress relief and  mental health management: "When your car dies or a deadline looms, how do you respond? Long-term, low-grade or acute stress takes a serious toll on your body and mind, so don't ignore feelings of constant tension. Stress is a natural part of life. However, too much stress can harm our health, especially if it continues every day. This is chronic stress and can put you at risk for heart problems like heart disease and depression. Understand what's happening inside your body and learn simple coping skills to combat the negative impacts of everyday stressors.  Types of Stress There are two types of stress: Emotional - types of emotional stress are relationship problems, pressure at work, financial worries, experiencing discrimination or having a major life change. Physical - Examples of physical stress include being sick having pain, not sleeping well, recovery from an injury or having an alcohol and drug use disorder. Fight or Flight Sudden or ongoing stress activates your nervous system and floods your bloodstream with adrenaline and cortisol, two hormones that raise blood pressure, increase heart rate and spike blood sugar. These changes pitch your body into a fight or flight response. That enabled our ancestors to outrun saber-toothed tigers, and it's helpful today for situations like dodging a car accident. But most modern chronic stressors, such as finances or a challenging relationship, keep your body in that heightened state, which hurts your health. Effects  of Too Much Stress If constantly under stress, most of [name] will eventually start to function less well.  Multiple studies link chronic stress to a higher risk of heart disease, stroke, depression, weight gain, memory loss and even premature death, so it's important to recognize the warning signals. Talk to your doctor about ways to manage stress if you're experiencing any of these symptoms: Prolonged periods of poor sleep. Regular, severe  headaches. Unexplained weight loss or gain. Feelings of isolation, withdrawal or worthlessness. Constant anger and irritability. Loss of interest in activities. Constant worrying or obsessive thinking. Excessive alcohol or drug use. Inability to concentrate.  10 Ways to Cope with Chronic Stress It's key to recognize stressful situations as they occur because it allows you to focus on managing how you react. We all need to know when to close our eyes and take a deep breath when we feel tension rising. Use these tips to prevent or reduce chronic stress. 1. Rebalance Work and Home All work and no play? If you're spending too much time at the office, intentionally put more dates in your calendar to enjoy time for fun, either alone or with others. 2. Get Regular Exercise Moving your body on a regular basis balances the nervous system and increases blood circulation, helping to flush out stress hormones. Even a daily 20-minute walk makes a difference. Any kind of exercise can lower stress and improve your mood ? just pick activities that you enjoy and make it a regular habit. 3. Eat Well and Limit Alcohol and Stimulants Alcohol, nicotine and caffeine may temporarily relieve stress but have negative health impacts and can make stress worse in the long run. Well-nourished bodies cope better, so start with a good breakfast, add more organic fruits and vegetables for a well-balanced diet, avoid processed foods and sugar, try herbal tea and drink more water. 4. Connect with Supportive People Talking face to face with another person releases hormones that reduce stress. Lean on those good listeners in your life. 5. [place] Time Do you enjoy gardening, reading, listening to music or some other creative pursuit? Engage in activities that bring you pleasure and joy; research shows that reduces stress by almost half and lowers your heart rate, too. 6. Practice Meditation, Stress Reduction or  Yoga Relaxation techniques activate a state of restfulness that counterbalances your body's fight-or-flight hormones. Even if this also means a 10-minute break in a long day: listen to music, read, go for a walk in nature, do a hobby, take a bath or spend time with a friend. Also consider doing a mindfulness exercise or try a daily deep breathing or imagery practice. Deep Breathing Slow, calm and deep breathing can help you relax. Try these steps to focus on your breathing and repeat as needed. Find a comfortable position and close your eyes. Exhale and drop your shoulders. Breathe in through your nose; fill your lungs and then your belly. Think of relaxing your body, quieting your mind and becoming calm and peaceful. Breathe out slowly through your nose, relaxing your belly. Think of releasing tension, pain, worries or distress. Repeat steps three and four until you feel relaxed. Imagery This involves using your mind to excite the senses -- sound, vision, smell, taste and feeling. This may help ease your stress. Begin by getting comfortable and then do some slow breathing. Imagine a place you love being at. It could be somewhere from your childhood, somewhere you vacationed or just a place in your imagination. Feel  how it is to be in the place you're imagining. Pay attention to the sounds, air, colors, and who is there with you. This is a place where you feel cared for and loved. All is well. You are safe. Take in all the smells, sounds, tastes and feelings. As you do, feel your body being nourished and healed. Feel the calm that surrounds you. Breathe in all the good. Breathe out any discomfort or tension. 7. Sleep Enough If you get less than seven to eight hours of sleep, your body won't tolerate stress as well as it could. If stress keeps you up at night, address the cause, and add extra meditation into your day to make up for the lost z's. Try to get seven to nine hours of sleep each night.  Make a regular bedtime schedule. Keep your room dark and cool. Try to avoid computers, TV, cell phones and tablets before bed. 8. Bond with Connections You Enjoy Go out for a coffee with a friend, chat with a neighbor, call a family member, visit with a clergy member, or even hang out with your pet. Clinical studies show that spending even a short time with a companion animal can cut anxiety levels almost in half. 9. Take a Vacation Getting away from it all can reset your stress tolerance by increasing your mental and emotional outlook, which makes you a happier, more productive person upon return. Leave your cellphone and laptop at home! 10. See a Counselor, Coach or Therapist If negative thoughts overwhelm your ability to make positive changes, it's time to seek professional help. Make an appointment today--your health and life are worth it."  If you are experiencing a Mental Health or [place] or need someone to talk to, please call the Suicide and Crisis Lifeline: [phone]

## 2022-04-28 NOTE — Patient Outreach (Signed)
Medicaid Managed Care Social Work Note  04/28/2022 Name:  [name] [name] MRN:  [id number] DOB:  Aug 31, 1960  [name] [name] is an 62 y.o. year old female who is a primary patient of Etheleen NicksMacDonald, Keri, NP.  The Medicaid Managed Care Coordination team was consulted for assistance with:  Mental Health Counseling and Resources  [name] [name] was given information about Medicaid Managed Care Coordination team services today. [name] [name] Patient agreed to services and verbal consent obtained.  Engaged with patient  for by telephone forfollow up visit in response to referral for case management and/or care coordination services.   Assessments/Interventions:  Review of past medical history, allergies, medications, health status, including review of consultants reports, laboratory and other test data, was performed as part of comprehensive evaluation and provision of chronic care management services.  SDOH: (Social Determinant of Health) assessments and interventions performed: SDOH Interventions    Flowsheet Row Patient Outreach Telephone from 04/28/2022 in [facility] DEPARTMENT Pulmonary Rehab [facility] Patient Outreach Telephone from 03/26/2022 in [facility] POPULATION HEALTH DEPARTMENT Patient Outreach Telephone from 03/19/2022 in [facility] POPULATION HEALTH DEPARTMENT Patient Outreach Telephone from 03/17/2022 in [place] POPULATION HEALTH DEPARTMENT ED to Hosp-Admission (Discharged) from [facility] MEDICAL TELEMETRY  SDOH Interventions        Food Insecurity Interventions -- -- -- -- -- Intervention Not Indicated  Housing Interventions -- -- -- Intervention Not Indicated Intervention Not Indicated Intervention Not Indicated  Transportation Interventions -- -- -- -- Intervention Not Indicated Intervention Not Indicated  Utilities Interventions -- -- -- -- -- Intervention Not Indicated  Depression  Interventions/Treatment  -- Medication -- Referral to Psychiatry Currently on Treatment  [Referral to LCSW] --  Stress Interventions Offered [employer] [employer] [facility] Resources, Provide Counseling -- [employer] Community Wellness Resources, Provide Counseling -- -- --       Advanced Directives Status:  See Care Plan for related entries.  Care Plan                 Allergies  Allergen Reactions   Cephalexin Hives   Codeine Palpitations, Nausea Only, Nausea And Vomiting, Rash and Shortness Of Breath    "makes heart fly, she gets flushed and passes out"   Doxycycline Rash   Propoxyphene Rash and Shortness Of Breath    Increase heart rate   Sulfa Antibiotics Palpitations, Nausea Only, Shortness Of Breath and Hives    "makes heart fly, she gets flushed and passes out"   Clindamycin Rash    Tongue swelling, oral sores, and Mouth rash   Lovenox [Enoxaparin Sodium] Hives   Hydrocodone Nausea And Vomiting    Hear racing & breaks out into a cold sweat.   Meropenem Rash    Erythematous, hot, pruritic rash over arms, chest, back, abdomen, and face occurred at the end of meropenem infusion on 02/22/18    Medications Reviewed Today     Reviewed by Gaynell FaceHood, Joseph A, RRT (Respiratory Therapist) on 03/31/22 at 1418  Med List Status: <None>   Medication Order Taking? Sig Documenting Provider Last Dose Status Informant  acetaminophen (TYLENOL) 325 MG tablet [id number] Yes Take 2 tablets (650 mg total) by mouth every 6 (six) hours as needed for mild pain or moderate pain (or Fever >/= 101). Marguerita MerlesSheikh, Omair ManchesterLatif, [place]DO Taking Active Self  amLODipine (NORVASC) 10 MG tablet [id number] Yes Take 10 mg by mouth daily. [provider] Taking Active Self  Ailene ArdsANORO  ELLIPTA 62.5-25 MCG/ACT AEPB [id number] No 1 puff daily.  Patient not taking: Reported on 03/01/2022   [provider] Not Taking Active Self  ARIPiprazole (ABILIFY) 5 MG tablet [id number] Yes Take 5 mg by mouth daily. [provider] Taking  Active Self  ascorbic acid (VITAMIN C) 500 MG tablet [id number] Yes Take 1 tablet (500 mg total) by mouth daily. Lavina Hamman, MD Taking Active Self  atorvastatin (LIPITOR) 80 MG tablet [id number] Yes Take 80 mg by mouth daily. [provider] Taking Active Self  Budeson-Glycopyrrol-Formoterol (BREZTRI AEROSPHERE) 160-9-4.8 MCG/ACT AERO [id number] Yes Inhale 2 puffs into the lungs in the morning and at bedtime. Tyler Pita, MD Taking Active Self  busPIRone (BUSPAR) 10 MG tablet [id number] Yes Take 1 tablet (10 mg total) by mouth 2 (two) times daily. Clapacs, Madie Reno, MD Taking Active Self  BUTRANS 7.5 MCG/HR [id number] Yes Place 1 patch onto the skin once a week. [provider] Taking Active   Calcium Citrate-Vitamin D3 (GNP CALCIUM CITRATE+D MAXIMUM) 315-6.25 MG-MCG TABS [id number] Yes Take 1 tablet by mouth daily. [provider] Taking Active Self  Cholecalciferol (VITAMIN D) 125 MCG (5000 UT) CAPS [id number] Yes Take 1 capsule by mouth daily. [provider] Taking Active Self  ELIQUIS 5 MG TABS tablet [id number] Yes TAKE 1 TABLET BY MOUTH TWICE DAILY  Patient taking differently: Take 5 mg by mouth 2 (two) times daily.   Tyler Pita, MD Taking Active Self  famotidine (PEPCID) 20 MG tablet [id number] Yes Take 1 tablet (20 mg total) by mouth daily. Lavina Hamman, MD Taking Active Self  FLUoxetine (PROZAC) 40 MG capsule [id number] Yes Take 1 capsule (40 mg total) by mouth daily. Clapacs, Madie Reno, MD Taking Active Self  folic acid (FOLVITE) 1 MG tablet [id number] Yes Take 1 mg by mouth daily.  [provider] Taking Active Self  hydroxychloroquine (PLAQUENIL) 200 MG tablet [id number] Yes Take 200 mg by mouth 2 (two) times daily. [provider] Taking Active Self  levothyroxine (SYNTHROID, LEVOTHROID) 88 MCG tablet [id number] Yes Take 88 mcg by mouth daily before breakfast. [provider] Taking Active Self  metFORMIN (GLUCOPHAGE-XR)  500 MG 24 hr tablet [id number] Yes Take 500 mg by mouth daily. [provider] Taking Active Self  methotrexate (RHEUMATREX) 2.5 MG tablet [id number] Yes Take 2.5 mg by mouth once a week. [provider] Taking Active   metoCLOPramide (REGLAN) 5 MG/5ML solution [id number] Yes Take 5 mg by mouth 4 (four) times daily -  before meals and at bedtime. [provider] Taking Active Self           Med Note (ROBB, MELANIE A   Wed Mar 17, 2022  9:19 AM) Taking 10 mg 4 times daily  metoprolol tartrate (LOPRESSOR) 25 MG tablet [id number] Yes Take 25 mg by mouth 2 (two) times daily. [provider] Taking Active Self  oxyCODONE-acetaminophen (PERCOCET/ROXICET) 5-325 MG tablet [id number] No Take 1 tablet by mouth every 8 (eight) hours as needed for severe pain.  Patient not taking: Reported on 03/31/2022   [provider] Not Taking Active Self  pramipexole (MIRAPEX) 0.5 MG tablet [id number] Yes Take 0.5 mg by mouth at bedtime. [provider] Taking Active   pregabalin (LYRICA) 100 MG capsule [id number] Yes Take 1 capsule (100 mg total) by mouth 3 (three) times daily. Gillis Santa, MD Taking Active Self  promethazine (PHENERGAN) 12.5 MG tablet [id number] Yes Take 12.5 mg by mouth. [provider] Taking  Active Self  QUEtiapine (SEROQUEL) 300 MG tablet [id number] Yes TAKE ONE TABLET BY MOUTH AT BEDTIME. Clapacs, Madie Reno, MD Taking Active Self  TRADJENTA 5 MG TABS tablet [id number] Yes Take 5 mg by mouth daily. [provider] Taking Active Self  zinc sulfate 220 (50 Zn) MG capsule [id number] Yes Take 220 mg by mouth at bedtime.  [provider] Taking Active Self  zolpidem (AMBIEN) 5 MG tablet [id number] Yes Take 1 tablet (5 mg total) by mouth at bedtime. Gonzella Lex, MD Taking Active Self            Patient Active Problem List   Diagnosis Date Noted   Gastroparesis 02/28/2022   Allergy to multiple antibiotics 02/28/2022   Lactic  acidosis 01/05/2022   Sepsis ([place]) 01/04/2022   Acute cystitis 12/27/2021   Morbid obesity ([place])    Depression with anxiety    PAF (paroxysmal atrial fibrillation) ([place])    Chronic respiratory failure with hypoxia ([place]) 02/07/2020   Paroxysmal A-fib ([place])    Morbid obesity with BMI of 50.0-59.9, adult ([place]) 07/08/2019   Controlled type 2 diabetes mellitus without complication, without long-term current use of insulin ([place]) 04/06/2019   CKD (chronic kidney disease), stage III ([place]) 04/06/2019   HLD (hyperlipidemia) 04/06/2019   AKI (acute kidney injury) ([place]) 02/12/2019   Diabetic ulcer of left foot ([place]) 02/14/2018   Chronic pain syndrome 03/31/2016   Rheumatoid arthritis ([place]) 12/25/2015   Osteoarthritis, multiple sites 12/24/2015   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[employer]) 12/23/2015   Long term current use of opiate analgesic 12/23/2015   Long term prescription opiate use 12/23/2015   Diabetic peripheral neuropathy ([place]) 12/23/2015   Chronic diastolic CHF (congestive heart failure) ([place]) 07/03/2014   COPD (chronic obstructive pulmonary disease) ([place]) 01/03/2014   Bipolar disorder, unspecified ([place]) 01/03/2014   Borderline personality disorder ([place]) 01/06/2012   Hypothyroidism 06/28/2010   Rheumatoid arthritis involving multiple sites with positive rheumatoid factor ([place]) 06/28/2010   Essential (primary) hypertension 01/27/2005   Major depressive disorder, recurrent episode, moderate ([place]) 06/03/2004    Conditions to be addressed/monitored per PCP order:  Depression  Care Plan : LCSW Plan of Care  Updates made by [name], LCSW since 04/28/2022 12:00 AM     Problem: Depression Identification (Depression)      Long-Range Goal: Depressive Symptoms Identified   Start Date: 03/19/2022  Priority: High  Note:   Priority: High  Timeframe:  Long-Range Goal Priority:  High Start Date:   03/19/22           Expected End Date:  ongoing                      Follow Up Date--04/15/22 at 1 pm  - keep 90 percent of scheduled appointments -consider counseling or psychiatry -consider bumping up your self-care  -consider creating a stronger support network   Why is this important?             Beating depression may take some time.            If you don't feel better right away, don't give up on your treatment plan.    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 Patient requires Support, Education, Resources, Referrals, Advocacy, and Care Coordination, in order to meet Unmet Mental Health Needs. Patient will implement clinical interventions discussed today to decrease symptoms and increase knowledge and/or ability of: coping skills.  Mental Health Concerns and Social Isolation Patient lacks knowledge of available community counseling agencies and resources.  Clinical Goal(s): verbalize understanding of plan for management of Anxiety, Depression, and Stress and demonstrate a reduction in symptoms. Patient will connect with a provider for ongoing mental health treatment, increase coping skills, healthy habits, self-management skills, and stress reduction        Clinical Interventions:  Assessed patient's previous and current treatment, coping skills, support system and barriers to care. Patient provided hx  Verbalization of feelings encouraged, motivational interviewing employed Emotional support provided, positive coping strategies explored. Establishing healthy boundaries emphasized and healthy self-care education provided Patient was educated on available mental health resources within their area that accept Medicaid and offer counseling and psychiatry. Patient reports that she is on psychotropics already but needs additional mental health follow up due to added stressors.  Patient was successful in identifying triggers to depression symptoms, in addition, to healthy coping skills. Patient reports  significant worsening mood impacting her ability to function appropriately and carry out daily task. Patient is agreeable to referral to [facility] for counseling and psychiatry. [facility] LCSW made referral on 03/19/22. LCSW provided education on relaxation techniques such as meditation, deep breathing, massage, grounding exercises or yoga that can activate the body's relaxation response and ease symptoms of stress and anxiety. LCSW ask that when pt is struggling with difficult emotions and racing thoughts that they start this relaxation response process. LCSW provided extensive education on healthy coping skills for anxiety. SW used active and reflective listening, validated patient's feelings/concerns, and provided emotional support. Patient will work on implementing appropriate self-care habits into their daily routine such as: staying positive, writing a gratitude list, drinking water, staying active around the house, taking their medications as prescribed, combating negative thoughts or emotions and staying connected with their family and friends. Positive reinforcement provided for this decision to work on this. LCSW provided education on healthy sleep hygiene and what that looks like. LCSW encouraged patient to implement a night time routine into their schedule that works best for them and that they are able to maintain. Advised patient to implement deep breathing/grounding/meditation/self-care exercises into their nightly routine to combat racing thoughts at night. LCSW encouraged patient to wake up at the same time each day, make their sleeping environment comfortable, exercise when able, to limit naps and to not eat or drink anything right before bed.  Motivational Interviewing employed Depression screen reviewed  PHQ2/ PHQ9 completed or reviewed  Mindfulness or Relaxation training provided Active listening / Reflection utilized  Advance Care and HCPOA education provided Emotional Support Provided Problem  [place] strategies reviewed Provided psychoeducation for mental health needs  Provided brief CBT  Reviewed mental health medications and discussed importance of compliance:  Quality of sleep assessed & Sleep Hygiene techniques promoted  Participation in counseling encouraged  Verbalization of feelings encouraged  Suicidal Ideation/Homicidal Ideation assessed: Patient denies SI/HI  Review resources, discussed options and provided patient information about  [place] care team collaboration (see longitudinal plan of care) 03/26/22 update- Patient and [facility] LCSW completed joint call to [facility] and was informed that patient will need an additional referral made in order to get both counseling and psychiatry. Referral adjustment made. Per [facility], referral has to be reviewed first before scheduling so patient is unable to schedule her intake appointment just yet. Email sent to patient with resource contact information. [facility] staff stated that they are booked up until February of 2024. UPDATE- Patient report no  longer wanting a new mental health provider as she does not want to gain therapy and does not wish to change her current psychiatrist. Goal closed Patient Goals/Self-Care Activities: Over the next 120 days Attend scheduled medical appointments Utilize healthy coping skills and supportive resources discussed Contact PCP with any questions or concerns Keep 90 percent of counseling appointments Call your insurance provider for more information about your Enhanced Benefits  Check out counseling resources provided  Begin personal counseling with LCSW, to reduce and manage symptoms of Depression and Stress, until well-established with mental health provider Accept all calls from representative with [facility] in an effort to establish ongoing mental health counseling and supportive services. Incorporate into daily practice - relaxation techniques, deep breathing  exercises, and mindfulness meditation strategies. Talk about feelings with friends, family members, spiritual advisor, etc. Contact LCSW directly [phone]), if you have questions, need assistance, or if additional social work needs are identified between now and our next scheduled telephone outreach call. Call [phone] for mental health hotline/crisis line if needed (24/7 available) Try techniques to reduce symptoms of anxiety/negative thinking (deep breathing, distraction, positive self talk, etc)  - develop a personal safety plan - develop a plan to deal with triggers like holidays, anniversaries - exercise at least 2 to 3 times per week - have a plan for how to handle bad days - journal feelings and what helps to feel better or worse - spend time or talk with others at least 2 to 3 times per week - watch for early signs of feeling worse - begin personal counseling - call and visit an old friend - check out volunteer opportunities - join a support group - laugh; watch a funny movie or comedian - learn and use visualization or guided imagery - perform a random act of kindness - practice relaxation or meditation daily - start or continue a personal journal - practice positive thinking and self-talk -continue with compliance of taking medication  -identify current effective and ineffective coping strategies.  -implement positive self-talk in care to increase self-esteem, confidence and feelings of control.  -consider alternative and complementary therapy approaches such as meditation, mindfulness or yoga.  -journaling, prayer, worship services, meditation or pastoral counseling.  -increase participation in pleasurable group activities such as hobbies, singing, sports or volunteering).  -consider the use of meditative movement therapy such as tai chi, yoga or qigong.  -start a regular daily exercise program based on tolerance, ability and patient choice to support positive thinking and  activity    The following coping skill education was provided for stress relief and mental health management: "When your car dies or a deadline looms, how do you respond? Long-term, low-grade or acute stress takes a serious toll on your body and mind, so don't ignore feelings of constant tension. Stress is a natural part of life. However, too much stress can harm our health, especially if it continues every day. This is chronic stress and can put you at risk for heart problems like heart disease and depression. Understand what's happening inside your body and learn simple coping skills to combat the negative impacts of everyday stressors.  Types of Stress There are two types of stress: Emotional - types of emotional stress are relationship problems, pressure at work, financial worries, experiencing discrimination or having a major life change. Physical - Examples of physical stress include being sick having pain, not sleeping well, recovery from an injury or having an alcohol and drug use disorder. Fight or Flight Sudden or  ongoing stress activates your nervous system and floods your bloodstream with adrenaline and cortisol, two hormones that raise blood pressure, increase heart rate and spike blood sugar. These changes pitch your body into a fight or flight response. That enabled our ancestors to outrun saber-toothed tigers, and it's helpful today for situations like dodging a car accident. But most modern chronic stressors, such as finances or a challenging relationship, keep your body in that heightened state, which hurts your health. Effects of Too Much Stress If constantly under stress, most of [name] will eventually start to function less well.  Multiple studies link chronic stress to a higher risk of heart disease, stroke, depression, weight gain, memory loss and even premature death, so it's important to recognize the warning signals. Talk to your doctor about ways to manage stress if you're  experiencing any of these symptoms: Prolonged periods of poor sleep. Regular, severe headaches. Unexplained weight loss or gain. Feelings of isolation, withdrawal or worthlessness. Constant anger and irritability. Loss of interest in activities. Constant worrying or obsessive thinking. Excessive alcohol or drug use. Inability to concentrate.  10 Ways to Cope with Chronic Stress It's key to recognize stressful situations as they occur because it allows you to focus on managing how you react. We all need to know when to close our eyes and take a deep breath when we feel tension rising. Use these tips to prevent or reduce chronic stress. 1. Rebalance Work and Home All work and no play? If you're spending too much time at the office, intentionally put more dates in your calendar to enjoy time for fun, either alone or with others. 2. Get Regular Exercise Moving your body on a regular basis balances the nervous system and increases blood circulation, helping to flush out stress hormones. Even a daily 20-minute walk makes a difference. Any kind of exercise can lower stress and improve your mood ? just pick activities that you enjoy and make it a regular habit. 3. Eat Well and Limit Alcohol and Stimulants Alcohol, nicotine and caffeine may temporarily relieve stress but have negative health impacts and can make stress worse in the long run. Well-nourished bodies cope better, so start with a good breakfast, add more organic fruits and vegetables for a well-balanced diet, avoid processed foods and sugar, try herbal tea and drink more water. 4. Connect with Supportive People Talking face to face with another person releases hormones that reduce stress. Lean on those good listeners in your life. 5. Burt Time Do you enjoy gardening, reading, listening to music or some other creative pursuit? Engage in activities that bring you pleasure and joy; research shows that reduces stress by almost half  and lowers your heart rate, too. 6. Practice Meditation, Stress Reduction or Yoga Relaxation techniques activate a state of restfulness that counterbalances your body's fight-or-flight hormones. Even if this also means a 10-minute break in a long day: listen to music, read, go for a walk in nature, do a hobby, take a bath or spend time with a friend. Also consider doing a mindfulness exercise or try a daily deep breathing or imagery practice. Deep Breathing Slow, calm and deep breathing can help you relax. Try these steps to focus on your breathing and repeat as needed. Find a comfortable position and close your eyes. Exhale and drop your shoulders. Breathe in through your nose; fill your lungs and then your belly. Think of relaxing your body, quieting your mind and becoming calm and peaceful. Breathe out slowly  through your nose, relaxing your belly. Think of releasing tension, pain, worries or distress. Repeat steps three and four until you feel relaxed. Imagery This involves using your mind to excite the senses -- sound, vision, smell, taste and feeling. This may help ease your stress. Begin by getting comfortable and then do some slow breathing. Imagine a place you love being at. It could be somewhere from your childhood, somewhere you vacationed or just a place in your imagination. Feel how it is to be in the place you're imagining. Pay attention to the sounds, air, colors, and who is there with you. This is a place where you feel cared for and loved. All is well. You are safe. Take in all the smells, sounds, tastes and feelings. As you do, feel your body being nourished and healed. Feel the calm that surrounds you. Breathe in all the good. Breathe out any discomfort or tension. 7. Sleep Enough If you get less than seven to eight hours of sleep, your body won't tolerate stress as well as it could. If stress keeps you up at night, address the cause, and add extra meditation into your day to  make up for the lost z's. Try to get seven to nine hours of sleep each night. Make a regular bedtime schedule. Keep your room dark and cool. Try to avoid computers, TV, cell phones and tablets before bed. 8. Bond with Connections You Enjoy Go out for a coffee with a friend, chat with a neighbor, call a family member, visit with a clergy member, or even hang out with your pet. Clinical studies show that spending even a short time with a companion animal can cut anxiety levels almost in half. 9. Take a Vacation Getting away from it all can reset your stress tolerance by increasing your mental and emotional outlook, which makes you a happier, more productive person upon return. Leave your cellphone and laptop at home! 10. See a Counselor, Coach or Therapist If negative thoughts overwhelm your ability to make positive changes, it's time to seek professional help. Make an appointment today--your health and life are worth it."  If you are experiencing a Mental Health or Behavioral Health Crisis or need someone to talk to, please call the Suicide and Crisis Lifeline: 988         Follow up:  Patient requests no follow-up at this time.  Dickie La, BSW, MSW, [employer] Managed Medicaid LCSW [facility]  [facility] [email or web address] Phone: [phone]

## 2022-04-28 NOTE — Progress Notes (Signed)
Daily Session Note  Patient Details  Name: [name] [name] MRN: [id number] Date of Birth: [birth year] Referring Provider:   Flowsheet Row Pulmonary Rehab from 04/05/2022 in [facility] Cardiac and Pulmonary Rehab  Referring Provider Galen Daft       Encounter Date: 04/28/2022  Check In:  Session Check In - 04/28/22 1622       Check-In   Supervising physician immediately available to respond to emergencies See telemetry face sheet for immediately available ER MD    Location [facility]-Cardiac & Pulmonary Rehab    Staff Present Renita Papa, RN BSN;Joseph Tessie Fass, RCP,RRT,BSRT;Megan Tamala Julian, RN, [place]    Virtual Visit No    Medication changes reported     No    Fall or balance concerns reported    No    Warm-up and Cool-down Performed on first and last piece of equipment    Resistance Training Performed Yes    VAD Patient? No    PAD/SET Patient? No      Pain Assessment   Currently in Pain? No/denies                Social History   Tobacco Use  Smoking Status Former   Packs/day: 2.00   Years: 27.00   Total pack years: 54.00   Types: Cigarettes   Quit date: 07/30/1999   Years since quitting: 22.7  Smokeless Tobacco Never  Tobacco Comments   quit in 2001 2-2.5 a day    Goals Met:  Independence with exercise equipment Exercise tolerated well No report of concerns or symptoms today Strength training completed today  Goals Unmet:  Not Applicable  Comments: Pt able to follow exercise prescription today without complaint.  Will continue to monitor for progression.    Dr. Emily Filbert is Medical Director for [place].  Dr. Ottie Glazier is Medical Director for [facility] Pulmonary Rehabilitation.

## 2022-04-29 ENCOUNTER — Ambulatory Visit: Admit: 2022-04-29 | Discharge: 2022-04-30 | Payer: PRIVATE HEALTH INSURANCE

## 2022-05-03 ENCOUNTER — Other Ambulatory Visit: Payer: Medicaid Other | Admitting: *Deleted

## 2022-05-03 ENCOUNTER — Encounter: Payer: Self-pay | Admitting: *Deleted

## 2022-05-03 NOTE — Patient Outreach (Signed)
Medicaid Managed Care   Nurse Care Manager Note  05/03/2022 Name:  [name] [name] MRN:  [id number] DOB:  Oct 27, 1960  [name] [name] is an 62 y.o. year old female who is [name] primary patient of [name] Lamb, NP.  The [facility] Managed Care Coordination team was consulted for assistance with:    COPD  [name] [name] was given information about Medicaid Managed Care Coordination team services today. [name] [name] Patient agreed to services and verbal consent obtained.  Engaged with patient by telephone for follow up visit in response to provider referral for case management and/or care coordination services.   Assessments/Interventions:  Review of past medical history, allergies, medications, health status, including review of consultants reports, laboratory and other test data, was performed as part of comprehensive evaluation and provision of chronic care management services.  SDOH (Social Determinants of Health) assessments and interventions performed: SDOH Interventions    Flowsheet Row Patient Outreach Telephone from 05/03/2022 in [place] Patient Outreach Telephone from 04/28/2022 in [place] Pulmonary Rehab from 04/05/2022 in [facility] Cardiac and Pulmonary Rehab Patient Outreach Telephone from 03/26/2022 in [place] Patient Outreach Telephone from 03/19/2022 in [place] Patient Outreach Telephone from 03/17/2022 in [place]  SDOH Interventions        Housing Interventions -- -- -- -- Intervention Not Indicated Intervention Not Indicated  Transportation Interventions Intervention Not Indicated -- -- -- -- Intervention Not Indicated  Depression Interventions/Treatment  -- -- Medication -- Referral to Psychiatry Currently on Treatment  [Referral to LCSW]  Stress Interventions -- [name] [name], Provide Counseling -- [name] [name], Provide Counseling -- --       Care Plan  Allergies  Allergen Reactions   Cephalexin Hives   Codeine Palpitations, Nausea Only, Nausea And Vomiting, Rash and Shortness Of Breath    "makes heart fly, she gets flushed and passes out"   Doxycycline Rash   Propoxyphene Rash and Shortness Of Breath    Increase heart rate   Sulfa Antibiotics Palpitations, Nausea Only, Shortness Of Breath and Hives    "makes heart fly, she gets flushed and passes out"   Clindamycin Rash    Tongue swelling, oral sores, and Mouth rash   Lovenox [Enoxaparin Sodium] Hives   Hydrocodone Nausea And Vomiting    Hear racing & breaks out into [name] cold sweat.   Meropenem Rash    Erythematous, hot, pruritic rash over arms, chest, back, abdomen, and face occurred at the end of meropenem infusion on 02/22/18    Medications Reviewed Today     Reviewed by [name] Montane, RN (Registered Nurse) on 05/03/22 at 1345  Med List Status: <None>   Medication Order Taking? Sig Documenting Provider Last Dose Status Informant  acetaminophen (TYLENOL) 325 MG tablet [id number] Yes Take 2 tablets (650 mg total) by mouth every 6 (six) hours as needed for mild pain or moderate pain (or Fever >/= 101). [name] [name] [place], [place] Taking Active Self  amLODipine (NORVASC) 10 MG tablet [id number] Yes Take 10 mg by mouth daily. [provider] Taking Active Self  ANORO ELLIPTA 62.5-25 MCG/ACT AEPB [id number]  1 puff daily.  Patient not taking: Reported on 03/01/2022   [provider]  Active Self  ARIPiprazole (ABILIFY) 5 MG tablet [id number] Yes Take 5 mg by mouth daily. [provider] Taking Active Self  ascorbic acid (VITAMIN C) 500 MG tablet [id number]  Yes Take 1 tablet (500 mg total) by mouth daily. [name] Hamman, MD Taking Active Self  atorvastatin (LIPITOR) 80 MG tablet [id number] Yes Take 80 mg by mouth daily. [provider] Taking Active Self   Budeson-Glycopyrrol-Formoterol (BREZTRI AEROSPHERE) 160-9-4.8 MCG/ACT AERO [id number] Yes Inhale 2 puffs into the lungs in the morning and at bedtime. [name] Pita, MD Taking Active Self  busPIRone (BUSPAR) 10 MG tablet [id number] Yes Take 1 tablet (10 mg total) by mouth 2 (two) times daily. Clapacs, Madie [place], MD Taking Active Self  BUTRANS 7.5 MCG/HR [id number] Yes Place 1 patch onto the skin once [name] week. [provider] Taking Active   Calcium Citrate-Vitamin D3 (GNP CALCIUM CITRATE+D MAXIMUM) 315-6.25 MG-MCG TABS [id number] Yes Take 1 tablet by mouth daily. [provider] Taking Active Self  Cholecalciferol (VITAMIN D) 125 MCG (5000 UT) CAPS [id number] Yes Take 1 capsule by mouth daily. [provider] Taking Active Self  ELIQUIS 5 MG TABS tablet [id number] Yes TAKE 1 TABLET BY MOUTH TWICE DAILY  Patient taking differently: Take 5 mg by mouth 2 (two) times daily.   [name] Pita, MD Taking Active Self  famotidine (PEPCID) 20 MG tablet [id number] Yes Take 1 tablet (20 mg total) by mouth daily. [name] Hamman, MD Taking Active Self  FLUoxetine (PROZAC) 40 MG capsule [id number] Yes Take 1 capsule (40 mg total) by mouth daily. Clapacs, Madie Reno, MD Taking Active Self  folic acid (FOLVITE) 1 MG tablet [id number] Yes Take 1 mg by mouth daily.  [provider] Taking Active Self  hydroxychloroquine (PLAQUENIL) 200 MG tablet [id number] Yes Take 200 mg by mouth 2 (two) times daily. [provider] Taking Active Self  levothyroxine (SYNTHROID, LEVOTHROID) 88 MCG tablet [id number] Yes Take 88 mcg by mouth daily before breakfast. [provider] Taking Active Self  metFORMIN (GLUCOPHAGE-XR) 500 MG 24 hr tablet [id number] Yes Take 500 mg by mouth daily. [provider] Taking Active Self  methotrexate (RHEUMATREX) 2.5 MG tablet [id number] Yes Take 2.5 mg by mouth once [name] week. [provider] Taking Active   metoCLOPramide (REGLAN) 5 MG/5ML  solution [id number] Yes Take 5 mg by mouth 4 (four) times daily -  before meals and at bedtime. [provider] Taking Active Self           Med Note ([name] [name]   Wed Mar 17, 2022  9:19 AM) Taking 10 mg 4 times daily  metoprolol tartrate (LOPRESSOR) 25 MG tablet [id number] Yes Take 25 mg by mouth 2 (two) times daily. [provider] Taking Active Self  oxyCODONE-acetaminophen (PERCOCET/ROXICET) 5-325 MG tablet [id number] No Take 1 tablet by mouth every 8 (eight) hours as needed for severe pain.  Patient not taking: Reported on 03/31/2022   [provider] Not Taking Active Self  pramipexole (MIRAPEX) 0.5 MG tablet [id number] Yes Take 0.5 mg by mouth at bedtime. [provider] Taking Active   pregabalin (LYRICA) 100 MG capsule [id number] Yes Take 1 capsule (100 mg total) by mouth 3 (three) times daily. Gillis Santa, MD Taking Active Self  promethazine (PHENERGAN) 12.5 MG tablet [id number] Yes Take 12.5 mg by mouth. [provider] Taking Active Self  QUEtiapine (SEROQUEL) 300 MG tablet [id number] Yes TAKE ONE TABLET BY MOUTH AT BEDTIME. Clapacs, Madie Reno, MD Taking Active Self           Med Note Thamas Jaegers, Darianny Momon [name]   Mon May 03, 2022  1:39 PM)    TRADJENTA 5 MG  TABS tablet [id number] Yes Take 5 mg by mouth daily. [provider] Taking Active Self  zinc sulfate 220 (50 Zn) MG capsule [id number] Yes Take 220 mg by mouth at bedtime.  [provider] Taking Active Self  zolpidem (AMBIEN) 5 MG tablet [id number] No Take 1 tablet (5 mg total) by mouth at bedtime.  Patient not taking: Reported on 05/03/2022   Clapacs, Jackquline Denmark, MD Not Taking Active Self           Med Note (Lashan Macias [name]   Mon May 03, 2022  1:40 PM) Ran out            Patient Active Problem List   Diagnosis Date Noted   Gastroparesis 02/28/2022   Allergy to multiple antibiotics 02/28/2022   Lactic acidosis 01/05/2022   Sepsis (HCC) 01/04/2022   Acute cystitis 12/27/2021    Morbid obesity (HCC)    Depression with anxiety    PAF (paroxysmal atrial fibrillation) (HCC)    Chronic respiratory failure with hypoxia (HCC) 02/07/2020   Paroxysmal [name]-fib (HCC)    Morbid obesity with BMI of 50.0-59.9, adult (HCC) 07/08/2019   Controlled type 2 diabetes mellitus without complication, without long-term current use of insulin (HCC) 04/06/2019   CKD (chronic kidney disease), stage III (HCC) 04/06/2019   HLD (hyperlipidemia) 04/06/2019   AKI (acute kidney injury) (HCC) 02/12/2019   Diabetic ulcer of left foot (HCC) 02/14/2018   Chronic pain syndrome 03/31/2016   Rheumatoid arthritis (HCC) 12/25/2015   Osteoarthritis, multiple sites 12/24/2015   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[employer]) 12/23/2015   Long term current use of opiate analgesic 12/23/2015   Long term prescription opiate use 12/23/2015   Diabetic peripheral neuropathy (HCC) 12/23/2015   Chronic diastolic CHF (congestive heart failure) (HCC) 07/03/2014   COPD (chronic obstructive pulmonary disease) (HCC) 01/03/2014   Bipolar disorder, unspecified (HCC) 01/03/2014   Borderline personality disorder (HCC) 01/06/2012   Hypothyroidism 06/28/2010   Rheumatoid arthritis involving multiple sites with positive rheumatoid factor (HCC) 06/28/2010   Essential (primary) hypertension 01/27/2005   Major depressive disorder, recurrent episode, moderate (HCC) 06/03/2004    Conditions to be addressed/monitored per PCP order:  COPD  Care Plan : RN Care Manager Plan of Care  Updates made by Heidi Dach, RN since 05/03/2022 12:00 AM     Problem: Health Management needs related to COPD and Mental Health      Long-Range Goal: Development of Plan of Care To address Health Management needs related to COPD and Mental Health   Start Date: 03/17/2022  Expected End Date: 06/15/2022  Priority: High  Note:   Current Barriers:  Chronic Disease Management support and education needs related to COPD and  Mental Health  RNCM Clinical Goal(s):  Patient will verbalize understanding of plan for management of COPD and Mental Health as evidenced by patient reports take all medications exactly as prescribed and will call provider for medication related questions as evidenced by patient reports and EMR documentation    attend all scheduled medical appointments: 05/20/22 with Heart and Vascular, 05/28/22 with PCP, 06/25/22 with Rheumatology and 06/29/22 with Endocrinology as evidenced by Provider documentation        continue to work with RN Care Manager and/or Social Worker to address care management and care coordination needs related to COPD and Mental Health as evidenced by adherence to CM Team Scheduled appointments     through collaboration with RN Care manager, provider, and care team.   Interventions: Inter-disciplinary care team collaboration (  see longitudinal plan of care) Evaluation of current treatment plan related to  self management and patient's adherence to plan as established by provider Advised patient to schedule follow up with Dr. Clapacs(Psychiatry) and Dr. Lateef(Pain Management)  COPD: (Status: Goal on Track (progressing): YES.) Long Term Goal  Reviewed medications with patient, including use of prescribed maintenance and rescue inhalers, and provided instruction on medication management and the importance of adherence Provided patient with basic written and verbal COPD education on self care/management/and exacerbation prevention Provided instruction about proper use of medications used for management of COPD including inhalers Advised patient to self assesses COPD action plan zone and make appointment with provider if in the yellow zone for 48 hours without improvement Discussed the importance of adequate rest and management of fatigue with COPD Screening for signs and symptoms of depression related to chronic disease state  Assessed social determinant of health barriers    Mental  Health  (Status:  Goal on track:  Yes.)  Long Term Goal-Patient working with [name], LCSW Evaluation of current treatment plan related to  Mental Health ,  self-management and patient's adherence to plan as established by provider. Discussed plans with patient for ongoing care management follow up and provided patient with direct contact information for care management team Reviewed medications with patient and discussed Ambien is on medication list, but has not been dispensed since 10/2021, patient not sleeping at night Screening for signs and symptoms of depression related to chronic disease state  Assessed social determinant of health barriers Advised patient to schedule follow up with Psychiatry, discuss needing ambien   Patient Goals/Self-Care Activities: Take medications as prescribed   Attend all scheduled provider appointments Call provider office for new concerns or questions  Work with the social worker to address care coordination needs and will continue to work with the clinical team to address health care and disease management related needs call 1-800-273-TALK (toll free, 24 hour hotline) go to [facility] [address] [place] [phone]) call 911 if experiencing [name] Mental Health or Behavioral Health Crisis  avoid second hand smoke limit outdoor activity during cold weather develop [name] new routine to improve sleep practice relaxation or meditation daily Discuss pulmonary rehab with provider Cut back on sugary drinks       Follow Up:  Patient agrees to Care Plan and Follow-up.  Plan: The Managed Medicaid care management team will reach out to the patient again over the next 30 days.  Date/time of next scheduled RN care management/care coordination outreach:  06/03/22 @ 2:30pm  Estanislado Emms RN, BSN [place]  [facility] RN Care Coordinator

## 2022-05-03 NOTE — Patient Instructions (Signed)
Visit Information  [name] [name] was given information about Medicaid Managed Care team care coordination services as a part of their [place] Complete Medicaid benefit. [name] [name] verbally consented to engagement with the [facility] Managed Care team.   If you are experiencing a medical emergency, please call 911 or report to your local emergency department or urgent care.   If you have a non-emergency medical problem during routine business hours, please contact your provider's office and ask to speak with a nurse.   For questions related to your [place] Complete Medicaid health plan, please call: [phone]  If you would like to schedule transportation through your [place] Complete Medicaid plan, please call the following number at least 2 days in advance of your appointment: [phone].   There is no limit to the number of trips during the year between medical appointments, healthcare facilities, or pharmacies. Transportation must be scheduled at least 2 business days before but not more than thirty 30 days before of your appointment.  Call the [place] at [phone], at any time, 24 hours a day, 7 days a week. If you are in danger or need immediate medical attention call 911.  If you would like help to quit smoking, call 1-800-QUIT-NOW (612)477-1664) OR Espaol: 1-855-Djelo-Ya (7-322-025-4270) o para ms informacin haga clic aqu or Text READY to 200-400 to register via text  [name] [name] - following are the goals we discussed in your visit today:   Please see education materials related to COPD provided by MyChart link.  Patient verbalizes understanding of instructions and care plan provided today and agrees to view in [place]. Active MyChart status and patient understanding of how to access instructions and care plan via MyChart confirmed with patient.     Telephone follow up appointment with Managed Medicaid care management team member scheduled  for:06/03/22 @ 2:30pm  Lurena Joiner RN, BSN [place] RN Care Coordinator   Following is a copy of your plan of care:  Care Plan : RN Care Manager Plan of Care  Updates made by [name] Montane, RN since 05/03/2022 12:00 AM     Problem: Health Management needs related to COPD and Mental Health      Long-Range Goal: Development of Plan of Care To address Health Management needs related to COPD and Mental Health   Start Date: 03/17/2022  Expected End Date: 06/15/2022  Priority: High  Note:   Current Barriers:  Chronic Disease Management support and education needs related to COPD and Mental Health  RNCM Clinical Goal(s):  Patient will verbalize understanding of plan for management of COPD and Mental Health as evidenced by patient reports take all medications exactly as prescribed and will call provider for medication related questions as evidenced by patient reports and EMR documentation    attend all scheduled medical appointments: 05/20/22 with Heart and Vascular, 05/28/22 with PCP, 06/25/22 with Rheumatology and 06/29/22 with Endocrinology as evidenced by Provider documentation        continue to work with [place] and/or Social Worker to address care management and care coordination needs related to COPD and Mental Health as evidenced by adherence to CM Team Scheduled appointments     through collaboration with RN Care manager, provider, and care team.   Interventions: Inter-disciplinary care team collaboration (see longitudinal plan of care) Evaluation of current treatment plan related to  self management and patient's adherence to plan as established by provider Advised patient to schedule follow up with  Dr. Clapacs(Psychiatry) and Dr. Lateef(Pain Management)  COPD: (Status: Goal on Track (progressing): YES.) Long Term Goal  Reviewed medications with patient, including use of prescribed maintenance and rescue inhalers, and provided instruction on  medication management and the importance of adherence Provided patient with basic written and verbal COPD education on self care/management/and exacerbation prevention Provided instruction about proper use of medications used for management of COPD including inhalers Advised patient to self assesses COPD action plan zone and make appointment with provider if in the yellow zone for 48 hours without improvement Discussed the importance of adequate rest and management of fatigue with COPD Screening for signs and symptoms of depression related to chronic disease state  Assessed social determinant of health barriers    Mental Health  (Status:  Goal on track:  Yes.)  Long Term Goal-Patient working with Jerene Pitch, LCSW Evaluation of current treatment plan related to  Mental Health ,  self-management and patient's adherence to plan as established by provider. Discussed plans with patient for ongoing care management follow up and provided patient with direct contact information for care management team Reviewed medications with patient and discussed Ambien is on medication list, but has not been dispensed since 10/2021, patient not sleeping at night Screening for signs and symptoms of depression related to chronic disease state  Assessed social determinant of health barriers Advised patient to schedule follow up with Psychiatry, discuss needing ambien   Patient Goals/Self-Care Activities: Take medications as prescribed   Attend all scheduled provider appointments Call provider office for new concerns or questions  Work with the social worker to address care coordination needs and will continue to work with the clinical team to address health care and disease management related needs call 1-800-273-TALK (toll free, 24 hour hotline) go to [facility] [address], [place] [phone]) call 911 if experiencing a Mental Health or [place]  avoid  second hand smoke limit outdoor activity during cold weather develop a new routine to improve sleep practice relaxation or meditation daily Discuss pulmonary rehab with provider Cut back on sugary drinks

## 2022-05-04 ENCOUNTER — Telehealth: Payer: Medicaid Other | Admitting: Psychiatry

## 2022-05-04 DIAGNOSIS — G4700 Insomnia, unspecified: Secondary | ICD-10-CM

## 2022-05-04 DIAGNOSIS — F331 Major depressive disorder, recurrent, moderate: Secondary | ICD-10-CM

## 2022-05-04 DIAGNOSIS — Z79899 Other long term (current) drug therapy: Secondary | ICD-10-CM

## 2022-05-04 DIAGNOSIS — F411 Generalized anxiety disorder: Secondary | ICD-10-CM

## 2022-05-04 MED ORDER — FLUOXETINE HCL 40 MG PO CAPS: 40.0000 mg | ORAL_CAPSULE | Freq: Every day | ORAL | 5 refills | Status: DC

## 2022-05-04 MED ORDER — QUETIAPINE FUMARATE 300 MG PO TABS: 300.0000 mg | ORAL_TABLET | Freq: Every day | ORAL | 5 refills | Status: DC

## 2022-05-04 MED ORDER — BUSPIRONE HCL 10 MG PO TABS: 10.0000 mg | ORAL_TABLET | Freq: Two times a day (BID) | ORAL | 5 refills | Status: DC

## 2022-05-04 MED ORDER — ZOLPIDEM TARTRATE 5 MG PO TABS: 5.0000 mg | ORAL_TABLET | Freq: Every day | ORAL | 5 refills | Status: DC

## 2022-05-04 MED ORDER — ARIPIPRAZOLE 5 MG PO TABS: 5.0000 mg | ORAL_TABLET | Freq: Every day | ORAL | 5 refills | Status: DC

## 2022-05-04 NOTE — Progress Notes (Signed)
Virtual Visit via Telephone Note  I connected with [name] [name] on 05/04/22 at  1:40 PM EST by telephone and verified that I am speaking with the correct person using two identifiers.  Location: Patient: Home Provider: Hospital   I discussed the limitations, risks, security and privacy concerns of performing an evaluation and management service by telephone and the availability of in person appointments. I also discussed with the patient that there may be a patient responsible charge related to this service. The patient expressed understanding and agreed to proceed.   History of Present Illness: Patient by telephone for follow-up.  No new complaints.  Reports that mood has generally been good.  Trouble sleeping the last month because she ran out of Ambien.  No psychotic symptoms no suicidality.  Overall feels health is pretty stable given her multiple chronic medical issues    Observations/Objective: Alert and oriented.  No disorganized thinking no suicidal thinking no psychosis  Assessment and Plan: Renewed all medicines and reviewed medications discussing side effects and risks and benefits.  Everything refilled follow-up 6 months   Follow Up Instructions:    I discussed the assessment and treatment plan with the patient. The patient was provided an opportunity to ask questions and all were answered. The patient agreed with the plan and demonstrated an understanding of the instructions.   The patient was advised to call back or seek an in-person evaluation if the symptoms worsen or if the condition fails to improve as anticipated.  I provided 20 minutes of non-face-to-face time during this encounter.  I reviewed history of presenting illness, treatment plan.  This is a patient of Dr. Toni Amend who is not working with [name] anymore.  I will sign the chart to close to encounter as a chief of psychiatry and Wellsite geologist.  No changes made to the progress note.   Mordecai Rasmussen, MD

## 2022-05-05 ENCOUNTER — Encounter: Payer: Medicaid Other | Admitting: *Deleted

## 2022-05-05 DIAGNOSIS — J8410 Pulmonary fibrosis, unspecified: Secondary | ICD-10-CM

## 2022-05-05 NOTE — Progress Notes (Signed)
Daily Session Note  Patient Details  Name: ISAAC LACSON MRN: [id number] Date of Birth: October 23, 1960 Referring Provider:   Flowsheet Row Pulmonary Rehab from 04/05/2022 in [facility]  Referring Provider Galen Daft       Encounter Date: 05/05/2022  Check In:  Session Check In - 05/05/22 1602       Check-In   Supervising physician immediately available to respond to emergencies See telemetry face sheet for immediately available ER MD    Location [facility]-Cardiac & Pulmonary Rehab    Staff Present Renita Papa, RN BSN;Joseph [place], RCP,RRT,BSRT;Kelly [place], [place], ACSM CEP, Exercise Physiologist    Virtual Visit No    Medication changes reported     No    Fall or balance concerns reported    No    Warm-up and Cool-down Performed on first and last piece of equipment    Resistance Training Performed Yes    VAD Patient? No    PAD/SET Patient? No      Pain Assessment   Currently in Pain? No/denies                Social History   Tobacco Use  Smoking Status Former   Packs/day: 2.00   Years: 27.00   Total pack years: 54.00   Types: Cigarettes   Quit date: 07/30/1999   Years since quitting: 22.7  Smokeless Tobacco Never  Tobacco Comments   quit in 2001 2-2.5 a day    Goals Met:  Independence with exercise equipment Exercise tolerated well No report of concerns or symptoms today Strength training completed today  Goals Unmet:  Not Applicable  Comments: Pt able to follow exercise prescription today without complaint.  Will continue to monitor for progression.    Dr. Emily Filbert is Medical Director for [facility].  Dr. Ottie Glazier is Medical Director for [facility].

## 2022-05-10 ENCOUNTER — Other Ambulatory Visit: Payer: Medicaid Other

## 2022-05-10 ENCOUNTER — Encounter: Payer: Medicaid Other | Admitting: *Deleted

## 2022-05-10 DIAGNOSIS — J8410 Pulmonary fibrosis, unspecified: Secondary | ICD-10-CM

## 2022-05-10 MED ORDER — FAMOTIDINE 20 MG TABLET
ORAL_TABLET | Freq: Every day | ORAL | 1 refills | 90 days | Status: CP
Start: 2022-05-10 — End: 2023-05-10

## 2022-05-10 MED ORDER — CHOLECALCIFEROL (VITAMIN D3) 125 MCG (5,000 UNIT) CAPSULE
ORAL_CAPSULE | Freq: Every day | ORAL | 3 refills | 90 days | Status: CP
Start: 2022-05-10 — End: 2023-05-10

## 2022-05-10 MED ORDER — METOPROLOL TARTRATE 25 MG TABLET
ORAL_TABLET | Freq: Two times a day (BID) | ORAL | 1 refills | 90 days | Status: CP
Start: 2022-05-10 — End: 2023-05-10

## 2022-05-10 NOTE — Patient Instructions (Signed)
Visit Information  [name] [name] was given information about Medicaid Managed Care team care coordination services as a part of their [place] Medicaid benefit. [name] [name] verbally consented to engagement with the [facility] Managed Care team.   If you are experiencing a medical emergency, please call 911 or report to your local emergency department or urgent care.   If you have a non-emergency medical problem during routine business hours, please contact your provider's office and ask to speak with a nurse.   For questions related to your [place] health plan, please call: [phone]  If you would like to schedule transportation through your [place] plan, please call the following number at least 2 days in advance of your appointment: [phone].   There is no limit to the number of trips during the year between medical appointments, healthcare facilities, or pharmacies. Transportation must be scheduled at least 2 business days before but not more than thirty 30 days before of your appointment.  Call the Behavioral Health Crisis Line at [phone], at any time, 24 hours a day, 7 days a week. If you are in danger or need immediate medical attention call 911.  If you would like help to quit smoking, call 1-800-QUIT-NOW (310-845-5676) OR Espaol: 1-855-Djelo-Ya (1-497-026-3785) o para ms informacin haga clic aqu or Text READY to 885-027 to register via text  [name] [name] - following are the goals we discussed in your visit today:     Social Worker will follow up in 6 days.   Gus Puma, BSW, [facility]  [place]  High Risk Managed Medicaid Team  [phone]   Following is a copy of your plan of care:  Care Plan : LCSW Plan of Care  Updates made by Shaune Leeks since 05/10/2022 12:00 AM     Problem: Depression Identification (Depression)      Long-Range Goal: Depressive Symptoms Identified    Start Date: 03/19/2022  Priority: High  Note:   Priority: High  Timeframe:  Long-Range Goal Priority:  High Start Date:   03/19/22           Expected End Date:  ongoing                     Follow Up Date--04/15/22 at 1 pm  - keep 90 percent of scheduled appointments -consider counseling or psychiatry -consider bumping up your self-care  -consider creating a stronger support network   Why is this important?             Beating depression may take some time.            If you don't feel better right away, don't give up on your treatment plan.    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 Patient requires Support, Education, Resources, Referrals, Advocacy, and Care Coordination, in order to meet Unmet Mental Health Needs. Patient will implement clinical interventions discussed today to decrease symptoms and increase knowledge and/or ability of: coping skills. Mental Health Concerns and Social Isolation Patient lacks knowledge of available community counseling agencies and resources.  Clinical Goal(s): verbalize understanding of plan for management of Anxiety, Depression, and Stress and demonstrate a reduction in symptoms. Patient will connect with a provider for ongoing mental health treatment, increase coping skills, healthy habits, self-management skills, and stress reduction        Clinical Interventions:  Assessed patient's previous and current treatment,  coping skills, support system and barriers to care. Patient provided hx  Verbalization of feelings encouraged, motivational interviewing employed Emotional support provided, positive coping strategies explored. Establishing healthy boundaries emphasized and healthy self-care education provided Patient was educated on available mental health resources within their area that accept Medicaid and offer counseling and psychiatry. Patient reports that she is on psychotropics already but  needs additional mental health follow up due to added stressors.  Patient was successful in identifying triggers to depression symptoms, in addition, to healthy coping skills. Patient reports significant worsening mood impacting her ability to function appropriately and carry out daily task. Patient is agreeable to referral to [facility] for counseling and psychiatry. [facility] LCSW made referral on 03/19/22. LCSW provided education on relaxation techniques such as meditation, deep breathing, massage, grounding exercises or yoga that can activate the body's relaxation response and ease symptoms of stress and anxiety. LCSW ask that when pt is struggling with difficult emotions and racing thoughts that they start this relaxation response process. LCSW provided extensive education on healthy coping skills for anxiety. SW used active and reflective listening, validated patient's feelings/concerns, and provided emotional support. Patient will work on implementing appropriate self-care habits into their daily routine such as: staying positive, writing a gratitude list, drinking water, staying active around the house, taking their medications as prescribed, combating negative thoughts or emotions and staying connected with their family and friends. Positive reinforcement provided for this decision to work on this. LCSW provided education on healthy sleep hygiene and what that looks like. LCSW encouraged patient to implement a night time routine into their schedule that works best for them and that they are able to maintain. Advised patient to implement deep breathing/grounding/meditation/self-care exercises into their nightly routine to combat racing thoughts at night. LCSW encouraged patient to wake up at the same time each day, make their sleeping environment comfortable, exercise when able, to limit naps and to not eat or drink anything right before bed.  Motivational Interviewing employed Depression screen reviewed  PHQ2/  PHQ9 completed or reviewed  Mindfulness or Relaxation training provided Active listening / Reflection utilized  Advance Care and HCPOA education provided Emotional Support Provided Problem [name] [name] strategies reviewed Provided psychoeducation for mental health needs  Provided brief CBT  Reviewed mental health medications and discussed importance of compliance:  Quality of sleep assessed & Sleep Hygiene techniques promoted  Participation in counseling encouraged  Verbalization of feelings encouraged  Suicidal Ideation/Homicidal Ideation assessed: Patient denies SI/HI  Review resources, discussed options and provided patient information about  [place] care team collaboration (see longitudinal plan of care) 03/26/22 update- Patient and [facility] LCSW completed joint call to [facility] and was informed that patient will need an additional referral made in order to get both counseling and psychiatry. Referral adjustment made. Per [facility], referral has to be reviewed first before scheduling so patient is unable to schedule her intake appointment just yet. Email sent to patient with resource contact information. [facility] staff stated that they are booked up until February of 2024. UPDATE- Patient report no longer wanting a new mental health provider as she does not want to gain therapy and does not wish to change her current psychiatrist. Goal closed Patient Goals/Self-Care Activities: Over the next 120 days Attend scheduled medical appointments Utilize healthy coping skills and supportive resources discussed Contact PCP with any questions or concerns Keep 90 percent of counseling appointments Call your insurance provider for more information about your Enhanced Benefits  Check out  counseling resources provided  Begin personal counseling with LCSW, to reduce and manage symptoms of Depression and Stress, until well-established with mental health provider Accept all calls  from representative with [facility] in an effort to establish ongoing mental health counseling and supportive services. Incorporate into daily practice - relaxation techniques, deep breathing exercises, and mindfulness meditation strategies. Talk about feelings with friends, family members, spiritual advisor, etc. Contact LCSW directly [phone]), if you have questions, need assistance, or if additional social work needs are identified between now and our next scheduled telephone outreach call. Call [phone] for mental health hotline/crisis line if needed (24/7 available) Try techniques to reduce symptoms of anxiety/negative thinking (deep breathing, distraction, positive self talk, etc)  - develop a personal safety plan - develop a plan to deal with triggers like holidays, anniversaries - exercise at least 2 to 3 times per week - have a plan for how to handle bad days - journal feelings and what helps to feel better or worse - spend time or talk with others at least 2 to 3 times per week - watch for early signs of feeling worse - begin personal counseling - call and visit an old friend - check out volunteer opportunities - join a support group - laugh; watch a funny movie or comedian - learn and use visualization or guided imagery - perform a random act of kindness - practice relaxation or meditation daily - start or continue a personal journal - practice positive thinking and self-talk -continue with compliance of taking medication  -identify current effective and ineffective coping strategies.  -implement positive self-talk in care to increase self-esteem, confidence and feelings of control.  -consider alternative and complementary therapy approaches such as meditation, mindfulness or yoga.  -journaling, prayer, worship services, meditation or pastoral counseling.  -increase participation in pleasurable group activities such as hobbies, singing, sports or volunteering).  -consider the use  of meditative movement therapy such as tai chi, yoga or qigong.  -start a regular daily exercise program based on tolerance, ability and patient choice to support positive thinking and activity    The following coping skill education was provided for stress relief and mental health management: "When your car dies or a deadline looms, how do you respond? Long-term, low-grade or acute stress takes a serious toll on your body and mind, so don't ignore feelings of constant tension. Stress is a natural part of life. However, too much stress can harm our health, especially if it continues every day. This is chronic stress and can put you at risk for heart problems like heart disease and depression. Understand what's happening inside your body and learn simple coping skills to combat the negative impacts of everyday stressors.  Types of Stress There are two types of stress: Emotional - types of emotional stress are relationship problems, pressure at work, financial worries, experiencing discrimination or having a major life change. Physical - Examples of physical stress include being sick having pain, not sleeping well, recovery from an injury or having an alcohol and drug use disorder. Fight or Flight Sudden or ongoing stress activates your nervous system and floods your bloodstream with adrenaline and cortisol, two hormones that raise blood pressure, increase heart rate and spike blood sugar. These changes pitch your body into a fight or flight response. That enabled our ancestors to outrun saber-toothed tigers, and it's helpful today for situations like dodging a car accident. But most modern chronic stressors, such as finances or a challenging relationship, keep your body in  that heightened state, which hurts your health. Effects of Too Much Stress If constantly under stress, most of [name] will eventually start to function less well.  Multiple studies link chronic stress to a higher risk of heart disease,  stroke, depression, weight gain, memory loss and even premature death, so it's important to recognize the warning signals. Talk to your doctor about ways to manage stress if you're experiencing any of these symptoms: Prolonged periods of poor sleep. Regular, severe headaches. Unexplained weight loss or gain. Feelings of isolation, withdrawal or worthlessness. Constant anger and irritability. Loss of interest in activities. Constant worrying or obsessive thinking. Excessive alcohol or drug use. Inability to concentrate.  10 Ways to Cope with Chronic Stress It's key to recognize stressful situations as they occur because it allows you to focus on managing how you react. We all need to know when to close our eyes and take a deep breath when we feel tension rising. Use these tips to prevent or reduce chronic stress. 1. Rebalance Work and Home All work and no play? If you're spending too much time at the office, intentionally put more dates in your calendar to enjoy time for fun, either alone or with others. 2. Get Regular Exercise Moving your body on a regular basis balances the nervous system and increases blood circulation, helping to flush out stress hormones. Even a daily 20-minute walk makes a difference. Any kind of exercise can lower stress and improve your mood ? just pick activities that you enjoy and make it a regular habit. 3. Eat Well and Limit Alcohol and Stimulants Alcohol, nicotine and caffeine may temporarily relieve stress but have negative health impacts and can make stress worse in the long run. Well-nourished bodies cope better, so start with a good breakfast, add more organic fruits and vegetables for a well-balanced diet, avoid processed foods and sugar, try herbal tea and drink more water. 4. Connect with Supportive People Talking face to face with another person releases hormones that reduce stress. Lean on those good listeners in your life. 5. Carve Out Hobby Time Do you  enjoy gardening, reading, listening to music or some other creative pursuit? Engage in activities that bring you pleasure and joy; research shows that reduces stress by almost half and lowers your heart rate, too. 6. Practice Meditation, Stress Reduction or Yoga Relaxation techniques activate a state of restfulness that counterbalances your body's fight-or-flight hormones. Even if this also means a 10-minute break in a long day: listen to music, read, go for a walk in nature, do a hobby, take a bath or spend time with a friend. Also consider doing a mindfulness exercise or try a daily deep breathing or imagery practice. Deep Breathing Slow, calm and deep breathing can help you relax. Try these steps to focus on your breathing and repeat as needed. Find a comfortable position and close your eyes. Exhale and drop your shoulders. Breathe in through your nose; fill your lungs and then your belly. Think of relaxing your body, quieting your mind and becoming calm and peaceful. Breathe out slowly through your nose, relaxing your belly. Think of releasing tension, pain, worries or distress. Repeat steps three and four until you feel relaxed. Imagery This involves using your mind to excite the senses -- sound, vision, smell, taste and feeling. This may help ease your stress. Begin by getting comfortable and then do some slow breathing. Imagine a place you love being at. It could be somewhere from your childhood, somewhere you vacationed  or just a place in your imagination. Feel how it is to be in the place you're imagining. Pay attention to the sounds, air, colors, and who is there with you. This is a place where you feel cared for and loved. All is well. You are safe. Take in all the smells, sounds, tastes and feelings. As you do, feel your body being nourished and healed. Feel the calm that surrounds you. Breathe in all the good. Breathe out any discomfort or tension. 7. Sleep Enough If you get less than  seven to eight hours of sleep, your body won't tolerate stress as well as it could. If stress keeps you up at night, address the cause, and add extra meditation into your day to make up for the lost z's. Try to get seven to nine hours of sleep each night. Make a regular bedtime schedule. Keep your room dark and cool. Try to avoid computers, TV, cell phones and tablets before bed. 8. Bond with Connections You Enjoy Go out for a coffee with a friend, chat with a neighbor, call a family member, visit with a clergy member, or even hang out with your pet. Clinical studies show that spending even a short time with a companion animal can cut anxiety levels almost in half. 9. Take a Vacation Getting away from it all can reset your stress tolerance by increasing your mental and emotional outlook, which makes you a happier, more productive person upon return. Leave your cellphone and laptop at home! 10. See a Counselor, Coach or Therapist If negative thoughts overwhelm your ability to make positive changes, it's time to seek professional help. Make an appointment today--your health and life are worth it."  If you are experiencing a Mental Health or [place] or need someone to talk to, please call the Suicide and Crisis Lifeline: [place] completed a telephone outreach with patient. She stated everything is going well. No resources are needed at this time.

## 2022-05-10 NOTE — Patient Outreach (Signed)
Medicaid Managed Care Social Work Note  05/10/2022 Name:  [name] [name] MRN:  [id number] DOB:  May 05, 1960  [name] [name] is an 62 y.o. year old female who is a primary [name] [name] of Etheleen Nicks, NP.  The Medicaid Managed Care Coordination team was consulted for assistance with:  Community Resources   [name] [name] was given information about Medicaid Managed Care Coordination team services today. [name] [name] [name] agreed to services and verbal consent obtained.  Engaged with [name] [name]  for by telephone forfollow up visit in response to referral for case management and/or care coordination services.   Assessments/Interventions:  Review of past medical history, allergies, medications, health status, including review of consultants reports, laboratory and other test data, was performed as part of comprehensive evaluation and provision of chronic care management services.  SDOH: (Social Determinant of Health) assessments and interventions performed: SDOH Interventions    Flowsheet Row Pulmonary Rehab from 05/05/2022 in [facility] Cardiac and Pulmonary Rehab [name] [name] Outreach Telephone from 05/03/2022 in [place] POPULATION HEALTH DEPARTMENT [name] [name] Outreach Telephone from 04/28/2022 in [place] POPULATION [facility] DEPARTMENT Pulmonary Rehab from 04/05/2022 in [facility] Cardiac and Pulmonary Rehab [name] [name] Outreach Telephone from 03/26/2022 in [place] POPULATION HEALTH DEPARTMENT [name] [name] Outreach Telephone from 03/19/2022 in [place] POPULATION HEALTH DEPARTMENT  SDOH Interventions        Housing Interventions -- -- -- -- -- Intervention Not Indicated  Transportation Interventions -- Intervention Not Indicated -- -- -- --  Depression Interventions/Treatment  Medication -- -- Medication -- Referral to Psychiatry  Stress Interventions -- -- [employer], Provide Counseling -- [employer], Provide Counseling --     BSW completed a telephone outreach with  [name] [name]. [name] [name] stated everything is going well. No resources are needed at this time  Advanced Directives Status:  Not addressed in this encounter.  Care Plan                 Allergies  Allergen Reactions   Cephalexin Hives   Codeine Palpitations, Nausea Only, Nausea And Vomiting, Rash and Shortness Of Breath    "makes heart fly, [name] [name] gets flushed and passes out"   Doxycycline Rash   Propoxyphene Rash and Shortness Of Breath    Increase heart rate   Sulfa Antibiotics Palpitations, Nausea Only, Shortness Of Breath and Hives    "makes heart fly, [name] [name] gets flushed and passes out"   Clindamycin Rash    Tongue swelling, oral sores, and Mouth rash   Lovenox [Enoxaparin Sodium] Hives   Hydrocodone Nausea And Vomiting    Hear racing & breaks out into a cold sweat.   Meropenem Rash    Erythematous, hot, pruritic rash over arms, chest, back, abdomen, and face occurred at the end of meropenem infusion on 02/22/18    Medications Reviewed Today     Reviewed by Audery Amel, MD (Psychiatrist) on 05/04/22 at 1359  Med List Status: <None>   Medication Order Taking? Sig Documenting Provider Last Dose Status Informant  acetaminophen (TYLENOL) 325 MG tablet [id number] No Take 2 tablets (650 mg total) by mouth every 6 (six) hours as needed for mild pain or moderate pain (or Fever >/= 101). Marguerita Merles [place], DO Taking Active Self  amLODipine (NORVASC) 10 MG tablet [id number] No Take 10 mg by mouth daily. [provider] Taking Active Self  ANORO ELLIPTA 62.5-25 MCG/ACT AEPB [id number] No 1 puff daily.  [name] [name] not taking: Reported on 03/01/2022   [provider] Not Taking Active  Self  ARIPiprazole (ABILIFY) 5 MG tablet [id number]  Take 1 tablet (5 mg total) by mouth daily. Clapacs, Jackquline Denmark, MD  Active   ascorbic acid (VITAMIN C) 500 MG tablet [id number] No Take 1 tablet (500 mg total) by mouth daily. Rolly Salter, MD Taking Active Self  atorvastatin (LIPITOR) 80 MG tablet [id number]  No Take 80 mg by mouth daily. [provider] Taking Active Self  Budeson-Glycopyrrol-Formoterol (BREZTRI AEROSPHERE) 160-9-4.8 MCG/ACT AERO [id number] No Inhale 2 puffs into the lungs in the morning and at bedtime. Salena Saner, MD Taking Active Self  busPIRone (BUSPAR) 10 MG tablet [id number]  Take 1 tablet (10 mg total) by mouth 2 (two) times daily. Clapacs, Jackquline Denmark, MD  Active   BUTRANS 7.5 MCG/HR [id number] No Place 1 patch onto the skin once a week. [provider] Taking Active   Calcium Citrate-Vitamin D3 (GNP CALCIUM CITRATE+D MAXIMUM) 315-6.25 MG-MCG TABS [id number] No Take 1 tablet by mouth daily. [provider] Taking Active Self  Cholecalciferol (VITAMIN D) 125 MCG (5000 UT) CAPS [id number] No Take 1 capsule by mouth daily. [provider] Taking Active Self  ELIQUIS 5 MG TABS tablet [id number] No TAKE 1 TABLET BY MOUTH TWICE DAILY  [name] [name] taking differently: Take 5 mg by mouth 2 (two) times daily.   Salena Saner, MD Taking Active Self  famotidine (PEPCID) 20 MG tablet [id number] No Take 1 tablet (20 mg total) by mouth daily. Rolly Salter, MD Taking Active Self  FLUoxetine (PROZAC) 40 MG capsule [id number]  Take 1 capsule (40 mg total) by mouth daily. Clapacs, Jackquline Denmark, MD  Active   folic acid (FOLVITE) 1 MG tablet [id number] No Take 1 mg by mouth daily.  [provider] Taking Active Self  hydroxychloroquine (PLAQUENIL) 200 MG tablet [id number] No Take 200 mg by mouth 2 (two) times daily. [provider] Taking Active Self  levothyroxine (SYNTHROID, LEVOTHROID) 88 MCG tablet [id number] No Take 88 mcg by mouth daily before breakfast. [provider] Taking Active Self  metFORMIN (GLUCOPHAGE-XR) 500 MG 24 hr tablet [id number] No Take 500 mg by mouth daily. [provider] Taking Active Self  methotrexate (RHEUMATREX) 2.5 MG tablet [id number] No Take 2.5 mg by mouth once a week. [provider] Taking Active    metoCLOPramide (REGLAN) 5 MG/5ML solution [id number] No Take 5 mg by mouth 4 (four) times daily -  before meals and at bedtime. [provider] Taking Active Self           Med Note (ROBB, MELANIE A   Wed Mar 17, 2022  9:19 AM) Taking 10 mg 4 times daily  metoprolol tartrate (LOPRESSOR) 25 MG tablet [id number] No Take 25 mg by mouth 2 (two) times daily. [provider] Taking Active Self  oxyCODONE-acetaminophen (PERCOCET/ROXICET) 5-325 MG tablet [id number] No Take 1 tablet by mouth every 8 (eight) hours as needed for severe pain.  [name] [name] not taking: Reported on 03/31/2022   [provider] Not Taking Active Self  pramipexole (MIRAPEX) 0.5 MG tablet [id number] No Take 0.5 mg by mouth at bedtime. [provider] Taking Active   pregabalin (LYRICA) 100 MG capsule [id number] No Take 1 capsule (100 mg total) by mouth 3 (three) times daily. Edward Jolly, MD Taking Active Self  promethazine (PHENERGAN) 12.5 MG tablet [id number] No Take 12.5 mg by mouth. [provider] Taking Active Self  QUEtiapine (SEROQUEL) 300 MG tablet [id number] No TAKE ONE TABLET BY MOUTH AT BEDTIME. Clapacs, Jackquline [place],  MD Taking Active Self           Med Note (ROBB, MELANIE A   Mon May 03, 2022  1:39 PM)    QUEtiapine (SEROQUEL) 300 MG tablet [id number] Yes Take 1 tablet (300 mg total) by mouth at bedtime. Clapacs, Jackquline [place], MD  Active   TRADJENTA 5 MG TABS tablet [id number] No Take 5 mg by mouth daily. [provider] Taking Active Self  zinc sulfate 220 (50 Zn) MG capsule [id number] No Take 220 mg by mouth at bedtime.  [provider] Taking Active Self  zolpidem (AMBIEN) 5 MG tablet [id number]  Take 1 tablet (5 mg total) by mouth at bedtime. Audery Amel, MD  Active             [name] [name] Active Problem List   Diagnosis Date Noted   Gastroparesis 02/28/2022   Allergy to multiple antibiotics 02/28/2022   Lactic acidosis 01/05/2022   Sepsis (HCC) 01/04/2022   Acute  cystitis 12/27/2021   Morbid obesity (HCC)    Depression with anxiety    PAF (paroxysmal atrial fibrillation) (HCC)    Chronic respiratory failure with hypoxia (HCC) 02/07/2020   Paroxysmal A-fib (HCC)    Morbid obesity with BMI of 50.0-59.9, adult (HCC) 07/08/2019   Controlled type 2 diabetes mellitus without complication, without long-term current use of insulin (HCC) 04/06/2019   CKD (chronic kidney disease), stage III (HCC) 04/06/2019   HLD (hyperlipidemia) 04/06/2019   AKI (acute kidney injury) (HCC) 02/12/2019   Diabetic ulcer of left foot (HCC) 02/14/2018   Chronic pain syndrome 03/31/2016   Rheumatoid arthritis (HCC) 12/25/2015   Osteoarthritis, multiple sites 12/24/2015   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[employer]) 12/23/2015   Long term current use of opiate analgesic 12/23/2015   Long term prescription opiate use 12/23/2015   Diabetic peripheral neuropathy (HCC) 12/23/2015   Chronic diastolic CHF (congestive heart failure) (HCC) 07/03/2014   COPD (chronic obstructive pulmonary disease) (HCC) 01/03/2014   Bipolar disorder, unspecified (HCC) 01/03/2014   Borderline personality disorder (HCC) 01/06/2012   Hypothyroidism 06/28/2010   Rheumatoid arthritis involving multiple sites with positive rheumatoid factor (HCC) 06/28/2010   Essential (primary) hypertension 01/27/2005   Major depressive disorder, recurrent episode, moderate (HCC) 06/03/2004    Conditions to be addressed/monitored per PCP order:   community resources  Care Plan : LCSW Plan of Care  Updates made by Shaune Leeks since 05/10/2022 12:00 AM     Problem: Depression Identification (Depression)      Long-Range Goal: Depressive Symptoms Identified   Start Date: 03/19/2022  Priority: High  Note:   Priority: High  Timeframe:  Long-Range Goal Priority:  High Start Date:   03/19/22           Expected End Date:  ongoing                     Follow Up Date--04/15/22 at 1 pm  - keep 90  percent of scheduled appointments -consider counseling or psychiatry -consider bumping up your self-care  -consider creating a stronger support network   Why is this important?             Beating depression may take some time.            If you don't feel better right away, don't give up on your treatment plan.    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 [name] [name] requires Support, Education,  Resources, Referrals, Advocacy, and Care Coordination, in order to meet Unmet Mental Health Needs. [name] [name] will implement clinical interventions discussed today to decrease symptoms and increase knowledge and/or ability of: coping skills. Mental Health Concerns and Social Isolation [name] [name] lacks knowledge of available community counseling agencies and resources.  Clinical Goal(s): verbalize understanding of plan for management of Anxiety, Depression, and Stress and demonstrate a reduction in symptoms. [name] [name] will connect with a provider for ongoing mental health treatment, increase coping skills, healthy habits, self-management skills, and stress reduction        Clinical Interventions:  Assessed [name] [name]'s previous and current treatment, coping skills, support system and barriers to care. [name] [name] provided hx  Verbalization of feelings encouraged, motivational interviewing employed Emotional support provided, positive coping strategies explored. Establishing healthy boundaries emphasized and healthy self-care education provided [name] [name] was educated on available mental health resources within their area that accept Medicaid and offer counseling and psychiatry. [name] [name] reports that [name] [name] is on psychotropics already but needs additional mental health follow up due to added stressors.  [name] [name] was successful in identifying triggers to depression symptoms, in addition, to healthy coping skills. [name] [name] reports significant worsening mood impacting her ability to  function appropriately and carry out daily task. [name] [name] is agreeable to referral to [facility] for counseling and psychiatry. [facility] LCSW made referral on 03/19/22. LCSW provided education on relaxation techniques such as meditation, deep breathing, massage, grounding exercises or yoga that can activate the body's relaxation response and ease symptoms of stress and anxiety. LCSW ask that when pt is struggling with difficult emotions and racing thoughts that they start this relaxation response process. LCSW provided extensive education on healthy coping skills for anxiety. SW used active and reflective listening, validated [name] [name]'s feelings/concerns, and provided emotional support. [name] [name] will work on implementing appropriate self-care habits into their daily routine such as: staying positive, writing a gratitude list, drinking water, staying active around the house, taking their medications as prescribed, combating negative thoughts or emotions and staying connected with their family and friends. Positive reinforcement provided for this decision to work on this. LCSW provided education on healthy sleep hygiene and what that looks like. LCSW encouraged [name] [name] to implement a night time routine into their schedule that works best for them and that they are able to maintain. Advised [name] [name] to implement deep breathing/grounding/meditation/self-care exercises into their nightly routine to combat racing thoughts at night. LCSW encouraged [name] [name] to wake up at the same time each day, make their sleeping environment comfortable, exercise when able, to limit naps and to not eat or drink anything right before bed.  Motivational Interviewing employed Depression screen reviewed  PHQ2/ PHQ9 completed or reviewed  Mindfulness or Relaxation training provided Active listening / Reflection utilized  Advance Care and HCPOA education provided Emotional Support Provided Problem Solving /Task Center strategies reviewed Provided  psychoeducation for mental health needs  Provided brief CBT  Reviewed mental health medications and discussed importance of compliance:  Quality of sleep assessed & Sleep Hygiene techniques promoted  Participation in counseling encouraged  Verbalization of feelings encouraged  Suicidal Ideation/Homicidal Ideation assessed: [name] [name] denies SI/HI  Review resources, discussed options and provided [name] [name] information about  Mental Health Resources Inter-disciplinary care team collaboration (see longitudinal plan of care) 03/26/22 update- [name] [name] and [facility] LCSW completed joint call to [facility] and was informed that [name] [name] will need an additional referral made in order to get both counseling and psychiatry. Referral adjustment made. Per [facility], referral has to be reviewed first before scheduling so [name] [name] is  unable to schedule her intake appointment just yet. Email sent to [name] [name] with resource contact information. [facility] staff stated that they are booked up until February of 2024. UPDATE- [name] [name] report no longer wanting a new mental health provider as [name] [name] does not want to gain therapy and does not wish to change her current psychiatrist. Goal closed [name] [name] Goals/Self-Care Activities: Over the next 120 days Attend scheduled medical appointments Utilize healthy coping skills and supportive resources discussed Contact PCP with any questions or concerns Keep 90 percent of counseling appointments Call your insurance provider for more information about your Enhanced Benefits  Check out counseling resources provided  Begin personal counseling with LCSW, to reduce and manage symptoms of Depression and Stress, until well-established with mental health provider Accept all calls from representative with [facility] in an effort to establish ongoing mental health counseling and supportive services. Incorporate into daily practice - relaxation techniques, deep breathing exercises, and mindfulness meditation strategies. Talk  about feelings with friends, family members, spiritual advisor, etc. Contact LCSW directly [phone]), if you have questions, need assistance, or if additional social work needs are identified between now and our next scheduled telephone outreach call. Call [phone] for mental health hotline/crisis line if needed (24/7 available) Try techniques to reduce symptoms of anxiety/negative thinking (deep breathing, distraction, positive self talk, etc)  - develop a personal safety plan - develop a plan to deal with triggers like holidays, anniversaries - exercise at least 2 to 3 times per week - have a plan for how to handle bad days - journal feelings and what helps to feel better or worse - spend time or talk with others at least 2 to 3 times per week - watch for early signs of feeling worse - begin personal counseling - call and visit an old friend - check out volunteer opportunities - join a support group - laugh; watch a funny movie or comedian - learn and use visualization or guided imagery - perform a random act of kindness - practice relaxation or meditation daily - start or continue a personal journal - practice positive thinking and self-talk -continue with compliance of taking medication  -identify current effective and ineffective coping strategies.  -implement positive self-talk in care to increase self-esteem, confidence and feelings of control.  -consider alternative and complementary therapy approaches such as meditation, mindfulness or yoga.  -journaling, prayer, worship services, meditation or pastoral counseling.  -increase participation in pleasurable group activities such as hobbies, singing, sports or volunteering).  -consider the use of meditative movement therapy such as tai chi, yoga or qigong.  -start a regular daily exercise program based on tolerance, ability and [name] [name] choice to support positive thinking and activity    The following coping skill education was  provided for stress relief and mental health management: "When your car dies or a deadline looms, how do you respond? Long-term, low-grade or acute stress takes a serious toll on your body and mind, so don't ignore feelings of constant tension. Stress is a natural part of life. However, too much stress can harm our health, especially if it continues every day. This is chronic stress and can put you at risk for heart problems like heart disease and depression. Understand what's happening inside your body and learn simple coping skills to combat the negative impacts of everyday stressors.  Types of Stress There are two types of stress: Emotional - types of emotional stress are relationship problems, pressure at work, financial worries, experiencing discrimination or having a major life  change. Physical - Examples of physical stress include being sick having pain, not sleeping well, recovery from an injury or having an alcohol and drug use disorder. Fight or Flight Sudden or ongoing stress activates your nervous system and floods your bloodstream with adrenaline and cortisol, two hormones that raise blood pressure, increase heart rate and spike blood sugar. These changes pitch your body into a fight or flight response. That enabled our ancestors to outrun saber-toothed tigers, and it's helpful today for situations like dodging a car accident. But most modern chronic stressors, such as finances or a challenging relationship, keep your body in that heightened state, which hurts your health. Effects of Too Much Stress If constantly under stress, most of [name] will eventually start to function less well.  Multiple studies link chronic stress to a higher risk of heart disease, stroke, depression, weight gain, memory loss and even premature death, so it's important to recognize the warning signals. Talk to your doctor about ways to manage stress if you're experiencing any of these symptoms: Prolonged periods of poor  sleep. Regular, severe headaches. Unexplained weight loss or gain. Feelings of isolation, withdrawal or worthlessness. Constant anger and irritability. Loss of interest in activities. Constant worrying or obsessive thinking. Excessive alcohol or drug use. Inability to concentrate.  10 Ways to Cope with Chronic Stress It's key to recognize stressful situations as they occur because it allows you to focus on managing how you react. We all need to know when to close our eyes and take a deep breath when we feel tension rising. Use these tips to prevent or reduce chronic stress. 1. Rebalance Work and Home All work and no play? If you're spending too much time at the office, intentionally put more dates in your calendar to enjoy time for fun, either alone or with others. 2. Get Regular Exercise Moving your body on a regular basis balances the nervous system and increases blood circulation, helping to flush out stress hormones. Even a daily 20-minute walk makes a difference. Any kind of exercise can lower stress and improve your mood ? just pick activities that you enjoy and make it a regular habit. 3. Eat Well and Limit Alcohol and Stimulants Alcohol, nicotine and caffeine may temporarily relieve stress but have negative health impacts and can make stress worse in the long run. Well-nourished bodies cope better, so start with a good breakfast, add more organic fruits and vegetables for a well-balanced diet, avoid processed foods and sugar, try herbal tea and drink more water. 4. Connect with Supportive People Talking face to face with another person releases hormones that reduce stress. Lean on those good listeners in your life. 5. [place] Time Do you enjoy gardening, reading, listening to music or some other creative pursuit? Engage in activities that bring you pleasure and joy; research shows that reduces stress by almost half and lowers your heart rate, too. 6. Practice Meditation, Stress  Reduction or Yoga Relaxation techniques activate a state of restfulness that counterbalances your body's fight-or-flight hormones. Even if this also means a 10-minute break in a long day: listen to music, read, go for a walk in nature, do a hobby, take a bath or spend time with a friend. Also consider doing a mindfulness exercise or try a daily deep breathing or imagery practice. Deep Breathing Slow, calm and deep breathing can help you relax. Try these steps to focus on your breathing and repeat as needed. Find a comfortable position and close your eyes. Exhale  and drop your shoulders. Breathe in through your nose; fill your lungs and then your belly. Think of relaxing your body, quieting your mind and becoming calm and peaceful. Breathe out slowly through your nose, relaxing your belly. Think of releasing tension, pain, worries or distress. Repeat steps three and four until you feel relaxed. Imagery This involves using your mind to excite the senses -- sound, vision, smell, taste and feeling. This may help ease your stress. Begin by getting comfortable and then do some slow breathing. Imagine a place you love being at. It could be somewhere from your childhood, somewhere you vacationed or just a place in your imagination. Feel how it is to be in the place you're imagining. Pay attention to the sounds, air, colors, and who is there with you. This is a place where you feel cared for and loved. All is well. You are safe. Take in all the smells, sounds, tastes and feelings. As you do, feel your body being nourished and healed. Feel the calm that surrounds you. Breathe in all the good. Breathe out any discomfort or tension. 7. Sleep Enough If you get less than seven to eight hours of sleep, your body won't tolerate stress as well as it could. If stress keeps you up at night, address the cause, and add extra meditation into your day to make up for the lost z's. Try to get seven to nine hours of sleep  each night. Make a regular bedtime schedule. Keep your room dark and cool. Try to avoid computers, TV, cell phones and tablets before bed. 8. Bond with Connections You Enjoy Go out for a coffee with a friend, chat with a neighbor, call a family member, visit with a clergy member, or even hang out with your pet. Clinical studies show that spending even a short time with a companion animal can cut anxiety levels almost in half. 9. Take a Vacation Getting away from it all can reset your stress tolerance by increasing your mental and emotional outlook, which makes you a happier, more productive person upon return. Leave your cellphone and laptop at home! 10. See a Counselor, Coach or Therapist If negative thoughts overwhelm your ability to make positive changes, it's time to seek professional help. Make an appointment today--your health and life are worth it."  If you are experiencing a Mental Health or [place] or need someone to talk to, please call the Suicide and Crisis Lifeline: [place] completed a telephone outreach with [name] [name]. [name] [name] stated everything is going well. No resources are needed at this time.     Follow up:  [name] [name] agrees to Care Plan and Follow-up.  Plan: The Managed Medicaid care management team will reach out to the [name] [name] again over the next 60 days.  Date/time of next scheduled Social Work care management/care coordination outreach:  07/09/22  Mickel Fuchs, Arita Miss, [place] Medicaid Team  [phone]

## 2022-05-10 NOTE — Progress Notes (Signed)
Daily Session Note  Patient Details  Name: [name] [name] MRN: [id number] Date of Birth: [birth year] Referring Provider:   Flowsheet Row Pulmonary Rehab from 04/05/2022 in [facility]  Referring Provider Galen Daft       Encounter Date: 05/10/2022  Check In:  Session Check In - 05/10/22 1609       Check-In   Supervising physician immediately available to respond to emergencies See telemetry face sheet for immediately available ER MD    Location [facility] & Pul[facility]    Staff Present Renita Papa, RN BSN;Joseph Tessie Fass, RCP,RRT,BSRT;Noah [place], [place], Exercise Physiologist    Virtual Visit No    Medication changes reported     No    Fall or balance concerns reported    No    Warm-up and Cool-down Performed on first and last piece of equipment    Resistance Training Performed Yes    VAD Patient? No    PAD/SET Patient? No      Pain Assessment   Currently in Pain? No/denies                Social History   Tobacco Use  Smoking Status Former   Packs/day: 2.00   Years: 27.00   Total pack years: 54.00   Types: Cigarettes   Quit date: 07/30/1999   Years since quitting: 22.7  Smokeless Tobacco Never  Tobacco Comments   quit in 2001 2-2.5 a day    Goals Met:  Independence with exercise equipment Exercise tolerated well No report of concerns or symptoms today Strength training completed today  Goals Unmet:  Not Applicable  Comments: Pt able to follow exercise prescription today without complaint.  Will continue to monitor for progression.    Dr. Emily Filbert is Medical Director for [place].  Dr. Ottie Glazier is Medical Director for [facility].

## 2022-05-11 MED ORDER — METOCLOPRAMIDE 5 MG/5 ML ORAL SOLUTION
3 refills | 0 days
Start: 2022-05-11 — End: ?

## 2022-05-12 ENCOUNTER — Encounter: Payer: Self-pay | Admitting: *Deleted

## 2022-05-12 ENCOUNTER — Encounter: Payer: Medicaid Other | Admitting: *Deleted

## 2022-05-12 DIAGNOSIS — J8410 Pulmonary fibrosis, unspecified: Secondary | ICD-10-CM

## 2022-05-12 MED ORDER — PRAMIPEXOLE 0.125 MG TABLET
ORAL_TABLET | ORAL | 2 refills | 0 days
Start: 2022-05-12 — End: ?

## 2022-05-12 NOTE — Progress Notes (Signed)
Pulmonary Individual Treatment Plan  Patient Details  Name: [name] [name] MRN: [id number] Date of Birth: [birth year] Referring Provider:   Flowsheet Row Pulmonary Rehab from 04/05/2022 in [facility] Cardiac and Pulmonary Rehab  Referring Provider Francene Boyers       Initial Encounter Date:  Flowsheet Row Pulmonary Rehab from 04/05/2022 in [facility] Cardiac and Pulmonary Rehab  Date 04/05/22       Visit Diagnosis: Pulmonary fibrosis (HCC)  Patient's Home Medications on Admission:  Current Outpatient Medications:    acetaminophen (TYLENOL) 325 MG tablet, Take 2 tablets (650 mg total) by mouth every 6 (six) hours as needed for mild pain or moderate pain (or Fever >/= 101)., Disp: 20 tablet, Rfl: 0   amLODipine (NORVASC) 10 MG tablet, Take 10 mg by mouth daily., Disp: , Rfl:    ANORO ELLIPTA 62.5-25 MCG/ACT AEPB, 1 puff daily. (Patient not taking: Reported on 03/01/2022), Disp: , Rfl:    ARIPiprazole (ABILIFY) 5 MG tablet, Take 1 tablet (5 mg total) by mouth daily., Disp: 30 tablet, Rfl: 5   ascorbic acid (VITAMIN C) 500 MG tablet, Take 1 tablet (500 mg total) by mouth daily., Disp: 30 tablet, Rfl: 0   atorvastatin (LIPITOR) 80 MG tablet, Take 80 mg by mouth daily., Disp: , Rfl:    Budeson-Glycopyrrol-Formoterol (BREZTRI AEROSPHERE) 160-9-4.8 MCG/ACT AERO, Inhale 2 puffs into the lungs in the morning and at bedtime., Disp: 5.9 g, Rfl: 0   busPIRone (BUSPAR) 10 MG tablet, Take 1 tablet (10 mg total) by mouth 2 (two) times daily., Disp: 60 tablet, Rfl: 5   BUTRANS 7.5 MCG/HR, Place 1 patch onto the skin once a week., Disp: , Rfl:    Calcium Citrate-Vitamin D3 (GNP CALCIUM CITRATE+D MAXIMUM) 315-6.25 MG-MCG TABS, Take 1 tablet by mouth daily., Disp: , Rfl:    Cholecalciferol (VITAMIN D) 125 MCG (5000 UT) CAPS, Take 1 capsule by mouth daily., Disp: , Rfl:    ELIQUIS 5 MG TABS tablet, TAKE 1 TABLET BY MOUTH TWICE DAILY (Patient taking differently: Take 5 mg by mouth 2 (two) times daily.), Disp: 56 tablet,  Rfl: 0   famotidine (PEPCID) 20 MG tablet, Take 1 tablet (20 mg total) by mouth daily., Disp: 30 tablet, Rfl: 0   FLUoxetine (PROZAC) 40 MG capsule, Take 1 capsule (40 mg total) by mouth daily., Disp: 30 capsule, Rfl: 5   folic acid (FOLVITE) 1 MG tablet, Take 1 mg by mouth daily. , Disp: , Rfl:    hydroxychloroquine (PLAQUENIL) 200 MG tablet, Take 200 mg by mouth 2 (two) times daily., Disp: , Rfl:    levothyroxine (SYNTHROID, LEVOTHROID) 88 MCG tablet, Take 88 mcg by mouth daily before breakfast., Disp: , Rfl:    metFORMIN (GLUCOPHAGE-XR) 500 MG 24 hr tablet, Take 500 mg by mouth daily., Disp: , Rfl:    methotrexate (RHEUMATREX) 2.5 MG tablet, Take 2.5 mg by mouth once a week., Disp: , Rfl:    metoCLOPramide (REGLAN) 5 MG/5ML solution, Take 5 mg by mouth 4 (four) times daily -  before meals and at bedtime., Disp: , Rfl:    metoprolol tartrate (LOPRESSOR) 25 MG tablet, Take 25 mg by mouth 2 (two) times daily., Disp: , Rfl:    oxyCODONE-acetaminophen (PERCOCET/ROXICET) 5-325 MG tablet, Take 1 tablet by mouth every 8 (eight) hours as needed for severe pain. (Patient not taking: Reported on 03/31/2022), Disp: , Rfl:    pramipexole (MIRAPEX) 0.5 MG tablet, Take 0.5 mg by mouth at bedtime., Disp: , Rfl:    pregabalin (LYRICA)  100 MG capsule, Take 1 capsule (100 mg total) by mouth 3 (three) times daily., Disp: 90 capsule, Rfl: 5   promethazine (PHENERGAN) 12.5 MG tablet, Take 12.5 mg by mouth., Disp: , Rfl:    QUEtiapine (SEROQUEL) 300 MG tablet, TAKE ONE TABLET BY MOUTH AT BEDTIME., Disp: 30 tablet, Rfl: 5   QUEtiapine (SEROQUEL) 300 MG tablet, Take 1 tablet (300 mg total) by mouth at bedtime., Disp: 30 tablet, Rfl: 5   TRADJENTA 5 MG TABS tablet, Take 5 mg by mouth daily., Disp: , Rfl:    zinc sulfate 220 (50 Zn) MG capsule, Take 220 mg by mouth at bedtime. , Disp: , Rfl:    zolpidem (AMBIEN) 5 MG tablet, Take 1 tablet (5 mg total) by mouth at bedtime., Disp: 30 tablet, Rfl: 5  Past Medical  History: Past Medical History:  Diagnosis Date   Abdominal pain 02/12/2019   Abdominal wall hernia 01/29/2013   Acute metabolic encephalopathy 07/08/2019   AMS (altered mental status) 12/28/2021   Anxiety    Arthritis    Rheumatoid   C. difficile colitis    Chronic diastolic heart failure (HCC)    COVID-19 03/23/2019   Diagnosed at [facility] (send-out) on 03/23/2019   Depression    Diabetes mellitus    states no meds or diet restrictions  at present   Diastolic CHF (HCC)    Esophagitis    Fluid retention    GERD (gastroesophageal reflux disease)    Hiatal hernia    Hypertension    Hypokalemia due to loss of potassium 10/21/2015   Overview:  Associated with 3 weeks of diarrhea  And QT prolongation.   Hypothyroidism    IBS (irritable bowel syndrome)    Moderate episode of recurrent major depressive disorder (HCC) 06/03/2004   Morbid obesity (HCC)    MRSA (methicillin resistant Staphylococcus aureus) infection 11/2017   left inner thigh abcess   Neurogenic bladder    has pacemaker   Neuropathy    Obesity    Panic attacks    Pneumonia due to COVID-19 virus    Rheumatoid arthritis (HCC)    Sleep apnea    STATES SEVERE, CANT TOLERATE MASK- LAST STUDY YEARS AGO    Tobacco Use: Social History   Tobacco Use  Smoking Status Former   Packs/day: 2.00   Years: 27.00   Total pack years: 54.00   Types: Cigarettes   Quit date: 07/30/1999   Years since quitting: 22.8  Smokeless Tobacco Never  Tobacco Comments   quit in 2001 2-2.5 a day    Labs: Review Flowsheet  More data exists      Latest Ref Rng & Units 07/09/2019 07/10/2019 01/10/2020 12/27/2021 01/04/2022  Labs for ITP Cardiac and Pulmonary Rehab  Hemoglobin A1c 4.8 - 5.6 % - - 10.8  - 6.8   PH, Arterial 7.350 - 7.450 7.38  7.48  - - -  PCO2 arterial 32.0 - 48.0 mmHg 36  25  - - -  Bicarbonate 20.0 - 28.0 mmol/L 21.3  18.6  - 27.2  -  Acid-base deficit 0.0 - 2.0 mmol/L 3.3  3.3  - - -  O2 Saturation % 96.6  97.3  - 65.4  -      Pulmonary Assessment Scores:  Pulmonary Assessment Scores     Row Name 04/05/22 1713         ADL UCSD   ADL Phase Entry     SOB Score total 81     Rest 2  Walk 3     Stairs 5     Bath 4     Dress 3     Shop 3       CAT Score   CAT Score 22       mMRC Score   mMRC Score 2              UCSD: Self-administered rating of dyspnea associated with activities of daily living (ADLs) 6-point scale (0 = "not at all" to 5 = "maximal or unable to do because of breathlessness")  Scoring Scores range from 0 to 120.  Minimally important difference is 5 units  CAT: CAT can identify the health impairment of COPD patients and is better correlated with disease progression.  CAT has a scoring range of zero to 40. The CAT score is classified into four groups of low (less than 10), medium (10 - 20), high (21-30) and very high (31-40) based on the impact level of disease on health status. A CAT score over 10 suggests significant symptoms.  A worsening CAT score could be explained by an exacerbation, poor medication adherence, poor inhaler technique, or progression of COPD or comorbid conditions.  CAT MCID is 2 points  mMRC: mMRC (Modified Medical Research Council) Dyspnea Scale is used to assess the degree of baseline functional disability in patients of respiratory disease due to dyspnea. No minimal important difference is established. A decrease in score of 1 point or greater is considered a positive change.   Pulmonary Function Assessment:  Pulmonary Function Assessment - 03/31/22 1422       Breath   Shortness of Breath Yes;Limiting activity;Panic with Shortness of Breath             Exercise Target Goals: Exercise Program Goal: Individual exercise prescription set using results from initial 6 min walk test and THRR while considering  patient's activity barriers and safety.   Exercise Prescription Goal: Initial exercise prescription builds to 30-45 minutes a day of  aerobic activity, 2-3 days per week.  Home exercise guidelines will be given to patient during program as part of exercise prescription that the participant will acknowledge.  Education: Aerobic Exercise: - Group verbal and visual presentation on the components of exercise prescription. Introduces F.I.T.T principle from ACSM for exercise prescriptions.  Reviews F.I.T.T. principles of aerobic exercise including progression. Written material given at graduation.   Education: Resistance Exercise: - Group verbal and visual presentation on the components of exercise prescription. Introduces F.I.T.T principle from ACSM for exercise prescriptions  Reviews F.I.T.T. principles of resistance exercise including progression. Written material given at graduation.    Education: Exercise & Equipment Safety: - Individual verbal instruction and demonstration of equipment use and safety with use of the equipment. Flowsheet Row Pulmonary Rehab from 04/05/2022 in [facility] Cardiac and Pulmonary Rehab  Date 04/05/22  Educator [facility]  Instruction Review Code 1- Verbalizes Understanding       Education: Exercise Physiology & General Exercise Guidelines: - Group verbal and written instruction with models to review the exercise physiology of the cardiovascular system and associated critical values. Provides general exercise guidelines with specific guidelines to those with heart or lung disease.    Education: Flexibility, Balance, Mind/Body Relaxation: - Group verbal and visual presentation with interactive activity on the components of exercise prescription. Introduces F.I.T.T principle from ACSM for exercise prescriptions. Reviews F.I.T.T. principles of flexibility and balance exercise training including progression. Also discusses the mind body connection.  Reviews various relaxation techniques to help reduce and  manage stress (i.e. Deep breathing, progressive muscle relaxation, and visualization). Balance handout  provided to take home. Written material given at graduation.   Activity Barriers & Risk Stratification:  Activity Barriers & Cardiac Risk Stratification - 04/05/22 1702       Activity Barriers & Cardiac Risk Stratification   Activity Barriers Shortness of Breath;History of Falls;Deconditioning;Assistive Device;Balance Concerns;Joint Problems;Arthritis             6 Minute Walk:  6 Minute Walk     Row Name 04/05/22 1659         6 Minute Walk   Phase Initial     Distance 530 feet     Walk Time 6 minutes     # of Rest Breaks 0     MPH 1     METS 1     RPE 15     Perceived Dyspnea  3     VO2 Peak 2     Symptoms Yes (comment)     Comments SOB     Resting HR 62 bpm     Resting BP 120/70     Resting Oxygen Saturation  95 %     Exercise Oxygen Saturation  during 6 min walk 91 %     Max Ex. HR 77 bpm     Max Ex. BP 130/70     2 Minute Post BP 118/82       Interval HR   1 Minute HR 69     2 Minute HR 75     3 Minute HR 77     4 Minute HR 77     5 Minute HR 77     6 Minute HR 73     2 Minute Post HR 69     Interval Heart Rate? Yes       Interval Oxygen   Interval Oxygen? Yes     Baseline Oxygen Saturation % 95 %     1 Minute Oxygen Saturation % 95 %     1 Minute Liters of Oxygen 3 L     2 Minute Oxygen Saturation % 91 %     2 Minute Liters of Oxygen 3 L     3 Minute Oxygen Saturation % 91 %     3 Minute Liters of Oxygen 3 L     4 Minute Oxygen Saturation % 92 %     4 Minute Liters of Oxygen 3 L     5 Minute Oxygen Saturation % 91 %     5 Minute Liters of Oxygen 3 L     6 Minute Oxygen Saturation % 92 %     6 Minute Liters of Oxygen 3 L     2 Minute Post Oxygen Saturation % 97 %     2 Minute Post Liters of Oxygen 3 L             Oxygen Initial Assessment:  Oxygen Initial Assessment - 03/31/22 1421       Home Oxygen   Home Oxygen Device Home Concentrator;E-Tanks;Portable Concentrator    Sleep Oxygen Prescription Continuous    Liters per minute 3     Home Exercise Oxygen Prescription Continuous    Liters per minute 3    Home Resting Oxygen Prescription Continuous    Liters per minute 3    Compliance with Home Oxygen Use Yes      Initial 6 min Walk   Oxygen Used Continuous    Liters  per minute 3      Program Oxygen Prescription   Program Oxygen Prescription Continuous    Liters per minute 3      Intervention   Short Term Goals To learn and exhibit compliance with exercise, home and travel O2 prescription;To learn and understand importance of maintaining oxygen saturations>88%;To learn and demonstrate proper use of respiratory medications;To learn and understand importance of monitoring SPO2 with pulse oximeter and demonstrate accurate use of the pulse oximeter.;To learn and demonstrate proper pursed lip breathing techniques or other breathing techniques.     Long  Term Goals Verbalizes importance of monitoring SPO2 with pulse oximeter and return demonstration;Exhibits proper breathing techniques, such as pursed lip breathing or other method taught during program session;Demonstrates proper use of MDI's;Compliance with respiratory medication;Maintenance of O2 saturations>88%;Exhibits compliance with exercise, home  and travel O2 prescription             Oxygen Re-Evaluation:  Oxygen Re-Evaluation     Row Name 04/07/22 1626 05/05/22 1619           Program Oxygen Prescription   Program Oxygen Prescription -- Continuous      Liters per minute -- 3        Home Oxygen   Home Oxygen Device -- Home Concentrator;E-Tanks;Portable Concentrator      Sleep Oxygen Prescription -- Continuous      Liters per minute -- 3      Home Exercise Oxygen Prescription -- Continuous      Liters per minute -- 3      Home Resting Oxygen Prescription -- Continuous      Liters per minute -- 3      Compliance with Home Oxygen Use -- Yes        Goals/Expected Outcomes   Short Term Goals To learn and exhibit compliance with exercise, home and  travel O2 prescription;To learn and understand importance of maintaining oxygen saturations>88%;To learn and demonstrate proper use of respiratory medications;To learn and understand importance of monitoring SPO2 with pulse oximeter and demonstrate accurate use of the pulse oximeter.;To learn and demonstrate proper pursed lip breathing techniques or other breathing techniques.  To learn and understand importance of maintaining oxygen saturations>88%;To learn and understand importance of monitoring SPO2 with pulse oximeter and demonstrate accurate use of the pulse oximeter.      Long  Term Goals Verbalizes importance of monitoring SPO2 with pulse oximeter and return demonstration;Exhibits proper breathing techniques, such as pursed lip breathing or other method taught during program session;Demonstrates proper use of MDI's;Compliance with respiratory medication;Maintenance of O2 saturations>88%;Exhibits compliance with exercise, home  and travel O2 prescription Maintenance of O2 saturations>88%;Verbalizes importance of monitoring SPO2 with pulse oximeter and return demonstration      Comments Reviewed PLB technique with pt.  Talked about how it works and it's importance in maintaining their exercise saturations. She has a pulse oximeter to check her/his oxygen saturation at home. Informed and explained why it is important to have one. Reviewed that oxygen saturations should be 88 percent and above. Patient verbalizes understanding.      Goals/Expected Outcomes Short: Become more profiecient at using PLB.   Long: Become independent at using PLB. Short: monitor oxygen at home with exertion. Long: maintain oxygen saturations above 88 percent independently.               Oxygen Discharge (Final Oxygen Re-Evaluation):  Oxygen Re-Evaluation - 05/05/22 1619       Program Oxygen Prescription   Program Oxygen Prescription  Continuous    Liters per minute 3      Home Oxygen   Home Oxygen Device Home  Concentrator;E-Tanks;Portable Concentrator    Sleep Oxygen Prescription Continuous    Liters per minute 3    Home Exercise Oxygen Prescription Continuous    Liters per minute 3    Home Resting Oxygen Prescription Continuous    Liters per minute 3    Compliance with Home Oxygen Use Yes      Goals/Expected Outcomes   Short Term Goals To learn and understand importance of maintaining oxygen saturations>88%;To learn and understand importance of monitoring SPO2 with pulse oximeter and demonstrate accurate use of the pulse oximeter.    Long  Term Goals Maintenance of O2 saturations>88%;Verbalizes importance of monitoring SPO2 with pulse oximeter and return demonstration    Comments She has a pulse oximeter to check her/his oxygen saturation at home. Informed and explained why it is important to have one. Reviewed that oxygen saturations should be 88 percent and above. Patient verbalizes understanding.    Goals/Expected Outcomes Short: monitor oxygen at home with exertion. Long: maintain oxygen saturations above 88 percent independently.             Initial Exercise Prescription:  Initial Exercise Prescription - 04/05/22 1700       Date of Initial Exercise RX and Referring Provider   Date 04/05/22    Referring Provider Francene BoyersGonzolez      Oxygen   Oxygen Continuous    Liters 3    Maintain Oxygen Saturation 88% or higher      NuStep   Level 1    SPM 80    Minutes 15    METs 1      Biostep-RELP   Level 1    SPM 80    Minutes 15    METs 1      Track   Laps 5    Minutes 15    METs 1.27      Prescription Details   Frequency (times per week) 2    Duration Progress to 30 minutes of continuous aerobic without signs/symptoms of physical distress      Intensity   THRR 40-80% of Max Heartrate 101-140    Ratings of Perceived Exertion 11-13    Perceived Dyspnea 0-4      Progression   Progression Continue to progress workloads to maintain intensity without signs/symptoms of physical  distress.      Resistance Training   Training Prescription Yes    Weight 2    Reps 10-15             Perform Capillary Blood Glucose checks as needed.  Exercise Prescription Changes:   Exercise Prescription Changes     Row Name 04/05/22 1700 04/15/22 1100           Response to Exercise   Blood Pressure (Admit) 120/70 118/64      Blood Pressure (Exercise) 130/70 124/60      Blood Pressure (Exit) 118/82 110/62      Heart Rate (Admit) 62 bpm 71 bpm      Heart Rate (Exercise) 77 bpm 84 bpm      Heart Rate (Exit) 73 bpm 76 bpm      Oxygen Saturation (Admit) 95 % 98 %      Oxygen Saturation (Exercise) 91 % 92 %      Oxygen Saturation (Exit) 97 % 96 %      Rating of Perceived Exertion (Exercise) 15 15  Perceived Dyspnea (Exercise) 3 3      Symptoms SOB SOB      Comments 6 MWT results 1st full day of exercise      Duration -- Progress to 30 minutes of  aerobic without signs/symptoms of physical distress      Intensity -- THRR unchanged        Progression   Progression -- Continue to progress workloads to maintain intensity without signs/symptoms of physical distress.      Average METs -- 2.07        Resistance Training   Training Prescription -- Yes      Weight -- 2 lb      Reps -- 10-15        Interval Training   Interval Training -- No        Oxygen   Oxygen -- Continuous      Liters -- 3        NuStep   Level -- 2      Minutes -- 15      METs -- 2.6        Track   Laps -- 10      Minutes -- 15      METs -- 1.54        Oxygen   Maintain Oxygen Saturation -- 88% or higher               Exercise Comments:   Exercise Comments     Row Name 04/07/22 1623           Exercise Comments First full day of exercise!  Patient was oriented to gym and equipment including functions, settings, policies, and procedures.  Patient's individual exercise prescription and treatment plan were reviewed.  All starting workloads were established based on the  results of the 6 minute walk test done at initial orientation visit.  The plan for exercise progression was also introduced and progression will be customized based on patient's performance and goals.                Exercise Goals and Review:   Exercise Goals     Row Name 04/05/22 1709             Exercise Goals   Increase Physical Activity Yes       Intervention Provide advice, education, support and counseling about physical activity/exercise needs.;Develop an individualized exercise prescription for aerobic and resistive training based on initial evaluation findings, risk stratification, comorbidities and participant's personal goals.       Expected Outcomes Short Term: Attend rehab on a regular basis to increase amount of physical activity.;Long Term: Add in home exercise to make exercise part of routine and to increase amount of physical activity.;Long Term: Exercising regularly at least 3-5 days a week.       Increase Strength and Stamina Yes       Intervention Provide advice, education, support and counseling about physical activity/exercise needs.;Develop an individualized exercise prescription for aerobic and resistive training based on initial evaluation findings, risk stratification, comorbidities and participant's personal goals.       Expected Outcomes Short Term: Increase workloads from initial exercise prescription for resistance, speed, and METs.;Short Term: Perform resistance training exercises routinely during rehab and add in resistance training at home;Long Term: Improve cardiorespiratory fitness, muscular endurance and strength as measured by increased METs and functional capacity ( )       Able to understand and use rate of perceived exertion (RPE) scale Yes  Intervention Provide education and explanation on how to use RPE scale       Expected Outcomes Short Term: Able to use RPE daily in rehab to express subjective intensity level;Long Term:  Able to use RPE  to guide intensity level when exercising independently       Able to understand and use Dyspnea scale Yes       Intervention Provide education and explanation on how to use Dyspnea scale       Expected Outcomes Short Term: Able to use Dyspnea scale daily in rehab to express subjective sense of shortness of breath during exertion;Long Term: Able to use Dyspnea scale to guide intensity level when exercising independently       Knowledge and understanding of Target Heart Rate Range (THRR) Yes       Intervention Provide education and explanation of THRR including how the numbers were predicted and where they are located for reference       Expected Outcomes Short Term: Able to state/look up THRR;Long Term: Able to use THRR to govern intensity when exercising independently;Short Term: Able to use daily as guideline for intensity in rehab       Able to check pulse independently Yes       Intervention Provide education and demonstration on how to check pulse in carotid and radial arteries.;Review the importance of being able to check your own pulse for safety during independent exercise       Expected Outcomes Short Term: Able to explain why pulse checking is important during independent exercise;Long Term: Able to check pulse independently and accurately       Understanding of Exercise Prescription Yes       Intervention Provide education, explanation, and written materials on patient's individual exercise prescription       Expected Outcomes Short Term: Able to explain program exercise prescription;Long Term: Able to explain home exercise prescription to exercise independently                Exercise Goals Re-Evaluation :  Exercise Goals Re-Evaluation     Row Name 04/07/22 1624 04/15/22 1150 04/27/22 1346         Exercise Goal Re-Evaluation   Exercise Goals Review Increase Physical Activity;Able to understand and use rate of perceived exertion (RPE) scale;Knowledge and understanding of Target  Heart Rate Range (THRR);Understanding of Exercise Prescription;Able to understand and use Dyspnea scale;Able to check pulse independently;Increase Strength and Stamina Increase Physical Activity;Increase Strength and Stamina;Understanding of Exercise Prescription Increase Physical Activity;Increase Strength and Stamina;Understanding of Exercise Prescription     Comments Reviewed RPE scale, THR and program prescription with pt today.  Pt voiced understanding and was given a copy of goals to take home. [name] completed her first rehab session and did well. She was able to do a full 10 laps on the track, which is double her initial exercise prescription! Walking is challenging for her as she had to take several breaks so we hope to see that improve over time. We will continue to monitor as she progresses in the program. [name] returned to rehab on 04/26/2022 after not attending since the last review. We will encourage her to stay consistent with her attendance so that she can see greater benefits from the program. We will continue to monitor her progress.     Expected Outcomes Short: Use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 program. Patient is informed to tell staff if they have had any med changes and that certain meds they are taking or not taking can be causing shortness of breath.    Expected Outcomes Short: Attend [facility] regularly to improve shortness of breath with ADL's. Long: maintain independence with ADL's             ITP Comments:  ITP Comments     Row Name 03/31/22 1420 04/05/22 1657 04/07/22 1623 04/14/22 0959 05/12/22 0910   ITP Comments Virtual Visit completed. Patient informed on EP and RD appointment and 6 Minute walk test. Patient also informed of patient health questionnaires on My Chart. Patient Verbalizes understanding. Visit diagnosis can be found in [facility] 03/29/2022. Completed and gym orientation.  Initial ITP created and sent for review to Dr. Jinny Sanders, Medical Director. First full day of exercise!  Patient was oriented to gym and equipment including functions, settings, policies, and procedures.  Patient's individual exercise prescription and treatment plan were reviewed.  All starting workloads were established based on the results of the 6 minute walk test done at initial orientation visit.  The plan for exercise progression was also introduced and progression will be customized based on patient's performance and goals. 30 Day review completed. Medical Director ITP review done, changes made as directed, and signed approval by Medical Director.    new to program 30 Day review completed. Medical Director ITP review done, changes made as directed, and signed approval by Medical Director.            Comments:

## 2022-05-12 NOTE — Progress Notes (Signed)
Daily Session Note  Patient Details  Name: [name] [name] MRN: [id number] Date of Birth: December 24, 1960 Referring Provider:   Flowsheet Row Pulmonary Rehab from 04/05/2022 in [facility]  Referring Provider Galen Daft       Encounter Date: 05/12/2022  Check In:  Session Check In - 05/12/22 1606       Check-In   Supervising physician immediately available to respond to emergencies See telemetry face sheet for immediately available ER MD    Location [facility]-Cardiac & Pulmonary Rehab    Staff Present Renita Papa, RN BSN;Joseph Tessie Fass, RCP,RRT,BSRT;Megan Tamala Julian, RN, [place]    Virtual Visit No    Medication changes reported     No    Fall or balance concerns reported    No    Warm-up and Cool-down Performed on first and last piece of equipment    Resistance Training Performed Yes    VAD Patient? No    PAD/SET Patient? No      Pain Assessment   Currently in Pain? No/denies                Social History   Tobacco Use  Smoking Status Former   Packs/day: 2.00   Years: 27.00   Total pack years: 54.00   Types: Cigarettes   Quit date: 07/30/1999   Years since quitting: 22.8  Smokeless Tobacco Never  Tobacco Comments   quit in 2001 2-2.5 a day    Goals Met:  Independence with exercise equipment Exercise tolerated well No report of concerns or symptoms today Strength training completed today  Goals Unmet:  Not Applicable  Comments: Pt able to follow exercise prescription today without complaint.  Will continue to monitor for progression.    Dr. Emily Filbert is Medical Director for [facility].  Dr. Ottie Glazier is Medical Director for [facility] Pulmonary Rehabilitation.

## 2022-05-18 ENCOUNTER — Ambulatory Visit: Payer: Medicaid Other | Admitting: Student in an Organized Health Care Education/Training Program

## 2022-05-18 ENCOUNTER — Encounter: Payer: Self-pay | Admitting: Pulmonary Disease

## 2022-05-18 ENCOUNTER — Ambulatory Visit (INDEPENDENT_AMBULATORY_CARE_PROVIDER_SITE_OTHER): Payer: Medicaid Other | Admitting: Pulmonary Disease

## 2022-05-18 VITALS — BP 124/80 | HR 69 | Temp 98.0°F | Ht 66.75 in | Wt 323.6 lb

## 2022-05-18 DIAGNOSIS — J4489 Other specified chronic obstructive pulmonary disease: Secondary | ICD-10-CM

## 2022-05-18 DIAGNOSIS — R0602 Shortness of breath: Secondary | ICD-10-CM | POA: Diagnosis not present

## 2022-05-18 DIAGNOSIS — J8410 Pulmonary fibrosis, unspecified: Secondary | ICD-10-CM | POA: Diagnosis not present

## 2022-05-18 DIAGNOSIS — Z6841 Body Mass Index (BMI) 40.0 and over, adult: Secondary | ICD-10-CM

## 2022-05-18 DIAGNOSIS — J9611 Chronic respiratory failure with hypoxia: Secondary | ICD-10-CM

## 2022-05-18 DIAGNOSIS — M069 Rheumatoid arthritis, unspecified: Secondary | ICD-10-CM

## 2022-05-18 NOTE — Patient Instructions (Signed)
Continue doing rehab.  I think it is helping him.  Your lungs sounded really good today.  We will see you in follow-up in 4 months time call sooner should any new problems arise.

## 2022-05-18 NOTE — Progress Notes (Signed)
Subjective:    Patient ID: [name] [name], female    DOB: 10-28-1960, 62 y.o.   MRN: [id number] Patient Care Team: Etheleen Nicks, NP as PCP - General Heidi Dach, RN as Case Manager  Chief Complaint  Patient presents with   Follow-up    Has good days and bad. SOB with exertion. No wheezing or cough.     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  Patient also has extreme obesity with obesity hypoventilation.  This is a scheduled visit.  Patient was last seen on 29 March 2022 at that time she was directed to continue Breztri 2 puffs twice a day and albuterol rescue i as needed.  She also was enrolled in pulmonary rehab.  She notes that [name] [name] helps her breathing significantly.  She is compliant with oxygen at 2-3 L/min.  Has not had any fevers, chills or sweats.  As noted, she notes improvement in her dyspnea and stamina on the [place].  Does not endorse any other symptomatology.  Cough is well-controlled.  She feels today she is a little winded however she notes that is the first time she has walked to her appointment.  Normally she presents in transport chair.  She does not have any discernible tachypnea.  Overall she feels rehab is helping though it is "hard".  I assured her though that this will help her get more independent.   DATA 07/08/2019 echocardiogram: LVEF 60 to 65%, grade 1 DD 01/23/2020 PFTs: FEV1 2.19 L or 78% predicted, FVC 2.59 L or 71% predicted, FEV1/FVC 84%, no bronchodilator response.  ERV 10% lung volumes otherwise normal diffusion capacity mildly to moderately impaired 11/03/2021 echocardiogram: LVEF 55 to 60%, grade 1 DD, LA size mildly to moderately dilated. 03/17/2022 PFTs: FEV1 1.61 L or 58% predicted, FVC 2.05 L or 57%  of predicted FEV1/FVC 79% there is response to bronchodilators with regards to airway resistance.  Lung volumes moderately reduced, moderate to severe diffusion defect  Review of Systems A 10 point review of systems was performed and it is as noted above otherwise negative.  Patient Active Problem List   Diagnosis Date Noted   Gastroparesis 02/28/2022   Allergy to multiple antibiotics 02/28/2022   Lactic acidosis 01/05/2022   Sepsis (HCC) 01/04/2022   Acute cystitis 12/27/2021   Morbid obesity (HCC)    Depression with anxiety    PAF (paroxysmal atrial fibrillation) (HCC)    Chronic respiratory failure with hypoxia (HCC) 02/07/2020   Paroxysmal A-fib (HCC)    Morbid obesity with BMI of 50.0-59.9, adult (HCC) 07/08/2019   Controlled type 2 diabetes mellitus without complication, without long-term current use of insulin (HCC) 04/06/2019   CKD (chronic kidney disease), stage III (HCC) 04/06/2019   HLD (hyperlipidemia) 04/06/2019   AKI (acute kidney injury) (HCC) 02/12/2019   Diabetic ulcer of left foot (HCC) 02/14/2018   Chronic pain syndrome 03/31/2016   Rheumatoid arthritis (HCC) 12/25/2015   Osteoarthritis, multiple sites 12/24/2015   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[employer]) 12/23/2015   Long term current use of opiate analgesic 12/23/2015   Long term prescription opiate use 12/23/2015   Diabetic peripheral neuropathy (HCC) 12/23/2015   Chronic diastolic CHF (congestive heart failure) (HCC) 07/03/2014   COPD (chronic obstructive pulmonary disease) (HCC) 01/03/2014  Bipolar disorder, unspecified ([place]) 01/03/2014   Borderline personality disorder ([place]) 01/06/2012   Hypothyroidism 06/28/2010   Rheumatoid arthritis involving multiple sites with positive rheumatoid factor ([place]) 06/28/2010   Essential (primary) hypertension 01/27/2005   Major depressive disorder, recurrent episode, moderate ([place]) 06/03/2004   Social History   Tobacco Use   Smoking status:  Former    Packs/day: 2.00    Years: 27.00    Total pack years: 54.00    Types: Cigarettes    Quit date: 07/30/1999    Years since quitting: 22.8   Smokeless tobacco: Never   Tobacco comments:    quit in 2001 2-2.5 a day  Substance Use Topics   Alcohol use: No   Allergies  Allergen Reactions   Cephalexin Hives   Codeine Palpitations, Nausea Only, Nausea And Vomiting, Rash and Shortness Of Breath    "makes heart fly, she gets flushed and passes out"   Doxycycline Rash   Propoxyphene Rash and Shortness Of Breath    Increase heart rate   Sulfa Antibiotics Palpitations, Nausea Only, Shortness Of Breath and Hives    "makes heart fly, she gets flushed and passes out"   Clindamycin Rash    Tongue swelling, oral sores, and Mouth rash   Lovenox [Enoxaparin Sodium] Hives   Hydrocodone Nausea And Vomiting    Hear racing & breaks out into a cold sweat.   Meropenem Rash    Erythematous, hot, pruritic rash over arms, chest, back, abdomen, and face occurred at the end of meropenem infusion on 02/22/18   Current Meds  Medication Sig   acetaminophen (TYLENOL) 325 MG tablet Take 2 tablets (650 mg total) by mouth every 6 (six) hours as needed for mild pain or moderate pain (or Fever >/= 101).   amLODipine (NORVASC) 10 MG tablet Take 10 mg by mouth daily.   ARIPiprazole (ABILIFY) 5 MG tablet Take 1 tablet (5 mg total) by mouth daily.   ascorbic acid (VITAMIN C) 500 MG tablet Take 1 tablet (500 mg total) by mouth daily.   atorvastatin (LIPITOR) 80 MG tablet Take 80 mg by mouth daily.   Budeson-Glycopyrrol-Formoterol (BREZTRI AEROSPHERE) 160-9-4.8 MCG/ACT AERO Inhale 2 puffs into the lungs in the morning and at bedtime.   busPIRone (BUSPAR) 10 MG tablet Take 1 tablet (10 mg total) by mouth 2 (two) times daily.   BUTRANS 7.5 MCG/HR Place 1 patch onto the skin once a week.   Calcium Citrate-Vitamin D3 (GNP CALCIUM CITRATE+D MAXIMUM) 315-6.25 MG-MCG TABS Take 1 tablet by mouth daily.   Cholecalciferol  (VITAMIN D) 125 MCG (5000 UT) CAPS Take 1 capsule by mouth daily.   ELIQUIS 5 MG TABS tablet TAKE 1 TABLET BY MOUTH TWICE DAILY (Patient taking differently: Take 5 mg by mouth 2 (two) times daily.)   famotidine (PEPCID) 20 MG tablet Take 1 tablet (20 mg total) by mouth daily.   FLUoxetine (PROZAC) 40 MG capsule Take 1 capsule (40 mg total) by mouth daily.   folic acid (FOLVITE) 1 MG tablet Take 1 mg by mouth daily.    hydroxychloroquine (PLAQUENIL) 200 MG tablet Take 200 mg by mouth 2 (two) times daily.   levothyroxine (SYNTHROID, LEVOTHROID) 88 MCG tablet Take 88 mcg by mouth daily before breakfast.   metFORMIN (GLUCOPHAGE-XR) 500 MG 24 hr tablet Take 500 mg by mouth daily.   methotrexate (RHEUMATREX) 2.5 MG tablet Take 2.5 mg by mouth once a week.   metoCLOPramide (REGLAN) 5 MG/5ML solution Take 5 mg by mouth 4 (four) times  daily -  before meals and at bedtime.   metoprolol tartrate (LOPRESSOR) 25 MG tablet Take 25 mg by mouth 2 (two) times daily.   pramipexole (MIRAPEX) 0.5 MG tablet Take 0.5 mg by mouth at bedtime.   pregabalin (LYRICA) 100 MG capsule Take 1 capsule (100 mg total) by mouth 3 (three) times daily.   promethazine (PHENERGAN) 12.5 MG tablet Take 12.5 mg by mouth.   QUEtiapine (SEROQUEL) 300 MG tablet TAKE ONE TABLET BY MOUTH AT BEDTIME.   QUEtiapine (SEROQUEL) 300 MG tablet Take 1 tablet (300 mg total) by mouth at bedtime.   TRADJENTA 5 MG TABS tablet Take 5 mg by mouth daily.   zinc sulfate 220 (50 Zn) MG capsule Take 220 mg by mouth at bedtime.    zolpidem (AMBIEN) 5 MG tablet Take 1 tablet (5 mg total) by mouth at bedtime.   Immunization History  Administered Date(s) Administered   Covid-19, Mrna,Vaccine(Spikevax)69yrs and older 03/22/2022   Influenza, Seasonal, Injecte, Preservative Fre 02/04/2005   Influenza,inj,Quad PF,6+ Mos 04/14/2015, 03/23/2017, 02/07/2018, 02/01/2019, 02/07/2020, 02/08/2021, 03/22/2022   Influenza-Unspecified 02/02/2019   PFIZER(Purple  Top)SARS-COV-2 Vaccination 03/18/2020, 04/08/2020, 06/03/2020   Pfizer Covid-19 Vaccine Bivalent Booster 66yrs & up 07/13/2021   Pneumococcal Conjugate-13 04/22/2014   Pneumococcal Polysaccharide-23 03/23/2017, 04/16/2019       Objective:   Physical Exam BP 124/80 (BP Location: Left Wrist, Cuff Size: Large)   Pulse 69   Temp 98 F (36.7 C)   Ht 5' 6.75" (1.695 m)   Wt (!) 323 lb 9.6 oz (146.8 kg)   LMP 04/20/2001   SpO2 95%   BMI 51.06 kg/m   SpO2: 95 % O2 Device: Nasal cannula O2 Flow Rate (L/min): 3 L/min  GENERAL: Morbidly obese woman, presents fully ambulatory today! No respiratory distress.  Comfortable with nasal cannula O2 oxygen on.  No conversational dyspnea. HEAD: Normocephalic, atraumatic. EYES: Pupils equal, round, reactive to light.  No scleral icterus. MOUTH: Nose/mouth/throat not examined due to institutional masking requirements. NECK: Supple. No thyromegaly. Trachea midline. No JVD.  No adenopathy. PULMONARY: Good air entry bilaterally.  She has faint end expiratory wheezes throughout.   CARDIOVASCULAR: S1 and S2. Regular rate and rhythm.  Grade 1/6 to 2/6 systolic ejection murmur left sternal border. ABDOMEN: Obese, soft, nondistended. MUSCULOSKELETAL: RA changes both hands.  No clubbing or edema noted, no increased warmth in the lower extremities. Chronic stasis changes but no overt edema. NEUROLOGIC: No overt focal deficit, speech is fluent. SKIN: Intact,warm,dry.  Chronic stasis changes lower extremities.   PSYCH: Mood and behavior normal.     Assessment & Plan:     ICD-10-CM   1. Chronic asthmatic bronchitis  J44.89    Well compensated on Breztri 2 puffs twice a day Continue as needed albuterol    2. Chronic respiratory failure with hypoxia (HCC)  J96.11    Compliant with oxygen at 2 L/min at rest and 3 L/min with ambulation Continue same    3. Pulmonary fibrosis, postinflammatory (HCC)  J84.10    Post COVID-19 pneumonia No evidence of  progression clinically    4. SOB (shortness of breath)  R06.02    Notes improvement with Breztri Notes improvement with rehab Continue pulmonary rehab    5. Rheumatoid arthritis  M06.9    This issue adds complexity to her management Follows with rheumatology On Plaquenil    6. 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  Renold Don, MD Advanced Bronchoscopy PCCM [facility]    *This note was dictated using voice recognition software/Dragon.  Despite best efforts to proofread, errors can occur which can change the meaning. Any transcriptional errors that result from this process are unintentional and may not be fully corrected at the time of dictation.

## 2022-05-20 ENCOUNTER — Encounter: Payer: Self-pay | Admitting: Student in an Organized Health Care Education/Training Program

## 2022-05-20 ENCOUNTER — Ambulatory Visit
Payer: Medicaid Other | Attending: Student in an Organized Health Care Education/Training Program | Admitting: Student in an Organized Health Care Education/Training Program

## 2022-05-20 ENCOUNTER — Ambulatory Visit: Admit: 2022-05-20 | Discharge: 2022-05-21 | Payer: PRIVATE HEALTH INSURANCE

## 2022-05-20 VITALS — BP 113/77 | HR 64 | Temp 97.2°F | Resp 16 | Ht 66.0 in | Wt 323.0 lb

## 2022-05-20 DIAGNOSIS — L97521 Non-pressure chronic ulcer of other part of left foot limited to breakdown of skin: Principal | ICD-10-CM

## 2022-05-20 DIAGNOSIS — E11621 Type 2 diabetes mellitus with foot ulcer: Principal | ICD-10-CM

## 2022-05-20 DIAGNOSIS — G894 Chronic pain syndrome: Secondary | ICD-10-CM | POA: Insufficient documentation

## 2022-05-20 DIAGNOSIS — E1142 Type 2 diabetes mellitus with diabetic polyneuropathy: Secondary | ICD-10-CM

## 2022-05-20 DIAGNOSIS — M792 Neuralgia and neuritis, unspecified: Secondary | ICD-10-CM | POA: Diagnosis present

## 2022-05-20 DIAGNOSIS — G8929 Other chronic pain: Secondary | ICD-10-CM | POA: Diagnosis present

## 2022-05-20 DIAGNOSIS — M79673 Pain in unspecified foot: Secondary | ICD-10-CM

## 2022-05-20 DIAGNOSIS — M059 Rheumatoid arthritis with rheumatoid factor, unspecified: Secondary | ICD-10-CM | POA: Diagnosis present

## 2022-05-20 MED ORDER — BUPRENORPHINE 10 MCG/HR TD PTWK: 1.0000 | MEDICATED_PATCH | TRANSDERMAL | 4 refills | Status: AC

## 2022-05-20 MED ORDER — PREGABALIN 100 MG PO CAPS: 100.0000 mg | ORAL_CAPSULE | Freq: Three times a day (TID) | ORAL | 5 refills | Status: AC

## 2022-05-20 MED ORDER — LEVOFLOXACIN 750 MG TABLET
ORAL_TABLET | Freq: Every day | ORAL | 0 refills | 5 days | Status: CP
Start: 2022-05-20 — End: 2022-05-25

## 2022-05-20 MED ORDER — PRAMIPEXOLE 0.125 MG TABLET
ORAL_TABLET | 2 refills | 0 days
Start: 2022-05-20 — End: ?

## 2022-05-20 NOTE — Progress Notes (Signed)
Nursing Pain Medication Assessment:  Safety precautions to be maintained throughout the outpatient stay will include: orient to surroundings, keep bed in low position, maintain call bell within reach at all times, provide assistance with transfer out of bed and ambulation.  Medication Inspection Compliance: Ms. Kinkade did not comply with our request to bring her pills to be counted. She was reminded that bringing the medication bottles, even when empty, is a requirement.  Medication: None brought in. Pill/Patch Count: None available to be counted. Bottle Appearance: No container available. Did not bring bottle(s) to appointment. Filled Date: N/A Last Medication intake:  Ran out of medicine more than 48 hours ago

## 2022-05-20 NOTE — Progress Notes (Signed)
PROVIDER NOTE: Information contained herein reflects review and annotations entered in association with encounter. Interpretation of such information and data should be left to medically-trained personnel. Information provided to patient can be located elsewhere in the medical record under "Patient Instructions". Document created using STT-dictation technology, any transcriptional errors that may result from process are unintentional.    Patient: [name] [name]  Service Category: E/M  Provider: Gillis Santa, MD  DOB: 1961-02-02  DOS: 05/20/2022  Specialty: Interventional Pain Management  MRN: [id number]  Setting: Ambulatory outpatient  PCP: Verita Lamb, NP  Type: Established Patient    Referring Provider: Verita Lamb, NP  Location: Office  Delivery: Face-to-face     HPI  [name], a 62 y.o. year old female, is here today because of her Diabetic peripheral neuropathy ([place]) [E11.42]. [name] primary complain today is low back and bilateral leg pain Last encounter: My last encounter with her was on 02/04/22 Pertinent problems: [name] has Long term current use of opiate analgesic; Diabetic peripheral neuropathy ([place]); Osteoarthritis, multiple sites; Chronic pain syndrome; Rheumatoid arthritis involving multiple sites with positive rheumatoid factor ([place]); CKD (chronic kidney disease), stage III ([place]); and Morbid obesity with BMI of 50.0-59.9, adult ([place]) on their pertinent problem list. Pain Assessment: Severity of Chronic pain is reported as a 7 /10. Location: Back (all over joint pain) Other (Comment), Lower, Left, Right/into hips and legs. Onset: More than a month ago. Quality: Discomfort, Constant. Timing: Constant. Modifying factor(s): nothing currently. Vitals:  height is 5\' 6"  (1.676 m) and weight is 323 lb (146.5 kg) (abnormal). Her temporal temperature is 97.2 F (36.2 C) (abnormal). Her blood pressure is 113/77 and her pulse is 64. Her respiration is 16 and oxygen  saturation is 98%.   Reason for encounter: medication management.    Patient presents today for medication management.  She was hospitalized towards the end of last year for diabetic gastroparesis.  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  She saw her pulmonologist Dr. Patsey Berthold on 04/21/2022.  She is engaging in cardiac and pulmonary rehab.  I advised against any control to opioids given the risk of respiratory depression and increased risk of decreased gastric peristalsis and motility.  She was noticing some benefit with Butrans patch at 7.5 mcg an hour, will increase to 10 mcg an hour below.  I will also refill her Lyrica for diabetic neuropathic pain.  Pharmacotherapy Assessment  Analgesic:  Butrans 10 mcg/hr     Monitoring: [place] PMP: PDMP reviewed during this encounter.       Pharmacotherapy: No side-effects or adverse reactions reported. Compliance: No problems identified. Effectiveness: Clinically acceptable.  UDS:  Summary  Date Value Ref Range Status  09/16/2020 Note  Final    Comment:    ==================================================================== [employer] Select 13 (MW) ==================================================================== Test                             Result       Flag       Units  Drug Present and Declared for Prescription Verification   Oxycodone                      615          EXPECTED   ng/mg creat   Noroxycodone  587          EXPECTED   ng/mg creat    Sources of oxycodone include scheduled prescription medications.    Noroxycodone is an expected metabolite of oxycodone.  Drug Absent but Declared for Prescription Verification   Alprazolam                     Not Detected UNEXPECTED ng/mg creat ==================================================================== Test                      Result    Flag   Units      Ref Range   Creatinine              139               mg/dL      >=20 ==================================================================== Declared Medications:  The flagging and interpretation on this report are based on the  following declared medications.  Unexpected results may arise from  inaccuracies in the declared medications.   **Note: The testing scope of this panel includes these medications:   Alprazolam (Xanax)  Oxycodone (Percocet)   **Note: The testing scope of this panel does not include the  following reported medications:   Acetaminophen (Tylenol)  Acetaminophen (Percocet)  Albuterol (Duoneb)  Apixaban (Eliquis)  Aripiprazole (Abilify)  Atorvastatin  Budesonide (Pulmicort)  Calcium  Cholecalciferol  Fluoxetine (Prozac)  Folic Acid  Furosemide  Insulin (Humalog)  Ipratropium (Duoneb)  Levothyroxine  Linagliptin (Tradjenta)  Metformin  Mupirocin  Polyethylene Glycol (MiraLAX)  Pramipexole (Mirapex)  Pregabalin (Lyrica)  Quetiapine (Seroquel)  Solifenacin (Vesicare)  Vancomycin  Vitamin C  Vitamin D  Zinc  Zolpidem (Ambien) ==================================================================== For clinical consultation, please call [phone]. ====================================================================       ROS  Constitutional:  Intermittent shortness of breath Gastrointestinal: No reported hemesis, hematochezia, vomiting, or acute GI distress Musculoskeletal:  Low back pain, left ankle pain, bilateral shoulder pain right greater than left Neurological: No reported episodes of acute onset apraxia, aphasia, dysarthria, agnosia, amnesia, paralysis, loss of coordination, or loss of consciousness  Medication Review  ARIPiprazole, Budeson-Glycopyrrol-Formoterol, Calcium Citrate-Vitamin D3, FLUoxetine, QUEtiapine, Vitamin D, acetaminophen, amLODipine, apixaban, ascorbic acid, atorvastatin, buprenorphine, busPIRone, famotidine, folic acid, hydroxychloroquine, levothyroxine, linagliptin,  metFORMIN, methotrexate, metoCLOPramide, metoprolol tartrate, pramipexole, pregabalin, promethazine, zinc sulfate, and zolpidem  History Review  Allergy: Ms. Rump is allergic to cephalexin, codeine, doxycycline, propoxyphene, sulfa antibiotics, clindamycin, lovenox [enoxaparin sodium], hydrocodone, and meropenem. Drug: Ms. Flott  reports no history of drug use. Alcohol:  reports no history of alcohol use. Tobacco:  reports that she quit smoking about 22 years ago. Her smoking use included cigarettes. She has a 54.00 pack-year smoking history. She has never used smokeless tobacco. Social: Ms. Severin  reports that she quit smoking about 22 years ago. Her smoking use included cigarettes. She has a 54.00 pack-year smoking history. She has never used smokeless tobacco. She reports that she does not drink alcohol and does not use drugs. Medical:  has a past medical history of Abdominal pain (02/12/2019), Abdominal wall hernia (82/95/6213), Acute metabolic encephalopathy (0/86/5784), AMS (altered mental status) (12/28/2021), Anxiety, Arthritis, C. difficile colitis, Chronic diastolic heart failure ([place]), COVID-19 (03/23/2019), Depression, Diabetes mellitus, Diastolic CHF ([place]), Esophagitis, Fluid retention, GERD (gastroesophageal reflux disease), Hiatal hernia, Hypertension, Hypokalemia due to loss of potassium (10/21/2015), Hypothyroidism, IBS (irritable bowel syndrome), Moderate episode of recurrent major depressive disorder ([place]) (06/03/2004), Morbid obesity ([place]), MRSA (methicillin resistant Staphylococcus aureus) infection (11/2017), Neurogenic bladder, Neuropathy, Obesity,  Panic attacks, Pneumonia due to COVID-19 virus, Rheumatoid arthritis ([place]), and Sleep apnea. Surgical: Ms. Kaczorowski  has a past surgical history that includes Tubal ligation; Tonsillectomy; Cholecystectomy; Abdominal hysterectomy; Laparoscopic gastric banding (03/20/2007); Eye surgery; Hernia repair; DG GREAT TOE RIGHT FOOT (02/23/2018); TEE  without cardioversion (N/A, 07/16/2019); Irrigation and debridement foot (Left, 01/11/2020); and Foot surgery (Left, 05/13/2021). Family: family history includes Alcohol abuse in her father and sister; Anxiety disorder in her father, sister, and sister; Bipolar disorder in her father and sister; Depression in her father, sister, and sister; Drug abuse in her sister; Heart attack in her brother; Heart attack (age of onset: 22) in her brother; Heart disease in her brother; Heart failure in her father.  Laboratory Chemistry Profile   Renal Lab Results  Component Value Date   BUN 21 (H) 03/01/2022   CREATININE 1.24 (H) 03/01/2022   BCR 16 11/16/2018   GFRAA >60 01/15/2020   GFRNONAA 50 (L) 03/01/2022     Hepatic Lab Results  Component Value Date   AST 14 (L) 01/14/2022   ALT 10 01/14/2022   ALBUMIN 3.4 (L) 01/14/2022   ALKPHOS 67 01/14/2022   LIPASE 39 01/04/2022     Electrolytes Lab Results  Component Value Date   NA 138 03/01/2022   K 4.2 03/01/2022   CL 111 03/01/2022   CALCIUM 8.7 (L) 03/01/2022   MG 1.9 03/01/2022   PHOS 2.8 05/21/2020     Bone Lab Results  Component Value Date   VD25OH 37.36 04/30/2019   25OHVITD1 23 (L) 11/16/2018   25OHVITD2 2.2 11/16/2018   25OHVITD3 21 11/16/2018     Inflammation (CRP: Acute Phase) (ESR: Chronic Phase) Lab Results  Component Value Date   CRP 2.1 (H) 07/17/2019   ESRSEDRATE 65 (H) 07/17/2019   LATICACIDVEN 1.3 02/28/2022       Note: Above Lab results reviewed.   Vitals: BP (!) 88/76   Pulse 70   Temp (!) 97.4 F (36.3 C)   Resp 18   Ht 5\' 6"  (1.676 m)   Wt (!) 325 lb (147.4 kg)   LMP 04/20/2001   SpO2 96% Comment: 3L O2 from home  BMI 52.46 kg/m  BMI: Estimated body mass index is 52.46 kg/m as calculated from the following:   Height as of this encounter: 5\' 6"  (1.676 m).   Weight as of this encounter: 325 lb (147.4 kg). Ideal: Ideal body weight: 59.3 kg (130 lb 11.7 oz) Adjusted ideal body weight: 94.5 kg  (208 lb 7 oz)   Physical Exam  General appearance: Well nourished, well developed, and well hydrated. In no apparent acute distress Mental status: Alert, oriented x 3 (person, place, & time)       Respiratory: Oxygen-dependent COPD,  Cervical Spine Area Exam  Skin & Axial Inspection: No masses, redness, edema, swelling, or associated skin lesions Alignment: Symmetrical Functional ROM: Pain restricted ROM      Stability: No instability detected Muscle Tone/Strength: Functionally intact. No obvious neuro-muscular anomalies detected. Sensory (Neurological): Arthropathic arthralgia Palpation: No palpable anomalies             Upper Extremity (UE) Exam    Side: Right upper extremity  Side: Left upper extremity  Skin & Extremity Inspection: Skin color, temperature, and hair growth are WNL. No peripheral edema or cyanosis. No masses, redness, swelling, asymmetry, or associated skin lesions. No contractures.  Skin & Extremity Inspection: Skin color, temperature, and hair growth are WNL. No peripheral edema or cyanosis. No masses, redness,  swelling, asymmetry, or associated skin lesions. No contractures.  Functional ROM: Pain restricted ROM for shoulder and elbow  Functional ROM: Unrestricted ROM          Muscle Tone/Strength: Functionally intact. No obvious neuro-muscular anomalies detected.  Muscle Tone/Strength: Functionally intact. No obvious neuro-muscular anomalies detected.  Sensory (Neurological): Arthropathic arthralgia          Sensory (Neurological): Unimpaired          Palpation: No palpable anomalies              Palpation: No palpable anomalies              Provocative Test(s):  Phalen's test: deferred Tinel's test: deferred Apley's scratch test (touch opposite shoulder):  Action 1 (Across chest): Decreased ROM Action 2 (Overhead): Decreased ROM Action 3 (LB reach): Decreased ROM   Provocative Test(s):  Phalen's test: deferred Tinel's test: deferred Apley's scratch test (touch  opposite shoulder):  Action 1 (Across chest): deferred Action 2 (Overhead): deferred Action 3 (LB reach): deferred       Assessment   Diagnosis  1. Diabetic peripheral neuropathy (HCC)   2. Neuropathic pain   3. Neurogenic pain   4. Chronic foot pain (Primary Area of Pain) (Bilateral) (L>R)   5. Rheumatoid arthritis with positive rheumatoid factor, involving unspecified site (HCC)   6. Chronic pain syndrome        Plan of Care  Ms. TONEA LEIPHART has a current medication list which includes the following long-term medication(s): aripiprazole, calcium citrate-vitamin d3, eliquis, famotidine, fluoxetine, metoclopramide, quetiapine, zolpidem, and pregabalin.  Pharmacotherapy (Medications Ordered): Meds ordered this encounter  Medications   pregabalin (LYRICA) 100 MG capsule    Sig: Take 1 capsule (100 mg total) by mouth 3 (three) times daily.    Dispense:  90 capsule    Refill:  5    Fill one day early if pharmacy is closed on scheduled refill date. May substitute for generic if available.   buprenorphine (BUTRANS) 10 MCG/HR PTWK    Sig: Place 1 patch onto the skin once a week.    Dispense:  4 patch    Refill:  4    Chronic Pain: STOP Act (Not applicable) Fill 1 day early if closed on refill date. Avoid benzodiazepines within 8 hours of opioids    We tried to proceed with Qutenza, capsaicin topical treatment for painful diabetic neuropathy however insurance would not cover.  Follow-up plan:   Return in about 4 months (around 09/18/2022) for Medication Management, in person.   Recent Visits No visits were found meeting these conditions. Showing recent visits within past 90 days and meeting all other requirements Today's Visits Date Type Provider Dept  05/20/22 Office Visit Edward Jolly, MD [facility]  Showing today's visits and meeting all other requirements Future Appointments No visits were found meeting these conditions. Showing future appointments  within next 90 days and meeting all other requirements  I discussed the assessment and treatment plan with the patient. The patient was provided an opportunity to ask questions and all were answered. The patient agreed with the plan and demonstrated an understanding of the instructions.  Patient advised to call back or seek an in-person evaluation if the symptoms or condition worsens.  Duration of encounter: 30 minutes.  Note by: Edward Jolly, MD Date: 05/20/2022; Time: 8:44 AM

## 2022-05-20 NOTE — Progress Notes (Deleted)
Safety precautions to be maintained throughout the outpatient stay will include: orient to surroundings, keep bed in low position, maintain call bell within reach at all times, provide assistance with transfer out of bed and ambulation.  

## 2022-05-21 MED ORDER — METFORMIN ER 500 MG TABLET,EXTENDED RELEASE 24 HR
ORAL_TABLET | 11 refills | 0 days
Start: 2022-05-21 — End: ?

## 2022-05-24 ENCOUNTER — Encounter: Payer: Medicaid Other | Attending: Pulmonary Disease | Admitting: *Deleted

## 2022-05-24 DIAGNOSIS — J8410 Pulmonary fibrosis, unspecified: Secondary | ICD-10-CM

## 2022-05-24 NOTE — Progress Notes (Signed)
Daily Session Note  Patient Details  Name: [name] [name] MRN: [id number] Date of Birth: 1960-04-29 Referring Provider:   Flowsheet Row Pulmonary Rehab from 04/05/2022 in [facility] Cardiac and Pulmonary Rehab  Referring Provider Galen Daft       Encounter Date: 05/24/2022  Check In:  Session Check In - 05/24/22 1603       Check-In   Supervising physician immediately available to respond to emergencies See telemetry face sheet for immediately available ER MD    Location [facility]-Cardiac & Pulmonary Rehab    Staff Present Renita Papa, RN BSN;Laureen Owens Shark, BS, RRT, CPFT;Noah Tickle, BS, Exercise Physiologist    Virtual Visit No    Medication changes reported     No    Fall or balance concerns reported    No    Warm-up and Cool-down Performed on first and last piece of equipment    Resistance Training Performed Yes    VAD Patient? No    PAD/SET Patient? No      Pain Assessment   Currently in Pain? No/denies                Social History   Tobacco Use  Smoking Status Former   Packs/day: 2.00   Years: 27.00   Total pack years: 54.00   Types: Cigarettes   Quit date: 07/30/1999   Years since quitting: 22.8  Smokeless Tobacco Never  Tobacco Comments   quit in 2001 2-2.5 a day    Goals Met:  Independence with exercise equipment Exercise tolerated well No report of concerns or symptoms today Strength training completed today  Goals Unmet:  Not Applicable  Comments: Pt able to follow exercise prescription today without complaint.  Will continue to monitor for progression.    Dr. Emily Filbert is Medical Director for [name].  Dr. Ottie Glazier is Medical Director for [name].

## 2022-05-26 ENCOUNTER — Encounter: Payer: Medicaid Other | Admitting: *Deleted

## 2022-05-26 DIAGNOSIS — J8410 Pulmonary fibrosis, unspecified: Secondary | ICD-10-CM

## 2022-05-26 NOTE — Progress Notes (Signed)
Daily Session Note  Patient Details  Name: [name] [name] MRN: [id number] Date of Birth: 1960-12-24 Referring Provider:   Flowsheet Row Pulmonary Rehab from 04/05/2022 in [facility] Cardiac and Pulmonary Rehab  Referring Provider Galen Daft       Encounter Date: 05/26/2022  Check In:  Session Check In - 05/26/22 1606       Check-In   Supervising physician immediately available to respond to emergencies See telemetry face sheet for immediately available ER MD    Location [facility]-Cardiac & Pulmonary Rehab    Staff Present Renita Papa, RN Odelia Gage, RN, Doyce Para, BS, ACSM CEP, Exercise Physiologist    Virtual Visit No    Medication changes reported     No    Fall or balance concerns reported    No    Warm-up and Cool-down Performed on first and last piece of equipment    Resistance Training Performed Yes    VAD Patient? No    PAD/SET Patient? No      Pain Assessment   Currently in Pain? No/denies                Social History   Tobacco Use  Smoking Status Former   Packs/day: 2.00   Years: 27.00   Total pack years: 54.00   Types: Cigarettes   Quit date: 07/30/1999   Years since quitting: 22.8  Smokeless Tobacco Never  Tobacco Comments   quit in 2001 2-2.5 a day    Goals Met:  Independence with exercise equipment Exercise tolerated well No report of concerns or symptoms today Strength training completed today  Goals Unmet:  Not Applicable  Comments: Pt able to follow exercise prescription today without complaint.  Will continue to monitor for progression.    Dr. Emily Filbert is Medical Director for [place].  Dr. Ottie Glazier is Medical Director for [facility] Pulmonary Rehabilitation.

## 2022-05-28 DIAGNOSIS — R110 Nausea: Principal | ICD-10-CM

## 2022-05-28 MED ORDER — TRADJENTA 5 MG TABLET
ORAL_TABLET | Freq: Every day | ORAL | 3 refills | 90 days
Start: 2022-05-28 — End: 2023-05-28

## 2022-05-28 MED ORDER — APIXABAN 5 MG TABLET
ORAL_TABLET | Freq: Two times a day (BID) | ORAL | 3 refills | 90 days
Start: 2022-05-28 — End: 2023-05-28

## 2022-05-28 MED ORDER — PROMETHAZINE 12.5 MG TABLET
ORAL_TABLET | Freq: Four times a day (QID) | ORAL | 3 refills | 10 days | PRN
Start: 2022-05-28 — End: 2023-05-28

## 2022-05-28 MED ORDER — PRAMIPEXOLE 0.125 MG TABLET
ORAL_TABLET | Freq: Every evening | ORAL | 2 refills | 7 days
Start: 2022-05-28 — End: 2023-05-28

## 2022-05-31 ENCOUNTER — Encounter: Payer: Medicaid Other | Admitting: *Deleted

## 2022-05-31 DIAGNOSIS — J8410 Pulmonary fibrosis, unspecified: Secondary | ICD-10-CM | POA: Diagnosis not present

## 2022-05-31 MED ORDER — PROMETHAZINE 12.5 MG TABLET
ORAL_TABLET | Freq: Four times a day (QID) | ORAL | 3 refills | 10 days | Status: CP | PRN
Start: 2022-05-31 — End: 2023-05-31

## 2022-05-31 NOTE — Progress Notes (Signed)
Daily Session Note  Patient Details  Name: [name] [name] MRN: [id number] Date of Birth: [birth year] Referring Provider:   Flowsheet Row Pulmonary Rehab from 04/05/2022 in [facility] Card[facility] Rehab  Referring Provider Galen Daft       Encounter Date: 05/31/2022  Check In:  Session Check In - 05/31/22 1558       Check-In   Supervising physician immediately available to respond to emergencies See telemetry face sheet for immediately available ER MD    Location [facility]-Cardiac & Pulmonary Rehab    Staff Present Renita Papa, RN BSN;Joseph [place], RCP,RRT,BSRT;Jessica [place], [place], RCEP, CCRP, CCET    Virtual Visit No    Medication changes reported     No    Fall or balance concerns reported    No    Warm-up and Cool-down Performed on first and last piece of equipment    Resistance Training Performed Yes    VAD Patient? No    PAD/SET Patient? No      Pain Assessment   Currently in Pain? No/denies                Social History   Tobacco Use  Smoking Status Former   Packs/day: 2.00   Years: 27.00   Total pack years: 54.00   Types: Cigarettes   Quit date: 07/30/1999   Years since quitting: 22.8  Smokeless Tobacco Never  Tobacco Comments   quit in 2001 2-2.5 a day    Goals Met:  Independence with exercise equipment Exercise tolerated well No report of concerns or symptoms today Strength training completed today  Goals Unmet:  Not Applicable  Comments: Pt able to follow exercise prescription today without complaint.  Will continue to monitor for progression.    Dr. Emily Filbert is Medical Director for [place].  Dr. Ottie Glazier is Medical Director for [facility] Pulmonary Rehabilitation.

## 2022-06-01 MED ORDER — PRAMIPEXOLE 0.125 MG TABLET
ORAL_TABLET | 2 refills | 0 days
Start: 2022-06-01 — End: ?

## 2022-06-02 ENCOUNTER — Ambulatory Visit: Admit: 2022-06-02 | Discharge: 2022-06-03 | Payer: PRIVATE HEALTH INSURANCE | Attending: Family | Primary: Family

## 2022-06-02 DIAGNOSIS — E1165 Type 2 diabetes mellitus with hyperglycemia: Principal | ICD-10-CM

## 2022-06-02 DIAGNOSIS — E782 Mixed hyperlipidemia: Principal | ICD-10-CM

## 2022-06-02 DIAGNOSIS — E039 Hypothyroidism, unspecified: Principal | ICD-10-CM

## 2022-06-02 DIAGNOSIS — Z23 Encounter for immunization: Principal | ICD-10-CM

## 2022-06-02 DIAGNOSIS — Z794 Long term (current) use of insulin: Principal | ICD-10-CM

## 2022-06-02 DIAGNOSIS — J449 Chronic obstructive pulmonary disease, unspecified: Principal | ICD-10-CM

## 2022-06-02 MED ORDER — APIXABAN 5 MG TABLET
ORAL_TABLET | Freq: Two times a day (BID) | ORAL | 3 refills | 90 days | Status: CP
Start: 2022-06-02 — End: 2023-06-02

## 2022-06-02 MED ORDER — TRADJENTA 5 MG TABLET
ORAL_TABLET | Freq: Every day | ORAL | 3 refills | 90 days | Status: CP
Start: 2022-06-02 — End: 2023-06-02

## 2022-06-03 ENCOUNTER — Ambulatory Visit: Payer: Medicaid Other | Admitting: *Deleted

## 2022-06-07 DIAGNOSIS — E782 Mixed hyperlipidemia: Principal | ICD-10-CM

## 2022-06-07 MED ORDER — ATORVASTATIN 80 MG TABLET
ORAL_TABLET | Freq: Every evening | ORAL | 1 refills | 90 days | Status: CP
Start: 2022-06-07 — End: 2023-06-07

## 2022-06-09 ENCOUNTER — Encounter: Payer: Medicaid Other | Admitting: *Deleted

## 2022-06-09 ENCOUNTER — Encounter: Payer: Self-pay | Admitting: *Deleted

## 2022-06-09 DIAGNOSIS — J8410 Pulmonary fibrosis, unspecified: Secondary | ICD-10-CM

## 2022-06-09 NOTE — Progress Notes (Signed)
Pulmonary Individual Treatment Plan  Patient Details  Name: [name] [name] MRN: [id number] Date of Birth: Aug 23, 1960 Referring Provider:   Flowsheet Row Pulmonary Rehab from 04/05/2022 in [facility] Cardiac and Pulmonary Rehab  Referring Provider Galen Daft       Initial Encounter Date:  Flowsheet Row Pulmonary Rehab from 04/05/2022 in [facility] Cardiac and Pulmonary Rehab  Date 04/05/22       Visit Diagnosis: Pulmonary fibrosis ([place])  Patient's Home Medications on Admission:  Current Outpatient Medications:    acetaminophen (TYLENOL) 325 MG tablet, Take 2 tablets (650 mg total) by mouth every 6 (six) hours as needed for mild pain or moderate pain (or Fever >/= 101)., Disp: 20 tablet, Rfl: 0   amLODipine (NORVASC) 10 MG tablet, Take 10 mg by mouth daily., Disp: , Rfl:    ARIPiprazole (ABILIFY) 5 MG tablet, Take 1 tablet (5 mg total) by mouth daily., Disp: 30 tablet, Rfl: 5   ascorbic acid (VITAMIN C) 500 MG tablet, Take 1 tablet (500 mg total) by mouth daily., Disp: 30 tablet, Rfl: 0   atorvastatin (LIPITOR) 80 MG tablet, Take 80 mg by mouth daily., Disp: , Rfl:    Budeson-Glycopyrrol-Formoterol (BREZTRI AEROSPHERE) 160-9-4.8 MCG/ACT AERO, Inhale 2 puffs into the lungs in the morning and at bedtime., Disp: 5.9 g, Rfl: 0   buprenorphine (BUTRANS) 10 MCG/HR PTWK, Place 1 patch onto the skin once a week., Disp: 4 patch, Rfl: 4   busPIRone (BUSPAR) 10 MG tablet, Take 1 tablet (10 mg total) by mouth 2 (two) times daily., Disp: 60 tablet, Rfl: 5   Calcium Citrate-Vitamin D3 (GNP CALCIUM CITRATE+D MAXIMUM) 315-6.25 MG-MCG TABS, Take 1 tablet by mouth daily., Disp: , Rfl:    Cholecalciferol (VITAMIN D) 125 MCG (5000 UT) CAPS, Take 1 capsule by mouth daily., Disp: , Rfl:    ELIQUIS 5 MG TABS tablet, TAKE 1 TABLET BY MOUTH TWICE DAILY (Patient taking differently: Take 5 mg by mouth 2 (two) times daily.), Disp: 56 tablet, Rfl: 0   famotidine (PEPCID) 20 MG tablet, Take 1 tablet (20 mg total) by mouth  daily., Disp: 30 tablet, Rfl: 0   FLUoxetine (PROZAC) 40 MG capsule, Take 1 capsule (40 mg total) by mouth daily., Disp: 30 capsule, Rfl: 5   folic acid (FOLVITE) 1 MG tablet, Take 1 mg by mouth daily. , Disp: , Rfl:    hydroxychloroquine (PLAQUENIL) 200 MG tablet, Take 200 mg by mouth 2 (two) times daily., Disp: , Rfl:    levothyroxine (SYNTHROID, LEVOTHROID) 88 MCG tablet, Take 88 mcg by mouth daily before breakfast., Disp: , Rfl:    metFORMIN (GLUCOPHAGE-XR) 500 MG 24 hr tablet, Take 500 mg by mouth daily., Disp: , Rfl:    methotrexate (RHEUMATREX) 2.5 MG tablet, Take 2.5 mg by mouth once a week., Disp: , Rfl:    metoCLOPramide (REGLAN) 5 MG/5ML solution, Take 5 mg by mouth 4 (four) times daily -  before meals and at bedtime., Disp: , Rfl:    metoprolol tartrate (LOPRESSOR) 25 MG tablet, Take 25 mg by mouth 2 (two) times daily., Disp: , Rfl:    pramipexole (MIRAPEX) 0.5 MG tablet, Take 0.5 mg by mouth at bedtime., Disp: , Rfl:    pregabalin (LYRICA) 100 MG capsule, Take 1 capsule (100 mg total) by mouth 3 (three) times daily., Disp: 90 capsule, Rfl: 5   promethazine (PHENERGAN) 12.5 MG tablet, Take 12.5 mg by mouth., Disp: , Rfl:    QUEtiapine (SEROQUEL) 300 MG tablet, TAKE ONE TABLET BY MOUTH  AT BEDTIME., Disp: 30 tablet, Rfl: 5   TRADJENTA 5 MG TABS tablet, Take 5 mg by mouth daily., Disp: , Rfl:    zinc sulfate 220 (50 Zn) MG capsule, Take 220 mg by mouth at bedtime. , Disp: , Rfl:    zolpidem (AMBIEN) 5 MG tablet, Take 1 tablet (5 mg total) by mouth at bedtime., Disp: 30 tablet, Rfl: 5  Past Medical History: Past Medical History:  Diagnosis Date   Abdominal pain 02/12/2019   Abdominal wall hernia [id number]   Acute metabolic encephalopathy [id number]   AMS (altered mental status) 12/28/2021   Anxiety    Arthritis    Rheumatoid   C. difficile colitis    Chronic diastolic heart failure ([place])    COVID-19 03/23/2019   Diagnosed at [facility] (send-out) on 03/23/2019   Depression    Diabetes  mellitus    states no meds or diet restrictions  at present   Diastolic CHF (HCC)    Esophagitis    Fluid retention    GERD (gastroesophageal reflux disease)    Hiatal hernia    Hypertension    Hypokalemia due to loss of potassium 10/21/2015   Overview:  Associated with 3 weeks of diarrhea  And QT prolongation.   Hypothyroidism    IBS (irritable bowel syndrome)    Moderate episode of recurrent major depressive disorder ([place]) 06/03/2004   Morbid obesity (HCC)    MRSA (methicillin resistant Staphylococcus aureus) infection 11/2017   left inner thigh abcess   Neurogenic bladder    has pacemaker   Neuropathy    Obesity    Panic attacks    Pneumonia due to COVID-19 virus    Rheumatoid arthritis ([place])    Sleep apnea    STATES SEVERE, CANT TOLERATE MASK- LAST STUDY YEARS AGO    Tobacco Use: Social History   Tobacco Use  Smoking Status Former   Packs/day: 2.00   Years: 27.00   Total pack years: 54.00   Types: Cigarettes   Quit date: 07/30/1999   Years since quitting: 22.8  Smokeless Tobacco Never  Tobacco Comments   quit in 2001 2-2.5 a day    Labs: Review Flowsheet  More data exists      Latest Ref Rng & Units 07/09/2019 07/10/2019 01/10/2020 12/27/2021 01/04/2022  Labs for ITP Cardiac and Pulmonary Rehab  Hemoglobin A1c 4.8 - 5.6 % - - 10.8  - 6.8   PH, Arterial 7.350 - 7.450 7.38  7.48  - - -  PCO2 arterial 32.0 - 48.0 mmHg 36  25  - - -  Bicarbonate 20.0 - 28.0 mmol/L 21.3  18.6  - 27.2  -  Acid-base deficit 0.0 - 2.0 mmol/L 3.3  3.3  - - -  O2 Saturation % 96.6  97.3  - 65.4  -     Pulmonary Assessment Scores:  Pulmonary Assessment Scores     Row Name 04/05/22 1713         ADL UCSD   ADL Phase Entry     SOB Score total 81     Rest 2     Walk 3     Stairs 5     Bath 4     Dress 3     Shop 3       CAT Score   CAT Score 22       mMRC Score   mMRC Score 2              UCSD: Self-administered  rating of dyspnea associated with activities of daily  living (ADLs) 6-point scale (0 = "not at all" to 5 = "maximal or unable to do because of breathlessness")  Scoring Scores range from 0 to 120.  Minimally important difference is 5 units  CAT: CAT can identify the health impairment of COPD patients and is better correlated with disease progression.  CAT has a scoring range of zero to 40. The CAT score is classified into four groups of low (less than 10), medium (10 - 20), high (21-30) and very high (31-40) based on the impact level of disease on health status. A CAT score over 10 suggests significant symptoms.  A worsening CAT score could be explained by an exacerbation, poor medication adherence, poor inhaler technique, or progression of COPD or comorbid conditions.  CAT MCID is 2 points  mMRC: mMRC (Modified Medical Research Council) Dyspnea Scale is used to assess the degree of baseline functional disability in patients of respiratory disease due to dyspnea. No minimal important difference is established. A decrease in score of 1 point or greater is considered a positive change.   Pulmonary Function Assessment:  Pulmonary Function Assessment - 03/31/22 1422       Breath   Shortness of Breath Yes;Limiting activity;Panic with Shortness of Breath             Exercise Target Goals: Exercise Program Goal: Individual exercise prescription set using results from initial 6 min walk test and THRR while considering  patient's activity barriers and safety.   Exercise Prescription Goal: Initial exercise prescription builds to 30-45 minutes a day of aerobic activity, 2-3 days per week.  Home exercise guidelines will be given to patient during program as part of exercise prescription that the participant will acknowledge.  Education: Aerobic Exercise: - Group verbal and visual presentation on the components of exercise prescription. Introduces F.I.T.T principle from ACSM for exercise prescriptions.  Reviews F.I.T.T. principles of aerobic  exercise including progression. Written material given at graduation.   Education: Resistance Exercise: - Group verbal and visual presentation on the components of exercise prescription. Introduces F.I.T.T principle from ACSM for exercise prescriptions  Reviews F.I.T.T. principles of resistance exercise including progression. Written material given at graduation.    Education: Exercise & Equipment Safety: - Individual verbal instruction and demonstration of equipment use and safety with use of the equipment. Flowsheet Row Pulmonary Rehab from 04/05/2022 in [facility] Cardiac and Pulmonary Rehab  Date 04/05/22  Educator [facility]  Instruction Review Code 1- Verbalizes Understanding       Education: Exercise Physiology & General Exercise Guidelines: - Group verbal and written instruction with models to review the exercise physiology of the cardiovascular system and associated critical values. Provides general exercise guidelines with specific guidelines to those with heart or lung disease.    Education: Flexibility, Balance, Mind/Body Relaxation: - Group verbal and visual presentation with interactive activity on the components of exercise prescription. Introduces F.I.T.T principle from ACSM for exercise prescriptions. Reviews F.I.T.T. principles of flexibility and balance exercise training including progression. Also discusses the mind body connection.  Reviews various relaxation techniques to help reduce and manage stress (i.e. Deep breathing, progressive muscle relaxation, and visualization). Balance handout provided to take home. Written material given at graduation.   Activity Barriers & Risk Stratification:  Activity Barriers & Cardiac Risk Stratification - 04/05/22 1702       Activity Barriers & Cardiac Risk Stratification   Activity Barriers Shortness of Breath;History of Falls;Deconditioning;Assistive Device;Balance Concerns;Joint Problems;Arthritis  6 Minute Walk:  6  Minute Walk     Row Name 04/05/22 1659         6 Minute Walk   Phase Initial     Distance 530 feet     Walk Time 6 minutes     # of Rest Breaks 0     MPH 1     METS 1     RPE 15     Perceived Dyspnea  3     VO2 Peak 2     Symptoms Yes (comment)     Comments SOB     Resting HR 62 bpm     Resting BP 120/70     Resting Oxygen Saturation  95 %     Exercise Oxygen Saturation  during 6 min walk 91 %     Max Ex. HR 77 bpm     Max Ex. BP 130/70     2 Minute Post BP 118/82       Interval HR   1 Minute HR 69     2 Minute HR 75     3 Minute HR 77     4 Minute HR 77     5 Minute HR 77     6 Minute HR 73     2 Minute Post HR 69     Interval Heart Rate? Yes       Interval Oxygen   Interval Oxygen? Yes     Baseline Oxygen Saturation % 95 %     1 Minute Oxygen Saturation % 95 %     1 Minute Liters of Oxygen 3 L     2 Minute Oxygen Saturation % 91 %     2 Minute Liters of Oxygen 3 L     3 Minute Oxygen Saturation % 91 %     3 Minute Liters of Oxygen 3 L     4 Minute Oxygen Saturation % 92 %     4 Minute Liters of Oxygen 3 L     5 Minute Oxygen Saturation % 91 %     5 Minute Liters of Oxygen 3 L     6 Minute Oxygen Saturation % 92 %     6 Minute Liters of Oxygen 3 L     2 Minute Post Oxygen Saturation % 97 %     2 Minute Post Liters of Oxygen 3 L             Oxygen Initial Assessment:  Oxygen Initial Assessment - 03/31/22 1421       Home Oxygen   Home Oxygen Device Home Concentrator;E-Tanks;Portable Concentrator    Sleep Oxygen Prescription Continuous    Liters per minute 3    Home Exercise Oxygen Prescription Continuous    Liters per minute 3    Home Resting Oxygen Prescription Continuous    Liters per minute 3    Compliance with Home Oxygen Use Yes      Initial 6 min Walk   Oxygen Used Continuous    Liters per minute 3      Program Oxygen Prescription   Program Oxygen Prescription Continuous    Liters per minute 3      Intervention   Short Term Goals  To learn and exhibit compliance with exercise, home and travel O2 prescription;To learn and understand importance of maintaining oxygen saturations>88%;To learn and demonstrate proper use of respiratory medications;To learn and understand importance of monitoring SPO2 with pulse oximeter and  demonstrate accurate use of the pulse oximeter.;To learn and demonstrate proper pursed lip breathing techniques or other breathing techniques.     Long  Term Goals Verbalizes importance of monitoring SPO2 with pulse oximeter and return demonstration;Exhibits proper breathing techniques, such as pursed lip breathing or other method taught during program session;Demonstrates proper use of MDI's;Compliance with respiratory medication;Maintenance of O2 saturations>88%;Exhibits compliance with exercise, home  and travel O2 prescription             Oxygen Re-Evaluation:  Oxygen Re-Evaluation     Row Name 04/07/22 1626 05/05/22 1619 05/24/22 1606         Program Oxygen Prescription   Program Oxygen Prescription -- Continuous Continuous     Liters per minute -- 3 3       Home Oxygen   Home Oxygen Device -- Home Concentrator;E-Tanks;Portable Concentrator Home Concentrator;E-Tanks;Portable Concentrator     Sleep Oxygen Prescription -- Continuous Continuous     Liters per minute -- 3 3     Home Exercise Oxygen Prescription -- Continuous Continuous     Liters per minute -- 3 3     Home Resting Oxygen Prescription -- Continuous Continuous     Liters per minute -- 3 3     Compliance with Home Oxygen Use -- Yes Yes       Goals/Expected Outcomes   Short Term Goals To learn and exhibit compliance with exercise, home and travel O2 prescription;To learn and understand importance of maintaining oxygen saturations>88%;To learn and demonstrate proper use of respiratory medications;To learn and understand importance of monitoring SPO2 with pulse oximeter and demonstrate accurate use of the pulse oximeter.;To learn and  demonstrate proper pursed lip breathing techniques or other breathing techniques.  To learn and understand importance of maintaining oxygen saturations>88%;To learn and understand importance of monitoring SPO2 with pulse oximeter and demonstrate accurate use of the pulse oximeter. Other     Long  Term Goals Verbalizes importance of monitoring SPO2 with pulse oximeter and return demonstration;Exhibits proper breathing techniques, such as pursed lip breathing or other method taught during program session;Demonstrates proper use of MDI's;Compliance with respiratory medication;Maintenance of O2 saturations>88%;Exhibits compliance with exercise, home  and travel O2 prescription Maintenance of O2 saturations>88%;Verbalizes importance of monitoring SPO2 with pulse oximeter and return demonstration Other     Comments Reviewed PLB technique with pt.  Talked about how it works and it's importance in maintaining their exercise saturations. She has a pulse oximeter to check her/his oxygen saturation at home. Informed and explained why it is important to have one. Reviewed that oxygen saturations should be 88 percent and above. Patient verbalizes understanding. [name] [name] has been checking her oxygen at home and she states it been in the 90s range. She has been practicing PLB to help with her breathing. She has no questions about her respiratory medications.     Goals/Expected Outcomes Short: Become more profiecient at using PLB.   Long: Become independent at using PLB. Short: monitor oxygen at home with exertion. Long: maintain oxygen saturations above 88 percent independently. Short: continue to use PLB. Long: maintain oxygen and PLB independently.              Oxygen Discharge (Final Oxygen Re-Evaluation):  Oxygen Re-Evaluation - 05/24/22 1606       Program Oxygen Prescription   Program Oxygen Prescription Continuous    Liters per minute 3      Home Oxygen   Home Oxygen Device Home  Concentrator;E-Tanks;Portable Concentrator    Sleep  Oxygen Prescription Continuous    Liters per minute 3    Home Exercise Oxygen Prescription Continuous    Liters per minute 3    Home Resting Oxygen Prescription Continuous    Liters per minute 3    Compliance with Home Oxygen Use Yes      Goals/Expected Outcomes   Short Term Goals Other    Long  Term Goals Other    Comments [name] [name] has been checking her oxygen at home and she states it been in the 90s range. She has been practicing PLB to help with her breathing. She has no questions about her respiratory medications.    Goals/Expected Outcomes Short: continue to use PLB. Long: maintain oxygen and PLB independently.             Initial Exercise Prescription:  Initial Exercise Prescription - 04/05/22 1700       Date of Initial Exercise RX and Referring Provider   Date 04/05/22    Referring Provider Galen Daft      Oxygen   Oxygen Continuous    Liters 3    Maintain Oxygen Saturation 88% or higher      NuStep   Level 1    SPM 80    Minutes 15    METs 1      Biostep-RELP   Level 1    SPM 80    Minutes 15    METs 1      Track   Laps 5    Minutes 15    METs 1.27      Prescription Details   Frequency (times per week) 2    Duration Progress to 30 minutes of continuous aerobic without signs/symptoms of physical distress      Intensity   THRR 40-80% of Max Heartrate 101-140    Ratings of Perceived Exertion 11-13    Perceived Dyspnea 0-4      Progression   Progression Continue to progress workloads to maintain intensity without signs/symptoms of physical distress.      Resistance Training   Training Prescription Yes    Weight 2    Reps 10-15             Perform Capillary Blood Glucose checks as needed.  Exercise Prescription Changes:   Exercise Prescription Changes     Row Name 04/05/22 1700 04/15/22 1100 05/12/22 1100 05/25/22 1500 06/08/22 1500     Response to Exercise   Blood Pressure (Admit)  120/70 118/64 118/78 122/72 118/64   Blood Pressure (Exercise) 130/70 124/60 122/82 130/68 124/70   Blood Pressure (Exit) 118/82 110/62 112/70 122/78 118/62   Heart Rate (Admit) 62 bpm 71 bpm 69 bpm 70 bpm 69 bpm   Heart Rate (Exercise) 77 bpm 84 bpm 116 bpm 74 bpm 100 bpm   Heart Rate (Exit) 73 bpm 76 bpm 81 bpm 72 bpm 75 bpm   Oxygen Saturation (Admit) 95 % 98 % 98 % 97 % 98 %   Oxygen Saturation (Exercise) 91 % 92 % 92 % 92 % 94 %   Oxygen Saturation (Exit) 97 % 96 % 97 % 93 % 96 %   Rating of Perceived Exertion (Exercise) 15 15 15 13 16   $ Perceived Dyspnea (Exercise) 3 3 3 1 3   $ Symptoms SOB SOB SOB SOB SOB   Comments 6 MWT results 1st full day of exercise -- -- --   Duration -- Progress to 30 minutes of  aerobic without signs/symptoms of physical distress Progress to  30 minutes of  aerobic without signs/symptoms of physical distress Progress to 30 minutes of  aerobic without signs/symptoms of physical distress Continue with 30 min of aerobic exercise without signs/symptoms of physical distress.   Intensity -- THRR unchanged THRR unchanged THRR unchanged THRR unchanged     Progression   Progression -- Continue to progress workloads to maintain intensity without signs/symptoms of physical distress. Continue to progress workloads to maintain intensity without signs/symptoms of physical distress. Continue to progress workloads to maintain intensity without signs/symptoms of physical distress. Continue to progress workloads to maintain intensity without signs/symptoms of physical distress.   Average METs -- 2.07 1.76 2.01 1.63     Resistance Training   Training Prescription -- Yes Yes Yes Yes   Weight -- 2 lb 2 lb 2 lb 2 lb   Reps -- 10-15 10-15 10-15 10-15     Interval Training   Interval Training -- No No No No     Oxygen   Oxygen -- Continuous Continuous Continuous Continuous   Liters -- 3 3 3 3     $ Treadmill   MPH -- -- 0.6 0.4 0.7   Grade -- -- 0 0 0   Minutes -- -- 15 15 15    $ METs -- -- 1.5 1.31 1.5     NuStep   Level -- 2 2 3 3   $ Minutes -- 15 15 15 15   $ METs -- 2.6 2.1 2.4 --     Biostep-RELP   Level -- -- -- 1 1   Minutes -- -- -- 15 15   METs -- -- -- 3 2     Track   Laps -- 10 16 -- --   Minutes -- 15 15 -- --   METs -- 1.54 1.87 -- --     Oxygen   Maintain Oxygen Saturation -- 88% or higher -- 88% or higher 88% or higher            Exercise Comments:   Exercise Comments     Row Name 04/07/22 1623           Exercise Comments First full day of exercise!  Patient was oriented to gym and equipment including functions, settings, policies, and procedures.  Patient's individual exercise prescription and treatment plan were reviewed.  All starting workloads were established based on the results of the 6 minute walk test done at initial orientation visit.  The plan for exercise progression was also introduced and progression will be customized based on patient's performance and goals.                Exercise Goals and Review:   Exercise Goals     Row Name 04/05/22 1709             Exercise Goals   Increase Physical Activity Yes       Intervention Provide advice, education, support and counseling about physical activity/exercise needs.;Develop an individualized exercise prescription for aerobic and resistive training based on initial evaluation findings, risk stratification, comorbidities and participant's personal goals.       Expected Outcomes Short Term: Attend rehab on a regular basis to increase amount of physical activity.;Long Term: Add in home exercise to make exercise part of routine and to increase amount of physical activity.;Long Term: Exercising regularly at least 3-5 days a week.       Increase Strength and Stamina Yes       Intervention Provide advice, education, support and counseling about physical  activity/exercise needs.;Develop an individualized exercise prescription for aerobic and resistive training based on  initial evaluation findings, risk stratification, comorbidities and participant's personal goals.       Expected Outcomes Short Term: Increase workloads from initial exercise prescription for resistance, speed, and METs.;Short Term: Perform resistance training exercises routinely during rehab and add in resistance training at home;Long Term: Improve cardiorespiratory fitness, muscular endurance and strength as measured by increased METs and functional capacity (6MWT)       Able to understand and use rate of perceived exertion (RPE) scale Yes       Intervention Provide education and explanation on how to use RPE scale       Expected Outcomes Short Term: Able to use RPE daily in rehab to express subjective intensity level;Long Term:  Able to use RPE to guide intensity level when exercising independently       Able to understand and use Dyspnea scale Yes       Intervention Provide education and explanation on how to use Dyspnea scale       Expected Outcomes Short Term: Able to use Dyspnea scale daily in rehab to express subjective sense of shortness of breath during exertion;Long Term: Able to use Dyspnea scale to guide intensity level when exercising independently       Knowledge and understanding of Target Heart Rate Range (THRR) Yes       Intervention Provide education and explanation of THRR including how the numbers were predicted and where they are located for reference       Expected Outcomes Short Term: Able to state/look up THRR;Long Term: Able to use THRR to govern intensity when exercising independently;Short Term: Able to use daily as guideline for intensity in rehab       Able to check pulse independently Yes       Intervention Provide education and demonstration on how to check pulse in carotid and radial arteries.;Review the importance of being able to check your own pulse for safety during independent exercise       Expected Outcomes Short Term: Able to explain why pulse checking is  important during independent exercise;Long Term: Able to check pulse independently and accurately       Understanding of Exercise Prescription Yes       Intervention Provide education, explanation, and written materials on patient's individual exercise prescription       Expected Outcomes Short Term: Able to explain program exercise prescription;Long Term: Able to explain home exercise prescription to exercise independently                Exercise Goals Re-Evaluation :  Exercise Goals Re-Evaluation     Row Name 04/07/22 1624 04/15/22 1150 04/27/22 1346 05/12/22 1111 05/25/22 1515     Exercise Goal Re-Evaluation   Exercise Goals Review Increase Physical Activity;Able to understand and use rate of perceived exertion (RPE) scale;Knowledge and understanding of Target Heart Rate Range (THRR);Understanding of Exercise Prescription;Able to understand and use Dyspnea scale;Able to check pulse independently;Increase Strength and Stamina Increase Physical Activity;Increase Strength and Stamina;Understanding of Exercise Prescription Increase Physical Activity;Increase Strength and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 of life;Short term: Daily weights obtained and reported for increase. Utilizing diuretic protocols set by physician.;Long term: Adoption of self-care skills and reduction of barriers for early signs and symptoms recognition and intervention leading to self-care maintenance.;Short term: Attendance in program 2-3 days a week with increased exercise capacity. Reported lower sodium intake. Reported increased fruit and vegetable intake. Reports medication compliance.    Hypertension Yes    Intervention Provide education on lifestyle modifcations including regular physical activity/exercise, weight management, moderate sodium restriction and increased consumption of fresh fruit, vegetables, and low fat dairy, alcohol moderation, and smoking cessation.;Monitor prescription use compliance.    Expected Outcomes Short Term: Continued assessment and intervention until BP is < 140/9051mG in hypertensive participants. < 130/64m76m in hypertensive participants with diabetes, heart failure or chronic kidney disease.;Long Term: Maintenance of blood pressure at goal levels.    Lipids Yes    Intervention Provide education and support for participant on  nutrition & aerobic/resistive exercise along with prescribed medications to achieve LDL <37m, HDL >445m    Expected Outcomes Short Term: Participant states understanding of desired cholesterol values and is compliant with medications prescribed. Participant is following exercise prescription and nutrition guidelines.;Long Term: Cholesterol controlled with medications as prescribed, with individualized exercise RX and with personalized nutrition plan. Value goals: LDL < 7058mHDL > 40 mg.             Education:Diabetes - Individual verbal and written instruction to review signs/symptoms of diabetes, desired ranges of glucose level fasting, after meals and with exercise. Acknowledge that pre and post exercise  glucose checks will be done for 3 sessions at entry of program. Flowsheet [facility]  Date 04/05/22  [facility]- Verbalizes Understanding       Know Your Numbers and Heart Failure: - Group verbal and visual instruction to discuss disease risk factors for cardiac and pulmonary disease and treatment options.  Reviews associated critical values for Overweight/Obesity, Hypertension, Cholesterol, and Diabetes.  Discusses basics of heart failure: signs/symptoms and treatments.  Introduces Heart Failure Zone chart for action plan for heart failure.  Written material given at graduation.   Core Components/Risk Factors/Patient Goals Review:   Goals and Risk Factor Review     Row Name 05/05/22 1622 05/24/22 1609           Core Components/Risk Factors/Patient Goals Review   Personal Goals Review Improve shortness of breath with ADL's Weight Management/Obesity      Review Spoke to patient about their shortness of breath and what they can do to improve. Patient has been informed of breathing techniques when starting the program. Patient is informed to tell staff if they have had any med changes and that certain meds they are taking or not taking can be causing shortness of breath. [name] [name] wants to lose weight and has gained some weight. She would like to lose weight and thinks that part of it is fluid retention. She takes her fluid pill in the morning but does not take it on days she exercises. Informed patient that she can take her medication in the morning and there are bathroom nearby to use if she needs them.      Expected Outcomes Short: Attend [facility] regularly to improve shortness of breath with ADL's. Long: maintain independence with ADL's Short: take  fluid pill as needed. Long: lose some weight.               Core Components/Risk Factors/Patient Goals at Discharge (Final Review):   Goals and Risk  Factor Review - 05/24/22 1609       Core Components/Risk Factors/Patient Goals Review   Personal Goals Review Weight Management/Obesity    Review [name] [name] wants to lose weight and has gained some weight. She would like to lose weight and thinks that part of it is fluid retention. She takes her fluid pill in the morning but does not take it on days she exercises. Informed patient that she can take her medication in the morning and there are bathroom nearby to use if she needs them.    Expected Outcomes Short: take  fluid pill as needed. Long: lose some weight.             ITP Comments:  ITP Comments     Row Name 03/31/22 1420 04/05/22 1657 04/07/22 1623 04/14/22 0959 05/12/22 0910   ITP Comments Virtual Visit  completed. Patient informed on EP and RD appointment and 6 Minute walk test. Patient also informed of patient health questionnaires on My Chart. Patient Verbalizes understanding. Visit diagnosis can be found in [facility] 03/29/2022. Completed 6MWT and gym orientation. Initial ITP created and sent for review to Dr. Zetta Bills, Medical Director. First full day of exercise!  Patient was oriented to gym and equipment including functions, settings, policies, and procedures.  Patient's individual exercise prescription and treatment plan were reviewed.  All starting workloads were established based on the results of the 6 minute walk test done at initial orientation visit.  The plan for exercise progression was also introduced and progression will be customized based on patient's performance and goals. 30 Day review completed. Medical Director ITP review done, changes made as directed, and signed approval by Medical Director.    new to program 30 Day review completed. Medical Director ITP review done, changes made as directed, and signed approval by Medical Director.    Gem Name 06/09/22 1217           ITP Comments 30 day review completed. ITP sent to Dr. Zetta Bills, Medical Director of   Pulmonary Rehab. Continue with ITP unless changes are made by physician.                Comments: 30 day review

## 2022-06-09 NOTE — Progress Notes (Signed)
Daily Session Note  Patient Details  Name: [name] [name] MRN: [id number] Date of Birth: Sep 08, 1960 Referring Provider:   Flowsheet Row Pulmonary Rehab from 04/05/2022 in [facility] Cardiac and Pulmonary Rehab  Referring Provider Galen Daft       Encounter Date: 06/09/2022  Check In:  Session Check In - 06/09/22 1614       Check-In   Supervising physician immediately available to respond to emergencies See telemetry face sheet for immediately available ER MD    Location [facility]-Cardiac & Pulmonary Rehab    Staff Present Renita Papa, RN BSN;Joseph Tessie Fass, Darla Lesches, MS, ACSM CEP, Exercise Physiologist;Megan Tamala Julian, RN, ADN    Virtual Visit No    Medication changes reported     No    Fall or balance concerns reported    No    Warm-up and Cool-down Performed on first and last piece of equipment    Resistance Training Performed Yes    VAD Patient? No    PAD/SET Patient? No      Pain Assessment   Currently in Pain? No/denies                Social History   Tobacco Use  Smoking Status Former   Packs/day: 2.00   Years: 27.00   Total pack years: 54.00   Types: Cigarettes   Quit date: 07/30/1999   Years since quitting: 22.8  Smokeless Tobacco Never  Tobacco Comments   quit in 2001 2-2.5 a day    Goals Met:  Independence with exercise equipment Exercise tolerated well No report of concerns or symptoms today Strength training completed today  Goals Unmet:  Not Applicable  Comments: Pt able to follow exercise prescription today without complaint.  Will continue to monitor for progression.    Dr. Emily Filbert is Medical Director for [place].  Dr. Ottie Glazier is Medical Director for [facility] Pulmonary Rehabilitation.

## 2022-06-11 ENCOUNTER — Other Ambulatory Visit: Payer: Self-pay | Admitting: Psychiatry

## 2022-06-11 MED ORDER — QUETIAPINE FUMARATE 300 MG PO TABS: ORAL_TABLET | ORAL | 5 refills | Status: DC

## 2022-06-14 ENCOUNTER — Encounter: Payer: Medicaid Other | Admitting: *Deleted

## 2022-06-14 DIAGNOSIS — J8410 Pulmonary fibrosis, unspecified: Secondary | ICD-10-CM

## 2022-06-14 NOTE — Progress Notes (Signed)
Daily Session Note  Patient Details  Name: [name] [name] MRN: [id number] Date of Birth: 21-Nov-1960 Referring Provider:   Flowsheet Row Pulmonary Rehab from 04/05/2022 in [facility] Cardiac and Pulmonary Rehab  Referring Provider Galen Daft       Encounter Date: 06/14/2022  Check In:  Session Check In - 06/14/22 1556       Check-In   Supervising physician immediately available to respond to emergencies See telemetry face sheet for immediately available ER MD    Location [facility]-Cardiac & Pulmonary Rehab    Staff Present Renita Papa, RN Odelia Gage, RN, Dimple Nanas, BS, Exercise Physiologist    Virtual Visit No    Medication changes reported     No    Fall or balance concerns reported    No    Warm-up and Cool-down Performed on first and last piece of equipment    Resistance Training Performed Yes    VAD Patient? No    PAD/SET Patient? No      Pain Assessment   Currently in Pain? No/denies                Social History   Tobacco Use  Smoking Status Former   Packs/day: 2.00   Years: 27.00   Total pack years: 54.00   Types: Cigarettes   Quit date: 07/30/1999   Years since quitting: 22.8  Smokeless Tobacco Never  Tobacco Comments   quit in 2001 2-2.5 a day    Goals Met:  Independence with exercise equipment Exercise tolerated well No report of concerns or symptoms today Strength training completed today  Goals Unmet:  Not Applicable  Comments: Pt able to follow exercise prescription today without complaint.  Will continue to monitor for progression.    Dr. Emily Filbert is Medical Director for [place].  Dr. Ottie Glazier is Medical Director for [facility] Pulmonary Rehabilitation.

## 2022-06-16 ENCOUNTER — Encounter: Payer: Medicaid Other | Admitting: *Deleted

## 2022-06-17 MED ORDER — PRAMIPEXOLE 0.125 MG TABLET
ORAL_TABLET | Freq: Every evening | ORAL | 2 refills | 30 days | Status: CP
Start: 2022-06-17 — End: 2023-06-17

## 2022-06-19 ENCOUNTER — Encounter: Payer: Self-pay | Admitting: Pulmonary Disease

## 2022-06-21 ENCOUNTER — Encounter: Payer: Medicaid Other | Attending: Pulmonary Disease | Admitting: *Deleted

## 2022-06-21 DIAGNOSIS — M0579 Rheumatoid arthritis with rheumatoid factor of multiple sites without organ or systems involvement: Principal | ICD-10-CM

## 2022-06-21 DIAGNOSIS — J8410 Pulmonary fibrosis, unspecified: Secondary | ICD-10-CM | POA: Insufficient documentation

## 2022-06-21 MED ORDER — METHOTREXATE SODIUM 2.5 MG TABLET
ORAL_TABLET | ORAL | 2 refills | 28 days | Status: CP
Start: 2022-06-21 — End: ?

## 2022-06-21 NOTE — Progress Notes (Signed)
Daily Session Note  Patient Details  Name: [name] [name] MRN: [id number] Date of Birth: [birth year] Referring Provider:   Flowsheet Row Pulmonary Rehab from 04/05/2022 in [facility] Cardiac and Pulmonary Rehab  Referring Provider Galen Daft       Encounter Date: 06/21/2022  Check In:  Session Check In - 06/21/22 1609       Check-In   Supervising physician immediately available to respond to emergencies See telemetry face sheet for immediately available ER MD    Location [facility]-Cardiac & Pulmonary Rehab    Staff Present Renita Papa, RN BSN;Joseph Tessie Fass, RCP,RRT,BSRT;Noah Peralta, [place], Exercise Physiologist    Virtual Visit No    Medication changes reported     No    Fall or balance concerns reported    No    Warm-up and Cool-down Performed on first and last piece of equipment    Resistance Training Performed Yes    VAD Patient? No    PAD/SET Patient? No      Pain Assessment   Currently in Pain? No/denies                Social History   Tobacco Use  Smoking Status Former   Packs/day: 2.00   Years: 27.00   Total pack years: 54.00   Types: Cigarettes   Quit date: 07/30/1999   Years since quitting: 22.9  Smokeless Tobacco Never  Tobacco Comments   quit in 2001 2-2.5 a day    Goals Met:  Independence with exercise equipment Exercise tolerated well No report of concerns or symptoms today Strength training completed today  Goals Unmet:  Not Applicable  Comments: Pt able to follow exercise prescription today without complaint.  Will continue to monitor for progression.    Dr. Emily Filbert is Medical Director for [place].  Dr. Ottie Glazier is Medical Director for [facility] Pulmonary Rehabilitation.

## 2022-06-24 ENCOUNTER — Ambulatory Visit: Admit: 2022-06-24 | Discharge: 2022-06-25 | Payer: PRIVATE HEALTH INSURANCE

## 2022-06-25 ENCOUNTER — Ambulatory Visit
Admit: 2022-06-25 | Discharge: 2022-06-26 | Payer: PRIVATE HEALTH INSURANCE | Attending: Student in an Organized Health Care Education/Training Program | Primary: Student in an Organized Health Care Education/Training Program

## 2022-06-25 DIAGNOSIS — M0579 Rheumatoid arthritis with rheumatoid factor of multiple sites without organ or systems involvement: Principal | ICD-10-CM

## 2022-06-25 DIAGNOSIS — U099 Post-COVID syndrome: Principal | ICD-10-CM

## 2022-06-28 ENCOUNTER — Encounter: Payer: Medicaid Other | Admitting: *Deleted

## 2022-06-28 DIAGNOSIS — J8410 Pulmonary fibrosis, unspecified: Secondary | ICD-10-CM | POA: Diagnosis not present

## 2022-06-28 NOTE — Progress Notes (Signed)
Daily Session Note  Patient Details  Name: [name] [name] MRN: [id number] Date of Birth: 1960/11/22 Referring Provider:   Flowsheet Row Pulmonary Rehab from 04/05/2022 in [facility] Cardiac and Pulmonary Rehab  Referring Provider Galen Daft       Encounter Date: 06/28/2022  Check In:  Session Check In - 06/28/22 1706       Check-In   Supervising physician immediately available to respond to emergencies See telemetry face sheet for immediately available ER MD    Location [facility]-Cardiac & Pulmonary Rehab    Staff Present Darlyne Russian, RN, Dimple Nanas, BS, Exercise Physiologist;Joseph Tessie Fass, [place]    Virtual Visit No    Medication changes reported     No    Fall or balance concerns reported    No    Warm-up and Cool-down Performed on first and last piece of equipment    Resistance Training Performed Yes    VAD Patient? No    PAD/SET Patient? No      Pain Assessment   Currently in Pain? No/denies                Social History   Tobacco Use  Smoking Status Former   Packs/day: 2.00   Years: 27.00   Total pack years: 54.00   Types: Cigarettes   Quit date: 07/30/1999   Years since quitting: 22.9  Smokeless Tobacco Never  Tobacco Comments   quit in 2001 2-2.5 a day    Goals Met:  Independence with exercise equipment Exercise tolerated well No report of concerns or symptoms today Strength training completed today  Goals Unmet:  Not Applicable  Comments: Pt able to follow exercise prescription today without complaint.  Will continue to monitor for progression.    Dr. Emily Filbert is Medical Director for [place].  Dr. Ottie Glazier is Medical Director for [facility] Pulmonary Rehabilitation.

## 2022-06-29 ENCOUNTER — Ambulatory Visit
Admit: 2022-06-29 | Discharge: 2022-06-30 | Payer: PRIVATE HEALTH INSURANCE | Attending: "Endocrinology | Primary: "Endocrinology

## 2022-06-29 DIAGNOSIS — T148XXA Other injury of unspecified body region, initial encounter: Principal | ICD-10-CM

## 2022-07-05 ENCOUNTER — Encounter: Payer: Medicaid Other | Admitting: *Deleted

## 2022-07-05 DIAGNOSIS — J8410 Pulmonary fibrosis, unspecified: Secondary | ICD-10-CM

## 2022-07-05 NOTE — Progress Notes (Signed)
Daily Session Note  Patient Details  Name: [name] [name] MRN: [id number] Date of Birth: February 06, 1961 Referring Provider:   Flowsheet Row Pulmonary Rehab from 04/05/2022 in [facility] Cardiac and Pulmonary Rehab  Referring Provider Galen Daft       Encounter Date: 07/05/2022  Check In:  Session Check In - 07/05/22 1607       Check-In   Supervising physician immediately available to respond to emergencies See telemetry face sheet for immediately available ER MD    Location [facility]-Cardiac & Pulmonary Rehab    Staff Present Renita Papa, RN BSN;Noah Tickle, BS, Exercise Physiologist;Joseph Tessie Fass, [place]    Virtual Visit No    Medication changes reported     No    Fall or balance concerns reported    No    Warm-up and Cool-down Performed on first and last piece of equipment    Resistance Training Performed Yes    VAD Patient? No    PAD/SET Patient? No      Pain Assessment   Currently in Pain? No/denies                Social History   Tobacco Use  Smoking Status Former   Packs/day: 2.00   Years: 27.00   Additional pack years: 0.00   Total pack years: 54.00   Types: Cigarettes   Quit date: 07/30/1999   Years since quitting: 22.9  Smokeless Tobacco Never  Tobacco Comments   quit in 2001 2-2.5 a day    Goals Met:  Independence with exercise equipment Exercise tolerated well No report of concerns or symptoms today Strength training completed today  Goals Unmet:  Not Applicable  Comments: Pt able to follow exercise prescription today without complaint.  Will continue to monitor for progression.    Dr. Emily Filbert is Medical Director for [place].  Dr. Ottie Glazier is Medical Director for [facility] Pulmonary Rehabilitation.

## 2022-07-07 ENCOUNTER — Encounter: Payer: Self-pay | Admitting: *Deleted

## 2022-07-07 ENCOUNTER — Encounter: Payer: Medicaid Other | Admitting: *Deleted

## 2022-07-07 DIAGNOSIS — J8410 Pulmonary fibrosis, unspecified: Secondary | ICD-10-CM

## 2022-07-07 NOTE — Progress Notes (Signed)
Pulmonary Individual Treatment Plan  Patient Details  Name: [name] [name] MRN: [id number] Date of Birth: Aug 23, 1960 Referring Provider:   Flowsheet Row Pulmonary Rehab from 04/05/2022 in [facility] Cardiac and Pulmonary Rehab  Referring Provider Galen Daft       Initial Encounter Date:  Flowsheet Row Pulmonary Rehab from 04/05/2022 in [facility] Cardiac and Pulmonary Rehab  Date 04/05/22       Visit Diagnosis: Pulmonary fibrosis ([place])  Patient's Home Medications on Admission:  Current Outpatient Medications:    acetaminophen (TYLENOL) 325 MG tablet, Take 2 tablets (650 mg total) by mouth every 6 (six) hours as needed for mild pain or moderate pain (or Fever >/= 101)., Disp: 20 tablet, Rfl: 0   amLODipine (NORVASC) 10 MG tablet, Take 10 mg by mouth daily., Disp: , Rfl:    ARIPiprazole (ABILIFY) 5 MG tablet, Take 1 tablet (5 mg total) by mouth daily., Disp: 30 tablet, Rfl: 5   ascorbic acid (VITAMIN C) 500 MG tablet, Take 1 tablet (500 mg total) by mouth daily., Disp: 30 tablet, Rfl: 0   atorvastatin (LIPITOR) 80 MG tablet, Take 80 mg by mouth daily., Disp: , Rfl:    Budeson-Glycopyrrol-Formoterol (BREZTRI AEROSPHERE) 160-9-4.8 MCG/ACT AERO, Inhale 2 puffs into the lungs in the morning and at bedtime., Disp: 5.9 g, Rfl: 0   buprenorphine (BUTRANS) 10 MCG/HR PTWK, Place 1 patch onto the skin once a week., Disp: 4 patch, Rfl: 4   busPIRone (BUSPAR) 10 MG tablet, Take 1 tablet (10 mg total) by mouth 2 (two) times daily., Disp: 60 tablet, Rfl: 5   Calcium Citrate-Vitamin D3 (GNP CALCIUM CITRATE+D MAXIMUM) 315-6.25 MG-MCG TABS, Take 1 tablet by mouth daily., Disp: , Rfl:    Cholecalciferol (VITAMIN D) 125 MCG (5000 UT) CAPS, Take 1 capsule by mouth daily., Disp: , Rfl:    ELIQUIS 5 MG TABS tablet, TAKE 1 TABLET BY MOUTH TWICE DAILY (Patient taking differently: Take 5 mg by mouth 2 (two) times daily.), Disp: 56 tablet, Rfl: 0   famotidine (PEPCID) 20 MG tablet, Take 1 tablet (20 mg total) by mouth  daily., Disp: 30 tablet, Rfl: 0   FLUoxetine (PROZAC) 40 MG capsule, Take 1 capsule (40 mg total) by mouth daily., Disp: 30 capsule, Rfl: 5   folic acid (FOLVITE) 1 MG tablet, Take 1 mg by mouth daily. , Disp: , Rfl:    hydroxychloroquine (PLAQUENIL) 200 MG tablet, Take 200 mg by mouth 2 (two) times daily., Disp: , Rfl:    levothyroxine (SYNTHROID, LEVOTHROID) 88 MCG tablet, Take 88 mcg by mouth daily before breakfast., Disp: , Rfl:    metFORMIN (GLUCOPHAGE-XR) 500 MG 24 hr tablet, Take 500 mg by mouth daily., Disp: , Rfl:    methotrexate (RHEUMATREX) 2.5 MG tablet, Take 2.5 mg by mouth once a week., Disp: , Rfl:    metoCLOPramide (REGLAN) 5 MG/5ML solution, Take 5 mg by mouth 4 (four) times daily -  before meals and at bedtime., Disp: , Rfl:    metoprolol tartrate (LOPRESSOR) 25 MG tablet, Take 25 mg by mouth 2 (two) times daily., Disp: , Rfl:    pramipexole (MIRAPEX) 0.5 MG tablet, Take 0.5 mg by mouth at bedtime., Disp: , Rfl:    pregabalin (LYRICA) 100 MG capsule, Take 1 capsule (100 mg total) by mouth 3 (three) times daily., Disp: 90 capsule, Rfl: 5   promethazine (PHENERGAN) 12.5 MG tablet, Take 12.5 mg by mouth., Disp: , Rfl:    QUEtiapine (SEROQUEL) 300 MG tablet, TAKE ONE TABLET BY MOUTH  AT BEDTIME., Disp: 30 tablet, Rfl: 5   TRADJENTA 5 MG TABS tablet, Take 5 mg by mouth daily., Disp: , Rfl:    zinc sulfate 220 (50 Zn) MG capsule, Take 220 mg by mouth at bedtime. , Disp: , Rfl:    zolpidem (AMBIEN) 5 MG tablet, Take 1 tablet (5 mg total) by mouth at bedtime., Disp: 30 tablet, Rfl: 5  Past Medical History: Past Medical History:  Diagnosis Date   Abdominal pain 02/12/2019   Abdominal wall hernia [id number]   Acute metabolic encephalopathy [id number]   AMS (altered mental status) 12/28/2021   Anxiety    Arthritis    Rheumatoid   C. difficile colitis    Chronic diastolic heart failure ([place])    COVID-19 03/23/2019   Diagnosed at [facility] (send-out) on 03/23/2019   Depression    Diabetes  mellitus    states no meds or diet restrictions  at present   Diastolic CHF (HCC)    Esophagitis    Fluid retention    GERD (gastroesophageal reflux disease)    Hiatal hernia    Hypertension    Hypokalemia due to loss of potassium 10/21/2015   Overview:  Associated with 3 weeks of diarrhea  And QT prolongation.   Hypothyroidism    IBS (irritable bowel syndrome)    Moderate episode of recurrent major depressive disorder ([place]) 06/03/2004   Morbid obesity (HCC)    MRSA (methicillin resistant Staphylococcus aureus) infection 11/2017   left inner thigh abcess   Neurogenic bladder    has pacemaker   Neuropathy    Obesity    Panic attacks    Pneumonia due to COVID-19 virus    Rheumatoid arthritis ([place])    Sleep apnea    STATES SEVERE, CANT TOLERATE MASK- LAST STUDY YEARS AGO    Tobacco Use: Social History   Tobacco Use  Smoking Status Former   Packs/day: 2.00   Years: 27.00   Additional pack years: 0.00   Total pack years: 54.00   Types: Cigarettes   Quit date: 07/30/1999   Years since quitting: 22.9  Smokeless Tobacco Never  Tobacco Comments   quit in 2001 2-2.5 a day    Labs: Review Flowsheet  More data exists      Latest Ref Rng & Units 07/09/2019 07/10/2019 01/10/2020 12/27/2021 01/04/2022  Labs for ITP Cardiac and Pulmonary Rehab  Hemoglobin A1c 4.8 - 5.6 % - - 10.8  - 6.8   PH, Arterial 7.350 - 7.450 7.38  7.48  - - -  PCO2 arterial 32.0 - 48.0 mmHg 36  25  - - -  Bicarbonate 20.0 - 28.0 mmol/L 21.3  18.6  - 27.2  -  Acid-base deficit 0.0 - 2.0 mmol/L 3.3  3.3  - - -  O2 Saturation % 96.6  97.3  - 65.4  -     Pulmonary Assessment Scores:  Pulmonary Assessment Scores     Row Name 04/05/22 1713         ADL UCSD   ADL Phase Entry     SOB Score total 81     Rest 2     Walk 3     Stairs 5     Bath 4     Dress 3     Shop 3       CAT Score   CAT Score 22       mMRC Score   mMRC Score 2  UCSD: Self-administered rating of dyspnea  associated with activities of daily living (ADLs) 6-point scale (0 = "not at all" to 5 = "maximal or unable to do because of breathlessness")  Scoring Scores range from 0 to 120.  Minimally important difference is 5 units  CAT: CAT can identify the health impairment of COPD patients and is better correlated with disease progression.  CAT has a scoring range of zero to 40. The CAT score is classified into four groups of low (less than 10), medium (10 - 20), high (21-30) and very high (31-40) based on the impact level of disease on health status. A CAT score over 10 suggests significant symptoms.  A worsening CAT score could be explained by an exacerbation, poor medication adherence, poor inhaler technique, or progression of COPD or comorbid conditions.  CAT MCID is 2 points  mMRC: mMRC (Modified Medical Research Council) Dyspnea Scale is used to assess the degree of baseline functional disability in patients of respiratory disease due to dyspnea. No minimal important difference is established. A decrease in score of 1 point or greater is considered a positive change.   Pulmonary Function Assessment:  Pulmonary Function Assessment - 03/31/22 1422       Breath   Shortness of Breath Yes;Limiting activity;Panic with Shortness of Breath             Exercise Target Goals: Exercise Program Goal: Individual exercise prescription set using results from initial 6 min walk test and THRR while considering  patient's activity barriers and safety.   Exercise Prescription Goal: Initial exercise prescription builds to 30-45 minutes a day of aerobic activity, 2-3 days per week.  Home exercise guidelines will be given to patient during program as part of exercise prescription that the participant will acknowledge.  Education: Aerobic Exercise: - Group verbal and visual presentation on the components of exercise prescription. Introduces F.I.T.T principle from ACSM for exercise prescriptions.  Reviews  F.I.T.T. principles of aerobic exercise including progression. Written material given at graduation.   Education: Resistance Exercise: - Group verbal and visual presentation on the components of exercise prescription. Introduces F.I.T.T principle from ACSM for exercise prescriptions  Reviews F.I.T.T. principles of resistance exercise including progression. Written material given at graduation.    Education: Exercise & Equipment Safety: - Individual verbal instruction and demonstration of equipment use and safety with use of the equipment. Flowsheet Row Pulmonary Rehab from 04/05/2022 in [facility] Cardiac and Pulmonary Rehab  Date 04/05/22  Educator [place]  Instruction Review Code 1- Verbalizes Understanding       Education: Exercise Physiology & General Exercise Guidelines: - Group verbal and written instruction with models to review the exercise physiology of the cardiovascular system and associated critical values. Provides general exercise guidelines with specific guidelines to those with heart or lung disease.    Education: Flexibility, Balance, Mind/Body Relaxation: - Group verbal and visual presentation with interactive activity on the components of exercise prescription. Introduces F.I.T.T principle from ACSM for exercise prescriptions. Reviews F.I.T.T. principles of flexibility and balance exercise training including progression. Also discusses the mind body connection.  Reviews various relaxation techniques to help reduce and manage stress (i.e. Deep breathing, progressive muscle relaxation, and visualization). Balance handout provided to take home. Written material given at graduation.   Activity Barriers & Risk Stratification:  Activity Barriers & Cardiac Risk Stratification - 04/05/22 1702       Activity Barriers & Cardiac Risk Stratification   Activity Barriers Shortness of Breath;History of Falls;Deconditioning;Assistive Device;Balance Concerns;Joint Problems;Arthritis  6 Minute Walk:  6 Minute Walk     Row Name 04/05/22 1659         6 Minute Walk   Phase Initial     Distance 530 feet     Walk Time 6 minutes     # of Rest Breaks 0     MPH 1     METS 1     RPE 15     Perceived Dyspnea  3     VO2 Peak 2     Symptoms Yes (comment)     Comments SOB     Resting HR 62 bpm     Resting BP 120/70     Resting Oxygen Saturation  95 %     Exercise Oxygen Saturation  during 6 min walk 91 %     Max Ex. HR 77 bpm     Max Ex. BP 130/70     2 Minute Post BP 118/82       Interval HR   1 Minute HR 69     2 Minute HR 75     3 Minute HR 77     4 Minute HR 77     5 Minute HR 77     6 Minute HR 73     2 Minute Post HR 69     Interval Heart Rate? Yes       Interval Oxygen   Interval Oxygen? Yes     Baseline Oxygen Saturation % 95 %     1 Minute Oxygen Saturation % 95 %     1 Minute Liters of Oxygen 3 L     2 Minute Oxygen Saturation % 91 %     2 Minute Liters of Oxygen 3 L     3 Minute Oxygen Saturation % 91 %     3 Minute Liters of Oxygen 3 L     4 Minute Oxygen Saturation % 92 %     4 Minute Liters of Oxygen 3 L     5 Minute Oxygen Saturation % 91 %     5 Minute Liters of Oxygen 3 L     6 Minute Oxygen Saturation % 92 %     6 Minute Liters of Oxygen 3 L     2 Minute Post Oxygen Saturation % 97 %     2 Minute Post Liters of Oxygen 3 L             Oxygen Initial Assessment:  Oxygen Initial Assessment - 07/05/22 1646       Home Oxygen   Home Oxygen Device Home Concentrator;E-Tanks;Portable Concentrator    Sleep Oxygen Prescription Continuous    Liters per minute 4    Home Exercise Oxygen Prescription Continuous    Liters per minute 4    Liters per minute 4    Compliance with Home Oxygen Use Yes      Program Oxygen Prescription   Program Oxygen Prescription Continuous    Liters per minute 4      Intervention   Short Term Goals To learn and exhibit compliance with exercise, home and travel O2 prescription;To learn and  understand importance of maintaining oxygen saturations>88%;To learn and demonstrate proper use of respiratory medications;To learn and understand importance of monitoring SPO2 with pulse oximeter and demonstrate accurate use of the pulse oximeter.;To learn and demonstrate proper pursed lip breathing techniques or other breathing techniques.     Long  Term Goals Verbalizes importance of  monitoring SPO2 with pulse oximeter and return demonstration;Exhibits proper breathing techniques, such as pursed lip breathing or other method taught during program session;Exhibits compliance with exercise, home  and travel O2 prescription;Maintenance of O2 saturations>88%;Compliance with respiratory medication             Oxygen Re-Evaluation:  Oxygen Re-Evaluation     Row Name 04/07/22 1626 05/05/22 1619 05/24/22 1606 06/28/22 1613 07/05/22 1646     Program Oxygen Prescription   Program Oxygen Prescription -- Continuous Continuous Continuous --   Liters per minute -- 3 3 4  --     Home Oxygen   Home Oxygen Device -- Home Concentrator;E-Tanks;Portable Concentrator Home Concentrator;E-Tanks;Portable Concentrator Home Concentrator;E-Tanks;Portable Concentrator --   Sleep Oxygen Prescription -- Continuous Continuous Continuous --   Liters per minute -- 3 3 4  --   Home Exercise Oxygen Prescription -- Continuous Continuous Continuous --   Liters per minute -- 3 3 4  --   Home Resting Oxygen Prescription -- Continuous Continuous Continuous --   Liters per minute -- 3 3 4  --   Compliance with Home Oxygen Use -- Yes Yes Yes --     Goals/Expected Outcomes   Short Term Goals To learn and exhibit compliance with exercise, home and travel O2 prescription;To learn and understand importance of maintaining oxygen saturations>88%;To learn and demonstrate proper use of respiratory medications;To learn and understand importance of monitoring SPO2 with pulse oximeter and demonstrate accurate use of the pulse oximeter.;To  learn and demonstrate proper pursed lip breathing techniques or other breathing techniques.  To learn and understand importance of maintaining oxygen saturations>88%;To learn and understand importance of monitoring SPO2 with pulse oximeter and demonstrate accurate use of the pulse oximeter. Other -- --   Long  Term Goals Verbalizes importance of monitoring SPO2 with pulse oximeter and return demonstration;Exhibits proper breathing techniques, such as pursed lip breathing or other method taught during program session;Demonstrates proper use of MDI's;Compliance with respiratory medication;Maintenance of O2 saturations>88%;Exhibits compliance with exercise, home  and travel O2 prescription Maintenance of O2 saturations>88%;Verbalizes importance of monitoring SPO2 with pulse oximeter and return demonstration Other -- --   Comments Reviewed PLB technique with pt.  Talked about how it works and it's importance in maintaining their exercise saturations. She has a pulse oximeter to check her/his oxygen saturation at home. Informed and explained why it is important to have one. Reviewed that oxygen saturations should be 88 percent and above. Patient verbalizes understanding. Carla Drape has been checking her oxygen at home and she states it been in the 90s range. She has been practicing PLB to help with her breathing. She has no questions about her respiratory medications. Diaphragmatic and PLB breathing explained and performed with patient. Patient has a better understanding of how to do these exercises to help with breathing performance and relaxation. Patient performed breathing techniques adequately and to practice further at home. [name] continues to monitor her O2 and HR at home. She knows when to rest and continues to practice PLB. She is staying compliant with medications and breathing techqniues. She ensures her O2 stays above 88%.   Goals/Expected Outcomes Short: Become more profiecient at using PLB.   Long: Become  independent at using PLB. Short: monitor oxygen at home with exertion. Long: maintain oxygen saturations above 88 percent independently. Short: continue to use PLB. Long: maintain oxygen and PLB independently. Short: practice PLB and diaphragmatic breathing at home. Long: Use PLB and diaphragmatic breathing independently post [employer]. Short: Continue to monitor O2 and HR at  home Long: Become efficient at PLB long-term, utilize other breathing techniques when needed            Oxygen Discharge (Final Oxygen Re-Evaluation):  Oxygen Re-Evaluation - 07/05/22 1646       Goals/Expected Outcomes   Comments [name] continues to monitor her O2 and HR at home. She knows when to rest and continues to practice PLB. She is staying compliant with medications and breathing techqniues. She ensures her O2 stays above 88%.    Goals/Expected Outcomes Short: Continue to monitor O2 and HR at home Long: Become efficient at PLB long-term, utilize other breathing techniques when needed             Initial Exercise Prescription:  Initial Exercise Prescription - 04/05/22 1700       Date of Initial Exercise RX and Referring Provider   Date 04/05/22    Referring Provider Galen Daft      Oxygen   Oxygen Continuous    Liters 3    Maintain Oxygen Saturation 88% or higher      NuStep   Level 1    SPM 80    Minutes 15    METs 1      Biostep-RELP   Level 1    SPM 80    Minutes 15    METs 1      Track   Laps 5    Minutes 15    METs 1.27      Prescription Details   Frequency (times per week) 2    Duration Progress to 30 minutes of continuous aerobic without signs/symptoms of physical distress      Intensity   THRR 40-80% of Max Heartrate 101-140    Ratings of Perceived Exertion 11-13    Perceived Dyspnea 0-4      Progression   Progression Continue to progress workloads to maintain intensity without signs/symptoms of physical distress.      Resistance Training   Training Prescription Yes     Weight 2    Reps 10-15             Perform Capillary Blood Glucose checks as needed.  Exercise Prescription Changes:   Exercise Prescription Changes     Row Name 04/05/22 1700 04/15/22 1100 05/12/22 1100 05/25/22 1500 06/08/22 1500     Response to Exercise   Blood Pressure (Admit) 120/70 118/64 118/78 122/72 118/64   Blood Pressure (Exercise) 130/70 124/60 122/82 130/68 124/70   Blood Pressure (Exit) 118/82 110/62 112/70 122/78 118/62   Heart Rate (Admit) 62 bpm 71 bpm 69 bpm 70 bpm 69 bpm   Heart Rate (Exercise) 77 bpm 84 bpm 116 bpm 74 bpm 100 bpm   Heart Rate (Exit) 73 bpm 76 bpm 81 bpm 72 bpm 75 bpm   Oxygen Saturation (Admit) 95 % 98 % 98 % 97 % 98 %   Oxygen Saturation (Exercise) 91 % 92 % 92 % 92 % 94 %   Oxygen Saturation (Exit) 97 % 96 % 97 % 93 % 96 %   Rating of Perceived Exertion (Exercise) 15 15 15 13 16    Perceived Dyspnea (Exercise) 3 3 3 1 3    Symptoms SOB SOB SOB SOB SOB   Comments 6 MWT results 1st full day of exercise -- -- --   Duration -- Progress to 30 minutes of  aerobic without signs/symptoms of physical distress Progress to 30 minutes of  aerobic without signs/symptoms of physical distress Progress to 30 minutes of  aerobic  without signs/symptoms of physical distress Continue with 30 min of aerobic exercise without signs/symptoms of physical distress.   Intensity -- THRR unchanged THRR unchanged THRR unchanged THRR unchanged     Progression   Progression -- Continue to progress workloads to maintain intensity without signs/symptoms of physical distress. Continue to progress workloads to maintain intensity without signs/symptoms of physical distress. Continue to progress workloads to maintain intensity without signs/symptoms of physical distress. Continue to progress workloads to maintain intensity without signs/symptoms of physical distress.   Average METs -- 2.07 1.76 2.01 1.63     Resistance Training   Training Prescription -- Yes Yes Yes Yes    Weight -- 2 lb 2 lb 2 lb 2 lb   Reps -- 10-15 10-15 10-15 10-15     Interval Training   Interval Training -- No No No No     Oxygen   Oxygen -- Continuous Continuous Continuous Continuous   Liters -- 3 3 3 3      Treadmill   MPH -- -- 0.6 0.4 0.7   Grade -- -- 0 0 0   Minutes -- -- 15 15 15    METs -- -- 1.5 1.31 1.5     NuStep   Level -- 2 2 3 3    Minutes -- 15 15 15 15    METs -- 2.6 2.1 2.4 --     Biostep-RELP   Level -- -- -- 1 1   Minutes -- -- -- 15 15   METs -- -- -- 3 2     Track   Laps -- 10 16 -- --   Minutes -- 15 15 -- --   METs -- 1.54 1.87 -- --     Oxygen   Maintain Oxygen Saturation -- 88% or higher -- 88% or higher 88% or higher    Row Name 06/23/22 1400 07/05/22 1600 07/06/22 1200         Response to Exercise   Blood Pressure (Admit) 126/64 -- 130/62     Blood Pressure (Exercise) 154/70 -- --     Blood Pressure (Exit) 110/62 -- 102/62     Heart Rate (Admit) 75 bpm -- 68 bpm     Heart Rate (Exercise) 78 bpm -- 70 bpm     Heart Rate (Exit) 76 bpm -- 65 bpm     Oxygen Saturation (Admit) 96 % -- 95 %     Oxygen Saturation (Exercise) 93 % -- 93 %     Oxygen Saturation (Exit) 98 % -- 97 %     Rating of Perceived Exertion (Exercise) 13 -- 13     Perceived Dyspnea (Exercise) 2 -- 2     Symptoms SOB -- SOB     Duration Continue with 30 min of aerobic exercise without signs/symptoms of physical distress. -- Continue with 30 min of aerobic exercise without signs/symptoms of physical distress.     Intensity THRR unchanged -- THRR unchanged       Progression   Progression Continue to progress workloads to maintain intensity without signs/symptoms of physical distress. -- Continue to progress workloads to maintain intensity without signs/symptoms of physical distress.     Average METs 1.76 -- 1.66       Resistance Training   Training Prescription Yes -- Yes     Weight 2 lb -- 2 lb     Reps 10-15 -- 10-15       Interval Training   Interval Training No  -- No  Oxygen   Oxygen Continuous -- Continuous     Liters 3-4 -- 4       Treadmill   MPH 0.7 -- 0.7     Grade 0 -- 0     Minutes 15 -- 15     METs 1.54 -- 1.54       NuStep   Level 3 -- 3     Minutes 15 -- 15     METs 1.5 -- 1.8       Biostep-RELP   Level 1 -- 2     Minutes 15 -- 15     METs 2 -- 2       Home Exercise Plan   Plans to continue exercise at -- Home (comment)  walking, staff videos, seated chair exercises, possibly going to [facility] (comment)  walking, staff videos, seated chair exercises, possibly going to [employer] -- Add 3 additional days to program exercise sessions.  start with 1 day, slowly build up to it Add 3 additional days to program exercise sessions.  start with 1 day, slowly build up to it     Initial Home Exercises Provided -- 07/05/22 07/05/22       Oxygen   Maintain Oxygen Saturation 88% or higher 88% or higher 88% or higher              Exercise Comments:   Exercise Comments     Row Name 04/07/22 1623           Exercise Comments First full day of exercise!  Patient was oriented to gym and equipment including functions, settings, policies, and procedures.  Patient's individual exercise prescription and treatment plan were reviewed.  All starting workloads were established based on the results of the 6 minute walk test done at initial orientation visit.  The plan for exercise progression was also introduced and progression will be customized based on patient's performance and goals.                Exercise Goals and Review:   Exercise Goals     Row Name 04/05/22 1709             Exercise Goals   Increase Physical Activity Yes       Intervention Provide advice, education, support and counseling about physical activity/exercise needs.;Develop an individualized exercise prescription for aerobic and resistive training based on initial evaluation findings, risk stratification, comorbidities and  participant's personal goals.       Expected Outcomes Short Term: Attend rehab on a regular basis to increase amount of physical activity.;Long Term: Add in home exercise to make exercise part of routine and to increase amount of physical activity.;Long Term: Exercising regularly at least 3-5 days a week.       Increase Strength and Stamina Yes       Intervention Provide advice, education, support and counseling about physical activity/exercise needs.;Develop an individualized exercise prescription for aerobic and resistive training based on initial evaluation findings, risk stratification, comorbidities and participant's personal goals.       Expected Outcomes Short Term: Increase workloads from initial exercise prescription for resistance, speed, and METs.;Short Term: Perform resistance training exercises routinely during rehab and add in resistance training at home;Long Term: Improve cardiorespiratory fitness, muscular endurance and strength as measured by increased METs and functional capacity (6MWT)       Able to understand and use rate of perceived exertion (RPE) scale Yes  Intervention Provide education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 and stay compliant with mental health treatment    Interventions Encouraged to attend Pulmonary Rehabilitation for the exercise Encouraged to attend Pulmonary Rehabilitation for the exercise Encouraged to attend Pulmonary Rehabilitation for the exercise Encouraged to attend Pulmonary Rehabilitation for the exercise    Continue Psychosocial Services  Follow up required by staff Follow up required by staff Follow up required by staff Follow up required by staff             Psychosocial Discharge (Final Psychosocial Re-Evaluation):  Psychosocial Re-Evaluation - 07/05/22 1644       Psychosocial Re-Evaluation   Current issues with Current Sleep Concerns;Current Psychotropic Meds;Current Depression    Comments PAtients last PHQ went from a 19 to an 11. She does take Ambien and Seroquel to help with her sleep. She is taking all of her medications, including ones for her mental health. She does see a psychiatrist and feels it helps her. She feels her exercise is helping as she can do more than she couldn't starting the program. At this time, she declines wanting any other intervention or assistance at this time as she is content with her current treatment of medications and seeing psychiatry.    Expected Outcomes Short: Continue coming to rehab for mood boost Long: Continue to utilize exercise for stress management and stay compliant with mental health treatment    Interventions Encouraged to attend Pulmonary Rehabilitation for the exercise    Continue Psychosocial Services  Follow up required by staff             Education: Education Goals: Education classes will be provided on a weekly basis, covering required topics. Participant will state understanding/return demonstration of topics presented.  Learning Barriers/Preferences:  Learning Barriers/Preferences - 03/31/22 1422        Learning Barriers/Preferences   Learning Barriers None    Learning Preferences None             General Pulmonary Education Topics:  Infection Prevention: - Provides verbal and written material to individual with discussion of infection control including proper hand washing and proper equipment cleaning during exercise session. Flowsheet Row Pulmonary Rehab from 04/05/2022 in [facility] Cardiac and Pulmonary Rehab  Date 04/05/22  Educator [facility]  Instruction Review Code 1- Verbalizes Understanding       Falls Prevention: - Provides verbal and written material to individual with discussion of falls prevention and safety. Flowsheet Row Pulmonary Rehab from 04/05/2022 in [facility] Cardiac and Pulmonary Rehab  Date 04/05/22  Educator [facility]  Instruction Review Code 1- Verbalizes Understanding       Chronic Lung Disease Review: - Group verbal instruction with posters, models, PowerPoint presentations and videos,  to review new updates, new respiratory medications, new advancements in procedures and treatments. Providing information on websites and "800" numbers for continued self-education. Includes information about supplement oxygen, available portable oxygen systems, continuous and intermittent flow rates, oxygen safety, concentrators, and Medicare reimbursement for oxygen. Explanation of Pulmonary Drugs, including class, frequency, complications, importance of spacers, rinsing mouth after steroid MDI's, and proper cleaning methods for nebulizers. Review of basic lung anatomy and physiology related to function, structure, and complications of lung disease. Review of risk factors. Discussion about methods for diagnosing sleep apnea and types of masks and machines for OSA. Includes a review of the use of types of environmental controls: home humidity, furnaces, filters, dust mite/pet prevention, HEPA vacuums. Discussion about weather changes, air quality  and the benefits of nasal washing.  Instruction on Warning signs, infection symptoms, calling MD promptly, preventive modes, and value of vaccinations. Review of effective airway clearance, coughing and/or vibration techniques. Emphasizing that all should Create an Action Plan. Written material given at graduation. Flowsheet Row Pulmonary Rehab from 04/05/2022 in [facility] Cardiac and Pulmonary Rehab  Education need identified 04/05/22       AED/CPR: - Group verbal and written instruction with the use of models to demonstrate the basic use of the AED with the basic ABC's of resuscitation.    Anatomy and Cardiac Procedures: - Group verbal and visual presentation and models provide information about basic cardiac anatomy and function. Reviews the testing methods done to diagnose heart disease and the outcomes of the test results. Describes the treatment choices: Medical Management, Angioplasty, or Coronary Bypass Surgery for treating various heart conditions including Myocardial Infarction, Angina, Valve Disease, and Cardiac Arrhythmias.  Written material given at graduation.   Medication Safety: - Group verbal and visual instruction to review commonly prescribed medications for heart and lung disease. Reviews the medication, class of the drug, and side effects. Includes the steps to properly store meds and maintain the prescription regimen.  Written material given at graduation.   Other: -Provides group and verbal instruction on various topics (see comments)   Knowledge Questionnaire Score:  Knowledge Questionnaire Score - 04/05/22 1711       Knowledge Questionnaire Score   Pre Score 15/18              Core Components/Risk Factors/Patient Goals at Admission:  Personal Goals and Risk Factors at Admission - 04/05/22 1711       Core Components/Risk Factors/Patient Goals on Admission    Weight Management Weight Loss;Yes    Intervention Weight Management: Develop a combined nutrition and exercise program designed to  reach desired caloric intake, while maintaining appropriate intake of nutrient and fiber, sodium and fats, and appropriate energy expenditure required for the weight goal.;Weight Management: Provide education and appropriate resources to help participant work on and attain dietary goals.;Weight Management/Obesity: Establish reasonable short term and long term weight goals.;Obesity: Provide education and appropriate resources to help participant work on and attain dietary goals.    Admit Weight 318 lb 1.6 oz (144.3 kg)    Goal Weight: Short Term 308 lb (139.7 kg)    Goal Weight: Long Term 250 lb (113.4 kg)    Expected Outcomes Short Term: Continue to assess and modify interventions until short term weight is achieved;Long Term: Adherence to nutrition and physical activity/exercise program aimed toward attainment of established weight goal;Weight Maintenance: Understanding of the daily nutrition guidelines, which includes 25-35% calories from fat, 7% or less cal from saturated fats, less than 200mg  cholesterol, less than 1.5gm of sodium, & 5 or more servings of fruits and vegetables daily;Weight Loss: Understanding of general recommendations for a balanced deficit meal plan, which promotes 1-2 lb weight loss per week and includes a negative energy balance of [phone] kcal/d;Understanding recommendations for meals to include 15-35% energy as protein, 25-35% energy from fat, 35-60% energy from carbohydrates, less than 200mg  of dietary cholesterol, 20-35 gm of total fiber daily;Understanding of distribution of calorie intake throughout the day with the consumption of 4-5 meals/snacks    Improve shortness of breath with ADL's Yes    Intervention Provide education, individualized exercise plan and daily activity instruction to help decrease symptoms of SOB with activities of daily living.    Expected Outcomes Short Term: Improve cardiorespiratory fitness to  achieve a reduction of symptoms when performing ADLs;Long  Term: Be able to perform more ADLs without symptoms or delay the onset of symptoms    Diabetes Yes    Intervention Provide education about signs/symptoms and action to take for hypo/hyperglycemia.;Provide education about proper nutrition, including hydration, and aerobic/resistive exercise prescription along with prescribed medications to achieve blood glucose in normal ranges: Fasting glucose 65-99 mg/dL    Expected Outcomes Long Term: Attainment of HbA1C < 7%.;Short Term: Participant verbalizes understanding of the signs/symptoms and immediate care of hyper/hypoglycemia, proper foot care and importance of medication, aerobic/resistive exercise and nutrition plan for blood glucose control.    Heart Failure Yes    Intervention Provide a combined exercise and nutrition program that is supplemented with education, support and counseling about heart failure. Directed toward relieving symptoms such as shortness of breath, decreased exercise tolerance, and extremity edema.    Expected Outcomes Improve functional capacity of life;Short term: Daily weights obtained and reported for increase. Utilizing diuretic protocols set by physician.;Long term: Adoption of self-care skills and reduction of barriers for early signs and symptoms recognition and intervention leading to self-care maintenance.;Short term: Attendance in program 2-3 days a week with increased exercise capacity. Reported lower sodium intake. Reported increased fruit and vegetable intake. Reports medication compliance.    Hypertension Yes    Intervention Provide education on lifestyle modifcations including regular physical activity/exercise, weight management, moderate sodium restriction and increased consumption of fresh fruit, vegetables, and low fat dairy, alcohol moderation, and smoking cessation.;Monitor prescription use compliance.    Expected Outcomes Short Term: Continued assessment and intervention until BP is < 140/35mm HG in hypertensive  participants. < 130/78mm HG in hypertensive participants with diabetes, heart failure or chronic kidney disease.;Long Term: Maintenance of blood pressure at goal levels.    Lipids Yes    Intervention Provide education and support for participant on nutrition & aerobic/resistive exercise along with prescribed medications to achieve LDL 70mg , HDL >40mg .    Expected Outcomes Short Term: Participant states understanding of desired cholesterol values and is compliant with medications prescribed. Participant is following exercise prescription and nutrition guidelines.;Long Term: Cholesterol controlled with medications as prescribed, with individualized exercise RX and with personalized nutrition plan. Value goals: LDL < 70mg , HDL > 40 mg.             Education:Diabetes - Individual verbal and written instruction to review signs/symptoms of diabetes, desired ranges of glucose level fasting, after meals and with exercise. Acknowledge that pre and post exercise glucose checks will be done for 3 sessions at entry of program. Flowsheet Row Pulmonary Rehab from 04/05/2022 in [facility] Cardiac and Pulmonary Rehab  Date 04/05/22  Educator [facility]  Instruction Review Code 1- Verbalizes Understanding       Know Your Numbers and Heart Failure: - Group verbal and visual instruction to discuss disease risk factors for cardiac and pulmonary disease and treatment options.  Reviews associated critical values for Overweight/Obesity, Hypertension, Cholesterol, and Diabetes.  Discusses basics of heart failure: signs/symptoms and treatments.  Introduces Heart Failure Zone chart for action plan for heart failure.  Written material given at graduation.   Core Components/Risk Factors/Patient Goals Review:   Goals and Risk Factor Review     Row Name 05/05/22 1622 05/24/22 1609 06/28/22 1619 07/05/22 1636       Core Components/Risk Factors/Patient Goals Review   Personal Goals Review Improve shortness of breath with ADL's  Weight Management/Obesity Weight Management/Obesity Weight Management/Obesity;Diabetes;Improve shortness of breath with ADL's  Review Spoke to patient about their shortness of breath and what they can do to improve. Patient has been informed of breathing techniques when starting the program. Patient is informed to tell staff if they have had any med changes and that certain meds they are taking or not taking can be causing shortness of breath. [name] wants to lose weight and has gained some weight. She would like to lose weight and thinks that part of it is fluid retention. She takes her fluid pill in the morning but does not take it on days she exercises. Informed patient that she can take her medication in the morning and there are bathroom nearby to use if she needs them. Patient would like to lose a few pounds and reach her weight goal of 220 pounds. She states she has always ate once a day and finds it hard to change. [name] checks her blood sugar at least once/ day and it ranges anywhere between 110-180 blood sugar. She does feel her SOB has improved some in addition to her endurance. Shecan now walk on the treadmill for the full 20 minutes, which she couldnt do before. She is still trying to lose wieght but tends to fluctuate. She is not eating as much some days as it  is impaired by flare ups of gatroparesis. She called her doctor and left a message to see what next steps they can do. They are aware she is going through flare ups. She is trying to eat more salads. Eats PB and meat for protein. Her goal weight in mind is 220 lb. She wants to try to move more at home which she thinks will help. Staying compliant with medications.    Expected Outcomes Short: Attend [facility] regularly to improve shortness of breath with ADL's. Long: maintain independence with ADL's Short: take  fluid pill as needed. Long: lose some weight. Short: lose some weight in the next few weeks. Long: reach a weight goal of  220  pounds. Short: Call doctor and get answers on gatroparesis flare ups Long: Continue to manage lifestyle risk factors             Core Components/Risk Factors/Patient Goals at Discharge (Final Review):   Goals and Risk Factor Review - 07/05/22 1636       Core Components/Risk Factors/Patient Goals Review   Personal Goals Review Weight Management/Obesity;Diabetes;Improve shortness of breath with ADL's    Review [name] checks her blood sugar at least once/ day and it ranges anywhere between 110-180 blood sugar. She does feel her SOB has improved some in addition to her endurance. Shecan now walk on the treadmill for the full 20 minutes, which she couldnt do before. She is still trying to lose wieght but tends to fluctuate. She is not eating as much some days as it  is impaired by flare ups of gatroparesis. She called her doctor and left a message to see what next steps they can do. They are aware she is going through flare ups. She is trying to eat more salads. Eats PB and meat for protein. Her goal weight in mind is 220 lb. She wants to try to move more at home which she thinks will help. Staying compliant with medications.    Expected Outcomes Short: Call doctor and get answers on gatroparesis flare ups Long: Continue to manage lifestyle risk factors             ITP Comments:  ITP Comments     Row Name 03/31/22  1420 04/05/22 1657 04/07/22 1623 04/14/22 0959 05/12/22 0910   ITP Comments Virtual Visit completed. Patient informed on EP and RD appointment and 6 Minute walk test. Patient also informed of patient health questionnaires on My Chart. Patient Verbalizes understanding. Visit diagnosis can be found in [facility] 03/29/2022. Completed 6MWT and gym orientation. Initial ITP created and sent for review to Dr. Zetta Bills, Medical Director. First full day of exercise!  Patient was oriented to gym and equipment including functions, settings, policies, and procedures.  Patient's individual  exercise prescription and treatment plan were reviewed.  All starting workloads were established based on the results of the 6 minute walk test done at initial orientation visit.  The plan for exercise progression was also introduced and progression will be customized based on patient's performance and goals. 30 Day review completed. Medical Director ITP review done, changes made as directed, and signed approval by Medical Director.    new to program 30 Day review completed. Medical Director ITP review done, changes made as directed, and signed approval by Medical Director.    Oval Name 06/09/22 1217 07/07/22 0806         ITP Comments 30 day review completed. ITP sent to Dr. Zetta Bills, Medical Director of  Pulmonary Rehab. Continue with ITP unless changes are made by physician. 30 Day review completed. Medical Director ITP review done, changes made as directed, and signed approval by Medical Director.               Comments:

## 2022-07-07 NOTE — Progress Notes (Signed)
Daily Session Note  Patient Details  Name: [name] [name] MRN: [id number] Date of Birth: 08-17-1960 Referring Provider:   Flowsheet Row Pulmonary Rehab from 04/05/2022 in [facility] Cardiac and Pulmonary Rehab  Referring Provider Galen Daft       Encounter Date: 07/07/2022  Check In:  Session Check In - 07/07/22 1559       Check-In   Supervising physician immediately available to respond to emergencies See telemetry face sheet for immediately available ER MD    Location [facility]-Cardiac & Pulmonary Rehab    Staff Present Renita Papa, RN BSN;Megan Tamala Julian, RN, Terie Purser, RCP,RRT,BSRT    Virtual Visit No    Medication changes reported     No    Fall or balance concerns reported    No    Warm-up and Cool-down Performed on first and last piece of equipment    Resistance Training Performed Yes    VAD Patient? No    PAD/SET Patient? No      Pain Assessment   Currently in Pain? No/denies                Social History   Tobacco Use  Smoking Status Former   Packs/day: 2.00   Years: 27.00   Additional pack years: 0.00   Total pack years: 54.00   Types: Cigarettes   Quit date: 07/30/1999   Years since quitting: 22.9  Smokeless Tobacco Never  Tobacco Comments   quit in 2001 2-2.5 a day    Goals Met:  Independence with exercise equipment Exercise tolerated well No report of concerns or symptoms today Strength training completed today  Goals Unmet:  Not Applicable  Comments: Pt able to follow exercise prescription today without complaint.  Will continue to monitor for progression.    Dr. Emily Filbert is Medical Director for [facility].  Dr. Ottie Glazier is Medical Director for [facility] Pulmonary Rehabilitation.

## 2022-07-09 ENCOUNTER — Other Ambulatory Visit: Payer: Medicaid Other

## 2022-07-09 NOTE — Patient Outreach (Signed)
Medicaid Managed Care Social Work Note  07/09/2022 Name:  [name] [name] MRN:  [id number] DOB:  [birth year]  [name] [name] is an 62 y.o. year old female who is a primary patient of Verita Lamb, NP.  The Medicaid Managed Care Coordination team was consulted for assistance with:  Community Resources   [name] [name] was given information about Medicaid Managed Care Coordination team services today. [name] Patient agreed to services and verbal consent obtained.  Engaged with patient  for by telephone forfollow up visit in response to referral for case management and/or care coordination services.   Assessments/Interventions:  Review of past medical history, allergies, medications, health status, including review of consultants reports, laboratory and other test data, was performed as part of comprehensive evaluation and provision of chronic care management services.  SDOH: (Social Determinant of Health) assessments and interventions performed: SDOH Interventions    Flowsheet Row Pulmonary Rehab from 06/28/2022 in [facility] Cardiac and Pulmonary Rehab Pulmonary Rehab from 05/24/2022 in [facility] Cardiac and Pulmonary Rehab Pulmonary Rehab from 05/05/2022 in [facility] Cardiac and Pulmonary Rehab Patient Outreach Telephone from 05/03/2022 in [place] Patient Outreach Telephone from 04/28/2022 in [place] Pulmonary Rehab from 04/05/2022 in [facility] Cardiac and Pulmonary Rehab  SDOH Interventions        Transportation Interventions -- -- -- Intervention Not Indicated -- --  Depression Interventions/Treatment  Medication Medication Medication -- -- Medication  Stress Interventions -- -- -- -- [employer], Provide Counseling --     BSW completed a telephone outreach with patient. She states everything is going well and no resources are needed. BSW provided patient with her information if a need for resources arises.  Advanced  Directives Status:  Not addressed in this encounter.  Care Plan                 Allergies  Allergen Reactions   Cephalexin Hives   Codeine Palpitations, Nausea Only, Nausea And Vomiting, Rash and Shortness Of Breath    "makes heart fly, she gets flushed and passes out"   Doxycycline Rash   Propoxyphene Rash and Shortness Of Breath    Increase heart rate   Sulfa Antibiotics Palpitations, Nausea Only, Shortness Of Breath and Hives    "makes heart fly, she gets flushed and passes out"   Clindamycin Rash    Tongue swelling, oral sores, and Mouth rash   Lovenox [Enoxaparin Sodium] Hives   Hydrocodone Nausea And Vomiting    Hear racing & breaks out into a cold sweat.   Meropenem Rash    Erythematous, hot, pruritic rash over arms, chest, back, abdomen, and face occurred at the end of meropenem infusion on 02/22/18    Medications Reviewed Today     Reviewed by Tyler Pita, MD (Physician) on 06/19/22 at 1243  Med List Status: <None>   Medication Order Taking? Sig Documenting Provider Last Dose Status Informant  acetaminophen (TYLENOL) 325 MG tablet [id number] Yes Take 2 tablets (650 mg total) by mouth every 6 (six) hours as needed for mild pain or moderate pain (or Fever >/= 101). Raiford Noble Hedrick, DO Taking Active Self  amLODipine (NORVASC) 10 MG tablet [id number]  Take 10 mg by mouth daily. [provider]  Active Self  ARIPiprazole (ABILIFY) 5 MG tablet [id number]  Take 1 tablet (5 mg total) by mouth daily. Clapacs, Madie Reno, MD  Active   ascorbic acid (VITAMIN C) 500 MG tablet [id number] Yes Take 1  tablet (500 mg total) by mouth daily. Lavina Hamman, MD Taking Active Self  atorvastatin (LIPITOR) 80 MG tablet [id number]  Take 80 mg by mouth daily. [provider]  Active Self  Budeson-Glycopyrrol-Formoterol (BREZTRI AEROSPHERE) 160-9-4.8 MCG/ACT AERO [id number]  Inhale 2 puffs into the lungs in the morning and at bedtime. Tyler Pita, MD  Active Self   buprenorphine [place]) 10 MCG/HR PTWK [id number]  Place 1 patch onto the skin once a week. Gillis Santa, MD  Active   busPIRone (BUSPAR) 10 MG tablet [id number]  Take 1 tablet (10 mg total) by mouth 2 (two) times daily. Clapacs, Madie Reno, MD  Active   Calcium Citrate-Vitamin D3 (GNP CALCIUM CITRATE+D MAXIMUM) 315-6.25 MG-MCG TABS [id number]  Take 1 tablet by mouth daily. [provider]  Active Self  Cholecalciferol (VITAMIN D) 125 MCG (5000 UT) CAPS [id number] Yes Take 1 capsule by mouth daily. [provider] Taking Active Self  ELIQUIS 5 MG TABS tablet [id number]  TAKE 1 TABLET BY MOUTH TWICE DAILY  Patient taking differently: Take 5 mg by mouth 2 (two) times daily.   Tyler Pita, MD  Active Self  famotidine (PEPCID) 20 MG tablet [id number] Yes Take 1 tablet (20 mg total) by mouth daily. Lavina Hamman, MD Taking Active Self  FLUoxetine (PROZAC) 40 MG capsule [id number]  Take 1 capsule (40 mg total) by mouth daily. Clapacs, Madie Reno, MD  Active   folic acid (FOLVITE) 1 MG tablet [id number] Yes Take 1 mg by mouth daily.  [provider] Taking Active Self  hydroxychloroquine (PLAQUENIL) 200 MG tablet [id number]  Take 200 mg by mouth 2 (two) times daily. [provider]  Active Self  levothyroxine (SYNTHROID, LEVOTHROID) 88 MCG tablet [id number] Yes Take 88 mcg by mouth daily before breakfast. [provider] Taking Active Self  metFORMIN (GLUCOPHAGE-XR) 500 MG 24 hr tablet [id number] Yes Take 500 mg by mouth daily. [provider] Taking Active Self  methotrexate (RHEUMATREX) 2.5 MG tablet [id number]  Take 2.5 mg by mouth once a week. [provider]  Active   metoCLOPramide (REGLAN) 5 MG/5ML solution [id number]  Take 5 mg by mouth 4 (four) times daily -  before meals and at bedtime. [provider]  Active Self           Med Note (ROBB, MELANIE A   Wed Mar 17, 2022  9:19 AM) Taking 10 mg 4 times daily  metoprolol tartrate  (LOPRESSOR) 25 MG tablet [id number] Yes Take 25 mg by mouth 2 (two) times daily. [provider] Taking Active Self  pramipexole (MIRAPEX) 0.5 MG tablet [id number]  Take 0.5 mg by mouth at bedtime. [provider]  Active   pregabalin (LYRICA) 100 MG capsule [id number]  Take 1 capsule (100 mg total) by mouth 3 (three) times daily. Gillis Santa, MD  Active   promethazine (PHENERGAN) 12.5 MG tablet [id number]  Take 12.5 mg by mouth. [provider]  Active Self  QUEtiapine (SEROQUEL) 300 MG tablet [id number]  TAKE ONE TABLET BY MOUTH AT BEDTIME. Clapacs, Madie Reno, MD  Active   TRADJENTA 5 MG TABS tablet [id number] Yes Take 5 mg by mouth daily. [provider] Taking Active Self  zinc sulfate 220 (50 Zn) MG capsule [id number] Yes Take 220 mg by mouth at bedtime.  [provider] Taking Active Self  zolpidem (AMBIEN) 5 MG tablet [id number]  Take 1 tablet (5 mg total) by mouth at bedtime. Clapacs, Madie Reno, MD  Active  Med List Note Janett Billow, RN 05/20/22 1145): MR 10-07-22 PA sent via [place] complete with records via fax [phone] LP (buprenorphine 10 mcg/h)            Patient Active Problem List   Diagnosis Date Noted   Gastroparesis 02/28/2022   Allergy to multiple antibiotics 02/28/2022   Lactic acidosis 01/05/2022   Sepsis ([place]) 01/04/2022   Acute cystitis 12/27/2021   Morbid obesity ([place])    Depression with anxiety    PAF (paroxysmal atrial fibrillation) ([place])    Chronic respiratory failure with hypoxia ([place]) 02/07/2020   Paroxysmal A-fib ([place])    Morbid obesity with BMI of 50.0-59.9, adult ([place]) 07/08/2019   Controlled type 2 diabetes mellitus without complication, without long-term current use of insulin ([place]) 04/06/2019   CKD (chronic kidney disease), stage III ([place]) 04/06/2019   HLD (hyperlipidemia) 04/06/2019   AKI (acute kidney injury) ([place]) 02/12/2019   Diabetic ulcer of left foot ([place]) 02/14/2018   Chronic pain syndrome  03/31/2016   Rheumatoid arthritis ([place]) 12/25/2015   Osteoarthritis, multiple sites 12/24/2015   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[employer]) 12/23/2015   Long term current use of opiate analgesic 12/23/2015   Long term prescription opiate use 12/23/2015   Diabetic peripheral neuropathy ([place]) 12/23/2015   Chronic diastolic CHF (congestive heart failure) ([place]) 07/03/2014   COPD (chronic obstructive pulmonary disease) ([place]) 01/03/2014   Bipolar disorder, unspecified ([place]) 01/03/2014   Borderline personality disorder ([place]) 01/06/2012   Hypothyroidism 06/28/2010   Rheumatoid arthritis involving multiple sites with positive rheumatoid factor ([place]) 06/28/2010   Essential (primary) hypertension 01/27/2005   Major depressive disorder, recurrent episode, moderate ([place]) 06/03/2004    Conditions to be addressed/monitored per PCP order:   community resources  There are no care plans that you recently modified to display for this patient.   Follow up:  Patient agrees to Care Plan and Follow-up.  Plan: The  Patient has been provided with contact information for the Managed Medicaid care management team and has been advised to call with any health related questions or concerns.   Mickel Fuchs, BSW, [place] Managed Medicaid Team  [phone]

## 2022-07-09 NOTE — Patient Instructions (Signed)
Visit Information  [name] [name] was given information about Medicaid Managed Care team care coordination services as a part of their [place] Complete Medicaid benefit. [name] verbally consented to engagement with the [facility] Managed Care team.   If you are experiencing a medical emergency, please call 911 or report to your local emergency department or urgent care.   If you have a non-emergency medical problem during routine business hours, please contact your provider's office and ask to speak with a nurse.   For questions related to your [place] Complete Medicaid health plan, please call: [phone]  If you would like to schedule transportation through your [place] Complete Medicaid plan, please call the following number at least 2 days in advance of your appointment: [phone].   There is no limit to the number of trips during the year between medical appointments, healthcare facilities, or pharmacies. Transportation must be scheduled at least 2 business days before but not more than thirty 30 days before of your appointment.  Call the [place] at [phone], at any time, 24 hours a day, 7 days a week. If you are in danger or need immediate medical attention call 911.  If you would like help to quit smoking, call 1-800-QUIT-NOW (319) 784-0257) OR Espaol: 1-855-Djelo-Ya HD:1601594) o para ms informacin haga clic aqu or Text READY to 200-400 to register via text  [name] - following are the goals we discussed in your visit today:    The  Patient                                              has been provided with contact information for the Managed Medicaid care management team and has been advised to call with any health related questions or concerns.   Mickel Fuchs, BSW, [place] Managed Medicaid Team  [phone]   Following is a copy of your plan of care:  There are no care plans  that you recently modified to display for this patient.

## 2022-07-12 ENCOUNTER — Encounter: Payer: Medicaid Other | Admitting: *Deleted

## 2022-07-14 ENCOUNTER — Encounter: Payer: Medicaid Other | Admitting: *Deleted

## 2022-07-14 DIAGNOSIS — J8410 Pulmonary fibrosis, unspecified: Secondary | ICD-10-CM

## 2022-07-14 NOTE — Progress Notes (Signed)
Daily Session Note  Patient Details  Name: [name] [name] MRN: [id number] Date of Birth: 12-16-1960 Referring Provider:   Flowsheet Row Pulmonary Rehab from 04/05/2022 in [facility] Cardiac and Pulmonary Rehab  Referring Provider Galen Daft       Encounter Date: 07/14/2022  Check In:  Session Check In - 07/14/22 1601       Check-In   Supervising physician immediately available to respond to emergencies See telemetry face sheet for immediately available ER MD    Location [facility]-Cardiac & Pulmonary Rehab    Staff Present Renita Papa, RN BSN;Joseph Tessie Fass, RCP,RRT,BSRT;Megan Tamala Julian, RN, [place]    Virtual Visit No    Medication changes reported     No    Fall or balance concerns reported    No    Warm-up and Cool-down Performed on first and last piece of equipment    Resistance Training Performed Yes    VAD Patient? No    PAD/SET Patient? No      Pain Assessment   Currently in Pain? No/denies                Social History   Tobacco Use  Smoking Status Former   Packs/day: 2.00   Years: 27.00   Additional pack years: 0.00   Total pack years: 54.00   Types: Cigarettes   Quit date: 07/30/1999   Years since quitting: 22.9  Smokeless Tobacco Never  Tobacco Comments   quit in 2001 2-2.5 a day    Goals Met:  Independence with exercise equipment Exercise tolerated well No report of concerns or symptoms today Strength training completed today  Goals Unmet:  Not Applicable  Comments: Pt able to follow exercise prescription today without complaint.  Will continue to monitor for progression.'    Dr. Emily Filbert is Medical Director for [place].  Dr. Ottie Glazier is Medical Director for [facility] Pulmonary Rehabilitation.

## 2022-07-19 ENCOUNTER — Encounter: Payer: Medicaid Other | Attending: Pulmonary Disease | Admitting: *Deleted

## 2022-07-19 DIAGNOSIS — J8410 Pulmonary fibrosis, unspecified: Secondary | ICD-10-CM | POA: Diagnosis not present

## 2022-07-19 NOTE — Progress Notes (Signed)
Daily Session Note  Patient Details  Name: [name] [name] MRN: [id number] Date of Birth: [birth year] Referring Provider:   Flowsheet Row Pulmonary Rehab from 04/05/2022 in [facility] Cardiac and Pulmonary Rehab  Referring Provider Galen Daft       Encounter Date: 07/19/2022  Check In:  Session Check In - 07/19/22 1559       Check-In   Supervising physician immediately available to respond to emergencies See telemetry face sheet for immediately available ER MD    Location [facility]-Cardiac & Pulmonary Rehab    Staff Present Renita Papa, RN BSN;Joseph Tessie Fass, RCP,RRT,BSRT;Noah [place], [place], Exercise Physiologist    Virtual Visit No    Medication changes reported     No    Fall or balance concerns reported    No    Warm-up and Cool-down Performed on first and last piece of equipment    Resistance Training Performed Yes    VAD Patient? No    PAD/SET Patient? No      Pain Assessment   Currently in Pain? No/denies                Social History   Tobacco Use  Smoking Status Former   Packs/day: 2.00   Years: 27.00   Additional pack years: 0.00   Total pack years: 54.00   Types: Cigarettes   Quit date: 07/30/1999   Years since quitting: 22.9  Smokeless Tobacco Never  Tobacco Comments   quit in 2001 2-2.5 a day    Goals Met:  Independence with exercise equipment Exercise tolerated well No report of concerns or symptoms today Strength training completed today  Goals Unmet:  Not Applicable  Comments: Pt able to follow exercise prescription today without complaint.  Will continue to monitor for progression.    Dr. Emily Filbert is Medical Director for [place].  Dr. Ottie Glazier is Medical Director for [facility] Pulmonary Rehabilitation.

## 2022-07-21 ENCOUNTER — Encounter: Payer: Medicaid Other | Admitting: *Deleted

## 2022-07-21 DIAGNOSIS — J8410 Pulmonary fibrosis, unspecified: Secondary | ICD-10-CM

## 2022-07-21 NOTE — Progress Notes (Signed)
Daily Session Note  Patient Details  Name: [name] [name] MRN: [id number] Date of Birth: 29-Jun-1960 Referring Provider:   Flowsheet Row Pulmonary Rehab from 04/05/2022 in [facility] Cardiac and Pulmonary Rehab  Referring Provider Galen Daft       Encounter Date: 07/21/2022  Check In:  Session Check In - 07/21/22 1607       Check-In   Supervising physician immediately available to respond to emergencies See telemetry face sheet for immediately available ER MD    Location [facility]-Cardiac & Pulmonary Rehab    Staff Present Renita Papa, RN BSN;Joseph [place], RCP,RRT,BSRT;Laureen [place], [place], RRT, CPFT    Virtual Visit No    Medication changes reported     No    Fall or balance concerns reported    No    Warm-up and Cool-down Performed on first and last piece of equipment    Resistance Training Performed Yes    VAD Patient? No    PAD/SET Patient? No      Pain Assessment   Currently in Pain? No/denies                Social History   Tobacco Use  Smoking Status Former   Packs/day: 2.00   Years: 27.00   Additional pack years: 0.00   Total pack years: 54.00   Types: Cigarettes   Quit date: 07/30/1999   Years since quitting: 22.9  Smokeless Tobacco Never  Tobacco Comments   quit in 2001 2-2.5 a day    Goals Met:  Independence with exercise equipment Exercise tolerated well No report of concerns or symptoms today Strength training completed today  Goals Unmet:  Not Applicable  Comments: Pt able to follow exercise prescription today without complaint.  Will continue to monitor for progression.    Dr. Emily Filbert is Medical Director for [place].  Dr. Ottie Glazier is Medical Director for [facility] Pulmonary Rehabilitation.

## 2022-07-26 ENCOUNTER — Encounter: Payer: Medicaid Other | Admitting: *Deleted

## 2022-07-26 DIAGNOSIS — J8410 Pulmonary fibrosis, unspecified: Secondary | ICD-10-CM | POA: Diagnosis not present

## 2022-07-26 NOTE — Progress Notes (Signed)
Daily Session Note  Patient Details  Name: BRISTYN PENNY MRN: [id number] Date of Birth: 1960-06-17 Referring Provider:   Flowsheet Row Pulmonary Rehab from 04/05/2022 in [facility] Cardiac and Pulmonary Rehab  Referring Provider Francene Boyers       Encounter Date: 07/26/2022  Check In:  Session Check In - 07/26/22 1601       Check-In   Supervising physician immediately available to respond to emergencies See telemetry face sheet for immediately available ER MD    Location [facility]-Cardiac & Pulmonary Rehab    Staff Present Susann Givens, RN BSN;Joseph Reino [place], RCP,RRT,BSRT;Noah [place], [place], Exercise Physiologist    Virtual Visit No    Medication changes reported     No    Fall or balance concerns reported    No    Warm-up and Cool-down Performed on first and last piece of equipment    Resistance Training Performed Yes    VAD Patient? No    PAD/SET Patient? No      Pain Assessment   Currently in Pain? No/denies                Social History   Tobacco Use  Smoking Status Former   Packs/day: 2.00   Years: 27.00   Additional pack years: 0.00   Total pack years: 54.00   Types: Cigarettes   Quit date: 07/30/1999   Years since quitting: 23.0  Smokeless Tobacco Never  Tobacco Comments   quit in 2001 2-2.5 a day    Goals Met:  Independence with exercise equipment Exercise tolerated well No report of concerns or symptoms today Strength training completed today  Goals Unmet:  Not Applicable  Comments: Pt able to follow exercise prescription today without complaint.  Will continue to monitor for progression.    Dr. Bethann Punches is Medical Director for [facility] Cardiac Rehabilitation.  Dr. Vida Rigger is Medical Director for [facility] Pulmonary Rehabilitation.

## 2022-07-29 MED ORDER — ASCORBIC ACID (VITAMIN C) 500 MG TABLET
ORAL_TABLET | Freq: Every day | ORAL | 9 refills | 30 days | Status: CP
Start: 2022-07-29 — End: 2023-07-28

## 2022-08-02 ENCOUNTER — Encounter: Payer: Medicaid Other | Admitting: *Deleted

## 2022-08-02 DIAGNOSIS — J8410 Pulmonary fibrosis, unspecified: Secondary | ICD-10-CM

## 2022-08-02 MED ORDER — ARIPIPRAZOLE 5 MG TABLET
ORAL_TABLET | Freq: Every day | ORAL | 1 refills | 90 days | Status: CP
Start: 2022-08-02 — End: 2023-08-02

## 2022-08-02 NOTE — Progress Notes (Signed)
Daily Session Note  Patient Details  Name: [name] [name] MRN: [id number] Date of Birth: 08/03/1960 Referring Provider:   Flowsheet Row Pulmonary Rehab from 04/05/2022 in [facility] Cardiac and Pulmonary Rehab  Referring Provider Francene Boyers       Encounter Date: 08/02/2022  Check In:  Session Check In - 08/02/22 1614       Check-In   Supervising physician immediately available to respond to emergencies See telemetry face sheet for immediately available ER MD    Location [facility]-Cardiac & Pulmonary Rehab    Staff Present Lanny Hurst, RN, ADN;Joseph Reino [place], RCP,RRT,BSRT;Noah Tickle, BS, Exercise Physiologist    Virtual Visit No    Medication changes reported     No    Fall or balance concerns reported    No    Warm-up and Cool-down Performed on first and last piece of equipment    Resistance Training Performed Yes    VAD Patient? No    PAD/SET Patient? No      Pain Assessment   Currently in Pain? No/denies                Social History   Tobacco Use  Smoking Status Former   Packs/day: 2.00   Years: 27.00   Additional pack years: 0.00   Total pack years: 54.00   Types: Cigarettes   Quit date: 07/30/1999   Years since quitting: 23.0  Smokeless Tobacco Never  Tobacco Comments   quit in 2001 2-2.5 a day    Goals Met:  Independence with exercise equipment Exercise tolerated well No report of concerns or symptoms today Strength training completed today  Goals Unmet:  Not Applicable  Comments: Pt able to follow exercise prescription today without complaint.  Will continue to monitor for progression.    Dr. Bethann Punches is Medical Director for [facility] Cardiac Rehabilitation.  Dr. Vida Rigger is Medical Director for [facility] Pulmonary Rehabilitation.

## 2022-08-03 ENCOUNTER — Telehealth (INDEPENDENT_AMBULATORY_CARE_PROVIDER_SITE_OTHER): Payer: Medicaid Other | Admitting: Psychiatry

## 2022-08-03 DIAGNOSIS — F331 Major depressive disorder, recurrent, moderate: Secondary | ICD-10-CM

## 2022-08-03 DIAGNOSIS — F411 Generalized anxiety disorder: Secondary | ICD-10-CM | POA: Diagnosis not present

## 2022-08-03 MED ORDER — FLUOXETINE HCL 40 MG PO CAPS: 40.0000 mg | ORAL_CAPSULE | Freq: Every day | ORAL | 5 refills | Status: DC

## 2022-08-03 MED ORDER — BUSPIRONE HCL 10 MG PO TABS: 10.0000 mg | ORAL_TABLET | Freq: Two times a day (BID) | ORAL | 5 refills | Status: DC

## 2022-08-03 MED ORDER — QUETIAPINE FUMARATE 300 MG PO TABS: ORAL_TABLET | ORAL | 5 refills | Status: DC

## 2022-08-03 MED ORDER — ZOLPIDEM TARTRATE 5 MG PO TABS: 5.0000 mg | ORAL_TABLET | Freq: Every day | ORAL | 5 refills | Status: DC

## 2022-08-03 MED ORDER — ARIPIPRAZOLE 5 MG PO TABS: 5.0000 mg | ORAL_TABLET | Freq: Every day | ORAL | 5 refills | Status: DC

## 2022-08-03 NOTE — Progress Notes (Signed)
Virtual Visit via Telephone Note  I connected with [name] [name] on 08/03/22 at  1:40 PM EDT by telephone and verified that I am speaking with the correct person using two identifiers.  Location: Patient: Home Provider: Hospital   I discussed the limitations, risks, security and privacy concerns of performing an evaluation and management service by telephone and the availability of in person appointments. I also discussed with the patient that there may be a patient responsible charge related to this service. The patient expressed understanding and agreed to proceed.   History of Present Illness: Patient reached by telephone.  Patient had no complaints and was on time for the appointment.  Denied mood symptoms.  Denied anxiety symptoms.  Denied any physical symptoms.  Stated life was going pretty well right now.  Confirmed compliance with medication.    Observations/Objective: Alert and oriented with normal affect.  Lucid thinking no sign of disorganized thinking no sign of dangerousness  Assessment and Plan: Continue current medication.  Patient has been informed that I will be leaving the office in another 2 months and that plans are being made to assist her in reassignment to an alternate physician.   Follow Up Instructions:    I discussed the assessment and treatment plan with the patient. The patient was provided an opportunity to ask questions and all were answered. The patient agreed with the plan and demonstrated an understanding of the instructions.   The patient was advised to call back or seek an in-person evaluation if the symptoms worsen or if the condition fails to improve as anticipated.  I provided 20 minutes of non-face-to-face time during this encounter.   Mordecai Rasmussen, MD

## 2022-08-04 ENCOUNTER — Encounter: Payer: Self-pay | Admitting: *Deleted

## 2022-08-04 DIAGNOSIS — J8410 Pulmonary fibrosis, unspecified: Secondary | ICD-10-CM

## 2022-08-04 NOTE — Progress Notes (Signed)
Pulmonary Individual Treatment Plan  Patient Details  Name: [name] [name] MRN: [id number] Date of Birth: 1960/07/19 Referring Provider:   Flowsheet Row Pulmonary Rehab from 04/05/2022 in [facility] Card[facility] and Pulmonary Rehab  Referring Provider Francene Boyers       Initial Encounter Date:  Flowsheet Row Pulmonary Rehab from 04/05/2022 in [facility] and Pulmonary Rehab  Date 04/05/22       Visit Diagnosis: Pulmonary fibrosis  Patient's Home Medications on Admission:  Current Outpatient Medications:    acetaminophen (TYLENOL) 325 MG tablet, Take 2 tablets (650 mg total) by mouth every 6 (six) hours as needed for mild pain or moderate pain (or Fever >/= 101)., Disp: 20 tablet, Rfl: 0   amLODipine (NORVASC) 10 MG tablet, Take 10 mg by mouth daily., Disp: , Rfl:    ARIPiprazole (ABILIFY) 5 MG tablet, Take 1 tablet (5 mg total) by mouth daily., Disp: 30 tablet, Rfl: 5   ascorbic acid (VITAMIN C) 500 MG tablet, Take 1 tablet (500 mg total) by mouth daily., Disp: 30 tablet, Rfl: 0   atorvastatin (LIPITOR) 80 MG tablet, Take 80 mg by mouth daily., Disp: , Rfl:    Budeson-Glycopyrrol-Formoterol (BREZTRI AEROSPHERE) 160-9-4.8 MCG/ACT AERO, Inhale 2 puffs into the lungs in the morning and at bedtime., Disp: 5.9 g, Rfl: 0   buprenorphine (BUTRANS) 10 MCG/HR PTWK, Place 1 patch onto the skin once a week., Disp: 4 patch, Rfl: 4   busPIRone (BUSPAR) 10 MG tablet, Take 1 tablet (10 mg total) by mouth 2 (two) times daily., Disp: 60 tablet, Rfl: 5   Calcium Citrate-Vitamin D3 (GNP CALCIUM CITRATE+D MAXIMUM) 315-6.25 MG-MCG TABS, Take 1 tablet by mouth daily., Disp: , Rfl:    Cholecalciferol (VITAMIN D) 125 MCG (5000 UT) CAPS, Take 1 capsule by mouth daily., Disp: , Rfl:    ELIQUIS 5 MG TABS tablet, TAKE 1 TABLET BY MOUTH TWICE DAILY (Patient taking differently: Take 5 mg by mouth 2 (two) times daily.), Disp: 56 tablet, Rfl: 0   famotidine (PEPCID) 20 MG tablet, Take 1 tablet (20 mg total) by mouth daily.,  Disp: 30 tablet, Rfl: 0   FLUoxetine (PROZAC) 40 MG capsule, Take 1 capsule (40 mg total) by mouth daily., Disp: 30 capsule, Rfl: 5   folic acid (FOLVITE) 1 MG tablet, Take 1 mg by mouth daily. , Disp: , Rfl:    hydroxychloroquine (PLAQUENIL) 200 MG tablet, Take 200 mg by mouth 2 (two) times daily., Disp: , Rfl:    levothyroxine (SYNTHROID, LEVOTHROID) 88 MCG tablet, Take 88 mcg by mouth daily before breakfast., Disp: , Rfl:    metFORMIN (GLUCOPHAGE-XR) 500 MG 24 hr tablet, Take 500 mg by mouth daily., Disp: , Rfl:    methotrexate (RHEUMATREX) 2.5 MG tablet, Take 2.5 mg by mouth once a week., Disp: , Rfl:    metoCLOPramide (REGLAN) 5 MG/5ML solution, Take 5 mg by mouth 4 (four) times daily -  before meals and at bedtime., Disp: , Rfl:    metoprolol tartrate (LOPRESSOR) 25 MG tablet, Take 25 mg by mouth 2 (two) times daily., Disp: , Rfl:    pramipexole (MIRAPEX) 0.5 MG tablet, Take 0.5 mg by mouth at bedtime., Disp: , Rfl:    pregabalin (LYRICA) 100 MG capsule, Take 1 capsule (100 mg total) by mouth 3 (three) times daily., Disp: 90 capsule, Rfl: 5   promethazine (PHENERGAN) 12.5 MG tablet, Take 12.5 mg by mouth., Disp: , Rfl:    QUEtiapine (SEROQUEL) 300 MG tablet, TAKE ONE TABLET BY MOUTH AT  BEDTIME., Disp: 30 tablet, Rfl: 5   TRADJENTA 5 MG TABS tablet, Take 5 mg by mouth daily., Disp: , Rfl:    zinc sulfate 220 (50 Zn) MG capsule, Take 220 mg by mouth at bedtime. , Disp: , Rfl:    zolpidem (AMBIEN) 5 MG tablet, Take 1 tablet (5 mg total) by mouth at bedtime., Disp: 30 tablet, Rfl: 5  Past Medical History: Past Medical History:  Diagnosis Date   Abdominal pain 02/12/2019   Abdominal wall hernia 01/29/2013   Acute metabolic encephalopathy 07/08/2019   AMS (altered mental status) 12/28/2021   Anxiety    Arthritis    Rheumatoid   C. difficile colitis    Chronic diastolic heart failure (HCC)    COVID-19 03/23/2019   Diagnosed at [facility] (send-out) on 03/23/2019   Depression    Diabetes mellitus     states no meds or diet restrictions  at present   Diastolic CHF (HCC)    Esophagitis    Fluid retention    GERD (gastroesophageal reflux disease)    Hiatal hernia    Hypertension    Hypokalemia due to loss of potassium 10/21/2015   Overview:  Associated with 3 weeks of diarrhea  And QT prolongation.   Hypothyroidism    IBS (irritable bowel syndrome)    Moderate episode of recurrent major depressive disorder (HCC) 06/03/2004   Morbid obesity (HCC)    MRSA (methicillin resistant Staphylococcus aureus) infection 11/2017   left inner thigh abcess   Neurogenic bladder    has pacemaker   Neuropathy    Obesity    Panic attacks    Pneumonia due to COVID-19 virus    Rheumatoid arthritis (HCC)    Sleep apnea    STATES SEVERE, CANT TOLERATE MASK- LAST STUDY YEARS AGO    Tobacco Use: Social History   Tobacco Use  Smoking Status Former   Packs/day: 2.00   Years: 27.00   Additional pack years: 0.00   Total pack years: 54.00   Types: Cigarettes   Quit date: 07/30/1999   Years since quitting: 23.0  Smokeless Tobacco Never  Tobacco Comments   quit in 2001 2-2.5 a day    Labs: Review Flowsheet  More data exists      Latest Ref Rng & Units 07/09/2019 07/10/2019 01/10/2020 12/27/2021 01/04/2022  Labs for ITP Cardiac and Pulmonary Rehab  Hemoglobin A1c 4.8 - 5.6 % - - 10.8  - 6.8   PH, Arterial 7.350 - 7.450 7.38  7.48  - - -  PCO2 arterial 32.0 - 48.0 mmHg 36  25  - - -  Bicarbonate 20.0 - 28.0 mmol/L 21.3  18.6  - 27.2  -  Acid-base deficit 0.0 - 2.0 mmol/L 3.3  3.3  - - -  O2 Saturation % 96.6  97.3  - 65.4  -     Pulmonary Assessment Scores:  Pulmonary Assessment Scores     Row Name 04/05/22 1713         ADL UCSD   ADL Phase Entry     SOB Score total 81     Rest 2     Walk 3     Stairs 5     Bath 4     Dress 3     Shop 3       CAT Score   CAT Score 22       mMRC Score   mMRC Score 2  UCSD: Self-administered rating of dyspnea associated with  activities of daily living (ADLs) 6-point scale (0 = "not at all" to 5 = "maximal or unable to do because of breathlessness")  Scoring Scores range from 0 to 120.  Minimally important difference is 5 units  CAT: CAT can identify the health impairment of COPD patients and is better correlated with disease progression.  CAT has a scoring range of zero to 40. The CAT score is classified into four groups of low (less than 10), medium (10 - 20), high (21-30) and very high (31-40) based on the impact level of disease on health status. A CAT score over 10 suggests significant symptoms.  A worsening CAT score could be explained by an exacerbation, poor medication adherence, poor inhaler technique, or progression of COPD or comorbid conditions.  CAT MCID is 2 points  mMRC: mMRC (Modified Medical Research Council) Dyspnea Scale is used to assess the degree of baseline functional disability in patients of respiratory disease due to dyspnea. No minimal important difference is established. A decrease in score of 1 point or greater is considered a positive change.   Pulmonary Function Assessment:  Pulmonary Function Assessment - 03/31/22 1422       Breath   Shortness of Breath Yes;Limiting activity;Panic with Shortness of Breath             Exercise Target Goals: Exercise Program Goal: Individual exercise prescription set using results from initial 6 min walk test and THRR while considering  patient's activity barriers and safety.   Exercise Prescription Goal: Initial exercise prescription builds to 30-45 minutes a day of aerobic activity, 2-3 days per week.  Home exercise guidelines will be given to patient during program as part of exercise prescription that the participant will acknowledge.  Education: Aerobic Exercise: - Group verbal and visual presentation on the components of exercise prescription. Introduces F.I.T.T principle from ACSM for exercise prescriptions.  Reviews F.I.T.T.  principles of aerobic exercise including progression. Written material given at graduation.   Education: Resistance Exercise: - Group verbal and visual presentation on the components of exercise prescription. Introduces F.I.T.T principle from ACSM for exercise prescriptions  Reviews F.I.T.T. principles of resistance exercise including progression. Written material given at graduation.    Education: Exercise & Equipment Safety: - Individual verbal instruction and demonstration of equipment use and safety with use of the equipment. Flowsheet Row Pulmonary Rehab from 04/05/2022 in [facility] Cardiac and Pulmonary Rehab  Date 04/05/22  Educator [facility]  Instruction Review Code 1- Verbalizes Understanding       Education: Exercise Physiology & General Exercise Guidelines: - Group verbal and written instruction with models to review the exercise physiology of the cardiovascular system and associated critical values. Provides general exercise guidelines with specific guidelines to those with heart or lung disease.    Education: Flexibility, Balance, Mind/Body Relaxation: - Group verbal and visual presentation with interactive activity on the components of exercise prescription. Introduces F.I.T.T principle from ACSM for exercise prescriptions. Reviews F.I.T.T. principles of flexibility and balance exercise training including progression. Also discusses the mind body connection.  Reviews various relaxation techniques to help reduce and manage stress (i.e. Deep breathing, progressive muscle relaxation, and visualization). Balance handout provided to take home. Written material given at graduation.   Activity Barriers & Risk Stratification:  Activity Barriers & Cardiac Risk Stratification - 04/05/22 1702       Activity Barriers & Cardiac Risk Stratification   Activity Barriers Shortness of Breath;History of Falls;Deconditioning;Assistive Device;Balance Concerns;Joint Problems;Arthritis  6  Minute Walk:  6 Minute Walk     Row Name 04/05/22 1659         6 Minute Walk   Phase Initial     Distance 530 feet     Walk Time 6 minutes     # of Rest Breaks 0     MPH 1     METS 1     RPE 15     Perceived Dyspnea  3     VO2 Peak 2     Symptoms Yes (comment)     Comments SOB     Resting HR 62 bpm     Resting BP 120/70     Resting Oxygen Saturation  95 %     Exercise Oxygen Saturation  during 6 min walk 91 %     Max Ex. HR 77 bpm     Max Ex. BP 130/70     2 Minute Post BP 118/82       Interval HR   1 Minute HR 69     2 Minute HR 75     3 Minute HR 77     4 Minute HR 77     5 Minute HR 77     6 Minute HR 73     2 Minute Post HR 69     Interval Heart Rate? Yes       Interval Oxygen   Interval Oxygen? Yes     Baseline Oxygen Saturation % 95 %     1 Minute Oxygen Saturation % 95 %     1 Minute Liters of Oxygen 3 L     2 Minute Oxygen Saturation % 91 %     2 Minute Liters of Oxygen 3 L     3 Minute Oxygen Saturation % 91 %     3 Minute Liters of Oxygen 3 L     4 Minute Oxygen Saturation % 92 %     4 Minute Liters of Oxygen 3 L     5 Minute Oxygen Saturation % 91 %     5 Minute Liters of Oxygen 3 L     6 Minute Oxygen Saturation % 92 %     6 Minute Liters of Oxygen 3 L     2 Minute Post Oxygen Saturation % 97 %     2 Minute Post Liters of Oxygen 3 L             Oxygen Initial Assessment:  Oxygen Initial Assessment - 07/05/22 1646       Home Oxygen   Home Oxygen Device Home Concentrator;E-Tanks;Portable Concentrator    Sleep Oxygen Prescription Continuous    Liters per minute 4    Home Exercise Oxygen Prescription Continuous    Liters per minute 4    Liters per minute 4    Compliance with Home Oxygen Use Yes      Program Oxygen Prescription   Program Oxygen Prescription Continuous    Liters per minute 4      Intervention   Short Term Goals To learn and exhibit compliance with exercise, home and travel O2 prescription;To learn and understand  importance of maintaining oxygen saturations>88%;To learn and demonstrate proper use of respiratory medications;To learn and understand importance of monitoring SPO2 with pulse oximeter and demonstrate accurate use of the pulse oximeter.;To learn and demonstrate proper pursed lip breathing techniques or other breathing techniques.     Long  Term Goals Verbalizes importance  of monitoring SPO2 with pulse oximeter and return demonstration;Exhibits proper breathing techniques, such as pursed lip breathing or other method taught during program session;Exhibits compliance with exercise, home  and travel O2 prescription;Maintenance of O2 saturations>88%;Compliance with respiratory medication             Oxygen Re-Evaluation:  Oxygen Re-Evaluation     Row Name 04/07/22 1626 05/05/22 1619 05/24/22 1606 06/28/22 1613 07/05/22 1646     Program Oxygen Prescription   Program Oxygen Prescription -- Continuous Continuous Continuous --   Liters per minute -- 3 3 4  --     Home Oxygen   Home Oxygen Device -- Home Concentrator;E-Tanks;Portable Concentrator Home Concentrator;E-Tanks;Portable Concentrator Home Concentrator;E-Tanks;Portable Concentrator --   Sleep Oxygen Prescription -- Continuous Continuous Continuous --   Liters per minute -- 3 3 4  --   Home Exercise Oxygen Prescription -- Continuous Continuous Continuous --   Liters per minute -- 3 3 4  --   Home Resting Oxygen Prescription -- Continuous Continuous Continuous --   Liters per minute -- 3 3 4  --   Compliance with Home Oxygen Use -- Yes Yes Yes --     Goals/Expected Outcomes   Short Term Goals To learn and exhibit compliance with exercise, home and travel O2 prescription;To learn and understand importance of maintaining oxygen saturations>88%;To learn and demonstrate proper use of respiratory medications;To learn and understand importance of monitoring SPO2 with pulse oximeter and demonstrate accurate use of the pulse oximeter.;To learn and  demonstrate proper pursed lip breathing techniques or other breathing techniques.  To learn and understand importance of maintaining oxygen saturations>88%;To learn and understand importance of monitoring SPO2 with pulse oximeter and demonstrate accurate use of the pulse oximeter. Other -- --   Long  Term Goals Verbalizes importance of monitoring SPO2 with pulse oximeter and return demonstration;Exhibits proper breathing techniques, such as pursed lip breathing or other method taught during program session;Demonstrates proper use of MDI's;Compliance with respiratory medication;Maintenance of O2 saturations>88%;Exhibits compliance with exercise, home  and travel O2 prescription Maintenance of O2 saturations>88%;Verbalizes importance of monitoring SPO2 with pulse oximeter and return demonstration Other -- --   Comments Reviewed PLB technique with pt.  Talked about how it works and it's importance in maintaining their exercise saturations. She has a pulse oximeter to check her/his oxygen saturation at home. Informed and explained why it is important to have one. Reviewed that oxygen saturations should be 88 percent and above. Patient verbalizes understanding. [name] has been checking her oxygen at home and she states it been in the 90s range. She has been practicing PLB to help with her breathing. She has no questions about her respiratory medications. Diaphragmatic and PLB breathing explained and performed with patient. Patient has a better understanding of how to do these exercises to help with breathing performance and relaxation. Patient performed breathing techniques adequately and to practice further at home. [name] continues to monitor her O2 and HR at home. She knows when to rest and continues to practice PLB. She is staying compliant with medications and breathing techqniues. She ensures her O2 stays above 88%.   Goals/Expected Outcomes Short: Become more profiecient at using PLB.   Long: Become independent  at using PLB. Short: monitor oxygen at home with exertion. Long: maintain oxygen saturations above 88 percent independently. Short: continue to use PLB. Long: maintain oxygen and PLB independently. Short: practice PLB and diaphragmatic breathing at home. Long: Use PLB and diaphragmatic breathing independently post [employer]. Short: Continue to monitor O2 and HR  at home Long: Become efficient at PLB long-term, utilize other breathing techniques when needed    Row Name 07/19/22 1619             Program Oxygen Prescription   Program Oxygen Prescription Continuous       Liters per minute 4         Home Oxygen   Home Oxygen Device Home Concentrator;E-Tanks;Portable Concentrator       Sleep Oxygen Prescription Continuous       Liters per minute 4       Home Exercise Oxygen Prescription Continuous       Liters per minute 4       Home Resting Oxygen Prescription Continuous       Liters per minute 4       Compliance with Home Oxygen Use Yes         Goals/Expected Outcomes   Short Term Goals To learn and demonstrate proper pursed lip breathing techniques or other breathing techniques.        Long  Term Goals Exhibits proper breathing techniques, such as pursed lip breathing or other method taught during program session       Comments Diaphragmatic and PLB breathing explained and performed with patient. Patient has a better understanding of how to do these exercises to help with breathing performance and relaxation. Patient performed breathing techniques adequately and to practice further at home.       Goals/Expected Outcomes Short: practice PLB and diaphragmatic breathing at home. Long: Use PLB and diaphragmatic breathing independently post [employer].                Oxygen Discharge (Final Oxygen Re-Evaluation):  Oxygen Re-Evaluation - 07/19/22 1619       Program Oxygen Prescription   Program Oxygen Prescription Continuous    Liters per minute 4      Home Oxygen   Home Oxygen Device  Home Concentrator;E-Tanks;Portable Concentrator    Sleep Oxygen Prescription Continuous    Liters per minute 4    Home Exercise Oxygen Prescription Continuous    Liters per minute 4    Home Resting Oxygen Prescription Continuous    Liters per minute 4    Compliance with Home Oxygen Use Yes      Goals/Expected Outcomes   Short Term Goals To learn and demonstrate proper pursed lip breathing techniques or other breathing techniques.     Long  Term Goals Exhibits proper breathing techniques, such as pursed lip breathing or other method taught during program session    Comments Diaphragmatic and PLB breathing explained and performed with patient. Patient has a better understanding of how to do these exercises to help with breathing performance and relaxation. Patient performed breathing techniques adequately and to practice further at home.    Goals/Expected Outcomes Short: practice PLB and diaphragmatic breathing at home. Long: Use PLB and diaphragmatic breathing independently post [employer].             Initial Exercise Prescription:  Initial Exercise Prescription - 04/05/22 1700       Date of Initial Exercise RX and Referring Provider   Date 04/05/22    Referring Provider Francene Boyers      Oxygen   Oxygen Continuous    Liters 3    Maintain Oxygen Saturation 88% or higher      NuStep   Level 1    SPM 80    Minutes 15    METs 1      Biostep-RELP  Level 1    SPM 80    Minutes 15    METs 1      Track   Laps 5    Minutes 15    METs 1.27      Prescription Details   Frequency (times per week) 2    Duration Progress to 30 minutes of continuous aerobic without signs/symptoms of physical distress      Intensity   THRR 40-80% of Max Heartrate 101-140    Ratings of Perceived Exertion 11-13    Perceived Dyspnea 0-4      Progression   Progression Continue to progress workloads to maintain intensity without signs/symptoms of physical distress.      Resistance Training    Training Prescription Yes    Weight 2    Reps 10-15             Perform Capillary Blood Glucose checks as needed.  Exercise Prescription Changes:   Exercise Prescription Changes     Row Name 04/05/22 1700 04/15/22 1100 05/12/22 1100 05/25/22 1500 06/08/22 1500     Response to Exercise   Blood Pressure (Admit) 120/70 118/64 118/78 122/72 118/64   Blood Pressure (Exercise) 130/70 124/60 122/82 130/68 124/70   Blood Pressure (Exit) 118/82 110/62 112/70 122/78 118/62   Heart Rate (Admit) 62 bpm 71 bpm 69 bpm 70 bpm 69 bpm   Heart Rate (Exercise) 77 bpm 84 bpm 116 bpm 74 bpm 100 bpm   Heart Rate (Exit) 73 bpm 76 bpm 81 bpm 72 bpm 75 bpm   Oxygen Saturation (Admit) 95 % 98 % 98 % 97 % 98 %   Oxygen Saturation (Exercise) 91 % 92 % 92 % 92 % 94 %   Oxygen Saturation (Exit) 97 % 96 % 97 % 93 % 96 %   Rating of Perceived Exertion (Exercise) 15 15 15 13 16    Perceived Dyspnea (Exercise) 3 3 3 1 3    Symptoms SOB SOB SOB SOB SOB   Comments 6 MWT results 1st full day of exercise -- -- --   Duration -- Progress to 30 minutes of  aerobic without signs/symptoms of physical distress Progress to 30 minutes of  aerobic without signs/symptoms of physical distress Progress to 30 minutes of  aerobic without signs/symptoms of physical distress Continue with 30 min of aerobic exercise without signs/symptoms of physical distress.   Intensity -- THRR unchanged THRR unchanged THRR unchanged THRR unchanged     Progression   Progression -- Continue to progress workloads to maintain intensity without signs/symptoms of physical distress. Continue to progress workloads to maintain intensity without signs/symptoms of physical distress. Continue to progress workloads to maintain intensity without signs/symptoms of physical distress. Continue to progress workloads to maintain intensity without signs/symptoms of physical distress.   Average METs -- 2.07 1.76 2.01 1.63     Resistance Training   Training  Prescription -- Yes Yes Yes Yes   Weight -- 2 lb 2 lb 2 lb 2 lb   Reps -- 10-15 10-15 10-15 10-15     Interval Training   Interval Training -- No No No No     Oxygen   Oxygen -- Continuous Continuous Continuous Continuous   Liters -- 3 3 3 3      Treadmill   MPH -- -- 0.6 0.4 0.7   Grade -- -- 0 0 0   Minutes -- -- 15 15 15    METs -- -- 1.5 1.31 1.5     NuStep  Level -- 2 2 3 3    Minutes -- 15 15 15 15    METs -- 2.6 2.1 2.4 --     Biostep-RELP   Level -- -- -- 1 1   Minutes -- -- -- 15 15   METs -- -- -- 3 2     Track   Laps -- 10 16 -- --   Minutes -- 15 15 -- --   METs -- 1.54 1.87 -- --     Oxygen   Maintain Oxygen Saturation -- 88% or higher -- 88% or higher 88% or higher    Row Name 06/23/22 1400 07/05/22 1600 07/06/22 1200 07/22/22 0900       Response to Exercise   Blood Pressure (Admit) 126/64 -- 130/62 140/68    Blood Pressure (Exercise) 154/70 -- -- --    Blood Pressure (Exit) 110/62 -- 102/62 132/62    Heart Rate (Admit) 75 bpm -- 68 bpm 70 bpm    Heart Rate (Exercise) 78 bpm -- 70 bpm 88 bpm    Heart Rate (Exit) 76 bpm -- 65 bpm 70 bpm    Oxygen Saturation (Admit) 96 % -- 95 % 98 %    Oxygen Saturation (Exercise) 93 % -- 93 % 89 %    Oxygen Saturation (Exit) 98 % -- 97 % 98 %    Rating of Perceived Exertion (Exercise) 13 -- 13 13    Perceived Dyspnea (Exercise) 2 -- 2 3    Symptoms SOB -- SOB SOB    Duration Continue with 30 min of aerobic exercise without signs/symptoms of physical distress. -- Continue with 30 min of aerobic exercise without signs/symptoms of physical distress. Continue with 30 min of aerobic exercise without signs/symptoms of physical distress.    Intensity THRR unchanged -- THRR unchanged THRR unchanged      Progression   Progression Continue to progress workloads to maintain intensity without signs/symptoms of physical distress. -- Continue to progress workloads to maintain intensity without signs/symptoms of physical distress.  Continue to progress workloads to maintain intensity without signs/symptoms of physical distress.    Average METs 1.76 -- 1.66 1.91      Resistance Training   Training Prescription Yes -- Yes Yes    Weight 2 lb -- 2 lb 2 lb    Reps 10-15 -- 10-15 10-15      Interval Training   Interval Training No -- No No      Oxygen   Oxygen Continuous -- Continuous Continuous    Liters 3-4 -- 4 4      Treadmill   MPH 0.7 -- 0.7 0.6    Grade 0 -- 0 0    Minutes 15 -- 15 15    METs 1.54 -- 1.54 1.46      NuStep   Level 3 -- 3 5    Minutes 15 -- 15 15    METs 1.5 -- 1.8 3.2      Biostep-RELP   Level 1 -- 2 --    Minutes 15 -- 15 --    METs 2 -- 2 --      Home Exercise Plan   Plans to continue exercise at -- Home (comment)  walking, staff videos, seated chair exercises, possibly going to [facility] (comment)  walking, staff videos, seated chair exercises, possibly going to [facility] (comment)  walking, staff videos, seated chair exercises, possibly going to [employer] -- Add 3 additional days to program exercise  sessions.  start with 1 day, slowly build up to it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

## 2022-08-09 ENCOUNTER — Encounter: Payer: Medicaid Other | Admitting: *Deleted

## 2022-08-09 DIAGNOSIS — J8410 Pulmonary fibrosis, unspecified: Secondary | ICD-10-CM | POA: Diagnosis not present

## 2022-08-09 NOTE — Progress Notes (Signed)
Daily Session Note  Patient Details  Name: [name] [name] MRN: [id number] Date of Birth: 1960/08/15 Referring Provider:   Flowsheet Row Pulmonary Rehab from 04/05/2022 in [facility] Cardiac and Pulmonary Rehab  Referring Provider Francene Boyers       Encounter Date: 08/09/2022  Check In:  Session Check In - 08/09/22 1608       Check-In   Supervising physician immediately available to respond to emergencies See telemetry face sheet for immediately available ER MD    Location [facility]-Cardiac & Pulmonary Rehab    Staff Present Susann Givens, RN BSN;Joseph Reino Kent, RCP,RRT,BSRT;Noah Hersey, [place], Exercise Physiologist    Virtual Visit No    Medication changes reported     No    Fall or balance concerns reported    No    Warm-up and Cool-down Performed on first and last piece of equipment    Resistance Training Performed Yes    VAD Patient? No    PAD/SET Patient? No      Pain Assessment   Currently in Pain? No/denies                Social History   Tobacco Use  Smoking Status Former   Packs/day: 2.00   Years: 27.00   Additional pack years: 0.00   Total pack years: 54.00   Types: Cigarettes   Quit date: 07/30/1999   Years since quitting: 23.0  Smokeless Tobacco Never  Tobacco Comments   quit in 2001 2-2.5 a day    Goals Met:  Independence with exercise equipment Exercise tolerated well No report of concerns or symptoms today Strength training completed today  Goals Unmet:  Not Applicable  Comments: Pt able to follow exercise prescription today without complaint.  Will continue to monitor for progression.    Dr. Bethann Punches is Medical Director for [facility] Cardiac Rehabilitation.  Dr. Vida Rigger is Medical Director for [facility] Pulmonary Rehabilitation.

## 2022-08-11 ENCOUNTER — Encounter: Payer: Medicaid Other | Admitting: *Deleted

## 2022-08-11 DIAGNOSIS — J8410 Pulmonary fibrosis, unspecified: Secondary | ICD-10-CM | POA: Diagnosis not present

## 2022-08-11 NOTE — Progress Notes (Signed)
Daily Session Note  Patient Details  Name: [name] [name] MRN: [id number] Date of Birth: 04/18/1961 Referring Provider:   Flowsheet Row Pulmonary Rehab from 04/05/2022 in [facility] Cardiac and Pulmonary Rehab  Referring Provider Francene Boyers       Encounter Date: 08/11/2022  Check In:  Session Check In - 08/11/22 1623       Check-In   Supervising physician immediately available to respond to emergencies See telemetry face sheet for immediately available ER MD    Location [facility]-Cardiac & Pulmonary Rehab    Staff Present Susann Givens, RN BSN;Joseph Halma, RCP,RRT,BSRT;Kelly [place], [place], ACSM CEP, Exercise Physiologist    Virtual Visit No    Medication changes reported     No    Fall or balance concerns reported    No    Warm-up and Cool-down Performed on first and last piece of equipment    Resistance Training Performed Yes    VAD Patient? No    PAD/SET Patient? No      Pain Assessment   Currently in Pain? No/denies                Social History   Tobacco Use  Smoking Status Former   Packs/day: 2.00   Years: 27.00   Additional pack years: 0.00   Total pack years: 54.00   Types: Cigarettes   Quit date: 07/30/1999   Years since quitting: 23.0  Smokeless Tobacco Never  Tobacco Comments   quit in 2001 2-2.5 a day    Goals Met:  Independence with exercise equipment Exercise tolerated well No report of concerns or symptoms today Strength training completed today  Goals Unmet:  Not Applicable  Comments: Pt able to follow exercise prescription today without complaint.  Will continue to monitor for progression.    Dr. Bethann Punches is Medical Director for [facility] Cardiac Rehabilitation.  Dr. Vida Rigger is Medical Director for [facility] Pulmonary Rehabilitation.

## 2022-08-16 ENCOUNTER — Encounter: Payer: Medicaid Other | Admitting: *Deleted

## 2022-08-16 DIAGNOSIS — J8410 Pulmonary fibrosis, unspecified: Secondary | ICD-10-CM | POA: Diagnosis not present

## 2022-08-16 NOTE — Progress Notes (Signed)
Daily Session Note  Patient Details  Name: [name] [name] MRN: [id number] Date of Birth: Mar 06, 1961 Referring Provider:   Flowsheet Row Pulmonary Rehab from 04/05/2022 in [facility] Cardiac and Pulmonary Rehab  Referring Provider Francene Boyers       Encounter Date: 08/16/2022  Check In:  Session Check In - 08/16/22 1603       Check-In   Supervising physician immediately available to respond to emergencies See telemetry face sheet for immediately available ER MD    Location [facility]-Cardiac & Pulmonary Rehab    Staff Present Susann Givens, RN BSN;Joseph Reino [place], RCP,RRT,BSRT;Noah [place], [place], Exercise Physiologist    Virtual Visit No    Medication changes reported     No    Fall or balance concerns reported    No    Warm-up and Cool-down Performed on first and last piece of equipment    Resistance Training Performed Yes    VAD Patient? No    PAD/SET Patient? No      Pain Assessment   Currently in Pain? No/denies                Social History   Tobacco Use  Smoking Status Former   Packs/day: 2.00   Years: 27.00   Additional pack years: 0.00   Total pack years: 54.00   Types: Cigarettes   Quit date: 07/30/1999   Years since quitting: 23.0  Smokeless Tobacco Never  Tobacco Comments   quit in 2001 2-2.5 a day    Goals Met:  Independence with exercise equipment Exercise tolerated well No report of concerns or symptoms today Strength training completed today  Goals Unmet:  Not Applicable  Comments: Pt able to follow exercise prescription today without complaint.  Will continue to monitor for progression.    Dr. Bethann Punches is Medical Director for [facility] Cardiac Rehabilitation.  Dr. Vida Rigger is Medical Director for [facility] Pulmonary Rehabilitation.

## 2022-08-17 ENCOUNTER — Ambulatory Visit: Admit: 2022-08-17 | Discharge: 2022-08-18 | Payer: PRIVATE HEALTH INSURANCE

## 2022-08-18 ENCOUNTER — Encounter: Payer: Medicaid Other | Attending: Pulmonary Disease | Admitting: *Deleted

## 2022-08-18 DIAGNOSIS — J8410 Pulmonary fibrosis, unspecified: Secondary | ICD-10-CM

## 2022-08-18 NOTE — Progress Notes (Signed)
Daily Session Note  Patient Details  Name: [name] [name] MRN: [id number] Date of Birth: 06/15/1960 Referring Provider:   Flowsheet Row Pulmonary Rehab from 04/05/2022 in [facility] Cardiac and Pulmonary Rehab  Referring Provider Francene Boyers       Encounter Date: 08/18/2022  Check In:  Session Check In - 08/18/22 1559       Check-In   Supervising physician immediately available to respond to emergencies See telemetry face sheet for immediately available ER MD    Location [facility]-Cardiac & Pulmonary Rehab    Staff Present Susann Givens, RN [name], BS, ACSM CEP, Exercise Physiologist;Laureen Manson Passey, BS, RRT, CPFT    Virtual Visit No    Medication changes reported     No    Fall or balance concerns reported    No    Warm-up and Cool-down Performed on first and last piece of equipment    Resistance Training Performed Yes    VAD Patient? No    PAD/SET Patient? No      Pain Assessment   Currently in Pain? No/denies                Social History   Tobacco Use  Smoking Status Former   Packs/day: 2.00   Years: 27.00   Additional pack years: 0.00   Total pack years: 54.00   Types: Cigarettes   Quit date: 07/30/1999   Years since quitting: 23.0  Smokeless Tobacco Never  Tobacco Comments   quit in 2001 2-2.5 a day    Goals Met:  Independence with exercise equipment Exercise tolerated well No report of concerns or symptoms today Strength training completed today  Goals Unmet:  Not Applicable  Comments: Pt able to follow exercise prescription today without complaint.  Will continue to monitor for progression.    Dr. Bethann Punches is Medical Director for [facility] Cardiac Rehabilitation.  Dr. Vida Rigger is Medical Director for [facility] Pulmonary Rehabilitation.

## 2022-08-23 ENCOUNTER — Encounter (HOSPITAL_COMMUNITY): Payer: Self-pay

## 2022-08-23 ENCOUNTER — Encounter: Payer: Medicaid Other | Admitting: *Deleted

## 2022-08-23 DIAGNOSIS — J8410 Pulmonary fibrosis, unspecified: Secondary | ICD-10-CM | POA: Diagnosis not present

## 2022-08-23 NOTE — Progress Notes (Signed)
Daily Session Note  Patient Details  Name: RAVEEN STAMOS MRN: [id number] Date of Birth: [birth year] Referring Provider:   Flowsheet Row Pulmonary Rehab from 04/05/2022 in [facility] Cardiac and Pulmonary Rehab  Referring Provider Francene Boyers       Encounter Date: 08/23/2022  Check In:  Session Check In - 08/23/22 1603       Check-In   Supervising physician immediately available to respond to emergencies See telemetry face sheet for immediately available ER MD    Location [facility]-Cardiac & Pulmonary Rehab    Staff Present Susann Givens, RN BSN;Joseph Reino Kent, RCP,RRT,BSRT;Noah Bray, [place], Exercise Physiologist    Virtual Visit No    Medication changes reported     No    Fall or balance concerns reported    No    Warm-up and Cool-down Performed on first and last piece of equipment    Resistance Training Performed Yes    VAD Patient? No    PAD/SET Patient? No      Pain Assessment   Currently in Pain? No/denies                Social History   Tobacco Use  Smoking Status Former   Packs/day: 2.00   Years: 27.00   Additional pack years: 0.00   Total pack years: 54.00   Types: Cigarettes   Quit date: 07/30/1999   Years since quitting: 23.0  Smokeless Tobacco Never  Tobacco Comments   quit in 2001 2-2.5 a day    Goals Met:  Independence with exercise equipment Exercise tolerated well No report of concerns or symptoms today Strength training completed today  Goals Unmet:  Not Applicable  Comments: Pt able to follow exercise prescription today without complaint.  Will continue to monitor for progression.    Dr. Bethann Punches is Medical Director for [facility] Cardiac Rehabilitation.  Dr. Vida Rigger is Medical Director for [facility] Pulmonary Rehabilitation.

## 2022-08-25 ENCOUNTER — Encounter: Payer: Medicaid Other | Admitting: *Deleted

## 2022-08-25 DIAGNOSIS — J8410 Pulmonary fibrosis, unspecified: Secondary | ICD-10-CM

## 2022-08-25 NOTE — Progress Notes (Signed)
Daily Session Note  Patient Details  Name: [name] [name] MRN: [id number] Date of Birth: 09-Mar-1961 Referring Provider:   Flowsheet Row Pulmonary Rehab from 04/05/2022 in [facility] Cardiac and Pulmonary Rehab  Referring Provider Francene Boyers       Encounter Date: 08/25/2022  Check In:  Session Check In - 08/25/22 1557       Check-In   Supervising physician immediately available to respond to emergencies See telemetry face sheet for immediately available ER MD    Location [facility]-Cardiac & Pulmonary Rehab    Staff Present Susann Givens, RN [name], BS, ACSM CEP, Exercise Physiologist;Joseph Reino [place], [place]    Virtual Visit No    Medication changes reported     No    Fall or balance concerns reported    No    Warm-up and Cool-down Performed on first and last piece of equipment    Resistance Training Performed Yes    VAD Patient? No    PAD/SET Patient? No      Pain Assessment   Currently in Pain? No/denies                Social History   Tobacco Use  Smoking Status Former   Packs/day: 2.00   Years: 27.00   Additional pack years: 0.00   Total pack years: 54.00   Types: Cigarettes   Quit date: 07/30/1999   Years since quitting: 23.0  Smokeless Tobacco Never  Tobacco Comments   quit in 2001 2-2.5 a day    Goals Met:  Independence with exercise equipment Exercise tolerated well No report of concerns or symptoms today Strength training completed today  Goals Unmet:  Not Applicable  Comments: Pt able to follow exercise prescription today without complaint.  Will continue to monitor for progression.    Dr. Bethann Punches is Medical Director for [facility] Cardiac Rehabilitation.  Dr. Vida Rigger is Medical Director for [facility] Pulmonary Rehabilitation.

## 2022-08-30 MED ORDER — METOCLOPRAMIDE 5 MG/5 ML ORAL SOLUTION
3 refills | 0 days
Start: 2022-08-30 — End: ?

## 2022-08-31 ENCOUNTER — Ambulatory Visit: Admit: 2022-08-31 | Payer: PRIVATE HEALTH INSURANCE | Attending: Family | Primary: Family

## 2022-09-01 ENCOUNTER — Encounter: Payer: Medicaid Other | Admitting: *Deleted

## 2022-09-01 ENCOUNTER — Encounter: Payer: Self-pay | Admitting: *Deleted

## 2022-09-01 DIAGNOSIS — J8410 Pulmonary fibrosis, unspecified: Secondary | ICD-10-CM

## 2022-09-01 NOTE — Progress Notes (Signed)
Daily Session Note  Patient Details  Name: [name] [name] MRN: [id number] Date of Birth: [birth year] Referring Provider:   Flowsheet Row Pulmonary Rehab from 04/05/2022 in [facility] Cardiac and Pulmonary Rehab  Referring Provider Francene Boyers       Encounter Date: 09/01/2022  Check In:  Session Check In - 09/01/22 1621       Check-In   Supervising physician immediately available to respond to emergencies See telemetry face sheet for immediately available ER MD    Location [facility]-Cardiac & Pulmonary Rehab    Staff Present Susann Givens, RN Atilano Median, RN, ADN;Laureen Manson Passey, BS, RRT, CPFT    Virtual Visit No    Medication changes reported     No    Fall or balance concerns reported    No    Warm-up and Cool-down Performed on first and last piece of equipment    Resistance Training Performed Yes    VAD Patient? No    PAD/SET Patient? No      Pain Assessment   Currently in Pain? No/denies                Social History   Tobacco Use  Smoking Status Former   Packs/day: 2.00   Years: 27.00   Additional pack years: 0.00   Total pack years: 54.00   Types: Cigarettes   Quit date: 07/30/1999   Years since quitting: 23.1  Smokeless Tobacco Never  Tobacco Comments   quit in 2001 2-2.5 a day    Goals Met:  Independence with exercise equipment Exercise tolerated well No report of concerns or symptoms today Strength training completed today  Goals Unmet:  Not Applicable  Comments: Pt able to follow exercise prescription today without complaint.  Will continue to monitor for progression.    Dr. Bethann Punches is Medical Director for [facility] Cardiac Rehabilitation.  Dr. Vida Rigger is Medical Director for [facility] Pulmonary Rehabilitation.

## 2022-09-01 NOTE — Progress Notes (Signed)
Pulmonary Individual Treatment Plan  Patient Details  Name: [name] [name] MRN: [id number] Date of Birth: Aug 23, 1960 Referring Provider:   Flowsheet Row Pulmonary Rehab from 04/05/2022 in [facility] Cardiac and Pulmonary Rehab  Referring Provider [name] [name]       Initial Encounter Date:  Flowsheet Row Pulmonary Rehab from 04/05/2022 in [facility] Cardiac and Pulmonary Rehab  Date 04/05/22       Visit Diagnosis: Pulmonary fibrosis ([place])  Patient's Home Medications on Admission:  Current Outpatient Medications:    acetaminophen (TYLENOL) 325 MG tablet, Take 2 tablets (650 mg total) by mouth every 6 (six) hours as needed for mild pain or moderate pain (or Fever >/= 101)., Disp: 20 tablet, Rfl: 0   amLODipine (NORVASC) 10 MG tablet, Take 10 mg by mouth daily., Disp: , Rfl:    ARIPiprazole (ABILIFY) 5 MG tablet, Take 1 tablet (5 mg total) by mouth daily., Disp: 30 tablet, Rfl: 5   ascorbic acid (VITAMIN C) 500 MG tablet, Take 1 tablet (500 mg total) by mouth daily., Disp: 30 tablet, Rfl: 0   atorvastatin (LIPITOR) 80 MG tablet, Take 80 mg by mouth daily., Disp: , Rfl:    Budeson-Glycopyrrol-Formoterol (BREZTRI AEROSPHERE) 160-9-4.8 MCG/ACT AERO, Inhale 2 puffs into the lungs in the morning and at bedtime., Disp: 5.9 g, Rfl: 0   buprenorphine (BUTRANS) 10 MCG/HR PTWK, Place 1 patch onto the skin once a week., Disp: 4 patch, Rfl: 4   busPIRone (BUSPAR) 10 MG tablet, Take 1 tablet (10 mg total) by mouth 2 (two) times daily., Disp: 60 tablet, Rfl: 5   Calcium Citrate-Vitamin D3 (GNP CALCIUM CITRATE+D MAXIMUM) 315-6.25 MG-MCG TABS, Take 1 tablet by mouth daily., Disp: , Rfl:    Cholecalciferol (VITAMIN D) 125 MCG (5000 UT) CAPS, Take 1 capsule by mouth daily., Disp: , Rfl:    ELIQUIS 5 MG TABS tablet, TAKE 1 TABLET BY MOUTH TWICE DAILY (Patient taking differently: Take 5 mg by mouth 2 (two) times daily.), Disp: 56 tablet, Rfl: 0   famotidine (PEPCID) 20 MG tablet, Take 1 tablet (20 mg total) by mouth  daily., Disp: 30 tablet, Rfl: 0   FLUoxetine (PROZAC) 40 MG capsule, Take 1 capsule (40 mg total) by mouth daily., Disp: 30 capsule, Rfl: 5   folic acid (FOLVITE) 1 MG tablet, Take 1 mg by mouth daily. , Disp: , Rfl:    hydroxychloroquine (PLAQUENIL) 200 MG tablet, Take 200 mg by mouth 2 (two) times daily., Disp: , Rfl:    levothyroxine (SYNTHROID, LEVOTHROID) 88 MCG tablet, Take 88 mcg by mouth daily before breakfast., Disp: , Rfl:    metFORMIN (GLUCOPHAGE-XR) 500 MG 24 hr tablet, Take 500 mg by mouth daily., Disp: , Rfl:    methotrexate (RHEUMATREX) 2.5 MG tablet, Take 2.5 mg by mouth once a week., Disp: , Rfl:    metoCLOPramide (REGLAN) 5 MG/5ML solution, Take 5 mg by mouth 4 (four) times daily -  before meals and at bedtime., Disp: , Rfl:    metoprolol tartrate (LOPRESSOR) 25 MG tablet, Take 25 mg by mouth 2 (two) times daily., Disp: , Rfl:    pramipexole (MIRAPEX) 0.5 MG tablet, Take 0.5 mg by mouth at bedtime., Disp: , Rfl:    pregabalin (LYRICA) 100 MG capsule, Take 1 capsule (100 mg total) by mouth 3 (three) times daily., Disp: 90 capsule, Rfl: 5   promethazine (PHENERGAN) 12.5 MG tablet, Take 12.5 mg by mouth., Disp: , Rfl:    QUEtiapine (SEROQUEL) 300 MG tablet, TAKE ONE TABLET BY MOUTH  AT BEDTIME., Disp: 30 tablet, Rfl: 5   TRADJENTA 5 MG TABS tablet, Take 5 mg by mouth daily., Disp: , Rfl:    zinc sulfate 220 (50 Zn) MG capsule, Take 220 mg by mouth at bedtime. , Disp: , Rfl:    zolpidem (AMBIEN) 5 MG tablet, Take 1 tablet (5 mg total) by mouth at bedtime., Disp: 30 tablet, Rfl: 5  Past Medical History: Past Medical History:  Diagnosis Date   Abdominal pain 02/12/2019   Abdominal wall hernia 01/29/2013   Acute metabolic encephalopathy 07/08/2019   AMS (altered mental status) 12/28/2021   Anxiety    Arthritis    Rheumatoid   C. difficile colitis    Chronic diastolic heart failure (HCC)    COVID-19 03/23/2019   Diagnosed at [facility] (send-out) on 03/23/2019   Depression    Diabetes  mellitus    states no meds or diet restrictions  at present   Diastolic CHF (HCC)    Esophagitis    Fluid retention    GERD (gastroesophageal reflux disease)    Hiatal hernia    Hypertension    Hypokalemia due to loss of potassium 10/21/2015   Overview:  Associated with 3 weeks of diarrhea  And QT prolongation.   Hypothyroidism    IBS (irritable bowel syndrome)    Moderate episode of recurrent major depressive disorder (HCC) 06/03/2004   Morbid obesity (HCC)    MRSA (methicillin resistant Staphylococcus aureus) infection 11/2017   left inner thigh abcess   Neurogenic bladder    has pacemaker   Neuropathy    Obesity    Panic attacks    Pneumonia due to COVID-19 virus    Rheumatoid arthritis (HCC)    Sleep apnea    STATES SEVERE, CANT TOLERATE MASK- LAST STUDY YEARS AGO    Tobacco Use: Social History   Tobacco Use  Smoking Status Former   Packs/day: 2.00   Years: 27.00   Additional pack years: 0.00   Total pack years: 54.00   Types: Cigarettes   Quit date: 07/30/1999   Years since quitting: 23.1  Smokeless Tobacco Never  Tobacco Comments   quit in 2001 2-2.5 a day    Labs: Review Flowsheet  More data exists      Latest Ref Rng & Units 07/09/2019 07/10/2019 01/10/2020 12/27/2021 01/04/2022  Labs for ITP Cardiac and Pulmonary Rehab  Hemoglobin A1c 4.8 - 5.6 % - - 10.8  - 6.8   PH, Arterial 7.350 - 7.450 7.38  7.48  - - -  PCO2 arterial 32.0 - 48.0 mmHg 36  25  - - -  Bicarbonate 20.0 - 28.0 mmol/L 21.3  18.6  - 27.2  -  Acid-base deficit 0.0 - 2.0 mmol/L 3.3  3.3  - - -  O2 Saturation % 96.6  97.3  - 65.4  -     Pulmonary Assessment Scores:  Pulmonary Assessment Scores     Row Name 04/05/22 1713         ADL UCSD   ADL Phase Entry     SOB Score total 81     Rest 2     Walk 3     Stairs 5     Bath 4     Dress 3     Shop 3       CAT Score   CAT Score 22       mMRC Score   mMRC Score 2  UCSD: Self-administered rating of dyspnea  associated with activities of daily living (ADLs) 6-point scale (0 = "not at all" to 5 = "maximal or unable to do because of breathlessness")  Scoring Scores range from 0 to 120.  Minimally important difference is 5 units  CAT: CAT can identify the health impairment of COPD patients and is better correlated with disease progression.  CAT has a scoring range of zero to 40. The CAT score is classified into four groups of low (less than 10), medium (10 - 20), high (21-30) and very high (31-40) based on the impact level of disease on health status. A CAT score over 10 suggests significant symptoms.  A worsening CAT score could be explained by an exacerbation, poor medication adherence, poor inhaler technique, or progression of COPD or comorbid conditions.  CAT MCID is 2 points  mMRC: mMRC (Modified Medical Research Council) Dyspnea Scale is used to assess the degree of baseline functional disability in patients of respiratory disease due to dyspnea. No minimal important difference is established. A decrease in score of 1 point or greater is considered a positive change.   Pulmonary Function Assessment:  Pulmonary Function Assessment - 03/31/22 1422       Breath   Shortness of Breath Yes;Limiting activity;Panic with Shortness of Breath             Exercise Target Goals: Exercise Program Goal: Individual exercise prescription set using results from initial 6 min walk test and THRR while considering  patient's activity barriers and safety.   Exercise Prescription Goal: Initial exercise prescription builds to 30-45 minutes a day of aerobic activity, 2-3 days per week.  Home exercise guidelines will be given to patient during program as part of exercise prescription that the participant will acknowledge.  Education: Aerobic Exercise: - Group verbal and visual presentation on the components of exercise prescription. Introduces F.I.T.T principle from ACSM for exercise prescriptions.  Reviews  F.I.T.T. principles of aerobic exercise including progression. Written material given at graduation.   Education: Resistance Exercise: - Group verbal and visual presentation on the components of exercise prescription. Introduces F.I.T.T principle from ACSM for exercise prescriptions  Reviews F.I.T.T. principles of resistance exercise including progression. Written material given at graduation.    Education: Exercise & Equipment Safety: - Individual verbal instruction and demonstration of equipment use and safety with use of the equipment. Flowsheet Row Pulmonary Rehab from 04/05/2022 in [facility] Cardiac and Pulmonary Rehab  Date 04/05/22  Educator [place]  Instruction Review Code 1- Verbalizes Understanding       Education: Exercise Physiology & General Exercise Guidelines: - Group verbal and written instruction with models to review the exercise physiology of the cardiovascular system and associated critical values. Provides general exercise guidelines with specific guidelines to those with heart or lung disease.    Education: Flexibility, Balance, Mind/Body Relaxation: - Group verbal and visual presentation with interactive activity on the components of exercise prescription. Introduces F.I.T.T principle from ACSM for exercise prescriptions. Reviews F.I.T.T. principles of flexibility and balance exercise training including progression. Also discusses the mind body connection.  Reviews various relaxation techniques to help reduce and manage stress (i.e. Deep breathing, progressive muscle relaxation, and visualization). Balance handout provided to take home. Written material given at graduation.   Activity Barriers & Risk Stratification:  Activity Barriers & Cardiac Risk Stratification - 04/05/22 1702       Activity Barriers & Cardiac Risk Stratification   Activity Barriers Shortness of Breath;History of Falls;Deconditioning;Assistive Device;Balance Concerns;Joint Problems;Arthritis  6 Minute Walk:  6 Minute Walk     Row Name 04/05/22 1659         6 Minute Walk   Phase Initial     Distance 530 feet     Walk Time 6 minutes     # of Rest Breaks 0     MPH 1     METS 1     RPE 15     Perceived Dyspnea  3     VO2 Peak 2     Symptoms Yes (comment)     Comments SOB     Resting HR 62 bpm     Resting BP 120/70     Resting Oxygen Saturation  95 %     Exercise Oxygen Saturation  during 6 min walk 91 %     Max Ex. HR 77 bpm     Max Ex. BP 130/70     2 Minute Post BP 118/82       Interval HR   1 Minute HR 69     2 Minute HR 75     3 Minute HR 77     4 Minute HR 77     5 Minute HR 77     6 Minute HR 73     2 Minute Post HR 69     Interval Heart Rate? Yes       Interval Oxygen   Interval Oxygen? Yes     Baseline Oxygen Saturation % 95 %     1 Minute Oxygen Saturation % 95 %     1 Minute Liters of Oxygen 3 L     2 Minute Oxygen Saturation % 91 %     2 Minute Liters of Oxygen 3 L     3 Minute Oxygen Saturation % 91 %     3 Minute Liters of Oxygen 3 L     4 Minute Oxygen Saturation % 92 %     4 Minute Liters of Oxygen 3 L     5 Minute Oxygen Saturation % 91 %     5 Minute Liters of Oxygen 3 L     6 Minute Oxygen Saturation % 92 %     6 Minute Liters of Oxygen 3 L     2 Minute Post Oxygen Saturation % 97 %     2 Minute Post Liters of Oxygen 3 L             Oxygen Initial Assessment:  Oxygen Initial Assessment - 07/05/22 1646       Home Oxygen   Home Oxygen Device Home Concentrator;E-Tanks;Portable Concentrator    Sleep Oxygen Prescription Continuous    Liters per minute 4    Home Exercise Oxygen Prescription Continuous    Liters per minute 4    Liters per minute 4    Compliance with Home Oxygen Use Yes      Program Oxygen Prescription   Program Oxygen Prescription Continuous    Liters per minute 4      Intervention   Short Term Goals To learn and exhibit compliance with exercise, home and travel O2 prescription;To learn and  understand importance of maintaining oxygen saturations>88%;To learn and demonstrate proper use of respiratory medications;To learn and understand importance of monitoring SPO2 with pulse oximeter and demonstrate accurate use of the pulse oximeter.;To learn and demonstrate proper pursed lip breathing techniques or other breathing techniques.     Long  Term Goals Verbalizes importance of  monitoring SPO2 with pulse oximeter and return demonstration;Exhibits proper breathing techniques, such as pursed lip breathing or other method taught during program session;Exhibits compliance with exercise, home  and travel O2 prescription;Maintenance of O2 saturations>88%;Compliance with respiratory medication             Oxygen Re-Evaluation:  Oxygen Re-Evaluation     Row Name 04/07/22 1626 05/05/22 1619 05/24/22 1606 06/28/22 1613 07/05/22 1646     Program Oxygen Prescription   Program Oxygen Prescription -- Continuous Continuous Continuous --   Liters per minute -- 3 3 4  --     Home Oxygen   Home Oxygen Device -- Home Concentrator;E-Tanks;Portable Concentrator Home Concentrator;E-Tanks;Portable Concentrator Home Concentrator;E-Tanks;Portable Concentrator --   Sleep Oxygen Prescription -- Continuous Continuous Continuous --   Liters per minute -- 3 3 4  --   Home Exercise Oxygen Prescription -- Continuous Continuous Continuous --   Liters per minute -- 3 3 4  --   Home Resting Oxygen Prescription -- Continuous Continuous Continuous --   Liters per minute -- 3 3 4  --   Compliance with Home Oxygen Use -- Yes Yes Yes --     Goals/Expected Outcomes   Short Term Goals To learn and exhibit compliance with exercise, home and travel O2 prescription;To learn and understand importance of maintaining oxygen saturations>88%;To learn and demonstrate proper use of respiratory medications;To learn and understand importance of monitoring SPO2 with pulse oximeter and demonstrate accurate use of the pulse oximeter.;To  learn and demonstrate proper pursed lip breathing techniques or other breathing techniques.  To learn and understand importance of maintaining oxygen saturations>88%;To learn and understand importance of monitoring SPO2 with pulse oximeter and demonstrate accurate use of the pulse oximeter. Other -- --   Long  Term Goals Verbalizes importance of monitoring SPO2 with pulse oximeter and return demonstration;Exhibits proper breathing techniques, such as pursed lip breathing or other method taught during program session;Demonstrates proper use of MDI's;Compliance with respiratory medication;Maintenance of O2 saturations>88%;Exhibits compliance with exercise, home  and travel O2 prescription Maintenance of O2 saturations>88%;Verbalizes importance of monitoring SPO2 with pulse oximeter and return demonstration Other -- --   Comments Reviewed PLB technique with pt.  Talked about how it works and it's importance in maintaining their exercise saturations. She has a pulse oximeter to check her/his oxygen saturation at home. Informed and explained why it is important to have one. Reviewed that oxygen saturations should be 88 percent and above. Patient verbalizes understanding. Carla Drape has been checking her oxygen at home and she states it been in the 90s range. She has been practicing PLB to help with her breathing. She has no questions about her respiratory medications. Diaphragmatic and PLB breathing explained and performed with patient. Patient has a better understanding of how to do these exercises to help with breathing performance and relaxation. Patient performed breathing techniques adequately and to practice further at home. [name] [name] continues to monitor her O2 and HR at home. She knows when to rest and continues to practice PLB. She is staying compliant with medications and breathing techqniues. She ensures her O2 stays above 88%.   Goals/Expected Outcomes Short: Become more profiecient at using PLB.   Long: Become  independent at using PLB. Short: monitor oxygen at home with exertion. Long: maintain oxygen saturations above 88 percent independently. Short: continue to use PLB. Long: maintain oxygen and PLB independently. Short: practice PLB and diaphragmatic breathing at home. Long: Use PLB and diaphragmatic breathing independently post [employer]. Short: Continue to monitor O2 and HR at  home Long: Become efficient at PLB long-term, utilize other breathing techniques when needed    Row Name 07/19/22 1619 08/23/22 1606           Program Oxygen Prescription   Program Oxygen Prescription Continuous Continuous      Liters per minute 4 4        Home Oxygen   Home Oxygen Device Home Concentrator;E-Tanks;Portable Concentrator Home Concentrator;E-Tanks;Portable Concentrator      Sleep Oxygen Prescription Continuous Continuous      Liters per minute 4 4      Home Exercise Oxygen Prescription Continuous Continuous      Liters per minute 4 4      Home Resting Oxygen Prescription Continuous Continuous      Liters per minute 4 4      Compliance with Home Oxygen Use Yes Yes        Goals/Expected Outcomes   Short Term Goals To learn and demonstrate proper pursed lip breathing techniques or other breathing techniques.  To learn and exhibit compliance with exercise, home and travel O2 prescription      Long  Term Goals Exhibits proper breathing techniques, such as pursed lip breathing or other method taught during program session Exhibits compliance with exercise, home  and travel O2 prescription;Demonstrates proper use of MDI's      Comments Diaphragmatic and PLB breathing explained and performed with patient. Patient has a better understanding of how to do these exercises to help with breathing performance and relaxation. Patient performed breathing techniques adequately and to practice further at home. [name] [name] has been using her PLB at home. She still needs to work on using her PLB when exercising. She knows how to use  her oxygen machine and medications and has no questions about them.      Goals/Expected Outcomes Short: practice PLB and diaphragmatic breathing at home. Long: Use PLB and diaphragmatic breathing independently post [employer]. Short: use PLB when exercising. Long: maintain PLB on exertion.               Oxygen Discharge (Final Oxygen Re-Evaluation):  Oxygen Re-Evaluation - 08/23/22 1606       Program Oxygen Prescription   Program Oxygen Prescription Continuous    Liters per minute 4      Home Oxygen   Home Oxygen Device Home Concentrator;E-Tanks;Portable Concentrator    Sleep Oxygen Prescription Continuous    Liters per minute 4    Home Exercise Oxygen Prescription Continuous    Liters per minute 4    Home Resting Oxygen Prescription Continuous    Liters per minute 4    Compliance with Home Oxygen Use Yes      Goals/Expected Outcomes   Short Term Goals To learn and exhibit compliance with exercise, home and travel O2 prescription    Long  Term Goals Exhibits compliance with exercise, home  and travel O2 prescription;Demonstrates proper use of MDI's    Comments [name] [name] has been using her PLB at home. She still needs to work on using her PLB when exercising. She knows how to use her oxygen machine and medications and has no questions about them.    Goals/Expected Outcomes Short: use PLB when exercising. Long: maintain PLB on exertion.             Initial Exercise Prescription:  Initial Exercise Prescription - 04/05/22 1700       Date of Initial Exercise RX and Referring Provider   Date 04/05/22    Referring Provider [name] [name]  Oxygen   Oxygen Continuous    Liters 3    Maintain Oxygen Saturation 88% or higher      NuStep   Level 1    SPM 80    Minutes 15    METs 1      Biostep-RELP   Level 1    SPM 80    Minutes 15    METs 1      Track   Laps 5    Minutes 15    METs 1.27      Prescription Details   Frequency (times per week) 2    Duration Progress  to 30 minutes of continuous aerobic without signs/symptoms of physical distress      Intensity   THRR 40-80% of Max Heartrate 101-140    Ratings of Perceived Exertion 11-13    Perceived Dyspnea 0-4      Progression   Progression Continue to progress workloads to maintain intensity without signs/symptoms of physical distress.      Resistance Training   Training Prescription Yes    Weight 2    Reps 10-15             Perform Capillary Blood Glucose checks as needed.  Exercise Prescription Changes:   Exercise Prescription Changes     Row Name 04/05/22 1700 04/15/22 1100 05/12/22 1100 05/25/22 1500 06/08/22 1500     Response to Exercise   Blood Pressure (Admit) 120/70 118/64 118/78 122/72 118/64   Blood Pressure (Exercise) 130/70 124/60 122/82 130/68 124/70   Blood Pressure (Exit) 118/82 110/62 112/70 122/78 118/62   Heart Rate (Admit) 62 bpm 71 bpm 69 bpm 70 bpm 69 bpm   Heart Rate (Exercise) 77 bpm 84 bpm 116 bpm 74 bpm 100 bpm   Heart Rate (Exit) 73 bpm 76 bpm 81 bpm 72 bpm 75 bpm   Oxygen Saturation (Admit) 95 % 98 % 98 % 97 % 98 %   Oxygen Saturation (Exercise) 91 % 92 % 92 % 92 % 94 %   Oxygen Saturation (Exit) 97 % 96 % 97 % 93 % 96 %   Rating of Perceived Exertion (Exercise) 15 15 15 13 16    Perceived Dyspnea (Exercise) 3 3 3 1 3    Symptoms SOB SOB SOB SOB SOB   Comments 6 MWT results 1st full day of exercise -- -- --   Duration -- Progress to 30 minutes of  aerobic without signs/symptoms of physical distress Progress to 30 minutes of  aerobic without signs/symptoms of physical distress Progress to 30 minutes of  aerobic without signs/symptoms of physical distress Continue with 30 min of aerobic exercise without signs/symptoms of physical distress.   Intensity -- THRR unchanged THRR unchanged THRR unchanged THRR unchanged     Progression   Progression -- Continue to progress workloads to maintain intensity without signs/symptoms of physical distress. Continue to  progress workloads to maintain intensity without signs/symptoms of physical distress. Continue to progress workloads to maintain intensity without signs/symptoms of physical distress. Continue to progress workloads to maintain intensity without signs/symptoms of physical distress.   Average METs -- 2.07 1.76 2.01 1.63     Resistance Training   Training Prescription -- Yes Yes Yes Yes   Weight -- 2 lb 2 lb 2 lb 2 lb   Reps -- 10-15 10-15 10-15 10-15     Interval Training   Interval Training -- No No No No     Oxygen   Oxygen -- Continuous Continuous Continuous Continuous  Liters -- 3 3 3 3      Treadmill   MPH -- -- 0.6 0.4 0.7   Grade -- -- 0 0 0   Minutes -- -- 15 15 15    METs -- -- 1.5 1.31 1.5     NuStep   Level -- 2 2 3 3    Minutes -- 15 15 15 15    METs -- 2.6 2.1 2.4 --     Biostep-RELP   Level -- -- -- 1 1   Minutes -- -- -- 15 15   METs -- -- -- 3 2     Track   Laps -- 10 16 -- --   Minutes -- 15 15 -- --   METs -- 1.54 1.87 -- --     Oxygen   Maintain Oxygen Saturation -- 88% or higher -- 88% or higher 88% or higher    Row Name 06/23/22 1400 07/05/22 1600 07/06/22 1200 07/22/22 0900 08/04/22 1000     Response to Exercise   Blood Pressure (Admit) 126/64 -- 130/62 140/68 128/68   Blood Pressure (Exercise) 154/70 -- -- -- --   Blood Pressure (Exit) 110/62 -- 102/62 132/62 126/60   Heart Rate (Admit) 75 bpm -- 68 bpm 70 bpm 66 bpm   Heart Rate (Exercise) 78 bpm -- 70 bpm 88 bpm 75 bpm   Heart Rate (Exit) 76 bpm -- 65 bpm 70 bpm 66 bpm   Oxygen Saturation (Admit) 96 % -- 95 % 98 % 98 %   Oxygen Saturation (Exercise) 93 % -- 93 % 89 % 92 %   Oxygen Saturation (Exit) 98 % -- 97 % 98 % 97 %   Rating of Perceived Exertion (Exercise) 13 -- 13 13 15    Perceived Dyspnea (Exercise) 2 -- 2 3 2    Symptoms SOB -- SOB SOB SOB   Duration Continue with 30 min of aerobic exercise without signs/symptoms of physical distress. -- Continue with 30 min of aerobic exercise without  signs/symptoms of physical distress. Continue with 30 min of aerobic exercise without signs/symptoms of physical distress. Continue with 30 min of aerobic exercise without signs/symptoms of physical distress.   Intensity THRR unchanged -- THRR unchanged THRR unchanged THRR unchanged     Progression   Progression Continue to progress workloads to maintain intensity without signs/symptoms of physical distress. -- Continue to progress workloads to maintain intensity without signs/symptoms of physical distress. Continue to progress workloads to maintain intensity without signs/symptoms of physical distress. Continue to progress workloads to maintain intensity without signs/symptoms of physical distress.   Average METs 1.76 -- 1.66 1.91 1.77     Resistance Training   Training Prescription Yes -- Yes Yes Yes   Weight 2 lb -- 2 lb 2 lb 2 lb   Reps 10-15 -- 10-15 10-15 10-15     Interval Training   Interval Training No -- No No No     Oxygen   Oxygen Continuous -- Continuous Continuous Continuous   Liters 3-4 -- 4 4 4      Treadmill   MPH 0.7 -- 0.7 0.6 0.7   Grade 0 -- 0 0 0   Minutes 15 -- 15 15 15    METs 1.54 -- 1.54 1.46 1.54     NuStep   Level 3 -- 3 5 5    Minutes 15 -- 15 15 15    METs 1.5 -- 1.8 3.2 2     Biostep-RELP   Level 1 -- 2 -- --  Minutes 15 -- 15 -- --   METs 2 -- 2 -- --     Home Exercise Plan   Plans to continue exercise at -- Home (comment)  walking, staff videos, seated chair exercises, possibly going to [facility] (comment)  walking, staff videos, seated chair exercises, possibly going to [facility] (comment)  walking, staff videos, seated chair exercises, possibly going to [facility] (comment)  walking, staff videos, seated chair exercises, possibly going to [employer] -- Add 3 additional days to program exercise sessions.  start with 1 day, slowly build up to it Add 3 additional days to program exercise sessions.  start with 1 day, slowly build  up to it Add 3 additional days to program exercise sessions.  start with 1 day, slowly build up to it Add 3 additional days to program exercise sessions.  start with 1 day, slowly build up to it   Initial Home Exercises Provided -- 07/05/22 07/05/22 07/05/22 07/05/22     Oxygen   Maintain Oxygen Saturation 88% or higher 88% or higher 88% or higher 88% or higher 88% or higher    Row Name 08/18/22 1300 08/31/22 1600           Response to Exercise   Blood Pressure (Admit) 124/72 104/62      Blood Pressure (Exit) 110/62 100/60      Heart Rate (Admit) 71 bpm 60 bpm      Heart Rate (Exercise) 75 bpm 72 bpm      Heart Rate (Exit) 67 bpm 56 bpm      Oxygen Saturation (Admit) 97 % 98 %      Oxygen Saturation (Exercise) 95 % 93 %      Oxygen Saturation (Exit) 96 % 96 %      Rating of Perceived Exertion (Exercise) 12 15      Perceived Dyspnea (Exercise) 1 3      Symptoms SOB SOB      Duration Continue with 30 min of aerobic exercise without signs/symptoms of physical distress. Continue with 30 min of aerobic exercise without signs/symptoms of physical distress.      Intensity THRR unchanged THRR unchanged        Progression   Progression Continue to progress workloads to maintain intensity without signs/symptoms of physical distress. Continue to progress workloads to maintain intensity without signs/symptoms of physical distress.      Average METs 2 1.75        Resistance Training   Training Prescription Yes Yes      Weight 2 lb 2 lb      Reps 10-15 10-15        Interval Training   Interval Training No No        Oxygen   Oxygen Continuous Continuous      Liters 4 4        Treadmill   MPH 1 0.6      Grade 0 0      Minutes 15 15      METs 1.77 1.46        NuStep   Level 4 2      Minutes 15 15      METs 2.6 2.1        Home Exercise Plan   Plans to continue exercise at Home (comment)  walking, staff videos, seated chair exercises, possibly going to [facility] (comment)   walking, staff videos, seated chair exercises, possibly going to [employer]  Frequency Add 3 additional days to program exercise sessions.  start with 1 day, slowly build up to it Add 3 additional days to program exercise sessions.  start with 1 day, slowly build up to it      Initial Home Exercises Provided 07/05/22 07/05/22        Oxygen   Maintain Oxygen Saturation 88% or higher 88% or higher               Exercise Comments:   Exercise Comments     Row Name 04/07/22 1623           Exercise Comments First full day of exercise!  Patient was oriented to gym and equipment including functions, settings, policies, and procedures.  Patient's individual exercise prescription and treatment plan were reviewed.  All starting workloads were established based on the results of the 6 minute walk test done at initial orientation visit.  The plan for exercise progression was also introduced and progression will be customized based on patient's performance and goals.                Exercise Goals and Review:   Exercise Goals     Row Name 04/05/22 1709             Exercise Goals   Increase Physical Activity Yes       Intervention Provide advice, education, support and counseling about physical activity/exercise needs.;Develop an individualized exercise prescription for aerobic and resistive training based on initial evaluation findings, risk stratification, comorbidities and participant's personal goals.       Expected Outcomes Short Term: Attend rehab on a regular basis to increase amount of physical activity.;Long Term: Add in home exercise to make exercise part of routine and to increase amount of physical activity.;Long Term: Exercising regularly at least 3-5 days a week.       Increase Strength and Stamina Yes       Intervention Provide advice, education, support and counseling about physical activity/exercise needs.;Develop an individualized exercise prescription for aerobic and  resistive training based on initial evaluation findings, risk stratification, comorbidities and participant's personal goals.       Expected Outcomes Short Term: Increase workloads from initial exercise prescription for resistance, speed, and METs.;Short Term: Perform resistance training exercises routinely during rehab and add in resistance training at home;Long Term: Improve cardiorespiratory fitness, muscular endurance and strength as measured by increased METs and functional capacity ( )       Able to understand and use rate of perceived exertion (RPE) scale Yes       Intervention Provide education and explanation on how to use RPE scale       Expected Outcomes Short Term: Able to use RPE daily in rehab to express subjective intensity level;Long Term:  Able to use RPE to guide intensity level when exercising independently       Able to understand and use Dyspnea scale Yes       Intervention Provide education and explanation on how to use Dyspnea scale       Expected Outcomes Short Term: Able to use Dyspnea scale daily in rehab to express subjective sense of shortness of breath during exertion;Long Term: Able to use Dyspnea scale to guide intensity level when exercising independently       Knowledge and understanding of Target Heart Rate Range (THRR) Yes       Intervention Provide education and explanation of THRR including how the numbers were predicted and where they are located  for reference       Expected Outcomes Short Term: Able to state/look up THRR;Long Term: Able to use THRR to govern intensity when exercising independently;Short Term: Able to use daily as guideline for intensity in rehab       Able to check pulse independently Yes       Intervention Provide education and demonstration on how to check pulse in carotid and radial arteries.;Review the importance of being able to check your own pulse for safety during independent exercise       Expected Outcomes Short Term: Able to explain  why pulse checking is important during independent exercise;Long Term: Able to check pulse independently and accurately       Understanding of Exercise Prescription Yes       Intervention Provide education, explanation, and written materials on patient's individual exercise prescription       Expected Outcomes Short Term: Able to explain program exercise prescription;Long Term: Able to explain home exercise prescription to exercise independently                Exercise Goals Re-Evaluation :  Exercise Goals Re-Evaluation     Row Name 04/07/22 1624 04/15/22 1150 04/27/22 1346 05/12/22 1111 05/25/22 1515     Exercise Goal Re-Evaluation   Exercise Goals Review Increase Physical Activity;Able to understand and use rate of perceived exertion (RPE) scale;Knowledge and understanding of Target Heart Rate Range (THRR);Understanding of Exercise Prescription;Able to understand and use Dyspnea scale;Able to check pulse independently;Increase Strength and Stamina Increase Physical Activity;Increase Strength and Stamina;Understanding of Exercise Prescription Increase Physical Activity;Increase Strength and Stamina;Understanding of Exercise Prescription Increase Physical Activity;Increase Strength and Stamina;Understanding of Exercise Prescription Increase Physical Activity;Increase Strength and Stamina;Understanding of Exercise Prescription   Comments Reviewed RPE scale, THR and program prescription with pt today.  Pt voiced understanding and was given a copy of goals to take home. [name] [name] completed her first rehab session and did well. She was able to do a full 10 laps on the track, which is double her initial exercise prescription! Walking is challenging for her as she had to take several breaks so we hope to see that improve over time. We will continue to monitor as she progresses in the program. [name] [name] returned to rehab on 04/26/2022 after not attending since the last review. We will encourage her to stay  consistent with her attendance so that she can see greater benefits from the program. We will continue to monitor her progress. [name] [name] is back into routine with rehab again. She tried out the treadmill and feels more stable on there as she can hang on, and was able to walk at a 0.6 mph. She also walked the track as well and increased from 10 to 16 laps! Her dyspnea continues to average 2-3 and hope to see that improve overtime. Oxygen saturations are staying above 88%. Will continue to monitor. [name] [name] has only attended rehab twice since the last review. She was able to increase her overall average MET level back above 2 METs. She also improved to level 3 on the T4 Nustep. However, her speed on the treadmill decreased from 0.6 mph to 0.4 mph. We will continue to monitor her progress in the program.   Expected Outcomes Short: Use RPE daily to regulate intensity.  Long: Follow program prescription in THR. Short: Continue initial exercise prescription Long: Increase overall MET level Short: Return to regular attendance in the program. Long: Continue to follow exercise prescription. Short: Continue to increase speed on treadmill  progressively Long: Increase overall MET level and stamina Short: Continue to progressively increase speed on treadmill. Long: Continue to improve strength and stamina.    Row Name 06/08/22 1512 06/23/22 1428 07/05/22 1629 07/06/22 1258 07/19/22 1617     Exercise Goal Re-Evaluation   Exercise Goals Review Increase Physical Activity;Increase Strength and Stamina;Understanding of Exercise Prescription Increase Physical Activity;Increase Strength and Stamina;Understanding of Exercise Prescription Increase Physical Activity;Increase Strength and Stamina;Understanding of Exercise Prescription;Able to understand and use Dyspnea scale;Able to check pulse independently;Able to understand and use rate of perceived exertion (RPE) scale;Knowledge and understanding of Target Heart Rate Range (THRR)  Increase Physical Activity;Increase Strength and Stamina;Understanding of Exercise Prescription Increase Physical Activity;Increase Strength and Stamina   Comments [name] [name] has only attended rehab 2 times since last review as she has been out sick. She was able to increase to 0.7 mph on the treadmill which is the highest she has been yet. She would definitely get more benefit attending more sessions consistently. Her dyspnea still ranges between 2-3 and we hope to see that improve over time. Will continue to monitor. [name] [name] has only attended rehab twice since the last review. She has continued to walk at a speed of 0.7 mph due to her saturations dropping and SOB. She has also stayed consistent with level 1 on the biostep and level 3 on the T4 nustep. She has tolerated 2 lb hand weights for resistance training as well. We will continue to monitor her progress in the program. Reviewed home exercise with pt today.  Pt plans to seated chair exercises for exercise. Provided staff videos and seated Youtube videos to complete.  Patient plans to walk but is only comfortable doing so on a treadmill as she has something to hang on to. She also knows to always use her cane and/or walker for assistance as she does have balance problems. Gave her [employer]. She experiences flare ups with gastroporesis and is limited by good and bad days. Exercise when she feels her best. Reviewed THR, pulse, RPE, sign and symptoms, pulse oximetery and when to call 911 or MD.  Also discussed weather considerations and indoor options.  Pt voiced understanding. [name] [name] has not been coming to rehab consistently but to having flare ups with gastroparesis. She has stated consistent at a 0.7 speed on the treadmill, however, has noticed she hasn't been needing to stop and can walk the full 20 minutes. Her o2 saturations are staying above 88%. We will encourage her to increase to 3 lbs for handweights. Will continue to monitor. [name] [name] states that  she is going to walk at home when she is done with the program. She is going to walk at home about three days a week.   Expected Outcomes Short: Attend more consistently once feeling better Long: Continue to increase overall MET level and stamina Short: Attend rehab consistently, progressively increase treadmill workload. Long: Continue to improve strength and stamina. Short: Start with slow structured exercise at home, try out a seated exercise on her good days Long: Continue to exercise independently at appropriate prescription Short: Increase to 3 lb handweights Long: Increase overall MET level and stamina Short: graduate [facility]. Long: maintain exercise independently.    Row Name 07/22/22 0454 08/04/22 1025 08/18/22 1359 08/31/22 1609       Exercise Goal Re-Evaluation   Exercise Goals Review Increase Physical Activity;Increase Strength and Stamina;Understanding of Exercise Prescription Increase Physical Activity;Increase Strength and Stamina;Understanding of Exercise Prescription Increase Physical Activity;Increase Strength and Stamina;Understanding of Exercise Prescription  Increase Physical Activity;Increase Strength and Stamina;Understanding of Exercise Prescription    Comments [name] [name] is doing well in the program. She recently has worked at an average of 1.91 METs. She also was able to improve to level 5 on the T4 nustep. Her speed on the treadmill did go down from 0.7 to 0.6 mph. We will continue to monitor her progress in the program. [name] [name] had came to 2 sessions since last review- her attendance has been a little sporadic due to gastroparesis side effects. She has stayed consistent at 0.6-0.7 mph on the treadmill. She is consistent at level 5 on the T4 Nustep as well. She is not quite hitting her THR as her dyspnea and SOB continue to be a barrier. She could benefit from increasing to 3 lb for handweights. Will continue to monitor. [name] [name] is doing well in rehab. She did increase her speed on the  treadmill to 1.0 mph! That is her highest yet. She used to do level 5 on the T4 and went down to level 4 last time, staff will encourage patient to increase that back up. Her oxygen remains above 88% during exercise. She is limited doing the T4 and TM primarily. [name] [name] is doing well in rehab. Her speed on the treadmill fluctuates based on her breathing feels that day. She ranges between 0.6-1.0 mph. Her level on the T4 Nustep is back down to 2, and we will encourage her to increase that back up. She is due for a post next week and hope to see improvement. Will continue to monitor.    Expected Outcomes Short: Increase speed back up on the treadmill. Long: Continue to improve stamina. Short: Increase to 3 lb handweights Long: Continue to increase overall MET level and stamina Short: Continue to increase treadmill speed slowly, strive for 1.1 mph Long: Continue to increase overall MET level and stamina Short: Improve on post Long: Continue to build up overall strength and stamina             Discharge Exercise Prescription (Final Exercise Prescription Changes):  Exercise Prescription Changes - 08/31/22 1600       Response to Exercise   Blood Pressure (Admit) 104/62    Blood Pressure (Exit) 100/60    Heart Rate (Admit) 60 bpm    Heart Rate (Exercise) 72 bpm    Heart Rate (Exit) 56 bpm    Oxygen Saturation (Admit) 98 %    Oxygen Saturation (Exercise) 93 %    Oxygen Saturation (Exit) 96 %    Rating of Perceived Exertion (Exercise) 15    Perceived Dyspnea (Exercise) 3    Symptoms SOB    Duration Continue with 30 min of aerobic exercise without signs/symptoms of physical distress.    Intensity THRR unchanged      Progression   Progression Continue to progress workloads to maintain intensity without signs/symptoms of physical distress.    Average METs 1.75      Resistance Training   Training Prescription Yes    Weight 2 lb    Reps 10-15      Interval Training   Interval  Training No      Oxygen   Oxygen Continuous    Liters 4      Treadmill   MPH 0.6    Grade 0    Minutes 15    METs 1.46      NuStep   Level 2    Minutes 15    METs 2.1  Home Exercise Plan   Plans to continue exercise at Home (comment)   walking, staff videos, seated chair exercises, possibly going to [employer] Add 3 additional days to program exercise sessions.   start with 1 day, slowly build up to it   Initial Home Exercises Provided 07/05/22      Oxygen   Maintain Oxygen Saturation 88% or higher             Nutrition:  Target Goals: Understanding of nutrition guidelines, daily intake of sodium 1500mg , cholesterol 200mg , calories 30% from fat and 7% or less from saturated fats, daily to have 5 or more servings of fruits and vegetables.  Education: All About Nutrition: -Group instruction provided by verbal, written material, interactive activities, discussions, models, and posters to present general guidelines for heart healthy nutrition including fat, fiber, MyPlate, the role of sodium in heart healthy nutrition, utilization of the nutrition label, and utilization of this knowledge for meal planning. Follow up email sent as well. Written material given at graduation.   Biometrics:  Pre Biometrics - 04/05/22 1710       Pre Biometrics   Height 5' 6.75" (1.695 m)    Weight 318 lb 1.6 oz (144.3 kg)    BMI (Calculated) 50.22    Single Leg Stand 0 seconds              Nutrition Therapy Plan and Nutrition Goals:  Nutrition Therapy & Goals - 04/05/22 1546       Nutrition Therapy   Diet Heart healthy, low Na, T2DM MNT    Drug/Food Interactions Statins/Certain Fruits    Protein (specify units) 110g    Fiber 25 grams    Whole Grain Foods 3 servings    Saturated Fats 12 max. grams    Fruits and Vegetables 8 servings/day    Sodium 2 grams      Personal Nutrition Goals   Nutrition Goal ST: limit sodium/read food labels, practice MyPlate  guidelines, cut down more on regular soda, and swap out her bacon grease and butter for liquid plant oils when cooking. LT: limit Na <2g/day, limit added sugar <24g/day, limit saturated fat <12g/day, follow MyPlate guidelines.    Comments 62 y.o. F admitted to pulmonary rehab for pulmonary fibrosis. PMHx includes HTN, HLD, CHF, PAF, COPD, gatroparesis, hypothyroidism, T2DM, rheumatoid arthritis, former smoker, anxiety/depression, bipolar disorder. Relevant medications reviewed 04/05/22. PYP Score: 44. Vegetables & Fruits 9/12. Breads, Grains & Cereals 7/12. Red & Processed Meat 7/12. Poultry 2/2. Fish & Shellfish 1/4. Beans, Nuts & Seeds 1/4. Milk & Dairy Foods 1/6. Toppings, Oils, Seasonings & Salt 3/20. Sweets, Snacks & Restaurant Food 8/14. Beverages 5/10. [name] [name] feels that her downfall is regular soda - 35 oz/day - she has cut down, she used to drink 2L/day; she continues to cut back her soda with body armor lyte (no added sugar) B: buttered toast (white bread) L: does not usually eat lunch D: cube steak and gravy, pork chops, chicken, spaghetti and vegetables: green beans, turnip greens, corn, green peas. [name] [name] reports using salt with cooking and will normally shake it into her food. [name] [name] reports using bacon grease or butter when she cooks. [name] [name] reports eating out 3x/week. She has seen providers previously about nutrition and feels she has a good knowledge base - reviewed MyPlate, T2DM MNT, and general heart healthy eating. Encouraged to limit sodium/read food labels, practice MyPlate guidelines, cut down more on regular soda, and swap out her bacon grease and butter for  liquid plant oils when cooking.      Intervention Plan   Intervention Prescribe, educate and counsel regarding individualized specific dietary modifications aiming towards targeted core components such as weight, hypertension, lipid management, diabetes, heart failure and other comorbidities.;Nutrition handout(s) given to patient.     Expected Outcomes Short Term Goal: Understand basic principles of dietary content, such as calories, fat, sodium, cholesterol and nutrients.;Short Term Goal: A plan has been developed with personal nutrition goals set during dietitian appointment.;Long Term Goal: Adherence to prescribed nutrition plan.             Nutrition Assessments:  MEDIFICTS Score Key: ?70 Need to make dietary changes  40-70 Heart Healthy Diet ? 40 Therapeutic Level Cholesterol Diet  Flowsheet Row Pulmonary Rehab from 04/05/2022 in [facility]  Picture Your Plate Total Score on Admission 44      Picture Your Plate Scores: <16 Unhealthy dietary pattern with much room for improvement. 41-50 Dietary pattern unlikely to meet recommendations for good health and room for improvement. 51-60 More healthful dietary pattern, with some room for improvement.  >60 Healthy dietary pattern, although there may be some specific behaviors that could be improved.   Nutrition Goals Re-Evaluation:  Nutrition Goals Re-Evaluation     Row Name 05/05/22 1623 05/24/22 1612 06/28/22 1620 07/05/22 1641 08/23/22 1615     Goals   Current Weight 323 lb (146.5 kg) 328 lb (148.8 kg) 330 lb (149.7 kg) -- 344 lb (156 kg)   Nutrition Goal eat smaller portions Eat smaller frequent meals Eat more frequent meals. ST: limit sodium/read food labels, practice MyPlate guidelines, cut down more on regular soda, and swap out her bacon grease and butter for liquid plant oils when cooking. LT: limit Na <2g/day, limit added sugar <24g/day, limit saturated fat <12g/day, follow MyPlate guidelines. Lose some weight.   Comment Patient was informed on why it is important to maintain a balanced diet when dealing with Respiratory issues. Explained that it takes a lot of energy to breath and when they are short of breath often they will need to have a good diet to help keep up with the calories they are expending for breathing. [name] [name] states  that she sometimes eats once a day. She mostly eats at dinner time. Informed her that it would be benificial for her to eat smaller frequent meals to help aid in weight loss and appetite. She says that it is hard to get in that habit of eating. [name] [name] states she eats cubed steak with gravy and green beans, another night would be Hot dogs or something. Informed her that if she is eating alot of sodium filled foods that it could cause her to retain fluid. [name] [name] states she has reduced her  sodaintake and instead drinking lemon water.  She has not used bacon greese, is now using canola oil. May try olive oil as well. Has not eated any fried food which she states is hard for her. She is very limited with her diet right now as she gets flare ups with  gastroparesis- she takes medication for it and is calling her doctor again on what her next steps can be. She typically only eats 1-2 meals/ day. She gets her protein intake from meat and peanut butter. She knows she could benefit from eating more frequent meals. She is going to work on that next. She declines wanting to meet with the RD again. [name] [name] does not divuldge with what she eats. She states she  does not snack or have much of an appetite Informed her to talk with her doctor about weight loss and what she can do.   Expected Outcome Short: Choose and plan snacks accordingly to patients caloric intake to improve breathing. Long: Maintain a diet independently that meets their caloric intake to aid in daily shortness of breath. Short: eat smaller more frequent meals. Long: adhere to a diet that pertains to her. Short: eat smaller more frequent meals. Long: adhere to a diet that pertains to her. Short: Continue to work on [employer], continue to eliminate soda and bacon grease Long: Continue to eat healthy pulmonary based diet Short: eat smaller more frequent meals, Speak with her doctor on diet. Long: reach weight goal and adhere to a diet plan that pertains  to her.            Nutrition Goals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 on EP and RD appointment and 6 Minute walk test. Patient also informed of patient health questionnaires on My Chart. Patient Verbalizes understanding. Visit diagnosis can be found in [facility] 03/29/2022. Completed and gym orientation. Initial ITP created and sent for review to Dr. Jinny Sanders, Medical Director. First full day of exercise!  Patient was oriented to gym and equipment including functions, settings, policies, and procedures.  Patient's individual exercise prescription and treatment plan were reviewed.  All starting workloads were established based on the results of the 6 minute walk test  done at initial orientation visit.  The plan for exercise progression was also introduced and progression will be customized based on patient's performance and goals. 30 Day review completed. Medical Director ITP review done, changes made as directed, and signed approval by Medical Director.    new to program 30 Day review completed. Medical Director ITP review done, changes made as  directed, and signed approval by Medical Director.    Row Name 06/09/22 1217 07/07/22 0806 08/04/22 0829 09/01/22 0814     ITP Comments 30 day review completed. ITP sent to Dr. Jinny Sanders, Medical Director of  Pulmonary Rehab. Continue with ITP unless changes are made by physician. 30 Day review completed. Medical Director ITP review done, changes made as directed, and signed approval by Medical Director. 30 day review completed. ITP sent to Dr. Jinny Sanders, Medical Director of  Pulmonary Rehab. Continue with ITP unless changes are made by physician. 30 Day review completed. Medical Director ITP review done, changes made as directed, and signed approval by Medical Director.             Comments:

## 2022-09-06 MED ORDER — FLUOXETINE 40 MG CAPSULE
ORAL_CAPSULE | Freq: Every day | ORAL | 3 refills | 30 days
Start: 2022-09-06 — End: ?

## 2022-09-08 ENCOUNTER — Encounter: Payer: Medicaid Other | Admitting: *Deleted

## 2022-09-08 DIAGNOSIS — J8410 Pulmonary fibrosis, unspecified: Secondary | ICD-10-CM | POA: Diagnosis not present

## 2022-09-08 NOTE — Progress Notes (Signed)
Daily Session Note  Patient Details  Name: DELANIE WAGG MRN: [id number] Date of Birth: [birth year] Referring Provider:   Flowsheet Row Pulmonary Rehab from 04/05/2022 in [facility] Cardiac and Pulmonary Rehab  Referring Provider Francene Boyers       Encounter Date: 09/08/2022  Check In:  Session Check In - 09/08/22 1623       Check-In   Supervising physician immediately available to respond to emergencies See telemetry face sheet for immediately available ER MD    Location [facility]-Cardiac & Pulmonary Rehab    Staff Present Elige Ko, Guinevere Ferrari, RN, Eben Burow RN, BSN    Virtual Visit No    Medication changes reported     No    Fall or balance concerns reported    No    Warm-up and Cool-down Performed on first and last piece of equipment    Resistance Training Performed Yes    VAD Patient? No    PAD/SET Patient? No      Pain Assessment   Currently in Pain? No/denies                Social History   Tobacco Use  Smoking Status Former   Packs/day: 2.00   Years: 27.00   Additional pack years: 0.00   Total pack years: 54.00   Types: Cigarettes   Quit date: 07/30/1999   Years since quitting: 23.1  Smokeless Tobacco Never  Tobacco Comments   quit in 2001 2-2.5 a day    Goals Met:  Independence with exercise equipment Exercise tolerated well No report of concerns or symptoms today Strength training completed today  Goals Unmet:  Not Applicable  Comments: Pt able to follow exercise prescription today without complaint.  Will continue to monitor for progression.    Dr. Bethann Punches is Medical Director for [facility] Cardiac Rehabilitation.  Dr. Vida Rigger is Medical Director for [facility] Pulmonary Rehabilitation.

## 2022-09-09 ENCOUNTER — Encounter: Payer: Medicaid Other | Admitting: Student in an Organized Health Care Education/Training Program

## 2022-09-10 DIAGNOSIS — M0579 Rheumatoid arthritis with rheumatoid factor of multiple sites without organ or systems involvement: Principal | ICD-10-CM

## 2022-09-10 MED ORDER — METHOTREXATE SODIUM 2.5 MG TABLET
ORAL_TABLET | ORAL | 2 refills | 28 days | Status: CP
Start: 2022-09-10 — End: ?

## 2022-09-14 ENCOUNTER — Encounter: Payer: Medicaid Other | Admitting: Student in an Organized Health Care Education/Training Program

## 2022-09-14 MED ORDER — PRAMIPEXOLE 0.125 MG TABLET
ORAL_TABLET | Freq: Every evening | ORAL | 2 refills | 30 days | Status: CP
Start: 2022-09-14 — End: 2023-09-14

## 2022-09-16 DIAGNOSIS — M0579 Rheumatoid arthritis with rheumatoid factor of multiple sites without organ or systems involvement: Principal | ICD-10-CM

## 2022-09-16 MED ORDER — HYDROXYCHLOROQUINE 200 MG TABLET
ORAL_TABLET | Freq: Two times a day (BID) | ORAL | 3 refills | 90 days | Status: CP
Start: 2022-09-16 — End: ?

## 2022-09-22 ENCOUNTER — Encounter: Payer: Medicaid Other | Attending: Pulmonary Disease | Admitting: *Deleted

## 2022-09-22 VITALS — Ht 66.75 in | Wt 338.0 lb

## 2022-09-22 DIAGNOSIS — J8410 Pulmonary fibrosis, unspecified: Secondary | ICD-10-CM | POA: Diagnosis present

## 2022-09-22 NOTE — Progress Notes (Signed)
Daily Session Note  Patient Details  Name: [name] [name] MRN: [id number] Date of Birth: 1960/07/07 Referring Provider:   Flowsheet Row Pulmonary Rehab from 04/05/2022 in [facility] Cardiac and Pulmonary Rehab  Referring Provider Francene Boyers       Encounter Date: 09/22/2022  Check In:  Session Check In - 09/22/22 1651       Check-In   Supervising physician immediately available to respond to emergencies See telemetry face sheet for immediately available ER MD    Location [facility]-Cardiac & Pulmonary Rehab    Staff Present Bess Kinds RN, BSN;Susanne Bice, RN, BSN, Matthew Saras, BS, ACSM CEP, Exercise Physiologist    Virtual Visit No    Fall or balance concerns reported    No    Warm-up and Cool-down Performed on first and last piece of equipment    Resistance Training Performed Yes    VAD Patient? No    PAD/SET Patient? No      Pain Assessment   Currently in Pain? No/denies                Social History   Tobacco Use  Smoking Status Former   Packs/day: 2.00   Years: 27.00   Additional pack years: 0.00   Total pack years: 54.00   Types: Cigarettes   Quit date: 07/30/1999   Years since quitting: 23.1  Smokeless Tobacco Never  Tobacco Comments   quit in 2001 2-2.5 a day    Goals Met:  Independence with exercise equipment Exercise tolerated well No report of concerns or symptoms today Strength training completed today  Goals Unmet:  Not Applicable  Comments:  [name] [name] graduated today from  rehab with 30 sessions completed.  Details of the patient's exercise prescription and what She needs to do in order to continue the prescription and progress were discussed with patient.  Patient was given a copy of prescription and goals.  Patient verbalized understanding.  [name] [name] plans to continue to exercise by continuing at home and possibly going to [employer].    Dr. Bethann Punches is Medical Director for [facility] Cardiac Rehabilitation.  Dr. Vida Rigger is Medical  Director for [facility] Pulmonary Rehabilitation.

## 2022-09-22 NOTE — Progress Notes (Signed)
Pulmonary Individual Treatment Plan  Patient Details  Name: [name] [name] MRN: [id number] Date of Birth: [birth year] Referring Provider:   Flowsheet Row Pulmonary Rehab from 04/05/2022 in [facility] Cardiac and Pulmonary Rehab  Referring Provider Francene Boyers       Initial Encounter Date:  Flowsheet Row Pulmonary Rehab from 04/05/2022 in [facility] Cardiac and Pulmonary Rehab  Date 04/05/22       Visit Diagnosis: Pulmonary fibrosis (HCC)  Patient's Home Medications on Admission:  Current Outpatient Medications:    acetaminophen (TYLENOL) 325 MG tablet, Take 2 tablets (650 mg total) by mouth every 6 (six) hours as needed for mild pain or moderate pain (or Fever >/= 101)., Disp: 20 tablet, Rfl: 0   amLODipine (NORVASC) 10 MG tablet, Take 10 mg by mouth daily., Disp: , Rfl:    ARIPiprazole (ABILIFY) 5 MG tablet, Take 1 tablet (5 mg total) by mouth daily., Disp: 30 tablet, Rfl: 5   ascorbic acid (VITAMIN C) 500 MG tablet, Take 1 tablet (500 mg total) by mouth daily., Disp: 30 tablet, Rfl: 0   atorvastatin (LIPITOR) 80 MG tablet, Take 80 mg by mouth daily., Disp: , Rfl:    Budeson-Glycopyrrol-Formoterol (BREZTRI AEROSPHERE) 160-9-4.8 MCG/ACT AERO, Inhale 2 puffs into the lungs in the morning and at bedtime., Disp: 5.9 g, Rfl: 0   buprenorphine (BUTRANS) 10 MCG/HR PTWK, Place 1 patch onto the skin once a week., Disp: 4 patch, Rfl: 4   busPIRone (BUSPAR) 10 MG tablet, Take 1 tablet (10 mg total) by mouth 2 (two) times daily., Disp: 60 tablet, Rfl: 5   Calcium Citrate-Vitamin D3 (GNP CALCIUM CITRATE+D MAXIMUM) 315-6.25 MG-MCG TABS, Take 1 tablet by mouth daily., Disp: , Rfl:    Cholecalciferol (VITAMIN D) 125 MCG (5000 UT) CAPS, Take 1 capsule by mouth daily., Disp: , Rfl:    ELIQUIS 5 MG TABS tablet, TAKE 1 TABLET BY MOUTH TWICE DAILY (Patient taking differently: Take 5 mg by mouth 2 (two) times daily.), Disp: 56 tablet, Rfl: 0   famotidine (PEPCID) 20 MG tablet, Take 1 tablet (20 mg total) by mouth  daily., Disp: 30 tablet, Rfl: 0   FLUoxetine (PROZAC) 40 MG capsule, Take 1 capsule (40 mg total) by mouth daily., Disp: 30 capsule, Rfl: 5   folic acid (FOLVITE) 1 MG tablet, Take 1 mg by mouth daily. , Disp: , Rfl:    hydroxychloroquine (PLAQUENIL) 200 MG tablet, Take 200 mg by mouth 2 (two) times daily., Disp: , Rfl:    levothyroxine (SYNTHROID, LEVOTHROID) 88 MCG tablet, Take 88 mcg by mouth daily before breakfast., Disp: , Rfl:    metFORMIN (GLUCOPHAGE-XR) 500 MG 24 hr tablet, Take 500 mg by mouth daily., Disp: , Rfl:    methotrexate (RHEUMATREX) 2.5 MG tablet, Take 2.5 mg by mouth once a week., Disp: , Rfl:    metoCLOPramide (REGLAN) 5 MG/5ML solution, Take 5 mg by mouth 4 (four) times daily -  before meals and at bedtime., Disp: , Rfl:    metoprolol tartrate (LOPRESSOR) 25 MG tablet, Take 25 mg by mouth 2 (two) times daily., Disp: , Rfl:    pramipexole (MIRAPEX) 0.5 MG tablet, Take 0.5 mg by mouth at bedtime., Disp: , Rfl:    pregabalin (LYRICA) 100 MG capsule, Take 1 capsule (100 mg total) by mouth 3 (three) times daily., Disp: 90 capsule, Rfl: 5   promethazine (PHENERGAN) 12.5 MG tablet, Take 12.5 mg by mouth., Disp: , Rfl:    QUEtiapine (SEROQUEL) 300 MG tablet, TAKE ONE TABLET BY MOUTH  AT BEDTIME., Disp: 30 tablet, Rfl: 5   TRADJENTA 5 MG TABS tablet, Take 5 mg by mouth daily., Disp: , Rfl:    zinc sulfate 220 (50 Zn) MG capsule, Take 220 mg by mouth at bedtime. , Disp: , Rfl:    zolpidem (AMBIEN) 5 MG tablet, Take 1 tablet (5 mg total) by mouth at bedtime., Disp: 30 tablet, Rfl: 5  Past Medical History: Past Medical History:  Diagnosis Date   Abdominal pain 02/12/2019   Abdominal wall hernia 01/29/2013   Acute metabolic encephalopathy 07/08/2019   AMS (altered mental status) 12/28/2021   Anxiety    Arthritis    Rheumatoid   C. difficile colitis    Chronic diastolic heart failure (HCC)    COVID-19 03/23/2019   Diagnosed at [facility] (send-out) on 03/23/2019   Depression    Diabetes  mellitus    states no meds or diet restrictions  at present   Diastolic CHF (HCC)    Esophagitis    Fluid retention    GERD (gastroesophageal reflux disease)    Hiatal hernia    Hypertension    Hypokalemia due to loss of potassium 10/21/2015   Overview:  Associated with 3 weeks of diarrhea  And QT prolongation.   Hypothyroidism    IBS (irritable bowel syndrome)    Moderate episode of recurrent major depressive disorder (HCC) 06/03/2004   Morbid obesity (HCC)    MRSA (methicillin resistant Staphylococcus aureus) infection 11/2017   left inner thigh abcess   Neurogenic bladder    has pacemaker   Neuropathy    Obesity    Panic attacks    Pneumonia due to COVID-19 virus    Rheumatoid arthritis (HCC)    Sleep apnea    STATES SEVERE, CANT TOLERATE MASK- LAST STUDY YEARS AGO    Tobacco Use: Social History   Tobacco Use  Smoking Status Former   Packs/day: 2.00   Years: 27.00   Additional pack years: 0.00   Total pack years: 54.00   Types: Cigarettes   Quit date: 07/30/1999   Years since quitting: 23.1  Smokeless Tobacco Never  Tobacco Comments   quit in 2001 2-2.5 a day    Labs: Review Flowsheet  More data exists      Latest Ref Rng & Units 07/09/2019 07/10/2019 01/10/2020 12/27/2021 01/04/2022  Labs for ITP Cardiac and Pulmonary Rehab  Hemoglobin A1c 4.8 - 5.6 % - - 10.8  - 6.8   PH, Arterial 7.350 - 7.450 7.38  7.48  - - -  PCO2 arterial 32.0 - 48.0 mmHg 36  25  - - -  Bicarbonate 20.0 - 28.0 mmol/L 21.3  18.6  - 27.2  -  Acid-base deficit 0.0 - 2.0 mmol/L 3.3  3.3  - - -  O2 Saturation % 96.6  97.3  - 65.4  -     Pulmonary Assessment Scores:  Pulmonary Assessment Scores     Row Name 04/05/22 1713         ADL UCSD   ADL Phase Entry     SOB Score total 81     Rest 2     Walk 3     Stairs 5     Bath 4     Dress 3     Shop 3       CAT Score   CAT Score 22       mMRC Score   mMRC Score 2  UCSD: Self-administered rating of dyspnea  associated with activities of daily living (ADLs) 6-point scale (0 = "not at all" to 5 = "maximal or unable to do because of breathlessness")  Scoring Scores range from 0 to 120.  Minimally important difference is 5 units  CAT: CAT can identify the health impairment of COPD patients and is better correlated with disease progression.  CAT has a scoring range of zero to 40. The CAT score is classified into four groups of low (less than 10), medium (10 - 20), high (21-30) and very high (31-40) based on the impact level of disease on health status. A CAT score over 10 suggests significant symptoms.  A worsening CAT score could be explained by an exacerbation, poor medication adherence, poor inhaler technique, or progression of COPD or comorbid conditions.  CAT MCID is 2 points  mMRC: mMRC (Modified Medical Research Council) Dyspnea Scale is used to assess the degree of baseline functional disability in patients of respiratory disease due to dyspnea. No minimal important difference is established. A decrease in score of 1 point or greater is considered a positive change.   Pulmonary Function Assessment:  Pulmonary Function Assessment - 03/31/22 1422       Breath   Shortness of Breath Yes;Limiting activity;Panic with Shortness of Breath             Exercise Target Goals: Exercise Program Goal: Individual exercise prescription set using results from initial 6 min walk test and THRR while considering  patient's activity barriers and safety.   Exercise Prescription Goal: Initial exercise prescription builds to 30-45 minutes a day of aerobic activity, 2-3 days per week.  Home exercise guidelines will be given to patient during program as part of exercise prescription that the participant will acknowledge.  Education: Aerobic Exercise: - Group verbal and visual presentation on the components of exercise prescription. Introduces F.I.T.T principle from ACSM for exercise prescriptions.  Reviews  F.I.T.T. principles of aerobic exercise including progression. Written material given at graduation.   Education: Resistance Exercise: - Group verbal and visual presentation on the components of exercise prescription. Introduces F.I.T.T principle from ACSM for exercise prescriptions  Reviews F.I.T.T. principles of resistance exercise including progression. Written material given at graduation.    Education: Exercise & Equipment Safety: - Individual verbal instruction and demonstration of equipment use and safety with use of the equipment. Flowsheet Row Pulmonary Rehab from 04/05/2022 in [facility] Cardiac and Pulmonary Rehab  Date 04/05/22  Educator [facility]  Instruction Review Code 1- Verbalizes Understanding       Education: Exercise Physiology & General Exercise Guidelines: - Group verbal and written instruction with models to review the exercise physiology of the cardiovascular system and associated critical values. Provides general exercise guidelines with specific guidelines to those with heart or lung disease.    Education: Flexibility, Balance, Mind/Body Relaxation: - Group verbal and visual presentation with interactive activity on the components of exercise prescription. Introduces F.I.T.T principle from ACSM for exercise prescriptions. Reviews F.I.T.T. principles of flexibility and balance exercise training including progression. Also discusses the mind body connection.  Reviews various relaxation techniques to help reduce and manage stress (i.e. Deep breathing, progressive muscle relaxation, and visualization). Balance handout provided to take home. Written material given at graduation.   Activity Barriers & Risk Stratification:  Activity Barriers & Cardiac Risk Stratification - 04/05/22 1702       Activity Barriers & Cardiac Risk Stratification   Activity Barriers Shortness of Breath;History of Falls;Deconditioning;Assistive Device;Balance Concerns;Joint Problems;Arthritis  6 Minute Walk:  6 Minute Walk     Row Name 04/05/22 1659 09/22/22 1625       6 Minute Walk   Phase Initial Discharge    Distance 530 feet 600 feet    Distance % Change -- 13.2 %    Distance Feet Change -- 70 ft    Walk Time 6 minutes 5 minutes    # of Rest Breaks 0 1    MPH 1 1.36    METS 1 1    RPE 15 15    Perceived Dyspnea  3 3    VO2 Peak 2 1.92    Symptoms Yes (comment) Yes (comment)    Comments SOB Getting over being sick, still felt weak    Resting HR 62 bpm 74 bpm    Resting BP 120/70 112/64    Resting Oxygen Saturation  95 % 95 %    Exercise Oxygen Saturation  during 6 min walk 91 % 89 %    Max Ex. HR 77 bpm 89 bpm    Max Ex. BP 130/70 128/70    2 Minute Post BP 118/82 122/82      Interval HR   1 Minute HR 69 75    2 Minute HR 75 77    3 Minute HR 77 74    4 Minute HR 77 82    5 Minute HR 77 89    6 Minute HR 73 89    2 Minute Post HR 69 75    Interval Heart Rate? Yes Yes      Interval Oxygen   Interval Oxygen? Yes Yes    Baseline Oxygen Saturation % 95 % 95 %    1 Minute Oxygen Saturation % 95 % 93 %    1 Minute Liters of Oxygen 3 L 4 L    2 Minute Oxygen Saturation % 91 % 92 %    2 Minute Liters of Oxygen 3 L 4 L    3 Minute Oxygen Saturation % 91 % 89 %    3 Minute Liters of Oxygen 3 L 4 L    4 Minute Oxygen Saturation % 92 % 90 %    4 Minute Liters of Oxygen 3 L 4 L    5 Minute Oxygen Saturation % 91 % 90 %    5 Minute Liters of Oxygen 3 L 4 L    6 Minute Oxygen Saturation % 92 % 93 %    6 Minute Liters of Oxygen 3 L 4 L    2 Minute Post Oxygen Saturation % 97 % 94 %    2 Minute Post Liters of Oxygen 3 L 4 L            Oxygen Initial Assessment:  Oxygen Initial Assessment - 07/05/22 1646       Home Oxygen   Home Oxygen Device Home Concentrator;E-Tanks;Portable Concentrator    Sleep Oxygen Prescription Continuous    Liters per minute 4    Home Exercise Oxygen Prescription Continuous    Liters per minute 4    Liters per  minute 4    Compliance with Home Oxygen Use Yes      Program Oxygen Prescription   Program Oxygen Prescription Continuous    Liters per minute 4      Intervention   Short Term Goals To learn and exhibit compliance with exercise, home and travel O2 prescription;To learn and understand importance of maintaining oxygen saturations>88%;To learn and demonstrate  proper use of respiratory medications;To learn and understand importance of monitoring SPO2 with pulse oximeter and demonstrate accurate use of the pulse oximeter.;To learn and demonstrate proper pursed lip breathing techniques or other breathing techniques.     Long  Term Goals Verbalizes importance of monitoring SPO2 with pulse oximeter and return demonstration;Exhibits proper breathing techniques, such as pursed lip breathing or other method taught during program session;Exhibits compliance with exercise, home  and travel O2 prescription;Maintenance of O2 saturations>88%;Compliance with respiratory medication             Oxygen Re-Evaluation:  Oxygen Re-Evaluation     Row Name 04/07/22 1626 05/05/22 1619 05/24/22 1606 06/28/22 1613 07/05/22 1646     Program Oxygen Prescription   Program Oxygen Prescription -- Continuous Continuous Continuous --   Liters per minute -- 3 3 4  --     Home Oxygen   Home Oxygen Device -- Home Concentrator;E-Tanks;Portable Concentrator Home Concentrator;E-Tanks;Portable Concentrator Home Concentrator;E-Tanks;Portable Concentrator --   Sleep Oxygen Prescription -- Continuous Continuous Continuous --   Liters per minute -- 3 3 4  --   Home Exercise Oxygen Prescription -- Continuous Continuous Continuous --   Liters per minute -- 3 3 4  --   Home Resting Oxygen Prescription -- Continuous Continuous Continuous --   Liters per minute -- 3 3 4  --   Compliance with Home Oxygen Use -- Yes Yes Yes --     Goals/Expected Outcomes   Short Term Goals To learn and exhibit compliance with exercise, home and travel  O2 prescription;To learn and understand importance of maintaining oxygen saturations>88%;To learn and demonstrate proper use of respiratory medications;To learn and understand importance of monitoring SPO2 with pulse oximeter and demonstrate accurate use of the pulse oximeter.;To learn and demonstrate proper pursed lip breathing techniques or other breathing techniques.  To learn and understand importance of maintaining oxygen saturations>88%;To learn and understand importance of monitoring SPO2 with pulse oximeter and demonstrate accurate use of the pulse oximeter. Other -- --   Long  Term Goals Verbalizes importance of monitoring SPO2 with pulse oximeter and return demonstration;Exhibits proper breathing techniques, such as pursed lip breathing or other method taught during program session;Demonstrates proper use of MDI's;Compliance with respiratory medication;Maintenance of O2 saturations>88%;Exhibits compliance with exercise, home  and travel O2 prescription Maintenance of O2 saturations>88%;Verbalizes importance of monitoring SPO2 with pulse oximeter and return demonstration Other -- --   Comments Reviewed PLB technique with pt.  Talked about how it works and it's importance in maintaining their exercise saturations. She has a pulse oximeter to check her/his oxygen saturation at home. Informed and explained why it is important to have one. Reviewed that oxygen saturations should be 88 percent and above. Patient verbalizes understanding. [name] [name] has been checking her oxygen at home and she states it been in the 90s range. She has been practicing PLB to help with her breathing. She has no questions about her respiratory medications. Diaphragmatic and PLB breathing explained and performed with patient. Patient has a better understanding of how to do these exercises to help with breathing performance and relaxation. Patient performed breathing techniques adequately and to practice further at home. [name] [name] continues  to monitor her O2 and HR at home. She knows when to rest and continues to practice PLB. She is staying compliant with medications and breathing techqniues. She ensures her O2 stays above 88%.   Goals/Expected Outcomes Short: Become more profiecient at using PLB.   Long: Become independent at using PLB. Short: monitor oxygen at home  with exertion. Long: maintain oxygen saturations above 88 percent independently. Short: continue to use PLB. Long: maintain oxygen and PLB independently. Short: practice PLB and diaphragmatic breathing at home. Long: Use PLB and diaphragmatic breathing independently post [employer]. Short: Continue to monitor O2 and HR at home Long: Become efficient at PLB long-term, utilize other breathing techniques when needed    Row Name 07/19/22 1619 08/23/22 1606           Program Oxygen Prescription   Program Oxygen Prescription Continuous Continuous      Liters per minute 4 4        Home Oxygen   Home Oxygen Device Home Concentrator;E-Tanks;Portable Concentrator Home Concentrator;E-Tanks;Portable Concentrator      Sleep Oxygen Prescription Continuous Continuous      Liters per minute 4 4      Home Exercise Oxygen Prescription Continuous Continuous      Liters per minute 4 4      Home Resting Oxygen Prescription Continuous Continuous      Liters per minute 4 4      Compliance with Home Oxygen Use Yes Yes        Goals/Expected Outcomes   Short Term Goals To learn and demonstrate proper pursed lip breathing techniques or other breathing techniques.  To learn and exhibit compliance with exercise, home and travel O2 prescription      Long  Term Goals Exhibits proper breathing techniques, such as pursed lip breathing or other method taught during program session Exhibits compliance with exercise, home  and travel O2 prescription;Demonstrates proper use of MDI's      Comments Diaphragmatic and PLB breathing explained and performed with patient. Patient has a better understanding  of how to do these exercises to help with breathing performance and relaxation. Patient performed breathing techniques adequately and to practice further at home. [name] [name] has been using her PLB at home. She still needs to work on using her PLB when exercising. She knows how to use her oxygen machine and medications and has no questions about them.      Goals/Expected Outcomes Short: practice PLB and diaphragmatic breathing at home. Long: Use PLB and diaphragmatic breathing independently post [employer]. Short: use PLB when exercising. Long: maintain PLB on exertion.               Oxygen Discharge (Final Oxygen Re-Evaluation):  Oxygen Re-Evaluation - 08/23/22 1606       Program Oxygen Prescription   Program Oxygen Prescription Continuous    Liters per minute 4      Home Oxygen   Home Oxygen Device Home Concentrator;E-Tanks;Portable Concentrator    Sleep Oxygen Prescription Continuous    Liters per minute 4    Home Exercise Oxygen Prescription Continuous    Liters per minute 4    Home Resting Oxygen Prescription Continuous    Liters per minute 4    Compliance with Home Oxygen Use Yes      Goals/Expected Outcomes   Short Term Goals To learn and exhibit compliance with exercise, home and travel O2 prescription    Long  Term Goals Exhibits compliance with exercise, home  and travel O2 prescription;Demonstrates proper use of MDI's    Comments [name] [name] has been using her PLB at home. She still needs to work on using her PLB when exercising. She knows how to use her oxygen machine and medications and has no questions about them.    Goals/Expected Outcomes Short: use PLB when exercising. Long: maintain PLB on exertion.  Initial Exercise Prescription:  Initial Exercise Prescription - 04/05/22 1700       Date of Initial Exercise RX and Referring Provider   Date 04/05/22    Referring Provider Francene Boyers      Oxygen   Oxygen Continuous    Liters 3    Maintain Oxygen  Saturation 88% or higher      NuStep   Level 1    SPM 80    Minutes 15    METs 1      Biostep-RELP   Level 1    SPM 80    Minutes 15    METs 1      Track   Laps 5    Minutes 15    METs 1.27      Prescription Details   Frequency (times per week) 2    Duration Progress to 30 minutes of continuous aerobic without signs/symptoms of physical distress      Intensity   THRR 40-80% of Max Heartrate 101-140    Ratings of Perceived Exertion 11-13    Perceived Dyspnea 0-4      Progression   Progression Continue to progress workloads to maintain intensity without signs/symptoms of physical distress.      Resistance Training   Training Prescription Yes    Weight 2    Reps 10-15             Perform Capillary Blood Glucose checks as needed.  Exercise Prescription Changes:   Exercise Prescription Changes     Row Name 04/05/22 1700 04/15/22 1100 05/12/22 1100 05/25/22 1500 06/08/22 1500     Response to Exercise   Blood Pressure (Admit) 120/70 118/64 118/78 122/72 118/64   Blood Pressure (Exercise) 130/70 124/60 122/82 130/68 124/70   Blood Pressure (Exit) 118/82 110/62 112/70 122/78 118/62   Heart Rate (Admit) 62 bpm 71 bpm 69 bpm 70 bpm 69 bpm   Heart Rate (Exercise) 77 bpm 84 bpm 116 bpm 74 bpm 100 bpm   Heart Rate (Exit) 73 bpm 76 bpm 81 bpm 72 bpm 75 bpm   Oxygen Saturation (Admit) 95 % 98 % 98 % 97 % 98 %   Oxygen Saturation (Exercise) 91 % 92 % 92 % 92 % 94 %   Oxygen Saturation (Exit) 97 % 96 % 97 % 93 % 96 %   Rating of Perceived Exertion (Exercise) 15 15 15 13 16    Perceived Dyspnea (Exercise) 3 3 3 1 3    Symptoms SOB SOB SOB SOB SOB   Comments 6 MWT results 1st full day of exercise -- -- --   Duration -- Progress to 30 minutes of  aerobic without signs/symptoms of physical distress Progress to 30 minutes of  aerobic without signs/symptoms of physical distress Progress to 30 minutes of  aerobic without signs/symptoms of physical distress Continue with 30 min of  aerobic exercise without signs/symptoms of physical distress.   Intensity -- THRR unchanged THRR unchanged THRR unchanged THRR unchanged     Progression   Progression -- Continue to progress workloads to maintain intensity without signs/symptoms of physical distress. Continue to progress workloads to maintain intensity without signs/symptoms of physical distress. Continue to progress workloads to maintain intensity without signs/symptoms of physical distress. Continue to progress workloads to maintain intensity without signs/symptoms of physical distress.   Average METs -- 2.07 1.76 2.01 1.63     Resistance Training   Training Prescription -- Yes Yes Yes Yes   Weight -- 2 lb 2 lb 2  lb 2 lb   Reps -- 10-15 10-15 10-15 10-15     Interval Training   Interval Training -- No No No No     Oxygen   Oxygen -- Continuous Continuous Continuous Continuous   Liters -- 3 3 3 3      Treadmill   MPH -- -- 0.6 0.4 0.7   Grade -- -- 0 0 0   Minutes -- -- 15 15 15    METs -- -- 1.5 1.31 1.5     NuStep   Level -- 2 2 3 3    Minutes -- 15 15 15 15    METs -- 2.6 2.1 2.4 --     Biostep-RELP   Level -- -- -- 1 1   Minutes -- -- -- 15 15   METs -- -- -- 3 2     Track   Laps -- 10 16 -- --   Minutes -- 15 15 -- --   METs -- 1.54 1.87 -- --     Oxygen   Maintain Oxygen Saturation -- 88% or higher -- 88% or higher 88% or higher    Row Name 06/23/22 1400 07/05/22 1600 07/06/22 1200 07/22/22 0900 08/04/22 1000     Response to Exercise   Blood Pressure (Admit) 126/64 -- 130/62 140/68 128/68   Blood Pressure (Exercise) 154/70 -- -- -- --   Blood Pressure (Exit) 110/62 -- 102/62 132/62 126/60   Heart Rate (Admit) 75 bpm -- 68 bpm 70 bpm 66 bpm   Heart Rate (Exercise) 78 bpm -- 70 bpm 88 bpm 75 bpm   Heart Rate (Exit) 76 bpm -- 65 bpm 70 bpm 66 bpm   Oxygen Saturation (Admit) 96 % -- 95 % 98 % 98 %   Oxygen Saturation (Exercise) 93 % -- 93 % 89 % 92 %   Oxygen Saturation (Exit) 98 % -- 97 % 98 % 97  %   Rating of Perceived Exertion (Exercise) 13 -- 13 13 15    Perceived Dyspnea (Exercise) 2 -- 2 3 2    Symptoms SOB -- SOB SOB SOB   Duration Continue with 30 min of aerobic exercise without signs/symptoms of physical distress. -- Continue with 30 min of aerobic exercise without signs/symptoms of physical distress. Continue with 30 min of aerobic exercise without signs/symptoms of physical distress. Continue with 30 min of aerobic exercise without signs/symptoms of physical distress.   Intensity THRR unchanged -- THRR unchanged THRR unchanged THRR unchanged     Progression   Progression Continue to progress workloads to maintain intensity without signs/symptoms of physical distress. -- Continue to progress workloads to maintain intensity without signs/symptoms of physical distress. Continue to progress workloads to maintain intensity without signs/symptoms of physical distress. Continue to progress workloads to maintain intensity without signs/symptoms of physical distress.   Average METs 1.76 -- 1.66 1.91 1.77     Resistance Training   Training Prescription Yes -- Yes Yes Yes   Weight 2 lb -- 2 lb 2 lb 2 lb   Reps 10-15 -- 10-15 10-15 10-15     Interval Training   Interval Training No -- No No No     Oxygen   Oxygen Continuous -- Continuous Continuous Continuous   Liters 3-4 -- 4 4 4      Treadmill   MPH 0.7 -- 0.7 0.6 0.7   Grade 0 -- 0 0 0   Minutes 15 -- 15 15 15    METs 1.54 -- 1.54 1.46 1.54  NuStep   Level 3 -- 3 5 5    Minutes 15 -- 15 15 15    METs 1.5 -- 1.8 3.2 2     Biostep-RELP   Level 1 -- 2 -- --   Minutes 15 -- 15 -- --   METs 2 -- 2 -- --     Home Exercise Plan   Plans to continue exercise at -- Home (comment)  walking, staff videos, seated chair exercises, possibly going to [facility] (comment)  walking, staff videos, seated chair exercises, possibly going to [facility] (comment)  walking, staff videos, seated chair exercises, possibly going to [facility] (comment)  walking, staff videos, seated chair exercises, possibly going to [employer] -- Add 3 additional days to program exercise sessions.  start with 1 day, slowly build up to it Add 3 additional days to program exercise sessions.  start with 1 day, slowly build up to it Add 3 additional days to program exercise sessions.  start with 1 day, slowly build up to it Add 3 additional days to program exercise sessions.  start with 1 day, slowly build up to it   Initial Home Exercises Provided -- 07/05/22 07/05/22 07/05/22 07/05/22     Oxygen   Maintain Oxygen Saturation 88% or higher 88% or higher 88% or higher 88% or higher 88% or higher    Row Name 08/18/22 1300 08/31/22 1600 09/15/22 0800         Response to Exercise   Blood Pressure (Admit) 124/72 104/62 118/64     Blood Pressure (Exit) 110/62 100/60 102/62     Heart Rate (Admit) 71 bpm 60 bpm 77 bpm     Heart Rate (Exercise) 75 bpm 72 bpm 78 bpm     Heart Rate (Exit) 67 bpm 56 bpm 70 bpm     Oxygen Saturation (Admit) 97 % 98 % 94 %     Oxygen Saturation (Exercise) 95 % 93 % 90 %     Oxygen Saturation (Exit) 96 % 96 % 96 %     Rating of Perceived Exertion (Exercise) 12 15 13      Perceived Dyspnea (Exercise) 1 3 2      Symptoms SOB SOB SOB     Duration Continue with 30 min of aerobic exercise without signs/symptoms of physical distress. Continue with 30 min of aerobic exercise without signs/symptoms of physical distress. Continue with 30 min of aerobic exercise without signs/symptoms of physical distress.     Intensity THRR unchanged THRR unchanged THRR unchanged       Progression   Progression Continue to progress workloads to maintain intensity without signs/symptoms of physical distress. Continue to progress workloads to maintain intensity without signs/symptoms of physical distress. Continue to progress workloads to maintain intensity without signs/symptoms of physical distress.     Average METs 2 1.75 1.72        Resistance Training   Training Prescription Yes Yes Yes     Weight 2 lb 2 lb 2 lb     Reps 10-15 10-15 10-15       Interval Training   Interval Training No No No       Oxygen   Oxygen Continuous Continuous Continuous     Liters 4 4 4        Treadmill   MPH 1 0.6 0.5     Grade 0 0 0     Minutes 15 15 15      METs 1.77 1.46 1.38  NuStep   Level 4 2 2      Minutes 15 15 15      METs 2.6 2.1 2.1       Home Exercise Plan   Plans to continue exercise at Home (comment)  walking, staff videos, seated chair exercises, possibly going to [facility] (comment)  walking, staff videos, seated chair exercises, possibly going to [facility] (comment)  walking, staff videos, seated chair exercises, possibly going to [employer] Add 3 additional days to program exercise sessions.  start with 1 day, slowly build up to it Add 3 additional days to program exercise sessions.  start with 1 day, slowly build up to it Add 3 additional days to program exercise sessions.  start with 1 day, slowly build up to it     Initial Home Exercises Provided 07/05/22 07/05/22 07/05/22       Oxygen   Maintain Oxygen Saturation 88% or higher 88% or higher 88% or higher              Exercise Comments:   Exercise Comments     Row Name 04/07/22 1623           Exercise Comments First full day of exercise!  Patient was oriented to gym and equipment including functions, settings, policies, and procedures.  Patient's individual exercise prescription and treatment plan were reviewed.  All starting workloads were established based on the results of the 6 minute walk test done at initial orientation visit.  The plan for exercise progression was also introduced and progression will be customized based on patient's performance and goals.                Exercise Goals and Review:   Exercise Goals     Row Name 04/05/22 1709             Exercise Goals   Increase Physical Activity Yes        Intervention Provide advice, education, support and counseling about physical activity/exercise needs.;Develop an individualized exercise prescription for aerobic and resistive training based on initial evaluation findings, risk stratification, comorbidities and participant's personal goals.       Expected Outcomes Short Term: Attend rehab on a regular basis to increase amount of physical activity.;Long Term: Add in home exercise to make exercise part of routine and to increase amount of physical activity.;Long Term: Exercising regularly at least 3-5 days a week.       Increase Strength and Stamina Yes       Intervention Provide advice, education, support and counseling about physical activity/exercise needs.;Develop an individualized exercise prescription for aerobic and resistive training based on initial evaluation findings, risk stratification, comorbidities and participant's personal goals.       Expected Outcomes Short Term: Increase workloads from initial exercise prescription for resistance, speed, and METs.;Short Term: Perform resistance training exercises routinely during rehab and add in resistance training at home;Long Term: Improve cardiorespiratory fitness, muscular endurance and strength as measured by increased METs and functional capacity ( )       Able to understand and use rate of perceived exertion (RPE) scale Yes       Intervention Provide education and explanation on how to use RPE scale       Expected Outcomes Short Term: Able to use RPE daily in rehab to express subjective intensity level;Long Term:  Able to use RPE to guide intensity level when exercising independently       Able to understand and use  Dyspnea scale Yes       Intervention Provide education and explanation on how to use Dyspnea scale       Expected Outcomes Short Term: Able to use Dyspnea scale daily in rehab to express subjective sense of shortness of breath during exertion;Long Term: Able to use Dyspnea scale to  guide intensity level when exercising independently       Knowledge and understanding of Target Heart Rate Range (THRR) Yes       Intervention Provide education and explanation of THRR including how the numbers were predicted and where they are located for reference       Expected Outcomes Short Term: Able to state/look up THRR;Long Term: Able to use THRR to govern intensity when exercising independently;Short Term: Able to use daily as guideline for intensity in rehab       Able to check pulse independently Yes       Intervention Provide education and demonstration on how to check pulse in carotid and radial arteries.;Review the importance of being able to check your own pulse for safety during independent exercise       Expected Outcomes Short Term: Able to explain why pulse checking is important during independent exercise;Long Term: Able to check pulse independently and accurately       Understanding of Exercise Prescription Yes       Intervention Provide education, explanation, and written materials on patient's individual exercise prescription       Expected Outcomes Short Term: Able to explain program exercise prescription;Long Term: Able to explain home exercise prescription to exercise independently                Exercise Goals Re-Evaluation :  Exercise Goals Re-Evaluation     Row Name 04/07/22 1624 04/15/22 1150 04/27/22 1346 05/12/22 1111 05/25/22 1515     Exercise Goal Re-Evaluation   Exercise Goals Review Increase Physical Activity;Able to understand and use rate of perceived exertion (RPE) scale;Knowledge and understanding of Target Heart Rate Range (THRR);Understanding of Exercise Prescription;Able to understand and use Dyspnea scale;Able to check pulse independently;Increase Strength and Stamina Increase Physical Activity;Increase Strength and Stamina;Understanding of Exercise Prescription Increase Physical Activity;Increase Strength and Stamina;Understanding of Exercise  Prescription Increase Physical Activity;Increase Strength and Stamina;Understanding of Exercise Prescription Increase Physical Activity;Increase Strength and Stamina;Understanding of Exercise Prescription   Comments Reviewed RPE scale, THR and program prescription with pt today.  Pt voiced understanding and was given a copy of goals to take home. [name] [name] completed her first rehab session and did well. She was able to do a full 10 laps on the track, which is double her initial exercise prescription! Walking is challenging for her as she had to take several breaks so we hope to see that improve over time. We will continue to monitor as she progresses in the program. [name] [name] returned to rehab on 04/26/2022 after not attending since the last review. We will encourage her to stay consistent with her attendance so that she can see greater benefits from the program. We will continue to monitor her progress. [name] [name] is back into routine with rehab again. She tried out the treadmill and feels more stable on there as she can hang on, and was able to walk at a 0.6 mph. She also walked the track as well and increased from 10 to 16 laps! Her dyspnea continues to average 2-3 and hope to see that improve overtime. Oxygen saturations are staying above 88%. Will continue to monitor. [name] [name] has only attended rehab  twice since the last review. She was able to increase her overall average MET level back above 2 METs. She also improved to level 3 on the T4 Nustep. However, her speed on the treadmill decreased from 0.6 mph to 0.4 mph. We will continue to monitor her progress in the program.   Expected Outcomes Short: Use RPE daily to regulate intensity.  Long: Follow program prescription in THR. Short: Continue initial exercise prescription Long: Increase overall MET level Short: Return to regular attendance in the program. Long: Continue to follow exercise prescription. Short: Continue to increase speed on treadmill progressively Long:  Increase overall MET level and stamina Short: Continue to progressively increase speed on treadmill. Long: Continue to improve strength and stamina.    Row Name 06/08/22 1512 06/23/22 1428 07/05/22 1629 07/06/22 1258 07/19/22 1617     Exercise Goal Re-Evaluation   Exercise Goals Review Increase Physical Activity;Increase Strength and Stamina;Understanding of Exercise Prescription Increase Physical Activity;Increase Strength and Stamina;Understanding of Exercise Prescription Increase Physical Activity;Increase Strength and Stamina;Understanding of Exercise Prescription;Able to understand and use Dyspnea scale;Able to check pulse independently;Able to understand and use rate of perceived exertion (RPE) scale;Knowledge and understanding of Target Heart Rate Range (THRR) Increase Physical Activity;Increase Strength and Stamina;Understanding of Exercise Prescription Increase Physical Activity;Increase Strength and Stamina   Comments [name] [name] has only attended rehab 2 times since last review as she has been out sick. She was able to increase to 0.7 mph on the treadmill which is the highest she has been yet. She would definitely get more benefit attending more sessions consistently. Her dyspnea still ranges between 2-3 and we hope to see that improve over time. Will continue to monitor. [name] [name] has only attended rehab twice since the last review. She has continued to walk at a speed of 0.7 mph due to her saturations dropping and SOB. She has also stayed consistent with level 1 on the biostep and level 3 on the T4 nustep. She has tolerated 2 lb hand weights for resistance training as well. We will continue to monitor her progress in the program. Reviewed home exercise with pt today.  Pt plans to seated chair exercises for exercise. Provided staff videos and seated Youtube videos to complete.  Patient plans to walk but is only comfortable doing so on a treadmill as she has something to hang on to. She also knows to always  use her cane and/or walker for assistance as she does have balance problems. Gave her [employer]. She experiences flare ups with gastroporesis and is limited by good and bad days. Exercise when she feels her best. Reviewed THR, pulse, RPE, sign and symptoms, pulse oximetery and when to call 911 or MD.  Also discussed weather considerations and indoor options.  Pt voiced understanding. [name] [name] has not been coming to rehab consistently but to having flare ups with gastroparesis. She has stated consistent at a 0.7 speed on the treadmill, however, has noticed she hasn't been needing to stop and can walk the full 20 minutes. Her o2 saturations are staying above 88%. We will encourage her to increase to 3 lbs for handweights. Will continue to monitor. [name] [name] states that she is going to walk at home when she is done with the program. She is going to walk at home about three days a week.   Expected Outcomes Short: Attend more consistently once feeling better Long: Continue to increase overall MET level and stamina Short: Attend rehab consistently, progressively increase treadmill workload. Long: Continue to improve strength  and stamina. Short: Start with slow structured exercise at home, try out a seated exercise on her good days Long: Continue to exercise independently at appropriate prescription Short: Increase to 3 lb handweights Long: Increase overall MET level and stamina Short: graduate [facility]. Long: maintain exercise independently.    Row Name 07/22/22 1610 08/04/22 1025 08/18/22 1359 08/31/22 1609 09/15/22 0900     Exercise Goal Re-Evaluation   Exercise Goals Review Increase Physical Activity;Increase Strength and Stamina;Understanding of Exercise Prescription Increase Physical Activity;Increase Strength and Stamina;Understanding of Exercise Prescription Increase Physical Activity;Increase Strength and Stamina;Understanding of Exercise Prescription Increase Physical Activity;Increase Strength and  Stamina;Understanding of Exercise Prescription Increase Physical Activity;Increase Strength and Stamina;Understanding of Exercise Prescription   Comments [name] [name] is doing well in the program. She recently has worked at an average of 1.91 METs. She also was able to improve to level 5 on the T4 nustep. Her speed on the treadmill did go down from 0.7 to 0.6 mph. We will continue to monitor her progress in the program. [name] [name] had came to 2 sessions since last review- her attendance has been a little sporadic due to gastroparesis side effects. She has stayed consistent at 0.6-0.7 mph on the treadmill. She is consistent at level 5 on the T4 Nustep as well. She is not quite hitting her THR as her dyspnea and SOB continue to be a barrier. She could benefit from increasing to 3 lb for handweights. Will continue to monitor. [name] [name] is doing well in rehab. She did increase her speed on the treadmill to 1.0 mph! That is her highest yet. She used to do level 5 on the T4 and went down to level 4 last time, staff will encourage patient to increase that back up. Her oxygen remains above 88% during exercise. She is limited doing the T4 and TM primarily. [name] [name] is doing well in rehab. Her speed on the treadmill fluctuates based on her breathing feels that day. She ranges between 0.6-1.0 mph. Her level on the T4 Nustep is back down to 2, and we will encourage her to increase that back up. She is due for a post next week and hope to see improvement. Will continue to monitor. [name] [name] is doing well in rehab. She has continued to work at an average MET level above 1.7 METs. She also has been consistent with her treadmill speed at 0.5% with no incline, and her T4 nustep workload at level 2. Her O2 saturations have also stayed above 90% since the last review. [name] [name] is due for her post and will look to improve on it. We will continue to monitor her progress in the program.   Expected Outcomes Short: Increase speed back up on the  treadmill. Long: Continue to improve stamina. Short: Increase to 3 lb handweights Long: Continue to increase overall MET level and stamina Short: Continue to increase treadmill speed slowly, strive for 1.1 mph Long: Continue to increase overall MET level and stamina Short: Improve on post Long: Continue to build up overall strength and stamina Short: Improve on post . Long: Continue to improve strength and stamina.            Discharge Exercise Prescription (Final Exercise Prescription Changes):  Exercise Prescription Changes - 09/15/22 0800       Response to Exercise   Blood Pressure (Admit) 118/64    Blood Pressure (Exit) 102/62    Heart Rate (Admit) 77 bpm    Heart Rate (Exercise) 78 bpm    Heart  Rate (Exit) 70 bpm    Oxygen Saturation (Admit) 94 %    Oxygen Saturation (Exercise) 90 %    Oxygen Saturation (Exit) 96 %    Rating of Perceived Exertion (Exercise) 13    Perceived Dyspnea (Exercise) 2    Symptoms SOB    Duration Continue with 30 min of aerobic exercise without signs/symptoms of physical distress.    Intensity THRR unchanged      Progression   Progression Continue to progress workloads to maintain intensity without signs/symptoms of physical distress.    Average METs 1.72      Resistance Training   Training Prescription Yes    Weight 2 lb    Reps 10-15      Interval Training   Interval Training No      Oxygen   Oxygen Continuous    Liters 4      Treadmill   MPH 0.5    Grade 0    Minutes 15    METs 1.38      NuStep   Level 2    Minutes 15    METs 2.1      Home Exercise Plan   Plans to continue exercise at Home (comment)   walking, staff videos, seated chair exercises, possibly going to [employer] Add 3 additional days to program exercise sessions.   start with 1 day, slowly build up to it   Initial Home Exercises Provided 07/05/22      Oxygen   Maintain Oxygen Saturation 88% or higher             Nutrition:  Target  Goals: Understanding of nutrition guidelines, daily intake of sodium 1500mg , cholesterol 200mg , calories 30% from fat and 7% or less from saturated fats, daily to have 5 or more servings of fruits and vegetables.  Education: All About Nutrition: -Group instruction provided by verbal, written material, interactive activities, discussions, models, and posters to present general guidelines for heart healthy nutrition including fat, fiber, MyPlate, the role of sodium in heart healthy nutrition, utilization of the nutrition label, and utilization of this knowledge for meal planning. Follow up email sent as well. Written material given at graduation.   Biometrics:  Pre Biometrics - 04/05/22 1710       Pre Biometrics   Height 5' 6.75" (1.695 m)    Weight 318 lb 1.6 oz (144.3 kg)    BMI (Calculated) 50.22    Single Leg Stand 0 seconds             Post Biometrics - 09/22/22 1631        Post  Biometrics   Height 5' 6.75" (1.695 m)    Weight 338 lb (153.3 kg)    BMI (Calculated) 53.36    Single Leg Stand 0 seconds             Nutrition Therapy Plan and Nutrition Goals:  Nutrition Therapy & Goals - 04/05/22 1546       Nutrition Therapy   Diet Heart healthy, low Na, T2DM MNT    Drug/Food Interactions Statins/Certain Fruits    Protein (specify units) 110g    Fiber 25 grams    Whole Grain Foods 3 servings    Saturated Fats 12 max. grams    Fruits and Vegetables 8 servings/day    Sodium 2 grams      Personal Nutrition Goals   Nutrition Goal ST: limit sodium/read food labels, practice MyPlate guidelines, cut down more on regular soda,  and swap out her bacon grease and butter for liquid plant oils when cooking. LT: limit Na <2g/day, limit added sugar <24g/day, limit saturated fat <12g/day, follow MyPlate guidelines.    Comments 62 y.o. F admitted to pulmonary rehab for pulmonary fibrosis. PMHx includes HTN, HLD, CHF, PAF, COPD, gatroparesis, hypothyroidism, T2DM, rheumatoid  arthritis, former smoker, anxiety/depression, bipolar disorder. Relevant medications reviewed 04/05/22. PYP Score: 44. Vegetables & Fruits 9/12. Breads, Grains & Cereals 7/12. Red & Processed Meat 7/12. Poultry 2/2. Fish & Shellfish 1/4. Beans, Nuts & Seeds 1/4. Milk & Dairy Foods 1/6. Toppings, Oils, Seasonings & Salt 3/20. Sweets, Snacks & Restaurant Food 8/14. Beverages 5/10. [name] [name] feels that her downfall is regular soda - 35 oz/day - she has cut down, she used to drink 2L/day; she continues to cut back her soda with body armor lyte (no added sugar) B: buttered toast (white bread) L: does not usually eat lunch D: cube steak and gravy, pork chops, chicken, spaghetti and vegetables: green beans, turnip greens, corn, green peas. [name] [name] reports using salt with cooking and will normally shake it into her food. [name] [name] reports using bacon grease or butter when she cooks. [name] [name] reports eating out 3x/week. She has seen providers previously about nutrition and feels she has a good knowledge base - reviewed MyPlate, T2DM MNT, and general heart healthy eating. Encouraged to limit sodium/read food labels, practice MyPlate guidelines, cut down more on regular soda, and swap out her bacon grease and butter for liquid plant oils when cooking.      Intervention Plan   Intervention Prescribe, educate and counsel regarding individualized specific dietary modifications aiming towards targeted core components such as weight, hypertension, lipid management, diabetes, heart failure and other comorbidities.;Nutrition handout(s) given to patient.    Expected Outcomes Short Term Goal: Understand basic principles of dietary content, such as calories, fat, sodium, cholesterol and nutrients.;Short Term Goal: A plan has been developed with personal nutrition goals set during dietitian appointment.;Long Term Goal: Adherence to prescribed nutrition plan.             Nutrition Assessments:  MEDIFICTS Score Key: ?70 Need to make  dietary changes  40-70 Heart Healthy Diet ? 40 Therapeutic Level Cholesterol Diet  Flowsheet Row Pulmonary Rehab from 04/05/2022 in [facility]  Picture Your Plate Total Score on Admission 44      Picture Your Plate Scores: <16 Unhealthy dietary pattern with much room for improvement. 41-50 Dietary pattern unlikely to meet recommendations for good health and room for improvement. 51-60 More healthful dietary pattern, with some room for improvement.  >60 Healthy dietary pattern, although there may be some specific behaviors that could be improved.   Nutrition Goals Re-Evaluation:  Nutrition Goals Re-Evaluation     Row Name 05/05/22 1623 05/24/22 1612 06/28/22 1620 07/05/22 1641 08/23/22 1615     Goals   Current Weight 323 lb (146.5 kg) 328 lb (148.8 kg) 330 lb (149.7 kg) -- 344 lb (156 kg)   Nutrition Goal eat smaller portions Eat smaller frequent meals Eat more frequent meals. ST: limit sodium/read food labels, practice MyPlate guidelines, cut down more on regular soda, and swap out her bacon grease and butter for liquid plant oils when cooking. LT: limit Na <2g/day, limit added sugar <24g/day, limit saturated fat <12g/day, follow MyPlate guidelines. Lose some weight.   Comment Patient was informed on why it is important to maintain a balanced diet when dealing with Respiratory issues. Explained that it takes a lot of energy  to breath and when they are short of breath often they will need to have a good diet to help keep up with the calories they are expending for breathing. [name] [name] states that she sometimes eats once a day. She mostly eats at dinner time. Informed her that it would be benificial for her to eat smaller frequent meals to help aid in weight loss and appetite. She says that it is hard to get in that habit of eating. [name] [name] states she eats cubed steak with gravy and green beans, another night would be Hot dogs or something. Informed her that if she is eating  alot of sodium filled foods that it could cause her to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 of  220 pounds. Short: Call doctor and get answers on gatroparesis flare ups Long: Continue to manage lifestyle risk factors Short: Attend [facility] regularly to improve shortness of breath with ADL's. Long: maintain independence with ADL's    Row Name 08/23/22 1609             Core Components/Risk Factors/Patient Goals Review   Personal Goals Review Weight Management/Obesity;Other       Review [name] [name] has been gaining weight and she states she has some fluid build up. She weighed in at 344 pounds today. She was out of Lasix and has messaged her doctor for refils. She has been a little more short of breath since her weight gain.       Expected Outcomes Short: use lLasix when available. Long:  adhere to Lasix regimine and lose weight.  Core Components/Risk Factors/Patient Goals at Discharge (Final Review):   Goals and Risk Factor Review - 08/23/22 1609       Core Components/Risk Factors/Patient Goals Review   Personal Goals Review Weight  Management/Obesity;Other    Review [name] [name] has been gaining weight and she states she has some fluid build up. She weighed in at 344 pounds today. She was out of Lasix and has messaged her doctor for refils. She has been a little more short of breath since her weight gain.    Expected Outcomes Short: use lLasix when available. Long:  adhere to Lasix regimine and lose weight.             ITP Comments:  ITP Comments     Row Name 03/31/22 1420 04/05/22 1657 04/07/22 1623 04/14/22 0959 05/12/22 0910   ITP Comments Virtual Visit completed. Patient informed on EP and RD appointment and 6 Minute walk test. Patient also informed of patient health questionnaires on My Chart. Patient Verbalizes understanding. Visit diagnosis can be found in [facility] 03/29/2022. Completed and gym orientation. Initial ITP created and sent for review to Dr. Jinny Sanders, Medical Director. First full day of exercise!  Patient was oriented to gym and equipment including functions, settings, policies, and procedures.  Patient's individual exercise prescription and treatment plan were reviewed.  All starting workloads were established based on the results of the 6 minute walk test done at initial orientation visit.  The plan for exercise progression was also introduced and progression will be customized based on patient's performance and goals. 30 Day review completed. Medical Director ITP review done, changes made as directed, and signed approval by Medical Director.    new to program 30 Day review completed. Medical Director ITP review done, changes made as directed, and signed approval by Medical Director.    Row Name 06/09/22 1217 07/07/22 0806 08/04/22 0829 09/01/22 0814     ITP Comments 30 day review completed. ITP sent to Dr. Jinny Sanders, Medical Director of  Pulmonary Rehab. Continue with ITP unless changes are made by physician. 30 Day review completed. Medical Director ITP review done, changes made as directed, and  signed approval by Medical Director. 30 day review completed. ITP sent to Dr. Jinny Sanders, Medical Director of  Pulmonary Rehab. Continue with ITP unless changes are made by physician. 30 Day review completed. Medical Director ITP review done, changes made as directed, and signed approval by Medical Director.             Comments: Discharge ITP

## 2022-09-22 NOTE — Patient Instructions (Addendum)
Discharge Patient Instructions  Patient Details  Name: [name] [name] MRN: [id number] Date of Birth: [birth year] Referring Provider:  Etheleen Nicks, NP   Number of Visits: 30  Reason for Discharge:  Patient reached a stable level of exercise. Patient independent in their exercise. Patient has met program and personal goals.  Smoking History:  Social History   Tobacco Use  Smoking Status Former   Packs/day: 2.00   Years: 27.00   Additional pack years: 0.00   Total pack years: 54.00   Types: Cigarettes   Quit date: 07/30/1999   Years since quitting: 23.1  Smokeless Tobacco Never  Tobacco Comments   quit in 2001 2-2.5 a day    Diagnosis:  Pulmonary Fibrosis  Initial Exercise Prescription:  Initial Exercise Prescription - 04/05/22 1700       Date of Initial Exercise RX and Referring Provider   Date 04/05/22    Referring Provider Francene Boyers      Oxygen   Oxygen Continuous    Liters 3    Maintain Oxygen Saturation 88% or higher      NuStep   Level 1    SPM 80    Minutes 15    METs 1      Biostep-RELP   Level 1    SPM 80    Minutes 15    METs 1      Track   Laps 5    Minutes 15    METs 1.27      Prescription Details   Frequency (times per week) 2    Duration Progress to 30 minutes of continuous aerobic without signs/symptoms of physical distress      Intensity   THRR 40-80% of Max Heartrate 101-140    Ratings of Perceived Exertion 11-13    Perceived Dyspnea 0-4      Progression   Progression Continue to progress workloads to maintain intensity without signs/symptoms of physical distress.      Resistance Training   Training Prescription Yes    Weight 2    Reps 10-15             Discharge Exercise Prescription (Final Exercise Prescription Changes):  Exercise Prescription Changes - 09/15/22 0800       Response to Exercise   Blood Pressure (Admit) 118/64    Blood Pressure (Exit) 102/62    Heart Rate (Admit) 77 bpm    Heart Rate  (Exercise) 78 bpm    Heart Rate (Exit) 70 bpm    Oxygen Saturation (Admit) 94 %    Oxygen Saturation (Exercise) 90 %    Oxygen Saturation (Exit) 96 %    Rating of Perceived Exertion (Exercise) 13    Perceived Dyspnea (Exercise) 2    Symptoms SOB    Duration Continue with 30 min of aerobic exercise without signs/symptoms of physical distress.    Intensity THRR unchanged      Progression   Progression Continue to progress workloads to maintain intensity without signs/symptoms of physical distress.    Average METs 1.72      Resistance Training   Training Prescription Yes    Weight 2 lb    Reps 10-15      Interval Training   Interval Training No      Oxygen   Oxygen Continuous    Liters 4      Treadmill   MPH 0.5    Grade 0    Minutes 15    METs 1.38  NuStep   Level 2    Minutes 15    METs 2.1      Home Exercise Plan   Plans to continue exercise at Home (comment)   walking, staff videos, seated chair exercises, possibly going to [employer] Add 3 additional days to program exercise sessions.   start with 1 day, slowly build up to it   Initial Home Exercises Provided 07/05/22      Oxygen   Maintain Oxygen Saturation 88% or higher             Functional Capacity:  6 Minute Walk     Row Name 04/05/22 1659 09/22/22 1625       6 Minute Walk   Phase Initial Discharge    Distance 530 feet 600 feet    Distance % Change -- 13.2 %    Distance Feet Change -- 70 ft    Walk Time 6 minutes 5 minutes    # of Rest Breaks 0 1    MPH 1 1.36    METS 1 1    RPE 15 15    Perceived Dyspnea  3 3    VO2 Peak 2 1.92    Symptoms Yes (comment) Yes (comment)    Comments SOB Getting over being sick, still felt weak    Resting HR 62 bpm 74 bpm    Resting BP 120/70 112/64    Resting Oxygen Saturation  95 % 95 %    Exercise Oxygen Saturation  during 6 min walk 91 % 89 %    Max Ex. HR 77 bpm 89 bpm    Max Ex. BP 130/70 128/70    2 Minute Post BP 118/82 122/82       Interval HR   1 Minute HR 69 75    2 Minute HR 75 77    3 Minute HR 77 74    4 Minute HR 77 82    5 Minute HR 77 89    6 Minute HR 73 89    2 Minute Post HR 69 75    Interval Heart Rate? Yes Yes      Interval Oxygen   Interval Oxygen? Yes Yes    Baseline Oxygen Saturation % 95 % 95 %    1 Minute Oxygen Saturation % 95 % 93 %    1 Minute Liters of Oxygen 3 L 4 L    2 Minute Oxygen Saturation % 91 % 92 %    2 Minute Liters of Oxygen 3 L 4 L    3 Minute Oxygen Saturation % 91 % 89 %    3 Minute Liters of Oxygen 3 L 4 L    4 Minute Oxygen Saturation % 92 % 90 %    4 Minute Liters of Oxygen 3 L 4 L    5 Minute Oxygen Saturation % 91 % 90 %    5 Minute Liters of Oxygen 3 L 4 L    6 Minute Oxygen Saturation % 92 % 93 %    6 Minute Liters of Oxygen 3 L 4 L    2 Minute Post Oxygen Saturation % 97 % 94 %    2 Minute Post Liters of Oxygen 3 L 4 L            Nutrition & Weight - Outcomes:  Pre Biometrics - 04/05/22 1710       Pre Biometrics   Height 5' 6.75" (1.695 m)  Weight 318 lb 1.6 oz (144.3 kg)    BMI (Calculated) 50.22    Single Leg Stand 0 seconds             Post Biometrics - 09/22/22 1631        Post  Biometrics   Height 5' 6.75" (1.695 m)    Weight 338 lb (153.3 kg)    BMI (Calculated) 53.36    Single Leg Stand 0 seconds             Nutrition:  Nutrition Therapy & Goals - 04/05/22 1546       Nutrition Therapy   Diet Heart healthy, low Na, T2DM MNT    Drug/Food Interactions Statins/Certain Fruits    Protein (specify units) 110g    Fiber 25 grams    Whole Grain Foods 3 servings    Saturated Fats 12 max. grams    Fruits and Vegetables 8 servings/day    Sodium 2 grams      Personal Nutrition Goals   Nutrition Goal ST: limit sodium/read food labels, practice MyPlate guidelines, cut down more on regular soda, and swap out her bacon grease and butter for liquid plant oils when cooking. LT: limit Na <2g/day, limit added sugar <24g/day, limit  saturated fat <12g/day, follow MyPlate guidelines.    Comments 62 y.o. F admitted to pulmonary rehab for pulmonary fibrosis. PMHx includes HTN, HLD, CHF, PAF, COPD, gatroparesis, hypothyroidism, T2DM, rheumatoid arthritis, former smoker, anxiety/depression, bipolar disorder. Relevant medications reviewed 61/18/23. PYP Score: 44. Vegetables & Fruits 9/12. Breads, Grains & Cereals 7/12. Red & Processed Meat 7/12. Poultry 2/2. Fish & Shellfish 1/4. Beans, Nuts & Seeds 1/4. Milk & Dairy Foods 1/6. Toppings, Oils, Seasonings & Salt 3/20. Sweets, Snacks & Restaurant Food 8/14. Beverages 5/10. [name] feels that her downfall is regular soda - 35 oz/day - she has cut down, she used to drink 2L/day; she continues to cut back her soda with body armor lyte (no added sugar) B: buttered toast (white bread) L: does not usually eat lunch D: cube steak and gravy, pork chops, chicken, spaghetti and vegetables: green beans, turnip greens, corn, green peas. [name] reports using salt with cooking and will normally shake it into her food. [name] reports using bacon grease or butter when she cooks. [name] reports eating out 3x/week. She has seen providers previously about nutrition and feels she has a good knowledge base - reviewed MyPlate, T2DM MNT, and general heart healthy eating. Encouraged to limit sodium/read food labels, practice MyPlate guidelines, cut down more on regular soda, and swap out her bacon grease and butter for liquid plant oils when cooking.      Intervention Plan   Intervention Prescribe, educate and counsel regarding individualized specific dietary modifications aiming towards targeted core components such as weight, hypertension, lipid management, diabetes, heart failure and other comorbidities.;Nutrition handout(s) given to patient.    Expected Outcomes Short Term Goal: Understand basic principles of dietary content, such as calories, fat, sodium, cholesterol and nutrients.;Short Term Goal: A plan has been  developed with personal nutrition goals set during dietitian appointment.;Long Term Goal: Adherence to prescribed nutrition plan.

## 2022-09-22 NOTE — Progress Notes (Signed)
Discharge Summary: [name] [name]       DOB [birth year]   [name] [name] graduated today from  rehab with 30 sessions completed.  Details of the patient's exercise prescription and what She needs to do in order to continue the prescription and progress were discussed with patient.  Patient was given a copy of prescription and goals.  Patient verbalized understanding.  [name] plans to continue to exercise by continuing at home and possibly going to [employer].   6 Minute Walk     Row Name 04/05/22 1659 09/22/22 1625       6 Minute Walk   Phase Initial Discharge    Distance 530 feet 600 feet    Distance % Change -- 13.2 %    Distance Feet Change -- 70 ft    Walk Time 6 minutes 5 minutes    # of Rest Breaks 0 1    MPH 1 1.36    METS 1 1    RPE 15 15    Perceived Dyspnea  3 3    VO2 Peak 2 1.92    Symptoms Yes (comment) Yes (comment)    Comments SOB Getting over being sick, still felt weak    Resting HR 62 bpm 74 bpm    Resting BP 120/70 112/64    Resting Oxygen Saturation  95 % 95 %    Exercise Oxygen Saturation  during 6 min walk 91 % 89 %    Max Ex. HR 77 bpm 89 bpm    Max Ex. BP 130/70 128/70    2 Minute Post BP 118/82 122/82      Interval HR   1 Minute HR 69 75    2 Minute HR 75 77    3 Minute HR 77 74    4 Minute HR 77 82    5 Minute HR 77 89    6 Minute HR 73 89    2 Minute Post HR 69 75    Interval Heart Rate? Yes Yes      Interval Oxygen   Interval Oxygen? Yes Yes    Baseline Oxygen Saturation % 95 % 95 %    1 Minute Oxygen Saturation % 95 % 93 %    1 Minute Liters of Oxygen 3 L 4 L    2 Minute Oxygen Saturation % 91 % 92 %    2 Minute Liters of Oxygen 3 L 4 L    3 Minute Oxygen Saturation % 91 % 89 %    3 Minute Liters of Oxygen 3 L 4 L    4 Minute Oxygen Saturation % 92 % 90 %    4 Minute Liters of Oxygen 3 L 4 L    5 Minute Oxygen Saturation % 91 % 90 %    5 Minute Liters of Oxygen 3 L 4 L    6 Minute Oxygen Saturation % 92 % 93 %    6 Minute Liters of  Oxygen 3 L 4 L    2 Minute Post Oxygen Saturation % 97 % 94 %    2 Minute Post Liters of Oxygen 3 L 4 L

## 2022-09-24 MED ORDER — LEVOTHYROXINE 88 MCG TABLET
ORAL_TABLET | Freq: Every day | ORAL | 1 refills | 90 days | Status: CP
Start: 2022-09-24 — End: 2023-09-24

## 2022-09-28 ENCOUNTER — Other Ambulatory Visit: Payer: Medicaid Other | Admitting: *Deleted
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
## 2022-09-28 NOTE — Patient Outreach (Signed)
Care Management/Care Coordination  RN Case Manager Case Closure Note  09/28/2022 Name: [name] [name] MRN: [id number] DOB: Mar 07, 1961  [name] [name] is a 62 y.o. year old female who is a primary care patient of Etheleen Nicks, NP. The care management/care coordination team was consulted for assistance with chronic disease management and/or care coordination needs.   Care Plan : RN Care Manager Plan of Care  Updates made by Heidi Dach, RN since 09/28/2022 12:00 AM  Completed 09/28/2022   Problem: Health Management needs related to COPD and Mental Health Resolved 09/28/2022     Long-Range Goal: Development of Plan of Care To address Health Management needs related to COPD and Mental Health Completed 09/28/2022  Start Date: 03/17/2022  Expected End Date: 06/15/2022  Priority: High  Note:   Current Barriers:  Chronic Disease Management support and education needs related to COPD and Mental Health  Patient lost to follow up. RNCM connected with [name] [name]. Patient denies any needs and declines CM at this time.   RNCM Clinical Goal(s):  Patient will verbalize understanding of plan for management of COPD and Mental Health as evidenced by patient reports take all medications exactly as prescribed and will call provider for medication related questions as evidenced by patient reports and EMR documentation    attend all scheduled medical appointments: 05/20/22 with Heart and Vascular, 05/28/22 with PCP, 06/25/22 with Rheumatology and 06/29/22 with Endocrinology as evidenced by Provider documentation        continue to work with RN Care Manager and/or Social Worker to address care management and care coordination needs related to COPD and Mental Health as evidenced by adherence to CM Team Scheduled appointments     through collaboration with RN Care manager, provider, and care team.   Interventions: Inter-disciplinary care team collaboration (see longitudinal plan of care) Evaluation of current  treatment plan related to  self management and patient's adherence to plan as established by provider Advised patient to discuss case management needs with PCP should new needs/barriers arise  COPD: (Status: Patient declined further engagement on this goal.) Long Term Goal  Reviewed medications with patient, including use of prescribed maintenance and rescue inhalers, and provided instruction on medication management and the importance of adherence Provided patient with basic written and verbal COPD education on self care/management/and exacerbation prevention Provided instruction about proper use of medications used for management of COPD including inhalers Advised patient to self assesses COPD action plan zone and make appointment with provider if in the yellow zone for 48 hours without improvement Discussed the importance of adequate rest and management of fatigue with COPD Screening for signs and symptoms of depression related to chronic disease state  Assessed social determinant of health barriers    Mental Health  (Status:  Patient declined further engagement on this goal.)  Long Term Goal-Patient working with [name], LCSW Evaluation of current treatment plan related to  Mental Health ,  self-management and patient's adherence to plan as established by provider. Discussed plans with patient for ongoing care management follow up and provided patient with direct contact information for care management team Reviewed medications with patient and discussed Ambien is on medication list, but has not been dispensed since 10/2021, patient not sleeping at night Screening for signs and symptoms of depression related to chronic disease state  Assessed social determinant of health barriers Advised patient to schedule follow up with Psychiatry, discuss needing ambien   Patient Goals/Self-Care Activities: Take medications as prescribed   Attend  all scheduled provider appointments Call provider office  for new concerns or questions  Work with the social worker to address care coordination needs and will continue to work with the clinical team to address health care and disease management related needs call 1-800-273-TALK (toll free, 24 hour hotline) go to [facility] [address], [place] [phone]) call 911 if experiencing a Mental Health or Behavioral Health Crisis  avoid second hand smoke limit outdoor activity during cold weather develop a new routine to improve sleep practice relaxation or meditation daily Discuss pulmonary rehab with provider Cut back on sugary drinks       Plan: The patient declines further follow up and engagement by the care management team and the case will be closed. Appropriate care team members and provider have been notified via electronic communication and the patient has been provided contact information for the care management team. The care management team is available to at any time in the future should needs arise.   Estanislado Emms RN, BSN [place]  Managed [facility] RN Care Coordinator [phone]

## 2022-10-04 ENCOUNTER — Encounter: Payer: Self-pay | Admitting: Psychiatry

## 2022-10-04 ENCOUNTER — Ambulatory Visit (INDEPENDENT_AMBULATORY_CARE_PROVIDER_SITE_OTHER): Payer: Medicaid Other | Admitting: Psychiatry

## 2022-10-04 VITALS — BP 113/75 | HR 59 | Temp 97.4°F | Ht 66.75 in | Wt 346.4 lb

## 2022-10-04 DIAGNOSIS — F4310 Post-traumatic stress disorder, unspecified: Secondary | ICD-10-CM | POA: Diagnosis not present

## 2022-10-04 DIAGNOSIS — G4700 Insomnia, unspecified: Secondary | ICD-10-CM | POA: Diagnosis not present

## 2022-10-04 DIAGNOSIS — F331 Major depressive disorder, recurrent, moderate: Secondary | ICD-10-CM

## 2022-10-04 NOTE — Patient Instructions (Signed)
Continue Fluoxetine 40 mg daily Obtain EKG Will plan to increase buspirone to 10 mg three times a day after reviewing EKG  Continue Abilify 5 mg daily Continue Quetiapine 300 mg at night Continue Ambien 5 mg at night  Next appointment: 8/15 at 1 pm

## 2022-10-15 MED ORDER — METOCLOPRAMIDE 5 MG/5 ML ORAL SOLUTION
ORAL | 3 refills | 17 days | Status: CP
Start: 2022-10-15 — End: 2022-12-23

## 2022-10-18 ENCOUNTER — Other Ambulatory Visit: Payer: Self-pay | Admitting: Psychiatry

## 2022-10-18 ENCOUNTER — Telehealth: Payer: Self-pay

## 2022-10-18 NOTE — Telephone Encounter (Signed)
Received fax from pharmacy requesting 90 day refills for the following medications   ARIPiprazole (ABILIFY) 5 MG tablet   busPIRone (BUSPAR) 10 MG tablet    Pharmacy  [id number] [place], [place] - [address] Phone: [phone]  Fax: [phone]

## 2022-10-18 NOTE — Telephone Encounter (Signed)
I have reviewed the note. There should be a refill ordered by Dr. Karle Plumber, which should last until September. Could you please verify this with the pharmacy? Also, please contact the patient and remind her to obtain an EKG at [facility] to possibly adjust her medication.

## 2022-10-18 NOTE — Telephone Encounter (Signed)
Called patient to inform of EKG gave patient the number to call and get scheduled she voiced understanding

## 2022-11-05 ENCOUNTER — Ambulatory Visit: Payer: Medicaid Other | Admitting: Cardiology

## 2022-11-05 MED ORDER — METOPROLOL TARTRATE 25 MG TABLET
ORAL_TABLET | Freq: Two times a day (BID) | ORAL | 1 refills | 90 days | Status: CP
Start: 2022-11-05 — End: 2023-11-05

## 2022-11-05 MED ORDER — FAMOTIDINE 20 MG TABLET
ORAL_TABLET | Freq: Every day | ORAL | 1 refills | 90 days | Status: CP
Start: 2022-11-05 — End: 2023-11-05

## 2022-11-27 NOTE — Progress Notes (Signed)
No show

## 2022-11-28 ENCOUNTER — Other Ambulatory Visit: Payer: Self-pay | Admitting: Student in an Organized Health Care Education/Training Program

## 2022-11-28 DIAGNOSIS — E1142 Type 2 diabetes mellitus with diabetic polyneuropathy: Secondary | ICD-10-CM

## 2022-11-28 DIAGNOSIS — G8929 Other chronic pain: Secondary | ICD-10-CM

## 2022-11-28 DIAGNOSIS — G894 Chronic pain syndrome: Secondary | ICD-10-CM

## 2022-11-28 DIAGNOSIS — M792 Neuralgia and neuritis, unspecified: Secondary | ICD-10-CM

## 2022-11-28 MED ORDER — METOCLOPRAMIDE 5 MG/5 ML ORAL SOLUTION
3 refills | 0 days
Start: 2022-11-28 — End: ?

## 2022-12-02 ENCOUNTER — Ambulatory Visit (INDEPENDENT_AMBULATORY_CARE_PROVIDER_SITE_OTHER): Payer: Medicaid Other | Admitting: Psychiatry

## 2022-12-02 DIAGNOSIS — Z91199 Patient's noncompliance with other medical treatment and regimen due to unspecified reason: Secondary | ICD-10-CM

## 2022-12-03 DIAGNOSIS — M0579 Rheumatoid arthritis with rheumatoid factor of multiple sites without organ or systems involvement: Principal | ICD-10-CM

## 2022-12-03 MED ORDER — METHOTREXATE SODIUM 2.5 MG TABLET
ORAL_TABLET | ORAL | 2 refills | 28 days
Start: 2022-12-03 — End: ?

## 2022-12-03 MED ORDER — PRAMIPEXOLE 0.125 MG TABLET
ORAL_TABLET | Freq: Every evening | ORAL | 2 refills | 30 days | Status: CP
Start: 2022-12-03 — End: 2023-12-03

## 2022-12-13 ENCOUNTER — Ambulatory Visit: Payer: Medicaid Other | Attending: Cardiology | Admitting: Cardiology

## 2022-12-14 ENCOUNTER — Encounter: Payer: Self-pay | Admitting: Cardiology

## 2022-12-15 DIAGNOSIS — M0579 Rheumatoid arthritis with rheumatoid factor of multiple sites without organ or systems involvement: Principal | ICD-10-CM

## 2022-12-16 DIAGNOSIS — M0579 Rheumatoid arthritis with rheumatoid factor of multiple sites without organ or systems involvement: Principal | ICD-10-CM

## 2022-12-16 MED ORDER — METHOTREXATE SODIUM 2.5 MG TABLET
ORAL_TABLET | ORAL | 2 refills | 28 days | Status: CP
Start: 2022-12-16 — End: ?

## 2022-12-17 ENCOUNTER — Ambulatory Visit: Admit: 2022-12-17 | Discharge: 2022-12-18 | Payer: PRIVATE HEALTH INSURANCE | Attending: Family | Primary: Family

## 2022-12-17 DIAGNOSIS — Z9981 Dependence on supplemental oxygen: Principal | ICD-10-CM

## 2022-12-17 DIAGNOSIS — E1165 Type 2 diabetes mellitus with hyperglycemia: Principal | ICD-10-CM

## 2022-12-17 DIAGNOSIS — M0579 Rheumatoid arthritis with rheumatoid factor of multiple sites without organ or systems involvement: Principal | ICD-10-CM

## 2022-12-17 DIAGNOSIS — E039 Hypothyroidism, unspecified: Principal | ICD-10-CM

## 2022-12-17 DIAGNOSIS — E782 Mixed hyperlipidemia: Principal | ICD-10-CM

## 2022-12-17 DIAGNOSIS — R635 Abnormal weight gain: Principal | ICD-10-CM

## 2022-12-17 DIAGNOSIS — Z1231 Encounter for screening mammogram for malignant neoplasm of breast: Principal | ICD-10-CM

## 2022-12-17 DIAGNOSIS — Z794 Long term (current) use of insulin: Principal | ICD-10-CM

## 2022-12-17 MED ORDER — PRAMIPEXOLE 0.5 MG TABLET
ORAL_TABLET | Freq: Every evening | ORAL | 3 refills | 90 days | Status: CP
Start: 2022-12-17 — End: 2023-12-16

## 2022-12-17 MED ORDER — ATORVASTATIN 80 MG TABLET
ORAL | 1 refills | 90 days | Status: CP
Start: 2022-12-17 — End: 2023-12-17

## 2022-12-17 MED ORDER — ARIPIPRAZOLE 5 MG TABLET
ORAL_TABLET | Freq: Every day | ORAL | 3 refills | 90 days | Status: CP
Start: 2022-12-17 — End: 2023-12-17

## 2022-12-17 MED ORDER — METFORMIN ER 500 MG TABLET,EXTENDED RELEASE 24 HR
ORAL_TABLET | 11 refills | 0.00000 days | Status: CP
Start: 2022-12-17 — End: ?

## 2022-12-21 ENCOUNTER — Ambulatory Visit: Admit: 2022-12-21 | Discharge: 2022-12-22 | Payer: PRIVATE HEALTH INSURANCE

## 2022-12-21 DIAGNOSIS — L97522 Non-pressure chronic ulcer of other part of left foot with fat layer exposed: Principal | ICD-10-CM

## 2022-12-21 DIAGNOSIS — L03032 Cellulitis of left toe: Principal | ICD-10-CM

## 2022-12-21 DIAGNOSIS — E11621 Type 2 diabetes mellitus with foot ulcer: Principal | ICD-10-CM

## 2022-12-21 DIAGNOSIS — M2042 Other hammer toe(s) (acquired), left foot: Principal | ICD-10-CM

## 2022-12-21 DIAGNOSIS — M2041 Other hammer toe(s) (acquired), right foot: Principal | ICD-10-CM

## 2022-12-21 MED ORDER — LEVOFLOXACIN 750 MG TABLET
ORAL_TABLET | Freq: Every day | ORAL | 0 refills | 7 days | Status: CP
Start: 2022-12-21 — End: 2022-12-28

## 2022-12-24 ENCOUNTER — Ambulatory Visit
Admit: 2022-12-24 | Discharge: 2022-12-26 | Disposition: A | Discharge status: Home / Self Care | Payer: PRIVATE HEALTH INSURANCE

## 2022-12-26 MED ORDER — METOCLOPRAMIDE 5 MG/5 ML ORAL SOLUTION
ORAL | 3 refills | 17 days | Status: CP
Start: 2022-12-26 — End: 2023-03-05

## 2022-12-26 MED ORDER — AMOXICILLIN 500 MG CAPSULE
ORAL_CAPSULE | Freq: Three times a day (TID) | ORAL | 0 refills | 7 days | Status: CP
Start: 2022-12-26 — End: 2023-01-02

## 2022-12-28 ENCOUNTER — Ambulatory Visit: Admit: 2022-12-28 | Payer: PRIVATE HEALTH INSURANCE

## 2023-01-06 DIAGNOSIS — E782 Mixed hyperlipidemia: Principal | ICD-10-CM

## 2023-01-06 MED ORDER — ATORVASTATIN 80 MG TABLET
ORAL_TABLET | Freq: Every evening | ORAL | 1 refills | 0 days
Start: 2023-01-06 — End: ?

## 2023-01-06 MED ORDER — AMLODIPINE 10 MG TABLET
ORAL_TABLET | Freq: Every day | ORAL | 3 refills | 0 days
Start: 2023-01-06 — End: ?

## 2023-01-07 MED ORDER — AMLODIPINE 10 MG TABLET
ORAL | 1 refills | 90 days | Status: CP
Start: 2023-01-07 — End: 2024-01-07

## 2023-01-07 MED ORDER — ATORVASTATIN 80 MG TABLET
ORAL_TABLET | Freq: Every evening | ORAL | 1 refills | 90 days
Start: 2023-01-07 — End: ?

## 2023-01-10 ENCOUNTER — Ambulatory Visit
Admit: 2023-01-10 | Discharge: 2023-01-19 | Disposition: A | Discharge status: Home / Self Care | Payer: PRIVATE HEALTH INSURANCE

## 2023-01-10 ENCOUNTER — Ambulatory Visit: Admit: 2023-01-10 | Payer: PRIVATE HEALTH INSURANCE

## 2023-01-11 MED ORDER — LYRICA 150 MG CAPSULE
ORAL_CAPSULE | Freq: Three times a day (TID) | ORAL | 0 refills | 2 days | Status: CP
Start: 2023-01-11 — End: 2023-01-11

## 2023-01-11 MED ORDER — PREGABALIN 100 MG CAPSULE
ORAL_CAPSULE | Freq: Three times a day (TID) | ORAL | 0 refills | 2 days | Status: CP
Start: 2023-01-11 — End: 2023-01-13

## 2023-01-13 DIAGNOSIS — A4902 Methicillin resistant Staphylococcus aureus infection, unspecified site: Principal | ICD-10-CM

## 2023-01-13 MED ORDER — PREGABALIN 100 MG CAPSULE
ORAL_CAPSULE | Freq: Three times a day (TID) | ORAL | 0 refills | 2 days | Status: CP
Start: 2023-01-13 — End: 2023-01-15

## 2023-01-15 MED ORDER — PREGABALIN 100 MG CAPSULE
ORAL_CAPSULE | Freq: Three times a day (TID) | ORAL | 0 refills | 2 days | Status: CP
Start: 2023-01-15 — End: 2023-01-17

## 2023-01-18 DIAGNOSIS — A4902 Methicillin resistant Staphylococcus aureus infection, unspecified site: Principal | ICD-10-CM

## 2023-01-18 MED ORDER — PREGABALIN 100 MG CAPSULE
ORAL_CAPSULE | Freq: Three times a day (TID) | ORAL | 0 refills | 2 days | Status: CP
Start: 2023-01-18 — End: 2023-01-20

## 2023-01-19 MED ORDER — PREGABALIN 100 MG CAPSULE
ORAL_CAPSULE | Freq: Three times a day (TID) | ORAL | 0 refills | 34 days | Status: CP
Start: 2023-01-19 — End: 2023-02-22

## 2023-01-22 ENCOUNTER — Encounter: Admit: 2023-01-22 | Discharge: 2023-01-22 | Payer: PRIVATE HEALTH INSURANCE

## 2023-01-22 DIAGNOSIS — M869 Osteomyelitis, unspecified: Principal | ICD-10-CM

## 2023-01-22 DIAGNOSIS — A419 Sepsis, unspecified organism: Principal | ICD-10-CM

## 2023-01-25 ENCOUNTER — Ambulatory Visit: Admit: 2023-01-25 | Discharge: 2023-01-26 | Payer: PRIVATE HEALTH INSURANCE

## 2023-01-25 DIAGNOSIS — M2042 Other hammer toe(s) (acquired), left foot: Principal | ICD-10-CM

## 2023-01-25 DIAGNOSIS — L84 Corns and callosities: Principal | ICD-10-CM

## 2023-01-25 DIAGNOSIS — M79672 Pain in left foot: Principal | ICD-10-CM

## 2023-01-25 DIAGNOSIS — M2041 Other hammer toe(s) (acquired), right foot: Principal | ICD-10-CM

## 2023-01-26 ENCOUNTER — Ambulatory Visit
Admit: 2023-01-26 | Discharge: 2023-01-27 | Payer: PRIVATE HEALTH INSURANCE | Attending: Rehabilitative and Restorative Service Providers" | Primary: Rehabilitative and Restorative Service Providers"

## 2023-01-28 ENCOUNTER — Other Ambulatory Visit: Payer: Medicaid Other | Admitting: *Deleted

## 2023-01-28 NOTE — Patient Outreach (Signed)
Care Coordination  01/28/2023  [name] [name] February 09, 1961 [id number]   Successful telephone outreach with [name] today. During chart review, RNCM noted patient with [facility] PCP. RNCM verified PCP information with patient. Advised patient to notify [facility] of correct PCP information and request CM with [facility]. Patient denies any urgent needs today. Patient voiced understanding and will contact [facility] today.  Estanislado Emms RN, BSN [place]  [facility] RN Care Coordinator [phone]
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
## 2023-01-31 ENCOUNTER — Emergency Department: Payer: Medicaid Other

## 2023-01-31 ENCOUNTER — Other Ambulatory Visit: Payer: Self-pay

## 2023-01-31 ENCOUNTER — Encounter: Payer: Self-pay | Admitting: Emergency Medicine

## 2023-01-31 ENCOUNTER — Inpatient Hospital Stay
Admission: EM | Admit: 2023-01-31 | Discharge: 2023-02-04 | DRG: 871 | Disposition: A | Discharge status: Home Health | Payer: Medicaid Other | Attending: Internal Medicine | Admitting: Internal Medicine

## 2023-01-31 DIAGNOSIS — J8281 Chronic eosinophilic pneumonia: Secondary | ICD-10-CM | POA: Diagnosis present

## 2023-01-31 DIAGNOSIS — N1832 Chronic kidney disease, stage 3b: Secondary | ICD-10-CM | POA: Diagnosis present

## 2023-01-31 DIAGNOSIS — R509 Fever, unspecified: Secondary | ICD-10-CM | POA: Diagnosis present

## 2023-01-31 DIAGNOSIS — G928 Other toxic encephalopathy: Secondary | ICD-10-CM | POA: Diagnosis present

## 2023-01-31 DIAGNOSIS — E039 Hypothyroidism, unspecified: Secondary | ICD-10-CM | POA: Diagnosis present

## 2023-01-31 DIAGNOSIS — E1151 Type 2 diabetes mellitus with diabetic peripheral angiopathy without gangrene: Secondary | ICD-10-CM | POA: Diagnosis present

## 2023-01-31 DIAGNOSIS — M0579 Rheumatoid arthritis with rheumatoid factor of multiple sites without organ or systems involvement: Secondary | ICD-10-CM | POA: Diagnosis present

## 2023-01-31 DIAGNOSIS — Z5986 Financial insecurity: Secondary | ICD-10-CM

## 2023-01-31 DIAGNOSIS — Z87891 Personal history of nicotine dependence: Secondary | ICD-10-CM

## 2023-01-31 DIAGNOSIS — E1143 Type 2 diabetes mellitus with diabetic autonomic (poly)neuropathy: Secondary | ICD-10-CM | POA: Diagnosis present

## 2023-01-31 DIAGNOSIS — Z818 Family history of other mental and behavioral disorders: Secondary | ICD-10-CM

## 2023-01-31 DIAGNOSIS — F418 Other specified anxiety disorders: Secondary | ICD-10-CM | POA: Diagnosis present

## 2023-01-31 DIAGNOSIS — Z7901 Long term (current) use of anticoagulants: Secondary | ICD-10-CM

## 2023-01-31 DIAGNOSIS — R109 Unspecified abdominal pain: Secondary | ICD-10-CM | POA: Diagnosis present

## 2023-01-31 DIAGNOSIS — Z8249 Family history of ischemic heart disease and other diseases of the circulatory system: Secondary | ICD-10-CM

## 2023-01-31 DIAGNOSIS — I5033 Acute on chronic diastolic (congestive) heart failure: Secondary | ICD-10-CM | POA: Diagnosis not present

## 2023-01-31 DIAGNOSIS — N3000 Acute cystitis without hematuria: Secondary | ICD-10-CM | POA: Diagnosis present

## 2023-01-31 DIAGNOSIS — E1169 Type 2 diabetes mellitus with other specified complication: Secondary | ICD-10-CM | POA: Diagnosis present

## 2023-01-31 DIAGNOSIS — I10 Essential (primary) hypertension: Secondary | ICD-10-CM | POA: Diagnosis present

## 2023-01-31 DIAGNOSIS — Z811 Family history of alcohol abuse and dependence: Secondary | ICD-10-CM

## 2023-01-31 DIAGNOSIS — K3184 Gastroparesis: Secondary | ICD-10-CM | POA: Diagnosis present

## 2023-01-31 DIAGNOSIS — J189 Pneumonia, unspecified organism: Secondary | ICD-10-CM | POA: Diagnosis present

## 2023-01-31 DIAGNOSIS — Z882 Allergy status to sulfonamides status: Secondary | ICD-10-CM

## 2023-01-31 DIAGNOSIS — E1142 Type 2 diabetes mellitus with diabetic polyneuropathy: Secondary | ICD-10-CM | POA: Diagnosis present

## 2023-01-31 DIAGNOSIS — N179 Acute kidney failure, unspecified: Secondary | ICD-10-CM | POA: Diagnosis present

## 2023-01-31 DIAGNOSIS — Z8616 Personal history of COVID-19: Secondary | ICD-10-CM

## 2023-01-31 DIAGNOSIS — J9621 Acute and chronic respiratory failure with hypoxia: Secondary | ICD-10-CM | POA: Diagnosis present

## 2023-01-31 DIAGNOSIS — M86172 Other acute osteomyelitis, left ankle and foot: Secondary | ICD-10-CM | POA: Diagnosis not present

## 2023-01-31 DIAGNOSIS — Z969 Presence of functional implant, unspecified: Secondary | ICD-10-CM

## 2023-01-31 DIAGNOSIS — Z90710 Acquired absence of both cervix and uterus: Secondary | ICD-10-CM

## 2023-01-31 DIAGNOSIS — J449 Chronic obstructive pulmonary disease, unspecified: Secondary | ICD-10-CM | POA: Diagnosis present

## 2023-01-31 DIAGNOSIS — E785 Hyperlipidemia, unspecified: Secondary | ICD-10-CM | POA: Diagnosis present

## 2023-01-31 DIAGNOSIS — Z7984 Long term (current) use of oral hypoglycemic drugs: Secondary | ICD-10-CM

## 2023-01-31 DIAGNOSIS — E872 Acidosis, unspecified: Secondary | ICD-10-CM | POA: Diagnosis present

## 2023-01-31 DIAGNOSIS — M069 Rheumatoid arthritis, unspecified: Secondary | ICD-10-CM | POA: Diagnosis present

## 2023-01-31 DIAGNOSIS — Z6841 Body Mass Index (BMI) 40.0 and over, adult: Secondary | ICD-10-CM

## 2023-01-31 DIAGNOSIS — E11628 Type 2 diabetes mellitus with other skin complications: Secondary | ICD-10-CM

## 2023-01-31 DIAGNOSIS — J440 Chronic obstructive pulmonary disease with acute lower respiratory infection: Secondary | ICD-10-CM | POA: Diagnosis present

## 2023-01-31 DIAGNOSIS — I5032 Chronic diastolic (congestive) heart failure: Secondary | ICD-10-CM | POA: Diagnosis present

## 2023-01-31 DIAGNOSIS — R6520 Severe sepsis without septic shock: Secondary | ICD-10-CM | POA: Diagnosis present

## 2023-01-31 DIAGNOSIS — T368X5A Adverse effect of other systemic antibiotics, initial encounter: Secondary | ICD-10-CM | POA: Diagnosis present

## 2023-01-31 DIAGNOSIS — J8410 Pulmonary fibrosis, unspecified: Secondary | ICD-10-CM | POA: Diagnosis present

## 2023-01-31 DIAGNOSIS — Z950 Presence of cardiac pacemaker: Secondary | ICD-10-CM

## 2023-01-31 DIAGNOSIS — E1122 Type 2 diabetes mellitus with diabetic chronic kidney disease: Secondary | ICD-10-CM | POA: Diagnosis present

## 2023-01-31 DIAGNOSIS — I480 Paroxysmal atrial fibrillation: Secondary | ICD-10-CM | POA: Diagnosis present

## 2023-01-31 DIAGNOSIS — Z888 Allergy status to other drugs, medicaments and biological substances status: Secondary | ICD-10-CM

## 2023-01-31 DIAGNOSIS — J9601 Acute respiratory failure with hypoxia: Secondary | ICD-10-CM | POA: Diagnosis not present

## 2023-01-31 DIAGNOSIS — E11621 Type 2 diabetes mellitus with foot ulcer: Secondary | ICD-10-CM | POA: Diagnosis present

## 2023-01-31 DIAGNOSIS — Z881 Allergy status to other antibiotic agents status: Secondary | ICD-10-CM

## 2023-01-31 DIAGNOSIS — J9611 Chronic respiratory failure with hypoxia: Secondary | ICD-10-CM | POA: Diagnosis present

## 2023-01-31 DIAGNOSIS — Z9981 Dependence on supplemental oxygen: Secondary | ICD-10-CM

## 2023-01-31 DIAGNOSIS — A4902 Methicillin resistant Staphylococcus aureus infection, unspecified site: Secondary | ICD-10-CM | POA: Diagnosis not present

## 2023-01-31 DIAGNOSIS — I130 Hypertensive heart and chronic kidney disease with heart failure and stage 1 through stage 4 chronic kidney disease, or unspecified chronic kidney disease: Secondary | ICD-10-CM | POA: Diagnosis present

## 2023-01-31 DIAGNOSIS — A419 Sepsis, unspecified organism: Secondary | ICD-10-CM | POA: Diagnosis present

## 2023-01-31 DIAGNOSIS — N390 Urinary tract infection, site not specified: Secondary | ICD-10-CM | POA: Diagnosis present

## 2023-01-31 DIAGNOSIS — Z885 Allergy status to narcotic agent status: Secondary | ICD-10-CM

## 2023-01-31 DIAGNOSIS — N319 Neuromuscular dysfunction of bladder, unspecified: Secondary | ICD-10-CM | POA: Diagnosis present

## 2023-01-31 DIAGNOSIS — M869 Osteomyelitis, unspecified: Secondary | ICD-10-CM | POA: Diagnosis present

## 2023-01-31 DIAGNOSIS — Z79890 Hormone replacement therapy (postmenopausal): Secondary | ICD-10-CM

## 2023-01-31 DIAGNOSIS — D649 Anemia, unspecified: Secondary | ICD-10-CM | POA: Diagnosis present

## 2023-01-31 DIAGNOSIS — K219 Gastro-esophageal reflux disease without esophagitis: Secondary | ICD-10-CM | POA: Diagnosis present

## 2023-01-31 DIAGNOSIS — Z79899 Other long term (current) drug therapy: Secondary | ICD-10-CM

## 2023-01-31 DIAGNOSIS — L089 Local infection of the skin and subcutaneous tissue, unspecified: Secondary | ICD-10-CM | POA: Diagnosis not present

## 2023-01-31 DIAGNOSIS — R112 Nausea with vomiting, unspecified: Secondary | ICD-10-CM | POA: Diagnosis present

## 2023-01-31 DIAGNOSIS — Z9884 Bariatric surgery status: Secondary | ICD-10-CM

## 2023-01-31 DIAGNOSIS — L97529 Non-pressure chronic ulcer of other part of left foot with unspecified severity: Secondary | ICD-10-CM | POA: Diagnosis present

## 2023-01-31 DIAGNOSIS — E878 Other disorders of electrolyte and fluid balance, not elsewhere classified: Secondary | ICD-10-CM | POA: Diagnosis present

## 2023-01-31 LAB — PROCALCITONIN: Procalcitonin: 0.53 ng/mL

## 2023-01-31 LAB — PROTIME-INR
INR: 1.6 — ABNORMAL HIGH (ref 0.8–1.2)
Prothrombin Time: 19.3 s — ABNORMAL HIGH (ref 11.4–15.2)

## 2023-01-31 LAB — CBC WITH DIFFERENTIAL/PLATELET
Abs Immature Granulocytes: 0.06 10*3/uL (ref 0.00–0.07)
Basophils Absolute: 0 10*3/uL (ref 0.0–0.1)
Basophils Relative: 0 %
Eosinophils Absolute: 0 10*3/uL (ref 0.0–0.5)
Eosinophils Relative: 0 %
HCT: 35.1 % — ABNORMAL LOW (ref 36.0–46.0)
Hemoglobin: 10.3 g/dL — ABNORMAL LOW (ref 12.0–15.0)
Immature Granulocytes: 1 %
Lymphocytes Relative: 3 %
Lymphs Abs: 0.3 10*3/uL — ABNORMAL LOW (ref 0.7–4.0)
MCH: 22.2 pg — ABNORMAL LOW (ref 26.0–34.0)
MCHC: 29.3 g/dL — ABNORMAL LOW (ref 30.0–36.0)
MCV: 75.5 fL — ABNORMAL LOW (ref 80.0–100.0)
Monocytes Absolute: 0.2 10*3/uL (ref 0.1–1.0)
Monocytes Relative: 2 %
Neutro Abs: 9.3 10*3/uL — ABNORMAL HIGH (ref 1.7–7.7)
Neutrophils Relative %: 94 %
Platelets: 285 10*3/uL (ref 150–400)
RBC: 4.65 MIL/uL (ref 3.87–5.11)
RDW: 19.1 % — ABNORMAL HIGH (ref 11.5–15.5)
WBC: 9.9 10*3/uL (ref 4.0–10.5)
nRBC: 0 % (ref 0.0–0.2)

## 2023-01-31 LAB — SARS CORONAVIRUS 2 BY RT PCR: SARS Coronavirus 2 by RT PCR: NEGATIVE

## 2023-01-31 LAB — URINALYSIS, ROUTINE W REFLEX MICROSCOPIC
Bilirubin Urine: NEGATIVE
Glucose, UA: NEGATIVE mg/dL
Hgb urine dipstick: NEGATIVE
Ketones, ur: 5 mg/dL — AB
Nitrite: NEGATIVE
Protein, ur: 100 mg/dL — AB
Specific Gravity, Urine: 1.024 (ref 1.005–1.030)
WBC, UA: >50 WBC/hpf (ref 0–5)
pH: 5 (ref 5.0–8.0)

## 2023-01-31 LAB — C-REACTIVE PROTEIN: CRP: 5.1 mg/dL — ABNORMAL HIGH (ref <1.0)

## 2023-01-31 LAB — COMPREHENSIVE METABOLIC PANEL
ALT: 16 U/L (ref 0–44)
AST: 23 U/L (ref 15–41)
Albumin: 3.4 g/dL — ABNORMAL LOW (ref 3.5–5.0)
Alkaline Phosphatase: 72 U/L (ref 38–126)
Anion gap: 12 (ref 5–15)
BUN: 16 mg/dL (ref 8–23)
CO2: 19 mmol/L — ABNORMAL LOW (ref 22–32)
Calcium: 8.9 mg/dL (ref 8.9–10.3)
Chloride: 105 mmol/L (ref 98–111)
Creatinine, Ser: 1.35 mg/dL — ABNORMAL HIGH (ref 0.44–1.00)
GFR, Estimated: 45 mL/min — ABNORMAL LOW (ref >60)
Glucose, Bld: 266 mg/dL — ABNORMAL HIGH (ref 70–99)
Potassium: 3.9 mmol/L (ref 3.5–5.1)
Sodium: 136 mmol/L (ref 135–145)
Total Bilirubin: 0.6 mg/dL (ref 0.3–1.2)
Total Protein: 7.7 g/dL (ref 6.5–8.1)

## 2023-01-31 LAB — CBG MONITORING, ED: Glucose-Capillary: 224 mg/dL — ABNORMAL HIGH (ref 70–99)

## 2023-01-31 LAB — LACTIC ACID, PLASMA
Lactic Acid, Venous: 3.5 mmol/L (ref 0.5–1.9)
Lactic Acid, Venous: 1.5 mmol/L (ref 0.5–1.9)

## 2023-01-31 LAB — SEDIMENTATION RATE: Sed Rate: 64 mm/h — ABNORMAL HIGH (ref 0–30)

## 2023-01-31 MED ORDER — HYDROXYCHLOROQUINE SULFATE 200 MG PO TABS
200.0000 mg | ORAL_TABLET | Freq: Two times a day (BID) | ORAL | Status: DC
  Administered 2023-02-01 – 2023-02-04 (×8): 200 mg via ORAL
  Filled 2023-01-31 (×9): qty 1

## 2023-01-31 MED ORDER — SODIUM CHLORIDE 0.9 % IV SOLN
2.0000 g | Freq: Three times a day (TID) | INTRAVENOUS | Status: DC
  Filled 2023-01-31 (×2): qty 12.5

## 2023-01-31 MED ORDER — VANCOMYCIN HCL 1500 MG/300ML IV SOLN
1500.0000 mg | Freq: Once | INTRAVENOUS | Status: AC
  Administered 2023-01-31: 1500 mg via INTRAVENOUS
  Filled 2023-01-31: qty 300

## 2023-01-31 MED ORDER — SODIUM CHLORIDE 0.9 % IV BOLUS
500.0000 mL | Freq: Once | INTRAVENOUS | Status: AC
  Administered 2023-01-31: 500 mL via INTRAVENOUS

## 2023-01-31 MED ORDER — SODIUM CHLORIDE 0.9 % IV BOLUS
1000.0000 mL | Freq: Once | INTRAVENOUS | Status: AC
  Administered 2023-01-31: 1000 mL via INTRAVENOUS

## 2023-01-31 MED ORDER — ONDANSETRON HCL 4 MG/2ML IJ SOLN
4.0000 mg | Freq: Once | INTRAMUSCULAR | Status: AC
  Administered 2023-01-31: 4 mg via INTRAVENOUS

## 2023-01-31 MED ORDER — FLUOXETINE HCL 20 MG PO CAPS
40.0000 mg | ORAL_CAPSULE | Freq: Every day | ORAL | Status: DC
  Administered 2023-02-01 – 2023-02-04 (×4): 40 mg via ORAL
  Filled 2023-01-31 (×4): qty 2

## 2023-01-31 MED ORDER — IBUPROFEN 400 MG PO TABS
400.0000 mg | ORAL_TABLET | Freq: Once | ORAL | Status: AC
  Administered 2023-01-31: 400 mg via ORAL
  Filled 2023-01-31: qty 1

## 2023-01-31 MED ORDER — LEVOTHYROXINE SODIUM 88 MCG PO TABS
88.0000 ug | ORAL_TABLET | Freq: Every day | ORAL | Status: DC
  Administered 2023-02-01 – 2023-02-04 (×4): 88 ug via ORAL
  Filled 2023-01-31 (×4): qty 1

## 2023-01-31 MED ORDER — ACETAMINOPHEN 325 MG PO TABS: 650.0000 mg | ORAL_TABLET | Freq: Four times a day (QID) | ORAL | Status: DC | PRN

## 2023-01-31 MED ORDER — LACTATED RINGERS IV SOLN: INTRAVENOUS | Status: DC

## 2023-01-31 MED ORDER — ARIPIPRAZOLE 10 MG PO TABS
5.0000 mg | ORAL_TABLET | Freq: Every day | ORAL | Status: DC
  Administered 2023-02-01 – 2023-02-04 (×4): 5 mg via ORAL
  Filled 2023-01-31 (×4): qty 1

## 2023-01-31 MED ORDER — IOHEXOL 300 MG/ML  SOLN
100.0000 mL | Freq: Once | INTRAMUSCULAR | Status: AC | PRN
  Administered 2023-01-31: 100 mL via INTRAVENOUS

## 2023-01-31 MED ORDER — SODIUM CHLORIDE 0.9 % IV SOLN
2.0000 g | Freq: Once | INTRAVENOUS | Status: AC
  Administered 2023-01-31: 2 g via INTRAVENOUS
  Filled 2023-01-31: qty 12.5

## 2023-01-31 MED ORDER — VANCOMYCIN HCL IN DEXTROSE 1-5 GM/200ML-% IV SOLN
1000.0000 mg | Freq: Once | INTRAVENOUS | Status: AC
  Administered 2023-01-31: 1000 mg via INTRAVENOUS
  Filled 2023-01-31: qty 200

## 2023-01-31 MED ORDER — SODIUM CHLORIDE 0.9 % IV SOLN
750.0000 mg | Freq: Every day | INTRAVENOUS | Status: DC
  Administered 2023-02-01: 800 mg via INTRAVENOUS
  Filled 2023-01-31: qty 16

## 2023-01-31 MED ORDER — BUSPIRONE HCL 5 MG PO TABS
10.0000 mg | ORAL_TABLET | Freq: Two times a day (BID) | ORAL | Status: DC
  Administered 2023-02-01 – 2023-02-04 (×8): 10 mg via ORAL
  Filled 2023-01-31 (×8): qty 2

## 2023-01-31 MED ORDER — QUETIAPINE FUMARATE 300 MG PO TABS
300.0000 mg | ORAL_TABLET | Freq: Every day | ORAL | Status: DC
  Administered 2023-02-01 – 2023-02-03 (×4): 300 mg via ORAL
  Filled 2023-01-31 (×4): qty 1

## 2023-01-31 MED ORDER — SODIUM CHLORIDE 0.9% FLUSH
3.0000 mL | Freq: Two times a day (BID) | INTRAVENOUS | Status: DC
  Administered 2023-01-31 – 2023-02-04 (×8): 3 mL via INTRAVENOUS

## 2023-01-31 MED ORDER — APIXABAN 5 MG PO TABS
5.0000 mg | ORAL_TABLET | Freq: Two times a day (BID) | ORAL | Status: DC
  Administered 2023-02-01 – 2023-02-04 (×8): 5 mg via ORAL
  Filled 2023-01-31 (×8): qty 1

## 2023-01-31 MED ORDER — ACETAMINOPHEN 650 MG RE SUPP: 650.0000 mg | Freq: Four times a day (QID) | RECTAL | Status: DC | PRN

## 2023-01-31 MED ORDER — ONDANSETRON HCL 4 MG/2ML IJ SOLN
INTRAMUSCULAR | Status: AC
  Filled 2023-01-31: qty 2

## 2023-01-31 NOTE — Consult Note (Incomplete)
CODE SEPSIS - PHARMACY COMMUNICATION  **Broad-spectrum antimicrobials should be administered within one hour of sepsis diagnosis**  Time Code Sepsis call or page was received: 1512  Antibiotics ordered: ***  Time of first antibiotic administration: ***  Additional action taken by pharmacy: ***  If necessary, name of provider/nurse contacted: ***    Will M. Dareen Piano, PharmD Clinical Pharmacist 01/31/2023 3:17 PM

## 2023-01-31 NOTE — H&P (Signed)
History and Physical    Patient: [name] [name]:[id number] DOB: [birth year] DOA: 01/31/2023 DOS: the patient was seen and examined on 01/31/2023 PCP: Etheleen Nicks, NP  Patient coming from: {Point_of_Origin:[address]}   Chief Complaint:  Chief Complaint  Patient presents with   Foot Pain   Fever    HPI: [name] [name] is a 62 y.o. female with medical history significant for ***   Chart review shows***  In ED***  Labs show***  Review of Systems: ROS  Past Medical History:  Diagnosis Date   Abdominal pain 02/12/2019   Abdominal wall hernia 01/29/2013   Acute metabolic encephalopathy 07/08/2019   AMS (altered mental status) 12/28/2021   Anxiety    Arthritis    Rheumatoid   C. difficile colitis    Chronic diastolic heart failure (HCC)    COVID-19 03/23/2019   Diagnosed at [facility] (send-out) on 03/23/2019   Depression    Diabetes mellitus    states no meds or diet restrictions  at present   Diastolic CHF (HCC)    Esophagitis    Fluid retention    GERD (gastroesophageal reflux disease)    Hiatal hernia    Hypertension    Hypokalemia due to loss of potassium 10/21/2015   Overview:  Associated with 3 weeks of diarrhea  And QT prolongation.   Hypothyroidism    IBS (irritable bowel syndrome)    Moderate episode of recurrent major depressive disorder (HCC) 06/03/2004   Morbid obesity (HCC)    MRSA (methicillin resistant Staphylococcus aureus) infection 11/2017   left inner thigh abcess   Neurogenic bladder    has pacemaker   Neuropathy    Obesity    Panic attacks    Pneumonia due to COVID-19 virus    Rheumatoid arthritis (HCC)    Sleep apnea    STATES SEVERE, CANT TOLERATE MASK- LAST STUDY YEARS AGO   Past Surgical History:  Procedure Laterality Date   ABDOMINAL HYSTERECTOMY     CHOLECYSTECTOMY     DG GREAT TOE RIGHT FOOT  02/23/2018   EYE SURGERY     bilateral cataract extraction with IOL   FOOT SURGERY Left 05/13/2021   [facility]   HERNIA REPAIR     ventral  hernia with strangulation   IRRIGATION AND DEBRIDEMENT FOOT Left 01/11/2020   Procedure: IRRIGATION AND DEBRIDEMENT FOOT;  Surgeon: Gwyneth Revels, DPM;  Location: [facility] ORS;  Service: Podiatry;  Laterality: Left;   LAPAROSCOPIC GASTRIC BANDING  03/20/2007   TEE WITHOUT CARDIOVERSION N/A 07/16/2019   Procedure: TRANSESOPHAGEAL ECHOCARDIOGRAM (TEE);  Surgeon: Debbe Odea, MD;  Location: [facility] ORS;  Service: Cardiovascular;  Laterality: N/A;   TONSILLECTOMY     TUBAL LIGATION     Social History:   reports that she quit smoking about 23 years ago. Her smoking use included cigarettes. She started smoking about 50 years ago. She has a 54 pack-year smoking history. She has never used smokeless tobacco. She reports that she does not drink alcohol and does not use drugs.  Allergies  Allergen Reactions   Cephalexin Hives   Codeine Palpitations, Nausea Only, Nausea And Vomiting, Rash and Shortness Of Breath    "makes heart fly, she gets flushed and passes out"   Doxycycline Rash   Propoxyphene Rash and Shortness Of Breath    Increase heart rate   Sulfa Antibiotics Palpitations, Nausea Only, Shortness Of Breath and Hives    "makes heart fly, she gets flushed and passes out"   Clindamycin Rash    Tongue  swelling, oral sores, and Mouth rash   Lovenox [Enoxaparin Sodium] Hives   Hydrocodone Nausea And Vomiting    Hear racing & breaks out into a cold sweat.   Meropenem Rash    Erythematous, hot, pruritic rash over arms, chest, back, abdomen, and face occurred at the end of meropenem infusion on 02/22/18    Family History  Problem Relation Age of Onset   Heart failure Father    Bipolar disorder Father    Alcohol abuse Father    Anxiety disorder Father    Depression Father    Heart disease Brother    Heart attack Brother 72       MI s/p stents placed   Anxiety disorder Sister    Depression Sister    Anxiety disorder Sister    Depression Sister    Bipolar disorder Sister    Alcohol  abuse Sister    Drug abuse Sister    Heart attack Brother     Prior to Admission medications   Medication Sig Start Date End Date Taking? Authorizing Provider  acetaminophen (TYLENOL) 325 MG tablet Take 2 tablets (650 mg total) by mouth every 6 (six) hours as needed for mild pain or moderate pain (or Fever >/= 101). 01/15/20   Marguerita Merles Latif, DO  amLODipine (NORVASC) 10 MG tablet Take 10 mg by mouth daily. 12/10/21   [provider]  ARIPiprazole (ABILIFY) 5 MG tablet Take 1 tablet (5 mg total) by mouth daily. 08/03/22   Clapacs, Jackquline Denmark, MD  ascorbic acid (VITAMIN C) 500 MG tablet Take 1 tablet (500 mg total) by mouth daily. 05/05/19   Rolly Salter, MD  atorvastatin (LIPITOR) 80 MG tablet Take 80 mg by mouth daily. 12/10/21   [provider]  Budeson-Glycopyrrol-Formoterol (BREZTRI AEROSPHERE) 160-9-4.8 MCG/ACT AERO Inhale 2 puffs into the lungs in the morning and at bedtime. 10/01/21   Salena Saner, MD  busPIRone (BUSPAR) 10 MG tablet Take 1 tablet (10 mg total) by mouth 2 (two) times daily. 08/03/22   Clapacs, Jackquline Denmark, MD  Calcium Citrate-Vitamin D3 (GNP CALCIUM CITRATE+D MAXIMUM) 315-6.25 MG-MCG TABS Take 1 tablet by mouth daily. 12/10/21   [provider]  Cholecalciferol (VITAMIN D) 125 MCG (5000 UT) CAPS Take 1 capsule by mouth daily. 11/29/19   [provider]  ELIQUIS 5 MG TABS tablet TAKE 1 TABLET BY MOUTH TWICE DAILY Patient taking differently: Take 5 mg by mouth 2 (two) times daily. 06/26/21   Salena Saner, MD  famotidine (PEPCID) 20 MG tablet Take 1 tablet (20 mg total) by mouth daily. 05/05/19   Rolly Salter, MD  FLUoxetine (PROZAC) 40 MG capsule Take 1 capsule (40 mg total) by mouth daily. 08/03/22 01/30/23  Clapacs, Jackquline Denmark, MD  folic acid (FOLVITE) 1 MG tablet Take 1 mg by mouth daily.     [provider]  hydroxychloroquine (PLAQUENIL) 200 MG tablet Take 200 mg by mouth 2 (two) times daily. 12/10/21   [provider]   levothyroxine (SYNTHROID, LEVOTHROID) 88 MCG tablet Take 88 mcg by mouth daily before breakfast.    [provider]  metFORMIN (GLUCOPHAGE-XR) 500 MG 24 hr tablet Take 500 mg by mouth daily. 12/31/19   [provider]  methotrexate (RHEUMATREX) 2.5 MG tablet Take 2.5 mg by mouth once a week.    [provider]  metoCLOPramide (REGLAN) 5 MG/5ML solution Take 5 mg by mouth 4 (four) times daily -  before meals and at bedtime. 12/20/21  [provider]  metoprolol tartrate (LOPRESSOR) 25 MG tablet Take 25 mg by mouth 2 (two) times daily. 10/10/20   [provider]  pramipexole (MIRAPEX) 0.5 MG tablet Take 0.5 mg by mouth at bedtime. 03/23/22   [provider]  pregabalin (LYRICA) 100 MG capsule Take 1 capsule (100 mg total) by mouth 3 (three) times daily. 05/20/22   Edward Jolly, MD  promethazine (PHENERGAN) 12.5 MG tablet Take 12.5 mg by mouth. 02/26/22   [provider]  QUEtiapine (SEROQUEL) 300 MG tablet TAKE ONE TABLET BY MOUTH AT BEDTIME. 08/03/22   Clapacs, Jackquline Denmark, MD  TRADJENTA 5 MG TABS tablet Take 5 mg by mouth daily. 12/31/19   [provider]  zinc sulfate 220 (50 Zn) MG capsule Take 220 mg by mouth at bedtime.  12/31/19   [provider]  zolpidem (AMBIEN) 5 MG tablet Take 1 tablet (5 mg total) by mouth at bedtime. 08/03/22   Clapacs, John T, MD     Vitals:   01/31/23 1720 01/31/23 1720 01/31/23 1730 01/31/23 1800  BP: (!) 110/55  (!) 107/53 (!) 110/58  Pulse: 74  73 71  Resp: (!) 24  (!) 26 (!) 21  Temp:  98.9 F (37.2 C)    TempSrc:  Oral    SpO2: 96%  96% 97%  Weight:      Height:       Physical Exam   Labs on Admission: I have personally reviewed following labs and imaging studies  CBC: Recent Labs  Lab 01/31/23 1342  WBC 9.9  NEUTROABS 9.3*  HGB 10.3*  HCT 35.1*  MCV 75.5*  PLT 285   Basic Metabolic Panel: Recent Labs  Lab 01/31/23 1342  NA 136  K 3.9  CL 105  CO2 19*  GLUCOSE  266*  BUN 16  CREATININE 1.35*  CALCIUM 8.9   GFR: Estimated Creatinine Clearance: 68.8 mL/min (A) (by C-G formula based on SCr of 1.35 mg/dL (H)). Liver Function Tests: Recent Labs  Lab 01/31/23 1342  AST 23  ALT 16  ALKPHOS 72  BILITOT 0.6  PROT 7.7  ALBUMIN 3.4*   No results for input(s): "LIPASE", "AMYLASE" in the last 168 hours. No results for input(s): "AMMONIA" in the last 168 hours. Coagulation Profile: Recent Labs  Lab 01/31/23 1342  INR 1.6*    CBG: Recent Labs  Lab 01/31/23 1332  GLUCAP 224*    Urinalysis    Component Value Date/Time   COLORURINE AMBER (A) 01/31/2023 1523   APPEARANCEUR CLOUDY (A) 01/31/2023 1523   APPEARANCEUR Clear 11/30/2013 1010   LABSPEC 1.024 01/31/2023 1523   LABSPEC 1.015 11/30/2013 1010   PHURINE 5.0 01/31/2023 1523   GLUCOSEU NEGATIVE 01/31/2023 1523   GLUCOSEU >=500 11/30/2013 1010   HGBUR NEGATIVE 01/31/2023 1523   BILIRUBINUR NEGATIVE 01/31/2023 1523   BILIRUBINUR Negative 11/30/2013 1010   KETONESUR 5 (A) 01/31/2023 1523   PROTEINUR 100 (A) 01/31/2023 1523   UROBILINOGEN 0.2 03/14/2007 1000   NITRITE NEGATIVE 01/31/2023 1523   LEUKOCYTESUR LARGE (A) 01/31/2023 1523   LEUKOCYTESUR Negative 11/30/2013 1010   Results for orders placed or performed during the hospital encounter of 01/31/23 (from the past 24 hour(s))  CBG monitoring, ED     Status: Abnormal   Collection Time: 01/31/23  1:32 PM  Result Value Ref Range   Glucose-Capillary 224 (H) 70 - 99 mg/dL  Comprehensive metabolic panel     Status: Abnormal   Collection Time: 01/31/23  1:42 PM  Result Value Ref Range   Sodium 136 135 - 145 mmol/L   Potassium 3.9 3.5 - 5.1 mmol/L   Chloride 105 98 - 111 mmol/L   CO2 19 (L) 22 - 32 mmol/L   Glucose, Bld 266 (H) 70 - 99 mg/dL   BUN 16 8 - 23 mg/dL   Creatinine, Ser 8.29 (H) 0.44 - 1.00 mg/dL   Calcium 8.9 8.9 - 56.2 mg/dL   Total Protein 7.7 6.5 - 8.1 g/dL   Albumin 3.4 (L) 3.5 - 5.0 g/dL   AST 23 15 - 41 U/L    ALT 16 0 - 44 U/L   Alkaline Phosphatase 72 38 - 126 U/L   Total Bilirubin 0.6 0.3 - 1.2 mg/dL   GFR, Estimated 45 (L) >60 mL/min   Anion gap 12 5 - 15  Lactic acid, plasma     Status: Abnormal   Collection Time: 01/31/23  1:42 PM  Result Value Ref Range   Lactic Acid, Venous 3.5 (HH) 0.5 - 1.9 mmol/L  CBC with Differential     Status: Abnormal   Collection Time: 01/31/23  1:42 PM  Result Value Ref Range   WBC 9.9 4.0 - 10.5 K/uL   RBC 4.65 3.87 - 5.11 MIL/uL   Hemoglobin 10.3 (L) 12.0 - 15.0 g/dL   HCT 13.0 (L) 86.5 - 78.4 %   MCV 75.5 (L) 80.0 - 100.0 fL   MCH 22.2 (L) 26.0 - 34.0 pg   MCHC 29.3 (L) 30.0 - 36.0 g/dL   RDW 69.6 (H) 29.5 - 28.4 %   Platelets 285 150 - 400 K/uL   nRBC 0.0 0.0 - 0.2 %   Neutrophils Relative % 94 %   Neutro Abs 9.3 (H) 1.7 - 7.7 K/uL   Lymphocytes Relative 3 %   Lymphs Abs 0.3 (L) 0.7 - 4.0 K/uL   Monocytes Relative 2 %   Monocytes Absolute 0.2 0.1 - 1.0 K/uL   Eosinophils Relative 0 %   Eosinophils Absolute 0.0 0.0 - 0.5 K/uL   Basophils Relative 0 %   Basophils Absolute 0.0 0.0 - 0.1 K/uL   Immature Granulocytes 1 %   Abs Immature Granulocytes 0.06 0.00 - 0.07 K/uL  Protime-INR     Status: Abnormal   Collection Time: 01/31/23  1:42 PM  Result Value Ref Range   Prothrombin Time 19.3 (H) 11.4 - 15.2 seconds   INR 1.6 (H) 0.8 - 1.2  Sedimentation rate     Status: Abnormal   Collection Time: 01/31/23  1:42 PM  Result Value Ref Range   Sed Rate 64 (H) 0 - 30 mm/hr  Procalcitonin     Status: None   Collection Time: 01/31/23  1:42 PM  Result Value Ref Range   Procalcitonin 0.53 ng/mL  Urinalysis, Routine w reflex microscopic -Urine, Clean Catch     Status: Abnormal   Collection Time: 01/31/23  3:23 PM  Result Value Ref Range   Color, Urine AMBER (A) YELLOW   APPearance CLOUDY (A) CLEAR   Specific Gravity, Urine 1.024 1.005 - 1.030   pH 5.0 5.0 - 8.0   Glucose, UA NEGATIVE NEGATIVE mg/dL   Hgb urine dipstick NEGATIVE NEGATIVE    Bilirubin Urine NEGATIVE NEGATIVE   Ketones, ur 5 (A) NEGATIVE mg/dL   Protein, ur 132 (A) NEGATIVE mg/dL   Nitrite NEGATIVE NEGATIVE   Leukocytes,Ua LARGE (A) NEGATIVE   RBC / HPF 6-10 0 - 5 RBC/hpf   WBC, UA >50 0 - 5 WBC/hpf  Bacteria, UA FEW (A) NONE SEEN   Squamous Epithelial / HPF 6-10 0 - 5 /HPF   WBC Clumps PRESENT    Mucus PRESENT    Hyaline Casts, UA PRESENT    Non Squamous Epithelial PRESENT (A) NONE SEEN  SARS Coronavirus 2 by RT PCR (hospital order, performed in [facility] hospital lab) *cepheid single result test* Anterior Nasal Swab     Status: None   Collection Time: 01/31/23  3:35 PM   Specimen: Anterior Nasal Swab  Result Value Ref Range   SARS Coronavirus 2 by RT PCR NEGATIVE NEGATIVE  Lactic acid, plasma     Status: None   Collection Time: 01/31/23  4:35 PM  Result Value Ref Range   Lactic Acid, Venous 1.5 0.5 - 1.9 mmol/L       Unresulted Labs (From admission, onward)     Start     Ordered   01/31/23 1528  C-reactive protein  Once,   R        01/31/23 1528   01/31/23 1522  C Difficile Quick Screen w PCR reflex  (C Difficile quick screen w PCR reflex panel )  Once, for 24 hours,   URGENT       References:    CDiff Information Tool   01/31/23 1521   01/31/23 1332  Culture, blood (Routine x 2)  BLOOD CULTURE X 2,   STAT      01/31/23 1331            Medications  vancomycin (VANCOCIN) IVPB 1000 mg/200 mL premix (0 mg Intravenous Stopped 01/31/23 1846)    And  vancomycin (VANCOREADY) IVPB 1500 mg/300 mL (1,500 mg Intravenous New Bag/Given 01/31/23 1736)  ARIPiprazole (ABILIFY) tablet 5 mg (has no administration in time range)  busPIRone (BUSPAR) tablet 10 mg (has no administration in time range)  apixaban (ELIQUIS) tablet 5 mg (has no administration in time range)  FLUoxetine (PROZAC) capsule 40 mg (has no administration in time range)  hydroxychloroquine (PLAQUENIL) tablet 200 mg (has no administration in time range)  levothyroxine (SYNTHROID)  tablet 88 mcg (has no administration in time range)  QUEtiapine (SEROQUEL) tablet 300 mg (has no administration in time range)  ondansetron (ZOFRAN) injection 4 mg (4 mg Intravenous Given 01/31/23 1346)  sodium chloride 0.9 % bolus 1,000 mL (0 mLs Intravenous Stopped 01/31/23 1646)  sodium chloride 0.9 % bolus 1,000 mL (0 mLs Intravenous Stopped 01/31/23 1646)  sodium chloride 0.9 % bolus 500 mL (0 mLs Intravenous Stopped 01/31/23 1759)  ibuprofen (ADVIL) tablet 400 mg (400 mg Oral Given 01/31/23 1534)  ceFEPIme (MAXIPIME) 2 g in sodium chloride 0.9 % 100 mL IVPB (0 g Intravenous Stopped 01/31/23 1709)  iohexol (OMNIPAQUE) 300 MG/ML solution 100 mL (100 mLs Intravenous Contrast Given 01/31/23 1705)    Radiological Exams on Admission: DG Chest Portable 1 View  Result Date: 01/31/2023 CLINICAL DATA:  Sepsis EXAM: PORTABLE CHEST 1 VIEW COMPARISON:  Chest x-ray February 27, 2022. FINDINGS: Enlarged cardiac silhouette. Pulmonary vascular congestion. Streaky right perihilar opacities. No visible pleural effusions or pneumothorax. No acute bony abnormality. IMPRESSION: 1. Streaky mild right perihilar opacities which could represent atelectasis or early pneumonia. 2. Cardiomegaly and pulmonary vascular congestion without overt pulmonary edema. Electronically Signed   By: Feliberto Harts M.D.   On: 01/31/2023 18:01   DG Foot Complete Left  Result Date: 01/31/2023 CLINICAL DATA:  Left foot pain for 2 days. Known infection left fourth toe. EXAM: LEFT FOOT - COMPLETE 3+ VIEW COMPARISON:  02/28/2022 FINDINGS: Presumed surgical absence of the distal first digit. Small Achilles and calcaneal spurs. Again identified is a radiopaque foreign object projecting within the plantar forefoot at the level of the third metatarsal neck. Maximally 8 mm. No osseous destruction.Forefoot soft tissue swelling is most apparent dorsally. IMPRESSION: No plain film evidence of osteomyelitis. Radiopaque foreign object about the  plantar forefoot, as before. Forefoot soft tissue swelling likely represent cellulitis. Electronically Signed   By: Jeronimo Greaves M.D.   On: 01/31/2023 16:35     Data Reviewed: Relevant notes from primary care and specialist visits, past discharge summaries as available in EHR, including Care Everywhere. Prior diagnostic testing as pertinent to current admission diagnoses Updated medications and problem lists for reconciliation ED course, including vitals, labs, imaging, treatment and response to treatment Triage notes, nursing and pharmacy notes and ED provider's notes Notable results as noted in HPI  Assessment and Plan: No notes have been filed under this hospital service. Service: Hospitalist  Assessment & Plan Fever and chills  Nausea & vomiting  Abdominal pain  Severe sepsis (HCC)  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[employer])  Acute UTI  Pulmonary fibrosis (HCC)  Chronic respiratory failure with hypoxia (HCC)  Diabetic ulcer of left foot (HCC)  Acute kidney injury superimposed on stage 4 chronic kidney disease (HCC)  Chronic diastolic CHF (congestive heart failure) (HCC)  COPD (chronic obstructive pulmonary disease) (HCC)  Essential (primary) hypertension  PAF (paroxysmal atrial fibrillation) (HCC)  Diabetic peripheral neuropathy (HCC)  Gastroparesis  Depression with anxiety  Hypothyroidism  Rheumatoid arthritis involving multiple sites with positive rheumatoid factor (HCC)  Morbid obesity with BMI of 50.0-59.9, adult (HCC)  Allergy to multiple antibiotics  MRSA infection  Electrolyte abnormality  HLD (hyperlipidemia)  Anemia        Prognosis: ***  DVT prophylaxis:  ***  Consults:  ***  Advance Care Planning:    Code Status: Prior   Family Communication:  ***  Disposition Plan:  ***  Severity of Illness: {Observation/Inpatient:21159}  Author: Gertha Calkin, MD 01/31/2023 7:28 PM  For on call review  [email or web address].

## 2023-01-31 NOTE — Progress Notes (Signed)
Pharmacy Antibiotic Note  [name] [name] is a 62 y.o. female admitted on 01/31/2023 with sepsis, UTI, and wound infection .  Patient has a wound culture from 9/23 showing MRSA. At her last hospitalization with [facility], she was initially on CTX/vanc targeting MRSA and Proteus but the CTX was discontinued after developing mild angioedema felt secondary to drug allergy. ID was consulted, recommended treating as presumed osteo w/ 6 weeks of Daptomycin via home infusion. She was set up with OPAT and home infusion via PICC line prior to discharge. Her dose daptomycin dose PTA 750 mg q24h. Pharmacy has been consulted for daptomycin and cefepime dosing.  Plan: Will continue outpatient daptomycin 800 mg IV q24h (outpatient dose rounded to closest 100 mg) Start patient on cefepime 2 g IV q8h Pharmacy will continue to monitor and adjust dose as needed  Height: 5\' 7"  (170.2 cm) Weight: (!) 156.5 kg (345 lb) IBW/kg (Calculated) : 61.6  Temp (24hrs), Avg:99.7 F (37.6 C), Min:98.9 F (37.2 C), Max:101.4 F (38.6 C)  Recent Labs  Lab 01/31/23 1342 01/31/23 1635  WBC 9.9  --   CREATININE 1.35*  --   LATICACIDVEN 3.5* 1.5    Estimated Creatinine Clearance: 68.8 mL/min (A) (by C-G formula based on SCr of 1.35 mg/dL (H)).    Allergies  Allergen Reactions   Cephalexin Hives   Codeine Palpitations, Nausea Only, Nausea And Vomiting, Rash and Shortness Of Breath    "makes heart fly, she gets flushed and passes out"   Doxycycline Rash   Propoxyphene Rash and Shortness Of Breath    Increase heart rate   Sulfa Antibiotics Palpitations, Nausea Only, Shortness Of Breath and Hives    "makes heart fly, she gets flushed and passes out"   Clindamycin Rash    Tongue swelling, oral sores, and Mouth rash   Lovenox [Enoxaparin Sodium] Hives   Hydrocodone Nausea And Vomiting    Hear racing & breaks out into a cold sweat.   Meropenem Rash    Erythematous, hot, pruritic rash over arms, chest, back, abdomen, and  face occurred at the end of meropenem infusion on 02/22/18    Antimicrobials this admission: 10/14 vancomycin  x 1 10/14 cefepime >>  10/14 daptomycin>>  Dose adjustments this admission: None  Microbiology results: 10/14 BCx: IP   Thank you for allowing pharmacy to be a part of this patient's care.  Merryl Hacker, PharmD Clinical Pharmacist 01/31/2023 9:39 PM

## 2023-01-31 NOTE — Consult Note (Signed)
CODE SEPSIS - PHARMACY COMMUNICATION  **Broad-spectrum antimicrobials should be administered within one hour of sepsis diagnosis**  Time Code Sepsis call or page was received: 1512  Antibiotics ordered: Vancomycin and cefepime  Time of first antibiotic administration: 1639  Additional action taken by pharmacy: N/A  If necessary, name of provider/nurse contacted: N/A   Paulita Fujita, PharmD Clinical Pharmacist 01/31/2023 4:23 PM

## 2023-01-31 NOTE — Hospital Course (Addendum)
    Per edmd: 62 obese. Dmii, dfi chronically  , h/o RA. , iv daptomycin since past two weeks  Fever  NUASEA  VOMITING,CHILLS , BAD NEUROPATHY. 101.4   Cellulitis of fourth toe of left foot (POA: Yes) Active Problems: Type 2 diabetes mellitus with hyperglycemia (CMS-HCC) (POA: Yes) GERD (gastroesophageal reflux disease) (POA: Yes) Mixed anxiety depressive disorder (POA: Yes) Bipolar disorder, unspecified (CMS-HCC) (POA: Yes) Chronic diastolic CHF (congestive heart failure), NYHA class 3 (CMS-HCC) (POA: Yes) Chronic pain syndrome (POA: Yes) MRSA (methicillin resistant Staphylococcus aureus) (POA: Yes) Rheumatoid arthritis involving multiple sites with positive rheumatoid factor (CMS-HCC) (POA: Yes) Paroxysmal A-fib (CMS-HCC) (POA: Yes)  H/O C.DIFF  On O2 at home 3 L at baseline. ECHO: IMPRESSIONS 1. Left ventricular ejection fraction, by estimation, is 55 to 60% . The left ventricle has normal function. The left ventricle has no regional wall motion abnormalities. Left ventricular diastolic parameters are consistent with Grade I diastolic dysfunction ( impaired relaxation) . The average left ventricular global longitudinal strain is - 20. 0 % . 2. Right ventricular systolic function is normal. The right ventricular size is normal. Tricuspid regurgitation signal is inadequate for assessing PA pressure. 3. Left atrial size was mild to moderately dilated. 4. The mitral valve is normal in structure. No evidence of mitral valve regurgitation. No evidence of mitral stenosis. 5. The aortic valve is normal in structure. Aortic valve regurgitation is not visualized. Aortic valve sclerosis is present, with no evidence of aortic valve stenosis. 6. The inferior vena cava is normal in size with greater than 50% respiratory variability, suggesting right atrial pressure of 3 mmHg.  UTI PNA C/H FOOT INFECTION. Consult consider: ID. PHArmacy.                CC: Fever / Confused per  daughter. Started Saturday night. Fever has been off an on taking tylenol.  Pt had nausea/ vomiting as well along with the fever also since yesterday. No goody or bc.  Pt has never had blood transfusion. Pt is on blood thinner.  Pt says she did not know she is anemic.  No nephrologist.  N/V since yesterday no particular food and has not be able to eat  because of vomiting since sat. GB was removed in the 90's because of nausea as well.  Has known hiatal hernia but no ulcers to her knowledge.  No other symptoms. Pt has had DFU about a month and has been on abx since 2 weeks.

## 2023-01-31 NOTE — ED Notes (Signed)
Pt transported to CT ?

## 2023-01-31 NOTE — Progress Notes (Signed)
PHARMACY -  BRIEF ANTIBIOTIC NOTE   Pharmacy has received consult(s) for vancomycin and cefepime from an ED provider.  The patient's profile has been reviewed for ht/wt/allergies/indication/available labs.    One time order(s) placed for Cefepime 2 g IV x 1 , Vancomycin 2.5 g IV x 1  Further antibiotics/pharmacy consults should be ordered by admitting physician if indicated.                       Thank you, Merryl Hacker, PharmD  Clinical Pharmacist 01/31/2023  4:34 PM

## 2023-01-31 NOTE — ED Triage Notes (Signed)
Pt in via [facility] from home, reports left foot pain x 2 days w/ N/V.  Patient arrives w/ PICC line, currently receiving Daptomycin daily for infection to fourth left toe.    Patient on 4L [place] at baseline.  CBG 265

## 2023-01-31 NOTE — Sepsis Progress Note (Addendum)
Code sepsis protocol being monitored by eLink. Pt on daptomycin at home prior to admission. Multiple allergies. No additional antibiotic ordered at this time. Lactic acid at 1342 = 3.5.  IVF bolus has been started.

## 2023-01-31 NOTE — ED Provider Notes (Signed)
[facility] Provider Note    Event Date/Time   First MD Initiated Contact with Patient 01/31/23 1504     (approximate)   History   Foot Pain and Fever   HPI  [name] [name] is a 62 y.o. female  with h/o depression, RA, obesity, hypothyroidism, diabetic neuropathy, PVD, chronic wound/infection of L fourth toe on IV Daptomycin via PICC line here with fever. Pt reports 2 days of fever, nausea, and poor appetite. She has felt fatigued, sleepy, and "awful." Denies any feeling in her foot so is unsure whether it is more painful or not. She does note persistent redness there though does state that it appears to be improving since starting dapto 2 weeks ago. She has had some diarrhea but feels it "isn't as bad" as C. Diff she has had before. No other complaints.       Physical Exam   Triage Vital Signs: ED Triage Vitals  Encounter Vitals Group     BP 01/31/23 1328 115/87     Systolic BP Percentile --      Diastolic BP Percentile --      Pulse Rate 01/31/23 1328 80     Resp 01/31/23 1328 (!) 24     Temp 01/31/23 1328 (!) 101.4 F (38.6 C)     Temp Source 01/31/23 1328 Oral     SpO2 01/31/23 1328 98 %     Weight 01/31/23 1329 (!) 345 lb (156.5 kg)     Height 01/31/23 1329 5\' 7"  (1.702 m)     Head Circumference --      Peak Flow --      Pain Score 01/31/23 1329 6     Pain Loc --      Pain Education --      Exclude from Growth Chart --     Most recent vital signs: Vitals:   01/31/23 1730 01/31/23 1800  BP: (!) 107/53 (!) 110/58  Pulse: 73 71  Resp: (!) 26 (!) 21  Temp:    SpO2: 96% 97%     General: Awake, no distress.  CV:  Good peripheral perfusion. RRR. No m/r/g. Resp:  Normal work of breathing. Lungs clear bilaterally. Abd:  No distention. No tenderness. No rebound or guarding. Other:  Left fourth toe with moderate erythema, warmth to distal phalanx, with dried, healing wound to distal aspect of digit. Toes o/w warm, well perfused. 1+ DP/PT  pulses b/l. 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   ED Results / Procedures / Treatments   Labs (all labs ordered are listed, but only abnormal results are displayed) Labs Reviewed  COMPREHENSIVE METABOLIC PANEL - Abnormal; Notable for the following components:      Result Value   CO2 19 (*)    Glucose, Bld 266 (*)    Creatinine, Ser 1.35 (*)    Albumin 3.4 (*)    GFR, Estimated 45 (*)    All other components within normal limits  LACTIC ACID, PLASMA - Abnormal; Notable for the following components:   Lactic Acid, Venous 3.5 (*)    All other components within normal limits  CBC WITH DIFFERENTIAL/PLATELET - Abnormal; Notable for the following components:   Hemoglobin 10.3 (*)    HCT 35.1 (*)    MCV 75.5 (*)    MCH 22.2 (*)    MCHC 29.3 (*)    RDW 19.1 (*)    Neutro Abs 9.3 (*)    Lymphs Abs 0.3 (*)    All  other components within normal limits  PROTIME-INR - Abnormal; Notable for the following components:   Prothrombin Time 19.3 (*)    INR 1.6 (*)    All other components within normal limits  SEDIMENTATION RATE - Abnormal; Notable for the following components:   Sed Rate 64 (*)    All other components within normal limits  URINALYSIS, ROUTINE W REFLEX MICROSCOPIC - Abnormal; Notable for the following components:   Color, Urine AMBER (*)    APPearance CLOUDY (*)    Ketones, ur 5 (*)    Protein, ur 100 (*)    Leukocytes,Ua LARGE (*)    Bacteria, UA FEW (*)    Non Squamous Epithelial PRESENT (*)    All other components within normal limits  CBG MONITORING, ED - Abnormal; Notable for the following components:   Glucose-Capillary 224 (*)    All other components within normal limits  SARS CORONAVIRUS 2 BY RT PCR  CULTURE, BLOOD (ROUTINE X 2)  CULTURE, BLOOD (ROUTINE X 2)  C DIFFICILE QUICK SCREEN W PCR REFLEX    LACTIC ACID, PLASMA  C-REACTIVE PROTEIN     EKG    RADIOLOGY DG Foot left: Negative for osteo CXR: Possible PNA   I also independently reviewed and agree  with radiologist interpretations.   PROCEDURES:  Critical Care performed: Yes, see critical care procedure note(s)  .Critical Care  Performed by: Shaune Pollack, MD Authorized by: Shaune Pollack, MD   Critical care provider statement:    Critical care time (minutes):  30   Critical care time was exclusive of:  Separately billable procedures and treating other patients   Critical care was necessary to treat or prevent imminent or life-threatening deterioration of the following conditions:  Cardiac failure, circulatory failure, respiratory failure and sepsis   Critical care was time spent personally by me on the following activities:  Development of treatment plan with patient or surrogate, discussions with consultants, evaluation of patient's response to treatment, examination of patient, ordering and review of laboratory studies, ordering and review of radiographic studies, ordering and performing treatments and interventions, pulse oximetry, re-evaluation of patient's condition and review of old charts   Care discussed with: admitting provider       MEDICATIONS ORDERED IN ED: Medications  vancomycin (VANCOCIN) IVPB 1000 mg/200 mL premix (1,000 mg Intravenous New Bag/Given 01/31/23 1740)    And  vancomycin (VANCOREADY) IVPB 1500 mg/300 mL (1,500 mg Intravenous New Bag/Given 01/31/23 1736)  ondansetron (ZOFRAN) injection 4 mg (4 mg Intravenous Given 01/31/23 1346)  sodium chloride 0.9 % bolus 1,000 mL (0 mLs Intravenous Stopped 01/31/23 1646)  sodium chloride 0.9 % bolus 1,000 mL (0 mLs Intravenous Stopped 01/31/23 1646)  sodium chloride 0.9 % bolus 500 mL (0 mLs Intravenous Stopped 01/31/23 1759)  ibuprofen (ADVIL) tablet 400 mg (400 mg Oral Given 01/31/23 1534)  ceFEPIme (MAXIPIME) 2 g in sodium chloride 0.9 % 100 mL IVPB (0 g Intravenous Stopped 01/31/23 1709)  iohexol (OMNIPAQUE) 300 MG/ML solution 100 mL (100 mLs Intravenous Contrast Given 01/31/23 1705)     IMPRESSION / MDM  / ASSESSMENT AND PLAN / ED COURSE  I reviewed the triage vital signs and the nursing notes.                              Differential diagnosis includes, but is not limited to, sepsis from toe osteomyelitis, deep tissue infection, RA, other infection (UTI, PNA), COVID-19  Patient's presentation is  most consistent with acute presentation with potential threat to life or bodily function.  The patient is on the cardiac monitor to evaluate for evidence of arrhythmia and/or significant heart rate changes   62 yo F with h/o obesity, DM, chronic infection of L fourth toe, here with fever and fatigue. On exam, pt's toe actually appears relatively mildly infected, and they feel it is improving. She does, however, have a cough, and CXR is c/f PNA. UA also shows possible UTI. Suspect sepsis from alternative source, unlikely worsening toe wound. No osteo on plain films. Pt broadened out on ABX and given 30 cc/kg of IBW fluid. LA was >3 but has improved with fluids. She clinically does not appear altered. Will admit to medicine.   FINAL CLINICAL IMPRESSION(S) / ED DIAGNOSES   Final diagnoses:  Sepsis without acute organ dysfunction, due to unspecified organism (HCC)  Lactic acidosis  Community acquired pneumonia, unspecified laterality     Rx / DC Orders   ED Discharge Orders     None        Note:  This document was prepared using Dragon voice recognition software and may include unintentional dictation errors.   Shaune Pollack, MD 01/31/23 [phone]

## 2023-01-31 NOTE — ED Notes (Signed)
Hospitalist at bedside 

## 2023-02-01 ENCOUNTER — Ambulatory Visit: Payer: Medicaid Other | Admitting: Psychiatry

## 2023-02-01 ENCOUNTER — Inpatient Hospital Stay: Payer: Medicaid Other

## 2023-02-01 DIAGNOSIS — K3184 Gastroparesis: Secondary | ICD-10-CM

## 2023-02-01 DIAGNOSIS — I5033 Acute on chronic diastolic (congestive) heart failure: Secondary | ICD-10-CM

## 2023-02-01 DIAGNOSIS — D649 Anemia, unspecified: Secondary | ICD-10-CM

## 2023-02-01 DIAGNOSIS — J189 Pneumonia, unspecified organism: Secondary | ICD-10-CM | POA: Diagnosis not present

## 2023-02-01 DIAGNOSIS — A419 Sepsis, unspecified organism: Secondary | ICD-10-CM

## 2023-02-01 DIAGNOSIS — N390 Urinary tract infection, site not specified: Secondary | ICD-10-CM

## 2023-02-01 DIAGNOSIS — J9601 Acute respiratory failure with hypoxia: Secondary | ICD-10-CM

## 2023-02-01 DIAGNOSIS — N1832 Chronic kidney disease, stage 3b: Secondary | ICD-10-CM

## 2023-02-01 DIAGNOSIS — I10 Essential (primary) hypertension: Secondary | ICD-10-CM

## 2023-02-01 DIAGNOSIS — E039 Hypothyroidism, unspecified: Secondary | ICD-10-CM

## 2023-02-01 DIAGNOSIS — J9611 Chronic respiratory failure with hypoxia: Secondary | ICD-10-CM

## 2023-02-01 DIAGNOSIS — E785 Hyperlipidemia, unspecified: Secondary | ICD-10-CM

## 2023-02-01 DIAGNOSIS — I480 Paroxysmal atrial fibrillation: Secondary | ICD-10-CM

## 2023-02-01 DIAGNOSIS — Z6841 Body Mass Index (BMI) 40.0 and over, adult: Secondary | ICD-10-CM

## 2023-02-01 DIAGNOSIS — R6520 Severe sepsis without septic shock: Secondary | ICD-10-CM

## 2023-02-01 DIAGNOSIS — J8410 Pulmonary fibrosis, unspecified: Secondary | ICD-10-CM

## 2023-02-01 LAB — CBC
HCT: 31.7 % — ABNORMAL LOW (ref 36.0–46.0)
Hemoglobin: 9.4 g/dL — ABNORMAL LOW (ref 12.0–15.0)
MCH: 22.5 pg — ABNORMAL LOW (ref 26.0–34.0)
MCHC: 29.7 g/dL — ABNORMAL LOW (ref 30.0–36.0)
MCV: 76 fL — ABNORMAL LOW (ref 80.0–100.0)
Platelets: 204 10*3/uL (ref 150–400)
RBC: 4.17 MIL/uL (ref 3.87–5.11)
RDW: 19.1 % — ABNORMAL HIGH (ref 11.5–15.5)
WBC: 7.7 10*3/uL (ref 4.0–10.5)
nRBC: 0 % (ref 0.0–0.2)

## 2023-02-01 LAB — COMPREHENSIVE METABOLIC PANEL
ALT: 14 U/L (ref 0–44)
AST: 16 U/L (ref 15–41)
Albumin: 3 g/dL — ABNORMAL LOW (ref 3.5–5.0)
Alkaline Phosphatase: 64 U/L (ref 38–126)
Anion gap: 10 (ref 5–15)
BUN: 19 mg/dL (ref 8–23)
CO2: 21 mmol/L — ABNORMAL LOW (ref 22–32)
Calcium: 7.9 mg/dL — ABNORMAL LOW (ref 8.9–10.3)
Chloride: 106 mmol/L (ref 98–111)
Creatinine, Ser: 1.54 mg/dL — ABNORMAL HIGH (ref 0.44–1.00)
GFR, Estimated: 38 mL/min — ABNORMAL LOW (ref >60)
Glucose, Bld: 179 mg/dL — ABNORMAL HIGH (ref 70–99)
Potassium: 3.8 mmol/L (ref 3.5–5.1)
Sodium: 137 mmol/L (ref 135–145)
Total Bilirubin: 0.7 mg/dL (ref 0.3–1.2)
Total Protein: 6.6 g/dL (ref 6.5–8.1)

## 2023-02-01 LAB — BLOOD GAS, VENOUS
Acid-base deficit: 5.4 mmol/L — ABNORMAL HIGH (ref 0.0–2.0)
Bicarbonate: 20.6 mmol/L (ref 20.0–28.0)
O2 Saturation: 73.3 %
Patient temperature: 37
pCO2, Ven: 41 mm[Hg] — ABNORMAL LOW (ref 44–60)
pH, Ven: 7.31 (ref 7.25–7.43)
pO2, Ven: 48 mm[Hg] — ABNORMAL HIGH (ref 32–45)

## 2023-02-01 LAB — BLOOD GAS, ARTERIAL
Acid-base deficit: 3.7 mmol/L — ABNORMAL HIGH (ref 0.0–2.0)
Bicarbonate: 22.1 mmol/L (ref 20.0–28.0)
O2 Saturation: 97.4 %
Patient temperature: 37
pCO2 arterial: 42 mm[Hg] (ref 32–48)
pH, Arterial: 7.33 — ABNORMAL LOW (ref 7.35–7.45)
pO2, Arterial: 80 mm[Hg] — ABNORMAL LOW (ref 83–108)

## 2023-02-01 LAB — URINE DRUG SCREEN, QUALITATIVE (ARMC ONLY)
Amphetamines, Ur Screen: NOT DETECTED
Barbiturates, Ur Screen: NOT DETECTED
Benzodiazepine, Ur Scrn: NOT DETECTED
Cannabinoid 50 Ng, Ur ~~LOC~~: NOT DETECTED
Cocaine Metabolite,Ur ~~LOC~~: NOT DETECTED
MDMA (Ecstasy)Ur Screen: NOT DETECTED
Methadone Scn, Ur: NOT DETECTED
Opiate, Ur Screen: NOT DETECTED
Phencyclidine (PCP) Ur S: NOT DETECTED
Tricyclic, Ur Screen: POSITIVE — AB

## 2023-02-01 LAB — HIV ANTIBODY (ROUTINE TESTING W REFLEX): HIV Screen 4th Generation wRfx: NONREACTIVE

## 2023-02-01 LAB — CK: Total CK: 184 U/L (ref 38–234)

## 2023-02-01 LAB — GLUCOSE, CAPILLARY: Glucose-Capillary: 144 mg/dL — ABNORMAL HIGH (ref 70–99)

## 2023-02-01 LAB — MRSA NEXT GEN BY PCR, NASAL: MRSA by PCR Next Gen: NOT DETECTED

## 2023-02-01 MED ORDER — FUROSEMIDE 10 MG/ML IJ SOLN
60.0000 mg | Freq: Once | INTRAMUSCULAR | Status: AC
  Administered 2023-02-01: 60 mg via INTRAVENOUS
  Filled 2023-02-01: qty 6

## 2023-02-01 MED ORDER — AZITHROMYCIN 250 MG PO TABS
500.0000 mg | ORAL_TABLET | Freq: Every day | ORAL | Status: AC
  Administered 2023-02-01: 500 mg via ORAL
  Filled 2023-02-01: qty 2

## 2023-02-01 MED ORDER — ORAL CARE MOUTH RINSE: 15.0000 mL | OROMUCOSAL | Status: DC | PRN

## 2023-02-01 MED ORDER — IPRATROPIUM-ALBUTEROL 0.5-2.5 (3) MG/3ML IN SOLN
3.0000 mL | RESPIRATORY_TRACT | Status: DC | PRN
  Administered 2023-02-01 (×2): 3 mL via RESPIRATORY_TRACT
  Filled 2023-02-01 (×2): qty 3

## 2023-02-01 MED ORDER — AZITHROMYCIN 250 MG PO TABS
250.0000 mg | ORAL_TABLET | Freq: Every day | ORAL | Status: DC
  Administered 2023-02-02 – 2023-02-03 (×2): 250 mg via ORAL
  Filled 2023-02-01 (×2): qty 1

## 2023-02-01 MED ORDER — SODIUM CHLORIDE 0.9 % IV SOLN
2.0000 g | Freq: Three times a day (TID) | INTRAVENOUS | Status: DC
  Administered 2023-02-01 – 2023-02-03 (×5): 2 g via INTRAVENOUS
  Filled 2023-02-01 (×8): qty 10

## 2023-02-01 MED ORDER — SODIUM CHLORIDE 0.9 % IV SOLN
1.0000 g | Freq: Three times a day (TID) | INTRAVENOUS | Status: DC
  Administered 2023-02-01: 1 g via INTRAVENOUS
  Filled 2023-02-01 (×2): qty 5

## 2023-02-01 MED ORDER — ONDANSETRON HCL 4 MG/2ML IJ SOLN
4.0000 mg | Freq: Four times a day (QID) | INTRAMUSCULAR | Status: DC | PRN
  Administered 2023-02-01 – 2023-02-03 (×3): 4 mg via INTRAVENOUS
  Filled 2023-02-01 (×3): qty 2

## 2023-02-01 MED ORDER — INFLUENZA VIRUS VACC SPLIT PF (FLUZONE) 0.5 ML IM SUSY: 0.5000 mL | PREFILLED_SYRINGE | INTRAMUSCULAR | Status: DC

## 2023-02-01 MED ORDER — BUDESONIDE 0.5 MG/2ML IN SUSP
0.5000 mg | Freq: Two times a day (BID) | RESPIRATORY_TRACT | Status: DC
  Administered 2023-02-01 – 2023-02-04 (×7): 0.5 mg via RESPIRATORY_TRACT
  Filled 2023-02-01 (×7): qty 2

## 2023-02-01 MED ORDER — CHLORHEXIDINE GLUCONATE CLOTH 2 % EX PADS
6.0000 | MEDICATED_PAD | Freq: Every day | CUTANEOUS | Status: DC
  Administered 2023-02-01 – 2023-02-03 (×3): 6 via TOPICAL

## 2023-02-01 MED ORDER — SODIUM CHLORIDE 0.9 % IV SOLN
12.5000 mg | Freq: Four times a day (QID) | INTRAVENOUS | Status: DC | PRN
  Administered 2023-02-01 – 2023-02-03 (×2): 12.5 mg via INTRAVENOUS
  Filled 2023-02-01 (×2): qty 12.5
  Filled 2023-02-01: qty 0.5

## 2023-02-01 MED ORDER — PANTOPRAZOLE SODIUM 40 MG IV SOLR
40.0000 mg | Freq: Two times a day (BID) | INTRAVENOUS | Status: DC
  Administered 2023-02-01 – 2023-02-04 (×7): 40 mg via INTRAVENOUS
  Filled 2023-02-01 (×8): qty 10

## 2023-02-01 NOTE — Assessment & Plan Note (Signed)
Continue levothyroxine 

## 2023-02-01 NOTE — Assessment & Plan Note (Addendum)
Present on admission with fever, tachypnea, positive urine analysis, multifocal pneumonia on chest x-ray, previous treatment of MRSA with daptomycin.  Continue daptomycin.  Added aztreonam.  Try to add on urine culture.

## 2023-02-01 NOTE — Assessment & Plan Note (Signed)
Chronically on 4 L

## 2023-02-01 NOTE — Assessment & Plan Note (Signed)
As needed DuoNebs, will start patient on scheduled Pulmicort.

## 2023-02-01 NOTE — Assessment & Plan Note (Signed)
Patient now on 8 L high flow nasal cannula with good saturations.  Chronically wears 4 L.  Did have a pulse ox overnight of 88% on 4 L.

## 2023-02-01 NOTE — Assessment & Plan Note (Signed)
Patient meeting severe sepsis criteria on arrival.  Cultures collected patient continued on daptomycin and cefepime.

## 2023-02-01 NOTE — Assessment & Plan Note (Signed)
Try to add on urine culture.  Aztreonam cover.

## 2023-02-01 NOTE — Assessment & Plan Note (Signed)
Stopped IV fluids this morning given 1 dose of Lasix because of worsening respiratory status overnight.

## 2023-02-01 NOTE — Assessment & Plan Note (Signed)
Continue patient on Eliquis.  EKG shows sinus rhythm at 68.

## 2023-02-01 NOTE — Assessment & Plan Note (Signed)
With chronic respiratory failure on 4 L chronically.

## 2023-02-01 NOTE — Assessment & Plan Note (Signed)
BMI 54.03

## 2023-02-01 NOTE — Plan of Care (Signed)
  Problem: Fluid Volume: Goal: Hemodynamic stability will improve Outcome: Progressing   Problem: Clinical Measurements: Goal: Diagnostic test results will improve Outcome: Progressing   Problem: Respiratory: Goal: Ability to maintain adequate ventilation will improve Outcome: Progressing   Problem: Health Behavior/Discharge Planning: Goal: Ability to manage health-related needs will improve Outcome: Progressing   Problem: Clinical Measurements: Goal: Ability to maintain clinical measurements within normal limits will improve Outcome: Progressing   Problem: Activity: Goal: Risk for activity intolerance will decrease Outcome: Progressing   Problem: Coping: Goal: Level of anxiety will decrease Outcome: Progressing

## 2023-02-01 NOTE — Assessment & Plan Note (Signed)
Hold blood pressure medication

## 2023-02-01 NOTE — Assessment & Plan Note (Signed)
Pharmacy has been consulted to monitor patient's antibiotic's.

## 2023-02-01 NOTE — Progress Notes (Signed)
Pharmacy Antibiotic Note  [name] [name] is a 62 y.o. female admitted on 01/31/2023 with sepsis, UTI, and wound infection .  Patient has a wound culture from 9/23 showing MRSA. At her last hospitalization with [facility], she was initially on CTX/vanc targeting MRSA and Proteus but the CTX was discontinued after developing mild angioedema felt secondary to drug allergy. ID was consulted, recommended treating as presumed osteo w/ 6 weeks of Daptomycin via home infusion. She was set up with OPAT and home infusion via PICC line prior to discharge. Her dose daptomycin dose PTA 750 mg q24h (end date 02/20/23). Pharmacy has been consulted for daptomycin and cefepime dosing.  Today, 02/01/2023 Dr Renae Gloss contacted by pharmacist regarding possibility of daptomycin-induced eosinophilic pneumonia.  Patient presents with fever, N/V, confusion and foot pain.  Chest Xray possible multifocal pneumonia.  She is currently requiring HFNC Typical signs/symptoms of daptomycin-induced eosinphilic pneumonia are fever, cough, shortness of breath, and pneumonia (multifocal) per imaging.  Diagnosis of exclusion but eosinphils on BAL would be expected finding Renal: SCr looks to be worsening.  Patient was on vancomycin during admission at [facility] - therapeutic trough (14.8 mcg/ml) on vancomycin 2gm IV q24h. SCr at that time was 1.20 WBC WNL Tmax was 101.4 Patient received total of vancomycin 2.5gm at 1740 10/14 Note patient with multiple allergies including concern for angioedema while on ceftriaxone at [facility] on 01/12/23  Plan: With increasing SCr, withhold further vancomycin doses and check random vancomycin level later tonight.  Re-dose as appropriate per level Check SCR with level Increase aztreonam to 2gm IV q8h for renal function and obesity Pharmacy will continue to monitor and adjust dose as needed  Height: 5\' 7"  (170.2 cm) Weight: (!) 156.5 kg (345 lb) IBW/kg (Calculated) : 61.6  Temp (24hrs), Avg:98.9 F (37.2 C),  Min:98.3 F (36.8 C), Max:99.8 F (37.7 C)  Recent Labs  Lab 01/31/23 1342 01/31/23 1635 02/01/23 0206  WBC 9.9  --  7.7  CREATININE 1.35*  --  1.54*  LATICACIDVEN 3.5* 1.5  --     Estimated Creatinine Clearance: 60.3 mL/min (A) (by C-G formula based on SCr of 1.54 mg/dL (H)).    Allergies  Allergen Reactions   Ceftriaxone Anaphylaxis    Angioedema while admitted at [facility] 01/12/2023   Cephalexin Hives   Codeine Palpitations, Nausea Only, Nausea And Vomiting, Rash and Shortness Of Breath    "makes heart fly, she gets flushed and passes out"   Doxycycline Rash   Propoxyphene Rash and Shortness Of Breath    Increase heart rate   Sulfa Antibiotics Palpitations, Nausea Only, Shortness Of Breath and Hives    "makes heart fly, she gets flushed and passes out"   Clindamycin Rash    Tongue swelling, oral sores, and Mouth rash   Lovenox [Enoxaparin Sodium] Hives   Hydrocodone Nausea And Vomiting    Hear racing & breaks out into a cold sweat.   Meropenem Rash    Erythematous, hot, pruritic rash over arms, chest, back, abdomen, and face occurred at the end of meropenem infusion on 02/22/18    Antimicrobials this admission: 10/14 vancomycin  x 1 10/14 cefepime >>  10/14 daptomycin>>10/15 10/14 vancomycin >>  Dose adjustments this admission: None  Microbiology results: 10/14 BCx: NGTD 10/15 ucx:    Thank you for allowing pharmacy to be a part of this patient's care.  Juliette Alcide, PharmD, BCPS, BCIDP Work Cell: [phone] 02/01/2023 4:10 PM

## 2023-02-01 NOTE — Assessment & Plan Note (Signed)
Abnormal u/a. Pt does not report any dysuria.  Continue with cefepime.

## 2023-02-01 NOTE — Assessment & Plan Note (Deleted)
Mild AKI on CKD. Monitor renal function, avoid contrast and renally dose medications and antibiotics.

## 2023-02-01 NOTE — Assessment & Plan Note (Signed)
Continue nebulizer treatments.

## 2023-02-01 NOTE — Assessment & Plan Note (Signed)
Attributed to patient's urinary tract infection. Patient is currently receiving treatment for diabetic foot infection but has already been on antibiotics for that.

## 2023-02-01 NOTE — Assessment & Plan Note (Signed)
Pharmacy consult

## 2023-02-01 NOTE — Assessment & Plan Note (Signed)
Will continue patient's statin therapy.

## 2023-02-01 NOTE — Assessment & Plan Note (Signed)
Continue patient Seroquel.

## 2023-02-01 NOTE — Assessment & Plan Note (Signed)
-  Hold IV fluids

## 2023-02-01 NOTE — Assessment & Plan Note (Signed)
Last hemoglobin 9.4 will check a ferritin tomorrow.

## 2023-02-01 NOTE — Evaluation (Signed)
Physical Therapy Evaluation Patient Details Name: [name] [name] MRN: [id number] DOB: May 12, 1960 Today's Date: 02/01/2023  History of Present Illness  Pt is a 62 y.o. female  with h/o depression, RA, obesity, hypothyroidism, diabetic neuropathy, PVD, chronic wound/infection of L fourth toe on IV Daptomycin via PICC line here with fever. Pt reports 2 days of fever, nausea, and poor appetite. She has felt fatigued, sleepy, and "awful." Denies any feeling in her foot so is unsure whether it is more painful or not. She does note persistent redness there though does state that it appears to be improving since starting dapto 2 weeks ago. She has had some diarrhea but feels it "isn't as bad" as C. Diff she has had before. No other complaints.  Clinical Impression  Pt is received in bed reporting she is "too tired for PT today" but with encouragement agreeable to PT session. At baseline, Pt reports using RW and furniture walking during household amb distances only, no recent use of manual or power w/c, 4L [place] at baseline, and requiring assist with bathing from daughter. Pt performs bed mobility mod I-unable to assess further mobility due to Pt refusal. Pt able to perform bed exercises to promote BLE mobility and strength with no reports of difficulty. Educated Pt on use of RW instead of furniture walking to reduce risk of falls-Pt verbalized understanding. Pt would benefit from skilled PT to address above deficits and promote optimal return to PLOF.      If plan is discharge home, recommend the following: A lot of help with walking and/or transfers;A lot of help with bathing/dressing/bathroom;Assist for transportation;Help with stairs or ramp for entrance   Can travel by private vehicle   No    Equipment Recommendations None recommended by PT  Recommendations for Other Services       Functional Status Assessment Patient has had a recent decline in their functional status and demonstrates the ability  to make significant improvements in function in a reasonable and predictable amount of time.     Precautions / Restrictions Precautions Precautions: Fall Restrictions Weight Bearing Restrictions: No      Mobility  Bed Mobility Overal bed mobility: Needs Assistance Bed Mobility: Rolling Rolling: Modified independent (Device/Increase time), Used rails         General bed mobility comments: limited bed mobility at this time due to increase tiredness but able to demonstrate performance mod I with use of rails    Transfers                   General transfer comment: Unable to assess at this time due to Pt reports of increased tiredness    Ambulation/Gait               General Gait Details: Unable to assess at this time due to Pt reports of increased tiredness  Stairs            Wheelchair Mobility     Tilt Bed    Modified Rankin (Stroke Patients Only)       Balance                                             Pertinent Vitals/Pain Pain Assessment Pain Assessment: No/denies pain    Home Living Family/patient expects to be discharged to:: Private residence Living Arrangements: Children Available Help at Discharge: Family;Available 24 hours/day  Type of Home: House Home Access: Ramped entrance       Home Layout: One level Home Equipment: Agricultural consultant (2 wheels);Rollator (4 wheels);BSC/3in1;Wheelchair - manual;Wheelchair - power Additional Comments: Pt reports not currently using manual or power wheelchair    Prior Function Prior Level of Function : Independent/Modified Independent;Driving             Mobility Comments: furniture walks or uses RW for household amb only ADLs Comments: occasional assist from daughter with bathing but otherwise independent with IADL/ADLs     Extremity/Trunk Assessment        Lower Extremity Assessment Lower Extremity Assessment: Generalized weakness       Communication    Communication Communication: No apparent difficulties Cueing Techniques: Verbal cues  Cognition Arousal: Lethargic Behavior During Therapy: Flat affect Overall Cognitive Status: Within Functional Limits for tasks assessed                                 General Comments: AO x4; Pleasant but reports min sleep and would like to sleep, with encouragement she is agreeable to PT session        General Comments General comments (skin integrity, edema, etc.): Unable to formally assess seated or standing balance at this time due to Pt refusing to participate in EOB or OOB mobility reporting she is "too tired" adding she did not sleep until 5 AM this morning.    Exercises Other Exercises Other Exercises: 2x10 ea: SLR, supine marches, ankle pumps, hip ABD/ADD Other Exercises: Educated Pt on role and benefits of PT and use of AD for increase safety instead of furniture walking   Assessment/Plan    PT Assessment Patient needs continued PT services  PT Problem List Decreased strength;Decreased mobility;Decreased range of motion;Decreased activity tolerance;Decreased balance;Decreased coordination;Decreased cognition;Pain       PT Treatment Interventions DME instruction;Gait training;Stair training;Functional mobility training;Therapeutic activities;Therapeutic exercise    PT Goals (Current goals can be found in the Care Plan section)  Acute Rehab PT Goals Patient Stated Goal: to get stronger PT Goal Formulation: With patient Time For Goal Achievement: 02/15/23 Potential to Achieve Goals: Fair    Frequency Min 1X/week     Co-evaluation               AM-PAC PT "6 Clicks" Mobility  Outcome Measure Help needed turning from your back to your side while in a flat bed without using bedrails?: A Lot Help needed moving from lying on your back to sitting on the side of a flat bed without using bedrails?: A Lot Help needed moving to and from a bed to a chair (including a  wheelchair)?: A Lot Help needed standing up from a chair using your arms (e.g., wheelchair or bedside chair)?: A Lot Help needed to walk in hospital room?: A Lot Help needed climbing 3-5 steps with a railing? : Total 6 Click Score: 11    End of Session Equipment Utilized During Treatment: Oxygen Activity Tolerance: Patient limited by fatigue Patient left: in bed;with call bell/phone within reach;with bed alarm set Nurse Communication: Mobility status PT Visit Diagnosis: Unsteadiness on feet (R26.81);Muscle weakness (generalized) (M62.81);Pain Pain - Right/Left: Left Pain - part of body: Ankle and joints of foot    Time: 5621-3086 PT Time Calculation (min) (ACUTE ONLY): 13 min   Charges:                 Elmon Else, SPT  Mackinley Cassaday 02/01/2023, 1:52 PM

## 2023-02-01 NOTE — Assessment & Plan Note (Signed)
Continue patient on Eliquis.

## 2023-02-01 NOTE — Assessment & Plan Note (Addendum)
Improved.  CT scan negative

## 2023-02-01 NOTE — Assessment & Plan Note (Signed)
Continue Seroquel.

## 2023-02-01 NOTE — Assessment & Plan Note (Signed)
Anemia is chronic currently stable will type and screen, IV PPI.

## 2023-02-01 NOTE — Progress Notes (Signed)
On call contacted overnight re: SOB & difficulty breathing. See eMAR for new PRN's. Now on HFNC 8L w/ adequate o2 sats. On call also updated re: pt declined cefepime admin d/t stated allergy.

## 2023-02-01 NOTE — Assessment & Plan Note (Signed)
Continue daptomycin

## 2023-02-01 NOTE — Assessment & Plan Note (Signed)
Continue patient's levothyroxine.

## 2023-02-01 NOTE — Assessment & Plan Note (Signed)
Patient already on daptomycin.  Aztreonam added.  Will add Zithromax to cover atypicals.

## 2023-02-01 NOTE — Assessment & Plan Note (Signed)
Continue to monitor during the hospital course.

## 2023-02-01 NOTE — Assessment & Plan Note (Signed)
Hold Lipitor while on atorvastatin

## 2023-02-01 NOTE — TOC Initial Note (Signed)
Transition of Care [name]) - Initial/Assessment Note    Patient Details  Name: [name] [name] MRN: [id number] Date of Birth: 13-Oct-1960  Transition of Care [facility]) CM/SW Contact:    Darolyn Rua, LCSW Phone Number: 02/01/2023, 3:02 PM  Clinical Narrative:                  CSW spoke with patient regarding SNF recommendation made by PT, she reports no that she will be going home. Reports she did not feel like getting up today with PT but feels she will do better tomorrow.   She reports her daughter helps her at home, she has a RW, and her mother will pick up at discharge.   [facility] PCP: Etheleen Nicks, NP   Expected Discharge Plan: Home w Home Health Services Barriers to Discharge: Continued Medical Work up   Patient Goals and CMS Choice Patient states their goals for this hospitalization and ongoing recovery are:: to go home CMS Medicare.gov Compare Post Acute Care list provided to:: Patient Choice offered to / list presented to : Patient      Expected Discharge Plan and Services       Living arrangements for the past 2 months: Single Family Home                                      Prior Living Arrangements/Services Living arrangements for the past 2 months: Single Family Home Lives with:: Adult Children                   Activities of Daily Living   ADL Screening (condition at time of admission) Independently performs ADLs?: Yes (appropriate for developmental age) Is the patient deaf or have difficulty hearing?: No Does the patient have difficulty seeing, even when wearing glasses/contacts?: No Does the patient have difficulty concentrating, remembering, or making decisions?: No  Permission Sought/Granted                  Emotional Assessment              Admission diagnosis:  Lactic acidosis [E87.20] Fever and chills [R50.9] Community acquired pneumonia, unspecified laterality [J18.9] Sepsis without acute organ  dysfunction, due to unspecified organism [facility]) [A41.9] Patient Active Problem List   Diagnosis Date Noted   Acute respiratory failure with hypoxia (HCC) 02/01/2023   Multifocal pneumonia 02/01/2023   Acute on chronic diastolic CHF (congestive heart failure) (HCC) 02/01/2023   Acute UTI 01/31/2023   MRSA infection 01/13/2023   Gastroparesis 02/28/2022   Severe sepsis (HCC) 01/04/2022   Depression with anxiety    PAF (paroxysmal atrial fibrillation) (HCC)    Pulmonary fibrosis (HCC) 02/07/2020   Chronic respiratory failure with hypoxia (HCC) 02/07/2020   Anemia 02/07/2020   Morbid obesity with BMI of 50.0-59.9, adult (HCC) 07/08/2019   CKD stage 3b, GFR 30-44 ml/min (HCC) 04/06/2019   HLD (hyperlipidemia) 04/06/2019   Abdominal pain 02/12/2019   Diabetic ulcer of left foot (HCC) 02/14/2018   Chronic pain syndrome 03/31/2016   Osteoarthritis, multiple sites 12/24/2015   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[employer]) 12/23/2015   Long term prescription opiate use 12/23/2015   Diabetic peripheral neuropathy (HCC) 12/23/2015   COPD (chronic obstructive pulmonary disease) (HCC) 01/03/2014   Bipolar disorder, unspecified (HCC) 01/03/2014   Borderline personality disorder (HCC) 01/06/2012   Nausea & vomiting 08/04/2011   Hypothyroidism 06/28/2010  Rheumatoid arthritis involving multiple sites with positive rheumatoid factor (HCC) 06/28/2010   Essential (primary) hypertension 01/27/2005   Major depressive disorder, recurrent episode, moderate (HCC) 06/03/2004   PCP:  Etheleen Nicks, NP Pharmacy:   [facility] [address] [place] - [address] [place] [address] Phone: [phone] Fax: [phone]  [facility] - [place], [place] - [address] [place] [address] Phone: [phone] Fax: [phone]     Social Determinants of Health (SDOH) Social History: SDOH Screenings   Food Insecurity: No  Food Insecurity (02/01/2023)  Housing: Low Risk  (02/01/2023)  Transportation Needs: No Transportation Needs (02/01/2023)  Utilities: Not At Risk (02/01/2023)  Depression (PHQ2-9): High Risk (10/04/2022)  Financial Resource Strain: Medium Risk (06/02/2022)   [facility] from [facility]  Stress: Stress Concern Present (04/28/2022)  Tobacco Use: Medium Risk (01/31/2023)   SDOH Interventions:     Readmission Risk Interventions    01/05/2022   11:09 AM  Readmission Risk Prevention Plan  Transportation Screening Complete  PCP or Specialist Appt within 3-5 Days Complete  Social Work Consult for Recovery Care Planning/Counseling Complete  Palliative Care Screening Not Applicable  Medication Review [employer]) Complete

## 2023-02-01 NOTE — Assessment & Plan Note (Signed)
No abdominal pain on exam.,  I would refrain from NSAIDs.  IV PPI.

## 2023-02-01 NOTE — Progress Notes (Signed)
Progress Note   Patient: [name] [name] [id number] DOB: [birth year] DOA: 01/31/2023     1 DOS: the patient was seen and examined on 02/01/2023   Brief hospital course: 62 year old female past medical history of chronic diastolic heart failure, type 2 diabetes mellitus, GERD, hypertension, hypothyroidism, irritable bowel, depression, rheumatoid arthritis, obesity, MRSA infection.  Was recently at [facility] and prescribed daptomycin IV to go home with for MRSA infection of the foot.  Patient presenting with fever.  Started on empiric antibiotics.  Blood cultures negative.  10/15.  Now on high flow nasal cannula, chronically wears 4 L).  Patient little lethargic.  ABG does not show CO2 retention.  So far blood cultures are negative.  1 dose of Lasix given and fluids held.  Chest x-ray ordered.             Assessment and Plan: * Severe sepsis (HCC) Present on admission with fever, tachypnea, positive urine analysis, multifocal pneumonia on chest x-ray, previous treatment of MRSA with daptomycin.  Continue daptomycin.  Added aztreonam.  Try to add on urine culture.  Acute on chronic diastolic CHF (congestive heart failure) (HCC) Stopped IV fluids this morning given 1 dose of Lasix because of worsening respiratory status overnight.  Acute respiratory failure with hypoxia [facility]) Patient now on 8 L high flow nasal cannula with good saturations.  Chronically wears 4 L.  Did have a pulse ox overnight of 88% on 4 L.  Multifocal pneumonia Patient already on daptomycin.  Aztreonam added.  Will add Zithromax to cover atypicals.  Acute UTI Try to add on urine culture.  Aztreonam cover.  Chronic respiratory failure with hypoxia (HCC) Chronically on 4 L   Pulmonary fibrosis (HCC) With chronic respiratory failure on 4 L chronically.  Abdominal pain Improved.  CT scan negative  Diabetic ulcer of left foot (HCC) Continue daptomycin.  Nausea & vomiting Hold IV fluids  COPD (chronic  obstructive pulmonary disease) (HCC) Continue nebulizer treatments  HLD (hyperlipidemia) Hold Lipitor while on atorvastatin  Essential (primary) hypertension Hold blood pressure medication   PAF (paroxysmal atrial fibrillation) (HCC) Continue patient on Eliquis.  Depression with anxiety Continue Seroquel.  Hypothyroidism Continue levothyroxine.  Rheumatoid arthritis involving multiple sites with positive rheumatoid factor (HCC) .  Anemia Last hemoglobin 9.4 will check a ferritin tomorrow.  Morbid obesity with BMI of 50.0-59.9, adult (HCC) BMI 54.03  CKD stage 3b, GFR 30-44 ml/min (HCC) Continue to monitor during the hospital course.        Subjective: Patient did not sleep well last night.  Increasing oxygen requirements up to 8 L high flow nasal cannula.  Patient coming in with fever and confusion.  Some nausea vomiting.  Physical Exam HENT:     Head: Normocephalic.     Mouth/Throat:     Pharynx: No oropharyngeal exudate.  Eyes:     General: Lids are normal.     Conjunctiva/sclera: Conjunctivae normal.  Cardiovascular:     Rate and Rhythm: Normal rate and regular rhythm.     Heart sounds: Normal heart sounds and S1 normal.  Pulmonary:     Breath sounds: Examination of the right-lower field reveals decreased breath sounds and rales. Examination of the left-lower field reveals decreased breath sounds and rales. Decreased breath sounds and rales present. No wheezing or rhonchi.  Abdominal:     Palpations: Abdomen is soft.     Tenderness: There is no abdominal tenderness.  Musculoskeletal:     Right lower leg: Swelling present.  Left lower leg: Swelling present.  Skin:    General: Skin is warm.     Findings: No rash.  Neurological:     Mental Status: She is lethargic.     Data Reviewed: Blood cultures negative for 24 hours, urine analysis positive I do not see a urine culture ABG shows pH seven 7.33, pCO2 of 42, pO2 of 80 and on 8 L high flow nasal  cannula Creatinine 1.54, CRP 5.1, lactic acid peaked at 3.5, white blood cell count 7.7, hemoglobin 9.4, platelet count 204  Family Communication: Spoke with patient's mother on the phone  Disposition: Status is: Inpatient Remains inpatient appropriate because: Continue IV antibiotics for clinical sepsis  Planned Discharge Destination: To be determined based on clinical course    Time spent: 28 minutes  Author: Alford Highland, MD 02/01/2023 12:44 PM  For on call review [email or web address].

## 2023-02-01 NOTE — Assessment & Plan Note (Signed)
Supplemental oxygen titrated per respiratory therapy.  Continue Eliquis

## 2023-02-01 NOTE — Assessment & Plan Note (Signed)
Currently stable, abdominal CT shows normal lower part of lungs.

## 2023-02-01 NOTE — Assessment & Plan Note (Signed)
Monitor pulse oximetry with vitals.  Patient is currently continued on supplemental oxygen.  With as needed albuterol.  Will obtain a venous blood gas.

## 2023-02-01 NOTE — Assessment & Plan Note (Signed)
Supportive care with cautious IV fluids continue LR at 50.Marland Kitchen  Patient is already received 2500 mL per sepsis protocol.

## 2023-02-01 NOTE — Assessment & Plan Note (Signed)
Vitals:   01/31/23 1328 01/31/23 1500 01/31/23 1600 01/31/23 1720  BP: 115/87 (!) 106/53 (!) 125/97 (!) 110/55   01/31/23 1730 01/31/23 1800 01/31/23 1930 01/31/23 2000  BP: (!) 107/53 (!) 110/58 (!) 106/56 (!) 104/59   01/31/23 2034 01/31/23 2100 01/31/23 2203 01/31/23 2334  BP: (!) 104/55 116/66 97/71 (!) 145/96  Blood pressures are currently soft, therefore patient's metoprolol is held, amlodipine is held.

## 2023-02-01 NOTE — Assessment & Plan Note (Signed)
Continue daptomycin

## 2023-02-01 NOTE — Progress Notes (Signed)
Spoke with ID pharmacist and we will switch daptomycin to vancomycin for now with worsening respiratory status.

## 2023-02-01 NOTE — Assessment & Plan Note (Signed)
Mild AKI on CKD. Monitor renal function, avoid contrast and renally dose medications and antibiotics.

## 2023-02-02 ENCOUNTER — Inpatient Hospital Stay: Payer: Medicaid Other

## 2023-02-02 ENCOUNTER — Telehealth: Payer: Self-pay | Admitting: Pulmonary Disease

## 2023-02-02 DIAGNOSIS — R6520 Severe sepsis without septic shock: Secondary | ICD-10-CM | POA: Diagnosis not present

## 2023-02-02 DIAGNOSIS — M86172 Other acute osteomyelitis, left ankle and foot: Secondary | ICD-10-CM

## 2023-02-02 DIAGNOSIS — A419 Sepsis, unspecified organism: Secondary | ICD-10-CM | POA: Diagnosis not present

## 2023-02-02 DIAGNOSIS — J189 Pneumonia, unspecified organism: Secondary | ICD-10-CM

## 2023-02-02 DIAGNOSIS — L089 Local infection of the skin and subcutaneous tissue, unspecified: Secondary | ICD-10-CM | POA: Diagnosis not present

## 2023-02-02 DIAGNOSIS — A4902 Methicillin resistant Staphylococcus aureus infection, unspecified site: Secondary | ICD-10-CM | POA: Diagnosis not present

## 2023-02-02 LAB — CBC WITH DIFFERENTIAL/PLATELET
Abs Immature Granulocytes: 0.03 10*3/uL (ref 0.00–0.07)
Basophils Absolute: 0 10*3/uL (ref 0.0–0.1)
Basophils Relative: 1 %
Eosinophils Absolute: 0 10*3/uL (ref 0.0–0.5)
Eosinophils Relative: 1 %
HCT: 29.4 % — ABNORMAL LOW (ref 36.0–46.0)
Hemoglobin: 8.6 g/dL — ABNORMAL LOW (ref 12.0–15.0)
Immature Granulocytes: 1 %
Lymphocytes Relative: 33 %
Lymphs Abs: 0.7 10*3/uL (ref 0.7–4.0)
MCH: 22.1 pg — ABNORMAL LOW (ref 26.0–34.0)
MCHC: 29.3 g/dL — ABNORMAL LOW (ref 30.0–36.0)
MCV: 75.4 fL — ABNORMAL LOW (ref 80.0–100.0)
Monocytes Absolute: 0.3 10*3/uL (ref 0.1–1.0)
Monocytes Relative: 13 %
Neutro Abs: 1.1 10*3/uL — ABNORMAL LOW (ref 1.7–7.7)
Neutrophils Relative %: 51 %
Platelets: 181 10*3/uL (ref 150–400)
RBC: 3.9 MIL/uL (ref 3.87–5.11)
RDW: 19.1 % — ABNORMAL HIGH (ref 11.5–15.5)
WBC: 2.2 10*3/uL — ABNORMAL LOW (ref 4.0–10.5)
nRBC: 0 % (ref 0.0–0.2)

## 2023-02-02 LAB — URINE CULTURE
Culture: NO GROWTH
Special Requests: NORMAL

## 2023-02-02 LAB — RESPIRATORY PANEL BY PCR

## 2023-02-02 LAB — BASIC METABOLIC PANEL
Anion gap: 6 (ref 5–15)
BUN: 16 mg/dL (ref 8–23)
CO2: 22 mmol/L (ref 22–32)
Calcium: 7.9 mg/dL — ABNORMAL LOW (ref 8.9–10.3)
Chloride: 109 mmol/L (ref 98–111)
Creatinine, Ser: 1.24 mg/dL — ABNORMAL HIGH (ref 0.44–1.00)
GFR, Estimated: 50 mL/min — ABNORMAL LOW (ref >60)
Glucose, Bld: 111 mg/dL — ABNORMAL HIGH (ref 70–99)
Potassium: 3.6 mmol/L (ref 3.5–5.1)
Sodium: 137 mmol/L (ref 135–145)

## 2023-02-02 LAB — VANCOMYCIN, RANDOM: Vancomycin Rm: 11 ug/mL

## 2023-02-02 LAB — PROCALCITONIN: Procalcitonin: 6.71 ng/mL

## 2023-02-02 LAB — FERRITIN: Ferritin: 67 ng/mL (ref 11–307)

## 2023-02-02 LAB — BRAIN NATRIURETIC PEPTIDE: B Natriuretic Peptide: 34.2 pg/mL (ref 0.0–100.0)

## 2023-02-02 MED ORDER — VANCOMYCIN HCL 2000 MG/400ML IV SOLN
2000.0000 mg | Freq: Every day | INTRAVENOUS | Status: DC
  Administered 2023-02-02 – 2023-02-04 (×3): 2000 mg via INTRAVENOUS
  Filled 2023-02-02 (×3): qty 400

## 2023-02-02 NOTE — Telephone Encounter (Signed)
Patient has been seen in consultation.

## 2023-02-02 NOTE — Consult Note (Signed)
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  Patient was last seen at [facility] on 18 May 2022 at that time she was still participating in pulmonary rehab.  She was admitted to [facility] on 23 September with MRSA infection and osteomyelitis on the left fourth toe.  She was discharged on 19 January 2023 from that admission.  She was started on daptomycin at the recommendation of ID at [facility] and the recommendation was for 6 weeks of daptomycin via home infusion.  Patient however was admitted to [facility] on 14 October after she developed fever and encephalopathy and was noted to have bilateral patchy pulmonary infiltrates on chest x-ray.  The patient also noted some increased shortness of breath and was requiring oxygen at 4 L/min which is over her 3 L/min via nasal cannula at home.  Upon readmission to [facility] the patient was switched to vancomycin given the fever encephalopathy and pulmonary infiltrates.  We are asked to render opinion on these issues.  On my evaluation of the patient today she appears to be in no distress comfortably resting in bed.  She is on nasal cannula O2 with no air hunger evident.  She states that she is starting to feel better and her dyspnea is markedly improved.  She has not had any cough or sputum production.  No increased lower extremity edema over her baseline.  She does not endorse any other symptomatology today.  Pertinent  Medical History   Patient  Active Problem List   Diagnosis Date Noted   Acute respiratory failure with hypoxia (HCC) 02/01/2023   Multifocal pneumonia 02/01/2023   Acute on chronic diastolic CHF (congestive heart failure) (HCC) 02/01/2023   Acute UTI 01/31/2023   MRSA infection 01/13/2023   Gastroparesis 02/28/2022   Severe sepsis (HCC) 01/04/2022   Depression with anxiety    PAF (paroxysmal atrial fibrillation) (HCC)    Pulmonary fibrosis (HCC) 02/07/2020   Chronic respiratory failure with hypoxia (HCC) 02/07/2020   Anemia 02/07/2020   Morbid obesity with BMI of 50.0-59.9, adult (HCC) 07/08/2019   CKD stage 3b, GFR 30-44 ml/min (HCC) 04/06/2019   HLD (hyperlipidemia) 04/06/2019   Abdominal pain 02/12/2019   Diabetic ulcer of left foot (HCC) 02/14/2018   Chronic pain syndrome 03/31/2016   Osteoarthritis, multiple sites 12/24/2015   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[employer]) 12/23/2015   Long term prescription opiate use 12/23/2015   Diabetic peripheral neuropathy (HCC) 12/23/2015   COPD (chronic obstructive pulmonary disease) (HCC) 01/03/2014   Bipolar disorder, unspecified (HCC) 01/03/2014   Borderline personality disorder (HCC) 01/06/2012   Nausea & vomiting 08/04/2011   Hypothyroidism 06/28/2010   Rheumatoid arthritis involving multiple sites with positive rheumatoid factor (HCC) 06/28/2010   Essential (primary) hypertension 01/27/2005   Major depressive disorder, recurrent episode, moderate (HCC) 06/03/2004    Significant Hospital Events: Including procedures, antibiotic start and stop dates in addition to other pertinent events   01/31/2023 admitted with encephalopathy, fever and bilateral pulmonary infiltrates, daptomycin switched to vancomycin  Interim History / Subjective:  Patient states that he is feeling better from admission.  Objective   Blood pressure 108/67, pulse 76, temperature 98.5 F (36.9 C), resp. rate 18, height 5\' 7"  (1.702 m), weight (!) 156.5 kg, last  menstrual period 04/20/2001, SpO2 98%.        Intake/Output Summary (Last 24 hours) at 02/02/2023 1203 Last data filed at 02/02/2023 1106 Gross per 24 hour  Intake 556 ml  Output 1350 ml  Net -794 ml   Filed Weights   01/31/23 1329  Weight: (!) 156.5 kg    Examination: GENERAL: Morbidly obese woman, laying comfortably in bed, no respiratory distress.  Comfortable with nasal cannula O2 oxygen on.  No conversational dyspnea. HEAD: Normocephalic, atraumatic. EYES: Pupils equal, round, reactive to light.  No scleral icterus. MOUTH: Edentulous, oral mucosa moist.  No thrush. NECK: Supple. No thyromegaly. Trachea midline. No JVD.  No adenopathy. PULMONARY: Good air entry bilaterally.  Coarse, without wheezes or rhonchi.   CARDIOVASCULAR: S1 and S2. Regular rate and rhythm.  Grade 1/6 to 2/6 systolic ejection murmur left sternal border. ABDOMEN: Obese, soft, nondistended. MUSCULOSKELETAL: RA changes both hands.  No clubbing or edema noted, no increased warmth in the lower extremities. Chronic stasis changes but no overt edema. NEUROLOGIC: No overt focal deficit, speech is fluent. SKIN: Intact,warm,dry.  Chronic stasis changes lower extremities.   PSYCH: Mood and behavior normal.    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 Agree with discontinuing daptomycin Overall picture appears to be improving after discontinuation of Dapto Further recommendations after CT is done  MRSA infection/osteomyelitis ID being consulted Agree with vancomycin  Postinflammatory pulmonary fibrosis Asthmatic bronchitis Acute/chronic respiratory failure with hypoxia Wean O2 as tolerated for sats of 90 to 92% Appears to be compensating Continue DuoNeb as needed Consider adding Pulmicort twice a day  Diabetes mellitus type 2 in poor control Management per hospitalist team Aggressive  glycemic control will help with healing  Acute toxic metabolic encephalopathy Resolved Was likely due to hypoxia   Labs   CBC: Recent Labs  Lab 01/31/23 1342 02/01/23 0206 02/02/23 0551  WBC 9.9 7.7 2.2*  NEUTROABS 9.3*  --  1.1*  HGB 10.3* 9.4* 8.6*  HCT 35.1* 31.7* 29.4*  MCV 75.5* 76.0* 75.4*  PLT 285 204 181    Basic Metabolic Panel: Recent Labs  Lab 01/31/23 1342 02/01/23 0206 02/02/23 0551  NA 136 137 137  K 3.9 3.8 3.6  CL 105 106 109  CO2 19* 21* 22  GLUCOSE 266* 179* 111*  BUN 16 19 16   CREATININE 1.35* 1.54* 1.24*  CALCIUM 8.9 7.9* 7.9*   GFR: Estimated Creatinine Clearance: 74.9 mL/min (A) (by C-G formula based on SCr of 1.24 mg/dL (H)). Recent Labs  Lab 01/31/23 1342 01/31/23 1635 02/01/23 0206 02/02/23 0551  PROCALCITON 0.53  --   --   --   WBC 9.9  --  7.7 2.2*  LATICACIDVEN 3.5* 1.5  --   --     Liver Function Tests: Recent Labs  Lab 01/31/23 1342 02/01/23 0206  AST 23 16  ALT 16 14  ALKPHOS 72 64  BILITOT 0.6 0.7  PROT 7.7 6.6  ALBUMIN 3.4* 3.0*   No results for input(s): "LIPASE", "AMYLASE" in the last 168 hours. No results for input(s): "AMMONIA" in the last 168 hours.  ABG    Component Value Date/Time   PHART 7.33 (L) 02/01/2023 0750   PCO2ART  42 02/01/2023 0750   PO2ART 80 (L) 02/01/2023 0750   HCO3 22.1 02/01/2023 0750   ACIDBASEDEF 3.7 (H) 02/01/2023 0750   O2SAT 97.4 02/01/2023 0750     Coagulation Profile: Recent Labs  Lab 01/31/23 1342  INR 1.6*    Cardiac Enzymes: Recent Labs  Lab 02/01/23 0640  CKTOTAL 184    HbA1C: Hemoglobin A1C  Date/Time Value Ref Range Status  12/02/2013 08:36 AM 13.1 (H) 4.2 - 6.3 % Final    Comment:    The American Diabetes Association recommends that a primary goal of therapy should be <7% and that physicians should reevaluate the treatment regimen in patients with HbA1c values consistently >8%.   06/26/2013 04:07 AM 11.3 (H) 4.2 - 6.3 % Final    Comment:    The  American Diabetes Association recommends that a primary goal of therapy should be <7% and that physicians should reevaluate the treatment regimen in patients with HbA1c values consistently >8%.    Hgb A1c MFr Bld  Date/Time Value Ref Range Status  01/04/2022 02:31 AM 6.8 (H) 4.8 - 5.6 % Final    Comment:    (NOTE) Pre diabetes:          5.7%-6.4%  Diabetes:              >6.4%  Glycemic control for   <7.0% adults with diabetes   01/10/2020 05:41 AM 10.8 (H) 4.8 - 5.6 % Final    Comment:    (NOTE) Pre diabetes:          5.7%-6.4%  Diabetes:              >6.4%  Glycemic control for   <7.0% adults with diabetes     CBG: Recent Labs  Lab 01/31/23 1332 02/01/23 0029  GLUCAP 224* 144*    Review of Systems:   A 10 point review of systems was performed and it is as noted above otherwise negative.  Past Medical History:  She,  has a past medical history of Abdominal pain (02/12/2019), Abdominal wall hernia (01/29/2013), Acute metabolic encephalopathy (07/08/2019), AMS (altered mental status) (12/28/2021), Anxiety, Arthritis, C. difficile colitis, Chronic diastolic heart failure (HCC), [id number] (03/23/2019), Depression, Diabetes mellitus, Diastolic CHF (HCC), Esophagitis, Fluid retention, GERD (gastroesophageal reflux disease), Hiatal hernia, Hypertension, Hypokalemia due to loss of potassium (10/21/2015), Hypothyroidism, IBS (irritable bowel syndrome), Moderate episode of recurrent major depressive disorder (HCC) (06/03/2004), Morbid obesity (HCC), MRSA (methicillin resistant Staphylococcus aureus) infection (11/2017), Neurogenic bladder, Neuropathy, Obesity, Panic attacks, Pneumonia due to COVID-19 virus, Rheumatoid arthritis (HCC), and Sleep apnea.   Surgical History:   Past Surgical History:  Procedure Laterality Date   ABDOMINAL HYSTERECTOMY     CHOLECYSTECTOMY     DG GREAT TOE RIGHT FOOT  02/23/2018   EYE SURGERY     bilateral cataract extraction with IOL   FOOT SURGERY Left  05/13/2021   [facility]   HERNIA REPAIR     ventral hernia with strangulation   IRRIGATION AND DEBRIDEMENT FOOT Left 01/11/2020   Procedure: IRRIGATION AND DEBRIDEMENT FOOT;  Surgeon: Gwyneth Revels, DPM;  Location: [facility] ORS;  Service: Podiatry;  Laterality: Left;   LAPAROSCOPIC GASTRIC BANDING  03/20/2007   TEE WITHOUT CARDIOVERSION N/A 07/16/2019   Procedure: TRANSESOPHAGEAL ECHOCARDIOGRAM (TEE);  Surgeon: Debbe Odea, MD;  Location: [facility] ORS;  Service: Cardiovascular;  Laterality: N/A;   TONSILLECTOMY     TUBAL LIGATION       Social History:   reports that she quit smoking about 23 years  ago. Her smoking use included cigarettes. She started smoking about 50 years ago. She has a 54 pack-year smoking history. She has never used smokeless tobacco. She reports that she does not drink alcohol and does not use drugs.   Family History:  Her family history includes Alcohol abuse in her father and sister; Anxiety disorder in her father, sister, and sister; Bipolar disorder in her father and sister; Depression in her father, sister, and sister; Drug abuse in her sister; Heart attack in her brother; Heart attack (age of onset: 60) in her brother; Heart disease in her brother; Heart failure in her father.   Allergies Allergies  Allergen Reactions   Cefepime Anaphylaxis    lips swell and the break out in blisters.   Ceftriaxone Anaphylaxis    Angioedema while admitted at [facility] 01/12/2023   Cephalexin Hives   Codeine Palpitations, Nausea Only, Nausea And Vomiting, Rash and Shortness Of Breath    "makes heart fly, she gets flushed and passes out"   Doxycycline Rash   Propoxyphene Rash and Shortness Of Breath    Increase heart rate   Sulfa Antibiotics Palpitations, Nausea Only, Shortness Of Breath and Hives    "makes heart fly, she gets flushed and passes out"   Clindamycin Rash    Tongue swelling, oral sores, and Mouth rash   Lovenox [Enoxaparin Sodium] Hives   Hydrocodone Nausea And Vomiting     Hear racing & breaks out into a cold sweat.   Meropenem Rash    Erythematous, hot, pruritic rash over arms, chest, back, abdomen, and face occurred at the end of meropenem infusion on 02/22/18     Home Medications  Prior to Admission medications   Medication Sig Start Date End Date Taking? Authorizing Provider  pramipexole (MIRAPEX) 0.125 MG tablet Take 0.25 mg by mouth at bedtime. 12/17/22  Yes [provider]  acetaminophen (TYLENOL) 325 MG tablet Take 2 tablets (650 mg total) by mouth every 6 (six) hours as needed for mild pain or moderate pain (or Fever >/= 101). 01/15/20   Marguerita Merles Latif, DO  amLODipine (NORVASC) 10 MG tablet Take 10 mg by mouth daily. 12/10/21   [provider]  ARIPiprazole (ABILIFY) 5 MG tablet Take 1 tablet (5 mg total) by mouth daily. 08/03/22   Clapacs, Jackquline Denmark, MD  ascorbic acid (VITAMIN C) 500 MG tablet Take 1 tablet (500 mg total) by mouth daily. 05/05/19   Rolly Salter, MD  atorvastatin (LIPITOR) 80 MG tablet Take 80 mg by mouth daily. 12/10/21   [provider]  Budeson-Glycopyrrol-Formoterol (BREZTRI AEROSPHERE) 160-9-4.8 MCG/ACT AERO Inhale 2 puffs into the lungs in the morning and at bedtime. 10/01/21   Salena Saner, MD  busPIRone (BUSPAR) 10 MG tablet Take 1 tablet (10 mg total) by mouth 2 (two) times daily. 08/03/22   Clapacs, Jackquline Denmark, MD  Calcium Citrate-Vitamin D3 (GNP CALCIUM CITRATE+D MAXIMUM) 315-6.25 MG-MCG TABS Take 1 tablet by mouth daily. 12/10/21   [provider]  Cholecalciferol (VITAMIN D) 125 MCG (5000 UT) CAPS Take 1 capsule by mouth daily. 11/29/19   [provider]  ELIQUIS 5 MG TABS tablet TAKE 1 TABLET BY MOUTH TWICE DAILY 06/26/21   Salena Saner, MD  famotidine (PEPCID) 20 MG tablet Take 1 tablet (20 mg total) by mouth daily. 05/05/19   Rolly Salter, MD  FLUoxetine (PROZAC) 40 MG capsule Take 1 capsule (40 mg total) by mouth daily. 08/03/22 01/30/23  Clapacs, Jackquline Denmark, MD  folic  acid  (FOLVITE) 1 MG tablet Take 1 mg by mouth daily.     [provider]  hydroxychloroquine (PLAQUENIL) 200 MG tablet Take 200 mg by mouth 2 (two) times daily. 12/10/21   [provider]  levothyroxine (SYNTHROID, LEVOTHROID) 88 MCG tablet Take 88 mcg by mouth daily before breakfast.    [provider]  metFORMIN (GLUCOPHAGE-XR) 500 MG 24 hr tablet Take 500 mg by mouth daily. 12/31/19   [provider]  methotrexate (RHEUMATREX) 2.5 MG tablet Take 2.5 mg by mouth once a week.    [provider]  metoCLOPramide (REGLAN) 5 MG/5ML solution Take 5 mg by mouth 4 (four) times daily -  before meals and at bedtime. 12/20/21   [provider]  metoprolol tartrate (LOPRESSOR) 25 MG tablet Take 25 mg by mouth 2 (two) times daily. 10/10/20   [provider]  pramipexole (MIRAPEX) 0.5 MG tablet Take 0.5 mg by mouth at bedtime. 03/23/22   [provider]  pregabalin (LYRICA) 100 MG capsule Take 1 capsule (100 mg total) by mouth 3 (three) times daily. 05/20/22   Edward Jolly, MD  promethazine (PHENERGAN) 12.5 MG tablet Take 12.5 mg by mouth. 02/26/22   [provider]  QUEtiapine (SEROQUEL) 300 MG tablet TAKE ONE TABLET BY MOUTH AT BEDTIME. 08/03/22   Clapacs, Jackquline Denmark, MD  TRADJENTA 5 MG TABS tablet Take 5 mg by mouth daily. 12/31/19   [provider]  zinc sulfate 220 (50 Zn) MG capsule Take 220 mg by mouth at bedtime.  12/31/19   [provider]  zolpidem (AMBIEN) 5 MG tablet Take 1 tablet (5 mg total) by mouth at bedtime. 08/03/22   Clapacs, Jackquline Denmark, MD    Scheduled Meds:  apixaban  5 mg Oral BID   ARIPiprazole  5 mg Oral Daily   azithromycin  250 mg Oral Daily   budesonide  0.5 mg Nebulization BID   busPIRone  10 mg Oral BID   Chlorhexidine Gluconate Cloth  6 each Topical Daily   FLUoxetine  40 mg Oral Daily   hydroxychloroquine  200 mg Oral BID   influenza vac split trivalent PF  0.5 mL Intramuscular Tomorrow-1000    levothyroxine  88 mcg Oral Q0600   pantoprazole (PROTONIX) IV  40 mg Intravenous Q12H   QUEtiapine  300 mg Oral QHS   sodium chloride flush  3 mL Intravenous Q12H   Continuous Infusions:  aztreonam 200 mL/hr at 02/02/23 1552   promethazine (PHENERGAN) injection (IM or IVPB) 150 mL/hr at 02/02/23 1552   vancomycin 200 mL/hr at 02/02/23 1552   PRN Meds:.acetaminophen **OR** acetaminophen, ipratropium-albuterol, ondansetron (ZOFRAN) IV, mouth rinse, promethazine (PHENERGAN) injection (IM or IVPB)   Consult level 4    Will review CT chest when available.  Gailen Shelter, MD Advanced Bronchoscopy PCCM [facility]    *This note was dictated using voice recognition software/Dragon.  Despite best efforts to proofread, errors can occur which can change the meaning. Any transcriptional errors that result from this process are unintentional and may not be fully corrected at the time of dictation.

## 2023-02-02 NOTE — Telephone Encounter (Addendum)
Called and spoke to patient.  She wanted to make Dr. Jayme Cloud aware that she is admitted. She is currently on 4L and experiencing increased SOB. Dx with PNA. Patient would like Dr. Jayme Cloud to come by and see her. Patient's mother, Betty(DPR) is aware that a message has been sent to Dr. Jayme Cloud.   Dr. Jayme Cloud, please advise. Thanks

## 2023-02-02 NOTE — Plan of Care (Signed)
  Problem: Fluid Volume: Goal: Hemodynamic stability will improve Outcome: Progressing   Problem: Clinical Measurements: Goal: Signs and symptoms of infection will decrease Outcome: Progressing   Problem: Respiratory: Goal: Ability to maintain adequate ventilation will improve Outcome: Progressing   Problem: Education: Goal: Knowledge of General Education information will improve Description: Including pain rating scale, medication(s)/side effects and non-pharmacologic comfort measures Outcome: Progressing   Problem: Health Behavior/Discharge Planning: Goal: Ability to manage health-related needs will improve Outcome: Progressing   Problem: Clinical Measurements: Goal: Ability to maintain clinical measurements within normal limits will improve Outcome: Progressing Goal: Will remain free from infection Outcome: Progressing Goal: Diagnostic test results will improve Outcome: Progressing Goal: Respiratory complications will improve Outcome: Progressing   Problem: Activity: Goal: Risk for activity intolerance will decrease Outcome: Progressing   Problem: Coping: Goal: Level of anxiety will decrease Outcome: Progressing   Problem: Elimination: Goal: Will not experience complications related to bowel motility Outcome: Progressing   Problem: Pain Managment: Goal: General experience of comfort will improve Outcome: Progressing   Problem: Skin Integrity: Goal: Risk for impaired skin integrity will decrease Outcome: Progressing

## 2023-02-02 NOTE — Telephone Encounter (Signed)
Her oxygen Liter Level has went up to 4L, not sure what's going on with her as of right now. Will like for Dr. Jayme Cloud to check on her

## 2023-02-02 NOTE — Progress Notes (Signed)
Pharmacy Antibiotic Note  [name] [name] is a 62 y.o. female admitted on 01/31/2023 with sepsis, UTI, and wound infection .  Patient has a wound culture from 9/23 showing MRSA. At her last hospitalization with [facility], she was initially on CTX/vanc targeting MRSA and Proteus but the CTX was discontinued after developing mild angioedema felt secondary to drug allergy. 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 She was set up with OPAT and home infusion via PICC line prior to discharge. Her dose daptomycin dose at home was 750 mg q24h (end date 02/20/23). [facility] was managing her home infusion. Pharmacy has been consulted for vancomycin dosing. Cefepime has been changed to aztreonam due to recent concern for reaction to ceftriaxone at [facility].   Today, 02/02/2023 Dr Renae Gloss contacted by pharmacist regarding possibility of daptomycin-induced eosinophilic pneumonia.  Patient presents with fever, N/V, confusion and foot pain.  Chest Xray possible multifocal pneumonia.  She is currently requiring HFNC Typical signs/symptoms of daptomycin-induced eosinphilic pneumonia are fever, cough, shortness of breath, and pneumonia (multifocal) per imaging.  Diagnosis of exclusion but eosinphils on BAL would be expected finding Renal: SCr improved to 1.2   Patient was on vancomycin during admission at [facility] - on 9/25 had therapeutic trough (14.8 mcg/ml) on vancomycin 2gm IV q24h. SCr at that time was 1.20 WBC decreased at 2.2 Tmax/24h 100.5 10/15 CK = 184 Patient received total of vancomycin 2.5gm at 1740 10/14 Note patient with multiple allergies including concern for angioedema while on ceftriaxone at [facility] on 01/12/23  Vancomycin levels: 10/15 random vancomycin level at 21:47 = 11 mcg/mL  Received vancomycin 2.5gm IV x 1 10/14 at ~17:30  Plan: Based on recent levels at [facility] with similar SCr to  current SCr value, start vancomycin 2gm IV q24h (trough was 14.8 mcg/mL on this dose at [facility]) Monitor SCr and need to recheck vancomycin trough  Continue aztreonam to 2gm IV q8h for renal function and obesity Has multiple antibiotic allergies (seem to be real with serious reactions, most recently with ceftriaxone at [facility] experience angioedema)  Pharmacy will continue to monitor and adjust dose as needed [facility] ID fellow, Dr Letta Moynahan.  She asked that we communicate discharge antibiotic plan with OPAT team Per Lelon Perla: If you are able to communicate this, it would be helpful to have the eventual discharging team communicate with our OPAT team in order to coordinate a plan for them to continue following after discharge:   [facility] OPAT Program Phone: [phone]  [facility] OPAT Program Fax:  [phone]   Height: 5\' 7"  (170.2 cm) Weight: (!) 156.5 kg (345 lb) IBW/kg (Calculated) : 61.6  Temp (24hrs), Avg:99 F (37.2 C), Min:98.1 F (36.7 C), Max:100.5 F (38.1 C)  Recent Labs  Lab 01/31/23 1342 01/31/23 1635 02/01/23 0206 02/01/23 2147 02/02/23 0551  WBC 9.9  --  7.7  --  2.2*  CREATININE 1.35*  --  1.54*  --  1.24*  LATICACIDVEN 3.5* 1.5  --   --   --   VANCORANDOM  --   --   --  11  --     Estimated Creatinine Clearance: 74.9 mL/min (A) (by C-G formula based on SCr of 1.24 mg/dL (H)).    Allergies  Allergen Reactions   Cefepime Anaphylaxis    lips swell and the break out in blisters.   Ceftriaxone Anaphylaxis  Angioedema while admitted at [facility] 01/12/2023   Cephalexin Hives   Codeine Palpitations, Nausea Only, Nausea And Vomiting, Rash and Shortness Of Breath    "makes heart fly, she gets flushed and passes out"   Doxycycline Rash   Propoxyphene Rash and Shortness Of Breath    Increase heart rate   Sulfa Antibiotics Palpitations, Nausea Only, Shortness Of Breath and Hives    "makes heart fly, she gets flushed and passes out"   Clindamycin Rash    Tongue swelling,  oral sores, and Mouth rash   Lovenox [Enoxaparin Sodium] Hives   Hydrocodone Nausea And Vomiting    Hear racing & breaks out into a cold sweat.   Meropenem Rash    Erythematous, hot, pruritic rash over arms, chest, back, abdomen, and face occurred at the end of meropenem infusion on 02/22/18    Antimicrobials this admission: 10/14 vancomycin  x 1 10/14 cefepime >>  10/14 daptomycin>>10/15 10/14 vancomycin >>  Dose adjustments this admission: None  Microbiology results: 10/14 BCx: NGTD 10/15 ucx:  MRSA PCR neg   Thank you for allowing pharmacy to be a part of this patient's care.  Juliette Alcide, PharmD, BCPS, BCIDP Work Cell: [phone] 02/02/2023 8:17 AM

## 2023-02-02 NOTE — Progress Notes (Signed)
Physical Therapy Treatment Patient Details Name: [name] [name] MRN: [id number] DOB: 1961/01/02 Today's Date: 02/02/2023   History of Present Illness Pt is a 62 y.o. female  with h/o depression, RA, obesity, hypothyroidism, diabetic neuropathy, PVD, chronic wound/infection of L fourth toe on IV Daptomycin via PICC line here with fever. Pt reports 2 days of fever, nausea, and poor appetite. She has felt fatigued, sleepy, and "awful." Denies any feeling in her foot so is unsure whether it is more painful or not. She does note persistent redness there though does state that it appears to be improving since starting dapto 2 weeks ago. She has had some diarrhea but feels it "isn't as bad" as C. Diff she has had before. No other complaints.    PT Comments  Pt is received in bed, she is agreeable to PT session. Pt performs bed mobility and transfer close supA for safety. Pt able to perform step pivot transfer from bed to recliner with close supA for safety although Pt did not demonstrate LOB during activity. Pt requires min additional time for completion of tasks secondary to fatigue, although improving. Unable to assess further mobility at this time due to Pt wanting to sit in recliner to rest and make "phone calls". Overall, Pt demonstrates steady progression towards PT goals and would benefit from cont skilled PT to address above deficits and promote optimal return to PLOF. Communicated with TOC regarding change of discharge disposition via secure chat.   If plan is discharge home, recommend the following: A lot of help with bathing/dressing/bathroom;Assist for transportation;Help with stairs or ramp for entrance;A little help with walking and/or transfers   Can travel by private vehicle     Yes  Equipment Recommendations  None recommended by PT    Recommendations for Other Services       Precautions / Restrictions Precautions Precautions: Fall Restrictions Weight Bearing Restrictions: No      Mobility  Bed Mobility Overal bed mobility: Needs Assistance Bed Mobility: Supine to Sit     Supine to sit: Supervision, HOB elevated, Used rails     General bed mobility comments: Pt able to perform bed mobility close supA with use of hand railings and elevated HOB; no cuing needed    Transfers Overall transfer level: Needs assistance Equipment used: None Transfers: Bed to chair/wheelchair/BSC     Step pivot transfers: Supervision, Modified independent (Device/Increase time)       General transfer comment: Pt able to perform step pivot transfer from bed to recliner without use of AD, although did use bed railings, close supA for safety but no cuing required    Ambulation/Gait               General Gait Details: Deferred at this time due to Pt requesting to eat food   Stairs             Wheelchair Mobility     Tilt Bed    Modified Rankin (Stroke Patients Only)       Balance Overall balance assessment: Needs assistance Sitting-balance support: Feet supported Sitting balance-Leahy Scale: Good Sitting balance - Comments: Pt able to maintain seated EOB balance during functional activities without LOB noted   Standing balance support: No upper extremity supported, During functional activity Standing balance-Leahy Scale: Good Standing balance comment: Pt able to maintain static and dynamic standing balance during functional activity without LOB noted  Cognition Arousal: Alert Behavior During Therapy: WFL for tasks assessed/performed Overall Cognitive Status: Within Functional Limits for tasks assessed                                 General Comments: Pleasant and motivated to work with PT        Exercises      General Comments        Pertinent Vitals/Pain Pain Assessment Pain Assessment: No/denies pain    Home Living                          Prior Function             PT Goals (current goals can now be found in the care plan section) Acute Rehab PT Goals Patient Stated Goal: to get stronger PT Goal Formulation: With patient Time For Goal Achievement: 02/15/23 Potential to Achieve Goals: Good Progress towards PT goals: Progressing toward goals    Frequency    Min 1X/week      PT Plan      Co-evaluation              AM-PAC PT "6 Clicks" Mobility   Outcome Measure  Help needed turning from your back to your side while in a flat bed without using bedrails?: None Help needed moving from lying on your back to sitting on the side of a flat bed without using bedrails?: None Help needed moving to and from a bed to a chair (including a wheelchair)?: None Help needed standing up from a chair using your arms (e.g., wheelchair or bedside chair)?: None Help needed to walk in hospital room?: A Little Help needed climbing 3-5 steps with a railing? : A Lot 6 Click Score: 21    End of Session Equipment Utilized During Treatment: Oxygen Activity Tolerance: Patient tolerated treatment well Patient left: in chair;with call bell/phone within reach;with chair alarm set;with nursing/sitter in room Nurse Communication: Mobility status PT Visit Diagnosis: Unsteadiness on feet (R26.81);Muscle weakness (generalized) (M62.81);Pain Pain - Right/Left: Left Pain - part of body: Ankle and joints of foot     Time: 6962-9528 PT Time Calculation (min) (ACUTE ONLY): 15 min  Charges:                            Elmon Else, SPT    Jubilee Vivero 02/02/2023, 2:56 PM

## 2023-02-02 NOTE — Consult Note (Signed)
NAME: [name] [name]  DOB: January 29, 1961  MRN: [id number]  Date/Time: 02/02/2023 12:54 PM  REQUESTING PROVIDER: Dr.Patel Subjective:  REASON FOR CONSULT: toe infection ? [name] [name] is a 62 y.o. 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 Recently in [facility]  9/23-10/2 for left 4th toe infection MRSA in culture and was seen by ID and  being treated for osteomyelitis  with daptomycin 750mg  IV every day for 6 weeks , presented to the ED with fever and not feeling well, with nausea Has baseline sob and has home oxygen In the ED vitals  01/31/23 13:28  BP 115/87  Temp 101.4 F (38.6 C) !  Pulse Rate 80  Resp 24 !  SpO2 98 %  O2 Flow Rate (L/min) 4 L/min    Latest Reference Range & Units 01/31/23 13:42  WBC 4.0 - 10.5 K/uL 9.9  Hemoglobin 12.0 - 15.0 g/dL 10.9 (L)  HCT 32.3 - 55.7 % 35.1 (L)  Platelets 150 - 400 K/uL 285  Creatinine 0.44 - 1.00 mg/dL 3.22 (H)   CXR shows b/l infiltrate COVID/Flu neg Pt on vancomycin, aztreonam, azithromycin I am seeing the patient for the toe infection and antibiotic management  Past Medical History:  Diagnosis Date   Abdominal pain 02/12/2019   Abdominal wall hernia 01/29/2013   Acute metabolic encephalopathy 07/08/2019   AMS (altered mental status) 12/28/2021   Anxiety    Arthritis    Rheumatoid   C. difficile colitis    Chronic diastolic heart failure (HCC)    COVID-19 03/23/2019   Diagnosed at [facility] (send-out) on 03/23/2019   Depression    Diabetes mellitus    states no meds or diet restrictions  at present   Diastolic CHF (HCC)    Esophagitis    Fluid retention    GERD (gastroesophageal reflux disease)    Hiatal hernia    Hypertension    Hypokalemia due to loss of potassium 10/21/2015   Overview:  Associated with 3 weeks of diarrhea  And QT prolongation.   Hypothyroidism    IBS  (irritable bowel syndrome)    Moderate episode of recurrent major depressive disorder (HCC) 06/03/2004   Morbid obesity (HCC)    MRSA (methicillin resistant Staphylococcus aureus) infection 11/2017   left inner thigh abcess   Neurogenic bladder    has pacemaker   Neuropathy    Obesity    Panic attacks    Pneumonia due to COVID-19 virus    Rheumatoid arthritis (HCC)    Sleep apnea    STATES SEVERE, CANT TOLERATE MASK- LAST STUDY YEARS AGO    Past Surgical History:  Procedure Laterality Date   ABDOMINAL HYSTERECTOMY     CHOLECYSTECTOMY     DG GREAT TOE RIGHT FOOT  02/23/2018   EYE SURGERY     bilateral cataract extraction with IOL   FOOT SURGERY Left 05/13/2021   [facility]   HERNIA REPAIR     ventral hernia with strangulation   IRRIGATION AND DEBRIDEMENT FOOT Left 01/11/2020   Procedure: IRRIGATION AND DEBRIDEMENT FOOT;  Surgeon: Gwyneth Revels, DPM;  Location: [facility] ORS;  Service: Podiatry;  Laterality: Left;   LAPAROSCOPIC GASTRIC BANDING  03/20/2007   TEE WITHOUT CARDIOVERSION N/A 07/16/2019   Procedure: TRANSESOPHAGEAL ECHOCARDIOGRAM (TEE);  Surgeon: Debbe Odea, MD;  Location: [facility] ORS;  Service: Cardiovascular;  Laterality: N/A;   TONSILLECTOMY     TUBAL  LIGATION      Social History   Socioeconomic History   Marital status: Divorced    Spouse name: Not on file   Number of children: Not on file   Years of education: Not on file   Highest education level: Not on file  Occupational History   Not on file  Tobacco Use   Smoking status: Former    Current packs/day: 0.00    Average packs/day: 2.0 packs/day for 27.0 years (54.0 ttl pk-yrs)    Types: Cigarettes    Start date: 07/29/1972    Quit date: 07/30/1999    Years since quitting: 23.5   Smokeless tobacco: Never   Tobacco comments:    quit in 2001 2-2.5 a day  Vaping Use   Vaping status: Never Used  Substance and Sexual Activity   Alcohol use: No   Drug use: No   Sexual activity: Not Currently  Other Topics  Concern   Not on file  Social History Narrative   Not on file   Social Determinants of Health   Financial Resource Strain: Medium Risk (06/02/2022)   Received from [facility]   Overall Financial Resource Strain (CARDIA)    Difficulty of Paying Living Expenses: Somewhat hard  Food Insecurity: No Food Insecurity (02/01/2023)   Hunger Vital Sign    Worried About Running Out of Food in the Last Year: Never true    Ran Out of Food in the Last Year: Never true  Transportation Needs: No Transportation Needs (02/01/2023)   PRAPARE - Administrator, Civil Service (Medical): No    Lack of Transportation (Non-Medical): No  Physical Activity: Not on file  Stress: Stress Concern Present (04/28/2022)   [employer] of Occupational Health - Occupational Stress Questionnaire    Feeling of Stress : To some extent  Social Connections: Not on file  Intimate Partner Violence: Not At Risk (02/01/2023)   Humiliation, Afraid, Rape, and Kick questionnaire    Fear of Current or Ex-Partner: No    Emotionally Abused: No    Physically Abused: No    Sexually Abused: No    Family History  Problem Relation Age of Onset   Heart failure Father    Bipolar disorder Father    Alcohol abuse Father    Anxiety disorder Father    Depression Father    Heart disease Brother    Heart attack Brother 73       MI s/p stents placed   Anxiety disorder Sister    Depression Sister    Anxiety disorder Sister    Depression Sister    Bipolar disorder Sister    Alcohol abuse Sister    Drug abuse Sister    Heart attack Brother    Allergies  Allergen Reactions   Cefepime Anaphylaxis    lips swell and the break out in blisters.   Ceftriaxone Anaphylaxis    Angioedema while admitted at [facility] 01/12/2023   Cephalexin Hives   Codeine Palpitations, Nausea Only, Nausea And Vomiting, Rash and Shortness Of Breath    "makes heart fly, she gets flushed and passes out"   Doxycycline Rash    Propoxyphene Rash and Shortness Of Breath    Increase heart rate   Sulfa Antibiotics Palpitations, Nausea Only, Shortness Of Breath and Hives    "makes heart fly, she gets flushed and passes out"   Clindamycin Rash    Tongue swelling, oral sores, and Mouth rash   Lovenox [Enoxaparin Sodium]  Hives   Hydrocodone Nausea And Vomiting    Hear racing & breaks out into a cold sweat.   Meropenem Rash    Erythematous, hot, pruritic rash over arms, chest, back, abdomen, and face occurred at the end of meropenem infusion on 02/22/18   I? Current Facility-Administered Medications  Medication Dose Route Frequency Provider Last Rate Last Admin   acetaminophen (TYLENOL) tablet 650 mg  650 mg Oral Q6H PRN Gertha Calkin, MD       Or   acetaminophen (TYLENOL) suppository 650 mg  650 mg Rectal Q6H PRN Gertha Calkin, MD       apixaban (ELIQUIS) tablet 5 mg  5 mg Oral BID Irena Cords V, MD   5 mg at 02/02/23 0914   ARIPiprazole (ABILIFY) tablet 5 mg  5 mg Oral Daily Irena Cords V, MD   5 mg at 02/02/23 0914   azithromycin (ZITHROMAX) tablet 250 mg  250 mg Oral Daily Alford Highland, MD   250 mg at 02/02/23 0913   aztreonam (AZACTAM) 2 g in sodium chloride 0.9 % 100 mL IVPB  2 g Intravenous Q8H Aleda Grana, RPH 200 mL/hr at 02/02/23 0645 2 g at 02/02/23 0645   budesonide (PULMICORT) nebulizer solution 0.5 mg  0.5 mg Nebulization BID Irena Cords V, MD   0.5 mg at 02/02/23 0742   busPIRone (BUSPAR) tablet 10 mg  10 mg Oral BID Gertha Calkin, MD   10 mg at 02/02/23 0915   Chlorhexidine Gluconate Cloth 2 % PADS 6 each  6 each Topical Daily Gertha Calkin, MD   6 each at 02/02/23 0917   FLUoxetine (PROZAC) capsule 40 mg  40 mg Oral Daily Irena Cords V, MD   40 mg at 02/02/23 7062   hydroxychloroquine (PLAQUENIL) tablet 200 mg  200 mg Oral BID Irena Cords V, MD   200 mg at 02/02/23 0915   influenza vac split trivalent PF (FLULAVAL) injection 0.5 mL  0.5 mL Intramuscular Tomorrow-1000 Irena Cords V, MD        ipratropium-albuterol (DUONEB) 0.5-2.5 (3) MG/3ML nebulizer solution 3 mL  3 mL Nebulization Q4H PRN Gertha Calkin, MD   3 mL at 02/01/23 2023   levothyroxine (SYNTHROID) tablet 88 mcg  88 mcg Oral Q0600 Gertha Calkin, MD   88 mcg at 02/02/23 0602   ondansetron (ZOFRAN) injection 4 mg  4 mg Intravenous Q6H PRN Alford Highland, MD   4 mg at 02/02/23 3762   Oral care mouth rinse  15 mL Mouth Rinse PRN Alford Highland, MD       pantoprazole (PROTONIX) injection 40 mg  40 mg Intravenous Q12H Irena Cords V, MD   40 mg at 02/02/23 0914   promethazine (PHENERGAN) 12.5 mg in sodium chloride 0.9 % 50 mL IVPB  12.5 mg Intravenous Q6H PRN Lindajo Royal V, MD 150 mL/hr at 02/01/23 2035 12.5 mg at 02/01/23 2035   QUEtiapine (SEROQUEL) tablet 300 mg  300 mg Oral QHS Irena Cords V, MD   300 mg at 02/01/23 2041   sodium chloride flush (NS) 0.9 % injection 3 mL  3 mL Intravenous Q12H Irena Cords V, MD   3 mL at 02/02/23 0916   vancomycin (VANCOREADY) IVPB 2000 mg/400 mL  2,000 mg Intravenous Daily Aleda Grana, RPH 200 mL/hr at 02/02/23 0911 2,000 mg at 02/02/23 0911     Abtx:  Anti-infectives (From admission, onward)    Start     Dose/Rate Route Frequency  Ordered Stop   02/02/23 1000  azithromycin (ZITHROMAX) tablet 250 mg       Placed in "Followed by" Linked Group   250 mg Oral Daily 02/01/23 1230 02/06/23 0959   02/02/23 0915  vancomycin (VANCOREADY) IVPB 2000 mg/400 mL        2,000 mg 200 mL/hr over 120 Minutes Intravenous Daily 02/02/23 0816     02/01/23 2200  aztreonam (AZACTAM) 2 g in sodium chloride 0.9 % 100 mL IVPB        2 g 200 mL/hr over 30 Minutes Intravenous Every 8 hours 02/01/23 1613     02/01/23 1400  DAPTOmycin (CUBICIN) 800 mg in sodium chloride 0.9 % IVPB  Status:  Discontinued        750 mg 132 mL/hr over 30 Minutes Intravenous Daily 01/31/23 2159 02/01/23 1526   02/01/23 1330  azithromycin (ZITHROMAX) tablet 500 mg       Placed in "Followed by" Linked Group   500 mg Oral  Daily 02/01/23 1230 02/01/23 1422   02/01/23 1000  aztreonam (AZACTAM) 1 g in sodium chloride 0.9 % 100 mL IVPB  Status:  Discontinued        1 g 200 mL/hr over 30 Minutes Intravenous Every 8 hours 02/01/23 0905 02/01/23 1613   02/01/23 0100  ceFEPIme (MAXIPIME) 2 g in sodium chloride 0.9 % 100 mL IVPB  Status:  Discontinued        2 g 200 mL/hr over 30 Minutes Intravenous Every 8 hours 01/31/23 2201 02/01/23 0905   02/01/23 0045  hydroxychloroquine (PLAQUENIL) tablet 200 mg        200 mg Oral 2 times daily 01/31/23 1854     01/31/23 1700  vancomycin (VANCOCIN) IVPB 1000 mg/200 mL premix       Placed in "And" Linked Group   1,000 mg 200 mL/hr over 60 Minutes Intravenous  Once 01/31/23 1632 01/31/23 1846   01/31/23 1700  vancomycin (VANCOREADY) IVPB 1500 mg/300 mL       Placed in "And" Linked Group   1,500 mg 150 mL/hr over 120 Minutes Intravenous  Once 01/31/23 1632 01/31/23 2004   01/31/23 1630  ceFEPIme (MAXIPIME) 2 g in sodium chloride 0.9 % 100 mL IVPB        2 g 200 mL/hr over 30 Minutes Intravenous  Once 01/31/23 1626 01/31/23 1709       REVIEW OF SYSTEMS:  Const:  fever,  chills, negative weight loss Eyes: negative diplopia or visual changes, negative eye pain ENT: negative coryza, negative sore throat Resp: negative cough, hemoptysis, has dyspnea Cards: negative for chest pain, palpitations, lower extremity edema GU: negative for frequency, dysuria and hematuria GI: Negative for abdominal pain, diarrhea, bleeding, constipation Skin: negative for rash and pruritus Heme: negative for easy bruising and gum/nose bleeding MS:  muscle weakness Neurolo:negative for headaches, dizziness, vertigo, memory problems  Psych:  anxiety, depression  Endocrine:  thyroid, diabetes Allergy/Immunology- cephalosporin allergy and other antibiotics  Objective:  VITALS:  BP 108/67 (BP Location: Left Arm)   Pulse 76   Temp 98.5 F (36.9 C)   Resp 18   Ht 5\' 7"  (1.702 m)   Wt (!) 156.5  kg   LMP 04/20/2001   SpO2 98%   BMI 54.03 kg/m   PHYSICAL EXAM:  General: Alert, cooperative, chronically ill, increased BMI, nasal cannula oxygen.  Head: Normocephalic, without obvious abnormality, atraumatic. Eyes: Conjunctivae clear, anicteric sclerae. Pupils are equal ENT Nares normal. No drainage or sinus tenderness. Lips,  mucosa, and tongue normal. No Thrush Neck: Supple, symmetrical, no adenopathy, thyroid: non tender no carotid bruit and no JVD. Back: No CVA tenderness. Lungs: b/l air entry- crepts b/l Heart: Regular rate and rhythm, no murmur, rub or gallop. Abdomen: Soft, non-tender,not distended. Bowel sounds normal. No masses Extremities: left 4 th toe- mild erythema Healing wound superficial on the tip     Skin: No rashes or lesions. Or bruising Lymph: Cervical, supraclavicular normal. Neurologic: Grossly non-focal Pertinent Labs Lab Results CBC    Component Value Date/Time   WBC 2.2 (L) 02/02/2023 0551   RBC 3.90 02/02/2023 0551   HGB 8.6 (L) 02/02/2023 0551   HGB 12.1 05/27/2014 1439   HCT 29.4 (L) 02/02/2023 0551   HCT 37.5 05/27/2014 1439   PLT 181 02/02/2023 0551   PLT 221 05/27/2014 1439   MCV 75.4 (L) 02/02/2023 0551   MCV 80 05/27/2014 1439   MCH 22.1 (L) 02/02/2023 0551   MCHC 29.3 (L) 02/02/2023 0551   RDW 19.1 (H) 02/02/2023 0551   RDW 17.3 (H) 05/27/2014 1439   LYMPHSABS 0.7 02/02/2023 0551   LYMPHSABS 1.6 12/03/2013 0511   MONOABS 0.3 02/02/2023 0551   MONOABS 0.5 12/03/2013 0511   EOSABS 0.0 02/02/2023 0551   EOSABS 0.0 12/03/2013 0511   BASOSABS 0.0 02/02/2023 0551   BASOSABS 0.1 12/03/2013 0511       Latest Ref Rng & Units 02/02/2023    5:51 AM 02/01/2023    2:06 AM 01/31/2023    1:42 PM  CMP  Glucose 70 - 99 mg/dL 161  096  045   BUN 8 - 23 mg/dL 16  19  16    Creatinine 0.44 - 1.00 mg/dL 4.09  8.11  9.14   Sodium 135 - 145 mmol/L 137  137  136   Potassium 3.5 - 5.1 mmol/L 3.6  3.8  3.9   Chloride 98 - 111 mmol/L 109   106  105   CO2 22 - 32 mmol/L 22  21  19    Calcium 8.9 - 10.3 mg/dL 7.9  7.9  8.9   Total Protein 6.5 - 8.1 g/dL  6.6  7.7   Total Bilirubin 0.3 - 1.2 mg/dL  0.7  0.6   Alkaline Phos 38 - 126 U/L  64  72   AST 15 - 41 U/L  16  23   ALT 0 - 44 U/L  14  16       Microbiology: Recent Results (from the past 240 hour(s))  Culture, blood (Routine x 2)     Status: None (Preliminary result)   Collection Time: 01/31/23  1:45 PM   Specimen: BLOOD RIGHT ARM  Result Value Ref Range Status   Specimen Description BLOOD RIGHT ARM  Final   Special Requests   Final    BOTTLES DRAWN AEROBIC AND ANAEROBIC Blood Culture results may not be optimal due to an excessive volume of blood received in culture bottles   Culture   Final    NO GROWTH 2 DAYS Performed at [facility], [address] [place], [place] [address]    Report Status PENDING  Incomplete  Culture, blood (Routine x 2)     Status: None (Preliminary result)   Collection Time: 01/31/23  3:00 PM   Specimen: BLOOD LEFT ARM  Result Value Ref Range Status   Specimen Description BLOOD LEFT ARM  Final   Special Requests   Final    BOTTLES DRAWN AEROBIC AND ANAEROBIC Blood Culture adequate  volume   Culture   Final    NO GROWTH 2 DAYS Performed at [facility], [address] [place], [place] [address]    Report Status PENDING  Incomplete  SARS Coronavirus 2 by RT PCR (hospital order, performed in [facility] hospital lab) *cepheid single result test* Anterior Nasal Swab     Status: None   Collection Time: 01/31/23  3:35 PM   Specimen: Anterior Nasal Swab  Result Value Ref Range Status   SARS Coronavirus 2 by RT PCR NEGATIVE NEGATIVE Final    Comment: (NOTE) SARS-CoV-2 target nucleic acids are NOT DETECTED.  The SARS-CoV-2 RNA is generally detectable in upper and lower respiratory specimens during the acute phase of infection. The lowest concentration of SARS-CoV-2 viral copies this assay can detect is 250 copies /  mL. A negative result does not preclude SARS-CoV-2 infection and should not be used as the sole basis for treatment or other patient management decisions.  A negative result may occur with improper specimen collection / handling, submission of specimen other than nasopharyngeal swab, presence of viral mutation(s) within the areas targeted by this assay, and inadequate number of viral copies (<250 copies / mL). A negative result must be combined with clinical observations, patient history, and epidemiological information.  Fact Sheet for Patients:   RoadLapTop.co.za  Fact Sheet for Healthcare Providers: http://kim-miller.com/  This test is not yet approved or  cleared by the [place] FDA and has been authorized for detection and/or diagnosis of SARS-CoV-2 by FDA under an Emergency Use Authorization (EUA).  This EUA will remain in effect (meaning this test can be used) for the duration of the COVID-19 declaration under Section 564(b)(1) of the Act, 21 U.S.C. section 360bbb-3(b)(1), unless the authorization is terminated or revoked sooner.  Performed at [facility], [address] [place], [place] [address]   Urine Culture (for pregnant, neutropenic or urologic patients or patients with an indwelling urinary catheter)     Status: None   Collection Time: 02/01/23  1:05 AM   Specimen: Urine, Clean Catch  Result Value Ref Range Status   Specimen Description   Final    URINE, CLEAN CATCH Performed at [facility], [address] [place], [place] [address]    Special Requests   Final    Normal Performed at [facility], [address] [place], [place] [address]    Culture   Final    NO GROWTH Performed at [facility], [address], [place], [place] [address]    Report Status 02/02/2023 FINAL  Final  MRSA Next Gen by PCR, Nasal     Status: None   Collection Time: 02/01/23  6:10 AM    Specimen: Nasal Mucosa; Nasal Swab  Result Value Ref Range Status   MRSA by PCR Next Gen NOT DETECTED NOT DETECTED Final    Comment: (NOTE) The GeneXpert MRSA Assay (FDA approved for NASAL specimens only), is one component of a comprehensive MRSA colonization surveillance program. It is not intended to diagnose MRSA infection nor to guide or monitor treatment for MRSA infections. Test performance is not FDA approved in patients less than 12 years old. Performed at [facility], [address] [place], [place] [address]     IMAGING RESULTS: Cxr b/l infiltrate I have personally reviewed the films ? Impression/Recommendation Fever with b/l infiltrate-  R/o viral pneumonia R/o daptomycin induced Eosinophilic pneumonia Pt is currently on aztreonam, azithromycin and vanco  Left 4 th  toe osteo- due to MRSA_ healing -has been on daptomycin since 9/23 for 6 weeks until 11/3 Hold dapto Continue vanco  Multiple antibiotic allergies has cephalosporin allergies But has tolerated amoxicillin  Pot inflammatory pulmonary fibrosis - post covid?on home oxygen  H/o DFI Multiple toe amputation in the past  H/o MRSA bacteremia ? _pt is followed by [facility] ID ________________________________ Discussed with patient, requesting provider

## 2023-02-02 NOTE — Progress Notes (Signed)
[facility]  - [facility]   PATIENT NAME: [name] [name]    MR#:  [id number]  DATE OF BIRTH:  Sep 13, 1960  SUBJECTIVE:   Patient overall feels a bit better today. No family at bedside. Currently on 6 L high flow nasal cannula oxygen.   VITALS:  Blood pressure (!) 113/55, pulse 88, temperature 97.7 F (36.5 C), temperature source Oral, resp. rate 20, height 5\' 7"  (1.702 m), weight (!) 156.5 kg, last menstrual period 04/20/2001, SpO2 99%.  PHYSICAL EXAMINATION:   GENERAL:  62 y.o.-year-old patient with no acute distress. Morbid obesity LUNGS: decreased breath sounds bilaterally, no wheezing CARDIOVASCULAR: S1, S2 normal. No murmur   ABDOMEN: Soft, nontender, nondistended. Bowel sounds present.  EXTREMITIES: +  edema b/l.    NEUROLOGIC: nonfocal  patient is alert and awake  LABORATORY PANEL:  CBC Recent Labs  Lab 02/02/23 0551  WBC 2.2*  HGB 8.6*  HCT 29.4*  PLT 181    Chemistries  Recent Labs  Lab 02/01/23 0206 02/02/23 0551  NA 137 137  K 3.8 3.6  CL 106 109  CO2 21* 22  GLUCOSE 179* 111*  BUN 19 16  CREATININE 1.54* 1.24*  CALCIUM 7.9* 7.9*  AST 16  --   ALT 14  --   ALKPHOS 64  --   BILITOT 0.7  --    Cardiac Enzymes No results for input(s): "TROPONINI" in the last 168 hours. RADIOLOGY:  DG Chest Port 1 View  Result Date: 02/01/2023 CLINICAL DATA:  [id number] Acute respiratory failure with hypoxia (HCC) [id number]. Fever, hypoxia. EXAM: PORTABLE CHEST 1 VIEW COMPARISON:  Chest radiograph 01/31/2023. FINDINGS: Right upper extremity PICC tip projects over the right atrium. Slightly increased bilateral reticular opacities, right-greater-than-left, suspicious for multifocal infection. Possible small right pleural effusion. Stable cardiac and mediastinal contours. No pneumothorax. IMPRESSION: Slightly increased bilateral reticular opacities, right-greater-than-left, suspicious for multifocal infection. Electronically Signed   By: Orvan Falconer M.D.   On: 02/01/2023 12:00   CT ABDOMEN PELVIS W CONTRAST  Result Date: 01/31/2023 CLINICAL DATA:  Diarrhea. EXAM: CT ABDOMEN AND PELVIS WITH CONTRAST TECHNIQUE: Multidetector CT imaging of the abdomen and pelvis was performed using the standard protocol following bolus administration of intravenous contrast. RADIATION DOSE REDUCTION: This exam was performed according to the departmental dose-optimization program which includes automated exposure control, adjustment of the mA and/or kV according to patient size and/or use of iterative reconstruction technique. CONTRAST:  OMNIPAQUE IOHEXOL 300 MG/ML  SOLN COMPARISON:  February 28, 2022. FINDINGS: Lower chest: No acute abnormality. Hepatobiliary: No focal liver abnormality is seen. Status post cholecystectomy. No biliary dilatation. Pancreas: Unremarkable. No pancreatic ductal dilatation or surrounding inflammatory changes. Spleen: Normal in size without focal abnormality. Adrenals/Urinary Tract: Adrenal glands are unremarkable. Kidneys are normal, without renal calculi, focal lesion, or hydronephrosis. Bladder is unremarkable. Stomach/Bowel: Stomach is within normal limits. Appendix appears normal. No evidence of bowel wall thickening, distention, or inflammatory changes. Vascular/Lymphatic: No significant vascular findings are present. No enlarged abdominal or pelvic lymph nodes. Reproductive: Status post hysterectomy. No adnexal masses. Other: No ascites is noted. Stable moderate size ventral hernia is noted containing fat fluid. Musculoskeletal: No acute or significant osseous findings. IMPRESSION: Stable mild size ventral hernia containing fat and fluid. No acute abnormality seen in the abdomen or pelvis. Electronically Signed   By: Lupita Raider M.D.   On: 01/31/2023 19:40    Assessment and Plan  62 year old female past medical history of chronic diastolic heart failure,  type 2 diabetes mellitus, GERD, hypertension, hypothyroidism,  irritable bowel, depression, rheumatoid arthritis, obesity, MRSA infection.  Was recently at [facility] and prescribed daptomycin IV to go home with for MRSA infection of the foot.   Patient presenting with fever.  Started on empiric antibiotics.  Blood cultures negative.   10/15.  Now on high flow nasal cannula, chronically wears 4 L).  Patient little lethargic.  ABG does not show CO2 retention.  So far blood cultures are negative.  1 dose of Lasix given and fluids held. 10/16: Assuming care for pt. Patient says breathing has settled down. Case discussed with ID Dr. Joylene Draft to follow patient and also pulmonary Dr. Marcos Eke to follow. CT chest without contrast ordered     Assessment and Plan: Severe sepsis (HCC) Present on admission with fever, tachypnea, positive urine analysis, multifocal pneumonia on chest x-ray, previous treatment of MRSA with daptomycin.   -- Patient currently on vancomycin, Aztreonam and zithromax  Acute on chronic diastolic CHF (congestive heart failure) (HCC) --Stopped IV fluids this morning given 1 dose of Lasix because of worsening respiratory status overnight.   Acute respiratory failure with hypoxia (HCC) --Patient now on 86 L high flow nasal cannula with good saturations.  Chronically wears 4 L.  Did have a pulse ox overnight of 88% on 4 L.   Multifocal pneumonia --discontinued daptomcyin due to concern for pneumonia related to it. -- Infectious disease consultation with Dr. Joylene Draft. Recommends vancomycin for now. - --Pulmonary consultation with Dr. Marcos Eke. Recommend CT scan of the lungs  -Acute UTI UC--no growth   Chronic respiratory failure with hypoxia (HCC) Chronically on 4 L     Pulmonary fibrosis (HCC) With chronic respiratory failure on 4 L chronically.   Abdominal pain Improved.  CT scan negative   Diabetic ulcer of left foot (HCC) Continue vancomcin. Pt was on IV daptomysin thru [facility] (recently)   Nausea & vomiting Hold IV fluids    COPD (chronic obstructive pulmonary disease) (HCC) Continue nebulizer treatments   HLD (hyperlipidemia) Hold Lipitor while on atorvastatin   Essential (primary) hypertension Hold blood pressure medication     PAF (paroxysmal atrial fibrillation) (HCC) Continue patient on Eliquis.   Depression with anxiety Continue Seroquel.   Hypothyroidism Continue levothyroxine.   Rheumatoid arthritis involving multiple sites with positive rheumatoid factor (HCC)   Anemia Last hemoglobin 9.4 will check a ferritin tomorrow.   Morbid obesity with BMI of 50.0-59.9, adult (HCC) BMI 54.03   CKD stage 3b, GFR 30-44 ml/min (HCC) Continue to monitor during the hospital course.      Procedures: Family communication :none today Consults : infectious disease, pulmonary CODE STATUS: full DVT Prophylaxis : eliquis Level of care: Progressive Status is: Inpatient Remains inpatient appropriate because: acute respiratory failure multifocal pneumonia    TOTAL TIME TAKING CARE OF THIS PATIENT: 35 minutes.  >50% time spent on counselling and coordination of care  Note: This dictation was prepared with Dragon dictation along with smaller phrase technology. Any transcriptional errors that result from this process are unintentional.  Enedina Finner M.D    [facility]   CC: Primary care physician; Etheleen Nicks, NP

## 2023-02-03 DIAGNOSIS — A4902 Methicillin resistant Staphylococcus aureus infection, unspecified site: Secondary | ICD-10-CM

## 2023-02-03 DIAGNOSIS — M86172 Other acute osteomyelitis, left ankle and foot: Secondary | ICD-10-CM | POA: Diagnosis not present

## 2023-02-03 DIAGNOSIS — J8410 Pulmonary fibrosis, unspecified: Secondary | ICD-10-CM | POA: Diagnosis not present

## 2023-02-03 DIAGNOSIS — L089 Local infection of the skin and subcutaneous tissue, unspecified: Secondary | ICD-10-CM | POA: Diagnosis not present

## 2023-02-03 DIAGNOSIS — A419 Sepsis, unspecified organism: Secondary | ICD-10-CM | POA: Diagnosis not present

## 2023-02-03 DIAGNOSIS — R6520 Severe sepsis without septic shock: Secondary | ICD-10-CM | POA: Diagnosis not present

## 2023-02-03 LAB — CBC WITH DIFFERENTIAL/PLATELET
Abs Immature Granulocytes: 0.02 10*3/uL (ref 0.00–0.07)
Basophils Absolute: 0 10*3/uL (ref 0.0–0.1)
Basophils Relative: 1 %
Eosinophils Absolute: 0 10*3/uL (ref 0.0–0.5)
Eosinophils Relative: 0 %
HCT: 29.8 % — ABNORMAL LOW (ref 36.0–46.0)
Hemoglobin: 8.7 g/dL — ABNORMAL LOW (ref 12.0–15.0)
Immature Granulocytes: 1 %
Lymphocytes Relative: 24 %
Lymphs Abs: 0.6 10*3/uL — ABNORMAL LOW (ref 0.7–4.0)
MCH: 22.3 pg — ABNORMAL LOW (ref 26.0–34.0)
MCHC: 29.2 g/dL — ABNORMAL LOW (ref 30.0–36.0)
MCV: 76.4 fL — ABNORMAL LOW (ref 80.0–100.0)
Monocytes Absolute: 0.3 10*3/uL (ref 0.1–1.0)
Monocytes Relative: 10 %
Neutro Abs: 1.7 10*3/uL (ref 1.7–7.7)
Neutrophils Relative %: 64 %
Platelets: 189 10*3/uL (ref 150–400)
RBC: 3.9 MIL/uL (ref 3.87–5.11)
RDW: 18.6 % — ABNORMAL HIGH (ref 11.5–15.5)
WBC: 2.6 10*3/uL — ABNORMAL LOW (ref 4.0–10.5)
nRBC: 0 % (ref 0.0–0.2)

## 2023-02-03 LAB — GLUCOSE, CAPILLARY
Glucose-Capillary: 123 mg/dL — ABNORMAL HIGH (ref 70–99)
Glucose-Capillary: 154 mg/dL — ABNORMAL HIGH (ref 70–99)

## 2023-02-03 LAB — CREATININE, SERUM
Creatinine, Ser: 0.99 mg/dL (ref 0.44–1.00)
GFR, Estimated: >60 mL/min (ref >60)

## 2023-02-03 MED ORDER — ATORVASTATIN CALCIUM 80 MG PO TABS
80.0000 mg | ORAL_TABLET | Freq: Every day | ORAL | Status: DC
  Administered 2023-02-03 – 2023-02-04 (×2): 80 mg via ORAL
  Filled 2023-02-03 (×2): qty 1

## 2023-02-03 MED ORDER — CALCIUM CITRATE 950 (200 CA) MG PO TABS
200.0000 mg | ORAL_TABLET | Freq: Every day | ORAL | Status: DC
  Administered 2023-02-03 – 2023-02-04 (×2): 200 mg via ORAL
  Filled 2023-02-03 (×2): qty 1

## 2023-02-03 MED ORDER — FAMOTIDINE 20 MG PO TABS
20.0000 mg | ORAL_TABLET | Freq: Every day | ORAL | Status: DC
  Administered 2023-02-03 – 2023-02-04 (×2): 20 mg via ORAL
  Filled 2023-02-03 (×2): qty 1

## 2023-02-03 MED ORDER — VITAMIN D3 25 MCG (1000 UNIT) PO TABS
1000.0000 [IU] | ORAL_TABLET | Freq: Every day | ORAL | Status: DC
  Administered 2023-02-03 – 2023-02-04 (×2): 1000 [IU] via ORAL
  Filled 2023-02-03 (×4): qty 1

## 2023-02-03 MED ORDER — VANCOMYCIN IV (FOR PTA / DISCHARGE USE ONLY): 2000.0000 mg | INTRAVENOUS | 0 refills | Status: AC

## 2023-02-03 MED ORDER — INSULIN ASPART 100 UNIT/ML IJ SOLN
0.0000 [IU] | Freq: Three times a day (TID) | INTRAMUSCULAR | Status: DC
  Administered 2023-02-03: 1 [IU] via SUBCUTANEOUS
  Filled 2023-02-03: qty 1

## 2023-02-03 MED ORDER — METOCLOPRAMIDE HCL 10 MG/10ML PO SOLN
5.0000 mg | Freq: Three times a day (TID) | ORAL | Status: DC
  Administered 2023-02-03 – 2023-02-04 (×3): 5 mg via ORAL
  Filled 2023-02-03 (×5): qty 5

## 2023-02-03 MED ORDER — INSULIN ASPART 100 UNIT/ML IJ SOLN: 0.0000 [IU] | Freq: Every day | INTRAMUSCULAR | Status: DC

## 2023-02-03 MED ORDER — METOPROLOL TARTRATE 25 MG PO TABS
25.0000 mg | ORAL_TABLET | Freq: Two times a day (BID) | ORAL | Status: DC
  Administered 2023-02-03 – 2023-02-04 (×2): 25 mg via ORAL
  Filled 2023-02-03 (×2): qty 1

## 2023-02-03 MED ORDER — LINAGLIPTIN 5 MG PO TABS
5.0000 mg | ORAL_TABLET | Freq: Every day | ORAL | Status: DC
  Administered 2023-02-03 – 2023-02-04 (×2): 5 mg via ORAL
  Filled 2023-02-03 (×2): qty 1

## 2023-02-03 MED ORDER — PREGABALIN 50 MG PO CAPS
100.0000 mg | ORAL_CAPSULE | Freq: Three times a day (TID) | ORAL | Status: DC
  Administered 2023-02-03 – 2023-02-04 (×3): 100 mg via ORAL
  Filled 2023-02-03 (×3): qty 2

## 2023-02-03 MED ORDER — PRAMIPEXOLE DIHYDROCHLORIDE 0.25 MG PO TABS
0.5000 mg | ORAL_TABLET | Freq: Every day | ORAL | Status: DC
  Administered 2023-02-03: 0.5 mg via ORAL
  Filled 2023-02-03: qty 2

## 2023-02-03 MED ORDER — METFORMIN HCL ER 500 MG PO TB24
500.0000 mg | ORAL_TABLET | Freq: Every morning | ORAL | Status: DC
  Administered 2023-02-04: 500 mg via ORAL
  Filled 2023-02-03: qty 1

## 2023-02-03 MED ORDER — CALCIUM CITRATE-VITAMIN D3 315-6.25 MG-MCG PO TABS: 1.0000 | ORAL_TABLET | Freq: Every day | ORAL | Status: DC

## 2023-02-03 NOTE — Progress Notes (Addendum)
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  Patient was last seen at [facility] pulmonary on 18 May 2022 at that time she was still participating in pulmonary rehab.  She was admitted to [facility] on 23 September with MRSA infection and osteomyelitis on the left fourth toe.  She was discharged on 19 January 2023 from that admission.  She was started on daptomycin at the recommendation of ID at [facility] and the recommendation was for 6 weeks of daptomycin via home infusion.  Patient however was admitted to [facility] on 14 October after she developed fever and encephalopathy and was noted to have bilateral patchy pulmonary infiltrates on chest x-ray.  The patient also noted some increased shortness of breath and was requiring oxygen at 4 L/min which is over her 3 L/min via nasal cannula at home.  Upon readmission to [facility] the patient was switched to vancomycin given the fever encephalopathy and pulmonary infiltrates.  We are asked to render opinion on these issues.  On my evaluation of the patient today she appears to be in no distress comfortably resting in bed.  She is on nasal cannula O2 with no air hunger evident.  She states that she is starting to feel better and her dyspnea is markedly improved.  She has not had any cough or sputum production.  No increased lower extremity edema over her baseline.  Pertinent  Medical History   Patient Active Problem List   Diagnosis Date Noted   Diabetic  infection of left foot (HCC) 02/02/2023   Community acquired pneumonia 02/02/2023   Acute respiratory failure with hypoxia (HCC) 02/01/2023   Multifocal pneumonia 02/01/2023   Acute on chronic diastolic CHF (congestive heart failure) (HCC) 02/01/2023   Acute UTI 01/31/2023   MRSA infection 01/13/2023   Gastroparesis 02/28/2022   Severe sepsis (HCC) 01/04/2022   Depression with anxiety    PAF (paroxysmal atrial fibrillation) (HCC)    Pulmonary fibrosis (HCC) 02/07/2020   Chronic respiratory failure with hypoxia (HCC) 02/07/2020   Anemia 02/07/2020   Morbid obesity with BMI of 50.0-59.9, adult (HCC) 07/08/2019   CKD stage 3b, GFR 30-44 ml/min (HCC) 04/06/2019   HLD (hyperlipidemia) 04/06/2019   Abdominal pain 02/12/2019   Diabetic ulcer of left foot (HCC) 02/14/2018   Chronic pain syndrome 03/31/2016   Osteoarthritis, multiple sites 12/24/2015   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[employer]) 12/23/2015   Long term prescription opiate use 12/23/2015   Diabetic peripheral neuropathy (HCC) 12/23/2015   COPD (chronic obstructive pulmonary disease) (HCC) 01/03/2014   Bipolar disorder, unspecified (HCC) 01/03/2014   Borderline personality disorder (HCC) 01/06/2012   Nausea & vomiting 08/04/2011   Hypothyroidism 06/28/2010   Rheumatoid arthritis involving multiple sites with positive rheumatoid factor (HCC) 06/28/2010   Essential (primary) hypertension 01/27/2005   Major depressive disorder, recurrent episode, moderate (HCC) 06/03/2004    Significant Hospital Events: Including procedures, antibiotic start and stop dates in addition to other pertinent events   01/31/2023 admitted with encephalopathy, fever and bilateral pulmonary infiltrates, daptomycin switched to vancomycin  02/02/2023 CT chest shows dramatic improvement in bilateral pulmonary infiltrates, residual from prior postinflammatory pulmonary fibrosis 02/03/2023 complains of her issues with gastroparesis  requesting Reglan  Interim History / Subjective:  Patient states that he is feeling better from admission.  Having difficulty today due to issues with gastroparesis.  Shortness of breath markedly improved.  Objective   Blood pressure 114/67, pulse 72, temperature 98.4 F (36.9 C), temperature source Oral, resp. rate 18, height 5\' 7"  (1.702 m), weight (!) 156.5 kg, last menstrual period 04/20/2001, SpO2 100%.    SpO2: 100 % O2 Flow Rate (L/min): 4 L/min FiO2 (%): 36 %    Intake/Output Summary (Last 24 hours) at 02/03/2023 1727 Last data filed at 02/03/2023 0759 Gross per 24 hour  Intake 0 ml  Output 1300 ml  Net -1300 ml   Filed Weights   01/31/23 1329  Weight: (!) 156.5 kg    Examination: GENERAL: Morbidly obese woman, laying comfortably in bed, no respiratory distress.  Comfortable with nasal cannula O2 oxygen on.  No conversational dyspnea. HEAD: Normocephalic, atraumatic. EYES: Pupils equal, round, reactive to light.  No scleral icterus. MOUTH: Edentulous, oral mucosa moist.  No thrush. NECK: Supple. No thyromegaly. Trachea midline. No JVD.  No adenopathy. PULMONARY: Good air entry bilaterally.  Coarse, without wheezes or rhonchi.   CARDIOVASCULAR: S1 and S2. Regular rate and rhythm.  Grade 1/6 to 2/6 systolic ejection murmur left sternal border. ABDOMEN: Obese, soft, nondistended.  No tenderness noted. MUSCULOSKELETAL: RA changes both hands.  No clubbing or edema noted, no increased warmth in the lower extremities. Chronic stasis changes but no overt edema. NEUROLOGIC: No overt focal deficit, speech is fluent. SKIN: Intact,warm,dry.  Chronic stasis changes lower extremities.   PSYCH: Mood and behavior normal.    Assessment & Plan:  Bilateral pulmonary infiltrates in the setting of daptomycin therapy Dramatic improvement in pulmonary infiltrates off of daptomycin Suspect reaction to daptomycin now improved off of the medication Recommend not restarting daptomycin No  need for systemic steroids at this point  MRSA infection/osteomyelitis ID following Patient will be treated with IV vancomycin  Postinflammatory pulmonary fibrosis Asthmatic bronchitis Acute/chronic respiratory failure with hypoxia Wean O2 as tolerated for sats of 90 to 92% Appears to be well compensated Continue DuoNeb as needed Continue Pulmicort twice a day until discharge Resume Breztri upon discharge 2 puffs twice a day  Diabetes mellitus type 2 in poor control Gastroparesis Management per hospitalist team Aggressive glycemic control will help with healing Reglan per hospitalist team  Acute toxic metabolic encephalopathy Resolved Was likely due to hypoxia, acute illness   Labs   CBC: Recent Labs  Lab 01/31/23 1342 02/01/23 0206 02/02/23 0551 02/03/23 1520  WBC 9.9 7.7 2.2* 2.6*  NEUTROABS 9.3*  --  1.1* 1.7  HGB 10.3* 9.4* 8.6* 8.7*  HCT 35.1* 31.7* 29.4* 29.8*  MCV 75.5* 76.0* 75.4* 76.4*  PLT 285 204 181 189    Basic Metabolic Panel: Recent Labs  Lab 01/31/23 1342 02/01/23 0206 02/02/23 0551 02/03/23 0615  NA 136 137 137  --   K 3.9 3.8 3.6  --   CL 105 106 109  --   CO2 19* 21* 22  --   GLUCOSE 266* 179* 111*  --   BUN 16 19 16   --   CREATININE 1.35* 1.54* 1.24* 0.99  CALCIUM 8.9 7.9* 7.9*  --    GFR: Estimated Creatinine Clearance: 93.8 mL/min (by C-G formula based on SCr of 0.99 mg/dL). Recent Labs  Lab  01/31/23 1342 01/31/23 1635 02/01/23 0206 02/02/23 0551 02/03/23 1520  PROCALCITON 0.53  --   --  6.71  --   WBC 9.9  --  7.7 2.2* 2.6*  LATICACIDVEN 3.5* 1.5  --   --   --     Liver Function Tests: Recent Labs  Lab 01/31/23 1342 02/01/23 0206  AST 23 16  ALT 16 14  ALKPHOS 72 64  BILITOT 0.6 0.7  PROT 7.7 6.6  ALBUMIN 3.4* 3.0*   No results for input(s): "LIPASE", "AMYLASE" in the last 168 hours. No results for input(s): "AMMONIA" in the last 168 hours.  ABG    Component Value Date/Time   PHART 7.33 (L) 02/01/2023  0750   PCO2ART 42 02/01/2023 0750   PO2ART 80 (L) 02/01/2023 0750   HCO3 22.1 02/01/2023 0750   ACIDBASEDEF 3.7 (H) 02/01/2023 0750   O2SAT 97.4 02/01/2023 0750     Coagulation Profile: Recent Labs  Lab 01/31/23 1342  INR 1.6*    Cardiac Enzymes: Recent Labs  Lab 02/01/23 0640  CKTOTAL 184    HbA1C: Hemoglobin A1C  Date/Time Value Ref Range Status  12/02/2013 08:36 AM 13.1 (H) 4.2 - 6.3 % Final    Comment:    The American Diabetes Association recommends that a primary goal of therapy should be <7% and that physicians should reevaluate the treatment regimen in patients with HbA1c values consistently >8%.   06/26/2013 04:07 AM 11.3 (H) 4.2 - 6.3 % Final    Comment:    The American Diabetes Association recommends that a primary goal of therapy should be <7% and that physicians should reevaluate the treatment regimen in patients with HbA1c values consistently >8%.    Hgb A1c MFr Bld  Date/Time Value Ref Range Status  01/04/2022 02:31 AM 6.8 (H) 4.8 - 5.6 % Final    Comment:    (NOTE) Pre diabetes:          5.7%-6.4%  Diabetes:              >6.4%  Glycemic control for   <7.0% adults with diabetes   01/10/2020 05:41 AM 10.8 (H) 4.8 - 5.6 % Final    Comment:    (NOTE) Pre diabetes:          5.7%-6.4%  Diabetes:              >6.4%  Glycemic control for   <7.0% adults with diabetes     CBG: Recent Labs  Lab 01/31/23 1332 02/01/23 0029 02/03/23 1607  GLUCAP 224* 144* 123*    Review of Systems:   A 10 point review of systems was performed and it is as noted above otherwise negative.  Allergies Allergies  Allergen Reactions   Cefepime Anaphylaxis    lips swell and the break out in blisters.   Ceftriaxone Anaphylaxis    Angioedema while admitted at [facility] 01/12/2023 Tolerated amoxicillin    Cephalexin Hives   Codeine Palpitations, Nausea Only, Nausea And Vomiting, Rash and Shortness Of Breath    "makes heart fly, she gets flushed and passes out"    Doxycycline Rash   Propoxyphene Rash and Shortness Of Breath    Increase heart rate   Sulfa Antibiotics Palpitations, Nausea Only, Shortness Of Breath and Hives    "makes heart fly, she gets flushed and passes out"   Clindamycin Rash    Tongue swelling, oral sores, and Mouth rash   Lovenox [Enoxaparin Sodium] Hives   Hydrocodone Nausea And Vomiting  Hear racing & breaks out into a cold sweat.   Meropenem Rash    Erythematous, hot, pruritic rash over arms, chest, back, abdomen, and face occurred at the end of meropenem infusion on 02/22/18     Home Medications  Prior to Admission medications   Medication Sig Start Date End Date Taking? Authorizing Provider  pramipexole (MIRAPEX) 0.125 MG tablet Take 0.25 mg by mouth at bedtime. 12/17/22  Yes [provider]  acetaminophen (TYLENOL) 325 MG tablet Take 2 tablets (650 mg total) by mouth every 6 (six) hours as needed for mild pain or moderate pain (or Fever >/= 101). 01/15/20   Marguerita Merles Latif, DO  amLODipine (NORVASC) 10 MG tablet Take 10 mg by mouth daily. 12/10/21   [provider]  ARIPiprazole (ABILIFY) 5 MG tablet Take 1 tablet (5 mg total) by mouth daily. 08/03/22   Clapacs, Jackquline Denmark, MD  ascorbic acid (VITAMIN C) 500 MG tablet Take 1 tablet (500 mg total) by mouth daily. 05/05/19   Rolly Salter, MD  atorvastatin (LIPITOR) 80 MG tablet Take 80 mg by mouth daily. 12/10/21   [provider]  Budeson-Glycopyrrol-Formoterol (BREZTRI AEROSPHERE) 160-9-4.8 MCG/ACT AERO Inhale 2 puffs into the lungs in the morning and at bedtime. 10/01/21   Salena Saner, MD  busPIRone (BUSPAR) 10 MG tablet Take 1 tablet (10 mg total) by mouth 2 (two) times daily. 08/03/22   Clapacs, Jackquline Denmark, MD  Calcium Citrate-Vitamin D3 (GNP CALCIUM CITRATE+D MAXIMUM) 315-6.25 MG-MCG TABS Take 1 tablet by mouth daily. 12/10/21   [provider]  Cholecalciferol (VITAMIN D) 125 MCG (5000 UT) CAPS Take 1 capsule by mouth daily. 11/29/19    [provider]  ELIQUIS 5 MG TABS tablet TAKE 1 TABLET BY MOUTH TWICE DAILY 06/26/21   Salena Saner, MD  famotidine (PEPCID) 20 MG tablet Take 1 tablet (20 mg total) by mouth daily. 05/05/19   Rolly Salter, MD  FLUoxetine (PROZAC) 40 MG capsule Take 1 capsule (40 mg total) by mouth daily. 08/03/22 01/30/23  Clapacs, Jackquline Denmark, MD  folic acid (FOLVITE) 1 MG tablet Take 1 mg by mouth daily.     [provider]  hydroxychloroquine (PLAQUENIL) 200 MG tablet Take 200 mg by mouth 2 (two) times daily. 12/10/21   [provider]  levothyroxine (SYNTHROID, LEVOTHROID) 88 MCG tablet Take 88 mcg by mouth daily before breakfast.    [provider]  metFORMIN (GLUCOPHAGE-XR) 500 MG 24 hr tablet Take 500 mg by mouth daily. 12/31/19   [provider]  methotrexate (RHEUMATREX) 2.5 MG tablet Take 2.5 mg by mouth once a week.    [provider]  metoCLOPramide (REGLAN) 5 MG/5ML solution Take 5 mg by mouth 4 (four) times daily -  before meals and at bedtime. 12/20/21   [provider]  metoprolol tartrate (LOPRESSOR) 25 MG tablet Take 25 mg by mouth 2 (two) times daily. 10/10/20   [provider]  pramipexole (MIRAPEX) 0.5 MG tablet Take 0.5 mg by mouth at bedtime. 03/23/22   [provider]  pregabalin (LYRICA) 100 MG capsule Take 1 capsule (100 mg total) by mouth 3 (three) times daily. 05/20/22   Edward Jolly, MD  promethazine (PHENERGAN) 12.5 MG tablet Take 12.5 mg by mouth. 02/26/22   [provider]  QUEtiapine (SEROQUEL) 300 MG tablet TAKE ONE TABLET BY MOUTH AT BEDTIME. 08/03/22   Clapacs, Jackquline Denmark, MD  TRADJENTA 5 MG TABS tablet Take 5 mg by mouth daily.  12/31/19   [provider]  zinc sulfate 220 (50 Zn) MG capsule Take 220 mg by mouth at bedtime.  12/31/19   [provider]  zolpidem (AMBIEN) 5 MG tablet Take 1 tablet (5 mg total) by mouth at bedtime. 08/03/22   Clapacs, Jackquline Denmark, MD    Scheduled Meds:   apixaban  5 mg Oral BID   ARIPiprazole  5 mg Oral Daily   atorvastatin  80 mg Oral Daily   budesonide  0.5 mg Nebulization BID   busPIRone  10 mg Oral BID   calcium citrate  200 mg of elemental calcium Oral Daily   Chlorhexidine Gluconate Cloth  6 each Topical Daily   cholecalciferol  1,000 Units Oral Daily   famotidine  20 mg Oral Daily   FLUoxetine  40 mg Oral Daily   hydroxychloroquine  200 mg Oral BID   influenza vac split trivalent PF  0.5 mL Intramuscular Tomorrow-1000   insulin aspart  0-5 Units Subcutaneous QHS   insulin aspart  0-9 Units Subcutaneous TID WC   levothyroxine  88 mcg Oral Q0600   linagliptin  5 mg Oral Daily   [START ON 02/04/2023] metFORMIN  500 mg Oral q AM   metoCLOPramide  5 mg Oral TID AC & HS   metoprolol tartrate  25 mg Oral BID   pantoprazole (PROTONIX) IV  40 mg Intravenous Q12H   pramipexole  0.5 mg Oral QHS   pregabalin  100 mg Oral TID   QUEtiapine  300 mg Oral QHS   sodium chloride flush  3 mL Intravenous Q12H   Continuous Infusions:  promethazine (PHENERGAN) injection (IM or IVPB) 12.5 mg (02/03/23 1417)   vancomycin 2,000 mg (02/03/23 0848)   PRN Meds:.acetaminophen **OR** acetaminophen, ipratropium-albuterol, ondansetron (ZOFRAN) IV, mouth rinse, promethazine (PHENERGAN) injection (IM or IVPB)   Level 2 F/U    Discussed with [name] [name].  Continue weaning off O2.  PCCM will sign off please reconsult as needed.  [name] Shelter, MD Advanced Bronchoscopy PCCM [facility]    *This note was dictated using voice recognition software/Dragon.  Despite best efforts to proofread, errors can occur which can change the meaning. Any transcriptional errors that result from this process are unintentional and may not be fully corrected at the time of dictation.

## 2023-02-03 NOTE — Plan of Care (Signed)

## 2023-02-03 NOTE — Telephone Encounter (Signed)
Noted.  Will close encounter.

## 2023-02-03 NOTE — Progress Notes (Signed)
Date of Admission:  01/31/2023     ID: [name] [name] is a 62 y.o. female  Principal Problem:   Severe sepsis (HCC) Active Problems:   Nausea & vomiting   COPD (chronic obstructive pulmonary disease) (HCC)   Essential (primary) hypertension   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[employer])   Diabetic peripheral neuropathy (HCC)   Hypothyroidism   Diabetic ulcer of left foot (HCC)   Rheumatoid arthritis involving multiple sites with positive rheumatoid factor (HCC)   Abdominal pain   CKD stage 3b, GFR 30-44 ml/min (HCC)   HLD (hyperlipidemia)   Morbid obesity with BMI of 50.0-59.9, adult (HCC)   Pulmonary fibrosis (HCC)   Chronic respiratory failure with hypoxia (HCC)   Anemia   Depression with anxiety   PAF (paroxysmal atrial fibrillation) (HCC)   Gastroparesis   MRSA infection   Acute UTI   Acute respiratory failure with hypoxia (HCC)   Multifocal pneumonia   Acute on chronic diastolic CHF (congestive heart failure) (HCC)   Diabetic infection of left foot (HCC)   Community acquired pneumonia    Subjective: Pt has nausea Says it is her gastroparesis No fever No SOB  Medications:   apixaban  5 mg Oral BID   ARIPiprazole  5 mg Oral Daily   atorvastatin  80 mg Oral Daily   budesonide  0.5 mg Nebulization BID   busPIRone  10 mg Oral BID   calcium citrate  200 mg of elemental calcium Oral Daily   Chlorhexidine Gluconate Cloth  6 each Topical Daily   cholecalciferol  1,000 Units Oral Daily   famotidine  20 mg Oral Daily   FLUoxetine  40 mg Oral Daily   hydroxychloroquine  200 mg Oral BID   influenza vac split trivalent PF  0.5 mL Intramuscular Tomorrow-1000   insulin aspart  0-5 Units Subcutaneous QHS   insulin aspart  0-9 Units Subcutaneous TID WC   levothyroxine  88 mcg Oral Q0600   linagliptin  5 mg Oral Daily   [START ON 02/04/2023] metFORMIN  500 mg Oral q AM   metoprolol tartrate  25 mg Oral BID   pantoprazole (PROTONIX) IV  40 mg  Intravenous Q12H   pramipexole  0.5 mg Oral QHS   pregabalin  100 mg Oral TID   QUEtiapine  300 mg Oral QHS   sodium chloride flush  3 mL Intravenous Q12H    Objective: Vital signs in last 24 hours: Patient Vitals for the past 24 hrs:  BP Temp Temp src Pulse Resp SpO2  02/03/23 1100 129/67 98.1 F (36.7 C) -- 71 16 98 %  02/03/23 0759 136/71 97.9 F (36.6 C) Oral 74 18 97 %  02/03/23 0731 -- -- -- -- -- 95 %  02/03/23 0246 (!) 121/58 -- -- 64 19 96 %  02/02/23 2318 (!) 105/50 98.4 F (36.9 C) -- 80 20 96 %  02/02/23 2054 -- -- -- -- -- 97 %  02/02/23 2016 115/65 98.4 F (36.9 C) -- 76 18 97 %  02/02/23 1553 (!) 113/55 97.7 F (36.5 C) Oral 88 20 99 %     LDA Rt PICC  PHYSICAL EXAM:  General: Alert, cooperative, no distress, appears stated age.  Lungs: b/l air entry Few basal crepts Heart: irregular Abdomen: Soft, non-tender,not distended. Bowel sounds normal. No masses Extremities: atraumatic, no cyanosis. No edema. No clubbing Skin: No rashes or lesions. Or bruising Lymph: Cervical, supraclavicular normal. Neurologic: Grossly non-focal  Lab Results  Latest Ref Rng & Units 02/03/2023    3:20 PM 02/02/2023    5:51 AM 02/01/2023    2:06 AM  CBC  WBC 4.0 - 10.5 K/uL 2.6  2.2  7.7   Hemoglobin 12.0 - 15.0 g/dL 8.7  8.6  9.4   Hematocrit 36.0 - 46.0 % 29.8  29.4  31.7   Platelets 150 - 400 K/uL 189  181  204        Latest Ref Rng & Units 02/03/2023    6:15 AM 02/02/2023    5:51 AM 02/01/2023    2:06 AM  CMP  Glucose 70 - 99 mg/dL  469  629   BUN 8 - 23 mg/dL  16  19   Creatinine 5.28 - 1.00 mg/dL 4.13  2.44  0.10   Sodium 135 - 145 mmol/L  137  137   Potassium 3.5 - 5.1 mmol/L  3.6  3.8   Chloride 98 - 111 mmol/L  109  106   CO2 22 - 32 mmol/L  22  21   Calcium 8.9 - 10.3 mg/dL  7.9  7.9   Total Protein 6.5 - 8.1 g/dL   6.6   Total Bilirubin 0.3 - 1.2 mg/dL   0.7   Alkaline Phos 38 - 126 U/L   64   AST 15 - 41 U/L   16   ALT 0 - 44 U/L   14        Microbiology: 01/31/23 BC NG Studies/Results: CT CHEST WO CONTRAST  Result Date: 02/02/2023 CLINICAL DATA:  Pneumonia, complication suspected, xray done EXAM: CT CHEST WITHOUT CONTRAST TECHNIQUE: Multidetector CT imaging of the chest was performed following the standard protocol without IV contrast. RADIATION DOSE REDUCTION: This exam was performed according to the departmental dose-optimization program which includes automated exposure control, adjustment of the mA and/or kV according to patient size and/or use of iterative reconstruction technique. COMPARISON:  Chest x-ray 02/01/2023.  Chest CT 07/29/2020 FINDINGS: Cardiovascular: Heart is normal size. Aorta is normal caliber. Mediastinum/Nodes: No mediastinal, hilar, or axillary adenopathy. Trachea and esophagus are unremarkable. Thyroid unremarkable. Lungs/Pleura: Dependent and basilar atelectasis or scarring on the right. Interstitial thickening and ground-glass opacities in the upper lobes, likely scarring/chronic lung disease. No areas of confluent consolidation or effusions. Upper Abdomen: No acute findings Musculoskeletal: Chest wall soft tissues are unremarkable. No acute bony abnormality. IMPRESSION: Interstitial thickening and ground-glass opacities in the upper lobes, likely scarring/chronic lung disease. Dependent and basilar atelectasis or scarring on the right Electronically Signed   By: Charlett Nose M.D.   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 Discussed the management with the patient in detail PT will follow with [facility] Informed their OPAT team and provider See my opat note Will sign off- call if needed

## 2023-02-03 NOTE — Treatment Plan (Signed)
Diagnosis: MRSA left 4th toe infection Pt on treatment from [facility]  Baseline Creatinine  0.99 on 02/03/23 Was 1.54 on 02/01/23   Allergies  Allergen Reactions   Cefepime Anaphylaxis    lips swell and the break out in blisters.   Ceftriaxone Anaphylaxis    Angioedema while admitted at [facility] 01/12/2023 Tolerated amoxicillin    Cephalexin Hives   Codeine Palpitations, Nausea Only, Nausea And Vomiting, Rash and Shortness Of Breath    "makes heart fly, she gets flushed and passes out"   Doxycycline Rash   Propoxyphene Rash and Shortness Of Breath    Increase heart rate   Sulfa Antibiotics Palpitations, Nausea Only, Shortness Of Breath and Hives    "makes heart fly, she gets flushed and passes out"   Clindamycin Rash    Tongue swelling, oral sores, and Mouth rash   Lovenox [Enoxaparin Sodium] Hives   Hydrocodone Nausea And Vomiting    Hear racing & breaks out into a cold sweat.   Meropenem Rash    Erythematous, hot, pruritic rash over arms, chest, back, abdomen, and face occurred at the end of meropenem infusion on 02/22/18    OPAT Orders Discharge antibiotics: Vancomycin 2 grams IV every 24 hlurs Per pharmacy protocol  Aim for Vancomycin trough 15-20 (unless otherwise indicated) Monitor creatinine and vanco trough and adjust dose accordingly Duration: Total of 6 weeks End Date: 02/20/23  [facility] Care Per Protocol:  Labs weekly  on Monday while on IV antibiotics: _X_ CBC with differential _X_ CMP _X_ CRP _X_ ESR _X_ Vancomycin trough Labs on Thursday while on antibiotic XBMP X Vanco Trough   _X_ Please leave PIC in place until doctor has seen patient or been notified  Fax weekly lab results  promptly to  [facility] OPAT PROGRAM Fax: [phone] [facility] OPAT Program Phone: [phone]   Clinic Follow Up Appt: with [facility] ID physicians Dr.Saunders Dr.Michael Motley   Call [facility] OPAT Program Phone: [phone] with critical values and questions

## 2023-02-03 NOTE — Progress Notes (Signed)
PHARMACY CONSULT NOTE FOR:  OUTPATIENT  PARENTERAL ANTIBIOTIC THERAPY (OPAT)  Indication: MRSA foot infection - continuation of treatment, replacing daptomycin due to concern for daptomycin-induced eosinophilic pneumonia Regimen: Vancomycin 2gm IV q24h End date: 02/20/2023  Labs - Monday:  CBC/D, CMP, and vancomycin trough. Labs - Thursday:  BMP and vancomycin trough Labs - Once weekly: ESR and CRP  -Please leave PICC in place until doctor has seen patient or been notified  -Fax weekly lab results  promptly to  [facility] OPAT PROGRAM Fax: [phone] [facility] OPAT Program Phone: [phone]   Plan discussed with Marylene Land, PharmD with OPAT team at [facility].on 02/02/23.  She stated she will obtain orders from [facility].    IV antibiotic discharge orders are pended. To discharging provider:  please sign these orders via discharge navigator,  Select New Orders & click on the button choice - Manage This Unsigned Work.     Thank you for allowing pharmacy to be a part of this patient's care.  Juliette Alcide, PharmD, BCPS, BCIDP Work Cell: [phone] 02/03/2023 4:59 PM

## 2023-02-03 NOTE — TOC Progression Note (Addendum)
Transition of Care [facility]) - Progression Note    Patient Details  Name: [name] [name] MRN: [id number] Date of Birth: 1960-10-04  [facility]) CM/SW Contact  Darolyn Rua, [place] Phone Number: 02/03/2023, 2:53 PM  Clinical Narrative:     [facility] home specialist called [facility]W following for home infusion - [phone] [name] pharmacist to follow up  [phone] Fax  Update- patient indicated preference for local iv infusion agency [name] will follow up.    Expected Discharge Plan: Home w Home Health Services Barriers to Discharge: Continued Medical Work up  Expected Discharge Plan and Services       Living arrangements for the past 2 months: Single Family Home                                       Social Determinants of Health (SDOH) Interventions SDOH Screenings   Food Insecurity: No Food Insecurity (02/01/2023)  Housing: Low Risk  (02/01/2023)  Transportation Needs: No Transportation Needs (02/01/2023)  Utilities: Not At Risk (02/01/2023)  Depression (PHQ2-9): High Risk (10/04/2022)  Financial Resource Strain: Medium Risk (06/02/2022)   Received from [facility]  Stress: Stress Concern Present (04/28/2022)  Tobacco Use: Medium Risk (01/31/2023)    Readmission Risk Interventions    01/05/2022   11:09 AM  Readmission Risk Prevention Plan  Transportation Screening Complete  PCP or Specialist Appt within 3-5 Days Complete  Social Work Consult for Recovery Care Planning/Counseling Complete  Palliative Care Screening Not Applicable  Medication Review Oceanographer) Complete

## 2023-02-03 NOTE — Progress Notes (Addendum)
[facility]  - [place] at [facility]   PATIENT NAME: [name] [name]    MR#:  [id number]  DATE OF BIRTH:  October 29, 1960  SUBJECTIVE:   NO family at bedside Sat on the Bedside commode, had BM. Felt a bit dizzy.  Oxygen down to 4L [place] oxygen (baseline) no fever    VITALS:  Blood pressure 129/67, pulse 71, temperature 98.1 F (36.7 C), resp. rate 16, height 5\' 7"  (1.702 m), weight (!) 156.5 kg, last menstrual period 04/20/2001, SpO2 98%.  PHYSICAL EXAMINATION:   GENERAL:  62 y.o.-year-old patient with no acute distress. Morbid obesity LUNGS: decreased breath sounds bilaterally, no wheezing CARDIOVASCULAR: S1, S2 normal. No murmur   ABDOMEN: Soft, nontender, nondistended. Bowel sounds present.  EXTREMITIES: +  edema b/l.     NEUROLOGIC: nonfocal  patient is alert and awake  LABORATORY PANEL:  CBC Recent Labs  Lab 02/02/23 0551  WBC 2.2*  HGB 8.6*  HCT 29.4*  PLT 181    Chemistries  Recent Labs  Lab 02/01/23 0206 02/02/23 0551 02/03/23 0615  NA 137 137  --   K 3.8 3.6  --   CL 106 109  --   CO2 21* 22  --   GLUCOSE 179* 111*  --   BUN 19 16  --   CREATININE 1.54* 1.24* 0.99  CALCIUM 7.9* 7.9*  --   AST 16  --   --   ALT 14  --   --   ALKPHOS 64  --   --   BILITOT 0.7  --   --    Cardiac Enzymes No results for input(s): "TROPONINI" in the last 168 hours. RADIOLOGY:  CT CHEST WO CONTRAST  Result Date: 02/02/2023 CLINICAL DATA:  Pneumonia, complication suspected, xray done EXAM: CT CHEST WITHOUT CONTRAST TECHNIQUE: Multidetector CT imaging of the chest was performed following the standard protocol without IV contrast. RADIATION DOSE REDUCTION: This exam was performed according to the departmental dose-optimization program which includes automated exposure control, adjustment of the mA and/or kV according to patient size and/or use of iterative reconstruction technique. COMPARISON:  Chest x-ray 02/01/2023.  Chest CT 07/29/2020 FINDINGS:  Cardiovascular: Heart is normal size. Aorta is normal caliber. Mediastinum/Nodes: No mediastinal, hilar, or axillary adenopathy. Trachea and esophagus are unremarkable. Thyroid unremarkable. Lungs/Pleura: Dependent and basilar atelectasis or scarring on the right. Interstitial thickening and ground-glass opacities in the upper lobes, likely scarring/chronic lung disease. No areas of confluent consolidation or effusions. Upper Abdomen: No acute findings Musculoskeletal: Chest wall soft tissues are unremarkable. No acute bony abnormality. IMPRESSION: Interstitial thickening and ground-glass opacities in the upper lobes, likely scarring/chronic lung disease. Dependent and basilar atelectasis or scarring on the right Electronically Signed   By: Charlett Nose M.D.   On: 02/02/2023 21:07    Assessment and Plan  62 year old female past medical history of chronic diastolic heart failure, type 2 diabetes mellitus, GERD, hypertension, hypothyroidism, irritable bowel, depression, rheumatoid arthritis, obesity, MRSA infection.  Was recently at [facility] and prescribed daptomycin IV to go home with for MRSA infection of the foot.   Patient presenting with fever.  Started on empiric antibiotics.  Blood cultures negative.   10/15.  Now on high flow nasal cannula, chronically wears 4 L).  Patient little lethargic.  ABG does not show CO2 retention.  So far blood cultures are negative.  1 dose of Lasix given and fluids held. 10/16: Assuming care for pt. Patient says breathing has settled down. Case discussed with ID Dr.  Ravisankar to follow patient and also pulmonary Dr. Marcos Eke to follow. CT chest without contrast ordered     Assessment and Plan:  Severe sepsis (HCC) Present on admission with fever, tachypnea, positive urine analysis, multifocal pneumonia on chest x-ray, previous treatment of MRSA with daptomycin.   -- Patient currently on vancomycin, Aztreonam and zithromax--narrow down to IV vancomycin per ID  Acute  respiratory failure with hypoxia (HCC) Acute on chronic diastolic CHF (congestive heart failure) (HCC) --Stopped IV fluids this morning given 1 dose of Lasix because of worsening respiratory status overnight.  --Patient now on 4 L  nasal cannula with good saturations.  Chronically wears 4 L.    Multifocal infiltrate bilaterally ?Daptomycin induced PNA --discontinued daptomcyin due to concern for pneumonia related to it. -- Infectious disease consultation with Dr. Joylene Draft. Recommends vancomycin for now.  --see OPAT orders --Pulmonary consultation with Dr. Merla Riches this it Daptomycin + post covid inflammatory changes   Acute UTI UC--no growth   Chronic respiratory failure with hypoxia (HCC) Pulmonary fibrosis (HCC) COPD (chronic obstructive pulmonary disease) (HCC) Continue nebulizer treatments With chronic respiratory failure on 4 L chronically.    Diabetic ulcer of left foot (HCC) Continue vancomycin. Pt was on IV daptomycin thru [facility] (recently)--now d/ced Resume metformin and trajdenta    HLD (hyperlipidemia) Cont statins   Essential (primary) hypertension Resumed BB    PAF (paroxysmal atrial fibrillation) (HCC) Continue patient on Eliquis.   Depression with anxiety Continue Seroquel.   Hypothyroidism Continue levothyroxine.   Rheumatoid arthritis involving multiple sites with positive rheumatoid factor (HCC) --on methotrexate and plaquenil  Anemia Last hemoglobin 9.4 will check a ferritin tomorrow.   Morbid obesity with BMI of 50.0-59.9, adult (HCC) BMI 54.03   CKD stage 3b, GFR 30-44 ml/min (HCC) stable    Family communication :none today Consults : infectious disease, pulmonary CODE STATUS: full DVT Prophylaxis : eliquis Level of care: Progressive Status is: Inpatient Remains inpatient appropriate because: acute respiratory failure multifocal pneumonia  If remains stable patient will discharge to home with home health and IV antibiotics.     TOTAL TIME TAKING CARE OF THIS PATIENT: 35 minutes.  >50% time spent on counselling and coordination of care  Note: This dictation was prepared with Dragon dictation along with smaller phrase technology. Any transcriptional errors that result from this process are unintentional.  Enedina Finner M.D    [facility]   CC: Primary care physician; Etheleen Nicks, NP

## 2023-02-03 NOTE — Progress Notes (Signed)
Pharmacy - Antimicrobial Stewardship  Due to concerns for daptomycin-induced eosinophilic pneumonia,  plan per ID (Dr Rivka Safer) is to discharge on vancomycin until original stop date of 02/20/2023.  Prefer patient follows with [facility] since she will continue to follow with [facility] and thus can work more closely.   I have called OPAT team at [facility] as per Dr Lelon Perla (ID physician at [facility] who follows patienit) when we communicated via chat function in [facility].  Pharmacist Marylene Land) aware of plan to change to vancomycin 2gm IV q24h at discharge.  Plan is to given patient 10am dose 10/18 so to start dose at home on 10/18.  Do not need to fax order as OPAT team at [facility] will obtain orders from [facility])  Note - We do not have vancomycin levels yet as not at steady state.  10/18 will be her 3rd dose of vancomycin 2gm,  SHe did receive a 2.5gm loading dose on 10/14 at 1730 but due to renal function checked random level 10/15 at 21:47 = 11 mcg/ml but due to reagent shortage/backorder the level did not come back until 10/16 at 0530 and then 2gm dose given at 0911 10/16.  The vancomycin 2gm IV q24h dose was based on recent dosing regimen while inpatient at [facility].    Jomarie Longs from [facility] home infusion also contacted and given information about vancomycin dose, anticipated d/c tomorrow and 1st dose in home on 10/19.  TOC is aware he will need a referral for home infusion prior ot discharge.  Juliette Alcide, PharmD, BCPS, BCIDP Work Cell: [phone] 02/03/2023 4:54 PM

## 2023-02-04 DIAGNOSIS — J189 Pneumonia, unspecified organism: Secondary | ICD-10-CM | POA: Diagnosis not present

## 2023-02-04 DIAGNOSIS — A419 Sepsis, unspecified organism: Secondary | ICD-10-CM | POA: Diagnosis not present

## 2023-02-04 DIAGNOSIS — J9601 Acute respiratory failure with hypoxia: Secondary | ICD-10-CM | POA: Diagnosis not present

## 2023-02-04 DIAGNOSIS — R6520 Severe sepsis without septic shock: Secondary | ICD-10-CM | POA: Diagnosis not present

## 2023-02-04 LAB — CBC WITH DIFFERENTIAL/PLATELET
Abs Immature Granulocytes: 0.05 10*3/uL (ref 0.00–0.07)
Basophils Absolute: 0 10*3/uL (ref 0.0–0.1)
Basophils Relative: 1 %
Eosinophils Absolute: 0 10*3/uL (ref 0.0–0.5)
Eosinophils Relative: 0 %
HCT: 29.7 % — ABNORMAL LOW (ref 36.0–46.0)
Hemoglobin: 8.9 g/dL — ABNORMAL LOW (ref 12.0–15.0)
Immature Granulocytes: 1 %
Lymphocytes Relative: 25 %
Lymphs Abs: 1.1 10*3/uL (ref 0.7–4.0)
MCH: 22.8 pg — ABNORMAL LOW (ref 26.0–34.0)
MCHC: 30 g/dL (ref 30.0–36.0)
MCV: 76 fL — ABNORMAL LOW (ref 80.0–100.0)
Monocytes Absolute: 0.3 10*3/uL (ref 0.1–1.0)
Monocytes Relative: 6 %
Neutro Abs: 2.9 10*3/uL (ref 1.7–7.7)
Neutrophils Relative %: 67 %
Platelets: 228 10*3/uL (ref 150–400)
RBC: 3.91 MIL/uL (ref 3.87–5.11)
RDW: 18.8 % — ABNORMAL HIGH (ref 11.5–15.5)
WBC: 4.4 10*3/uL (ref 4.0–10.5)
nRBC: 0 % (ref 0.0–0.2)

## 2023-02-04 LAB — CREATININE, SERUM
Creatinine, Ser: 0.92 mg/dL (ref 0.44–1.00)
GFR, Estimated: >60 mL/min (ref >60)

## 2023-02-04 LAB — GLUCOSE, CAPILLARY: Glucose-Capillary: 120 mg/dL — ABNORMAL HIGH (ref 70–99)

## 2023-02-04 LAB — HEMOGLOBIN A1C
Hgb A1c MFr Bld: 8.1 % — ABNORMAL HIGH (ref 4.8–5.6)
Mean Plasma Glucose: 185.77 mg/dL

## 2023-02-04 NOTE — TOC Transition Note (Signed)
Transition of Care [facility]) - CM/SW Discharge Note   Patient Details  Name: [name] [name] MRN: [id number] Date of Birth: 1960/05/22  [facility]) CM/SW Contact:  Darolyn Rua, LCSW Phone Number: 02/04/2023, 10:59 AM   Clinical Narrative:     Patient to discharge home today with IV vanc home infusion.   All home infusion orders have been faxed to Jomarie Longs (phone: [phone] OR [phone] )  at [facility] infusion at Fax: [phone]   Patient aware of above, reports family to pick her up today and that she only wants [facility] nurse coming out for home infusion does not think she needs HH PT at this time.   No additional discharge needs identified.  Final next level of care: Home w Home Health Services Barriers to Discharge: No Barriers Identified   Patient Goals and CMS Choice CMS Medicare.gov Compare Post Acute Care list provided to:: Patient Choice offered to / list presented to : Patient  Discharge Placement                         Discharge Plan and Services Additional resources added to the After Visit Summary for                                       Social Determinants of Health (SDOH) Interventions SDOH Screenings   Food Insecurity: No Food Insecurity (02/01/2023)  Housing: Low Risk  (02/01/2023)  Transportation Needs: No Transportation Needs (02/01/2023)  Utilities: Not At Risk (02/01/2023)  Depression (PHQ2-9): High Risk (10/04/2022)  Financial Resource Strain: Medium Risk (06/02/2022)   [facility] from [facility]  Stress: Stress Concern Present (04/28/2022)  Tobacco Use: Medium Risk (01/31/2023)     Readmission Risk Interventions    01/05/2022   11:09 AM  Readmission Risk Prevention Plan  Transportation Screening Complete  PCP or Specialist Appt within 3-5 Days Complete  Social Work Consult for Recovery Care Planning/Counseling Complete  Palliative Care Screening Not Applicable  Medication Review Special educational needs teacher) Complete

## 2023-02-04 NOTE — Progress Notes (Signed)
Pharmacy Antibiotic Note  [name] [name] is a 62 y.o. female admitted on 01/31/2023 with sepsis, UTI, and wound infection . 10/14 Chest CT found streaky mild right perihilar opacities which could represent atelectasis or early pneumonia. 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 At her last hospitalization with [facility], she was initially on CTX/vanc targeting MRSA and Proteus but the CTX was discontinued after developing mild angioedema felt secondary to drug allergy. 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 She was set up with OPAT and home infusion via PICC line prior to discharge. Her daptomycin dose at home was 750 mg q24h to end on 02/20/23. [facility] was managing her home infusion.   On this admission, cefepime had been changed to aztreonam due to recent concern for reaction to ceftriaxone 01/12/23 at [facility] (concern for angioedema). Daptomycin stopped and vancomycin started 10/15 due to concerns for daptomycin-induced eosinophilic pneumonia. 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 Patient will continue to follow with [facility] managing her home infusion. Pharmacy has been consulted for vancomycin dosing.   Today, 02/04/2023 Renal: SCr improved to 0.92 (around BL) Patient was on vancomycin during admission at [facility] - on 9/25 had therapeutic trough (14.8 mcg/ml) on vancomycin 2gm IV q24h. SCr at that time was 1.20 WBC decreased 2.6 Tmax/24h: 98.3 F  Vancomycin levels: 10/15 random vancomycin level at 21:47 = 11 mcg/mL  Received vancomycin 2.5gm IV x 1 10/14 at ~17:30 No further vancomycin levels planned before anticipated discharge 10/18 If patient doesn't discharge 10/18 consider trough with 10/19 AM labs  Plan: Based on recent  levels at [facility] with similar SCr to current SCr value, start vancomycin 2gm IV q24h (trough was 14.8 mcg/mL on this dose at [facility]) Monitor SCr and need to recheck vancomycin trough  Pharmacy will continue to monitor and adjust dose as needed  OPAT Communication [facility] ID fellow, Dr Letta Moynahan asked that we communicate discharge antibiotic plan with OPAT team Per Lelon Perla: If you are able to communicate this, it would be helpful to have the eventual discharging team communicate with our OPAT team in order to coordinate a plan for them to continue following after discharge:   [facility] OPAT Program Phone: [phone]  [facility] OPAT Program Fax:  [phone]   Height: 5\' 7"  (170.2 cm) Weight: (!) 156.5 kg (345 lb) IBW/kg (Calculated) : 61.6  Temp (24hrs), Avg:98.3 F (36.8 C), Min:97.9 F (36.6 C), Max:98.6 F (37 C)  Recent Labs  Lab 01/31/23 1342 01/31/23 1635 02/01/23 0206 02/01/23 2147 02/02/23 0551 02/03/23 0615 02/03/23 1520 02/04/23 0415  WBC 9.9  --  7.7  --  2.2*  --  2.6*  --   CREATININE 1.35*  --  1.54*  --  1.24* 0.99  --  0.92  LATICACIDVEN 3.5* 1.5  --   --   --   --   --   --   VANCORANDOM  --   --   --  11  --   --   --   --     Estimated Creatinine Clearance: 101 mL/min (by C-G formula based on SCr of 0.92 mg/dL).    Allergies  Allergen Reactions   Cefepime Anaphylaxis    lips swell and the break out in  blisters.   Ceftriaxone Anaphylaxis    Angioedema while admitted at [facility] 01/12/2023 Tolerated amoxicillin    Cephalexin Hives   Codeine Palpitations, Nausea Only, Nausea And Vomiting, Rash and Shortness Of Breath    "makes heart fly, she gets flushed and passes out"   Doxycycline Rash   Propoxyphene Rash and Shortness Of Breath    Increase heart rate   Sulfa Antibiotics Palpitations, Nausea Only, Shortness Of Breath and Hives    "makes heart fly, she gets flushed and passes out"   Clindamycin Rash    Tongue swelling, oral sores, and Mouth rash   Daptomycin  Other (See Comments)    Fever and pulmonary infiltrate   Lovenox [Enoxaparin Sodium] Hives   Hydrocodone Nausea And Vomiting    Hear racing & breaks out into a cold sweat.   Meropenem Rash    Erythematous, hot, pruritic rash over arms, chest, back, abdomen, and face occurred at the end of meropenem infusion on 02/22/18    Antimicrobials this admission: 10/14 vancomycin  x 1 10/14 cefepime x 1 dose  10/14 daptomycin >> 10/15 10/15 aztreonam >> 10/17 10/14 vancomycin >>  Dose adjustments this admission: None  Microbiology results: 10/14 BCx: NGTD 10/15 Ucx: NG 10/16 MRSA PCR: neg 10/16 respiratory panel: negative  Thank you for allowing pharmacy to be a part of this patient's care.  Effie Shy, PharmD Pharmacy Resident  02/04/2023 8:22 AM

## 2023-02-04 NOTE — Discharge Summary (Signed)
Physician Discharge Summary   Patient: [name] [name] MRN: [id number] DOB: [birth year]  Admit date:     01/31/2023  Discharge date: 02/04/23  Discharge Physician: Lurene Shadow   PCP: Etheleen Nicks, NP   Recommendations at discharge:  {Tip this will not be part of the note when signed- Example include specific recommendations for outpatient follow-up, pending tests to follow-up on. (Optional):26781} Follow-up with ID specialist at [facility].  Patient says she will call to make appointment. Follow-up with PCP in 1 to 2 weeks. 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[employer])   Acute UTI   Pulmonary fibrosis (HCC)   Chronic respiratory failure with hypoxia (HCC)   Nausea & vomiting   Diabetic ulcer of left foot (HCC)   Abdominal pain   COPD (chronic obstructive pulmonary disease) (HCC)   Essential (primary) hypertension   HLD (hyperlipidemia)   PAF (paroxysmal atrial fibrillation) (HCC)   Diabetic peripheral neuropathy (HCC)   Gastroparesis   Depression with anxiety   Hypothyroidism   Rheumatoid arthritis involving multiple sites with positive rheumatoid factor (HCC)   Morbid obesity with BMI of 50.0-59.9, adult (HCC)   Anemia   MRSA infection   CKD stage 3b, GFR 30-44 ml/min (HCC)   Diabetic infection of left foot (HCC)   Community acquired pneumonia  Resolved Problems:   Rheumatoid arthritis (HCC)   Acute cystitis   Lactic acidosis  Hospital Course:  [name] [name] is a 62 year old with medical history significant for hypoxemic respiratory failure on 4 L of oxygen, morbid obesity, obesity hypoventilation syndrome, chronic diastolic CHF, rheumatoid arthritis, recent  diagnosis of left fourth toe osteomyelitis with MRSA infection at [facility] in September 2024.  She was discharged home on 6 weeks of IV daptomycin 750 mg daily.  She presented to [facility] on 01/31/2023 because of fever and left foot pain, nausea and vomiting.  She was found fever with bilateral infiltrates concerning for daptomycin eosinophilic pneumonia, severe sepsis, acute hypoxemic respiratory failure and acute toxic metabolic encephalopathy.     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   Diabetic left foot ulcer, left fourth toe osteomyelitis, MRSA infection: ID suspect Specialist recommended IV vancomycin through 02/20/2023.  Patient will follow-up with ID specialist at [facility] for further management.   Acute on chronic hypoxemic respiratory failure, underlying pulmonary fibrosis and COPD: She was requiring up to 8 L/min oxygen via high flow nasal cannula.  She is down to 2 L/min oxygen.  She normally uses 4 L/min oxygen at baseline.   Paroxysmal atrial fibrillation: Continue Eliquis   Other comorbidities include depression, anxiety, hypothyroidism, rheumatoid arthritis, chronic anemia, hyperlipidemia, chronic diastolic CHF   {Tip this will not be part of the note when signed Body mass index is 54.03 kg/m. , ,  (Optional):26781}   Consultants: ID specialist, neurologist Procedures performed: None Disposition: Home health Diet recommendation:  Discharge Diet Orders (From admission, onward)     Start     Ordered   02/04/23 0000  Diet - low sodium heart healthy        02/04/23 1107  Cardiac and Carb modified diet DISCHARGE MEDICATION: Allergies as of 02/04/2023       Reactions   Cefepime Anaphylaxis   lips swell and the break out in blisters.   Ceftriaxone  Anaphylaxis   Angioedema while admitted at [facility] 01/12/2023 Tolerated amoxicillin    Cephalexin Hives   Codeine Palpitations, Nausea Only, Nausea And Vomiting, Rash, Shortness Of Breath   "makes heart fly, she gets flushed and passes out"   Doxycycline Rash   Propoxyphene Rash, Shortness Of Breath   Increase heart rate   Sulfa Antibiotics Palpitations, Nausea Only, Shortness Of Breath, Hives   "makes heart fly, she gets flushed and passes out"   Clindamycin Rash   Tongue swelling, oral sores, and Mouth rash   Daptomycin Other (See Comments)   Fever and pulmonary infiltrate   Lovenox [enoxaparin Sodium] Hives   Hydrocodone Nausea And Vomiting   Hear racing & breaks out into a cold sweat.   Meropenem Rash   Erythematous, hot, pruritic rash over arms, chest, back, abdomen, and face occurred at the end of meropenem infusion on 02/22/18        Medication List     TAKE these medications    acetaminophen 325 MG tablet Commonly known as: TYLENOL Take 2 tablets (650 mg total) by mouth every 6 (six) hours as needed for mild pain or moderate pain (or Fever >/= 101).   amLODipine 10 MG tablet Commonly known as: NORVASC Take 10 mg by mouth daily.   ARIPiprazole 5 MG tablet Commonly known as: ABILIFY Take 1 tablet (5 mg total) by mouth daily.   ascorbic acid 500 MG tablet Commonly known as: VITAMIN C Take 1 tablet (500 mg total) by mouth daily.   atorvastatin 80 MG tablet Commonly known as: LIPITOR Take 80 mg by mouth daily.   Breztri Aerosphere 160-9-4.8 MCG/ACT Aero Generic drug: Budeson-Glycopyrrol-Formoterol Inhale 2 puffs into the lungs in the morning and at bedtime.   busPIRone 10 MG tablet Commonly known as: BUSPAR Take 1 tablet (10 mg total) by mouth 2 (two) times daily.   Eliquis 5 MG Tabs tablet Generic drug: apixaban TAKE 1 TABLET BY MOUTH TWICE DAILY   famotidine 20 MG tablet Commonly known as: PEPCID Take 1 tablet (20 mg total) by mouth daily.   FLUoxetine  40 MG capsule Commonly known as: PROzac Take 1 capsule (40 mg total) by mouth daily.   folic acid 1 MG tablet Commonly known as: FOLVITE Take 1 mg by mouth daily.   GNP Calcium Citrate+D Maximum 315-6.25 MG-MCG Tabs Generic drug: Calcium Citrate-Vitamin D3 Take 1 tablet by mouth daily.   hydroxychloroquine 200 MG tablet Commonly known as: PLAQUENIL Take 200 mg by mouth 2 (two) times daily.   levothyroxine 88 MCG tablet Commonly known as: SYNTHROID Take 88 mcg by mouth daily before breakfast.   metFORMIN 500 MG 24 hr tablet Commonly known as: GLUCOPHAGE-XR Take 500 mg by mouth daily.   methotrexate 2.5 MG tablet Commonly known as: RHEUMATREX Take 15 mg by mouth once a week. Sunday   metoCLOPramide 5 MG/5ML solution Commonly known as: REGLAN Take 5 mg by mouth 4 (four) times daily -  before meals and at bedtime.   metoprolol tartrate 25 MG tablet Commonly known as: LOPRESSOR Take 25 mg by mouth 2 (two) times daily.   pramipexole 0.5 MG tablet Commonly known as: MIRAPEX Take 0.5 mg by mouth at bedtime. What changed: Another medication with the same name was removed. Continue taking this medication, and  follow the directions you see here.   pregabalin 100 MG capsule Commonly known as: Lyrica Take 1 capsule (100 mg total) by mouth 3 (three) times daily.   promethazine 12.5 MG tablet Commonly known as: PHENERGAN Take 12.5 mg by mouth.   QUEtiapine 300 MG tablet Commonly known as: SEROQUEL TAKE ONE TABLET BY MOUTH AT BEDTIME.   Tradjenta 5 MG Tabs tablet Generic drug: linagliptin Take 5 mg by mouth daily.   vancomycin IVPB Inject 2,000 mg into the vein daily for 15 days. Indication:  MRSA foot infection - continuation of treatment, replacing daptomycin due to concern for daptomycin-induced eosinophilic pneumonia Last Day of Therapy:  02/20/2023 Labs - Monday:  CBC/D, CMP, and vancomycin trough. Labs - Thursday:  BMP and vancomycin trough Labs - Once weekly: ESR  and CRP Method of administration:Elastomeric Method of administration may be changed at the discretion of the patient and/or caregiver's ability to self-administer the medication ordered. Please leave PICC in place until doctor has seen patient or been notified Fax weekly lab results  promptly to  [facility] Fax: [phone] [facility] Phone: [phone] Start taking on: February 05, 2023   Vitamin D 125 MCG (5000 UT) Caps Take 1 capsule by mouth daily.   zinc sulfate 220 (50 Zn) MG capsule Take 220 mg by mouth at bedtime.               Discharge Care Instructions  (From admission, onward)           Start     Ordered   02/04/23 0000  Discharge wound care:       Comments: Follow up with podiatrist   02/04/23 1107   02/03/23 0000  Change dressing on IV access line weekly and PRN  (Home infusion instructions - [facility] )        02/03/23 1704            Follow-up Information     Salena Saner, MD. Schedule an appointment as soon as possible for a visit in 2 week(s).   Specialty: Pulmonary Disease Contact information: [address] [place] [address] [phone]                Discharge Exam: Ceasar Mons Weights   01/31/23 1329  Weight: (!) 156.5 kg   GEN: NAD SKIN: Warm and dry EYES: No pallor or icterus ENT: MMM CV: RRR PULM: CTA B ABD: soft, obese, NT, +BS CNS: AAO x 3, non focal EXT: No edema or tenderness   Condition at discharge: good  The results of significant diagnostics from this hospitalization (including imaging, microbiology, ancillary and laboratory) are listed below for reference.   Imaging Studies: CT CHEST WO CONTRAST  Result Date: 02/02/2023 CLINICAL DATA:  Pneumonia, complication suspected, xray done EXAM: CT CHEST WITHOUT CONTRAST TECHNIQUE: Multidetector CT imaging of the chest was performed following the standard protocol without IV contrast. RADIATION DOSE REDUCTION: This  exam was performed according to the departmental dose-optimization program which includes automated exposure control, adjustment of the mA and/or kV according to patient size and/or use of iterative reconstruction technique. COMPARISON:  Chest x-ray 02/01/2023.  Chest CT 07/29/2020 FINDINGS: Cardiovascular: Heart is normal size. Aorta is normal caliber. Mediastinum/Nodes: No mediastinal, hilar, or axillary adenopathy. Trachea and esophagus are unremarkable. Thyroid unremarkable. Lungs/Pleura: Dependent and basilar atelectasis or scarring on the right. Interstitial thickening and ground-glass opacities in the upper lobes, likely scarring/chronic lung disease. No areas of confluent consolidation  or effusions. Upper Abdomen: No acute findings Musculoskeletal: Chest wall soft tissues are unremarkable. No acute bony abnormality. IMPRESSION: Interstitial thickening and ground-glass opacities in the upper lobes, likely scarring/chronic lung disease. Dependent and basilar atelectasis or scarring on the right Electronically Signed   By: Charlett Nose M.D.   On: 02/02/2023 21:07   DG Chest Port 1 View  Result Date: 02/01/2023 CLINICAL DATA:  [id number] Acute respiratory failure with hypoxia (HCC) 045409. Fever, hypoxia. EXAM: PORTABLE CHEST 1 VIEW COMPARISON:  Chest radiograph 01/31/2023. FINDINGS: Right upper extremity PICC tip projects over the right atrium. Slightly increased bilateral reticular opacities, right-greater-than-left, suspicious for multifocal infection. Possible small right pleural effusion. Stable cardiac and mediastinal contours. No pneumothorax. IMPRESSION: Slightly increased bilateral reticular opacities, right-greater-than-left, suspicious for multifocal infection. Electronically Signed   By: Orvan Falconer M.D.   On: 02/01/2023 12:00   CT ABDOMEN PELVIS W CONTRAST  Result Date: 01/31/2023 CLINICAL DATA:  Diarrhea. EXAM: CT ABDOMEN AND PELVIS WITH CONTRAST TECHNIQUE: Multidetector CT imaging of the  abdomen and pelvis was performed using the standard protocol following bolus administration of intravenous contrast. RADIATION DOSE REDUCTION: This exam was performed according to the departmental dose-optimization program which includes automated exposure control, adjustment of the mA and/or kV according to patient size and/or use of iterative reconstruction technique. CONTRAST:  OMNIPAQUE IOHEXOL 300 MG/ML  SOLN COMPARISON:  February 28, 2022. FINDINGS: Lower chest: No acute abnormality. Hepatobiliary: No focal liver abnormality is seen. Status post cholecystectomy. No biliary dilatation. Pancreas: Unremarkable. No pancreatic ductal dilatation or surrounding inflammatory changes. Spleen: Normal in size without focal abnormality. Adrenals/Urinary Tract: Adrenal glands are unremarkable. Kidneys are normal, without renal calculi, focal lesion, or hydronephrosis. Bladder is unremarkable. Stomach/Bowel: Stomach is within normal limits. Appendix appears normal. No evidence of bowel wall thickening, distention, or inflammatory changes. Vascular/Lymphatic: No significant vascular findings are present. No enlarged abdominal or pelvic lymph nodes. Reproductive: Status post hysterectomy. No adnexal masses. Other: No ascites is noted. Stable moderate size ventral hernia is noted containing fat fluid. Musculoskeletal: No acute or significant osseous findings. IMPRESSION: Stable mild size ventral hernia containing fat and fluid. No acute abnormality seen in the abdomen or pelvis. Electronically Signed   By: Lupita Raider M.D.   On: 01/31/2023 19:40   DG Chest Portable 1 View  Result Date: 01/31/2023 CLINICAL DATA:  Sepsis EXAM: PORTABLE CHEST 1 VIEW COMPARISON:  Chest x-ray February 27, 2022. FINDINGS: Enlarged cardiac silhouette. Pulmonary vascular congestion. Streaky right perihilar opacities. No visible pleural effusions or pneumothorax. No acute bony abnormality. IMPRESSION: 1. Streaky mild right perihilar  opacities which could represent atelectasis or early pneumonia. 2. Cardiomegaly and pulmonary vascular congestion without overt pulmonary edema. Electronically Signed   By: Feliberto Harts M.D.   On: 01/31/2023 18:01   DG Foot Complete Left  Result Date: 01/31/2023 CLINICAL DATA:  Left foot pain for 2 days. Known infection left fourth toe. EXAM: LEFT FOOT - COMPLETE 3+ VIEW COMPARISON:  02/28/2022 FINDINGS: Presumed surgical absence of the distal first digit. Small Achilles and calcaneal spurs. Again identified is a radiopaque foreign object projecting within the plantar forefoot at the level of the third metatarsal neck. Maximally 8 mm. No osseous destruction.Forefoot soft tissue swelling is most apparent dorsally. IMPRESSION: No plain film evidence of osteomyelitis. Radiopaque foreign object about the plantar forefoot, as before. Forefoot soft tissue swelling likely represent cellulitis. Electronically Signed   By: Jeronimo Greaves M.D.   On: 01/31/2023 16:35    Microbiology: Results  for orders placed or performed during the hospital encounter of 01/31/23  Culture, blood (Routine x 2)     Status: None (Preliminary result)   Collection Time: 01/31/23  1:45 PM   Specimen: BLOOD RIGHT ARM  Result Value Ref Range Status   Specimen Description BLOOD RIGHT ARM  Final   Special Requests   Final    BOTTLES DRAWN AEROBIC AND ANAEROBIC Blood Culture results may not be optimal due to an excessive volume of blood received in culture bottles   Culture   Final    NO GROWTH 4 DAYS Performed at [facility], [address], [place], [place] [address]    Report Status PENDING  Incomplete  Culture, blood (Routine x 2)     Status: None (Preliminary result)   Collection Time: 01/31/23  3:00 PM   Specimen: BLOOD LEFT ARM  Result Value Ref Range Status   Specimen Description BLOOD LEFT ARM  Final   Special Requests   Final    BOTTLES DRAWN AEROBIC AND ANAEROBIC Blood Culture adequate volume   Culture    Final    NO GROWTH 4 DAYS Performed at [facility], [address] [place], [place] [address]    Report Status PENDING  Incomplete  SARS Coronavirus 2 by RT PCR (hospital order, performed in [facility] lab) *cepheid single result test* Anterior Nasal Swab     Status: None   Collection Time: 01/31/23  3:35 PM   Specimen: Anterior Nasal Swab  Result Value Ref Range Status   SARS Coronavirus 2 by RT PCR NEGATIVE NEGATIVE Final    Comment: (NOTE) SARS-CoV-2 target nucleic acids are NOT DETECTED.  The SARS-CoV-2 RNA is generally detectable in upper and lower respiratory specimens during the acute phase of infection. The lowest concentration of SARS-CoV-2 viral copies this assay can detect is 250 copies / mL. A negative result does not preclude SARS-CoV-2 infection and should not be used as the sole basis for treatment or other patient management decisions.  A negative result may occur with improper specimen collection / handling, submission of specimen other than nasopharyngeal swab, presence of viral mutation(s) within the areas targeted by this assay, and inadequate number of viral copies (<250 copies / mL). A negative result must be combined with clinical observations, patient history, and epidemiological information.  Fact Sheet for Patients:   RoadLapTop.co.za  Fact Sheet for Healthcare Providers: http://kim-miller.com/  This test is not yet approved or  cleared by the [place] FDA and has been authorized for detection and/or diagnosis of SARS-CoV-2 by FDA under an Emergency Use Authorization (EUA).  This EUA will remain in effect (meaning this test can be used) for the duration of the COVID-19 declaration under Section 564(b)(1) of the Act, 21 U.S.C. section 360bbb-3(b)(1), unless the authorization is terminated or revoked sooner.  Performed at [facility], [address] [place], [place]  [address]   Urine Culture (for pregnant, neutropenic or urologic patients or patients with an indwelling urinary catheter)     Status: None   Collection Time: 02/01/23  1:05 AM   Specimen: Urine, Clean Catch  Result Value Ref Range Status   Specimen Description   Final    URINE, CLEAN CATCH Performed at [facility], [address], [place], [place] [address]    Special Requests   Final    Normal Performed at [facility], [address] [place], [place] [address]    Culture   Final  NO GROWTH Performed at [facility], [address], [place], [place] [address]    Report Status 02/02/2023 FINAL  Final  MRSA Next Gen by PCR, Nasal     Status: None   Collection Time: 02/01/23  6:10 AM   Specimen: Nasal Mucosa; Nasal Swab  Result Value Ref Range Status   MRSA by PCR Next Gen NOT DETECTED NOT DETECTED Final    Comment: (NOTE) The GeneXpert MRSA Assay (FDA approved for NASAL specimens only), is one component of a comprehensive MRSA colonization surveillance program. It is not intended to diagnose MRSA infection nor to guide or monitor treatment for MRSA infections. Test performance is not FDA approved in patients less than 74 years old. Performed at [facility], [address] [place], [place] [address]   Respiratory (~20 pathogens) panel by PCR     Status: None   Collection Time: 02/02/23  4:00 PM   Specimen: Nasopharyngeal Swab; Respiratory  Result Value Ref Range Status   Adenovirus NOT DETECTED NOT DETECTED Final   Coronavirus 229E NOT DETECTED NOT DETECTED Final    Comment: (NOTE) The Coronavirus on the Respiratory Panel, DOES NOT test for the novel  Coronavirus (2019 nCoV)    Coronavirus HKU1 NOT DETECTED NOT DETECTED Final   Coronavirus NL63 NOT DETECTED NOT DETECTED Final   Coronavirus OC43 NOT DETECTED NOT DETECTED Final   Metapneumovirus NOT DETECTED NOT DETECTED Final   Rhinovirus / Enterovirus NOT DETECTED NOT DETECTED Final    Influenza A NOT DETECTED NOT DETECTED Final   Influenza B NOT DETECTED NOT DETECTED Final   Parainfluenza Virus 1 NOT DETECTED NOT DETECTED Final   Parainfluenza Virus 2 NOT DETECTED NOT DETECTED Final   Parainfluenza Virus 3 NOT DETECTED NOT DETECTED Final   Parainfluenza Virus 4 NOT DETECTED NOT DETECTED Final   Respiratory Syncytial Virus NOT DETECTED NOT DETECTED Final   Bordetella pertussis NOT DETECTED NOT DETECTED Final   Bordetella Parapertussis NOT DETECTED NOT DETECTED Final   Chlamydophila pneumoniae NOT DETECTED NOT DETECTED Final   Mycoplasma pneumoniae NOT DETECTED NOT DETECTED Final    Comment: Performed at [facility], [address], [place], [place] [address]    Labs: CBC: Recent Labs  Lab 01/31/23 1342 02/01/23 0206 02/02/23 0551 02/03/23 1520  WBC 9.9 7.7 2.2* 2.6*  NEUTROABS 9.3*  --  1.1* 1.7  HGB 10.3* 9.4* 8.6* 8.7*  HCT 35.1* 31.7* 29.4* 29.8*  MCV 75.5* 76.0* 75.4* 76.4*  PLT 285 204 181 189   Basic Metabolic Panel: Recent Labs  Lab 01/31/23 1342 02/01/23 0206 02/02/23 0551 02/03/23 0615 02/04/23 0415  NA 136 137 137  --   --   K 3.9 3.8 3.6  --   --   CL 105 106 109  --   --   CO2 19* 21* 22  --   --   GLUCOSE 266* 179* 111*  --   --   BUN 16 19 16   --   --   CREATININE 1.35* 1.54* 1.24* 0.99 0.92  CALCIUM 8.9 7.9* 7.9*  --   --    Liver Function Tests: Recent Labs  Lab 01/31/23 1342 02/01/23 0206  AST 23 16  ALT 16 14  ALKPHOS 72 64  BILITOT 0.6 0.7  PROT 7.7 6.6  ALBUMIN 3.4* 3.0*   CBG: Recent Labs  Lab 01/31/23 1332 02/01/23 0029 02/03/23 1607 02/03/23 2108 02/04/23 0820  GLUCAP 224* 144* 123* 154* 120*    Discharge  time spent: greater than 30 minutes.  Signed: Lurene Shadow, MD [facility] 02/04/2023

## 2023-02-04 NOTE — TOC Progression Note (Addendum)
Transition of Care [facility]) - Progression Note    Patient Details  Name: [name] [name] MRN: [id number] Date of Birth: [birth year]  Transition of Care [facility]) CM/SW Contact  Darolyn Rua, [place] Phone Number: 02/04/2023, 10:39 AM  Clinical Narrative:     All home infusion orders faxed to Jomarie Longs (phone: [phone] OR [phone] )  at [facility] home infusion at Fax: [phone]     Expected Discharge Plan: Home w Home Health Services Barriers to Discharge: Continued Medical Work up  Expected Discharge Plan and Services       Living arrangements for the past 2 months: Single Family Home                                       Social Determinants of Health (SDOH) Interventions SDOH Screenings   Food Insecurity: No Food Insecurity (02/01/2023)  Housing: Low Risk  (02/01/2023)  Transportation Needs: No Transportation Needs (02/01/2023)  Utilities: Not At Risk (02/01/2023)  Depression (PHQ2-9): High Risk (10/04/2022)  Financial Resource Strain: Medium Risk (06/02/2022)   Received from [facility]  Stress: Stress Concern Present (04/28/2022)  Tobacco Use: Medium Risk (01/31/2023)    Readmission Risk Interventions    01/05/2022   11:09 AM  Readmission Risk Prevention Plan  Transportation Screening Complete  PCP or Specialist Appt within 3-5 Days Complete  Social Work Consult for Recovery Care Planning/Counseling Complete  Palliative Care Screening Not Applicable  Medication Review Oceanographer) Complete

## 2023-02-05 ENCOUNTER — Encounter: Admit: 2023-02-05 | Discharge: 2023-02-05 | Payer: PRIVATE HEALTH INSURANCE

## 2023-02-05 DIAGNOSIS — A419 Sepsis, unspecified organism: Principal | ICD-10-CM

## 2023-02-05 DIAGNOSIS — M869 Osteomyelitis, unspecified: Principal | ICD-10-CM

## 2023-02-05 LAB — CULTURE, BLOOD (ROUTINE X 2)
Culture: NO GROWTH
Culture: NO GROWTH
Special Requests: ADEQUATE

## 2023-02-05 NOTE — Plan of Care (Signed)
[facility] Tonsillectomy/Adenoidectomy, Postoperative PEDS care plan entered in error.

## 2023-02-11 DIAGNOSIS — M869 Osteomyelitis, unspecified: Principal | ICD-10-CM

## 2023-02-11 DIAGNOSIS — E1169 Type 2 diabetes mellitus with other specified complication: Principal | ICD-10-CM

## 2023-02-14 ENCOUNTER — Other Ambulatory Visit: Payer: Self-pay | Admitting: Psychiatry

## 2023-02-14 ENCOUNTER — Telehealth: Payer: Self-pay

## 2023-02-14 DIAGNOSIS — R059 Cough, unspecified type: Principal | ICD-10-CM

## 2023-02-14 MED ORDER — BUSPIRONE HCL 10 MG PO TABS: 10.0000 mg | ORAL_TABLET | Freq: Two times a day (BID) | ORAL | 1 refills | Status: AC

## 2023-02-14 MED ORDER — FLUOXETINE HCL 40 MG PO CAPS: 40.0000 mg | ORAL_CAPSULE | Freq: Every day | ORAL | 1 refills | Status: DC

## 2023-02-14 MED ORDER — ARIPIPRAZOLE 5 MG PO TABS: 5.0000 mg | ORAL_TABLET | Freq: Every day | ORAL | 1 refills | Status: DC

## 2023-02-14 MED ORDER — BENZONATATE 200 MG CAPSULE
ORAL_CAPSULE | Freq: Three times a day (TID) | ORAL | 0 refills | 10 days | Status: CP | PRN
Start: 2023-02-14 — End: 2023-02-24

## 2023-02-14 NOTE — Telephone Encounter (Signed)
Pt.notified

## 2023-02-14 NOTE — Telephone Encounter (Signed)
received fax requesting a refill on the fluoxetine. pt was last seen on 6-17 next appt 12-3

## 2023-02-14 NOTE — Telephone Encounter (Signed)
Ordered

## 2023-02-14 NOTE — Telephone Encounter (Signed)
received fax requesting a refill on the aripiprazole. pt was last seen on 6-17 next appt 12-3

## 2023-02-14 NOTE — Telephone Encounter (Signed)
received fax requesting a refill on the buspirone. pt last seen on 6-17 next appt 12-3

## 2023-02-15 ENCOUNTER — Telehealth: Payer: Self-pay

## 2023-02-15 ENCOUNTER — Other Ambulatory Visit: Payer: Self-pay | Admitting: Psychiatry

## 2023-02-15 DIAGNOSIS — E782 Mixed hyperlipidemia: Principal | ICD-10-CM

## 2023-02-15 MED ORDER — QUETIAPINE FUMARATE 300 MG PO TABS: 300.0000 mg | ORAL_TABLET | Freq: Every day | ORAL | 1 refills | Status: DC

## 2023-02-15 MED ORDER — ATORVASTATIN 80 MG TABLET
ORAL_TABLET | 1 refills | 0 days | Status: CP
Start: 2023-02-15 — End: 2024-02-15

## 2023-02-15 NOTE — Telephone Encounter (Signed)
received fax requesting a refill on the quetiapine. pt was last seen on 6-17 next appt 12-3

## 2023-02-15 NOTE — Telephone Encounter (Signed)
Pt.notified

## 2023-02-15 NOTE — Telephone Encounter (Signed)
Ordered

## 2023-02-16 ENCOUNTER — Ambulatory Visit: Admit: 2023-02-16 | Payer: PRIVATE HEALTH INSURANCE

## 2023-02-16 ENCOUNTER — Ambulatory Visit
Admit: 2023-02-16 | Discharge: 2023-02-17 | Payer: PRIVATE HEALTH INSURANCE | Attending: Student in an Organized Health Care Education/Training Program | Primary: Student in an Organized Health Care Education/Training Program

## 2023-02-16 ENCOUNTER — Ambulatory Visit
Admit: 2023-02-16 | Discharge: 2023-02-23 | Disposition: A | Discharge status: Home / Self Care | Payer: PRIVATE HEALTH INSURANCE | Admitting: Infectious Disease

## 2023-02-16 ENCOUNTER — Ambulatory Visit: Admit: 2023-02-16 | Discharge: 2023-02-23 | Payer: PRIVATE HEALTH INSURANCE

## 2023-02-16 ENCOUNTER — Ambulatory Visit: Admit: 2023-02-16 | Discharge: 2023-02-17 | Payer: PRIVATE HEALTH INSURANCE

## 2023-02-16 DIAGNOSIS — R0602 Shortness of breath: Principal | ICD-10-CM

## 2023-02-16 DIAGNOSIS — Z9189 Other specified personal risk factors, not elsewhere classified: Principal | ICD-10-CM

## 2023-02-16 DIAGNOSIS — R197 Diarrhea, unspecified: Principal | ICD-10-CM

## 2023-02-16 DIAGNOSIS — Z792 Long term (current) use of antibiotics: Principal | ICD-10-CM

## 2023-02-16 DIAGNOSIS — L03032 Cellulitis of left toe: Principal | ICD-10-CM

## 2023-02-18 ENCOUNTER — Ambulatory Visit: Admit: 2023-02-18 | Payer: PRIVATE HEALTH INSURANCE

## 2023-02-18 ENCOUNTER — Ambulatory Visit: Admit: 2023-02-18 | Payer: PRIVATE HEALTH INSURANCE | Attending: Family | Primary: Family

## 2023-02-21 ENCOUNTER — Inpatient Hospital Stay: Payer: Medicaid Other | Admitting: Pulmonary Disease

## 2023-02-23 MED ORDER — CYCLOBENZAPRINE 10 MG TABLET
ORAL_TABLET | Freq: Three times a day (TID) | ORAL | 0 refills | 5 days | Status: CP | PRN
Start: 2023-02-23 — End: ?

## 2023-02-23 MED ORDER — ONDANSETRON 4 MG DISINTEGRATING TABLET
ORAL_TABLET | Freq: Three times a day (TID) | 0 refills | 4 days | Status: CP | PRN
Start: 2023-02-23 — End: 2023-03-02

## 2023-02-23 MED ORDER — LOPERAMIDE 2 MG CAPSULE
ORAL_CAPSULE | Freq: Four times a day (QID) | ORAL | 0 refills | 8 days | Status: CP | PRN
Start: 2023-02-23 — End: 2023-03-25

## 2023-02-24 ENCOUNTER — Ambulatory Visit
Admit: 2023-02-24 | Discharge: 2023-02-25 | Payer: PRIVATE HEALTH INSURANCE | Attending: Podiatrist | Primary: Podiatrist

## 2023-02-24 ENCOUNTER — Ambulatory Visit: Admit: 2023-02-24 | Discharge: 2023-02-25 | Payer: PRIVATE HEALTH INSURANCE

## 2023-02-24 DIAGNOSIS — M869 Osteomyelitis, unspecified: Principal | ICD-10-CM

## 2023-02-24 DIAGNOSIS — E1169 Type 2 diabetes mellitus with other specified complication: Principal | ICD-10-CM

## 2023-02-24 MED ORDER — HYDROXYCHLOROQUINE 200 MG TABLET
ORAL_TABLET | Freq: Every day | ORAL | 11 refills | 30 days | Status: CP
Start: 2023-02-24 — End: 2024-02-24

## 2023-02-26 MED ORDER — ZINC SULFATE 50 MG ZINC (220 MG) CAPSULE
ORAL_CAPSULE | Freq: Every day | ORAL | 10 refills | 0 days
Start: 2023-02-26 — End: ?

## 2023-02-26 MED ORDER — CALCIUM 315 MG (AS CITRATE)-VITAMIN D3 6.25 MCG (250 UNIT) TABLET
ORAL_TABLET | Freq: Every day | ORAL | 10 refills | 0 days
Start: 2023-02-26 — End: ?

## 2023-02-28 MED ORDER — ZINC GLUCONATE 50 MG TABLET
ORAL_TABLET | Freq: Every day | ORAL | 1 refills | 90 days
Start: 2023-02-28 — End: 2024-02-28

## 2023-02-28 MED ORDER — CALCIUM 315 MG (AS CITRATE)-VITAMIN D3 6.25 MCG (250 UNIT) TABLET
ORAL_TABLET | Freq: Every day | ORAL | 1 refills | 90 days
Start: 2023-02-28 — End: 2024-02-28

## 2023-02-28 MED ORDER — ZINC SULFATE 50 MG ZINC (220 MG) CAPSULE
ORAL_CAPSULE | Freq: Every day | ORAL | 10 refills | 30 days
Start: 2023-02-28 — End: ?

## 2023-03-01 MED ORDER — ZINC SULFATE 50 MG ZINC (220 MG) CAPSULE
ORAL_CAPSULE | Freq: Every day | ORAL | 10 refills | 0 days
Start: 2023-03-01 — End: ?

## 2023-03-02 ENCOUNTER — Encounter: Payer: Self-pay | Admitting: Family Medicine

## 2023-03-02 ENCOUNTER — Other Ambulatory Visit: Payer: Self-pay

## 2023-03-02 ENCOUNTER — Inpatient Hospital Stay
Admission: EM | Admit: 2023-03-02 | Discharge: 2023-03-04 | DRG: 683 | Disposition: A | Discharge status: Home / Self Care | Payer: Medicaid Other | Attending: Internal Medicine | Admitting: Internal Medicine

## 2023-03-02 ENCOUNTER — Ambulatory Visit: Admit: 2023-03-02 | Discharge: 2023-03-03 | Payer: PRIVATE HEALTH INSURANCE

## 2023-03-02 DIAGNOSIS — R197 Diarrhea, unspecified: Principal | ICD-10-CM

## 2023-03-02 DIAGNOSIS — R112 Nausea with vomiting, unspecified: Principal | ICD-10-CM

## 2023-03-02 DIAGNOSIS — Z9842 Cataract extraction status, left eye: Secondary | ICD-10-CM

## 2023-03-02 DIAGNOSIS — K529 Noninfective gastroenteritis and colitis, unspecified: Secondary | ICD-10-CM | POA: Diagnosis not present

## 2023-03-02 DIAGNOSIS — W19XXXA Unspecified fall, initial encounter: Principal | ICD-10-CM

## 2023-03-02 DIAGNOSIS — I5032 Chronic diastolic (congestive) heart failure: Secondary | ICD-10-CM | POA: Diagnosis present

## 2023-03-02 DIAGNOSIS — N319 Neuromuscular dysfunction of bladder, unspecified: Secondary | ICD-10-CM | POA: Diagnosis present

## 2023-03-02 DIAGNOSIS — M069 Rheumatoid arthritis, unspecified: Secondary | ICD-10-CM | POA: Diagnosis present

## 2023-03-02 DIAGNOSIS — N179 Acute kidney failure, unspecified: Secondary | ICD-10-CM | POA: Diagnosis not present

## 2023-03-02 DIAGNOSIS — Z6841 Body Mass Index (BMI) 40.0 and over, adult: Secondary | ICD-10-CM

## 2023-03-02 DIAGNOSIS — I110 Hypertensive heart disease with heart failure: Secondary | ICD-10-CM | POA: Diagnosis present

## 2023-03-02 DIAGNOSIS — E039 Hypothyroidism, unspecified: Secondary | ICD-10-CM | POA: Diagnosis present

## 2023-03-02 DIAGNOSIS — E1142 Type 2 diabetes mellitus with diabetic polyneuropathy: Secondary | ICD-10-CM | POA: Diagnosis present

## 2023-03-02 DIAGNOSIS — E1169 Type 2 diabetes mellitus with other specified complication: Secondary | ICD-10-CM

## 2023-03-02 DIAGNOSIS — F32A Depression, unspecified: Secondary | ICD-10-CM | POA: Diagnosis present

## 2023-03-02 DIAGNOSIS — Z9841 Cataract extraction status, right eye: Secondary | ICD-10-CM

## 2023-03-02 DIAGNOSIS — Z79890 Hormone replacement therapy (postmenopausal): Secondary | ICD-10-CM

## 2023-03-02 DIAGNOSIS — E119 Type 2 diabetes mellitus without complications: Secondary | ICD-10-CM

## 2023-03-02 DIAGNOSIS — Z9884 Bariatric surgery status: Secondary | ICD-10-CM

## 2023-03-02 DIAGNOSIS — Z888 Allergy status to other drugs, medicaments and biological substances status: Secondary | ICD-10-CM

## 2023-03-02 DIAGNOSIS — Z7901 Long term (current) use of anticoagulants: Secondary | ICD-10-CM

## 2023-03-02 DIAGNOSIS — Z961 Presence of intraocular lens: Secondary | ICD-10-CM | POA: Diagnosis present

## 2023-03-02 DIAGNOSIS — I10 Essential (primary) hypertension: Secondary | ICD-10-CM | POA: Diagnosis not present

## 2023-03-02 DIAGNOSIS — E785 Hyperlipidemia, unspecified: Secondary | ICD-10-CM | POA: Diagnosis present

## 2023-03-02 DIAGNOSIS — Z79899 Other long term (current) drug therapy: Secondary | ICD-10-CM

## 2023-03-02 DIAGNOSIS — Z87891 Personal history of nicotine dependence: Secondary | ICD-10-CM

## 2023-03-02 DIAGNOSIS — Z8249 Family history of ischemic heart disease and other diseases of the circulatory system: Secondary | ICD-10-CM

## 2023-03-02 DIAGNOSIS — I480 Paroxysmal atrial fibrillation: Secondary | ICD-10-CM | POA: Diagnosis present

## 2023-03-02 DIAGNOSIS — K219 Gastro-esophageal reflux disease without esophagitis: Secondary | ICD-10-CM | POA: Diagnosis present

## 2023-03-02 DIAGNOSIS — Z7984 Long term (current) use of oral hypoglycemic drugs: Secondary | ICD-10-CM

## 2023-03-02 DIAGNOSIS — E860 Dehydration: Secondary | ICD-10-CM | POA: Diagnosis present

## 2023-03-02 DIAGNOSIS — Z8616 Personal history of COVID-19: Secondary | ICD-10-CM

## 2023-03-02 DIAGNOSIS — Z950 Presence of cardiac pacemaker: Secondary | ICD-10-CM

## 2023-03-02 DIAGNOSIS — F419 Anxiety disorder, unspecified: Secondary | ICD-10-CM | POA: Diagnosis present

## 2023-03-02 DIAGNOSIS — Z885 Allergy status to narcotic agent status: Secondary | ICD-10-CM

## 2023-03-02 DIAGNOSIS — Z882 Allergy status to sulfonamides status: Secondary | ICD-10-CM

## 2023-03-02 LAB — CBC
HCT: 28.4 % — ABNORMAL LOW (ref 36.0–46.0)
Hemoglobin: 8.3 g/dL — ABNORMAL LOW (ref 12.0–15.0)
MCH: 22.8 pg — ABNORMAL LOW (ref 26.0–34.0)
MCHC: 29.2 g/dL — ABNORMAL LOW (ref 30.0–36.0)
MCV: 78 fL — ABNORMAL LOW (ref 80.0–100.0)
Platelets: 245 10*3/uL (ref 150–400)
RBC: 3.64 MIL/uL — ABNORMAL LOW (ref 3.87–5.11)
RDW: 20.7 % — ABNORMAL HIGH (ref 11.5–15.5)
WBC: 6.1 10*3/uL (ref 4.0–10.5)
nRBC: 0 % (ref 0.0–0.2)

## 2023-03-02 LAB — GASTROINTESTINAL PANEL BY PCR, STOOL (REPLACES STOOL CULTURE)

## 2023-03-02 LAB — BASIC METABOLIC PANEL
Anion gap: 8 (ref 5–15)
BUN: 39 mg/dL — ABNORMAL HIGH (ref 8–23)
CO2: 21 mmol/L — ABNORMAL LOW (ref 22–32)
Calcium: 8.6 mg/dL — ABNORMAL LOW (ref 8.9–10.3)
Chloride: 109 mmol/L (ref 98–111)
Creatinine, Ser: 2.47 mg/dL — ABNORMAL HIGH (ref 0.44–1.00)
GFR, Estimated: 22 mL/min — ABNORMAL LOW (ref >60)
Glucose, Bld: 138 mg/dL — ABNORMAL HIGH (ref 70–99)
Potassium: 4.8 mmol/L (ref 3.5–5.1)
Sodium: 138 mmol/L (ref 135–145)

## 2023-03-02 LAB — GLUCOSE, CAPILLARY: Glucose-Capillary: 100 mg/dL — ABNORMAL HIGH (ref 70–99)

## 2023-03-02 LAB — C DIFFICILE QUICK SCREEN W PCR REFLEX
C Diff antigen: NEGATIVE
C Diff interpretation: NOT DETECTED
C Diff toxin: NEGATIVE

## 2023-03-02 MED ORDER — ACETAMINOPHEN 650 MG RE SUPP: 650.0000 mg | Freq: Four times a day (QID) | RECTAL | Status: DC | PRN

## 2023-03-02 MED ORDER — FLUOXETINE HCL 20 MG PO CAPS
40.0000 mg | ORAL_CAPSULE | Freq: Every day | ORAL | Status: DC
Start: 2023-03-03 — End: 2023-03-04
  Administered 2023-03-03 – 2023-03-04 (×2): 40 mg via ORAL
  Filled 2023-03-02 (×2): qty 2

## 2023-03-02 MED ORDER — QUETIAPINE FUMARATE 300 MG PO TABS
300.0000 mg | ORAL_TABLET | Freq: Every day | ORAL | Status: DC
Start: 2023-03-02 — End: 2023-03-04
  Administered 2023-03-02 – 2023-03-03 (×2): 300 mg via ORAL
  Filled 2023-03-02 (×2): qty 1

## 2023-03-02 MED ORDER — PREGABALIN 50 MG PO CAPS
100.0000 mg | ORAL_CAPSULE | Freq: Three times a day (TID) | ORAL | Status: DC
Start: 2023-03-02 — End: 2023-03-04
  Administered 2023-03-02 – 2023-03-04 (×5): 100 mg via ORAL
  Filled 2023-03-02 (×5): qty 2

## 2023-03-02 MED ORDER — HYDROXYCHLOROQUINE SULFATE 200 MG PO TABS
200.0000 mg | ORAL_TABLET | Freq: Two times a day (BID) | ORAL | Status: DC
  Administered 2023-03-02 – 2023-03-04 (×4): 200 mg via ORAL
  Filled 2023-03-02 (×4): qty 1

## 2023-03-02 MED ORDER — BUDESON-GLYCOPYRROL-FORMOTEROL 160-9-4.8 MCG/ACT IN AERO: 2.0000 | INHALATION_SPRAY | Freq: Two times a day (BID) | RESPIRATORY_TRACT | Status: DC

## 2023-03-02 MED ORDER — ATORVASTATIN CALCIUM 20 MG PO TABS
80.0000 mg | ORAL_TABLET | Freq: Every day | ORAL | Status: DC
  Administered 2023-03-03 – 2023-03-04 (×2): 80 mg via ORAL
  Filled 2023-03-02 (×2): qty 4

## 2023-03-02 MED ORDER — ZINC SULFATE 220 (50 ZN) MG PO CAPS
220.0000 mg | ORAL_CAPSULE | Freq: Every day | ORAL | Status: DC
  Administered 2023-03-02 – 2023-03-03 (×2): 220 mg via ORAL
  Filled 2023-03-02 (×3): qty 1

## 2023-03-02 MED ORDER — METHOTREXATE SODIUM 2.5 MG PO TABS: 15.0000 mg | ORAL_TABLET | ORAL | Status: DC

## 2023-03-02 MED ORDER — FLUTICASONE FUROATE-VILANTEROL 200-25 MCG/ACT IN AEPB
1.0000 | INHALATION_SPRAY | Freq: Every day | RESPIRATORY_TRACT | Status: DC
  Administered 2023-03-03 – 2023-03-04 (×2): 1 via RESPIRATORY_TRACT
  Filled 2023-03-02: qty 28

## 2023-03-02 MED ORDER — TRAZODONE HCL 50 MG PO TABS: 25.0000 mg | ORAL_TABLET | Freq: Every evening | ORAL | Status: DC | PRN

## 2023-03-02 MED ORDER — AMLODIPINE BESYLATE 5 MG PO TABS
10.0000 mg | ORAL_TABLET | Freq: Every day | ORAL | Status: DC
Start: 2023-03-03 — End: 2023-03-04
  Administered 2023-03-03 – 2023-03-04 (×2): 10 mg via ORAL
  Filled 2023-03-02 (×2): qty 2

## 2023-03-02 MED ORDER — METOPROLOL TARTRATE 25 MG PO TABS
25.0000 mg | ORAL_TABLET | Freq: Two times a day (BID) | ORAL | Status: DC
  Administered 2023-03-02 – 2023-03-04 (×4): 25 mg via ORAL
  Filled 2023-03-02 (×4): qty 1

## 2023-03-02 MED ORDER — UMECLIDINIUM BROMIDE 62.5 MCG/ACT IN AEPB
1.0000 | INHALATION_SPRAY | Freq: Every day | RESPIRATORY_TRACT | Status: DC
  Administered 2023-03-03 – 2023-03-04 (×2): 1 via RESPIRATORY_TRACT
  Filled 2023-03-02: qty 7

## 2023-03-02 MED ORDER — VITAMIN D 25 MCG (1000 UNIT) PO TABS
5000.0000 [IU] | ORAL_TABLET | Freq: Every day | ORAL | Status: DC
  Administered 2023-03-03 – 2023-03-04 (×2): 5000 [IU] via ORAL
  Filled 2023-03-02 (×2): qty 5

## 2023-03-02 MED ORDER — FOLIC ACID 1 MG PO TABS
1.0000 mg | ORAL_TABLET | Freq: Every day | ORAL | Status: DC
Start: 2023-03-03 — End: 2023-03-04
  Administered 2023-03-03 – 2023-03-04 (×2): 1 mg via ORAL
  Filled 2023-03-02 (×2): qty 1

## 2023-03-02 MED ORDER — METOCLOPRAMIDE HCL 10 MG/10ML PO SOLN
5.0000 mg | Freq: Three times a day (TID) | ORAL | Status: DC
Start: 2023-03-02 — End: 2023-03-04
  Administered 2023-03-03 – 2023-03-04 (×2): 5 mg via ORAL
  Filled 2023-03-02 (×2): qty 10
  Filled 2023-03-02 (×2): qty 5

## 2023-03-02 MED ORDER — VITAMIN C 500 MG PO TABS
500.0000 mg | ORAL_TABLET | Freq: Every day | ORAL | Status: DC
  Administered 2023-03-03 – 2023-03-04 (×2): 500 mg via ORAL
  Filled 2023-03-02 (×2): qty 1

## 2023-03-02 MED ORDER — SODIUM CHLORIDE 0.9 % IV SOLN: INTRAVENOUS | Status: AC

## 2023-03-02 MED ORDER — ONDANSETRON HCL 4 MG/2ML IJ SOLN
4.0000 mg | Freq: Once | INTRAMUSCULAR | Status: AC
  Administered 2023-03-02: 4 mg via INTRAVENOUS
  Filled 2023-03-02: qty 2

## 2023-03-02 MED ORDER — ONDANSETRON HCL 4 MG/2ML IJ SOLN: 4.0000 mg | Freq: Four times a day (QID) | INTRAMUSCULAR | Status: DC | PRN

## 2023-03-02 MED ORDER — ACETAMINOPHEN 325 MG PO TABS: 650.0000 mg | ORAL_TABLET | Freq: Four times a day (QID) | ORAL | Status: DC | PRN

## 2023-03-02 MED ORDER — ARIPIPRAZOLE 10 MG PO TABS
5.0000 mg | ORAL_TABLET | Freq: Every day | ORAL | Status: DC
Start: 2023-03-03 — End: 2023-03-04
  Administered 2023-03-03 – 2023-03-04 (×2): 5 mg via ORAL
  Filled 2023-03-02 (×2): qty 1

## 2023-03-02 MED ORDER — LINAGLIPTIN 5 MG PO TABS
5.0000 mg | ORAL_TABLET | Freq: Every day | ORAL | Status: DC
  Filled 2023-03-02: qty 1

## 2023-03-02 MED ORDER — BUSPIRONE HCL 5 MG PO TABS
10.0000 mg | ORAL_TABLET | Freq: Two times a day (BID) | ORAL | Status: DC
  Administered 2023-03-02 – 2023-03-04 (×4): 10 mg via ORAL
  Filled 2023-03-02 (×4): qty 2

## 2023-03-02 MED ORDER — FAMOTIDINE 20 MG PO TABS
20.0000 mg | ORAL_TABLET | Freq: Every day | ORAL | Status: DC
Start: 2023-03-03 — End: 2023-03-04
  Administered 2023-03-03 – 2023-03-04 (×2): 20 mg via ORAL
  Filled 2023-03-02 (×2): qty 1

## 2023-03-02 MED ORDER — ONDANSETRON HCL 4 MG PO TABS: 4.0000 mg | ORAL_TABLET | Freq: Four times a day (QID) | ORAL | Status: DC | PRN

## 2023-03-02 MED ORDER — OYSTER SHELL CALCIUM/D3 500-5 MG-MCG PO TABS
1.0000 | ORAL_TABLET | Freq: Every day | ORAL | Status: DC
Start: 2023-03-03 — End: 2023-03-04
  Administered 2023-03-03 – 2023-03-04 (×2): 1 via ORAL
  Filled 2023-03-02 (×2): qty 1

## 2023-03-02 MED ORDER — LEVOTHYROXINE SODIUM 88 MCG PO TABS
88.0000 ug | ORAL_TABLET | Freq: Every day | ORAL | Status: DC
  Administered 2023-03-03 – 2023-03-04 (×2): 88 ug via ORAL
  Filled 2023-03-02 (×2): qty 1

## 2023-03-02 MED ORDER — SODIUM CHLORIDE 0.9 % IV BOLUS
1000.0000 mL | Freq: Once | INTRAVENOUS | Status: AC
  Administered 2023-03-02: 1000 mL via INTRAVENOUS

## 2023-03-02 MED ORDER — PRAMIPEXOLE DIHYDROCHLORIDE 0.25 MG PO TABS
0.5000 mg | ORAL_TABLET | Freq: Every day | ORAL | Status: DC
  Administered 2023-03-02 – 2023-03-03 (×2): 0.5 mg via ORAL
  Filled 2023-03-02 (×2): qty 2

## 2023-03-02 MED ORDER — APIXABAN 5 MG PO TABS
5.0000 mg | ORAL_TABLET | Freq: Two times a day (BID) | ORAL | Status: DC
  Administered 2023-03-02 – 2023-03-04 (×4): 5 mg via ORAL
  Filled 2023-03-02 (×4): qty 1

## 2023-03-02 MED ORDER — MAGNESIUM HYDROXIDE 400 MG/5ML PO SUSP: 30.0000 mL | Freq: Every day | ORAL | Status: DC | PRN

## 2023-03-02 NOTE — ED Triage Notes (Addendum)
Arrives from home via [facility].  C?O bilateral leg weakness x 1 week.  Fall today while going to the bathroom.  No head trauma.  No LOC.  Takes Eliquis for Afib. Recently treated for dehydration, has had a several month history of diarrhea.  VS wnl.  Wears 4l/  home oxygen

## 2023-03-02 NOTE — H&P (Incomplete)
PATIENT NAME: [name] [name]    MR#:  [id number]  DATE OF BIRTH:  15-Mar-1961  DATE OF ADMISSION:  03/02/2023  PRIMARY CARE PHYSICIAN: Etheleen Nicks, NP   Patient is coming from: Home  REQUESTING/REFERRING PHYSICIAN: Corena Herter, MD  CHIEF COMPLAINT:   Chief Complaint  Patient presents with   Fall   Weakness    HISTORY OF PRESENT ILLNESS:  [name] [name] is a 62 y.o. female with medical history significant for anxiety, depression, rheumatoid arthritis, diastolic CHF, GERD, hypertension and hypothyroidism, presented to the emergency room with acute onset of generalized weakness and fall with no loss of consciousness or head injury.  The patient admitted to dizziness and vertigo before falling.  She has been having nausea and vomiting this a.m as well as diarrhea.  No fever or chills.  No cough or wheezing or dyspnea.  No chest pain or palpitations.  No dysuria, urinary frequency or urgency or flank pain.  She has been having diminished urine output.  ED Course: When the patient came to the ER, BP was 114/53 and pulse oximetry 93% on 4 L of O2 by nasal cannula and later 96%.  With otherwise normal vital signs.  Labs revealed a BUN of 39 and creatinine 2.47 up from normal creatinine of 0.92 on 02/04/2023.  CBC showed anemia with microcytosis.  C. difficile antigen came back negative as well as toxin. EKG as reviewed by me : EKG showed normal sinus rhythm with a rate of 66. Imaging: None.  The patient was given 4 mg IV Zofran and 1 L bolus of IV normal saline.  She will be admitted to a medical observation bed for further evaluation and management. PAST MEDICAL HISTORY:   Past Medical History:  Diagnosis Date   Abdominal pain 02/12/2019   Abdominal wall hernia 01/29/2013   Acute metabolic encephalopathy 07/08/2019   AMS (altered mental status) 12/28/2021   Anxiety    Arthritis    Rheumatoid   C. difficile colitis    Chronic diastolic heart failure (HCC)     COVID-19 03/23/2019   Diagnosed at [facility] (send-out) on 03/23/2019   Depression    Diabetes mellitus    states no meds or diet restrictions  at present   Diastolic CHF (HCC)    Esophagitis    Fluid retention    GERD (gastroesophageal reflux disease)    Hiatal hernia    Hypertension    Hypokalemia due to loss of potassium 10/21/2015   Overview:  Associated with 3 weeks of diarrhea  And QT prolongation.   Hypothyroidism    IBS (irritable bowel syndrome)    Moderate episode of recurrent major depressive disorder (HCC) 06/03/2004   Morbid obesity (HCC)    MRSA (methicillin resistant Staphylococcus aureus) infection 11/2017   left inner thigh abcess   Neurogenic bladder    has pacemaker   Neuropathy    Obesity    Panic attacks    Pneumonia due to COVID-19 virus    Rheumatoid arthritis (HCC)    Sleep apnea    STATES SEVERE, CANT TOLERATE MASK- LAST STUDY YEARS AGO    PAST SURGICAL HISTORY:   Past Surgical History:  Procedure Laterality Date   ABDOMINAL HYSTERECTOMY     CHOLECYSTECTOMY     DG GREAT TOE RIGHT FOOT  02/23/2018   EYE SURGERY     bilateral cataract extraction with IOL   FOOT SURGERY Left 05/13/2021   [facility]   HERNIA  REPAIR     ventral hernia with strangulation   IRRIGATION AND DEBRIDEMENT FOOT Left 01/11/2020   Procedure: IRRIGATION AND DEBRIDEMENT FOOT;  Surgeon: Gwyneth Revels, DPM;  Location: [facility] ORS;  Service: Podiatry;  Laterality: Left;   LAPAROSCOPIC GASTRIC BANDING  03/20/2007   TEE WITHOUT CARDIOVERSION N/A 07/16/2019   Procedure: TRANSESOPHAGEAL ECHOCARDIOGRAM (TEE);  Surgeon: Debbe Odea, MD;  Location: [facility] ORS;  Service: Cardiovascular;  Laterality: N/A;   TONSILLECTOMY     TUBAL LIGATION      SOCIAL HISTORY:   Social History   Tobacco Use   Smoking status: Former    Current packs/day: 0.00    Average packs/day: 2.0 packs/day for 27.0 years (54.0 ttl pk-yrs)    Types: Cigarettes    Start date: 07/29/1972    Quit date: 07/30/1999     Years since quitting: 23.6   Smokeless tobacco: Never   Tobacco comments:    quit in 2001 2-2.5 a day  Substance Use Topics   Alcohol use: No    FAMILY HISTORY:   Family History  Problem Relation Age of Onset   Heart failure Father    Bipolar disorder Father    Alcohol abuse Father    Anxiety disorder Father    Depression Father    Heart disease Brother    Heart attack Brother 54       MI s/p stents placed   Anxiety disorder Sister    Depression Sister    Anxiety disorder Sister    Depression Sister    Bipolar disorder Sister    Alcohol abuse Sister    Drug abuse Sister    Heart attack Brother     DRUG ALLERGIES:   Allergies  Allergen Reactions   Cefepime Anaphylaxis    lips swell and the break out in blisters.   Ceftriaxone Anaphylaxis    Angioedema while admitted at [facility] 01/12/2023 Tolerated amoxicillin    Cephalexin Hives   Codeine Palpitations, Nausea Only, Nausea And Vomiting, Rash and Shortness Of Breath    "makes heart fly, she gets flushed and passes out"   Doxycycline Rash   Propoxyphene Rash and Shortness Of Breath    Increase heart rate   Sulfa Antibiotics Palpitations, Nausea Only, Shortness Of Breath and Hives    "makes heart fly, she gets flushed and passes out"   Clindamycin Rash    Tongue swelling, oral sores, and Mouth rash   Daptomycin Other (See Comments)    Fever and pulmonary infiltrate   Lovenox [Enoxaparin Sodium] Hives   Hydrocodone Nausea And Vomiting    Hear racing & breaks out into a cold sweat.   Meropenem Rash    Erythematous, hot, pruritic rash over arms, chest, back, abdomen, and face occurred at the end of meropenem infusion on 02/22/18    REVIEW OF SYSTEMS:   ROS As per history of present illness. All pertinent systems were reviewed above. Constitutional, HEENT, cardiovascular, respiratory, GI, GU, musculoskeletal, neuro, psychiatric, endocrine, integumentary and hematologic systems were reviewed and are otherwise  negative/unremarkable except for positive findings mentioned above in the HPI.   MEDICATIONS AT HOME:   Prior to Admission medications   Medication Sig Start Date End Date Taking? Authorizing Provider  acetaminophen (TYLENOL) 325 MG tablet Take 2 tablets (650 mg total) by mouth every 6 (six) hours as needed for mild pain or moderate pain (or Fever >/= 101). 01/15/20   Marguerita Merles Latif, DO  amLODipine (NORVASC) 10 MG tablet Take 10 mg by mouth daily.  12/10/21   [provider]  ARIPiprazole (ABILIFY) 5 MG tablet Take 1 tablet (5 mg total) by mouth daily. 02/14/23 04/15/23  Neysa Hotter, MD  ascorbic acid (VITAMIN C) 500 MG tablet Take 1 tablet (500 mg total) by mouth daily. 05/05/19   Rolly Salter, MD  atorvastatin (LIPITOR) 80 MG tablet Take 80 mg by mouth daily. 12/10/21   [provider]  Budeson-Glycopyrrol-Formoterol (BREZTRI AEROSPHERE) 160-9-4.8 MCG/ACT AERO Inhale 2 puffs into the lungs in the morning and at bedtime. 10/01/21   Salena Saner, MD  busPIRone (BUSPAR) 10 MG tablet Take 1 tablet (10 mg total) by mouth 2 (two) times daily. 02/14/23 04/15/23  Neysa Hotter, MD  Calcium Citrate-Vitamin D3 (GNP CALCIUM CITRATE+D MAXIMUM) 315-6.25 MG-MCG TABS Take 1 tablet by mouth daily. 12/10/21   [provider]  Cholecalciferol (VITAMIN D) 125 MCG (5000 UT) CAPS Take 1 capsule by mouth daily. 11/29/19   [provider]  ELIQUIS 5 MG TABS tablet TAKE 1 TABLET BY MOUTH TWICE DAILY 06/26/21   Salena Saner, MD  famotidine (PEPCID) 20 MG tablet Take 1 tablet (20 mg total) by mouth daily. 05/05/19   Rolly Salter, MD  FLUoxetine (PROZAC) 40 MG capsule Take 1 capsule (40 mg total) by mouth daily. 02/14/23 04/15/23  Neysa Hotter, MD  folic acid (FOLVITE) 1 MG tablet Take 1 mg by mouth daily.     [provider]  hydroxychloroquine (PLAQUENIL) 200 MG tablet Take 200 mg by mouth 2 (two) times daily. 12/10/21   [provider]   levothyroxine (SYNTHROID, LEVOTHROID) 88 MCG tablet Take 88 mcg by mouth daily before breakfast.    [provider]  metFORMIN (GLUCOPHAGE-XR) 500 MG 24 hr tablet Take 500 mg by mouth daily. 12/31/19   [provider]  methotrexate (RHEUMATREX) 2.5 MG tablet Take 15 mg by mouth once a week. Sunday    [provider]  metoCLOPramide (REGLAN) 5 MG/5ML solution Take 5 mg by mouth 4 (four) times daily -  before meals and at bedtime. 12/20/21   [provider]  metoprolol tartrate (LOPRESSOR) 25 MG tablet Take 25 mg by mouth 2 (two) times daily. 10/10/20   [provider]  pramipexole (MIRAPEX) 0.5 MG tablet Take 0.5 mg by mouth at bedtime. 03/23/22   [provider]  pregabalin (LYRICA) 100 MG capsule Take 1 capsule (100 mg total) by mouth 3 (three) times daily. 05/20/22   Edward Jolly, MD  promethazine (PHENERGAN) 12.5 MG tablet Take 12.5 mg by mouth. 02/26/22   [provider]  QUEtiapine (SEROQUEL) 300 MG tablet Take 1 tablet (300 mg total) by mouth at bedtime. TAKE ONE TABLET BY MOUTH AT BEDTIME. 02/15/23 04/16/23  Hisada, Barbee Cough, MD  TRADJENTA 5 MG TABS tablet Take 5 mg by mouth daily. 12/31/19   [provider]  zinc sulfate 220 (50 Zn) MG capsule Take 220 mg by mouth at bedtime.  12/31/19   [provider]      VITAL SIGNS:  Blood pressure 134/60, pulse 74, temperature 98.4 F (36.9 C), resp. rate 18, height 5\' 7"  (1.702 m), weight (!) 158.8 kg, last menstrual period 04/20/2001, SpO2 91%.  PHYSICAL EXAMINATION:  Physical Exam  GENERAL:  62 y.o.-year-old Caucasian female patient lying in the bed with no acute distress.  EYES: Pupils equal, round, reactive to light and accommodation. No scleral icterus. Extraocular muscles intact.  HEENT: Head atraumatic, normocephalic. Oropharynx and nasopharynx clear.  NECK:  Supple, no jugular venous distention.  No thyroid enlargement, no tenderness.  LUNGS: Normal breath sounds  bilaterally, no wheezing, rales,rhonchi or crepitation. No use of accessory muscles of respiration.  CARDIOVASCULAR: Regular rate and rhythm, S1, S2 normal. No murmurs, rubs, or gallops.  ABDOMEN: Soft, nondistended, nontender. Bowel sounds present. No organomegaly or mass.  EXTREMITIES: No pedal edema, cyanosis, or clubbing.  NEUROLOGIC: Cranial nerves II through XII are intact. Muscle strength 5/5 in all extremities. Sensation intact. Gait not checked.  PSYCHIATRIC: The patient is alert and oriented x 3.  Normal affect and good eye contact. SKIN: No obvious rash, lesion, or ulcer.   LABORATORY PANEL:   CBC Recent Labs  Lab 03/02/23 1446  WBC 6.1  HGB 8.3*  HCT 28.4*  PLT 245   ------------------------------------------------------------------------------------------------------------------  Chemistries  Recent Labs  Lab 03/02/23 1446  NA 138  K 4.8  CL 109  CO2 21*  GLUCOSE 138*  BUN 39*  CREATININE 2.47*  CALCIUM 8.6*   ------------------------------------------------------------------------------------------------------------------  Cardiac Enzymes No results for input(s): "TROPONINI" in the last 168 hours. ------------------------------------------------------------------------------------------------------------------  RADIOLOGY:  No results found. MHS IMPRESSION AND PLAN:  Assessment and Plan: * AKI (acute kidney injury) (HCC) - The patient will be admitted to the medical observation bed. - We will continue hydration with IV normal saline. - Will follow BMP. - We will avoid nephrotoxins. - This is likely contributing to her generalized weakness.  Acute gastroenteritis - The patient will be placed on as needed antiemetics and antidiarrheals. - We will continue hydration with IV normal saline as mentioned above.  Dyslipidemia - We will continue statins.  Anxiety and depression - We will continue BuSpar and Prozac as well as  Abilify.  Hypothyroidism - We will continue Synthroid.  Rheumatoid arthritis (HCC) - We will continue Plaquenil and methotrexate.  Paroxysmal atrial fibrillation (HCC) - We will continue Eliquis.  Essential hypertension - We will continue antihypertensive therapy.  Controlled type 2 diabetes mellitus without complication, without long-term current use of insulin (HCC) - We will place the patient on supplemental coverage with NovoLog. - Will hold off metformin.     DVT prophylaxis: Eliquis. Advanced Care Planning:  Code Status: full code.  Family Communication:  The plan of care was discussed in details with the patient (and family). I answered all questions. The patient agreed to proceed with the above mentioned plan. Further management will depend upon hospital course. Disposition Plan: Back to previous home environment Consults called: none.  All the records are reviewed and case discussed with ED provider.  Status is: Observation  I certify that at the time of admission, it is my clinical judgment that the patient will require  hospital care extending less than 2 midnights.                            Dispo: The patient is from: Home              Anticipated d/c is to: Home              Patient currently is not medically stable to d/c.              Difficult to place patient: No  Hannah Beat M.D on 03/03/2023 at 4:09 AM  [facility]   From 7 PM-7 AM, contact night-coverage [email or web address]  CC: Primary care physician; Etheleen Nicks, NP

## 2023-03-02 NOTE — ED Provider Notes (Addendum)
[facility] Provider Note    Event Date/Time   First MD Initiated Contact with Patient 03/02/23 1652     (approximate)   History   Fall and Weakness   HPI  [name] [name] is a 62 y.o. female past medical history significant for 4 L of baseline oxygen use, obesity, atrial fibrillation on Eliquis hypoventilation syndrome, HFpEF, rheumatoid arthritis, recent osteomyelitis on 6 weeks of IV vancomycin then daptomycin, presents to the emergency department with a fall.  Endorses 1 week of generalized weakness.  Significant weakness to both of her legs.  Had a fall today and has been having ongoing diarrhea.  States that she caught herself and did not hit her head.  Denies any pain in her abdomen, head or neck.  Denies any chest pain or shortness of breath.  States that she has been having watery diarrhea that has been copious.  Denies any blood in her stool or black dark tarry stool.  Denies any fever or chills.  Denies dysuria, urinary urgency or frequency.     Physical Exam   Triage Vital Signs: ED Triage Vitals  Encounter Vitals Group     BP 03/02/23 1443 (!) 114/53     Systolic BP Percentile --      Diastolic BP Percentile --      Pulse Rate 03/02/23 1443 63     Resp 03/02/23 1443 18     Temp 03/02/23 1443 98.2 F (36.8 C)     Temp Source 03/02/23 1443 Oral     SpO2 03/02/23 1443 93 %     Weight 03/02/23 1444 (!) 350 lb (158.8 kg)     Height 03/02/23 1444 5\' 7"  (1.702 m)     Head Circumference --      Peak Flow --      Pain Score --      Pain Loc --      Pain Education --      Exclude from Growth Chart --     Most recent vital signs: Vitals:   03/02/23 1443 03/02/23 1845  BP: (!) 114/53 (!) 105/57  Pulse: 63 69  Resp: 18 20  Temp: 98.2 F (36.8 C) 97.9 F (36.6 C)  SpO2: 93% 96%    Physical Exam Constitutional:      Appearance: She is well-developed. She is obese.  HENT:     Head: Atraumatic.  Eyes:     Conjunctiva/sclera:  Conjunctivae normal.  Cardiovascular:     Rate and Rhythm: Regular rhythm.  Pulmonary:     Effort: No respiratory distress.     Comments: On chronic 4 L of oxygen. Abdominal:     General: There is no distension.  Musculoskeletal:        General: Normal range of motion.     Cervical back: Normal range of motion.     Comments: No saddle anesthesia.  5/5 strength bilateral lower extremities.  +2 DP pulses that are equal bilaterally.  Skin:    General: Skin is warm.     Capillary Refill: Capillary refill takes 2 to 3 seconds.  Neurological:     Mental Status: She is alert. Mental status is at baseline.  Psychiatric:        Mood and Affect: Mood normal.     IMPRESSION / MDM / ASSESSMENT AND PLAN / ED COURSE  I reviewed the triage vital signs and the nursing notes.  Differential diagnosis including dehydration, orthostatic hypotension, infectious diarrhea, C. difficile, electrolyte  abnormality  EKG  I, Corena Herter, the attending physician, personally viewed and interpreted this ECG.   Rate: Normal  Rhythm: Normal sinus  Intervals: Normal  ST&T Change: None     LABS (all labs ordered are listed, but only abnormal results are displayed) Labs interpreted as -    Labs Reviewed  BASIC METABOLIC PANEL - Abnormal; Notable for the following components:      Result Value   CO2 21 (*)    Glucose, Bld 138 (*)    BUN 39 (*)    Creatinine, Ser 2.47 (*)    Calcium 8.6 (*)    GFR, Estimated 22 (*)    All other components within normal limits  CBC - Abnormal; Notable for the following components:   RBC 3.64 (*)    Hemoglobin 8.3 (*)    HCT 28.4 (*)    MCV 78.0 (*)    MCH 22.8 (*)    MCHC 29.2 (*)    RDW 20.7 (*)    All other components within normal limits  C DIFFICILE QUICK SCREEN W PCR REFLEX    GASTROINTESTINAL PANEL BY PCR, STOOL (REPLACES STOOL CULTURE)  URINALYSIS, W/ REFLEX TO CULTURE (INFECTION SUSPECTED)  BASIC METABOLIC PANEL  CBC  CBG MONITORING, ED      MDM  Patient found to have significant acute kidney injury with a creatinine elevated to 2.47 from a baseline of 0.9.  Elevated BUN at 39.  Anemia but hemoglobin appears to be at her baseline.  Appears microcytic.  Platelets within normal limits and no leukocytosis.  No findings of traumatic injury.  Found to have acute kidney injury, likely in the setting of dehydration.  Given 1 L of IV fluids.  Plan for GI pathogen panel and C. difficile testing.  C. difficile testing is negative.  GI pathogen panel is in process.  Consulted hospitalist for acute kidney injury in the setting of dehydration and diarrhea.     PROCEDURES:  Critical Care performed: No  Procedures  Patient's presentation is most consistent with acute presentation with potential threat to life or bodily function.   MEDICATIONS ORDERED IN ED: Medications  hydroxychloroquine (PLAQUENIL) tablet 200 mg (has no administration in time range)  methotrexate (RHEUMATREX) tablet 15 mg (has no administration in time range)  amLODipine (NORVASC) tablet 10 mg (has no administration in time range)  atorvastatin (LIPITOR) tablet 80 mg (has no administration in time range)  metoprolol tartrate (LOPRESSOR) tablet 25 mg (has no administration in time range)  ARIPiprazole (ABILIFY) tablet 5 mg (has no administration in time range)  busPIRone (BUSPAR) tablet 10 mg (has no administration in time range)  FLUoxetine (PROZAC) capsule 40 mg (has no administration in time range)  QUEtiapine (SEROQUEL) tablet 300 mg (has no administration in time range)  levothyroxine (SYNTHROID) tablet 88 mcg (has no administration in time range)  linagliptin (TRADJENTA) tablet 5 mg (has no administration in time range)  famotidine (PEPCID) tablet 20 mg (has no administration in time range)  metoCLOPramide (REGLAN) 5 MG/5ML solution 5 mg (has no administration in time range)  apixaban (ELIQUIS) tablet 5 mg (has no administration in time range)   pramipexole (MIRAPEX) tablet 0.5 mg (has no administration in time range)  folic acid (FOLVITE) tablet 1 mg (has no administration in time range)  pregabalin (LYRICA) capsule 100 mg (has no administration in time range)  ascorbic acid (VITAMIN C) tablet 500 mg (has no administration in time range)  Calcium Citrate-Vitamin D3 315-6.25 MG-MCG TABS 1 tablet (  has no administration in time range)  Vitamin D CAPS 1 capsule (has no administration in time range)  zinc sulfate (50mg  elemental zinc) capsule 220 mg (has no administration in time range)  Budeson-Glycopyrrol-Formoterol 160-9-4.8 MCG/ACT AERO 2 puff (has no administration in time range)  0.9 %  sodium chloride infusion (has no administration in time range)  acetaminophen (TYLENOL) tablet 650 mg (has no administration in time range)    Or  acetaminophen (TYLENOL) suppository 650 mg (has no administration in time range)  traZODone (DESYREL) tablet 25 mg (has no administration in time range)  magnesium hydroxide (MILK OF MAGNESIA) suspension 30 mL (has no administration in time range)  ondansetron (ZOFRAN) tablet 4 mg (has no administration in time range)    Or  ondansetron (ZOFRAN) injection 4 mg (has no administration in time range)  sodium chloride 0.9 % bolus 1,000 mL (1,000 mLs Intravenous Bolus 03/02/23 1743)  ondansetron (ZOFRAN) injection 4 mg (4 mg Intravenous Given 03/02/23 1744)    FINAL CLINICAL IMPRESSION(S) / ED DIAGNOSES   Final diagnoses:  Fall, initial encounter  Dehydration  AKI (acute kidney injury) (HCC)  Diarrhea, unspecified type     Rx / DC Orders   ED Discharge Orders     None        Note:  This document was prepared using Dragon voice recognition software and may include unintentional dictation errors.   Corena Herter, MD 03/02/23 1610    Corena Herter, MD 03/02/23 2037

## 2023-03-03 ENCOUNTER — Encounter: Payer: Self-pay | Admitting: Family Medicine

## 2023-03-03 DIAGNOSIS — Z7901 Long term (current) use of anticoagulants: Secondary | ICD-10-CM | POA: Diagnosis not present

## 2023-03-03 DIAGNOSIS — N179 Acute kidney failure, unspecified: Secondary | ICD-10-CM

## 2023-03-03 DIAGNOSIS — Z882 Allergy status to sulfonamides status: Secondary | ICD-10-CM | POA: Diagnosis not present

## 2023-03-03 DIAGNOSIS — E039 Hypothyroidism, unspecified: Secondary | ICD-10-CM | POA: Diagnosis present

## 2023-03-03 DIAGNOSIS — I5032 Chronic diastolic (congestive) heart failure: Secondary | ICD-10-CM | POA: Diagnosis present

## 2023-03-03 DIAGNOSIS — Z9884 Bariatric surgery status: Secondary | ICD-10-CM | POA: Diagnosis not present

## 2023-03-03 DIAGNOSIS — Z79890 Hormone replacement therapy (postmenopausal): Secondary | ICD-10-CM | POA: Diagnosis not present

## 2023-03-03 DIAGNOSIS — K529 Noninfective gastroenteritis and colitis, unspecified: Secondary | ICD-10-CM

## 2023-03-03 DIAGNOSIS — N319 Neuromuscular dysfunction of bladder, unspecified: Secondary | ICD-10-CM | POA: Diagnosis present

## 2023-03-03 DIAGNOSIS — Z8616 Personal history of COVID-19: Secondary | ICD-10-CM | POA: Diagnosis not present

## 2023-03-03 DIAGNOSIS — E785 Hyperlipidemia, unspecified: Secondary | ICD-10-CM | POA: Diagnosis present

## 2023-03-03 DIAGNOSIS — E1142 Type 2 diabetes mellitus with diabetic polyneuropathy: Secondary | ICD-10-CM | POA: Diagnosis present

## 2023-03-03 DIAGNOSIS — Z885 Allergy status to narcotic agent status: Secondary | ICD-10-CM | POA: Diagnosis not present

## 2023-03-03 DIAGNOSIS — E860 Dehydration: Secondary | ICD-10-CM | POA: Diagnosis present

## 2023-03-03 DIAGNOSIS — F419 Anxiety disorder, unspecified: Secondary | ICD-10-CM | POA: Diagnosis present

## 2023-03-03 DIAGNOSIS — Z6841 Body Mass Index (BMI) 40.0 and over, adult: Secondary | ICD-10-CM | POA: Diagnosis not present

## 2023-03-03 DIAGNOSIS — I480 Paroxysmal atrial fibrillation: Secondary | ICD-10-CM | POA: Diagnosis present

## 2023-03-03 DIAGNOSIS — I10 Essential (primary) hypertension: Secondary | ICD-10-CM | POA: Insufficient documentation

## 2023-03-03 DIAGNOSIS — Z7984 Long term (current) use of oral hypoglycemic drugs: Secondary | ICD-10-CM | POA: Diagnosis not present

## 2023-03-03 DIAGNOSIS — Z87891 Personal history of nicotine dependence: Secondary | ICD-10-CM | POA: Diagnosis not present

## 2023-03-03 DIAGNOSIS — F32A Depression, unspecified: Secondary | ICD-10-CM | POA: Diagnosis present

## 2023-03-03 DIAGNOSIS — I110 Hypertensive heart disease with heart failure: Secondary | ICD-10-CM | POA: Diagnosis present

## 2023-03-03 DIAGNOSIS — Z8249 Family history of ischemic heart disease and other diseases of the circulatory system: Secondary | ICD-10-CM | POA: Diagnosis not present

## 2023-03-03 DIAGNOSIS — M069 Rheumatoid arthritis, unspecified: Secondary | ICD-10-CM | POA: Diagnosis present

## 2023-03-03 LAB — CBC
HCT: 25.2 % — ABNORMAL LOW (ref 36.0–46.0)
Hemoglobin: 7.6 g/dL — ABNORMAL LOW (ref 12.0–15.0)
MCH: 22.8 pg — ABNORMAL LOW (ref 26.0–34.0)
MCHC: 30.2 g/dL (ref 30.0–36.0)
MCV: 75.4 fL — ABNORMAL LOW (ref 80.0–100.0)
Platelets: 203 10*3/uL (ref 150–400)
RBC: 3.34 MIL/uL — ABNORMAL LOW (ref 3.87–5.11)
RDW: 20.6 % — ABNORMAL HIGH (ref 11.5–15.5)
WBC: 4.3 10*3/uL (ref 4.0–10.5)
nRBC: 0 % (ref 0.0–0.2)

## 2023-03-03 LAB — BASIC METABOLIC PANEL
Anion gap: 7 (ref 5–15)
BUN: 38 mg/dL — ABNORMAL HIGH (ref 8–23)
CO2: 23 mmol/L (ref 22–32)
Calcium: 8.7 mg/dL — ABNORMAL LOW (ref 8.9–10.3)
Chloride: 112 mmol/L — ABNORMAL HIGH (ref 98–111)
Creatinine, Ser: 2.38 mg/dL — ABNORMAL HIGH (ref 0.44–1.00)
GFR, Estimated: 23 mL/min — ABNORMAL LOW (ref >60)
Glucose, Bld: 88 mg/dL (ref 70–99)
Potassium: 4.6 mmol/L (ref 3.5–5.1)
Sodium: 142 mmol/L (ref 135–145)

## 2023-03-03 LAB — GLUCOSE, CAPILLARY
Glucose-Capillary: 87 mg/dL (ref 70–99)
Glucose-Capillary: 96 mg/dL (ref 70–99)
Glucose-Capillary: 148 mg/dL — ABNORMAL HIGH (ref 70–99)
Glucose-Capillary: 105 mg/dL — ABNORMAL HIGH (ref 70–99)

## 2023-03-03 MED ORDER — INSULIN ASPART 100 UNIT/ML IJ SOLN: 0.0000 [IU] | Freq: Every day | INTRAMUSCULAR | Status: DC

## 2023-03-03 MED ORDER — INSULIN ASPART 100 UNIT/ML IJ SOLN
0.0000 [IU] | Freq: Three times a day (TID) | INTRAMUSCULAR | Status: DC
  Administered 2023-03-03: 1 [IU] via SUBCUTANEOUS
  Filled 2023-03-03: qty 1

## 2023-03-03 NOTE — Assessment & Plan Note (Signed)
-   We will continue Eliquis. 

## 2023-03-03 NOTE — Consult Note (Signed)
WOC Nurse Consult Note: Reason for Consult: abrasions to breasts  Wound type: Intertriginous dermatitis underneath B breasts and pannus  ICD-10 CM Codes for Irritant Dermatitis  L30.4  - Erythema intertrigo. Also used for abrasion of the hand, chafing of the skin, dermatitis due to sweating and friction, friction dermatitis, friction eczema, and genital/thigh intertrigo.  Pressure Injury POA: NA  Measurement: widespread erythema with areas of partial thickness skin loss underneath breasts R greater than left; also noted underneath pannus to lesser degree  Wound bed: as above  Drainage (amount, consistency, odor) serosanguinous and minimal tan exudate  Periwound: erythema  Dressing procedure/placement/frequency:  Cleanse under bilateral breasts with soap and water, dry and apply Interdry [employer] # [phone] Measure and cut length of InterDry to fit in skin folds that have skin breakdown Tuck InterDry fabric into skin folds in a single layer, allow for 2 inches of overhang from skin edges to allow for wicking to occur May remove to bathe; dry area thoroughly and then tuck into affected areas again Do not apply any creams or ointments when using InterDry DO NOT THROW AWAY FOR 5 DAYS unless soiled with stool DO NOT [facility] product, this will inactivate the silver in the material  New sheet of Interdry should be applied after 5 days of use if patient continues to have skin breakdown    2.  Cleanse underneath pannus with soap and water, apply floor stock antifungal powder (green and white label Microguard).   POC discussed with patient and bedside nurse. WOC team will not follow. Re-consult if further needs arise.   Thank you,    Priscella Mann MSN, RN-BC, [employer] [phone]

## 2023-03-03 NOTE — Assessment & Plan Note (Signed)
-   We will continue BuSpar and Prozac as well as Abilify.

## 2023-03-03 NOTE — Assessment & Plan Note (Signed)
-   The patient will be admitted to the medical observation bed. - We will continue hydration with IV normal saline. - Will follow BMP. - We will avoid nephrotoxins. - This is likely contributing to her generalized weakness.

## 2023-03-03 NOTE — Assessment & Plan Note (Signed)
-   We will continue anti-hypertensive therapy. 

## 2023-03-03 NOTE — Plan of Care (Signed)

## 2023-03-03 NOTE — Assessment & Plan Note (Addendum)
-  We will continue Plaquenil and methotrexate.

## 2023-03-03 NOTE — Progress Notes (Signed)
PROGRESS NOTE    [name] [name]  ZOX:[id number] DOB: Sep 03, 1960 DOA: 03/02/2023 PCP: Etheleen Nicks, NP   Assessment & Plan:   Principal Problem:   AKI (acute kidney injury) (HCC) Active Problems:   Acute gastroenteritis   Dyslipidemia   Anxiety and depression   Hypothyroidism   Rheumatoid arthritis (HCC)   Controlled type 2 diabetes mellitus without complication, without long-term current use of insulin (HCC)   Essential hypertension   Paroxysmal atrial fibrillation (HCC)  Assessment and Plan: AKI: continue on IVFs. Avoid nephrotoxic meds. Cr is trending down slightly from day prior    Acute gastroenteritis: zofran prn. Continue on IVFs. Continue w/ supportive care    HLD: continue on statin    Depression: severity unknown. Continue on home dose of abilify, buspirone, fluoxetine    Hypothyroidism: continue on home dose of levothyroxine    RA: continue on home dose of plaquenil, methotrexate    PAF: continue on metoprolol, eliquis    HTN: continue on metoprolol, amlodipine,    DM2: HbA1c 8.1, poorly controlled. Continue SSI w/ accuchecks   Morbidly obese: BMI 54.8. Complicates overall care & prognosis   DVT prophylaxis: eliquis Code Status: full  Family Communication:  Disposition Plan: likely d/c back home   Level of care: Med-Surg Status is: Inpatient Remains inpatient appropriate because: severity of illness      Consultants:    Procedures:  Antimicrobials:  Subjective: Pt c/o malaise   Objective: Vitals:   03/02/23 1845 03/02/23 2051 03/03/23 0651 03/03/23 0811  BP: (!) 105/57 134/60 (!) 113/93 (!) 128/53  Pulse: 69 74 60 68  Resp: 20 18 17 18   Temp: 97.9 F (36.6 C) 98.4 F (36.9 C) 98 F (36.7 C) 97.8 F (36.6 C)  TempSrc: Oral  Oral Oral  SpO2: 96% 91% 96% 95%  Weight:      Height:        Intake/Output Summary (Last 24 hours) at 03/03/2023 0836 Last data filed at 03/03/2023 0641 Gross per 24 hour  Intake --  Output 400 ml   Net -400 ml   Filed Weights   03/02/23 1444  Weight: (!) 158.8 kg    Examination:  General exam: Appears calm and comfortable  Respiratory system: Clear to auscultation. Respiratory effort normal. Cardiovascular system: S1 & S2+.  No rubs, gallops or clicks.  Gastrointestinal system: Abdomen is obese, soft and nontender. Normal bowel sounds heard. Central nervous system: Alert and oriented. Moves all extremities Psychiatry: Judgement and insight appear normal. Mood & affect appropriate.     Data Reviewed: I have personally reviewed following labs and imaging studies  CBC: Recent Labs  Lab 03/02/23 1446 03/03/23 0417  WBC 6.1 4.3  HGB 8.3* 7.6*  HCT 28.4* 25.2*  MCV 78.0* 75.4*  PLT 245 203   Basic Metabolic Panel: Recent Labs  Lab 03/02/23 1446 03/03/23 0417  NA 138 142  K 4.8 4.6  CL 109 112*  CO2 21* 23  GLUCOSE 138* 88  BUN 39* 38*  CREATININE 2.47* 2.38*  CALCIUM 8.6* 8.7*   GFR: Estimated Creatinine Clearance: 39.4 mL/min (A) (by C-G formula based on SCr of 2.38 mg/dL (H)). Liver Function Tests: No results for input(s): "AST", "ALT", "ALKPHOS", "BILITOT", "PROT", "ALBUMIN" in the last 168 hours. No results for input(s): "LIPASE", "AMYLASE" in the last 168 hours. No results for input(s): "AMMONIA" in the last 168 hours. Coagulation Profile: No results for input(s): "INR", "PROTIME" in the last 168 hours. Cardiac Enzymes: No results for input(s): "  CKTOTAL", "CKMB", "CKMBINDEX", "TROPONINI" in the last 168 hours. BNP (last 3 results) No results for input(s): "PROBNP" in the last 8760 hours. HbA1C: No results for input(s): "HGBA1C" in the last 72 hours. CBG: Recent Labs  Lab 03/02/23 2325 03/03/23 0809  GLUCAP 100* 87   Lipid Profile: No results for input(s): "CHOL", "HDL", "LDLCALC", "TRIG", "CHOLHDL", "LDLDIRECT" in the last 72 hours. Thyroid Function Tests: No results for input(s): "TSH", "T4TOTAL", "FREET4", "T3FREE", "THYROIDAB" in the last  72 hours. Anemia Panel: No results for input(s): "VITAMINB12", "FOLATE", "FERRITIN", "TIBC", "IRON", "RETICCTPCT" in the last 72 hours. Sepsis Labs: No results for input(s): "PROCALCITON", "LATICACIDVEN" in the last 168 hours.  Recent Results (from the past 240 hour(s))  C Difficile Quick Screen w PCR reflex     Status: None   Collection Time: 03/02/23  5:46 PM   Specimen: STOOL  Result Value Ref Range Status   C Diff antigen NEGATIVE NEGATIVE Final   C Diff toxin NEGATIVE NEGATIVE Final   C Diff interpretation No C. difficile detected.  Final    Comment: Performed at [facility], [address] [place], [place] [address]  Gastrointestinal Panel by PCR , Stool     Status: None   Collection Time: 03/02/23  5:46 PM   Specimen: STOOL  Result Value Ref Range Status   Campylobacter species NOT DETECTED NOT DETECTED Final   Plesimonas shigelloides NOT DETECTED NOT DETECTED Final   Salmonella species NOT DETECTED NOT DETECTED Final   Yersinia enterocolitica NOT DETECTED NOT DETECTED Final   Vibrio species NOT DETECTED NOT DETECTED Final   Vibrio cholerae NOT DETECTED NOT DETECTED Final   Enteroaggregative E coli (EAEC) NOT DETECTED NOT DETECTED Final   Enteropathogenic E coli (EPEC) NOT DETECTED NOT DETECTED Final   Enterotoxigenic E coli (ETEC) NOT DETECTED NOT DETECTED Final   Shiga like toxin producing E coli (STEC) NOT DETECTED NOT DETECTED Final   Shigella/Enteroinvasive E coli (EIEC) NOT DETECTED NOT DETECTED Final   Cryptosporidium NOT DETECTED NOT DETECTED Final   Cyclospora cayetanensis NOT DETECTED NOT DETECTED Final   Entamoeba histolytica NOT DETECTED NOT DETECTED Final   Giardia lamblia NOT DETECTED NOT DETECTED Final   Adenovirus F40/41 NOT DETECTED NOT DETECTED Final   Astrovirus NOT DETECTED NOT DETECTED Final   Norovirus GI/GII NOT DETECTED NOT DETECTED Final   Rotavirus A NOT DETECTED NOT DETECTED Final   Sapovirus (I, II, IV, and V) NOT DETECTED NOT  DETECTED Final    Comment: Performed at [facility], [address], [place], [place] [address]         Radiology Studies: No results found.      Scheduled Meds:  amLODipine  10 mg Oral Daily   apixaban  5 mg Oral BID   ARIPiprazole  5 mg Oral Daily   ascorbic acid  500 mg Oral Daily   atorvastatin  80 mg Oral Daily   busPIRone  10 mg Oral BID   calcium-vitamin D  1 tablet Oral Daily   cholecalciferol  5,000 Units Oral Daily   famotidine  20 mg Oral Daily   FLUoxetine  40 mg Oral Daily   fluticasone furoate-vilanterol  1 puff Inhalation Daily   And   umeclidinium bromide  1 puff Inhalation Daily   folic acid  1 mg Oral Daily   hydroxychloroquine  200 mg Oral BID   insulin aspart  0-5 Units Subcutaneous QHS   insulin aspart  0-9 Units Subcutaneous TID WC  levothyroxine  88 mcg Oral Q0600   linagliptin  5 mg Oral Daily   [START ON 03/06/2023] methotrexate  15 mg Oral Weekly   metoCLOPramide  5 mg Oral TID AC & HS   metoprolol tartrate  25 mg Oral BID   pramipexole  0.5 mg Oral QHS   pregabalin  100 mg Oral TID   QUEtiapine  300 mg Oral QHS   zinc sulfate (50mg  elemental zinc)  220 mg Oral QHS   Continuous Infusions:  sodium chloride 40 mL/hr at 03/02/23 2337     LOS: 0 days     Charise Killian, MD [facility] Pager [phone]  If 7PM-7AM, please contact night-coverage [email or web address] 03/03/2023, 8:36 AM

## 2023-03-03 NOTE — Assessment & Plan Note (Signed)
-   We will continue statins.

## 2023-03-03 NOTE — Plan of Care (Signed)

## 2023-03-03 NOTE — Assessment & Plan Note (Signed)
-   We will place the patient on supplemental coverage with NovoLog. - Will hold off metformin.

## 2023-03-03 NOTE — Assessment & Plan Note (Signed)
-   We will continue Synthroid. 

## 2023-03-03 NOTE — Assessment & Plan Note (Addendum)
-   The patient will be placed on as needed antiemetics and antidiarrheals. - We will continue hydration with IV normal saline as mentioned above.

## 2023-03-04 DIAGNOSIS — N179 Acute kidney failure, unspecified: Secondary | ICD-10-CM | POA: Diagnosis not present

## 2023-03-04 LAB — CBC
HCT: 25.3 % — ABNORMAL LOW (ref 36.0–46.0)
Hemoglobin: 7.4 g/dL — ABNORMAL LOW (ref 12.0–15.0)
MCH: 22.6 pg — ABNORMAL LOW (ref 26.0–34.0)
MCHC: 29.2 g/dL — ABNORMAL LOW (ref 30.0–36.0)
MCV: 77.4 fL — ABNORMAL LOW (ref 80.0–100.0)
Platelets: 218 10*3/uL (ref 150–400)
RBC: 3.27 MIL/uL — ABNORMAL LOW (ref 3.87–5.11)
RDW: 20.5 % — ABNORMAL HIGH (ref 11.5–15.5)
WBC: 4.1 10*3/uL (ref 4.0–10.5)
nRBC: 0 % (ref 0.0–0.2)

## 2023-03-04 LAB — BASIC METABOLIC PANEL
Anion gap: 8 (ref 5–15)
BUN: 32 mg/dL — ABNORMAL HIGH (ref 8–23)
CO2: 20 mmol/L — ABNORMAL LOW (ref 22–32)
Calcium: 8.6 mg/dL — ABNORMAL LOW (ref 8.9–10.3)
Chloride: 113 mmol/L — ABNORMAL HIGH (ref 98–111)
Creatinine, Ser: 2.15 mg/dL — ABNORMAL HIGH (ref 0.44–1.00)
GFR, Estimated: 26 mL/min — ABNORMAL LOW (ref >60)
Glucose, Bld: 102 mg/dL — ABNORMAL HIGH (ref 70–99)
Potassium: 4.5 mmol/L (ref 3.5–5.1)
Sodium: 141 mmol/L (ref 135–145)

## 2023-03-04 LAB — GLUCOSE, CAPILLARY
Glucose-Capillary: 96 mg/dL (ref 70–99)
Glucose-Capillary: 96 mg/dL (ref 70–99)

## 2023-03-04 NOTE — Progress Notes (Signed)
Pt discharged home in stable condition. Discharge instructions given. Pt verbalized understanding. No immediate questions or concerns at this time.  

## 2023-03-04 NOTE — Discharge Summary (Signed)
Physician Discharge Summary  [name] [name] ZOX:[id number] DOB: 1961-01-31 DOA: 03/02/2023  PCP: Etheleen Nicks, NP  Admit date: 03/02/2023 Discharge date: 03/04/2023  Admitted From: home  Disposition:  home   Recommendations for Outpatient Follow-up:  Follow up with PCP w/in 1 week  Needs BMP to check Cr/GFR  Home Health: no  Equipment/Devices:  Discharge Condition: stable  CODE STATUS: full  Diet recommendation: Heart Healthy / Carb Modified  Brief/Interim Summary: HPI was taken from Dr. Arville Care: [name] [name] is a 62 y.o. female with medical history significant for anxiety, depression, rheumatoid arthritis, diastolic CHF, GERD, hypertension and hypothyroidism, presented to the emergency room with acute onset of generalized weakness and fall with no loss of consciousness or head injury.  The patient admitted to dizziness and vertigo before falling.  She has been having nausea and vomiting this a.m as well as diarrhea.  No fever or chills.  No cough or wheezing or dyspnea.  No chest pain or palpitations.  No dysuria, urinary frequency or urgency or flank pain.  She has been having diminished urine output.   ED Course: When the patient came to the ER, BP was 114/53 and pulse oximetry 93% on 4 L of O2 by nasal cannula and later 96%.  With otherwise normal vital signs.  Labs revealed a BUN of 39 and creatinine 2.47 up from normal creatinine of 0.92 on 02/04/2023.  CBC showed anemia with microcytosis.  C. difficile antigen came back negative as well as toxin. EKG as reviewed by me : EKG showed normal sinus rhythm with a rate of 66. Imaging: None.   The patient was given 4 mg IV Zofran and 1 L bolus of IV normal saline.  She will be admitted to a medical observation bed for further evaluation and management.   Discharge Diagnoses:  Principal Problem:   AKI (acute kidney injury) (HCC) Active Problems:   Acute gastroenteritis   Dyslipidemia   Anxiety and depression    Hypothyroidism   Rheumatoid arthritis (HCC)   Controlled type 2 diabetes mellitus without complication, without long-term current use of insulin (HCC)   Essential hypertension   Paroxysmal atrial fibrillation (HCC)  AKI: Cr is trending down again today. Continue on IVFs. Avoid nephrotoxic meds. No previous hx of CKD as per pt    Acute gastroenteritis:  much improved. Zofran prn. Continue on IVFs. Continue w/ supportive care    HLD: continue on statin    Depression: severity unknown. Continue on home dose of abilify, buspirone, fluoxetine    Hypothyroidism: continue on home dose of levothyroxine    RA: continue on home dose of plaquenil, methotrexate    PAF: continue on metoprolol, eliquis    HTN: continue on metoprolol, amlodipine   DM2: HbA1c 8.1, poorly controlled. Continue SSI w/ accuchecks   Morbidly obese: BMI 54.8. Complicates overall care & prognosis   Discharge Instructions  Discharge Instructions     Diet - low sodium heart healthy   Complete by: As directed    Diet Carb Modified   Complete by: As directed    Discharge instructions   Complete by: As directed    F/u w/ PCP w/in 1 week. Needs BMP to check Cr/GFR.   Discharge wound care:   Complete by: As directed    Wound care  Every shift      Comments: 1.Cleanse under bilateral breasts with soap and water, dry and apply Interdry [employer] # [phone] Measure and cut length of InterDry to fit  in skin folds that have skin breakdown Tuck InterDry fabric into skin folds in a single layer, allow for 2 inches of overhang from skin edges to allow for wicking to occur May remove to bathe; dry area thoroughly and then tuck into affected areas again Do not apply any creams or ointments when using InterDry DO NOT THROW AWAY FOR 5 DAYS unless soiled with stool DO NOT [facility] product, this will inactivate the silver in the material  New sheet of Interdry should be applied after 5 days of use if patient continues to have skin  breakdown   2.  Cleanse underneath pannus with soap and water, dry thoroughly and apply floor stock antifungal powder (green and white label Microguard) to entire area.   Increase activity slowly   Complete by: As directed       Allergies as of 03/04/2023       Reactions   Cefepime Anaphylaxis   lips swell and the break out in blisters.   Ceftriaxone Anaphylaxis   Angioedema while admitted at [facility] 01/12/2023 Tolerated amoxicillin    Cephalexin Hives   Codeine Palpitations, Nausea Only, Nausea And Vomiting, Rash, Shortness Of Breath   "makes heart fly, she gets flushed and passes out"   Doxycycline Rash   Propoxyphene Rash, Shortness Of Breath   Increase heart rate   Sulfa Antibiotics Palpitations, Nausea Only, Shortness Of Breath, Hives   "makes heart fly, she gets flushed and passes out"   Clindamycin Rash   Tongue swelling, oral sores, and Mouth rash   Daptomycin Other (See Comments)   Fever and pulmonary infiltrate   Lovenox [enoxaparin Sodium] Hives   Hydrocodone Nausea And Vomiting   Hear racing & breaks out into a cold sweat.   Meropenem Rash   Erythematous, hot, pruritic rash over arms, chest, back, abdomen, and face occurred at the end of meropenem infusion on 02/22/18        Medication List     TAKE these medications    acetaminophen 325 MG tablet Commonly known as: TYLENOL Take 2 tablets (650 mg total) by mouth every 6 (six) hours as needed for mild pain or moderate pain (or Fever >/= 101).   amLODipine 10 MG tablet Commonly known as: NORVASC Take 10 mg by mouth daily.   ARIPiprazole 5 MG tablet Commonly known as: ABILIFY Take 1 tablet (5 mg total) by mouth daily.   ascorbic acid 500 MG tablet Commonly known as: VITAMIN C Take 1 tablet (500 mg total) by mouth daily.   atorvastatin 80 MG tablet Commonly known as: LIPITOR Take 80 mg by mouth daily.   Breztri Aerosphere 160-9-4.8 MCG/ACT Aero Generic drug: Budeson-Glycopyrrol-Formoterol Inhale 2  puffs into the lungs in the morning and at bedtime.   busPIRone 10 MG tablet Commonly known as: BUSPAR Take 1 tablet (10 mg total) by mouth 2 (two) times daily.   Eliquis 5 MG Tabs tablet Generic drug: apixaban TAKE 1 TABLET BY MOUTH TWICE DAILY   famotidine 20 MG tablet Commonly known as: PEPCID Take 1 tablet (20 mg total) by mouth daily.   FLUoxetine 40 MG capsule Commonly known as: PROzac Take 1 capsule (40 mg total) by mouth daily.   folic acid 1 MG tablet Commonly known as: FOLVITE Take 1 mg by mouth daily.   GNP Calcium Citrate+D Maximum 315-6.25 MG-MCG Tabs Generic drug: Calcium Citrate-Vitamin D3 Take 1 tablet by mouth daily.   hydroxychloroquine 200 MG tablet Commonly known as: PLAQUENIL Take 200 mg by mouth 2 (  two) times daily.   levothyroxine 88 MCG tablet Commonly known as: SYNTHROID Take 88 mcg by mouth daily before breakfast.   metFORMIN 500 MG 24 hr tablet Commonly known as: GLUCOPHAGE-XR Take 500 mg by mouth daily.   methotrexate 2.5 MG tablet Commonly known as: RHEUMATREX Take 15 mg by mouth once a week. Sunday   metoCLOPramide 5 MG/5ML solution Commonly known as: REGLAN Take 5 mg by mouth 4 (four) times daily -  before meals and at bedtime.   metoprolol tartrate 25 MG tablet Commonly known as: LOPRESSOR Take 25 mg by mouth 2 (two) times daily.   pramipexole 0.5 MG tablet Commonly known as: MIRAPEX Take 0.5 mg by mouth at bedtime.   pregabalin 100 MG capsule Commonly known as: Lyrica Take 1 capsule (100 mg total) by mouth 3 (three) times daily.   promethazine 12.5 MG tablet Commonly known as: PHENERGAN Take 12.5 mg by mouth.   QUEtiapine 300 MG tablet Commonly known as: SEROQUEL Take 1 tablet (300 mg total) by mouth at bedtime. TAKE ONE TABLET BY MOUTH AT BEDTIME.   Tradjenta 5 MG Tabs tablet Generic drug: linagliptin Take 5 mg by mouth daily.   Vitamin D 125 MCG (5000 UT) Caps Take 1 capsule by mouth daily.   zinc sulfate  (50mg  elemental zinc) 220 (50 Zn) MG capsule Take 220 mg by mouth at bedtime.               Discharge Care Instructions  (From admission, onward)           Start     Ordered   03/04/23 0000  Discharge wound care:       Comments: Wound care  Every shift      Comments: 1.Cleanse under bilateral breasts with soap and water, dry and apply Interdry [employer] # [phone] Measure and cut length of InterDry to fit in skin folds that have skin breakdown Tuck InterDry fabric into skin folds in a single layer, allow for 2 inches of overhang from skin edges to allow for wicking to occur May remove to bathe; dry area thoroughly and then tuck into affected areas again Do not apply any creams or ointments when using InterDry DO NOT THROW AWAY FOR 5 DAYS unless soiled with stool DO NOT [facility] product, this will inactivate the silver in the material  New sheet of Interdry should be applied after 5 days of use if patient continues to have skin breakdown   2.  Cleanse underneath pannus with soap and water, dry thoroughly and apply floor stock antifungal powder (green and white label Microguard) to entire area.   03/04/23 1316            Allergies  Allergen Reactions   Cefepime Anaphylaxis    lips swell and the break out in blisters.   Ceftriaxone Anaphylaxis    Angioedema while admitted at [facility] 01/12/2023 Tolerated amoxicillin    Cephalexin Hives   Codeine Palpitations, Nausea Only, Nausea And Vomiting, Rash and Shortness Of Breath    "makes heart fly, she gets flushed and passes out"   Doxycycline Rash   Propoxyphene Rash and Shortness Of Breath    Increase heart rate   Sulfa Antibiotics Palpitations, Nausea Only, Shortness Of Breath and Hives    "makes heart fly, she gets flushed and passes out"   Clindamycin Rash    Tongue swelling, oral sores, and Mouth rash   Daptomycin Other (See Comments)    Fever and pulmonary infiltrate  Lovenox [Enoxaparin Sodium] Hives   Hydrocodone  Nausea And Vomiting    Hear racing & breaks out into a cold sweat.   Meropenem Rash    Erythematous, hot, pruritic rash over arms, chest, back, abdomen, and face occurred at the end of meropenem infusion on 02/22/18    Consultations:    Procedures/Studies: CT CHEST WO CONTRAST  Result Date: 02/02/2023 CLINICAL DATA:  Pneumonia, complication suspected, xray done EXAM: CT CHEST WITHOUT CONTRAST TECHNIQUE: Multidetector CT imaging of the chest was performed following the standard protocol without IV contrast. RADIATION DOSE REDUCTION: This exam was performed according to the departmental dose-optimization program which includes automated exposure control, adjustment of the mA and/or kV according to patient size and/or use of iterative reconstruction technique. COMPARISON:  Chest x-ray 02/01/2023.  Chest CT 07/29/2020 FINDINGS: Cardiovascular: Heart is normal size. Aorta is normal caliber. Mediastinum/Nodes: No mediastinal, hilar, or axillary adenopathy. Trachea and esophagus are unremarkable. Thyroid unremarkable. Lungs/Pleura: Dependent and basilar atelectasis or scarring on the right. Interstitial thickening and ground-glass opacities in the upper lobes, likely scarring/chronic lung disease. No areas of confluent consolidation or effusions. Upper Abdomen: No acute findings Musculoskeletal: Chest wall soft tissues are unremarkable. No acute bony abnormality. IMPRESSION: Interstitial thickening and ground-glass opacities in the upper lobes, likely scarring/chronic lung disease. Dependent and basilar atelectasis or scarring on the right Electronically Signed   By: Charlett Nose M.D.   On: 02/02/2023 21:07   (Echo, Carotid, EGD, Colonoscopy, ERCP)    Subjective: Pt c/o malaise    Discharge Exam: Vitals:   03/04/23 0235 03/04/23 0849  BP: (!) 100/46 118/73  Pulse: 66 66  Resp: 18 18  Temp: 98 F (36.7 C) 98.2 F (36.8 C)  SpO2: 92% 100%   Vitals:   03/03/23 1713 03/03/23 1937 03/04/23 0235  03/04/23 0849  BP: 118/66 (!) 108/59 (!) 100/46 118/73  Pulse: 63 64 66 66  Resp: 18 18 18 18   Temp: 98.1 F (36.7 C) 98.6 F (37 C) 98 F (36.7 C) 98.2 F (36.8 C)  TempSrc: Oral Oral Oral Oral  SpO2: 97% 94% 92% 100%  Weight:      Height:        General: Pt is alert, awake, not in acute distress Cardiovascular: S1/S2 +, no rubs, no gallops Respiratory: CTA bilaterally, no wheezing, no rhonchi Abdominal: Soft, NT, obese, bowel sounds + Extremities: no edema, no cyanosis    The results of significant diagnostics from this hospitalization (including imaging, microbiology, ancillary and laboratory) are listed below for reference.     Microbiology: Recent Results (from the past 240 hour(s))  C Difficile Quick Screen w PCR reflex     Status: None   Collection Time: 03/02/23  5:46 PM   Specimen: STOOL  Result Value Ref Range Status   C Diff antigen NEGATIVE NEGATIVE Final   C Diff toxin NEGATIVE NEGATIVE Final   C Diff interpretation No C. difficile detected.  Final    Comment: Performed at [facility], [address] [place], [place] [address]  Gastrointestinal Panel by PCR , Stool     Status: None   Collection Time: 03/02/23  5:46 PM   Specimen: STOOL  Result Value Ref Range Status   Campylobacter species NOT DETECTED NOT DETECTED Final   Plesimonas shigelloides NOT DETECTED NOT DETECTED Final   Salmonella species NOT DETECTED NOT DETECTED Final   Yersinia enterocolitica NOT DETECTED NOT DETECTED Final   Vibrio species NOT DETECTED NOT DETECTED Final   Vibrio  cholerae NOT DETECTED NOT DETECTED Final   Enteroaggregative E coli (EAEC) NOT DETECTED NOT DETECTED Final   Enteropathogenic E coli (EPEC) NOT DETECTED NOT DETECTED Final   Enterotoxigenic E coli (ETEC) NOT DETECTED NOT DETECTED Final   Shiga like toxin producing E coli (STEC) NOT DETECTED NOT DETECTED Final   Shigella/Enteroinvasive E coli (EIEC) NOT DETECTED NOT DETECTED Final   Cryptosporidium NOT  DETECTED NOT DETECTED Final   Cyclospora cayetanensis NOT DETECTED NOT DETECTED Final   Entamoeba histolytica NOT DETECTED NOT DETECTED Final   Giardia lamblia NOT DETECTED NOT DETECTED Final   Adenovirus F40/41 NOT DETECTED NOT DETECTED Final   Astrovirus NOT DETECTED NOT DETECTED Final   Norovirus GI/GII NOT DETECTED NOT DETECTED Final   Rotavirus A NOT DETECTED NOT DETECTED Final   Sapovirus (I, II, IV, and V) NOT DETECTED NOT DETECTED Final    Comment: Performed at [facility], [address] [place], [place] [address]     Labs: BNP (last 3 results) Recent Labs    02/02/23 0551  BNP 34.2   Basic Metabolic Panel: Recent Labs  Lab 03/02/23 1446 03/03/23 0417 03/04/23 0919  NA 138 142 141  K 4.8 4.6 4.5  CL 109 112* 113*  CO2 21* 23 20*  GLUCOSE 138* 88 102*  BUN 39* 38* 32*  CREATININE 2.47* 2.38* 2.15*  CALCIUM 8.6* 8.7* 8.6*   Liver Function Tests: No results for input(s): "AST", "ALT", "ALKPHOS", "BILITOT", "PROT", "ALBUMIN" in the last 168 hours. No results for input(s): "LIPASE", "AMYLASE" in the last 168 hours. No results for input(s): "AMMONIA" in the last 168 hours. CBC: Recent Labs  Lab 03/02/23 1446 03/03/23 0417 03/04/23 0919  WBC 6.1 4.3 4.1  HGB 8.3* 7.6* 7.4*  HCT 28.4* 25.2* 25.3*  MCV 78.0* 75.4* 77.4*  PLT 245 203 218   Cardiac Enzymes: No results for input(s): "CKTOTAL", "CKMB", "CKMBINDEX", "TROPONINI" in the last 168 hours. BNP: Invalid input(s): "POCBNP" CBG: Recent Labs  Lab 03/03/23 1114 03/03/23 1711 03/03/23 2105 03/04/23 0847 03/04/23 1125  GLUCAP 96 148* 105* 96 96   D-Dimer No results for input(s): "DDIMER" in the last 72 hours. Hgb A1c No results for input(s): "HGBA1C" in the last 72 hours. Lipid Profile No results for input(s): "CHOL", "HDL", "LDLCALC", "TRIG", "CHOLHDL", "LDLDIRECT" in the last 72 hours. Thyroid function studies No results for input(s): "TSH", "T4TOTAL", "T3FREE", "THYROIDAB" in the  last 72 hours.  Invalid input(s): "FREET3" Anemia work up No results for input(s): "VITAMINB12", "FOLATE", "FERRITIN", "TIBC", "IRON", "RETICCTPCT" in the last 72 hours. Urinalysis    Component Value Date/Time   COLORURINE AMBER (A) 01/31/2023 1523   APPEARANCEUR CLOUDY (A) 01/31/2023 1523   APPEARANCEUR Clear 11/30/2013 1010   LABSPEC 1.024 01/31/2023 1523   LABSPEC 1.015 11/30/2013 1010   PHURINE 5.0 01/31/2023 1523   GLUCOSEU NEGATIVE 01/31/2023 1523   GLUCOSEU >=500 11/30/2013 1010   HGBUR NEGATIVE 01/31/2023 1523   BILIRUBINUR NEGATIVE 01/31/2023 1523   BILIRUBINUR Negative 11/30/2013 1010   KETONESUR 5 (A) 01/31/2023 1523   PROTEINUR 100 (A) 01/31/2023 1523   UROBILINOGEN 0.2 03/14/2007 1000   NITRITE NEGATIVE 01/31/2023 1523   LEUKOCYTESUR LARGE (A) 01/31/2023 1523   LEUKOCYTESUR Negative 11/30/2013 1010   Sepsis Labs Recent Labs  Lab 03/02/23 1446 03/03/23 0417 03/04/23 0919  WBC 6.1 4.3 4.1   Microbiology Recent Results (from the past 240 hour(s))  C Difficile Quick Screen w PCR reflex     Status: None   Collection Time:  03/02/23  5:46 PM   Specimen: STOOL  Result Value Ref Range Status   C Diff antigen NEGATIVE NEGATIVE Final   C Diff toxin NEGATIVE NEGATIVE Final   C Diff interpretation No C. difficile detected.  Final    Comment: Performed at [facility], [address] [place], [place] [address]  Gastrointestinal Panel by PCR , Stool     Status: None   Collection Time: 03/02/23  5:46 PM   Specimen: STOOL  Result Value Ref Range Status   Campylobacter species NOT DETECTED NOT DETECTED Final   Plesimonas shigelloides NOT DETECTED NOT DETECTED Final   Salmonella species NOT DETECTED NOT DETECTED Final   Yersinia enterocolitica NOT DETECTED NOT DETECTED Final   Vibrio species NOT DETECTED NOT DETECTED Final   Vibrio cholerae NOT DETECTED NOT DETECTED Final   Enteroaggregative E coli (EAEC) NOT DETECTED NOT DETECTED Final   Enteropathogenic E  coli (EPEC) NOT DETECTED NOT DETECTED Final   Enterotoxigenic E coli (ETEC) NOT DETECTED NOT DETECTED Final   Shiga like toxin producing E coli (STEC) NOT DETECTED NOT DETECTED Final   Shigella/Enteroinvasive E coli (EIEC) NOT DETECTED NOT DETECTED Final   Cryptosporidium NOT DETECTED NOT DETECTED Final   Cyclospora cayetanensis NOT DETECTED NOT DETECTED Final   Entamoeba histolytica NOT DETECTED NOT DETECTED Final   Giardia lamblia NOT DETECTED NOT DETECTED Final   Adenovirus F40/41 NOT DETECTED NOT DETECTED Final   Astrovirus NOT DETECTED NOT DETECTED Final   Norovirus GI/GII NOT DETECTED NOT DETECTED Final   Rotavirus A NOT DETECTED NOT DETECTED Final   Sapovirus (I, II, IV, and V) NOT DETECTED NOT DETECTED Final    Comment: Performed at [facility], [address], [place], [place] [address]     Time coordinating discharge: Over 30 minutes  SIGNED:   Charise Killian, MD  [facility] 03/04/2023, 1:16 PM Pager   If 7PM-7AM, please contact night-coverage

## 2023-03-04 NOTE — Plan of Care (Signed)
  Problem: Education: Goal: Knowledge of General Education information will improve Description: Including pain rating scale, medication(s)/side effects and non-pharmacologic comfort measures Outcome: Adequate for Discharge   Problem: Health Behavior/Discharge Planning: Goal: Ability to manage health-related needs will improve Outcome: Adequate for Discharge   Problem: Clinical Measurements: Goal: Ability to maintain clinical measurements within normal limits will improve Outcome: Adequate for Discharge Goal: Will remain free from infection Outcome: Adequate for Discharge Goal: Diagnostic test results will improve Outcome: Adequate for Discharge Goal: Respiratory complications will improve Outcome: Adequate for Discharge Goal: Cardiovascular complication will be avoided Outcome: Adequate for Discharge   Problem: Activity: Goal: Risk for activity intolerance will decrease Outcome: Adequate for Discharge   Problem: Nutrition: Goal: Adequate nutrition will be maintained Outcome: Adequate for Discharge   Problem: Coping: Goal: Level of anxiety will decrease Outcome: Adequate for Discharge   Problem: Elimination: Goal: Will not experience complications related to bowel motility Outcome: Adequate for Discharge Goal: Will not experience complications related to urinary retention Outcome: Adequate for Discharge   Problem: Pain Management: Goal: General experience of comfort will improve Outcome: Adequate for Discharge   Problem: Safety: Goal: Ability to remain free from injury will improve Outcome: Adequate for Discharge   Problem: Skin Integrity: Goal: Risk for impaired skin integrity will decrease Outcome: Adequate for Discharge   Problem: Education: Goal: Ability to describe self-care measures that may prevent or decrease complications (Diabetes Survival Skills Education) will improve Outcome: Adequate for Discharge Goal: Individualized Educational Video(s) Outcome:  Adequate for Discharge   Problem: Coping: Goal: Ability to adjust to condition or change in health will improve Outcome: Adequate for Discharge   Problem: Fluid Volume: Goal: Ability to maintain a balanced intake and output will improve Outcome: Adequate for Discharge   Problem: Health Behavior/Discharge Planning: Goal: Ability to identify and utilize available resources and services will improve Outcome: Adequate for Discharge Goal: Ability to manage health-related needs will improve Outcome: Adequate for Discharge   Problem: Metabolic: Goal: Ability to maintain appropriate glucose levels will improve Outcome: Adequate for Discharge   Problem: Nutritional: Goal: Maintenance of adequate nutrition will improve Outcome: Adequate for Discharge Goal: Progress toward achieving an optimal weight will improve Outcome: Adequate for Discharge   Problem: Skin Integrity: Goal: Risk for impaired skin integrity will decrease Outcome: Adequate for Discharge   Problem: Tissue Perfusion: Goal: Adequacy of tissue perfusion will improve Outcome: Adequate for Discharge

## 2023-03-04 NOTE — TOC Transition Note (Signed)
Transition of Care [facility]) - CM/SW Discharge Note   Patient Details  Name: [name] [name] MRN: [id number] Date of Birth: 01-04-1961  [facility] CM/SW Contact:  Margarito Liner, LCSW Phone Number: 03/04/2023, 1:36 PM   Clinical Narrative:  Patient has orders to discharge home today. No further concerns. CSW signing off.   Final next level of care: Home/Self Care Barriers to Discharge: Barriers Resolved   Patient Goals and CMS Choice      Discharge Placement                  Patient to be transferred to facility by: Aunt or mother   Patient and family notified of of transfer: 03/04/23  Discharge Plan and Services Additional resources added to the After Visit Summary for       Post Acute Care Choice: NA                               Social Determinants of Health (SDOH) Interventions SDOH Screenings   Food Insecurity: No Food Insecurity (03/02/2023)  Housing: Low Risk  (03/02/2023)  Transportation Needs: No Transportation Needs (03/02/2023)  Utilities: Not At Risk (03/02/2023)  Depression (PHQ2-9): High Risk (10/04/2022)  Financial Resource Strain: Medium Risk (06/02/2022)   [facility]  Stress: Stress Concern Present (04/28/2022)  Tobacco Use: Medium Risk (03/03/2023)     Readmission Risk Interventions    03/04/2023   11:07 AM 01/05/2022   11:09 AM  Readmission Risk Prevention Plan  Transportation Screening Complete Complete  PCP or Specialist Appt within 3-5 Days  Complete  Social Work Consult for Recovery Care Planning/Counseling  Complete  Palliative Care Screening  Not Applicable  Medication Review Oceanographer) Complete Complete  PCP or Specialist appointment within 3-5 days of discharge Complete   SW Recovery Care/Counseling Consult Complete   Palliative Care Screening Not Applicable   Skilled Nursing Facility Not Applicable

## 2023-03-04 NOTE — Plan of Care (Signed)
  Problem: Education: Goal: Knowledge of General Education information will improve Description Including pain rating scale, medication(s)/side effects and non-pharmacologic comfort measures Outcome: Progressing   

## 2023-03-04 NOTE — TOC Initial Note (Signed)
Transition of Care [facility]) - Initial/Assessment Note    Patient Details  Name: [name] [name] MRN: [id number] Date of Birth: [birth year]  Transition of Care [facility]) CM/SW Contact:    Margarito Liner, LCSW Phone Number: 03/04/2023, 11:09 AM  Clinical Narrative:  Readmission prevention screen complete. CSW met with patient. No supports at bedside. CSW introduced role and explained that discharge planning would be discussed. PCP is Etheleen Nicks, NP. Patient drives herself to appointments. She [facility] in [place] and [facility] [address] [facility] [place]. [facility] puts her medications in pill packs and [facility] them in bottles. No issues obtaining medications. Patient lives home with her daughter. No home health prior to admission. She was recently discharged from home health after completing IV abx on 11/3. Patient uses a RW and rollator at home as needed. She also has a BSC and shower chair. Patient confirmed she is on 4 L chronic oxygen through [employer]. No further concerns. CSW encouraged patient to contact CSW as needed. CSW will continue to follow patient for support and facilitate return home once stable. Patient stated she should be discharging later today. Her aunt or mother will transport her home and bring her oxygen.                Expected Discharge Plan: Home/Self Care Barriers to Discharge: Continued Medical Work up   Patient Goals and CMS Choice            Expected Discharge Plan and Services     Post Acute Care Choice: NA Living arrangements for the past 2 months: Single Family Home                                      Prior Living Arrangements/Services Living arrangements for the past 2 months: Single Family Home Lives with:: Adult Children Patient language and need for interpreter reviewed:: Yes Do you feel safe going back to the place where you live?: Yes      Need for Family Participation in Patient Care: Yes (Comment) Care giver  support system in place?: Yes (comment) Current home services: DME Criminal Activity/Legal Involvement Pertinent to Current Situation/Hospitalization: No - Comment as needed  Activities of Daily Living   ADL Screening (condition at time of admission) Independently performs ADLs?: Yes (appropriate for developmental age) Is the patient deaf or have difficulty hearing?: No Does the patient have difficulty seeing, even when wearing glasses/contacts?: No Does the patient have difficulty concentrating, remembering, or making decisions?: No  Permission Sought/Granted                  Emotional Assessment Appearance:: Appears stated age Attitude/Demeanor/Rapport: Engaged, Gracious Affect (typically observed): Accepting, Appropriate, Calm, Pleasant Orientation: : Oriented to Self, Oriented to Place, Oriented to  Time, Oriented to Situation Alcohol / Substance Use: Not Applicable Psych Involvement: No (comment)  Admission diagnosis:  Dehydration [E86.0] AKI (acute kidney injury) (HCC) [N17.9] Fall, initial encounter [W19.XXXA] Diarrhea, unspecified type [R19.7] Patient Active Problem List   Diagnosis Date Noted   Acute gastroenteritis 03/03/2023   Essential hypertension 03/03/2023   Dyslipidemia 03/03/2023   Paroxysmal atrial fibrillation (HCC) 03/03/2023   AKI (acute kidney injury) (HCC) 03/02/2023   Diabetic infection of left foot (HCC) 02/02/2023   Community acquired pneumonia 02/02/2023   Acute respiratory failure with hypoxia (HCC) 02/01/2023   Multifocal pneumonia 02/01/2023   Acute on chronic diastolic  CHF (congestive heart failure) (HCC) 02/01/2023   Acute UTI 01/31/2023   MRSA infection 01/13/2023   Gastroparesis 02/28/2022   Severe sepsis (HCC) 01/04/2022   Depression with anxiety    PAF (paroxysmal atrial fibrillation) (HCC)    Pulmonary fibrosis (HCC) 02/07/2020   Chronic respiratory failure with hypoxia (HCC) 02/07/2020   Anemia 02/07/2020   Morbid obesity with  BMI of 50.0-59.9, adult (HCC) 07/08/2019   Controlled type 2 diabetes mellitus without complication, without long-term current use of insulin (HCC) 04/06/2019   CKD stage 3b, GFR 30-44 ml/min (HCC) 04/06/2019   HLD (hyperlipidemia) 04/06/2019   Abdominal pain 02/12/2019   Diabetic ulcer of left foot (HCC) 02/14/2018   Chronic pain syndrome 03/31/2016   Rheumatoid arthritis (HCC) 12/25/2015   Osteoarthritis, multiple sites 12/24/2015   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[employer]tron[employer]) 12/23/2015   Long term prescription opiate use 12/23/2015   Diabetic peripheral neuropathy (HCC) 12/23/2015   COPD (chronic obstructive pulmonary disease) (HCC) 01/03/2014   Bipolar disorder, unspecified (HCC) 01/03/2014   Borderline personality disorder (HCC) 01/06/2012   Anxiety and depression 01/06/2012   Nausea & vomiting 08/04/2011   Hypothyroidism 06/28/2010   Rheumatoid arthritis involving multiple sites with positive rheumatoid factor (HCC) 06/28/2010   Essential (primary) hypertension 01/27/2005   Major depressive disorder, recurrent episode, moderate (HCC) 06/03/2004   PCP:  Etheleen Nicks, NP Pharmacy:   [phone] [place], [place] [facility] [address] [place] [address] Phone: [phone] Fax: [phone]  [facility] - [place], [place] - [address] [place] [address] Phone: [phone] Fax: [phone]     Social Determinants of Health (SDOH) Social History: SDOH Screenings   Food Insecurity: No Food Insecurity (03/02/2023)  Housing: Low Risk  (03/02/2023)  Transportation Needs: No Transportation Needs (03/02/2023)  Utilities: Not At Risk (03/02/2023)  Depression (PHQ2-9): High Risk (10/04/2022)  Financial Resource Strain: Medium Risk (06/02/2022)   [facility] from [facility]  Stress: Stress Concern Present (04/28/2022)  Tobacco Use: Medium Risk (03/03/2023)   SDOH Interventions:      Readmission Risk Interventions    03/04/2023   11:07 AM 01/05/2022   11:09 AM  Readmission Risk Prevention Plan  Transportation Screening Complete Complete  PCP or Specialist Appt within 3-5 Days  Complete  Social Work Consult for Recovery Care Planning/Counseling  Complete  Palliative Care Screening  Not Applicable  Medication Review [facility]) Complete Complete  PCP or Specialist appointment within 3-5 days of discharge Complete   SW Recovery Care/Counseling Consult Complete   Palliative Care Screening Not Applicable   Skilled Nursing Facility Not Applicable

## 2023-03-07 DIAGNOSIS — M0579 Rheumatoid arthritis with rheumatoid factor of multiple sites without organ or systems involvement: Principal | ICD-10-CM

## 2023-03-07 MED ORDER — FOLIC ACID 1 MG TABLET
ORAL_TABLET | Freq: Every day | ORAL | 3 refills | 90 days | Status: CP
Start: 2023-03-07 — End: ?

## 2023-03-08 MED ORDER — ZINC GLUCONATE 50 MG TABLET
ORAL_TABLET | Freq: Every day | ORAL | 1 refills | 90 days | Status: CP
Start: 2023-03-08 — End: 2024-03-07

## 2023-03-08 MED ORDER — CALCIUM 315 MG (AS CITRATE)-VITAMIN D3 6.25 MCG (250 UNIT) TABLET
ORAL_TABLET | Freq: Every day | ORAL | 1 refills | 90 days | Status: CP
Start: 2023-03-08 — End: 2024-03-07

## 2023-03-08 MED ORDER — ZINC SULFATE 50 MG ZINC (220 MG) CAPSULE
ORAL_CAPSULE | Freq: Every day | ORAL | 10 refills | 30 days
Start: 2023-03-08 — End: ?

## 2023-03-16 ENCOUNTER — Ambulatory Visit
Admit: 2023-03-16 | Discharge: 2023-03-17 | Payer: PRIVATE HEALTH INSURANCE | Attending: Student in an Organized Health Care Education/Training Program | Primary: Student in an Organized Health Care Education/Training Program

## 2023-03-16 DIAGNOSIS — I4891 Unspecified atrial fibrillation: Principal | ICD-10-CM

## 2023-03-16 DIAGNOSIS — D649 Anemia, unspecified: Principal | ICD-10-CM

## 2023-03-16 DIAGNOSIS — N179 Acute kidney failure, unspecified: Principal | ICD-10-CM

## 2023-03-16 DIAGNOSIS — R197 Diarrhea, unspecified: Principal | ICD-10-CM

## 2023-03-16 DIAGNOSIS — E1169 Type 2 diabetes mellitus with other specified complication: Principal | ICD-10-CM

## 2023-03-16 DIAGNOSIS — I5030 Unspecified diastolic (congestive) heart failure: Principal | ICD-10-CM

## 2023-03-21 ENCOUNTER — Encounter: Payer: Self-pay | Admitting: Pulmonary Disease

## 2023-03-21 ENCOUNTER — Ambulatory Visit: Payer: Medicaid Other | Admitting: Pulmonary Disease

## 2023-03-21 VITALS — BP 110/60 | HR 57 | Temp 97.1°F | Ht 67.0 in | Wt 328.0 lb

## 2023-03-21 DIAGNOSIS — J9611 Chronic respiratory failure with hypoxia: Secondary | ICD-10-CM

## 2023-03-21 DIAGNOSIS — R0602 Shortness of breath: Secondary | ICD-10-CM

## 2023-03-21 DIAGNOSIS — M069 Rheumatoid arthritis, unspecified: Secondary | ICD-10-CM

## 2023-03-21 DIAGNOSIS — J8410 Pulmonary fibrosis, unspecified: Secondary | ICD-10-CM | POA: Diagnosis not present

## 2023-03-21 DIAGNOSIS — E662 Morbid (severe) obesity with alveolar hypoventilation: Secondary | ICD-10-CM

## 2023-03-21 DIAGNOSIS — J4489 Other specified chronic obstructive pulmonary disease: Secondary | ICD-10-CM

## 2023-03-21 MED ORDER — ALBUTEROL SULFATE HFA 108 (90 BASE) MCG/ACT IN AERS: 2.0000 | INHALATION_SPRAY | Freq: Four times a day (QID) | RESPIRATORY_TRACT | 2 refills | Status: DC | PRN

## 2023-03-21 NOTE — Progress Notes (Signed)
Subjective:    Patient ID: [name] [name], female    DOB: August 04, 1960, 62 y.o.   MRN: [id number]  Patient Care Team: Noralyn Pick, NP as PCP - General  Chief Complaint  Patient presents with   Follow-up    DOE. Dizziness. No wheezing or cough.     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  Patient also has COPD with asthmatic bronchitis and extreme obesity with obesity hypoventilation.  This is a scheduled visit.  Patient was last seen during hospitalization on 03 February 2023.  Since that hospitalization of October she was admitted again in November 13 through the 15th after a fall.  She was noted to have AKI at that time.  She presents today for a scheduled visit.  HPI Discussed the use of AI scribe software for clinical note transcription with the patient, who gave verbal consent to proceed.  History of Present Illness   [name] [name], a patient with a complex medical history including obesity hypoventilation, COPD with asthmatic bronchitis, and multiple other comorbidities, presents for a scheduled visit. The primary concern is dyspnea, which is likely related to her obesity and underlying respiratory conditions.  The patient reports feeling consistently out of breath and experiencing dizziness. She recently had a hospital admission due to pneumonia, which may be contributing to her current symptoms. The patient is on Eliquis, which was not initially listed on her medication list.  The patient reports poor sleep quality since being taken off one of her sleeping medications. She does not snore, and a sleep study conducted a year ago indicated that she required oxygen while sleeping but showed no overt sleep apnea.  She has  hypoventilation due to obesity  The patient is using Breztri for her COPD, but does not have a rescue inhaler. She received a flu shot during her recent hospital stay. She is supposed to be on diuretics, but currently does not have any.  She indicates that her primary care physician is arranging for evaluation by cardiology.  She is noted to have grade 2 diastolic dysfunction on most recent echo performed 17 February 2023 at [facility].  The patient has not noticed any increased swelling in her legs. 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 The patient also requires supplies for her oxygen therapy, including cannulas and a new hose extension.      DATA 07/08/2019 echocardiogram: LVEF 60 to 65%, grade 1 DD. 01/23/2020 PFTs: FEV1 2.19 L or 78% predicted, FVC 2.59 L or 71% predicted, FEV1/FVC 84%, no bronchodilator response.  ERV 10% lung volumes otherwise normal diffusion capacity mildly to moderately impaired. 11/03/2021 echocardiogram: LVEF 55 to 60%, grade 1 DD, LA size mildly to moderately dilated. 03/17/2022 PFTs: FEV1 1.61 L or 58% predicted, FVC 2.05 L or 57% of predicted FEV1/FVC 79% there is response to bronchodilators with regards to airway resistance.  Lung volumes moderately reduced, moderate to severe diffusion defect 02/17/2023 echocardiogram [facility]): LVEF 65% grade 2 diastolic dysfunction (elevated filling pressure) moderately thickened aortic valve without aortic stenosis.  Right ventricle normal in size with normal systolic function.  Review of Systems A 10 point review of systems was performed and it is as noted  above otherwise negative.   Patient Active Problem List   Diagnosis Date Noted   Acute gastroenteritis 03/03/2023   Essential hypertension 03/03/2023   Dyslipidemia 03/03/2023   Paroxysmal atrial fibrillation (HCC) 03/03/2023   AKI (acute kidney injury) (HCC) 03/02/2023   Diabetic infection of left  foot (HCC) 02/02/2023   Community acquired pneumonia 02/02/2023   Acute respiratory failure with hypoxia (HCC) 02/01/2023   Multifocal pneumonia 02/01/2023   Acute on chronic diastolic CHF (congestive heart failure) (HCC) 02/01/2023   Acute UTI 01/31/2023   MRSA infection 01/13/2023   Gastroparesis 02/28/2022   Severe sepsis (HCC) 01/04/2022   Depression with anxiety    PAF (paroxysmal atrial fibrillation) (HCC)    Pulmonary fibrosis (HCC) 02/07/2020   Chronic respiratory failure with hypoxia (HCC) 02/07/2020   Anemia 02/07/2020   Morbid obesity with BMI of 50.0-59.9, adult (HCC) 07/08/2019   Controlled type 2 diabetes mellitus without complication, without long-term current use of insulin (HCC) 04/06/2019   CKD stage 3b, GFR 30-44 ml/min (HCC) 04/06/2019   HLD (hyperlipidemia) 04/06/2019   Abdominal pain 02/12/2019   Diabetic ulcer of left foot (HCC) 02/14/2018   Chronic pain syndrome 03/31/2016   Rheumatoid arthritis (HCC) 12/25/2015   Osteoarthritis, multiple sites 12/24/2015   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[employer]) 12/23/2015   Long term prescription opiate use 12/23/2015   Diabetic peripheral neuropathy (HCC) 12/23/2015   COPD (chronic obstructive pulmonary disease) (HCC) 01/03/2014   Bipolar disorder, unspecified (HCC) 01/03/2014   Borderline personality disorder (HCC) 01/06/2012   Anxiety and depression 01/06/2012   Nausea & vomiting 08/04/2011   Hypothyroidism 06/28/2010   Rheumatoid arthritis involving multiple sites with positive rheumatoid factor (HCC) 06/28/2010   Essential (primary) hypertension 01/27/2005   Major depressive disorder, recurrent episode, moderate (HCC) 06/03/2004    Social History   Tobacco Use   Smoking status: Former    Current packs/day: 0.00    Average packs/day: 2.0 packs/day for 27.0 years (54.0 ttl pk-yrs)    Types: Cigarettes    Start date: 07/29/1972    Quit date: 07/30/1999    Years since quitting: 23.6    Smokeless tobacco: Never   Tobacco comments:    quit in 2001 2-2.5 a day  Substance Use Topics   Alcohol use: No    Allergies  Allergen Reactions   Cefepime Anaphylaxis and Swelling    lips swell and the break out in blisters.  States lip swelling upon receiving it   Ceftriaxone Anaphylaxis    Angioedema while admitted at [facility] 01/12/2023 Tolerated amoxicillin    Cephalexin Hives   Codeine Palpitations, Nausea Only, Nausea And Vomiting, Rash and Shortness Of Breath    "makes heart fly, she gets flushed and passes out"   Doxycycline Rash   Propoxyphene Rash and Shortness Of Breath    Increase heart rate   Sulfa Antibiotics Palpitations, Nausea Only, Shortness Of Breath and Hives    "makes heart fly, she gets flushed and passes out"   Clindamycin Rash    Tongue swelling, oral sores, and Mouth rash   Daptomycin Other (See Comments)    Fever and pulmonary infiltrate   Lovenox [Enoxaparin Sodium] Hives   Hydrocodone Nausea And Vomiting    Hear racing & breaks out into a cold sweat.   Meropenem Rash    Erythematous, hot, pruritic rash over arms, chest, back, abdomen, and face occurred at the end of meropenem infusion on 02/22/18    Current Meds  Medication Sig   acetaminophen (TYLENOL) 325  MG tablet Take 2 tablets (650 mg total) by mouth every 6 (six) hours as needed for mild pain or moderate pain (or Fever >/= 101).   amLODipine (NORVASC) 10 MG tablet Take 10 mg by mouth daily.   ARIPiprazole (ABILIFY) 5 MG tablet Take 1 tablet (5 mg total) by mouth daily.   ascorbic acid (VITAMIN C) 500 MG tablet Take 1 tablet (500 mg total) by mouth daily.   atorvastatin (LIPITOR) 80 MG tablet Take 80 mg by mouth daily.   Budeson-Glycopyrrol-Formoterol (BREZTRI AEROSPHERE) 160-9-4.8 MCG/ACT AERO Inhale 2 puffs into the lungs in the morning and at bedtime.   busPIRone (BUSPAR) 10 MG tablet Take 1 tablet (10 mg total) by mouth 2 (two) times daily.   Calcium Citrate-Vitamin D3 (GNP CALCIUM CITRATE+D  MAXIMUM) 315-6.25 MG-MCG TABS Take 1 tablet by mouth daily.   Cholecalciferol (VITAMIN D) 125 MCG (5000 UT) CAPS Take 1 capsule by mouth daily.   ELIQUIS 5 MG TABS tablet TAKE 1 TABLET BY MOUTH TWICE DAILY   famotidine (PEPCID) 20 MG tablet Take 1 tablet (20 mg total) by mouth daily.   FLUoxetine (PROZAC) 40 MG capsule Take 1 capsule (40 mg total) by mouth daily.   folic acid (FOLVITE) 1 MG tablet Take 1 mg by mouth daily.    hydroxychloroquine (PLAQUENIL) 200 MG tablet Take 200 mg by mouth 2 (two) times daily.   levothyroxine (SYNTHROID, LEVOTHROID) 88 MCG tablet Take 88 mcg by mouth daily before breakfast.   metFORMIN (GLUCOPHAGE-XR) 500 MG 24 hr tablet Take 500 mg by mouth daily.   methotrexate (RHEUMATREX) 2.5 MG tablet Take 15 mg by mouth once a week. Sunday   metoCLOPramide (REGLAN) 5 MG/5ML solution Take 5 mg by mouth 4 (four) times daily -  before meals and at bedtime.   metoprolol tartrate (LOPRESSOR) 25 MG tablet Take 25 mg by mouth 2 (two) times daily.   ondansetron (ZOFRAN-ODT) 4 MG disintegrating tablet Take 4 mg by mouth every 8 (eight) hours as needed.   pramipexole (MIRAPEX) 0.5 MG tablet Take 0.5 mg by mouth at bedtime.   pregabalin (LYRICA) 100 MG capsule Take 1 capsule (100 mg total) by mouth 3 (three) times daily.   promethazine (PHENERGAN) 12.5 MG tablet Take 12.5 mg by mouth.   QUEtiapine (SEROQUEL) 300 MG tablet Take 1 tablet (300 mg total) by mouth at bedtime. TAKE ONE TABLET BY MOUTH AT BEDTIME.   TRADJENTA 5 MG TABS tablet Take 5 mg by mouth daily.   zinc sulfate 220 (50 Zn) MG capsule Take 220 mg by mouth at bedtime.     Immunization History  Administered Date(s) Administered   Influenza, Seasonal, Injecte, Preservative Fre 02/04/2005, 02/23/2023   Influenza,inj,Quad PF,6+ Mos 04/14/2015, 03/23/2017, 02/07/2018, 02/01/2019, 02/07/2020, 02/08/2021, 03/22/2022   Influenza-Unspecified 02/02/2019   Moderna Covid-19 Fall Seasonal Vaccine 28yrs & older 03/22/2022    PFIZER(Purple Top)SARS-COV-2 Vaccination 03/18/2020, 04/08/2020, 06/03/2020   Pfizer Covid-19 Vaccine Bivalent Booster 102yrs & up 07/13/2021   Pneumococcal Conjugate-13 04/22/2014   Pneumococcal Polysaccharide-23 03/23/2017, 04/16/2019      Objective:     BP 110/60 (BP Location: Right Wrist, Cuff Size: Normal)   Pulse (!) 57   Temp (!) 97.1 F (36.2 C)   Ht 5\' 7"  (1.702 m)   Wt (!) 328 lb (148.8 kg) Comment: per patient.in a wheelchair today  LMP 04/20/2001   SpO2 95%   BMI 51.37 kg/m   SpO2: 95 % O2 Device: Nasal cannula O2 Flow Rate (L/min): 2 L/min O2 Type:  Pulse O2  GENERAL: Morbidly obese woman, presents in transport chair.  No respiratory distress.  Comfortable with nasal cannula O2 oxygen on.  No conversational dyspnea. HEAD: Normocephalic, atraumatic. EYES: Pupils equal, round, reactive to light.  No scleral icterus. MOUTH: Nose/mouth/throat not examined due to institutional masking requirements. NECK: Supple. No thyromegaly. Trachea midline. No JVD.  No adenopathy. PULMONARY: Good air entry bilaterally.  She has faint end expiratory wheezes throughout.   CARDIOVASCULAR: S1 and S2. Regular rate and rhythm.  Grade 1/6 to 2/6 systolic ejection murmur left sternal border. ABDOMEN: Obese, soft, nondistended. MUSCULOSKELETAL: RA changes both hands.  No clubbing or edema noted, no increased warmth in the lower extremities. Chronic stasis changes but no overt edema. NEUROLOGIC: No overt focal deficit, speech is fluent. SKIN: Intact,warm,dry.  Chronic stasis changes lower extremities.   PSYCH: Mood and behavior normal.  Assessment & Plan:     ICD-10-CM   1. Chronic asthmatic bronchitis (HCC)  J44.89     2. Pulmonary fibrosis, postinflammatory (HCC)  J84.10     3. Chronic respiratory failure with hypoxia (HCC)  J96.11     4. SOB (shortness of breath)  R06.02     5. Rheumatoid arthritis  M06.9     6. Morbid (severe) obesity with alveolar hypoventilation (HCC)  E66.2       Meds ordered this encounter  Medications   albuterol (VENTOLIN HFA) 108 (90 Base) MCG/ACT inhaler    Sig: Inhale 2 puffs into the lungs every 6 (six) hours as needed for wheezing or shortness of breath.    Dispense:  8 g    Refill:  2   Discussion:    Dyspnea related to Obesity Hypoventilation Syndrome and COPD with Asthmatic Bronchitis Chronic dyspnea primarily due to obesity hypoventilation syndrome and underlying COPD with asthmatic bronchitis. Recent pneumonia hospitalization may have exacerbated symptoms. Minimal wheezing noted. Oxygen levels are well-managed. Currently using Breztri but lacks a rescue inhaler. Discussed the importance of having a rescue inhaler for acute symptoms. Avoiding prednisone to prevent hyperglycemia and potential infection, especially given osteomyelitis. - Prescribe albuterol rescue inhaler (use up to four times a day as needed) - Continue Breztri - Avoid prednisone to prevent hyperglycemia and potential infection  Diastolic Dysfunction Suspected diastolic dysfunction contributing to dyspnea. Upcoming cardiology appointment arranged by primary care physician. Discussed the need for medication adjustment to manage diastolic dysfunction and fluid status. - Refer to cardiologist for medication adjustment to manage diastolic dysfunction and fluid status  Insomnia Difficulty sleeping since discontinuation of a sleeping pill. No snoring reported. Previous sleep study indicated the need for oxygen during sleep. Discussed evaluating current sleep hygiene and considering alternative sleep aids if necessary. - Evaluate current sleep hygiene and consider alternative sleep aids if necessary  Anticoagulation Management On Eliquis for prior clots and should continue to prevent recurrence. Medication confirmed to be prepackaged and taken regularly. Discussed the importance of continuing Eliquis due to high risk of clot recurrence. - Added Eliquis to the medication  list  General Health Maintenance Received flu shot during recent hospitalization. - Ensure flu shot is documented  Follow-up - Schedule follow-up visit in 4-6 weeks.      Gailen Shelter, MD Advanced Bronchoscopy PCCM [facility]    *This note was generated using voice recognition software/Dragon and/or AI transcription program.  Despite best efforts to proofread, errors can occur which can change the meaning. Any transcriptional errors that result from this process are unintentional and may not be fully corrected at  the time of dictation.

## 2023-03-21 NOTE — Patient Instructions (Addendum)
[facility] [address] [place] [address]  [phone]  VISIT SUMMARY:  [name] [name] visited today due to ongoing shortness of breath and dizziness, likely related to her obesity and underlying respiratory conditions. She recently had pneumonia and is currently on Eliquis. She also reports poor sleep quality and is using Breztri for her COPD but lacks a rescue inhaler. She received a flu shot during her recent hospital stay and needs supplies for her oxygen therapy.  YOUR PLAN:  -DYSPNEA RELATED TO OBESITY HYPOVENTILATION SYNDROME AND COPD WITH ASTHMATIC BRONCHITIS: Your chronic shortness of breath is primarily due to obesity hypoventilation syndrome and COPD with asthmatic bronchitis. Recent pneumonia may have worsened your symptoms. We will continue your Breztri medication and have prescribed an albuterol rescue inhaler to use up to four times a day as needed. We are avoiding prednisone to prevent high blood sugar and potential infections.  -DIASTOLIC DYSFUNCTION: Diastolic dysfunction, which affects how your heart fills with blood, may be contributing to your shortness of breath.  You have indicated that your primary care physician is coordinating with cardiology for a consultation to adjust your medications and manage your fluid status.  -INSOMNIA: You are having trouble sleeping since stopping one of your sleeping pills. We discussed evaluating your current sleep habits and considering alternative sleep aids if necessary.  -ANTICOAGULATION MANAGEMENT: You are on Eliquis to prevent blood clots due to your high risk. It is important to continue taking this medication regularly.  Your medication list updated to reflect this medication.  -GENERAL HEALTH MAINTENANCE: You received a flu shot during your recent hospital stay. We will ensure this is documented in your records.  INSTRUCTIONS:  Please schedule a follow-up visit in 4-6 weeks.

## 2023-03-22 ENCOUNTER — Ambulatory Visit: Payer: Medicaid Other | Admitting: Psychiatry

## 2023-03-26 MED ORDER — PRAMIPEXOLE 0.125 MG TABLET
ORAL_TABLET | Freq: Every evening | ORAL | 2 refills | 30 days
Start: 2023-03-26 — End: 2024-03-25

## 2023-03-26 MED ORDER — PRAMIPEXOLE 0.5 MG TABLET
ORAL_TABLET | Freq: Every evening | ORAL | 1 refills | 90 days
Start: 2023-03-26 — End: ?

## 2023-03-28 MED ORDER — METHOTREXATE SODIUM 2.5 MG TABLET
0 refills | 0.00 days
Start: 2023-03-28 — End: ?

## 2023-03-29 ENCOUNTER — Ambulatory Visit: Admit: 2023-03-29 | Discharge: 2023-03-30 | Payer: PRIVATE HEALTH INSURANCE

## 2023-03-29 ENCOUNTER — Ambulatory Visit
Admit: 2023-03-29 | Discharge: 2023-03-30 | Payer: PRIVATE HEALTH INSURANCE | Attending: Podiatrist | Primary: Podiatrist

## 2023-03-29 DIAGNOSIS — Z6841 Body Mass Index (BMI) 40.0 and over, adult: Principal | ICD-10-CM

## 2023-03-29 DIAGNOSIS — E66813 Class 3 severe obesity with body mass index (BMI) of 50.0 to 59.9 in adult, unspecified obesity type, unspecified whether serious comorbidity present (CMS-HCC): Principal | ICD-10-CM

## 2023-03-29 DIAGNOSIS — I480 Paroxysmal atrial fibrillation: Principal | ICD-10-CM

## 2023-03-29 DIAGNOSIS — I5030 Unspecified diastolic (congestive) heart failure: Principal | ICD-10-CM

## 2023-03-29 DIAGNOSIS — I5032 Chronic diastolic (congestive) heart failure: Principal | ICD-10-CM

## 2023-03-30 ENCOUNTER — Ambulatory Visit: Admit: 2023-03-30 | Discharge: 2023-04-01 | Payer: PRIVATE HEALTH INSURANCE

## 2023-03-31 MED ORDER — METHOTREXATE SODIUM 2.5 MG TABLET
0 refills | 0.00 days
Start: 2023-03-31 — End: ?

## 2023-04-06 DIAGNOSIS — M0579 Rheumatoid arthritis with rheumatoid factor of multiple sites without organ or systems involvement: Principal | ICD-10-CM

## 2023-04-06 MED ORDER — METHOTREXATE SODIUM 2.5 MG TABLET
ORAL_TABLET | ORAL | 1 refills | 84.00 days
Start: 2023-04-06 — End: ?

## 2023-04-26 ENCOUNTER — Ambulatory Visit
Admit: 2023-04-26 | Discharge: 2023-04-27 | Payer: PRIVATE HEALTH INSURANCE | Attending: "Endocrinology | Primary: "Endocrinology

## 2023-05-02 MED ORDER — PRAMIPEXOLE 0.5 MG TABLET
ORAL_TABLET | Freq: Every evening | ORAL | 1 refills | 90.00 days
Start: 2023-05-02 — End: ?

## 2023-05-02 MED ORDER — PRAMIPEXOLE 0.125 MG TABLET
ORAL_TABLET | Freq: Every evening | ORAL | 2 refills | 30.00 days
Start: 2023-05-02 — End: 2024-05-01

## 2023-05-05 ENCOUNTER — Ambulatory Visit: Payer: Medicaid Other | Admitting: Pulmonary Disease

## 2023-05-12 MED ORDER — LEVOTHYROXINE 88 MCG TABLET
ORAL_TABLET | Freq: Every day | ORAL | 1 refills | 90.00 days | Status: CP
Start: 2023-05-12 — End: 2024-05-11

## 2023-05-13 ENCOUNTER — Ambulatory Visit: Admit: 2023-05-13 | Discharge: 2023-05-13 | Payer: PRIVATE HEALTH INSURANCE

## 2023-05-13 DIAGNOSIS — I4891 Unspecified atrial fibrillation: Principal | ICD-10-CM

## 2023-05-13 DIAGNOSIS — N179 Acute kidney failure, unspecified: Principal | ICD-10-CM

## 2023-05-13 DIAGNOSIS — I5030 Unspecified diastolic (congestive) heart failure: Principal | ICD-10-CM

## 2023-05-13 MED ORDER — DAPAGLIFLOZIN PROPANEDIOL 10 MG TABLET
ORAL_TABLET | Freq: Every morning | ORAL | 0 refills | 90.00 days | Status: CN
Start: 2023-05-13 — End: 2023-08-11

## 2023-05-13 MED ORDER — EMPAGLIFLOZIN 10 MG TABLET
ORAL_TABLET | Freq: Every day | ORAL | 3 refills | 30.00 days | Status: CP
Start: 2023-05-13 — End: ?

## 2023-05-19 ENCOUNTER — Ambulatory Visit (INDEPENDENT_AMBULATORY_CARE_PROVIDER_SITE_OTHER): Payer: Medicaid Other | Admitting: Psychiatry

## 2023-05-19 ENCOUNTER — Encounter: Payer: Self-pay | Admitting: Psychiatry

## 2023-05-19 VITALS — BP 138/78 | HR 108 | Temp 98.3°F | Ht 67.0 in | Wt 329.8 lb

## 2023-05-19 DIAGNOSIS — F3341 Major depressive disorder, recurrent, in partial remission: Secondary | ICD-10-CM

## 2023-05-19 DIAGNOSIS — D509 Iron deficiency anemia, unspecified: Secondary | ICD-10-CM

## 2023-05-19 DIAGNOSIS — F4310 Post-traumatic stress disorder, unspecified: Secondary | ICD-10-CM | POA: Diagnosis not present

## 2023-05-19 DIAGNOSIS — G4700 Insomnia, unspecified: Secondary | ICD-10-CM

## 2023-05-19 MED ORDER — ARIPIPRAZOLE 2 MG PO TABS: 2.0000 mg | ORAL_TABLET | Freq: Every day | ORAL | 1 refills | Status: DC

## 2023-05-19 MED ORDER — TRAZODONE HCL 50 MG PO TABS: 25.0000 mg | ORAL_TABLET | Freq: Every evening | ORAL | 1 refills | Status: DC | PRN
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
## 2023-05-19 NOTE — Patient Instructions (Addendum)
Continue Fluoxetine 40 mg daily Decrease Abilify 2 mg at night  Continue Quetiapine 300 mg at night Start trazodone 25-50 mg at night as needed for insomnia  Next appointment: 3/27 at 4:30, Please contact your primary care provider for further evaluation and guidance regarding your anemia.

## 2023-05-20 MED ORDER — METOCLOPRAMIDE 5 MG/5 ML ORAL SOLUTION
3 refills | 0.00 days
Start: 2023-05-20 — End: ?

## 2023-05-23 ENCOUNTER — Ambulatory Visit (INDEPENDENT_AMBULATORY_CARE_PROVIDER_SITE_OTHER): Payer: Medicaid Other | Admitting: Pulmonary Disease

## 2023-05-23 ENCOUNTER — Encounter: Payer: Self-pay | Admitting: Pulmonary Disease

## 2023-05-23 VITALS — BP 120/78 | HR 82 | Temp 96.9°F | Ht 67.0 in | Wt 328.0 lb

## 2023-05-23 DIAGNOSIS — J8410 Pulmonary fibrosis, unspecified: Secondary | ICD-10-CM | POA: Diagnosis not present

## 2023-05-23 DIAGNOSIS — M069 Rheumatoid arthritis, unspecified: Secondary | ICD-10-CM

## 2023-05-23 DIAGNOSIS — J4489 Other specified chronic obstructive pulmonary disease: Secondary | ICD-10-CM | POA: Diagnosis not present

## 2023-05-23 DIAGNOSIS — J9611 Chronic respiratory failure with hypoxia: Secondary | ICD-10-CM | POA: Diagnosis not present

## 2023-05-23 DIAGNOSIS — E662 Morbid (severe) obesity with alveolar hypoventilation: Secondary | ICD-10-CM

## 2023-05-23 NOTE — Progress Notes (Signed)
Subjective:    Patient ID: [name] [name] [name], female    DOB: 10-10-1960, 63 y.o.   MRN: [id number]  Patient Care Team: [name] [name] Pick, NP as PCP - General  Chief Complaint  Patient presents with   Follow-up    DOE. No wheezing or cough.     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  Patient also has COPD with asthmatic bronchitis and extreme obesity with obesity hypoventilation. Patient was last seen on 21 March 2023.  She presents today for a scheduled visit.   HPI Discussed the use of AI scribe software for clinical note transcription with the patient, who gave verbal consent to proceed.  History of Present Illness   The patient, with obesity, obesity hypoventilation syndrome, chronic hypoxic respiratory failure, and asthmatic bronchitis, presents for follow-up.  She experiences frequent shortness of breath and uses Breztri twice daily, which alleviates her symptoms. She has completed a rehabilitation program and reports some weight loss, although she is not currently under the management of any specific healthcare provider for weight loss. Her primary care provider is [name] [name] [name]. She has a history of diabetes, and one of her doctors is considering medications that may assist with weight management, which she believes would help with her breathing issues.  Chronic hypoxic respiratory failure is managed with supplemental oxygen. She uses two liters of oxygen when resting and four liters when active. No leg swelling is noted.  Regarding her sleep, she denies any problems with snoring or quick breathing during sleep. She has undergone two sleep studies in the past, which indicated the need for oxygen supplementation during sleep  but no sleep apnea.      DATA 07/08/2019 echocardiogram: LVEF 60 to 65%, grade 1 DD. 01/23/2020 PFTs: FEV1 2.19 L or 78% predicted, FVC 2.59 L or 71% predicted, FEV1/FVC 84%, no bronchodilator response.  ERV 10% lung volumes otherwise normal diffusion capacity mildly to moderately impaired. 11/03/2021 echocardiogram: LVEF 55 to 60%, grade 1 DD, LA size mildly to moderately dilated. 03/17/2022 PFTs: FEV1 1.61 L or 58% predicted, FVC 2.05 L or 57% of predicted FEV1/FVC 79% there is response to bronchodilators with regards to airway resistance.  Lung volumes moderately reduced, moderate to severe diffusion defect 02/17/2023 echocardiogram [facility]): LVEF 65% grade 2 diastolic dysfunction (elevated filling pressure) moderately thickened aortic valve without aortic stenosis.  Right ventricle normal in size with normal systolic function.    Review of Systems A 10 point review of systems was performed and it is as noted above otherwise negative.   Patient Active Problem List   Diagnosis Date Noted   Acute gastroenteritis 03/03/2023   Essential hypertension 03/03/2023   Dyslipidemia 03/03/2023   Paroxysmal atrial fibrillation (HCC) 03/03/2023   AKI (acute kidney injury) (HCC) 03/02/2023   Diabetic infection of left foot (HCC) 02/02/2023   Community acquired pneumonia 02/02/2023   Acute respiratory failure with hypoxia (HCC) 02/01/2023   Multifocal pneumonia 02/01/2023   Acute on chronic diastolic CHF (congestive heart failure) (HCC) 02/01/2023   Acute UTI 01/31/2023   MRSA infection 01/13/2023   Gastroparesis 02/28/2022   Severe sepsis (HCC) 01/04/2022   Depression with anxiety    PAF (paroxysmal atrial fibrillation) (HCC)    Pulmonary fibrosis (HCC) 02/07/2020   Chronic respiratory failure with hypoxia (  HCC) 02/07/2020   Anemia 02/07/2020   Morbid obesity with BMI of 50.0-59.9, adult (HCC) 07/08/2019   Controlled type 2 diabetes mellitus without complication, without long-term current use of  insulin (HCC) 04/06/2019   CKD stage 3b, GFR 30-44 ml/min (HCC) 04/06/2019   HLD (hyperlipidemia) 04/06/2019   Abdominal pain 02/12/2019   Diabetic ulcer of left foot (HCC) 02/14/2018   Chronic pain syndrome 03/31/2016   Rheumatoid arthritis (HCC) 12/25/2015   Osteoarthritis, multiple sites 12/24/2015   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[employer]) 12/23/2015   Long term prescription opiate use 12/23/2015   Diabetic peripheral neuropathy (HCC) 12/23/2015   COPD (chronic obstructive pulmonary disease) (HCC) 01/03/2014   Bipolar disorder, unspecified (HCC) 01/03/2014   Borderline personality disorder (HCC) 01/06/2012   Anxiety and depression 01/06/2012   Nausea & vomiting 08/04/2011   Hypothyroidism 06/28/2010   Rheumatoid arthritis involving multiple sites with positive rheumatoid factor (HCC) 06/28/2010   Essential (primary) hypertension 01/27/2005   Major depressive disorder, recurrent episode, moderate (HCC) 06/03/2004    Social History   Tobacco Use   Smoking status: Former    Current packs/day: 0.00    Average packs/day: 2.0 packs/day for 27.0 years (54.0 ttl pk-yrs)    Types: Cigarettes    Start date: 07/29/1972    Quit date: 07/30/1999    Years since quitting: 23.8   Smokeless tobacco: Never   Tobacco comments:    quit in 2001 2-2.5 a day  Substance Use Topics   Alcohol use: No    Allergies  Allergen Reactions   Cefepime Anaphylaxis and Swelling    lips swell and the break out in blisters.  States lip swelling upon receiving it   Ceftriaxone Anaphylaxis    Angioedema while admitted at [facility] 01/12/2023 Tolerated amoxicillin    Cephalexin Hives   Codeine Palpitations, Nausea Only, Nausea And Vomiting, Rash and Shortness Of Breath    "makes heart fly, she gets flushed and passes out"   Doxycycline Rash   Propoxyphene Rash and Shortness Of Breath    Increase heart rate   Sulfa Antibiotics Palpitations, Nausea Only, Shortness Of Breath and Hives     "makes heart fly, she gets flushed and passes out"   Clindamycin Rash    Tongue swelling, oral sores, and Mouth rash   Daptomycin Other (See Comments)    Fever and pulmonary infiltrate   Lovenox [Enoxaparin Sodium] Hives   Hydrocodone Nausea And Vomiting    Hear racing & breaks out into a cold sweat.   Meropenem Rash    Erythematous, hot, pruritic rash over arms, chest, back, abdomen, and face occurred at the end of meropenem infusion on 02/22/18    Current Meds  Medication Sig   acetaminophen (TYLENOL) 325 MG tablet Take 2 tablets (650 mg total) by mouth every 6 (six) hours as needed for mild pain or moderate pain (or Fever >/= 101).   albuterol (VENTOLIN HFA) 108 (90 Base) MCG/ACT inhaler Inhale 2 puffs into the lungs every 6 (six) hours as needed for wheezing or shortness of breath.   amLODipine (NORVASC) 10 MG tablet Take 10 mg by mouth daily.   ARIPiprazole (ABILIFY) 2 MG tablet Take 1 tablet (2 mg total) by mouth at bedtime.   ascorbic acid (VITAMIN C) 500 MG tablet Take 1 tablet (500 mg total) by mouth daily.   atorvastatin (LIPITOR) 80 MG tablet Take 80 mg by mouth daily.   Budeson-Glycopyrrol-Formoterol (BREZTRI AEROSPHERE) 160-9-4.8 MCG/ACT AERO Inhale 2 puffs into the lungs in the  morning and at bedtime.   Calcium Citrate-Vitamin D3 (GNP CALCIUM CITRATE+D MAXIMUM) 315-6.25 MG-MCG TABS Take 1 tablet by mouth daily.   Cholecalciferol (VITAMIN D) 125 MCG (5000 UT) CAPS Take 1 capsule by mouth daily.   ELIQUIS 5 MG TABS tablet TAKE 1 TABLET BY MOUTH TWICE DAILY   famotidine (PEPCID) 20 MG tablet Take 1 tablet (20 mg total) by mouth daily.   folic acid (FOLVITE) 1 MG tablet Take 1 mg by mouth daily.    hydroxychloroquine (PLAQUENIL) 200 MG tablet Take 200 mg by mouth 2 (two) times daily.   levothyroxine (SYNTHROID, LEVOTHROID) 88 MCG tablet Take 88 mcg by mouth daily before breakfast.   methotrexate (RHEUMATREX) 2.5 MG tablet Take 15 mg by mouth once a week. Sunday   metoCLOPramide  (REGLAN) 5 MG/5ML solution Take 5 mg by mouth 4 (four) times daily -  before meals and at bedtime.   metoprolol tartrate (LOPRESSOR) 25 MG tablet Take 25 mg by mouth 2 (two) times daily.   ondansetron (ZOFRAN-ODT) 4 MG disintegrating tablet Take 4 mg by mouth every 8 (eight) hours as needed.   pramipexole (MIRAPEX) 0.5 MG tablet Take 0.5 mg by mouth at bedtime.   pregabalin (LYRICA) 100 MG capsule Take 1 capsule (100 mg total) by mouth 3 (three) times daily.   promethazine (PHENERGAN) 12.5 MG tablet Take 12.5 mg by mouth.   TRADJENTA 5 MG TABS tablet Take 5 mg by mouth daily.   traZODone (DESYREL) 50 MG tablet Take 0.5-1 tablets (25-50 mg total) by mouth at bedtime as needed for sleep.   zinc sulfate 220 (50 Zn) MG capsule Take 220 mg by mouth at bedtime.     Immunization History  Administered Date(s) Administered   Influenza, Seasonal, Injecte, Preservative Fre 02/04/2005, 02/23/2023   Influenza,inj,Quad PF,6+ Mos 04/14/2015, 03/23/2017, 02/07/2018, 02/01/2019, 02/07/2020, 02/08/2021, 03/22/2022   Influenza-Unspecified 02/02/2019   Moderna Covid-19 Fall Seasonal Vaccine 44yrs & older 03/22/2022   PFIZER(Purple Top)SARS-COV-2 Vaccination 03/18/2020, 04/08/2020, 06/03/2020   Pfizer Covid-19 Vaccine Bivalent Booster 27yrs & up 07/13/2021   Pneumococcal Conjugate-13 04/22/2014   Pneumococcal Polysaccharide-23 03/23/2017, 04/16/2019        Objective:     BP 120/78 (BP Location: Left Arm, Cuff Size: Large)   Pulse 82   Temp (!) 96.9 F (36.1 C)   Ht 5\' 7"  (1.702 m)   Wt (!) 328 lb (148.8 kg) Comment: per the patient. in a wheelchair today.  LMP 04/20/2001   SpO2 97%   BMI 51.37 kg/m   SpO2: 97 % O2 Device: None (Room air)  GENERAL: Morbidly obese woman, presents in transport chair.  No respiratory distress.  Comfortable with nasal cannula O2 oxygen on.  No conversational dyspnea. HEAD: Normocephalic, atraumatic. EYES: Pupils equal, round, reactive to light.  No scleral  icterus. MOUTH: Nose/mouth/throat not examined due to institutional masking requirements. NECK: Supple. No thyromegaly. Trachea midline. No JVD.  No adenopathy. PULMONARY: Good air entry bilaterally.  She has faint end expiratory wheezes throughout.   CARDIOVASCULAR: S1 and S2. Regular rate and rhythm.  Grade 1/6 to 2/6 systolic ejection murmur left sternal border. ABDOMEN: Obese, soft, nondistended. MUSCULOSKELETAL: RA changes both hands.  No clubbing or edema noted, no increased warmth in the lower extremities. Chronic stasis changes but no overt edema. NEUROLOGIC: No overt focal deficit, speech is fluent. SKIN: Intact,warm,dry.  Chronic stasis changes lower extremities.   PSYCH: Mood and behavior normal.   Assessment & Plan:     ICD-10-CM   1. Pulmonary  fibrosis, postinflammatory (HCC)  J84.10     2. Chronic respiratory failure with hypoxia (HCC)  J96.11     3. Chronic asthmatic bronchitis (HCC)  J44.89     4. Rheumatoid arthritis  M06.9     5. Morbid (severe) obesity with alveolar hypoventilation (HCC)  E66.2      Discussion:    Chronic Hypoxic Respiratory Failure Chronic hypoxic respiratory failure requiring supplemental oxygen. Uses 2 L/min of oxygen at rest and 4 L/min during activity. No peripheral edema; clear lung sounds on examination. - Continue oxygen therapy: 2 L/min at rest, 4 L/min during activity. - Follow up in three months to reassess oxygen needs and respiratory status.  Asthmatic Bronchitis Currently using Breztri twice daily with symptomatic improvement. Completed pulmonary rehabilitation. - Continue Breztri twice daily. - Monitor respiratory symptoms and adjust treatment as necessary.  Obesity Obesity contributing to respiratory issues. Reports some weight loss but not under specific weight management. Weight reduction could improve respiratory function. - Coordinate with primary care provider Sedalia Muta, NP for weight management strategies and  potential medications.  General Health Maintenance Two sleep studies indicated the need for oxygen during sleep. No issues with snoring or rapid breathing during sleep. - Ensure oxygen therapy adherence during sleep.  Follow-up - Follow up in three months.      Advised if symptoms do not improve or worsen, to please contact office for sooner follow up or seek emergency care.    I spent 35 minutes of dedicated to the care of this patient on the date of this encounter to include pre-visit review of records, face-to-face time with the patient discussing conditions above, post visit ordering of testing, clinical documentation with the electronic health record, making appropriate referrals as documented, and communicating necessary findings to members of the patients care team.     C. Danice Goltz, MD Advanced Bronchoscopy P[facility]M [facility]    *This note was generated using voice recognition software/Dragon and/or AI transcription program.  Despite best efforts to proofread, errors can occur which can change the meaning. Any transcriptional errors that result from this process are unintentional and may not be fully corrected at the time of dictation.

## 2023-05-23 NOTE — Patient Instructions (Signed)
VISIT SUMMARY:  You came in today for a follow-up visit to address your chronic hypoxic respiratory failure, asthmatic bronchitis, and obesity. We discussed your current treatments and any changes in your symptoms. You reported some weight loss and improvement in your breathing with the use of Breztri. We also reviewed your oxygen therapy needs and your sleep study results.  YOUR PLAN:  -CHRONIC HYPOXIC RESPIRATORY FAILURE: Chronic hypoxic respiratory failure means your body is not getting enough oxygen, which requires you to use supplemental oxygen. Continue using 2 liters per minute of oxygen at rest and 4 liters per minute during activity. We will reassess your oxygen needs and respiratory status in three months.  -ASTHMATIC BRONCHITIS: Asthmatic bronchitis is a condition where your airways are inflamed and cause breathing difficulties. Continue using Breztri twice daily as it has been helping with your symptoms. We will monitor your respiratory symptoms and adjust treatment if necessary.  -OBESITY: Obesity can contribute to respiratory issues and other health problems. You have reported some weight loss, which is good. Coordinate with your primary care provider, [name] Muta, NP, for weight management strategies and potential medications that could help weight loss and improve your respiratory function.  -GENERAL HEALTH MAINTENANCE: Your sleep studies have shown that you need oxygen during sleep. Make sure to adhere to your oxygen therapy during sleep to maintain your health.  INSTRUCTIONS:  Please follow up in three months to reassess your oxygen needs and respiratory status.

## 2023-05-26 ENCOUNTER — Telehealth: Payer: Self-pay

## 2023-05-26 ENCOUNTER — Other Ambulatory Visit: Payer: Self-pay | Admitting: Psychiatry

## 2023-05-26 MED ORDER — QUETIAPINE FUMARATE 300 MG PO TABS: 300.0000 mg | ORAL_TABLET | Freq: Every day | ORAL | 1 refills | Status: DC

## 2023-05-26 NOTE — Telephone Encounter (Signed)
 Ordered

## 2023-05-26 NOTE — Telephone Encounter (Signed)
 Pt.notified

## 2023-05-26 NOTE — Telephone Encounter (Signed)
 pt called states that she needs a refill on the seroquel . pt is out. pt last seen on 1-30 next appt 3-27

## 2023-06-03 ENCOUNTER — Encounter: Admit: 2023-06-03 | Discharge: 2023-06-04 | Payer: PRIVATE HEALTH INSURANCE

## 2023-06-03 DIAGNOSIS — I5030 Unspecified diastolic (congestive) heart failure: Principal | ICD-10-CM

## 2023-06-03 DIAGNOSIS — N1832 Stage 3b chronic kidney disease (CMS-HCC): Principal | ICD-10-CM

## 2023-06-03 DIAGNOSIS — E1169 Type 2 diabetes mellitus with other specified complication: Principal | ICD-10-CM

## 2023-06-06 ENCOUNTER — Telehealth: Payer: Self-pay

## 2023-06-06 DIAGNOSIS — Z794 Long term (current) use of insulin: Principal | ICD-10-CM

## 2023-06-06 DIAGNOSIS — E1165 Type 2 diabetes mellitus with hyperglycemia: Principal | ICD-10-CM

## 2023-06-06 DIAGNOSIS — W19XXXA Unspecified fall, initial encounter: Secondary | ICD-10-CM

## 2023-06-06 MED ORDER — TRADJENTA 5 MG TABLET
ORAL_TABLET | Freq: Every day | ORAL | 11 refills | 0.00 days
Start: 2023-06-06 — End: ?

## 2023-06-06 MED ORDER — ELIQUIS 5 MG TABLET
ORAL_TABLET | Freq: Two times a day (BID) | ORAL | 11 refills | 0.00 days
Start: 2023-06-06 — End: ?

## 2023-06-07 ENCOUNTER — Telehealth: Payer: Self-pay

## 2023-06-07 MED ORDER — ELIQUIS 5 MG TABLET
ORAL_TABLET | Freq: Two times a day (BID) | ORAL | 11 refills | 30.00 days | Status: CP
Start: 2023-06-07 — End: ?

## 2023-06-07 MED ORDER — TRADJENTA 5 MG TABLET
ORAL_TABLET | Freq: Every day | ORAL | 11 refills | 30.00 days | Status: CP
Start: 2023-06-07 — End: ?

## 2023-06-07 NOTE — Progress Notes (Signed)
Complex Care Management Note  Care Guide Note 06/07/2023 Name: [name] [name] MRN: [id number] DOB: [birth year]  [name] [name] is a 63 y.o. year old female who sees Noralyn Pick, NP for primary care. I reached out to [name] by phone today to offer complex care management services.  Ms. Umble was given information about Complex Care Management services today including:   The Complex Care Management services include support from the care team which includes your Nurse Care Manager, Clinical Social Worker, or Pharmacist.  The Complex Care Management team is here to help remove barriers to the health concerns and goals most important to you. Complex Care Management services are voluntary, and the patient may decline or stop services at any time by request to their care team member.   Complex Care Management Consent Status: Patient did not agree to participate in complex care management services at this time.  Follow up plan:  Patient declined services.   Encounter Outcome:  Patient Refused  Baruch Gouty [facility], [facility] Health Care Management Assistant Direct Dial: [phone]  Fax: [phone]

## 2023-06-09 MED ORDER — METOCLOPRAMIDE 5 MG/5 ML ORAL SOLUTION
3 refills | 0.00 days | Status: CP
Start: 2023-06-09 — End: ?

## 2023-06-11 ENCOUNTER — Other Ambulatory Visit: Payer: Self-pay | Admitting: Psychiatry

## 2023-06-13 MED ORDER — PRAMIPEXOLE 0.125 MG TABLET
ORAL_TABLET | Freq: Every evening | ORAL | 2 refills | 30.00 days
Start: 2023-06-13 — End: 2024-06-12

## 2023-06-20 ENCOUNTER — Ambulatory Visit: Admit: 2023-06-20 | Discharge: 2023-06-21 | Payer: PRIVATE HEALTH INSURANCE

## 2023-06-20 DIAGNOSIS — Z79899 Other long term (current) drug therapy: Principal | ICD-10-CM

## 2023-06-20 DIAGNOSIS — M0579 Rheumatoid arthritis with rheumatoid factor of multiple sites without organ or systems involvement: Principal | ICD-10-CM

## 2023-06-20 MED ORDER — METHOTREXATE SODIUM 2.5 MG TABLET
ORAL_TABLET | ORAL | 3 refills | 84.00 days | Status: CP
Start: 2023-06-20 — End: 2024-06-19

## 2023-06-24 ENCOUNTER — Encounter
Admit: 2023-06-24 | Discharge: 2023-06-25 | Payer: PRIVATE HEALTH INSURANCE | Attending: Student in an Organized Health Care Education/Training Program | Primary: Student in an Organized Health Care Education/Training Program

## 2023-06-24 DIAGNOSIS — E1169 Type 2 diabetes mellitus with other specified complication: Principal | ICD-10-CM

## 2023-06-24 DIAGNOSIS — N182 Chronic kidney disease, stage 2 (mild): Principal | ICD-10-CM

## 2023-06-24 DIAGNOSIS — R809 Proteinuria, unspecified: Principal | ICD-10-CM

## 2023-06-24 DIAGNOSIS — I5030 Unspecified diastolic (congestive) heart failure: Principal | ICD-10-CM

## 2023-06-30 ENCOUNTER — Ambulatory Visit
Admit: 2023-06-30 | Discharge: 2023-07-01 | Payer: PRIVATE HEALTH INSURANCE | Attending: Podiatrist | Primary: Podiatrist

## 2023-06-30 ENCOUNTER — Ambulatory Visit
Admit: 2023-06-30 | Discharge: 2023-07-04 | Disposition: A | Discharge status: Home / Self Care | Payer: PRIVATE HEALTH INSURANCE | Admitting: Internal Medicine

## 2023-06-30 ENCOUNTER — Inpatient Hospital Stay
Admit: 2023-06-30 | Discharge: 2023-07-04 | Disposition: A | Discharge status: Home / Self Care | Payer: PRIVATE HEALTH INSURANCE | Admitting: Internal Medicine

## 2023-06-30 DIAGNOSIS — L97422 Non-pressure chronic ulcer of left heel and midfoot with fat layer exposed: Principal | ICD-10-CM

## 2023-06-30 DIAGNOSIS — E11621 Type 2 diabetes mellitus with foot ulcer: Principal | ICD-10-CM

## 2023-06-30 DIAGNOSIS — E1142 Type 2 diabetes mellitus with diabetic polyneuropathy: Principal | ICD-10-CM

## 2023-06-30 DIAGNOSIS — L03116 Cellulitis of left lower limb: Principal | ICD-10-CM

## 2023-06-30 DIAGNOSIS — Z89422 Acquired absence of other left toe(s): Principal | ICD-10-CM

## 2023-07-04 MED ORDER — BLOOD GLUCOSE TEST STRIPS
ORAL_STRIP | 0 refills | 0.00 days | Status: CP
Start: 2023-07-04 — End: ?
  Filled 2023-07-04: qty 100, 25d supply, fill #0

## 2023-07-04 MED ORDER — LINEZOLID 600 MG TABLET
ORAL_TABLET | Freq: Two times a day (BID) | ORAL | 0 refills | 3 days | Status: CP
Start: 2023-07-04 — End: 2023-07-07
  Filled 2023-07-04: qty 5, 3d supply, fill #0

## 2023-07-04 MED ORDER — METRONIDAZOLE 500 MG TABLET
ORAL_TABLET | Freq: Three times a day (TID) | ORAL | 0 refills | 3.00 days | Status: CP
Start: 2023-07-04 — End: 2023-07-07
  Filled 2023-07-04: qty 7, 3d supply, fill #0

## 2023-07-04 MED ORDER — PEN NEEDLE, DIABETIC 32 GAUGE X 5/32" (4 MM)
0 refills | 0.00 days | Status: CP
Start: 2023-07-04 — End: ?
  Filled 2023-07-04: qty 100, 25d supply, fill #0

## 2023-07-04 MED ORDER — INSULIN LISPRO (U-100) 100 UNIT/ML SUBCUTANEOUS SOLUTION
Freq: Three times a day (TID) | SUBCUTANEOUS | 0 refills | 34.00 days | Status: CP
Start: 2023-07-04 — End: 2023-07-04
  Filled 2023-07-04: qty 15, 25d supply, fill #0

## 2023-07-04 MED ORDER — LEVOCETIRIZINE 5 MG TABLET
ORAL_TABLET | Freq: Every evening | ORAL | 0 refills | 5.00 days | Status: CP
Start: 2023-07-04 — End: 2023-07-09
  Filled 2023-07-04: qty 5, 5d supply, fill #0

## 2023-07-04 MED ORDER — INSULIN LISPRO (U-100) 100 UNIT/ML SUBCUTANEOUS PEN
Freq: Three times a day (TID) | SUBCUTANEOUS | 0 refills | 25.00 days | Status: CP
Start: 2023-07-04 — End: 2023-08-03

## 2023-07-04 MED ORDER — INSULIN GLARGINE (U-100) 100 UNIT/ML (3 ML) SUBCUTANEOUS PEN
Freq: Every evening | SUBCUTANEOUS | 0 refills | 30.00 days | Status: CP
Start: 2023-07-04 — End: 2023-08-03

## 2023-07-04 MED ORDER — PROCHLORPERAZINE MALEATE 5 MG TABLET
ORAL_TABLET | Freq: Four times a day (QID) | ORAL | 0 refills | 5.00 days | Status: CP | PRN
Start: 2023-07-04 — End: 2023-07-09
  Filled 2023-07-04: qty 20, 5d supply, fill #0

## 2023-07-04 MED ORDER — INSULIN GLARGINE (U-100) 100 UNIT/ML SUBCUTANEOUS SOLUTION
Freq: Every evening | SUBCUTANEOUS | 0 refills | 40.00 days | Status: CP
Start: 2023-07-04 — End: 2023-07-04

## 2023-07-04 MED ORDER — LANCETS
0 refills | 0.00 days | Status: CP
Start: 2023-07-04 — End: ?
  Filled 2023-07-04: qty 100, 25d supply, fill #0

## 2023-07-04 MED FILL — LANTUS SOLOSTAR U-100 INSULIN 100 UNIT/ML (3 ML) SUBCUTANEOUS PEN: SUBCUTANEOUS | 30 days supply | Qty: 15 | Fill #0

## 2023-07-05 MED ORDER — LEVOFLOXACIN 750 MG TABLET
ORAL_TABLET | Freq: Every day | ORAL | 0 refills | 2.00 days | Status: CP
Start: 2023-07-05 — End: 2023-07-04
  Filled 2023-07-04: qty 3, 3d supply, fill #0

## 2023-07-06 ENCOUNTER — Emergency Department
Admit: 2023-07-06 | Discharge: 2023-07-06 | Disposition: A | Discharge status: Home / Self Care | Payer: PRIVATE HEALTH INSURANCE | Attending: Emergency Medicine

## 2023-07-06 DIAGNOSIS — L089 Local infection of the skin and subcutaneous tissue, unspecified: Principal | ICD-10-CM

## 2023-07-06 DIAGNOSIS — S00521A Blister (nonthermal) of lip, initial encounter: Principal | ICD-10-CM

## 2023-07-06 MED ORDER — NYSTATIN 100,000 UNIT/GRAM TOPICAL CREAM
Freq: Two times a day (BID) | TOPICAL | 0 refills | 0.00 days | Status: CP
Start: 2023-07-06 — End: 2024-07-05

## 2023-07-06 MED ORDER — MUPIROCIN 2 % TOPICAL OINTMENT
Freq: Three times a day (TID) | TOPICAL | 0 refills | 7.00 days | Status: CP
Start: 2023-07-06 — End: 2023-07-13

## 2023-07-10 NOTE — Progress Notes (Unsigned)
 No show

## 2023-07-12 ENCOUNTER — Ambulatory Visit: Admit: 2023-07-12 | Discharge: 2023-07-13 | Payer: PRIVATE HEALTH INSURANCE | Attending: Family | Primary: Family

## 2023-07-12 DIAGNOSIS — G629 Polyneuropathy, unspecified: Principal | ICD-10-CM

## 2023-07-12 DIAGNOSIS — Z1231 Encounter for screening mammogram for malignant neoplasm of breast: Principal | ICD-10-CM

## 2023-07-12 DIAGNOSIS — G2581 Restless legs syndrome: Principal | ICD-10-CM

## 2023-07-12 MED ORDER — PREGABALIN 100 MG CAPSULE
ORAL_CAPSULE | Freq: Two times a day (BID) | ORAL | 3 refills | 90 days | Status: CP
Start: 2023-07-12 — End: 2024-07-06

## 2023-07-12 MED ORDER — PRAMIPEXOLE 1 MG TABLET
ORAL_TABLET | Freq: Every evening | ORAL | 1 refills | 90 days | Status: CP
Start: 2023-07-12 — End: 2024-07-11

## 2023-07-13 ENCOUNTER — Ambulatory Visit
Admit: 2023-07-13 | Discharge: 2023-07-14 | Payer: PRIVATE HEALTH INSURANCE | Attending: Dermatology | Primary: Dermatology

## 2023-07-13 DIAGNOSIS — K1230 Oral mucositis (ulcerative), unspecified: Principal | ICD-10-CM

## 2023-07-13 DIAGNOSIS — S00521A Blister (nonthermal) of lip, initial encounter: Principal | ICD-10-CM

## 2023-07-13 MED ORDER — CLOBETASOL 0.05 % TOPICAL OINTMENT
0 refills | 0 days | Status: CP
Start: 2023-07-13 — End: ?

## 2023-07-13 MED ORDER — TRIAMCINOLONE ACETONIDE 0.1 % TOPICAL OINTMENT
0 refills | 0 days | Status: CP
Start: 2023-07-13 — End: ?

## 2023-07-14 ENCOUNTER — Other Ambulatory Visit: Payer: Self-pay | Admitting: Psychiatry

## 2023-07-14 ENCOUNTER — Ambulatory Visit (INDEPENDENT_AMBULATORY_CARE_PROVIDER_SITE_OTHER): Payer: Self-pay | Admitting: Psychiatry

## 2023-07-14 DIAGNOSIS — Z91199 Patient's noncompliance with other medical treatment and regimen due to unspecified reason: Secondary | ICD-10-CM

## 2023-07-26 ENCOUNTER — Ambulatory Visit: Admit: 2023-07-26 | Discharge: 2023-07-27 | Attending: Foot & Ankle Surgery | Primary: Foot & Ankle Surgery

## 2023-07-26 DIAGNOSIS — E11621 Type 2 diabetes mellitus with foot ulcer: Principal | ICD-10-CM

## 2023-07-26 DIAGNOSIS — L97422 Non-pressure chronic ulcer of left heel and midfoot with fat layer exposed: Principal | ICD-10-CM

## 2023-08-01 ENCOUNTER — Other Ambulatory Visit: Payer: Self-pay | Admitting: Psychiatry

## 2023-08-01 DIAGNOSIS — E782 Mixed hyperlipidemia: Principal | ICD-10-CM

## 2023-08-01 MED ORDER — ARIPIPRAZOLE 2 MG PO TABS: 2.0000 mg | ORAL_TABLET | Freq: Every day | ORAL | 0 refills | Status: DC

## 2023-08-01 MED ORDER — TRAZODONE HCL 50 MG PO TABS: 25.0000 mg | ORAL_TABLET | Freq: Every evening | ORAL | 0 refills | Status: DC | PRN

## 2023-08-01 MED ORDER — FLUOXETINE HCL 40 MG PO CAPS: 40.0000 mg | ORAL_CAPSULE | Freq: Every day | ORAL | 0 refills | Status: DC

## 2023-08-01 MED ORDER — ATORVASTATIN 80 MG TABLET
ORAL_TABLET | 3 refills | 0.00 days | Status: CP
Start: 2023-08-01 — End: 2024-07-31

## 2023-08-01 MED ORDER — PRAMIPEXOLE 0.5 MG TABLET
ORAL_TABLET | Freq: Every evening | ORAL | 1 refills | 90.00 days
Start: 2023-08-01 — End: ?

## 2023-08-01 NOTE — Telephone Encounter (Signed)
 ordered

## 2023-08-01 NOTE — Telephone Encounter (Signed)
 pt left message that she needs refills on the abilify, fluoxetine, seroquel and the trazodone. pt was last seen on 1-30 next appt 5-6

## 2023-08-02 ENCOUNTER — Ambulatory Visit: Admit: 2023-08-02 | Discharge: 2023-08-03 | Payer: Medicaid (Managed Care)

## 2023-08-02 DIAGNOSIS — I5032 Chronic diastolic (congestive) heart failure: Principal | ICD-10-CM

## 2023-08-02 DIAGNOSIS — R739 Hyperglycemia, unspecified: Principal | ICD-10-CM

## 2023-08-02 DIAGNOSIS — I480 Paroxysmal atrial fibrillation: Principal | ICD-10-CM

## 2023-08-09 ENCOUNTER — Ambulatory Visit: Admit: 2023-08-09 | Payer: Medicaid (Managed Care) | Attending: Foot & Ankle Surgery | Primary: Foot & Ankle Surgery
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
## 2023-08-23 ENCOUNTER — Encounter: Payer: Self-pay | Admitting: Psychiatry

## 2023-08-23 ENCOUNTER — Other Ambulatory Visit: Payer: Self-pay

## 2023-08-23 ENCOUNTER — Other Ambulatory Visit: Payer: Self-pay | Admitting: Psychiatry

## 2023-08-23 ENCOUNTER — Ambulatory Visit (INDEPENDENT_AMBULATORY_CARE_PROVIDER_SITE_OTHER): Admitting: Psychiatry

## 2023-08-23 ENCOUNTER — Emergency Department
Admit: 2023-08-23 | Discharge: 2023-08-24 | Disposition: A | Discharge status: Home / Self Care | Payer: Medicaid (Managed Care) | Attending: Emergency Medicine

## 2023-08-23 ENCOUNTER — Ambulatory Visit
Admit: 2023-08-23 | Discharge: 2023-08-24 | Disposition: A | Discharge status: Home / Self Care | Payer: Medicaid (Managed Care) | Attending: Emergency Medicine | Primary: Podiatrist

## 2023-08-23 VITALS — BP 133/71 | HR 76 | Temp 97.9°F | Ht 67.0 in | Wt 346.8 lb

## 2023-08-23 DIAGNOSIS — L97511 Non-pressure chronic ulcer of other part of right foot limited to breakdown of skin: Principal | ICD-10-CM

## 2023-08-23 DIAGNOSIS — M869 Osteomyelitis, unspecified: Principal | ICD-10-CM

## 2023-08-23 DIAGNOSIS — L97502 Non-pressure chronic ulcer of other part of unspecified foot with fat layer exposed: Principal | ICD-10-CM

## 2023-08-23 DIAGNOSIS — L03119 Cellulitis of unspecified part of limb: Principal | ICD-10-CM

## 2023-08-23 DIAGNOSIS — E11621 Type 2 diabetes mellitus with foot ulcer: Principal | ICD-10-CM

## 2023-08-23 DIAGNOSIS — L03115 Cellulitis of right lower limb: Principal | ICD-10-CM

## 2023-08-23 DIAGNOSIS — L97522 Non-pressure chronic ulcer of other part of left foot with fat layer exposed: Principal | ICD-10-CM

## 2023-08-23 DIAGNOSIS — G4700 Insomnia, unspecified: Secondary | ICD-10-CM | POA: Diagnosis not present

## 2023-08-23 DIAGNOSIS — F4310 Post-traumatic stress disorder, unspecified: Secondary | ICD-10-CM

## 2023-08-23 DIAGNOSIS — F330 Major depressive disorder, recurrent, mild: Secondary | ICD-10-CM

## 2023-08-23 MED ORDER — ARIPIPRAZOLE 2 MG PO TABS: 2.0000 mg | ORAL_TABLET | Freq: Every day | ORAL | 2 refills | Status: DC

## 2023-08-23 MED ORDER — FLUOXETINE HCL 20 MG PO CAPS: 20.0000 mg | ORAL_CAPSULE | Freq: Every day | ORAL | 0 refills | Status: DC

## 2023-08-23 MED ORDER — VENLAFAXINE HCL ER 150 MG PO CP24: 150.0000 mg | ORAL_CAPSULE | Freq: Every day | ORAL | 1 refills | Status: DC

## 2023-08-23 MED ORDER — VENLAFAXINE HCL ER 37.5 MG PO CP24: ORAL_CAPSULE | ORAL | 0 refills | Status: DC

## 2023-08-23 MED ORDER — TRAZODONE HCL 100 MG PO TABS: 100.0000 mg | ORAL_TABLET | Freq: Every evening | ORAL | 1 refills | Status: DC | PRN

## 2023-08-23 MED ORDER — AMOXICILLIN 875 MG-POTASSIUM CLAVULANATE 125 MG TABLET
ORAL_TABLET | Freq: Two times a day (BID) | ORAL | 0 refills | 10.00000 days | Status: CP
Start: 2023-08-23 — End: 2023-09-02

## 2023-08-23 NOTE — Patient Instructions (Addendum)
 Decrease fluoxetine  20 mg daily for one week, then discontinue  Start venlafaxine 37.5 mg daily for one week, then 75 mg daily for one week, then 150 mg daily  Decrease Abilify  2 mg at night  Continue Quetiapine  300 mg at night Increase trazodone  100 mg at night as needed for insomnia  Next appointment: 6/19 at 8 AM

## 2023-08-24 DIAGNOSIS — E1165 Type 2 diabetes mellitus with hyperglycemia: Principal | ICD-10-CM

## 2023-08-24 DIAGNOSIS — Z794 Long term (current) use of insulin: Principal | ICD-10-CM

## 2023-08-24 MED ORDER — APIXABAN 5 MG TABLET
ORAL_TABLET | Freq: Two times a day (BID) | ORAL | 11 refills | 30.00000 days
Start: 2023-08-24 — End: 2024-08-22

## 2023-08-24 MED ORDER — TRADJENTA 5 MG TABLET
ORAL_TABLET | Freq: Every day | ORAL | 11 refills | 30.00000 days
Start: 2023-08-24 — End: 2024-08-22

## 2023-08-25 ENCOUNTER — Ambulatory Visit: Payer: Medicaid Other | Admitting: Pulmonary Disease

## 2023-08-25 ENCOUNTER — Encounter: Payer: Self-pay | Admitting: Pulmonary Disease

## 2023-08-25 VITALS — BP 130/90 | HR 66 | Temp 97.9°F | Ht 67.0 in | Wt 343.8 lb

## 2023-08-25 DIAGNOSIS — L03119 Cellulitis of unspecified part of limb: Secondary | ICD-10-CM | POA: Diagnosis not present

## 2023-08-25 DIAGNOSIS — Z87891 Personal history of nicotine dependence: Secondary | ICD-10-CM

## 2023-08-25 DIAGNOSIS — J9611 Chronic respiratory failure with hypoxia: Secondary | ICD-10-CM

## 2023-08-25 DIAGNOSIS — Z9981 Dependence on supplemental oxygen: Secondary | ICD-10-CM

## 2023-08-25 DIAGNOSIS — M069 Rheumatoid arthritis, unspecified: Secondary | ICD-10-CM

## 2023-08-25 DIAGNOSIS — Z86718 Personal history of other venous thrombosis and embolism: Secondary | ICD-10-CM

## 2023-08-25 DIAGNOSIS — Z6841 Body Mass Index (BMI) 40.0 and over, adult: Secondary | ICD-10-CM

## 2023-08-25 DIAGNOSIS — E662 Morbid (severe) obesity with alveolar hypoventilation: Secondary | ICD-10-CM

## 2023-08-25 DIAGNOSIS — Z7901 Long term (current) use of anticoagulants: Secondary | ICD-10-CM

## 2023-08-25 DIAGNOSIS — J8410 Pulmonary fibrosis, unspecified: Secondary | ICD-10-CM | POA: Diagnosis not present

## 2023-08-25 DIAGNOSIS — J4489 Other specified chronic obstructive pulmonary disease: Secondary | ICD-10-CM

## 2023-08-25 MED ORDER — APIXABAN 5 MG PO TABS: 5.0000 mg | ORAL_TABLET | Freq: Two times a day (BID) | ORAL | 11 refills | Status: DC

## 2023-08-25 NOTE — Progress Notes (Signed)
 Subjective:    Patient ID: [name] [name], female    DOB: June 08, 1960, 63 y.o.   MRN: [id number]  Patient Care Team: Laurence Pons, NP as PCP - General Marc Senior, MD as Consulting Physician (Pulmonary Disease)  Chief Complaint  Patient presents with   Follow-up    Shortness of breath on exertion. No cough or wheezing. Has been out of Eliquis  for over a week.     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  Patient also has COPD with asthmatic bronchitis and extreme obesity with obesity hypoventilation. Patient was last seen on 23 May 2023.  She presents today for a scheduled visit.   HPI Discussed the use of AI scribe software for clinical note transcription with the patient, who gave verbal consent to proceed.  History of Present Illness   [name] [name] "[name] [name]" is a 63 year old female with post-inflammatory pulmonary fibrosis and chronic respiratory failure who presents for follow-up.  Her breathing has improved with regular use of inhalers, specifically Breztri  and albuterol  as needed. No adjustments to her oxygen levels have been necessary, indicating stable management of her chronic respiratory failure with hypoxia.  She is very compliant with oxygen therapy.  She recently visited a podiatrist due to an open wound on her foot. Although the wound remains open, it is not infected. She was evaluated for cellulitis, which has since improved without the need for admission or x-rays. Her legs were marked to monitor for any progression, which has not occurred.  She has been out of her Eliquis  medication, which she uses for anticoagulation, but has not experienced any thromboembolic episodes since starting the  medication. She previously had issues with prescriptions being sent to the wrong pharmacy but has now switched all [facility] medications to [facility].  She discusses her obesity and the challenges it presents, particularly in relation to her gastroparesis, which prevents her from starting weight loss injections. Her gastroparesis is currently well-managed with medication.     DATA 07/08/2019 echocardiogram: LVEF 60 to 65%, grade 1 DD. 01/23/2020 PFTs: FEV1 2.19 L or 78% predicted, FVC 2.59 L or 71% predicted, FEV1/FVC 84%, no bronchodilator response.  ERV 10% lung volumes otherwise normal diffusion capacity mildly to moderately impaired. 11/03/2021 echocardiogram: LVEF 55 to 60%, grade 1 DD, LA size mildly to moderately dilated. 03/17/2022 PFTs: FEV1 1.61 L or 58% predicted, FVC 2.05 L or 57% of predicted FEV1/FVC 79% there is response to bronchodilators with regards to airway resistance.  Lung volumes moderately reduced, moderate to severe diffusion defect 02/02/2023 CT chest without contrast: Interstitial thickening and groundglass opacities in the upper lobes likely scarring/chronic lung disease.  Pendant and basilar atelectasis or scarring on the right. 02/17/2023 echocardiogram [facility]): LVEF 65% grade 2 diastolic dysfunction (elevated filling pressure) moderately thickened aortic valve without aortic stenosis.  Right ventricle normal in size with normal systolic function.   Review of Systems A 10 point review of systems was performed and it is as noted above otherwise negative.   Patient Active Problem List   Diagnosis Date Noted   Acute gastroenteritis 03/03/2023   Essential hypertension 03/03/2023   Dyslipidemia 03/03/2023   Paroxysmal atrial fibrillation (HCC) 03/03/2023   AKI (acute kidney injury) (HCC) 03/02/2023   Diabetic infection of left  foot (HCC) 02/02/2023   Community acquired pneumonia 02/02/2023   Acute respiratory failure with hypoxia (HCC) 02/01/2023   Multifocal pneumonia 02/01/2023    Acute on chronic diastolic CHF (congestive heart failure) (HCC) 02/01/2023   Acute UTI 01/31/2023   MRSA infection 01/13/2023   Gastroparesis 02/28/2022   Severe sepsis (HCC) 01/04/2022   Depression with anxiety    PAF (paroxysmal atrial fibrillation) (HCC)    Pulmonary fibrosis (HCC) 02/07/2020   Chronic respiratory failure with hypoxia (HCC) 02/07/2020   Anemia 02/07/2020   Morbid obesity with BMI of 50.0-59.9, adult (HCC) 07/08/2019   Controlled type 2 diabetes mellitus without complication, without long-term current use of insulin  (HCC) 04/06/2019   CKD stage 3b, GFR 30-44 ml/min (HCC) 04/06/2019   HLD (hyperlipidemia) 04/06/2019   Abdominal pain 02/12/2019   Diabetic ulcer of left foot (HCC) 02/14/2018   Chronic pain syndrome 03/31/2016   Rheumatoid arthritis (HCC) 12/25/2015   Osteoarthritis, multiple sites 12/24/2015   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[employer]) 12/23/2015   Long term prescription opiate use 12/23/2015   Diabetic peripheral neuropathy (HCC) 12/23/2015   COPD (chronic obstructive pulmonary disease) (HCC) 01/03/2014   Bipolar disorder, unspecified (HCC) 01/03/2014   Borderline personality disorder (HCC) 01/06/2012   Anxiety and depression 01/06/2012   Nausea & vomiting 08/04/2011   Hypothyroidism 06/28/2010   Rheumatoid arthritis involving multiple sites with positive rheumatoid factor (HCC) 06/28/2010   Essential (primary) hypertension 01/27/2005   Major depressive disorder, recurrent episode, moderate (HCC) 06/03/2004    Social History   Tobacco Use   Smoking status: Former    Current packs/day: 0.00    Average packs/day: 2.0 packs/day for 27.0 years (54.0 ttl pk-yrs)    Types: Cigarettes    Start date: 07/29/1972    Quit date: 07/30/1999    Years since quitting: 24.1   Smokeless tobacco: Never   Tobacco comments:    quit in 2001 2-2.5 a day  Substance Use Topics   Alcohol use: No    Allergies  Allergen Reactions    Cefepime  Anaphylaxis and Swelling    lips swell and the break out in blisters.  States lip swelling upon receiving it   Ceftriaxone  Anaphylaxis    Angioedema while admitted at [facility] 01/12/2023 Tolerated amoxicillin     Cephalexin  Hives   Codeine  Palpitations, Nausea Only, Nausea And Vomiting, Rash and Shortness Of Breath    "makes heart fly, she gets flushed and passes out"   Doxycycline Rash   Propoxyphene Rash and Shortness Of Breath    Increase heart rate   Sulfa Antibiotics Palpitations, Nausea Only, Shortness Of Breath and Hives    "makes heart fly, she gets flushed and passes out"   Clindamycin  Rash    Tongue swelling, oral sores, and Mouth rash   Daptomycin  Other (See Comments)    Fever and pulmonary infiltrate   Lovenox  [Enoxaparin  Sodium] Hives   Hydrocodone Nausea And Vomiting    Hear racing & breaks out into a cold sweat.   Meropenem Rash    Erythematous, hot, pruritic rash over arms, chest, back, abdomen, and face occurred at the end of meropenem infusion on 02/22/18    Current Meds  Medication Sig   acetaminophen  (TYLENOL ) 325 MG tablet Take 2 tablets (650 mg total) by mouth every 6 (six) hours as needed for mild pain or moderate pain (or Fever >/= 101).   albuterol  (VENTOLIN  HFA) 108 (90 Base) MCG/ACT inhaler Inhale 2 puffs into the lungs every 6 (six) hours as needed for  wheezing or shortness of breath.   amLODipine  (NORVASC ) 10 MG tablet Take 10 mg by mouth daily.   amoxicillin -clavulanate (AUGMENTIN ) 875-125 MG tablet Take 1 tablet by mouth 2 (two) times daily.   ARIPiprazole  (ABILIFY ) 2 MG tablet Take 1 tablet (2 mg total) by mouth at bedtime.   ascorbic acid  (VITAMIN C ) 500 MG tablet Take 1 tablet (500 mg total) by mouth daily.   atorvastatin  (LIPITOR ) 80 MG tablet Take 80 mg by mouth daily.   Budeson-Glycopyrrol-Formoterol  (BREZTRI  AEROSPHERE) 160-9-4.8 MCG/ACT AERO Inhale 2 puffs into the lungs in the morning and at bedtime.   Calcium  Citrate-Vitamin D3 (GNP  CALCIUM  CITRATE+D MAXIMUM) 315-6.25 MG-MCG TABS Take 1 tablet by mouth daily.   Cholecalciferol  (VITAMIN D ) 125 MCG (5000 UT) CAPS Take 1 capsule by mouth daily.   famotidine  (PEPCID ) 20 MG tablet Take 1 tablet (20 mg total) by mouth daily.   folic acid  (FOLVITE ) 1 MG tablet Take 1 mg by mouth daily.    hydroxychloroquine  (PLAQUENIL ) 200 MG tablet Take 200 mg by mouth 2 (two) times daily.   JARDIANCE 10 MG TABS tablet Take 10 mg by mouth daily.   levothyroxine  (SYNTHROID , LEVOTHROID) 88 MCG tablet Take 88 mcg by mouth daily before breakfast.   metFORMIN  (GLUCOPHAGE -XR) 500 MG 24 hr tablet Take 500 mg by mouth daily.   methotrexate  (RHEUMATREX) 2.5 MG tablet Take 15 mg by mouth once a week. Sunday   metoCLOPramide  (REGLAN ) 5 MG/5ML solution Take 5 mg by mouth 4 (four) times daily -  before meals and at bedtime.   metoprolol  tartrate (LOPRESSOR ) 25 MG tablet Take 25 mg by mouth 2 (two) times daily.   ondansetron  (ZOFRAN -ODT) 4 MG disintegrating tablet Take 4 mg by mouth every 8 (eight) hours as needed.   pramipexole  (MIRAPEX ) 0.5 MG tablet Take 0.5 mg by mouth at bedtime.   pregabalin  (LYRICA ) 100 MG capsule Take 1 capsule (100 mg total) by mouth 3 (three) times daily.   promethazine  (PHENERGAN ) 12.5 MG tablet Take 12.5 mg by mouth.   QUEtiapine  (SEROQUEL ) 300 MG tablet Take 1 tablet (300 mg total) by mouth at bedtime.   TRADJENTA  5 MG TABS tablet Take 5 mg by mouth daily.   traZODone  (DESYREL ) 100 MG tablet Take 1 tablet (100 mg total) by mouth at bedtime as needed for sleep.   venlafaxine  XR (EFFEXOR  XR) 37.5 MG 24 hr capsule Take 1 capsule (37.5 mg total) by mouth daily with breakfast for 7 days, THEN 2 capsules (75 mg total) daily with breakfast for 7 days.   [START ON 09/05/2023] venlafaxine  XR (EFFEXOR -XR) 150 MG 24 hr capsule Take 1 capsule (150 mg total) by mouth daily with breakfast. Please start after completing 75 mg daily for one week   zinc  sulfate 220 (50 Zn) MG capsule Take 220 mg by  mouth at bedtime.     Immunization History  Administered Date(s) Administered   Influenza, Seasonal, Injecte, Preservative Fre 02/04/2005, 02/23/2023   Influenza,inj,Quad PF,6+ Mos 04/14/2015, 03/23/2017, 02/07/2018, 02/01/2019, 02/07/2020, 02/08/2021, 03/22/2022   Influenza-Unspecified 02/02/2019   Moderna Covid-19 Fall Seasonal Vaccine 51yrs & older 03/22/2022   PFIZER(Purple Top)SARS-COV-2 Vaccination 03/18/2020, 04/08/2020, 06/03/2020   Pfizer Covid-19 Vaccine Bivalent Booster 6yrs & up 07/13/2021   Pneumococcal Conjugate-13 04/22/2014   Pneumococcal Polysaccharide-23 03/23/2017, 04/16/2019        Objective:     BP (!) 130/90 (BP Location: Left Arm, Patient Position: Sitting, Cuff Size: Large)   Pulse 66   Temp 97.9 F (36.6 C) (Temporal)  Ht 5\' 7"  (1.702 m)   Wt (!) 343 lb 12.8 oz (155.9 kg)   LMP 04/20/2001   SpO2 97% Comment: 2L pulse oxygen  BMI 53.85 kg/m   SpO2: 97 % (2L pulse oxygen) O2 Device: Nasal cannula O2 Flow Rate (L/min): 2 L/min O2 Type: Pulse O2  GENERAL: Morbidly obese woman, presents in transport chair.  No respiratory distress.  Comfortable with nasal cannula O2 oxygen on.  No conversational dyspnea. HEAD: Normocephalic, atraumatic. EYES: Pupils equal, round, reactive to light.  No scleral icterus. MOUTH: Nose/mouth/throat not examined due to institutional masking requirements. NECK: Supple. No thyromegaly. Trachea midline. No JVD.  No adenopathy. PULMONARY: Good air entry bilaterally.  She has faint end expiratory wheezes throughout.   CARDIOVASCULAR: S1 and S2. Regular rate and rhythm.  Grade 1/6 to 2/6 systolic ejection murmur left sternal border. ABDOMEN: Obese, soft, nondistended. MUSCULOSKELETAL: RA changes both hands.  No clubbing or edema noted, no increased warmth in the lower extremities. Chronic stasis changes but no overt edema. NEUROLOGIC: No overt focal deficit, speech is fluent. SKIN: Intact,warm,dry.  Chronic stasis changes lower  extremities.  Acute cellulitis on the left lower extremity, the redness appears to be regressing (marked at the ED).  Foot in protective boot. PSYCH: Mood and behavior normal.  Patient cannot ambulate today due to issues with ongoing cellulitis and infection on her left lower extremity.      Assessment & Plan:     ICD-10-CM   1. Pulmonary fibrosis, postinflammatory (HCC)  J84.10     2. Chronic respiratory failure with hypoxia (HCC)  J96.11     3. Chronic asthmatic bronchitis (HCC)  J44.89     4. Current long-term use of anticoagulant medication with history of deep venous thrombosis (DVT)  Z86.718    Z79.01     5. Rheumatoid arthritis  M06.9     6. Morbid (severe) obesity with alveolar hypoventilation (HCC)  E66.2      Meds ordered this encounter  Medications   apixaban  (ELIQUIS ) 5 MG TABS tablet    Sig: Take 1 tablet (5 mg total) by mouth 2 (two) times daily.    Dispense:  60 tablet    Refill:  11   Discussion:    Chronic respiratory failure with hypoxia Chronic respiratory failure with hypoxia is well-managed with current oxygen therapy and inhalers. - Continue current oxygen therapy regimen. - Continue Breztri  and as-needed albuterol .  Asthmatic bronchitis Asthmatic bronchitis is well-controlled with Breztri  and albuterol . - Continue Breztri  and as-needed albuterol .  Post-inflammatory pulmonary fibrosis Post-inflammatory pulmonary fibrosis is well-managed with no acute issues.  Obesity with obesity hypoventilation syndrome Obesity with obesity hypoventilation syndrome is being managed. Weight loss injections were discussed but not recommended due to gastroparesis. Gastroparesis is under control with medication.  Gastroparesis Gastroparesis is under control with current medication regimen. Weight loss injections not recommended due to potential exacerbation of gastroparesis. - Continue current gastroparesis medication.   Chronic infection of left foot and current  cellulitis Patient cannot perform ambulatory oximetry today.  Will perform on follow-up visit if this has improved.  We will see the patient in follow-up in 4 months time call sooner should any new problems arise.     Advised if symptoms do not improve or worsen, to please contact office for sooner follow up or seek emergency care.    I spent 42 minutes of dedicated to the care of this patient on the date of this encounter to include pre-visit review of records, face-to-face  time with the patient discussing conditions above, post visit ordering of testing, clinical documentation with the electronic health record, making appropriate referrals as documented, and communicating necessary findings to members of the patients care team.    C. Chloe Counter, MD [facility]    *This note was generated using voice recognition software/Dragon and/or AI transcription program.  Despite best efforts to proofread, errors can occur which can change the meaning. Any transcriptional errors that result from this process are unintentional and may not be fully corrected at the time of dictation.

## 2023-08-25 NOTE — Patient Instructions (Addendum)
 VISIT SUMMARY:  [name] [name], a 63 year old female with chronic respiratory issues and other health concerns, came in for a follow-up appointment. Her breathing has improved with regular use of inhalers, and her oxygen levels remain stable. She recently saw a podiatrist for an open wound on her foot, which is not infected and is being monitored. She has been out of her Eliquis  medication but has not experienced any issues. She also discussed her obesity and the challenges it presents, particularly in relation to her gastroparesis, which is currently well-managed with medication.  YOUR PLAN:  -CHRONIC RESPIRATORY FAILURE WITH HYPOXIA: Chronic respiratory failure with hypoxia means your lungs are not getting enough oxygen into your blood. Your condition is well-managed with your current oxygen therapy and inhalers. Please continue your current oxygen therapy regimen and use Breztri  and albuterol  as needed.  -ASTHMATIC BRONCHITIS: Asthmatic bronchitis is a condition where the airways in your lungs are inflamed and produce extra mucus, making it hard to breathe. Your condition is well-controlled with Breztri  and albuterol . Please continue using these inhalers as needed.  -POST-INFLAMMATORY PULMONARY FIBROSIS: Post-inflammatory pulmonary fibrosis is a condition where lung tissue becomes scarred after inflammation. Your condition is well-managed with no acute issues at this time.  -OBESITY WITH OBESITY HYPOVENTILATION SYNDROME: Obesity hypoventilation syndrome is a breathing disorder seen in some people who are obese, where poor breathing leads to low oxygen and high carbon dioxide levels in the blood. Weight loss injections were discussed but are not recommended due to your gastroparesis. Your gastroparesis is under control with medication.  -GASTROPARESIS: Gastroparesis is a condition that affects the stomach muscles and prevents proper stomach emptying. Your condition is under control with your current  medication regimen. Weight loss injections are not recommended as they may worsen your gastroparesis. Please continue your current medication.  INSTRUCTIONS:  Please ensure you [facility] to avoid running out. Continue monitoring the wound on your foot for any signs of infection or progression. Follow up with your podiatrist as needed.  Will see you in follow-up in 4 months time call sooner should any new problems arise.

## 2023-08-26 MED ORDER — TRADJENTA 5 MG TABLET
ORAL_TABLET | Freq: Every day | ORAL | 11 refills | 30.00000 days | Status: CP
Start: 2023-08-26 — End: 2024-08-24

## 2023-08-26 MED ORDER — APIXABAN 5 MG TABLET
ORAL_TABLET | Freq: Two times a day (BID) | ORAL | 11 refills | 30.00000 days | Status: CP
Start: 2023-08-26 — End: 2024-08-24

## 2023-09-02 ENCOUNTER — Ambulatory Visit
Admit: 2023-09-02 | Discharge: 2023-09-02 | Payer: Medicaid (Managed Care) | Attending: Student in an Organized Health Care Education/Training Program | Primary: Student in an Organized Health Care Education/Training Program

## 2023-09-02 ENCOUNTER — Inpatient Hospital Stay: Admit: 2023-09-02 | Discharge: 2023-09-02 | Payer: Medicaid (Managed Care)

## 2023-09-02 DIAGNOSIS — E039 Hypothyroidism, unspecified: Principal | ICD-10-CM

## 2023-09-02 DIAGNOSIS — E139 Other specified diabetes mellitus without complications: Principal | ICD-10-CM

## 2023-09-02 DIAGNOSIS — M818 Other osteoporosis without current pathological fracture: Principal | ICD-10-CM

## 2023-09-06 ENCOUNTER — Ambulatory Visit: Admit: 2023-09-06 | Discharge: 2023-09-07 | Payer: Medicaid (Managed Care) | Attending: Podiatrist | Primary: Podiatrist

## 2023-09-06 DIAGNOSIS — M2041 Other hammer toe(s) (acquired), right foot: Principal | ICD-10-CM

## 2023-09-06 DIAGNOSIS — M2042 Other hammer toe(s) (acquired), left foot: Principal | ICD-10-CM

## 2023-09-06 DIAGNOSIS — R600 Localized edema: Principal | ICD-10-CM

## 2023-09-06 DIAGNOSIS — E1142 Type 2 diabetes mellitus with diabetic polyneuropathy: Principal | ICD-10-CM

## 2023-09-06 DIAGNOSIS — Z89422 Acquired absence of other left toe(s): Principal | ICD-10-CM

## 2023-09-06 DIAGNOSIS — L84 Corns and callosities: Principal | ICD-10-CM

## 2023-09-13 ENCOUNTER — Ambulatory Visit: Admit: 2023-09-13 | Payer: Medicaid (Managed Care) | Attending: Podiatrist | Primary: Podiatrist

## 2023-09-15 ENCOUNTER — Other Ambulatory Visit: Payer: Self-pay | Admitting: Psychiatry

## 2023-09-19 ENCOUNTER — Inpatient Hospital Stay: Admit: 2023-09-19 | Discharge: 2023-09-20 | Payer: Medicaid (Managed Care)

## 2023-09-19 ENCOUNTER — Ambulatory Visit: Admit: 2023-09-19 | Discharge: 2023-09-20 | Payer: Medicaid (Managed Care)

## 2023-09-19 DIAGNOSIS — M0579 Rheumatoid arthritis with rheumatoid factor of multiple sites without organ or systems involvement: Principal | ICD-10-CM

## 2023-09-20 MED ORDER — AMLODIPINE 10 MG TABLET
ORAL_TABLET | Freq: Every day | ORAL | 0 refills | 30.00000 days | Status: CP
Start: 2023-09-20 — End: ?

## 2023-09-21 ENCOUNTER — Other Ambulatory Visit: Payer: Self-pay | Admitting: Psychiatry

## 2023-09-26 ENCOUNTER — Telehealth: Payer: Self-pay

## 2023-09-26 NOTE — Telephone Encounter (Signed)
 I spoke with the patient. She said [employer] is telling her she needs to have a breathing test so she can re-qualify for her oxygen. I told her I would have out [facility] reach out to [employer] and see exactly what they need and we will let her know.  Almira Armour, please see if [employer] is wanting her to have a breathing test or if they just need an updated office note to re-qualify her. Thank you!

## 2023-09-27 NOTE — Telephone Encounter (Signed)
 I have sent urgent message to [employer] asking them what is needed

## 2023-09-28 NOTE — Telephone Encounter (Signed)
 I have received a message from Dolanda with [employer] [facility], Dolanda   From the notes it looks like it's to requalify I will reach out to the team and have them follow up with her

## 2023-09-29 DIAGNOSIS — M0579 Rheumatoid arthritis with rheumatoid factor of multiple sites without organ or systems involvement: Principal | ICD-10-CM

## 2023-09-29 DIAGNOSIS — U099 Post-COVID syndrome: Principal | ICD-10-CM
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
## 2023-10-06 ENCOUNTER — Ambulatory Visit (INDEPENDENT_AMBULATORY_CARE_PROVIDER_SITE_OTHER): Admitting: Psychiatry

## 2023-10-06 ENCOUNTER — Encounter: Payer: Self-pay | Admitting: Psychiatry

## 2023-10-06 VITALS — BP 124/88 | HR 83 | Temp 98.4°F | Ht 67.0 in | Wt 348.2 lb

## 2023-10-06 DIAGNOSIS — F330 Major depressive disorder, recurrent, mild: Secondary | ICD-10-CM

## 2023-10-06 DIAGNOSIS — F4310 Post-traumatic stress disorder, unspecified: Secondary | ICD-10-CM

## 2023-10-06 DIAGNOSIS — G4700 Insomnia, unspecified: Secondary | ICD-10-CM

## 2023-10-06 DIAGNOSIS — G2581 Restless legs syndrome: Secondary | ICD-10-CM

## 2023-10-06 MED ORDER — TRAZODONE HCL 100 MG PO TABS: 100.0000 mg | ORAL_TABLET | Freq: Every evening | ORAL | 0 refills | Status: DC | PRN

## 2023-10-06 NOTE — Patient Instructions (Signed)
 Decrease fluoxetine  20 mg daily for one week, then discontinue  Start venlafaxine  37.5 mg daily for one week, then 75 mg daily for one week, then 150 mg daily  Continue Abilify  2 mg at night  Continue Quetiapine  300 mg at night Increase trazodone  100 mg at night as needed for insomnia  Obtain lab (ferritin) at [facility]  Next appointment: 7/28 at 10:30

## 2023-10-07 ENCOUNTER — Ambulatory Visit
Admit: 2023-10-07 | Discharge: 2023-10-08 | Payer: Medicaid (Managed Care) | Attending: Rehabilitative and Restorative Service Providers" | Primary: Rehabilitative and Restorative Service Providers"

## 2023-10-12 ENCOUNTER — Inpatient Hospital Stay: Admit: 2023-10-12 | Discharge: 2023-10-12 | Payer: Medicaid (Managed Care)

## 2023-10-16 MED ORDER — AMLODIPINE 10 MG TABLET
ORAL_TABLET | Freq: Every day | ORAL | 1 refills | 0.00000 days
Start: 2023-10-16 — End: ?

## 2023-10-17 MED ORDER — AMLODIPINE 10 MG TABLET
ORAL_TABLET | Freq: Every day | ORAL | 1 refills | 90.00000 days
Start: 2023-10-17 — End: ?

## 2023-10-19 ENCOUNTER — Ambulatory Visit: Admit: 2023-10-19 | Discharge: 2023-10-20 | Payer: Medicaid (Managed Care)

## 2023-10-19 DIAGNOSIS — Z79899 Other long term (current) drug therapy: Principal | ICD-10-CM

## 2023-10-19 DIAGNOSIS — M0579 Rheumatoid arthritis with rheumatoid factor of multiple sites without organ or systems involvement: Principal | ICD-10-CM

## 2023-10-27 ENCOUNTER — Ambulatory Visit: Payer: Self-pay

## 2023-10-27 ENCOUNTER — Emergency Department
Admit: 2023-10-27 | Discharge: 2023-10-28 | Disposition: A | Discharge status: Home / Self Care | Payer: Medicaid (Managed Care)

## 2023-10-27 NOTE — Telephone Encounter (Signed)
 FYI Only or Action Required?: FYI only for provider.  Patient is followed in Pulmonology for COPD, last seen on 08/25/2023 by Tamea Dedra CROME, MD.  Called Nurse Triage reporting No chief complaint on file..  Symptoms began several months ago.  Interventions attempted: Nothing.  Symptoms are: gradually worsening.  Triage Disposition: See Physician Within 24 Hours  Patient/caregiver understands and will follow disposition?: Yes   Copied from CRM [id number]. Topic: Clinical - Red Word Triage >> Oct 27, 2023  9:19 AM Joesph PARAS wrote: Red Word that prompted transfer to Nurse Triage: States hiatal hernia in belly and unsure of what doctor to call - states if affecting breathing as it has gotten remarkably worse the last few weeks. States hernia is hurting.  Reason for Disposition  Can't reduce the hernia  (Note: NO pain, tenderness of hernia, or vomiting)  Answer Assessment - Initial Assessment Questions 1. ONSET:  When did this first appear?     Chronic  2. APPEARANCE: What does it look like?     Flesh Colored, Firm  3. SIZE: How big is it? (e.g., inches, cm; or compare to coins, fruit)     Noticeably Large  4. LOCATION: Where exactly is the hernia located?     Upper Abdominal Area, Right Side  5. PATTERN: Does the swelling come and go, or has it been constant since it started?     Constant  6. PAIN: Is there any pain? If Yes, ask: How bad is it?  (Scale 0-10; or none, mild, moderate, severe)     Unsure  7. DIAGNOSIS: Have you been seen by a doctor (or NP/PA) for this? Did the doctor diagnose you as having a hernia?     Previously  8. OTHER SYMPTOMS: Do you have any other symptoms? (e.g., fever, abdomen pain, vomiting)     Abdominal Pain  9. PREGNANCY: Is there any chance you are pregnant? When was your last menstrual period?     No and No  Protocols used: Hernia-A-AH

## 2023-10-28 MED ORDER — NYSTATIN 100,000 UNIT/GRAM TOPICAL CREAM
Freq: Two times a day (BID) | TOPICAL | 0 refills | 14.00000 days | Status: CP
Start: 2023-10-28 — End: 2023-11-11

## 2023-10-29 DIAGNOSIS — B952 Enterococcus as the cause of diseases classified elsewhere: Principal | ICD-10-CM

## 2023-10-29 DIAGNOSIS — N390 Urinary tract infection, site not specified: Principal | ICD-10-CM

## 2023-10-29 MED ORDER — LINEZOLID 600 MG TABLET
ORAL_TABLET | Freq: Two times a day (BID) | ORAL | 0 refills | 10.00000 days | Status: CP
Start: 2023-10-29 — End: 2023-11-08

## 2023-10-30 ENCOUNTER — Emergency Department
Admit: 2023-10-30 | Discharge: 2023-10-30 | Disposition: A | Discharge status: Home / Self Care | Payer: Medicaid (Managed Care) | Attending: Emergency Medicine

## 2023-11-02 ENCOUNTER — Emergency Department
Admit: 2023-11-02 | Discharge: 2023-11-02 | Disposition: A | Discharge status: Home / Self Care | Payer: Medicaid (Managed Care)

## 2023-11-02 DIAGNOSIS — N3000 Acute cystitis without hematuria: Principal | ICD-10-CM

## 2023-11-02 DIAGNOSIS — R739 Hyperglycemia, unspecified: Principal | ICD-10-CM

## 2023-11-02 MED ORDER — AMOXICILLIN 875 MG-POTASSIUM CLAVULANATE 125 MG TABLET
ORAL_TABLET | Freq: Two times a day (BID) | ORAL | 0 refills | 5.00000 days | Status: CP
Start: 2023-11-02 — End: 2023-11-07
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
## 2023-11-10 MED ORDER — AMLODIPINE 10 MG TABLET
ORAL_TABLET | Freq: Every day | ORAL | 1 refills | 90.00000 days | Status: CP
Start: 2023-11-10 — End: ?

## 2023-11-14 ENCOUNTER — Encounter: Payer: Self-pay | Admitting: Psychiatry

## 2023-11-14 ENCOUNTER — Telehealth: Admitting: Psychiatry

## 2023-11-14 DIAGNOSIS — G4700 Insomnia, unspecified: Secondary | ICD-10-CM

## 2023-11-14 DIAGNOSIS — G2581 Restless legs syndrome: Secondary | ICD-10-CM

## 2023-11-14 DIAGNOSIS — F4310 Post-traumatic stress disorder, unspecified: Secondary | ICD-10-CM

## 2023-11-14 DIAGNOSIS — F330 Major depressive disorder, recurrent, mild: Secondary | ICD-10-CM

## 2023-11-14 MED ORDER — VENLAFAXINE HCL ER 37.5 MG PO CP24: 37.5000 mg | ORAL_CAPSULE | Freq: Every day | ORAL | 0 refills | Status: DC

## 2023-11-14 MED ORDER — VENLAFAXINE HCL ER 75 MG PO CP24: 75.0000 mg | ORAL_CAPSULE | Freq: Every day | ORAL | 0 refills | Status: DC

## 2023-11-14 MED ORDER — ARIPIPRAZOLE 2 MG PO TABS: 2.0000 mg | ORAL_TABLET | Freq: Every day | ORAL | 0 refills | Status: DC

## 2023-11-14 MED ORDER — QUETIAPINE FUMARATE 300 MG PO TABS: 300.0000 mg | ORAL_TABLET | Freq: Every day | ORAL | 0 refills | Status: DC

## 2023-11-17 ENCOUNTER — Other Ambulatory Visit: Payer: Self-pay

## 2023-11-17 ENCOUNTER — Emergency Department

## 2023-11-17 ENCOUNTER — Inpatient Hospital Stay
Admission: EM | Admit: 2023-11-17 | Discharge: 2023-11-22 | DRG: 638 | Disposition: A | Discharge status: Home / Self Care | Attending: Student | Admitting: Student

## 2023-11-17 DIAGNOSIS — N319 Neuromuscular dysfunction of bladder, unspecified: Secondary | ICD-10-CM | POA: Diagnosis present

## 2023-11-17 DIAGNOSIS — I130 Hypertensive heart and chronic kidney disease with heart failure and stage 1 through stage 4 chronic kidney disease, or unspecified chronic kidney disease: Secondary | ICD-10-CM | POA: Diagnosis present

## 2023-11-17 DIAGNOSIS — M059 Rheumatoid arthritis with rheumatoid factor, unspecified: Secondary | ICD-10-CM | POA: Diagnosis present

## 2023-11-17 DIAGNOSIS — E1169 Type 2 diabetes mellitus with other specified complication: Secondary | ICD-10-CM | POA: Diagnosis present

## 2023-11-17 DIAGNOSIS — D631 Anemia in chronic kidney disease: Secondary | ICD-10-CM | POA: Diagnosis present

## 2023-11-17 DIAGNOSIS — Z7951 Long term (current) use of inhaled steroids: Secondary | ICD-10-CM

## 2023-11-17 DIAGNOSIS — G894 Chronic pain syndrome: Secondary | ICD-10-CM | POA: Diagnosis present

## 2023-11-17 DIAGNOSIS — L03311 Cellulitis of abdominal wall: Secondary | ICD-10-CM | POA: Diagnosis present

## 2023-11-17 DIAGNOSIS — Z8614 Personal history of Methicillin resistant Staphylococcus aureus infection: Secondary | ICD-10-CM

## 2023-11-17 DIAGNOSIS — I5032 Chronic diastolic (congestive) heart failure: Secondary | ICD-10-CM | POA: Diagnosis present

## 2023-11-17 DIAGNOSIS — Z8249 Family history of ischemic heart disease and other diseases of the circulatory system: Secondary | ICD-10-CM

## 2023-11-17 DIAGNOSIS — F4310 Post-traumatic stress disorder, unspecified: Secondary | ICD-10-CM | POA: Diagnosis present

## 2023-11-17 DIAGNOSIS — E872 Acidosis, unspecified: Secondary | ICD-10-CM | POA: Diagnosis not present

## 2023-11-17 DIAGNOSIS — F319 Bipolar disorder, unspecified: Secondary | ICD-10-CM | POA: Diagnosis present

## 2023-11-17 DIAGNOSIS — E11628 Type 2 diabetes mellitus with other skin complications: Secondary | ICD-10-CM | POA: Diagnosis present

## 2023-11-17 DIAGNOSIS — I480 Paroxysmal atrial fibrillation: Secondary | ICD-10-CM | POA: Diagnosis present

## 2023-11-17 DIAGNOSIS — Z888 Allergy status to other drugs, medicaments and biological substances status: Secondary | ICD-10-CM

## 2023-11-17 DIAGNOSIS — Z87891 Personal history of nicotine dependence: Secondary | ICD-10-CM

## 2023-11-17 DIAGNOSIS — L03314 Cellulitis of groin: Secondary | ICD-10-CM | POA: Diagnosis present

## 2023-11-17 DIAGNOSIS — K5900 Constipation, unspecified: Secondary | ICD-10-CM | POA: Diagnosis not present

## 2023-11-17 DIAGNOSIS — Z7901 Long term (current) use of anticoagulants: Secondary | ICD-10-CM | POA: Diagnosis not present

## 2023-11-17 DIAGNOSIS — Z882 Allergy status to sulfonamides status: Secondary | ICD-10-CM

## 2023-11-17 DIAGNOSIS — Z6841 Body Mass Index (BMI) 40.0 and over, adult: Secondary | ICD-10-CM | POA: Diagnosis not present

## 2023-11-17 DIAGNOSIS — E1165 Type 2 diabetes mellitus with hyperglycemia: Principal | ICD-10-CM | POA: Diagnosis present

## 2023-11-17 DIAGNOSIS — Z90710 Acquired absence of both cervix and uterus: Secondary | ICD-10-CM

## 2023-11-17 DIAGNOSIS — G4700 Insomnia, unspecified: Secondary | ICD-10-CM | POA: Diagnosis present

## 2023-11-17 DIAGNOSIS — L304 Erythema intertrigo: Secondary | ICD-10-CM | POA: Diagnosis present

## 2023-11-17 DIAGNOSIS — R109 Unspecified abdominal pain: Secondary | ICD-10-CM | POA: Diagnosis present

## 2023-11-17 DIAGNOSIS — E1142 Type 2 diabetes mellitus with diabetic polyneuropathy: Secondary | ICD-10-CM

## 2023-11-17 DIAGNOSIS — Z9981 Dependence on supplemental oxygen: Secondary | ICD-10-CM

## 2023-11-17 DIAGNOSIS — Z79890 Hormone replacement therapy (postmenopausal): Secondary | ICD-10-CM

## 2023-11-17 DIAGNOSIS — J9601 Acute respiratory failure with hypoxia: Secondary | ICD-10-CM

## 2023-11-17 DIAGNOSIS — Z885 Allergy status to narcotic agent status: Secondary | ICD-10-CM

## 2023-11-17 DIAGNOSIS — N179 Acute kidney failure, unspecified: Secondary | ICD-10-CM | POA: Diagnosis not present

## 2023-11-17 DIAGNOSIS — Z79899 Other long term (current) drug therapy: Secondary | ICD-10-CM

## 2023-11-17 DIAGNOSIS — N182 Chronic kidney disease, stage 2 (mild): Secondary | ICD-10-CM | POA: Diagnosis present

## 2023-11-17 DIAGNOSIS — J9611 Chronic respiratory failure with hypoxia: Secondary | ICD-10-CM | POA: Diagnosis present

## 2023-11-17 DIAGNOSIS — J449 Chronic obstructive pulmonary disease, unspecified: Secondary | ICD-10-CM | POA: Diagnosis present

## 2023-11-17 DIAGNOSIS — E1122 Type 2 diabetes mellitus with diabetic chronic kidney disease: Secondary | ICD-10-CM | POA: Diagnosis present

## 2023-11-17 DIAGNOSIS — E66813 Obesity, class 3: Secondary | ICD-10-CM | POA: Diagnosis present

## 2023-11-17 DIAGNOSIS — M25512 Pain in left shoulder: Secondary | ICD-10-CM | POA: Diagnosis present

## 2023-11-17 DIAGNOSIS — E039 Hypothyroidism, unspecified: Secondary | ICD-10-CM | POA: Diagnosis present

## 2023-11-17 DIAGNOSIS — Z79631 Long term (current) use of antimetabolite agent: Secondary | ICD-10-CM

## 2023-11-17 DIAGNOSIS — Z7984 Long term (current) use of oral hypoglycemic drugs: Secondary | ICD-10-CM

## 2023-11-17 DIAGNOSIS — E785 Hyperlipidemia, unspecified: Secondary | ICD-10-CM | POA: Diagnosis present

## 2023-11-17 DIAGNOSIS — R21 Rash and other nonspecific skin eruption: Secondary | ICD-10-CM | POA: Diagnosis present

## 2023-11-17 DIAGNOSIS — Z881 Allergy status to other antibiotic agents status: Secondary | ICD-10-CM

## 2023-11-17 DIAGNOSIS — Z818 Family history of other mental and behavioral disorders: Secondary | ICD-10-CM

## 2023-11-17 DIAGNOSIS — R739 Hyperglycemia, unspecified: Secondary | ICD-10-CM

## 2023-11-17 DIAGNOSIS — Z9049 Acquired absence of other specified parts of digestive tract: Secondary | ICD-10-CM

## 2023-11-17 DIAGNOSIS — L0390 Cellulitis, unspecified: Principal | ICD-10-CM

## 2023-11-17 DIAGNOSIS — Z9884 Bariatric surgery status: Secondary | ICD-10-CM

## 2023-11-17 DIAGNOSIS — Z8616 Personal history of COVID-19: Secondary | ICD-10-CM

## 2023-11-17 DIAGNOSIS — L03818 Cellulitis of other sites: Secondary | ICD-10-CM | POA: Diagnosis not present

## 2023-11-17 DIAGNOSIS — K219 Gastro-esophageal reflux disease without esophagitis: Secondary | ICD-10-CM | POA: Diagnosis present

## 2023-11-17 LAB — CBC WITH DIFFERENTIAL/PLATELET
Abs Immature Granulocytes: 0.05 K/uL (ref 0.00–0.07)
Basophils Absolute: 0 K/uL (ref 0.0–0.1)
Basophils Relative: 1 %
Eosinophils Absolute: 0 K/uL (ref 0.0–0.5)
Eosinophils Relative: 0 %
HCT: 34.2 % — ABNORMAL LOW (ref 36.0–46.0)
Hemoglobin: 10.2 g/dL — ABNORMAL LOW (ref 12.0–15.0)
Immature Granulocytes: 1 %
Lymphocytes Relative: 14 %
Lymphs Abs: 1.2 K/uL (ref 0.7–4.0)
MCH: 22.4 pg — ABNORMAL LOW (ref 26.0–34.0)
MCHC: 29.8 g/dL — ABNORMAL LOW (ref 30.0–36.0)
MCV: 75 fL — ABNORMAL LOW (ref 80.0–100.0)
Monocytes Absolute: 0.2 K/uL (ref 0.1–1.0)
Monocytes Relative: 2 %
Neutro Abs: 7.1 K/uL (ref 1.7–7.7)
Neutrophils Relative %: 82 %
Platelets: 292 K/uL (ref 150–400)
RBC: 4.56 MIL/uL (ref 3.87–5.11)
RDW: 20.6 % — ABNORMAL HIGH (ref 11.5–15.5)
WBC: 8.6 K/uL (ref 4.0–10.5)
nRBC: 0 % (ref 0.0–0.2)

## 2023-11-17 LAB — BASIC METABOLIC PANEL WITH GFR
Anion gap: 9 (ref 5–15)
BUN: 16 mg/dL (ref 8–23)
CO2: 24 mmol/L (ref 22–32)
Calcium: 8.8 mg/dL — ABNORMAL LOW (ref 8.9–10.3)
Chloride: 99 mmol/L (ref 98–111)
Creatinine, Ser: 1.22 mg/dL — ABNORMAL HIGH (ref 0.44–1.00)
GFR, Estimated: 50 mL/min — ABNORMAL LOW (ref >60)
Glucose, Bld: 290 mg/dL — ABNORMAL HIGH (ref 70–99)
Potassium: 3.6 mmol/L (ref 3.5–5.1)
Sodium: 132 mmol/L — ABNORMAL LOW (ref 135–145)

## 2023-11-17 LAB — BLOOD GAS, VENOUS
Acid-base deficit: 1.5 mmol/L (ref 0.0–2.0)
Bicarbonate: 24.3 mmol/L (ref 20.0–28.0)
O2 Saturation: 62 %
Patient temperature: 37
pCO2, Ven: 44 mmHg (ref 44–60)
pH, Ven: 7.35 (ref 7.25–7.43)
pO2, Ven: 35 mmHg (ref 32–45)

## 2023-11-17 LAB — URINALYSIS, ROUTINE W REFLEX MICROSCOPIC
Bacteria, UA: NONE SEEN
Bilirubin Urine: NEGATIVE
Glucose, UA: >=500 mg/dL — AB
Hgb urine dipstick: NEGATIVE
Ketones, ur: NEGATIVE mg/dL
Leukocytes,Ua: NEGATIVE
Nitrite: NEGATIVE
Protein, ur: NEGATIVE mg/dL
Specific Gravity, Urine: 1.037 — ABNORMAL HIGH (ref 1.005–1.030)
pH: 5 (ref 5.0–8.0)

## 2023-11-17 LAB — COMPREHENSIVE METABOLIC PANEL WITH GFR
ALT: 27 U/L (ref 0–44)
AST: 21 U/L (ref 15–41)
Albumin: 3.2 g/dL — ABNORMAL LOW (ref 3.5–5.0)
Alkaline Phosphatase: 97 U/L (ref 38–126)
Anion gap: 10 (ref 5–15)
BUN: 15 mg/dL (ref 8–23)
CO2: 22 mmol/L (ref 22–32)
Calcium: 8.6 mg/dL — ABNORMAL LOW (ref 8.9–10.3)
Chloride: 93 mmol/L — ABNORMAL LOW (ref 98–111)
Creatinine, Ser: 1.32 mg/dL — ABNORMAL HIGH (ref 0.44–1.00)
GFR, Estimated: 46 mL/min — ABNORMAL LOW (ref >60)
Glucose, Bld: 749 mg/dL (ref 70–99)
Potassium: 4.2 mmol/L (ref 3.5–5.1)
Sodium: 125 mmol/L — ABNORMAL LOW (ref 135–145)
Total Bilirubin: 0.6 mg/dL (ref 0.0–1.2)
Total Protein: 7.6 g/dL (ref 6.5–8.1)

## 2023-11-17 LAB — LACTIC ACID, PLASMA
Lactic Acid, Venous: 2.9 mmol/L (ref 0.5–1.9)
Lactic Acid, Venous: 2.5 mmol/L (ref 0.5–1.9)

## 2023-11-17 LAB — BETA-HYDROXYBUTYRIC ACID: Beta-Hydroxybutyric Acid: 0.06 mmol/L (ref 0.05–0.27)

## 2023-11-17 LAB — CBG MONITORING, ED
Glucose-Capillary: >600 mg/dL (ref 70–99)
Glucose-Capillary: >600 mg/dL (ref 70–99)
Glucose-Capillary: 494 mg/dL — ABNORMAL HIGH (ref 70–99)
Glucose-Capillary: 529 mg/dL (ref 70–99)
Glucose-Capillary: 419 mg/dL — ABNORMAL HIGH (ref 70–99)
Glucose-Capillary: 360 mg/dL — ABNORMAL HIGH (ref 70–99)
Glucose-Capillary: 270 mg/dL — ABNORMAL HIGH (ref 70–99)
Glucose-Capillary: 214 mg/dL — ABNORMAL HIGH (ref 70–99)
Glucose-Capillary: 198 mg/dL — ABNORMAL HIGH (ref 70–99)
Glucose-Capillary: 202 mg/dL — ABNORMAL HIGH (ref 70–99)

## 2023-11-17 LAB — GLUCOSE, CAPILLARY: Glucose-Capillary: 213 mg/dL — ABNORMAL HIGH (ref 70–99)

## 2023-11-17 LAB — PROTIME-INR
INR: 1.1 (ref 0.8–1.2)
Prothrombin Time: 14.7 s (ref 11.4–15.2)

## 2023-11-17 MED ORDER — PIPERACILLIN-TAZOBACTAM 3.375 G IVPB
3.3750 g | Freq: Once | INTRAVENOUS | Status: AC
  Administered 2023-11-17: 3.375 g via INTRAVENOUS
  Filled 2023-11-17: qty 50

## 2023-11-17 MED ORDER — VANCOMYCIN HCL IN DEXTROSE 1-5 GM/200ML-% IV SOLN: 1000.0000 mg | Freq: Once | INTRAVENOUS | Status: DC

## 2023-11-17 MED ORDER — TRAMADOL 5 MG/ML ORAL SUSPENSION: 50.0000 mg | Freq: Four times a day (QID) | ORAL | Status: DC | PRN

## 2023-11-17 MED ORDER — PIPERACILLIN-TAZOBACTAM 3.375 G IVPB
3.3750 g | Freq: Three times a day (TID) | INTRAVENOUS | Status: DC
  Administered 2023-11-17 – 2023-11-18 (×3): 3.375 g via INTRAVENOUS
  Filled 2023-11-17 (×5): qty 50

## 2023-11-17 MED ORDER — DEXTROSE IN LACTATED RINGERS 5 % IV SOLN: INTRAVENOUS | Status: DC

## 2023-11-17 MED ORDER — SODIUM CHLORIDE 0.9 % IV BOLUS
1000.0000 mL | Freq: Once | INTRAVENOUS | Status: AC
  Administered 2023-11-17: 1000 mL via INTRAVENOUS

## 2023-11-17 MED ORDER — INSULIN REGULAR(HUMAN) IN NACL 100-0.9 UT/100ML-% IV SOLN
INTRAVENOUS | Status: DC
  Administered 2023-11-17: 13 [IU]/h via INTRAVENOUS
  Administered 2023-11-18: 9 [IU]/h via INTRAVENOUS
  Filled 2023-11-17 (×2): qty 100

## 2023-11-17 MED ORDER — ONDANSETRON HCL 4 MG/2ML IJ SOLN
4.0000 mg | Freq: Once | INTRAMUSCULAR | Status: DC | PRN
  Filled 2023-11-17: qty 2

## 2023-11-17 MED ORDER — HYDROMORPHONE HCL 1 MG/ML IJ SOLN
0.5000 mg | Freq: Once | INTRAMUSCULAR | Status: AC
  Administered 2023-11-17: 0.5 mg via INTRAVENOUS
  Filled 2023-11-17: qty 0.5

## 2023-11-17 MED ORDER — LACTATED RINGERS IV SOLN: INTRAVENOUS | Status: AC

## 2023-11-17 MED ORDER — VANCOMYCIN HCL 2000 MG/400ML IV SOLN
2000.0000 mg | Freq: Once | INTRAVENOUS | Status: AC
  Administered 2023-11-17: 2000 mg via INTRAVENOUS
  Filled 2023-11-17: qty 400

## 2023-11-17 MED ORDER — NYSTATIN 100000 UNIT/GM EX POWD
Freq: Once | CUTANEOUS | Status: AC
  Filled 2023-11-17: qty 15

## 2023-11-17 MED ORDER — IOHEXOL 300 MG/ML  SOLN
100.0000 mL | Freq: Once | INTRAMUSCULAR | Status: AC | PRN
Start: 2023-11-17 — End: 2023-11-17
  Administered 2023-11-17: 100 mL via INTRAVENOUS

## 2023-11-17 MED ORDER — FLUCONAZOLE 100 MG PO TABS
200.0000 mg | ORAL_TABLET | Freq: Every day | ORAL | Status: DC
  Administered 2023-11-17 – 2023-11-18 (×2): 200 mg via ORAL
  Filled 2023-11-17 (×2): qty 2

## 2023-11-17 MED ORDER — LIDOCAINE HCL URETHRAL/MUCOSAL 2 % EX GEL: 1.0000 | Freq: Once | CUTANEOUS | Status: DC

## 2023-11-17 MED ORDER — VANCOMYCIN HCL 1750 MG/350ML IV SOLN
1750.0000 mg | INTRAVENOUS | Status: DC
  Administered 2023-11-18: 1750 mg via INTRAVENOUS
  Filled 2023-11-17 (×2): qty 350

## 2023-11-17 MED ORDER — DEXTROSE 50 % IV SOLN: 0.0000 mL | INTRAVENOUS | Status: DC | PRN

## 2023-11-17 MED ORDER — TRAMADOL HCL 50 MG PO TABS
50.0000 mg | ORAL_TABLET | Freq: Four times a day (QID) | ORAL | Status: DC | PRN
  Administered 2023-11-17 – 2023-11-18 (×2): 50 mg via ORAL
  Filled 2023-11-17 (×3): qty 1

## 2023-11-17 MED ORDER — ALBUTEROL SULFATE (2.5 MG/3ML) 0.083% IN NEBU
2.5000 mg | INHALATION_SOLUTION | RESPIRATORY_TRACT | Status: DC | PRN
  Administered 2023-11-19: 2.5 mg via RESPIRATORY_TRACT
  Filled 2023-11-17 (×2): qty 3

## 2023-11-17 NOTE — ED Notes (Addendum)
 Patient has asked for pain medications prior to having the lidocaine  being put on the raw skin due to the amount of pain she is already in and knows it will cause more when touched.  I have requested pain medication from providers through messenger.  Waiting on this prior to applying lidocaine  per patients request. Patient is very anxious about this and I have talked her through a panic attack.

## 2023-11-17 NOTE — Consult Note (Signed)
 Pharmacy Antibiotic Note  ASSESSMENT: 63 y.o. female with PMH including osteomyelitis, CKD, T2DM, COPD is presenting with cellulitis and wound infection. Patient's renal function appears to be at baseline of Scr 1.1-1.3. Pharmacy has been consulted to manage vancomycin  dosing.  PLAN: Administer vancomycin  2000mg  IV x 1 as a loading dose, followed by vancomycin  1750mg  IV q24H eAUC 538, Cmax 35, Cmin 14 Scr 1.22, IBW, Vd 0.5 L/kg (BMI 55.7) Follow up culture results to assess for antibiotic optimization. Monitor renal function to assess for any necessary antibiotic dosing changes.  Patient measurements: Height: 5' 6 (167.6 cm) Weight: (!) 156.5 kg (345 lb) IBW/kg (Calculated) : 59.3  Vital signs: Temp: 98.1 F (36.7 C) (07/31 1929) Temp Source: Oral (07/31 1929) BP: 113/77 (07/31 1930) Pulse Rate: 79 (07/31 1930) Recent Labs  Lab 11/17/23 1326 11/17/23 1943  WBC 8.6  --   CREATININE 1.32* 1.22*   Estimated Creatinine Clearance: 74.1 mL/min (A) (by C-G formula based on SCr of 1.22 mg/dL (H)).  Allergies: Allergies  Allergen Reactions   Cefepime  Anaphylaxis and Swelling    lips swell and the break out in blisters.  States lip swelling upon receiving it   Ceftriaxone  Anaphylaxis    Angioedema while admitted at [facility] 01/12/2023 Tolerated amoxicillin     Cephalexin  Hives   Codeine  Palpitations, Nausea Only, Nausea And Vomiting, Rash and Shortness Of Breath    makes heart fly, she gets flushed and passes out   Doxycycline Rash   Propoxyphene Rash and Shortness Of Breath    Increase heart rate   Sulfa Antibiotics Palpitations, Nausea Only, Shortness Of Breath and Hives    makes heart fly, she gets flushed and passes out   Clindamycin  Rash    Tongue swelling, oral sores, and Mouth rash   Daptomycin  Other (See Comments)    Fever and pulmonary infiltrate   Lovenox  [Enoxaparin  Sodium] Hives   Hydrocodone Nausea And Vomiting    Hear racing & breaks out into a cold sweat.    Linezolid  Dermatitis    Swelling and rash around eyes   Meropenem Rash    Erythematous, hot, pruritic rash over arms, chest, back, abdomen, and face occurred at the end of meropenem infusion on 02/22/18    Antimicrobials this admission: Zosyn  7/31 >> Fluconazole  7/31 >> Vancomycin  7/31 >>   Dose adjustments this admission: N/A  Microbiology results: 7/31 BCx: sent  Thank you for allowing pharmacy to be a part of this patient's care.  Will M. Lenon, PharmD Clinical Pharmacist 11/17/2023 8:29 PM

## 2023-11-17 NOTE — ED Provider Notes (Signed)
-----------------------------------------   5:03 PM on 11/17/2023 ----------------------------------------- Patient care assumed from Dr. Waymond.  CT scan shows no significant finding.  However patient remains with significant hyperglycemia on insulin  infusion with signs of panniculitis elevated lactic acidosis receiving IV antibiotics as well as antifungal medications.  Will admit to the hospitalist for further workup and treatment.   Dorothyann Drivers, MD 11/17/23 1704

## 2023-11-17 NOTE — ED Notes (Signed)
 This RN called lab to obtain second set of cultures

## 2023-11-17 NOTE — ED Notes (Signed)
 Fall bundle in place. Pt has arm band, non slip socks, bed alarm is on and fall sign outside of door.

## 2023-11-17 NOTE — ED Triage Notes (Signed)
 Pt BIB [facility] from home c/o a rash underneath her belly that formed a few days ago. Pt endorses burning from rash. Wears 2L at baseline [place]. AXO  150/70 80 HR 98.3 Oral 600 cbg

## 2023-11-17 NOTE — ED Provider Notes (Signed)
 [facility] Provider Note    Event Date/Time   First MD Initiated Contact with Patient 11/17/23 1320     (approximate)   History   Rash   HPI  [name] [name] is a 63 y.o. female with history of diabetes, CHF, COPD on 2 L at baseline, bipolar disorder, RA, presenting with pelvic rash.  Patient states that started several days ago, feels that it is burning sensation.  No nausea vomiting or diarrhea, no urinary symptoms or fever.  She denies any chest pain or shortness of breath, no unilateral leg swelling, no abdominal pain or back pain.  Per independent history from EMS, the blood glucose was 600.  She was afebrile for them.   On independent review, she is seen by psychiatry at the end of July, has history of PTSD, depression and insomnia, denies any SI HI for them, is continued on most of her meds and started on venlafaxine .  Physical Exam   Triage Vital Signs: ED Triage Vitals  Encounter Vitals Group     BP --      Girls Systolic BP Percentile --      Girls Diastolic BP Percentile --      Boys Systolic BP Percentile --      Boys Diastolic BP Percentile --      Pulse --      Resp --      Temp --      Temp src --      SpO2 --      Weight 11/17/23 1325 (!) 345 lb (156.5 kg)     Height 11/17/23 1325 5' 6 (1.676 m)     Head Circumference --      Peak Flow --      Pain Score 11/17/23 1324 10     Pain Loc --      Pain Education --      Exclude from Growth Chart --     Most recent vital signs: Vitals:   11/17/23 1326  BP: 139/82  Pulse: 85  Resp: 18  Temp: 98.4 F (36.9 C)  SpO2: 96%     General: Awake, no distress.  CV:  Good peripheral perfusion.  Resp:  Normal effort.  No tachypnea or respiratory distress Abd:  No distention.  Soft nontender Other:  She has an erythematous rash to her pelvis, does not extend into her groin, it is tender to palpation, no crepitus   ED Results / Procedures / Treatments   Labs (all labs ordered  are listed, but only abnormal results are displayed) Labs Reviewed  COMPREHENSIVE METABOLIC PANEL WITH GFR - Abnormal; Notable for the following components:      Result Value   Sodium 125 (*)    Chloride 93 (*)    Glucose, Bld 749 (*)    Creatinine, Ser 1.32 (*)    Calcium  8.6 (*)    Albumin  3.2 (*)    GFR, Estimated 46 (*)    All other components within normal limits  CBC WITH DIFFERENTIAL/PLATELET - Abnormal; Notable for the following components:   Hemoglobin 10.2 (*)    HCT 34.2 (*)    MCV 75.0 (*)    MCH 22.4 (*)    MCHC 29.8 (*)    RDW 20.6 (*)    All other components within normal limits  LACTIC ACID, PLASMA - Abnormal; Notable for the following components:   Lactic Acid, Venous 2.9 (*)    All other components within  normal limits  CBG MONITORING, ED - Abnormal; Notable for the following components:   Glucose-Capillary >600 (*)    All other components within normal limits  CULTURE, BLOOD (ROUTINE X 2)  CULTURE, BLOOD (ROUTINE X 2)  BLOOD GAS, VENOUS  PROTIME-INR  BETA-HYDROXYBUTYRIC ACID  LACTIC ACID, PLASMA  URINALYSIS, ROUTINE W REFLEX MICROSCOPIC  BASIC METABOLIC PANEL WITH GFR  BASIC METABOLIC PANEL WITH GFR     EKG  EKG shows, EKG shows sinus rhythm, rate 75, normal QS, normal QTc, no obvious ischemic ST elevation, T wave flattening in 3, not significant compared to prior    PROCEDURES:  Critical Care performed: Yes, see critical care procedure note(s)  .Critical Care  Performed by: Waymond Lorelle Cummins, MD Authorized by: Waymond Lorelle Cummins, MD   Critical care provider statement:    Critical care time (minutes):  40   Critical care was time spent personally by me on the following activities:  Development of treatment plan with patient or surrogate, discussions with consultants, evaluation of patient's response to treatment, examination of patient, ordering and review of laboratory studies, ordering and review of radiographic studies, ordering and performing  treatments and interventions, pulse oximetry, re-evaluation of patient's condition and review of old charts    MEDICATIONS ORDERED IN ED: Medications  vancomycin  (VANCOREADY) IVPB 2000 mg/400 mL (has no administration in time range)  ondansetron  (ZOFRAN ) injection 4 mg (has no administration in time range)  insulin  regular, human (MYXREDLIN ) 100 units/ 100 mL infusion (has no administration in time range)  lactated ringers  infusion (has no administration in time range)  dextrose  5 % in lactated ringers  infusion (has no administration in time range)  dextrose  50 % solution 0-50 mL (has no administration in time range)  sodium chloride  0.9 % bolus 1,000 mL (1,000 mLs Intravenous New Bag/Given 11/17/23 1353)  nystatin  (MYCOSTATIN /NYSTOP ) topical powder ( Topical Given 11/17/23 1433)  piperacillin -tazobactam (ZOSYN ) IVPB 3.375 g (0 g Intravenous Stopped 11/17/23 1455)  HYDROmorphone  (DILAUDID ) injection 0.5 mg (0.5 mg Intravenous Given 11/17/23 1453)  iohexol  (OMNIPAQUE ) 300 MG/ML solution 100 mL (100 mLs Intravenous Contrast Given 11/17/23 1503)     IMPRESSION / MDM / ASSESSMENT AND PLAN / ED COURSE  I reviewed the triage vital signs and the nursing notes.                              Differential diagnosis includes, but is not limited to, cellulitis, fungal infection, considered deep space infection she has no crepitus on exam, rash is tender but does not appear to be pain at proportion.  For her hyperglycemia, considered DKA.  Will get labs, CT pelvis, will start her on some antibiotics here.  IV fluids.  Patient's presentation is most consistent with acute presentation with potential threat to life or bodily function.  Independent interpretation of labs and imaging below.  Patient with hyperglycemia, cellulitis.  Started on IV antibiotics, will give her some IV morphine  for pain.  Patient signed out pending CT imaging results and admission.  The patient is on the cardiac monitor to evaluate  for evidence of arrhythmia and/or significant heart rate changes.   Clinical Course as of 11/17/23 1512  Thu Nov 17, 2023  1447 Independent review of labs, lactate is elevated to 2.9, beta hydroxybutyrate is not elevated, INR is normal, no leukocytosis, H&H is stable, she has some hyponatremia with hyperglycemia without acidosis on VBG.  Given how profound her hypoglycemia is, I  will start her on Endo tool. [TT]    Clinical Course User Index [TT] Waymond Lorelle Cummins, MD     FINAL CLINICAL IMPRESSION(S) / ED DIAGNOSES   Final diagnoses:  Cellulitis, unspecified cellulitis site  Hyperglycemia     Rx / DC Orders   ED Discharge Orders     None        Note:  This document was prepared using Dragon voice recognition software and may include unintentional dictation errors.    Waymond Lorelle Cummins, MD 11/17/23 [phone]

## 2023-11-17 NOTE — Progress Notes (Signed)
 ED Pharmacy Antibiotic Sign Off An antibiotic consult was received from an ED provider for vancomycin  and  per pharmacy dosing for cellulitis. A chart review was completed to assess appropriateness.   The following one time order(s) were placed:   vancomycin  2000 mg IV x 1   Further antibiotic and/or antibiotic pharmacy consults should be ordered by the admitting provider if indicated.   Thank you for allowing pharmacy to be a part of this patient's care.   Adriana JONETTA Bolster, [facility]  Clinical Pharmacist 11/17/23 2:14 PM

## 2023-11-17 NOTE — H&P (Signed)
 History and Physical    [name] [name] FMW:[id number] DOB: 01-04-1961 DOA: 11/17/2023  PCP: Geralene Levorn ORN, NP  Patient coming from: home  I have personally briefly reviewed patient's old medical records in [facility]  Chief Complaint: rash  HPI: [name] [name] is a 63 y.o. female with medical history significant of osteomyelitis of lower extremity (MRSA, morganella, and proteus isolated) s/p multiple podiatry surgeries/toe amputations, CKD, T2DM, HFpEF, pAF, COPD (3-4L baseline), hypothyroidism, iron deficiency anemia, seropositive RA, bipolar, chronic pain syndrome who presents to ED BIB EMS due to expanding rash in b/l groin x 2-3days.   Patient notes associated burning in area of rash. Patient notes no associated n/v/d/ dysuria/fever/chills with rash. ON further ros also denies chest pain sob / abdominal pain.   ED Course:  On evaluation in ED patient was found to have abdominal cellulitis as well as severe hyperglycemia w/o ketosis.  Patient was started on antibiotics as well as insulin  drip and slated for admission. Vitals: afeb bp 139/82, hr 85, rr 18, sat 96%   Labs Wbc 8.6, hgb 10.2, plt 292  Na 125 ( 141), K 4.2 , Cl 93, glu 749, cr 1.32 Betahydroxy 0.06 Vbg:7.35, /44 Lactic 2.9 CTAB IMPRESSION: 1. Chronic postoperative changes in the anterior abdominal wall with small ventral hernia containing fat. Focal nodule or collection to the right of midline in the anterior abdominal wall fat is unchanged since prior study, likely postoperative or sebaceous cyst. 2. No acute infiltration, skin thickening, collection, or gas to suggest cellulitis or soft tissue abscess.   TX NS-1L, zosyn /nyastatin, dilaudid , insulin  drip   Review of Systems: As per HPI otherwise 10 point review of systems negative.   Past Medical History:  Diagnosis Date   Abdominal pain 02/12/2019   Abdominal wall hernia 01/29/2013   Acute metabolic encephalopathy 07/08/2019   AMS (altered  mental status) 12/28/2021   Anxiety    Arthritis    Rheumatoid   C. difficile colitis    Chronic diastolic heart failure (HCC)    COVID-19 03/23/2019   Diagnosed at [facility] (send-out) on 03/23/2019   Depression    Diabetes mellitus    states no meds or diet restrictions  at present   Diastolic CHF (HCC)    Esophagitis    Fluid retention    GERD (gastroesophageal reflux disease)    Hiatal hernia    Hypertension    Hypokalemia due to loss of potassium 10/21/2015   Overview:  Associated with 3 weeks of diarrhea  And QT prolongation.   Hypothyroidism    IBS (irritable bowel syndrome)    Moderate episode of recurrent major depressive disorder (HCC) 06/03/2004   Morbid obesity (HCC)    MRSA (methicillin resistant Staphylococcus aureus) infection 11/2017   left inner thigh abcess   Neurogenic bladder    has pacemaker   Neuropathy    Obesity    Panic attacks    Pneumonia due to COVID-19 virus    Rheumatoid arthritis (HCC)    Sleep apnea    STATES SEVERE, CANT TOLERATE MASK- LAST STUDY YEARS AGO    Past Surgical History:  Procedure Laterality Date   ABDOMINAL HYSTERECTOMY     CHOLECYSTECTOMY     DG GREAT TOE RIGHT FOOT  02/23/2018   EYE SURGERY     bilateral cataract extraction with IOL   FOOT SURGERY Left 05/13/2021   [facility]   HERNIA REPAIR     ventral hernia with strangulation   IRRIGATION AND DEBRIDEMENT FOOT  Left 01/11/2020   Procedure: IRRIGATION AND DEBRIDEMENT FOOT;  Surgeon: Ashley Soulier, DPM;  Location: [facility] ORS;  Service: Podiatry;  Laterality: Left;   LAPAROSCOPIC GASTRIC BANDING  03/20/2007   TEE WITHOUT CARDIOVERSION N/A 07/16/2019   Procedure: TRANSESOPHAGEAL ECHOCARDIOGRAM (TEE);  Surgeon: Darliss Rogue, MD;  Location: [facility] ORS;  Service: Cardiovascular;  Laterality: N/A;   TONSILLECTOMY     TUBAL LIGATION       reports that she quit smoking about 24 years ago. Her smoking use included cigarettes. She started smoking about 51 years ago. She has a 54 pack-year  smoking history. She has never used smokeless tobacco. She reports that she does not drink alcohol and does not use drugs.  Allergies  Allergen Reactions   Cefepime  Anaphylaxis and Swelling    lips swell and the break out in blisters.  States lip swelling upon receiving it   Ceftriaxone  Anaphylaxis    Angioedema while admitted at [facility] 01/12/2023 Tolerated amoxicillin     Cephalexin  Hives   Codeine  Palpitations, Nausea Only, Nausea And Vomiting, Rash and Shortness Of Breath    makes heart fly, she gets flushed and passes out   Doxycycline Rash   Propoxyphene Rash and Shortness Of Breath    Increase heart rate   Sulfa Antibiotics Palpitations, Nausea Only, Shortness Of Breath and Hives    makes heart fly, she gets flushed and passes out   Clindamycin  Rash    Tongue swelling, oral sores, and Mouth rash   Daptomycin  Other (See Comments)    Fever and pulmonary infiltrate   Lovenox  [Enoxaparin  Sodium] Hives   Hydrocodone Nausea And Vomiting    Hear racing & breaks out into a cold sweat.   Meropenem Rash    Erythematous, hot, pruritic rash over arms, chest, back, abdomen, and face occurred at the end of meropenem infusion on 02/22/18    Family History  Problem Relation Age of Onset   Heart failure Father    Bipolar disorder Father    Alcohol abuse Father    Anxiety disorder Father    Depression Father    Heart disease Brother    Heart attack Brother 22       MI s/p stents placed   Anxiety disorder Sister    Depression Sister    Anxiety disorder Sister    Depression Sister    Bipolar disorder Sister    Alcohol abuse Sister    Drug abuse Sister    Heart attack Brother     Prior to Admission medications   Medication Sig Start Date End Date Taking? Authorizing Provider  acetaminophen  (TYLENOL ) 325 MG tablet Take 2 tablets (650 mg total) by mouth every 6 (six) hours as needed for mild pain or moderate pain (or Fever >/= 101). 01/15/20   Sherrill Cable Latif, DO  albuterol   (VENTOLIN  HFA) 108 [phone] Base) MCG/ACT inhaler Inhale 2 puffs into the lungs every 6 (six) hours as needed for wheezing or shortness of breath. 03/21/23   Tamea Dedra CROME, MD  amLODipine  (NORVASC ) 10 MG tablet Take 10 mg by mouth daily. 12/10/21   [provider]  apixaban  (ELIQUIS ) 5 MG TABS tablet Take 1 tablet (5 mg total) by mouth 2 (two) times daily. 08/25/23   Tamea Dedra CROME, MD  ARIPiprazole  (ABILIFY ) 2 MG tablet Take 1 tablet (2 mg total) by mouth at bedtime. 11/29/23 12/29/23  Vickey Mettle, MD  ascorbic acid  (VITAMIN C ) 500 MG tablet Take 1 tablet (500 mg total) by mouth daily. 05/05/19  Tobie Yetta HERO, MD  atorvastatin  (LIPITOR ) 80 MG tablet Take 80 mg by mouth daily. 12/10/21   [provider]  Budeson-Glycopyrrol-Formoterol  (BREZTRI  AEROSPHERE) 160-9-4.8 MCG/ACT AERO Inhale 2 puffs into the lungs in the morning and at bedtime. 10/01/21   Tamea Dedra CROME, MD  Calcium  Citrate-Vitamin D3 (GNP CALCIUM  CITRATE+D MAXIMUM) 315-6.25 MG-MCG TABS Take 1 tablet by mouth daily. 12/10/21   [provider]  Cholecalciferol  (VITAMIN D ) 125 MCG (5000 UT) CAPS Take 1 capsule by mouth daily. 11/29/19   [provider]  famotidine  (PEPCID ) 20 MG tablet Take 1 tablet (20 mg total) by mouth daily. 05/05/19   Tobie Yetta HERO, MD  folic acid  (FOLVITE ) 1 MG tablet Take 1 mg by mouth daily.     [provider]  hydroxychloroquine  (PLAQUENIL ) 200 MG tablet Take 200 mg by mouth 2 (two) times daily. 12/10/21   [provider]  JARDIANCE 10 MG TABS tablet Take 10 mg by mouth daily. 08/21/23   [provider]  levothyroxine  (SYNTHROID , LEVOTHROID) 88 MCG tablet Take 88 mcg by mouth daily before breakfast.    [provider]  metFORMIN  (GLUCOPHAGE -XR) 500 MG 24 hr tablet Take 500 mg by mouth daily. 12/31/19   [provider]  methotrexate  (RHEUMATREX) 2.5 MG tablet Take 15 mg by mouth once a week. Sunday    [provider]   metoCLOPramide  (REGLAN ) 5 MG/5ML solution Take 5 mg by mouth 4 (four) times daily -  before meals and at bedtime. 12/20/21   [provider]  metoprolol  tartrate (LOPRESSOR ) 25 MG tablet Take 25 mg by mouth 2 (two) times daily. 10/10/20   [provider]  ondansetron  (ZOFRAN -ODT) 4 MG disintegrating tablet Take 4 mg by mouth every 8 (eight) hours as needed. 02/23/23   [provider]  pramipexole  (MIRAPEX ) 0.5 MG tablet Take 0.5 mg by mouth at bedtime. 03/23/22   [provider]  pregabalin  (LYRICA ) 100 MG capsule Take 1 capsule (100 mg total) by mouth 3 (three) times daily. 05/20/22   Marcelino Nurse, MD  promethazine  (PHENERGAN ) 12.5 MG tablet Take 12.5 mg by mouth. 02/26/22   [provider]  QUEtiapine  (SEROQUEL ) 300 MG tablet Take 1 tablet (300 mg total) by mouth at bedtime. 11/29/23 02/27/24  Vickey Mettle, MD  TRADJENTA  5 MG TABS tablet Take 5 mg by mouth daily. 12/31/19   [provider]  traZODone  (DESYREL ) 100 MG tablet Take 1 tablet (100 mg total) by mouth at bedtime as needed for sleep. 10/22/23 01/20/24  Vickey Mettle, MD  venlafaxine  XR (EFFEXOR -XR) 37.5 MG 24 hr capsule Take 1 capsule (37.5 mg total) by mouth daily with breakfast for 7 days. 11/14/23 11/21/23  Hisada, Reina, MD  venlafaxine  XR (EFFEXOR -XR) 75 MG 24 hr capsule Take 1 capsule (75 mg total) by mouth daily with breakfast. Start after completing 37.5 mg daily for one week 11/21/23 12/21/23  Vickey Mettle, MD  zinc  sulfate 220 (50 Zn) MG capsule Take 220 mg by mouth at bedtime.  12/31/19   [provider]    Physical Exam: Vitals:   11/17/23 1325 11/17/23 1326 11/17/23 1630  BP:  139/82 137/74  Pulse:  85 78  Resp:  18 15  Temp:  98.4 F (36.9 C)   TempSrc:  Oral   SpO2:  96% 100%  Weight: (!) 156.5 kg    Height: 5' 6 (1.676 m)      Constitutional: NAD, calm, comfortable Vitals:   11/17/23 1325 11/17/23 1326 11/17/23 1630  BP:  139/82 137/74  Pulse:  85 78  Resp:   18 15  Temp:  98.4 F (36.9 C)   TempSrc:  Oral   SpO2:  96% 100%  Weight: (!) 156.5 kg    Height: 5' 6 (1.676 m)     Eyes: PERRL, lids and conjunctivae normal ENMT: Mucous membranes are moist. Posterior pharynx clear of any exudate or lesions.Normal dentition.  Neck: normal, supple, no masses, no thyromegaly Respiratory: clear to auscultation bilaterally, no wheezing, no crackles. Normal respiratory effort. No accessory muscle use.  Cardiovascular: Regular rate and rhythm, no murmurs / rubs / gallops. No extremity edema. 2+ pedal pulses. No carotid bruits.  Abdomen: no tenderness, no masses palpated. No hepatosplenomegaly. Bowel sounds positive.  Musculoskeletal: no clubbing / cyanosis. No joint deformity upper and lower extremities. Good ROM, no contractures. Normal muscle tone.  Skin: b/l groin area rash with area of skin abrasion, tender to touch (severe intertrigo), no lesions, ulcers. No induration Neurologic: CNgrossly intact. Sensation intact,MAEX4.  Psychiatric: Normal judgment and insight. Alert and oriented x 3. Normal mood.    Labs on Admission: I have personally reviewed following labs and imaging studies  CBC: Recent Labs  Lab 11/17/23 1326  WBC 8.6  NEUTROABS 7.1  HGB 10.2*  HCT 34.2*  MCV 75.0*  PLT 292   Basic Metabolic Panel: Recent Labs  Lab 11/17/23 1326  NA 125*  K 4.2  CL 93*  CO2 22  GLUCOSE 749*  BUN 15  CREATININE 1.32*  CALCIUM  8.6*   GFR: Estimated Creatinine Clearance: 68.5 mL/min (A) (by C-G formula based on SCr of 1.32 mg/dL (H)). Liver Function Tests: Recent Labs  Lab 11/17/23 1326  AST 21  ALT 27  ALKPHOS 97  BILITOT 0.6  PROT 7.6  ALBUMIN  3.2*   No results for input(s): LIPASE, AMYLASE in the last 168 hours. No results for input(s): AMMONIA in the last 168 hours. Coagulation Profile: Recent Labs  Lab 11/17/23 1326  INR 1.1   Cardiac Enzymes: No results for input(s): CKTOTAL, CKMB, CKMBINDEX, TROPONINI  in the last 168 hours. BNP (last 3 results) No results for input(s): PROBNP in the last 8760 hours. HbA1C: No results for input(s): HGBA1C in the last 72 hours. CBG: Recent Labs  Lab 11/17/23 1350 11/17/23 1556 11/17/23 1636 11/17/23 1711  GLUCAP >600* >600* 529* 494*   Lipid Profile: No results for input(s): CHOL, HDL, LDLCALC, TRIG, CHOLHDL, LDLDIRECT in the last 72 hours. Thyroid Function Tests: No results for input(s): TSH, T4TOTAL, FREET4, T3FREE, THYROIDAB in the last 72 hours. Anemia Panel: No results for input(s): VITAMINB12, FOLATE, FERRITIN, TIBC, IRON, RETICCTPCT in the last 72 hours. Urine analysis:    Component Value Date/Time   COLORURINE AMBER (A) 01/31/2023 1523   APPEARANCEUR CLOUDY (A) 01/31/2023 1523   APPEARANCEUR Clear 11/30/2013 1010   LABSPEC 1.024 01/31/2023 1523   LABSPEC 1.015 11/30/2013 1010   PHURINE 5.0 01/31/2023 1523   GLUCOSEU NEGATIVE 01/31/2023 1523   GLUCOSEU >=500 11/30/2013 1010   HGBUR NEGATIVE 01/31/2023 1523   BILIRUBINUR NEGATIVE 01/31/2023 1523   BILIRUBINUR Negative 11/30/2013 1010   KETONESUR 5 (A) 01/31/2023 1523   PROTEINUR 100 (A) 01/31/2023 1523   UROBILINOGEN 0.2 03/14/2007 1000   NITRITE NEGATIVE 01/31/2023 1523   LEUKOCYTESUR LARGE (A) 01/31/2023 1523   LEUKOCYTESUR Negative 11/30/2013 1010    Radiological Exams on Admission: CT PELVIS W CONTRAST Result Date: 11/17/2023 CLINICAL DATA:  Cellulitis. Rash underneath the belly forming a few days ago. Burning.  EXAM: CT PELVIS WITH CONTRAST TECHNIQUE: Multidetector CT imaging of the pelvis was performed using the standard protocol following the bolus administration of intravenous contrast. RADIATION DOSE REDUCTION: This exam was performed according to the departmental dose-optimization program which includes automated exposure control, adjustment of the mA and/or kV according to patient size and/or use of iterative reconstruction technique.  CONTRAST:  OMNIPAQUE  IOHEXOL  300 MG/ML  SOLN COMPARISON:  CT abdomen and pelvis 01/31/2023 FINDINGS: Urinary Tract:  No abnormality visualized. Bowel:  Unremarkable visualized pelvic bowel loops. Vascular/Lymphatic: No pathologically enlarged lymph nodes. No significant vascular abnormality seen. Reproductive:  No mass or other significant abnormality Other: Scarring in the anterior abdominal wall is likely postoperative. Minimal ventral abdominal wall hernia containing fat. No change. Circumscribed subcutaneous nodule in the anterior abdominal fat to the right of midline measuring 5 cm in maximal diameter. This is unchanged since prior study and may represent sebaceous cyst or old hematoma. Generator pack in the subcutaneous fat to the left of midline posteriorly. No significant soft tissue stranding, skin thickening, or soft tissue gas in the abdominal wall. No CT evidence of acute abscess or infiltration. Musculoskeletal: No suspicious bone lesions identified. IMPRESSION: 1. Chronic postoperative changes in the anterior abdominal wall with small ventral hernia containing fat. Focal nodule or collection to the right of midline in the anterior abdominal wall fat is unchanged since prior study, likely postoperative or sebaceous cyst. 2. No acute infiltration, skin thickening, collection, or gas to suggest cellulitis or soft tissue abscess. The Electronically Signed   By: Elsie Gravely M.D.   On: 11/17/2023 15:56    EKG: Independently reviewed.   Assessment/Plan  Nonketotic Hyperglycemic state -admit to progressive care  - continue on insulin  drip per protocol  -  q6 hours bmp  -titrate insulin  drip of as able  - continue with ivfs per protocol   Severe b/l groin Cellulitis/intertrigo  -possible fungal vs bacterial  - continue with vanc/zosyn  , diflucan  - hx of mrsa infection  -wound care to see  - supportive pain medication , trial of topical lidocaine  -continue with antifungal topical  as well  -monitor closely  -CT abd no sign of abscess of deep tissue infection  -continue to trend lactic and inflammatory markers   Psuedo hyponatremia due to hyperglycemia -continue to monitor labs  -continue with iv hydration    CKDIII -at baseline    HFpEF -appears compensated  -resume home regimen    PAF -continue metoprolol  -continue eliquis    COPD on chronic home O2  -continue on  3-4L baselineO2 -resume controller medications   Hypothyroidism -continue synthroid    Iron deficiency anemia -at baseline 1.3   Seropositive RA -no acute flare  -resume home regimen one infection treated   Bipolar -resume home regimen once med rec completed   Chronic pain syndrome -resume home regimen   DVT prophylaxis: Eliquis   Code Status: full/ as discussed per patient wishes in event of cardiac arrest  Family Communication: none at bedside Disposition Plan: patient  expected to be admitted greater than 2 midnights  Consults called: n/a Admission status: progressive care   Camila DELENA Ned MD [facility]   If 7PM-7AM, please contact night-coverage [email or web address] Password [facility]  11/17/2023, 5:23 PM

## 2023-11-18 ENCOUNTER — Other Ambulatory Visit (HOSPITAL_COMMUNITY): Payer: Self-pay

## 2023-11-18 ENCOUNTER — Encounter: Payer: Self-pay | Admitting: Internal Medicine

## 2023-11-18 ENCOUNTER — Telehealth (HOSPITAL_COMMUNITY): Payer: Self-pay | Admitting: Pharmacy Technician

## 2023-11-18 DIAGNOSIS — E1165 Type 2 diabetes mellitus with hyperglycemia: Secondary | ICD-10-CM | POA: Diagnosis not present

## 2023-11-18 LAB — BASIC METABOLIC PANEL WITH GFR
Anion gap: 10 (ref 5–15)
Anion gap: 10 (ref 5–15)
Anion gap: 9 (ref 5–15)
BUN: 16 mg/dL (ref 8–23)
BUN: 15 mg/dL (ref 8–23)
BUN: 16 mg/dL (ref 8–23)
CO2: 23 mmol/L (ref 22–32)
CO2: 21 mmol/L — ABNORMAL LOW (ref 22–32)
CO2: 24 mmol/L (ref 22–32)
Calcium: 8.5 mg/dL — ABNORMAL LOW (ref 8.9–10.3)
Calcium: 8.9 mg/dL (ref 8.9–10.3)
Calcium: 8.9 mg/dL (ref 8.9–10.3)
Chloride: 99 mmol/L (ref 98–111)
Chloride: 103 mmol/L (ref 98–111)
Chloride: 100 mmol/L (ref 98–111)
Creatinine, Ser: 1.25 mg/dL — ABNORMAL HIGH (ref 0.44–1.00)
Creatinine, Ser: 1.09 mg/dL — ABNORMAL HIGH (ref 0.44–1.00)
Creatinine, Ser: 1.15 mg/dL — ABNORMAL HIGH (ref 0.44–1.00)
GFR, Estimated: 49 mL/min — ABNORMAL LOW (ref >60)
GFR, Estimated: 57 mL/min — ABNORMAL LOW (ref >60)
GFR, Estimated: 54 mL/min — ABNORMAL LOW (ref >60)
Glucose, Bld: 208 mg/dL — ABNORMAL HIGH (ref 70–99)
Glucose, Bld: 174 mg/dL — ABNORMAL HIGH (ref 70–99)
Glucose, Bld: 206 mg/dL — ABNORMAL HIGH (ref 70–99)
Potassium: 3.3 mmol/L — ABNORMAL LOW (ref 3.5–5.1)
Potassium: 4.2 mmol/L (ref 3.5–5.1)
Potassium: 4.1 mmol/L (ref 3.5–5.1)
Sodium: 132 mmol/L — ABNORMAL LOW (ref 135–145)
Sodium: 134 mmol/L — ABNORMAL LOW (ref 135–145)
Sodium: 133 mmol/L — ABNORMAL LOW (ref 135–145)

## 2023-11-18 LAB — GLUCOSE, CAPILLARY
Glucose-Capillary: 146 mg/dL — ABNORMAL HIGH (ref 70–99)
Glucose-Capillary: 156 mg/dL — ABNORMAL HIGH (ref 70–99)
Glucose-Capillary: 156 mg/dL — ABNORMAL HIGH (ref 70–99)
Glucose-Capillary: 165 mg/dL — ABNORMAL HIGH (ref 70–99)
Glucose-Capillary: 174 mg/dL — ABNORMAL HIGH (ref 70–99)
Glucose-Capillary: 183 mg/dL — ABNORMAL HIGH (ref 70–99)
Glucose-Capillary: 190 mg/dL — ABNORMAL HIGH (ref 70–99)
Glucose-Capillary: 195 mg/dL — ABNORMAL HIGH (ref 70–99)
Glucose-Capillary: 200 mg/dL — ABNORMAL HIGH (ref 70–99)
Glucose-Capillary: 206 mg/dL — ABNORMAL HIGH (ref 70–99)
Glucose-Capillary: 208 mg/dL — ABNORMAL HIGH (ref 70–99)

## 2023-11-18 LAB — MRSA NEXT GEN BY PCR, NASAL: MRSA by PCR Next Gen: NOT DETECTED

## 2023-11-18 LAB — CBC
HCT: 31.2 % — ABNORMAL LOW (ref 36.0–46.0)
Hemoglobin: 9.1 g/dL — ABNORMAL LOW (ref 12.0–15.0)
MCH: 21.8 pg — ABNORMAL LOW (ref 26.0–34.0)
MCHC: 29.2 g/dL — ABNORMAL LOW (ref 30.0–36.0)
MCV: 74.8 fL — ABNORMAL LOW (ref 80.0–100.0)
Platelets: 265 K/uL (ref 150–400)
RBC: 4.17 MIL/uL (ref 3.87–5.11)
RDW: 20.9 % — ABNORMAL HIGH (ref 11.5–15.5)
WBC: 6.9 K/uL (ref 4.0–10.5)
nRBC: 0 % (ref 0.0–0.2)

## 2023-11-18 LAB — COMPREHENSIVE METABOLIC PANEL WITH GFR
ALT: 28 U/L (ref 0–44)
AST: 28 U/L (ref 15–41)
Albumin: 2.7 g/dL — ABNORMAL LOW (ref 3.5–5.0)
Alkaline Phosphatase: 81 U/L (ref 38–126)
Anion gap: 15 (ref 5–15)
BUN: 14 mg/dL (ref 8–23)
CO2: 23 mmol/L (ref 22–32)
Calcium: 8 mg/dL — ABNORMAL LOW (ref 8.9–10.3)
Chloride: 97 mmol/L — ABNORMAL LOW (ref 98–111)
Creatinine, Ser: 1.05 mg/dL — ABNORMAL HIGH (ref 0.44–1.00)
GFR, Estimated: >60 mL/min (ref >60)
Glucose, Bld: 166 mg/dL — ABNORMAL HIGH (ref 70–99)
Potassium: 3.6 mmol/L (ref 3.5–5.1)
Sodium: 135 mmol/L (ref 135–145)
Total Bilirubin: 0.4 mg/dL (ref 0.0–1.2)
Total Protein: 6.3 g/dL — ABNORMAL LOW (ref 6.5–8.1)

## 2023-11-18 LAB — HIV ANTIBODY (ROUTINE TESTING W REFLEX): HIV Screen 4th Generation wRfx: NONREACTIVE

## 2023-11-18 MED ORDER — ONDANSETRON HCL 4 MG/2ML IJ SOLN
INTRAMUSCULAR | Status: AC
Start: 2023-11-18 — End: 2023-11-18
  Filled 2023-11-18: qty 2

## 2023-11-18 MED ORDER — SODIUM CHLORIDE 0.9 % IV SOLN: 12.5000 mg | Freq: Four times a day (QID) | INTRAVENOUS | Status: DC | PRN

## 2023-11-18 MED ORDER — HYDROMORPHONE HCL 1 MG/ML IJ SOLN
0.5000 mg | INTRAMUSCULAR | Status: AC | PRN
  Administered 2023-11-18 (×2): 0.5 mg via INTRAVENOUS
  Filled 2023-11-18 (×2): qty 1

## 2023-11-18 MED ORDER — LEVOTHYROXINE SODIUM 88 MCG PO TABS
88.0000 ug | ORAL_TABLET | Freq: Every day | ORAL | Status: DC
  Administered 2023-11-19 – 2023-11-22 (×4): 88 ug via ORAL
  Filled 2023-11-18 (×4): qty 1

## 2023-11-18 MED ORDER — INSULIN ASPART 100 UNIT/ML IJ SOLN
0.0000 [IU] | Freq: Three times a day (TID) | INTRAMUSCULAR | Status: DC
  Administered 2023-11-18: 4 [IU] via SUBCUTANEOUS
  Administered 2023-11-18: 3 [IU] via SUBCUTANEOUS
  Administered 2023-11-18: 4 [IU] via SUBCUTANEOUS
  Administered 2023-11-19 (×3): 7 [IU] via SUBCUTANEOUS
  Administered 2023-11-20: 11 [IU] via SUBCUTANEOUS
  Administered 2023-11-20: 7 [IU] via SUBCUTANEOUS
  Administered 2023-11-20 – 2023-11-21 (×2): 11 [IU] via SUBCUTANEOUS
  Administered 2023-11-21 (×2): 7 [IU] via SUBCUTANEOUS
  Administered 2023-11-22: 4 [IU] via SUBCUTANEOUS
  Administered 2023-11-22: 7 [IU] via SUBCUTANEOUS
  Filled 2023-11-18 (×15): qty 1

## 2023-11-18 MED ORDER — PREGABALIN 50 MG PO CAPS
100.0000 mg | ORAL_CAPSULE | Freq: Three times a day (TID) | ORAL | Status: DC
  Administered 2023-11-18 – 2023-11-22 (×11): 100 mg via ORAL
  Filled 2023-11-18 (×11): qty 2

## 2023-11-18 MED ORDER — INSULIN GLARGINE-YFGN 100 UNIT/ML ~~LOC~~ SOLN
20.0000 [IU] | SUBCUTANEOUS | Status: DC
  Administered 2023-11-18 – 2023-11-20 (×3): 20 [IU] via SUBCUTANEOUS
  Filled 2023-11-18 (×3): qty 0.2

## 2023-11-18 MED ORDER — POTASSIUM CHLORIDE 10 MEQ/100ML IV SOLN
10.0000 meq | INTRAVENOUS | Status: AC
  Administered 2023-11-18 (×4): 10 meq via INTRAVENOUS
  Filled 2023-11-18 (×4): qty 100

## 2023-11-18 MED ORDER — TRAZODONE HCL 50 MG PO TABS
100.0000 mg | ORAL_TABLET | Freq: Every evening | ORAL | Status: DC | PRN
  Administered 2023-11-18 – 2023-11-19 (×2): 100 mg via ORAL
  Filled 2023-11-18 (×2): qty 2

## 2023-11-18 MED ORDER — ARIPIPRAZOLE 2 MG PO TABS
2.0000 mg | ORAL_TABLET | Freq: Every day | ORAL | Status: DC
  Administered 2023-11-18 – 2023-11-21 (×4): 2 mg via ORAL
  Filled 2023-11-18 (×4): qty 1

## 2023-11-18 MED ORDER — INSULIN ASPART 100 UNIT/ML IJ SOLN
0.0000 [IU] | Freq: Every day | INTRAMUSCULAR | Status: DC
  Administered 2023-11-19: 3 [IU] via SUBCUTANEOUS
  Filled 2023-11-18 (×2): qty 1

## 2023-11-18 MED ORDER — METOPROLOL TARTRATE 25 MG PO TABS
25.0000 mg | ORAL_TABLET | Freq: Two times a day (BID) | ORAL | Status: DC
Start: 2023-11-18 — End: 2023-11-22
  Administered 2023-11-18 – 2023-11-22 (×8): 25 mg via ORAL
  Filled 2023-11-18 (×8): qty 1

## 2023-11-18 MED ORDER — APIXABAN 5 MG PO TABS
5.0000 mg | ORAL_TABLET | Freq: Two times a day (BID) | ORAL | Status: DC
  Administered 2023-11-18 – 2023-11-22 (×8): 5 mg via ORAL
  Filled 2023-11-18 (×8): qty 1

## 2023-11-18 MED ORDER — ONDANSETRON HCL 4 MG/2ML IJ SOLN
4.0000 mg | Freq: Four times a day (QID) | INTRAMUSCULAR | Status: DC | PRN
  Administered 2023-11-18: 4 mg via INTRAVENOUS

## 2023-11-18 MED ORDER — QUETIAPINE FUMARATE 300 MG PO TABS
300.0000 mg | ORAL_TABLET | Freq: Every day | ORAL | Status: DC
  Administered 2023-11-18 – 2023-11-21 (×4): 300 mg via ORAL
  Filled 2023-11-18 (×4): qty 1

## 2023-11-18 MED ORDER — HYDROMORPHONE HCL 1 MG/ML IJ SOLN
0.5000 mg | INTRAMUSCULAR | Status: DC | PRN
  Administered 2023-11-18 – 2023-11-22 (×12): 0.5 mg via INTRAVENOUS
  Filled 2023-11-18 (×2): qty 0.5
  Filled 2023-11-18: qty 1
  Filled 2023-11-18 (×9): qty 0.5

## 2023-11-18 NOTE — Telephone Encounter (Signed)
 Pharmacy Patient Advocate Encounter   Received notification from Inpatient Request that prior authorization for FreeStyle Libre 3 Plus Sensor is required/requested.   Insurance verification completed.   The patient is insured through [employer] .   Per test claim: PA required; PA submitted to above mentioned insurance via CoverMyMeds Key/confirmation #/EOC [id number] Status is pending

## 2023-11-18 NOTE — Consult Note (Signed)
 WOC Nurse Consult Note: Reason for Consult: requested to assess intertrigo/ associated with cellulitis on groin, belly button and under breasts. Performed remotely evaluating photos and notes. Wound type: MASD intertriginous. Pressure Injury POA: NA Measurement: groin, pannus area and left under breast. Wound bed: 100% irritated skin, rash, redness. Drainage (amount, consistency, odor) Minimum amount, characteristic odor. Periwound: redness, intact, Dressing procedure/placement/frequency: Clean the areas with warm water, soup and wash cloths. Remove the powder as much as you can with soft movements. Order Gerlean # [id number] Measure and cut length of InterDry to fit in skin folds that have skin breakdown Tuck InterDry fabric into skin folds in a single layer, allow for 2 inches of overhang from skin edges to allow for wicking to occur May remove to bathe; dry area thoroughly and then tuck into affected areas again Do not apply any creams or ointments when using InterDry DO NOT THROW AWAY FOR 5 DAYS unless soiled with stool DO NOT [facility] product, this will inactivate the silver in the material  New sheet of Interdry should be applied after 5 days of use if patient continues to have skin breakdown    WOC team will not plan to follow further. Please reconsult if further assistance is needed. Thank-you,  Lela Holm BSN, CNS, RN, [employer], WOCN  (Phone [phone])

## 2023-11-18 NOTE — Telephone Encounter (Signed)
 Patient is scheduled to see Dr. Tamea on 9/10. We can requalify her then.  Nothing further needed.

## 2023-11-18 NOTE — Telephone Encounter (Signed)
 Patient Product/process development scientist completed.    The patient is insured through [facility] [place].     Ran test claim for [employer] and Requires Prior Authorization   This test claim was processed through [employer]- copay amounts may vary at other pharmacies due to [employer], or as the patient moves through the different stages of their insurance plan.     Reyes Sharps, CPHT Pharmacy Technician III Certified Patient Advocate [facility] Patient Advocate Team Direct Number: [phone]  Fax: [phone]

## 2023-11-18 NOTE — Progress Notes (Signed)
 Progress Note   Patient: [name] [name] FMW:[id number] DOB: June 08, 1960 DOA: 11/17/2023     1 DOS: the patient was seen and examined on 11/18/2023   Brief hospital course:  63 y.o. female with medical history significant of osteomyelitis of lower extremity (MRSA, morganella, and proteus isolated) s/p multiple podiatry surgeries/toe amputations, CKD, T2DM, HFpEF, pAF, COPD (3-4L baseline), hypothyroidism, iron deficiency anemia, seropositive RA, bipolar, chronic pain syndrome who presents to ED BIB EMS due to expanding rash in b/l groin x 2-3days.   Patient notes associated burning in area of rash. Patient notes no associated n/v/d/ dysuria/fever/chills with rash. ON further ros also denies chest pain sob / abdominal pain.   ED Course:  On evaluation in ED patient was found to have abdominal cellulitis as well as severe hyperglycemia w/o ketosis.  Patient was started on antibiotics as well as insulin  drip and slated for admission. Vitals: afeb bp 139/82, hr 85, rr 18, sat 96%    Assessment and Plan:  Nonketotic Hyperglycemic state -admit to progressive care  - Patient was on insulin  drip.  Switched to subcu insulin  - BMP has been normal will get BMP every 24 hours - continue with ivfs per protocol   Severe b/l groin Cellulitis/intertrigo  -possible fungal vs bacterial  - continue with vanc/zosyn  , diflucan  - hx of mrsa infection  -wound care to see  - supportive pain medication , trial of topical lidocaine  -continue with antifungal topical as well  -monitor closely  -CT abd no sign of abscess of deep tissue infection  -continue to trend lactic and inflammatory markers    Psuedo hyponatremia due to hyperglycemia -continue to monitor labs  -continue with iv hydration     CKDIII -at baseline     HFpEF -appears compensated  -resume home regimen     PAF -continue metoprolol  -continue eliquis     COPD on chronic home O2  -continue on  3-4L baselineO2 -resume controller  medications    Hypothyroidism -continue synthroid     Iron deficiency anemia -at baseline 1.3    Seropositive RA -no acute flare  -resume home regimen one infection treated    Bipolar -resume home regimen once med rec completed    Chronic pain syndrome -resume home regimen    DVT prophylaxis: Eliquis   Code Status: full/ as discussed per patient wishes in event of cardiac arrest      Subjective: Patient seen in morning rounds.  Complaining of abdominal pain which is chronic but more pronounced.  Otherwise no overnight events.  Insulin  drip was discontinued patient was transition to subcu insulin .  Physical Exam: Vitals:   11/18/23 0400 11/18/23 0600 11/18/23 0800 11/18/23 1200  BP: 130/61  (!) 133/104   Pulse: 71 86 76   Resp: 17 18 19    Temp: 98.6 F (37 C)  97.9 F (36.6 C) 98.2 F (36.8 C)  TempSrc: Oral  Oral Oral  SpO2: 99% 96% 99%   Weight:      Height:       Physical Exam Constitutional:      Appearance: She is obese. She is ill-appearing.  HENT:     Mouth/Throat:     Mouth: Mucous membranes are dry.  Eyes:     Extraocular Movements: Extraocular movements intact.     Pupils: Pupils are equal, round, and reactive to light.  Cardiovascular:     Rate and Rhythm: Normal rate.  Pulmonary:     Effort: Pulmonary effort is normal.  Abdominal:  General: There is distension.     Palpations: There is no mass.     Tenderness: There is abdominal tenderness.     Hernia: A hernia is present.  Musculoskeletal:        General: Normal range of motion.     Cervical back: Normal range of motion.  Skin:    General: Skin is warm.  Neurological:     General: No focal deficit present.     Mental Status: She is alert and oriented to person, place, and time. Mental status is at baseline.     Data Reviewed:  There are no new results to review at this time.  Family Communication: None by bedside  Disposition: Status is: Inpatient Remains inpatient appropriate  because: Medically not ready  Planned Discharge Destination: Home    Time spent: 35 minutes  Author: Albina Sor, MD 11/18/2023 2:26 PM  For on call review [email or web address].

## 2023-11-18 NOTE — Inpatient Diabetes Management (Addendum)
 Inpatient Diabetes Program Recommendations  AACE/ADA: New Consensus Statement on Inpatient Glycemic Control (2015)  Target Ranges:  Prepandial:   less than 140 mg/dL      Peak postprandial:   less than 180 mg/dL (1-2 hours)      Critically ill patients:  140 - 180 mg/dL   Lab Results  Component Value Date   GLUCAP 174 (H) 11/18/2023   HGBA1C 8.1 (H) 02/03/2023    Review of Glycemic Control  Diabetes history:  DM 2 Outpatient Diabetes medications: Tradjenta  5 mg Daily, Jardiance 10 mg Daily, Lantus  20 units Daily, Humalog  20 units tid (per pt report takes insulin  at home both short and long acting insulin ) Current orders for Inpatient glycemic control:  Semglee  20 units Q24 hours Novolog  0-20 units tid + hs  Spoke with pt at bedside regarding glucose levels at home. Pt reports eating once a day in the evening. She does not consistently take her insulin  due to not eating consistently throughout the day. She also reports not taking her basal insulin  consistently. I encouraged patient to eat consistently to be able to better dose her insulin . Pt reports going to an Endocrinologist but also says she does not follow up like she needs to. Pt was eating a slice of pizza and french fries at the time of our conversation (meal ticket consisting of 60-65 grams of carbohydrate). Encouraged pt to follow up.   Pt has been on CGM in the past. Working with pharmacy for pt to get CGM at time of discharge.  Saw pt again later this afternoon. Working on prior authorization for [employer] 3+ sensor. Gave pt 2 [employer] 3 sensors at bedside with instructions on how to download app. Pt has used dexcom prior and is familiar with the technology. Phone is an older android and should be compatible with the technology.  Thanks,  Clotilda Bull RN, MSN, BC-ADM Inpatient Diabetes Coordinator Team Pager [phone] (8a-5p)

## 2023-11-18 NOTE — TOC Initial Note (Signed)
 Transition of Care [facility]) - Initial/Assessment Note    Patient Details  Name: [name] [name] MRN: [id number] Date of Birth: 1960/05/30  Transition of Care [facility]) CM/SW Contact:    [name] JINNY Ruts, LCSW Phone Number: 11/18/2023, 12:41 PM  Clinical Narrative:                 Chart Reviewed. The patient was admitted for Severe Hyperglycemia due to diabetes mellitus. I spoke with the patient at bedside today. I introduced myself, my role, and reason for consult. The patient confirms that her primary care doctor is [name] [name] with [facility].   The patient reports that she lives with her daughter, [name] [name]. The patient reports that before admission she needed assistance with daily living activities and her daughter would help her. The patient confirms that her mother, [name] [name] helps with transporting her to all medical appointments and [name] [name] will be helping during discharge.   [name] [name] [facility] [address] [facility] [place] as her [facility] and her copays are affordable. The patient reports that she has had HH in the past with [facility] and was in a SNF located in [place]. The patient reports that she has not had HH services in 3 years. The patient reports that she has a cane, 4 wheel walker, and a 2 wheel walker in the home. The patient stated that she has no concerns in the home at this time.   TOC will follow the patient until discharge.         Patient Goals and CMS Choice            Expected Discharge Plan and Services       Living arrangements for the past 2 months: Single Family Home                                      Prior Living Arrangements/Services Living arrangements for the past 2 months: Single Family Home Lives with:: Adult Children Patient language and need for interpreter reviewed:: Yes        Need for Family Participation in Patient Care: Yes (Comment)   Current home services: DME Criminal Activity/Legal Involvement Pertinent to Current  Situation/Hospitalization: No - Comment as needed  Activities of Daily Living   ADL Screening (condition at time of admission) Independently performs ADLs?: No Does the patient have a NEW difficulty with bathing/dressing/toileting/self-feeding that is expected to last >3 days?: Yes (Initiates electronic notice to provider for possible OT consult) Does the patient have a NEW difficulty with getting in/out of bed, walking, or climbing stairs that is expected to last >3 days?: No Does the patient have a NEW difficulty with communication that is expected to last >3 days?: No Is the patient deaf or have difficulty hearing?: No Does the patient have difficulty seeing, even when wearing glasses/contacts?: No Does the patient have difficulty concentrating, remembering, or making decisions?: Yes  Permission Sought/Granted                  Emotional Assessment Appearance:: Appears stated age Attitude/Demeanor/Rapport: Engaged, Gracious Affect (typically observed): Accepting, Appropriate, Calm, Pleasant Orientation: : Oriented to Self, Oriented to Place, Oriented to  Time, Oriented to Situation Alcohol / Substance Use: Not Applicable Psych Involvement: No (comment)  Admission diagnosis:  Hyperglycemia [R73.9] Cellulitis, unspecified cellulitis site [L03.90] Severe hyperglycemia due to diabetes mellitus (HCC) [E11.65] Patient Active Problem List  Diagnosis Date Noted   Severe hyperglycemia due to diabetes mellitus (HCC) 11/17/2023   Acute gastroenteritis 03/03/2023   Essential hypertension 03/03/2023   Dyslipidemia 03/03/2023   Paroxysmal atrial fibrillation (HCC) 03/03/2023   AKI (acute kidney injury) (HCC) 03/02/2023   Diabetic infection of left foot (HCC) 02/02/2023   Community acquired pneumonia 02/02/2023   Acute respiratory failure with hypoxia (HCC) 02/01/2023   Multifocal pneumonia 02/01/2023   Acute on chronic diastolic CHF (congestive heart failure) (HCC) 02/01/2023    Acute UTI 01/31/2023   MRSA infection 01/13/2023   Gastroparesis 02/28/2022   Severe sepsis (HCC) 01/04/2022   Depression with anxiety    PAF (paroxysmal atrial fibrillation) (HCC)    Pulmonary fibrosis (HCC) 02/07/2020   Chronic respiratory failure with hypoxia (HCC) 02/07/2020   Anemia 02/07/2020   Morbid obesity with BMI of 50.0-59.9, adult (HCC) 07/08/2019   Controlled type 2 diabetes mellitus without complication, without long-term current use of insulin  (HCC) 04/06/2019   CKD stage 3b, GFR 30-44 ml/min (HCC) 04/06/2019   HLD (hyperlipidemia) 04/06/2019   Abdominal pain 02/12/2019   Diabetic ulcer of left foot (HCC) 02/14/2018   Chronic pain syndrome 03/31/2016   Rheumatoid arthritis (HCC) 12/25/2015   Osteoarthritis, multiple sites 12/24/2015   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[employer]) 12/23/2015   Long term prescription opiate use 12/23/2015   Diabetic peripheral neuropathy (HCC) 12/23/2015   COPD (chronic obstructive pulmonary disease) (HCC) 01/03/2014   Bipolar disorder, unspecified (HCC) 01/03/2014   Borderline personality disorder (HCC) 01/06/2012   Anxiety and depression 01/06/2012   Nausea & vomiting 08/04/2011   Hypothyroidism 06/28/2010   Rheumatoid arthritis involving multiple sites with positive rheumatoid factor (HCC) 06/28/2010   Essential (primary) hypertension 01/27/2005   Major depressive disorder, recurrent episode, moderate (HCC) 06/03/2004   PCP:  [name] [name] ORN, NP Pharmacy:   [phone] [place], [place] - [name] [place] [address] Phone: [phone] Fax: [phone]  [facility] - [place], [place] - [address] [place] [address] Phone: [phone] Fax: [phone]     Social Drivers of Health (SDOH) Social History: SDOH Screenings   Food Insecurity: No Food Insecurity (11/17/2023)  Housing: Low Risk  (11/17/2023)  Transportation Needs: No  Transportation Needs (11/17/2023)  Utilities: Not At Risk (11/17/2023)  Depression (PHQ2-9): Low Risk  (10/06/2023)  Financial Resource Strain: Medium Risk (06/02/2022)   Received from [facility]  Stress: Stress Concern Present (04/28/2022)  Tobacco Use: Medium Risk (11/14/2023)   SDOH Interventions:     Readmission Risk Interventions    11/18/2023   12:35 PM 03/04/2023   11:07 AM 01/05/2022   11:09 AM  Readmission Risk Prevention Plan  Transportation Screening Complete Complete Complete  PCP or Specialist Appt within 3-5 Days Complete  Complete  Social Work Consult for Recovery Care Planning/Counseling Complete  Complete  Palliative Care Screening Not Applicable  Not Applicable  Medication Review Oceanographer) Complete Complete Complete  PCP or Specialist appointment within 3-5 days of discharge  Complete   SW Recovery Care/Counseling Consult  Complete   Palliative Care Screening  Not Applicable   Skilled Nursing Facility  Not Applicable

## 2023-11-19 DIAGNOSIS — E1165 Type 2 diabetes mellitus with hyperglycemia: Secondary | ICD-10-CM | POA: Diagnosis not present

## 2023-11-19 LAB — BASIC METABOLIC PANEL WITH GFR
Anion gap: 10 (ref 5–15)
Anion gap: 9 (ref 5–15)
Anion gap: 13 (ref 5–15)
BUN: 16 mg/dL (ref 8–23)
BUN: 17 mg/dL (ref 8–23)
BUN: 22 mg/dL (ref 8–23)
CO2: 24 mmol/L (ref 22–32)
CO2: 24 mmol/L (ref 22–32)
CO2: 19 mmol/L — ABNORMAL LOW (ref 22–32)
Calcium: 8.9 mg/dL (ref 8.9–10.3)
Calcium: 8.8 mg/dL — ABNORMAL LOW (ref 8.9–10.3)
Calcium: 9.1 mg/dL (ref 8.9–10.3)
Chloride: 100 mmol/L (ref 98–111)
Chloride: 101 mmol/L (ref 98–111)
Chloride: 101 mmol/L (ref 98–111)
Creatinine, Ser: 1.22 mg/dL — ABNORMAL HIGH (ref 0.44–1.00)
Creatinine, Ser: 1.13 mg/dL — ABNORMAL HIGH (ref 0.44–1.00)
Creatinine, Ser: 1.47 mg/dL — ABNORMAL HIGH (ref 0.44–1.00)
GFR, Estimated: 50 mL/min — ABNORMAL LOW (ref >60)
GFR, Estimated: 55 mL/min — ABNORMAL LOW (ref >60)
GFR, Estimated: 40 mL/min — ABNORMAL LOW (ref >60)
Glucose, Bld: 240 mg/dL — ABNORMAL HIGH (ref 70–99)
Glucose, Bld: 261 mg/dL — ABNORMAL HIGH (ref 70–99)
Glucose, Bld: 238 mg/dL — ABNORMAL HIGH (ref 70–99)
Potassium: 4.2 mmol/L (ref 3.5–5.1)
Potassium: 4.2 mmol/L (ref 3.5–5.1)
Potassium: 4.6 mmol/L (ref 3.5–5.1)
Sodium: 134 mmol/L — ABNORMAL LOW (ref 135–145)
Sodium: 134 mmol/L — ABNORMAL LOW (ref 135–145)
Sodium: 133 mmol/L — ABNORMAL LOW (ref 135–145)

## 2023-11-19 LAB — GLUCOSE, CAPILLARY
Glucose-Capillary: 229 mg/dL — ABNORMAL HIGH (ref 70–99)
Glucose-Capillary: 227 mg/dL — ABNORMAL HIGH (ref 70–99)
Glucose-Capillary: 227 mg/dL — ABNORMAL HIGH (ref 70–99)
Glucose-Capillary: 252 mg/dL — ABNORMAL HIGH (ref 70–99)

## 2023-11-19 LAB — IRON AND TIBC
Iron: 12 ug/dL — ABNORMAL LOW (ref 28–170)
Saturation Ratios: 4 % — ABNORMAL LOW (ref 10.4–31.8)
TIBC: 302 ug/dL (ref 250–450)
UIBC: 290 ug/dL

## 2023-11-19 LAB — VITAMIN B12: Vitamin B-12: 287 pg/mL (ref 180–914)

## 2023-11-19 MED ORDER — IRON SUCROSE 300 MG IVPB - SIMPLE MED
300.0000 mg | Freq: Once | Status: AC
  Administered 2023-11-19: 300 mg via INTRAVENOUS
  Filled 2023-11-19: qty 300

## 2023-11-19 MED ORDER — VITAMIN C 500 MG PO TABS
500.0000 mg | ORAL_TABLET | Freq: Every day | ORAL | Status: DC
  Administered 2023-11-20 – 2023-11-22 (×3): 500 mg via ORAL
  Filled 2023-11-19 (×3): qty 1

## 2023-11-19 MED ORDER — CLOTRIMAZOLE 1 % EX CREA
TOPICAL_CREAM | Freq: Two times a day (BID) | CUTANEOUS | Status: DC
  Filled 2023-11-19: qty 15

## 2023-11-19 MED ORDER — POLYSACCHARIDE IRON COMPLEX 150 MG PO CAPS
150.0000 mg | ORAL_CAPSULE | Freq: Every day | ORAL | Status: DC
  Administered 2023-11-20 – 2023-11-22 (×3): 150 mg via ORAL
  Filled 2023-11-19 (×3): qty 1

## 2023-11-19 MED ORDER — SODIUM BICARBONATE 650 MG PO TABS
650.0000 mg | ORAL_TABLET | Freq: Three times a day (TID) | ORAL | Status: AC
  Administered 2023-11-19 (×2): 650 mg via ORAL
  Filled 2023-11-19 (×2): qty 1

## 2023-11-19 MED ORDER — AMOXICILLIN-POT CLAVULANATE 875-125 MG PO TABS
1.0000 | ORAL_TABLET | Freq: Two times a day (BID) | ORAL | Status: DC
  Administered 2023-11-19 – 2023-11-22 (×7): 1 via ORAL
  Filled 2023-11-19 (×7): qty 1

## 2023-11-19 NOTE — Progress Notes (Signed)
 Physical Therapy Evaluation Patient Details Name: [name] [name] MRN: [id number] DOB: 1960-08-19 Today's Date: 11/19/2023  History of Present Illness  Pt is a 63 y/o female admitted secondary to a worsening rash and hyperglycemia. Pt found to have severe bilateral groin cellulitis/intertrigo. PMH including but not limited to osteomyelitis of lower extremity (MRSA, morganella, and proteus isolated) s/p multiple podiatry surgeries/toe amputations, CKD, T2DM, HFpEF, pAF, COPD (3-4L baseline), hypothyroidism, iron  deficiency anemia, seropositive RA, bipolar, chronic pain syndrome.   Clinical Impression  Pt presented supine in bed with HOB elevated, awake and willing to participate in therapy session. Prior to admission, pt reported that she ambulated with use of a rollator as needed for pain. She also stated that she required some assistance from daughter with LB bathing. Pt lives with her daughter in a single level home with one step to enter. At the time of evaluation, pt able to complete bed mobility, transfers and ambulation with supervision and use of RW. Pt limited secondary to fatigue. Pt would continue to benefit from skilled physical therapy services at this time while admitted and after d/c to address the below listed limitations in order to improve overall safety and independence with functional mobility.         If plan is discharge home, recommend the following: A little help with bathing/dressing/bathroom;Assistance with cooking/housework;Help with stairs or ramp for entrance   Can travel by private vehicle        Equipment Recommendations None recommended by PT  Recommendations for Other Services       Functional Status Assessment Patient has had a recent decline in their functional status and demonstrates the ability to make significant improvements in function in a reasonable and predictable amount of time.     Precautions / Restrictions Precautions Precautions: Fall Recall  of Precautions/Restrictions: Intact Restrictions Weight Bearing Restrictions Per Provider Order: No      Mobility  Bed Mobility Overal bed mobility: Needs Assistance Bed Mobility: Supine to Sit, Sit to Supine     Supine to sit: Supervision Sit to supine: Supervision   General bed mobility comments: for safety    Transfers Overall transfer level: Needs assistance Equipment used: Rolling walker (2 wheels) Transfers: Sit to/from Stand Sit to Stand: Supervision           General transfer comment: steady with transitional movement    Ambulation/Gait Ambulation/Gait assistance: Supervision Gait Distance (Feet): 100 Feet Assistive device: Rolling walker (2 wheels) Gait Pattern/deviations: Step-through pattern, Decreased stride length Gait velocity: decreased     General Gait Details: pt steady overall with use of RW, but fatiguing quickly and requiring several standing rest breaks on return  Stairs            Wheelchair Mobility     Tilt Bed    Modified Rankin (Stroke Patients Only)       Balance Overall balance assessment: Needs assistance Sitting-balance support: Feet supported Sitting balance-Leahy Scale: Good     Standing balance support: During functional activity, No upper extremity supported Standing balance-Leahy Scale: Fair                               Pertinent Vitals/Pain Pain Assessment Pain Assessment: Faces Faces Pain Scale: Hurts little more Pain Location: R lower abdomen Pain Descriptors / Indicators: Sore Pain Intervention(s): Monitored during session, Repositioned    Home Living Family/patient expects to be discharged to:: Private residence Living Arrangements: Children Available Help  at Discharge: Family;Available PRN/intermittently Type of Home: House Home Access: Stairs to enter   Entrance Stairs-Number of Steps: 1   Home Layout: One level Home Equipment: Agricultural consultant (2 wheels);Rollator (4  wheels);BSC/3in1;Wheelchair - manual;Wheelchair - power      Prior Function Prior Level of Function : Independent/Modified Independent;Driving             Mobility Comments: pt ambulates with rollator or RW as needed secondary to pain ADLs Comments: pt reporting that she requires intermittent assistance with LB bathing from daughter     Extremity/Trunk Assessment   Upper Extremity Assessment Upper Extremity Assessment: Overall WFL for tasks assessed    Lower Extremity Assessment Lower Extremity Assessment: Overall WFL for tasks assessed       Communication   Communication Communication: No apparent difficulties    Cognition Arousal: Alert Behavior During Therapy: WFL for tasks assessed/performed   PT - Cognitive impairments: No apparent impairments                         Following commands: Intact       Cueing Cueing Techniques: Verbal cues     General Comments      Exercises     Assessment/Plan    PT Assessment Patient needs continued PT services  PT Problem List Decreased strength;Decreased range of motion;Decreased activity tolerance;Decreased balance;Decreased mobility;Decreased coordination;Decreased knowledge of use of DME;Decreased safety awareness;Decreased knowledge of precautions       PT Treatment Interventions DME instruction;Gait training;Stair training;Functional mobility training;Therapeutic activities;Therapeutic exercise;Balance training;Neuromuscular re-education;Patient/family education    PT Goals (Current goals can be found in the Care Plan section)  Acute Rehab PT Goals Patient Stated Goal: decrease pain PT Goal Formulation: With patient Time For Goal Achievement: 12/03/23 Potential to Achieve Goals: Good    Frequency Min 2X/week     Co-evaluation               AM-PAC PT 6 Clicks Mobility  Outcome Measure Help needed turning from your back to your side while in a flat bed without using bedrails?:  None Help needed moving from lying on your back to sitting on the side of a flat bed without using bedrails?: None Help needed moving to and from a bed to a chair (including a wheelchair)?: A Little Help needed standing up from a chair using your arms (e.g., wheelchair or bedside chair)?: None Help needed to walk in hospital room?: A Little Help needed climbing 3-5 steps with a railing? : A Little 6 Click Score: 21    End of Session   Activity Tolerance: Patient tolerated treatment well Patient left: in bed;with call bell/phone within reach;with bed alarm set;Other (comment) (NT in room) Nurse Communication: Mobility status PT Visit Diagnosis: Other abnormalities of gait and mobility (R26.89)    Time: 8868-8798 PT Time Calculation (min) (ACUTE ONLY): 30 min   Charges:   PT Evaluation $PT Eval Moderate Complexity: 1 Mod PT Treatments $Gait Training: 8-22 mins PT General Charges $$ ACUTE PT VISIT: 1 Visit         Delon DELENA KLEIN, DPT  Acute Rehabilitation Services Office [phone]   Delon HERO Joab Carden 11/19/2023, 12:52 PM

## 2023-11-19 NOTE — Evaluation (Signed)
 Occupational Therapy Evaluation Patient Details Name: [name] [name] MRN: [id number] DOB: 27-May-1960 Today's Date: 11/19/2023   History of Present Illness   Pt is a 63 y/o female admitted secondary to a worsening rash and hyperglycemia. Pt found to have severe bilateral groin cellulitis/intertrigo. PMH including but not limited to osteomyelitis of lower extremity (MRSA, morganella, and proteus isolated) s/p multiple podiatry surgeries/toe amputations, CKD, T2DM, HFpEF, pAF, COPD (3-4L baseline), hypothyroidism, iron  deficiency anemia, seropositive RA, bipolar, chronic pain syndrome.     Clinical Impressions Pt. Presents with 10/10 right lower abdomen pain, lethargy, weakness, limited activity tolerance, and limited functional mobility which hinders her ability to complete basic ADL and IADL functioning. Pt. Resides at home with her daughter. Pt.  Required assist for LE ADLs from her daughter, as well as home management tasks, and meal preparation tasks. Pt. Is limited by pain, and intermittent lethargy this afternoon. Pt. Requires minA UE dressing, and ModA LE  dressing 2/2 limited reach to LEs. [email or web address] was provided about A/E use for LE ADLs.  Pt. Will  benefit from OT services for ADL training, A/E training, UE there. Ex. and Pt./caregiver education about home modification, and DME.        If plan is discharge home, recommend the following:   A little help with walking and/or transfers;A lot of help with bathing/dressing/bathroom;Assistance with cooking/housework     Functional Status Assessment   Patient has had a recent decline in their functional status and demonstrates the ability to make significant improvements in function in a reasonable and predictable amount of time.     Equipment Recommendations   None recommended by OT     Recommendations for Other Services         Precautions/Restrictions         Mobility Bed Mobility Overal bed mobility: Needs  Assistance Bed Mobility: Supine to Sit, Sit to Supine     Supine to sit: Supervision          Transfers Overall transfer level: Needs assistance Equipment used: Rolling walker (2 wheels) Transfers: Sit to/from Stand Sit to Stand: Supervision           General transfer comment: Mobility Per Pt. report      Balance                                           ADL either performed or assessed with clinical judgement   ADL Overall ADL's : Needs assistance/impaired Eating/Feeding: Set up   Grooming: Contact guard assist           Upper Body Dressing : Minimal assistance   Lower Body Dressing: Moderate assistance                       Vision Patient Visual Report: No change from baseline       Perception         Praxis         Pertinent Vitals/Pain Pain Assessment Pain Assessment: 0-10 Pain Score: 10-Worst pain ever Pain Location: Right lower abdomen Pain Descriptors / Indicators: Sore Pain Intervention(s): Monitored during session, Limited activity within patient's tolerance, Repositioned     Extremity/Trunk Assessment Upper Extremity Assessment Upper Extremity Assessment: Overall WFL for tasks assessed (Pt. reports a history of left shoulder arthritis.)           Communication Communication Communication: No  apparent difficulties   Cognition Arousal: Lethargic Behavior During Therapy: WFL for tasks assessed/performed                                 Following commands: Intact       Cueing  General Comments   Cueing Techniques: Verbal cues      Exercises     Shoulder Instructions      Home Living Family/patient expects to be discharged to:: Private residence Living Arrangements: Children Available Help at Discharge: Family;Available PRN/intermittently Type of Home: House Home Access: Stairs to enter [employer] of Steps: 1   Home Layout: One level     Bathroom Shower/Tub:  Producer, television/film/video: Handicapped height     Home Equipment: Agricultural consultant (2 wheels);Rollator (4 wheels);BSC/3in1;Wheelchair - manual;Wheelchair - power          Prior Functioning/Environment Prior Level of Function : Independent/Modified Independent;Driving             Mobility Comments: Pt ambulates with rollator or RW as needed secondary to pain ADLs Comments: Pt. reports requiring assist with LE ADL tasks. Daugher assist with Home management tasks, and meal preparation.    OT Problem List: Decreased strength;Decreased range of motion;Decreased safety awareness;Decreased knowledge of use of DME or AE   OT Treatment/Interventions: Self-care/ADL training;Therapeutic exercise;Neuromuscular education;DME and/or AE instruction;Energy conservation;Patient/family education;Therapeutic activities      OT Goals(Current goals can be found in the care plan section)   Acute Rehab OT Goals Patient Stated Goal: Toi return home OT Goal Formulation: With patient Time For Goal Achievement: 12/03/23 Potential to Achieve Goals: Good   OT Frequency:  Min 1X/week    Co-evaluation              AM-PAC OT 6 Clicks Daily Activity     Outcome Measure Help from another person eating meals?: None Help from another person taking care of personal grooming?: A Little Help from another person toileting, which includes using toliet, bedpan, or urinal?: A Little Help from another person bathing (including washing, rinsing, drying)?: A Lot Help from another person to put on and taking off regular upper body clothing?: A Little Help from another person to put on and taking off regular lower body clothing?: A Lot 6 Click Score: 17   End of Session    Activity Tolerance: Patient limited by lethargy;Patient limited by pain Patient left: in bed                   Time: 8445-8381 OT Time Calculation (min): 24 min Charges:  OT General Charges $OT Visit: 1 Visit OT  Evaluation $OT Eval Low Complexity: 1 Low  Richardson Otter, MS, OTR/L   Richardson Otter 11/19/2023, 4:33 PM

## 2023-11-19 NOTE — Progress Notes (Signed)
 [facility] Progress Note  Patient: [name] [name]    FMW:[id number]  DOA: 11/17/2023     Date of Service: the patient was seen and examined on 11/19/2023  Chief Complaint  Patient presents with   Rash   Brief hospital course: 63 y.o. female with medical history significant of osteomyelitis of lower extremity (MRSA, morganella, and proteus isolated) s/p multiple podiatry surgeries/toe amputations, CKD, T2DM, HFpEF, pAF, COPD (3-4L baseline), hypothyroidism, iron  deficiency anemia, seropositive RA, bipolar, chronic pain syndrome who presents to ED BIB EMS due to expanding rash in b/l groin x 2-3days.   Patient notes associated burning in area of rash. Patient notes no associated n/v/d/ dysuria/fever/chills with rash. ON further ros also denies chest pain sob / abdominal pain.   ED Course:  On evaluation in ED patient was found to have abdominal cellulitis as well as severe hyperglycemia w/o ketosis.  Patient was started on antibiotics as well as insulin  drip and slated for admission. Vitals: afeb bp 139/82, hr 85, rr 18, sat 96%   Assessment and Plan:  Nonketotic Hyperglycemic state S/p Insulin  IV infusion, Switched to subcu insulin  S/p IV fluid Monitor CBG, continue diabetic diet Monitor BMP daily   Severe b/l groin Cellulitis/intertrigo  Fungal infection with possible bacterial superinfection due to excoriation S/p vanc/zosyn  and diflucan , DC'd on 8/2. - hx of mrsa infection   Patient refused IV antibiotic and Diflucan .  Agreed for oral antibiotics Augmentin , no allergies 8/2 started Augmentin  twice daily 8/2 started clotrimazole  1% topical twice daily followed by nystatin  twice daily, clean area with soap water before each application -wound care to see  - supportive pain medication -monitor closely  -CT abd no sign of abscess of deep tissue infection     Psuedo hyponatremia due to hyperglycemia -continue to monitor labs  -continue with iv hydration     CKDIII -at  baseline  8/2 metabolic acidosis, started bicarb 650 mg p.o. TID x 2 doses    HFpEF -appears compensated  -resume home regimen     PAF -continue metoprolol  -continue Eliquis     COPD on chronic home O2  -continue on  3-4L baseline O2 -resume controller medications    Hypothyroidism -continue synthroid     Iron  deficiency anemia Tsat 4% 8/2 Venofer  300 mg IV one-time dose given Start oral iron  supplement and vitamin C  on discharge   Seropositive RA -no acute flare  -resume home regimen one infection treated    Bipolar -resume home regimen once med rec completed    Chronic pain syndrome -resume home regimen    Body mass index is 55.68 kg/m.  Interventions:  Diet: Carb modified diet DVT Prophylaxis: Eliquis   Advance goals of care discussion: Full code  Family Communication: family was not present at bedside, at the time of interview.  The pt provided permission to discuss medical plan with the family. Opportunity was given to ask question and all questions were answered satisfactorily.   Disposition:  Pt is from home, admitted with severe hyperglycemia and cellulitis, still has significant cellulitis, which precludes a safe discharge. Discharge to home, when stable, may need few days to improve.  Subjective: No significant events overnight, patient refused to take IV antibiotics, agreed to take oral Augmentin .  Denied any allergy to Augmentin .  Agreed for the antifungal cream and nystatin .  Advised cleaning twice a day. I also explained RN regarding cleaning the area twice a day  Physical Exam: General: NAD, lying comfortably Appear in no distress, affect appropriate Eyes: PERRLA ENT:  Oral Mucosa Clear, moist  Neck: no JVD,  Cardiovascular: S1 and S2 Present, no Murmur,  Respiratory: good respiratory effort, Bilateral Air entry equal and Decreased, no Crackles, no wheezes Abdomen: Bowel Sound present, Soft and no tenderness,  Skin: Erythema and excoriation  around groin area and pannus  Extremities: no Pedal edema, no calf tenderness. 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  Neurologic: without any new focal findings Gait not checked due to patient safety concerns  Vitals:   11/19/23 0400 11/19/23 0732 11/19/23 1202 11/19/23 1304  BP: 125/81 118/70 (!) 97/51 95/70  Pulse: 63 67 64 68  Resp:  18 17   Temp: 97.7 F (36.5 C) (!) 97.5 F (36.4 C) 98 F (36.7 C)   TempSrc: Oral     SpO2: 97% 97% 92%   Weight:      Height:        Intake/Output Summary (Last 24 hours) at 11/19/2023 1449 Last data filed at 11/19/2023 1300 Gross per 24 hour  Intake 363.93 ml  Output 400 ml  Net -36.07 ml   Filed Weights   11/17/23 1325  Weight: (!) 156.5 kg    Data Reviewed: I have personally reviewed and interpreted daily labs, tele strips, imagings as discussed above. I reviewed all nursing notes, pharmacy notes, vitals, pertinent old records I have discussed plan of care as described above with RN and patient/family.  CBC: Recent Labs  Lab 11/17/23 1326 11/18/23 0837  WBC 8.6 6.9  NEUTROABS 7.1  --   HGB 10.2* 9.1*  HCT 34.2* 31.2*  MCV 75.0* 74.8*  PLT 292 265   Basic Metabolic Panel: Recent Labs  Lab 11/18/23 0837 11/18/23 1401 11/18/23 1940 11/19/23 0044 11/19/23 0456  NA 135 134* 133* 134* 134*  K 3.6 4.2 4.1 4.2 4.2  CL 97* 103 100 100 101  CO2 23 21* 24 24 24   GLUCOSE 166* 174* 206* 240* 261*  BUN 14 15 16 16 17   CREATININE 1.05* 1.09* 1.15* 1.22* 1.13*  CALCIUM  8.0* 8.9 8.9 8.9 8.8*    Studies: No results found.  Scheduled Meds:  amoxicillin -clavulanate  1 tablet Oral Q12H   apixaban   5 mg Oral BID   ARIPiprazole   2 mg Oral QHS   clotrimazole    Topical BID   insulin  aspart  0-20 Units Subcutaneous TID WC   insulin  aspart  0-5 Units Subcutaneous QHS   insulin  glargine-yfgn  20 Units Subcutaneous Q24H   levothyroxine   88 mcg Oral Q0600   lidocaine   1 Application Topical Once    metoprolol  tartrate  25 mg Oral BID   pregabalin   100 mg Oral TID   QUEtiapine   300 mg Oral QHS   Continuous Infusions:  promethazine  (PHENERGAN ) injection (IM or IVPB)     PRN Meds: albuterol , dextrose , HYDROmorphone  (DILAUDID ) injection, ondansetron  (ZOFRAN ) IV, promethazine  (PHENERGAN ) injection (IM or IVPB), traMADol , traZODone   Time spent: 55 minutes  Author: ELVAN SOR. MD [facility] 11/19/2023 2:49 PM  To reach On-call, see care teams to locate the attending and reach out to them via [email or web address]. If 7PM-7AM, please contact night-coverage If you still have difficulty reaching the attending provider, please page the [facility] (Director on Call) for [facility] on amion for assistance.

## 2023-11-20 DIAGNOSIS — E1165 Type 2 diabetes mellitus with hyperglycemia: Secondary | ICD-10-CM | POA: Diagnosis not present

## 2023-11-20 LAB — MAGNESIUM: Magnesium: 2.1 mg/dL (ref 1.7–2.4)

## 2023-11-20 LAB — BASIC METABOLIC PANEL WITH GFR
Anion gap: 11 (ref 5–15)
BUN: 30 mg/dL — ABNORMAL HIGH (ref 8–23)
CO2: 19 mmol/L — ABNORMAL LOW (ref 22–32)
Calcium: 8.6 mg/dL — ABNORMAL LOW (ref 8.9–10.3)
Chloride: 102 mmol/L (ref 98–111)
Creatinine, Ser: 1.76 mg/dL — ABNORMAL HIGH (ref 0.44–1.00)
GFR, Estimated: 32 mL/min — ABNORMAL LOW (ref >60)
Glucose, Bld: 232 mg/dL — ABNORMAL HIGH (ref 70–99)
Potassium: 4.4 mmol/L (ref 3.5–5.1)
Sodium: 132 mmol/L — ABNORMAL LOW (ref 135–145)

## 2023-11-20 LAB — GLUCOSE, CAPILLARY
Glucose-Capillary: 235 mg/dL — ABNORMAL HIGH (ref 70–99)
Glucose-Capillary: 267 mg/dL — ABNORMAL HIGH (ref 70–99)
Glucose-Capillary: 231 mg/dL — ABNORMAL HIGH (ref 70–99)
Glucose-Capillary: 188 mg/dL — ABNORMAL HIGH (ref 70–99)

## 2023-11-20 LAB — PHOSPHORUS: Phosphorus: 5.3 mg/dL — ABNORMAL HIGH (ref 2.5–4.6)

## 2023-11-20 MED ORDER — POLYETHYLENE GLYCOL 3350 17 G PO PACK
17.0000 g | PACK | Freq: Two times a day (BID) | ORAL | Status: DC
  Administered 2023-11-20 (×2): 17 g via ORAL
  Filled 2023-11-20 (×4): qty 1

## 2023-11-20 MED ORDER — BISACODYL 5 MG PO TBEC: 10.0000 mg | DELAYED_RELEASE_TABLET | Freq: Every day | ORAL | Status: DC | PRN

## 2023-11-20 MED ORDER — INSULIN GLARGINE-YFGN 100 UNIT/ML ~~LOC~~ SOLN
10.0000 [IU] | Freq: Once | SUBCUTANEOUS | Status: AC
  Administered 2023-11-20: 10 [IU] via SUBCUTANEOUS
  Filled 2023-11-20: qty 0.1

## 2023-11-20 MED ORDER — BISACODYL 10 MG RE SUPP: 10.0000 mg | Freq: Every day | RECTAL | Status: DC | PRN

## 2023-11-20 MED ORDER — INSULIN GLARGINE-YFGN 100 UNIT/ML ~~LOC~~ SOLN
30.0000 [IU] | SUBCUTANEOUS | Status: DC
  Administered 2023-11-21: 30 [IU] via SUBCUTANEOUS
  Filled 2023-11-20 (×2): qty 0.3

## 2023-11-20 MED ORDER — CYANOCOBALAMIN 1000 MCG/ML IJ SOLN
1000.0000 ug | Freq: Every day | INTRAMUSCULAR | Status: DC
  Administered 2023-11-20 – 2023-11-22 (×3): 1000 ug via INTRAMUSCULAR
  Filled 2023-11-20 (×3): qty 1

## 2023-11-20 MED ORDER — SODIUM BICARBONATE 8.4 % IV SOLN
100.0000 meq | Freq: Once | INTRAVENOUS | Status: AC
  Administered 2023-11-20: 100 meq via INTRAVENOUS
  Filled 2023-11-20: qty 50

## 2023-11-20 MED ORDER — VITAMIN B-12 1000 MCG PO TABS: 1000.0000 ug | ORAL_TABLET | Freq: Every day | ORAL | Status: DC

## 2023-11-20 MED ORDER — LACTATED RINGERS IV SOLN: INTRAVENOUS | Status: DC

## 2023-11-20 NOTE — Progress Notes (Signed)
 [facility] Progress Note  Patient: [name] [name]    FMW:[id number]  DOA: 11/17/2023     Date of Service: the patient was seen and examined on 11/20/2023  Chief Complaint  Patient presents with   Rash   Brief hospital course: 63 y.o. female with medical history significant of osteomyelitis of lower extremity (MRSA, morganella, and proteus isolated) s/p multiple podiatry surgeries/toe amputations, CKD, T2DM, HFpEF, pAF, COPD (3-4L baseline), hypothyroidism, iron  deficiency anemia, seropositive RA, bipolar, chronic pain syndrome who presents to ED BIB EMS due to expanding rash in b/l groin x 2-3days.   Patient notes associated burning in area of rash. Patient notes no associated n/v/d/ dysuria/fever/chills with rash. ON further ros also denies chest pain sob / abdominal pain.   ED Course:  On evaluation in ED patient was found to have abdominal cellulitis as well as severe hyperglycemia w/o ketosis.  Patient was started on antibiotics as well as insulin  drip and slated for admission. Vitals: afeb bp 139/82, hr 85, rr 18, sat 96%   Assessment and Plan:  Nonketotic Hyperglycemic state S/p Insulin  IV infusion, Switched to subcu insulin  S/p IV fluid 8/3 increased to Semglee  30 units subcu daily Monitor CBG, continue diabetic diet Monitor BMP daily   Severe b/l groin Cellulitis/intertrigo  Fungal infection with possible bacterial superinfection due to excoriation S/p vanc/zosyn  and diflucan , DC'd on 8/2. - hx of mrsa infection   Patient refused IV antibiotic and Diflucan .  Agreed for oral antibiotics Augmentin , no allergies 8/2 started Augmentin  twice daily 8/2 started clotrimazole  1% topical twice daily followed by nystatin  twice daily, clean area with soap water before each application -wound care to see  - supportive pain medication -monitor closely  -CT abd no sign of abscess of deep tissue infection     Psuedo hyponatremia due to hyperglycemia -continue to monitor labs   -continue with iv hydration    AKI on CKDII Baseline sCr 1.05 eGFR >60 on 11/18/23 8/2 metabolic acidosis, s/p bicarb 650 mg p.o. TID x 2 doses 8/3 bicarb is still low, bicarb 100 mEq IV x 1 dose given sCr 1.76 elevated, started IV fluid for gentle hydration    HFpEF -appears compensated  -resume home regimen     PAF -continue metoprolol  -continue Eliquis     COPD on chronic home O2  -continue on  3-4L baseline O2 -resume controller medications    Hypothyroidism -continue synthroid     Iron  deficiency anemia Tsat 4% 8/2 Venofer  300 mg IV one-time dose given Started oral iron  supplement and vitamin C  on discharge  Vitamin B12 level 287, goal >400. Started vitamin B12 1000 mcg IM injection daily during hospital stay, followed by oral supplement.  Follow-up PCP to repeat vitamin B12 level after 3 to 6 months.   Seropositive RA -no acute flare  -resume home regimen one infection treated    Bipolar -resume home regimen once med rec completed    Chronic pain syndrome -resume home regimen    Body mass index is 55.68 kg/m.  Interventions:  Diet: Carb modified diet DVT Prophylaxis: Eliquis   Advance goals of care discussion: Full code  Family Communication: family was not present at bedside, at the time of interview.  The pt provided permission to discuss medical plan with the family. Opportunity was given to ask question and all questions were answered satisfactorily.   Disposition:  Pt is from home, admitted with severe hyperglycemia and cellulitis, still has significant cellulitis, which precludes a safe discharge. Discharge to home, when stable,  may need few days to improve.  Subjective: No significant events overnight, patient feels improvement in the pain in the groin area after antifungal treatment.  Still has pain 6/10 more on the right side than left.  Also has chronic left shoulder pain.  Patient was complaining of constipation, had BM 3 days ago.  Requesting  for laxatives. Patient denied any other complaints.   Physical Exam: General: NAD, lying comfortably Appear in no distress, affect appropriate Eyes: PERRLA ENT: Oral Mucosa Clear, moist  Neck: no JVD,  Cardiovascular: S1 and S2 Present, no Murmur,  Respiratory: good respiratory effort, Bilateral Air entry equal and Decreased, no Crackles, no wheezes Abdomen: Bowel Sound present, Soft and no tenderness,  Skin: Erythema and excoriation around groin area and pannus  Extremities: no Pedal edema, no calf tenderness. 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  Neurologic: without any new focal findings Gait not checked due to patient safety concerns  Vitals:   11/20/23 0425 11/20/23 0814 11/20/23 1111 11/20/23 1642  BP: (!) 141/57 121/68 (!) 105/93 135/62  Pulse: 72 71 62 69  Resp: 17 20 18 20   Temp: 98.9 F (37.2 C) 97.9 F (36.6 C) 98.8 F (37.1 C) 98.6 F (37 C)  TempSrc:      SpO2: (!) 82% 95% 95% 97%  Weight:      Height:        Intake/Output Summary (Last 24 hours) at 11/20/2023 1721 Last data filed at 11/20/2023 1300 Gross per 24 hour  Intake 720 ml  Output 400 ml  Net 320 ml   Filed Weights   11/17/23 1325  Weight: (!) 156.5 kg    Data Reviewed: I have personally reviewed and interpreted daily labs, tele strips, imagings as discussed above. I reviewed all nursing notes, pharmacy notes, vitals, pertinent old records I have discussed plan of care as described above with RN and patient/family.  CBC: Recent Labs  Lab 11/17/23 1326 11/18/23 0837  WBC 8.6 6.9  NEUTROABS 7.1  --   HGB 10.2* 9.1*  HCT 34.2* 31.2*  MCV 75.0* 74.8*  PLT 292 265   Basic Metabolic Panel: Recent Labs  Lab 11/18/23 1940 11/19/23 0044 11/19/23 0456 11/19/23 1430 11/20/23 0540  NA 133* 134* 134* 133* 132*  K 4.1 4.2 4.2 4.6 4.4  CL 100 100 101 101 102  CO2 24 24 24  19* 19*  GLUCOSE 206* 240* 261* 238* 232*  BUN 16 16 17 22  30*  CREATININE  1.15* 1.22* 1.13* 1.47* 1.76*  CALCIUM  8.9 8.9 8.8* 9.1 8.6*  MG  --   --   --   --  2.1  PHOS  --   --   --   --  5.3*    Studies: No results found.  Scheduled Meds:  amoxicillin -clavulanate  1 tablet Oral Q12H   apixaban   5 mg Oral BID   ARIPiprazole   2 mg Oral QHS   vitamin C   500 mg Oral Daily   clotrimazole    Topical BID   cyanocobalamin   1,000 mcg Intramuscular Daily   Followed by   NOREEN ON 11/27/2023] vitamin B-12  1,000 mcg Oral Daily   insulin  aspart  0-20 Units Subcutaneous TID WC   insulin  aspart  0-5 Units Subcutaneous QHS   [START ON 11/21/2023] insulin  glargine-yfgn  30 Units Subcutaneous Q24H   iron  polysaccharides  150 mg Oral Daily   levothyroxine   88 mcg Oral Q0600   lidocaine   1 Application Topical Once   metoprolol  tartrate  25 mg Oral BID   polyethylene glycol  17 g Oral BID   pregabalin   100 mg Oral TID   QUEtiapine   300 mg Oral QHS   Continuous Infusions:  lactated ringers  75 mL/hr at 11/20/23 1020   promethazine  (PHENERGAN ) injection (IM or IVPB)     PRN Meds: albuterol , bisacodyl , bisacodyl , dextrose , HYDROmorphone  (DILAUDID ) injection, ondansetron  (ZOFRAN ) IV, promethazine  (PHENERGAN ) injection (IM or IVPB), traMADol , traZODone   Time spent: 55 minutes  Author: ELVAN SOR. MD [facility] 11/20/2023 5:21 PM  To reach On-call, see care teams to locate the attending and reach out to them via [email or web address]. If 7PM-7AM, please contact night-coverage If you still have difficulty reaching the attending provider, please page the [facility] (Director on Call) for [facility] on amion for assistance.

## 2023-11-20 NOTE — Plan of Care (Signed)
  Problem: Clinical Measurements: Goal: Ability to avoid or minimize complications of infection will improve Outcome: Progressing   Problem: Skin Integrity: Goal: Skin integrity will improve Outcome: Progressing   Problem: Education: Goal: Knowledge of General Education information will improve Description: Including pain rating scale, medication(s)/side effects and non-pharmacologic comfort measures Outcome: Progressing   Problem: Health Behavior/Discharge Planning: Goal: Ability to manage health-related needs will improve Outcome: Progressing   Problem: Clinical Measurements: Goal: Ability to maintain clinical measurements within normal limits will improve Outcome: Progressing Goal: Will remain free from infection Outcome: Progressing Goal: Diagnostic test results will improve Outcome: Progressing Goal: Respiratory complications will improve Outcome: Progressing Goal: Cardiovascular complication will be avoided Outcome: Progressing   Problem: Activity: Goal: Risk for activity intolerance will decrease Outcome: Progressing   Problem: Nutrition: Goal: Adequate nutrition will be maintained Outcome: Progressing

## 2023-11-21 DIAGNOSIS — E1165 Type 2 diabetes mellitus with hyperglycemia: Secondary | ICD-10-CM | POA: Diagnosis not present

## 2023-11-21 LAB — GLUCOSE, CAPILLARY
Glucose-Capillary: 210 mg/dL — ABNORMAL HIGH (ref 70–99)
Glucose-Capillary: 259 mg/dL — ABNORMAL HIGH (ref 70–99)
Glucose-Capillary: 204 mg/dL — ABNORMAL HIGH (ref 70–99)
Glucose-Capillary: 202 mg/dL — ABNORMAL HIGH (ref 70–99)
Glucose-Capillary: 189 mg/dL — ABNORMAL HIGH (ref 70–99)

## 2023-11-21 LAB — BASIC METABOLIC PANEL WITH GFR
Anion gap: 6 (ref 5–15)
BUN: 35 mg/dL — ABNORMAL HIGH (ref 8–23)
CO2: 25 mmol/L (ref 22–32)
Calcium: 8.4 mg/dL — ABNORMAL LOW (ref 8.9–10.3)
Chloride: 104 mmol/L (ref 98–111)
Creatinine, Ser: 1.61 mg/dL — ABNORMAL HIGH (ref 0.44–1.00)
GFR, Estimated: 36 mL/min — ABNORMAL LOW (ref >60)
Glucose, Bld: 196 mg/dL — ABNORMAL HIGH (ref 70–99)
Potassium: 4.4 mmol/L (ref 3.5–5.1)
Sodium: 135 mmol/L (ref 135–145)

## 2023-11-21 MED ORDER — LACTATED RINGERS IV SOLN: INTRAVENOUS | Status: AC

## 2023-11-21 MED ORDER — INSULIN ASPART 100 UNIT/ML IJ SOLN
6.0000 [IU] | Freq: Three times a day (TID) | INTRAMUSCULAR | Status: DC
  Administered 2023-11-21 – 2023-11-22 (×3): 6 [IU] via SUBCUTANEOUS
  Filled 2023-11-21 (×3): qty 1

## 2023-11-21 NOTE — Progress Notes (Signed)
 Occupational Therapy Treatment Patient Details Name: [name] [name] MRN: [id number] DOB: 1961-04-06 Today's Date: 11/21/2023   History of present illness Pt is a 63 y/o female admitted secondary to a worsening rash and hyperglycemia. Pt found to have severe bilateral groin cellulitis/intertrigo. PMH including but not limited to osteomyelitis of lower extremity (MRSA, morganella, and proteus isolated) s/p multiple podiatry surgeries/toe amputations, CKD, T2DM, HFpEF, pAF, COPD (3-4L baseline), hypothyroidism, iron  deficiency anemia, seropositive RA, bipolar, chronic pain syndrome.   OT comments  Pt is seated on EOB after working with PT on arrival. Pleasant and agreeable to OT session. She reports minimal pain to R side of abdomen. Pt performed STS and ambulation around the bed to the sink using RW with SUP. Standing oral care performed with supervision prior to returning to EOB. Pt was provided a yellow theraband and edu on BUE strengthening exercises to maximize strength and endurance for carryover to ADL performance. Sp02 stable throughout. Pt returned to bed with all needs in place and will cont to require skilled acute OT services to maximize her safety and IND to return to PLOF.       If plan is discharge home, recommend the following:  A little help with walking and/or transfers;A lot of help with bathing/dressing/bathroom;Assistance with cooking/housework   Equipment Recommendations  None recommended by OT    Recommendations for Other Services      Precautions / Restrictions Precautions Precautions: Fall Recall of Precautions/Restrictions: Intact Restrictions Weight Bearing Restrictions Per Provider Order: No       Mobility Bed Mobility                    Transfers Overall transfer level: Needs assistance Equipment used: Rolling walker (2 wheels) Transfers: Sit to/from Stand Sit to Stand: Supervision           General transfer comment: supervision to ambulate  within the room using RW, and to perform standing oral care at sink     Balance Overall balance assessment: Needs assistance Sitting-balance support: Feet supported Sitting balance-Leahy Scale: Good     Standing balance support: During functional activity, No upper extremity supported, Reliant on assistive device for balance Standing balance-Leahy Scale: Good                             ADL either performed or assessed with clinical judgement   ADL Overall ADL's : Needs assistance/impaired     Grooming: Supervision/safety;Standing;Wash/dry face;Oral care Grooming Details (indicate cue type and reason): oral care standing at sink with SUP             Lower Body Dressing: Minimal assistance;Sitting/lateral leans;With adaptive equipment Lower Body Dressing Details (indicate cue type and reason): using reacher to pull up socks seated at EOB             Functional mobility during ADLs: Supervision/safety;Rolling walker (2 wheels) General ADL Comments: supervision to ambulate around the bed and back with RW    Extremity/Trunk Assessment              Vision       Perception     Praxis     Communication Communication Communication: No apparent difficulties   Cognition Arousal: Alert Behavior During Therapy: [facility] for tasks assessed/performed  Following commands: Intact        Cueing   Cueing Techniques: Verbal cues  Exercises Other Exercises Other Exercises: Provided pt with yellow theraband and educated on 3 BUE strengthening exercises to maximize her strength and endurance for carryover to ADLs.    Shoulder Instructions       General Comments performs pericare IND with increased time and effort; stands at sink to perform hand hygiene without UE support, wide BOS    Pertinent Vitals/ Pain       Pain Assessment Pain Assessment: Faces Faces Pain Scale: Hurts little more Pain Location: R  hip/abdomen region was itchy Pain Intervention(s): Monitored during session  Home Living                                          Prior Functioning/Environment              Frequency  Min 1X/week        Progress Toward Goals  OT Goals(current goals can now be found in the care plan section)  Progress towards OT goals: Progressing toward goals  Acute Rehab OT Goals Patient Stated Goal: go home OT Goal Formulation: With patient Time For Goal Achievement: 12/03/23 Potential to Achieve Goals: Good  Plan      Co-evaluation                 AM-PAC OT 6 Clicks Daily Activity     Outcome Measure   Help from another person eating meals?: None Help from another person taking care of personal grooming?: None Help from another person toileting, which includes using toliet, bedpan, or urinal?: A Little Help from another person bathing (including washing, rinsing, drying)?: A Lot Help from another person to put on and taking off regular upper body clothing?: A Little Help from another person to put on and taking off regular lower body clothing?: A Lot 6 Click Score: 18    End of Session Equipment Utilized During Treatment: Rolling walker (2 wheels)  OT Visit Diagnosis: Other abnormalities of gait and mobility (R26.89);Muscle weakness (generalized) (M62.81)   Activity Tolerance Patient tolerated treatment well   Patient Left in bed (seated EOB)   Nurse Communication Mobility status        Time: 8879-8853 OT Time Calculation (min): 26 min  Charges: OT General Charges $OT Visit: 1 Visit OT Treatments $Self Care/Home Management : 8-22 mins $Therapeutic Exercise: 8-22 mins  [name] [name], OTR/L  11/21/23, 12:25 PM   [name] [name] 11/21/2023, 12:22 PM

## 2023-11-21 NOTE — Progress Notes (Signed)
..      [facility] REFERRAL        Occupational Therapy * Physical Therapy * Speech Therapy                           DATE: 11/21/2023  PATIENT NAME: [name] [name]  PATIENT MRN: [id number]        DIAGNOSIS/DIAGNOSIS CODE: severe hyperglycemia due to diabete mellitus E11.65   DATE OF DISCHARGE: 11/22/2023       PRIMARY CARE PHYSICIAN      PCP PHONE/FAX S[facility] Snide [phone]      Dear Provider (Name: [facility] outpatient __  Fax: [phone]   I certify that I have examined this patient and that occupational/physical/speech therapy is necessary on an outpatient basis.    The patient has expressed interest in completing their recommended course of therapy at your  location.  Once a formal order from the patient's primary care physician has been obtained, please  contact him/her to schedule an appointment for evaluation at your earliest convenience.   [ X]  Physical Therapy Evaluate and Treat  [  X]  Occupational Therapy Evaluate and Treat  [  ]  Speech Therapy Evaluate and Treat         The patient's primary care physician (listed above) must furnish and be responsible for a formal order such that the recommended services may be furnished while under the primary physician's care, and that the plan of care will be established and reviewed every 30 days (or more often if condition necessitates).

## 2023-11-21 NOTE — TOC Progression Note (Signed)
 Transition of Care [facility]) - Progression Note    Patient Details  Name: [name] [name] MRN: [id number] Date of Birth: 09-18-1960  Transition of Care [facility] CM/SW Contact  Dalia GORMAN Fuse, RN Phone Number: 11/21/2023, 2:05 PM  Clinical Narrative:    Therapy recommending HH PT/OT. TOC outreached to [facility] agencies, but unable to find an agency INN with the patient's health plan. TOC sent referral for Outpatient PT/OT to [facility]. No other TOC needs identified                     Expected Discharge Plan and Services       Living arrangements for the past 2 months: Single Family Home                                       Social Drivers of Health (SDOH) Interventions SDOH Screenings   Food Insecurity: No Food Insecurity (11/17/2023)  Housing: Low Risk  (11/17/2023)  Transportation Needs: No Transportation Needs (11/17/2023)  Utilities: Not At Risk (11/17/2023)  Depression (PHQ2-9): Low Risk  (10/06/2023)  Financial Resource Strain: Medium Risk (06/02/2022)   [facility]  Stress: Stress Concern Present (04/28/2022)  Tobacco Use: Medium Risk (11/14/2023)    Readmission Risk Interventions    11/18/2023   12:35 PM 03/04/2023   11:07 AM 01/05/2022   11:09 AM  Readmission Risk Prevention Plan  Transportation Screening Complete Complete Complete  PCP or Specialist Appt within 3-5 Days Complete  Complete  Social Work Consult for Recovery Care Planning/Counseling Complete  Complete  Palliative Care Screening Not Applicable  Not Applicable  Medication Review Oceanographer) Complete Complete Complete  PCP or Specialist appointment within 3-5 days of discharge  Complete   SW Recovery Care/Counseling Consult  Complete   Palliative Care Screening  Not Applicable   Skilled Nursing Facility  Not Applicable

## 2023-11-21 NOTE — Telephone Encounter (Signed)
 Pharmacy Patient Advocate Encounter  Received notification from [employer] that Prior Authorization for FreeStyle Libre 3 Plus Sensor has been DENIED.  Full denial letter will be uploaded to the media tab. See denial reason below.   PA #/Case ID/Reference #: [id number]

## 2023-11-21 NOTE — Inpatient Diabetes Management (Signed)
 Inpatient Diabetes Program Recommendations  AACE/ADA: New Consensus Statement on Inpatient Glycemic Control   Target Ranges:  Prepandial:   less than 140 mg/dL      Peak postprandial:   less than 180 mg/dL (1-2 hours)      Critically ill patients:  140 - 180 mg/dL    Latest Reference Range & Units 11/20/23 08:15 11/20/23 11:11 11/20/23 16:39 11/20/23 19:38 11/21/23 08:12  Glucose-Capillary 70 - 99 mg/dL 764 (H) 732 (H) 768 (H) 188 (H) 210 (H)   Review of Glycemic Control  Diabetes history: DM2 Outpatient Diabetes medications:  Tradjenta  5 mg Daily, Jardiance 10 mg Daily, Lantus  20 units Daily, Humalog  20 units tid (per pt report takes insulin  at home both short and long acting insulin )  Current orders for Inpatient glycemic control: Semglee  30 units Q24H, Novolog  0-20 units TID with meals, Novolog  0-5 units QHS  Inpatient Diabetes Program Recommendations:    Insulin : Please consider ordering Novolog  6 units TID with meals for meal coverage if patient eats at least 50% of meals.  Thanks, Earnie Gainer, RN, MSN, CDCES Diabetes Coordinator Inpatient Diabetes Program [phone] (Team Pager from 8am to 5pm)

## 2023-11-21 NOTE — Progress Notes (Signed)
 [facility] Progress Note  Patient: [name] [name]    FMW:[id number]  DOA: 11/17/2023     Date of Service: the patient was seen and examined on 11/21/2023  Chief Complaint  Patient presents with   Rash   Brief hospital course: 63 y.o. female with medical history significant of osteomyelitis of lower extremity (MRSA, morganella, and proteus isolated) s/p multiple podiatry surgeries/toe amputations, CKD, T2DM, HFpEF, pAF, COPD (3-4L baseline), hypothyroidism, iron  deficiency anemia, seropositive RA, bipolar, chronic pain syndrome who presents to ED BIB EMS due to expanding rash in b/l groin x 2-3days.   Patient notes associated burning in area of rash. Patient notes no associated n/v/d/ dysuria/fever/chills with rash. ON further ros also denies chest pain sob / abdominal pain.   ED Course:  On evaluation in ED patient was found to have abdominal cellulitis as well as severe hyperglycemia w/o ketosis.  Patient was started on antibiotics as well as insulin  drip and slated for admission. Vitals: afeb bp 139/82, hr 85, rr 18, sat 96%   Assessment and Plan:  Nonketotic Hyperglycemic state S/p Insulin  IV infusion, Switched to subcu insulin  S/p IV fluid 8/3 increased to Semglee  30 units subcu daily 8/4 started NovoLog  6 units 3 times daily with meals Monitor CBG, continue diabetic diet Monitor BMP daily   Severe b/l groin Cellulitis/intertrigo  Fungal infection with possible bacterial superinfection due to excoriation S/p vanc/zosyn  and diflucan , DC'd on 8/2. - hx of mrsa infection   Patient refused IV antibiotic and Diflucan .  Agreed for oral antibiotics Augmentin , no allergies 8/2 started Augmentin  twice daily 8/2 started clotrimazole  1% topical twice daily followed by nystatin  twice daily, clean area with soap water before each application -wound care to see  - supportive pain medication -monitor closely  -CT abd no sign of abscess of deep tissue infection     Psuedo hyponatremia  due to hyperglycemia -continue to monitor labs  -continue with iv hydration    AKI on CKDII Baseline sCr 1.05 eGFR >60 on 11/18/23 8/2 metabolic acidosis, s/p bicarb 650 mg p.o. TID x 2 doses 8/3 bicarb is still low, bicarb 100 mEq IV x 1 dose given sCr 1.76 elevated, started IV fluid for gentle hydration 8/4 sCr 1.6, still elevated, continue IV fluids   HFpEF -appears compensated  -resume home regimen     PAF -continue metoprolol  -continue Eliquis     COPD on chronic home O2  -continue on  3-4L baseline O2 -resume controller medications    Hypothyroidism -continue synthroid     Iron  deficiency anemia Tsat 4% 8/2 Venofer  300 mg IV one-time dose given Started oral iron  supplement and vitamin C  on discharge  Vitamin B12 level 287, goal >400. Started vitamin B12 1000 mcg IM injection daily during hospital stay, followed by oral supplement.  Follow-up PCP to repeat vitamin B12 level after 3 to 6 months.   Seropositive RA -no acute flare  -resume home regimen one infection treated    Bipolar -resume home regimen once med rec completed    Chronic pain syndrome -resume home regimen    Body mass index is 55.68 kg/m.  Interventions:  Diet: Carb modified diet DVT Prophylaxis: Eliquis   Advance goals of care discussion: Full code  Family Communication: family was not present at bedside, at the time of interview.  The pt provided permission to discuss medical plan with the family. Opportunity was given to ask question and all questions were answered satisfactorily.   Disposition:  Pt is from home, admitted with severe  hyperglycemia and cellulitis, still has significant cellulitis, which precludes a safe discharge. Discharge to home, when stable, may need few days to improve.  Subjective: No significant events overnight, groin pain is still elevated 8/10 after working with PT and OT.  Overall she feels improvement. Patient did move bowels yesterday.    Physical  Exam: General: NAD, lying comfortably Appear in no distress, affect appropriate Eyes: PERRLA ENT: Oral Mucosa Clear, moist  Neck: no JVD,  Cardiovascular: S1 and S2 Present, no Murmur,  Respiratory: good respiratory effort, Bilateral Air entry equal and Decreased, no Crackles, no wheezes Abdomen: Bowel Sound present, Soft and no tenderness,  Skin: Erythema and excoriation around groin area and pannus  Extremities: no Pedal edema, no calf tenderness. 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  Neurologic: without any new focal findings Gait not checked due to patient safety concerns  Vitals:   11/21/23 0017 11/21/23 0408 11/21/23 0837 11/21/23 1552  BP: (!) 133/58 139/62 (!) 146/70 (!) 126/54  Pulse: 69 65 65 69  Resp: 18 18 19 18   Temp: 98.2 F (36.8 C) 98.2 F (36.8 C) 99 F (37.2 C) 98.6 F (37 C)  TempSrc:      SpO2: 96% 92% 99% 95%  Weight:      Height:        Intake/Output Summary (Last 24 hours) at 11/21/2023 1835 Last data filed at 11/21/2023 1640 Gross per 24 hour  Intake 2443.28 ml  Output --  Net 2443.28 ml   Filed Weights   11/17/23 1325  Weight: (!) 156.5 kg    Data Reviewed: I have personally reviewed and interpreted daily labs, tele strips, imagings as discussed above. I reviewed all nursing notes, pharmacy notes, vitals, pertinent old records I have discussed plan of care as described above with RN and patient/family.  CBC: Recent Labs  Lab 11/17/23 1326 11/18/23 0837  WBC 8.6 6.9  NEUTROABS 7.1  --   HGB 10.2* 9.1*  HCT 34.2* 31.2*  MCV 75.0* 74.8*  PLT 292 265   Basic Metabolic Panel: Recent Labs  Lab 11/19/23 0044 11/19/23 0456 11/19/23 1430 11/20/23 0540 11/21/23 0515  NA 134* 134* 133* 132* 135  K 4.2 4.2 4.6 4.4 4.4  CL 100 101 101 102 104  CO2 24 24 19* 19* 25  GLUCOSE 240* 261* 238* 232* 196*  BUN 16 17 22  30* 35*  CREATININE 1.22* 1.13* 1.47* 1.76* 1.61*  CALCIUM  8.9 8.8* 9.1 8.6* 8.4*   MG  --   --   --  2.1  --   PHOS  --   --   --  5.3*  --     Studies: No results found.  Scheduled Meds:  amoxicillin -clavulanate  1 tablet Oral Q12H   apixaban   5 mg Oral BID   ARIPiprazole   2 mg Oral QHS   vitamin C   500 mg Oral Daily   clotrimazole    Topical BID   cyanocobalamin   1,000 mcg Intramuscular Daily   Followed by   NOREEN ON 11/27/2023] vitamin B-12  1,000 mcg Oral Daily   insulin  aspart  0-20 Units Subcutaneous TID WC   insulin  aspart  0-5 Units Subcutaneous QHS   insulin  glargine-yfgn  30 Units Subcutaneous Q24H   iron  polysaccharides  150 mg Oral Daily   levothyroxine   88 mcg Oral Q0600   lidocaine   1 Application Topical Once   metoprolol  tartrate  25 mg Oral BID   polyethylene  glycol  17 g Oral BID   pregabalin   100 mg Oral TID   QUEtiapine   300 mg Oral QHS   Continuous Infusions:  lactated ringers  75 mL/hr at 11/21/23 1640   promethazine  (PHENERGAN ) injection (IM or IVPB)     PRN Meds: albuterol , bisacodyl , bisacodyl , dextrose , HYDROmorphone  (DILAUDID ) injection, ondansetron  (ZOFRAN ) IV, promethazine  (PHENERGAN ) injection (IM or IVPB), traMADol , traZODone   Time spent: 40 minutes  Author: ELVAN SOR. MD [facility] 11/21/2023 6:35 PM  To reach On-call, see care teams to locate the attending and reach out to them via [email or web address]. If 7PM-7AM, please contact night-coverage If you still have difficulty reaching the attending provider, please page the [facility] (Director on Call) for [facility] on amion for assistance.

## 2023-11-21 NOTE — Progress Notes (Signed)
 Physical Therapy Treatment Patient Details Name: [name] [name] MRN: [id number] DOB: 1960-11-28 Today's Date: 11/21/2023   History of Present Illness Pt is a 63 y/o female admitted secondary to a worsening rash and hyperglycemia. Pt found to have severe bilateral groin cellulitis/intertrigo. PMH including but not limited to osteomyelitis of lower extremity (MRSA, morganella, and proteus isolated) s/p multiple podiatry surgeries/toe amputations, CKD, T2DM, HFpEF, pAF, COPD (3-4L baseline), hypothyroidism, iron  deficiency anemia, seropositive RA, bipolar, chronic pain syndrome.    PT Comments  Pt tolerates treatment well this date and is able to improve assist levels, standing balance, ambulation distance, and activity tolerance since last session. Today, she is mod I for bed mobility, supervision for transfers, and supervision to ambulate 165ft in RW. Increased c/o R hip pain and concern for buckling noted towards end of ambulation requiring multiple standing rest breaks; no buckling noted by PT. SpO2 stable on 4L with desat to 92% after gait, quick recovery to 94% with seated rest break.   Is IND with pericare and hand hygiene after toileting, demonstrating good standing balance without UE support. Despite progress, she continues to be limited with meeting goals secondary to increased pain levels and decreased activity tolerance. Pt will continue to benefit from skilled acute PT services to address deficits for return to baseline function.    If plan is discharge home, recommend the following: A little help with bathing/dressing/bathroom;Assistance with cooking/housework;Help with stairs or ramp for entrance   Can travel by private vehicle        Equipment Recommendations  None recommended by PT    Recommendations for Other Services       Precautions / Restrictions Precautions Precautions: Fall Recall of Precautions/Restrictions: Intact Precaution/Restrictions Comments: SpO2 >/=  92% Restrictions Weight Bearing Restrictions Per Provider Order: No     Mobility  Bed Mobility Overal bed mobility: Modified Independent             General bed mobility comments: mod I for sup>sit with HOB elevated and use of BUE for support, increased time/effort for BLE facilition to EOB    Transfers                   General transfer comment: supervision for safety to perform STS transfers at EOB and low height commode with RW, proper hand placement without verbal cues, increased time/effort to achieve full upright standing. Demonstrates good eccentric lowering with proper hand placement.    Ambulation/Gait Ambulation/Gait assistance: Supervision Gait Distance (Feet): 120 Feet           General Gait Details: Supervision for safety to ambulate multiple short bouts in RW. Demonstrates increase UE WB on RW, decreased step length/foot clearance bil, and slowed cadence. Increased c/o R hip pain towards end of gait requiring multiple short standing rest breaks. SpO2 desat to 92% on 4L with quick recovery to 94% with seated rest break.     Balance Overall balance assessment: Needs assistance Sitting-balance support: Feet supported Sitting balance-Leahy Scale: Good     Standing balance support: During functional activity, No upper extremity supported, Reliant on assistive device for balance Standing balance-Leahy Scale: Good                              Communication Communication Communication: No apparent difficulties  Cognition Arousal: Alert Behavior During Therapy: WFL for tasks assessed/performed   PT - Cognitive impairments: No apparent impairments  Following commands: Intact      Cueing Cueing Techniques: Verbal cues  Exercises Other Exercises Other Exercises: Pt educated re: PT role/POC, DC recommendations, energy conservation/pacing, OOB amb to/from bathrom with nursing. She verbalized  understanding.    General Comments General comments (skin integrity, edema, etc.): performs pericare IND with increased time and effort; stands at sink to perform hand hygiene without UE support, wide BOS      Pertinent Vitals/Pain Pain Assessment Pain Score: 7  Pain Location: R hip and L shoulder Pain Descriptors / Indicators: Aching, Sharp Pain Intervention(s): Limited activity within patient's tolerance, Monitored during session, Repositioned     PT Goals (current goals can now be found in the care plan section) Acute Rehab PT Goals Patient Stated Goal: decrease pain PT Goal Formulation: With patient Time For Goal Achievement: 12/03/23 Potential to Achieve Goals: Good Progress towards PT goals: Progressing toward goals    Frequency    Min 2X/week       AM-PAC PT 6 Clicks Mobility   Outcome Measure  Help needed turning from your back to your side while in a flat bed without using bedrails?: None Help needed moving from lying on your back to sitting on the side of a flat bed without using bedrails?: None Help needed moving to and from a bed to a chair (including a wheelchair)?: A Little Help needed standing up from a chair using your arms (e.g., wheelchair or bedside chair)?: A Little Help needed to walk in hospital room?: A Little Help needed climbing 3-5 steps with a railing? : A Little 6 Click Score: 20    End of Session Equipment Utilized During Treatment: Gait belt Activity Tolerance: Patient tolerated treatment well;Patient limited by fatigue Patient left: in bed;Other (comment);with call bell/phone within reach (left seated EOB per pt request with OT follow up) Nurse Communication: Mobility status PT Visit Diagnosis: Other abnormalities of gait and mobility (R26.89)     Time: 8940-8874 PT Time Calculation (min) (ACUTE ONLY): 26 min  Charges:    $Therapeutic Activity: 23-37 mins PT General Charges $$ ACUTE PT VISIT: 1 Visit                        Camie CHARLENA Kluver, PT, DPT 12:10 PM,11/21/23 Physical Therapist - [facility]

## 2023-11-21 NOTE — Plan of Care (Signed)
   Problem: Clinical Measurements: Goal: Ability to avoid or minimize complications of infection will improve Outcome: Progressing   Problem: Skin Integrity: Goal: Skin integrity will improve Outcome: Progressing   Problem: Education: Goal: Knowledge of General Education information will improve Description: Including pain rating scale, medication(s)/side effects and non-pharmacologic comfort measures Outcome: Progressing   Problem: Health Behavior/Discharge Planning: Goal: Ability to manage health-related needs will improve Outcome: Progressing   Problem: Clinical Measurements: Goal: Ability to maintain clinical measurements within normal limits will improve Outcome: Progressing Goal: Will remain free from infection Outcome: Progressing Goal: Diagnostic test results will improve Outcome: Progressing Goal: Respiratory complications will improve Outcome: Progressing Goal: Cardiovascular complication will be avoided Outcome: Progressing   Problem: Activity: Goal: Risk for activity intolerance will decrease Outcome: Progressing   Problem: Nutrition: Goal: Adequate nutrition will be maintained Outcome: Progressing   Problem: Coping: Goal: Level of anxiety will decrease Outcome: Progressing   Problem: Elimination: Goal: Will not experience complications related to bowel motility Outcome: Progressing Goal: Will not experience complications related to urinary retention Outcome: Progressing   Problem: Pain Managment: Goal: General experience of comfort will improve and/or be controlled Outcome: Progressing   Problem: Safety: Goal: Ability to remain free from injury will improve Outcome: Progressing   Problem: Skin Integrity: Goal: Risk for impaired skin integrity will decrease Outcome: Progressing

## 2023-11-22 ENCOUNTER — Other Ambulatory Visit: Payer: Self-pay

## 2023-11-22 DIAGNOSIS — E1165 Type 2 diabetes mellitus with hyperglycemia: Secondary | ICD-10-CM | POA: Diagnosis not present

## 2023-11-22 LAB — CULTURE, BLOOD (ROUTINE X 2)
Culture: NO GROWTH
Culture: NO GROWTH
Special Requests: ADEQUATE

## 2023-11-22 LAB — BASIC METABOLIC PANEL WITH GFR
Anion gap: 6 (ref 5–15)
BUN: 29 mg/dL — ABNORMAL HIGH (ref 8–23)
CO2: 24 mmol/L (ref 22–32)
Calcium: 8.5 mg/dL — ABNORMAL LOW (ref 8.9–10.3)
Chloride: 107 mmol/L (ref 98–111)
Creatinine, Ser: 1.22 mg/dL — ABNORMAL HIGH (ref 0.44–1.00)
GFR, Estimated: 50 mL/min — ABNORMAL LOW (ref >60)
Glucose, Bld: 236 mg/dL — ABNORMAL HIGH (ref 70–99)
Potassium: 4.2 mmol/L (ref 3.5–5.1)
Sodium: 137 mmol/L (ref 135–145)

## 2023-11-22 LAB — GLUCOSE, CAPILLARY
Glucose-Capillary: 200 mg/dL — ABNORMAL HIGH (ref 70–99)
Glucose-Capillary: 213 mg/dL — ABNORMAL HIGH (ref 70–99)

## 2023-11-22 MED ORDER — POLYETHYLENE GLYCOL 3350 17 GM/SCOOP PO POWD
17.0000 g | Freq: Two times a day (BID) | ORAL | 0 refills | Status: AC
  Filled 2023-11-22: qty 238, 7d supply, fill #0

## 2023-11-22 MED ORDER — NYSTATIN 100000 UNIT/GM EX POWD
1.0000 | Freq: Two times a day (BID) | CUTANEOUS | 0 refills | Status: AC
  Filled 2023-11-22: qty 15, 30d supply, fill #0

## 2023-11-22 MED ORDER — CYANOCOBALAMIN 1000 MCG PO TABS
1000.0000 ug | ORAL_TABLET | Freq: Every day | ORAL | 2 refills | Status: AC
  Filled 2023-11-22: qty 30, 30d supply, fill #0

## 2023-11-22 MED ORDER — BISACODYL 5 MG PO TBEC
10.0000 mg | DELAYED_RELEASE_TABLET | Freq: Every day | ORAL | 0 refills | Status: AC | PRN
  Filled 2023-11-22: qty 30, 15d supply, fill #0

## 2023-11-22 MED ORDER — POLYSACCHARIDE IRON COMPLEX 150 MG PO CAPS
150.0000 mg | ORAL_CAPSULE | Freq: Every day | ORAL | 2 refills | Status: AC
  Filled 2023-11-22: qty 30, 30d supply, fill #0

## 2023-11-22 MED ORDER — INSULIN GLARGINE-YFGN 100 UNIT/ML ~~LOC~~ SOLN
45.0000 [IU] | SUBCUTANEOUS | Status: DC
  Administered 2023-11-22: 45 [IU] via SUBCUTANEOUS
  Filled 2023-11-22: qty 0.45

## 2023-11-22 MED ORDER — CLOTRIMAZOLE 1 % EX CREA
TOPICAL_CREAM | Freq: Two times a day (BID) | CUTANEOUS | 1 refills | Status: AC
  Filled 2023-11-22: qty 28, 28d supply, fill #0

## 2023-11-22 NOTE — Discharge Summary (Signed)
 [facility] Discharge Summary   Patient: [name] [name] FMW:[id number]  PCP: Geralene Levorn ORN, NP  Date of admission: 11/17/2023   Date of discharge:  11/22/2023     Discharge Diagnoses:  Principal Problem:   Severe hyperglycemia due to diabetes mellitus (HCC)   Admitted From: Home Disposition: Home, with outpatient physical therapy  Recommendations for Outpatient Follow-up:  PCP: Follow-up with PCP in 1 week, continue to monitor BP at home and follow with PCP to titrate medication accordingly. Resumed amlodipine , restart if systolic BP greater than 140 mmHg  Follow up LABS/TEST: As above   Follow-up Information     [facility] Follow up.   Specialty: Rehabilitation Why: Facility will call to schedule the first appointment. Contact information: [address] [place]  [address] [phone]        Geralene Levorn ORN, NP Follow up.   Why: hospital follow up Contact information: [address] [place] [address] [phone]                Diet recommendation: Cardiac and Carb modified diet  Activity: The patient is advised to gradually reintroduce usual activities, as tolerated  Discharge Condition: stable  Code Status: Full code   History of present illness: As per the H and P dictated on admission.  Hospital Course:  63 y.o. female with medical history significant of osteomyelitis of lower extremity (MRSA, morganella, and proteus isolated) s/p multiple podiatry surgeries/toe amputations, CKD, T2DM, HFpEF, pAF, COPD (3-4L baseline), hypothyroidism, iron  deficiency anemia, seropositive RA, bipolar, chronic pain syndrome who presents to ED BIB EMS due to expanding rash in b/l groin x 2-3days.   Patient notes associated burning in area of rash. Patient notes no associated n/v/d/ dysuria/fever/chills with rash. ON further ros also denies chest pain sob / abdominal pain.   ED Course:  On evaluation in  ED patient was found to have abdominal cellulitis as well as severe hyperglycemia w/o ketosis.  Patient was started on antibiotics as well as insulin  drip and slated for admission. Vitals: afeb bp 139/82, hr 85, rr 18, sat 96%    Assessment and Plan:  # Nonketotic Hyperglycemic state: Resolved S/p Insulin  IV infusion, Switched to subcu insulin  S/p IV fluid 8/3 increased to Semglee  30 units subcu daily 8/4 started NovoLog  6 units 3 times daily with meals 8/5 patient was discharged on Lantus  35 units subcu daily and  Humalog  15 units 3 times daily.   Resumed Tradjenta  and Jardiance Patient was advised to continue sliding scale, diabetic diet and monitor CBG at home.  Follow with PCP for further management.    # Severe b/l groin Cellulitis/intertrigo  Fungal infection with possible bacterial superinfection due to excoriation. S/p vanc/zosyn  and diflucan , DC'd on 8/2. - hx of mrsa infection   Patient refused IV antibiotic and Diflucan .  Agreed for oral antibiotics Augmentin , no allergies 8/2 started Augmentin  twice daily.  8/2 started clotrimazole  1% topical twice daily followed by nystatin  twice daily, clean area with soap water before each application. wound care consulted. Prn pain medication CT abd no sign of abscess of deep tissue infection  Patient was discharged on clotrimazole  cream twice daily followed by nystatin  powder after 30 minutes to 1 hour after clotrimazole  twice daily.  Advised to wash area with soap water before each application.  Keep inguinal area dry as much as possible and exposed to air to avoid moisture.  No more need of antibiotics.   # Psuedo hyponatremia  due to hyperglycemia.  Resolved.   # AKI on CKDII: Baseline sCr 1.05 eGFR >60 on 11/18/23 8/2 metabolic acidosis, s/p bicarb 650 mg p.o. TID x 2 doses 8/3 bicarb is still low, bicarb 100 mEq IV x 1 dose given sCr 1.76 elevated, started IV fluid for gentle hydration 8/4 sCr 1.6, still elevated, continue IV  fluids 8/5 sCr 1.22 improved, patient was advised to continue adequate oral hydration avoid excessive fluid.  Use 1.5 to 2 L fluid maximum.   # HFpEF, HTN, HLD: appears compensated,  Resumed amlodipine , Lipitor , metoprolol  Home meds   # PAF: continue metoprolol  25 twice daily, and Eliquis  5 bid   # COPD on chronic home O2, 3-4L baseline O2 Continue home inhalers.  # Hypothyroidism: continue synthroid    # Iron  deficiency anemia. Tsat 4% 8/2 Venofer  300 mg IV one-time dose given Started oral iron  supplement and vitamin C  on discharge   # Vitamin B12 level 287, goal >400. S/p vitamin B12 1000 mcg IM injection daily during hospital stay, followed by oral supplement.  Follow-up PCP to repeat vitamin B12 level after 3 to 6 months.   # Seropositive RA: no acute flare  -resumed methotrexate  and Plaquenil    # Bipolar: Abilify , pramipexole , Seroquel , trazodone  Home medications   # Chronic pain syndrome: Resumed pregabalin  and Tylenol  as needed Home meds    # Obesity class III, morbid obesity Body mass index is 55.68 kg/m.  Nutrition Interventions: Calorie restricted diet and daily exercise advised to lose body weight.  Lifestyle modification discussed.   - Patient was instructed, not to drive, operate heavy machinery, perform activities at heights, swimming or participation in water activities or provide baby sitting services while on Pain, Sleep and Anxiety Medications; until her outpatient Physician has advised to do so again.  - Also recommended to not to take more than prescribed Pain, Sleep and Anxiety Medications.  Patient was seen by physical therapy, who recommended Therapy, which was arranged. On the day of the discharge the patient's vitals were stable, and no other acute medical condition were reported by patient. the patient was felt safe to be discharge at Home with Therapy.  Consultants: Diabetic coordinator and wound care RN Procedures: None  Discharge Exam: General:  Appear in no distress, no Rash; Oral Mucosa Clear, moist. Cardiovascular: S1 and S2 Present, no Murmur, Respiratory: normal respiratory effort, Bilateral Air entry present and no Crackles, no wheezes Abdomen: Bowel Sound present, Soft and no tenderness, no hernia.  Inguinal erythema due to fungal infection Extremities: no Pedal edema, no calf tenderness Neurology: alert and oriented to time, place, and person affect appropriate.  Filed Weights   11/17/23 1325  Weight: (!) 156.5 kg   Vitals:   11/22/23 0521 11/22/23 0748  BP: (!) 121/58 128/68  Pulse: 61 62  Resp: 18 16  Temp: 97.6 F (36.4 C) 97.7 F (36.5 C)  SpO2: 95% 98%    DISCHARGE MEDICATION: Allergies as of 11/22/2023       Reactions   Cefepime  Anaphylaxis, Swelling   lips swell and the break out in blisters. States lip swelling upon receiving it   Ceftriaxone  Anaphylaxis   Angioedema while admitted at [facility] 01/12/2023 Tolerated amoxicillin     Cephalexin  Hives   Codeine  Palpitations, Nausea Only, Nausea And Vomiting, Rash, Shortness Of Breath   makes heart fly, she gets flushed and passes out   Doxycycline Rash   Propoxyphene Rash, Shortness Of Breath   Increase heart rate   Sulfa Antibiotics Palpitations, Nausea Only,  Shortness Of Breath, Hives   makes heart fly, she gets flushed and passes out   Clindamycin  Rash   Tongue swelling, oral sores, and Mouth rash   Daptomycin  Other (See Comments)   Fever and pulmonary infiltrate   Lovenox  [enoxaparin  Sodium] Hives   Hydrocodone Nausea And Vomiting   Hear racing & breaks out into a cold sweat.   Linezolid  Dermatitis   Swelling and rash around eyes   Meropenem Rash   Erythematous, hot, pruritic rash over arms, chest, back, abdomen, and face occurred at the end of meropenem infusion on 02/22/18        Medication List     STOP taking these medications    amoxicillin -clavulanate 875-125 MG tablet Commonly known as: AUGMENTIN    ascorbic acid  500 MG  tablet Commonly known as: VITAMIN C    GNP Calcium  Citrate+D Maximum 315-6.25 MG-MCG Tabs Generic drug: Calcium  Citrate-Vitamin D3   linezolid  600 MG tablet Commonly known as: ZYVOX    metFORMIN  500 MG 24 hr tablet Commonly known as: GLUCOPHAGE -XR   nystatin  cream Commonly known as: MYCOSTATIN  Replaced by: nystatin  powder   venlafaxine  XR 37.5 MG 24 hr capsule Commonly known as: EFFEXOR -XR   venlafaxine  XR 75 MG 24 hr capsule Commonly known as: EFFEXOR -XR   Vitamin D  125 MCG (5000 UT) Caps   zinc  sulfate (50mg  elemental zinc ) 220 (50 Zn) MG capsule       TAKE these medications    acetaminophen  325 MG tablet Commonly known as: TYLENOL  Take 2 tablets (650 mg total) by mouth every 6 (six) hours as needed for mild pain or moderate pain (or Fever >/= 101).   albuterol  108 (90 Base) MCG/ACT inhaler Commonly known as: VENTOLIN  HFA Inhale 2 puffs into the lungs every 6 (six) hours as needed for wheezing or shortness of breath.   amLODipine  5 MG tablet Commonly known as: NORVASC  Take 2 tablets (10 mg total) by mouth daily. Only take it if systolic BP greater than 140 mmHg What changed: additional instructions   apixaban  5 MG Tabs tablet Commonly known as: Eliquis  Take 1 tablet (5 mg total) by mouth 2 (two) times daily.   ARIPiprazole  2 MG tablet Commonly known as: ABILIFY  Take 1 tablet (2 mg total) by mouth at bedtime. Start taking on: November 29, 2023   atorvastatin  80 MG tablet Commonly known as: LIPITOR  Take 80 mg by mouth daily.   bisacodyl  5 MG EC tablet Commonly known as: DULCOLAX Take 2 tablets (10 mg total) by mouth daily as needed for moderate constipation.   Breztri  Aerosphere 160-9-4.8 MCG/ACT Aero inhaler Generic drug: budesonide -glycopyrrolate -formoterol  Inhale 2 puffs into the lungs in the morning and at bedtime.   clotrimazole  1 % cream Commonly known as: LOTRIMIN  Apply topically 2 (two) times daily for 28 days. Clean the area with soap and water  before each application   cyanocobalamin  1000 MCG tablet Take 1 tablet (1,000 mcg total) by mouth daily. Start taking on: November 27, 2023   famotidine  20 MG tablet Commonly known as: PEPCID  Take 1 tablet (20 mg total) by mouth daily.   folic acid  1 MG tablet Commonly known as: FOLVITE  Take 1 mg by mouth daily.   HumaLOG  KwikPen 100 UNIT/ML KwikPen Generic drug: insulin  lispro Inject 15 Units into the skin 3 (three) times daily. What changed: how much to take   hydroxychloroquine  200 MG tablet Commonly known as: PLAQUENIL  Take 200 mg by mouth 2 (two) times daily.   insulin  glargine 100 unit/mL Sopn Commonly known as:  LANTUS  Inject 35 Units into the skin. What changed: how much to take   iron  polysaccharides 150 MG capsule Commonly known as: NIFEREX Take 1 capsule (150 mg total) by mouth daily. Start taking on: November 23, 2023   Jardiance 10 MG Tabs tablet Generic drug: empagliflozin Take 10 mg by mouth daily.   levothyroxine  88 MCG tablet Commonly known as: SYNTHROID  Take 88 mcg by mouth daily before breakfast.   methotrexate  2.5 MG tablet Commonly known as: RHEUMATREX Take 15 mg by mouth once a week. Sunday   metoCLOPramide  5 MG/5ML solution Commonly known as: REGLAN  Take 5 mg by mouth daily.   metoprolol  tartrate 25 MG tablet Commonly known as: LOPRESSOR  Take 25 mg by mouth 2 (two) times daily.   nystatin  powder Commonly known as: MYCOSTATIN /NYSTOP  Apply 1 Application topically 2 (two) times daily. Apply 30 min to 1 hour after clotrimazole  cream Replaces: nystatin  cream   ondansetron  4 MG disintegrating tablet Commonly known as: ZOFRAN -ODT Take 4 mg by mouth every 8 (eight) hours as needed.   polyethylene glycol 17 g packet Commonly known as: MIRALAX  / GLYCOLAX  Take 17 g by mouth 2 (two) times daily.   pramipexole  0.5 MG tablet Commonly known as: MIRAPEX  Take 0.5 mg by mouth at bedtime.   pregabalin  100 MG capsule Commonly known as: Lyrica  Take  1 capsule (100 mg total) by mouth 3 (three) times daily.   promethazine  12.5 MG tablet Commonly known as: PHENERGAN  Take 12.5 mg by mouth.   QUEtiapine  300 MG tablet Commonly known as: SEROQUEL  Take 1 tablet (300 mg total) by mouth at bedtime. Start taking on: November 29, 2023   Tradjenta  5 MG Tabs tablet Generic drug: linagliptin  Take 5 mg by mouth daily.   traZODone  100 MG tablet Commonly known as: DESYREL  Take 1 tablet (100 mg total) by mouth at bedtime as needed for sleep.               Discharge Care Instructions  (From admission, onward)           Start     Ordered   11/22/23 0000  Discharge wound care:       Comments: As above   11/22/23 1425           Allergies  Allergen Reactions   Cefepime  Anaphylaxis and Swelling    lips swell and the break out in blisters.  States lip swelling upon receiving it   Ceftriaxone  Anaphylaxis    Angioedema while admitted at [facility] 01/12/2023 Tolerated amoxicillin     Cephalexin  Hives   Codeine  Palpitations, Nausea Only, Nausea And Vomiting, Rash and Shortness Of Breath    makes heart fly, she gets flushed and passes out   Doxycycline Rash   Propoxyphene Rash and Shortness Of Breath    Increase heart rate   Sulfa Antibiotics Palpitations, Nausea Only, Shortness Of Breath and Hives    makes heart fly, she gets flushed and passes out   Clindamycin  Rash    Tongue swelling, oral sores, and Mouth rash   Daptomycin  Other (See Comments)    Fever and pulmonary infiltrate   Lovenox  [Enoxaparin  Sodium] Hives   Hydrocodone Nausea And Vomiting    Hear racing & breaks out into a cold sweat.   Linezolid  Dermatitis    Swelling and rash around eyes   Meropenem Rash    Erythematous, hot, pruritic rash over arms, chest, back, abdomen, and face occurred at the end of meropenem infusion on 02/22/18   Discharge Instructions  Ambulatory referral to Physical Therapy   Complete by: As directed    Call MD for:  difficulty  breathing, headache or visual disturbances   Complete by: As directed    Call MD for:  extreme fatigue   Complete by: As directed    Call MD for:  persistant dizziness or light-headedness   Complete by: As directed    Call MD for:  persistant nausea and vomiting   Complete by: As directed    Call MD for:  redness, tenderness, or signs of infection (pain, swelling, redness, odor or green/yellow discharge around incision site)   Complete by: As directed    Call MD for:  severe uncontrolled pain   Complete by: As directed    Call MD for:  temperature >100.4   Complete by: As directed    Diet - low sodium heart healthy   Complete by: As directed    Diet Carb Modified   Complete by: As directed    Discharge instructions   Complete by: As directed    Follow-up with PCP in 1 week, continue to monitor BP at home and follow with PCP to titrate medication accordingly.  Resumed amlodipine , restart if systolic BP greater than 140 mmHg.   Discharge wound care:   Complete by: As directed    As above   Increase activity slowly   Complete by: As directed        The results of significant diagnostics from this hospitalization (including imaging, microbiology, ancillary and laboratory) are listed below for reference.    Significant Diagnostic Studies: CT PELVIS W CONTRAST Result Date: 11/17/2023 CLINICAL DATA:  Cellulitis. Rash underneath the belly forming a few days ago. Burning. EXAM: CT PELVIS WITH CONTRAST TECHNIQUE: Multidetector CT imaging of the pelvis was performed using the standard protocol following the bolus administration of intravenous contrast. RADIATION DOSE REDUCTION: This exam was performed according to the departmental dose-optimization program which includes automated exposure control, adjustment of the mA and/or kV according to patient size and/or use of iterative reconstruction technique. CONTRAST:  OMNIPAQUE  IOHEXOL  300 MG/ML  SOLN COMPARISON:  CT abdomen and pelvis  01/31/2023 FINDINGS: Urinary Tract:  No abnormality visualized. Bowel:  Unremarkable visualized pelvic bowel loops. Vascular/Lymphatic: No pathologically enlarged lymph nodes. No significant vascular abnormality seen. Reproductive:  No mass or other significant abnormality Other: Scarring in the anterior abdominal wall is likely postoperative. Minimal ventral abdominal wall hernia containing fat. No change. Circumscribed subcutaneous nodule in the anterior abdominal fat to the right of midline measuring 5 cm in maximal diameter. This is unchanged since prior study and may represent sebaceous cyst or old hematoma. Generator pack in the subcutaneous fat to the left of midline posteriorly. No significant soft tissue stranding, skin thickening, or soft tissue gas in the abdominal wall. No CT evidence of acute abscess or infiltration. Musculoskeletal: No suspicious bone lesions identified. IMPRESSION: 1. Chronic postoperative changes in the anterior abdominal wall with small ventral hernia containing fat. Focal nodule or collection to the right of midline in the anterior abdominal wall fat is unchanged since prior study, likely postoperative or sebaceous cyst. 2. No acute infiltration, skin thickening, collection, or gas to suggest cellulitis or soft tissue abscess. The Electronically Signed   By: Elsie Gravely M.D.   On: 11/17/2023 15:56    Microbiology: Recent Results (from the past 240 hours)  Culture, blood (routine x 2)     Status: None   Collection Time: 11/17/23  1:35 PM   Specimen:  BLOOD  Result Value Ref Range Status   Specimen Description BLOOD LEFT ANTECUBITAL  Final   Special Requests   Final    BOTTLES DRAWN AEROBIC AND ANAEROBIC Blood Culture results may not be optimal due to an inadequate volume of blood received in culture bottles   Culture   Final    NO GROWTH 5 DAYS Performed at [facility], [address], [place], [place] [address]    Report Status 11/22/2023 FINAL  Final   Culture, blood (routine x 2)     Status: None   Collection Time: 11/17/23  3:20 PM   Specimen: BLOOD  Result Value Ref Range Status   Specimen Description BLOOD RIGHT ANTECUBITAL  Final   Special Requests   Final    BOTTLES DRAWN AEROBIC AND ANAEROBIC Blood Culture adequate volume   Culture   Final    NO GROWTH 5 DAYS Performed at [facility], [address] [place], [place] [address]    Report Status 11/22/2023 FINAL  Final  MRSA Next Gen by PCR, Nasal     Status: None   Collection Time: 11/17/23 11:57 PM   Specimen: Nasal Mucosa; Nasal Swab  Result Value Ref Range Status   MRSA by PCR Next Gen NOT DETECTED NOT DETECTED Final    Comment: (NOTE) The GeneXpert MRSA Assay (FDA approved for NASAL specimens only), is one component of a comprehensive MRSA colonization surveillance program. It is not intended to diagnose MRSA infection nor to guide or monitor treatment for MRSA infections. Test performance is not FDA approved in patients less than 67 years old. Performed at [facility], [address] [place], [place] [address]      Labs: CBC: Recent Labs  Lab 11/17/23 1326 11/18/23 0837  WBC 8.6 6.9  NEUTROABS 7.1  --   HGB 10.2* 9.1*  HCT 34.2* 31.2*  MCV 75.0* 74.8*  PLT 292 265   Basic Metabolic Panel: Recent Labs  Lab 11/19/23 0456 11/19/23 1430 11/20/23 0540 11/21/23 0515 11/22/23 0401  NA 134* 133* 132* 135 137  K 4.2 4.6 4.4 4.4 4.2  CL 101 101 102 104 107  CO2 24 19* 19* 25 24  GLUCOSE 261* 238* 232* 196* 236*  BUN 17 22 30* 35* 29*  CREATININE 1.13* 1.47* 1.76* 1.61* 1.22*  CALCIUM  8.8* 9.1 8.6* 8.4* 8.5*  MG  --   --  2.1  --   --   PHOS  --   --  5.3*  --   --    Liver Function Tests: Recent Labs  Lab 11/17/23 1326 11/18/23 0837  AST 21 28  ALT 27 28  ALKPHOS 97 81  BILITOT 0.6 0.4  PROT 7.6 6.3*  ALBUMIN  3.2* 2.7*   No results for input(s): LIPASE, AMYLASE in the last 168 hours. No results for input(s):  AMMONIA in the last 168 hours. Cardiac Enzymes: No results for input(s): CKTOTAL, CKMB, CKMBINDEX, TROPONINI in the last 168 hours. BNP (last 3 results) Recent Labs    02/02/23 0551  BNP 34.2   CBG: Recent Labs  Lab 11/21/23 1554 11/21/23 2043 11/21/23 2222 11/22/23 0809 11/22/23 1125  GLUCAP 204* 202* 189* 200* 213*    Time spent: 35 minutes  Signed:  Elvan Sor  [facility] 11/22/2023 2:25 PM

## 2023-11-22 NOTE — Discharge Instructions (Signed)
 [facility]

## 2023-11-22 NOTE — Plan of Care (Signed)
   Problem: Clinical Measurements: Goal: Ability to avoid or minimize complications of infection will improve Outcome: Progressing   Problem: Skin Integrity: Goal: Skin integrity will improve Outcome: Progressing   Problem: Education: Goal: Knowledge of General Education information will improve Description: Including pain rating scale, medication(s)/side effects and non-pharmacologic comfort measures Outcome: Progressing   Problem: Health Behavior/Discharge Planning: Goal: Ability to manage health-related needs will improve Outcome: Progressing   Problem: Clinical Measurements: Goal: Ability to maintain clinical measurements within normal limits will improve Outcome: Progressing Goal: Will remain free from infection Outcome: Progressing Goal: Diagnostic test results will improve Outcome: Progressing Goal: Respiratory complications will improve Outcome: Progressing Goal: Cardiovascular complication will be avoided Outcome: Progressing   Problem: Activity: Goal: Risk for activity intolerance will decrease Outcome: Progressing   Problem: Nutrition: Goal: Adequate nutrition will be maintained Outcome: Progressing   Problem: Coping: Goal: Level of anxiety will decrease Outcome: Progressing   Problem: Elimination: Goal: Will not experience complications related to bowel motility Outcome: Progressing Goal: Will not experience complications related to urinary retention Outcome: Progressing   Problem: Pain Managment: Goal: General experience of comfort will improve and/or be controlled Outcome: Progressing   Problem: Safety: Goal: Ability to remain free from injury will improve Outcome: Progressing   Problem: Skin Integrity: Goal: Risk for impaired skin integrity will decrease Outcome: Progressing

## 2023-11-22 NOTE — Progress Notes (Signed)
 PT Cancellation Note  Patient Details Name: [name] [name] MRN: [id number] DOB: [birth year]   Cancelled Treatment:     Therapist in this am, pt declined PT session stating she is being discharged today and feels comfortable with plan. No DME needs per pt. Will continue per POC if needed or d/c is delayed.   [name] 11/22/2023, 2:09 PM

## 2023-11-24 ENCOUNTER — Telehealth: Payer: Self-pay

## 2023-11-24 DIAGNOSIS — J418 Mixed simple and mucopurulent chronic bronchitis: Secondary | ICD-10-CM

## 2023-11-24 DIAGNOSIS — I10 Essential (primary) hypertension: Secondary | ICD-10-CM

## 2023-11-24 DIAGNOSIS — E119 Type 2 diabetes mellitus without complications: Secondary | ICD-10-CM

## 2023-11-28 ENCOUNTER — Telehealth: Payer: Self-pay

## 2023-11-28 NOTE — Progress Notes (Signed)
 Complex Care Management Note Care Guide Note  11/28/2023 Name: [name] [name] MRN: [id number] DOB: Oct 27, 1960   Complex Care Management Outreach Attempts: An unsuccessful telephone outreach was attempted today to offer the patient information about available complex care management services.  Follow Up Plan:  Additional outreach attempts will be made to offer the patient complex care management information and services.   Encounter Outcome:  No Answer  Dreama [facility]  [facility], [facility] Care Management Assistant Direct Dial: [phone]  Fax: [phone]

## 2023-12-07 NOTE — Progress Notes (Signed)
 Complex Care Management Note Care Guide Note  12/07/2023 Name: [name] [name] MRN: [id number] DOB: 07/20/1960   Complex Care Management Outreach Attempts: A second unsuccessful outreach was attempted today to offer the patient with information about available complex care management services.  Follow Up Plan:  Additional outreach attempts will be made to offer the patient complex care management information and services.   Encounter Outcome:  No Answer  Dreama [facility]  [facility], [facility] VBCI Assistant Direct Dial: [phone]  Fax: [phone]

## 2023-12-09 ENCOUNTER — Ambulatory Visit
Admit: 2023-12-09 | Discharge: 2023-12-10 | Payer: Medicaid (Managed Care) | Attending: Rehabilitative and Restorative Service Providers" | Primary: Rehabilitative and Restorative Service Providers"

## 2023-12-12 NOTE — Progress Notes (Signed)
 Thank you for this referral. [facility] team receives referrals for patients who meet Complex Care Management program criteria: chronic conditions including heart failure, stroke, COPD, ESRD, Sickle Cell, Diabetes with complications, Mental/Behavioral Health diagnosis, substance abuse/misuse and whose Primary Care Provider is a [facility] provider or ACO contracted Building control surveyor in [place]).  This patient does not have a [facility] or [facility] Primary Care Provider. [facility] Population [facility] cannot directly provide Care Management services to patients who do not have a qualifying Primary Care Provider. Please call us  if you need further clarification at [phone].    Dreama [facility]  [facility], [facility] Assistant Direct Dial: [phone]  Fax: [phone]

## 2023-12-16 ENCOUNTER — Other Ambulatory Visit: Payer: Self-pay | Admitting: Psychiatry

## 2023-12-16 DIAGNOSIS — E1165 Type 2 diabetes mellitus with hyperglycemia: Principal | ICD-10-CM

## 2023-12-16 DIAGNOSIS — E039 Hypothyroidism, unspecified: Principal | ICD-10-CM

## 2023-12-16 DIAGNOSIS — M2550 Pain in unspecified joint: Principal | ICD-10-CM

## 2023-12-16 DIAGNOSIS — G2581 Restless legs syndrome: Principal | ICD-10-CM

## 2023-12-16 MED ORDER — SEMAGLUTIDE 0.25 MG OR 0.5 MG (2 MG/3 ML) SUBCUTANEOUS PEN INJECTOR
SUBCUTANEOUS | 1 refills | 28.00000 days | Status: CP
Start: 2023-12-16 — End: ?

## 2023-12-16 MED ORDER — LEVOTHYROXINE 88 MCG TABLET
ORAL_TABLET | Freq: Every day | ORAL | 3 refills | 90.00000 days | Status: CP
Start: 2023-12-16 — End: 2024-12-15

## 2023-12-16 MED ORDER — PRAMIPEXOLE 1 MG TABLET
ORAL_TABLET | Freq: Every evening | ORAL | 1 refills | 90.00000 days | Status: CP
Start: 2023-12-16 — End: 2024-12-15

## 2023-12-16 MED ORDER — PREDNISONE 20 MG TABLET
ORAL_TABLET | Freq: Every day | ORAL | 1 refills | 4.00000 days | Status: CP
Start: 2023-12-16 — End: 2023-12-20

## 2023-12-16 MED ORDER — PRAMIPEXOLE 0.5 MG TABLET
ORAL_TABLET | Freq: Every evening | ORAL | 1 refills | 90.00000 days
Start: 2023-12-16 — End: ?
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
## 2023-12-18 ENCOUNTER — Other Ambulatory Visit: Payer: Self-pay | Admitting: Psychiatry

## 2023-12-18 NOTE — Telephone Encounter (Signed)
 It appears that she was advised to discontinue the medication upon discharge. Please confirm whether she still requires a refill.

## 2023-12-20 ENCOUNTER — Ambulatory Visit: Admit: 2023-12-20 | Discharge: 2023-12-21 | Payer: Medicaid (Managed Care)

## 2023-12-20 DIAGNOSIS — I5030 Unspecified diastolic (congestive) heart failure: Principal | ICD-10-CM

## 2023-12-20 DIAGNOSIS — I480 Paroxysmal atrial fibrillation: Principal | ICD-10-CM

## 2023-12-20 DIAGNOSIS — I5032 Chronic diastolic (congestive) heart failure: Principal | ICD-10-CM

## 2023-12-20 DIAGNOSIS — E1165 Type 2 diabetes mellitus with hyperglycemia: Principal | ICD-10-CM

## 2023-12-20 DIAGNOSIS — N189 Chronic kidney disease, unspecified: Principal | ICD-10-CM

## 2023-12-20 MED ORDER — SPIRONOLACTONE 25 MG TABLET
ORAL_TABLET | Freq: Every day | ORAL | 3 refills | 90.00000 days | Status: CP
Start: 2023-12-20 — End: 2024-12-14

## 2023-12-20 MED ORDER — EMPAGLIFLOZIN 25 MG TABLET
ORAL_TABLET | Freq: Every day | ORAL | 3 refills | 90.00000 days | Status: CP
Start: 2023-12-20 — End: 2024-12-14

## 2023-12-20 MED ORDER — FUROSEMIDE 40 MG TABLET
ORAL_TABLET | Freq: Every day | ORAL | 6 refills | 30.00000 days | Status: CP | PRN
Start: 2023-12-20 — End: 2024-12-14

## 2023-12-20 NOTE — Telephone Encounter (Signed)
 If she was not aware of the discontinuation and has continued taking it, please advise her to continue venlafaxine  75 mg daily unless she experiences any side effects. I double-checked the hospital records, and there was no mention or concern about her being on this medication. However, for some reason, the discharge summary recommended discontinuation, fyi. Let me know if she needs a refill.

## 2023-12-20 NOTE — Telephone Encounter (Signed)
 Spoke to patient she stated that she was not aware that she was suppose to D/C the Venlafaxine  she does not need a refill and she will D/C the medication

## 2023-12-21 ENCOUNTER — Encounter: Payer: Self-pay | Admitting: Psychiatry

## 2023-12-21 ENCOUNTER — Telehealth (INDEPENDENT_AMBULATORY_CARE_PROVIDER_SITE_OTHER): Admitting: Psychiatry

## 2023-12-21 ENCOUNTER — Ambulatory Visit
Admit: 2023-12-21 | Discharge: 2023-12-22 | Payer: Medicaid (Managed Care) | Attending: Student in an Organized Health Care Education/Training Program | Primary: Student in an Organized Health Care Education/Training Program

## 2023-12-21 ENCOUNTER — Ambulatory Visit
Admit: 2023-12-21 | Discharge: 2023-12-22 | Payer: Medicaid (Managed Care) | Attending: Rehabilitative and Restorative Service Providers" | Primary: Rehabilitative and Restorative Service Providers"

## 2023-12-21 DIAGNOSIS — I5032 Chronic diastolic (congestive) heart failure: Principal | ICD-10-CM

## 2023-12-21 DIAGNOSIS — E1165 Type 2 diabetes mellitus with hyperglycemia: Principal | ICD-10-CM

## 2023-12-21 DIAGNOSIS — M8000XK Age-related osteoporosis with current pathological fracture, unspecified site, subsequent encounter for fracture with nonunion: Principal | ICD-10-CM

## 2023-12-21 DIAGNOSIS — E039 Hypothyroidism, unspecified: Principal | ICD-10-CM

## 2023-12-21 DIAGNOSIS — M069 Rheumatoid arthritis, unspecified: Principal | ICD-10-CM

## 2023-12-21 DIAGNOSIS — M818 Other osteoporosis without current pathological fracture: Principal | ICD-10-CM

## 2023-12-21 DIAGNOSIS — I5030 Unspecified diastolic (congestive) heart failure: Principal | ICD-10-CM

## 2023-12-21 DIAGNOSIS — Z794 Long term (current) use of insulin: Principal | ICD-10-CM

## 2023-12-21 DIAGNOSIS — G4700 Insomnia, unspecified: Secondary | ICD-10-CM

## 2023-12-21 DIAGNOSIS — F330 Major depressive disorder, recurrent, mild: Secondary | ICD-10-CM

## 2023-12-21 DIAGNOSIS — G2581 Restless legs syndrome: Secondary | ICD-10-CM

## 2023-12-21 MED ORDER — TRAZODONE HCL 100 MG PO TABS: 100.0000 mg | ORAL_TABLET | Freq: Every evening | ORAL | 0 refills | Status: DC | PRN

## 2023-12-21 MED ORDER — SEMAGLUTIDE 0.25 MG OR 0.5 MG (2 MG/3 ML) SUBCUTANEOUS PEN INJECTOR
SUBCUTANEOUS | 3 refills | 84.00000 days | Status: CP
Start: 2023-12-21 — End: ?

## 2023-12-21 MED ORDER — INSULIN LISPRO (U-100) 100 UNIT/ML SUBCUTANEOUS PEN
3 refills | 0.00000 days | Status: CP
Start: 2023-12-21 — End: ?

## 2023-12-21 MED ORDER — EMPAGLIFLOZIN 25 MG TABLET
ORAL_TABLET | Freq: Every day | ORAL | 3 refills | 90.00000 days | Status: CP
Start: 2023-12-21 — End: 2024-12-15

## 2023-12-21 MED ORDER — PEN NEEDLE, DIABETIC 32 GAUGE X 5/32" (4 MM)
ORAL | 3 refills | 0.00000 days | Status: CP
Start: 2023-12-21 — End: ?

## 2023-12-21 MED ORDER — FREESTYLE LIBRE 3 PLUS SENSOR DEVICE
ORAL | 3 refills | 0.00000 days | Status: CP
Start: 2023-12-21 — End: ?

## 2023-12-21 MED ORDER — INSULIN GLARGINE (U-100) 100 UNIT/ML (3 ML) SUBCUTANEOUS PEN
Freq: Every evening | SUBCUTANEOUS | 3 refills | 90.00000 days | Status: CP
Start: 2023-12-21 — End: 2024-12-20

## 2023-12-21 NOTE — Telephone Encounter (Signed)
 Spoke to patient inform her of the message from provider she voiced understanding and stated that she does need a refill of the Venlafaxine  75 mg

## 2023-12-21 NOTE — Telephone Encounter (Signed)
 Called patient to inform of message from provider no answer left message for patient to return call to office

## 2023-12-28 ENCOUNTER — Ambulatory Visit: Admitting: Pulmonary Disease

## 2024-01-02 DIAGNOSIS — E1165 Type 2 diabetes mellitus with hyperglycemia: Principal | ICD-10-CM

## 2024-01-02 DIAGNOSIS — Z794 Long term (current) use of insulin: Principal | ICD-10-CM

## 2024-01-02 MED ORDER — FREESTYLE LIBRE 3 PLUS SENSOR DEVICE
3 refills | 0.00000 days
Start: 2024-01-02 — End: ?

## 2024-01-03 MED ORDER — FREESTYLE LIBRE 3 PLUS SENSOR DEVICE
3 refills | 0.00000 days
Start: 2024-01-03 — End: ?

## 2024-01-05 ENCOUNTER — Ambulatory Visit: Admit: 2024-01-05 | Discharge: 2024-01-06 | Payer: Medicaid (Managed Care)

## 2024-01-05 DIAGNOSIS — I5030 Unspecified diastolic (congestive) heart failure: Principal | ICD-10-CM

## 2024-01-05 DIAGNOSIS — E1165 Type 2 diabetes mellitus with hyperglycemia: Principal | ICD-10-CM

## 2024-01-05 DIAGNOSIS — I5032 Chronic diastolic (congestive) heart failure: Principal | ICD-10-CM

## 2024-01-05 DIAGNOSIS — Z794 Long term (current) use of insulin: Principal | ICD-10-CM

## 2024-01-05 DIAGNOSIS — Z6841 Body Mass Index (BMI) 40.0 and over, adult: Principal | ICD-10-CM

## 2024-01-05 DIAGNOSIS — I480 Paroxysmal atrial fibrillation: Principal | ICD-10-CM

## 2024-01-05 DIAGNOSIS — E66813 Class 3 severe obesity with body mass index (BMI) of 50.0 to 59.9 in adult, unspecified obesity type, unspecified whether serious comorbidity present (CMS-HCC): Principal | ICD-10-CM

## 2024-01-05 MED ORDER — EMPAGLIFLOZIN 25 MG TABLET
ORAL_TABLET | Freq: Every day | ORAL | 3 refills | 90.00000 days | Status: CP
Start: 2024-01-05 — End: ?

## 2024-01-05 MED ORDER — FUROSEMIDE 40 MG TABLET
ORAL_TABLET | Freq: Every day | ORAL | 6 refills | 30.00000 days | Status: CP | PRN
Start: 2024-01-05 — End: 2024-12-30

## 2024-01-06 DIAGNOSIS — Z794 Long term (current) use of insulin: Principal | ICD-10-CM

## 2024-01-06 DIAGNOSIS — E1165 Type 2 diabetes mellitus with hyperglycemia: Principal | ICD-10-CM

## 2024-01-06 MED ORDER — SPIRONOLACTONE 25 MG TABLET
ORAL_TABLET | Freq: Every day | ORAL | 3 refills | 90.00000 days | Status: CP
Start: 2024-01-06 — End: 2024-12-31

## 2024-01-06 MED ORDER — DEXCOM G7 SENSOR DEVICE
3 refills | 0.00000 days | Status: CP
Start: 2024-01-06 — End: ?

## 2024-01-10 ENCOUNTER — Ambulatory Visit: Admit: 2024-01-10 | Discharge: 2024-01-11 | Payer: Medicaid (Managed Care)

## 2024-01-10 DIAGNOSIS — E1165 Type 2 diabetes mellitus with hyperglycemia: Principal | ICD-10-CM

## 2024-01-10 DIAGNOSIS — G629 Polyneuropathy, unspecified: Principal | ICD-10-CM

## 2024-01-10 MED ORDER — PREGABALIN 100 MG CAPSULE
ORAL_CAPSULE | Freq: Two times a day (BID) | ORAL | 0.00000 days
Start: 2024-01-10 — End: ?

## 2024-01-11 ENCOUNTER — Emergency Department
Admit: 2024-01-11 | Discharge: 2024-01-11 | Disposition: A | Discharge status: Home / Self Care | Payer: Medicaid (Managed Care)

## 2024-01-11 DIAGNOSIS — R739 Hyperglycemia, unspecified: Principal | ICD-10-CM

## 2024-01-12 MED ORDER — PREGABALIN 100 MG CAPSULE
ORAL_CAPSULE | Freq: Two times a day (BID) | ORAL | 0 refills | 30.00000 days | Status: CP
Start: 2024-01-12 — End: ?

## 2024-01-30 ENCOUNTER — Other Ambulatory Visit: Payer: Self-pay | Admitting: Psychiatry

## 2024-01-30 ENCOUNTER — Ambulatory Visit: Admit: 2024-01-30 | Discharge: 2024-01-30 | Payer: Medicaid (Managed Care)

## 2024-01-30 ENCOUNTER — Inpatient Hospital Stay: Admit: 2024-01-30 | Discharge: 2024-01-30 | Payer: Medicaid (Managed Care)

## 2024-01-30 DIAGNOSIS — R300 Dysuria: Principal | ICD-10-CM

## 2024-01-30 DIAGNOSIS — N3000 Acute cystitis without hematuria: Principal | ICD-10-CM

## 2024-01-30 DIAGNOSIS — I10 Essential (primary) hypertension: Principal | ICD-10-CM

## 2024-01-30 DIAGNOSIS — M25569 Pain in unspecified knee: Principal | ICD-10-CM

## 2024-01-30 DIAGNOSIS — M0579 Rheumatoid arthritis with rheumatoid factor of multiple sites without organ or systems involvement: Principal | ICD-10-CM

## 2024-01-30 MED ORDER — NITROFURANTOIN MONOHYDRATE/MACROCRYSTALS 100 MG CAPSULE
ORAL_CAPSULE | Freq: Two times a day (BID) | ORAL | 0 refills | 7.00000 days | Status: CP
Start: 2024-01-30 — End: 2024-02-06

## 2024-01-30 MED ORDER — AMLODIPINE 10 MG TABLET
ORAL_TABLET | Freq: Every day | ORAL | 1 refills | 90.00000 days | Status: CP
Start: 2024-01-30 — End: ?

## 2024-01-30 MED ORDER — METHOTREXATE SODIUM (CONTAINS PRESERVATIVES) 25 MG/ML INJECTION SOLUTION
SUBCUTANEOUS | 3 refills | 84.00000 days | Status: CP
Start: 2024-01-30 — End: 2025-01-29

## 2024-01-30 MED ORDER — METOCLOPRAMIDE 5 MG/5 ML ORAL SOLUTION
3 refills | 0.00000 days
Start: 2024-01-30 — End: ?

## 2024-01-30 MED ORDER — SYRINGE WITH NEEDLE 1 ML 27 X 1/2"
36 refills | 0.00000 days | Status: CP
Start: 2024-01-30 — End: 2025-01-29
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
## 2024-02-06 MED ORDER — METOCLOPRAMIDE 5 MG/5 ML ORAL SOLUTION
3 refills | 0.00000 days
Start: 2024-02-06 — End: ?

## 2024-02-10 ENCOUNTER — Telehealth (INDEPENDENT_AMBULATORY_CARE_PROVIDER_SITE_OTHER): Admitting: Psychiatry

## 2024-02-10 ENCOUNTER — Encounter: Payer: Self-pay | Admitting: Psychiatry

## 2024-02-10 DIAGNOSIS — F330 Major depressive disorder, recurrent, mild: Secondary | ICD-10-CM | POA: Diagnosis not present

## 2024-02-10 DIAGNOSIS — F4310 Post-traumatic stress disorder, unspecified: Secondary | ICD-10-CM

## 2024-02-10 DIAGNOSIS — G4700 Insomnia, unspecified: Secondary | ICD-10-CM | POA: Diagnosis not present

## 2024-02-10 DIAGNOSIS — G2581 Restless legs syndrome: Secondary | ICD-10-CM | POA: Diagnosis not present

## 2024-02-10 MED ORDER — QUETIAPINE FUMARATE 300 MG PO TABS: 300.0000 mg | ORAL_TABLET | Freq: Every day | ORAL | 0 refills | Status: DC

## 2024-02-10 NOTE — Patient Instructions (Signed)
 Hold venlafaxine . Please contact your cardiology clinic regarding the reason of discontinuation Continue Quetiapine  300 mg at night Continue trazodone  100 mg at night as needed for insomnia  Next appointment: 12/11 at 4 30  Please contact your primary care provider for further evaluation and guidance regarding your anemia, restless leg

## 2024-02-15 ENCOUNTER — Ambulatory Visit
Admission: EM | Admit: 2024-02-15 | Discharge: 2024-02-19 | Disposition: A | Discharge status: Home / Self Care | Payer: Medicaid (Managed Care) | Admitting: Student in an Organized Health Care Education/Training Program

## 2024-02-15 ENCOUNTER — Ambulatory Visit: Admit: 2024-02-15 | Discharge: 2024-02-16 | Payer: Medicaid (Managed Care)

## 2024-02-15 ENCOUNTER — Inpatient Hospital Stay
Admission: EM | Admit: 2024-02-15 | Discharge: 2024-02-19 | Disposition: A | Discharge status: Home / Self Care | Payer: Medicaid (Managed Care) | Admitting: Student in an Organized Health Care Education/Training Program

## 2024-02-15 DIAGNOSIS — E1165 Type 2 diabetes mellitus with hyperglycemia: Principal | ICD-10-CM

## 2024-02-17 ENCOUNTER — Ambulatory Visit: Admitting: Pulmonary Disease

## 2024-02-19 MED ORDER — AMOXICILLIN 875 MG TABLET
ORAL_TABLET | Freq: Two times a day (BID) | ORAL | 0 refills | 4.00000 days | Status: CP
Start: 2024-02-19 — End: 2024-02-23

## 2024-02-24 DIAGNOSIS — G629 Polyneuropathy, unspecified: Principal | ICD-10-CM

## 2024-02-24 MED ORDER — PREGABALIN 100 MG CAPSULE
ORAL_CAPSULE | Freq: Two times a day (BID) | ORAL | 2 refills | 30.00000 days | Status: CP
Start: 2024-02-24 — End: ?

## 2024-02-28 NOTE — Telephone Encounter (Signed)
 Spoke with pt and told her to please contact her PCP about her hiatal hernia since there is nothing we can do in clinic or otherwise. NFN.

## 2024-03-05 ENCOUNTER — Ambulatory Visit: Admit: 2024-03-05 | Payer: Medicaid (Managed Care)

## 2024-03-06 ENCOUNTER — Ambulatory Visit
Admission: EM | Admit: 2024-03-06 | Discharge: 2024-03-09 | Disposition: A | Discharge status: Home Health | Payer: Medicaid (Managed Care)

## 2024-03-06 ENCOUNTER — Inpatient Hospital Stay
Admission: EM | Admit: 2024-03-06 | Discharge: 2024-03-09 | Disposition: A | Discharge status: Home Health | Payer: Medicaid (Managed Care)

## 2024-03-09 MED ORDER — HYDROMORPHONE 2 MG TABLET
ORAL_TABLET | Freq: Four times a day (QID) | ORAL | 0 refills | 3.00000 days | Status: CP | PRN
Start: 2024-03-09 — End: 2024-03-14

## 2024-03-09 MED ORDER — AMOXICILLIN 875 MG-POTASSIUM CLAVULANATE 125 MG TABLET
ORAL_TABLET | Freq: Two times a day (BID) | ORAL | 0 refills | 7.00000 days | Status: CP
Start: 2024-03-09 — End: 2024-03-16

## 2024-03-12 ENCOUNTER — Other Ambulatory Visit: Payer: Self-pay | Admitting: Psychiatry

## 2024-03-22 ENCOUNTER — Telehealth: Payer: Self-pay

## 2024-03-22 NOTE — Telephone Encounter (Signed)
 Copied from CRM [id number]. Topic: Medical Record Request - Records Request >> Mar 22, 2024  9:24 AM Isabell A wrote: Reason for CRM:  [facility] calling in regard to needing recent office visit notes with oxygen testing, also requesting notes for tomorrow's visit.   Fax number: [phone]

## 2024-03-23 ENCOUNTER — Ambulatory Visit: Admitting: Pulmonary Disease
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
## 2024-03-26 ENCOUNTER — Encounter: Payer: Self-pay | Admitting: Pulmonary Disease

## 2024-03-26 ENCOUNTER — Ambulatory Visit: Admitting: Pulmonary Disease

## 2024-03-26 DIAGNOSIS — G2581 Restless legs syndrome: Principal | ICD-10-CM

## 2024-03-26 MED ORDER — APIXABAN 5 MG PO TABS: 5.0000 mg | ORAL_TABLET | Freq: Two times a day (BID) | ORAL | 3 refills | Status: AC

## 2024-03-26 MED ORDER — BREZTRI AEROSPHERE 160-9-4.8 MCG/ACT IN AERO: 2.0000 | INHALATION_SPRAY | Freq: Two times a day (BID) | RESPIRATORY_TRACT | 3 refills | Status: AC

## 2024-03-26 MED ORDER — ALBUTEROL SULFATE HFA 108 (90 BASE) MCG/ACT IN AERS: 2.0000 | INHALATION_SPRAY | Freq: Four times a day (QID) | RESPIRATORY_TRACT | 2 refills | Status: AC | PRN

## 2024-03-26 MED ORDER — PRAMIPEXOLE 1 MG TABLET
ORAL_TABLET | Freq: Every evening | ORAL | 3 refills | 90.00000 days
Start: 2024-03-26 — End: 2025-03-26

## 2024-03-26 NOTE — Progress Notes (Unsigned)
 Subjective:    Patient ID: [name] [name], female    DOB: [birth year], 62 y.o.   MRN: [id number]  Patient Care Team: Geralene Levorn ORN, NP as PCP - General Tamea Dedra CROME, MD as Consulting Physician (Pulmonary Disease)  Chief Complaint  Patient presents with   Medical Management of Chronic Issues    Using 2-3L of oxygen, day and night. Shortness of breath on exertion and at rest. No cough or wheezing. Using Breztri  daily, some days forgets the second dose.      BACKGROUND/INTERVAL:  HPI   Review of Systems A 10 point review of systems was performed and it is as noted above otherwise negative.   Patient Active Problem List   Diagnosis Date Noted   Severe hyperglycemia due to diabetes mellitus (HCC) 11/17/2023   Acute gastroenteritis 03/03/2023   Essential hypertension 03/03/2023   Dyslipidemia 03/03/2023   Paroxysmal atrial fibrillation (HCC) 03/03/2023   AKI (acute kidney injury) 03/02/2023   Diabetic infection of left foot (HCC) 02/02/2023   Community acquired pneumonia 02/02/2023   Acute respiratory failure with hypoxia (HCC) 02/01/2023   Multifocal pneumonia 02/01/2023   Acute on chronic diastolic CHF (congestive heart failure) (HCC) 02/01/2023   Acute UTI 01/31/2023   MRSA infection 01/13/2023   Gastroparesis 02/28/2022   Severe sepsis (HCC) 01/04/2022   Depression with anxiety    PAF (paroxysmal atrial fibrillation) (HCC)    Pulmonary fibrosis (HCC) 02/07/2020   Chronic respiratory failure with hypoxia (HCC) 02/07/2020   Anemia 02/07/2020   Morbid obesity with BMI of 50.0-59.9, adult (HCC) 07/08/2019   Controlled type 2 diabetes mellitus without complication, without long-term current use of insulin  (HCC) 04/06/2019   CKD stage 3b, GFR 30-44 ml/min (HCC) 04/06/2019   HLD (hyperlipidemia) 04/06/2019   Abdominal pain 02/12/2019   Diabetic ulcer of left foot (HCC) 02/14/2018   Chronic pain syndrome 03/31/2016   Rheumatoid arthritis (HCC) 12/25/2015    Osteoarthritis, multiple sites 12/24/2015   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[employer]) 12/23/2015   Long term prescription opiate use 12/23/2015   Diabetic peripheral neuropathy (HCC) 12/23/2015   COPD (chronic obstructive pulmonary disease) (HCC) 01/03/2014   Bipolar disorder, unspecified (HCC) 01/03/2014   Borderline personality disorder (HCC) 01/06/2012   Anxiety and depression 01/06/2012   Nausea & vomiting 08/04/2011   Hypothyroidism 06/28/2010   Rheumatoid arthritis involving multiple sites with positive rheumatoid factor (HCC) 06/28/2010   Essential (primary) hypertension 01/27/2005   Major depressive disorder, recurrent episode, moderate (HCC) 06/03/2004    Social History   Tobacco Use   Smoking status: Former    Current packs/day: 0.00    Average packs/day: 2.0 packs/day for 27.0 years (54.0 ttl pk-yrs)    Types: Cigarettes    Start date: 07/29/1972    Quit date: 07/30/1999    Years since quitting: 24.6   Smokeless tobacco: Never   Tobacco comments:    quit in 2001 2-2.5 a day  Substance Use Topics   Alcohol use: No    Allergies  Allergen Reactions   Cefepime  Anaphylaxis and Swelling    lips swell and the break out in blisters.  States lip swelling upon receiving it   Ceftriaxone  Anaphylaxis    Angioedema while admitted at [facility] 01/12/2023 Tolerated amoxicillin     Cephalexin  Hives   Codeine  Palpitations, Nausea Only, Nausea And Vomiting, Rash and Shortness Of Breath    makes heart fly, she gets flushed and passes out   Doxycycline Rash   Propoxyphene Rash and Shortness Of  Breath    Increase heart rate   Sulfa Antibiotics Palpitations, Nausea Only, Shortness Of Breath and Hives    makes heart fly, she gets flushed and passes out   Clindamycin  Rash    Tongue swelling, oral sores, and Mouth rash   Daptomycin  Other (See Comments)    Fever and pulmonary infiltrate   Lovenox  [Enoxaparin  Sodium] Hives   Hydrocodone Nausea And Vomiting     Hear racing & breaks out into a cold sweat.   Linezolid  Dermatitis    Swelling and rash around eyes   Meropenem Rash    Erythematous, hot, pruritic rash over arms, chest, back, abdomen, and face occurred at the end of meropenem infusion on 02/22/18    Current Meds  Medication Sig   acetaminophen  (TYLENOL ) 325 MG tablet Take 2 tablets (650 mg total) by mouth every 6 (six) hours as needed for mild pain or moderate pain (or Fever >/= 101).   albuterol  (VENTOLIN  HFA) 108 (90 Base) MCG/ACT inhaler Inhale 2 puffs into the lungs every 6 (six) hours as needed for wheezing or shortness of breath.   amLODipine  (NORVASC ) 5 MG tablet Take 2 tablets (10 mg total) by mouth daily. Only take it if systolic BP greater than 140 mmHg   apixaban  (ELIQUIS ) 5 MG TABS tablet Take 1 tablet (5 mg total) by mouth 2 (two) times daily.   atorvastatin  (LIPITOR ) 80 MG tablet Take 80 mg by mouth daily.   bisacodyl  (DULCOLAX) 5 MG EC tablet Take 2 tablets (10 mg total) by mouth daily as needed for moderate constipation.   Budeson-Glycopyrrol-Formoterol  (BREZTRI  AEROSPHERE) 160-9-4.8 MCG/ACT AERO Inhale 2 puffs into the lungs in the morning and at bedtime.   famotidine  (PEPCID ) 20 MG tablet Take 1 tablet (20 mg total) by mouth daily.   folic acid  (FOLVITE ) 1 MG tablet Take 1 mg by mouth daily.    hydroxychloroquine  (PLAQUENIL ) 200 MG tablet Take 200 mg by mouth 2 (two) times daily.   insulin  glargine (LANTUS ) 100 unit/mL SOPN Inject 35 Units into the skin.   insulin  lispro (HUMALOG  KWIKPEN) 100 UNIT/ML KwikPen Inject 15 Units into the skin 3 (three) times daily.   iron  polysaccharides (NIFEREX) 150 MG capsule Take 1 capsule (150 mg total) by mouth daily.   JARDIANCE 10 MG TABS tablet Take 10 mg by mouth daily.   levothyroxine  (SYNTHROID , LEVOTHROID) 88 MCG tablet Take 88 mcg by mouth daily before breakfast.   methotrexate  (RHEUMATREX) 2.5 MG tablet Take 15 mg by mouth once a week. Sunday   metoCLOPramide  (REGLAN ) 5 MG/5ML  solution Take 5 mg by mouth daily.   metoprolol  tartrate (LOPRESSOR ) 25 MG tablet Take 25 mg by mouth 2 (two) times daily.   nystatin  (MYCOSTATIN /NYSTOP ) powder Apply 1 Application topically 2 (two) times daily. Apply 30 min to 1 hour after clotrimazole  cream   ondansetron  (ZOFRAN -ODT) 4 MG disintegrating tablet Take 4 mg by mouth every 8 (eight) hours as needed.   polyethylene glycol powder (GLYCOLAX /MIRALAX ) 17 GM/SCOOP powder Take 17 g by mouth 2 (two) times daily. Mix as directed.   pramipexole  (MIRAPEX ) 0.5 MG tablet Take 0.5 mg by mouth at bedtime.   pregabalin  (LYRICA ) 100 MG capsule Take 1 capsule (100 mg total) by mouth 3 (three) times daily.   promethazine  (PHENERGAN ) 12.5 MG tablet Take 12.5 mg by mouth.   QUEtiapine  (SEROQUEL ) 300 MG tablet Take 1 tablet (300 mg total) by mouth at bedtime.   TRADJENTA  5 MG TABS tablet Take 5 mg by mouth daily.  traZODone  (DESYREL ) 100 MG tablet Take 1 tablet (100 mg total) by mouth at bedtime as needed for sleep.    Immunization History  Administered Date(s) Administered   Influenza, Seasonal, Injecte, Preservative Fre 02/04/2005, 02/23/2023   Influenza,inj,Quad PF,6+ Mos 04/14/2015, 03/23/2017, 02/07/2018, 02/01/2019, 02/07/2020, 02/08/2021, 03/22/2022   Influenza-Unspecified 02/02/2019   Moderna Covid-19 Fall Seasonal Vaccine 86yrs & older 03/22/2022   PFIZER(Purple Top)SARS-COV-2 Vaccination 03/18/2020, 04/08/2020, 06/03/2020   Pfizer Covid-19 Vaccine Bivalent Booster 77yrs & up 07/13/2021   Pneumococcal Conjugate-13 04/22/2014   Pneumococcal Polysaccharide-23 03/23/2017, 04/16/2019        Objective:     Vitals:   03/26/24 1438  BP: 122/70  Pulse: 80  Temp: 97.6 F (36.4 C)  Height: 5' 6 (1.676 m)  Weight: (!) 355 lb (161 kg)  SpO2: 98% Comment: 3L oxygen  TempSrc: Temporal  BMI (Calculated): 57.33     SpO2: 98 % (3L oxygen)  GENERAL: HEAD: Normocephalic, atraumatic.  EYES: Pupils equal, round, reactive to light.  No  scleral icterus.  MOUTH:  NECK: Supple. No thyromegaly. Trachea midline. No JVD.  No adenopathy. PULMONARY: Good air entry bilaterally.  No adventitious sounds. CARDIOVASCULAR: S1 and S2. Regular rate and rhythm.  ABDOMEN: MUSCULOSKELETAL: No joint deformity, no clubbing, no edema.  NEUROLOGIC:  SKIN: Intact,warm,dry. PSYCH:  Ambulatory oxymetry was performed today:  At rest on room air oxygen saturation was XXX, the patient ambulated at a XXX pace, completed XXX laps, O2 nadirXXX, XXX shortness of breath.  Resting heart rate was XXX bpm at maximum for this exercise XXX bpm.       Assessment & Plan:   No diagnosis found.  No orders of the defined types were placed in this encounter.   No orders of the defined types were placed in this encounter.     Advised if symptoms do not improve or worsen, to please contact office for sooner follow up or seek emergency care.    I spent xxx minutes of dedicated to the care of this patient on the date of this encounter to include pre-visit review of records, face-to-face time with the patient discussing conditions above, post visit ordering of testing, clinical documentation with the electronic health record, making appropriate referrals as documented, and communicating necessary findings to members of the patients care team.     C. Leita Sanders, MD Advanced Bronchoscopy PCCM [facility]    *This note was generated using voice recognition software/Dragon and/or AI transcription program.  Despite best efforts to proofread, errors can occur which can change the meaning. Any transcriptional errors that result from this process are unintentional and may not be fully corrected at the time of dictation.

## 2024-03-26 NOTE — Patient Instructions (Signed)
 VISIT SUMMARY:  [name] [name], you had a follow-up appointment today to review your pulmonary condition. We discussed your need for continuous oxygen therapy and your use of inhalers for managing your chronic obstructive pulmonary disease (COPD).  YOUR PLAN:  -CHRONIC RESPIRATORY FAILURE REQUIRING CONTINUOUS OXYGEN THERAPY: Chronic respiratory failure means your lungs are not able to get enough oxygen into your blood. You need to continue using your oxygen therapy all the time, including at night.  -CHRONIC OBSTRUCTIVE PULMONARY DISEASE (COPD): COPD is a long-term lung disease that makes it hard to breathe. We have sent prescriptions for your inhalers to the pharmacy for a three-month supply to help manage your symptoms.  INSTRUCTIONS:  Please continue using your oxygen therapy continuously, including at night. Your inhaler prescriptions have been sent to [facility] and [facility] pharmacies for a three-month supply. Follow up with us  if you have any issues or concerns.

## 2024-03-27 ENCOUNTER — Other Ambulatory Visit (HOSPITAL_COMMUNITY): Payer: Self-pay

## 2024-03-27 ENCOUNTER — Telehealth: Payer: Self-pay

## 2024-03-27 MED ORDER — METOCLOPRAMIDE 5 MG/5 ML ORAL SOLUTION
3 refills | 0.00000 days
Start: 2024-03-27 — End: ?

## 2024-03-27 NOTE — Telephone Encounter (Signed)
*  Pulm  Pharmacy Patient Advocate Encounter   Received notification from Fax that prior authorization for Breztri  Aerosphere 160-9-4.8MCG/ACT aerosol   is required/requested.   Insurance verification completed.   The patient is insured through [employer].   Per test claim: PA required; PA started via CoverMyMeds. KEY [id number] . Waiting for clinical questions to populate.

## 2024-03-29 ENCOUNTER — Telehealth (INDEPENDENT_AMBULATORY_CARE_PROVIDER_SITE_OTHER): Admitting: Psychiatry

## 2024-03-29 ENCOUNTER — Encounter: Payer: Self-pay | Admitting: Psychiatry

## 2024-03-29 DIAGNOSIS — G4700 Insomnia, unspecified: Secondary | ICD-10-CM | POA: Diagnosis not present

## 2024-03-29 DIAGNOSIS — F4310 Post-traumatic stress disorder, unspecified: Secondary | ICD-10-CM | POA: Diagnosis not present

## 2024-03-29 DIAGNOSIS — F3341 Major depressive disorder, recurrent, in partial remission: Secondary | ICD-10-CM

## 2024-03-29 DIAGNOSIS — G2581 Restless legs syndrome: Secondary | ICD-10-CM

## 2024-03-29 MED ORDER — TRAZODONE HCL 100 MG PO TABS: 100.0000 mg | ORAL_TABLET | Freq: Every evening | ORAL | 1 refills | Status: AC | PRN

## 2024-03-30 NOTE — Telephone Encounter (Signed)
 Trial and failure of two preferred alternatives required:   Advair HFA/Diskus Symbicort Dulera

## 2024-03-30 NOTE — Telephone Encounter (Signed)
 Patient has been on Breztri  since 2023 and stable on the same.  It would be detrimental to change medication.  She has failed DuoNeb and Pulmicort , Stiolto, and Advair in the past.  Symbicort and Dulera would be similar to Advair.  Recommend that she continue Breztri  as she is well compensated in this regard and has been on the medication since 2023.

## 2024-04-01 ENCOUNTER — Other Ambulatory Visit: Payer: Self-pay | Admitting: Psychiatry

## 2024-04-02 ENCOUNTER — Ambulatory Visit: Admit: 2024-04-02 | Discharge: 2024-04-02 | Payer: Medicaid (Managed Care) | Attending: Podiatrist | Primary: Podiatrist

## 2024-04-02 ENCOUNTER — Inpatient Hospital Stay: Admit: 2024-04-02 | Discharge: 2024-04-02 | Payer: Medicaid (Managed Care)

## 2024-04-02 ENCOUNTER — Inpatient Hospital Stay
Admission: EM | Admit: 2024-04-02 | Discharge: 2024-04-18 | Disposition: A | Discharge status: Skilled Nursing Facility | Payer: Medicaid (Managed Care) | Source: Other Acute Inpatient Hospital

## 2024-04-02 ENCOUNTER — Ambulatory Visit: Admit: 2024-04-02 | Payer: Medicaid (Managed Care)

## 2024-04-02 DIAGNOSIS — M84371A Stress fracture, right ankle, initial encounter for fracture: Principal | ICD-10-CM

## 2024-04-02 MED ORDER — PRAMIPEXOLE 1 MG TABLET
ORAL_TABLET | Freq: Every evening | ORAL | 3 refills | 90.00000 days | Status: CP
Start: 2024-04-02 — End: 2025-04-02

## 2024-04-02 NOTE — Telephone Encounter (Signed)
 Sent to plan with this additional information under new Key: [id number]

## 2024-04-02 NOTE — Telephone Encounter (Signed)
 Your request has been approved Approved. Approved for BREZTRI  AEROSPHERE Aerosol 160;9;4.8MCG/ACT, quantity up to 32.1 per 90 days, under the pharmacy benefit. The drug has been approved from 04/02/2024 to 04/02/2025. Generic or biosimilar substitution may be required when available and preferred on the formulary. Please note that dispensing of non-maintenance and specialty medications may be limited to a monthly supply. Authorization Expiration12/15/2026

## 2024-04-03 MED ORDER — METOCLOPRAMIDE 5 MG/5 ML ORAL SOLUTION
3 refills | 0.00000 days | Status: SS
Start: 2024-04-03 — End: ?

## 2024-04-11 MED ORDER — INSULIN GLARGINE (U-100) 100 UNIT/ML (3 ML) SUBCUTANEOUS PEN
Freq: Two times a day (BID) | SUBCUTANEOUS | 0 refills | 90.00000 days
Start: 2024-04-11 — End: ?

## 2024-04-11 MED ORDER — ATORVASTATIN 80 MG TABLET
ORAL_TABLET | Freq: Every day | ORAL | 3 refills | 90.00000 days
Start: 2024-04-11 — End: ?

## 2024-04-13 MED ORDER — FERROUS SULFATE 325 MG (65 MG IRON) TABLET
ORAL_TABLET | ORAL | 0 refills | 30.00000 days
Start: 2024-04-13 — End: ?

## 2024-04-18 MED ORDER — FERROUS SULFATE 325 MG (65 MG IRON) TABLET
ORAL_TABLET | ORAL | 0 refills | 30.00000 days | Status: CP
Start: 2024-04-18 — End: ?

## 2024-04-18 MED ORDER — INSULIN GLARGINE (U-100) 100 UNIT/ML (3 ML) SUBCUTANEOUS PEN
Freq: Two times a day (BID) | SUBCUTANEOUS | 0 refills | 90.00000 days
Start: 2024-04-18 — End: 2024-07-17

## 2024-04-18 MED ORDER — ATORVASTATIN 80 MG TABLET
ORAL_TABLET | Freq: Every day | ORAL | 3 refills | 90.00000 days
Start: 2024-04-18 — End: ?

## 2024-04-21 DIAGNOSIS — G629 Polyneuropathy, unspecified: Principal | ICD-10-CM

## 2024-04-21 MED ORDER — PREGABALIN 100 MG CAPSULE
ORAL_CAPSULE | Freq: Two times a day (BID) | ORAL | 2 refills | 30.00000 days | Status: CP
Start: 2024-04-21 — End: 2024-05-21

## 2024-04-27 ENCOUNTER — Ambulatory Visit: Admitting: Pulmonary Disease

## 2024-05-01 ENCOUNTER — Other Ambulatory Visit: Payer: Self-pay | Admitting: Psychiatry

## 2024-05-09 DIAGNOSIS — E1165 Type 2 diabetes mellitus with hyperglycemia: Principal | ICD-10-CM

## 2024-05-09 DIAGNOSIS — Z794 Long term (current) use of insulin: Principal | ICD-10-CM

## 2024-05-09 MED ORDER — SEMAGLUTIDE 0.25 MG OR 0.5 MG (2 MG/3 ML) SUBCUTANEOUS PEN INJECTOR
SUBCUTANEOUS | 3 refills | 84.00000 days | Status: CN
Start: 2024-05-09 — End: ?

## 2024-05-14 ENCOUNTER — Other Ambulatory Visit: Payer: Self-pay | Admitting: Psychiatry

## 2024-05-15 ENCOUNTER — Ambulatory Visit
Admit: 2024-05-15 | Payer: Medicaid (Managed Care) | Attending: Student in an Organized Health Care Education/Training Program | Primary: Student in an Organized Health Care Education/Training Program

## 2024-05-16 ENCOUNTER — Encounter: Admit: 2024-05-16 | Discharge: 2024-05-16 | Payer: Medicaid (Managed Care) | Attending: Family | Primary: Family

## 2024-05-16 DIAGNOSIS — R399 Unspecified symptoms and signs involving the genitourinary system: Principal | ICD-10-CM

## 2024-05-16 MED ORDER — NITROFURANTOIN MONOHYDRATE/MACROCRYSTALS 100 MG CAPSULE
ORAL_CAPSULE | Freq: Two times a day (BID) | ORAL | 0 refills | 5.00000 days | Status: CP
Start: 2024-05-16 — End: 2024-05-21

## 2024-05-16 MED ORDER — PROCHLORPERAZINE MALEATE 10 MG TABLET
ORAL_TABLET | Freq: Three times a day (TID) | ORAL | 0 refills | 10.00000 days | Status: CP | PRN
Start: 2024-05-16 — End: ?

## 2024-05-18 ENCOUNTER — Encounter: Payer: Self-pay | Admitting: Psychiatry

## 2024-05-18 ENCOUNTER — Telehealth: Admitting: Psychiatry

## 2024-05-18 DIAGNOSIS — F4310 Post-traumatic stress disorder, unspecified: Secondary | ICD-10-CM

## 2024-05-18 DIAGNOSIS — F3341 Major depressive disorder, recurrent, in partial remission: Secondary | ICD-10-CM | POA: Diagnosis not present

## 2024-05-18 DIAGNOSIS — G4700 Insomnia, unspecified: Secondary | ICD-10-CM | POA: Diagnosis not present

## 2024-05-18 MED ORDER — QUETIAPINE FUMARATE 300 MG PO TABS: 300.0000 mg | ORAL_TABLET | Freq: Every day | ORAL | 1 refills | Status: AC

## 2024-05-18 MED ORDER — VENLAFAXINE HCL ER 150 MG PO CP24: 150.0000 mg | ORAL_CAPSULE | Freq: Every day | ORAL | 1 refills | Status: AC

## 2024-07-06 ENCOUNTER — Telehealth: Admitting: Psychiatry

## 2024-07-26 ENCOUNTER — Ambulatory Visit: Admitting: Pulmonary Disease
